# Patient Record
Sex: Female | Born: 1970
Health system: Southern US, Community
[De-identification: ages and names within clinical notes are randomized; demographics above are authoritative.]

## PROBLEM LIST (undated history)

## (undated) DIAGNOSIS — M2550 Pain in unspecified joint: Secondary | ICD-10-CM

## (undated) DIAGNOSIS — E669 Obesity, unspecified: Secondary | ICD-10-CM

## (undated) DIAGNOSIS — I10 Essential (primary) hypertension: Secondary | ICD-10-CM

## (undated) DIAGNOSIS — E559 Vitamin D deficiency, unspecified: Secondary | ICD-10-CM

## (undated) DIAGNOSIS — C569 Malignant neoplasm of unspecified ovary: Secondary | ICD-10-CM

## (undated) DIAGNOSIS — I639 Cerebral infarction, unspecified: Secondary | ICD-10-CM

## (undated) DIAGNOSIS — F32A Depression, unspecified: Secondary | ICD-10-CM

## (undated) DIAGNOSIS — G8190 Hemiplegia, unspecified affecting unspecified side: Secondary | ICD-10-CM

## (undated) DIAGNOSIS — F419 Anxiety disorder, unspecified: Secondary | ICD-10-CM

## (undated) DIAGNOSIS — T7840XA Allergy, unspecified, initial encounter: Secondary | ICD-10-CM

## (undated) DIAGNOSIS — Z5189 Encounter for other specified aftercare: Secondary | ICD-10-CM

## (undated) DIAGNOSIS — G709 Myoneural disorder, unspecified: Secondary | ICD-10-CM

## (undated) DIAGNOSIS — F329 Major depressive disorder, single episode, unspecified: Secondary | ICD-10-CM

## (undated) DIAGNOSIS — J189 Pneumonia, unspecified organism: Secondary | ICD-10-CM

## (undated) DIAGNOSIS — M549 Dorsalgia, unspecified: Secondary | ICD-10-CM

## (undated) DIAGNOSIS — R7303 Prediabetes: Secondary | ICD-10-CM

## (undated) DIAGNOSIS — M1990 Unspecified osteoarthritis, unspecified site: Secondary | ICD-10-CM

## (undated) DIAGNOSIS — G4730 Sleep apnea, unspecified: Secondary | ICD-10-CM

## (undated) DIAGNOSIS — Z91018 Allergy to other foods: Secondary | ICD-10-CM

## (undated) DIAGNOSIS — E785 Hyperlipidemia, unspecified: Secondary | ICD-10-CM

## (undated) DIAGNOSIS — D649 Anemia, unspecified: Secondary | ICD-10-CM

## (undated) DIAGNOSIS — E119 Type 2 diabetes mellitus without complications: Secondary | ICD-10-CM

## (undated) DIAGNOSIS — G43909 Migraine, unspecified, not intractable, without status migrainosus: Secondary | ICD-10-CM

## (undated) HISTORY — DX: Anemia, unspecified: D64.9

## (undated) HISTORY — DX: Anxiety disorder, unspecified: F41.9

## (undated) HISTORY — DX: Unspecified osteoarthritis, unspecified site: M19.90

## (undated) HISTORY — DX: Sleep apnea, unspecified: G47.30

## (undated) HISTORY — DX: Allergy, unspecified, initial encounter: T78.40XA

## (undated) HISTORY — PX: INGUINAL HERNIA REPAIR: SHX194

## (undated) HISTORY — DX: Dorsalgia, unspecified: M54.9

## (undated) HISTORY — DX: Encounter for other specified aftercare: Z51.89

## (undated) HISTORY — DX: Pain in unspecified joint: M25.50

## (undated) HISTORY — DX: Malignant neoplasm of unspecified ovary: C56.9

## (undated) HISTORY — DX: Obesity, unspecified: E66.9

## (undated) HISTORY — PX: HERNIA REPAIR: SHX51

## (undated) HISTORY — DX: Essential (primary) hypertension: I10

## (undated) HISTORY — DX: Allergy to other foods: Z91.018

## (undated) HISTORY — DX: Prediabetes: R73.03

## (undated) HISTORY — PX: UMBILICAL HERNIA REPAIR: SHX196

## (undated) HISTORY — DX: Depression, unspecified: F32.A

## (undated) HISTORY — DX: Vitamin D deficiency, unspecified: E55.9

## (undated) HISTORY — DX: Hemiplegia, unspecified affecting unspecified side: G81.90

## (undated) HISTORY — DX: Myoneural disorder, unspecified: G70.9

## (undated) HISTORY — DX: Migraine, unspecified, not intractable, without status migrainosus: G43.909

## (undated) HISTORY — DX: Type 2 diabetes mellitus without complications: E11.9

## (undated) HISTORY — DX: Cerebral infarction, unspecified: I63.9

## (undated) HISTORY — DX: Pneumonia, unspecified organism: J18.9

---
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
## 1999-05-09 ENCOUNTER — Emergency Department (HOSPITAL_COMMUNITY): Admission: EM | Admit: 1999-05-09 | Discharge: 1999-05-09 | Payer: Self-pay | Admitting: Emergency Medicine

## 1999-07-21 ENCOUNTER — Encounter: Payer: Self-pay | Admitting: Internal Medicine

## 1999-07-21 ENCOUNTER — Emergency Department (HOSPITAL_COMMUNITY): Admission: EM | Admit: 1999-07-21 | Discharge: 1999-07-21 | Payer: Self-pay | Admitting: Internal Medicine

## 2000-11-11 ENCOUNTER — Emergency Department (HOSPITAL_COMMUNITY): Admission: EM | Admit: 2000-11-11 | Discharge: 2000-11-11 | Payer: Self-pay | Admitting: Emergency Medicine

## 2001-05-29 ENCOUNTER — Emergency Department (HOSPITAL_COMMUNITY): Admission: EM | Admit: 2001-05-29 | Discharge: 2001-05-29 | Payer: Self-pay | Admitting: Emergency Medicine

## 2003-02-04 ENCOUNTER — Encounter: Payer: Self-pay | Admitting: Emergency Medicine

## 2003-02-04 ENCOUNTER — Inpatient Hospital Stay (HOSPITAL_COMMUNITY): Admission: EM | Admit: 2003-02-04 | Discharge: 2003-02-05 | Payer: Self-pay | Admitting: Emergency Medicine

## 2003-09-25 ENCOUNTER — Emergency Department (HOSPITAL_COMMUNITY): Admission: AD | Admit: 2003-09-25 | Discharge: 2003-09-25 | Payer: Self-pay | Admitting: Family Medicine

## 2003-12-18 ENCOUNTER — Emergency Department (HOSPITAL_COMMUNITY): Admission: EM | Admit: 2003-12-18 | Discharge: 2003-12-18 | Payer: Self-pay | Admitting: Emergency Medicine

## 2005-06-08 ENCOUNTER — Inpatient Hospital Stay (HOSPITAL_COMMUNITY): Admission: AD | Admit: 2005-06-08 | Discharge: 2005-06-08 | Payer: Self-pay | Admitting: Obstetrics and Gynecology

## 2005-06-08 IMAGING — US US TRANSVAGINAL NON-OB
1 series · 13 of 25 positions shown · non-contrast
Comparison: none

CLINICAL DATA: Enlarged uterus with pain and vaginal discharge.  LMP [DATE].
TRANSABDOMINAL AND ENDOVAGINAL PELVIC ULTRASOUND:
Multiple images of the uterus and adnexa were obtained using a transabdominal and endovaginal approaches. 
The uterus demonstrates a straight anterior/posterior position on endovaginal exam making evaluation of the uterus transvaginally suboptimal.  
Transabdominal evaluation of the uterus reveals a sagittal length of 13.5, AP width of 6.5 cm and a transverse width of 6.3 cm.  The upper uterine segment portion of the uterus appears somewhat bulbous and the myometrium appears diffusely inhomogeneous.  These findings suggest the possibility of underlying adenomyosis.  No discrete measurable areas of altered echotexture was seen transabdominally to suggest fibroids.  Again, the myometrium was poorly assessed endovaginally.  
The endometrial canal is homogeneously echogenic with an AP width of 8 mm.  No focal thickening or inhomogeniety was seen transabdominally and the appearance would correlate with a presecretory stripe and the patient?s given LMP of [DATE].  The endometrium could not be assessed endovaginally.  
Both ovaries were well-seen transabdominally and have a normal appearance with the right ovary measuring 3.2 x 1.4 x 1.9 cm and the left ovary measuring 3.2 x 1.7 x 2.6 cm.  A physiologic amount of simple free fluid was noted in the cul-de-sac.  No separate adnexal masses are noted.  
Because of the prominent uterine size, both kidneys were evaluated and these demonstrate no signs of associated hydronephrosis.

[Series 1: us transvaginal non-ob · 0.33mm/px · 13 of 47 slices shown]
[im 1/47]
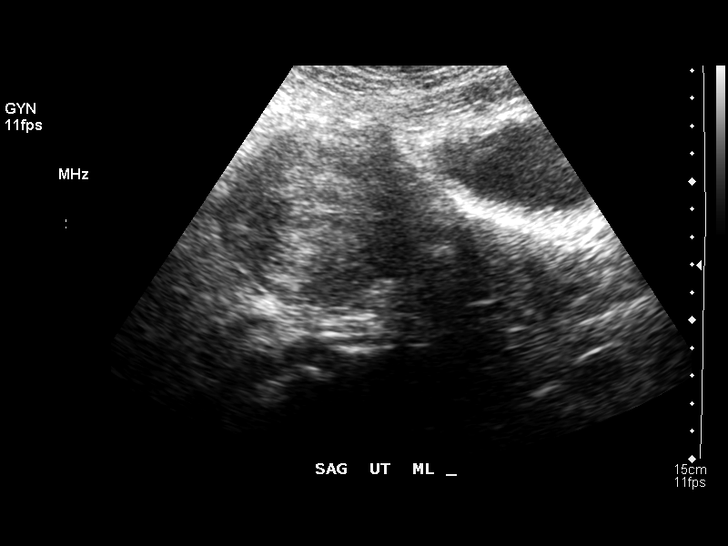
[im 4/47]
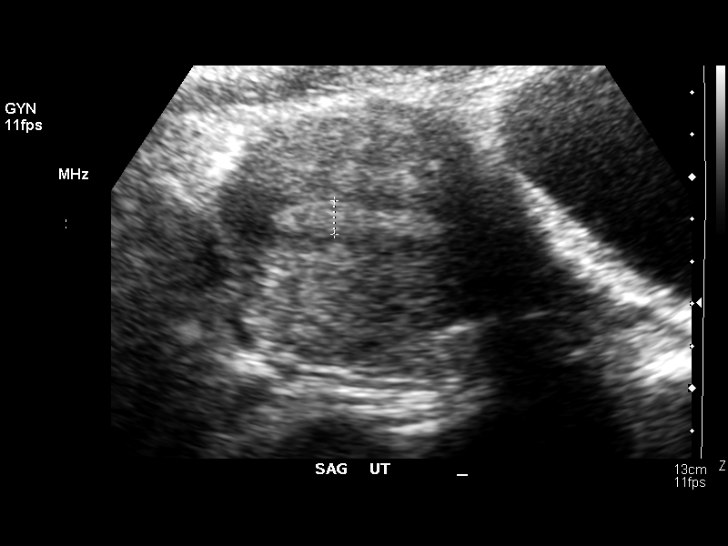
[im 8/47]
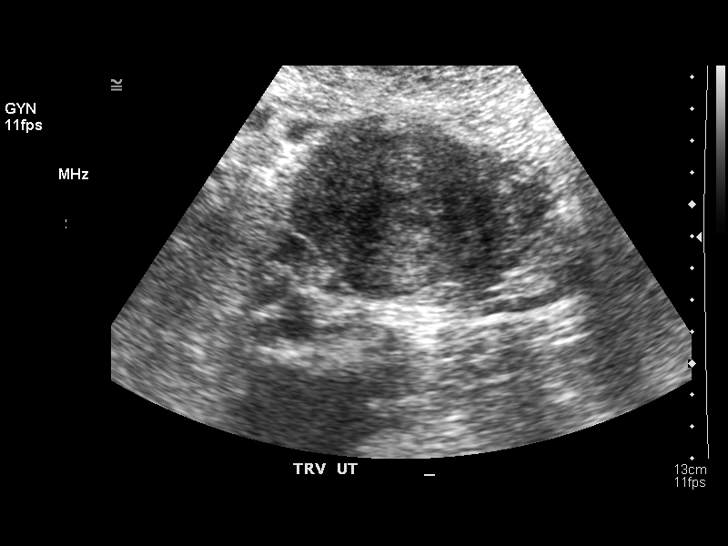
[im 12/47]
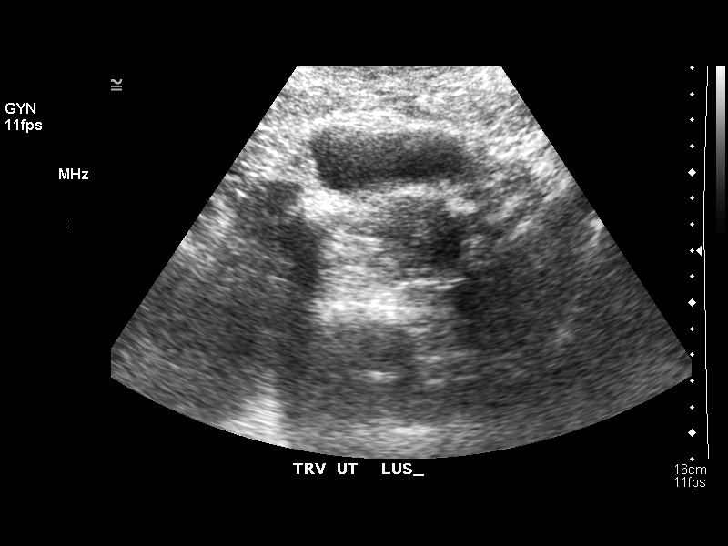
[im 16/47]
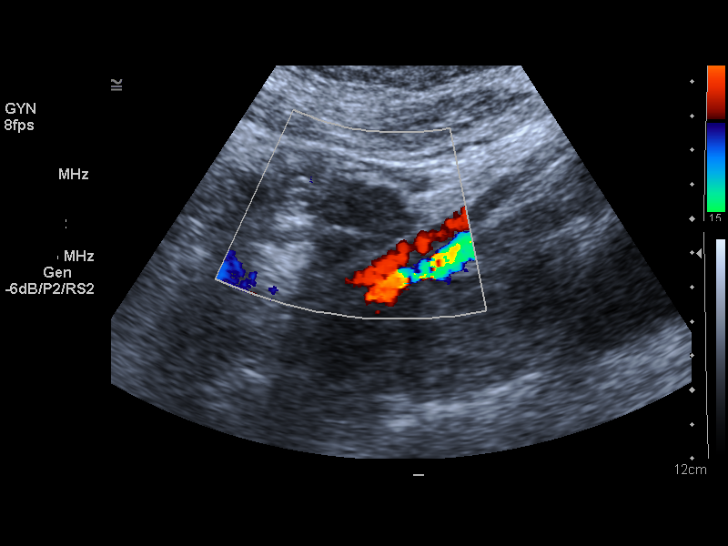
[im 20/47]
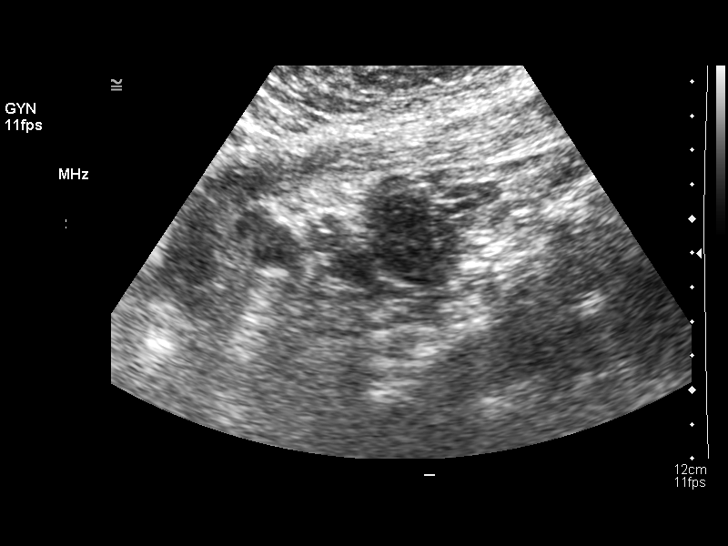
[im 24/47]
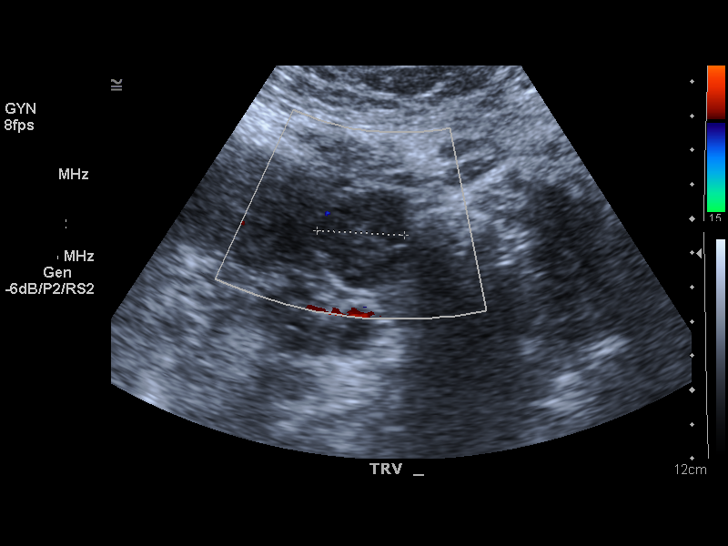
[im 27/47]
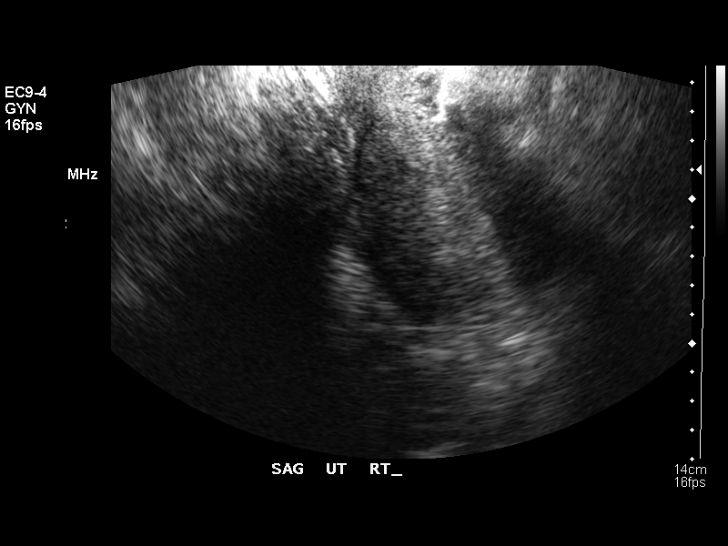
[im 31/47]
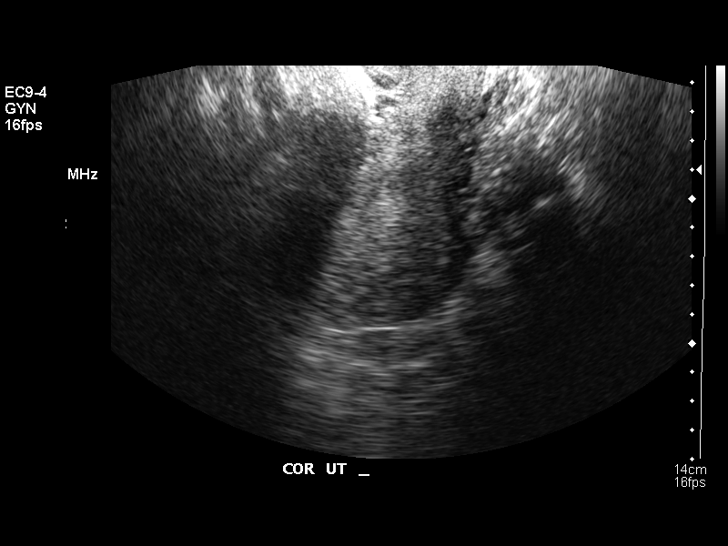
[im 35/47]
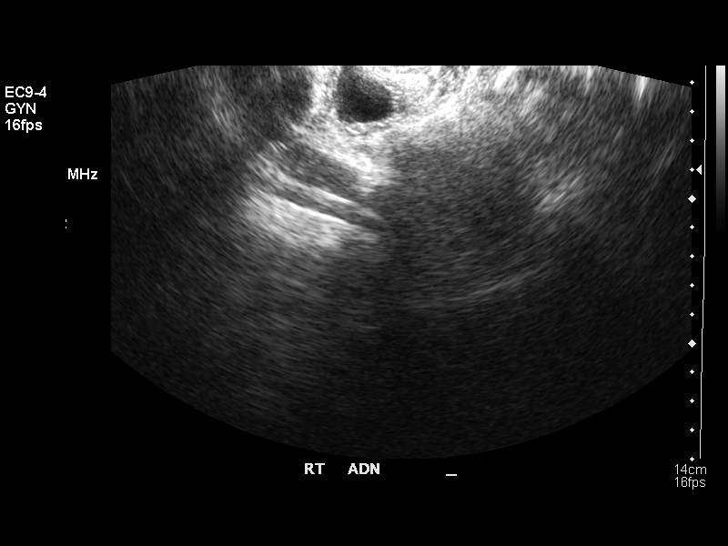
[im 39/47]
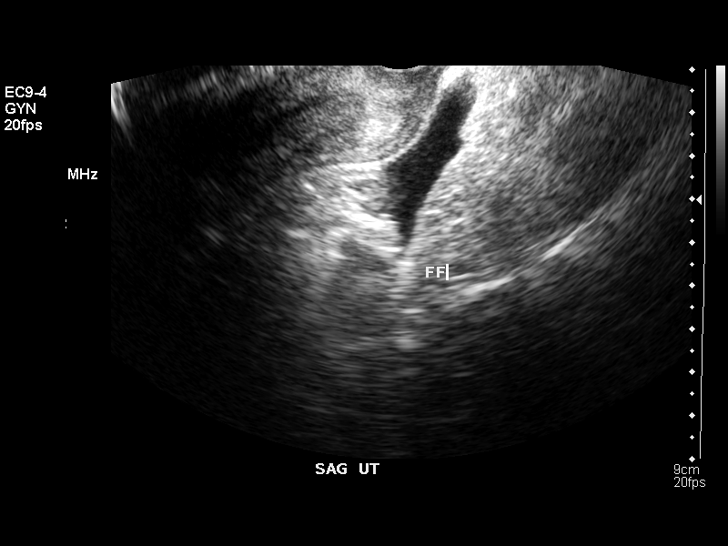
[im 43/47]
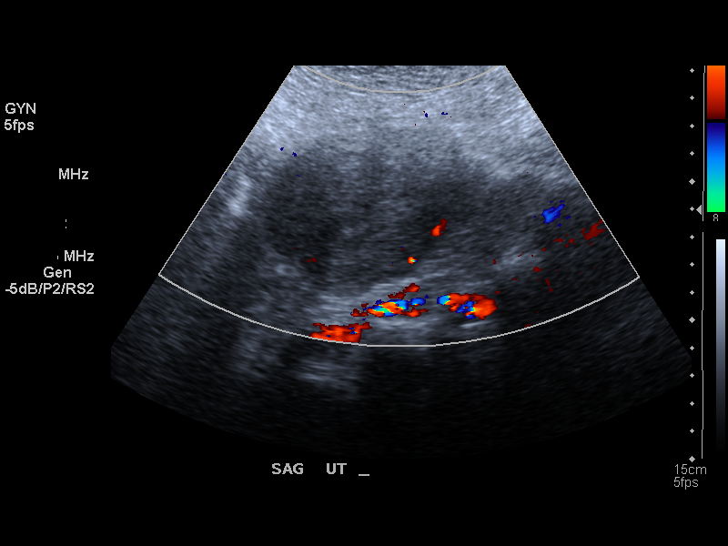
[im 47/47]
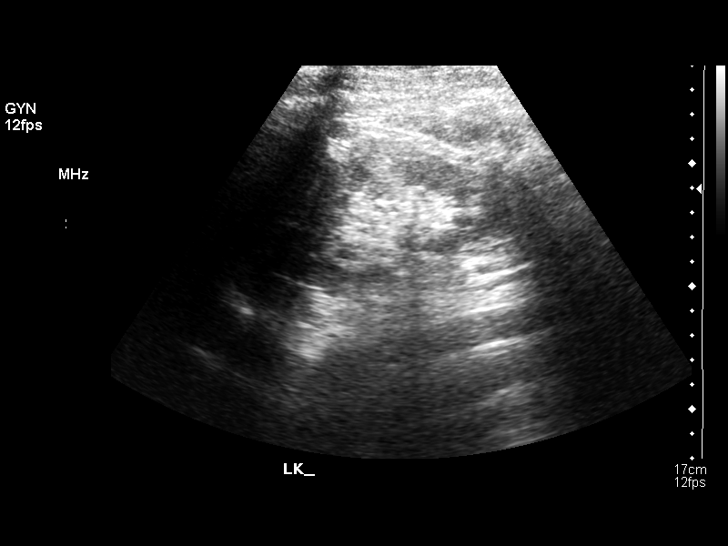

[13 of 25 positions shown; findings below may reference images not displayed]

IMPRESSION: 1.  Enlarged uterus with transabdominal findings raising the possibility of adenomyosis. 
2.  Normal endometrium and ovaries identified transabdominally.  The endovaginal exam was suboptimal due to positioning of the uterus on endovaginal exam.

## 2005-07-02 ENCOUNTER — Ambulatory Visit: Payer: Self-pay | Admitting: Obstetrics and Gynecology

## 2005-07-24 ENCOUNTER — Inpatient Hospital Stay (HOSPITAL_COMMUNITY): Admission: AD | Admit: 2005-07-24 | Discharge: 2005-07-24 | Payer: Self-pay | Admitting: Obstetrics and Gynecology

## 2005-07-24 IMAGING — US US OB COMP LESS 14 WK
1 series · 14 of 28 positions shown · non-contrast
Comparison: [DATE].

CLINICAL DATA: Abdominal pain. Positive urine pregnancy test. Beta-hCG pending.

COMPLETE OBSTETRICAL ULTRASOUND LESS THAN 14 WEEKS AND TRANSVAGINAL OBSTETRICAL
ULTRASOUND

[Series 1: us ob comp less 14 wk · 0.29mm/px · 14 of 28 slices shown]
[im 2/28]
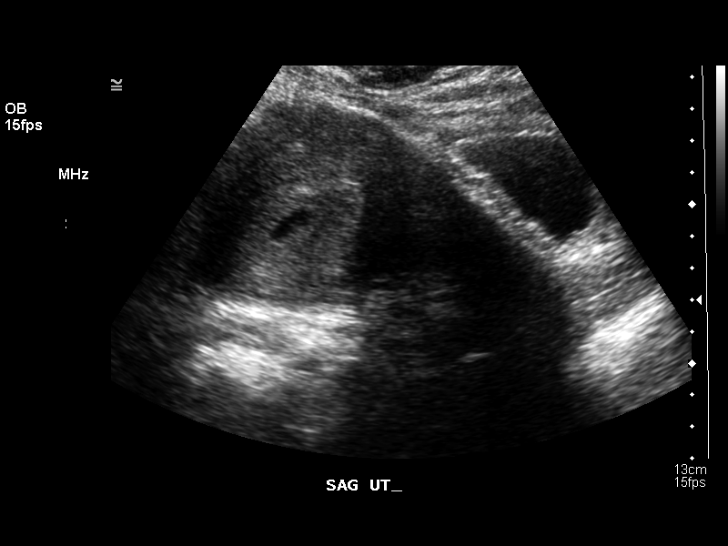
[im 4/28]
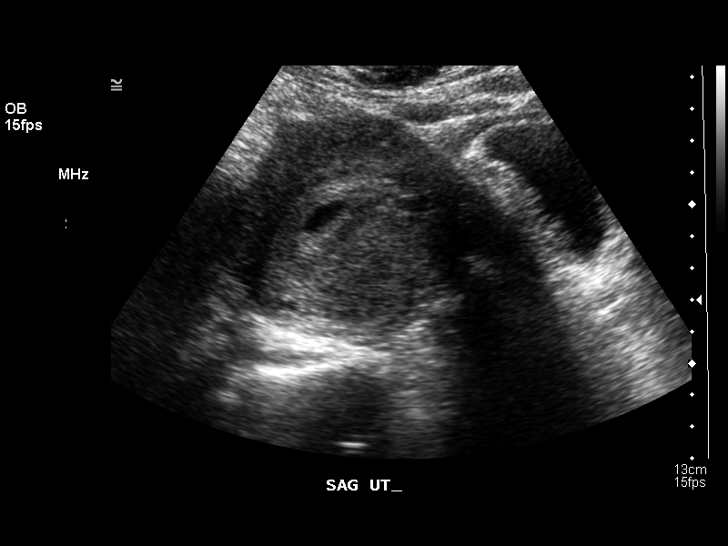
[im 6/28]
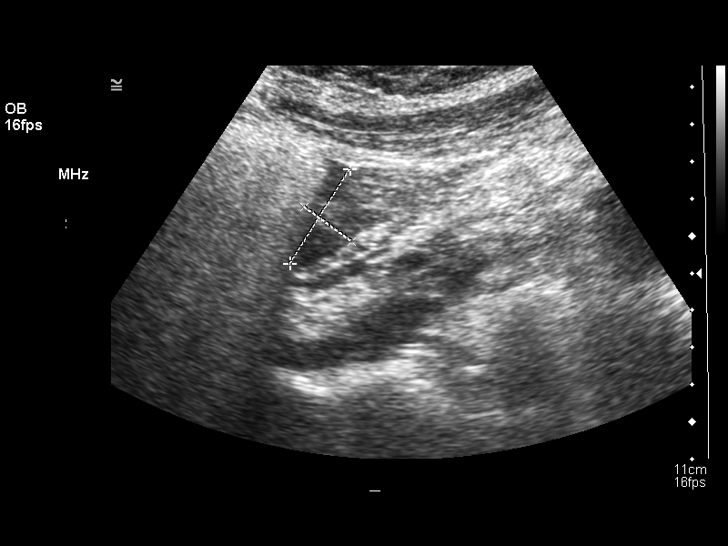
[im 8/28]
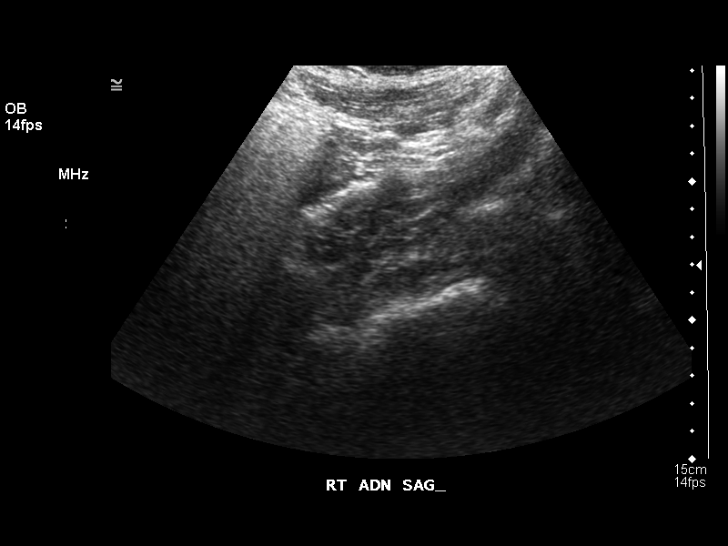
[im 10/28]
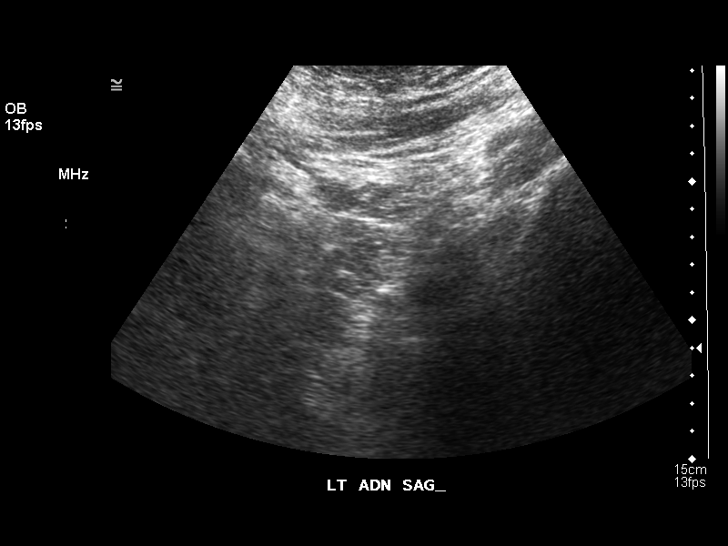
[im 12/28]
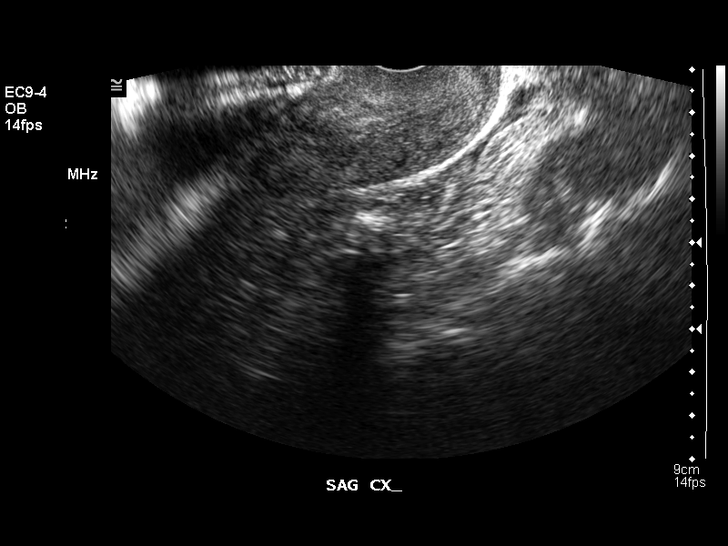
[im 14/28]
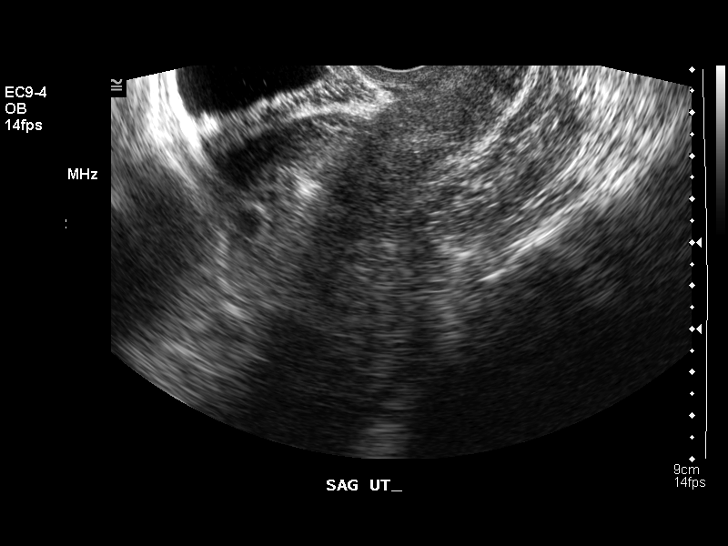
[im 16/28]
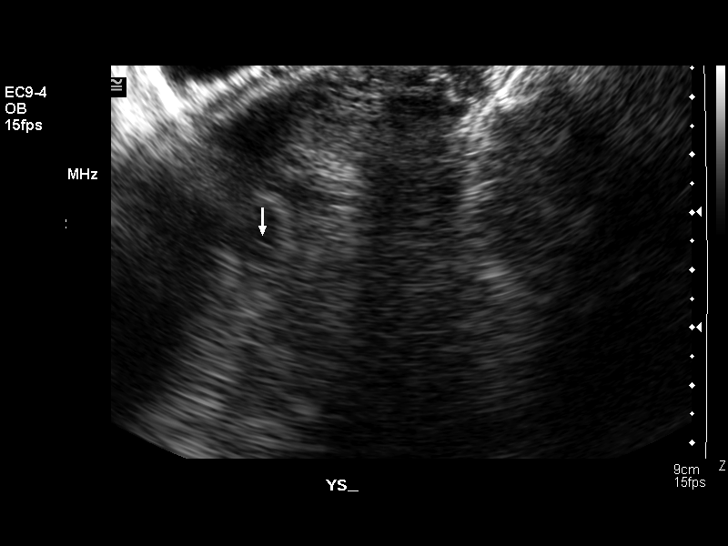
[im 18/28]
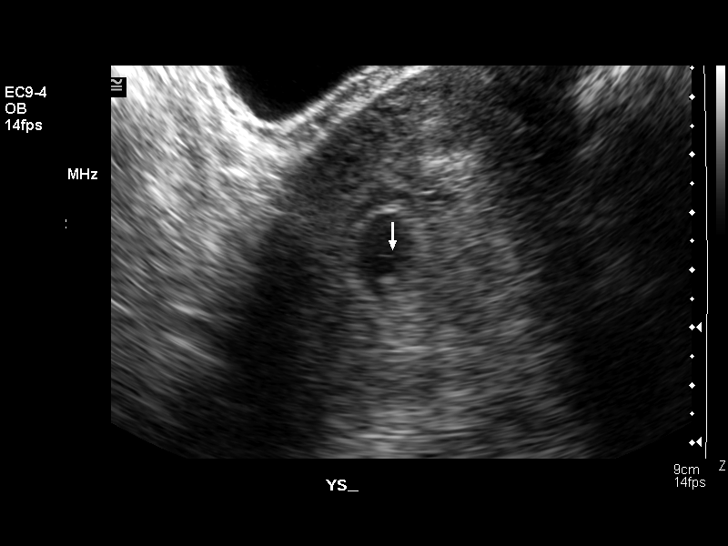
[im 20/28]
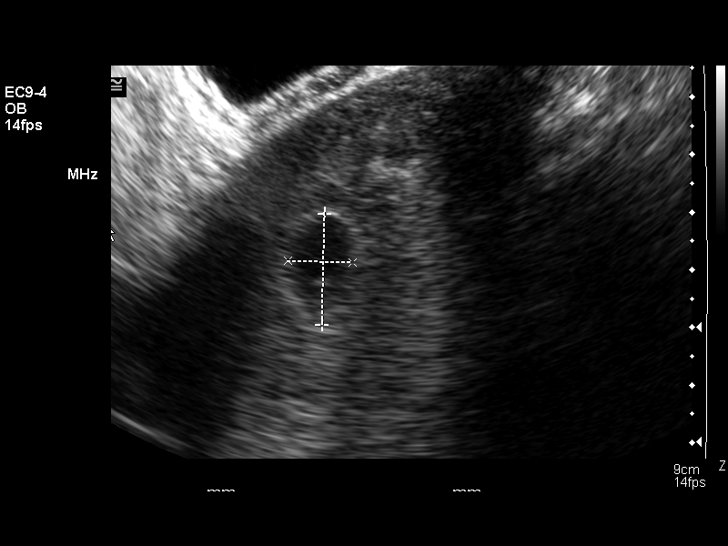
[im 22/28]
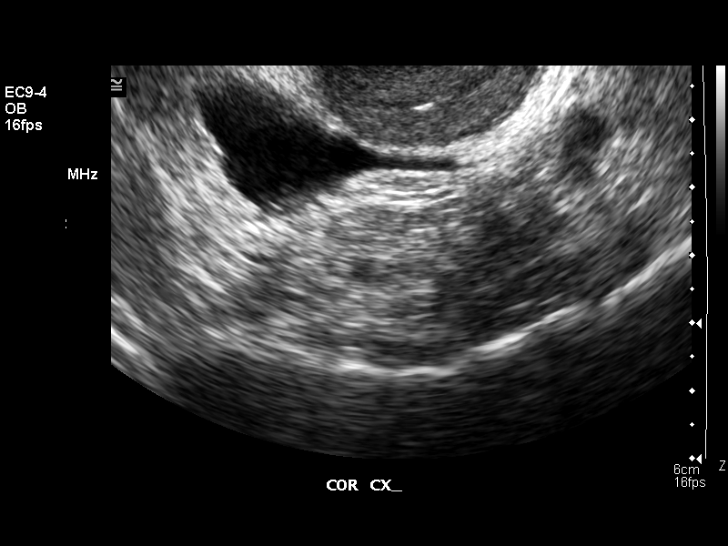
[im 24/28]
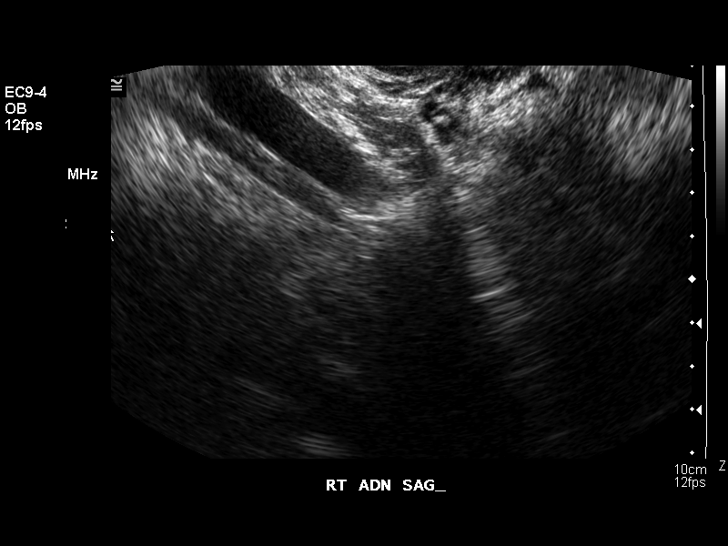
[im 26/28]
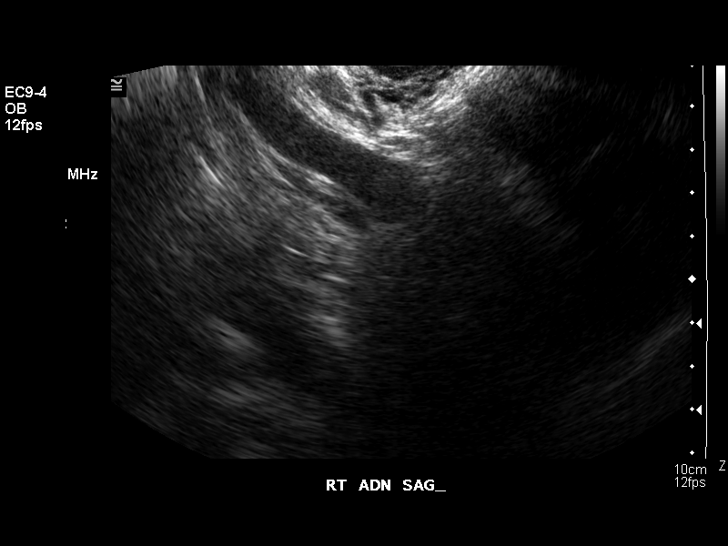
[im 28/28]
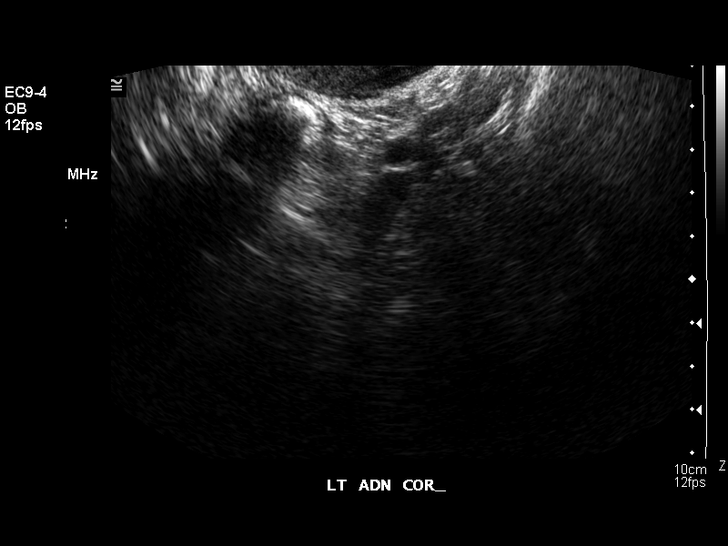

[14 of 28 positions shown; findings below may reference images not displayed]

FINDINGS: Intrauterine gestational sac containing a yolk sac with no visible
fetal pole. The mean sac diameter is 15.1 mm, giving an estimated gestational
age of 6 weeks and 2 days. Normal appearing right ovary. Nonvisualized left
ovary. Small amount of free peritoneal fluid. No subchorionic hemorrhage seen.

IMPRESSION

1. Intrauterine gestational sac containing a yolk sac and no visible fetal pole
at this time. The estimated gestational age by sac measurements is 6 weeks and 2
days. This could represent a normal early intrauterine pregnancy or blighted
ovum. Followup serial quantitative beta-hCG determinations is recommended and,
if clinically indicated, followup ultrasound.

2. Nonvisualized left ovary.

3. Small amount of free peritoneal fluid.

## 2005-10-02 ENCOUNTER — Inpatient Hospital Stay (HOSPITAL_COMMUNITY): Admission: AD | Admit: 2005-10-02 | Discharge: 2005-10-02 | Payer: Self-pay | Admitting: Obstetrics and Gynecology

## 2005-10-02 IMAGING — US US OB LIMITED
1 series · 14 of 28 positions shown · non-contrast
Comparison: none

CLINICAL DATA: 33-year-old with lower abdominal pain increased today, question of preterm labor.  Patient is 16 weeks 2 days by assigned EDC of [DATE].

[Series 1: us ob limited · 0.33mm/px · 14 of 46 slices shown]
[im 2/46]
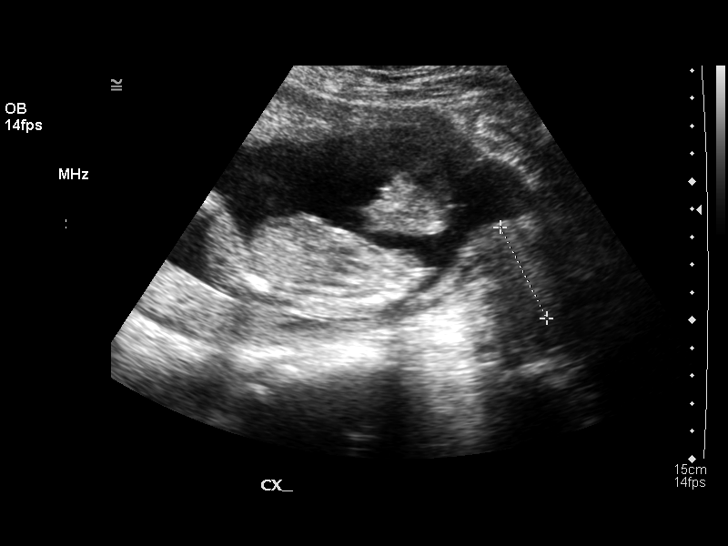
[im 6/46]
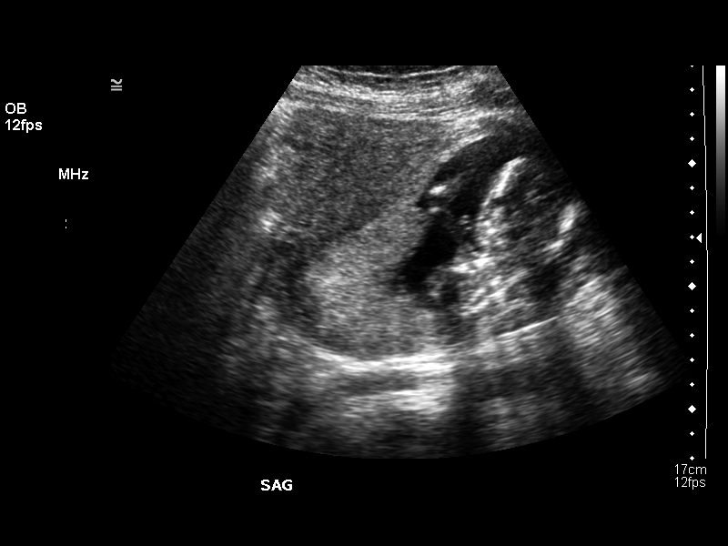
[im 9/46]
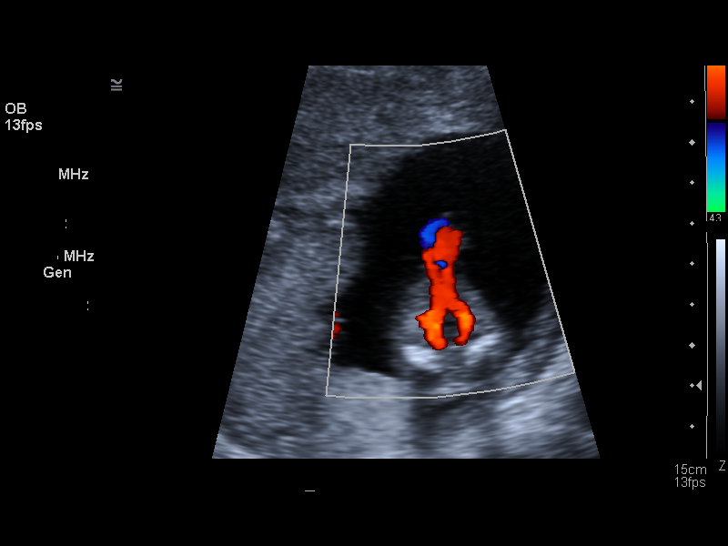
[im 12/46]
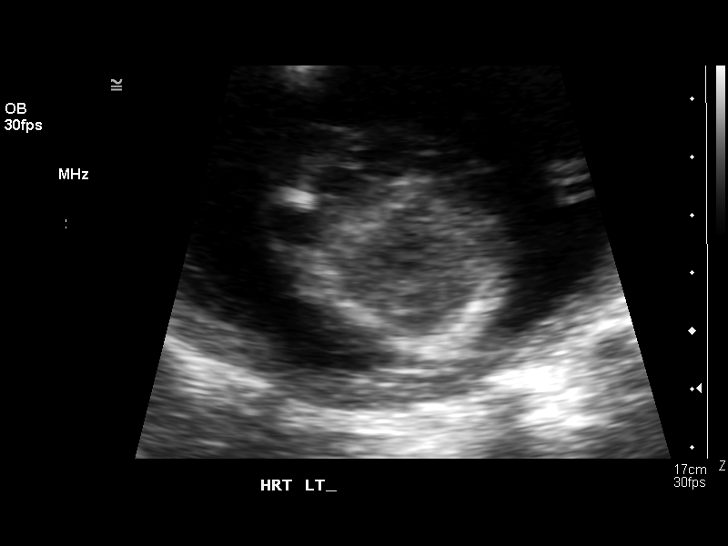
[im 16/46]
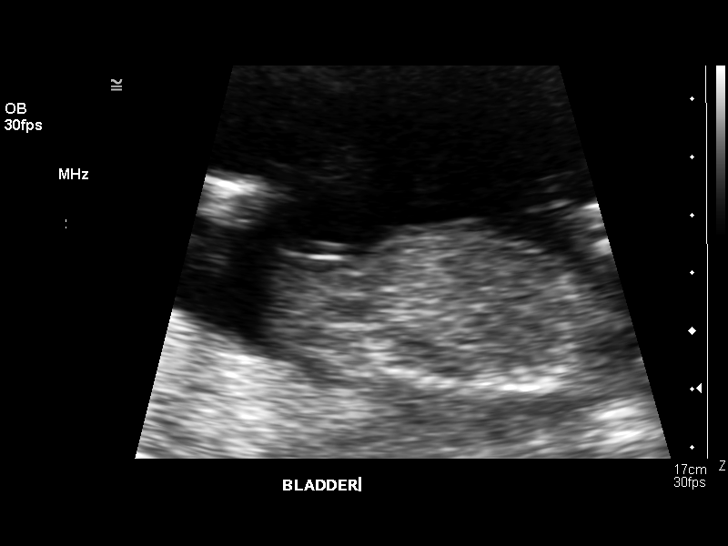
[im 19/46]
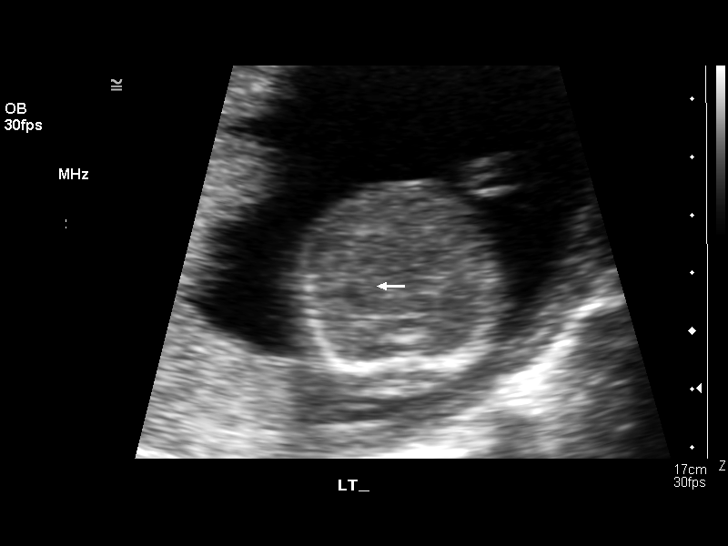
[im 22/46]
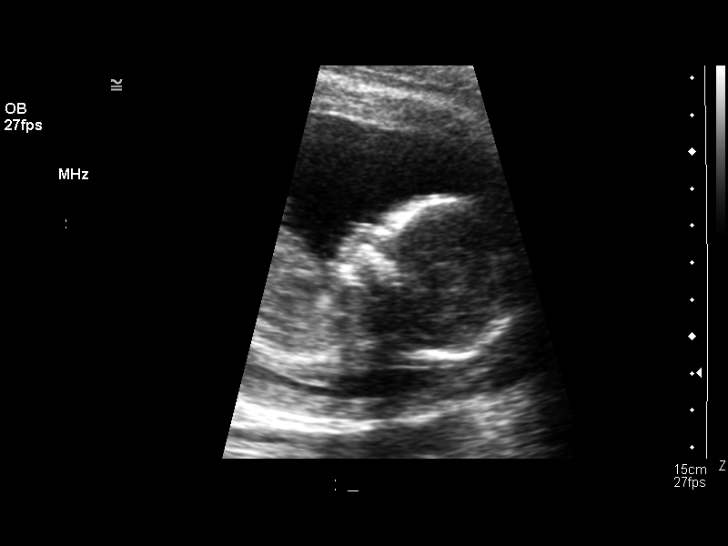
[im 26/46]
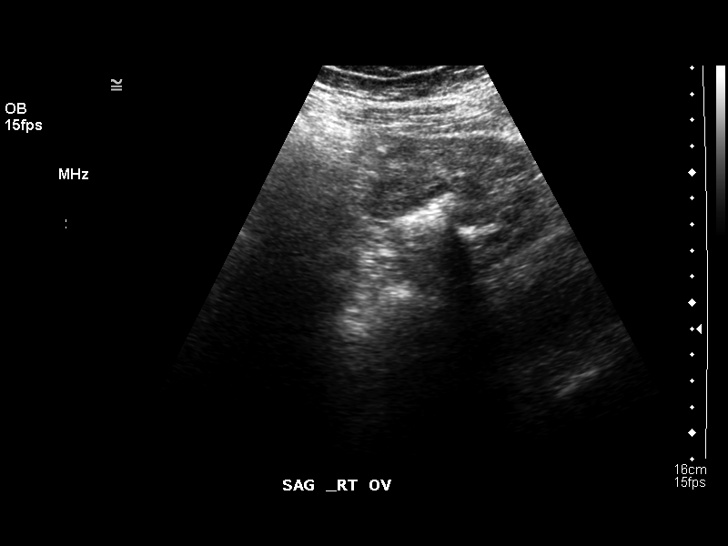
[im 29/46]
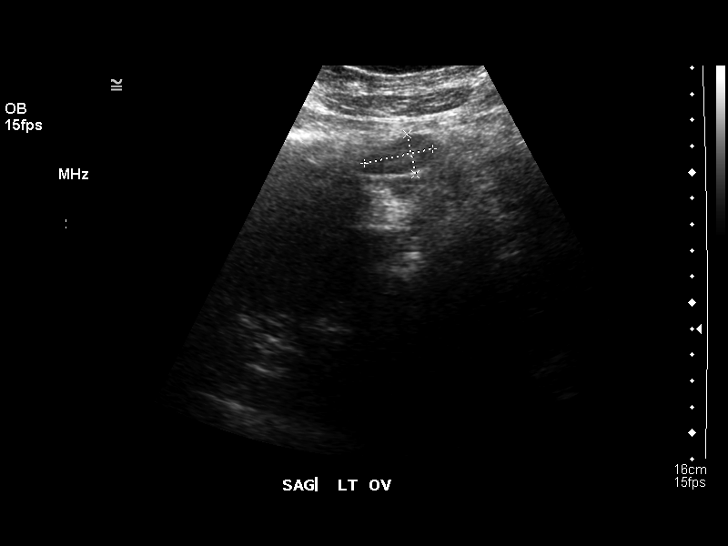
[im 32/46]
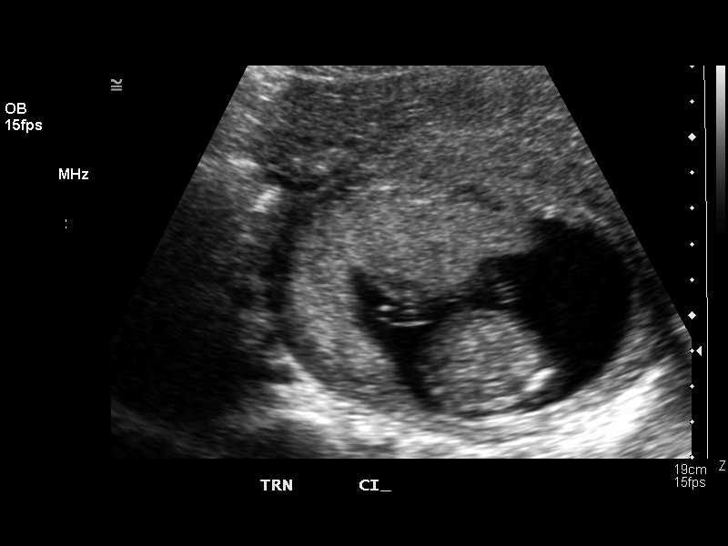
[im 36/46]
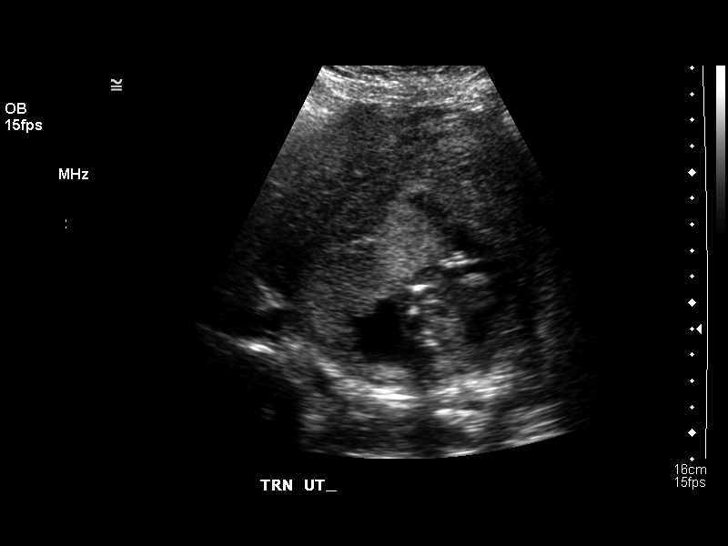
[im 39/46]
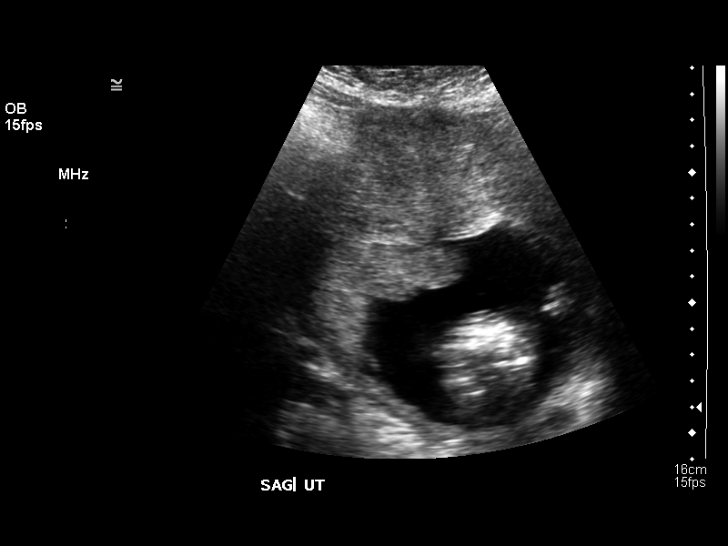
[im 42/46]
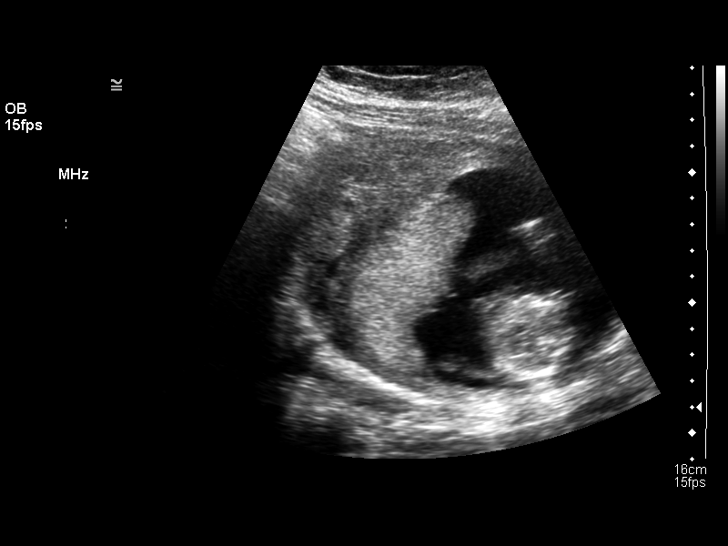
[im 46/46]
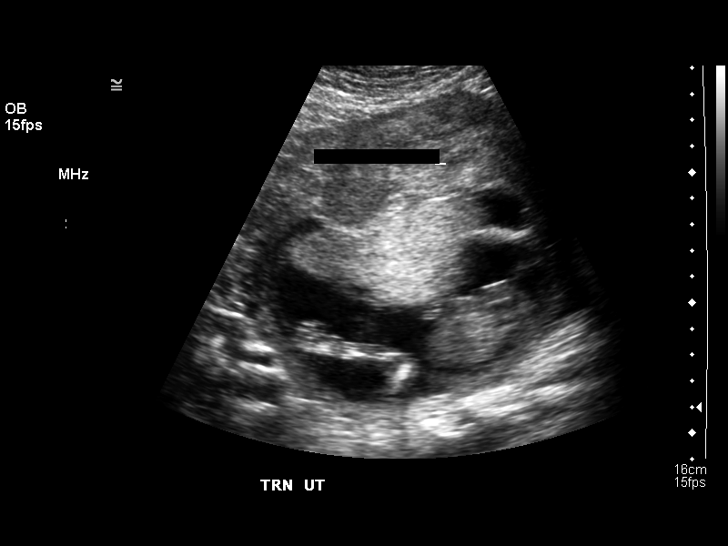

[14 of 28 positions shown; findings below may reference images not displayed]

LIMITED OBSTETRICAL ULTRASOUND:
 Number of Fetuses:  1
 Heart Rate:  150 beats per minute 
 Movement:  Yes
 Breathing:  Yes
 Presentation:  Cephalic
 Placental Location:  Fundal 
 Grade:  1
 Previa:  No
 Amniotic Fluid (Subjective):  Normal
 Amniotic Fluid (Objective):  Vertical pocket 4.0 cm

 Fetal measurements and complete anatomic evaluation were not requested.  The following fetal anatomy was visualized during this exam:  Lateral ventricles, stomach on the left, three vessel cord, kidneys, bladder and diaphragm.

 MATERNAL UTERINE AND ADNEXAL FINDINGS
 Cervix:  3.7 cm transabdominally
 Normal ovaries
 Note is made of fundal uterine contraction.
IMPRESSION: Single living intrauterine fetus in cephalic presentation.  Amniotic fluid volume and cervical length are normal in appearance.

## 2005-10-20 ENCOUNTER — Inpatient Hospital Stay (HOSPITAL_COMMUNITY): Admission: AD | Admit: 2005-10-20 | Discharge: 2005-10-20 | Payer: Self-pay | Admitting: Obstetrics and Gynecology

## 2005-10-20 IMAGING — US US ABDOMEN COMPLETE
1 series · 14 of 25 positions shown · non-contrast
Comparison: none

CLINICAL DATA: 33-year-old female [REDACTED] weeks pregnant with left upper quadrant abdominal pain.  
ABDOMEN ULTRASOUND:
TECHNIQUE: Complete abdominal ultrasound examination was performed including evaluation of the liver, gallbladder, bile ducts, pancreas, kidneys, spleen, IVC, and abdominal aorta.

[Series 1: us abdomen complete · 0.35mm/px · 14 of 71 slices shown]
[im 1/71]
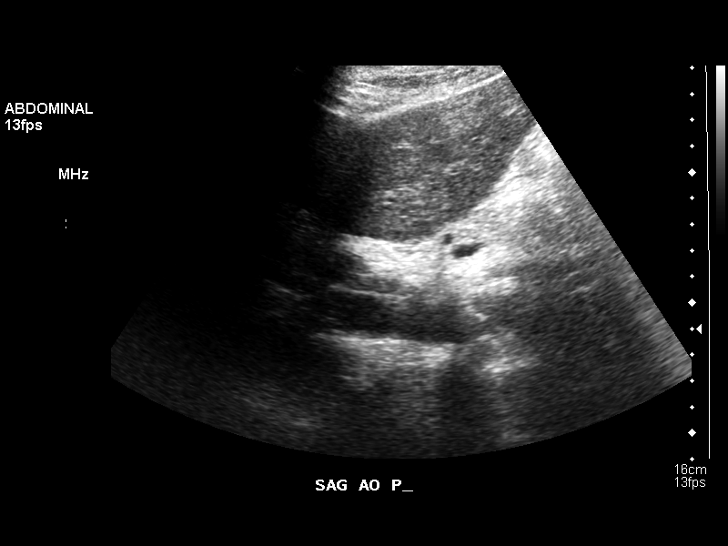
[im 6/71]
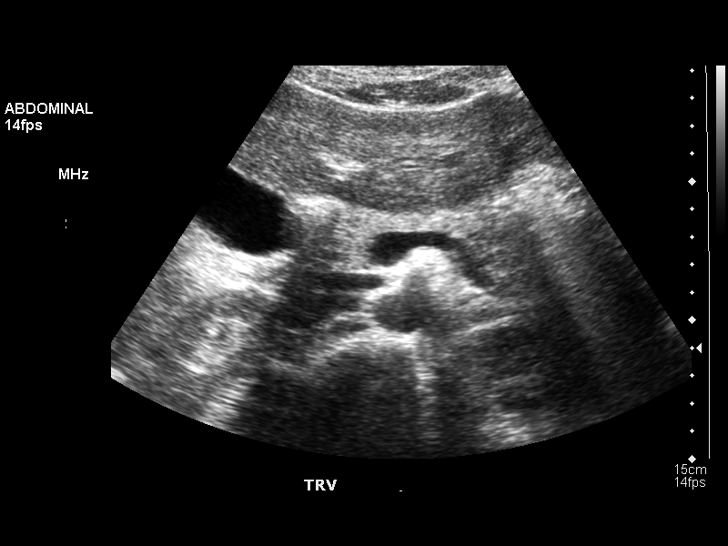
[im 12/71]
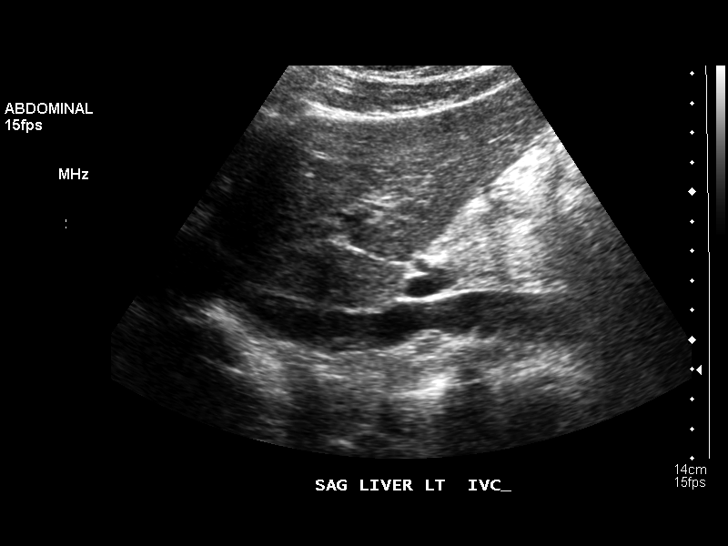
[im 18/71]
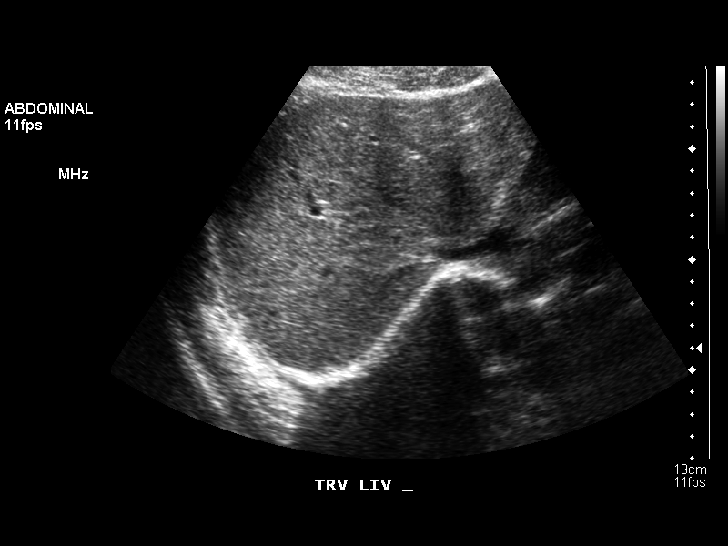
[im 24/71]
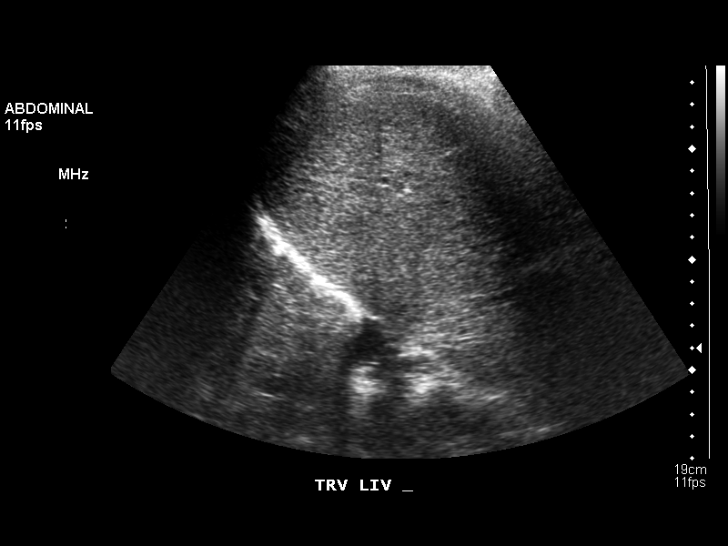
[im 27/71]
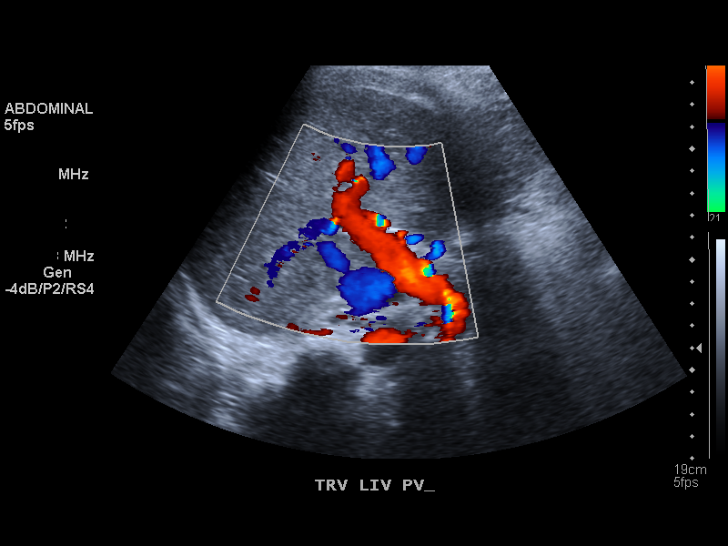
[im 33/71]
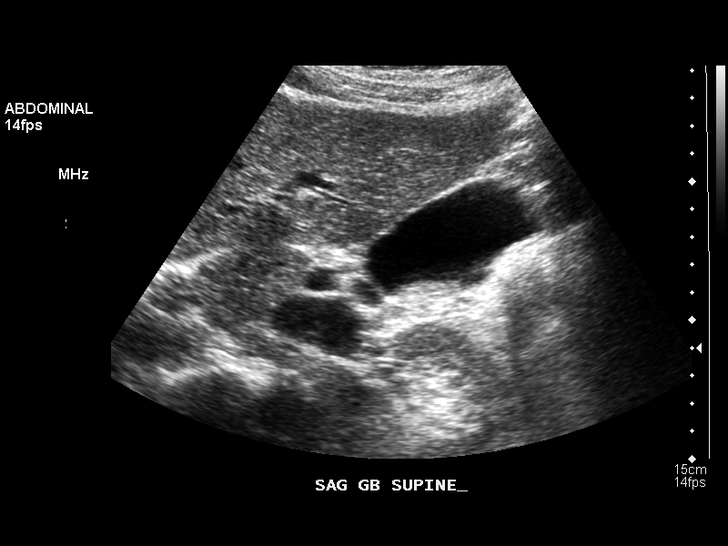
[im 38/71]
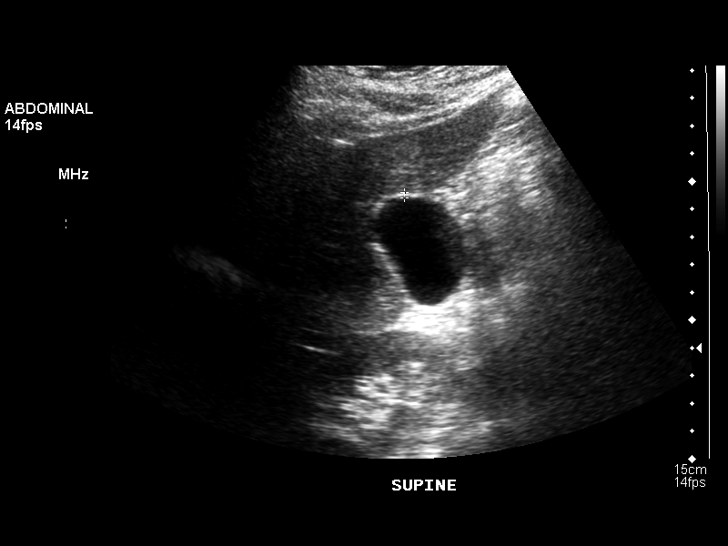
[im 44/71]
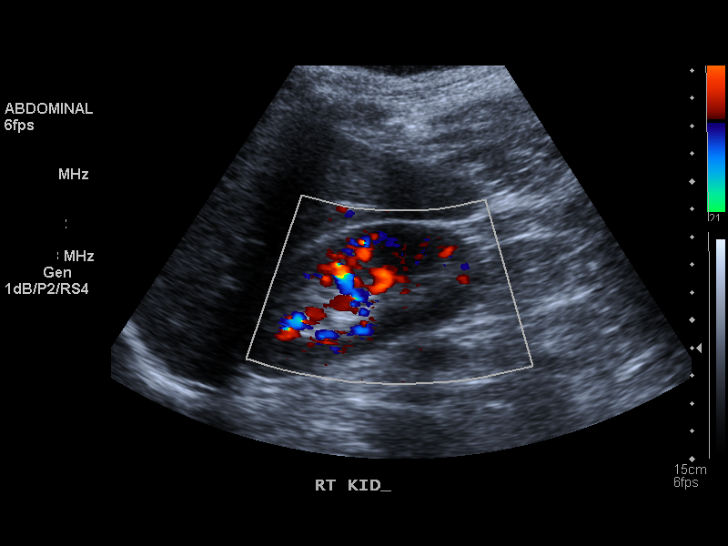
[im 47/71]
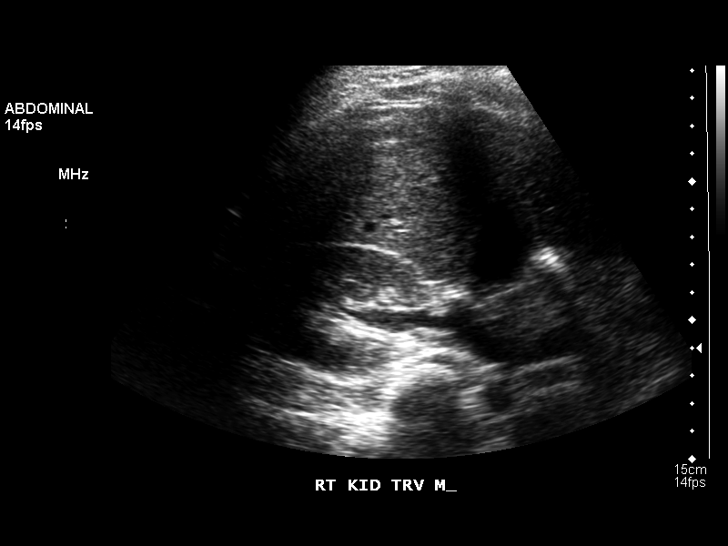
[im 53/71]
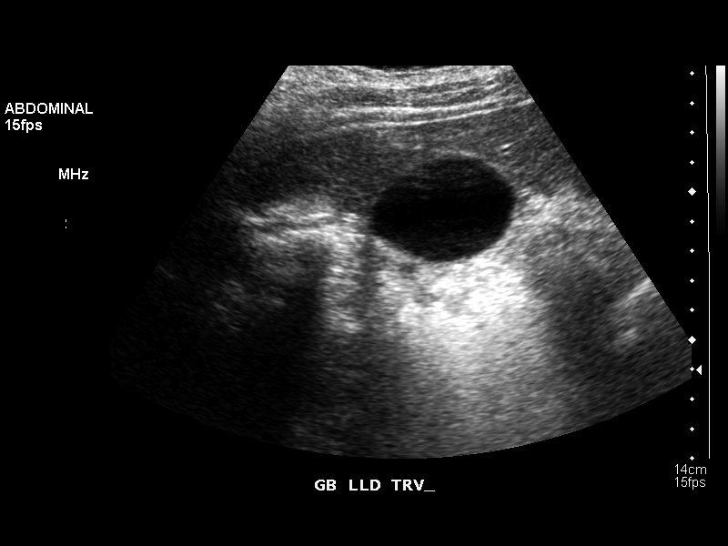
[im 59/71]
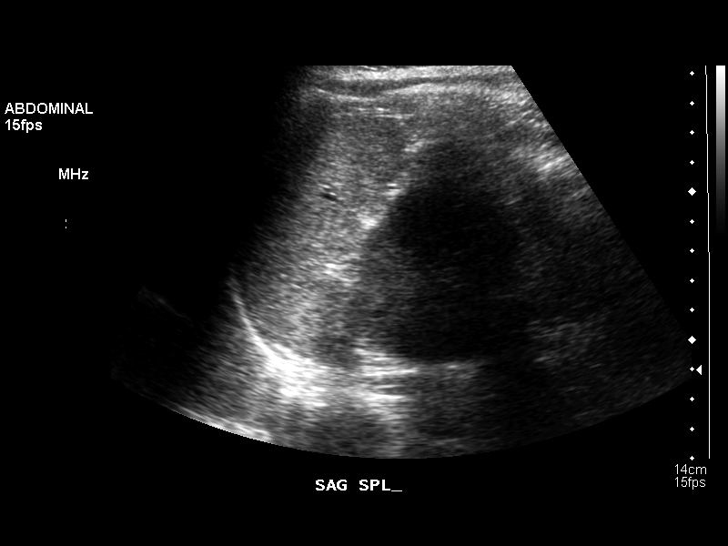
[im 65/71]
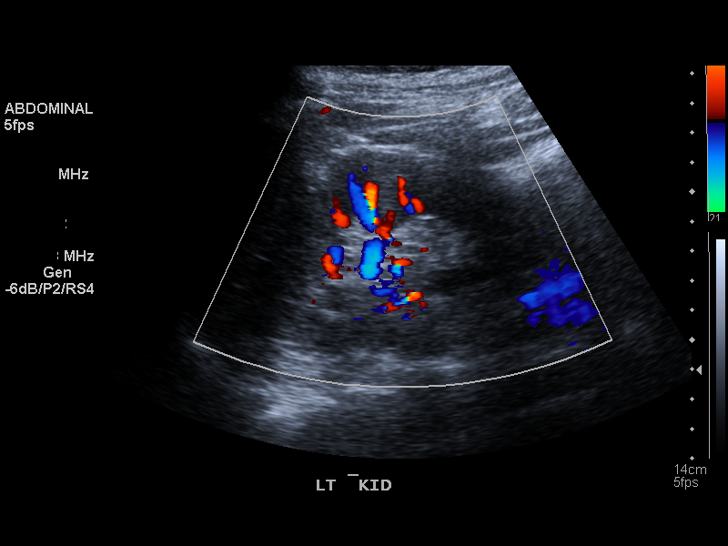
[im 71/71]
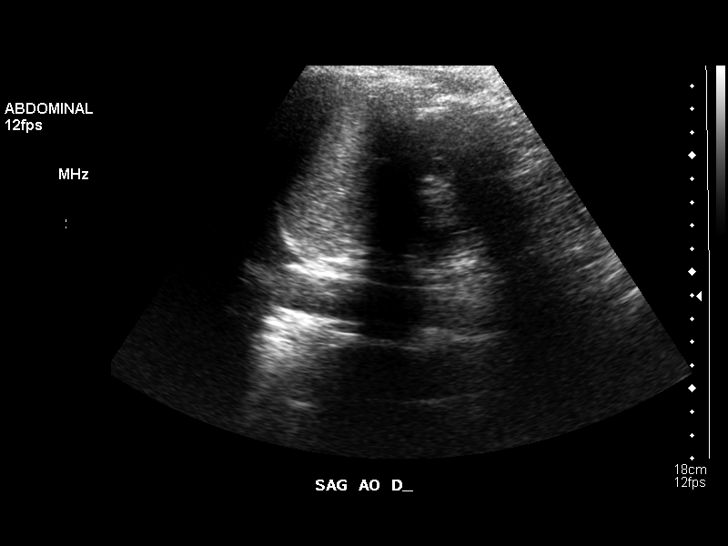

[14 of 25 positions shown; findings below may reference images not displayed]

FINDINGS: There is no evidence of gallstones or biliary ductal dilatation.  Gallbladder wall thickness is normal at 2 mm.  Common bile duct diameter is 3.4 mm.  The liver is within normal limits in echogenicity, and no focal liver lesions are seen.  The visualized portions of the IVC and pancreas are unremarkable.
There is no evidence of splenomegaly.  The spleen measures 8.7 cm in length.  The kidneys are unremarkable, and there is no evidence of hydronephrosis.  Right kidney measures 10.2 cm.  Left kidney measures 9.4 cm.  The abdominal aorta is non-dilated and has a maximal diameter of 2 cm.  No evidence of aneurysm or ascites.  No adenopathy appreciated.
IMPRESSION: No acute finding by abdominal ultrasound.

## 2005-12-19 ENCOUNTER — Inpatient Hospital Stay (HOSPITAL_COMMUNITY): Admission: AD | Admit: 2005-12-19 | Discharge: 2005-12-20 | Payer: Self-pay | Admitting: Obstetrics and Gynecology

## 2006-01-20 ENCOUNTER — Inpatient Hospital Stay (HOSPITAL_COMMUNITY): Admission: AD | Admit: 2006-01-20 | Discharge: 2006-01-21 | Payer: Self-pay | Admitting: *Deleted

## 2006-02-13 ENCOUNTER — Inpatient Hospital Stay (HOSPITAL_COMMUNITY): Admission: AD | Admit: 2006-02-13 | Discharge: 2006-02-14 | Payer: Self-pay | Admitting: *Deleted

## 2006-02-17 ENCOUNTER — Inpatient Hospital Stay (HOSPITAL_COMMUNITY): Admission: AD | Admit: 2006-02-17 | Discharge: 2006-02-21 | Payer: Self-pay | Admitting: *Deleted

## 2006-02-22 ENCOUNTER — Encounter: Admission: RE | Admit: 2006-02-22 | Discharge: 2006-03-24 | Payer: Self-pay | Admitting: Obstetrics and Gynecology

## 2006-03-25 ENCOUNTER — Encounter: Admission: RE | Admit: 2006-03-25 | Discharge: 2006-04-09 | Payer: Self-pay | Admitting: Obstetrics and Gynecology

## 2006-07-06 ENCOUNTER — Emergency Department (HOSPITAL_COMMUNITY): Admission: EM | Admit: 2006-07-06 | Discharge: 2006-07-06 | Payer: Self-pay | Admitting: Emergency Medicine

## 2007-01-17 ENCOUNTER — Emergency Department (HOSPITAL_COMMUNITY): Admission: EM | Admit: 2007-01-17 | Discharge: 2007-01-18 | Payer: Self-pay | Admitting: Emergency Medicine

## 2007-01-17 IMAGING — CT CT HEAD W/O CM
1 series · 16 of 30 positions shown, 20 images · non-contrast
Comparison: None.

CLINICAL DATA: 35 year old female; face and head pain, left-sided headache x 1 day.
 HEAD CT WITHOUT CONTRAST ? [DATE]:
TECHNIQUE: Contiguous axial CT images were obtained from the base of the skull through the vertex according to standard protocol without contrast.

[Series 2: head_seq 4.5 h45s st · axial · 0.43mm/px · z∈[-151,-25]mm · 16 of 32 slices shown, 20 images]
[im 2/32  brain]
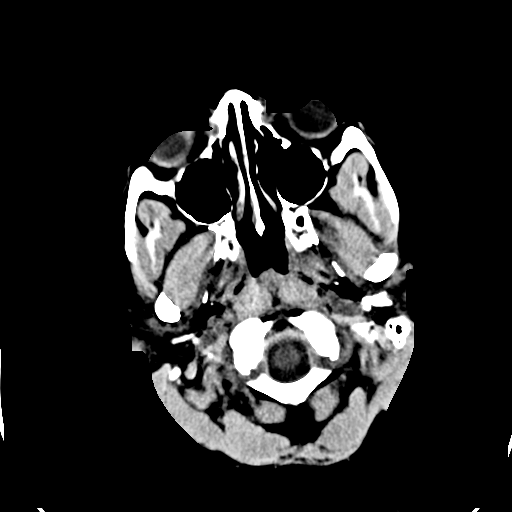
[im 2/32  bone]
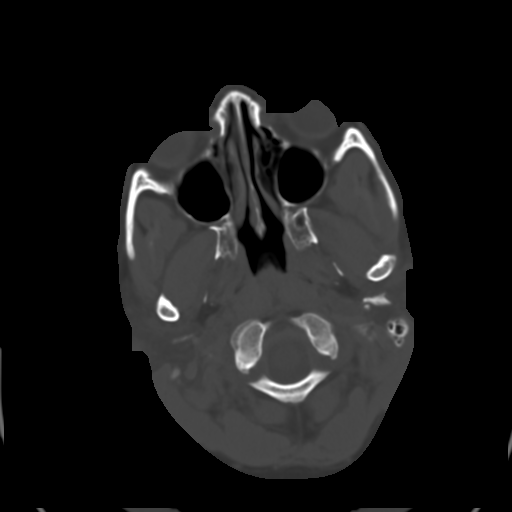
[im 4/32  brain]
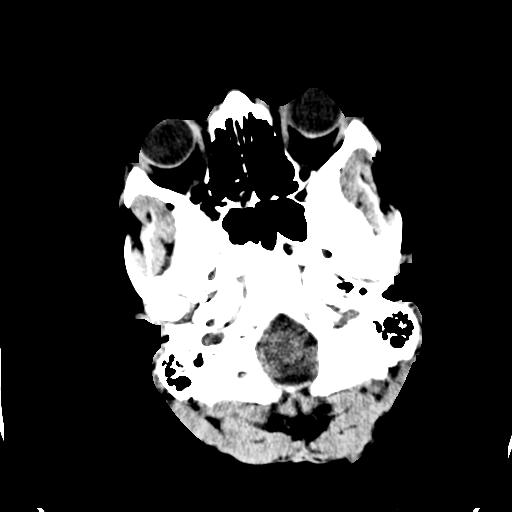
[im 6/32  brain]
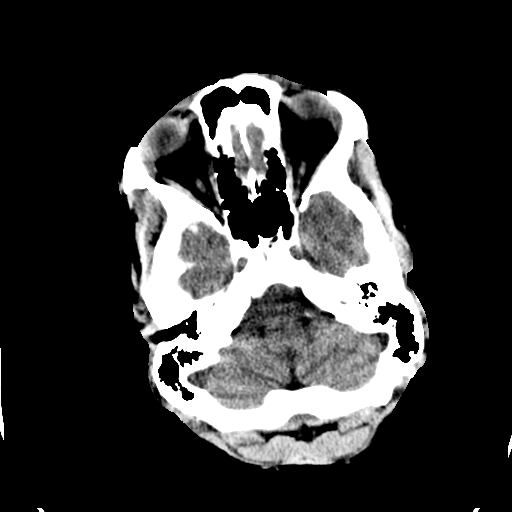
[im 8/32  brain]
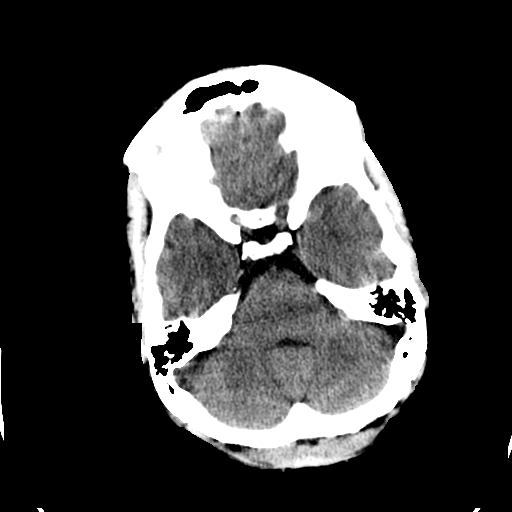
[im 9/32  brain]
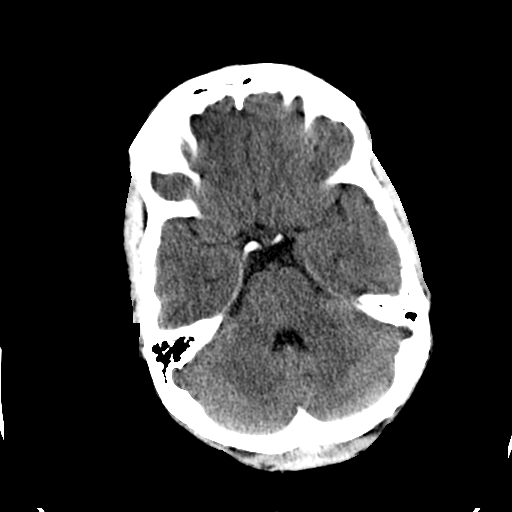
[im 9/32  bone]
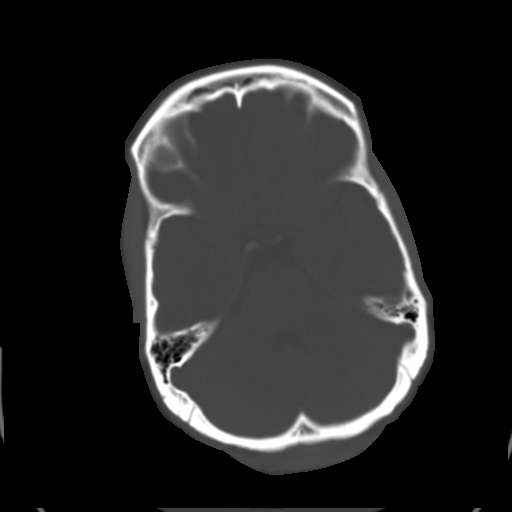
[im 11/32  brain]
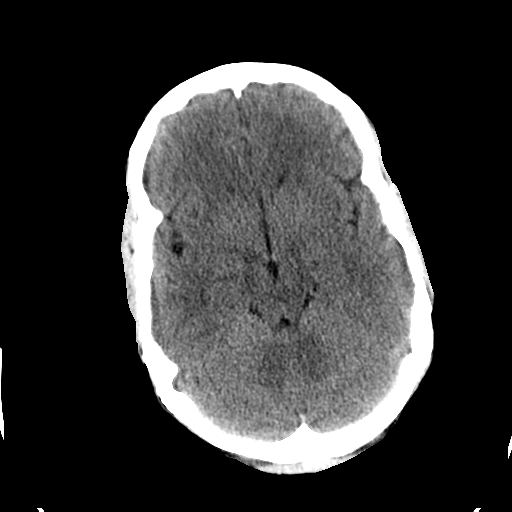
[im 13/32  brain]
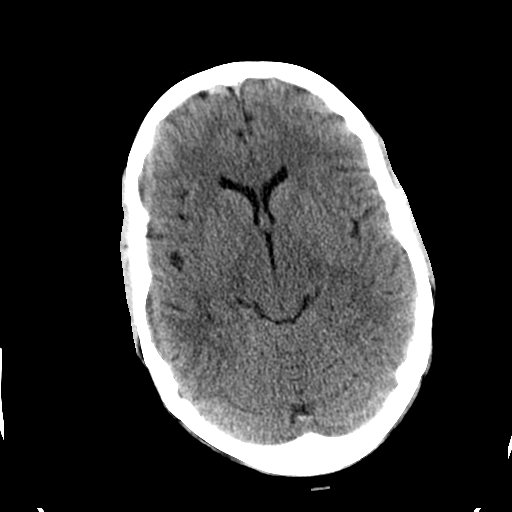
[im 15/32  brain]
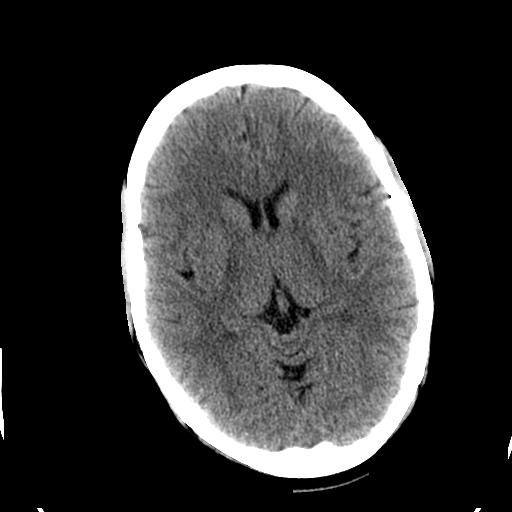
[im 17/32  brain]
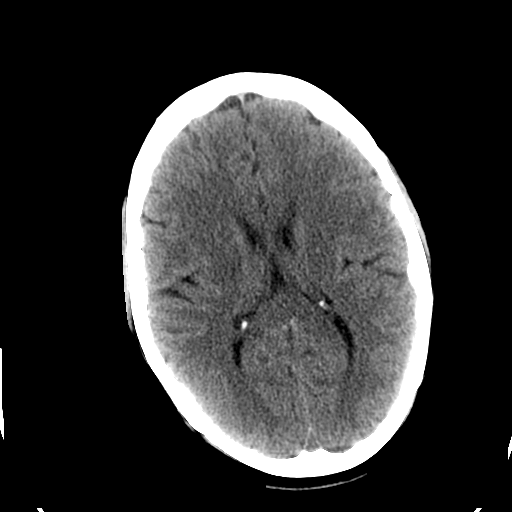
[im 17/32  bone]
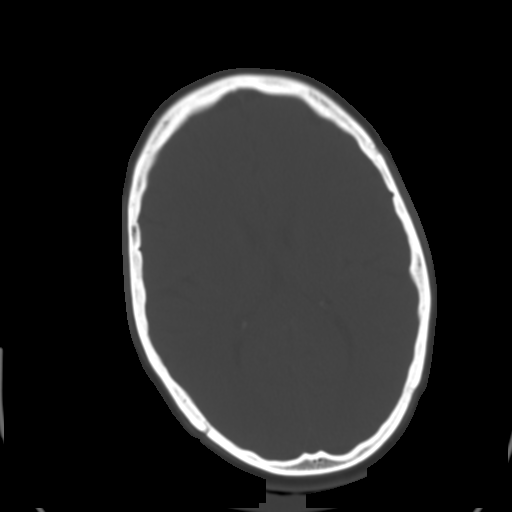
[im 19/32  brain]
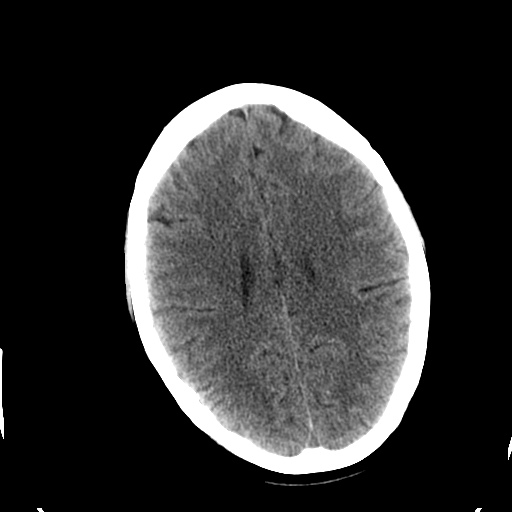
[im 21/32  brain]
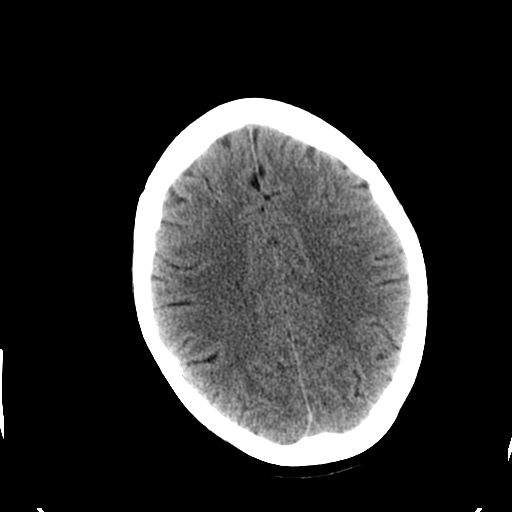
[im 23/32  brain]
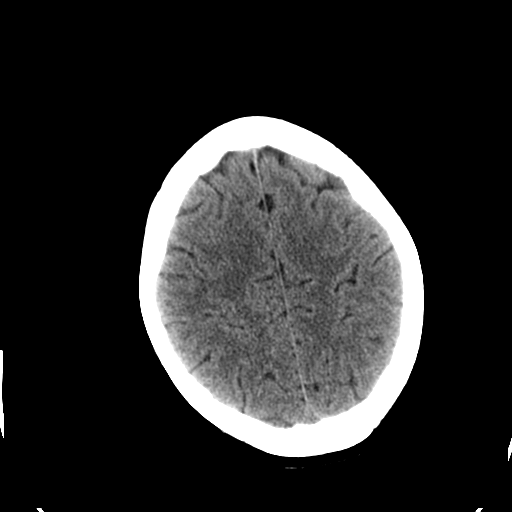
[im 24/32  brain]
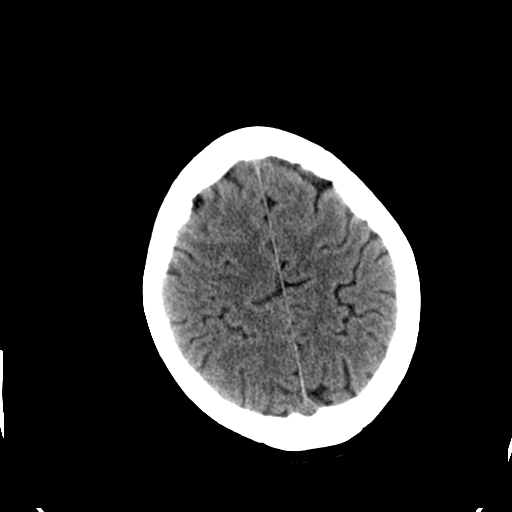
[im 24/32  bone]
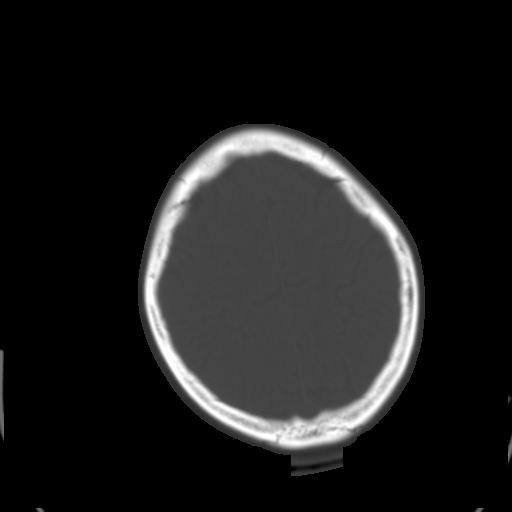
[im 26/32  brain]
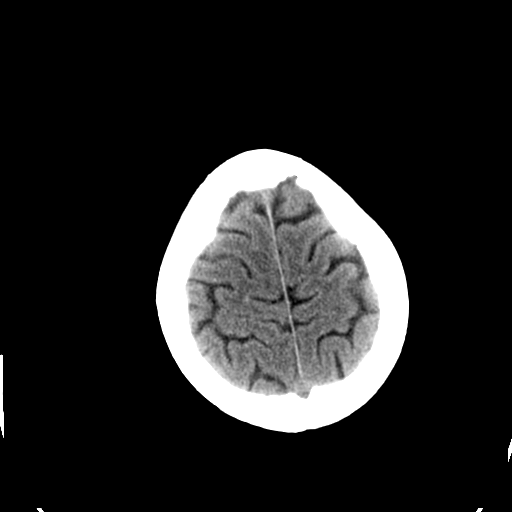
[im 28/32  brain]
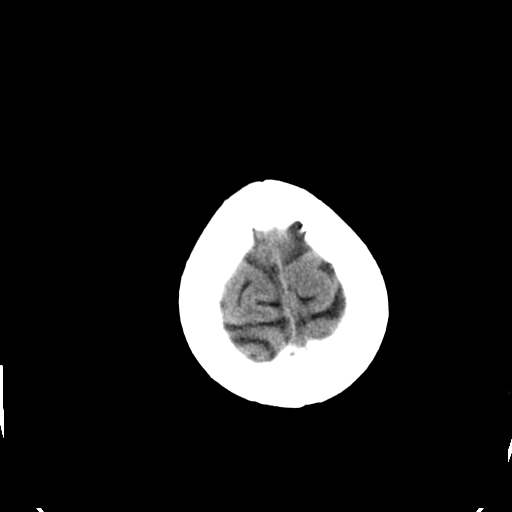
[im 30/32  brain]
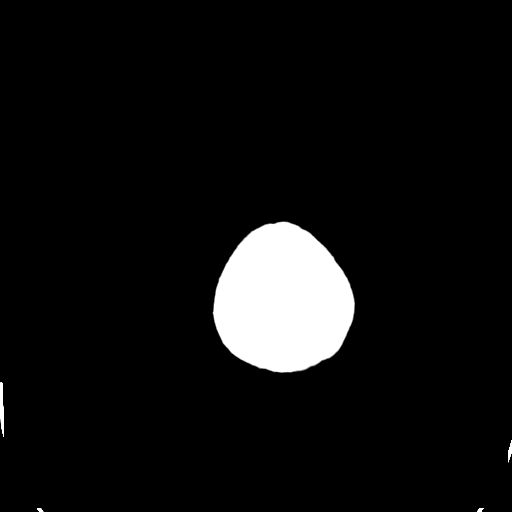

[16 of 30 positions shown; findings below may reference images not displayed]

FINDINGS: No acute intracranial abnormalities are present.  Specifically, there is no evidence for acute infarct, hemorrhage, mass, hydrocephalus, or extra-axial fluid collection.  The paranasal sinuses and mastoid air cells are clear.  The osseous skull is intact.
IMPRESSION: No acute intracranial abnormality.

## 2007-05-12 ENCOUNTER — Encounter: Payer: Self-pay | Admitting: Internal Medicine

## 2007-05-12 ENCOUNTER — Ambulatory Visit: Payer: Self-pay | Admitting: Internal Medicine

## 2007-05-12 DIAGNOSIS — R519 Headache, unspecified: Secondary | ICD-10-CM | POA: Insufficient documentation

## 2007-05-12 DIAGNOSIS — R51 Headache: Secondary | ICD-10-CM | POA: Insufficient documentation

## 2007-05-12 DIAGNOSIS — R7989 Other specified abnormal findings of blood chemistry: Secondary | ICD-10-CM | POA: Insufficient documentation

## 2007-05-12 LAB — CONVERTED CEMR LAB: Blood Glucose, Fasting: 112 mg/dL

## 2007-05-15 LAB — CONVERTED CEMR LAB
BUN: 6 mg/dL (ref 6–23)
CO2: 30 meq/L (ref 19–32)
Calcium: 9.2 mg/dL (ref 8.4–10.5)
Chloride: 104 meq/L (ref 96–112)
Creatinine, Ser: 0.7 mg/dL (ref 0.4–1.2)
GFR calc Af Amer: 124 mL/min
GFR calc non Af Amer: 102 mL/min
Glucose, Bld: 88 mg/dL (ref 70–99)
Hgb A1c MFr Bld: 5.3 % (ref 4.6–6.0)
Potassium: 3.4 meq/L — ABNORMAL LOW (ref 3.5–5.1)
Sodium: 140 meq/L (ref 135–145)

## 2007-10-07 ENCOUNTER — Observation Stay (HOSPITAL_COMMUNITY): Admission: EM | Admit: 2007-10-07 | Discharge: 2007-10-08 | Payer: Self-pay | Admitting: Emergency Medicine

## 2007-10-07 ENCOUNTER — Ambulatory Visit: Payer: Self-pay | Admitting: Internal Medicine

## 2007-10-07 IMAGING — US US PELVIS COMPLETE MODIFY
1 series · 14 of 25 positions shown · non-contrast
Comparison: none

CLINICAL DATA: 35 year old female with pelvic pain, greater on the right.  Negative pregnancy test.
 TRANSABDOMINAL AND TRANSVAGINAL PELVIC ULTRASOUND ? [DATE]:
TECHNIQUE: Both transabdominal and transvaginal ultrasound examinations of the pelvis were performed including evaluation of the uterus, ovaries, adnexal regions, and pelvic cul-de-sac.

[Series 1: unknown · 0.28mm/px · 14 of 59 slices shown]
[im 1/59]
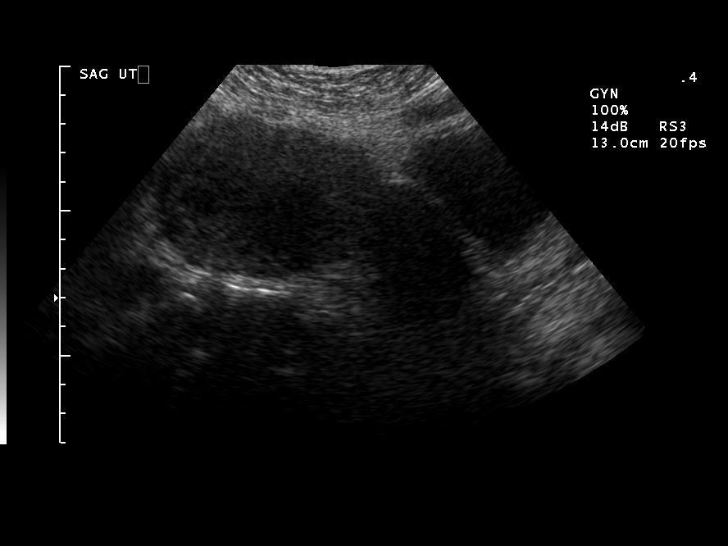
[im 5/59]
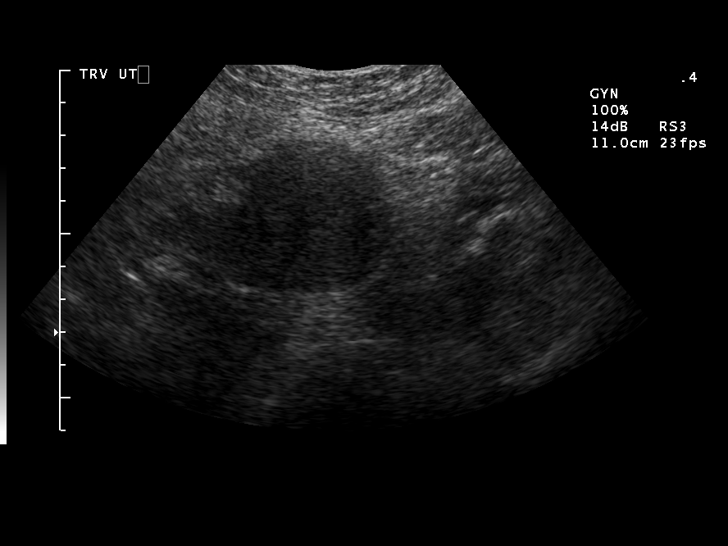
[im 10/59]
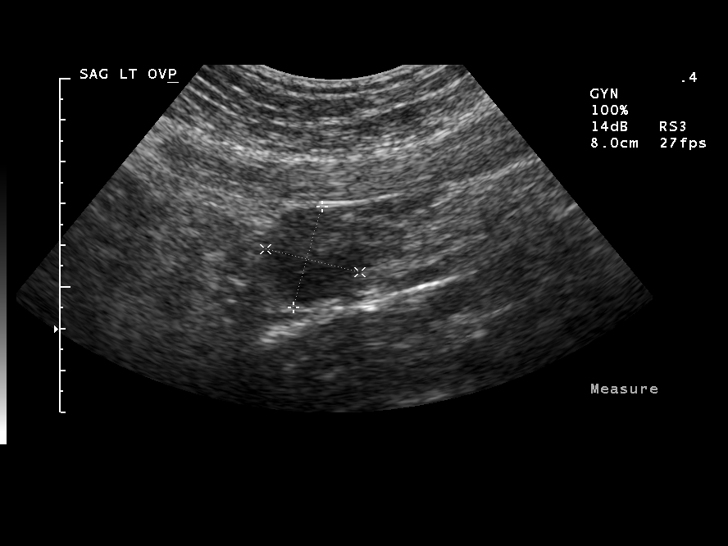
[im 15/59]
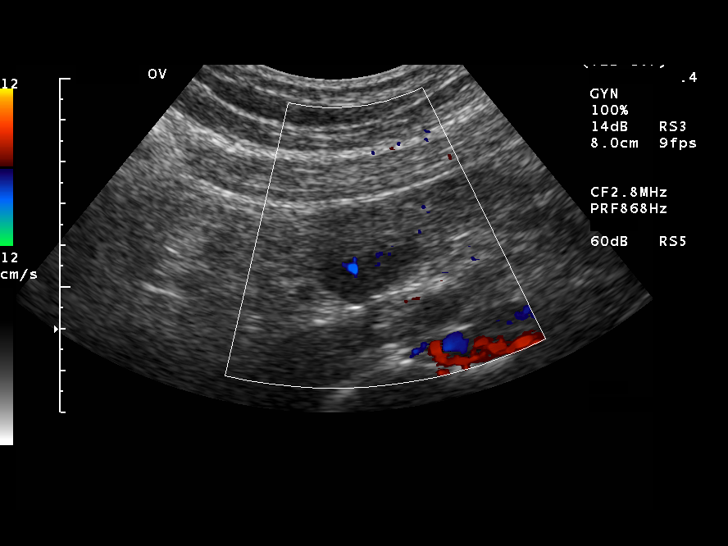
[im 20/59]
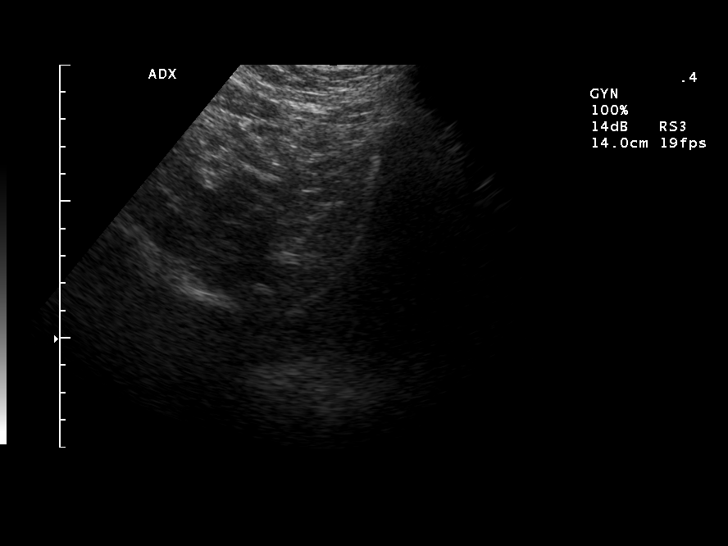
[im 22/59]
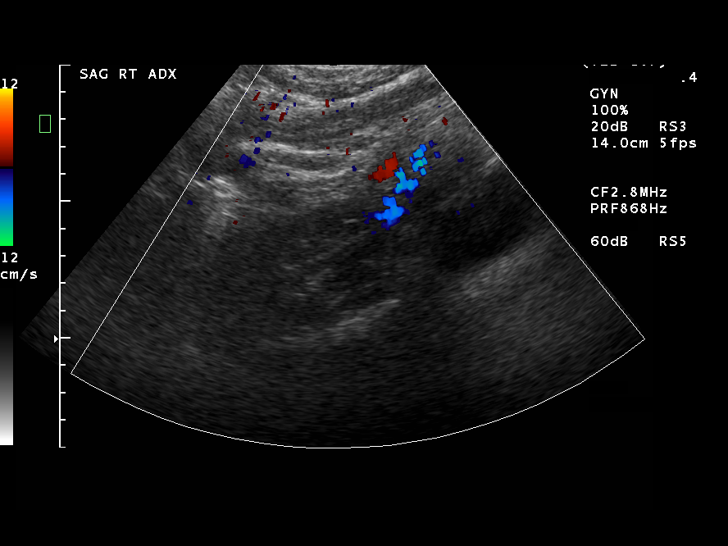
[im 27/59]
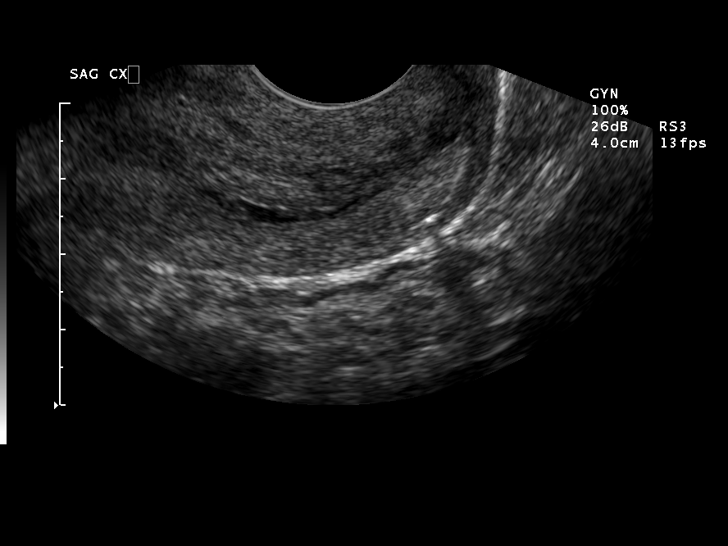
[im 32/59]
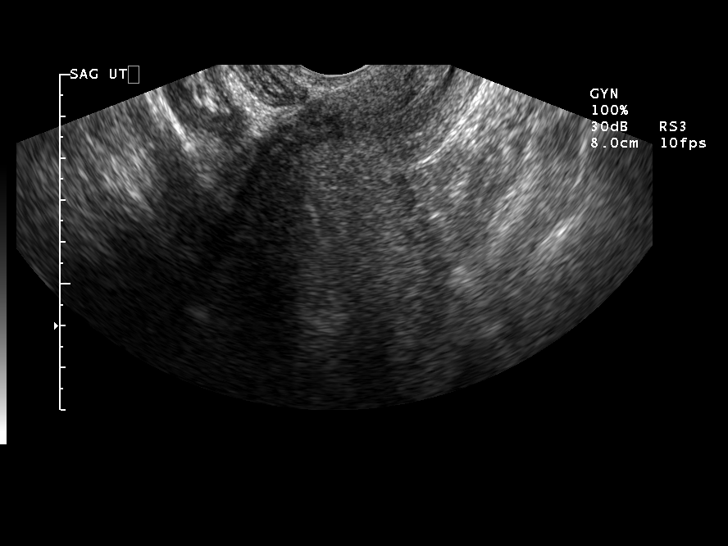
[im 37/59]
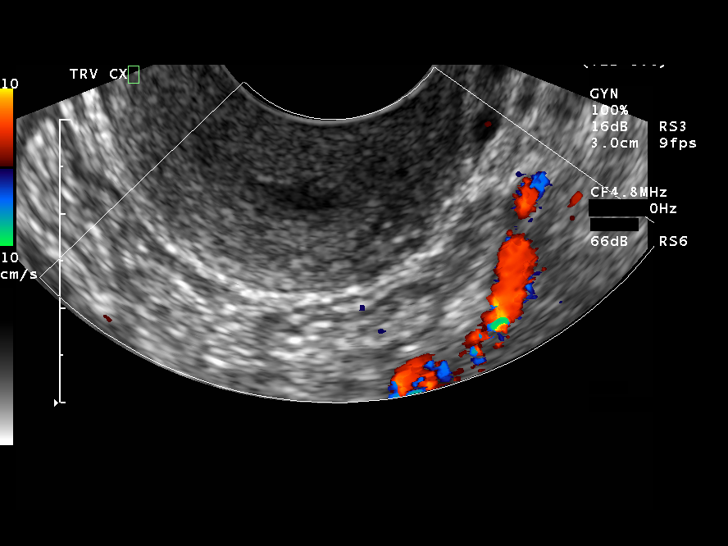
[im 39/59]
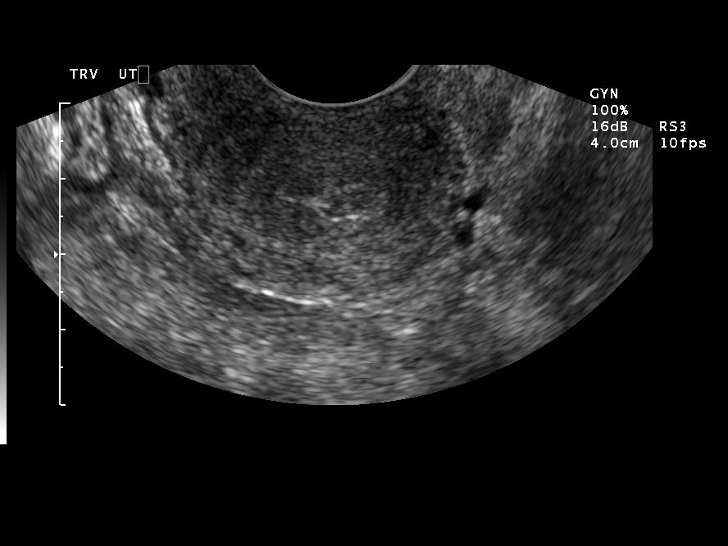
[im 44/59]
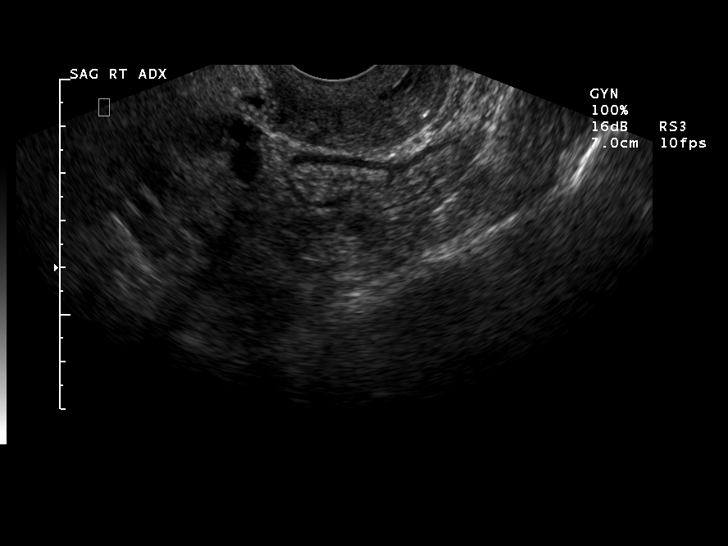
[im 49/59]
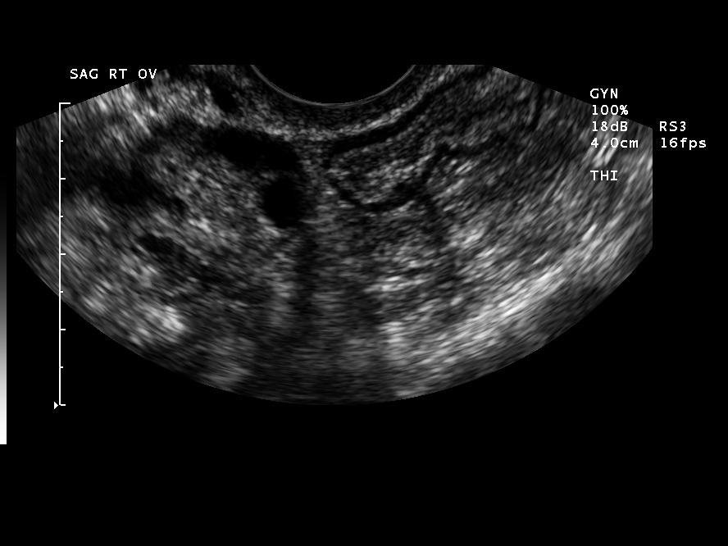
[im 54/59]
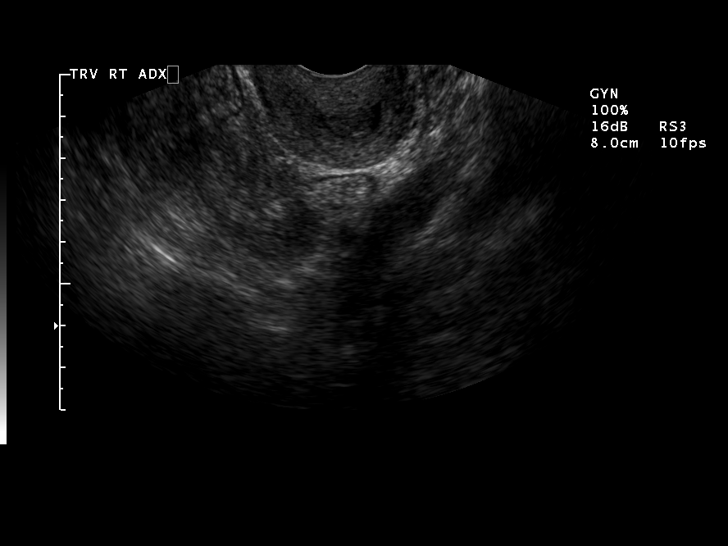
[im 59/59]
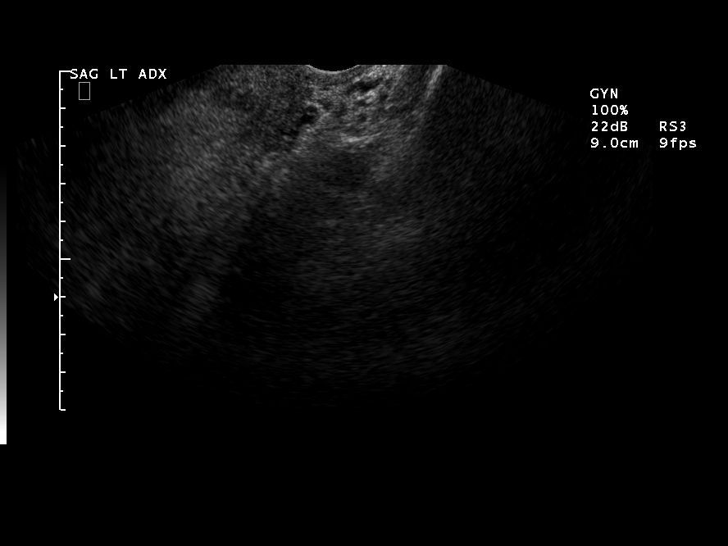

[14 of 25 positions shown; findings below may reference images not displayed]

FINDINGS: The uterus is anteverted measuring 11.5 x 5.3 x 6.7 cm.  
 There is a small amount of fluid in the endometrial canal.  The endometrial stripe measures 1.4 mm in greatest diameter.  No focal uterine lesions are identified.  The ovaries are unremarkable with several follicles.  There is no evidence of a solid adnexal mass.  A trace amount of free fluid may be physiologic.
IMPRESSION: 1.  Trace amount of free fluid may be physiologic.
 2.  Small amount of fluid in the endometrial canal.
 3.  Otherwise unremarkable uterus and ovaries.

## 2007-10-11 ENCOUNTER — Emergency Department (HOSPITAL_COMMUNITY): Admission: EM | Admit: 2007-10-11 | Discharge: 2007-10-12 | Payer: Self-pay | Admitting: Emergency Medicine

## 2007-10-12 IMAGING — CT CT ABDOMEN W/ CM
2 of 5 series · 17 of 46 positions shown, 19 images · IV contrast (omnipaque)
Comparison: None.

CLINICAL DATA: Left abdominal pain, nausea, and vomiting.  White count 10.9.  History of C-section and hernia repair.
ABDOMEN CT WITH CONTRAST:
TECHNIQUE: Multidetector CT imaging of the abdomen was performed following the standard protocol during bolus administration of intravenous contrast and oral contrast. 
Contrast:  100 cc Omnipaque 300
TECHNIQUE: Multidetector CT imaging of the pelvis was performed following the standard protocol during bolus administration of intravenous contrast and oral contrast.

[Series 2: abd_pel 5.0 b40f st · axial · 0.57mm/px · z∈[-429,-94]mm · 14 of 75 slices shown, 16 images]
[im 4/75  soft-tissue]
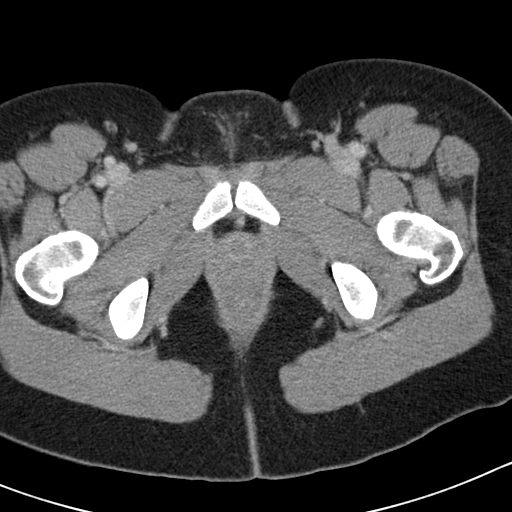
[im 4/75  bone]
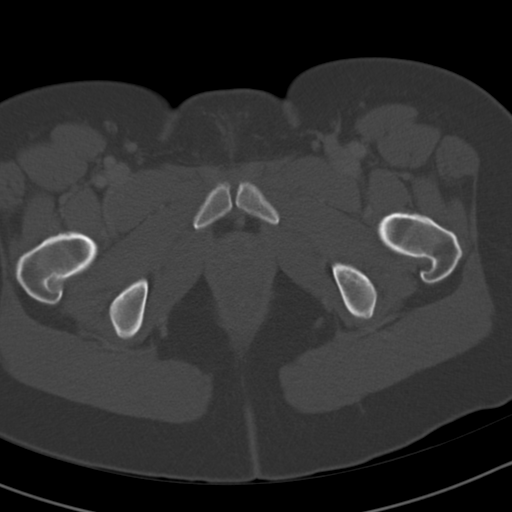
[im 12/75  soft-tissue]
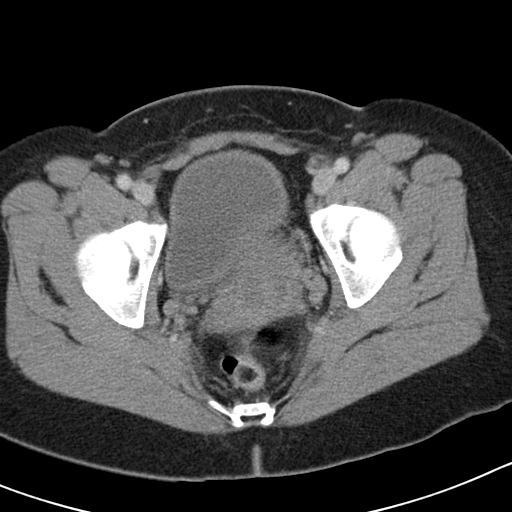
[im 15/75  soft-tissue]
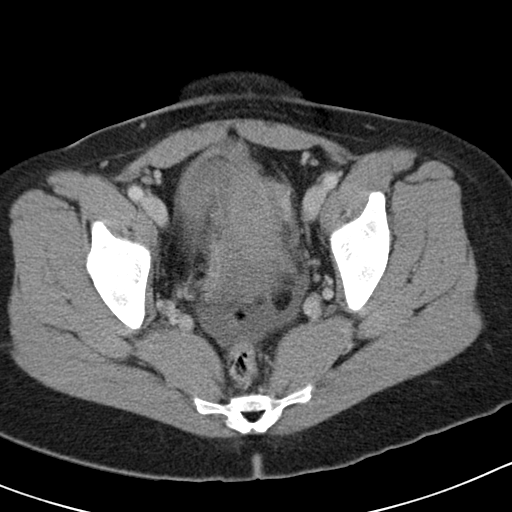
[im 19/75  soft-tissue]
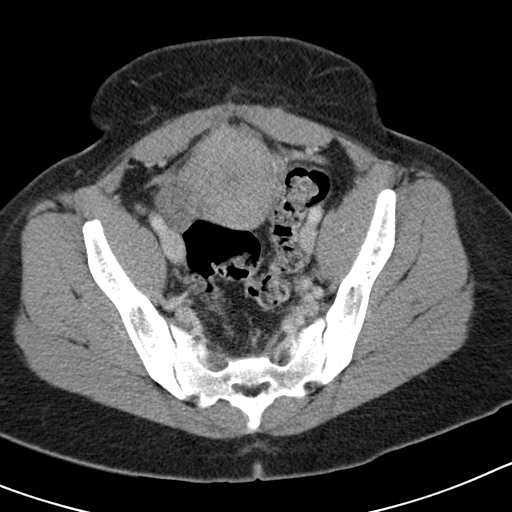
[im 26/75  soft-tissue]
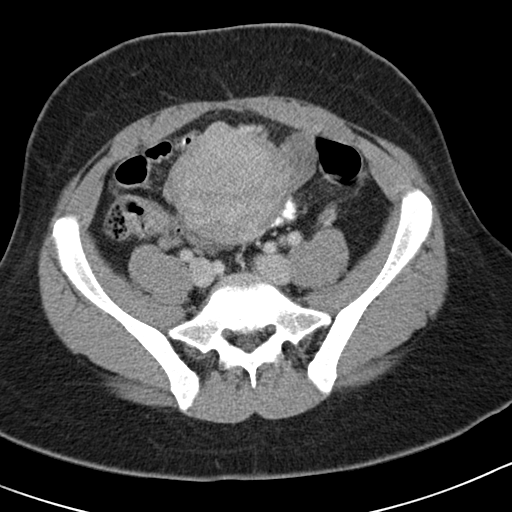
[im 30/75  soft-tissue]
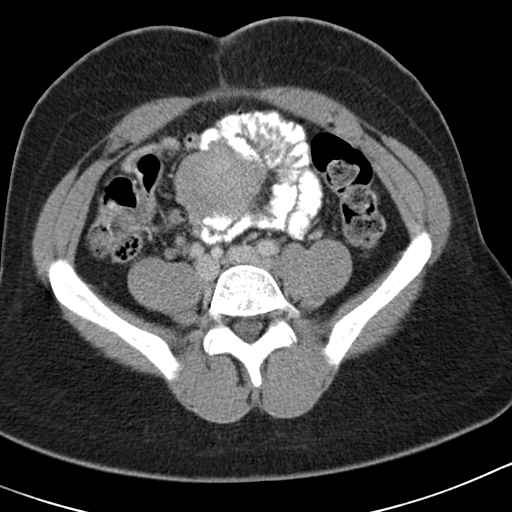
[im 34/75  soft-tissue]
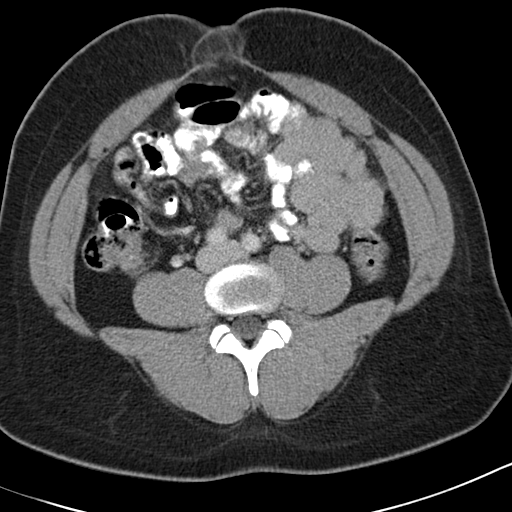
[im 41/75  soft-tissue]
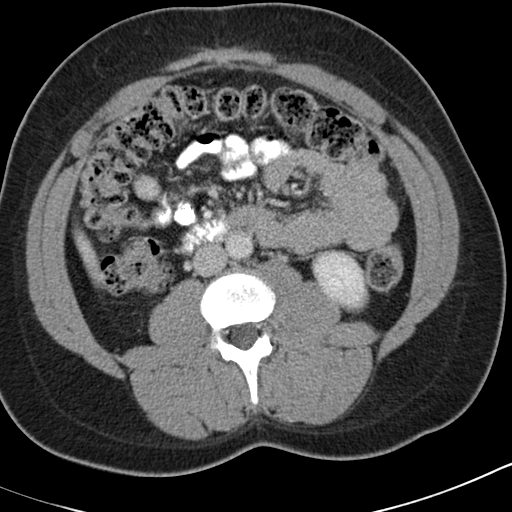
[im 45/75  soft-tissue]
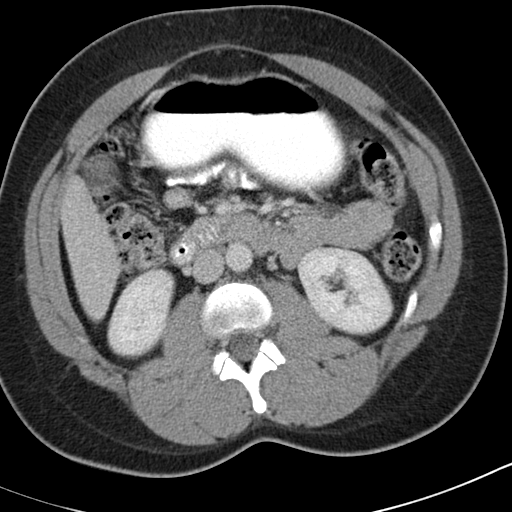
[im 45/75  bone]
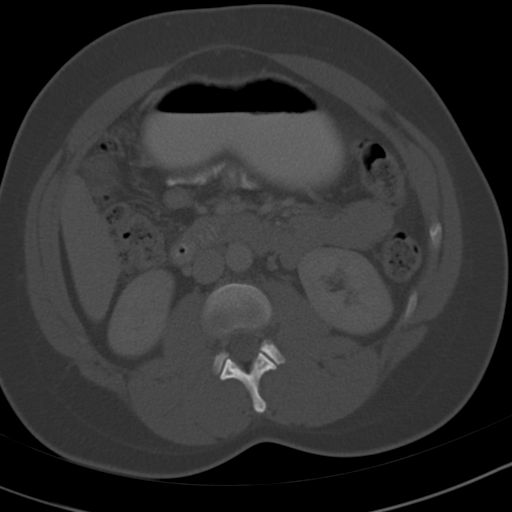
[im 49/75  soft-tissue]
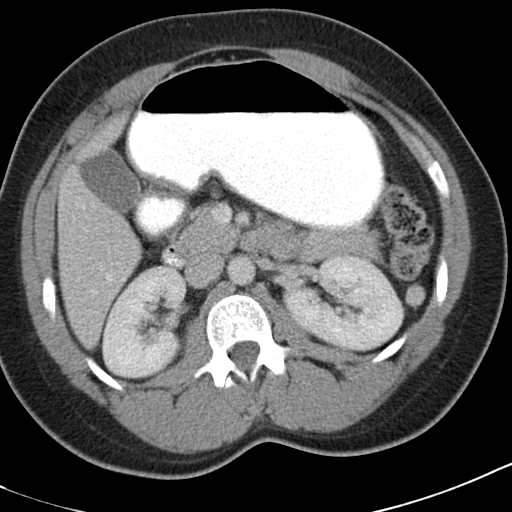
[im 56/75  soft-tissue]
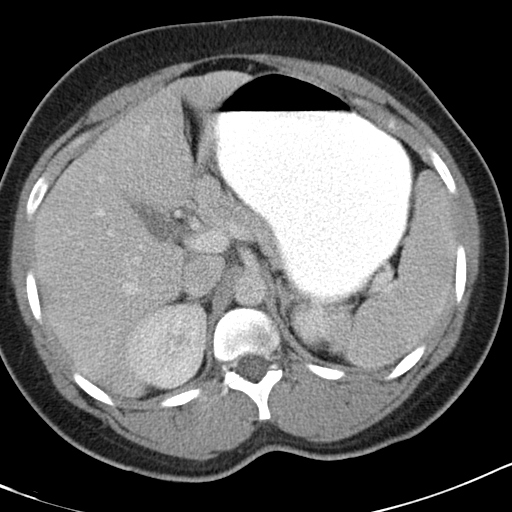
[im 60/75  soft-tissue]
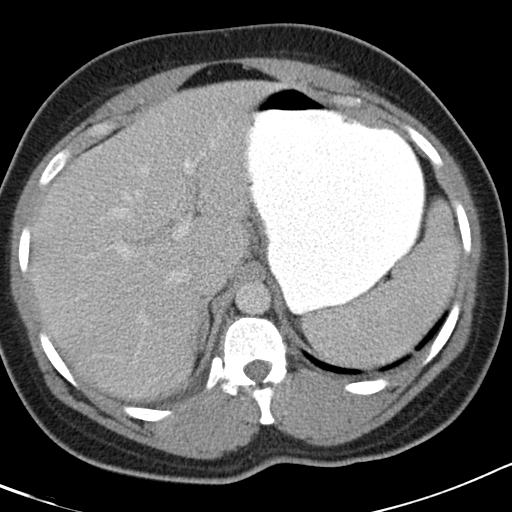
[im 63/75  soft-tissue]
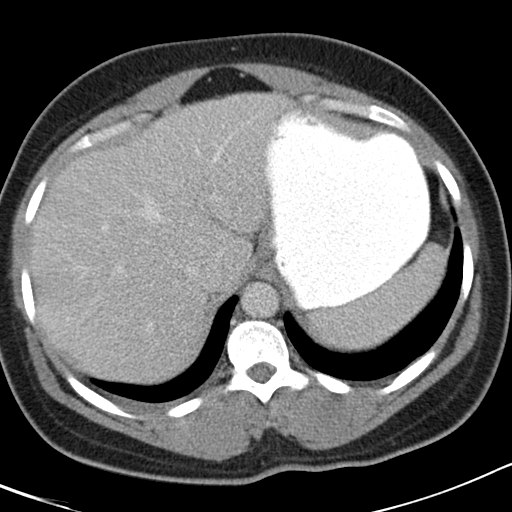
[im 71/75  soft-tissue]
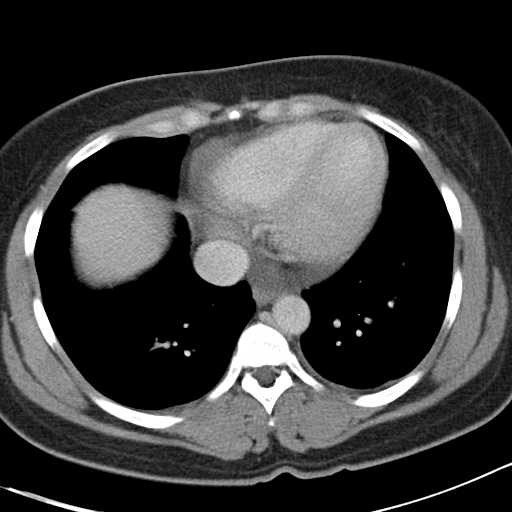

[Series 602: <mpr thick range> · coronal · 0.76mm/px · 3 of 76 slices shown]
[im 26/76  soft-tissue]
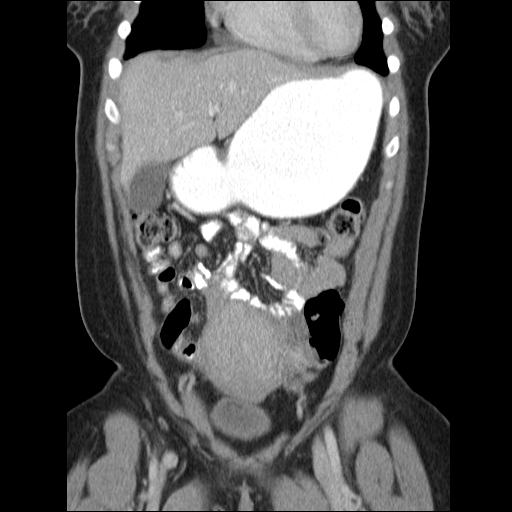
[im 34/76  soft-tissue]
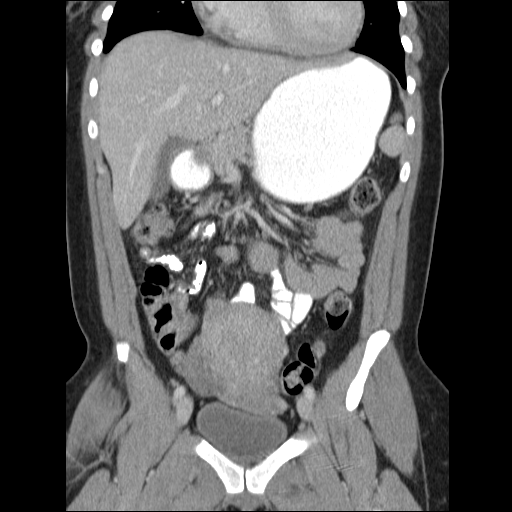
[im 42/76  soft-tissue]
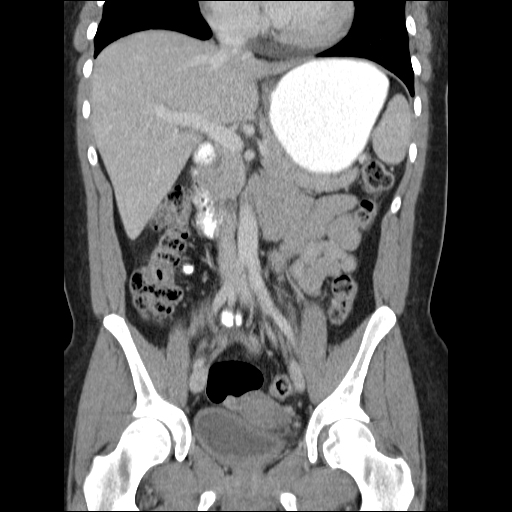

[17 of 46 positions shown; findings below may reference images not displayed]

FINDINGS: Images of the lung bases are unremarkable.  No focal abnormality is seen within the liver, spleen, pancreas, or adrenal glands.  Small low density is seen within the midpole region of the right kidney consistent with a tiny cyst measuring less than 1 cm in size.  The left kidney is unremarkable in appearance.  The gallbladder is present.  Note is made of probable hiatal hernia with gastroesophageal reflux noted at the time of exam.  There is an umbilical hernia containing only mesenteric fat.  There is a strandy appearance of the fat, raising the question of ischemia.  No evidence for associated bowel obstruction.
IMPRESSION: Umbilical hernia associated with stranding of herniated omental fat.  This raises a question of infarction of this herniated fat.
PELVIS CT WITH CONTRAST:
FINDINGS: The uterus is present.  There is a small amount of free pelvic fluid. No evidence for adnexal mass or pelvic adenopathy.  The appendix is well seen and has a normal appearance.
IMPRESSION: Small amount of free pelvic fluid. No evidence for acute abnormality of the pelvis.  See above for description of the abdomen.

## 2007-10-15 ENCOUNTER — Observation Stay (HOSPITAL_COMMUNITY): Admission: AD | Admit: 2007-10-15 | Discharge: 2007-10-16 | Payer: Self-pay | Admitting: *Deleted

## 2007-10-15 IMAGING — US US TRANSVAGINAL NON-OB
1 series · 14 of 25 positions shown · non-contrast
Comparison: none

CLINICAL DATA: Fibroids. Pain. Bleeding. 
 TRANSABDOMINAL AND TRANSVAGINAL PELVIC ULTRASOUND:
TECHNIQUE: Both transabdominal and transvaginal ultrasound examinations of the pelvis were performed including evaluation of the uterus, ovaries, adnexal regions, and pelvic cul-de-sac.

[Series 1: us pelvis complete · 14 of 38 slices shown]
[im 1/38]
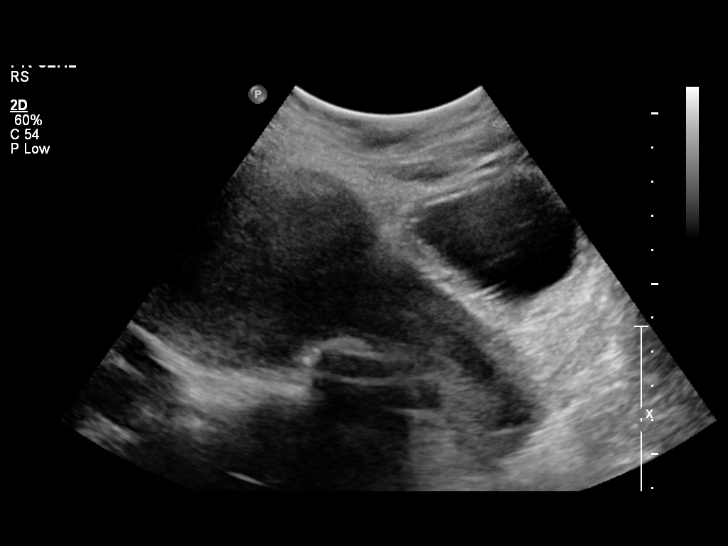
[im 4/38]
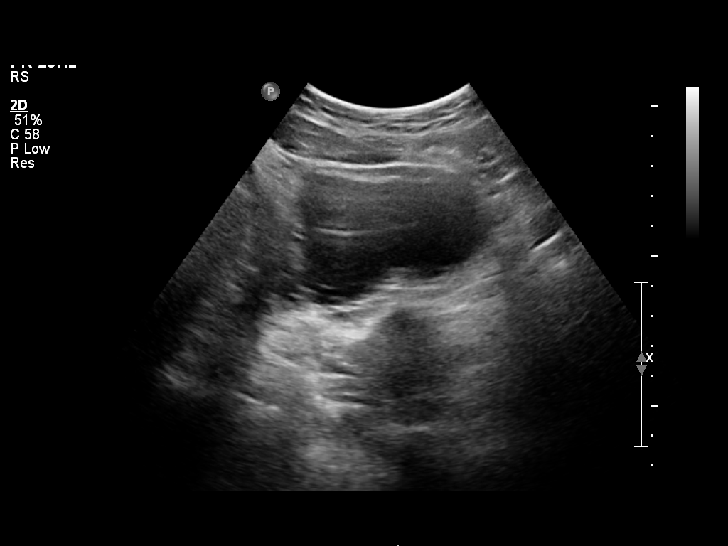
[im 7/38]
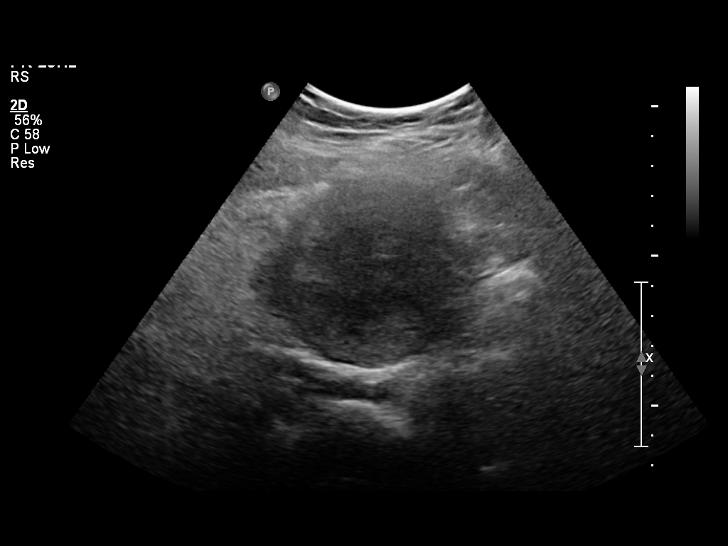
[im 10/38]
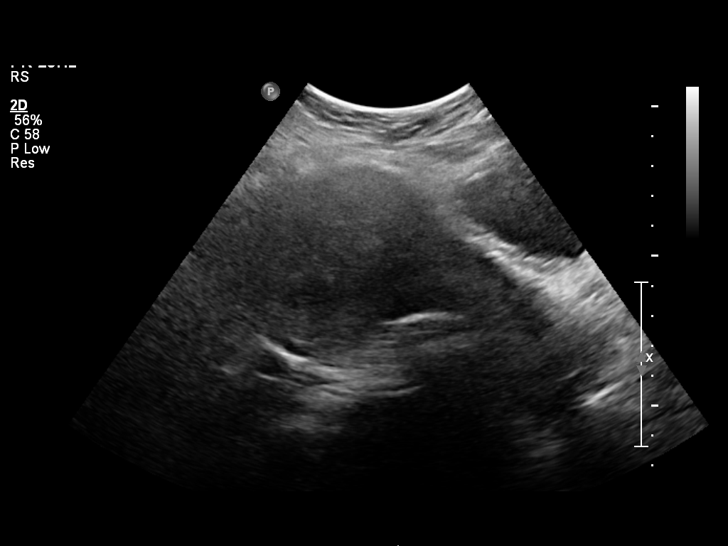
[im 13/38]
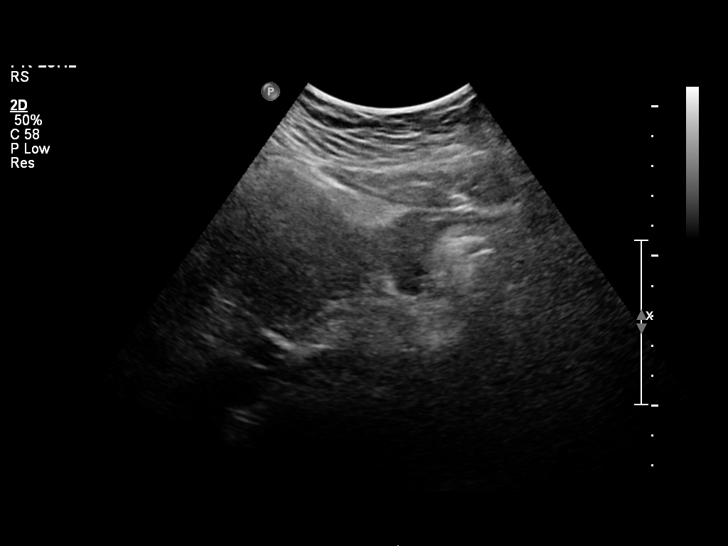
[im 14/38]
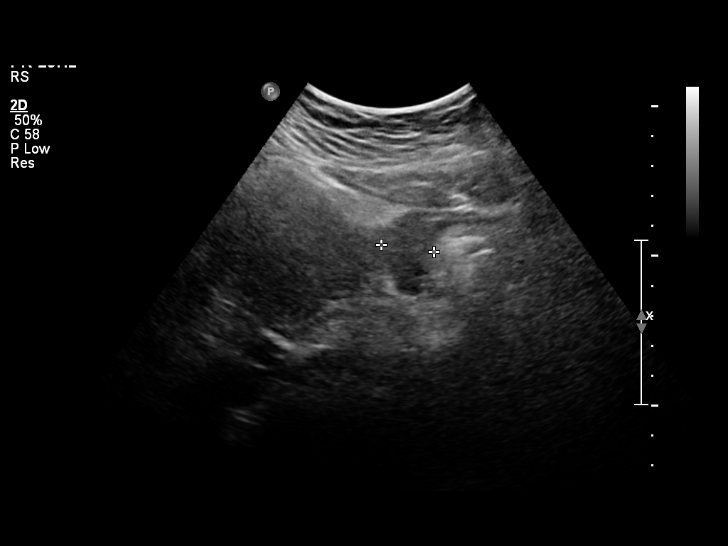
[im 17/38]
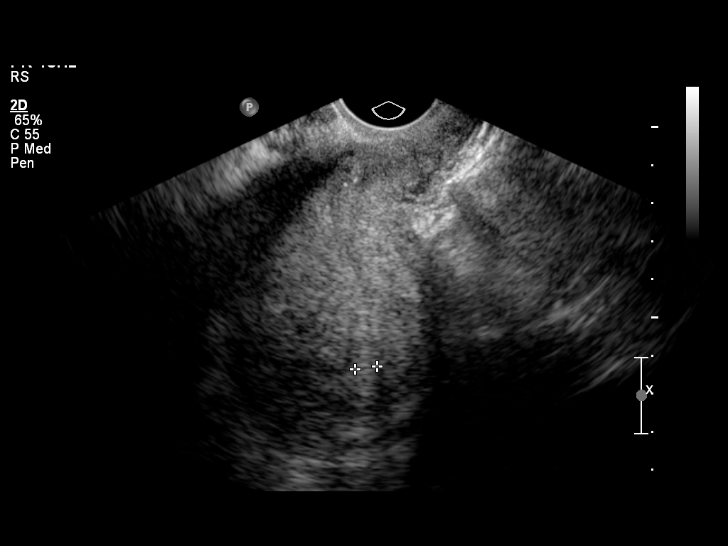
[im 21/38]
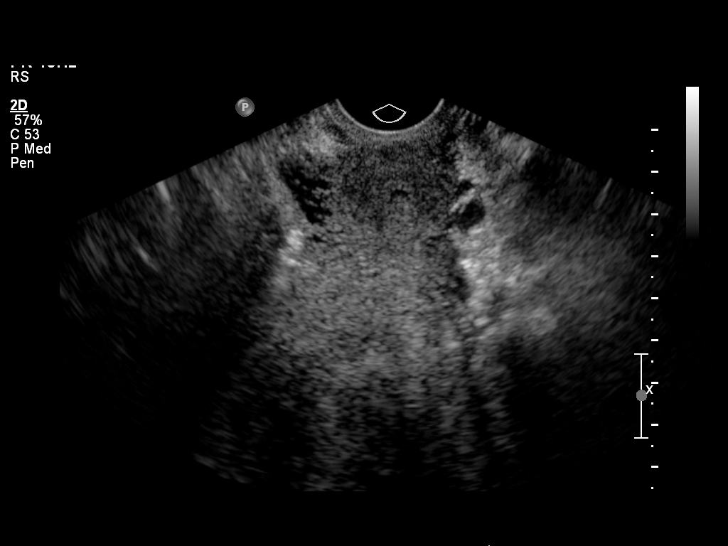
[im 24/38]
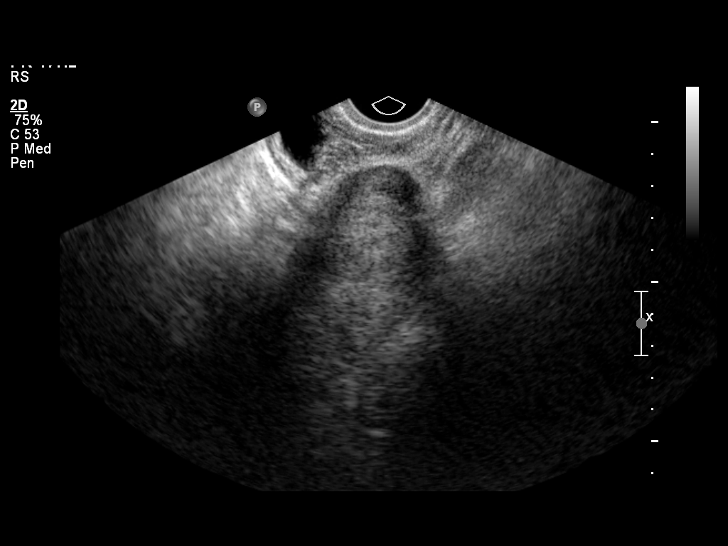
[im 25/38]
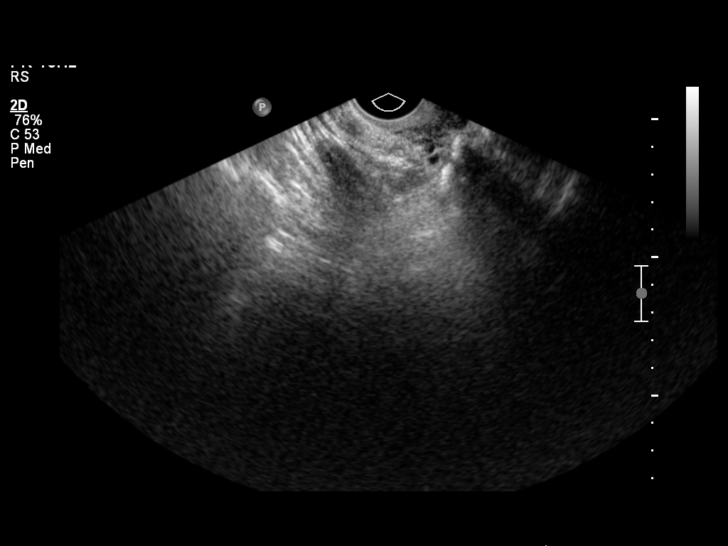
[im 28/38]
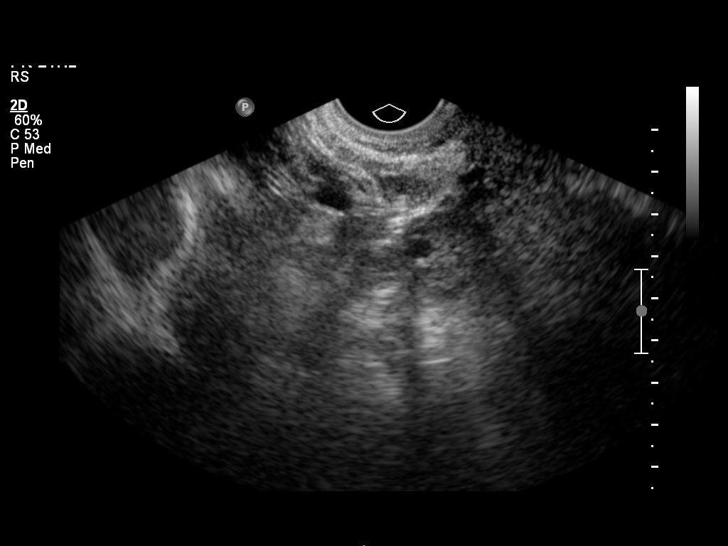
[im 31/38]
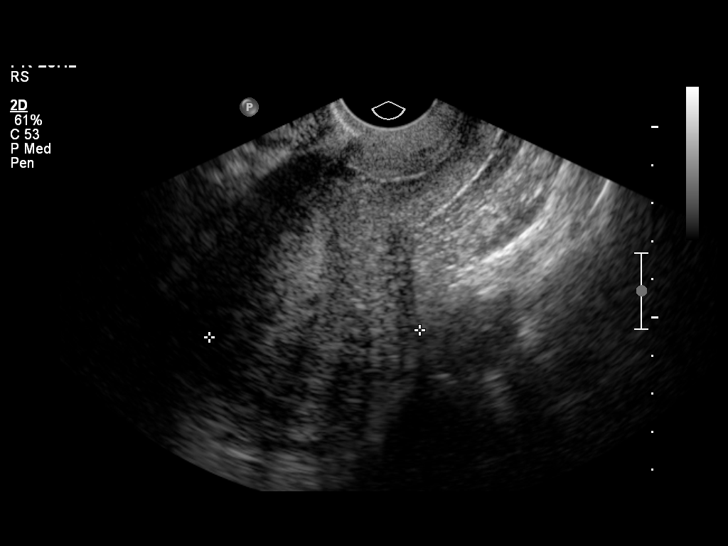
[im 34/38]
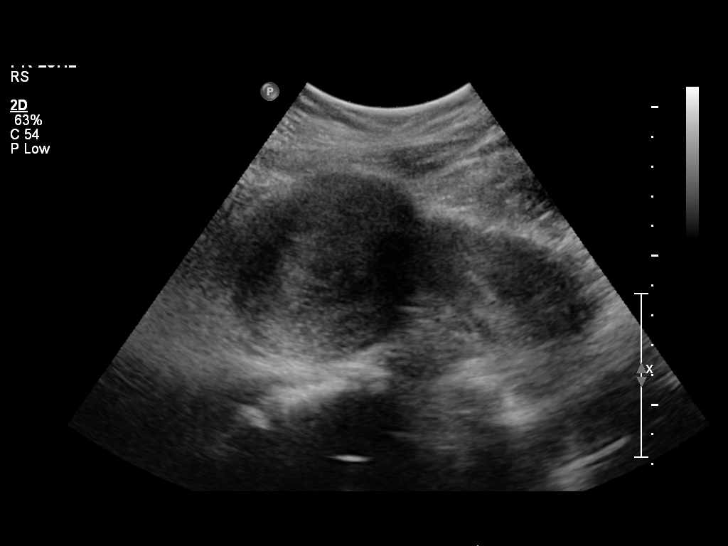
[im 38/38]
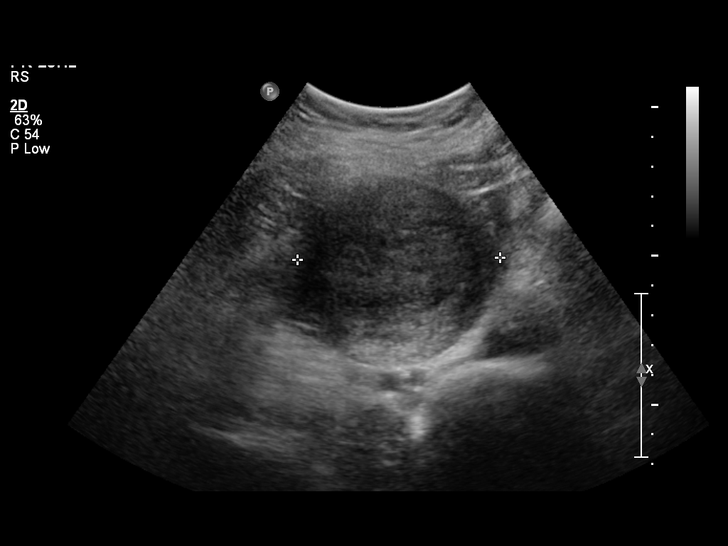

[14 of 25 positions shown; findings below may reference images not displayed]

FINDINGS: Uterus measures 12 cm in length and the fundus measures 6.3 x 6.8 cm in AP and width dimensions, respectively.  Right ovary measures 2.0 x 2.0 x 2.3 cm. The left ovary measures 1.7 x 1.8 x 2.7 cm. Uterus appears somewhat retroverted.  Endometrial stripe complex measures 5.8 mm in thickness.  No uterine or adnexal mass.  Small amount of free pelvic fluid.
IMPRESSION: No pelvic mass.  Small amount of free fluid.

## 2007-10-19 ENCOUNTER — Ambulatory Visit: Payer: Self-pay | Admitting: Internal Medicine

## 2007-10-19 DIAGNOSIS — I10 Essential (primary) hypertension: Secondary | ICD-10-CM

## 2007-10-19 DIAGNOSIS — D649 Anemia, unspecified: Secondary | ICD-10-CM | POA: Insufficient documentation

## 2007-10-19 HISTORY — DX: Essential (primary) hypertension: I10

## 2007-10-19 LAB — CONVERTED CEMR LAB: Hemoglobin: 8.4 g/dL

## 2007-10-25 ENCOUNTER — Ambulatory Visit: Payer: Self-pay | Admitting: Internal Medicine

## 2007-10-28 ENCOUNTER — Ambulatory Visit: Payer: Self-pay | Admitting: Oncology

## 2007-11-09 LAB — CBC WITH DIFFERENTIAL (CANCER CENTER ONLY)
BASO#: 0 10*3/uL (ref 0.0–0.2)
BASO%: 0.3 % (ref 0.0–2.0)
EOS%: 2.5 % (ref 0.0–7.0)
Eosinophils Absolute: 0.2 10*3/uL (ref 0.0–0.5)
HCT: 28.7 % — ABNORMAL LOW (ref 34.8–46.6)
HGB: 8.9 g/dL — ABNORMAL LOW (ref 11.6–15.9)
LYMPH#: 1.8 10*3/uL (ref 0.9–3.3)
LYMPH%: 24.6 % (ref 14.0–48.0)
MCH: 21.6 pg — ABNORMAL LOW (ref 26.0–34.0)
MCHC: 31 g/dL — ABNORMAL LOW (ref 32.0–36.0)
MCV: 70 fL — ABNORMAL LOW (ref 81–101)
MONO#: 0.5 10*3/uL (ref 0.1–0.9)
MONO%: 6.7 % (ref 0.0–13.0)
NEUT#: 4.9 10*3/uL (ref 1.5–6.5)
NEUT%: 65.9 % (ref 39.6–80.0)
Platelets: 293 10*3/uL (ref 145–400)
RBC: 4.1 10*6/uL (ref 3.70–5.32)
RDW: 23.1 % — ABNORMAL HIGH (ref 10.5–14.6)
WBC: 7.4 10*3/uL (ref 3.9–10.0)

## 2007-11-09 LAB — MORPHOLOGY - CHCC SATELLITE
PLT EST ~~LOC~~: ADEQUATE
Platelet Morphology: NORMAL

## 2007-11-10 LAB — COMPREHENSIVE METABOLIC PANEL
ALT: 13 U/L (ref 0–35)
AST: 18 U/L (ref 0–37)
Albumin: 3.9 g/dL (ref 3.5–5.2)
Alkaline Phosphatase: 66 U/L (ref 39–117)
BUN: 7 mg/dL (ref 6–23)
CO2: 27 mEq/L (ref 19–32)
Calcium: 9.5 mg/dL (ref 8.4–10.5)
Chloride: 103 mEq/L (ref 96–112)
Creatinine, Ser: 0.7 mg/dL (ref 0.40–1.20)
Glucose, Bld: 95 mg/dL (ref 70–99)
Potassium: 3.6 mEq/L (ref 3.5–5.3)
Sodium: 136 mEq/L (ref 135–145)
Total Bilirubin: 0.4 mg/dL (ref 0.3–1.2)
Total Protein: 7.6 g/dL (ref 6.0–8.3)

## 2007-11-10 LAB — RETICULOCYTES (CHCC)
ABS Retic: 21 10*3/uL (ref 19.0–186.0)
RBC.: 4.19 MIL/uL (ref 3.87–5.11)
Retic Ct Pct: 0.5 % (ref 0.4–3.1)

## 2007-11-14 HISTORY — PX: ABDOMINAL HYSTERECTOMY: SHX81

## 2007-11-15 LAB — FOLATE: Folate: 17.5 ng/mL

## 2007-11-15 LAB — IRON AND TIBC
%SAT: 5 % — ABNORMAL LOW (ref 20–55)
Iron: 21 ug/dL — ABNORMAL LOW (ref 42–145)
TIBC: 404 ug/dL (ref 250–470)
UIBC: 383 ug/dL

## 2007-11-15 LAB — PROTEIN ELECTROPHORESIS, SERUM
Albumin ELP: 52.9 % — ABNORMAL LOW (ref 55.8–66.1)
Alpha-1-Globulin: 4 % (ref 2.9–4.9)
Alpha-2-Globulin: 7.7 % (ref 7.1–11.8)
Beta 2: 5.7 % (ref 3.2–6.5)
Beta Globulin: 7.1 % (ref 4.7–7.2)
Gamma Globulin: 22.6 % — ABNORMAL HIGH (ref 11.1–18.8)
Total Protein, Serum Electrophoresis: 7.8 g/dL (ref 6.0–8.3)

## 2007-11-15 LAB — ERYTHROPOIETIN: Erythropoietin: 104 m[IU]/mL — ABNORMAL HIGH (ref 2.6–34.0)

## 2007-11-15 LAB — FERRITIN: Ferritin: 3 ng/mL — ABNORMAL LOW (ref 10–291)

## 2007-11-15 LAB — VITAMIN B12: Vitamin B-12: 566 pg/mL (ref 211–911)

## 2007-11-21 ENCOUNTER — Encounter (HOSPITAL_COMMUNITY): Admission: RE | Admit: 2007-11-21 | Discharge: 2007-12-14 | Payer: Self-pay | Admitting: Oncology

## 2007-11-21 LAB — TYPE & CROSSMATCH - CHCC SATELLITE

## 2007-12-02 ENCOUNTER — Inpatient Hospital Stay (HOSPITAL_COMMUNITY): Admission: RE | Admit: 2007-12-02 | Discharge: 2007-12-04 | Payer: Self-pay | Admitting: Obstetrics and Gynecology

## 2007-12-02 ENCOUNTER — Encounter (INDEPENDENT_AMBULATORY_CARE_PROVIDER_SITE_OTHER): Payer: Self-pay | Admitting: Obstetrics and Gynecology

## 2007-12-14 ENCOUNTER — Ambulatory Visit: Payer: Self-pay | Admitting: Oncology

## 2007-12-20 LAB — CBC WITH DIFFERENTIAL (CANCER CENTER ONLY)
BASO#: 0 10*3/uL (ref 0.0–0.2)
BASO%: 0.3 % (ref 0.0–2.0)
EOS%: 3.7 % (ref 0.0–7.0)
Eosinophils Absolute: 0.2 10*3/uL (ref 0.0–0.5)
HCT: 30.8 % — ABNORMAL LOW (ref 34.8–46.6)
HGB: 9.9 g/dL — ABNORMAL LOW (ref 11.6–15.9)
LYMPH#: 1.5 10*3/uL (ref 0.9–3.3)
LYMPH%: 24.8 % (ref 14.0–48.0)
MCH: 24.4 pg — ABNORMAL LOW (ref 26.0–34.0)
MCHC: 32.2 g/dL (ref 32.0–36.0)
MCV: 76 fL — ABNORMAL LOW (ref 81–101)
MONO#: 0.4 10*3/uL (ref 0.1–0.9)
MONO%: 6.1 % (ref 0.0–13.0)
NEUT#: 4 10*3/uL (ref 1.5–6.5)
NEUT%: 65.1 % (ref 39.6–80.0)
Platelets: 415 10*3/uL — ABNORMAL HIGH (ref 145–400)
RBC: 4.06 10*6/uL (ref 3.70–5.32)
RDW: 17.3 % — ABNORMAL HIGH (ref 10.5–14.6)
WBC: 6.1 10*3/uL (ref 3.9–10.0)

## 2008-02-21 ENCOUNTER — Ambulatory Visit: Payer: Self-pay | Admitting: Internal Medicine

## 2008-02-27 LAB — CONVERTED CEMR LAB
BUN: 6 mg/dL (ref 6–23)
CO2: 31 meq/L (ref 19–32)
Calcium: 9.3 mg/dL (ref 8.4–10.5)
Chloride: 106 meq/L (ref 96–112)
Creatinine, Ser: 0.7 mg/dL (ref 0.4–1.2)
GFR calc Af Amer: 123 mL/min
GFR calc non Af Amer: 102 mL/min
Glucose, Bld: 92 mg/dL (ref 70–99)
Potassium: 4.4 meq/L (ref 3.5–5.1)
Sodium: 139 meq/L (ref 135–145)
TSH: 1.09 microintl units/mL (ref 0.35–5.50)

## 2008-06-25 ENCOUNTER — Encounter (INDEPENDENT_AMBULATORY_CARE_PROVIDER_SITE_OTHER): Payer: Self-pay | Admitting: *Deleted

## 2008-07-28 ENCOUNTER — Emergency Department (HOSPITAL_BASED_OUTPATIENT_CLINIC_OR_DEPARTMENT_OTHER): Admission: EM | Admit: 2008-07-28 | Discharge: 2008-07-28 | Payer: Self-pay | Admitting: Emergency Medicine

## 2008-07-28 IMAGING — CT CT HEAD W/O CM
1 series · 16 of 30 positions shown, 20 images · non-contrast
Comparison: [DATE]

CLINICAL DATA: Headache and fever

CT HEAD WITHOUT CONTRAST
TECHNIQUE: Contiguous axial images were obtained from the base of
the skull through the vertex without contrast.

[Series 2: head 4.8 h37s · axial · 0.42mm/px · z∈[-181,-48]mm · 16 of 32 slices shown, 20 images]
[im 2/32  brain]
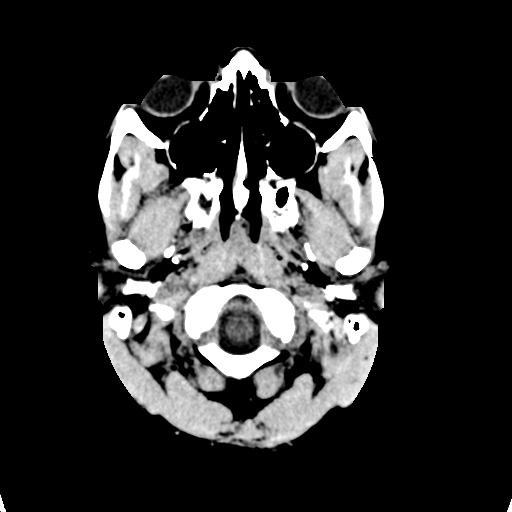
[im 2/32  bone]
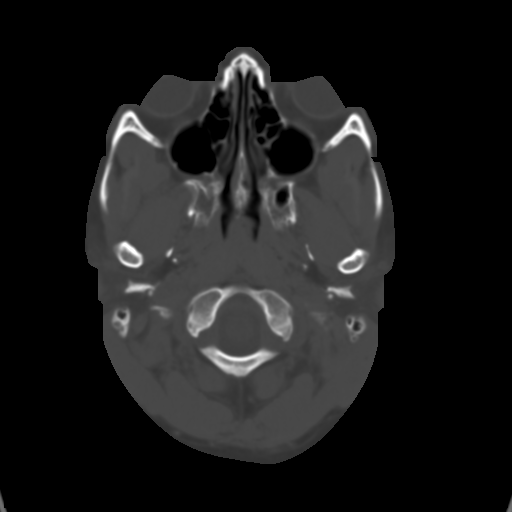
[im 4/32  brain]
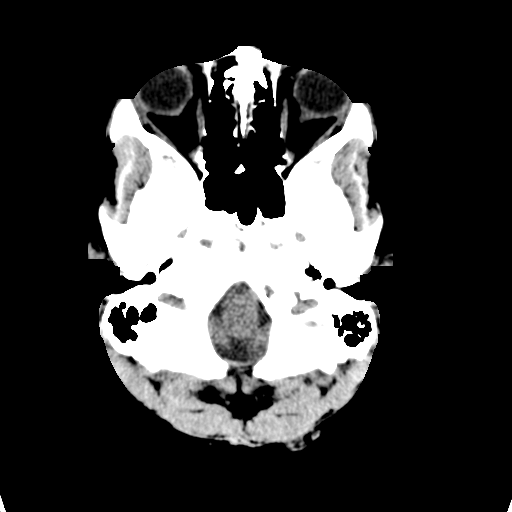
[im 6/32  brain]
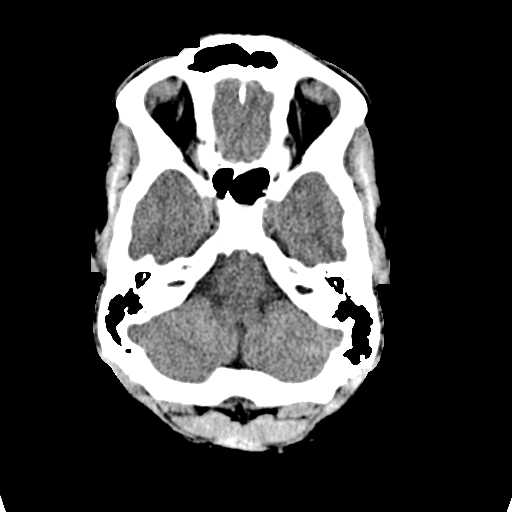
[im 8/32  brain]
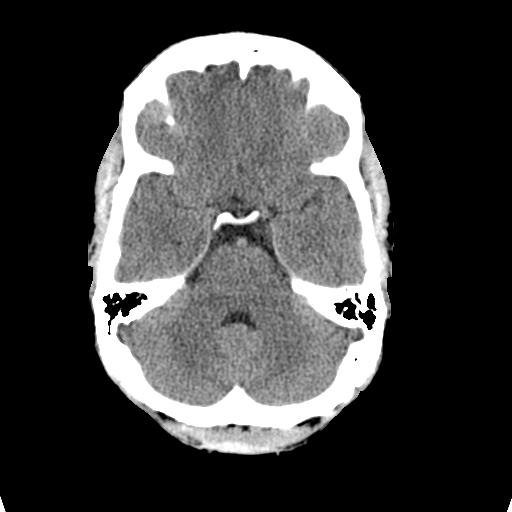
[im 9/32  brain]
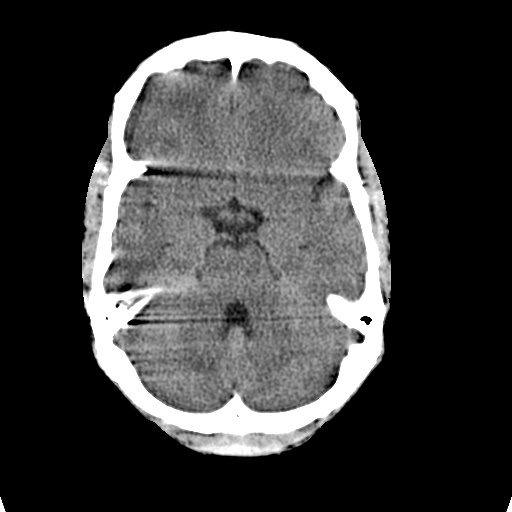
[im 9/32  bone]
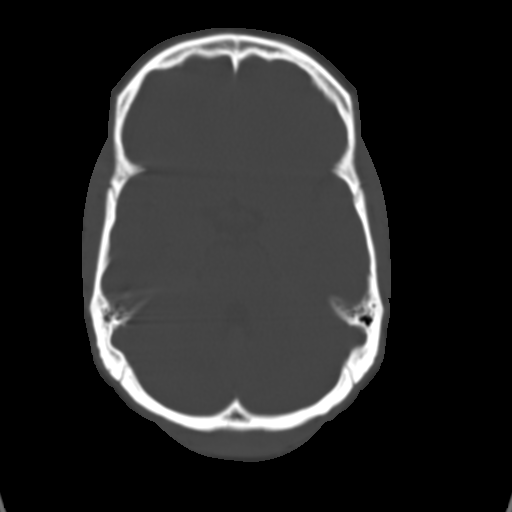
[im 11/32  brain]
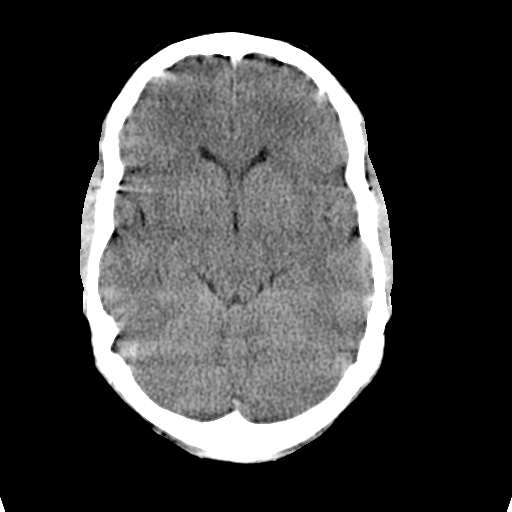
[im 13/32  brain]
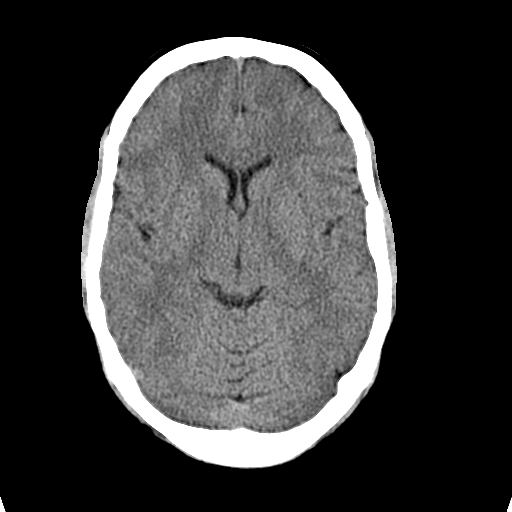
[im 15/32  brain]
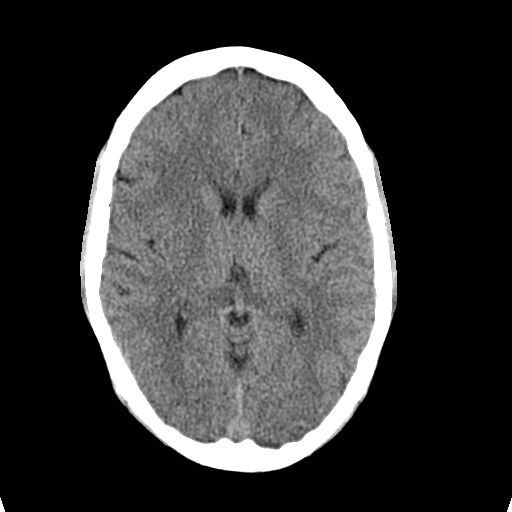
[im 17/32  brain]
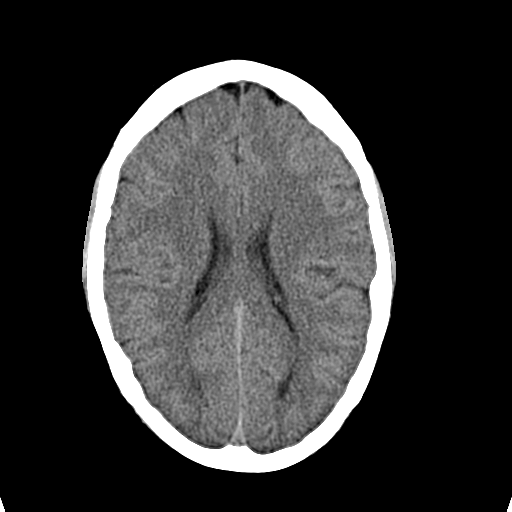
[im 17/32  bone]
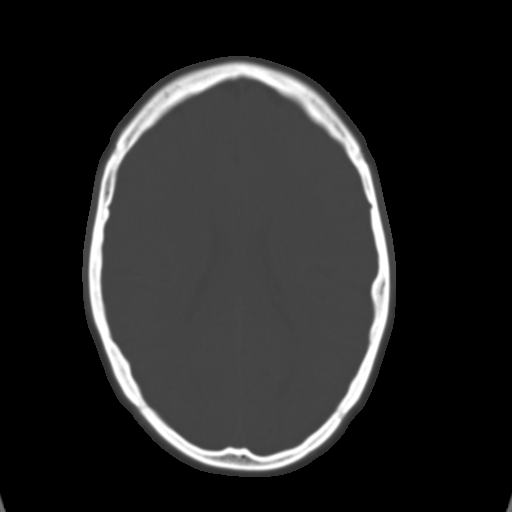
[im 19/32  brain]
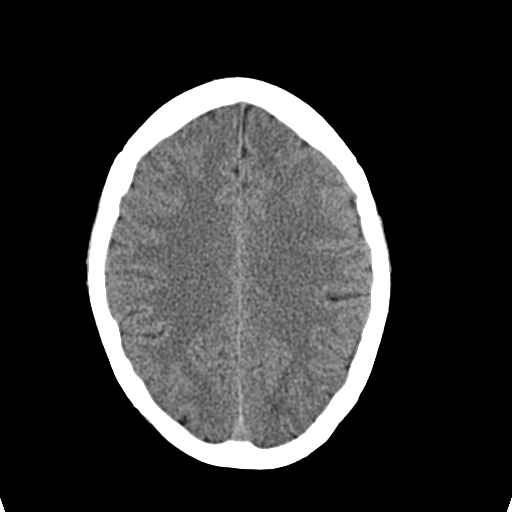
[im 21/32  brain]
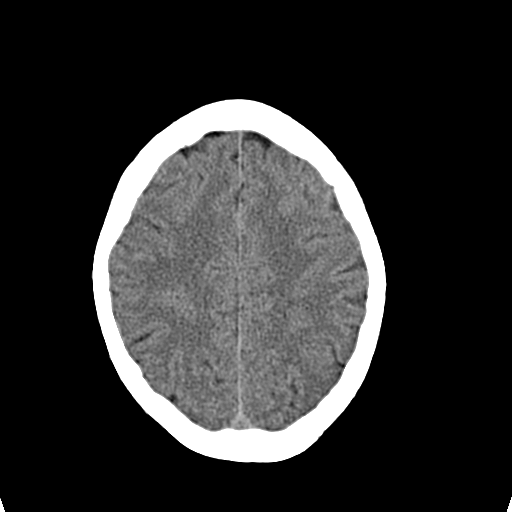
[im 23/32  brain]
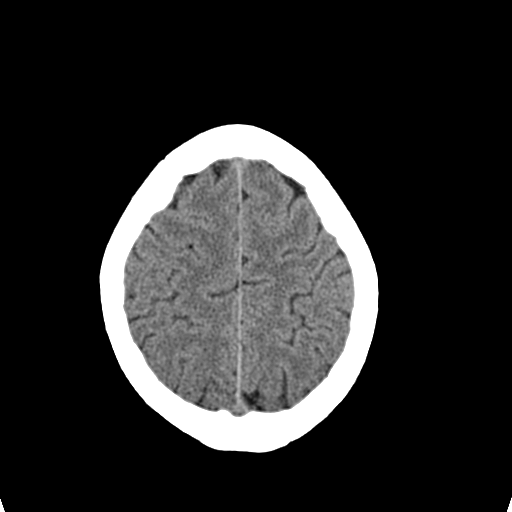
[im 24/32  brain]
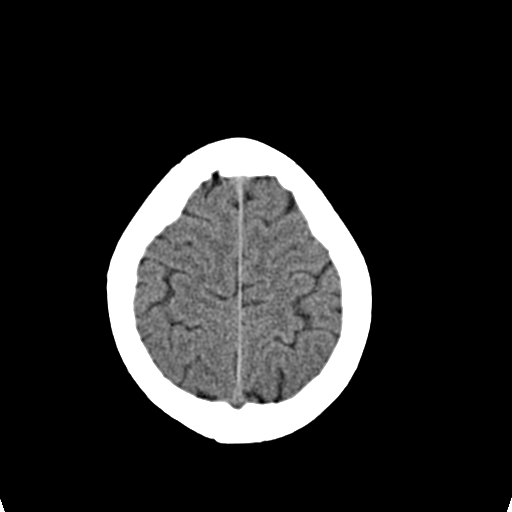
[im 24/32  bone]
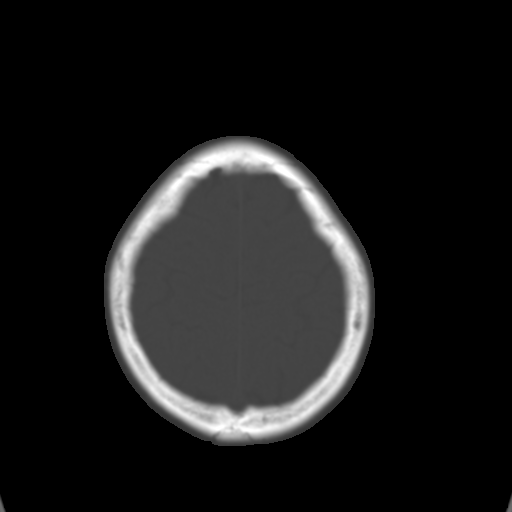
[im 26/32  brain]
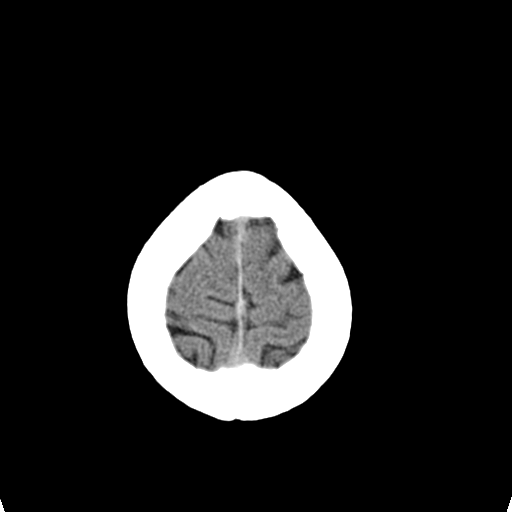
[im 28/32  brain]
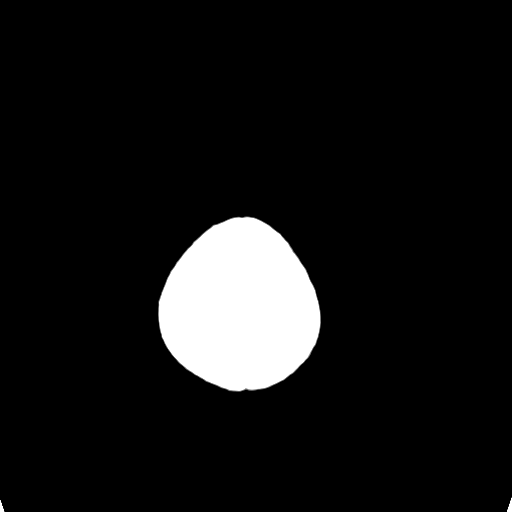
[im 30/32  brain]
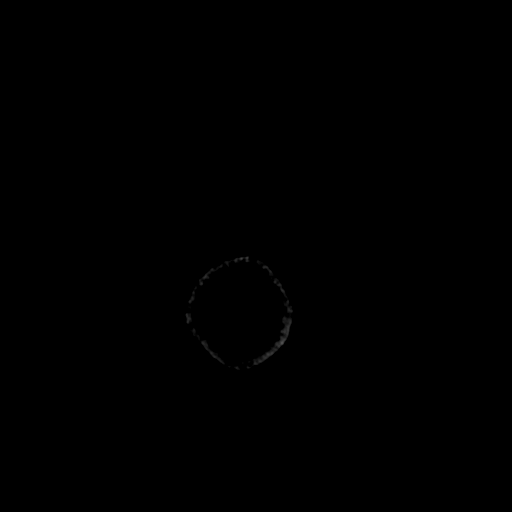

[16 of 30 positions shown; findings below may reference images not displayed]

FINDINGS: Normal appearance of the intracranial structures.  No
evidence for acute hemorrhage, mass lesion, midline shift,
hydrocephalus or large infarct.  No acute bony abnormality.  The
visualized sinuses are clear.
IMPRESSION: No acute intracranial abnormality.

## 2009-03-05 ENCOUNTER — Ambulatory Visit: Payer: Self-pay | Admitting: Internal Medicine

## 2009-04-12 ENCOUNTER — Ambulatory Visit: Payer: Self-pay | Admitting: Family Medicine

## 2009-05-06 ENCOUNTER — Ambulatory Visit: Payer: Self-pay | Admitting: Internal Medicine

## 2009-05-06 LAB — CONVERTED CEMR LAB
Glucose, Bld: 117 mg/dL
Hemoglobin: 12.8 g/dL

## 2009-05-08 ENCOUNTER — Telehealth: Payer: Self-pay | Admitting: Internal Medicine

## 2009-05-09 ENCOUNTER — Ambulatory Visit: Payer: Self-pay | Admitting: Cardiovascular Disease

## 2009-05-09 IMAGING — CT CT HEAD W/O CM
1 series · 16 of 30 positions shown, 20 images · non-contrast
Comparison: Head CT [DATE].

CLINICAL DATA: History of migraine headaches.  Headache, fever and
dizziness.

CT HEAD WITHOUT CONTRAST
TECHNIQUE: Contiguous axial images were obtained from the base of
the skull through the vertex without contrast.

[Series 2: head_seq 4.5 h37s st · axial · 0.43mm/px · z∈[+1078,+1207]mm · 16 of 32 slices shown, 20 images]
[im 2/32  brain]
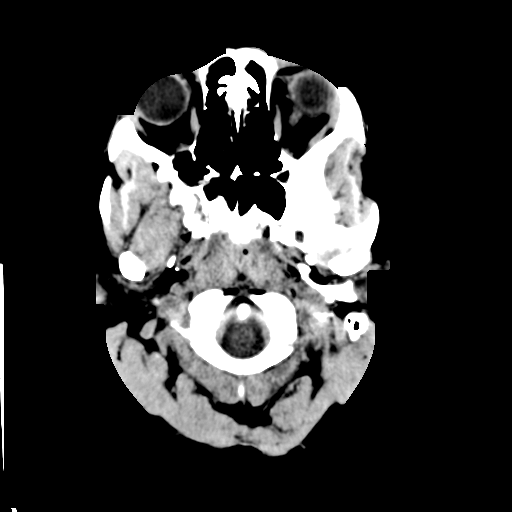
[im 2/32  bone]
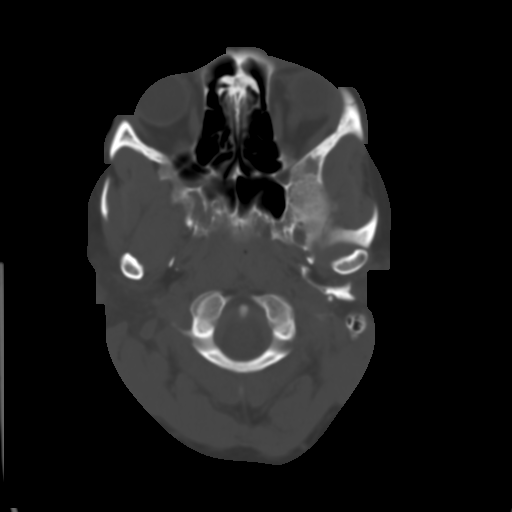
[im 4/32  brain]
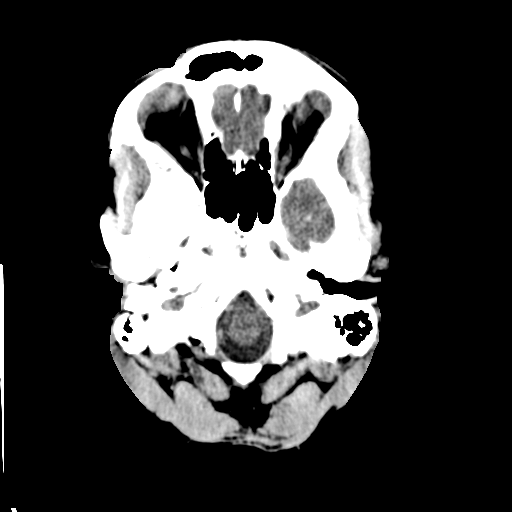
[im 6/32  brain]
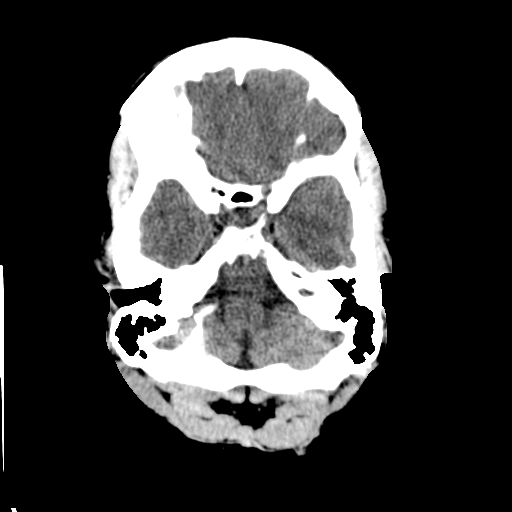
[im 8/32  brain]
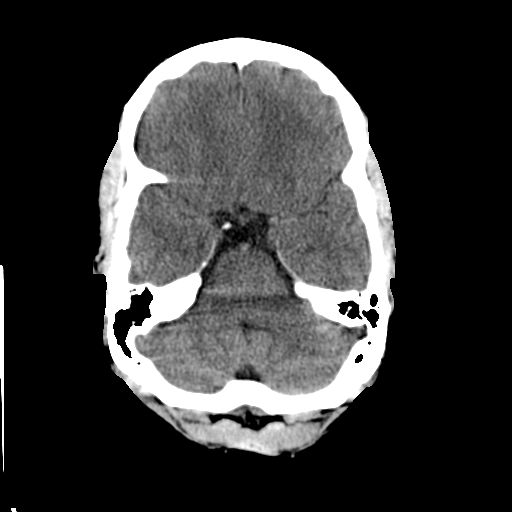
[im 9/32  brain]
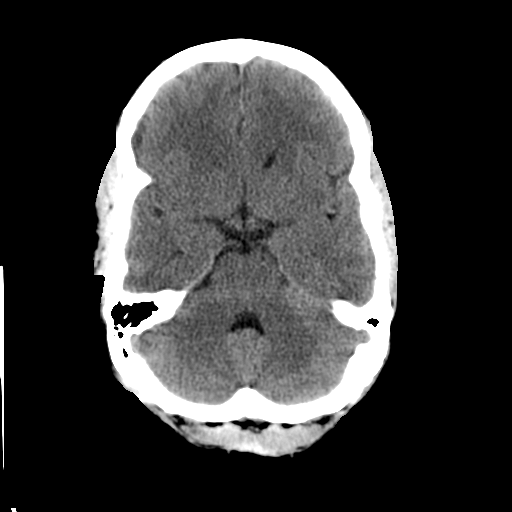
[im 9/32  bone]
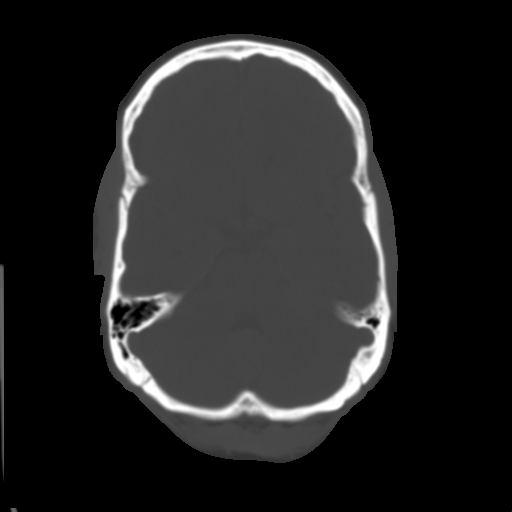
[im 11/32  brain]
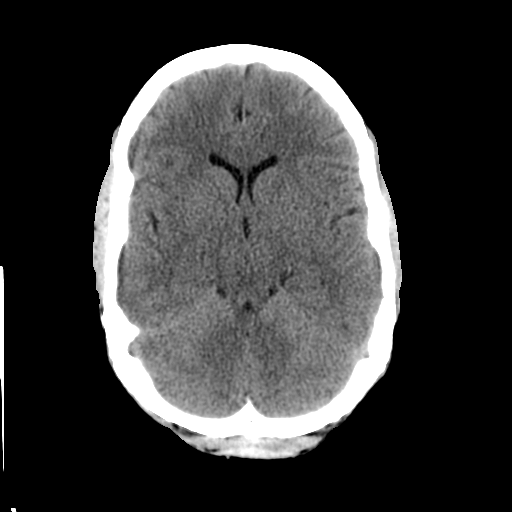
[im 13/32  brain]
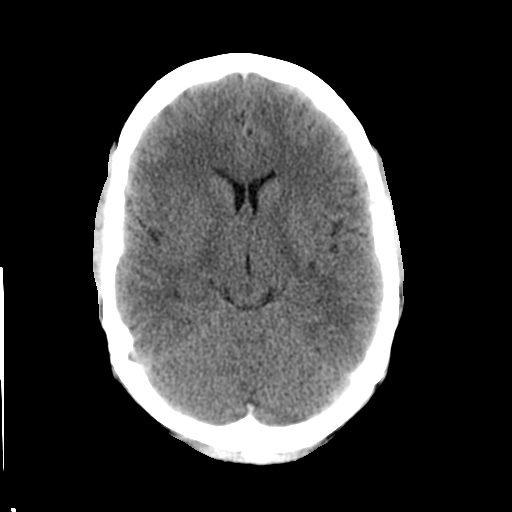
[im 15/32  brain]
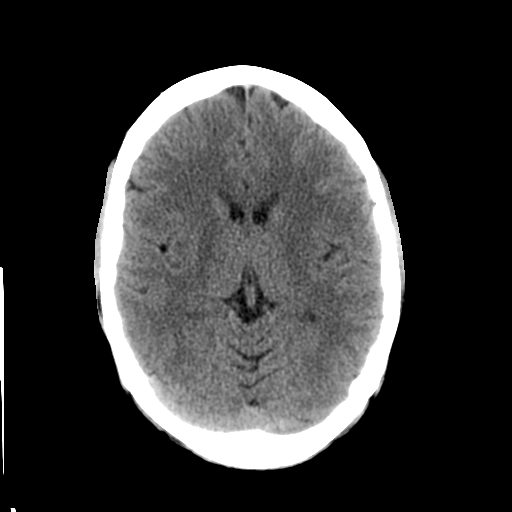
[im 17/32  brain]
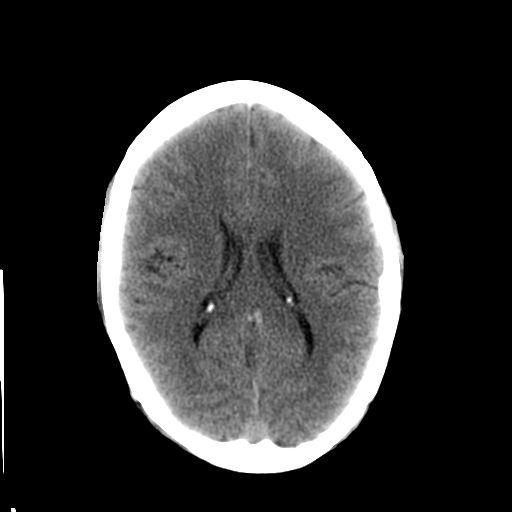
[im 17/32  bone]
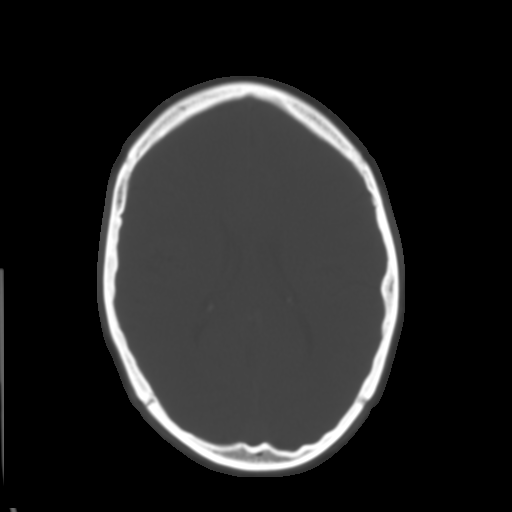
[im 19/32  brain]
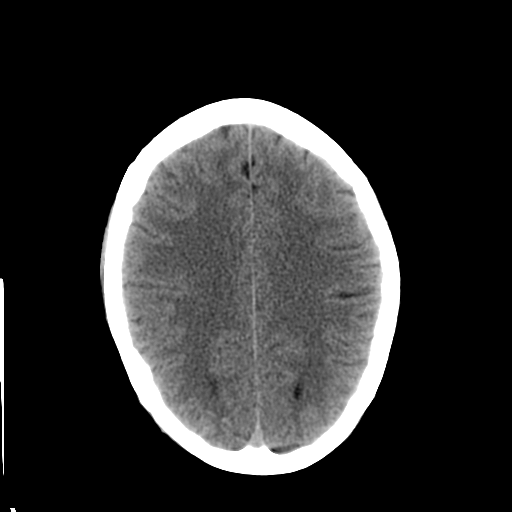
[im 21/32  brain]
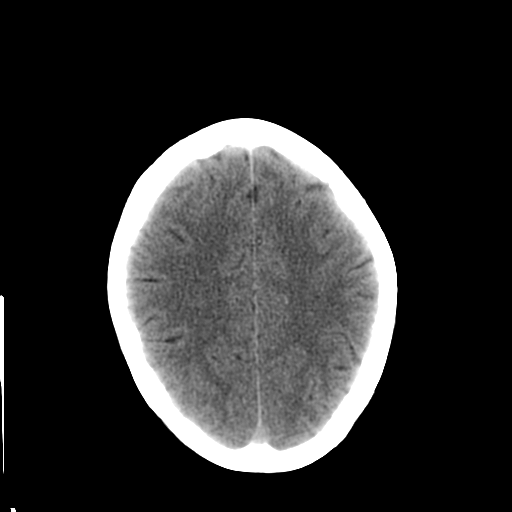
[im 23/32  brain]
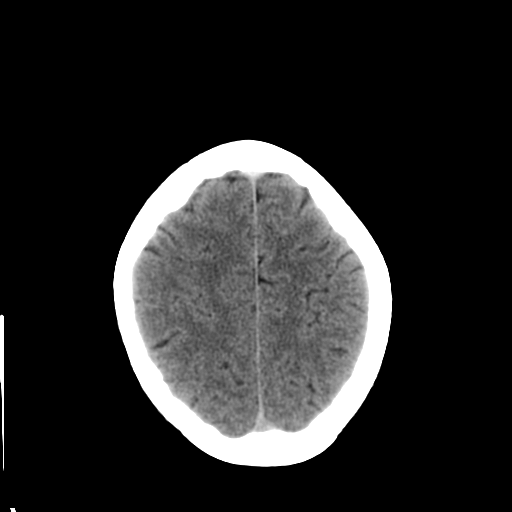
[im 24/32  brain]
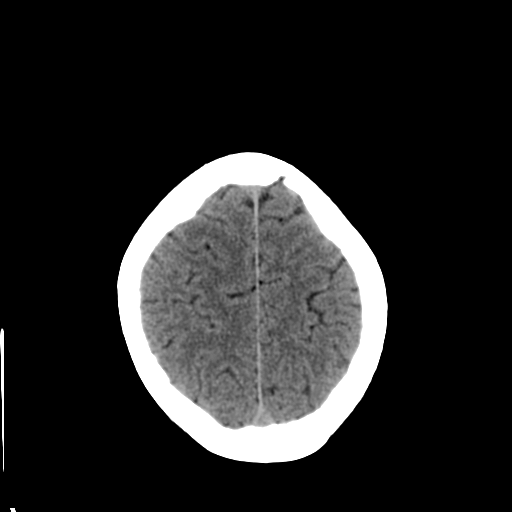
[im 24/32  bone]
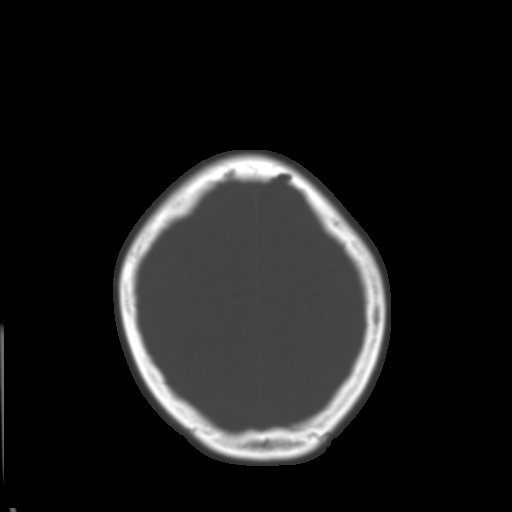
[im 26/32  brain]
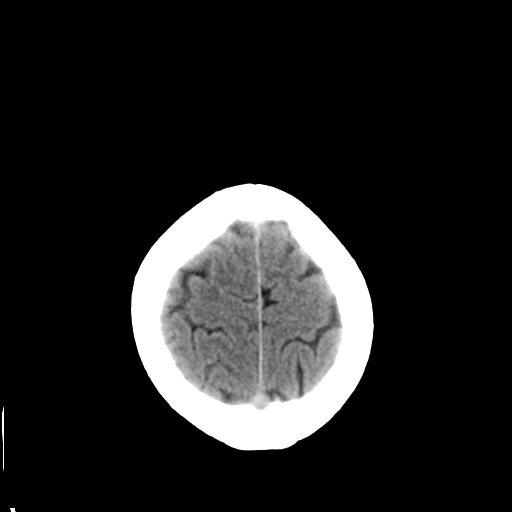
[im 28/32  brain]
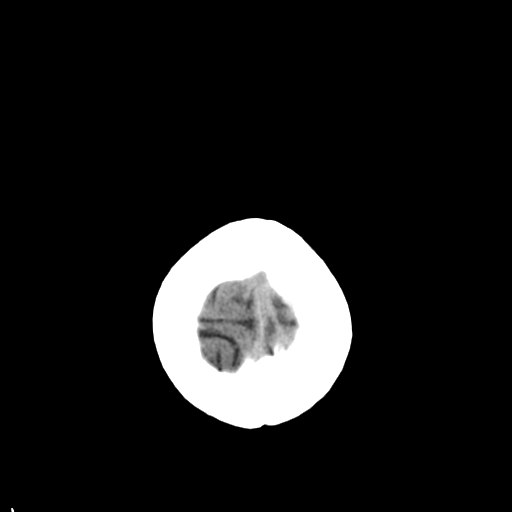
[im 30/32  brain]
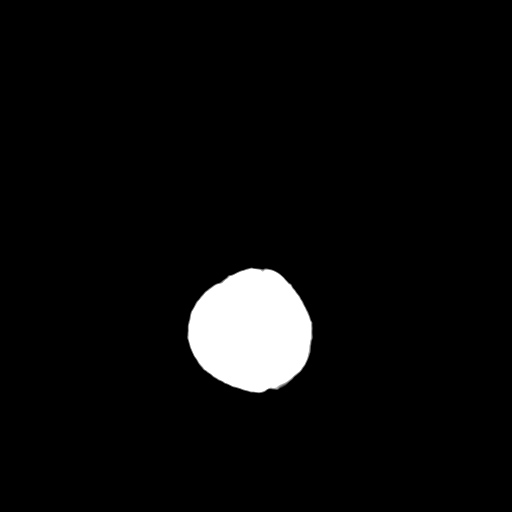

[16 of 30 positions shown; findings below may reference images not displayed]

FINDINGS: There is no evidence of acute intracranial hemorrhage,
mass lesion, brain edema or extra-axial fluid collection.  The
ventricles and subarachnoid spaces are appropriately sized for age.
There is no CT evidence of acute cortical infarction.

The visualized paranasal sinuses are clear.  The calvarium is
intact.
IMPRESSION: Stable examination.  No acute or significant intracranial findings.

## 2009-05-24 ENCOUNTER — Ambulatory Visit: Payer: Self-pay | Admitting: Internal Medicine

## 2009-06-12 ENCOUNTER — Emergency Department (HOSPITAL_COMMUNITY): Admission: EM | Admit: 2009-06-12 | Discharge: 2009-06-13 | Payer: Self-pay | Admitting: Pediatrics

## 2009-06-14 ENCOUNTER — Emergency Department (HOSPITAL_BASED_OUTPATIENT_CLINIC_OR_DEPARTMENT_OTHER): Admission: EM | Admit: 2009-06-14 | Discharge: 2009-06-14 | Payer: Self-pay | Admitting: Emergency Medicine

## 2009-06-14 ENCOUNTER — Ambulatory Visit: Payer: Self-pay | Admitting: Diagnostic Radiology

## 2009-06-14 IMAGING — CR DG CHEST 2V
2 series · 2 of 2 positions shown · non-contrast
Comparison: None

CLINICAL DATA: Chest pain

CHEST - 2 VIEW

[w chest pa]
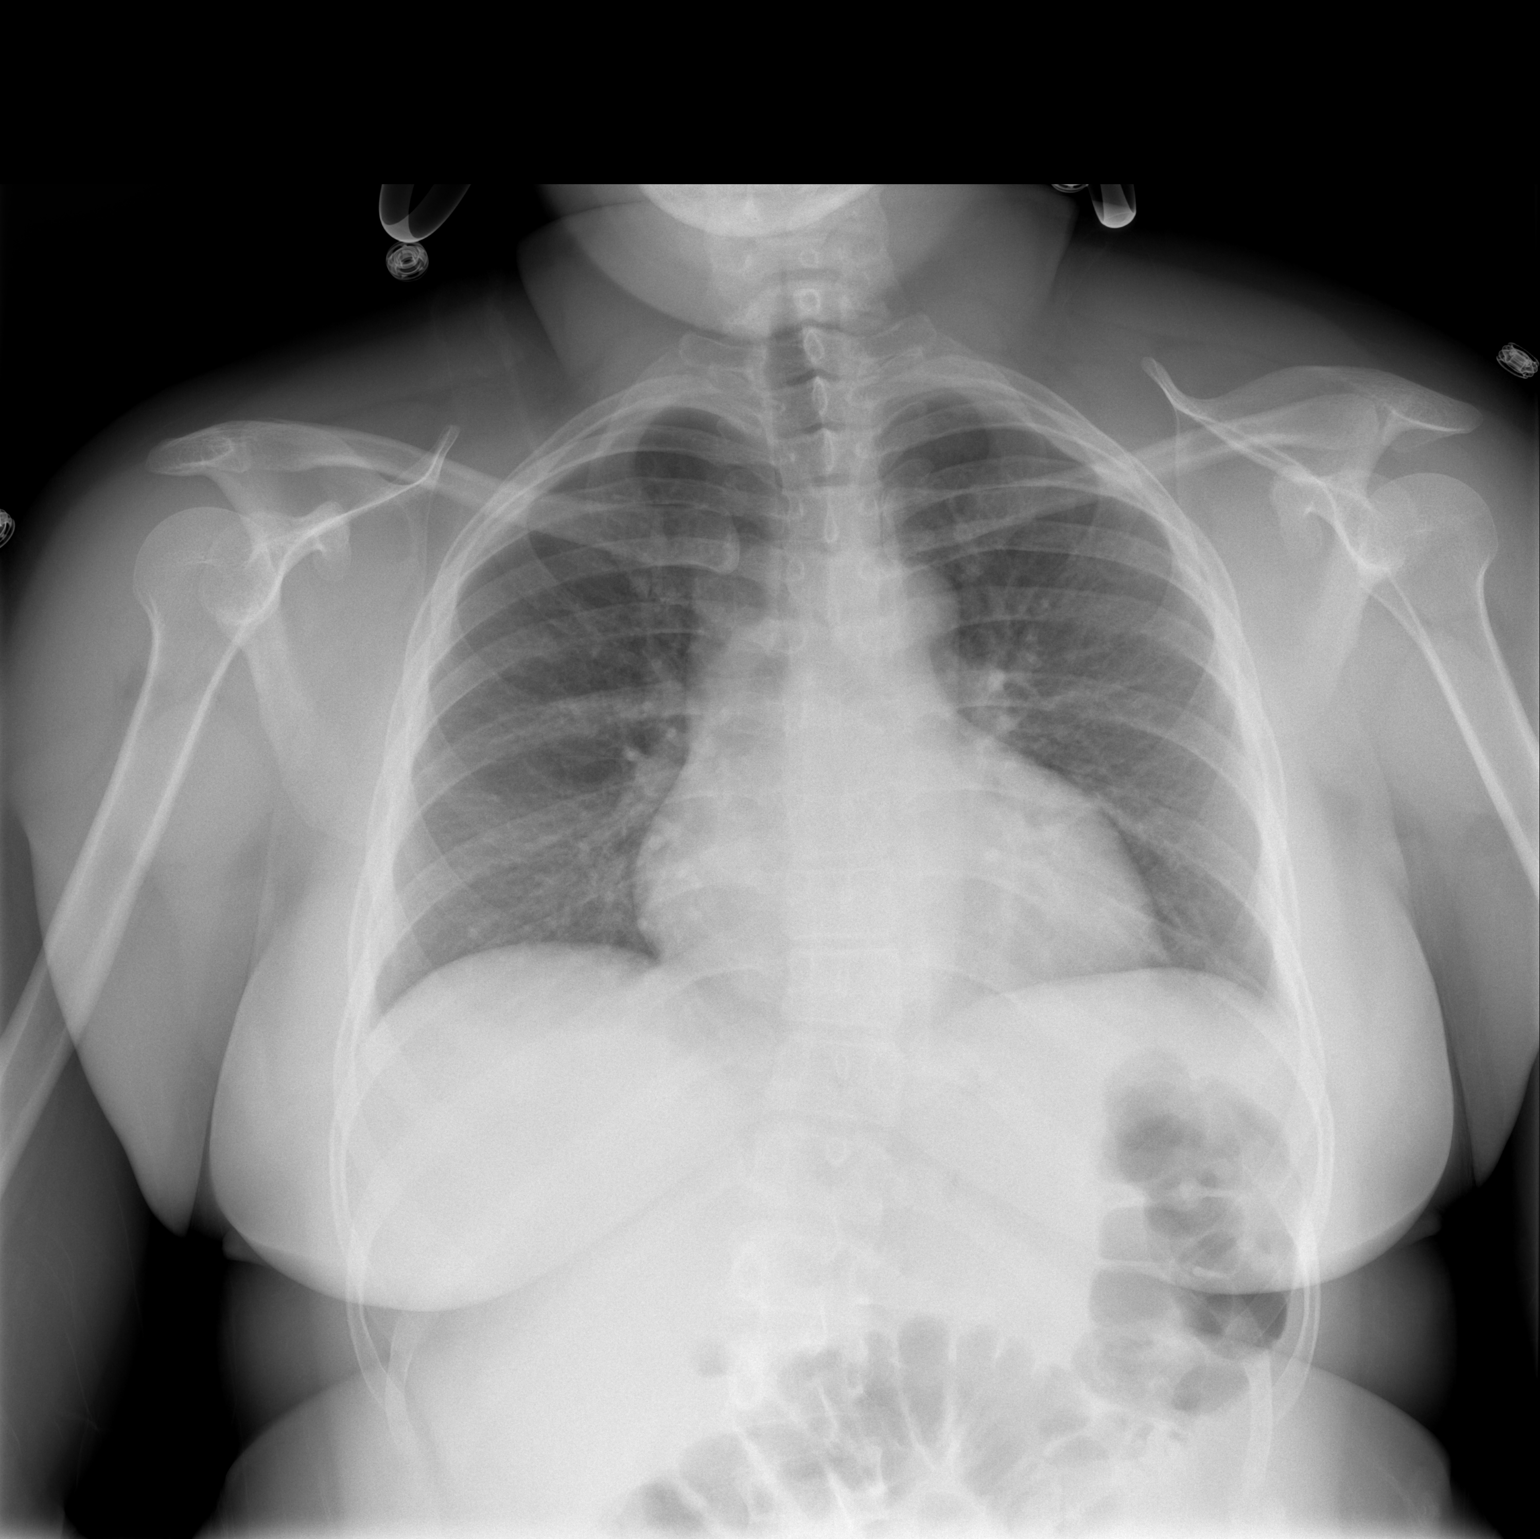

[w chest lat]
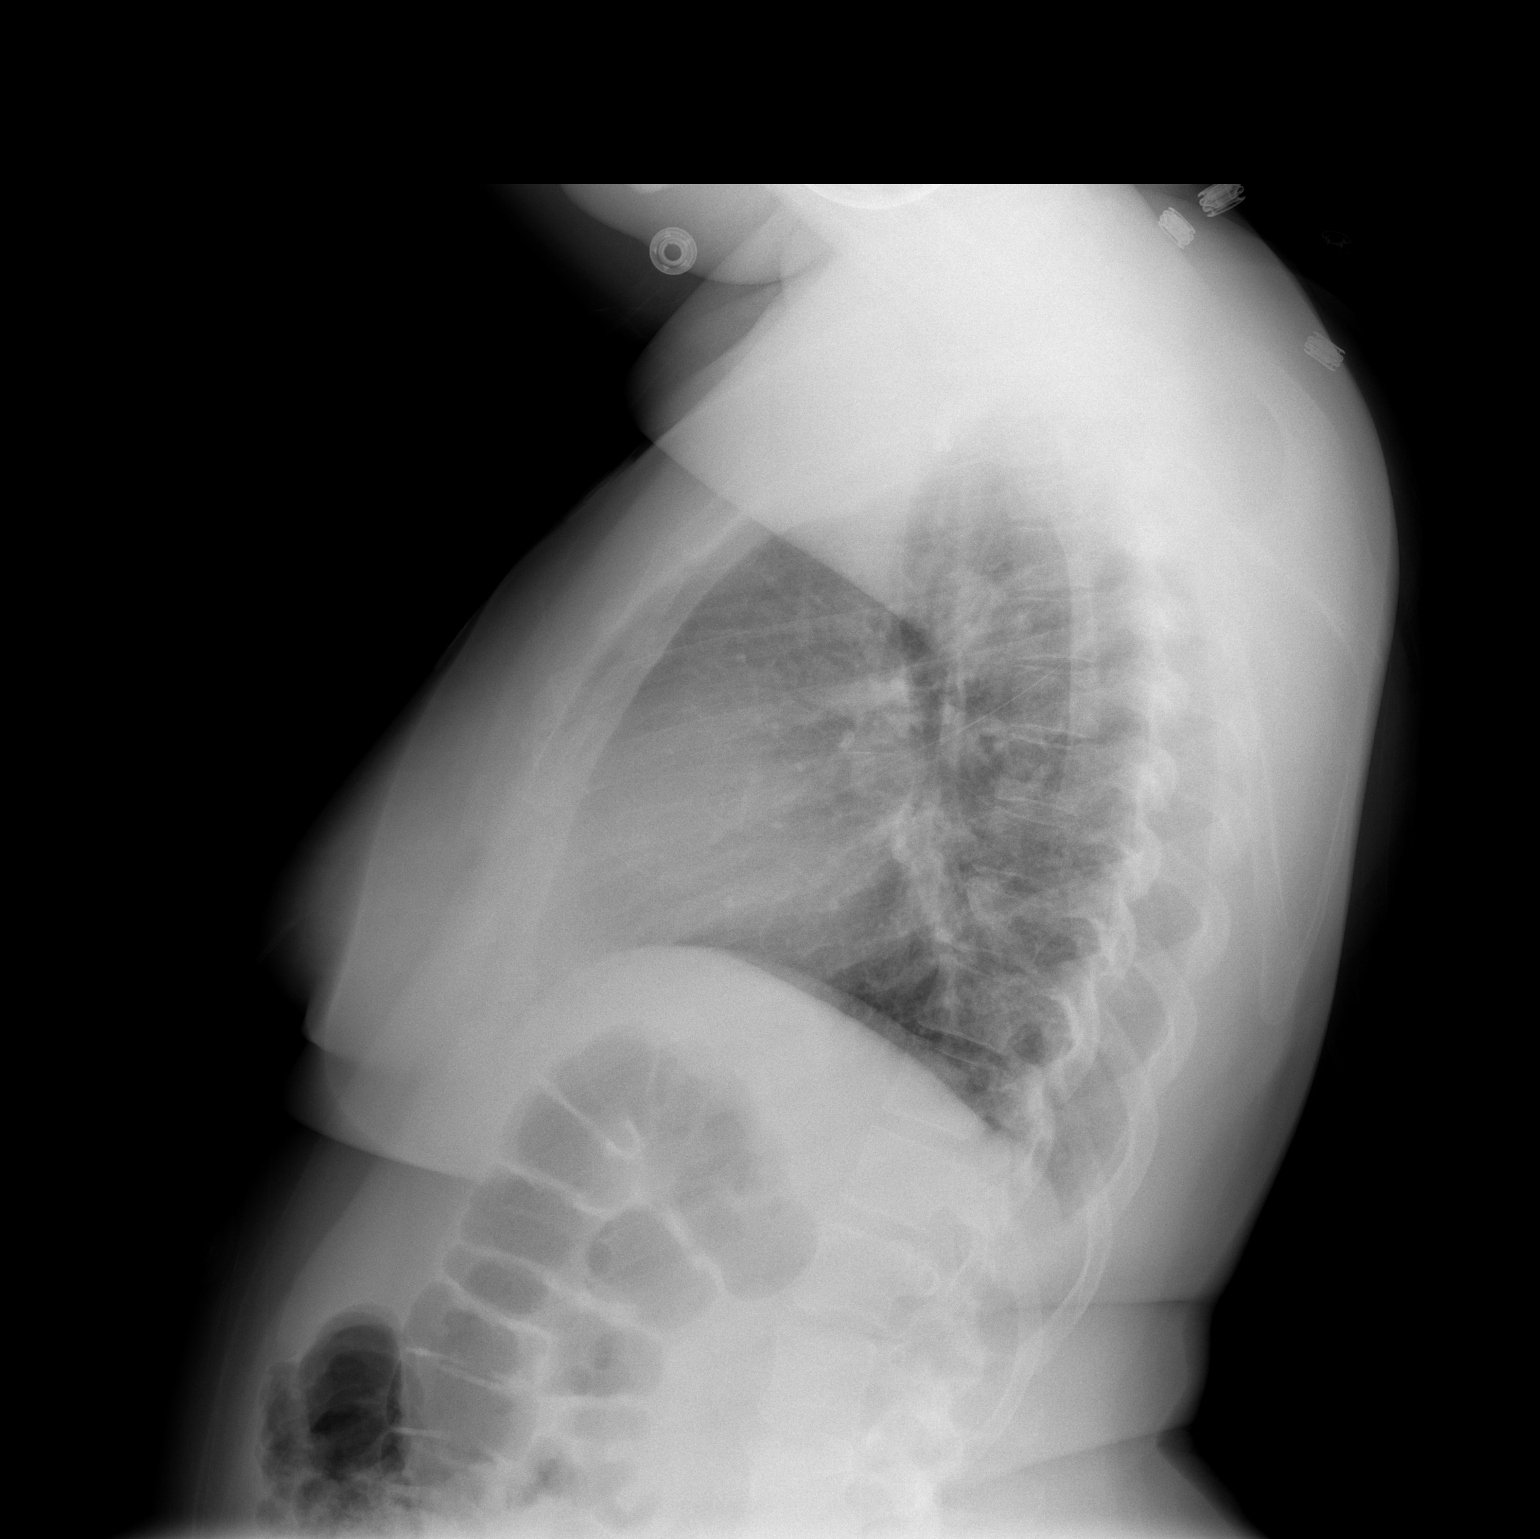

[2 of 2 positions shown; findings below may reference images not displayed]

FINDINGS: Heart size is upper normal.  There is no heart failure.
The lungs are clear without infiltrate or effusion.
IMPRESSION: No active cardiopulmonary disease.

## 2009-09-06 ENCOUNTER — Ambulatory Visit: Payer: Self-pay | Admitting: Family Medicine

## 2009-09-06 LAB — CONVERTED CEMR LAB: Whiff Test: NEGATIVE

## 2009-10-02 ENCOUNTER — Ambulatory Visit: Payer: Self-pay | Admitting: Internal Medicine

## 2009-10-02 LAB — CONVERTED CEMR LAB: Hemoglobin: 12.7 g/dL

## 2009-10-08 ENCOUNTER — Telehealth: Payer: Self-pay | Admitting: Internal Medicine

## 2009-12-09 ENCOUNTER — Ambulatory Visit: Payer: Self-pay | Admitting: Internal Medicine

## 2009-12-09 DIAGNOSIS — F411 Generalized anxiety disorder: Secondary | ICD-10-CM | POA: Insufficient documentation

## 2009-12-09 LAB — CONVERTED CEMR LAB
BUN: 4 mg/dL — ABNORMAL LOW (ref 6–23)
CO2: 31 meq/L (ref 19–32)
Calcium: 9.2 mg/dL (ref 8.4–10.5)
Chloride: 104 meq/L (ref 96–112)
Creatinine, Ser: 0.7 mg/dL (ref 0.4–1.2)
FSH: 6.6 milliintl units/mL
GFR calc non Af Amer: 121.7 mL/min (ref >60)
Glucose, Bld: 110 mg/dL — ABNORMAL HIGH (ref 70–99)
HCT: 40.5 % (ref 36.0–46.0)
Hemoglobin: 13.3 g/dL (ref 12.0–15.0)
LH: 12.39 milliintl units/mL
MCHC: 32.8 g/dL (ref 30.0–36.0)
MCV: 92.8 fL (ref 78.0–100.0)
Platelets: 167 10*3/uL (ref 150.0–400.0)
Potassium: 3.9 meq/L (ref 3.5–5.1)
RBC: 4.37 M/uL (ref 3.87–5.11)
RDW: 13.1 % (ref 11.5–14.6)
Sodium: 139 meq/L (ref 135–145)
TSH: 0.89 microintl units/mL (ref 0.35–5.50)
WBC: 3.3 10*3/uL — ABNORMAL LOW (ref 4.5–10.5)

## 2009-12-10 ENCOUNTER — Telehealth: Payer: Self-pay | Admitting: Internal Medicine

## 2010-01-01 ENCOUNTER — Ambulatory Visit: Payer: Self-pay | Admitting: Internal Medicine

## 2010-01-02 ENCOUNTER — Telehealth (INDEPENDENT_AMBULATORY_CARE_PROVIDER_SITE_OTHER): Payer: Self-pay | Admitting: *Deleted

## 2010-01-23 ENCOUNTER — Encounter: Payer: Self-pay | Admitting: Internal Medicine

## 2010-01-23 ENCOUNTER — Telehealth (INDEPENDENT_AMBULATORY_CARE_PROVIDER_SITE_OTHER): Payer: Self-pay | Admitting: *Deleted

## 2010-03-28 ENCOUNTER — Encounter: Payer: Self-pay | Admitting: Internal Medicine

## 2010-03-28 ENCOUNTER — Ambulatory Visit: Payer: Self-pay | Admitting: Family Medicine

## 2010-03-28 DIAGNOSIS — R079 Chest pain, unspecified: Secondary | ICD-10-CM | POA: Insufficient documentation

## 2010-04-11 ENCOUNTER — Ambulatory Visit: Payer: Self-pay | Admitting: Internal Medicine

## 2010-10-06 ENCOUNTER — Emergency Department (HOSPITAL_BASED_OUTPATIENT_CLINIC_OR_DEPARTMENT_OTHER)
Admission: EM | Admit: 2010-10-06 | Discharge: 2010-10-06 | Payer: Self-pay | Source: Home / Self Care | Admitting: Emergency Medicine

## 2011-01-15 NOTE — Assessment & Plan Note (Signed)
Summary: 3 WEEK FUP//CCM   Vital Signs:  Patient profile:   40 year old female Height:      59.5 inches Weight:      160.6 pounds Pulse rate:   60 / minute BP sitting:   120 / 80  Vitals Entered By: [name] (January 01, 2010 10:37 AM) CC: f/u ov - doing well Is Patient Diabetic? No Comments patient did not get xanax filled, she has been taking clonazepam.  She is feeling good & sleeping well [name]  January 01, 2010 10:46 AM    History of Present Illness: follow-up from previous visit, she was started on citalopram feels better sleeping better, less emotional, less nightmares still has some hot flashes  Current Medications (verified): 1)  Felodipine 10 Mg Xr24h-Tab (Felodipine) .Marland Kitchen.. 1 A Day 2)  Metoprolol Tartrate 25 Mg  Tabs (Metoprolol Tartrate) .... One P.o. B.i.d. 3)  Proair Hfa 108 (90 Base) Mcg/act Aers (Albuterol Sulfate) .... 2 Puffs Qid As Needed 4)  Citalopram Hydrobromide 20 Mg Tabs (Citalopram Hydrobromide) .Marland Kitchen.. 1 By Mouth Once Daily 5)  Clonazepam 0.5 Mg Tabs (Clonazepam) .Marland Kitchen.. 1-2 By Mouth At Bedtime As Needed Insomnia  Allergies (verified): 1)  ! Asa 2)  ! * Peanut Butter  Past History:  Past Medical History: Reviewed history from 12/09/2009 and no changes required. Gyn-- Dr Cherly Hensen G2 P2 Headache - developed migraines after 2nd pregnancy, had a negative CT of the head Anemia-NOS Hypertension    Past Surgical History: Reviewed history from 02/21/2008 and no changes required. Inguinal herniorrhaphy (12/14/2002) Caesarean section (12/14/1996) Caesarean section (12/14/2005) Hysterectomy 12-08 (no oophorectomy per surgical report)  Review of Systems       denies nausea, vomiting, diarrhea still cannot point to any triggers for anxiety her diet has improved, she is more active, has lost weight  Physical Exam  General:  alert and well-developed.   Psych:  Oriented X3, memory intact for recent and remote, normally interactive, good eye  contact, not anxious appearing, and not depressed appearing.     Impression & Recommendations:  Problem # 1:  ? of MENOPAUSAL SYNDROME (ICD-627.2) FSH and LH normal still has some hot flashes encouraged to discuss with gynecology  Problem # 2:  ANXIETY (ICD-300.00) improved, unclear what is triggering her anxiety but she is responding well to current medications she will call in 3 weeks and let me know if she feels like citalopram is working well enough for her, if it  is not will increase the dose office visit in two months  Her updated medication list for this problem includes:    Citalopram Hydrobromide 20 Mg Tabs (Citalopram hydrobromide) .Marland Kitchen... 1 by mouth once daily    Clonazepam 0.5 Mg Tabs (Clonazepam) .Marland Kitchen... 1-2 by mouth at bedtime as needed insomnia  Complete Medication List: 1)  Felodipine 10 Mg Xr24h-tab (Felodipine) .Marland Kitchen.. 1 a day 2)  Metoprolol Tartrate 25 Mg Tabs (Metoprolol tartrate) .... One p.o. b.i.d. 3)  Proair Hfa 108 (90 Base) Mcg/act Aers (Albuterol sulfate) .... 2 puffs qid as needed 4)  Citalopram Hydrobromide 20 Mg Tabs (Citalopram hydrobromide) .Marland Kitchen.. 1 by mouth once daily 5)  Clonazepam 0.5 Mg Tabs (Clonazepam) .Marland Kitchen.. 1-2 by mouth at bedtime as needed insomnia  Patient Instructions: 1)  Please schedule a follow-up appointment in 2 months.  Prescriptions: CLONAZEPAM 0.5 MG TABS (CLONAZEPAM) 1-2 by mouth at bedtime as needed insomnia  #60 x 1   Entered by:   [name]   Authorized by:  Jose E. Paz MD   Signed by:   [name] on 01/01/2010   Method used:   Print then Give to Patient   RxID:   [id number] CITALOPRAM HYDROBROMIDE 20 MG TABS (CITALOPRAM HYDROBROMIDE) 1 by mouth once daily  #30 x 5   Entered by:   [name]   Authorized by:   Nolon Rod. Paz MD   Signed by:   [name] on 01/01/2010   Method used:   Electronically to        [id number] (retail)       [address] [place], [place]  [address]       Ph:  [phone]       Fax: [phone]   RxID:   [id number]

## 2011-01-15 NOTE — Assessment & Plan Note (Signed)
Summary: 2 week fu/kdc   Vital Signs:  Patient profile:   40 year old female Height:      59.5 inches Weight:      159.2 pounds BMI:     31.73 Pulse rate:   76 / minute BP sitting:   140 / 80  Vitals Entered By: [name] (April 11, 2010 11:26 AM) CC: f/u on bp, bp this am 123/90, states she is feeling good   History of Present Illness: was seen recently with the elevated blood pressure, metoprolol was changed to byastolic she now feels well  Allergies: 1)  ! Asa 2)  ! * Peanut Butter  Past History:  Past Medical History: Reviewed history from 12/09/2009 and no changes required. Gyn-- Dr Cherly Hensen G2 P2 Headache - developed migraines after 2nd pregnancy, had a negative CT of the head Anemia-NOS Hypertension    Past Surgical History: Reviewed history from 02/21/2008 and no changes required. Inguinal herniorrhaphy (12/14/2002) Caesarean section (12/14/1996) Caesarean section (12/14/2005) Hysterectomy 12-08 (no oophorectomy per surgical report)  Review of Systems       denies chest pain, shortness of breath no palpitations ambulatory BPs rarely checked, today was 123/90  Physical Exam  General:  alert and well-developed.   Lungs:  Normal respiratory effort, chest expands symmetrically. Lungs are clear to auscultation, no crackles or wheezes. Heart:  normal rate, regular rhythm, and no murmur.   Extremities:  no lower extremity edema   Impression & Recommendations:  Problem # 1:  HYPERTENSION (ICD-401.9) changing from Metoprolol to byastolic has helped s/e  BP needs to be slightly lower change to byastolic 10 mg, 3 weeks of samples provided see instructions  cost may be an issue Her updated medication list for this problem includes:    Felodipine 10 Mg Xr24h-tab (Felodipine) .Marland Kitchen... 1 a day    Bystolic 10 Mg Tabs (Nebivolol hcl) .Marland Kitchen... 1 a day  Complete Medication List: 1)  Felodipine 10 Mg Xr24h-tab (Felodipine) .Marland Kitchen.. 1 a day 2)  Bystolic 10 Mg Tabs  (Nebivolol hcl) .Marland Kitchen.. 1 a day 3)  Proair Hfa 108 (90 Base) Mcg/act Aers (Albuterol sulfate) .... 2 puffs qid as needed 4)  Citalopram Hydrobromide 20 Mg Tabs (Citalopram hydrobromide) .Marland Kitchen.. 1 by mouth once daily  Patient Instructions: 1)  increase bystolic to 10mg  a day 2)  Check your blood pressure 2 or 3 times a week. If it is more than 140/85  or less than 105/60 consistently,please let us know  3)  Please schedule a follow-up appointment in 6 months .  Prescriptions: BYSTOLIC 10 MG TABS (NEBIVOLOL HCL) 1 a day  #90 x 2   Entered and Authorized by:   Elita Quick E. Mateo Overbeck MD   Signed by:   Nolon Rod. Esai Stecklein MD on 04/11/2010   Method used:   Print then Give to Patient   RxID:   [id number]

## 2011-01-15 NOTE — Assessment & Plan Note (Signed)
Summary: rto 2 months.cbs   Vital Signs:  Patient profile:   40 year old female Height:      59.5 inches Weight:      168 pounds Pulse rate:   68 / minute BP sitting:   160 / 110  (left arm) Cuff size:   large  Vitals Entered By: [name] (May 06, 2009 10:07 AM) CC: rov Comments c/o of HA this am & feeling "off balance" -- sxs just started about 1/2 hr ago [name]  May 06, 2009 10:13 AM    Serial Vital Signs/Assessments:  Time      Position  BP       Pulse  Resp  Temp     By 10:12 AM  R Arm     154/100                        [name]   History of Present Illness: she came here today for her ROV, as she was getting off her car and walking into the clinic she felt "imbalance" (denies spinning or even dizzines) and developed a mild HA at L temple which is different from her migraines denies nausea, diplopia or palpitations;no fever or URI symptoms  ambulatory BPs "normal most of the time"   Allergies: 1)  ! Asa 2)  ! * Peanut Butter  Past History:  Past Medical History:    Reviewed history from 10/19/2007 and no changes required:    G2 P2    Headache - developed migraines after 2nd pregnancy, had a negative CT of the head    Anemia-NOS    Hypertension  Past Surgical History:    Reviewed history from 02/21/2008 and no changes required:    Inguinal herniorrhaphy (12/14/2002)    Caesarean section (12/14/1996)    Caesarean section (12/14/2005)    Hysterectomy 12-08 (no oophorectomy per surgical report)  Review of Systems      See HPI  Physical Exam  General:  alert and well-developed.   Neck:  normal carotid upstroke.   Heart:  normal rate, regular rhythm, and no murmur.   Neurologic:  alert & oriented X3, cranial nerves II-XII intact, strength normal in all extremities, gait normal, and Romberg negative  (although she felt a little off balance standing and closing her eyes)  Psych:  not anxious appearing and not depressed appearing.     Impression  & Recommendations:  Problem # 1:  HYPERTENSION (ICD-401.9) BP well control?  see instructions (no charge for nurse visit)   Her updated medication list for this problem includes:    Felodipine 10 Mg Xr24h-tab (Felodipine) .Marland Kitchen... 1 a day    Metoprolol Tartrate 25 Mg Tabs (Metoprolol tartrate) ..... One p.o. b.i.d.  BP today: 160/110 Prior BP: 120/80 (04/12/2009)  Labs Reviewed: K+: 4.4 (02/21/2008) Creat: : 0.7 (02/21/2008)     Problem # 2:  Imbalance , HA symptoms resolved at this time (10.53 AM) observe call or go to the ER if symptoms severe  Complete Medication List: 1)  Felodipine 10 Mg Xr24h-tab (Felodipine) .Marland Kitchen.. 1 a day 2)  Metoprolol Tartrate 25 Mg Tabs (Metoprolol tartrate) .... One p.o. b.i.d. 3)  Proair Hfa 108 (90 Base) Mcg/act Aers (Albuterol sulfate) .... 2 puffs qid as needed  Other Orders: Hgb (85018) Glucose, (CBG) (04540) Fingerstick (98119)  Patient Instructions: 1)  Check your blood pressure 2 or 3 times a week. If it is more than 140/85  or less than  110/70 consistently,please let us know  2)  nurse visit 2 weeks for BP check  3)  Please schedule a follow-up appointment in 3 months .  Prescriptions: FELODIPINE 10 MG XR24H-TAB (FELODIPINE) 1 a day  #90 x 1   Entered and Authorized by:   Elita Quick E. Sander Remedios MD   Signed by:   Nolon Rod. Devann Cribb MD on 05/06/2009   Method used:   Print then Give to Patient   RxID:   [id number]   Laboratory Results   Blood Tests     Glucose (random): 117 mg/dL   (Normal Range: 14-782)   CBC   HGB:  12.8 g/dL   (Normal Range: 95.6-21.3 in Males, 12.0-15.0 in Females)

## 2011-01-15 NOTE — Assessment & Plan Note (Signed)
Summary: cough,cbs   Vital Signs:  Patient profile:   40 year old female Height:      59.5 inches Weight:      169 pounds O2 Sat:      100 % Temp:     98.2 degrees F oral Pulse rate:   82 / minute Resp:     18 per minute BP sitting:   120 / 80  (right arm)  Vitals Entered By: Ardyth Man (April 12, 2009 8:28 AM) CC: COUGH X 1 WEEK, HEADACHE, Cough Is Patient Diabetic? No Pain Assessment Patient in pain? no       Have you ever been in a relationship where you felt threatened, hurt or afraid?No   Does patient need assistance? Functional Status Self care Ambulation Normal   History of Present Illness:  Cough      This is a 40 year old woman who presents with Cough.  The symptoms began 1 week ago.  Pt here c/o 1 week hx of cough.  Pt started with migraine over weekend and then cough started and got worse.  The patient reports productive cough, but denies non-productive cough, pleuritic chest pain, shortness of breath, wheezing, exertional dyspnea, fever, hemoptysis, and malaise.  The patient denies the following symptoms: cold/URI symptoms, sore throat, nasal congestion, chronic rhinitis, weight loss, acid reflux symptoms, and peripheral edema.  The cough is worse with activity and lying down.  Ineffective prior treatments have included OTC cough medication.    Current Medications (verified): 1)  Felodipine 5 Mg  Tb24 (Felodipine) .... One P.o. Daily 2)  Metoprolol Tartrate 25 Mg  Tabs (Metoprolol Tartrate) .... One P.o. B.i.d. 3)  Zithromax Z-Pak 250 Mg Tabs (Azithromycin) .... 2 By Mouth Once Daily For 1 Day Then 1 By Mouth Once Daily For 4 More Days 4)  Proair Hfa 108 (90 Base) Mcg/act Aers (Albuterol Sulfate) .... 2 Puffs Qid As Needed  Allergies (verified): 1)  ! Asa 2)  ! * Peanut Butter  Past History:  Past medical, surgical, family and social histories (including risk factors) reviewed, and no changes noted (except as noted below).  Past Medical History:  Reviewed history from 10/19/2007 and no changes required:    G2 P2    Headache - developed migraines after 2nd pregnancy, had a negative CT of the head    Anemia-NOS    Hypertension  Past Surgical History:    Reviewed history from 02/21/2008 and no changes required:    Inguinal herniorrhaphy (12/14/2002)    Caesarean section (12/14/1996)    Caesarean section (12/14/2005)    Hysterectomy 12-08 (no oophorectomy per surgical report)  Family History:    Reviewed history from 05/12/2007 and no changes required:       Father: [employer]        Mother: DM, HTN, GLAUCOMA       Siblings: HTN, DM       CAD - MGM       STROKE - MGF         Social History:    Reviewed history from 02/21/2008 and no changes required:       Married  Review of Systems      See HPI  Physical Exam  General:  Well-developed,well-nourished,in no acute distress; alert,appropriate and cooperative throughout examination Ears:  External ear exam shows no significant lesions or deformities.  Otoscopic examination reveals clear canals, tympanic membranes are intact bilaterally without bulging, retraction, inflammation or discharge. Hearing is grossly normal bilaterally. Nose:  External nasal examination shows no deformity or inflammation. Nasal mucosa are pink and moist without lesions or exudates. Mouth:  Oral mucosa and oropharynx without lesions or exudates.  Teeth in good repair. Neck:  No deformities, masses, or tenderness noted. Lungs:  normal respiratory effort, R decreased breath sounds, and L decreased breath sounds.   Heart:  Normal rate and regular rhythm. S1 and S2 normal without gallop, murmur, click, rub or other extra sounds. Skin:  Intact without suspicious lesions or rashes Cervical Nodes:  No lymphadenopathy noted Psych:  Cognition and judgment appear intact. Alert and cooperative with normal attention span and concentration. No apparent delusions, illusions, hallucinations   Impression &  Recommendations:  Problem # 1:  BRONCHITIS- ACUTE (ICD-466.0)  The following medications were removed from the medication list:    Amoxicillin 500 Mg Tabs (Amoxicillin) .Marland Kitchen... 2 by mouth two times a day    Guaifenesin-codeine 100-10 Mg/20ml Liqd (Guaifenesin-codeine) .Marland Kitchen... 1 to 2 tsp by mouth at bedtime as needed cough Her updated medication list for this problem includes:    Zithromax Z-pak 250 Mg Tabs (Azithromycin) .Marland Kitchen... 2 by mouth once daily for 1 day then 1 by mouth once daily for 4 more days    Proair Hfa 108 (90 Base) Mcg/act Aers (Albuterol sulfate) .Marland Kitchen... 2 puffs qid as needed  Take antibiotics and other medications as directed. Encouraged to push clear liquids, get enough rest, and take acetaminophen as needed. To be seen in 5-7 days if no improvement, sooner if worse.  Complete Medication List: 1)  Felodipine 5 Mg Tb24 (Felodipine) .... One p.o. daily 2)  Metoprolol Tartrate 25 Mg Tabs (Metoprolol tartrate) .... One p.o. b.i.d. 3)  Zithromax Z-pak 250 Mg Tabs (Azithromycin) .... 2 by mouth once daily for 1 day then 1 by mouth once daily for 4 more days 4)  Proair Hfa 108 (90 Base) Mcg/act Aers (Albuterol sulfate) .... 2 puffs qid as needed  Other Orders: Xopenex 0.63mg  (Z6109) Nebulizer Tx (60454) Prescriptions: PROAIR HFA 108 (90 BASE) MCG/ACT AERS (ALBUTEROL SULFATE) 2 puffs qid as needed  #1 x 1   Entered and Authorized by:   Loreen Freud DO   Signed by:   Loreen Freud DO on 04/12/2009   Method used:   Electronically to        [facility] [id number]* (retail)       [address] [place], [place]  [address]       Ph: [phone]       Fax: [phone]   RxID:   [id number] ZITHROMAX Z-PAK 250 MG TABS (AZITHROMYCIN) 2 by mouth once daily for 1 day then 1 by mouth once daily for 4 more days  #1 pack x 0   Entered and Authorized by:   Loreen Freud DO   Signed by:   Loreen Freud DO on 04/12/2009   Method used:   Electronically to        [id number] (retail)       [address] [place], [place]  [address]       Ph: [phone]       Fax: [phone]   RxID:   [id number]    Medication Administration  Medication # 1:    Medication: Xopenex 0.63mg     Diagnosis: URI (ICD-465.9)    Dose: 0.63mg     Route: inhaled    Exp Date: 10/12/2009    Lot #:  [id number]    Mfr: sepracor    Patient tolerated medication without complications    Given by: Ardyth Man (April 12, 2009 11:36 AM)  Orders Added: 1)  Xopenex 0.63mg  [W1191] 2)  Nebulizer Tx [47829] 3)  Est. Patient Level IV [56213]

## 2011-01-15 NOTE — Assessment & Plan Note (Signed)
Summary: FOR HIGH BP--PH   Vital Signs:  Patient profile:   40 year old female Height:      59.5 inches Weight:      169.2 pounds BMI:     33.72 Pulse rate:   90 / minute Pulse rhythm:   regular BP sitting:   140 / 100  (left arm) Cuff size:   large  Vitals Entered By: [name] (March 05, 2009 3:59 PM) Comments Went to urgent care on 3/21 - still not feeling well:  - ST, scratchy throat -- rapid strep & cx were neg @ UC  - dry hacky cough  - not sleeping well  - bp was elevated @ UC -- 143/102 -- patient states she has been checking bp @ home 102/90; patient has been off bp meds since 7/09 [name]  March 05, 2009 4:01 PM    History of Present Illness: last OV 1 year ago ---[name] to urgent care on 3/21    - ST, scratchy throat x 5 days  -- rapid strep & cx were neg @ UC  - dry hacky cough  - not sleeping well  - bp was elevated @ UC -- 143/102 -- patient states she has been checking bp @ home 102/90; patient  ---has been off bp meds since 7/09 ( she just runned out) , her ambulatory BPs  were "ok" per patient but BP was noted to be high at the UC (see above)   Current Medications (verified): 1)  None  Allergies (verified): 1)  ! Asa 2)  ! * Peanut Butter  Past History:  Past Medical History:    Reviewed history from 10/19/2007 and no changes required:    G2 P2    Headache - developed migraines after 2nd pregnancy, had a negative CT of the head    Anemia-NOS    Hypertension  Past Surgical History:    Reviewed history from 02/21/2008 and no changes required:    Inguinal herniorrhaphy (12/14/2002)    Caesarean section (12/14/1996)    Caesarean section (12/14/2005)    Hysterectomy 12-08 (no oophorectomy per surgical report)  Social History:    Reviewed history from 02/21/2008 and no changes required:       Married  Review of Systems General:  Denies fever. Resp:  Denies sputum productive; some ant CP w/  cough.  Physical Exam  General:  alert and  well-developed.   Ears:  R ear normal and L ear normal.   Mouth:  no red or d/c  Lungs:  normal respiratory effort, no intercostal retractions, no accessory muscle use, and normal breath sounds.   Heart:  normal rate, regular rhythm, and no murmur.     Impression & Recommendations:  Problem # 1:  URI (ICD-465.9)  see instructions   Her updated medication list for this problem includes:    Guaifenesin-codeine 100-10 Mg/64ml Liqd (Guaifenesin-codeine) .Marland Kitchen... 1 to 2 tsp by mouth at bedtime as needed cough  Problem # 2:  HYPERTENSION (ICD-401.9)  off meds x months: re start meds  Her updated medication list for this problem includes:    Felodipine 5 Mg Tb24 (Felodipine) ..... One p.o. daily    Metoprolol Tartrate 25 Mg Tabs (Metoprolol tartrate) ..... One p.o. b.i.d.  BP today: 140/100 Prior BP: 120/80 (02/21/2008)  Labs Reviewed: K+: 4.4 (02/21/2008) Creat: : 0.7 (02/21/2008)     Complete Medication List: 1)  Felodipine 5 Mg Tb24 (Felodipine) .... One p.o. daily 2)  Metoprolol Tartrate 25 Mg  Tabs (Metoprolol tartrate) .... One p.o. b.i.d. 3)  Amoxicillin 500 Mg Tabs (Amoxicillin) .... 2 by mouth two times a day 4)  Guaifenesin-codeine 100-10 Mg/62ml Liqd (Guaifenesin-codeine) .Marland Kitchen.. 1 to 2 tsp by mouth at bedtime as needed cough  Patient Instructions: 1)  Get plenty of rest, drink lots of clear liquids, and use Tylenol 2)  mucinex DM or robitussin DM  for cough 3)  codeine for night time if the cough is severe 4)  amoxicillin x 1 week (antibiotic) 5)  re start your BP medication 6)  Return in 7-10 days if you're not better: sooner if you'er feeling worse.  7)  Please schedule a follow-up appointment in 2 months Prescriptions: METOPROLOL TARTRATE 25 MG  TABS (METOPROLOL TARTRATE) one p.o. b.i.d.  #60 x 3   Entered and Authorized by:   Nolon Rod. Meher Kucinski MD   Signed by:   Nolon Rod. Ayaan Ringle MD on 03/05/2009   Method used:   Print then Give to Patient   RxID:   [id number] FELODIPINE 5  MG  TB24 (FELODIPINE) one p.o. daily  #30 x 3   Entered and Authorized by:   Nolon Rod. Doshie Maggi MD   Signed by:   Nolon Rod. Laneya Gasaway MD on 03/05/2009   Method used:   Print then Give to Patient   RxID:   [id number] GUAIFENESIN-CODEINE 100-10 MG/5ML LIQD (GUAIFENESIN-CODEINE) 1 to 2 tsp by mouth at bedtime as needed cough  #200cc x 0   Entered and Authorized by:   Nolon Rod. Marg Macmaster MD   Signed by:   Nolon Rod. Zriyah Kopplin MD on 03/05/2009   Method used:   Print then Give to Patient   RxID:   [id number] AMOXICILLIN 500 MG TABS (AMOXICILLIN) 2 by mouth two times a day  #28 x 0   Entered and Authorized by:   Nolon Rod. Toshiko Kemler MD   Signed by:   Nolon Rod. Taneal Sonntag MD on 03/05/2009   Method used:   Print then Give to Patient   RxID:   [id number]

## 2011-01-15 NOTE — Assessment & Plan Note (Signed)
Summary: 1 week//tl   Vital Signs:  Patient Profile:   40 Years Old Female Height:     59.5 inches Weight:      145 pounds Pulse rate:   82 / minute Pulse rhythm:   regular BP sitting:   132 / 80  (left arm) Cuff size:   regular  Vitals Entered By: [name] (October 25, 2007 3:50 PM)                 Chief Complaint:  rov - bp has been 127/90 - 127/110; feels good; hysterectomy is scheduled for 12/10.  History of Present Illness: feels great  Current Allergies (reviewed today): ! ASA ! * PEANUT BUTTER     Review of Systems       DUB: bleeding stopped yesterday Hg was 9 at gyn office today LE edema: no    Physical Exam  General:     alert, well-developed, and well-nourished.   Lungs:     normal respiratory effort, no intercostal retractions, and no accessory muscle use.   Heart:     normal rate, regular rhythm, and no murmur.   Extremities:     no edema    Impression & Recommendations:  Problem # 1:  HYPERTENSION (ICD-401.9) well control continue present care  Her updated medication list for this problem includes:    Felodipine 5 Mg Tb24 (Felodipine) ..... One p.o. daily    Metoprolol Tartrate 25 Mg Tabs (Metoprolol tartrate) ..... One p.o. b.i.d.   Problem # 2:  ANEMIA-NOS (ICD-285.9) will fax this note to gyn (Dr. Cherly Hensen): pt ready for surgery as long as her BP remains well control   Her updated medication list for this problem includes:    Ferralet 90 90-1 Mg Tabs (Iron-folic acid-b12-c-docusate) ..... Qd   Problem # 3:  return to the office in 4 months  Complete Medication List: 1)  Ferralet 90 90-1 Mg Tabs (Iron-folic acid-b12-c-docusate) .... Qd 2)  Necon 0.5/35 (28) 0.5-35 Mg-mcg Tabs (Norethindrone-eth estradiol) 3)  Percocet 5-325 Mg Tabs (Oxycodone-acetaminophen) .... 2 every 6 hours prn 4)  Reglan 10 Mg Tabs (Metoclopramide hcl) .... Q4h 5)  Ketorolac Tromethamine 10 Mg Tabs (Ketorolac tromethamine) .... Q 6h 6)   Ibuprofen  7)  Felodipine 5 Mg Tb24 (Felodipine) .... One p.o. daily 8)  Metoprolol Tartrate 25 Mg Tabs (Metoprolol tartrate) .... One p.o. b.i.d.     Prescriptions: METOPROLOL TARTRATE 25 MG  TABS (METOPROLOL TARTRATE) one p.o. b.i.d.  #60 x 6   Entered and Authorized by:   Nolon Rod. Tanya Marvin MD   Signed by:   Nolon Rod. Berkley Wrightsman MD on 10/25/2007   Method used:   Print then Give to Patient   RxID:   [id number] FELODIPINE 5 MG  TB24 (FELODIPINE) one p.o. daily  #30 x 6   Entered and Authorized by:   Nolon Rod. Jessicia Napolitano MD   Signed by:   Nolon Rod. Jassica Zazueta MD on 10/25/2007   Method used:   Print then Give to Patient   RxID:   [id number]  ]  Appended Document: 1 week//tl faxed to dr cousins [phone]

## 2011-01-15 NOTE — Progress Notes (Signed)
Summary: FYI PT AGREEABLE TO REFERRAL  Phone Note Call from Patient Call back at Work Phone [phone]   Caller: Patient Complaint: Chest Pain Summary of Call: SAYS SHE WAS SUPPOSED TO CALL BACK IN THREE WEEKS TO REPORT HOW THE ANTIDEPRESSANT WAS WORKING (DID NOT KNOW HOW TO PRONOUNCE NAME--STARTS WITH A "C")  SHE SAYS IT IS ***NOT***  WORKING---WHAT SHOULD SHE DO NOW?   PLEASE CALL HER AS SOON AS POSSIBLE Initial call taken by: Jerolyn Shin,  January 23, 2010 9:07 AM  Follow-up for Phone Call        Spoke with pt who says the Citalopram is not working, she is still having crazy nightmares and crying.  Seen 01/01/10, was told to call. Follow-up by: Kandice Hams,  January 23, 2010 9:22 AM  Additional Follow-up for Phone Call Additional follow up Details #1::        increase citalopram 20mg  to 1.5 tab a day x 1 week, then 2 tabs a day. Needs counseling , referal if patient agreable Jose E. Paz MD  January 23, 2010 9:28 AM   LINE BUSY.Kandice Hams  January 23, 2010 11:24 am Spoke with pt informed of Dr Drue Novel recommendations about meds, PT AGREEABLE TO COUNSELING REFERRAL-- sent to [facility]  Additional Follow-up by: Kandice Hams,  January 23, 2010 11:31 AM    Additional Follow-up for Phone Call Additional follow up Details #2::    PATIENT'S INFO FAXED TO [facility] ON 01-23-2010 @ 11:42AM.  [facility] CONTACTS THE PATIENTS TO SCH'D THEM FOR APPOINTMENTS. Follow-up by: [facility],  January 24, 2010 5:40 PM

## 2011-01-15 NOTE — Letter (Signed)
Summary: Historic Patient File  Historic Patient File   Imported By: Freddy Jaksch 05/17/2007 10:21:26  _____________________________________________________________________  External Attachment:    Type:   Image     Comment:   REGISTRATION RECORDS

## 2011-01-15 NOTE — Progress Notes (Signed)
Summary: seen yesterday med not working//Abigail Campos  Phone Note Call from Patient Call back at Work Phone [phone]   Caller: Patient Summary of Call: Pt called was seen yesterday was given Clonazepam for insomnia, took 2 at 8:30 last night, up all night have not slept. Pt says she is at work now, and starting to feel sleepy. Wants to know should med be inceased? or too strong Initial call taken by: Abigail Campos,  December 10, 2009 11:14 AM  Follow-up for Phone Call        advise patient:  Hormon levels were normal, thus will try SSRI for anxiety plan: Start citalopram 20 mg half tablet at bedtime for one week, then one tablet at bedtime daily. Call #30, no RF Switch from  clonazepam to Xanax 0.5 mg one or two p.o. nightly p.r.n. #30 follow-up 3 weeks Follow-up by: Nolon Rod. Johneric Mcfadden MD,  December 10, 2009 5:01 PM  Additional Follow-up for Phone Call Additional follow up Details #1::        discussed with pt Abigail Campos  December 10, 2009 5:09 PM     New/Updated Medications: CITALOPRAM HYDROBROMIDE 20 MG TABS (CITALOPRAM HYDROBROMIDE) 1/2 by mouth at bedtime x 1 week, then increase to 1 tablet.  NEEDS OFFICE VISIT IN 3 WEEKS ALPRAZOLAM 0.5 MG TABS (ALPRAZOLAM) 1-2 by mouth at bedtime as needed INSOMNIA - STOP CLONAZEPAM!! Prescriptions: ALPRAZOLAM 0.5 MG TABS (ALPRAZOLAM) 1-2 by mouth at bedtime as needed INSOMNIA - STOP CLONAZEPAM!!  #30 x 0   Entered by:   Abigail Campos   Authorized by:   Nolon Rod. Jordana Dugue MD   Signed by:   Abigail Campos on 12/10/2009   Method used:   Printed then faxed to ...       [facility] [id number]* (retail)       [address] [place], [place]  [address]       Ph: [phone]       Fax: [phone]   RxID:   [id number] CITALOPRAM HYDROBROMIDE 20 MG TABS (CITALOPRAM HYDROBROMIDE) 1/2 by mouth at bedtime x 1 week, then increase to 1 tablet.  NEEDS OFFICE VISIT IN 3 WEEKS  #30 x 0   Entered by:   Abigail Campos   Authorized by:   Nolon Rod. Garnetta Fedrick MD  Signed by:   Abigail Campos on 12/10/2009   Method used:   Printed then faxed to ...       [id number]* (retail)       [address] [place], [place]  [address]       Ph: [phone]       Fax: [phone]   RxID:   [id number]

## 2011-01-15 NOTE — Letter (Signed)
Summary: [place] No Show Letter  [facility] [place]  [address] [place], [place] [address]   Phone: [phone]  Fax: [phone]    06/25/2008 MRN: [id number]  CHERISSA HOOK [address] [place], [place]  [address]   Dear Ms. Bramer,   Our records indicate that you missed your scheduled appointment with ___Dr. Paz_______________ on ____7/13/09________.  Please contact this office to reschedule your appointment as soon as possible.  It is important that you keep your scheduled appointments with your physician, so we can provide you the best care possible.  Please be advised that there may be a charge for "no show" appointments.    Sincerely,   [place] [facility]

## 2011-01-15 NOTE — Progress Notes (Signed)
Summary: Phone  Phone Note Call from Patient Call back at Home Phone [phone]   Caller: Patient Summary of Call: Patient  states she lost inhaler and wants to know if she can come get a sample or can  she have a prescription. Pharmacy [facility] [address]. Patient states she is having congestion. Initial call taken by: Barb Merino,  January 02, 2010 4:14 PM  Follow-up for Phone Call        pt iformed rx sent electronically to [facility] .Kandice Hams  January 02, 2010 4:43 PM  Follow-up by: Kandice Hams,  January 02, 2010 4:43 PM    Prescriptions: PROAIR HFA 108 (90 BASE) MCG/ACT AERS (ALBUTEROL SULFATE) 2 puffs qid as needed  #1 x 1   Entered by:   Kandice Hams   Authorized by:   Nolon Rod. Paz MD   Signed by:   Kandice Hams on 01/02/2010   Method used:   Faxed to ...       [id number]* (retail)       [address] [place], [place]  [address]       Ph: [phone]       Fax: [phone]   RxID:   [id number]

## 2011-01-15 NOTE — Progress Notes (Signed)
Summary: calling to check on pt  Phone Note Outgoing Call Call back at [facility] Phone [phone]   Summary of Call: Called pt -- she is still feeling a "little off balance" bp today was 180/90.  Patient states a friend of hers recently passsed away so she has been a little stressed about that.  She states that she is going to keep checking bp & give new med time to work.  Advised pt to call me Fri am to let me know her bp & how she is feeling [name]  May 08, 2009 1:44 PM   Follow-up for Phone Call        please arrange CT head w/o, Dx dizzines-HA Aldine Grainger E. Aunesti Pellegrino MD  May 08, 2009 2:37 PM   order done -- discussed with pt [name]  May 08, 2009 3:06 PM

## 2011-01-15 NOTE — Assessment & Plan Note (Signed)
Summary: MOUTH ABCESS INSIDE OF MOUTH////SPH   Vital Signs:  Patient profile:   40 year old female Height:      59.5 inches Weight:      166.2 pounds Temp:     97.7 degrees F Pulse rate:   80 / minute BP sitting:   120 / 80  Vitals Entered By: [name] (October 02, 2009 11:37 AM) CC: acute Comments  - sore in mouth x 10/18  - pt unable to get in to see dentist  - feeling drained & tired x 10/17 (pt request hgb) ...Marland KitchenMarland KitchenShary Decamp  October 02, 2009 11:49 AM    History of Present Illness: as above , can't afford to see a dentist  Current Medications (verified): 1)  Felodipine 10 Mg Xr24h-Tab (Felodipine) .Marland Kitchen.. 1 A Day 2)  Metoprolol Tartrate 25 Mg  Tabs (Metoprolol Tartrate) .... One P.o. B.i.d. 3)  Proair Hfa 108 (90 Base) Mcg/act Aers (Albuterol Sulfate) .... 2 Puffs Qid As Needed 4)  Nupercainal 1 % Oint (Dibucaine) .... Apply 3-4x/day As Needed For Pain.  Disp 60 Grams 5)  Analpram-Hc 1-2.5 % Crea (Hydrocortisone Ace-Pramoxine) .... Apply 3-4 Times/day As Needed For Pain and Inflammation.  Disp 30 Grams  Allergies (verified): 1)  ! Asa 2)  ! * Peanut Butter  Past History:  Past Medical History: Reviewed history from 10/19/2007 and no changes required. G2 P2 Headache - developed migraines after 2nd pregnancy, had a negative CT of the head Anemia-NOS Hypertension  Past Surgical History: Reviewed history from 02/21/2008 and no changes required. Inguinal herniorrhaphy (12/14/2002) Caesarean section (12/14/1996) Caesarean section (12/14/2005) Hysterectomy 12-08 (no oophorectomy per surgical report)  Review of Systems       denies fever, or dental pain. No facial swelling, or diplopia. as far as the high blood pressure, her diet has improved , BP today is very good  Physical Exam  General:  alert and well-developed.   Mouth:  in general, very poor dentition upper, right sided gum : + swelling, apparently, she had an abscess that already drained. throat is  otherwise normal and symmetric  Lungs:  normal respiratory effort, R decreased breath sounds, and L decreased breath sounds.   Heart:  normal rate, regular rhythm, and no murmur.     Impression & Recommendations:  Problem # 1:  ABSCESS, TOOTH (ICD-522.5) discussed with patient that this condition could be potentially very serious. She is to go to the ER if abscess increase or if she has facial swelling. Start antibiotics Diflucan in case she gets a yeast  infection. She needs to see a dentist ASAP , Kendell Bane ?  Problem # 2:  HYPERTENSION (ICD-401.9) BP very well today.  Follow-up in 4 months Her updated medication list for this problem includes:    Felodipine 10 Mg Xr24h-tab (Felodipine) .Marland Kitchen... 1 a day    Metoprolol Tartrate 25 Mg Tabs (Metoprolol tartrate) ..... One p.o. b.i.d.  BP today: 120/80 Prior BP: 116/70 (09/06/2009)  Labs Reviewed: K+: 4.4 (02/21/2008) Creat: : 0.7 (02/21/2008)     Complete Medication List: 1)  Felodipine 10 Mg Xr24h-tab (Felodipine) .Marland Kitchen.. 1 a day 2)  Metoprolol Tartrate 25 Mg Tabs (Metoprolol tartrate) .... One p.o. b.i.d. 3)  Proair Hfa 108 (90 Base) Mcg/act Aers (Albuterol sulfate) .... 2 puffs qid as needed 4)  Nupercainal 1 % Oint (Dibucaine) .... Apply 3-4x/day as needed for pain.  disp 60 grams 5)  Analpram-hc 1-2.5 % Crea (Hydrocortisone ace-pramoxine) .... Apply 3-4 times/day as needed for pain  and inflammation.  disp 30 grams 6)  Augmentin 500-125 Mg Tabs (Amoxicillin-pot clavulanate) .Marland Kitchen.. 1 by mouth three times a day 7)  Diflucan 150 Mg Tabs (Fluconazole) .Marland Kitchen.. 1 by mouth once daily as needed for yeast infection  Other Orders: Hgb (56213) Fingerstick (08657)  Patient Instructions: 1)  Please schedule a follow-up appointment in 4 months .  Prescriptions: DIFLUCAN 150 MG TABS (FLUCONAZOLE) 1 by mouth once daily as needed for yeast infection  #1 x 0   Entered and Authorized by:   Nolon Rod. Charlotta Lapaglia MD   Signed by:   Nolon Rod. Kanchan Gal MD on  10/02/2009   Method used:   Print then Give to Patient   RxID:   [id number] AUGMENTIN 500-125 MG TABS (AMOXICILLIN-POT CLAVULANATE) 1 by mouth three times a day  #30 x 0   Entered and Authorized by:   Nolon Rod. Aundria Bitterman MD   Signed by:   Nolon Rod. Oneill Bais MD on 10/02/2009   Method used:   Print then Give to Patient   RxID:   [id number]   Laboratory Results   CBC   HGB:  12.7 g/dL   (Normal Range: 42.5-95.6 in Males, 12.0-15.0 in Females)   Kit Test Internal QC: Positive   (Normal Range: Negative)

## 2011-01-15 NOTE — Assessment & Plan Note (Signed)
Summary: 5months//tl   Vital Signs:  Patient Profile:   40 Years Old Female Height:     59.5 inches Weight:      156.6 pounds Pulse rate:   80 / minute BP sitting:   120 / 80  Vitals Entered By: [name] (February 21, 2008 3:15 PM)                 Chief Complaint:  rov - c/o wt gain since hysterectomy 12/08; pt is exercising atleast 3x/wk & dieting.  History of Present Illness: rov HTN--amb BP 120s Anemia--still on iron  c/o wt gain since hysterectomy 12/08 pt is exercising at least 3x/wk & dieting     Updated Prior Medication List: FELODIPINE 5 MG  TB24 (FELODIPINE) one p.o. daily METOPROLOL TARTRATE 25 MG  TABS (METOPROLOL TARTRATE) one p.o. b.i.d.  Current Allergies (reviewed today): ! ASA ! * PEANUT BUTTER  Past Medical History:    Reviewed history from 10/19/2007 and no changes required:       G2 P2       Headache - developed migraines after 2nd pregnancy, had a negative CT of the head       Anemia-NOS       Hypertension  Past Surgical History:    Inguinal herniorrhaphy (12/14/2002)    Caesarean section (12/14/1996)    Caesarean section (12/14/2005)    Hysterectomy 12-08 (no oophorectomy per surgical report)       Social History:    Married   Risk Factors: Tobacco use:  never Passive smoke exposure:  no Drug use:  no Alcohol use:  no Exercise:  yes    Type:  walks every day   Review of Systems  CV      Denies chest pain or discomfort and swelling of feet.  Resp      Denies cough and shortness of breath.   Physical Exam  General:     alert and well-developed.   Neck:     no thyromegaly.   Lungs:     normal respiratory effort, no intercostal retractions, no accessory muscle use, and normal breath sounds.   Heart:     normal rate, regular rhythm, and no murmur.   Extremities:     no edema    Impression & Recommendations:  Problem # 1:  HYPERTENSION (ICD-401.9) Assessment: Unchanged  Her updated medication list for this  problem includes:    Felodipine 5 Mg Tb24 (Felodipine) ..... One p.o. daily    Metoprolol Tartrate 25 Mg Tabs (Metoprolol tartrate) ..... One p.o. b.i.d.  BP today: 120/80 Prior BP: 132/80 (10/25/2007)  Labs Reviewed: Creat: 0.7 (05/12/2007)  Orders: TLB-BMP (Basic Metabolic Panel-BMET) (80048-METABOL) Venipuncture (16010)   Problem # 2:  ANEMIA-NOS (ICD-285.9) f/u by gyn (per pt) The following medications were removed from the medication list:    Ferralet 90 90-1 Mg Tabs (Iron-folic acid-b12-c-docusate) ..... Qd   Problem # 3:  WEIGHT GAIN (ICD-783.1) c/o wt gain despite diet and exercise labs info provided other options: WW,optifast, Meridia re asses on RTC Orders: TLB-TSH (Thyroid Stimulating Hormone) (84443-TSH)   Complete Medication List: 1)  Felodipine 5 Mg Tb24 (Felodipine) .... One p.o. daily 2)  Metoprolol Tartrate 25 Mg Tabs (Metoprolol tartrate) .... One p.o. b.i.d.   Patient Instructions: 1)  Please schedule a follow-up appointment in 4 months.    ]

## 2011-01-15 NOTE — Assessment & Plan Note (Signed)
Summary: new pt  PT ALLERGIC TO ASA - THROAT CLOSES UP  Vital Signs:  Patient Profile:   40 Years Old Female Height:     59.5 inches Weight:      14.64 pounds Pulse rate:   84 / minute Pulse rhythm:   regular BP sitting:   132 / 84  (left arm) Cuff size:   regular  Vitals Entered By: [name] (May 12, 2007 10:42 AM)               Chief Complaint:  went to ob/gyn - elevated bp and fbs (139); pt fasting now..  History of Present Illness: so her gynecologist recently. They noticed her blood pressure to be 140/115 twice. They also note is her known fasting sugar to be 139.    Past Medical History:    G2 P2    Headache - developed migraines after 2nd pregnancy, had a negative CT of the head  Past Surgical History:    Inguinal herniorrhaphy (12/14/2002)    Caesarean section (12/14/1996)    Caesarean section (12/14/2005)   Family History:    Father: [employer]     Mother: DM, HTN, GLAUCOMA    Siblings: HTN, DM    CAD - MGM    STROKE - MGF       Risk Factors:  Tobacco use:  never Passive smoke exposure:  no Drug use:  no Alcohol use:  no Exercise:  yes    Type:  walks every day   Review of Systems       she has gained 10 pounds in the last few months. Her diet is to be slightly better. She is active.   CV      Denies chest pain or discomfort and swelling of feet.      her last delivery was in March 2007.  During the last two months of that pregnancy her blood pressure was slightly elevated.  Resp      Denies shortness of breath.   Physical Exam  General:     alert and well-developed.   Neck:     no thyromegaly.   Lungs:     normal respiratory effort, no intercostal retractions, and no accessory muscle use.   Heart:     normal rate and regular rhythm.   Abdomen:     soft, non-tender, no hepatomegaly, and no splenomegaly.  no bruit    Impression & Recommendations:  Problem # 1:  ELEVATED BLOOD PRESSURE WITHOUT DIAGNOSIS OF  HYPERTENSION (ICD-796.2) blood pressure today is fine.  Problem # 2:  HYPERGLYCEMIA (ICD-790.6) ?  Hyperglycemia. BMP  and A1c. Her gynecology  checked her hemoglobin and thyroid   Patient Instructions: 1)  It is important that you exercise regularly at least 30 minutes 5 times a week. If you develop chest pain, have severe difficulty breathing, or feel very tired , stop exercising immediately and seek medical attention. 2)  You need to lose weight. Consider a lower calorie diet and regular exercise.  3)  will get the blood work results soon. 4)  Come back in 3 months. 5)  low-salt diet  Laboratory Results   Blood Tests   Date/Time Recieved: May 12, 2007 10:48 AM  Glucose (fasting): 112 mg/dL   (Normal Range: 16-109)

## 2011-01-15 NOTE — Assessment & Plan Note (Signed)
Summary: personal/nta   Vital Signs:  Patient profile:   40 year old female Height:      59.5 inches Weight:      167.6 pounds BMI:     33.40 Pulse rate:   76 / minute BP sitting:   142 / 80  Vitals Entered By: [name] (December 09, 2009 12:56 PM) CC: NOT FEELING WELL Is Patient Diabetic? No Comments  - feeling "horrible" since October  - emotional  - not sleeping, when she does sleep has nightmares [name]  December 09, 2009 1:00 PM    History of Present Illness:  not feeling well since October. Very emotional, cries frequently without reason having very disturbing and "crazy"  dreams in the past she had some depression, but current  symptoms are  somehow different from her previous episodes  Current Medications (verified): 1)  Felodipine 10 Mg Xr24h-Tab (Felodipine) .Marland Kitchen.. 1 A Day 2)  Metoprolol Tartrate 25 Mg  Tabs (Metoprolol Tartrate) .... One P.o. B.i.d. 3)  Proair Hfa 108 (90 Base) Mcg/act Aers (Albuterol Sulfate) .... 2 Puffs Qid As Needed  Allergies (verified): 1)  ! Asa 2)  ! * Peanut Butter  Past History:  Past Medical History: Gyn-- Dr Cherly Hensen G2 P2 Headache - developed migraines after 2nd pregnancy, had a negative CT of the head Anemia-NOS Hypertension    Past Surgical History: Reviewed history from 02/21/2008 and no changes required. Inguinal herniorrhaphy (12/14/2002) Caesarean section (12/14/1996) Caesarean section (12/14/2005) Hysterectomy 12-08 (no oophorectomy per surgical report)  Family History: Reviewed history from 05/12/2007 and no changes required. 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 admits to probably hot  flashes: Sweaty all  night, her feet and  hand  feel cold and hot through the  night denies any problems at home or at work. Her children are doing okay. Sleep is poor, mostly because she is afraid of nightmares  Physical Exam  General:  alert and well-developed.   Neck:  no masses and no thyromegaly.   Lungs:  normal respiratory effort, no intercostal retractions, no accessory muscle use, and normal breath sounds.   Heart:  normal rate, regular rhythm, no murmur, and no gallop.   Psych:  Oriented X3, memory intact for recent and remote, normally interactive, good eye contact, not anxious appearing, and not depressed appearing.     Impression & Recommendations:  Problem # 1:  ANXIETY (ICD-300.00)  symptoms  may be due to anxiety and/or menopause as she had a hysterectomy and now having hot flashes. Labs based on results we can either prescribed HRT versus SSRI  noting  no family history of breast cancer, and  a negative mammogram two years ago ( per patient), her gynecologist told her her next mammogram is due at age 56    Orders: TLB-TSH (Thyroid Stimulating Hormone) (84443-TSH) Venipuncture (16109)  Her updated medication list for this problem includes:    Clonazepam 0.5 Mg Tbdp (Clonazepam) .Marland Kitchen... 1 or 2 by mouth at bedtime as needed insomnia  Problem # 2:  ? of MENOPAUSAL SYNDROME (ICD-627.2)  see #1  Orders: TLB-FSH (Follicle Stimulating Hormone) (83001-FSH) TLB-Luteinizing Hormone (LH) (83002-LH)  Complete Medication List: 1)  Felodipine 10 Mg Xr24h-tab (Felodipine) .Marland Kitchen.. 1 a day 2)  Metoprolol Tartrate 25 Mg Tabs (Metoprolol  tartrate) .... One p.o. b.i.d. 3)  Proair Hfa 108 (90 Base) Mcg/act Aers (Albuterol sulfate) .... 2 puffs qid as needed 4)  Clonazepam 0.5 Mg Tbdp (Clonazepam) .Marland Kitchen.. 1 or 2 by mouth at bedtime as needed insomnia  Other Orders: TLB-BMP (Basic Metabolic Panel-BMET) (80048-METABOL) TLB-CBC Platelet - w/Differential (85025-CBCD) Prescriptions: CLONAZEPAM 0.5 MG TBDP (CLONAZEPAM) 1 or 2 by mouth at bedtime as needed insomnia   #40 x 0   Entered and Authorized by:   Elita Quick E. Janise Gora MD   Signed by:   Nolon Rod. Christophr Calix MD on 12/09/2009   Method used:   Print then Give to Patient   RxID:   [id number]

## 2011-01-15 NOTE — Miscellaneous (Signed)
Summary: Orders Update   Clinical Lists Changes  Orders: Added new Referral order of Psychology Referral (Psychology) - Signed 

## 2011-01-15 NOTE — Assessment & Plan Note (Signed)
Summary: BLEEDING & PAINFUL HEMORRHOIDS/RH......   Vital Signs:  Patient profile:   40 year old female Weight:      166.4 pounds Pulse rate:   64 / minute BP sitting:   116 / 70  (left arm)  Vitals Entered By: Doristine Devoid (September 06, 2009 8:54 AM) CC: painful hemrrhoid and have started bleeding    History of Present Illness: 40 yo woman here today for bleeding hemorrhoids.  hx of hemorrhoids during pregnancy in 07.  had been using steroid cream at that time.  pain began Monday and had red blood.  has been using Tucks and Preparation H w/out relief.  today is 1st day pt able to sit comfortably.  pt reports increased bleeding yesterday but has slowed today.  pt reports increased stress recently, denies overt constipation.  also reports yeast sxs- cottage cheese d/c, no itching or burning at this time.  using monistat OTC w/out relief..      Allergies (verified): 1)  ! Asa 2)  ! * Peanut Butter  Review of Systems      See HPI  Physical Exam  General:  alert and well-developed.   Rectal:  large external hemorrhoid, very tender to touch but not thrombosed.  no current bleeding. Genitalia:  normal introitus, copious white vaginal d/c.   Impression & Recommendations:  Problem # 1:  EXTERNAL HEMORRHOIDS WITHOUT MENTION COMP (ICD-455.3) Assessment New pt's hemorrhoid tender but not thrombosed.  start nupercainal and analpram.  reviewed supportive care and red flags that should prompt return.  Pt expresses understanding and is in agreement w/ this plan.  Problem # 2:  CANDIDIASIS, VAGINAL (ICD-112.1) Assessment: New  pt w/ yeast on wet prep.  start diflucan. Her updated medication list for this problem includes:    Diflucan 150 Mg Tabs (Fluconazole) ..... Once daily.  may repeat in 3 days if no improvement.  Orders: Wet Prep (38756EP)  Complete Medication List: 1)  Felodipine 10 Mg Xr24h-tab (Felodipine) .Marland Kitchen.. 1 a day 2)  Metoprolol Tartrate 25 Mg Tabs (Metoprolol tartrate)  .... One p.o. b.i.d. 3)  Proair Hfa 108 (90 Base) Mcg/act Aers (Albuterol sulfate) .... 2 puffs qid as needed 4)  Diflucan 150 Mg Tabs (Fluconazole) .... Once daily.  may repeat in 3 days if no improvement. 5)  Nupercainal 1 % Oint (Dibucaine) .... Apply 3-4x/day as needed for pain.  disp 60 grams 6)  Analpram-hc 1-2.5 % Crea (Hydrocortisone ace-pramoxine) .... Apply 3-4 times/day as needed for pain and inflammation.  disp 30 grams  Patient Instructions: 1)  Use the Nupercainal for pain (numbing cream) 2)  Use the Analpram (steroid cream) for inflammation and to shrink the hemorrhoid 3)  Take the Diflucan as directed 4)  If you develop severe, intolerable pain- please call or go to the ER to make sure there is no clot  5)  Hang in there! Prescriptions: ANALPRAM-HC 1-2.5 % CREA (HYDROCORTISONE ACE-PRAMOXINE) apply 3-4 times/day as needed for pain and inflammation.  disp 30 grams  #30 x 1   Entered and Authorized by:   Neena Rhymes MD   Signed by:   Neena Rhymes MD on 09/06/2009   Method used:   Print then Give to Patient   RxID:   [id number] NUPERCAINAL 1 % OINT (DIBUCAINE) apply 3-4x/day as needed for pain.  disp 60 grams  #60 x 1   Entered and Authorized by:   Neena Rhymes MD   Signed by:   Neena Rhymes MD on 09/06/2009  Method used:   Print then Give to Patient   RxID:   [id number] DIFLUCAN 150 MG TABS (FLUCONAZOLE) once daily.  may repeat in 3 days if no improvement.  #2 x 0   Entered and Authorized by:   Neena Rhymes MD   Signed by:   Neena Rhymes MD on 09/06/2009   Method used:   Print then Give to Patient   RxID:   [id number]   Laboratory Results    Wet [place]/KOH WBC/hpf 5-10 Bacteria/hpf 2+ Clue cells/hpf none  Negative whiff Yeast/hpf few Trichomonas/hpf none

## 2011-01-15 NOTE — Assessment & Plan Note (Signed)
Summary: bp elevated/kdc   Vital Signs:  Patient profile:   40 year old female Weight:      158 pounds Pulse rate:   65 / minute Pulse rhythm:   regular BP sitting:   158 / 94  (left arm) Cuff size:   large  Vitals Entered By: Army Fossa CMA (March 28, 2010 3:07 PM) CC: Pt states her BP has been elevated for 1 week- highest was 180/117.   History of Present Illness: Pt here c/o increased bp since Monday.   + headache and occassional heart fluttering but other wise no complaints.   The highest it has been is 180/117.  Allergies: 1)  ! Asa 2)  ! * Peanut Butter  Physical Exam  General:  Well-developed,well-nourished,in no acute distress; alert,appropriate and cooperative throughout examination Head:  Normocephalic and atraumatic without obvious abnormalities. No apparent alopecia or balding. Eyes:  pupils equal, pupils round, pupils reactive to light, and no injection.   Lungs:  Normal respiratory effort, chest expands symmetrically. Lungs are clear to auscultation, no crackles or wheezes. Heart:  Normal rate and regular rhythm. S1 and S2 normal without gallop, murmur, click, rub or other extra sounds. Extremities:  No clubbing, cyanosis, edema, or deformity noted with normal full range of motion of all joints.   Neurologic:  alert & oriented X3, cranial nerves II-XII intact, strength normal in all extremities, and gait normal.   Psych:  Oriented X3 and normally interactive.     Impression & Recommendations:  Problem # 1:  HYPERTENSION (ICD-401.9)  Her updated medication list for this problem includes:    Felodipine 10 Mg Xr24h-tab (Felodipine) .Marland Kitchen... 1 a day    Bystolic 5 Mg Tabs (Nebivolol hcl) .Marland Kitchen... 1 by mouth once daily  Orders: EKG w/ Interpretation (93000)  BP today: 158/94 Prior BP: 120/80 (01/01/2010)  Labs Reviewed: K+: 3.9 (12/09/2009) Creat: : 0.7 (12/09/2009)     Complete Medication List: 1)  Felodipine 10 Mg Xr24h-tab (Felodipine) .Marland Kitchen.. 1 a day 2)   Bystolic 5 Mg Tabs (Nebivolol hcl) .Marland Kitchen.. 1 by mouth once daily 3)  Proair Hfa 108 (90 Base) Mcg/act Aers (Albuterol sulfate) .... 2 puffs qid as needed 4)  Citalopram Hydrobromide 20 Mg Tabs (Citalopram hydrobromide) .Marland Kitchen.. 1 by mouth once daily  Patient Instructions: 1)  RTO 2-3 weeks 2)  Limit your Sodium(salt) .    EKG  Procedure date:  03/28/2010  Findings:      Sinus bradycardia with rate of:  57 bpm

## 2011-01-15 NOTE — Assessment & Plan Note (Signed)
Summary: 2 WEEK FU/KDC   Vital Signs:  Patient profile:   40 year old female Height:      59.5 inches Pulse rate:   70 / minute Pulse rhythm:   regular BP sitting:   130 / 70  (left arm)  Vitals Entered By: [name] (May 24, 2009 2:53 PM) CC: BP CHECK Comments  - advised pt to continue meds  - check bp @ home  - call if HA or elevated bp  - f/u as planned [facility]  May 24, 2009 2:58 PM    Allergies: 1)  ! Asa 2)  ! * Peanut Butter   Complete Medication List: 1)  Felodipine 10 Mg Xr24h-tab (Felodipine) .Marland Kitchen.. 1 a day 2)  Metoprolol Tartrate 25 Mg Tabs (Metoprolol tartrate) .... One p.o. b.i.d. 3)  Proair Hfa 108 (90 Base) Mcg/act Aers (Albuterol sulfate) .... 2 puffs qid as needed  Other Orders: No Charge Patient Arrived (NCPA0) (NCPA0)

## 2011-01-15 NOTE — Assessment & Plan Note (Signed)
Summary: HOSPITAL FOLLOW UP/ALJ   Vital Signs:  Patient Profile:   40 Years Old Female Height:     59.5 inches Weight:      145 pounds O2 Sat:      99 % Temp:     98.1 degrees F oral Pulse rate:   80 / minute Pulse rhythm:   regular BP sitting:   180 / 110  (left arm) Cuff size:   regular  Vitals Entered By: [name] (October 19, 2007 3:51 PM) Oxygen therapy Room Air             Comments 138/91---175/109 ..................................................................Marland KitchenShary Decamp  October 19, 2007 3:56 PM      Chief Complaint:  HOSP F/U - SEVERE ANEMIA - PT HAS BEEN TOLD SHE NEEDS HYSTERECTOMY  BUT BP HAS BEEN ELEVATED.  History of Present Illness: the patient is having severe vaginal bleeding and he is in need of a hysterectomy   she also has developed cough for two months, lower abdominal pain, severe anemia and has been in the hospital multiple times recently her gynecologist is treating her with birth control pills and iron her blood pressure has noted to be elevated in the 160's to 170s/ 90 to 100  while she is an is was in the hospital she has not been started on medication, reason?  extensive medical records were reviewed her hemoglobin was 6.3 on October 24 BMP and LFTs were normal, PT and PTT were normal she was discharged the next day after two PRBCs with a hemoglobin of 8.4 she was seen by surgery on November2 in reference to abdominal pain.  Surgery felt that the pain was not  related to a hernia that they found.  Current Allergies (reviewed today): ! ASA ! * PEANUT BUTTER Updated/Current Medications (including changes made in today's visit):  FERRALET 90 90-1 MG  TABS (IRON-FOLIC ACID-B12-C-DOCUSATE) qd NECON 0.5/35 (28) 0.5-35 MG-MCG  TABS (NORETHINDRONE-ETH ESTRADIOL)  PERCOCET 5-325 MG  TABS (OXYCODONE-ACETAMINOPHEN) 2 every 6 hours prn REGLAN 10 MG  TABS (METOCLOPRAMIDE HCL) q4h KETOROLAC TROMETHAMINE 10 MG  TABS (KETOROLAC TROMETHAMINE) q  6h * IBUPROFEN  FELODIPINE 5 MG  TB24 (FELODIPINE) one p.o. daily METOPROLOL TARTRATE 25 MG  TABS (METOPROLOL TARTRATE) one p.o. b.i.d.   Past Medical History:    G2 P2    Headache - developed migraines after 2nd pregnancy, had a negative CT of the head    Anemia-NOS    Hypertension     Review of Systems       denies fever at this time she continue to cough no  vomiting. as long as she takes Reglan for her nausea she's okay. No heartburn or epigastric abdominal pain she continue with vaginal bleeding, the abdominal pain is well-controlled with anti-inflammatories.    Physical Exam  General:     alert and well-developed.   Eyes:     slightly pale Neck:     no thyromegaly.   Lungs:     normal respiratory effort, no intercostal retractions, and no accessory muscle use.   Heart:     normal rate, regular rhythm, and no murmur.   Abdomen:     soft, non-tender, no hepatomegaly, and no splenomegaly.   Extremities:     no edema    Impression & Recommendations:  Problem # 1:  ANEMIA-NOS (ICD-285.9) on November 2 her hemoglobin was 8.0 continue with present care Her updated medication list for this problem includes:    Ferralet 90  90-1 Mg Tabs (Iron-folic acid-b12-c-docusate) ..... Qd Hgb: 8.4 (10/19/2007)     Problem # 2:  cough and recent fever on chart review she has not had a chest x-ray lately chest x-ray she also have sinus congestion, sinusitis? REFUSED CXR  Problem # 3:  HYPERTENSION (ICD-401.9)  start felodipine and metoprolol recent BMP of the hospital was normal Her updated medication list for this problem includes:    Felodipine 5 Mg Tb24 (Felodipine) ..... One p.o. daily    Metoprolol Tartrate 25 Mg Tabs (Metoprolol tartrate) ..... One p.o. b.i.d.   Problem # 4:  office visit in one week  Problem # 5:  face-to-face 25 minutes due to extensive chart review  Complete Medication List: 1)  Ferralet 90 90-1 Mg Tabs (Iron-folic acid-b12-c-docusate)  .... Qd 2)  Necon 0.5/35 (28) 0.5-35 Mg-mcg Tabs (Norethindrone-eth estradiol) 3)  Percocet 5-325 Mg Tabs (Oxycodone-acetaminophen) .... 2 every 6 hours prn 4)  Reglan 10 Mg Tabs (Metoclopramide hcl) .... Q4h 5)  Ketorolac Tromethamine 10 Mg Tabs (Ketorolac tromethamine) .... Q 6h 6)  Ibuprofen  7)  Felodipine 5 Mg Tb24 (Felodipine) .... One p.o. daily 8)  Metoprolol Tartrate 25 Mg Tabs (Metoprolol tartrate) .... One p.o. b.i.d.  Other Orders: Hgb ([id number])   Patient Instructions: 1)  continue all the  medications as before 2)  start the two new medications for blood pressure 3)  chest x-ray 4)  come back in one week. 5)  If you have severe fever or you feel worse let us know    Prescriptions: METOPROLOL TARTRATE 25 MG  TABS (METOPROLOL TARTRATE) one p.o. b.i.d.  #60 x one   Entered and Authorized by:   Eymi Lipuma E. Ephriam Turman MD   Signed by:   Nolon Rod. Stpehanie Montroy MD on 10/19/2007   Method used:   Print then Give to Patient   RxID:   [id number] FELODIPINE 5 MG  TB24 (FELODIPINE) one p.o. daily  #30 x one   Entered and Authorized by:   Elita Quick E. Josemanuel Eakins MD   Signed by:   Nolon Rod. Tianni Escamilla MD on 10/19/2007   Method used:   Print then Give to Patient   RxID:   [id number]  ]  Laboratory Results  CBC HGB:  8.4 g/dL   (Normal Range: 01.0-27.2 in Males, 12.0-15.0 in Females)

## 2011-01-20 ENCOUNTER — Encounter: Payer: Self-pay | Admitting: Internal Medicine

## 2011-01-20 ENCOUNTER — Ambulatory Visit (INDEPENDENT_AMBULATORY_CARE_PROVIDER_SITE_OTHER): Payer: Managed Care, Other (non HMO) | Admitting: Internal Medicine

## 2011-01-20 DIAGNOSIS — R05 Cough: Secondary | ICD-10-CM

## 2011-01-20 DIAGNOSIS — R059 Cough, unspecified: Secondary | ICD-10-CM

## 2011-01-29 NOTE — Assessment & Plan Note (Signed)
Summary: COUGH/RH.....   Vital Signs:  Patient profile:   40 year old female Height:      59.5 inches Weight:      158.38 pounds BMI:     31.57 Temp:     98.6 degrees F oral Pulse rate:   86 / minute Pulse rhythm:   regular BP sitting:   126 / 84  (left arm) Cuff size:   large  Vitals Entered By: Army Fossa CMA (January 20, 2011 10:56 AM) CC: Cough x 2 weeks  Comments tried OTC meds [facility] [address]    History of Present Illness: developed a dry cough 2 weeks ago This is associated with some chest tightness Cough comes in spells and they're hard to control, over-the-counter medications not helping. She has used her inhaler from time to time her baby had a respiratory illness 2 weeks ago  Current Medications (verified): 1)  Felodipine 10 Mg Xr24h-Tab (Felodipine) .Marland Kitchen.. 1 A Day 2)  Bystolic 10 Mg Tabs (Nebivolol Hcl) .Marland Kitchen.. 1 A Day 3)  Proair Hfa 108 (90 Base) Mcg/act Aers (Albuterol Sulfate) .... 2 Puffs Qid As Needed  Allergies (verified): 1)  ! Asa 2)  ! * Peanut Butter  Past History:  Past Medical History: Reviewed history from 12/09/2009 and no changes required. Gyn-- Dr Cherly Hensen G2 P2 Headache - developed migraines after 2nd pregnancy, had a negative CT of the head Anemia-NOS Hypertension    Past Surgical History: Reviewed history from 02/21/2008 and no changes required. Inguinal herniorrhaphy (12/14/2002) Caesarean section (12/14/1996) Caesarean section (12/14/2005) Hysterectomy 12-08 (no oophorectomy per surgical report)  Review of Systems General:  Denies chills and fever. Resp:  Denies sputum productive and wheezing. GI:  Denies nausea and vomiting. MS:  Denies muscle aches.  Physical Exam  General:  alert and well-developed.  no apparent distress Ears:  R ear normal and L ear normal.   Mouth:  no redness or discharge Lungs:  normal respiratory effort, no intercostal retractions, no accessory muscle use, and normal breath sounds.   Heart:   normal rate, regular rhythm, and no murmur.     Impression & Recommendations:  Problem # 1:  COUGH (ICD-786.2) likely from a URI/bronchitis. She has a history of bronchospasm, on exam today she is not  wheezing. see instructions  Complete Medication List: 1)  Felodipine 10 Mg Xr24h-tab (Felodipine) .Marland Kitchen.. 1 a day 2)  Bystolic 10 Mg Tabs (Nebivolol hcl) .Marland Kitchen.. 1 a day 3)  Proair Hfa 108 (90 Base) Mcg/act Aers (Albuterol sulfate) .... 2 puffs qid as needed 4)  Hydrocodone-homatropine 5-1.5 Mg Tabs (Hydrocodone-homatropine) .Marland Kitchen.. 1 tsp three times a day as needed for cough, will cause drowsiness 5)  Zithromax Z-pak 250 Mg Tabs (Azithromycin) .... As directed  Patient Instructions: 1)  rest, fluids 2)  mucinex DM OTC two times a day x 1 week, then as needed 3)  if the cough persist, use albuterol or hydrocodone syrup 4)  if no better in few days, start a Zpack (antibiotic) 5)  call if no better in 2 weeks 6)    Prescriptions: ZITHROMAX Z-PAK 250 MG TABS (AZITHROMYCIN) as directed  #1 x 0   Entered and Authorized by:   Nolon Rod. Kameela Leipold MD   Signed by:   Nolon Rod. Malashia Kamaka MD on 01/20/2011   Method used:   Print then Give to Patient   RxID:   [id number] HYDROCODONE-HOMATROPINE 5-1.5 MG TABS (HYDROCODONE-HOMATROPINE) 1 tsp three times a day as needed for cough, will cause drowsiness  #  150cc x 0   Entered and Authorized by:   Nolon Rod. Uldine Fuster MD   Signed by:   Nolon Rod. Jerri Hargadon MD on 01/20/2011   Method used:   Print then Give to Patient   RxID:   [id number]    Orders Added: 1)  Est. Patient Level III [56213]

## 2011-02-25 LAB — URINALYSIS, ROUTINE W REFLEX MICROSCOPIC
Bilirubin Urine: NEGATIVE
Glucose, UA: NEGATIVE mg/dL
Hgb urine dipstick: NEGATIVE
Ketones, ur: NEGATIVE mg/dL
Nitrite: NEGATIVE
Protein, ur: NEGATIVE mg/dL
Specific Gravity, Urine: 1.02 (ref 1.005–1.030)
Urobilinogen, UA: 0.2 mg/dL (ref 0.0–1.0)
pH: 7 (ref 5.0–8.0)

## 2011-02-25 LAB — CBC
HCT: 40.3 % (ref 36.0–46.0)
Hemoglobin: 13.2 g/dL (ref 12.0–15.0)
MCH: 30.6 pg (ref 26.0–34.0)
MCHC: 32.9 g/dL (ref 30.0–36.0)
MCV: 93 fL (ref 78.0–100.0)
Platelets: 191 10*3/uL (ref 150–400)
RBC: 4.33 MIL/uL (ref 3.87–5.11)
RDW: 13 % (ref 11.5–15.5)
WBC: 5.4 10*3/uL (ref 4.0–10.5)

## 2011-02-25 LAB — BASIC METABOLIC PANEL
BUN: 10 mg/dL (ref 6–23)
CO2: 30 mEq/L (ref 19–32)
Calcium: 9.4 mg/dL (ref 8.4–10.5)
Chloride: 103 mEq/L (ref 96–112)
Creatinine, Ser: 0.7 mg/dL (ref 0.4–1.2)
GFR calc Af Amer: >60 mL/min (ref >60)
GFR calc non Af Amer: >60 mL/min (ref >60)
Glucose, Bld: 113 mg/dL — ABNORMAL HIGH (ref 70–99)
Potassium: 4.1 mEq/L (ref 3.5–5.1)
Sodium: 142 mEq/L (ref 135–145)

## 2011-02-25 LAB — DIFFERENTIAL
Basophils Absolute: 0 10*3/uL (ref 0.0–0.1)
Basophils Relative: 1 % (ref 0–1)
Eosinophils Absolute: 0.1 10*3/uL (ref 0.0–0.7)
Eosinophils Relative: 1 % (ref 0–5)
Lymphocytes Relative: 21 % (ref 12–46)
Lymphs Abs: 1.1 10*3/uL (ref 0.7–4.0)
Monocytes Absolute: 0.4 10*3/uL (ref 0.1–1.0)
Monocytes Relative: 7 % (ref 3–12)
Neutro Abs: 3.8 10*3/uL (ref 1.7–7.7)
Neutrophils Relative %: 71 % (ref 43–77)

## 2011-02-25 LAB — GLUCOSE, CAPILLARY: Glucose-Capillary: 124 mg/dL — ABNORMAL HIGH (ref 70–99)

## 2011-02-26 ENCOUNTER — Encounter: Payer: Self-pay | Admitting: Family Medicine

## 2011-02-26 ENCOUNTER — Ambulatory Visit (INDEPENDENT_AMBULATORY_CARE_PROVIDER_SITE_OTHER): Payer: Managed Care, Other (non HMO) | Admitting: Family Medicine

## 2011-02-26 DIAGNOSIS — E663 Overweight: Secondary | ICD-10-CM

## 2011-02-26 DIAGNOSIS — R079 Chest pain, unspecified: Secondary | ICD-10-CM

## 2011-02-26 DIAGNOSIS — I10 Essential (primary) hypertension: Secondary | ICD-10-CM

## 2011-03-03 NOTE — Assessment & Plan Note (Signed)
Summary: would like to discuss phenteramine/cdj   Vital Signs:  Patient profile:   40 year old female Weight:      161.8 pounds BMI:     32.25 Pulse rate:   65 / minute Pulse rhythm:   regular BP sitting:   130 / 90  (right arm) Cuff size:   large  Vitals Entered By: Almeta Monas CMA Duncan Dull) (February 26, 2011 2:10 PM) CC: wants to start Phentermine   History of Present Illness: Pt here to discuss weight loss.  Pt struggles with diet and exercise and is requesting phenteramine.    Problems Prior to Update: 1)  Overweight  (ICD-278.02) 2)  Chest Pain Unspecified  (ICD-786.50) 3)  ? of Menopausal Syndrome  (ICD-627.2) 4)  Anxiety  (ICD-300.00) 5)  Weight Gain  (ICD-783.1) 6)  Hypertension  (ICD-401.9) 7)  Anemia-nos  (ICD-285.9) 8)  Hyperglycemia  (ICD-790.6) 9)  Headache  (ICD-784.0)  Medications Prior to Update: 1)  Felodipine 10 Mg Xr24h-Tab (Felodipine) .Marland Kitchen.. 1 A Day 2)  Bystolic 10 Mg Tabs (Nebivolol Hcl) .Marland Kitchen.. 1 A Day 3)  Proair Hfa 108 (90 Base) Mcg/act Aers (Albuterol Sulfate) .... 2 Puffs Qid As Needed 4)  Hydrocodone-Homatropine 5-1.5 Mg Tabs (Hydrocodone-Homatropine) .Marland Kitchen.. 1 Tsp Three Times A Day As Needed For Cough, Will Cause Drowsiness 5)  Zithromax Z-Pak 250 Mg Tabs (Azithromycin) .... As Directed  Current Medications (verified): 1)  Felodipine 10 Mg Xr24h-Tab (Felodipine) .Marland Kitchen.. 1 A Day 2)  Bystolic 10 Mg Tabs (Nebivolol Hcl) .Marland Kitchen.. 1 A Day 3)  Proair Hfa 108 (90 Base) Mcg/act Aers (Albuterol Sulfate) .... 2 Puffs Qid As Needed  Allergies (verified): 1)  ! Asa 2)  ! * Peanut Butter  Past History:  Past medical, surgical, family and social histories (including risk factors) reviewed for relevance to current acute and chronic problems.  Past Medical History: Reviewed history from 12/09/2009 and no changes required. Gyn-- Dr Cherly Hensen G2 P2 Headache - developed migraines after 2nd pregnancy, had a negative CT of the head Anemia-NOS Hypertension    Past  Surgical History: Reviewed history from 02/21/2008 and no changes required. Inguinal herniorrhaphy (12/14/2002) Caesarean section (12/14/1996) Caesarean section (12/14/2005) Hysterectomy 12-08 (no oophorectomy per surgical report)  Family History: Reviewed history from 12/09/2009 and no changes required. Father: [employer]  Mother: DM, HTN, GLAUCOMA Siblings: HTN, DM CAD - MGM STROKE - MGF colon ca--no breast ca--no  Social History: Reviewed history from 12/09/2009 and no changes required. Married 2kids occupation-- Scientist, physiological tobacco--no ETOH--no  Review of Systems      See HPI  Physical Exam  General:  Well-developed,well-nourished,in no acute distress; alert,appropriate and cooperative throughout examination Neck:  No deformities, masses, or tenderness noted. Lungs:  Normal respiratory effort, chest expands symmetrically. Lungs are clear to auscultation, no crackles or wheezes. Heart:  normal rate and + murmur Abdomen:  Bowel sounds positive,abdomen soft and non-tender without masses, organomegaly or hernias noted. Pulses:  R and L carotid,radial,femoral,dorsalis pedis and posterior tibial pulses are full and equal bilaterally Cervical Nodes:  No lymphadenopathy noted Axillary Nodes:  No palpable lymphadenopathy Psych:  Oriented X3, normally interactive, good eye contact, not anxious appearing, and not depressed appearing.     Impression & Recommendations:  Problem # 1:  OVERWEIGHT (ICD-278.02)  Ht: 59.5 (01/20/2011)   Wt: 161.8 (02/26/2011)   BMI: 32.25 (02/26/2011)  Orders: Nutrition Referral (Nutrition) EKG w/ Interpretation (93000)  Problem # 2:  HYPERTENSION (ICD-401.9)  Her updated medication list for this problem includes:  Felodipine 10 Mg Xr24h-tab (Felodipine) .Marland Kitchen... 1 a day    Bystolic 10 Mg Tabs (Nebivolol hcl) .Marland Kitchen... 1 a day  BP today: 130/90 Prior BP: 126/84 (01/20/2011)  Labs Reviewed: K+: 3.9 (12/09/2009) Creat: : 0.7 (12/09/2009)       Orders: Echo Referral (Echo) Nutrition Referral (Nutrition) EKG w/ Interpretation (93000)  Complete Medication List: 1)  Felodipine 10 Mg Xr24h-tab (Felodipine) .Marland Kitchen.. 1 a day 2)  Bystolic 10 Mg Tabs (Nebivolol hcl) .Marland Kitchen.. 1 a day 3)  Proair Hfa 108 (90 Base) Mcg/act Aers (Albuterol sulfate) .... 2 puffs qid as needed   Orders Added: 1)  Echo Referral [Echo] 2)  Nutrition Referral [Nutrition] 3)  Est. Patient Level IV [19147] 4)  EKG w/ Interpretation [93000]

## 2011-03-10 ENCOUNTER — Ambulatory Visit (HOSPITAL_COMMUNITY): Payer: Managed Care, Other (non HMO) | Attending: Family Medicine | Admitting: Radiology

## 2011-03-10 ENCOUNTER — Other Ambulatory Visit (HOSPITAL_COMMUNITY): Payer: Self-pay | Admitting: Radiology

## 2011-03-10 DIAGNOSIS — R9431 Abnormal electrocardiogram [ECG] [EKG]: Secondary | ICD-10-CM

## 2011-03-10 DIAGNOSIS — I10 Essential (primary) hypertension: Secondary | ICD-10-CM | POA: Insufficient documentation

## 2011-03-10 DIAGNOSIS — R011 Cardiac murmur, unspecified: Secondary | ICD-10-CM

## 2011-03-22 LAB — BASIC METABOLIC PANEL
BUN: 11 mg/dL (ref 6–23)
CO2: 28 mEq/L (ref 19–32)
Calcium: 9 mg/dL (ref 8.4–10.5)
Chloride: 103 mEq/L (ref 96–112)
Creatinine, Ser: 0.9 mg/dL (ref 0.4–1.2)
GFR calc Af Amer: >60 mL/min (ref >60)
GFR calc non Af Amer: >60 mL/min (ref >60)
Glucose, Bld: 92 mg/dL (ref 70–99)
Potassium: 3.5 mEq/L (ref 3.5–5.1)
Sodium: 141 mEq/L (ref 135–145)

## 2011-03-22 LAB — CBC
HCT: 39.2 % (ref 36.0–46.0)
Hemoglobin: 13 g/dL (ref 12.0–15.0)
MCHC: 33.1 g/dL (ref 30.0–36.0)
MCV: 91.8 fL (ref 78.0–100.0)
Platelets: 185 10*3/uL (ref 150–400)
RBC: 4.27 MIL/uL (ref 3.87–5.11)
RDW: 13.3 % (ref 11.5–15.5)
WBC: 13 10*3/uL — ABNORMAL HIGH (ref 4.0–10.5)

## 2011-03-22 LAB — DIFFERENTIAL
Basophils Absolute: 0.2 10*3/uL — ABNORMAL HIGH (ref 0.0–0.1)
Basophils Relative: 1 % (ref 0–1)
Eosinophils Absolute: 0 10*3/uL (ref 0.0–0.7)
Eosinophils Relative: 0 % (ref 0–5)
Lymphocytes Relative: 26 % (ref 12–46)
Lymphs Abs: 3.3 10*3/uL (ref 0.7–4.0)
Monocytes Absolute: 1 10*3/uL (ref 0.1–1.0)
Monocytes Relative: 8 % (ref 3–12)
Neutro Abs: 8.3 10*3/uL — ABNORMAL HIGH (ref 1.7–7.7)
Neutrophils Relative %: 65 % (ref 43–77)

## 2011-03-22 LAB — POCT CARDIAC MARKERS
CKMB, poc: <1 ng/mL — ABNORMAL LOW (ref 1.0–8.0)
Myoglobin, poc: 55.9 ng/mL (ref 12–200)
Troponin i, poc: <0.05 ng/mL (ref 0.00–0.09)

## 2011-04-28 NOTE — Consult Note (Signed)
NAME:  Abigail, Campos NO.:  [id number]   MEDICAL RECORD NO.:  [id number]          PATIENT TYPE:  INP   LOCATION:  9304                          FACILITY:  [facility]   PHYSICIAN:  Adolph Pollack, M.D.DATE OF BIRTH:  25-Mar-1971   DATE OF CONSULTATION:  DATE OF DISCHARGE:  10/16/2007                                 CONSULTATION   REQUESTING PHYSICIAN:  Physician requesting this consultation as Dr.  Ernestina Penna at OB/GYN.   REASON FOR CONSULTATION:  Lower abdominal pain.   HISTORY OF PRESENT ILLNESS:  [name] is a 40 year old female who  has had about a week and a half history of intermittent lower abdominal  pains with heavy vaginal bleeding.  She has been seen twice in the  emergency department on October 07, 2007 and October 12, 2007.  She is  noted to be significantly anemic both times and had transfusions.  On  October 12, 2007 she had a CT scan which demonstrated a small amount of  free fluid and a small supraumbilical hernia containing only fat.  She  had severe pain in the lower abdomen that started yesterday. And she  took some Percocet and vomited, but the pain persisted.  She continued  to have some moderate vaginal bleeding at times.  She has been seen by  Dr. Cherly Hensen, and there is some talk about her having a hysterectomy.  Because of her recurrent lower abdominal pain and the hernia I was asked  to see her to see if the hernia was the potential etiology for pain.  She states she has known about the hernia for some time and that it has  been easily reducible.  She had a hernia repair with mesh in 2004 by Dr.  Johna Sheriff of her primary epigastric hernia down at [facility].   PAST MEDICAL HISTORY:  1. Iron-deficiency anemia.  2. Some hypertension.  3. Diabetes.  4. Migraine headaches.   PAST SURGICAL HISTORY:  Previous operations include two cesarean  sections and an epigastric hernia repair.   ALLERGIES:  DRUG ALLERGIES INCLUDE  ASPIRIN.   CURRENT MEDICATIONS:  Here, Stadol, Colace, Toradol, Reglan.   SOCIAL HISTORY:  She is married, and she has two children.   REVIEW OF SYSTEMS:  CONSTITUTIONAL:  No fever or chills.  GI:  No  diarrhea or constipation, or blood in her stool.  GU:  No dysuria.   PHYSICAL EXAM:  GENERAL APPEARANCE:  A very pleasant female in no acute  distress.  VITAL SIGNS:  Temperature is 98.1, blood pressure is 146/96, pulse 70.  ABDOMEN: Her abdomen is soft, nontender, nondistended.  There is an  epigastric scar going down disease due to the supraumbilical area with  her easily reducible hernia.  No palpable masses are present.  BACK:  No CVA tenderness present.   LABORATORY DATA:  Demonstrates a white count 11,400 yesterday,  hemoglobin 8.9.  a CT from 10/12/2007 demonstrates a small amount of  free fluid and supraumbilical hernia containing fat but no acute intra-  abdominal inflammatory process.   IMPRESSION:  Lower abdominal pain and vaginal bleeding - I  do not think  any of her pain is coming from her reducible hernia.  I also do not  think it is coming from any type of gastrointestinal process.  Most  likely it is gynecological in origin.   RECOMMENDATIONS:  If she were to have a hysterectomy I would discuss  with Dr. Johna Sheriff potential repair of the current hernia, although it is  asymptomatic at this time.      Adolph Pollack, M.D.  Electronically Signed     TJR/MEDQ  D:  10/16/2007  T:  10/17/2007  Job:  [id number]   cc:   Lendon Colonel, MD

## 2011-04-28 NOTE — Discharge Summary (Signed)
NAME:  Abigail Campos, Abigail Campos NO.:  [id number]   MEDICAL RECORD NO.:  [id number]          PATIENT TYPE:  INP   LOCATION:  9305                          FACILITY:  [facility]   PHYSICIAN:  Maxie Better, M.D.DATE OF BIRTH:  [birth year]   DATE OF ADMISSION:  12/02/2007  DATE OF DISCHARGE:  12/04/2007                               DISCHARGE SUMMARY   ADMISSION DIAGNOSIS:  Menorrhagia, iron deficiency anemia.   DISCHARGE DIAGNOSIS:  Menorrhagia, severe pelvic adhesions, iron  deficiency anemia.   PROCEDURE:  Laparoscopic assisted vaginal hysterectomy, extensive  adhesiolysis.   HOSPITAL COURSE:  The patient was admitted to [facility].  She  underwent a laparoscopic assisted vaginal hysterectomy with extensive  lysis of adhesions.  Please see the dictated operative report.  Her CBC  on postop day #1 showed a hemoglobin 8.6, hematocrit 26.6, white count  12.2, platelets 185,000.  She had normal tubes and ovaries with an  intact bladder noted at the time of surgery.  The patient's  postoperative course was notable for slow bowel function.  She was  asymptomatic from her anemia.  By postop day #3 the patient had passed  flatus with the assistance of Dulcolax.  She felt better.  She remained  afebrile throughout her course.  The pathology on the specimen not  available at the time of this dictation.  The patient, however, by  postop day #2 was deemed well to be discharged home.   DISPOSITION:  Home.   CONDITION:  Was stable.   DISCHARGE MEDICATIONS:  1. Tylox 1-2 every 3-4 hours p.r.n. pain.  2. Iron supplementation one p.o. b.i.d.  3. Motrin 800 mg every 6-8 hours p.r.n. pain.   FOLLOWUP:  Followup appointment at [facility] in 6 weeks.   DISCHARGE INSTRUCTIONS:  Call for temperature greater than or equal to  100.4.  Nothing per vagina for 4-6 weeks, no heavy lifting or driving  for 2 weeks.  Call for severe abdominal pain, nausea, vomiting, soaking  a regular  pad more frequently, no straining with a bowel movement.      Maxie Better, M.D.  Electronically Signed     [place]/MEDQ  D:  01/08/2008  T:  01/08/2008  Job:  [id number]

## 2011-04-28 NOTE — Discharge Summary (Signed)
NAME:  Abigail Campos, Abigail Campos NO.:  [id number]   MEDICAL RECORD NO.:  [id number]          PATIENT TYPE:  OBV   LOCATION:  1335                         [facility]ACILITY[facility]  [facility]   PHYSICIAN:  Raenette Rover. Felicity Coyer, MDDATE OF BIRTH:  25-May-1971   DATE OF ADMISSION:  10/07/2007  DATE OF DISCHARGE:  10/08/2007                               DISCHARGE SUMMARY   DISCHARGE DIAGNOSES:  1. Severe iron deficiency anemia, status post 2 units transfusion with      improvement, discharge hemoglobin 8.4.  2. Ongoing menorrhagia.  No evidence of abnormality on abdominal      ultrasound, outpatient follow-up with GYN as previously scheduled.      Continue her oral contraceptive pills.  3. Questionable history of borderline hypertension, not currently on      treatment, discharge blood pressure 135/90.  4. Chronic blood loss anemia secondary to #2.  See details below.   DISCHARGE MEDICATIONS:  Include:  1. Oral contraceptives one daily or as per Dr. cousins instructions,      from prior to admission.  2. Slow Fe iron one t.i.d. with food.  3. Ibuprofen 600 mg q.4 to 6 h. p.r.n. cramping pain.   HOSPITAL FOLLOWUP:  As scheduled:  1. With GYN Dr. Cherly Hensen for Monday a.m. October 27, as she has been      instructed previously.  2. Also follow up with primary care physician, Dr. Willow Ora, as needed      for routine health maintenance.   CONDITION ON DISCHARGE:  Medically stable.   DISPOSITION:  Discharge home with menstrual bleeding, much improved.   HOSPITAL COURSE BY PROBLEM:  1. Severe iron deficiency anemia with menorrhagia.  The patient is a      40 year old black woman with history of heavy ongoing periods since      age 47, as well as chronic anemia dating back by lab review for at      least the last 18 months, presents to the emergency room day of      admission due to an increase in the amount of bleeding and cramping      pain with her most recent menstrual cycle, which began 2 days  prior      to presentation.  She had called her primary GYN physician for      instructions, and Dr. Seymour Bars, on call, prescribed for her an oral      contraceptive pill for which she was to take 3 tablets, now with      one every 6 hours to control bleeding and see the office in the      morning.  However, due to continued pain, she came instead to the      emergency room for evaluation.  She was found to have a hemoglobin      of 6.3 and was thus referred for admission for transfusion, as well      as control of bleeding.  Given her history of menorrhagia, it was      likely iron deficient due to sub-acute monthly heavy bleeding, and      this  was confirmed with a ferritin level of one and iron level of      29; however, also given her history of chronic anemia with severe      microcytic disease, I have also sent a hemoglobin electrophoresis      to rule out thalassemia or sickle cell trait, because she says      there is also a family history of anemia, but she has never      previously required transfusion.  After an overnight observation,      continued with the transfusion, her hemoglobin was subsequently      improved as expected to a level of 8.4.  Her bleeding has slowed      with the administration of Premarin 25 mg IV x1, as well as Provera      given on the day of admission and on the day of discharge.  I have      instructed the patient that she may continue taking her oral      contraceptive pills as previously prescribed by gynecology, and to      follow-up with them on Monday as previously instructed.  She will      call her on-call physician gynecology for further instructions or      problems.  In the meanwhile, between discharge and Monday morning,      and is otherwise felt stable for discharge home  2. Viral gastroenteritis.  Prior to admission, the patient complained      of fever with nausea/vomiting, but none was seen during the time of      this hospitalization.   She has been afebrile, tolerating p.o., and      this appears to have been a resolved issue, even prior to      admission.  No evidence of LFT abnormalities, with a normal lipase      and a normal abdominal ultrasound done in the emergency room prior      to admission.  3. History of borderline hypertension.  On presentation to the ER, her      blood pressure was elevated a with systolic pressure in the 160s.      However, during the hospitalization of observation, her pressure      has run systolic in the 130s.  She is not currently on      antihypertensives, and the initial pressure may have been      exacerbated by pain.  Outpatient follow-up with primary MD, on      regards to this borderline blood pressure.      Valerie A. Felicity Coyer, MD  Electronically Signed     VAL/MEDQ  D:  10/08/2007  T:  10/09/2007  Job:  [id number]

## 2011-04-28 NOTE — Op Note (Signed)
NAME:  Abigail Campos, Abigail Campos NO.:  [id number]   MEDICAL RECORD NO.:  [id number]          PATIENT TYPE:  INP   LOCATION:  9305                          FACILITY:  [facility]   PHYSICIAN:  Maxie Better, M.D.DATE OF BIRTH:  Oct 22, 1971   DATE OF PROCEDURE:  12/02/2007  DATE OF DISCHARGE:                               OPERATIVE REPORT   PREOPERATIVE DIAGNOSES:  1. Menorrhagia.  2. Severe iron deficiency anemia.   PROCEDURE:  Laparoscopic-assisted vaginal hysterectomy, extensive  adhesiolysis.   POSTOPERATIVE DIAGNOSES:  1. Menorrhagia.  2. Severe iron deficiency anemia.  3. Severe pelvic adhesions.   ANESTHESIA:  General.   SURGEON:  Maxie Better, M.D.   ASSISTANT:  Cordelia Pen A. Rosalio Macadamia, M.D.   INDICATIONS:  This is a 40 year old para 2 married black female with  previous cesarean section x2 who has had severe iron deficiency anemia  secondary to menorrhagia of unknown etiology.  The patient has received  several blood transfusions.  She now presents for definitive surgical  management, having failed hormonal therapy as well.  The patient was  noted to have pelvic adhesions at the time of her last cesarean section.  Therefore, a bowel prep was initiated, and the patient was scheduled for  laparoscopic-assisted vaginal hysterectomy with possible total abdominal  hysterectomy.  The surgical risks of the procedure had been explained to  the patient.  Consent was signed, and the patient was transferred to the  operating room.   PROCEDURE:  Under adequate general anesthesia, the patient was placed in  the dorsal lithotomy position.  She was sterilely prepped and draped for  a laparoscopic procedure.  An indwelling Foley catheter was sterilely  placed.  Examination under anesthesia revealed an anteverted uterus.  No  adnexal masses could be appreciated.  The uterus was about 12-week size.   A bivalve speculum was placed in the vagina.  A single-tooth tenaculum  was placed on the anterior lip of the cervix.  An acorn cannula was  introduced into the cervical os and attached to the tenaculum for  manipulation of the uterus.  The bivalve speculum was then removed.  Attention was then turned to the abdomen.  Prior CT scan had suggested  possible umbilical hernia.  Therefore, an open laparoscopy was  performed.  The infraumbilical site was injected with 0.25% Marcaine.  A  transverse infraumbilical incision was made and carried down sharply to  the rectus fascia which was then opened transversely.  No contents of  bowel or any other findings were noted.  The parietal peritoneum was  opened as well without incident.  A pursestring suture of 0 Vicryl was  then placed, and a Hasson cannula was introduced into the abdomen.  A  lighted video laparoscope was then introduced into the abdominal cavity.  On inspection, it was noted that there was an omental adhesion to the  anterior abdominal wall.  The left tube and ovary were noted to be  normal.  The posterior cul-de-sac, with the uterus being retroverted,  was normal.  No adhesions noted.  The right tube and ovary could be seen  posteriorly.  Of  note was the anterior right aspect of the uterus, in  particular the serosal surface.  Basically, the uterus and bladder was  attached to the anterior abdominal wall from midline to the right of the  uterus.  Two other ports were then placed inferiorly under direct  visualization.  5-mm ports were placed.  The omental adhesion was lysed.  Demarcation between the bladder and the adhesion of the uterine wall was  not clearly defined.  The round ligament on the left was identified,  cauterized and cut.  The left utero-ovarian ligament was ultimately  serially clamped, cut, and cauterized.  The right round ligament, which  was noted to be extremely shortened, was cauterized and then cut.  The  right utero-ovarian ligament was also clamped, cauterized, and cut   serially.  The attention was then turned to this large abdominal wall  adhesion.  The gyrus was then utilized to carefully clamp, cauterize,  and cut the uterine wall off of the abdomen.  This was carefully carried  down with the use of a combination of gyrus and the hot scissors.  This  was continued until there was evidence of possible bladder reflection at  which time the scissors were then used for sharp dissection until the  uterus was then dissected off the anterior abdominal wall.  The lysis of  adhesions took about an hour and a half.  The bladder was retrograde  filled with sterile milk in order to demarcate the bladder and help to  facilitate the surgery.  The portion of the uterine wall that was still  adherent to the anterior abdominal wall was then taken off of the  anterior abdominal wall with careful dissection.  It was then noted that  there was some bleeding encountered.  With the insufflation of the  bladder, it was then noted that some of that adhesion was actually on  the serosal surface of the bladder wall that was still adherent to the  anterior abdominal wall, and this was carefully cauterized.  The  bladder, which had been filled with 500 mL of milk, was intact.  No  evidence of fluid leakage noted.  The superficial serosal surface that  was bleeding was carefully cauterized and good hemostasis noted.  At  that point, the abdomen was then irrigated, suctioned, bladder partially  emptied and decision was made to proceed vaginally.   The abdomen was deflated.  The ports remained, but the instruments in  the ports were removed.  Attention was then turned to the vagina.  The  bladder was deflated.  A weighted speculum was placed in the vagina.  The anterior posterior lip of the cervix was grasped with [place]  clamps.  A dilute solution of Pitressin was injected circumferentially  at the cervicovaginal junction.  A circumferential incision was made at  that junction,  and attempt to enter the posterior cul-de-sac with sharp  dissection was initially unsuccessful.  Rectovaginal exam was performed  to delineate where the reflection of the rectum was, and it appeared to  be still high in the vault at which time a decision was then made to do  inverted triangle dissection off of the mucosa posteriorly.  This was  done. It still did not facilitate entry into the posterior cul-de-sac.  Attention was then turned anteriorly where sharp dissection was then  performed.  The cervix was elongated.  The LigaSure was then utilized to  cauterize in the areas that were clearly visible and not near  any vital  structure.  This was continued, and once again, attempt at opening the  anterior cul-de-sac was then continued until it was actually successful.  The anterior cul-de-sac was entered.  What appeared to be the  uterosacral ligament was clamped and cut.  Subsequently, the posterior  cul-de-sac was indeed opened.  The posterior cuff was reefed with 0  Monocryl running locked stitch.  The vertical opening of the mucosa was  closed in a vertical fashion with interrupted 0 Vicryl sutures.  The  uterosacral ligaments were bilaterally clamped, cut, and suture ligated  with 0 Vicryl.  The LigaSure was then used to cauterize and subsequently  severe the uterine vessels and the cardinal attachments to the uterus  until the uterus was then able to be removed.  In removal of the uterus,  a large amount of fluid was noted.  Indigo carmine was then injected IV  which showed up in the Foley catheter and not in the vagina, consistent  with no trauma to the bladder or to the ureters.  It was then noted  there was some bleeding on the left angle of the vaginal cuff.  Several  figure-of-eight 0 Vicryl sutures were then placed.  The rectal mucosal  area was then reapproximated due to separation in the manipulation of  the uterus, and that was again closed with 0 Vicryl stitches.  On  the  bladder surface, small bleeders were cauterized, and decision was then  made to then close the vaginal cuff transversely to facilitate the  surgery, and this was done with interrupted 0 Vicryl sutures.  Good  hemostasis was then noted.   Attention was then turned back to the abdomen.  The abdomen was then  reinsufflated.  The lighted video laparoscope was then reinserted.  The  abdomen was then copiously irrigated.  Small bleeders of the cuff were  then carefully cauterized.  The pedicles were inspected and no bleeders  noted.  The abdomen was then partially deflated in order to assess the  integrity of any bleeders.  None was seen, and any additional bleeding  had been cauterized.  Decision was then made to remove the instruments  under direct visualization.  The umbilical site was closed with a  pursestring stitch and the skin approximated with subcuticular 4-0  Vicryl.  The lower incisions were closed with Dermabond.   SPECIMENS:  Uterus with cervix sent to pathology.   ESTIMATED BLOOD LOSS:  250-300 mL.   INTRAOPERATIVE FLUID:  2500 mL crystalloid.   URINE OUTPUT:  600 mL.   COUNTS:  Sponge and instrument counts x2 was correct.   COMPLICATIONS:  None.   The patient tolerated the procedure well and was transferred to the  recovery room in stable condition.  Surgery time of about 3 1/2 hours      Maxie Better, M.D.  Electronically Signed     [place]/MEDQ  D:  12/02/2007  T:  12/04/2007  Job:  [id number]

## 2011-05-01 NOTE — Group Therapy Note (Signed)
NAME:  Abigail Campos, ZUBA NO.:  [id number]   MEDICAL RECORD NO.:  [id number]          PATIENT TYPE:  WOC   LOCATION:  [facility]                   FACILITY:  [facility]   PHYSICIAN:  Argentina Donovan, MD        DATE OF BIRTH:  February 28, 1971   DATE OF SERVICE:  07/02/2005                                    CLINIC NOTE   HISTORY OF PRESENT ILLNESS:  The patient is a 40 year old gravida 1, para 1-  0-0-1 who has a daughter 46 years old by the same father who the patient  married one year ago.  She has been using condoms for contraception, but  decided she would like to get pregnant again around December.  She is going  to start trying at that point.  The patient went in to the MAU recently  because of her first, as she states, yeast infection.  During that  examination, the examiner thought that the patient's uterus was enlarged,  and sent her for ultrasound which showed a  13.0-cm uterus with changes that appeared to mimic adenomyosis.  In  discussing with the patient, she has long, drawn out periods that last from  2 weeks to a month, and at times, has to change a pad every hour or 2, and  laboratory testing in the MAU revealed that the patient had a hemoglobin of  9.8 with a hematocrit of 31.5.  She has not been on any iron.  We have  talked to her about trying to control the bleeding with birth control pills.  She is not anxious to do that because she will be trying to get pregnant.  We have discussed with the patient using iron twice a day, and in addition  to that, folic acid to help with her anemia but also in preparation for  getting pregnant.  She agreed with that.  I suggested that in November she  gets another blood count to see how it has improved.  At that time, she  plans on starting to see a private doctor, Dr. Cherly Hensen.  The ultrasound  thought that she had adenomyosis.  This is possible.   PHYSICAL EXAMINATION:  GENITALIA:  External genitalia was normal.  BUS  within  normal limits.  The vagina is clean and well rugated.  The cervix is  clean and parous, and the uterus is very slightly enlarged, and soft in  consistency with normal adnexa.  Cul-de-sac was free.   IMPRESSION:  Anemia secondary to menorrhagia.   PLAN:  Presently, iron and folic acid.       PR/MEDQ  D:  07/02/2005  T:  07/02/2005  Job:  [id number]

## 2011-05-01 NOTE — Op Note (Signed)
NAME:  Abigail Campos, Abigail Campos NO.:  [id number]   MEDICAL RECORD NO.:  [id number]          PATIENT TYPE:  MAT   LOCATION:  MATC                          FACILITY:  [facility]   PHYSICIAN:  Gerri Spore B. Earlene Plater, M.D.  DATE OF BIRTH:  June 04, 1971   DATE OF PROCEDURE:  02/18/2006  DATE OF DISCHARGE:                                 OPERATIVE REPORT   PREOPERATIVE DIAGNOSES:  1.  Intrauterine pregnancy at 36 weeks.  2.  Previous cesarean section.  3.  Plans repeat cesarean section.  4.  Active labor.  5.  Desires tubal sterilization.   POSTOPERATIVE DIAGNOSES:  1.  Intrauterine pregnancy at 36 weeks.  2.  Previous cesarean section.  3.  Plans repeat cesarean section.  4.  Active labor.  5.  Desires tubal sterilization.   OPERATION/PROCEDURE:  1.  Repeat low transverse cesarean section.  2.  Unable to perform tubal sterilization due to severe adhesions.   SURGEON:  Chester Holstein. Earlene Plater, M.D.   ASSISTANT:  None.   ANESTHESIA:  Spinal.   SPECIMENS:  None.   ESTIMATED BLOOD LOSS:  400.   COMPLICATIONS:  None.   INDICATIONS:  The patient presented in active labor at 36 weeks .  History  of previous cesarean section, for repeat.  The patient also requested tubal  sterilization and was advised of the risks of surgery including bleeding,  infection, damage to surrounding organs as well as tubal failure rate and  ectopic risks should pregnancy occur.   DESCRIPTION OF PROCEDURE:  The patient was taken to the operating room and  spinal anesthesia was obtained.  She was prepped and draped in the standard  fashion and Foley catheter inserted into the bladder.  A Pfannenstiel  incision was made through the previous scar and carried sharply to the  fascia.  The fascia was elevated and dissected away sharply from the  underlying rectus muscles.  It was noted to be densely adherent.  Posterior  sheath and peritoneum were elevated and entered sharply.  Bladder was noted  to be densely  adherent to the uterus and the uterus superior to this to be  densely adherent to the abdominal wall.  Plane was created between the  bladder and uterus sharply.  Bladder blade inserted.  Uterine incision made  in the low transverse fashion with a knife.  Clear fluid on amniotomy.  The  incision was extended laterally with bandage scissors.  The infant's head  elevated through the incision. Nose and mouth suctioned with the bulb and  the remainder of the infant delivered without difficulty.  The cord was  clamped and cut.  Infant handed off to the awaiting pediatricians.  One gram  of Ancef was given at cord clamp.   Uterus was cleared of all clots and debris.  Incision was free of extension.  It was closed in two layers with 0 chromic with hemostasis.   Uterus densely adherent to the abdominal wall superior to this and access  could not be gained to the upper portion of the uterus to perform a tubal  ligation.   The pelvis was  irrigated.  Uterus inspected and found to be hemostatic.  The  bladder flap was also hemostatic.  The bladder appeared unharmed.  Fascia  was closed with a running stitch of 0 Vicryl.  Subcutaneous tissue was  irrigated and made hemostatic and skin was closed with staples.   The patient tolerated the procedure well with no complications.  She was  taken to the recovery room awake, alert, in stable condition.  All counts  correct by the operating room staff.      Gerri Spore B. Earlene Plater, M.D.  Electronically Signed     WBD/MEDQ  D:  02/18/2006  T:  02/18/2006  Job:  [id number]

## 2011-05-01 NOTE — Discharge Summary (Signed)
NAME:  Abigail Campos, Abigail Campos NO.:  [id number]   MEDICAL RECORD NO.:  [id number]          PATIENT TYPE:  INP   LOCATION:  9110                          [facility]ACILITY:  [facility]   PHYSICIAN:  Gerri Spore B. Earlene Plater, M.D.  DATE OF BIRTH:  1971-11-09   DATE OF ADMISSION:  02/17/2006  DATE OF DISCHARGE:  02/21/2006                                 DISCHARGE SUMMARY   ADMISSION DIAGNOSES:  1.  Previous cesarean section.  2.  Labor.   DISCHARGE DIAGNOSES:  1.  Previous cesarean section.  2.  Labor.   PROCEDURE:  Repeat C-section.   HISTORY OF PRESENT ILLNESS:  A 40 year old African-American female presented  at 36+ weeks in active labor.  Previous C-section and plans for repeat.   HOSPITAL COURSE:  The patient was admitted and underwent repeat low  transverse C-section with findings of a viable female, 6 pounds 3 ounces,  Apgars 8 and 9.  There were dense adhesions from the uterus to the upper  abdominal wall with limited visualization of the uterine fundus and adnexa,  therefore precluding tubal ligation.   Postoperatively, the patient regains her ability to ambulate, void, and  tolerate a regular diet.  She was discharged home on the third postoperative  day in satisfactory condition.   DISCHARGE INSTRUCTIONS:  Routine preprinted instructions given prior to  dismissal.   DISCHARGE MEDICATIONS:  1.  Tylox one to two p.o. q.4-6h. p.r.n. pain.  2.  Ferrous sulfate 325 mg p.o. daily.   [facility].   DISPOSITION AT DISCHARGE:  Satisfactory.      Gerri Spore B. Earlene Plater, M.D.  Electronically Signed     WBD/MEDQ  D:  03/26/2006  T:  03/26/2006  Job:  [id number]

## 2011-05-01 NOTE — H&P (Signed)
NAME:  Abigail Campos, Abigail Campos                        ACCOUNT NO.:  [id number]   MEDICAL RECORD NO.:  [id number]                   PATIENT TYPE:  EMS   LOCATION:  ED                                   [facility]ACILITY:  [facility]   PHYSICIAN:  Lorne Skeens. Hoxworth, M.D.          DATE OF BIRTH:  October 01, 1971   DATE OF ADMISSION:  02/04/2003  DATE OF DISCHARGE:                                HISTORY & PHYSICAL   CHIEF COMPLAINT:  Abdominal pain, nausea, and vomiting.   HISTORY OF PRESENT ILLNESS:  The patient is a 40 year old black female in  her usual state of health until yesterday when she developed the fairly  onset of constant mid epigastric abdominal pain.  She was nauseated  yesterday and has vomited on a couple of occasions today.  The pain  persisted and she presented to the [facility] Room.  She  has noted a mass in her epigastrium for about the past year, which has not  been particularly painful or tender, but is larger and very tender during  this illness.  Bowel movements have been normal.  No fever or chills.   PAST SURGICAL HISTORY:  Significant only for C-section.   PAST MEDICAL HISTORY:  She is treated for type 2 diabetes.   MEDICATIONS:  Glucophage 500 mg once daily.   ALLERGIES:  She is allergic to ASPIRIN which causes swelling.   SOCIAL HISTORY:  Does not smoke cigarettes or drink alcohol.   FAMILY HISTORY:  Parents alive without significant illness.   REVIEW OF SYSTEMS:  GENERAL:  No fever, chills, or weight change.  PULMONARY:  Denies shortness of breath, cough, or wheezing.  CARDIAC:  Denies palpitations and history of cardiac disease.  URINARY:  No burning or  frequency.  HEMATOLOGIC:  No history of blood clots or abnormal bleeding.   PHYSICAL EXAMINATION:  VITAL SIGNS:  The temperature is 97.7 degrees, pulse  75, respirations 20, and blood pressure 169/84.  GENERAL APPEARANCE:  A mildly obese black female in no acute distress.  SKIN:  Warm and dry  without rash or infection.  HEENT:  No adenopathy or thyromegaly.  Sclerae nonicteric.  Nares and  oropharynx clear.  LUNGS:  Clear to auscultation.  CARDIAC:  Regular rate and rhythm without murmurs.  No edema.  ABDOMEN:  Mild distention.  There is a 4-5 cm very tender mass in the  midline several centimeters above the umbilicus consistent with a tender  incarcerated hernia.  The abdomen is mildly distended and tympanitic.  EXTREMITIES:  No edema or deformity.  NEUROLOGIC:  Alert and fully oriented.  The motor and sensory is grossly  normal.   LABORATORY DATA:  White count 4400, hematocrit 35.3.  Electrolytes all  within normal limits.  Glucose 108.  LFTs and lipase normal.  Urinalysis  negative.   X-RAYS:  Chest x-ray within normal limits.  Flat and upright of the abdomen  shows one  or two possibly mildly dilated loops of small bowel.   ASSESSMENT AND PLAN:  Incarcerated primary epigastric hernia with possible  early bowel obstruction.  I have recommended immediate operative repair.  The nature of the procedure, indications, and risks were discussed with the  patient and family preoperatively.                                               Lorne Skeens. Hoxworth, M.D.    Tory Emerald  D:  02/04/2003  T:  02/04/2003  Job:  [id number]

## 2011-05-01 NOTE — Op Note (Signed)
NAME:  Abigail Campos, Abigail Campos                        ACCOUNT NO.:  [id number]   MEDICAL RECORD NO.:  [id number]                   PATIENT TYPE:  INP   LOCATION:  0103                                 FACILITY:  [facility]   PHYSICIAN:  Sharlet Salina T. Hoxworth, M.D.          DATE OF BIRTH:  Nov 26, 1971   DATE OF PROCEDURE:  02/04/2003  DATE OF DISCHARGE:                                 OPERATIVE REPORT   PREOPERATIVE DIAGNOSIS:  Incarcerated primary ventral hernia.   POSTOPERATIVE DIAGNOSIS:  Incarcerated primary ventral hernia.   PROCEDURE:  Repair of incarcerated primary ventral hernia with mesh.   SURGEON:  Lorne Skeens. Hoxworth, M.D.   ANESTHESIA:  General.   INDICATIONS FOR PROCEDURE:  The patient is a 40 year old black female who  presented with acute epigastric pain of 24 hours' duration and some nausea  and vomiting.  Examination revealed a tender, incarcerated hernia mass about  4-5 cm in diameter in the mid-epigastrium. She had not had any previous  surgery in this area.  Emergency repair has been recommended and accepted.  The nature of procedure, indications, risks of bleeding, infection,  recurrence and use of mesh were discussed and understood.  The patient was  now brought to the operating room for this procedure.   DESCRIPTION OF PROCEDURE:  The patient was taken to the operating room,  placed in the  supine position on the operating table and general  endotracheal anesthesia was induced.  PS's were in place.  She was given  preoperative antibiotics. The abdomen was sterilely prepped and draped.  A  midline incision was made directly over the palpable mass and dissection was  carried to the subcutaneous tissue.  The hernia sac was encountered and it  was completely dissected away from surrounding tissue, and freed down to its  neck in the midline fascia.  There was a small peritoneal sac and a fair  amount of preperitoneal fat. There was no hemorrhage or fluid within the sac  and the contents had apparently reduced after the patient was under general  anesthesia. I did not see any signs that there had been any inflammatory  reaction or tissue damage within the sac.  I enlarged the fascial defect  slightly and reduced the hernia sac and preperitoneal fat and then cleared  the fascia back in the preperitoneal space for about 1 cm in all directions  to allow closure.  Skin and subcutaneous flaps were raised back away from  the edges of the defect as well for about 3 cm in all directions. The defect  measured about 1.5 cm in diameter. I closed this transversely with  interrupted 0-Prolene sutures.   Following this, a piece of Parietex mesh about 6 x 5 cm in diameter was used  as an onlay patch and it was sutured at its periphery to the anterior fascia  with interrupted 2-0 Prolene.  The subcutaneous tissue was then closed with  interrupted  3-0 Monocryl and skin was closed with running subcuticular 4-0  Monocryl and Steri-Strips.  Sponge,instrument and needle counts were correct.  Dry sterile dressing was  applied. The patient was taken to the recovery room in good condition,  having tolerated the procedure well.                                                  Lorne Skeens. Hoxworth, M.D.    Tory Emerald  D:  02/04/2003  T:  02/04/2003  Job:  [id number]

## 2011-05-17 ENCOUNTER — Emergency Department (HOSPITAL_BASED_OUTPATIENT_CLINIC_OR_DEPARTMENT_OTHER)
Admission: EM | Admit: 2011-05-17 | Discharge: 2011-05-17 | Disposition: A | Discharge status: Home / Self Care | Payer: Managed Care, Other (non HMO) | Attending: Emergency Medicine | Admitting: Emergency Medicine

## 2011-05-17 DIAGNOSIS — J029 Acute pharyngitis, unspecified: Secondary | ICD-10-CM | POA: Insufficient documentation

## 2011-05-17 DIAGNOSIS — I10 Essential (primary) hypertension: Secondary | ICD-10-CM | POA: Insufficient documentation

## 2011-05-17 LAB — RAPID STREP SCREEN (MED CTR MEBANE ONLY): Streptococcus, Group A Screen (Direct): NEGATIVE

## 2011-08-18 ENCOUNTER — Encounter: Payer: Self-pay | Admitting: *Deleted

## 2011-08-18 ENCOUNTER — Encounter: Payer: Self-pay | Admitting: Internal Medicine

## 2011-08-19 ENCOUNTER — Ambulatory Visit (INDEPENDENT_AMBULATORY_CARE_PROVIDER_SITE_OTHER): Payer: Managed Care, Other (non HMO) | Admitting: Internal Medicine

## 2011-08-19 ENCOUNTER — Encounter: Payer: Self-pay | Admitting: Internal Medicine

## 2011-08-19 DIAGNOSIS — R51 Headache: Secondary | ICD-10-CM

## 2011-08-19 DIAGNOSIS — I10 Essential (primary) hypertension: Secondary | ICD-10-CM

## 2011-08-19 DIAGNOSIS — F411 Generalized anxiety disorder: Secondary | ICD-10-CM

## 2011-08-19 LAB — CBC WITH DIFFERENTIAL/PLATELET
Basophils Absolute: 0 10*3/uL (ref 0.0–0.1)
Basophils Relative: 0.2 % (ref 0.0–3.0)
Eosinophils Absolute: 0.1 10*3/uL (ref 0.0–0.7)
Eosinophils Relative: 1.1 % (ref 0.0–5.0)
HCT: 39.4 % (ref 36.0–46.0)
Hemoglobin: 12.8 g/dL (ref 12.0–15.0)
Lymphocytes Relative: 35.4 % (ref 12.0–46.0)
Lymphs Abs: 2.1 10*3/uL (ref 0.7–4.0)
MCHC: 32.4 g/dL (ref 30.0–36.0)
MCV: 92.3 fl (ref 78.0–100.0)
Monocytes Absolute: 0.4 10*3/uL (ref 0.1–1.0)
Monocytes Relative: 7.3 % (ref 3.0–12.0)
Neutro Abs: 3.4 10*3/uL (ref 1.4–7.7)
Neutrophils Relative %: 56 % (ref 43.0–77.0)
Platelets: 214 10*3/uL (ref 150.0–400.0)
RBC: 4.27 Mil/uL (ref 3.87–5.11)
RDW: 14.2 % (ref 11.5–14.6)
WBC: 6 10*3/uL (ref 4.5–10.5)

## 2011-08-19 LAB — BASIC METABOLIC PANEL
BUN: 9 mg/dL (ref 6–23)
CO2: 32 mEq/L (ref 19–32)
Calcium: 9.1 mg/dL (ref 8.4–10.5)
Chloride: 100 mEq/L (ref 96–112)
Creatinine, Ser: 0.7 mg/dL (ref 0.4–1.2)
GFR: 118.61 mL/min (ref >60.00)
Glucose, Bld: 89 mg/dL (ref 70–99)
Potassium: 3.5 mEq/L (ref 3.5–5.1)
Sodium: 137 mEq/L (ref 135–145)

## 2011-08-19 MED ORDER — NEBIVOLOL HCL 20 MG PO TABS: 20.0000 mg | ORAL_TABLET | Freq: Every day | ORAL | Status: DC

## 2011-08-19 MED ORDER — ZOLPIDEM TARTRATE 10 MG PO TABS: 10.0000 mg | ORAL_TABLET | Freq: Every evening | ORAL | Status: DC | PRN

## 2011-08-19 NOTE — Progress Notes (Signed)
  Subjective:    Patient ID: Abigail Campos, female    DOB: April 03, 1971, 40 y.o.   MRN: [id number]  HPI Very anxious lately, in July and August she lost 2 uncles to CAD/CHF. Also mother was diagnosed with colon cancer at age 12. Hypertension--BP was well-controlled until July, since then blood pressure has been high despite good medication compliance. Headaches--she had moderate headache and some blurred vision last week, went to urgent care, received Toradol and Phenergan and since then she is better. She was also prescribed Motrin but is not taking it.  Past Medical History  Diagnosis Date  . Headache     developed migraines after 2nd pregnancy, had a negative CT of the head  . Anemia     NOS  . Hypertension    Past Surgical History  Procedure Date  . Inguinal hernia repair   . Cesarean section     [id number]  . Abdominal hysterectomy 11-14-07    on oophorectomy per surgical report     Family History  Problem Relation Age of Onset  . Diabetes Mother   . Hypertension Mother   . Glaucoma Mother   . Heart disease      2 uncles died May 07, 2011 from CAD-CHF  . Stroke Maternal Grandfather   . Colon cancer      mother age 64    Review of Systems Denies any chest pain or shortness of breath No lower extremity edema Not sleeping well in the last few months,  denies depression per se (she is more anxious than anything else). Diet is healthier than before  and she is trying to avoid excessive salt on his  diet ; she has gained some weight.     Objective:   Physical Exam  Constitutional: She is oriented to person, place, and time. She appears well-developed and well-nourished. No distress.  HENT:  Head: Normocephalic and atraumatic.  Eyes: EOM are normal. Pupils are equal, round, and reactive to light.  Cardiovascular: Normal rate, regular rhythm and normal heart sounds.   Pulmonary/Chest: Effort normal and breath sounds normal. No respiratory distress. She has no wheezes.    Neurological: She is alert and oriented to person, place, and time. No cranial nerve deficit.  Skin: She is not diaphoretic.  Psychiatric:       Slightly anxious appearing          Assessment & Plan:

## 2011-08-19 NOTE — Assessment & Plan Note (Signed)
Recently seen at [facility] w/  Exhacerbation: now better

## 2011-08-19 NOTE — Patient Instructions (Addendum)
Increase bystolic Check the  blood pressure 2 or 3 times a week, be sure it is less than 140/85. If it is consistently higher, let me know ambien 1/2 or 1 tablet at bedtime as needed

## 2011-08-19 NOTE — Assessment & Plan Note (Signed)
BP well controlled up until July 2012, may be related to anxiety, poor sleep, see HPI Plan: Labs  Increase bystolic from 10 to 20mg 

## 2011-08-19 NOTE — Assessment & Plan Note (Addendum)
Counseled , for now will try to get her to sleep better, Rx [place]

## 2011-08-20 ENCOUNTER — Other Ambulatory Visit: Payer: Self-pay | Admitting: Internal Medicine

## 2011-08-20 MED ORDER — FELODIPINE ER 10 MG PO TB24: 10.0000 mg | ORAL_TABLET | Freq: Every day | ORAL | Status: DC

## 2011-08-20 NOTE — Telephone Encounter (Signed)
Rx Done . 

## 2011-09-18 LAB — CBC
HCT: 26.6 — ABNORMAL LOW
HCT: 35.3 — ABNORMAL LOW
Hemoglobin: 11.2 — ABNORMAL LOW
Hemoglobin: 8.6 — ABNORMAL LOW
MCHC: 31.8
MCHC: 32.3
MCV: 76.6 — ABNORMAL LOW
MCV: 77.1 — ABNORMAL LOW
Platelets: 185
Platelets: 194
RBC: 3.47 — ABNORMAL LOW
RBC: 4.58
RDW: 22.3 — ABNORMAL HIGH
RDW: 22.6 — ABNORMAL HIGH
WBC: 12.2 — ABNORMAL HIGH
WBC: 5.9

## 2011-09-18 LAB — BASIC METABOLIC PANEL
BUN: 4 — ABNORMAL LOW
CO2: 29
Calcium: 8.5
Chloride: 101
Creatinine, Ser: 0.78
GFR calc Af Amer: >60
GFR calc non Af Amer: >60
Glucose, Bld: 127 — ABNORMAL HIGH
Potassium: 3.8
Sodium: 135

## 2011-09-18 LAB — PREGNANCY, URINE: Preg Test, Ur: NEGATIVE

## 2011-09-21 LAB — CROSSMATCH
ABO/RH(D): B POS
Antibody Screen: NEGATIVE

## 2011-09-22 LAB — URINALYSIS, ROUTINE W REFLEX MICROSCOPIC
Bilirubin Urine: NEGATIVE
Glucose, UA: NEGATIVE
Ketones, ur: 15 — AB
Leukocytes, UA: NEGATIVE
Nitrite: NEGATIVE
Protein, ur: NEGATIVE
Specific Gravity, Urine: <1.005 — ABNORMAL LOW
Urobilinogen, UA: 0.2
pH: 7

## 2011-09-22 LAB — CBC
HCT: 25.6 — ABNORMAL LOW
HCT: 28.3 — ABNORMAL LOW
HCT: 30.4 — ABNORMAL LOW
Hemoglobin: 8 — ABNORMAL LOW
Hemoglobin: 8.9 — ABNORMAL LOW
Hemoglobin: 9.4 — ABNORMAL LOW
MCHC: 31
MCHC: 31.3
MCHC: 31.4
MCV: 69 — ABNORMAL LOW
MCV: 69.2 — ABNORMAL LOW
MCV: 69.3 — ABNORMAL LOW
Platelets: 322
Platelets: 353
Platelets: 436 — ABNORMAL HIGH
RBC: 3.7 — ABNORMAL LOW
RBC: 4.09
RBC: 4.4
RDW: 25.7 — ABNORMAL HIGH
RDW: 25.8 — ABNORMAL HIGH
RDW: 27.8 — ABNORMAL HIGH
WBC: 11.4 — ABNORMAL HIGH
WBC: 12.4 — ABNORMAL HIGH
WBC: 6.4

## 2011-09-22 LAB — GC/CHLAMYDIA PROBE AMP, URINE
Chlamydia, Swab/Urine, PCR: NEGATIVE
GC Probe Amp, Urine: NEGATIVE

## 2011-09-22 LAB — DIFFERENTIAL
Band Neutrophils: 2
Basophils Absolute: 0
Basophils Relative: 0
Basophils Relative: 0
Blasts: 0
Eosinophils Absolute: 0
Eosinophils Relative: 0
Eosinophils Relative: 0
Lymphocytes Relative: 10 — ABNORMAL LOW
Lymphocytes Relative: 26
Lymphs Abs: 1.7
Metamyelocytes Relative: 0
Monocytes Absolute: 0.4
Monocytes Relative: 0 — ABNORMAL LOW
Monocytes Relative: 7
Myelocytes: 0
Neutro Abs: 4.3
Neutrophils Relative %: 67
Neutrophils Relative %: 88 — ABNORMAL HIGH
Promyelocytes Absolute: 0
nRBC: 0

## 2011-09-22 LAB — URINE CULTURE
Colony Count: NO GROWTH
Culture: NO GROWTH
Special Requests: NEGATIVE

## 2011-09-22 LAB — FACTOR 5 LEIDEN

## 2011-09-22 LAB — ANA: Anti Nuclear Antibody(ANA): NEGATIVE

## 2011-09-22 LAB — APTT: aPTT: 28

## 2011-09-22 LAB — PROTHROMBIN GENE MUTATION

## 2011-09-22 LAB — URINE MICROSCOPIC-ADD ON: WBC, UA: NONE SEEN

## 2011-09-22 LAB — PROTEIN S ACTIVITY: Protein S Activity: 53 % — ABNORMAL LOW (ref 69–129)

## 2011-09-22 LAB — PROTIME-INR
INR: 1
Prothrombin Time: 13.7

## 2011-09-22 LAB — PROTEIN S, TOTAL: Protein S Ag, Total: 84 % (ref 70–140)

## 2011-09-22 LAB — PROTEIN C, TOTAL: Protein C, Total: 81 % (ref 70–140)

## 2011-09-22 LAB — PROTEIN C ACTIVITY: Protein C Activity: 124 % (ref 75–133)

## 2011-09-22 LAB — VON WILLEBRAND PANEL
Factor-VIII Activity: 149 % (ref 75–150)
Ristocetin Co-Factor: 52 % (ref 50–150)
Von Willebrand Ag: 155 % normal (ref 61–164)

## 2011-09-23 LAB — DIFFERENTIAL
Basophils Absolute: 0
Basophils Relative: 0
Basophils Relative: 0
Eosinophils Absolute: 0
Eosinophils Relative: 0
Eosinophils Relative: 1
Lymphocytes Relative: 13
Lymphocytes Relative: 22
Lymphs Abs: 1.4
Monocytes Absolute: 0.2
Monocytes Relative: 17 — ABNORMAL HIGH
Monocytes Relative: 2 — ABNORMAL LOW
Neutro Abs: 9.3 — ABNORMAL HIGH
Neutrophils Relative %: 60
Neutrophils Relative %: 85 — ABNORMAL HIGH

## 2011-09-23 LAB — URINALYSIS, ROUTINE W REFLEX MICROSCOPIC
Bilirubin Urine: NEGATIVE
Bilirubin Urine: NEGATIVE
Glucose, UA: NEGATIVE
Glucose, UA: NEGATIVE
Hgb urine dipstick: NEGATIVE
Ketones, ur: NEGATIVE
Ketones, ur: NEGATIVE
Leukocytes, UA: NEGATIVE
Nitrite: NEGATIVE
Nitrite: NEGATIVE
Protein, ur: NEGATIVE
Protein, ur: NEGATIVE
Specific Gravity, Urine: 1.021
Specific Gravity, Urine: 1.023
Urobilinogen, UA: 0.2
Urobilinogen, UA: 1
pH: 6.5
pH: 7

## 2011-09-23 LAB — CROSSMATCH
ABO/RH(D): B POS
Antibody Screen: NEGATIVE

## 2011-09-23 LAB — CBC
HCT: 22.4 — ABNORMAL LOW
HCT: 27.5 — ABNORMAL LOW
HCT: 31.3 — ABNORMAL LOW
Hemoglobin: 6.3 — CL
Hemoglobin: 8.4 — ABNORMAL LOW
Hemoglobin: 9.4 — ABNORMAL LOW
MCHC: 28.3 — ABNORMAL LOW
MCHC: 30
MCHC: 30.7
MCV: 61.6 — ABNORMAL LOW
MCV: 67.8 — ABNORMAL LOW
MCV: 69.1 — ABNORMAL LOW
Platelets: 349
Platelets: 448 — ABNORMAL HIGH
Platelets: 479 — ABNORMAL HIGH
RBC: 3.64 — ABNORMAL LOW
RBC: 4.06
RBC: 4.53
RDW: 22.2 — ABNORMAL HIGH
RDW: 27.7 — ABNORMAL HIGH
RDW: 27.9 — ABNORMAL HIGH
WBC: 10
WBC: 10.9 — ABNORMAL HIGH
WBC: 9.4

## 2011-09-23 LAB — COMPREHENSIVE METABOLIC PANEL
ALT: 12
ALT: 36 — ABNORMAL HIGH
AST: 19
AST: 27
Albumin: 3.8
Albumin: 3.8
Alkaline Phosphatase: 62
Alkaline Phosphatase: 65
BUN: 7
BUN: 9
CO2: 22
CO2: 28
Calcium: 8.2 — ABNORMAL LOW
Calcium: 9.2
Chloride: 106
Chloride: 99
Creatinine, Ser: 0.72
Creatinine, Ser: 0.88
GFR calc Af Amer: >60
GFR calc Af Amer: >60
GFR calc non Af Amer: >60
GFR calc non Af Amer: >60
Glucose, Bld: 104 — ABNORMAL HIGH
Glucose, Bld: 121 — ABNORMAL HIGH
Potassium: 3.5
Potassium: 3.7
Sodium: 133 — ABNORMAL LOW
Sodium: 140
Total Bilirubin: 0.4
Total Bilirubin: 0.5
Total Protein: 7.7
Total Protein: 7.9

## 2011-09-23 LAB — RETICULOCYTES
RBC.: 3.79 — ABNORMAL LOW
Retic Count, Absolute: 94.8
Retic Ct Pct: 2.5

## 2011-09-23 LAB — URINE CULTURE
Colony Count: NO GROWTH
Culture: NO GROWTH

## 2011-09-23 LAB — HEMOGLOBINOPATHY EVALUATION
Hemoglobin Other: 0 (ref 0.0–0.0)
Hgb A2 Quant: 1.4 — ABNORMAL LOW
Hgb A: 98.6 % — ABNORMAL HIGH
Hgb F Quant: 0 (ref 0.0–2.0)
Hgb S Quant: 0 % (ref 0.0–0.0)

## 2011-09-23 LAB — URINE MICROSCOPIC-ADD ON

## 2011-09-23 LAB — PROTIME-INR
INR: 1.1
Prothrombin Time: 14.3

## 2011-09-23 LAB — POCT PREGNANCY, URINE
Operator id: 25670
Preg Test, Ur: NEGATIVE

## 2011-09-23 LAB — LIPASE, BLOOD
Lipase: 27
Lipase: 27

## 2011-09-23 LAB — HCG, QUANTITATIVE, PREGNANCY: hCG, Beta Chain, Quant, S: <2

## 2011-09-23 LAB — APTT: aPTT: 27

## 2011-09-23 LAB — FERRITIN: Ferritin: 1 — ABNORMAL LOW (ref 10–291)

## 2011-09-23 LAB — PREPARE RBC (CROSSMATCH)

## 2011-09-23 LAB — ABO/RH: ABO/RH(D): B POS

## 2011-09-23 LAB — IRON: Iron: 29 — ABNORMAL LOW

## 2011-10-13 ENCOUNTER — Encounter: Payer: Self-pay | Admitting: Internal Medicine

## 2011-10-13 ENCOUNTER — Ambulatory Visit (INDEPENDENT_AMBULATORY_CARE_PROVIDER_SITE_OTHER): Payer: Managed Care, Other (non HMO) | Admitting: Internal Medicine

## 2011-10-13 DIAGNOSIS — K649 Unspecified hemorrhoids: Secondary | ICD-10-CM

## 2011-10-13 DIAGNOSIS — F411 Generalized anxiety disorder: Secondary | ICD-10-CM

## 2011-10-13 MED ORDER — TEMAZEPAM 15 MG PO CAPS: 15.0000 mg | ORAL_CAPSULE | Freq: Every evening | ORAL | Status: AC | PRN

## 2011-10-13 NOTE — Assessment & Plan Note (Signed)
Continue with mild anxiety but her main issue is not being able to sleep. Ambien did not work, try temazepam

## 2011-10-13 NOTE — Assessment & Plan Note (Addendum)
Presents w/ external hemorrhoids, no evidence of thrombosis She does have anal spasm: fissure? Thrombosed internal hemorrhoid? Plan: She is improving , will continue w/ conservative treatment for now, see instructions  If no better will refer to surgery Also has constipation x 6 days (not an uncommon situation for her ) thus will rec miralax

## 2011-10-13 NOTE — Patient Instructions (Addendum)
Colace 100 mg twice a day, myralax 17 gr a day with fluids, sitz baths Nupercainal if pain severe (OTC) Continue with anal steroid Also fill the Rx for diltiazem cream , use it 4 times a day x 1 week Call if no better in 1 week, call if you get worse

## 2011-10-13 NOTE — Progress Notes (Signed)
  Subjective:    Patient ID: Abigail Campos, female    DOB: 11-07-1971, 40 y.o.   MRN: [id number]  HPI H/o hemorrhoids, sx resurfaced 6 days ago: pain and swelling in that area, her gyn called in 2 creams (?nupercainal and a steroid cream) which are helping a little.  No actual pain or bleeding w/ BMs Also still having problems sleeping despite ambien (see last OV note)  Past Medical History  Diagnosis Date  . Headache     developed migraines after 2nd pregnancy, had a negative CT of the head  . Anemia     NOS  . Hypertension    Past Surgical History  Procedure Date  . Inguinal hernia repair   . Cesarean section     [id number]  . Abdominal hysterectomy 11-14-07    on oophorectomy per surgical report    Review of Systems No abd pain, no  N-V; some constipation  No rectal bleed amb BPs ok Anxiety still there but not as intense as before     Objective:   Physical Exam  Constitutional: She appears well-developed and well-nourished.  Genitourinary:       External: few small ext hemorrhoid, no tender to plapation. Perianal area free of redness or swelling. DRE: anal spasm, + pain, unable to evaluate           Assessment & Plan:  Today , I spent more tha25   min with the patient, >50% of the time counseling about her hemorrhoids and discussing the plan.

## 2011-10-25 ENCOUNTER — Telehealth: Payer: Self-pay | Admitting: Internal Medicine

## 2011-10-25 NOTE — Telephone Encounter (Signed)
Was seen w/ hemorrhoids recently, check on her: still having problems?  Bleeding? Pain? Constipation? Let me know

## 2011-10-27 NOTE — Telephone Encounter (Signed)
Attempted to call pt to no avail. Pt's voicemail is full

## 2011-10-27 NOTE — Telephone Encounter (Signed)
Call pt , see below

## 2011-11-10 ENCOUNTER — Emergency Department (HOSPITAL_BASED_OUTPATIENT_CLINIC_OR_DEPARTMENT_OTHER)
Admission: EM | Admit: 2011-11-10 | Discharge: 2011-11-10 | Disposition: A | Discharge status: Home / Self Care | Payer: Managed Care, Other (non HMO) | Attending: Emergency Medicine | Admitting: Emergency Medicine

## 2011-11-10 ENCOUNTER — Encounter (HOSPITAL_BASED_OUTPATIENT_CLINIC_OR_DEPARTMENT_OTHER): Payer: Self-pay | Admitting: Student

## 2011-11-10 DIAGNOSIS — I10 Essential (primary) hypertension: Secondary | ICD-10-CM | POA: Insufficient documentation

## 2011-11-10 DIAGNOSIS — J189 Pneumonia, unspecified organism: Secondary | ICD-10-CM | POA: Insufficient documentation

## 2011-11-10 DIAGNOSIS — J069 Acute upper respiratory infection, unspecified: Secondary | ICD-10-CM | POA: Insufficient documentation

## 2011-11-10 MED ORDER — DOXYCYCLINE HYCLATE 100 MG PO CAPS: 100.0000 mg | ORAL_CAPSULE | Freq: Two times a day (BID) | ORAL | Status: AC

## 2011-11-10 NOTE — ED Provider Notes (Signed)
History     [name]: [id number] Arrival date & time: 11/10/2011  2:11 PM   First MD Initiated Contact with Patient 11/10/11 1429      Chief Complaint  Patient presents with  . URI    (Consider location/radiation/quality/duration/timing/severity/associated sxs/prior treatment) Patient is a 40 y.o. female presenting with URI. The history is provided by the patient.  URI The primary symptoms include fever, fatigue, cough, wheezing and myalgias. Primary symptoms do not include sore throat, swollen glands, abdominal pain, nausea, vomiting or rash. Episode onset: 3 weeks. This is a new problem. The problem has been gradually worsening.  The fever began 2 days ago. The fever has been gradually worsening since its onset. The maximum temperature recorded prior to her arrival was 100 to 100.9 F. The temperature was taken by an oral thermometer.  The cough is non-productive.  The myalgias are generalized. The myalgias are not associated with weakness or swelling.  Associated with: No sick contacts.    Past Medical History  Diagnosis Date  . Headache     developed migraines after 2nd pregnancy, had a negative CT of the head  . Anemia     NOS  . Hypertension     Past Surgical History  Procedure Date  . Inguinal hernia repair   . Cesarean section     [id number]  . Abdominal hysterectomy 11-14-07    on oophorectomy per surgical report    Family History  Problem Relation Age of Onset  . Diabetes Mother   . Hypertension Mother   . Glaucoma Mother   . Heart disease      2 uncles died 05/20/2011 from CAD-CHF  . Stroke Maternal Grandfather   . Colon cancer      mother age 74  . Diabetes Other     siblings  . Hypertension Other     siblings    History  Substance Use Topics  . Smoking status: Unknown If Ever Smoked  . Smokeless tobacco: Not on file  . Alcohol Use: No    OB History    Grav Para Term Preterm Abortions TAB SAB Ect Mult Living   2 2              Review of Systems    Constitutional: Positive for fever and fatigue.  HENT: Negative for sore throat.   Respiratory: Positive for cough and wheezing.   Gastrointestinal: Negative for nausea, vomiting and abdominal pain.  Musculoskeletal: Positive for myalgias.  Skin: Negative for rash.  Neurological: Negative for weakness.  All other systems reviewed and are negative.    Allergies  Aspirin; Latex; and Peanut-containing drug products  Home Medications   Current Outpatient Rx  Name Route Sig Dispense Refill  . ALBUTEROL SULFATE HFA 108 (90 BASE) MCG/ACT IN AERS Inhalation Inhale 2 puffs into the lungs every 6 (six) hours as needed.      Marland Kitchen FELODIPINE 10 MG PO TB24 Oral Take 1 tablet (10 mg total) by mouth daily. 30 tablet 2  . ONE-DAILY MULTI VITAMINS PO TABS Oral Take 1 tablet by mouth 2 (two) times daily.      . NEBIVOLOL HCL 20 MG PO TABS Oral Take 1 tablet (20 mg total) by mouth daily. 30 tablet 6  . TEMAZEPAM 15 MG PO CAPS Oral Take 1 capsule (15 mg total) by mouth at bedtime as needed for sleep. 30 capsule 0    BP 165/107  Pulse 101  Temp(Src) 99.5 F (37.5 C) (Oral)  Resp 20  Wt 162 lb (73.483 kg)  SpO2 97%  Physical Exam  Nursing note and vitals reviewed. Constitutional: She is oriented to person, place, and time. She appears well-developed and well-nourished. No distress.  HENT:  Head: Normocephalic and atraumatic.  Right Ear: Tympanic membrane and ear canal normal.  Left Ear: Tympanic membrane and ear canal normal.  Nose: Mucosal edema and rhinorrhea present.  Mouth/Throat: Uvula is midline. No oropharyngeal exudate.  Eyes: EOM are normal. Pupils are equal, round, and reactive to light.  Neck: Normal range of motion. Neck supple.  Cardiovascular: Normal rate, regular rhythm, normal heart sounds and intact distal pulses.  Exam reveals no friction rub.   No murmur heard. Pulmonary/Chest: Effort normal. Not tachypneic. No respiratory distress. She has no wheezes. She has rhonchi in the  right lower field and the left lower field. She has no rales.  Abdominal: Soft. Bowel sounds are normal. She exhibits no distension. There is no tenderness. There is no rebound and no guarding.  Musculoskeletal: Normal range of motion. She exhibits no tenderness.       No edema  Neurological: She is alert and oriented to person, place, and time. No cranial nerve deficit.  Skin: Skin is warm and dry. No rash noted.  Psychiatric: She has a normal mood and affect. Her behavior is normal.    ED Course  Procedures (including critical care time)  Labs Reviewed - No data to display No results found.   No diagnosis found.    MDM   Pt with symptoms consistent with viral URI initially however now patient has persistent cough for 3 weeks and is running a fever the highest of 101 at home.  Patient seen by urgent care at the beginning of the month and given a Z-Pak which did not improve her symptoms. Patient was seen by urgent care yesterday where she had a negative chest x-ray and was getting given an inhaler which she said is not helping and the fever persisted today. Well appearing here.  No signs of breathing difficulty  No signs of pharyngitis, otitis or abnormal abdominal findings.  Rhonchi on bilateral lung bases. Due to negative chest x-ray yesterday feel that we do not need to repeat the chest x-ray today. Will cover with antibiotics and have patient return for any worsening symptoms.        Gwyneth Sprout, MD 11/10/11 [phone]

## 2011-11-10 NOTE — ED Notes (Signed)
Pt in with c/o uri s/sx x 2-3 weeks, recently seen at urgent care for similar issues and has completed a round of z pac but no relief.

## 2011-11-23 ENCOUNTER — Ambulatory Visit (INDEPENDENT_AMBULATORY_CARE_PROVIDER_SITE_OTHER): Payer: Managed Care, Other (non HMO) | Admitting: Internal Medicine

## 2011-11-23 ENCOUNTER — Encounter: Payer: Self-pay | Admitting: Internal Medicine

## 2011-11-23 DIAGNOSIS — I10 Essential (primary) hypertension: Secondary | ICD-10-CM

## 2011-11-23 DIAGNOSIS — J189 Pneumonia, unspecified organism: Secondary | ICD-10-CM | POA: Insufficient documentation

## 2011-11-23 DIAGNOSIS — Z0000 Encounter for general adult medical examination without abnormal findings: Secondary | ICD-10-CM

## 2011-11-23 LAB — LIPID PANEL
Cholesterol: 173 mg/dL (ref 0–200)
HDL: 39.2 mg/dL (ref >39.00)
LDL Cholesterol: 124 mg/dL — ABNORMAL HIGH (ref 0–99)
Total CHOL/HDL Ratio: 4
Triglycerides: 48 mg/dL (ref 0.0–149.0)
VLDL: 9.6 mg/dL (ref 0.0–40.0)

## 2011-11-23 LAB — TSH: TSH: 0.82 u[IU]/mL (ref 0.35–5.50)

## 2011-11-23 LAB — ALT: ALT: 21 U/L (ref 0–35)

## 2011-11-23 LAB — AST: AST: 22 U/L (ref 0–37)

## 2011-11-23 MED ORDER — HYDROCODONE-HOMATROPINE 5-1.5 MG/5ML PO SYRP: 5.0000 mL | ORAL_SOLUTION | Freq: Four times a day (QID) | ORAL | Status: AC | PRN

## 2011-11-23 NOTE — Assessment & Plan Note (Signed)
Respiratory symptoms for 2 or 3 weeks, status post a Z-Pak and doxycycline. Denies fever but still coughing. Plan: Discontinue decongestants, hydrocodone for cough control, chest x-ray. Call if not well in two-week

## 2011-11-23 NOTE — Patient Instructions (Signed)
Get a chest x-ray For cough---->  Mucinex DM twice a day, if the cough continue use  hydrocodone. Will make you  He is taking well orally if severe cough or wheezing. Call in 2 weeks if not better. Check the  blood pressure 2 or 3 times a week, be sure it is less than 140/85. If it is consistently higher, let me know Next routine visit in 3 months to check your BP.

## 2011-11-23 NOTE — Assessment & Plan Note (Addendum)
Td 2009 per pt  Has not have a  flu shot this year, rec to have it once cough is better Diet and exercise discussed She has a family history of colon cancer, start screening at age of 13. FH of CAD, plan is to control CV RF Female care  per gynecology Labs

## 2011-11-23 NOTE — Progress Notes (Signed)
  Subjective:    Patient ID: Abigail Campos, female    DOB: May 25, 1971, 40 y.o.   MRN: [id number]  HPI Complete physical exam Having respiratory issues for about 2-3 weeks. See review of systems  Past Medical History: Gyn-- Dr Cherly Hensen G2 P2 Headache - developed migraines after 2nd pregnancy, had a negative CT of the head Hypertension   Past Surgical History: Inguinal herniorrhaphy (12/14/2002) Caesarean section (12/14/1996) Caesarean section (12/14/2005) Hysterectomy 12-08 (no oophorectomy per surgical report)  Family History: DM-- M HTN-- M CAD-- 2 uncles (67, 50y/0), 1 aunt, GM GLAUCOMA--M STROKE-- GM Colon ca-- M dx at age 51 Breast ca -- M   Risk Factors:  Married, 2 children  Tobacco use:  never Drug use:  no Alcohol use:  no Exercise:  Yes, walks every day Diet:usually good    Review of Systems Went to the urgent care about 2-3 weeks ago w/respiratory symptoms, reports that a flu test was negative. She was prescribed a Z-Pak and hydrocodone. After several visits, she did no feel better. Eventually he went to the ER, reportedly was diagnosed with pneumonia, no chest x-ray in the system. She was prescribed doxycycline which she took, an inhaler and Zutripro ---> at this point she is not running fevers but she has persistent dry cough. No wheezing per se but uses the inhaler at night. No sinus congestion, pain or discharge. No nausea, vomiting, diarrhea Medication compliance with BP meds, ambulatory BP usually normal but lately since she is taking the cough syrup it has been elevated, zutripro has a decongestant    Objective:   Physical Exam  Constitutional: She is oriented to person, place, and time. She appears well-developed and well-nourished.  HENT:  Head: Normocephalic and atraumatic.  Neck: No thyromegaly present.  Cardiovascular: Normal rate, regular rhythm and normal heart sounds.   No murmur heard. Pulmonary/Chest: Effort normal and breath  sounds normal. No respiratory distress. She has no wheezes. She has no rales.  Abdominal: Soft. She exhibits no distension. There is no tenderness. There is no rebound and no guarding.  Musculoskeletal: She exhibits no edema.  Neurological: She is alert and oriented to person, place, and time.  Psychiatric: She has a normal mood and affect. Her behavior is normal. Judgment and thought content normal.       Assessment & Plan:

## 2011-11-23 NOTE — Assessment & Plan Note (Signed)
Not well controlled lately. Has been taking decongestants, plan: Discontinue decongestants Ambulatory monitoring

## 2011-11-26 ENCOUNTER — Encounter: Payer: Self-pay | Admitting: Internal Medicine

## 2012-01-11 ENCOUNTER — Telehealth: Payer: Self-pay | Admitting: Internal Medicine

## 2012-01-11 MED ORDER — FLUCONAZOLE 150 MG PO TABS: 150.0000 mg | ORAL_TABLET | Freq: Once | ORAL | Status: AC

## 2012-01-11 NOTE — Telephone Encounter (Signed)
Refill done.  

## 2012-01-11 NOTE — Telephone Encounter (Signed)
Patient states that she had a tooth pulled last week and was put on antibiotics, now patient states that she has a yeast infection from the antibiotics. Would like diflucan called in to [facility] on [address] and [address].

## 2012-01-11 NOTE — Telephone Encounter (Signed)
Pt return call to check status of request, called Pt back to advise that message had been given to Dr Drue Novel and is awaiting response, Pt VM is full unable to advise Pt will try to contact Pt again.

## 2012-01-11 NOTE — Telephone Encounter (Signed)
Diflucan 150 mg one tablet daily, x 2 days

## 2012-01-19 ENCOUNTER — Encounter: Payer: Self-pay | Admitting: Internal Medicine

## 2012-01-19 ENCOUNTER — Ambulatory Visit (INDEPENDENT_AMBULATORY_CARE_PROVIDER_SITE_OTHER): Payer: Managed Care, Other (non HMO) | Admitting: Internal Medicine

## 2012-01-19 VITALS — BP 138/86 | HR 107 | Temp 98.2°F | Wt 161.0 lb

## 2012-01-19 DIAGNOSIS — N760 Acute vaginitis: Secondary | ICD-10-CM

## 2012-01-19 MED ORDER — FLUCONAZOLE 150 MG PO TABS: 150.0000 mg | ORAL_TABLET | ORAL | Status: AC

## 2012-01-19 MED ORDER — CLOTRIMAZOLE-BETAMETHASONE 1-0.05 % EX CREA: TOPICAL_CREAM | Freq: Two times a day (BID) | CUTANEOUS | Status: DC

## 2012-01-19 NOTE — Progress Notes (Signed)
  Subjective:    Patient ID: Abigail Campos, female    DOB: [birth year], 41 y.o.   MRN: [id number]  HPI Acute visit She was seen in December after she received antibiotics for a respiratory infection, recently she called complaining of a yeast infection on Diflucan was called. At this point, she is on antibiotics again from her dentist and continue having a "cottage cheese" vaginal discharge.  Past Medical History:  Gyn-- Dr Cherly Hensen  G2 P2  Headache - developed migraines after 2nd pregnancy, had a negative CT of the head  Hypertension   Past Surgical History:  Inguinal herniorrhaphy (12/14/2002)  Caesarean section (12/14/1996)  Caesarean section (12/14/2005)  Hysterectomy 12-08 (no oophorectomy per surgical report)   Review of Systems Denies any vaginal bleed, rectal bleeding, abdominal pain. No rash She had some itching in the vulvar area, after she took Diflucan that is better but not completely well.     Objective:   Physical Exam Alert, oriented x3. Patient is somewhat reluctant to have a female exam        Assessment & Plan:

## 2012-01-19 NOTE — Assessment & Plan Note (Signed)
Persistent symptoms consistent with vaginitis, likely triggered by antibiotics. Unfortunately she is still taking antibiotics by mouth. Plan: Will prescribe a cream to be used for 1 week, as soon as she finished her antibiotics she will start Diflucan for 10 days. See instructions. Will call if she is not better.

## 2012-01-19 NOTE — Patient Instructions (Signed)
Start the cream twice a day x at least a week After you finish the antibiotic: start diflucan every other day x 5 doses (10 days)

## 2012-03-15 ENCOUNTER — Encounter: Payer: Self-pay | Admitting: Internal Medicine

## 2012-03-15 ENCOUNTER — Ambulatory Visit (INDEPENDENT_AMBULATORY_CARE_PROVIDER_SITE_OTHER): Payer: Managed Care, Other (non HMO) | Admitting: Internal Medicine

## 2012-03-15 DIAGNOSIS — R079 Chest pain, unspecified: Secondary | ICD-10-CM | POA: Insufficient documentation

## 2012-03-15 DIAGNOSIS — I10 Essential (primary) hypertension: Secondary | ICD-10-CM

## 2012-03-15 MED ORDER — LOSARTAN POTASSIUM 50 MG PO TABS: 50.0000 mg | ORAL_TABLET | Freq: Every day | ORAL | Status: DC

## 2012-03-15 NOTE — Assessment & Plan Note (Signed)
Needs better control. Losartan. Office visit in 2 weeks.

## 2012-03-15 NOTE — Patient Instructions (Signed)
Start losartan, BP goal 120/80. Call if side effects. ------------------ If the chest pain is severe, go to the ER. -------------------- Please get your x-ray at the other [place]  office located at: [address] [place], across from [facility].  Please go to the basement, this is a walk-in facility, they are open from 8:30 to 5:30 PM. Phone number [phone]. --------------------- Will schedule a stress test followup here in 2 weeks

## 2012-03-15 NOTE — Assessment & Plan Note (Signed)
41 year old lady, with a family history of heart disease (reportedly mother was diagnosed with heart issues at age 47), nonsmoker, with high blood pressure apparently not well-controlled who presents with atypical chest pain. She is allergic to ASA. EKG today nsr, no changed from previous  Had a ECHO 3-12, unable to see report Pain is atypical but has a FH and HTN Plan: stress test, labs, XR, see instructions  Recommend to see an eye doctor regards eye sx

## 2012-03-15 NOTE — Progress Notes (Signed)
  Subjective:    Patient ID: Abigail Campos, female    DOB: Mar 21, 1971, 41 y.o.   MRN: [id number]  HPI Acute visit. One week history of on and off chest pain, the pain is in the anterior upper chest, more on the left, no radiation to the neck or shoulder, not associated with nausea or diaphoresis. The pain is described as a pin prick feeling or tingling. It can be with or without exertion, not related with food. She has also noted some white floaters in her vision. Ambulatory BPs not at goal, usually 138-150/107-110   Past Medical History:  Gyn-- Dr Cherly Hensen  G2 P2  Headache - developed migraines after 2nd pregnancy, had a negative CT of the head  Hypertension   Past Surgical History:  Inguinal herniorrhaphy (12/14/2002)  Caesarean section (12/14/1996)  Caesarean section (12/14/2005)  Hysterectomy 12-08 (no oophorectomy per surgical report)  FH CAD-- M dx at age 48, GM, GF CHF--2 uncles, many other family members Stroke,  Grandfather Diabetes, mother, siblings Hypertension, mother, siblings Glaucoma, mother Colon cancer, mother at age 77   Social history:  Married, 2 children  Tobacco use: never  Drug use: no  Alcohol use: no  Exercise: Yes, walks every day  Diet:usually good    Review of Systems No fever or chills No cough or shortness of breath No lower extremity edema or recent airplane trips No GERD. Stress is not particularly high. No diplopia     Objective:   Physical Exam General -- alert, well-developed, and well-nourished. NAD  Neck --no LADs lungs -- normal respiratory effort, no intercostal retractions, no accessory muscle use, and normal breath sounds. Chest wall is nontender to palpation. Heart-- normal rate, regular rhythm, no murmur, and no gallop.   Abdomen--soft, non-tender, no distention, no masses, no HSM, no guarding, and no rigidity.   Extremities-- no pretibial edema bilaterally       Assessment & Plan:

## 2012-03-16 LAB — BASIC METABOLIC PANEL
BUN: 8 mg/dL (ref 6–23)
CO2: 28 mEq/L (ref 19–32)
Calcium: 8.8 mg/dL (ref 8.4–10.5)
Chloride: 102 mEq/L (ref 96–112)
Creatinine, Ser: 0.8 mg/dL (ref 0.4–1.2)
GFR: 109.32 mL/min (ref >60.00)
Glucose, Bld: 94 mg/dL (ref 70–99)
Potassium: 3.7 mEq/L (ref 3.5–5.1)
Sodium: 138 mEq/L (ref 135–145)

## 2012-03-16 LAB — CBC WITH DIFFERENTIAL/PLATELET
Basophils Absolute: 0 10*3/uL (ref 0.0–0.1)
Basophils Relative: 0.6 % (ref 0.0–3.0)
Eosinophils Absolute: 0 10*3/uL (ref 0.0–0.7)
Eosinophils Relative: 0.8 % (ref 0.0–5.0)
HCT: 39.1 % (ref 36.0–46.0)
Hemoglobin: 12.7 g/dL (ref 12.0–15.0)
Lymphocytes Relative: 77.2 % — ABNORMAL HIGH (ref 12.0–46.0)
Lymphs Abs: 1.8 10*3/uL (ref 0.7–4.0)
MCHC: 32.5 g/dL (ref 30.0–36.0)
MCV: 92.1 fl (ref 78.0–100.0)
Monocytes Absolute: 0.3 10*3/uL (ref 0.1–1.0)
Monocytes Relative: 12.6 % — ABNORMAL HIGH (ref 3.0–12.0)
Neutro Abs: 0.2 10*3/uL — ABNORMAL LOW (ref 1.4–7.7)
Neutrophils Relative %: 8.8 % — ABNORMAL LOW (ref 43.0–77.0)
Platelets: 163 10*3/uL (ref 150.0–400.0)
RBC: 4.25 Mil/uL (ref 3.87–5.11)
RDW: 13.5 % (ref 11.5–14.6)
WBC: 2.4 10*3/uL — ABNORMAL LOW (ref 4.5–10.5)

## 2012-03-23 ENCOUNTER — Ambulatory Visit (HOSPITAL_COMMUNITY): Payer: Managed Care, Other (non HMO) | Attending: Cardiology | Admitting: Radiology

## 2012-03-23 DIAGNOSIS — R0609 Other forms of dyspnea: Secondary | ICD-10-CM | POA: Insufficient documentation

## 2012-03-23 DIAGNOSIS — I10 Essential (primary) hypertension: Secondary | ICD-10-CM

## 2012-03-23 DIAGNOSIS — Z8249 Family history of ischemic heart disease and other diseases of the circulatory system: Secondary | ICD-10-CM | POA: Insufficient documentation

## 2012-03-23 DIAGNOSIS — R0989 Other specified symptoms and signs involving the circulatory and respiratory systems: Secondary | ICD-10-CM | POA: Insufficient documentation

## 2012-03-23 DIAGNOSIS — R079 Chest pain, unspecified: Secondary | ICD-10-CM | POA: Insufficient documentation

## 2012-03-23 DIAGNOSIS — R002 Palpitations: Secondary | ICD-10-CM | POA: Insufficient documentation

## 2012-03-23 MED ORDER — TECHNETIUM TC 99M TETROFOSMIN IV KIT
30.0000 | PACK | Freq: Once | INTRAVENOUS | Status: AC | PRN
  Administered 2012-03-23: 30 via INTRAVENOUS

## 2012-03-23 MED ORDER — TECHNETIUM TC 99M TETROFOSMIN IV KIT
10.0000 | PACK | Freq: Once | INTRAVENOUS | Status: AC | PRN
  Administered 2012-03-23: 10 via INTRAVENOUS

## 2012-03-23 NOTE — Progress Notes (Signed)
[facility] SITE 3 NUCLEAR MED [address] [place] [address] [phone]  Cardiology Nuclear Med Study  Abigail Campos is a 41 y.o. female     MRN : [id number]     DOB: [birth year]  Procedure Date: 03/23/2012  Nuclear Med Background Indication for Stress Test:  Evaluation for Ischemia History: 02/2011: ECHO: unable to view report in epic Cardiac Risk Factors: Family History - CAD and Hypertension  Symptoms:  Chest Pain, DOE and Palpitations   Nuclear Pre-Procedure Caffeine/Decaff Intake:  None NPO After: 7:00pm   Lungs:  clear O2 Sat: 99% on room air. IV 0.9% NS with Angio Cath:  20g  IV Site: R Antecubital  IV Started by:  Stanton Kidney, EMT-P  Chest Size (in):  36 Cup Size: B  Height: 4' 11.5" (1.511 m)  Weight:  163 lb (73.936 kg)  BMI:  Body mass index is 32.37 kg/(m^2). Tech Comments:  NA    Nuclear Med Study 1 or 2 day study: 1 day  Stress Test Type:  Stress  Reading MD: [name] Millers, MD  Order Authorizing Provider:  J.Paz  Resting Radionuclide: Technetium 46m Tetrofosmin  Resting Radionuclide Dose: 11.0 mCi   Stress Radionuclide:  Technetium 73m Tetrofosmin  Stress Radionuclide Dose: 33.0 mCi           Stress Protocol Rest HR: 71 Stress HR: 162  Rest BP: 130/80 Stress BP: 211/109  Exercise Time (min): 6:00 METS: 7.0   Predicted Max HR: 182 bpm % Max HR: 89.01 bpm Rate Pressure Product: 41324   Dose of Adenosine (mg):  n/a Dose of Lexiscan: n/a mg  Dose of Atropine (mg): n/a Dose of Dobutamine: n/a mcg/kg/min (at max HR)  Stress Test Technologist: Milana Na, EMT-P  Nuclear Technologist:  Domenic Polite, CNMT     Rest Procedure:  Myocardial perfusion imaging was performed at rest 45 minutes following the intravenous administration of Technetium 80m Tetrofosmin. Rest ECG: NSR - Normal EKG  Stress Procedure:  The patient performed treadmill exercise using a Bruce  Protocol for 6:00 minutes. The patient stopped due to fatigue  and denied any chest pain.  There were non specific ST-T wave changes.  Technetium 21m Tetrofosmin was injected at peak exercise and myocardial perfusion imaging was performed after a brief delay. Stress ECG: No significant ST segment change suggestive of ischemia.  QPS Raw Data Images:  Acquisition technically good; normal left ventricular size. Stress Images:  Normal homogeneous uptake in all areas of the myocardium. Rest Images:  Normal homogeneous uptake in all areas of the myocardium. Subtraction (SDS):  No evidence of ischemia. Transient Ischemic Dilatation (Normal <1.22):  0.89 Lung/Heart Ratio (Normal <0.45):  0.32  Quantitative Gated Spect Images QGS EDV:  76 ml QGS ESV:  24 ml  Impression Exercise Capacity:  Fair exercise capacity. BP Response:  Hypertensive blood pressure response. Clinical Symptoms:  No chest pain or dyspnea. ECG Impression:  No significant ST segment change suggestive of ischemia. Comparison with Prior Nuclear Study: No images to compare  Overall Impression:  Normal stress nuclear study.  LV Ejection Fraction: 69%.  LV Wall Motion:  NL LV Function; NL Wall Motion   [name] Millers

## 2012-03-25 ENCOUNTER — Telehealth: Payer: Self-pay | Admitting: Internal Medicine

## 2012-03-25 NOTE — Progress Notes (Signed)
EKG orders auto-generated in error.   W.Karder Goodin, T-N

## 2012-03-25 NOTE — Telephone Encounter (Signed)
Advised patient, stress test was negative. Her BP was elevated at the time of the test. Plan is the same, need to see her on followup in few  days.

## 2012-03-28 ENCOUNTER — Telehealth: Payer: Self-pay | Admitting: Internal Medicine

## 2012-03-28 NOTE — Telephone Encounter (Signed)
Discussed with pt, scheduled for follow-up 4.19.13.

## 2012-03-30 NOTE — Telephone Encounter (Signed)
Opened in error

## 2012-04-08 ENCOUNTER — Ambulatory Visit (INDEPENDENT_AMBULATORY_CARE_PROVIDER_SITE_OTHER): Payer: Managed Care, Other (non HMO) | Admitting: Internal Medicine

## 2012-04-08 ENCOUNTER — Encounter: Payer: Self-pay | Admitting: Internal Medicine

## 2012-04-08 VITALS — BP 146/82 | HR 66 | Temp 97.9°F | Wt 163.0 lb

## 2012-04-08 DIAGNOSIS — F411 Generalized anxiety disorder: Secondary | ICD-10-CM

## 2012-04-08 DIAGNOSIS — I10 Essential (primary) hypertension: Secondary | ICD-10-CM

## 2012-04-08 DIAGNOSIS — R079 Chest pain, unspecified: Secondary | ICD-10-CM

## 2012-04-08 LAB — BASIC METABOLIC PANEL
BUN: 12 mg/dL (ref 6–23)
CO2: 28 mEq/L (ref 19–32)
Calcium: 9.2 mg/dL (ref 8.4–10.5)
Chloride: 104 mEq/L (ref 96–112)
Creatinine, Ser: 0.8 mg/dL (ref 0.4–1.2)
GFR: 104.51 mL/min (ref >60.00)
Glucose, Bld: 100 mg/dL — ABNORMAL HIGH (ref 70–99)
Potassium: 4 mEq/L (ref 3.5–5.1)
Sodium: 138 mEq/L (ref 135–145)

## 2012-04-08 MED ORDER — ESCITALOPRAM OXALATE 10 MG PO TABS: 10.0000 mg | ORAL_TABLET | Freq: Every day | ORAL | Status: DC

## 2012-04-08 MED ORDER — FLUCONAZOLE 150 MG PO TABS: 150.0000 mg | ORAL_TABLET | Freq: Once | ORAL | Status: AC

## 2012-04-08 NOTE — Assessment & Plan Note (Signed)
BPs reportedly better than today's 146/82, no   side effects. Plan: No change, check a BMP

## 2012-04-08 NOTE — Assessment & Plan Note (Signed)
Reports anxiety, almost daily. In the past she did some counseling which helped . We discussed SSRIs vs.as needed clonazepam. She feels that SSRI could work  better.  Plan: Trial with Lexapro follow up in 6 weeks, side effects discussed

## 2012-04-08 NOTE — Progress Notes (Signed)
  Subjective:    Patient ID: Abigail Campos, female    DOB: 1971-03-30, 41 y.o.   MRN: [id number]  HPI Followup from previous visit The stress test was negative, she still has occasional chest pain, she now realizes that is related to anxiety, chest pain goes away when she is able to relax. Good compliance with BP meds, BP today 146/82, ambulatory BP is usually less than 120.  Past Medical History:  Gyn-- Dr Cherly Hensen  G2 P2  Headache - developed migraines after 2nd pregnancy, had a negative CT of the head  Hypertension  Past Surgical History:  Inguinal herniorrhaphy (12/14/2002)  Caesarean section (12/14/1996)  Caesarean section (12/14/2005)  Hysterectomy 12-08 (no oophorectomy per surgical report)  FH  CAD-- M dx at age 81, GM, GF  CHF--2 uncles, many other family members  Stroke, Grandfather  Diabetes, mother, siblings  Hypertension, mother, siblings  Glaucoma, mother  Colon cancer, mother at age 64  Social history:  Married, 2 children  Tobacco use: never  Drug use: no  Alcohol use: no     Review of Systems Complaining of a yeast infection, see nurse's notes. Denies any vaginal discharge, just mild itching. No fever or chills. Denies any coughsince she started taking losartan. No suicidal    Objective:   Physical Exam  General -- alert, well-developed, and slt overweight appearing. No apparent distress.  Lungs -- normal respiratory effort, no intercostal retractions, no accessory muscle use, and normal breath sounds.   Heart-- normal rate, regular rhythm, no murmur, and no gallop.   extremities-- no pretibial edema bilaterally  Psych-- Cognition and judgment appear intact. Alert and cooperative with normal attention span and concentration.  Anxious-tearful when we talk about the her motions, not depressed appearing.       Assessment & Plan:  Possibly yeast infection, Diflucan x2

## 2012-04-08 NOTE — Assessment & Plan Note (Signed)
Stress tst  was negative, see history of present illness--->  thinks symptoms are related to anxiety.  Plan: Treat anxiety.

## 2012-04-12 ENCOUNTER — Encounter: Payer: Self-pay | Admitting: *Deleted

## 2012-05-16 ENCOUNTER — Ambulatory Visit (INDEPENDENT_AMBULATORY_CARE_PROVIDER_SITE_OTHER): Payer: Managed Care, Other (non HMO) | Admitting: Internal Medicine

## 2012-05-16 VITALS — BP 128/88 | HR 60 | Temp 98.0°F | Wt 166.0 lb

## 2012-05-16 DIAGNOSIS — I10 Essential (primary) hypertension: Secondary | ICD-10-CM

## 2012-05-16 DIAGNOSIS — F411 Generalized anxiety disorder: Secondary | ICD-10-CM

## 2012-05-16 MED ORDER — ESCITALOPRAM OXALATE 10 MG PO TABS: 20.0000 mg | ORAL_TABLET | Freq: Every day | ORAL | Status: DC

## 2012-05-16 NOTE — Assessment & Plan Note (Addendum)
Started Lexapro, good compliance and tolerance, symptoms not completely well-controlled, see history of present illness. Still has some insomnia. Plan: Increase Lexapro to 20 mg, watch for side effects, reassess in 2 months. She does need to see a counselor, see instructions.

## 2012-05-16 NOTE — Assessment & Plan Note (Addendum)
Well-controlled on  Plendil and Cozaar. She discontinue bystolic ~ 2 months ago

## 2012-05-16 NOTE — Patient Instructions (Signed)
Increase Lexapro to 2 tablets daily, call if you don't see any benefit in the next few weeks. Please see a counselor, Mrs. Berniece Andreas works in this office, she could be a Marketing executive for you Come back in 2 months

## 2012-05-16 NOTE — Progress Notes (Signed)
  Subjective:    Patient ID: Abigail Campos, female    DOB: [birth year], 41 y.o.   MRN: [id number]  HPI Routine office visit Good compliance with Lexapro, she felt "spacey" the first few days after she started but then sx  went away. Anxiety has decreased to some extent but she still gets anxious 1 hour after she arrives to work.  Past Medical History:  Gyn-- Dr Cherly Hensen  G2 P2  Headache - developed migraines after 2nd pregnancy, had a negative CT of the head  Hypertension  Past Surgical History:  Inguinal herniorrhaphy (12/14/2002)  Caesarean section (12/14/1996)  Caesarean section (12/14/2005)  Hysterectomy 12-08 (no oophorectomy per surgical report)  FH  CAD-- M dx at age 40, GM, GF  CHF--2 uncles, many other family members  Stroke, Grandfather  Diabetes, mother, siblings  Hypertension, mother, siblings  Glaucoma, mother  Colon cancer, mother at age 74  Social history:  Married, 2 children  Tobacco use: never  Drug use: no  Alcohol use: no    Review of Systems No  nausea, vomiting or diarrhea. Chest pain is much less frequent than before. Still has occasional insomnia. BP meds--->  off bystolic  for several weeks.    Objective:   Physical Exam Alert oriented, no apparent distress. Vital signs stable. No anxiety or depression.     Assessment & Plan:

## 2012-05-17 ENCOUNTER — Encounter: Payer: Self-pay | Admitting: Internal Medicine

## 2012-05-20 ENCOUNTER — Ambulatory Visit: Payer: Managed Care, Other (non HMO) | Admitting: Internal Medicine

## 2012-08-16 ENCOUNTER — Telehealth: Payer: Self-pay | Admitting: Internal Medicine

## 2012-08-16 NOTE — Telephone Encounter (Signed)
Please advise 

## 2012-08-16 NOTE — Telephone Encounter (Signed)
The patient called and is hoping to be seen for itching due to taking hydrocodone (after getting her wisdom teeth taken out). Do you want her added onto your schedule for tomorrow?

## 2012-08-16 NOTE — Telephone Encounter (Signed)
If symptoms severe, rash, difficulty breathing: Go to the ER Otherwise okay to add her on for tomorrow

## 2012-08-16 NOTE — Telephone Encounter (Signed)
Discussed with pt, added her on to tomorrow's schedule.

## 2012-08-17 ENCOUNTER — Encounter: Payer: Self-pay | Admitting: Internal Medicine

## 2012-08-17 ENCOUNTER — Ambulatory Visit (INDEPENDENT_AMBULATORY_CARE_PROVIDER_SITE_OTHER): Payer: Managed Care, Other (non HMO) | Admitting: Internal Medicine

## 2012-08-17 VITALS — BP 142/90 | HR 64 | Temp 98.1°F | Wt 166.0 lb

## 2012-08-17 DIAGNOSIS — T7840XA Allergy, unspecified, initial encounter: Secondary | ICD-10-CM

## 2012-08-17 MED ORDER — PREDNISONE 10 MG PO TABS: ORAL_TABLET | ORAL | Status: DC

## 2012-08-17 NOTE — Progress Notes (Signed)
  Subjective:    Patient ID: Abigail Campos, female    DOB: December 31, 1970, 41 y.o.   MRN: [id number]  HPI Acute visit 6 days ago, went to a dentist, was prescribed hydrocodone, she took 4 tablets daily for 4 days. Few hours after initiation of hydrocodone she developed generalized itching, severe, she stopped hydrocodone about 36 hours ago and is still itching some.  Past Medical History:   Gyn-- Dr Cherly Hensen   G2 P2   Headache - developed migraines after 2nd pregnancy, had a negative CT of the head   Hypertension   Past Surgical History:   Inguinal herniorrhaphy (12/14/2002)   Caesarean section (12/14/1996)   Caesarean section (12/14/2005)   Hysterectomy 12-08 (no oophorectomy per surgical report)   FH  CAD-- M dx at age 51, GM, GF   CHF--2 uncles, many other family members   Stroke, Grandfather   Diabetes, mother, siblings   Hypertension, mother, siblings   Glaucoma, mother   Colon cancer, mother at age 43   Social history:  Married, 2 children   Tobacco use: never   Drug use: no   Alcohol use: no    Review of Systems Denies fever or chills No lip or tongue swelling No shortness or breath or wheezing No rash At this point, dental pain is mild.    Objective:   Physical Exam  General -- alert, well-developed, no distress but she is scratching frequently HEENT -- lips and tongue normal to inspection Lungs -- normal respiratory effort, no intercostal retractions, no accessory muscle use, and normal breath sounds.   Heart-- normal rate, regular rhythm, no murmur, and no gallop.   extremities-- no pretibial edema bilaterally Psych-- Cognition and judgment appear intact. Alert and cooperative with normal attention span and concentration.  not anxious appearing and not depressed appearing.       Assessment & Plan:   Reaction to hydrocodone, no systemic symptoms but  itching is generalized Recommend: Avoidance, prednisone and Benadryl. See instructions.

## 2012-08-17 NOTE — Patient Instructions (Addendum)
From this point on  avoid hydrocodone (Vicodin) Take prednisone as prescribed  Also take Benadryl over-the-counter 12.5 mg one tablet 2 or 3 times a day as needed for itching Call if you're not improving in few days, call anytime if symptoms severe or you have a rash For pain you can use Tylenol as needed

## 2012-10-15 ENCOUNTER — Emergency Department (HOSPITAL_BASED_OUTPATIENT_CLINIC_OR_DEPARTMENT_OTHER)
Admission: EM | Admit: 2012-10-15 | Discharge: 2012-10-15 | Disposition: A | Discharge status: Home / Self Care | Payer: Managed Care, Other (non HMO) | Attending: Emergency Medicine | Admitting: Emergency Medicine

## 2012-10-15 ENCOUNTER — Encounter (HOSPITAL_BASED_OUTPATIENT_CLINIC_OR_DEPARTMENT_OTHER): Payer: Self-pay | Admitting: Emergency Medicine

## 2012-10-15 DIAGNOSIS — Z888 Allergy status to other drugs, medicaments and biological substances status: Secondary | ICD-10-CM | POA: Insufficient documentation

## 2012-10-15 DIAGNOSIS — Z91010 Allergy to peanuts: Secondary | ICD-10-CM | POA: Insufficient documentation

## 2012-10-15 DIAGNOSIS — Z833 Family history of diabetes mellitus: Secondary | ICD-10-CM | POA: Insufficient documentation

## 2012-10-15 DIAGNOSIS — Z823 Family history of stroke: Secondary | ICD-10-CM | POA: Insufficient documentation

## 2012-10-15 DIAGNOSIS — Z91040 Latex allergy status: Secondary | ICD-10-CM | POA: Insufficient documentation

## 2012-10-15 DIAGNOSIS — I10 Essential (primary) hypertension: Secondary | ICD-10-CM | POA: Insufficient documentation

## 2012-10-15 DIAGNOSIS — Z79899 Other long term (current) drug therapy: Secondary | ICD-10-CM | POA: Insufficient documentation

## 2012-10-15 DIAGNOSIS — D649 Anemia, unspecified: Secondary | ICD-10-CM | POA: Insufficient documentation

## 2012-10-15 MED ORDER — HYDROCHLOROTHIAZIDE 25 MG PO TABS: 25.0000 mg | ORAL_TABLET | Freq: Every day | ORAL | Status: DC

## 2012-10-15 NOTE — ED Notes (Signed)
Pt having headache for several days.  Pt having some nausea this am.  Pt states her BP has been elevated recently.  Pt compliant with medications.  BP has been fluctuating, lowest systolic 130's.  Pt denies chest pain, no dizziness or other symptoms.

## 2012-10-15 NOTE — ED Provider Notes (Signed)
History     [name]: [id number]  Arrival date & time 10/15/12  1153   First MD Initiated Contact with Patient 10/15/12 1257      Chief Complaint  Patient presents with  . Headache  . Hypertension    (Consider location/radiation/quality/duration/timing/severity/associated sxs/prior treatment) Patient is a 41 y.o. female presenting with hypertension. The history is provided by the patient. No language interpreter was used.  Hypertension This is a new problem. The current episode started today. The problem occurs constantly. The problem has been gradually worsening. Associated symptoms include nausea. Nothing aggravates the symptoms. She has tried nothing for the symptoms. The treatment provided no relief.  Pt complains of high blood pressure.  Pt reports she thinks she needs to be on different medication.   Pt reports she has had a headache on and off.  Past Medical History  Diagnosis Date  . Headache     developed migraines after 2nd pregnancy, had a negative CT of the head  . Anemia     NOS  . Hypertension     Past Surgical History  Procedure Date  . Inguinal hernia repair   . Cesarean section     [id number]  . Abdominal hysterectomy 11-14-07    on oophorectomy per surgical report    Family History  Problem Relation Age of Onset  . Diabetes Mother   . Hypertension Mother   . Glaucoma Mother   . Heart disease      2 uncles died Jun 07, 2011 from CAD-CHF  . Stroke Maternal Grandfather   . Colon cancer      mother age 62  . Diabetes Other     siblings  . Hypertension Other     siblings    History  Substance Use Topics  . Smoking status: Never Smoker   . Smokeless tobacco: Never Used  . Alcohol Use: No    OB History    Grav Para Term Preterm Abortions TAB SAB Ect Mult Living   2 2              Review of Systems  Gastrointestinal: Positive for nausea.  All other systems reviewed and are negative.    Allergies  Hydrocodone; Aspirin; Latex; and Peanut-containing  drug products  Home Medications   Current Outpatient Rx  Name Route Sig Dispense Refill  . FELODIPINE ER 10 MG PO TB24 Oral Take 10 mg by mouth daily.    Marland Kitchen LOSARTAN POTASSIUM 50 MG PO TABS Oral Take 1 tablet (50 mg total) by mouth daily. 30 tablet 3  . ONE-DAILY MULTI VITAMINS PO TABS Oral Take 1 tablet by mouth 2 (two) times daily.        BP 168/105  Pulse 89  Temp 99.1 F (37.3 C)  Resp 18  Ht 4\' 11"  (1.499 m)  Wt 170 lb (77.111 kg)  BMI 34.34 kg/m2  SpO2 100%  Physical Exam  Nursing note and vitals reviewed. Constitutional: She appears well-developed and well-nourished.  HENT:  Head: Normocephalic and atraumatic.  Eyes: Pupils are equal, round, and reactive to light.  Neck: Normal range of motion. Neck supple.  Cardiovascular: Normal rate and normal heart sounds.   Pulmonary/Chest: Effort normal.  Abdominal: Soft.  Musculoskeletal: Normal range of motion.  Neurological: She is alert.  Skin: Skin is warm.    ED Course  Procedures (including critical care time)  Date: 10/15/2012  Rate: 79  Rhythm: normal sinus rhythm  QRS Axis: normal  Intervals: normal  ST/T Wave abnormalities: normal  Conduction Disutrbances:none  Narrative Interpretation:   Old EKG Reviewed: none available  Labs Reviewed - No data to display No results found.   No diagnosis found.    MDM  Pt given rx for hctz.   Pt advised to see her Md on Monday for recheck        Lonia Skinner [place], [place] 10/15/12 1518

## 2012-10-16 NOTE — ED Provider Notes (Signed)
Medical screening examination/treatment/procedure(s) were performed by non-physician practitioner and as supervising physician I was immediately available for consultation/collaboration.  Ethelda Chick, MD 10/16/12 [phone]

## 2012-10-19 ENCOUNTER — Ambulatory Visit (INDEPENDENT_AMBULATORY_CARE_PROVIDER_SITE_OTHER): Payer: Managed Care, Other (non HMO) | Admitting: Internal Medicine

## 2012-10-19 ENCOUNTER — Encounter: Payer: Self-pay | Admitting: Internal Medicine

## 2012-10-19 VITALS — BP 130/88 | HR 80 | Temp 98.2°F | Wt 161.0 lb

## 2012-10-19 DIAGNOSIS — I10 Essential (primary) hypertension: Secondary | ICD-10-CM

## 2012-10-19 DIAGNOSIS — R51 Headache: Secondary | ICD-10-CM

## 2012-10-19 MED ORDER — LOSARTAN POTASSIUM-HCTZ 100-12.5 MG PO TABS: 1.0000 | ORAL_TABLET | Freq: Every day | ORAL | Status: DC

## 2012-10-19 NOTE — Assessment & Plan Note (Addendum)
Was diagnosed with hypertension at age 41, has a strong family history of hypertension including father, mother and many other family members. Currently byastolic, felodipine and losartan, they just added HCTZ 25 mg at the ER because BP was elevated. CT abdomen 2008 showed unremarkable kidneys. Most likely this is a difficult to control primary hypertension. Complains of urinating too frequently with HCTZ, will reduce her dose Plan: Continue Plendil Continue bystolic Consolidate losartan 50 and HCTZ 25 on ----->  Hyzaar 100-12.5 BMP in 2 weeks See instructions

## 2012-10-19 NOTE — Progress Notes (Signed)
  Subjective:    Patient ID: Abigail Campos, female    DOB: 06/24/1971, 41 y.o.   MRN: [id number]  HPI Emergency room for now. Went to the ER 10/15/2012 complaining of headaches and elevated BP. She self discontinue caffeine beverages before the ER visit, shortly after she developed headaches associated with some dizziness and nausea. Because the headaches she checked her BP and it was elevated. At the ER , BP was confirmed to be high (168/105), no labs or x-rays were done, EKG showed NSR,  she was prescribed HCTZ which she is taking. Since then, she is drinking a very small amount of caffeine and had no further headaches. She's taking HZTZ but reports that she is going to the bathroom very frequently and would like something different.  Past Medical History  Diagnosis Date  . Headache     developed migraines after 2nd pregnancy, had a negative CT of the head  . Anemia     NOS  . Hypertension     Past Surgical History:  Inguinal herniorrhaphy (12/14/2002)  Caesarean section (12/14/1996)  Caesarean section (12/14/2005)  Hysterectomy 12-08 (no oophorectomy per surgical report)  FH  CAD-- M dx at age 46, GM, GF  CHF--2 uncles, many other family members  Stroke, Grandfather  Diabetes, mother, siblings  Hypertension, mother, siblings  Glaucoma, mother  Colon cancer, mother at age 70  Social history:  Married, 2 children  Tobacco use: never  Drug use: no  Alcohol use: no  Gyn-- Dr Cherly Hensen  G2 P2   Review of Systems Her diet has definitely improved, she is losing some wt. Denies excessive salt intake, not taking any NSAIDs. Denies chest pain, shortness of breath or lower extremity edema    Objective:   Physical Exam  General -- alert, well-developed Lungs -- normal respiratory effort, no intercostal retractions, no accessory muscle use, and normal breath sounds.   Heart-- normal rate, regular rhythm, no murmur, and no gallop.   Abdomen--soft, non-tender, no  distention, no masses, no HSM, no guarding, and no rigidity. No bruit.  Extremities-- no pretibial edema bilaterally  Psych-- Cognition and judgment appear intact. Alert and cooperative with normal attention span and concentration.  not anxious appearing and not depressed appearing.      Assessment & Plan:

## 2012-10-19 NOTE — Assessment & Plan Note (Signed)
See history of present illness, had headaches after she completes stopped caffeine, now drinking  a modest amount of caffeine and she is headache free. Plans to gradually continue decreasing caffeine

## 2012-10-19 NOTE — Patient Instructions (Addendum)
Continue Plendil Continue bystolic Consolidate losartan 50 and HCTZ 25 on ----->  Hyzaar 100-12.5 1 tablet  a day Schedule labs in 2 weeks: BMP-- dx HTN Check the  blood pressure 2 or 3 times a week, be sure it is between 110/60 and 140/85. If it is consistently higher or lower, let me know Next office visit in 3 months

## 2012-11-02 ENCOUNTER — Other Ambulatory Visit (INDEPENDENT_AMBULATORY_CARE_PROVIDER_SITE_OTHER): Payer: Managed Care, Other (non HMO)

## 2012-11-02 DIAGNOSIS — I10 Essential (primary) hypertension: Secondary | ICD-10-CM

## 2012-11-02 LAB — BASIC METABOLIC PANEL
BUN: 14 mg/dL (ref 6–23)
CO2: 29 mEq/L (ref 19–32)
Calcium: 9.2 mg/dL (ref 8.4–10.5)
Chloride: 100 mEq/L (ref 96–112)
Creatinine, Ser: 0.9 mg/dL (ref 0.4–1.2)
GFR: 88.51 mL/min (ref >60.00)
Glucose, Bld: 102 mg/dL — ABNORMAL HIGH (ref 70–99)
Potassium: 3.6 mEq/L (ref 3.5–5.1)
Sodium: 134 mEq/L — ABNORMAL LOW (ref 135–145)

## 2012-11-07 ENCOUNTER — Encounter: Payer: Self-pay | Admitting: *Deleted

## 2012-12-14 LAB — HM MAMMOGRAPHY

## 2012-12-14 LAB — HM PAP SMEAR

## 2012-12-28 ENCOUNTER — Ambulatory Visit (INDEPENDENT_AMBULATORY_CARE_PROVIDER_SITE_OTHER): Payer: Managed Care, Other (non HMO) | Admitting: Internal Medicine

## 2012-12-28 ENCOUNTER — Encounter: Payer: Self-pay | Admitting: Internal Medicine

## 2012-12-28 VITALS — BP 132/86 | HR 89 | Temp 98.1°F | Wt 160.0 lb

## 2012-12-28 DIAGNOSIS — R51 Headache: Secondary | ICD-10-CM

## 2012-12-28 DIAGNOSIS — F411 Generalized anxiety disorder: Secondary | ICD-10-CM

## 2012-12-28 DIAGNOSIS — G4700 Insomnia, unspecified: Secondary | ICD-10-CM

## 2012-12-28 DIAGNOSIS — I10 Essential (primary) hypertension: Secondary | ICD-10-CM

## 2012-12-28 MED ORDER — ZOLPIDEM TARTRATE 10 MG PO TABS: 10.0000 mg | ORAL_TABLET | Freq: Every evening | ORAL | Status: DC | PRN

## 2012-12-28 NOTE — Assessment & Plan Note (Signed)
Well controlled with current regimen, ambulatory diastolic BP slightly higher  than ideal (87). Plan: Refill meds, no change for now. Continue losing weight (see review of systems)

## 2012-12-28 NOTE — Progress Notes (Signed)
  Subjective:    Patient ID: Abigail Campos, female    DOB: 06/02/1971, 42 y.o.   MRN: [id number]  HPI  Acute visit Reports 3 weeks history of difficulty sleeping, mostly she is unable to stay asleep. She try her mother's Ambien few times and he worked really well for her. Requests a prescription.  Past Medical History  Diagnosis Date  . Headache     developed migraines after 2nd pregnancy, had a negative CT of the head  . Hypertension     Past Surgical History  Procedure Date  . Inguinal hernia repair     2004  . Cesarean section     [id number]  . Abdominal hysterectomy 11-14-07    on oophorectomy per surgical report     Review of Systems She does not watch TV late at night. Denies anxiety or depression. Good compliance with BP meds, ambulatory blood pressure in the 120s/85. Denies any headaches. Not taking any OTC or herbal meds  that could account for insomnia. Doing well with diet and exercise, has lost 4 pounds in the last couple of weeks    Objective:   Physical Exam General -- alert, well-developed Lungs -- normal respiratory effort, no intercostal retractions, no accessory muscle use, and normal breath sounds.   Heart-- normal rate, regular rhythm, no murmur, and no gallop.   Extremities-- no pretibial edema bilaterally Psych-- Cognition and judgment appear intact. Alert and cooperative with normal attention span and concentration.  not anxious appearing and not depressed appearing.        Assessment & Plan:

## 2012-12-28 NOTE — Assessment & Plan Note (Signed)
Had anxiety at some point, currently on no medications and doing great. Problem resolved.

## 2012-12-28 NOTE — Assessment & Plan Note (Signed)
Resolved

## 2012-12-28 NOTE — Assessment & Plan Note (Signed)
Some difficulty with sleeping in the last few weeks, healthy sleep habits discussed, information provided. Okay to provide Ambien to use as needed.

## 2012-12-29 ENCOUNTER — Encounter: Payer: Self-pay | Admitting: Internal Medicine

## 2013-01-12 ENCOUNTER — Ambulatory Visit: Payer: Managed Care, Other (non HMO) | Admitting: Internal Medicine

## 2013-01-13 ENCOUNTER — Ambulatory Visit (INDEPENDENT_AMBULATORY_CARE_PROVIDER_SITE_OTHER): Payer: Managed Care, Other (non HMO) | Admitting: Internal Medicine

## 2013-01-13 VITALS — BP 108/72 | HR 75 | Temp 98.1°F | Wt 156.0 lb

## 2013-01-13 DIAGNOSIS — K047 Periapical abscess without sinus: Secondary | ICD-10-CM

## 2013-01-13 DIAGNOSIS — K044 Acute apical periodontitis of pulpal origin: Secondary | ICD-10-CM

## 2013-01-13 MED ORDER — AMOXICILLIN-POT CLAVULANATE 875-125 MG PO TABS: 1.0000 | ORAL_TABLET | Freq: Two times a day (BID) | ORAL | Status: DC

## 2013-01-13 MED ORDER — METRONIDAZOLE 500 MG PO TABS: 500.0000 mg | ORAL_TABLET | Freq: Three times a day (TID) | ORAL | Status: DC

## 2013-01-13 NOTE — Patient Instructions (Addendum)
Take antibiotics as prescribed. Tylenol for pain. If you get worse, have more swelling, difficulty breathing:  need to see the dentist immediately

## 2013-01-13 NOTE — Progress Notes (Signed)
  Subjective:    Patient ID: Abigail Campos, female    DOB: [birth year], 42 y.o.   MRN: [id number]  HPI Acute visit Few days ago developed what she thought was a virus: Runny nose, chills, fever. She is better. Today however, her main concern is a odontogenic infection. She is in need of a dental extraction which is a schedule for next month however 2 days ago developed pain and swelling in the area.  Past Medical History  Diagnosis Date  . Headache     developed migraines after 2nd pregnancy, had a negative CT of the head  . Hypertension    Past Surgical History  Procedure Date  . Inguinal hernia repair     2004  . Cesarean section     [id number]  . Abdominal hysterectomy 11-14-07    on oophorectomy per surgical report     Review of Systems Other than mild runny nose, most of the viral symptoms are gone. Denies any cough. No difficulty swallowing or breathing.    Objective:   Physical Exam  General -- alert, well-developed  Neck - no mass or asymmetry HEENT --  dentition in general in poor condition, molars at the right  jaw: Area is swollen, tender to palpation, no  obvious abscess that I can tell. No trismus or difficulty swallowing  Neurologic-- alert & oriented X3 and strength normal in all extremities. Psych-- Cognition and judgment appear intact. Alert and cooperative with normal attention span and concentration.  not anxious appearing and not depressed appearing.      Assessment & Plan:  Odontogenic infection, Pain control with Tylenol, she is allergic to hydrocodone aspirin. RX Augmentin and Flagyl; is intolerant, consider  change Flagyl to clindamycin. See instructions

## 2013-01-15 ENCOUNTER — Encounter: Payer: Self-pay | Admitting: Internal Medicine

## 2013-03-10 ENCOUNTER — Ambulatory Visit (INDEPENDENT_AMBULATORY_CARE_PROVIDER_SITE_OTHER): Payer: Managed Care, Other (non HMO) | Admitting: Internal Medicine

## 2013-03-10 VITALS — BP 136/84 | HR 72 | Temp 97.9°F | Wt 161.0 lb

## 2013-03-10 DIAGNOSIS — I10 Essential (primary) hypertension: Secondary | ICD-10-CM

## 2013-03-10 LAB — CBC WITH DIFFERENTIAL/PLATELET
Basophils Absolute: 0 10*3/uL (ref 0.0–0.1)
Basophils Relative: 0 % (ref 0–1)
Eosinophils Absolute: 0.1 10*3/uL (ref 0.0–0.7)
Eosinophils Relative: 1 % (ref 0–5)
HCT: 37.2 % (ref 36.0–46.0)
Hemoglobin: 11.9 g/dL — ABNORMAL LOW (ref 12.0–15.0)
Lymphocytes Relative: 41 % (ref 12–46)
Lymphs Abs: 2.1 10*3/uL (ref 0.7–4.0)
MCH: 29.5 pg (ref 26.0–34.0)
MCHC: 32 g/dL (ref 30.0–36.0)
MCV: 92.1 fL (ref 78.0–100.0)
Monocytes Absolute: 0.4 10*3/uL (ref 0.1–1.0)
Monocytes Relative: 8 % (ref 3–12)
Neutro Abs: 2.6 10*3/uL (ref 1.7–7.7)
Neutrophils Relative %: 50 % (ref 43–77)
Platelets: 239 10*3/uL (ref 150–400)
RBC: 4.04 MIL/uL (ref 3.87–5.11)
RDW: 13.5 % (ref 11.5–15.5)
WBC: 5.1 10*3/uL (ref 4.0–10.5)

## 2013-03-10 LAB — BASIC METABOLIC PANEL
BUN: 9 mg/dL (ref 6–23)
CO2: 30 mEq/L (ref 19–32)
Calcium: 9 mg/dL (ref 8.4–10.5)
Chloride: 103 mEq/L (ref 96–112)
Creat: 0.73 mg/dL (ref 0.50–1.10)
Glucose, Bld: 94 mg/dL (ref 70–99)
Potassium: 3.6 mEq/L (ref 3.5–5.3)
Sodium: 138 mEq/L (ref 135–145)

## 2013-03-10 MED ORDER — LOSARTAN POTASSIUM-HCTZ 100-12.5 MG PO TABS: 1.0000 | ORAL_TABLET | Freq: Every day | ORAL | Status: DC

## 2013-03-10 MED ORDER — NEBIVOLOL HCL 10 MG PO TABS: 10.0000 mg | ORAL_TABLET | Freq: Every day | ORAL | Status: DC

## 2013-03-10 MED ORDER — ZOLPIDEM TARTRATE 10 MG PO TABS: 10.0000 mg | ORAL_TABLET | Freq: Every evening | ORAL | Status: DC | PRN

## 2013-03-10 MED ORDER — FELODIPINE ER 10 MG PO TB24: 10.0000 mg | ORAL_TABLET | Freq: Every day | ORAL | Status: DC

## 2013-03-10 NOTE — Progress Notes (Signed)
  Subjective:    Patient ID: Abigail Campos, female    DOB: [birth year], 42 y.o.   MRN: [id number]  HPI Routine office visit Hypertension, good medication compliance, she checks her BPs frequently, usually in the 130/80 however last week it  was elevated for a complete day, she was feeling well that particular day. Has improved her diet and is eating much healthier  Past Medical History  Diagnosis Date  . Headache     developed migraines after 2nd pregnancy, had a negative CT of the head  . Hypertension    Past Surgical History  Procedure Laterality Date  . Inguinal hernia repair      2004  . Cesarean section      [id number]  . Abdominal hysterectomy  11-14-07    on oophorectomy per surgical report   History   Social History  . Marital Status: Married    Spouse Name: N/A    Number of Children: N/A  . Years of Education: N/A   Occupational History  . Not on file.   Social History Main Topics  . Smoking status: Never Smoker   . Smokeless tobacco: Never Used  . Alcohol Use: No  . Drug Use: No  . Sexually Active: Not on file   Other Topics Concern  . 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 No lower extremity edema. When asked, admits to snoring but denies feeling sleepy throughout the day it or fatigue.     Objective:   Physical Exam General -- alert, well-developed, NAD .   Lungs -- normal respiratory effort, no intercostal retractions, no accessory muscle use, and normal breath sounds.  Heart-- normal rate, regular rhythm, no murmur, and no gallop.   Extremities-- no pretibial edema bilaterally  Neurologic-- alert & oriented X3 and strength normal in all extremities. Psych-- Cognition and judgment appear intact. Alert and cooperative with normal attention span and concentration.  not anxious appearing and not depressed appearing.      Assessment & Plan:

## 2013-03-10 NOTE — Patient Instructions (Addendum)
Check the  blood pressure 5-6 times a week, be sure it is between 110/60 and 140/85. If it is consistently higher or lower, let me know

## 2013-03-10 NOTE — Assessment & Plan Note (Signed)
Seems well controlled, BP was elevated one day last week however otherwise is normal. Plan is to continue taking the same medications, check a BMP and CBC, keep an eye on her BP (see instructions)

## 2013-03-11 ENCOUNTER — Encounter: Payer: Self-pay | Admitting: Internal Medicine

## 2013-03-13 ENCOUNTER — Encounter: Payer: Self-pay | Admitting: *Deleted

## 2013-04-04 ENCOUNTER — Telehealth: Payer: Self-pay | Admitting: Internal Medicine

## 2013-04-04 NOTE — Telephone Encounter (Signed)
To see Dr Laury Axon tomorrow

## 2013-04-04 NOTE — Telephone Encounter (Signed)
Appointment Scheduled:  04/05/2013 11:30:00  Appointment Scheduled Provider:  Lelon Perla

## 2013-04-04 NOTE — Telephone Encounter (Signed)
Patient Information:  Caller Name: [place]  Phone: [phone]  Patient: Abigail, Campos  Gender: Female  DOB: [birth year]  Age: 42 Years  PCP: Willow Ora  Pregnant: No  Office Follow Up:  Does the office need to follow up with this patient?: No  Instructions For The Office: N/A  RN Note:  Onset of sore throat 014/18/14; states has not improved after 72 hours of home care.  States she is able to open her mouth completely.  Afebrile.  Patient would like to be seen for possible strep testing.  Per sore throat protocol, advised appt; no appts available 04/04/13.  Appt scheduled 04/05/13 1130 with Dr. Laury Axon.  krs/can  Symptoms  Reason For Call & Symptoms: sore throat  Reviewed Health History In EMR: Yes  Reviewed Medications In EMR: Yes  Reviewed Allergies In EMR: Yes  Reviewed Surgeries / Procedures: Yes  Date of Onset of Symptoms: 03/31/2013 OB / GYN:  LMP: Unknown  Guideline(s) Used:  Sore Throat  Disposition Per Guideline:   See Today in Office  Reason For Disposition Reached:   Patient wants to be seen  Advice Given:  N/A  Patient Will Follow Care Advice:  YES  Appointment Scheduled:  04/05/2013 11:30:00 Appointment Scheduled Provider:  Lelon Perla.

## 2013-04-05 ENCOUNTER — Encounter: Payer: Self-pay | Admitting: Lab

## 2013-04-05 ENCOUNTER — Ambulatory Visit (INDEPENDENT_AMBULATORY_CARE_PROVIDER_SITE_OTHER): Payer: Managed Care, Other (non HMO) | Admitting: Family Medicine

## 2013-04-05 ENCOUNTER — Encounter: Payer: Self-pay | Admitting: Family Medicine

## 2013-04-05 VITALS — BP 142/86 | HR 65 | Temp 98.3°F | Wt 162.4 lb

## 2013-04-05 DIAGNOSIS — J029 Acute pharyngitis, unspecified: Secondary | ICD-10-CM

## 2013-04-05 NOTE — Patient Instructions (Signed)
Viral and Bacterial Pharyngitis Pharyngitis is soreness (inflammation) or infection of the pharynx. It is also called a sore throat. CAUSES   Most sore throats are caused by viruses and are part of a cold. However, some sore throats are caused by strep and other bacteria. Sore throats can also be caused by post nasal drip from draining sinuses, allergies and sometimes from sleeping with an open mouth. Infectious sore throats can be spread from person to person by coughing, sneezing and sharing cups or eating utensils. TREATMENT   Sore throats that are viral usually last 3-4 days. Viral illness will get better without medications (antibiotics). Strep throat and other bacterial infections will usually begin to get better about 24-48 hours after you begin to take antibiotics. HOME CARE INSTRUCTIONS    If the caregiver feels there is a bacterial infection or if there is a positive strep test, they will prescribe an antibiotic. The full course of antibiotics must be taken. If the full course of antibiotic is not taken, you or your child may become ill again. If you or your child has strep throat and do not finish all of the medication, serious heart or kidney diseases may develop.   Drink enough water and fluids to keep your urine clear or pale yellow.   Only take over-the-counter or prescription medicines for pain, discomfort or fever as directed by your caregiver.   Get lots of rest.   Gargle with salt water ( tsp. of salt in a glass of water) as often as every 1-2 hours as you need for comfort.   Hard candies may soothe the throat if individual is not at risk for choking. Throat sprays or lozenges may also be used.  SEEK MEDICAL CARE IF:    Large, tender lumps in the neck develop.   A rash develops.   Green, yellow-brown or bloody sputum is coughed up.   Your baby is older than 3 months with a rectal temperature of 100.5 F (38.1 C) or higher for more than 1 day.  SEEK IMMEDIATE MEDICAL  CARE IF:    A stiff neck develops.   You or your child are drooling or unable to swallow liquids.   You or your child are vomiting, unable to keep medications or liquids down.   You or your child has severe pain, unrelieved with recommended medications.   You or your child are having difficulty breathing (not due to stuffy nose).   You or your child are unable to fully open your mouth.   You or your child develop redness, swelling, or severe pain anywhere on the neck.   You have a fever.   Your baby is older than 3 months with a rectal temperature of 102 F (38.9 C) or higher.   Your baby is 3 months old or younger with a rectal temperature of 100.4 F (38 C) or higher.  MAKE SURE YOU:    Understand these instructions.   Will watch your condition.   Will get help right away if you are not doing well or get worse.  Document Released: 11/30/2005 Document Revised: 02/22/2012 Document Reviewed: 02/27/2008 [employer] Patient Information 2013 [employer].    

## 2013-04-05 NOTE — Progress Notes (Signed)
  Subjective:     Abigail Campos is a 42 y.o. female who presents for evaluation of sore throat. Associated symptoms include pain while swallowing, post nasal drip, sore throat and swollen glands. Onset of symptoms was several days ago, and have been gradually worsening since that time. She is drinking plenty of fluids. She has not had a recent close exposure to someone with proven streptococcal pharyngitis.  Pt is on amoxicillin for tooth abscess  The following portions of the patient's history were reviewed and updated as appropriate: allergies, current medications, past family history, past medical history, past social history, past surgical history and problem list.  Review of Systems Pertinent items are noted in HPI.    Objective:    BP 142/86  Pulse 65  Temp(Src) 98.3 F (36.8 C) (Oral)  Wt 162 lb 6.4 oz (73.664 kg)  BMI 32.78 kg/m2  SpO2 98% General appearance: alert, cooperative, appears stated age and no distress Ears: normal TM's and external ear canals both ears Nose: Nares normal. Septum midline. Mucosa normal. No drainage or sinus tenderness. Throat: abnormal findings: marked oropharyngeal erythema and pnd Neck: mild anterior cervical adenopathy, supple, symmetrical, trachea midline and thyroid not enlarged, symmetric, no tenderness/mass/nodules Lungs: clear to auscultation bilaterally  Laboratory Strep test not done. Results:pt already on abx .    Assessment:    Acute pharyngitis---change to augmentin and take antihistamine                                  Culture done .    Plan:    Patient placed on antibiotics. Use of OTC analgesics recommended as well as salt water gargles. Follow up as needed.

## 2013-04-07 LAB — CULTURE, GROUP A STREP: Organism ID, Bacteria: NORMAL

## 2013-04-10 ENCOUNTER — Ambulatory Visit (INDEPENDENT_AMBULATORY_CARE_PROVIDER_SITE_OTHER): Payer: Managed Care, Other (non HMO) | Admitting: Internal Medicine

## 2013-04-10 ENCOUNTER — Encounter: Payer: Self-pay | Admitting: *Deleted

## 2013-04-10 VITALS — BP 140/86 | HR 78 | Temp 98.5°F | Wt 157.0 lb

## 2013-04-10 DIAGNOSIS — R51 Headache: Secondary | ICD-10-CM

## 2013-04-10 DIAGNOSIS — I10 Essential (primary) hypertension: Secondary | ICD-10-CM

## 2013-04-10 NOTE — Patient Instructions (Addendum)
No more Augmentin Rest, fluis , tylenol or motrin as needed ER or call  if severe headache   Check the  blood pressure 2 or 3 times a week, be sure it is between 110/60 and 140/85. If it is consistently higher or lower, let me know

## 2013-04-10 NOTE — Progress Notes (Signed)
  Subjective:    Patient ID: Abigail Campos, female    DOB: [birth year], 42 y.o.   MRN: [id number]  HPI Acute visit Patient was taken amoxicillin for a dental infection, was seen here last week and change to Augmentin do to a question of a Strep pharyngitis. She had mild global headache 04/08/2013 and noted her BP to be elevated more than baseline. Yesterday she had a severe headache, she states it was worse of her life and associated with nausea, some vomiting. BP was as high as 176/116. She stopped antibiotics thinking that maybe vomiting was the culprit of her symptoms, today the headache is mild and has no nausea.    Past Medical History  Diagnosis Date  . Headache     developed migraines after 2nd pregnancy, had a negative CT of the head  . Hypertension    Past Surgical History  Procedure Laterality Date  . Inguinal hernia repair      2004  . Cesarean section      [id number]  . Abdominal hysterectomy  11-14-07    on oophorectomy per surgical report    Review of Systems Reports good compliance with all BP medications, BP before this last few days was well-controlled per patient. Denies taking any OTC decongestants, no change on her diet. No URI symptoms. No visual disturbances such as diplopia. No slurred speech or facial numbness.     Objective:   Physical Exam BP 140/86  Pulse 78  Temp(Src) 98.5 F (36.9 C) (Oral)  Wt 157 lb (71.215 kg)  BMI 31.69 kg/m2  SpO2 94%  General -- alert, well-developed, BMI .   Neck --no thyromegaly , normal carotid pulse, no LADs HEENT -- EOMI, PERRLA Lungs -- normal respiratory effort, no intercostal retractions, no accessory muscle use, and normal breath sounds.   Heart-- normal rate, regular rhythm, no murmur, and no gallop.   Abdomen--soft, non-tender, no distention, no masses, no HSM, no guarding, and no rigidity.   Extremities-- no pretibial edema bilaterally  Neurologic-- alert & oriented X3 ;Speech, gait, motor and DTRs  symmetric. Neck is full range of motion, no stiffness Psych-- Cognition and judgment appear intact. Alert and cooperative with normal attention span and concentration.  not anxious appearing and not depressed appearing.       Assessment & Plan:

## 2013-04-10 NOTE — Assessment & Plan Note (Addendum)
Intense headache for the last 2 days, described as the worst of her life, at the same time her BP was quite elevated noting that it is  usually well-controlled. Is hard to say if the elevated BP was causing the headaches or vice versa. At this point, she feels better, BP is better. I doubt augmentin was the culprit of her sx  but since the strep test was negative and  the dental infection is better according to the patient, will simply stop it. Plan: Will obtain a CT of the head to assess "the worse headache of her life", will continue with same medications for blood pressure and  I have asked her to monitor her   BP and symptoms. See instructions.

## 2013-04-10 NOTE — Assessment & Plan Note (Signed)
See comments under headaches

## 2013-04-11 ENCOUNTER — Ambulatory Visit (INDEPENDENT_AMBULATORY_CARE_PROVIDER_SITE_OTHER)
Admission: RE | Admit: 2013-04-11 | Discharge: 2013-04-11 | Disposition: A | Discharge status: Home / Self Care | Payer: Managed Care, Other (non HMO) | Source: Ambulatory Visit | Attending: Internal Medicine | Admitting: Internal Medicine

## 2013-04-11 ENCOUNTER — Inpatient Hospital Stay: Admission: RE | Admit: 2013-04-11 | Payer: Managed Care, Other (non HMO) | Source: Ambulatory Visit

## 2013-04-11 DIAGNOSIS — R51 Headache: Secondary | ICD-10-CM

## 2013-04-11 IMAGING — CT CT HEAD W/O CM
1 series · 16 of 30 positions shown, 20 images · non-contrast
Comparison: CT [DATE] of the

CLINICAL DATA: Worst headache of life

CT HEAD WITHOUT CONTRAST
TECHNIQUE: Contiguous axial images were obtained from the base of
the skull through the vertex without contrast

[Series 2: head_seq -c 4.5 h37s st · axial · 0.43mm/px · z∈[-115,+11]mm · 16 of 32 slices shown, 20 images]
[im 2/32  brain]
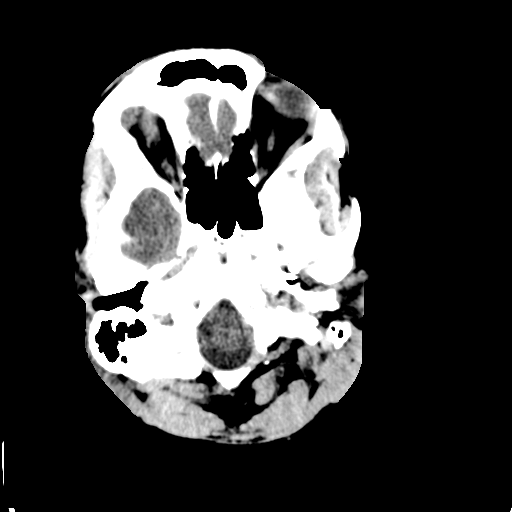
[im 2/32  bone]
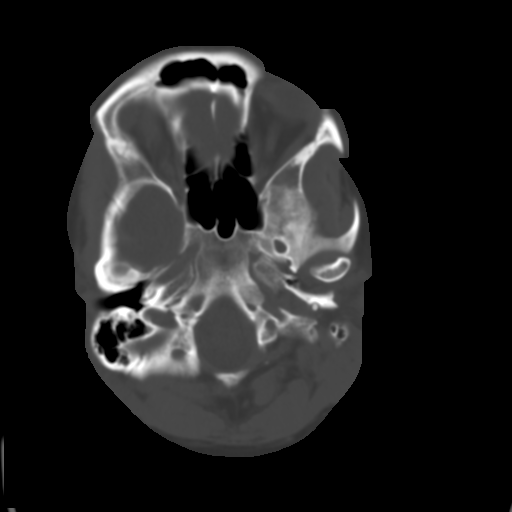
[im 4/32  brain]
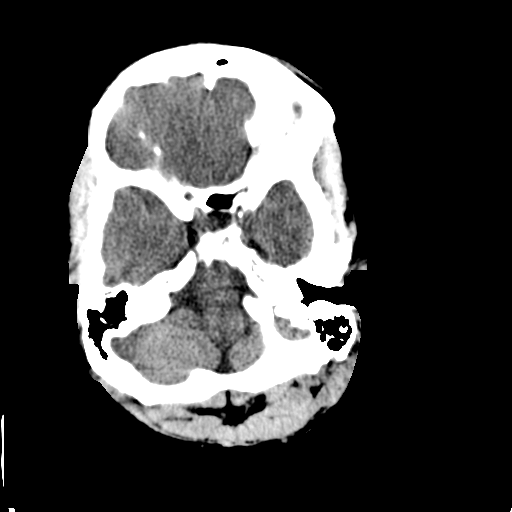
[im 6/32  brain]
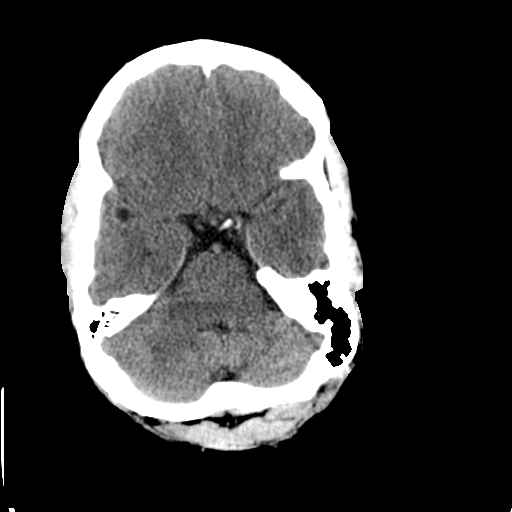
[im 8/32  brain]
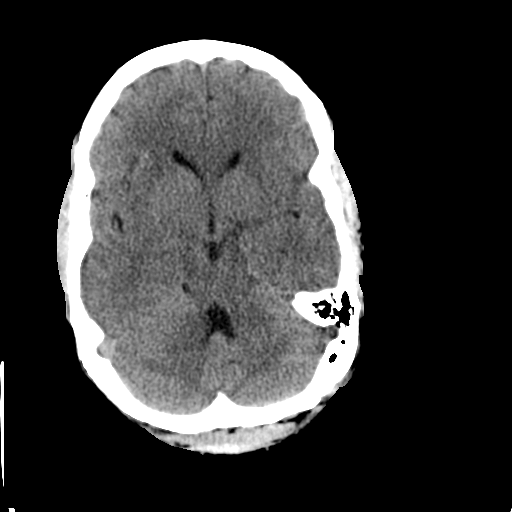
[im 9/32  brain]
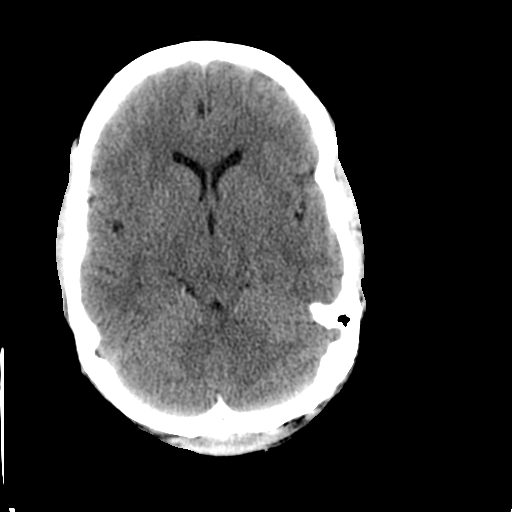
[im 9/32  bone]
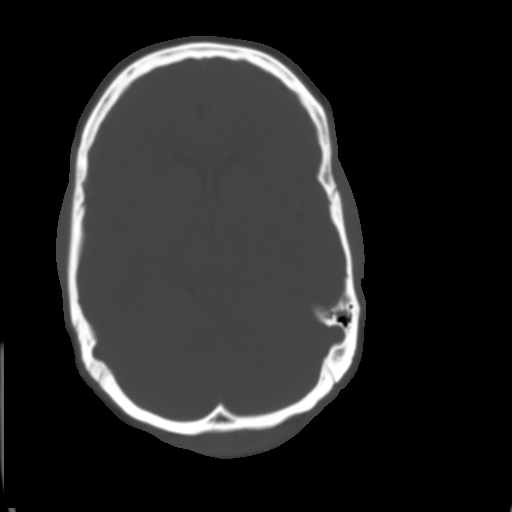
[im 11/32  brain]
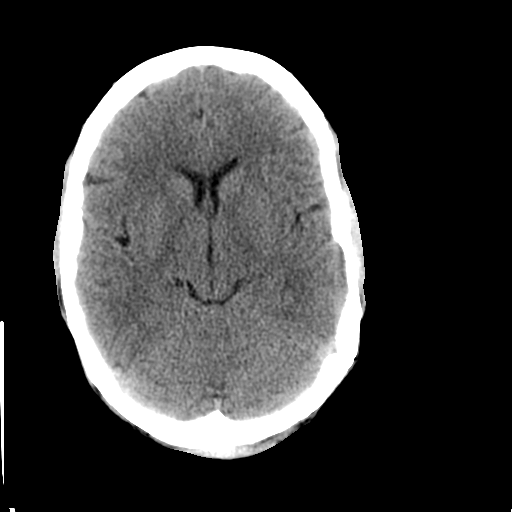
[im 13/32  brain]
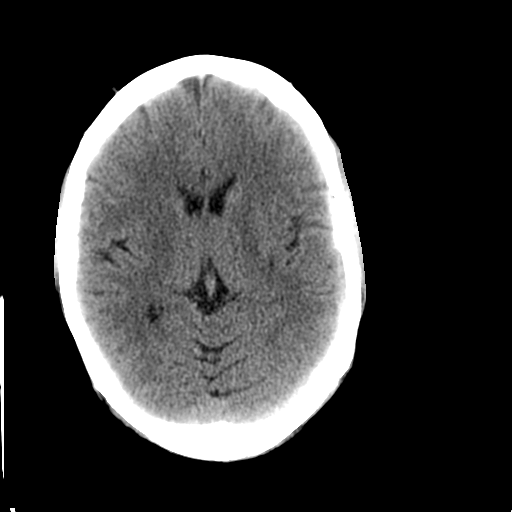
[im 15/32  brain]
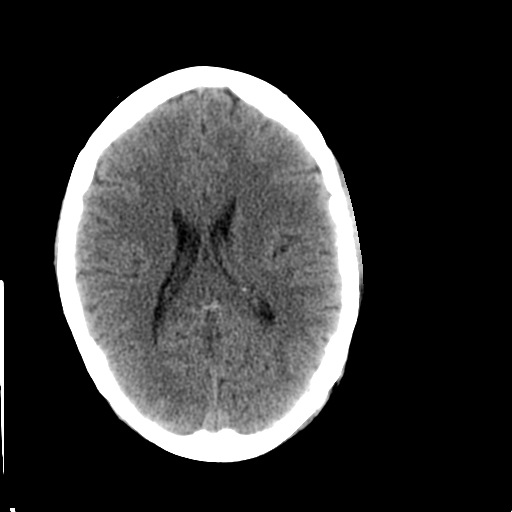
[im 17/32  brain]
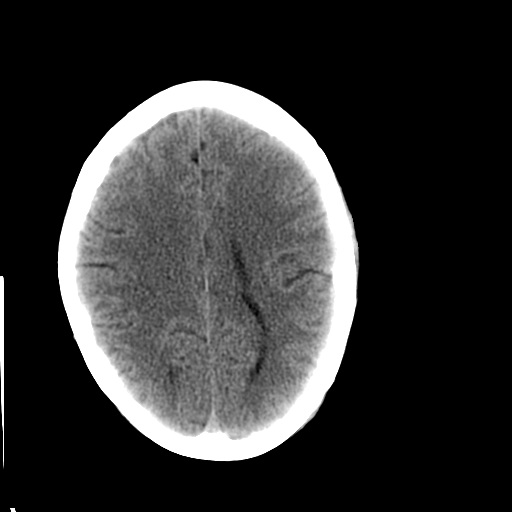
[im 17/32  bone]
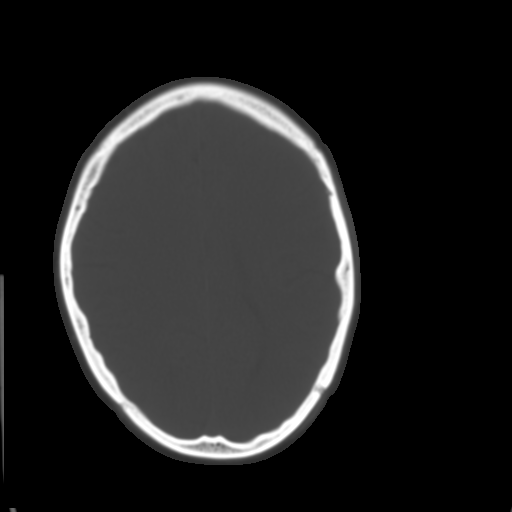
[im 19/32  brain]
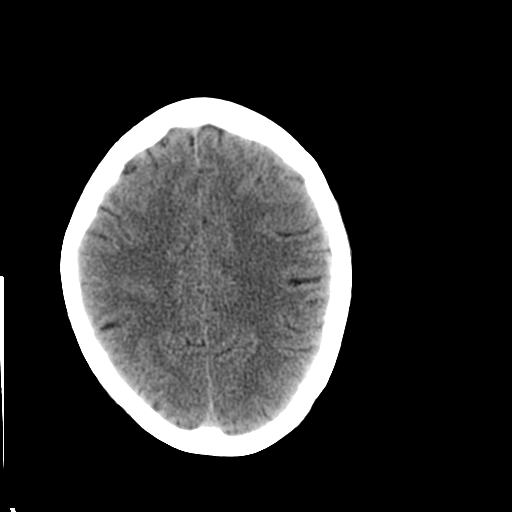
[im 21/32  brain]
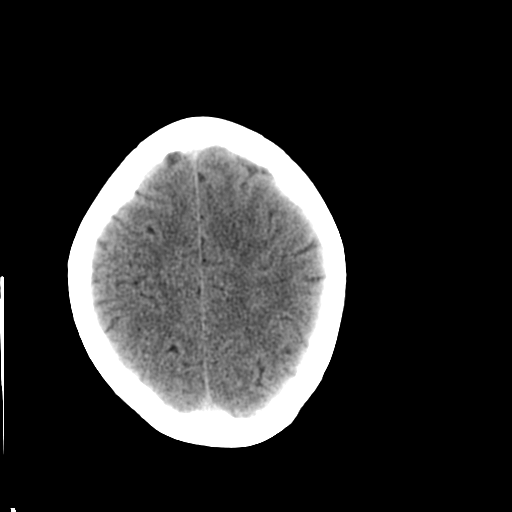
[im 23/32  brain]
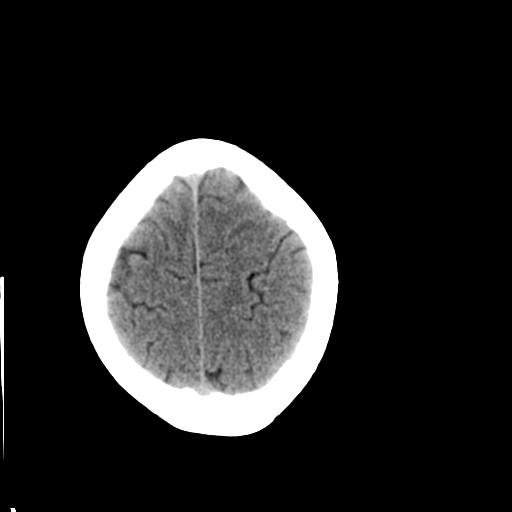
[im 24/32  brain]
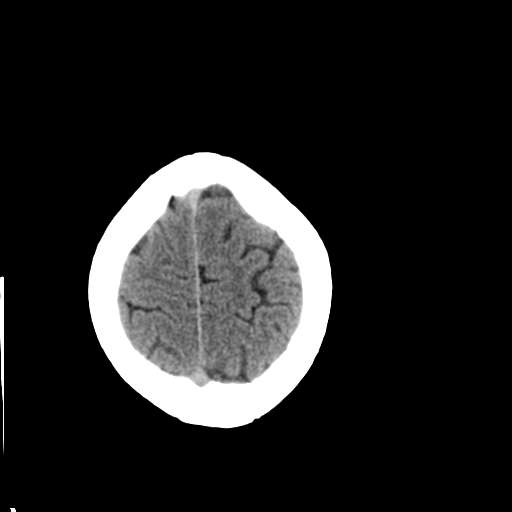
[im 24/32  bone]
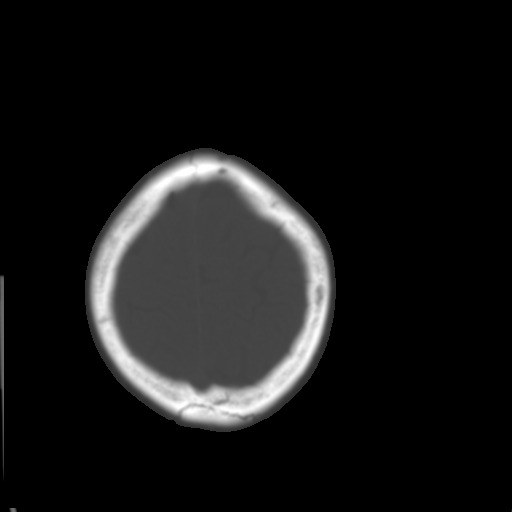
[im 26/32  brain]
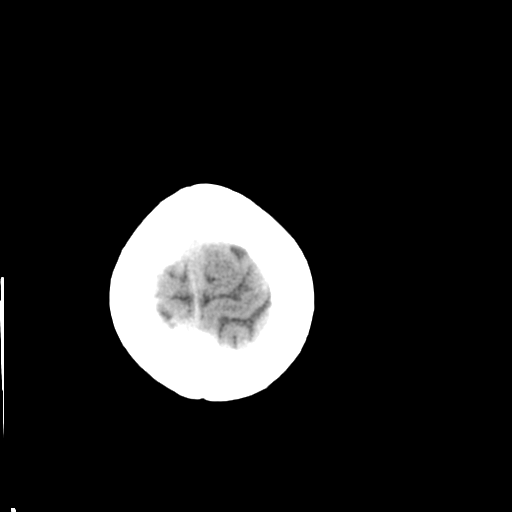
[im 28/32  brain]
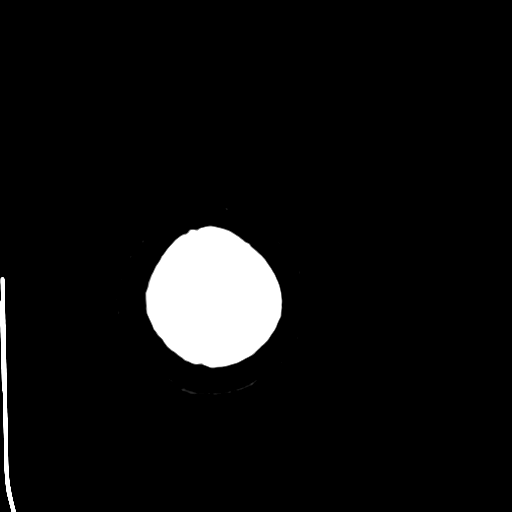
[im 30/32  brain]
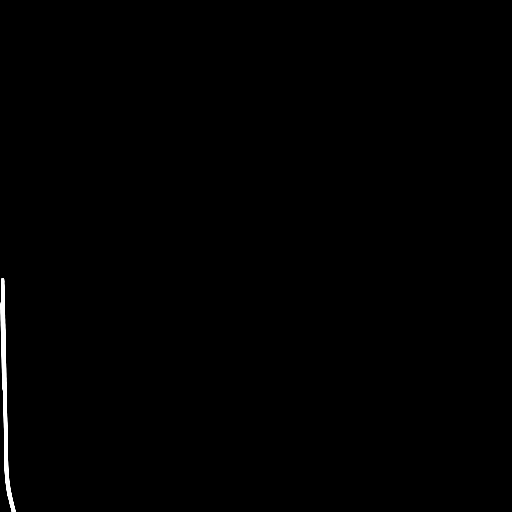

[16 of 30 positions shown; findings below may reference images not displayed]

FINDINGS: The brain has a normal appearance without evidence for
hemorrhage, acute infarction, hydrocephalus, or mass lesion.  There
is no extra axial fluid collection.  The skull and paranasal
sinuses are normal.
IMPRESSION: Normal CT of the head without contrast.

## 2013-05-03 ENCOUNTER — Other Ambulatory Visit: Payer: Self-pay | Admitting: Internal Medicine

## 2013-05-03 NOTE — Telephone Encounter (Signed)
Spoke to pharmacy, they did not receive previous refill. Re-sent rx to pharmacy.

## 2013-07-12 ENCOUNTER — Encounter: Payer: Self-pay | Admitting: Internal Medicine

## 2013-07-12 ENCOUNTER — Ambulatory Visit (INDEPENDENT_AMBULATORY_CARE_PROVIDER_SITE_OTHER): Payer: Managed Care, Other (non HMO) | Admitting: Internal Medicine

## 2013-07-12 VITALS — BP 160/108 | HR 79 | Temp 98.2°F | Ht 60.5 in | Wt 160.6 lb

## 2013-07-12 DIAGNOSIS — I10 Essential (primary) hypertension: Secondary | ICD-10-CM

## 2013-07-12 DIAGNOSIS — R51 Headache: Secondary | ICD-10-CM

## 2013-07-12 DIAGNOSIS — Z0000 Encounter for general adult medical examination without abnormal findings: Secondary | ICD-10-CM

## 2013-07-12 LAB — COMPREHENSIVE METABOLIC PANEL
ALT: 18 U/L (ref 0–35)
AST: 17 U/L (ref 0–37)
Albumin: 3.5 g/dL (ref 3.5–5.2)
Alkaline Phosphatase: 56 U/L (ref 39–117)
BUN: 9 mg/dL (ref 6–23)
CO2: 27 mEq/L (ref 19–32)
Calcium: 9.1 mg/dL (ref 8.4–10.5)
Chloride: 104 mEq/L (ref 96–112)
Creatinine, Ser: 0.8 mg/dL (ref 0.4–1.2)
GFR: 110.24 mL/min (ref >60.00)
Glucose, Bld: 101 mg/dL — ABNORMAL HIGH (ref 70–99)
Potassium: 3.7 mEq/L (ref 3.5–5.1)
Sodium: 136 mEq/L (ref 135–145)
Total Bilirubin: 0.6 mg/dL (ref 0.3–1.2)
Total Protein: 7.1 g/dL (ref 6.0–8.3)

## 2013-07-12 LAB — URINALYSIS
Bilirubin Urine: NEGATIVE
Hgb urine dipstick: NEGATIVE
Ketones, ur: NEGATIVE
Leukocytes, UA: NEGATIVE
Nitrite: NEGATIVE
Specific Gravity, Urine: 1.02 (ref 1.000–1.030)
Total Protein, Urine: NEGATIVE
Urine Glucose: NEGATIVE
Urobilinogen, UA: 0.2 (ref 0.0–1.0)
pH: 6 (ref 5.0–8.0)

## 2013-07-12 LAB — LIPID PANEL
Cholesterol: 174 mg/dL (ref 0–200)
HDL: 44.6 mg/dL (ref >39.00)
LDL Cholesterol: 118 mg/dL — ABNORMAL HIGH (ref 0–99)
Total CHOL/HDL Ratio: 4
Triglycerides: 59 mg/dL (ref 0.0–149.0)
VLDL: 11.8 mg/dL (ref 0.0–40.0)

## 2013-07-12 LAB — CBC WITH DIFFERENTIAL/PLATELET
Basophils Absolute: 0 10*3/uL (ref 0.0–0.1)
Basophils Relative: 0.4 % (ref 0.0–3.0)
Eosinophils Absolute: 0.1 10*3/uL (ref 0.0–0.7)
Eosinophils Relative: 1.4 % (ref 0.0–5.0)
HCT: 39 % (ref 36.0–46.0)
Hemoglobin: 12.8 g/dL (ref 12.0–15.0)
Lymphocytes Relative: 34.5 % (ref 12.0–46.0)
Lymphs Abs: 1.6 10*3/uL (ref 0.7–4.0)
MCHC: 32.9 g/dL (ref 30.0–36.0)
MCV: 91.7 fl (ref 78.0–100.0)
Monocytes Absolute: 0.5 10*3/uL (ref 0.1–1.0)
Monocytes Relative: 10.2 % (ref 3.0–12.0)
Neutro Abs: 2.4 10*3/uL (ref 1.4–7.7)
Neutrophils Relative %: 53.5 % (ref 43.0–77.0)
Platelets: 181 10*3/uL (ref 150.0–400.0)
RBC: 4.25 Mil/uL (ref 3.87–5.11)
RDW: 14.1 % (ref 11.5–14.6)
WBC: 4.6 10*3/uL (ref 4.5–10.5)

## 2013-07-12 LAB — IRON: Iron: 110 ug/dL (ref 42–145)

## 2013-07-12 LAB — FERRITIN: Ferritin: 31.6 ng/mL (ref 10.0–291.0)

## 2013-07-12 MED ORDER — LOSARTAN POTASSIUM-HCTZ 100-25 MG PO TABS: 1.0000 | ORAL_TABLET | Freq: Every day | ORAL | Status: DC

## 2013-07-12 NOTE — Assessment & Plan Note (Signed)
CT head 03-2013 negative. Since her last visit essentially headache free

## 2013-07-12 NOTE — Progress Notes (Signed)
  Subjective:    Patient ID: Abigail Campos, female    DOB: 1971-01-11, 42 y.o.   MRN: [id number]  HPI CPX  Past Medical History  Diagnosis Date  . Headache(784.0)     developed migraines after 2nd pregnancy, had a negative CT of the head  . Hypertension    Past Surgical History  Procedure Laterality Date  . Inguinal hernia repair      05/09/2003  . Cesarean section      [id number]  . Abdominal hysterectomy  11-14-07    no oophorectomy per surgical report   History   Social History  . Marital Status: Married    Spouse Name: N/A    Number of Children: 2  . Years of Education: N/A   Occupational History  . TEACHER    Social History Main Topics  . Smoking status: Never Smoker   . Smokeless tobacco: Never Used  . Alcohol Use: No  . Drug Use: No  . Sexually Active: Not on file   Other Topics Concern  . Not on file   Social History Narrative  . No narrative on file   Family History  Problem Relation Age of Onset  . Diabetes Mother   . Hypertension Mother   . Glaucoma Mother   . Heart disease      2 uncles died 09-May-2011 from CAD-CHF  . Stroke Maternal Grandfather   . Colon cancer Mother     mother age 34  . Diabetes Other     siblings  . Hypertension Other     siblings     Review of Systems Diet--doing okay, healthy, unable to lose weight, staying at 160 pounds. Exercise--taking  walks almost every night. No nausea, vomiting, diarrhea or blood in the stools. No chest pain or shortness or breath No dysuria or gross hematuria     Objective:   Physical Exam BP 160/108  Pulse 79  Temp(Src) 98.2 F (36.8 C) (Oral)  Ht 5' 0.5" (1.537 m)  Wt 160 lb 9.6 oz (72.848 kg)  BMI 30.84 kg/m2  SpO2 99% General -- alert, well-developed, NAD Neck --no thyromegaly Lungs -- normal respiratory effort, no intercostal retractions, no accessory muscle use, and normal breath sounds.   Heart-- normal rate, regular rhythm, no murmur, and no gallop.   Abdomen--soft,  non-tender, no distention, no masses, no bruit.   Extremities-- no pretibial edema bilaterally  Neurologic-- alert & oriented X3 and strength normal in all extremities. Psych-- Cognition and judgment appear intact. Alert and cooperative with normal attention span and concentration.  not anxious appearing and not depressed appearing.       Assessment & Plan:

## 2013-07-12 NOTE — Patient Instructions (Addendum)
Check the  blood pressure 2 or 3 times a week, be sure it is between 110/60 and 130/80. If it is consistently higher or lower, let me know Next visit 3 months, please make an appointment

## 2013-07-12 NOTE — Assessment & Plan Note (Addendum)
Good compliance of medication, BP elevated today, she thinks related to stress (has a test very soon). Ambulatory BP 133/98 one time, now other readings available. Plan:  Continue Plendil, bystolic Increase losartan HCT from 100-12.5 to 100-25 Will segt a BP goal of 130/80 Return to the office in 3 months

## 2013-07-12 NOTE — Assessment & Plan Note (Addendum)
Td 2009 per pt  Diet and exercise improving! She has a family history of colon cancer, start screening at age of 22. FH of CAD, plan is to control CV RF Female care  per gynecology Labs including iron-ferritin d/t mild anemia. Likes a UA check as well

## 2013-07-17 ENCOUNTER — Encounter: Payer: Self-pay | Admitting: *Deleted

## 2013-07-24 ENCOUNTER — Other Ambulatory Visit: Payer: Self-pay | Admitting: *Deleted

## 2013-07-25 ENCOUNTER — Telehealth: Payer: Self-pay | Admitting: *Deleted

## 2013-07-25 NOTE — Telephone Encounter (Signed)
Attempted to contact pt. Per request Dr. Drue Novel due to last UDS results. Requested to ask patient where is the hydrocodone and hydromorphone be prescribed, this medication is not on her medication list. Left message on 8/11 and today to return call to office to clarify.

## 2013-07-28 NOTE — Telephone Encounter (Signed)
lmovm to return call  °

## 2013-08-04 NOTE — Telephone Encounter (Signed)
Attempted once again to contact pt. To clarify requested information by Dr. Drue Novel. Voice mail full, unable to leave message. Will mail letter and ask pt. To contact office.

## 2013-09-04 ENCOUNTER — Telehealth: Payer: Self-pay | Admitting: Internal Medicine

## 2013-09-04 NOTE — Telephone Encounter (Signed)
Patient Information:  Caller Name: Lucille  Phone: [phone]  Patient: Abigail Campos, Abigail Campos  Gender: Female  DOB: 08/08/1971  Age: 42 Years  PCP: Willow Ora  Pregnant: No  Office Follow Up:  Does the office need to follow up with this patient?: Yes  Instructions For The Office: Needs to be seen today.  Has an appt with Dr. Drue Novel 09/05/13 at 1100.   Symptoms  Reason For Call & Symptoms: Pt calling that she gets sharp pains up under the breasts.  Has an appt for 1100 09/05/13.  SX started 08/31/13 and it is intermittent.  It lasts only a few seconds and having no breathing difficulty.  Reviewed Health History In EMR: Yes  Reviewed Medications In EMR: Yes  Reviewed Allergies In EMR: Yes  Reviewed Surgeries / Procedures: Yes  Date of Onset of Symptoms: 08/31/2013 OB / GYN:  LMP: Unknown  Guideline(s) Used:  Chest Pain  Disposition Per Guideline:   See Today in Office  Reason For Disposition Reached:   Intermittent chest pains persist > 3 days  Advice Given:  Fleeting Chest Pain:  Fleeting chest pains that last only a few seconds and then go away are generally not serious. They may be from pinched muscles or nerves in your chest wall.  Patient Will Follow Care Advice:  YES

## 2013-09-05 ENCOUNTER — Encounter: Payer: Self-pay | Admitting: Internal Medicine

## 2013-09-05 ENCOUNTER — Ambulatory Visit: Payer: Managed Care, Other (non HMO) | Admitting: Internal Medicine

## 2013-09-05 ENCOUNTER — Ambulatory Visit (INDEPENDENT_AMBULATORY_CARE_PROVIDER_SITE_OTHER): Payer: Managed Care, Other (non HMO) | Admitting: Internal Medicine

## 2013-09-05 VITALS — BP 127/85 | HR 75 | Temp 99.1°F | Wt 162.6 lb

## 2013-09-05 DIAGNOSIS — R079 Chest pain, unspecified: Secondary | ICD-10-CM

## 2013-09-05 DIAGNOSIS — G4700 Insomnia, unspecified: Secondary | ICD-10-CM

## 2013-09-05 DIAGNOSIS — I10 Essential (primary) hypertension: Secondary | ICD-10-CM

## 2013-09-05 NOTE — Assessment & Plan Note (Signed)
Last UDS  high risk, + for hydrocodone and hydromorphone Patient denies taking any medication such as a painkiller. Plan: Repeat UDS

## 2013-09-05 NOTE — Assessment & Plan Note (Signed)
Few days history of on and off chest pain, description is atypical for serious conditions such as CAD, pulmonary emboli, et Karie Soda. Exam is benign Recommend observation, if symptoms persist, would recommend a chest x-ray. Also recommend patient to call anytime if she develops a rash.

## 2013-09-05 NOTE — Patient Instructions (Signed)
Get your UDS before you leave  Next visit in  4 months   for a check up Please make an appointment before you leave the office today (or call few weeks in advance)  ---- Take Tylenol as needed Pain, if the pain is more intense or frequent or if it becomes severe : let me know. Also, call if you develop any rash in the left side.

## 2013-09-05 NOTE — Assessment & Plan Note (Signed)
Under much better control, no change, follow up 4 months

## 2013-09-05 NOTE — Progress Notes (Signed)
  Subjective:    Patient ID: Abigail Campos, female    DOB: [birth year], 42 y.o.   MRN: [id number]  HPI Acute visit Developed chest pain 5 days ago, is on and off, sharp, located at the left side by the breast, last few seconds. Not associated with deep breaths or moving her arms. Not taking any medication for the pain at this point. Hypertension--we adjust her medication the last time she was here, ambulatory BPs 120/80. History of insomnia, taking Ambien prn, has taken it very rarely, last UDS high risk, see assessment and plan.  Past Medical History  Diagnosis Date  . Headache(784.0)     developed migraines after 2nd pregnancy, had a negative CT of the head  . Hypertension    Past Surgical History  Procedure Laterality Date  . Inguinal hernia repair      2004  . Cesarean section      [id number]  . Abdominal hysterectomy  11-14-07    no oophorectomy per surgical report     Review of Systems No fever or chills No cough or shortness or breath No nausea, vomiting, diarrhea or heartburn. No recent airplane trip or prolonged car trip. No swelling or pain at calves     Objective:   Physical Exam BP 127/85  Pulse 75  Temp(Src) 99.1 F (37.3 C)  Wt 162 lb 9.6 oz (73.755 kg)  BMI 31.22 kg/m2  SpO2 99%  General -- alert, well-developed, NAD.  Neck --no mass or LAD  Lungs -- normal respiratory effort, no intercostal retractions, no accessory muscle use, and normal breath sounds.  Chest wall--not tender to palpation, no anterior or posterior rash. Heart-- normal rate, regular rhythm, no murmur.  Breast--no dominant mass, no axillary lymphadenopathy  Abdomen-- Not distended, good bowel sounds,soft, non-tender. Extremities-- no pretibial edema bilaterally  Neurologic--  alert & oriented X3.  Psych-- Cognition and judgment appear intact. Cooperative with normal attention span and concentration. No anxious appearing , no depressed appearing.       Assessment & Plan:

## 2013-09-06 ENCOUNTER — Encounter: Payer: Self-pay | Admitting: Internal Medicine

## 2013-11-12 ENCOUNTER — Encounter (HOSPITAL_BASED_OUTPATIENT_CLINIC_OR_DEPARTMENT_OTHER): Payer: Self-pay | Admitting: Emergency Medicine

## 2013-11-12 ENCOUNTER — Emergency Department (HOSPITAL_BASED_OUTPATIENT_CLINIC_OR_DEPARTMENT_OTHER)
Admission: EM | Admit: 2013-11-12 | Discharge: 2013-11-12 | Disposition: A | Discharge status: Home / Self Care | Payer: Managed Care, Other (non HMO) | Attending: Emergency Medicine | Admitting: Emergency Medicine

## 2013-11-12 DIAGNOSIS — Z91040 Latex allergy status: Secondary | ICD-10-CM | POA: Insufficient documentation

## 2013-11-12 DIAGNOSIS — K1329 Other disturbances of oral epithelium, including tongue: Secondary | ICD-10-CM | POA: Insufficient documentation

## 2013-11-12 DIAGNOSIS — Z79899 Other long term (current) drug therapy: Secondary | ICD-10-CM | POA: Insufficient documentation

## 2013-11-12 DIAGNOSIS — I10 Essential (primary) hypertension: Secondary | ICD-10-CM | POA: Insufficient documentation

## 2013-11-12 MED ORDER — OXYCODONE-ACETAMINOPHEN 5-325 MG PO TABS: 1.0000 | ORAL_TABLET | ORAL | Status: DC | PRN

## 2013-11-12 MED ORDER — PENICILLIN V POTASSIUM 500 MG PO TABS: 500.0000 mg | ORAL_TABLET | Freq: Three times a day (TID) | ORAL | Status: DC

## 2013-11-12 MED ORDER — PENICILLIN V POTASSIUM 250 MG PO TABS
500.0000 mg | ORAL_TABLET | Freq: Once | ORAL | Status: AC
  Administered 2013-11-12: 500 mg via ORAL
  Filled 2013-11-12: qty 2

## 2013-11-12 MED ORDER — OXYCODONE-ACETAMINOPHEN 5-325 MG PO TABS
2.0000 | ORAL_TABLET | Freq: Once | ORAL | Status: AC
  Administered 2013-11-12: 2 via ORAL
  Filled 2013-11-12: qty 2

## 2013-11-12 NOTE — ED Provider Notes (Signed)
[name]: [id number]     Arrival date & time 11/12/13  1020 History   First MD Initiated Contact with Patient 11/12/13 1118     Chief Complaint  Patient presents with  . Dental Pain   (Consider location/radiation/quality/duration/timing/severity/associated sxs/prior Treatment) HPI  Patient presents today complaining of pain and bleeding from an area of her left upper gum. She states she had a partial extraction of a tooth done here 20 years ago and since that time has had the excess area. She noticed an increasing pain and bleeding from it yesterday. She thinks she may have an infection of this area presented secondary to that. The pain is localized to the left upper gum. She does not have any pain in her neck denies fever or chills.  Past Medical History  Diagnosis Date  . Headache(784.0)     developed migraines after 2nd pregnancy, had a negative CT of the head  . Hypertension    Past Surgical History  Procedure Laterality Date  . Inguinal hernia repair      01-May-2003  . Cesarean section      [id number]  . Abdominal hysterectomy  11-14-07    no oophorectomy per surgical report   Family History  Problem Relation Age of Onset  . Diabetes Mother   . Hypertension Mother   . Glaucoma Mother   . Heart disease      2 uncles died 2011/05/01 from CAD-CHF  . Stroke Maternal Grandfather   . Colon cancer Mother     mother age 99  . Diabetes Other     siblings  . Hypertension Other     siblings   History  Substance Use Topics  . Smoking status: Never Smoker   . Smokeless tobacco: Never Used  . Alcohol Use: No   OB History   Grav Para Term Preterm Abortions TAB SAB Ect Mult Living   2 2             Review of Systems  All other systems reviewed and are negative.    Allergies  Hydrocodone; Aspirin; Latex; Other; and Peanut-containing drug products  Home Medications   Current Outpatient Rx  Name  Route  Sig  Dispense  Refill  . penicillin v potassium (VEETID) 500 MG tablet   Oral  Take 500 mg by mouth 4 (four) times daily.         . felodipine (PLENDIL) 10 MG 24 hr tablet   Oral   Take 1 tablet (10 mg total) by mouth daily.   30 tablet   5   . losartan-hydrochlorothiazide (HYZAAR) 100-25 MG per tablet   Oral   Take 1 tablet by mouth daily.   30 tablet   6   . Multiple Vitamin (MULTIVITAMIN) tablet   Oral   Take 1 tablet by mouth 2 (two) times daily.           . nebivolol (BYSTOLIC) 10 MG tablet   Oral   Take 1 tablet (10 mg total) by mouth daily.   30 tablet   5   . zolpidem (AMBIEN) 10 MG tablet      TAKE 1 TABLET BY MOUTH AT BEDTIME AS NEEDED FOR SLEEP   30 tablet   5    BP 163/113  Pulse 76  Temp(Src) 98.9 F (37.2 C) (Oral)  Resp 18  SpO2 97% Physical Exam  Nursing note and vitals reviewed. Constitutional: She is oriented to person, place, and time. She appears well-developed and well-nourished.  HENT:  Head: Normocephalic and atraumatic.  Right Ear: External ear normal.  Left Ear: External ear normal.  Mouth/Throat: Uvula is midline and oropharynx is clear and moist.    Tissue extending from the socket of left upper second molar. Appears to be multilobular. There are some areas that appeared to be whitish and could possibly be pus. No swelling of the gum itself above this area.  Eyes: Conjunctivae and EOM are normal. Pupils are equal, round, and reactive to light.  Neck: Normal range of motion. Neck supple. No thyromegaly present.  Cardiovascular: Normal rate and regular rhythm.   Pulmonary/Chest: Effort normal.  Abdominal: Soft. Bowel sounds are normal.  Musculoskeletal: Normal range of motion.  Neurological: She is alert and oriented to person, place, and time.  Skin: Skin is warm and dry.  Psychiatric: She has a normal mood and affect. Her behavior is normal.    ED Course  Procedures (including critical care time) Labs Review Labs Reviewed - No data to display Imaging Review No results found.  EKG Interpretation    None      needle aspiration was attempted of the mass in the left upper, area. Was very friable nose bleeding but no pus was obtained. This was done with a 20-gauge needle but no local anesthetic and the patient tolerated the procedure well.  MDM  No diagnosis found. This could represent infection and I am going to place the patient on penicillin. However, given the abnormal shape of the area and bleeding without any pus I am going to advise the patient that she have close followup with oral surgeon. She is given referral to Dr. Barbette Merino and voices understanding of the need for followup. Is given return precautions and voices understanding of these.    Hilario Quarry, MD 11/12/13 1146

## 2013-11-12 NOTE — ED Notes (Signed)
Patient here with left upper gum swelling and pain. Patient reports that her gum already hung down from previous dental work, now pain and swelling to same x 1 day

## 2013-11-15 ENCOUNTER — Encounter (HOSPITAL_COMMUNITY): Payer: Self-pay | Admitting: Emergency Medicine

## 2013-11-15 ENCOUNTER — Emergency Department (HOSPITAL_COMMUNITY)
Admission: EM | Admit: 2013-11-15 | Discharge: 2013-11-16 | Disposition: A | Discharge status: Home / Self Care | Payer: Managed Care, Other (non HMO) | Attending: Emergency Medicine | Admitting: Emergency Medicine

## 2013-11-15 DIAGNOSIS — R51 Headache: Secondary | ICD-10-CM | POA: Insufficient documentation

## 2013-11-15 DIAGNOSIS — I10 Essential (primary) hypertension: Secondary | ICD-10-CM

## 2013-11-15 DIAGNOSIS — Z79899 Other long term (current) drug therapy: Secondary | ICD-10-CM | POA: Insufficient documentation

## 2013-11-15 DIAGNOSIS — Z91040 Latex allergy status: Secondary | ICD-10-CM | POA: Insufficient documentation

## 2013-11-15 LAB — URINALYSIS, ROUTINE W REFLEX MICROSCOPIC
Bilirubin Urine: NEGATIVE
Glucose, UA: NEGATIVE mg/dL
Hgb urine dipstick: NEGATIVE
Ketones, ur: NEGATIVE mg/dL
Leukocytes, UA: NEGATIVE
Nitrite: NEGATIVE
Protein, ur: NEGATIVE mg/dL
Specific Gravity, Urine: 1.029 (ref 1.005–1.030)
Urobilinogen, UA: 1 mg/dL (ref 0.0–1.0)
pH: 7.5 (ref 5.0–8.0)

## 2013-11-15 NOTE — ED Notes (Signed)
Pt c/o dizziness with intermittent HA since Sunday, pt states BP has been elevated since Sunday. Pt states she is compliant with antihypertensives

## 2013-11-15 NOTE — ED Provider Notes (Signed)
[name]: [id number]     Arrival date & time 11/15/13  05/22/2127 History   First MD Initiated Contact with Patient 11/15/13 2302-05-21     Chief Complaint  Patient presents with  . Dizziness   (Consider location/radiation/quality/duration/timing/severity/associated sxs/prior Treatment) The history is provided by the patient.   Patient complains of bitemporal headache x2 days that is worse with standing and associated with becoming dizzy. No fever or chills. No neck pain or photophobia. Recently treated for a dental infection with penicillin. Does have a history of hypertension has been compliant with her medications. Denies any chest pain shortness of breath. No abdominal pain. No syncope or near-syncope. No history of same and no treatment used prior to arrival. Past Medical History  Diagnosis Date  . Headache(784.0)     developed migraines after 2nd pregnancy, had a negative CT of the head  . Hypertension    Past Surgical History  Procedure Laterality Date  . Inguinal hernia repair      May 22, 2003  . Cesarean section      [id number]  . Abdominal hysterectomy  11-14-07    no oophorectomy per surgical report   Family History  Problem Relation Age of Onset  . Diabetes Mother   . Hypertension Mother   . Glaucoma Mother   . Heart disease      2 uncles died 2011/05/22 from CAD-CHF  . Stroke Maternal Grandfather   . Colon cancer Mother     mother age 15  . Diabetes Other     siblings  . Hypertension Other     siblings   History  Substance Use Topics  . Smoking status: Never Smoker   . Smokeless tobacco: Never Used  . Alcohol Use: No   OB History   Grav Para Term Preterm Abortions TAB SAB Ect Mult Living   2 2             Review of Systems  All other systems reviewed and are negative.    Allergies  Hydrocodone; Aspirin; Latex; Other; and Peanut-containing drug products  Home Medications   Current Outpatient Rx  Name  Route  Sig  Dispense  Refill  . felodipine (PLENDIL) 10 MG 24 hr  tablet   Oral   Take 1 tablet (10 mg total) by mouth daily.   30 tablet   5   . losartan-hydrochlorothiazide (HYZAAR) 100-25 MG per tablet   Oral   Take 1 tablet by mouth daily.   30 tablet   6   . Multiple Vitamin (MULTIVITAMIN) tablet   Oral   Take 1 tablet by mouth 2 (two) times daily.           . nebivolol (BYSTOLIC) 10 MG tablet   Oral   Take 1 tablet (10 mg total) by mouth daily.   30 tablet   5   . oxyCODONE-acetaminophen (PERCOCET/ROXICET) 5-325 MG per tablet   Oral   Take 1 tablet by mouth every 4 (four) hours as needed for severe pain.   15 tablet   0   . penicillin v potassium (VEETID) 500 MG tablet   Oral   Take 500 mg by mouth 4 (four) times daily.         . penicillin v potassium (VEETID) 500 MG tablet   Oral   Take 1 tablet (500 mg total) by mouth 3 (three) times daily.   30 tablet   0   . zolpidem (AMBIEN) 10 MG tablet      TAKE  1 TABLET BY MOUTH AT BEDTIME AS NEEDED FOR SLEEP   30 tablet   5    BP 191/108  Pulse 71  Temp(Src) 98.8 F (37.1 C) (Oral)  Resp 15  Ht 4\' 11"  (1.499 m)  Wt 156 lb (70.761 kg)  BMI 31.49 kg/m2  SpO2 98% Physical Exam  Nursing note and vitals reviewed. Constitutional: She is oriented to person, place, and time. She appears well-developed and well-nourished.  Non-toxic appearance. No distress.  HENT:  Head: Normocephalic and atraumatic.  Eyes: Conjunctivae, EOM and lids are normal. Pupils are equal, round, and reactive to light.  Neck: Normal range of motion. Neck supple. No tracheal deviation present. No mass present.  Cardiovascular: Normal rate, regular rhythm and normal heart sounds.  Exam reveals no gallop.   No murmur heard. Pulmonary/Chest: Effort normal and breath sounds normal. No stridor. No respiratory distress. She has no decreased breath sounds. She has no wheezes. She has no rhonchi. She has no rales.  Abdominal: Soft. Normal appearance and bowel sounds are normal. She exhibits no distension.  There is no tenderness. There is no rebound and no CVA tenderness.  Musculoskeletal: Normal range of motion. She exhibits no edema and no tenderness.  Neurological: She is alert and oriented to person, place, and time. She has normal strength. No cranial nerve deficit or sensory deficit. GCS eye subscore is 4. GCS verbal subscore is 5. GCS motor subscore is 6.  Skin: Skin is warm and dry. No abrasion and no rash noted.  Psychiatric: She has a normal mood and affect. Her speech is normal and behavior is normal.    ED Course  Procedures (including critical care time) Labs Review Labs Reviewed  BASIC METABOLIC PANEL  URINALYSIS, ROUTINE W REFLEX MICROSCOPIC   Imaging Review No results found.  EKG Interpretation    Date/Time:  Wednesday November 15 2013 22:15:52 EST Ventricular Rate:  66 PR Interval:  147 QRS Duration: 81 QT Interval:  397 QTC Calculation: 416 R Axis:   62 Text Interpretation:  Sinus rhythm Borderline T wave abnormalities No significant change since last tracing Confirmed by Ishmeal Rorie  MD, Antanasia Kaczynski ([id number]) on 11/15/2013 11:24:04 PM            MDM  No diagnosis found.  Patient given clonidine and blood pressure has improved. She will followup with her Dr. for a blood pressure medication adjustment  Toy Baker, MD 11/16/13 [phone]

## 2013-11-16 LAB — BASIC METABOLIC PANEL
BUN: 12 mg/dL (ref 6–23)
CO2: 29 mEq/L (ref 19–32)
Calcium: 9.6 mg/dL (ref 8.4–10.5)
Chloride: 97 mEq/L (ref 96–112)
Creatinine, Ser: 0.75 mg/dL (ref 0.50–1.10)
GFR calc Af Amer: >90 mL/min (ref >90)
GFR calc non Af Amer: >90 mL/min (ref >90)
Glucose, Bld: 110 mg/dL — ABNORMAL HIGH (ref 70–99)
Potassium: 3.5 mEq/L (ref 3.5–5.1)
Sodium: 133 mEq/L — ABNORMAL LOW (ref 135–145)

## 2013-11-16 MED ORDER — CLONIDINE HCL 0.1 MG PO TABS
0.1000 mg | ORAL_TABLET | Freq: Once | ORAL | Status: AC
  Administered 2013-11-16: 0.1 mg via ORAL
  Filled 2013-11-16: qty 1

## 2013-11-21 ENCOUNTER — Encounter: Payer: Self-pay | Admitting: Internal Medicine

## 2013-11-21 ENCOUNTER — Ambulatory Visit (INDEPENDENT_AMBULATORY_CARE_PROVIDER_SITE_OTHER): Payer: Managed Care, Other (non HMO) | Admitting: Internal Medicine

## 2013-11-21 VITALS — BP 133/89 | HR 78 | Temp 98.2°F | Wt 162.0 lb

## 2013-11-21 DIAGNOSIS — Z23 Encounter for immunization: Secondary | ICD-10-CM

## 2013-11-21 DIAGNOSIS — G4700 Insomnia, unspecified: Secondary | ICD-10-CM

## 2013-11-21 DIAGNOSIS — I10 Essential (primary) hypertension: Secondary | ICD-10-CM

## 2013-11-21 NOTE — Progress Notes (Signed)
   Subjective:    Patient ID: Abigail Campos, female    DOB: [birth year], 42 y.o.   MRN: [id number]  HPI Here for a ER followup Abigail Campos to the ER 11/12/2013, had a dental problem, was prescribed penicillin and is to see a Designer, industrial/product in few days. Went to the ER again 11/15/2013, she was having dizziness for 2 or 3 days,Symptoms described as an imbalance whenever she moved fast or stand up quickly  The day of the  ER visit her BP at home was completely normal, she was taking it regularly and it was always ok, However at the ER her blood pressure was 191/108, it got better after clonidine and she was released home.  Past Medical History  Diagnosis Date  . Headache(784.0)     developed migraines after 2nd pregnancy, had a negative CT of the head  . Hypertension    Past Surgical History  Procedure Laterality Date  . Inguinal hernia repair      2004  . Cesarean section      [id number]  . Abdominal hysterectomy  11-14-07    no oophorectomy per surgical report      Review of Systems Since she left the ER, she is feeling well, dizziness resolved. She never had any headache, diplopia, slurred speech, facial paresthesias or motor deficits. She is following a low-salt diet. She does take the Motrin now and then. BP yesterday was in the 130s/80 range, this morning at home 156/99.    Objective:   Physical Exam  BP 133/89  Pulse 78  Temp(Src) 98.2 F (36.8 C)  Wt 162 lb (73.483 kg)  SpO2 98% General -- alert, well-developed, NAD.   Lungs -- normal respiratory effort, no intercostal retractions, no accessory muscle use, and normal breath sounds.  Heart-- normal rate, regular rhythm, no murmur.   Extremities-- no pretibial edema bilaterally  Neurologic--  alert & oriented X3. Speech normal, gait normal, strength normal in all extremities.  EOMI, PERLA   Psych-- Cognition and judgment appear intact. Cooperative with normal attention span and concentration. No anxious appearing , no  depressed appearing.      Assessment & Plan:  Today , I spent more than 25 min with the patient, >50% of the time counseling, and  reviewing the chart (2 ER visits)

## 2013-11-21 NOTE — Assessment & Plan Note (Signed)
UDS 09-05-13:  Low risk

## 2013-11-21 NOTE — Assessment & Plan Note (Signed)
Recently seen at the ER with dizziness, her BP was quite elevated and responded well to clonidin. Since he left the ER, dizziness resolve, BP today better. Plan:  No change in medications Continue checking her BP---> If not  At goal consider clonidine Avoid Motrin which she takes from time to time.

## 2013-11-21 NOTE — Patient Instructions (Signed)
Continue with same medications,  Check the  blood pressure 2 or 3 times a  week be sure it is between 110/60 and 140/85. Ideal blood pressure is 120/80. If it is consistently higher or lower, let me know   Avoid motrin-Ibuprofen-naproxen-similar medications Tylenol is ok

## 2013-11-21 NOTE — Progress Notes (Signed)
Pre visit review using our clinic review tool, if applicable. No additional management support is needed unless otherwise documented below in the visit note. 

## 2013-12-06 ENCOUNTER — Telehealth: Payer: Self-pay | Admitting: *Deleted

## 2013-12-06 NOTE — Telephone Encounter (Signed)
UDS on 07/12/2013 showed patient had significant levels of hydrocodone 541.0 out of 50ng/mL and hydromorphone 345.0 out of 50ng mL. Provider deemed pt at High Risk.

## 2013-12-11 ENCOUNTER — Emergency Department (HOSPITAL_BASED_OUTPATIENT_CLINIC_OR_DEPARTMENT_OTHER)
Admission: EM | Admit: 2013-12-11 | Discharge: 2013-12-11 | Disposition: A | Discharge status: Home / Self Care | Payer: Managed Care, Other (non HMO) | Attending: Emergency Medicine | Admitting: Emergency Medicine

## 2013-12-11 ENCOUNTER — Encounter (HOSPITAL_BASED_OUTPATIENT_CLINIC_OR_DEPARTMENT_OTHER): Payer: Self-pay | Admitting: Emergency Medicine

## 2013-12-11 ENCOUNTER — Telehealth: Payer: Self-pay | Admitting: *Deleted

## 2013-12-11 ENCOUNTER — Emergency Department (HOSPITAL_BASED_OUTPATIENT_CLINIC_OR_DEPARTMENT_OTHER): Payer: Managed Care, Other (non HMO)

## 2013-12-11 DIAGNOSIS — Z79899 Other long term (current) drug therapy: Secondary | ICD-10-CM | POA: Insufficient documentation

## 2013-12-11 DIAGNOSIS — R059 Cough, unspecified: Secondary | ICD-10-CM

## 2013-12-11 DIAGNOSIS — R05 Cough: Secondary | ICD-10-CM

## 2013-12-11 DIAGNOSIS — Z91040 Latex allergy status: Secondary | ICD-10-CM | POA: Insufficient documentation

## 2013-12-11 DIAGNOSIS — Z792 Long term (current) use of antibiotics: Secondary | ICD-10-CM | POA: Insufficient documentation

## 2013-12-11 DIAGNOSIS — I10 Essential (primary) hypertension: Secondary | ICD-10-CM | POA: Insufficient documentation

## 2013-12-11 IMAGING — CR DG CHEST 2V
2 series · 2 of 2 positions shown · non-contrast
Comparison: [DATE]

CLINICAL DATA: Cough

EXAM:
CHEST  2 VIEW

[w chest pa]
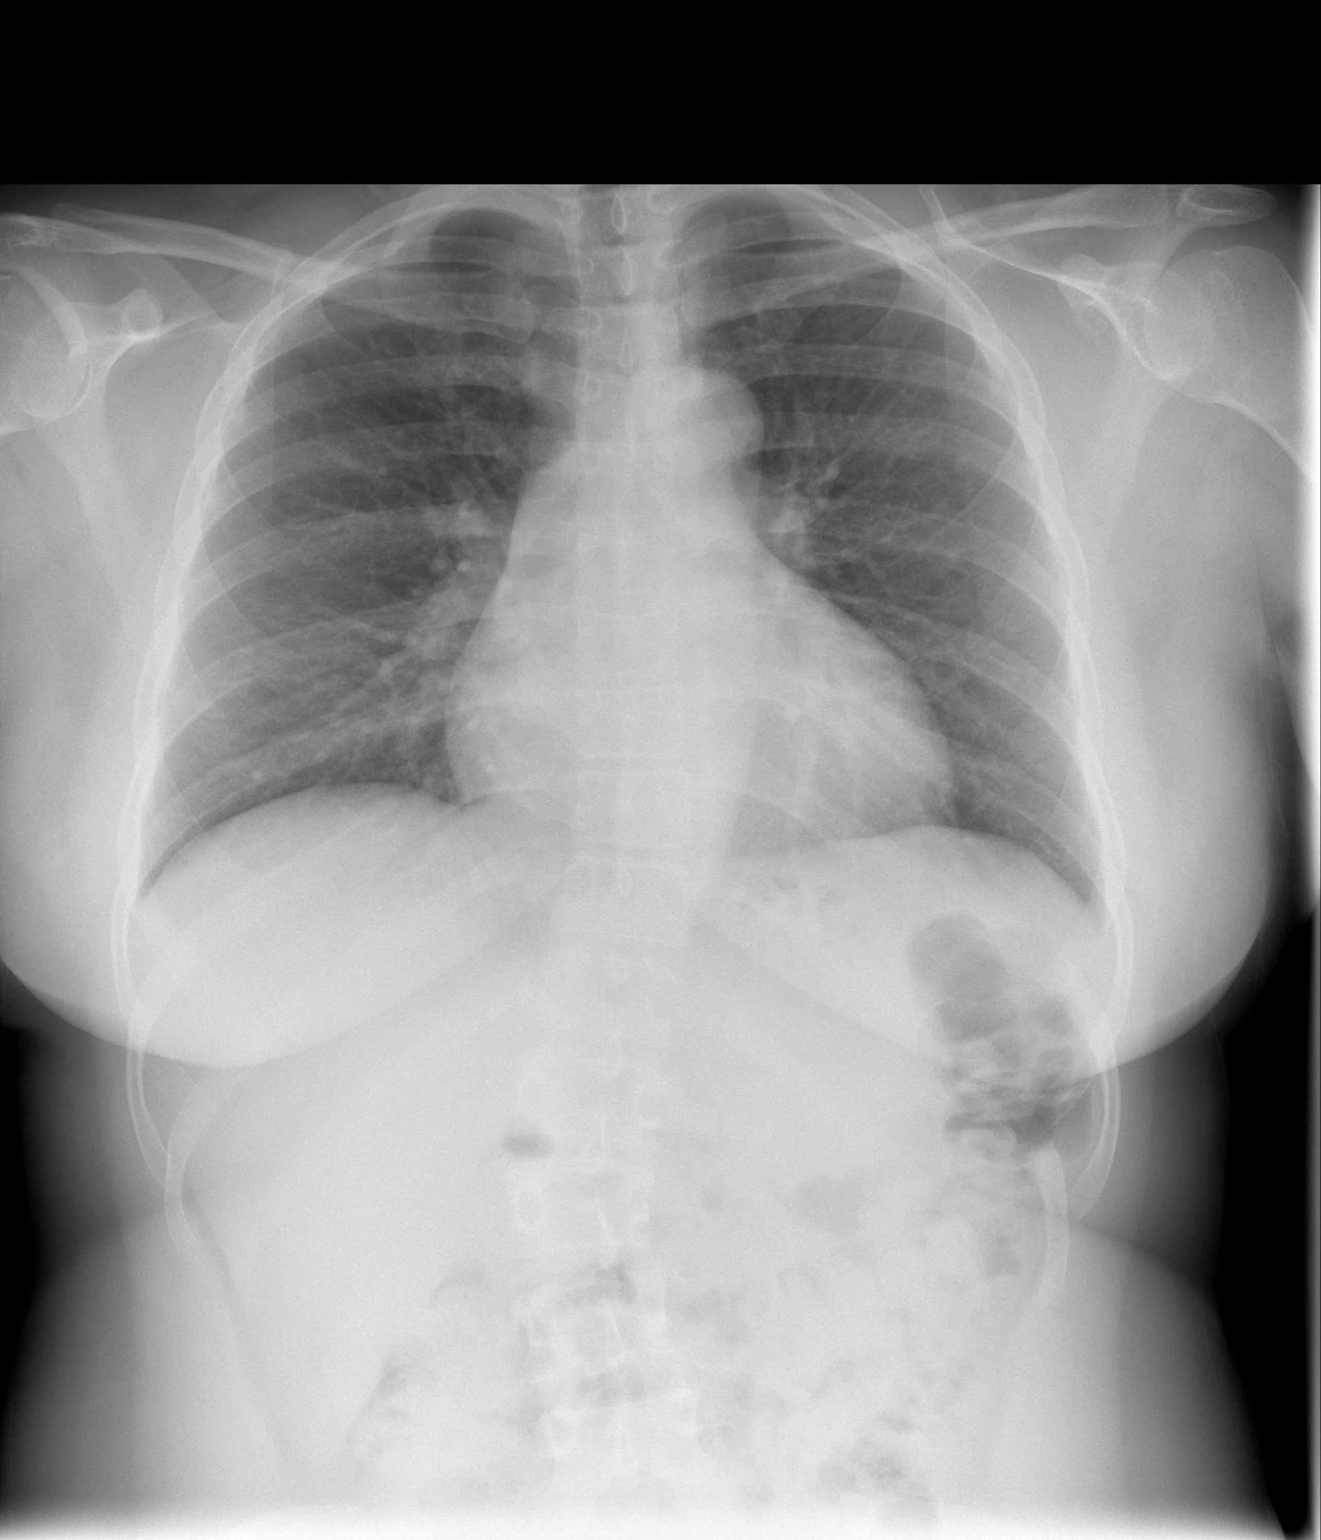

[w chest lat]
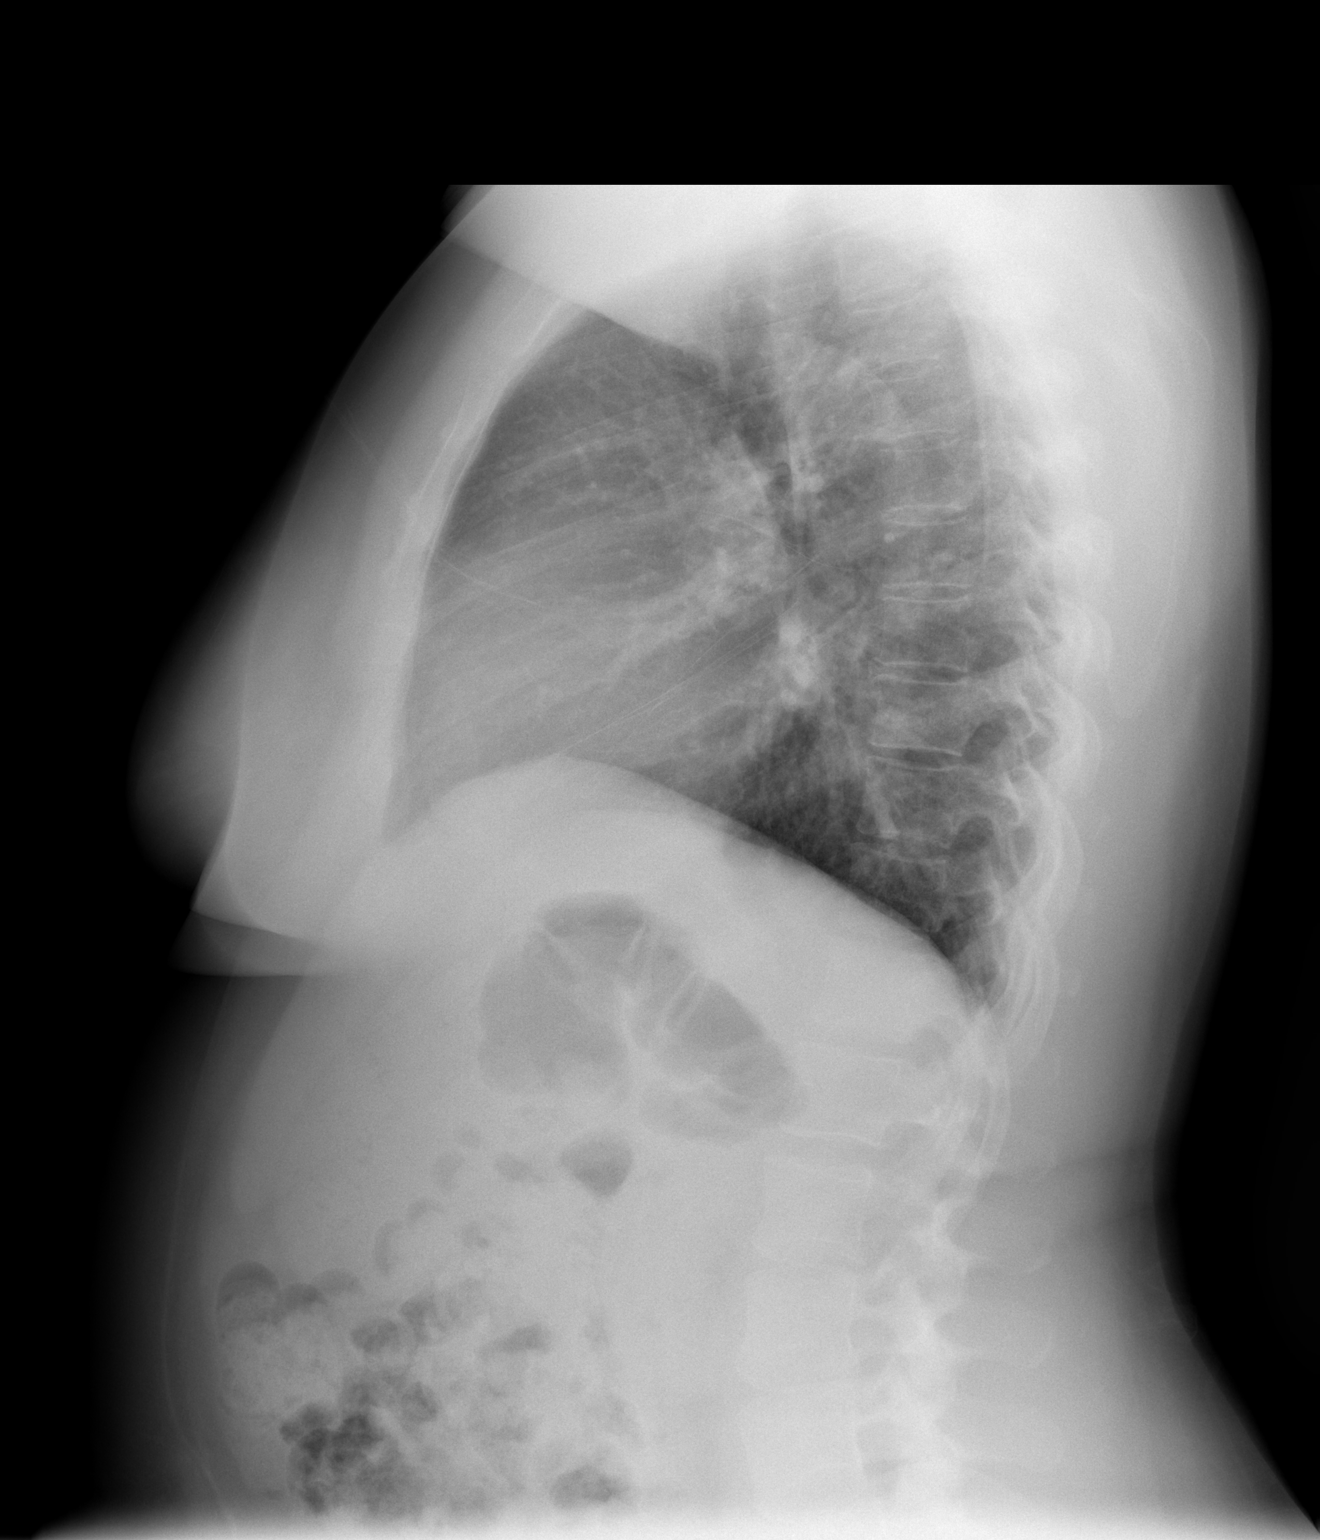

[2 of 2 positions shown; findings below may reference images not displayed]

FINDINGS: Cardiomediastinal silhouette is stable. No acute infiltrate or
pleural effusion. No pulmonary edema. Dextroscoliosis lumbar spine.
IMPRESSION: No active cardiopulmonary disease.

## 2013-12-11 MED ORDER — GUAIFENESIN-CODEINE 100-10 MG/5ML PO SYRP: 5.0000 mL | ORAL_SOLUTION | Freq: Three times a day (TID) | ORAL | Status: DC | PRN

## 2013-12-11 NOTE — ED Notes (Signed)
Reports dx with flu and started on tamiflu 12/19-states she is no better-cont'd cough-NAD at this time

## 2013-12-11 NOTE — ED Provider Notes (Signed)
[name]: [id number]     Arrival date & time 12/11/13  1337 History   First MD Initiated Contact with Patient 12/11/13 1354     Chief Complaint  Patient presents with  . Influenza   (Consider location/radiation/quality/duration/timing/severity/associated sxs/prior Treatment) HPI Comments: Pt was seen on 12/19. Pts daughter was diagnosed with flu so they treated the whole family:pt states that she has taken all the tamiflu, but she is continuing to have a really bad cough and pain in her chest with coughing:pt denies fever;having some nasal congestion  The history is provided by the patient. No language interpreter was used.    Past Medical History  Diagnosis Date  . Headache(784.0)     developed migraines after 2nd pregnancy, had a negative CT of the head  . Hypertension    Past Surgical History  Procedure Laterality Date  . Inguinal hernia repair      05/17/2003  . Cesarean section      [id number]  . Abdominal hysterectomy  11-14-07    no oophorectomy per surgical report   Family History  Problem Relation Age of Onset  . Diabetes Mother   . Hypertension Mother   . Glaucoma Mother   . Heart disease      2 uncles died May 17, 2011 from CAD-CHF  . Stroke Maternal Grandfather   . Colon cancer Mother     mother age 49  . Diabetes Other     siblings  . Hypertension Other     siblings   History  Substance Use Topics  . Smoking status: Never Smoker   . Smokeless tobacco: Never Used  . Alcohol Use: No   OB History   Grav Para Term Preterm Abortions TAB SAB Ect Mult Living   2 2             Review of Systems  Constitutional: Negative.  Negative for fever.  Respiratory: Positive for cough.   Cardiovascular: Positive for chest pain.    Allergies  Hydrocodone; Aspirin; Latex; Other; and Peanut-containing drug products  Home Medications   Current Outpatient Rx  Name  Route  Sig  Dispense  Refill  . Oseltamivir Phosphate (TAMIFLU PO)   Oral   Take by mouth.         . felodipine  (PLENDIL) 10 MG 24 hr tablet   Oral   Take 1 tablet (10 mg total) by mouth daily.   30 tablet   5   . losartan-hydrochlorothiazide (HYZAAR) 100-25 MG per tablet   Oral   Take 1 tablet by mouth daily.   30 tablet   6   . Multiple Vitamin (MULTIVITAMIN) tablet   Oral   Take 1 tablet by mouth 2 (two) times daily.           . nebivolol (BYSTOLIC) 10 MG tablet   Oral   Take 1 tablet (10 mg total) by mouth daily.   30 tablet   5   . penicillin v potassium (VEETID) 500 MG tablet   Oral   Take 1 tablet (500 mg total) by mouth 3 (three) times daily.   30 tablet   0   . zolpidem (AMBIEN) 10 MG tablet      TAKE 1 TABLET BY MOUTH AT BEDTIME AS NEEDED FOR SLEEP   30 tablet   5    BP 150/92  Pulse 90  Temp(Src) 98.7 F (37.1 C) (Oral)  Resp 16  Ht 4\' 11"  (1.499 m)  Wt 156 lb (70.761 kg)  BMI 31.49 kg/m2  SpO2 97% Physical Exam  Nursing note reviewed. Constitutional: She is oriented to person, place, and time. She appears well-developed and well-nourished.  HENT:  Right Ear: External ear normal.  Left Ear: External ear normal.  Mouth/Throat: Posterior oropharyngeal erythema present.  Eyes: Conjunctivae and EOM are normal.  Cardiovascular: Normal rate and regular rhythm.   Pulmonary/Chest: Effort normal and breath sounds normal.  Musculoskeletal: Normal range of motion.  Neurological: She is alert and oriented to person, place, and time.  Skin: Skin is warm and dry.    ED Course  Procedures (including critical care time) Labs Review Labs Reviewed - No data to display Imaging Review Dg Chest 2 View  12/11/2013   CLINICAL DATA:  Cough  EXAM: CHEST  2 VIEW  COMPARISON:  06/14/2009  FINDINGS: Cardiomediastinal silhouette is stable. No acute infiltrate or pleural effusion. No pulmonary edema. Dextroscoliosis lumbar spine.  IMPRESSION: No active cardiopulmonary disease.   Electronically Signed   By: Natasha Mead M.D.   On: 12/11/2013 14:18    EKG Interpretation    None       MDM   1. Cough    No pneumonia noted on xray:will treat symptomatically for the cough    Teressa Lower, NP 12/11/13 1424

## 2013-12-11 NOTE — Telephone Encounter (Signed)
UDS collected 09/05/13 LOW risk

## 2013-12-11 NOTE — ED Notes (Signed)
NP at bedside.

## 2013-12-16 NOTE — ED Provider Notes (Signed)
Medical screening examination/treatment/procedure(s) were performed by non-physician practitioner and as supervising physician I was immediately available for consultation/collaboration.  EKG Interpretation   None          Orpah Greek, MD 12/16/13 [phone]

## 2013-12-25 ENCOUNTER — Encounter: Payer: Self-pay | Admitting: Internal Medicine

## 2014-04-16 ENCOUNTER — Encounter: Payer: Self-pay | Admitting: Internal Medicine

## 2014-04-16 ENCOUNTER — Ambulatory Visit (INDEPENDENT_AMBULATORY_CARE_PROVIDER_SITE_OTHER): Payer: Managed Care, Other (non HMO) | Admitting: Internal Medicine

## 2014-04-16 VITALS — BP 176/116 | HR 78 | Temp 98.0°F | Wt 167.0 lb

## 2014-04-16 DIAGNOSIS — T23109A Burn of first degree of unspecified hand, unspecified site, initial encounter: Secondary | ICD-10-CM

## 2014-04-16 DIAGNOSIS — I10 Essential (primary) hypertension: Secondary | ICD-10-CM

## 2014-04-16 NOTE — Progress Notes (Signed)
Pre visit review using our clinic review tool, if applicable. No additional management support is needed unless otherwise documented below in the visit note. 

## 2014-04-16 NOTE — Patient Instructions (Signed)
Keep the area clean, dry, use Silvadene. Please come back in one week for a checkup.
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
## 2014-04-23 ENCOUNTER — Ambulatory Visit (INDEPENDENT_AMBULATORY_CARE_PROVIDER_SITE_OTHER): Payer: Managed Care, Other (non HMO) | Admitting: Internal Medicine

## 2014-04-23 ENCOUNTER — Encounter: Payer: Self-pay | Admitting: Internal Medicine

## 2014-04-23 VITALS — BP 165/101 | HR 86 | Temp 98.8°F | Wt 168.8 lb

## 2014-04-23 DIAGNOSIS — I10 Essential (primary) hypertension: Secondary | ICD-10-CM

## 2014-04-23 DIAGNOSIS — G4700 Insomnia, unspecified: Secondary | ICD-10-CM

## 2014-04-23 MED ORDER — ZOLPIDEM TARTRATE 10 MG PO TABS: ORAL_TABLET | ORAL | Status: DC

## 2014-04-23 MED ORDER — NEBIVOLOL HCL 20 MG PO TABS: 1.0000 | ORAL_TABLET | Freq: Every day | ORAL | Status: DC

## 2014-04-23 NOTE — Assessment & Plan Note (Addendum)
BP cont to be elevated despite good med compliance. Long term risks of elevated BP inncluding CAD and stroke discussed. I did encourage the patient again to eat healthy and stays active. Plan: Increase bystolic to 20 mg daily, self BP check, come back in 2 months

## 2014-04-23 NOTE — Patient Instructions (Signed)
Check the  blood pressure 2 or 3 times a   week be sure it is between 110/60 and 140/85. Ideal blood pressure is 120/80. If it is consistently higher or lower, let me know   Next visit in 2 months      Sodium-Controlled Diet Sodium is a mineral. It is found in many foods. Sodium may be found naturally or added during the making of a food. The most common form of sodium is salt, which is made up of sodium and chloride. Reducing your sodium intake involves changing your eating habits. The following guidelines will help you reduce the sodium in your diet:  Stop using the salt shaker.  Use salt sparingly in cooking and baking.  Substitute with sodium-free seasonings and spices.  Do not use a salt substitute (potassium chloride) without your caregiver's permission.  Include a variety of fresh, unprocessed foods in your diet.  Limit the use of processed and convenience foods that are high in sodium. USE THE FOLLOWING FOODS SPARINGLY: Breads/Starches  Commercial bread stuffing, commercial pancake or waffle mixes, coating mixes. Waffles. Croutons. Prepared (boxed or frozen) potato, rice, or noodle mixes that contain salt or sodium. Salted [place] fries or hash browns. Salted popcorn, breads, crackers, chips, or snack foods. Vegetables  Vegetables canned with salt or prepared in cream, butter, or cheese sauces. Sauerkraut. Tomato or vegetable juices canned with salt.  Fresh vegetables are allowed if rinsed thoroughly. Fruit  Fruit is okay to eat. Meat and Meat Substitutes  Salted or smoked meats, such as bacon or Canadian bacon, chipped or corned beef, hot dogs, salt pork, luncheon meats, pastrami, ham, or sausage. Canned or smoked fish, poultry, or meat. Processed cheese or cheese spreads, blue or Roquefort cheese. Battered or frozen fish products. Prepared spaghetti sauce. Baked beans. Reuben sandwiches. Salted nuts. Caviar. Milk  Limit buttermilk to 1 cup per week. Soups and  Combination Foods  Bouillon cubes, canned or dried soups, broth, consomm. Convenience (frozen or packaged) dinners with more than 600 mg sodium. Pot pies, pizza, Asian food, fast food cheeseburgers, and specialty sandwiches. Desserts and Sweets  Regular (salted) desserts, pie, commercial fruit snack pies, commercial snack cakes, canned puddings.  Eat desserts and sweets in moderation. Fats and Oils  Gravy mixes or canned gravy. No more than 1 to 2 tbs of salad dressing. Chip dips.  Eat fats and oils in moderation. Beverages  See those listed under the vegetables and milk groups. Condiments  Ketchup, mustard, meat sauces, salsa, regular (salted) and lite soy sauce or mustard. Dill pickles, olives, meat tenderizer. Prepared horseradish or pickle relish. Dutch-processed cocoa. Baking powder or baking soda used medicinally. Worcestershire sauce. "Light" salt. Salt substitute, unless approved by your caregiver. Document Released: 05/22/2002 Document Revised: 02/22/2012 Document Reviewed: 12/23/2009 [facility] Patient Information 2014 [place], [place].

## 2014-04-23 NOTE — Progress Notes (Signed)
Pre visit review using our clinic review tool, if applicable. No additional management support is needed unless otherwise documented below in the visit note. 

## 2014-04-23 NOTE — Assessment & Plan Note (Addendum)
Reluctant to take meds qhs but at the same time c/o not sleeping well. rec to go back on ambien prn, new rx sent
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
## 2014-05-23 ENCOUNTER — Other Ambulatory Visit: Payer: Self-pay | Admitting: *Deleted

## 2014-05-23 MED ORDER — NEBIVOLOL HCL 10 MG PO TABS
ORAL_TABLET | ORAL | Status: DC
Start: 2014-05-23 — End: 2014-07-19

## 2014-05-23 NOTE — Telephone Encounter (Signed)
Received fax from the pharmacy requesting drug change for Bystolic.  Note stated that the Bystolic 20mg  is not available.  Could we change this from to the 10mg  for twice a day?  Pt needs a new script ASAP.  She has been without this for a week.  Called and spoke with Estill Bamberg with [facility]) and informed her that Dr. Larose Kells is sending in a new rx for Bystolic 10mg  2 po qd #60 w/12 refills.  New rx was phoned in.//AB/CMA

## 2014-05-24 ENCOUNTER — Telehealth: Payer: Self-pay | Admitting: *Deleted

## 2014-05-24 DIAGNOSIS — B3731 Acute candidiasis of vulva and vagina: Secondary | ICD-10-CM

## 2014-05-24 DIAGNOSIS — B373 Candidiasis of vulva and vagina: Secondary | ICD-10-CM

## 2014-05-24 NOTE — Telephone Encounter (Signed)
Caller name:  [place] Relation to pt:  self Call back number: [phone]  Pharmacy:  [facility] at [address].  Reason for call:   Pt went out of town this weekend and had to have a tooth removed.  She was put on antibiotic, has given her a yeast infection.  Can you call her something in or does she have to have appt?

## 2014-05-25 MED ORDER — FLUCONAZOLE 150 MG PO TABS: 150.0000 mg | ORAL_TABLET | Freq: Once | ORAL | Status: DC

## 2014-05-25 NOTE — Telephone Encounter (Signed)
Pt is calling again regarding this.  Pt needs before she goes back out of town. Thanks

## 2014-05-25 NOTE — Telephone Encounter (Signed)
ok Diflucan 150 mg one by mouth daily #2 no refills

## 2014-05-25 NOTE — Telephone Encounter (Signed)
Rx for Diflucan sent to [facility] on [address], spoke with patient and made her aware.

## 2014-07-18 ENCOUNTER — Telehealth: Payer: Self-pay | Admitting: *Deleted

## 2014-07-18 ENCOUNTER — Encounter (HOSPITAL_BASED_OUTPATIENT_CLINIC_OR_DEPARTMENT_OTHER): Payer: Self-pay | Admitting: Emergency Medicine

## 2014-07-18 ENCOUNTER — Emergency Department (HOSPITAL_BASED_OUTPATIENT_CLINIC_OR_DEPARTMENT_OTHER)
Admission: EM | Admit: 2014-07-18 | Discharge: 2014-07-18 | Disposition: A | Discharge status: Home / Self Care | Payer: Managed Care, Other (non HMO) | Attending: Emergency Medicine | Admitting: Emergency Medicine

## 2014-07-18 ENCOUNTER — Emergency Department (HOSPITAL_BASED_OUTPATIENT_CLINIC_OR_DEPARTMENT_OTHER): Payer: Managed Care, Other (non HMO)

## 2014-07-18 DIAGNOSIS — R519 Headache, unspecified: Secondary | ICD-10-CM

## 2014-07-18 DIAGNOSIS — Z79899 Other long term (current) drug therapy: Secondary | ICD-10-CM | POA: Insufficient documentation

## 2014-07-18 DIAGNOSIS — Z91040 Latex allergy status: Secondary | ICD-10-CM | POA: Insufficient documentation

## 2014-07-18 DIAGNOSIS — H53149 Visual discomfort, unspecified: Secondary | ICD-10-CM | POA: Insufficient documentation

## 2014-07-18 DIAGNOSIS — R51 Headache: Secondary | ICD-10-CM | POA: Insufficient documentation

## 2014-07-18 DIAGNOSIS — I10 Essential (primary) hypertension: Secondary | ICD-10-CM

## 2014-07-18 IMAGING — CT CT HEAD W/O CM
1 series · 16 of 30 positions shown, 20 images · non-contrast
Comparison: [DATE]

CLINICAL DATA: Headache, occipital and frontal regions

EXAM:
CT HEAD WITHOUT CONTRAST
TECHNIQUE: Contiguous axial images were obtained from the base of the skull
through the vertex without intravenous contrast.

[Series 2: head 4.8 h37s · axial · 0.45mm/px · z∈[-41,+91]mm · 16 of 31 slices shown, 20 images]
[im 2/31  brain]
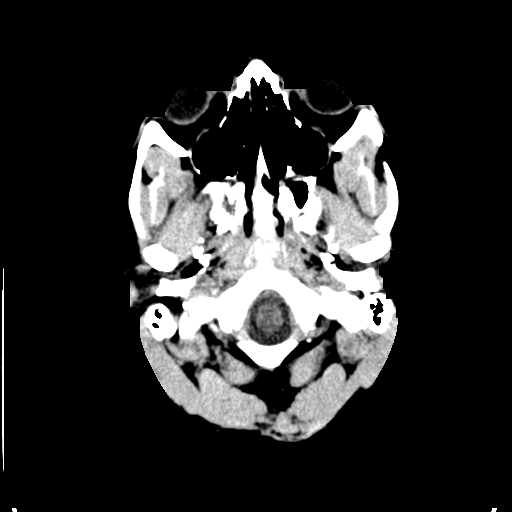
[im 2/31  bone]
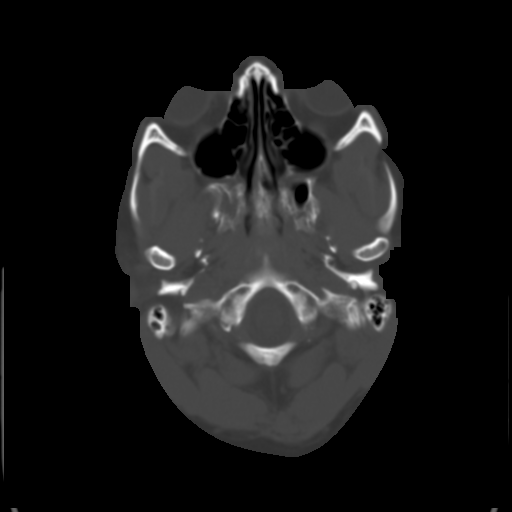
[im 4/31  brain]
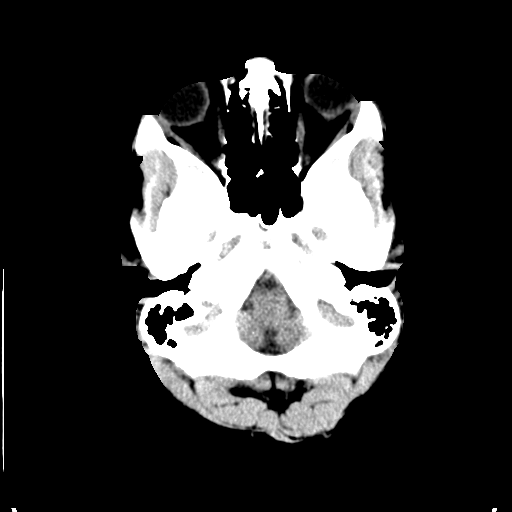
[im 6/31  brain]
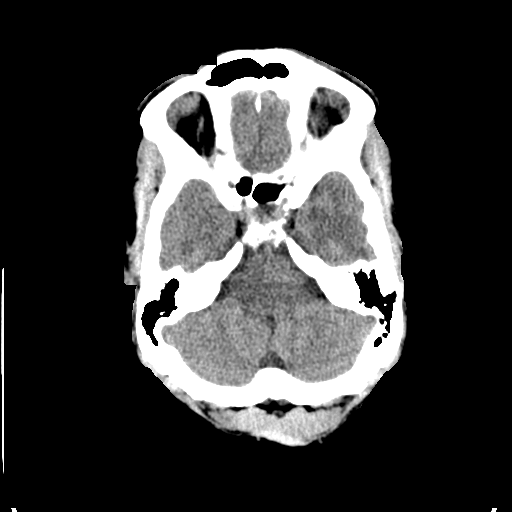
[im 8/31  brain]
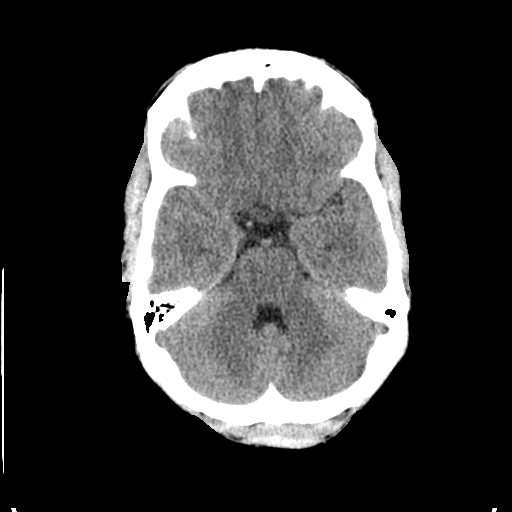
[im 9/31  brain]
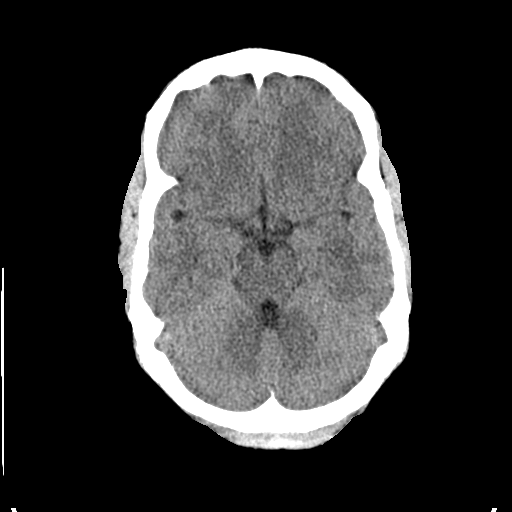
[im 9/31  bone]
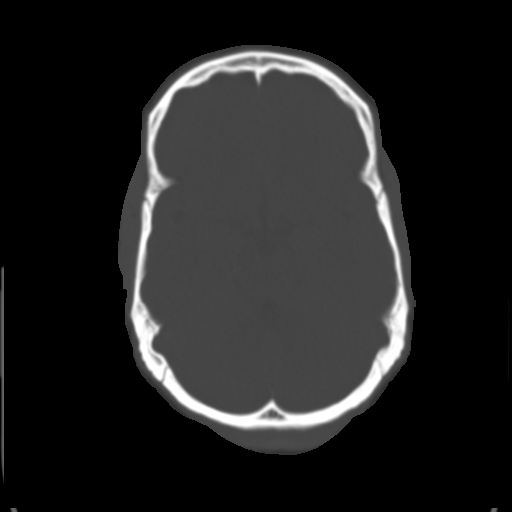
[im 11/31  brain]
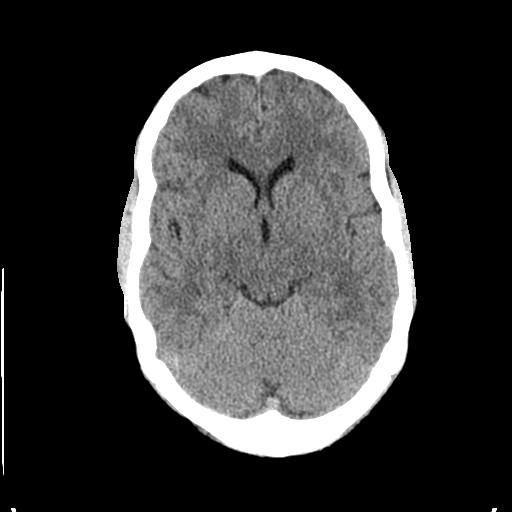
[im 13/31  brain]
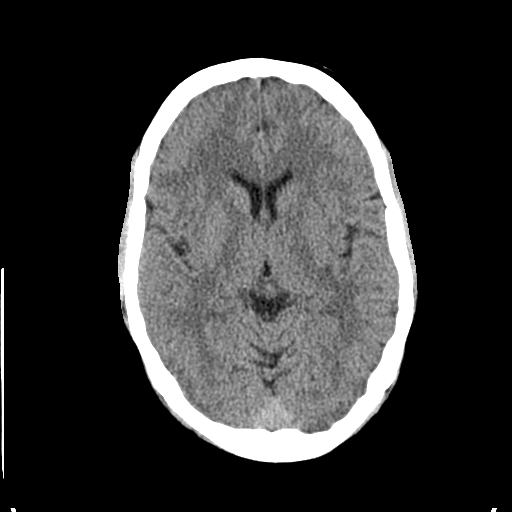
[im 15/31  brain]
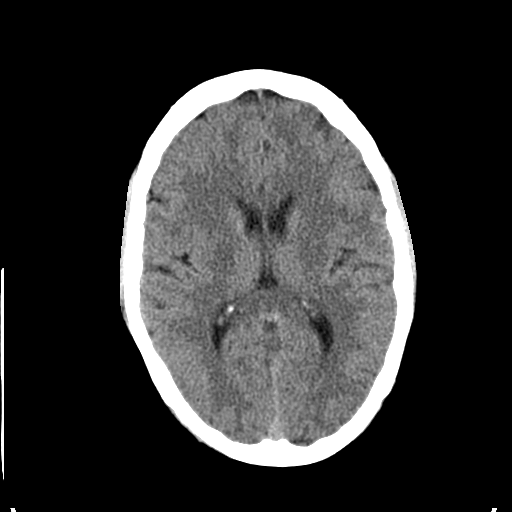
[im 16/31  brain]
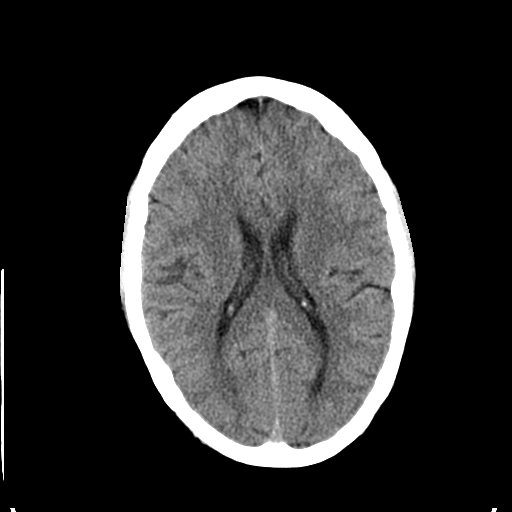
[im 16/31  bone]
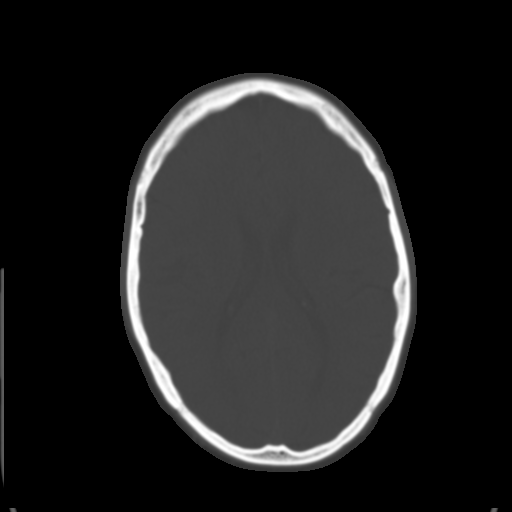
[im 18/31  brain]
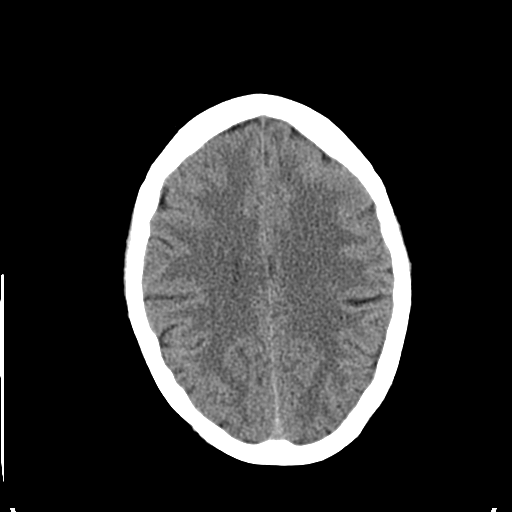
[im 20/31  brain]
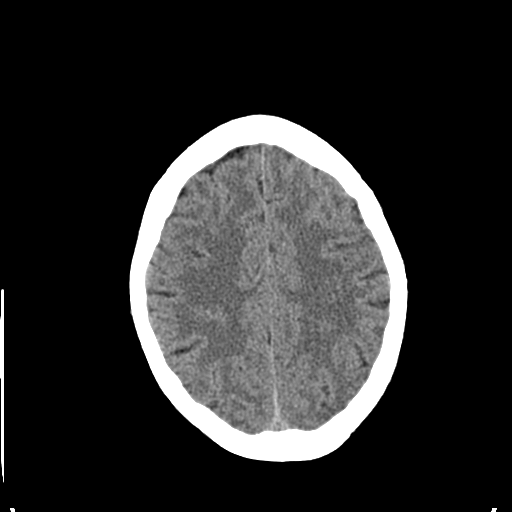
[im 22/31  brain]
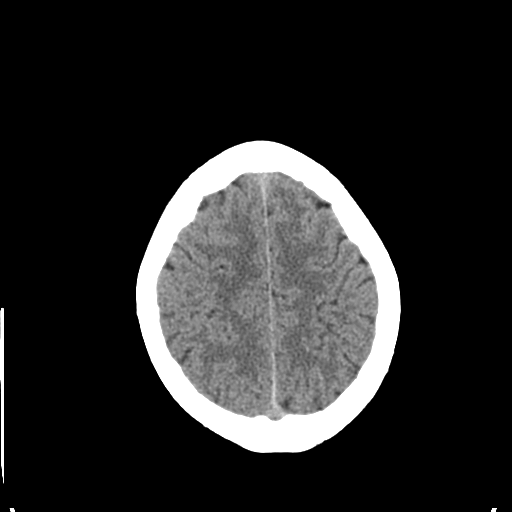
[im 23/31  brain]
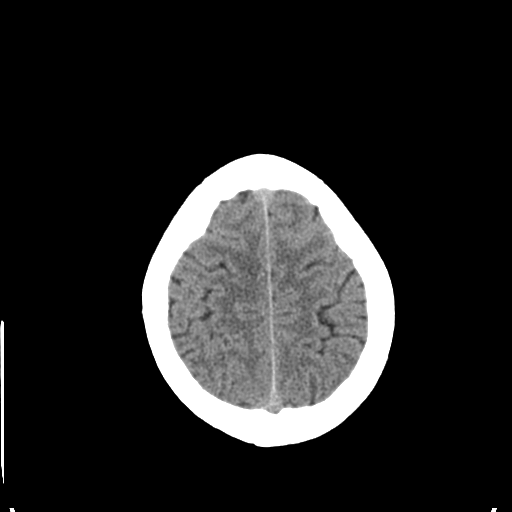
[im 23/31  bone]
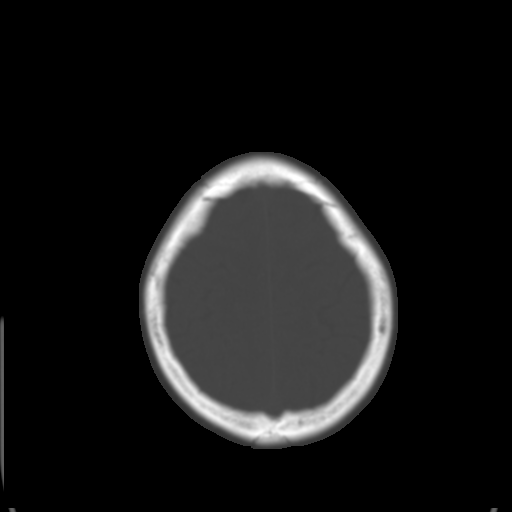
[im 25/31  brain]
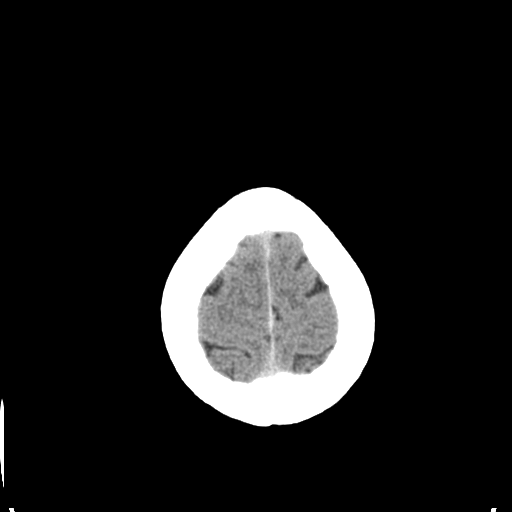
[im 27/31  brain]
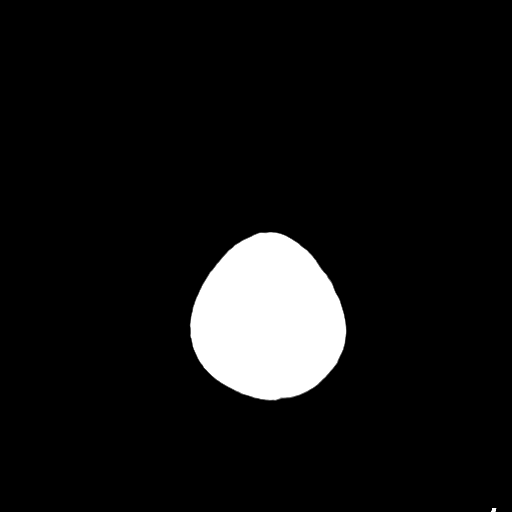
[im 29/31  brain]
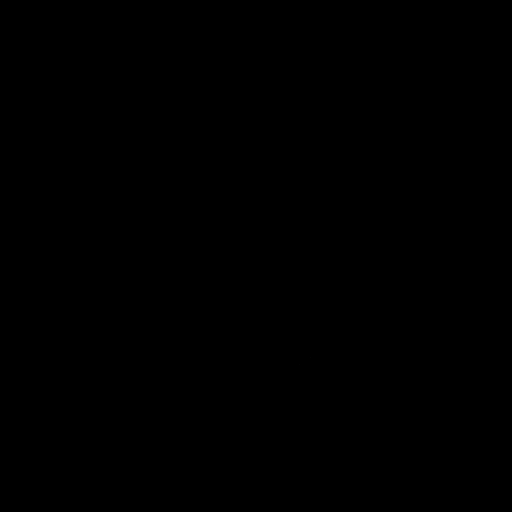

[16 of 30 positions shown; findings below may reference images not displayed]

FINDINGS: No acute hemorrhage, infarct, or mass lesion is identified. No
midline shift. Ventricles are normal in size. Orbits and paranasal
sinuses are unremarkable. No skull fracture.
IMPRESSION: No acute intracranial abnormality.

## 2014-07-18 MED ORDER — METOCLOPRAMIDE HCL 5 MG/ML IJ SOLN
10.0000 mg | Freq: Once | INTRAMUSCULAR | Status: AC
Start: 2014-07-18 — End: 2014-07-18
  Administered 2014-07-18: 10 mg via INTRAVENOUS
  Filled 2014-07-18: qty 2

## 2014-07-18 MED ORDER — SODIUM CHLORIDE 0.9 % IV BOLUS (SEPSIS)
1000.0000 mL | Freq: Once | INTRAVENOUS | Status: AC
  Administered 2014-07-18: 1000 mL via INTRAVENOUS

## 2014-07-18 MED ORDER — HYDRALAZINE HCL 20 MG/ML IJ SOLN
10.0000 mg | Freq: Once | INTRAMUSCULAR | Status: AC
  Administered 2014-07-18: 10 mg via INTRAVENOUS
  Filled 2014-07-18: qty 1

## 2014-07-18 MED ORDER — DIPHENHYDRAMINE HCL 50 MG/ML IJ SOLN
25.0000 mg | Freq: Once | INTRAMUSCULAR | Status: AC
  Administered 2014-07-18: 25 mg via INTRAVENOUS
  Filled 2014-07-18: qty 1

## 2014-07-18 NOTE — ED Notes (Addendum)
Pt sts that she recently started on Metformin on Monday and that's when the HA started. Reports that it feels different than previous HAs.  Pt reports that her BP hasn't been elevated recently.  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

## 2014-07-18 NOTE — Discharge Instructions (Signed)

## 2014-07-18 NOTE — ED Notes (Signed)
Patient ambulates to restroom & back to room without difficulty or assistance.

## 2014-07-18 NOTE — ED Provider Notes (Signed)
[name]: [id number]     Arrival date & time 07/18/14  1422 History   First MD Initiated Contact with Patient 07/18/14 1457     Chief Complaint  Patient presents with  . Headache     (Consider location/radiation/quality/duration/timing/severity/associated sxs/prior Treatment) HPI Comments: Gradual onset headache x 3 days.  Started at the top of her head, thought is might be due to "sleeping wrong" but progressed to front and sides of head.  Denies thunderclap onset.  Denies nausea, vomiting, fever, weakness, numbness, tingling.  No chest pain or SOB.  No visual change.  Some photophobia.  Hx of "migraines" but this is more severe.  Has not taken any meds at home.  Started vitamin D and metformin on Mar 18, 2023 and concerned that this might be related. States compliance with BP meds.  Hypertensive here.  BP 130s at home per patient.  The history is provided by the patient.    Past Medical History  Diagnosis Date  . Headache(784.0)     developed migraines after 2nd pregnancy, had a negative CT of the head  . Hypertension    Past Surgical History  Procedure Laterality Date  . Inguinal hernia repair      2003-03-18  . Cesarean section      [id number]  . Abdominal hysterectomy  11-14-07    no oophorectomy per surgical report   Family History  Problem Relation Age of Onset  . Diabetes Mother   . Hypertension Mother   . Glaucoma Mother   . Heart disease      2 uncles died Mar 18, 2011 from CAD-CHF  . Stroke Maternal Grandfather   . Colon cancer Mother     mother age 56  . Diabetes Other     siblings  . Hypertension Other     siblings   History  Substance Use Topics  . Smoking status: Never Smoker   . Smokeless tobacco: Never Used  . Alcohol Use: No   OB History   Grav Para Term Preterm Abortions TAB SAB Ect Mult Living   2 2             Review of Systems  Constitutional: Negative for fever, activity change and appetite change.  Eyes: Positive for photophobia. Negative for visual disturbance.   Respiratory: Negative for cough, chest tightness and shortness of breath.   Cardiovascular: Negative for chest pain.  Gastrointestinal: Negative for nausea, vomiting and abdominal pain.  Genitourinary: Negative for dysuria, hematuria, vaginal bleeding and vaginal discharge.  Musculoskeletal: Negative for arthralgias and myalgias.  Skin: Negative for rash.  Neurological: Positive for headaches. Negative for dizziness, weakness, light-headedness and numbness.  A complete 10 system review of systems was obtained and all systems are negative except as noted in the HPI and PMH.      Allergies  Hydrocodone; Aspirin; Latex; Other; Peanut butter flavor; and Peanut-containing drug products  Home Medications   Prior to Admission medications   Medication Sig Start Date End Date Taking? Authorizing Provider  Cholecalciferol (VITAMIN D PO) Take by mouth.   Yes Historical Provider, MD  METFORMIN HCL PO Take by mouth.   Yes Historical Provider, MD  felodipine (PLENDIL) 10 MG 24 hr tablet Take 1 tablet (10 mg total) by mouth daily. 03/10/13   Colon Branch, MD  fluconazole (DIFLUCAN) 150 MG tablet Take 1 tablet (150 mg total) by mouth once. 05/25/14   Colon Branch, MD  losartan-hydrochlorothiazide (HYZAAR) 100-25 MG per tablet Take 1 tablet by mouth daily. 07/12/13  Colon Branch, MD  Multiple Vitamin (MULTIVITAMIN) tablet Take 1 tablet by mouth 2 (two) times daily.      Historical Provider, MD  nebivolol (BYSTOLIC) 10 MG tablet TAKE 2 TABLETS BY MOUTH EVERY DAY. 05/23/14   Colon Branch, MD  zolpidem (AMBIEN) 10 MG tablet TAKE 1 TABLET BY MOUTH AT BEDTIME AS NEEDED FOR SLEEP 04/23/14   Colon Branch, MD   BP 160/84  Pulse 84  Temp(Src) 99 F (37.2 C) (Oral)  Resp 12  Ht 4\' 11"  (1.499 m)  Wt 160 lb (72.576 kg)  BMI 32.30 kg/m2  SpO2 100% Physical Exam  Nursing note and vitals reviewed. Constitutional: She is oriented to person, place, and time. She appears well-developed and well-nourished. No distress.   HENT:  Head: Normocephalic and atraumatic.  Mouth/Throat: Oropharynx is clear and moist. No oropharyngeal exudate.  No temporal artery tenderness  Eyes: Conjunctivae and EOM are normal. Pupils are equal, round, and reactive to light.  Unable to visualize fundi  Neck: Normal range of motion. Neck supple.  No meningismus.  Cardiovascular: Normal rate, regular rhythm, normal heart sounds and intact distal pulses.   No murmur heard. Pulmonary/Chest: Effort normal and breath sounds normal. No respiratory distress.  Abdominal: Soft. There is no tenderness. There is no rebound and no guarding.  Musculoskeletal: Normal range of motion. She exhibits no edema and no tenderness.  Neurological: She is alert and oriented to person, place, and time. No cranial nerve deficit. She exhibits normal muscle tone. Coordination normal.  No ataxia on finger to nose bilaterally. No pronator drift. 5/5 strength throughout. CN 2-12 intact. Negative Romberg. Equal grip strength. Sensation intact. Gait is normal.   Skin: Skin is warm.  Psychiatric: She has a normal mood and affect. Her behavior is normal.    ED Course  Procedures (including critical care time) Labs Review Labs Reviewed - No data to display  Imaging Review Ct Head Wo Contrast  07/18/2014   CLINICAL DATA:  Headache, occipital and frontal regions  EXAM: CT HEAD WITHOUT CONTRAST  TECHNIQUE: Contiguous axial images were obtained from the base of the skull through the vertex without intravenous contrast.  COMPARISON:  04/11/2013  FINDINGS: No acute hemorrhage, infarct, or mass lesion is identified. No midline shift. Ventricles are normal in size. Orbits and paranasal sinuses are unremarkable. No skull fracture.  IMPRESSION: No acute intracranial abnormality.   Electronically Signed   By: Conchita Paris M.D.   On: 07/18/2014 15:39     EKG Interpretation None      MDM   Final diagnoses:  Essential hypertension  Headache, unspecified headache  type  Gradual onset headache, no weakness, numbness, tingling, fever, visual change.  Neuro intact.  Elevated BP may be contributing.  Metformin and vitamin D are new meds. No meningismus.  CT head negative. Nonfocal neurological exam. Blood pressure improved to 160/80 after hydralazine.  Headache has improved with Benadryl and Reglan. She declines anything additional for headache. She is tolerating by mouth and ambulatory.  Advised patient to keep a record of blood pressure at home. She states she has a appointment with her PCP tomorrow. Return precautions discussed.   Ezequiel Essex, MD 07/18/14 [phone]

## 2014-07-18 NOTE — ED Notes (Signed)
C/o HA since 8/3

## 2014-07-18 NOTE — ED Notes (Signed)
Patient transported to CT 

## 2014-07-19 ENCOUNTER — Ambulatory Visit (INDEPENDENT_AMBULATORY_CARE_PROVIDER_SITE_OTHER): Payer: Managed Care, Other (non HMO) | Admitting: Medical

## 2014-07-19 VITALS — BP 162/100 | HR 90 | Temp 98.2°F | Wt 165.0 lb

## 2014-07-19 DIAGNOSIS — R51 Headache: Secondary | ICD-10-CM

## 2014-07-19 DIAGNOSIS — I10 Essential (primary) hypertension: Secondary | ICD-10-CM

## 2014-07-19 LAB — BASIC METABOLIC PANEL
BUN: 9 mg/dL (ref 6–23)
CO2: 24 mEq/L (ref 19–32)
Calcium: 9.5 mg/dL (ref 8.4–10.5)
Chloride: 101 mEq/L (ref 96–112)
Creatinine, Ser: 1 mg/dL (ref 0.4–1.2)
GFR: 83.49 mL/min (ref >60.00)
Glucose, Bld: 99 mg/dL (ref 70–99)
Potassium: 3.4 mEq/L — ABNORMAL LOW (ref 3.5–5.1)
Sodium: 133 mEq/L — ABNORMAL LOW (ref 135–145)

## 2014-07-19 MED ORDER — NEBIVOLOL HCL 20 MG PO TABS: 20.0000 mg | ORAL_TABLET | Freq: Two times a day (BID) | ORAL | Status: DC

## 2014-07-19 NOTE — Progress Notes (Signed)
Pre visit review using our clinic review tool, if applicable. No additional management support is needed unless otherwise documented below in the visit note. 

## 2014-07-19 NOTE — Assessment & Plan Note (Signed)
Ct of head neg yesterday at ED. Neuro exam negative today. I think bp is likley cause of ha. Minimal presently. Could use low dose ibuprofen 200 mg for occasional ha.

## 2014-07-19 NOTE — Assessment & Plan Note (Signed)
I did prescribe new dose 20 mg to take 2 tab po q day. Continue plendil and hyzaar as well. See avs for overall visit instructions.

## 2014-07-19 NOTE — Patient Instructions (Signed)
I am increasing your bystolic to 40 mg every day. Continue your other bp medications. Please check bp daily with your cuff and call us in 2 wks with update. If your bp is not coming down call us sooner.(Would like to see 130/80) If headache with neurologic signs or symptoms as discussed then ED evaluation. Please get labs today in our office. Follow up in 1 month or as needed.

## 2014-07-19 NOTE — Progress Notes (Signed)
Subjective:    Patient ID: Abigail Campos, female    DOB: 03/05/1971, 43 y.o.   MRN: [id number]  HPI  Pt states ha since 03/31/23. Ha not as bad as it was yesterday. Pt states went to ED for ha since no appointment available yesterday. In ED she had CT of her head and was negative. Pt given med for ha and ha is diminished today but still present mildly/slightly. Level 3-4 HA today. No bp med changes made. They told her to come in. Presently no vision changes. NO slurrred speech, no dizziness, no loss of balance. No gross motor or sensory function deficits. No chest pain. LMP-hysterectomy.   Pt bp has been elevated since May per record. Yesterday in ED her bp was 189/110. Then later dropped to 150/90.  Bp when I checked was 170/90 Rt arm sitting. ESPAC.    Past Medical History  Diagnosis Date  . Headache(784.0)     developed migraines after 2nd pregnancy, had a negative CT of the head  . Hypertension     History   Social History  . Marital Status: Married    Spouse Name: N/A    Number of Children: 2  . Years of Education: N/A   Occupational History  . TEACHER    Social History Main Topics  . Smoking status: Never Smoker   . Smokeless tobacco: Never Used  . Alcohol Use: No  . Drug Use: No  . Sexual Activity: Not on file   Other Topics Concern  . Not on file   Social History Narrative  . No narrative on file    Past Surgical History  Procedure Laterality Date  . Inguinal hernia repair      03/31/03  . Cesarean section      [id number]  . Abdominal hysterectomy  11-14-07    no oophorectomy per surgical report    Family History  Problem Relation Age of Onset  . Diabetes Mother   . Hypertension Mother   . Glaucoma Mother   . Heart disease      2 uncles died 03-31-11 from CAD-CHF  . Stroke Maternal Grandfather   . Colon cancer Mother     mother age 107  . Diabetes Other     siblings  . Hypertension Other     siblings    Allergies  Allergen Reactions  .  Hydrocodone Hives    Generalize itching w/o rash  . Aspirin Swelling    Reports blisters w/ ASA but pt reports is ok w/ Motrin, Advil, naproxen  . Latex Hives  . Other     Bee stings  . Peanut Butter Flavor Swelling    Throat swells  . Peanut-Containing Drug Products     Current Outpatient Prescriptions on File Prior to Visit  Medication Sig Dispense Refill  . Cholecalciferol (VITAMIN D PO) Take by mouth.      . felodipine (PLENDIL) 10 MG 24 hr tablet Take 1 tablet (10 mg total) by mouth daily.  30 tablet  5  . fluconazole (DIFLUCAN) 150 MG tablet Take 1 tablet (150 mg total) by mouth once.  1 tablet  0  . losartan-hydrochlorothiazide (HYZAAR) 100-25 MG per tablet Take 1 tablet by mouth daily.  30 tablet  6  . METFORMIN HCL PO Take by mouth.      . Multiple Vitamin (MULTIVITAMIN) tablet Take 1 tablet by mouth 2 (two) times daily.        Marland Kitchen zolpidem (AMBIEN) 10 MG tablet  TAKE 1 TABLET BY MOUTH AT BEDTIME AS NEEDED FOR SLEEP  30 tablet  5   No current facility-administered medications on file prior to visit.    BP 162/100  Pulse 90  Temp(Src) 98.2 F (36.8 C)  Wt 165 lb (74.844 kg)  SpO2 97%   Review of Systems  Constitutional: Negative for fever and chills.  HENT: Negative.   Respiratory: Negative for cough, chest tightness and wheezing.   Cardiovascular: Negative for chest pain and palpitations.  Neurological: Negative for dizziness, seizures, syncope, facial asymmetry, weakness, light-headedness, numbness and headaches.  Psychiatric/Behavioral: Negative.        Objective:   Physical Exam  General Mental Status- Alert. General Appearance- Not in acute distress.   Skin General: Color- Normal Color. Moisture- Normal Moisture.  Neck Carotid Arteries- Normal color. Moisture- Normal Moisture. No carotid bruits. No JVD.  Chest and Lung Exam Auscultation: Breath Sounds:-Normal.  Cardiovascular Auscultation:Rythm- Regular. Murmurs & Other Heart Sounds:Auscultation  of the heart reveals- No Murmurs.    Neurologic Cranial Nerve exam:- CN III-XII intact(No nystagmus), symmetric smile. Drift Test:- No drift. Romberg Exam:- Negative.  Heal to Toe Gait exam:-Normal. Finger to Nose:- Normal/Intact Strength:- 5/5 equal and symmetric strength both upper and lower extremities.       Assessment & Plan:

## 2014-07-20 ENCOUNTER — Telehealth: Payer: Self-pay | Admitting: Internal Medicine

## 2014-07-20 NOTE — Telephone Encounter (Signed)
Relevant patient education assigned to patient using Emmi. ° °

## 2014-08-02 ENCOUNTER — Encounter: Payer: Self-pay | Admitting: Medical

## 2014-08-02 NOTE — Addendum Note (Signed)
Addended by: Peggyann Shoals on: 08/02/2014 05:04 PM   Modules accepted: Orders

## 2014-09-03 ENCOUNTER — Other Ambulatory Visit (INDEPENDENT_AMBULATORY_CARE_PROVIDER_SITE_OTHER): Payer: Managed Care, Other (non HMO)

## 2014-09-03 DIAGNOSIS — I10 Essential (primary) hypertension: Secondary | ICD-10-CM

## 2014-09-03 LAB — COMPREHENSIVE METABOLIC PANEL
ALT: 24 U/L (ref 0–35)
AST: 21 U/L (ref 0–37)
Albumin: 3.8 g/dL (ref 3.5–5.2)
Alkaline Phosphatase: 71 U/L (ref 39–117)
BUN: 9 mg/dL (ref 6–23)
CO2: 29 mEq/L (ref 19–32)
Calcium: 9.1 mg/dL (ref 8.4–10.5)
Chloride: 104 mEq/L (ref 96–112)
Creatinine, Ser: 0.8 mg/dL (ref 0.4–1.2)
GFR: 104.76 mL/min (ref >60.00)
Glucose, Bld: 98 mg/dL (ref 70–99)
Potassium: 3.9 mEq/L (ref 3.5–5.1)
Sodium: 140 mEq/L (ref 135–145)
Total Bilirubin: 0.2 mg/dL (ref 0.2–1.2)
Total Protein: 7.9 g/dL (ref 6.0–8.3)

## 2014-09-11 ENCOUNTER — Telehealth: Payer: Self-pay

## 2014-09-11 NOTE — Telephone Encounter (Signed)
UDS: 09/03/2014   Negative for Ambien: PRN   Low risk per Dr. Larose Kells 09/11/2014

## 2014-10-01 NOTE — Telephone Encounter (Signed)
error 

## 2014-10-15 ENCOUNTER — Encounter: Payer: Self-pay | Admitting: Medical

## 2014-10-17 ENCOUNTER — Encounter: Payer: Self-pay | Admitting: Internal Medicine

## 2014-10-17 ENCOUNTER — Telehealth: Payer: Self-pay | Admitting: *Deleted

## 2014-10-17 ENCOUNTER — Ambulatory Visit (INDEPENDENT_AMBULATORY_CARE_PROVIDER_SITE_OTHER): Payer: Managed Care, Other (non HMO) | Admitting: Internal Medicine

## 2014-10-17 VITALS — BP 128/72 | HR 58 | Temp 98.2°F | Wt 163.2 lb

## 2014-10-17 DIAGNOSIS — Z299 Encounter for prophylactic measures, unspecified: Secondary | ICD-10-CM

## 2014-10-17 DIAGNOSIS — Z23 Encounter for immunization: Secondary | ICD-10-CM

## 2014-10-17 DIAGNOSIS — Z1159 Encounter for screening for other viral diseases: Secondary | ICD-10-CM

## 2014-10-17 DIAGNOSIS — Z418 Encounter for other procedures for purposes other than remedying health state: Secondary | ICD-10-CM

## 2014-10-17 DIAGNOSIS — I10 Essential (primary) hypertension: Secondary | ICD-10-CM

## 2014-10-17 DIAGNOSIS — Z0000 Encounter for general adult medical examination without abnormal findings: Secondary | ICD-10-CM

## 2014-10-17 DIAGNOSIS — J209 Acute bronchitis, unspecified: Secondary | ICD-10-CM

## 2014-10-17 MED ORDER — BENZONATATE 200 MG PO CAPS: 200.0000 mg | ORAL_CAPSULE | Freq: Two times a day (BID) | ORAL | Status: DC | PRN

## 2014-10-17 MED ORDER — AZITHROMYCIN 250 MG PO TABS: ORAL_TABLET | ORAL | Status: DC

## 2014-10-17 NOTE — Assessment & Plan Note (Signed)
Well-controlled, continue with plendin, Hyzaar and bystolic Recent BMP normal Next visit in 3-4 months for a physical

## 2014-10-17 NOTE — Progress Notes (Signed)
Subjective:    Patient ID: Abigail Campos, female    DOB: 1971/08/10, 43 y.o.   MRN: [id number]  DOS:  10/17/2014 Type of visit - description : acute Interval history: ---3 weeks history of cough, not getting better, not taking any specific medication ---Also needs a ppd , Tdap etc,  see physical exam ---Hypertension, good medication compliance, ambulatory BPs have improve, she has lost 5 pounds, doing better with diet and exercise    ROS Denies fever, chills. No runny nose or sore throat No sinus pain or congestion No GERD symptoms Cough only produces occasional sputum.  Past Medical History  Diagnosis Date  . Headache(784.0)     developed migraines after 2nd pregnancy, had a negative CT of the head  . Hypertension     Past Surgical History  Procedure Laterality Date  . Inguinal hernia repair      2004  . Cesarean section      [id number]  . Abdominal hysterectomy  11-14-07    no oophorectomy per surgical report    History   Social History  . Marital Status: Married    Spouse Name: N/A    Number of Children: 2  . Years of Education: N/A   Occupational History  . TEACHER    Social History Main Topics  . Smoking status: Never Smoker   . Smokeless tobacco: Never Used  . Alcohol Use: No  . Drug Use: No  . Sexual Activity: Not on file   Other Topics Concern  . Not on file   Social History Narrative        Medication List       This list is accurate as of: 10/17/14 11:59 PM.  Always use your most recent med list.               azithromycin 250 MG tablet  Commonly known as:  ZITHROMAX Z-PAK  2 tabs a day the first day, then 1 tab a day x 4 days     benzonatate 200 MG capsule  Commonly known as:  TESSALON  Take 1 capsule (200 mg total) by mouth 2 (two) times daily as needed for cough.     felodipine 10 MG 24 hr tablet  Commonly known as:  PLENDIL  Take 1 tablet (10 mg total) by mouth daily.     losartan-hydrochlorothiazide 100-25 MG per  tablet  Commonly known as:  HYZAAR  Take 1 tablet by mouth daily.     METFORMIN HCL PO  Take by mouth.     multivitamin tablet  Take 1 tablet by mouth 2 (two) times daily.     Nebivolol HCl 20 MG Tabs  Take 1 tablet (20 mg total) by mouth 2 (two) times daily.     VITAMIN D PO  Take by mouth.     zolpidem 10 MG tablet  Commonly known as:  AMBIEN  TAKE 1 TABLET BY MOUTH AT BEDTIME AS NEEDED FOR SLEEP           Objective:   Physical Exam BP 128/72 mmHg  Pulse 58  Temp(Src) 98.2 F (36.8 C) (Oral)  Wt 163 lb 4 oz (74.05 kg)  SpO2 100% General -- alert, well-developed, NAD.  HEENT-- Not pale.  R Ear-- normal L ear-- normal Throat symmetric, no redness or discharge. Face symmetric, sinuses not tender to palpation. Nose slt  congested. Lungs -- normal respiratory effort, no intercostal retractions, no accessory muscle use, and normal breath sounds.  Heart-- normal  rate, regular rhythm, no murmur.   Extremities-- no pretibial edema bilaterally  Neurologic--  alert & oriented X3. Speech normal, gait appropriate for age, strength symmetric and appropriate for age.  Psych-- Cognition and judgment appear intact. Cooperative with normal attention span and concentration. No anxious or depressed appearing.     Assessment & Plan:   Cough, Atypical bronchitis?. Plan: Z-Pak, Tessalon, Mucinex. See instructions

## 2014-10-17 NOTE — Progress Notes (Signed)
Pre visit review using our clinic review tool, if applicable. No additional management support is needed unless otherwise documented below in the visit note. 

## 2014-10-17 NOTE — Assessment & Plan Note (Signed)
To do a intership in a  hospital Tdap today PPD placed today , return in 2 days Needs a hepatitis B titer Also request a flu shot, I recommend to come back in 2 weeks once she feels well

## 2014-10-17 NOTE — Telephone Encounter (Signed)
I do not have access to [facility]bigail Campos registry, informed Pt we may be able to do titers on her blood if she knows which immunizations that she needs to be checked for. She stated she needed TB, Flu, TDAP and Hep B titers.

## 2014-10-17 NOTE — Telephone Encounter (Signed)
Pt states she called Friday, 10/12/2014, and asked for her immunization record.  She came by office today, 10/17/2014, and nothing was at the front desk for her to pick up.  I pulled up her immunization record in chart, and the only listed was her flu shot from 2014.  Can you please pull up the [place] registry and see if you can find her immunization record?

## 2014-10-17 NOTE — Patient Instructions (Addendum)
Get your blood work before you leave    Come back Friday to read your PPD  Rest, fluids , tylenol If  cough, take Mucinex DM twice a day as needed  If the cough continue take Tessalon Perles as needed  Take the antibiotic as prescribed  (zithromax )  Call if not gradually better over the next  10 days Call anytime if the symptoms are severe   Please come back to the office in 3-4 months  for a physical exam. Come back fasting

## 2014-10-18 LAB — HEPATITIS B SURFACE ANTIBODY,QUALITATIVE: Hep B S Ab: NEGATIVE

## 2014-10-19 ENCOUNTER — Ambulatory Visit (INDEPENDENT_AMBULATORY_CARE_PROVIDER_SITE_OTHER): Payer: Managed Care, Other (non HMO) | Admitting: Family

## 2014-10-19 ENCOUNTER — Encounter: Payer: Self-pay | Admitting: Family

## 2014-10-19 VITALS — BP 130/80 | HR 79 | Temp 98.4°F | Resp 16 | Wt 161.2 lb

## 2014-10-19 DIAGNOSIS — Z23 Encounter for immunization: Secondary | ICD-10-CM | POA: Insufficient documentation

## 2014-10-19 DIAGNOSIS — L03114 Cellulitis of left upper limb: Secondary | ICD-10-CM

## 2014-10-19 LAB — TB SKIN TEST
Induration: 0 mm
TB Skin Test: NEGATIVE

## 2014-10-19 MED ORDER — CEFUROXIME AXETIL 500 MG PO TABS: 500.0000 mg | ORAL_TABLET | Freq: Two times a day (BID) | ORAL | Status: DC

## 2014-10-19 NOTE — Patient Instructions (Signed)
Stop zpak. Start ceftin. Call if you develop recurrent fever, if you have increased pain, swelling, or if you develop redness surrounding the injection site.  Follow up in 1 week.

## 2014-10-19 NOTE — Assessment & Plan Note (Signed)
Hep B surface antibody negative. Restart Hep B series today. Pt is instructed to return in 1 month and 6 months.

## 2014-10-19 NOTE — Progress Notes (Signed)
Pre visit review using our clinic review tool, if applicable. No additional management support is needed unless otherwise documented below in the visit note. 

## 2014-10-19 NOTE — Assessment & Plan Note (Signed)
Will rx for cellulitis. Pt advised to call for follow up as outlined in AVS.  D/C zithromax and change to ceftin for improved skin coverage. Will also adequately cover her for bronchitis.

## 2014-10-19 NOTE — Progress Notes (Signed)
Subjective:    Patient ID: Abigail Campos, female    DOB: [birth year], 43 y.o.   MRN: [id number]  HPI  Abigail Campos is a 43 yr old female who presents today following tdap  shot on 10/17/14.  She presents with chief complaint of left arm swelling and pain.  She reports that on 11/5 she has some soreness at the site and applied ice and took motrin. Woke up the following day and pain had worsened. Reports that she now has pain and swelling that is present L upper arm and left axilla. Hurts to move the arm.  Reports fever 102 on Wed night. She is currently being treated with zithromax for bronchitis.   She would like to have PPD read today and also inquires re: results of Hep B testing.   Review of Systems    see HPI  Past Medical History  Diagnosis Date  . Headache(784.0)     developed migraines after 2nd pregnancy, had a negative CT of the head  . Hypertension     History   Social History  . Marital Status: Married    Spouse Name: N/A    Number of Children: 2  . Years of Education: N/A   Occupational History  . TEACHER    Social History Main Topics  . Smoking status: Never Smoker   . Smokeless tobacco: Never Used  . Alcohol Use: No  . Drug Use: No  . Sexual Activity: Not on file   Other Topics Concern  . Not on file   Social History Narrative    Past Surgical History  Procedure Laterality Date  . Inguinal hernia repair      04-02-2003  . Cesarean section      [id number]  . Abdominal hysterectomy  11-14-07    no oophorectomy per surgical report    Family History  Problem Relation Age of Onset  . Diabetes Mother   . Hypertension Mother   . Glaucoma Mother   . Heart disease      2 uncles died Apr 02, 2011 from CAD-CHF  . Stroke Maternal Grandfather   . Colon cancer Mother     mother age 59  . Diabetes Other     siblings  . Hypertension Other     siblings    Allergies  Allergen Reactions  . Hydrocodone Hives    Generalize itching w/o rash  . Aspirin  Swelling    Reports blisters w/ ASA but pt reports is ok w/ Motrin, Advil, naproxen  . Latex Hives  . Other     Bee stings  . Peanut Butter Flavor Swelling    Throat swells  . Peanut-Containing Drug Products     Current Outpatient Prescriptions on File Prior to Visit  Medication Sig Dispense Refill  . benzonatate (TESSALON) 200 MG capsule Take 1 capsule (200 mg total) by mouth 2 (two) times daily as needed for cough. 20 capsule 0  . Cholecalciferol (VITAMIN D PO) Take by mouth.    . felodipine (PLENDIL) 10 MG 24 hr tablet Take 1 tablet (10 mg total) by mouth daily. 30 tablet 5  . losartan-hydrochlorothiazide (HYZAAR) 100-25 MG per tablet Take 1 tablet by mouth daily. 30 tablet 6  . METFORMIN HCL PO Take by mouth.    . Multiple Vitamin (MULTIVITAMIN) tablet Take 1 tablet by mouth 2 (two) times daily.      . Nebivolol HCl 20 MG TABS Take 1 tablet (20 mg total) by mouth 2 (two) times daily.  60 tablet 1  . zolpidem (AMBIEN) 10 MG tablet TAKE 1 TABLET BY MOUTH AT BEDTIME AS NEEDED FOR SLEEP 30 tablet 5   No current facility-administered medications on file prior to visit.    BP 130/80 mmHg  Pulse 79  Temp(Src) 98.4 F (36.9 C) (Oral)  Resp 16  Wt 161 lb 3.2 oz (73.12 kg)  SpO2 97%    Objective:   Physical Exam  Constitutional: She is oriented to person, place, and time. She appears well-developed and well-nourished. No distress.  Cardiovascular: Normal rate and regular rhythm.   No murmur heard. Pulmonary/Chest: Effort normal and breath sounds normal. No respiratory distress. She has no wheezes. She has no rales. She exhibits no tenderness.  Musculoskeletal:  Left upper outer arm- swelling noted. Tender to touch, no significant induration. No abnormal warmth, no erythema.  + swelling and tenderness left axilla without palpable axillary LAD or palpable abscess.    Neurological: She is alert and oriented to person, place, and time.  Skin:  Left arm PPD- 0 mm induration           Assessment & Plan:

## 2014-11-06 ENCOUNTER — Encounter: Payer: Self-pay | Admitting: Medical

## 2014-11-06 ENCOUNTER — Telehealth: Payer: Self-pay

## 2014-11-06 ENCOUNTER — Ambulatory Visit (INDEPENDENT_AMBULATORY_CARE_PROVIDER_SITE_OTHER): Payer: Managed Care, Other (non HMO) | Admitting: Medical

## 2014-11-06 ENCOUNTER — Ambulatory Visit (HOSPITAL_BASED_OUTPATIENT_CLINIC_OR_DEPARTMENT_OTHER)
Admission: RE | Admit: 2014-11-06 | Discharge: 2014-11-06 | Disposition: A | Discharge status: Home / Self Care | Payer: Managed Care, Other (non HMO) | Source: Ambulatory Visit | Attending: Medical | Admitting: Medical

## 2014-11-06 VITALS — BP 170/90 | HR 69 | Temp 98.2°F | Ht 60.0 in | Wt 160.2 lb

## 2014-11-06 DIAGNOSIS — I10 Essential (primary) hypertension: Secondary | ICD-10-CM

## 2014-11-06 DIAGNOSIS — M419 Scoliosis, unspecified: Secondary | ICD-10-CM | POA: Diagnosis not present

## 2014-11-06 DIAGNOSIS — J209 Acute bronchitis, unspecified: Secondary | ICD-10-CM

## 2014-11-06 DIAGNOSIS — R05 Cough: Secondary | ICD-10-CM | POA: Insufficient documentation

## 2014-11-06 DIAGNOSIS — R0989 Other specified symptoms and signs involving the circulatory and respiratory systems: Secondary | ICD-10-CM | POA: Insufficient documentation

## 2014-11-06 DIAGNOSIS — R079 Chest pain, unspecified: Secondary | ICD-10-CM | POA: Insufficient documentation

## 2014-11-06 IMAGING — CR DG CHEST 2V
2 series · 2 of 2 positions shown · non-contrast
Comparison: Chest x-ray of [DATE]

CLINICAL DATA: Cough and congestion for 5 weeks, some chest pain

EXAM:
CHEST  2 VIEW

[w chest pa]
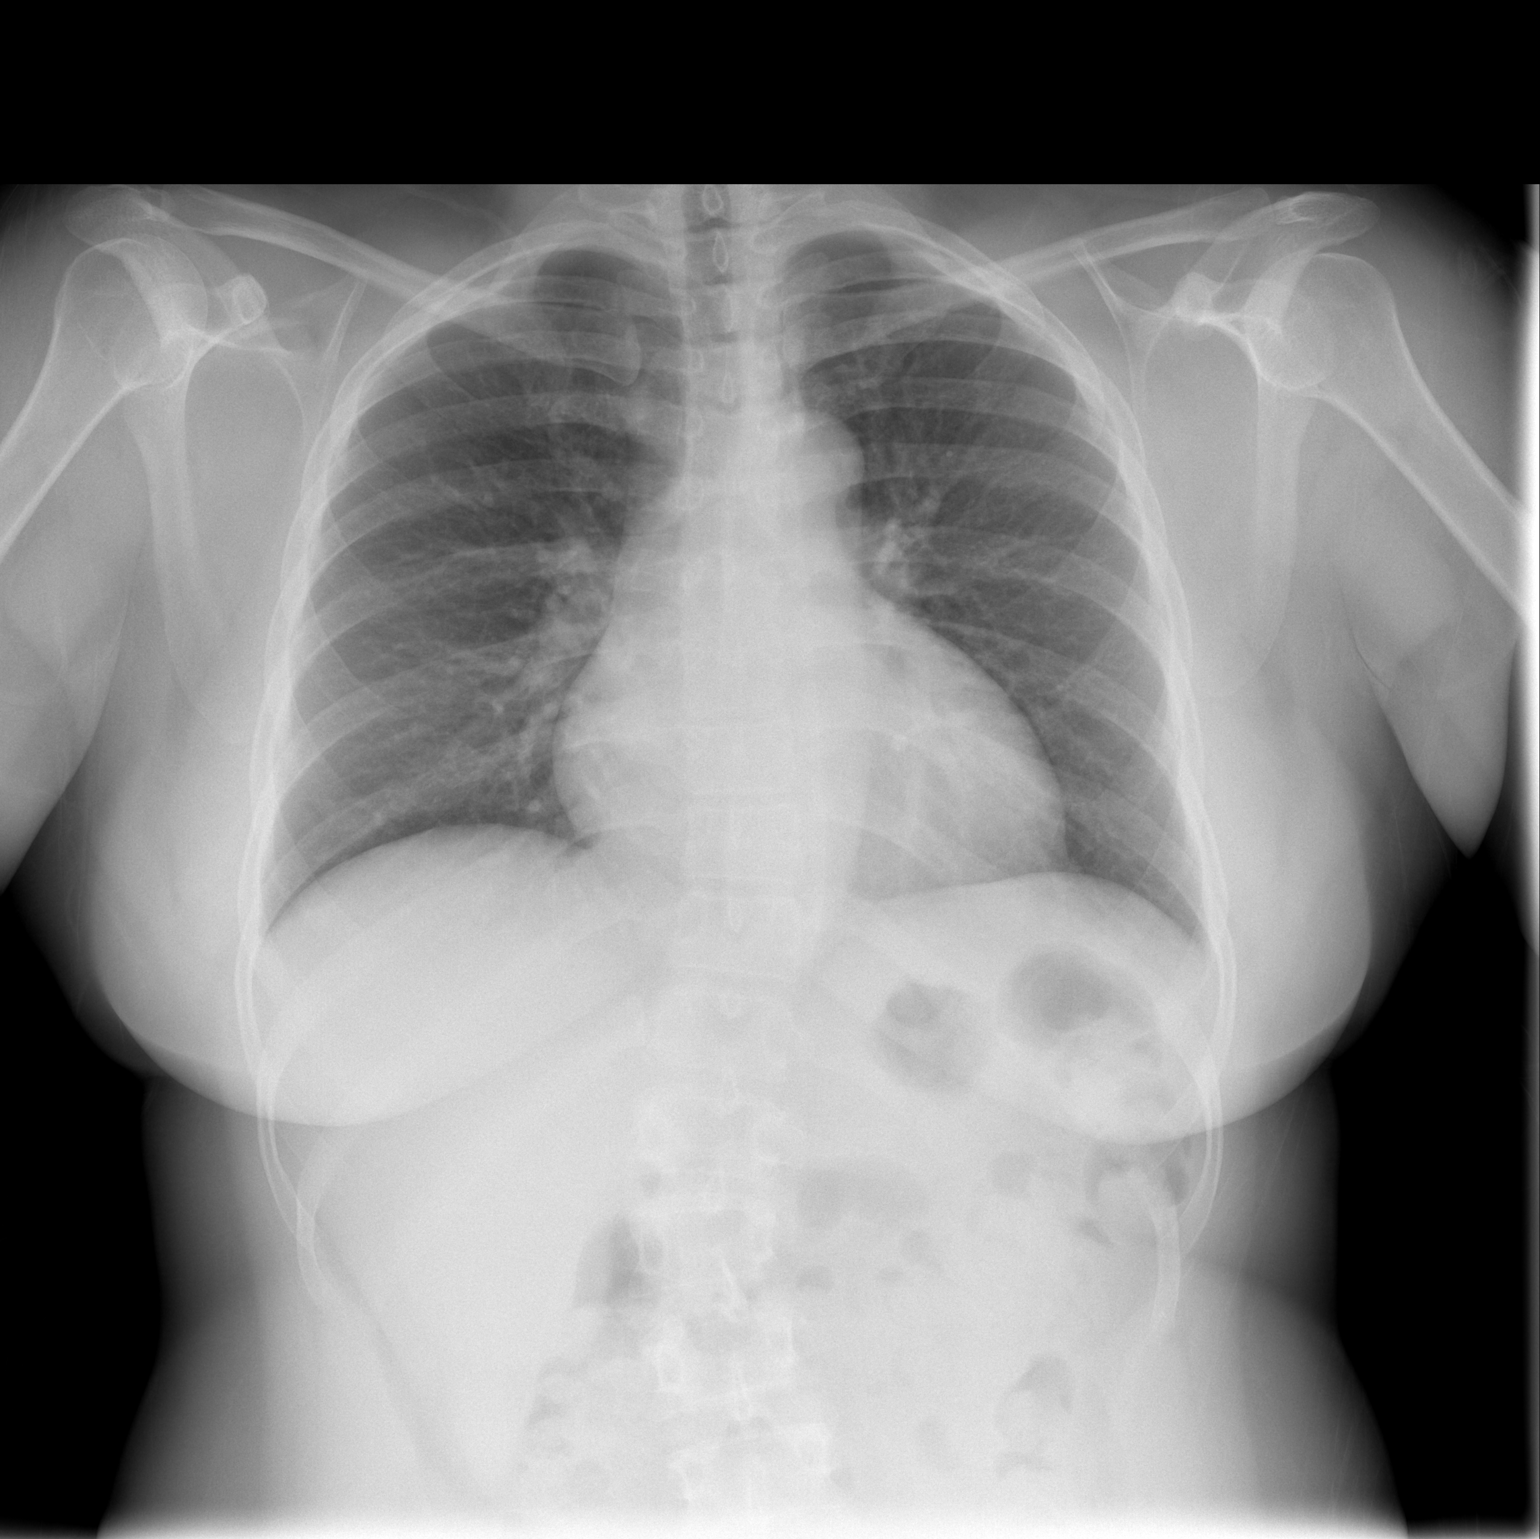

[w chest lat]
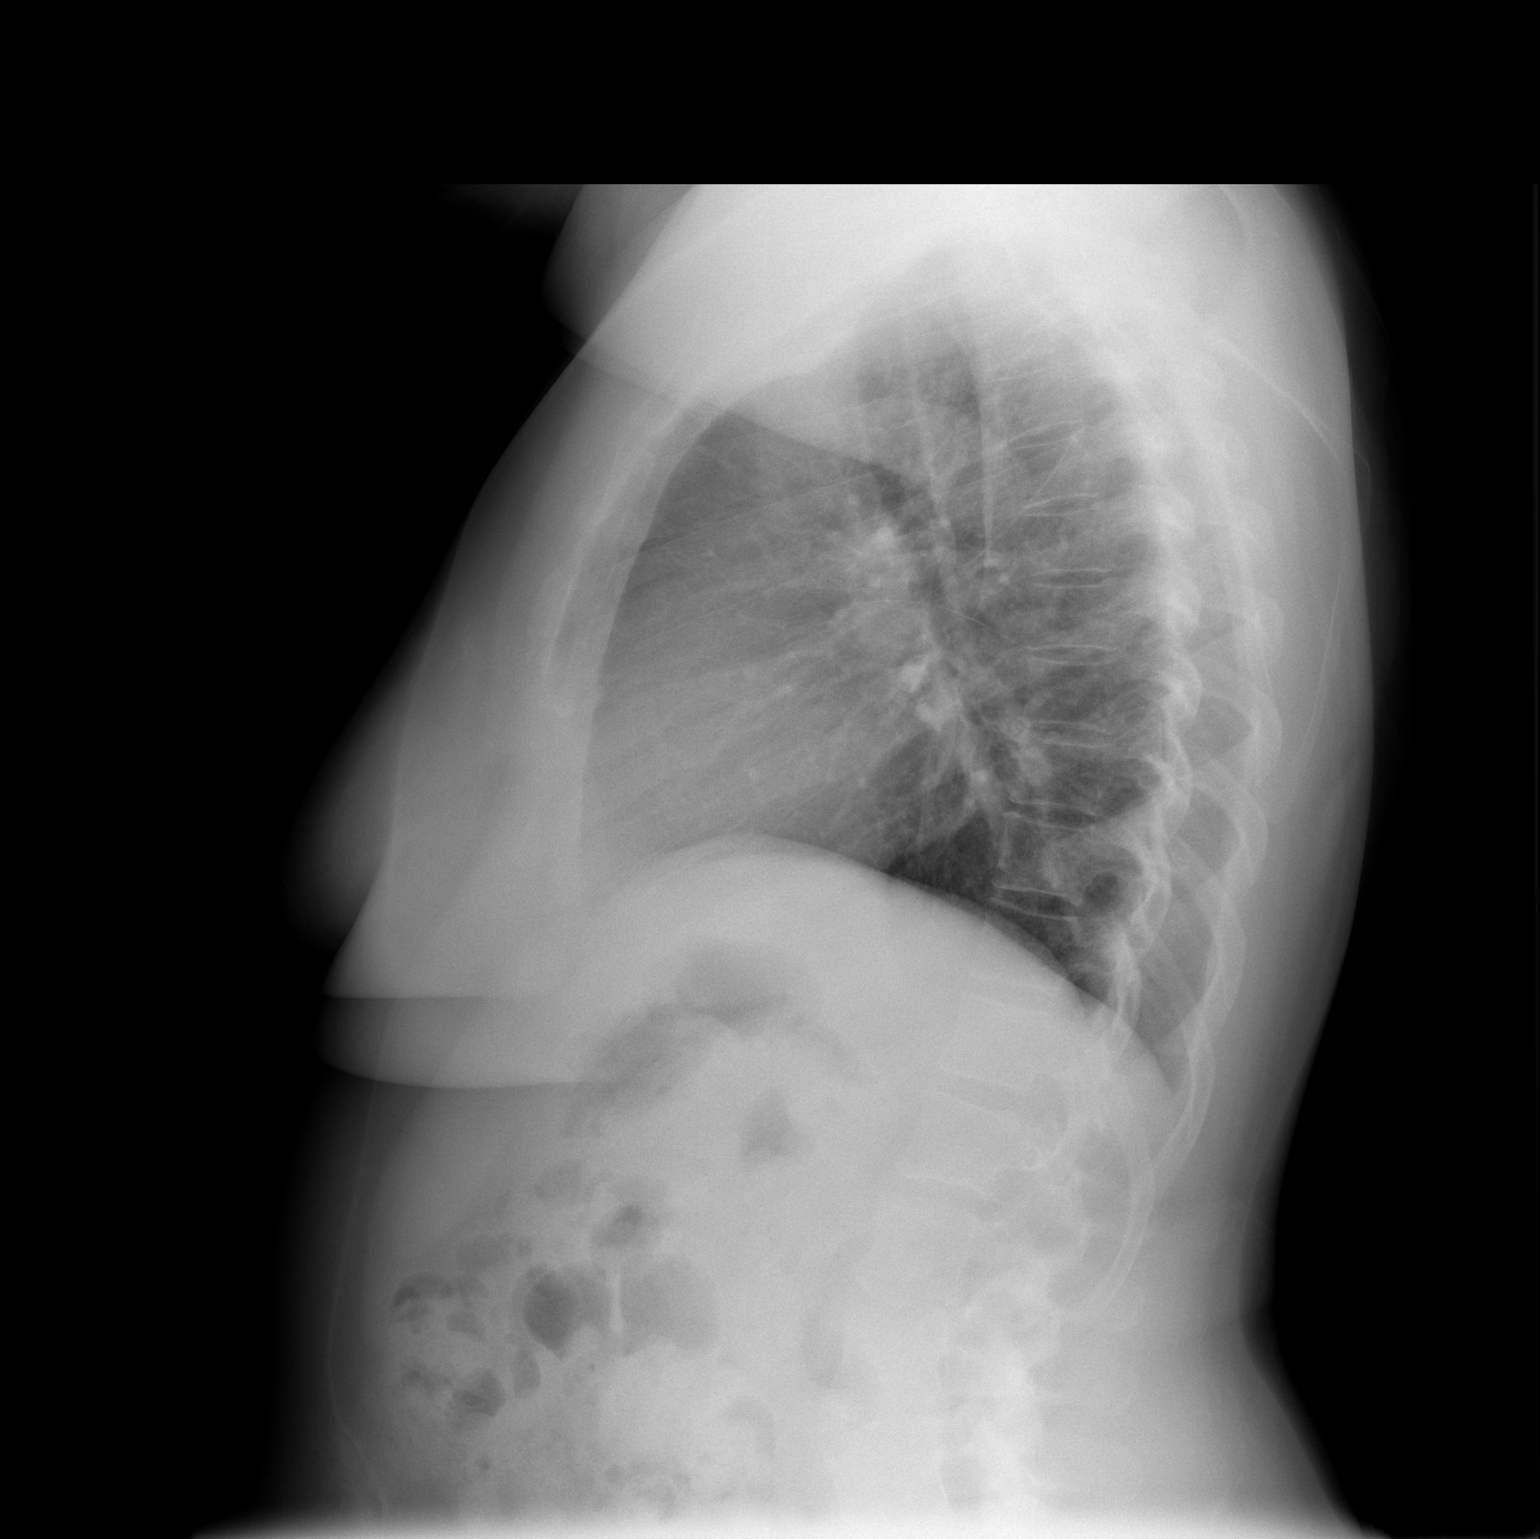

[2 of 2 positions shown; findings below may reference images not displayed]

FINDINGS: No active infiltrate or effusion is seen. Mediastinal and hilar
contours are unremarkable. The heart is within normal limits in
size. There is a mild thoracolumbar scoliosis present.
IMPRESSION: No active cardiopulmonary disease.  Mild thoracolumbar scoliosis.

## 2014-11-06 MED ORDER — FLUTICASONE PROPIONATE 50 MCG/ACT NA SUSP: 2.0000 | Freq: Every day | NASAL | Status: DC

## 2014-11-06 MED ORDER — LEVOFLOXACIN 500 MG PO TABS: 500.0000 mg | ORAL_TABLET | Freq: Every day | ORAL | Status: DC

## 2014-11-06 MED ORDER — CEFTRIAXONE SODIUM 1 G IJ SOLR
1.0000 g | Freq: Once | INTRAMUSCULAR | Status: AC
  Administered 2014-11-06: 1 g via INTRAMUSCULAR

## 2014-11-06 NOTE — Assessment & Plan Note (Signed)
For your bronchitis type symptoms for 4 weeks, we gave your rocephin im today, and I am giving levofloxin rx. I want you to get cxr today as well.  I am prescribing fluticasone for nasal congestion and take benzonatate for cough. Stop otc decongestant/cough med.

## 2014-11-06 NOTE — Patient Instructions (Addendum)
For your bronchitis type symptoms for 4 weeks, we gave your rocephin im today, and I am giving levofloxin rx. I want you to get cxr today as well.  I am prescribing fluticasone for nasal congestion and take benzonatate for cough. Stop otc decongestant/cough med.  For your bp. I think it is elevated to due to you taking your medication late and due to the fact you took otc decongestant. Stop the decongestant and check your bp daily. If your bp is not decreasing less than 140/90 then we need to see you by next Monday. If your bp remains high with any neurologic or cardiac signs or symptoms then ED or UC evalutation.   Follow up in 7-10 days or as needed

## 2014-11-06 NOTE — Telephone Encounter (Signed)
[name]  [phone]  Abigail Campos called to get the results of her Xray

## 2014-11-06 NOTE — Progress Notes (Signed)
   Subjective:    Patient ID: Abigail Campos, female    DOB: [birth year], 43 y.o.   MRN: [id number]  HPI   Pt is in for about 5 wks since onset of coughing and congestion. Pt was treated with azithromycin. Pt did not take the zpack. Then that was switched to ceftin to cover possible cellulitis.  So patient continue with cough, chest congestion and nasal congestion.Pt on Saturday felt fever. Pt is coughing up some mucous.  Pt bp is high just took med 20 minutes ago. But also taking otc decongestant cough medicine. Pt has no cardiac or neuro signs or symptoms.  Pt bp yesterday was 135/80.    Review of Systems  Constitutional: Positive for fever. Negative for chills and fatigue.  HENT: Positive for congestion. Negative for ear pain, mouth sores, nosebleeds, postnasal drip, rhinorrhea, sinus pressure, sore throat, tinnitus and trouble swallowing.   Respiratory: Negative for cough, chest tightness, shortness of breath and wheezing.        Mild chest congestion.  Cardiovascular: Negative for chest pain and palpitations.  Gastrointestinal: Negative for nausea, vomiting, abdominal pain and constipation.  Genitourinary: Negative for frequency, flank pain and dyspareunia.  Musculoskeletal: Negative for back pain.  Neurological: Negative for dizziness, tremors, seizures, syncope, facial asymmetry, speech difficulty, weakness, light-headedness, numbness and headaches.  Hematological: Negative for adenopathy.       Objective:   Physical Exam   General  Mental Status - Alert. General Appearance - Well groomed. Not in acute distress.  Skin Rashes- No Rashes.  HEENT Head- Normal. Ear Auditory Canal - Left- Normal. Right - Normal.Tympanic Membrane- Left- Normal. Right- Normal. Eye Sclera/Conjunctiva- Left- Normal. Right- Normal. Nose & Sinuses Nasal Mucosa- Left- Boggy +Congested. Right-  Boggy +Congested. Mouth & Throat Lips: Upper Lip- Normal: no dryness, cracking, pallor,  cyanosis, or vesicular eruption. Lower Lip-Normal: no dryness, cracking, pallor, cyanosis or vesicular eruption. Buccal Mucosa- Bilateral- No Aphthous ulcers. Oropharynx- No Discharge or Erythema. Tonsils: Characteristics- Bilateral- No Erythema or Congestion. Size/Enlargement- Bilateral- No enlargement. Discharge- bilateral-None.  Neck Neck- Supple. No Masses.   Chest and Lung Exam Auscultation: Breath Sounds:-even and unlabored but faint upper lobe rhonchi.  Cardiovascular Auscultation:Rythm- Regular, rate and rhythm.  Murmurs & Other Heart Sounds:Ausculatation of the heart reveal- No Murmurs.  Lymphatic Head & Neck General Head & Neck Lymphatics: Bilateral: Description- No Localized lymphadenopathy.         Assessment & Plan:

## 2014-11-06 NOTE — Progress Notes (Signed)
Pre visit review using our clinic review tool, if applicable. No additional management support is needed unless otherwise documented below in the visit note. 

## 2014-11-06 NOTE — Assessment & Plan Note (Signed)
   For your bp. I think it is elevated to due to you taking your medication late and due to the fact you took otc decongestant. Stop the decongestant and check your bp daily. If your bp is not decreasing less than 140/90 then we need to see you by next Monday. If your bp remains high with any neurologic or cardiac signs or symptoms then ED or UC evalutation

## 2014-11-07 NOTE — Telephone Encounter (Signed)
Left result message on patients answering machine.

## 2015-03-22 ENCOUNTER — Encounter: Payer: Self-pay | Admitting: Family

## 2015-03-22 ENCOUNTER — Ambulatory Visit (INDEPENDENT_AMBULATORY_CARE_PROVIDER_SITE_OTHER): Payer: Managed Care, Other (non HMO) | Admitting: Family

## 2015-03-22 VITALS — BP 150/110 | HR 82 | Temp 98.2°F | Resp 16 | Ht 60.0 in | Wt 164.8 lb

## 2015-03-22 DIAGNOSIS — G4700 Insomnia, unspecified: Secondary | ICD-10-CM | POA: Diagnosis not present

## 2015-03-22 DIAGNOSIS — I10 Essential (primary) hypertension: Secondary | ICD-10-CM

## 2015-03-22 MED ORDER — CLONIDINE HCL 0.1 MG PO TABS: 0.1000 mg | ORAL_TABLET | Freq: Two times a day (BID) | ORAL | Status: DC

## 2015-03-22 MED ORDER — NEBIVOLOL HCL 20 MG PO TABS: 20.0000 mg | ORAL_TABLET | Freq: Two times a day (BID) | ORAL | Status: DC

## 2015-03-22 MED ORDER — FELODIPINE ER 10 MG PO TB24: 10.0000 mg | ORAL_TABLET | Freq: Every day | ORAL | Status: DC

## 2015-03-22 MED ORDER — SUVOREXANT 10 MG PO TABS: 1.0000 | ORAL_TABLET | Freq: Every evening | ORAL | Status: DC | PRN

## 2015-03-22 MED ORDER — LOSARTAN POTASSIUM-HCTZ 100-25 MG PO TABS: 1.0000 | ORAL_TABLET | Freq: Every day | ORAL | Status: DC

## 2015-03-22 NOTE — Progress Notes (Signed)
Pre visit review using our clinic review tool, if applicable. No additional management support is needed unless otherwise documented below in the visit note. 

## 2015-03-22 NOTE — Patient Instructions (Signed)
Stop ambien, start belsomra. Add clonidine twice daily for blood pressure. You will be contacted about your renal artery duplex. Please complete urine test and return at your earliest convenience. Please follow up in 2 weeks.

## 2015-03-22 NOTE — Progress Notes (Signed)
Subjective:    Patient ID: Abigail Campos, female    DOB: 14-Aug-1971, 44 y.o.   MRN: [id number]  HPI  Patient is currently maintained on the following medications for blood pressure: plendil, hyzaar, nebivolol Patient reports good compliance with blood pressure medications. Patient denies chest pain, shortness of breath or swelling. Last 3 blood pressure readings in our office are as follows: BP Readings from Last 3 Encounters:  03/22/15 150/110  11/06/14 170/90  10/19/14 130/80   Insomnia- could not tolerate ambien, caused amnesia. No caffeine after 2pm. Avoids HS screens.     Review of Systems    see HPI  Past Medical History  Diagnosis Date  . Headache(784.0)     developed migraines after 2nd pregnancy, had a negative CT of the head  . Hypertension     History   Social History  . Marital Status: Married    Spouse Name: N/A  . Number of Children: 2  . Years of Education: N/A   Occupational History  . TEACHER    Social History Main Topics  . Smoking status: Never Smoker   . Smokeless tobacco: Never Used  . Alcohol Use: No  . Drug Use: No  . Sexual Activity: Not on file   Other Topics Concern  . Not on file   Social History Narrative    Past Surgical History  Procedure Laterality Date  . Inguinal hernia repair      03/23/03  . Cesarean section      [id number]  . Abdominal hysterectomy  11-14-07    no oophorectomy per surgical report    Family History  Problem Relation Age of Onset  . Diabetes Mother   . Hypertension Mother   . Glaucoma Mother   . Heart disease      2 uncles died Mar 23, 2011 from CAD-CHF  . Stroke Maternal Grandfather   . Colon cancer Mother     mother age 71  . Diabetes Other     siblings  . Hypertension Other     siblings    Allergies  Allergen Reactions  . Hydrocodone Hives    Generalize itching w/o rash  . Ambien [Zolpidem Tartrate] Other (See Comments)    forgetfulness  . Aspirin Swelling    Reports blisters w/ ASA  but pt reports is ok w/ Motrin, Advil, naproxen  . Latex Hives  . Other     Bee stings  . Peanut Butter Flavor Swelling    Throat swells  . Peanut-Containing Drug Products     Current Outpatient Prescriptions on File Prior to Visit  Medication Sig Dispense Refill  . METFORMIN HCL PO Take by mouth.    . Multiple Vitamin (MULTIVITAMIN) tablet Take 1 tablet by mouth 2 (two) times daily.      . Cholecalciferol (VITAMIN D PO) Take by mouth.     No current facility-administered medications on file prior to visit.    BP 150/110 mmHg  Pulse 82  Temp(Src) 98.2 F (36.8 C) (Oral)  Resp 16  Ht 5' (1.524 m)  Wt 164 lb 12.8 oz (74.753 kg)  BMI 32.19 kg/m2  SpO2 96%    Objective:   Physical Exam  Constitutional: She is oriented to person, place, and time. She appears well-developed and well-nourished.  HENT:  Head: Normocephalic and atraumatic.  Cardiovascular: Normal rate, regular rhythm and normal heart sounds.   No murmur heard. Pulmonary/Chest: Effort normal and breath sounds normal. No respiratory distress. She has no wheezes.  Musculoskeletal:  She exhibits no edema.  Neurological: She is alert and oriented to person, place, and time.  Skin: Skin is warm and dry.  Psychiatric: She has a normal mood and affect. Her behavior is normal. Judgment and thought content normal.          Assessment & Plan:

## 2015-03-24 NOTE — Assessment & Plan Note (Signed)
Uncontrolled.  Add clonidine bid, follow up in 2 weeks for bp recheck.

## 2015-03-24 NOTE — Assessment & Plan Note (Signed)
Uncontrolled, trial of belsomra.

## 2015-03-27 ENCOUNTER — Encounter (HOSPITAL_COMMUNITY): Payer: Managed Care, Other (non HMO)

## 2015-03-28 ENCOUNTER — Encounter (HOSPITAL_COMMUNITY): Payer: Managed Care, Other (non HMO)

## 2015-03-29 ENCOUNTER — Encounter: Payer: Self-pay | Admitting: Family

## 2015-03-29 LAB — CATECHOLAMINES, FRACTIONATED, URINE, 24 HOUR
Calculated Total (E+NE): 20 mcg/24 h — ABNORMAL LOW (ref 26–121)
Creatinine, Urine mg/day-CATEUR: 1.03 g/(24.h) (ref 0.63–2.50)
Dopamine, 24 hr Urine: 214 mcg/24 h (ref 52–480)
Epinephrine, 24 hr Urine: 2 mcg/24 h (ref 2–24)
Norepinephrine, 24 hr Ur: 18 mcg/24 h (ref 15–100)
Total Volume - CF 24Hr U: 800 mL

## 2015-03-29 LAB — METANEPHRINES, URINE, 24 HOUR
Metaneph Total, Ur: 152 mcg/24 h — ABNORMAL LOW (ref 182–739)
Metanephrines, Ur: 43 mcg/24 h — ABNORMAL LOW (ref 58–203)
Normetanephrine, 24H Ur: 109 mcg/24 h (ref 88–649)

## 2015-04-02 ENCOUNTER — Ambulatory Visit (HOSPITAL_COMMUNITY): Payer: Managed Care, Other (non HMO) | Attending: Cardiology | Admitting: Cardiology

## 2015-04-02 DIAGNOSIS — I10 Essential (primary) hypertension: Secondary | ICD-10-CM | POA: Diagnosis not present

## 2015-04-02 NOTE — Progress Notes (Signed)
Renal artery duplex performed  

## 2015-04-09 ENCOUNTER — Encounter: Payer: Self-pay | Admitting: *Deleted

## 2015-07-16 ENCOUNTER — Telehealth: Payer: Self-pay | Admitting: Internal Medicine

## 2015-07-16 ENCOUNTER — Ambulatory Visit: Payer: Managed Care, Other (non HMO) | Admitting: Internal Medicine

## 2015-07-16 NOTE — Telephone Encounter (Signed)
ok 
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
## 2015-07-17 ENCOUNTER — Ambulatory Visit (INDEPENDENT_AMBULATORY_CARE_PROVIDER_SITE_OTHER): Payer: Managed Care, Other (non HMO) | Admitting: Internal Medicine

## 2015-07-17 ENCOUNTER — Encounter: Payer: Self-pay | Admitting: Internal Medicine

## 2015-07-17 ENCOUNTER — Ambulatory Visit: Payer: Managed Care, Other (non HMO) | Admitting: Internal Medicine

## 2015-07-17 VITALS — BP 142/98 | HR 60 | Temp 98.1°F | Ht 60.0 in | Wt 165.2 lb

## 2015-07-17 DIAGNOSIS — G4700 Insomnia, unspecified: Secondary | ICD-10-CM | POA: Diagnosis not present

## 2015-07-17 DIAGNOSIS — I10 Essential (primary) hypertension: Secondary | ICD-10-CM

## 2015-07-17 MED ORDER — PREDNISONE 10 MG PO TABS
ORAL_TABLET | ORAL | Status: DC
Start: 2015-07-17 — End: 2015-09-25

## 2015-07-17 MED ORDER — FELODIPINE ER 10 MG PO TB24: 10.0000 mg | ORAL_TABLET | Freq: Every day | ORAL | Status: DC

## 2015-07-17 MED ORDER — TEMAZEPAM 7.5 MG PO CAPS: 7.5000 mg | ORAL_CAPSULE | Freq: Every evening | ORAL | Status: DC | PRN

## 2015-07-17 MED ORDER — CLONIDINE HCL 0.1 MG PO TABS: 0.1000 mg | ORAL_TABLET | Freq: Two times a day (BID) | ORAL | Status: DC

## 2015-07-17 MED ORDER — NEBIVOLOL HCL 20 MG PO TABS: 20.0000 mg | ORAL_TABLET | Freq: Two times a day (BID) | ORAL | Status: DC

## 2015-07-17 MED ORDER — LOSARTAN POTASSIUM-HCTZ 100-25 MG PO TABS: 1.0000 | ORAL_TABLET | Freq: Every day | ORAL | Status: DC

## 2015-07-17 NOTE — Patient Instructions (Signed)
For the ringing in ears: Prednisone for a few days OTC Flonase 2 sprays in each side of the nose every day Call if not improving  Try one or 2 restoril  at bedtime.   Check the  blood pressure 2 or 3 times a month  Be sure your blood pressure is between 110/65 and  145/85.  if it is consistently higher or lower, let me know

## 2015-07-17 NOTE — Progress Notes (Signed)
Subjective:    Patient ID: Abigail Campos, female    DOB: [birth year], 44 y.o.   MRN: [id number]  DOS:  07/17/2015 Type of visit - description : To discuss several issues Interval history:  Hypertension: BP elevated today but ran out of medication approximately 3 days ago, when she takes her medications ambulatory BPs are normal 4 weeks ago went to urgent care, diagnosed with ear infection and prescribed antibiotics. Ear pain gone however she still has some "echo" "ringing" in the right ear.  Insomnia: Long history of insomnia, has difficulty both falling and staying asleep. She practices excellent sleep habits. Recently prescribed belsomera without much help.   Review of Systems denies sinus pain and congestion. No ear discharge Admits to feeling slightly sleepy in the mornings but at nighttime she is "wired". Occasional snoring No anxiety depression  Past Medical History  Diagnosis Date  . Headache(784.0)     developed migraines after 2nd pregnancy, had a negative CT of the head  . Hypertension     Past Surgical History  Procedure Laterality Date  . Inguinal hernia repair      2004  . Cesarean section      [id number]  . Abdominal hysterectomy  11-14-07    no oophorectomy per surgical report    History   Social History  . Marital Status: Married    Spouse Name: N/A  . Number of Children: 2  . Years of Education: N/A   Occupational History  . TEACHER    Social History Main Topics  . Smoking status: Never Smoker   . Smokeless tobacco: Never Used  . Alcohol Use: No  . Drug Use: No  . Sexual Activity: Not on file   Other Topics Concern  . Not on file   Social History Narrative        Medication List       This list is accurate as of: 07/17/15 11:59 PM.  Always use your most recent med list.               cloNIDine 0.1 MG tablet  Commonly known as:  CATAPRES  Take 1 tablet (0.1 mg total) by mouth 2 (two) times daily.     felodipine 10 MG 24 hr  tablet  Commonly known as:  PLENDIL  Take 1 tablet (10 mg total) by mouth daily.     losartan-hydrochlorothiazide 100-25 MG per tablet  Commonly known as:  HYZAAR  Take 1 tablet by mouth daily.     METFORMIN HCL PO  Take by mouth.     multivitamin tablet  Take 1 tablet by mouth 2 (two) times daily.     Nebivolol HCl 20 MG Tabs  Take 1 tablet (20 mg total) by mouth 2 (two) times daily.     predniSONE 10 MG tablet  Commonly known as:  DELTASONE  4 tablets x 2 days, 3 tabs x 2 days, 2 tabs x 2 days, 1 tab x 2 days     temazepam 7.5 MG capsule  Commonly known as:  RESTORIL  Take 1-2 capsules (7.5-15 mg total) by mouth at bedtime as needed for sleep.     VITAMIN D PO  Take by mouth.           Objective:   Physical Exam BP 142/98 mmHg  Pulse 60  Temp(Src) 98.1 F (36.7 C) (Oral)  Ht 5' (1.524 m)  Wt 165 lb 4 oz (74.957 kg)  BMI 32.27 kg/m2  SpO2 98%  General:   Well developed, well nourished . NAD.  HEENT:  Normocephalic . Face symmetric, atraumatic. Left TMs normal, right TM: No red, no discharge, canal normal, TM slightly flat.  Nose is slightly congested Lungs:  CTA B Normal respiratory effort, no intercostal retractions, no accessory muscle use. Heart: RRR,  no murmur.  No pretibial edema bilaterally  Skin: Not pale. Not jaundice Neurologic:  alert & oriented X3.  Speech normal, gait appropriate for age and unassisted Psych--  Cognition and judgment appear intact.  Cooperative with normal attention span and concentration.  Behavior appropriate. No anxious or depressed appearing.      Assessment & Plan:    tinnitus, Tinnitus after a ear infection, serous otitis? Plan: Prednisone for a few days, Flonase. If not better will call for a ENT referral

## 2015-07-17 NOTE — Progress Notes (Signed)
Pre visit review using our clinic review tool, if applicable. No additional management support is needed unless otherwise documented below in the visit note. 

## 2015-07-17 NOTE — Assessment & Plan Note (Addendum)
Persistent insomnia despite good sleep habits, no anxiety- depression. In the past did not like how Ambien make her feel,belsomra is not helping. Epworth Sleepiness Scale negative. Plan: Trial with restoril

## 2015-07-17 NOTE — Assessment & Plan Note (Signed)
Back in April, BP was elevated, clonidine was added into her regimen with good results. No apparent side effects. BP today slightly elevated but she is out of medications for 3 days. Last BMP satisfactory Plan: Refill medicines, monitor ambulatory BPs.

## 2015-08-01 ENCOUNTER — Encounter (HOSPITAL_BASED_OUTPATIENT_CLINIC_OR_DEPARTMENT_OTHER): Payer: Self-pay | Admitting: *Deleted

## 2015-08-01 ENCOUNTER — Emergency Department (HOSPITAL_BASED_OUTPATIENT_CLINIC_OR_DEPARTMENT_OTHER)
Admission: EM | Admit: 2015-08-01 | Discharge: 2015-08-02 | Disposition: A | Discharge status: Home / Self Care | Payer: Managed Care, Other (non HMO) | Attending: Emergency Medicine | Admitting: Emergency Medicine

## 2015-08-01 ENCOUNTER — Emergency Department (HOSPITAL_BASED_OUTPATIENT_CLINIC_OR_DEPARTMENT_OTHER): Payer: Managed Care, Other (non HMO)

## 2015-08-01 DIAGNOSIS — Z79899 Other long term (current) drug therapy: Secondary | ICD-10-CM | POA: Insufficient documentation

## 2015-08-01 DIAGNOSIS — R0602 Shortness of breath: Secondary | ICD-10-CM | POA: Diagnosis not present

## 2015-08-01 DIAGNOSIS — I10 Essential (primary) hypertension: Secondary | ICD-10-CM | POA: Insufficient documentation

## 2015-08-01 DIAGNOSIS — R079 Chest pain, unspecified: Secondary | ICD-10-CM | POA: Diagnosis not present

## 2015-08-01 DIAGNOSIS — Z91040 Latex allergy status: Secondary | ICD-10-CM | POA: Diagnosis not present

## 2015-08-01 LAB — CBC
HCT: 39.2 % (ref 36.0–46.0)
Hemoglobin: 12.8 g/dL (ref 12.0–15.0)
MCH: 29.6 pg (ref 26.0–34.0)
MCHC: 32.7 g/dL (ref 30.0–36.0)
MCV: 90.7 fL (ref 78.0–100.0)
Platelets: 215 10*3/uL (ref 150–400)
RBC: 4.32 MIL/uL (ref 3.87–5.11)
RDW: 12.7 % (ref 11.5–15.5)
WBC: 6.2 10*3/uL (ref 4.0–10.5)

## 2015-08-01 LAB — TROPONIN I: Troponin I: <0.03 ng/mL (ref <0.031)

## 2015-08-01 LAB — BASIC METABOLIC PANEL
Anion gap: 7 (ref 5–15)
BUN: 8 mg/dL (ref 6–20)
CO2: 26 mmol/L (ref 22–32)
Calcium: 9.1 mg/dL (ref 8.9–10.3)
Chloride: 103 mmol/L (ref 101–111)
Creatinine, Ser: 0.76 mg/dL (ref 0.44–1.00)
GFR calc Af Amer: >60 mL/min (ref >60)
GFR calc non Af Amer: >60 mL/min (ref >60)
Glucose, Bld: 104 mg/dL — ABNORMAL HIGH (ref 65–99)
Potassium: 3.6 mmol/L (ref 3.5–5.1)
Sodium: 136 mmol/L (ref 135–145)

## 2015-08-01 LAB — BRAIN NATRIURETIC PEPTIDE: B Natriuretic Peptide: 6.1 pg/mL (ref 0.0–100.0)

## 2015-08-01 IMAGING — CR DG CHEST 2V
2 series · 2 of 2 positions shown · non-contrast
Comparison: [DATE]

CLINICAL DATA: Shortness of breath and chest pain for 5 days

EXAM:
CHEST  2 VIEW

[w chest pa]
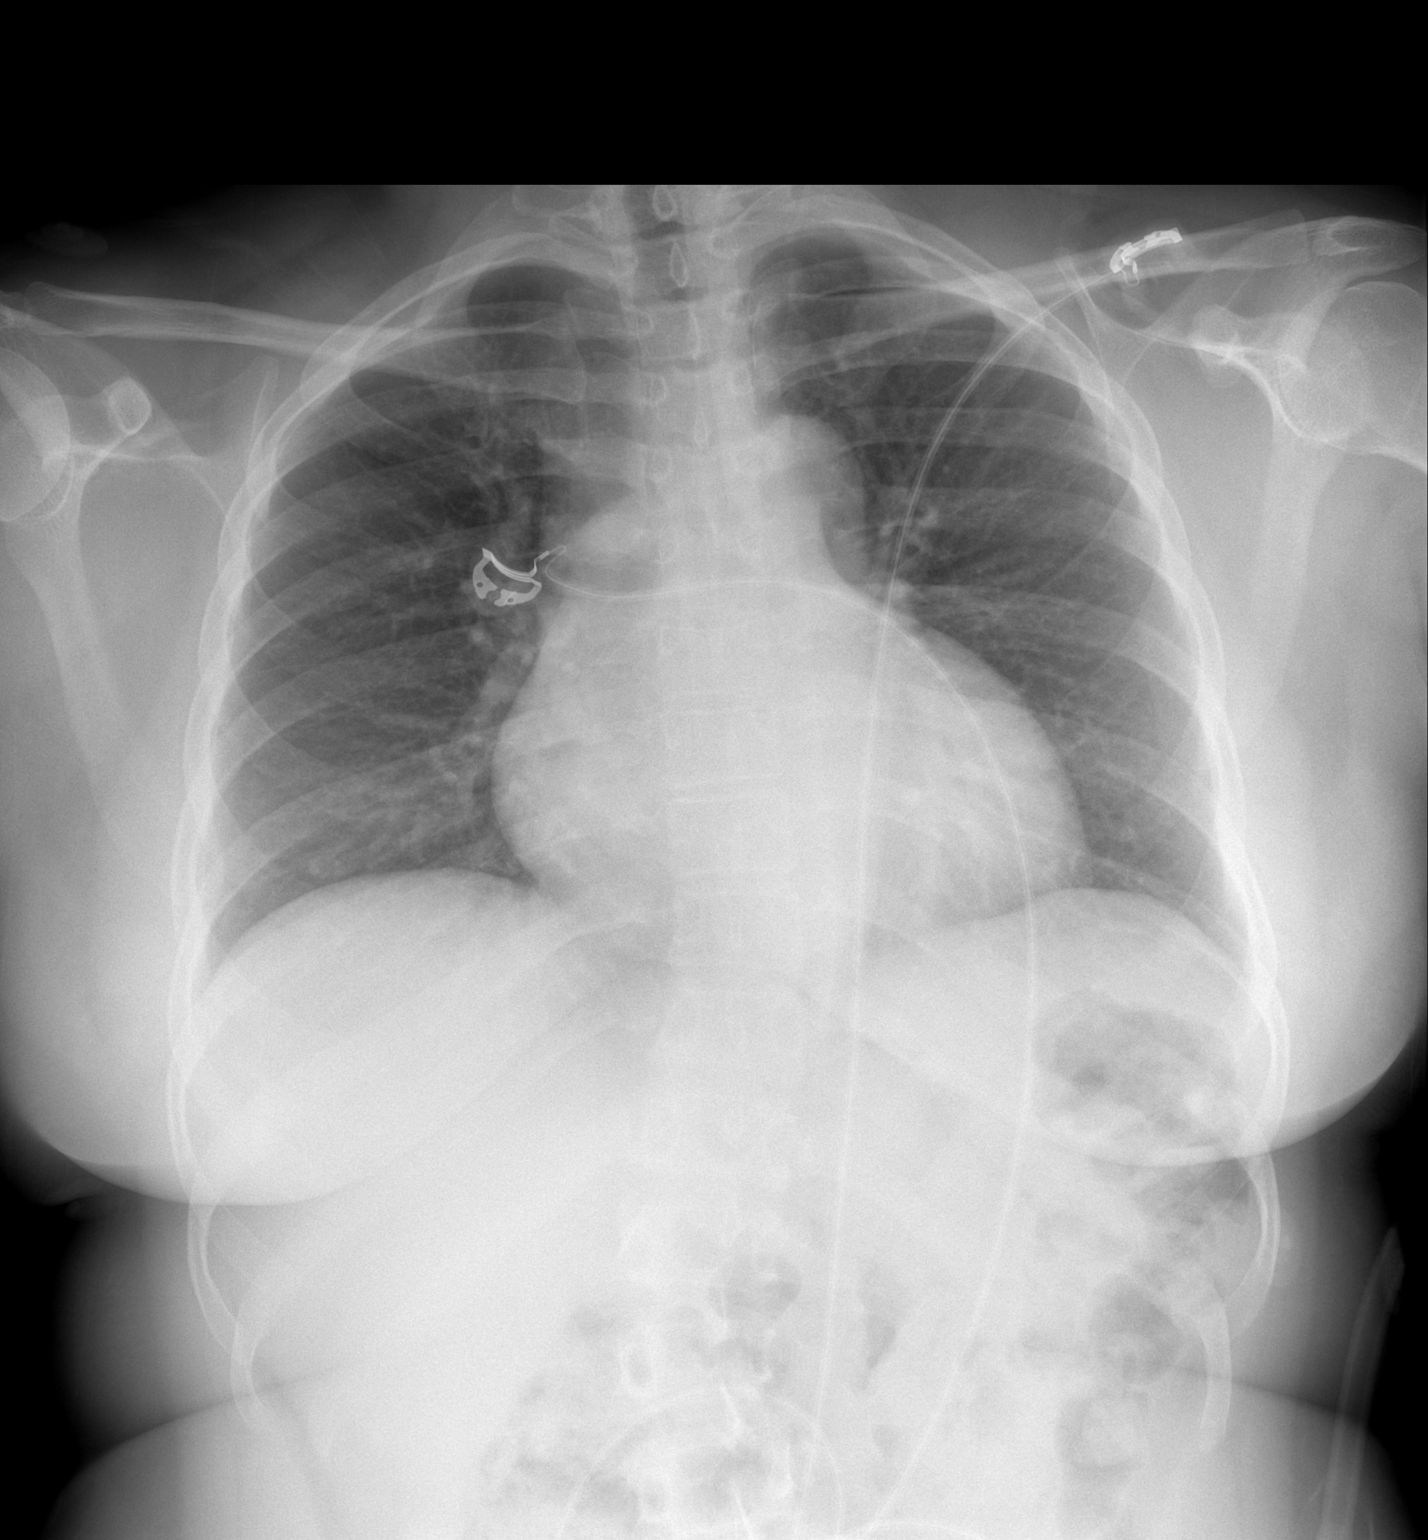

[w chest lat]
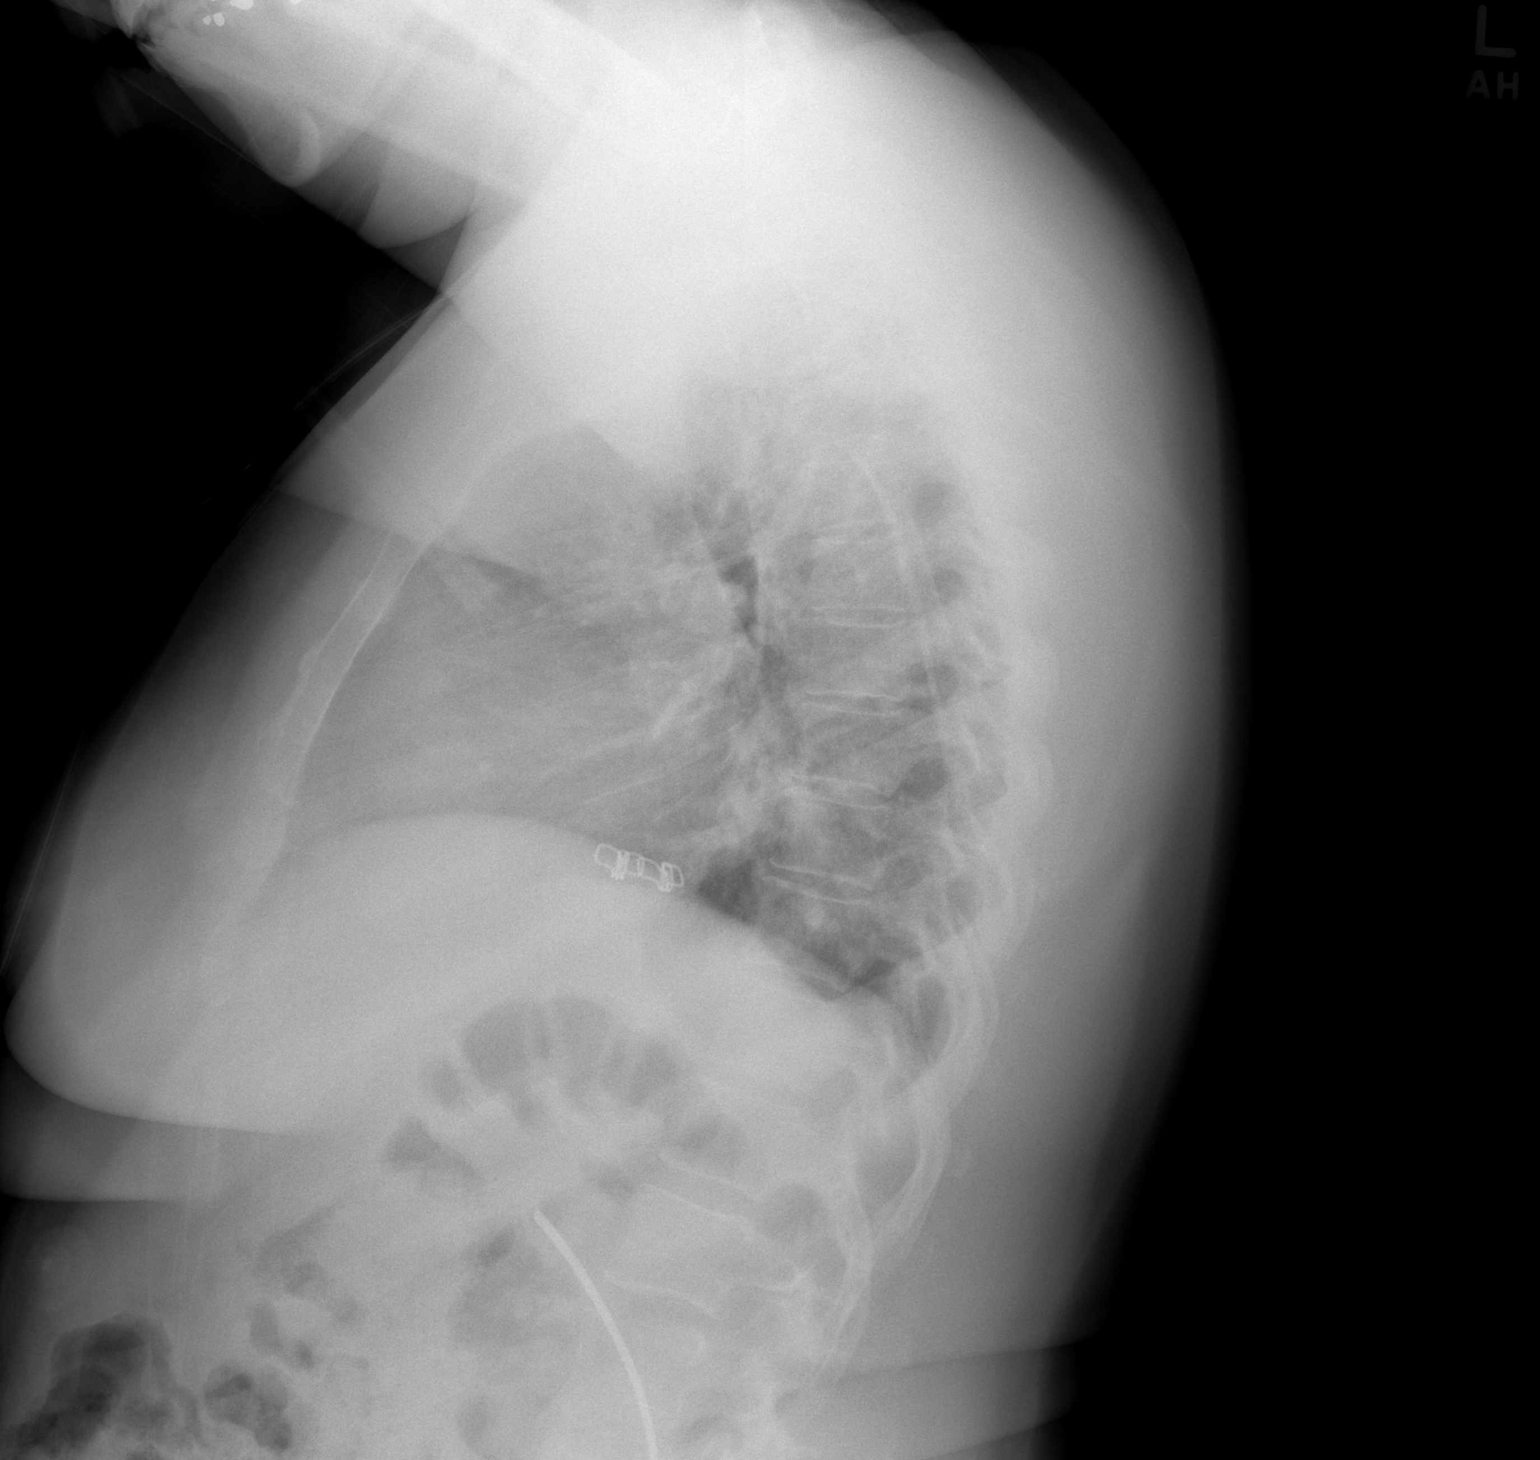

[2 of 2 positions shown; findings below may reference images not displayed]

FINDINGS: There is no edema or consolidation. Heart size and pulmonary
vascularity are within normal limits. No adenopathy. No
pneumothorax. There is stable upper lumbar dextroscoliosis with
rotatory component.
IMPRESSION: No edema or consolidation.

## 2015-08-01 MED ORDER — METOPROLOL TARTRATE 50 MG PO TABS
25.0000 mg | ORAL_TABLET | Freq: Once | ORAL | Status: AC
  Administered 2015-08-01: 25 mg via ORAL
  Filled 2015-08-01: qty 1

## 2015-08-01 MED ORDER — IPRATROPIUM-ALBUTEROL 0.5-2.5 (3) MG/3ML IN SOLN
3.0000 mL | RESPIRATORY_TRACT | Status: DC
  Administered 2015-08-01: 3 mL via RESPIRATORY_TRACT
  Filled 2015-08-01: qty 3

## 2015-08-01 MED ORDER — NITROGLYCERIN 0.4 MG SL SUBL
0.4000 mg | SUBLINGUAL_TABLET | SUBLINGUAL | Status: DC | PRN
  Administered 2015-08-01 (×2): 0.4 mg via SUBLINGUAL
  Filled 2015-08-01: qty 1

## 2015-08-01 MED ORDER — CLONIDINE HCL 0.1 MG PO TABS
0.1000 mg | ORAL_TABLET | Freq: Once | ORAL | Status: AC
  Administered 2015-08-01: 0.1 mg via ORAL
  Filled 2015-08-01: qty 1

## 2015-08-01 NOTE — ED Notes (Signed)
Pt here with squeezing CP that began while in class this pm and is associated with sob.  HX of HTN

## 2015-08-01 NOTE — ED Provider Notes (Signed)
[name]: [id number]     Arrival date & time 08/01/15  2037-04-05 History  This chart was scribed for Evelina Bucy, MD by Irene Pap, ED Scribe. This patient was seen in room MH09/MH09 and patient care was started at 9:08 PM.     Chief Complaint  Patient presents with  . Chest Pain   Patient is a 44 y.o. female presenting with chest pain. The history is provided by the patient. No language interpreter was used.  Chest Pain Pain location:  Substernal area Pain quality: tightness   Pain quality comment:  Squeezing Pain radiates to:  Does not radiate Pain radiates to the back: no   Pain severity:  Mild Onset quality:  Gradual Duration:  5 days Timing:  Intermittent Progression:  Worsening Chronicity:  New Context comment:  Began while working out in the heat 5 days ago. Acutely worsened tonight while in class Relieved by:  Nothing Worsened by:  Nothing tried Associated symptoms: shortness of breath   Associated symptoms: no abdominal pain and no fever    HPI Comments: Abigail Campos is a 44 y.o. female with hx of HTN who presents to the Emergency Department complaining of worsening, squeezing chest pain onset 3 hours ago. Pt reports associated SOB and non-productive cough when she could not catch her breath. Reports that she first noticed chest pain that resolved on Saturday. States that the pain has been waxing and waning tightness that worsened today. States pain worsens with bending at the waist, denies alleviating factors. She states that she has been having an ear infection on and off since July and continues to have tinnitus in the right ear. Reports taking 4 different medications for her HTN, has not taken two of them today. Pt denies hx of smoking, drinking, GERD, asthma, lung problems, COPD, seasonal allergies, abdominal pain or fever. 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 Reports allergies to aspirin, hydrocodone. PCP is Dr. Kathlene November.  Past  Medical History  Diagnosis Date  . Headache(784.0)     developed migraines after 2nd pregnancy, had a negative CT of the head  . Hypertension    Past Surgical History  Procedure Laterality Date  . Inguinal hernia repair      2003-04-06  . Cesarean section      [id number]  . Abdominal hysterectomy  11-14-07    no oophorectomy per surgical report   Family History  Problem Relation Age of Onset  . Diabetes Mother   . Hypertension Mother   . Glaucoma Mother   . Heart disease      2 uncles died 2011-04-06 from CAD-CHF  . Stroke Maternal Grandfather   . Colon cancer Mother     mother age 79  . Diabetes Other     siblings  . Hypertension Other     siblings   Social History  Substance Use Topics  . Smoking status: Never Smoker   . Smokeless tobacco: Never Used  . Alcohol Use: No   OB History    Gravida Para Term Preterm AB TAB SAB Ectopic Multiple Living   2 2             Review of Systems  Constitutional: Negative for fever.  Respiratory: Positive for shortness of breath.   Cardiovascular: Positive for chest pain.  Gastrointestinal: Negative for abdominal pain.  Allergic/Immunologic: Negative for environmental allergies.  All other systems reviewed and are negative.  Allergies  Hydrocodone; Ambien; Aspirin; Latex; Other;  Peanut butter flavor; and Peanut-containing drug products  Home Medications   Prior to Admission medications   Medication Sig Start Date End Date Taking? Authorizing Provider  Cholecalciferol (VITAMIN D PO) Take by mouth.    Historical Provider, MD  cloNIDine (CATAPRES) 0.1 MG tablet Take 1 tablet (0.1 mg total) by mouth 2 (two) times daily. 07/17/15   Colon Branch, MD  felodipine (PLENDIL) 10 MG 24 hr tablet Take 1 tablet (10 mg total) by mouth daily. 07/17/15   Colon Branch, MD  losartan-hydrochlorothiazide (HYZAAR) 100-25 MG per tablet Take 1 tablet by mouth daily. 07/17/15   Colon Branch, MD  METFORMIN HCL PO Take by mouth.    Historical Provider, MD  Multiple Vitamin  (MULTIVITAMIN) tablet Take 1 tablet by mouth 2 (two) times daily.      Historical Provider, MD  Nebivolol HCl 20 MG TABS Take 1 tablet (20 mg total) by mouth 2 (two) times daily. 07/17/15   Colon Branch, MD  predniSONE (DELTASONE) 10 MG tablet 4 tablets x 2 days, 3 tabs x 2 days, 2 tabs x 2 days, 1 tab x 2 days 07/17/15   Colon Branch, MD  temazepam (RESTORIL) 7.5 MG capsule Take 1-2 capsules (7.5-15 mg total) by mouth at bedtime as needed for sleep. 07/17/15   Colon Branch, MD   BP 200/115 mmHg  Pulse 74  Temp(Src) 98.3 F (36.8 C) (Oral)  Resp 18  Ht 4\' 11"  (1.499 m)  Wt 160 lb (72.576 kg)  BMI 32.30 kg/m2  SpO2 98% Physical Exam  Constitutional: She is oriented to person, place, and time. She appears well-developed and well-nourished. No distress.  HENT:  Head: Normocephalic and atraumatic.  Mouth/Throat: Oropharynx is clear and moist.  Eyes: EOM are normal. Pupils are equal, round, and reactive to light.  Neck: Normal range of motion. Neck supple.  Cardiovascular: Normal rate and regular rhythm.  Exam reveals no friction rub.   No murmur heard. Pulmonary/Chest: Effort normal and breath sounds normal. No respiratory distress. She has no wheezes. She has no rales.  Abdominal: Soft. She exhibits no distension. There is no tenderness. There is no rebound.  Musculoskeletal: Normal range of motion. She exhibits no edema.  Neurological: She is alert and oriented to person, place, and time.  Skin: Skin is warm. No rash noted. She is not diaphoretic.  Nursing note and vitals reviewed.   ED Course  Procedures (including critical care time) DIAGNOSTIC STUDIES: Oxygen Saturation is 98% on RA, normal by my interpretation.    COORDINATION OF CARE: 9:13 PM-Discussed treatment plan which includes labs and chest x-ray with pt at bedside and pt agreed to plan.   Labs Review Labs Reviewed  BASIC METABOLIC PANEL - Abnormal; Notable for the following:    Glucose, Bld 104 (*)    All other components  within normal limits  CBC  BRAIN NATRIURETIC PEPTIDE  TROPONIN I    Imaging Review Dg Chest 2 View  08/01/2015   CLINICAL DATA:  Shortness of breath and chest pain for 5 days  EXAM: CHEST  2 VIEW  COMPARISON:  November 06, 2014  FINDINGS: There is no edema or consolidation. Heart size and pulmonary vascularity are within normal limits. No adenopathy. No pneumothorax. There is stable upper lumbar dextroscoliosis with rotatory component.  IMPRESSION: No edema or consolidation.   Electronically Signed   By: Lowella Grip III M.D.   On: 08/01/2015 22:00      EKG Interpretation  Date/Time:  Thursday August 01 2015 20:45:40 EDT Ventricular Rate:  78 PR Interval:  148 QRS Duration: 86 QT Interval:  384 QTC Calculation: 437 R Axis:   -6 Text Interpretation:  Normal sinus rhythm with sinus arrhythmia Normal ECG  No significant change since last tracing Confirmed by Mingo Amber  MD, Daenerys Buttram  ([id number]) on 08/01/2015 8:49:31 PM      MDM   Final diagnoses:  Chest pain, unspecified chest pain type    44 year old female here with squeezing chest pain. Has had squeezing chest tightness for the past 5 days but acutely worsens while in class. Was not on any noxious fumes. She had some coughing and shortness of breath associated with this also. She states this feels like a cold in her chest. She denies any runny nose, sore throat, fever, headache, blurry vision. She has history of hard to control hypertension. Will give her her nightly meds of clonidine and a beta blocker to see for help with her elevated blood pressure here. Lungs are clear. No wheezing. Will give albuterol seems to cough and mild shortness of breath sensation. She is not a smoker. Initial troponin normal. BP improved with her nightly meds. CP later described as a pressure to me. Will do serial troponin. Atypical, been present for 5 days. Did have some mild relief with NTG. Dr. Leonides Schanz following up on 2nd troponin, anticipate  discharge.   I personally performed the services described in this documentation, which was scribed in my presence. The recorded information has been reviewed and is accurate.     Evelina Bucy, MD 08/02/15 [phone]

## 2015-08-02 ENCOUNTER — Telehealth: Payer: Self-pay | Admitting: Internal Medicine

## 2015-08-02 LAB — TROPONIN I: Troponin I: <0.03 ng/mL (ref <0.031)

## 2015-08-02 NOTE — ED Provider Notes (Signed)
12:35 AM  Assumed care from Dr. Mingo Amber.  Pt here with atypical chest pain.  Plan was for serial troponins.  Troponin x 2 negative.  BP improved with home meds.  Will dc home.  [place], DO 08/02/15 [phone]

## 2015-08-02 NOTE — Discharge Instructions (Signed)

## 2015-08-02 NOTE — Telephone Encounter (Signed)
Was seen at the ER with chest pain, they suggested outpatient evaluation. Please call the patient, set up a office visit (here) for further eval.

## 2015-08-05 NOTE — Telephone Encounter (Signed)
Mailbox full, unable to leave msg to call and schedule appt. I will call again later.

## 2015-08-05 NOTE — Telephone Encounter (Signed)
Abigail Campos-- Can you call pt and arrange ER f/u with  Dr Larose Kells?

## 2015-08-07 ENCOUNTER — Encounter: Payer: Self-pay | Admitting: Internal Medicine

## 2015-08-07 NOTE — Telephone Encounter (Signed)
Called x3, someone picks up and hangs up. Unable to schedule. Mailing letter.

## 2015-08-14 ENCOUNTER — Encounter: Payer: Self-pay | Admitting: Medical

## 2015-08-14 ENCOUNTER — Ambulatory Visit (INDEPENDENT_AMBULATORY_CARE_PROVIDER_SITE_OTHER): Payer: Managed Care, Other (non HMO) | Admitting: Medical

## 2015-08-14 VITALS — BP 128/84 | HR 82 | Temp 98.0°F | Resp 16 | Ht 60.0 in | Wt 163.4 lb

## 2015-08-14 DIAGNOSIS — J208 Acute bronchitis due to other specified organisms: Secondary | ICD-10-CM | POA: Diagnosis not present

## 2015-08-14 DIAGNOSIS — H6502 Acute serous otitis media, left ear: Secondary | ICD-10-CM | POA: Diagnosis not present

## 2015-08-14 MED ORDER — BENZONATATE 100 MG PO CAPS: 100.0000 mg | ORAL_CAPSULE | Freq: Three times a day (TID) | ORAL | Status: DC | PRN

## 2015-08-14 MED ORDER — AZITHROMYCIN 250 MG PO TABS: ORAL_TABLET | ORAL | Status: DC

## 2015-08-14 MED ORDER — ALBUTEROL SULFATE HFA 108 (90 BASE) MCG/ACT IN AERS: 2.0000 | INHALATION_SPRAY | Freq: Four times a day (QID) | RESPIRATORY_TRACT | Status: DC | PRN

## 2015-08-14 NOTE — Patient Instructions (Signed)
For bronchitis and lt om rx of azithromycin.  For your cough will rx benzonatate.  Also making albuterol available to use if needed as instructed.  If cough not improving or worse symptoms then would recommend getting cxr.   Follow up in 7 days or as needed.

## 2015-08-14 NOTE — Progress Notes (Signed)
Subjective:    Patient ID: Abigail Campos, female    DOB: Jun 13, 1971, 44 y.o.   MRN: [id number]  HPI   Pt states cough for about a week. She states mild nasal congestion at that time. Describes worst symptom is the cough.  Describes constant cough. Pt is Control and instrumentation engineer. Pt states cough is dry. No wheezing or sob. No fever or chills. Pt states transient chest wall and rib pain when she cough. No wheezing.  Pt was seen at ED at beginning of this illness. She had atypical chest pain day before onset of illness.  Negative work up at that time. No recurrent type symptoms.  Note when she coughs only describes one second transient chest pain at times.    Review of Systems  Constitutional: Negative for fever, chills and fatigue.  HENT: Positive for postnasal drip. Negative for congestion, drooling, hearing loss, mouth sores, rhinorrhea, sinus pressure and sore throat.   Respiratory: Positive for cough. Negative for choking, chest tightness, shortness of breath and wheezing.   Cardiovascular: Negative for chest pain and palpitations.  Gastrointestinal: Negative for abdominal pain.  Musculoskeletal: Negative for back pain.  Neurological: Negative for dizziness, light-headedness, numbness and headaches.  Hematological: Negative for adenopathy. Does not bruise/bleed easily.  Psychiatric/Behavioral: Negative for behavioral problems and confusion.   Past Medical History  Diagnosis Date  . Headache(784.0)     developed migraines after 2nd pregnancy, had a negative CT of the head  . Hypertension     Social History   Social History  . Marital Status: Married    Spouse Name: N/A  . Number of Children: 2  . Years of Education: N/A   Occupational History  . TEACHER    Social History Main Topics  . Smoking status: Never Smoker   . Smokeless tobacco: Never Used  . Alcohol Use: No  . Drug Use: No  . Sexual Activity: Not on file   Other Topics Concern  . Not on file   Social  History Narrative    Past Surgical History  Procedure Laterality Date  . Inguinal hernia repair      03-27-2003  . Cesarean section      [id number]  . Abdominal hysterectomy  11-14-07    no oophorectomy per surgical report    Family History  Problem Relation Age of Onset  . Diabetes Mother   . Hypertension Mother   . Glaucoma Mother   . Heart disease      2 uncles died 2011-03-27 from CAD-CHF  . Stroke Maternal Grandfather   . Colon cancer Mother     mother age 59  . Diabetes Other     siblings  . Hypertension Other     siblings    Allergies  Allergen Reactions  . Hydrocodone Hives    Generalize itching w/o rash  . Ambien [Zolpidem Tartrate] Other (See Comments)    forgetfulness  . Aspirin Swelling    Reports blisters w/ ASA but pt reports is ok w/ Motrin, Advil, naproxen  . Latex Hives  . Other     Bee stings  . Peanut Butter Flavor Swelling    Throat swells  . Peanut-Containing Drug Products     Current Outpatient Prescriptions on File Prior to Visit  Medication Sig Dispense Refill  . Cholecalciferol (VITAMIN D PO) Take by mouth.    . cloNIDine (CATAPRES) 0.1 MG tablet Take 1 tablet (0.1 mg total) by mouth 2 (two) times daily. 60 tablet 4  .  felodipine (PLENDIL) 10 MG 24 hr tablet Take 1 tablet (10 mg total) by mouth daily. 30 tablet 4  . losartan-hydrochlorothiazide (HYZAAR) 100-25 MG per tablet Take 1 tablet by mouth daily. 30 tablet 4  . METFORMIN HCL PO Take by mouth.    . Multiple Vitamin (MULTIVITAMIN) tablet Take 1 tablet by mouth 2 (two) times daily.      . Nebivolol HCl 20 MG TABS Take 1 tablet (20 mg total) by mouth 2 (two) times daily. 60 tablet 4  . predniSONE (DELTASONE) 10 MG tablet 4 tablets x 2 days, 3 tabs x 2 days, 2 tabs x 2 days, 1 tab x 2 days 20 tablet 0  . temazepam (RESTORIL) 7.5 MG capsule Take 1-2 capsules (7.5-15 mg total) by mouth at bedtime as needed for sleep. 60 capsule 0   No current facility-administered medications on file prior to visit.     BP 128/84 mmHg  Pulse 82  Temp(Src) 98 F (36.7 C) (Oral)  Resp 16  Ht 5' (1.524 m)  Wt 163 lb 6.4 oz (74.118 kg)  BMI 31.91 kg/m2  SpO2 98%       Objective:   Physical Exam   General  Mental Status - Alert. General Appearance - Well groomed. Not in acute distress.  Skin Rashes- No Rashes.  HEENT Head- Normal. Ear Auditory Canal - Left- Normal. Right - Normal.Tympanic Membrane- Left- red.  Right- Normal. Eye Sclera/Conjunctiva- Left- Normal. Right- Normal. Nose & Sinuses Nasal Mucosa- Left-  Boggy and Congested. Right-   No Boggy and no Congested.Bilateral  No maxillary and  No frontal sinus pressure. Mouth & Throat Lips: Upper Lip- Normal: no dryness, cracking, pallor, cyanosis, or vesicular eruption. Lower Lip-Normal: no dryness, cracking, pallor, cyanosis or vesicular eruption. Buccal Mucosa- Bilateral- No Aphthous ulcers. Oropharynx- No Discharge or Erythema. +pnd. Tonsils: Characteristics- Bilateral- No Erythema or Congestion. Size/Enlargement- Bilateral- No enlargement. Discharge- bilateral-None.  Neck Neck- Supple. No Masses.   Chest and Lung Exam Auscultation: Breath Sounds:-Clear even and unlabored.  Cardiovascular Auscultation:Rythm- Regular, rate and rhythm. Murmurs & Other Heart Sounds:Ausculatation of the heart reveal- No Murmurs.  Lymphatic Head & Neck General Head & Neck Lymphatics: Bilateral: Description- No Localized lymphadenopathy.        Assessment & Plan:  For bronchitis and lt om rx of azithromycin.  For your cough will rx benzonatate.  Also making albuterol available to use if needed as instructed.  If cough not improving or worse symptoms then would recommend getting cxr.   Follow up in 7 days or as needed.

## 2015-08-14 NOTE — Progress Notes (Signed)
Pre visit review using our clinic review tool, if applicable. No additional management support is needed unless otherwise documented below in the visit note. 

## 2015-09-25 ENCOUNTER — Ambulatory Visit (INDEPENDENT_AMBULATORY_CARE_PROVIDER_SITE_OTHER): Payer: Managed Care, Other (non HMO) | Admitting: Internal Medicine

## 2015-09-25 ENCOUNTER — Encounter: Payer: Self-pay | Admitting: Internal Medicine

## 2015-09-25 VITALS — BP 118/74 | HR 70 | Temp 98.1°F | Ht 60.0 in | Wt 163.1 lb

## 2015-09-25 DIAGNOSIS — G4700 Insomnia, unspecified: Secondary | ICD-10-CM

## 2015-09-25 DIAGNOSIS — Z09 Encounter for follow-up examination after completed treatment for conditions other than malignant neoplasm: Secondary | ICD-10-CM | POA: Insufficient documentation

## 2015-09-25 DIAGNOSIS — R05 Cough: Secondary | ICD-10-CM

## 2015-09-25 DIAGNOSIS — R059 Cough, unspecified: Secondary | ICD-10-CM

## 2015-09-25 MED ORDER — FLUCONAZOLE 150 MG PO TABS: 150.0000 mg | ORAL_TABLET | Freq: Once | ORAL | Status: DC

## 2015-09-25 MED ORDER — TRAMADOL HCL 50 MG PO TABS: 25.0000 mg | ORAL_TABLET | Freq: Three times a day (TID) | ORAL | Status: DC | PRN

## 2015-09-25 MED ORDER — DOXYCYCLINE HYCLATE 100 MG PO TBEC: 100.0000 mg | DELAYED_RELEASE_TABLET | Freq: Two times a day (BID) | ORAL | Status: DC

## 2015-09-25 NOTE — Progress Notes (Signed)
Subjective:    Patient ID: Abigail Campos, female    DOB: 1971-05-20, 44 y.o.   MRN: [id number]  DOS:  09/25/2015 Type of visit - description : Acute Interval history: Was seen at a urgent care earlier in August with bronchitis. No abx. The only symptom was cough. ER visit 08/01/2015: Chest pain, BMP, CBC, troponins negative.chest x-ray negative. Prescribed albuterol x 1 due to shortness of breath, helped? Visit here a 08-14-15: Diagnosed with bronchitis, prescribe a Z-Pak, Tessalon Perles, prednisone and Proventil  Review of Systems at this point, she continue with cough which is persistent.She is using Proventil and for the most part causes nausea, does not to stop the cough, it does help her bringing more clear mucus.  no fever or chills No sinus pain, congestion, postnasal dripping no nasal discharge No chest pain, difficulty breathing, edema. No heartburn. Occasional clear sputum production without wheezing or hemoptysis.  Past Medical History  Diagnosis Date  . Headache(784.0)     developed migraines after 2nd pregnancy, had a negative CT of the head  . Hypertension     Past Surgical History  Procedure Laterality Date  . Inguinal hernia repair      2004  . Cesarean section      [id number]  . Abdominal hysterectomy  11-14-07    no oophorectomy per surgical report    Social History   Social History  . Marital Status: Married    Spouse Name: N/A  . Number of Children: 2  . Years of Education: N/A   Occupational History  . TEACHER    Social History Main Topics  . Smoking status: Never Smoker   . Smokeless tobacco: Never Used  . Alcohol Use: No  . Drug Use: No  . Sexual Activity: Not on file   Other Topics Concern  . Not on file   Social History Narrative        Medication List       This list is accurate as of: 09/25/15  6:05 PM.  Always use your most recent med list.               albuterol 108 (90 BASE) MCG/ACT inhaler  Commonly known  as:  PROVENTIL HFA;VENTOLIN HFA  Inhale 2 puffs into the lungs every 6 (six) hours as needed for wheezing or shortness of breath.     cloNIDine 0.1 MG tablet  Commonly known as:  CATAPRES  Take 1 tablet (0.1 mg total) by mouth 2 (two) times daily.     doxycycline 100 MG EC tablet  Commonly known as:  DORYX  Take 1 tablet (100 mg total) by mouth 2 (two) times daily.     felodipine 10 MG 24 hr tablet  Commonly known as:  PLENDIL  Take 1 tablet (10 mg total) by mouth daily.     fluconazole 150 MG tablet  Commonly known as:  DIFLUCAN  Take 1 tablet (150 mg total) by mouth once. Repeat if needed.     losartan-hydrochlorothiazide 100-25 MG tablet  Commonly known as:  HYZAAR  Take 1 tablet by mouth daily.     METFORMIN HCL PO  Take by mouth.     multivitamin tablet  Take 1 tablet by mouth 2 (two) times daily.     Nebivolol HCl 20 MG Tabs  Take 1 tablet (20 mg total) by mouth 2 (two) times daily.     temazepam 7.5 MG capsule  Commonly known as:  RESTORIL  Take 1-2 capsules (  7.5-15 mg total) by mouth at bedtime as needed for sleep.     traMADol 50 MG tablet  Commonly known as:  ULTRAM  Take 0.5-1 tablets (25-50 mg total) by mouth every 8 (eight) hours as needed (cough).     VITAMIN D PO  Take by mouth.           Objective:   Physical Exam BP 118/74 mmHg  Pulse 70  Temp(Src) 98.1 F (36.7 C) (Oral)  Ht 5' (1.524 m)  Wt 163 lb 2 oz (73.993 kg)  BMI 31.86 kg/m2  SpO2 98% General:   Well developed, well nourished . NAD.  HEENT:  Normocephalic . Face symmetric, atraumatic Tympanic membranes normal, nose not congested, sinuses not TTP. Lungs:  CTA B Normal respiratory effort, no intercostal retractions, no accessory muscle use. Heart: RRR,  no murmur.  No pretibial edema bilaterally  Skin: Not pale. Not jaundice Neurologic:  alert & oriented X3.  Speech normal, gait appropriate for age and unassisted Psych--  Cognition and judgment appear intact.  Cooperative  with normal attention span and concentration.  Behavior appropriate. No anxious or depressed appearing.      Assessment & Plan:   Assessment > HTN Headaches , migraines dx after 2nd pregnancy, (-) CT of the head Insomnia   Plan Cough: Cough going on for almost 2 months, at the onset of the symptoms she did not have classic URI sx (no postnasal dripping, watery eyes etc.) simply Cough. Review of systems negative for postnasal dripping or GERD. albuterol is not helping much. Atypical infection? Related to losartan which rarely  may cause cough? RX: Doxycycline Mucinex DM twice a day for 10 days Would like to try hydrocodone but she is allergic to it,we agreed to try Ultram which has some cough suppressant properties. Watch for allergies and call 911 if she has any severe allergies. Empiric Prilosec Call if not better in 10 days   pulmonary referral? Insomnia: Check a UDS  RTC 3 months for a CPX

## 2015-09-25 NOTE — Progress Notes (Signed)
Pre visit review using our clinic review tool, if applicable. No additional management support is needed unless otherwise documented below in the visit note. 

## 2015-09-25 NOTE — Assessment & Plan Note (Signed)
Cough: Cough going on for almost 2 months, at the onset of the symptoms she did not have classic URI sx (no postnasal dripping, watery eyes etc.) simply Cough. Review of systems negative for postnasal dripping or GERD. albuterol is not helping much. Atypical infection? Related to losartan which rarely  may cause cough? RX: Doxycycline Mucinex DM twice a day for 10 days Would like to try hydrocodone but she is allergic to it,we agreed to try Ultram which has some cough suppressant properties. Watch for allergies and call 911 if she has any severe allergies. Empiric Prilosec Call if not better in 10 days   pulmonary referral? Insomnia: Check a UDS  RTC 3 months for a CPX

## 2015-09-25 NOTE — Patient Instructions (Addendum)
Go to the lab for a urine sample  Mucinex DM OTC twice a day for 10 days then as needed ULTRAM as  needed for cough . Watch for allergies Start omeprazole OTC 20 mg: 2 tablets every morning before breakfast for 14 days Doxycycline as prescribed Call in 10-14 days if you are not better Refer you to a specialist  Next visit in 3 months, fasting for a physical exam. Please make an appointment

## 2015-09-26 ENCOUNTER — Ambulatory Visit: Payer: Managed Care, Other (non HMO) | Admitting: Internal Medicine

## 2015-10-03 ENCOUNTER — Telehealth: Payer: Self-pay

## 2015-10-03 NOTE — Telephone Encounter (Signed)
UDS: 09/25/2015   Negative for Temazepam:PRN Negative for Tramadol: okay, Pt just started   Low risk per Dr. Larose Kells 10/03/2015

## 2015-11-11 ENCOUNTER — Encounter: Payer: Self-pay | Admitting: Internal Medicine

## 2015-11-11 ENCOUNTER — Ambulatory Visit (INDEPENDENT_AMBULATORY_CARE_PROVIDER_SITE_OTHER): Payer: Managed Care, Other (non HMO) | Admitting: Internal Medicine

## 2015-11-11 VITALS — BP 126/84 | HR 76 | Temp 98.2°F | Ht 60.0 in | Wt 165.2 lb

## 2015-11-11 DIAGNOSIS — R05 Cough: Secondary | ICD-10-CM

## 2015-11-11 DIAGNOSIS — I10 Essential (primary) hypertension: Secondary | ICD-10-CM

## 2015-11-11 DIAGNOSIS — Z09 Encounter for follow-up examination after completed treatment for conditions other than malignant neoplasm: Secondary | ICD-10-CM

## 2015-11-11 DIAGNOSIS — R053 Chronic cough: Secondary | ICD-10-CM

## 2015-11-11 MED ORDER — TRAMADOL HCL 50 MG PO TABS: 25.0000 mg | ORAL_TABLET | Freq: Three times a day (TID) | ORAL | Status: DC | PRN

## 2015-11-11 MED ORDER — TRIAMTERENE-HCTZ 37.5-25 MG PO TABS: 1.0000 | ORAL_TABLET | Freq: Every day | ORAL | Status: DC

## 2015-11-11 MED ORDER — BUDESONIDE-FORMOTEROL FUMARATE 160-4.5 MCG/ACT IN AERO: 2.0000 | INHALATION_SPRAY | Freq: Two times a day (BID) | RESPIRATORY_TRACT | Status: DC

## 2015-11-11 NOTE — Assessment & Plan Note (Signed)
Cough: Persisting cough, no better despite multiple interventions, see previous visits. Plan:  Discontinue ARBs as they may rarely cause cough. Asthma? Trial with Symbicort Refer to pulmonary HTN: DC losartan HCT, rx maxzide, monitor BPs, check a BMP in 2 weeks RTC 3 months for a physical. See instructions

## 2015-11-11 NOTE — Patient Instructions (Addendum)
Stop losartan HCT  Start Maxzide   Please schedule labs to be done in 2 weeks (no  fasting)  Check the  blood pressure 2 or 3 times a  week Be sure your blood pressure is between 110/65 and  145/85.  if it is consistently higher or lower, let me know   Start Symbicort 2 puffs twice a day until you see the specialists   Next visit  for a physical exam in 2-3  months, fasting   Please schedule an appointment at the front desk

## 2015-11-11 NOTE — Progress Notes (Signed)
Subjective:    Patient ID: Abigail Campos, female    DOB: 17-Mar-1971, 44 y.o.   MRN: [id number]  DOS:  11/11/2015 Type of visit - description : Acute, not feeling better Interval history: Was seen recently with chronic cough, was prescribed doxycycline, ultram, Prilosec: Did have mild improvement but symptoms are resurfacing  In the last few days  Review of Systems No fever chills No nasal discharge No GERD Occasional chest rattling, some response to albuterol  Past Medical History  Diagnosis Date  . Headache(784.0)     developed migraines after 2nd pregnancy, had a negative CT of the head  . Hypertension     Past Surgical History  Procedure Laterality Date  . Inguinal hernia repair      2004  . Cesarean section      [id number]  . Abdominal hysterectomy  11-14-07    no oophorectomy per surgical report    Social History   Social History  . Marital Status: Married    Spouse Name: N/A  . Number of Children: 2  . Years of Education: N/A   Occupational History  . TEACHER    Social History Main Topics  . Smoking status: Never Smoker   . Smokeless tobacco: Never Used  . Alcohol Use: No  . Drug Use: No  . Sexual Activity: Not on file   Other Topics Concern  . Not on file   Social History Narrative        Medication List       This list is accurate as of: 11/11/15  7:24 PM.  Always use your most recent med list.               albuterol 108 (90 BASE) MCG/ACT inhaler  Commonly known as:  PROVENTIL HFA;VENTOLIN HFA  Inhale 2 puffs into the lungs every 6 (six) hours as needed for wheezing or shortness of breath.     budesonide-formoterol 160-4.5 MCG/ACT inhaler  Commonly known as:  SYMBICORT  Inhale 2 puffs into the lungs 2 (two) times daily.     cloNIDine 0.1 MG tablet  Commonly known as:  CATAPRES  Take 1 tablet (0.1 mg total) by mouth 2 (two) times daily.     felodipine 10 MG 24 hr tablet  Commonly known as:  PLENDIL  Take 1 tablet (10 mg  total) by mouth daily.     fluconazole 150 MG tablet  Commonly known as:  DIFLUCAN  Take 1 tablet (150 mg total) by mouth once. Repeat if needed.     METFORMIN HCL PO  Take by mouth.     multivitamin tablet  Take 1 tablet by mouth 2 (two) times daily.     Nebivolol HCl 20 MG Tabs  Take 1 tablet (20 mg total) by mouth 2 (two) times daily.     temazepam 7.5 MG capsule  Commonly known as:  RESTORIL  Take 1-2 capsules (7.5-15 mg total) by mouth at bedtime as needed for sleep.     traMADol 50 MG tablet  Commonly known as:  ULTRAM  Take 0.5-1 tablets (25-50 mg total) by mouth every 8 (eight) hours as needed (cough).     triamterene-hydrochlorothiazide 37.5-25 MG tablet  Commonly known as:  MAXZIDE-25  Take 1 tablet by mouth daily.     VITAMIN D PO  Take by mouth.           Objective:   Physical Exam BP 126/84 mmHg  Pulse 76  Temp(Src) 98.2 F (36.8  C) (Oral)  Ht 5' (1.524 m)  Wt 165 lb 4 oz (74.957 kg)  BMI 32.27 kg/m2  SpO2 97% General:   Well developed, well nourished . NAD.  HEENT:  Normocephalic . Face symmetric, atraumatic Lungs:  CTA B. No wheezing but + small amount of large airway congestion with cough Normal respiratory effort, no intercostal retractions, no accessory muscle use. Heart: RRR,  no murmur.  No pretibial edema bilaterally  Skin: Not pale. Not jaundice Neurologic:  alert & oriented X3.  Speech normal, gait appropriate for age and unassisted Psych--  Cognition and judgment appear intact.  Cooperative with normal attention span and concentration.  Behavior appropriate. No anxious or depressed appearing.      Assessment & Plan:    Assessment > HTN Headaches , migraines dx after 2nd pregnancy, (-) CT of the head Insomnia   Plan Cough: Persisting cough, no better despite multiple interventions, see previous visits. Plan:  Discontinue ARBs as they may rarely cause cough. Asthma? Trial with Symbicort Refer to pulmonary HTN: DC  losartan HCT, rx maxzide, monitor BPs, check a BMP in 2 weeks RTC 3 months for a physical. See instructions

## 2015-11-11 NOTE — Progress Notes (Signed)
Pre visit review using our clinic review tool, if applicable. No additional management support is needed unless otherwise documented below in the visit note. 

## 2015-11-12 ENCOUNTER — Encounter: Payer: Managed Care, Other (non HMO) | Admitting: Internal Medicine

## 2015-12-06 ENCOUNTER — Encounter: Payer: Self-pay | Admitting: Internal Medicine

## 2015-12-06 ENCOUNTER — Ambulatory Visit (INDEPENDENT_AMBULATORY_CARE_PROVIDER_SITE_OTHER)
Admission: RE | Admit: 2015-12-06 | Discharge: 2015-12-06 | Disposition: A | Discharge status: Home / Self Care | Payer: Managed Care, Other (non HMO) | Source: Ambulatory Visit | Attending: Internal Medicine | Admitting: Internal Medicine

## 2015-12-06 ENCOUNTER — Ambulatory Visit (INDEPENDENT_AMBULATORY_CARE_PROVIDER_SITE_OTHER): Payer: Managed Care, Other (non HMO) | Admitting: Internal Medicine

## 2015-12-06 VITALS — BP 166/100 | HR 98 | Ht 59.0 in | Wt 162.0 lb

## 2015-12-06 DIAGNOSIS — R058 Other specified cough: Secondary | ICD-10-CM | POA: Insufficient documentation

## 2015-12-06 DIAGNOSIS — R05 Cough: Secondary | ICD-10-CM

## 2015-12-06 DIAGNOSIS — I10 Essential (primary) hypertension: Secondary | ICD-10-CM

## 2015-12-06 IMAGING — DX DG CHEST 2V
2 series · 2 of 2 positions shown · non-contrast
Comparison: [DATE]

CLINICAL DATA: Productive cough.  Symptoms since [REDACTED]

EXAM:
CHEST  2 VIEW

[chest pa]
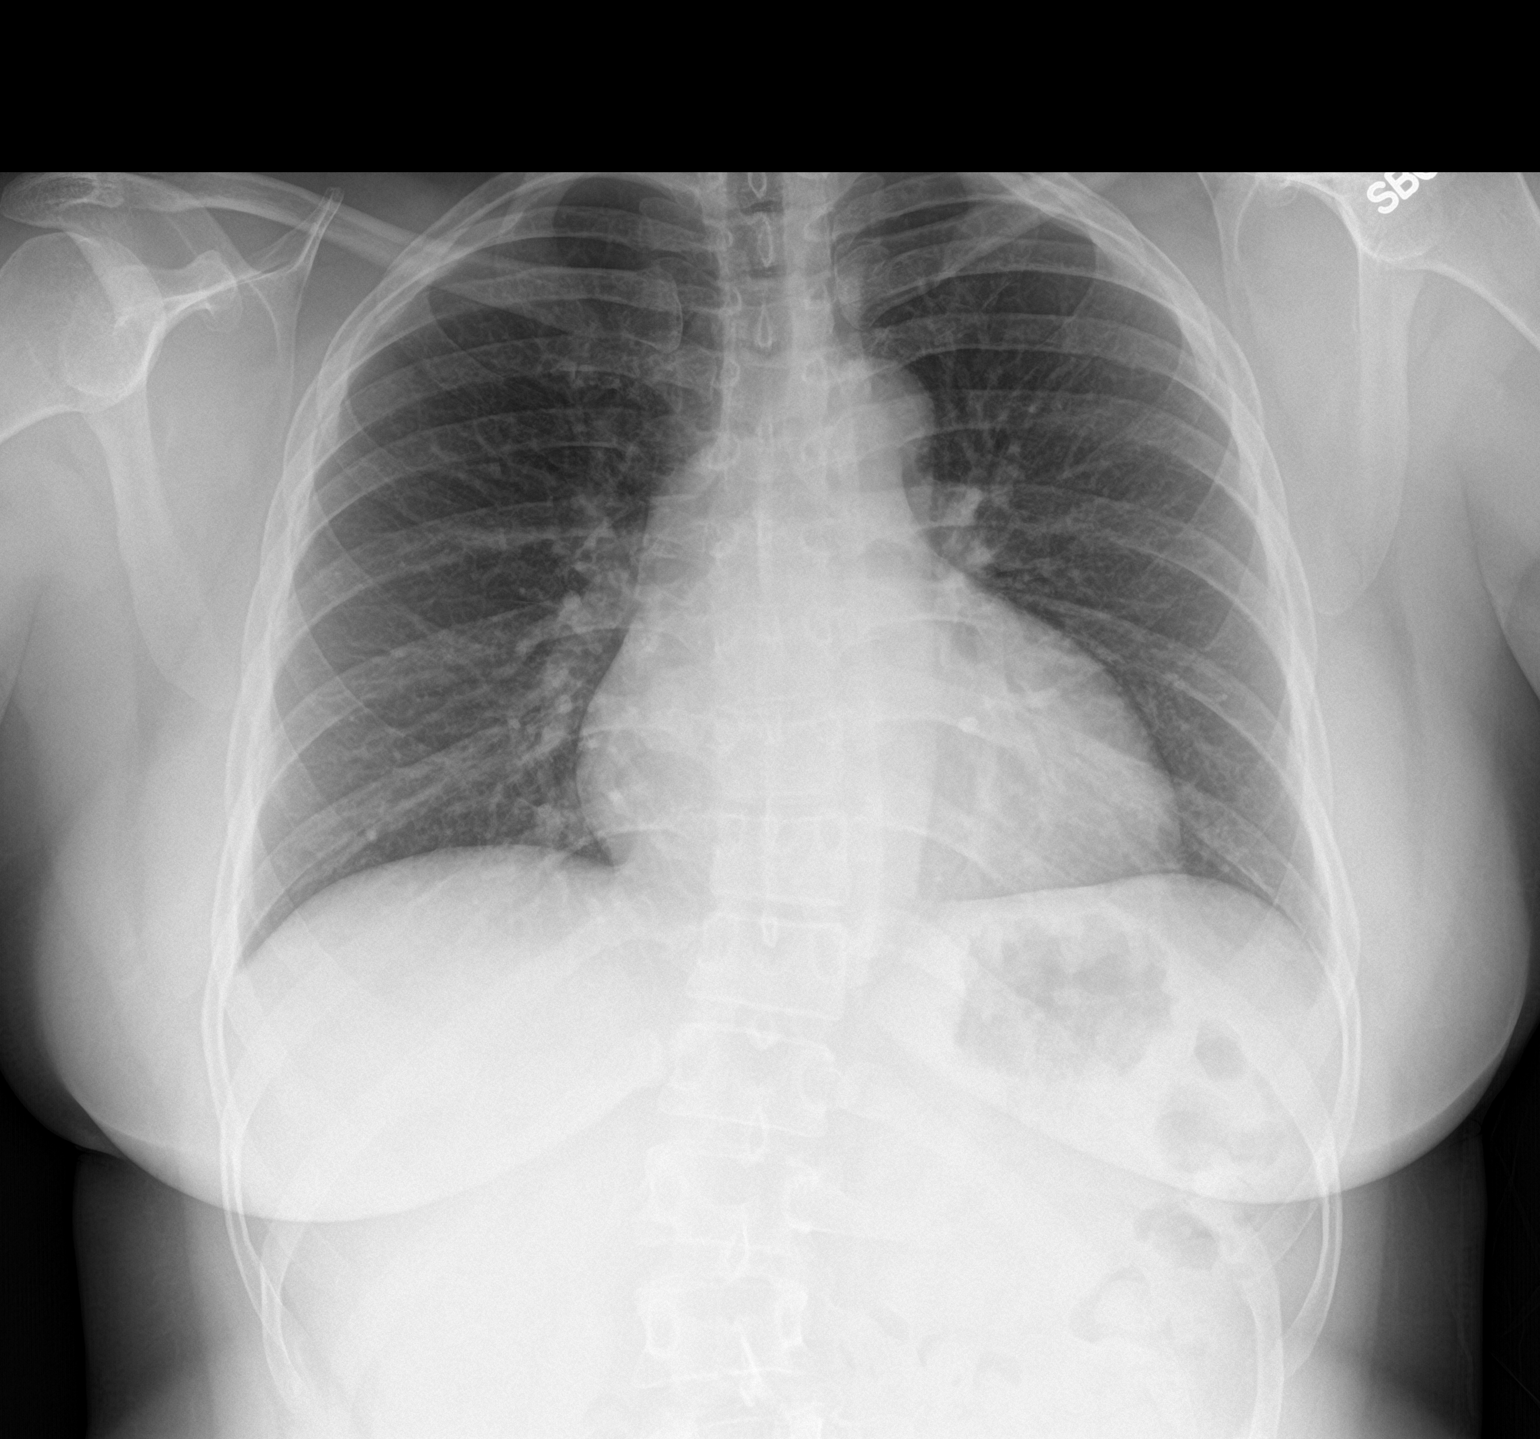

[chest lat]
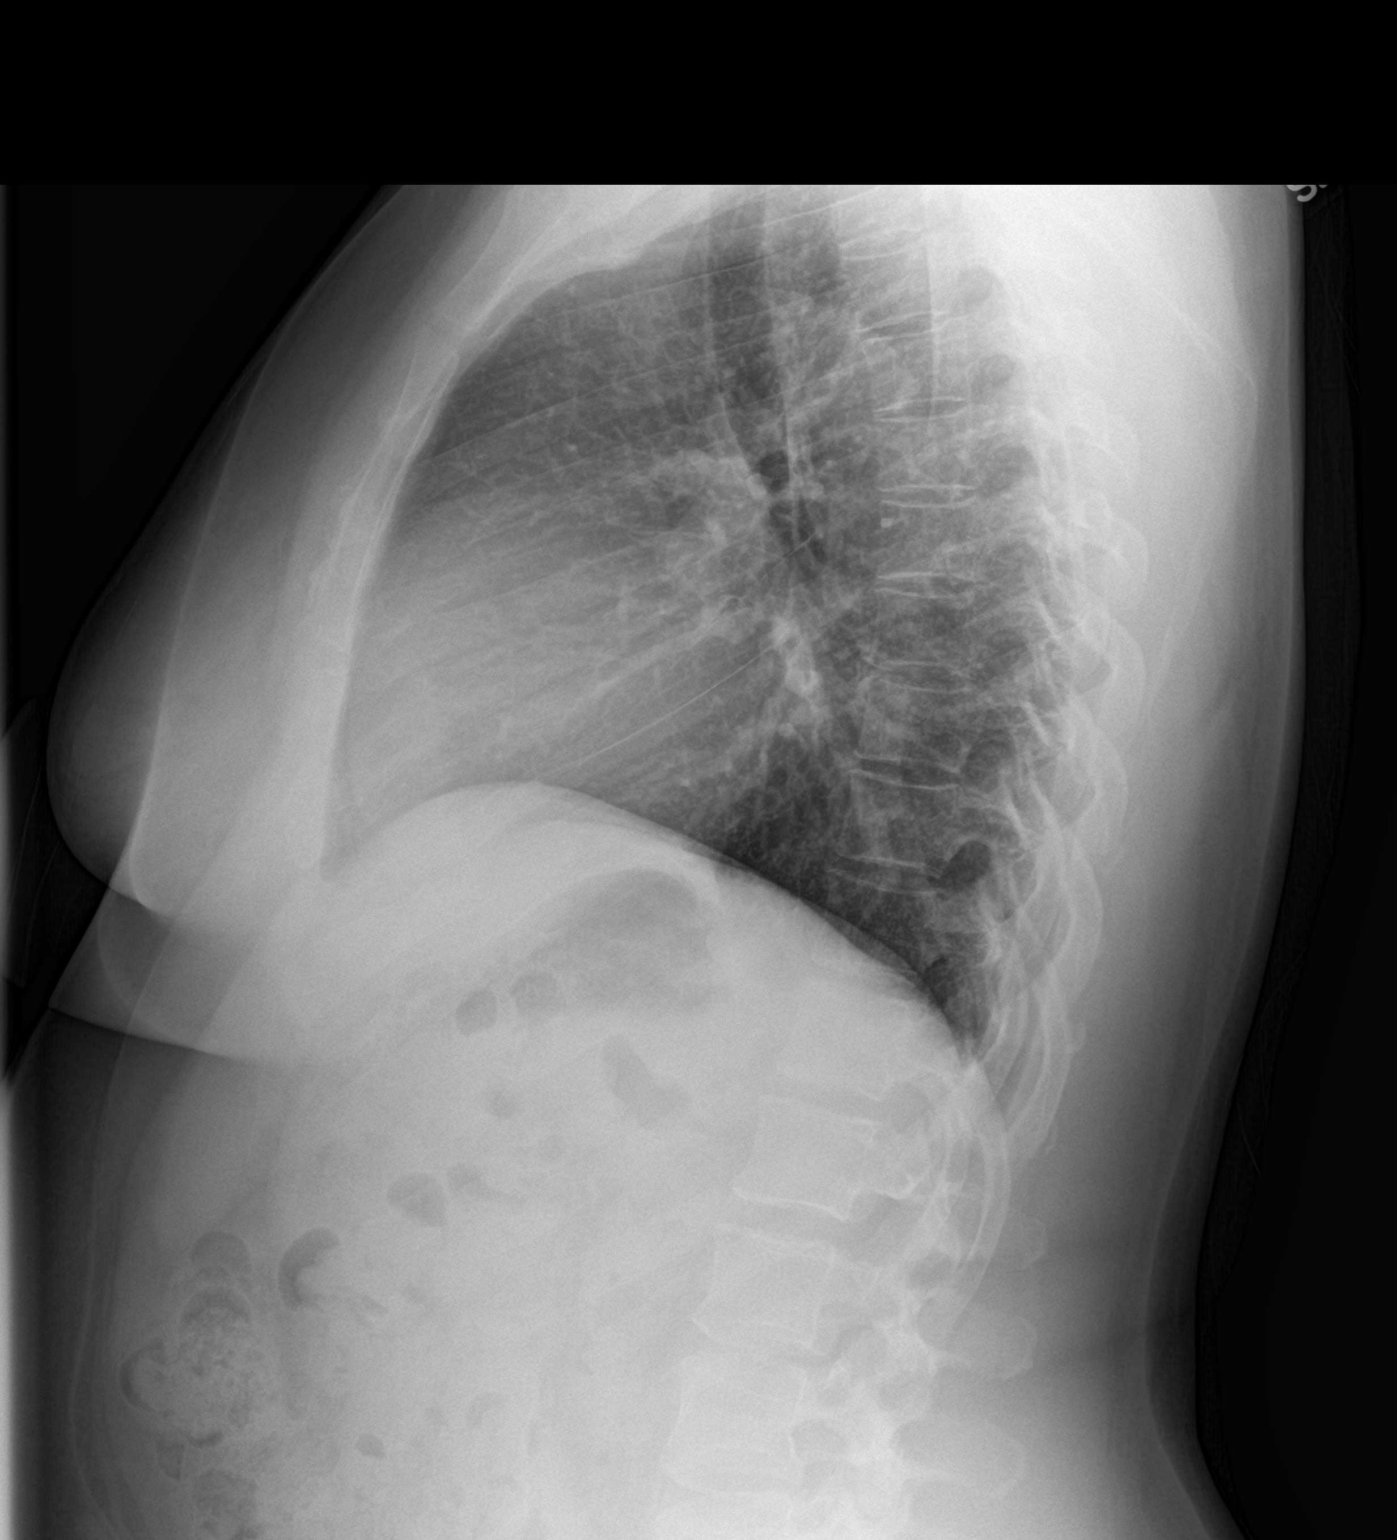

[2 of 2 positions shown; findings below may reference images not displayed]

FINDINGS: Heart is borderline in size. Lungs are clear. No effusions or edema.
No acute bony abnormality.
IMPRESSION: No active cardiopulmonary disease.

## 2015-12-06 MED ORDER — MEPERIDINE HCL 50 MG PO TABS: 50.0000 mg | ORAL_TABLET | ORAL | Status: DC | PRN

## 2015-12-06 MED ORDER — BENZONATATE 200 MG PO CAPS: ORAL_CAPSULE | ORAL | Status: DC

## 2015-12-06 MED ORDER — PANTOPRAZOLE SODIUM 40 MG PO TBEC: 40.0000 mg | DELAYED_RELEASE_TABLET | Freq: Every day | ORAL | Status: DC

## 2015-12-06 MED ORDER — FAMOTIDINE 20 MG PO TABS: ORAL_TABLET | ORAL | Status: DC

## 2015-12-06 MED ORDER — PREDNISONE 10 MG PO TABS: ORAL_TABLET | ORAL | Status: DC

## 2015-12-06 NOTE — Assessment & Plan Note (Addendum)
The most common causes of chronic cough in immunocompetent adults include the following: upper airway cough syndrome (UACS), previously referred to as postnasal drip syndrome (PNDS), which is caused by variety of rhinosinus conditions; (2) asthma; (3) GERD; (4) chronic bronchitis from cigarette smoking or other inhaled environmental irritants; (5) nonasthmatic eosinophilic bronchitis; and (6) bronchiectasis.   These conditions, singly or in combination, have accounted for up to 94% of the causes of chronic cough in prospective studies.   Other conditions have constituted no >6% of the causes in prospective studies These have included bronchogenic carcinoma, chronic interstitial pneumonia, sarcoidosis, left ventricular failure, ACEI-induced cough, and aspiration from a condition associated with pharyngeal dysfunction.    Chronic cough is often simultaneously caused by more than one condition. A single cause has been found from 38 to 82% of the time, multiple causes from 18 to 62%. Multiply caused cough has been the result of three diseases up to 42% of the time.       Based on hx and exam, this is most likely:  Classic Upper airway cough syndrome, so named because it's frequently impossible to sort out how much is  CR/sinusitis with freq throat clearing (which can be related to primary GERD)   vs  causing  secondary (" extra esophageal")  GERD from wide swings in gastric pressure that occur with throat clearing, often  promoting self use of mint and menthol lozenges that reduce the lower esophageal sphincter tone and exacerbate the problem further in a cyclical fashion.   These are the same pts (now being labeled as having "irritable larynx syndrome" by some cough centers) who not infrequently have a history of having failed to tolerate ace inhibitors,  dry powder inhalers(and sometimes even hfa ICS like symbicort) or biphosphonates or report having atypical reflux symptoms that don't respond to standard  doses of PPI , and are easily confused as having aecopd or asthma flares by even experienced allergists/ pulmonologists.   The first step is to maximize GERD rx/ try 1st gen h1 to stop pnds/  and eliminate cyclical coughing with tessilon/ demerol prn  then regroup if the cough persists in 2 weeks  I had an extended discussion with the patient reviewing all relevant studies completed to date and  lasting 35/60 min  1) Explained: The standardized cough guidelines published in Chest by Lissa Morales in 2006 are still the best available and consist of a multiple step process (up to 12!) , not a single office visit,  and are intended  to address this problem logically,  with an alogrithm dependent on response to empiric treatment at  each progressive step  to determine a specific diagnosis with  minimal addtional testing needed. Therefore if adherence is an issue or can't be accurately verified,  it's very unlikely the standard evaluation and treatment will be successful here.    Furthermore, response to therapy (other than acute cough suppression, which should only be used short term with avoidance of narcotic containing cough syrups if possible), can be a gradual process for which the patient may perceive immediate benefit.  Unlike going to an eye doctor where the best perscription is almost always the first one and is immediately effective, this is almost never the case in the management of chronic cough syndromes. Therefore the patient needs to commit up front to consistently adhere to recommendations  for up to 6 weeks of therapy directed at the likely underlying problem(s) before the response can be reasonably evaluated.  2) Each maintenance medication was reviewed in detail including most importantly the difference between maintenance and prns and under what circumstances the prns are to be triggered using an action plan format that is not reflected in the computer generated alphabetically  organized AVS.    Please see instructions for details which were reviewed in writing and the patient given a copy highlighting the part that I personally wrote and discussed at today's ov.   See instructions for specific recommendations which were reviewed directly with the patient who was given a copy with highlighter outlining the key components.

## 2015-12-06 NOTE — Patient Instructions (Addendum)
For cough start tessalon 200 mg four times daily and if still can't stop add demerol 50 mg up to 2 every 4 hours but you must stop all coughing  Prednisone Take 4 for two days three for two days two for two days one for two days   Pantoprazole (protonix) 40 mg   Take  30-60 min before first meal of the day and Pepcid (famotidine)  20 mg one @  bedtime until cough gone for 2 weeks without the need for any cough meds  For drainage / throat tickle try take CHLORPHENIRAMINE  4 mg - take one every 4 hours as needed - available over the counter- may cause drowsiness so start with just a bedtime dose or two and see how you tolerate it before trying in daytime    GERD (REFLUX)  is an extremely common cause of respiratory symptoms just like yours , many times with no obvious heartburn at all.    It can be treated with medication, but also with lifestyle changes including elevation of the head of your bed (ideally with 6 inch  bed blocks),  Smoking cessation, avoidance of late meals, excessive alcohol, and avoid fatty foods, chocolate, peppermint, colas, red wine, and acidic juices such as orange juice.  NO MINT OR MENTHOL PRODUCTS SO NO COUGH DROPS  USE SUGARLESS CANDY INSTEAD (Jolley ranchers or Stover's or Life Savers) or even ice chips will also do - the key is to swallow to prevent all throat clearing. NO OIL BASED VITAMINS - use powdered substitutes.  Stop symbicort   Please remember to go to the x-ray department downstairs for your tests - we will call you with the results when they are available.  Return in 2 weeks with all meds in hand if not better

## 2015-12-06 NOTE — Progress Notes (Signed)
   Subjective:    Patient ID: Abigail Campos, female    DOB: [birth year],  MRN: [id number]  HPI 34 yobf never smoker never h/o resp problems prev by Abigail Campos for hives (but not resp)  allegies to latex / asa / bee stings then had bad cough winter 2015 rx with short term proair seemed better then cough recurred  in Aug 2016 and tried inhaler again but did not work, changed to symbiocort no better so referred to pulmonary clinic 12/06/2015 by Dr Abigail Campos   12/06/2015 1st [facility] office visit/ Abigail Campos   Chief Complaint  Patient presents with  . Pulmonary Consult    Referred by Dr. Larose Campos. Pt c/o cough since August 2016. She states cough has been prod recently with yellow/brown sputum. She states that when she coughs, it feels like "something snapping in my chest".  She also has been having night sweats and occ SOB with or without any exertion.   started abruptly 07/2015 > er with coughing so hard developed midline cp s a typical uri  Presentation  No better with inhalers / 80% improved while on pearls and pred then worse again  Coughs so hard gags/ variably productive scant beige/ not purulent mucus  Peaks at hs, better around lunch / only sob when coughing     Review of Systems  Constitutional: Negative for fever, chills and unexpected weight change.  HENT: Positive for dental problem. Negative for congestion, ear pain, nosebleeds, postnasal drip, rhinorrhea, sinus pressure, sneezing, sore throat, trouble swallowing and voice change.   Eyes: Negative for visual disturbance.  Respiratory: Positive for cough and shortness of breath. Negative for choking.   Cardiovascular: Positive for chest pain. Negative for leg swelling.  Gastrointestinal: Negative for vomiting, abdominal pain and diarrhea.  Genitourinary: Negative for difficulty urinating.  Musculoskeletal: Negative for arthralgias.  Skin: Negative for rash.  Neurological: Positive for headaches. Negative for tremors and syncope.    Hematological: Does not bruise/bleed easily.       Objective:   Physical Exam   amb bf nad harsh upper airway cough to point of gagging  Wt Readings from Last 3 Encounters:  12/06/15 162 lb (73.483 kg)  11/11/15 165 lb 4 oz (74.957 kg)  09/25/15 163 lb 2 oz (73.993 kg)    Vital signs reviewed  HEENT: nl dentition, turbinates, and oropharynx. Nl external ear canals without cough reflex   NECK :  without JVD/Nodes/TM/ nl carotid upstrokes bilaterally   LUNGS: no acc muscle use,  Nl contour chest which is clear to A and P bilaterally without cough on insp or exp maneuvers   CV:  RRR  no s3 or murmur or increase in P2, no edema   ABD:  soft and nontender with nl inspiratory excursion in the supine position. No bruits or organomegaly, bowel sounds nl  MS:  Nl gait/ ext warm without deformities, calf tenderness, cyanosis or clubbing No obvious joint restrictions   SKIN: warm and dry without lesions    NEURO:  alert, approp, nl sensorium with  no motor deficits      CXR PA and Lateral:   12/06/2015 :    I personally reviewed images and agree with radiology impression as follows:    No active cardiopulmonary disease.      Assessment & Plan:

## 2015-12-07 ENCOUNTER — Encounter: Payer: Self-pay | Admitting: Internal Medicine

## 2015-12-07 NOTE — Assessment & Plan Note (Signed)
Complicated by HBP    Body mass index is 32.7   Lab Results  Component Value Date   TSH 0.82 11/23/2011     Contributing to gerd tendency/ doe/reviewed the need and the process to achieve and maintain neg calorie balance > defer f/u primary care including intermittently monitoring thyroid status

## 2015-12-07 NOTE — Assessment & Plan Note (Signed)
As not yettaken her morning medicines.Since she is on clonidine I emphasized that she should take her medicines on a perfectly regular basis> Follow up per Primary Care planned

## 2015-12-10 ENCOUNTER — Telehealth: Payer: Self-pay | Admitting: Internal Medicine

## 2015-12-10 NOTE — Progress Notes (Signed)
Quick Note:  lmtcb ______ 

## 2015-12-10 NOTE — Telephone Encounter (Signed)
Result Notes     Notes Recorded by Rosana Berger, CMA on 12/10/2015 at 10:31 AM lmtcb ------  Notes Recorded by Tanda Rockers, MD on 12/06/2015 at 5:13 PM Call pt: Reviewed cxr and no acute change so no change in recommendations made at [facility] with pt. She is aware of results. Nothing further was needed.

## 2015-12-15 DIAGNOSIS — I639 Cerebral infarction, unspecified: Secondary | ICD-10-CM

## 2015-12-15 HISTORY — DX: Cerebral infarction, unspecified: I63.9

## 2016-01-22 ENCOUNTER — Ambulatory Visit (INDEPENDENT_AMBULATORY_CARE_PROVIDER_SITE_OTHER): Payer: Managed Care, Other (non HMO) | Admitting: Internal Medicine

## 2016-01-22 ENCOUNTER — Encounter: Payer: Self-pay | Admitting: Internal Medicine

## 2016-01-22 ENCOUNTER — Telehealth: Payer: Self-pay | Admitting: Internal Medicine

## 2016-01-22 VITALS — BP 132/90 | HR 83 | Temp 98.8°F | Ht 59.0 in | Wt 165.1 lb

## 2016-01-22 DIAGNOSIS — R05 Cough: Secondary | ICD-10-CM | POA: Diagnosis not present

## 2016-01-22 DIAGNOSIS — R51 Headache: Secondary | ICD-10-CM

## 2016-01-22 DIAGNOSIS — R058 Other specified cough: Secondary | ICD-10-CM

## 2016-01-22 DIAGNOSIS — I10 Essential (primary) hypertension: Secondary | ICD-10-CM | POA: Diagnosis not present

## 2016-01-22 DIAGNOSIS — R519 Headache, unspecified: Secondary | ICD-10-CM

## 2016-01-22 LAB — CBC WITH DIFFERENTIAL/PLATELET
Basophils Absolute: 0 10*3/uL (ref 0.0–0.1)
Basophils Relative: 0.3 % (ref 0.0–3.0)
Eosinophils Absolute: 0.1 10*3/uL (ref 0.0–0.7)
Eosinophils Relative: 1 % (ref 0.0–5.0)
HCT: 41.9 % (ref 36.0–46.0)
Hemoglobin: 13.6 g/dL (ref 12.0–15.0)
Lymphocytes Relative: 35.8 % (ref 12.0–46.0)
Lymphs Abs: 1.8 10*3/uL (ref 0.7–4.0)
MCHC: 32.5 g/dL (ref 30.0–36.0)
MCV: 90.3 fl (ref 78.0–100.0)
Monocytes Absolute: 0.4 10*3/uL (ref 0.1–1.0)
Monocytes Relative: 8.3 % (ref 3.0–12.0)
Neutro Abs: 2.8 10*3/uL (ref 1.4–7.7)
Neutrophils Relative %: 54.6 % (ref 43.0–77.0)
Platelets: 212 10*3/uL (ref 150.0–400.0)
RBC: 4.65 Mil/uL (ref 3.87–5.11)
RDW: 14.1 % (ref 11.5–15.5)
WBC: 5.1 10*3/uL (ref 4.0–10.5)

## 2016-01-22 LAB — BASIC METABOLIC PANEL
BUN: 10 mg/dL (ref 6–23)
CO2: 28 mEq/L (ref 19–32)
Calcium: 9.7 mg/dL (ref 8.4–10.5)
Chloride: 102 mEq/L (ref 96–112)
Creatinine, Ser: 0.78 mg/dL (ref 0.40–1.20)
GFR: 104.05 mL/min (ref >60.00)
Glucose, Bld: 106 mg/dL — ABNORMAL HIGH (ref 70–99)
Potassium: 3.6 mEq/L (ref 3.5–5.1)
Sodium: 136 mEq/L (ref 135–145)

## 2016-01-22 LAB — SEDIMENTATION RATE: Sed Rate: 16 mm/hr (ref 0–22)

## 2016-01-22 MED ORDER — CYCLOBENZAPRINE HCL 10 MG PO TABS: 10.0000 mg | ORAL_TABLET | Freq: Three times a day (TID) | ORAL | Status: DC | PRN

## 2016-01-22 MED ORDER — FUROSEMIDE 20 MG PO TABS: 20.0000 mg | ORAL_TABLET | Freq: Every day | ORAL | Status: DC

## 2016-01-22 MED ORDER — PREDNISONE 10 MG PO TABS: ORAL_TABLET | ORAL | Status: DC

## 2016-01-22 NOTE — Telephone Encounter (Signed)
LMOM informing Pt to d/c Maxzide and Clonidine, and start Lasix 20 mg 1 tablet daily, and continue Plendil, Bystolic, Losartan HCTZ and monitor BPs. Informed her that Lasix was sent to [facility]. Pt has CPE on 02/26/2016 at 3 PM.

## 2016-01-22 NOTE — Patient Instructions (Signed)
GO TO THE LAB : Get the blood work    GO TO THE FRONT DESK Schedule a routine office visit or check up to be done in  3 months  Please be fasting    Call back as soon as possible to let us know if you're taking losartan or losartan HCT . ?dose.  For the headache: Rest, plenty of fluids Flexeril a muscle relaxant 3 times a day as needed. Will cause drowsiness Prednisone as prescribed Tylenol  500 mg OTC 2 tabs a day every 8 hours as needed for pain Call if not gradually improving, call or go to the ER if severe headaches, neck stiffness or a rash

## 2016-01-22 NOTE — Progress Notes (Signed)
Subjective:    Patient ID: Abigail Campos, female    DOB: 27-Jan-1971, 45 y.o.   MRN: [id number]  DOS:  01/22/2016 Type of visit - description : Acute visit Interval history: 3 days history of headache, rated at the temples bilaterally, top of the head and nuchal area. Slightly different to her migraines because she is not having nausea or visual disturbances. Not worse HA of her life. HA is persistent throughout the day but she is able to sleep okay. Pain is scale is at most 5/10. Also was seen with cough, pulmonary note  reviewed, doing better. HTN: She reports she is taking losartan although she was recommended to discontinue it. Apparently not taking clonidine. BP today is slightly elevated.   Review of Systems  Denies fever chills No sinus pain, congestion and discharge Neck does not feel stiff. No diplopia, dizziness, slurred speech or motor deficits  Past Medical History  Diagnosis Date  . Headache(784.0)     developed migraines after 2nd pregnancy, had a negative CT of the head  . Hypertension     Past Surgical History  Procedure Laterality Date  . Inguinal hernia repair      2004  . Cesarean section      [id number]  . Abdominal hysterectomy  11-14-07    no oophorectomy per surgical report    Social History   Social History  . Marital Status: Married    Spouse Name: N/A  . Number of Children: 2  . Years of Education: N/A   Occupational History  . TEACHER    Social History Main Topics  . Smoking status: Never Smoker   . Smokeless tobacco: Never Used  . Alcohol Use: No  . Drug Use: No  . Sexual Activity: Not on file   Other Topics Concern  . Not on file   Social History Narrative        Medication List       This list is accurate as of: 01/22/16  4:53 PM.  Always use your most recent med list.               cyclobenzaprine 10 MG tablet  Commonly known as:  FLEXERIL  Take 1 tablet (10 mg total) by mouth 3 (three) times daily as needed for  muscle spasms.     famotidine 20 MG tablet  Commonly known as:  PEPCID  One at bedtime     felodipine 10 MG 24 hr tablet  Commonly known as:  PLENDIL  Take 1 tablet (10 mg total) by mouth daily.     furosemide 20 MG tablet  Commonly known as:  LASIX  Take 1 tablet (20 mg total) by mouth daily.     multivitamin tablet  Take 1 tablet by mouth 2 (two) times daily.     Nebivolol HCl 20 MG Tabs  Take 1 tablet (20 mg total) by mouth 2 (two) times daily.     pantoprazole 40 MG tablet  Commonly known as:  PROTONIX  Take 1 tablet (40 mg total) by mouth daily. Take 30-60 min before first meal of the day     predniSONE 10 MG tablet  Commonly known as:  DELTASONE  4 tablets x 2 days, 3 tabs x 2 days, 2 tabs x 2 days, 1 tab x 2 days     VITAMIN D PO  Take by mouth.           Objective:   Physical Exam BP 132/90 mmHg  Pulse 83  Temp(Src) 98.8 F (37.1 C) (Oral)  Ht 4\' 11"  (1.499 m)  Wt 165 lb 2 oz (74.9 kg)  BMI 33.33 kg/m2  SpO2 97% General:   Well developed, well nourished . NAD.  HEENT:  Normocephalic . Face symmetric, atraumatic Lungs:  CTA B Normal respiratory effort, no intercostal retractions, no accessory muscle use. Heart: RRR,  no murmur.  No pretibial edema bilaterally  Skin: Not pale. Not jaundice Neurologic:  alert & oriented X3.  Speech normal, gait appropriate for age and unassisted. EOMI, pupils equal and reactive, neck full ROM, DTRs and motor symmetric. Psych--  Cognition and judgment appear intact.  Cooperative with normal attention span and concentration.  Behavior appropriate. No anxious or depressed appearing.      Assessment & Plan:  Assessment > HTN Headaches , migraines dx after 2nd pregnancy, (-) CT head 2014 and 2015 Insomnia  Cough, persisting, saw Dr. Melvyn Novas 11-2015  PLAN: Headache: Slightly different from migraines (tensional?) neuro exam normal. Recommend conservative treatment with rest, fluids, Flexeril, Tylenol and  prednisone. Call if not better. Check a CBC and sedimentation rate HTN: Taking bystolic 20 bid, plendil, maxzide and losaratn (was supposed to stop it); clonidine is on her list but not taking it. Advise patient to call back ASAP >> let us  know if she takes indeed losartan or losartan HCT , dose?; check a BMP Addendum: He has a taken losartan HCT 100-25. Plan: Discontinue Maxide (already on HCTZ)  and clonidine, start Lasix 20 mg, continue Plendil, bystolic, losartan HCT, monitor BPs;   BMP in one month Cough -- improved, saw pulmonary  RTC 3 months

## 2016-01-22 NOTE — Progress Notes (Signed)
Pre visit review using our clinic review tool, if applicable. No additional management support is needed unless otherwise documented below in the visit note. 

## 2016-01-22 NOTE — Telephone Encounter (Signed)
FYI

## 2016-01-22 NOTE — Telephone Encounter (Signed)
Advise patient:  Discontinue Maxide and clonidine start Lasix 20 mg one tablet daily, prescription sent. continue Plendil, bystolic, losartan HCT,  monitor BPs Arrange for a  BMP in one month

## 2016-01-22 NOTE — Telephone Encounter (Signed)
Pt called back in to give the name of a medication per a previous request/ conversation. - Lasartan -HTZ 100/25 mg .

## 2016-02-25 ENCOUNTER — Telehealth: Payer: Self-pay | Admitting: Behavioral Health

## 2016-02-25 NOTE — Telephone Encounter (Signed)
Unable to reach patient at time of Pre-Visit Call.  Left message for patient to return call when available.    

## 2016-02-26 ENCOUNTER — Encounter: Payer: Managed Care, Other (non HMO) | Admitting: Internal Medicine

## 2016-03-01 ENCOUNTER — Telehealth: Payer: Self-pay | Admitting: Internal Medicine

## 2016-03-01 NOTE — Telephone Encounter (Signed)
Advise patient: Needs a BMP DX hypertension. Please arrange  also, let me know how her blood pressure is doing.

## 2016-03-02 NOTE — Telephone Encounter (Signed)
LMOM informing Pt to return call.  

## 2016-03-05 NOTE — Telephone Encounter (Signed)
Letter printed and mailed to Pt, instructing Pt she is due for BMP, and to let us know how her BP's have been doing since her last appt.

## 2016-04-01 ENCOUNTER — Inpatient Hospital Stay (HOSPITAL_COMMUNITY)
Admission: EM | Admit: 2016-04-01 | Discharge: 2016-04-08 | DRG: 064 | Disposition: A | Discharge status: Rehabilitation Facility | Payer: Managed Care, Other (non HMO) | Source: Intra-hospital | Attending: Neurology | Admitting: Neurology

## 2016-04-01 ENCOUNTER — Encounter (HOSPITAL_COMMUNITY): Payer: Self-pay

## 2016-04-01 ENCOUNTER — Emergency Department (HOSPITAL_COMMUNITY): Payer: Managed Care, Other (non HMO)

## 2016-04-01 DIAGNOSIS — E785 Hyperlipidemia, unspecified: Secondary | ICD-10-CM | POA: Diagnosis present

## 2016-04-01 DIAGNOSIS — D72829 Elevated white blood cell count, unspecified: Secondary | ICD-10-CM | POA: Diagnosis not present

## 2016-04-01 DIAGNOSIS — I69322 Dysarthria following cerebral infarction: Secondary | ICD-10-CM | POA: Diagnosis not present

## 2016-04-01 DIAGNOSIS — R402362 Coma scale, best motor response, obeys commands, at arrival to emergency department: Secondary | ICD-10-CM | POA: Diagnosis present

## 2016-04-01 DIAGNOSIS — Z823 Family history of stroke: Secondary | ICD-10-CM | POA: Diagnosis not present

## 2016-04-01 DIAGNOSIS — R7303 Prediabetes: Secondary | ICD-10-CM | POA: Diagnosis present

## 2016-04-01 DIAGNOSIS — I69391 Dysphagia following cerebral infarction: Secondary | ICD-10-CM | POA: Diagnosis not present

## 2016-04-01 DIAGNOSIS — R402132 Coma scale, eyes open, to sound, at arrival to emergency department: Secondary | ICD-10-CM | POA: Diagnosis present

## 2016-04-01 DIAGNOSIS — G9340 Encephalopathy, unspecified: Secondary | ICD-10-CM | POA: Diagnosis present

## 2016-04-01 DIAGNOSIS — I610 Nontraumatic intracerebral hemorrhage in hemisphere, subcortical: Secondary | ICD-10-CM | POA: Diagnosis not present

## 2016-04-01 DIAGNOSIS — R1311 Dysphagia, oral phase: Secondary | ICD-10-CM | POA: Diagnosis present

## 2016-04-01 DIAGNOSIS — E876 Hypokalemia: Secondary | ICD-10-CM | POA: Diagnosis present

## 2016-04-01 DIAGNOSIS — E669 Obesity, unspecified: Secondary | ICD-10-CM | POA: Diagnosis present

## 2016-04-01 DIAGNOSIS — Z886 Allergy status to analgesic agent status: Secondary | ICD-10-CM | POA: Diagnosis not present

## 2016-04-01 DIAGNOSIS — I6789 Other cerebrovascular disease: Secondary | ICD-10-CM | POA: Diagnosis not present

## 2016-04-01 DIAGNOSIS — I69359 Hemiplegia and hemiparesis following cerebral infarction affecting unspecified side: Secondary | ICD-10-CM | POA: Diagnosis not present

## 2016-04-01 DIAGNOSIS — N179 Acute kidney failure, unspecified: Secondary | ICD-10-CM | POA: Diagnosis not present

## 2016-04-01 DIAGNOSIS — G43909 Migraine, unspecified, not intractable, without status migrainosus: Secondary | ICD-10-CM | POA: Diagnosis present

## 2016-04-01 DIAGNOSIS — R531 Weakness: Secondary | ICD-10-CM | POA: Diagnosis present

## 2016-04-01 DIAGNOSIS — I69052 Hemiplegia and hemiparesis following nontraumatic subarachnoid hemorrhage affecting left dominant side: Secondary | ICD-10-CM | POA: Diagnosis not present

## 2016-04-01 DIAGNOSIS — G8194 Hemiplegia, unspecified affecting left nondominant side: Secondary | ICD-10-CM | POA: Diagnosis present

## 2016-04-01 DIAGNOSIS — Z91010 Allergy to peanuts: Secondary | ICD-10-CM | POA: Diagnosis not present

## 2016-04-01 DIAGNOSIS — Z8249 Family history of ischemic heart disease and other diseases of the circulatory system: Secondary | ICD-10-CM | POA: Diagnosis not present

## 2016-04-01 DIAGNOSIS — Z888 Allergy status to other drugs, medicaments and biological substances status: Secondary | ICD-10-CM

## 2016-04-01 DIAGNOSIS — I69320 Aphasia following cerebral infarction: Secondary | ICD-10-CM | POA: Diagnosis present

## 2016-04-01 DIAGNOSIS — Z4659 Encounter for fitting and adjustment of other gastrointestinal appliance and device: Secondary | ICD-10-CM

## 2016-04-01 DIAGNOSIS — Z6835 Body mass index (BMI) 35.0-35.9, adult: Secondary | ICD-10-CM

## 2016-04-01 DIAGNOSIS — M6249 Contracture of muscle, multiple sites: Secondary | ICD-10-CM | POA: Diagnosis not present

## 2016-04-01 DIAGNOSIS — R4701 Aphasia: Secondary | ICD-10-CM | POA: Diagnosis present

## 2016-04-01 DIAGNOSIS — Z885 Allergy status to narcotic agent status: Secondary | ICD-10-CM | POA: Diagnosis not present

## 2016-04-01 DIAGNOSIS — Z91040 Latex allergy status: Secondary | ICD-10-CM | POA: Diagnosis not present

## 2016-04-01 DIAGNOSIS — R29810 Facial weakness: Secondary | ICD-10-CM | POA: Diagnosis present

## 2016-04-01 DIAGNOSIS — R1319 Other dysphagia: Secondary | ICD-10-CM | POA: Diagnosis present

## 2016-04-01 DIAGNOSIS — I10 Essential (primary) hypertension: Secondary | ICD-10-CM | POA: Diagnosis present

## 2016-04-01 DIAGNOSIS — I619 Nontraumatic intracerebral hemorrhage, unspecified: Secondary | ICD-10-CM

## 2016-04-01 DIAGNOSIS — R51 Headache: Secondary | ICD-10-CM | POA: Diagnosis not present

## 2016-04-01 DIAGNOSIS — R402232 Coma scale, best verbal response, inappropriate words, at arrival to emergency department: Secondary | ICD-10-CM | POA: Diagnosis present

## 2016-04-01 DIAGNOSIS — R29731 NIHSS score 31: Secondary | ICD-10-CM | POA: Diagnosis present

## 2016-04-01 DIAGNOSIS — R471 Dysarthria and anarthria: Secondary | ICD-10-CM | POA: Diagnosis present

## 2016-04-01 DIAGNOSIS — Z91038 Other insect allergy status: Secondary | ICD-10-CM | POA: Diagnosis not present

## 2016-04-01 DIAGNOSIS — R414 Neurologic neglect syndrome: Secondary | ICD-10-CM | POA: Diagnosis present

## 2016-04-01 DIAGNOSIS — R001 Bradycardia, unspecified: Secondary | ICD-10-CM | POA: Diagnosis not present

## 2016-04-01 DIAGNOSIS — I161 Hypertensive emergency: Secondary | ICD-10-CM | POA: Diagnosis present

## 2016-04-01 DIAGNOSIS — G936 Cerebral edema: Secondary | ICD-10-CM | POA: Diagnosis not present

## 2016-04-01 DIAGNOSIS — Z09 Encounter for follow-up examination after completed treatment for conditions other than malignant neoplasm: Secondary | ICD-10-CM

## 2016-04-01 DIAGNOSIS — I69991 Dysphagia following unspecified cerebrovascular disease: Secondary | ICD-10-CM | POA: Diagnosis not present

## 2016-04-01 DIAGNOSIS — I69920 Aphasia following unspecified cerebrovascular disease: Secondary | ICD-10-CM | POA: Diagnosis not present

## 2016-04-01 DIAGNOSIS — G43109 Migraine with aura, not intractable, without status migrainosus: Secondary | ICD-10-CM | POA: Diagnosis not present

## 2016-04-01 LAB — PROTIME-INR
INR: 1.02 (ref 0.00–1.49)
Prothrombin Time: 13.6 seconds (ref 11.6–15.2)

## 2016-04-01 LAB — COMPREHENSIVE METABOLIC PANEL
ALT: 24 U/L (ref 14–54)
AST: 26 U/L (ref 15–41)
Albumin: 4 g/dL (ref 3.5–5.0)
Alkaline Phosphatase: 69 U/L (ref 38–126)
Anion gap: 13 (ref 5–15)
BUN: 9 mg/dL (ref 6–20)
CO2: 22 mmol/L (ref 22–32)
Calcium: 9.5 mg/dL (ref 8.9–10.3)
Chloride: 101 mmol/L (ref 101–111)
Creatinine, Ser: 0.89 mg/dL (ref 0.44–1.00)
GFR calc Af Amer: >60 mL/min (ref >60)
GFR calc non Af Amer: >60 mL/min (ref >60)
Glucose, Bld: 133 mg/dL — ABNORMAL HIGH (ref 65–99)
Potassium: 3 mmol/L — ABNORMAL LOW (ref 3.5–5.1)
Sodium: 136 mmol/L (ref 135–145)
Total Bilirubin: 0.5 mg/dL (ref 0.3–1.2)
Total Protein: 8.2 g/dL — ABNORMAL HIGH (ref 6.5–8.1)

## 2016-04-01 LAB — URINALYSIS, ROUTINE W REFLEX MICROSCOPIC
Bilirubin Urine: NEGATIVE
Glucose, UA: NEGATIVE mg/dL
Hgb urine dipstick: NEGATIVE
Ketones, ur: NEGATIVE mg/dL
Leukocytes, UA: NEGATIVE
Nitrite: NEGATIVE
Protein, ur: NEGATIVE mg/dL
Specific Gravity, Urine: 1.01 (ref 1.005–1.030)
pH: 8 (ref 5.0–8.0)

## 2016-04-01 LAB — I-STAT CHEM 8, ED
BUN: 10 mg/dL (ref 6–20)
Calcium, Ion: 1.12 mmol/L (ref 1.12–1.23)
Chloride: 99 mmol/L — ABNORMAL LOW (ref 101–111)
Creatinine, Ser: 0.8 mg/dL (ref 0.44–1.00)
Glucose, Bld: 135 mg/dL — ABNORMAL HIGH (ref 65–99)
HCT: 47 % — ABNORMAL HIGH (ref 36.0–46.0)
Hemoglobin: 16 g/dL — ABNORMAL HIGH (ref 12.0–15.0)
Potassium: 3 mmol/L — ABNORMAL LOW (ref 3.5–5.1)
Sodium: 139 mmol/L (ref 135–145)
TCO2: 26 mmol/L (ref 0–100)

## 2016-04-01 LAB — CBG MONITORING, ED: Glucose-Capillary: 128 mg/dL — ABNORMAL HIGH (ref 65–99)

## 2016-04-01 LAB — DIFFERENTIAL
Basophils Absolute: 0 10*3/uL (ref 0.0–0.1)
Basophils Relative: 0 %
Eosinophils Absolute: 0.1 10*3/uL (ref 0.0–0.7)
Eosinophils Relative: 1 %
Lymphocytes Relative: 43 %
Lymphs Abs: 3.9 10*3/uL (ref 0.7–4.0)
Monocytes Absolute: 0.9 10*3/uL (ref 0.1–1.0)
Monocytes Relative: 10 %
Neutro Abs: 4.3 10*3/uL (ref 1.7–7.7)
Neutrophils Relative %: 46 %

## 2016-04-01 LAB — CBC
HCT: 41.6 % (ref 36.0–46.0)
Hemoglobin: 13.4 g/dL (ref 12.0–15.0)
MCH: 29.1 pg (ref 26.0–34.0)
MCHC: 32.2 g/dL (ref 30.0–36.0)
MCV: 90.2 fL (ref 78.0–100.0)
Platelets: 304 10*3/uL (ref 150–400)
RBC: 4.61 MIL/uL (ref 3.87–5.11)
RDW: 13.2 % (ref 11.5–15.5)
WBC: 9.3 10*3/uL (ref 4.0–10.5)

## 2016-04-01 LAB — RAPID URINE DRUG SCREEN, HOSP PERFORMED
Amphetamines: NOT DETECTED
Barbiturates: NOT DETECTED
Benzodiazepines: NOT DETECTED
Cocaine: NOT DETECTED
Opiates: NOT DETECTED
Tetrahydrocannabinol: NOT DETECTED

## 2016-04-01 LAB — MRSA PCR SCREENING: MRSA by PCR: NEGATIVE

## 2016-04-01 LAB — I-STAT TROPONIN, ED: Troponin i, poc: 0 ng/mL (ref 0.00–0.08)

## 2016-04-01 LAB — ETHANOL: Alcohol, Ethyl (B): <5 mg/dL (ref <5)

## 2016-04-01 LAB — APTT: aPTT: 27 seconds (ref 24–37)

## 2016-04-01 IMAGING — CT CT HEAD W/O CM
1 series · 15 of 30 positions shown, 19 images · non-contrast
Comparison: [DATE]

CLINICAL DATA: Headache and sudden loss of consciousness

EXAM:
CT HEAD WITHOUT CONTRAST
TECHNIQUE: Contiguous axial images were obtained from the base of the skull
through the vertex without intravenous contrast.

[Series 2: head 5.0 st · axial · 0.46mm/px · z∈[-193,-53]mm · 15 of 32 slices shown, 19 images]
[im 2/32  brain]
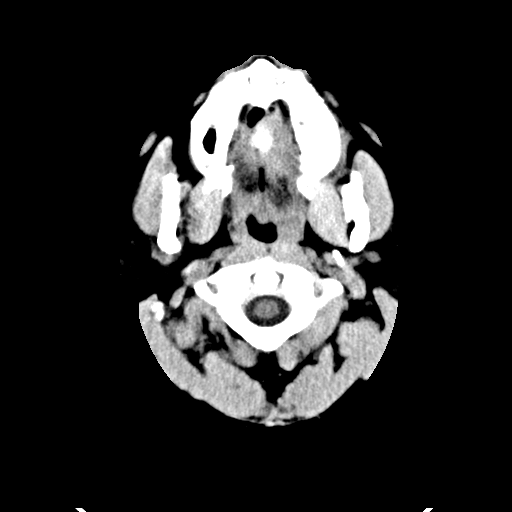
[im 2/32  bone]
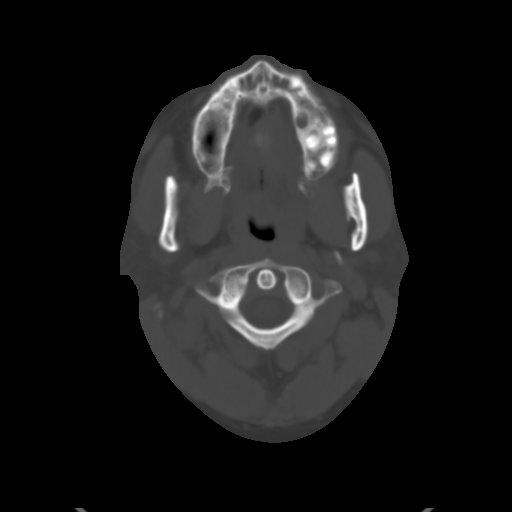
[im 4/32  brain]
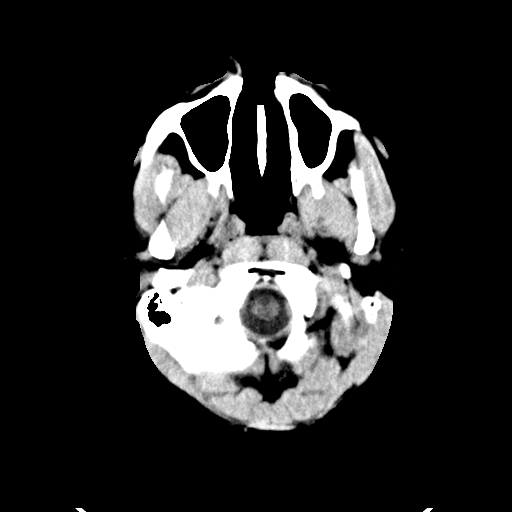
[im 6/32  brain]
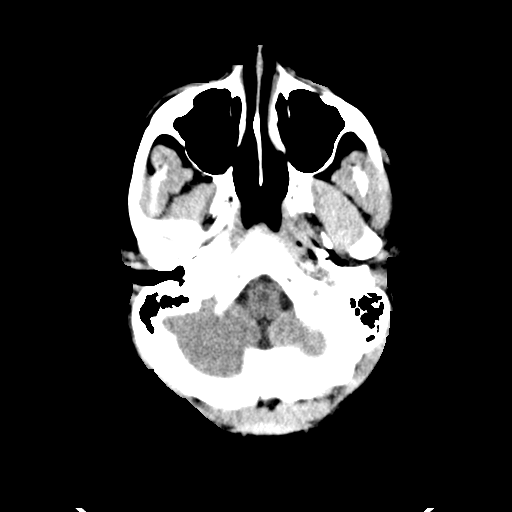
[im 8/32  brain]
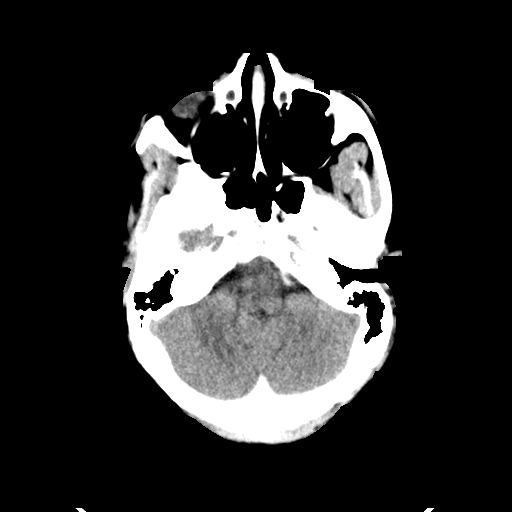
[im 10/32  brain]
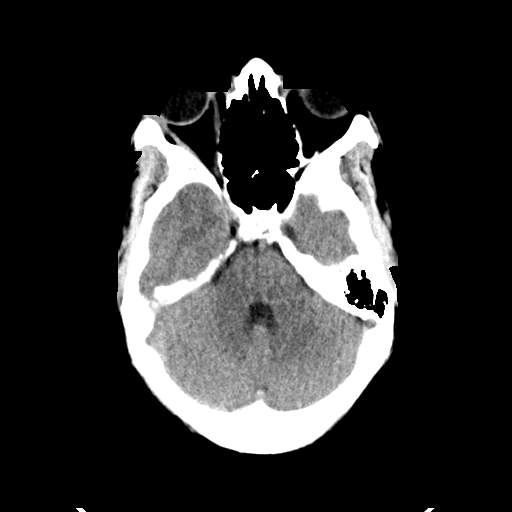
[im 10/32  bone]
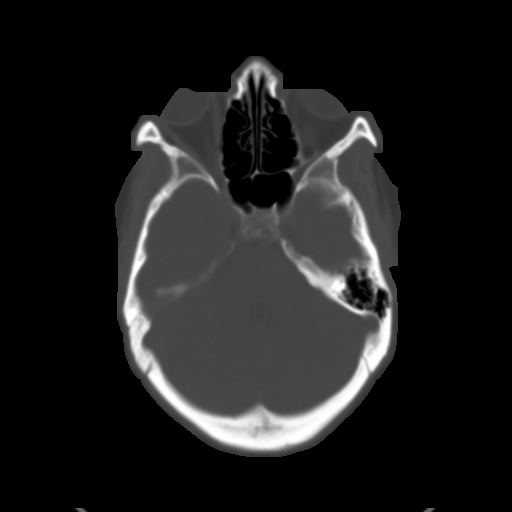
[im 12/32  brain]
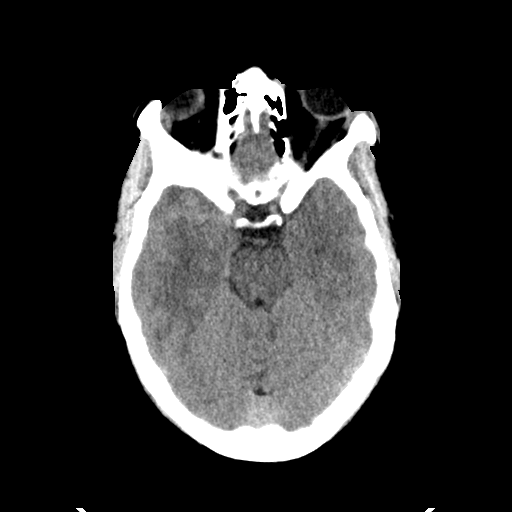
[im 14/32  brain]
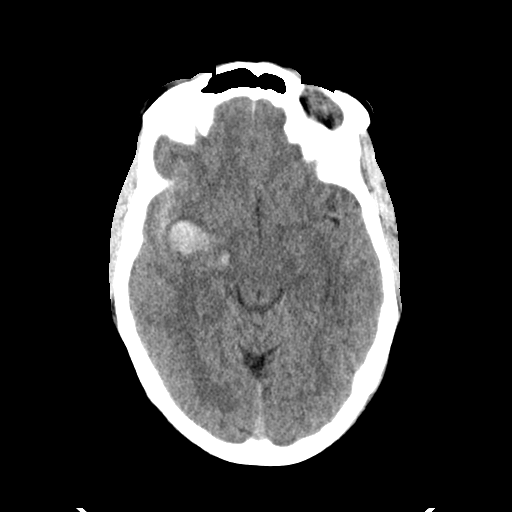
[im 17/32  brain]
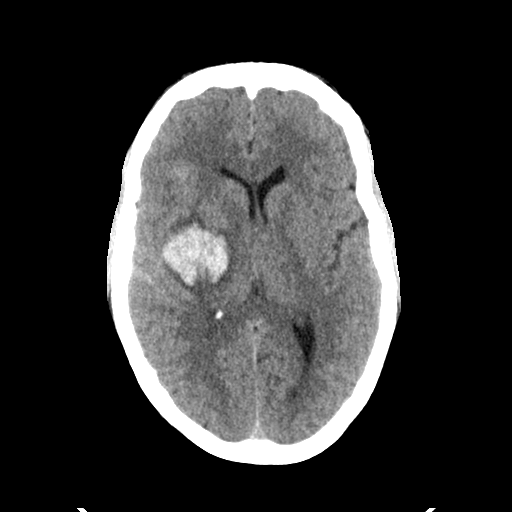
[im 18/32  brain]
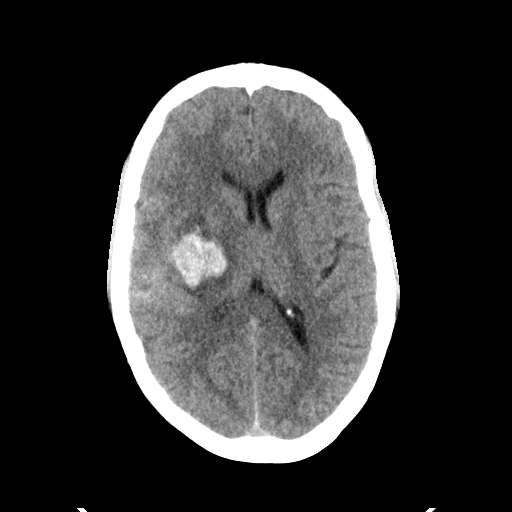
[im 18/32  bone]
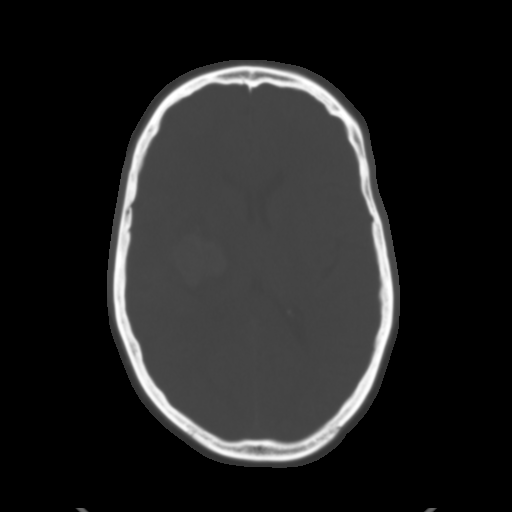
[im 20/32  brain]
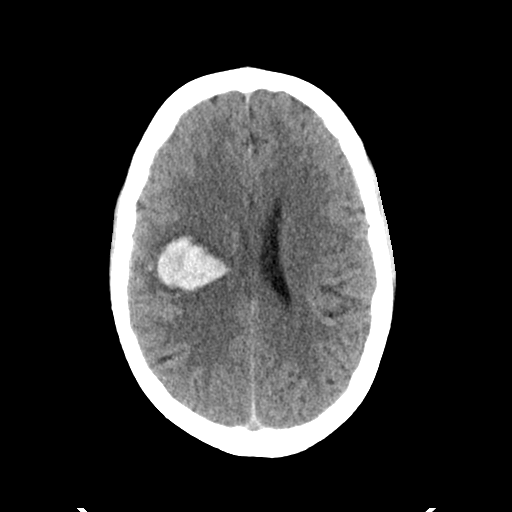
[im 22/32  brain]
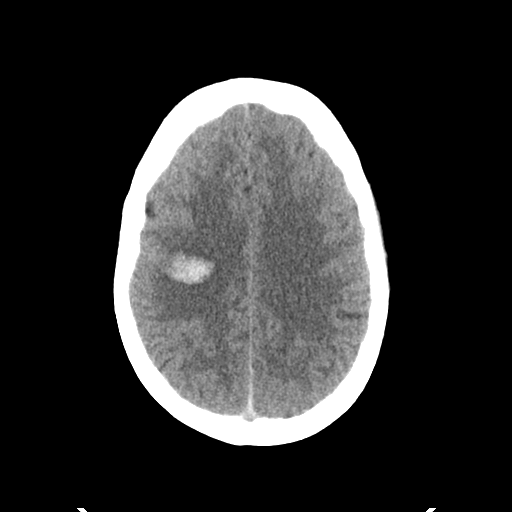
[im 24/32  brain]
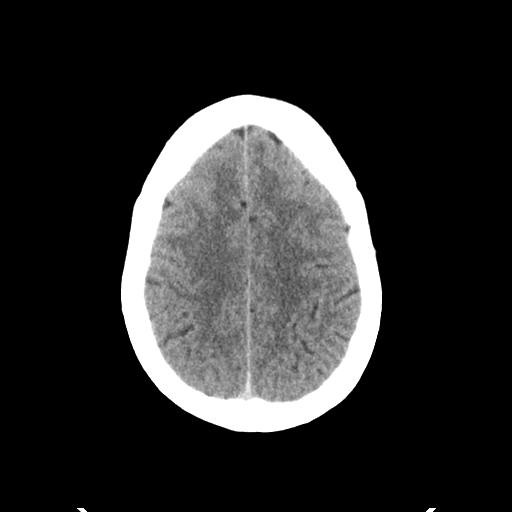
[im 26/32  brain]
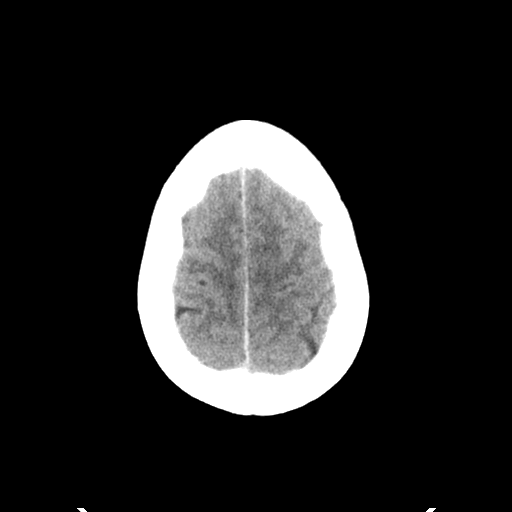
[im 26/32  bone]
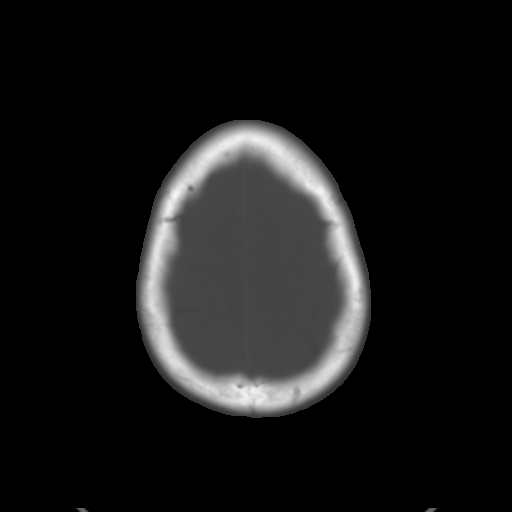
[im 28/32  brain]
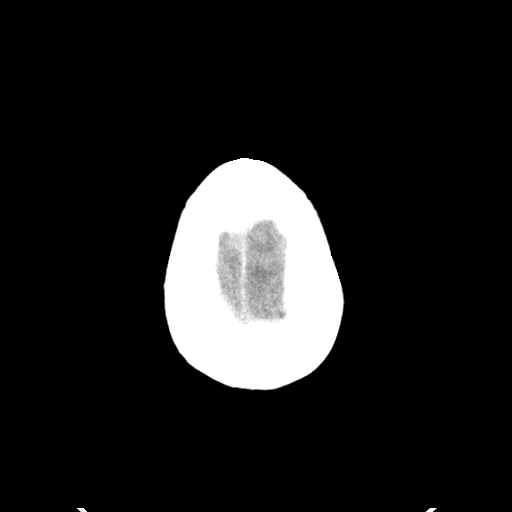
[im 30/32  brain]
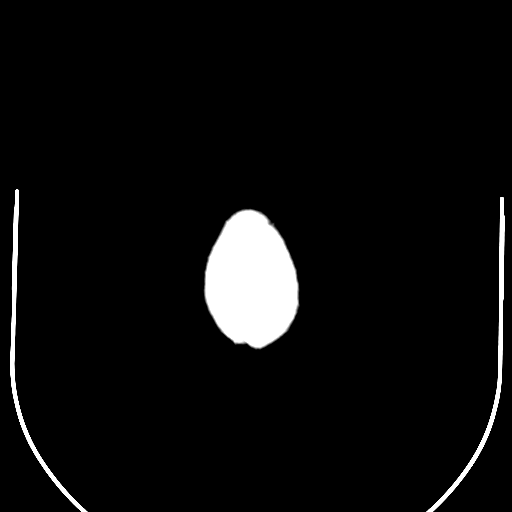

[15 of 30 positions shown; findings below may reference images not displayed]

FINDINGS: There is a focal area of hemorrhage arising in the posterior,
lateral right basal ganglia region measuring 4.5 x 3.4 x 3.4 cm with
mild surrounding edema. There is subarachnoid hemorrhage near this
intraparenchymal hemorrhage with blood filling the right sylvian
fissure. There is effacement of the right lateral ventricle with 7
mm of midline shift toward the left. There is no subdural or
epidural fluid. Elsewhere gray-white compartments appear normal.
There is no hydrocephalus. The bony calvarium appears intact. The
mastoid air cells are clear. There is mucosal thickening in both
maxillary antra. No intraorbital lesions are evident.
IMPRESSION: Intraparenchymal hemorrhage arising from the posterior, lateral
right basal ganglia with subarachnoid hemorrhage extending into the
sylvian fissure on the right. There is mass effect with mild midline
shift toward the left of approximately 7 mm. Elsewhere gray-white
compartments normal. Mild paranasal sinus disease in the maxillary
antra bilaterally.

Volume of hemorrhage in the right basal ganglia region is
approximately 26 cubic cm. Note that there is additional hemorrhage
in the subarachnoid space on the right which cannot be easily
quantified with respect to specific volume of hemorrhage.

Critical Value/emergent results were called by telephone at the time
of interpretation on [DATE] at [DATE] to Dr. DOMOND ,
who verbally acknowledged these results. This report was also
communicated as a critical value emergently to Dr. DOMOND,
neurology.

## 2016-04-01 MED ORDER — CLEVIDIPINE BUTYRATE 0.5 MG/ML IV EMUL
0.0000 mg/h | INTRAVENOUS | Status: DC
  Administered 2016-04-01 (×2): 20 mg/h via INTRAVENOUS
  Administered 2016-04-01: 1 mg/h via INTRAVENOUS
  Administered 2016-04-02: 3 mg/h via INTRAVENOUS
  Administered 2016-04-02: 16 mg/h via INTRAVENOUS
  Administered 2016-04-02: 15 mg/h via INTRAVENOUS
  Administered 2016-04-02: 18 mg/h via INTRAVENOUS
  Administered 2016-04-05 (×2): 4 mg/h via INTRAVENOUS
  Administered 2016-04-05: 1 mg/h via INTRAVENOUS
  Filled 2016-04-01 (×11): qty 50

## 2016-04-01 MED ORDER — NICARDIPINE HCL IN NACL 20-0.86 MG/200ML-% IV SOLN: 3.0000 mg/h | INTRAVENOUS | Status: DC

## 2016-04-01 MED ORDER — SODIUM CHLORIDE 0.9 % IV SOLN
INTRAVENOUS | Status: DC
  Administered 2016-04-01 – 2016-04-06 (×7): via INTRAVENOUS

## 2016-04-01 MED ORDER — LABETALOL HCL 5 MG/ML IV SOLN: 10.0000 mg | INTRAVENOUS | Status: DC | PRN

## 2016-04-01 MED ORDER — PANTOPRAZOLE SODIUM 40 MG IV SOLR
40.0000 mg | Freq: Every day | INTRAVENOUS | Status: DC
  Administered 2016-04-01 – 2016-04-05 (×5): 40 mg via INTRAVENOUS
  Filled 2016-04-01 (×5): qty 40

## 2016-04-01 MED ORDER — ACETAMINOPHEN 325 MG PO TABS
650.0000 mg | ORAL_TABLET | ORAL | Status: DC | PRN
  Administered 2016-04-03 – 2016-04-08 (×9): 650 mg via ORAL
  Filled 2016-04-01 (×9): qty 2

## 2016-04-01 MED ORDER — ACETAMINOPHEN 650 MG RE SUPP
650.0000 mg | RECTAL | Status: DC | PRN
Start: 2016-04-01 — End: 2016-04-08
  Administered 2016-04-05 – 2016-04-07 (×2): 650 mg via RECTAL
  Filled 2016-04-01 (×2): qty 1

## 2016-04-01 MED ORDER — SENNOSIDES-DOCUSATE SODIUM 8.6-50 MG PO TABS
1.0000 | ORAL_TABLET | Freq: Two times a day (BID) | ORAL | Status: DC
  Administered 2016-04-03 – 2016-04-08 (×8): 1 via ORAL
  Filled 2016-04-01 (×8): qty 1

## 2016-04-01 MED ORDER — CLEVIDIPINE BUTYRATE 0.5 MG/ML IV EMUL
INTRAVENOUS | Status: AC
  Administered 2016-04-01: 21 mg
  Filled 2016-04-01: qty 50

## 2016-04-01 MED ORDER — POTASSIUM CHLORIDE 10 MEQ/100ML IV SOLN
10.0000 meq | INTRAVENOUS | Status: AC
  Administered 2016-04-01 (×4): 10 meq via INTRAVENOUS
  Filled 2016-04-01 (×3): qty 100

## 2016-04-01 MED ORDER — HYDRALAZINE HCL 20 MG/ML IJ SOLN
10.0000 mg | INTRAMUSCULAR | Status: DC | PRN
  Administered 2016-04-03 – 2016-04-04 (×3): 20 mg via INTRAVENOUS
  Administered 2016-04-04: 10 mg via INTRAVENOUS
  Administered 2016-04-05 – 2016-04-06 (×3): 20 mg via INTRAVENOUS
  Administered 2016-04-07 – 2016-04-08 (×3): 10 mg via INTRAVENOUS
  Filled 2016-04-01 (×9): qty 1

## 2016-04-01 MED ORDER — NICARDIPINE HCL IN NACL 20-0.86 MG/200ML-% IV SOLN
3.0000 mg/h | INTRAVENOUS | Status: DC
  Administered 2016-04-01: 5 mg/h via INTRAVENOUS
  Filled 2016-04-01: qty 200

## 2016-04-01 MED ORDER — STROKE: EARLY STAGES OF RECOVERY BOOK
Freq: Once | Status: AC
  Administered 2016-04-01: 23:00:00
  Filled 2016-04-01: qty 1

## 2016-04-01 MED ORDER — LABETALOL HCL 5 MG/ML IV SOLN
20.0000 mg | Freq: Once | INTRAVENOUS | Status: AC
  Administered 2016-04-01: 10 mg via INTRAVENOUS
  Filled 2016-04-01: qty 4

## 2016-04-01 MED ORDER — FAMOTIDINE IN NACL 20-0.9 MG/50ML-% IV SOLN
20.0000 mg | Freq: Two times a day (BID) | INTRAVENOUS | Status: DC
  Administered 2016-04-01 – 2016-04-06 (×10): 20 mg via INTRAVENOUS
  Filled 2016-04-01 (×10): qty 50

## 2016-04-01 MED ORDER — LABETALOL HCL 5 MG/ML IV SOLN
10.0000 mg | INTRAVENOUS | Status: DC | PRN
  Administered 2016-04-03: 10 mg via INTRAVENOUS
  Filled 2016-04-01: qty 4

## 2016-04-01 NOTE — ED Notes (Signed)
Family at bedside. Plan of care discussed and waiting on bed

## 2016-04-01 NOTE — Code Documentation (Signed)
Patient is a 45 year old BW presenting to [facility] ED via [facility] with left sided weakness and headache.  Patient was at work since 9 am this morning with the complaints of a headache.  The patient at [place] 1500 reported not feeling well and developed left sided weakness and slurred speech.  [facility] patient CBG was 116 with a BP 189/98. Patient upon arrival to [facility] ED has a NIH of 31.  Patient transferred to CT. Patient + for bleed, neuro team at bedside and neuro surgery and CCM consulted. HOB up, and cardene initiatied for bp control and zofran given for large amount of emesis. ED MD at bedsided.Handoff to [employer]

## 2016-04-01 NOTE — Procedures (Signed)
Arterial Catheter Insertion Procedure Note Abigail Campos [id number] 1970-12-31  Procedure: Insertion of Arterial Catheter  Indications: Blood pressure monitoring and Frequent blood sampling  Procedure Details Consent: Unable to obtain consent because of altered level of consciousness. Time Out: Verified patient identification, verified procedure, site/side was marked, verified correct patient position, special equipment/implants available, medications/allergies/relevent history reviewed, required imaging and test results available.  Performed  Maximum sterile technique was used including cap, gloves, gown, hand hygiene, mask and sheet. Skin prep: Chlorhexidine; local anesthetic administered 20 gauge catheter was inserted into right radial artery using the Seldinger technique.  Evaluation Blood flow good; BP tracing good. Complications: No apparent complications.  Assisted by Abigail Campos, RRT.   Abigail Campos 04/01/2016

## 2016-04-01 NOTE — ED Notes (Signed)
Patient arrived by EMS due to altered loc while at work. Patient per co-workers complained of headache all day and then went into office saying she didn't feel well and collapsed in chair. On arrival right gaze with left sided facial droop and no initial movement. Aphasic on arrival. Immediately to CT. Once arrived back to room began vomiting, head elevated and mouth suctioned. Moved to trauma room for airway management. Neurology at bedside with EDP and no intubation at present.

## 2016-04-01 NOTE — ED Notes (Signed)
Inserted temp foley catheter with assistance from Seward Grater, Therapist, sports.

## 2016-04-01 NOTE — Progress Notes (Addendum)
eLink Physician-Brief Progress Note Patient Name: Abigail Campos DOB: 21-Sep-1971 MRN: [id number]   Date of Service  04/01/2016  HPI/Events of Note  Hypertension - BP = 177/97. Currently on Cleviprex IV infusion and Hydralazine IV PRN  eICU Interventions  Will order Labetalol 10 mg IV Q 2 hours PRN SBP > 160. Hold for HR < 60.     Intervention Category Major Interventions: Hypertension - evaluation and management  Kadisha Goodine Eugene 04/01/2016, 6:10 PM

## 2016-04-01 NOTE — ED Provider Notes (Signed)
[name]: [id number]     Arrival date & time 04/01/16  43 History   First MD Initiated Contact with Patient 04/01/16 1543     Chief Complaint  Patient presents with  . code stroke      (Consider location/radiation/quality/duration/timing/severity/associated sxs/prior Treatment) HPI Patient presents as a code stroke. Patient has a history of hypertension, as well as migraine headaches. She was at work today and was complaining of headache to her coworkers. She suddenly developed weakness of her left arm, and they report her face became "twisted". EMS was called and found the patient confused, with a rightward gaze preference, neglecting her left side. She presents here, nonverbal, with GCS of 9-10, and is unable to provide any further history.  Level V caveat acuity of condition. Past Medical History  Diagnosis Date  . Headache(784.0)     developed migraines after 2nd pregnancy, had a negative CT of the head  . Hypertension   . Essential hypertension 10/19/2007    Mar 29, 2010: metoprolol changed to bystolic (was not feeling well on it, no specific allergy or reaction)     Past Surgical History  Procedure Laterality Date  . Inguinal hernia repair      03-30-03  . Cesarean section      [id number]  . Abdominal hysterectomy  11-14-07    no oophorectomy per surgical report   Family History  Problem Relation Age of Onset  . Diabetes Mother   . Hypertension Mother   . Glaucoma Mother   . Heart disease      2 uncles died 2011-03-30 from CAD-CHF  . Stroke Maternal Grandfather   . Colon cancer Mother     mother age 51  . Diabetes Other     siblings  . Hypertension Other     siblings   Social History  Substance Use Topics  . Smoking status: Never Smoker   . Smokeless tobacco: Never Used  . Alcohol Use: No   OB History    Gravida Para Term Preterm AB TAB SAB Ectopic Multiple Living   2 2             Review of Systems Unable to perform due to mental status change   Allergies  Bee venom;  Peanut-containing drug products; Hydrocodone; Ambien; Aspirin; and Latex  Home Medications   Prior to Admission medications   Medication Sig Start Date End Date Taking? Authorizing Provider  Cholecalciferol (VITAMIN D PO) Take by mouth.    Historical Provider, MD  cyclobenzaprine (FLEXERIL) 10 MG tablet Take 1 tablet (10 mg total) by mouth 3 (three) times daily as needed for muscle spasms. 01/22/16   Colon Branch, MD  famotidine (PEPCID) 20 MG tablet One at bedtime 12/06/15   Tanda Rockers, MD  felodipine (PLENDIL) 10 MG 24 hr tablet Take 1 tablet (10 mg total) by mouth daily. 07/17/15   Colon Branch, MD  furosemide (LASIX) 20 MG tablet Take 1 tablet (20 mg total) by mouth daily. 01/22/16   Colon Branch, MD  Multiple Vitamin (MULTIVITAMIN) tablet Take 1 tablet by mouth 2 (two) times daily.      Historical Provider, MD  Nebivolol HCl 20 MG TABS Take 1 tablet (20 mg total) by mouth 2 (two) times daily. 07/17/15   Colon Branch, MD  pantoprazole (PROTONIX) 40 MG tablet Take 1 tablet (40 mg total) by mouth daily. Take 30-60 min before first meal of the day 12/06/15   Tanda Rockers, MD  predniSONE (DELTASONE)  10 MG tablet 4 tablets x 2 days, 3 tabs x 2 days, 2 tabs x 2 days, 1 tab x 2 days 01/22/16   Colon Branch, MD   BP 179/109 mmHg  Pulse 92  Resp 13  SpO2 99% Physical Exam  Constitutional: She appears well-developed. She appears distressed.  HENT:  Head: Normocephalic and atraumatic.  Eyes: Pupils are equal, round, and reactive to light.  Rightward gaze deviation  Neck: Normal range of motion. Neck supple. No JVD present.  Cardiovascular: Normal rate and regular rhythm.   Pulmonary/Chest: Effort normal.  Abdominal: Soft. She exhibits no distension.  Musculoskeletal: She exhibits no edema.  Neurological:  Opens eyes to voice only. complete neglect of the left side.  Moves right upper and lower extremities spontaneously Rightward gaze preference Will not follow commands  Skin: Skin is warm and dry.   Nursing note and vitals reviewed.   ED Course  Procedures (including critical care time) Labs Review Labs Reviewed  I-STAT CHEM 8, ED - Abnormal; Notable for the following:    Potassium 3.0 (*)    Chloride 99 (*)    Glucose, Bld 135 (*)    Hemoglobin 16.0 (*)    HCT 47.0 (*)    All other components within normal limits  ETHANOL  PROTIME-INR  APTT  CBC  DIFFERENTIAL  COMPREHENSIVE METABOLIC PANEL  URINE RAPID DRUG SCREEN, HOSP PERFORMED  URINALYSIS, ROUTINE W REFLEX MICROSCOPIC (NOT AT [facility])  I-STAT TROPOININ, ED    Imaging Review Ct Head Wo Contrast  04/01/2016  CLINICAL DATA:  Headache and sudden loss of consciousness EXAM: CT HEAD WITHOUT CONTRAST TECHNIQUE: Contiguous axial images were obtained from the base of the skull through the vertex without intravenous contrast. COMPARISON:  July 18, 2014 FINDINGS: There is a focal area of hemorrhage arising in the posterior, lateral right basal ganglia region measuring 4.5 x 3.4 x 3.4 cm with mild surrounding edema. There is subarachnoid hemorrhage near this intraparenchymal hemorrhage with blood filling the right sylvian fissure. There is effacement of the right lateral ventricle with 7 mm of midline shift toward the left. There is no subdural or epidural fluid. Elsewhere gray-white compartments appear normal. There is no hydrocephalus. The bony calvarium appears intact. The mastoid air cells are clear. There is mucosal thickening in both maxillary antra. No intraorbital lesions are evident. IMPRESSION: Intraparenchymal hemorrhage arising from the posterior, lateral right basal ganglia with subarachnoid hemorrhage extending into the sylvian fissure on the right. There is mass effect with mild midline shift toward the left of approximately 7 mm. Elsewhere gray-white compartments normal. Mild paranasal sinus disease in the maxillary antra bilaterally. Volume of hemorrhage in the right basal ganglia region is approximately 26 cubic cm. Note  that there is additional hemorrhage in the subarachnoid space on the right which cannot be easily quantified with respect to specific volume of hemorrhage. Critical Value/emergent results were called by telephone at the time of interpretation on 04/01/2016 at 3:53 pm to Dr. Gareth Morgan , who verbally acknowledged these results. This report was also communicated as a critical value emergently to Dr. Silverio Decamp, neurology. Electronically Signed   By: Lowella Grip III M.D.   On: 04/01/2016 15:54   I have personally reviewed and evaluated these images and lab results as part of my medical decision-making.   EKG Interpretation   Date/Time:  Wednesday April 01 2016 15:55:18 EDT Ventricular Rate:  89 PR Interval:  149 QRS Duration: 90 QT Interval:  401 QTC Calculation: 488 R  Axis:   -45 Text Interpretation:  Sinus rhythm Left anterior fascicular block  Borderline T wave abnormalities Borderline prolonged QT interval No  significant change since last tracing Confirmed by [facility] MD, [place]  ([id number]) on 04/01/2016 4:31:56 PM      MDM   Final diagnoses:  None   Patient presents with hemorrhagic stroke. Upon arrival the patient has a GCS of 9-10, but seemed to precipitously decline. Code stroke activated on arrival, CT shows large basal ganglia bleed. Neurosurgery consulted, saw patient in ED, does not believe there is need for surgical intervention at this time.  Patient briefly had decline in mental status, and worsening of her exam, with vomiting, and there was concern she would need to be intubated. She spontaneously improved however, and has been protecting her airway well now. IV calcium channel blockers use to control blood pressure, drips titrated to achieve a systolic blood pressure less than 140.  The patient will be admitted to neuro ICU.  Leata Mouse, MD 04/02/16 [id number]  Gareth Morgan, MD 04/08/16 1409

## 2016-04-01 NOTE — ED Notes (Signed)
Paged neurosurgery/CABBELL

## 2016-04-01 NOTE — Consult Note (Signed)
PULMONARY / CRITICAL CARE MEDICINE   Name: Abigail Campos MRN: [id number] DOB: [birth year]    ADMISSION DATE:  04/01/2016 CONSULTATION DATE:  04/01/2016  REFERRING MD:  EDP  CHIEF COMPLAINT:  ICH  HISTORY OF PRESENT ILLNESS: 45 year old female with PMH of HTN, who presented to [facility] ED 4/19 after developing headache at work. Around 1500 she developed Rightward gaze deviation, L sided paralysis, and L facial droop. She quickly became unresponsive. EMS was called and she was transferred to [facility] ED for further evaluation. CT scan demonstrated Intraparenchymal hemorrhage arising from the posterior, lateral right basal ganglia with subarachnoid hemorrhage extending into the sylvian fissure on the right. 55mm mass effect. She was profoundly hypertensive requiring nicardipine infusion. At risk intubation, PCCM consulted.    PAST MEDICAL HISTORY :  She  has a past medical history of Headache(784.0); Hypertension; and Essential hypertension (10/19/2007).  PAST SURGICAL HISTORY: She  has past surgical history that includes Inguinal hernia repair; Cesarean section; and Abdominal hysterectomy (11-14-07).  Allergies  Allergen Reactions  . Bee Venom Anaphylaxis  . Peanut-Containing Drug Products Anaphylaxis  . Hydrocodone Hives    Generalize itching w/o rash  . Ambien [Zolpidem Tartrate] Other (See Comments)    forgetfulness  . Aspirin Swelling    Reports blisters w/ ASA but pt reports is ok w/ Motrin, Advil, naproxen  . Latex Hives    No current facility-administered medications on file prior to encounter.   Current Outpatient Prescriptions on File Prior to Encounter  Medication Sig  . Cholecalciferol (VITAMIN D PO) Take by mouth.  . cyclobenzaprine (FLEXERIL) 10 MG tablet Take 1 tablet (10 mg total) by mouth 3 (three) times daily as needed for muscle spasms.  . famotidine (PEPCID) 20 MG tablet One at bedtime  . felodipine (PLENDIL) 10 MG 24 hr tablet Take 1 tablet (10 mg total) by  mouth daily.  . furosemide (LASIX) 20 MG tablet Take 1 tablet (20 mg total) by mouth daily.  . Multiple Vitamin (MULTIVITAMIN) tablet Take 1 tablet by mouth 2 (two) times daily.    . Nebivolol HCl 20 MG TABS Take 1 tablet (20 mg total) by mouth 2 (two) times daily.  . pantoprazole (PROTONIX) 40 MG tablet Take 1 tablet (40 mg total) by mouth daily. Take 30-60 min before first meal of the day  . predniSONE (DELTASONE) 10 MG tablet 4 tablets x 2 days, 3 tabs x 2 days, 2 tabs x 2 days, 1 tab x 2 days    FAMILY HISTORY:  Her indicated that her father is alive.   SOCIAL HISTORY: She  reports that she has never smoked. She has never used smokeless tobacco. She reports that she does not drink alcohol or use illicit drugs.  REVIEW OF SYSTEMS:   unable  SUBJECTIVE:    VITAL SIGNS: There were no vitals taken for this visit.  HEMODYNAMICS:    VENTILATOR SETTINGS:    INTAKE / OUTPUT:    PHYSICAL EXAMINATION: General:  Overweight female in NAD Neuro:  Somnolent, opens eyes to verbal R gaze. Not following commands. PERRL 41mm bilateral. L side flacid, withdraws on R. +Gag Cardiovascular:  RRR, no MRG Lungs:  Even, nonlabored on room air Abdomen:  Soft, non-distended Musculoskeletal:  No acute deformity, ROM as above Skin:  Grossly intact  LABS:  BMET  Recent Labs Lab 04/01/16 1541  NA 139  K 3.0*  CL 99*  BUN 10  CREATININE 0.80  GLUCOSE 135*    Electrolytes No  results for input(s): CALCIUM, MG, PHOS in the last 168 hours.  CBC  Recent Labs Lab 04/01/16 1541  HGB 16.0*  HCT 47.0*    Coag's No results for input(s): APTT, INR in the last 168 hours.  Sepsis Markers No results for input(s): LATICACIDVEN, PROCALCITON, O2SATVEN in the last 168 hours.  ABG No results for input(s): PHART, PCO2ART, PO2ART in the last 168 hours.  Liver Enzymes No results for input(s): AST, ALT, ALKPHOS, BILITOT, ALBUMIN in the last 168 hours.  Cardiac Enzymes No results for  input(s): TROPONINI, PROBNP in the last 168 hours.  Glucose No results for input(s): GLUCAP in the last 168 hours.  Imaging No results found.   STUDIES:  CT head 4/19 > Intraparenchymal hemorrhage arising from the posterior, lateral right basal ganglia with subarachnoid hemorrhage extending into the sylvian fissure on the right. There is mass effect with mild midline shift toward the left of approximately 7 mm. Note that there is additional hemorrhage in the subarachnoid space on the right which cannot be easily quantified with respect to specific volume of hemorrhage  CULTURES: none  ANTIBIOTICS: none  SIGNIFICANT EVENTS: 4/19 admit ICH  LINES/TUBES: ETT 4/19 >   DISCUSSION: 45 year old female with hypertensive ICH. Intubated for airway protection. On clevidipine for BP control.  ASSESSMENT / PLAN:  NEUROLOGIC A:   R basal ganglia ICH with midline shift SAH  P:   RASS goal: -1 Neurology primary Neurosurgery consultation  PULMONARY A: At risk inability to protect airway  P:   STAT intubation PRVC, 8cc/kg CXR for ETT placement ABG Vent bundle  CARDIOVASCULAR A:  Hypertensive emergency  P:  Telemetry SBP goal 120-141mmHg per nuero Clevidipine infusion for SBP goal PRN hydralazine Holding home felodipine, lasix, nebivolol   RENAL A:   Hypokalemia  P:   Supp K Follow BMP  GASTROINTESTINAL A:   No acute issues  P:   Pepcid for SUP NPO  HEMATOLOGIC A:   No acute issues  P:  Follow CBC Check coags  INFECTIOUS A:   No acute issues  P:     ENDOCRINE A:   No acute issues  P:        FAMILY  - Updates:   - Inter-disciplinary family meet or Palliative Care meeting due by:  4/26    Georgann Housekeeper, AGACNP-BC [place] Pager [phone] or [phone]  04/01/2016 4:23 PM   Attending Note:  I have examined patient, reviewed labs, studies and notes. I have discussed the case with Jaclynn Guarneri, and  I agree with the data and plans as amended above. 45 yo woman presents with hypertension and HA, developed focal deficits and then lost consciousness. EMS was activated. Found to have a L basal ganglia ICH. On my eval she remains hypertensive, has a dense hemiplegia, is unable to speak. She appears to be managing her secretions. I do not believe she needs to be intubated for airway protection at this time. She needs aggressive BP control.  I discussed the case with Dr Cyndy Freeze and there is minimal edema or ventricular distention. No indication for ventricular drain at this time. We will follow in event she requires intubation. Will defer timing of repeat imaging to neurology. Independent critical care time is 45 minutes.   Baltazar Apo, MD, PhD 04/01/2016, 4:34 PM [facility] [phone] or if no answer [phone]

## 2016-04-01 NOTE — H&P (Signed)
H&P    Chief Complaint: Code stroke    HPI:                                                                                                                                         Abigail Campos is an 45 y.o. female with history of HA and HTN. She was at work today and complaining of a HA. At 1500 she was noted by her peers to have a sudden onset of left facial droop, right gaze and not talking then became unresponsive. EMS arrived and BP was elevated at 99991111 systolic. Initial CT head showed a right BG ICH.    Date last known well: Today Time last known well: Time: 15:00 tPA Given: No: ICH     Past Medical History  Diagnosis Date  . Headache(784.0)     developed migraines after 2nd pregnancy, had a negative CT of the head  . Hypertension   . Essential hypertension 10/19/2007    04-05-10: metoprolol changed to bystolic (was not feeling well on it, no specific allergy or reaction)      Past Surgical History  Procedure Laterality Date  . Inguinal hernia repair      2003-04-06  . Cesarean section      [id number]  . Abdominal hysterectomy  11-14-07    no oophorectomy per surgical report    Family History  Problem Relation Age of Onset  . Diabetes Mother   . Hypertension Mother   . Glaucoma Mother   . Heart disease      2 uncles died Apr 06, 2011 from CAD-CHF  . Stroke Maternal Grandfather   . Colon cancer Mother     mother age 38  . Diabetes Other     siblings  . Hypertension Other     siblings   Social History:  reports that she has never smoked. She has never used smokeless tobacco. She reports that she does not drink alcohol or use illicit drugs.  Allergies:  Allergies  Allergen Reactions  . Bee Venom Anaphylaxis  . Peanut-Containing Drug Products Anaphylaxis  . Hydrocodone Hives    Generalize itching w/o rash  . Ambien [Zolpidem Tartrate] Other (See Comments)    forgetfulness  . Aspirin Swelling    Reports blisters w/ ASA but pt reports is ok w/ Motrin, Advil,  naproxen  . Latex Hives    Medications:  Current Facility-Administered Medications  Medication Dose Route Frequency Provider Last Rate Last Dose  . nicardipine (CARDENE) 20mg  in 0.86% saline 260ml IV infusion (0.1 mg/ml)  3-15 mg/hr Intravenous Continuous Ram Fuller Mandril, MD       Current Outpatient Prescriptions  Medication Sig Dispense Refill  . Cholecalciferol (VITAMIN D PO) Take by mouth.    . cyclobenzaprine (FLEXERIL) 10 MG tablet Take 1 tablet (10 mg total) by mouth 3 (three) times daily as needed for muscle spasms. 21 tablet 0  . famotidine (PEPCID) 20 MG tablet One at bedtime 30 tablet 2  . felodipine (PLENDIL) 10 MG 24 hr tablet Take 1 tablet (10 mg total) by mouth daily. 30 tablet 4  . furosemide (LASIX) 20 MG tablet Take 1 tablet (20 mg total) by mouth daily. 30 tablet 3  . Multiple Vitamin (MULTIVITAMIN) tablet Take 1 tablet by mouth 2 (two) times daily.      . Nebivolol HCl 20 MG TABS Take 1 tablet (20 mg total) by mouth 2 (two) times daily. 60 tablet 4  . pantoprazole (PROTONIX) 40 MG tablet Take 1 tablet (40 mg total) by mouth daily. Take 30-60 min before first meal of the day 30 tablet 2  . predniSONE (DELTASONE) 10 MG tablet 4 tablets x 2 days, 3 tabs x 2 days, 2 tabs x 2 days, 1 tab x 2 days 20 tablet 0     ROS:                                                                                                                                       History obtained from unobtainable from patient due to mental status   Neurologic Examination:                                                                                                      There were no vitals taken for this visit.  HEENT-  Normocephalic, no lesions, without obvious abnormality.  Normal external eye and conjunctiva.  Normal TM's bilaterally.  Normal auditory canals and  external ears. Normal external nose, mucus membranes and septum.  Normal pharynx. Cardiovascular- S1, S2 normal, pulses palpable throughout   Lungs- chest clear, no wheezing, rales, normal symmetric air entry Abdomen- normal findings: bowel sounds normal Extremities- no edema Lymph-no adenopathy palpable Musculoskeletal-no joint tenderness, deformity or swelling Skin-warm and dry, no hyperpigmentation, vitiligo, or suspicious lesions  Neurological Examination Mental Status: Patient does not respond to verbal stimuli.  Doe respond  to deep sternal rub with wincing and shows a left facial droop.  Does not follow commands.  No verbalizations noted.  Cranial Nerves: II: patient doe respond confrontation on the left, pupils right 2 mm, left 2 mm,and reactive bilaterally III,IV,VI: doll's response present. Right gaze preference  V,VII:winces to pain and shows left facial droop VIII: patient does not respond to verbal stimuli IX,X: cough intact XII: tongue strength unable to test Motor: Right arm and leg shows some movement and minimal to no movement on the left Sensory:  respond to noxious stimuli in all extremity. Deep Tendon Reflexes:  1+ throughout Plantars: downgoing bilaterally Cerebellar: Unable to perform       Lab Results: Basic Metabolic Panel: No results for input(s): NA, K, CL, CO2, GLUCOSE, BUN, CREATININE, CALCIUM, MG, PHOS in the last 168 hours.  Liver Function Tests: No results for input(s): AST, ALT, ALKPHOS, BILITOT, PROT, ALBUMIN in the last 168 hours. No results for input(s): LIPASE, AMYLASE in the last 168 hours. No results for input(s): AMMONIA in the last 168 hours.  CBC: No results for input(s): WBC, NEUTROABS, HGB, HCT, MCV, PLT in the last 168 hours.  Cardiac Enzymes: No results for input(s): CKTOTAL, CKMB, CKMBINDEX, TROPONINI in the last 168 hours.  Lipid Panel: No results for input(s): CHOL, TRIG, HDL, CHOLHDL, VLDL, LDLCALC in the last 168  hours.  CBG: No results for input(s): GLUCAP in the last 168 hours.  Microbiology: Results for orders placed or performed in visit on 04/05/13  Throat culture Randell Loop)     Status: None   Collection Time: 04/05/13  1:50 PM  Result Value Ref Range Status   Organism ID, Bacteria Normal Upper Respiratory Flora  Final   Organism ID, Bacteria No Beta Hemolytic Streptococci Isolated  Final    Coagulation Studies: No results for input(s): LABPROT, INR in the last 72 hours.  Imaging: No results found.     Assessment and plan discussed with with attending physician and they are in agreement.    Etta Quill PA-C [facility] [phone]  04/01/2016, 3:39 PM   Assessment: 45 y.o. female   Stroke Risk Factors - hypertension

## 2016-04-02 ENCOUNTER — Encounter (HOSPITAL_COMMUNITY): Payer: Self-pay | Admitting: Radiology

## 2016-04-02 ENCOUNTER — Encounter (HOSPITAL_COMMUNITY): Payer: Managed Care, Other (non HMO)

## 2016-04-02 ENCOUNTER — Inpatient Hospital Stay (HOSPITAL_COMMUNITY): Payer: Managed Care, Other (non HMO)

## 2016-04-02 DIAGNOSIS — G9340 Encephalopathy, unspecified: Secondary | ICD-10-CM | POA: Diagnosis present

## 2016-04-02 LAB — TRIGLYCERIDES: Triglycerides: 76 mg/dL (ref <150)

## 2016-04-02 IMAGING — CT CT HEAD W/O CM
4 series · 16 of 47 positions shown, 18 images · non-contrast
Comparison: CT head [DATE].  CT angio head and neck is ordered.

CLINICAL DATA: Cerebral hemorrhage. Continued surveillance. High
blood pressure. LEFT-sided weakness.

EXAM:
CT HEAD WITHOUT CONTRAST
TECHNIQUE: Contiguous axial images were obtained from the base of the skull
through the vertex without intravenous contrast.

[Series 3: head bone · axial · 0.41mm/px · z∈[-61,-33]mm · 3 of 73 slices shown]
[im 8/73  bone]
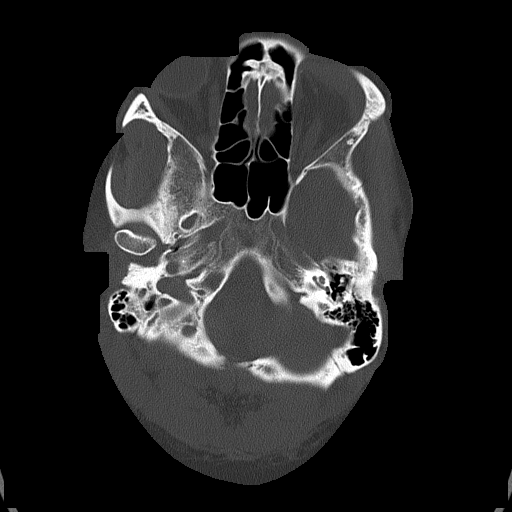
[im 15/73  bone]
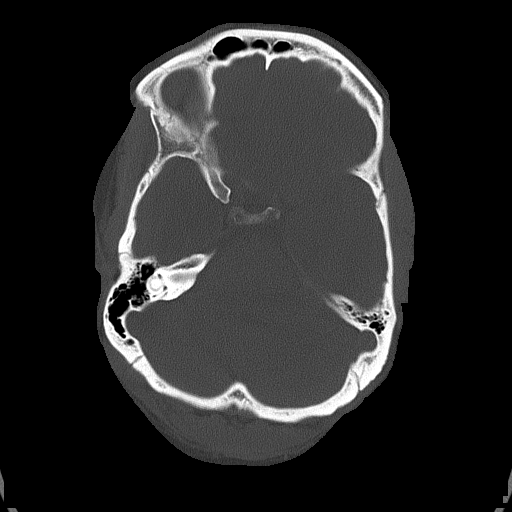
[im 22/73  bone]
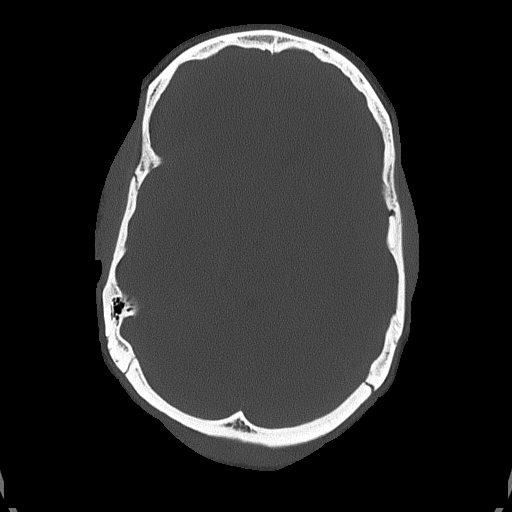

[Series 4: head without · axial · non-contrast · 0.41mm/px · z∈[-60,+45]mm · 7 of 29 slices shown, 9 images]
[im 4/29  brain]
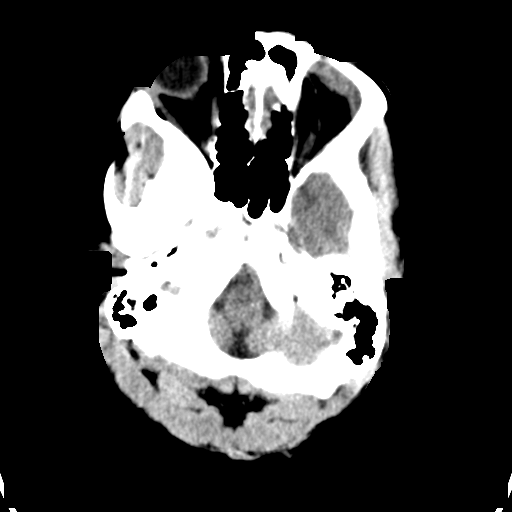
[im 4/29  bone]
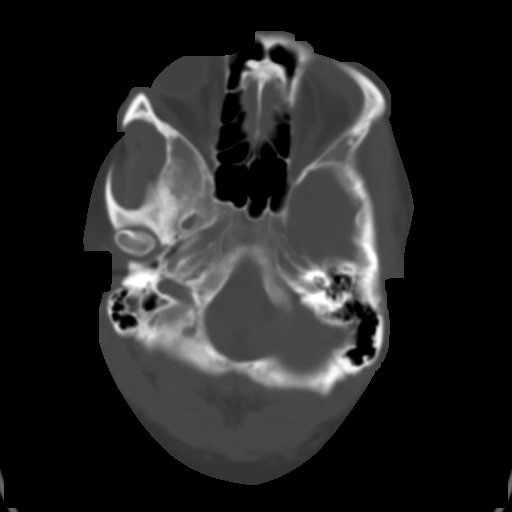
[im 8/29  brain]
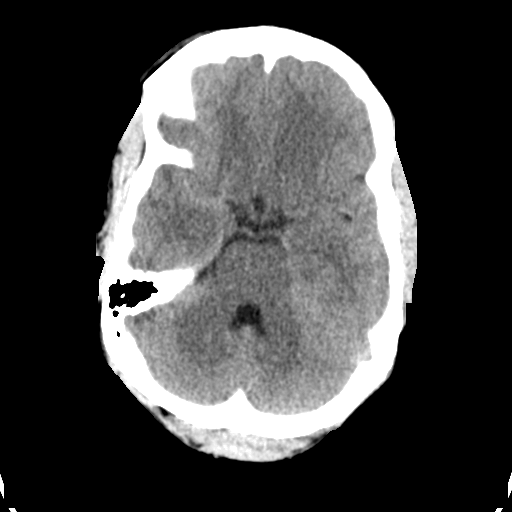
[im 11/29  brain]
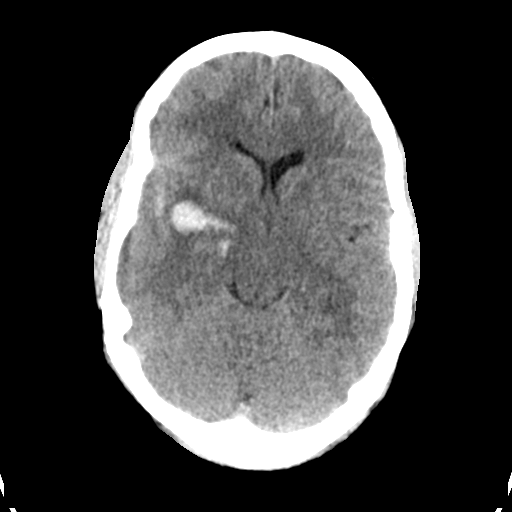
[im 15/29  brain]
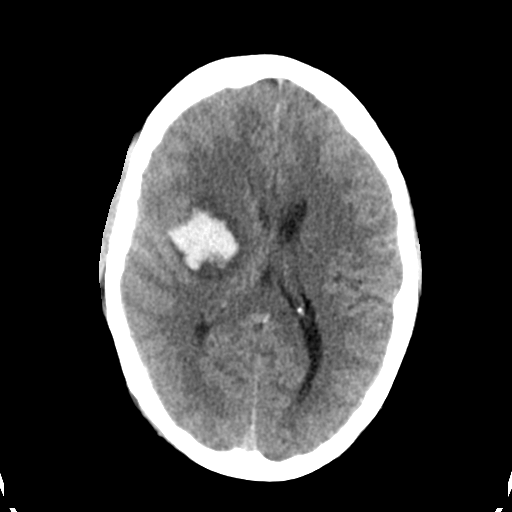
[im 18/29  brain]
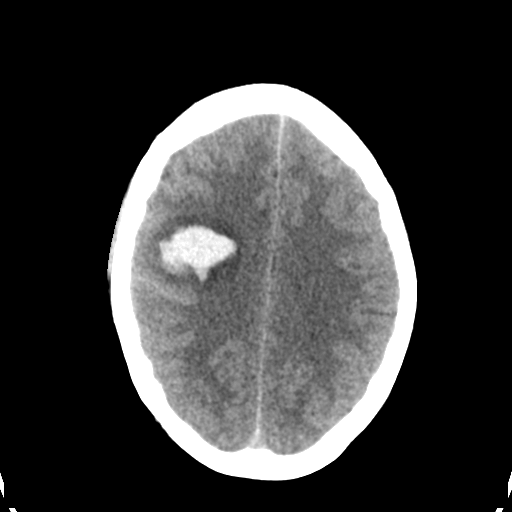
[im 18/29  bone]
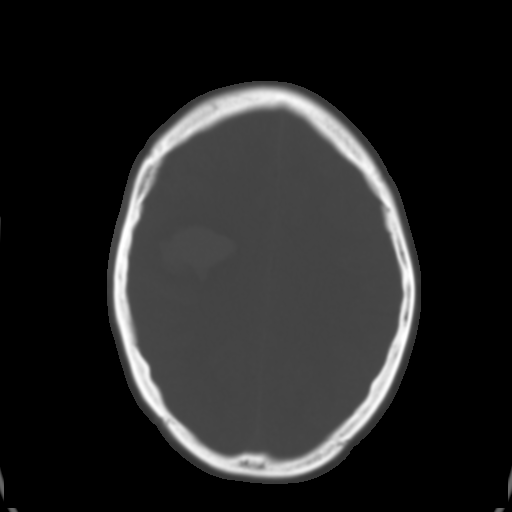
[im 22/29  brain]
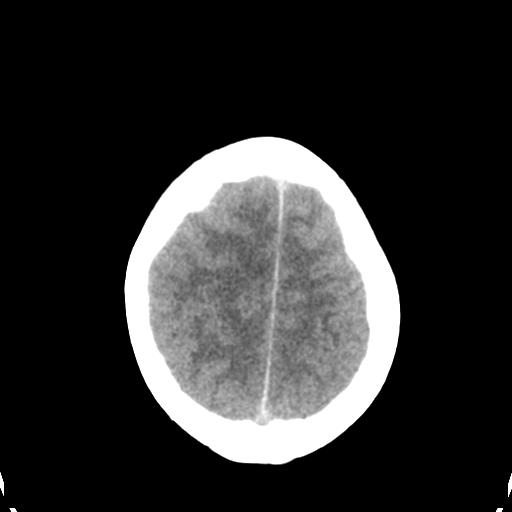
[im 25/29  brain]
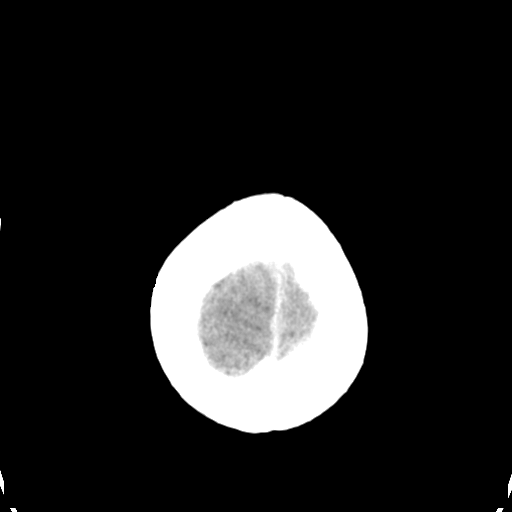

[Series 5: head without cor st · coronal · non-contrast · 0.27mm/px · 3 of 66 slices shown]
[im 22/66  brain]
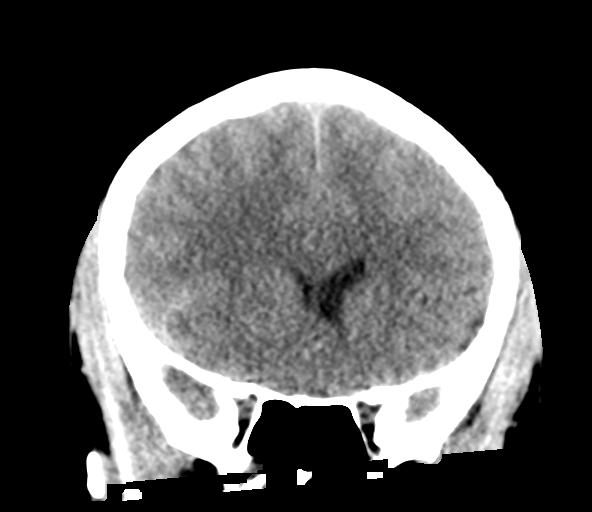
[im 29/66  brain]
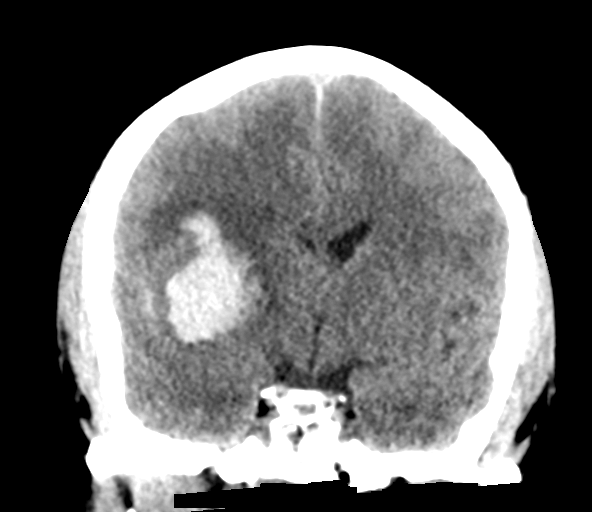
[im 37/66  brain]
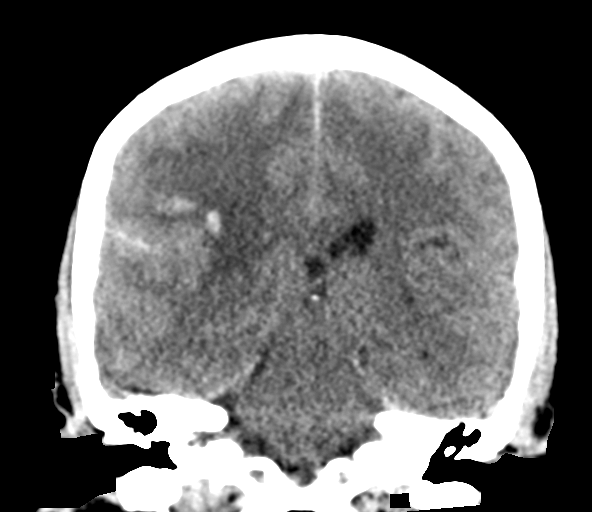

[Series 6: head without sag st · sagittal · non-contrast · 0.27mm/px · 3 of 48 slices shown]
[im 16/48  brain]
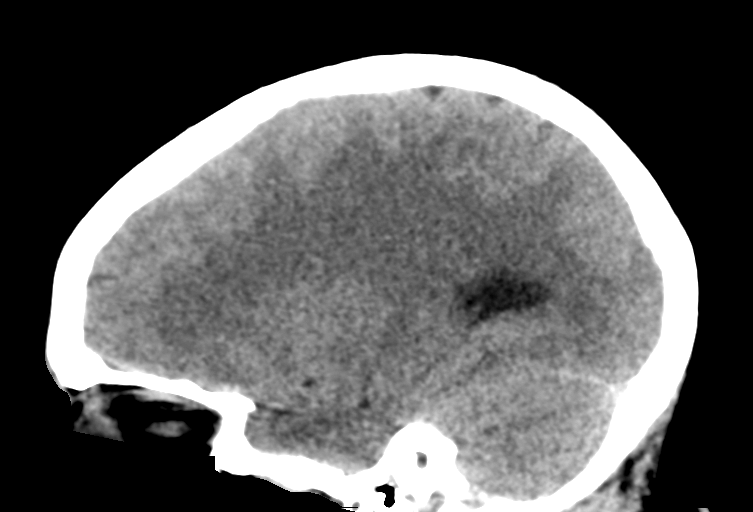
[im 24/48  brain]
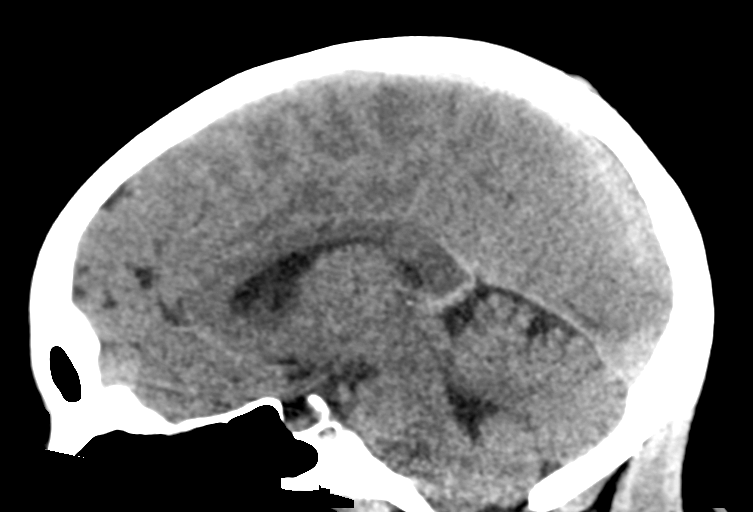
[im 32/48  brain]
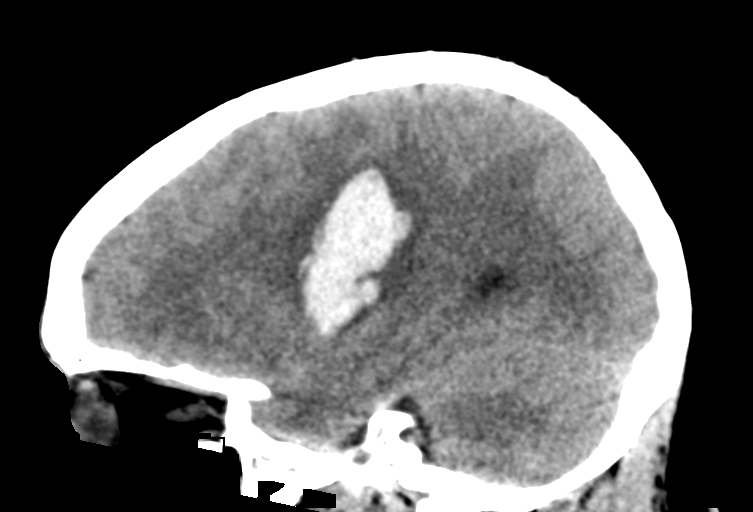

[16 of 47 positions shown; findings below may reference images not displayed]

FINDINGS: Redemonstrated is a 29 x 23 x 40 mm RIGHT cerebral hemisphere
hemorrhage, approximately 13 mL volume, favored to represent a
hypertensive related hemorrhage affecting the lentiform nucleus.
Trace subarachnoid blood extends to the sylvian fissure. This likely
has resulted from extension of the hematoma through the cortex, but
CTA head neck should be performed to exclude a cerebral aneurysm.

Compared with yesterday's exam, a similar appearance is noted.
Measurements are slightly smaller in that the core hemorrhage is
measured today rather than the core + surrounding edema.
IMPRESSION: Stable RIGHT putaminal intracerebral hemorrhage, favored to
represent a hypertensive related event.

## 2016-04-02 IMAGING — CT CT ANGIO HEAD
2 of 11 series · 7 of 36 positions shown · IV contrast (OMNI 350)
Comparison: Head CT [DATE] and earlier. Noncontrast head CT
from 958 hours today, reported separately

CLINICAL DATA: 44-year-old female with right basal ganglia region
acute intracranial hemorrhage diagnosed yesterday following headache
and sudden loss of consciousness. Initial encounter.

EXAM:
CT ANGIOGRAPHY HEAD AND NECK
TECHNIQUE: Multidetector CT imaging of the head and neck was performed using
the standard protocol during bolus administration of intravenous
contrast. Multiplanar CT image reconstructions and MIPs were
obtained to evaluate the vascular anatomy. Carotid stenosis
measurements (when applicable) are obtained utilizing NASCET
criteria, using the distal internal carotid diameter as the
denominator.
CONTRAST:  80 mL Isovue 370.

[Series 13: cta neck axial · axial · 0.39mm/px · z∈[-195,+8]mm · 5 of 310 slices shown]
[im 52/310  soft-tissue]
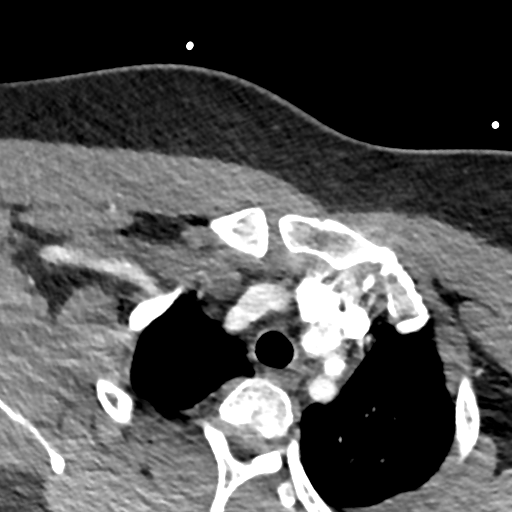
[im 104/310  bone]
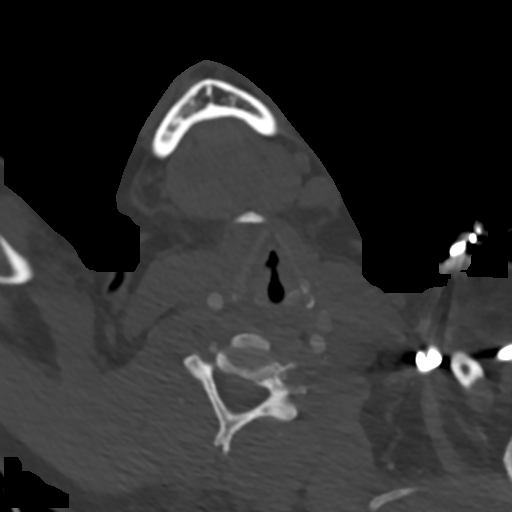
[im 155/310  soft-tissue]
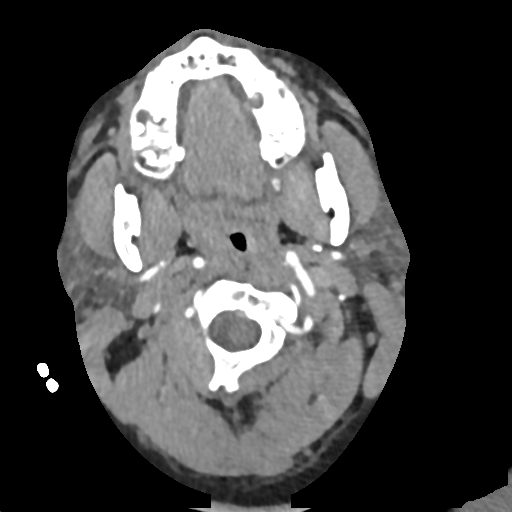
[im 207/310  bone]
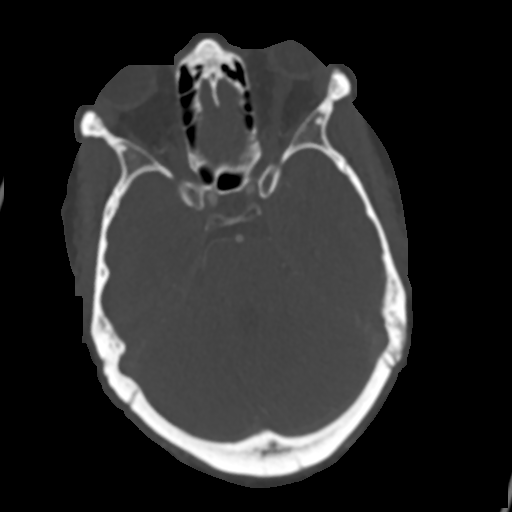
[im 258/310  soft-tissue]
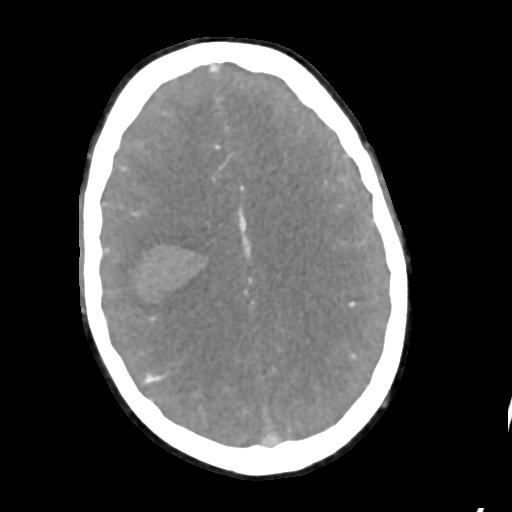

[Series 15: cta neck sagittal · sagittal · 0.46mm/px · 2 of 165 slices shown]
[im 30/165  soft-tissue]
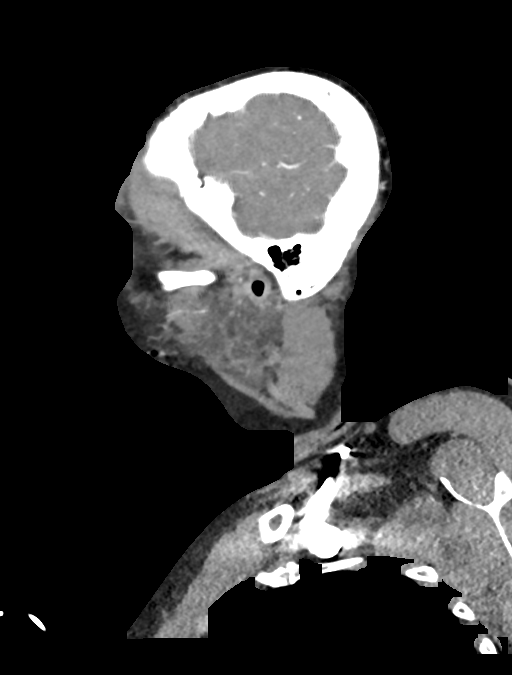
[im 136/165  soft-tissue]
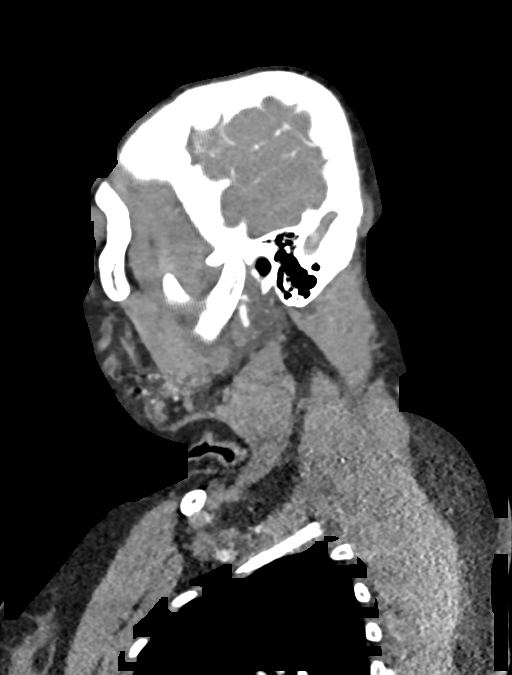

[7 of 36 positions shown; findings below may reference images not displayed]

FINDINGS: CTA NECK

Skeleton: Poor dentition. No acute osseous abnormality identified.

Visualized paranasal sinuses and mastoids are clear.

Other neck: Negative lung apices. No superior mediastinal
lymphadenopathy.

Thyroid, larynx, pharynx, parapharyngeal spaces, retropharyngeal
space, sublingual space, submandibular glands and parotid glands are
within normal limits. No cervical lymphadenopathy.

Aortic arch: 3 vessel arch configuration. No arch atherosclerosis or
great vessel origin stenosis.

Right carotid system: Negative.

Left carotid system: Mildly tortuous left CCA. Mildly tortuous
cervical left ICA. Otherwise negative.

Vertebral arteries:No proximal subclavian artery stenosis. Normal
vertebral artery origins. Tortuous left V1 segment. Otherwise
negative to the skullbase.

CTA HEAD

Posterior circulation: Negative distal vertebral arteries. Normal
PICA origins and vertebrobasilar junction. Normal AICA origins,
basilar artery, SCA origins and PCA origins. Left posterior
communicating artery is diminutive, the right is smaller or absent.
Bilateral PCA branches are within normal limits.

Anterior circulation: Mild left ICA siphon calcified plaque without
stenosis. Both siphons are patent without stenosis. Normal
ophthalmic and left posterior communicating artery origins. Normal
carotid termini. Normal MCA and ACA origins. Anterior communicating
artery and bilateral ACA branches are within normal limits. Left MCA
M1 segment, bifurcation, and left MCA branches are within normal
limits.

Right MCA M1 segment, bifurcation, and right MCA branches are normal
aside from mild mass effect on the posterior right MCA division
related to the intra-axial hemorrhage. No CTA spot sign is
associated. No evidence of abnormal venous drainage or vascular
malformation.

Venous sinuses: Patent.

Anatomic variants: None.

Delayed phase: Hyperdense intra-axial hemorrhage with epicenter at
the right basal ganglia and right frontal lobe white matter today
measures 27 x 36 by 43 mm (AP by transverse by CC) for an estimated
blood volume of 21 mL. Surrounding hypodense edema has mildly
increased since yesterday, but overall blood volume has not
significantly changed. Mild regional mass effect has not
significantly changed. Stable gray-white matter differentiation
elsewhere.
IMPRESSION: 1. Negative arterial findings on CTA head and neck, aside from mild
calcified atherosclerosis of the left ICA siphon.
2. Stable hyperdense intra-axial hemorrhage in the right hemisphere
since yesterday. Negative CTA Spot Sign, and no evidence of
associated vascular malformation.
Estimated blood volume of 21 mL. Mildly increased surrounding edema,
but stable mild regional mass effect.
3. Poor dentition.

## 2016-04-02 MED ORDER — ONDANSETRON HCL 4 MG/2ML IJ SOLN
4.0000 mg | Freq: Three times a day (TID) | INTRAMUSCULAR | Status: DC | PRN
  Administered 2016-04-02: 4 mg via INTRAVENOUS

## 2016-04-02 MED ORDER — IOPAMIDOL (ISOVUE-370) INJECTION 76%
INTRAVENOUS | Status: AC
  Administered 2016-04-02: 80 mL
  Filled 2016-04-02: qty 100

## 2016-04-02 MED ORDER — ONDANSETRON HCL 4 MG/2ML IJ SOLN
INTRAMUSCULAR | Status: AC
  Filled 2016-04-02: qty 2

## 2016-04-02 NOTE — Consult Note (Addendum)
PULMONARY / CRITICAL CARE MEDICINE   Name: Abigail Campos MRN: [id number] DOB: Jun 15, 1971    ADMISSION DATE:  04/01/2016 CONSULTATION DATE:  04/01/2016  REFERRING MD:  EDP  CHIEF COMPLAINT:  ICH BRIEF: 45 year old female with PMH of HTN, who presented to [facility] ED 4/19 after developing headache at work. Around 1500 she developed Rightward gaze deviation, L sided paralysis, and L facial droop. She quickly became unresponsive. EMS was called and she was transferred to [facility] ED for further evaluation. CT scan demonstrated Intraparenchymal hemorrhage arising from the posterior, lateral right basal ganglia with subarachnoid hemorrhage extending into the sylvian fissure on the right. 27mm mass effect. She was profoundly hypertensive requiring nicardipine infusion. At risk intubation, PCCM consulted.      CULTURES: none  ANTIBIOTICS: none  SIGNIFICANT EVENTS: 4/19 admit ICH  LINES/TUBES: ETT 4/19 >    SUBJECTIVE:  4/20 - husband reports chronic cough since fall 2016. But resolved early 2017. Was on stseroid burst per records but not recently. Denies sarcoid dx. CXR dec 2016 was clear. Currently sleepy but easily arousable. On clevprex   VITAL SIGNS: BP 127/75 mmHg  Pulse 93  Temp(Src) 98.4 F (36.9 C) (Core (Comment))  Resp 23  Ht 4\' 9"  (1.448 m)  Wt 73.5 kg (162 lb 0.6 oz)  BMI 35.06 kg/m2  SpO2 97%  HEMODYNAMICS:    VENTILATOR SETTINGS:    INTAKE / OUTPUT: I/O last 3 completed shifts: In: 2185.5 [I.V.:1835.5; IV Piggyback:350] Out: 850 [Urine:850]  PHYSICAL EXAMINATION: General:  Overweight female in NAD Neuro:  Somnolent, opens eyes to verbal R gaze. Not following commands. PERRL 19mm bilateral. L side flacid, withdraws on R. +Gag Cardiovascular:  RRR, no MRG Lungs:  Even, nonlabored on room air Abdomen:  Soft, non-distended Musculoskeletal:  No acute deformity, ROM as above Skin:  Grossly intact  LABS:  PULMONARY  Recent Labs Lab 04/01/16 1541   TCO2 26    CBC  Recent Labs Lab 04/01/16 1533 04/01/16 1541  HGB 13.4 16.0*  HCT 41.6 47.0*  WBC 9.3  --   PLT 304  --     COAGULATION  Recent Labs Lab 04/01/16 1533  INR 1.02    CARDIAC  No results for input(s): TROPONINI in the last 168 hours. No results for input(s): PROBNP in the last 168 hours.   CHEMISTRY  Recent Labs Lab 04/01/16 1533 04/01/16 1541  NA 136 139  K 3.0* 3.0*  CL 101 99*  CO2 22  --   GLUCOSE 133* 135*  BUN 9 10  CREATININE 0.89 0.80  CALCIUM 9.5  --    Estimated Creatinine Clearance: 76.1 mL/min (by C-G formula based on Cr of 0.8).   LIVER  Recent Labs Lab 04/01/16 1533  AST 26  ALT 24  ALKPHOS 69  BILITOT 0.5  PROT 8.2*  ALBUMIN 4.0  INR 1.02     INFECTIOUS No results for input(s): LATICACIDVEN, PROCALCITON in the last 168 hours.   ENDOCRINE CBG (last 3)   Recent Labs  04/01/16 1605  GLUCAP 128*         IMAGING x48h  - image(s) personally visualized  -   highlighted in bold Ct Head Wo Contrast  04/01/2016  CLINICAL DATA:  Headache and sudden loss of consciousness EXAM: CT HEAD WITHOUT CONTRAST TECHNIQUE: Contiguous axial images were obtained from the base of the skull through the vertex without intravenous contrast. COMPARISON:  July 18, 2014 FINDINGS: There is a focal area of hemorrhage arising  in the posterior, lateral right basal ganglia region measuring 4.5 x 3.4 x 3.4 cm with mild surrounding edema. There is subarachnoid hemorrhage near this intraparenchymal hemorrhage with blood filling the right sylvian fissure. There is effacement of the right lateral ventricle with 7 mm of midline shift toward the left. There is no subdural or epidural fluid. Elsewhere gray-white compartments appear normal. There is no hydrocephalus. The bony calvarium appears intact. The mastoid air cells are clear. There is mucosal thickening in both maxillary antra. No intraorbital lesions are evident. IMPRESSION: Intraparenchymal  hemorrhage arising from the posterior, lateral right basal ganglia with subarachnoid hemorrhage extending into the sylvian fissure on the right. There is mass effect with mild midline shift toward the left of approximately 7 mm. Elsewhere gray-white compartments normal. Mild paranasal sinus disease in the maxillary antra bilaterally. Volume of hemorrhage in the right basal ganglia region is approximately 26 cubic cm. Note that there is additional hemorrhage in the subarachnoid space on the right which cannot be easily quantified with respect to specific volume of hemorrhage. Critical Value/emergent results were called by telephone at the time of interpretation on 04/01/2016 at 3:53 pm to Dr. Gareth Morgan , who verbally acknowledged these results. This report was also communicated as a critical value emergently to Dr. Silverio Decamp, neurology. Electronically Signed   By: Lowella Grip III M.D.   On: 04/01/2016 15:54    DISCUSSION: 45 year old female with hypertensive ICH. Intubated for airway protection. On clevidipine for BP control.  ASSESSMENT / PLAN:  NEUROLOGIC A:   R basal ganglia ICH with midline shift SAH  P:   RASS goal: -1 Neurology primary Neurosurgery consultation  PULMONARY A: At risk inability to protect airway- hlding own  P:   Monitor for intubation risk  CARDIOVASCULAR A:  Hypertensive emergency  P:  Telemetry SBP goal 120-140mmHg per nuero Clevidipine infusion for SBP goal per neuro PRN hydralazine Holding home felodipine, lasix, nebivolol   RENAL A:   Nil acute  P:   Supp K Follow BMP, mag, phos  GASTROINTESTINAL A:   No acute issues  P:   Pepcid for SUP NPO  HEMATOLOGIC A:   No acute issues  P:  Follow CBC Check coags  INFECTIOUS A:   No acute issues  P:     ENDOCRINE A:   No acute issues  P:   monitor     FAMILY  - Updates: husband updated 04/02/16  - Inter-disciplinary family meet or Palliative Care meeting due by:   4/26  PCCM stays on b0ard - high risk for re-bleed. If doing well 04/03/16 - no need to see    Dr. Brand Males, M.D., [facility].C.P Pulmonary and Critical Care Medicine Staff Physician [facility] Pager: [phone], If no answer or between  15:00h - 7:00h: call [phone]  04/02/2016 9:41 AM

## 2016-04-02 NOTE — Evaluation (Signed)
Clinical/Bedside Swallow Evaluation Patient Details  Name: Abigail Campos MRN: [id number] Date of Birth: [birth year]  Today's Date: 04/02/2016 Time: SLP Start Time (ACUTE ONLY): [id number] SLP Stop Time (ACUTE ONLY): [id number] SLP Time Calculation (min) (ACUTE ONLY): 25 min  Past Medical History:  Past Medical History  Diagnosis Date  . Headache(784.0)     developed migraines after 2nd pregnancy, had a negative CT of the head  . Hypertension   . Essential hypertension 10/19/2007    03-2010: metoprolol changed to bystolic (was not feeling well on it, no specific allergy or reaction)     Past Surgical History:  Past Surgical History  Procedure Laterality Date  . Inguinal hernia repair      2004  . Cesarean section      [id number]  . Abdominal hysterectomy  11-14-07    no oophorectomy per surgical report   HPI:  45 y.o. female with h/o migraines and HTN, who presented to ED via EMS after peers observed acute onset of L facial asymmetry, R gaze and unresponsiveness. Pt had c/o headache throughout the day. CT Head 4/19 intraparenchymal hemorrhage arising from posterior, lateral R basal ganglia with subarachnoid hemorrhage extending into the sylvian fissure on R. There is mass effect with mild midline shift toward L of approximately 7 mm. Volume of hemorrhage in R basal ganglia resion is ~26 cubic cm. Note that there is additional hemorrhage in subarachnoid space on R which cannot be easily quantified with respect to specific volume of hemorrhage. No prior h/o SLP intervention.   Assessment / Plan / Recommendation Clinical Impression  Pt lethargic/drowsy upon SLP arrival and required max verbal and tactile assist to remain attentive throughout assessment. Pt exhibited immediate throat clear x1 during big sips of thin liquids, indicative of possible penetration/aspiration. Further throat clearing episodes were prevented when SLP provided mod verbal and tactile cues to take small sips. Due to L side  facial asymmetry, reduced labial seal and subsequent L anterior spillage also observed. Husband [name] educated re: continued NPO status until alertness improves. SLP will f/u next date to determine PO readiness. Defer speech-language evaluation at this time until pt fully alert.    Aspiration Risk  Moderate aspiration risk    Diet Recommendation NPO   Medication Administration: Via alternative means    Other  Recommendations Oral Care Recommendations: Oral care QID Other Recommendations: Have oral suction available   Follow up Recommendations   (TBD)    Frequency and Duration min 2x/week  2 weeks       Prognosis Prognosis for Safe Diet Advancement: Good      Swallow Study   General HPI: 45 y.o. female with h/o migraines and HTN, who presented to ED via EMS after peers observed acute onset of L facial asymmetry, R gaze and unresponsiveness. Pt had c/o headache throughout the day. CT Head 4/19 intraparenchymal hemorrhage arising from posterior, lateral R basal ganglia with subarachnoid hemorrhage extending into the sylvian fissure on R. There is mass effect with mild midline shift toward L of approximately 7 mm. Volume of hemorrhage in R basal ganglia resion is ~26 cubic cm. Note that there is additional hemorrhage in subarachnoid space on R which cannot be easily quantified with respect to specific volume of hemorrhage. No prior h/o SLP intervention. Type of Study: Bedside Swallow Evaluation Previous Swallow Assessment: see HPI Diet Prior to this Study: NPO Temperature Spikes Noted: No Respiratory Status: Room air History of Recent Intubation: No Behavior/Cognition: Lethargic/Drowsy;Requires cueing;Cooperative Oral Cavity Assessment: Within  Functional Limits Oral Care Completed by SLP: No Oral Cavity - Dentition: Adequate natural dentition;Poor condition Vision: Functional for self-feeding Self-Feeding Abilities: Able to feed self;Needs set up Patient Positioning: Upright in  bed Baseline Vocal Quality: Low vocal intensity (possible dysarthria) Volitional Cough: Strong Volitional Swallow: Able to elicit    Oral/Motor/Sensory Function Overall Oral Motor/Sensory Function: Severe impairment Facial ROM: Reduced left Facial Symmetry: Abnormal symmetry left Facial Strength: Reduced left Facial Sensation: Within Functional Limits Lingual ROM: Within Functional Limits Lingual Symmetry: Within Functional Limits Lingual Strength: Within Functional Limits Lingual Sensation: Within Functional Limits Velum: Within Functional Limits Mandible: Within Functional Limits   Ice Chips Ice chips: Not tested   Thin Liquid Thin Liquid: Impaired Presentation: Cup;Self Fed Oral Phase Impairments: Reduced labial seal Oral Phase Functional Implications: Left anterior spillage Pharyngeal  Phase Impairments: Throat Clearing - Immediate    Nectar Thick Nectar Thick Liquid: Not tested   Honey Thick Honey Thick Liquid: Not tested   Puree Puree: Within functional limits Presentation: Self Fed;Spoon   Solid        Kawehi Hostetter, Student-SLP Solid: Impaired Presentation: Self Fed Oral Phase Impairments: Reduced labial seal Oral Phase Functional Implications: Left anterior spillage Pharyngeal Phase Impairments:  (none)        Phillip Sandler 04/02/2016,10:25 AM

## 2016-04-02 NOTE — Progress Notes (Signed)
OT Cancellation Note  Patient Details Name: Abigail Campos MRN: [id number] DOB: 05/30/1971   Cancelled Treatment:    Reason Eval/Treat Not Completed: Patient not medically ready;Other (comment). Active bedrest orders. Please update activity orders to initiate therapy. Thanks  [place], OTR/L  [phone] 04/02/2016 04/02/2016, 2:36 PM

## 2016-04-02 NOTE — Progress Notes (Addendum)
STROKE TEAM PROGRESS NOTE   HISTORY OF PRESENT ILLNESS Abigail Campos is an 45 y.o. female with history of HA and HTN. She was at work today and complaining of a HA. At 1500 04/01/2016 she was noted by her peers to have a sudden onset of left facial droop, right gaze and not talking then became unresponsive. EMS arrived and BP was elevated at [id number] systolic. Initial CT head showed a right BG ICH. ICH score 2. Patient was not administered IV t-PA secondary to [place]. She was admitted to the neuro ICU for further evaluation and treatment.   SUBJECTIVE (INTERVAL HISTORY) Her husband is at the bedside.I have personally obtained a history from the patient's husband He recounted her HPI. Patient has no prior history of strokes, TIAs. She has had difficult to control hypertension  She has been stressed by school, their car situation, etc. They have 2 kids, 65 and 10.I have reviewed the CT scan of the head on admission which shows right basal ganglia hemorrhage 29 cubic cc volume  OBJECTIVE Temp:  [96.8 F (36 C)-98.4 F (36.9 C)] 98.4 F (36.9 C) (04/20 0700) Pulse Rate:  [78-105] 93 (04/20 0700) Cardiac Rhythm:  [-] Normal sinus rhythm (04/20 0400) Resp:  [13-25] 23 (04/20 0600) BP: (119-179)/(70-109) 127/75 mmHg (04/20 0700) SpO2:  [95 %-100 %] 97 % (04/20 0700) Arterial Line BP: (140-167)/(67-91) 166/79 mmHg (04/20 0700) Weight:  [73.5 kg (162 lb 0.6 oz)] 73.5 kg (162 lb 0.6 oz) (04/19 1820)  CBC:   Recent Labs Lab 04/01/16 1533 04/01/16 1541  WBC 9.3  --   NEUTROABS 4.3  --   HGB 13.4 16.0*  HCT 41.6 47.0*  MCV 90.2  --   PLT 304  --     Basic Metabolic Panel:   Recent Labs Lab 04/01/16 1533 04/01/16 1541  NA 136 139  K 3.0* 3.0*  CL 101 99*  CO2 22  --   GLUCOSE 133* 135*  BUN 9 10  CREATININE 0.89 0.80  CALCIUM 9.5  --     Lipid Panel:     Component Value Date/Time   CHOL 174 07/12/2013 0901   TRIG 59.0 07/12/2013 0901   HDL 44.60 07/12/2013 0901   CHOLHDL 4  07/12/2013 0901   VLDL 11.8 07/12/2013 0901   LDLCALC 118* 07/12/2013 0901   HgbA1c:  Lab Results  Component Value Date   HGBA1C 5.3 05/12/2007   Urine Drug Screen:     Component Value Date/Time   LABOPIA NONE DETECTED 04/01/2016 1806   COCAINSCRNUR NONE DETECTED 04/01/2016 1806   LABBENZ NONE DETECTED 04/01/2016 1806   AMPHETMU NONE DETECTED 04/01/2016 1806   THCU NONE DETECTED 04/01/2016 1806   LABBARB NONE DETECTED 04/01/2016 1806      IMAGING  Ct Head Wo Contrast  04/01/2016  CLINICAL DATA:  Headache and sudden loss of consciousness EXAM: CT HEAD WITHOUT CONTRAST TECHNIQUE: Contiguous axial images were obtained from the base of the skull through the vertex without intravenous contrast. COMPARISON:  July 18, 2014 FINDINGS: There is a focal area of hemorrhage arising in the posterior, lateral right basal ganglia region measuring 4.5 x 3.4 x 3.4 cm with mild surrounding edema. There is subarachnoid hemorrhage near this intraparenchymal hemorrhage with blood filling the right sylvian fissure. There is effacement of the right lateral ventricle with 7 mm of midline shift toward the left. There is no subdural or epidural fluid. Elsewhere gray-white compartments appear normal. There is no hydrocephalus. The bony calvarium appears intact.  The mastoid air cells are clear. There is mucosal thickening in both maxillary antra. No intraorbital lesions are evident. IMPRESSION: Intraparenchymal hemorrhage arising from the posterior, lateral right basal ganglia with subarachnoid hemorrhage extending into the sylvian fissure on the right. There is mass effect with mild midline shift toward the left of approximately 7 mm. Elsewhere gray-white compartments normal. Mild paranasal sinus disease in the maxillary antra bilaterally. Volume of hemorrhage in the right basal ganglia region is approximately 26 cubic cm. Note that there is additional hemorrhage in the subarachnoid space on the right which cannot be  easily quantified with respect to specific volume of hemorrhage. Critical Value/emergent results were called by telephone at the time of interpretation on 04/01/2016 at 3:53 pm to Dr. Gareth Morgan , who verbally acknowledged these results. This report was also communicated as a critical value emergently to Dr. Silverio Decamp, neurology. Electronically Signed   By: Lowella Grip III M.D.   On: 04/01/2016 15:54     PHYSICAL EXAM middle aged african american lady not in distress. . Afebrile. Head is nontraumatic. Neck is supple without bruit.    Cardiac exam no murmur or gallop. Lungs are clear to auscultation. Distal pulses are well felt. Neurological Exam :  Stuporous. Eyes are closed. Response to sternal left with purposeful localization on the right but unable to open eyes. Right gaze deviation but can look to the midline to the left. Pupils 3 mm equal reactive. Fundi were not visualized. Left lower facial weakness. Tongue midline. Follows simple midline and right-sided body commands. Dense left hemiplegia with 0/5 strength.. Left plantar upgoing right is downgoing. Purposeful antigravity movements on the right. ASSESSMENT/PLAN Abigail Campos is a 45 y.o. female with history of HA and HTN presenting with severe HA and L sided weakness. CT showed a R BG hemorrhage.   Stroke:  right basal ganglia hemorrhage with SAH and cerebral edema most likely secondary to hypertensive emergency. Angio to rule out other source  Resultant  Somnolent state, L hemiparesis  CT head  right basal ganglia hemorrhage with SAH with mass effect and 31mm midline shift, volume 26 cubic cm  CTA head & neck pending   2D Echo  pending   SCDs for VTE prophylaxis Diet NPO time specified  No antithrombotic prior to admission  Ongoing aggressive stroke risk factor management  Therapy recommendations:  .sb0p  (lives with husband, 2 kids 27 & 68, works at a daycare)  Disposition:  pending   Hypertensive  Emergency  SBP >200 per EMS in setting of neurologic symptoms. 179/109 on admission.  Initially started on nicardipine with poor control, changed to Cleviprex with better control  Hx difficulty controlling BP per husband  SBP goal < 160   Now Stable 120-130s   Other Stroke Risk Factors  Obesity, Body mass index is 35.06 kg/(m^2).   Family hx stroke (maternal grandfather)  Hospital day # [place] for Pager information 04/02/2016 9:18 AM  I have personally examined this patient, reviewed notes, independently viewed imaging studies, participated in medical decision making and plan of care. I have made any additions or clarifications directly to the above note. Agree with note above. She presented with sudden onset of left hemiplegia and decreased responsiveness due to right basal ganglia hemorrhage with cytotoxic edema and is at significant risk for hematoma expansion, cerebral edema, brain herniation, neurological worsening and death. She needs close neurological monitoring, tight blood pressure control. Keep systolic blood pressure  goal below 160 for 24 hours and then below 180. Check CT angiogram of the brain today to look for hematoma expansion as well as intracranial stenosis/aneurysm.Patient may need intubation and hypertonic saline if  There is further neurological decline and increase in cerebral edema.I had a long 20 minute discussion on the bedside with the patient's husband regarding her neurological status, prognosis, plan for evaluation and answered questions. I also discussed her care with Dr. Elsworth Soho This patient is critically ill and at significant risk of neurological worsening, death and care requires constant monitoring of vital signs, hemodynamics,respiratory and cardiac monitoring, extensive review of multiple databases, frequent neurological assessment, discussion with family, other specialists and medical decision making of high  complexity.I have made any additions or clarifications directly to the above note.This critical care time does not reflect procedure time, or teaching time or supervisory time of PA/NP/Med Resident etc but could involve care discussion time.  I spent 50 minutes of neurocritical care time  in the care of  this patient.     Antony Contras, MD Medical Director [place] Pager: [phone] 04/02/2016 3:21 PM    To contact Stroke Continuity provider, please refer to [email or web address] After hours, contact General Neurology

## 2016-04-02 NOTE — Progress Notes (Signed)
PT Cancellation Note  Patient Details Name: Abigail Campos MRN: [id number] DOB: 1971-02-02   Cancelled Treatment:    Reason Eval/Treat Not Completed: Patient not medically ready (active bedrest orders will check back)   Duncan Dull 04/02/2016, 7:46 AM

## 2016-04-03 ENCOUNTER — Inpatient Hospital Stay (HOSPITAL_COMMUNITY): Payer: Managed Care, Other (non HMO)

## 2016-04-03 DIAGNOSIS — I6789 Other cerebrovascular disease: Secondary | ICD-10-CM

## 2016-04-03 DIAGNOSIS — G936 Cerebral edema: Secondary | ICD-10-CM

## 2016-04-03 LAB — ECHOCARDIOGRAM COMPLETE
Height: 57 in
Weight: 2592.61 oz

## 2016-04-03 LAB — PHOSPHORUS: Phosphorus: 2.7 mg/dL (ref 2.5–4.6)

## 2016-04-03 LAB — MAGNESIUM: Magnesium: 1.8 mg/dL (ref 1.7–2.4)

## 2016-04-03 IMAGING — CR DG ABD PORTABLE 1V
1 series · 1 of 1 positions shown · non-contrast
Comparison: [DATE]

CLINICAL DATA: Feeding tube position

EXAM:
PORTABLE ABDOMEN - 1 VIEW

[AP]
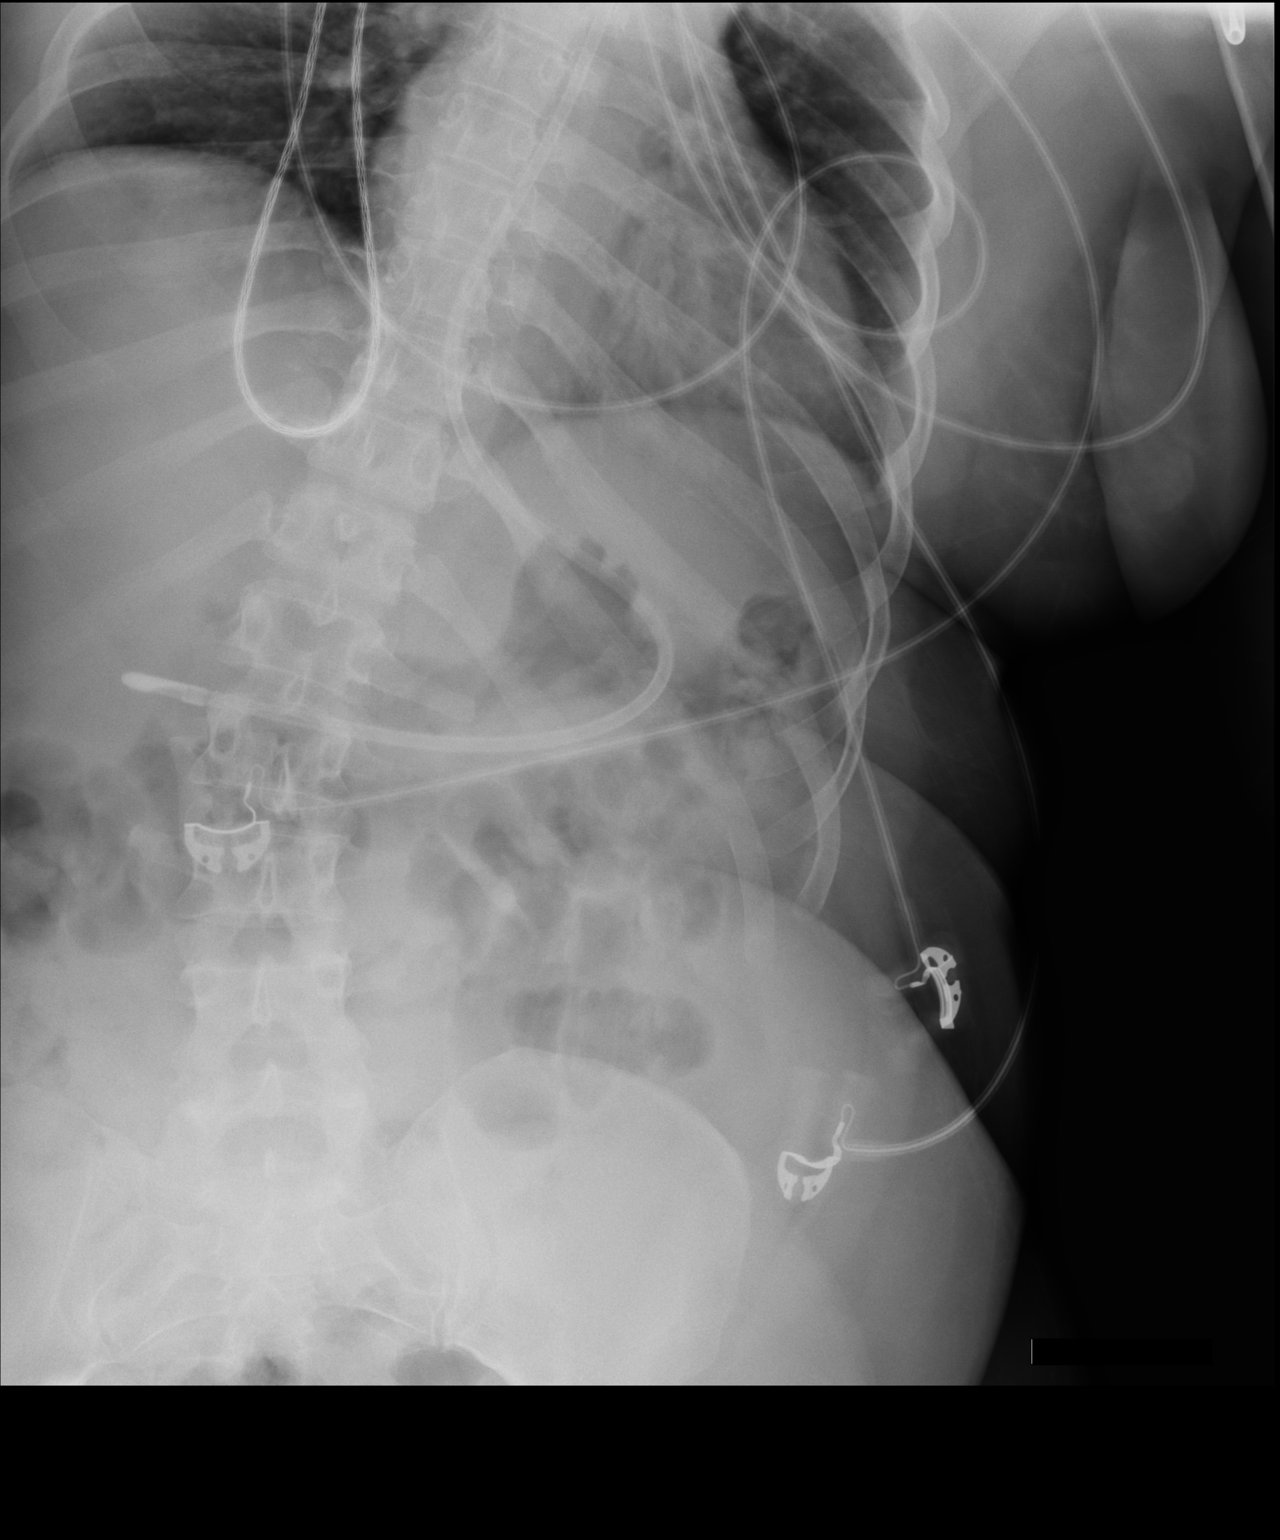

[1 of 1 positions shown; findings below may reference images not displayed]

FINDINGS: Tip of the feeding tube is at the pylorus. No obvious free
intraperitoneal gas. Gas-filled mildly distended small and large
bowel loops are noted.
IMPRESSION: Feeding tube tip is at the pylorus.

## 2016-04-03 IMAGING — CR DG ABD PORTABLE 1V
1 series · 1 of 1 positions shown · non-contrast
Comparison: Radiograph earlier this day at [EN] hour

CLINICAL DATA: Encounter for gastric tube placement.

EXAM:
PORTABLE ABDOMEN - 1 VIEW

[AP]
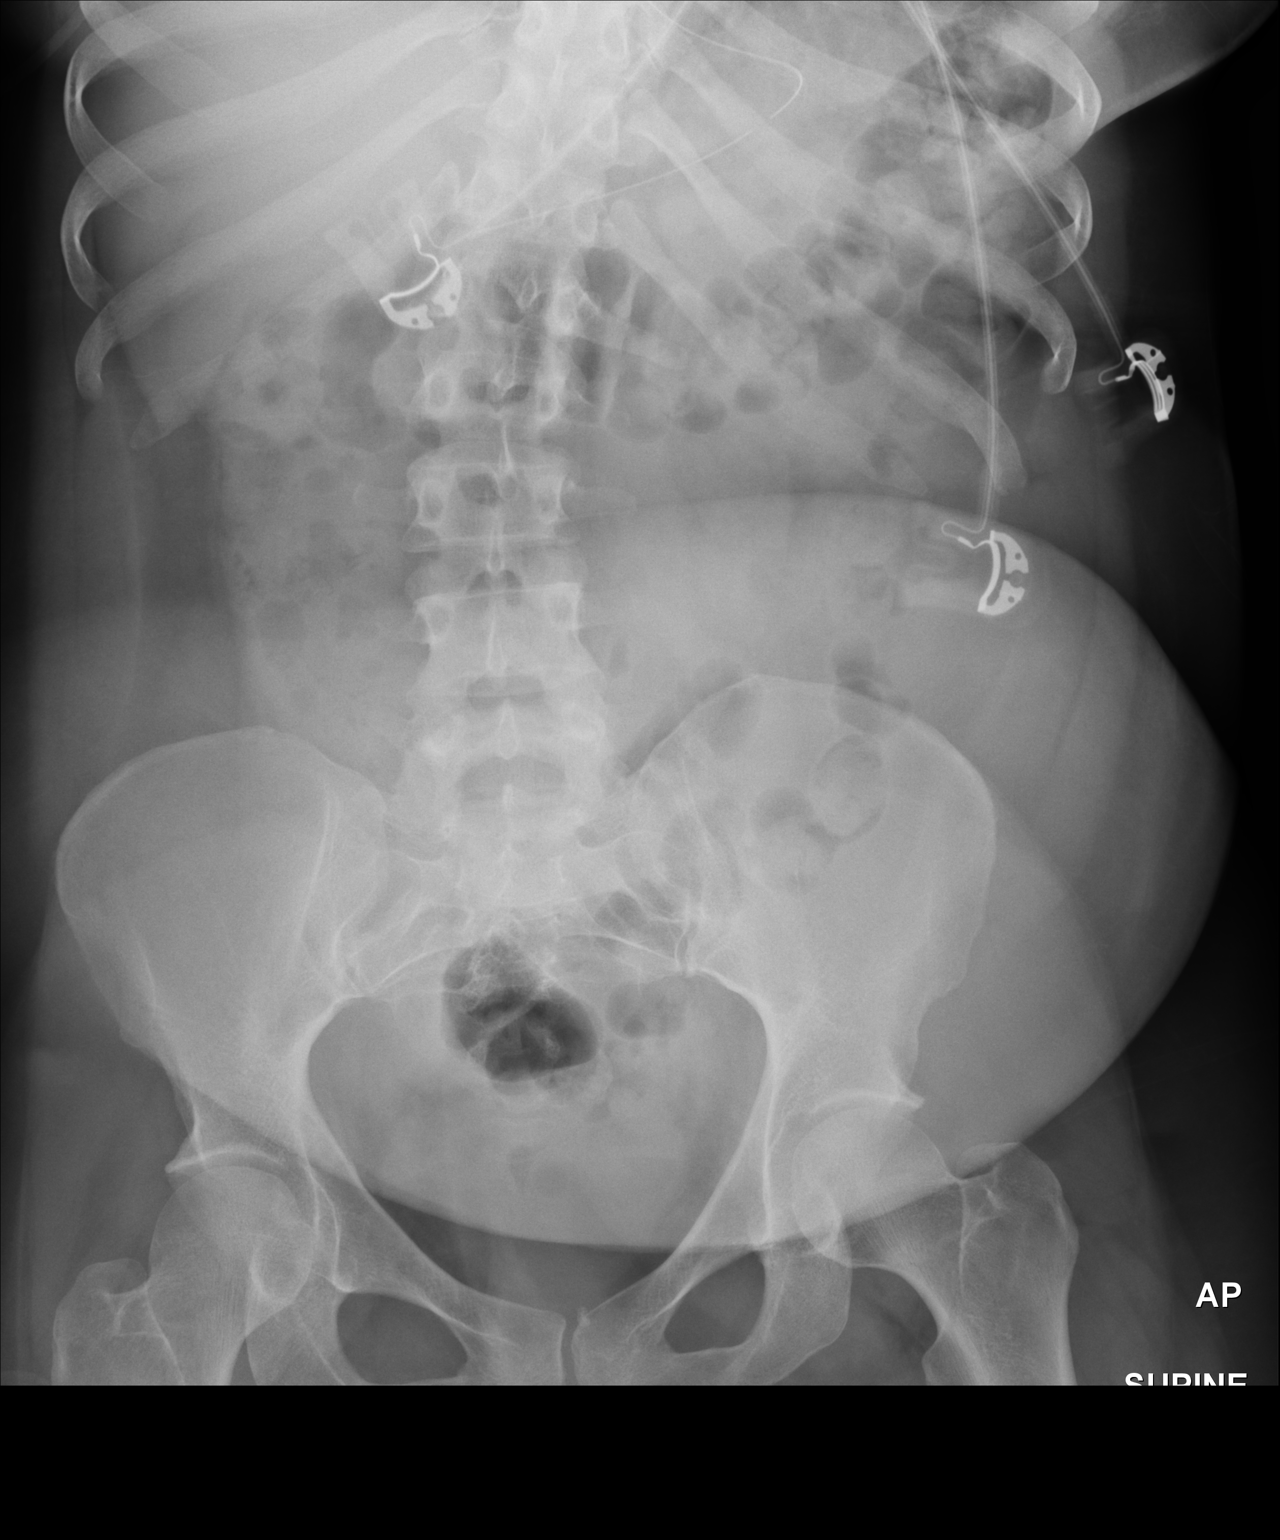

[1 of 1 positions shown; findings below may reference images not displayed]

FINDINGS: Tip and side port of the enteric tube below the diaphragm in the
stomach. There is a normal bowel gas pattern. Monitoring device
projects over the upper abdomen.
IMPRESSION: Tip and side port of the enteric tube below the diaphragm in the
stomach.

## 2016-04-03 MED ORDER — PRO-STAT SUGAR FREE PO LIQD
30.0000 mL | Freq: Two times a day (BID) | ORAL | Status: DC
  Administered 2016-04-03 – 2016-04-06 (×5): 30 mL
  Filled 2016-04-03 (×5): qty 30

## 2016-04-03 MED ORDER — SODIUM CHLORIDE 0.9% FLUSH
10.0000 mL | INTRAVENOUS | Status: DC | PRN
  Administered 2016-04-07: 20 mL
  Filled 2016-04-03: qty 40

## 2016-04-03 MED ORDER — SODIUM CHLORIDE 0.9% FLUSH
10.0000 mL | Freq: Two times a day (BID) | INTRAVENOUS | Status: DC
  Administered 2016-04-03 – 2016-04-04 (×2): 20 mL
  Administered 2016-04-06 – 2016-04-08 (×3): 10 mL
  Administered 2016-04-08: 20 mL

## 2016-04-03 MED ORDER — NEBIVOLOL HCL 10 MG PO TABS
20.0000 mg | ORAL_TABLET | Freq: Two times a day (BID) | ORAL | Status: DC
  Administered 2016-04-03 – 2016-04-08 (×9): 20 mg via ORAL
  Filled 2016-04-03 (×11): qty 2

## 2016-04-03 MED ORDER — SODIUM CHLORIDE 0.9 % IV BOLUS (SEPSIS)
500.0000 mL | Freq: Once | INTRAVENOUS | Status: AC
  Administered 2016-04-03: 500 mL via INTRAVENOUS

## 2016-04-03 MED ORDER — JEVITY 1.2 CAL PO LIQD
1000.0000 mL | ORAL | Status: DC
  Administered 2016-04-03: 16:00:00
  Administered 2016-04-04: 1000 mL
  Administered 2016-04-04: 20:00:00
  Filled 2016-04-03 (×6): qty 1000

## 2016-04-03 MED ORDER — FELODIPINE ER 10 MG PO TB24
10.0000 mg | ORAL_TABLET | Freq: Every day | ORAL | Status: DC
  Administered 2016-04-06 – 2016-04-08 (×3): 10 mg via ORAL
  Filled 2016-04-03 (×6): qty 1

## 2016-04-03 NOTE — Progress Notes (Signed)
  Echocardiogram 2D Echocardiogram has been performed.  Jennette Dubin 04/03/2016, 9:56 AM

## 2016-04-03 NOTE — Evaluation (Addendum)
Speech Language Pathology Evaluation Patient Details Name: TEILA MOCZYGEMBA MRN: [id number] DOB: 12/16/1970 Today's Date: 04/03/2016 Time:  -     Problem List:  Patient Active Problem List   Diagnosis Date Noted  . Encephalopathy acute 04/02/2016  . Stroke (cerebrum) ([place]) 04/01/2016  . ICH (intracerebral hemorrhage) ([place]) 04/01/2016  . Upper airway cough syndrome 12/06/2015  . PCP NOTES >>> 09/25/2015  . Acute bronchitis 11/06/2014  . Need for hepatitis B vaccination 10/19/2014  . Insomnia 12/28/2012  . Annual physical exam 11/23/2011  . Hemorrhoids 10/13/2011  . Morbid obesity ([place]) 02/26/2011  . CHEST PAIN UNSPECIFIED 03/28/2010  . Essential hypertension 10/19/2007  . Headache(784.0) 05/12/2007   Past Medical History:  Past Medical History  Diagnosis Date  . Headache(784.0)     developed migraines after 2nd pregnancy, had a negative CT of the head  . Hypertension   . Essential hypertension 10/19/2007    03-2010: metoprolol changed to bystolic (was not feeling well on it, no specific allergy or reaction)     Past Surgical History:  Past Surgical History  Procedure Laterality Date  . Inguinal hernia repair      2004  . Cesarean section      [id number]  . Abdominal hysterectomy  11-14-07    no oophorectomy per surgical report   HPI:  45 y.o. female with h/o migraines and HTN, who presented to ED via EMS after peers observed acute onset of L facial asymmetry, R gaze and unresponsiveness. Pt had c/o headache throughout the day. CT Head 4/19 intraparenchymal hemorrhage arising from posterior, lateral R basal ganglia with subarachnoid hemorrhage extending into the sylvian fissure on R. There is mass effect with mild midline shift toward L of approximately 7 mm. Volume of hemorrhage in R basal ganglia resion is ~26 cubic cm. Note that there is additional hemorrhage in subarachnoid space on R which cannot be easily quantified with respect to specific volume of hemorrhage. No  prior h/o SLP intervention.   Assessment / Plan / Recommendation Clinical Impression  Pt seen yesterday and today for assessments. Her lethary persists with decreased ability to sustain attention to speaker for greater than 3-4 seconds. Followed several simple one step commands requiring repetition and visual. No spontaneous verbalizations but imitated single word x 1. Pt's lethargy is currently having the greatest impact on her functional communication and cognition. Of note pt is left handed (will continue to assess language abilities in treatment). ST will continue to gain knowledge of abilities through diagnostic treatement and treat as appropriate.       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)   Cognition  Overall Cognitive Status: Impaired/Different from baseline Arousal/Alertness: Lethargic Orientation Level:  (unable to respond to y/n d/t poor attentin/lethargy) Attention: Sustained Focused Attention: Appears intact Sustained Attention: Impaired Sustained Attention Impairment: Verbal basic;Functional basic Memory:  (to be assessed) Awareness: Impaired Awareness Impairment: Emergent impairment Problem Solving: Impaired Problem Solving Impairment: Functional basic Behaviors:  (suspect impulsivity once alertness improves) Safety/Judgment: Impaired    Comprehension  Auditory Comprehension Overall Auditory Comprehension: Impaired Yes/No Questions:  (no response today) Commands: Impaired One Step Basic Commands: 0-24% accurate (needed visual and  verbal cue) Interfering Components: Attention;Processing speed Visual  Recognition/Discrimination Discrimination: Not tested Reading Comprehension Reading Status: Not tested    Expression Expression Primary Mode of Expression:  (mostly nonverbal at present) Verbal Expression Overall Verbal Expression: Impaired Initiation: Impaired Repetition:  (repeated vocalization x 1) Naming:  (will assess) Pragmatics: Impairment Impairments: Abnormal affect;Eye contact Interfering Components: Attention Written Expression Dominant Hand: Left Written Expression: Unable to assess (comment)   Oral / Motor  Oral Motor/Sensory Function Overall Oral Motor/Sensory Function: Severe impairment Facial ROM: Reduced left Facial Symmetry: Abnormal symmetry left Facial Strength: Reduced left Facial Sensation: Within Functional Limits Lingual ROM: Within Functional Limits Lingual Symmetry: Within Functional Limits Lingual Strength: Within Functional Limits Lingual Sensation: Within Functional Limits Velum: Within Functional Limits Mandible: Within Functional Limits Motor Speech Overall Motor Speech: Impaired Respiration: Within functional limits Phonation: Normal (vocalization x 1) Resonance: Within functional limits Articulation:  (continue to assess) Intelligibility: Intelligible (during one vocalization) Motor Planning:  (continue to assess)   GO                    Houston Siren 04/03/2016, 3:55 PM  Orbie Pyo Colvin Caroli.[employer] [phone]

## 2016-04-03 NOTE — Progress Notes (Signed)
STROKE TEAM PROGRESS NOTE   HISTORY OF PRESENT ILLNESS Abigail Campos is an 45 y.o. female with history of HA and HTN. She was at work today and complaining of a HA. At 1500 04/01/2016 she was noted by her peers to have a sudden onset of left facial droop, right gaze and not talking then became unresponsive. EMS arrived and BP was elevated at [id number] systolic. Initial CT head showed a right BG ICH. ICH score 2. Patient was not administered IV t-PA secondary to [place]. She was admitted to the neuro ICU for further evaluation and treatment.   SUBJECTIVE (INTERVAL HISTORY) Her husband is at the bedside. Her blood pressure is adequately controlled. She remains sleepy but arousable and follows occasional commands. I personally reviewed CT angiogram of the brain done yesterday which shows no significant intracranial stenosis or aneurysm and stable appearance of hemorrhage with cytotoxic edema. OBJECTIVE Temp:  [98.4 F (36.9 C)-100 F (37.8 C)] 100 F (37.8 C) (04/21 1200) Pulse Rate:  [64-102] 68 (04/21 1230) Cardiac Rhythm:  [-] Normal sinus rhythm (04/21 0753) Resp:  [17-29] 23 (04/21 1230) BP: (89-183)/(47-124) 138/68 mmHg (04/21 1230) SpO2:  [94 %-100 %] 100 % (04/21 1230)  CBC:   Recent Labs Lab 04/01/16 1533 04/01/16 1541  WBC 9.3  --   NEUTROABS 4.3  --   HGB 13.4 16.0*  HCT 41.6 47.0*  MCV 90.2  --   PLT 304  --     Basic Metabolic Panel:   Recent Labs Lab 04/01/16 1533 04/01/16 1541 04/03/16 0340  NA 136 139  --   K 3.0* 3.0*  --   CL 101 99*  --   CO2 22  --   --   GLUCOSE 133* 135*  --   BUN 9 10  --   CREATININE 0.89 0.80  --   CALCIUM 9.5  --   --   MG  --   --  1.8  PHOS  --   --  2.7    Lipid Panel:     Component Value Date/Time   CHOL 174 07/12/2013 0901   TRIG 76 04/02/2016 0937   HDL 44.60 07/12/2013 0901   CHOLHDL 4 07/12/2013 0901   VLDL 11.8 07/12/2013 0901   LDLCALC 118* 07/12/2013 0901   HgbA1c:  Lab Results  Component Value Date    HGBA1C 5.3 05/12/2007   Urine Drug Screen:     Component Value Date/Time   LABOPIA NONE DETECTED 04/01/2016 1806   COCAINSCRNUR NONE DETECTED 04/01/2016 1806   LABBENZ NONE DETECTED 04/01/2016 1806   AMPHETMU NONE DETECTED 04/01/2016 1806   THCU NONE DETECTED 04/01/2016 1806   LABBARB NONE DETECTED 04/01/2016 1806      IMAGING  Ct Angio Head W/cm &/or Wo Cm  04/02/2016  CLINICAL DATA:  45 year old female with right basal ganglia region acute intracranial hemorrhage diagnosed yesterday following headache and sudden loss of consciousness. Initial encounter. EXAM: CT ANGIOGRAPHY HEAD AND NECK TECHNIQUE: Multidetector CT imaging of the head and neck was performed using the standard protocol during bolus administration of intravenous contrast. Multiplanar CT image reconstructions and MIPs were obtained to evaluate the vascular anatomy. Carotid stenosis measurements (when applicable) are obtained utilizing NASCET criteria, using the distal internal carotid diameter as the denominator. CONTRAST:  80 mL Isovue 370. COMPARISON:  Head CT 04/01/2016 and earlier. Noncontrast head CT from 958 hours today, reported separately FINDINGS: CTA NECK Skeleton: Poor dentition. No acute osseous abnormality identified. Visualized paranasal sinuses and mastoids  are clear. Other neck: Negative lung apices. No superior mediastinal lymphadenopathy. Thyroid, larynx, pharynx, parapharyngeal spaces, retropharyngeal space, sublingual space, submandibular glands and parotid glands are within normal limits. No cervical lymphadenopathy. Aortic arch: 3 vessel arch configuration. No arch atherosclerosis or great vessel origin stenosis. Right carotid system: Negative. Left carotid system: Mildly tortuous left CCA. Mildly tortuous cervical left ICA. Otherwise negative. Vertebral arteries:No proximal subclavian artery stenosis. Normal vertebral artery origins. Tortuous left V1 segment. Otherwise negative to the skullbase. CTA HEAD  Posterior circulation: Negative distal vertebral arteries. Normal PICA origins and vertebrobasilar junction. Normal AICA origins, basilar artery, SCA origins and PCA origins. Left posterior communicating artery is diminutive, the right is smaller or absent. Bilateral PCA branches are within normal limits. Anterior circulation: Mild left ICA siphon calcified plaque without stenosis. Both siphons are patent without stenosis. Normal ophthalmic and left posterior communicating artery origins. Normal carotid termini. Normal MCA and ACA origins. Anterior communicating artery and bilateral ACA branches are within normal limits. Left MCA M1 segment, bifurcation, and left MCA branches are within normal limits. Right MCA M1 segment, bifurcation, and right MCA branches are normal aside from mild mass effect on the posterior right MCA division related to the intra-axial hemorrhage. No CTA spot sign is associated. No evidence of abnormal venous drainage or vascular malformation. Venous sinuses: Patent. Anatomic variants: None. Delayed phase: Hyperdense intra-axial hemorrhage with epicenter at the right basal ganglia and right frontal lobe white matter today measures 27 x 36 by 43 mm (AP by transverse by CC) for an estimated blood volume of 21 mL. Surrounding hypodense edema has mildly increased since yesterday, but overall blood volume has not significantly changed. Mild regional mass effect has not significantly changed. Stable gray-white matter differentiation elsewhere. IMPRESSION: 1. Negative arterial findings on CTA head and neck, aside from mild calcified atherosclerosis of the left ICA siphon. 2. Stable hyperdense intra-axial hemorrhage in the right hemisphere since yesterday. Negative CTA Spot Sign, and no evidence of associated vascular malformation. Estimated blood volume of 21 mL. Mildly increased surrounding edema, but stable mild regional mass effect. 3. Poor dentition. Electronically Signed   By: Genevie Ann M.D.   On:  04/02/2016 10:34   Ct Head Wo Contrast  04/02/2016  CLINICAL DATA:  Cerebral hemorrhage. Continued surveillance. High blood pressure. LEFT-sided weakness. EXAM: CT HEAD WITHOUT CONTRAST TECHNIQUE: Contiguous axial images were obtained from the base of the skull through the vertex without intravenous contrast. COMPARISON:  CT head 04/01/2016.  CT angio head and neck is ordered. FINDINGS: Redemonstrated is a 29 x 23 x 40 mm RIGHT cerebral hemisphere hemorrhage, approximately 13 mL volume, favored to represent a hypertensive related hemorrhage affecting the lentiform nucleus. Trace subarachnoid blood extends to the sylvian fissure. This likely has resulted from extension of the hematoma through the cortex, but CTA head neck should be performed to exclude a cerebral aneurysm. Compared with yesterday's exam, a similar appearance is noted. Measurements are slightly smaller in that the core hemorrhage is measured today rather than the core + surrounding edema. IMPRESSION: Stable RIGHT putaminal intracerebral hemorrhage, favored to represent a hypertensive related event. Electronically Signed   By: Staci Righter M.D.   On: 04/02/2016 10:26   Ct Head Wo Contrast  04/01/2016  CLINICAL DATA:  Headache and sudden loss of consciousness EXAM: CT HEAD WITHOUT CONTRAST TECHNIQUE: Contiguous axial images were obtained from the base of the skull through the vertex without intravenous contrast. COMPARISON:  July 18, 2014 FINDINGS: There is a focal area  of hemorrhage arising in the posterior, lateral right basal ganglia region measuring 4.5 x 3.4 x 3.4 cm with mild surrounding edema. There is subarachnoid hemorrhage near this intraparenchymal hemorrhage with blood filling the right sylvian fissure. There is effacement of the right lateral ventricle with 7 mm of midline shift toward the left. There is no subdural or epidural fluid. Elsewhere gray-white compartments appear normal. There is no hydrocephalus. The bony calvarium  appears intact. The mastoid air cells are clear. There is mucosal thickening in both maxillary antra. No intraorbital lesions are evident. IMPRESSION: Intraparenchymal hemorrhage arising from the posterior, lateral right basal ganglia with subarachnoid hemorrhage extending into the sylvian fissure on the right. There is mass effect with mild midline shift toward the left of approximately 7 mm. Elsewhere gray-white compartments normal. Mild paranasal sinus disease in the maxillary antra bilaterally. Volume of hemorrhage in the right basal ganglia region is approximately 26 cubic cm. Note that there is additional hemorrhage in the subarachnoid space on the right which cannot be easily quantified with respect to specific volume of hemorrhage. Critical Value/emergent results were called by telephone at the time of interpretation on 04/01/2016 at 3:53 pm to Dr. Gareth Morgan , who verbally acknowledged these results. This report was also communicated as a critical value emergently to Dr. Silverio Decamp, neurology. Electronically Signed   By: Lowella Grip III M.D.   On: 04/01/2016 15:54   Ct Angio Neck W/cm &/or Wo/cm  04/02/2016  CLINICAL DATA:  45 year old female with right basal ganglia region acute intracranial hemorrhage diagnosed yesterday following headache and sudden loss of consciousness. Initial encounter. EXAM: CT ANGIOGRAPHY HEAD AND NECK TECHNIQUE: Multidetector CT imaging of the head and neck was performed using the standard protocol during bolus administration of intravenous contrast. Multiplanar CT image reconstructions and MIPs were obtained to evaluate the vascular anatomy. Carotid stenosis measurements (when applicable) are obtained utilizing NASCET criteria, using the distal internal carotid diameter as the denominator. CONTRAST:  80 mL Isovue 370. COMPARISON:  Head CT 04/01/2016 and earlier. Noncontrast head CT from 958 hours today, reported separately FINDINGS: CTA NECK Skeleton: Poor dentition.  No acute osseous abnormality identified. Visualized paranasal sinuses and mastoids are clear. Other neck: Negative lung apices. No superior mediastinal lymphadenopathy. Thyroid, larynx, pharynx, parapharyngeal spaces, retropharyngeal space, sublingual space, submandibular glands and parotid glands are within normal limits. No cervical lymphadenopathy. Aortic arch: 3 vessel arch configuration. No arch atherosclerosis or great vessel origin stenosis. Right carotid system: Negative. Left carotid system: Mildly tortuous left CCA. Mildly tortuous cervical left ICA. Otherwise negative. Vertebral arteries:No proximal subclavian artery stenosis. Normal vertebral artery origins. Tortuous left V1 segment. Otherwise negative to the skullbase. CTA HEAD Posterior circulation: Negative distal vertebral arteries. Normal PICA origins and vertebrobasilar junction. Normal AICA origins, basilar artery, SCA origins and PCA origins. Left posterior communicating artery is diminutive, the right is smaller or absent. Bilateral PCA branches are within normal limits. Anterior circulation: Mild left ICA siphon calcified plaque without stenosis. Both siphons are patent without stenosis. Normal ophthalmic and left posterior communicating artery origins. Normal carotid termini. Normal MCA and ACA origins. Anterior communicating artery and bilateral ACA branches are within normal limits. Left MCA M1 segment, bifurcation, and left MCA branches are within normal limits. Right MCA M1 segment, bifurcation, and right MCA branches are normal aside from mild mass effect on the posterior right MCA division related to the intra-axial hemorrhage. No CTA spot sign is associated. No evidence of abnormal venous drainage or vascular malformation. Venous sinuses: Patent.  Anatomic variants: None. Delayed phase: Hyperdense intra-axial hemorrhage with epicenter at the right basal ganglia and right frontal lobe white matter today measures 27 x 36 by 43 mm (AP by  transverse by CC) for an estimated blood volume of 21 mL. Surrounding hypodense edema has mildly increased since yesterday, but overall blood volume has not significantly changed. Mild regional mass effect has not significantly changed. Stable gray-white matter differentiation elsewhere. IMPRESSION: 1. Negative arterial findings on CTA head and neck, aside from mild calcified atherosclerosis of the left ICA siphon. 2. Stable hyperdense intra-axial hemorrhage in the right hemisphere since yesterday. Negative CTA Spot Sign, and no evidence of associated vascular malformation. Estimated blood volume of 21 mL. Mildly increased surrounding edema, but stable mild regional mass effect. 3. Poor dentition. Electronically Signed   By: Genevie Ann M.D.   On: 04/02/2016 10:34     PHYSICAL EXAM middle aged african american lady not in distress. . Afebrile. Head is nontraumatic. Neck is supple without bruit.    Cardiac exam no murmur or gallop. Lungs are clear to auscultation. Distal pulses are well felt. Neurological Exam :  Stuporous. Eyes are closed. Response to sternal left with purposeful localization on the right but unable to open eyes. Right gaze deviation but can look to the midline to the left. Pupils 3 mm equal reactive. Fundi were not visualized. Left lower facial weakness. Tongue midline. Follows simple midline and right-sided body commands. Dense left hemiplegia with 0/5 strength.. Left plantar upgoing right is downgoing. Purposeful antigravity movements on the right. ASSESSMENT/PLAN Ms. JAI OVERFIELD is a 45 y.o. female with history of HA and HTN presenting with severe HA and L sided weakness. CT showed a R BG hemorrhage.   Stroke:  right basal ganglia hemorrhage with SAH and cerebral edema most likely secondary to hypertensive emergency. Angio to rule out other source  Resultant  Somnolent state, L hemiparesis  CT head  right basal ganglia hemorrhage with SAH with mass effect and 55mm midline shift,  volume 26 cubic cm  CTA head & neck pending   2D Echo  Done results pending   SCDs for VTE prophylaxis Diet NPO time specified  No antithrombotic prior to admission  Ongoing aggressive stroke risk factor management  Therapy recommendations: pending  Disposition:  pending   Hypertensive Emergency  SBP >200 per EMS in setting of neurologic symptoms. 179/109 on admission.  Initially started on nicardipine with poor control, changed to Cleviprex with better control  Hx difficulty controlling BP per husband  SBP goal < 160   Now Stable 120-130s   Other Stroke Risk Factors  Obesity, Body mass index is 35.06 kg/(m^2).   Family hx stroke (maternal grandfather)  Hospital day # Fidelity Gobles for Pager information 04/03/2016 12:50 PM  I have personally examined this patient, reviewed notes, independently viewed imaging studies, participated in medical decision making and plan of care. I have made any additions or clarifications directly to the above note. Agree with note above. She presented with sudden onset of left hemiplegia and decreased responsiveness due to right basal ganglia hemorrhage with cytotoxic edema and is at significant risk for hematoma expansion, cerebral edema, brain herniation, neurological worsening and death. She needs close neurological monitoring, tight blood pressure control. Keep systolic blood pressure goal below  180.  Patient may need intubation and hypertonic saline if  There is further neurological decline and increase in cerebral edema.I had a long 20 minute discussion  on the bedside with the patient's husband regarding her neurological status, prognosis, plan for evaluation and answered questions. I also discussed her care with Dr. Chase Caller.Plan panda tube for feeds and start medicines via it. Continue prn labetalol and hydarlazine This patient is critically ill and at significant risk of neurological worsening,  death and care requires constant monitoring of vital signs, hemodynamics,respiratory and cardiac monitoring, extensive review of multiple databases, frequent neurological assessment, discussion with family, other specialists and medical decision making of high complexity.I have made any additions or clarifications directly to the above note.This critical care time does not reflect procedure time, or teaching time or supervisory time of PA/NP/Med Resident etc but could involve care discussion time.  I spent 30 minutes of neurocritical care time  in the care of  this patient.     Antony Contras, MD Medical Director [place] Pager: [phone] 04/03/2016 12:50 PM    To contact Stroke Continuity provider, please refer to [email or web address] After hours, contact General Neurology

## 2016-04-03 NOTE — Evaluation (Signed)
Physical Therapy Evaluation Patient Details Name: Abigail Campos MRN: [id number] DOB: 1971/03/28 Today's Date: 04/03/2016   History of Present Illness  45 y.o. female admitted to [facility] on 04/01/16 with HA, left facial droop, right gaze, and not talking eventually becoming unresponsive.  Pt dx with BG ICH.  Pt with significant PMHx of HTN, HA.    Clinical Impression  Pt sat EOB for >10 mins working on finding midline and following basic commands (as well as sitting balance).  She seems to have dense left hemiplegia and left neglect.  Family is very supportive.  She would be an excellent CIR candidate and will need extensive multidisciplinary therapy before returning home.   PT to follow acutely for deficits listed below.       Follow Up Recommendations CIR    Equipment Recommendations  Wheelchair (measurements PT);Wheelchair cushion (measurements PT);3in1 (PT);Hospital bed    Recommendations for Other Services Rehab consult     Precautions / Restrictions Precautions Precautions: Fall Precaution Comments: decreased awareness and dense left hemi      Mobility  Bed Mobility Overal bed mobility: Needs Assistance;+ 2 for safety/equipment;+2 for physical assistance Bed Mobility: Supine to Sit;Sit to Supine     Supine to sit: +2 for safety/equipment;+2 for physical assistance;Min assist;Mod assist Sit to supine: Min assist;Mod assist;+2 for physical assistance;+2 for safety/equipment   General bed mobility comments: Pt tends to over shoot her intended direction moving quickly both into and out of bed.  She fluctuates between min and mod assist for safety, balance and transitions.   Transfers                 General transfer comment: NT- Dr. Leonie Man only approved EOB today.       Modified Rankin (Stroke Patients Only) Modified Rankin (Stroke Patients Only) Pre-Morbid Rankin Score: No symptoms Modified Rankin: Severe disability     Balance Overall balance assessment:  Needs assistance Sitting-balance support: Feet supported;Single extremity supported Sitting balance-Leahy Scale: Poor Sitting balance - Comments: up to max assist EOB, this also fluctuating throughtout session.  Pt can push at times and also tries to support herself by proping on right hand.                                       Pertinent Vitals/Pain Pain Assessment: Faces Faces Pain Scale: Hurts even more Pain Location: head? difficult to assess, pt grabbing head with right hand at times.  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     Hand Dominance   Dominant Hand: Left    Extremity/Trunk Assessment   Upper Extremity Assessment: Defer to OT evaluation           Lower Extremity Assessment: LLE deficits/detail   LLE Deficits / Details: dense hemi, no active movement observed, low tone  Cervical / Trunk Assessment: Normal  Communication   Communication: Expressive difficulties  Cognition Arousal/Alertness:  Awake/alert (once EOB) Behavior During Therapy: Restless Overall Cognitive Status: Impaired/Different from baseline Area of Impairment: Attention;Following commands;Safety/judgement;Awareness;Problem solving   Current Attention Level: Focused   Following Commands: Follows one step commands inconsistently;Follows one step commands with increased time Safety/Judgement: Decreased  awareness of safety;Decreased awareness of deficits Awareness: Intellectual Problem Solving: Requires tactile cues;Requires verbal cues;Decreased initiation;Difficulty sequencing;Slow processing General Comments: Pt with Left neglect, decreased awareness of deficits and safety.  focused attention, but easily distracted, especially by things in her right visual field due to left neglect and right gaze preference.              Assessment/Plan    PT Assessment Patient needs continued PT services  PT Diagnosis Difficulty walking;Abnormality of gait;Generalized weakness;Acute pain;Hemiplegia dominant side;Altered mental status   PT Problem List Decreased strength;Decreased activity tolerance;Decreased range of motion;Decreased balance;Decreased mobility;Decreased coordination;Decreased cognition;Decreased knowledge of use of DME;Decreased safety awareness;Decreased knowledge of precautions;Impaired tone;Impaired sensation;Pain  PT Treatment Interventions DME instruction;Gait training;Functional mobility training;Stair training;Therapeutic activities;Balance training;Therapeutic exercise;Neuromuscular re-education;Cognitive remediation;Patient/family education;Wheelchair mobility training   PT Goals (Current goals can be found in the Care Plan section) Acute Rehab PT Goals Patient Stated Goal: unable to state, family to do what they need to do to help her get better.  PT Goal Formulation: With family Time For Goal Achievement: 04/17/16 Potential to Achieve Goals: Good    Frequency Min 4X/week           End of Session   Activity Tolerance: Patient limited by fatigue Patient left: in bed;with call bell/phone within reach;with bed alarm set;with family/visitor present           Time: [id number] PT Time Calculation (min) (ACUTE ONLY): 25 min   Charges:   PT Evaluation $PT Eval Moderate Complexity: 1 Procedure      Kamara Allan B. [place], [place], DPT [phone]   04/03/2016, 2:17  PM

## 2016-04-03 NOTE — Evaluation (Signed)
Occupational Therapy Evaluation Patient Details Name: Abigail Campos MRN: [id number] DOB: 06-May-1971 Today's Date: 04/03/2016    History of Present Illness 45 y.o. female admitted to [facility] on 04/01/16 with HA, left facial droop, right gaze, and not talking eventually becoming unresponsive.  Pt dx with BG ICH.  Pt with significant PMHx of HTN, HA.     Clinical Impression   Pt admitted with above. She demonstrates the below listed deficits and will benefit from continued OT to maximize safety and independence with BADLs.  Pt presents to OT with Lt hemiplegia, Lt neglect, impaired cognition and impaired balance.  She requires max - total A with ADLs at this time.  She was independent PTA, working full time, has two children.  Family is very supportive.  Recommend CIR to allow her achieve max level of independence.       Follow Up Recommendations  CIR    Equipment Recommendations  3 in 1 bedside comode;Tub/shower bench    Recommendations for Other Services Rehab consult     Precautions / Restrictions Precautions Precautions: Fall Precaution Comments: decreased awareness and dense left hemi      Mobility Bed Mobility Overal bed mobility: Needs Assistance;+ 2 for safety/equipment;+2 for physical assistance Bed Mobility: Supine to Sit;Sit to Supine     Supine to sit: +2 for safety/equipment;+2 for physical assistance;Min assist;Mod assist Sit to supine: Min assist;Mod assist;+2 for physical assistance;+2 for safety/equipment   General bed mobility comments: Pt tends to over shoot her intended direction moving quickly both into and out of bed.  She fluctuates between min and mod assist for safety, balance and transitions.   Transfers                 General transfer comment: NT- Dr. Leonie Man only approved EOB today.     Balance Overall balance assessment: Needs assistance Sitting-balance support: Feet supported Sitting balance-Leahy Scale: Poor Sitting balance -  Comments: up to max assist EOB, this also fluctuating throughtout session.  Pt can push at times and also tries to support herself by proping on right hand.                                      ADL Overall ADL's : Needs assistance/impaired Eating/Feeding: NPO   Grooming: Wash/dry hands;Wash/dry face;Oral care;Brushing hair;Total assistance Grooming Details (indicate cue type and reason): Pt was able to demonstrate correct use of toothbrush but did not engage in further ADL activity as she fatigued  Upper Body Bathing: Total assistance;Sitting;Bed level   Lower Body Bathing: Total assistance;Bed level   Upper Body Dressing : Total assistance;Sitting;Bed level   Lower Body Dressing: Total assistance;Bed level   Toilet Transfer: Total assistance Toilet Transfer Details (indicate cue type and reason): unable to attempt this date          Functional mobility during ADLs: Total assistance;+2 for physical assistance (bed mobility )       Vision Additional Comments: Pt unable to participate in formal assessment.  She demonstrates Rt gaze preference.  Max cues to locate family members on her Lt.  Unsure if she has Lt field deficit or [employer] fast streamer Tested?: Yes Perception Deficits: Inattention/neglect Inattention/Neglect: Does not attend to left visual field;Does not attend to left side of body Spatial deficits: will locate sister with mod facilitation and max cues    Praxis Praxis Praxis tested?: Not tested  Praxis-Other Comments: unable to fully assess     Pertinent Vitals/Pain Pain Assessment: Faces Faces Pain Scale: Hurts even more Pain Location: difficult to determine due to impaired cognition and communication deficits  Pain Descriptors / Indicators: Grimacing Pain Intervention(s): Limited activity within patient's tolerance;Monitored during session     Hand Dominance Right   Extremity/Trunk Assessment Upper Extremity  Assessment Upper Extremity Assessment: LUE deficits/detail LUE Deficits / Details: no spontaneous movement noted.  mild tightness in digits.  PROM WFL  LUE Coordination: decreased fine motor;decreased gross motor   Lower Extremity Assessment Lower Extremity Assessment: Defer to PT evaluation   Cervical / Trunk Assessment Cervical / Trunk Assessment: Other exceptions Cervical / Trunk Exceptions: Pt with weakness Lt trunk with Lt passive lateral flexion.  Head and neck rotated to Rt while sitting    Communication Communication Communication: Expressive difficulties;Receptive difficulties   Cognition Arousal/Alertness: Lethargic;Awake/alert Behavior During Therapy: Flat affect;Restless Overall Cognitive Status: Difficult to assess     Current Attention Level: Focused   Following Commands: Follows one step commands inconsistently;Follows one step commands with increased time     Problem Solving: Requires tactile cues;Requires verbal cues;Decreased initiation;Difficulty sequencing;Slow processing General Comments: Pt follows one step commands ~30% of time with facilitation.  She spontaneously said "hey", but no other verbalization noted.  She is easily distracted   General Comments       Exercises       Shoulder Instructions      Home Living Family/patient expects to be discharged to:: Private residence Living Arrangements: Spouse/significant other;Children (10/18 y.o children) Available Help at Discharge: Family Type of Home: House Home Access: Level entry     Home Layout: One level     Bathroom Shower/Tub: Risk analyst characteristics: [place]: None   Additional Comments: husband to take FMLA, and other family available as needed to help out  Lives With: Spouse    Prior Functioning/Environment Level of Independence: Independent        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    OT Diagnosis: Generalized weakness;Cognitive  deficits;Disturbance of vision;Hemiplegia dominant side   OT Problem List: Decreased strength;Decreased activity tolerance;Decreased range of motion;Impaired balance (sitting and/or standing);Impaired vision/perception;Decreased coordination;Decreased safety awareness;Decreased cognition;Decreased knowledge of use of DME or AE;Impaired sensation;Impaired tone;Impaired UE functional use   OT Treatment/Interventions: Self-care/ADL training;Neuromuscular education;DME and/or AE instruction;Therapeutic activities;Cognitive remediation/compensation;Visual/perceptual remediation/compensation;Patient/family education;Balance training;Splinting;Manual therapy    OT Goals(Current goals can be found in the care plan section) Acute Rehab OT Goals Patient Stated Goal: unable to state, family to do what they need to do to help her get better.  OT Goal Formulation: With family Time For Goal Achievement: 04/17/16 Potential to Achieve Goals: Good ADL Goals Pt Will Perform Grooming: with min assist;sitting Pt Will Perform Upper Body Bathing: with min assist;sitting Pt Will Transfer to Toilet: with mod assist;squat pivot transfer;bedside commode Additional ADL Goal #1: Pt will sustain attention to familiar ADL task x 3 mins with min cues  Additional ADL Goal #2: Pt will locate items on Lt with min cues during ADL tasks Additional ADL Goal #3: Family will be independent with PROM Lt UE  OT Frequency: Min 3X/week   Barriers to D/C:            Co-evaluation PT/OT/SLP Co-Evaluation/Treatment: Yes Reason for Co-Treatment: Complexity of the patient's impairments (multi-system involvement);Necessary to address cognition/behavior during functional activity;For patient/therapist safety   OT goals addressed during session: ADL's and self-care;Strengthening/ROM  End of Session Nurse Communication: Mobility status  Activity Tolerance: Patient limited by fatigue Patient left: in bed;with call bell/phone  within reach;with bed alarm set;with family/visitor present   Time: [id number] OT Time Calculation (min): 28 min Charges:  OT General Charges $OT Visit: 1 Procedure OT Evaluation $OT Eval Moderate Complexity: 1 Procedure G-Codes:    Zeeva Courser M 04/09/2016, 7:13 PM

## 2016-04-03 NOTE — Progress Notes (Addendum)
Rehab Admissions Coordinator Note:  Patient was screened by Cleatrice Burke for appropriateness for an Inpatient Acute Rehab Consult per PT recommendation.   At this time, we are recommending await further progress byond EOB before determining rehab venue needs. will follow.  Cleatrice Burke 04/03/2016, 4:41 PM  I can be reached at [phone].

## 2016-04-03 NOTE — Progress Notes (Signed)
Initial Nutrition Assessment  DOCUMENTATION CODES:   Obesity unspecified  INTERVENTION:   Initiate Jevity 1.2 @ 25 ml/hr via Cortrak tube and increase by 10 ml every 4 hours to goal rate of 45 ml/hr.   30 ml Prostat BID.    Tube feeding regimen provides 1496 kcal, 90 grams of protein, and 874 ml of H2O.   NUTRITION DIAGNOSIS:   Inadequate oral intake related to inability to eat as evidenced by NPO status.  GOAL:   Patient will meet greater than or equal to 90% of their needs  MONITOR:   Diet advancement, TF tolerance, I & O's  REASON FOR ASSESSMENT:   Consult Enteral/tube feeding initiation and management  ASSESSMENT:   45 year old female with hypertensive ICH.   4/20 failed swallow 4/21 cortrak, tip at pylorus  Medications reviewed and include: senokot-s  Pt discussed during ICU rounds and with RN.  Nutrition-Focused physical exam completed. Findings are no fat depletion, no muscle depletion, and no edema.    Diet Order:  Diet NPO time specified  Skin:  Reviewed, no issues  Last BM:  unknown  Height:   Ht Readings from Last 1 Encounters:  04/03/16 4\' 11"  (1.499 m)    Weight:   Wt Readings from Last 1 Encounters:  04/01/16 162 lb 0.6 oz (73.5 kg)    Ideal Body Weight:  45 kg  BMI:  Body mass index is 32.71 kg/(m^2).  Estimated Nutritional Needs:   Kcal:  1400-1600  Protein:  85-100 grams  Fluid:  >2 L/day  EDUCATION NEEDS:   No education needs identified at this time  [place], [place], [place] Pager [phone] After Hours Pager

## 2016-04-03 NOTE — Progress Notes (Signed)
PT Cancellation Note  Patient Details Name: Abigail Campos MRN: [id number] DOB: 08/29/1971   Cancelled Treatment:    Reason Eval/Treat Not Completed: Other (comment) (pt on bedrest).  Paged stroke NP to see if bedrest is still appropriate.   Thanks,   Barbarann Ehlers. [place], [place], DPT [phone]   04/03/2016, 12:51 PM

## 2016-04-03 NOTE — Progress Notes (Signed)
OT Cancellation Note  Patient Details Name: SHAMILLE BARNGROVER MRN: [id number] DOB: 06/04/1971   Cancelled Treatment:    Reason Eval/Treat Not Completed: Patient not medically ready - Pt currently with bedrest orders. Will initiate OT eval once activity orders updated.   Darlina Rumpf [place], OTR/L [id number]  04/03/2016, 10:56 AM

## 2016-04-03 NOTE — Progress Notes (Signed)
Speech Pathology   Pt seen earlier today for speech-cognitive assessment. Unable to adequately arouse to attempt po's. Will continue efforts.  Orbie Pyo [place].Ed [employer] [phone]

## 2016-04-03 NOTE — Progress Notes (Signed)
Peripherally Inserted Central Catheter/Midline Placement  The IV Nurse has discussed with the patient and/or persons authorized to consent for the patient, the purpose of this procedure and the potential benefits and risks involved with this procedure.  The benefits include less needle sticks, lab draws from the catheter and patient may be discharged home with the catheter.  Risks include, but not limited to, infection, bleeding, blood clot (thrombus formation), and puncture of an artery; nerve damage and irregular heat beat.  Alternatives to this procedure were also discussed.  Telephone consent obtained from husband due to altered mental state.  PICC/Midline Placement Documentation        Abigail Campos, Abigail Campos 04/03/2016, 3:57 PM

## 2016-04-04 ENCOUNTER — Inpatient Hospital Stay (HOSPITAL_COMMUNITY): Payer: Managed Care, Other (non HMO)

## 2016-04-04 DIAGNOSIS — E785 Hyperlipidemia, unspecified: Secondary | ICD-10-CM

## 2016-04-04 LAB — LIPID PANEL
Cholesterol: 169 mg/dL (ref 0–200)
HDL: 37 mg/dL — ABNORMAL LOW (ref >40)
LDL Cholesterol: 113 mg/dL — ABNORMAL HIGH (ref 0–99)
Total CHOL/HDL Ratio: 4.6 RATIO
Triglycerides: 94 mg/dL (ref <150)
VLDL: 19 mg/dL (ref 0–40)

## 2016-04-04 LAB — CBC
HCT: 43.1 % (ref 36.0–46.0)
Hemoglobin: 13.6 g/dL (ref 12.0–15.0)
MCH: 28.9 pg (ref 26.0–34.0)
MCHC: 31.6 g/dL (ref 30.0–36.0)
MCV: 91.5 fL (ref 78.0–100.0)
Platelets: 280 10*3/uL (ref 150–400)
RBC: 4.71 MIL/uL (ref 3.87–5.11)
RDW: 13.7 % (ref 11.5–15.5)
WBC: 12.8 10*3/uL — ABNORMAL HIGH (ref 4.0–10.5)

## 2016-04-04 LAB — GLUCOSE, CAPILLARY
Glucose-Capillary: 114 mg/dL — ABNORMAL HIGH (ref 65–99)
Glucose-Capillary: 110 mg/dL — ABNORMAL HIGH (ref 65–99)
Glucose-Capillary: 122 mg/dL — ABNORMAL HIGH (ref 65–99)
Glucose-Capillary: 128 mg/dL — ABNORMAL HIGH (ref 65–99)
Glucose-Capillary: 139 mg/dL — ABNORMAL HIGH (ref 65–99)
Glucose-Capillary: 130 mg/dL — ABNORMAL HIGH (ref 65–99)

## 2016-04-04 LAB — BASIC METABOLIC PANEL
Anion gap: 10 (ref 5–15)
BUN: 9 mg/dL (ref 6–20)
CO2: 24 mmol/L (ref 22–32)
Calcium: 8.8 mg/dL — ABNORMAL LOW (ref 8.9–10.3)
Chloride: 101 mmol/L (ref 101–111)
Creatinine, Ser: 0.69 mg/dL (ref 0.44–1.00)
GFR calc Af Amer: >60 mL/min (ref >60)
GFR calc non Af Amer: >60 mL/min (ref >60)
Glucose, Bld: 145 mg/dL — ABNORMAL HIGH (ref 65–99)
Potassium: 3.3 mmol/L — ABNORMAL LOW (ref 3.5–5.1)
Sodium: 135 mmol/L (ref 135–145)

## 2016-04-04 LAB — TSH: TSH: 0.744 u[IU]/mL (ref 0.350–4.500)

## 2016-04-04 LAB — VITAMIN B12: Vitamin B-12: 280 pg/mL (ref 180–914)

## 2016-04-04 LAB — MAGNESIUM: Magnesium: 2.1 mg/dL (ref 1.7–2.4)

## 2016-04-04 LAB — PHOSPHORUS: Phosphorus: 2.2 mg/dL — ABNORMAL LOW (ref 2.5–4.6)

## 2016-04-04 IMAGING — CT CT HEAD W/O CM
1 of 3 series · 12 of 30 positions shown, 15 images · non-contrast
Comparison: Prior CT from [DATE].

CLINICAL DATA: Follow-up exam for stroke.

EXAM:
CT HEAD WITHOUT CONTRAST
TECHNIQUE: Contiguous axial images were obtained from the base of the skull
through the vertex without intravenous contrast.

[Series 203: ax symmetry · axial · 0.55mm/px · z∈[+106,+226]mm · 12 of 51 slices shown, 15 images]
[im 4/51  brain]
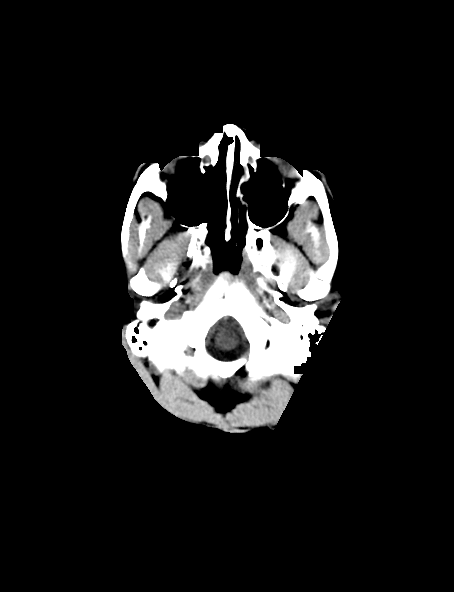
[im 4/51  bone]
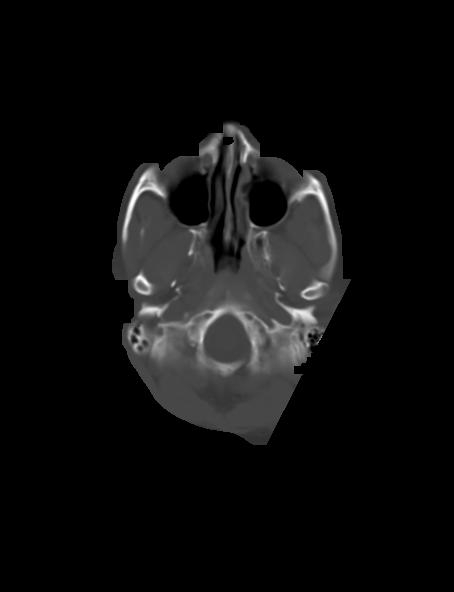
[im 8/51  brain]
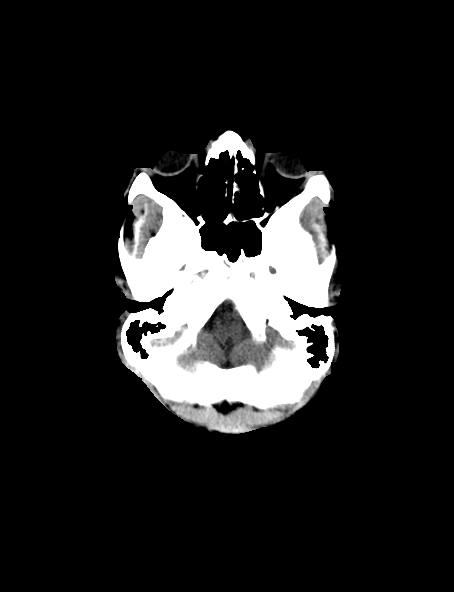
[im 12/51  brain]
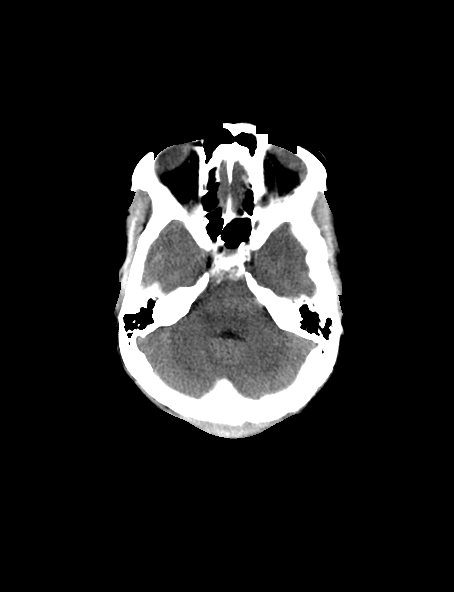
[im 16/51  brain]
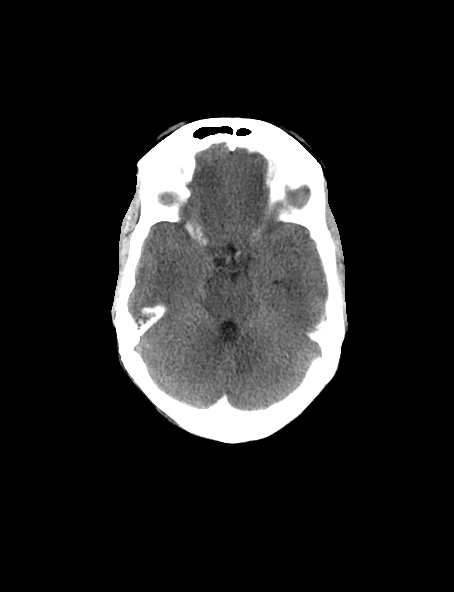
[im 20/51  brain]
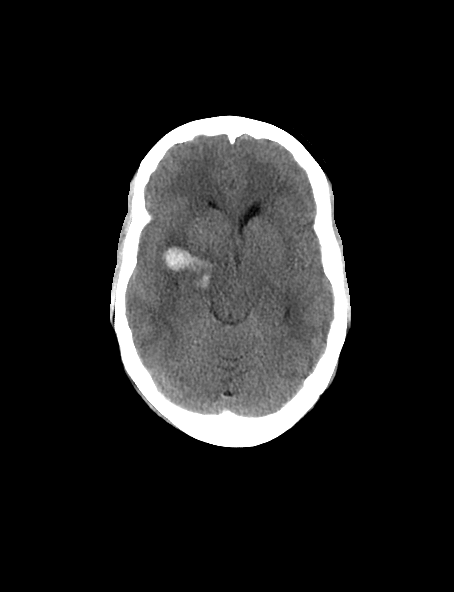
[im 20/51  bone]
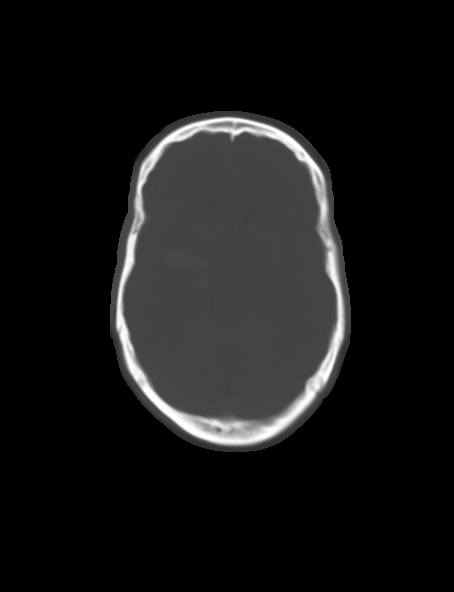
[im 24/51  brain]
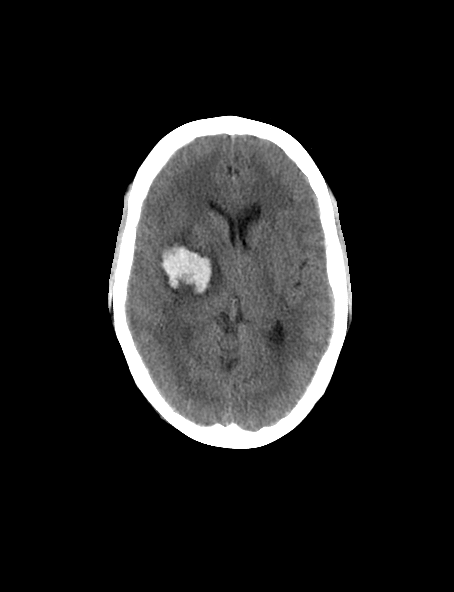
[im 27/51  brain]
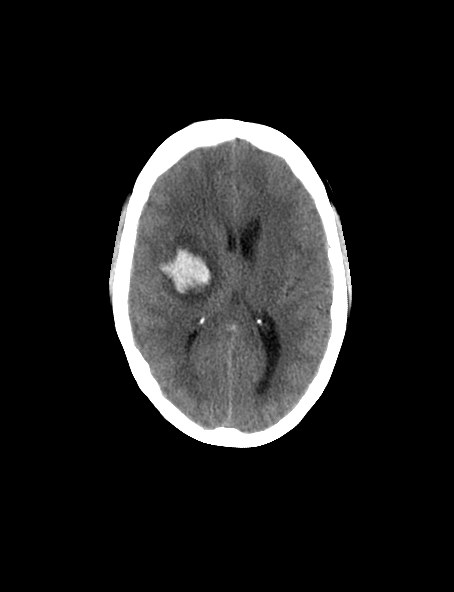
[im 31/51  brain]
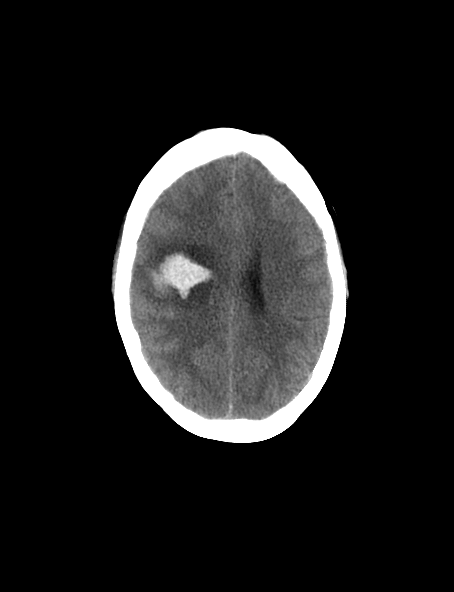
[im 35/51  brain]
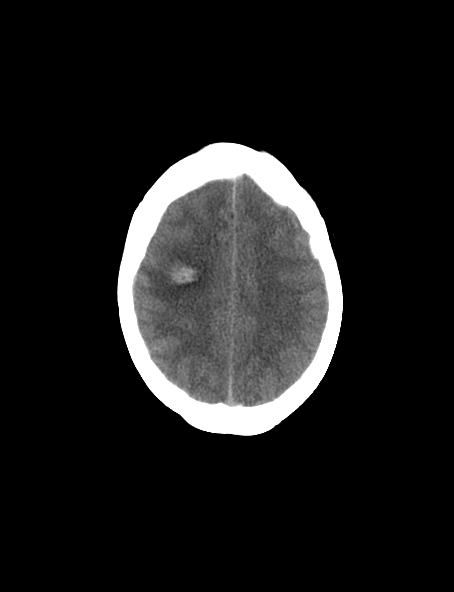
[im 35/51  bone]
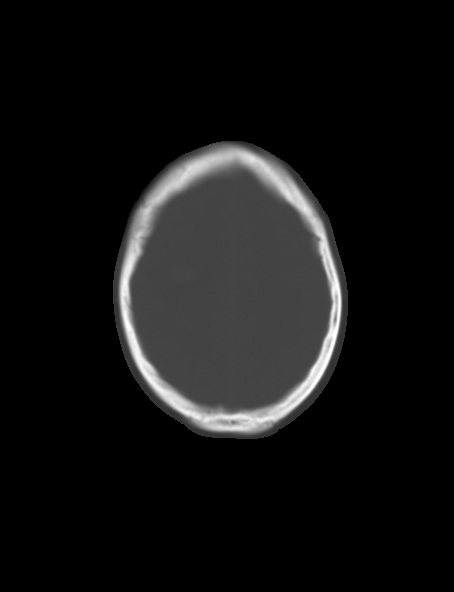
[im 39/51  brain]
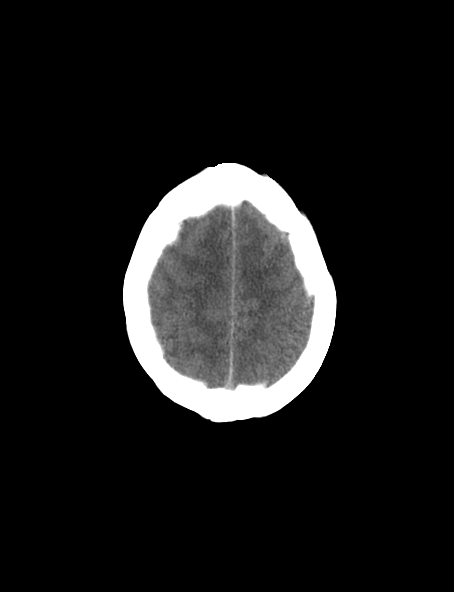
[im 43/51  brain]
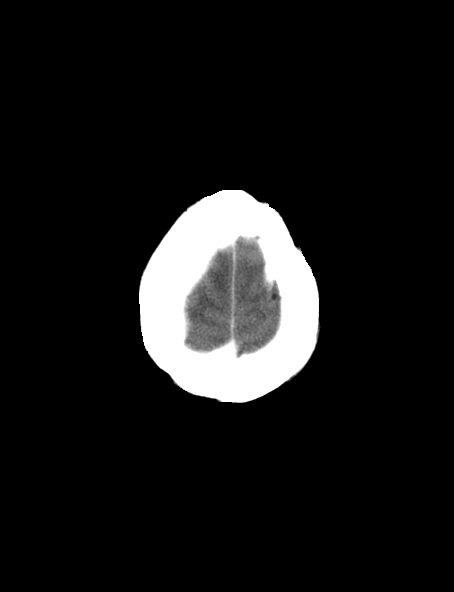
[im 47/51  brain]
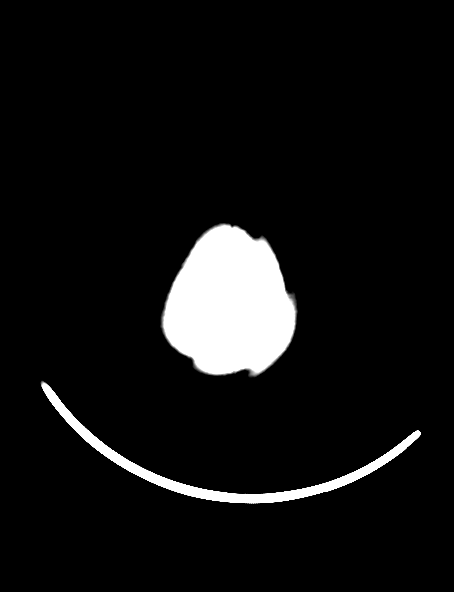

[12 of 30 positions shown; findings below may reference images not displayed]

FINDINGS: Parenchymal hemorrhage centered at the right lentiform nucleus is
not significantly changed measuring approximately 24 x 30 mm,
previously 23 x 29 mm. Adjacent trace subarachnoid hemorrhage
extending into the right sylvian fissure is decreased from prior.
Associated vasogenic edema surrounding the hematoma is slightly
increased and more well defined as compared to prior. Persistent
localized mass effect with partial effacement of the right lateral
ventricle. Associated right-to-left shift of up to 4 mm, similar. No
hydrocephalus. No evidence for ventricular trapping. Basilar cistern
remain patent.

No new intracranial process. No new infarct. No extra-axial fluid
collection.

Scalp soft tissues within normal limits. No acute abnormality about
the globes and orbits.

Paranasal sinuses and mastoid air cells are clear.

Calvarium unchanged.
IMPRESSION: 1. Stable right lentiform nucleus hemorrhage with slightly increased
localized vasogenic edema. Localized mass effect with 4 mm
right-to-left shift not significantly changed.
2. Decreasing small volume acute subarachnoid hemorrhage.

## 2016-04-04 IMAGING — CR DG ABD PORTABLE 1V
1 series · 1 of 1 positions shown · non-contrast
Comparison: [DATE]

CLINICAL DATA: Encounter for nasal/orogastric tube placement.

EXAM:
PORTABLE ABDOMEN - 1 VIEW

[AP]
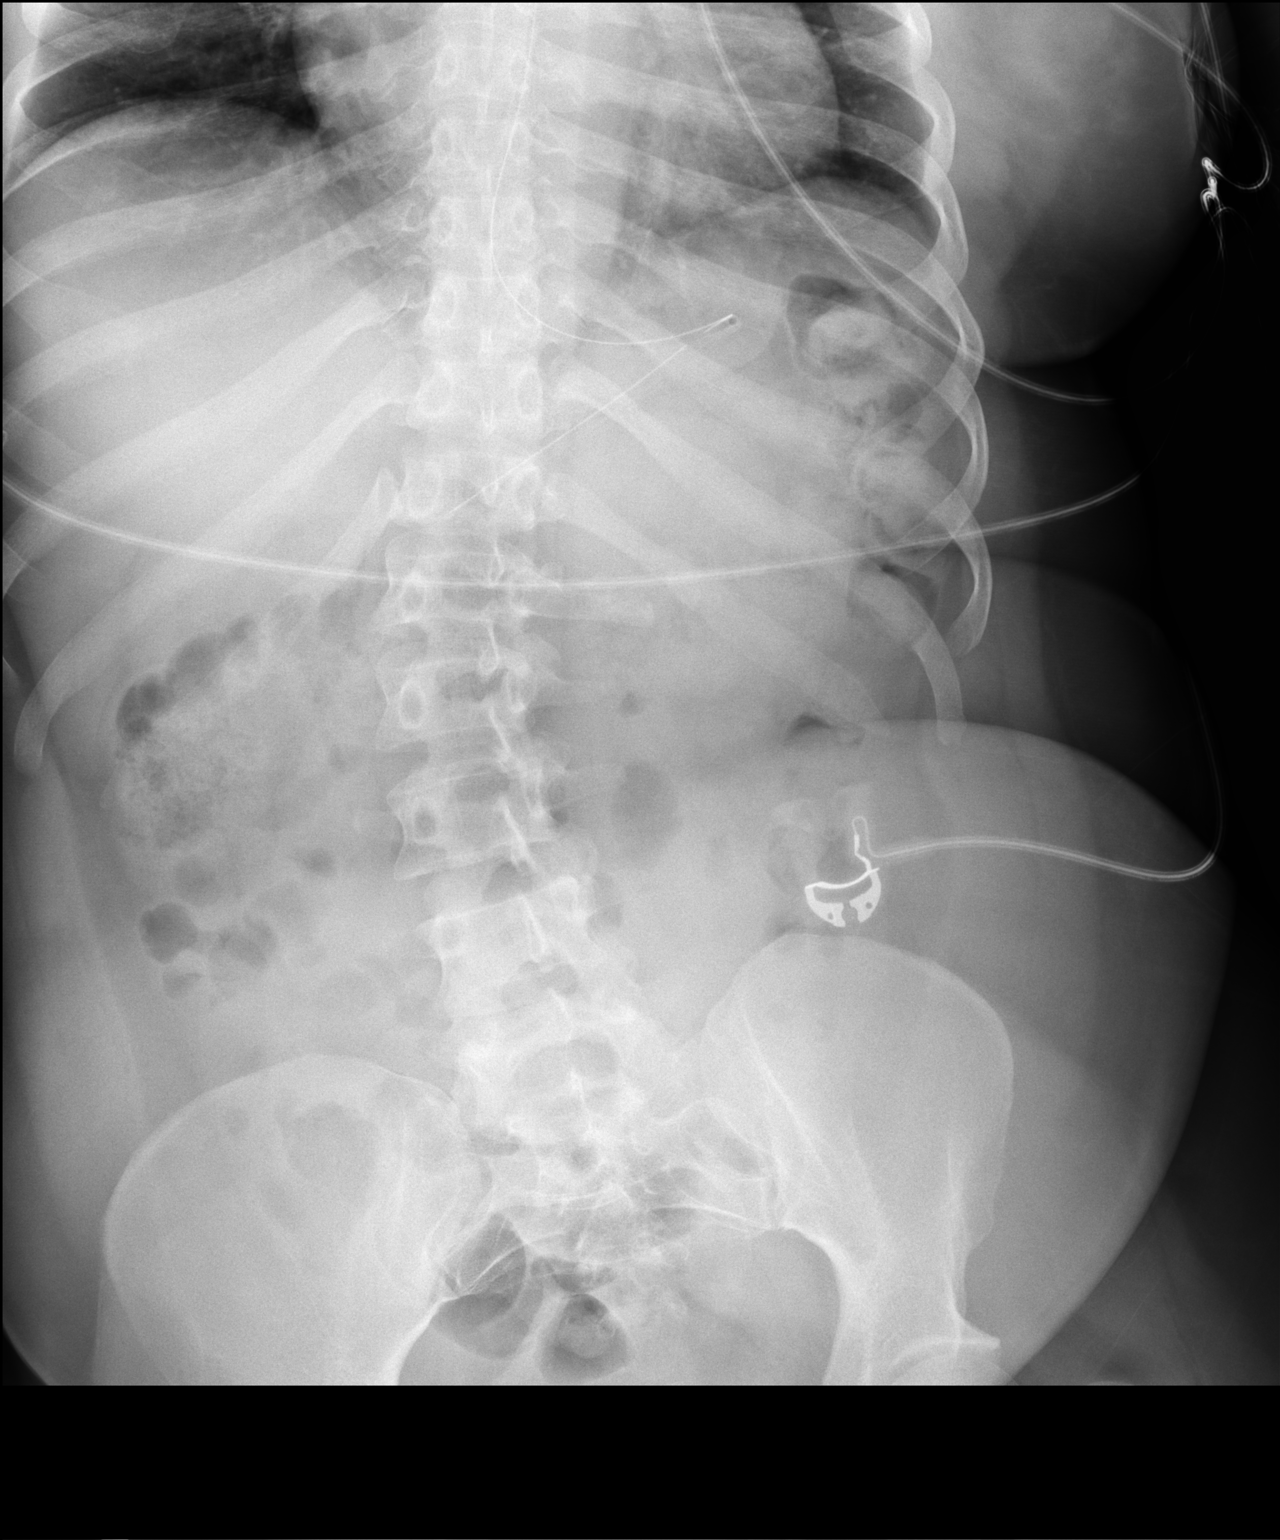

[1 of 1 positions shown; findings below may reference images not displayed]

FINDINGS: Nasal/ orogastric tube passes below diaphragm. Tip projects in the
mid to distal stomach.

Normal bowel gas pattern.
IMPRESSION: Well-positioned nasal/ orogastric tube.

## 2016-04-04 MED ORDER — ATORVASTATIN CALCIUM 10 MG PO TABS
20.0000 mg | ORAL_TABLET | Freq: Every day | ORAL | Status: DC
  Administered 2016-04-04 – 2016-04-08 (×5): 20 mg via ORAL
  Filled 2016-04-04 (×2): qty 2
  Filled 2016-04-04 (×2): qty 1
  Filled 2016-04-04: qty 2

## 2016-04-04 MED ORDER — LABETALOL HCL 5 MG/ML IV SOLN: 10.0000 mg | INTRAVENOUS | Status: DC | PRN

## 2016-04-04 MED ORDER — CETYLPYRIDINIUM CHLORIDE 0.05 % MT LIQD
7.0000 mL | Freq: Two times a day (BID) | OROMUCOSAL | Status: DC
  Administered 2016-04-05 – 2016-04-08 (×5): 7 mL via OROMUCOSAL

## 2016-04-04 MED ORDER — CHLORHEXIDINE GLUCONATE 0.12 % MT SOLN
15.0000 mL | Freq: Two times a day (BID) | OROMUCOSAL | Status: DC
  Administered 2016-04-04 – 2016-04-08 (×7): 15 mL via OROMUCOSAL
  Filled 2016-04-04 (×6): qty 15

## 2016-04-04 NOTE — Progress Notes (Signed)
Pt removed NG tube, ng tube replaced, confirmation ordered.

## 2016-04-04 NOTE — Progress Notes (Signed)
SLP Cancellation Note  Patient Details Name: Abigail Campos MRN: [id number] DOB: 1971-06-30   Cancelled treatment:       Reason Eval/Treat Not Completed: Medical issues which prohibited therapy; pt too lethargic for po intake   Juliona Vales,PAT, M.S.,CCC-SLP 04/04/2016, 3:45 PM

## 2016-04-04 NOTE — Progress Notes (Signed)
STROKE TEAM PROGRESS NOTE   SUBJECTIVE (INTERVAL HISTORY) Her husband and sons are at the bedside. Her blood pressure is better controlled. She is more awake alert and follows some central commands. Still has right gaze and left neglect. CT repeat stable. Off cleviprex.   OBJECTIVE Temp:  [97.8 F (36.6 C)-100.7 F (38.2 C)] 100.7 F (38.2 C) (04/22 1600) Pulse Rate:  [50-92] 63 (04/22 1530) Cardiac Rhythm:  [-] Normal sinus rhythm;Sinus bradycardia (04/22 0800) Resp:  [18-27] 21 (04/22 1530) BP: (132-185)/(67-127) 161/100 mmHg (04/22 1530) SpO2:  [97 %-100 %] 98 % (04/22 1530) Weight:  [161 lb 9.6 oz (73.3 kg)] 161 lb 9.6 oz (73.3 kg) (04/22 0412)  CBC:   Recent Labs Lab 04/01/16 1533 04/01/16 1541 04/04/16 0415  WBC 9.3  --  12.8*  NEUTROABS 4.3  --   --   HGB 13.4 16.0* 13.6  HCT 41.6 47.0* 43.1  MCV 90.2  --  91.5  PLT 304  --  123456    Basic Metabolic Panel:   Recent Labs Lab 04/01/16 1533 04/01/16 1541 04/03/16 0340 04/04/16 0415  NA 136 139  --  135  K 3.0* 3.0*  --  3.3*  CL 101 99*  --  101  CO2 22  --   --  24  GLUCOSE 133* 135*  --  145*  BUN 9 10  --  9  CREATININE 0.89 0.80  --  0.69  CALCIUM 9.5  --   --  8.8*  MG  --   --  1.8 2.1  PHOS  --   --  2.7 2.2*    Lipid Panel:     Component Value Date/Time   CHOL 169 04/04/2016 0415   TRIG 94 04/04/2016 0415   HDL 37* 04/04/2016 0415   CHOLHDL 4.6 04/04/2016 0415   VLDL 19 04/04/2016 0415   LDLCALC 113* 04/04/2016 0415   HgbA1c:  Lab Results  Component Value Date   HGBA1C 5.3 05/12/2007   Urine Drug Screen:     Component Value Date/Time   LABOPIA NONE DETECTED 04/01/2016 1806   COCAINSCRNUR NONE DETECTED 04/01/2016 1806   LABBENZ NONE DETECTED 04/01/2016 1806   AMPHETMU NONE DETECTED 04/01/2016 1806   THCU NONE DETECTED 04/01/2016 1806   LABBARB NONE DETECTED 04/01/2016 1806      IMAGING I have personally reviewed the radiological images below and agree with the radiology  interpretations.  Ct Angio Head and neck W/cm &/or Wo Cm  04/02/2016  IMPRESSION: 1. Negative arterial findings on CTA head and neck, aside from mild calcified atherosclerosis of the left ICA siphon. 2. Stable hyperdense intra-axial hemorrhage in the right hemisphere since yesterday. Negative CTA Spot Sign, and no evidence of associated vascular malformation. Estimated blood volume of 21 mL. Mildly increased surrounding edema, but stable mild regional mass effect. 3. Poor dentition.   Ct Head Wo Contrast  04/04/2016  IMPRESSION: 1. Stable right lentiform nucleus hemorrhage with slightly increased localized vasogenic edema. Localized mass effect with 4 mm right-to-left shift not significantly changed. 2. Decreasing small volume acute subarachnoid hemorrhage.  04/02/2016  IMPRESSION: Stable RIGHT putaminal intracerebral hemorrhage, favored to represent a hypertensive related event.    TTE - - Normal LV size and systolic function, EF 123456. Mild LV  hypertrophy. Moderate diastolic dysfunction. Normal RV size and  systolic function. No significant valvular abnormalities.    PHYSICAL EXAM  Temp:  [97.8 F (36.6 C)-100.7 F (38.2 C)] 100.7 F (38.2 C) (04/22 1600) Pulse Rate:  [  50-92] 63 (04/22 1530) Resp:  [18-27] 21 (04/22 1530) BP: (132-185)/(67-127) 161/100 mmHg (04/22 1530) SpO2:  [97 %-100 %] 98 % (04/22 1530) Weight:  [161 lb 9.6 oz (73.3 kg)] 161 lb 9.6 oz (73.3 kg) (04/22 0412)  General - Well nourished, well developed, in no apparent distress.  Ophthalmologic - Fundi not visualized due to noncooperation.  Cardiovascular - Regular rate and rhythm.  Mental Status -  Awake alert, follow some central commands but not peripheral commands on the right. Nonverbal, no speech output. Left neglect, not blinking to visual threat on the left. Right gaze preference, not crossing midline. PERRL. Left facial droop. Left UE 0/5 increased tone, left LE 1/5 decreased tone. RUE and RLE 5/5 with  spontaneous movement. Sensation, coordination and gait not tested.    ASSESSMENT/PLAN [name] is a 45 y.o. female with history of HA and HTN presenting with severe HA and L sided weakness. CT showed a R BG hemorrhage.   Stroke:  right basal ganglia hemorrhage most likely secondary to hypertensive emergency.  Resultant  Right gaze, left neglect, L hemiparesis  CT head  right BG hemorrhage   Repeat CT head stable hematoma  CTA head & neck negative for AVM or aneurysm   2D Echo EF 60-65%   SCDs for VTE prophylaxis Diet NPO time specified - on TF  No antithrombotic prior to admission  Ongoing aggressive stroke risk factor management  Therapy recommendations: pending  Disposition:  pending   Hypertensive Emergency  SBP >200 per EMS in setting of neurologic symptoms. 179/109 on admission.  Initially started on nicardipine with poor control, changed to Cleviprex with better control  Hx difficulty controlling BP per husband  SBP goal < 160   Now stable 120-130s   On plendil and bystolic  Hyperlipidemia  LDL 113, goal < 70  No statin at home  add lipitor 20mg    Other Stroke Risk Factors  Obesity, Body mass index is 32.62 kg/(m^2).   Family hx stroke (maternal grandfather)  Hospital day # 3  This patient is critically ill due to right BG ICH and hypertensive emergency and at significant risk of neurological worsening, death form rebleeding, cerebral edema, brain herniation. This patient's care requires constant monitoring of vital signs, hemodynamics, respiratory and cardiac monitoring, review of multiple databases, neurological assessment, discussion with family, other specialists and medical decision making of high complexity. I spent 40 minutes of neurocritical care time in the care of this patient.  Rosalin Hawking, MD PhD Stroke Neurology 04/04/2016 5:32 PM  To contact Stroke Continuity provider, please refer to [email or web address] After hours, contact  General Neurology

## 2016-04-04 NOTE — Progress Notes (Signed)
Pt removed Cortrak, NG replaced, XR confirmed placement.

## 2016-04-05 LAB — BASIC METABOLIC PANEL
Anion gap: 8 (ref 5–15)
BUN: 10 mg/dL (ref 6–20)
CO2: 26 mmol/L (ref 22–32)
Calcium: 8.6 mg/dL — ABNORMAL LOW (ref 8.9–10.3)
Chloride: 105 mmol/L (ref 101–111)
Creatinine, Ser: 0.74 mg/dL (ref 0.44–1.00)
GFR calc Af Amer: >60 mL/min (ref >60)
GFR calc non Af Amer: >60 mL/min (ref >60)
Glucose, Bld: 138 mg/dL — ABNORMAL HIGH (ref 65–99)
Potassium: 3.5 mmol/L (ref 3.5–5.1)
Sodium: 139 mmol/L (ref 135–145)

## 2016-04-05 LAB — CBC
HCT: 42.8 % (ref 36.0–46.0)
Hemoglobin: 13.4 g/dL (ref 12.0–15.0)
MCH: 28.8 pg (ref 26.0–34.0)
MCHC: 31.3 g/dL (ref 30.0–36.0)
MCV: 92 fL (ref 78.0–100.0)
Platelets: 250 10*3/uL (ref 150–400)
RBC: 4.65 MIL/uL (ref 3.87–5.11)
RDW: 14 % (ref 11.5–15.5)
WBC: 15 10*3/uL — ABNORMAL HIGH (ref 4.0–10.5)

## 2016-04-05 LAB — GLUCOSE, CAPILLARY
Glucose-Capillary: 153 mg/dL — ABNORMAL HIGH (ref 65–99)
Glucose-Capillary: 115 mg/dL — ABNORMAL HIGH (ref 65–99)
Glucose-Capillary: 123 mg/dL — ABNORMAL HIGH (ref 65–99)
Glucose-Capillary: 110 mg/dL — ABNORMAL HIGH (ref 65–99)
Glucose-Capillary: 142 mg/dL — ABNORMAL HIGH (ref 65–99)
Glucose-Capillary: 128 mg/dL — ABNORMAL HIGH (ref 65–99)

## 2016-04-05 LAB — PHOSPHORUS: Phosphorus: 2.3 mg/dL — ABNORMAL LOW (ref 2.5–4.6)

## 2016-04-05 LAB — HIV ANTIBODY (ROUTINE TESTING W REFLEX): HIV Screen 4th Generation wRfx: NONREACTIVE

## 2016-04-05 LAB — RPR: RPR Ser Ql: NONREACTIVE

## 2016-04-05 LAB — MAGNESIUM: Magnesium: 2.1 mg/dL (ref 1.7–2.4)

## 2016-04-05 LAB — TRIGLYCERIDES: Triglycerides: 66 mg/dL (ref <150)

## 2016-04-05 MED ORDER — BISACODYL 10 MG RE SUPP
10.0000 mg | Freq: Once | RECTAL | Status: AC
  Administered 2016-04-05: 10 mg via RECTAL
  Filled 2016-04-05: qty 1

## 2016-04-05 MED ORDER — LISINOPRIL 20 MG PO TABS: 20.0000 mg | ORAL_TABLET | Freq: Every day | ORAL | Status: DC

## 2016-04-05 MED ORDER — LISINOPRIL 20 MG PO TABS
20.0000 mg | ORAL_TABLET | Freq: Two times a day (BID) | ORAL | Status: DC
  Administered 2016-04-05 – 2016-04-08 (×6): 20 mg via ORAL
  Filled 2016-04-05 (×6): qty 1

## 2016-04-05 NOTE — Evaluation (Addendum)
Clinical/Bedside Swallow Evaluation Patient Details  Name: Abigail Campos MRN: [id number] Date of Birth: Apr 09, 1971  Today's Date: 04/05/2016 Time: SLP Start Time (ACUTE ONLY): 1645 SLP Stop Time (ACUTE ONLY): 1706 SLP Time Calculation (min) (ACUTE ONLY): 21 min  Past Medical History:  Past Medical History  Diagnosis Date  . Headache(784.0)     developed migraines after 2nd pregnancy, had a negative CT of the head  . Hypertension   . Essential hypertension 10/19/2007    03-2010: metoprolol changed to bystolic (was not feeling well on it, no specific allergy or reaction)     Past Surgical History:  Past Surgical History  Procedure Laterality Date  . Inguinal hernia repair      2004  . Cesarean section      [id number]  . Abdominal hysterectomy  11-14-07    no oophorectomy per surgical report   HPI:  45 y.o. female with h/o migraines and HTN, who presented to ED via EMS after peers observed acute onset of L facial asymmetry, R gaze and unresponsiveness. Pt had c/o headache throughout the day. CT Head 4/19 intraparenchymal hemorrhage arising from posterior, lateral R basal ganglia with subarachnoid hemorrhage extending into the sylvian fissure on R. There is mass effect with mild midline shift toward L of approximately 7 mm. Volume of hemorrhage in R basal ganglia resion is ~26 cubic cm. Note that there is additional hemorrhage in subarachnoid space on R which cannot be easily quantified with respect to specific volume of hemorrhage. No prior h/o SLP intervention.   Assessment / Plan / Recommendation Clinical Impression   Pt with neurogenic oropharyngeal dysphagia characterized by left facial/lingual impairment resulting in left spillage and left buccal pocketing with puree/solids, impaired mastication with solids and overall mild delay in the initiation of the swallow with delayed cough with increased volumes of thin liquids paired with pt impulsivity.  Pt was able to consume thin via  tsp without overt s/s of aspiration, but silent aspiration cannot be r/o without objective testing.  Recommend Dysphagia 1 (puree)/thin via tsp only and MBS to f/u to r/o aspiration and determine safest diet for pt to consume at present time; ST will f/u    Aspiration Risk  Moderate aspiration risk    Diet Recommendation   Dysphagia 1/thin via tsp only  Medication Administration: Crushed with puree    Other  Recommendations Oral Care Recommendations: Oral care BID   Follow up Recommendations  Inpatient Rehab    Frequency and Duration min 2x/week  2 weeks       Prognosis Prognosis for Safe Diet Advancement: Good Barriers to Reach Goals: Cognitive deficits      Swallow Study   General Date of Onset: 04/02/16 HPI: 45 y.o. female with h/o migraines and HTN, who presented to ED via EMS after peers observed acute onset of L facial asymmetry, R gaze and unresponsiveness. Pt had c/o headache throughout the day. CT Head 4/19 intraparenchymal hemorrhage arising from posterior, lateral R basal ganglia with subarachnoid hemorrhage extending into the sylvian fissure on R. There is mass effect with mild midline shift toward L of approximately 7 mm. Volume of hemorrhage in R basal ganglia resion is ~26 cubic cm. Note that there is additional hemorrhage in subarachnoid space on R which cannot be easily quantified with respect to specific volume of hemorrhage. No prior h/o SLP intervention. Type of Study: Bedside Swallow Evaluation Diet Prior to this Study: NPO Temperature Spikes Noted: No Respiratory Status: Room air History of Recent Intubation:  No Behavior/Cognition: Alert;Cooperative;Impulsive;Distractible Oral Cavity Assessment: Within Functional Limits Oral Care Completed by SLP: Yes Oral Cavity - Dentition: Adequate natural dentition Self-Feeding Abilities: Needs assist;Needs set up Patient Positioning: Upright in bed Baseline Vocal Quality: Low vocal intensity Volitional Cough:  Weak Volitional Swallow: Able to elicit    Oral/Motor/Sensory Function Overall Oral Motor/Sensory Function: Severe impairment Facial ROM: Reduced left Facial Symmetry: Abnormal symmetry left Facial Strength: Reduced left Facial Sensation: Within Functional Limits Lingual ROM: Reduced left Lingual Symmetry: Abnormal symmetry left Lingual Strength: Reduced Velum: Within Functional Limits Mandible: Impaired   Ice Chips Ice chips: Impaired Presentation: Spoon Oral Phase Impairments: Reduced labial seal;Reduced lingual movement/coordination Oral Phase Functional Implications: Left anterior spillage Pharyngeal Phase Impairments: Suspected delayed Swallow   Thin Liquid Thin Liquid: Impaired Presentation: Cup;Spoon;Straw Oral Phase Impairments: Reduced labial seal Oral Phase Functional Implications: Left anterior spillage Pharyngeal  Phase Impairments: Suspected delayed Swallow;Cough - Delayed (delayed cough with larger boluses only)    Nectar Thick Nectar Thick Liquid: Not tested   Honey Thick Honey Thick Liquid: Not tested   Puree Puree: Impaired Presentation: Spoon Oral Phase Impairments: Reduced lingual movement/coordination Oral Phase Functional Implications: Left lateral sulci pocketing Pharyngeal Phase Impairments: Suspected delayed Swallow   Solid      Solid: Impaired Presentation: Spoon Oral Phase Impairments: Reduced labial seal;Reduced lingual movement/coordination Oral Phase Functional Implications: Left lateral sulci pocketing;Prolonged oral transit;Impaired mastication;Oral residue Pharyngeal Phase Impairments: Suspected delayed Swallow;Multiple swallows        Daleon Willinger,PAT, M.S., CCC-SLP 04/05/2016,5:20 PM

## 2016-04-05 NOTE — Progress Notes (Signed)
Spoke with Dr. Wendee Beavers about patient removing feeding tube and not cooperating with replacement. MD agreed it could wait for speech eval in am.

## 2016-04-05 NOTE — Progress Notes (Signed)
STROKE TEAM PROGRESS NOTE   SUBJECTIVE (INTERVAL HISTORY) Her RN is at the bedside. Her blood pressure is still high. She pulled off her NG tube, so she was put back on cleviprex drip. She is more awake alert and follows some central commands. Still has right gaze and left neglect. Pending speech evaluation.   OBJECTIVE Temp:  [98.2 F (36.8 C)-100.7 F (38.2 C)] 98.3 F (36.8 C) (04/23 1200) Pulse Rate:  [49-114] 60 (04/23 1215) Cardiac Rhythm:  [-] Normal sinus rhythm;Sinus bradycardia (04/23 0800) Resp:  [9-30] 19 (04/23 1215) BP: (128-189)/(79-119) 180/80 mmHg (04/23 1215) SpO2:  [96 %-100 %] 98 % (04/23 1215) Weight:  [166 lb 0.1 oz (75.3 kg)] 166 lb 0.1 oz (75.3 kg) (04/23 0500)  CBC:   Recent Labs Lab 04/01/16 1533  04/04/16 0415 04/05/16 0739  WBC 9.3  --  12.8* 15.0*  NEUTROABS 4.3  --   --   --   HGB 13.4  < > 13.6 13.4  HCT 41.6  < > 43.1 42.8  MCV 90.2  --  91.5 92.0  PLT 304  --  280 250  < > = values in this interval not displayed.  Basic Metabolic Panel:   Recent Labs Lab 04/04/16 0415 04/05/16 0500 04/05/16 0739  NA 135  --  139  K 3.3*  --  3.5  CL 101  --  105  CO2 24  --  26  GLUCOSE 145*  --  138*  BUN 9  --  10  CREATININE 0.69  --  0.74  CALCIUM 8.8*  --  8.6*  MG 2.1 2.1  --   PHOS 2.2* 2.3*  --     Lipid Panel:     Component Value Date/Time   CHOL 169 04/04/2016 0415   TRIG 66 04/05/2016 0739   HDL 37* 04/04/2016 0415   CHOLHDL 4.6 04/04/2016 0415   VLDL 19 04/04/2016 0415   LDLCALC 113* 04/04/2016 0415   HgbA1c:  Lab Results  Component Value Date   HGBA1C 5.3 05/12/2007   Urine Drug Screen:     Component Value Date/Time   LABOPIA NONE DETECTED 04/01/2016 1806   COCAINSCRNUR NONE DETECTED 04/01/2016 1806   LABBENZ NONE DETECTED 04/01/2016 1806   AMPHETMU NONE DETECTED 04/01/2016 1806   THCU NONE DETECTED 04/01/2016 1806   LABBARB NONE DETECTED 04/01/2016 1806      IMAGING I have personally reviewed the radiological  images below and agree with the radiology interpretations.  Ct Angio Head and neck W/cm &/or Wo Cm  04/02/2016  IMPRESSION: 1. Negative arterial findings on CTA head and neck, aside from mild calcified atherosclerosis of the left ICA siphon. 2. Stable hyperdense intra-axial hemorrhage in the right hemisphere since yesterday. Negative CTA Spot Sign, and no evidence of associated vascular malformation. Estimated blood volume of 21 mL. Mildly increased surrounding edema, but stable mild regional mass effect. 3. Poor dentition.   Ct Head Wo Contrast  04/04/2016  IMPRESSION: 1. Stable right lentiform nucleus hemorrhage with slightly increased localized vasogenic edema. Localized mass effect with 4 mm right-to-left shift not significantly changed. 2. Decreasing small volume acute subarachnoid hemorrhage.  04/02/2016  IMPRESSION: Stable RIGHT putaminal intracerebral hemorrhage, favored to represent a hypertensive related event.    TTE - - Normal LV size and systolic function, EF 123456. Mild LV  hypertrophy. Moderate diastolic dysfunction. Normal RV size and  systolic function. No significant valvular abnormalities.    PHYSICAL EXAM  Temp:  [98.2 F (36.8 C)-100.7  F (38.2 C)] 98.3 F (36.8 C) (04/23 1200) Pulse Rate:  [49-114] 60 (04/23 1215) Resp:  [9-30] 19 (04/23 1215) BP: (128-189)/(79-119) 180/80 mmHg (04/23 1215) SpO2:  [96 %-100 %] 98 % (04/23 1215) Weight:  [166 lb 0.1 oz (75.3 kg)] 166 lb 0.1 oz (75.3 kg) (04/23 0500)  General - Well nourished, well developed, in no apparent distress.  Ophthalmologic - Fundi not visualized due to noncooperation.  Cardiovascular - Regular rate and rhythm.  Mental Status -  Awake alert, follow some central commands but not peripheral commands on the right. Able to tell me her name but no other speech outpt. Left neglect, not blinking to visual threat on the left. Right gaze preference, not crossing midline. PERRL. Left facial droop. Left UE 0/5  increased tone, left LE 1/5 decreased tone. RUE and RLE 5/5 with spontaneous movement. Sensation, coordination and gait not tested.    ASSESSMENT/PLAN Ms. ALISSHA SHIRES is a 45 y.o. female with history of HA and HTN presenting with severe HA and L sided weakness. CT showed a R BG hemorrhage.   Stroke:  right basal ganglia hemorrhage most likely secondary to hypertensive emergency.  Resultant  Right gaze, left neglect, L hemiparesis  CT head  right BG hemorrhage   Repeat CT head stable hematoma  CTA head & neck negative for AVM or aneurysm   2D Echo EF 60-65%   SCDs for VTE prophylaxis Diet NPO time specified - on TF  No antithrombotic prior to admission  Ongoing aggressive stroke risk factor management  Therapy recommendations: pending  Disposition:  pending   Hypertensive Emergency  SBP >200 per EMS in setting of neurologic symptoms. 179/109 on admission.  Initially started on nicardipine with poor control, changed to Cleviprex with better control  Hx difficulty controlling BP per husband  SBP goal < 160   Put off NG tube, back on cleviprex   On plendil and bystolic  Add lisinopril for BP control  RA ultrasound 03/2015 showed no stenosis.  Hyperlipidemia  LDL 113, goal < 70  No statin at home  add lipitor 20mg    Other Stroke Risk Factors  Obesity, Body mass index is 33.51 kg/(m^2).   Family hx stroke (maternal grandfather)  Hospital day # 4  This patient is critically ill due to right BG ICH and hypertensive emergency and at significant risk of neurological worsening, death form rebleeding, cerebral edema, brain herniation. This patient's care requires constant monitoring of vital signs, hemodynamics, respiratory and cardiac monitoring, review of multiple databases, neurological assessment, discussion with family, other specialists and medical decision making of high complexity. I spent 40 minutes of neurocritical care time in the care of this  patient.  Rosalin Hawking, MD PhD Stroke Neurology 04/05/2016 1:45 PM  To contact Stroke Continuity provider, please refer to [email or web address] After hours, contact General Neurology

## 2016-04-06 ENCOUNTER — Inpatient Hospital Stay (HOSPITAL_COMMUNITY): Payer: Managed Care, Other (non HMO)

## 2016-04-06 DIAGNOSIS — I10 Essential (primary) hypertension: Secondary | ICD-10-CM

## 2016-04-06 DIAGNOSIS — I69320 Aphasia following cerebral infarction: Secondary | ICD-10-CM

## 2016-04-06 DIAGNOSIS — R001 Bradycardia, unspecified: Secondary | ICD-10-CM

## 2016-04-06 DIAGNOSIS — E876 Hypokalemia: Secondary | ICD-10-CM | POA: Diagnosis present

## 2016-04-06 DIAGNOSIS — D72829 Elevated white blood cell count, unspecified: Secondary | ICD-10-CM

## 2016-04-06 DIAGNOSIS — I610 Nontraumatic intracerebral hemorrhage in hemisphere, subcortical: Principal | ICD-10-CM

## 2016-04-06 DIAGNOSIS — I69391 Dysphagia following cerebral infarction: Secondary | ICD-10-CM

## 2016-04-06 DIAGNOSIS — I69322 Dysarthria following cerebral infarction: Secondary | ICD-10-CM

## 2016-04-06 DIAGNOSIS — G43909 Migraine, unspecified, not intractable, without status migrainosus: Secondary | ICD-10-CM

## 2016-04-06 LAB — MAGNESIUM: Magnesium: 2.2 mg/dL (ref 1.7–2.4)

## 2016-04-06 LAB — BASIC METABOLIC PANEL
Anion gap: 8 (ref 5–15)
BUN: 9 mg/dL (ref 6–20)
CO2: 27 mmol/L (ref 22–32)
Calcium: 8.7 mg/dL — ABNORMAL LOW (ref 8.9–10.3)
Chloride: 105 mmol/L (ref 101–111)
Creatinine, Ser: 0.75 mg/dL (ref 0.44–1.00)
GFR calc Af Amer: >60 mL/min (ref >60)
GFR calc non Af Amer: >60 mL/min (ref >60)
Glucose, Bld: 117 mg/dL — ABNORMAL HIGH (ref 65–99)
Potassium: 3.4 mmol/L — ABNORMAL LOW (ref 3.5–5.1)
Sodium: 140 mmol/L (ref 135–145)

## 2016-04-06 LAB — CBC
HCT: 40.5 % (ref 36.0–46.0)
Hemoglobin: 12.7 g/dL (ref 12.0–15.0)
MCH: 28.7 pg (ref 26.0–34.0)
MCHC: 31.4 g/dL (ref 30.0–36.0)
MCV: 91.6 fL (ref 78.0–100.0)
Platelets: 240 10*3/uL (ref 150–400)
RBC: 4.42 MIL/uL (ref 3.87–5.11)
RDW: 13.8 % (ref 11.5–15.5)
WBC: 12.1 10*3/uL — ABNORMAL HIGH (ref 4.0–10.5)

## 2016-04-06 LAB — GLUCOSE, CAPILLARY
Glucose-Capillary: 107 mg/dL — ABNORMAL HIGH (ref 65–99)
Glucose-Capillary: 110 mg/dL — ABNORMAL HIGH (ref 65–99)

## 2016-04-06 LAB — HEMOGLOBIN A1C
Hgb A1c MFr Bld: 6 % — ABNORMAL HIGH (ref 4.8–5.6)
Mean Plasma Glucose: 126 mg/dL

## 2016-04-06 LAB — PHOSPHORUS: Phosphorus: 3 mg/dL (ref 2.5–4.6)

## 2016-04-06 IMAGING — RF DG SWALLOWING FUNCTION - NRPT [facility]
1 series · 18 of 24 positions shown · non-contrast
Comparison: none

[Series 1: run · 13 acquisitions, 18 frames shown]
[im 1/13]
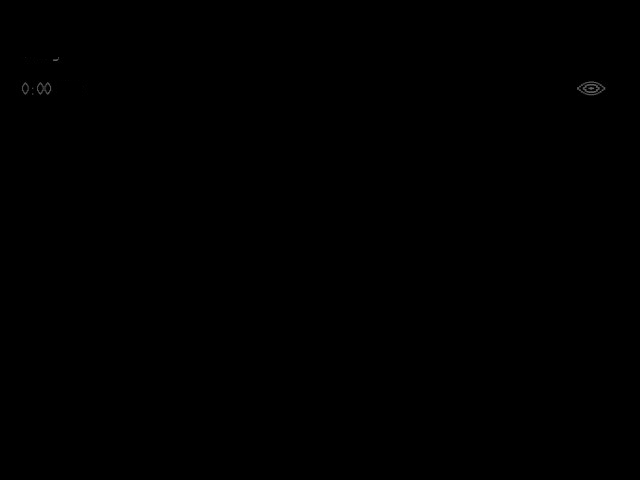
[im 2/13]
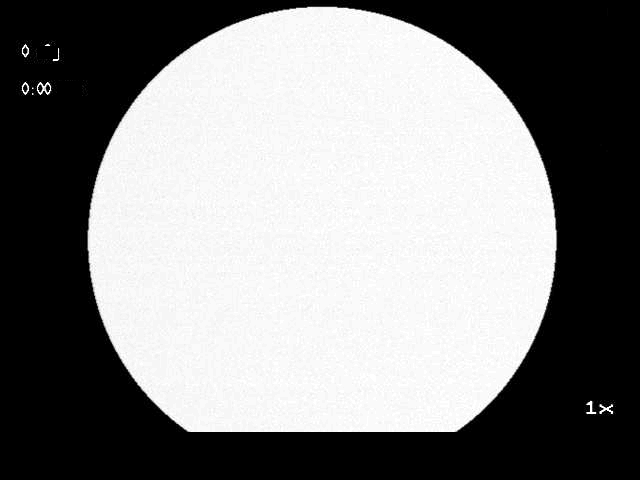
[im 2/13]
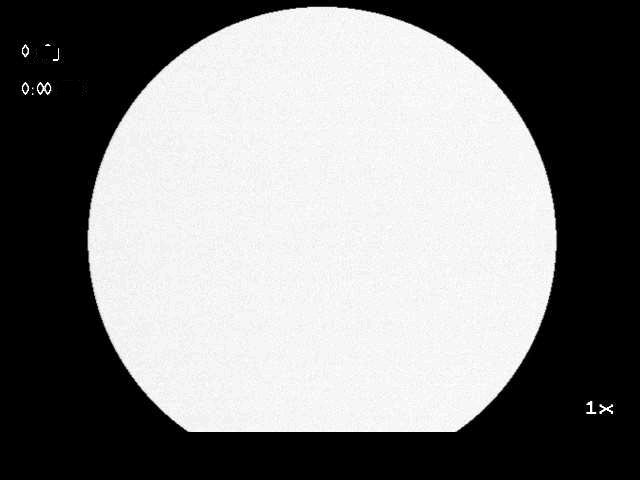
[im 3/13]
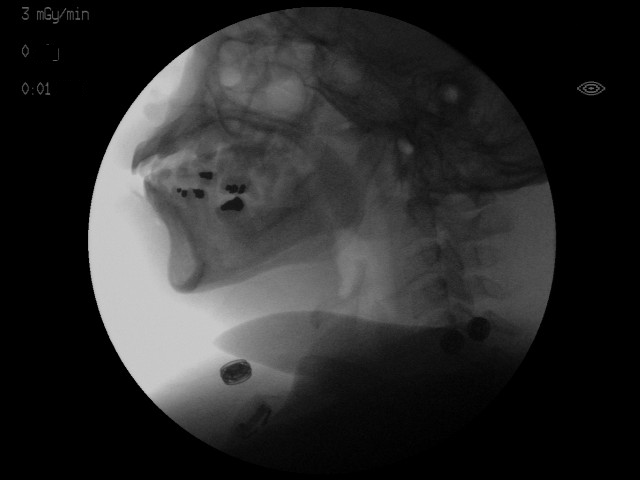
[im 4/13]
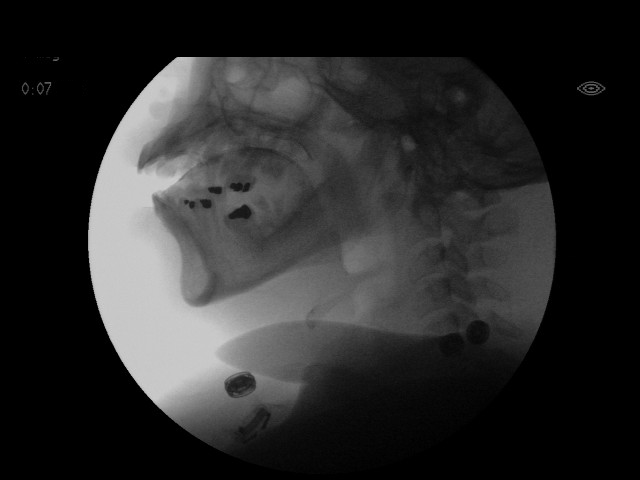
[im 5/13]
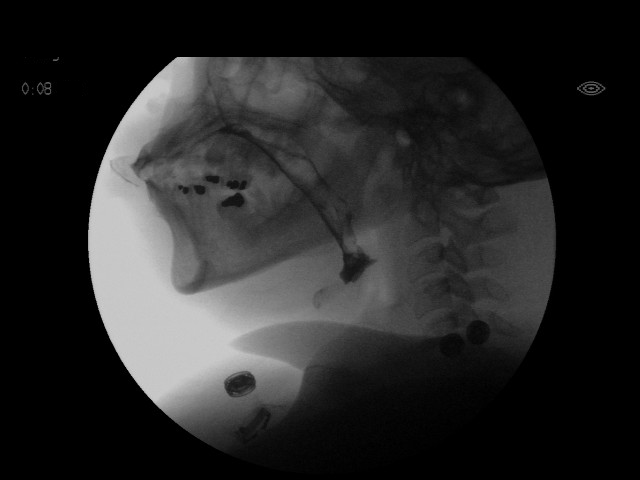
[im 5/13]
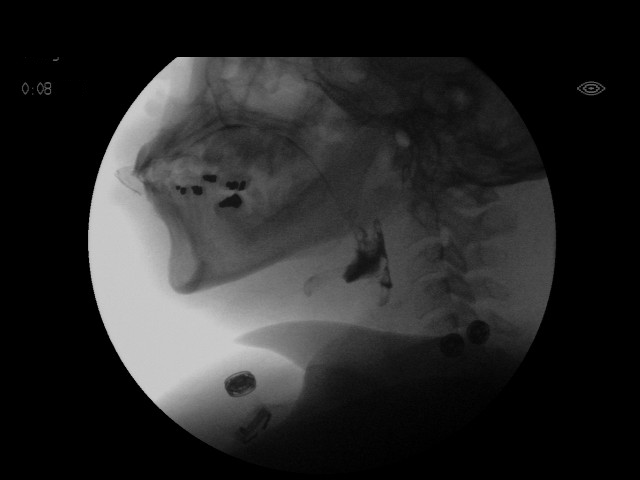
[im 6/13]
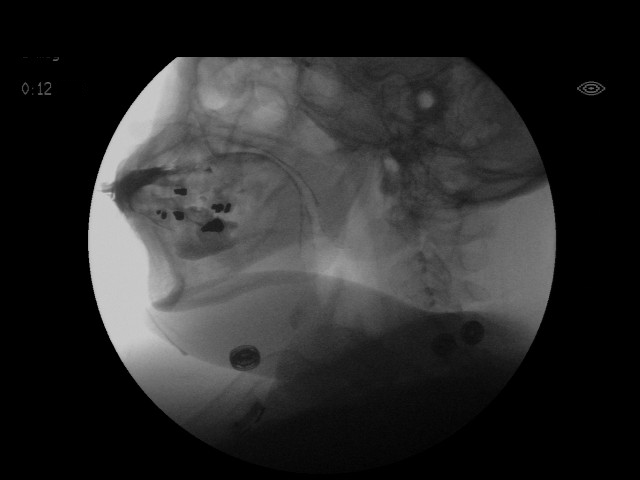
[im 7/13]
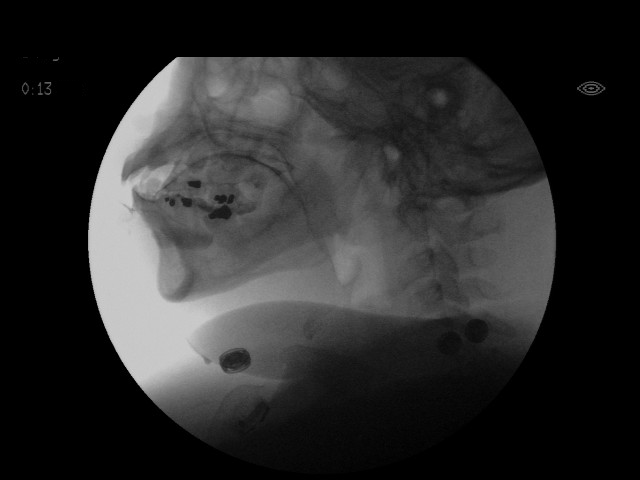
[im 7/13]
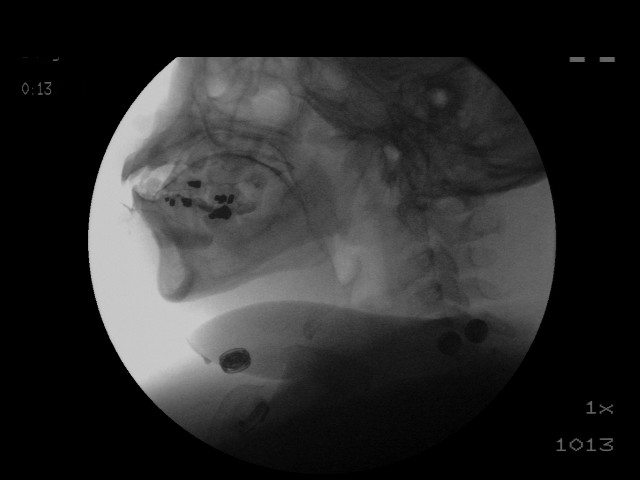
[im 8/13]
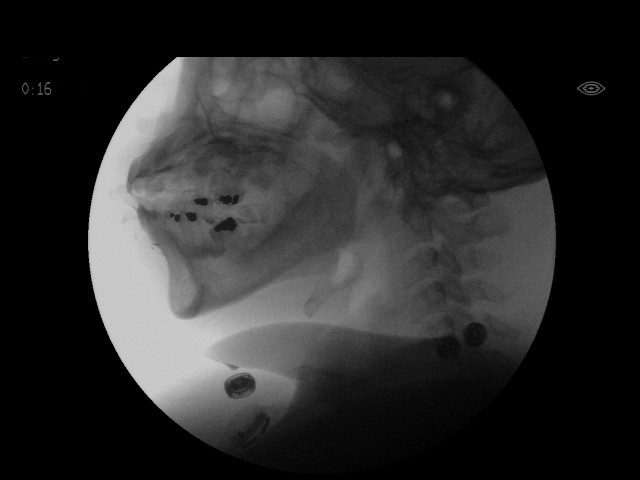
[im 9/13]
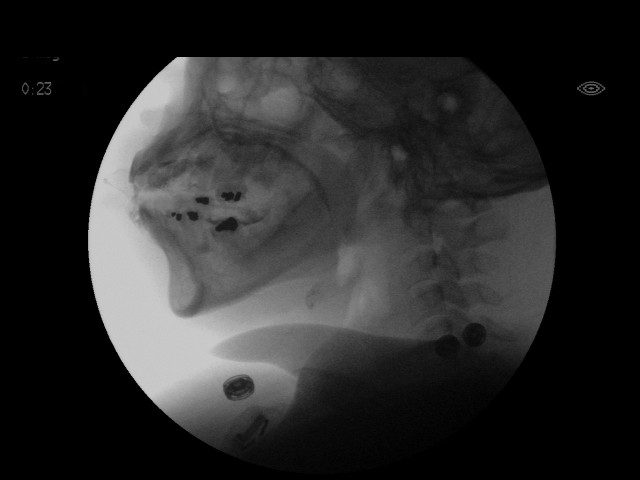
[im 10/13]
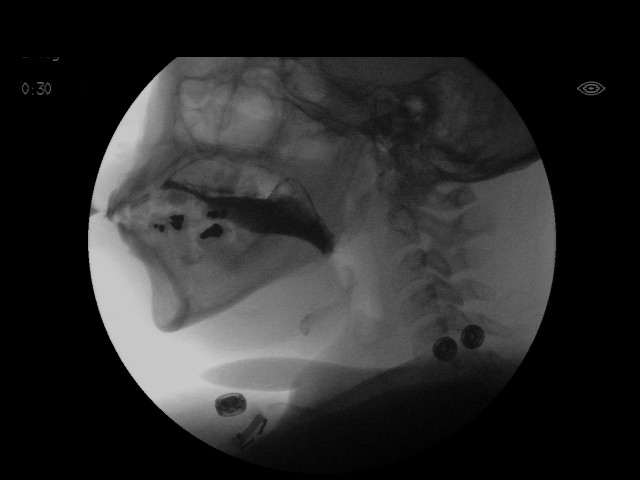
[im 11/13]
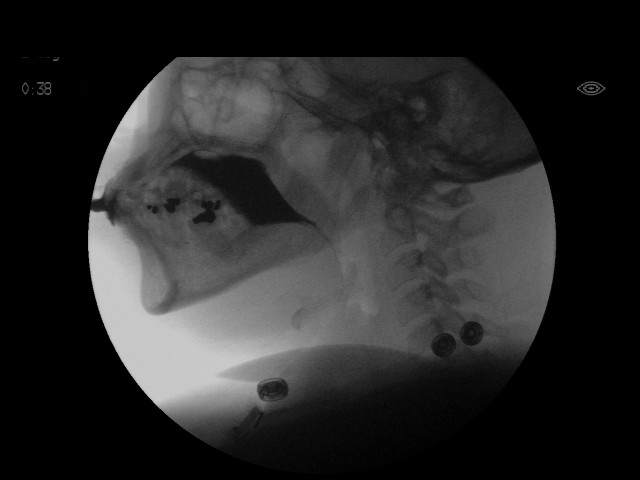
[im 11/13]
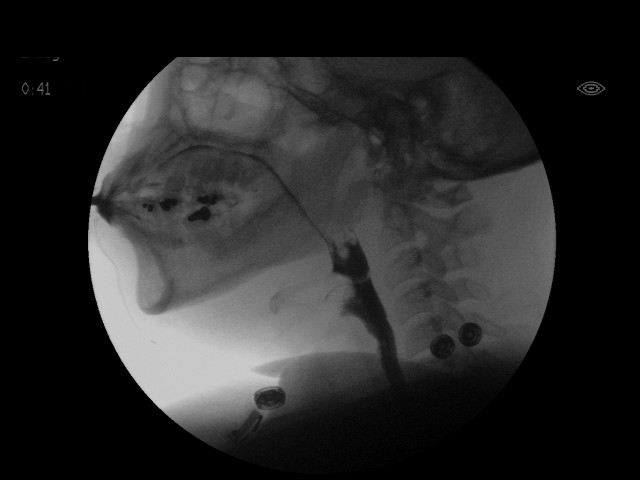
[im 12/13]
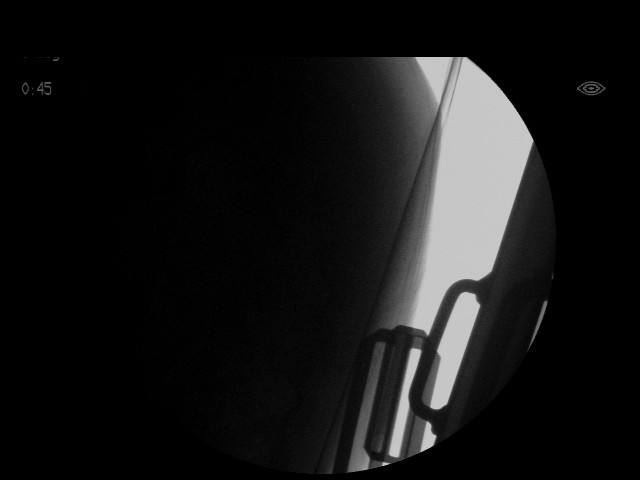
[im 13/13]
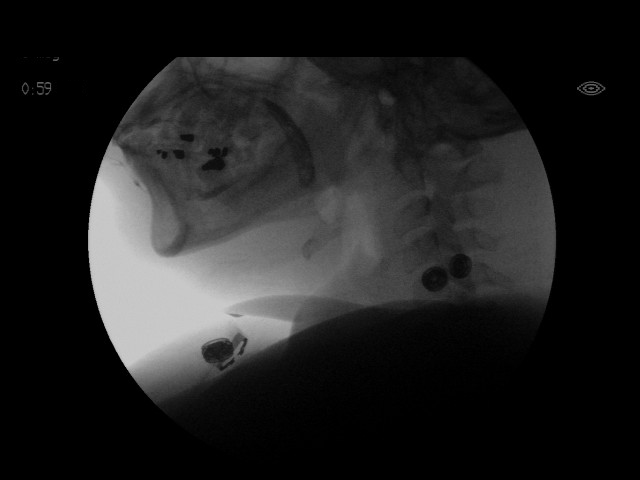
[im 13/13]
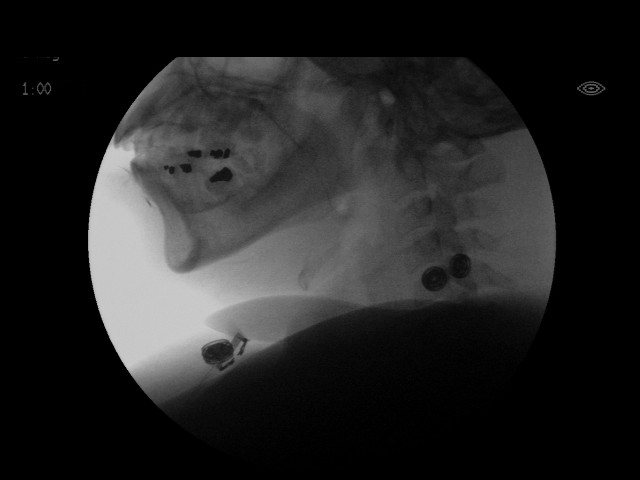

[18 of 24 positions shown; findings below may reference images not displayed]

FLUOROSCOPY FOR SWALLOWING FUNCTION STUDY:
Fluoroscopy was provided for swallowing function study, which was administered by a speech pathologist.  Final results and recommendations from this study are contained within the speech pathology report.

## 2016-04-06 MED ORDER — SODIUM CHLORIDE 0.9 % IV SOLN: INTRAVENOUS | Status: DC

## 2016-04-06 MED ORDER — BUTALBITAL-APAP-CAFFEINE 50-325-40 MG PO TABS
1.0000 | ORAL_TABLET | ORAL | Status: DC | PRN
  Administered 2016-04-06 – 2016-04-08 (×6): 1 via ORAL
  Filled 2016-04-06 (×6): qty 1

## 2016-04-06 MED ORDER — POTASSIUM CHLORIDE CRYS ER 20 MEQ PO TBCR
40.0000 meq | EXTENDED_RELEASE_TABLET | ORAL | Status: AC
  Administered 2016-04-06: 40 meq via ORAL
  Filled 2016-04-06: qty 2

## 2016-04-06 MED ORDER — PANTOPRAZOLE SODIUM 40 MG PO TBEC
40.0000 mg | DELAYED_RELEASE_TABLET | Freq: Every day | ORAL | Status: DC
  Administered 2016-04-06 – 2016-04-08 (×3): 40 mg via ORAL
  Filled 2016-04-06 (×3): qty 1

## 2016-04-06 MED ORDER — ENSURE ENLIVE PO LIQD
237.0000 mL | Freq: Two times a day (BID) | ORAL | Status: DC
  Administered 2016-04-08 (×2): 237 mL via ORAL
  Filled 2016-04-06 (×7): qty 237

## 2016-04-06 NOTE — Progress Notes (Signed)
Nutrition Follow-up  DOCUMENTATION CODES:   Obesity unspecified  INTERVENTION:   Ensure Enlive po BID, each supplement provides 350 kcal and 20 grams of protein  NUTRITION DIAGNOSIS:   Inadequate oral intake related to dysphagia as evidenced by meal completion < 50%. Ongoing.   GOAL:   Patient will meet greater than or equal to 90% of their needs Progressing.   MONITOR:   PO intake, Supplement acceptance, I & O's  ASSESSMENT:   45 year old female with hypertensive ICH.   4/23 Cortrak pulled out overnight, started on Dysphagia diet per SLP Per RN did not want to eat Breakfast this am but did drink all liquids. Lunch: Meal Completion: 25% Medications reviewed and include: senokot-s, Kdur Labs reviewed: potassium low 3.4 CBG's: 107-128  Diet Order:  DIET - DYS 1 Room service appropriate?: Yes; Fluid consistency:: Thin  Skin:  Reviewed, no issues  Last BM:  4/23  Height:   Ht Readings from Last 1 Encounters:  04/03/16 4\' 11"  (1.499 m)    Weight:   Wt Readings from Last 1 Encounters:  04/06/16 162 lb 4.1 oz (73.6 kg)    Ideal Body Weight:  45 kg  BMI:  Body mass index is 32.75 kg/(m^2).  Estimated Nutritional Needs:   Kcal:  1400-1600  Protein:  85-100 grams  Fluid:  >2 L/day  EDUCATION NEEDS:   No education needs identified at this time  [place], [place], [place] Pager [phone] After Hours Pager

## 2016-04-06 NOTE — Progress Notes (Signed)
Patient c/o headache and right neck muscle spasms; she has had three episodes since 1630; with movement she grabs her neck on the right side and grimaces; she will answer during the pain and or spasm; vital signs taken; MD paged for alternative prn medication for pain.

## 2016-04-06 NOTE — Care Management Note (Signed)
Case Management Note  Patient Details  Name: [name] MRN: [id number] Date of Birth: 13-Jun-1971  Subjective/Objective:                    Action/Plan: PT/OT recs are for CIR. CM continuing to follow for discharge needs.   Expected Discharge Date:                  Expected Discharge Plan:     In-House Referral:     Discharge planning Services     Post Acute Care Choice:    Choice offered to:     DME Arranged:    DME Agency:     HH Arranged:    [place] Agency:     Status of Service:     Medicare Important Message Given:    Date Medicare IM Given:    Medicare IM give by:    Date Additional Medicare IM Given:    Additional Medicare Important Message give by:     If discussed at [place] of Stay Meetings, dates discussed:    Additional Comments:  Pollie Friar, RN 04/06/2016, 3:14 PM

## 2016-04-06 NOTE — Procedures (Addendum)
Objective Swallowing Evaluation: Type of Study: Modified Barium Swallow Evaluation  Patient Details  Name: Abigail Campos MRN: [id number] Date of Birth: 10-Jan-1971  Today's Date: 04/06/2016 Time: SLP Start Time (ACUTE ONLY): 1019-SLP Stop Time (ACUTE ONLY): 1045 SLP Time Calculation (min) (ACUTE ONLY): 26 min  Past Medical History:  Past Medical History  Diagnosis Date  . Headache(784.0)     developed migraines after 2nd pregnancy, had a negative CT of the head  . Hypertension   . Essential hypertension 10/19/2007    03-2010: metoprolol changed to bystolic (was not feeling well on it, no specific allergy or reaction)     Past Surgical History:  Past Surgical History  Procedure Laterality Date  . Inguinal hernia repair      2004  . Cesarean section      [id number]  . Abdominal hysterectomy  11-14-07    no oophorectomy per surgical report   HPI: 45 y.o. female with h/o migraines and HTN, who presented to ED via EMS after peers observed acute onset of L facial asymmetry, R gaze and unresponsiveness. Pt had c/o headache throughout the day. CT Head 4/19 intraparenchymal hemorrhage arising from posterior, lateral R basal ganglia with subarachnoid hemorrhage extending into the sylvian fissure on R. There is mass effect with mild midline shift toward L of approximately 7 mm. Volume of hemorrhage in R basal ganglia resion is ~26 cubic cm. Note that there is additional hemorrhage in subarachnoid space on R which cannot be easily quantified with respect to specific volume of hemorrhage. No prior h/o SLP intervention.  Subjective: pt awake in chair  Assessment / Plan / Recommendation  CHL IP CLINICAL IMPRESSIONS 04/06/2016  Therapy Diagnosis Moderate oral phase dysphagia;Other (Comment)  Clinical Impression Pt presents with moderate oral sensorimotor dysphagia c/b decreased oral coordination due to lingual weakness and sensory deficits.  Oral delays in transiting noted with oral pocketing of  solids.  Lingual residuals noted with liquids clearing with cued dry swallow.  Pt transited barium tablet with thin = however due to oral lingual deficits - would not advise using.  No aspiration or frank laryngeal penetration noted.   Skilled intervention included educating pt to findings/reinforcing effective compensations using teach back.  Advised pt to advance diet to dys3/thin but she indicated desire to continue puree/thin.  Of note, pt complained of bladder spasm and demonstrated overt indicating discomfort.    Impact on safety and function Mild aspiration risk      CHL IP TREATMENT RECOMMENDATION 04/06/2016  Treatment Recommendations Therapy as outlined in treatment plan below     Prognosis 04/06/2016  Prognosis for Safe Diet Advancement Good  Barriers to Reach Goals Severity of deficits;Time post onset  Barriers/Prognosis Comment --    CHL IP DIET RECOMMENDATION 04/06/2016  SLP Diet Recommendations Thin liquid;Dysphagia 1 (Puree) solids  Liquid Administration via Cup;Straw  Medication Administration Whole meds with puree  Compensations Small sips/bites;Slow rate;Lingual sweep for clearance of pocketing  Postural Changes Other (Comment)      CHL IP OTHER RECOMMENDATIONS 04/06/2016  Recommended Consults --  Oral Care Recommendations Oral care before and after PO  Other Recommendations Have oral suction available      CHL IP FOLLOW UP RECOMMENDATIONS 04/05/2016  Follow up Recommendations Inpatient Rehab      CHL IP FREQUENCY AND DURATION 04/06/2016  Speech Therapy Frequency (ACUTE ONLY) min 2x/week  Treatment Duration 2 weeks           CHL IP ORAL PHASE 04/06/2016  Oral Phase  Impaired  Oral - Pudding Teaspoon --  Oral - Pudding Cup --  Oral - Honey Teaspoon --  Oral - Honey Cup --  Oral - Nectar Teaspoon --  Oral - Nectar Cup Weak lingual manipulation;Lingual pumping;Left anterior bolus loss;Reduced posterior propulsion;Lingual/palatal residue  Oral - Nectar Straw Weak  lingual manipulation;Lingual pumping;Reduced posterior propulsion;Lingual/palatal residue  Oral - Thin Teaspoon Weak lingual manipulation;Lingual pumping;Left anterior bolus loss;Reduced posterior propulsion;Lingual/palatal residue  Oral - Thin Cup Weak lingual manipulation;Lingual pumping;Left anterior bolus loss;Reduced posterior propulsion;Lingual/palatal residue  Oral - Thin Straw Weak lingual manipulation;Lingual pumping;Left anterior bolus loss;Reduced posterior propulsion;Lingual/palatal residue  Oral - Puree Weak lingual manipulation;Lingual pumping;Left anterior bolus loss;Reduced posterior propulsion;Left pocketing in lateral sulci  Oral - Mech Soft Weak lingual manipulation;Lingual pumping;Left anterior bolus loss;Premature spillage;Delayed oral transit;Reduced posterior propulsion;Lingual/palatal residue;Left pocketing in lateral sulci  Oral - Regular --  Oral - Multi-Consistency --  Oral - Pill Weak lingual manipulation;Lingual pumping;Left anterior bolus loss;Delayed oral transit;Premature spillage;Reduced posterior propulsion;Lingual/palatal residue  Oral Phase - Comment cued dry swallow assisted to decrease/clear oral residuals, oral suction used to clear oral pocketing    CHL IP PHARYNGEAL PHASE 04/06/2016  Pharyngeal Phase Impaired  Pharyngeal- Pudding Teaspoon --  Pharyngeal --  Pharyngeal- Pudding Cup --  Pharyngeal --  Pharyngeal- Honey Teaspoon --  Pharyngeal --  Pharyngeal- Honey Cup --  Pharyngeal --  Pharyngeal- Nectar Teaspoon --  Pharyngeal --  Pharyngeal- Nectar Cup WFL  Pharyngeal --  Pharyngeal- Nectar Straw --  Pharyngeal --  Pharyngeal- Thin Teaspoon WFL  Pharyngeal --  Pharyngeal- Thin Cup WFL  Pharyngeal --  Pharyngeal- Thin Straw WFL  Pharyngeal --  Pharyngeal- Puree WFL  Pharyngeal --  Pharyngeal- Mechanical Soft --  Pharyngeal --  Pharyngeal- Regular --  Pharyngeal --  Pharyngeal- Multi-consistency --  Pharyngeal --  Pharyngeal- Pill WFL   Pharyngeal --  Pharyngeal Comment --     CHL IP CERVICAL ESOPHAGEAL PHASE 04/06/2016  Cervical Esophageal Phase Impaired  Pudding Teaspoon --  Pudding Cup --  Honey Teaspoon --  Honey Cup --  Nectar Teaspoon --  Nectar Cup --  Nectar Straw --  Thin Teaspoon --  Thin Cup --  Thin Straw --  Puree --  Mechanical Soft --  Regular --  Multi-consistency --  Pill --  Cervical Esophageal Comment appearance of slower clearance at distal esophagus without pt awareness, radiologist not present to confirm    No flowsheet data found.  Janett Labella [place], [place] [facility] SLP [phone]

## 2016-04-06 NOTE — Consult Note (Signed)
Physical Medicine and Rehabilitation Consult Reason for Consult: Right basal ganglia hemorrhage secondary to hypertensive crisis Referring Physician: Dr.Xu   HPI: Abigail Campos is a 45 y.o. right handed female with history of hypertension and migraines. History taken from chart review. Lives with husband and has 2 children ages 79 and 78. One level home with level entry. Family members in the area to assist as needed. 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 Presented 04/01/2016 left facial droop with right gaze preference and headache. Systolic blood pressure greater than 200. Cranial CT scan showed focal area of hemorrhage arising in the posterior, lateral right basal ganglia measuring 4.5 x 3.4 x 3.4 cm with mild surrounding edema. Negative arterial findings on CTA of head and neck. Echocardiogram ejection fraction [id number] grade 2 diastolic dysfunction. Bouts of hypokalemia supplement added. Neurology consulted with conservative care. Follow-up CT of the head 04/04/2016 stable with slightly increased localized vasogenic edema. 4 mm right to left midline shift not significantly changed. Decreasing small volume acute subarachnoid hemorrhage. Dysphagia 1 thin liquid diet she pulled out her nasogastric tube. Close monitoring of blood pressure. Physical occupational therapy evaluations completed with recommendations of physical medicine rehabilitation consult.   Review of Systems  Unable to perform ROS: medical condition   Past Medical History  Diagnosis Date  . Headache(784.0)     developed migraines after 2nd pregnancy, had a negative CT of the head  . Hypertension   . Essential hypertension 10/19/2007    27-Mar-2010: metoprolol changed to bystolic (was not feeling well on it, no specific allergy or reaction)     Past Surgical History  Procedure Laterality Date  . Inguinal hernia repair      2003/03/28  . Cesarean section      [id number]  . Abdominal hysterectomy  11-14-07    no oophorectomy per  surgical report   Family History  Problem Relation Age of Onset  . Diabetes Mother   . Hypertension Mother   . Glaucoma Mother   . Heart disease      2 uncles died 03/28/11 from CAD-CHF  . Stroke Maternal Grandfather   . Colon cancer Mother     mother age 21  . Diabetes Other     siblings  . Hypertension Other     siblings   Social History:  Per reports, she has never smoked. She has never used smokeless tobacco. She reports that she does not drink alcohol or use illicit drugs. Allergies:  Allergies  Allergen Reactions  . Bee Venom Anaphylaxis  . Peanut-Containing Drug Products Anaphylaxis  . Hydrocodone Hives    Generalize itching w/o rash  . Ambien [Zolpidem Tartrate] Other (See Comments)    forgetfulness  . Aspirin Swelling    Reports blisters w/ ASA but pt reports is ok w/ Motrin, Advil, naproxen  . Latex Hives   Medications Prior to Admission  Medication Sig Dispense Refill  . Cholecalciferol (VITAMIN D PO) Take 1 tablet by mouth daily.     . cyclobenzaprine (FLEXERIL) 10 MG tablet Take 1 tablet (10 mg total) by mouth 3 (three) times daily as needed for muscle spasms. 21 tablet 0  . famotidine (PEPCID) 20 MG tablet One at bedtime (Patient taking differently: Take 20 mg by mouth at bedtime. ) 30 tablet 2  . felodipine (PLENDIL) 10 MG 24 hr tablet Take 1 tablet (10 mg total) by mouth daily. 30 tablet 4  . furosemide (LASIX) 20 MG tablet Take 1 tablet (20 mg total)  by mouth daily. 30 tablet 3  . Multiple Vitamin (MULTIVITAMIN) tablet Take 1 tablet by mouth 2 (two) times daily.      . Nebivolol HCl 20 MG TABS Take 1 tablet (20 mg total) by mouth 2 (two) times daily. 60 tablet 4  . pantoprazole (PROTONIX) 40 MG tablet Take 1 tablet (40 mg total) by mouth daily. 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  She fluctuates between min and mod assist for safety, balance and transitions.  Transfers General transfer comment: NT- Dr. Leonie Man only approved EOB today.       ADL: ADL Overall ADL's : Needs assistance/impaired Eating/Feeding: NPO Grooming: Wash/dry hands, Wash/dry face, Oral care, Brushing hair, Total assistance Grooming Details (indicate cue type and reason): Pt was able to demonstrate correct use of toothbrush but did not engage in further ADL activity as she fatigued  Upper Body Bathing: Total assistance, Sitting, Bed level Lower Body Bathing: Total assistance, Bed level Upper Body Dressing : Total assistance, Sitting, Bed level Lower Body Dressing: Total assistance, Bed level Toilet Transfer: Total assistance Toilet Transfer Details (indicate cue type and reason): unable to attempt this date  Functional mobility during ADLs: Total assistance, +2 for physical assistance (bed mobility )  Cognition: Cognition Overall Cognitive Status: Difficult to assess Arousal/Alertness: Lethargic Orientation Level: Oriented to person,  Oriented to situation, Disoriented to place, Disoriented to time Attention: Sustained Focused Attention: Appears intact Sustained Attention: Impaired Sustained Attention Impairment: Verbal basic, Functional basic Memory:  (to be assessed) Awareness: Impaired Awareness Impairment: Emergent impairment Problem Solving: Impaired Problem Solving Impairment: Functional basic Behaviors:  (suspect impulsivity once alertness improves) Safety/Judgment: Impaired Cognition Arousal/Alertness: Lethargic, Awake/alert Behavior During Therapy: Flat affect, Restless Overall Cognitive Status: Difficult to assess Area of Impairment: Attention, Following commands, Safety/judgement, Awareness, Problem solving Current Attention Level: Focused Following Commands: Follows one step commands inconsistently, Follows one step commands with increased time Safety/Judgement: Decreased awareness of safety, Decreased awareness of deficits Awareness: Intellectual Problem Solving: Requires tactile cues, Requires verbal cues, Decreased initiation, Difficulty sequencing, Slow processing General Comments: Pt follows one step commands ~30% of time with facilitation.  She spontaneously said "hey", but no other verbalization noted.  She is easily distracted Difficult to assess due to: Impaired communication  Blood pressure 141/90, pulse 73, temperature 98.3 F (36.8 C), temperature source Oral, resp. rate 20, height 4\' 11"  (1.499 m), weight 73.6 kg (162 lb 4.1 oz), SpO2 100 %. Physical Exam  Vitals reviewed. Constitutional: She appears well-developed.  Obese  HENT:  Head: Normocephalic and atraumatic.  Eyes: Conjunctivae and EOM are  normal.  Neck: Normal range of motion. Neck supple. No thyromegaly present.  Cardiovascular: Normal rate and regular rhythm.   Respiratory: Effort normal and breath sounds normal. No respiratory distress.  GI: Soft. Bowel sounds are normal. She exhibits no distension.  Musculoskeletal: She  exhibits edema. She exhibits no tenderness.  Neurological: She is alert.  Appears to have a right gaze preference with left neglect. Inconsistently follows one-step commands Expressive greater than receptive aphasia Severe dysarthria Left facial weakness DTRs symmetric Unable to assess sensation Motor:? Reliability, moving right upper and right lower extremity freely. Not moving left side.  Skin: Skin is warm and dry.  Psychiatric:  Unable to assess    Results for orders placed or performed during the hospital encounter of 04/01/16 (from the past 24 hour(s))  Glucose, capillary     Status: Abnormal   Collection Time: 04/05/16 12:32 PM  Result Value Ref Range   Glucose-Capillary 123 (H) 65 - 99 mg/dL  Glucose, capillary     Status: Abnormal   Collection Time: 04/05/16  3:55 PM  Result Value Ref Range   Glucose-Capillary 110 (H) 65 - 99 mg/dL  Glucose, capillary     Status: Abnormal   Collection Time: 04/05/16  7:39 PM  Result Value Ref Range   Glucose-Capillary 142 (H) 65 - 99 mg/dL  Glucose, capillary     Status: Abnormal   Collection Time: 04/05/16 11:27 PM  Result Value Ref Range   Glucose-Capillary 128 (H) 65 - 99 mg/dL  Glucose, capillary     Status: Abnormal   Collection Time: 04/06/16  3:17 AM  Result Value Ref Range   Glucose-Capillary 107 (H) 65 - 99 mg/dL  Magnesium     Status: None   Collection Time: 04/06/16  4:05 AM  Result Value Ref Range   Magnesium 2.2 1.7 - 2.4 mg/dL  Phosphorus     Status: None   Collection Time: 04/06/16  4:05 AM  Result Value Ref Range   Phosphorus 3.0 2.5 - 4.6 mg/dL  CBC     Status: Abnormal   Collection Time: 04/06/16  4:05 AM  Result Value Ref Range   WBC 12.1 (H) 4.0 - 10.5 K/uL   RBC 4.42 3.87 - 5.11 MIL/uL   Hemoglobin 12.7 12.0 - 15.0 g/dL   HCT 40.5 36.0 - 46.0 %   MCV 91.6 78.0 - 100.0 fL   MCH 28.7 26.0 - 34.0 pg   MCHC 31.4 30.0 - 36.0 g/dL   RDW 13.8 11.5 - 15.5 %   Platelets 240 150 - 400 K/uL  Basic metabolic  panel     Status: Abnormal   Collection Time: 04/06/16  4:05 AM  Result Value Ref Range   Sodium 140 135 - 145 mmol/L   Potassium 3.4 (L) 3.5 - 5.1 mmol/L   Chloride 105 101 - 111 mmol/L   CO2 27 22 - 32 mmol/L   Glucose, Bld 117 (H) 65 - 99 mg/dL   BUN 9 6 - 20 mg/dL   Creatinine, Ser 0.75 0.44 - 1.00 mg/dL   Calcium 8.7 (L) 8.9 - 10.3 mg/dL   GFR calc non Af Amer >60 >60 mL/min   GFR calc Af Amer >60 >60 mL/min   Anion gap 8 5 - 15  Glucose, capillary     Status: Abnormal   Collection Time: 04/06/16  8:14 AM  Result Value Ref Range   Glucose-Capillary 110 (H) 65 - 99 mg/dL   No results found.  Assessment/Plan: Diagnosis: Right  basal ganglia hemorrhage Labs and images independently reviewed.  Records reviewed and summated above. Stroke: Continue secondary stroke prophylaxis and Risk Factor Modification listed below:   Blood Pressure Management:  Continue current medication with prn's with permisive HTN per primary team Pre-diabetes management:   Left  sided hemiparesis: fit for orthotics to prevent contractures (resting hand splint for day, wrist cock up splint at night, PRAFO, etc) Motor recovery: Fluoxetine  1. Does the need for close, 24 hr/day medical supervision in concert with the patient's rehab needs make it unreasonable for this patient to be served in a less intensive setting? Yes  2. Co-Morbidities requiring supervision/potential complications: HTN (monitor and provide prns in accordance with increased physical exertion and pain), hypokalemia (continue to monitor, replete as necessary), dysphagia (continue SLP, consider vital stim, advance diet as tolerated), Bradycardia (monitor heart rate with increased physical activity, monitor for signs of increasing ICP), leukocytosis (cont to monitor for signs and symptoms of infection, further workup if indicated), migraines (ensure pain does not limit therapies) 3. Due to bladder management, safety, skin/wound care, disease  management, medication administration and patient education, does the patient require 24 hr/day rehab nursing? Yes 4. Does the patient require coordinated care of a physician, rehab nurse, PT (1-2 hrs/day, 5 days/week), OT (1-2 hrs/day, 5 days/week) and SLP (1-2 hrs/day, 5 days/week) to address physical and functional deficits in the context of the above medical diagnosis(es)? Yes Addressing deficits in the following areas: balance, endurance, locomotion, strength, transferring, bathing, dressing, feeding, grooming, toileting, cognition, speech, language, swallowing and psychosocial support 5. Can the patient actively participate in an intensive therapy program of at least 3 hrs of therapy per day at least 5 days per week? Potentially 6. The potential for patient to make measurable gains while on inpatient rehab is excellent 7. Anticipated functional outcomes upon discharge from inpatient rehab are TBD  with PT, TBD with OT, min assist with SLP. 8. Estimated rehab length of stay to reach the above functional goals is: 20-24 days. 9. Does the patient have adequate social supports and living environment to accommodate these discharge functional goals? Yes 10. Anticipated D/C setting: Home 11. Anticipated post D/C treatments: HH therapy and Home excercise program 12. Overall Rehab/Functional Prognosis: good and fair  RECOMMENDATIONS: This patient's condition is appropriate for continued rehabilitative care in the following setting: Will need to clarify caregiver support at discharge as well as await more thorough therapy evaluation prior to determining most appropriate discharge disposition. Patient has agreed to participate in recommended program. Potentially Note that insurance prior authorization may be required for reimbursement for recommended care.  Comment: Rehab Admissions Coordinator to follow up.  Delice Lesch, MD 04/06/2016

## 2016-04-06 NOTE — Care Management Note (Signed)
Case Management Note  Patient Details  Name: Abigail Campos MRN: [id number] Date of Birth: 12-06-1971  Subjective/Objective:  Pt admitted on 04/01/16 with Rt basal ganglia hemorrhage.  PTA, pt independent, lives with spouse.                   Action/Plan: PT/OT recommending CIR prior to dc home; MD please order rehab consult if you agree.  Thanks.    Expected Discharge Date:                  Expected Discharge Plan:  [place]  In-House Referral:     Discharge planning Services  CM Consult  Post Acute Care Choice:    Choice offered to:     DME Arranged:    DME Agency:     HH Arranged:    [place] Agency:     Status of Service:  In process, will continue to follow  Medicare Important Message Given:    Date Medicare IM Given:    Medicare IM give by:    Date Additional Medicare IM Given:    Additional Medicare Important Message give by:     If discussed at [place] of Stay Meetings, dates discussed:    Additional Comments:  Reinaldo Raddle, RN, BSN  Trauma/Neuro ICU Case Manager [phone]

## 2016-04-06 NOTE — Progress Notes (Signed)
VM left for patient's spouse regarding transfer to [place]

## 2016-04-06 NOTE — Progress Notes (Signed)
STROKE TEAM PROGRESS NOTE   SUBJECTIVE (INTERVAL HISTORY) No family is at the bedside. Her blood pressure is under control, off cleviprex. She passed swallow and put on po BP meds. More verbal and following commands. Has MBS today by speech.   OBJECTIVE Temp:  [97.7 F (36.5 C)-98.5 F (36.9 C)] 98.3 F (36.8 C) (04/24 0800) Pulse Rate:  [44-106] 61 (04/24 1137) Cardiac Rhythm:  [-] Normal sinus rhythm (04/24 1247) Resp:  [12-32] 18 (04/24 1137) BP: (121-193)/(75-121) 128/76 mmHg (04/24 1137) SpO2:  [96 %-100 %] 100 % (04/24 1137) Weight:  [162 lb 4.1 oz (73.6 kg)] 162 lb 4.1 oz (73.6 kg) (04/24 0409)  CBC:   Recent Labs Lab 04/01/16 1533  04/05/16 0739 04/06/16 0405  WBC 9.3  < > 15.0* 12.1*  NEUTROABS 4.3  --   --   --   HGB 13.4  < > 13.4 12.7  HCT 41.6  < > 42.8 40.5  MCV 90.2  < > 92.0 91.6  PLT 304  < > 250 240  < > = values in this interval not displayed.  Basic Metabolic Panel:   Recent Labs Lab 04/05/16 0500 04/05/16 0739 04/06/16 0405  NA  --  139 140  K  --  3.5 3.4*  CL  --  105 105  CO2  --  26 27  GLUCOSE  --  138* 117*  BUN  --  10 9  CREATININE  --  0.74 0.75  CALCIUM  --  8.6* 8.7*  MG 2.1  --  2.2  PHOS 2.3*  --  3.0    Lipid Panel:     Component Value Date/Time   CHOL 169 04/04/2016 0415   TRIG 66 04/05/2016 0739   HDL 37* 04/04/2016 0415   CHOLHDL 4.6 04/04/2016 0415   VLDL 19 04/04/2016 0415   LDLCALC 113* 04/04/2016 0415   HgbA1c:  Lab Results  Component Value Date   HGBA1C 6.0* 04/04/2016   Urine Drug Screen:     Component Value Date/Time   LABOPIA NONE DETECTED 04/01/2016 1806   COCAINSCRNUR NONE DETECTED 04/01/2016 1806   LABBENZ NONE DETECTED 04/01/2016 1806   AMPHETMU NONE DETECTED 04/01/2016 1806   THCU NONE DETECTED 04/01/2016 1806   LABBARB NONE DETECTED 04/01/2016 1806      IMAGING I have personally reviewed the radiological images below and agree with the radiology interpretations.  Ct Angio Head and neck  W/cm &/or Wo Cm  04/02/2016  IMPRESSION: 1. Negative arterial findings on CTA head and neck, aside from mild calcified atherosclerosis of the left ICA siphon. 2. Stable hyperdense intra-axial hemorrhage in the right hemisphere since yesterday. Negative CTA Spot Sign, and no evidence of associated vascular malformation. Estimated blood volume of 21 mL. Mildly increased surrounding edema, but stable mild regional mass effect. 3. Poor dentition.   Ct Head Wo Contrast  04/04/2016  IMPRESSION: 1. Stable right lentiform nucleus hemorrhage with slightly increased localized vasogenic edema. Localized mass effect with 4 mm right-to-left shift not significantly changed. 2. Decreasing small volume acute subarachnoid hemorrhage.  04/02/2016  IMPRESSION: Stable RIGHT putaminal intracerebral hemorrhage, favored to represent a hypertensive related event.    TTE - - Normal LV size and systolic function, EF 123456. Mild LV  hypertrophy. Moderate diastolic dysfunction. Normal RV size and  systolic function. No significant valvular abnormalities.    PHYSICAL EXAM  Temp:  [97.7 F (36.5 C)-98.5 F (36.9 C)] 98.3 F (36.8 C) (04/24 0800) Pulse Rate:  [44-106] 61 (  04/24 1137) Resp:  [12-32] 18 (04/24 1137) BP: (121-193)/(75-121) 128/76 mmHg (04/24 1137) SpO2:  [96 %-100 %] 100 % (04/24 1137) Weight:  [162 lb 4.1 oz (73.6 kg)] 162 lb 4.1 oz (73.6 kg) (04/24 0409)  General - Well nourished, well developed, in no apparent distress.  Ophthalmologic - Fundi not visualized due to noncooperation.  Cardiovascular - Regular rate and rhythm.  Mental Status -  Awake alert, follow simple commands, able to tell me her name, but still not orientated to age, time or place. Left neglect, not blinking to visual threat on the left. Right gaze preference, but able to cross midline. PERRL. Left facial droop. Left UE 0/5 increased tone, left LE 1/5 with decreased tone. RUE and RLE 5/5 with spontaneous movement. Sensation,  coordination and gait not tested.    ASSESSMENT/PLAN Abigail Campos is a 45 y.o. female with history of HA and HTN presenting with severe HA and L sided weakness. CT showed a R BG hemorrhage.   Stroke:  right basal ganglia hemorrhage most likely secondary to hypertensive emergency.  Resultant  Right gaze, left neglect, L hemiparesis  CT head  right BG hemorrhage   Repeat CT head stable hematoma  CTA head & neck negative for AVM or aneurysm   2D Echo EF 60-65%   SCDs for VTE prophylaxis DIET - DYS 1 Room service appropriate?: Yes; Fluid consistency:: Thin  No antithrombotic prior to admission, not on antithrombotics due to [place]  Ongoing aggressive stroke risk factor management  Therapy recommendations: pending  Disposition:  pending   Hypertensive Emergency  SBP >200 per EMS in setting of neurologic symptoms. 179/109 on admission.  Initially started on nicardipine with poor control, changed to Cleviprex with better control  Hx difficulty controlling BP per husband  SBP goal < 160   Off cleviprex today   On plendil and bystolic  Add lisinopril for BP control  RA ultrasound 03/2015 showed no stenosis.  Hyperlipidemia  LDL 113, goal < 70  No statin at home  add lipitor 20mg    Other Stroke Risk Factors  Obesity, Body mass index is 32.75 kg/(m^2).   Family hx stroke (maternal grandfather)  Hospital day # 5  This patient is critically ill due to right BG ICH and hypertensive emergency and at significant risk of neurological worsening, death form rebleeding, cerebral edema, brain herniation. This patient's care requires constant monitoring of vital signs, hemodynamics, respiratory and cardiac monitoring, review of multiple databases, neurological assessment, discussion with family, other specialists and medical decision making of high complexity. I spent 35 minutes of neurocritical care time in the care of this patient.  Rosalin Hawking, MD PhD Stroke  Neurology 04/06/2016 2:06 PM  To contact Stroke Continuity provider, please refer to [email or web address] After hours, contact General Neurology

## 2016-04-07 LAB — BASIC METABOLIC PANEL
Anion gap: 10 (ref 5–15)
BUN: 10 mg/dL (ref 6–20)
CO2: 26 mmol/L (ref 22–32)
Calcium: 8.9 mg/dL (ref 8.9–10.3)
Chloride: 101 mmol/L (ref 101–111)
Creatinine, Ser: 0.84 mg/dL (ref 0.44–1.00)
GFR calc Af Amer: >60 mL/min (ref >60)
GFR calc non Af Amer: >60 mL/min (ref >60)
Glucose, Bld: 120 mg/dL — ABNORMAL HIGH (ref 65–99)
Potassium: 3.3 mmol/L — ABNORMAL LOW (ref 3.5–5.1)
Sodium: 137 mmol/L (ref 135–145)

## 2016-04-07 LAB — CBC
HCT: 41.1 % (ref 36.0–46.0)
Hemoglobin: 13 g/dL (ref 12.0–15.0)
MCH: 28.8 pg (ref 26.0–34.0)
MCHC: 31.6 g/dL (ref 30.0–36.0)
MCV: 90.9 fL (ref 78.0–100.0)
Platelets: 262 10*3/uL (ref 150–400)
RBC: 4.52 MIL/uL (ref 3.87–5.11)
RDW: 13.5 % (ref 11.5–15.5)
WBC: 11.6 10*3/uL — ABNORMAL HIGH (ref 4.0–10.5)

## 2016-04-07 MED ORDER — CYCLOBENZAPRINE HCL 10 MG PO TABS
5.0000 mg | ORAL_TABLET | Freq: Three times a day (TID) | ORAL | Status: DC | PRN
  Administered 2016-04-07 – 2016-04-08 (×2): 5 mg via ORAL
  Filled 2016-04-07 (×2): qty 1

## 2016-04-07 MED ORDER — HYDRALAZINE HCL 10 MG PO TABS
10.0000 mg | ORAL_TABLET | Freq: Three times a day (TID) | ORAL | Status: DC
  Administered 2016-04-07 (×2): 10 mg via ORAL
  Filled 2016-04-07 (×2): qty 1

## 2016-04-07 MED ORDER — HEPARIN SODIUM (PORCINE) 5000 UNIT/ML IJ SOLN
5000.0000 [IU] | Freq: Three times a day (TID) | INTRAMUSCULAR | Status: DC
  Administered 2016-04-08 (×3): 5000 [IU] via SUBCUTANEOUS
  Filled 2016-04-07 (×3): qty 1

## 2016-04-07 MED ORDER — HYDRALAZINE HCL 25 MG PO TABS
25.0000 mg | ORAL_TABLET | Freq: Three times a day (TID) | ORAL | Status: DC
  Administered 2016-04-08 (×2): 25 mg via ORAL
  Filled 2016-04-07 (×2): qty 1

## 2016-04-07 NOTE — Progress Notes (Addendum)
Occupational Therapy Treatment Patient Details Name: Abigail Campos MRN: [id number] DOB: Apr 06, 1971 Today's Date: 04/07/2016    History of present illness 45 y.o. female admitted to [facility] on 04/01/16 with HA, left facial droop, right gaze, and not talking eventually becoming unresponsive.  Pt dx with BG ICH.  Pt with significant PMHx of HTN, HA.     OT comments  Pt progressing. Continue to recommend CIR for rehab. Pt participated in ADLs in session and was overall Max-Total A for grooming, bathing, and dressing tasks.  Follow Up Recommendations  CIR    Equipment Recommendations  3 in 1 bedside comode;Tub/shower bench    Recommendations for Other Services Rehab consult    Precautions / Restrictions Precautions Precautions: Fall Precaution Comments: left hemi Restrictions Weight Bearing Restrictions: No       Mobility Bed Mobility Overal bed mobility: +2 for physical assistance;Needs Assistance Bed Mobility: Supine to Sit;Sit to Supine     Supine to sit: +2 for physical assistance;Max assist Sit to supine: +2 for physical assistance;Max assist   General bed mobility comments: assist with LEs and trunk to come to sitting position. Assist with trunk and LEs when returning to bed.   Transfers Overall transfer level: Needs assistance Equipment used: None Transfers: Sit to/from Stand Sit to Stand: +2 physical assistance;Mod assist   General transfer comment: Used back of recliner chair for standing    Balance Mod-Max assist for sitting balance EOB.              ADL Overall ADL's : Needs assistance/impaired     Grooming: Oral care;Wash/dry face;Brushing hair;Bed level;Sitting;Maximal assistance (Mod assist for balance sitting EOB; assisted with holding basin for her to spit in and assist for getting toothpaste on toothbrush)  Grooming Details (indicate cue type and reason): washed face in bed; other tasks sitting EOB Upper Body Bathing: Sitting;Total assistance  (sitting EOB; washed armpits) Upper Body Bathing Details (indicate cue type and reason): Max assist for balance and assist for lifting LUE to wash it     Upper Body Dressing : Sitting;Maximal assistance (sitting EOB) Upper Body Dressing Details (indicate cue type and reason): Mod assist for balance and also assist with gown     Toilet Transfer: +2 for physical assistance;Moderate assistance (sit to stand from bed)           Functional mobility during ADLs: +2 for physical assistance;Moderate assistance (sit to stand from bed) General ADL Comments: Recommended pt's mom sit on pt's left side. OT placed grooming items on pt's left to try to get her to attend to left side. Pt able to get oral care items from left and also able to retrieve hairbrush on left after cues to look around.       Vision                     Perception     Praxis      Cognition  Lethargic but became awake in session Behavior During Therapy:  (tearful) Overall Cognitive Status: Difficult to assess due to impaired communication; slow processing and sustained attention     Extremity/Trunk Assessment               Exercises     Shoulder Instructions       General Comments      Pertinent Vitals/ Pain       Pain Assessment: Faces Faces Pain Scale: Hurts little more Pain Location: neck  Pain Descriptors / Indicators: Grimacing  Pain Intervention(s): Monitored during session  Home Living                                          Prior Functioning/Environment              Frequency Min 3X/week     Progress Toward Goals  OT Goals(current goals can now be found in the care plan section)  Progress towards OT goals: Progressing toward goals  Acute Rehab OT Goals Patient Stated Goal: not stated OT Goal Formulation: With family Time For Goal Achievement: 04/17/16 Potential to Achieve Goals: Good ADL Goals Pt Will Perform Grooming: with min assist;sitting Pt  Will Perform Upper Body Bathing: with min assist;sitting Pt Will Transfer to Toilet: with mod assist;squat pivot transfer;bedside commode Additional ADL Goal #1: Pt will sustain attention to familiar ADL task x 3 mins with min cues  Additional ADL Goal #2: Pt will locate items on Lt with min cues during ADL tasks Additional ADL Goal #3: Family will be independent with PROM Lt UE  Plan Discharge plan remains appropriate    Co-evaluation    PT/OT/SLP Co-Evaluation/Treatment: Yes Reason for Co-Treatment: For patient/therapist safety   OT goals addressed during session: ADL's and self-care;Other (comment) (mobility)      End of Session Equipment Utilized During Treatment: Gait belt   Activity Tolerance Patient limited by fatigue   Patient Left with call bell/phone within reach;with family/visitor present;in bed;with bed alarm set   Nurse Communication          Time: 1422-1450 OT Time Calculation (min): 28 min  Charges: OT General Charges $OT Visit: 1 Procedure OT Treatments $Self Care/Home Management : 8-22 mins  Benito Mccreedy OTR/L [id number] 04/07/2016, 3:53 PM

## 2016-04-07 NOTE — Progress Notes (Signed)
Physical Therapy Treatment Patient Details Name: Abigail Campos MRN: [id number] DOB: 03-27-1971 Today's Date: 04/07/2016    History of Present Illness 45 y.o. female admitted to [facility] on 04/01/16 with HA, left facial droop, right gaze, and not talking eventually becoming unresponsive.  Pt dx with BG ICH.  Pt with significant PMHx of HTN, HA.      PT Comments    Pt is progressing with OOB mobility today and was able to transfer in standing OOB to recliner chair.  She is not yet safe enough to stay up in recliner chair and is at the far end of the hall from RN station.  I discussed with RN possible pt being ready tomorrow if family is in room supervising her with emphasis on making sure they alert staff when/if they leave her alone.  PT still strongly recommending CIR level therapies at discharge. PT to follow acutely until d/c confirmed.     Follow Up Recommendations  CIR     Equipment Recommendations  Wheelchair (measurements PT);Wheelchair cushion (measurements PT);3in1 (PT);Hospital bed    Recommendations for Other Services  NA     Precautions / Restrictions Precautions Precautions: Fall Precaution Comments: dense left hemi and decreased safety awareness    Mobility  Bed Mobility Overal bed mobility: +2 for physical assistance;Needs Assistance Bed Mobility: Supine to Sit;Sit to Supine     Supine to sit: +2 for physical assistance;Mod assist Sit to supine: +2 for physical assistance;Mod assist   General bed mobility comments: Two person mod assist to get to EOB out of left side of bed.  Assist needed to help right hand find left bed rail and to support trunk during transition up to sit from weak side.  Pt moving her right leg over EOB and starting to sit up before left leg can be positioned in a safe place.    Transfers Overall transfer level: Needs assistance Equipment used: None Transfers: Sit to/from [facility] Sit to Stand: +2 physical assistance;Mod  assist;Max assist Stand pivot transfers: +2 physical assistance;Max assist;Mod assist       General transfer comment: Two person mod to max assistance to support trunk, facilitate hip extension and weight shift and block left leg/knee.  Pt taking 1-2 small pivotal steps around the the recliner chair positioned on her right side.  Preformed transfer x 2 to recliner and from recliner to bed both towards her right side.   Ambulation/Gait             General Gait Details: unable at this time    Modified Rankin (Stroke Patients Only) Pre-Morbid Rankin Score: No symptoms Modified Rankin: Severe disability     Balance Overall balance assessment: Needs assistance Sitting-balance support: Feet supported;Single extremity supported Sitting balance-Leahy Scale: Poor Sitting balance - Comments: Needs fluctuating levels of assist, more upon inital sitting, less later.  When alllowed to fall to the left, pt unable to say which way she is leaning.  Postural control: Left lateral lean Standing balance support: Single extremity supported Standing balance-Leahy Scale: Poor Standing balance comment: poor to zero depending on fatigue                    Cognition Arousal/Alertness: Awake/alert (better EOB) Behavior During Therapy: Restless;Impulsive (pt tearful at times during session) Overall Cognitive Status: Impaired/Different from baseline Area of Impairment: Attention;Following commands;Safety/judgement;Awareness;Problem solving   Current Attention Level: Focused   Following Commands: Follows one step commands inconsistently;Follows one step commands with increased time Safety/Judgement: Decreased  awareness of safety;Decreased awareness of deficits Awareness: Intellectual Problem Solving: Difficulty sequencing;Requires verbal cues;Requires tactile cues General Comments: Pt following some basic one step commands ~25% of time, verbalizing more today, however, very impulsive.  I  think she is starting to have a better understanding of how significant her current situation is as she is tearful at times.  When asked, " can you get back into the bed without help?" after two person assist to the recliner chair she thought for a moment and said, "yes".  When asked could she walk to the bathroom by herself she thought for a moment and stated, "No, but I would try"       General Comments General comments (skin integrity, edema, etc.): In sitting worked on finding things on her left side, pt better able to bring both head and gaze past midline today.      Pertinent Vitals/Pain Pain Assessment: Faces Faces Pain Scale: Hurts little more Pain Location: head, grabbing for head at times during session Pain Descriptors / Indicators: Guarding;Grimacing Pain Intervention(s): Limited activity within patient's tolerance;Monitored during session;Repositioned           PT Goals (current goals can now be found in the care plan section) Acute Rehab PT Goals Patient Stated Goal: Family would like to do what is best for her. Progress towards PT goals: Progressing toward goals    Frequency  Min 4X/week    PT Plan Current plan remains appropriate       End of Session Equipment Utilized During Treatment: Gait belt Activity Tolerance: Patient limited by fatigue Patient left: in bed;with call bell/phone within reach;with bed alarm set     Time: 1354-1429 PT Time Calculation (min) (ACUTE ONLY): 35 min  Charges:  $Therapeutic Activity: 23-37 mins                      Janilah Hojnacki B. [place], [place], DPT [phone]   04/07/2016, 2:15 PM

## 2016-04-07 NOTE — Progress Notes (Signed)
Rehab admissions - I met with patient and her husband.  Husband says that he, patient's sister/mom/dad will share caring for patient after rehab.  Patient has [employer].  I have called and requested acute inpatient rehab admission.  Will await call back from insurance carrier.  Call me for questions.  #[phone]

## 2016-04-07 NOTE — Progress Notes (Signed)
Patient's family request muscle spasm medication at this time. MD notified. Awaiting for order.  Ave Filter, RN

## 2016-04-07 NOTE — Progress Notes (Signed)
STROKE TEAM PROGRESS NOTE   SUBJECTIVE (INTERVAL HISTORY) Husband at bedside. Patient complains of pain. Moaning, not responding to questions. Open eyes on voice though. Still has left hemiplegia.    OBJECTIVE Temp:  [97.5 F (36.4 C)-99 F (37.2 C)] 98.3 F (36.8 C) (04/25 0939) Pulse Rate:  [50-60] 53 (04/25 0939) Cardiac Rhythm:  [-] Sinus bradycardia (04/25 0701) Resp:  [16-20] 16 (04/25 0939) BP: (134-174)/(71-96) 139/80 mmHg (04/25 0939) SpO2:  [98 %-100 %] 100 % (04/25 0939)  CBC:   Recent Labs Lab 04/01/16 1533  04/06/16 0405 04/07/16 0440  WBC 9.3  < > 12.1* 11.6*  NEUTROABS 4.3  --   --   --   HGB 13.4  < > 12.7 13.0  HCT 41.6  < > 40.5 41.1  MCV 90.2  < > 91.6 90.9  PLT 304  < > 240 262  < > = values in this interval not displayed.  Basic Metabolic Panel:   Recent Labs Lab 04/05/16 0500  04/06/16 0405 04/07/16 0440  NA  --   < > 140 137  K  --   < > 3.4* 3.3*  CL  --   < > 105 101  CO2  --   < > 27 26  GLUCOSE  --   < > 117* 120*  BUN  --   < > 9 10  CREATININE  --   < > 0.75 0.84  CALCIUM  --   < > 8.7* 8.9  MG 2.1  --  2.2  --   PHOS 2.3*  --  3.0  --   < > = values in this interval not displayed.  Lipid Panel:     Component Value Date/Time   CHOL 169 04/04/2016 0415   TRIG 66 04/05/2016 0739   HDL 37* 04/04/2016 0415   CHOLHDL 4.6 04/04/2016 0415   VLDL 19 04/04/2016 0415   LDLCALC 113* 04/04/2016 0415   HgbA1c:  Lab Results  Component Value Date   HGBA1C 6.0* 04/04/2016   Urine Drug Screen:     Component Value Date/Time   LABOPIA NONE DETECTED 04/01/2016 1806   COCAINSCRNUR NONE DETECTED 04/01/2016 1806   LABBENZ NONE DETECTED 04/01/2016 1806   AMPHETMU NONE DETECTED 04/01/2016 1806   THCU NONE DETECTED 04/01/2016 1806   LABBARB NONE DETECTED 04/01/2016 1806      IMAGING  Ct Angio Head and neck W/cm &/or Wo Cm 04/02/2016  IMPRESSION: 1. Negative arterial findings on CTA head and neck, aside from mild calcified  atherosclerosis of the left ICA siphon. 2. Stable hyperdense intra-axial hemorrhage in the right hemisphere since yesterday. Negative CTA Spot Sign, and no evidence of associated vascular malformation. Estimated blood volume of 21 mL. Mildly increased surrounding edema, but stable mild regional mass effect. 3. Poor dentition.   Ct Head Wo Contrast 04/04/2016  IMPRESSION: 1. Stable right lentiform nucleus hemorrhage with slightly increased localized vasogenic edema. Localized mass effect with 4 mm right-to-left shift not significantly changed. 2. Decreasing small volume acute subarachnoid hemorrhage.  04/02/2016  IMPRESSION: Stable RIGHT putaminal intracerebral hemorrhage, favored to represent a hypertensive related event.    TTE -  - Normal LV size and systolic function, EF 123456. Mild LV hypertrophy. Moderate diastolic dysfunction. Normal RV size and systolic function. No significant valvular abnormalities.   PHYSICAL EXAM General - Well nourished, well developed, in no apparent distress.  Ophthalmologic - Fundi not visualized due to noncooperation.  Cardiovascular - Regular rate and rhythm.  Mental  Status -  Awake alert, follow simple commands, able to tell me her name, but still not orientated to age, time or place. Left neglect, not blinking to visual threat on the left. Right gaze preference, but able to cross midline. PERRL. Left facial droop. Left UE 0/5 increased tone, left LE 1/5 with decreased tone. RUE and RLE 5/5 with spontaneous movement. Sensation, coordination and gait not tested.    ASSESSMENT/PLAN [name] is a 45 y.o. female with history of HA and HTN presenting with severe HA and L sided weakness. CT showed a R BG hemorrhage.   Stroke:  right basal ganglia hemorrhage most likely secondary to hypertensive emergency.  Resultant  Right gaze, left neglect, L hemiparesis  CT head  right BG hemorrhage   Repeat CT head stable hematoma  CTA head & neck  negative for AVM or aneurysm   2D Echo EF 60-65%   subq heparin for VTE prophylaxis DIET - DYS 1 Room service appropriate?: Yes; Fluid consistency:: Thin  No antithrombotic prior to admission, not on antithrombotics due to [place]  Ongoing aggressive stroke risk factor management  Therapy recommendations: CIR. They are following.   Disposition:  pending   Hypertensive Emergency  SBP >200 per EMS in setting of neurologic symptoms. 179/109 on admission.  Initially started on nicardipine with poor control, changed to Cleviprex with better control  Hx difficulty controlling BP per husband  SBP goal < 160   Off cleviprex  On plendil and bystolic and lisinopril for BP control  Added hydralazine for better BP control  RA ultrasound 03/2015 showed no stenosis  Hyperlipidemia  LDL 113, goal < 70  No statin at home  added lipitor 20mg    Other Stroke Risk Factors  Obesity, Body mass index is 32.75 kg/(m^2).   Family hx stroke (maternal grandfather)  Husband gave FMLA papers to be completed to team - 2 sets for him, 2 for her.  Hospital day # [place] for Pager information 04/07/2016 11:40 AM   I, the attending vascular neurologist, have personally obtained a history, examined the patient, evaluated laboratory data, individually viewed imaging studies and agree with radiology interpretations. I also discussed with husband and regarding her care plan. Together with the NP/PA, we formulated the assessment and plan of care which reflects our mutual decision.  I have made any additions or clarifications directly to the above note and agree with the findings and plan as currently documented.   BP still not in good control. Add hydralazine and give her quit time. Pending CIR.   Rosalin Hawking, MD PhD Stroke Neurology 04/07/2016 10:48 PM       To contact Stroke Continuity provider, please refer to [email or web address] After hours, contact  General Neurology

## 2016-04-07 NOTE — Progress Notes (Signed)
Speech Language Pathology Treatment: Dysphagia  Patient Details Name: Abigail Campos MRN: [id number] DOB: 02-12-1971 Today's Date: 04/07/2016 Time: [id number] SLP Time Calculation (min) (ACUTE ONLY): 15 min  Assessment / Plan / Recommendation Clinical Impression  Pt continues with impulsivity and c/o pain - called to make RN aware of pt report of neck spasm.  SLP educated her and her spouse to dietary recommendations and compensation strategies.  Encouraged advancement in diet for pt satisfaction and to improve intake.  Requested pt self feed to maximize neuro input for airway protection.    Pt consumed apple juice, peaches *chopped, and 2 bites of graham cracker.  Decreased mastication skills noted but no indications of airway compromise.  She did require max cues to slow rate and masticate well.   Pt making progress slowly - advised to only have her eat when able to concentrate and pain is manageable.  Oral holding and multiple respiration cycles noted when pt in pain which will increase risk of inhaling foods.  Spouse assured SLP he will encourage pt to use caution and SLP observed him facilitating her swallowing.  dietary advancement.     Recommend advance diet to dys2/thin with strict precautions.  Pt will continue to require skilled SLP for cognitive linguistic and swallowing rehab.     HPI HPI: 45 y.o. female with h/o migraines and HTN, who presented to ED via EMS after peers observed acute onset of L facial asymmetry, R gaze and unresponsiveness. Pt had c/o headache throughout the day. CT Head 4/19 intraparenchymal hemorrhage arising from posterior, lateral R basal ganglia with subarachnoid hemorrhage extending into the sylvian fissure on R. There is mass effect with mild midline shift toward L of approximately 7 mm. Volume of hemorrhage in R basal ganglia resion is ~26 cubic cm. Note that there is additional hemorrhage in subarachnoid space on R which cannot be easily quantified with  respect to specific volume of hemorrhage. No prior h/o SLP intervention.      SLP Plan  Continue with current plan of care     Recommendations  Diet recommendations: Dysphagia 2 (fine chop);Thin liquid Liquids provided via: Cup;Straw Medication Administration: Whole meds with puree Supervision: Patient able to self feed;Full supervision/cueing for compensatory strategies Compensations: Slow rate;Small sips/bites;Follow solids with liquid;Lingual sweep for clearance of pocketing Postural Changes and/or Swallow Maneuvers: Seated upright 90 degrees;Upright 30-60 min after meal             Oral Care Recommendations: Oral care BID Follow up Recommendations: Inpatient Rehab Plan: Continue with current plan of care     [place], [place] [facility] SLP [phone]

## 2016-04-07 NOTE — H&P (Addendum)
Physical Medicine and Rehabilitation Admission H&P    Chief Complaint  Patient presents with  . code stroke   : HPI: Abigail Campos is a 45 y.o. right handed female with history of hypertension and migraines. History taken from chart review. Lives with husband and has 2 children ages 4 and 62. One level home with level entry. Family members in the area to assist as needed. 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 Presented 04/01/2016 left facial droop with right gaze preference and headache. Systolic blood pressure greater than 200. Cranial CT scan showed focal area of hemorrhage arising in the posterior, lateral right basal ganglia measuring 4.5 x 3.4 x 3.4 cm with mild surrounding edema. Negative arterial findings on CTA of head and neck. Echocardiogram ejection fraction 41% grade 2 diastolic dysfunction. Bouts of hypokalemia supplement added. Neurology consulted with conservative care. Follow-up CT of the head 04/04/2016 stable with slightly increased localized vasogenic edema. 4 mm right to left midline shift not significantly changed. Decreasing small volume acute subarachnoid hemorrhage. Dysphagia 2 thin liquid diet . Close monitoring of blood pressure.Subcutaneous heparin for DVT prophylaxis initiated 04/07/2016 PhysicalAnd occupational therapy evaluations completed with recommendations of physical medicine rehabilitation consult.Patient was admitted for a comprehensive rehabilitation program  ROS Review of Systems  Unable to perform ROS: medical condition    Past Medical History  Diagnosis Date  . Headache(784.0)     developed migraines after 2nd pregnancy, had a negative CT of the head  . Hypertension   . Essential hypertension 10/19/2007    03-2010: metoprolol changed to bystolic (was not feeling well on it, no specific allergy or reaction)     Past Surgical History  Procedure Laterality Date  . Inguinal hernia repair      02/21/03  . Cesarean section      [id number]  . Abdominal  hysterectomy  11-14-07    no oophorectomy per surgical report   Family History  Problem Relation Age of Onset  . Diabetes Mother   . Hypertension Mother   . Glaucoma Mother   . Heart disease      2 uncles died 2011/02/21 from CAD-CHF  . Stroke Maternal Grandfather   . Colon cancer Mother     mother age 82  . Diabetes Other     siblings  . Hypertension Other     siblings   Social History:  reports that she has never smoked. She has never used smokeless tobacco. She reports that she does not drink alcohol or use illicit drugs. Allergies:  Allergies  Allergen Reactions  . Bee Venom Anaphylaxis  . Peanut-Containing Drug Products Anaphylaxis  . Hydrocodone Hives    Generalize itching w/o rash  . Ambien [Zolpidem Tartrate] Other (See Comments)    forgetfulness  . Aspirin Swelling    Reports blisters w/ ASA but pt reports is ok w/ Motrin, Advil, naproxen  . Latex Hives   Medications Prior to Admission  Medication Sig Dispense Refill  . Cholecalciferol (VITAMIN D PO) Take 1 tablet by mouth daily.     . cyclobenzaprine (FLEXERIL) 10 MG tablet Take 1 tablet (10 mg total) by mouth 3 (three) times daily as needed for muscle spasms. 21 tablet 0  . famotidine (PEPCID) 20 MG tablet One at bedtime (Patient taking differently: Take 20 mg by mouth at bedtime. ) 30 tablet 2  . felodipine (PLENDIL) 10 MG 24 hr tablet Take 1 tablet (10 mg total) by mouth daily. 30 tablet 4  . furosemide (LASIX) 20  MG tablet Take 1 tablet (20 mg total) by mouth daily. 30 tablet 3  . Multiple Vitamin (MULTIVITAMIN) tablet Take 1 tablet by mouth 2 (two) times daily.      . Nebivolol HCl 20 MG TABS Take 1 tablet (20 mg total) by mouth 2 (two) times daily. 60 tablet 4  . pantoprazole (PROTONIX) 40 MG tablet Take 1 tablet (40 mg total) by mouth daily. Take 30-60 min before first meal of the day 30 tablet 2    Home: [place] expects to be discharged to:: Private residence Living Arrangements:  Spouse/significant other, Children (10/18 y.o children) Available Help at Discharge: Family Type of Home: House Home Access: Level entry Home Layout: One level Bathroom Shower/Tub: Producer, television/film/video: None Additional Comments: husband to take FMLA, and other family available as needed to help out  Lives With: Spouse   Functional History: Prior Function Level of Independence: Independent 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  Functional Status:  Mobility: Bed Mobility Overal bed mobility: Needs Assistance, + 2 for safety/equipment, +2 for physical assistance Bed Mobility: Supine to Sit, Sit to Supine Supine to sit: +2 for safety/equipment, +2 for physical assistance, Min assist, Mod assist Sit to supine: Min assist, Mod assist, +2 for physical assistance, +2 for safety/equipment General bed mobility comments: Pt tends to over shoot her intended direction moving quickly both into and out of bed.  She fluctuates between min and mod assist for safety, balance and transitions.  Transfers General transfer comment: NT- Dr. Leonie Man only approved EOB today.       ADL: ADL Overall ADL's : Needs assistance/impaired Eating/Feeding: NPO Grooming: Wash/dry hands, Wash/dry face, Oral care, Brushing hair, Total assistance Grooming Details (indicate cue type and reason): Pt was able to demonstrate correct use of toothbrush but did not engage in further ADL activity as she fatigued  Upper Body Bathing: Total assistance, Sitting, Bed level Lower Body Bathing: Total assistance, Bed level Upper Body Dressing : Total assistance, Sitting, Bed level Lower Body Dressing: Total assistance, Bed level Toilet Transfer: Total assistance Toilet Transfer Details (indicate cue type and reason): unable to attempt this date  Functional mobility during ADLs: Total assistance, +2 for physical assistance (bed mobility )  Cognition: Cognition Overall Cognitive Status: Difficult to assess Arousal/Alertness:  Lethargic Orientation Level: Oriented to person, Oriented to place, Oriented to situation, Disoriented to time Attention: Sustained Focused Attention: Appears intact Sustained Attention: Impaired Sustained Attention Impairment: Verbal basic, Functional basic Memory:  (to be assessed) Awareness: Impaired Awareness Impairment: Emergent impairment Problem Solving: Impaired Problem Solving Impairment: Functional basic Behaviors:  (suspect impulsivity once alertness improves) Safety/Judgment: Impaired Cognition Arousal/Alertness: Lethargic, Awake/alert Behavior During Therapy: Flat affect, Restless Overall Cognitive Status: Difficult to assess Area of Impairment: Attention, Following commands, Safety/judgement, Awareness, Problem solving Current Attention Level: Focused Following Commands: Follows one step commands inconsistently, Follows one step commands with increased time Safety/Judgement: Decreased awareness of safety, Decreased awareness of deficits Awareness: Intellectual Problem Solving: Requires tactile cues, Requires verbal cues, Decreased initiation, Difficulty sequencing, Slow processing General Comments: Pt follows one step commands ~30% of time with facilitation.  She spontaneously said "hey", but no other verbalization noted.  She is easily distracted Difficult to assess due to: Impaired communication  Physical Exam: Blood pressure 149/71, pulse 52, temperature 97.5 F (36.4 C), temperature source Axillary, resp. rate 18, height _0  (1.499 m), weight 73.6 kg (162 lb 4.1 oz), SpO2 98 %. Physical Exam Constitutional: She appears well-developed.  Obese  HENT:  Head:  Normocephalic and atraumatic.  Eyes: Conjunctivae and EOM are normal.  Neck: Normal range of motion. Neck supple. No thyromegaly present.  Cardiovascular: Normal rate and regular rhythm.  Respiratory: Effort normal and breath sounds normal. No respiratory distress.  GI: Soft. Bowel sounds are normal. She  exhibits no distension.  Musculoskeletal: She exhibits edema. She exhibits no tenderness.  Neurological: She is alert.  Appears to have a right gaze preference with left neglect. Inconsistently follows one-step commands Expressive greater than receptive aphasia (slightly improved) Dysarthria Left facial weakness DTRs symmetric Unable to assess sensation Motor:? Reliability, moving right upper and right lower extremity freely. Not moving left side.  Skin: Skin is warm and dry.  Psychiatric:  Unable to assess   Results for orders placed or performed during the hospital encounter of 04/01/16 (from the past 48 hour(s))  Triglycerides     Status: None   Collection Time: 04/05/16  7:39 AM  Result Value Ref Range   Triglycerides 66 <150 mg/dL  CBC     Status: Abnormal   Collection Time: 04/05/16  7:39 AM  Result Value Ref Range   WBC 15.0 (H) 4.0 - 10.5 K/uL   RBC 4.65 3.87 - 5.11 MIL/uL   Hemoglobin 13.4 12.0 - 15.0 g/dL   HCT 42.8 36.0 - 46.0 %   MCV 92.0 78.0 - 100.0 fL   MCH 28.8 26.0 - 34.0 pg   MCHC 31.3 30.0 - 36.0 g/dL   RDW 14.0 11.5 - 15.5 %   Platelets 250 150 - 400 K/uL  Basic metabolic panel     Status: Abnormal   Collection Time: 04/05/16  7:39 AM  Result Value Ref Range   Sodium 139 135 - 145 mmol/L   Potassium 3.5 3.5 - 5.1 mmol/L   Chloride 105 101 - 111 mmol/L   CO2 26 22 - 32 mmol/L   Glucose, Bld 138 (H) 65 - 99 mg/dL   BUN 10 6 - 20 mg/dL   Creatinine, Ser 0.74 0.44 - 1.00 mg/dL   Calcium 8.6 (L) 8.9 - 10.3 mg/dL   GFR calc non Af Amer >60 >60 mL/min   GFR calc Af Amer >60 >60 mL/min    Comment: (NOTE) The eGFR has been calculated using the CKD EPI equation. This calculation has not been validated in all clinical situations. eGFR's persistently <60 mL/min signify possible Chronic Kidney Disease.    Anion gap 8 5 - 15  Glucose, capillary     Status: Abnormal   Collection Time: 04/05/16  7:57 AM  Result Value Ref Range   Glucose-Capillary 115 (H) 65  - 99 mg/dL   Comment 1 Notify RN    Comment 2 Document in Chart   Glucose, capillary     Status: Abnormal   Collection Time: 04/05/16 12:32 PM  Result Value Ref Range   Glucose-Capillary 123 (H) 65 - 99 mg/dL  Glucose, capillary     Status: Abnormal   Collection Time: 04/05/16  3:55 PM  Result Value Ref Range   Glucose-Capillary 110 (H) 65 - 99 mg/dL  Glucose, capillary     Status: Abnormal   Collection Time: 04/05/16  7:39 PM  Result Value Ref Range   Glucose-Capillary 142 (H) 65 - 99 mg/dL  Glucose, capillary     Status: Abnormal   Collection Time: 04/05/16 11:27 PM  Result Value Ref Range   Glucose-Capillary 128 (H) 65 - 99 mg/dL  Glucose, capillary     Status: Abnormal   Collection Time: 04/06/16  3:17 AM  Result Value Ref Range   Glucose-Capillary 107 (H) 65 - 99 mg/dL  Magnesium     Status: None   Collection Time: 04/06/16  4:05 AM  Result Value Ref Range   Magnesium 2.2 1.7 - 2.4 mg/dL  Phosphorus     Status: None   Collection Time: 04/06/16  4:05 AM  Result Value Ref Range   Phosphorus 3.0 2.5 - 4.6 mg/dL  CBC     Status: Abnormal   Collection Time: 04/06/16  4:05 AM  Result Value Ref Range   WBC 12.1 (H) 4.0 - 10.5 K/uL   RBC 4.42 3.87 - 5.11 MIL/uL   Hemoglobin 12.7 12.0 - 15.0 g/dL   HCT 40.5 36.0 - 46.0 %   MCV 91.6 78.0 - 100.0 fL   MCH 28.7 26.0 - 34.0 pg   MCHC 31.4 30.0 - 36.0 g/dL   RDW 13.8 11.5 - 15.5 %   Platelets 240 150 - 400 K/uL  Basic metabolic panel     Status: Abnormal   Collection Time: 04/06/16  4:05 AM  Result Value Ref Range   Sodium 140 135 - 145 mmol/L   Potassium 3.4 (L) 3.5 - 5.1 mmol/L   Chloride 105 101 - 111 mmol/L   CO2 27 22 - 32 mmol/L   Glucose, Bld 117 (H) 65 - 99 mg/dL   BUN 9 6 - 20 mg/dL   Creatinine, Ser 0.75 0.44 - 1.00 mg/dL   Calcium 8.7 (L) 8.9 - 10.3 mg/dL   GFR calc non Af Amer >60 >60 mL/min   GFR calc Af Amer >60 >60 mL/min    Comment: (NOTE) The eGFR has been calculated using the CKD EPI equation. This  calculation has not been validated in all clinical situations. eGFR's persistently <60 mL/min signify possible Chronic Kidney Disease.    Anion gap 8 5 - 15  Glucose, capillary     Status: Abnormal   Collection Time: 04/06/16  8:14 AM  Result Value Ref Range   Glucose-Capillary 110 (H) 65 - 99 mg/dL  CBC     Status: Abnormal   Collection Time: 04/07/16  4:40 AM  Result Value Ref Range   WBC 11.6 (H) 4.0 - 10.5 K/uL   RBC 4.52 3.87 - 5.11 MIL/uL   Hemoglobin 13.0 12.0 - 15.0 g/dL   HCT 41.1 36.0 - 46.0 %   MCV 90.9 78.0 - 100.0 fL   MCH 28.8 26.0 - 34.0 pg   MCHC 31.6 30.0 - 36.0 g/dL   RDW 13.5 11.5 - 15.5 %   Platelets 262 150 - 400 K/uL  Basic metabolic panel     Status: Abnormal   Collection Time: 04/07/16  4:40 AM  Result Value Ref Range   Sodium 137 135 - 145 mmol/L   Potassium 3.3 (L) 3.5 - 5.1 mmol/L   Chloride 101 101 - 111 mmol/L   CO2 26 22 - 32 mmol/L   Glucose, Bld 120 (H) 65 - 99 mg/dL   BUN 10 6 - 20 mg/dL   Creatinine, Ser 0.84 0.44 - 1.00 mg/dL   Calcium 8.9 8.9 - 10.3 mg/dL   GFR calc non Af Amer >60 >60 mL/min   GFR calc Af Amer >60 >60 mL/min    Comment: (NOTE) The eGFR has been calculated using the CKD EPI equation. This calculation has not been validated in all clinical situations. eGFR's persistently <60 mL/min signify possible Chronic Kidney Disease.    Anion gap 10 5 -  15   No results found.  Medical Problem List and Plan: 1.Left hemiplegia, aphasia, dysphagia secondary to right basal ganglia hemorrhage secondary to hypertensive crisis 2.  DVT Prophylaxis/Anticoagulation: Subcutaneous heparin for DVT prophylaxis initiated 04/07/2016 3. Pain Management/migraine headaches: Fioricet for headaches and Robaxin. 4. Dysphagia. #2 thin liquid diet. Speech therapy follow-up 5. Neuropsych: This patient is not capable of making decisions on her own behalf. 6. Skin/Wound Care: Routine skin checks 7. Fluids/Electrolytes/Nutrition: Routine I&O with  follow-up chemistries 8. Hypertension. Plendil 10 mg daily, lisinopril 20 mg twice a day, Bysystolic 20 mg every 12 hours, Hydralazine 10 mg 3 times a day. Monitor with increased mobility 9. Hyperlipidemia. Lipitor  Post Admission Physician Evaluation: 1. Functional deficits secondary  to right basal ganglia hemorrhage. 2. Patient is admitted to receive collaborative, interdisciplinary care between the physiatrist, rehab nursing staff, and therapy team. 3. Patient's level of medical complexity and substantial therapy needs in context of that medical necessity cannot be provided at a lesser intensity of care such as a SNF. 4. Patient has experienced substantial functional loss from his/her baseline which was documented above under the "Functional History" and "Functional Status" headings.  Judging by the patient's diagnosis, physical exam, and functional history, the patient has potential for functional progress which will result in measurable gains while on inpatient rehab.  These gains will be of substantial and practical use upon discharge  in facilitating mobility and self-care at the household level. 5. Physiatrist will provide 24 hour management of medical needs as well as oversight of the therapy plan/treatment and provide guidance as appropriate regarding the interaction of the two. 24 hour rehab nursing will assist with bladder management, safety, skin/wound care, disease management, medication administration and patient education 6.  and help integrate therapy concepts, techniques,education, etc. 7. PT will assess and treat for/with: Lower extremity strength, range of motion, stamina, balance, functional mobility, safety, adaptive techniques and equipment, coping skills, pain control, stroke education.   Goals are: Min A/Supervision. 8. OT will assess and treat for/with: ADL's, functional mobility, safety, upper extremity strength, adaptive techniques and equipment, ego support, and community  reintegration.   Goals are: Min A/Supervision. Therapy may proceed with showering this patient. 9. SLP will assess and treat for/with: swallowing, cognition, speech.  Goals are: Min A. 10. Case Management and Social Worker will assess and treat for psychological issues and discharge planning. 11. Team conference will be held weekly to assess progress toward goals and to determine barriers to discharge. 12. Patient will receive at least 3 hours of therapy per day at least 5 days per week. 13. ELOS: 16-19 days.       14. Prognosis:  good  Delice Lesch, MD 04/07/2016

## 2016-04-07 NOTE — Progress Notes (Signed)
Physical Therapy Treatment Patient Details Name: Abigail Campos MRN: [id number] DOB: Apr 28, 1971 Today's Date: 04/07/2016    History of Present Illness 45 y.o. female admitted to [facility] on 04/01/16 with HA, left facial droop, right gaze, and not talking eventually becoming unresponsive.  Pt dx with BG ICH.  Pt with significant PMHx of HTN, HA.      PT Comments    Patient progressing slowly towards PT goals. Continues to be slightly impulsive at times and seems visibly frustrated. Tolerated standing with assist of 2 for support. Able to cross midline today and attend to left side with multimodal cues. Appropriate for CIR. Will follow.   Follow Up Recommendations  CIR     Equipment Recommendations  Wheelchair (measurements PT);Wheelchair cushion (measurements PT);3in1 (PT);Hospital bed    Recommendations for Other Services       Precautions / Restrictions Precautions Precautions: Fall Precaution Comments: left hemi Restrictions Weight Bearing Restrictions: No    Mobility  Bed Mobility Overal bed mobility: +2 for physical assistance;Needs Assistance Bed Mobility: Supine to Sit;Sit to Supine     Supine to sit: +2 for physical assistance;Max assist Sit to supine: +2 for physical assistance;Max assist   General bed mobility comments: assist with LEs and trunk to come to sitting position. Assist with trunk and LEs when returning to bed.   Transfers Overall transfer level: Needs assistance Equipment used:  (chair in room) Transfers: Sit to/from Stand Sit to Stand: +2 physical assistance;Mod assist Stand pivot transfers: +2 physical assistance;Max assist;Mod assist       General transfer comment: Used back of recliner chair for standing. Assist with boosting from EOB supporting LUE and blocking left knee in standing. Cues for upright posture.  Stood from EOB x3.  Ambulation/Gait             General Gait Details: unable at this time   Stairs             Wheelchair Mobility    Modified Rankin (Stroke Patients Only) Modified Rankin (Stroke Patients Only) Pre-Morbid Rankin Score: No symptoms Modified Rankin: Severe disability     Balance Overall balance assessment: Needs assistance Sitting-balance support: Feet supported;Single extremity supported Sitting balance-Leahy Scale: Poor Sitting balance - Comments: Needs variable assist sitting EOB- Min A-Max A. More assist needed with dynamic activities - finding items on tray table to the left side. Able to cross midline and acknowledge left side of tray, looked in mirror towards left. Provided Max manual cues for upright posture.  Postural control: Left lateral lean Standing balance support: Bilateral upper extremity supported Standing balance-Leahy Scale: Poor Standing balance comment: Requires Max A for standing balance due to right lateral lean and therapist stabilizing right knee. Cues for upright posture however pt fatigues quickly. Stood for ~1.5 mins, 30 sec, 15 sec.                     Cognition Arousal/Alertness: Lethargic Behavior During Therapy:  (tearful) Overall Cognitive Status: Difficult to assess Area of Impairment: Attention;Problem solving;Safety/judgement   Current Attention Level: Sustained   Following Commands: Follows one step commands inconsistently;Follows one step commands with increased time Safety/Judgement: Decreased awareness of safety;Decreased awareness of deficits Awareness: Intellectual Problem Solving: Slow processing General Comments: Pt following some basic one step commands ~25% of time, verbalizing more today, however, very impulsive.  I think she is starting to have a better understanding of how significant her current situation is as she is tearful at times.  When asked, "  can you get back into the bed without help?" after two person assist to the recliner chair she thought for a moment and said, "yes".  When asked could she walk to the  bathroom by herself she thought for a moment and stated, "No, but I would try"    Exercises      General Comments General comments (skin integrity, edema, etc.): In sitting worked on finding things on her left side, pt better able to bring both head and gaze past midline today.      Pertinent Vitals/Pain Pain Assessment: Faces Faces Pain Scale: Hurts little more Pain Location: neck Pain Descriptors / Indicators: Grimacing Pain Intervention(s): Monitored during session;Repositioned    Home Living                      Prior Function            PT Goals (current goals can now be found in the care plan section) Acute Rehab PT Goals Patient Stated Goal: not stated Progress towards PT goals: Progressing toward goals    Frequency  Min 4X/week    PT Plan Current plan remains appropriate    Co-evaluation PT/OT/SLP Co-Evaluation/Treatment: Yes Reason for Co-Treatment: For patient/therapist safety;Complexity of the patient's impairments (multi-system involvement) PT goals addressed during session: Mobility/safety with mobility;Balance;Strengthening/ROM OT goals addressed during session: ADL's and self-care;Other (comment) (mobility)     End of Session Equipment Utilized During Treatment: Gait belt Activity Tolerance: Patient limited by fatigue Patient left: in bed;with call bell/phone within reach;with bed alarm set;with family/visitor present     Time: 1422-1450 PT Time Calculation (min) (ACUTE ONLY): 28 min  Charges:  $Therapeutic Activity: 8-22 mins                    G Codes:      Dafina Suk A Mitzy Naron 04/07/2016, 4:09 PM  Wray Kearns, [place], DPT [phone]

## 2016-04-08 ENCOUNTER — Encounter: Payer: Self-pay | Admitting: *Deleted

## 2016-04-08 ENCOUNTER — Inpatient Hospital Stay (HOSPITAL_COMMUNITY)
Admission: RE | Admit: 2016-04-08 | Discharge: 2016-05-08 | DRG: 057 | Disposition: A | Discharge status: Home Health | Payer: Managed Care, Other (non HMO) | Source: Intra-hospital | Attending: Physical Medicine & Rehabilitation | Admitting: Physical Medicine & Rehabilitation

## 2016-04-08 DIAGNOSIS — I69398 Other sequelae of cerebral infarction: Secondary | ICD-10-CM

## 2016-04-08 DIAGNOSIS — I69052 Hemiplegia and hemiparesis following nontraumatic subarachnoid hemorrhage affecting left dominant side: Principal | ICD-10-CM

## 2016-04-08 DIAGNOSIS — I69020 Aphasia following nontraumatic subarachnoid hemorrhage: Secondary | ICD-10-CM

## 2016-04-08 DIAGNOSIS — I69098 Other sequelae following nontraumatic subarachnoid hemorrhage: Secondary | ICD-10-CM | POA: Diagnosis not present

## 2016-04-08 DIAGNOSIS — I610 Nontraumatic intracerebral hemorrhage in hemisphere, subcortical: Secondary | ICD-10-CM | POA: Diagnosis not present

## 2016-04-08 DIAGNOSIS — Z6830 Body mass index (BMI) 30.0-30.9, adult: Secondary | ICD-10-CM

## 2016-04-08 DIAGNOSIS — I69092 Facial weakness following nontraumatic subarachnoid hemorrhage: Secondary | ICD-10-CM | POA: Diagnosis not present

## 2016-04-08 DIAGNOSIS — R001 Bradycardia, unspecified: Secondary | ICD-10-CM | POA: Diagnosis not present

## 2016-04-08 DIAGNOSIS — I69920 Aphasia following unspecified cerebrovascular disease: Secondary | ICD-10-CM | POA: Diagnosis not present

## 2016-04-08 DIAGNOSIS — E639 Nutritional deficiency, unspecified: Secondary | ICD-10-CM | POA: Insufficient documentation

## 2016-04-08 DIAGNOSIS — E669 Obesity, unspecified: Secondary | ICD-10-CM | POA: Diagnosis present

## 2016-04-08 DIAGNOSIS — L299 Pruritus, unspecified: Secondary | ICD-10-CM | POA: Insufficient documentation

## 2016-04-08 DIAGNOSIS — G43109 Migraine with aura, not intractable, without status migrainosus: Secondary | ICD-10-CM | POA: Diagnosis present

## 2016-04-08 DIAGNOSIS — I959 Hypotension, unspecified: Secondary | ICD-10-CM | POA: Diagnosis not present

## 2016-04-08 DIAGNOSIS — I952 Hypotension due to drugs: Secondary | ICD-10-CM | POA: Insufficient documentation

## 2016-04-08 DIAGNOSIS — B379 Candidiasis, unspecified: Secondary | ICD-10-CM | POA: Diagnosis not present

## 2016-04-08 DIAGNOSIS — I69091 Dysphagia following nontraumatic subarachnoid hemorrhage: Secondary | ICD-10-CM | POA: Diagnosis not present

## 2016-04-08 DIAGNOSIS — R51 Headache: Secondary | ICD-10-CM | POA: Diagnosis not present

## 2016-04-08 DIAGNOSIS — I69991 Dysphagia following unspecified cerebrovascular disease: Secondary | ICD-10-CM | POA: Insufficient documentation

## 2016-04-08 DIAGNOSIS — M62838 Other muscle spasm: Secondary | ICD-10-CM | POA: Diagnosis not present

## 2016-04-08 DIAGNOSIS — N179 Acute kidney failure, unspecified: Secondary | ICD-10-CM | POA: Diagnosis not present

## 2016-04-08 DIAGNOSIS — Z823 Family history of stroke: Secondary | ICD-10-CM

## 2016-04-08 DIAGNOSIS — L298 Other pruritus: Secondary | ICD-10-CM | POA: Diagnosis not present

## 2016-04-08 DIAGNOSIS — R269 Unspecified abnormalities of gait and mobility: Secondary | ICD-10-CM

## 2016-04-08 DIAGNOSIS — R1310 Dysphagia, unspecified: Secondary | ICD-10-CM | POA: Diagnosis present

## 2016-04-08 DIAGNOSIS — F329 Major depressive disorder, single episode, unspecified: Secondary | ICD-10-CM | POA: Diagnosis present

## 2016-04-08 DIAGNOSIS — M6249 Contracture of muscle, multiple sites: Secondary | ICD-10-CM | POA: Diagnosis not present

## 2016-04-08 DIAGNOSIS — I69359 Hemiplegia and hemiparesis following cerebral infarction affecting unspecified side: Secondary | ICD-10-CM

## 2016-04-08 DIAGNOSIS — I10 Essential (primary) hypertension: Secondary | ICD-10-CM | POA: Diagnosis present

## 2016-04-08 DIAGNOSIS — T50905A Adverse effect of unspecified drugs, medicaments and biological substances, initial encounter: Secondary | ICD-10-CM

## 2016-04-08 DIAGNOSIS — I161 Hypertensive emergency: Secondary | ICD-10-CM | POA: Diagnosis present

## 2016-04-08 DIAGNOSIS — E785 Hyperlipidemia, unspecified: Secondary | ICD-10-CM | POA: Diagnosis present

## 2016-04-08 DIAGNOSIS — Z6833 Body mass index (BMI) 33.0-33.9, adult: Secondary | ICD-10-CM | POA: Diagnosis present

## 2016-04-08 DIAGNOSIS — R638 Other symptoms and signs concerning food and fluid intake: Secondary | ICD-10-CM | POA: Insufficient documentation

## 2016-04-08 LAB — CBC
HCT: 39.6 % (ref 36.0–46.0)
Hemoglobin: 12.8 g/dL (ref 12.0–15.0)
MCH: 29.8 pg (ref 26.0–34.0)
MCHC: 32.3 g/dL (ref 30.0–36.0)
MCV: 92.1 fL (ref 78.0–100.0)
Platelets: 232 10*3/uL (ref 150–400)
RBC: 4.3 MIL/uL (ref 3.87–5.11)
RDW: 13.7 % (ref 11.5–15.5)
WBC: 9.8 10*3/uL (ref 4.0–10.5)

## 2016-04-08 LAB — BASIC METABOLIC PANEL
Anion gap: 8 (ref 5–15)
BUN: 11 mg/dL (ref 6–20)
CO2: 28 mmol/L (ref 22–32)
Calcium: 9 mg/dL (ref 8.9–10.3)
Chloride: 103 mmol/L (ref 101–111)
Creatinine, Ser: 0.94 mg/dL (ref 0.44–1.00)
GFR calc Af Amer: >60 mL/min (ref >60)
GFR calc non Af Amer: >60 mL/min (ref >60)
Glucose, Bld: 121 mg/dL — ABNORMAL HIGH (ref 65–99)
Potassium: 3.4 mmol/L — ABNORMAL LOW (ref 3.5–5.1)
Sodium: 139 mmol/L (ref 135–145)

## 2016-04-08 MED ORDER — METHOCARBAMOL 500 MG PO TABS
500.0000 mg | ORAL_TABLET | Freq: Four times a day (QID) | ORAL | Status: DC | PRN
  Administered 2016-04-09 – 2016-04-12 (×5): 500 mg via ORAL
  Filled 2016-04-08 (×5): qty 1

## 2016-04-08 MED ORDER — FELODIPINE ER 10 MG PO TB24
10.0000 mg | ORAL_TABLET | Freq: Every day | ORAL | Status: DC
  Administered 2016-04-09 – 2016-04-22 (×14): 10 mg via ORAL
  Filled 2016-04-08 (×15): qty 1

## 2016-04-08 MED ORDER — SODIUM CHLORIDE 0.9% FLUSH
10.0000 mL | INTRAVENOUS | Status: DC | PRN
  Administered 2016-04-08 – 2016-04-14 (×4): 20 mL
  Filled 2016-04-08 (×4): qty 40

## 2016-04-08 MED ORDER — ACETAMINOPHEN 325 MG PO TABS
650.0000 mg | ORAL_TABLET | ORAL | Status: DC | PRN
  Administered 2016-04-26 – 2016-05-04 (×3): 650 mg via ORAL
  Filled 2016-04-08 (×4): qty 2

## 2016-04-08 MED ORDER — ATORVASTATIN CALCIUM 20 MG PO TABS
20.0000 mg | ORAL_TABLET | Freq: Every day | ORAL | Status: DC
  Administered 2016-04-09 – 2016-05-07 (×29): 20 mg via ORAL
  Filled 2016-04-08 (×29): qty 1

## 2016-04-08 MED ORDER — SENNOSIDES-DOCUSATE SODIUM 8.6-50 MG PO TABS
1.0000 | ORAL_TABLET | Freq: Two times a day (BID) | ORAL | Status: DC
  Administered 2016-04-08 – 2016-05-08 (×57): 1 via ORAL
  Filled 2016-04-08 (×60): qty 1

## 2016-04-08 MED ORDER — BUTALBITAL-APAP-CAFFEINE 50-325-40 MG PO TABS
1.0000 | ORAL_TABLET | ORAL | Status: DC | PRN
  Administered 2016-04-08 – 2016-05-04 (×51): 1 via ORAL
  Filled 2016-04-08 (×59): qty 1

## 2016-04-08 MED ORDER — NEBIVOLOL HCL 10 MG PO TABS
20.0000 mg | ORAL_TABLET | Freq: Two times a day (BID) | ORAL | Status: DC
  Administered 2016-04-08 – 2016-04-21 (×19): 20 mg via ORAL
  Filled 2016-04-08 (×26): qty 2

## 2016-04-08 MED ORDER — ACETAMINOPHEN 650 MG RE SUPP: 650.0000 mg | RECTAL | Status: DC | PRN

## 2016-04-08 MED ORDER — HEPARIN SODIUM (PORCINE) 5000 UNIT/ML IJ SOLN: 5000.0000 [IU] | Freq: Three times a day (TID) | INTRAMUSCULAR | Status: DC

## 2016-04-08 MED ORDER — LISINOPRIL 20 MG PO TABS
20.0000 mg | ORAL_TABLET | Freq: Two times a day (BID) | ORAL | Status: DC
  Administered 2016-04-08 – 2016-04-09 (×3): 20 mg via ORAL
  Filled 2016-04-08 (×3): qty 1

## 2016-04-08 MED ORDER — HYDRALAZINE HCL 25 MG PO TABS
25.0000 mg | ORAL_TABLET | Freq: Three times a day (TID) | ORAL | Status: DC
  Administered 2016-04-09 – 2016-04-20 (×29): 25 mg via ORAL
  Filled 2016-04-08 (×38): qty 1

## 2016-04-08 MED ORDER — HEPARIN SODIUM (PORCINE) 5000 UNIT/ML IJ SOLN
5000.0000 [IU] | Freq: Three times a day (TID) | INTRAMUSCULAR | Status: DC
  Administered 2016-04-08 – 2016-05-08 (×86): 5000 [IU] via SUBCUTANEOUS
  Filled 2016-04-08 (×89): qty 1

## 2016-04-08 MED ORDER — PANTOPRAZOLE SODIUM 40 MG PO TBEC
40.0000 mg | DELAYED_RELEASE_TABLET | Freq: Every day | ORAL | Status: DC
  Administered 2016-04-09 – 2016-05-08 (×30): 40 mg via ORAL
  Filled 2016-04-08 (×30): qty 1

## 2016-04-08 MED ORDER — ONDANSETRON HCL 4 MG/2ML IJ SOLN
4.0000 mg | Freq: Four times a day (QID) | INTRAMUSCULAR | Status: DC | PRN
  Filled 2016-04-08: qty 2

## 2016-04-08 MED ORDER — SORBITOL 70 % SOLN
30.0000 mL | Freq: Every day | Status: DC | PRN
  Administered 2016-04-10 – 2016-04-27 (×2): 30 mL via ORAL
  Filled 2016-04-08 (×2): qty 30

## 2016-04-08 MED ORDER — ONDANSETRON HCL 4 MG PO TABS
4.0000 mg | ORAL_TABLET | Freq: Four times a day (QID) | ORAL | Status: DC | PRN
  Administered 2016-04-14 – 2016-04-15 (×3): 4 mg via ORAL
  Filled 2016-04-08 (×3): qty 1

## 2016-04-08 NOTE — PMR Pre-admission (Signed)
PMR Admission Coordinator Pre-Admission Assessment  Patient: Abigail Campos is an 45 y.o., female MRN: [id number] DOB: 1971/10/31 Height: _0  (149.9 cm) Weight: 73.6 kg (162 lb 4.1 oz)              Insurance Information HMO:    PPO:      PCP:      IPA:      80/20:      OTHER: POS plan PRIMARY: [employer] managed      Policy#: [id number]      Subscriber: Conard Novak CM Name: Sonia Baller      Phone#: [phone]     Fax#: [phone] Pre-Cert#: [id number]      Employer: Husband works FT Benefits:  Phone #: [phone]     Name:  On line Eff. Date: 12/14/14     Deduct:  $1600 (met $1139.86)      Out of Pocket Max: $4100 (met $1141.81)      Life Max: unlimited CIR: 80% w/auth      SNF: 80% w/auth and 120 days max Outpatient: 80%     Co-Pay: 20% Home Health: 80% with 240 visit max      Co-Pay: 20% DME: 80%     Co-Pay: 20% Providers: in network  Medicaid Application Date:        Case Manager:   Disability Application Date:        Case Worker:    Emergency Contact Information Contact Information    Name Relation Home Work Mobile   [place] Spouse   [phone]   Robinson,Linda Mother [phone]  [phone]   Robinson,Patrice Sister   [phone]     Current Medical History  Patient Admitting Diagnosis:  R BG hemorrhage  History of Present Illness: A 45 y.o. right handed female with history of hypertension and migraines. History taken from chart review. Lives with husband and has 2 children ages 22 and 51. One level home with level entry. Family members in the area to assist as needed. 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 Presented 04/01/2016 left facial droop with right gaze preference and headache. Systolic blood pressure greater than 200. Cranial CT scan showed focal area of hemorrhage arising in the posterior, lateral right basal ganglia measuring 4.5 x 3.4 x 3.4 cm with mild surrounding edema. Negative arterial findings on CTA of head and neck. Echocardiogram ejection  fraction 76% grade 2 diastolic dysfunction. Bouts of hypokalemia supplement added. Neurology consulted with conservative care. Follow-up CT of the head 04/04/2016 stable with slightly increased localized vasogenic edema. 4 mm right to left midline shift not significantly changed. Decreasing small volume acute subarachnoid hemorrhage. Dysphagia 2 thin liquid diet . Close monitoring of blood pressure.Subcutaneous heparin for DVT prophylaxis initiated 04/07/2016 Physical and occupational therapy evaluations completed with recommendations of physical medicine rehabilitation consult.  Patient to be admitted for a comprehensive inpatient rehabilitation program.   Total: 21=NIH  Past Medical History  Past Medical History  Diagnosis Date  . Headache(784.0)     developed migraines after 2nd pregnancy, had a negative CT of the head  . Hypertension   . Essential hypertension 10/19/2007    03-2010: metoprolol changed to bystolic (was not feeling well on it, no specific allergy or reaction)      Family History  family history includes Colon cancer in her mother; Diabetes in her mother and other; Glaucoma in her mother; Hypertension in her mother and other; Stroke in her maternal grandfather.  Prior Rehab/Hospitalizations: No previous rehab admissions.  Has the patient had  major surgery during 100 days prior to admission? No  Current Medications   Current facility-administered medications:  .  acetaminophen (TYLENOL) tablet 650 mg, 650 mg, Oral, Q4H PRN, 650 mg at 04/07/16 1142 **OR** acetaminophen (TYLENOL) suppository 650 mg, 650 mg, Rectal, Q4H PRN, Marliss Coots, PA-C, 650 mg at 04/07/16 0134 .  antiseptic oral rinse (CPC / CETYLPYRIDINIUM CHLORIDE 0.05%) solution 7 mL, 7 mL, Mouth Rinse, q12n4p, Rosalin Hawking, MD, 7 mL at 04/08/16 1124 .  atorvastatin (LIPITOR) tablet 20 mg, 20 mg, Oral, q1800, Rosalin Hawking, MD, 20 mg at 04/07/16 1824 .  butalbital-acetaminophen-caffeine (FIORICET, ESGIC) 50-325-40 MG per  tablet 1 tablet, 1 tablet, Oral, Q4H PRN, Wallie Char, 1 tablet at 04/08/16 1148 .  chlorhexidine (PERIDEX) 0.12 % solution 15 mL, 15 mL, Mouth Rinse, BID, Rosalin Hawking, MD, 15 mL at 04/08/16 1031 .  cyclobenzaprine (FLEXERIL) tablet 5 mg, 5 mg, Oral, TID PRN, Rosalin Hawking, MD, 5 mg at 04/08/16 0536 .  feeding supplement (ENSURE ENLIVE) (ENSURE ENLIVE) liquid 237 mL, 237 mL, Oral, BID BM, Rosalin Hawking, MD, 237 mL at 04/08/16 1032 .  felodipine (PLENDIL) 24 hr tablet 10 mg, 10 mg, Oral, Daily, Garvin Fila, MD, 10 mg at 04/08/16 1031 .  heparin injection 5,000 Units, 5,000 Units, Subcutaneous, Q8H, Rosalin Hawking, MD, 5,000 Units at 04/08/16 5859 .  hydrALAZINE (APRESOLINE) injection 10-20 mg, 10-20 mg, Intravenous, Q4H PRN, Garvin Fila, MD, 10 mg at 04/08/16 0543 .  hydrALAZINE (APRESOLINE) tablet 25 mg, 25 mg, Oral, Q8H, Rosalin Hawking, MD, 25 mg at 04/08/16 0620 .  labetalol (NORMODYNE,TRANDATE) injection 10 mg, 10 mg, Intravenous, Q2H PRN, Garvin Fila, MD .  lisinopril (PRINIVIL,ZESTRIL) tablet 20 mg, 20 mg, Oral, BID, Rosalin Hawking, MD, 20 mg at 04/08/16 1032 .  nebivolol (BYSTOLIC) tablet 20 mg, 20 mg, Oral, Q12H, Garvin Fila, MD, 20 mg at 04/08/16 1031 .  ondansetron (ZOFRAN) injection 4 mg, 4 mg, Intravenous, Q8H PRN, Ram Fuller Mandril, MD, 4 mg at 04/02/16 1723 .  pantoprazole (PROTONIX) EC tablet 40 mg, 40 mg, Oral, Daily, Rosalin Hawking, MD, 40 mg at 04/08/16 1032 .  senna-docusate (Senokot-S) tablet 1 tablet, 1 tablet, Oral, BID, Marliss Coots, PA-C, 1 tablet at 04/08/16 1032 .  sodium chloride flush (NS) 0.9 % injection 10-40 mL, 10-40 mL, Intracatheter, Q12H, Garvin Fila, MD, 10 mL at 04/08/16 1032 .  sodium chloride flush (NS) 0.9 % injection 10-40 mL, 10-40 mL, Intracatheter, PRN, Garvin Fila, MD, 20 mL at 04/07/16 0453  Patients Current Diet: Diet Heart Room service appropriate?: Yes; Fluid consistency:: Thin  Precautions / Restrictions Precautions Precautions:  Fall Precaution Comments: left hemi Restrictions Weight Bearing Restrictions: No   Has the patient had 2 or more falls or a fall with injury in the past year?No  Prior Activity Level Community (5-7x/wk): Went out daily.  Was independent and driving.  Worked as a Sales executive.  Home Assistive Devices / Equipment Home Equipment: None  Prior Device Use: Indicate devices/aids used by the patient prior to current illness, exacerbation or injury? None  Prior Functional Level Prior Function Level of Independence: Independent 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  Self Care: Did the patient need help bathing, dressing, using the toilet or eating?  Independent  Indoor Mobility: Did the patient need assistance with walking from room to room (with or without device)? Independent  Stairs: Did the patient need assistance with internal or external stairs (with or without device)? Independent  Functional Cognition: Did the patient need help planning regular tasks such as shopping or remembering to take medications? Independent  Current Functional Level Cognition  Arousal/Alertness: Lethargic Overall Cognitive Status: Difficult to assess Difficult to assess due to: Impaired communication Current Attention Level: Sustained Orientation Level: Oriented to person Following Commands: Follows one step commands inconsistently, Follows one step commands with increased time Safety/Judgement: Decreased awareness of safety, Decreased awareness of deficits General Comments: Pt following some basic one step commands ~25% of time, verbalizing more today, however, very impulsive.  I think she is starting to have a better understanding of how significant her current situation is as she is tearful at times.  When asked, " can you get back into the bed without help?" after two person assist to the recliner chair she thought for a moment and said, "yes".  When asked could she walk to the bathroom by herself she  thought for a moment and stated, "No, but I would try" Attention: Sustained Focused Attention: Appears intact Sustained Attention: Impaired Sustained Attention Impairment: Verbal basic, Functional basic Memory:  (to be assessed) Awareness: Impaired Awareness Impairment: Emergent impairment Problem Solving: Impaired Problem Solving Impairment: Functional basic Behaviors:  (suspect impulsivity once alertness improves) Safety/Judgment: Impaired    Extremity Assessment (includes Sensation/Coordination)  Upper Extremity Assessment: LUE deficits/detail LUE Deficits / Details: no spontaneous movement noted.  mild tightness in digits.  PROM WFL  LUE Coordination: decreased fine motor, decreased gross motor  Lower Extremity Assessment: Defer to PT evaluation LLE Deficits / Details: dense hemi, no active movement observed, low tone    ADLs  Overall ADL's : Needs assistance/impaired Eating/Feeding: NPO Grooming: Oral care, Wash/dry face, Brushing hair, Bed level, Sitting, Maximal assistance (Mod assist for balance sitting EOB Grooming Details (indicate cue type and reason): washed face in bed; other tasks sitting EOB Upper Body Bathing: Sitting, Total assistance (sitting EOB; washed armpits) Upper Body Bathing Details (indicate cue type and reason): Max assist for balance and assist for lifting LUE to wash it Lower Body Bathing: Total assistance, Bed level Upper Body Dressing : Sitting, Maximal assistance (sitting EOB) Upper Body Dressing Details (indicate cue type and reason): Mod assist for balance and also assist with gown Lower Body Dressing: Total assistance, Bed level Toilet Transfer: +2 for physical assistance, Moderate assistance (sit to stand from bed) Toilet Transfer Details (indicate cue type and reason): unable to attempt this date  Functional mobility during ADLs: +2 for physical assistance, Moderate assistance (sit to stand from bed) General ADL Comments: Recommended pt's mom  sit on pt's left side. OT placed grooming items on pt's left to try to get her to attend to left side. Pt able to get oral care items from left and also able to retrieve hairbrush on left after cues to look around.     Mobility  Overal bed mobility: +2 for physical assistance, Needs Assistance Bed Mobility: Supine to Sit, Sit to Supine Supine to sit: +2 for physical assistance, Max assist Sit to supine: +2 for physical assistance, Max assist General bed mobility comments: assist with LEs and trunk to come to sitting position. Assist with trunk and LEs when returning to bed.     Transfers  Overall transfer level: Needs assistance Equipment used:  (chair in room) Transfers: Sit to/from Stand Sit to Stand: +2 physical assistance, Mod assist Stand pivot transfers: +2 physical assistance, Max assist, Mod assist General transfer comment: Used back of recliner chair for standing. Assist with boosting from EOB supporting LUE and  blocking left knee in standing. Cues for upright posture.  Stood from EOB x3.    Ambulation / Gait / Stairs / Wheelchair Mobility  Ambulation/Gait General Gait Details: unable at this time    Posture / Balance Dynamic Sitting Balance Sitting balance - Comments: Needs variable assist sitting EOB- Min A-Max A. More assist needed with dynamic activities - finding items on tray table to the left side. Able to cross midline and acknowledge left side of tray, looked in mirror towards left. Provided Max manual cues for upright posture.  Balance Overall balance assessment: Needs assistance Sitting-balance support: Feet supported, Single extremity supported Sitting balance-Leahy Scale: Poor Sitting balance - Comments: Needs variable assist sitting EOB- Min A-Max A. More assist needed with dynamic activities - finding items on tray table to the left side. Able to cross midline and acknowledge left side of tray, looked in mirror towards left. Provided Max manual cues for upright  posture.  Postural control: Left lateral lean Standing balance support: Bilateral upper extremity supported Standing balance-Leahy Scale: Poor Standing balance comment: Requires Max A for standing balance due to right lateral lean and therapist stabilizing right knee. Cues for upright posture however pt fatigues quickly. Stood for ~1.5 mins, 30 sec, 15 sec.     Special needs/care consideration BiPAP/CPAP No CPM No Continuous Drip IV No Dialysis No         Life Vest No Oxygen No Special Bed No Trach Size No Wound Vac (area)No      Skin No                           Bowel mgmt: Last BM 04/05/16 with incontinence Bladder mgmt: Voiding with incontinence Diabetic mgmt yes, or oral medications at home    Previous Home Environment Living Arrangements: Spouse/significant other, Children (10/18 y.o children)  Lives With: Spouse Available Help at Discharge: Family Type of Home: House Home Layout: One level Home Access: Level entry Bathroom Shower/Tub: Tub/shower unit Additional Comments: husband to take FMLA, and other family available as needed to help out  Discharge Living Setting Plans for Discharge Living Setting: Patient's home, House, Lives with (comment) (Lives with husband, 60 yo dtr and 41 yo son.) Type of Home at Discharge: Apartment Discharge Home Layout: One level Discharge Home Access: Stairs to enter Entrance Stairs-Number of Steps: 1 step  Social/Family/Support Systems Patient Roles: Spouse, Parent, Other (Comment) (Has a husband, 37 yo Dtr, 55 yo son, sister, parents.) Contact Information: Berna Bue - husband Anticipated Caregiver: husband, sister, mom, dad Anticipated Caregiver's Contact Information: [name] - husband [phone] Ability/Limitations of Caregiver: Husband works.  He will have caregiver assistance from sister, mom and dad. (Husband to take FMLA.) Caregiver Availability: 24/7 Discharge Plan Discussed with Primary Caregiver: Yes Is Caregiver In  Agreement with Plan?: Yes Does Caregiver/Family have Issues with Lodging/Transportation while Pt is in Rehab?: No  Goals/Additional Needs Patient/Family Goal for Rehab: PT/OT/ST supervision to min assist goals Expected length of stay: 18-22 days Cultural Considerations: None Dietary Needs: Heart diet, thin liquids Equipment Needs: TBD Pt/Family Agrees to Admission and willing to participate: Yes Program Orientation Provided & Reviewed with Pt/Caregiver Including Roles  & Responsibilities: Yes  Decrease burden of Care through IP rehab admission: N/A  Possible need for SNF placement upon discharge: Not planned, but may be needed if patient cannot progress to level where family can manage at home.  Patient Condition: This patient's condition remains as documented in the consult dated  04/06/16, in which the Rehabilitation Physician determined and documented that the patient's condition is appropriate for intensive rehabilitative care in an inpatient rehabilitation facility pending caregiver support and therapy tolerance. These areas have been addressed. Family will provide needed care at home.  PT/OT/ST currently treating patient and recommending inpatient rehab admission.  Will admit to inpatient rehab today.  Preadmission Screen Completed By:  Retta Diones, 04/08/2016 11:54 AM ______________________________________________________________________   Discussed status with Dr. Posey Pronto on 04/08/16 at 1154 and received telephone approval for admission today.  Admission Coordinator:  Retta Diones, time1154/Date04/26/17

## 2016-04-08 NOTE — Progress Notes (Signed)
Retta Diones, RN Rehab Admission Coordinator Signed Physical Medicine and Rehabilitation PMR Pre-admission 04/08/2016 11:31 AM  Related encounter: ED to Hosp-Admission (Current) from 04/01/2016 in [place] Collapse All   PMR Admission Coordinator Pre-Admission Assessment  Patient: Abigail Campos is an 45 y.o., female MRN: [id number] DOB: 1971/09/22 Height: '4\' 11"'$  (149.9 cm) Weight: 73.6 kg (162 lb 4.1 oz)  Insurance Information HMO: PPO: PCP: IPA: 80/20: OTHER: POS plan PRIMARY: [employer] managed Policy#: [id number] Subscriber: Conard Novak CM Name: Sonia Baller Phone#: [phone] Fax#: [phone] Pre-Cert#: [id number] Employer: Husband works [employer] Benefits: Phone #: [phone] Name: On line Eff. Date: 12/14/14 Deduct: $1600 (met $1139.86) Out of Pocket Max: $4100 (met $1141.81) Life Max: unlimited CIR: 80% w/auth SNF: 80% w/auth and 120 days max Outpatient: 80% Co-Pay: 20% Home Health: 80% with 240 visit max Co-Pay: 20% DME: 80% Co-Pay: 20% Providers: in network  Medicaid Application Date: Case Manager:  Disability Application Date: Case Worker:   Emergency Contact Information Contact Information    Name Relation Home Work Mobile   [place] Spouse   [phone]   Robinson,Linda Mother [phone]  [phone]   Robinson,Patrice Sister   [phone]     Current Medical History  Patient Admitting Diagnosis: R BG hemorrhage  History of Present Illness: A 45 y.o. right handed female with history of hypertension and migraines. History taken from chart review. Lives with husband and has 2 children ages 59 and 65. One level home with level entry. Family  members in the area to assist as needed. 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 Presented 04/01/2016 left facial droop with right gaze preference and headache. Systolic blood pressure greater than 200. Cranial CT scan showed focal area of hemorrhage arising in the posterior, lateral right basal ganglia measuring 4.5 x 3.4 x 3.4 cm with mild surrounding edema. Negative arterial findings on CTA of head and neck. Echocardiogram ejection fraction 99% grade 2 diastolic dysfunction. Bouts of hypokalemia supplement added. Neurology consulted with conservative care. Follow-up CT of the head 04/04/2016 stable with slightly increased localized vasogenic edema. 4 mm right to left midline shift not significantly changed. Decreasing small volume acute subarachnoid hemorrhage. Dysphagia 2 thin liquid diet . Close monitoring of blood pressure.Subcutaneous heparin for DVT prophylaxis initiated 04/07/2016 Physical and occupational therapy evaluations completed with recommendations of physical medicine rehabilitation consult. Patient to be admitted for a comprehensive inpatient rehabilitation program.  Total: 21=NIH  Past Medical History  Past Medical History  Diagnosis Date  . Headache(784.0)     developed migraines after 2nd pregnancy, had a negative CT of the head  . Hypertension   . Essential hypertension 10/19/2007    03-2010: metoprolol changed to bystolic (was not feeling well on it, no specific allergy or reaction)     Family History  family history includes Colon cancer in her mother; Diabetes in her mother and other; Glaucoma in her mother; Hypertension in her mother and other; Stroke in her maternal grandfather.  Prior Rehab/Hospitalizations: No previous rehab admissions.  Has the patient had major surgery during 100 days prior to admission? No  Current Medications   Current facility-administered medications:  . acetaminophen (TYLENOL) tablet 650 mg, 650 mg, Oral, Q4H PRN, 650 mg at  04/07/16 1142 **OR** acetaminophen (TYLENOL) suppository 650 mg, 650 mg, Rectal, Q4H PRN, Marliss Coots, PA-C, 650 mg at 04/07/16 0134 . antiseptic oral rinse (CPC / CETYLPYRIDINIUM CHLORIDE 0.05%) solution 7 mL, 7 mL, Mouth Rinse, q12n4p, Rosalin Hawking, MD,  7 mL at 04/08/16 1124 . atorvastatin (LIPITOR) tablet 20 mg, 20 mg, Oral, q1800, Rosalin Hawking, MD, 20 mg at 04/07/16 1824 . butalbital-acetaminophen-caffeine (FIORICET, ESGIC) 50-325-40 MG per tablet 1 tablet, 1 tablet, Oral, Q4H PRN, Wallie Char, 1 tablet at 04/08/16 1148 . chlorhexidine (PERIDEX) 0.12 % solution 15 mL, 15 mL, Mouth Rinse, BID, Rosalin Hawking, MD, 15 mL at 04/08/16 1031 . cyclobenzaprine (FLEXERIL) tablet 5 mg, 5 mg, Oral, TID PRN, Rosalin Hawking, MD, 5 mg at 04/08/16 0536 . feeding supplement (ENSURE ENLIVE) (ENSURE ENLIVE) liquid 237 mL, 237 mL, Oral, BID BM, Rosalin Hawking, MD, 237 mL at 04/08/16 1032 . felodipine (PLENDIL) 24 hr tablet 10 mg, 10 mg, Oral, Daily, Garvin Fila, MD, 10 mg at 04/08/16 1031 . heparin injection 5,000 Units, 5,000 Units, Subcutaneous, Q8H, Rosalin Hawking, MD, 5,000 Units at 04/08/16 1751 . hydrALAZINE (APRESOLINE) injection 10-20 mg, 10-20 mg, Intravenous, Q4H PRN, Garvin Fila, MD, 10 mg at 04/08/16 0543 . hydrALAZINE (APRESOLINE) tablet 25 mg, 25 mg, Oral, Q8H, Rosalin Hawking, MD, 25 mg at 04/08/16 0620 . labetalol (NORMODYNE,TRANDATE) injection 10 mg, 10 mg, Intravenous, Q2H PRN, Garvin Fila, MD . lisinopril (PRINIVIL,ZESTRIL) tablet 20 mg, 20 mg, Oral, BID, Rosalin Hawking, MD, 20 mg at 04/08/16 1032 . nebivolol (BYSTOLIC) tablet 20 mg, 20 mg, Oral, Q12H, Garvin Fila, MD, 20 mg at 04/08/16 1031 . ondansetron (ZOFRAN) injection 4 mg, 4 mg, Intravenous, Q8H PRN, Ram Fuller Mandril, MD, 4 mg at 04/02/16 1723 . pantoprazole (PROTONIX) EC tablet 40 mg, 40 mg, Oral, Daily, Rosalin Hawking, MD, 40 mg at 04/08/16 1032 . senna-docusate (Senokot-S) tablet 1 tablet, 1 tablet, Oral, BID, Marliss Coots,  PA-C, 1 tablet at 04/08/16 1032 . sodium chloride flush (NS) 0.9 % injection 10-40 mL, 10-40 mL, Intracatheter, Q12H, Garvin Fila, MD, 10 mL at 04/08/16 1032 . sodium chloride flush (NS) 0.9 % injection 10-40 mL, 10-40 mL, Intracatheter, PRN, Garvin Fila, MD, 20 mL at 04/07/16 0453  Patients Current Diet: Diet Heart Room service appropriate?: Yes; Fluid consistency:: Thin  Precautions / Restrictions Precautions Precautions: Fall Precaution Comments: left hemi Restrictions Weight Bearing Restrictions: No   Has the patient had 2 or more falls or a fall with injury in the past year?No  Prior Activity Level Community (5-7x/wk): Went out daily. Was independent and driving. Worked as a Sales executive.  Home Assistive Devices / Equipment Home Equipment: None  Prior Device Use: Indicate devices/aids used by the patient prior to current illness, exacerbation or injury? None  Prior Functional Level Prior Function Level of Independence: Independent 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  Self Care: Did the patient need help bathing, dressing, using the toilet or eating? Independent  Indoor Mobility: Did the patient need assistance with walking from room to room (with or without device)? Independent  Stairs: Did the patient need assistance with internal or external stairs (with or without device)? Independent  Functional Cognition: Did the patient need help planning regular tasks such as shopping or remembering to take medications? Independent  Current Functional Level Cognition  Arousal/Alertness: Lethargic Overall Cognitive Status: Difficult to assess Difficult to assess due to: Impaired communication Current Attention Level: Sustained Orientation Level: Oriented to person Following Commands: Follows one step commands inconsistently, Follows one step commands with increased time Safety/Judgement: Decreased awareness of safety, Decreased awareness of deficits General  Comments: Pt following some basic one step commands ~25% of time, verbalizing more today, however, very impulsive. I think she is starting  to have a better understanding of how significant her current situation is as she is tearful at times. When asked, " can you get back into the bed without help?" after two person assist to the recliner chair she thought for a moment and said, "yes". When asked could she walk to the bathroom by herself she thought for a moment and stated, "No, but I would try" Attention: Sustained Focused Attention: Appears intact Sustained Attention: Impaired Sustained Attention Impairment: Verbal basic, Functional basic Memory: (to be assessed) Awareness: Impaired Awareness Impairment: Emergent impairment Problem Solving: Impaired Problem Solving Impairment: Functional basic Behaviors: (suspect impulsivity once alertness improves) Safety/Judgment: Impaired   Extremity Assessment (includes Sensation/Coordination)  Upper Extremity Assessment: LUE deficits/detail LUE Deficits / Details: no spontaneous movement noted. mild tightness in digits. PROM WFL  LUE Coordination: decreased fine motor, decreased gross motor  Lower Extremity Assessment: Defer to PT evaluation LLE Deficits / Details: dense hemi, no active movement observed, low tone    ADLs  Overall ADL's : Needs assistance/impaired Eating/Feeding: NPO Grooming: Oral care, Wash/dry face, Brushing hair, Bed level, Sitting, Maximal assistance (Mod assist for balance sitting EOB Grooming Details (indicate cue type and reason): washed face in bed; other tasks sitting EOB Upper Body Bathing: Sitting, Total assistance (sitting EOB; washed armpits) Upper Body Bathing Details (indicate cue type and reason): Max assist for balance and assist for lifting LUE to wash it Lower Body Bathing: Total assistance, Bed level Upper Body Dressing : Sitting, Maximal assistance (sitting EOB) Upper Body Dressing Details  (indicate cue type and reason): Mod assist for balance and also assist with gown Lower Body Dressing: Total assistance, Bed level Toilet Transfer: +2 for physical assistance, Moderate assistance (sit to stand from bed) Toilet Transfer Details (indicate cue type and reason): unable to attempt this date  Functional mobility during ADLs: +2 for physical assistance, Moderate assistance (sit to stand from bed) General ADL Comments: Recommended pt's mom sit on pt's left side. OT placed grooming items on pt's left to try to get her to attend to left side. Pt able to get oral care items from left and also able to retrieve hairbrush on left after cues to look around.    Mobility  Overal bed mobility: +2 for physical assistance, Needs Assistance Bed Mobility: Supine to Sit, Sit to Supine Supine to sit: +2 for physical assistance, Max assist Sit to supine: +2 for physical assistance, Max assist General bed mobility comments: assist with LEs and trunk to come to sitting position. Assist with trunk and LEs when returning to bed.     Transfers  Overall transfer level: Needs assistance Equipment used: (chair in room) Transfers: Sit to/from Stand Sit to Stand: +2 physical assistance, Mod assist Stand pivot transfers: +2 physical assistance, Max assist, Mod assist General transfer comment: Used back of recliner chair for standing. Assist with boosting from EOB supporting LUE and blocking left knee in standing. Cues for upright posture. Stood from EOB x3.    Ambulation / Gait / Stairs / Wheelchair Mobility  Ambulation/Gait General Gait Details: unable at this time    Posture / Balance Dynamic Sitting Balance Sitting balance - Comments: Needs variable assist sitting EOB- Min A-Max A. More assist needed with dynamic activities - finding items on tray table to the left side. Able to cross midline and acknowledge left side of tray, looked in mirror towards left. Provided Max manual cues for  upright posture.  Balance Overall balance assessment: Needs assistance Sitting-balance support: Feet supported, Single extremity  supported Sitting balance-Leahy Scale: Poor Sitting balance - Comments: Needs variable assist sitting EOB- Min A-Max A. More assist needed with dynamic activities - finding items on tray table to the left side. Able to cross midline and acknowledge left side of tray, looked in mirror towards left. Provided Max manual cues for upright posture.  Postural control: Left lateral lean Standing balance support: Bilateral upper extremity supported Standing balance-Leahy Scale: Poor Standing balance comment: Requires Max A for standing balance due to right lateral lean and therapist stabilizing right knee. Cues for upright posture however pt fatigues quickly. Stood for ~1.5 mins, 30 sec, 15 sec.     Special needs/care consideration BiPAP/CPAP No CPM No Continuous Drip IV No Dialysis No  Life Vest No Oxygen No Special Bed No Trach Size No Wound Vac (area)No  Skin No  Bowel mgmt: Last BM 04/05/16 with incontinence Bladder mgmt: Voiding with incontinence Diabetic mgmt yes, or oral medications at home    Previous Home Environment Living Arrangements: Spouse/significant other, Children (10/18 y.o children) Lives With: Spouse Available Help at Discharge: Family Type of Home: House Home Layout: One level Home Access: Level entry Bathroom Shower/Tub: Tub/shower unit Additional Comments: husband to take FMLA, and other family available as needed to help out  Discharge Living Setting Plans for Discharge Living Setting: Patient's home, House, Lives with (comment) (Lives with husband, 65 yo dtr and 23 yo son.) Type of Home at Discharge: Apartment Discharge Home Layout: One level Discharge Home Access: Stairs to enter Entrance Stairs-Number of Steps: 1 step  Social/Family/Support Systems Patient Roles: Spouse, Parent,  Other (Comment) (Has a husband, 8 yo Dtr, 49 yo son, sister, parents.) Contact Information: Berna Bue - husband Anticipated Caregiver: husband, sister, mom, dad Anticipated Caregiver's Contact Information: [name] - husband [phone] Ability/Limitations of Caregiver: Husband works. He will have caregiver assistance from sister, mom and dad. (Husband to take FMLA.) Caregiver Availability: 24/7 Discharge Plan Discussed with Primary Caregiver: Yes Is Caregiver In Agreement with Plan?: Yes Does Caregiver/Family have Issues with Lodging/Transportation while Pt is in Rehab?: No  Goals/Additional Needs Patient/Family Goal for Rehab: PT/OT/ST supervision to min assist goals Expected length of stay: 18-22 days Cultural Considerations: None Dietary Needs: Heart diet, thin liquids Equipment Needs: TBD Pt/Family Agrees to Admission and willing to participate: Yes Program Orientation Provided & Reviewed with Pt/Caregiver Including Roles & Responsibilities: Yes  Decrease burden of Care through IP rehab admission: N/A  Possible need for SNF placement upon discharge: Not planned, but may be needed if patient cannot progress to level where family can manage at home.  Patient Condition: This patient's condition remains as documented in the consult dated 04/06/16, in which the Rehabilitation Physician determined and documented that the patient's condition is appropriate for intensive rehabilitative care in an inpatient rehabilitation facility pending caregiver support and therapy tolerance. These areas have been addressed. Family will provide needed care at home. PT/OT/ST currently treating patient and recommending inpatient rehab admission. Will admit to inpatient rehab today.  Preadmission Screen Completed By: Retta Diones, 04/08/2016 11:54 AM ______________________________________________________________________  Discussed status with Dr. Posey Pronto on 04/08/16 at 1154 and received telephone  approval for admission today.  Admission Coordinator: Retta Diones, time1154/Date04/26/17          Cosigned by: Ankit [name], MD at 04/08/2016 12:02 PM  Revision History     Date/Time User Provider Type Action   04/08/2016 12:02 PM Ankit [name], MD Physician Cosign   04/08/2016 11:54 AM Retta Diones, RN Rehab  Admission Coordinator Sign

## 2016-04-08 NOTE — Interval H&P Note (Deleted)
Abigail Campos was admitted today to Inpatient Rehabilitation with the diagnosis of right basal ganglia hemorrhage.  The patient's history has been reviewed, patient examined, and there is no change in status.  Patient continues to be appropriate for intensive inpatient rehabilitation.  I have reviewed the patient's chart and labs.  Questions were answered to the patient's satisfaction. The PAPE has been reviewed and assessment remains appropriate.  Ramondo Dietze [name] 04/08/2016, 11:22 PM

## 2016-04-08 NOTE — H&P (View-Only) (Signed)
Physical Medicine and Rehabilitation Admission H&P    Chief Complaint  Patient presents with  . code stroke   : HPI: Abigail Campos is a 45 y.o. right handed female with history of hypertension and migraines. History taken from chart review. Lives with husband and has 2 children ages 4 and 62. One level home with level entry. Family members in the area to assist as needed. 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 Presented 04/01/2016 left facial droop with right gaze preference and headache. Systolic blood pressure greater than 200. Cranial CT scan showed focal area of hemorrhage arising in the posterior, lateral right basal ganglia measuring 4.5 x 3.4 x 3.4 cm with mild surrounding edema. Negative arterial findings on CTA of head and neck. Echocardiogram ejection fraction 41% grade 2 diastolic dysfunction. Bouts of hypokalemia supplement added. Neurology consulted with conservative care. Follow-up CT of the head 04/04/2016 stable with slightly increased localized vasogenic edema. 4 mm right to left midline shift not significantly changed. Decreasing small volume acute subarachnoid hemorrhage. Dysphagia 2 thin liquid diet . Close monitoring of blood pressure.Subcutaneous heparin for DVT prophylaxis initiated 04/07/2016 PhysicalAnd occupational therapy evaluations completed with recommendations of physical medicine rehabilitation consult.Patient was admitted for a comprehensive rehabilitation program  ROS Review of Systems  Unable to perform ROS: medical condition    Past Medical History  Diagnosis Date  . Headache(784.0)     developed migraines after 2nd pregnancy, had a negative CT of the head  . Hypertension   . Essential hypertension 10/19/2007    03-2010: metoprolol changed to bystolic (was not feeling well on it, no specific allergy or reaction)     Past Surgical History  Procedure Laterality Date  . Inguinal hernia repair      02/21/03  . Cesarean section      [id number]  . Abdominal  hysterectomy  11-14-07    no oophorectomy per surgical report   Family History  Problem Relation Age of Onset  . Diabetes Mother   . Hypertension Mother   . Glaucoma Mother   . Heart disease      2 uncles died 2011/02/21 from CAD-CHF  . Stroke Maternal Grandfather   . Colon cancer Mother     mother age 82  . Diabetes Other     siblings  . Hypertension Other     siblings   Social History:  reports that she has never smoked. She has never used smokeless tobacco. She reports that she does not drink alcohol or use illicit drugs. Allergies:  Allergies  Allergen Reactions  . Bee Venom Anaphylaxis  . Peanut-Containing Drug Products Anaphylaxis  . Hydrocodone Hives    Generalize itching w/o rash  . Ambien [Zolpidem Tartrate] Other (See Comments)    forgetfulness  . Aspirin Swelling    Reports blisters w/ ASA but pt reports is ok w/ Motrin, Advil, naproxen  . Latex Hives   Medications Prior to Admission  Medication Sig Dispense Refill  . Cholecalciferol (VITAMIN D PO) Take 1 tablet by mouth daily.     . cyclobenzaprine (FLEXERIL) 10 MG tablet Take 1 tablet (10 mg total) by mouth 3 (three) times daily as needed for muscle spasms. 21 tablet 0  . famotidine (PEPCID) 20 MG tablet One at bedtime (Patient taking differently: Take 20 mg by mouth at bedtime. ) 30 tablet 2  . felodipine (PLENDIL) 10 MG 24 hr tablet Take 1 tablet (10 mg total) by mouth daily. 30 tablet 4  . furosemide (LASIX) 20  MG tablet Take 1 tablet (20 mg total) by mouth daily. 30 tablet 3  . Multiple Vitamin (MULTIVITAMIN) tablet Take 1 tablet by mouth 2 (two) times daily.      . Nebivolol HCl 20 MG TABS Take 1 tablet (20 mg total) by mouth 2 (two) times daily. 60 tablet 4  . pantoprazole (PROTONIX) 40 MG tablet Take 1 tablet (40 mg total) by mouth daily. Take 30-60 min before first meal of the day 30 tablet 2    Home: [place] expects to be discharged to:: Private residence Living Arrangements:  Spouse/significant other, Children (10/18 y.o children) Available Help at Discharge: Family Type of Home: House Home Access: Level entry Home Layout: One level Bathroom Shower/Tub: Producer, television/film/video: None Additional Comments: husband to take FMLA, and other family available as needed to help out  Lives With: Spouse   Functional History: Prior Function Level of Independence: Independent 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  Functional Status:  Mobility: Bed Mobility Overal bed mobility: Needs Assistance, + 2 for safety/equipment, +2 for physical assistance Bed Mobility: Supine to Sit, Sit to Supine Supine to sit: +2 for safety/equipment, +2 for physical assistance, Min assist, Mod assist Sit to supine: Min assist, Mod assist, +2 for physical assistance, +2 for safety/equipment General bed mobility comments: Pt tends to over shoot her intended direction moving quickly both into and out of bed.  She fluctuates between min and mod assist for safety, balance and transitions.  Transfers General transfer comment: NT- Dr. Leonie Man only approved EOB today.       ADL: ADL Overall ADL's : Needs assistance/impaired Eating/Feeding: NPO Grooming: Wash/dry hands, Wash/dry face, Oral care, Brushing hair, Total assistance Grooming Details (indicate cue type and reason): Pt was able to demonstrate correct use of toothbrush but did not engage in further ADL activity as she fatigued  Upper Body Bathing: Total assistance, Sitting, Bed level Lower Body Bathing: Total assistance, Bed level Upper Body Dressing : Total assistance, Sitting, Bed level Lower Body Dressing: Total assistance, Bed level Toilet Transfer: Total assistance Toilet Transfer Details (indicate cue type and reason): unable to attempt this date  Functional mobility during ADLs: Total assistance, +2 for physical assistance (bed mobility )  Cognition: Cognition Overall Cognitive Status: Difficult to assess Arousal/Alertness:  Lethargic Orientation Level: Oriented to person, Oriented to place, Oriented to situation, Disoriented to time Attention: Sustained Focused Attention: Appears intact Sustained Attention: Impaired Sustained Attention Impairment: Verbal basic, Functional basic Memory:  (to be assessed) Awareness: Impaired Awareness Impairment: Emergent impairment Problem Solving: Impaired Problem Solving Impairment: Functional basic Behaviors:  (suspect impulsivity once alertness improves) Safety/Judgment: Impaired Cognition Arousal/Alertness: Lethargic, Awake/alert Behavior During Therapy: Flat affect, Restless Overall Cognitive Status: Difficult to assess Area of Impairment: Attention, Following commands, Safety/judgement, Awareness, Problem solving Current Attention Level: Focused Following Commands: Follows one step commands inconsistently, Follows one step commands with increased time Safety/Judgement: Decreased awareness of safety, Decreased awareness of deficits Awareness: Intellectual Problem Solving: Requires tactile cues, Requires verbal cues, Decreased initiation, Difficulty sequencing, Slow processing General Comments: Pt follows one step commands ~30% of time with facilitation.  She spontaneously said "hey", but no other verbalization noted.  She is easily distracted Difficult to assess due to: Impaired communication  Physical Exam: Blood pressure 149/71, pulse 52, temperature 97.5 F (36.4 C), temperature source Axillary, resp. rate 18, height _0  (1.499 m), weight 73.6 kg (162 lb 4.1 oz), SpO2 98 %. Physical Exam Constitutional: She appears well-developed.  Obese  HENT:  Head:  Normocephalic and atraumatic.  Eyes: Conjunctivae and EOM are normal.  Neck: Normal range of motion. Neck supple. No thyromegaly present.  Cardiovascular: Normal rate and regular rhythm.  Respiratory: Effort normal and breath sounds normal. No respiratory distress.  GI: Soft. Bowel sounds are normal. She  exhibits no distension.  Musculoskeletal: She exhibits edema. She exhibits no tenderness.  Neurological: She is alert.  Appears to have a right gaze preference with left neglect. Inconsistently follows one-step commands Expressive greater than receptive aphasia (slightly improved) Dysarthria Left facial weakness DTRs symmetric Unable to assess sensation Motor:? Reliability, moving right upper and right lower extremity freely. Not moving left side.  Skin: Skin is warm and dry.  Psychiatric:  Unable to assess   Results for orders placed or performed during the hospital encounter of 04/01/16 (from the past 48 hour(s))  Triglycerides     Status: None   Collection Time: 04/05/16  7:39 AM  Result Value Ref Range   Triglycerides 66 <150 mg/dL  CBC     Status: Abnormal   Collection Time: 04/05/16  7:39 AM  Result Value Ref Range   WBC 15.0 (H) 4.0 - 10.5 K/uL   RBC 4.65 3.87 - 5.11 MIL/uL   Hemoglobin 13.4 12.0 - 15.0 g/dL   HCT 42.8 36.0 - 46.0 %   MCV 92.0 78.0 - 100.0 fL   MCH 28.8 26.0 - 34.0 pg   MCHC 31.3 30.0 - 36.0 g/dL   RDW 14.0 11.5 - 15.5 %   Platelets 250 150 - 400 K/uL  Basic metabolic panel     Status: Abnormal   Collection Time: 04/05/16  7:39 AM  Result Value Ref Range   Sodium 139 135 - 145 mmol/L   Potassium 3.5 3.5 - 5.1 mmol/L   Chloride 105 101 - 111 mmol/L   CO2 26 22 - 32 mmol/L   Glucose, Bld 138 (H) 65 - 99 mg/dL   BUN 10 6 - 20 mg/dL   Creatinine, Ser 0.74 0.44 - 1.00 mg/dL   Calcium 8.6 (L) 8.9 - 10.3 mg/dL   GFR calc non Af Amer >60 >60 mL/min   GFR calc Af Amer >60 >60 mL/min    Comment: (NOTE) The eGFR has been calculated using the CKD EPI equation. This calculation has not been validated in all clinical situations. eGFR's persistently <60 mL/min signify possible Chronic Kidney Disease.    Anion gap 8 5 - 15  Glucose, capillary     Status: Abnormal   Collection Time: 04/05/16  7:57 AM  Result Value Ref Range   Glucose-Capillary 115 (H) 65  - 99 mg/dL   Comment 1 Notify RN    Comment 2 Document in Chart   Glucose, capillary     Status: Abnormal   Collection Time: 04/05/16 12:32 PM  Result Value Ref Range   Glucose-Capillary 123 (H) 65 - 99 mg/dL  Glucose, capillary     Status: Abnormal   Collection Time: 04/05/16  3:55 PM  Result Value Ref Range   Glucose-Capillary 110 (H) 65 - 99 mg/dL  Glucose, capillary     Status: Abnormal   Collection Time: 04/05/16  7:39 PM  Result Value Ref Range   Glucose-Capillary 142 (H) 65 - 99 mg/dL  Glucose, capillary     Status: Abnormal   Collection Time: 04/05/16 11:27 PM  Result Value Ref Range   Glucose-Capillary 128 (H) 65 - 99 mg/dL  Glucose, capillary     Status: Abnormal   Collection Time: 04/06/16  3:17 AM  Result Value Ref Range   Glucose-Capillary 107 (H) 65 - 99 mg/dL  Magnesium     Status: None   Collection Time: 04/06/16  4:05 AM  Result Value Ref Range   Magnesium 2.2 1.7 - 2.4 mg/dL  Phosphorus     Status: None   Collection Time: 04/06/16  4:05 AM  Result Value Ref Range   Phosphorus 3.0 2.5 - 4.6 mg/dL  CBC     Status: Abnormal   Collection Time: 04/06/16  4:05 AM  Result Value Ref Range   WBC 12.1 (H) 4.0 - 10.5 K/uL   RBC 4.42 3.87 - 5.11 MIL/uL   Hemoglobin 12.7 12.0 - 15.0 g/dL   HCT 40.5 36.0 - 46.0 %   MCV 91.6 78.0 - 100.0 fL   MCH 28.7 26.0 - 34.0 pg   MCHC 31.4 30.0 - 36.0 g/dL   RDW 13.8 11.5 - 15.5 %   Platelets 240 150 - 400 K/uL  Basic metabolic panel     Status: Abnormal   Collection Time: 04/06/16  4:05 AM  Result Value Ref Range   Sodium 140 135 - 145 mmol/L   Potassium 3.4 (L) 3.5 - 5.1 mmol/L   Chloride 105 101 - 111 mmol/L   CO2 27 22 - 32 mmol/L   Glucose, Bld 117 (H) 65 - 99 mg/dL   BUN 9 6 - 20 mg/dL   Creatinine, Ser 0.75 0.44 - 1.00 mg/dL   Calcium 8.7 (L) 8.9 - 10.3 mg/dL   GFR calc non Af Amer >60 >60 mL/min   GFR calc Af Amer >60 >60 mL/min    Comment: (NOTE) The eGFR has been calculated using the CKD EPI equation. This  calculation has not been validated in all clinical situations. eGFR's persistently <60 mL/min signify possible Chronic Kidney Disease.    Anion gap 8 5 - 15  Glucose, capillary     Status: Abnormal   Collection Time: 04/06/16  8:14 AM  Result Value Ref Range   Glucose-Capillary 110 (H) 65 - 99 mg/dL  CBC     Status: Abnormal   Collection Time: 04/07/16  4:40 AM  Result Value Ref Range   WBC 11.6 (H) 4.0 - 10.5 K/uL   RBC 4.52 3.87 - 5.11 MIL/uL   Hemoglobin 13.0 12.0 - 15.0 g/dL   HCT 41.1 36.0 - 46.0 %   MCV 90.9 78.0 - 100.0 fL   MCH 28.8 26.0 - 34.0 pg   MCHC 31.6 30.0 - 36.0 g/dL   RDW 13.5 11.5 - 15.5 %   Platelets 262 150 - 400 K/uL  Basic metabolic panel     Status: Abnormal   Collection Time: 04/07/16  4:40 AM  Result Value Ref Range   Sodium 137 135 - 145 mmol/L   Potassium 3.3 (L) 3.5 - 5.1 mmol/L   Chloride 101 101 - 111 mmol/L   CO2 26 22 - 32 mmol/L   Glucose, Bld 120 (H) 65 - 99 mg/dL   BUN 10 6 - 20 mg/dL   Creatinine, Ser 0.84 0.44 - 1.00 mg/dL   Calcium 8.9 8.9 - 10.3 mg/dL   GFR calc non Af Amer >60 >60 mL/min   GFR calc Af Amer >60 >60 mL/min    Comment: (NOTE) The eGFR has been calculated using the CKD EPI equation. This calculation has not been validated in all clinical situations. eGFR's persistently <60 mL/min signify possible Chronic Kidney Disease.    Anion gap 10 5 -  15   No results found.  Medical Problem List and Plan: 1.Left hemiplegia, aphasia, dysphagia secondary to right basal ganglia hemorrhage secondary to hypertensive crisis 2.  DVT Prophylaxis/Anticoagulation: Subcutaneous heparin for DVT prophylaxis initiated 04/07/2016 3. Pain Management/migraine headaches: Fioricet for headaches and Robaxin. 4. Dysphagia. #2 thin liquid diet. Speech therapy follow-up 5. Neuropsych: This patient is not capable of making decisions on her own behalf. 6. Skin/Wound Care: Routine skin checks 7. Fluids/Electrolytes/Nutrition: Routine I&O with  follow-up chemistries 8. Hypertension. Plendil 10 mg daily, lisinopril 20 mg twice a day, Bysystolic 20 mg every 12 hours, Hydralazine 10 mg 3 times a day. Monitor with increased mobility 9. Hyperlipidemia. Lipitor  Post Admission Physician Evaluation: 1. Functional deficits secondary  to right basal ganglia hemorrhage. 2. Patient is admitted to receive collaborative, interdisciplinary care between the physiatrist, rehab nursing staff, and therapy team. 3. Patient's level of medical complexity and substantial therapy needs in context of that medical necessity cannot be provided at a lesser intensity of care such as a SNF. 4. Patient has experienced substantial functional loss from his/her baseline which was documented above under the "Functional History" and "Functional Status" headings.  Judging by the patient's diagnosis, physical exam, and functional history, the patient has potential for functional progress which will result in measurable gains while on inpatient rehab.  These gains will be of substantial and practical use upon discharge  in facilitating mobility and self-care at the household level. 5. Physiatrist will provide 24 hour management of medical needs as well as oversight of the therapy plan/treatment and provide guidance as appropriate regarding the interaction of the two. 24 hour rehab nursing will assist with bladder management, safety, skin/wound care, disease management, medication administration and patient education 6.  and help integrate therapy concepts, techniques,education, etc. 7. PT will assess and treat for/with: Lower extremity strength, range of motion, stamina, balance, functional mobility, safety, adaptive techniques and equipment, coping skills, pain control, stroke education.   Goals are: Min A/Supervision. 8. OT will assess and treat for/with: ADL's, functional mobility, safety, upper extremity strength, adaptive techniques and equipment, ego support, and community  reintegration.   Goals are: Min A/Supervision. Therapy may proceed with showering this patient. 9. SLP will assess and treat for/with: swallowing, cognition, speech.  Goals are: Min A. 10. Case Management and Social Worker will assess and treat for psychological issues and discharge planning. 11. Team conference will be held weekly to assess progress toward goals and to determine barriers to discharge. 12. Patient will receive at least 3 hours of therapy per day at least 5 days per week. 13. ELOS: 16-19 days.       14. Prognosis:  good  Delice Lesch, MD 04/07/2016

## 2016-04-08 NOTE — Consult Note (Addendum)
Abigail [name], MD Physician Signed Physical Medicine and Rehabilitation Consult Note 04/06/2016 9:17 AM  Related encounter: ED to Hosp-Admission (Current) from 04/01/2016 in [facility] Collapse All        Physical Medicine and Rehabilitation Consult Reason for Consult: Right basal ganglia hemorrhage secondary to hypertensive crisis Referring Physician: Dr.Xu   HPI: Abigail Campos is a 45 y.o. right handed female with history of hypertension and migraines. History taken from chart review. Lives with husband and has 2 children ages 26 and 26. One level home with level entry. Family members in the area to assist as needed. 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 Presented 04/01/2016 left facial droop with right gaze preference and headache. Systolic blood pressure greater than 200. Cranial CT scan showed focal area of hemorrhage arising in the posterior, lateral right basal ganglia measuring 4.5 x 3.4 x 3.4 cm with mild surrounding edema. Negative arterial findings on CTA of head and neck. Echocardiogram ejection fraction [id number] grade 2 diastolic dysfunction. Bouts of hypokalemia supplement added. Neurology consulted with conservative care. Follow-up CT of the head 04/04/2016 stable with slightly increased localized vasogenic edema. 4 mm right to left midline shift not significantly changed. Decreasing small volume acute subarachnoid hemorrhage. Dysphagia 1 thin liquid diet she pulled out her nasogastric tube. Close monitoring of blood pressure. Physical occupational therapy evaluations completed with recommendations of physical medicine rehabilitation consult.   Review of Systems  Unable to perform ROS: medical condition   Past Medical History  Diagnosis Date  . Headache(784.0)     developed migraines after 2nd pregnancy, had a negative CT of the head  . Hypertension   . Essential hypertension 10/19/2007    04-03-2010: metoprolol changed  to bystolic (was not feeling well on it, no specific allergy or reaction)    Past Surgical History  Procedure Laterality Date  . Inguinal hernia repair      04-04-2003  . Cesarean section      [id number]  . Abdominal hysterectomy  11-14-07    no oophorectomy per surgical report   Family History  Problem Relation Age of Onset  . Diabetes Mother   . Hypertension Mother   . Glaucoma Mother   . Heart disease      2 uncles died 04-Apr-2011 from CAD-CHF  . Stroke Maternal Grandfather   . Colon cancer Mother     mother age 82  . Diabetes Other     siblings  . Hypertension Other     siblings   Social History: Per reports, she has never smoked. She has never used smokeless tobacco. She reports that she does not drink alcohol or use illicit drugs. Allergies:  Allergies  Allergen Reactions  . Bee Venom Anaphylaxis  . Peanut-Containing Drug Products Anaphylaxis  . Hydrocodone Hives    Generalize itching w/o rash  . Ambien [Zolpidem Tartrate] Other (See Comments)    forgetfulness  . Aspirin Swelling    Reports blisters w/ ASA but pt reports is ok w/ Motrin, Advil, naproxen  . Latex Hives   Medications Prior to Admission  Medication Sig Dispense Refill  . Cholecalciferol (VITAMIN D PO) Take 1 tablet by mouth daily.     . cyclobenzaprine (FLEXERIL) 10 MG tablet Take 1 tablet (10 mg total) by mouth 3 (three) times daily as needed for muscle spasms. 21 tablet 0  . famotidine (PEPCID) 20 MG tablet One at bedtime (Patient taking differently: Take 20 mg by mouth  at bedtime. ) 30 tablet 2  . felodipine (PLENDIL) 10 MG 24 hr tablet Take 1 tablet (10 mg total) by mouth daily. 30 tablet 4  . furosemide (LASIX) 20 MG tablet Take 1 tablet (20 mg total) by mouth daily. 30 tablet 3  . Multiple Vitamin (MULTIVITAMIN) tablet Take 1 tablet by mouth 2 (two) times daily.       . Nebivolol HCl 20 MG TABS Take 1 tablet (20 mg total) by mouth 2 (two) times daily. 60 tablet 4  . pantoprazole (PROTONIX) 40 MG tablet Take 1 tablet (40 mg total) by mouth daily. Take 30-60 min before first meal of the day 30 tablet 2    Home: [place] expects to be discharged to:: Private residence Living Arrangements: Spouse/significant other, Children (10/18 y.o children) Available Help at Discharge: Family Type of Home: House Home Access: Level entry Home Layout: One level Bathroom Shower/Tub: Producer, television/film/video: None Additional Comments: husband to take FMLA, and other family available as needed to help out Lives With: Spouse  Functional History: Prior Function Level of Independence: Independent 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 Functional Status:  Mobility: Bed Mobility Overal bed mobility: Needs Assistance, + 2 for safety/equipment, +2 for physical assistance Bed Mobility: Supine to Sit, Sit to Supine Supine to sit: +2 for safety/equipment, +2 for physical assistance, Min assist, Mod assist Sit to supine: Min assist, Mod assist, +2 for physical assistance, +2 for safety/equipment General bed mobility comments: Pt tends to over shoot her intended direction moving quickly both into and out of bed. She fluctuates between min and mod assist for safety, balance and transitions.  Transfers General transfer comment: NT- Dr. Leonie Man only approved EOB today.       ADL: ADL Overall ADL's : Needs assistance/impaired Eating/Feeding: NPO Grooming: Wash/dry hands, Wash/dry face, Oral care, Brushing hair, Total assistance Grooming Details (indicate cue type and reason): Pt was able to demonstrate correct use of toothbrush but did not engage in further ADL activity as she fatigued  Upper Body Bathing: Total assistance, Sitting, Bed level Lower Body Bathing: Total assistance, Bed level Upper Body Dressing : Total assistance, Sitting, Bed  level Lower Body Dressing: Total assistance, Bed level Toilet Transfer: Total assistance Toilet Transfer Details (indicate cue type and reason): unable to attempt this date  Functional mobility during ADLs: Total assistance, +2 for physical assistance (bed mobility )  Cognition: Cognition Overall Cognitive Status: Difficult to assess Arousal/Alertness: Lethargic Orientation Level: Oriented to person, Oriented to situation, Disoriented to place, Disoriented to time Attention: Sustained Focused Attention: Appears intact Sustained Attention: Impaired Sustained Attention Impairment: Verbal basic, Functional basic Memory: (to be assessed) Awareness: Impaired Awareness Impairment: Emergent impairment Problem Solving: Impaired Problem Solving Impairment: Functional basic Behaviors: (suspect impulsivity once alertness improves) Safety/Judgment: Impaired Cognition Arousal/Alertness: Lethargic, Awake/alert Behavior During Therapy: Flat affect, Restless Overall Cognitive Status: Difficult to assess Area of Impairment: Attention, Following commands, Safety/judgement, Awareness, Problem solving Current Attention Level: Focused Following Commands: Follows one step commands inconsistently, Follows one step commands with increased time Safety/Judgement: Decreased awareness of safety, Decreased awareness of deficits Awareness: Intellectual Problem Solving: Requires tactile cues, Requires verbal cues, Decreased initiation, Difficulty sequencing, Slow processing General Comments: Pt follows one step commands ~30% of time with facilitation. She spontaneously said "hey", but no other verbalization noted. She is easily distracted Difficult to assess due to: Impaired communication  Blood pressure 141/90, pulse 73, temperature 98.3 F (36.8 C), temperature source Oral, resp. rate 20, height 4\' 11"  (1.499 m),  weight 73.6 kg (162 lb 4.1 oz), SpO2 100 %. Physical Exam  Vitals  reviewed. Constitutional: She appears well-developed.  Obese  HENT:  Head: Normocephalic and atraumatic.  Eyes: Conjunctivae and EOM are normal.  Neck: Normal range of motion. Neck supple. No thyromegaly present.  Cardiovascular: Normal rate and regular rhythm.  Respiratory: Effort normal and breath sounds normal. No respiratory distress.  GI: Soft. Bowel sounds are normal. She exhibits no distension.  Musculoskeletal: She exhibits edema. She exhibits no tenderness.  Neurological: She is alert.  Appears to have a right gaze preference with left neglect. Inconsistently follows one-step commands Expressive greater than receptive aphasia Severe dysarthria Left facial weakness DTRs symmetric Unable to assess sensation Motor:? Reliability, moving right upper and right lower extremity freely. Not moving left side.  Skin: Skin is warm and dry.  Psychiatric:  Unable to assess     Lab Results Last 24 Hours    Results for orders placed or performed during the hospital encounter of 04/01/16 (from the past 24 hour(s))  Glucose, capillary Status: Abnormal   Collection Time: 04/05/16 12:32 PM  Result Value Ref Range   Glucose-Capillary 123 (H) 65 - 99 mg/dL  Glucose, capillary Status: Abnormal   Collection Time: 04/05/16 3:55 PM  Result Value Ref Range   Glucose-Capillary 110 (H) 65 - 99 mg/dL  Glucose, capillary Status: Abnormal   Collection Time: 04/05/16 7:39 PM  Result Value Ref Range   Glucose-Capillary 142 (H) 65 - 99 mg/dL  Glucose, capillary Status: Abnormal   Collection Time: 04/05/16 11:27 PM  Result Value Ref Range   Glucose-Capillary 128 (H) 65 - 99 mg/dL  Glucose, capillary Status: Abnormal   Collection Time: 04/06/16 3:17 AM  Result Value Ref Range   Glucose-Capillary 107 (H) 65 - 99 mg/dL  Magnesium Status: None   Collection Time: 04/06/16 4:05 AM  Result Value Ref Range    Magnesium 2.2 1.7 - 2.4 mg/dL  Phosphorus Status: None   Collection Time: 04/06/16 4:05 AM  Result Value Ref Range   Phosphorus 3.0 2.5 - 4.6 mg/dL  CBC Status: Abnormal   Collection Time: 04/06/16 4:05 AM  Result Value Ref Range   WBC 12.1 (H) 4.0 - 10.5 K/uL   RBC 4.42 3.87 - 5.11 MIL/uL   Hemoglobin 12.7 12.0 - 15.0 g/dL   HCT 40.5 36.0 - 46.0 %   MCV 91.6 78.0 - 100.0 fL   MCH 28.7 26.0 - 34.0 pg   MCHC 31.4 30.0 - 36.0 g/dL   RDW 13.8 11.5 - 15.5 %   Platelets 240 150 - 400 K/uL  Basic metabolic panel Status: Abnormal   Collection Time: 04/06/16 4:05 AM  Result Value Ref Range   Sodium 140 135 - 145 mmol/L   Potassium 3.4 (L) 3.5 - 5.1 mmol/L   Chloride 105 101 - 111 mmol/L   CO2 27 22 - 32 mmol/L   Glucose, Bld 117 (H) 65 - 99 mg/dL   BUN 9 6 - 20 mg/dL   Creatinine, Ser 0.75 0.44 - 1.00 mg/dL   Calcium 8.7 (L) 8.9 - 10.3 mg/dL   GFR calc non Af Amer >60 >60 mL/min   GFR calc Af Amer >60 >60 mL/min   Anion gap 8 5 - 15  Glucose, capillary Status: Abnormal   Collection Time: 04/06/16 8:14 AM  Result Value Ref Range   Glucose-Capillary 110 (H) 65 - 99 mg/dL      Imaging Results (Last 48 hours)    No results  found.    Assessment/Plan: Diagnosis: Right basal ganglia hemorrhage Labs and images independently reviewed. Records reviewed and summated above. Stroke: Continue secondary stroke prophylaxis and Risk Factor Modification listed below:  Blood Pressure Management: Continue current medication with prn's with permisive HTN per primary team Pre-diabetes management:  Left sided hemiparesis: fit for orthotics to prevent contractures (resting hand splint for day, wrist cock up splint at night, PRAFO, etc) Motor recovery: Fluoxetine  1. Does the need for close, 24 hr/day medical supervision in concert with the patient's rehab  needs make it unreasonable for this patient to be served in a less intensive setting? Yes  2. Co-Morbidities requiring supervision/potential complications: HTN (monitor and provide prns in accordance with increased physical exertion and pain), hypokalemia (continue to monitor, replete as necessary), dysphagia (continue SLP, consider vital stim, advance diet as tolerated), Bradycardia (monitor heart rate with increased physical activity, monitor for signs of increasing ICP), leukocytosis (cont to monitor for signs and symptoms of infection, further workup if indicated), migraines (ensure pain does not limit therapies) 3. Due to bladder management, safety, skin/wound care, disease management, medication administration and patient education, does the patient require 24 hr/day rehab nursing? Yes 4. Does the patient require coordinated care of a physician, rehab nurse, PT (1-2 hrs/day, 5 days/week), OT (1-2 hrs/day, 5 days/week) and SLP (1-2 hrs/day, 5 days/week) to address physical and functional deficits in the context of the above medical diagnosis(es)? Yes Addressing deficits in the following areas: balance, endurance, locomotion, strength, transferring, bathing, dressing, feeding, grooming, toileting, cognition, speech, language, swallowing and psychosocial support 5. Can the patient actively participate in an intensive therapy program of at least 3 hrs of therapy per day at least 5 days per week? Potentially 6. The potential for patient to make measurable gains while on inpatient rehab is excellent 7. Anticipated functional outcomes upon discharge from inpatient rehab are TBD with PT, TBD with OT, min assist with SLP. 8. Estimated rehab length of stay to reach the above functional goals is: 20-24 days. 9. Does the patient have adequate social supports and living environment to accommodate these discharge functional goals? Yes 10. Anticipated D/C setting: Home 11. Anticipated post D/C treatments: HH  therapy and Home excercise program 12. Overall Rehab/Functional Prognosis: good and fair  RECOMMENDATIONS: This patient's condition is appropriate for continued rehabilitative care in the following setting: Will need to clarify caregiver support at discharge as well as await more thorough therapy evaluation prior to determining most appropriate discharge disposition. Patient has agreed to participate in recommended program. Potentially Note that insurance prior authorization may be required for reimbursement for recommended care.  Comment: Rehab Admissions Coordinator to follow up.  Delice Lesch, MD 04/06/2016       Revision History     Date/Time User Provider Type Action   04/06/2016 3:24 PM Abigail [name], MD Physician Sign   04/06/2016 9:43 AM Cathlyn Parsons, PA-C Physician Assistant Pend   View Details Report       Routing History     Date/Time From To Method   04/06/2016 3:24 PM Abigail [name], MD [place], MD In [place]

## 2016-04-08 NOTE — Interval H&P Note (Signed)
Abigail Campos was admitted today to Inpatient Rehabilitation with the diagnosis of right basal ganglia hemorrhage.  The patient's history has been reviewed, patient examined, and there is no change in status.  Patient continues to be appropriate for intensive inpatient rehabilitation.  I have reviewed the patient's chart and labs.  Questions were answered to the patient's satisfaction. The PAPE has been reviewed and assessment remains appropriate.  Kristi Norment [name] 04/08/2016, 11:29 PM

## 2016-04-08 NOTE — Progress Notes (Signed)
SLP Cancellation Note  Patient Details Name: Abigail Campos MRN: [id number] DOB: Oct 09, 1971   Cancelled treatment:       Reason Eval/Treat Not Completed: Fatigue/lethargy limiting ability to participate (pt sleeping, spouse reports she had a headache last night, he reports good tolerance of diet advancement, will continue efforts)   Luanna Salk, [facility] SLP [phone]

## 2016-04-08 NOTE — Progress Notes (Signed)
Patient ID: Abigail Campos, female   DOB: 1971-02-26, 45 y.o.   MRN: [id number] Patient admitted to [phone] via bed, escorted by nursing staff and family.  Patient and family verbalized understanding of rehab process.  Appears to be in no immediate distress at this time.   Brita Romp, RN

## 2016-04-08 NOTE — Discharge Summary (Signed)
Stroke Discharge Summary  Patient ID: Abigail Campos   MRN: [id number]      DOB: Jan 08, 1971  Date of Admission: 04/01/2016 Date of Discharge: 04/08/2016  Attending Physician:  Rosalin Hawking, MD, Stroke MD Consulting Physician(s):   Baltazar Apo, MD ( pulmonary/intensive care ), Delice Lesch, MD (Physical Medicine & Rehabtilitation)  Patient's PCP:  Kathlene November, MD  Discharge Diagnoses:  Principal Problem:   ICH (intracerebral hemorrhage) ([place]) - R basal ganglia due to hypertensive emergency Active Problems:   Essential hypertension   Encephalopathy acute   Hypokalemia   Dysphagia, post-stroke   Aphasia, post-stroke   Bradycardia   Dysarthria, post-stroke   Hypertensive emergency   Obesity (BMI 30.0-34.9)   Hyperlipidemia BMI  Body mass index is 32.75 kg/(m^2).   Past Medical History  Diagnosis Date  . Headache(784.0)     developed migraines after 2nd pregnancy, had a negative CT of the head  . Hypertension   . Essential hypertension 10/19/2007    03-2010: metoprolol changed to bystolic (was not feeling well on it, no specific allergy or reaction)     Past Surgical History  Procedure Laterality Date  . Inguinal hernia repair      2004  . Cesarean section      [id number]  . Abdominal hysterectomy  11-14-07    no oophorectomy per surgical report    Medications to be continued on Rehab . antiseptic oral rinse  7 mL Mouth Rinse q12n4p  . atorvastatin  20 mg Oral q1800  . chlorhexidine  15 mL Mouth Rinse BID  . feeding supplement (ENSURE ENLIVE)  237 mL Oral BID BM  . felodipine  10 mg Oral Daily  . heparin subcutaneous  5,000 Units Subcutaneous Q8H  . hydrALAZINE  25 mg Oral Q8H  . lisinopril  20 mg Oral BID  . nebivolol  20 mg Oral Q12H  . pantoprazole  40 mg Oral Daily  . senna-docusate  1 tablet Oral BID  . sodium chloride flush  10-40 mL Intracatheter Q12H    LABORATORY STUDIES CBC    Component Value Date/Time   WBC 9.8 04/08/2016 0445   WBC 6.1 12/20/2007  1316   RBC 4.30 04/08/2016 0445   RBC 4.06 12/20/2007 1316   RBC 4.19 11/09/2007 1304   HGB 12.8 04/08/2016 0445   HGB 9.9* 12/20/2007 1316   HCT 39.6 04/08/2016 0445   HCT 30.8* 12/20/2007 1316   PLT 232 04/08/2016 0445   PLT 415* 12/20/2007 1316   MCV 92.1 04/08/2016 0445   MCV 76* 12/20/2007 1316   MCH 29.8 04/08/2016 0445   MCH 24.4* 12/20/2007 1316   MCHC 32.3 04/08/2016 0445   MCHC 32.2 12/20/2007 1316   RDW 13.7 04/08/2016 0445   RDW 17.3* 12/20/2007 1316   LYMPHSABS 3.9 04/01/2016 1533   LYMPHSABS 1.5 12/20/2007 1316   MONOABS 0.9 04/01/2016 1533   EOSABS 0.1 04/01/2016 1533   EOSABS 0.2 12/20/2007 1316   BASOSABS 0.0 04/01/2016 1533   BASOSABS 0.0 12/20/2007 1316   CMP    Component Value Date/Time   NA 139 04/08/2016 0445   K 3.4* 04/08/2016 0445   CL 103 04/08/2016 0445   CO2 28 04/08/2016 0445   GLUCOSE 121* 04/08/2016 0445   BUN 11 04/08/2016 0445   CREATININE 0.94 04/08/2016 0445   CREATININE 0.73 03/10/2013 1612   CALCIUM 9.0 04/08/2016 0445   PROT 8.2* 04/01/2016 1533   ALBUMIN 4.0 04/01/2016 1533   AST 26 04/01/2016  1533   ALT 24 04/01/2016 1533   ALKPHOS 69 04/01/2016 1533   BILITOT 0.5 04/01/2016 1533   GFRNONAA >60 04/08/2016 0445   GFRAA >60 04/08/2016 0445   COAGS Lab Results  Component Value Date   INR 1.02 04/01/2016   INR 1.0 10/15/2007   INR 1.1 10/07/2007   Lipid Panel    Component Value Date/Time   CHOL 169 04/04/2016 0415   TRIG 66 04/05/2016 0739   HDL 37* 04/04/2016 0415   CHOLHDL 4.6 04/04/2016 0415   VLDL 19 04/04/2016 0415   LDLCALC 113* 04/04/2016 0415   HgbA1C  Lab Results  Component Value Date   HGBA1C 6.0* 04/04/2016   Cardiac Panel (last 3 results) No results for input(s): CKTOTAL, CKMB, TROPONINI, RELINDX in the last 72 hours. Urinalysis    Component Value Date/Time   COLORURINE YELLOW 04/01/2016 1806   APPEARANCEUR CLEAR 04/01/2016 1806   LABSPEC 1.010 04/01/2016 1806   PHURINE 8.0 04/01/2016 1806    GLUCOSEU NEGATIVE 04/01/2016 1806   GLUCOSEU NEGATIVE 07/12/2013 0901   HGBUR NEGATIVE 04/01/2016 1806   BILIRUBINUR NEGATIVE 04/01/2016 1806   KETONESUR NEGATIVE 04/01/2016 1806   PROTEINUR NEGATIVE 04/01/2016 1806   UROBILINOGEN 1.0 11/15/2013 2314   NITRITE NEGATIVE 04/01/2016 1806   LEUKOCYTESUR NEGATIVE 04/01/2016 1806   Urine Drug Screen     Component Value Date/Time   LABOPIA NONE DETECTED 04/01/2016 1806   COCAINSCRNUR NONE DETECTED 04/01/2016 1806   LABBENZ NONE DETECTED 04/01/2016 1806   AMPHETMU NONE DETECTED 04/01/2016 1806   THCU NONE DETECTED 04/01/2016 1806   LABBARB NONE DETECTED 04/01/2016 1806    Alcohol Level    Component Value Date/Time   ETH <5 04/01/2016 1533     SIGNIFICANT DIAGNOSTIC STUDIES  Ct Head Wo Contrast 04/04/2016 IMPRESSION: 1. Stable right lentiform nucleus hemorrhage with slightly increased localized vasogenic edema. Localized mass effect with 4 mm right-to-left shift not significantly changed. 2. Decreasing small volume acute subarachnoid hemorrhage. 04/02/2016 IMPRESSION: Stable RIGHT putaminal intracerebral hemorrhage, favored to represent a hypertensive related event.  04/01/2016 IMPRESSION: Intraparenchymal hemorrhage arising from the posterior, lateral right basal ganglia with subarachnoid hemorrhage extending into the sylvian fissure on the right. There is mass effect with mild midline shift toward the left of approximately 7 mm. Elsewhere gray-white compartments normal. Mild paranasal sinus disease in the maxillary antra bilaterally. Volume of hemorrhage in the right basal ganglia region is approximately 26 cubic cm. Note that there is additional hemorrhage in the subarachnoid space on the right which cannot be easily quantified with respect to specific volume of hemorrhage.   Ct Angio Head and neck W/cm &/or Wo Cm 04/02/2016 IMPRESSION: 1. Negative arterial findings on CTA head and neck, aside from mild calcified atherosclerosis of the  left ICA siphon. 2. Stable hyperdense intra-axial hemorrhage in the right hemisphere since yesterday. Negative CTA Spot Sign, and no evidence of associated vascular malformation. Estimated blood volume of 21 mL. Mildly increased surrounding edema, but stable mild regional mass effect. 3. Poor dentition.   2D echocardiogram  Normal LV size and systolic function, EF [id number]. Mild LV hypertrophy. Moderate diastolic dysfunction. Normal RV size and systolic function. No significant valvular abnormalities.     HISTORY OF PRESENT ILLNESS Abigail Campos is an 45 y.o. female with history of HA and HTN. She was at work today and complaining of a HA. At 1500 04/01/2016 she was noted by her peers to have a sudden onset of left facial droop, right gaze and not talking  then became unresponsive. EMS arrived and BP was elevated at [id number] systolic. Initial CT head showed a right BG ICH. ICH score 2. Patient was not administered IV t-PA secondary to [place]. She was admitted to the neuro ICU for further evaluation and treatment.    HOSPITAL COURSE Abigail Campos is a 45 y.o. female with history of HA and HTN presenting with severe HA and L sided weakness. CT showed a R BG hemorrhage.   Stroke: right basal ganglia hemorrhage most likely secondary to hypertensive emergency.  Resultant Right gaze, left neglect, L hemiparesis  CT head right BG hemorrhage   Repeat CT head stable hematoma  CTA head & neck negative for AVM or aneurysm  2D Echo EF 60-65%   No antithrombotic prior to admission, not on antithrombotics due to [place]  Ongoing aggressive stroke risk factor management  Therapy recommendations: CIR.  Disposition: CIR  Hypertensive Emergency  SBP >200 per EMS in setting of neurologic symptoms. 179/109 on admission.  Initially started on nicardipine with poor control, changed to Cleviprex with better control  Hx difficulty controlling BP per husband  Weaned off IV  antihypertensives  Started on plendil and bystolic and lisinopril for BP control, added hydralazine  RA ultrasound 03/2015 showed no stenosis  Hyperlipidemia  LDL 113, goal < 70  No statin at home  added lipitor 20mg   Continue statin at discharge  Other Stroke Risk Factors  Obesity, Body mass index is 32.75 kg/(m^2).   Family hx stroke (maternal grandfather)   DISCHARGE EXAM Blood pressure 145/86, pulse 60, temperature 98.6 F (37 C), temperature source Axillary, resp. rate 22, height 4\' 11"  (1.499 m), weight 73.6 kg (162 lb 4.1 oz), SpO2 100 %. General - Well nourished, well developed, in no apparent distress.  Ophthalmologic - Fundi not visualized due to noncooperation.  Cardiovascular - Regular rate and rhythm.  Mental Status -  Awake alert, follow simple commands, able to tell me her name, but still not orientated to age, time or place. Left neglect, not blinking to visual threat on the left. Right gaze preference, but able to cross midline. PERRL. Left facial droop. Left UE 0/5 increased tone, left LE 1/5 with decreased tone. RUE and RLE 5/5 with spontaneous movement. Sensation, coordination and gait not tested.    Discharge Diet  DIET DYS 2 Room service appropriate?: Yes; Fluid consistency:: Thin liquids  DISCHARGE PLAN  Disposition:  Transfer to [facility] for ongoing PT, OT and ST  Due to hemorrhage and risk of bleeding, do not take aspirin, aspirin-containing medications, or ibuprofen products Recommend ongoing risk factor control by Primary Care Physician at time of discharge from inpatient rehabilitation.  Follow-up Kathlene November, MD in 2 weeks following discharge from rehab.  Follow-up with Dr. Rosalin Hawking, Stroke Clinic in 2 months, office to schedule an appointment.   35 minutes were spent preparing discharge.  Reynolds Seymour for Pager information 04/08/2016 1:36 PM   I, the attending vascular  neurologist, have personally obtained a history, examined the patient, evaluated laboratory data, individually viewed imaging studies and agree with radiology interpretations. I also discussed with husband regarding her care plan. Together with the NP/PA, we formulated the assessment and plan of care which reflects our mutual decision.  I have made any additions or clarifications directly to the above note and agree with the findings and plan as currently documented.    Rosalin Hawking, MD PhD Stroke Neurology 04/08/2016 10:02 PM

## 2016-04-08 NOTE — Care Management Note (Signed)
Case Management Note  Patient Details  Name: APIPHANY TROCHEZ MRN: [id number] Date of Birth: 05/07/1971  Subjective/Objective:                    Action/Plan: Pt discharging to CIR today. No further needs per CM.  Expected Discharge Date:                  Expected Discharge Plan:  [place]  In-House Referral:     Discharge planning Services  CM Consult  Post Acute Care Choice:    Choice offered to:     DME Arranged:    DME Agency:     HH Arranged:    [place] Agency:     Status of Service:  Completed, signed off  Medicare Important Message Given:    Date Medicare IM Given:    Medicare IM give by:    Date Additional Medicare IM Given:    Additional Medicare Important Message give by:     If discussed at [place] of Stay Meetings, dates discussed:    Additional Comments:  Pollie Friar, RN 04/08/2016, 11:08 AM

## 2016-04-08 NOTE — H&P (View-Only) (Deleted)
Physical Medicine and Rehabilitation Admission H&P    Chief Complaint  Patient presents with  . code stroke   : HPI: Abigail Campos is a 45 y.o. right handed female with history of hypertension and migraines. History taken from chart review. Lives with husband and has 2 children ages 20 and 55. One level home with level entry. Family members in the area to assist as needed. 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 Presented 04/01/2016 left facial droop with right gaze preference and headache. Systolic blood pressure greater than 200. Cranial CT scan showed focal area of hemorrhage arising in the posterior, lateral right basal ganglia measuring 4.5 x 3.4 x 3.4 cm with mild surrounding edema. Negative arterial findings on CTA of head and neck. Echocardiogram ejection fraction 35% grade 2 diastolic dysfunction. Bouts of hypokalemia supplement added. Neurology consulted with conservative care. Follow-up CT of the head 04/04/2016 stable with slightly increased localized vasogenic edema. 4 mm right to left midline shift not significantly changed. Decreasing small volume acute subarachnoid hemorrhage. Dysphagia 2 thin liquid diet . Close monitoring of blood pressure.Subcutaneous heparin for DVT prophylaxis initiated 04/07/2016 PhysicalAnd occupational therapy evaluations completed with recommendations of physical medicine rehabilitation consult.Patient was admitted for a comprehensive rehabilitation program  ROS Review of Systems  Unable to perform ROS: medical condition    Past Medical History  Diagnosis Date  . Headache(784.0)     developed migraines after 2nd pregnancy, had a negative CT of the head  . Hypertension   . Essential hypertension 10/19/2007    03-2010: metoprolol changed to bystolic (was not feeling well on it, no specific allergy or reaction)     Past Surgical History  Procedure Laterality Date  . Inguinal hernia repair      2003-01-27  . Cesarean section      [id number]  . Abdominal  hysterectomy  11-14-07    no oophorectomy per surgical report   Family History  Problem Relation Age of Onset  . Diabetes Mother   . Hypertension Mother   . Glaucoma Mother   . Heart disease      2 uncles died 2011-01-27 from CAD-CHF  . Stroke Maternal Grandfather   . Colon cancer Mother     mother age 6  . Diabetes Other     siblings  . Hypertension Other     siblings   Social History:  reports that she has never smoked. She has never used smokeless tobacco. She reports that she does not drink alcohol or use illicit drugs. Allergies:  Allergies  Allergen Reactions  . Bee Venom Anaphylaxis  . Peanut-Containing Drug Products Anaphylaxis  . Hydrocodone Hives    Generalize itching w/o rash  . Ambien [Zolpidem Tartrate] Other (See Comments)    forgetfulness  . Aspirin Swelling    Reports blisters w/ ASA but pt reports is ok w/ Motrin, Advil, naproxen  . Latex Hives   Medications Prior to Admission  Medication Sig Dispense Refill  . Cholecalciferol (VITAMIN D PO) Take 1 tablet by mouth daily.     . cyclobenzaprine (FLEXERIL) 10 MG tablet Take 1 tablet (10 mg total) by mouth 3 (three) times daily as needed for muscle spasms. 21 tablet 0  . famotidine (PEPCID) 20 MG tablet One at bedtime (Patient taking differently: Take 20 mg by mouth at bedtime. ) 30 tablet 2  . felodipine (PLENDIL) 10 MG 24 hr tablet Take 1 tablet (10 mg total) by mouth daily. 30 tablet 4  . furosemide (LASIX) 20  MG tablet Take 1 tablet (20 mg total) by mouth daily. 30 tablet 3  . Multiple Vitamin (MULTIVITAMIN) tablet Take 1 tablet by mouth 2 (two) times daily.      . Nebivolol HCl 20 MG TABS Take 1 tablet (20 mg total) by mouth 2 (two) times daily. 60 tablet 4  . pantoprazole (PROTONIX) 40 MG tablet Take 1 tablet (40 mg total) by mouth daily. Take 30-60 min before first meal of the day 30 tablet 2    Home: [place] expects to be discharged to:: Private residence Living Arrangements:  Spouse/significant other, Children (10/18 y.o children) Available Help at Discharge: Family Type of Home: House Home Access: Level entry Home Layout: One level Bathroom Shower/Tub: Producer, television/film/video: None Additional Comments: husband to take FMLA, and other family available as needed to help out  Lives With: Spouse   Functional History: Prior Function Level of Independence: Independent 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  Functional Status:  Mobility: Bed Mobility Overal bed mobility: Needs Assistance, + 2 for safety/equipment, +2 for physical assistance Bed Mobility: Supine to Sit, Sit to Supine Supine to sit: +2 for safety/equipment, +2 for physical assistance, Min assist, Mod assist Sit to supine: Min assist, Mod assist, +2 for physical assistance, +2 for safety/equipment General bed mobility comments: Pt tends to over shoot her intended direction moving quickly both into and out of bed.  She fluctuates between min and mod assist for safety, balance and transitions.  Transfers General transfer comment: NT- Dr. Leonie Man only approved EOB today.       ADL: ADL Overall ADL's : Needs assistance/impaired Eating/Feeding: NPO Grooming: Wash/dry hands, Wash/dry face, Oral care, Brushing hair, Total assistance Grooming Details (indicate cue type and reason): Pt was able to demonstrate correct use of toothbrush but did not engage in further ADL activity as she fatigued  Upper Body Bathing: Total assistance, Sitting, Bed level Lower Body Bathing: Total assistance, Bed level Upper Body Dressing : Total assistance, Sitting, Bed level Lower Body Dressing: Total assistance, Bed level Toilet Transfer: Total assistance Toilet Transfer Details (indicate cue type and reason): unable to attempt this date  Functional mobility during ADLs: Total assistance, +2 for physical assistance (bed mobility )  Cognition: Cognition Overall Cognitive Status: Difficult to assess Arousal/Alertness:  Lethargic Orientation Level: Oriented to person, Oriented to place, Oriented to situation, Disoriented to time Attention: Sustained Focused Attention: Appears intact Sustained Attention: Impaired Sustained Attention Impairment: Verbal basic, Functional basic Memory:  (to be assessed) Awareness: Impaired Awareness Impairment: Emergent impairment Problem Solving: Impaired Problem Solving Impairment: Functional basic Behaviors:  (suspect impulsivity once alertness improves) Safety/Judgment: Impaired Cognition Arousal/Alertness: Lethargic, Awake/alert Behavior During Therapy: Flat affect, Restless Overall Cognitive Status: Difficult to assess Area of Impairment: Attention, Following commands, Safety/judgement, Awareness, Problem solving Current Attention Level: Focused Following Commands: Follows one step commands inconsistently, Follows one step commands with increased time Safety/Judgement: Decreased awareness of safety, Decreased awareness of deficits Awareness: Intellectual Problem Solving: Requires tactile cues, Requires verbal cues, Decreased initiation, Difficulty sequencing, Slow processing General Comments: Pt follows one step commands ~30% of time with facilitation.  She spontaneously said "hey", but no other verbalization noted.  She is easily distracted Difficult to assess due to: Impaired communication  Physical Exam: Blood pressure 149/71, pulse 52, temperature 97.5 F (36.4 C), temperature source Axillary, resp. rate 18, height _0  (1.499 m), weight 73.6 kg (162 lb 4.1 oz), SpO2 98 %. Physical Exam Constitutional: She appears well-developed.  Obese  HENT:  Head:  Normocephalic and atraumatic.  Eyes: Conjunctivae and EOM are normal.  Neck: Normal range of motion. Neck supple. No thyromegaly present.  Cardiovascular: Normal rate and regular rhythm.  Respiratory: Effort normal and breath sounds normal. No respiratory distress.  GI: Soft. Bowel sounds are normal. She  exhibits no distension.  Musculoskeletal: She exhibits edema. She exhibits no tenderness.  Neurological: She is alert.  Appears to have a right gaze preference with left neglect. Inconsistently follows one-step commands Expressive greater than receptive aphasia (slightly improved) Dysarthria Left facial weakness DTRs symmetric Unable to assess sensation Motor:? Reliability, moving right upper and right lower extremity freely. Not moving left side.  Skin: Skin is warm and dry.  Psychiatric:  Unable to assess   Results for orders placed or performed during the hospital encounter of 04/01/16 (from the past 48 hour(s))  Triglycerides     Status: None   Collection Time: 04/05/16  7:39 AM  Result Value Ref Range   Triglycerides 66 <150 mg/dL  CBC     Status: Abnormal   Collection Time: 04/05/16  7:39 AM  Result Value Ref Range   WBC 15.0 (H) 4.0 - 10.5 K/uL   RBC 4.65 3.87 - 5.11 MIL/uL   Hemoglobin 13.4 12.0 - 15.0 g/dL   HCT 42.8 36.0 - 46.0 %   MCV 92.0 78.0 - 100.0 fL   MCH 28.8 26.0 - 34.0 pg   MCHC 31.3 30.0 - 36.0 g/dL   RDW 14.0 11.5 - 15.5 %   Platelets 250 150 - 400 K/uL  Basic metabolic panel     Status: Abnormal   Collection Time: 04/05/16  7:39 AM  Result Value Ref Range   Sodium 139 135 - 145 mmol/L   Potassium 3.5 3.5 - 5.1 mmol/L   Chloride 105 101 - 111 mmol/L   CO2 26 22 - 32 mmol/L   Glucose, Bld 138 (H) 65 - 99 mg/dL   BUN 10 6 - 20 mg/dL   Creatinine, Ser 0.74 0.44 - 1.00 mg/dL   Calcium 8.6 (L) 8.9 - 10.3 mg/dL   GFR calc non Af Amer >60 >60 mL/min   GFR calc Af Amer >60 >60 mL/min    Comment: (NOTE) The eGFR has been calculated using the CKD EPI equation. This calculation has not been validated in all clinical situations. eGFR's persistently <60 mL/min signify possible Chronic Kidney Disease.    Anion gap 8 5 - 15  Glucose, capillary     Status: Abnormal   Collection Time: 04/05/16  7:57 AM  Result Value Ref Range   Glucose-Capillary 115 (H) 65  - 99 mg/dL   Comment 1 Notify RN    Comment 2 Document in Chart   Glucose, capillary     Status: Abnormal   Collection Time: 04/05/16 12:32 PM  Result Value Ref Range   Glucose-Capillary 123 (H) 65 - 99 mg/dL  Glucose, capillary     Status: Abnormal   Collection Time: 04/05/16  3:55 PM  Result Value Ref Range   Glucose-Capillary 110 (H) 65 - 99 mg/dL  Glucose, capillary     Status: Abnormal   Collection Time: 04/05/16  7:39 PM  Result Value Ref Range   Glucose-Capillary 142 (H) 65 - 99 mg/dL  Glucose, capillary     Status: Abnormal   Collection Time: 04/05/16 11:27 PM  Result Value Ref Range   Glucose-Capillary 128 (H) 65 - 99 mg/dL  Glucose, capillary     Status: Abnormal   Collection Time: 04/06/16  3:17 AM  Result Value Ref Range   Glucose-Capillary 107 (H) 65 - 99 mg/dL  Magnesium     Status: None   Collection Time: 04/06/16  4:05 AM  Result Value Ref Range   Magnesium 2.2 1.7 - 2.4 mg/dL  Phosphorus     Status: None   Collection Time: 04/06/16  4:05 AM  Result Value Ref Range   Phosphorus 3.0 2.5 - 4.6 mg/dL  CBC     Status: Abnormal   Collection Time: 04/06/16  4:05 AM  Result Value Ref Range   WBC 12.1 (H) 4.0 - 10.5 K/uL   RBC 4.42 3.87 - 5.11 MIL/uL   Hemoglobin 12.7 12.0 - 15.0 g/dL   HCT 40.5 36.0 - 46.0 %   MCV 91.6 78.0 - 100.0 fL   MCH 28.7 26.0 - 34.0 pg   MCHC 31.4 30.0 - 36.0 g/dL   RDW 13.8 11.5 - 15.5 %   Platelets 240 150 - 400 K/uL  Basic metabolic panel     Status: Abnormal   Collection Time: 04/06/16  4:05 AM  Result Value Ref Range   Sodium 140 135 - 145 mmol/L   Potassium 3.4 (L) 3.5 - 5.1 mmol/L   Chloride 105 101 - 111 mmol/L   CO2 27 22 - 32 mmol/L   Glucose, Bld 117 (H) 65 - 99 mg/dL   BUN 9 6 - 20 mg/dL   Creatinine, Ser 0.75 0.44 - 1.00 mg/dL   Calcium 8.7 (L) 8.9 - 10.3 mg/dL   GFR calc non Af Amer >60 >60 mL/min   GFR calc Af Amer >60 >60 mL/min    Comment: (NOTE) The eGFR has been calculated using the CKD EPI equation. This  calculation has not been validated in all clinical situations. eGFR's persistently <60 mL/min signify possible Chronic Kidney Disease.    Anion gap 8 5 - 15  Glucose, capillary     Status: Abnormal   Collection Time: 04/06/16  8:14 AM  Result Value Ref Range   Glucose-Capillary 110 (H) 65 - 99 mg/dL  CBC     Status: Abnormal   Collection Time: 04/07/16  4:40 AM  Result Value Ref Range   WBC 11.6 (H) 4.0 - 10.5 K/uL   RBC 4.52 3.87 - 5.11 MIL/uL   Hemoglobin 13.0 12.0 - 15.0 g/dL   HCT 41.1 36.0 - 46.0 %   MCV 90.9 78.0 - 100.0 fL   MCH 28.8 26.0 - 34.0 pg   MCHC 31.6 30.0 - 36.0 g/dL   RDW 13.5 11.5 - 15.5 %   Platelets 262 150 - 400 K/uL  Basic metabolic panel     Status: Abnormal   Collection Time: 04/07/16  4:40 AM  Result Value Ref Range   Sodium 137 135 - 145 mmol/L   Potassium 3.3 (L) 3.5 - 5.1 mmol/L   Chloride 101 101 - 111 mmol/L   CO2 26 22 - 32 mmol/L   Glucose, Bld 120 (H) 65 - 99 mg/dL   BUN 10 6 - 20 mg/dL   Creatinine, Ser 0.84 0.44 - 1.00 mg/dL   Calcium 8.9 8.9 - 10.3 mg/dL   GFR calc non Af Amer >60 >60 mL/min   GFR calc Af Amer >60 >60 mL/min    Comment: (NOTE) The eGFR has been calculated using the CKD EPI equation. This calculation has not been validated in all clinical situations. eGFR's persistently <60 mL/min signify possible Chronic Kidney Disease.    Anion gap 10 5 -  15   No results found.  Medical Problem List and Plan: 1.Left hemiplegia, aphasia, dysphagia secondary to right basal ganglia hemorrhage secondary to hypertensive crisis 2.  DVT Prophylaxis/Anticoagulation: Subcutaneous heparin for DVT prophylaxis initiated 04/07/2016 3. Pain Management/migraine headaches: Fioricet for headaches and Robaxin. 4. Dysphagia. #2 thin liquid diet. Speech therapy follow-up 5. Neuropsych: This patient is capable of making decisions on her own behalf. 6. Skin/Wound Care: Routine skin checks 7. Fluids/Electrolytes/Nutrition: Routine I&O with follow-up  chemistries 8. Hypertension. Plendil 10 mg daily, lisinopril 20 mg twice a day, Bysystolic 20 mg every 12 hours, Hydralazine 10 mg 3 times a day. Monitor with increased mobility 9. Hyperlipidemia. Lipitor  Post Admission Physician Evaluation: 1. Functional deficits secondary  to right basal ganglia hemorrhage. 2. Patient is admitted to receive collaborative, interdisciplinary care between the physiatrist, rehab nursing staff, and therapy team. 3. Patient's level of medical complexity and substantial therapy needs in context of that medical necessity cannot be provided at a lesser intensity of care such as a SNF. 4. Patient has experienced substantial functional loss from his/her baseline which was documented above under the "Functional History" and "Functional Status" headings.  Judging by the patient's diagnosis, physical exam, and functional history, the patient has potential for functional progress which will result in measurable gains while on inpatient rehab.  These gains will be of substantial and practical use upon discharge  in facilitating mobility and self-care at the household level. 5. Physiatrist will provide 24 hour management of medical needs as well as oversight of the therapy plan/treatment and provide guidance as appropriate regarding the interaction of the two. 24 hour rehab nursing will assist with bladder management, safety, skin/wound care, disease management, medication administration and patient education 6.  and help integrate therapy concepts, techniques,education, etc. 7. PT will assess and treat for/with: Lower extremity strength, range of motion, stamina, balance, functional mobility, safety, adaptive techniques and equipment, coping skills, pain control, stroke education.   Goals are: Min A/Supervision. 8. OT will assess and treat for/with: ADL's, functional mobility, safety, upper extremity strength, adaptive techniques and equipment, ego support, and community  reintegration.   Goals are: Min A/Supervision. Therapy may proceed with showering this patient. 9. SLP will assess and treat for/with: swallowing, cognition, speech.  Goals are: Min A. 10. Case Management and Social Worker will assess and treat for psychological issues and discharge planning. 11. Team conference will be held weekly to assess progress toward goals and to determine barriers to discharge. 12. Patient will receive at least 3 hours of therapy per day at least 5 days per week. 13. ELOS: 16-19 days.       14. Prognosis:  good  Delice Lesch, MD 04/07/2016

## 2016-04-08 NOTE — Progress Notes (Signed)
Rehab admissions - I have approval from [facility] for acute inpatient rehab admission for today.  I have medical clearance and will plan inpatient rehab admission for today.  Call me for questions.  RC:[id number]

## 2016-04-09 ENCOUNTER — Inpatient Hospital Stay (HOSPITAL_COMMUNITY): Payer: Managed Care, Other (non HMO) | Admitting: Occupational Therapy

## 2016-04-09 ENCOUNTER — Inpatient Hospital Stay (HOSPITAL_COMMUNITY): Payer: Managed Care, Other (non HMO) | Admitting: Speech Pathology

## 2016-04-09 ENCOUNTER — Inpatient Hospital Stay (HOSPITAL_COMMUNITY): Payer: Managed Care, Other (non HMO) | Admitting: Physical Therapy

## 2016-04-09 DIAGNOSIS — M6249 Contracture of muscle, multiple sites: Secondary | ICD-10-CM

## 2016-04-09 DIAGNOSIS — M62838 Other muscle spasm: Secondary | ICD-10-CM | POA: Insufficient documentation

## 2016-04-09 LAB — CBC
HCT: 39.2 % (ref 36.0–46.0)
Hemoglobin: 12.5 g/dL (ref 12.0–15.0)
MCH: 29.6 pg (ref 26.0–34.0)
MCHC: 31.9 g/dL (ref 30.0–36.0)
MCV: 92.7 fL (ref 78.0–100.0)
Platelets: 228 10*3/uL (ref 150–400)
RBC: 4.23 MIL/uL (ref 3.87–5.11)
RDW: 13.6 % (ref 11.5–15.5)
WBC: 8.9 10*3/uL (ref 4.0–10.5)

## 2016-04-09 LAB — CBC WITH DIFFERENTIAL/PLATELET
HCT: 14.4 % — ABNORMAL LOW (ref 36.0–46.0)
Hemoglobin: 4.5 g/dL — CL (ref 12.0–15.0)
MCH: 29 pg (ref 26.0–34.0)
MCHC: 31.3 g/dL (ref 30.0–36.0)
MCV: 92.9 fL (ref 78.0–100.0)
RBC: 1.55 MIL/uL — ABNORMAL LOW (ref 3.87–5.11)
RDW: 13.8 % (ref 11.5–15.5)
WBC: 3.2 10*3/uL — ABNORMAL LOW (ref 4.0–10.5)

## 2016-04-09 MED ORDER — BACLOFEN 5 MG HALF TABLET
5.0000 mg | ORAL_TABLET | Freq: Three times a day (TID) | ORAL | Status: DC
  Administered 2016-04-09 – 2016-04-22 (×38): 5 mg via ORAL
  Filled 2016-04-09 (×40): qty 1

## 2016-04-09 NOTE — Evaluation (Signed)
Occupational Therapy Assessment and Plan  Patient Details  Name: Abigail Campos MRN: [id number] Date of Birth: 01-18-1971  OT Diagnosis: abnormal posture, cognitive deficits, disturbance of vision, hemiplegia affecting non-dominant side and coordination disorder, flaccid Rehab Potential: Rehab Potential (ACUTE ONLY): Fair ELOS: 21-24 days   Today's Date: 04/09/2016 OT Individual Time: 6314-9702 and 1532-1600 OT Individual Time Calculation (min): 57 min   And 28 min   Problem List:  Patient Active Problem List   Diagnosis Date Noted  . Muscle spasticity   . Hypertensive emergency 04/08/2016  . Obesity (BMI 30.0-34.9) 04/08/2016  . Hyperlipidemia 04/08/2016  . Basal ganglia hemorrhage ([place]) 04/08/2016  . Aphasia as late effect of cerebrovascular accident   . Dysphagia as late effect of cerebrovascular disease   . Migraine with aura and without status migrainosus, not intractable   . HLD (hyperlipidemia)   . Benign essential HTN   . Gait disturbance, post-stroke   . Hemiplegia, post-stroke ([place])   . Hypokalemia   . Dysphagia, post-stroke   . Aphasia, post-stroke   . Bradycardia   . Headache, migraine   . Dysarthria, post-stroke   . Encephalopathy acute 04/02/2016  . ICH (intracerebral hemorrhage) (HCC) - R basal ganglia due to hypertensive emergency 04/01/2016  . Upper airway cough syndrome 12/06/2015  . PCP NOTES >>> 09/25/2015  . Acute bronchitis 11/06/2014  . Need for hepatitis B vaccination 10/19/2014  . Insomnia 12/28/2012  . Annual physical exam 11/23/2011  . Hemorrhoids 10/13/2011  . Morbid obesity ([place]) 02/26/2011  . CHEST PAIN UNSPECIFIED 03/28/2010  . Essential hypertension 10/19/2007  . Headache(784.0) 05/12/2007    Past Medical History:  Past Medical History  Diagnosis Date  . Headache(784.0)     developed migraines after 2nd pregnancy, had a negative CT of the head  . Hypertension   . Essential hypertension 10/19/2007    03-2010: metoprolol changed  to bystolic (was not feeling well on it, no specific allergy or reaction)     Past Surgical History:  Past Surgical History  Procedure Laterality Date  . Inguinal hernia repair      2004  . Cesarean section      [id number]  . Abdominal hysterectomy  11-14-07    no oophorectomy per surgical report    Assessment & Plan Clinical Impression: Patient is a 45 y.o. year old female with history of hypertension and migraines. History taken from chart review. Lives with husband and has 2 children ages 13 and 43. One level home with level entry. Family members in the area to assist as needed. 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 Presented 04/01/2016 left facial droop with right gaze preference and headache. Systolic blood pressure greater than 200. Cranial CT scan showed focal area of hemorrhage arising in the posterior, lateral right basal ganglia measuring 4.5 x 3.4 x 3.4 cm with mild surrounding edema. Negative arterial findings on CTA of head and neck. Echocardiogram ejection fraction 63% grade 2 diastolic dysfunction. Bouts of hypokalemia supplement added. Neurology consulted with conservative care. Follow-up CT of the head 04/04/2016 stable with slightly increased localized vasogenic edema. 4 mm right to left midline shift not significantly changed. Decreasing small volume acute subarachnoid hemorrhage. Dysphagia 2 thin liquid diet . Close monitoring of blood pressure.Subcutaneous heparin for DVT prophylaxis initiated 04/07/2016 PhysicalAnd occupational therapy evaluations completed with recommendations of physical medicine rehabilitation consult.Patient was admitted for a comprehensive rehabilitation program. Patient transferred to CIR on 04/08/2016 .    Patient currently requires total with basic self-care skills secondary to  muscle weakness, decreased cardiorespiratoy endurance, abnormal tone, decreased coordination and decreased motor planning, L inattention, decreased motor planning, decreased initiation,  decreased attention, decreased awareness, decreased problem solving, decreased safety awareness, decreased memory and delayed processing and decreased sitting balance, decreased standing balance, decreased postural control, hemiplegia and decreased balance strategies.  Prior to hospitalization, patient could complete ADLs and IADLs with independent .  Patient will benefit from skilled intervention to decrease level of assist with basic self-care skills prior to discharge home with care partner.  Anticipate patient will require 24 hour supervision and moderate physical assestance and follow up home health.  OT - End of Session Endurance Deficit: Yes Endurance Deficit Description: patient falling asleep sitting edge of bed, required max multimodal cues to keep eyes open throughout session OT Assessment Rehab Potential (ACUTE ONLY): Fair Barriers to Discharge:  (none known at this time) OT Patient demonstrates impairments in the following area(s): Balance;Behavior;Cognition;Endurance;Motor;Pain;Perception;Safety;Sensory;Vision OT Basic ADL's Functional Problem(s): Eating;Grooming;Bathing;Dressing;Toileting OT Advanced ADL's Functional Problem(s):  (n/a) OT Transfers Functional Problem(s): Toilet;Tub/Shower OT Additional Impairment(s): Fuctional Use of Upper Extremity OT Plan OT Intensity: Minimum of 1-2 x/day, 45 to 90 minutes OT Frequency: 5 out of 7 days OT Duration/Estimated Length of Stay: 21-24 days OT Treatment/Interventions: Balance/vestibular training;Cognitive remediation/compensation;Community reintegration;Discharge planning;DME/adaptive equipment instruction;Psychosocial support;UE/LE Strength taining/ROM;Therapeutic Exercise;Patient/family education;Therapeutic Activities;Neuromuscular re-education;Splinting/orthotics;Wheelchair propulsion/positioning;Visual/perceptual remediation/compensation;Functional mobility training;Functional electrical stimulation;Self Care/advanced ADL  retraining;UE/LE Coordination activities OT Self Feeding Anticipated Outcome(s): set up A OT Basic Self-Care Anticipated Outcome(s): min - mod A OT Toileting Anticipated Outcome(s): mod A overall OT Bathroom Transfers Anticipated Outcome(s): mod A overall OT Recommendation Recommendations for Other Services: Other (comment) (none at this time) Patient destination: Home Follow Up Recommendations: Home health OT;24 hour supervision/assistance Equipment Recommended: To be determined   Skilled Therapeutic Intervention Session 1: Upon entering the room, pt supine in bed with c/o 10/10 pain described as headache. Medication given during this session. However, pt having increased difficulty with attention to task and holding head throughout session. Pt performed supine >sit with total A to EOB. Pt sitting on EOB with bathing task for UB with min - total A for sitting balance secondary to fatigue. Hand over hand technique utilized for L UE in task. Pt donning clean gown at end of session. Multiple rest breaks secondary to fatigue. Max verbal cues for initiation, sequencing, and attention. Pt returned to supine and quickly falling asleep. Call bell and all needed items within reach upon exiting the room.   Session 2: Upon entering the room, pt supine in bed sleeping with family present in the room. OT having difficulty arousing pt this afternoon and utilizing cold wash cloth to awaken pt. Pt continues to be lethargic throughout session. Pt not oriented to person, place, situation, or time. Pt refers to therapist as "mommy". Ot providing education to husband and mother regarding L inattention and OT setting up room in order to facilitate pt looking to the L. OT also educating family on OT purpose, POC, and goals with them verbalizing understanding and asking questions as appropriate. Bed alarm activated and call bell within reach upon exiting the room.   OT Evaluation Precautions/Restrictions   Precautions Precautions: Fall Precaution Comments: left hemi, L inattention, impulsive Restrictions Weight Bearing Restrictions: No  Pain Pain Assessment Pain Score: 10-Worst pain ever Pain Type: Acute pain Pain Location: Head Pain Descriptors / Indicators: Headache;Aching Pain Intervention(s): RN made aware;Medication (See eMAR);Emotional support;Distraction Home Living/Prior Functioning Home Living Family/patient expects to be discharged to:: Private residence Living Arrangements: Spouse/significant other, Children  Available Help at Discharge: Family, Other (Comment) (husband taking FMLA) Type of Home: House Home Access: Stairs to enter Technical brewer of Steps: 1 Entrance Stairs-Rails: None Home Layout: One level Bathroom Shower/Tub: Tub/shower unit, Industrial/product designer: Yes  Lives With: Spouse, Other (Comment) (2 children - ages 5 and 60) Prior Function Level of Independence: Independent with basic ADLs, Independent with homemaking with ambulation, Independent with homemaking with wheelchair, Independent with gait, Independent with transfers Vocation: Full time employment 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 R gaze preference.  Cognition Overall Cognitive Status: Impaired/Different from baseline Arousal/Alertness: Lethargic Orientation Level: Person Year: 2017 Month: March Day of Week: Incorrect Memory: Impaired Memory Impairment: Decreased recall of new information Immediate Memory Recall: Sock;Blue;Bed Memory Recall: Sock;Blue (unable to state bed with cue) Memory Recall Sock: With Cue Memory Recall Blue: With Cue Attention: Sustained Focused Attention: Appears intact Sustained Attention: Impaired Sustained Attention  Impairment: Verbal basic;Functional basic Awareness: Impaired Awareness Impairment: Intellectual impairment Problem Solving: Impaired Problem Solving Impairment: Functional basic Executive Function:  (all impaired due to lower level deficits ) Behaviors: Restless;Verbal agitation;Poor frustration tolerance;Perseveration;Impulsive Safety/Judgment: Impaired Sensation Sensation Light Touch: Impaired by gross assessment Stereognosis: Not tested Hot/Cold: Impaired by gross assessment Proprioception: Impaired by gross assessment Additional Comments: UTA due to cognition and expressive aphasia, anticipate impaired LUE/LLE  Coordination Gross Motor Movements are Fluid and Coordinated: No Fine Motor Movements are Fluid and Coordinated: No Coordination and Movement Description: LUE/LLE hemiplegia Motor  Motor Motor: Abnormal postural alignment and control;Hemiplegia Mobility  Bed Mobility Bed Mobility: Rolling Right;Rolling Left;Supine to Sit;Sit to Supine Rolling Right: 1: +1 Total assist Rolling Left: 1: +1 Total assist Supine to Sit: 1: +1 Total assist Sit to Supine: 1: +2 Total assist Transfers Sit to Stand: 1: +2 Total assist Stand to Sit: 1: +2 Total assist  Trunk/Postural Assessment  Cervical Assessment Cervical Assessment: Exceptions to [facility] (forward head) Thoracic Assessment Thoracic Assessment: Within Functional Limits Lumbar Assessment Lumbar Assessment: Exceptions to [facility] (posterior pelvic tilt) Postural Control Postural Control: Deficits on evaluation Protective Responses: absent  Balance Balance Balance Assessed: Yes Static Sitting Balance Static Sitting - Balance Support: Right upper extremity supported;Feet supported Static Sitting - Level of Assistance: 2: Max assist;5: Stand by assistance;3: Mod assist;4: Min assist;1: +1 Total assist Static Standing Balance Static Standing - Balance Support: During functional activity;Bilateral upper extremity supported (3  musketeers) Static Standing - Level of Assistance: 1: +2 Total assist Extremity/Trunk Assessment RUE Assessment RUE Assessment: Within Functional Limits LUE Assessment LUE Assessment: Exceptions to [facility] LUE Strength LUE Overall Strength: Deficits LUE Overall Strength Comments: 0/5 throughout LUE Tone LUE Tone: Flaccid   See Function Navigator for Current Functional Status.   Refer to Care Plan for Long Term Goals  Recommendations for other services: None  Discharge Criteria: Patient will be discharged from OT if patient refuses treatment 3 consecutive times without medical reason, if treatment goals not met, if there is a change in medical status, if patient makes no progress towards goals or if patient is discharged from hospital.  The above assessment, treatment plan, treatment alternatives and goals were discussed and mutually agreed upon: by family  Phineas Semen 04/09/2016, 12:54 PM

## 2016-04-09 NOTE — Evaluation (Signed)
Speech Language Pathology Assessment and Plan  Patient Details  Name: Abigail Campos MRN: [id number] Date of Birth: June 03, 1971  SLP Diagnosis: Aphasia;Dysarthria;Dysphagia;Cognitive Impairments  Rehab Potential: Excellent ELOS: 21-24 days     Today's Date: 04/09/2016 SLP Individual Time: 3818-4037 SLP Individual Time Calculation (min): 57 min   Problem List:  Patient Active Problem List   Diagnosis Date Noted  . Muscle spasticity   . Hypertensive emergency 04/08/2016  . Obesity (BMI 30.0-34.9) 04/08/2016  . Hyperlipidemia 04/08/2016  . Basal ganglia hemorrhage ([place]) 04/08/2016  . Aphasia as late effect of cerebrovascular accident   . Dysphagia as late effect of cerebrovascular disease   . Migraine with aura and without status migrainosus, not intractable   . HLD (hyperlipidemia)   . Benign essential HTN   . Gait disturbance, post-stroke   . Hemiplegia, post-stroke ([place])   . Hypokalemia   . Dysphagia, post-stroke   . Aphasia, post-stroke   . Bradycardia   . Headache, migraine   . Dysarthria, post-stroke   . Encephalopathy acute 04/02/2016  . ICH (intracerebral hemorrhage) (HCC) - R basal ganglia due to hypertensive emergency 04/01/2016  . Upper airway cough syndrome 12/06/2015  . PCP NOTES >>> 09/25/2015  . Acute bronchitis 11/06/2014  . Need for hepatitis B vaccination 10/19/2014  . Insomnia 12/28/2012  . Annual physical exam 11/23/2011  . Hemorrhoids 10/13/2011  . Morbid obesity ([place]) 02/26/2011  . CHEST PAIN UNSPECIFIED 03/28/2010  . Essential hypertension 10/19/2007  . Headache(784.0) 05/12/2007   Past Medical History:  Past Medical History  Diagnosis Date  . Headache(784.0)     developed migraines after 2nd pregnancy, had a negative CT of the head  . Hypertension   . Essential hypertension 10/19/2007    03-2010: metoprolol changed to bystolic (was not feeling well on it, no specific allergy or reaction)     Past Surgical History:  Past Surgical  History  Procedure Laterality Date  . Inguinal hernia repair      2004  . Cesarean section      [id number]  . Abdominal hysterectomy  11-14-07    no oophorectomy per surgical report    Assessment / Plan / Recommendation Clinical Impression Patient is a 45 y.o. right handed female with history of hypertension and migraines. Presented 04/01/2016 left facial droop with right gaze preference and headache. Systolic blood pressure greater than 200. Cranial CT scan showed focal area of hemorrhage arising in the posterior, lateral right basal ganglia measuring 4.5 x 3.4 x 3.4 cm with mild surrounding edema. Neurology consulted with conservative care. Follow-up CT of the head 04/04/2016 stable with slightly increased localized vasogenic edema. 4 mm right to left midline shift not significantly changed. Decreasing small volume acute subarachnoid hemorrhage. PhysicalAnd occupational therapy evaluations completed with recommendations of physical medicine rehabilitation consult. Patient was admitted for a comprehensive rehabilitation program on 4/26.  Patient demonstrates severe cognitive impairments impacting sustained attention, attention to left field of environment, initiation, problem solving, recall, intellectual awareness of deficits and overall safety due to impulsivity and poor frustration tolerance which impacts her ability to complete functional tasks safely. Patient also demonstrates a moderate-severe global aphasia impacting all four modalities of language. Patient demonstrates difficulty following commands, answering basic yes/no questions and expressing her basic wants/needs. Her ability to express her wants and needs is also impacted by decreased speech intelligibility at the word and phrase level due to left lingual and labial weakness and ROM. Patient would benefit from skilled SLP intervention to maximize her cognitive-linguistic and  swallowing function as well as her speech intelligibility in order  to maximize her overall functional independence prior to discharge.    Skilled Therapeutic Interventions          Administered a cognitive-linguistic evaluation and BSE. Please see above for details.   SLP Assessment  Patient will need skilled Speech Lanaguage Pathology Services during CIR admission    Recommendations  SLP Diet Recommendations: Dysphagia 2 (Fine chop);Thin Liquid Administration via: Cup;Straw Medication Administration: Whole meds with puree Compensations: Slow rate;Small sips/bites;Follow solids with liquid;Lingual sweep for clearance of pocketing Postural Changes and/or Swallow Maneuvers: Seated upright 90 degrees;Upright 30-60 min after meal Oral Care Recommendations: Oral care BID Recommendations for Other Services: Neuropsych consult Patient destination: Home Follow up Recommendations: Home Health SLP;Outpatient SLP;24 hour supervision/assistance Equipment Recommended: None recommended by SLP    SLP Frequency 3 to 5 out of 7 days   SLP Duration  SLP Intensity  SLP Treatment/Interventions 21-24 days   Minumum of 1-2 x/day, 30 to 90 minutes  Cognitive remediation/compensation;Cueing hierarchy;Dysphagia/aspiration precaution training;Functional tasks;Patient/family education;Therapeutic Activities;Speech/Language facilitation;Environmental controls;Internal/external aids    Pain Pain Assessment Pain Assessment: 0-10 Faces Pain Scale: Hurts a little bit  Prior Functioning Type of Home: House  Lives With: Spouse;Family (2 children - ages 57 and 68) Available Help at Discharge: Family (husband taking FMLA) Vocation: Full time employment  Function:  Eating Eating   Modified Consistency Diet: Yes Eating Assist Level: Set up assist for;Supervision or verbal cues;Helper checks for pocketed food;Helper scoops food on utensil   Eating Set Up Assist For: Opening containers;Cutting food Helper [place] on Utensil: Occasionally     Cognition Comprehension  Comprehension assist level: Understands basic less than 25% of the time/ requires cueing >75% of the time  Expression   Expression assist level: Expresses basis less than 25% of the time/requires cueing >75% of the time.  Social Interaction Social Interaction assist level: Interacts appropriately 25 - 49% of time - Needs frequent redirection.  Problem Solving Problem solving assist level: Solves basic less than 25% of the time - needs direction nearly all the time or does not effectively solve problems and may need a restraint for safety  Memory Memory assist level: Recognizes or recalls less than 25% of the time/requires cueing greater than 75% of the time   Short Term Goals: Week 1: SLP Short Term Goal 1 (Week 1): Patient will consume current diet with minimal overt s/s of aspiration with Mod A verbal cues for use of swallowing strategies.  SLP Short Term Goal 2 (Week 1): Patient will demonstrate sustained attention to functional tasks for 5 minutes with Min A verbal cues for redirection.  SLP Short Term Goal 3 (Week 1): Patient will attend to left field of enviornment during functional tasks with Mod A verbal ces.  SLP Short Term Goal 4 (Week 1): Patient will answer basic yes/no questions with 50% accuracy in regards to wants/needs with Mod A multimodal cues.  SLP Short Term Goal 5 (Week 1): Patient will utilize speech intelligibility strategies at the word level with Max A verbal cues to achieve 50% intelligibility.,  SLP Short Term Goal 6 (Week 1): Patient will name common objects with 50% accuracy with Max A multimodal cues.   Refer to Care Plan for Long Term Goals  Recommendations for other services: Neuropsych  Discharge Criteria: Patient will be discharged from SLP if patient refuses treatment 3 consecutive times without medical reason, if treatment goals not met, if there is a change in medical  status, if patient makes no progress towards goals or if patient is discharged from  hospital.  The above assessment, treatment plan, treatment alternatives and goals were discussed and mutually agreed upon: by patient and by family  ,  04/09/2016, 9:38 PM

## 2016-04-09 NOTE — Progress Notes (Addendum)
[facility] PHYSICAL MEDICINE & REHABILITATION     PROGRESS NOTE  Subjective/Complaints:  Patient seen sleeping in bed this morning. She is easily arousable.  ROS: Unable to obtain due to mental condition.  Objective: Vital Signs: Blood pressure 145/85, pulse 68, temperature 98.4 F (36.9 C), temperature source Oral, resp. rate 16, SpO2 97 %. No results found.  Recent Labs  04/09/16 0530 04/09/16 0700  WBC 3.2* 8.9  HGB 4.5* 12.5  HCT 14.4* 39.2  PLT QUESTIONABLE RESULTS, RECOMMEND RECOLLECT TO VERIFY 228    Recent Labs  04/07/16 0440 04/08/16 0445  NA 137 139  K 3.3* 3.4*  CL 101 103  GLUCOSE 120* 121*  BUN 10 11  CREATININE 0.84 0.94  CALCIUM 8.9 9.0   CBG (last 3)  No results for input(s): GLUCAP in the last 72 hours.  Wt Readings from Last 3 Encounters:  04/06/16 73.6 kg (162 lb 4.1 oz)  01/22/16 74.9 kg (165 lb 2 oz)  12/06/15 73.483 kg (162 lb)    Physical Exam:  BP 145/85 mmHg  Pulse 68  Temp(Src) 98.4 F (36.9 C) (Oral)  Resp 16  SpO2 97% Constitutional: She appears well-developed. Obese. NAD. Vital signs reviewed. HENT:  Normocephalic and atraumatic.   Eyes: Conjunctivae and EOM are normal.     Cardiovascular: Normal rate and regular rhythm.    Respiratory: Effort normal and breath sounds normal. No respiratory distress.   GI: Soft. Bowel sounds are normal. She exhibits no distension.  Musculoskeletal: She exhibits edema. She exhibits no tenderness.  Neurological: She is alert.  Appears to have a right gaze preference with left neglect. Inconsistently follows commands Expressive greater than receptive aphasia Dysarthria Left facial weakness Motor:? Reliability, moving right upper and right lower extremity freely. Not moving left side.  MAS 1/4 left Elbow flexion extremity Skin: Skin is warm and dry.  Psychiatric:  Unable to assess    Assessment/Plan: 1. Functional deficits secondary to right basal ganglia hemorrhage which require 3+  hours per day of interdisciplinary therapy in a comprehensive inpatient rehab setting. Physiatrist is providing close team supervision and 24 hour management of active medical problems listed below. Physiatrist and rehab team continue to assess barriers to discharge/monitor patient progress toward functional and medical goals.  Function:  Bathing Bathing position      Bathing parts      Bathing assist        Upper Body Dressing/Undressing Upper body dressing                    Upper body assist        Lower Body Dressing/Undressing Lower body dressing                                  Lower body assist        Toileting Toileting          Toileting assist     Transfers Chair/bed transfer             Locomotion Ambulation           Wheelchair          Cognition Comprehension    Expression    Social Interaction    Problem Solving    Memory       Medical Problem List and Plan: 1.Left hemiplegia, aphasia, dysphagia secondary to right basal ganglia hemorrhage secondary to hypertensive crisis on 04/01/16  Begin CIR  Will consider ordering PRAFO for left lower extremity and splints for left upper extremity if no noted movement in therapies  2. DVT Prophylaxis/Anticoagulation: Subcutaneous heparin for DVT prophylaxis initiated 04/07/2016 3. Pain Management/migraine headaches: Fioricet for headaches and Robaxin. 4. Dysphagia. #2 thin liquid diet. Speech therapy follow-up. Will advance diet as tolerated 5. Neuropsych: This patient is not capable of making decisions on her own behalf. 6. Skin/Wound Care: Routine skin checks 7. Fluids/Electrolytes/Nutrition: Routine I&O   BMP within normal range of 4/27 8. Hypertension. Plendil 10 mg daily, lisinopril 20 mg twice a day, Bysystolic 20 mg every 12 hours, Hydralazine 10 mg 3 times a day. Monitor with increased mobility 9. Hyperlipidemia. Lipitor 10. Spasticity  Will start baclofen 5 mg on  4/27  LOS (Days) 1 A FACE TO FACE EVALUATION WAS PERFORMED  Quin Mathenia [name] 04/09/2016 8:18 AM

## 2016-04-09 NOTE — Progress Notes (Signed)
CRITICAL VALUE ALERT  Critical value received:  HGB 4.5  Date of notification:  04/09/16  Time of notification:  0634  Critical value read back:Yes  Nurse who received alert:  Lynett Fish, RN  MD notified (1st page):  Helyn Numbers., PA-spoke with face to face  Time of first page:  [phone]  MD notified (2nd page):  Time of second page:  Responding MD:  Creta Levin  Time MD responded:  [phone]

## 2016-04-09 NOTE — Progress Notes (Signed)
Pts behavior and affect fluctuates; pds of extreme restlessness, mild agitation this evening. Assessed toileting needs with increased restlessness, scanned for 112cc. Tearful this AM;  pds of childlike, animated  behavior this eve with visitors present. Did verbalize toileting needs later this eve, continent urine. Family educated re: appropriate interactions, limit visitors, noise.

## 2016-04-09 NOTE — Evaluation (Signed)
Physical Therapy Assessment and Plan  Patient Details  Name: Abigail Campos MRN: [id number] Date of Birth: Sep 23, 1971  PT Diagnosis: Abnormal posture, Abnormality of gait, Cognitive deficits, Coordination disorder, Edema, Hemiplegia non-dominant, Hypotonia, Impaired sensation and Muscle weakness Rehab Potential: Good ELOS: 21-24 days   Today's Date: 04/09/2016 PT Individual Time: 1300-1400  60 min   Problem List:  Patient Active Problem List   Diagnosis Date Noted  . Muscle spasticity   . Hypertensive emergency 04/08/2016  . Obesity (BMI 30.0-34.9) 04/08/2016  . Hyperlipidemia 04/08/2016  . Basal ganglia hemorrhage ([place]) 04/08/2016  . Aphasia as late effect of cerebrovascular accident   . Dysphagia as late effect of cerebrovascular disease   . Migraine with aura and without status migrainosus, not intractable   . HLD (hyperlipidemia)   . Benign essential HTN   . Gait disturbance, post-stroke   . Hemiplegia, post-stroke ([place])   . Hypokalemia   . Dysphagia, post-stroke   . Aphasia, post-stroke   . Bradycardia   . Headache, migraine   . Dysarthria, post-stroke   . Encephalopathy acute 04/02/2016  . ICH (intracerebral hemorrhage) (HCC) - R basal ganglia due to hypertensive emergency 04/01/2016  . Upper airway cough syndrome 12/06/2015  . PCP NOTES >>> 09/25/2015  . Acute bronchitis 11/06/2014  . Need for hepatitis B vaccination 10/19/2014  . Insomnia 12/28/2012  . Annual physical exam 11/23/2011  . Hemorrhoids 10/13/2011  . Morbid obesity ([place]) 02/26/2011  . CHEST PAIN UNSPECIFIED 03/28/2010  . Essential hypertension 10/19/2007  . Headache(784.0) 05/12/2007    Past Medical History:  Past Medical History  Diagnosis Date  . Headache(784.0)     developed migraines after 2nd pregnancy, had a negative CT of the head  . Hypertension   . Essential hypertension 10/19/2007    03-2010: metoprolol changed to bystolic (was not feeling well on it, no specific allergy or  reaction)     Past Surgical History:  Past Surgical History  Procedure Laterality Date  . Inguinal hernia repair      2004  . Cesarean section      [id number]  . Abdominal hysterectomy  11-14-07    no oophorectomy per surgical report    Assessment & Plan Clinical Impression: Abigail Campos is a 45 y.o. right handed female with history of hypertension and migraines. History taken from chart review. Lives with husband and has 2 children ages 77 and 37. One level home with level entry. Family members in the area to assist as needed. 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 Presented 04/01/2016 left facial droop with right gaze preference and headache. Systolic blood pressure greater than 200. Cranial CT scan showed focal area of hemorrhage arising in the posterior, lateral right basal ganglia measuring 4.5 x 3.4 x 3.4 cm with mild surrounding edema. Negative arterial findings on CTA of head and neck. Echocardiogram ejection fraction 60% grade 2 diastolic dysfunction. Bouts of hypokalemia supplement added. Neurology consulted with conservative care. Follow-up CT of the head 04/04/2016 stable with slightly increased localized vasogenic edema. 4 mm right to left midline shift not significantly changed. Decreasing small volume acute subarachnoid hemorrhage. Dysphagia 2 thin liquid diet . Close monitoring of blood pressure.Subcutaneous heparin for DVT prophylaxis initiated 04/07/2016. Patient transferred to CIR on 04/08/2016.   Patient currently requires total with mobility secondary to muscle weakness, muscle joint tightness and muscle paralysis, decreased cardiorespiratoy endurance, impaired timing and sequencing, abnormal tone, unbalanced muscle activation, decreased coordination and decreased motor planning, decreased midline orientation and decreased attention to  left, decreased initiation, decreased attention, decreased awareness, decreased problem solving, decreased safety awareness, decreased memory and  delayed processing and decreased sitting balance, decreased standing balance, decreased postural control, hemiplegia and decreased balance strategies.  Prior to hospitalization, patient was independent  with mobility and lived with Spouse, Family (2 children - ages 31 and 35) in a House home.  Home access is 1Stairs to enter.  Patient will benefit from skilled PT intervention to maximize safe functional mobility, minimize fall risk and decrease caregiver burden for planned discharge home with 24 hour assist.  Anticipate patient will benefit from follow up [facility] at discharge.  PT - End of Session Activity Tolerance: Decreased this session;Tolerates 10 - 20 min activity with multiple rests Endurance Deficit: Yes Endurance Deficit Description: patient falling asleep sitting edge of bed, required max multimodal cues to keep eyes open throughout session PT Assessment Rehab Potential (ACUTE/IP ONLY): Good PT Patient demonstrates impairments in the following area(s): Balance;Behavior;Edema;Endurance;Motor;Nutrition;Pain;Perception;Safety;Sensory PT Transfers Functional Problem(s): Bed Mobility;Bed to Chair;Car;Furniture PT Locomotion Functional Problem(s): Ambulation;Wheelchair Mobility;Stairs PT Plan PT Intensity: Minimum of 1-2 x/day ,45 to 90 minutes PT Frequency: 5 out of 7 days PT Duration Estimated Length of Stay: 21-24 days PT Treatment/Interventions: Ambulation/gait training;Balance/vestibular training;Cognitive remediation/compensation;Community reintegration;Discharge planning;Disease management/prevention;DME/adaptive equipment instruction;Functional electrical stimulation;Functional mobility training;Neuromuscular re-education;Pain management;Patient/family education;Psychosocial support;Splinting/orthotics;Stair training;Therapeutic Activities;Therapeutic Exercise;Visual/perceptual remediation/compensation;UE/LE Coordination activities;UE/LE Strength taining/ROM;Wheelchair  propulsion/positioning PT Transfers Anticipated Outcome(s): mod A PT Locomotion Anticipated Outcome(s): mod A wheelchair level  PT Recommendation Follow Up Recommendations: Home health PT;24 hour supervision/assistance Patient destination: Home Equipment Recommended: To be determined  Skilled Therapeutic Intervention Skilled therapeutic intervention initiated after completion of evaluation. Patient's husband present and supportive during session, providing appropriate encouragement and cues to patient. Patient lethargic throughout session requiring max multimodal cues to keep eyes open and attend during functional tasks, especially sitting balance edge of bed with max cuing for RUE placement far to R to maintain sitting balance with brief periods of supervision (up to 30-60 sec) before patient closing eyes or moving RUE resulting in LOB anterior, to L, and posteriorly with total A to prevent fall and no noted balance reactions. Patient also noted to be impulsive with mobility, had difficulty following basic functional commands, and was unintelligible when attempting to answer basic questions or express needs due to aphasia. Patient attended to L environment with max multimodal cues. In standing with max A x 2, patient actively flexing RLE requiring total A x 2 to prevent BLE buckling and unsafe behavior limiting standing due to high fall risk. Patient left sitting in TIS wheelchair with all needs in reach and family present to await SLP evaluation. Positioning handout for hemiplegia posted above bed and husband instructed in and provided handout for PROM LLE.    PT Evaluation Precautions/Restrictions Precautions Precautions: Fall Precaution Comments: left hemi, L inattention, impulsive Restrictions Weight Bearing Restrictions: No General Chart Reviewed: Yes Family/Caregiver Present: Yes  Pain Pain Assessment Pain Assessment: 0-10 Faces Pain Scale: Hurts a little bit Home Living/Prior  Functioning Home Living Available Help at Discharge: Family (husband taking FMLA) Type of Home: House Home Access: Stairs to enter Technical brewer of Steps: 1 Entrance Stairs-Rails: None Home Layout: One level Bathroom Shower/Tub: Product/process development scientist: Standard Bathroom Accessibility: Yes  Lives With: Spouse;Family (2 children - ages 12 and 57) Prior Function Level of Independence: Independent with basic ADLs;Independent with homemaking with ambulation;Independent with homemaking with wheelchair;Independent with gait;Independent with transfers  Able to Take Stairs?: Yes Driving: Yes Vocation: Full time employment 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) Vision/Perception   Defer to OT evaluation  Cognition Overall Cognitive Status: Impaired/Different from baseline Arousal/Alertness: Lethargic Attention: Sustained Focused Attention: Appears intact Sustained Attention: Impaired Sustained Attention Impairment: Verbal basic;Functional basic Memory: Impaired Memory Impairment: Decreased recall of new information Awareness: Impaired Awareness Impairment: Intellectual impairment Problem Solving: Impaired Problem Solving Impairment: Functional basic Executive Function:  (all impaired due to lower level deficits ) Behaviors: Restless;Verbal agitation;Poor frustration tolerance Safety/Judgment: Impaired Sensation Sensation Light Touch: Impaired by gross assessment Proprioception: Impaired by gross assessment Additional Comments: UTA due to cognition and expressive aphasia, anticipate impaired LUE/LLE  Coordination Gross Motor Movements are Fluid and Coordinated: No Fine Motor Movements are Fluid and Coordinated: No Coordination and Movement Description: LUE/LLE hemiplegia Motor  Motor Motor: Abnormal postural alignment and control;Hemiplegia  Mobility Bed Mobility Bed Mobility: Rolling Right;Rolling Left;Supine to Sit;Sit to Supine Rolling Right:  1: +1 Total assist Rolling Left: 1: +1 Total assist Supine to Sit: 1: +1 Total assist Sit to Supine: 1: +2 Total assist Transfers Transfers: Yes Sit to Stand: 1: +2 Total assist Stand to Sit: 1: +2 Total assist Squat Pivot Transfers: 1: +2 Total assist;With upper extremity assistance;With armrests Locomotion  Ambulation Ambulation: No Gait Gait: No Stairs / Additional Locomotion Stairs: No Wheelchair Mobility Wheelchair Mobility: No  Trunk/Postural Assessment  Cervical Assessment Cervical Assessment: Exceptions to [facility] (forward head) Thoracic Assessment Thoracic Assessment: Within Functional Limits Lumbar Assessment Lumbar Assessment: Exceptions to [facility] (posterior pelvic tilt) Postural Control Postural Control: Deficits on evaluation Protective Responses: absent  Balance Balance Balance Assessed: Yes Static Sitting Balance Static Sitting - Balance Support: Right upper extremity supported;Feet supported Static Sitting - Level of Assistance: 2: Max assist;5: Stand by assistance;3: Mod assist;4: Min assist;1: +1 Total assist Static Standing Balance Static Standing - Balance Support: During functional activity;Bilateral upper extremity supported (3 musketeers) Static Standing - Level of Assistance: 1: +2 Total assist Extremity Assessment  RUE Assessment RUE Assessment: Within Functional Limits LUE Assessment LUE Assessment: Exceptions to [facility] LUE Strength LUE Overall Strength: Deficits LUE Overall Strength Comments: 0/5 throughout LUE Tone LUE Tone: Flaccid RLE Assessment RLE Assessment: Within Functional Limits LLE Assessment LLE Assessment: Exceptions to [facility] LLE Strength LLE Overall Strength: Deficits LLE Overall Strength Comments: 0/5 throughout LLE Tone LLE Tone: Flaccid   See Function Navigator for Current Functional Status.   Refer to Care Plan for Long Term Goals  Recommendations for other services: None  Discharge Criteria: Patient will be discharged  from PT if patient refuses treatment 3 consecutive times without medical reason, if treatment goals not met, if there is a change in medical status, if patient makes no progress towards goals or if patient is discharged from hospital.  The above assessment, treatment plan, treatment alternatives and goals were discussed and mutually agreed upon: by family  Laretta Alstrom 04/09/2016, 9:28 PM

## 2016-04-09 NOTE — IPOC Note (Addendum)
Overall Plan of Care [facility]) Patient Details Name: Abigail Campos MRN: [id number] DOB: Aug 14, 1971  Admitting Diagnosis: R  BG hemorrhage  Hospital Problems: Active Problems:   Basal ganglia hemorrhage (HCC)   Aphasia as late effect of cerebrovascular accident   Dysphagia as late effect of cerebrovascular disease   Migraine with aura and without status migrainosus, not intractable   HLD (hyperlipidemia)   Benign essential HTN   Gait disturbance, post-stroke   Hemiplegia, post-stroke (HCC)   Muscle spasticity     Functional Problem List: Nursing Bowel, Edema, Endurance, Medication Management, Motor, Sensory, Safety, Perception, Nutrition, Bladder  PT Balance, Behavior, Edema, Endurance, Motor, Nutrition, Pain, Perception, Safety, Sensory  OT Balance, Behavior, Cognition, Endurance, Motor, Pain, Perception, Safety, Sensory, Vision  SLP Cognition  TR         Basic ADL's: OT Eating, Grooming, Bathing, Dressing, Toileting     Advanced  ADL's: OT  (n/a)     Transfers: PT Bed Mobility, Bed to Chair, Car, Manufacturing systems engineer, Metallurgist: PT Ambulation, Emergency planning/management officer, Stairs     Additional Impairments: OT Fuctional Use of Upper Extremity  SLP Swallowing, Communication, Social Cognition comprehension, expression Social Interaction, Problem Solving, Memory, Attention, Awareness  TR      Anticipated Outcomes Item Anticipated Outcome  Self Feeding set up A  Swallowing  Supervision with least restrictive diet    Basic self-care  min - mod A  Toileting  mod A overall   Bathroom Transfers mod A overall  Bowel/Bladder  Min assist  Transfers  mod A  Locomotion  mod A wheelchair level   Communication  Min A   Cognition  Min A   Pain  < 4  Safety/Judgment  Supervision   Therapy Plan: PT Intensity: Minimum of 1-2 x/day ,45 to 90 minutes PT Frequency: 5 out of 7 days PT Duration Estimated Length of Stay: 21-24 days OT Intensity: Minimum  of 1-2 x/day, 45 to 90 minutes OT Frequency: 5 out of 7 days OT Duration/Estimated Length of Stay: 21-24 days SLP Intensity: Minumum of 1-2 x/day, 30 to 90 minutes SLP Frequency: 3 to 5 out of 7 days SLP Duration/Estimated Length of Stay: 21-24 days        Team Interventions: Nursing Interventions Patient/Family Education, Bowel Management, Disease Management/Prevention, Medication Management, Dysphagia/Aspiration Precaution Training, Cognitive Remediation/Compensation, Discharge Planning  PT interventions Ambulation/gait training, Balance/vestibular training, Cognitive remediation/compensation, Community reintegration, Discharge planning, Disease management/prevention, DME/adaptive equipment instruction, Functional electrical stimulation, Functional mobility training, Neuromuscular re-education, Pain management, Patient/family education, Psychosocial support, Splinting/orthotics, Stair training, Therapeutic Activities, Therapeutic Exercise, Visual/perceptual remediation/compensation, UE/LE Coordination activities, UE/LE Strength taining/ROM, Wheelchair propulsion/positioning  OT Interventions Training and development officer, Cognitive remediation/compensation, Academic librarian, Discharge planning, DME/adaptive equipment instruction, Psychosocial support, UE/LE Strength taining/ROM, Therapeutic Exercise, Patient/family education, Therapeutic Activities, Neuromuscular re-education, Splinting/orthotics, Wheelchair propulsion/positioning, Visual/perceptual remediation/compensation, Functional mobility training, Functional electrical stimulation, Self Care/advanced ADL retraining, UE/LE Coordination activities  SLP Interventions Cognitive remediation/compensation, Cueing hierarchy, Dysphagia/aspiration precaution training, Functional tasks, Patient/family education, Therapeutic Activities, Speech/Language facilitation, Environmental controls, Internal/external aids  TR Interventions    SW/CM  Interventions Discharge Planning, Psychosocial Support, Patient/Family Education    Team Discharge Planning: Destination: PT-Home ,OT- Home , SLP-Home Projected Follow-up: PT-Home health PT, 24 hour supervision/assistance, OT-  Home health OT, 24 hour supervision/assistance, SLP-Home Health SLP, Outpatient SLP, 24 hour supervision/assistance Projected Equipment Needs: PT-To be determined, OT- To be determined, SLP-None recommended by SLP Equipment Details: PT- , OT-  Patient/family involved in discharge planning: PT-  Family member/caregiver,  OT-Family member/caregiver, SLP-Family member/caregiver, Patient  MD ELOS: 20-24 days. Medical Rehab Prognosis:  Good Assessment: 45 y.o. right handed female with history of hypertension and migraines. History taken from chart review. Lives with husband and has 2 children ages 60 and 24. Family members in the area to assist as needed. 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 Presented 04/01/2016 left facial droop with right gaze preference and headache. Systolic blood pressure greater than 200. Cranial CT scan showed focal area of hemorrhage arising in the posterior, lateral right basal ganglia measuring 4.5 x 3.4 x 3.4 cm with mild surrounding edema. Negative arterial findings on CTA of head and neck. Echocardiogram ejection fraction [id number] grade 2 diastolic dysfunction. Bouts of hypokalemia supplement added. Neurology consulted with conservative care. Follow-up CT of the head 04/04/2016 stable with slightly increased localized vasogenic edema. 4 mm right to left midline shift not significantly changed. Decreasing small volume acute subarachnoid hemorrhage. Dysphagia 2 thin liquid diet . Close monitoring of blood pressure. Patient with deficits including left-sided hemiplegia, poststroke gait disorder, aphasia, dysphagia, as such we will focus on safety awareness, improving left-sided weakness, and advance diet as tolerated.. Goals are for patient to be min/mod A with PT and  min/mod A with OT and min a with SLP.   See Team Conference Notes for weekly updates to the plan of care

## 2016-04-10 ENCOUNTER — Inpatient Hospital Stay (HOSPITAL_COMMUNITY): Payer: Managed Care, Other (non HMO) | Admitting: Physical Therapy

## 2016-04-10 ENCOUNTER — Inpatient Hospital Stay (HOSPITAL_COMMUNITY): Payer: Managed Care, Other (non HMO) | Admitting: Speech Pathology

## 2016-04-10 ENCOUNTER — Inpatient Hospital Stay (HOSPITAL_COMMUNITY): Payer: Managed Care, Other (non HMO) | Admitting: Occupational Therapy

## 2016-04-10 MED ORDER — LISINOPRIL 40 MG PO TABS
40.0000 mg | ORAL_TABLET | Freq: Two times a day (BID) | ORAL | Status: DC
  Administered 2016-04-10 – 2016-04-22 (×22): 40 mg via ORAL
  Filled 2016-04-10 (×12): qty 1
  Filled 2016-04-10: qty 2
  Filled 2016-04-10 (×12): qty 1

## 2016-04-10 NOTE — Progress Notes (Signed)
Physical Therapy Session Note  Patient Details  Name: Abigail Campos MRN: [id number] Date of Birth: 1971/08/18  Today's Date: 04/10/2016 PT Missed Time: 45 Minutes Missed Time Reason: Patient fatigue  Attempted to see pt for scheduled therapy session but pt sleeping soundly and unable to be aroused except for strong sternal rub, and then unable to stay awake.  Will attempt to make up session later this afternoon as schedule allows.     See Function Navigator for Current Functional Status.    Solei Wubben E Penven-Crew 04/10/2016, 12:20 PM

## 2016-04-10 NOTE — Progress Notes (Signed)
Physical Therapy Session Note  Patient Details  Name: Abigail Campos MRN: [id number] Date of Birth: 1970-12-15  Today's Date: 04/10/2016 PT Individual Time: 1415-1530 PT Individual Time Calculation (min): 75 min   Short Term Goals: Week 1:  PT Short Term Goal 1 (Week 1): Patient will maintain sitting balance with supervision x 5 minutes.  PT Short Term Goal 2 (Week 1): Patient will follow basic one step commands 75% of available opportunities.  PT Short Term Goal 3 (Week 1): Patient will tolerate standing with use of equipment x 3 min.  PT Short Term Goal 4 (Week 1): Patient will initiate ambulation.  Skilled Therapeutic Interventions/Progress Updates:    Pt received sleeping in bed, difficult to arouse, but agreeable to therapy.  Supine>sit with max assist for LLE and trunk control with HOB elevated.  Squat/pivot to w/c with +2 assist and pt demonstrating no attempt to assist.  Once in w/c, pt more alert.  Squat/pivot from w/c<>therapy mat with +2 assist but pt able to reach RUE out for therapy mat.  Static sitting balance on edge of therapy mat with mirror placed for visual feedback.  Pt required close supervision>min assist for static sitting while demonstrating sustained attention to task in mildly distracting environment, but requiring mod/max assist for maintaining static sitting in same environment when attempting dual task of maintaining sitting balance while having conversation with therapist.  Pt incontinent of urine and transported back to room in w/c.  Squat/pivot back to bed and sit>supine with +2 assist.  Pt tearful over incontinence and therapist provided emotional support and pt education on effects of stroke on continence and ability to communicate needs effectively and efficiently.  Pt able to bridge for total assist removal of soiled pants with therapist stabilizing LLE in flexed position.  Rolling R and L with min assist (therapist places LLE in flexed position for rolling to  R) for doffing soiled brief, peri-care, and donning clean brief.  PT threaded BLE through clean pants and pulled up L side and pt able to bridge to pull up R side.  Pt positioned in chair position in bed and PT facilitated attention to L side by holding pudding cup for pt to eat from.  Pt left supine in bed with family present, call bell in reach and needs met.   Therapy Documentation Precautions:  Precautions Precautions: Fall Precaution Comments: left hemi, L inattention, impulsive Restrictions Weight Bearing Restrictions: No   See Function Navigator for Current Functional Status.   Therapy/Group: Individual Therapy  [name] 04/10/2016, 5:01 PM

## 2016-04-10 NOTE — Progress Notes (Signed)
Occupational Therapy Session Note  Patient Details  Name: Abigail Campos MRN: [id number] Date of Birth: [birth year]  Today's Date: 04/10/2016 OT Individual Time: [id number] Individual Time Calculation (min): 75 min    Short Term Goals: Week 1:  OT Short Term Goal 1 (Week 1): Pt will participate in 5 minutes of self care tasks with 2 breaks or less in order to increase alertness and endurance for functional tasks. OT Short Term Goal 2 (Week 1): Pt will visually track to the L to locate 3 items during OT intervention with min verbal cues. OT Short Term Goal 3 (Week 1): Pt will perform transfer with one person to toilet in order to decrease level of assist needed with functional transfers.   Skilled Therapeutic Interventions/Progress Updates:    Skilled OT addressed bed mobility, sitting balance, visual attention to left, visual scanning to left.  Pt asleep upon OT arrival.    Pt's husband, [name] in room.  Arroused pt and began rolling for bathing purposes.  Pt was max assist for rolling with physical assist to utilize LUE to hold and assist.  Pt went from supine to sit with +2.  Sat EOB with max assist during remainder for bathing.  Pt assisted with doffing shirt (max assist).   Engaged in oculomoter exercises in all the fields.  Pt gazed to left and sustained for 5 seconds.  Positioned pt at top of bed with all needs in reach.  Husband in room.    Therapy Documentation Precautions:  Precautions Precautions: Fall Precaution Comments: left hemi, L inattention, impulsive Restrictions Weight Bearing Restrictions: No Vital Signs: Therapy Vitals Temp: 98 F (36.7 C) Temp Source: Oral Pulse Rate: 60 Resp: 16 BP: (!) 140/9 mmHg Patient Position (if appropriate): Lying Oxygen Therapy SpO2: 100 % O2 Device: Not Delivered Pain:  4/10            See Function Navigator for Current Functional Status.   Therapy/Group: Individual Therapy  Lisa Roca 04/10/2016, 5:15  PM

## 2016-04-10 NOTE — Progress Notes (Signed)
Speech Language Pathology Daily Session Note  Patient Details  Name: Abigail Campos MRN: [id number] Date of Birth: 07-24-1971  Today's Date: 04/10/2016 SLP Individual Time: 1000-1055 SLP Individual Time Calculation (min): 55 min  Short Term Goals: Week 1: SLP Short Term Goal 1 (Week 1): Patient will consume current diet with minimal overt s/s of aspiration with Mod A verbal cues for use of swallowing strategies.  SLP Short Term Goal 2 (Week 1): Patient will demonstrate sustained attention to functional tasks for 5 minutes with Min A verbal cues for redirection.  SLP Short Term Goal 3 (Week 1): Patient will attend to left field of enviornment during functional tasks with Mod A verbal ces.  SLP Short Term Goal 4 (Week 1): Patient will answer basic yes/no questions with 50% accuracy in regards to wants/needs with Mod A multimodal cues.  SLP Short Term Goal 5 (Week 1): Patient will utilize speech intelligibility strategies at the word level with Max A verbal cues to achieve 50% intelligibility.,  SLP Short Term Goal 6 (Week 1): Patient will name common objects with 50% accuracy with Max A multimodal cues.   Skilled Therapeutic Interventions: Skilled treatment session focused on cognitive-linguistic and dysphagia goals. SLP facilitated session by providing Max A semantic, verbal, visual and phonemic cues for patient to self-monitor and correct errors during confrontational naming tasks due to perseveration and phonemic paraphasias. Patient was able to decode at the word level with 100% accuracy but required Max-Total A to decode at the phrase level, suspect visual-preceptutal deficits impacted function. Patient consumed medications whole with puree without overt s/s of aspiration with extra time needed for AP transit and consumed thin liquids via straw without overt s/s of aspiration but needed Min A verbal cues for use of small sips. Patient initially required overall Min A verbal cues to attend to  tasks, however, cues increased to overall Max A multimodal cues due to pain and fatigue by end of session. Patient left supine in bed with family present. Continue with current plan of care.    Function:  Eating Eating   Modified Consistency Diet: Yes Eating Assist Level: Helper feeds patient (RN with medications )           Cognition Comprehension Comprehension assist level: Understands basic less than 25% of the time/ requires cueing >75% of the time  Expression   Expression assist level: Expresses basis less than 25% of the time/requires cueing >75% of the time.  Social Interaction Social Interaction assist level: Interacts appropriately 25 - 49% of time - Needs frequent redirection.  Problem Solving Problem solving assist level: Solves basic less than 25% of the time - needs direction nearly all the time or does not effectively solve problems and may need a restraint for safety  Memory Memory assist level: Recognizes or recalls less than 25% of the time/requires cueing greater than 75% of the time    Pain Headache, unable to rate. RN administered medications   Therapy/Group: Individual Therapy  Abigail Campos 04/10/2016, 4:45 PM

## 2016-04-10 NOTE — Progress Notes (Signed)
PHYSICAL MEDICINE & REHABILITATION     PROGRESS NOTE  Subjective/Complaints:  Patient seen sleeping in bed this morning. Family was at bedside states that patient had a headache yesterday in the morning, but that improved with medication and she did well in therapies after that.  ROS: Unable to obtain due to mental condition.  Objective: Vital Signs: Blood pressure 166/96, pulse 64, temperature 98.6 F (37 C), temperature source Oral, resp. rate 14, SpO2 100 %. No results found.  Recent Labs  04/09/16 0530 04/09/16 0700  WBC 3.2* 8.9  HGB 4.5* 12.5  HCT 14.4* 39.2  PLT QUESTIONABLE RESULTS, RECOMMEND RECOLLECT TO VERIFY 228    Recent Labs  04/08/16 0445  NA 139  K 3.4*  CL 103  GLUCOSE 121*  BUN 11  CREATININE 0.94  CALCIUM 9.0   CBG (last 3)  No results for input(s): GLUCAP in the last 72 hours.  Wt Readings from Last 3 Encounters:  04/06/16 73.6 kg (162 lb 4.1 oz)  01/22/16 74.9 kg (165 lb 2 oz)  12/06/15 73.483 kg (162 lb)    Physical Exam:  BP 166/96 mmHg  Pulse 64  Temp(Src) 98.6 F (37 C) (Oral)  Resp 14  SpO2 100% Constitutional: She appears well-developed. Obese. NAD. Vital signs reviewed. HENT:  Normocephalic and atraumatic.   Eyes: Conjunctivae and EOM are normal.     Cardiovascular: Normal rate and regular rhythm.    Respiratory: Effort normal and breath sounds normal. No respiratory distress.   GI: Soft. Bowel sounds are normal. She exhibits no distension.  Musculoskeletal: She exhibits edema. She exhibits no tenderness.  Neurological: She is alert.  Appears to have a right gaze preference with left neglect. Inconsistently follows commands Expressive greater than receptive aphasia Dysarthria Left facial weakness Motor:? Reliability, moving right upper and right lower extremity freely. Not moving left side.  MAS 1/4 Elbow flexion In left upper extremity Skin: Skin is warm and dry.  Psychiatric:  Unable to assess     Assessment/Plan: 1. Functional deficits secondary to right basal ganglia hemorrhage which require 3+ hours per day of interdisciplinary therapy in a comprehensive inpatient rehab setting. Physiatrist is providing close team supervision and 24 hour management of active medical problems listed below. Physiatrist and rehab team continue to assess barriers to discharge/monitor patient progress toward functional and medical goals.  Function:  Bathing Bathing position   Position: Other (comment) (sitting EOB for UB and supine for LB)  Bathing parts Body parts bathed by patient: Chest, Abdomen Body parts bathed by helper: Right arm, Left arm, Front perineal area, Buttocks, Left upper leg, Right upper leg, Right lower leg, Left lower leg, Back  Bathing assist Assist Level:  (total A)      Upper Body Dressing/Undressing Upper body dressing   What is the patient wearing?: Hospital gown                Upper body assist Assist Level:  (total A)      Lower Body Dressing/Undressing Lower body dressing   What is the patient wearing?: Hospital Gown, [place], Non-skid slipper socks           Non-skid slipper socks- Performed by helper: Don/doff right sock, Don/doff left sock               TED Hose - Performed by helper: Don/doff right TED hose, Don/doff left TED hose  Lower body assist Assist for lower body dressing:  (total A)  Toileting Toileting Toileting activity did not occur: No continent bowel/bladder event   Toileting steps completed by helper: Adjust clothing prior to toileting, Performs perineal hygiene, Adjust clothing after toileting    Toileting assist     Transfers Chair/bed transfer   Chair/bed transfer method: Squat pivot Chair/bed transfer assist level: 2 helpers Chair/bed transfer assistive device: Armrests     Locomotion Ambulation Ambulation activity did not occur: Safety/medical concerns         Wheelchair           Cognition Comprehension Comprehension assist level: Understands basic less than 25% of the time/ requires cueing >75% of the time  Expression Expression assist level: Expresses basis less than 25% of the time/requires cueing >75% of the time.  Social Interaction Social Interaction assist level: Interacts appropriately 25 - 49% of time - Needs frequent redirection.  Problem Solving Problem solving assist level: Solves basic less than 25% of the time - needs direction nearly all the time or does not effectively solve problems and may need a restraint for safety  Memory Memory assist level: Recognizes or recalls less than 25% of the time/requires cueing greater than 75% of the time     Medical Problem List and Plan: 1.Left hemiplegia, aphasia, dysphagia secondary to right basal ganglia hemorrhage secondary to hypertensive crisis on 04/01/16  Continue CIR  Will order PRAFO for left lower extremity and splints for left upper extremity   2. DVT Prophylaxis/Anticoagulation: Subcutaneous heparin for DVT prophylaxis initiated 04/07/2016 3. Pain Management/migraine headaches: Fioricet for headaches and Robaxin. 4. Dysphagia. #2 thin liquid diet. Speech therapy follow-up. Will advance diet as tolerated 5. Neuropsych: This patient is not capable of making decisions on her own behalf. 6. Skin/Wound Care: Routine skin checks 7. Fluids/Electrolytes/Nutrition: Routine I&O   BMP within normal range of 4/27 8. Hypertension. Plendil 10 mg daily, Bysystolic 20 mg every 12 hours, Hydralazine 10 mg 3 times a day. Monitor with increased mobility  lisinopril 20 mg twice a day, increased to 40 mg on 4/28 9. Hyperlipidemia. Lipitor 10. Spasticity  Continue baclofen 5 mg on 4/27  LOS (Days) 2 A FACE TO FACE EVALUATION WAS PERFORMED  Ankit [name] 04/10/2016 8:41 AM

## 2016-04-10 NOTE — Progress Notes (Signed)
Orthopedic Tech Progress Note Patient Details:  Abigail Campos 03/20/1971 [id number]  Patient ID: [name], female   DOB: [birth year], 45 y.o.   MRN: [id number]   [name] 04/10/2016, 8:59 AMCalled [employer] for left Prafo brace and resting hand splint.

## 2016-04-11 ENCOUNTER — Inpatient Hospital Stay (HOSPITAL_COMMUNITY): Payer: Managed Care, Other (non HMO) | Admitting: Physical Therapy

## 2016-04-11 ENCOUNTER — Inpatient Hospital Stay (HOSPITAL_COMMUNITY): Payer: Managed Care, Other (non HMO) | Admitting: Speech Pathology

## 2016-04-11 ENCOUNTER — Inpatient Hospital Stay (HOSPITAL_COMMUNITY): Payer: Managed Care, Other (non HMO) | Admitting: Occupational Therapy

## 2016-04-11 MED ORDER — FLUCONAZOLE 100 MG PO TABS
100.0000 mg | ORAL_TABLET | Freq: Every day | ORAL | Status: DC
  Administered 2016-04-11 – 2016-04-15 (×5): 100 mg via ORAL
  Filled 2016-04-11 (×5): qty 1

## 2016-04-11 MED ORDER — QUETIAPINE FUMARATE 25 MG PO TABS
25.0000 mg | ORAL_TABLET | Freq: Every day | ORAL | Status: DC
  Administered 2016-04-11 – 2016-04-21 (×10): 25 mg via ORAL
  Filled 2016-04-11 (×11): qty 1

## 2016-04-11 NOTE — Progress Notes (Signed)
Occupational Therapy Session Note  Patient Details  Name: Abigail Campos MRN: [id number] Date of Birth: 1971/04/06  Today's Date: 04/11/2016 OT Individual Time:  - 0745-0900  (75 min)      Short Term Goals: Week 1:  OT Short Term Goal 1 (Week 1): Pt will participate in 5 minutes of self care tasks with 2 breaks or less in order to increase alertness and endurance for functional tasks. OT Short Term Goal 2 (Week 1): Pt will visually track to the L to locate 3 items during OT intervention with min verbal cues. OT Short Term Goal 3 (Week 1): Pt will perform transfer with one person to toilet in order to decrease level of assist needed with functional transfers.   Skilled Therapeutic Interventions/Progress Updates:    Skilled OT addressed bed mobility, sitting balance, visual attention and scanning to left, visual scanning to left. Pt lying in bed and easily aroused.   Pt sister,  present.  Pt went from supine to sit with max assist.  Pt sat on EOB with mod assit for balance for 5 minutes..  Transferred from bed to 3n1 dropped arm with +2.    Ppt sat on 3n1 with max assist for balance.   Pt needed cues for maintaining attention and balance.  Transferred back to bed.  Completed bathing and dressing EOB and supine for LB.    Pt able to sustain attention to left for 10 seconds.  Pt verbalized better with word selection.  Engaged in oculomoter exercises in all the fields. Pt gazed to left and sustained for 5 seconds.     Therapy Documentation Precautions:  Precautions Precautions: Fall Precaution Comments: left hemi, L inattention, impulsive Restrictions Weight Bearing Restrictions: No      Pain:  none      See Function Navigator for Current Functional Status.   Therapy/Group: Individual Therapy  Lisa Roca 04/11/2016, 7:46 AM

## 2016-04-11 NOTE — Progress Notes (Signed)
Speech Language Pathology Daily Session Note  Patient Details  Name: Abigail Campos MRN: [id number] Date of Birth: [birth year]  Today's Date: 04/11/2016 SLP Individual Time: 1300-1330 SLP Individual Time Calculation (min): 30 min  Short Term Goals: Week 1: SLP Short Term Goal 1 (Week 1): Patient will consume current diet with minimal overt s/s of aspiration with Mod A verbal cues for use of swallowing strategies.  SLP Short Term Goal 2 (Week 1): Patient will demonstrate sustained attention to functional tasks for 5 minutes with Min A verbal cues for redirection.  SLP Short Term Goal 3 (Week 1): Patient will attend to left field of enviornment during functional tasks with Mod A verbal ces.  SLP Short Term Goal 4 (Week 1): Patient will answer basic yes/no questions with 50% accuracy in regards to wants/needs with Mod A multimodal cues.  SLP Short Term Goal 5 (Week 1): Patient will utilize speech intelligibility strategies at the word level with Max A verbal cues to achieve 50% intelligibility.,  SLP Short Term Goal 6 (Week 1): Patient will name common objects with 50% accuracy with Max A multimodal cues.   Skilled Therapeutic Interventions: Skilled ST session focused on dysphagia and cognitive goals. SLP facilitated session by providing Mod A verbal cues for patient to effectively manipulate trials of dysphagia 3. Pt required Mod A verbal cues to clear left buccal cavity as well as manage left labial spillage. Pt with timely swallow initiation with thin liquids and independently consumed single sips via straw without s/s of aspiration/dysphagia. Patient required Mod A verbal cues to attend to food/liquids items with impulsivity noted and off-topic comments requiring mod A verbal redirection. Pt required Min A verbal cues to attend to items on left. Patient left supine in bed with family present. Continue current plan of care.   Function:  Eating Eating   Modified Consistency Diet: Yes Eating  Assist Level: More than reasonable amount of time;Set up assist for;Supervision or verbal cues;Helper checks for pocketed food;Helper brings food to mouth;Help with picking up utensils   Eating Set Up Assist For: Opening containers;Cutting food Helper [place] on Utensil: Occasionally Helper Brings Food to Mouth: Occasionally   Cognition Comprehension Comprehension assist level: Understands basic 25 - 49% of the time/ requires cueing 50 - 75% of the time  Expression   Expression assist level: Expresses basic 25 - 49% of the time/requires cueing 50 - 75% of the time. Uses single words/gestures.  Social Interaction Social Interaction assist level: Interacts appropriately 25 - 49% of time - Needs frequent redirection.  Problem Solving Problem solving assist level: Solves basic less than 25% of the time - needs direction nearly all the time or does not effectively solve problems and may need a restraint for safety  Memory Memory assist level: Recognizes or recalls 25 - 49% of the time/requires cueing 50 - 75% of the time    Pain Pain Assessment Pain Assessment: Faces Faces Pain Scale: Hurts even more Pain Type: Acute pain Pain Location: Head Pain Descriptors / Indicators: Headache Pain Onset: On-going Pain Intervention(s): Medication (See eMAR)  Therapy/Group: Individual Therapy  Nadalie Laughner 04/11/2016, 1:40 PM

## 2016-04-11 NOTE — Progress Notes (Signed)
[facility]IC[facility] RE[facility]ABILITATION     PROGRESS NOTE  Subjective/Complaints:  Family at bedside, states that patient sleeps during the day in his up much during the night. Also noted to have some plaque on the tongue  ROS: Unable to obtain due to mental condition.  Objective: Vital Signs: Blood pressure 150/91, pulse 53, temperature 98.4 F (36.9 C), temperature source Oral, resp. rate 18, SpO2 98 %. No results found.  Recent Labs  04/09/16 0530 04/09/16 0700  WBC 3.2* 8.9  HGB 4.5* 12.5  HCT 14.4* 39.2  PLT QUESTIONABLE RESULTS, RECOMMEND RECOLLECT TO VERIFY 228   No results for input(s): NA, K, CL, GLUCOSE, BUN, CREATININE, CALCIUM in the last 72 hours.  Invalid input(s): CO CBG (last 3)  No results for input(s): GLUCAP in the last 72 hours.  Wt Readings from Last 3 Encounters:  04/06/16 73.6 kg (162 lb 4.1 oz)  01/22/16 74.9 kg (165 lb 2 oz)  12/06/15 73.483 kg (162 lb)    Physical Exam:  BP 150/91 mmHg  Pulse 53  Temp(Src) 98.4 F (36.9 C) (Oral)  Resp 18  SpO2 98% Constitutional: She appears well-developed. Obese. NAD. Vital signs reviewed. HENT:  Normocephalic and atraumatic.  White plaque on tongueEyes: Conjunctivae and EOM are normal.     Cardiovascular: Normal rate and regular rhythm.    Respiratory: Effort normal and breath sounds normal. No respiratory distress.   GI: Soft. Bowel sounds are normal. She exhibits no distension.  Musculoskeletal: She exhibits edema. She exhibits no tenderness.  Neurological: She is alert.  Appears to have a right gaze preference with left neglect. Inconsistently follows commands Expressive greater than receptive aphasia Dysarthria Left facial weakness Motor:? Reliability, moving right upper and right lower extremity freely. Not moving left side.  MAS 1/4 Elbow flexion In left upper extremity Skin: Skin is warm and dry.  Psychiatric:  Unable to assess    Assessment/Plan: 1. Functional deficits secondary  to right basal ganglia hemorrhage which require 3+ hours per day of interdisciplinary therapy in a comprehensive inpatient rehab setting. Physiatrist is providing close team supervision and 24 hour management of active medical problems listed below. Physiatrist and rehab team continue to assess barriers to discharge/monitor patient progress toward functional and medical goals.  Function:  Bathing Bathing position   Position: Bed  Bathing parts Body parts bathed by patient: Chest, Abdomen Body parts bathed by helper: Front perineal area, Buttocks, Right upper leg, Left upper leg, Right lower leg, Left lower leg, Back, Right arm, Left arm  Bathing assist Assist Level: Touching or steadying assistance(Pt > 75%)      Upper Body Dressing/Undressing Upper body dressing   What is the patient wearing?: Pull over shirt/dress       Pull over shirt/dress - Perfomed by helper: Thread/unthread right sleeve, Thread/unthread left sleeve, Put head through opening, Pull shirt over trunk        Upper body assist Assist Level: 2 helpers      Lower Body Dressing/Undressing Lower body dressing   What is the patient wearing?: Pants, Non-skid slipper socks       Pants- Performed by helper: Thread/unthread right pants leg, Thread/unthread left pants leg, Pull pants up/down   Non-skid slipper socks- Performed by helper: Don/doff right sock, Don/doff left sock               TED Hose - Performed by helper: Don/doff right TED hose, Don/doff left TED hose  Lower body assist Assist for lower body  dressing: 2 Helpers      Naval architect activity did not occur: No continent bowel/bladder event   Toileting steps completed by helper: Adjust clothing prior to toileting, Performs perineal hygiene, Adjust clothing after toileting    Toileting assist Assist level: Two helpers   Transfers Chair/bed transfer   Chair/bed transfer method: Squat pivot Chair/bed transfer assist level: 2  helpers Chair/bed transfer assistive device: Bedrails     Locomotion Ambulation Ambulation activity did not occur: Safety/medical concerns         Wheelchair       Assist Level: Dependent (Pt equals 0%)  Cognition Comprehension Comprehension assist level: Understands basic 25 - 49% of the time/ requires cueing 50 - 75% of the time  Expression Expression assist level: Expresses basic 25 - 49% of the time/requires cueing 50 - 75% of the time. Uses single words/gestures.  Social Interaction Social Interaction assist level: Interacts appropriately 25 - 49% of time - Needs frequent redirection.  Problem Solving Problem solving assist level: Solves basic 25 - 49% of the time - needs direction more than half the time to initiate, plan or complete simple activities  Memory Memory assist level: Recognizes or recalls 25 - 49% of the time/requires cueing 50 - 75% of the time     Medical Problem List and Plan: 1.Left hemiplegia, aphasia, dysphagia secondary to right basal ganglia hemorrhage secondary to hypertensive crisis on 04/01/16  Continue CIR  Will order PRAFO for left lower extremity and splints for left upper extremity   2. DVT Prophylaxis/Anticoagulation: Subcutaneous heparin for DVT prophylaxis initiated 04/07/2016 3. Pain Management/migraine headaches: Fioricet for headaches and Robaxin. 4. Dysphagia. #2 thin liquid diet. Speech therapy follow-up. Will advance diet as tolerated 5. Neuropsych: This patient is not capable of making decisions on her own behalf. 6. Skin/Wound Care: Routine skin checks 7. Fluids/Electrolytes/Nutrition: Routine I&O    BMP Latest Ref Rng 04/08/2016 04/07/2016 04/06/2016  Glucose 65 - 99 mg/dL 121(H) 120(H) 117(H)  BUN 6 - 20 mg/dL 11 10 9   Creatinine 0.44 - 1.00 mg/dL 0.94 0.84 0.75  Sodium 135 - 145 mmol/L 139 137 140  Potassium 3.5 - 5.1 mmol/L 3.4(L) 3.3(L) 3.4(L)  Chloride 101 - 111 mmol/L 103 101 105  CO2 22 - 32 mmol/L 28 26 27   Calcium 8.9 -  10.3 mg/dL 9.0 8.9 8.7(L)    8. Hypertension. Plendil 10 mg daily, Bysystolic 20 mg every 12 hours, Hydralazine 10 mg 3 times a day. Monitor with increased mobility  lisinopril 20 mg twice a day, increased to 40 mg on 4/28 9. Hyperlipidemia. Lipitor 10. Spasticity: Well controlled  Continue baclofen 5 mg on 4/27 11. Thrush, Start Diflucan LOS (Days) 3 A FACE TO FACE EVALUATION WAS PERFORMED  Charlett Blake 04/11/2016 10:36 AM

## 2016-04-11 NOTE — Progress Notes (Signed)
Physical Therapy Session Note  Patient Details  Name: Abigail Campos MRN: [id number] Date of Birth: 07-20-1971  Today's Date: 04/11/2016 PT Individual Time: 1100-1201 PT Individual Time Calculation (min): 61 min   Short Term Goals: Week 1:  PT Short Term Goal 1 (Week 1): Patient will maintain sitting balance with supervision x 5 minutes.  PT Short Term Goal 2 (Week 1): Patient will follow basic one step commands 75% of available opportunities.  PT Short Term Goal 3 (Week 1): Patient will tolerate standing with use of equipment x 3 min.  PT Short Term Goal 4 (Week 1): Patient will initiate ambulation.  Skilled Therapeutic Interventions/Progress Updates:    Pt received sleeping in bed, difficult to arouse in supine, but agreeable to therapy.  Mod assist for supine>sit with pt pulling self to EOB using therapist's hand and bed rail with RUE.  Max assist squat/pivot from EOB>w/c>therapy mat to pt's R with pt counting "1-2-3".  Mod assist and max multimodal cues for pt to scoot forwards/backwards with transfers.  Pt able to tolerate sitting edge of mat x5 minutes with steady assist>max assist for maintaining upright posture and midline.  Pt requesting rest break and positioned in supine with max assist on therapy mat.  Pt engaged in LLE NMR for straight plane hip/knee flexion and extension with pt demonstrating active hip/knee extension in 3 attempt out of 5.  Pt returned to sitting with mod assist and states that episode of urine incontinence.  Stand/pivot back to w/c and w/c>bed with mod assist.  Sit>supine max assist for LLE and trunk and pt able to scoot to [facility] with mod assist.  Rolling L and R for clothing management and changing brief.  Pt able to perform hygiene with supervision and verbal cues for thoroughness.  LB dressing at bed level with PT threading LLE and pulling up L side of pants.  Pt positioned to comfort in bed with call bell in reach and needs met.  Pt's husband present at bedside.     Therapy Documentation Precautions:  Precautions Precautions: Fall Precaution Comments: left hemi, L inattention, impulsive Restrictions Weight Bearing Restrictions: No   See Function Navigator for Current Functional Status.   Therapy/Group: Individual Therapy  Abigail Campos 04/11/2016, 12:22 PM

## 2016-04-11 NOTE — Progress Notes (Signed)
Physical Therapy Session Note  Patient Details  Name: Abigail Campos MRN: [id number] Date of Birth: 1971-05-17  Today's Date: 04/11/2016 PT Individual Time: 1330-1405 PT Individual Time Calculation (min): 35 min   Short Term Goals: Week 1:  PT Short Term Goal 1 (Week 1): Patient will maintain sitting balance with supervision x 5 minutes.  PT Short Term Goal 2 (Week 1): Patient will follow basic one step commands 75% of available opportunities.  PT Short Term Goal 3 (Week 1): Patient will tolerate standing with use of equipment x 3 min.  PT Short Term Goal 4 (Week 1): Patient will initiate ambulation.  Skilled Therapeutic Interventions/Progress Updates:    Pt received resting in bed, family present for visit, no c/o pain and agreeable to therapy session.  Pt more alert during this session and able to follow commands 50% of the time with repeated directions.  Supine>sit with mod assist to EOB and pt engaged in simple cognitive task separating cards by color.  Pt with significant difficulty with task and when asked states that she cannot see colors.  Transition to ordering cards by number from a field of 4 with mod multimodal cues.  Pt states that she needs to use bathroom and transitioned back to supine with max assist.  Bed pan placed with rolling to R and bridging.  Pt unsuccessful at urinating.  Bridging x3 to remove bedpan with therapist supporting LLE in hook lying position.  Pt able to bridge x3 to pull pants up on R side, PT pulled up L side of pants.  Pt scooted to [facility] with mod assist and positioned in chair position.  Provided family education to pt's husband, [name], and daughter Lonn Georgia, for continued simple cognitive challenges balance with "brain breaks".  Both verbalized understanding.  Pt left supine in bed with call bell in reach and needs met.   Therapy Documentation Precautions:  Precautions Precautions: Fall Precaution Comments: left hemi, L inattention,  impulsive Restrictions Weight Bearing Restrictions: No   See Function Navigator for Current Functional Status.   Therapy/Group: Individual Therapy  [name] 04/11/2016, 2:45 PM

## 2016-04-12 NOTE — Progress Notes (Signed)
[facility] PHYSICAL MEDICINE & REHABILITATION     PROGRESS NOTE  Subjective/Complaints:  Husband at bedside Tongue reportedly feels better. Patient is left handed  ROS: Unable to obtain due to mental condition.  Objective: Vital Signs: Blood pressure 141/85, pulse 55, temperature 98.4 F (36.9 C), temperature source Oral, resp. rate 18, SpO2 100 %. No results found. No results for input(s): WBC, HGB, HCT, PLT in the last 72 hours. No results for input(s): NA, K, CL, GLUCOSE, BUN, CREATININE, CALCIUM in the last 72 hours.  Invalid input(s): CO CBG (last 3)  No results for input(s): GLUCAP in the last 72 hours.  Wt Readings from Last 3 Encounters:  04/06/16 73.6 kg (162 lb 4.1 oz)  01/22/16 74.9 kg (165 lb 2 oz)  12/06/15 73.483 kg (162 lb)    Physical Exam:  BP 141/85 mmHg  Pulse 55  Temp(Src) 98.4 F (36.9 C) (Oral)  Resp 18  SpO2 100% Constitutional: She appears well-developed. Obese. NAD. Vital signs reviewed. HENT:  Normocephalic and atraumatic.  White plaque on tongueEyes: Conjunctivae and EOM are normal.     Cardiovascular: Normal rate and regular rhythm.    Respiratory: Effort normal and breath sounds normal. No respiratory distress.   GI: Soft. Bowel sounds are normal. She exhibits no distension.  Musculoskeletal: She exhibits edema. She exhibits no tenderness.  Neurological: She is alert.  Appears to have a right gaze preference with left neglect. Inconsistently follows commands Expressive greater than receptive aphasia Dysarthria Left facial weakness Motor:? Reliability, moving right upper and right lower extremity freely. Not moving left side.  MAS 1/4 Elbow flexion In left upper extremity Skin: Skin is warm and dry.  Psychiatric:  Unable to assess    Assessment/Plan: 1. Functional deficits secondary to right basal ganglia hemorrhage which require 3+ hours per day of interdisciplinary therapy in a comprehensive inpatient rehab setting. Physiatrist is  providing close team supervision and 24 hour management of active medical problems listed below. Physiatrist and rehab team continue to assess barriers to discharge/monitor patient progress toward functional and medical goals.  Function:  Bathing Bathing position   Position: Bed  Bathing parts Body parts bathed by patient: Chest, Abdomen, Left arm, Front perineal area Body parts bathed by helper: Buttocks, Right upper leg, Left upper leg, Right lower leg, Left lower leg, Back, Right arm  Bathing assist Assist Level: Touching or steadying assistance(Pt > 75%)      Upper Body Dressing/Undressing Upper body dressing   What is the patient wearing?: Pull over shirt/dress     Pull over shirt/dress - Perfomed by patient: Thread/unthread right sleeve, Put head through opening Pull over shirt/dress - Perfomed by helper: Thread/unthread left sleeve, Pull shirt over trunk        Upper body assist Assist Level: 2 helpers      Lower Body Dressing/Undressing Lower body dressing   What is the patient wearing?: Pants     Pants- Performed by patient: Thread/unthread right pants leg, Pull pants up/down Pants- Performed by helper: Thread/unthread left pants leg, Thread/unthread right pants leg, Pull pants up/down   Non-skid slipper socks- Performed by helper: Don/doff right sock, Don/doff left sock               TED Hose - Performed by helper: Don/doff right TED hose, Don/doff left TED hose  Lower body assist Assist for lower body dressing: 2 Helpers      Toileting Toileting Toileting activity did not occur: No continent bowel/bladder event Toileting steps completed  by patient: Performs perineal hygiene Toileting steps completed by helper: Adjust clothing prior to toileting, Performs perineal hygiene, Adjust clothing after toileting Toileting Assistive Devices: Other (comment) (Bed pan )  Toileting assist Assist level: Two helpers   Transfers Chair/bed transfer   Chair/bed  transfer method: Stand pivot Chair/bed transfer assist level: Maximal assist (Pt 25 - 49%/lift and lower) Chair/bed transfer assistive device: Armrests     Locomotion Ambulation Ambulation activity did not occur: Safety/medical concerns         Wheelchair       Assist Level: Dependent (Pt equals 0%)  Cognition Comprehension Comprehension assist level: Understands basic 25 - 49% of the time/ requires cueing 50 - 75% of the time  Expression Expression assist level: Expresses basic 25 - 49% of the time/requires cueing 50 - 75% of the time. Uses single words/gestures.  Social Interaction Social Interaction assist level: Interacts appropriately 25 - 49% of time - Needs frequent redirection.  Problem Solving Problem solving assist level: Solves basic less than 25% of the time - needs direction nearly all the time or does not effectively solve problems and may need a restraint for safety  Memory Memory assist level: Recognizes or recalls 25 - 49% of the time/requires cueing 50 - 75% of the time     Medical Problem List and Plan: 1.Left hemiplegia, aphasia, dysphagia secondary to right basal ganglia hemorrhage secondary to hypertensive crisis on 04/01/16. Patient is aphasic, she is left-handed  CIR level PT, OT, speech therapy  Will order PRAFO for left lower extremity and splints for left upper extremity   2. DVT Prophylaxis/Anticoagulation: Subcutaneous heparin for DVT prophylaxis initiated 04/07/2016 3. Pain Management/migraine headaches: Fioricet for headaches and Robaxin. 4. Dysphagia. #2 thin liquid diet. Speech therapy follow-up. Will advance diet as tolerated 5. Neuropsych: This patient is not capable of making decisions on her own behalf. 6. Skin/Wound Care: Routine skin checks 7. Fluids/Electrolytes/Nutrition: Routine I&O    BMP Latest Ref Rng 04/08/2016 04/07/2016 04/06/2016  Glucose 65 - 99 mg/dL 121(H) 120(H) 117(H)  BUN 6 - 20 mg/dL 11 10 9   Creatinine 0.44 - 1.00 mg/dL 0.94  0.84 0.75  Sodium 135 - 145 mmol/L 139 137 140  Potassium 3.5 - 5.1 mmol/L 3.4(L) 3.3(L) 3.4(L)  Chloride 101 - 111 mmol/L 103 101 105  CO2 22 - 32 mmol/L 28 26 27   Calcium 8.9 - 10.3 mg/dL 9.0 8.9 8.7(L)    8. Hypertension. Plendil 10 mg daily, Bysystolic 20 mg every 12 hours, Hydralazine 10 mg 3 times a day. Monitor with increased mobility  lisinopril 20 mg twice a day, increased to 40 mg on 4/28 9. Hyperlipidemia. Lipitor 10. Spasticity: Well controlled  Continue baclofen 5 mg on 4/27 11. Thrush, Some clearing, continue Diflucan, Continue good oral care LOS (Days) 4 A FACE TO FACE EVALUATION WAS PERFORMED  KIRSTEINS,ANDREW E 04/12/2016 11:01 AM

## 2016-04-13 ENCOUNTER — Inpatient Hospital Stay (HOSPITAL_COMMUNITY): Payer: Managed Care, Other (non HMO) | Admitting: Speech Pathology

## 2016-04-13 ENCOUNTER — Inpatient Hospital Stay (HOSPITAL_COMMUNITY): Payer: Managed Care, Other (non HMO) | Admitting: Occupational Therapy

## 2016-04-13 ENCOUNTER — Inpatient Hospital Stay (HOSPITAL_COMMUNITY): Payer: Managed Care, Other (non HMO) | Admitting: Physical Therapy

## 2016-04-13 MED ORDER — FLUOXETINE HCL 10 MG PO CAPS
10.0000 mg | ORAL_CAPSULE | Freq: Every day | ORAL | Status: DC
  Administered 2016-04-13 – 2016-04-20 (×8): 10 mg via ORAL
  Filled 2016-04-13 (×8): qty 1

## 2016-04-13 NOTE — Progress Notes (Signed)
Occupational Therapy Session Note  Patient Details  Name: Abigail Campos MRN: [id number] Date of Birth: Jan 20, 1971  Today's Date: 04/13/2016 OT Individual Time: 0703-0800 OT Individual Time Calculation (min): 57 min    Short Term Goals: Week 1:  OT Short Term Goal 1 (Week 1): Pt will participate in 5 minutes of self care tasks with 2 breaks or less in order to increase alertness and endurance for functional tasks. OT Short Term Goal 2 (Week 1): Pt will visually track to the L to locate 3 items during OT intervention with min verbal cues. OT Short Term Goal 3 (Week 1): Pt will perform transfer with one person to toilet in order to decrease level of assist needed with functional transfers.   Skilled Therapeutic Interventions/Progress Updates:  Upon entering the room, pt supine in bed sleeping soundly with husband present in room. Skilled OT intervention with focus on midline orientation, visual scanning to the L, sitting balance on EOB, bed mobility, and self care retraining. Pt performed supine <> sit with max A to EOB with assist for hand placement, trunk, and L LE. Pt engaged in UB dressing from seated position on EOB with min - total A for balance. Pt sitting on EOB for total of 7 minutes with max verbal cues to look to L to obtain items needed for bathing. Pt needing hand over hand assist to utilize L UE in functional tasks. Pt returning to supine for LB self care for safety. Pt was able to roll towards L side and pull elastic waist pants over R hip. Pt remained supine in bed with bed alarm activated and call bell within reach.   Therapy Documentation Precautions:  Precautions Precautions: Fall Precaution Comments: left hemi, L inattention, impulsive Restrictions Weight Bearing Restrictions: No   Pain: Pain Assessment Pain Assessment: Faces Pain Score: Asleep Faces Pain Scale: Hurts even more Pain Type: Acute pain Pain Location: Head Pain Orientation: Anterior Pain Onset:  On-going Pain Intervention(s): Medication (See eMAR)  See Function Navigator for Current Functional Status.   Therapy/Group: Individual Therapy  Phineas Semen 04/13/2016, 9:47 AM

## 2016-04-13 NOTE — Care Management Note (Signed)
[facility]patient Rehabilitation Center Individual Statement of Services  Patient Name:  Abigail Campos  Date:  04/13/2016  Welcome to the [facility] [place].  Our goal is to provide you with an individualized program based on your diagnosis and situation, designed to meet your specific needs.  With this comprehensive rehabilitation program, you will be expected to participate in at least 3 hours of rehabilitation therapies Monday-Friday, with modified therapy programming on the weekends.  Your rehabilitation program will include the following services:  Physical Therapy (PT), Occupational Therapy (OT), Speech Therapy (ST), 24 hour per day rehabilitation nursing, Therapeutic Recreaction (TR), Neuropsychology, Case Management (Social Worker), Rehabilitation Medicine, Nutrition Services and Pharmacy Services  Weekly team conferences will be held on Wednesdays to discuss your progress.  Your Social Worker will talk with you frequently to get your input and to update you on team discussions.  Team conferences with you and your family in attendance may also be held.  Expected length of stay: 21-24 days  Overall anticipated outcome: moderate assistance  Depending on your progress and recovery, your program may change. Your Social Worker will coordinate services and will keep you informed of any changes. Your Social Worker's name and contact numbers are listed  below.  The following services may also be recommended but are not provided by the [facility] will be made to provide these services after discharge if needed.  Arrangements include referral to agencies that provide these services.  Your insurance has been verified to be:  [employer] Your primary doctor is:  Dr. Kathlene November  Pertinent information will be shared with your doctor  and your insurance company.  Social Worker:  [place], [place] or ([phone]   Information discussed with and copy given to patient by: Lennart Pall, 04/13/2016, 3:41 PM

## 2016-04-13 NOTE — Progress Notes (Signed)
Speech Language Pathology Daily Session Note  Patient Details  Name: Abigail Campos MRN: [id number] Date of Birth: 1971-06-18  Today's Date: 04/13/2016  Session 1: SLP Individual Time: [id number] SLP Individual Time Calculation (min): 45 min  SLP Missed Time: 15 min (pain, fatigue, refusal)  Session 2: SLP Individual Time: [id number] SLP Individual Time Calculation (min): 35 min  Short Term Goals: Week 1: SLP Short Term Goal 1 (Week 1): Patient will consume current diet with minimal overt s/s of aspiration with Mod A verbal cues for use of swallowing strategies.  SLP Short Term Goal 2 (Week 1): Patient will demonstrate sustained attention to functional tasks for 5 minutes with Min A verbal cues for redirection.  SLP Short Term Goal 3 (Week 1): Patient will attend to left field of enviornment during functional tasks with Mod A verbal ces.  SLP Short Term Goal 4 (Week 1): Patient will answer basic yes/no questions with 50% accuracy in regards to wants/needs with Mod A multimodal cues.  SLP Short Term Goal 5 (Week 1): Patient will utilize speech intelligibility strategies at the word level with Max A verbal cues to achieve 50% intelligibility.,  SLP Short Term Goal 6 (Week 1): Patient will name common objects with 50% accuracy with Max A multimodal cues.   Skilled Therapeutic Interventions:  Session 1: Skilled treatment session focused on dysphagia and speech goals. Upon arrival, patient was awake while sitting upright in bed. SLP facilitated session by providing Max A verbal and visual cues for patient to initiate self-feeding and Mod A verbal cues for patient to utilize a slow rate of self-feeding self-monitor and correct left pocketing and anterior spillage and small bites/sips due to impulsivity. Patient consumed meal without overt s/s of aspiration. Patient required extra time and multiple repetitions to respond to choices from a field of 2 and answer yes/no questions in which patient  demonstrated perseveration.  Patient missed remaining 15 minutes of session due to pain (RN made aware and administered medications) and volitionally reclining the bed and verbalizing "no" as clinician attempted to raise the bed. Patient left supine in bed with husband present. Continue with current plan of care.   Session 2: Skilled treatment session focused on dysphagia and cognitive goals. Upon arrival, patient was awake while sitting upright in the wheelchair. SLP facilitated session by bringing the patient to the dayroom to a table to maximize attention to self-feeding. Patient consumed lunch meal of Dys. 2 textures with thin liquids with overt cough X 1, suspect due to large, sequential sips and required Max-Total A for use of small bites. Patient demonstrated selective attention to self-feeding in a mildly distracting environment with Mod A verbal cues for ~2 minutes. Patient appeared brighter this session with increased ability to express his wants/needs but continues to require Max-Total A multimodal cues to self-monitor and correct perseveration. Patient left upright in wheelchair with all needs within reach and husband present. Continue with current plan of care.   Function:  Eating Eating   Modified Consistency Diet: Yes Eating Assist Level: More than reasonable amount of time;Supervision or verbal cues;Helper scoops food on utensil;Helper checks for pocketed food   Eating Set Up Assist For: Opening containers;Cutting food Helper [place] on Utensil: Occasionally Helper [place] to Mouth: Occasionally   Cognition Comprehension Comprehension assist level: Understands basic 25 - 49% of the time/ requires cueing 50 - 75% of the time  Expression   Expression assist level: Expresses basic 25 - 49% of the time/requires cueing 50 -  75% of the time. Uses single words/gestures.  Social Interaction Social Interaction assist level: Interacts appropriately 25 - 49% of time - Needs frequent  redirection.  Problem Solving Problem solving assist level: Solves basic less than 25% of the time - needs direction nearly all the time or does not effectively solve problems and may need a restraint for safety  Memory Memory assist level: Recognizes or recalls 25 - 49% of the time/requires cueing 50 - 75% of the time    Pain Pain Assessment Pain Assessment: Faces Faces Pain Scale: Hurts even more Pain Type: Acute pain Pain Location: Head Pain Orientation: Anterior Pain Onset: On-going Pain Intervention(s): Medication (See eMAR)  Therapy/Group: Individual Therapy  Sanjith Siwek 04/13/2016, 12:43 PM

## 2016-04-13 NOTE — Progress Notes (Signed)
[facility] PHYSICAL MEDICINE & REHABILITATION     PROGRESS NOTE  Subjective/Complaints:  Patient lying in bed this morning. Husband is at bedside.  ROS: Unable to obtain due to mental condition.  Objective: Vital Signs: Blood pressure 118/78, pulse 55, temperature 98.6 F (37 C), temperature source Oral, resp. rate 18, SpO2 100 %. No results found. No results for input(s): WBC, HGB, HCT, PLT in the last 72 hours. No results for input(s): NA, K, CL, GLUCOSE, BUN, CREATININE, CALCIUM in the last 72 hours.  Invalid input(s): CO CBG (last 3)  No results for input(s): GLUCAP in the last 72 hours.  Wt Readings from Last 3 Encounters:  04/06/16 73.6 kg (162 lb 4.1 oz)  01/22/16 74.9 kg (165 lb 2 oz)  12/06/15 73.483 kg (162 lb)    Physical Exam:  BP 118/78 mmHg  Pulse 55  Temp(Src) 98.6 F (37 C) (Oral)  Resp 18  SpO2 100% Constitutional: She appears well-developed. Obese. NAD. Vital signs reviewed. HENT:  Normocephalic and atraumatic.  White plaque on tongueEyes: Conjunctivae and EOM are normal.     Cardiovascular: Normal rate and regular rhythm.    Respiratory: Effort normal and breath sounds normal. No respiratory distress.   GI: Soft. Bowel sounds are normal. She exhibits no distension.  Musculoskeletal: She exhibits no edema or no tenderness.  Neurological: She is alert.  Appears to have a right gaze preference with left neglect. Inconsistently follows commands Expressive greater than receptive aphasia Dysarthria Left facial weakness Motor:? Reliability, moving right upper and right lower extremity freely. Not moving left side.  MAS 0/4 Elbow flexion In left upper extremity (previously 1/4) Skin: Skin is warm and dry.  Psychiatric:  Unable to assess    Assessment/Plan: 1. Functional deficits secondary to right basal ganglia hemorrhage which require 3+ hours per day of interdisciplinary therapy in a comprehensive inpatient rehab setting. Physiatrist is providing  close team supervision and 24 hour management of active medical problems listed below. Physiatrist and rehab team continue to assess barriers to discharge/monitor patient progress toward functional and medical goals.  Function:  Bathing Bathing position   Position: Bed  Bathing parts Body parts bathed by patient: Chest, Abdomen, Left arm, Front perineal area Body parts bathed by helper: Buttocks, Right upper leg, Left upper leg, Right lower leg, Left lower leg, Back, Right arm  Bathing assist Assist Level: Touching or steadying assistance(Pt > 75%)      Upper Body Dressing/Undressing Upper body dressing   What is the patient wearing?: Pull over shirt/dress     Pull over shirt/dress - Perfomed by patient: Thread/unthread right sleeve, Put head through opening Pull over shirt/dress - Perfomed by helper: Thread/unthread left sleeve, Pull shirt over trunk        Upper body assist Assist Level: 2 helpers      Lower Body Dressing/Undressing Lower body dressing   What is the patient wearing?: Pants     Pants- Performed by patient: Thread/unthread right pants leg, Pull pants up/down Pants- Performed by helper: Thread/unthread left pants leg, Thread/unthread right pants leg, Pull pants up/down   Non-skid slipper socks- Performed by helper: Don/doff right sock, Don/doff left sock               TED Hose - Performed by helper: Don/doff right TED hose, Don/doff left TED hose  Lower body assist Assist for lower body dressing: 2 Helpers      Toileting Toileting Toileting activity did not occur: No continent bowel/bladder event Toileting steps  completed by patient: Performs perineal hygiene Toileting steps completed by helper: Adjust clothing prior to toileting, Performs perineal hygiene, Adjust clothing after toileting Toileting Assistive Devices: Other (comment) (Bed pan )  Toileting assist Assist level: Two helpers   Transfers Chair/bed transfer   Chair/bed transfer method:  Stand pivot Chair/bed transfer assist level: Maximal assist (Pt 25 - 49%/lift and lower) Chair/bed transfer assistive device: Armrests     Locomotion Ambulation Ambulation activity did not occur: Safety/medical concerns         Wheelchair       Assist Level: Dependent (Pt equals 0%)  Cognition Comprehension Comprehension assist level: Understands basic 25 - 49% of the time/ requires cueing 50 - 75% of the time  Expression Expression assist level: Expresses basic 25 - 49% of the time/requires cueing 50 - 75% of the time. Uses single words/gestures.  Social Interaction Social Interaction assist level: Interacts appropriately 25 - 49% of time - Needs frequent redirection.  Problem Solving Problem solving assist level: Solves basic less than 25% of the time - needs direction nearly all the time or does not effectively solve problems and may need a restraint for safety  Memory Memory assist level: Recognizes or recalls 25 - 49% of the time/requires cueing 50 - 75% of the time     Medical Problem List and Plan: 1.Left hemiplegia, aphasia, dysphagia secondary to right basal ganglia hemorrhage secondary to hypertensive crisis on 04/01/16.   Continue CIR  PRAFO for left lower extremity and splints for left upper extremity ordered, however 1 hand splint still pending   Fluoxetine 10 started on 5/1 2. DVT Prophylaxis/Anticoagulation: Subcutaneous heparin for DVT prophylaxis initiated 04/07/2016 3. Pain Management/migraine headaches: Fioricet for headaches and Robaxin. 4. Dysphagia. #2 thin liquid diet. Speech therapy follow-up. Will advance diet as tolerated 5. Neuropsych: This patient is not capable of making decisions on her own behalf. 6. Skin/Wound Care: Routine skin checks 7. Fluids/Electrolytes/Nutrition: Routine I&O   BMP within normal range on 4/27 8. Hypertension. Plendil 10 mg daily, Bysystolic 20 mg every 12 hours, Hydralazine 10 mg 3 times a day. Monitor with increased  mobility  lisinopril 20 mg twice a day, increased to 40 mg on 4/28 9. Hyperlipidemia. Lipitor 10. Spasticity: Well controlled  Continue baclofen 5 mg on 4/27 11. Thrush:   Improving   Continue Diflucan   LOS (Days) 5 A FACE TO FACE EVALUATION WAS PERFORMED  Sugey Trevathan [name] 04/13/2016 8:35 AM

## 2016-04-13 NOTE — Progress Notes (Signed)
Physical Therapy Session Note  Patient Details  Name: Abigail Campos MRN: [id number] Date of Birth: [birth year]  Today's Date: 04/13/2016 PT Individual Time: 1000-1100 PT Individual Time Calculation (min): 60 min   Short Term Goals: Week 1:  PT Short Term Goal 1 (Week 1): Patient will maintain sitting balance with supervision x 5 minutes.  PT Short Term Goal 2 (Week 1): Patient will follow basic one step commands 75% of available opportunities.  PT Short Term Goal 3 (Week 1): Patient will tolerate standing with use of equipment x 3 min.  PT Short Term Goal 4 (Week 1): Patient will initiate ambulation.  Skilled Therapeutic Interventions/Progress Updates:    Pt received sleeping in bed, slightly difficult to arouse but agreeable to therapy.  Supine>sit with max assist to elevate trunk and maneuver LLE to EOB.  Stand/pivot from EOB to [facility] with max assist, therapist blocking L knee while pt able to take a step towards BSC.  Total assist for clothing management while husband steadied BSC.  Pt successfully urinated on BSC and able to perform sit<>stand for hygiene with mod assist for standing balance.  W/C pulled up behind patient and pt able to sit and scoot back with mod/max assist.  Sit<>stand in hallway with R hand rail with max assist for ascent and to control descent.  Weight shifting R/L with focus on bilat hip extension, L knee extension, and upright posture.  Gait training x3 feet with total assist for maintaining upright posture and advancing LLE.  Pt reluctant to weight shift to LLE to advance RLE and demos R hip hike with knee hyperextended during swing phase on R.  Repeated trials of sit<>stand focus on static stance with equal weight bearing through BLEs and weight shift R and L, including trial with EVA walker.  Pt demos improved upright posture with EVA walker.  Pt returned to room at end of session and left upright in w/c with QRB in place, husband present, and call bell in reach.  NT  notified to assist pt back to bed shortly.    Therapy Documentation Precautions:  Precautions Precautions: Fall Precaution Comments: left hemi, L inattention, impulsive Restrictions Weight Bearing Restrictions: No   See Function Navigator for Current Functional Status.   Therapy/Group: Individual Therapy  [name] 04/13/2016, 12:36 PM

## 2016-04-14 ENCOUNTER — Inpatient Hospital Stay (HOSPITAL_COMMUNITY): Payer: Managed Care, Other (non HMO) | Admitting: Speech Pathology

## 2016-04-14 ENCOUNTER — Inpatient Hospital Stay (HOSPITAL_COMMUNITY): Payer: Managed Care, Other (non HMO) | Admitting: Physical Therapy

## 2016-04-14 ENCOUNTER — Inpatient Hospital Stay (HOSPITAL_COMMUNITY): Payer: Managed Care, Other (non HMO) | Admitting: Occupational Therapy

## 2016-04-14 LAB — BASIC METABOLIC PANEL
Anion gap: 9 (ref 5–15)
BUN: 12 mg/dL (ref 6–20)
CO2: 25 mmol/L (ref 22–32)
Calcium: 9.4 mg/dL (ref 8.9–10.3)
Chloride: 102 mmol/L (ref 101–111)
Creatinine, Ser: 0.89 mg/dL (ref 0.44–1.00)
GFR calc Af Amer: >60 mL/min (ref >60)
GFR calc non Af Amer: >60 mL/min (ref >60)
Glucose, Bld: 100 mg/dL — ABNORMAL HIGH (ref 65–99)
Potassium: 4.1 mmol/L (ref 3.5–5.1)
Sodium: 136 mmol/L (ref 135–145)

## 2016-04-14 LAB — CBC WITH DIFFERENTIAL/PLATELET
Basophils Absolute: 0 10*3/uL (ref 0.0–0.1)
Basophils Relative: 0 %
Eosinophils Absolute: 0.1 10*3/uL (ref 0.0–0.7)
Eosinophils Relative: 1 %
HCT: 38.4 % (ref 36.0–46.0)
Hemoglobin: 12.2 g/dL (ref 12.0–15.0)
Lymphocytes Relative: 21 %
Lymphs Abs: 1.7 10*3/uL (ref 0.7–4.0)
MCH: 29.1 pg (ref 26.0–34.0)
MCHC: 31.8 g/dL (ref 30.0–36.0)
MCV: 91.6 fL (ref 78.0–100.0)
Monocytes Absolute: 0.9 10*3/uL (ref 0.1–1.0)
Monocytes Relative: 11 %
Neutro Abs: 5.2 10*3/uL (ref 1.7–7.7)
Neutrophils Relative %: 67 %
Platelets: 258 10*3/uL (ref 150–400)
RBC: 4.19 MIL/uL (ref 3.87–5.11)
RDW: 13.6 % (ref 11.5–15.5)
WBC: 7.8 10*3/uL (ref 4.0–10.5)

## 2016-04-14 NOTE — Progress Notes (Signed)
Physical Therapy Session Note  Patient Details  Name: Abigail Campos MRN: [id number] Date of Birth: 02/02/1971  Today's Date: 04/14/2016 PT Individual Time: 1400-1426 PT Individual Time Calculation (min): 26 min   Short Term Goals: Week 1:  PT Short Term Goal 1 (Week 1): Patient will maintain sitting balance with supervision x 5 minutes.  PT Short Term Goal 2 (Week 1): Patient will follow basic one step commands 75% of available opportunities.  PT Short Term Goal 3 (Week 1): Patient will tolerate standing with use of equipment x 3 min.  PT Short Term Goal 4 (Week 1): Patient will initiate ambulation.  Skilled Therapeutic Interventions/Progress Updates:    Pt received sleeping in bed, difficult to arouse and withdrawn compared to previous sessions.  Pt with no c/o pain but reluctant to participate in therapy but agreeable with max encouragement.  Supine>sit with max assist for mobilizing LLE to EOB and elevating trunk.  Pt noted to be diaphoretic upon sitting and vitals assessed sitting EOB.  Pt able to maintain static sitting balance at EOB x10 minutes while vitals assessed and throughout discussion with RN, requiring steady assist to mod assist for upright posture.  Pt noted to have elevated BP and tmax, see below.  Pt requesting to return to bed at this time and positioned in supine with max assist.  RN present in room, call bell in reach, and needs met.  Missed 49 minutes of skilled PT.    Therapy Documentation Precautions:  Precautions Precautions: Fall Precaution Comments: left hemi, L inattention, impulsive Restrictions Weight Bearing Restrictions: No General: PT Amount of Missed Time (min): 49 Minutes PT Missed Treatment Reason: Patient ill (Comment) (BP 136/100, tmax 99.1) Vital Signs: Therapy Vitals Temp: 99.1 F (37.3 C) Temp Source: Oral Pulse Rate: 82 Resp: 18 BP: (!) 136/100 mmHg Patient Position (if appropriate): Sitting Oxygen Therapy SpO2: 100 %    See  Function Navigator for Current Functional Status.   Therapy/Group: Individual Therapy   E Penven-Crew 04/14/2016, 2:32 PM  

## 2016-04-14 NOTE — Progress Notes (Signed)
[facility] PHYSICAL MEDICINE & REHABILITATION     PROGRESS NOTE  Subjective/Complaints:  Patient lying in bed this morning. She is wearing her braces. Husband is present sleeping.  ROS: Unable to obtain due to mental condition.  Objective: Vital Signs: Blood pressure 128/81, pulse 56, temperature 98.5 F (36.9 C), temperature source Oral, resp. rate 18, SpO2 100 %. No results found.  Recent Labs  04/14/16 0435  WBC 7.8  HGB 12.2  HCT 38.4  PLT 258    Recent Labs  04/14/16 0435  NA 136  K 4.1  CL 102  GLUCOSE 100*  BUN 12  CREATININE 0.89  CALCIUM 9.4   CBG (last 3)  No results for input(s): GLUCAP in the last 72 hours.  Wt Readings from Last 3 Encounters:  04/06/16 73.6 kg (162 lb 4.1 oz)  01/22/16 74.9 kg (165 lb 2 oz)  12/06/15 73.483 kg (162 lb)    Physical Exam:  BP 128/81 mmHg  Pulse 56  Temp(Src) 98.5 F (36.9 C) (Oral)  Resp 18  SpO2 100% Constitutional: She appears well-developed. Obese. NAD. Vital signs reviewed. HENT:  Normocephalic and atraumatic.  White plaque on tongue Eyes: Conjunctivae and EOM are normal.     Cardiovascular: Normal rate and regular rhythm.    Respiratory: Effort normal and breath sounds normal. No respiratory distress.   GI: Soft. Bowel sounds are normal. She exhibits no distension.  Musculoskeletal: She exhibits no edema or no tenderness.  Neurological: She is alert.  Inconsistently follows commands Expressive and receptive aphasia Dysarthria Left facial weakness Motor: Moving right upper and right lower extremity freely. Not moving left side.  MAS 0/4 Elbow flexion In left upper extremity (previously 1/4) Skin: Skin is warm and dry.  Psychiatric:  Unable to assess    Assessment/Plan: 1. Functional deficits secondary to right basal ganglia hemorrhage which require 3+ hours per day of interdisciplinary therapy in a comprehensive inpatient rehab setting. Physiatrist is providing close team supervision and 24 hour  management of active medical problems listed below. Physiatrist and rehab team continue to assess barriers to discharge/monitor patient progress toward functional and medical goals.  Function:  Bathing Bathing position   Position: Other (comment) (sitting EOB for UB, LB in supine)  Bathing parts Body parts bathed by patient: Chest, Abdomen, Left arm, Front perineal area Body parts bathed by helper: Buttocks, Right upper leg, Left upper leg, Right lower leg, Left lower leg, Back, Right arm  Bathing assist Assist Level:  (mod A)      Upper Body Dressing/Undressing Upper body dressing   What is the patient wearing?: Pull over shirt/dress, Bra Bra - Perfomed by patient: Thread/unthread right bra strap Bra - Perfomed by helper: Thread/unthread left bra strap, Hook/unhook bra (pull down sports bra) Pull over shirt/dress - Perfomed by patient: Thread/unthread right sleeve, Put head through opening Pull over shirt/dress - Perfomed by helper: Thread/unthread left sleeve, Pull shirt over trunk        Upper body assist Assist Level:  (max A)      Lower Body Dressing/Undressing Lower body dressing   What is the patient wearing?: Pants, Non-skid slipper socks     Pants- Performed by patient: Thread/unthread right pants leg Pants- Performed by helper: Thread/unthread left pants leg, Thread/unthread right pants leg, Pull pants up/down   Non-skid slipper socks- Performed by helper: Don/doff right sock, Don/doff left sock               TED Hose - Performed by helper:  Don/doff right TED hose, Don/doff left TED hose  Lower body assist Assist for lower body dressing:  (total A)      Toileting Toileting Toileting activity did not occur: No continent bowel/bladder event Toileting steps completed by patient: Performs perineal hygiene Toileting steps completed by helper: Adjust clothing prior to toileting, Adjust clothing after toileting Toileting Assistive Devices:  [facility])  Toileting  assist Assist level: Two helpers   Transfers Chair/bed transfer   Chair/bed transfer method: Squat pivot, Stand pivot Chair/bed transfer assist level: Maximal assist (Pt 25 - 49%/lift and lower) Chair/bed transfer assistive device: Armrests     Locomotion Ambulation Ambulation activity did not occur: Safety/medical concerns   Max distance: 3 Assist level: Total assist (Pt < 25%)   Wheelchair       Assist Level: Dependent (Pt equals 0%)  Cognition Comprehension Comprehension assist level: Understands basic 25 - 49% of the time/ requires cueing 50 - 75% of the time  Expression Expression assist level: Expresses basic 25 - 49% of the time/requires cueing 50 - 75% of the time. Uses single words/gestures.  Social Interaction Social Interaction assist level: Interacts appropriately 25 - 49% of time - Needs frequent redirection.  Problem Solving Problem solving assist level: Solves basic less than 25% of the time - needs direction nearly all the time or does not effectively solve problems and may need a restraint for safety  Memory Memory assist level: Recognizes or recalls 25 - 49% of the time/requires cueing 50 - 75% of the time     Medical Problem List and Plan: 1.Left hemiplegia, aphasia, dysphagia secondary to right basal ganglia hemorrhage secondary to hypertensive crisis on 04/01/16.   Continue CIR  PRAFO for left lower extremity and splints for left upper extremity ordered, however 1 hand splint still pending   Fluoxetine 10 started on 5/1 2. DVT Prophylaxis/Anticoagulation: Subcutaneous heparin for DVT prophylaxis initiated 04/07/2016 3. Pain Management/migraine headaches: Fioricet for headaches and Robaxin. 4. Dysphagia. #2 thin liquid diet. Speech therapy follow-up. Will advance diet as tolerated 5. Neuropsych: This patient is not capable of making decisions on her own behalf. 6. Skin/Wound Care: Routine skin checks 7. Fluids/Electrolytes/Nutrition: Routine I&O   BMP within  normal range on 5/2 8. Hypertension. Plendil 10 mg daily, Bysystolic 20 mg every 12 hours, Hydralazine 10 mg 3 times a day. Monitor with increased mobility  lisinopril 20 mg twice a day, increased to 40 mg on 4/28 9. Hyperlipidemia. Lipitor 10. Spasticity: Controlled  Continue baclofen 5 mg on 4/27 11. Thrush:   Improving   Continue Diflucan until 5/6  LOS (Days) 6 A FACE TO FACE EVALUATION WAS PERFORMED  Ankit [name] 04/14/2016 8:27 AM

## 2016-04-14 NOTE — Progress Notes (Signed)
Occupational Therapy Session Note  Patient Details  Name: Abigail Campos MRN: [id number] Date of Birth: February 28, 1971  Today's Date: 04/14/2016 OT Individual Time: 1000-1100 OT Individual Time Calculation (min): 60 min    Short Term Goals: Week 1:  OT Short Term Goal 1 (Week 1): Pt will participate in 5 minutes of self care tasks with 2 breaks or less in order to increase alertness and endurance for functional tasks. OT Short Term Goal 2 (Week 1): Pt will visually track to the L to locate 3 items during OT intervention with min verbal cues. OT Short Term Goal 3 (Week 1): Pt will perform transfer with one person to toilet in order to decrease level of assist needed with functional transfers.   Skilled Therapeutic Interventions/Progress Updates:    Upon entering the room, pt supine in bed with husband present in room. Pt with 7/10 c/o pain described as headache. RN gave medication prior to OT arrival. Pt performed supine >sit with max A to EOB. Pt needing min a for sitting balance on EOB. Pt performed squat pivot transfer from EOB >wheelchair with max A for safety. OT propelled wheelchair to dayroom with total A for time management. Pt seated at table with breakfast tray. Pt needing encouragement to eat as she recently took pain medications. Pt oriented to self and situation. Pt reporting, "I had a stroke."  Pt needing occasional cues and assist to take a few bites or food and drink from cup. Pt attempting to pull hat over eyes as she report being light sensitive.  Pt began complaining of "hot flash" prior to transitioning to SLP session.   Therapy Documentation Precautions:  Precautions Precautions: Fall Precaution Comments: left hemi, L inattention, impulsive Restrictions Weight Bearing Restrictions: No   See Function Navigator for Current Functional Status.   Therapy/Group: Individual Therapy  Phineas Semen 04/14/2016, 12:53 PM

## 2016-04-14 NOTE — Progress Notes (Signed)
Speech Language Pathology Daily Session Note  Patient Details  Name: Abigail Campos MRN: [id number] Date of Birth: 09-Oct-1971  Today's Date: 04/14/2016 SLP Individual Time: 1100-1145 SLP Individual Time Calculation (min): 45 min  Short Term Goals: Week 1: SLP Short Term Goal 1 (Week 1): Patient will consume current diet with minimal overt s/s of aspiration with Mod A verbal cues for use of swallowing strategies.  SLP Short Term Goal 2 (Week 1): Patient will demonstrate sustained attention to functional tasks for 5 minutes with Min A verbal cues for redirection.  SLP Short Term Goal 3 (Week 1): Patient will attend to left field of enviornment during functional tasks with Mod A verbal ces.  SLP Short Term Goal 4 (Week 1): Patient will answer basic yes/no questions with 50% accuracy in regards to wants/needs with Mod A multimodal cues.  SLP Short Term Goal 5 (Week 1): Patient will utilize speech intelligibility strategies at the word level with Max A verbal cues to achieve 50% intelligibility.,  SLP Short Term Goal 6 (Week 1): Patient will name common objects with 50% accuracy with Max A multimodal cues.   Skilled Therapeutic Interventions: Skilled treatment session focused on cognitive-linguistic goals. Upon arrival, patient was sitting upright in the wheelchair. SLP facilitated session by providing overall Mod-Max A multimodal cues for patient to self-monitor and correct verbal perseveration at the word level in regards to naming functional items and for identifying similarities and differences between the items at the word level. Patient required Max A multimodal cues for sustained attention to task due to reports of patient being "hot." Patient then reported nausea and began to dry heave. RN made aware and patient transferred back to bed with +2 assist. Patient missed remaining 15 minutes of session. Continue with current plan of care.    Function:   Cognition Comprehension Comprehension  assist level: Understands basic 50 - 74% of the time/ requires cueing 25 - 49% of the time  Expression   Expression assist level: Expresses basic 25 - 49% of the time/requires cueing 50 - 75% of the time. Uses single words/gestures.  Social Interaction Social Interaction assist level: Interacts appropriately 25 - 49% of time - Needs frequent redirection.  Problem Solving Problem solving assist level: Solves basic less than 25% of the time - needs direction nearly all the time or does not effectively solve problems and may need a restraint for safety  Memory Memory assist level: Recognizes or recalls 25 - 49% of the time/requires cueing 50 - 75% of the time    Pain Reports nausea but no pain   Therapy/Group: Individual Therapy  Abigail Campos 04/14/2016, 3:51 PM

## 2016-04-14 NOTE — Progress Notes (Signed)
Social Work  Social Work Assessment and Plan  Patient Details  Name: Abigail Campos MRN: [id number] Date of Birth: 05/31/1971  Today's Date: 04/10/2016  Problem List:  Patient Active Problem List   Diagnosis Date Noted  . Muscle spasticity   . Hypertensive emergency 04/08/2016  . Obesity (BMI 30.0-34.9) 04/08/2016  . Hyperlipidemia 04/08/2016  . Basal ganglia hemorrhage ([place]) 04/08/2016  . Aphasia as late effect of cerebrovascular accident   . Dysphagia as late effect of cerebrovascular disease   . Migraine with aura and without status migrainosus, not intractable   . HLD (hyperlipidemia)   . Benign essential HTN   . Gait disturbance, post-stroke   . Hemiplegia, post-stroke ([place])   . Hypokalemia   . Dysphagia, post-stroke   . Aphasia, post-stroke   . Bradycardia   . Headache, migraine   . Dysarthria, post-stroke   . Encephalopathy acute 04/02/2016  . ICH (intracerebral hemorrhage) ([facility]) - R basal ganglia due to hypertensive emergency 04/01/2016  . Upper airway cough syndrome 12/06/2015  . PCP NOTES >>> 09/25/2015  . Acute bronchitis 11/06/2014  . Need for hepatitis B vaccination 10/19/2014  . Insomnia 12/28/2012  . Annual physical exam 11/23/2011  . Hemorrhoids 10/13/2011  . Morbid obesity ([place]) 02/26/2011  . CHEST PAIN UNSPECIFIED 03/28/2010  . Essential hypertension 10/19/2007  . Headache(784.0) 05/12/2007   Past Medical History:  Past Medical History  Diagnosis Date  . Headache(784.0)     developed migraines after 2nd pregnancy, had a negative CT of the head  . Hypertension   . Essential hypertension 10/19/2007    03-2010: metoprolol changed to bystolic (was not feeling well on it, no specific allergy or reaction)     Past Surgical History:  Past Surgical History  Procedure Laterality Date  . Inguinal hernia repair      2004  . Cesarean section      [id number]  . Abdominal hysterectomy  11-14-07    no oophorectomy per surgical report   Social History:   reports that she has never smoked. She has never used smokeless tobacco. She reports that she does not drink alcohol or use illicit drugs.  Family / Support Systems Marital Status: Married How Long?: 12 yrs Patient Roles: Spouse, Parent Spouse/Significant Other: husband, Lorilyn Moncayo @ (C) [phone] Children: pt and husband have two children;  36 yo daughter, Naaman Plummer (completing her senior yr in [place]) and 40 yo son, Ronney Lion. Other Supports: pt's mother, Dorothe Pea @ [phone] or ([phone] (and father) living locally;  sister, Rufina Falco @ (C) [phone];  husband's brother and sister-in-law living in [place], however, very supportive and have made multiple trips to [facility] since hospitalized. Anticipated Caregiver: husband, sister, mom, dad Ability/Limitations of Caregiver: Husband works.  He will have caregiver assistance from sister, mom and dad. Caregiver Availability: 24/7 Family Dynamics: Husband describes very good support from their families.  He is very encouraging and attentive to pt during interview.  Husband feels they will have excellent support from family and friends.  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  Pt does  awaken mid way through interview.  Smiling at all in the room.  Able to provide very basic information about herself.  She does not appear to be in any emotional distress at this time, however, will plan to have neuropsychology consult when team feels appropriate.  Positve interactions between pt and spouse with them laughing with one another. Recent Psychosocial Issues: None Pyschiatric History: None Substance Abuse History: None  Patient / Family Perceptions, Expectations & Goals Pt/Family understanding of illness & functional limitations: Pt able to report that she had a "stroke" when directly asked but allowed time to think about this.  Spouse and family with good understanding of her hemorrhage and current functional limitations, however, will need continued education on longer term recovery issues that are possible. Premorbid pt/family roles/activities: Pt was working f/t, active with her family and completely indepedent.  Close family who spend time with one another frequently. Anticipated changes in roles/activities/participation: Pt is expected to require 24/7 assistance upon d/c and spouse reports this will be shared among family. Pt/family expectations/goals: Husband's primary goal is to "see that left side start to work."  [employer]: None Premorbid Home Care/DME Agencies: None Transportation available at discharge: yes Resource referrals recommended: Neuropsychology, Support group (specify)  Discharge Planning Living Arrangements: Spouse/significant other, Children Support Systems: Spouse/significant other, Children, Parent, Other relatives, Friends/neighbors Type of Residence: Private residence Insurance Resources: Multimedia programmer (specify) Scientist, clinical (histocompatibility and immunogenetics)) Financial Resources: Employment Museum/gallery curator Screen Referred: No Living Expenses: Higher education careers adviser Management: Spouse Does the patient have any problems obtaining your medications?: No Home Management: pt  and family Patient/Family Preliminary Plans: Pt will d/c home with spouse as primary support/ caregiver with additional assist from family Social Work Anticipated Follow Up Needs: HH/OP, Support Group Expected length of stay: 21-24 days  Clinical Impression Very unfortunate woman, wife and mother here following a Right BG hemorrhage and with significant physical and cognitive deficits.  Husband and family very involved and supportive and appear to have a good understanding of medical issues and pt's limitations.  Husband and family prepared to provide 24/7 care. Will follow for emotional support and d/c planning needs.  Zoei Amison 04/10/2016, 9:15 AM

## 2016-04-15 ENCOUNTER — Inpatient Hospital Stay (HOSPITAL_COMMUNITY): Payer: Managed Care, Other (non HMO) | Admitting: Speech Pathology

## 2016-04-15 ENCOUNTER — Inpatient Hospital Stay (HOSPITAL_COMMUNITY): Payer: Managed Care, Other (non HMO) | Admitting: Physical Therapy

## 2016-04-15 ENCOUNTER — Inpatient Hospital Stay (HOSPITAL_COMMUNITY): Payer: Managed Care, Other (non HMO) | Admitting: Occupational Therapy

## 2016-04-15 DIAGNOSIS — E639 Nutritional deficiency, unspecified: Secondary | ICD-10-CM | POA: Insufficient documentation

## 2016-04-15 MED ORDER — DIPHENHYDRAMINE HCL 25 MG PO CAPS
25.0000 mg | ORAL_CAPSULE | Freq: Four times a day (QID) | ORAL | Status: DC | PRN
  Administered 2016-04-15: 25 mg via ORAL
  Filled 2016-04-15: qty 1

## 2016-04-15 NOTE — Progress Notes (Signed)
Physical Therapy Weekly Progress Note  Patient Details  Name: Abigail Campos MRN: [id number] Date of Birth: 1971-01-31  Beginning of progress report period: April 08, 2016 End of progress report period: Apr 15, 2016  Today's Date: 04/15/2016 PT Individual Time: 0488-8916 PT Individual Time Calculation (min): 48 min   Patient has met 3 of 4 short term goals.  Pt has made progress with PT this week with transfers, balance, activity tolerance, and attention.  Pt demonstrates ability to follow single step commands, is able to initiate movement with increased time and assist for safety, and was able to initiate supported standing and gait training.  Pt continues to demonstrate deficits in intellectual and safety awareness, unbalanced muscle activation, tone, midline orientation, and muscle weakness and therefore will continue to benefit from skilled PT intervention to enhance overall performance with activity tolerance, balance, postural control, functional use of  left upper extremity and left lower extremity, attention, awareness and coordination.  Patient progressing toward long term goals..  Continue plan of care.  PT Short Term Goals Week 1:  PT Short Term Goal 1 (Week 1): Patient will maintain sitting balance with supervision x 5 minutes.  PT Short Term Goal 1 - Progress (Week 1): Not met PT Short Term Goal 2 (Week 1): Patient will follow basic one step commands 75% of available opportunities.  PT Short Term Goal 2 - Progress (Week 1): Met PT Short Term Goal 3 (Week 1): Patient will tolerate standing with use of equipment x 3 min.  PT Short Term Goal 3 - Progress (Week 1): Met PT Short Term Goal 4 (Week 1): Patient will initiate ambulation. PT Short Term Goal 4 - Progress (Week 1): Met Week 2:  PT Short Term Goal 1 (Week 2): Pt will maintain static sitting balance with close supervision. PT Short Term Goal 2 (Week 2): Pt will demonstrate dynamic sitting balance with mod assist PT Short  Term Goal 3 (Week 2): Pt will tolerate supported standing during functional task x3 minutes PT Short Term Goal 4 (Week 2): Pt will amb 10' with LRAD  Skilled Therapeutic Interventions/Progress Updates:    Pt received sleeping in bed, minimally difficult to arouse but agreeable to therapy.  Supine>sit via L side lying with mod assist for trunk elevation.  Stand/pivot from EOB to w/c to pt's R with max assist and multimodal cues.  Pt able to wash face with set up for wet cloth at sink w/c level.  Pt voicing need to toilet and performed max assist stand/pivot transfer to toilet.  Max assist for clothing management, pt able to perform hygiene.  Pt requiring min>mod assist for balance while on toilet.  Stand/pivot back to w/c to pt's L with total assist.  Sit<>stand in sara+ for trial with supported standing and pt tolerated well x2 minutes, and able to initiate R<>L weight shift, though somewhat inhibited by equipment.  Pt returned to sitting and engaged in cognitive task of solving a puzzle while remaining upright in w/c.  Handoff to SLP in dayroom.    Therapy Documentation Precautions:  Precautions Precautions: Fall Precaution Comments: left hemi, L inattention, impulsive Restrictions Weight Bearing Restrictions: No   See Function Navigator for Current Functional Status.  Therapy/Group: Individual Therapy  Mays Paino E Penven-Crew 04/15/2016, 1:28 PM

## 2016-04-15 NOTE — Progress Notes (Signed)
Speech Language Pathology Daily Session Note  Patient Details  Name: Abigail Campos MRN: [id number] Date of Birth: Jun 21, 1971  Today's Date: 04/15/2016 SLP Individual Time: 1100-1200 SLP Individual Time Calculation (min): 60 min  Short Term Goals: Week 1: SLP Short Term Goal 1 (Week 1): Patient will consume current diet with minimal overt s/s of aspiration with Mod A verbal cues for use of swallowing strategies.  SLP Short Term Goal 2 (Week 1): Patient will demonstrate sustained attention to functional tasks for 5 minutes with Min A verbal cues for redirection.  SLP Short Term Goal 3 (Week 1): Patient will attend to left field of enviornment during functional tasks with Mod A verbal ces.  SLP Short Term Goal 4 (Week 1): Patient will answer basic yes/no questions with 50% accuracy in regards to wants/needs with Mod A multimodal cues.  SLP Short Term Goal 5 (Week 1): Patient will utilize speech intelligibility strategies at the word level with Max A verbal cues to achieve 50% intelligibility.,  SLP Short Term Goal 6 (Week 1): Patient will name common objects with 50% accuracy with Max A multimodal cues.   Skilled Therapeutic Interventions: Skilled treatment session focused on cognitive-linguistic and dysphagia goals. Upon arrival, patient was sitting upright in the wheelchair with her daughter and husband present. SLP facilitated session by providing Max A verbal cues for problem solving, sustained attention and attention to right field of environment during a familiar card task in which she participated in with her family. Patient also participated in a functional conversation in regards to her daughter's college of choice with overall Mod A verbal and phonemic cues to self-monitor and correct phonemic paraphasias at the phrase level. Patient declined her lunch meal of Dys. 2 textures with intermittent dry heaving and expectoration of food (likely due to taste). SLP facilitated session by  providing trials of Dys. 3 textures in which this clinician managed the bite size and rate of self-feeding. Patient consumed trials without overt s/s of aspiration but required Mod A verbal cues for attention to mastication due to fatigue. Patient consumed minimal amounts in a controlled environment, therefore, recommend continued trials with SLP only. Patient left upright in wheelchair with husband present. Continue with current plan of care.    Function:  Eating Eating   Modified Consistency Diet: Yes Eating Assist Level: More than reasonable amount of time;Helper scoops food on utensil;Supervision or verbal cues   Eating Set Up Assist For: Opening containers;Cutting food Helper [place] on Utensil: Every scoop     Cognition Comprehension Comprehension assist level: Understands basic 50 - 74% of the time/ requires cueing 25 - 49% of the time  Expression   Expression assist level: Expresses basic 50 - 74% of the time/requires cueing 25 - 49% of the time. Needs to repeat parts of sentences.  Social Interaction Social Interaction assist level: Interacts appropriately 25 - 49% of time - Needs frequent redirection.  Problem Solving Problem solving assist level: Solves basic 25 - 49% of the time - needs direction more than half the time to initiate, plan or complete simple activities  Memory Memory assist level: Recognizes or recalls 25 - 49% of the time/requires cueing 50 - 75% of the time    Pain Pain Assessment Pain Assessment: No/denies pain  Therapy/Group: Individual Therapy  Lianah Peed 04/15/2016, 4:06 PM

## 2016-04-15 NOTE — Progress Notes (Signed)
Occupational Therapy Session Note  Patient Details  Name: Abigail Campos MRN: [id number] Date of Birth: Apr 20, 1971  Today's Date: 04/15/2016 OT Individual Time: 1002-1100 OT Individual Time Calculation (min): 58 min    Short Term Goals: Week 1:  OT Short Term Goal 1 (Week 1): Pt will participate in 5 minutes of self care tasks with 2 breaks or less in order to increase alertness and endurance for functional tasks. OT Short Term Goal 2 (Week 1): Pt will visually track to the L to locate 3 items during OT intervention with min verbal cues. OT Short Term Goal 3 (Week 1): Pt will perform transfer with one person to toilet in order to decrease level of assist needed with functional transfers.   Skilled Therapeutic Interventions/Progress Updates:    Upon entering the room, pt seated in wheelchair awaiting therapist with husband and daughter present in room. Pt engaged in dressing task from wheelchair at sink. Pt needing max A for sit <>stand from wheelchair with total A to don LB clothing items. Max A to don UB clothing with constant steady assist as pt is impulsive with anterior weight shift while seated in wheelchair. Pt needing mod multimodal cues for initiation, sequencing, and attending to task. OT propelled pt via wheelchair to day room for table slides for shoulder flexion of R UE with hand over hand technique for awareness and stretching. Pt performed x 10 reps. Daughter assisted with motivation during session. Pt returned to room as she reports feeling nauseated and "over heated". RN notified. Pt in tilt in space wheelchair with quick release belt donned and family within room.   Therapy Documentation Precautions:  Precautions Precautions: Fall Precaution Comments: left hemi, L inattention, impulsive Restrictions Weight Bearing Restrictions: No  See Function Navigator for Current Functional Status.   Therapy/Group: Individual Therapy  Phineas Semen 04/15/2016, 2:36 PM

## 2016-04-15 NOTE — Progress Notes (Signed)
[place] PHYSICAL MEDICINE & REHABILITATION     PROGRESS NOTE  Subjective/Complaints:  Pt laying in bed this AM with her eyes open.  He aphasia appears slightly improved.    ROS: Unable to obtain due to mental condition.  Objective: Vital Signs: Blood pressure 98/65, pulse 58, temperature 98.2 F (36.8 C), temperature source Oral, resp. rate 16, SpO2 99 %. No results found.  Recent Labs  04/14/16 0435  WBC 7.8  HGB 12.2  HCT 38.4  PLT 258    Recent Labs  04/14/16 0435  NA 136  K 4.1  CL 102  GLUCOSE 100*  BUN 12  CREATININE 0.89  CALCIUM 9.4   CBG (last 3)  No results for input(s): GLUCAP in the last 72 hours.  Wt Readings from Last 3 Encounters:  04/06/16 73.6 kg (162 lb 4.1 oz)  01/22/16 74.9 kg (165 lb 2 oz)  12/06/15 73.483 kg (162 lb)    Physical Exam:  BP 98/65 mmHg  Pulse 58  Temp(Src) 98.2 F (36.8 C) (Oral)  Resp 16  SpO2 99% Constitutional: She appears well-developed. Obese. NAD. Vital signs reviewed. HENT:  Normocephalic and atraumatic.  White plaque on tongue Eyes: Conjunctivae and EOM are normal.     Cardiovascular: Normal rate and regular rhythm.    Respiratory: Effort normal and breath sounds normal. No respiratory distress.   GI: Soft. Bowel sounds are normal. She exhibits no distension.  Musculoskeletal: She exhibits no edema or no tenderness.  Neurological: She is alert.  Inconsistently follows commands Expressive > receptive aphasia Dysarthria Left facial weakness Motor: Moving right upper and right lower extremity freely.  Not moving left side.  MAS 0/4 Elbow flexion In left upper extremity (previously 1/4) Skin: Skin is warm and dry.  Psychiatric:  Unable to assess    Assessment/Plan: 1. Functional deficits secondary to right basal ganglia hemorrhage which require 3+ hours per day of interdisciplinary therapy in a comprehensive inpatient rehab setting. Physiatrist is providing close team supervision and 24 hour management  of active medical problems listed below. Physiatrist and rehab team continue to assess barriers to discharge/monitor patient progress toward functional and medical goals.  Function:  Bathing Bathing position   Position: Other (comment) (sitting EOB for UB, LB in supine)  Bathing parts Body parts bathed by patient: Chest, Abdomen, Left arm, Front perineal area Body parts bathed by helper: Buttocks, Right upper leg, Left upper leg, Right lower leg, Left lower leg, Back, Right arm  Bathing assist Assist Level:  (mod A)      Upper Body Dressing/Undressing Upper body dressing   What is the patient wearing?: Pull over shirt/dress, Bra Bra - Perfomed by patient: Thread/unthread right bra strap Bra - Perfomed by helper: Thread/unthread left bra strap, Hook/unhook bra (pull down sports bra) Pull over shirt/dress - Perfomed by patient: Thread/unthread right sleeve, Put head through opening Pull over shirt/dress - Perfomed by helper: Thread/unthread left sleeve, Pull shirt over trunk        Upper body assist Assist Level:  (max A)      Lower Body Dressing/Undressing Lower body dressing   What is the patient wearing?: Pants, Non-skid slipper socks     Pants- Performed by patient: Thread/unthread right pants leg Pants- Performed by helper: Thread/unthread left pants leg, Pull pants up/down   Non-skid slipper socks- Performed by helper: Don/doff right sock, Don/doff left sock               TED Hose - Performed by helper:  Don/doff right TED hose, Don/doff left TED hose  Lower body assist Assist for lower body dressing:  (total A)      Toileting Toileting Toileting activity did not occur: No continent bowel/bladder event Toileting steps completed by patient: Performs perineal hygiene Toileting steps completed by helper: Adjust clothing prior to toileting, Performs perineal hygiene, Adjust clothing after toileting Toileting Assistive Devices: Other (comment) (Bed pan )  Toileting  assist Assist level: Two helpers   Transfers Chair/bed transfer   Chair/bed transfer method: Squat pivot Chair/bed transfer assist level: Maximal assist (Pt 25 - 49%/lift and lower) Chair/bed transfer assistive device: Armrests     Locomotion Ambulation Ambulation activity did not occur: Safety/medical concerns   Max distance: 3 Assist level: Total assist (Pt < 25%)   Wheelchair       Assist Level: Dependent (Pt equals 0%)  Cognition Comprehension Comprehension assist level: Understands basic 25 - 49% of the time/ requires cueing 50 - 75% of the time  Expression Expression assist level: Expresses basic 25 - 49% of the time/requires cueing 50 - 75% of the time. Uses single words/gestures.  Social Interaction Social Interaction assist level: Interacts appropriately 25 - 49% of time - Needs frequent redirection.  Problem Solving Problem solving assist level: Solves basic 25 - 49% of the time - needs direction more than half the time to initiate, plan or complete simple activities  Memory Memory assist level: Recognizes or recalls 25 - 49% of the time/requires cueing 50 - 75% of the time     Medical Problem List and Plan: 1.Left hemiplegia, aphasia, dysphagia secondary to right basal ganglia hemorrhage secondary to hypertensive crisis on 04/01/16.   Continue CIR  PRAFO for left lower extremity and splints for left upper extremity ordered, however 1 hand splint still pending   Fluoxetine 10 started on 5/1 2. DVT Prophylaxis/Anticoagulation: Subcutaneous heparin for DVT prophylaxis initiated 04/07/2016 3. Pain Management/migraine headaches: Fioricet for headaches and Robaxin. 4. Dysphagia. #2 thin liquid diet. Speech therapy follow-up. Will advance diet as tolerated  Eating 25-30% meals.  5. Neuropsych: This patient is not capable of making decisions on her own behalf. 6. Skin/Wound Care: Routine skin checks 7. Fluids/Electrolytes/Nutrition: Routine I&O   BMP within normal range on  5/2 8. Hypertension. Plendil 10 mg daily, Bysystolic 20 mg every 12 hours, Hydralazine 10 mg 3 times a day. Monitor with increased mobility  lisinopril 20 mg twice a day, increased to 40 mg on 4/28 9. Hyperlipidemia. Lipitor 10. Spasticity: Controlled  Continue baclofen 5 mg on 4/27 11. Thrush:   Improving   Continue Diflucan until 5/6 12. Obesity  There is no weight on file to calculate BMI., will obtain  Diet and exercise education  Will cont to encourage weight loss to increase endurance and promote overall health   LOS (Days) 7 A FACE TO FACE EVALUATION WAS PERFORMED  Brisha Mccabe [name] 04/15/2016 9:41 AM

## 2016-04-15 NOTE — Progress Notes (Addendum)
Physical Therapy Session Note  Patient Details  Name: Abigail Campos MRN: [id number] Date of Birth: 06-19-1971  Today's Date: 04/15/2016 PT Individual Time: 1500-1530 PT Individual Time Calculation (min): 30 min   Short Term Goals: Week 1:  PT Short Term Goal 1 (Week 1): Patient will maintain sitting balance with supervision x 5 minutes.  PT Short Term Goal 1 - Progress (Week 1): Not met PT Short Term Goal 2 (Week 1): Patient will follow basic one step commands 75% of available opportunities.  PT Short Term Goal 2 - Progress (Week 1): Met PT Short Term Goal 3 (Week 1): Patient will tolerate standing with use of equipment x 3 min.  PT Short Term Goal 3 - Progress (Week 1): Met PT Short Term Goal 4 (Week 1): Patient will initiate ambulation. PT Short Term Goal 4 - Progress (Week 1): Met  Skilled Therapeutic Interventions/Progress Updates:    Pt received in dayroom after SLP session.  Pt reporting fatigue but willing to participate.  Pt set up at wall rail in hallway.  Pt performed sit <> stand from w/c x 2 reps with RUE support on wall rail with mod A and verbal cues for safety and initiate.  Pt performed NMR during gait x 6' + 19' in hallway with RUE support on wall rail and PRAFO donned on LLE with max A of one person with therapist providing facilitate at trunk and LLE for trunk control, weight shifting to R, activation of LLE swing phase and stabilization of LLE during stance phase.  Pt also performed NMR during one step negotiation on 2" step with RUE support on rail and therapist providing facilitation on L side for trunk control, lateral and anterior weight shifting, advancement and stabilization of LLE in stance with max A and verbal cues for safe sequence.  At end of session pt returned to room and left in w/c in tilted position with quick release belt in place and friends present to visit.    Therapy Documentation Precautions:  Precautions Precautions: Fall Precaution Comments:  left hemi, L inattention, impulsive Restrictions Weight Bearing Restrictions: No Pain: Pain Assessment Pain Assessment: No/denies pain Other Treatments: Treatments Neuromuscular Facilitation: Left;Lower Extremity;Forced use;Activity to increase motor control;Activity to increase timing and sequencing;Activity to increase lateral weight shifting;Activity to increase anterior-posterior weight shifting   See Function Navigator for Current Functional Status.   Therapy/Group: Individual Therapy  [name] [facility] 04/15/2016, 4:38 PM

## 2016-04-15 NOTE — Progress Notes (Signed)
Speech Language Pathology Daily Session Note  Patient Details  Name: Abigail Campos MRN: [id number] Date of Birth: October 19, 1971  Today's Date: 04/15/2016 SLP Individual Time: 1430-1500 SLP Individual Time Calculation (min): 30 min Makeup Therapy  Short Term Goals: Week 1: SLP Short Term Goal 1 (Week 1): Patient will consume current diet with minimal overt s/s of aspiration with Mod A verbal cues for use of swallowing strategies.  SLP Short Term Goal 2 (Week 1): Patient will demonstrate sustained attention to functional tasks for 5 minutes with Min A verbal cues for redirection.  SLP Short Term Goal 3 (Week 1): Patient will attend to left field of enviornment during functional tasks with Mod A verbal ces.  SLP Short Term Goal 4 (Week 1): Patient will answer basic yes/no questions with 50% accuracy in regards to wants/needs with Mod A multimodal cues.  SLP Short Term Goal 5 (Week 1): Patient will utilize speech intelligibility strategies at the word level with Max A verbal cues to achieve 50% intelligibility.,  SLP Short Term Goal 6 (Week 1): Patient will name common objects with 50% accuracy with Max A multimodal cues.   Skilled Therapeutic Interventions: Pt seen for therapy makeup time due to previously missed therapy time. Pt required mod A to attend to task of puzzle completion. Picture labeling within a familiar category completed with 12/15 acc for target word or acceptable approximation. Frequest phonemic paraphasias noted. Phonemic cues were beneficial to achieve target response.   Function:   Cognition Comprehension Comprehension assist level: Understands basic 50 - 74% of the time/ requires cueing 25 - 49% of the time  Expression   Expression assist level: Expresses basic 50 - 74% of the time/requires cueing 25 - 49% of the time. Needs to repeat parts of sentences.  Social Interaction Social Interaction assist level: Interacts appropriately 25 - 49% of time - Needs frequent  redirection.  Problem Solving Problem solving assist level: Solves basic 25 - 49% of the time - needs direction more than half the time to initiate, plan or complete simple activities  Memory Memory assist level: Recognizes or recalls 25 - 49% of the time/requires cueing 50 - 75% of the time    Pain Pain Assessment Pain Assessment: No/denies pain  Therapy/Group: Individual Therapy  Vinetta Bergamo MA, CCC-SLP 04/15/2016, 3:16 PM

## 2016-04-16 ENCOUNTER — Inpatient Hospital Stay (HOSPITAL_COMMUNITY): Payer: Managed Care, Other (non HMO) | Admitting: Speech Pathology

## 2016-04-16 ENCOUNTER — Inpatient Hospital Stay (HOSPITAL_COMMUNITY): Payer: Managed Care, Other (non HMO) | Admitting: Occupational Therapy

## 2016-04-16 ENCOUNTER — Inpatient Hospital Stay (HOSPITAL_COMMUNITY): Payer: Managed Care, Other (non HMO) | Admitting: Physical Therapy

## 2016-04-16 MED ORDER — MIRTAZAPINE 15 MG PO TABS
15.0000 mg | ORAL_TABLET | Freq: Every day | ORAL | Status: DC
  Administered 2016-04-16 – 2016-04-23 (×6): 15 mg via ORAL
  Filled 2016-04-16 (×9): qty 1

## 2016-04-16 NOTE — Progress Notes (Signed)
Occupational Therapy Weekly Progress Note and OT Intervention  Patient Details  Name: MARGY SUMLER MRN: [id number] Date of Birth: [birth year]  Beginning of progress report period: April 08, 2016 End of progress report period: Apr 16, 2016  Today's Date: 04/16/2016 OT Individual Time: 1001-1059 and 1131-1200 (make up session) OT Individual Time Calculation (min): 58 and 29 min (make up session)   Patient has met 2 of 3 short term goals. Pt is making slow but steady progress towards OT goals this week. Pt has been able to transfer onto standard commode with max A of 1 person. Pt is visually attending to the L during functional tasks with min verbal cues. Pt continues to have no movement from L UE.   Patient continues to demonstrate the following deficits: decreased I in self care, balance, functional mobility/transfers, strength in L UE and L LE, vision deficits, decreased cognition, attention, activity tolerance, alertness and therefore will continue to benefit from skilled OT intervention to enhance overall performance with BADL.  Patient progressing toward long term goals..  Continue plan of care.  OT Short Term Goals Week 1:  OT Short Term Goal 1 (Week 1): Pt will participate in 5 minutes of self care tasks with 2 breaks or less in order to increase alertness and endurance for functional tasks. OT Short Term Goal 1 - Progress (Week 1): Progressing toward goal OT Short Term Goal 2 (Week 1): Pt will visually track to the L to locate 3 items during OT intervention with min verbal cues. OT Short Term Goal 2 - Progress (Week 1): Met OT Short Term Goal 3 (Week 1): Pt will perform transfer with one person to toilet in order to decrease level of assist needed with functional transfers.  OT Short Term Goal 3 - Progress (Week 1): Met Week 2:  OT Short Term Goal 1 (Week 2): Pt will participate in 3 minutes of self care task with 2 rest breaks or less in order to increase alertness and endurance  for functional tasks.  OT Short Term Goal 2 (Week 2): Pt will perform UB dressing with mod A in order to decrease level of assist for self care. OT Short Term Goal 3 (Week 2): Pt will perform LB dressing with max A in order to decrease level of assist for self care.  OT Short Term Goal 4 (Week 2): Caregiver will assist pt with L UE self ROM HEP in order to ensure correct exercise performed with use of paper handout in order to demonstrate knowledge of proper technique.   Skilled Therapeutic Interventions/Progress Updates:  Session 1: Upon entering the room, pt supine in bed with husband present in room. Pt with no c/o,signs, or symptoms of paint his session. Pt performed supine >sit with mod A to EOB. Pt transferring from bed >wheelchair with max A squat pivot transfer. Pt seated in wheelchair at sink side for UB self care. Pt needing hand over hand assist in order to utilize L UE for self care tasks. Pt needing mod multimodal cues to attend in controlled environment, for sequencing, and initiation of tasks. Pt verbalizing need for toileting with therapist transferring pt from wheelchair >toilet with max A and use of grab bar. Pt needing steady assist while seated on toilet for safety secondary to impulsivity. Pt unable to successful have BM. LB dressing completed from commode for time management and energy conservation. OT standing with pt while caregiver assisted with clothing management and hygiene. Pt returned to wheelchair at end of  session with quick release belt donned. Call bell and all needed items within reach upon exiting the room.   Session 2: Upon entering the room, pt in wheelchair and sleeping soundly. Pt very difficulty to awaken and needing cold wash cloth in order to alert pt. Pt performing self ROM with L UE and hand over hand technique from therapist for proper technique. ROM exercises also utilized in order to give some awareness to L side of body. Pt needing min cues to watch L UE  during ROM tasks. Pt continues to be very lethargic and is transferred back into bed with max A stand pivot transfer into bed. Her husband arrives in room. Call bell and all needed items within reach upon exiting the room.   Therapy Documentation Precautions:  Precautions Precautions: Fall Precaution Comments: left hemi, L inattention, impulsive Restrictions Weight Bearing Restrictions: No General:   Vital Signs: Therapy Vitals Temp: 97.8 F (36.6 C) Temp Source: Axillary Pulse Rate: (!) 56 BP: (!) 109/54 mmHg Patient Position (if appropriate): Lying Oxygen Therapy SpO2: 99 % O2 Device: Not Delivered  See Function Navigator for Current Functional Status.   Therapy/Group: Individual Therapy  Phineas Semen 04/16/2016, 7:48 AM

## 2016-04-16 NOTE — Patient Care Conference (Signed)
Inpatient RehabilitationTeam Conference and Plan of Care Update Date: 04/15/2016   Time: 2:30 PM    Patient Name: Abigail Campos      Medical Record Number: [id number]  Date of Birth: [birth year] Sex: Female         Room/Bed: 4W05C/4W05C-01 Payor Info: Payor: Holland Falling / Plan: Alwyn Pea / Product Type: *No Product type* /    Admitting Diagnosis: R  BG hemorrhage  Admit Date/Time:  04/08/2016  6:25 PM Admission Comments: No comment available   Primary Diagnosis:  <principal problem not specified> Principal Problem: <principal problem not specified>  Patient Active Problem List   Diagnosis Date Noted  . Poor nutrition   . Muscle spasticity   . Hypertensive emergency 04/08/2016  . Obesity (BMI 30.0-34.9) 04/08/2016  . Hyperlipidemia 04/08/2016  . Basal ganglia hemorrhage ([place]) 04/08/2016  . Aphasia as late effect of cerebrovascular accident   . Dysphagia as late effect of cerebrovascular disease   . Migraine with aura and without status migrainosus, not intractable   . HLD (hyperlipidemia)   . Benign essential HTN   . Gait disturbance, post-stroke   . Hemiplegia, post-stroke ([place])   . Hypokalemia   . Dysphagia, post-stroke   . Aphasia, post-stroke   . Bradycardia   . Headache, migraine   . Dysarthria, post-stroke   . Encephalopathy acute 04/02/2016  . ICH (intracerebral hemorrhage) ([facility]) - R basal ganglia due to hypertensive emergency 04/01/2016  . Upper airway cough syndrome 12/06/2015  . PCP NOTES >>> 09/25/2015  . Acute bronchitis 11/06/2014  . Need for hepatitis B vaccination 10/19/2014  . Insomnia 12/28/2012  . Annual physical exam 11/23/2011  . Hemorrhoids 10/13/2011  . Morbid obesity ([place]) 02/26/2011  . CHEST PAIN UNSPECIFIED 03/28/2010  . Essential hypertension 10/19/2007  . Headache(784.0) 05/12/2007    Expected Discharge Date: Expected Discharge Date: 05/06/16  Team Members Present: Physician leading conference: Dr. Delice Lesch Social Worker Present:  Lennart Pall, LCSW Nurse Present: Dorien Chihuahua, RN PT Present: Dwyane Dee, PT OT Present: Benay Pillow, OT SLP Present: Weston Anna, SLP PPS Coordinator present : Daiva Nakayama, RN, CRRN     Current Status/Progress Goal Weekly Team Focus  Medical   Left hemiplegia, aphasia, dysphagia secondary to right basal ganglia hemorrhage secondary to hypertensive crisis on 04/01/16  Improve mobility, transfers, BP, diet, pt and family edu  see above   Bowel/Bladder   incontinent of bladder, last BM on 04/13/16  continent x2 with min to mod assist.  toilet patient with appropriate device.   Swallow/Nutrition/ Hydration   Dys. 2 textures with thin liquids, Mod-Max A multimodal cues for use of small bites/sips   Min A with least restrictive diet  Trials of Dys. 3 textures, use of swallowing strategies    ADL's   Current status varies - total A for LB self care, mod A for UB self care, max A for squat pivot transfers, min - max A for seated balance   min - mod A overall.   pt/famuly edu, sitting balance, self care retraining, functional transfers/mobility   Mobility   min>max assist for sitting balance, mod/max assist for transfers, total assist for standing balance  mod assist overall  sitting balance, standing balance, transfers, progressing gait as able    Communication   Mod-Max A   Min A   naming, yes/no accuracy, self-monitoring and correcting verbal errors    Safety/Cognition/ Behavioral Observations  Max A  Min A  sustained attention, intellectual awareness, problem solving, attention to left  Pain   Patient takes fioricet PRN for headache.  goal is to reduce the pain level to 2/10  assess and treat pain while in therapy   Skin   no skin break down  no new break down while in rehab.  continue to monitor the skin every shift.    Rehab Goals Patient on target to meet rehab goals: Yes *See Care Plan and progress notes for long and short-term goals.  Barriers to Discharge: poor  appetite, flaccid L side, spasticity, HTN    Possible Resolutions to Barriers:  Optimize appetite meds, modalities, pt and family edu, optimize BP meds    Discharge Planning/Teaching Needs:  Plan to d/c home with husband and other family members providing 24/7 assistance.  Teaching to be scheduled.   Team Discussion:  Spasticity - adding meds; nausea may be r/t meds.  +1 toilet tfs today with PT but definitely fluctuates with abilities.  Does better with therapies if started late morning.  Aphasic and dysarthric and poor po.  Goals set for mod assist at w/c level.  Dense/flaccid left side.  May be able to upgrade.  Revisions to Treatment Plan:  None   Continued Need for Acute Rehabilitation Level of Care: The patient requires daily medical management by a physician with specialized training in physical medicine and rehabilitation for the following conditions: Daily direction of a multidisciplinary physical rehabilitation program to ensure safe treatment while eliciting the highest outcome that is of practical value to the patient.: Yes Daily medical management of patient stability for increased activity during participation in an intensive rehabilitation regime.: Yes Daily analysis of laboratory values and/or radiology reports with any subsequent need for medication adjustment of medical intervention for : Neurological problems;Blood pressure problems  Spencer Cardinal 04/16/2016, 9:45 AM

## 2016-04-16 NOTE — Progress Notes (Signed)
Occupational Therapy Session Note  Patient Details  Name: Abigail Campos MRN: [id number] Date of Birth: [birth year]  Today's Date: 04/16/2016 OT Individual Time: 1340-1405 OT Individual Time Calculation (min): 25 min   Short Term Goals: Week 1:  OT Short Term Goal 1 (Week 1): Pt will participate in 5 minutes of self care tasks with 2 breaks or less in order to increase alertness and endurance for functional tasks. OT Short Term Goal 2 (Week 1): Pt will visually track to the L to locate 3 items during OT intervention with min verbal cues. OT Short Term Goal 3 (Week 1): Pt will perform transfer with one person to toilet in order to decrease level of assist needed with functional transfers.   Skilled Therapeutic Interventions/Progress Updates:  Patient found supine in bed, asleep. Pt somewhat difficult to awake and arouse. Engaged pt in bed mobility with mod assist for initiation and physical assistance for Left side and trunk control. Pt sat EOB and rubbing stomach. Pt unable to follow simple one step command to look at therapist while seated EOB. Therapist asked pt if she needed to use the BR and her response "yes". Assisted pt back to side lying then to supine. Placed bed pan and patient grunting/groaning and slowly falling back asleep. At end of session, left pt supine in bed and on bed pan with all needs within reach, all 4 bed rails up, and bed alarm set.   Therapy Documentation Precautions:  Precautions Precautions: Fall Precaution Comments: left hemi, L inattention, impulsive Restrictions Weight Bearing Restrictions: No  See Function Navigator for Current Functional Status.  Therapy/Group: Individual Therapy  Chrys Racer , MS, OTR/L, CLT  04/16/2016, 3:24 PM

## 2016-04-16 NOTE — Progress Notes (Signed)
Physical Therapy Session Note  Patient Details  Name: Abigail Campos MRN: [id number] Date of Birth: 10/12/1971  Today's Date: 04/16/2016 PT Individual Time: 1400-1515 PT Individual Time Calculation (min): 75 min   Short Term Goals: Week 2:  PT Short Term Goal 1 (Week 2): Pt will maintain static sitting balance with close supervision. PT Short Term Goal 2 (Week 2): Pt will demonstrate dynamic sitting balance with mod assist PT Short Term Goal 3 (Week 2): Pt will tolerate supported standing during functional task x3 minutes PT Short Term Goal 4 (Week 2): Pt will amb 10' with LRAD  Skilled Therapeutic Interventions/Progress Updates:    Handoff from OT in room, pt on bedpan with successful BM.  Pt c/o HA but does not rate, agreeable to therapy session.  Pt demos decreased attention today which resulted in need for increased assist and verbal cues throughout session.  Rolling R/L for hygiene and donning of clean brief with mod assist and max multimodal cues for initiation of movement.  Supine>sit with max assist and multimodal cues with mod assist to maintain sitting balance at EOB.  Max assist +1 stand/pivot transfer from EOB>w/c with therapist blocking L knee and providing multimodal cues for step/pivot.  Pt engaged in hand hygiene at sink with verbal cues for turning on water and applying soap.  PT had to place pt's L hand in the sink for pt to attend to it for washing.  Standing frame x2 minutes focus on attention to task and recognition of colors from a field of 2.  Pt ultimately unable to demo selective attention to task in moderately distracting environment and taken to ortho gym for quiet environment.  Stand/pivot from w/c>therapy mat with max assist and pt sat edge of mat x10 minutes requiring supervision>max assist to maintain balance as pt occasionally throwing R hip forward off of mat.  Pt unable to attend to task in quiet environment today and becoming restless and unsafe due to  impulsivity so transfer back to w/c with max assist and positioned with QRB in place and taken to nurses station to sit up.    Therapy Documentation Precautions:  Precautions Precautions: Fall Precaution Comments: left hemi, L inattention, impulsive Restrictions Weight Bearing Restrictions: No   See Function Navigator for Current Functional Status.   Therapy/Group: Individual Therapy  [name] 04/16/2016, 3:36 PM

## 2016-04-16 NOTE — Progress Notes (Signed)
Speech Language Pathology Weekly Progress and Session Note  Patient Details  Name: Abigail Campos MRN: [id number] Date of Birth: 05/09/1971  Beginning of progress report period: April 09, 2016 End of progress report period: Apr 16, 2016  Today's Date: 04/16/2016 SLP Individual Time: 1105-1130 SLP Individual Time Calculation (min): 25 min  Short Term Goals: Week 1: SLP Short Term Goal 1 (Week 1): Patient will consume current diet with minimal overt s/s of aspiration with Mod A verbal cues for use of swallowing strategies.  SLP Short Term Goal 1 - Progress (Week 1): Met SLP Short Term Goal 2 (Week 1): Patient will demonstrate sustained attention to functional tasks for 5 minutes with Min A verbal cues for redirection.  SLP Short Term Goal 2 - Progress (Week 1): Not met SLP Short Term Goal 3 (Week 1): Patient will attend to left field of enviornment during functional tasks with Mod A verbal ces.  SLP Short Term Goal 3 - Progress (Week 1): Met SLP Short Term Goal 4 (Week 1): Patient will answer basic yes/no questions with 50% accuracy in regards to wants/needs with Mod A multimodal cues.  SLP Short Term Goal 4 - Progress (Week 1): Met SLP Short Term Goal 5 (Week 1): Patient will utilize speech intelligibility strategies at the word level with Max A verbal cues to achieve 50% intelligibility.,  SLP Short Term Goal 5 - Progress (Week 1): Met SLP Short Term Goal 6 (Week 1): Patient will name common objects with 50% accuracy with Max A multimodal cues.  SLP Short Term Goal 6 - Progress (Week 1): Met    New Short Term Goals: Week 2: SLP Short Term Goal 1 (Week 2): Patient will consume current diet with minimal overt s/s of aspiration with Min A verbal cues for use of swallowing strategies.  SLP Short Term Goal 2 (Week 2): Patient will demonstrate sustained attention to functional tasks for 5 minutes with Min A verbal cues for redirection.  SLP Short Term Goal 3 (Week 2): Patient will attend to  left field of enviornment during functional tasks with Min A verbal ces.  SLP Short Term Goal 4 (Week 2): Patient will answer basic yes/no questions with 50% accuracy in regards to wants/needs with Min A multimodal cues.  SLP Short Term Goal 5 (Week 2): Patient will utilize speech intelligibility strategies at the word level with Mod A verbal cues to achieve 50% intelligibility.,  SLP Short Term Goal 6 (Week 2): Patient will name common objects with 50% accuracy with Mod A multimodal cues.   Weekly Progress Updates: Patient has made functional and inconsistent gains and has met 5 of 6 STG's this reporting period. Currently, patient requires overall Mod A to complete functional and familiar tasks safely in regards to attention, recall, problem solving, attention to left field of environment and awareness. Patient also requires overall Mod A multimodal cues for auditory comprehension of basic information and Max A multimodal cues for verbal expression at the phrase level. Patient is currently consuming Dys. 2 textures with thin liquids without overt s/s of aspiration but requires Mod A multimodal cues for use of swallowing compensatory strategies.  Patient is consuming trials of Dys. 3 textures but is not ready for an upgrade due to impulsivity with impaired mastication.  Patient and family education is ongoing. Patient would benefit from continued skilled SLP intervention to maximize her cognitive and swallowing function and functional communication in order to maximize her overall functional independence.     Intensity: Minumum  of 1-2 x/day, 30 to 90 minutes Frequency: 3 to 5 out of 7 days Duration/Length of Stay: 21-24 days  Treatment/Interventions: Cognitive remediation/compensation;Cueing hierarchy;Dysphagia/aspiration precaution training;Functional tasks;Patient/family education;Therapeutic Activities;Speech/Language facilitation;Environmental controls;Internal/external aids   Daily  Session  Skilled Therapeutic Interventions: Skilled treatment session focused on dysphagia and speech goals. Upon arrival, patient was awake while upright in the wheelchair. SLP facilitated session by administering trials of Dys. 3 textures. Patient demonstrated prolonged and efficient mastication with trials and required intermittent Mod A verbal cues for patient to self-monitor and correct left buccal pocketing and to utilize small bites. Recommend continued trials with SLP. Patient appeared lethargic throughout the session and required Mod A verbal and tactile cues for sustained attention to task for ~2 minute intervals. Patient with limited verbal expression but demonstrated phonemic paraphasias and required Max A multimodal cues to self-monitor and correct. Patient handed off to OT. Continue with current plan of care.       Function:   Eating Eating   Modified Consistency Diet: Yes Eating Assist Level: More than reasonable amount of time;Helper scoops food on utensil;Supervision or verbal cues;Helper checks for pocketed food   Eating Set Up Assist For: Opening containers Helper Scoops Food on Utensil: Occasionally     Cognition Comprehension Comprehension assist level: Understands basic 50 - 74% of the time/ requires cueing 25 - 49% of the time  Expression   Expression assist level: Expresses basic 25 - 49% of the time/requires cueing 50 - 75% of the time. Uses single words/gestures.  Social Interaction Social Interaction assist level: Interacts appropriately 25 - 49% of time - Needs frequent redirection.  Problem Solving Problem solving assist level: Solves basic 25 - 49% of the time - needs direction more than half the time to initiate, plan or complete simple activities  Memory Memory assist level: Recognizes or recalls 25 - 49% of the time/requires cueing 50 - 75% of the time   Pain Patient reports discomfort   Therapy/Group: Individual Therapy  Karlisha Mathena 04/16/2016, 4:01  PM

## 2016-04-16 NOTE — Progress Notes (Signed)
[facility]IT[facility]     PROGRESS NOTE  Subjective/Complaints:  Patient lying in bed this morning. Her husband has questions about a repeat CT.  Patient did not sleep with left hand brace overnight.  ROS: Unable to obtain due to mental condition.  Objective: Vital Signs: Blood pressure 128/79, pulse 63, temperature 97.8 F (36.6 C), temperature source Axillary, resp. rate 17, SpO2 99 %. No results found.  Recent Labs  04/14/16 0435  WBC 7.8  HGB 12.2  HCT 38.4  PLT 258    Recent Labs  04/14/16 0435  NA 136  K 4.1  CL 102  GLUCOSE 100*  BUN 12  CREATININE 0.89  CALCIUM 9.4   CBG (last 3)  No results for input(s): GLUCAP in the last 72 hours.  Wt Readings from Last 3 Encounters:  04/06/16 73.6 kg (162 lb 4.1 oz)  01/22/16 74.9 kg (165 lb 2 oz)  12/06/15 73.483 kg (162 lb)    Physical Exam:  BP 128/79 mmHg  Pulse 63  Temp(Src) 97.8 F (36.6 C) (Axillary)  Resp 17  SpO2 99% Constitutional: She appears well-developed. Obese. NAD. Vital signs reviewed. HENT:  Normocephalic and atraumatic.  Eyes: Conjunctivae and EOM are normal.     Cardiovascular: Normal rate and regular rhythm.    Respiratory: Effort normal and breath sounds normal. No respiratory distress.   GI: Soft. Bowel sounds are normal. She exhibits no distension.  Musculoskeletal: She exhibits no edema or no tenderness.  Neurological: She is alert.  Inconsistently follows commands Expressive > receptive aphasia Dysarthria Left facial weakness Motor: Moving right upper and right lower extremity freely.  Not moving left side.  MAS 0/4 Elbow flexion In left upper extremity (previously 1/4) Skin: Skin is warm and dry.  Psychiatric:  Unable to assess    Assessment/Plan: 1. Functional deficits secondary to right basal ganglia hemorrhage which require 3+ hours per day of interdisciplinary therapy in a comprehensive inpatient rehab setting. Physiatrist is providing close team  supervision and 24 hour management of active medical problems listed below. Physiatrist and rehab team continue to assess barriers to discharge/monitor patient progress toward functional and medical goals.  Function:  Bathing Bathing position   Position: Other (comment) (sitting EOB for UB, LB in supine)  Bathing parts Body parts bathed by patient: Chest, Abdomen, Left arm, Front perineal area Body parts bathed by helper: Buttocks, Right upper leg, Left upper leg, Right lower leg, Left lower leg, Back, Right arm  Bathing assist Assist Level:  (mod A)      Upper Body Dressing/Undressing Upper body dressing   What is the patient wearing?: Pull over shirt/dress, Bra Bra - Perfomed by patient: Thread/unthread right bra strap Bra - Perfomed by helper: Thread/unthread left bra strap, Hook/unhook bra (pull down sports bra) Pull over shirt/dress - Perfomed by patient: Thread/unthread right sleeve, Put head through opening Pull over shirt/dress - Perfomed by helper: Thread/unthread left sleeve, Pull shirt over trunk        Upper body assist Assist Level:  (max A)      Lower Body Dressing/Undressing Lower body dressing   What is the patient wearing?: Pants, Non-skid slipper socks     Pants- Performed by patient: Thread/unthread right pants leg Pants- Performed by helper: Thread/unthread left pants leg, Pull pants up/down   Non-skid slipper socks- Performed by helper: Don/doff right sock, Don/doff left sock               TED Hose - Performed  by helper: Don/doff right TED hose, Don/doff left TED hose  Lower body assist Assist for lower body dressing:  (total A)      Toileting Toileting Toileting activity did not occur: No continent bowel/bladder event Toileting steps completed by patient: Performs perineal hygiene Toileting steps completed by helper: Adjust clothing prior to toileting, Adjust clothing after toileting Toileting Assistive Devices: Grab bar or rail  Toileting  assist Assist level:  (max assist)   Transfers Chair/bed transfer   Chair/bed transfer method: Stand pivot Chair/bed transfer assist level: Maximal assist (Pt 25 - 49%/lift and lower) Chair/bed transfer assistive device: Armrests     Locomotion Ambulation Ambulation activity did not occur: Safety/medical concerns   Max distance: 25 Assist level: Maximal assist (Pt 25 - 49%)   Wheelchair       Assist Level: Dependent (Pt equals 0%)  Cognition Comprehension Comprehension assist level: Understands basic 50 - 74% of the time/ requires cueing 25 - 49% of the time  Expression Expression assist level: Expresses basic 50 - 74% of the time/requires cueing 25 - 49% of the time. Needs to repeat parts of sentences.  Social Interaction Social Interaction assist level: Interacts appropriately 25 - 49% of time - Needs frequent redirection.  Problem Solving Problem solving assist level: Solves basic 25 - 49% of the time - needs direction more than half the time to initiate, plan or complete simple activities  Memory Memory assist level: Recognizes or recalls 25 - 49% of the time/requires cueing 50 - 75% of the time     Medical Problem List and Plan: 1.Left hemiplegia, aphasia, dysphagia secondary to right basal ganglia hemorrhage secondary to hypertensive crisis on 04/01/16.   Continue CIR  PRAFO for left lower extremity and splints for left upper extremity ordered, however 1 hand splint still pending   Fluoxetine 10 started on 5/1 2. DVT Prophylaxis/Anticoagulation: Subcutaneous heparin for DVT prophylaxis initiated 04/07/2016 3. Pain Management/migraine headaches: Fioricet for headaches and Robaxin. 4. Dysphagia. #2 thin liquid diet. Speech therapy follow-up. Will advance diet as tolerated  Will start Remeron on 5/4.  5. Neuropsych: This patient is not capable of making decisions on her own behalf. 6. Skin/Wound Care: Routine skin checks 7. Fluids/Electrolytes/Nutrition: Routine I&O   BMP  within normal range on 5/2 8. Hypertension. Plendil 10 mg daily, Bysystolic 20 mg every 12 hours, Hydralazine 10 mg 3 times a day. Monitor with increased mobility  lisinopril 20 mg twice a day, increased to 40 mg on 4/28 9. Hyperlipidemia. Lipitor 10. Spasticity: Controlled  Continue baclofen 5 mg on 4/27 11. Thrush:   Improving   Diflucan DC'd on 5/3 12. Obesity  There is no weight on file to calculate BMI., will obtain, still pending  Diet and exercise education  Will cont to encourage weight loss to increase endurance and promote overall health  LOS (Days) 8 A FACE TO FACE EVALUATION WAS PERFORMED  Ankit [name] 04/16/2016 9:11 AM

## 2016-04-17 ENCOUNTER — Inpatient Hospital Stay (HOSPITAL_COMMUNITY): Payer: Managed Care, Other (non HMO) | Admitting: Physical Therapy

## 2016-04-17 ENCOUNTER — Inpatient Hospital Stay (HOSPITAL_COMMUNITY): Payer: Managed Care, Other (non HMO) | Admitting: Occupational Therapy

## 2016-04-17 ENCOUNTER — Inpatient Hospital Stay (HOSPITAL_COMMUNITY): Payer: Managed Care, Other (non HMO) | Admitting: Speech Pathology

## 2016-04-17 NOTE — Progress Notes (Signed)
Occupational Therapy Session Note  Patient Details  Name: Abigail Campos MRN: [id number] Date of Birth: 26-Nov-1971  Today's Date: 04/17/2016 OT Individual Time:  - 1000-1100  (60 min)      Short Term Goals: Week 1:  OT Short Term Goal 1 (Week 1): Pt will participate in 5 minutes of self care tasks with 2 breaks or less in order to increase alertness and endurance for functional tasks. OT Short Term Goal 1 - Progress (Week 1): Progressing toward goal OT Short Term Goal 2 (Week 1): Pt will visually track to the L to locate 3 items during OT intervention with min verbal cues. OT Short Term Goal 2 - Progress (Week 1): Met OT Short Term Goal 3 (Week 1): Pt will perform transfer with one person to toilet in order to decrease level of assist needed with functional transfers.  OT Short Term Goal 3 - Progress (Week 1): Met Week 2:  OT Short Term Goal 1 (Week 2): Pt will participate in 3 minutes of self care task with 2 rest breaks or less in order to increase alertness and endurance for functional tasks.  OT Short Term Goal 2 (Week 2): Pt will perform UB dressing with mod A in order to decrease level of assist for self care. OT Short Term Goal 3 (Week 2): Pt will perform LB dressing with max A in order to decrease level of assist for self care.  OT Short Term Goal 4 (Week 2): Caregiver will assist pt with L UE self ROM HEP in order to ensure correct exercise performed with use of paper handout in order to demonstrate knowledge of proper technique.  Week 3:     Skilled Therapeutic Interventions/Progress Updates:   Pt lying in bed. Husband, [name] present.  Pt aroused with max assist.  Went from supine to sit with mod assist.  Transferred to wc with +2.  Ppt bathed at sink.  Went from h sit to stand to don pants and +2.  Pt donned shirt with mod assist and pants with total assist.  Addressed midline control. Crossing midline, LUE NMRE.  Ppt passed on to next therapy.    Precautions:   Precautions Precautions: Fall Precaution Comments: left hemi, L inattention, impulsive Restrictions Weight Bearing Restrictions: No    Vital Signs: Therapy Vitals Temp: 98 F (36.7 C) Temp Source: Oral Pulse Rate: (!) 54 Resp: 18 BP: 109/66 mmHg Patient Position (if appropriate): Lying Oxygen Therapy SpO2: 99 % O2 Device: Not Delivered Pain:  5/10 headache          See Function Navigator for Current Functional Status.   Therapy/Group: Individual Therapy  Lisa Roca 04/17/2016, 7:50 AM

## 2016-04-17 NOTE — Progress Notes (Signed)
Speech Language Pathology Daily Session Note  Patient Details  Name: Abigail Campos MRN: [id number] Date of Birth: 16-Jul-1971  Today's Date: 04/17/2016 SLP Individual Time: 1100-1200 SLP Individual Time Calculation (min): 60 min  Short Term Goals: Week 2: SLP Short Term Goal 1 (Week 2): Patient will consume current diet with minimal overt s/s of aspiration with Min A verbal cues for use of swallowing strategies.  SLP Short Term Goal 2 (Week 2): Patient will demonstrate sustained attention to functional tasks for 5 minutes with Min A verbal cues for redirection.  SLP Short Term Goal 3 (Week 2): Patient will attend to left field of enviornment during functional tasks with Min A verbal ces.  SLP Short Term Goal 4 (Week 2): Patient will answer basic yes/no questions with 50% accuracy in regards to wants/needs with Min A multimodal cues.  SLP Short Term Goal 5 (Week 2): Patient will utilize speech intelligibility strategies at the word level with Mod A verbal cues to achieve 50% intelligibility.,  SLP Short Term Goal 6 (Week 2): Patient will name common objects with 50% accuracy with Mod A multimodal cues.   Skilled Therapeutic Interventions: Skilled treatment session focused on cognitive-linguistic goals. Upon arrival, patient was asleep while upright in the chair but easily arousable. SLP facilitated session by providing Mod-Max A phonemic and sentence completion cues for word-finding during a verbal description task and to self-monitor and correct phonemic paraphasias and perseveration. Patient attended to task for ~30 minutes with Min A verbal cues for redirection. Patient began to moan and yell out "oh" while holding her stomach but was unable to verbalize she needed to use the bathroom. Patient was transferred to the bed with +2 assist for utilization of the bedpan and required Max-Total A multimodal cues for safety with task due to impulsivity.  Patient left supine in bed while on the bedpan  with all needs within reach, alarm on and NT aware. Continue with current plan of care.    Function:   Cognition Comprehension Comprehension assist level: Understands basic 50 - 74% of the time/ requires cueing 25 - 49% of the time  Expression   Expression assist level: Expresses basic 25 - 49% of the time/requires cueing 50 - 75% of the time. Uses single words/gestures.  Social Interaction Social Interaction assist level: Interacts appropriately 25 - 49% of time - Needs frequent redirection.  Problem Solving Problem solving assist level: Solves basic 50 - 74% of the time/requires cueing 25 - 49% of the time  Memory Memory assist level: Recognizes or recalls 25 - 49% of the time/requires cueing 50 - 75% of the time    Pain Pain Assessment Faces Pain Scale: No hurt  Therapy/Group: Individual Therapy  Abigail Campos 04/17/2016, 12:34 PM

## 2016-04-17 NOTE — Progress Notes (Signed)
Abigail Campos PHYSICAL MEDICINE & REHABILITATION     PROGRESS NOTE  Subjective/Complaints:  Patient seen lying in bed this morning. She is sleepy this morning. She does have her braces on.  ROS: Unable to obtain due to mental condition.  Objective: Vital Signs: Blood pressure 109/66, pulse 54, temperature 98 F (36.7 C), temperature source Oral, resp. rate 18, SpO2 99 %. No results found. No results for input(s): WBC, HGB, HCT, PLT in the last 72 hours. No results for input(s): NA, K, CL, GLUCOSE, BUN, CREATININE, CALCIUM in the last 72 hours.  Invalid input(s): CO CBG (last 3)  No results for input(s): GLUCAP in the last 72 hours.  Wt Readings from Last 3 Encounters:  04/06/16 73.6 kg (162 lb 4.1 oz)  01/22/16 74.9 kg (165 lb 2 oz)  12/06/15 73.483 kg (162 lb)    Physical Exam:  BP 109/66 mmHg  Pulse 54  Temp(Src) 98 F (36.7 C) (Oral)  Resp 18  SpO2 99% Constitutional: She appears well-developed. Obese. NAD. Vital signs reviewed. HENT:  Normocephalic and atraumatic.  Eyes: Conjunctivae and EOM are normal.     Cardiovascular: Normal rate and regular rhythm.    Respiratory: Effort normal and breath sounds normal. No respiratory distress.   GI: Soft. Bowel sounds are normal. She exhibits no distension.  Musculoskeletal: She exhibits no edema or no tenderness.  Neurological: She is alert.  Inconsistently follows commands Expressive > receptive aphasia Dysarthria Left facial weakness Motor: Moving right upper and right lower extremity freely.  Not moving left side.  MAS 0/4 Elbow flexion In left upper extremity (previously 1/4) Skin: Skin is warm and dry.  Psychiatric:  Unable to assess    Assessment/Plan: 1. Functional deficits secondary to right basal ganglia hemorrhage which require 3+ hours per day of interdisciplinary therapy in a comprehensive inpatient rehab setting. Physiatrist is providing close team supervision and 24 hour management of active medical  problems listed below. Physiatrist and rehab team continue to assess barriers to discharge/monitor patient progress toward functional and medical goals.  Function:  Bathing Bathing position   Position: Other (comment) (UB in wheelchair at sink, LB from commode)  Bathing parts Body parts bathed by patient: Chest, Abdomen, Left arm, Right upper leg, Left upper leg Body parts bathed by helper: Right arm, Buttocks, Right lower leg, Front perineal area, Left lower leg, Back  Bathing assist Assist Level: 2 helpers      Upper Body Dressing/Undressing Upper body dressing   What is the patient wearing?: Pull over shirt/dress, Bra Bra - Perfomed by patient: Thread/unthread right bra strap Bra - Perfomed by helper: Thread/unthread left bra strap, Hook/unhook bra (pull down sports bra) Pull over shirt/dress - Perfomed by patient: Thread/unthread right sleeve, Put head through opening Pull over shirt/dress - Perfomed by helper: Thread/unthread left sleeve, Pull shirt over trunk        Upper body assist Assist Level:  (max A)      Lower Body Dressing/Undressing Lower body dressing   What is the patient wearing?: Pants     Pants- Performed by patient: Thread/unthread right pants leg Pants- Performed by helper: Thread/unthread left pants leg, Pull pants up/down   Non-skid slipper socks- Performed by helper: Don/doff right sock, Don/doff left sock               TED Hose - Performed by helper: Don/doff right TED hose, Don/doff left TED hose  Lower body assist Assist for lower body dressing: 2 Helpers  Toileting Toileting Toileting activity did not occur: No continent bowel/bladder event Toileting steps completed by patient: Performs perineal hygiene Toileting steps completed by helper: Adjust clothing prior to toileting, Performs perineal hygiene, Adjust clothing after toileting Toileting Assistive Devices: Grab bar or rail  Toileting assist Assist level: Two helpers    Transfers Chair/bed transfer   Chair/bed transfer method: Stand pivot Chair/bed transfer assist level: Maximal assist (Pt 25 - 49%/lift and lower) Chair/bed transfer assistive device: Armrests     Locomotion Ambulation Ambulation activity did not occur: Safety/medical concerns   Max distance: 25 Assist level: Maximal assist (Pt 25 - 49%)   Wheelchair       Assist Level: Dependent (Pt equals 0%)  Cognition Comprehension Comprehension assist level: Understands basic 50 - 74% of the time/ requires cueing 25 - 49% of the time  Expression Expression assist level: Expresses basic 25 - 49% of the time/requires cueing 50 - 75% of the time. Uses single words/gestures.  Social Interaction Social Interaction assist level: Interacts appropriately 25 - 49% of time - Needs frequent redirection.  Problem Solving Problem solving assist level: Solves basic 25 - 49% of the time - needs direction more than half the time to initiate, plan or complete simple activities  Memory Memory assist level: Recognizes or recalls 25 - 49% of the time/requires cueing 50 - 75% of the time     Medical Problem List and Plan: 1.Left hemiplegia, aphasia, dysphagia secondary to right basal ganglia hemorrhage secondary to hypertensive crisis on 04/01/16.   Continue CIR  PRAFO for left lower extremity and splints for left upper extremity ordered, however 1 hand splint still pending   Fluoxetine 10 started on 5/1 2. DVT Prophylaxis/Anticoagulation: Subcutaneous heparin for DVT prophylaxis initiated 04/07/2016 3. Pain Management/migraine headaches: Fioricet for headaches and Robaxin. 4. Dysphagia. #2 thin liquid diet. Speech therapy follow-up. Will advance diet as tolerated  Remeron started on 5/4.  5. Neuropsych: This patient is not capable of making decisions on her own behalf. 6. Skin/Wound Care: Routine skin checks 7. Fluids/Electrolytes/Nutrition: Routine I&O   BMP within normal range on 5/2 8. Hypertension.  Plendil 10 mg daily, Bysystolic 20 mg every 12 hours, Hydralazine 10 mg 3 times a day. Monitor with increased mobility  lisinopril 20 mg twice a day, increased to 40 mg on 4/28 9. Hyperlipidemia. Lipitor 10. Spasticity: Controlled  Continue baclofen 5 mg on 4/27 11. Thrush:   Improving   Diflucan DC'd on 5/3 12. Obesity  There is no weight on file to calculate BMI., will obtain, still pending  Diet and exercise education  Will cont to encourage weight loss to increase endurance and promote overall health  LOS (Days) 9 A FACE TO FACE EVALUATION WAS PERFORMED  Abigail Campos [name] 04/17/2016 9:16 AM

## 2016-04-17 NOTE — Progress Notes (Signed)
Physical Therapy Session Note  Patient Details  Name: Abigail Campos MRN: [id number] Date of Birth: [birth year]  Today's Date: 04/17/2016 PT Individual Time: 1400-1515 PT Individual Time Calculation (min): 75 min   Short Term Goals: Week 2:  PT Short Term Goal 1 (Week 2): Pt will maintain static sitting balance with close supervision. PT Short Term Goal 2 (Week 2): Pt will demonstrate dynamic sitting balance with mod assist PT Short Term Goal 3 (Week 2): Pt will tolerate supported standing during functional task x3 minutes PT Short Term Goal 4 (Week 2): Pt will amb 10' with LRAD  Skilled Therapeutic Interventions/Progress Updates:    Pt received resting in bed and agreeable to therapy session, no c/o pain.  Supine>sit with min assist, pt pulled up on therapist's hand.  Squat/pivot to w/c on pt's R with min assist.  Pt positioned facing mat in therapy mat and engaged in forced weight bearing through LLE/LUE standing at edge of mat first statically, then engaged in functional activity.  Pt able to transition sit<>stand x2 with max assist of 1 person to facilitate weight bearing through LUE, block L knee, and provide tactile cues for hip extension.  Reaching across midline to L for increased weight bearing through LUE.  Pt performed self AAROM to LUE elbow flex/ext in horizontal plane imitating washing the therapy mat.  No active movement noted in LUE or LE today.  Pt discouraged by this, PT provided emotional support and encouragement.  Transfer from TIS to manual w/c with max assist for control of stand/pivot and +2 assist to scoot back in chair.  PT instructed pt in w/c propulsion with R hemi technique and pt propelled back to room with mod assist and max multimodal cues for attention to L side.  Pt required several short rest breaks with w/c propulsion.  Stand/pivot from w/c>recliner with max assist for pivot and L knee control.  Pt positioned upright in recliner with QRB in place and mother  present in the room.  Mother states she will call nursing staff if she needs to leave room so pt is not left unattended in the chair.    Therapy Documentation Precautions:  Precautions Precautions: Fall Precaution Comments: left hemi, L inattention, impulsive Restrictions Weight Bearing Restrictions: No   See Function Navigator for Current Functional Status.   Therapy/Group: Individual Therapy  [name] 04/17/2016, 3:46 PM

## 2016-04-18 ENCOUNTER — Inpatient Hospital Stay (HOSPITAL_COMMUNITY): Payer: Managed Care, Other (non HMO) | Admitting: Occupational Therapy

## 2016-04-18 ENCOUNTER — Inpatient Hospital Stay (HOSPITAL_COMMUNITY): Payer: Managed Care, Other (non HMO) | Admitting: Speech Pathology

## 2016-04-18 NOTE — Progress Notes (Signed)
Occupational Therapy Session Note  Patient Details  Name: Abigail Campos MRN: [id number] Date of Birth: 06/28/1971  Today's Date: 04/18/2016 OT Individual Time: 0915-1000 OT Individual Time Calculation (min): 45 min    Short Term Goals: Week 2:  OT Short Term Goal 1 (Week 2): Pt will participate in 3 minutes of self care task with 2 rest breaks or less in order to increase alertness and endurance for functional tasks.  OT Short Term Goal 2 (Week 2): Pt will perform UB dressing with mod A in order to decrease level of assist for self care. OT Short Term Goal 3 (Week 2): Pt will perform LB dressing with max A in order to decrease level of assist for self care.  OT Short Term Goal 4 (Week 2): Caregiver will assist pt with L UE self ROM HEP in order to ensure correct exercise performed with use of paper handout in order to demonstrate knowledge of proper technique.   Skilled Therapeutic Interventions/Progress Updates:    Upon entering the room, pt supine in bed with no c/o,signs, or symptoms of pain. Pt's husband present in room. Pt having difficulty arousing from sleep throughout session. Bathing and dressing completed prior to therapist arrival per husband's report as he says he assisted her from bed level. Pt requiring maximal encouragement to participate as she was attempting to pull covers over her head and return to sleep. Pt rolling into side lying position on flat bed and needing max A for supine >sit. Pt transferred into wheelchair with max A squat pivot transfer. Oral care performed with set up A utilizing brush with suction. Pt needing mod A for thoroughness. Pt propelling wheelchair with hemiplegic technique 100' with mod A towards day room. Attempted sit <>stand from wheelchair with therapist blocking L knee but pt having difficulty following verbal commands and attending to tasks with minimal distractions in room and needing max multimodal cues. Pt standing from wheelchair with max A  and standing for 1 minute with mod A for static balance. Pt returning to wheelchair and OT assisting pt back to room via wheelchair. Quick release belt donned and pt remaining in wheelchair with husband present in room.   Therapy Documentation Precautions:  Precautions Precautions: Fall Precaution Comments: left hemi, L inattention, impulsive Restrictions Weight Bearing Restrictions: No Vital Signs: Therapy Vitals Pulse Rate: (!) 51 BP: 104/67 mmHg Pain: Pain Assessment Pain Assessment: Faces Faces Pain Scale: Hurts even more Pain Type: Acute pain Pain Location: Head Pain Orientation: Right Pain Descriptors / Indicators: Headache Pain Frequency: Intermittent Pain Onset: On-going Pain Intervention(s): Medication (See eMAR)  See Function Navigator for Current Functional Status.   Therapy/Group: Individual Therapy  Phineas Semen 04/18/2016, 10:01 AM

## 2016-04-18 NOTE — Progress Notes (Addendum)
[place] PHYSICAL MEDICINE & REHABILITATION     PROGRESS NOTE  Subjective/Complaints:  Patient resting comfortably in bed this morning. Family members present also sleeping. She does not have her hand brace on again.  ROS: Unable to obtain due to mental condition.  Objective: Vital Signs: Blood pressure 104/67, pulse 51, temperature 98.6 F (37 C), temperature source Axillary, resp. rate 18, weight 66.5 kg (146 lb 9.7 oz), SpO2 97 %. No results found. No results for input(s): WBC, HGB, HCT, PLT in the last 72 hours. No results for input(s): NA, K, CL, GLUCOSE, BUN, CREATININE, CALCIUM in the last 72 hours.  Invalid input(s): CO CBG (last 3)  No results for input(s): GLUCAP in the last 72 hours.  Wt Readings from Last 3 Encounters:  04/17/16 66.5 kg (146 lb 9.7 oz)  04/06/16 73.6 kg (162 lb 4.1 oz)  01/22/16 74.9 kg (165 lb 2 oz)    Physical Exam:  BP 104/67 mmHg  Pulse 51  Temp(Src) 98.6 F (37 C) (Axillary)  Resp 18  Wt 66.5 kg (146 lb 9.7 oz)  SpO2 97% Constitutional: She appears well-developed. Obese. NAD. Vital signs reviewed. HENT:  Normocephalic and atraumatic.  Eyes: Conjunctivae and EOM are normal.     Cardiovascular: Normal rate and regular rhythm.    Respiratory: Effort normal and breath sounds normal. No respiratory distress.   GI: Soft. Bowel sounds are normal. She exhibits no distension.  Musculoskeletal: She exhibits no edema or no tenderness.  Neurological: She is alert.  Inconsistently follows commands Expressive > receptive aphasia Dysarthria Left facial weakness Motor: Moving right upper and right lower extremity freely.  Not moving left side.  MAS 0/4 Elbow flexion In left upper extremity (previously 1/4) Skin: Skin is warm and dry.  Psychiatric:  Unable to assess    Assessment/Plan: 1. Functional deficits secondary to right basal ganglia hemorrhage which require 3+ hours per day of interdisciplinary therapy in a comprehensive inpatient  rehab setting. Physiatrist is providing close team supervision and 24 hour management of active medical problems listed below. Physiatrist and rehab team continue to assess barriers to discharge/monitor patient progress toward functional and medical goals.  Function:  Bathing Bathing position   Position: Wheelchair/chair at sink  Bathing parts Body parts bathed by patient: Chest, Abdomen, Left arm, Right upper leg, Left upper leg Body parts bathed by helper: Right arm, Buttocks, Right lower leg, Front perineal area, Left lower leg, Back  Bathing assist Assist Level: 2 helpers      Upper Body Dressing/Undressing Upper body dressing   What is the patient wearing?: Pull over shirt/dress, Bra Bra - Perfomed by patient: Thread/unthread right bra strap Bra - Perfomed by helper: Thread/unthread left bra strap, Hook/unhook bra (pull down sports bra) Pull over shirt/dress - Perfomed by patient: Thread/unthread right sleeve, Put head through opening Pull over shirt/dress - Perfomed by helper: Thread/unthread left sleeve, Pull shirt over trunk        Upper body assist Assist Level: Touching or steadying assistance(Pt > 75%)      Lower Body Dressing/Undressing Lower body dressing   What is the patient wearing?: Pants, Non-skid slipper socks     Pants- Performed by patient: Thread/unthread right pants leg Pants- Performed by helper: Thread/unthread left pants leg, Pull pants up/down   Non-skid slipper socks- Performed by helper: Don/doff right sock, Don/doff left sock               TED Hose - Performed by helper: Don/doff right TED hose,  Don/doff left TED hose  Lower body assist Assist for lower body dressing: 2 Helpers      Toileting Toileting Toileting activity did not occur: N/A Toileting steps completed by patient: Performs perineal hygiene Toileting steps completed by helper: Adjust clothing prior to toileting, Performs perineal hygiene, Adjust clothing after  toileting Toileting Assistive Devices: Grab bar or rail  Toileting assist Assist level: Two helpers   Transfers Chair/bed transfer   Chair/bed transfer [place]: Stand pivot Chair/bed transfer assist level: Moderate assist (Pt 50 - 74%/lift or lower) Chair/bed transfer assistive device: Armrests     Locomotion Ambulation Ambulation activity did not occur: Safety/medical concerns   Max distance: 25 Assist level: Maximal assist (Pt 25 - 49%)   Wheelchair   Type: Manual Max wheelchair distance: 200 Assist Level: Moderate assistance (Pt 50 - 74%)  Cognition Comprehension Comprehension assist level: Understands basic 50 - 74% of the time/ requires cueing 25 - 49% of the time  Expression Expression assist level: Expresses basic 25 - 49% of the time/requires cueing 50 - 75% of the time. Uses single words/gestures.  Social Interaction Social Interaction assist level: Interacts appropriately 25 - 49% of time - Needs frequent redirection.  Problem Solving Problem solving assist level: Solves basic 50 - 74% of the time/requires cueing 25 - 49% of the time  Memory Memory assist level: Recognizes or recalls 25 - 49% of the time/requires cueing 50 - 75% of the time     Medical Problem List and Plan: 1.Left hemiplegia, aphasia, dysphagia secondary to right basal ganglia hemorrhage secondary to hypertensive crisis on 04/01/16.   Continue CIR  PRAFO for left lower extremity and splints for left upper extremity ordered, however 1 hand splint still pending   Fluoxetine 10 started on 5/1 2. DVT Prophylaxis/Anticoagulation: Subcutaneous heparin for DVT prophylaxis initiated 04/07/2016 3. Pain Management/migraine headaches: Fioricet for headaches and Robaxin. 4. Dysphagia. #2 thin liquid diet. Speech therapy follow-up. Will advance diet as tolerated  Remeron started on 5/4, Still appears relatively poor, however inconsistent reporting.  5. Neuropsych: This patient is not capable of making decisions on  her own behalf. 6. Skin/Wound Care: Routine skin checks 7. Fluids/Electrolytes/Nutrition: Routine I&O   BMP within normal range on 5/2 8. Hypertension. Plendil 10 mg daily, Bysystolic 20 mg every 12 hours, Hydralazine 10 mg 3 times a day.   Lisinopril 20 mg twice a day, increased to 40 mg on 4/28  Monitor, appears to be improving 9. Hyperlipidemia. Lipitor 10. Spasticity: Controlled  Continue baclofen 5 mg on 4/27 11. Thrush:   Treated with Diflucan, DC'd on 5/3  LOS (Days) 10 A FACE TO FACE EVALUATION WAS PERFORMED  Ankit [name] 04/18/2016 8:55 AM

## 2016-04-19 NOTE — Progress Notes (Signed)
[place] PHYSICAL MEDICINE & REHABILITATION     PROGRESS NOTE  Subjective/Complaints:  Patient seen sleeping comfortably in bed this morning. Her husband is at bedside.  ROS: Unable to obtain due to mental condition.  Objective: Vital Signs: Blood pressure 113/73, pulse 66, temperature 98.6 F (37 C), temperature source Oral, resp. rate 18, weight 66.5 kg (146 lb 9.7 oz), SpO2 99 %. No results found. No results for input(s): WBC, HGB, HCT, PLT in the last 72 hours. No results for input(s): NA, K, CL, GLUCOSE, BUN, CREATININE, CALCIUM in the last 72 hours.  Invalid input(s): CO CBG (last 3)  No results for input(s): GLUCAP in the last 72 hours.  Wt Readings from Last 3 Encounters:  04/17/16 66.5 kg (146 lb 9.7 oz)  04/06/16 73.6 kg (162 lb 4.1 oz)  01/22/16 74.9 kg (165 lb 2 oz)    Physical Exam:  BP 113/73 mmHg  Pulse 66  Temp(Src) 98.6 F (37 C) (Oral)  Resp 18  Wt 66.5 kg (146 lb 9.7 oz)  SpO2 99% Constitutional: She appears well-developed. Obese. NAD. Vital signs reviewed. HENT:  Normocephalic and atraumatic.  Eyes: Conjunctivae and EOM are normal.     Cardiovascular: Normal rate and regular rhythm.    Respiratory: Effort normal and breath sounds normal. No respiratory distress.   GI: Soft. Bowel sounds are normal. She exhibits no distension.  Musculoskeletal: She exhibits no edema or no tenderness.  Neurological: She is alert.  Inconsistently follows commands Expressive > receptive aphasia Dysarthria Left facial weakness Motor: Moving right upper and right lower extremity freely.  Not moving left side.  MAS 0/4 Elbow flexion In left upper extremity (previously 1/4) Skin: Skin is warm and dry.  Psychiatric:  Unable to assess   Assessment/Plan: 1. Functional deficits secondary to right basal ganglia hemorrhage which require 3+ hours per day of interdisciplinary therapy in a comprehensive inpatient rehab setting. Physiatrist is providing close team  supervision and 24 hour management of active medical problems listed below. Physiatrist and rehab team continue to assess barriers to discharge/monitor patient progress toward functional and medical goals.  Function:  Bathing Bathing position   Position: Wheelchair/chair at sink  Bathing parts Body parts bathed by patient: Chest, Abdomen, Left arm, Right upper leg, Left upper leg Body parts bathed by helper: Right arm, Buttocks, Right lower leg, Front perineal area, Left lower leg, Back  Bathing assist Assist Level: 2 helpers      Upper Body Dressing/Undressing Upper body dressing   What is the patient wearing?: Pull over shirt/dress, Bra Bra - Perfomed by patient: Thread/unthread right bra strap Bra - Perfomed by helper: Thread/unthread left bra strap, Hook/unhook bra (pull down sports bra) Pull over shirt/dress - Perfomed by patient: Thread/unthread right sleeve, Put head through opening Pull over shirt/dress - Perfomed by helper: Thread/unthread left sleeve, Pull shirt over trunk        Upper body assist Assist Level: Touching or steadying assistance(Pt > 75%)      Lower Body Dressing/Undressing Lower body dressing   What is the patient wearing?: Pants, Non-skid slipper socks     Pants- Performed by patient: Thread/unthread right pants leg Pants- Performed by helper: Thread/unthread left pants leg, Pull pants up/down   Non-skid slipper socks- Performed by helper: Don/doff right sock, Don/doff left sock               TED Hose - Performed by helper: Don/doff right TED hose, Don/doff left TED hose  Lower body assist Assist  for lower body dressing: 2 Helpers      Naval architect activity did not occur: No continent bowel/bladder event Toileting steps completed by patient: Performs perineal hygiene Toileting steps completed by helper: Adjust clothing prior to toileting, Performs perineal hygiene, Adjust clothing after toileting Toileting Assistive  Devices: Grab bar or rail  Toileting assist Assist level: Two helpers   Transfers Chair/bed transfer   Chair/bed transfer method: Stand pivot Chair/bed transfer assist level: Maximal assist (Pt 25 - 49%/lift and lower) Chair/bed transfer assistive device: Armrests     Locomotion Ambulation Ambulation activity did not occur: Safety/medical concerns   Max distance: 25 Assist level: Maximal assist (Pt 25 - 49%)   Wheelchair   Type: Manual Max wheelchair distance: 100 Assist Level: Moderate assistance (Pt 50 - 74%)  Cognition Comprehension Comprehension assist level: Understands basic 50 - 74% of the time/ requires cueing 25 - 49% of the time  Expression Expression assist level: Expresses basic 25 - 49% of the time/requires cueing 50 - 75% of the time. Uses single words/gestures.  Social Interaction Social Interaction assist level: Interacts appropriately 25 - 49% of time - Needs frequent redirection.  Problem Solving Problem solving assist level: Solves basic 50 - 74% of the time/requires cueing 25 - 49% of the time  Memory Memory assist level: Recognizes or recalls 25 - 49% of the time/requires cueing 50 - 75% of the time     Medical Problem List and Plan: 1.Left hemiplegia, aphasia, dysphagia secondary to right basal ganglia hemorrhage secondary to hypertensive crisis on 04/01/16.   Continue CIR  PRAFO for left lower extremity and splints for left upper extremity ordered, however 1 hand splint still pending, Will follow up tomorrow   Fluoxetine 10 started on 5/1, will plan further increase tomorrow 2. DVT Prophylaxis/Anticoagulation: Subcutaneous heparin for DVT prophylaxis initiated 04/07/2016 3. Pain Management/migraine headaches: Fioricet for headaches and Robaxin. 4. Dysphagia. #2 thin liquid diet. Speech therapy follow-up. Will advance diet as tolerated  Remeron started on 5/4, Still appears relatively poor, however inconsistent reporting.  5. Neuropsych: This patient is not  capable of making decisions on her own behalf. 6. Skin/Wound Care: Routine skin checks 7. Fluids/Electrolytes/Nutrition: Routine I&O   BMP within normal range on 5/2 8. Hypertension. Plendil 10 mg daily, Bysystolic 20 mg every 12 hours, Hydralazine 10 mg 3 times a day.   Lisinopril 20 mg twice a day, increased to 40 mg on 4/28  Monitor, will that 9. Hyperlipidemia. Lipitor 10. Spasticity: Controlled  Continue baclofen 5 mg on 4/27 11. Thrush:   Treated with Diflucan, DC'd on 5/3  LOS (Days) 11 A FACE TO FACE EVALUATION WAS PERFORMED  Ankit [name] 04/19/2016 7:34 AM

## 2016-04-20 ENCOUNTER — Inpatient Hospital Stay (HOSPITAL_COMMUNITY): Payer: Managed Care, Other (non HMO) | Admitting: Speech Pathology

## 2016-04-20 ENCOUNTER — Inpatient Hospital Stay (HOSPITAL_COMMUNITY): Payer: Managed Care, Other (non HMO) | Admitting: Occupational Therapy

## 2016-04-20 ENCOUNTER — Inpatient Hospital Stay (HOSPITAL_COMMUNITY): Payer: Managed Care, Other (non HMO) | Admitting: Physical Therapy

## 2016-04-20 MED ORDER — FLUOXETINE HCL 20 MG PO CAPS
20.0000 mg | ORAL_CAPSULE | Freq: Every day | ORAL | Status: DC
  Administered 2016-04-21 – 2016-05-08 (×18): 20 mg via ORAL
  Filled 2016-04-20 (×18): qty 1

## 2016-04-20 NOTE — Progress Notes (Signed)
Orthopedic Tech Progress Note Patient Details:  Abigail Campos Jun 12, 1971 [id number]  Ortho Devices Type of Ortho Device: Velcro wrist splint Ortho Device/Splint Location: lue Ortho Device/Splint Interventions: Application   Kati Riggenbach 04/20/2016, 1:44 PM

## 2016-04-20 NOTE — Progress Notes (Signed)
[place] PHYSICAL MEDICINE & REHABILITATION     PROGRESS NOTE  Subjective/Complaints:  Patient alert and awake in bed this morning. No family at bedside.  ROS: Unable to obtain due to mental condition.  Objective: Vital Signs: Blood pressure 100/65, pulse 58, temperature 98.1 F (36.7 C), temperature source Axillary, resp. rate 17, weight 66.5 kg (146 lb 9.7 oz), SpO2 97 %. No results found. No results for input(s): WBC, HGB, HCT, PLT in the last 72 hours. No results for input(s): NA, K, CL, GLUCOSE, BUN, CREATININE, CALCIUM in the last 72 hours.  Invalid input(s): CO CBG (last 3)  No results for input(s): GLUCAP in the last 72 hours.  Wt Readings from Last 3 Encounters:  04/17/16 66.5 kg (146 lb 9.7 oz)  04/06/16 73.6 kg (162 lb 4.1 oz)  01/22/16 74.9 kg (165 lb 2 oz)    Physical Exam:  BP 100/65 mmHg  Pulse 58  Temp(Src) 98.1 F (36.7 C) (Axillary)  Resp 17  Wt 66.5 kg (146 lb 9.7 oz)  SpO2 97% Constitutional: She appears well-developed. Obese. NAD. Vital signs reviewed. HENT:  Normocephalic and atraumatic.  Eyes: Conjunctivae and EOM are normal.     Cardiovascular: Normal rate and regular rhythm.    Respiratory: Effort normal and breath sounds normal. No respiratory distress.   GI: Soft. Bowel sounds are normal. She exhibits no distension.  Musculoskeletal: She exhibits no edema or no tenderness.  Neurological: She is alert.  Inconsistently follows commands Expressive > receptive aphasia, Perseverative  Dysarthria Left facial weakness Motor: Moving right upper and right lower extremity freely.  Not moving left side.  MAS 0/4 Elbow flexion In left upper extremity (previously 1/4) Skin: Skin is warm and dry.  Psychiatric:  Unable to assess   Assessment/Plan: 1. Functional deficits secondary to right basal ganglia hemorrhage which require 3+ hours per day of interdisciplinary therapy in a comprehensive inpatient rehab setting. Physiatrist is providing close  team supervision and 24 hour management of active medical problems listed below. Physiatrist and rehab team continue to assess barriers to discharge/monitor patient progress toward functional and medical goals.  Function:  Bathing Bathing position   Position: Wheelchair/chair at sink  Bathing parts Body parts bathed by patient: Chest, Abdomen, Left arm, Right upper leg, Left upper leg Body parts bathed by helper: Right arm, Buttocks, Right lower leg, Front perineal area, Left lower leg, Back  Bathing assist Assist Level: 2 helpers      Upper Body Dressing/Undressing Upper body dressing   What is the patient wearing?: Pull over shirt/dress, Bra Bra - Perfomed by patient: Thread/unthread right bra strap Bra - Perfomed by helper: Thread/unthread left bra strap, Hook/unhook bra (pull down sports bra) Pull over shirt/dress - Perfomed by patient: Thread/unthread right sleeve, Put head through opening Pull over shirt/dress - Perfomed by helper: Thread/unthread left sleeve, Pull shirt over trunk        Upper body assist Assist Level: Touching or steadying assistance(Pt > 75%)      Lower Body Dressing/Undressing Lower body dressing   What is the patient wearing?: Pants, Non-skid slipper socks     Pants- Performed by patient: Thread/unthread right pants leg Pants- Performed by helper: Thread/unthread left pants leg, Pull pants up/down   Non-skid slipper socks- Performed by helper: Don/doff right sock, Don/doff left sock               TED Hose - Performed by helper: Don/doff right TED hose, Don/doff left TED hose  Lower body assist  Assist for lower body dressing: 2 Helpers      Naval architect activity did not occur: No continent bowel/bladder event Toileting steps completed by patient: Performs perineal hygiene Toileting steps completed by helper: Adjust clothing prior to toileting, Performs perineal hygiene, Adjust clothing after toileting Toileting Assistive  Devices: Grab bar or rail  Toileting assist Assist level: Two helpers   Transfers Chair/bed transfer   Chair/bed transfer method: Stand pivot Chair/bed transfer assist level: Maximal assist (Pt 25 - 49%/lift and lower) Chair/bed transfer assistive device: Armrests     Locomotion Ambulation Ambulation activity did not occur: Safety/medical concerns   Max distance: 25 Assist level: Maximal assist (Pt 25 - 49%)   Wheelchair   Type: Manual Max wheelchair distance: 100 Assist Level: Moderate assistance (Pt 50 - 74%)  Cognition Comprehension Comprehension assist level: Understands basic 50 - 74% of the time/ requires cueing 25 - 49% of the time  Expression Expression assist level: Expresses basic 25 - 49% of the time/requires cueing 50 - 75% of the time. Uses single words/gestures.  Social Interaction Social Interaction assist level: Interacts appropriately 25 - 49% of time - Needs frequent redirection.  Problem Solving Problem solving assist level: Solves basic 50 - 74% of the time/requires cueing 25 - 49% of the time  Memory Memory assist level: Recognizes or recalls 25 - 49% of the time/requires cueing 50 - 75% of the time     Medical Problem List and Plan: 1.Left hemiplegia, aphasia, dysphagia secondary to right basal ganglia hemorrhage secondary to hypertensive crisis on 04/01/16.   Continue CIR  PRAFO for left lower extremity and splints for left upper extremity ordered, however 1 hand splint still pending  Fluoxetine 10 started on 5/1, Increased to 20 on 5/8 2. DVT Prophylaxis/Anticoagulation: Subcutaneous heparin for DVT prophylaxis initiated 04/07/2016 3. Pain Management/migraine headaches: Fioricet for headaches and Robaxin. 4. Dysphagia. #2 thin liquid diet. Speech therapy follow-up. Will advance diet as tolerated  Remeron started on 5/4, Will consider further increase in future.   Eating 0-25% of her meals.  5. Neuropsych: This patient is not capable of making decisions  on her own behalf. 6. Skin/Wound Care: Routine skin checks 7. Fluids/Electrolytes/Nutrition: Routine I&O   BMP within normal range on 5/2 8. Hypertension. Plendil 10 mg daily, Bysystolic 20 mg every 12 hours, Hydralazine 10 mg 3 times a day.   Lisinopril 20 mg twice a day, increased to 40 mg on 4/28  Monitor 9. Hyperlipidemia. Lipitor 10. Spasticity: Controlled  Continue baclofen 5 mg on 4/27 11. Thrush:   Treated with Diflucan, DC'd on 5/3  LOS (Days) 12 A FACE TO FACE EVALUATION WAS PERFORMED  Seirra Kos [name] 04/20/2016 9:22 AM

## 2016-04-20 NOTE — Progress Notes (Signed)
Speech Language Pathology Daily Session Notes  Patient Details  Name: Abigail Campos MRN: [id number] Date of Birth: 02-20-1971  Today's Date: 04/20/2016  Session 1: SLP Individual Time: 0930-1000 SLP Individual Time Calculation (min): 30 min   Session 2: SLP Individual Time: [id number] SLP Individual Time Calculation (min): 30 min  Short Term Goals: Week 2: SLP Short Term Goal 1 (Week 2): Patient will consume current diet with minimal overt s/s of aspiration with Min A verbal cues for use of swallowing strategies.  SLP Short Term Goal 2 (Week 2): Patient will demonstrate sustained attention to functional tasks for 5 minutes with Min A verbal cues for redirection.  SLP Short Term Goal 3 (Week 2): Patient will attend to left field of enviornment during functional tasks with Min A verbal ces.  SLP Short Term Goal 4 (Week 2): Patient will answer basic yes/no questions with 50% accuracy in regards to wants/needs with Min A multimodal cues.  SLP Short Term Goal 5 (Week 2): Patient will utilize speech intelligibility strategies at the word level with Mod A verbal cues to achieve 50% intelligibility.,  SLP Short Term Goal 6 (Week 2): Patient will name common objects with 50% accuracy with Mod A multimodal cues.   Skilled Therapeutic Interventions:  Session 1: Skilled treatment session focused on cognitive-linguistic goals. Upon arrival, patient was awake while appearing restless in bed. Patient was essentially nonverbal throughout the session with intermittent moaning while holding her stomach despite Max A multimodal cues. SLP facilitated session by providing extra time and Max A verbal and tactile cues to follow 1 step commands for bed mobility during self-care tasks. Patient was placed on the bedpan without success and required Max A verbal cues to attend to task and to keep her eyes open for ~60 second intervals. Patient left supine in bed with all needs within reach. Continue with current plan  of care.   Session 2: Skilled treatment session focused on dysphagia goals. Upon arrival, patient was tearful while sitting upright in the wheelchair at the RN station. Patient was essentially nonverbal throughout the session but verbalized "break" X 2 throughout the meal and "no" in response to clinician's questions but did not attempt to verbalize why she was tearful despite Max A multimodal cues. Patient consumed meal of Dys. 2 textures with thin liquids via straw without overt s/s of aspiration but required Mod A verbal cues for use of small bites and to attend to left anterior spillage. Recommend patient continue current diet. Patient left at RN station. Continue with current plan of care.   Function:  Eating Eating   Modified Consistency Diet: Yes Eating Assist Level: Supervision or verbal cues;Helper checks for pocketed food;Helper scoops food on utensil;Set up assist for   Eating Set Up Assist For: Opening containers Helper Scoops Food on Utensil: Occasionally Helper Brings Food to Mouth: Occasionally   Cognition Comprehension Comprehension assist level: Understands basic 50 - 74% of the time/ requires cueing 25 - 49% of the time  Expression   Expression assist level: Expresses basic 25 - 49% of the time/requires cueing 50 - 75% of the time. Uses single words/gestures.  Social Interaction Social Interaction assist level: Interacts appropriately 25 - 49% of time - Needs frequent redirection.  Problem Solving Problem solving assist level: Solves basic 50 - 74% of the time/requires cueing 25 - 49% of the time  Memory Memory assist level: Recognizes or recalls 25 - 49% of the time/requires cueing 50 - 75% of the time  Pain No reports of pain   Therapy/Group: Individual Therapy  Aveena Bari 04/20/2016, 11:50 AM

## 2016-04-20 NOTE — Progress Notes (Signed)
Occupational Therapy Session Note  Patient Details  Name: Abigail Campos MRN: [id number] Date of Birth: 04/22/1971  Today's Date: 04/20/2016 OT Individual Time: 1000-1100 OT Individual Time Calculation (min): 60 min    Short Term Goals: Week 2:  OT Short Term Goal 1 (Week 2): Pt will participate in 3 minutes of self care task with 2 rest breaks or less in order to increase alertness and endurance for functional tasks.  OT Short Term Goal 2 (Week 2): Pt will perform UB dressing with mod A in order to decrease level of assist for self care. OT Short Term Goal 3 (Week 2): Pt will perform LB dressing with max A in order to decrease level of assist for self care.  OT Short Term Goal 4 (Week 2): Caregiver will assist pt with L UE self ROM HEP in order to ensure correct exercise performed with use of paper handout in order to demonstrate knowledge of proper technique.   Skilled Therapeutic Interventions/Progress Updates:  Upon entering the room, pt supine in bed sleeping. Difficult to wake pt up as she appears to pull covers over head as well. Pt grabbing stomach and grimacing. Pt able to verbalize she needed to use bathroom. Supine >sit with max A to EOB. Max A squat pivot transfer into wheelchair. OT transferred pt via wheelchair into bathroom for max A stand pivot transfer wheelchair <> standard toilet with use of grab bar. Pt needing total A for clothing management and hygiene. Pt needing hands on steady assist while on toilet secondary to impulsivity and safety concerns. OT propelled pt via wheelchair to [facility] gym to controlled environment with decreased external distractions. Pt given cards 2-8 and asked to place into numerical order by smallest to largest number when given two cards to pick from for each selection. Pt able to perform with mod verbal questioning cues and increased time for task. Pt then able to sort cards into correct suites with min verbal questioning cues and increased time. Quick  release belt donned and arm tray placed on wheelchair. Call bell and all needed items within reach with pt left in room at end of session.   Therapy Documentation Precautions:  Precautions Precautions: Fall Precaution Comments: left hemi, L inattention, impulsive Restrictions Weight Bearing Restrictions: No Vital Signs: Therapy Vitals Pulse Rate: 61 BP: (!) 86/54 mmHg Patient Position (if appropriate): Lying Pain: Pain Assessment Pain Assessment: No/denies pain  See Function Navigator for Current Functional Status.   Therapy/Group: Individual Therapy  Phineas Semen 04/20/2016, 10:10 PM

## 2016-04-20 NOTE — Progress Notes (Signed)
Physical Therapy Session Note  Patient Details  Name: Abigail Campos MRN: [id number] Date of Birth: [birth year]  Today's Date: 04/20/2016 PT Individual Time: 7207-2182 PT Individual Time Calculation (min): 72 min   Short Term Goals: Week 2:  PT Short Term Goal 1 (Week 2): Pt will maintain static sitting balance with close supervision. PT Short Term Goal 2 (Week 2): Pt will demonstrate dynamic sitting balance with mod assist PT Short Term Goal 3 (Week 2): Pt will tolerate supported standing during functional task x3 minutes PT Short Term Goal 4 (Week 2): Pt will amb 10' with LRAD  Skilled Therapeutic Interventions/Progress Updates:    Pt received sleeping in bed, moderately difficult to arouse with noxious tactile stimulation and HOB elevated.  Pt with no c/o pain and agreeable to therapy with encouragement.  Supine>sit at EOB and squat/pivot to w/c on R with max assist 2/2 lethargy.  Pt propelled w/c with RUE only and mod assist for steering.  Pt taken outside for focus on attention to conversation in distracting environment.  Pt indicating that she needed to use restroom, however when PT rolled pt into public restroom she states clearly, "Girl, I do not need to use the bathroom!"  Pt agreeable to return to unit for gait training.  Gait training along rail in hallway with max assist for LLE advancement and facilitation of L knee stability and upright posture.  Pt returned to room at end of session and positioned upright in w/c with QRB in place and needs met.   Therapy Documentation Precautions:  Precautions Precautions: Fall Precaution Comments: left hemi, L inattention, impulsive Restrictions Weight Bearing Restrictions: No   See Function Navigator for Current Functional Status.   Therapy/Group: Individual Therapy  [name] 04/20/2016, 3:22 PM

## 2016-04-21 ENCOUNTER — Inpatient Hospital Stay (HOSPITAL_COMMUNITY): Payer: Managed Care, Other (non HMO) | Admitting: Physical Therapy

## 2016-04-21 ENCOUNTER — Inpatient Hospital Stay (HOSPITAL_COMMUNITY): Payer: Managed Care, Other (non HMO) | Admitting: Speech Pathology

## 2016-04-21 ENCOUNTER — Inpatient Hospital Stay (HOSPITAL_COMMUNITY): Payer: Managed Care, Other (non HMO) | Admitting: Occupational Therapy

## 2016-04-21 DIAGNOSIS — I952 Hypotension due to drugs: Secondary | ICD-10-CM | POA: Insufficient documentation

## 2016-04-21 MED ORDER — NEBIVOLOL HCL 10 MG PO TABS
5.0000 mg | ORAL_TABLET | Freq: Two times a day (BID) | ORAL | Status: DC
  Administered 2016-04-22 – 2016-05-08 (×25): 5 mg via ORAL
  Filled 2016-04-21 (×33): qty 1

## 2016-04-21 NOTE — Progress Notes (Signed)
[facility] PHYSICAL MEDICINE & REHABILITATION     PROGRESS NOTE  Subjective/Complaints:  Patient lying in bed this morning. Her husband is at bedside. He states that she is eating better, especially with that is brought from outside. She is also noted to have her second brace now..  ROS: Unable to obtain due to mental condition.  Objective: Vital Signs: Blood pressure 103/63, pulse 60, temperature 98.6 F (37 C), temperature source Oral, resp. rate 18, weight 66.5 kg (146 lb 9.7 oz), SpO2 99 %. No results found. No results for input(s): WBC, HGB, HCT, PLT in the last 72 hours. No results for input(s): NA, K, CL, GLUCOSE, BUN, CREATININE, CALCIUM in the last 72 hours.  Invalid input(s): CO CBG (last 3)  No results for input(s): GLUCAP in the last 72 hours.  Wt Readings from Last 3 Encounters:  04/17/16 66.5 kg (146 lb 9.7 oz)  04/06/16 73.6 kg (162 lb 4.1 oz)  01/22/16 74.9 kg (165 lb 2 oz)    Physical Exam:  BP 103/63 mmHg  Pulse 60  Temp(Src) 98.6 F (37 C) (Oral)  Resp 18  Wt 66.5 kg (146 lb 9.7 oz)  SpO2 99% Constitutional: She appears well-developed. Obese. NAD. Vital signs reviewed. HENT:  Normocephalic and atraumatic.  Eyes: Conjunctivae and EOM are normal.     Cardiovascular: Normal rate and regular rhythm.    Respiratory: Effort normal and breath sounds normal. No respiratory distress.   GI: Soft. Bowel sounds are normal. She exhibits no distension.  Musculoskeletal: She exhibits no edema or no tenderness.  Neurological: She is alert.  Inconsistently follows commands Expressive > receptive aphasia, Perseverative  Dysarthria Left facial weakness Motor: Moving right upper and right lower extremity freely.  Not moving left side.  MAS 0/4 Elbow flexion In left upper extremity (previously 1/4) Skin: Skin is warm and dry.  Psychiatric:  Unable to assess   Assessment/Plan: 1. Functional deficits secondary to right basal ganglia hemorrhage which require 3+ hours  per day of interdisciplinary therapy in a comprehensive inpatient rehab setting. Physiatrist is providing close team supervision and 24 hour management of active medical problems listed below. Physiatrist and rehab team continue to assess barriers to discharge/monitor patient progress toward functional and medical goals.  Function:  Bathing Bathing position   Position: Wheelchair/chair at sink  Bathing parts Body parts bathed by patient: Chest, Abdomen, Left arm, Right upper leg, Left upper leg Body parts bathed by helper: Right arm, Buttocks, Right lower leg, Front perineal area, Left lower leg, Back  Bathing assist Assist Level: 2 helpers      Upper Body Dressing/Undressing Upper body dressing   What is the patient wearing?: Pull over shirt/dress, Bra Bra - Perfomed by patient: Thread/unthread right bra strap Bra - Perfomed by helper: Thread/unthread left bra strap, Hook/unhook bra (pull down sports bra) Pull over shirt/dress - Perfomed by patient: Thread/unthread right sleeve, Put head through opening Pull over shirt/dress - Perfomed by helper: Thread/unthread left sleeve, Pull shirt over trunk        Upper body assist Assist Level: Touching or steadying assistance(Pt > 75%)      Lower Body Dressing/Undressing Lower body dressing   What is the patient wearing?: Pants, Non-skid slipper socks     Pants- Performed by patient: Thread/unthread right pants leg Pants- Performed by helper: Thread/unthread left pants leg, Pull pants up/down   Non-skid slipper socks- Performed by helper: Don/doff right sock, Don/doff left sock  TED Hose - Performed by helper: Don/doff right TED hose, Don/doff left TED hose  Lower body assist Assist for lower body dressing: 2 Helpers      Toileting Toileting Toileting activity did not occur: No continent bowel/bladder event Toileting steps completed by patient: Performs perineal hygiene Toileting steps completed by helper:  Adjust clothing prior to toileting, Performs perineal hygiene, Adjust clothing after toileting Toileting Assistive Devices: Grab bar or rail  Toileting assist Assist level:  (total a)   Transfers Chair/bed transfer   Chair/bed transfer method: Squat pivot Chair/bed transfer assist level: Maximal assist (Pt 25 - 49%/lift and lower) Chair/bed transfer assistive device: Armrests, Bedrails     Locomotion Ambulation Ambulation activity did not occur: Safety/medical concerns   Max distance: 25 Assist level: Maximal assist (Pt 25 - 49%)   Wheelchair   Type: Manual Max wheelchair distance: 100 Assist Level: Moderate assistance (Pt 50 - 74%)  Cognition Comprehension Comprehension assist level: Understands basic 50 - 74% of the time/ requires cueing 25 - 49% of the time  Expression Expression assist level: Expresses basic 25 - 49% of the time/requires cueing 50 - 75% of the time. Uses single words/gestures.  Social Interaction Social Interaction assist level: Interacts appropriately 25 - 49% of time - Needs frequent redirection.  Problem Solving Problem solving assist level: Solves basic 50 - 74% of the time/requires cueing 25 - 49% of the time  Memory Memory assist level: Recognizes or recalls 25 - 49% of the time/requires cueing 50 - 75% of the time     Medical Problem List and Plan: 1.Left hemiplegia, aphasia, dysphagia secondary to right basal ganglia hemorrhage secondary to hypertensive crisis on 04/01/16.   Continue CIR  PRAFO for left lower extremity and splints for left upper extremity ordered  Fluoxetine 10 started on 5/1, Increased to 20 on 5/8 2. DVT Prophylaxis/Anticoagulation: Subcutaneous heparin for DVT prophylaxis initiated 04/07/2016 3. Pain Management/migraine headaches: Fioricet for headaches and Robaxin. 4. Dysphagia. #2 thin liquid diet. Speech therapy follow-up. Will advance diet as tolerated  Remeron started on 5/4, Will consider further increase in future.    Eating 40-100% of her meals.  5. Neuropsych: This patient is not capable of making decisions on her own behalf. 6. Skin/Wound Care: Routine skin checks 7. Fluids/Electrolytes/Nutrition: Routine I&O   BMP within normal range on 5/2 8. Hypertension.   Plendil 10 mg daily, Hydralazine 10 mg 3 times a day.   Lisinopril 20 mg twice a day, increased to 40 mg on [id number]  Bysystolic 20 mg every 12 hours, decreased to 5 mg on 5/9  Monitor 9. Hyperlipidemia. Lipitor 10. Spasticity: Controlled  Continue baclofen 5 mg on 4/27 11. Thrush:   Treated with Diflucan, DC'd on 5/3  LOS (Days) 13 A FACE TO FACE EVALUATION WAS PERFORMED  Dhyan Noah [name] 04/21/2016 8:30 AM

## 2016-04-21 NOTE — Progress Notes (Signed)
Occupational Therapy Session Note  Patient Details  Name: Abigail Campos MRN: [id number] Date of Birth: Apr 22, 1971  Today's Date: 04/21/2016 OT Individual Time: 1000-1056 OT Individual Time Calculation (min): 56 min    Short Term Goals: Week 2:  OT Short Term Goal 1 (Week 2): Pt will participate in 3 minutes of self care task with 2 rest breaks or less in order to increase alertness and endurance for functional tasks.  OT Short Term Goal 2 (Week 2): Pt will perform UB dressing with mod A in order to decrease level of assist for self care. OT Short Term Goal 3 (Week 2): Pt will perform LB dressing with max A in order to decrease level of assist for self care.  OT Short Term Goal 4 (Week 2): Caregiver will assist pt with L UE self ROM HEP in order to ensure correct exercise performed with use of paper handout in order to demonstrate knowledge of proper technique.   Skilled Therapeutic Interventions/Progress Updates:    Upon entering the room, pt supine in bed with no c/o pain and alert. Pt applied lotion to B LEs with hand over hand technique in order to utilize L UE in functional task and bring awareness to arm. Pt performed supine >sit with min A to EOB. Pants donned while seated on EOB with total A for clothing management and balance when standing to don. Pt transferred from bed >wheelchair with max A squat pivot transfer with pt needing mod multimodal cues for sequencing, attending to task, and initiation. Once in wheelchair, OT propelled pt to dayroom.. Pt able to verbalize name, month, and season correctly. Other orientations asked with choice of two answers with pt being 50% accurate with answers. Pt's daughter arrived and pt requested to return to room for toileting. Max A stand pivot transfer to standard toilet with use of grab bar. Pt needing total A for clothing management and hygiene after voiding. Pt remained in wheelchair with quick release belt donned and daughter present in room.  Call bell and all needed items within reach.   Therapy Documentation Precautions:  Precautions Precautions: Fall Precaution Comments: left hemi, L inattention, impulsive Restrictions Weight Bearing Restrictions: No  Vital Signs: Therapy Vitals Temp: 98.4 F (36.9 C) Temp Source: Oral Pulse Rate: (!) 50 Resp: 18 BP: 102/63 mmHg Patient Position (if appropriate): Lying Oxygen Therapy SpO2: 99 % O2 Device: Not Delivered  See Function Navigator for Current Functional Status.   Therapy/Group: Individual Therapy  Phineas Semen 04/21/2016, 5:22 PM

## 2016-04-21 NOTE — Progress Notes (Signed)
Speech Language Pathology Daily Session Note  Patient Details  Name: Abigail Campos MRN: [id number] Date of Birth: January 26, 1971  Today's Date: 04/21/2016 SLP Individual Time: 1100-1155 SLP Individual Time Calculation (min): 55 min  Short Term Goals: Week 2: SLP Short Term Goal 1 (Week 2): Patient will consume current diet with minimal overt s/s of aspiration with Min A verbal cues for use of swallowing strategies.  SLP Short Term Goal 2 (Week 2): Patient will demonstrate sustained attention to functional tasks for 5 minutes with Min A verbal cues for redirection.  SLP Short Term Goal 3 (Week 2): Patient will attend to left field of enviornment during functional tasks with Min A verbal ces.  SLP Short Term Goal 4 (Week 2): Patient will answer basic yes/no questions with 50% accuracy in regards to wants/needs with Min A multimodal cues.  SLP Short Term Goal 5 (Week 2): Patient will utilize speech intelligibility strategies at the word level with Mod A verbal cues to achieve 50% intelligibility.,  SLP Short Term Goal 6 (Week 2): Patient will name common objects with 50% accuracy with Mod A multimodal cues.   Skilled Therapeutic Interventions: Skilled treatment session focused on speech and dysphagia goals. SLP facilitated session by providing Max A sentence completion and semantic cues for naming of functional items with 100% accuracy and to self-monitor and correct phonemic paraphasia and verbal perseveration. Patient also required Max A multimodal cues to identify the function of the item at the word and phrase level. SLP also facilitated session by providing Mod A verbal cues for patient to utilize small bites/sips and a slow rate of self-feeding and total A to self-monitor and left anterior spillage. Recommend patient continue current meal. Patient was transferred back to bed at end of session and left with family present. Continue with current plan of care.     Function:  Eating Eating    Modified Consistency Diet: Yes Eating Assist Level: Set up assist for;Supervision or verbal cues;Helper checks for pocketed food;Helper scoops food on utensil   Eating Set Up Assist For: Opening containers Helper [place] on Utensil: Occasionally Helper [place] to Mouth: Occasionally   Cognition Comprehension Comprehension assist level: Understands basic 50 - 74% of the time/ requires cueing 25 - 49% of the time  Expression   Expression assist level: Expresses basic 25 - 49% of the time/requires cueing 50 - 75% of the time. Uses single words/gestures.  Social Interaction Social Interaction assist level: Interacts appropriately 25 - 49% of time - Needs frequent redirection.  Problem Solving Problem solving assist level: Solves basic 25 - 49% of the time - needs direction more than half the time to initiate, plan or complete simple activities  Memory Memory assist level: Recognizes or recalls 25 - 49% of the time/requires cueing 50 - 75% of the time    Pain No/Denies Pain   Therapy/Group: Individual Therapy  Niobe Dick 04/21/2016, 3:31 PM

## 2016-04-21 NOTE — Progress Notes (Signed)
Physical Therapy Weekly Progress Note  Patient Details  Name: Abigail Campos MRN: [id number] Date of Birth: [birth year]  Beginning of progress report period: Apr 15, 2016 End of progress report period: Apr 21, 2016  Today's Date: 04/21/2016 PT Individual Time: 1300-1415 PT Individual Time Calculation (min): 75 min   Patient has met 2 of 4 short term goals.  Pt continues to make slow progress with communication, attention, and awareness of L environment but is limited in mobility by significant deficits in safety awareness and lack of muscle activation on L side.   Patient continues to demonstrate the following deficits: muscle activation, muscle imbalance, abnormal tone, awareness of deficits, inattention, and strength and therefore will continue to benefit from skilled PT intervention to enhance overall performance with activity tolerance, balance, postural control, ability to compensate for deficits, functional use of  left upper extremity and left lower extremity, attention, awareness and coordination.  Patient progressing toward long term goals..  Continue plan of care.  PT Short Term Goals Week 2:  PT Short Term Goal 1 (Week 2): Pt will maintain static sitting balance with close supervision. PT Short Term Goal 1 - Progress (Week 2): Partly met PT Short Term Goal 2 (Week 2): Pt will demonstrate dynamic sitting balance with mod assist PT Short Term Goal 2 - Progress (Week 2): Not met PT Short Term Goal 3 (Week 2): Pt will tolerate supported standing during functional task x3 minutes PT Short Term Goal 3 - Progress (Week 2): Met PT Short Term Goal 4 (Week 2): Pt will amb 10' with LRAD PT Short Term Goal 4 - Progress (Week 2): Met Week 3:  PT Short Term Goal 1 (Week 3): Pt will demonstrate static sitting balance x10 minutes with supervision PT Short Term Goal 2 (Week 3): Pt will demonstrate transfers with mod assist in 3/4 observed trials PT Short Term Goal 3 (Week 3): Pt will propel  w/c x150' with supervision PT Short Term Goal 4 (Week 3): Pt will demonstrate static standing with min assist   Skilled Therapeutic Interventions/Progress Updates:    Pt received sleeping in bed but easily arouses to voice, no c/o pain, agreeable to therapy session.   Pt performs squat/pivot transfers throughout session with max assist and verbal cues for use of RLE to power up to squat or standing position.  Pt performs sit<>stand x3 throughout session with mod/max assist with therapist facilitating hip extension and blocking L knee.  Pt demonstrates poor safety awareness and impulsivity with transfers and requires max multimodal cues for attention to task.   NMR for forced use of LUE and weightbearing through LLE in standing at therapy mat.  Attempted transition to tall kneeling but pt unable to balance on R knee for therapist to bring LLE onto mat.  Tactile cues and facilitation for hip and knee extension while pt in standing with weight bearing through B elbows on bench placed on mat in front of pt.    Switched out w/c for hemi-height for increased independence with mobility.  Pt able to propel w/c using R hemi technique with initial mod assist, fade to min assist with therapist providing visual and verbal cues from the front/left for attention to L visual field and maintaining straight line navigation.    Pt returned to bed at end of session with max assist for w/c>bed transfer and for sit>supine.  Pt able to scoot self up in bed and left with call bell in reach and needs met.   Therapy  Documentation Precautions:  Precautions Precautions: Fall Precaution Comments: left hemi, L inattention, impulsive Restrictions Weight Bearing Restrictions: No   See Function Navigator for Current Functional Status.  Therapy/Group: Individual Therapy  [name] 04/21/2016, 4:29 PM

## 2016-04-22 ENCOUNTER — Inpatient Hospital Stay (HOSPITAL_COMMUNITY): Payer: Managed Care, Other (non HMO) | Admitting: Occupational Therapy

## 2016-04-22 ENCOUNTER — Inpatient Hospital Stay (HOSPITAL_COMMUNITY): Payer: Managed Care, Other (non HMO) | Admitting: Speech Pathology

## 2016-04-22 ENCOUNTER — Inpatient Hospital Stay (HOSPITAL_COMMUNITY): Payer: Managed Care, Other (non HMO) | Admitting: Physical Therapy

## 2016-04-22 DIAGNOSIS — T50905A Adverse effect of unspecified drugs, medicaments and biological substances, initial encounter: Secondary | ICD-10-CM

## 2016-04-22 DIAGNOSIS — R001 Bradycardia, unspecified: Secondary | ICD-10-CM | POA: Insufficient documentation

## 2016-04-22 LAB — URINALYSIS, ROUTINE W REFLEX MICROSCOPIC
Bilirubin Urine: NEGATIVE
Glucose, UA: NEGATIVE mg/dL
Hgb urine dipstick: NEGATIVE
Ketones, ur: NEGATIVE mg/dL
Leukocytes, UA: NEGATIVE
Nitrite: NEGATIVE
Protein, ur: NEGATIVE mg/dL
Specific Gravity, Urine: 1.025 (ref 1.005–1.030)
pH: 6 (ref 5.0–8.0)

## 2016-04-22 NOTE — Progress Notes (Signed)
Speech Language Pathology Daily Session Note  Patient Details  Name: Abigail Campos MRN: [id number] Date of Birth: [birth year]  Today's Date: 04/22/2016 SLP Individual Time: 0903-0930 SLP Individual Time Calculation (min): 27 min  Short Term Goals: Week 2: SLP Short Term Goal 1 (Week 2): Patient will consume current diet with minimal overt s/s of aspiration with Min A verbal cues for use of swallowing strategies.  SLP Short Term Goal 2 (Week 2): Patient will demonstrate sustained attention to functional tasks for 5 minutes with Min A verbal cues for redirection.  SLP Short Term Goal 3 (Week 2): Patient will attend to left field of enviornment during functional tasks with Min A verbal ces.  SLP Short Term Goal 4 (Week 2): Patient will answer basic yes/no questions with 50% accuracy in regards to wants/needs with Min A multimodal cues.  SLP Short Term Goal 5 (Week 2): Patient will utilize speech intelligibility strategies at the word level with Mod A verbal cues to achieve 50% intelligibility.,  SLP Short Term Goal 6 (Week 2): Patient will name common objects with 50% accuracy with Mod A multimodal cues.   Skilled Therapeutic Interventions: Skilled treatment session focused on addressing language goals. SLP facilitated session by providing Mod assist multimodal cues to achieve 50% accuracy with yes/no questions.  Patient appeared to decline by lowering head of bed, turning head away, and closing eyes.  Given a brief rest break and encouragement patient wiling to participate in a structured naming task.   Patient named pictures of common objects with Min assist sentence completion and phonemic cues.  Continue with current plan of care.   Pain Pain Assessment Pain Assessment: No/denies pain  Therapy/Group: Individual Therapy  Carmelia Roller., [place] [id number]  Wanda 04/22/2016, 9:19 AM

## 2016-04-22 NOTE — Progress Notes (Signed)
[facility] PHYSICAL MEDICINE & REHABILITATION     PROGRESS NOTE  Subjective/Complaints:  Patient Seen lying in bed this morning. She is sleepy this morning.  ROS: Unable to obtain due to mental condition.  Objective: Vital Signs: Blood pressure 119/72, pulse 53, temperature 98.1 F (36.7 C), temperature source Oral, resp. rate 18, weight 67.1 kg (147 lb 14.9 oz), SpO2 98 %. No results found. No results for input(s): WBC, HGB, HCT, PLT in the last 72 hours. No results for input(s): NA, K, CL, GLUCOSE, BUN, CREATININE, CALCIUM in the last 72 hours.  Invalid input(s): CO CBG (last 3)  No results for input(s): GLUCAP in the last 72 hours.  Wt Readings from Last 3 Encounters:  04/22/16 67.1 kg (147 lb 14.9 oz)  04/06/16 73.6 kg (162 lb 4.1 oz)  01/22/16 74.9 kg (165 lb 2 oz)    Physical Exam:  BP 119/72 mmHg  Pulse 53  Temp(Src) 98.1 F (36.7 C) (Oral)  Resp 18  Wt 67.1 kg (147 lb 14.9 oz)  SpO2 98% Constitutional: She appears well-developed. Obese. NAD. Vital signs reviewed. HENT:  Normocephalic and atraumatic.  Eyes: Conjunctivae and EOM are normal.     Cardiovascular: Normal rate and regular rhythm.    Respiratory: Effort normal and breath sounds normal. No respiratory distress.   GI: Soft. Bowel sounds are normal. She exhibits no distension.  Musculoskeletal: She exhibits no edema or no tenderness.  Neurological: She is alert.  Inconsistently follows commands Expressive > receptive aphasia, Perseverative  Dysarthria Left facial weakness Motor: Moving right upper and right lower extremity freely.  Not moving left side.  MAS 0/4 Elbow flexion In left upper extremity (previously 1/4) Skin: Skin is warm and dry.  Psychiatric:  Unable to assess   Assessment/Plan: 1. Functional deficits secondary to right basal ganglia hemorrhage which require 3+ hours per day of interdisciplinary therapy in a comprehensive inpatient rehab setting. Physiatrist is providing close team  supervision and 24 hour management of active medical problems listed below. Physiatrist and rehab team continue to assess barriers to discharge/monitor patient progress toward functional and medical goals.  Function:  Bathing Bathing position   Position: Wheelchair/chair at sink  Bathing parts Body parts bathed by patient: Chest, Abdomen, Left arm, Right upper leg, Left upper leg Body parts bathed by helper: Right arm, Buttocks, Right lower leg, Front perineal area, Left lower leg, Back  Bathing assist Assist Level: 2 helpers      Upper Body Dressing/Undressing Upper body dressing   What is the patient wearing?: Pull over shirt/dress, Bra Bra - Perfomed by patient: Thread/unthread right bra strap Bra - Perfomed by helper: Thread/unthread left bra strap, Hook/unhook bra (pull down sports bra) Pull over shirt/dress - Perfomed by patient: Thread/unthread right sleeve, Put head through opening Pull over shirt/dress - Perfomed by helper: Thread/unthread left sleeve, Pull shirt over trunk        Upper body assist Assist Level: Touching or steadying assistance(Pt > 75%)      Lower Body Dressing/Undressing Lower body dressing   What is the patient wearing?: Pants, Non-skid slipper socks     Pants- Performed by patient: Thread/unthread right pants leg Pants- Performed by helper: Thread/unthread left pants leg, Pull pants up/down   Non-skid slipper socks- Performed by helper: Don/doff right sock, Don/doff left sock               TED Hose - Performed by helper: Don/doff right TED hose, Don/doff left TED hose  Lower body assist  Assist for lower body dressing:  (total A)      Toileting Toileting Toileting activity did not occur: No continent bowel/bladder event Toileting steps completed by patient: Performs perineal hygiene Toileting steps completed by helper: Adjust clothing prior to toileting, Performs perineal hygiene, Adjust clothing after toileting Toileting Assistive  Devices: Grab bar or rail  Toileting assist Assist level:  (total a)   Transfers Chair/bed transfer   Chair/bed transfer method: Squat pivot Chair/bed transfer assist level: Maximal assist (Pt 25 - 49%/lift and lower) Chair/bed transfer assistive device: Armrests     Locomotion Ambulation Ambulation activity did not occur: Safety/medical concerns   Max distance: 25 Assist level: Maximal assist (Pt 25 - 49%)   Wheelchair   Type: Manual Max wheelchair distance: 150 Assist Level: Touching or steadying assistance (Pt > 75%)  Cognition Comprehension Comprehension assist level: Understands basic 50 - 74% of the time/ requires cueing 25 - 49% of the time  Expression Expression assist level: Expresses basic 25 - 49% of the time/requires cueing 50 - 75% of the time. Uses single words/gestures.  Social Interaction Social Interaction assist level: Interacts appropriately 25 - 49% of time - Needs frequent redirection.  Problem Solving Problem solving assist level: Solves basic 25 - 49% of the time - needs direction more than half the time to initiate, plan or complete simple activities  Memory Memory assist level: Recognizes or recalls 25 - 49% of the time/requires cueing 50 - 75% of the time     Medical Problem List and Plan: 1.Left hemiplegia, aphasia, dysphagia secondary to right basal ganglia hemorrhage secondary to hypertensive crisis on 04/01/16.   Continue CIR  PRAFO for left lower extremity and splints for left upper extremity ordered  Fluoxetine 10 started on 5/1, Increased to 20 on 5/8 2. DVT Prophylaxis/Anticoagulation: Subcutaneous heparin for DVT prophylaxis initiated 04/07/2016 3. Pain Management/migraine headaches: Fioricet for headaches and Robaxin. 4. Dysphagia. #2 thin liquid diet. Speech therapy follow-up. Will advance diet as tolerated  Remeron started on 5/4, Will consider further increase in future.   Eating 50-75% of her meals, overall improving.  5. Neuropsych: This  patient is not capable of making decisions on her own behalf. 6. Skin/Wound Care: Routine skin checks 7. Fluids/Electrolytes/Nutrition: Routine I&O   BMP within normal range on 5/2 8. Hypertension.   Plendil 10 mg daily  Hydralazine 10 mg 3 times a day, DC'd on 5/10.   Lisinopril 20 mg twice a day, increased to 40 mg on [id number]  Bysystolic 20 mg every 12 hours, decreased to 5 mg on 5/9  Monitor 9. Hyperlipidemia. Lipitor 10. Spasticity: Controlled  Continue baclofen 5 mg on 4/27 11. Thrush:   Treated with Diflucan, DC'd on 5/3  LOS (Days) 14 A FACE TO FACE EVALUATION WAS PERFORMED  Ankit [name] 04/22/2016 9:04 AM

## 2016-04-22 NOTE — Progress Notes (Signed)
Speech Language Pathology Daily Session Note  Patient Details  Name: Abigail Campos MRN: [id number] Date of Birth: April 10, 1971  Today's Date: 04/22/2016 SLP Individual Time: 1100-1145 SLP Individual Time Calculation (min): 45 min  Short Term Goals: Week 2: SLP Short Term Goal 1 (Week 2): Patient will consume current diet with minimal overt s/s of aspiration with Min A verbal cues for use of swallowing strategies.  SLP Short Term Goal 2 (Week 2): Patient will demonstrate sustained attention to functional tasks for 5 minutes with Min A verbal cues for redirection.  SLP Short Term Goal 3 (Week 2): Patient will attend to left field of enviornment during functional tasks with Min A verbal ces.  SLP Short Term Goal 4 (Week 2): Patient will answer basic yes/no questions with 50% accuracy in regards to wants/needs with Min A multimodal cues.  SLP Short Term Goal 5 (Week 2): Patient will utilize speech intelligibility strategies at the word level with Mod A verbal cues to achieve 50% intelligibility.,  SLP Short Term Goal 6 (Week 2): Patient will name common objects with 50% accuracy with Mod A multimodal cues.   Skilled Therapeutic Interventions: Skilled treatment session focused on functional communication and dysphagia goals. Upon arrival, patient was supine in bed but easily aroused to voice. SLP facilitated session by providing Max A multimodal cues for patient to answer basic yes/no questions in regards to wants/needs. Patient verbally responded in ~20% of opportunities, suspect fatigue impacts function but also suspect there is a behavioral component. Patient consumed her lunch meal of Dys. 2 textures with thin liquids without overt s/s of aspiration and required Mod A verbal cues for use of small bites and to self-monitor and correct left pocketing and anterior spillage. Patient missed remaining 15 minutes of session due keeping her eyes closed and not responding to clinician. RN made aware.  Patient left supine in bed with alarm on and all needs within reach. Continue with current plan of care.    Function:  Eating Eating   Modified Consistency Diet: Yes Eating Assist Level: Set up assist for;Supervision or verbal cues;Helper checks for pocketed food   Eating Set Up Assist For: Opening containers       Cognition Comprehension Comprehension assist level: Understands basic 50 - 74% of the time/ requires cueing 25 - 49% of the time  Expression   Expression assist level: Expresses basic 25 - 49% of the time/requires cueing 50 - 75% of the time. Uses single words/gestures.  Social Interaction Social Interaction assist level: Interacts appropriately 25 - 49% of time - Needs frequent redirection.  Problem Solving Problem solving assist level: Solves basic 25 - 49% of the time - needs direction more than half the time to initiate, plan or complete simple activities  Memory Memory assist level: Recognizes or recalls 25 - 49% of the time/requires cueing 50 - 75% of the time    Pain Pain Assessment Pain Assessment: No/denies pain Faces Pain Scale: Hurts even more Pain Type: Acute pain Pain Location: Head Pain Orientation: Right;Anterior Pain Descriptors / Indicators: Headache Pain Onset: On-going Patients Stated Pain Goal: 2 Pain Intervention(s): Medication (See eMAR) Multiple Pain Sites: No  Therapy/Group: Individual Therapy  Christelle Igoe 04/22/2016, 12:55 PM

## 2016-04-22 NOTE — Progress Notes (Signed)
Physical Therapy Session Note  Patient Details  Name: Abigail Campos MRN: [id number] Date of Birth: 02/14/1971  Today's Date: 04/22/2016 PT Individual Time: 1300-1415 PT Individual Time Calculation (min): 75 min   Short Term Goals: Week 3:  PT Short Term Goal 1 (Week 3): Pt will demonstrate static sitting balance x10 minutes with supervision PT Short Term Goal 2 (Week 3): Pt will demonstrate transfers with mod assist in 3/4 observed trials PT Short Term Goal 3 (Week 3): Pt will propel w/c x150' with supervision PT Short Term Goal 4 (Week 3): Pt will demonstrate static standing with min assist   Skilled Therapeutic Interventions/Progress Updates:    Pt received sleeping in bed, arouses to touch, no c/o pain and agreeable to therapy session.  Supine<>sit with mod assist to elevate trunk with increased time to come to EOB and to lift LEs back into bed at end of session.    Pt performs squat/pivot bed<>w/c throughout session with max assist.  Sit<>stand x2 during session with mod assist. Pt demos poor LLE activation and requires total assist to block knee and facilitate hip extension.  Pt engaged in sorting activity while weight bearing through LLE in standing and LUE on therapy mat from a field of 3.  Pt sitting suddenly in chair requiring assist to control descent.  When PT asked if pt were ready to try again pt lifting RLE off of ground, tilting pelvis posteriorly and sliding out of chair requiring +2 total assist to return to chair safely.  Vitals assessed to check for orthostasis and WNL.  Attempted to redirect pt in new activity from seated in w/c reaching forward for sorting task with therapist facilitating weight bearing through pt's L knee and L elbow, but pt repeatedly scooting forward in her chair.  Pt largely non-verbal with therapist throughout session, not answering any questions.  Pt returned to room at end of session and positioned supine in bed where she promptly pulled covers over  her head.  Husband at bedside.  Call bell in reach and needs met. Missed 15 minutes of skilled therapy.  Therapy Documentation Precautions:  Precautions Precautions: Fall Precaution Comments: left hemi, L inattention, impulsive Restrictions Weight Bearing Restrictions: No General: PT Amount of Missed Time (min): 15 Minutes PT Missed Treatment Reason: Patient unwilling to participate   See Function Navigator for Current Functional Status.   Therapy/Group: Individual Therapy  [name] 04/22/2016, 4:28 PM

## 2016-04-22 NOTE — Progress Notes (Signed)
Physical Therapy Session Note  Patient Details  Name: Abigail Campos MRN: [id number] Date of Birth: 1971-10-30  Today's Date: 04/22/2016 PT Individual Time: 1530-1610 PT Individual Time Calculation (min): 40 min Makeup Session   Short Term Goals: Week 3:  PT Short Term Goal 1 (Week 3): Pt will demonstrate static sitting balance x10 minutes with supervision PT Short Term Goal 2 (Week 3): Pt will demonstrate transfers with mod assist in 3/4 observed trials PT Short Term Goal 3 (Week 3): Pt will propel w/c x150' with supervision PT Short Term Goal 4 (Week 3): Pt will demonstrate static standing with min assist   Skilled Therapeutic Interventions/Progress Updates:   Patient seen for makeup session with daughter present to address sitting tolerance and wheelchair positioning, functional transfers, and ambulation. Per primary PT, patient to return to [place] wheelchair due to unsafe behaviors/scooting out of manual wheelchair this date. Retrieved 18 x 18 TIS wheelchair and cushion. Patient awake in bed, resistant to participate in therapy requiring max coaxing from therapist and daughter but eventually agreeable. Patient with L PRAFO donned without kickstand in use and patient's LLE in externally rotated position, performed PROM into neutral with patient grimacing and daughter educated on proper use of PRAFO/positioning with kickstand, verbalized understanding. Patient required more than reasonable amount of time and max A to transfer supine > sitting EOB due to refusal/difficulty following basic commands for safe sequencing. Performed squat pivot transfer bed > wheelchair with max A. Adjusted leg rests for proper fit and improved positioning in TIS wheelchair and patient kept in slightly tilted position for safety with no evidence of attempting to slide hips forward out of chair. Gait training using R rail in hallway x 5 ft + 10 ft with max A to advance/stabilize LLE with patient pushing strongly to L  during RLE swing phase and keeping R elbow locked out in extension. Ambulation trials terminated due to patient stopping and refusing to continue, required max encouragement from daughter and sister at end of session. Patient left up in [place] wheelchair with quick release belt in place and family in room, instructed family to alert nursing when they leave or if patient attempts to slide out of chair and both verbalized understanding.   Therapy Documentation Precautions:  Precautions Precautions: Fall Precaution Comments: left hemi, L inattention, impulsive Restrictions Weight Bearing Restrictions: No Pain: Pain Assessment Pain Assessment: Faces Faces Pain Scale: No hurt   See Function Navigator for Current Functional Status.   Therapy/Group: Individual Therapy  Laretta Alstrom 04/22/2016, 4:23 PM

## 2016-04-22 NOTE — Progress Notes (Signed)
Occupational Therapy Weekly Progress Note  Patient Details  Name: Abigail Campos MRN: [id number] Date of Birth: February 12, 1971  Beginning of progress report period: Apr 16, 2016 End of progress report period: Apr 22, 2016  Today's Date: 04/22/2016 OT Individual Time: 1001-1041 OT Individual Time Calculation (min): 40 min  20 missed minutes of OT session.   Patient has met 1 of 4 short term goals. Pt making slow progress towards occupational therapy goals this week. Pt is often fatigued during therapy sessions and likely behavioral concerns as well which impact therapy sessions. Pt continues to be flaccid on L UE with no trace of muscle movement throughout.   Patient continues to demonstrate the following deficits: decreased I in self care, functional transfers/mobility, static and dynamic balance, L UE and L LE functional strength and AROM, decreased cognition, safety awareness, and fatigue and therefore will continue to benefit from skilled OT intervention to enhance overall performance with BADL.  Patient not progressing toward long term goals.  See goal revision.Marland Kitchen  dynamic sitting balance downgraded to mod A based on pt's progress  OT Short Term Goals Week 2:  OT Short Term Goal 1 (Week 2): Pt will participate in 3 minutes of self care task with 2 rest breaks or less in order to increase alertness and endurance for functional tasks.  OT Short Term Goal 1 - Progress (Week 2): Progressing toward goal OT Short Term Goal 2 (Week 2): Pt will perform UB dressing with mod A in order to decrease level of assist for self care. OT Short Term Goal 2 - Progress (Week 2): Met OT Short Term Goal 3 (Week 2): Pt will perform LB dressing with max A in order to decrease level of assist for self care.  OT Short Term Goal 3 - Progress (Week 2): Progressing toward goal OT Short Term Goal 4 (Week 2): Caregiver will assist pt with L UE self ROM HEP in order to ensure correct exercise performed with use of paper  handout in order to demonstrate knowledge of proper technique.  OT Short Term Goal 4 - Progress (Week 2): Not met Week 3:  OT Short Term Goal 1 (Week 3): Pt will locate and maintain L UE during self care tasks for safety awareness with mod verbal cues.  OT Short Term Goal 2 (Week 3): Pt will participate in 3 minutes of self care tasks with 2 rest breaks or less in order to increase alertness and endurance for functional tasks.  OT Short Term Goal 3 (Week 3): Pt will perform toileting with max A in order to decrease level of assist with functional task.   Skilled Therapeutic Interventions/Progress Updates:  Upon entering the room, pt supine in bed requiring increased time to awaken and for alertness. Pt's clothing and bed linen's soaked with urine and pt unaware. Pt needing mod A for supine >sit to EOB. Max A squat pivot transfer into wheelchair with max mutimodal cues. Pt transferring onto standard height toilet with use of grab bar and max A. Pt needing total A for clothing management and hygiene. Pt not verbalizing any response to therapist questions this session. Transfer back to wheelchair required +2 assist for safety. Pt placing foot onto wall, pushing with R UE, and not following 1 step , simple verbal commands for safety. Pt returned to bed with total A for sit >supine as pt would not longer interact with therapist. Vital taken and were WNL. RN notified. 20 missed minutes of session. Call bell and all  needed items within reach upon exiitng the room.  Therapy Documentation Precautions:  Precautions Precautions: Fall Precaution Comments: left hemi, L inattention, impulsive Restrictions Weight Bearing Restrictions: No  Pain: Pain Assessment Pain Assessment: No/denies pain Faces Pain Scale: Hurts even more Pain Type: Acute pain Pain Location: Head Pain Orientation: Right;Anterior Pain Descriptors / Indicators: Headache Pain Onset: On-going Patients Stated Pain Goal: 2 Pain  Intervention(s): Medication (See eMAR) Multiple Pain Sites: No  See Function Navigator for Current Functional Status.   Therapy/Group: Individual Therapy  Phineas Semen 04/22/2016, 10:57 AM

## 2016-04-22 NOTE — Plan of Care (Signed)
Problem: RH Balance Goal: LTG: Patient will maintain dynamic sitting balance (OT) LTG: Patient will maintain dynamic sitting balance with assistance during activities of daily living (OT)  Downgraded secondary to lack of progress and safety

## 2016-04-23 ENCOUNTER — Inpatient Hospital Stay (HOSPITAL_COMMUNITY): Payer: Managed Care, Other (non HMO) | Admitting: Occupational Therapy

## 2016-04-23 ENCOUNTER — Inpatient Hospital Stay (HOSPITAL_COMMUNITY): Payer: Managed Care, Other (non HMO) | Admitting: Speech Pathology

## 2016-04-23 ENCOUNTER — Inpatient Hospital Stay (HOSPITAL_COMMUNITY): Payer: Managed Care, Other (non HMO) | Admitting: Physical Therapy

## 2016-04-23 LAB — URINE CULTURE

## 2016-04-23 MED ORDER — LISINOPRIL 40 MG PO TABS: 40.0000 mg | ORAL_TABLET | Freq: Every day | ORAL | Status: DC

## 2016-04-23 MED ORDER — LISINOPRIL 10 MG PO TABS: 10.0000 mg | ORAL_TABLET | Freq: Every day | ORAL | Status: DC

## 2016-04-23 MED ORDER — FELODIPINE ER 2.5 MG PO TB24
2.5000 mg | ORAL_TABLET | Freq: Every day | ORAL | Status: DC
  Administered 2016-04-24 – 2016-04-26 (×3): 2.5 mg via ORAL
  Filled 2016-04-23 (×3): qty 1

## 2016-04-23 MED ORDER — LISINOPRIL 20 MG PO TABS
20.0000 mg | ORAL_TABLET | Freq: Every day | ORAL | Status: DC
  Administered 2016-04-23 – 2016-04-26 (×4): 20 mg via ORAL
  Filled 2016-04-23 (×4): qty 1

## 2016-04-23 NOTE — Progress Notes (Signed)
Physical Therapy Session Note  Patient Details  Name: Abigail Campos MRN: [id number] Date of Birth: [birth year]  Today's Date: 04/23/2016 PT Individual Time: 1430-1600 PT Individual Time Calculation (min): 90 min   Short Term Goals: Week 3:  PT Short Term Goal 1 (Week 3): Pt will demonstrate static sitting balance x10 minutes with supervision PT Short Term Goal 2 (Week 3): Pt will demonstrate transfers with mod assist in 3/4 observed trials PT Short Term Goal 3 (Week 3): Pt will propel w/c x150' with supervision PT Short Term Goal 4 (Week 3): Pt will demonstrate static standing with min assist   Skilled Therapeutic Interventions/Progress Updates:    Pt received resting in w/c with no c/o pain, only fatigue, but agreeable to therapy session.    NMR in tall kneeling focus on hip extension and upright posture.  Pt grimacing and groaning, transitioned to supine with max assist.  NMR via gait training focus on upright posture, weight shift L, and L hip/knee extension.  PT providing max assist for gait with +2 for w/c follow.  Pt able to engage in gait training x10' +5', + 15' with rail in hallway.  NMR standing at edge of therapy mat focus on weight bearing through BLEs and weight shifting R/L while reaching for horseshoes.  Extended therapeutic rest breaks provided throughout session as needed for pt to recover from mental and physical fatigue.    Pt returned to room end of session and performed squat/pivot back to bed with max assist.  Sit>supine with min assist for LLE and pt positioned to comfort with call bell in reach and needs met.    Therapy Documentation Precautions:  Precautions Precautions: Fall Precaution Comments: left hemi, L inattention, impulsive Restrictions Weight Bearing Restrictions: No   See Function Navigator for Current Functional Status.   Therapy/Group: Individual Therapy  [name] 04/23/2016, 4:22 PM

## 2016-04-23 NOTE — Progress Notes (Signed)
Occupational Therapy Session Note  Patient Details  Name: Abigail Campos MRN: [id number] Date of Birth: [birth year]  Today's Date: 04/23/2016 OT Individual Time: 1005-1100 OT Individual Time Calculation (min): 55 min    Short Term Goals: Week 3:  OT Short Term Goal 1 (Week 3): Pt will locate and maintain L UE during self care tasks for safety awareness with mod verbal cues.  OT Short Term Goal 2 (Week 3): Pt will participate in 3 minutes of self care tasks with 2 rest breaks or less in order to increase alertness and endurance for functional tasks.  OT Short Term Goal 3 (Week 3): Pt will perform toileting with max A in order to decrease level of assist with functional task.   Skilled Therapeutic Interventions/Progress Updates:  Upon entering the room, pt seated on EOB with therapist. Pt transitioning easily into this session. Pt with no signs or symptoms of pain. Family present in room but quickly left to give pt privacy and decrease distractions. Pt motivated for shower this session. Pt transferred into wheelchair from bed with max A for squat pivot transfer to the right. OT propelled pt into bathroom via wheelchair. Pt transferred from wheelchair <> TTB with ue of grab bar and max A for stand pivot transfer. Pt following 1 step verbal commands this session. Pt needing only steady assist for balance while seated on TTB. Pt showing less impulsivity this session while bathing. OT assisted pt with hand over hand technique in order to incorporate L UE into functional tasks. OT assisted with lateral leans in order to wash buttocks while seated on TTB. Non slid socks remained on pt for traction and to decrease fall risk. Pt returned to wheelchair for dressing tasks in wheelchair at sink side. Pt continues to need max cues and hand over hand assist for hemiplegic dressing technique. LB dressing performed with +2 assist secondary to pt fatigue by end of session. Quick release belt donned as well as L UE  lap tray. Family members reentered the room as OT exits. Call bell and all needed items within reach upon exiting the room.   Therapy Documentation Precautions:  Precautions Precautions: Fall Precaution Comments: left hemi, L inattention, impulsive Restrictions Weight Bearing Restrictions: No  See Function Navigator for Current Functional Status.   Therapy/Group: Individual Therapy  Phineas Semen 04/23/2016, 11:41 AM

## 2016-04-23 NOTE — Patient Care Conference (Signed)
Inpatient RehabilitationTeam Conference and Plan of Care Update Date: 04/22/2016   Time: 2:30 PM    Patient Name: Abigail Campos      Medical Record Number: [id number]  Date of Birth: 06/09/1971 Sex: Female         Room/Bed: 4W05C/4W05C-01 Payor Info: Payor: Holland Falling / Plan: Alwyn Pea / Product Type: *No Product type* /    Admitting Diagnosis: R  BG hemorrhage  Admit Date/Time:  04/08/2016  6:25 PM Admission Comments: No comment available   Primary Diagnosis:  <principal problem not specified> Principal Problem: <principal problem not specified>  Patient Active Problem List   Diagnosis Date Noted  . Bradycardia, drug induced   . Hypotension due to drugs   . Poor nutrition   . Muscle spasticity   . Hypertensive emergency 04/08/2016  . Obesity (BMI 30.0-34.9) 04/08/2016  . Hyperlipidemia 04/08/2016  . Basal ganglia hemorrhage ([place]) 04/08/2016  . Aphasia as late effect of cerebrovascular accident   . Dysphagia as late effect of cerebrovascular disease   . Migraine with aura and without status migrainosus, not intractable   . HLD (hyperlipidemia)   . Benign essential HTN   . Gait disturbance, post-stroke   . Hemiplegia, post-stroke ([place])   . Hypokalemia   . Dysphagia, post-stroke   . Aphasia, post-stroke   . Bradycardia   . Headache, migraine   . Dysarthria, post-stroke   . Encephalopathy acute 04/02/2016  . ICH (intracerebral hemorrhage) ([facility]) - R basal ganglia due to hypertensive emergency 04/01/2016  . Upper airway cough syndrome 12/06/2015  . PCP NOTES >>> 09/25/2015  . Acute bronchitis 11/06/2014  . Need for hepatitis B vaccination 10/19/2014  . Insomnia 12/28/2012  . Annual physical exam 11/23/2011  . Hemorrhoids 10/13/2011  . Morbid obesity ([place]) 02/26/2011  . CHEST PAIN UNSPECIFIED 03/28/2010  . Essential hypertension 10/19/2007  . Headache(784.0) 05/12/2007    Expected Discharge Date: Expected Discharge Date: 05/06/16  Team Members Present: Physician  leading conference: Dr. Delice Lesch Social Worker Present: Lennart Pall, LCSW Nurse Present: Dorien Chihuahua, RN PT Present: Dwyane Dee, PT;Rodney Lajuana Matte, PT OT Present: Benay Pillow, OT SLP Present: Weston Anna, SLP PPS Coordinator present : Daiva Nakayama, RN, CRRN     Current Status/Progress Goal Weekly Team Focus  Medical   Left hemiplegia, aphasia, dysphagia secondary to right basal ganglia hemorrhage with lethargy and spasticity now  Improve mobility, education of pt and family, PO intake, somnolence  see above   Bowel/Bladder   incontinet of bowel and bladder  LBM 04/19/16  To be continent of B&B  Monitor B&B function   Swallow/Nutrition/ Hydration   Dys. 2 textures with thin liquids, Mod-Max A for use of swallowing strategies  Min A with least restrictive diet  use of swallowing strategies, trials of Dys. 3 textures   ADL's   current status varies  with pt needing max - total :A for functional transfers, mod A for UB dressing, max - total A for LB dressing, total A for toileting  set up for grooming and eating, min A for UB self care, mod A for all other goals  pt/family edu, balance, self care retraining, NMR L UE, functional transfers/mobility   Mobility   all mobility ranging from min>total assist based on arousal/behavior?/fatigue level  mod assist overall  strict out of bed schedule starting 5/11 to increase arousal, sitting balance, standing posture, postural control, transfers, weight bearing, NMES for LLE   Communication   Mod-Max A   Min A  naming, yes/no accruacy, self-monitoring and correcting verbal errors    Safety/Cognition/ Behavioral Observations  Max A   Min A  sustained attention, intellectual awareness, problem solving, attention to left    Pain   C/o headache sometimes, Fioricet 1 Tab given prn  Pain <3  Assess pain aand treat pain during shift   Skin   No skin breakdown  No new skin breakdown/infection  Assess skin during shift    Rehab  Goals Patient on target to meet rehab goals: Yes *See Care Plan and progress notes for long and short-term goals.  Barriers to Discharge: poor appetite, flaccid L side, spasticity, hypotension now, lethargy    Possible Resolutions to Barriers:  Optimize appetite meds, NMES, pt and family edu, optimize BP meds, improve arousal    Discharge Planning/Teaching Needs:  Plan to d/c home with husband and other family members providing 24/7 assistance.  Teaching to be scheduled.   Team Discussion:  Medically stable;  Mild BP issues.  Not a good day with therapies today.  Incont, resistant and pushing away.  Had a very good, productive day yesterday.  Does not appear that husband's presence makes a difference in her participation.  Will recheck UA, labs, start strict OOB schedule and med changes (will stop seroquel).  Will monitor participation/ behavior.  Feel she has very good potential for improvement.  Revisions to Treatment Plan:  See above.   Continued Need for Acute Rehabilitation Level of Care: The patient requires daily medical management by a physician with specialized training in physical medicine and rehabilitation for the following conditions: Daily direction of a multidisciplinary physical rehabilitation program to ensure safe treatment while eliciting the highest outcome that is of practical value to the patient.: Yes Daily medical management of patient stability for increased activity during participation in an intensive rehabilitation regime.: Yes Daily analysis of laboratory values and/or radiology reports with any subsequent need for medication adjustment of medical intervention for : Neurological problems;Blood pressure problems  Sanyia Dini 04/23/2016, 1:35 PM

## 2016-04-23 NOTE — Progress Notes (Signed)
[facility]     PROGRESS NOTE  Subjective/Complaints:  Patient seen lying in bed this morning she is alert and interactive. She continues to repeat words and has difficulty following simple commands.  ROS: Unable to obtain due to mental condition.  Objective: Vital Signs: Blood pressure 117/76, pulse 58, temperature 98.3 F (36.8 C), temperature source Oral, resp. rate 20, weight 66.089 kg (145 lb 11.2 oz), SpO2 100 %. No results found. No results for input(s): WBC, HGB, HCT, PLT in the last 72 hours. No results for input(s): NA, K, CL, GLUCOSE, BUN, CREATININE, CALCIUM in the last 72 hours.  Invalid input(s): CO CBG (last 3)  No results for input(s): GLUCAP in the last 72 hours.  Wt Readings from Last 3 Encounters:  04/22/16 66.089 kg (145 lb 11.2 oz)  04/06/16 73.6 kg (162 lb 4.1 oz)  01/22/16 74.9 kg (165 lb 2 oz)    Physical Exam:  BP 117/76 mmHg  Pulse 58  Temp(Src) 98.3 F (36.8 C) (Oral)  Resp 20  Wt 66.089 kg (145 lb 11.2 oz)  SpO2 100% Constitutional: She appears well-developed. Obese. NAD. Vital signs reviewed. HENT:  Normocephalic and atraumatic.  Eyes: Conjunctivae and EOM are normal.     Cardiovascular: Normal rate and regular rhythm.    Respiratory: Effort normal and breath sounds normal. No respiratory distress.   GI: Soft. Bowel sounds are normal. She exhibits no distension.  Musculoskeletal: She exhibits no edema or no tenderness.  Neurological: She is alert.  Inconsistently follows commands Expressive And receptive aphasia, Perseverative  Dysarthria Left facial weakness Motor: Moving right upper and right lower extremity freely.  Not moving left side.  MAS 0/4 Elbow flexion In left upper extremity (previously 1/4) Skin: Skin is warm and dry.  Psychiatric:  Unable to assess   Assessment/Plan: 1. Functional deficits secondary to right basal ganglia hemorrhage which require 3+ hours per day of interdisciplinary  therapy in a comprehensive inpatient rehab setting. Physiatrist is providing close team supervision and 24 hour management of active medical problems listed below. Physiatrist and rehab team continue to assess barriers to discharge/monitor patient progress toward functional and medical goals.  Function:  Bathing Bathing position Bathing activity did not occur: Refused Position: Wheelchair/chair at sink  Bathing parts Body parts bathed by patient: Chest, Abdomen, Left arm, Right upper leg, Left upper leg Body parts bathed by helper: Right arm, Buttocks, Right lower leg, Front perineal area, Left lower leg, Back  Bathing assist Assist Level: 2 helpers      Upper Body Dressing/Undressing Upper body dressing Upper body dressing/undressing activity did not occur: Refused What is the patient wearing?: Pull over shirt/dress, Bra Bra - Perfomed by patient: Thread/unthread right bra strap Bra - Perfomed by helper: Thread/unthread left bra strap, Hook/unhook bra (pull down sports bra) Pull over shirt/dress - Perfomed by patient: Thread/unthread right sleeve, Put head through opening Pull over shirt/dress - Perfomed by helper: Thread/unthread left sleeve, Pull shirt over trunk        Upper body assist Assist Level: Touching or steadying assistance(Pt > 75%)      Lower Body Dressing/Undressing Lower body dressing Lower body dressing/undressing activity did not occur: Refused What is the patient wearing?: Pants, Non-skid slipper socks     Pants- Performed by patient: Thread/unthread right pants leg Pants- Performed by helper: Thread/unthread left pants leg, Pull pants up/down   Non-skid slipper socks- Performed by helper: Don/doff right sock, Don/doff left sock  TED Hose - Performed by helper: Don/doff right TED hose, Don/doff left TED hose  Lower body assist Assist for lower body dressing:  (total A)      Toileting Toileting Toileting activity did not occur: No  continent bowel/bladder event Toileting steps completed by patient: Performs perineal hygiene Toileting steps completed by helper: Adjust clothing prior to toileting, Performs perineal hygiene, Adjust clothing after toileting Toileting Assistive Devices: Grab bar or rail  Toileting assist Assist level:  (total )   Transfers Chair/bed transfer   Chair/bed transfer method: Squat pivot Chair/bed transfer assist level: Maximal assist (Pt 25 - 49%/lift and lower) Chair/bed transfer assistive device: Armrests     Locomotion Ambulation Ambulation activity did not occur: Safety/medical concerns   Max distance: 15 Assist level: 2 helpers (+2 WC follow)   Wheelchair   Type: Manual (TIS) Max wheelchair distance: 150 Assist Level: Dependent (Pt equals 0%)  Cognition Comprehension Comprehension assist level: Understands basic 50 - 74% of the time/ requires cueing 25 - 49% of the time  Expression Expression assist level: Expresses basic 25 - 49% of the time/requires cueing 50 - 75% of the time. Uses single words/gestures.  Social Interaction Social Interaction assist level: Interacts appropriately 25 - 49% of time - Needs frequent redirection.  Problem Solving Problem solving assist level: Solves basic 25 - 49% of the time - needs direction more than half the time to initiate, plan or complete simple activities  Memory Memory assist level: Recognizes or recalls 25 - 49% of the time/requires cueing 50 - 75% of the time     Medical Problem List and Plan: 1.Left hemiplegia, aphasia, dysphagia secondary to right basal ganglia hemorrhage secondary to hypertensive crisis on 04/01/16.   Continue CIR  PRAFO for left lower extremity and splints for left upper extremity ordered  Fluoxetine 10 started on 5/1, Increased to 20 on 5/8  For therapies, patient with increased lethargy, medications adjusted, will continue to monitor 2. DVT Prophylaxis/Anticoagulation: Subcutaneous heparin for DVT prophylaxis  initiated 04/07/2016 3. Pain Management/migraine headaches: Fioricet for headaches and Robaxin. 4. Dysphagia. #2 thin liquid diet. Speech therapy follow-up. Will advance diet as tolerated  Remeron started on 5/4, Will consider further increase in future.   Eating 5-70 % of her meals, overall improving.  5. Neuropsych: This patient is not capable of making decisions on her own behalf. 6. Skin/Wound Care: Routine skin checks 7. Fluids/Electrolytes/Nutrition: Routine I&O   BMP within normal range on 5/2 8. Hypertension.   Plendil 10 mg daily, plan to decrease to 2.5 mg on 5/12  Hydralazine 10 mg 3 times a day, DC'd on 5/10.   Lisinopril 20 mg twice a day, increased to 40 mg on 4/28, changed to 40 mg daily on 5/11, plan to decrease to 20 mg daily on [id number]  Bysystolic 20 mg every 12 hours, decreased to 5 mg on 5/9  Monitor 9. Hyperlipidemia. Lipitor 10. Spasticity: Controlled  Continue baclofen 5 mg on 4/27, DC'd on 5/10 11. Thrush:   Treated with Diflucan, DC'd on 5/3  LOS (Days) 15 A FACE TO FACE EVALUATION WAS PERFORMED  Lavergne Hiltunen [name] 04/23/2016 9:47 AM

## 2016-04-23 NOTE — Progress Notes (Signed)
Occupational Therapy Session Note  Patient Details  Name: Abigail Campos MRN: [id number] Date of Birth: Nov 08, 1971  Today's Date: 04/23/2016 OT Individual Time: 0930-1000 OT Individual Time Calculation (min): 30 min     Make-up Session   Skilled Therapeutic Interventions/Progress Updates:    Worked on AAROM exercises in supine for the left shoulder, elbow, and digits.  No active movement noted with repetitions of 5-6 for each joint.  Pt maintain visual attention on the UE during AAROM exercises.  Transitioned to sitting with mod assist, including rolling to the left side.  She needed initial min assist for static sitting balance with RUE support but progressed to supervision.  Incorporated reaching tasks to the bedside table with the RUE, while in sitting to work on trunk control.  Min to mod assist to maintain balance and re-gain midline orientation during these trials.  LUE placed in weightbearing on EOB and therapist's leg during reaching.  Pt left with OT for next session at 10:00.     Therapy Documentation Precautions:  Precautions Precautions: Fall Precaution Comments: left hemi, L inattention, impulsive Restrictions Weight Bearing Restrictions: No  Pain: Pain Assessment Pain Assessment: No/denies pain ADL: See Function Navigator for Current Functional Status.   Therapy/Group: Individual Therapy  English Tomer OTR/L 04/23/2016, 12:55 PM

## 2016-04-23 NOTE — Progress Notes (Addendum)
Physical Therapy Session Note  Patient Details  Name: Abigail Campos MRN: [id number] Date of Birth: 30-Oct-1971  Today's Date: 04/23/2016 PT Individual Time: 1345-1430 PT Individual Time Calculation (min): 45 min   Short Term Goals: Week 3:  PT Short Term Goal 1 (Week 3): Pt will demonstrate static sitting balance x10 minutes with supervision PT Short Term Goal 2 (Week 3): Pt will demonstrate transfers with mod assist in 3/4 observed trials PT Short Term Goal 3 (Week 3): Pt will propel w/c x150' with supervision PT Short Term Goal 4 (Week 3): Pt will demonstrate static standing with min assist   Skilled Therapeutic Interventions/Progress Updates:    Pt seen for make up session 45 min. Pt received seated in w/c, no evidence of pain via FACES scale and agreeable to treatment. Husband present for session to assist with w/c follow during gait. Gait 1x30', 2x15' with R rail in hall, maxA for LLE progression and stance control with +2 w/c follow. Note inconsistent L quad activation in stance on approximately 50% of steps. Required cues for RLE progression to stay closer to the wall due to scissoring and narrow BOS. Pt engaged in seated cognitive task for LE rest break before next session. Sorting task with colored bean bags; initially only three color groups however pt performing well and increased to five colors groups; performed with 95% accuracy. Item identification task with items from sink; 100% accurate from two item choices. Remained seated in w/c with quick release belt intact at completion of session, all needs within reach with next therapist entering room.  Therapy Documentation Precautions:  Precautions Precautions: Fall Precaution Comments: left hemi, L inattention, impulsive Restrictions Weight Bearing Restrictions: No Pain: Pain Assessment Pain Assessment: Faces Faces Pain Scale: No hurt   See Function Navigator for Current Functional Status.   Therapy/Group: Individual  Therapy  Luberta Mutter 04/23/2016, 3:40 PM

## 2016-04-23 NOTE — Progress Notes (Signed)
Speech Language Pathology Daily Session Note  Patient Details  Name: Abigail Campos MRN: [id number] Date of Birth: 1971-05-11  Today's Date: 04/23/2016 SLP Individual Time: 1100-1130 SLP Individual Time Calculation (min): 30 min  Short Term Goals: Week 2: SLP Short Term Goal 1 (Week 2): Patient will consume current diet with minimal overt s/s of aspiration with Min A verbal cues for use of swallowing strategies.  SLP Short Term Goal 2 (Week 2): Patient will demonstrate sustained attention to functional tasks for 5 minutes with Min A verbal cues for redirection.  SLP Short Term Goal 3 (Week 2): Patient will attend to left field of enviornment during functional tasks with Min A verbal ces.  SLP Short Term Goal 4 (Week 2): Patient will answer basic yes/no questions with 50% accuracy in regards to wants/needs with Min A multimodal cues.  SLP Short Term Goal 5 (Week 2): Patient will utilize speech intelligibility strategies at the word level with Mod A verbal cues to achieve 50% intelligibility.,  SLP Short Term Goal 6 (Week 2): Patient will name common objects with 50% accuracy with Mod A multimodal cues.   Skilled Therapeutic Interventions: Skilled treatment session focused on functional communication. SLP facilitated session by providing Mod A semantic and phonemic cues for word-finding during a generative naming task and Mod-Max A multimodal cues for patient to self-monitor and correct phonemic errors. Patient overall more engaged today. Patient left upright in wheelchair with all needs within reach. Continue with current plan of care.    Function:  Cognition Comprehension Comprehension assist level: Understands basic 50 - 74% of the time/ requires cueing 25 - 49% of the time  Expression   Expression assist level: Expresses basic 25 - 49% of the time/requires cueing 50 - 75% of the time. Uses single words/gestures.  Social Interaction Social Interaction assist level: Interacts  appropriately 25 - 49% of time - Needs frequent redirection.  Problem Solving Problem solving assist level: Solves basic 25 - 49% of the time - needs direction more than half the time to initiate, plan or complete simple activities  Memory Memory assist level: Recognizes or recalls 25 - 49% of the time/requires cueing 50 - 75% of the time    Pain Pain Assessment Pain Assessment: Faces Faces Pain Scale: No hurt  Therapy/Group: Individual Therapy  Sakia Schrimpf 04/23/2016, 3:31 PM

## 2016-04-23 NOTE — Progress Notes (Signed)
Speech Language Pathology Daily Session Note  Patient Details  Name: Abigail Campos MRN: [id number] Date of Birth: 04/12/1971  Today's Date: 04/23/2016 SLP Individual Time: 0900-0930 SLP Individual Time Calculation (min): 30 min  Short Term Goals: Week 2: SLP Short Term Goal 1 (Week 2): Patient will consume current diet with minimal overt s/s of aspiration with Min A verbal cues for use of swallowing strategies.  SLP Short Term Goal 2 (Week 2): Patient will demonstrate sustained attention to functional tasks for 5 minutes with Min A verbal cues for redirection.  SLP Short Term Goal 3 (Week 2): Patient will attend to left field of enviornment during functional tasks with Min A verbal ces.  SLP Short Term Goal 4 (Week 2): Patient will answer basic yes/no questions with 50% accuracy in regards to wants/needs with Min A multimodal cues.  SLP Short Term Goal 5 (Week 2): Patient will utilize speech intelligibility strategies at the word level with Mod A verbal cues to achieve 50% intelligibility.,  SLP Short Term Goal 6 (Week 2): Patient will name common objects with 50% accuracy with Mod A multimodal cues.   Skilled Therapeutic Interventions: Pt seen for SLP therapy with primary focus on cognitive goals and functional communication. Pt communicated need for toileting by saying, "pee pee" and was able to follow functional directives to participate in task. Simple money counting task limited by impulsivity and aphasia, however pt accuracy improved with verbal and visual cueing to slow speed of work. 75% for providing given amounts in limited field of choices.   Function:  Eating Eating                 Cognition Comprehension Comprehension assist level: Understands basic 50 - 74% of the time/ requires cueing 25 - 49% of the time  Expression   Expression assist level: Expresses basic 25 - 49% of the time/requires cueing 50 - 75% of the time. Uses single words/gestures.  Social  Interaction Social Interaction assist level: Interacts appropriately 25 - 49% of time - Needs frequent redirection.  Problem Solving Problem solving assist level: Solves basic 25 - 49% of the time - needs direction more than half the time to initiate, plan or complete simple activities  Memory Memory assist level: Recognizes or recalls 25 - 49% of the time/requires cueing 50 - 75% of the time    Pain Pain Assessment Pain Assessment: Faces Faces Pain Scale: No hurt  Therapy/Group: Individual Therapy  Vinetta Bergamo MA, CCC-SLP 04/23/2016, 3:46 PM

## 2016-04-24 ENCOUNTER — Inpatient Hospital Stay (HOSPITAL_COMMUNITY): Payer: Managed Care, Other (non HMO) | Admitting: Speech Pathology

## 2016-04-24 ENCOUNTER — Inpatient Hospital Stay (HOSPITAL_COMMUNITY): Payer: Managed Care, Other (non HMO) | Admitting: Occupational Therapy

## 2016-04-24 ENCOUNTER — Inpatient Hospital Stay (HOSPITAL_COMMUNITY): Payer: Managed Care, Other (non HMO) | Admitting: Physical Therapy

## 2016-04-24 MED ORDER — DRONABINOL 2.5 MG PO CAPS
2.5000 mg | ORAL_CAPSULE | Freq: Two times a day (BID) | ORAL | Status: DC
  Administered 2016-04-24 – 2016-04-27 (×8): 2.5 mg via ORAL
  Filled 2016-04-24 (×8): qty 1

## 2016-04-24 NOTE — Progress Notes (Signed)
Occupational Therapy Session Note  Patient Details  Name: Abigail Campos MRN: [id number] Date of Birth: 02/03/1971  Today's Date: 04/24/2016 OT Individual Time:  -  1000-1100  (60 min)       Short Term Goals: Week 1:  OT Short Term Goal 1 (Week 1): Pt will participate in 5 minutes of self care tasks with 2 breaks or less in order to increase alertness and endurance for functional tasks. OT Short Term Goal 1 - Progress (Week 1): Progressing toward goal OT Short Term Goal 2 (Week 1): Pt will visually track to the L to locate 3 items during OT intervention with min verbal cues. OT Short Term Goal 2 - Progress (Week 1): Met OT Short Term Goal 3 (Week 1): Pt will perform transfer with one person to toilet in order to decrease level of assist needed with functional transfers.  OT Short Term Goal 3 - Progress (Week 1): Met Week 2:  OT Short Term Goal 1 (Week 2): Pt will participate in 3 minutes of self care task with 2 rest breaks or less in order to increase alertness and endurance for functional tasks.  OT Short Term Goal 1 - Progress (Week 2): Progressing toward goal OT Short Term Goal 2 (Week 2): Pt will perform UB dressing with mod A in order to decrease level of assist for self care. OT Short Term Goal 2 - Progress (Week 2): Met OT Short Term Goal 3 (Week 2): Pt will perform LB dressing with max A in order to decrease level of assist for self care.  OT Short Term Goal 3 - Progress (Week 2): Progressing toward goal OT Short Term Goal 4 (Week 2): Caregiver will assist pt with L UE self ROM HEP in order to ensure correct exercise performed with use of paper handout in order to demonstrate knowledge of proper technique.  OT Short Term Goal 4 - Progress (Week 2): Not met  Skilled Therapeutic Interventions/Progress Updates:    Pt lying in bed upon OT arrival.  Daughter, Joneen Boers present during session and assisted as needed.  Pt was max assist from supine to sit and to wc with squat pivot  transfer.  Pt able to sit EOB unsupported for 5 minutes with cues for postural control and static balance with RUE support and supervision.  Dynamic sitting balance is max assist.   LUE placed in weightbearing on EOB proprioceptive input.  Sat at sink for bathing and dressing.  Pt engaged in bathing with Hand over hand for  LUE and assist for movement patterns and crossing midline>  Educated pt on visual attention to LUE during movements.  Pt did sit to stand and standing balance with max assist for peri care.  Pt was dependent with washing peri area.  Pt left in wc with slight reclining position and safety belt in place.        Therapy Documentation Precautions:  Precautions Precautions: Fall Precaution Comments: left hemi, L inattention, impulsive Restrictions Weight Bearing Restrictions: No    Vital Signs: Therapy Vitals Temp: 98.6 F (37 C) Temp Source: Oral Pulse Rate: 60 Resp: 18 BP: 121/71 mmHg Patient Position (if appropriate): Lying Oxygen Therapy SpO2: 100 % O2 Device: Not Delivered Pain:   none        See Function Navigator for Current Functional Status.   Therapy/Group: Individual Therapy  Lisa Roca 04/24/2016, 7:56 AM

## 2016-04-24 NOTE — Progress Notes (Signed)
[facility]     PROGRESS NOTE  Subjective/Complaints:  Pt laying in bed this AM with eyes open.  She is wearing her splint.  When asked how she is doing, she states, "good".   ROS: Unable to obtain due to mental condition.  Objective: Vital Signs: Blood pressure 125/70, pulse 60, temperature 98.6 F (37 C), temperature source Oral, resp. rate 18, weight 66.9 kg (147 lb 7.8 oz), SpO2 100 %. No results found. No results for input(s): WBC, HGB, HCT, PLT in the last 72 hours. No results for input(s): NA, K, CL, GLUCOSE, BUN, CREATININE, CALCIUM in the last 72 hours.  Invalid input(s): CO CBG (last 3)  No results for input(s): GLUCAP in the last 72 hours.  Wt Readings from Last 3 Encounters:  04/24/16 66.9 kg (147 lb 7.8 oz)  04/06/16 73.6 kg (162 lb 4.1 oz)  01/22/16 74.9 kg (165 lb 2 oz)    Physical Exam:  BP 125/70 mmHg  Pulse 60  Temp(Src) 98.6 F (37 C) (Oral)  Resp 18  Wt 66.9 kg (147 lb 7.8 oz)  SpO2 100% Constitutional: She appears well-developed. Obese. NAD. Vital signs reviewed. HENT:  Normocephalic and atraumatic.  Eyes: Conjunctivae and EOM are normal.     Cardiovascular: Normal rate and regular rhythm.    Respiratory: Effort normal and breath sounds normal. No respiratory distress.   GI: Soft. Bowel sounds are normal. She exhibits no distension.  Musculoskeletal: She exhibits no edema or no tenderness.  Neurological: She is alert.  Inconsistently follows commands Expressive And receptive aphasia, Perseverative  Dysarthria Left facial weakness Motor: Moving right upper and right lower extremity freely.  Not moving left side.  MAS 0/4 Elbow flexion In left upper extremity (previously 1/4) Skin: Skin is warm and dry.  Psychiatric:  Unable to assess   Assessment/Plan: 1. Functional deficits secondary to right basal ganglia hemorrhage which require 3+ hours per day of interdisciplinary therapy in a comprehensive inpatient  rehab setting. Physiatrist is providing close team supervision and 24 hour management of active medical problems listed below. Physiatrist and rehab team continue to assess barriers to discharge/monitor patient progress toward functional and medical goals.  Function:  Bathing Bathing position Bathing activity did not occur: Refused Position: Shower  Bathing parts Body parts bathed by patient: Chest, Abdomen, Left arm, Right upper leg, Left upper leg, Front perineal area Body parts bathed by helper: Right arm, Buttocks, Right lower leg, Left lower leg  Bathing assist Assist Level:  (mod A)      Upper Body Dressing/Undressing Upper body dressing Upper body dressing/undressing activity did not occur: Refused What is the patient wearing?: Pull over shirt/dress, Bra Bra - Perfomed by patient: Thread/unthread right bra strap Bra - Perfomed by helper: Thread/unthread left bra strap, Hook/unhook bra (pull down sports bra) Pull over shirt/dress - Perfomed by patient: Thread/unthread right sleeve, Put head through opening Pull over shirt/dress - Perfomed by helper: Thread/unthread left sleeve, Pull shirt over trunk        Upper body assist Assist Level:  (mod )      Lower Body Dressing/Undressing Lower body dressing Lower body dressing/undressing activity did not occur: Refused What is the patient wearing?: Pants, Non-skid slipper socks     Pants- Performed by patient: Thread/unthread right pants leg Pants- Performed by helper: Thread/unthread left pants leg, Pull pants up/down   Non-skid slipper socks- Performed by helper: Don/doff right sock, Don/doff left sock  TED Hose - Performed by helper: Don/doff right TED hose, Don/doff left TED hose  Lower body assist Assist for lower body dressing: 2 Helpers      Toileting Toileting Toileting activity did not occur: No continent bowel/bladder event Toileting steps completed by patient: Performs perineal  hygiene Toileting steps completed by helper: Adjust clothing prior to toileting, Adjust clothing after toileting Toileting Assistive Devices: Grab bar or rail  Toileting assist Assist level:  (total )   Transfers Chair/bed transfer   Chair/bed transfer method: Squat pivot Chair/bed transfer assist level: Maximal assist (Pt 25 - 49%/lift and lower) Chair/bed transfer assistive device: Armrests     Locomotion Ambulation Ambulation activity did not occur: Safety/medical concerns   Max distance: 10+5+15 Assist level: 2 helpers (max A +2 for w/c follow)   Wheelchair   Type: Manual (TIS) Max wheelchair distance: 150 Assist Level: Dependent (Pt equals 0%)  Cognition Comprehension Comprehension assist level: Understands basic 50 - 74% of the time/ requires cueing 25 - 49% of the time  Expression Expression assist level: Expresses basic 25 - 49% of the time/requires cueing 50 - 75% of the time. Uses single words/gestures.  Social Interaction Social Interaction assist level: Interacts appropriately 25 - 49% of time - Needs frequent redirection.  Problem Solving Problem solving assist level: Solves basic 25 - 49% of the time - needs direction more than half the time to initiate, plan or complete simple activities  Memory Memory assist level: Recognizes or recalls 25 - 49% of the time/requires cueing 50 - 75% of the time     Medical Problem List and Plan: 1.Left hemiplegia, aphasia, dysphagia secondary to right basal ganglia hemorrhage secondary to hypertensive crisis on 04/01/16.   Continue CIR  PRAFO for left lower extremity and splints for left upper extremity ordered  Fluoxetine 10 started on 5/1, Increased to 20 on 5/8  Per therapies, patient with increased lethargy, medications adjusted,appears to be improving vs. Baseline fluctuations 2. DVT Prophylaxis/Anticoagulation: Subcutaneous heparin for DVT prophylaxis initiated 04/07/2016 3. Pain Management/migraine headaches: Fioricet for  headaches and Robaxin. 4. Dysphagia. #2 thin liquid diet. Speech therapy follow-up. Will advance diet as tolerated  Remeron changed to Marinol on 5/12, will consider further increase in future.   Eating 5-10 % of her meals, slowly improving when food brought from outside. 5. Neuropsych: This patient is not capable of making decisions on her own behalf. 6. Skin/Wound Care: Routine skin checks 7. Fluids/Electrolytes/Nutrition: Routine I&O   BMP within normal range on 5/2 8. Hypertension.   Plendil 10 mg daily, decreased to 2.5 mg on 5/12  Hydralazine 10 mg 3 times a day, DC'd on 5/10.   Lisinopril 20 mg twice a day, increased to 40 mg on 4/28, changed to 40 mg daily on 5/11, decreased to 20 mg daily on [id number]  Bysystolic 20 mg every 12 hours, decreased to 5 mg on 5/9  Will cont to monitor and wean medications 9. Hyperlipidemia. Lipitor 10. Spasticity: Controlled  Continue baclofen 5 mg on 4/27, DC'd on 5/10 11. Thrush:   Treated with Diflucan, DC'd on 5/3  LOS (Days) 16 A FACE TO FACE EVALUATION WAS PERFORMED  Ankit [name] 04/24/2016 9:26 AM

## 2016-04-24 NOTE — Progress Notes (Signed)
Physical Therapy Session Note  Patient Details  Name: Abigail Campos MRN: [id number] Date of Birth: [birth year]  Today's Date: 04/24/2016 PT Individual Time: 1300-1415 PT Individual Time Calculation (min): 75 min   Short Term Goals: Week 3:  PT Short Term Goal 1 (Week 3): Pt will demonstrate static sitting balance x10 minutes with supervision PT Short Term Goal 2 (Week 3): Pt will demonstrate transfers with mod assist in 3/4 observed trials PT Short Term Goal 3 (Week 3): Pt will propel w/c x150' with supervision PT Short Term Goal 4 (Week 3): Pt will demonstrate static standing with min assist   Skilled Therapeutic Interventions/Progress Updates:    Pt received resting in TIS w/c, requesting to use bathroom.  Session focus on gait training, NMR, balance, and transfers. Therapeutic rest breaks provided throughout session as needed to increase focus and decrease fatigue. Pt alerted PT to area of skin breakdown on L thenar region during hand washing, ? Due to wrist splint.  Splint left off during session and PA notified.  PA applied foam dressing and instructed therapist/pt to leave brace off for now.   Stand/pivot w/c<>toilet with grab bar and min assist to stand from w/c and toilet, max assist for pivot to block L knee and facilitate weight shift L.  Pt able to maintain sitting balance during toileting with RUE support and close supervision.    Gait training x15' +15' +15' with R rail in hallway max assist for maintaining upright posture, weight shift, and advancing LLE.  Pt demos inconsistent quad contraction on L during second and third gait trials and requires min/mod verbal cues for sequencing and pacing.    Pt engaged in w/c level activity, reaching for cones in all planes and returning to pile on L for sitting balance, core strengthening, and trunk control.    Pt returned to room at end of session and agreeable to continue sitting OOB in chair.  QRB applied and call bell in pt's lap.     Therapy Documentation Precautions:  Precautions Precautions: Fall Precaution Comments: left hemi, L inattention, impulsive Restrictions Weight Bearing Restrictions: No   See Function Navigator for Current Functional Status.   Therapy/Group: Individual Therapy  Abigail Campos 04/24/2016, 2:39 PM

## 2016-04-24 NOTE — Progress Notes (Signed)
Speech Language Pathology Weekly Progress and Session Note  Patient Details  Name: Abigail Campos MRN: [id number] Date of Birth: [birth year]  Beginning of progress report period: Apr 16, 2016 End of progress report period: Apr 24, 2016  Today's Date: 04/24/2016 SLP Individual Time: 1100-1155 SLP Individual Time Calculation (min): 55 min  Short Term Goals: Week 2: SLP Short Term Goal 1 (Week 2): Patient will consume current diet with minimal overt s/s of aspiration with Min A verbal cues for use of swallowing strategies.  SLP Short Term Goal 1 - Progress (Week 2): Not met SLP Short Term Goal 2 (Week 2): Patient will demonstrate sustained attention to functional tasks for 5 minutes with Min A verbal cues for redirection.  SLP Short Term Goal 2 - Progress (Week 2): Met SLP Short Term Goal 3 (Week 2): Patient will attend to left field of enviornment during functional tasks with Min A verbal ces.  SLP Short Term Goal 3 - Progress (Week 2): Not met SLP Short Term Goal 4 (Week 2): Patient will answer basic yes/no questions with 50% accuracy in regards to wants/needs with Min A multimodal cues.  SLP Short Term Goal 4 - Progress (Week 2): Met SLP Short Term Goal 5 (Week 2): Patient will utilize speech intelligibility strategies at the word level with Mod A verbal cues to achieve 50% intelligibility.,  SLP Short Term Goal 5 - Progress (Week 2): Met SLP Short Term Goal 6 (Week 2): Patient will name common objects with 50% accuracy with Mod A multimodal cues.  SLP Short Term Goal 6 - Progress (Week 2): Met    New Short Term Goals: Week 3: SLP Short Term Goal 1 (Week 3): Patient will consume current diet with minimal overt s/s of aspiration with Min A verbal cues for use of swallowing strategies.  SLP Short Term Goal 2 (Week 3): Patient will demonstrate sustained attention to functional tasks for 15 minutes with Min A verbal cues for redirection.  SLP Short Term Goal 3 (Week 3): Patient will  attend to left field of enviornment during functional tasks with Min A verbal ces.  SLP Short Term Goal 4 (Week 3): Patient will answer basic yes/no questions with 75% accuracy in regards to wants/needs with Min A multimodal cues.  SLP Short Term Goal 5 (Week 3): Patient will name common objects with 75% accuracy with Min A multimodal cues.  SLP Short Term Goal 6 (Week 3): Patient will self-monitor and correct phonemic errors at the word level in 50% of opportunities with Max A verbal cues.   Weekly Progress Updates: Patient has made functional gains and has met 4 of 6 STG's this reporting period due to increased sustained attention and functional communication. Currently, patient requires overall Mod A to complete functional and familiar tasks safely in regards to recall, problem solving, attention to left field of environment and awareness but demonstrates increased sustained attention to tasks and only requires Min A verbal cues for redirection. Patient also requires overall Mod A multimodal cues for auditory comprehension of basic information and Mod-Max A multimodal cues for verbal expression at the phrase level. Patient is currently consuming Dys. 2 textures with thin liquids without overt s/s of aspiration but requires Mod A multimodal cues for use of swallowing compensatory strategies. Patient is consuming trials of Dys. 3 textures but is not ready to upgrade due to impulsivity with impaired mastication. Patient and family education is ongoing. Patient would benefit from continued skilled SLP intervention to maximize her cognitive  and swallowing function and functional communication in order to maximize her overall functional independence prior to discharge.     Intensity: Minumum of 1-2 x/day, 30 to 90 minutes Frequency: 3 to 5 out of 7 days Duration/Length of Stay: 5/24 Treatment/Interventions: Cognitive remediation/compensation;Cueing hierarchy;Dysphagia/aspiration precaution  training;Functional tasks;Patient/family education;Therapeutic Activities;Speech/Language facilitation;Environmental controls;Internal/external aids   Daily Session  Skilled Therapeutic Interventions: Skilled treatment session focused on dysphagia and functional communication goals. SLP facilitated session by providing skilled observation with trials of Dys. 3 textures. Patient demonstrated minimal bolus manipulation and mastication with intermittent left anterior spillage in which patient required Max verbal cues to self-monitor and correct and Mod A verbal cues for use of small bites. Recommend patient continue current diet. Patient also consumed her lunch meal of Dys. 2 textures without overt s/s of aspiration and required Max A verbal cues for use of small bites and to self-monitor and correct left anterior spillage. Patient required Mod-Max A multimodal cues to answer yes/no questions and to make choices from a field of 2. Patient also with frequent phonemic paraphasias in which she required Max A visual and phonemic cues to self-monitor and correct. Patient left upright in wheelchair with quick release belt in place and daughter present. Continue with current plan of care.      Function:   Eating Eating Eating activity did not occur: N/A Modified Consistency Diet: Yes Eating Assist Level: Set up assist for;Supervision or verbal cues;Helper checks for pocketed food;Helper scoops food on utensil   Eating Set Up Assist For: Opening containers Helper [place] on Utensil: Occasionally Helper [place] to Mouth: Occasionally   Cognition Comprehension Comprehension assist level: Understands basic 50 - 74% of the time/ requires cueing 25 - 49% of the time  Expression   Expression assist level: Expresses basic 25 - 49% of the time/requires cueing 50 - 75% of the time. Uses single words/gestures.  Social Interaction Social Interaction assist level: Interacts appropriately 25 - 49% of time -  Needs frequent redirection.  Problem Solving Problem solving assist level: Solves basic 25 - 49% of the time - needs direction more than half the time to initiate, plan or complete simple activities  Memory Memory assist level: Recognizes or recalls 25 - 49% of the time/requires cueing 50 - 75% of the time   Pain No/Denies Pain   Therapy/Group: Individual Therapy  Marlayna Bannister 04/24/2016, 4:00 PM

## 2016-04-25 ENCOUNTER — Inpatient Hospital Stay (HOSPITAL_COMMUNITY): Payer: Managed Care, Other (non HMO) | Admitting: Physical Therapy

## 2016-04-25 DIAGNOSIS — R51 Headache: Secondary | ICD-10-CM

## 2016-04-25 LAB — URINE CULTURE

## 2016-04-25 MED ORDER — DIVALPROEX SODIUM 250 MG PO DR TAB
250.0000 mg | DELAYED_RELEASE_TABLET | Freq: Two times a day (BID) | ORAL | Status: DC
  Administered 2016-04-25 – 2016-04-26 (×4): 250 mg via ORAL
  Filled 2016-04-25 (×4): qty 1

## 2016-04-25 NOTE — Progress Notes (Addendum)
Physical Therapy Session Note  Patient Details  Name: Abigail Campos MRN: [id number] Date of Birth: [birth year]  Today's Date: 04/25/2016 PT Individual Time: 0919-0950 PT Individual Time Calculation (min): 31 min   Short Term Goals: Week 3:  PT Short Term Goal 1 (Week 3): Pt will demonstrate static sitting balance x10 minutes with supervision PT Short Term Goal 2 (Week 3): Pt will demonstrate transfers with mod assist in 3/4 observed trials PT Short Term Goal 3 (Week 3): Pt will propel w/c x150' with supervision PT Short Term Goal 4 (Week 3): Pt will demonstrate static standing with min assist   Skilled Therapeutic Interventions/Progress Updates:   Pt received in bed with husband present.  Pt performed supine > sit with min-mod A to advance LLE to EOB and to sit fully upright and weight shift to R.  During squat pivot transfer with max A to tilt in space w/c pt noted to be incontinent of urine.  Rest of session to focus on sit <> stand sequence, static standing at sink with RUE support, dynamic sitting balance, trunk control and weight shifting on BSC to urinate (pt able to have continent void while on BSC) and in w/c without back support to allow pt and husband to perform sink bath and change into clean clothing.  Throughout session pt required from supervision-min-mod A to maintain balance during dynamic activities, max A for sit <> stand from [facility] and w/c and min-mod A for static standing with therapist on L side to provide facilitation at L pelvis and knee for weight shifting, weight acceptance and activation of LLE for stabilization.  At end of session pt positioned in tilt in space w/c with LUE supported and husband present to supervise.    Therapy Documentation Precautions:  Precautions Precautions: Fall Precaution Comments: left hemi, L inattention, impulsive Restrictions Weight Bearing Restrictions: No Pain: Pain Assessment Pain Assessment: No/denies pain   See Function  Navigator for Current Functional Status.   Therapy/Group: Individual Therapy  [name] [facility] 04/25/2016, 12:21 PM

## 2016-04-25 NOTE — Progress Notes (Signed)
[place] PHYSICAL MEDICINE & REHABILITATION     PROGRESS NOTE  Subjective/Complaints:  States she didn't sleep well. i asked why and she said because of her headache---stated it was/is a 10/10 intensity  ROS: Unable to obtain due to mental condition.  Objective: Vital Signs: Blood pressure 101/72, pulse 70, temperature 98.6 F (37 C), temperature source Oral, resp. rate 17, weight 66.9 kg (147 lb 7.8 oz), SpO2 100 %. No results found. No results for input(s): WBC, HGB, HCT, PLT in the last 72 hours. No results for input(s): NA, K, CL, GLUCOSE, BUN, CREATININE, CALCIUM in the last 72 hours.  Invalid input(s): CO CBG (last 3)  No results for input(s): GLUCAP in the last 72 hours.  Wt Readings from Last 3 Encounters:  04/24/16 66.9 kg (147 lb 7.8 oz)  04/06/16 73.6 kg (162 lb 4.1 oz)  01/22/16 74.9 kg (165 lb 2 oz)    Physical Exam:  BP 101/72 mmHg  Pulse 70  Temp(Src) 98.6 F (37 C) (Oral)  Resp 17  Wt 66.9 kg (147 lb 7.8 oz)  SpO2 100% Constitutional: She appears well-developed. Obese. NAD. Vital signs reviewed. HENT:  Normocephalic and atraumatic.  Eyes: Conjunctivae and EOM are normal.     Cardiovascular: Normal rate and regular rhythm.    Respiratory: Effort normal and breath sounds normal. No respiratory distress.   GI: Soft. Bowel sounds are normal. She exhibits no distension.  Musculoskeletal: She exhibits no edema or no tenderness.  Neurological: She is alert.  Inconsistently follows commands Expressive And receptive aphasia, Perseverative  Dysarthria Left facial weakness Motor: Moving right upper and right lower extremity freely.  Not moving left side.  MAS 0/4 Elbow flexion In left upper extremity (previously 1/4) Skin: Skin is warm and dry.  Psychiatric:  Flat, tearful at times  Assessment/Plan: 1. Functional deficits secondary to right basal ganglia hemorrhage which require 3+ hours per day of interdisciplinary therapy in a comprehensive inpatient  rehab setting. Physiatrist is providing close team supervision and 24 hour management of active medical problems listed below. Physiatrist and rehab team continue to assess barriers to discharge/monitor patient progress toward functional and medical goals.  Function:  Bathing Bathing position Bathing activity did not occur: Refused Position: Shower  Bathing parts Body parts bathed by patient: Chest, Abdomen, Left arm, Right upper leg, Left upper leg, Front perineal area Body parts bathed by helper: Right arm, Buttocks, Right lower leg, Left lower leg, Front perineal area, Back  Bathing assist Assist Level: 2 helpers      Upper Body Dressing/Undressing Upper body dressing Upper body dressing/undressing activity did not occur: Refused What is the patient wearing?: Pull over shirt/dress, Bra Bra - Perfomed by patient: Thread/unthread right bra strap Bra - Perfomed by helper: Thread/unthread left bra strap, Hook/unhook bra (pull down sports bra) Pull over shirt/dress - Perfomed by patient: Thread/unthread right sleeve, Put head through opening Pull over shirt/dress - Perfomed by helper: Thread/unthread left sleeve, Pull shirt over trunk        Upper body assist Assist Level: Touching or steadying assistance(Pt > 75%)      Lower Body Dressing/Undressing Lower body dressing Lower body dressing/undressing activity did not occur: Refused What is the patient wearing?: Pants, Non-skid slipper socks     Pants- Performed by patient: Thread/unthread right pants leg Pants- Performed by helper: Thread/unthread left pants leg, Pull pants up/down, Thread/unthread right pants leg   Non-skid slipper socks- Performed by helper: Don/doff right sock, Don/doff left sock  TED Hose - Performed by helper: Don/doff right TED hose, Don/doff left TED hose  Lower body assist Assist for lower body dressing: 2 Helpers      Toileting Toileting Toileting activity did not occur: No  continent bowel/bladder event Toileting steps completed by patient: Performs perineal hygiene Toileting steps completed by helper: Adjust clothing prior to toileting, Adjust clothing after toileting Toileting Assistive Devices:  (bedside commode)  Toileting assist Assist level: Two helpers   Transfers Chair/bed transfer   Chair/bed transfer method: Stand pivot Chair/bed transfer assist level: Maximal assist (Pt 25 - 49%/lift and lower) Chair/bed transfer assistive device: Armrests     Locomotion Ambulation Ambulation activity did not occur: Safety/medical concerns   Max distance: 15+15+15 Assist level: 2 helpers (max assist, +2 for w/c follow)   Wheelchair   Type: Manual (TIS) Max wheelchair distance: 150 Assist Level: Dependent (Pt equals 0%)  Cognition Comprehension Comprehension assist level: Follows complex conversation/direction with no assist  Expression Expression assist level: Expresses basic 25 - 49% of the time/requires cueing 50 - 75% of the time. Uses single words/gestures.  Social Interaction Social Interaction assist level: Interacts appropriately 25 - 49% of time - Needs frequent redirection.  Problem Solving Problem solving assist level: Solves basic 25 - 49% of the time - needs direction more than half the time to initiate, plan or complete simple activities  Memory Memory assist level: Recognizes or recalls 25 - 49% of the time/requires cueing 50 - 75% of the time     Medical Problem List and Plan: 1.Left hemiplegia, aphasia, dysphagia secondary to right basal ganglia hemorrhage secondary to hypertensive crisis on 04/01/16.   Continue CIR therapies  PRAFO for left lower extremity and splints for left upper extremity ordered  Fluoxetine 10 started on 5/1, Increased to 20 on 5/8  Per therapies, patient with increased lethargy, medications adjusted,appears to be improving vs. Baseline fluctuations 2. DVT Prophylaxis/Anticoagulation: Subcutaneous heparin for DVT  prophylaxis initiated 04/07/2016 3. Pain Management/migraine headaches: Fioricet for headaches and Robaxin.  -add low dose depakote, 250mg  bid--may help with appetite too 4. Dysphagia. #2 thin liquid diet. Speech therapy follow-up. Will advance diet as tolerated  Remeron changed to Marinol on 5/12, will consider further increase in future.   Eating 5-10 % of her meals, slowly improving when food brought from outside. 5. Neuropsych: This patient is not capable of making decisions on her own behalf. 6. Skin/Wound Care: Routine skin checks 7. Fluids/Electrolytes/Nutrition: Routine I&O   BMP within normal range on 5/2 8. Hypertension.   Plendil 10 mg daily, decreased to 2.5 mg on 5/12  Hydralazine 10 mg 3 times a day, DC'd on 5/10.   Lisinopril 20 mg twice a day, increased to 40 mg on 4/28, changed to 40 mg daily on 5/11, decreased to 20 mg daily on [id number]  Bysystolic 20 mg every 12 hours, decreased to 5 mg on 5/9  Will cont to monitor and wean medications 9. Hyperlipidemia. Lipitor 10. Spasticity: Controlled  Continue baclofen 5 mg on 4/27, DC'd on 5/10 11. Thrush:   Treated with Diflucan, DC'd on 5/3  LOS (Days) 17 A FACE TO FACE EVALUATION WAS PERFORMED  Kaenan Jake T 04/25/2016 9:07 AM

## 2016-04-26 ENCOUNTER — Inpatient Hospital Stay (HOSPITAL_COMMUNITY): Payer: Managed Care, Other (non HMO) | Admitting: Physical Therapy

## 2016-04-26 NOTE — Progress Notes (Signed)
[place] PHYSICAL MEDICINE & REHABILITATION     PROGRESS NOTE  Subjective/Complaints:  Seems in better spirits today. Indicates that depakote helped her headaches  ROS: full ROS limited due to language.  Objective: Vital Signs: Blood pressure 100/68, pulse 72, temperature 98 F (36.7 C), temperature source Oral, resp. rate 20, weight 66.9 kg (147 lb 7.8 oz), SpO2 100 %. No results found. No results for input(s): WBC, HGB, HCT, PLT in the last 72 hours. No results for input(s): NA, K, CL, GLUCOSE, BUN, CREATININE, CALCIUM in the last 72 hours.  Invalid input(s): CO CBG (last 3)  No results for input(s): GLUCAP in the last 72 hours.  Wt Readings from Last 3 Encounters:  04/24/16 66.9 kg (147 lb 7.8 oz)  04/06/16 73.6 kg (162 lb 4.1 oz)  01/22/16 74.9 kg (165 lb 2 oz)    Physical Exam:  BP 100/68 mmHg  Pulse 72  Temp(Src) 98 F (36.7 C) (Oral)  Resp 20  Wt 66.9 kg (147 lb 7.8 oz)  SpO2 100% Constitutional: She appears well-developed.  NAD HENT:  Normocephalic and atraumatic.  Eyes: Conjunctivae and EOM are normal.     Cardiovascular: Normal rate and regular rhythm.    Respiratory: Effort normal and breath sounds normal. No respiratory distress.   GI: Soft. Bowel sounds are normal. She exhibits no distension.  Musculoskeletal: She exhibits no edema or no tenderness.  Neurological: She is alert.  Inconsistently follows commands Expressive And receptive aphasia, Perseverative  Dysarthria Left facial weakness Motor: Moving right upper and right lower extremity freely.  Not moving left side.  MAS 0/4 Elbow flexion In left upper extremity (previously 1/4) Skin: Skin is warm and dry.  Psychiatric:  Smiling, in good spirits  Assessment/Plan: 1. Functional deficits secondary to right basal ganglia hemorrhage which require 3+ hours per day of interdisciplinary therapy in a comprehensive inpatient rehab setting. Physiatrist is providing close team supervision and 24 hour  management of active medical problems listed below. Physiatrist and rehab team continue to assess barriers to discharge/monitor patient progress toward functional and medical goals.  Function:  Bathing Bathing position Bathing activity did not occur: Refused Position: Shower  Bathing parts Body parts bathed by patient: Chest, Abdomen, Left arm, Right upper leg, Left upper leg, Front perineal area Body parts bathed by helper: Right arm, Buttocks, Right lower leg, Left lower leg, Front perineal area, Back  Bathing assist Assist Level: 2 helpers      Upper Body Dressing/Undressing Upper body dressing Upper body dressing/undressing activity did not occur: Refused What is the patient wearing?: Pull over shirt/dress, Bra Bra - Perfomed by patient: Thread/unthread right bra strap Bra - Perfomed by helper: Thread/unthread left bra strap, Hook/unhook bra (pull down sports bra) Pull over shirt/dress - Perfomed by patient: Thread/unthread right sleeve, Put head through opening Pull over shirt/dress - Perfomed by helper: Thread/unthread left sleeve, Pull shirt over trunk        Upper body assist Assist Level: Touching or steadying assistance(Pt > 75%)      Lower Body Dressing/Undressing Lower body dressing Lower body dressing/undressing activity did not occur: Refused What is the patient wearing?: Pants, Non-skid slipper socks     Pants- Performed by patient: Thread/unthread right pants leg Pants- Performed by helper: Thread/unthread left pants leg, Pull pants up/down, Thread/unthread right pants leg   Non-skid slipper socks- Performed by helper: Don/doff right sock, Don/doff left sock               TED Hose -  Performed by helper: Don/doff right TED hose, Don/doff left TED hose  Lower body assist Assist for lower body dressing: 2 Helpers      Toileting Toileting Toileting activity did not occur: No continent bowel/bladder event Toileting steps completed by patient: Performs  perineal hygiene Toileting steps completed by helper: Adjust clothing prior to toileting, Adjust clothing after toileting Toileting Assistive Devices:  (bedside commode)  Toileting assist Assist level: Two helpers   Transfers Chair/bed transfer   Chair/bed transfer method: Squat pivot Chair/bed transfer assist level: Maximal assist (Pt 25 - 49%/lift and lower) Chair/bed transfer assistive device: Armrests     Locomotion Ambulation Ambulation activity did not occur: Safety/medical concerns   Max distance: 15+15+15 Assist level: 2 helpers (max assist, +2 for w/c follow)   Wheelchair   Type: Manual (TIS) Max wheelchair distance: 150 Assist Level: Dependent (Pt equals 0%)  Cognition Comprehension Comprehension assist level: Follows complex conversation/direction with no assist  Expression Expression assist level: Expresses basic 25 - 49% of the time/requires cueing 50 - 75% of the time. Uses single words/gestures.  Social Interaction Social Interaction assist level: Interacts appropriately 25 - 49% of time - Needs frequent redirection.  Problem Solving Problem solving assist level: Solves basic 25 - 49% of the time - needs direction more than half the time to initiate, plan or complete simple activities  Memory Memory assist level: Recognizes or recalls 25 - 49% of the time/requires cueing 50 - 75% of the time     Medical Problem List and Plan: 1.Left hemiplegia, aphasia, dysphagia secondary to right basal ganglia hemorrhage secondary to hypertensive crisis on 04/01/16.   Continue CIR therapies  PRAFO for left lower extremity and splints for left upper extremity ordered  Fluoxetine 10 started on 5/1, Increased to 20 on 5/8  Has appeared quite alert over the weekend 2. DVT Prophylaxis/Anticoagulation: Subcutaneous heparin for DVT prophylaxis initiated 04/07/2016 3. Pain Management/migraine headaches: Fioricet for headaches and Robaxin.  -added low dose depakote, 250mg  bid--may help  with appetite too 4. Dysphagia. #2 thin liquid diet. Speech therapy follow-up. Will advance diet as tolerated  Remeron changed to Marinol on 5/12, will consider further increase in future.   Ate 25-100% yesterday. 5. Neuropsych: This patient is not capable of making decisions on her own behalf. 6. Skin/Wound Care: Routine skin checks 7. Fluids/Electrolytes/Nutrition: Routine I&O   BMP within normal range on 5/2 8. Hypertension. BP's still quite low  Plendil 10 mg daily, decreased to 2.5 mg on 5/12-----will d/c today  Hydralazine 10 mg 3 times a day, DC'd on 5/10.   Lisinopril 20 mg twice a day, increased to 40 mg on 4/28, changed to 40 mg daily on 5/11, decreased to 20 mg daily on [id number]  Bysystolic 20 mg every 12 hours, decreased to 5 mg on 5/9    9. Hyperlipidemia. Lipitor 10. Spasticity: Controlled  Continue baclofen 5 mg on 4/27, DC'd on 5/10 11. Thrush:   Treated with Diflucan, DC'd on 5/3  LOS (Days) 18 A FACE TO FACE EVALUATION WAS PERFORMED  Priscilla Finklea T 04/26/2016 8:34 AM

## 2016-04-26 NOTE — Progress Notes (Addendum)
Physical Therapy Session Note  Patient Details  Name: Abigail Campos MRN: [id number] Date of Birth: 1971-02-21  Today's Date: 04/26/2016 PT Individual Time: [id number] PT Individual Time Calculation (min): 42 min   Skilled Therapeutic Interventions/Progress Updates:    Pt received in bed, with family present & agreeable to PT. Pt transferred supine>sitting EOB with use of bed features but difficulty progressing LLE off of edge of bed, requiring Mod A to transfer to sitting. Once sitting at EOB pt impulsively tried to transfer bed>w/c before therapist ready to do so. Once PT & pt set up for transfer pt able to complete squat pivot bed>w/c with +2 A for safety. Pt with posterior lean during transfer requiring 2nd set of hands to recorrect posture. Pt transported room>hallway outside of gym, and there pt able to transfer sit>stand with Min A with use of rail in hallway. Gait training 15 ft + 15 ft with R rail, Max A from PT & w/c follow. Pt with poor L quad activation during initial trial but slight activation noted during second attempt when weight bearing through that limb. Pt with L knee hyperextension & buckling requiring manual facilitation from PT to prevent from happening. Pt unable to initiate step with LLE and would rearrange hand placement on rail, pushing herself forwards and to L with each step. Therapist educated pt to advance RUE & maintain position. After gait training pt noted L shoulder pain but did not rate; RN notified & administered medication. Pt transferred w/c>mat table with +2 assist & performed reaching L & R outside of base of support with RUE to obtain & toss horseshoes. Pt with challenges to balance when reaching to L but able to self correct & return to midline without assistance. Therapist educated pt on head/buttocks relationship & importance of anterior weight shift during transfer. Pt then able to transfer mat>w/c with +2 assist but improving anterior weight shift. In [facility]  gym utilized dynavision with RUE from w/c level, with pt having difficulty pressing correct button & instead pressing the one beside of it. Attempted to ask pt about vision but pt with little communication. Pt returned to room & left in TIS w/c with QRB in place, call bell in hand, & family present to supervise.   Therapy Documentation Precautions:  Precautions Precautions: Fall Precaution Comments: left hemi, L inattention, impulsive Restrictions Weight Bearing Restrictions: No  Pain: Pain Assessment Pain Assessment:  (did not rate) Pain Location: Shoulder Pain Orientation: Left Pain Intervention(s): RN made aware;Repositioned   See Function Navigator for Current Functional Status.   Therapy/Group: Individual Therapy  Waunita Schooner 04/26/2016, 2:23 PM

## 2016-04-27 ENCOUNTER — Inpatient Hospital Stay (HOSPITAL_COMMUNITY): Payer: Managed Care, Other (non HMO) | Admitting: Occupational Therapy

## 2016-04-27 ENCOUNTER — Inpatient Hospital Stay (HOSPITAL_COMMUNITY): Payer: Managed Care, Other (non HMO) | Admitting: Physical Therapy

## 2016-04-27 ENCOUNTER — Inpatient Hospital Stay (HOSPITAL_COMMUNITY): Payer: Managed Care, Other (non HMO) | Admitting: Speech Pathology

## 2016-04-27 LAB — CBC WITH DIFFERENTIAL/PLATELET
Basophils Absolute: 0 10*3/uL (ref 0.0–0.1)
Basophils Relative: 0 %
Eosinophils Absolute: 0.1 10*3/uL (ref 0.0–0.7)
Eosinophils Relative: 1 %
HCT: 37 % (ref 36.0–46.0)
Hemoglobin: 11.7 g/dL — ABNORMAL LOW (ref 12.0–15.0)
Lymphocytes Relative: 35 %
Lymphs Abs: 1.9 10*3/uL (ref 0.7–4.0)
MCH: 29.3 pg (ref 26.0–34.0)
MCHC: 31.6 g/dL (ref 30.0–36.0)
MCV: 92.5 fL (ref 78.0–100.0)
Monocytes Absolute: 0.7 10*3/uL (ref 0.1–1.0)
Monocytes Relative: 12 %
Neutro Abs: 2.8 10*3/uL (ref 1.7–7.7)
Neutrophils Relative %: 52 %
Platelets: 207 10*3/uL (ref 150–400)
RBC: 4 MIL/uL (ref 3.87–5.11)
RDW: 13.3 % (ref 11.5–15.5)
WBC: 5.4 10*3/uL (ref 4.0–10.5)

## 2016-04-27 LAB — BASIC METABOLIC PANEL
Anion gap: 7 (ref 5–15)
BUN: 22 mg/dL — ABNORMAL HIGH (ref 6–20)
CO2: 26 mmol/L (ref 22–32)
Calcium: 9.9 mg/dL (ref 8.9–10.3)
Chloride: 102 mmol/L (ref 101–111)
Creatinine, Ser: 1.25 mg/dL — ABNORMAL HIGH (ref 0.44–1.00)
GFR calc Af Amer: >60 mL/min (ref >60)
GFR calc non Af Amer: 52 mL/min — ABNORMAL LOW (ref >60)
Glucose, Bld: 125 mg/dL — ABNORMAL HIGH (ref 65–99)
Potassium: 4.2 mmol/L (ref 3.5–5.1)
Sodium: 135 mmol/L (ref 135–145)

## 2016-04-27 MED ORDER — LISINOPRIL 5 MG PO TABS
5.0000 mg | ORAL_TABLET | Freq: Every day | ORAL | Status: DC
  Administered 2016-04-28: 5 mg via ORAL
  Filled 2016-04-27: qty 1

## 2016-04-27 NOTE — Progress Notes (Signed)
Speech Language Pathology Daily Session Note  Patient Details  Name: Abigail Campos MRN: [id number] Date of Birth: Nov 09, 1971  Today's Date: 04/27/2016 SLP Individual Time: 1100-1200 SLP Individual Time Calculation (min): 60 min  Short Term Goals: Week 3: SLP Short Term Goal 1 (Week 3): Patient will consume current diet with minimal overt s/s of aspiration with Min A verbal cues for use of swallowing strategies.  SLP Short Term Goal 2 (Week 3): Patient will demonstrate sustained attention to functional tasks for 15 minutes with Min A verbal cues for redirection.  SLP Short Term Goal 3 (Week 3): Patient will attend to left field of enviornment during functional tasks with Min A verbal ces.  SLP Short Term Goal 4 (Week 3): Patient will answer basic yes/no questions with 75% accuracy in regards to wants/needs with Min A multimodal cues.  SLP Short Term Goal 5 (Week 3): Patient will name common objects with 75% accuracy with Min A multimodal cues.  SLP Short Term Goal 6 (Week 3): Patient will self-monitor and correct phonemic errors at the word level in 50% of opportunities with Max A verbal cues.   Skilled Therapeutic Interventions: Skilled treatment session focused on cognitive-linguistic and dysphagia goals. SLP facilitated session by providing Min A question cues for patient to self-monitor and correct errors during a basic 4 step picture sequencing task. SLP also facilitated session by providing Mod A phonemic and semantic cues for naming and Max A multimodal cues to self-monitor and correct verbal errors as well as increase her length of utterance past 1-2 words during a semi-structured picture description task. Patient consumed her lunch meal of Dys. 2 textures with thin liquids without overt s/s of aspiration and required Mod A verbal cues for use of small bites and Max A verbal cues to self-monitor and correct left anterior spillage. Patient independently requested to use the bathroom and  was able to successfully void while on the commode. Patient transferred back to bed at end of session and left with all needs within reach. Continue with current plan of care.    Function:  Eating Eating   Modified Consistency Diet: Yes Eating Assist Level: Set up assist for;Supervision or verbal cues;Helper checks for pocketed food   Eating Set Up Assist For: Opening containers       Cognition Comprehension Comprehension assist level: Understands basic 75 - 89% of the time/ requires cueing 10 - 24% of the time  Expression   Expression assist level: Expresses basic 25 - 49% of the time/requires cueing 50 - 75% of the time. Uses single words/gestures.  Social Interaction Social Interaction assist level: Interacts appropriately 75 - 89% of the time - Needs redirection for appropriate language or to initiate interaction.  Problem Solving Problem solving assist level: Solves basic 25 - 49% of the time - needs direction more than half the time to initiate, plan or complete simple activities  Memory Memory assist level: Recognizes or recalls 25 - 49% of the time/requires cueing 50 - 75% of the time    Pain No/Denies Pain   Therapy/Group: Individual Therapy  Tuff Clabo, [place] 04/27/2016, 1:36 PM

## 2016-04-27 NOTE — Progress Notes (Signed)
[place] [facility] MEDICINE & REHABILITATION     PROGRESS NOTE  Subjective/Complaints:  Patient lying in bed this morning. She appears to suggest that her weekend was not a typical weekend because she is in the hospital. Her aphasia seems improving  ROS: full ROS limited due to language.  Objective: Vital Signs: Blood pressure 89/57, pulse 63, temperature 98.4 F (36.9 C), temperature source Oral, resp. rate 20, weight 66.9 kg (147 lb 7.8 oz), SpO2 100 %. No results found. No results for input(s): WBC, HGB, HCT, PLT in the last 72 hours. No results for input(s): NA, K, CL, GLUCOSE, BUN, CREATININE, CALCIUM in the last 72 hours.  Invalid input(s): CO CBG (last 3)  No results for input(s): GLUCAP in the last 72 hours.  Wt Readings from Last 3 Encounters:  04/24/16 66.9 kg (147 lb 7.8 oz)  04/06/16 73.6 kg (162 lb 4.1 oz)  01/22/16 74.9 kg (165 lb 2 oz)    Physical Exam:  BP 89/57 mmHg  Pulse 63  Temp(Src) 98.4 F (36.9 C) (Oral)  Resp 20  Wt 66.9 kg (147 lb 7.8 oz)  SpO2 100% Constitutional: She appears well-developed.  NAD HENT:  Normocephalic and atraumatic.  Eyes: Conjunctivae and EOM are normal.     Cardiovascular: Normal rate and regular rhythm.    Respiratory: Effort normal and breath sounds normal. No respiratory distress.   GI: Soft. Bowel sounds are normal. She exhibits no distension.  Musculoskeletal: She exhibits no edema or no tenderness.  Neurological: She is alert.  Inconsistently follows commands Expressive And receptive aphasia, Perseverative, Improving  Dysarthria Left facial weakness Motor: Moving right upper and right lower extremity freely.  Not moving left side.  Skin: Skin is warm and dry.  Psychiatric:  Smiling, in good spirits  Assessment/Plan: 1. Functional deficits secondary to right basal ganglia hemorrhage which require 3+ hours per day of interdisciplinary therapy in a comprehensive inpatient rehab setting. Physiatrist is providing  close team supervision and 24 hour management of active medical problems listed below. Physiatrist and rehab team continue to assess barriers to discharge/monitor patient progress toward functional and medical goals.  Function:  Bathing Bathing position Bathing activity did not occur: Refused Position: Shower  Bathing parts Body parts bathed by patient: Chest, Abdomen, Left arm, Right upper leg, Left upper leg, Front perineal area Body parts bathed by helper: Right arm, Buttocks, Right lower leg, Left lower leg, Front perineal area, Back  Bathing assist Assist Level: 2 helpers      Upper Body Dressing/Undressing Upper body dressing Upper body dressing/undressing activity did not occur: Refused What is the patient wearing?: Pull over shirt/dress, Bra Bra - Perfomed by patient: Thread/unthread right bra strap Bra - Perfomed by helper: Thread/unthread left bra strap, Hook/unhook bra (pull down sports bra) Pull over shirt/dress - Perfomed by patient: Thread/unthread right sleeve, Put head through opening Pull over shirt/dress - Perfomed by helper: Thread/unthread left sleeve, Pull shirt over trunk        Upper body assist Assist Level: Touching or steadying assistance(Pt > 75%)      Lower Body Dressing/Undressing Lower body dressing Lower body dressing/undressing activity did not occur: Refused What is the patient wearing?: Pants, Non-skid slipper socks     Pants- Performed by patient: Thread/unthread right pants leg Pants- Performed by helper: Thread/unthread left pants leg, Pull pants up/down, Thread/unthread right pants leg   Non-skid slipper socks- Performed by helper: Don/doff right sock, Don/doff left sock  TED Hose - Performed by helper: Don/doff right TED hose, Don/doff left TED hose  Lower body assist Assist for lower body dressing: 2 Helpers      Toileting Toileting Toileting activity did not occur: No continent bowel/bladder event Toileting steps  completed by patient: Performs perineal hygiene Toileting steps completed by helper: Adjust clothing prior to toileting, Adjust clothing after toileting Toileting Assistive Devices:  (bedside commode)  Toileting assist Assist level: Two helpers   Transfers Chair/bed transfer   Chair/bed transfer method: Squat pivot Chair/bed transfer assist level: 2 helpers Chair/bed transfer assistive device: Armrests     Locomotion Ambulation Ambulation activity did not occur: Safety/medical concerns   Max distance: 15 ft + 50ft Assist level: 2 helpers (Max A + w/c follow)   Wheelchair   Type: Manual (TIS) Max wheelchair distance: 150 Assist Level: Dependent (Pt equals 0%)  Cognition Comprehension Comprehension assist level: Follows complex conversation/direction with no assist  Expression Expression assist level: Expresses basic 25 - 49% of the time/requires cueing 50 - 75% of the time. Uses single words/gestures.  Social Interaction Social Interaction assist level: Interacts appropriately 25 - 49% of time - Needs frequent redirection.  Problem Solving Problem solving assist level: Solves basic 25 - 49% of the time - needs direction more than half the time to initiate, plan or complete simple activities  Memory Memory assist level: Recognizes or recalls 25 - 49% of the time/requires cueing 50 - 75% of the time     Medical Problem List and Plan: 1.Left hemiplegia, aphasia, dysphagia secondary to right basal ganglia hemorrhage secondary to hypertensive crisis on 04/01/16.   Continue CIR therapies  PRAFO for left lower extremity and splints for left upper extremity ordered  Fluoxetine 10 started on 5/1, Increased to 20 on 5/8 2. DVT Prophylaxis/Anticoagulation: Subcutaneous heparin for DVT prophylaxis initiated 04/07/2016 3. Pain Management/migraine headaches: Fioricet for headaches and Robaxin. 4. Dysphagia. #2 thin liquid diet. Speech therapy follow-up. Will advance diet as tolerated  Remeron  changed to Marinol on 5/12, will consider further increase in future.   Eating 25-100% of meals . 5. Neuropsych: This patient is not capable of making decisions on her own behalf. 6. Skin/Wound Care: Routine skin checks 7. Fluids/Electrolytes/Nutrition: Routine I&O   BMP within normal range on 5/2  Labs pending for today 8. Hypertension now with hypotension,   Plendil 10 mg daily DC'd on 5/15  Hydralazine 10 mg 3 times a day, DC'd on 5/10   Lisinopril 20 mg twice a day, increased to 40 mg on 4/28, changed to 40 mg daily on 5/11, decreased to 20 mg daily on 5/12, decreased to 5 mg on [id number]  Bysystolic 20 mg every 12 hours, decreased to 5 mg on 5/9   Will cont to monitor and wean medications 9. Hyperlipidemia. Lipitor 10. Spasticity: Controlled  Continue baclofen 5 mg on 4/27, DC'd on 5/10 11. Thrush:   Treated with Diflucan, DC'd on 5/3  LOS (Days) 19 A FACE TO FACE EVALUATION WAS PERFORMED  Abigail Campos [name] 04/27/2016 9:39 AM

## 2016-04-27 NOTE — Progress Notes (Signed)
Physical Therapy Session Note  Patient Details  Name: Abigail Campos MRN: [id number] Date of Birth: December 30, 1970  Today's Date: 04/27/2016 PT Individual Time: 7564-3329 PT Individual Time Calculation (min): 73 min   Short Term Goals: Week 3:  PT Short Term Goal 1 (Week 3): Pt will demonstrate static sitting balance x10 minutes with supervision PT Short Term Goal 2 (Week 3): Pt will demonstrate transfers with mod assist in 3/4 observed trials PT Short Term Goal 3 (Week 3): Pt will propel w/c x150' with supervision PT Short Term Goal 4 (Week 3): Pt will demonstrate static standing with min assist   Skilled Therapeutic Interventions/Progress Updates:    Pt received resting in bed, c/o upset stomach but cannot elaborate (? Constipation), agreeable to therapy.  Pt's husband, Abigail Campos, present to initiate family training.  Session focus on family education, static and dynamic standing balance, forced use of LUE/LLE, transfers, gait, and NMR.    PT instructed pt's husband in providing assist for pt to transfer supine>sit at EOB with assist to maneuver LLE to EOB and allowing pt to pull up on his hand to come to sitting.  Abigail Campos correctly guarded pt while sitting EOB without cues.  PT instructed Abigail Campos in providing mod/max assist for squat/pivot transfer to pt's right from EOB>w/c which Abigail Campos was able to demonstrate with verbal cues for correct set up and form.    Static standing at edge of raised therapy mat with PT providing mod assist for pt to maintain standing, facilitating at L knee/hip, and providing weight bearing facilitation through L hand.  Pt engaged in simple sorting task during static standing with min verbal cues.  Dynamic standing at edge of mat with PT providing same facilitation/cues as pt reached anteriorly and L outside BOS for cups with active quad engagement noted in 40% of trials.  Pt continues to require mod/max assist for dynamic standing balance.  Pt performed 2x2 trunk rotation stretches  focus on return to midline with trace activation of L hip adductors noted.  Pt performed 2x8 bridges with ball adduction for increased challenge.    Gait training x15' +10' +30' at rail in hallway with max assist for LLE advancement and max multimodal cues for sequencing (hand, LLE, RLE) with +2 for w/c follow for safety.  Pt appropriately requesting rest breaks as needed during gait training.    Pt returned to room at end of session and positioned upright in TIS w/c, QRB in place, and daughter present at bedside.  Call bell in reach and needs met.   Therapy Documentation Precautions:  Precautions Precautions: Fall Precaution Comments: left hemi, L inattention, impulsive Restrictions Weight Bearing Restrictions: No   See Function Navigator for Current Functional Status.   Therapy/Group: Individual Therapy  [name] 04/27/2016, 3:36 PM

## 2016-04-27 NOTE — Progress Notes (Signed)
Occupational Therapy Session Note  Patient Details  Name: Abigail Campos MRN: [id number] Date of Birth: 03-12-1971  Today's Date: 04/27/2016 OT Individual Time: 1000-1058 OT Individual Time Calculation (min): 58 min    Short Term Goals: Week 3:  OT Short Term Goal 1 (Week 3): Pt will locate and maintain L UE during self care tasks for safety awareness with mod verbal cues.  OT Short Term Goal 2 (Week 3): Pt will participate in 3 minutes of self care tasks with 2 rest breaks or less in order to increase alertness and endurance for functional tasks.  OT Short Term Goal 3 (Week 3): Pt will perform toileting with max A in order to decrease level of assist with functional task.   Skilled Therapeutic Interventions/Progress Updates:  Upon entering the room, pt alert in bed with c/o discomfort in stomach. Pt requesting toileting this session. Pt performed supine >sit with max A for trunk and B LEs to EOB. Pt performed squat pivot transfer with max A into wheelchair. OT propelled wheelchair into room. Pt standing with use of grab bar and needing assistance for clothing management and hygiene. Pt able to void after a period of time. While seated on commode, pt washed portions of LB for time management. Pt transferring back to wheelchair in the same manner and seated in wheelchair at sink for UB self care. Pt needing mod multimodal cues for attention, sequencing, and initiation of functional tasks safety this session. Quick release belt donned and pt placed at RN session for safety at end of session.   Therapy Documentation Precautions:  Precautions Precautions: Fall Precaution Comments: left hemi, L inattention, impulsive Restrictions Weight Bearing Restrictions: No  See Function Navigator for Current Functional Status.   Therapy/Group: Individual Therapy  Phineas Semen 04/27/2016, 12:57 PM

## 2016-04-28 ENCOUNTER — Inpatient Hospital Stay (HOSPITAL_COMMUNITY): Payer: Managed Care, Other (non HMO) | Admitting: Occupational Therapy

## 2016-04-28 ENCOUNTER — Inpatient Hospital Stay (HOSPITAL_COMMUNITY): Payer: Managed Care, Other (non HMO) | Admitting: Physical Therapy

## 2016-04-28 ENCOUNTER — Inpatient Hospital Stay (HOSPITAL_COMMUNITY): Payer: Managed Care, Other (non HMO) | Admitting: Speech Pathology

## 2016-04-28 DIAGNOSIS — N179 Acute kidney failure, unspecified: Secondary | ICD-10-CM | POA: Insufficient documentation

## 2016-04-28 DIAGNOSIS — R638 Other symptoms and signs concerning food and fluid intake: Secondary | ICD-10-CM | POA: Insufficient documentation

## 2016-04-28 MED ORDER — DRONABINOL 2.5 MG PO CAPS
5.0000 mg | ORAL_CAPSULE | Freq: Two times a day (BID) | ORAL | Status: DC
  Administered 2016-04-28 – 2016-05-08 (×21): 5 mg via ORAL
  Filled 2016-04-28 (×22): qty 2

## 2016-04-28 NOTE — Progress Notes (Signed)
Physical Therapy Weekly Progress Note  Patient Details  Name: Abigail Campos MRN: [id number] Date of Birth: 1971/05/27  Beginning of progress report period: Apr 21, 2016 End of progress report period: Apr 28, 2016  Today's Date: 04/28/2016 PT Individual Time: 1300-1400 PT Individual Time Calculation (min): 60 min   Patient has met 2 of 4 short term goals.  Pt has made progress this week with transfers, standing, and gait, and is demonstrating inconsistent activation of L glutes and L quad during weight bearing activities.  Pt did not meet sitting balance or w/c goals 2/2  focus of therapy session this week being on maximizing time in positions with LE weight bearing.  Pt continues to demonstrate impairments in safety awareness and impulsivity.  PT also initiated family education for bed<>chair transfers with pt's husband this week as the family is wanting to take pt home.    Patient continues to demonstrate the following deficits: safety awareness, L hemiparesis, impaired timing/sequencing, delayed protective reactions, and decreased balance and therefore will continue to benefit from skilled PT intervention to enhance overall performance with activity tolerance, balance, postural control, functional use of  left upper extremity and left lower extremity, attention, awareness and coordination.  Patient progressing toward long term goals..  Continue plan of care.  PT Short Term Goals Week 3:  PT Short Term Goal 1 (Week 3): Pt will demonstrate static sitting balance x10 minutes with supervision PT Short Term Goal 1 - Progress (Week 3): Not met PT Short Term Goal 2 (Week 3): Pt will demonstrate transfers with mod assist in 3/4 observed trials PT Short Term Goal 2 - Progress (Week 3): Met PT Short Term Goal 3 (Week 3): Pt will propel w/c x150' with supervision PT Short Term Goal 3 - Progress (Week 3): Discontinued (comment) (pt transitioned back to tilt in space chair 2/2 safety concerns with  impulsivity and balance) PT Short Term Goal 4 (Week 3): Pt will demonstrate static standing with min assist  PT Short Term Goal 4 - Progress (Week 3): Met Week 4:  PT Short Term Goal 1 (Week 4): =LTGs due to ELOS  Skilled Therapeutic Interventions/Progress Updates:    Pt received resting in bed with no c/o pain and agreeable to therapy session, requesting to go outside.    Supine>sit with supervision, pt able to use RLE to move LLE off EOB and use bedrails to come to sitting.  Bed>w/c transfer with min assist for weight shifting and multimodal cues for hand placement.  Pt taken outside in w/c and engaged in cognitive naming task with pt demonstrating 80% accuracy with min verbal cues.    NMR through gait training 3x30' with rest breaks between each trial.  Pt requires max assist for LLE advancement and max multimodal cues for sequencing of LE advancement, RUE placement, and upright posture as well as w/c follow for safety.  Pt demonstrates improved L quad and L glute activation in L stance phase during all 3 trials of gait today.  Gait speed .25 m/s.  NMR via forced use for LLE as pt performed R toe taps on 3" step with R rail x6 reps with max/total assist for balance and faciltation for knee extension in LLE.   Pt returned to room at end of session and positioned up in recliner with call bell in reach and needs met.    Therapy Documentation Precautions:  Precautions Precautions: Fall Precaution Comments: left hemi, L inattention, impulsive Restrictions Weight Bearing Restrictions: No Locomotion : Gait  Gait: Yes Gait Pattern: Impaired Gait Pattern: Step-to pattern;Decreased step length - right;Decreased stance time - left;Decreased weight shift to left;Right hip hike;Lateral hip instability Gait velocity: .25 m/s with max assist    See Function Navigator for Current Functional Status.  Therapy/Group: Individual Therapy  [name] 04/28/2016, 2:23 PM

## 2016-04-28 NOTE — Progress Notes (Signed)
[place] PHYSICAL MEDICINE & REHABILITATION     PROGRESS NOTE  Subjective/Complaints:  Patient seen lying in bed this morning. She is weak as usual, she does not have her braces on however. She also appears to be more conversant this morning.  ROS: full ROS limited due to language.  Objective: Vital Signs: Blood pressure 118/81, pulse 62, temperature 98.3 F (36.8 C), temperature source Oral, resp. rate 18, weight 66.9 kg (147 lb 7.8 oz), SpO2 99 %. No results found.  Recent Labs  04/27/16 1015  WBC 5.4  HGB 11.7*  HCT 37.0  PLT 207    Recent Labs  04/27/16 1015  NA 135  K 4.2  CL 102  GLUCOSE 125*  BUN 22*  CREATININE 1.25*  CALCIUM 9.9   CBG (last 3)  No results for input(s): GLUCAP in the last 72 hours.  Wt Readings from Last 3 Encounters:  04/24/16 66.9 kg (147 lb 7.8 oz)  04/06/16 73.6 kg (162 lb 4.1 oz)  01/22/16 74.9 kg (165 lb 2 oz)    Physical Exam:  BP 118/81 mmHg  Pulse 62  Temp(Src) 98.3 F (36.8 C) (Oral)  Resp 18  Wt 66.9 kg (147 lb 7.8 oz)  SpO2 99% Constitutional: She appears well-developed.  NAD HENT:  Normocephalic and atraumatic.  Eyes: Conjunctivae and EOM are normal.     Cardiovascular: Normal rate and regular rhythm.    Respiratory: Effort normal and breath sounds normal. No respiratory distress.   GI: Soft. Bowel sounds are normal. She exhibits no distension.  Musculoskeletal: She exhibits no edema or no tenderness.  Neurological: She is alert.  Inconsistently follows commands Expressive And receptive aphasia, Perseverative, Slightly improved  Dysarthria Left facial weakness Motor: Moving right upper and right lower extremity freely.  Not moving left side.  Skin: Skin is warm and dry.  Psychiatric:  Smiling, in good spirits  Assessment/Plan: 1. Functional deficits secondary to right basal ganglia hemorrhage which require 3+ hours per day of interdisciplinary therapy in a comprehensive inpatient rehab setting. Physiatrist  is providing close team supervision and 24 hour management of active medical problems listed below. Physiatrist and rehab team continue to assess barriers to discharge/monitor patient progress toward functional and medical goals.  Function:  Bathing Bathing position Bathing activity did not occur: Refused Position: Shower  Bathing parts Body parts bathed by patient: Chest, Abdomen, Left arm, Right upper leg, Left upper leg (UB in wheelchair at sink) Body parts bathed by helper: Right arm, Front perineal area, Buttocks, Right lower leg, Left lower leg, Back  Bathing assist Assist Level:  (mod A)      Upper Body Dressing/Undressing Upper body dressing Upper body dressing/undressing activity did not occur: Refused What is the patient wearing?: Pull over shirt/dress, Bra Bra - Perfomed by patient: Thread/unthread right bra strap Bra - Perfomed by helper: Thread/unthread left bra strap, Hook/unhook bra (pull down sports bra) Pull over shirt/dress - Perfomed by patient: Thread/unthread right sleeve, Put head through opening Pull over shirt/dress - Perfomed by helper: Thread/unthread left sleeve, Pull shirt over trunk        Upper body assist Assist Level:  (max a)      Lower Body Dressing/Undressing Lower body dressing Lower body dressing/undressing activity did not occur: Refused What is the patient wearing?: Pants     Pants- Performed by patient: Thread/unthread right pants leg Pants- Performed by helper: Thread/unthread left pants leg, Pull pants up/down, Thread/unthread right pants leg   Non-skid slipper socks- Performed by  helper: Don/doff right sock, Don/doff left sock               TED Hose - Performed by helper: Don/doff right TED hose, Don/doff left TED hose  Lower body assist Assist for lower body dressing:  (total A)      Toileting Toileting Toileting activity did not occur: No continent bowel/bladder event Toileting steps completed by patient: Performs perineal  hygiene Toileting steps completed by helper: Adjust clothing prior to toileting, Performs perineal hygiene, Adjust clothing after toileting Toileting Assistive Devices:  (bedside commode)  Toileting assist Assist level: Two helpers   Transfers Chair/bed transfer   Chair/bed transfer method: Squat pivot Chair/bed transfer assist level: Moderate assist (Pt 50 - 74%/lift or lower) (min assist to pt's R, mod assist to pt's L) Chair/bed transfer assistive device: Armrests     Locomotion Ambulation Ambulation activity did not occur: Safety/medical concerns   Max distance: [id number] Assist level: 2 helpers (max assist for gait, +2 for w/c follow)   Wheelchair   Type: Manual (TIS) Max wheelchair distance: 150 Assist Level: Dependent (Pt equals 0%)  Cognition Comprehension Comprehension assist level: Understands basic 75 - 89% of the time/ requires cueing 10 - 24% of the time  Expression Expression assist level: Expresses basic 25 - 49% of the time/requires cueing 50 - 75% of the time. Uses single words/gestures.  Social Interaction Social Interaction assist level: Interacts appropriately 75 - 89% of the time - Needs redirection for appropriate language or to initiate interaction.  Problem Solving Problem solving assist level: Solves basic 25 - 49% of the time - needs direction more than half the time to initiate, plan or complete simple activities  Memory Memory assist level: Recognizes or recalls 25 - 49% of the time/requires cueing 50 - 75% of the time     Medical Problem List and Plan: 1.Left hemiplegia, aphasia, dysphagia secondary to right basal ganglia hemorrhage secondary to hypertensive crisis on 04/01/16.   Continue CIR therapies  PRAFO for left lower extremity and splints for left upper extremity ordered  Fluoxetine 10 started on 5/1, Increased to 20 on 5/8 2. DVT Prophylaxis/Anticoagulation: Subcutaneous heparin for DVT prophylaxis initiated 04/07/2016 3. Pain  Management/migraine headaches: Fioricet for headaches and Robaxin. 4. Dysphagia. #2 thin liquid diet. Speech therapy follow-up. Will advance diet as tolerated  Remeron changed to Marinol on 5/12,Increased on 5/16.   Eating 50 % of meals . 5. Neuropsych: This patient is not capable of making decisions on her own behalf. 6. Skin/Wound Care: Routine skin checks 7. Fluids/Electrolytes/Nutrition: Routine I&O   BMP within acceptable range on 5/15 8. Hypertension now with hypotension,   Plendil 10 mg daily DC'd on 5/15  Hydralazine 10 mg 3 times a day, DC'd on 5/10   Lisinopril 20 mg twice a day, increased to 40 mg on 4/28, changed to 40 mg daily on 5/11, decreased to 20 mg daily on 5/12, decreased to 5 mg on [id number]  Bysystolic 20 mg every 12 hours, decreased to 5 mg on 5/9   Will cont to monitor and wean medications 9. Hyperlipidemia. Lipitor 10. Spasticity: Controlled  Continue baclofen 5 mg on 4/27, DC'd on 5/10 11. Thrush:   Treated with Diflucan, DC'd on 5/3 12. AKI  Creatinine 1.25 on 5/15  Will encourage fluids  LOS (Days) 20 A FACE TO FACE EVALUATION WAS PERFORMED  Ankit [name] 04/28/2016 9:07 AM

## 2016-04-28 NOTE — Progress Notes (Signed)
Occupational Therapy Weekly Progress Note  Patient Details  Name: Abigail Campos MRN: [id number] Date of Birth: May 27, 1971  Beginning of progress report period: Apr 22, 2016 End of progress report period: Apr 28, 2016     Patient has met 2 of 3 short term goals. Pt making progress towards OT short term and long term goals this week. Pt is more alert, motivated, and no longer with c/o head pain during sessions. Pt agreeable to participate in OT intervention. Pt has been able to shower for the first time since initial evaluation while seated on TTB for safety. Pt needs mod A doe squat pivot transfer from bed >wheelchair. She continues to need cues for cognition, safety and impulsivity. Pt continues to benefit from OT intervention.   Patient continues to demonstrate the following deficits: decreased I in self care, balance, functional transfers/mobility, decreased functional use of L UE and L LE, decreased strength and AROM in L UE and L LE, decreased cognition, decreased safety awareness, decreased sensation and therefore will continue to benefit from skilled OT intervention to enhance overall performance with BADL.  Patient progressing toward long term goals..  Continue plan of care.  OT Short Term Goals Week 3:  OT Short Term Goal 1 (Week 3): Pt will locate and maintain L UE during self care tasks for safety awareness with mod verbal cues.  OT Short Term Goal 1 - Progress (Week 3): Not met OT Short Term Goal 2 (Week 3): Pt will participate in 3 minutes of self care tasks with 2 rest breaks or less in order to increase alertness and endurance for functional tasks.  OT Short Term Goal 2 - Progress (Week 3): Met OT Short Term Goal 3 (Week 3): Pt will perform toileting with max A in order to decrease level of assist with functional task.  OT Short Term Goal 3 - Progress (Week 3): Met Week 4:  OT Short Term Goal 1 (Week 4): STGs=LTGs secondary to upcoming discharge    Therapy  Documentation Precautions:  Precautions Precautions: Fall Precaution Comments: left hemi, L inattention, impulsive Restrictions Weight Bearing Restrictions: No Vital Signs: Therapy Vitals Temp: 99 F (37.2 C) (RN notified) Temp Source: Oral Pulse Rate: 81 Resp: 18 BP: 114/77 mmHg Patient Position (if appropriate): Sitting Oxygen Therapy SpO2: 100 % O2 Device: Not Delivered   Phineas Semen 04/28/2016, 4:35 PM

## 2016-04-28 NOTE — Progress Notes (Signed)
Occupational Therapy Session Note  Patient Details  Name: Abigail Campos MRN: [id number] Date of Birth: Sep 18, 1971  Today's Date: 04/28/2016 OT Individual Time: 1001-1100 and 1445 and 1515 OT Individual Time Calculation (min): 59 min and 30 min    Short Term Goals: Week 3:  OT Short Term Goal 1 (Week 3): Pt will locate and maintain L UE during self care tasks for safety awareness with mod verbal cues.  OT Short Term Goal 2 (Week 3): Pt will participate in 3 minutes of self care tasks with 2 rest breaks or less in order to increase alertness and endurance for functional tasks.  OT Short Term Goal 3 (Week 3): Pt will perform toileting with max A in order to decrease level of assist with functional task.   Skilled Therapeutic Interventions/Progress Updates:   Session 1: Upon entering the room, pt sitting on EOB with NT with urgent need for toileting. Pt transitioned easily into therapy session with mod A for squat pivot transfer into wheelchair. OT propelling wheelchair into bathroom and pt needing max A for stand pivot transfer onto commode. Pt needing total A for balance when attempting to assist with clothing management and hygiene. Pt declined bathing and dressing this session. Pt able to verbalize all clothing items are dirty. Pt able to verbalize her mother's number for therapist to dial. Pt engaged in functional communication with mother on phone to ask her to bring clean clothes to hospital. OT propelled wheelchair outside for fresh air. OT discussing pt's slow progress towards goals with discharge arriving soon. Pt reports, "I need more therapy."  However, pt declined going to another facility for further follow up to address functional deficits. Pt able to verbally navigate from outside onto elevator and back to rehab with mod multimodal cues such as questioning cues and signage to assist pt with locating unit. Pt remained in wheelchair with SLP entering the room for next  session.  Session 2: Upon entering the room, pt seated on wheelchair with mother present in room.  Pt with no c/o pain this session and agreeable to OT intervention. OT propelled pt via wheelchair into dayroom. L hand brace removed and skin checked with no breakdown noted. Pt standing from wheelchair with mod A for lifting. Pt placed into weight bearing with L UE onto table top and OT blocking L knee while standing. Pt dealing out playing cards and sorting with R UE. Pt having to place cards to the L and R on table by reaching in both directions to do so. Pt standing for a total of 13 minutes this session with mod - max A for balance as pt began to fatigue. Pt returning to wheelchair with quick release belt donned and returned to room with mother remaining. Call bell and all needed items within reach upon exiting the room.   Therapy Documentation Precautions:  Precautions Precautions: Fall Precaution Comments: left hemi, L inattention, impulsive Restrictions Weight Bearing Restrictions: No  See Function Navigator for Current Functional Status.   Therapy/Group: Individual Therapy  Phineas Semen 04/28/2016, 12:59 PM

## 2016-04-28 NOTE — Progress Notes (Signed)
Speech Language Pathology Daily Session Note  Patient Details  Name: Abigail Campos MRN: [id number] Date of Birth: 01/07/1971  Today's Date: 04/28/2016 SLP Individual Time: 1100-1200 SLP Individual Time Calculation (min): 60 min  Short Term Goals: Week 3: SLP Short Term Goal 1 (Week 3): Patient will consume current diet with minimal overt s/s of aspiration with Min A verbal cues for use of swallowing strategies.  SLP Short Term Goal 2 (Week 3): Patient will demonstrate sustained attention to functional tasks for 15 minutes with Min A verbal cues for redirection.  SLP Short Term Goal 3 (Week 3): Patient will attend to left field of enviornment during functional tasks with Min A verbal ces.  SLP Short Term Goal 4 (Week 3): Patient will answer basic yes/no questions with 75% accuracy in regards to wants/needs with Min A multimodal cues.  SLP Short Term Goal 5 (Week 3): Patient will name common objects with 75% accuracy with Min A multimodal cues.  SLP Short Term Goal 6 (Week 3): Patient will self-monitor and correct phonemic errors at the word level in 50% of opportunities with Max A verbal cues.   Skilled Therapeutic Interventions: Skilled treatment session focused on cognitive-linguistic and dysphagia goals. SLP facilitated session by providing supervision verbal cues for patient to attend to left field of environment and identify colors from a field of 5 with Mod I. Patient was able to name common food items from pictures with 100% accuracy and Mod A question and verbal cues to self-monitor and correct phonemic paraphasias. Patient also identified the category each item belonged to from a field of 4 with supervision question cues. SLP also facilitated session by providing skilled observation with lunch meal of Dys. 2 textures with thin liquids. Patient consumed meal without overt s/s of aspiration and was Mod I for use of small bites but continues to require Min A verbal cues to self-monitor and  correct left buccal pocketing and anterior spillage. Recommend trials of Dys. 3 textures tomorrow. Patient transferred back to bed at end of session. Patient left supine with all needs within reach and alarm on. Continue with current plan of care.    Function:  Eating Eating   Modified Consistency Diet: Yes Eating Assist Level: Set up assist for;Supervision or verbal cues;Helper checks for pocketed food   Eating Set Up Assist For: Opening containers       Cognition Comprehension Comprehension assist level: Understands basic 75 - 89% of the time/ requires cueing 10 - 24% of the time  Expression   Expression assist level: Expresses basic 25 - 49% of the time/requires cueing 50 - 75% of the time. Uses single words/gestures.  Social Interaction Social Interaction assist level: Interacts appropriately 75 - 89% of the time - Needs redirection for appropriate language or to initiate interaction.  Problem Solving Problem solving assist level: Solves basic 25 - 49% of the time - needs direction more than half the time to initiate, plan or complete simple activities  Memory Memory assist level: Recognizes or recalls 25 - 49% of the time/requires cueing 50 - 75% of the time    Pain No/Denies Pain   Therapy/Group: Individual Therapy  Quinterious Walraven 04/28/2016, 2:15 PM

## 2016-04-29 ENCOUNTER — Inpatient Hospital Stay (HOSPITAL_COMMUNITY): Payer: Managed Care, Other (non HMO) | Admitting: Speech Pathology

## 2016-04-29 ENCOUNTER — Inpatient Hospital Stay (HOSPITAL_COMMUNITY): Payer: Managed Care, Other (non HMO) | Admitting: Physical Therapy

## 2016-04-29 ENCOUNTER — Inpatient Hospital Stay (HOSPITAL_COMMUNITY): Payer: Managed Care, Other (non HMO) | Admitting: Occupational Therapy

## 2016-04-29 MED ORDER — LISINOPRIL 2.5 MG PO TABS
2.5000 mg | ORAL_TABLET | Freq: Every day | ORAL | Status: DC
  Administered 2016-04-29 – 2016-05-08 (×9): 2.5 mg via ORAL
  Filled 2016-04-29 (×10): qty 1

## 2016-04-29 MED ORDER — LISINOPRIL 2.5 MG PO TABS: 2.5000 mg | ORAL_TABLET | Freq: Every day | ORAL | Status: DC

## 2016-04-29 NOTE — Progress Notes (Signed)
Occupational Therapy Session Note  Patient Details  Name: Abigail Campos MRN: [id number] Date of Birth: 07/18/1971  Today's Date: 04/29/2016 OT Individual Time: 1000-1100 OT Individual Time Calculation (min): 60 min    Short Term Goals: Week 1:  OT Short Term Goal 1 (Week 1): Pt will participate in 5 minutes of self care tasks with 2 breaks or less in order to increase alertness and endurance for functional tasks. OT Short Term Goal 1 - Progress (Week 1): Progressing toward goal OT Short Term Goal 2 (Week 1): Pt will visually track to the L to locate 3 items during OT intervention with min verbal cues. OT Short Term Goal 2 - Progress (Week 1): Met OT Short Term Goal 3 (Week 1): Pt will perform transfer with one person to toilet in order to decrease level of assist needed with functional transfers.  OT Short Term Goal 3 - Progress (Week 1): Met  Skilled Therapeutic Interventions/Progress Updates:  Upon entering the room, pt supine in bed and stating, "Hi, " when therapist entered the room. Pt able to verbalize in detail what we discussed yesterday regarding plans for this session to take a shower. Pt performed supine >sit with min A and use of bedrail. Pt performed squat pivot transfer bed >wheelchair with mod A for lifting. OT propelled wheelchair into bathroom with max A for stand pivot transfer with use of grab bar from wheelchair <> TTB. Pt's seated balance on TTB with supervision and occasional  steady assist for safety. Pt needing hand over hand assist for L UE to be utilized in functional bathing task. Lateral leans in order to assist with washing buttocks. Pt returned to wheelchair in same manner. Dressing from wheelchair at sink with max A for standing balance during LB clothing management. Pt needing mod multimodal cues for sequencing for hemiplegic dressing techniques. Pt dressed with wheelchair reclined and quick release belt donned. Pt transitioning easily to SLP session upon  exiting the room.   Therapy Documentation Precautions:  Precautions Precautions: Fall Precaution Comments: left hemi, L inattention, impulsive Restrictions Weight Bearing Restrictions: No  See Function Navigator for Current Functional Status.   Therapy/Group: Individual Therapy  P[place],  L 04/29/2016, 11:53 AM  

## 2016-04-29 NOTE — Progress Notes (Signed)
[facility] PHYSICAL MEDICINE & REHABILITATION     PROGRESS NOTE  Subjective/Complaints:  Pt laying in bed this AM.  She has her splints on.  Her aphasia continues to approve as today she able to tell me it is May 2007 when asked the date.   ROS: full ROS limited due to language.  Objective: Vital Signs: Blood pressure 103/63, pulse 65, temperature 98.9 F (37.2 C), temperature source Oral, resp. rate 20, weight 66.9 kg (147 lb 7.8 oz), SpO2 99 %. No results found.  Recent Labs  04/27/16 1015  WBC 5.4  HGB 11.7*  HCT 37.0  PLT 207    Recent Labs  04/27/16 1015  NA 135  K 4.2  CL 102  GLUCOSE 125*  BUN 22*  CREATININE 1.25*  CALCIUM 9.9   CBG (last 3)  No results for input(s): GLUCAP in the last 72 hours.  Wt Readings from Last 3 Encounters:  04/24/16 66.9 kg (147 lb 7.8 oz)  04/06/16 73.6 kg (162 lb 4.1 oz)  01/22/16 74.9 kg (165 lb 2 oz)    Physical Exam:  BP 103/63 mmHg  Pulse 65  Temp(Src) 98.9 F (37.2 C) (Oral)  Resp 20  Wt 66.9 kg (147 lb 7.8 oz)  SpO2 99% Constitutional: She appears well-developed.  NAD HENT:  Normocephalic and atraumatic.  Eyes: Conjunctivae and EOM are normal.     Cardiovascular: Normal rate and regular rhythm.    Respiratory: Effort normal and breath sounds normal. No respiratory distress.   GI: Soft. Bowel sounds are normal. She exhibits no distension.  Musculoskeletal: She exhibits no edema or no tenderness.  Neurological: She is alert.  Inconsistently follows commands Expressive > receptive aphasia, Perseverative, improving Dysarthria Left facial weakness Motor: Moving right upper and right lower extremity freely.  Not moving left side.  No spasticity noted Skin: Skin is warm and dry.  Psychiatric:  Smiling, in good spirits  Assessment/Plan: 1. Functional deficits secondary to right basal ganglia hemorrhage which require 3+ hours per day of interdisciplinary therapy in a comprehensive inpatient rehab  setting. Physiatrist is providing close team supervision and 24 hour management of active medical problems listed below. Physiatrist and rehab team continue to assess barriers to discharge/monitor patient progress toward functional and medical goals.  Function:  Bathing Bathing position Bathing activity did not occur: Refused Position: Shower  Bathing parts Body parts bathed by patient: Chest, Abdomen, Left arm, Right upper leg, Left upper leg (UB in wheelchair at sink) Body parts bathed by helper: Right arm, Front perineal area, Buttocks, Right lower leg, Left lower leg, Back  Bathing assist Assist Level:  (mod A)      Upper Body Dressing/Undressing Upper body dressing Upper body dressing/undressing activity did not occur: Refused What is the patient wearing?: Pull over shirt/dress, Bra Bra - Perfomed by patient: Thread/unthread right bra strap Bra - Perfomed by helper: Thread/unthread left bra strap, Hook/unhook bra (pull down sports bra) Pull over shirt/dress - Perfomed by patient: Thread/unthread right sleeve, Put head through opening Pull over shirt/dress - Perfomed by helper: Thread/unthread left sleeve, Pull shirt over trunk        Upper body assist Assist Level:  (max a)      Lower Body Dressing/Undressing Lower body dressing Lower body dressing/undressing activity did not occur: Refused What is the patient wearing?: Pants     Pants- Performed by patient: Thread/unthread right pants leg Pants- Performed by helper: Thread/unthread left pants leg, Pull pants up/down, Thread/unthread right pants leg  Non-skid slipper socks- Performed by helper: Don/doff right sock, Don/doff left sock               TED Hose - Performed by helper: Don/doff right TED hose, Don/doff left TED hose  Lower body assist Assist for lower body dressing:  (total A)      Toileting Toileting Toileting activity did not occur: No continent bowel/bladder event Toileting steps completed by  patient: Performs perineal hygiene Toileting steps completed by helper: Adjust clothing prior to toileting, Performs perineal hygiene, Adjust clothing after toileting Toileting Assistive Devices:  (bedside commode)  Toileting assist Assist level: Two helpers   Transfers Chair/bed transfer   Chair/bed transfer method: Squat pivot Chair/bed transfer assist level: Touching or steadying assistance (Pt > 75%) Chair/bed transfer assistive device: Armrests     Locomotion Ambulation Ambulation activity did not occur: Safety/medical concerns   Max distance: 30x3 Assist level: 2 helpers (max assist for gait, +2 for w/c follow)   Wheelchair   Type: Manual (TIS) Max wheelchair distance: 150 Assist Level: Dependent (Pt equals 0%)  Cognition Comprehension Comprehension assist level: Understands basic 75 - 89% of the time/ requires cueing 10 - 24% of the time  Expression Expression assist level: Expresses basic 25 - 49% of the time/requires cueing 50 - 75% of the time. Uses single words/gestures.  Social Interaction Social Interaction assist level: Interacts appropriately 75 - 89% of the time - Needs redirection for appropriate language or to initiate interaction.  Problem Solving Problem solving assist level: Solves basic 25 - 49% of the time - needs direction more than half the time to initiate, plan or complete simple activities  Memory Memory assist level: Recognizes or recalls 25 - 49% of the time/requires cueing 50 - 75% of the time     Medical Problem List and Plan: 1.Left hemiplegia, aphasia, dysphagia secondary to right basal ganglia hemorrhage secondary to hypertensive crisis on 04/01/16.   Continue CIR therapies  PRAFO for left lower extremity and splints for left upper extremity ordered  Fluoxetine 10 started on 5/1, Increased to 20 on 5/8  Spoke to therapies regarding use of NMES 2. DVT Prophylaxis/Anticoagulation: Subcutaneous heparin for DVT prophylaxis initiated 04/07/2016 3.  Pain Management/migraine headaches: Fioricet for headaches and Robaxin. 4. Dysphagia. #2 thin liquid diet. Speech therapy follow-up. Will advance diet as tolerated  Remeron changed to Marinol on 5/12, increased on 5/16.   Eating 25-50 % of meals. 5. Neuropsych: This patient is not capable of making decisions on her own behalf. 6. Skin/Wound Care: Routine skin checks 7. Fluids/Electrolytes/Nutrition: Routine I&O   BMP within acceptable range on 5/15 8. Hypertension now with hypotension,   Plendil 10 mg daily DC'd on 5/15  Hydralazine 10 mg 3 times a day, DC'd on 5/10   Lisinopril 20 mg twice a day, increased to 40 mg on 4/28, changed to 40 mg daily on 5/11, decreased to 20 mg daily on 5/12, decreased to 5 mg on 5/15, decreased to 2.5 on [id number]  Bysystolic 20 mg every 12 hours, decreased to 5 mg on 5/9  Will cont to monitor and wean medications 9. Hyperlipidemia. Lipitor 10. Spasticity: Controlled  Continue baclofen 5 mg on 4/27, DC'd on 5/10 11. Thrush:   Treated with Diflucan, DC'd on 5/3 12. AKI  Creatinine 1.25 on 5/15  Will encourage fluids  LOS (Days) 21 A FACE TO FACE EVALUATION WAS PERFORMED  Reynol Arnone [name] 04/29/2016 8:39 AM

## 2016-04-29 NOTE — Progress Notes (Signed)
Speech Language Pathology Daily Session Note  Patient Details  Name: Abigail Campos MRN: [id number] Date of Birth: Nov 30, 1971  Today's Date: 04/29/2016  Session 1: SLP Individual Time: 1100-1200 SLP Individual Time Calculation (min): 60 min   Session 2: SLP Individual Time: [id number] SLP Individual Time Calculation (min): 30 min  Short Term Goals: Week 3: SLP Short Term Goal 1 (Week 3): Patient will consume current diet with minimal overt s/s of aspiration with Min A verbal cues for use of swallowing strategies.  SLP Short Term Goal 2 (Week 3): Patient will demonstrate sustained attention to functional tasks for 15 minutes with Min A verbal cues for redirection.  SLP Short Term Goal 3 (Week 3): Patient will attend to left field of enviornment during functional tasks with Min A verbal ces.  SLP Short Term Goal 4 (Week 3): Patient will answer basic yes/no questions with 75% accuracy in regards to wants/needs with Min A multimodal cues.  SLP Short Term Goal 5 (Week 3): Patient will name common objects with 75% accuracy with Min A multimodal cues.  SLP Short Term Goal 6 (Week 3): Patient will self-monitor and correct phonemic errors at the word level in 50% of opportunities with Max A verbal cues.   Skilled Therapeutic Interventions:   Session 1: Skilled treatment session focused on dysphagia and functional communication goals. SLP facilitated session by providing skilled observation with trials of Dys. 3 textures. Patient demonstrated efficient mastication of trials and required supervision verbal cues to self-monitor and correct left pocketing without overt s/s of aspiration. Recommend trial tray tomorrow prior to upgrade. Patient also consumed minimal amounts of her lunch meal of Dys. 2 textures with thin liquids without overt s/s of aspiration and required supervision verbal cues to self-monitor and correct left labial spillage. SLP also facilitated session by providing Mod A verbal cues  for patient to decode at the phrase level and complete sentence completion task with Min A semantic cues for word-finding and Mod A verbal cues to self-monitor and correct phonemic errors. Patient left reclined in the tile-in-space wheelchair with quick release belt in place and all needs within reach. Continue with current plan of care.   Session 2: Skilled treatment session focused on functional communication. SLP facilitated session by providing Mod A verbal and visual cues for patient to self-monitor and correct errors during decoding task at the sentence level and Mod A semantic cues for generative naming in order to complete task and self-monitoring and correcting of phonemic paraphasias. Patient left upright in wheelchair with family present. Continue with current plan of care.    Function:  Eating Eating   Modified Consistency Diet: Yes Eating Assist Level: Set up assist for;Supervision or verbal cues;Helper checks for pocketed food   Eating Set Up Assist For: Opening containers       Cognition Comprehension Comprehension assist level: Understands basic 75 - 89% of the time/ requires cueing 10 - 24% of the time  Expression   Expression assist level: Expresses basic 50 - 74% of the time/requires cueing 25 - 49% of the time. Needs to repeat parts of sentences.  Social Interaction Social Interaction assist level: Interacts appropriately 75 - 89% of the time - Needs redirection for appropriate language or to initiate interaction.  Problem Solving Problem solving assist level: Solves basic 50 - 74% of the time/requires cueing 25 - 49% of the time  Memory Memory assist level: Recognizes or recalls 50 - 74% of the time/requires cueing 25 - 49% of the  time    Pain No/Denies Pain   Therapy/Group: Individual Therapy  Blase Beckner 04/29/2016, 4:59 PM

## 2016-04-29 NOTE — Patient Care Conference (Signed)
Inpatient RehabilitationTeam Conference and Plan of Care Update Date: 04/29/2016   Time:  2:30 PM   Patient Name: Abigail Campos      Medical Record Number: [id number]  Date of Birth: 04/02/1971 Sex: Female         Room/Bed: 4W05C/4W05C-01 Payor Info: Payor: Holland Falling / Plan: Alwyn Pea / Product Type: *No Product type* /    Admitting Diagnosis: R  BG hemorrhage  Admit Date/Time:  04/08/2016  6:25 PM Admission Comments: No comment available   Primary Diagnosis:  <principal problem not specified> Principal Problem: <principal problem not specified>  Patient Active Problem List   Diagnosis Date Noted  . Ear itching   . Poor fluid intake   . AKI (acute kidney injury) ([place])   . Bradycardia, drug induced   . Hypotension due to drugs   . Poor nutrition   . Muscle spasticity   . Hypertensive emergency 04/08/2016  . Obesity (BMI 30.0-34.9) 04/08/2016  . Hyperlipidemia 04/08/2016  . Basal ganglia hemorrhage ([place]) 04/08/2016  . Aphasia as late effect of cerebrovascular accident   . Dysphagia as late effect of cerebrovascular disease   . Migraine with aura and without status migrainosus, not intractable   . HLD (hyperlipidemia)   . Benign essential HTN   . Gait disturbance, post-stroke   . Hemiplegia, post-stroke ([place])   . Hypokalemia   . Dysphagia, post-stroke   . Aphasia, post-stroke   . Bradycardia   . Headache, migraine   . Dysarthria, post-stroke   . Encephalopathy acute 04/02/2016  . ICH (intracerebral hemorrhage) ([facility]) - R basal ganglia due to hypertensive emergency 04/01/2016  . Upper airway cough syndrome 12/06/2015  . PCP NOTES >>> 09/25/2015  . Acute bronchitis 11/06/2014  . Need for hepatitis B vaccination 10/19/2014  . Insomnia 12/28/2012  . Annual physical exam 11/23/2011  . Hemorrhoids 10/13/2011  . Morbid obesity ([place]) 02/26/2011  . CHEST PAIN UNSPECIFIED 03/28/2010  . Essential hypertension 10/19/2007  . Headache(784.0) 05/12/2007    Expected Discharge  Date: Expected Discharge Date: 05/06/16  Team Members Present: Physician leading conference: Dr. Alger Simons Social Worker Present: Lennart Pall, LCSW Nurse Present: Dorien Chihuahua, RN PT Present: Dwyane Dee, PT OT Present: Benay Pillow, OT SLP Present: Weston Anna, SLP PPS Coordinator present : Daiva Nakayama, RN, CRRN     Current Status/Progress Goal Weekly Team Focus  Medical   Left hemiplegia, aphasia, dysphagia secondary to right basal ganglia hemorrhage secondary to hypertensive crisis on 04/01/16  Improve mobility, pt and family edu, improve electrolytes  see above   Bowel/Bladder   Continent bowel, continent bladder with timed toileting; occasional incontinence HS.  Continent bowel, bladder with timed toileting  Timed toileting q 3 hrs when awake, at HS, x1 during night.   Swallow/Nutrition/ Hydration   Dys. 2 textures with thin liquids, Supervision-Min A for use of swallowing strategies  Min A with least restrictive diet  trial tray of Dys. 3 textures, use of strategies    ADL's   min - mod A for sit <>stand, Mod A from bed >wheelchair, max A for toilet transfers, max - total A for shower transfer, max A UB self care, total A LB self care, 0/5 flaccid L UE. No trace movement  set up for grooming and eating, min A for UB self care, mod A for all other goals  L UE NMR, pt/family edu, balance, self care retraining, d/c planning, functional transfers/mobility   Mobility   supervision>mod assist for bed mobility using bed rail,  min assist for bed>w/c transfer to R, max to total assist for transfers to L, gait with rail in hallway +2 assist, static sitting balance superivision>min assist, continues to demonstrate decreased safety awareness and impulsivitity  mod assist overall  continue out of bed schedule, sitting balance, standing posture, postural control, transfers, weight bearing through LLE, LLE activation, w/c mobility   Communication   Min-Mod A  Min A   verbal  expression with focus on self-monitoring and correcting phonemic errors   Safety/Cognition/ Behavioral Observations  Mod A  Min A  selective attention, intellectual awareness, problem solving   Pain   Occasional c/o headache, Fioricet effective.  Managed at goal 2/10  Medicate as needed; observe for non-verbal s/s pain.   Skin   No breakdown, no redness, WNL.  No breakdown, no injury this admission.  Monitor    Rehab Goals Patient on target to meet rehab goals: Yes *See Care Plan and progress notes for long and short-term goals.  Barriers to Discharge: poor appetite, flaccid L side, AKI, safety awareness, hypotension    Possible Resolutions to Barriers:  NMES, pt and family edu, optimize BP meds    Discharge Planning/Teaching Needs:  Plan to d/c home with husband and other family members providing 24/7 assistance.  Teaching to be scheduled.   Team Discussion:  MUCH better participation this week.  Decreased impulsivity and actually listening to txs.  Still incont b/b likely due to not thinking to press call button.  Txs concerned about amount of care pt will need at home and suggest two days of training and then 24-48 continuous stay on unit by family.  SW to follow up with them.  SW also to review SNF option and consider extending LOS slightly if insist on d/c home.   Revisions to Treatment Plan:  None   Continued Need for Acute Rehabilitation Level of Care: The patient requires daily medical management by a physician with specialized training in physical medicine and rehabilitation for the following conditions: Daily direction of a multidisciplinary physical rehabilitation program to ensure safe treatment while eliciting the highest outcome that is of practical value to the patient.: Yes Daily medical management of patient stability for increased activity during participation in an intensive rehabilitation regime.: Yes Daily analysis of laboratory values and/or radiology reports with  any subsequent need for medication adjustment of medical intervention for : Neurological problems;Blood pressure problems  Adryana Mogensen 04/30/2016, 5:08 PM

## 2016-04-29 NOTE — Progress Notes (Signed)
Physical Therapy Session Note  Patient Details  Name: Abigail Campos MRN: [id number] Date of Birth: [birth year]  Today's Date: 04/29/2016 PT Individual Time: 1300-1400 PT Individual Time Calculation (min): 60 min   Short Term Goals: Week 4:  PT Short Term Goal 1 (Week 4): =LTGs due to ELOS  Skilled Therapeutic Interventions/Progress Updates:    Pt received resting in bed with no c/o pain and agreeable to therapy session.  Session focus on NMR, weight bearing through LEs, gait training with rail and HW, sitting balance, postural control, transfers, and family education.  Pt performs bed mobility with supervision using bed rail and occasional cues for safe/slower movement.  Pt performs stand/pivot transfers and squat/pivot transfers to R with min assist for weight shifting.  PT provided family education to pt's husband, Abigail Campos, for stand pivot transfers to R and L and Abigail Campos returned demonstration with verbal cues for set up of transfers and safe body mechanics.  Pt and Abigail Campos agree that transfers feel comfortable.    NMR via forced use through gait training with mod assist overall +2 for w/c follow.  Pt amb 30' x2 trials with R rail in hallway and x2 trials with hemiwalker on R.  Max fade to min verbal cues for use of hemiwalker. Pt continues to require total assist for LLE swing through, but demonstrates quad/glute activation on L in 90% of stance phases during gait training.    Dynamic sitting balance and postural control re-education sitting edge of mat while playing Wii Just Dance with supervision overall, occasional min assist for stabilizing through LLE.  Pt able to tolerate unsupported sitting x12 minutes without fatigue.    Pt repositioned in TIS w/c at end of session and husband propelled to speech therapy session.   Therapy Documentation Precautions:  Precautions Precautions: Fall Precaution Comments: left hemi, L inattention, impulsive Restrictions Weight Bearing Restrictions:  No   See Function Navigator for Current Functional Status.   Therapy/Group: Individual Therapy  [name] 04/29/2016, 3:25 PM

## 2016-04-30 ENCOUNTER — Inpatient Hospital Stay (HOSPITAL_COMMUNITY): Payer: Managed Care, Other (non HMO) | Admitting: Speech Pathology

## 2016-04-30 ENCOUNTER — Inpatient Hospital Stay (HOSPITAL_COMMUNITY): Payer: Managed Care, Other (non HMO) | Admitting: Occupational Therapy

## 2016-04-30 ENCOUNTER — Inpatient Hospital Stay (HOSPITAL_COMMUNITY): Payer: Managed Care, Other (non HMO) | Admitting: Physical Therapy

## 2016-04-30 DIAGNOSIS — L299 Pruritus, unspecified: Secondary | ICD-10-CM | POA: Insufficient documentation

## 2016-04-30 MED ORDER — CARBAMIDE PEROXIDE 6.5 % OT SOLN
5.0000 [drp] | Freq: Two times a day (BID) | OTIC | Status: AC
  Administered 2016-04-30 – 2016-05-01 (×3): 5 [drp] via OTIC
  Filled 2016-04-30: qty 15

## 2016-04-30 MED ORDER — POLYVINYL ALCOHOL 1.4 % OP SOLN
1.0000 [drp] | OPHTHALMIC | Status: DC | PRN
  Filled 2016-04-30: qty 15

## 2016-04-30 NOTE — Progress Notes (Signed)
Speech Language Pathology Daily Session Note  Patient Details  Name: Abigail Campos MRN: [id number] Date of Birth: 01-21-1971  Today's Date: 04/30/2016 SLP Individual Time: 1100-1200 SLP Individual Time Calculation (min): 60 min  Short Term Goals: Week 3: SLP Short Term Goal 1 (Week 3): Patient will consume current diet with minimal overt s/s of aspiration with Min A verbal cues for use of swallowing strategies.  SLP Short Term Goal 2 (Week 3): Patient will demonstrate sustained attention to functional tasks for 15 minutes with Min A verbal cues for redirection.  SLP Short Term Goal 3 (Week 3): Patient will attend to left field of enviornment during functional tasks with Min A verbal ces.  SLP Short Term Goal 4 (Week 3): Patient will answer basic yes/no questions with 75% accuracy in regards to wants/needs with Min A multimodal cues.  SLP Short Term Goal 5 (Week 3): Patient will name common objects with 75% accuracy with Min A multimodal cues.  SLP Short Term Goal 6 (Week 3): Patient will self-monitor and correct phonemic errors at the word level in 50% of opportunities with Max A verbal cues.   Skilled Therapeutic Interventions: Skilled treatment session focused on dysphagia and functional communication goals. SLP facilitated session by providing skilled observation with lunch meal of Dys. 3 textures with thin liquids. Patient demonstrated efficient mastication and utilized small bites and self-monitored and corrected her minimal left buccal pocketing with supervision verbal cues with only 1 cough noted with large, sequential sips of thin liquids via straw. Recommend patient upgrade to Dys. 3 textures. SLP also facilitated session by providing Total A multimodal cues for patient to communicate at the phrase level during a verbal description task. Patient demonstrated telegraphic speech and would generate function words for description but was unable to utilize in more than a 1-2 word utterance.  Patient also required Mod A verbal and phonemic cues to self-monitor and correct phonemic errors. Patient left upright in the tilt-in-space wheelchair with quick release belt in place and all needs within reach. Continue with current plan of care.    Function:  Eating Eating   Modified Consistency Diet: Yes Eating Assist Level: Set up assist for;Supervision or verbal cues;Helper checks for pocketed food   Eating Set Up Assist For: Opening containers       Cognition Comprehension Comprehension assist level: Understands basic 75 - 89% of the time/ requires cueing 10 - 24% of the time  Expression   Expression assist level: Expresses basic 50 - 74% of the time/requires cueing 25 - 49% of the time. Needs to repeat parts of sentences.  Social Interaction Social Interaction assist level: Interacts appropriately 75 - 89% of the time - Needs redirection for appropriate language or to initiate interaction.  Problem Solving Problem solving assist level: Solves basic 50 - 74% of the time/requires cueing 25 - 49% of the time  Memory Memory assist level: Recognizes or recalls 50 - 74% of the time/requires cueing 25 - 49% of the time    Pain No/Denies Pain   Therapy/Group: Individual Therapy  Khalani Novoa 04/30/2016, 5:31 PM

## 2016-04-30 NOTE — Progress Notes (Signed)
Physical Therapy Session Note  Patient Details  Name: Abigail Campos MRN: [id number] Date of Birth: 02/12/1971  Today's Date: 04/30/2016 PT Individual Time: 1330-1445 PT Individual Time Calculation (min): 75 min   Short Term Goals: Week 4:  PT Short Term Goal 1 (Week 4): =LTGs due to ELOS  Skilled Therapeutic Interventions/Progress Updates:    Pt received resting in TIS w/c with no c/o pain and agreeable to therapy session.  Session focus on family education, car transfers, stand/pivot transfers, w/c propulsion, gait training with HW, and d/c planning.   PT instructed pt and husband, [name], in car transfer and demonstrated with max assist to pivot into car and lift LLE into car.  [name] returned demonstration with mod verbal cues, but ultimately demonstrated good body mechanics and good communication with patient during transfer to problem solve when pt felt as if she weren't set up correctly.  Will continue to practice all transfers with [name] as able throughout future sessions.   Gait training x50' with HW and mod assist for maintaining balance and advancing LLE.  Pt able to activate quad and glute in LLE 100% of the time without cues.  Pt required min verbal cues for sequencing of HW advancement and turns.  Pt did not require w/c follow for today's session of gait training.    PT instructed pt's mother in stand/pivot transfers to pt's R.  Pt's mother return demonstrated transfer with mod verbal cues from therapist but demonstrating good body mechanics and able to make corrections when needed.  Will continue to practice throughout future sessions.   Pt transitioned to ultra hemi height w/c and propelled w/c x120' back towards room with occasional min assist for avoiding obstacles on L.   Pt left upright in w/c at end of session with QRB in place, call bell in reach, and needs met.  PT provided pt's mother with hand out on ramp rental, how to build a ramp, and a home measurement sheet to fill out  prior to tomorrow's session.   Therapy Documentation Precautions:  Precautions Precautions: Fall Precaution Comments: left hemi, L inattention, impulsive Restrictions Weight Bearing Restrictions: No   See Function Navigator for Current Functional Status.   Therapy/Group: Individual Therapy  [name] 04/30/2016, 3:39 PM

## 2016-04-30 NOTE — Progress Notes (Signed)
Social Work Patient ID: [name], female   DOB: 1970/12/19, 45 y.o.   MRN: [id number]   Met with pt's spouse and mother this afternoon to confirm d/c plans.  They are reporting that d/c plan will be for pt to d/c to her mother's home in [place] and that mother, spouse and daughter will be the primary caregivers.  They also note that they have "several other people planning to help out".  Discussed need for several days of family ed and mother plans to start training on Monday.  Ongoing discussion with PT about home entry, however, stressed to both that ramp would be best option.  Provided them with info on temp ramps. Mother also to take home measurements and get info to PT/ OT.   We did discuss option of SNF prior to return home.  Spouse and mother quickly decline this as they feel pt would "give up."  Mother states, "we are going to take care of her...".  Team feels pt would definitely benefit from an extension of a couple of days. Will request this from insurance.  Mialynn Shelvin, LCSW

## 2016-04-30 NOTE — Progress Notes (Addendum)
[facility] PHYSICAL MEDICINE & REHABILITATION     PROGRESS NOTE  Subjective/Complaints:  Patient seen lying in bed this morning. Husband is asleep at bedside. Patient is awake. Her aphasia continues to improve. Today she is trying to tell me it is 2017, but states it is 1917 and when corrected she appears little frustrated and suggesting you the answer. She also notes right ear itching.  ROS: full ROS limited due to language, however appears to suggest itching in her right ear.  Objective: Vital Signs: Blood pressure 105/73, pulse 64, temperature 98.6 F (37 C), temperature source Oral, resp. rate 20, weight 66.86 kg (147 lb 6.4 oz), SpO2 100 %. No results found.  Recent Labs  04/27/16 1015  WBC 5.4  HGB 11.7*  HCT 37.0  PLT 207    Recent Labs  04/27/16 1015  NA 135  K 4.2  CL 102  GLUCOSE 125*  BUN 22*  CREATININE 1.25*  CALCIUM 9.9   CBG (last 3)  No results for input(s): GLUCAP in the last 72 hours.  Wt Readings from Last 3 Encounters:  04/29/16 66.86 kg (147 lb 6.4 oz)  04/06/16 73.6 kg (162 lb 4.1 oz)  01/22/16 74.9 kg (165 lb 2 oz)    Physical Exam:  BP 105/73 mmHg  Pulse 64  Temp(Src) 98.6 F (37 C) (Oral)  Resp 20  Wt 66.86 kg (147 lb 6.4 oz)  SpO2 100% Constitutional: She appears well-developed.  NAD HENT:  Normocephalic and atraumatic.  Eyes: Conjunctivae and EOM are normal.     Cardiovascular: Normal rate and regular rhythm.    Respiratory: Effort normal and breath sounds normal. No respiratory distress.   GI: Soft. Bowel sounds are normal. She exhibits no distension.  Musculoskeletal: She exhibits no edema or no tenderness.  Neurological: She is alert.  Inconsistently follows commands, Although improving  Expressive > receptive aphasia, with word finding difficulty, overall improving Dysarthria Left facial weakness Motor: Moving right upper and right lower extremity freely.  Not moving left side.  No spasticity noted Skin: Skin is warm and  dry.  Psychiatric:  Smiling, in good spirits  Assessment/Plan: 1. Functional deficits secondary to right basal ganglia hemorrhage which require 3+ hours per day of interdisciplinary therapy in a comprehensive inpatient rehab setting. Physiatrist is providing close team supervision and 24 hour management of active medical problems listed below. Physiatrist and rehab team continue to assess barriers to discharge/monitor patient progress toward functional and medical goals.  Function:  Bathing Bathing position Bathing activity did not occur: Refused Position: Shower  Bathing parts Body parts bathed by patient: Chest, Abdomen, Left arm, Right upper leg, Left upper leg, Right lower leg Body parts bathed by helper: Back, Left lower leg, Right arm, Front perineal area, Buttocks  Bathing assist Assist Level:  (mod A)      Upper Body Dressing/Undressing Upper body dressing Upper body dressing/undressing activity did not occur: Refused What is the patient wearing?: Pull over shirt/dress, Bra Bra - Perfomed by patient: Thread/unthread right bra strap Bra - Perfomed by helper: Thread/unthread left bra strap, Hook/unhook bra (pull down sports bra) Pull over shirt/dress - Perfomed by patient: Thread/unthread right sleeve, Put head through opening, Pull shirt over trunk Pull over shirt/dress - Perfomed by helper: Thread/unthread left sleeve        Upper body assist Assist Level:  (mod A)      Lower Body Dressing/Undressing Lower body dressing Lower body dressing/undressing activity did not occur: Refused What is the patient  wearing?: Pants, Non-skid slipper socks     Pants- Performed by patient: Thread/unthread right pants leg Pants- Performed by helper: Thread/unthread left pants leg, Pull pants up/down   Non-skid slipper socks- Performed by helper: Don/doff right sock, Don/doff left sock               TED Hose - Performed by helper: Don/doff right TED hose, Don/doff left TED hose   Lower body assist Assist for lower body dressing:  (total a)      Naval architect activity did not occur: No continent bowel/bladder event Toileting steps completed by patient: Performs perineal hygiene Toileting steps completed by helper: Adjust clothing prior to toileting, Performs perineal hygiene, Adjust clothing after toileting Toileting Assistive Devices: Grab bar or rail  Toileting assist Assist level: Two helpers   Transfers Chair/bed transfer   Chair/bed transfer method: Stand pivot Chair/bed transfer assist level: Touching or steadying assistance (Pt > 75%) Chair/bed transfer assistive device: Armrests     Locomotion Ambulation Ambulation activity did not occur: Safety/medical concerns   Max distance: 30x4 Assist level: 2 helpers (mod assist for gait, +2 for w/c follow)   Wheelchair   Type: Manual (TIS) Max wheelchair distance: 150 Assist Level: Dependent (Pt equals 0%)  Cognition Comprehension Comprehension assist level: Understands basic 75 - 89% of the time/ requires cueing 10 - 24% of the time  Expression Expression assist level: Expresses basic 50 - 74% of the time/requires cueing 25 - 49% of the time. Needs to repeat parts of sentences.  Social Interaction Social Interaction assist level: Interacts appropriately 75 - 89% of the time - Needs redirection for appropriate language or to initiate interaction.  Problem Solving Problem solving assist level: Solves basic 50 - 74% of the time/requires cueing 25 - 49% of the time  Memory Memory assist level: Recognizes or recalls 50 - 74% of the time/requires cueing 25 - 49% of the time     Medical Problem List and Plan: 1.Left hemiplegia, aphasia, dysphagia secondary to right basal ganglia hemorrhage secondary to hypertensive crisis on 04/01/16.   Continue CIR therapies  PRAFO for left lower extremity and splints for left upper extremity ordered  Fluoxetine 10 started on 5/1, Increased to 20 on 5/8 2.  DVT Prophylaxis/Anticoagulation: Subcutaneous heparin for DVT prophylaxis initiated 04/07/2016 3. Pain Management/migraine headaches: Fioricet for headaches and Robaxin. 4. Dysphagia. #2 thin liquid diet, Advanced to dysphagia 3 with thin liquids. Speech therapy follow-up. Will continue to advance diet as tolerated  Remeron changed to Marinol on 5/12, increased on 5/16.   Eating 25-50 % of meals. 5. Neuropsych: This patient is not capable of making decisions on her own behalf. 6. Skin/Wound Care: Routine skin checks 7. Fluids/Electrolytes/Nutrition: Routine I&O   BMP within acceptable range on 5/15 8. Hypertension now with hypotension,   Plendil 10 mg daily DC'd on 5/15  Hydralazine 10 mg 3 times a day, DC'd on 5/10   Lisinopril 20 mg twice a day, increased to 40 mg on 4/28, changed to 40 mg daily on 5/11, decreased to 20 mg daily on 5/12, decreased to 5 mg on 5/15, decreased to 2.5 on [id number]  Bysystolic 20 mg every 12 hours, decreased to 5 mg on 5/9  Will cont to monitor and wean medications 9. Hyperlipidemia. Lipitor 10. Spasticity: Controlled  Baclofen 5 mg on 4/27, DC'd on 5/10 11. Thrush:   Treated with Diflucan, DC'd on 5/3 12. AKI  Creatinine 1.25 on 5/15  Will encourage fluids  Labs ordered  for tomorrow 13. Right ear itching  Debrox ordered  LOS (Days) 22 A FACE TO FACE EVALUATION WAS PERFORMED  Abigail Campos [name] 04/30/2016 8:48 AM

## 2016-04-30 NOTE — Progress Notes (Signed)
Occupational Therapy Session Note  Patient Details  Name: Abigail Campos MRN: [id number] Date of Birth: 04-29-1971  Today's Date: 04/30/2016 OT Individual Time: 1001-1100 OT Individual Time Calculation (min): 59 min    Short Term Goals: Week 4:  OT Short Term Goal 1 (Week 4): STGs=LTGs secondary to upcoming discharge  Skilled Therapeutic Interventions/Progress Updates:    Upon entering the room, pt supine in bed and alert. Pt with no c/o pain and greets therapist. Pt declined bathing and dressing this session. Pt performed supine >sit with bed rails and min verbal cues for proper technique. Mod A for stand pivot transfer into wheelchair from bed. Pt verbalizing need for toileting with OT propelling wheelchair into bathroom via total A. Pt transfers from wheelchair <>standard height toilet with use of grab bar with max A. OT assisting with balance and clothing management. After pt is able to void, she is able to perform hygiene with therapist providing max A for standing balance during task. Grooming at sink with set up A to open containers and min verbal cues for encouragement and technique. Pt requiring hand over hand assist to apply lotion to R UE and min cues for awareness to locate and apply lotion to L UE. OT noted skin blister on L thumb possibility from hand brace. OT did not don brace and notified RN. OT propelled wheelchair to day room with moderate external distractions present. Pt needing mod- verbal questioning cues for pt to correctly sequence 4 step cards of pictures of functional tasks. Pt needing mod verbal questioning cues for 6 step sequence cards. Pt remained in wheelchair at table and transitioning to SLP session without difficulty.   Therapy Documentation Precautions:  Precautions Precautions: Fall Precaution Comments: left hemi, L inattention, impulsive Restrictions Weight Bearing Restrictions: No Vital Signs: Therapy Vitals Temp: 99 F (37.2 C) Temp Source:  Oral Pulse Rate: 74 Resp: 18 BP: 121/80 mmHg Patient Position (if appropriate): Sitting Oxygen Therapy SpO2: 100 % O2 Device: Not Delivered  See Function Navigator for Current Functional Status.   Therapy/Group: Individual Therapy  Phineas Semen 04/30/2016, 4:47 PM

## 2016-05-01 ENCOUNTER — Inpatient Hospital Stay (HOSPITAL_COMMUNITY): Payer: Managed Care, Other (non HMO) | Admitting: Physical Therapy

## 2016-05-01 ENCOUNTER — Inpatient Hospital Stay (HOSPITAL_COMMUNITY): Payer: Managed Care, Other (non HMO) | Admitting: Occupational Therapy

## 2016-05-01 ENCOUNTER — Inpatient Hospital Stay (HOSPITAL_COMMUNITY): Payer: Managed Care, Other (non HMO) | Admitting: Speech Pathology

## 2016-05-01 LAB — BASIC METABOLIC PANEL
Anion gap: 9 (ref 5–15)
BUN: 13 mg/dL (ref 6–20)
CO2: 27 mmol/L (ref 22–32)
Calcium: 9.6 mg/dL (ref 8.9–10.3)
Chloride: 100 mmol/L — ABNORMAL LOW (ref 101–111)
Creatinine, Ser: 1.01 mg/dL — ABNORMAL HIGH (ref 0.44–1.00)
GFR calc Af Amer: >60 mL/min (ref >60)
GFR calc non Af Amer: >60 mL/min (ref >60)
Glucose, Bld: 111 mg/dL — ABNORMAL HIGH (ref 65–99)
Potassium: 4.5 mmol/L (ref 3.5–5.1)
Sodium: 136 mmol/L (ref 135–145)

## 2016-05-01 NOTE — Progress Notes (Signed)
Physical Therapy Session Note  Patient Details  Name: Abigail Campos MRN: [id number] Date of Birth: February 04, 1971  Today's Date: 05/01/2016 PT Individual Time: 1006-1100 PT Individual Time Calculation (min): 54 min   Short Term Goals: Week 4:  PT Short Term Goal 1 (Week 4): =LTGs due to ELOS  Skilled Therapeutic Interventions/Progress Updates:    Pt received resting in w/c, c/o heartburn, but agreeable to therapy session.  Session focus on car transfers and family education with pt's mother, [name].    Pt taken off unit to practice real car transfer with mom's van.  Upon reaching car, PT attempted transfer with pt, but due to pt's decreased motor control in L side and height as well as height of the car seat, unable to safely transfer pt with a reasonable amount of assist.  Pt returned to w/c and discussed use of pt's husband's sedan for pt transport at d/c.  Returned to unit in w/c and PT instructed [name] in car transfer at [place] height in simulation car.  Pt performed transfers x2 (once with PT with max assist for pivot, min assist for sit<>stand and once with her mother).  Pt propelled w/c back towards room x150' with supervision and decreasing need for verbal cues to attend to L side.  Handoff to SLP at end of session.    Therapy Documentation Precautions:  Precautions Precautions: Fall Precaution Comments: left hemi, L inattention, impulsive Restrictions Weight Bearing Restrictions: No   See Function Navigator for Current Functional Status.   Therapy/Group: Individual Therapy  Jack Bolio E Penven-Crew 05/01/2016, 12:09 PM

## 2016-05-01 NOTE — Progress Notes (Signed)
Speech Language Pathology Weekly Progress and Session Note  Patient Details  Name: Abigail Campos MRN: [id number] Date of Birth: 16-Jan-1971  Beginning of progress report period: Apr 24, 2016 End of progress report period: May 01, 2016  Today's Date: 05/01/2016 SLP Individual Time: 1100-1200 SLP Individual Time Calculation (min): 60 min  Short Term Goals:  Week 3: SLP Short Term Goal 1 (Week 3): Patient will consume current diet with minimal overt s/s of aspiration with Min A verbal cues for use of swallowing strategies. Met SLP Short Term Goal 2 (Week 3): Patient will demonstrate sustained attention to functional tasks for 15 minutes with Min A verbal cues for redirection. Met SLP Short Term Goal 3 (Week 3): Patient will attend to left field of enviornment during functional tasks with Min A verbal ces. Met SLP Short Term Goal 4 (Week 3): Patient will answer basic yes/no questions with 75% accuracy in regards to wants/needs with Min A multimodal cues. Not Met SLP Short Term Goal 5 (Week 3): Patient will name common objects with 75% accuracy with Min A multimodal cues. Met SLP Short Term Goal 6 (Week 3): Patient will self-monitor and correct phonemic errors at the word level in 50% of opportunities with Max A verbal cues. Not Met    New Short Term Goals: Week 4: SLP Short Term Goal 1 (Week 4): Patient will consume current diet with minimal overt s/s of aspiration with supervision verbal cues for use of swallowing strategies.  SLP Short Term Goal 2 (Week 4): Patient will demonstrate selective attention to functional tasks for 30 minutes with supervision verbal cues for redirection.  SLP Short Term Goal 3 (Week 4): Patient will attend to left field of enviornment during functional tasks with supervision verbal ces.  SLP Short Term Goal 4 (Week 4): Patient will answer basic yes/no questions with 75% accuracy in regards to wants/needs with Min A multimodal cues.  SLP Short Term Goal 5  (Week 4): Patient will communicate her wants/needs at the phrase level (3-4 word utterances) with Max A multimodal cues. SLP Short Term Goal 6 (Week 4): Patient will self-monitor and correct phonemic errors at the word level in 75% of opportunities with Min A verbal cues.    Weekly Progress Updates: Patient has made functional gains and has met 5 of 6 STG's this reporting period due to improved swallowing and cognitive-linguistic function. Currently, patient is consuming Dys. 3 textures with thin liquids without overt s/s of aspiration and requires overall Min A for use of swallowing compensatory strategies. Patient also requires overall Mod A verbal and visual cues to complete functional and familiar tasks safely in regards to selective attention, problem solving, attention to left field of environment, recall and overall safety. Patient can communicate her wants/needs at the word level and requires overall Mod A multimodal cues to self-monitor and correct errors and Max A to communicate at the word level. Patient and family education is ongoing. Patient would benefit from continued skilled SLP intervention to maximize her cognitive-linguistic and swallowing function in order to maximize her overall functional independence prior to discharge.     Intensity: Minumum of 1-2 x/day, 30 to 90 minutes Frequency: 3 to 5 out of 7 days Duration/Length of Stay: 5/26 Treatment/Interventions: Cognitive remediation/compensation;Cueing hierarchy;Dysphagia/aspiration precaution training;Functional tasks;Patient/family education;Therapeutic Activities;Speech/Language facilitation;Environmental controls;Internal/external aids   Daily Session  Skilled Therapeutic Interventions: Skilled treatment session focused on dysphagia and cognitive-linguistic goals. SLP facilitated session by providing Min A verbal cues and extra time for patient to attend  to left field of environment while propelling her wheelchair to and from  the dayroom. Patient consumed Dys. 3 textures with thin liquids without overt s/s of aspiration and required supervision-Min A verbal cues for use of small bites and to monitor left anterior spillage. SLP also facilitated session by providing Mod A verbal and visual cues for patient to self-monitor and correct phonemic paraphasias during a structured sentence generation task with use of pictures. Patient's mother present and provided appropriate cuing. Patient left upright in wheelchair with mother present. Continue with current plan of care.       Function:   Eating Eating   Modified Consistency Diet: Yes Eating Assist Level: Set up assist for;Supervision or verbal cues;Helper checks for pocketed food   Eating Set Up Assist For: Opening containers       Cognition Comprehension Comprehension assist level: Understands basic 75 - 89% of the time/ requires cueing 10 - 24% of the time  Expression   Expression assist level: Expresses basic 50 - 74% of the time/requires cueing 25 - 49% of the time. Needs to repeat parts of sentences.  Social Interaction Social Interaction assist level: Interacts appropriately 75 - 89% of the time - Needs redirection for appropriate language or to initiate interaction.  Problem Solving Problem solving assist level: Solves basic 50 - 74% of the time/requires cueing 25 - 49% of the time  Memory Memory assist level: Recognizes or recalls 50 - 74% of the time/requires cueing 25 - 49% of the time   Pain No/Denies Pain   Therapy/Group: Individual Therapy  Dyon Rotert 05/01/2016, 2:04 PM

## 2016-05-01 NOTE — Progress Notes (Signed)
Occupational Therapy Session Note  Patient Details  Name: Abigail Campos MRN: [id number] Date of Birth: 1971-11-21  Today's Date: 05/01/2016 OT Individual Time: 1300-1331 OT Individual Time Calculation (min): 31 min    Skilled Therapeutic Interventions/Progress Updates:    Worked on family education with pt's mother with regards to squat pivot transfers from bed to drop arm commode.  Pt's mother was able to return demonstrate performance of transfers with mod instructional cueing for technique.  Continued work on allowing pt to complete forward trunk flexion before initiating sit to stand or for stand pivot transfer.  Per daughter they have been performing more stand pivot transfer (without stepping with the LEs) instead of squat pivot.  Feel pt's mother did well including with sit to stand for LB selfcare.  Would recommend more practice over the next week in order to increase efficiency.  Pt left in bed with family present secondary to fatigue.    Therapy Documentation Precautions:  Precautions Precautions: Fall Precaution Comments: left hemi, L inattention, impulsive Restrictions Weight Bearing Restrictions: No  Pain: Pain Assessment Pain Assessment: Faces Faces Pain Scale: Hurts little more Pain Type: Acute pain Pain Location: Shoulder Pain Orientation: Left Pain Intervention(s): Repositioned ADL: See Function Navigator for Current Functional Status.   Therapy/Group: Individual Therapy  Krisanne Lich OTR/L 05/01/2016, 3:34 PM

## 2016-05-01 NOTE — Progress Notes (Signed)
[facility]IC[facility] & RE[facility]ABILITATION     PROGRESS NOTE  Subjective/Complaints:  Patient lying in bed without complaints. Denies pain. Smiling   ROS: full ROS limited due to language.  Objective: Vital Signs: Blood pressure 111/65, pulse 65, temperature 98.6 F (37 C), temperature source Oral, resp. rate 19, weight 66.86 kg (147 lb 6.4 oz), SpO2 100 %. No results found. No results for input(s): WBC, HGB, HCT, PLT in the last 72 hours.  Recent Labs  05/01/16 0739  NA 136  K 4.5  CL 100*  GLUCOSE 111*  BUN 13  CREATININE 1.01*  CALCIUM 9.6   CBG (last 3)  No results for input(s): GLUCAP in the last 72 hours.  Wt Readings from Last 3 Encounters:  04/29/16 66.86 kg (147 lb 6.4 oz)  04/06/16 73.6 kg (162 lb 4.1 oz)  01/22/16 74.9 kg (165 lb 2 oz)    Physical Exam:  BP 111/65 mmHg  Pulse 65  Temp(Src) 98.6 F (37 C) (Oral)  Resp 19  Wt 66.86 kg (147 lb 6.4 oz)  SpO2 100% Constitutional: She appears well-developed.  NAD HENT:  Normocephalic and atraumatic.  Eyes: Conjunctivae and EOM are normal.     Cardiovascular: Normal rate and regular rhythm.    Respiratory: Effort normal and breath sounds normal. No respiratory distress.   GI: Soft. Bowel sounds are normal. She exhibits no distension.  Musculoskeletal: She exhibits no edema or no tenderness.  Neurological: She is alert.  Inconsistently follows commands, Although improving  Expressive > receptive aphasia, with word finding difficulty. Gradually improving Dysarthria Left facial weakness Motor: Moving right upper and right lower extremity freely.  Not moving left side.  No spasticity noted.  Skin: Skin is warm and dry.  Psychiatric:  Smiling, cooperative  Assessment/Plan: 1. Functional deficits secondary to right basal ganglia hemorrhage which require 3+ hours per day of interdisciplinary therapy in a comprehensive inpatient rehab setting. Physiatrist is providing close team supervision and 24 hour  management of active medical problems listed below. Physiatrist and rehab team continue to assess barriers to discharge/monitor patient progress toward functional and medical goals.  Function:  Bathing Bathing position Bathing activity did not occur: Refused Position: Shower  Bathing parts Body parts bathed by patient: Chest, Abdomen, Left arm, Right upper leg, Left upper leg, Right lower leg Body parts bathed by helper: Back, Left lower leg, Right arm, Front perineal area, Buttocks  Bathing assist Assist Level:  (mod A)      Upper Body Dressing/Undressing Upper body dressing Upper body dressing/undressing activity did not occur: Refused What is the patient wearing?: Pull over shirt/dress, Bra Bra - Perfomed by patient: Thread/unthread right bra strap Bra - Perfomed by helper: Thread/unthread left bra strap, Hook/unhook bra (pull down sports bra) Pull over shirt/dress - Perfomed by patient: Thread/unthread right sleeve, Put head through opening, Pull shirt over trunk Pull over shirt/dress - Perfomed by helper: Thread/unthread left sleeve        Upper body assist Assist Level:  (mod A)      Lower Body Dressing/Undressing Lower body dressing Lower body dressing/undressing activity did not occur: Refused What is the patient wearing?: Pants, Non-skid slipper socks     Pants- Performed by patient: Thread/unthread right pants leg Pants- Performed by helper: Thread/unthread left pants leg, Pull pants up/down   Non-skid slipper socks- Performed by helper: Don/doff right sock, Don/doff left sock               TED Hose - Performed  by helper: Don/doff right TED hose, Don/doff left TED hose  Lower body assist Assist for lower body dressing:  (total a)      Toileting Toileting Toileting activity did not occur: No continent bowel/bladder event Toileting steps completed by patient: Performs perineal hygiene Toileting steps completed by helper: Adjust clothing prior to toileting,  Adjust clothing after toileting Toileting Assistive Devices: Grab bar or rail  Toileting assist Assist level:  (mod A)   Transfers Chair/bed transfer   Chair/bed transfer method: Stand pivot Chair/bed transfer assist level: Moderate assist (Pt 50 - 74%/lift or lower) Chair/bed transfer assistive device: Armrests     Locomotion Ambulation Ambulation activity did not occur: Safety/medical concerns   Max distance: 50 Assist level: Moderate assist (Pt 50 - 74%)   Wheelchair   Type: Manual (TIS) Max wheelchair distance: 150 Assist Level: Dependent (Pt equals 0%)  Cognition Comprehension Comprehension assist level: Understands basic 75 - 89% of the time/ requires cueing 10 - 24% of the time  Expression Expression assist level: Expresses basic 50 - 74% of the time/requires cueing 25 - 49% of the time. Needs to repeat parts of sentences.  Social Interaction Social Interaction assist level: Interacts appropriately 75 - 89% of the time - Needs redirection for appropriate language or to initiate interaction.  Problem Solving Problem solving assist level: Solves basic 50 - 74% of the time/requires cueing 25 - 49% of the time  Memory Memory assist level: Recognizes or recalls 50 - 74% of the time/requires cueing 25 - 49% of the time     Medical Problem List and Plan: 1.Left hemiplegia, aphasia, dysphagia secondary to right basal ganglia hemorrhage secondary to hypertensive crisis on 04/01/16.   Continue CIR therapies  PRAFO for left lower extremity and splints for left upper extremity ordered  Fluoxetine 10 started on 5/1, Increased to 20 on 5/8 2. DVT Prophylaxis/Anticoagulation: Subcutaneous heparin for DVT prophylaxis  3. Pain Management/migraine headaches: Fioricet for headaches and Robaxin. 4. Dysphagia. #2 thin liquid diet, Advanced to dysphagia 3 with thin liquids. Speech therapy follow-up. Will continue to advance diet as tolerated  Remeron changed to Marinol on 5/12, increased on  5/16.   Eating 25-50 % of meals. 5. Neuropsych: This patient is not capable of making decisions on her own behalf. 6. Skin/Wound Care: Routine skin checks 7. Fluids/Electrolytes/Nutrition: Routine I&O   BMP within acceptable range on 5/15 8. Hypertension now with hypotension,   Plendil 10 mg daily DC'd on 5/15  Hydralazine 10 mg 3 times a day, DC'd on 5/10   Lisinopril 20 mg twice a day, increased to 40 mg on 4/28, changed to 40 mg daily on 5/11, decreased to 20 mg daily on 5/12, decreased to 5 mg on 5/15, decreased to 2.5 on [id number]  Bysystolic 20 mg every 12 hours, decreased to 5 mg on 5/9  Will cont to monitor and wean medications 9. Hyperlipidemia. Lipitor 10. Spasticity: Controlled  Baclofen 5 mg on 4/27, DC'd on 5/10 11. Thrush:   Treated with Diflucan, DC'd on 5/3 12. AKI  Creatinine 1.01 today. I personally reviewed the patient's labs today.   -continue to encourage fluids 13. Right ear itching  Debrox ordered with benefit?  LOS (Days) 23 A FACE TO FACE EVALUATION WAS PERFORMED  Lael Pilch T 05/01/2016 8:58 AM

## 2016-05-01 NOTE — Progress Notes (Signed)
Occupational Therapy Session Note  Patient Details  Name: Abigail Campos MRN: [id number] Date of Birth: 02/01/1971  Today's Date: 05/01/2016 OT Individual Time: 0900-1000 OT Individual Time Calculation (min): 60 min    Short Term Goals: Week 4:  OT Short Term Goal 1 (Week 4): STGs=LTGs secondary to upcoming discharge  Skilled Therapeutic Interventions/Progress Updates:  Upon entering the room, pt supine in bed with her mother present in the room. [name], her mother, is present for education for upcoming discharge home. She reports that wheelchair will not fit into bathroom. OT recommended family does not ambulate with pt and therefore no shower equipment will be ordered at discharge until pt has progressed to a safe level for family to assist her into bathroom. Caregiver requested based on home set up that pt would perform self care tasks from EOB. Pt performed supine >sit without bedrail and needing steady assist to sit onto EOB. Caregiver seated next to pt to assist as needed and provide verbal cues for safety and sequencing. Pt verbalizing need to toilet. Her mother transferred her from bed > drop arm commode with min cues from therapist for body mechanics and foot placement. Pt returned to supine for hygiene and LB self care. Pt rolled L <> R with supervision and verbal cues in order for caregiver to pull clothing over B hips. Pt transferred back into wheelchair at end of session with caregiver making improvements on technique. Pt remaining in wheelchair with call bell and all needed items within reach upon exiting the room. Education to continue.   Therapy Documentation Precautions:  Precautions Precautions: Fall Precaution Comments: left hemi, L inattention, impulsive Restrictions Weight Bearing Restrictions: No  See Function Navigator for Current Functional Status.   Therapy/Group: Individual Therapy  Phineas Semen 05/01/2016, 11:25 AM

## 2016-05-02 ENCOUNTER — Inpatient Hospital Stay (HOSPITAL_COMMUNITY): Payer: Managed Care, Other (non HMO) | Admitting: Physical Therapy

## 2016-05-02 NOTE — Progress Notes (Signed)
Patient ID: Abigail Campos, female   DOB: 07/16/1971, 45 y.o.   MRN: [id number]  05/02/16.  [facility]     PROGRESS NOTE  45 y/o admit for CIR with Left hemiplegia, aphasia, dysphagia secondary to right basal ganglia hemorrhage secondary to hypertensive crisis on 04/01/16.   Subjective/Complaints:  Patient lying in bed without complaints. Denies pain.    ROS: full ROS limited due to language.  Past Medical History  Diagnosis Date  . Headache(784.0)     developed migraines after 2nd pregnancy, had a negative CT of the head  . Hypertension   . Essential hypertension 10/19/2007    03-2010: metoprolol changed to bystolic (was not feeling well on it, no specific allergy or reaction)       Objective: Vital Signs: Blood pressure 102/68, pulse 60, temperature 98.7 F (37.1 C), temperature source Oral, resp. rate 17, weight 147 lb 6.4 oz (66.86 kg), SpO2 100 %. No results found. No results for input(s): WBC, HGB, HCT, PLT in the last 72 hours.  Recent Labs  05/01/16 0739  NA 136  K 4.5  CL 100*  GLUCOSE 111*  BUN 13  CREATININE 1.01*  CALCIUM 9.6   CBG (last 3)  No results for input(s): GLUCAP in the last 72 hours.  Wt Readings from Last 3 Encounters:  04/29/16 147 lb 6.4 oz (66.86 kg)  04/06/16 162 lb 4.1 oz (73.6 kg)  01/22/16 165 lb 2 oz (74.9 kg)   BP Readings from Last 3 Encounters:  05/02/16 102/68  04/08/16 145/86  01/22/16 132/90   Physical Exam:  BP 102/68 mmHg  Pulse 60  Temp(Src) 98.7 F (37.1 C) (Oral)  Resp 17  Wt 147 lb 6.4 oz (66.86 kg)  SpO2 100% Constitutional: She appears well-developed.  NAD HENT:  Normocephalic and atraumatic.  Eyes: Conjunctivae and EOM are normal.     Cardiovascular: Normal rate and regular rhythm.    Respiratory: Effort normal and breath sounds normal. No respiratory distress.   GI: Soft. Bowel sounds are normal. She exhibits no distension.  Musculoskeletal: She exhibits no edema or  no tenderness.  L ankle brace Neurological: She is alert.   Expressive > receptive aphasia, with word finding difficulty. Gradually improving Dysarthria Left facial weakness  Not moving left side.   Skin: Skin is warm and dry.  Psychiatric:  Smiling, cooperative    Medical Problem List and Plan: 1.Left hemiplegia, aphasia, dysphagia secondary to right basal ganglia hemorrhage secondary to hypertensive crisis on 04/01/16.   Continue CIR therapies  PRAFO for left lower extremity and splints for left upper extremity ordered   2. DVT Prophylaxis/Anticoagulation: Subcutaneous heparin for DVT prophylaxis   3. Dysphagia. #2 thin liquid diet, Advanced to dysphagia 3 with thin liquids. Speech therapy follow-up. Will continue to advance diet as tolerated  Remeron changed to Marinol on 5/12, increased on 5/16.    4. Hypertension now with hypotension,   Plendil 10 mg daily DC'd on 5/15  Hydralazine 10 mg 3 times a day, DC'd on 5/10   Lisinopril 20 mg twice a day, increased to 40 mg on 4/28, changed to 40 mg daily on 5/11, decreased to 20 mg daily on 5/12, decreased to 5 mg on 5/15, decreased to 2.5 on [id number]  Bysystolic 20 mg every 12 hours, decreased to 5 mg on 5/9  Will cont to monitor and wean medications   LOS (Days) 24 A FACE TO FACE EVALUATION WAS PERFORMED  Nyoka Cowden 05/02/2016  9:57 AM

## 2016-05-02 NOTE — Progress Notes (Signed)
Physical Therapy Session Note  Patient Details  Name: Abigail Campos MRN: [id number] Date of Birth: Feb 23, 1971  Today's Date: 05/02/2016 PT Individual Time: 1000-1030 PT Individual Time Calculation (min): 30 min   Short Term Goals: Week 4:  PT Short Term Goal 1 (Week 4): =LTGs due to ELOS  Skilled Therapeutic Interventions/Progress Updates:    Pt received resting in bed with no c/o pain and agreeable to therapy session.  Session focus on transfers, w/c mobility, pt education, and d/c planning.  Pt transitions supine>sitting EOB with HOB flat using bedrails with supervision and verbal cues for technique.  Stand/pivot from EOB to w/c with min assist to facilitate forward weight shift and block L knee.  Pt propelled w/c to therapy gym with supervision using R hemi technique.  PT instructed pt in w/c obstacle course initially focus on compliant surfaces (min assist to propel across floor mat) progress to focus on obstacle negotiation.  Pt required max verbal cues for effective obstacle negotiation fade to supervision with extensive practice.  PT and pt discussed upcoming d/c next week including ongoing family training for transfers, the best car to use for transportation, and possibility of short extension in LOS.  Pt verbalized understanding at all and returned to room in w/c total assist for time management.  Pt left upright in w/c with QRB in place, call bell in reach, and needs met.   Therapy Documentation Precautions:  Precautions Precautions: Fall Precaution Comments: left hemi, L inattention, impulsive Restrictions Weight Bearing Restrictions: No   See Function Navigator for Current Functional Status.   Therapy/Group: Individual Therapy  [name] 05/02/2016, 12:27 PM

## 2016-05-03 NOTE — Plan of Care (Signed)
Problem: RH KNOWLEDGE DEFICIT Goal: RH STG INCREASE KNOWLEDGE OF DYSPHAGIA/FLUID INTAKE Outcome: Not Progressing Pt will be able to explain dysphagia precautions and rationale for restrictions. PT will be able to appropriately order food on the dyphagia list with cues/suggestions.

## 2016-05-03 NOTE — Progress Notes (Signed)
Patient ID: LUCIELLE ITO, female   DOB: 04/05/1971, 45 y.o.   MRN: [id number]   Patient ID: LAURI WALTENBAUGH, female   DOB: December 21, 1970, 45 y.o.   MRN: [id number]  05/03/16.  [place] [facility]     PROGRESS NOTE  45 y/o admit for CIR with Left hemiplegia, aphasia, dysphagia secondary to right basal ganglia hemorrhage secondary to hypertensive crisis on 04/01/16.   Subjective/Complaints:  Patient lying in bed without complaints. Denies pain.  Slept well last night   ROS: full ROS limited due to language.  Past Medical History  Diagnosis Date  . Headache(784.0)     developed migraines after 2nd pregnancy, had a negative CT of the head  . Hypertension   . Essential hypertension 10/19/2007    03-2010: metoprolol changed to bystolic (was not feeling well on it, no specific allergy or reaction)       Objective: Vital Signs: Blood pressure 100/60, pulse 60, temperature 98.6 F (37 C), temperature source Oral, resp. rate 17, weight 147 lb 6.4 oz (66.86 kg), SpO2 99 %. No results found. No results for input(s): WBC, HGB, HCT, PLT in the last 72 hours.  Recent Labs  05/01/16 0739  NA 136  K 4.5  CL 100*  GLUCOSE 111*  BUN 13  CREATININE 1.01*  CALCIUM 9.6   CBG (last 3)  No results for input(s): GLUCAP in the last 72 hours.  Wt Readings from Last 3 Encounters:  04/29/16 147 lb 6.4 oz (66.86 kg)  04/06/16 162 lb 4.1 oz (73.6 kg)  01/22/16 165 lb 2 oz (74.9 kg)   BP Readings from Last 3 Encounters:  05/03/16 100/60  04/08/16 145/86  01/22/16 132/90   Physical Exam:  BP 100/60 mmHg  Pulse 60  Temp(Src) 98.6 F (37 C) (Oral)  Resp 17  Wt 147 lb 6.4 oz (66.86 kg)  SpO2 99% Constitutional: She appears well-developed.  NAD HENT:  Normocephalic and atraumatic.  Eyes: Conjunctivae and EOM are normal.     Cardiovascular: Normal rate and regular rhythm.    Respiratory: Effort normal and breath sounds normal. No respiratory distress.   GI:  Soft. Bowel sounds are normal. She exhibits no distension.  Musculoskeletal: She exhibits no edema or no tenderness.  L ankle brace Neurological: She is alert.   Expressive > receptive aphasia, with word finding difficulty. Gradually improving Dysarthria Left facial weakness  Not moving left side.   Skin: Skin is warm and dry.  Psychiatric:  Smiling, cooperative    Medical Problem List and Plan: 1.Left hemiplegia, aphasia, dysphagia secondary to right basal ganglia hemorrhage secondary to hypertensive crisis on 04/01/16.   Continue CIR therapies  PRAFO for left lower extremity and splints for left upper extremity ordered   2. DVT Prophylaxis/Anticoagulation: Subcutaneous heparin for DVT prophylaxis   3. Dysphagia. #2 thin liquid diet, Advanced to dysphagia 3 with thin liquids. Speech therapy follow-up. Will continue to advance diet as tolerated  Remeron changed to Marinol on 5/12, increased on 5/16.    4. Hypertension now with hypotension,   Plendil 10 mg daily DC'd on 5/15  Hydralazine 10 mg 3 times a day, DC'd on 5/10   Lisinopril 20 mg twice a day, increased to 40 mg on 4/28, changed to 40 mg daily on 5/11, decreased to 20 mg daily on 5/12, decreased to 5 mg on 5/15, decreased to 2.5 on [id number]  Bysystolic 20 mg every 12 hours, decreased to 5 mg on 5/9  Will  cont to monitor and wean medications   LOS (Days) 25 A FACE TO FACE EVALUATION WAS PERFORMED  Nyoka Cowden 05/03/2016 8:57 AM

## 2016-05-03 NOTE — Plan of Care (Signed)
Problem: RH KNOWLEDGE DEFICIT Goal: RH STG INCREASE KNOWLEDGE OF DYSPHAGIA/FLUID INTAKE Pt's family returned meal as it was "not correct" she is on regular diet. Noted D3 diet in computer and on SLP note. REviewed D3 food pt could have and three family members noted that SLP had ordered cheese snadiwch and pt had received grilled cheese sandwich on D3 diet even though kitchen would not allow due to diet restriction of "toast" and hard bread. Explain foods allowed for this patient and family noted her diet had been changed. Noted that according to chart D2 upgraded to D3 however pt was still on soft diet. REviewed foods allowed on D3 diet and gave a list of foods allowed and those to avoid and reviewed with multiple family and the patient. Re ordered dinner tray for this patient based on D3 allowences. PT reported all food suggested were "gross" and did not taste good. PT talking about chicken fingers, grilled cheese, fried foods and was not happy to discuss low salt, low fat, heart healthy diet choices to decrease risk of another stroke.

## 2016-05-04 ENCOUNTER — Inpatient Hospital Stay (HOSPITAL_COMMUNITY): Payer: Managed Care, Other (non HMO) | Admitting: Physical Therapy

## 2016-05-04 ENCOUNTER — Inpatient Hospital Stay (HOSPITAL_COMMUNITY): Payer: Managed Care, Other (non HMO) | Admitting: Speech Pathology

## 2016-05-04 ENCOUNTER — Inpatient Hospital Stay (HOSPITAL_COMMUNITY): Payer: Managed Care, Other (non HMO) | Admitting: Occupational Therapy

## 2016-05-04 MED ORDER — BACLOFEN 5 MG HALF TABLET
5.0000 mg | ORAL_TABLET | Freq: Three times a day (TID) | ORAL | Status: DC
  Administered 2016-05-04 – 2016-05-08 (×14): 5 mg via ORAL
  Filled 2016-05-04 (×13): qty 1

## 2016-05-04 NOTE — Progress Notes (Signed)
[place] [facility]     PROGRESS NOTE  Subjective/Complaints:  Patient seen lying in bed this morning. She is in good spirits. Her husband notes synergistic patterns over the weekend. Patient's aphasia continues to improve.  ROS: full ROS limited due to language, but appears to deny CP, SOB, nausea, vomiting, diarrhea..  Objective: Vital Signs: Blood pressure 105/64, pulse 59, temperature 98.1 F (36.7 C), temperature source Oral, resp. rate 16, weight 66.86 kg (147 lb 6.4 oz), SpO2 99 %. No results found. No results for input(s): WBC, HGB, HCT, PLT in the last 72 hours. No results for input(s): NA, K, CL, GLUCOSE, BUN, CREATININE, CALCIUM in the last 72 hours.  Invalid input(s): CO CBG (last 3)  No results for input(s): GLUCAP in the last 72 hours.  Wt Readings from Last 3 Encounters:  04/29/16 66.86 kg (147 lb 6.4 oz)  04/06/16 73.6 kg (162 lb 4.1 oz)  01/22/16 74.9 kg (165 lb 2 oz)    Physical Exam:  BP 105/64 mmHg  Pulse 59  Temp(Src) 98.1 F (36.7 C) (Oral)  Resp 16  Wt 66.86 kg (147 lb 6.4 oz)  SpO2 99% Constitutional: She appears well-developed.  NAD HENT:  Normocephalic and atraumatic.  Eyes: Conjunctivae and EOM are normal.     Cardiovascular: Normal rate and regular rhythm.    Respiratory: Effort normal and breath sounds normal. No respiratory distress.   GI: Soft. Bowel sounds are normal. She exhibits no distension.  Musculoskeletal: She exhibits no edema or no tenderness.  Neurological: She is alert.  Inconsistently follows commands, Although Significantly improved   mainly expressive aphasia, with minimal receptive deficits, with word finding difficulty.  Dysarthria Left facial weakness Motor: Moving right upper and right lower extremity freely.  Left upper/lower extremity: 0/5.  Modified [place] 1/4 left upper extremity..  Skin: Skin is warm and dry.  Psychiatric:  Smiling, cooperative  Assessment/Plan: 1. Functional  deficits secondary to right basal ganglia hemorrhage which require 3+ hours per day of interdisciplinary therapy in a comprehensive inpatient rehab setting. Physiatrist is providing close team supervision and 24 hour management of active medical problems listed below. Physiatrist and rehab team continue to assess barriers to discharge/monitor patient progress toward functional and medical goals.  Function:  Bathing Bathing position Bathing activity did not occur: Refused Position: Sitting EOB  Bathing parts Body parts bathed by patient: Chest, Abdomen, Left arm, Right upper leg, Left upper leg, Right lower leg, Front perineal area Body parts bathed by helper: Back, Left lower leg, Right arm, Buttocks  Bathing assist Assist Level:  (mod A)      Upper Body Dressing/Undressing Upper body dressing Upper body dressing/undressing activity did not occur: Refused What is the patient wearing?: Pull over shirt/dress, Bra Bra - Perfomed by patient: Thread/unthread right bra strap Bra - Perfomed by helper: Thread/unthread left bra strap, Hook/unhook bra (pull down sports bra) Pull over shirt/dress - Perfomed by patient: Thread/unthread right sleeve, Put head through opening, Pull shirt over trunk Pull over shirt/dress - Perfomed by helper: Thread/unthread left sleeve        Upper body assist Assist Level:  (mod A)      Lower Body Dressing/Undressing Lower body dressing Lower body dressing/undressing activity did not occur: Refused What is the patient wearing?: Pants, Non-skid slipper socks     Pants- Performed by patient: Thread/unthread right pants leg Pants- Performed by helper: Thread/unthread left pants leg, Pull pants up/down   Non-skid slipper socks- Performed by helper: Don/doff  right sock, Don/doff left sock               TED Hose - Performed by helper: Don/doff right TED hose, Don/doff left TED hose  Lower body assist Assist for lower body dressing:  (total a)       Toileting Toileting Toileting activity did not occur: No continent bowel/bladder event Toileting steps completed by patient: Performs perineal hygiene Toileting steps completed by helper: Adjust clothing prior to toileting, Adjust clothing after toileting Toileting Assistive Devices: Grab bar or rail  Toileting assist Assist level:  (mod a)   Transfers Chair/bed transfer   Chair/bed transfer method: Stand pivot Chair/bed transfer assist level: Touching or steadying assistance (Pt > 75%) Chair/bed transfer assistive device: Armrests     Locomotion Ambulation Ambulation activity did not occur: Safety/medical concerns   Max distance: 50 Assist level: Moderate assist (Pt 50 - 74%)   Wheelchair   Type: Manual Max wheelchair distance: 200 Assist Level: Supervision or verbal cues  Cognition Comprehension Comprehension assist level: Understands basic 75 - 89% of the time/ requires cueing 10 - 24% of the time  Expression Expression assist level: Expresses basic 50 - 74% of the time/requires cueing 25 - 49% of the time. Needs to repeat parts of sentences.  Social Interaction Social Interaction assist level: Interacts appropriately 75 - 89% of the time - Needs redirection for appropriate language or to initiate interaction.  Problem Solving Problem solving assist level: Solves basic 50 - 74% of the time/requires cueing 25 - 49% of the time  Memory Memory assist level: Recognizes or recalls 50 - 74% of the time/requires cueing 25 - 49% of the time     Medical Problem List and Plan: 1.Left hemiplegia, aphasia, dysphagia secondary to right basal ganglia hemorrhage secondary to hypertensive crisis on 04/01/16.   Continue CIR therapies  PRAFO for left lower extremity and splints for left upper extremity ordered  Fluoxetine 10 started on 5/1, Increased to 20 on 5/8 2. DVT Prophylaxis/Anticoagulation: Subcutaneous heparin for DVT prophylaxis  3. Pain Management/migraine headaches: Fioricet for  headaches and Robaxin. 4. Dysphagia. #2 thin liquid diet, Advanced to dysphagia 3 with thin liquids. Speech therapy follow-up. Will continue to advance diet as tolerated  Remeron changed to Marinol on 5/12, increased on 5/16.   Eating 25-50 % of meals. 5. Neuropsych: This patient is not capable of making decisions on her own behalf. 6. Skin/Wound Care: Routine skin checks 7. Fluids/Electrolytes/Nutrition: Routine I&O   BMP within acceptable range on 5/15 8. Hypertension now with hypotension,   Plendil 10 mg daily DC'd on 5/15  Hydralazine 10 mg 3 times a day, DC'd on 5/10   Lisinopril 20 mg twice a day, increased to 40 mg on 4/28, changed to 40 mg daily on 5/11, decreased to 20 mg daily on 5/12, decreased to 5 mg on 5/15, decreased to 2.5 on [id number]  Bysystolic 20 mg every 12 hours, decreased to 5 mg on 5/9  Will cont to monitor, patient stable in a symptomatically at this point. Due to questionable left ventricular hypertrophy, will continue low dose statin and beta blocker. 9. Hyperlipidemia. Lipitor 10. Spasticity: Controlled  Baclofen 5 mg on 4/27, DC'd on 5/10, restarted on 5/22. Will monitor for signs of somnolence. 59. Thrush:   Treated with Diflucan, [place] on 5/3 12. AKI  Creatinine 1.01 5/19, improving.   -continue to encourage fluids 13. Right ear itching  Debrox ordered  LOS (Days) 26 A FACE TO FACE EVALUATION WAS PERFORMED  Kiandra Sanguinetti [name] 05/04/2016 9:50 AM

## 2016-05-04 NOTE — Progress Notes (Signed)
Occupational Therapy Session Note  Patient Details  Name: Abigail Campos MRN: [id number] Date of Birth: March 28, 1971  Today's Date: 05/04/2016 OT Individual Time: 0930-1030 OT Individual Time Calculation (min): 60 min    Short Term Goals: Week 4:  OT Short Term Goal 1 (Week 4): STGs=LTGs secondary to upcoming discharge  Skilled Therapeutic Interventions/Progress Updates:    Pt in bed with a severe migrane. Pt stating she could not tolerate getting up. Pt stated that she was given medicine already for the headache. Pt agreeable to working on LUE PROM and estim while in bed. 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 Pt did need extra time to respond and answer questions.   PROM focusing on external rotation with attempts to engage scapular muscles, no active movement. Flexor response when pt yawns.   Estim for 15 min to finger flexors at 18 intensity with A/AROM to achieve full grasp. 15 min of estim to finger/wrist extensors at 26 intensity with a/arom to achieve full extension. 35pps with 10 sec on and 10 off. Pt tolerated well, but did not have any active response post. Pt adjusted in bed with pillow supporting arm.  All needs met.  Therapy Documentation Precautions:  Precautions Precautions: Fall Precaution Comments: left hemi, L inattention, impulsive Restrictions Weight Bearing Restrictions: No     Pain: Pain Assessment Pain Assessment: 0-10 Pain Score: 10-Worst pain ever Pain Type: Acute pain Pain Location: Head Pain Orientation: Right;Anterior;Posterior Pain Descriptors / Indicators: Headache Pain Frequency: Intermittent Pain Onset: Progressive Patients Stated Pain Goal: 1 Pain Intervention(s): Medication (See eMAR) ADL:   See Function Navigator for Current Functional Status.   Therapy/Group: Individual Therapy  [place] 05/04/2016, 12:22 PM

## 2016-05-04 NOTE — Plan of Care (Signed)
Problem: RH BOWEL ELIMINATION Goal: RH STG MANAGE BOWEL W/MEDICATION W/ASSISTANCE STG Manage Bowel with Medication with mod Assistance.  Outcome: Not Progressing Patient haven't had BM since 5/18

## 2016-05-04 NOTE — Progress Notes (Signed)
Speech Language Pathology Daily Session Note  Patient Details  Name: Abigail Campos MRN: [id number] Date of Birth: 1971-05-29  Today's Date: 05/04/2016 SLP Individual Time: 1100-1200 SLP Individual Time Calculation (min): 60 min  Short Term Goals: Week 4: SLP Short Term Goal 1 (Week 4): onsume current diet with minimal overt s/s of aspiration with suervision verbal cues for use of swallowing strategies.  SLP Short Term Goal 2 (Week 4): Patient will demonstrate selective attention to functional tasks for 30 minutes with Min A verbal cues for redirection in a mildly distracting enviornment.   SLP Short Term Goal 3 (Week 4): Patient will attend to left field of enviornment during functional tasks with supervision verbal cues.  SLP Short Term Goal 4 (Week 4): Patient will answer basic yes/no questions with 75% accuracy in regards to wants/needs with Min A multimodal cues.  SLP Short Term Goal 5 (Week 4): Patient will express wants/needs at the phrase level (3-4 word utterances) with Max A multimodal cues.  SLP Short Term Goal 6 (Week 4): Patient will self-monitor and correct phonemic errors at the word level in 75% of opportunities with Min A verbal cues.    Skilled Therapeutic Interventions: Skilled treatment session focused on cognitive-linguistic goals. SLP facilitated session by providing Min-Mod A visual and question cues for problem solving during a novel, mildly complex task, however, patient was able to follow complex procedures of task with supervision verbal cues. Patient verbalized functional information at the phrase level with Min A verbal cues and self-monitored and corrected phonemic errors with extra time and Min A verbal cues. Patient declined lunch due to headache with nausea. RN made aware and administered medications. Patient left supine in bed with all needs within reach. Continue with current plan of care.    Function:  Cognition Comprehension Comprehension assist level:  Understands basic 75 - 89% of the time/ requires cueing 10 - 24% of the time  Expression   Expression assist level: Expresses basic 50 - 74% of the time/requires cueing 25 - 49% of the time. Needs to repeat parts of sentences.  Social Interaction Social Interaction assist level: Interacts appropriately 75 - 89% of the time - Needs redirection for appropriate language or to initiate interaction.  Problem Solving Problem solving assist level: Solves basic 50 - 74% of the time/requires cueing 25 - 49% of the time  Memory Memory assist level: Recognizes or recalls 50 - 74% of the time/requires cueing 25 - 49% of the time    Pain 10/10: headache, RN made aware and administered medications   Therapy/Group: Individual Therapy  Adjoa Althouse 05/04/2016, 3:45 PM

## 2016-05-04 NOTE — Progress Notes (Signed)
Physical Therapy Session Note  Patient Details  Name: Abigail Campos MRN: [id number] Date of Birth: Nov 30, 1971  Today's Date: 05/04/2016 PT Individual Time: 1300-1410 PT Individual Time Calculation (min): 70 min   Short Term Goals: Week 4:  PT Short Term Goal 1 (Week 4): =LTGs due to ELOS  Skilled Therapeutic Interventions/Progress Updates:    Pt received sleeping in bed, awakes to voice, c/o 10/10 headache pain but reports already received medication and agreeable to therapy.  HA pain decreased to 5/10 by end of session.  Session focus on w/c mobility, LLE NMR, transfers, and family education.   Pt propelled w/c throughout unit during session, max distance 300' with rest breaks (~75 without stopping) and supervision with occasional verbal cues for attention to L side when navigating obstacles.    NMR in therapy gym with hemiwalker for stepping forwards and back with RLE until fatigue x3 trials.  Pt required max assist to maintain upright with task and to facilitate L knee control.    Family education with pt's husband, [name], for car transfers and furniture transfers.  [name] performed car transfer x2 at simulated sedan height with min/mod assist.  PT provided verbal cues only for pt to communicate more clearly with [name] throughout transfer regarding needs.  [name] performed furniture transfer with pt x2 with min assist for sit<>stand and pivot.  [name] demonstrates good control of pt's LLE throughout transfers and provides appropriate verbal cues to patient for stepping and weight shifting.    Pt returned to room at end of session and left upright in w/c with call bell in reach and needs met.    Therapy Documentation Precautions:  Precautions Precautions: Fall Precaution Comments: left hemi, L inattention, impulsive Restrictions Weight Bearing Restrictions: No   See Function Navigator for Current Functional Status.   Therapy/Group: Individual Therapy  [name] 05/04/2016,  2:15 PM

## 2016-05-05 ENCOUNTER — Inpatient Hospital Stay (HOSPITAL_COMMUNITY): Payer: Managed Care, Other (non HMO) | Admitting: Physical Therapy

## 2016-05-05 ENCOUNTER — Inpatient Hospital Stay (HOSPITAL_COMMUNITY): Payer: Managed Care, Other (non HMO) | Admitting: Occupational Therapy

## 2016-05-05 ENCOUNTER — Inpatient Hospital Stay (HOSPITAL_COMMUNITY): Payer: Managed Care, Other (non HMO) | Admitting: Speech Pathology

## 2016-05-05 NOTE — Progress Notes (Signed)
[place] PHYSICAL MEDICINE & REHABILITATION     PROGRESS NOTE  Subjective/Complaints:  Patient lying in bed this morning. She slept well overnight. She is awake and smiling.  ROS: full ROS limited due to language, but appears to deny CP, SOB, nausea, vomiting, diarrhea..  Objective: Vital Signs: Blood pressure 122/71, pulse 57, temperature 98.2 F (36.8 C), temperature source Oral, resp. rate 20, weight 66.86 kg (147 lb 6.4 oz), SpO2 100 %. No results found. No results for input(s): WBC, HGB, HCT, PLT in the last 72 hours. No results for input(s): NA, K, CL, GLUCOSE, BUN, CREATININE, CALCIUM in the last 72 hours.  Invalid input(s): CO CBG (last 3)  No results for input(s): GLUCAP in the last 72 hours.  Wt Readings from Last 3 Encounters:  04/29/16 66.86 kg (147 lb 6.4 oz)  04/06/16 73.6 kg (162 lb 4.1 oz)  01/22/16 74.9 kg (165 lb 2 oz)    Physical Exam:  BP 122/71 mmHg  Pulse 57  Temp(Src) 98.2 F (36.8 C) (Oral)  Resp 20  Wt 66.86 kg (147 lb 6.4 oz)  SpO2 100% Constitutional: She appears well-developed.  NAD HENT:  Normocephalic and atraumatic.  Eyes: Conjunctivae and EOM are normal.     Cardiovascular: Normal rate and regular rhythm.    Respiratory: Effort normal and breath sounds normal. No respiratory distress.   GI: Soft. Bowel sounds are normal. She exhibits no distension.  Musculoskeletal: She exhibits no edema or no tenderness.  Neurological: She is Alert and oriented 3 with increased time.  Inconsistently follows commands, Although Significantly improved  mainly expressive aphasia, , with word finding difficulty, with minimal receptive deficits.  Dysarthria Left facial weakness Motor: Moving right upper and right lower extremity freely.  Left upper/lower extremity: 0/5.  Modified [place] 1/4 left upper extremity..  Skin: Skin is warm and dry.  Psychiatric:  Smiling, cooperative  Assessment/Plan: 1. Functional deficits secondary to right basal  ganglia hemorrhage which require 3+ hours per day of interdisciplinary therapy in a comprehensive inpatient rehab setting. Physiatrist is providing close team supervision and 24 hour management of active medical problems listed below. Physiatrist and rehab team continue to assess barriers to discharge/monitor patient progress toward functional and medical goals.  Function:  Bathing Bathing position Bathing activity did not occur: Refused Position: Sitting EOB  Bathing parts Body parts bathed by patient: Chest, Abdomen, Left arm, Right upper leg, Left upper leg, Right lower leg, Front perineal area Body parts bathed by helper: Back, Left lower leg, Right arm, Buttocks  Bathing assist Assist Level:  (mod A)      Upper Body Dressing/Undressing Upper body dressing Upper body dressing/undressing activity did not occur: Refused What is the patient wearing?: Pull over shirt/dress, Bra Bra - Perfomed by patient: Thread/unthread right bra strap Bra - Perfomed by helper: Thread/unthread left bra strap, Hook/unhook bra (pull down sports bra) Pull over shirt/dress - Perfomed by patient: Thread/unthread right sleeve, Put head through opening, Pull shirt over trunk Pull over shirt/dress - Perfomed by helper: Thread/unthread left sleeve        Upper body assist Assist Level:  (mod A)      Lower Body Dressing/Undressing Lower body dressing Lower body dressing/undressing activity did not occur: Refused What is the patient wearing?: Pants, Non-skid slipper socks     Pants- Performed by patient: Thread/unthread right pants leg Pants- Performed by helper: Thread/unthread left pants leg, Pull pants up/down   Non-skid slipper socks- Performed by helper: Don/doff right sock, Don/doff left  sock               TED Hose - Performed by helper: Don/doff right TED hose, Don/doff left TED hose  Lower body assist Assist for lower body dressing:  (total a)      Toileting Toileting Toileting activity  did not occur: No continent bowel/bladder event Toileting steps completed by patient: Performs perineal hygiene Toileting steps completed by helper: Adjust clothing prior to toileting, Adjust clothing after toileting Toileting Assistive Devices: Grab bar or rail  Toileting assist Assist level:  (mod a)   Transfers Chair/bed transfer   Chair/bed transfer method: Stand pivot Chair/bed transfer assist level: Touching or steadying assistance (Pt > 75%) Chair/bed transfer assistive device: Armrests     Locomotion Ambulation Ambulation activity did not occur: Safety/medical concerns   Max distance: 50 Assist level: Moderate assist (Pt 50 - 74%)   Wheelchair   Type: Manual Max wheelchair distance: 200 Assist Level: Supervision or verbal cues  Cognition Comprehension Comprehension assist level: Understands basic 75 - 89% of the time/ requires cueing 10 - 24% of the time  Expression Expression assist level: Expresses basic 50 - 74% of the time/requires cueing 25 - 49% of the time. Needs to repeat parts of sentences.  Social Interaction Social Interaction assist level: Interacts appropriately 75 - 89% of the time - Needs redirection for appropriate language or to initiate interaction.  Problem Solving Problem solving assist level: Solves basic 50 - 74% of the time/requires cueing 25 - 49% of the time  Memory Memory assist level: Recognizes or recalls 50 - 74% of the time/requires cueing 25 - 49% of the time     Medical Problem List and Plan: 1.Left hemiplegia, aphasia, dysphagia secondary to right basal ganglia hemorrhage secondary to hypertensive crisis on 04/01/16.   Continue CIR therapies  PRAFO for left lower extremity and splints for left upper extremity ordered  Fluoxetine 10 started on 5/1, Increased to 20 on 5/8 2. DVT Prophylaxis/Anticoagulation: Subcutaneous heparin for DVT prophylaxis  3. Pain Management/migraine headaches: Fioricet for headaches and Robaxin. 4. Dysphagia. #2  thin liquid diet, Advanced to dysphagia 3 with thin liquids. Speech therapy follow-up. Will continue to advance diet as tolerated  Remeron changed to Marinol on 5/12, increased on 5/16.   Eating 0-100 % of meals. 5. Neuropsych: This patient is not capable of making decisions on her own behalf. 6. Skin/Wound Care: Routine skin checks 7. Fluids/Electrolytes/Nutrition: Routine I&O   BMP within acceptable range on 5/19 8. Hypertension now with hypotension,   Plendil 10 mg daily DC'd on 5/15  Hydralazine 10 mg 3 times a day, DC'd on 5/10   Lisinopril 20 mg twice a day, increased to 40 mg on 4/28, changed to 40 mg daily on 5/11, decreased to 20 mg daily on 5/12, decreased to 5 mg on 5/15, decreased to 2.5 on [id number]  Bysystolic 20 mg every 12 hours, decreased to 5 mg on 5/9  Will cont to monitor, patient stable in a symptomatically at this point. Due to questionable left ventricular hypertrophy, will continue low dose statin and beta blocker. 9. Hyperlipidemia. Lipitor 10. Spasticity: Controlled  Baclofen 5 mg on 4/27, DC'd on 5/10, restarted on 5/22. Will monitor for signs of somnolence. 80. Thrush:   Treated with Diflucan, [place] on 5/3 12. AKI  Creatinine 1.01 5/19, improving.   -continue to encourage fluids 13. Right ear itching  Debrox ordered  LOS (Days) 27 A FACE TO FACE EVALUATION WAS PERFORMED  Gabriel Paulding [name]  05/05/2016 8:54 AM

## 2016-05-05 NOTE — Progress Notes (Signed)
Physical Therapy Session Note  Patient Details  Name: Abigail Campos MRN: [id number] Date of Birth: May 03, 1971  Today's Date: 05/05/2016 PT Individual Time: 1300-1415 PT Individual Time Calculation (min): 75 min   Short Term Goals: Week 4:  PT Short Term Goal 1 (Week 4): =LTGs due to ELOS  Skilled Therapeutic Interventions/Progress Updates:    Pt received resting in w/c with no c/o pain and agreeable to therapy session.  Session focus on LLE NMR, gait with hemiwalker, w/c propulsion, transfers, and dynamic sitting balance.  NMR via forced use/weight bearing: rapid forward/backward stepping with RLE at rail in hallway with mod/max assist for upright posture and max verbal cues for posture and L quad contraction.  Gait training x32' +15' +40' with RW and 1-2 turns each time with mod assist for straight path gait and max assist with multimodal cues for turning.  NMR for LLE quad contraction using DTR to facilitate kicking a ball 3 trials until fatigued.    Dynamic sitting balance with no LE support playing Wii [employer].  Pt transfers sit<>stand throughout session with hemiwalker and steady assist and squat/pivot from w/c<>therapy mat to R side with steady assist.   Pt returned to room at end of session and positioned upright with call bell in reach and needs met.   Therapy Documentation Precautions:  Precautions Precautions: Fall Precaution Comments: left hemi, L inattention, impulsive Restrictions Weight Bearing Restrictions: No   See Function Navigator for Current Functional Status.   Therapy/Group: Individual Therapy  Abigail Campos 05/05/2016, 2:21 PM

## 2016-05-05 NOTE — Progress Notes (Signed)
Occupational Therapy Session Note  Patient Details  Name: Abigail Campos MRN: [id number] Date of Birth: [birth year]  Today's Date: 05/05/2016 OT Individual Time: 1000-1100 OT Individual Time Calculation (min): 60 min    Short Term Goals: Week 4:  OT Short Term Goal 1 (Week 4): STGs=LTGs secondary to upcoming discharge  Skilled Therapeutic Interventions/Progress Updates:    Upon entering the room, pt supine in bed sleeping with no c/o pain . Pt agreeable to OT intervention. No family present for training/education. Pt declined bathing this session. Pt performed supine >sit with supervision and min verbal cues. Pt transferred to wheelchair with min A towards the R side. OT propelled wheelchair into bathroom as pt states urgency for toileting. Pt required max A for stand pivot transfer onto standard toilet. Pt able to pull clothing down and performed hygiene while standing with total A for standing balance to complete task.  LB clothing changed while seated on commode this session. UB self care performed from wheelchair level at sink side with mod A and mod verbal cues for hemiplegic dressing techniques. Pt propelled wheelchair to day room with hemiplegic technique with min verbal cues and increased time. Pt and OT engaged in functional communication as pt researched menu online for favorite restaurant. Pt verbalizing names of food dishes on menu that she enjoys with mod verbal cues to pronounce again in order to understand verbal language best. Pt remained in wheelchair and transitioned to SLP session without difficulty.   Therapy Documentation Precautions:  Precautions Precautions: Fall Precaution Comments: left hemi, L inattention, impulsive Restrictions Weight Bearing Restrictions: No  See Function Navigator for Current Functional Status.   Therapy/Group: Individual Therapy  Phineas Semen 05/05/2016, 12:37 PM

## 2016-05-05 NOTE — Progress Notes (Signed)
Speech Language Pathology Daily Session Note  Patient Details  Name: Abigail Campos MRN: [id number] Date of Birth: 03-06-1971  Today's Date: 05/05/2016 SLP Individual Time: 1100-1200 SLP Individual Time Calculation (min): 60 min  Short Term Goals: Week 4: SLP Short Term Goal 1 (Week 4): onsume current diet with minimal overt s/s of aspiration with suervision verbal cues for use of swallowing strategies.  SLP Short Term Goal 2 (Week 4): Patient will demonstrate selective attention to functional tasks for 30 minutes with Min A verbal cues for redirection in a mildly distracting enviornment.   SLP Short Term Goal 3 (Week 4): Patient will attend to left field of enviornment during functional tasks with supervision verbal cues.  SLP Short Term Goal 4 (Week 4): Patient will answer basic yes/no questions with 75% accuracy in regards to wants/needs with Min A multimodal cues.  SLP Short Term Goal 5 (Week 4): Patient will express wants/needs at the phrase level (3-4 word utterances) with Max A multimodal cues.  SLP Short Term Goal 6 (Week 4): Patient will self-monitor and correct phonemic errors at the word level in 75% of opportunities with Min A verbal cues.  SLP Short Term Goal 6 - Progress (Week 4): Met  Skilled Therapeutic Interventions: Pt seen for speech language therapy with focus on generating sentence level production. Pt required min- mod A for structure words and initiation of a sentence. Pt able to communicate complex information with mod A of communication partner for clarifying questions and encouragement to provice further detail.   Function:  Eating Eating                 Cognition Comprehension Comprehension assist level: Understands basic 90% of the time/cues < 10% of the time  Expression   Expression assist level: Expresses basic 50 - 74% of the time/requires cueing 25 - 49% of the time. Needs to repeat parts of sentences.  Social Interaction Social Interaction  assist level: Interacts appropriately 90% of the time - Needs monitoring or encouragement for participation or interaction.  Problem Solving Problem solving assist level: Solves basic 50 - 74% of the time/requires cueing 25 - 49% of the time  Memory Memory assist level: Recognizes or recalls 50 - 74% of the time/requires cueing 25 - 49% of the time    Pain Pain Assessment Pain Assessment: No/denies pain  Therapy/Group: Individual Therapy  Vinetta Bergamo MA, CCC-SLP 05/05/2016, 3:40 PM

## 2016-05-06 ENCOUNTER — Inpatient Hospital Stay (HOSPITAL_COMMUNITY): Payer: Managed Care, Other (non HMO) | Admitting: Speech Pathology

## 2016-05-06 ENCOUNTER — Inpatient Hospital Stay (HOSPITAL_COMMUNITY): Payer: Managed Care, Other (non HMO) | Admitting: Occupational Therapy

## 2016-05-06 ENCOUNTER — Inpatient Hospital Stay (HOSPITAL_COMMUNITY): Payer: Managed Care, Other (non HMO) | Admitting: Physical Therapy

## 2016-05-06 NOTE — Progress Notes (Signed)
Occupational Therapy Session Note  Patient Details  Name: Abigail Campos MRN: [id number] Date of Birth: 1971/02/22  Today's Date: 05/06/2016 OT Individual Time: 1000-1100 OT Individual Time Calculation (min): 60 min    Short Term Goals: Week 4:  OT Short Term Goal 1 (Week 4): STGs=LTGs secondary to upcoming discharge  Skilled Therapeutic Interventions/Progress Updates:    Upon entering the room, pt supine in bed awaiting therapist. Pt with no c/o,signs, or symptoms of pain this session. Pt looking at therapist and stating, "I need go pee." Pt performed supine >sit with steady assist without use of bed rail. Pt performed stand pivot transfer from bed >wheelchair with min A . OT propelled pt into bathroom via wheelchair. Pt performed stand pivot transfer with use of grab bar onto standard commode with mod A. Pt able to perform clothing management initially and hygiene with proper assist for balance. Pt then returned to wheelchair and transferred from wheelchair onto TTB with max A and use of grab bar. Pt bathing with mod A and supervision - steady assist for dynamic sitting balance as feet not touching the ground. Pt dressed from wheelchair with mod cues for hemiplegic dressing technique. Hand over hand assist to utilize L UE in functional tasks. Pt remained in wheelchair at end of session with call bell and all needed items within reach upon exiting the room.   Therapy Documentation Precautions:  Precautions Precautions: Fall Precaution Comments: left hemi, L inattention, impulsive Restrictions Weight Bearing Restrictions: No   Pain: Pain Assessment Pain Assessment: No/denies pain  See Function Navigator for Current Functional Status.   Therapy/Group: Individual Therapy  Phineas Semen 05/06/2016, 12:39 PM

## 2016-05-06 NOTE — Progress Notes (Signed)
Speech Language Pathology Daily Session Note  Patient Details  Name: Abigail Campos MRN: [id number] Date of Birth: 1971/08/31  Today's Date: 05/06/2016 SLP Individual Time: 1330-1430 SLP Individual Time Calculation (min): 60 min  Short Term Goals: Week 4: SLP Short Term Goal 1 (Week 4): onsume current diet with minimal overt s/s of aspiration with suervision verbal cues for use of swallowing strategies.  SLP Short Term Goal 2 (Week 4): Patient will demonstrate selective attention to functional tasks for 30 minutes with Min A verbal cues for redirection in a mildly distracting enviornment.   SLP Short Term Goal 3 (Week 4): Patient will attend to left field of enviornment during functional tasks with supervision verbal cues.  SLP Short Term Goal 4 (Week 4): Patient will answer basic yes/no questions with 75% accuracy in regards to wants/needs with Min A multimodal cues.  SLP Short Term Goal 5 (Week 4): Patient will express wants/needs at the phrase level (3-4 word utterances) with Max A multimodal cues.  SLP Short Term Goal 6 (Week 4): Patient will self-monitor and correct phonemic errors at the word level in 75% of opportunities with Min A verbal cues.  SLP Short Term Goal 6 - Progress (Week 4): Met  Skilled Therapeutic Interventions: Skilled treatment session focused on cognitive-linguistic goals. SLP facilitated session by providing Min A verbal cues for patient to communicate at the phrase and sentence level during a verbal description task and Mod A multimodal cues to self-monitor and correct phonemic errors throughout task. Patient's husband present and educated in regards to patient's language impairments and strategies to utilize to maximize verbal expression. He provided appropriate cueing throughout the session. Continue with current plan of care.    Function:   Cognition Comprehension Comprehension assist level: Follows basic conversation/direction with extra time/assistive  device  Expression   Expression assist level: Expresses basic 50 - 74% of the time/requires cueing 25 - 49% of the time. Needs to repeat parts of sentences.  Social Interaction Social Interaction assist level: Interacts appropriately with others with medication or extra time (anti-anxiety, antidepressant).  Problem Solving Problem solving assist level: Solves basic 75 - 89% of the time/requires cueing 10 - 24% of the time  Memory Memory assist level: Recognizes or recalls 75 - 89% of the time/requires cueing 10 - 24% of the time    Pain No/Denies Pain   Therapy/Group: Individual Therapy  [name] 05/06/2016, 4:15 PM

## 2016-05-06 NOTE — Progress Notes (Signed)
[place] PHYSICAL MEDICINE & REHABILITATION     PROGRESS NOTE  Subjective/Complaints:  Patient lying in bed this morning. She slept well overnight. Her aphasia continues to improve as she is able to answer orientation questions.   ROS: full ROS limited due to language, but appears to deny CP, SOB, nausea, vomiting, diarrhea..  Objective: Vital Signs: Blood pressure 100/68, pulse 57, temperature 98.2 F (36.8 C), temperature source Oral, resp. rate 20, weight 66.86 kg (147 lb 6.4 oz), SpO2 99 %. No results found. No results for input(s): WBC, HGB, HCT, PLT in the last 72 hours. No results for input(s): NA, K, CL, GLUCOSE, BUN, CREATININE, CALCIUM in the last 72 hours.  Invalid input(s): CO CBG (last 3)  No results for input(s): GLUCAP in the last 72 hours.  Wt Readings from Last 3 Encounters:  04/29/16 66.86 kg (147 lb 6.4 oz)  04/06/16 73.6 kg (162 lb 4.1 oz)  01/22/16 74.9 kg (165 lb 2 oz)    Physical Exam:  BP 100/68 mmHg  Pulse 57  Temp(Src) 98.2 F (36.8 C) (Oral)  Resp 20  Wt 66.86 kg (147 lb 6.4 oz)  SpO2 99% Constitutional: She appears well-developed.  NAD HENT:  Normocephalic and atraumatic.  Eyes: Conjunctivae and EOM are normal.     Cardiovascular: Normal rate and regular rhythm.    Respiratory: Effort normal and breath sounds normal. No respiratory distress.   GI: Soft. Bowel sounds are normal. She exhibits no distension.  Musculoskeletal: She exhibits no edema or no tenderness.  Neurological: She is Alert and oriented 3 with increased time.  Inconsistently follows commands, Although Significantly improved  mainly expressive aphasia, , with word finding difficulty, with minimal receptive deficits.  Dysarthria Left facial weakness Motor: Moving right upper and right lower extremity freely.  Left upper/lower extremity: 0/5.  Modified [place] 1/4 left elbow flexion.  Skin: Skin is warm and dry.  Psychiatric:  Smiling, cooperative  Assessment/Plan: 1.  Functional deficits secondary to right basal ganglia hemorrhage which require 3+ hours per day of interdisciplinary therapy in a comprehensive inpatient rehab setting. Physiatrist is providing close team supervision and 24 hour management of active medical problems listed below. Physiatrist and rehab team continue to assess barriers to discharge/monitor patient progress toward functional and medical goals.  Function:  Bathing Bathing position Bathing activity did not occur: Refused Position: Sitting EOB  Bathing parts Body parts bathed by patient: Chest, Abdomen, Left arm, Right upper leg, Left upper leg, Right lower leg, Front perineal area Body parts bathed by helper: Back, Left lower leg, Right arm, Buttocks  Bathing assist Assist Level:  (mod A)      Upper Body Dressing/Undressing Upper body dressing Upper body dressing/undressing activity did not occur: Refused What is the patient wearing?: Pull over shirt/dress, Bra Bra - Perfomed by patient: Thread/unthread right bra strap Bra - Perfomed by helper: Thread/unthread left bra strap, Hook/unhook bra (pull down sports bra) Pull over shirt/dress - Perfomed by patient: Thread/unthread right sleeve, Put head through opening, Pull shirt over trunk Pull over shirt/dress - Perfomed by helper: Thread/unthread left sleeve        Upper body assist Assist Level:  (mod A)      Lower Body Dressing/Undressing Lower body dressing Lower body dressing/undressing activity did not occur: Refused What is the patient wearing?: Pants, Non-skid slipper socks     Pants- Performed by patient: Thread/unthread right pants leg Pants- Performed by helper: Thread/unthread left pants leg, Pull pants up/down   Non-skid slipper  socks- Performed by helper: Don/doff right sock, Don/doff left sock               TED Hose - Performed by helper: Don/doff right TED hose, Don/doff left TED hose  Lower body assist Assist for lower body dressing:  (total A)       Toileting Toileting Toileting activity did not occur: No continent bowel/bladder event Toileting steps completed by patient: Adjust clothing prior to toileting Toileting steps completed by helper: Adjust clothing prior to toileting, Adjust clothing after toileting Toileting Assistive Devices: Grab bar or rail  Toileting assist Assist level:  (mod A)   Transfers Chair/bed transfer   Chair/bed transfer method: Stand pivot Chair/bed transfer assist level: Touching or steadying assistance (Pt > 75%) Chair/bed transfer assistive device: Armrests     Locomotion Ambulation Ambulation activity did not occur: Safety/medical concerns   Max distance: 32+15+40 Assist level: Moderate assist (Pt 50 - 74%)   Wheelchair   Type: Manual Max wheelchair distance: 200 Assist Level: No help, No cues, assistive device, takes more than reasonable amount of time  Cognition Comprehension Comprehension assist level: Understands basic 75 - 89% of the time/ requires cueing 10 - 24% of the time  Expression Expression assist level: Expresses basic 50 - 74% of the time/requires cueing 25 - 49% of the time. Needs to repeat parts of sentences.  Social Interaction Social Interaction assist level: Interacts appropriately 75 - 89% of the time - Needs redirection for appropriate language or to initiate interaction.  Problem Solving Problem solving assist level: Solves basic 50 - 74% of the time/requires cueing 25 - 49% of the time  Memory Memory assist level: Recognizes or recalls 50 - 74% of the time/requires cueing 25 - 49% of the time     Medical Problem List and Plan: 1.Left hemiplegia, aphasia, dysphagia secondary to right basal ganglia hemorrhage secondary to hypertensive crisis on 04/01/16.   Continue CIR therapies  PRAFO for left lower extremity and splints for left upper extremity ordered  Fluoxetine 10 started on 5/1, Increased to 20 on 5/8 2. DVT Prophylaxis/Anticoagulation: Subcutaneous heparin for  DVT prophylaxis  3. Pain Management/migraine headaches: Fioricet for headaches and Robaxin. 4. Dysphagia. #2 thin liquid diet, Advanced to dysphagia 3 with thin liquids. Speech therapy follow-up. Will continue to advance diet as tolerated  Remeron changed to Marinol on 5/12, increased on 5/16.   Intake improving. 5. Neuropsych: This patient is not capable of making decisions on her own behalf. 6. Skin/Wound Care: Routine skin checks 7. Fluids/Electrolytes/Nutrition: Routine I&O   BMP within acceptable range on 5/19 8. Hypertension now with hypotension,   Plendil 10 mg daily DC'd on 5/15  Hydralazine 10 mg 3 times a day, DC'd on 5/10   Lisinopril 20 mg twice a day, increased to 40 mg on 4/28, changed to 40 mg daily on 5/11, decreased to 20 mg daily on 5/12, decreased to 5 mg on 5/15, decreased to 2.5 on [id number]  Bysystolic 20 mg every 12 hours, decreased to 5 mg on 5/9  Will cont to monitor, patient stable in a symptomatically at this point. Due to questionable left ventricular hypertrophy, will continue low dose statin and beta blocker. 9. Hyperlipidemia. Lipitor 10. Spasticity: Controlled  Baclofen 5 mg on 4/27, DC'd on 5/10, restarted on 5/22. Will monitor for signs of somnolence. 72. Thrush:   Treated with Diflucan, [place] on 5/3 12. AKI  Creatinine 1.01 5/19, improving.   -continue to encourage fluids 13. Right ear itching: Resolved  Treated with Debrox  LOS (Days) 28 A FACE TO FACE EVALUATION WAS PERFORMED  Abigail Campos 05/06/2016 7:59 AM

## 2016-05-06 NOTE — Progress Notes (Signed)
Physical Therapy Session Note  Patient Details  Name: Abigail Campos MRN: [id number] Date of Birth: 09/04/1971  Today's Date: 05/06/2016 PT Individual Time: 1103-1201 PT Individual Time Calculation (min): 58 min   Short Term Goals: Week 4:  PT Short Term Goal 1 (Week 4): =LTGs due to ELOS  Skilled Therapeutic Interventions/Progress Updates:    Pt received resting in w/c, no c/o pain, and agreeable to therapy session.  Session focus on forced used LLE NMR, gait training with hemiwalker, transfers, dynamic sitting, and dynamic standing balance.    Pt propels w/c with R hemi technique mod I to and from therapy gym.  Pt transfers throughout session squat/pivot and sit<>stand with steady assist.  Pt requires mod assist for stand/pivot transfers to L.    NMR via forced use for stepping RLE forwards/backwards to fatigue using hemiwalker with therapist supporting from pt's L side.  Pt c/o dizziness, returned to sitting and BP 127/76.   Gait training x25' +30' with hemiwalker and 2 turns to sit and rest on therapy mat between trials.  Mod assist for upright posture and to progress LLE, mod multimodal cues for sequencing.  One instance of genu recurvatum in L stance.    Dynamic balance with Wii Just Dance, 1 trial seated with BLE support, no UE support, and supervision; 1 trial with no LE support, no UE support, and supervision; 1 trial in standing with mod assist to maintain upright posture.  Pt noted to have some quad/add/abd activation for L knee control while dancing, though knee remained flexed in stance without facilitation for extension from PT.    Pt returned to room in w/c at end of session and positioned upright with call bell in reach and needs met.   Therapy Documentation Precautions:  Precautions Precautions: Fall Precaution Comments: left hemi, L inattention, impulsive Restrictions Weight Bearing Restrictions: No   See Function Navigator for Current Functional  Status.   Therapy/Group: Individual Therapy   E Penven-Crew 05/06/2016, 12:22 PM  

## 2016-05-06 NOTE — Progress Notes (Signed)
Speech Language Pathology Daily Session Note  Patient Details  Name: Abigail Campos MRN: [id number] Date of Birth: Apr 18, 1971  Today's Date: 05/06/2016 SLP Individual Time: 0901-0930 SLP Individual Time Calculation (min): 29 min  Short Term Goals: Week 4: SLP Short Term Goal 1 (Week 4): onsume current diet with minimal overt s/s of aspiration with suervision verbal cues for use of swallowing strategies.  SLP Short Term Goal 2 (Week 4): Patient will demonstrate selective attention to functional tasks for 30 minutes with Min A verbal cues for redirection in a mildly distracting enviornment.   SLP Short Term Goal 3 (Week 4): Patient will attend to left field of enviornment during functional tasks with supervision verbal cues.  SLP Short Term Goal 4 (Week 4): Patient will answer basic yes/no questions with 75% accuracy in regards to wants/needs with Min A multimodal cues.  SLP Short Term Goal 5 (Week 4): Patient will express wants/needs at the phrase level (3-4 word utterances) with Max A multimodal cues.  SLP Short Term Goal 6 (Week 4): Patient will self-monitor and correct phonemic errors at the word level in 75% of opportunities with Min A verbal cues.  SLP Short Term Goal 6 - Progress (Week 4): Met  Skilled Therapeutic Interventions: Pt seen for speech language therapy with focus on generating sentence level production. Pt required min- mod A for structure words and clarifying questions to encourage further detail. Pt able to consistently make an effort to form sentence level communication at mod I (without verbal cueing).    Function:  Eating Eating                 Cognition Comprehension Comprehension assist level: Follows basic conversation/direction with extra time/assistive device  Expression   Expression assist level: Expresses basic 50 - 74% of the time/requires cueing 25 - 49% of the time. Needs to repeat parts of sentences.  Social Interaction Social Interaction  assist level: Interacts appropriately with others with medication or extra time (anti-anxiety, antidepressant).  Problem Solving Problem solving assist level: Solves basic 75 - 89% of the time/requires cueing 10 - 24% of the time  Memory Memory assist level: Recognizes or recalls 75 - 89% of the time/requires cueing 10 - 24% of the time    Pain Pain Assessment Pain Assessment: No/denies pain  Therapy/Group: Individual Therapy  Vinetta Bergamo MA, CCC-SLP 05/06/2016, 12:17 PM

## 2016-05-07 ENCOUNTER — Inpatient Hospital Stay (HOSPITAL_COMMUNITY): Payer: Managed Care, Other (non HMO) | Admitting: Speech Pathology

## 2016-05-07 ENCOUNTER — Inpatient Hospital Stay (HOSPITAL_COMMUNITY): Payer: Managed Care, Other (non HMO) | Admitting: Physical Therapy

## 2016-05-07 ENCOUNTER — Inpatient Hospital Stay (HOSPITAL_COMMUNITY): Payer: Managed Care, Other (non HMO) | Admitting: Occupational Therapy

## 2016-05-07 NOTE — Progress Notes (Signed)
Occupational Therapy Discharge Summary  Patient Details  Name: Abigail Campos MRN: [id number] Date of Birth: 27-Nov-1971  Today's Date: 05/08/2016 -       Patient has met 74 of  f17 long term goals due to improved activity tolerance, improved balance, postural control, ability to compensate for deficits, functional use of  LEFT upper extremity, improved attention and improved awareness.  Patient to discharge at overall Mod Assist level.  Patient's care partner is independent to provide the necessary physical and cognitive assistance at discharge.    Reasons goals not met: all goals met  Recommendation:  Patient will benefit from ongoing skilled OT services in home health setting to continue to advance functional skills in the area of BADL.  Equipment: drop arm commode chair  Reasons for discharge: treatment goals met and discharge from hospital  Patient/family agrees with progress made and goals achieved: Yes  OT Discharge Precautions/Restrictions  Precautions Precautions: Fall Precaution Comments: dense L hemi, impulsive Restrictions Weight Bearing Restrictions: No Vital Signs Therapy Vitals Temp: 98.4 F (36.9 C) Temp Source: Oral Pulse Rate: 61 Resp: 18 BP: 108/65 mmHg Patient Position (if appropriate): Sitting Oxygen Therapy SpO2: 97 % O2 Device: Not Delivered Pain Pain Assessment Pain Assessment: No/denies pain Vision/Perception  Vision- History Patient Visual Report: Blurring of vision Vision- Assessment Vision Assessment?: No apparent visual deficits  Cognition Overall Cognitive Status: Impaired/Different from baseline Arousal/Alertness: Awake/alert Orientation Level: Oriented X4 Sensation Sensation Light Touch: Impaired by gross assessment Stereognosis: Not tested Hot/Cold: Impaired by gross assessment Proprioception: Impaired by gross assessment Motor  Motor Motor: Hemiplegia Mobility  Bed Mobility Bed Mobility: Rolling Right;Rolling  Left;Supine to Sit;Sit to Supine Rolling Right: 5: Supervision Rolling Left: 5: Supervision Supine to Sit: 5: Supervision Sit to Supine: 5: Supervision Transfers Transfers: Sit to Stand;Stand to Sit Sit to Stand: 4: Min guard Stand to Sit: 4: Min guard  Trunk/Postural Assessment  Cervical Assessment Cervical Assessment: Within Functional Limits Thoracic Assessment Thoracic Assessment: Within Functional Limits Lumbar Assessment Lumbar Assessment: Exceptions to [facility] (posterior pelvic tilt) Postural Control Postural Control: Deficits on evaluation  Balance Dynamic Sitting Balance Sitting balance - Comments: supervision  while sitting on EOB for bathing and dressing task Extremity/Trunk Assessment RUE Assessment RUE Assessment: Within Functional Limits LUE Assessment LUE Assessment: Exceptions to [facility] LUE Strength LUE Overall Strength: Deficits LUE Overall Strength Comments: 0/5 throughout LUE Tone LUE Tone: Flaccid   See Function Navigator for Current Functional Status.  Phineas Semen 05/07/2016, 4:01 PM

## 2016-05-07 NOTE — Progress Notes (Signed)
Physical Therapy Discharge Summary  Patient Details  Name: Abigail Campos MRN: [id number] Date of Birth: [birth year]  Today's Date: 05/08/2016  Patient has met 9 of 9 long term goals due to improved activity tolerance, improved balance, improved postural control, ability to compensate for deficits, improved attention, improved awareness and improved coordination.  Patient to discharge at a wheelchair level Min Assist.   Patient's care partner is independent to provide the necessary physical and cognitive assistance at discharge.  Pt's husband and mother have attended numerous physical therapy sessions and are able to perform w/c<>bed, w/c<>furniture, and car transfers safely.  Pt able to communicate needs during transfers with min verbal cues.    Recommendation:  Patient will benefit from ongoing skilled PT services in home health setting to continue to advance safe functional mobility, address ongoing impairments in muscle activation, balance, awareness, postural control, and functional use of LUE/LLE, and minimize fall risk.  Equipment: 18x18 ultra hemi-height w/c with standard leg rests and basic cushion  Reasons for discharge: treatment goals met  Patient/family agrees with progress made and goals achieved: Yes    PT Discharge Precautions/Restrictions Precautions Precautions: Fall Precaution Comments: dense L hemi, impulsive Restrictions Weight Bearing Restrictions: No  Cognition Overall Cognitive Status: Impaired/Different from baseline Arousal/Alertness: Awake/alert Orientation Level: Oriented X4 Focused Attention: Appears intact Sustained Attention: Appears intact Awareness: Impaired Awareness Impairment: Anticipatory impairment;Emergent impairment (emergent is improving, but continues to show some deficits) Problem Solving: Impaired Behaviors: Impulsive Safety/Judgment: Impaired Comments: overall safety awareness has improved but pt continues to demonstrate deficits  with emergent, anticipatory, and safety awareness Sensation Sensation Light Touch: Appears Intact Stereognosis: Not tested Hot/Cold: Impaired by gross assessment Proprioception: Impaired by gross assessment Coordination Gross Motor Movements are Fluid and Coordinated: No Fine Motor Movements are Fluid and Coordinated: No Coordination and Movement Description: LUE/LLE hemiplegia Motor  Motor Motor: Other (comment) Motor - Discharge Observations: ongoing L hemiparesis though pt noted to be able to activate muscles in LLE when weight bearing and some movement noted in gravity reduced positions  Mobility Bed Mobility Bed Mobility: Sit to Supine;Supine to Sit Rolling Right: 5: Supervision Rolling Left: 5: Supervision Supine to Sit: 4: Min assist (without rails) Sit to Supine: 4: Min assist (for LLE onto mat in gym) Sit to Supine: Patient Percentage: 90% Sit to Supine - Details (indicate cue type and reason): min assist for LLE onto mat sit>supine and min assist for pt to pull up on therapist's hand for supine>sit on flat therapy mat Transfers Transfers: Yes Sit to Stand: 4: Min assist Sit to Stand Details: Verbal cues for technique;Visual cues for safe use of DME/AE Stand to Sit: 4: Min assist Stand to Sit Details (indicate cue type and reason): Visual cues for safe use of DME/AE Squat Pivot Transfers: 4: Min assist Locomotion  Ambulation Ambulation: Yes Ambulation/Gait Assistance: 3: Mod assist Ambulation Distance (Feet): 53 Feet Assistive device: Hemi-walker Ambulation/Gait Assistance Details: Visual cues for safe use of DME/AE;Verbal cues for sequencing;Verbal cues for gait pattern;Verbal cues for technique;Verbal cues for safe use of DME/AE;Other (comment) Ambulation/Gait Assistance Details: manual facilitation for upright posture Gait Gait: Yes Gait Pattern: Impaired Gait Pattern: Step-to pattern;Decreased stance time - left;Decreased weight shift to left;Left genu  recurvatum;Left flexed knee in stance Knee - Stance Phase - Impaired Gait Pattern: Extensor thrust - Left Stairs / Additional Locomotion Stairs: Yes Stairs Assistance: 2: Max assist Stair Management Technique: One rail Right;Other (comment) (therapist under LUE) Number of Stairs: 1 Height of Stairs: 3 Wheelchair Mobility  Wheelchair Mobility: Yes Wheelchair Assistance: 6: Modified independent (Device/Increase time) Environmental health practitioner: Right upper extremity;Right lower extremity Wheelchair Parts Management: Needs assistance Distance: 200  Trunk/Postural Assessment  Cervical Assessment Cervical Assessment: Within Functional Limits Thoracic Assessment Thoracic Assessment: Within Functional Limits Lumbar Assessment Lumbar Assessment:  (resting posterior pelvic tilt, corrects with verbal cues) Postural Control Postural Control: Deficits on evaluation Protective Responses: delayed protective response on R, absent on L 2/2 hemiplegia  Balance Balance Balance Assessed: Yes Static Sitting Balance Static Sitting - Balance Support: Right upper extremity supported;Feet supported Static Sitting - Level of Assistance: 5: Stand by assistance Dynamic Sitting Balance Dynamic Sitting - Balance Support: No upper extremity supported;Feet supported;Feet unsupported Dynamic Sitting - Level of Assistance: 5: Stand by assistance Sitting balance - Comments: supervision sitting edge of therapy mat with feet supported and unsupported playing Wii Static Standing Balance Static Standing - Balance Support: Right upper extremity supported;Left upper extremity supported (LUE around therapist's shoulder, R supported on hemiwalker or stair railing) Static Standing - Level of Assistance: 3: Mod assist;4: Min assist Dynamic Standing Balance Dynamic Standing - Balance Support: Right upper extremity supported;Left upper extremity supported (LUE supported around therapist's shoulders) Dynamic Standing - Level of  Assistance: 3: Mod assist Extremity Assessment  RUE Assessment RUE Assessment: Within Functional Limits LUE Assessment LUE Assessment: Exceptions to [facility] LUE Strength LUE Overall Strength: Deficits LUE Overall Strength Comments: 0/5 throughout LUE Tone LUE Tone: Flaccid RLE Assessment RLE Assessment: Within Functional Limits LLE Assessment LLE Assessment: Exceptions to [facility] LLE Strength LLE Overall Strength: Deficits Left Hip Flexion: 2-/5 Left Knee Flexion: 1/5 Left Knee Extension: 2-/5 LLE Tone LLE Tone: Hypotonic   See Function Navigator for Current Functional Status.  Caitlin E Penven-Crew 05/07/2016, 4:37 PM

## 2016-05-07 NOTE — Discharge Summary (Signed)
NAMEMarland Kitchen  DAMION, BURGOON NO.:  [id number]  MEDICAL RECORD NO.:  [id number]  LOCATION:  4W05C                        FACILITY:  [facility]  PHYSICIAN:  Delice Lesch, MD        DATE OF BIRTH:  01/30/1971  DATE OF ADMISSION:  04/07/2016 DATE OF DISCHARGE:  05/08/2016                              DISCHARGE SUMMARY   DISCHARGE DIAGNOSES: 1. Right basal ganglia hemorrhage secondary to hypertensive crisis. 2. Subcutaneous heparin for DVT prophylaxis. 3. Pain management. 4. Dysphagia. 5. Depression. 6. Hypertension. 7. Hyperlipidemia. 8. Acute kidney injury. 9. Muscle spasticity  HISTORY OF PRESENT ILLNESS:  This is a 45 year old right-handed female with history of hypertension, migraine headaches.  Lives with her husband, has 2 young children.  One level home.  Family members in the area to assist as needed.  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 Presented on April 01, 2016, with left facial droop, right gaze preference, and headache.  Systolic blood pressure greater than 200. Cranial CT scan showed focal area of hemorrhage arising in the posterolateral right basal ganglia with surrounding edema.  Negative arterial findings on CTA of head and neck.  Echocardiogram ejection fraction of 123456 grade 2 diastolic dysfunction.  Bouts of hypokalemia, supplement added.  Neurology followup with conservative care.  Followup CT of the head April 04, 2016, stable 4 mm right-to-left midline shift, not significantly change.  Maintained on a dysphagia #2 thin liquid diet.  Close monitoring of blood pressure.  Subcutaneous heparin for DVT prophylaxis initiated on April 07, 2016.  The patient was admitted for a comprehensive rehab program.  PAST MEDICAL HISTORY:  See discharge diagnoses.  SOCIAL HISTORY:  Lives with spouse and 2 young children, independent prior to admission.  Functional status upon admission to rehab services was +2 physical assist, supine to sit; +2 physical assist,  sit to supine; max to total assist for activities of daily living.  PHYSICAL EXAMINATION:  VITAL SIGNS:  Blood pressure 149/71, pulse 52, temp 97, respirations 18. GENERAL:  This was an alert female, right gaze preference, left neglect, inconsistent to follow commands, expressive greater than receptive aphasia. LUNGS:  Clear to auscultation without wheeze. CARDIAC:  Regular rate and rhythm without murmur. ABDOMEN:  Soft, nontender.  Good bowel sounds.  REHABILITATION HOSPITAL COURSE:  The patient was admitted to inpatient rehab services with therapies initiated on a 3-hour daily basis, consisting of physical therapy, occupational therapy, speech therapy, and rehabilitation nursing.  The following issues were addressed during the patient's rehabilitation stay.  Pertaining to Ms. Jolicoeur's right basal ganglia hemorrhage secondary to hypertension remained stable.  She would follow up with neurology Services.  Blood pressures controlled, monitored on lisinopril as well as Bystolic.  She would follow up with her primary MD.  Pain management, migraine headaches, she was using Fioricet for headaches as well as Robaxin.  Her diet was slowly advanced to a mechanical soft.  She was on Marinol to help stimulate appetite. She continued on Lipitor for hyperlipidemia.  Noted spasticity controlled with baclofen.  Monitor for any signs of somnolence.  The patient received weekly collaborative interdisciplinary team conferences to discuss estimated length of stay, family teaching, and any barriers to discharge.  The patient propels wheelchair with a right hemi- technique modified independent to the gym.  Gait training 30 feet hemi- walker, moderate assist for upright posture and progressed to left lower extremity.  Dynamic balance at supervision.  Activities of daily living and homemaking, she was able to communicate her needs, performed supine to sit with steady assistance with use of bed rail.   Performed stand pivot transfers, with bed to wheelchair with minimal assistance. Perform stand pivot transfers with use of grab bar and to stand at commode with moderate assistance.  Speech therapy followup.  Her diet was slowly advanced.  Sessions focused on cognitive linguistic goals for the patient to communicate at the phrase in sentence level.  [name] educated in regard to the patient's language impairments and strategies to utilize to maximize verbal expression. Full family teaching was completed and plan discharge to home on May 08, 2016.  DISCHARGE MEDICATIONS: 1. Lipitor 20 mg p.o. daily. 2. Baclofen 5 mg p.o. t.i.d. 3. Fioricet 1 tab every 4 hours as needed for headache, dispense of 90     tablets. 4. Prozac 20 mg p.o. daily. 5. Lisinopril 2.5 mg p.o. daily. 6. Robaxin 500 mg p.o. every 6 hours as needed for muscle spasms. 7. Bystolic 5 mg p.o. every 12 hours. 8. Protonix 40 mg p.o. daily.  DIET:  Mechanical soft with thin liquids.  FOLLOWUP:  She would follow up with Dr. Delice Lesch at the outpatient rehab service office as directed; Dr. Erlinda Hong, Neurology Services, 1 month call for appointment; Dr. Kathlene November, medical management.     Lauraine Rinne, P.A.   ______________________________ Delice Lesch, MD    DA/MEDQ  D:  05/07/2016  T:  05/07/2016  Job:  [id number]  cc:   Rosalin Hawking, MD Kathlene November, MD

## 2016-05-07 NOTE — Progress Notes (Signed)
Social Work Patient ID: Abigail Campos, female   DOB: 1971/02/14, 45 y.o.   MRN: [id number]  Team conference reviewed with pt and family.  All very pleased with gains and feeling ready for d/c tomorrow.  ALL DME and [place] arrangements made.  No concerns.  [name], LCSW

## 2016-05-07 NOTE — Progress Notes (Signed)
Occupational Therapy Session Note  Patient Details  Name: Abigail Campos MRN: [id number] Date of Birth: 1971-05-07  Today's Date: 05/07/2016 OT Individual Time: 1000-1100 OT Individual Time Calculation (min): 60 min    Short Term Goals: Week 4:  OT Short Term Goal 1 (Week 4): STGs=LTGs secondary to upcoming discharge  Skilled Therapeutic Interventions/Progress Updates:    Upon entering the room, pt supine in bed with mother present in room to complete family education. Caregiver has been present for ADL session. Caregiver demonstrated prior understanding of education by assisting pt in appropriate and safe ways in order for pt to be as independent as possible. Pt performed UB self care while seated on EOB with close supervision for balance. LB performed with pt supine in bed. Caregiver transferred pt from wheelchair >bed with proper cues given and safe body mechanics. No further questions at this time. Pt remained in wheelchair with her mother present in the room.   Therapy Documentation Precautions:  Precautions Precautions: Fall Precaution Comments: dense L hemi, impulsive Restrictions Weight Bearing Restrictions: No General:   Vital Signs: Therapy Vitals Temp: 98.4 F (36.9 C) Temp Source: Oral Pulse Rate: 61 Resp: 18 BP: 108/65 mmHg Patient Position (if appropriate): Sitting Oxygen Therapy SpO2: 97 % O2 Device: Not Delivered Pain: Pain Assessment Pain Assessment: No/denies pain  See Function Navigator for Current Functional Status.   Therapy/Group: Individual Therapy  Phineas Semen 05/07/2016, 3:49 PM

## 2016-05-07 NOTE — Discharge Instructions (Signed)
Inpatient Rehab Discharge Instructions  Abigail Campos Discharge date and time: No discharge date for patient encounter.   Activities/Precautions/ Functional Status: Activity: As tolerated Diet: Mechanical soft Wound Care: None needed Functional status:  ___ No restrictions     ___ Walk up steps independently ___ 24/7 supervision/assistance   ___ Walk up steps with assistance ___ Intermittent supervision/assistance  ___ Bathe/dress independently ___ Walk with walker     _x__ Bathe/dress with assistance ___ Walk Independently    ___ Shower independently ___ Walk with assistance    ___ Shower with assistance ___ No alcohol     ___ Return to work/school ________  Special Instructions: Follow-up Dr. Allena Campos rehabilitation services [address]  [place] [phone]. Office to call to arrange appointment   My questions have been answered and I understand these instructions. I will adhere to these goals and the provided educational materials after my discharge from the hospital.  Patient/Caregiver Signature _______________________________ Date __________  Clinician Signature _______________________________________ Date __________  Please bring this form and your medication list with you to all your follow-up doctor's appointments. Inpatient Rehab Discharge Instructions  Abigail CassisGayla M Campos [facility] Discharge date and time: No discharge date for patient encounter.   Activities/Precautions/ Functional Status: Activity: activity as tolerated Diet: Mechanical soft Wound Care: none needed Functional status:  ___ No restrictions     ___ Walk up steps independently ___ 24/7 supervision/assistance   ___ Walk up steps with assistance ___ Intermittent supervision/assistance  ___ Bathe/dress independently ___ Walk with walker     _x__ Bathe/dress with assistance ___ Walk Independently    ___ Shower independently ___ Walk with assistance    ___ Shower with assistance ___ No  alcohol     ___ Return to work/school ________   COMMUNITY REFERRALS UPON DISCHARGE:    Home Health:   PT     OT     ST                       Agency[facility] Phone: [phone]   Medical Equipment/Items Ordered: wheelchair, drop arm commode                                                    Agency/Supplier: [facility] @ [phone]   GENERAL COMMUNITY RESOURCES FOR PATIENT/FAMILY:  Support Groups: Stroke Support Group                             2nd Thursday of each month @ 3:00 pm                             [facility]                             Contact = Callie FieldingKatie Pittman @ [phone]       Special Instructions: No driving   [facility] [address] [place], [place] phone number [phone]. This office will call to arrange appointment.   STROKE/TIA DISCHARGE INSTRUCTIONS SMOKING Cigarette smoking nearly doubles your risk of having a stroke & is the single most alterable risk factor  If you smoke or have smoked in the  last 12 months, you are advised to quit smoking for your health.  Most of the excess cardiovascular risk related to smoking disappears within a year of stopping.  Ask you doctor about anti-smoking medications  [place] Quit Line: 1-800-QUIT NOW  Free Smoking Cessation Classes [phone]  CHOLESTEROL Know your levels; limit fat & cholesterol in your diet  Lipid Panel     Component Value Date/Time   CHOL 169 04/04/2016 0415   TRIG 66 04/05/2016 0739   HDL 37* 04/04/2016 0415   CHOLHDL 4.6 04/04/2016 0415   VLDL 19 04/04/2016 0415   LDLCALC 113* 04/04/2016 0415      Many patients benefit from treatment even if their cholesterol is at goal.  Goal: Total Cholesterol (CHOL) less than 160  Goal:  Triglycerides (TRIG) less than 150  Goal:  HDL greater than 40  Goal:  LDL (LDLCALC) less than 100   BLOOD PRESSURE American Stroke Association blood pressure target is less that 120/80 mm/Hg   Your discharge blood pressure is:  BP: 122/71 mmHg  Monitor your blood pressure  Limit your salt and alcohol intake  Many individuals will require more than one medication for high blood pressure  DIABETES (A1c is a blood sugar average for last 3 months) Goal HGBA1c is under 7% (HBGA1c is blood sugar average for last 3 months)  Diabetes: No known diagnosis of diabetes    Lab Results  Component Value Date   HGBA1C 6.0* 04/04/2016     Your HGBA1c can be lowered with medications, healthy diet, and exercise.  Check your blood sugar as directed by your physician  Call your physician if you experience unexplained or low blood sugars.  PHYSICAL ACTIVITY/REHABILITATION Goal is 30 minutes at least 4 days per week  Activity: Increase activity slowly, Therapies: Physical Therapy: Home Health Return to work:   Activity decreases your risk of heart attack and stroke and makes your heart stronger.  It helps control your weight and blood pressure; helps you relax and can improve your mood.  Participate in a regular exercise program.  Talk with your doctor about the best form of exercise for you (dancing, walking, swimming, cycling).  DIET/WEIGHT Goal is to maintain a healthy weight  Your discharge diet is: DIET DYS 3 Room service appropriate?: Yes; Fluid consistency:: Thin  liquids Your height is:    Your current weight is: Weight: 66.86 kg (147 lb 6.4 oz) Your Body Mass Index (BMI) is:     Following the type of diet specifically designed for you will help prevent another stroke.  Your goal weight range is:    Your goal Body Mass Index (BMI) is 19-24.  Healthy food habits can help reduce 3 risk factors for stroke:  High cholesterol, hypertension, and excess weight.  RESOURCES Stroke/Support Group:  Call [phone]   STROKE EDUCATION PROVIDED/REVIEWED AND GIVEN TO PATIENT Stroke warning signs and symptoms How to activate emergency medical system (call 911). Medications prescribed at  discharge. Need for follow-up after discharge. Personal risk factors for stroke. Pneumonia vaccine given:  Flu vaccine given:  My questions have been answered, the writing is legible, and I understand these instructions.  I will adhere to these goals & educational materials that have been provided to me after my discharge from the hospital.       My questions have been answered and I understand these instructions. I will adhere to these goals and the provided educational materials after my discharge from the hospital.  Patient/Caregiver Signature _______________________________  Date __________  Clinician Signature _______________________________________ Date __________  Please bring this form and your medication list with you to all your follow-up doctor's appointments.

## 2016-05-07 NOTE — Progress Notes (Signed)
Physical Therapy Session Note  Patient Details  Name: Abigail Campos MRN: [id number] Date of Birth: [birth year]  Today's Date: 05/07/2016 PT Individual Time: 5093-2671 PT Individual Time Calculation (min): 49 min   Short Term Goals: Week 4:  PT Short Term Goal 1 (Week 4): =LTGs due to ELOS  Skilled Therapeutic Interventions/Progress Updates:    Pt received resting upright in w/c with no c/o pain and agreeable to therapy session.  Session focus on car transfers, furniture transfers, w/c propulsion, and strength/balance assessment.    Pt propels w/c throughout unit mod I with occasional short rest breaks for RUE fatigue.  PT provided encouragement for pt to use both RUE and RLE to assist with w/c propulsion.    Pt transfers to/from car with overall mod assist to transfer to L and min assist to transfer to R with therapist blocking L knee.  Pt transfers w/c<>therapy mat with min assist to R side.  Pt positioned self in R side lying for assessment of LLE strength using powder board for decreased gravity and friction.  Pt noted to have trace hamstring activation and 2-/5 for knee extension and hip flexion but transitioned to compensatory trunk movement to achieve full ROM.    Pt transitioned back to w/c and returned to room at end of session and left upright with call bell in reach and needs met.   Therapy Documentation Precautions:  Precautions Precautions: Fall Precaution Comments: dense L hemi, impulsive Restrictions Weight Bearing Restrictions: No   See Function Navigator for Current Functional Status.   Therapy/Group: Individual Therapy  [name] 05/07/2016, 4:44 PM

## 2016-05-07 NOTE — Progress Notes (Signed)
Physical Therapy Session Note  Patient Details  Name: Abigail Campos MRN: [id number] Date of Birth: [birth year]  Today's Date: 05/07/2016 PT Individual Time: 1123-1210 PT Individual Time Calculation (min): 47 min   Short Term Goals: Week 4:  PT Short Term Goal 1 (Week 4): =LTGs due to ELOS  Skilled Therapeutic Interventions/Progress Updates:    Pt received resting in w/c with no c/o pain and agreeable to therapy session.  Session focus on LLE NMR, gait with hemiwalker, and d/c planning with pt's mother.    Pt propelled w/c to therapy gym mod I with 1 short rest break 2/2 UE fatigue.  PT instructed pt in rapid step ups onto 3" step with RLE, weight shift R<>L, and knee "bounces" each to fatigue over 2 periods of standing with mod/max assist to facilitate upright posture and L knee control.    Gait training with hemiwalker [phone] with initially mod assist fade to max with pt fatigue.  Pt became excessively fatigued after 68' and required w/c to be brought up behind her.    PT and pt and pt's mother discussed upcoming d/c and pt and mother asked appropriate questions regarding f/u, HHPT, use of hemiwalker with therapy, and progression of recovery once at home.  PT answered within scope.    Pt returned to room with family at end of session.    Therapy Documentation Precautions:  Precautions Precautions: Fall Precaution Comments: dense L hemi, impulsive Restrictions Weight Bearing Restrictions: No   See Function Navigator for Current Functional Status.   Therapy/Group: Individual Therapy  [name] 05/07/2016, 1:05 PM

## 2016-05-07 NOTE — Progress Notes (Signed)
Speech Language Pathology Daily Session Note  Patient Details  Name: Abigail Campos MRN: [id number] Date of Birth: Aug 01, 1971  Today's Date: 05/07/2016 SLP Individual Time: 1300-1357 SLP Individual Time Calculation (min): 29 min  Short Term Goals: Week 4: SLP Short Term Goal 1 (Week 4): onsume current diet with minimal overt s/s of aspiration with suervision verbal cues for use of swallowing strategies.  SLP Short Term Goal 2 (Week 4): Patient will demonstrate selective attention to functional tasks for 30 minutes with Min A verbal cues for redirection in a mildly distracting enviornment.   SLP Short Term Goal 3 (Week 4): Patient will attend to left field of enviornment during functional tasks with supervision verbal cues.  SLP Short Term Goal 4 (Week 4): Patient will answer basic yes/no questions with 75% accuracy in regards to wants/needs with Min A multimodal cues.  SLP Short Term Goal 5 (Week 4): Patient will express wants/needs at the phrase level (3-4 word utterances) with Max A multimodal cues.  SLP Short Term Goal 6 (Week 4): Patient will self-monitor and correct phonemic errors at the word level in 75% of opportunities with Min A verbal cues.  SLP Short Term Goal 6 - Progress (Week 4): Met  Skilled Therapeutic Interventions: Pt seen for speech language therapy with focus on pt/family education and strategies for continuation of progress. Pt demonstrated emerging and partial awareness of level of deficits/need for assist; however pt's family verbalized good understanding of all education. Other focus on generating sentence level production. Pt required min- mod A for structure words and clarifying questions to encourage further detail. Pt able to consistently make an effort to form sentence level communication at mod I (without verbal cueing).    Function:  Eating Eating   Modified Consistency Diet: Yes Eating Assist Level: Set up assist for;Supervision or verbal cues;Helper  checks for pocketed food   Eating Set Up Assist For: Opening containers;Cutting food       Cognition Comprehension Comprehension assist level: Follows complex conversation/direction with extra time/assistive device  Expression   Expression assist level: Expresses basic 50 - 74% of the time/requires cueing 25 - 49% of the time. Needs to repeat parts of sentences.  Social Interaction Social Interaction assist level: Interacts appropriately with others with medication or extra time (anti-anxiety, antidepressant).  Problem Solving Problem solving assist level: Solves basic 75 - 89% of the time/requires cueing 10 - 24% of the time  Memory Memory assist level: Recognizes or recalls 75 - 89% of the time/requires cueing 10 - 24% of the time    Pain Pain Assessment Pain Assessment: No/denies pain  Therapy/Group: Individual Therapy  Vinetta Bergamo MA, CCC-SLP 05/07/2016, 4:52 PM

## 2016-05-07 NOTE — Discharge Summary (Signed)
Discharge summary job [id number]

## 2016-05-07 NOTE — Progress Notes (Signed)
[place] PHYSICAL MEDICINE & REHABILITATION     PROGRESS NOTE  Subjective/Complaints:  Patient lying in bed AM. Her aphasia continues to improve.  She does not have any complaints at present.    ROS: full ROS limited due to language, but appears to deny CP, SOB, nausea, vomiting, diarrhea..  Objective: Vital Signs: Blood pressure 109/64, pulse 56, temperature 97.9 F (36.6 C), temperature source Oral, resp. rate 18, weight 66.134 kg (145 lb 12.8 oz), SpO2 98 %. No results found. No results for input(s): WBC, HGB, HCT, PLT in the last 72 hours. No results for input(s): NA, K, CL, GLUCOSE, BUN, CREATININE, CALCIUM in the last 72 hours.  Invalid input(s): CO CBG (last 3)  No results for input(s): GLUCAP in the last 72 hours.  Wt Readings from Last 3 Encounters:  05/07/16 66.134 kg (145 lb 12.8 oz)  04/06/16 73.6 kg (162 lb 4.1 oz)  01/22/16 74.9 kg (165 lb 2 oz)    Physical Exam:  BP 109/64 mmHg  Pulse 56  Temp(Src) 97.9 F (36.6 C) (Oral)  Resp 18  Wt 66.134 kg (145 lb 12.8 oz)  SpO2 98% Constitutional: She appears well-developed.  NAD HENT:  Normocephalic and atraumatic.  Eyes: Conjunctivae and EOM are normal.     Cardiovascular: Normal rate and regular rhythm.    Respiratory: Effort normal and breath sounds normal. No respiratory distress.   GI: Soft. Bowel sounds are normal. She exhibits no distension.  Musculoskeletal: She exhibits no edema or no tenderness.  Neurological: She is Alert and oriented 3 with increased time.  Inconsistently follows commands, although significantly improved  mainly expressive aphasia, with word finding difficulty, with minimal receptive deficits. Dysarthria Left facial weakness Motor: RUE/RLE: 5/5 proximal to distal.  Left upper/lower extremity: 0/5.  Modified [place] 1/4 left elbow flexion.  Skin: Skin is warm and dry.  Psychiatric:  Smiling, cooperative  Assessment/Plan: 1. Functional deficits secondary to right basal ganglia  hemorrhage which require 3+ hours per day of interdisciplinary therapy in a comprehensive inpatient rehab setting. Physiatrist is providing close team supervision and 24 hour management of active medical problems listed below. Physiatrist and rehab team continue to assess barriers to discharge/monitor patient progress toward functional and medical goals.  Function:  Bathing Bathing position Bathing activity did not occur: Refused Position: Shower  Bathing parts Body parts bathed by patient: Chest, Abdomen, Left arm, Right upper leg, Left upper leg, Right lower leg, Front perineal area Body parts bathed by helper: Back, Left lower leg, Right arm, Buttocks  Bathing assist Assist Level:  (mod a)      Upper Body Dressing/Undressing Upper body dressing Upper body dressing/undressing activity did not occur: Refused What is the patient wearing?: Pull over shirt/dress, Bra Bra - Perfomed by patient: Thread/unthread right bra strap Bra - Perfomed by helper: Thread/unthread left bra strap, Hook/unhook bra (pull down sports bra) Pull over shirt/dress - Perfomed by patient: Thread/unthread right sleeve, Put head through opening, Pull shirt over trunk Pull over shirt/dress - Perfomed by helper: Thread/unthread left sleeve        Upper body assist Assist Level:  (mod A)      Lower Body Dressing/Undressing Lower body dressing Lower body dressing/undressing activity did not occur: Refused What is the patient wearing?: Pants, Non-skid slipper socks     Pants- Performed by patient: Thread/unthread right pants leg Pants- Performed by helper: Thread/unthread left pants leg, Pull pants up/down   Non-skid slipper socks- Performed by helper: Don/doff right sock, Don/doff left  sock               TED Hose - Performed by helper: Don/doff right TED hose, Don/doff left TED hose  Lower body assist Assist for lower body dressing:  (max A)      Toileting Toileting Toileting activity did not occur:  No continent bowel/bladder event Toileting steps completed by patient: Adjust clothing prior to toileting, Performs perineal hygiene Toileting steps completed by helper: Adjust clothing after toileting Toileting Assistive Devices: Grab bar or rail  Toileting assist Assist level: Touching or steadying assistance (Pt.75%)   Transfers Chair/bed transfer   Chair/bed transfer method: Stand pivot Chair/bed transfer assist level: Touching or steadying assistance (Pt > 75%) Chair/bed transfer assistive device: Armrests     Locomotion Ambulation Ambulation activity did not occur: Safety/medical concerns   Max distance: 25+30 Assist level: Moderate assist (Pt 50 - 74%)   Wheelchair   Type: Manual Max wheelchair distance: 200 Assist Level: No help, No cues, assistive device, takes more than reasonable amount of time  Cognition Comprehension Comprehension assist level: Follows complex conversation/direction with extra time/assistive device  Expression Expression assist level: Expresses basic 50 - 74% of the time/requires cueing 25 - 49% of the time. Needs to repeat parts of sentences.  Social Interaction Social Interaction assist level: Interacts appropriately with others with medication or extra time (anti-anxiety, antidepressant).  Problem Solving Problem solving assist level: Solves basic 75 - 89% of the time/requires cueing 10 - 24% of the time  Memory Memory assist level: Recognizes or recalls 75 - 89% of the time/requires cueing 10 - 24% of the time     Medical Problem List and Plan: 1.Left hemiplegia, aphasia, dysphagia secondary to right basal ganglia hemorrhage secondary to hypertensive crisis on 04/01/16.   Continue CIR therapies, plan for d/c tomorrow  Will see pt for transitional care management in 1-2 post-discharge  PRAFO for left lower extremity and splints for left upper extremity ordered  Fluoxetine 10 started on 5/1, Increased to 20 on 5/8 2. DVT  Prophylaxis/Anticoagulation: Subcutaneous heparin for DVT prophylaxis  3. Pain Management/migraine headaches: Fioricet for headaches and Robaxin. 4. Dysphagia. #2 thin liquid diet, Advanced to dysphagia 3 with thin liquids. Speech therapy follow-up. Will continue to advance diet as tolerated  Remeron changed to Marinol on 5/12, increased on 5/16.   Intake improving. 5. Neuropsych: This patient is not capable of making decisions on her own behalf. 6. Skin/Wound Care: Routine skin checks 7. Fluids/Electrolytes/Nutrition: Routine I&O   BMP within acceptable range on 5/19 8. Hypertension now with hypotension,   Plendil 10 mg daily DC'd on 5/15  Hydralazine 10 mg 3 times a day, DC'd on 5/10   Lisinopril 20 mg twice a day, increased to 40 mg on 4/28, changed to 40 mg daily on 5/11, decreased to 20 mg daily on 5/12, decreased to 5 mg on 5/15, decreased to 2.5 on [id number]  Bysystolic 20 mg every 12 hours, decreased to 5 mg on 5/9  Will cont to monitor, patient stable in a symptomatically at this point. Due to questionable left ventricular hypertrophy, will continue low dose statin and beta blocker. 9. Hyperlipidemia. Lipitor 10. Spasticity: Controlled  Baclofen 5 mg on 4/27, DC'd on 5/10, restarted on 5/22. Will monitor for signs of somnolence. 16. Thrush:   Treated with Diflucan, [place] on 5/3 12. AKI  Creatinine 1.01 5/19, improving.   -continue to encourage fluids 13. Right ear itching: Resolved  Treated with Debrox  LOS (Days) 29 A FACE  TO FACE EVALUATION WAS PERFORMED  Ankit [name] 05/07/2016 8:07 AM

## 2016-05-07 NOTE — Patient Care Conference (Signed)
Inpatient RehabilitationTeam Conference and Plan of Care Update Date: 05/06/2016   Time: 2:30 PM    Patient Name: Abigail Campos      Medical Record Number: [id number]  Date of Birth: 1971/05/16 Sex: Female         Room/Bed: 4W05C/4W05C-01 Payor Info: Payor: Holland Falling / Plan: Alwyn Pea / Product Type: *No Product type* /    Admitting Diagnosis: R  BG hemorrhage  Admit Date/Time:  04/08/2016  6:25 PM Admission Comments: No comment available   Primary Diagnosis:  <principal problem not specified> Principal Problem: <principal problem not specified>  Patient Active Problem List   Diagnosis Date Noted  . Ear itching   . Poor fluid intake   . AKI (acute kidney injury) ([place])   . Bradycardia, drug induced   . Hypotension due to drugs   . Poor nutrition   . Muscle spasticity   . Hypertensive emergency 04/08/2016  . Obesity (BMI 30.0-34.9) 04/08/2016  . Hyperlipidemia 04/08/2016  . Basal ganglia hemorrhage ([place]) 04/08/2016  . Aphasia as late effect of cerebrovascular accident   . Dysphagia as late effect of cerebrovascular disease   . Migraine with aura and without status migrainosus, not intractable   . HLD (hyperlipidemia)   . Benign essential HTN   . Gait disturbance, post-stroke   . Hemiplegia, post-stroke ([place])   . Hypokalemia   . Dysphagia, post-stroke   . Aphasia, post-stroke   . Bradycardia   . Headache, migraine   . Dysarthria, post-stroke   . Encephalopathy acute 04/02/2016  . ICH (intracerebral hemorrhage) ([facility]) - R basal ganglia due to hypertensive emergency 04/01/2016  . Upper airway cough syndrome 12/06/2015  . PCP NOTES >>> 09/25/2015  . Acute bronchitis 11/06/2014  . Need for hepatitis B vaccination 10/19/2014  . Insomnia 12/28/2012  . Annual physical exam 11/23/2011  . Hemorrhoids 10/13/2011  . Morbid obesity ([place]) 02/26/2011  . CHEST PAIN UNSPECIFIED 03/28/2010  . Essential hypertension 10/19/2007  . Headache(784.0) 05/12/2007    Expected Discharge  Date: Expected Discharge Date: 05/08/16  Team Members Present: Physician leading conference: Dr. Delice Lesch Social Worker Present: Lennart Pall, LCSW Nurse Present: Dorien Chihuahua, RN PT Present: Dwyane Dee, PT OT Present: Benay Pillow, OT SLP Present: Weston Anna, SLP PPS Coordinator present : Daiva Nakayama, RN, CRRN     Current Status/Progress Goal Weekly Team Focus  Medical   Left hemiplegia, aphasia, dysphagia secondary to right basal ganglia hemorrhage secondary to hypertensive crisis on 04/01/16.   Improve mobility, transfers, pt and family edu, intake, blood pressures  see above   Bowel/Bladder   continent with timed toileting LBM 05/05/16  Min assist  continue to monitor q shift   Swallow/Nutrition/ Hydration   Dys. 3 textures with thin liquids, supervision for use of swallow strategies  Min A with least restrictive diet  Family Education    ADL's   min A for sit <>stand, mod A stand pivot transfer into wheelchair, mod - max A shower and toilet transfer, mod A bathing, mod A UB self care, max A LB self care, mod A toileting  set up for grooming and eating, min A for UB self care, mod A for all other goals  L UE NMR, pt/family edu, balance, self care retraining, d/c planning, transfers   Mobility   supervision/min assist for bed mobility without rail, min assist for transfers to R side, mod assist for transfers to L, gait with hemiwalker in gym up to 40' with mod/max assist, static sitting supervision,  impulsivity improving  mod assist overall for transfers  gait training with hemiwalker for balance and LLE forced use, dynamic sitting balance, postural control, transfers, family education   Communication   Min-Mod A  Min A   Family Education    Safety/Cognition/ Behavioral Observations  Min A  Min assist  Family Education    Pain   C/o headaches at times. Fioricet effective for severe pain. Tylenol effective for minor pain  pain less than or equal to 2  continue to  monitor q shift   Skin   no skin breakdown this admission  min assist  monitor    Rehab Goals Patient on target to meet rehab goals: Yes *See Care Plan and progress notes for long and short-term goals.  Barriers to Discharge: poor appetite, flaccid L side, AKI, safety awareness    Possible Resolutions to Barriers:  pt and family edu, appetite improving    Discharge Planning/Teaching Needs:  Plan to d/c home with husband and other family members providing 24/7 assistance.  all family ed completed   Team Discussion:  Continues to make good functional gains overall. Some contraction noted with gait in LE.  D3, thin with mild pocketing.  Improved self-monitoring and correction.  Education with family completed.  Revisions to Treatment Plan:  None   Continued Need for Acute Rehabilitation Level of Care: The patient requires daily medical management by a physician with specialized training in physical medicine and rehabilitation for the following conditions: Daily direction of a multidisciplinary physical rehabilitation program to ensure safe treatment while eliciting the highest outcome that is of practical value to the patient.: Yes Daily medical management of patient stability for increased activity during participation in an intensive rehabilitation regime.: Yes Daily analysis of laboratory values and/or radiology reports with any subsequent need for medication adjustment of medical intervention for : Neurological problems;Blood pressure problems  Derek Huneycutt 05/07/2016, 3:20 PM

## 2016-05-08 ENCOUNTER — Inpatient Hospital Stay (HOSPITAL_COMMUNITY): Payer: Managed Care, Other (non HMO) | Admitting: Occupational Therapy

## 2016-05-08 ENCOUNTER — Ambulatory Visit (HOSPITAL_COMMUNITY): Payer: Managed Care, Other (non HMO) | Admitting: Speech Pathology

## 2016-05-08 ENCOUNTER — Inpatient Hospital Stay (HOSPITAL_COMMUNITY): Payer: Managed Care, Other (non HMO) | Admitting: Physical Therapy

## 2016-05-08 MED ORDER — BACLOFEN 10 MG PO TABS: 5.0000 mg | ORAL_TABLET | Freq: Three times a day (TID) | ORAL | Status: DC

## 2016-05-08 MED ORDER — FLUOXETINE HCL 20 MG PO CAPS: 20.0000 mg | ORAL_CAPSULE | Freq: Every day | ORAL | Status: DC

## 2016-05-08 MED ORDER — BUTALBITAL-APAP-CAFFEINE 50-325-40 MG PO TABS: 1.0000 | ORAL_TABLET | ORAL | Status: DC | PRN

## 2016-05-08 MED ORDER — METHOCARBAMOL 500 MG PO TABS: 500.0000 mg | ORAL_TABLET | Freq: Four times a day (QID) | ORAL | Status: DC | PRN

## 2016-05-08 MED ORDER — NEBIVOLOL HCL 5 MG PO TABS: 5.0000 mg | ORAL_TABLET | Freq: Two times a day (BID) | ORAL | Status: DC

## 2016-05-08 MED ORDER — LISINOPRIL 2.5 MG PO TABS: 2.5000 mg | ORAL_TABLET | Freq: Every day | ORAL | Status: DC

## 2016-05-08 MED ORDER — ATORVASTATIN CALCIUM 20 MG PO TABS: 20.0000 mg | ORAL_TABLET | Freq: Every day | ORAL | Status: DC

## 2016-05-08 NOTE — Progress Notes (Signed)
Speech Language Pathology Session Note & Discharge Summary  Patient Details  Name: Abigail Campos MRN: [id number] Date of Birth: [birth year]  Today's Date: 05/08/2016 SLP Individual Time: 0900-0925 SLP Individual Time Calculation (min): 25 min  Skilled Therapeutic Interventions:  Skilled treatment session focused on cognitive-linguistic goals. Patient answered basic yes/no questions with 100% accuracy and complex yes/no questions with 70% accuracy. Patient also followed 1 & 2 step commands with extra time and 100% accuracy and followed multi-step commands with 60% accuracy. Patient named functional items with 100% accuracy and completed responsive naming tasks with 100% accuracy and category exclusion tasks with 100% accuracy. Patient required Min A question cues for convergent naming tasks with 60% accuracy and divergent naming tasks with 100% accuracy with Min A verbal cues. Patient was 100% accurate for reading comprehension at the sentence and paragraph level and completed written expression tasks at the word and phrase level with Mod A verbal and visual cues to self-monitor and correct errors. Patient left upright in wheelchair with all needs within reach. Continue with current plan of care.   Patient has met 9 of 9 long term goals.  Patient to discharge at overall Min;Mod level.   Reasons goals not met: N/A   Clinical Impression/Discharge Summary: Patient has made excellent gains and has met 9 of 9 LTGs this admission due to improved swallowing and cognitive function as well as functional communication. Currently, patient is consuming Dys. 3 textures with thin liquids without overt s/s of aspiration and requires supervision verbal cues for use of swallowing compensatory strategies. Patient also requires Min-Mod A verbal and question cues to complete functional and familiar tasks safely in regards to functional problem solving, selective attention, emergent awareness, recall of new information  and overall safety awareness. Patient can understand basic-mildly complex information with overall Min A cueing and can verbalize her wants/needs at the phrase-sentence level with extra time and overall Mod A cueing. Patient and family education is complete and patient will discharge home with 24 hour supervision from family. Patient would benefit from f/u SLP services to maximize her cognitive and swallowing function as well as her functional communication in order to maximize her overall functional independence and reduce caregiver burden.   Care Partner:  Caregiver Able to Provide Assistance: Yes  Type of Caregiver Assistance: Physical;Cognitive  Recommendation:  Home Health SLP;24 hour supervision/assistance  Rationale for SLP Follow Up: Maximize cognitive function and independence;Reduce caregiver burden;Maximize functional communication;Maximize swallowing safety   Equipment: N/A   Reasons for discharge: Treatment goals met;Discharged from hospital   Patient/Family Agrees with Progress Made and Goals Achieved: Yes   Function:  Cognition Comprehension Comprehension assist level: Understands complex 90% of the time/cues 10% of the time  Expression   Expression assist level: Expresses basic 50 - 74% of the time/requires cueing 25 - 49% of the time. Needs to repeat parts of sentences.  Social Interaction Social Interaction assist level: Interacts appropriately with others with medication or extra time (anti-anxiety, antidepressant).  Problem Solving Problem solving assist level: Solves basic 75 - 89% of the time/requires cueing 10 - 24% of the time  Memory Memory assist level: Recognizes or recalls 75 - 89% of the time/requires cueing 10 - 24% of the time   Cheng Dec 05/08/2016, 10:45 AM

## 2016-05-08 NOTE — Progress Notes (Signed)
Physical Therapy Session Note  Patient Details  Name: Abigail Campos MRN: [id number] Date of Birth: 1971-10-07  Today's Date: 05/08/2016 PT Individual Time: [id number] PT Individual Time Calculation (min): 30 min   Short Term Goals: Week 4:  PT Short Term Goal 1 (Week 4): =LTGs due to ELOS  Skilled Therapeutic Interventions/Progress Updates:   Pt seen for last therapy session before D/C to assess equipment, w/c mobility, gait.  Pt noted to have 2-3 inches of hip space in w/c causing pt to keep hips to R and leaning L with LUE wedged in extra space.  Seat depth also cause pt to have increased difficulty with w/c mobility with hemi technique.  SW alerted to have chair changed to 16x16 hemi height.  Pt performed w/c mobility to gym mod I.  Pt performed gait x 50' with hemi walker on R and therapist providing mod A and facilitation on L to maintain upright trunk, R lateral weight shifting, verbal cues for sequencing and advancement and stabilization of LLE in stance.  Also performed stair negotiation for further NMR/training up 4 stairs forwards, down backwards with RUE support on rail and therapist on L providing trunk control, weight shifting and advancement/stabilization of LLE.  Pt tolerated well.  Returned to room and pt left with all items within reach to await husband's arrival.  Therapy Documentation Precautions:  Precautions Precautions: Fall Precaution Comments: dense L hemi, impulsive Restrictions Weight Bearing Restrictions: No Pain: Pain Assessment Pain Assessment: No/denies pain Pain Score: 0-No pain   See Function Navigator for Current Functional Status.   Therapy/Group: Individual Therapy  [name] [facility] 05/08/2016, 12:42 PM

## 2016-05-08 NOTE — Progress Notes (Signed)
Social Work  Discharge Note  The overall goal for the admission was met for:   Discharge location: Yes - home with family providing 24/7 assistance (mother's home)  Length of Stay: Yes - 30 days  Discharge activity level: Yes - min/mod assist at w/c level  Home/community participation: Yes  Services provided included: MD, RD, PT, OT, SLP, RN, TR, Pharmacy, [place]: Private Insurance: [employer]  Follow-up services arranged: Home Health: PT, OT, ST via [place], DME: 18x18 ultra hemi w/c, cushion, drop arm commode via [facility] and Patient/Family has no preference for HH/DME agencies  Comments (or additional information):  Patient/Family verbalized understanding of follow-up arrangements: Yes  Individual responsible for coordination of the follow-up plan: pt/spouse  Confirmed correct DME delivered: Chanel Mcadams 05/08/2016    Jessicca Stitzer

## 2016-05-08 NOTE — Progress Notes (Signed)
[place] PHYSICAL MEDICINE & REHABILITATION     PROGRESS NOTE  Subjective/Complaints:  Pt laying in bed.  When asked what day it is, she immediately replies, "my last day here".  Continues to improve.   ROS: full ROS limited due to language, but appears to deny CP, SOB, nausea, vomiting, diarrhea..  Objective: Vital Signs: Blood pressure 95/57, pulse 54, temperature 98 F (36.7 C), temperature source Oral, resp. rate 18, weight 66.407 kg (146 lb 6.4 oz), SpO2 99 %. No results found. No results for input(s): WBC, HGB, HCT, PLT in the last 72 hours. No results for input(s): NA, K, CL, GLUCOSE, BUN, CREATININE, CALCIUM in the last 72 hours.  Invalid input(s): CO CBG (last 3)  No results for input(s): GLUCAP in the last 72 hours.  Wt Readings from Last 3 Encounters:  05/08/16 66.407 kg (146 lb 6.4 oz)  04/06/16 73.6 kg (162 lb 4.1 oz)  01/22/16 74.9 kg (165 lb 2 oz)    Physical Exam:  BP 95/57 mmHg  Pulse 54  Temp(Src) 98 F (36.7 C) (Oral)  Resp 18  Wt 66.407 kg (146 lb 6.4 oz)  SpO2 99% Constitutional: She appears well-developed.  NAD HENT:  Normocephalic and atraumatic.  Eyes: Conjunctivae and EOM are normal.     Cardiovascular: Normal rate and regular rhythm.    Respiratory: Effort normal and breath sounds normal. No respiratory distress.   GI: Soft. Bowel sounds are normal. She exhibits no distension.  Musculoskeletal: She exhibits no edema or no tenderness.  Neurological: She is Alert and oriented 3 with increased time.  Inconsistently follows commands, although significantly improved  mainly expressive aphasia, with word finding difficulty, with minimal receptive deficits. Dysarthria Left facial weakness Motor: RUE/RLE: 5/5 proximal to distal.  Left upper/lower extremity: 0/5.  Modified Ashworth fluctuates between 0-1/4 left elbow flexion.  Skin: Skin is warm and dry.  Psychiatric:  Smiling, cooperative  Assessment/Plan: 1. Functional deficits secondary to  right basal ganglia hemorrhage which require 3+ hours per day of interdisciplinary therapy in a comprehensive inpatient rehab setting. Physiatrist is providing close team supervision and 24 hour management of active medical problems listed below. Physiatrist and rehab team continue to assess barriers to discharge/monitor patient progress toward functional and medical goals.  Function:  Bathing Bathing position Bathing activity did not occur: Refused Position: Other (comment) (UB seated on EOB, LB in supine)  Bathing parts Body parts bathed by patient: Chest, Abdomen, Left arm, Right upper leg, Left upper leg, Right lower leg, Front perineal area, Buttocks Body parts bathed by helper: Left lower leg, Back, Right arm  Bathing assist Assist Level:  (min A)      Upper Body Dressing/Undressing Upper body dressing Upper body dressing/undressing activity did not occur: Refused What is the patient wearing?: Pull over shirt/dress, Bra Bra - Perfomed by patient: Thread/unthread right bra strap Bra - Perfomed by helper: Thread/unthread left bra strap, Hook/unhook bra (pull down sports bra) Pull over shirt/dress - Perfomed by patient: Thread/unthread right sleeve, Put head through opening, Pull shirt over trunk, Thread/unthread left sleeve Pull over shirt/dress - Perfomed by helper: Thread/unthread left sleeve        Upper body assist Assist Level: Touching or steadying assistance(Pt > 75%)      Lower Body Dressing/Undressing Lower body dressing Lower body dressing/undressing activity did not occur: Refused What is the patient wearing?: Pants, Non-skid slipper socks     Pants- Performed by patient: Thread/unthread right pants leg, Pull pants up/down Pants- Performed by  helper: Thread/unthread left pants leg   Non-skid slipper socks- Performed by helper: Don/doff right sock, Don/doff left sock               TED Hose - Performed by helper: Don/doff right TED hose, Don/doff left TED hose   Lower body assist Assist for lower body dressing:  (max a)      Toileting Toileting Toileting activity did not occur: No continent bowel/bladder event Toileting steps completed by patient: Adjust clothing prior to toileting, Performs perineal hygiene Toileting steps completed by helper: Adjust clothing after toileting Toileting Assistive Devices: Grab bar or rail  Toileting assist Assist level: Touching or steadying assistance (Pt.75%)   Transfers Chair/bed transfer   Chair/bed transfer method: Stand pivot Chair/bed transfer assist level: Touching or steadying assistance (Pt > 75%) Chair/bed transfer assistive device: Armrests     Locomotion Ambulation Ambulation activity did not occur: Safety/medical concerns   Max distance: 53 Assist level: Moderate assist (Pt 50 - 74%)   Wheelchair   Type: Manual Max wheelchair distance: 200 Assist Level: No help, No cues, assistive device, takes more than reasonable amount of time  Cognition Comprehension Comprehension assist level: Follows complex conversation/direction with extra time/assistive device  Expression Expression assist level: Expresses basic 50 - 74% of the time/requires cueing 25 - 49% of the time. Needs to repeat parts of sentences.  Social Interaction Social Interaction assist level: Interacts appropriately with others with medication or extra time (anti-anxiety, antidepressant).  Problem Solving Problem solving assist level: Solves basic 75 - 89% of the time/requires cueing 10 - 24% of the time  Memory Memory assist level: Recognizes or recalls 75 - 89% of the time/requires cueing 10 - 24% of the time     Medical Problem List and Plan: 1.Left hemiplegia, aphasia, dysphagia secondary to right basal ganglia hemorrhage secondary to hypertensive crisis on 04/01/16.   D/C today  Will see pt for transitional care management in 1-2 post-discharge  PRAFO for left lower extremity and splints for left upper extremity  ordered  Fluoxetine 10 started on 5/1, Increased to 20 on 5/8 2. DVT Prophylaxis/Anticoagulation: Subcutaneous heparin for DVT prophylaxis  3. Pain Management/migraine headaches: Fioricet for headaches and Robaxin. 4. Dysphagia. #2 thin liquid diet, Advanced to dysphagia 3 with thin liquids. Speech therapy follow-up. Will continue to advance diet as tolerated  Remeron changed to Marinol on 5/12, increased on 5/16.   Intake improving. 5. Neuropsych: This patient is not capable of making decisions on her own behalf. 6. Skin/Wound Care: Routine skin checks 7. Fluids/Electrolytes/Nutrition: Routine I&O   BMP within acceptable range on 5/19 8. Hypertension now with hypotension,   Plendil 10 mg daily DC'd on 5/15  Hydralazine 10 mg 3 times a day, DC'd on 5/10   Lisinopril 20 mg twice a day, increased to 40 mg on 4/28, changed to 40 mg daily on 5/11, decreased to 20 mg daily on 5/12, decreased to 5 mg on 5/15, decreased to 2.5 on [id number]  Bysystolic 20 mg every 12 hours, decreased to 5 mg on 5/9  Will cont to monitor, patient stable and asymptomatica at this point. Due to questionable left ventricular hypertrophy, will continue low dose statin and beta blocker. 9. Hyperlipidemia. Lipitor 10. Spasticity: Controlled  Baclofen 5 mg on 4/27, DC'd on 5/10, restarted on 5/22. Will monitor for signs of somnolence. 50. Thrush:   Treated with Diflucan, [place] on 5/3 12. AKI  Creatinine 1.01 5/19, improving.   -continue to encourage fluids 13. Right ear  itching: Resolved  Treated with Debrox  LOS (Days) 30 A FACE TO FACE EVALUATION WAS PERFORMED  Ankit [name] 05/08/2016 9:04 AM

## 2016-05-08 NOTE — Progress Notes (Signed)
Occupational Therapy Session Note  Patient Details  Name: Abigail Campos MRN: [id number] Date of Birth: 02/28/1971  Today's Date: 05/08/2016 OT Individual Time:  - 0800-0845  (45 min)       Short Term Goals: Week 1:  OT Short Term Goal 1 (Week 1): Pt will participate in 5 minutes of self care tasks with 2 breaks or less in order to increase alertness and endurance for functional tasks. OT Short Term Goal 1 - Progress (Week 1): Progressing toward goal OT Short Term Goal 2 (Week 1): Pt will visually track to the L to locate 3 items during OT intervention with min verbal cues. OT Short Term Goal 2 - Progress (Week 1): Met OT Short Term Goal 3 (Week 1): Pt will perform transfer with one person to toilet in order to decrease level of assist needed with functional transfers.  OT Short Term Goal 3 - Progress (Week 1): Met Week 2:  OT Short Term Goal 1 (Week 2): Pt will participate in 3 minutes of self care task with 2 rest breaks or less in order to increase alertness and endurance for functional tasks.  OT Short Term Goal 1 - Progress (Week 2): Progressing toward goal OT Short Term Goal 2 (Week 2): Pt will perform UB dressing with mod A in order to decrease level of assist for self care. OT Short Term Goal 2 - Progress (Week 2): Met OT Short Term Goal 3 (Week 2): Pt will perform LB dressing with max A in order to decrease level of assist for self care.  OT Short Term Goal 3 - Progress (Week 2): Progressing toward goal OT Short Term Goal 4 (Week 2): Caregiver will assist pt with L UE self ROM HEP in order to ensure correct exercise performed with use of paper handout in order to demonstrate knowledge of proper technique.  OT Short Term Goal 4 - Progress (Week 2): Not met Week 3:  OT Short Term Goal 1 (Week 3): Pt will locate and maintain L UE during self care tasks for safety awareness with mod verbal cues.  OT Short Term Goal 1 - Progress (Week 3): Not met OT Short Term Goal 2 (Week 3): Pt  will participate in 3 minutes of self care tasks with 2 rest breaks or less in order to increase alertness and endurance for functional tasks.  OT Short Term Goal 2 - Progress (Week 3): Met OT Short Term Goal 3 (Week 3): Pt will perform toileting with max A in order to decrease level of assist with functional task.  OT Short Term Goal 3 - Progress (Week 3): Met Week 4:  OT Short Term Goal 1 (Week 4): STGs=LTGs secondary to upcoming discharge  Skilled Therapeutic Interventions/Progress Updates:    Pt lying in bed.  Vonna Kotyk in room asleep.  Pt went from supine to EOB with min assist >wc >toilet with stand pivot and min assist for balance.   Pt performed stand pivot transfer with use of grab bar onto standard commode with mod A. Pt able to perform clothing management  and hygiene with proper assist for balance. Pt then returned to wheelchair and transferred from wheelchai.  perfomred self ROM.  Sensation in LUE in absent.  Went over safety with monitoring arm when around hot items.  Informed RN of  Area on left [facility] that has sore from splint.   Hand over hand assist to utilize L UE in functional tasks. Pt remained in wheelchair at end of session  with call bell and all needed items within reach upon exiting the room.   Therapy Documentation Precautions:  Precautions Precautions: Fall Precaution Comments: dense L hemi, impulsive Restrictions Weight Bearing Restrictions: No    Vital Signs: Therapy Vitals Temp: 98 F (36.7 C) Temp Source: Oral Pulse Rate: (!) 54 Resp: 18 BP: (!) 95/57 mmHg Patient Position (if appropriate): Lying Oxygen Therapy SpO2: 99 % O2 Device: Not Delivered Pain:  none            See Function Navigator for Current Functional Status.   Therapy/Group: Individual Therapy  Lisa Roca 05/08/2016, 7:57 AM

## 2016-05-08 NOTE — Progress Notes (Signed)
Patient discharged home.  Left floor via wheelchair, escorted by nursing staff and spouse.  Patient and spouse verbalized understanding of discharge instructions as given by Marlowe Shores, PA.  All patient belongings sent with patient, including DME.  Appears to be in no immediate distress at this time.  Brita Romp, RN

## 2016-05-12 ENCOUNTER — Telehealth: Payer: Self-pay

## 2016-05-12 NOTE — Telephone Encounter (Signed)
1. Are you/is patient experiencing any problems since coming home? Are there any questions regarding any aspect of care? 2. Are there any questions regarding medications administration/dosing? Are meds being taken as prescribed? Patient should review meds with caller to confirm 3. Have there been any falls? 4. Has Home Health been to the house and/or have they contacted you? If not, have you tried to contact them? Can we help you contact them? 5. Are bowels and bladder emptying properly? Are there any unexpected incontinence issues? If applicable, is patient following bowel/bladder programs? 6. Any fevers, problems with breathing, unexpected pain? 7. Are there any skin problems or new areas of breakdown? 8. Has the patient/family member arranged specialty MD follow up (ie cardiology/neurology/renal/surgical/etc)?  Can we help arrange? 9. Does the patient need any other services or support that we can help arrange? 10. Are caregivers following through as expected in assisting the patient? 11. Has the patient quit smoking, drinking alcohol, or using drugs as recommended?  

## 2016-05-15 ENCOUNTER — Encounter: Payer: Self-pay | Admitting: Physical Medicine & Rehabilitation

## 2016-05-15 ENCOUNTER — Telehealth: Payer: Self-pay | Admitting: *Deleted

## 2016-05-15 ENCOUNTER — Encounter
Payer: Managed Care, Other (non HMO) | Attending: Physical Medicine & Rehabilitation | Admitting: Physical Medicine & Rehabilitation

## 2016-05-15 VITALS — BP 108/66 | HR 60 | Resp 14

## 2016-05-15 DIAGNOSIS — Z833 Family history of diabetes mellitus: Secondary | ICD-10-CM | POA: Insufficient documentation

## 2016-05-15 DIAGNOSIS — R42 Dizziness and giddiness: Secondary | ICD-10-CM | POA: Insufficient documentation

## 2016-05-15 DIAGNOSIS — Z823 Family history of stroke: Secondary | ICD-10-CM | POA: Insufficient documentation

## 2016-05-15 DIAGNOSIS — G43909 Migraine, unspecified, not intractable, without status migrainosus: Secondary | ICD-10-CM | POA: Diagnosis not present

## 2016-05-15 DIAGNOSIS — G43009 Migraine without aura, not intractable, without status migrainosus: Secondary | ICD-10-CM

## 2016-05-15 DIAGNOSIS — R0989 Other specified symptoms and signs involving the circulatory and respiratory systems: Secondary | ICD-10-CM | POA: Diagnosis not present

## 2016-05-15 DIAGNOSIS — I69959 Hemiplegia and hemiparesis following unspecified cerebrovascular disease affecting unspecified side: Secondary | ICD-10-CM | POA: Diagnosis not present

## 2016-05-15 DIAGNOSIS — M6249 Contracture of muscle, multiple sites: Secondary | ICD-10-CM

## 2016-05-15 DIAGNOSIS — Z8249 Family history of ischemic heart disease and other diseases of the circulatory system: Secondary | ICD-10-CM | POA: Insufficient documentation

## 2016-05-15 DIAGNOSIS — I69391 Dysphagia following cerebral infarction: Secondary | ICD-10-CM

## 2016-05-15 DIAGNOSIS — M62838 Other muscle spasm: Secondary | ICD-10-CM

## 2016-05-15 DIAGNOSIS — G8191 Hemiplegia, unspecified affecting right dominant side: Secondary | ICD-10-CM | POA: Insufficient documentation

## 2016-05-15 DIAGNOSIS — R030 Elevated blood-pressure reading, without diagnosis of hypertension: Secondary | ICD-10-CM

## 2016-05-15 DIAGNOSIS — R200 Anesthesia of skin: Secondary | ICD-10-CM | POA: Insufficient documentation

## 2016-05-15 DIAGNOSIS — I610 Nontraumatic intracerebral hemorrhage in hemisphere, subcortical: Secondary | ICD-10-CM | POA: Diagnosis not present

## 2016-05-15 DIAGNOSIS — I10 Essential (primary) hypertension: Secondary | ICD-10-CM | POA: Diagnosis not present

## 2016-05-15 DIAGNOSIS — F419 Anxiety disorder, unspecified: Secondary | ICD-10-CM | POA: Insufficient documentation

## 2016-05-15 DIAGNOSIS — R252 Cramp and spasm: Secondary | ICD-10-CM | POA: Insufficient documentation

## 2016-05-15 DIAGNOSIS — I517 Cardiomegaly: Secondary | ICD-10-CM

## 2016-05-15 DIAGNOSIS — I69320 Aphasia following cerebral infarction: Secondary | ICD-10-CM

## 2016-05-15 DIAGNOSIS — R630 Anorexia: Secondary | ICD-10-CM | POA: Diagnosis not present

## 2016-05-15 DIAGNOSIS — I69359 Hemiplegia and hemiparesis following cerebral infarction affecting unspecified side: Secondary | ICD-10-CM

## 2016-05-15 NOTE — Telephone Encounter (Signed)
Abigail Campos, from [facility] left message asking for verbal orders to initiate home health care 2week1 followed by3week4 followed by 2week 4 to cover patients entire certification period. This will be for PT to work on strength, balance, ambulation, transfers, and so forth. Called back and gave verbal orders per office protocol

## 2016-05-15 NOTE — Progress Notes (Addendum)
Subjective:    Patient ID: Abigail Campos, female    DOB: 1971/03/18, 45 y.o.   MRN: [id number]  HPI  45 year old right-handed female with history of hypertension, migraine headaches presents for transitional care management after being discharged from the hospital after right basal ganglia hemorrhage.  DATE OF ADMISSION:  04/07/2016 DATE OF DISCHARGE:  05/08/2016 Since discharge, she states she is doing well and feels better.  The front of her PRAFO broke.  She also inquires about sling for her LUE.  She is not sure if she has an appointment with Neurology.  She has an appointment to see her PCP next Tues.  She is taking all of her medications.  She has been advanced to a regular diet, but is not eating much.    Therapies: 2/day PT/OT/SLP Mobility: Wheelchair at all times DME: Bedside commode  Pain Inventory Average Pain 0 Pain Right Now 0 My pain is no pain  In the last 24 hours, has pain interfered with the following? General activity 0 Relation with others 0 Enjoyment of life 0 What TIME of day is your pain at its worst? no pain Sleep (in general) Good  Pain is worse with: no pain Pain improves with: no pain Relief from Meds: no pain  Mobility walk with assistance use a walker ability to climb steps?  no do you drive?  no use a wheelchair needs help with transfers transfers alone  Function disabled: date disabled 04/01/2016 I need assistance with the following:  toileting  Neuro/Psych numbness tingling trouble walking spasms dizziness anxiety  Prior Studies hospital f/u  Physicians involved in your care hospital f/u   Family History  Problem Relation Age of Onset  . Diabetes Mother   . Hypertension Mother   . Glaucoma Mother   . Heart disease      2 uncles died 03/31/2011 from CAD-CHF  . Stroke Maternal Grandfather   . Colon cancer Mother     mother age 102  . Diabetes Other     siblings  . Hypertension Other     siblings   Social History    Social History  . Marital Status: Married    Spouse Name: N/A  . Number of Children: 2  . Years of Education: N/A   Occupational History  . TEACHER    Social History Main Topics  . Smoking status: Never Smoker   . Smokeless tobacco: Never Used  . Alcohol Use: No  . Drug Use: No  . Sexual Activity: Not Asked   Other Topics Concern  . None   Social History Narrative   Past Surgical History  Procedure Laterality Date  . Inguinal hernia repair      03-31-03  . Cesarean section      [id number]  . Abdominal hysterectomy  11-14-07    no oophorectomy per surgical report   Past Medical History  Diagnosis Date  . Headache(784.0)     developed migraines after 2nd pregnancy, had a negative CT of the head  . Hypertension   . Essential hypertension 10/19/2007    03-30-2010: metoprolol changed to bystolic (was not feeling well on it, no specific allergy or reaction)     BP 108/66 mmHg  Pulse 60  Resp 14  SpO2 97%  Opioid Risk Score:   Fall Risk Score:  `1  Depression screen PHQ 2/9  Depression screen [facility] 2/9 05/15/2016 09/25/2015 07/17/2015  Decreased Interest 0 0 0  Down, Depressed, Hopeless 1 0 0  PHQ -  2 Score 1 0 0  Altered sleeping 1 - -  Tired, decreased energy 3 - -  Change in appetite 0 - -  Feeling bad or failure about yourself  0 - -  Trouble concentrating 0 - -  Moving slowly or fidgety/restless 0 - -  Suicidal thoughts 0 - -  PHQ-9 Score 5 - -   Review of Systems  Constitutional: Positive for appetite change.  Gastrointestinal: Positive for nausea and constipation.  Neurological: Positive for facial asymmetry and weakness. Negative for numbness.  All other systems reviewed and are negative.     Objective:   Physical Exam Constitutional: She appears well-developed.  NAD HENT:  Normocephalic and atraumatic.   Eyes: Conjunctivae and EOM are normal.     Cardiovascular: Normal rate and regular rhythm.    Respiratory: Effort normal and breath sounds normal. No  respiratory distress.   GI: Soft. Bowel sounds are normal. She exhibits no distension.  Musculoskeletal: She exhibits no edema or no tenderness.  Neurological: She is Alert and oriented 3.  Expressive aphasia Dysarthria Left facial weakness Motor: RUE/RLE: 5/5 proximal to distal.   Left upper/lower extremity: 0/5.  Modified Ashworth fluctuates between 0/4 left elbow flexion.  Skin: Skin is warm and dry.  Psychiatric:  Smiling, cooperative    Assessment & Plan:  45 year old right-handed female with history of hypertension, migraine headaches presents for transitional care management after being discharged from the hospital after right basal ganglia hemorrhage.   1. Hemiplegia late effect of right basal ganglia hemorrhage  Cont therapies  Cont meds  Follow up Neurology  Cont braces  Cont Fluoxetine 20  2. Migraines  Only occasional  Relieved with Meds  3. Poor appetite  Cont to encourage PO intake  4. Labile BP  Currently WNL  Cont lisinopril 2.5 on [id number], Bysystolic 5 mg on 5/9 due to questionable left ventricular hypertrophy  5. Spasticity  Wean Baclofen 5 mg to daily for 3 days, then may d/c.  May resume if tone returns  Meds reviewed Referrals reviewed All questions answered

## 2016-05-15 NOTE — Patient Instructions (Signed)
Follow up with Neurology, Dr. Erlinda Hong  Wean Baclofen to 5mg  daily for 3 days, then D/c.  May resume if spasticity returns

## 2016-05-18 ENCOUNTER — Ambulatory Visit (INDEPENDENT_AMBULATORY_CARE_PROVIDER_SITE_OTHER): Payer: Managed Care, Other (non HMO) | Admitting: Internal Medicine

## 2016-05-18 ENCOUNTER — Encounter: Payer: Self-pay | Admitting: Internal Medicine

## 2016-05-18 ENCOUNTER — Telehealth: Payer: Self-pay

## 2016-05-18 VITALS — BP 126/74 | HR 53 | Temp 98.2°F | Ht <= 58 in

## 2016-05-18 DIAGNOSIS — I69320 Aphasia following cerebral infarction: Secondary | ICD-10-CM | POA: Diagnosis not present

## 2016-05-18 DIAGNOSIS — I610 Nontraumatic intracerebral hemorrhage in hemisphere, subcortical: Secondary | ICD-10-CM

## 2016-05-18 DIAGNOSIS — F32A Depression, unspecified: Secondary | ICD-10-CM

## 2016-05-18 DIAGNOSIS — F329 Major depressive disorder, single episode, unspecified: Secondary | ICD-10-CM

## 2016-05-18 DIAGNOSIS — I10 Essential (primary) hypertension: Secondary | ICD-10-CM

## 2016-05-18 DIAGNOSIS — E785 Hyperlipidemia, unspecified: Secondary | ICD-10-CM | POA: Diagnosis not present

## 2016-05-18 DIAGNOSIS — I69359 Hemiplegia and hemiparesis following cerebral infarction affecting unspecified side: Secondary | ICD-10-CM

## 2016-05-18 LAB — BASIC METABOLIC PANEL
BUN: 12 mg/dL (ref 6–23)
CO2: 31 mEq/L (ref 19–32)
Calcium: 9.3 mg/dL (ref 8.4–10.5)
Chloride: 101 mEq/L (ref 96–112)
Creatinine, Ser: 0.8 mg/dL (ref 0.40–1.20)
GFR: 100.9 mL/min (ref >60.00)
Glucose, Bld: 100 mg/dL — ABNORMAL HIGH (ref 70–99)
Potassium: 3.4 mEq/L — ABNORMAL LOW (ref 3.5–5.1)
Sodium: 136 mEq/L (ref 135–145)

## 2016-05-18 LAB — LIPID PANEL
Cholesterol: 102 mg/dL (ref 0–200)
HDL: 30.6 mg/dL — ABNORMAL LOW (ref >39.00)
LDL Cholesterol: 52 mg/dL (ref 0–99)
NonHDL: 71.88
Total CHOL/HDL Ratio: 3
Triglycerides: 98 mg/dL (ref 0.0–149.0)
VLDL: 19.6 mg/dL (ref 0.0–40.0)

## 2016-05-18 LAB — CBC WITH DIFFERENTIAL/PLATELET
Basophils Absolute: 0 10*3/uL (ref 0.0–0.1)
Basophils Relative: 0.5 % (ref 0.0–3.0)
Eosinophils Absolute: 0.1 10*3/uL (ref 0.0–0.7)
Eosinophils Relative: 1.2 % (ref 0.0–5.0)
HCT: 35.6 % — ABNORMAL LOW (ref 36.0–46.0)
Hemoglobin: 11.6 g/dL — ABNORMAL LOW (ref 12.0–15.0)
Lymphocytes Relative: 26.1 % (ref 12.0–46.0)
Lymphs Abs: 1.3 10*3/uL (ref 0.7–4.0)
MCHC: 32.7 g/dL (ref 30.0–36.0)
MCV: 91.3 fl (ref 78.0–100.0)
Monocytes Absolute: 0.5 10*3/uL (ref 0.1–1.0)
Monocytes Relative: 9.5 % (ref 3.0–12.0)
Neutro Abs: 3.1 10*3/uL (ref 1.4–7.7)
Neutrophils Relative %: 62.7 % (ref 43.0–77.0)
Platelets: 176 10*3/uL (ref 150.0–400.0)
RBC: 3.9 Mil/uL (ref 3.87–5.11)
RDW: 14.4 % (ref 11.5–15.5)
WBC: 5 10*3/uL (ref 4.0–10.5)

## 2016-05-18 LAB — AST: AST: 25 U/L (ref 0–37)

## 2016-05-18 LAB — ALT: ALT: 50 U/L — ABNORMAL HIGH (ref 0–35)

## 2016-05-18 NOTE — Telephone Encounter (Signed)
That would be appropriate.  Thank you.

## 2016-05-18 NOTE — Progress Notes (Signed)
Pre visit review using our clinic review tool, if applicable. No additional management support is needed unless otherwise documented below in the visit note. 

## 2016-05-18 NOTE — Assessment & Plan Note (Signed)
Stroke: Recovering from aI stroke and ICB,  working hard w/ home PT. Plan is to control CV RF HTN: Seems well-controlled at this time. Continue Lasix, lisinopril, bystolic. Check a BMP and CBC, hemoglobin was a slightly low at the hospital. Hyperlipidemia: On Lipitor, she is not fasting but will go ahead and check a lipid panel, AST ALT Prediabetes: new dx done at the hospital, recheck a A1c on RTC Depression: Understandably depressed due to recent events, continue Prozac, I asked the patient if we could increase her medication (she was tearful today) , she strongly declined. Reassess on RTC GERD: Continue medications, reassess on RTC RTC 3 months

## 2016-05-18 NOTE — Progress Notes (Signed)
Subjective:    Patient ID: Abigail Campos, female    DOB: [birth year], 45 y.o.   MRN: [id number]  DOS:  05/18/2016 Type of visit - description : f/u Interval history: Admitted to the hospital after a stroke, subsequently sent to rehabilitation and now is back home, living with his mother. She is determined to work hard on her physical therapy, the patient and the mother reports some improvement of her speech. BPs are checked frequently by the physical therapies and are always normal. Saw neurology 05/15/2016, note reviewed,  changes were made.    Review of Systems Denies chest pain or difficulty breathing No nausea, vomiting, diarrhea When asked about depression, she states she is okay, on SSRIs  Past Medical History  Diagnosis Date  . Headache(784.0)     developed migraines after 2nd pregnancy, had a negative CT of the head  . Hypertension   . Essential hypertension 10/19/2007    03-2010: metoprolol changed to bystolic (was not feeling well on it, no specific allergy or reaction)      Past Surgical History  Procedure Laterality Date  . Inguinal hernia repair      2004  . Cesarean section      [id number]  . Abdominal hysterectomy  11-14-07    no oophorectomy per surgical report    Social History   Social History  . Marital Status: Married    Spouse Name: N/A  . Number of Children: 2  . Years of Education: N/A   Occupational History  . TEACHER    Social History Main Topics  . Smoking status: Never Smoker   . Smokeless tobacco: Never Used  . Alcohol Use: No  . Drug Use: No  . Sexual Activity: Not on file   Other Topics Concern  . Not on file   Social History Narrative   Used to leve independently, stroke 03-2016, d/c from rehab to his mother's house 04-2016        Medication List       This list is accurate as of: 05/18/16  5:08 PM.  Always use your most recent med list.               atorvastatin 20 MG tablet  Commonly known as:  LIPITOR  Take 1  tablet (20 mg total) by mouth daily at 6 PM.     baclofen 10 MG tablet  Commonly known as:  LIORESAL  Take 0.5 tablets (5 mg total) by mouth 3 (three) times daily.     butalbital-acetaminophen-caffeine 50-325-40 MG tablet  Commonly known as:  FIORICET, ESGIC  Take 1 tablet by mouth every 4 (four) hours as needed for headache.     famotidine 20 MG tablet  Commonly known as:  PEPCID  One at bedtime     FLUoxetine 20 MG capsule  Commonly known as:  PROZAC  Take 1 capsule (20 mg total) by mouth daily.     furosemide 20 MG tablet  Commonly known as:  LASIX  Take 20 mg by mouth daily.     lisinopril 2.5 MG tablet  Commonly known as:  PRINIVIL,ZESTRIL  Take 1 tablet (2.5 mg total) by mouth daily.     nebivolol 5 MG tablet  Commonly known as:  BYSTOLIC  Take 1 tablet (5 mg total) by mouth every 12 (twelve) hours.     pantoprazole 40 MG tablet  Commonly known as:  PROTONIX  Take 1 tablet (40 mg total) by mouth daily. Take 30-60 min  before first meal of the day     VITAMIN D PO  Take 1 tablet by mouth daily.           Objective:   Physical Exam BP 126/74 mmHg  Pulse 53  Temp(Src) 98.2 F (36.8 C) (Oral)  Ht 4\' 9"  (1.448 m)  Wt   SpO2 97% General:   Well developed, well nourished . NAD.  HEENT:  Normocephalic . Face symmetric, atraumatic Lungs:  CTA B Normal respiratory effort, no intercostal retractions, no accessory muscle use. Heart: RRR,  no murmur.  No pretibial edema bilaterally  Skin: Not pale. Not jaundice Neurologic:  alert & oriented X3.  No formal neurological exam was done today, saw neurology 05/15/2016 Psych--  Cooperative with normal attention span and concentration.  Behavior appropriate. Tearful during portions of the visit      Assessment & Plan:   Assessment > Prediabetes: A1c 6.0 (April 2017)  HTN Depression Stroke: ICH, right basilar ganglia due to HTN emergency, 03-2016: s/p encephalopathy, + dysphagia, aphasia,  dysarthria Headaches , migraines dx after 2nd pregnancy, (-) CT head 2014 and 2015 Insomnia  Cough, persisting, saw Dr. Melvyn Novas 11-2015  PLAN: Stroke: Recovering from aI stroke and ICB,  working hard w/ home PT. Plan is to control CV RF HTN: Seems well-controlled at this time. Continue Lasix, lisinopril, bystolic. Check a BMP and CBC, hemoglobin was a slightly low at the hospital. Hyperlipidemia: On Lipitor, she is not fasting but will go ahead and check a lipid panel, AST ALT Prediabetes: new dx done at the hospital, recheck a A1c on RTC Depression: Understandably depressed due to recent events, continue Prozac, I asked the patient if we could increase her medication (she was tearful today) , she strongly declined. Reassess on RTC GERD: Continue medications, reassess on RTC RTC 3 months  Today, I spent more than  25  min with the patient: >50% of the time counseling regards stroke, depression, need to control cardiovascular risk factors, reviewing the chart.

## 2016-05-18 NOTE — Telephone Encounter (Signed)
Order placed

## 2016-05-18 NOTE — Telephone Encounter (Signed)
Abigail Campos- PT from [facility]- is calling to request an rx for a hemi walker. She would like an rx faxed to [place] at [phone] to help work on ambulation at home. Use dx code of basal ganglia hemorrhage. Please advise.

## 2016-05-18 NOTE — Telephone Encounter (Signed)
unable to contact pt.

## 2016-05-18 NOTE — Patient Instructions (Signed)
GO TO THE LAB : Get the blood work     GO TO THE FRONT DESK Schedule your next appointment for a  Check up in 3 months     Check the  blood pressure   daily Be sure your blood pressure is between 110/65 and  130/85. If it is consistently higher or lower, let me know

## 2016-05-19 ENCOUNTER — Telehealth: Payer: Self-pay | Admitting: *Deleted

## 2016-05-19 NOTE — Telephone Encounter (Signed)
OT called needing verbal orders for homehealth OT. Patient evaluated last week. OT noticed that patient had a small subluxation in the left shoulder and was wondering if it was present while patient was in inpatient rehab.  OT is recommending a 'bobbath' sling to help with the subluxation.  She feels it should be covered with a script.  If you agree with OT's recommendation they are asking for a hand written script to be sent to fax:[phone]. Verbal orders were given per office protocol.

## 2016-05-20 ENCOUNTER — Encounter: Payer: Self-pay | Admitting: Family Medicine

## 2016-05-20 ENCOUNTER — Telehealth: Payer: Self-pay | Admitting: Internal Medicine

## 2016-05-20 ENCOUNTER — Ambulatory Visit (INDEPENDENT_AMBULATORY_CARE_PROVIDER_SITE_OTHER): Payer: Managed Care, Other (non HMO) | Admitting: Family Medicine

## 2016-05-20 VITALS — BP 110/75 | HR 55 | Temp 98.2°F | Ht 59.0 in

## 2016-05-20 DIAGNOSIS — I69359 Hemiplegia and hemiparesis following cerebral infarction affecting unspecified side: Secondary | ICD-10-CM

## 2016-05-20 DIAGNOSIS — N3000 Acute cystitis without hematuria: Secondary | ICD-10-CM | POA: Diagnosis not present

## 2016-05-20 DIAGNOSIS — R109 Unspecified abdominal pain: Secondary | ICD-10-CM | POA: Diagnosis not present

## 2016-05-20 LAB — POCT URINALYSIS DIPSTICK
Bilirubin, UA: NEGATIVE
Blood, UA: NEGATIVE
Glucose, UA: NEGATIVE
Ketones, UA: NEGATIVE
Leukocytes, UA: NEGATIVE
Nitrite, UA: NEGATIVE
Protein, UA: NEGATIVE
Spec Grav, UA: >=1.03
Urobilinogen, UA: NEGATIVE
pH, UA: 6

## 2016-05-20 NOTE — Telephone Encounter (Signed)
Noted. Patient scheduled to see Dr. Lorelei Pont at 1:00 PM today.

## 2016-05-20 NOTE — Telephone Encounter (Signed)
Patient Name: Abigail Campos  DOB: 1971-01-10    Initial Comment Caller states having severe abdominal pain.   Nurse Assessment  Nurse: Leilani Merl, RN, Heather Date/Time (Eastern Time): 05/20/2016 10:00:23 AM  Confirm and document reason for call. If symptomatic, describe symptoms. You must click the next button to save text entered. ---Caller states having severe abdominal pain in her lower stomach that started last night.  Has the patient traveled out of the country within the last 30 days? ---Not Applicable  Does the patient have any new or worsening symptoms? ---Yes  Will a triage be completed? ---Yes  Related visit to physician within the last 2 weeks? ---No  Does the PT have any chronic conditions? (i.e. diabetes, asthma, etc.) ---Yes  List chronic conditions. ---stroke  Is the patient pregnant or possibly pregnant? (Ask all females between the ages of 24-55) ---No  Is this a behavioral health or substance abuse call? ---No     Guidelines    Guideline Title Affirmed Question Affirmed Notes  Abdominal Pain - Female [1] MILD-MODERATE pain AND [2] constant AND [3] present > 2 hours    Final Disposition User   See Physician within 4 Hours (or PCP triage) Leilani Merl, RN, Heather    Comments  Appt with Dr. Lorelei Pont at 1 pm today.   Referrals  REFERRED TO PCP OFFICE   Disagree/Comply: Comply

## 2016-05-20 NOTE — Patient Instructions (Signed)
I will be in touch with your urine culture asap Try to increase your water intake some- it does appear that you are a bit dehydrated.   I will touch base with your neurologist about your bladder in case they want any other testing  Remember if you are not able to pass urine or if you have pain in your bladder please seek care!

## 2016-05-20 NOTE — Progress Notes (Signed)
Pre visit review using our clinic review tool, if applicable. No additional management support is needed unless otherwise documented below in the visit note. 

## 2016-05-20 NOTE — Telephone Encounter (Signed)
Patient stated that she and Dr. Posey Pronto spoke about getting a prescription for a new boot.  Patient called place where she was going to get it, but they need a prescription.  Please call patient at [phone].

## 2016-05-20 NOTE — Telephone Encounter (Signed)
FYI

## 2016-05-20 NOTE — Telephone Encounter (Signed)
Final Disposition User   See Physician within 4 Hours (or PCP triage) Leilani Merl, RN, Heather    Comments  Appt with Dr. Lorelei Pont at 1 pm today.   Referrals  REFERRED TO PCP OFFICE   Disagree/Comply: Comply

## 2016-05-20 NOTE — Progress Notes (Addendum)
[place] [facility] [address], [place], [place] [address] [phone]  Date:  05/20/2016   Name:  Abigail Campos   DOB:  July 26, 1971   MRN:  [id number]  PCP:  Kathlene November, MD    Chief Complaint: Abdominal Cramping   History of Present Illness:  Abigail Campos is a 45 y.o. very pleasant female patient who presents with the following:  She had a basal ganglia hemorrhage/ CVA in April of this year. She has left sided weakness as a result  She is wearing a left ankle and wrist brace- she is able to walk some with assitance during PT but otherwise is in a WC. Her mother is here with her today and is her main caretaker  She is here today with concern about a possible UTI.  Right after her stoke she was using a catheter, but has been urinating on her own since then.   She will only urinate twice a day- this has been the case for a couple of weeks.  This am she noted some lower abd pain which is now resolved, describes as cramping She has not noted any hematuria or any blood in the urine, no dysuria, no urinary retention It seems that her main concern is that she is urinating so infrequently and they wonder if this may be a symptom of something wrong.  She is s/p hysterectomy No vomiting, her appetite is ok.  Patient Active Problem List   Diagnosis Date Noted  . Ear itching   . Poor fluid intake   . Bradycardia, drug induced   . Poor nutrition   . Muscle spasticity   . Obesity (BMI 30.0-34.9) 04/08/2016  . Hyperlipidemia 04/08/2016  . Basal ganglia hemorrhage ([place]) 04/08/2016  . Aphasia as late effect of cerebrovascular accident   . Dysphagia as late effect of cerebrovascular disease   . Migraine with aura and without status migrainosus, not intractable   . HLD (hyperlipidemia)   . Benign essential HTN   . Gait disturbance, post-stroke   . Hemiplegia, post-stroke ([place])   . Hypokalemia   . Dysphagia, post-stroke   .  Aphasia, post-stroke   . Bradycardia   . Headache, migraine   . Dysarthria, post-stroke   . Encephalopathy acute 04/02/2016  . ICH (intracerebral hemorrhage) (HCC) - R basal ganglia due to hypertensive emergency 04/01/2016  . Upper airway cough syndrome 12/06/2015  . PCP NOTES >>> 09/25/2015  . Need for hepatitis B vaccination 10/19/2014  . Insomnia 12/28/2012  . Annual physical exam 11/23/2011  . Hemorrhoids 10/13/2011  . Morbid obesity ([place]) 02/26/2011  . CHEST PAIN UNSPECIFIED 03/28/2010  . Essential hypertension 10/19/2007  . Headache(784.0) 05/12/2007    Past Medical History  Diagnosis Date  . Headache(784.0)     developed migraines after 2nd pregnancy, had a negative CT of the head  . Hypertension   . Essential hypertension 10/19/2007    03-2010: metoprolol changed to bystolic (was not feeling well on it, no specific allergy or reaction)      Past Surgical History  Procedure Laterality Date  . Inguinal hernia repair      2004  . Cesarean section      [id number]  . Abdominal hysterectomy  11-14-07    no oophorectomy per surgical report    Social History  Substance Use Topics  . Smoking status: Never Smoker   . Smokeless tobacco: Never Used  . Alcohol Use: No  Family History  Problem Relation Age of Onset  . Diabetes Mother   . Hypertension Mother   . Glaucoma Mother   . Heart disease      2 uncles died 03/28/11 from CAD-CHF  . Stroke Maternal Grandfather   . Colon cancer Mother     mother age 65  . Diabetes Other     siblings  . Hypertension Other     siblings    Allergies  Allergen Reactions  . Bee Venom Anaphylaxis  . Peanut-Containing Drug Products Anaphylaxis  . Hydrocodone Hives    Generalize itching w/o rash  . Ambien [Zolpidem Tartrate] Other (See Comments)    forgetfulness  . Aspirin Swelling    Reports blisters w/ ASA but pt reports is ok w/ Motrin, Advil, naproxen  . Latex Hives    Medication list has been reviewed and  updated.  Current Outpatient Prescriptions on File Prior to Visit  Medication Sig Dispense Refill  . atorvastatin (LIPITOR) 20 MG tablet Take 1 tablet (20 mg total) by mouth daily at 6 PM. 30 tablet 1  . baclofen (LIORESAL) 10 MG tablet Take 0.5 tablets (5 mg total) by mouth 3 (three) times daily. 90 tablet 1  . butalbital-acetaminophen-caffeine (FIORICET, ESGIC) 50-325-40 MG tablet Take 1 tablet by mouth every 4 (four) hours as needed for headache. 60 tablet 0  . Cholecalciferol (VITAMIN D PO) Take 1 tablet by mouth daily.     . famotidine (PEPCID) 20 MG tablet One at bedtime (Patient taking differently: Take 20 mg by mouth at bedtime. ) 30 tablet 2  . FLUoxetine (PROZAC) 20 MG capsule Take 1 capsule (20 mg total) by mouth daily. 30 capsule 3  . furosemide (LASIX) 20 MG tablet Take 20 mg by mouth daily.    Marland Kitchen lisinopril (PRINIVIL,ZESTRIL) 2.5 MG tablet Take 1 tablet (2.5 mg total) by mouth daily. 30 tablet 1  . nebivolol (BYSTOLIC) 5 MG tablet Take 1 tablet (5 mg total) by mouth every 12 (twelve) hours. 30 tablet 1  . pantoprazole (PROTONIX) 40 MG tablet Take 1 tablet (40 mg total) by mouth daily. Take 30-60 min before first meal of the day 30 tablet 2   No current facility-administered medications on file prior to visit.    Review of Systems:  As per HPI- otherwise negative.   Physical Examination: Filed Vitals:   05/20/16 1321  BP: 110/75  Pulse: 55  Temp: 98.2 F (36.8 C)   Filed Vitals:   05/20/16 1321  Height: 4\' 11"  (1.499 m)   There is no weight on file to calculate BMI. Ideal Body Weight: Weight in (lb) to have BMI = 25: 123.5   Pulse Readings from Last 3 Encounters:  05/20/16 55  05/18/16 53  05/15/16 60   GEN: WDWN, NAD, Non-toxic, A & O x 3, looks well, in WC.  She does have some difficulty speaking still and cannot use her left arm or leg HEENT: Atraumatic, Normocephalic. Neck supple. No masses, No LAD. Ears and Nose: No external deformity. CV: RRR, No M/G/R.  No JVD. No thrill. No extra heart sounds. PULM: CTA B, no wheezes, crackles, rhonchi. No retractions. No resp. distress. No accessory muscle use. ABD: S, NT, ND, +BS. No rebound. No HSM.  Benign belly EXTR: No c/c/e NEURO unable to walk due to stroke PSYCH: Normally interactive. Conversant. Not depressed or anxious appearing.  Calm demeanor.   Pt was able to urinate while in the office- large amount  Results for orders placed  or performed in visit on 05/20/16  POCT urinalysis dipstick  Result Value Ref Range   Color, UA yellow    Clarity, UA clear    Glucose, UA negative    Bilirubin, UA negative    Ketones, UA negative    Spec Grav, UA >=1.030    Blood, UA negative    pH, UA 6.0    Protein, UA negative    Urobilinogen, UA negative    Nitrite, UA negative    Leukocytes, UA Negative Negative    Assessment and Plan: Abdominal pain, unspecified abdominal location - Plan: POCT urinalysis dipstick, Urine culture  Hemiplegia, post-stroke ([place])  Reassurance that she does not appear to have a UTI, but I will check a culture to be sure.  Her SG is high so she may be urinating a bit less due to dehydration.  Also consider neurogenic bladder.  Will touch base with Dr. Erlinda Hong about this and see if he wants me to order any testing prior to their next visit Normal renal function on labs 2 days ago  See patient instructions for more details.     Signed Lamar Blinks, MD  Received a reply from Dr. Erlinda Hong and called on 6/9  Thanks for your message. She had right BG ICH in 03/2016. The location usually does not cause urinary retention, but I think it is a good idea to check the post void residue. I saw her UA and Cre are good. She has not made appointment with me yet but I will let my nurse to call her for appointment. Will keep you posted. Thanks   Rosalin Hawking, MD PhD   Urine culture grew 80K of enterococcus Called and LMOM with her husband; she does have a UTI,  I will send in abx for this.  Will use macrobid.  Also we would like to get a post void residual for her and I will arrange this  6/10; Final urine culture showed the following.  Abx that she is taking should take care of this. Called again and no answer-Will send a letter  Results for orders placed or performed in visit on 05/20/16  Urine culture  Result Value Ref Range   Culture ENTEROCOCCUS SPECIES    Colony Count 80,000 COLONIES/ML    Organism ID, Bacteria ENTEROCOCCUS SPECIES       Susceptibility   Enterococcus species -  (no method available)    AMPICILLIN <=2 Sensitive     LEVOFLOXACIN 1 Sensitive     NITROFURANTOIN <=16 Sensitive     VANCOMYCIN 2 Sensitive     TETRACYCLINE >=16 Resistant   POCT urinalysis dipstick  Result Value Ref Range   Color, UA yellow    Clarity, UA clear    Glucose, UA negative    Bilirubin, UA negative    Ketones, UA negative    Spec Grav, UA >=1.030    Blood, UA negative    pH, UA 6.0    Protein, UA negative    Urobilinogen, UA negative    Nitrite, UA negative    Leukocytes, UA Negative Negative

## 2016-05-20 NOTE — Telephone Encounter (Signed)
Mother Dorothe Pea) called stating that patient woke up complaining of severe abdominal pain. Transferred to [facility]. Spoke with Maudie Mercury

## 2016-05-21 ENCOUNTER — Telehealth: Payer: Self-pay

## 2016-05-21 NOTE — Telephone Encounter (Signed)
I believe the question was regarding her broken PRAFO.  If they were unable to get it fixed and she needs a new prescription, we can write for one.  Thanks

## 2016-05-21 NOTE — Telephone Encounter (Addendum)
Unable to leave message.Patient was call this am per Dr.Xu request. Rn call patients cell number which is her home number. Per Dr. Erlinda Hong request pt has not schedule appt. Per referral order pt was call three times to schedule a follow up, and her vm was full. Pt was call by referral staff at [facility] three times and and could not leave a message. Rn call and got the same message.

## 2016-05-21 NOTE — Telephone Encounter (Signed)
Please advise 

## 2016-05-22 MED ORDER — NITROFURANTOIN MONOHYD MACRO 100 MG PO CAPS
100.0000 mg | ORAL_CAPSULE | Freq: Two times a day (BID) | ORAL | Status: DC
Start: 2016-05-22 — End: 2016-06-10

## 2016-05-22 NOTE — Addendum Note (Signed)
Addended by: Lamar Blinks C on: 05/22/2016 01:34 PM   Modules accepted: Orders

## 2016-05-23 ENCOUNTER — Encounter: Payer: Self-pay | Admitting: Family Medicine

## 2016-05-23 LAB — URINE CULTURE: Colony Count: 80000

## 2016-05-27 NOTE — Telephone Encounter (Signed)
Patient schedule with Dr. Erlinda Hong on 06-24-16 for hospital follow up.

## 2016-06-01 ENCOUNTER — Encounter: Payer: Managed Care, Other (non HMO) | Admitting: Internal Medicine

## 2016-06-02 ENCOUNTER — Telehealth: Payer: Self-pay | Admitting: *Deleted

## 2016-06-02 NOTE — Telephone Encounter (Signed)
Gracie, physical therapist from [email or web address]  is asking for an RX for a hemi-walker if you agree. Please fax order [phone] attn: Terri Piedra

## 2016-06-03 NOTE — Telephone Encounter (Signed)
Script written, signed, and faxed per face to face discussion with Dr. Posey Pronto

## 2016-06-05 ENCOUNTER — Other Ambulatory Visit: Payer: Self-pay | Admitting: Family Medicine

## 2016-06-08 ENCOUNTER — Other Ambulatory Visit: Payer: Self-pay | Admitting: Emergency Medicine

## 2016-06-08 DIAGNOSIS — N3000 Acute cystitis without hematuria: Secondary | ICD-10-CM

## 2016-06-08 NOTE — Telephone Encounter (Signed)
Called them back- per pt and her mom they do not need abx, she does not have any symptoms

## 2016-06-08 NOTE — Telephone Encounter (Signed)
Received refill request for Nitrofurantoin from [facility].   Last visit: 05/20/2016 Last refill: 05/22/2016  Is it ok to refill. Please advise. Thanks

## 2016-06-10 ENCOUNTER — Other Ambulatory Visit: Payer: Self-pay | Admitting: Emergency Medicine

## 2016-06-10 ENCOUNTER — Encounter: Payer: Self-pay | Admitting: Physical Medicine & Rehabilitation

## 2016-06-10 ENCOUNTER — Encounter (HOSPITAL_BASED_OUTPATIENT_CLINIC_OR_DEPARTMENT_OTHER): Payer: Managed Care, Other (non HMO) | Admitting: Physical Medicine & Rehabilitation

## 2016-06-10 VITALS — BP 128/87 | HR 57 | Resp 14

## 2016-06-10 DIAGNOSIS — G43909 Migraine, unspecified, not intractable, without status migrainosus: Secondary | ICD-10-CM | POA: Diagnosis not present

## 2016-06-10 DIAGNOSIS — R630 Anorexia: Secondary | ICD-10-CM | POA: Diagnosis not present

## 2016-06-10 DIAGNOSIS — G8114 Spastic hemiplegia affecting left nondominant side: Secondary | ICD-10-CM

## 2016-06-10 DIAGNOSIS — G8194 Hemiplegia, unspecified affecting left nondominant side: Secondary | ICD-10-CM

## 2016-06-10 DIAGNOSIS — I10 Essential (primary) hypertension: Secondary | ICD-10-CM | POA: Diagnosis not present

## 2016-06-10 DIAGNOSIS — N3000 Acute cystitis without hematuria: Secondary | ICD-10-CM

## 2016-06-10 MED ORDER — NITROFURANTOIN MONOHYD MACRO 100 MG PO CAPS: 100.0000 mg | ORAL_CAPSULE | Freq: Two times a day (BID) | ORAL | Status: DC

## 2016-06-10 NOTE — Progress Notes (Signed)
Subjective:    Patient ID: Abigail Campos, female    DOB: 1970-12-30, 45 y.o.   MRN: [id number]  HPI  45 year old right-handed female with history of hypertension, migraine headaches presents for follow up after right basal ganglia hemorrhage.   Last clinic visit 05/15/16.  After that point she was ordered a new PRAFO and a hemiwalker.  She obtained the [facility], but has not received the hemiwalker yet.  She saw her PCP.  She sees her Neurologist 6/12.  She is still in therapies. She has not needed the baclofen.   Pain Inventory Average Pain 0 Pain Right Now 0 My pain is no pain  In the last 24 hours, has pain interfered with the following? General activity 0 Relation with others 0 Enjoyment of life 0 What TIME of day is your pain at its worst? no pain Sleep (in general) Good  Pain is worse with: no pain Pain improves with: no pain Relief from Meds: no pain  Mobility walk with assistance use a walker ability to climb steps?  no do you drive?  no use a wheelchair needs help with transfers transfers alone  Function disabled: date disabled 04/01/2016 I need assistance with the following:  toileting  Neuro/Psych numbness tingling trouble walking spasms dizziness anxiety  Prior Studies hospital f/u  Physicians involved in your care hospital f/u   Family History  Problem Relation Age of Onset  . Diabetes Mother   . Hypertension Mother   . Glaucoma Mother   . Heart disease      2 uncles died 2011-04-01 from CAD-CHF  . Stroke Maternal Grandfather   . Colon cancer Mother     mother age 3  . Diabetes Other     siblings  . Hypertension Other     siblings   Social History   Social History  . Marital Status: Married    Spouse Name: N/A  . Number of Children: 2  . Years of Education: N/A   Occupational History  . TEACHER    Social History Main Topics  . Smoking status: Never Smoker   . Smokeless tobacco: Never Used  . Alcohol Use: No  . Drug Use: No    . Sexual Activity: Not Asked   Other Topics Concern  . None   Social History Narrative   Used to leve independently, stroke 03-31-16, d/c from rehab to his mother's house 04-2016   Past Surgical History  Procedure Laterality Date  . Inguinal hernia repair      04/01/2003  . Cesarean section      [id number]  . Abdominal hysterectomy  11-14-07    no oophorectomy per surgical report   Past Medical History  Diagnosis Date  . Headache(784.0)     developed migraines after 2nd pregnancy, had a negative CT of the head  . Hypertension   . Essential hypertension 10/19/2007    31-Mar-2010: metoprolol changed to bystolic (was not feeling well on it, no specific allergy or reaction)     BP 128/87 mmHg  Pulse 57  Resp 14  SpO2 97%  Opioid Risk Score:   Fall Risk Score:  `1  Depression screen PHQ 2/9  Depression screen [facility] 2/9 06/10/2016 05/15/2016 09/25/2015 07/17/2015  Decreased Interest 0 0 0 0  Down, Depressed, Hopeless 0 1 0 0  PHQ - 2 Score 0 1 0 0  Altered sleeping - 1 - -  Tired, decreased energy - 3 - -  Change in appetite - 0 - -  Feeling bad or failure about yourself  - 0 - -  Trouble concentrating - 0 - -  Moving slowly or fidgety/restless - 0 - -  Suicidal thoughts - 0 - -  PHQ-9 Score - 5 - -    Review of Systems  HENT: Positive for tinnitus.   Gastrointestinal: Positive for constipation.  Neurological: Positive for facial asymmetry, speech difficulty and weakness. Negative for numbness.  All other systems reviewed and are negative.     Objective:   Physical Exam Constitutional: She appears well-developed.  NAD HENT:  Normocephalic and atraumatic.   Eyes: Conjunctivae and EOM are normal.     Cardiovascular: Normal rate and regular rhythm.    Respiratory: Effort normal and breath sounds normal. No respiratory distress.   GI: Soft. Bowel sounds are normal. She exhibits no distension.  Musculoskeletal: She exhibits no edema or no tenderness.   PROM WNL (except for  spasticity) Neurological: She is Alert and oriented 3.  Expressive aphasia (improving) Dysarthria (improving) Left facial weakness Motor: RUE/RLE: 5/5 proximal to distal.   Left upper/lower extremity: 0/5.  Modified [place] 2/4 left finger flexors.  Skin: Skin is warm and dry.  Psychiatric: Smiling, cooperative    Assessment & Plan:  45 year old right-handed female with history of hypertension, migraine headaches presents for follow up after right basal ganglia hemorrhage.    1. Hemiplegia late effect of right basal ganglia hemorrhage  Cont therapies  Cont meds  Follow up Neurology  Cont braces  Cont Fluoxetine 20  2. Migraines  ?Resolved  3. Poor appetite  Improving  4. HTN  Cont to improve  Currently WNL  Cont lisinopril 2.5 on [id number], Bysystolic 5 mg on 5/9 due to questionable left ventricular hypertrophy  5. Spasticity  Mainly in left finger flexors  Not causing pt any issues at present.  She prefers to hold off on treatment for the time being.

## 2016-06-10 NOTE — Telephone Encounter (Signed)
Called and LMOM with her mom- main caretaker.  Also called and LMOM at her home number.  Generally we do not refill abx but she has limited mobility. I did send in refill of macrobid (recnet UTI susceptible to this med) but please call and let me know what her current sx are so I can make sure she is doing ok

## 2016-06-10 NOTE — Telephone Encounter (Signed)
Received refill request form [facility] for refill on Nitrofurantion.   Last OV: 05/20/2016 Last refill: 05/22/16  Is it ok to refill? Please advise. Thanks

## 2016-06-15 ENCOUNTER — Telehealth: Payer: Self-pay

## 2016-06-15 NOTE — Telephone Encounter (Signed)
Erin-ST from [facility] for verbal orders for 2wk4. Verbal orders approved per office protocol.

## 2016-06-17 NOTE — Telephone Encounter (Signed)
According to Dr. Serita Grit last clinic visit note, pt rec'd her Pressure Relief Ankle Foot Orthosis (PRAFO)

## 2016-06-18 ENCOUNTER — Telehealth: Payer: Self-pay | Admitting: Internal Medicine

## 2016-06-18 MED ORDER — LISINOPRIL 2.5 MG PO TABS: 2.5000 mg | ORAL_TABLET | Freq: Every day | ORAL | Status: DC

## 2016-06-18 NOTE — Telephone Encounter (Signed)
It looks like she is supposed to be on lisinopril 2.5, but based on rx date, it appears that she is out of lisinopril. Refills sent to pharmacy. I would recommend that she restart lisinopril. HH RN, please repeat bp in 1 week.  Remind pt not to become pregnant while on this medication. If she is sexually active needs birth control and we can make referral to GYN. Let me know.

## 2016-06-18 NOTE — Telephone Encounter (Signed)
[place] of [facility]'s recommendations. Instructed her to call if questions or concerns.

## 2016-06-18 NOTE — Telephone Encounter (Signed)
Forwarding to [facility] in PCP absence.

## 2016-06-18 NOTE — Telephone Encounter (Signed)
[place] / Home Health Nurse [phone]   She called in to give PCP pt's BP readings. She's concerned because she says that pt's numbers are running high. Informed that PCP is out of the office, she would like to have this addressed sooner if possible.   7/5 12:45- 128/93  7/6 10:45 - 147/102 ;  138/93 ; 131/85; 145/103

## 2016-06-19 ENCOUNTER — Encounter: Payer: Self-pay | Admitting: Medical

## 2016-06-19 ENCOUNTER — Ambulatory Visit (INDEPENDENT_AMBULATORY_CARE_PROVIDER_SITE_OTHER): Payer: Managed Care, Other (non HMO) | Admitting: Medical

## 2016-06-19 VITALS — BP 128/80 | HR 87 | Temp 98.1°F | Ht 59.0 in | Wt 140.0 lb

## 2016-06-19 DIAGNOSIS — I10 Essential (primary) hypertension: Secondary | ICD-10-CM

## 2016-06-19 DIAGNOSIS — I69359 Hemiplegia and hemiparesis following cerebral infarction affecting unspecified side: Secondary | ICD-10-CM | POA: Diagnosis not present

## 2016-06-19 DIAGNOSIS — H9311 Tinnitus, right ear: Secondary | ICD-10-CM

## 2016-06-19 NOTE — Telephone Encounter (Signed)
Received fax from [facility] stating that pt's caregiver called and informed the nurse that her BP was 162/102 and pulse was 52 with no symptoms. Pt was advised by [facility] to follow up with her PCP within 3 days or to call 911/go to the ER if any symptoms such as BP over 180/110, chest pain or difficulty breathing occurs, difficulty walking or talking, or if a severe headache occurs/weakness or numbness of the face, arm or leg on one side of the body.

## 2016-06-19 NOTE — Progress Notes (Signed)
Pre visit review using our clinic review tool, if applicable. No additional management support is needed unless otherwise documented below in the visit note. 

## 2016-06-19 NOTE — Patient Instructions (Addendum)
For your htn would recommend continue to check bp 2-3 times a day. Sounds like majority of time it is undercontrol. If you get bp systolic over 0000000 notify us. Or if diastolic over [id number].   Overall if bp trending over 140/90 would recommend taking 5 mg of the lisinopril. Continue same bystolic.  Mark your bp readings down so neurologist can review next week. Notify us on what neurologist decided regarding if bp meds needed to be changed.  Follow up as regularly scheduled with pcp or as needed  Any bp elevation with new stroke symptoms ED evaluation.  Regarding transient tinnitus if you want me to refer to ENT just let me know.

## 2016-06-19 NOTE — Progress Notes (Signed)
Subjective:    Patient ID: Abigail Campos, female    DOB: January 07, 1971, 45 y.o.   MRN: [id number]  HPI  Pt in for follow up. Since I last saw her she had a stroke on 03/29/16. Reviewed neurolgist hospital note and Physical note on 06-10-2016.  Pt recent has some htn. Her bp at home systolic [id number] most of time. Diastolic usually 80. But last couple of days 160/102. Also on Wednesday [id number] systolic.  Pt has own cuff at home. Pt had no symptoms at time of  elevated high bp readings.  Pt has some transient ringing of ears every day. Last 3 minutes then subsides. Occurs maybe 3 times a day. No nasal congested.    Review of Systems  Constitutional: Negative for fever, chills and fatigue.  Respiratory: Negative for cough, chest tightness, shortness of breath and wheezing.   Cardiovascular: Negative for chest pain and palpitations.  Musculoskeletal: Negative for back pain.  Skin: Negative for rash.  Neurological:       No new deficits. From prior stroke.  Hematological: Negative for adenopathy. Does not bruise/bleed easily.  Psychiatric/Behavioral: Negative for behavioral problems and confusion.    Past Medical History  Diagnosis Date  . Headache(784.0)     developed migraines after 2nd pregnancy, had a negative CT of the head  . Hypertension   . Essential hypertension 10/19/2007    03/29/10: metoprolol changed to bystolic (was not feeling well on it, no specific allergy or reaction)       Social History   Social History  . Marital Status: Married    Spouse Name: N/A  . Number of Children: 2  . Years of Education: N/A   Occupational History  . TEACHER    Social History Main Topics  . Smoking status: Never Smoker   . Smokeless tobacco: Never Used  . Alcohol Use: No  . Drug Use: No  . Sexual Activity: Not on file   Other Topics Concern  . Not on file   Social History Narrative   Used to leve independently, stroke 03-29-2016, d/c from rehab to his mother's house 04-2016     Past Surgical History  Procedure Laterality Date  . Inguinal hernia repair      March 30, 2003  . Cesarean section      [id number]  . Abdominal hysterectomy  11-14-07    no oophorectomy per surgical report    Family History  Problem Relation Age of Onset  . Diabetes Mother   . Hypertension Mother   . Glaucoma Mother   . Heart disease      2 uncles died Mar 30, 2011 from CAD-CHF  . Stroke Maternal Grandfather   . Colon cancer Mother     mother age 66  . Diabetes Other     siblings  . Hypertension Other     siblings    Allergies  Allergen Reactions  . Bee Venom Anaphylaxis  . Peanut-Containing Drug Products Anaphylaxis  . Hydrocodone Hives    Generalize itching w/o rash  . Ambien [Zolpidem Tartrate] Other (See Comments)    forgetfulness  . Aspirin Swelling    Reports blisters w/ ASA but pt reports is ok w/ Motrin, Advil, naproxen  . Latex Hives    Current Outpatient Prescriptions on File Prior to Visit  Medication Sig Dispense Refill  . atorvastatin (LIPITOR) 20 MG tablet Take 1 tablet (20 mg total) by mouth daily at 6 PM. 30 tablet 1  . baclofen (LIORESAL) 10 MG tablet Take  0.5 tablets (5 mg total) by mouth 3 (three) times daily. 90 tablet 1  . butalbital-acetaminophen-caffeine (FIORICET, ESGIC) 50-325-40 MG tablet Take 1 tablet by mouth every 4 (four) hours as needed for headache. 60 tablet 0  . Cholecalciferol (VITAMIN D PO) Take 1 tablet by mouth daily.     . famotidine (PEPCID) 20 MG tablet One at bedtime (Patient taking differently: Take 20 mg by mouth at bedtime. ) 30 tablet 2  . FLUoxetine (PROZAC) 20 MG capsule Take 1 capsule (20 mg total) by mouth daily. 30 capsule 3  . furosemide (LASIX) 20 MG tablet Take 20 mg by mouth daily.    Marland Kitchen lisinopril (PRINIVIL,ZESTRIL) 2.5 MG tablet Take 1 tablet (2.5 mg total) by mouth daily. 30 tablet 2  . nebivolol (BYSTOLIC) 5 MG tablet Take 1 tablet (5 mg total) by mouth every 12 (twelve) hours. 30 tablet 1  . nitrofurantoin,  macrocrystal-monohydrate, (MACROBID) 100 MG capsule Take 1 capsule (100 mg total) by mouth 2 (two) times daily. 14 capsule 0  . pantoprazole (PROTONIX) 40 MG tablet Take 1 tablet (40 mg total) by mouth daily. Take 30-60 min before first meal of the day 30 tablet 2   No current facility-administered medications on file prior to visit.    BP 130/86 mmHg  Pulse 87  Temp(Src) 98.1 F (36.7 C) (Oral)  Ht 4\' 11"  (1.499 m)  Wt 140 lb (63.504 kg)  BMI 28.26 kg/m2  SpO2 98%       Objective:   Physical Exam  General Mental Status- Alert. General Appearance- Not in acute distress.   Skin General: Color- Normal Color. Moisture- Normal Moisture.  Neck Carotid Arteries- Normal color. Moisture- Normal Moisture. No carotid bruits. No JVD.  Chest and Lung Exam Auscultation: Breath Sounds:-Normal.  Cardiovascular Auscultation:Rythm- Regular. Murmurs & Other Heart Sounds:Auscultation of the heart reveals- No Murmurs.  Abdomen Inspection:-Inspeection Normal. Palpation/Percussion:Note:No mass. Palpation and Percussion of the abdomen reveal- Non Tender, Non Distended + BS, no rebound or guarding.    Neurologic Cranial Nerve exam:- CN III-XII intact(No nystagmus), symmetric smile. Strength:- rt side  5/5 equal upper and lower extremities. Lt side weakness upper and lower(same since stroke).      Assessment & Plan:  For your htn would recommend continue to check bp 2-3 times a day. Sounds like majority of time it is undercontrol. If you get bp systolic over 0000000 notify us. Or if diastolic over [id number].   Overall if bp trending over 140/90 would recommend taking 5 mg of the lisinopril. Continue same bystolic.  Mark your bp readings down so neurologist can review next week. Notify us on what neurologist decided regarding if bp meds needed to be changed.  Follow up as regularly scheduled with pcp or as needed  Any bp elevation with new stroke symptoms ED evaluation.  Regarding transient  tinnitus if you want me to refer to ENT just let me know.   Abigail Campos, Abigail Miller, PA-C

## 2016-06-19 NOTE — Telephone Encounter (Signed)
Noted and agree. 

## 2016-06-19 NOTE — Telephone Encounter (Signed)
Noted, pt has appt scheduled w/ Mackie Pai, PA-C today.

## 2016-06-24 ENCOUNTER — Ambulatory Visit (INDEPENDENT_AMBULATORY_CARE_PROVIDER_SITE_OTHER): Payer: Managed Care, Other (non HMO) | Admitting: Neurology

## 2016-06-24 ENCOUNTER — Encounter: Payer: Self-pay | Admitting: Neurology

## 2016-06-24 VITALS — BP 127/94 | HR 59 | Ht 59.0 in | Wt 134.0 lb

## 2016-06-24 DIAGNOSIS — I610 Nontraumatic intracerebral hemorrhage in hemisphere, subcortical: Secondary | ICD-10-CM | POA: Diagnosis not present

## 2016-06-24 DIAGNOSIS — G8104 Flaccid hemiplegia affecting left nondominant side: Secondary | ICD-10-CM | POA: Diagnosis not present

## 2016-06-24 DIAGNOSIS — I10 Essential (primary) hypertension: Secondary | ICD-10-CM

## 2016-06-24 DIAGNOSIS — E785 Hyperlipidemia, unspecified: Secondary | ICD-10-CM | POA: Diagnosis not present

## 2016-06-24 DIAGNOSIS — IMO0001 Reserved for inherently not codable concepts without codable children: Secondary | ICD-10-CM

## 2016-06-24 MED ORDER — BACLOFEN 10 MG PO TABS: 5.0000 mg | ORAL_TABLET | Freq: Three times a day (TID) | ORAL | Status: DC

## 2016-06-24 MED ORDER — CLOPIDOGREL BISULFATE 75 MG PO TABS: 75.0000 mg | ORAL_TABLET | Freq: Every day | ORAL | Status: DC

## 2016-06-24 NOTE — Patient Instructions (Addendum)
-   will start plavix for stroke prevention - continue lipitor for stroke prevention - refill baclofen - continue to follow up with Dr. Posey Pronto for rehab - Follow up with your primary care physician for stroke risk factor modification. Recommend maintain blood pressure goal <130/80, diabetes with hemoglobin A1c goal below 7.0% and lipids with LDL cholesterol goal below 70 mg/dL.  - check BP at home and record - left [place] know if you need any letter or form filled for your disability - work with PT/OT/speech aggressively to improve strength - follow up in 3 months.

## 2016-06-24 NOTE — Progress Notes (Signed)
STROKE NEUROLOGY FOLLOW UP NOTE  NAME: Abigail Campos DOB: [birth year]  REASON FOR VISIT: stroke follow up HISTORY FROM: pt and mom and sister  Today we had the pleasure of seeing Abigail Campos in follow-up at our Neurology Clinic. Pt was accompanied by mom and sister.   History Summary Ms. YSELA Campos is a 45 y.o. female with history of HA and HTN was admitted on 04/08/16 for severe HA and L sided weakness. CT showed a R BG hemorrhage. Repeat CT showed stable hematoma. CTA head and neck showed no AVM or aneurysm. TTE EF 60-65%. LDL 113 and A1C 6.0. BP was high on cleviprex initially but later controlled by multiple BP meds. She still has significant left hemiplegia. She was discharged to [facility] after stabilization.   Interval History During the interval time, the patient has been doing fair. Stayed in CIR for 4 weeks and discharged home. Currently has home PT/OT/speech twice a week, however, pt still has left hemiplegia, not able to move. She also follows with Dr. Posey Pronto and mentioned botox injection but not started yet. BP in good control at home today 127/94. She is currently applying for social security.   REVIEW OF SYSTEMS: Full 14 system review of systems performed and notable only for those listed below and in HPI above, all others are negative:  Constitutional:  Weight loss Cardiovascular: swelling in legs Ear/Nose/Throat:  Ringing in ears Skin:  Eyes:  Blurry vision Respiratory:   Gastroitestinal:   Genitourinary:  Hematology/Lymphatic:   Endocrine:  Musculoskeletal:  Joint pain, joint swelling Allergy/Immunology:   Neurological:  HA, weakness, slurry speech, dizziness Psychiatric: change in appetite Sleep:   The following represents the patient's updated allergies and side effects list: Allergies  Allergen Reactions  . Bee Venom Anaphylaxis  . Peanut-Containing Drug Products Anaphylaxis  . Hydrocodone Hives    Generalize itching w/o rash  . Ambien  [Zolpidem Tartrate] Other (See Comments)    forgetfulness  . Aspirin Swelling    Reports blisters w/ ASA but pt reports is ok w/ Motrin, Advil, naproxen  . Latex Hives    The neurologically relevant items on the patient's problem list were reviewed on today's visit.  Neurologic Examination  A problem focused neurological exam (12 or more points of the single system neurologic examination, vital signs counts as 1 point, cranial nerves count for 8 points) was performed.  Blood pressure 127/94, pulse 59, height 4\' 11"  (1.499 m), weight 134 lb (60.782 kg).  General - Well nourished, well developed, in no apparent distress.  Ophthalmologic - Fundi not visualized due to eye movement.  Cardiovascular - Regular rate and rhythm.  Mental Status -  Level of arousal and orientation to time, place, and person were intact. Language including expression, naming, repetition, comprehension was assessed and found intact. Attention span and concentration were impaired with incorrect on backward spelling and incorrect on calculation. Fund of Knowledge was assessed and was intact.  Cranial Nerves II - XII - II - Visual field intact OU, but right upper quadrant simultagnosia. III, IV, VI - Extraocular movements intact. V - Facial sensation intact bilaterally. VII - left facial droop. VIII - Hearing & vestibular intact bilaterally. X - Palate elevates symmetrically. XI - Chin turning & shoulder shrug intact bilaterally. XII - Tongue protrusion intact.  Motor Strength - The patient's strength was normal in RUE and RLE, but 0/5 LUE and LLE, LLE in brace. Bulk was normal and fasciculations were absent.   Motor Tone -  Muscle tone was assessed at the neck and appendages and was decreased on the left UE and LE.  Reflexes - The patient's reflexes were 1+ in all extremities and she had no pathological reflexes.  Sensory - Light touch, temperature/pinprick were assessed and were decreased on the left, 50%  comparing with right    Coordination - The patient had normal movements in the right hand with no ataxia or dysmetria.  Tremor was absent.  Gait and Station - wheelchair bound, gait not tested.   Functional score  mRS = 4   0 - No symptoms.   1 - No significant disability. Able to carry out all usual activities, despite some symptoms.   2 - Slight disability. Able to look after own affairs without assistance, but unable to carry out all previous activities.   3 - Moderate disability. Requires some help, but able to walk unassisted.   4 - Moderately severe disability. Unable to attend to own bodily needs without assistance, and unable to walk unassisted.   5 - Severe disability. Requires constant nursing care and attention, bedridden, incontinent.   6 - Dead.   NIH Stroke Scale   Level Of Consciousness 0=Alert; keenly responsive 1=Not alert, but arousable by minor stimulation 2=Not alert, requires repeated stimulation 3=Responds only with reflex movements 0  LOC Questions to Month and Age 80=Answers both questions correctly 1=Answers one question correctly 2=Answers neither question correctly 0  LOC Commands      -Open/Close eyes     -Open/close grip 0=Performs both tasks correctly 1=Performs one task correctly 2=Performs neighter task correctly 0  Best Gaze 0=Normal 1=Partial gaze palsy 2=Forced deviation, or total gaze paresis 0  Visual 0=No visual loss 1=Partial hemianopia 2=Complete hemianopia 3=Bilateral hemianopia (blind including cortical blindness) 0  Facial Palsy 0=Normal symmetrical movement 1=Minor paralysis (asymmetry) 2=Partial paralysis (lower face) 3=Complete paralysis (upper and lower face) 2  Motor  0=No drift, limb holds posture for full 10 seconds 1=Drift, limb holds posture, no drift to bed 2=Some antigravity effort, cannot maintain posture, drifts to bed 3=No effort against gravity, limb falls 4=No movement Right Arm 0     Leg 0    Left  Arm 4     Leg 4  Limb Ataxia 0=Absent 1=Present in one limb 2=Present in two limbs 0  Sensory 0=Normal 1=Mild to moderate sensory loss 2=Severe to total sensory loss 1  Best Language 0=No aphasia, normal 1=Mild to moderate aphasia 2=Mute, global aphasia 3=Mute, global aphasia 0  Dysarthria 0=Normal 1=Mild to moderate 2=Severe, unintelligible or mute/anarthric 0  Extinction/Neglect 0=No abnormality 1=Extinction to bilateral simultaneous stimulation 2=Profound neglect 1  Total   12     Data reviewed: I personally reviewed the images and agree with the radiology interpretations.  Ct Head Wo Contrast 04/04/2016 IMPRESSION: 1. Stable right lentiform nucleus hemorrhage with slightly increased localized vasogenic edema. Localized mass effect with 4 mm right-to-left shift not significantly changed. 2. Decreasing small volume acute subarachnoid hemorrhage. 04/02/2016 IMPRESSION: Stable RIGHT putaminal intracerebral hemorrhage, favored to represent a hypertensive related event.  04/01/2016 IMPRESSION: Intraparenchymal hemorrhage arising from the posterior, lateral right basal ganglia with subarachnoid hemorrhage extending into the sylvian fissure on the right. There is mass effect with mild midline shift toward the left of approximately 7 mm. Elsewhere gray-white compartments normal. Mild paranasal sinus disease in the maxillary antra bilaterally. Volume of hemorrhage in the right basal ganglia region is approximately 26 cubic cm. Note that there is additional hemorrhage in the subarachnoid space on  the right which cannot be easily quantified with respect to specific volume of hemorrhage.   Ct Angio Head and neck W/cm &/or Wo Cm 04/02/2016 IMPRESSION: 1. Negative arterial findings on CTA head and neck, aside from mild calcified atherosclerosis of the left ICA siphon. 2. Stable hyperdense intra-axial hemorrhage in the right hemisphere since yesterday. Negative CTA Spot Sign, and no evidence of  associated vascular malformation. Estimated blood volume of 21 mL. Mildly increased surrounding edema, but stable mild regional mass effect. 3. Poor dentition.   2D echocardiogram  Normal LV size and systolic function, EF [id number]. Mild LV hypertrophy. Moderate diastolic dysfunction. Normal RV size and systolic function. No significant valvular abnormalities.  Component     Latest Ref Rng 04/04/2016 05/18/2016  Cholesterol     0 - 200 mg/dL 169 102  Triglycerides     0.0 - 149.0 mg/dL 94 98.0  HDL Cholesterol     >39.00 mg/dL 37 (L) 30.60 (L)  VLDL     0.0 - 40.0 mg/dL 19 19.6  LDL (calc)     0 - 99 mg/dL 113 (H) 52  Total CHOL/HDL Ratio      4.6 3  NonHDL       71.88  Hemoglobin A1C     4.8 - 5.6 % 6.0 (H)   Mean Plasma Glucose      126   TSH     0.350 - 4.500 uIU/mL 0.744   Vitamin B12     180 - 914 pg/mL 280   RPR     Non Reactive Non Reactive   HIV     Non Reactive Non Reactive     Assessment: As you may recall, she is a 45 y.o. African American female with PMH of HA and HTN was admitted on 04/08/16 for severe HA and L sided weakness. CT showed a R BG hemorrhage. CTA head and neck showed no AVM or aneurysm. TTE EF 60-65%. LDL 113 and A1C 6.0. Currently has home PT/OT/speech twice a week, however, pt still has significant left hemiplegia. She also follows with Dr. Posey Pronto in [place]. BP in good control at home with multiple BP meds. ASA allergy, will start plavix for stroke prevention.  Plan:  - will start plavix for stroke prevention - continue lipitor for stroke prevention - continue to follow up with Dr. Posey Pronto for rehab - Follow up with your primary care physician for stroke risk factor modification. Recommend maintain blood pressure goal <130/80, diabetes with hemoglobin A1c goal below 7.0% and lipids with LDL cholesterol goal below 70 mg/dL.  - check BP at home and record - work with PT/OT/speech aggressively  - follow up in 3 months.   I spent more than 25 minutes of face  to face time with the patient. Greater than 50% of time was spent in counseling and coordination of care. We discussed aggressive therapy, staring plavix for stroke prevention and following up with PMR.   No orders of the defined types were placed in this encounter.    Meds ordered this encounter  Medications  . DISCONTD: baclofen (LIORESAL) 10 MG tablet    Sig: Take 0.5 tablets (5 mg total) by mouth 3 (three) times daily.    Dispense:  90 tablet    Refill:  3  . clopidogrel (PLAVIX) 75 MG tablet    Sig: Take 1 tablet (75 mg total) by mouth daily.    Dispense:  30 tablet    Refill:  11  . baclofen (LIORESAL)  10 MG tablet    Sig: Take 0.5 tablets (5 mg total) by mouth 3 (three) times daily.    Dispense:  90 tablet    Refill:  3    Patient Instructions  - will start plavix for stroke prevention - continue lipitor for stroke prevention - refill baclofen - continue to follow up with Dr. Posey Pronto for rehab - Follow up with your primary care physician for stroke risk factor modification. Recommend maintain blood pressure goal <130/80, diabetes with hemoglobin A1c goal below 7.0% and lipids with LDL cholesterol goal below 70 mg/dL.  - check BP at home and record - left [place] know if you need any letter or form filled for your disability - work with PT/OT/speech aggressively to improve strength - follow up in 3 months.    Rosalin Hawking, MD PhD [facility] [address] [place], [place] [address] [phone]

## 2016-06-30 ENCOUNTER — Telehealth: Payer: Self-pay | Admitting: Physical Medicine & Rehabilitation

## 2016-06-30 NOTE — Telephone Encounter (Signed)
Patient is calling about Botox.  She is wanting to know if she is supposed to get on her next visit 08/13/16.

## 2016-07-01 ENCOUNTER — Telehealth: Payer: Self-pay | Admitting: Physical Medicine & Rehabilitation

## 2016-07-01 DIAGNOSIS — I69959 Hemiplegia and hemiparesis following unspecified cerebrovascular disease affecting unspecified side: Secondary | ICD-10-CM

## 2016-07-01 DIAGNOSIS — I69320 Aphasia following cerebral infarction: Secondary | ICD-10-CM

## 2016-07-01 DIAGNOSIS — I69359 Hemiplegia and hemiparesis following cerebral infarction affecting unspecified side: Secondary | ICD-10-CM

## 2016-07-01 DIAGNOSIS — I69321 Dysphasia following cerebral infarction: Secondary | ICD-10-CM

## 2016-07-01 NOTE — Telephone Encounter (Signed)
Orders placed and Erin notified.

## 2016-07-01 NOTE — Telephone Encounter (Signed)
Erin ST with [facility] will be finishing up her Home Health orders and needs to get orders for patient to start outpatient therapy.  Please call her at [phone].

## 2016-07-01 NOTE — Telephone Encounter (Signed)
Patient would like to know if she is going to be able to have Botox on her next visit.  Please call her at [phone].

## 2016-07-01 NOTE — Telephone Encounter (Signed)
We can discuss it on our next visit.  I did not visit Botox injections on last visit, because she stated she was comfortable with the medications and did not wish to pursue any further intervention.  Thank you.

## 2016-07-03 ENCOUNTER — Ambulatory Visit
Admission: RE | Admit: 2016-07-03 | Discharge: 2016-07-03 | Disposition: A | Discharge status: Home / Self Care | Payer: Managed Care, Other (non HMO) | Source: Ambulatory Visit | Attending: Family Medicine | Admitting: Family Medicine

## 2016-07-03 DIAGNOSIS — I69359 Hemiplegia and hemiparesis following cerebral infarction affecting unspecified side: Secondary | ICD-10-CM

## 2016-07-03 IMAGING — US US PELVIS LIMITED
1 series · 11 of 11 positions shown · non-contrast
Comparison: Pelvic ultrasound [DATE]

CLINICAL DATA: Hemiplegia, no hematuria, postvoid residual volume

EXAM:
LIMITED ULTRASOUND OF PELVIS
TECHNIQUE: Limited transabdominal ultrasound examination of the pelvis was
performed.

[Series 1: us pelvis limited · 11 of 11 slices shown]
[im 1/11]
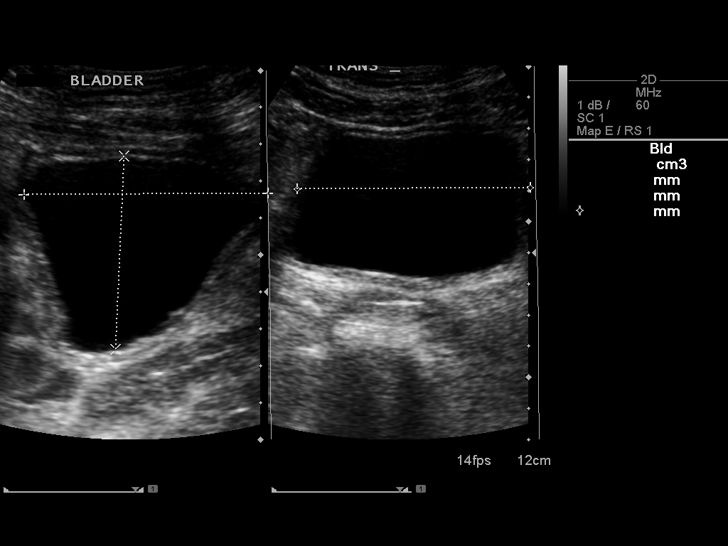
[im 2/11]
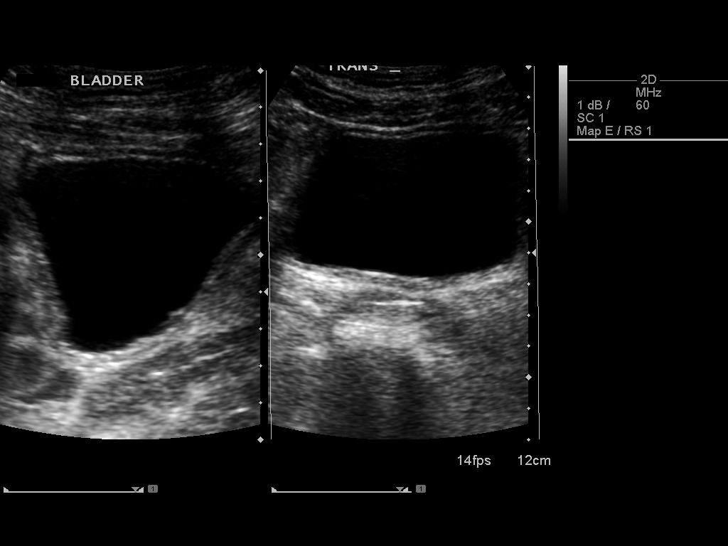
[im 3/11]
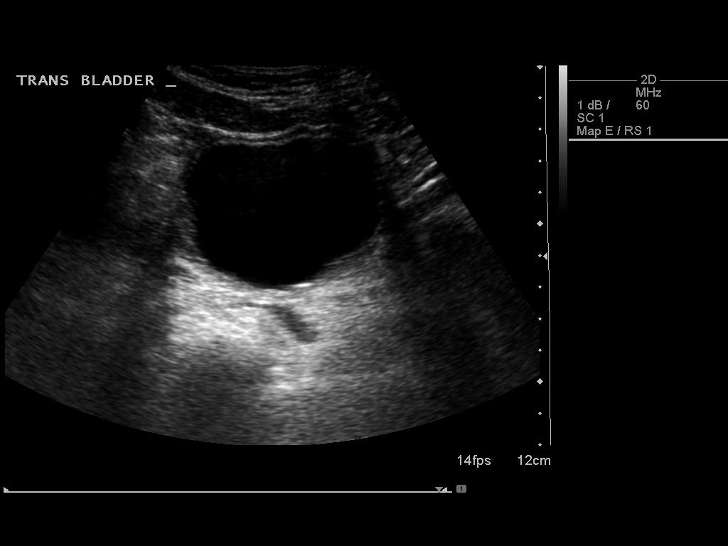
[im 4/11]
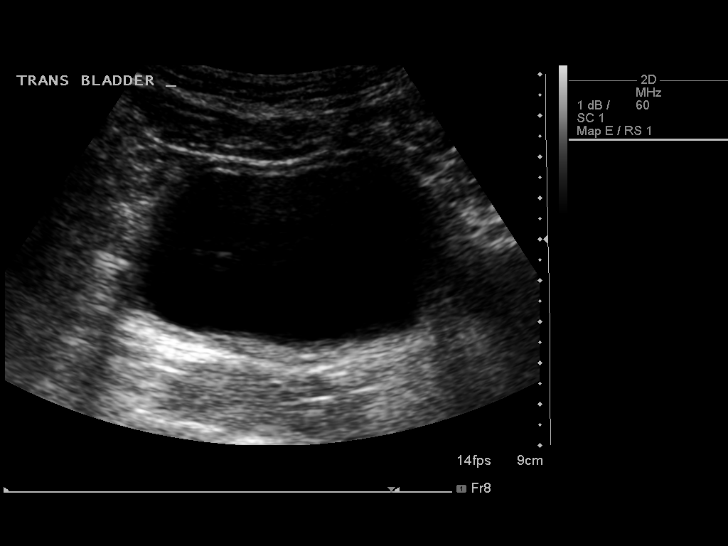
[im 5/11]
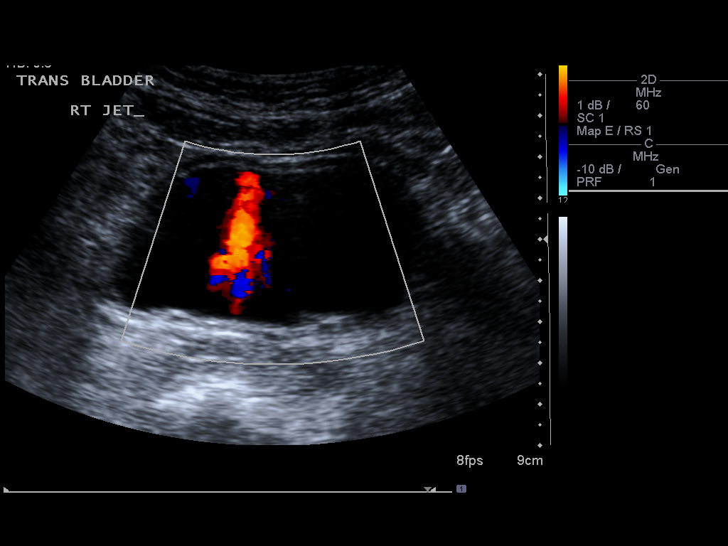
[im 6/11]
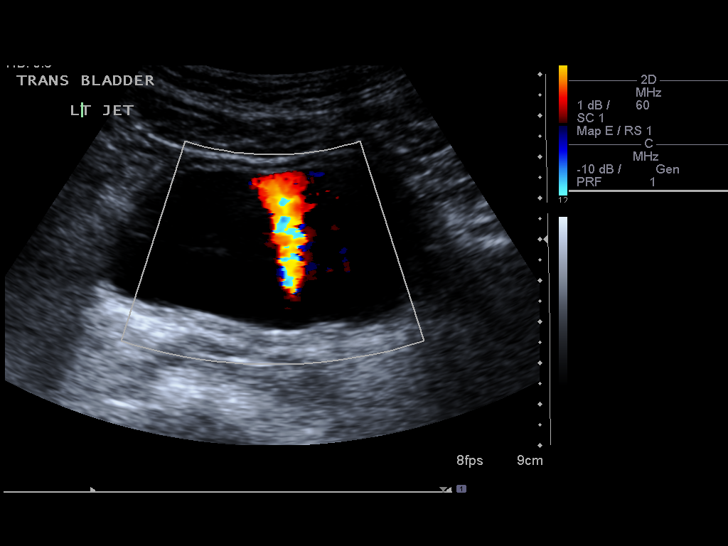
[im 7/11]
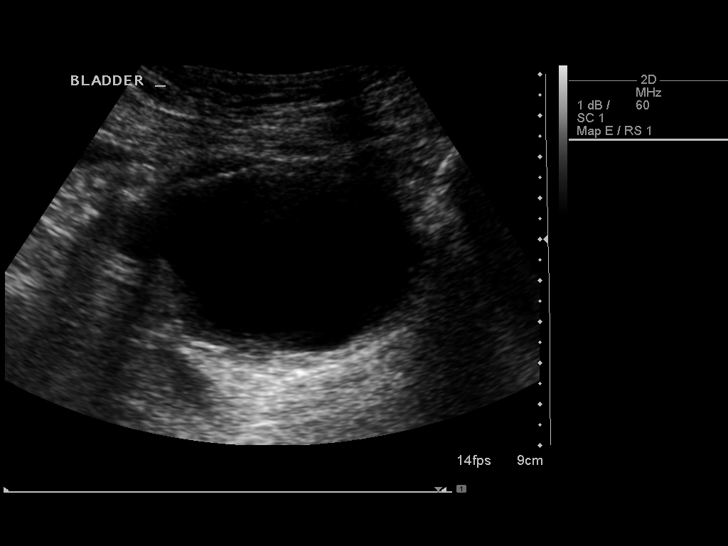
[im 8/11]
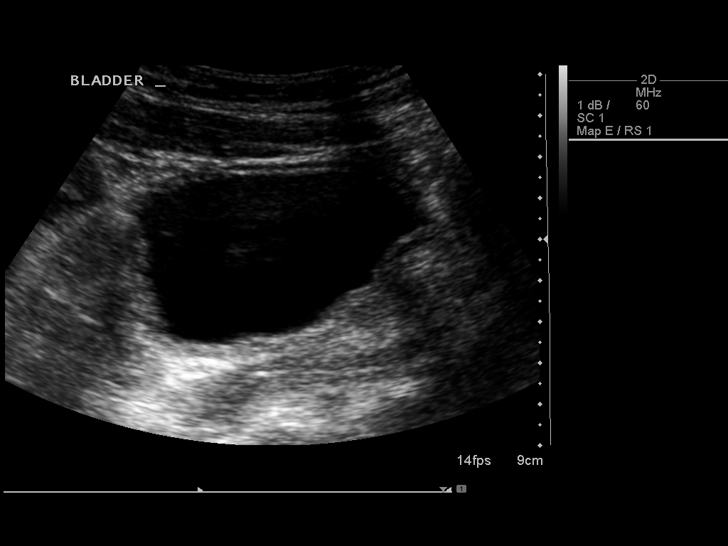
[im 9/11]
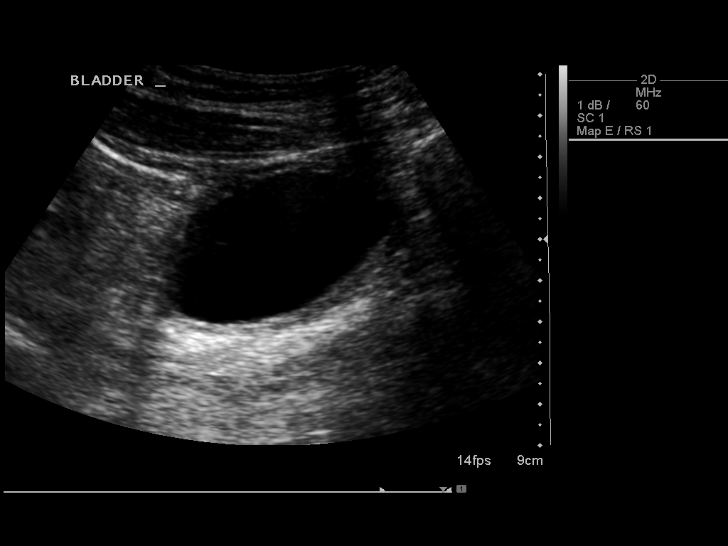
[im 10/11]
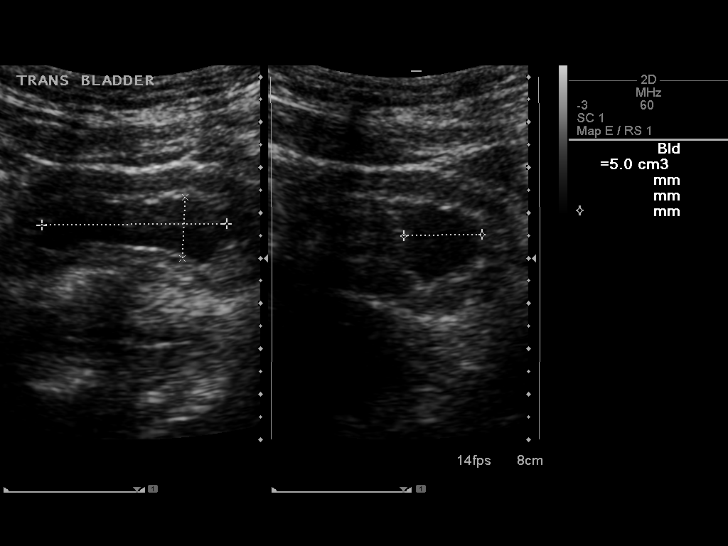
[im 11/11]
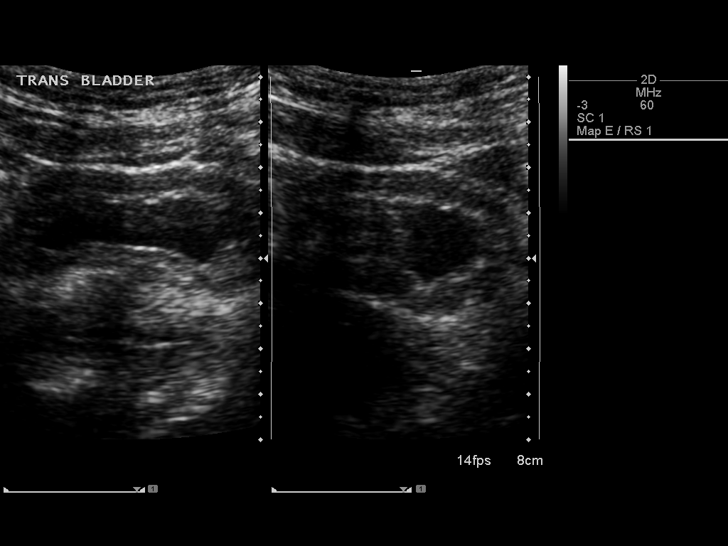

[11 of 11 positions shown; findings below may reference images not displayed]

FINDINGS: The urinary bladder has a normal appearance for the degree of
distention. Bilateral ureteral jets are observed.

Pre-void volume:  136 cc ml

Post-void volume:  5 cc ml

Other findings:  None.
IMPRESSION: Only a tiny residual urinary bladder volume of 5 cc is observed.

## 2016-07-03 NOTE — Telephone Encounter (Signed)
Contacted patient and informed that she can discuss and begin the process of receiving Botox at her next visit with Dr. Posey Pronto

## 2016-07-03 NOTE — Telephone Encounter (Signed)
co

## 2016-07-04 ENCOUNTER — Telehealth: Payer: Self-pay | Admitting: Family Medicine

## 2016-07-04 NOTE — Telephone Encounter (Signed)
Called and LMOM with her mom [name]- no evidence of abnormal urinary retention.  Let me know if any questions

## 2016-07-06 ENCOUNTER — Telehealth: Payer: Self-pay | Admitting: Physical Medicine & Rehabilitation

## 2016-07-06 ENCOUNTER — Telehealth: Payer: Self-pay | Admitting: Internal Medicine

## 2016-07-06 ENCOUNTER — Other Ambulatory Visit: Payer: Self-pay

## 2016-07-06 MED ORDER — NEBIVOLOL HCL 5 MG PO TABS: 5.0000 mg | ORAL_TABLET | Freq: Two times a day (BID) | ORAL | 2 refills | Status: DC

## 2016-07-06 MED ORDER — ATORVASTATIN CALCIUM 20 MG PO TABS
20.0000 mg | ORAL_TABLET | Freq: Every day | ORAL | 2 refills | Status: DC
Start: 2016-07-06 — End: 2016-09-18

## 2016-07-06 MED ORDER — ATORVASTATIN CALCIUM 20 MG PO TABS: 20.0000 mg | ORAL_TABLET | Freq: Every day | ORAL | 2 refills | Status: DC

## 2016-07-06 NOTE — Telephone Encounter (Signed)
Gracie PT with [facility] will be discharging patient this week and she needs referral for patient to start outpatient therapy.  Gracie's number is [phone] or patients number is [phone].

## 2016-07-06 NOTE — Telephone Encounter (Signed)
Requested Rx's sent. Pt will be due for 3 month follow-up w/ Dr. Larose Kells in September. Please schedule at Pt's convenience (30 minutes). Thank you.

## 2016-07-06 NOTE — Telephone Encounter (Signed)
Please advise 

## 2016-07-06 NOTE — Telephone Encounter (Signed)
Caller name: Dorothe Pea Relationship to patient: Mother Can be reached: [phone]  Pharmacy:  [facility] [place], [place] [phone] (Phone) [phone] (Fax)     Reason for call: Request refills on nebivolol (BYSTOLIC) 5 MG tablet And atorvastatin (LIPITOR) 20 MG tablet

## 2016-07-07 NOTE — Telephone Encounter (Signed)
We may refer her for outpatient therapies.  Thanks. Ankit

## 2016-07-07 NOTE — Telephone Encounter (Signed)
Please make the appropriate referrals. Please and thank you.

## 2016-07-08 ENCOUNTER — Telehealth: Payer: Self-pay | Admitting: Physical Medicine & Rehabilitation

## 2016-07-08 DIAGNOSIS — I69959 Hemiplegia and hemiparesis following unspecified cerebrovascular disease affecting unspecified side: Secondary | ICD-10-CM

## 2016-07-08 DIAGNOSIS — I69354 Hemiplegia and hemiparesis following cerebral infarction affecting left non-dominant side: Secondary | ICD-10-CM

## 2016-07-08 NOTE — Telephone Encounter (Signed)
Order placed for outpt neuro rehab PT and Gracie notified.

## 2016-07-08 NOTE — Telephone Encounter (Signed)
Gracie PT with [facility] left a message this week to get patient started on outpatient therapy next week.  She will be discharging patient from home health care this week.  Please call Gracie at [phone].

## 2016-07-09 ENCOUNTER — Telehealth: Payer: Self-pay | Admitting: Physical Medicine & Rehabilitation

## 2016-07-09 DIAGNOSIS — I69959 Hemiplegia and hemiparesis following unspecified cerebrovascular disease affecting unspecified side: Secondary | ICD-10-CM

## 2016-07-09 DIAGNOSIS — I69354 Hemiplegia and hemiparesis following cerebral infarction affecting left non-dominant side: Secondary | ICD-10-CM

## 2016-07-09 NOTE — Telephone Encounter (Signed)
Order for outpt OT with neuro rehab placed

## 2016-07-09 NOTE — Telephone Encounter (Signed)
Izora Gala OT with [facility] needs to get an order for patient to start outpatient PT, OT with [facility] outpatient therapy.  She also needs to get verbal orders for her discharge this week for OT.  Patient also had a fall yesterday with no injuries.  Any questions please call her at [phone].

## 2016-07-16 ENCOUNTER — Telehealth: Payer: Self-pay | Admitting: Internal Medicine

## 2016-07-16 NOTE — Telephone Encounter (Signed)
Pt's Mother dropped off document to be filled out and faxed to [phone] (info. On document, who to fax too). Mother states document needs to be faxed by 07-21-2016. (Document [employer] for Duty Certification. Document at front desk tray.

## 2016-07-20 NOTE — Telephone Encounter (Signed)
Called patient advised that per Dr. Larose Kells she will need to take form to Neurologist. Patient agreed to pick up.

## 2016-07-21 ENCOUNTER — Ambulatory Visit: Payer: Managed Care, Other (non HMO)

## 2016-07-21 ENCOUNTER — Other Ambulatory Visit: Payer: Self-pay | Admitting: Internal Medicine

## 2016-07-21 DIAGNOSIS — R05 Cough: Secondary | ICD-10-CM

## 2016-07-21 DIAGNOSIS — R058 Other specified cough: Secondary | ICD-10-CM

## 2016-07-27 ENCOUNTER — Other Ambulatory Visit: Payer: Self-pay | Admitting: Internal Medicine

## 2016-07-27 DIAGNOSIS — R05 Cough: Secondary | ICD-10-CM

## 2016-07-27 DIAGNOSIS — R058 Other specified cough: Secondary | ICD-10-CM

## 2016-08-11 ENCOUNTER — Ambulatory Visit: Payer: Managed Care, Other (non HMO)

## 2016-08-11 ENCOUNTER — Ambulatory Visit: Payer: Managed Care, Other (non HMO) | Attending: Physical Medicine & Rehabilitation | Admitting: Occupational Therapy

## 2016-08-11 DIAGNOSIS — R41841 Cognitive communication deficit: Secondary | ICD-10-CM | POA: Insufficient documentation

## 2016-08-11 DIAGNOSIS — I69352 Hemiplegia and hemiparesis following cerebral infarction affecting left dominant side: Secondary | ICD-10-CM

## 2016-08-11 DIAGNOSIS — I69252 Hemiplegia and hemiparesis following other nontraumatic intracranial hemorrhage affecting left dominant side: Secondary | ICD-10-CM | POA: Insufficient documentation

## 2016-08-11 DIAGNOSIS — R279 Unspecified lack of coordination: Secondary | ICD-10-CM | POA: Insufficient documentation

## 2016-08-11 DIAGNOSIS — M25512 Pain in left shoulder: Secondary | ICD-10-CM

## 2016-08-11 DIAGNOSIS — I69115 Cognitive social or emotional deficit following nontraumatic intracerebral hemorrhage: Secondary | ICD-10-CM | POA: Diagnosis present

## 2016-08-11 DIAGNOSIS — R2689 Other abnormalities of gait and mobility: Secondary | ICD-10-CM | POA: Diagnosis present

## 2016-08-11 DIAGNOSIS — R2681 Unsteadiness on feet: Secondary | ICD-10-CM | POA: Diagnosis present

## 2016-08-11 DIAGNOSIS — R278 Other lack of coordination: Secondary | ICD-10-CM

## 2016-08-11 DIAGNOSIS — I69015 Cognitive social or emotional deficit following nontraumatic subarachnoid hemorrhage: Secondary | ICD-10-CM

## 2016-08-11 NOTE — Patient Instructions (Signed)
  Apps for thinking:  Constant Therapy  Neuronation  Lumosity  Peak  Words with Friends Sudoku

## 2016-08-11 NOTE — Therapy (Addendum)
[facility] [address] [place], [place], [address] Phone: [phone]   Fax:  [phone]  Speech Language Pathology Evaluation  Patient Details  Name: Abigail Campos MRN: [id number] Date of Birth: 07-14-1971 Referring Provider: Delice Lesch, M.D.  Encounter Date: 08/11/2016      End of Session - 08/11/16 1708    Visit Number 1   Number of Visits 16   Date for SLP Re-Evaluation 10/23/16   Authorization Type 240 total visits  ?? #HH visits?   Authorization - Number of Visits 240   SLP Start Time [id number]   SLP Stop Time  [id number]   SLP Time Calculation (min) 45 min      Past Medical History:  Diagnosis Date  . Essential hypertension 10/19/2007   03-2010: metoprolol changed to bystolic (was not feeling well on it, no specific allergy or reaction)    . Headache(784.0)    developed migraines after 2nd pregnancy, had a negative CT of the head  . Hypertension   . Stroke [facility])     Past Surgical History:  Procedure Laterality Date  . ABDOMINAL HYSTERECTOMY  11-14-07   no oophorectomy per surgical report  . CESAREAN SECTION     [id number]  . INGUINAL HERNIA REPAIR     2004    There were no vitals filed for this visit.      Subjective Assessment - 08/11/16 1646    Subjective Pt provided one difference in cognition comparing now to premorbidly, the one provided by this SLP (concentration).   Patient is accompained by: --  mother, [name]   Currently in Pain? No/denies            SLP Evaluation [facility] - 08/11/16 1646      SLP Visit Information   SLP Received On 08/11/16   Referring Provider Delice Lesch, M.D.   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 She underwent a long stay on CIR where she had ST and then HHST x2/week x8 weeks, ending the last week in July.      Prior  Functional Status   Cognitive/Linguistic Baseline Within functional limits   Type of Home House    Lives With Spouse;Family   Available Support Family     Cognition   Overall Cognitive Status Impaired/Different from baseline   Area of Impairment Attention;Awareness;Problem solving   Current Attention Level Sustained   Attention Comments pt consistently looking around ST room and/or back towards door, throughout session, when noise occurred outside    Awareness Intellectual   Awareness Comments pt named one cognitive deficit, named by SLP 15 seconds earlier, when asked for deficits; unaware of errors with clock drawing or of decr'd speech intelligibility/rapid speech rate   Problem Solving Requires verbal cues   Problem Solving Comments req'd cues to draw clock larger to fit all numbers into clock face   Behaviors Impulsive     Auditory Comprehension   Overall Auditory Comprehension Appears within functional limits for tasks assessed     Oral Motor/Sensory Function   Overall Oral Motor/Sensory Function --  not tested due to time - will do so ASAP   Overall Oral Motor/Sensory Function pt had dysnomia x1 during evaluation, but no other difficulties noted during evaluation      Motor Speech   Overall Motor Speech --  not tested due to time - will do so ASAP   Intelligibility Intelligibility reduced   Conversation 75-100% accurate  approx 95%      Standardized Assessments   Standardized Assessments  Montreal Cognitive Assessment (MOCA)   Montreal Cognitive Assessment (MOCA)  20/30                         SLP Education - 08/11/16 1707    Education provided Yes   Education Details apps for cognition, possible therapy foci for ST   Person(s) Educated Patient;Parent(s)   Methods Explanation;Handout   Comprehension Verbalized understanding;Need further instruction  pt needs further instruction          SLP Short Term Goals - 08/11/16 1713      SLP SHORT TERM  GOAL #1   Title pt will name 3 deficit areas over three sessions   Time 4   Period Weeks   Status New     SLP SHORT TERM GOAL #2   Title pt will demo selective attention to therapy tasks for 10 minutes in min-mod noisy environment   Time 4   Period Weeks   Status New     SLP SHORT TERM GOAL #3   Title pt will complete tasks of simple cognitive linguistic organization with 90% success and rare min A   Time 4   Period Weeks   Status New     SLP SHORT TERM GOAL #4   Title pt will attempt to self correct misarticulated speech in 50% of opportunities in structured speech tasks   Time 4   Period Weeks   Status New          SLP Long Term Goals - 08/11/16 1718      [place] #1   Title pt will demo alternating attention in simple cognitive linguistic tasks with appropriate switch times, with compensations   Time 8   Period Weeks   Status New     SLP LONG TERM GOAL #2   Title pt will demo emergent awareness in cognitive linguistic tasks by self correcting errors in 80% of opportunities with nonverbal cues   Time 8   Period Weeks   Status New     SLP LONG TERM GOAL #3   Title pt will demo functional organization in mod complex linguistic tasks with modified independence   Time 8   Period Weeks   Status New     SLP LONG TERM GOAL #4   Title pt will demo appropriate rate of speech in 5 minutes simple to mod complex conversation with occasional nonverbal cues   Time 8   Period Weeks   Status New          Plan - 08/11/16 1709    Clinical Impression Statement Pt presents with mod cognitive linguistic deficits including planning/organization (executive function), awareness, and attention. Pt may have dysarthria as well. Skilled ST needed to decr caregiver burden and return pt to greater level of independence.   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 Left flaccid hemiplegia ([place]) 06/24/2016  . Ear itching   . Poor fluid intake   . Bradycardia, drug induced   . Poor nutrition   . Muscle spasticity   . Obesity (BMI 30.0-34.9) 04/08/2016  . Hyperlipidemia 04/08/2016  . Basal ganglia hemorrhage ([place]) 04/08/2016  . Aphasia as late effect of cerebrovascular accident   . Dysphagia as late effect of cerebrovascular disease   . Migraine with aura and without status migrainosus, not intractable   . HLD (hyperlipidemia)   . Benign essential HTN   . Gait disturbance, post-stroke   . Hemiplegia, post-stroke ([place])   . Hypokalemia   . Dysphagia, post-stroke   . Aphasia, post-stroke   . Bradycardia   . Headache, migraine   . Dysarthria, post-stroke   . Encephalopathy acute 04/02/2016  . ICH (intracerebral hemorrhage) (HCC) - R basal ganglia due to hypertensive emergency 04/01/2016  . Upper airway cough syndrome 12/06/2015  . PCP NOTES >>> 09/25/2015  . Need for hepatitis B vaccination 10/19/2014  . Insomnia 12/28/2012  . Annual physical exam 11/23/2011  . Hemorrhoids 10/13/2011  . Morbid obesity ([place]) 02/26/2011  . CHEST PAIN UNSPECIFIED 03/28/2010  . Essential hypertension 10/19/2007  . Headache(784.0) 05/12/2007    [place] ,[place], [place]  08/11/2016, 5:26 PM  [facility] [address] [place], [place], [address] Phone: [phone]   Fax:  [phone]  Name: Abigail Campos MRN: [id number] Date of Birth: [birth year]

## 2016-08-11 NOTE — Therapy (Signed)
[facility] [address] [place], [place], [address] Phone: [phone]   Fax:  [phone]  Physical Therapy Evaluation  Patient Details  Name: Abigail Campos MRN: [id number] Date of Birth: [birth year] Referring Provider: Dr. Posey Pronto  Encounter Date: 08/11/2016      PT End of Session - 08/11/16 1609    Visit Number 1   Number of Visits 17   Date for PT Re-Evaluation 10/10/16   Authorization Type [employer]-240 visit limit combine Sp/OT/PT with [facility]   PT Start Time 1449   PT Stop Time 1529   PT Time Calculation (min) 40 min   Equipment Utilized During Treatment Gait belt   Activity Tolerance Patient tolerated treatment well   Behavior During Therapy [facility] for tasks assessed/performed      Past Medical History:  Diagnosis Date  . Essential hypertension 10/19/2007   03-2010: metoprolol changed to bystolic (was not feeling well on it, no specific allergy or reaction)    . Headache(784.0)    developed migraines after 2nd pregnancy, had a negative CT of the head  . Hypertension   . Stroke [facility])     Past Surgical History:  Procedure Laterality Date  . ABDOMINAL HYSTERECTOMY  11-14-07   no oophorectomy per surgical report  . CESAREAN SECTION     [id number]  . INGUINAL HERNIA REPAIR     2004    There were no vitals filed for this visit.       Subjective Assessment - 08/11/16 1455    Subjective Pt presenting to OPPT neuro s/p CVA (04/01/16), she was hospitalized and received inpt rehab and HHPT. Pt states walking is difficult. She reports she was amb. with HW with HHPT. Pt has a 10y/o son and would like to be able to play with her son.  Pt reports she has a lot of ear popping and ringing in ears, but pt reports it could be a side effect from the medication. Pt reports she occasionally feels dizzy during amb., but belives it is because she's holding her breath. Pt noted to have L PRAFO donned and stated this is the only  brace she has.    Patient is accompained by: Family member  [name]   Pertinent History HTN, hyperlipidemia, hx of migraine   Patient Stated Goals I want to walk and stand up without falling.    Currently in Pain? No/denies            [facility] PT Assessment - 08/11/16 1458      Assessment   Medical Diagnosis Hemiplegia and hemiparesis following cerebral infarction affecting left non-dominant side [facility])   Referring Provider Dr. Posey Pronto   Onset Date/Surgical Date 04/01/16   Hand Dominance Left   Prior Therapy Inpt rehab and HHPT     Precautions   Precautions Fall     Restrictions   Weight Bearing Restrictions No     Balance Screen   Has the patient fallen in the past 6 months Yes   How many times? 2  both in July (once OOB and once in bathroom)   Has the patient had a decrease in activity level because of a fear of falling?  No   Is the patient reluctant to leave their home because of a fear of falling?  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      Coordination   Gross Motor Movements are Fluid and Coordinated No   Fine Motor Movements are Fluid and Coordinated No   Coordination and Movement Description Impaired 2/2 LLE weakness.     Posture/Postural Control   Posture/Postural Control --     ROM / Strength   AROM / PROM / Strength AROM;Strength     AROM   Overall AROM  Deficits   Overall AROM Comments LLE limited in all AROM 2/2 weakness.     Strength   Overall Strength Deficits   Overall Strength Comments RLE WFL (4/5 grossly). LLE: hip flexion: 1/5, knee ext: 2+/5, knee flex: 2/5, ankle DF: 1/5. L hip abd/ext not formally tested but suspected weakness 2/2 gait deviations.     Transfers   Transfers Sit to Stand;Stand to [employer] Transfers   Sit to Stand 4: Min guard;With upper extremity assist;From chair/3-in-1   Stand to Sit 5: Supervision;With upper extremity assist;To chair/3-in-1   Stand Pivot Transfers 4: Min guard;With armrests     Ambulation/Gait   Ambulation/Gait Yes   Ambulation/Gait Assistance 4: Min guard;4: Min assist   Ambulation/Gait Assistance Details Pt required min A during one LOB episode 2/2 poor L toe clearance.    Ambulation Distance (Feet) 40 Feet  and 50'    Assistive device Hemi-walker   Gait Pattern Step-to pattern;Decreased stride length;Decreased stance time - left;Decreased step length - right;Decreased dorsiflexion - left;Left circumduction;Left genu recurvatum   Ambulation Surface Level;Indoor   Gait velocity 0.23ft/sec.  with hemi-walker     Standardized Balance Assessment   Standardized Balance Assessment Timed Up and Go Test     Timed Up and Go Test   TUG Normal TUG   Normal TUG (seconds) 54.3  with hemi-walker                           PT Education - 08/11/16 1606    Education provided Yes   Education Details  PT discussed frequency/duration and outcome measure results. PT asked pt to bring her HW to next session to ensure it is proper height for pt. PT discussed having a consult with orthotist during PT POC to fit pt with a brace for amb, as pt stated she only has L PRAFO from inpt rehab.   Person(s) Educated Patient;Parent(s)   Methods Explanation   Comprehension Verbalized understanding          PT Short Term Goals - 08/11/16 1624  PT SHORT TERM GOAL #1   Title Pt will be IND in performing HEP to improve strength and balance. TARGET DATE FOR ALL STGS: 09/08/16   Status New     PT SHORT TERM GOAL #2   Title Perform BERG and write goals if appropriate.   Status New     PT SHORT TERM GOAL #3   Title Pt will amb. 200' over even terrain with LRAD at MOD I level to improve functional mobility.    Status New     PT SHORT TERM GOAL #4   Title Pt will improve gait speed to 0.35ft/sec with LRAD to decrease falls risk.    Status New           PT Long Term Goals - 08/11/16 1626      PT LONG TERM GOAL #1   Title Pt will verbalize understanding or CVA risk factors and signs/symptoms to reduce risk of additional CVA. TARGET DATE FOR ALL LTGS: 10/06/16   Status New     PT LONG TERM GOAL #2   Title Pt will improve gait speed to >/=1.65ft/sec. with LRAD to reduce falls risk.    Status New     PT LONG TERM GOAL #3   Title Pt will amb. 400' over even/paved surfaces with LRAD at MOD I level to improve functional mobility.    Status New     PT LONG TERM GOAL #4   Title Pt will perform TUG with LRAD in </=30sec. to improve function and decr. falls risk.    Status New     PT LONG TERM GOAL #5   Title Pt will perform stand pivot txfs from chair to w/c at MOD I level to improve functional mobility.    Status New               Plan - 08/11/16 1618    Clinical Impression Statement Pt is a pleasant 44y/o female presenting to OPPT neuro s/p R basal ganglia hemorrhage on 04/01/16. Pt's  PMH significant for the following: HTN, migraines, and hyperlipidemia. The following deficits were noted: gait deviations, decreasd endurance, impaired strength, impaired balance, impaired ROM, impaired sensation. Pt's gait speed and TUG time indicate pt is at risk for falls. PT will perform BERG next session.    Rehab Potential Good   Clinical Impairments Affecting Rehab Potential co-morbidities   PT Frequency 2x / week   PT Duration 8 weeks   PT Treatment/Interventions ADLs/Self Care Home Management;Biofeedback;Canalith Repostioning;Electrical Stimulation;Cognitive remediation;Neuromuscular re-education;Balance training;Therapeutic exercise;Therapeutic activities;Functional mobility training;Stair training;Gait training;DME Instruction;Orthotic Fit/Training;Patient/family education;Vestibular;Manual techniques   PT Next Visit Plan CVA ed, ask about [place] visit #, Perform BERG and write goal. Provided balance and strengthening HEP.   Consulted and Agree with Plan of Care Patient;Family member/caregiver   Family Member Consulted pt's mom: [name]      Patient will benefit from skilled therapeutic intervention in order to improve the following deficits and impairments:  Abnormal gait, Decreased endurance, Decreased knowledge of use of DME, Decreased strength, Impaired UE functional use, Impaired sensation, Decreased balance, Decreased mobility, Decreased range of motion, Decreased coordination, Impaired flexibility, Decreased cognition  Visit Diagnosis: Hemiplegia and hemiparesis following cerebral infarction affecting left dominant side ([place]) - Plan: PT plan of care cert/re-cert  Other abnormalities of gait and mobility - Plan: PT plan of care cert/re-cert  Unspecified lack of coordination - Plan: PT plan of care cert/re-cert     Problem List Patient Active Problem List   Diagnosis Date Noted  .  Left flaccid hemiplegia ([place]) 06/24/2016  . Ear itching   . Poor fluid intake   . Bradycardia,  drug induced   . Poor nutrition   . Muscle spasticity   . Obesity (BMI 30.0-34.9) 04/08/2016  . Hyperlipidemia 04/08/2016  . Basal ganglia hemorrhage ([place]) 04/08/2016  . Aphasia as late effect of cerebrovascular accident   . Dysphagia as late effect of cerebrovascular disease   . Migraine with aura and without status migrainosus, not intractable   . HLD (hyperlipidemia)   . Benign essential HTN   . Gait disturbance, post-stroke   . Hemiplegia, post-stroke ([place])   . Hypokalemia   . Dysphagia, post-stroke   . Aphasia, post-stroke   . Bradycardia   . Headache, migraine   . Dysarthria, post-stroke   . Encephalopathy acute 04/02/2016  . ICH (intracerebral hemorrhage) (HCC) - R basal ganglia due to hypertensive emergency 04/01/2016  . Upper airway cough syndrome 12/06/2015  . PCP NOTES >>> 09/25/2015  . Need for hepatitis B vaccination 10/19/2014  . Insomnia 12/28/2012  . Annual physical exam 11/23/2011  . Hemorrhoids 10/13/2011  . Morbid obesity ([place]) 02/26/2011  . CHEST PAIN UNSPECIFIED 03/28/2010  . Essential hypertension 10/19/2007  . Headache(784.0) 05/12/2007    Raisa Ditto L 08/11/2016, 4:32 PM  [facility] [address] [place], [place], [address] Phone: [phone]   Fax:  [phone]  Name: VALOIS PALMITER MRN: [id number] Date of Birth: 26-Feb-1971  Geoffry Paradise, PT,DPT 08/11/16 4:32 PM Phone: [phone] Fax: [phone]

## 2016-08-12 NOTE — Therapy (Signed)
[facility] [address] [place], [place], [address] Phone: [phone]   Fax:  [phone]  Occupational Therapy Evaluation  Patient Details  Name: Abigail Campos MRN: [id number] Date of Birth: 1971-04-07 Referring Provider: Dr. Posey Pronto  Encounter Date: 08/11/2016      OT End of Session - 08/12/16 1603    Visit Number 1   Number of Visits 25   Date for OT Re-Evaluation 10/09/16   Authorization Type [employer]   Authorization Time Period await clarification of remaining visits   OT Start Time 1406   OT Stop Time 1445   OT Time Calculation (min) 39 min   Activity Tolerance Patient tolerated treatment well   Behavior During Therapy [facility] for tasks assessed/performed      Past Medical History:  Diagnosis Date  . Essential hypertension 10/19/2007   03-2010: metoprolol changed to bystolic (was not feeling well on it, no specific allergy or reaction)    . Headache(784.0)    developed migraines after 2nd pregnancy, had a negative CT of the head  . Hypertension   . Stroke [facility])     Past Surgical History:  Procedure Laterality Date  . ABDOMINAL HYSTERECTOMY  11-14-07   no oophorectomy per surgical report  . CESAREAN SECTION     [id number]  . INGUINAL HERNIA REPAIR     2004    There were no vitals filed for this visit.      Subjective Assessment - 08/11/16 1726    Subjective  Pt s/p CVA 04/01/16 presents with left flaccid hemiplegia   Pertinent History see Epic   Currently in Pain? Yes   Pain Location Shoulder   Pain Orientation Left   Pain Descriptors / Indicators Aching   Pain Type Chronic pain   Pain Onset More than a month ago   Pain Frequency Intermittent   Aggravating Factors  malpositioning   Pain Relieving Factors reposistioning           [facility] OT Assessment - 08/11/16 1416      Assessment   Diagnosis R basal ganglia hemorrhage   Referring Provider Dr. Posey Pronto   Onset Date 04/01/16   Prior  Therapy CIR, HHOT     Precautions   Precautions Fall     Balance Screen   Has the patient fallen in the past 6 months Yes   How many times? 2   Has the patient had a decrease in activity level because of a fear of falling?  No   Is the patient reluctant to leave their home because of a fear of falling?  No     Home  Environment   Family/patient expects to be discharged to: Private residence   Home Access --  split level, staying with her mom   Bathroom Shower/Tub Tub/Shower unit  has seat   Lives With --  mother     Prior Function   Level of Independence Independent   Vocation Full time employment   Leisure watch TV, sing, go out to eat     ADL   Eating/Feeding Minimal assistance  using right hand   Grooming Modified independent   Upper Body Bathing Minimal assistance   Lower Body Bathing Minimal assistance   Upper Body Dressing Maximal assistance  dependent with bra    Lower Body Dressing Maximal assistance   Toilet Tranfer Modified independent  has BSC, per pt report   Tub/Shower Transfer Minimal assistance   ADL comments Pt's mother assists with ADLs.  IADL   Shopping Completely unable to shop   Light Housekeeping Does not participate in any housekeeping tasks   Meal Prep Needs to have meals prepared and served   Medication Management Is responsible for taking medication in correct dosages at correct time   Financial Management Dependent     Mobility   Mobility Status Needs assist  has hemiwalker and w/c     Written Expression   Dominant Hand Left   Handwriting --  unable     Vision Assessment   Vision Assessment Vision not tested   Comment Pt reports she had double vision initally, but it is resolved     Activity Tolerance   Activity Tolerance Comments Pt reports that she tolerates sitting up x 30 mins then must go back to bed due to back pain     Cognition   Overall Cognitive Status Impaired/Different from baseline   Area of Impairment  Attention;Memory;Awareness   Current Attention Level Sustained   Attention Comments easily distracted   Awareness Intellectual   Attention Selective   Selective Attention Impaired   Memory Impaired   Memory Impairment Decreased short term memory     Sensation   Light Touch Impaired Detail   Light Touch Impaired Details Impaired LUE  trace sensation hand, moderate impairment arm     Coordination   Gross Motor Movements are Fluid and Coordinated No   Fine Motor Movements are Fluid and Coordinated No   Coordination and Movement Description no active movement in LUE     Tone   Assessment Location Left Upper Extremity     ROM / Strength   AROM / PROM / Strength PROM     AROM   Overall AROM  Deficits;Other (comment)  no active movement in LUE     PROM   Overall PROM  Deficits;Due to pain   Overall PROM Comments Pt demonstrates the following limitations in P/ROM for LUE : wrist ext grossly 50%, supination 75%, sh. flexion grossly 75*, Pt maintains fingers in flexion at rest due to spasticity,  Pt has a 2 digits width subluxation at left shoulder.     LUE Tone   LUE Tone Hypotonic;Flaccid     LUE Tone   Hypotonic Details overall for LUE with exception of hypertonicity in digits of left hand                           OT Short Term Goals - 08/11/16 1755      OT SHORT TERM GOAL #1   Title Pt/ caregiver will be I with HEP.   Time 6   Period Weeks   Status New     OT SHORT TERM GOAL #2   Title Pt/ family will verbalize understanding of LUE positioning to minimize pain and risk for injury, including splinting PRN.   Time 6   Period Weeks   Status New     OT SHORT TERM GOAL #3   Title Pt will donn shirt with min A.   Time 6   Period Weeks   Status New     OT SHORT TERM GOAL #4   Title Pt will demonstrate 90* P/ROM shoulder flexion in LUE with pain less than or equal to 3/10.   Time 6   Period Weeks     OT SHORT TERM GOAL #5   Title Pt will  perform simple snack prep modified independently.   Time 6   Period  Weeks   Status New     Additional Short Term Goals   Additional Short Term Goals Yes     OT SHORT TERM GOAL #6   Title Pt will perform bathing at a supervision level   Time 6   Period Weeks   Status New           OT Long Term Goals - 08/11/16 1759      OT LONG TERM GOAL #1   Title Pt will perform LB dressing with min A.   Time 12   Period Weeks   Status New     OT LONG TERM GOAL #2   Title Pt will use LUE as a stabilizer/ gross A with min A.   Time 12   Period Weeks   Status New     OT LONG TERM GOAL #3   Title Pt will perform simple home management tasks with min A.   Time 6   Period Weeks   Status New     OT LONG TERM GOAL #4   Title Pt will demonstrate adequate standing balance to retrieve a light weight object from overhead cabinet modified independently with RUE without LOB in prep for IADLS.   Time 12   Period Weeks   Status New     OT LONG TERM GOAL #5   Title Pt will perform shower transfers modified independently   Time 12   Period Weeks   Status New               Plan - 08/11/16 1750    Clinical Impression Statement Pt is a 45 y.o female with a hx of HTN, migraine headaches s/p basla ganglia hemorrhage on 04/01/16, Pt presents with left hemiplegia, sensory impairments, decreased balance, shoulder pain and cognitive deficits which impede performance of ADLs/IADLs. Pt can benefit from skilled occupational therapy to maximize independence with ADLs/ IADLS and to maintain quality of life.   Rehab Potential Good   Clinical Impairments Affecting Rehab Potential severity of deficits   OT Frequency 2x / week  plus eval   OT Duration 12 weeks   OT Treatment/Interventions Self-care/ADL training;Moist Heat;Fluidtherapy;DME and/or AE instruction;Splinting;Patient/family education;Balance Financial planner;Contrast Bath;Ultrasound;Therapeutic exercise;Therapeutic  activities;Cognitive remediation/compensation;Passive range of motion;Functional Mobility Training;Neuromuscular education;Cryotherapy;Electrical Stimulation;Parrafin;Energy conservation;Manual Therapy;Visual/perceptual remediation/compensation   Plan consider resting hand splint, initate HEP in supine   Consulted and Agree with Plan of Care Patient;Family member/caregiver   Family Member Consulted mother , husband      Patient will benefit from skilled therapeutic intervention in order to improve the following deficits and impairments:  Abnormal gait, Decreased coordination, Decreased range of motion, Difficulty walking, Impaired flexibility, Impaired sensation, Increased edema, Decreased safety awareness, Decreased endurance, Decreased activity tolerance, Decreased knowledge of precautions, Increased fascial restricitons, Impaired tone, Pain, Impaired UE functional use, Decreased knowledge of use of DME, Decreased balance, Decreased cognition, Decreased mobility, Decreased strength, Impaired perceived functional ability, Impaired vision/preception  Visit Diagnosis: Other lack of coordination  Pain in left shoulder  Hemiplegia and hemiparesis following other nontraumatic intracranial hemorrhage affecting left dominant side (HCC)  Unsteadiness on feet  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 Left flaccid hemiplegia ([place]) 06/24/2016  . Ear itching   . Poor fluid intake   . Bradycardia, drug induced   . Poor nutrition   . Muscle spasticity   . Obesity (BMI 30.0-34.9) 04/08/2016  . Hyperlipidemia 04/08/2016  .  Basal ganglia hemorrhage ([place]) 04/08/2016  . Aphasia as late effect of cerebrovascular accident   . Dysphagia as late effect of cerebrovascular disease   . Migraine with aura and without status migrainosus, not intractable   . HLD (hyperlipidemia)   . Benign  essential HTN   . Gait disturbance, post-stroke   . Hemiplegia, post-stroke ([place])   . Hypokalemia   . Dysphagia, post-stroke   . Aphasia, post-stroke   . Bradycardia   . Headache, migraine   . Dysarthria, post-stroke   . Encephalopathy acute 04/02/2016  . ICH (intracerebral hemorrhage) (HCC) - R basal ganglia due to hypertensive emergency 04/01/2016  . Upper airway cough syndrome 12/06/2015  . PCP NOTES >>> 09/25/2015  . Need for hepatitis B vaccination 10/19/2014  . Insomnia 12/28/2012  . Annual physical exam 11/23/2011  . Hemorrhoids 10/13/2011  . Morbid obesity ([place]) 02/26/2011  . CHEST PAIN UNSPECIFIED 03/28/2010  . Essential hypertension 10/19/2007  . Headache(784.0) 05/12/2007    RINE,KATHRYN 08/12/2016, 4:17 PM Theone Murdoch, OTR/L [phone] Phone: [phone] 4:18 PM 08/12/16 [facility] [address] [place], [place], [address] Phone: [phone]   Fax:  [phone]  Name: Abigail Campos MRN: [id number] Date of Birth: June 15, 1971

## 2016-08-13 ENCOUNTER — Encounter
Payer: Managed Care, Other (non HMO) | Attending: Physical Medicine & Rehabilitation | Admitting: Physical Medicine & Rehabilitation

## 2016-08-13 ENCOUNTER — Encounter: Payer: Self-pay | Admitting: Physical Medicine & Rehabilitation

## 2016-08-13 VITALS — BP 106/72 | HR 58 | Resp 14

## 2016-08-13 DIAGNOSIS — R0989 Other specified symptoms and signs involving the circulatory and respiratory systems: Secondary | ICD-10-CM | POA: Diagnosis not present

## 2016-08-13 DIAGNOSIS — Z823 Family history of stroke: Secondary | ICD-10-CM | POA: Diagnosis not present

## 2016-08-13 DIAGNOSIS — G43909 Migraine, unspecified, not intractable, without status migrainosus: Secondary | ICD-10-CM | POA: Insufficient documentation

## 2016-08-13 DIAGNOSIS — G8114 Spastic hemiplegia affecting left nondominant side: Secondary | ICD-10-CM

## 2016-08-13 DIAGNOSIS — R252 Cramp and spasm: Secondary | ICD-10-CM | POA: Diagnosis not present

## 2016-08-13 DIAGNOSIS — I610 Nontraumatic intracerebral hemorrhage in hemisphere, subcortical: Secondary | ICD-10-CM | POA: Diagnosis not present

## 2016-08-13 DIAGNOSIS — F419 Anxiety disorder, unspecified: Secondary | ICD-10-CM | POA: Diagnosis not present

## 2016-08-13 DIAGNOSIS — Z8249 Family history of ischemic heart disease and other diseases of the circulatory system: Secondary | ICD-10-CM | POA: Insufficient documentation

## 2016-08-13 DIAGNOSIS — I69959 Hemiplegia and hemiparesis following unspecified cerebrovascular disease affecting unspecified side: Secondary | ICD-10-CM | POA: Diagnosis not present

## 2016-08-13 DIAGNOSIS — R42 Dizziness and giddiness: Secondary | ICD-10-CM | POA: Diagnosis not present

## 2016-08-13 DIAGNOSIS — R630 Anorexia: Secondary | ICD-10-CM | POA: Insufficient documentation

## 2016-08-13 DIAGNOSIS — R200 Anesthesia of skin: Secondary | ICD-10-CM | POA: Insufficient documentation

## 2016-08-13 DIAGNOSIS — G8191 Hemiplegia, unspecified affecting right dominant side: Secondary | ICD-10-CM | POA: Insufficient documentation

## 2016-08-13 DIAGNOSIS — I10 Essential (primary) hypertension: Secondary | ICD-10-CM | POA: Insufficient documentation

## 2016-08-13 DIAGNOSIS — G8194 Hemiplegia, unspecified affecting left nondominant side: Secondary | ICD-10-CM | POA: Diagnosis not present

## 2016-08-13 DIAGNOSIS — R269 Unspecified abnormalities of gait and mobility: Secondary | ICD-10-CM

## 2016-08-13 DIAGNOSIS — I69398 Other sequelae of cerebral infarction: Secondary | ICD-10-CM

## 2016-08-13 DIAGNOSIS — Z833 Family history of diabetes mellitus: Secondary | ICD-10-CM | POA: Diagnosis not present

## 2016-08-13 MED ORDER — BACLOFEN 10 MG PO TABS: 10.0000 mg | ORAL_TABLET | Freq: Three times a day (TID) | ORAL | 0 refills | Status: DC

## 2016-08-13 NOTE — Progress Notes (Addendum)
Subjective:    Patient ID: Abigail Campos, female    DOB: 01-Apr-1971, 45 y.o.   MRN: [id number]  HPI  45 year old right-handed female with history of hypertension, migraine headaches presents for follow up after right basal ganglia hemorrhage.   Last clinic visit 06/10/16.  She saw the Neurologist since last visit.  She is still in therapies.  She is still wearing her PRAFO.  Overall, she states she has been okay.  She has some increasing spasticity and started on Baclofen by Neurology. She is anxious to walk again.   Pain Inventory Average Pain 0 Pain Right Now 0 My pain is no pain  In the last 24 hours, has pain interfered with the following? General activity 0 Relation with others 0 Enjoyment of life 0 What TIME of day is your pain at its worst? no pain Sleep (in general) Good  Pain is worse with: some activites Pain improves with: heat/ice and medication Relief from Meds: 5  Mobility walk with assistance use a walker use a wheelchair transfers alone  Function disabled: date disabled 04/01/2016 I need assistance with the following:  dressing, meal prep, household duties and shopping  Neuro/Psych spasms dizziness  Prior Studies Any changes since last visit?  no  Physicians involved in your care Any changes since last visit?  no   Family History  Problem Relation Age of Onset  . Diabetes Mother   . Hypertension Mother   . Glaucoma Mother   . Colon cancer Mother     mother age 79  . Stroke Paternal Grandfather   . Heart disease      2 uncles died 2011/03/27 from CAD-CHF  . Stroke Maternal Grandfather   . Diabetes Other     siblings  . Hypertension Other     siblings   Social History   Social History  . Marital status: Married    Spouse name: N/A  . Number of children: 2  . Years of education: N/A   Occupational History  . [employer]   Social History Main Topics  . Smoking status: Never Smoker  . Smokeless tobacco: Never Used  .  Alcohol use No  . Drug use: No  . Sexual activity: Not Asked   Other Topics Concern  . None   Social History Narrative   Used to leve independently, stroke 03/26/16, d/c from rehab to his mother's house 04-2016   Past Surgical History:  Procedure Laterality Date  . ABDOMINAL HYSTERECTOMY  11-14-07   no oophorectomy per surgical report  . CESAREAN SECTION     [id number]  . INGUINAL HERNIA REPAIR     03-27-03   Past Medical History:  Diagnosis Date  . Essential hypertension 10/19/2007   March 26, 2010: metoprolol changed to bystolic (was not feeling well on it, no specific allergy or reaction)    . Headache(784.0)    developed migraines after 2nd pregnancy, had a negative CT of the head  . Hypertension   . Stroke ([place])    BP 106/72 (BP Location: Right Arm, Patient Position: Sitting, Cuff Size: Normal)   Pulse (!) 58   Resp 14   SpO2 98%   Opioid Risk Score:   Fall Risk Score:  `1  Depression screen PHQ 2/9  Depression screen [facility] 2/9 08/13/2016 06/10/2016 05/15/2016 09/25/2015 07/17/2015  Decreased Interest 0 0 0 0 0  Down, Depressed, Hopeless 0 0 1 0 0  PHQ - 2 Score 0 0 1 0 0  Altered sleeping - -  1 - -  Tired, decreased energy - - 3 - -  Change in appetite - - 0 - -  Feeling bad or failure about yourself  - - 0 - -  Trouble concentrating - - 0 - -  Moving slowly or fidgety/restless - - 0 - -  Suicidal thoughts - - 0 - -  PHQ-9 Score - - 5 - -    Review of Systems  Skin: Positive for rash.  Neurological: Positive for facial asymmetry, speech difficulty and weakness. Negative for numbness.  All other systems reviewed and are negative.     Objective:   Physical Exam Constitutional: She appears well-developed.  NAD HENT:  Normocephalic and atraumatic.   Eyes: Conjunctivae and EOM are normal.     Cardiovascular: Normal rate and regular rhythm.    Respiratory: Effort normal and breath sounds normal. No respiratory distress.   GI: Soft. Bowel sounds are normal. She exhibits no  distension.  Musculoskeletal: She exhibits no edema or no tenderness.   Pain with ROM of left shoulder Neurological: She is Alert and oriented 3.  Expressive difficulties with increased speed Dysarthria basically resolved. Left facial weakness Motor: RUE/RLE: 5/5 proximal to distal.   Left upper/lower extremity: 0/5.  Modified [place] 3/4 left finger flexors, wrist flexors, ankle plantar flexors.  DTRs 3+ LUE/LLE Skin: Skin is warm and dry.  Psychiatric: Smiling, cooperative    Assessment & Plan:  45 year old right-handed female with history of hypertension, migraine headaches presents for follow up after right basal ganglia hemorrhage.    1. Hemiplegia late effect of right basal ganglia hemorrhage  Cont therapies  Cont meds  Cont Follow up Neurology  Cont braces, hand-wrist AFO, PRAFO  Will d/c fluoxetine  2. Migraines  ?Resolved  3. Poor appetite  Improved  4. HTN: Improving  Cont to improve  Currently WNL  Cont lisinopril 2.5 on [id number], Bysystolic 5 mg on 5/9 due to questionable left ventricular hypertrophy  5. Spastic hemiplegia affecting left non-dominant side   Mainly in left finger flexors  Had lengthy discussion with patient regarding treatment options for spasticity  Will increase baclofen 10mg  TID  Will consider Botox injection in future if pt amenable  Will request authorization for Botox  Time spent with patient 38min, >18min in counseling

## 2016-08-21 ENCOUNTER — Encounter: Payer: Managed Care, Other (non HMO) | Admitting: Internal Medicine

## 2016-08-23 ENCOUNTER — Other Ambulatory Visit: Payer: Self-pay | Admitting: Internal Medicine

## 2016-08-24 ENCOUNTER — Ambulatory Visit: Payer: Managed Care, Other (non HMO) | Attending: Physical Medicine & Rehabilitation

## 2016-08-24 DIAGNOSIS — R278 Other lack of coordination: Secondary | ICD-10-CM | POA: Insufficient documentation

## 2016-08-24 DIAGNOSIS — I69115 Cognitive social or emotional deficit following nontraumatic intracerebral hemorrhage: Secondary | ICD-10-CM | POA: Diagnosis present

## 2016-08-24 DIAGNOSIS — I69015 Cognitive social or emotional deficit following nontraumatic subarachnoid hemorrhage: Secondary | ICD-10-CM

## 2016-08-24 DIAGNOSIS — I69352 Hemiplegia and hemiparesis following cerebral infarction affecting left dominant side: Secondary | ICD-10-CM | POA: Diagnosis present

## 2016-08-24 DIAGNOSIS — M25512 Pain in left shoulder: Secondary | ICD-10-CM | POA: Insufficient documentation

## 2016-08-24 DIAGNOSIS — I69321 Dysphasia following cerebral infarction: Secondary | ICD-10-CM | POA: Insufficient documentation

## 2016-08-24 DIAGNOSIS — I69320 Aphasia following cerebral infarction: Secondary | ICD-10-CM | POA: Insufficient documentation

## 2016-08-24 DIAGNOSIS — R471 Dysarthria and anarthria: Secondary | ICD-10-CM

## 2016-08-24 DIAGNOSIS — R41841 Cognitive communication deficit: Secondary | ICD-10-CM | POA: Insufficient documentation

## 2016-08-24 DIAGNOSIS — R2689 Other abnormalities of gait and mobility: Secondary | ICD-10-CM | POA: Diagnosis present

## 2016-08-24 NOTE — Patient Instructions (Addendum)
Purpose: To improve lip and tongue strength in order to help people understand you. These exercises can be done while looking in a mirror.  Do them twice a day.  Lips  1. Press hard and briefly hold all the beginning sounds in these words:  Repeat two times     "ma ma ma"    "boo boo boo"  "pa pa pa."          "[place]"  "bye bye bye"  "pie pie pie"          "me me me"  "bee bee bee"  "pea pea pea"   2. Pucker your lips tightly and say "OOOO," then smile wide and say "EEEE."   Repeat 20 times  3. Practice whistling for __10__ seconds.    4. "Blow out" candles.  Repeat 20 times.   5. Blow kisses - make them LOUD!  Repeat 20 times.    Tip of Tongue:  Repeat everything twice  1. Say the sound   "ta ta ta,"   "la la la,"          and "Engineering geologist."                                  "tee tee tee" "lee lee lee"   "dee dee dee"          "too too too" "[place]"  "do do do"  2. Say "time," "took," "take," "town," and "Tom."    3. Say "long," "look," "low," "lay" and "lie."    4. Say "name," "neck," "not," "new," and "no."    5. Say "down," "dip," "dot," "Don," and "do."    Back of Tongue:   Repeat everything twice  1. Sat the sound  "ka ka ka" and "go go go."          "cow cow cow" "guy guy guy"        "koo koo koo" "goo goo goo"  2. Say "kick," "cake," "Anda Kraft," "key," "keep," "kite," and "Ken."    3. Say "gate," "gag," "got," "get," "gone," and "go."

## 2016-08-24 NOTE — Therapy (Addendum)
[facility] [address] [place], [place], [address] Phone: [phone]   Fax:  [phone]  Speech Language Pathology Treatment  Patient Details  Name: Abigail Campos MRN: [id number] Date of Birth: 08-02-1971 Referring Provider: Delice Lesch, M.D.  Encounter Date: 08/24/2016      End of Session - 08/24/16 1402    Visit Number 2   Number of Visits 16   Date for SLP Re-Evaluation 10/23/16   Authorization Type Glendell Docker asked mom to find out exactly how many sessions were used by [facility], on 08-24-16   Authorization - Number of Visits 240   SLP Start Time [id number]   SLP Stop Time  1401   SLP Time Calculation (min) 43 min      Past Medical History:  Diagnosis Date  . Essential hypertension 10/19/2007   03-2010: metoprolol changed to bystolic (was not feeling well on it, no specific allergy or reaction)    . Headache(784.0)    developed migraines after 2nd pregnancy, had a negative CT of the head  . Hypertension   . Stroke [facility])     Past Surgical History:  Procedure Laterality Date  . ABDOMINAL HYSTERECTOMY  11-14-07   no oophorectomy per surgical report  . CESAREAN SECTION     [id number]  . INGUINAL HERNIA REPAIR     2004    There were no vitals filed for this visit.      Subjective Assessment - 08/24/16 1322    Subjective Pt agreed that her speech exhibited dysarthria.    Currently in Pain? No/denies               ADULT SLP TREATMENT - 08/24/16 1323      General Information   Behavior/Cognition Alert;Cooperative     Treatment Provided   Treatment provided Cognitive-Linquistic     Cognitive-Linquistic Treatment   Treatment focused on Dysarthria   Skilled Treatment Pt reports she has paragraphs at home that her HHSLP provided to give her practice at rate reduction. SLP assessed formally pt's oral motor skills. Decr'd lt labial and lingual musculature strength and ROM  was noted, WNL rt sided strength  and ROM of lingual and labial musculature. SLP educated pt on labial and lingual strengthening HEP with mother present. Pt req'd  mod A usually for producing the initial consonant sounds with strength.      Assessment / Recommendations / Plan   Plan Goals updated  to reflect HEP for dysarthria     Progression Toward Goals   Progression toward goals Progressing toward goals          SLP Education - 08/24/16 1402    Education provided Yes   Education Details HEP (dysarthria)   Person(s) Educated Patient;Parent(s)   Methods Explanation;Demonstration;Verbal cues;Handout   Comprehension Verbalized understanding;Returned demonstration;Verbal cues required;Need further instruction          SLP Short Term Goals - 08/24/16 1332      SLP SHORT TERM GOAL #1   Title pt will name 3 deficit areas over three sessions   Time 4   Period Weeks   Status On-going     SLP SHORT TERM GOAL #2   Title pt will demo selective attention to therapy tasks for 10 minutes in min-mod noisy environment   Time 4   Period Weeks   Status On-going     SLP SHORT TERM GOAL #3   Title pt will complete tasks of simple cognitive linguistic organization with 90% success  and rare min A   Time 4   Period Weeks   Status On-going     SLP SHORT TERM GOAL #4   Title pt will attempt to self correct misarticulated speech in 50% of opportunities in structured speech tasks   Time 4   Period Weeks   Status On-going          SLP Long Term Goals - 08/24/16 1332      SLP LONG TERM GOAL #1   Title pt will demo alternating attention in simple cognitive linguistic tasks with appropriate switch times, with compensations   Time 8   Period Weeks   Status On-going     SLP LONG TERM GOAL #2   Title pt will demo emergent awareness in cognitive linguistic tasks by self correcting errors in 80% of opportunities with nonverbal cues   Time 8   Period Weeks   Status On-going     SLP LONG TERM GOAL #3   Title pt will  demo functional organization in mod complex linguistic tasks with modified independence   Time 8   Period Weeks   Status On-going     SLP LONG TERM GOAL #4   Title pt will demo appropriate rate of speech in 5 minutes simple to mod complex conversation with occasional nonverbal cues   Time 8   Period Weeks   Status On-going     SLP LONG TERM GOAL #5   Title pt will demo HEP for dysarthria with min A   Time 8   Period Weeks   Status On-going          Plan - 08/24/16 1404    Clinical Impression Statement Pt presents with mod cognitive linguistic deficits including planning/organization (executive function), awareness, and attention. SLP assessed pt's oral motor skills today and added HEP for dysarthria into pt's LTGs. SLP provided pt with HEP for dysarthria and educated pt/mother re: proper completion. Skilled ST needed to decr caregiver burden and return pt to greater level of independence.   Speech Therapy Frequency 2x / week   Duration --  8 weeks   Treatment/Interventions Compensatory strategies;Patient/family education;Functional tasks;Cueing hierarchy;Cognitive reorganization;Internal/external aids;SLP instruction and feedback;Language facilitation;Oral motor exercises  any and/or all may be used during sessions   Potential to Achieve Goals Good   Potential Considerations Severity of impairments   Consulted and Agree with Plan of Care Patient      Patient will benefit from skilled therapeutic intervention in order to improve the following deficits and impairments:   Cognitive social or emotional deficit following nontraumatic subarachnoid hemorrhage  Dysarthria and anarthria    Problem List Patient Active Problem List   Diagnosis Date Noted  . Left flaccid hemiplegia ([place]) 06/24/2016  . Ear itching   . Poor fluid intake   . Bradycardia, drug induced   . Poor nutrition   . Muscle spasticity   . Obesity (BMI 30.0-34.9) 04/08/2016  . Hyperlipidemia 04/08/2016  .  Basal ganglia hemorrhage ([place]) 04/08/2016  . Aphasia as late effect of cerebrovascular accident   . Dysphagia as late effect of cerebrovascular disease   . Migraine with aura and without status migrainosus, not intractable   . HLD (hyperlipidemia)   . Benign essential HTN   . Gait disturbance, post-stroke   . Hemiplegia, post-stroke ([place])   . Hypokalemia   . Dysphagia, post-stroke   . Aphasia, post-stroke   . Bradycardia   . Headache, migraine   . Dysarthria, post-stroke   . Encephalopathy acute  04/02/2016  . ICH (intracerebral hemorrhage) (HCC) - R basal ganglia due to hypertensive emergency 04/01/2016  . Upper airway cough syndrome 12/06/2015  . PCP NOTES >>> 09/25/2015  . Need for hepatitis B vaccination 10/19/2014  . Insomnia 12/28/2012  . Annual physical exam 11/23/2011  . Hemorrhoids 10/13/2011  . Morbid obesity ([place]) 02/26/2011  . CHEST PAIN UNSPECIFIED 03/28/2010  . Essential hypertension 10/19/2007  . Headache(784.0) 05/12/2007    [place] ,[place], [place]  08/24/2016, 2:06 PM  [facility] [address] [place], [place], [address] Phone: [phone]   Fax:  [phone]   Name: Abigail Campos MRN: [id number] Date of Birth: 05-25-1971

## 2016-08-26 ENCOUNTER — Ambulatory Visit: Payer: Managed Care, Other (non HMO) | Admitting: Physical Therapy

## 2016-08-26 DIAGNOSIS — R41841 Cognitive communication deficit: Secondary | ICD-10-CM | POA: Diagnosis not present

## 2016-08-26 DIAGNOSIS — I69352 Hemiplegia and hemiparesis following cerebral infarction affecting left dominant side: Secondary | ICD-10-CM

## 2016-08-26 DIAGNOSIS — R2689 Other abnormalities of gait and mobility: Secondary | ICD-10-CM

## 2016-08-26 DIAGNOSIS — Z0271 Encounter for disability determination: Secondary | ICD-10-CM

## 2016-08-26 NOTE — Patient Instructions (Signed)
Stroke Prevention °Some health problems and behaviors may make it more likely for you to have a stroke. Below are ways to lessen your risk of having a stroke.  °· Be active for at least 30 minutes on most or all days. °· Do not smoke. Try not to be around others who smoke. °· Do not drink too much alcohol. °¨ Do not have more than 2 drinks a day if you are a man. °¨ Do not have more than 1 drink a day if you are a woman and are not pregnant. °· Eat healthy foods, such as fruits and vegetables. If you were put on a specific diet, follow the diet as told. °· Keep your cholesterol levels under control through diet and medicines. Look for foods that are low in saturated fat, trans fat, cholesterol, and are high in fiber. °· If you have diabetes, follow all diet plans and take your medicine as told. °· Ask your doctor if you need treatment to lower your blood pressure. If you have high blood pressure (hypertension), follow all diet plans and take your medicine as told by your doctor. °· If you are 18-39 years old, have your blood pressure checked every 3-5 years. If you are age 40 or older, have your blood pressure checked every year. °· Keep a healthy weight. Eat foods that are low in calories, salt, saturated fat, trans fat, and cholesterol. °· Do not take drugs. °· Avoid birth control pills, if this applies. Talk to your doctor about the risks of taking birth control pills. °· Talk to your doctor if you have sleep problems (sleep apnea). °· Take all medicine as told by your doctor. °¨ You may be told to take aspirin or blood thinner medicine. Take this medicine as told by your doctor. °¨ Understand your medicine instructions. °· Make sure any other conditions you have are being taken care of. °GET HELP RIGHT AWAY IF: °· You suddenly lose feeling (you feel numb) or have weakness in your face, arm, or leg. °· Your face or eyelid hangs down to one side. °· You suddenly feel confused. °· You have trouble talking (aphasia)  or understanding what people are saying. °· You suddenly have trouble seeing in one or both eyes. °· You suddenly have trouble walking. °· You are dizzy. °· You lose your balance or your movements are clumsy (uncoordinated). °· You suddenly have a very bad headache and you do not know the cause. °· You have new chest pain. °· Your heart feels like it is fluttering or skipping a beat (irregular heartbeat). °Do not wait to see if the symptoms above go away. Get help right away. Call your local emergency services (911 in [place] Do not drive yourself to the hospital. °  °This information is not intended to replace advice given to you by your health care provider. Make sure you discuss any questions you have with your health care provider. °  °Document Released: 05/31/2012 Document Revised: 12/21/2014 Document Reviewed: 06/02/2013 °Elsevier Interactive Patient Education ©2016 Elsevier Inc. ° °

## 2016-08-26 NOTE — Therapy (Signed)
[facility] [address] [place], [place], [address] Phone: [phone]   Fax:  [phone]  Physical Therapy Treatment  Patient Details  Name: Abigail Campos MRN: [id number] Date of Birth: 09/15/1971 Referring Provider: Dr. Posey Pronto  Encounter Date: 08/26/2016      PT End of Session - 08/26/16 1116    Visit Number 2   Number of Visits 17   Date for PT Re-Evaluation 10/10/16   Authorization Type [employer]-240 visit limit? combine with HH-emailed Lattie Haw about visit number   PT Start Time 1010   PT Stop Time 1052   PT Time Calculation (min) 42 min   Activity Tolerance Patient tolerated treatment well   Behavior During Therapy [facility] for tasks assessed/performed      Past Medical History:  Diagnosis Date  . Essential hypertension 10/19/2007   03-2010: metoprolol changed to bystolic (was not feeling well on it, no specific allergy or reaction)    . Headache(784.0)    developed migraines after 2nd pregnancy, had a negative CT of the head  . Hypertension   . Stroke [facility])     Past Surgical History:  Procedure Laterality Date  . ABDOMINAL HYSTERECTOMY  11-14-07   no oophorectomy per surgical report  . CESAREAN SECTION     [id number]  . INGUINAL HERNIA REPAIR     2004    There were no vitals filed for this visit.      Subjective Assessment - 08/26/16 1012    Subjective "it's a lot going on in here.  It's hard to focus."  Pt states she is not sure if her mom has been able to find out about [facility] visits used yet.   Pertinent History HTN, hyperlipidemia, hx of migraine   Patient Stated Goals I want to walk and stand up without falling.    Currently in Pain? No/denies           [facility] Adult PT Treatment/Exercise - 08/26/16 0001      Transfers   Transfers Sit to Stand;Stand to Sit   Sit to Stand 4: Min guard;5: Supervision   Sit to Stand Details (indicate cue type and reason) significant weight bearing on R side with  transfer;   Stand to Sit 5: Supervision   Stand Pivot Transfers 5: Supervision;4: Min guard;With armrests   Stand Pivot Transfer Details (indicate cue type and reason) w/c<>mat, w/c<>chair and w/c<>raised toilet (pt needing to use bathroom during session)   Number of Reps Other reps (comment)  >10 times during session   Comments Uses R UE and weight bears significantly on RLE during transition and once standing     Standardized Balance Assessment   Standardized Balance Assessment Berg Balance Test     Berg Balance Test   Sit to Stand Able to stand  independently using hands   Standing Unsupported Able to stand 2 minutes with supervision  reports feeling slightly dizzy   Sitting with Back Unsupported but Feet Supported on Floor or Stool Able to sit safely and securely 2 minutes   Stand to Sit Sits safely with minimal use of hands   Transfers Able to transfer safely, definite need of hands   Standing Unsupported with Eyes Closed Needs help to keep from falling   Standing Ubsupported with Feet Together Needs help to attain position and unable to hold for 15 seconds   From Standing, Reach Forward with Outstretched Arm Loses balance while trying/requires external support   From Standing Position, Pick up  Object from Floor Unable to try/needs assist to keep balance   From Standing Position, Turn to Look Behind Over each Shoulder Needs assist to keep from losing balance and falling   Turn 360 Degrees Needs assistance while turning   Standing Unsupported, Alternately Place Feet on Step/Stool Needs assistance to keep from falling or unable to try   Standing Unsupported, One Foot in [place] balance while stepping or standing   Standing on One Leg Unable to try or needs assist to prevent fall   Total Score 17     Exercises   Exercises Knee/Hip;Ankle     Knee/Hip Exercises: Seated   Long Arc Quad Left;Strengthening;10 reps;Limitations   Long Arc Quad Limitations needs cues to achieve full  extension   Knee/Hip Flexion unable to march seated on L     Knee/Hip Exercises: Supine   Short Arc [employer] Left;Strengthening;10 reps;Other (comment)  5 second hold   Heel Slides AAROM;Left;10 reps   Heel Slides Limitations needs min assist to initiate then able to perform with "guiding" of LLE   Hip Adduction Isometric Both;10 reps   Bridges Both;10 reps   Single Leg Bridge Strengthening;Left;10 reps;Limitations  needs assist to prevent abduction of LLE   Other Supine Knee/Hip Exercises In hooklying for LLE hip abd/add x 10 AROM     Ankle Exercises: Seated   Other Seated Ankle Exercises No active ankle dorsiflexion or plantar flexion on L;PROM into dorsiflexion x 30 seconds                PT Education - 08/26/16 1115    Education provided Yes   Education Details Stroke prevention, asked pt to bring tennis shoes and hemiwalker on next visit to trial AFO's   Person(s) Educated Patient;Parent(s)   Methods Explanation;Handout   Comprehension Verbalized understanding          PT Short Term Goals - 08/11/16 1624      PT SHORT TERM GOAL #1   Title Pt will be IND in performing HEP to improve strength and balance. TARGET DATE FOR ALL STGS: 09/08/16   Status New     PT SHORT TERM GOAL #2   Title Perform BERG and write goals if appropriate.   Status New     PT SHORT TERM GOAL #3   Title Pt will amb. 200' over even terrain with LRAD at MOD I level to improve functional mobility.    Status New     PT SHORT TERM GOAL #4   Title Pt will improve gait speed to 0.28ft/sec with LRAD to decrease falls risk.    Status New           PT Long Term Goals - 08/11/16 1626      PT LONG TERM GOAL #1   Title Pt will verbalize understanding or CVA risk factors and signs/symptoms to reduce risk of additional CVA. TARGET DATE FOR ALL LTGS: 10/06/16   Status New     PT LONG TERM GOAL #2   Title Pt will improve gait speed to >/=1.4ft/sec. with LRAD to reduce falls risk.    Status  New     PT LONG TERM GOAL #3   Title Pt will amb. 400' over even/paved surfaces with LRAD at MOD I level to improve functional mobility.    Status New     PT LONG TERM GOAL #4   Title Pt will perform TUG with LRAD in </=30sec. to improve function and decr. falls risk.  Status New     PT LONG TERM GOAL #5   Title Pt will perform stand pivot txfs from chair to w/c at MOD I level to improve functional mobility.    Status New               Plan - 08/26/16 1117    Clinical Impression Statement Pt scored 17/56 on BERG.  Unsteady in static standing without UE support.  Continue PT per POC.   Rehab Potential Good   Clinical Impairments Affecting Rehab Potential co-morbidities   PT Frequency 2x / week   PT Duration 8 weeks   PT Treatment/Interventions ADLs/Self Care Home Management;Biofeedback;Canalith Repostioning;Electrical Stimulation;Cognitive remediation;Neuromuscular re-education;Balance training;Therapeutic exercise;Therapeutic activities;Functional mobility training;Stair training;Gait training;DME Instruction;Orthotic Fit/Training;Patient/family education;Vestibular;Manual techniques   PT Next Visit Plan Write BERG goal. Provided balance and strengthening HEP-start in supine and seated and add sit<>stand.  Gait with trials of L AFO if pt brings tennis shoes.   Consulted and Agree with Plan of Care Patient;Family member/caregiver   Family Member Consulted pt's mom: [name]      Patient will benefit from skilled therapeutic intervention in order to improve the following deficits and impairments:  Abnormal gait, Decreased endurance, Decreased knowledge of use of DME, Decreased strength, Impaired UE functional use, Impaired sensation, Decreased balance, Decreased mobility, Decreased range of motion, Decreased coordination, Impaired flexibility, Decreased cognition  Visit Diagnosis: Hemiplegia and hemiparesis following cerebral infarction affecting left dominant side (HCC)  Other  abnormalities of gait and mobility     Problem List Patient Active Problem List   Diagnosis Date Noted  . Left flaccid hemiplegia ([place]) 06/24/2016  . Ear itching   . Poor fluid intake   . Bradycardia, drug induced   . Poor nutrition   . Muscle spasticity   . Obesity (BMI 30.0-34.9) 04/08/2016  . Hyperlipidemia 04/08/2016  . Basal ganglia hemorrhage ([place]) 04/08/2016  . Aphasia as late effect of cerebrovascular accident   . Dysphagia as late effect of cerebrovascular disease   . Migraine with aura and without status migrainosus, not intractable   . HLD (hyperlipidemia)   . Benign essential HTN   . Gait disturbance, post-stroke   . Hemiplegia, post-stroke ([place])   . Hypokalemia   . Dysphagia, post-stroke   . Aphasia, post-stroke   . Bradycardia   . Headache, migraine   . Dysarthria, post-stroke   . Encephalopathy acute 04/02/2016  . ICH (intracerebral hemorrhage) (HCC) - R basal ganglia due to hypertensive emergency 04/01/2016  . Upper airway cough syndrome 12/06/2015  . PCP NOTES >>> 09/25/2015  . Need for hepatitis B vaccination 10/19/2014  . Insomnia 12/28/2012  . Annual physical exam 11/23/2011  . Hemorrhoids 10/13/2011  . Morbid obesity ([place]) 02/26/2011  . CHEST PAIN UNSPECIFIED 03/28/2010  . Essential hypertension 10/19/2007  . Headache(784.0) 05/12/2007    Narda Bonds 08/26/2016, [facility] [address] [place], [place], [address] Phone: [phone]   Fax:  [phone]  Name: FELICHA MOFFA MRN: [id number] Date of Birth: [birth year]  Narda Bonds, [place] 08/26/16 11:22 AM Phone: [phone] Fax: [phone]

## 2016-08-27 ENCOUNTER — Ambulatory Visit: Payer: Managed Care, Other (non HMO)

## 2016-08-27 DIAGNOSIS — R2689 Other abnormalities of gait and mobility: Secondary | ICD-10-CM

## 2016-08-27 DIAGNOSIS — I69352 Hemiplegia and hemiparesis following cerebral infarction affecting left dominant side: Secondary | ICD-10-CM

## 2016-08-27 DIAGNOSIS — R471 Dysarthria and anarthria: Secondary | ICD-10-CM

## 2016-08-27 DIAGNOSIS — I69015 Cognitive social or emotional deficit following nontraumatic subarachnoid hemorrhage: Secondary | ICD-10-CM

## 2016-08-27 DIAGNOSIS — R41841 Cognitive communication deficit: Secondary | ICD-10-CM | POA: Diagnosis not present

## 2016-08-27 NOTE — Patient Instructions (Signed)
Functional Quadriceps: Sit to Stand    Have someone there with you for safety. Have a sturdy chair in front of you and place a 1 inch book under right foot, in order to promote weight shifting onto Left leg. Sit on edge of chair, feet flat on floor. Stand upright, extending knees fully. Repeat __10__ times per set. Do __1__ sets per session. Do __1__ sessions per day.  http://orth.exer.us/735   Copyright  VHI. All rights reserved.   Bridge    Bring belly button to spine. Lie back, legs bent. Inhale, pressing hips up. Keeping ribs in, lengthen lower back, then brings hips back to mat/bed. Make sure to keep your left leg from going out to the side. Repeat _10___ times. Do __2__ sessions per day.   http://pm.exer.us/55   Copyright  VHI. All rights reserved.   Heel Slide    Place sheet around right foot and then bend right knee and pull heel toward buttocks. Hold _2___ seconds. Return. Repeat __10__ times. Do __2__ sessions per day.  http://gt2.exer.us/372   Copyright  VHI. All rights reserved.   Straight Leg Raise    Tighten stomach and slowly raise locked left leg __6__ inches from floor. Repeat __10__ times per set. Do __2__ sets per session. Do __1__ sessions per day.  http://orth.exer.us/1103   Copyright  VHI. All rights reserved.   Toe / Heel Raise (Sitting)    Sitting, raise heels, then rock back on heels and raise toes. Repeat __20__ times. Perform every day.  Copyright  VHI. All rights reserved.   ABDUCTION: Sitting (Active)    Sit with feet flat. Lift left leg slightly and draw it out to side. Then return to center. Complete _2__ sets of _10__ repetitions. Perform __1_ sessions per day.  Copyright  VHI. All rights reserved.

## 2016-08-27 NOTE — Therapy (Addendum)
[facility] [address] [place], [place], [address] Phone: [phone]   Fax:  [phone]  Speech Language Pathology Treatment  Patient Details  Name: Abigail Campos MRN: [id number] Date of Birth: Jun 24, 1971 Referring Provider: Delice Lesch, M.D.  Encounter Date: 08/27/2016      End of Session - 08/27/16 1230    Visit Number 3   Number of Visits 16   Date for SLP Re-Evaluation 10/23/16   Authorization - Number of Visits 240  total   SLP Start Time 1017   SLP Stop Time  1100   SLP Time Calculation (min) 43 min   Activity Tolerance Patient tolerated treatment well      Past Medical History:  Diagnosis Date  . Essential hypertension 10/19/2007   03-2010: metoprolol changed to bystolic (was not feeling well on it, no specific allergy or reaction)    . Headache(784.0)    developed migraines after 2nd pregnancy, had a negative CT of the head  . Hypertension   . Stroke [facility])     Past Surgical History:  Procedure Laterality Date  . ABDOMINAL HYSTERECTOMY  11-14-07   no oophorectomy per surgical report  . CESAREAN SECTION     [id number]  . INGUINAL HERNIA REPAIR     2004    There were no vitals filed for this visit.      Subjective Assessment - 08/27/16 1023    Subjective "It's (HEP) is helping me."   Currently in Pain? No/denies               ADULT SLP TREATMENT - 08/27/16 1026      General Information   Behavior/Cognition Alert;Cooperative     Treatment Provided   Treatment provided Cognitive-Linquistic     Cognitive-Linquistic Treatment   Treatment focused on Cognition   Skilled Treatment Intellectual awareness present only with SLP initial mod cues (other than "memory"). In picture sequencing tasks, SLP facilitated pt's abilities to sequencing, organize, and reason. Pt req'd occasional min cues for attention to detail and reasoning.     Assessment / Recommendations / Plan   Plan  Continue with current plan of care     Progression Toward Goals   Progression toward goals Progressing toward goals            SLP Short Term Goals - 08/27/16 1236      SLP SHORT TERM GOAL #1   Title pt will name 3 deficit areas over three sessions   Time 4   Period Weeks   Status On-going     SLP SHORT TERM GOAL #2   Title pt will demo selective attention to therapy tasks for 10 minutes in min-mod noisy environment   Time 4   Period Weeks   Status On-going     SLP SHORT TERM GOAL #3   Title pt will complete tasks of simple cognitive linguistic organization with 90% success and rare min A   Time 4   Period Weeks   Status On-going     SLP SHORT TERM GOAL #4   Title pt will attempt to self correct misarticulated speech in 50% of opportunities in structured speech tasks   Time 4   Period Weeks   Status On-going          SLP Long Term Goals - 08/27/16 1236      SLP LONG TERM GOAL #1   Title pt will demo alternating attention in simple cognitive linguistic tasks with appropriate switch  times, with compensations   Time 8   Period Weeks   Status On-going     SLP LONG TERM GOAL #2   Title pt will demo emergent awareness in cognitive linguistic tasks by self correcting errors in 80% of opportunities with nonverbal cues   Time 8   Period Weeks   Status On-going     SLP LONG TERM GOAL #3   Title pt will demo functional organization in mod complex linguistic tasks with modified independence   Time 8   Period Weeks   Status On-going     SLP LONG TERM GOAL #4   Title pt will demo appropriate rate of speech in 5 minutes simple to mod complex conversation with occasional nonverbal cues   Time 8   Period Weeks   Status On-going     SLP LONG TERM GOAL #5   Title pt will demo HEP for dysarthria with min A   Time 8   Period Weeks   Status On-going          Plan - 08/27/16 1233    Clinical Impression Statement Pt presents with mod cognitive linguistic deficits  including planning/organization (executive function), awareness, and attention. Skilled ST needed to decr caregiver burden and return pt to greater level of independence.   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 Left flaccid hemiplegia ([place]) 06/24/2016  . Ear itching   . Poor fluid intake   . Bradycardia, drug induced   . Poor nutrition   . Muscle spasticity   . Obesity (BMI 30.0-34.9) 04/08/2016  . Hyperlipidemia 04/08/2016  . Basal ganglia hemorrhage ([place]) 04/08/2016  . Aphasia as late effect of cerebrovascular accident   . Dysphagia as late effect of cerebrovascular disease   . Migraine with aura and without status migrainosus, not intractable   . HLD (hyperlipidemia)   . Benign essential HTN   . Gait disturbance, post-stroke   . Hemiplegia, post-stroke ([place])   . Hypokalemia   . Dysphagia, post-stroke   . Aphasia, post-stroke   . Bradycardia   . Headache, migraine   . Dysarthria, post-stroke   . Encephalopathy acute 04/02/2016  . ICH (intracerebral hemorrhage) (HCC) - R basal ganglia due to hypertensive emergency 04/01/2016  . Upper airway cough syndrome 12/06/2015  . PCP NOTES >>> 09/25/2015  . Need for hepatitis B vaccination 10/19/2014  .  Insomnia 12/28/2012  . Annual physical exam 11/23/2011  . Hemorrhoids 10/13/2011  . Morbid obesity ([place]) 02/26/2011  . CHEST PAIN UNSPECIFIED 03/28/2010  . Essential hypertension 10/19/2007  . Headache(784.0) 05/12/2007    [place] ,[place], [place]  08/27/2016, 12:39 PM  [facility] [address] [place], [place], [address] Phone: [phone]   Fax:  [phone]   Name: LORAH KESSELMAN MRN: [id number] Date of Birth: 16-Jun-1971

## 2016-08-27 NOTE — Progress Notes (Signed)
Order has been placed.

## 2016-08-27 NOTE — Patient Instructions (Signed)
  Please complete the assigned speech therapy homework and return it to your next session.  

## 2016-08-27 NOTE — Therapy (Signed)
[facility] [address] [place], [place], [address] Phone: [phone]   Fax:  [phone]  Physical Therapy Treatment  Patient Details  Name: Abigail Campos MRN: [id number] Date of Birth: 1971/02/22 Referring Provider: Dr. Posey Pronto  Encounter Date: 08/27/2016      PT End of Session - 08/27/16 1203    Visit Number 3   Number of Visits 17   Date for PT Re-Evaluation 10/10/16   Authorization Type [employer]-240 visit limit? combine with HH-emailed Lattie Haw about visit number   PT Start Time 1107  pt in bathroom   PT Stop Time 1147   PT Time Calculation (min) 40 min   Activity Tolerance Patient tolerated treatment well   Behavior During Therapy [facility] for tasks assessed/performed      Past Medical History:  Diagnosis Date  . Essential hypertension 10/19/2007   03-2010: metoprolol changed to bystolic (was not feeling well on it, no specific allergy or reaction)    . Headache(784.0)    developed migraines after 2nd pregnancy, had a negative CT of the head  . Hypertension   . Stroke [facility])     Past Surgical History:  Procedure Laterality Date  . ABDOMINAL HYSTERECTOMY  11-14-07   no oophorectomy per surgical report  . CESAREAN SECTION     [id number]  . INGUINAL HERNIA REPAIR     2004    There were no vitals filed for this visit.      Subjective Assessment - 08/26/16 1012    Subjective "it's a lot going on in here.  It's hard to focus."  Pt states she is not sure if her mom has been able to find out about [facility] visits used yet.   Pertinent History HTN, hyperlipidemia, hx of migraine   Patient Stated Goals I want to walk and stand up without falling.    Currently in Pain? No/denies         Therex: Pt performed strengthening HEP with cues for technique and S to min A for safety. Please see pt instructions for details.  Pt performed STS with 2" block under R LE to encourage weight shifting onto LLE.   Pt performed  w/c<>mat txf with min guard to S for safety. Cues for technique.                        PT Education - 08/27/16 1200    Education provided Yes   Education Details PT provided pt with strengthening HEP. PT also discussed L AFO consult with orthotist and PT requesting prescription from MD, pt agreeable.    Person(s) Educated Patient;Parent(s)   Methods Explanation;Demonstration;Tactile cues;Verbal cues;Handout   Comprehension Returned demonstration;Verbalized understanding          PT Short Term Goals - 08/27/16 1206      PT SHORT TERM GOAL #1   Title Pt will be IND in performing HEP to improve strength and balance. TARGET DATE FOR ALL STGS: 09/08/16   Status On-going     PT SHORT TERM GOAL #2   Title Perform BERG and write goals if appropriate.   Status Achieved     PT SHORT TERM GOAL #3   Title Pt will amb. 200' over even terrain with LRAD at MOD I level to improve functional mobility.    Status On-going     PT SHORT TERM GOAL #4   Title Pt will improve gait speed to 0.62ft/sec with LRAD to decrease falls risk.  Status On-going     PT SHORT TERM GOAL #5   Title Pt will improve BERG score to >/=21/56 to decr. falls risk.    Status New           PT Long Term Goals - 08/27/16 1207      PT LONG TERM GOAL #1   Title Pt will verbalize understanding or CVA risk factors and signs/symptoms to reduce risk of additional CVA. TARGET DATE FOR ALL LTGS: 10/06/16   Status On-going     PT LONG TERM GOAL #2   Title Pt will improve gait speed to >/=1.65ft/sec. with LRAD to reduce falls risk.    Status On-going     PT LONG TERM GOAL #3   Title Pt will amb. 400' over even/paved surfaces with LRAD at MOD I level to improve functional mobility.    Status On-going     PT LONG TERM GOAL #4   Title Pt will perform TUG with LRAD in </=30sec. to improve function and decr. falls risk.    Status On-going     PT LONG TERM GOAL #5   Title Pt will perform stand pivot  txfs from chair to w/c at MOD I level to improve functional mobility.    Status On-going     Additional Long Term Goals   Additional Long Term Goals Yes     PT LONG TERM GOAL #6   Title Pt will improve BERG score to >/=25/56 to decr. falls risk.    Status New               Plan - 08/27/16 1203    Clinical Impression Statement Pt continues to experience decr. LLE strength, especially distal musculature which causes impaired balance during all functional mobility. PT will assess gait with L AFO next session and request orthotist consult and AFO prescription from MD. Continue with POC.    Rehab Potential Good   Clinical Impairments Affecting Rehab Potential co-morbidities   PT Frequency 2x / week   PT Duration 8 weeks   PT Treatment/Interventions ADLs/Self Care Home Management;Biofeedback;Canalith Repostioning;Electrical Stimulation;Cognitive remediation;Neuromuscular re-education;Balance training;Therapeutic exercise;Therapeutic activities;Functional mobility training;Stair training;Gait training;DME Instruction;Orthotic Fit/Training;Patient/family education;Vestibular;Manual techniques   PT Next Visit Plan Gait with trials of L AFO if pt brings tennis shoes. Request orthotist consult with [facility] and check on AFO prescription.   PT Home Exercise Plan Strengthening HEP.    Consulted and Agree with Plan of Care Patient;Family member/caregiver   Family Member Consulted pt's mom: [name]      Patient will benefit from skilled therapeutic intervention in order to improve the following deficits and impairments:  Abnormal gait, Decreased endurance, Decreased knowledge of use of DME, Decreased strength, Impaired UE functional use, Impaired sensation, Decreased balance, Decreased mobility, Decreased range of motion, Decreased coordination, Impaired flexibility, Decreased cognition  Visit Diagnosis: Hemiplegia and hemiparesis following cerebral infarction affecting left dominant side  (HCC)  Other abnormalities of gait and mobility     Problem List Patient Active Problem List   Diagnosis Date Noted  . Left flaccid hemiplegia ([place]) 06/24/2016  . Ear itching   . Poor fluid intake   . Bradycardia, drug induced   . Poor nutrition   . Muscle spasticity   . Obesity (BMI 30.0-34.9) 04/08/2016  . Hyperlipidemia 04/08/2016  . Basal ganglia hemorrhage ([place]) 04/08/2016  . Aphasia as late effect of cerebrovascular accident   . Dysphagia as late effect of cerebrovascular disease   . Migraine with aura and without status  migrainosus, not intractable   . HLD (hyperlipidemia)   . Benign essential HTN   . Gait disturbance, post-stroke   . Hemiplegia, post-stroke ([place])   . Hypokalemia   . Dysphagia, post-stroke   . Aphasia, post-stroke   . Bradycardia   . Headache, migraine   . Dysarthria, post-stroke   . Encephalopathy acute 04/02/2016  . ICH (intracerebral hemorrhage) (HCC) - R basal ganglia due to hypertensive emergency 04/01/2016  . Upper airway cough syndrome 12/06/2015  . PCP NOTES >>> 09/25/2015  . Need for hepatitis B vaccination 10/19/2014  . Insomnia 12/28/2012  . Annual physical exam 11/23/2011  . Hemorrhoids 10/13/2011  . Morbid obesity ([place]) 02/26/2011  . CHEST PAIN UNSPECIFIED 03/28/2010  . Essential hypertension 10/19/2007  . Headache(784.0) 05/12/2007    Ameila Weldon L 08/27/2016, 12:08 PM  [facility] [address] [place], [place], [address] Phone: [phone]   Fax:  [phone]  Name: Abigail Campos MRN: [id number] Date of Birth: 1971-11-11   Geoffry Paradise, PT,DPT 08/27/16 12:10 PM Phone: [phone] Fax: [phone]

## 2016-08-28 ENCOUNTER — Telehealth: Payer: Self-pay

## 2016-08-28 NOTE — Telephone Encounter (Signed)
PT contacted MD and [facility] regarding L AFO prescription and orthotist consult. Dr. Posey Pronto sent prescription to [facility] and [name] from [facility] confirmed he will attend pt's 09/04/16 PT session and 2pm.   Geoffry Paradise, PT,DPT 08/28/16 11:09 AM Phone: [phone] Fax: [phone]

## 2016-08-31 ENCOUNTER — Encounter: Payer: Self-pay | Admitting: Internal Medicine

## 2016-08-31 ENCOUNTER — Ambulatory Visit (INDEPENDENT_AMBULATORY_CARE_PROVIDER_SITE_OTHER): Payer: Managed Care, Other (non HMO) | Admitting: Internal Medicine

## 2016-08-31 VITALS — BP 118/68 | HR 60 | Temp 98.2°F | Resp 14 | Ht 59.0 in | Wt 134.2 lb

## 2016-08-31 DIAGNOSIS — E785 Hyperlipidemia, unspecified: Secondary | ICD-10-CM | POA: Diagnosis not present

## 2016-08-31 DIAGNOSIS — Z23 Encounter for immunization: Secondary | ICD-10-CM

## 2016-08-31 DIAGNOSIS — I10 Essential (primary) hypertension: Secondary | ICD-10-CM | POA: Diagnosis not present

## 2016-08-31 DIAGNOSIS — R739 Hyperglycemia, unspecified: Secondary | ICD-10-CM | POA: Diagnosis not present

## 2016-08-31 DIAGNOSIS — D649 Anemia, unspecified: Secondary | ICD-10-CM | POA: Diagnosis not present

## 2016-08-31 DIAGNOSIS — F329 Major depressive disorder, single episode, unspecified: Secondary | ICD-10-CM

## 2016-08-31 DIAGNOSIS — F32A Depression, unspecified: Secondary | ICD-10-CM

## 2016-08-31 LAB — IBC PANEL
Iron: 50 ug/dL (ref 42–145)
Saturation Ratios: 17.6 % — ABNORMAL LOW (ref 20.0–50.0)
Transferrin: 203 mg/dL — ABNORMAL LOW (ref 212.0–360.0)

## 2016-08-31 LAB — CBC WITH DIFFERENTIAL/PLATELET
Basophils Absolute: 0 10*3/uL (ref 0.0–0.1)
Basophils Relative: 0.3 % (ref 0.0–3.0)
Eosinophils Absolute: 0 10*3/uL (ref 0.0–0.7)
Eosinophils Relative: 0.3 % (ref 0.0–5.0)
HCT: 36.1 % (ref 36.0–46.0)
Hemoglobin: 12 g/dL (ref 12.0–15.0)
Lymphocytes Relative: 29.5 % (ref 12.0–46.0)
Lymphs Abs: 1.7 10*3/uL (ref 0.7–4.0)
MCHC: 33.2 g/dL (ref 30.0–36.0)
MCV: 90.5 fl (ref 78.0–100.0)
Monocytes Absolute: 0.4 10*3/uL (ref 0.1–1.0)
Monocytes Relative: 7.6 % (ref 3.0–12.0)
Neutro Abs: 3.5 10*3/uL (ref 1.4–7.7)
Neutrophils Relative %: 62.3 % (ref 43.0–77.0)
Platelets: 181 10*3/uL (ref 150.0–400.0)
RBC: 3.99 Mil/uL (ref 3.87–5.11)
RDW: 14.3 % (ref 11.5–15.5)
WBC: 5.6 10*3/uL (ref 4.0–10.5)

## 2016-08-31 LAB — BASIC METABOLIC PANEL
BUN: 13 mg/dL (ref 6–23)
CO2: 32 mEq/L (ref 19–32)
Calcium: 9.2 mg/dL (ref 8.4–10.5)
Chloride: 103 mEq/L (ref 96–112)
Creatinine, Ser: 0.81 mg/dL (ref 0.40–1.20)
GFR: 99.33 mL/min (ref >60.00)
Glucose, Bld: 79 mg/dL (ref 70–99)
Potassium: 3.9 mEq/L (ref 3.5–5.1)
Sodium: 139 mEq/L (ref 135–145)

## 2016-08-31 LAB — HEMOGLOBIN A1C: Hgb A1c MFr Bld: 5.4 % (ref 4.6–6.5)

## 2016-08-31 LAB — AST: AST: 16 U/L (ref 0–37)

## 2016-08-31 LAB — ALT: ALT: 28 U/L (ref 0–35)

## 2016-08-31 LAB — FERRITIN: Ferritin: 93.2 ng/mL (ref 10.0–291.0)

## 2016-08-31 NOTE — Progress Notes (Signed)
Subjective:    Patient ID: Abigail Campos, female    DOB: 1971/07/25, 45 y.o.   MRN: [id number]  DOS:  08/31/2016 Type of visit - description : rov, here w/ her mother Interval history:  Reports no major concerns. Medication list reviewed: Good compliance. Neurology note reviewed, was recommended Plavix. She continue working hard on physical therapy. Fluoxetine was discontinued by her physical therapy physician.  Review of Systems   Past Medical History:  Diagnosis Date  . Essential hypertension 10/19/2007   03-2010: metoprolol changed to bystolic (was not feeling well on it, no specific allergy or reaction)    . Headache(784.0)    developed migraines after 2nd pregnancy, had a negative CT of the head  . Hypertension   . Stroke [facility])     Past Surgical History:  Procedure Laterality Date  . ABDOMINAL HYSTERECTOMY  11-14-07   no oophorectomy per surgical report  . CESAREAN SECTION     [id number]  . INGUINAL HERNIA REPAIR     2004    Social History   Social History  . Marital status: Married    Spouse name: N/A  . Number of children: 2  . Years of education: N/A   Occupational History  . [employer]   Social History Main Topics  . Smoking status: Never Smoker  . Smokeless tobacco: Never Used  . Alcohol use No  . Drug use: No  . Sexual activity: Not on file   Other Topics Concern  . Not on file   Social History Narrative   Used to leve independently, stroke 03-2016, d/c from rehab to his mother's house 04-2016        Medication List       Accurate as of 08/31/16 11:28 AM. Always use your most recent med list.          atorvastatin 20 MG tablet Commonly known as:  LIPITOR Take 1 tablet (20 mg total) by mouth daily at 6 PM.   baclofen 10 MG tablet Commonly known as:  LIORESAL Take 1 tablet (10 mg total) by mouth 3 (three) times daily.   butalbital-acetaminophen-caffeine 50-325-40 MG tablet Commonly known as:  FIORICET, ESGIC Take 1  tablet by mouth every 4 (four) hours as needed for headache.   clopidogrel 75 MG tablet Commonly known as:  PLAVIX Take 1 tablet (75 mg total) by mouth daily.   famotidine 20 MG tablet Commonly known as:  PEPCID One at bedtime   furosemide 20 MG tablet Commonly known as:  LASIX Take 1 tablet (20 mg total) by mouth daily.   lisinopril 2.5 MG tablet Commonly known as:  PRINIVIL,ZESTRIL Take 1 tablet (2.5 mg total) by mouth daily.   nebivolol 5 MG tablet Commonly known as:  BYSTOLIC Take 1 tablet (5 mg total) by mouth every 12 (twelve) hours.   pantoprazole 40 MG tablet Commonly known as:  PROTONIX TAKE 1 TABLET BY MOUTH EVERY DAY 30-50 MINUTES BEFORE FIRST MEAL OF THE DAY   VITAMIN D PO Take 1 tablet by mouth daily.          Objective:   Physical Exam BP 118/68 (BP Location: Right Arm, Patient Position: Sitting, Cuff Size: Normal)   Pulse 60   Temp 98.2 F (36.8 C) (Oral)   Resp 14   Ht 4\' 11"  (1.499 m)   Wt 134 lb 4 oz (60.9 kg)   SpO2 97%   BMI 27.12 kg/m   General:   Well developed, in a  [place] in no apparent distress HEENT:  Normocephalic . Face symmetric, atraumatic Neck: [place]  midline, no LADss Lungs:  CTA B Normal respiratory effort, no intercostal retractions, no accessory muscle use. Heart: RRR,  no murmur.  No pretibial edema bilaterally  Skin: Not pale. Not jaundice Neurologic:  alert & oriented X3.  Speech seems fluid but has some difficulty with word finding. Motor: 0/5 upper and lower left extremities Psych--  Cooperative with normal attention span and concentration.  Behavior appropriate. No anxious or depressed appearing.       Assessment & Plan:  Assessment > Prediabetes: A1c 6.0 (April 2017)  HTN Depression GERD Stroke 03-2016: ICH, right basilar ganglia due to HTN emergency,  encephalopathy, + dysphagia, aphasia, dysarthria Headaches , migraines dx after 2nd pregnancy, (-) CT head 2014 and 2015 Insomnia  Cough,  persisting, saw Dr. Melvyn Novas 11-2015  PLAN: Prediabetes: Check an A1c HTN: BP at home consistently 118/80s. Continue Lasix, Prinivil and bystolic. Check a BMP High cholesterol- Well-controlled, check LFTs. Mild anemia: Check a CBC, iron panel and ferritin. Depression: judt d/c  fluoxetine as recommended by her PT physician, rec. to watch and see how she's doing, will restart fluoxetine if needed GI: On Protonix and Pepcid, recommend to stop Protonix and observe for symptoms. 1 care--------flu shot today, declined a pneumonia shot.  RTC 4 months

## 2016-08-31 NOTE — Progress Notes (Signed)
Pre visit review using our clinic review tool, if applicable. No additional management support is needed unless otherwise documented below in the visit note. 

## 2016-08-31 NOTE — Patient Instructions (Addendum)
   GO TO THE LAB : Get the blood work     GO TO THE FRONT DESK Schedule your next appointment for a  checkup in 4 months, fasting    Check the  blood pressure 2 or 3 times a month week monthly weekly daily Be sure your blood pressure is between 110/65 and  135/85. If it is consistently higher or lower, let me know

## 2016-09-01 ENCOUNTER — Ambulatory Visit: Payer: Managed Care, Other (non HMO)

## 2016-09-01 DIAGNOSIS — R41841 Cognitive communication deficit: Secondary | ICD-10-CM | POA: Diagnosis not present

## 2016-09-01 DIAGNOSIS — R2689 Other abnormalities of gait and mobility: Secondary | ICD-10-CM

## 2016-09-01 DIAGNOSIS — I69015 Cognitive social or emotional deficit following nontraumatic subarachnoid hemorrhage: Secondary | ICD-10-CM

## 2016-09-01 DIAGNOSIS — I69352 Hemiplegia and hemiparesis following cerebral infarction affecting left dominant side: Secondary | ICD-10-CM

## 2016-09-01 DIAGNOSIS — R471 Dysarthria and anarthria: Secondary | ICD-10-CM

## 2016-09-01 NOTE — Assessment & Plan Note (Signed)
Prediabetes: Check an A1c HTN: BP at home consistently 118/80s. Continue Lasix, Prinivil and bystolic. Check a BMP High cholesterol- Well-controlled, check LFTs. Mild anemia: Check a CBC, iron panel and ferritin. Depression: judt d/c  fluoxetine as recommended by her PT physician, rec. to watch and see how she's doing, will restart fluoxetine if needed GI: On Protonix and Pepcid, recommend to stop Protonix and observe for symptoms. 1 care--------flu shot today, declined a pneumonia shot.  RTC 4 months

## 2016-09-01 NOTE — Therapy (Addendum)
[facility] [address] [place], [place], [address] Phone: [phone]   Fax:  [phone]  Speech Language Pathology Treatment  Patient Details  Name: Abigail Campos MRN: [id number] Date of Birth: Feb 15, 1971 Referring Provider: Delice Lesch, M.D.  Encounter Date: 09/01/2016      End of Session - 09/01/16 1318    Visit Number 4   Number of Visits 16   Date for SLP Re-Evaluation 10/23/16   Authorization - Visit Number --  24 prior to [facility]   SLP Start Time [id number]   SLP Stop Time  [id number]   SLP Time Calculation (min) 41 min   Activity Tolerance Patient tolerated treatment well      Past Medical History:  Diagnosis Date  . Essential hypertension 10/19/2007   03-2010: metoprolol changed to bystolic (was not feeling well on it, no specific allergy or reaction)    . Headache(784.0)    developed migraines after 2nd pregnancy, had a negative CT of the head  . Hypertension   . Stroke [facility])     Past Surgical History:  Procedure Laterality Date  . ABDOMINAL HYSTERECTOMY  11-14-07   no oophorectomy per surgical report  . CESAREAN SECTION     [id number]  . INGUINAL HERNIA REPAIR     2004    There were no vitals filed for this visit.      Subjective Assessment - 09/01/16 1154    Subjective Pt expressed frustration with homework, being unable to recall information from 3-sentence paragraph in order to answer questions.   Patient is accompained by: --  Husband, [name]   Currently in Pain? No/denies               ADULT SLP TREATMENT - 09/01/16 1156      General Information   Behavior/Cognition Alert;Cooperative     Treatment Provided   Treatment provided Cognitive-Linquistic     Cognitive-Linquistic Treatment   Treatment focused on Cognition   Skilled Treatment Sustained/selective attention targeted today with letter fill-in task. Pt with extra time needed with each task. Pt req'd min A for problem  solving, but emergent awareness appeared WNL for this simple task.  SLP educated pt's husband on how to work on attention and cue pt for problem solving as well as attention at home.     Assessment / Recommendations / Plan   Plan Continue with current plan of care     Progression Toward Goals   Progression toward goals Progressing toward goals          SLP Education - 09/01/16 1317    Education provided Yes   Education Details husband educated on how to cue pt at home with attention and problem solving at home   Person(s) Educated Patient   Methods Explanation;Demonstration;Verbal cues   Comprehension Verbalized understanding          SLP Short Term Goals - 09/01/16 1319      SLP SHORT TERM GOAL #1   Title pt will name 3 deficit areas over three sessions   Time 3   Period Weeks   Status On-going     SLP SHORT TERM GOAL #2   Title pt will demo selective attention to therapy tasks for 10 minutes in min-mod noisy environment   Time 3   Period Weeks   Status On-going     SLP SHORT TERM GOAL #3   Title pt will complete tasks of simple cognitive linguistic organization with 90% success and  rare min A   Time 3   Period Weeks   Status On-going     SLP SHORT TERM GOAL #4   Title pt will attempt to self correct misarticulated speech in 50% of opportunities in structured speech tasks   Time 3   Period Weeks   Status On-going          SLP Long Term Goals - 09/01/16 1319      SLP LONG TERM GOAL #1   Title pt will demo alternating attention in simple cognitive linguistic tasks with appropriate switch times, with compensations   Time 7   Period Weeks   Status On-going     SLP LONG TERM GOAL #2   Title pt will demo emergent awareness in cognitive linguistic tasks by self correcting errors in 80% of opportunities with nonverbal cues   Time 7   Period Weeks   Status On-going     SLP LONG TERM GOAL #3   Title pt will demo functional organization in mod complex  linguistic tasks with modified independence   Time 7   Period Weeks   Status On-going     SLP LONG TERM GOAL #4   Title pt will demo appropriate rate of speech in 5 minutes simple to mod complex conversation with occasional nonverbal cues   Time 7   Period Weeks   Status On-going     SLP LONG TERM GOAL #5   Title pt will demo HEP for dysarthria with min A   Time 7   Period Weeks   Status On-going          Plan - 09/01/16 1319    Clinical Impression Statement Pt continues with mod cognitive linguistic deficits including planning/organization (executive function), awareness, and attention. Skilled ST needed to decr caregiver burden and return pt to greater level of independence.   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 Left flaccid hemiplegia ([place]) 06/24/2016  . Ear itching   . Poor fluid intake   . Bradycardia, drug induced   . Poor nutrition   . Muscle spasticity   . Obesity (BMI 30.0-34.9) 04/08/2016  . Hyperlipidemia 04/08/2016  . Basal ganglia hemorrhage ([place]) 04/08/2016  . Aphasia as late effect of cerebrovascular accident   . Dysphagia as late effect of cerebrovascular disease   . Migraine with aura and without status migrainosus, not  intractable   . HLD (hyperlipidemia)   . Benign essential HTN   . Gait disturbance, post-stroke   . Hemiplegia, post-stroke ([place])   . Hypokalemia   . Dysphagia, post-stroke   . Aphasia, post-stroke   . Bradycardia   . Headache, migraine   . Dysarthria, post-stroke   . Encephalopathy acute 04/02/2016  . ICH (intracerebral hemorrhage) (HCC) - R basal ganglia due to hypertensive emergency 04/01/2016  . Upper airway cough syndrome 12/06/2015  . PCP NOTES >>> 09/25/2015  .  Need for hepatitis B vaccination 10/19/2014  . Insomnia 12/28/2012  . Annual physical exam 11/23/2011  . Hemorrhoids 10/13/2011  . Morbid obesity ([place]) 02/26/2011  . CHEST PAIN UNSPECIFIED 03/28/2010  . Essential hypertension 10/19/2007  . Headache(784.0) 05/12/2007    Alexsus Papadopoulos ,[place], [place]  09/01/2016, 1:20 PM  [facility] [address] [place], [place], [address] Phone: [phone]   Fax:  [phone]   Name: Abigail Campos MRN: [id number] Date of Birth: 08/24/1971

## 2016-09-01 NOTE — Patient Instructions (Signed)
  Please complete the assigned speech therapy homework and return it to your next session.  

## 2016-09-01 NOTE — Therapy (Signed)
[facility] [address] [place], [place], [address] Phone: [phone]   Fax:  [phone]  Physical Therapy Treatment  Patient Details  Name: Abigail Campos MRN: [id number] Date of Birth: 03/30/1971 Referring Provider: Dr. Posey Pronto  Encounter Date: 09/01/2016      PT End of Session - 09/01/16 1213    Visit Number 4   Number of Visits 17   Date for PT Re-Evaluation 10/10/16   Authorization Type [employer]-240 visit limit? combine with HH-emailed Lattie Haw about visit number   PT Start Time 1101   PT Stop Time 1144   PT Time Calculation (min) 43 min   Equipment Utilized During Treatment Gait belt   Activity Tolerance Patient tolerated treatment well   Behavior During Therapy [facility] for tasks assessed/performed      Past Medical History:  Diagnosis Date  . Essential hypertension 10/19/2007   03-2010: metoprolol changed to bystolic (was not feeling well on it, no specific allergy or reaction)    . Headache(784.0)    developed migraines after 2nd pregnancy, had a negative CT of the head  . Hypertension   . Stroke [facility])     Past Surgical History:  Procedure Laterality Date  . ABDOMINAL HYSTERECTOMY  11-14-07   no oophorectomy per surgical report  . CESAREAN SECTION     [id number]  . INGUINAL HERNIA REPAIR     2004    There were no vitals filed for this visit.      Subjective Assessment - 09/01/16 1106    Subjective Pt reported she was sore after exercises, but it didn't last long. Pt reported MD ceased Prozac, and she feels sad.    Patient is accompained by: Family member  husband: [place]   Pertinent History HTN, hyperlipidemia, hx of migraine   Patient Stated Goals I want to walk and stand up without falling.    Currently in Pain? No/denies                         [facility] Adult PT Treatment/Exercise - 09/01/16 1108      Ambulation/Gait   Ambulation/Gait Yes   Ambulation/Gait Assistance 4: Min  guard   Ambulation/Gait Assistance Details Pt amb. with L toe AFO (with and without 1/4" heel wedge to decr. L genu recurvatum) and L Ottobock Walk On. Pt noted to experience improved L toe clearance with AFO. Cues for sequencing, improved upright posture, and decr. intermittent narrow BOS.    Ambulation Distance (Feet) 40 Feet  x2 c toe off (heel wedge) 25'x2 c toe off, 25'x2 c ottobock   Assistive device Hemi-walker   Gait Pattern Step-to pattern;Decreased stride length;Decreased stance time - left;Decreased step length - right;Decreased dorsiflexion - left;Left circumduction;Left genu recurvatum   Ambulation Surface Level;Indoor     Exercises   Exercises Knee/Hip;Ankle     Knee/Hip Exercises: Standing   Terminal Knee Extension Strengthening;Left;1 set;10 reps;Theraband   Theraband Level (Terminal Knee Extension) Level 1 (Yellow)   Terminal Knee Extension Limitations Cues and demo for technique, cues to decr. post. wt. shifting to assist L knee ext and to improve upright posture. PT will re-attempt next session.                 PT Education - 09/01/16 1211    Education provided Yes   Education Details PT discussed the benefits of different AFO types and heel wedge to reduce L genu recurvatum. PT informed pt that [name], from  [facility] (orthotist), will be present next session for orthotic consult. PT educated pt that L toe clearance is important but we also need to decr. L genu recurvatum.    Person(s) Educated Patient;Spouse   Methods Explanation   Comprehension Verbalized understanding          PT Short Term Goals - 08/27/16 1206      PT SHORT TERM GOAL #1   Title Pt will be IND in performing HEP to improve strength and balance. TARGET DATE FOR ALL STGS: 09/08/16   Status On-going     PT SHORT TERM GOAL #2   Title Perform BERG and write goals if appropriate.   Status Achieved     PT SHORT TERM GOAL #3   Title Pt will amb. 200' over even terrain with LRAD at MOD I level  to improve functional mobility.    Status On-going     PT SHORT TERM GOAL #4   Title Pt will improve gait speed to 0.64ft/sec with LRAD to decrease falls risk.    Status On-going     PT SHORT TERM GOAL #5   Title Pt will improve BERG score to >/=21/56 to decr. falls risk.    Status New           PT Long Term Goals - 08/27/16 1207      PT LONG TERM GOAL #1   Title Pt will verbalize understanding or CVA risk factors and signs/symptoms to reduce risk of additional CVA. TARGET DATE FOR ALL LTGS: 10/06/16   Status On-going     PT LONG TERM GOAL #2   Title Pt will improve gait speed to >/=1.86ft/sec. with LRAD to reduce falls risk.    Status On-going     PT LONG TERM GOAL #3   Title Pt will amb. 400' over even/paved surfaces with LRAD at MOD I level to improve functional mobility.    Status On-going     PT LONG TERM GOAL #4   Title Pt will perform TUG with LRAD in </=30sec. to improve function and decr. falls risk.    Status On-going     PT LONG TERM GOAL #5   Title Pt will perform stand pivot txfs from chair to w/c at MOD I level to improve functional mobility.    Status On-going     Additional Long Term Goals   Additional Long Term Goals Yes     PT LONG TERM GOAL #6   Title Pt will improve BERG score to >/=25/56 to decr. falls risk.    Status New               Plan - 09/01/16 1214    Clinical Impression Statement Pt demonstrated improved L toe clearance with ottobock and toe off AFOs. However, pt continues to experience L genu recurvatum in stance, and 1/4" heel wedge in L shoe did not decr. genu recurvatum. L toe off AFO provide incr. ankle stabilization vs. L ottobock walk on. Pt may need custom AFO or thicker heel wedge to decr. genu recurvatum. Pt continues to require rest breaks 2/2 fatigue. Continue with POC.    Rehab Potential Good   Clinical Impairments Affecting Rehab Potential co-morbidities   PT Frequency 2x / week   PT Duration 8 weeks   PT  Treatment/Interventions ADLs/Self Care Home Management;Biofeedback;Canalith Repostioning;Electrical Stimulation;Cognitive remediation;Neuromuscular re-education;Balance training;Therapeutic exercise;Therapeutic activities;Functional mobility training;Stair training;Gait training;DME Instruction;Orthotic Fit/Training;Patient/family education;Vestibular;Manual techniques   PT Next Visit Plan [name] with [facility] will be present for orthotic  consult. Trial L terminal knee ext again, and give as HEP if tolerated. PT provided yellow theraband to pt last session.    PT Home Exercise Plan Strengthening HEP.    Consulted and Agree with Plan of Care Patient;Family member/caregiver   Family Member Consulted pt's mom: [name]      Patient will benefit from skilled therapeutic intervention in order to improve the following deficits and impairments:  Abnormal gait, Decreased endurance, Decreased knowledge of use of DME, Decreased strength, Impaired UE functional use, Impaired sensation, Decreased balance, Decreased mobility, Decreased range of motion, Decreased coordination, Impaired flexibility, Decreased cognition  Visit Diagnosis: Other abnormalities of gait and mobility  Hemiplegia and hemiparesis following cerebral infarction affecting left dominant side [facility])     Problem List Patient Active Problem List   Diagnosis Date Noted  . Left flaccid hemiplegia ([place]) 06/24/2016  . Ear itching   . Poor fluid intake   . Bradycardia, drug induced   . Poor nutrition   . Muscle spasticity   . Obesity (BMI 30.0-34.9) 04/08/2016  . Hyperlipidemia 04/08/2016  . Basal ganglia hemorrhage ([place]) 04/08/2016  . Aphasia as late effect of cerebrovascular accident   . Dysphagia as late effect of cerebrovascular disease   . Migraine with aura and without status migrainosus, not intractable   . HLD (hyperlipidemia)   . Benign essential HTN   . Gait disturbance, post-stroke   . Hemiplegia, post-stroke ([place])   .  Hypokalemia   . Dysphagia, post-stroke   . Aphasia, post-stroke   . Bradycardia   . Headache, migraine   . Dysarthria, post-stroke   . Encephalopathy acute 04/02/2016  . ICH (intracerebral hemorrhage) (HCC) - R basal ganglia due to hypertensive emergency 04/01/2016  . Upper airway cough syndrome 12/06/2015  . PCP NOTES >>> 09/25/2015  . Need for hepatitis B vaccination 10/19/2014  . Insomnia 12/28/2012  . Annual physical exam 11/23/2011  . Hemorrhoids 10/13/2011  . Morbid obesity ([place]) 02/26/2011  . CHEST PAIN UNSPECIFIED 03/28/2010  . Essential hypertension 10/19/2007  . Headache(784.0) 05/12/2007    Ripley Lovecchio L 09/01/2016, 12:18 PM  [facility] [address] [place], [place], [address] Phone: [phone]   Fax:  [phone]  Name: MAELEY SUESS MRN: [id number] Date of Birth: 05-13-1971   Geoffry Paradise, PT,DPT 09/01/16 12:19 PM Phone: [phone] Fax: [phone]

## 2016-09-03 ENCOUNTER — Ambulatory Visit: Payer: Managed Care, Other (non HMO)

## 2016-09-03 DIAGNOSIS — I69015 Cognitive social or emotional deficit following nontraumatic subarachnoid hemorrhage: Secondary | ICD-10-CM

## 2016-09-03 DIAGNOSIS — R471 Dysarthria and anarthria: Secondary | ICD-10-CM

## 2016-09-03 DIAGNOSIS — R41841 Cognitive communication deficit: Secondary | ICD-10-CM | POA: Diagnosis not present

## 2016-09-03 NOTE — Therapy (Addendum)
[facility] [address] [place], [place], [address] Phone: [phone]   Fax:  [phone]  Speech Language Pathology Treatment  Patient Details  Name: Abigail Campos MRN: [id number] Date of Birth: [birth year] Referring Provider: Delice Lesch, M.D.  Encounter Date: 09/03/2016      End of Session - 09/03/16 1704    Visit Number 5   Number of Visits 16   Date for SLP Re-Evaluation 10/23/16   Authorization - Visit Number --  24 prior to [place] - Number of Visits 240   SLP Start Time [id number]   SLP Stop Time  [id number]   SLP Time Calculation (min) 41 min   Activity Tolerance Patient tolerated treatment well      Past Medical History:  Diagnosis Date  . Essential hypertension 10/19/2007   03-2010: metoprolol changed to bystolic (was not feeling well on it, no specific allergy or reaction)    . Headache(784.0)    developed migraines after 2nd pregnancy, had a negative CT of the head  . Hypertension   . Stroke [facility])     Past Surgical History:  Procedure Laterality Date  . ABDOMINAL HYSTERECTOMY  11-14-07   no oophorectomy per surgical report  . CESAREAN SECTION     [id number]  . INGUINAL HERNIA REPAIR     2004    There were no vitals filed for this visit.      Subjective Assessment - 09/03/16 1411    Subjective The homework went better due to pt reading the paragraph twice. "I feel like I'm a little off today."   Currently in Pain? No/denies               ADULT SLP TREATMENT - 09/03/16 1414      General Information   Behavior/Cognition Alert;Cooperative;Distractible     Treatment Provided   Treatment provided Cognitive-Linquistic     Pain Assessment   Pain Assessment No/denies pain     Cognitive-Linquistic Treatment   Treatment focused on Cognition   Skilled Treatment (cog skills: 31 minutes)SLP worked with pt's attention and other cognitive linguistic skills to improve cognitive  communication. Six-step sequences were used to facilitate attention as well as organization and reasoning. Pt organized these simple sequences indpendently, however, with rushed speech resulting in artic errors that went un-noticed by pt. (Speech tx): pt was educated re: her speech of reduced fluidity "I never noticed my speech was like that." SLP facilitated improved speech fluency and intelligibility by practicing at the sentence level with rate reduction. Pt was approx 50% successful with keeping rate slow at sentence level.      Assessment / Recommendations / Plan   Plan Continue with current plan of care     Progression Toward Goals   Progression toward goals Progressing toward goals          SLP Education - 09/03/16 1657    Education provided Yes   Education Details speech deficits/differences, compensations   Person(s) Educated Patient   Methods Explanation;Demonstration   Comprehension Verbalized understanding;Verbal cues required;Need further instruction          SLP Short Term Goals - 09/03/16 1707      SLP SHORT TERM GOAL #1   Title pt will name 3 deficit areas over three sessions   Time 3   Period Weeks   Status On-going     SLP SHORT TERM GOAL #2   Title pt will demo selective attention to therapy tasks for  10 minutes in min-mod noisy environment   Time 3   Period Weeks   Status On-going     SLP SHORT TERM GOAL #3   Title pt will complete tasks of simple cognitive linguistic organization with 90% success and rare min A   Time 3   Period Weeks   Status On-going     SLP SHORT TERM GOAL #4   Title pt will attempt to self correct misarticulated speech in 50% of opportunities in structured speech tasks   Time 3   Period Weeks   Status On-going          SLP Long Term Goals - 09/03/16 1707      SLP LONG TERM GOAL #1   Title pt will demo alternating attention in simple cognitive linguistic tasks with appropriate switch times, with compensations   Time 7    Period Weeks   Status On-going     SLP LONG TERM GOAL #2   Title pt will demo emergent awareness in cognitive linguistic tasks by self correcting errors in 80% of opportunities with nonverbal cues   Time 7   Period Weeks   Status On-going     SLP LONG TERM GOAL #3   Title pt will demo functional organization in mod complex linguistic tasks with modified independence   Time 7   Period Weeks   Status On-going     SLP LONG TERM GOAL #4   Title pt will demo appropriate rate of speech in 5 minutes simple to mod complex conversation with occasional nonverbal cues   Time 7   Period Weeks   Status On-going     SLP LONG TERM GOAL #5   Title pt will demo HEP for dysarthria with min A   Time 7   Period Weeks   Status On-going          Plan - 09/03/16 1705    Clinical Impression Statement Pt continues with mod cognitive linguistic deficits including planning/organization (executive function), awareness, and attention. SLP educated pt today how to compensate for speech errors, exacerbated by decr'd cognitive communication skills. Skilled ST needed to decr caregiver burden and return pt to greater level of independence.   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 Left flaccid hemiplegia ([place]) 06/24/2016  . Ear itching   . Poor fluid intake   . Bradycardia, drug induced   . Poor nutrition   . Muscle spasticity   . Obesity (BMI 30.0-34.9) 04/08/2016  . Hyperlipidemia 04/08/2016  . Basal ganglia hemorrhage ([place]) 04/08/2016  . Aphasia as late effect of cerebrovascular accident   . Dysphagia as late effect of cerebrovascular disease   . Migraine with aura and without status migrainosus, not intractable   . HLD (hyperlipidemia)   . Benign essential HTN   . Gait disturbance, post-stroke   . Hemiplegia, post-stroke ([place])   .  Hypokalemia   . Dysphagia, post-stroke   . Aphasia, post-stroke   . Bradycardia   . Headache, migraine   . Dysarthria, post-stroke   . Encephalopathy acute 04/02/2016  . ICH (intracerebral hemorrhage) (HCC) - R basal ganglia due to hypertensive emergency 04/01/2016  . Upper airway cough syndrome 12/06/2015  . PCP NOTES >>> 09/25/2015  . Need for hepatitis B vaccination 10/19/2014  . Insomnia 12/28/2012  . Annual physical exam 11/23/2011  . Hemorrhoids 10/13/2011  . Morbid obesity ([place]) 02/26/2011  . CHEST PAIN UNSPECIFIED 03/28/2010  . Essential hypertension 10/19/2007  . Headache(784.0) 05/12/2007    [place] ,Joaquin, [place]  09/03/2016, 5:08 PM  [facility] [address] [place], [place], [address] Phone: [phone]   Fax:  [phone]   Name: Abigail Campos MRN: [id number] Date of Birth: 03/24/1971

## 2016-09-04 ENCOUNTER — Ambulatory Visit: Payer: Managed Care, Other (non HMO)

## 2016-09-04 DIAGNOSIS — R2689 Other abnormalities of gait and mobility: Secondary | ICD-10-CM

## 2016-09-04 DIAGNOSIS — R41841 Cognitive communication deficit: Secondary | ICD-10-CM | POA: Diagnosis not present

## 2016-09-04 DIAGNOSIS — I69352 Hemiplegia and hemiparesis following cerebral infarction affecting left dominant side: Secondary | ICD-10-CM

## 2016-09-04 NOTE — Therapy (Signed)
Abigail Campos [address] [place], [place], [address] Phone: [phone]   Fax:  [phone]  Physical Therapy Treatment  Patient Details  Name: CECILY Campos MRN: [id number] Date of Birth: 11/11/1971 Referring Provider: Dr. Posey Pronto  Encounter Date: 09/04/2016      PT End of Session - 09/04/16 1444    Visit Number 5   Number of Visits 17   Date for PT Re-Evaluation 10/10/16   Authorization Type [employer]-240 visit limit? combine with HH-emailed Abigail Campos about visit number   PT Start Time 1400   PT Stop Time 1441   PT Time Calculation (min) 41 min   Equipment Utilized During Treatment Gait belt   Activity Tolerance Patient tolerated treatment well   Behavior During Therapy [facility] for tasks assessed/performed      Past Medical History:  Diagnosis Date  . Essential hypertension 10/19/2007   03-2010: metoprolol changed to bystolic (was not feeling well on it, no specific allergy or reaction)    . Headache(784.0)    developed migraines after 2nd pregnancy, had a negative CT of the head  . Hypertension   . Stroke [facility])     Past Surgical History:  Procedure Laterality Date  . ABDOMINAL HYSTERECTOMY  11-14-07   no oophorectomy per surgical report  . CESAREAN SECTION     [id number]  . INGUINAL HERNIA REPAIR     2004    There were no vitals filed for this visit.      Subjective Assessment - 09/04/16 1427    Subjective Pt denied pain or changes since last visit.    Patient is accompained by: Family member  husband: Abigail Campos   Patient Stated Goals I want to walk and stand up without falling.    Currently in Pain? No/denies                         [facility] Adult PT Treatment/Exercise - 09/04/16 1429      Ambulation/Gait   Ambulation/Gait Yes   Ambulation/Gait Assistance 4: Min guard   Ambulation/Gait Assistance Details With orthotist present, Abigail Campos, from [place] present. Cues to decr. L genu recurvatum, and  to improve L heel strike and upright posture.   Ambulation Distance (Feet) 40 Feet   Assistive device Hemi-walker   Gait Pattern Step-to pattern;Decreased stride length;Decreased stance time - left;Decreased step length - right;Decreased dorsiflexion - left;Left circumduction;Left genu recurvatum   Ambulation Surface Level;Indoor     Exercises   Exercises Knee/Hip;Ankle     Knee/Hip Exercises: Standing   Terminal Knee Extension Strengthening;Left;1 set;Theraband;15 reps   Theraband Level (Terminal Knee Extension) Level 1 (Yellow)   Terminal Knee Extension Limitations Cues and demo for technique, cues to decr. post. wt. shifting to assist L knee ext and to improve upright posture.     Please see pt instructions for details.             PT Education - 09/04/16 1443    Education provided Yes   Education Details PT and orthotist education pt on L AFO custom solid AFO to decr. L genu recurvatum and process in obtaining AFO. PT provided pt with knee ext HEP.    Person(s) Educated Patient;Spouse   Methods Explanation;Tactile cues;Verbal cues;Handout   Comprehension Returned demonstration;Verbalized understanding          PT Short Term Goals - 08/27/16 1206      PT SHORT TERM GOAL #1   Title Pt will be IND  in performing HEP to improve strength and balance. TARGET DATE FOR ALL STGS: 09/08/16   Status On-going     PT SHORT TERM GOAL #2   Title Perform BERG and write goals if appropriate.   Status Achieved     PT SHORT TERM GOAL #3   Title Pt will amb. 200' over even terrain with LRAD at MOD I level to improve functional mobility.    Status On-going     PT SHORT TERM GOAL #4   Title Pt will improve gait speed to 0.39ft/sec with LRAD to decrease falls risk.    Status On-going     PT SHORT TERM GOAL #5   Title Pt will improve BERG score to >/=21/56 to decr. falls risk.    Status New           PT Long Term Goals - 08/27/16 1207      PT LONG TERM GOAL #1   Title Pt  will verbalize understanding or CVA risk factors and signs/symptoms to reduce risk of additional CVA. TARGET DATE FOR ALL LTGS: 10/06/16   Status On-going     PT LONG TERM GOAL #2   Title Pt will improve gait speed to >/=1.42ft/sec. with LRAD to reduce falls risk.    Status On-going     PT LONG TERM GOAL #3   Title Pt will amb. 400' over even/paved surfaces with LRAD at MOD I level to improve functional mobility.    Status On-going     PT LONG TERM GOAL #4   Title Pt will perform TUG with LRAD in </=30sec. to improve function and decr. falls risk.    Status On-going     PT LONG TERM GOAL #5   Title Pt will perform stand pivot txfs from chair to w/c at MOD I level to improve functional mobility.    Status On-going     Additional Long Term Goals   Additional Long Term Goals Yes     PT LONG TERM GOAL #6   Title Pt will improve BERG score to >/=25/56 to decr. falls risk.    Status New               Plan - 09/04/16 1444    Clinical Impression Statement Skilled PT session focused on orthotist consult and gait training with L AFO. Pt continues to experiences L genu recurvatum without AFO donned and with off the shelf L AFO, and would benefit from custom solid L AFO to decr. genu recurvatum. Continue with POC.    Rehab Potential Good   Clinical Impairments Affecting Rehab Potential co-morbidities   PT Frequency 2x / week   PT Duration 8 weeks   PT Treatment/Interventions ADLs/Self Care Home Management;Biofeedback;Canalith Repostioning;Electrical Stimulation;Cognitive remediation;Neuromuscular re-education;Balance training;Therapeutic exercise;Therapeutic activities;Functional mobility training;Stair training;Gait training;DME Instruction;Orthotic Fit/Training;Patient/family education;Vestibular;Manual techniques   PT Next Visit Plan Check STGs. Gait training with control of L terminal knee ext. Balance and strength training.    PT Home Exercise Plan Strengthening HEP.    Consulted  and Agree with Plan of Care Patient;Family member/caregiver   Family Member Consulted pt's mom: Abigail Campos      Patient will benefit from skilled therapeutic intervention in order to improve the following deficits and impairments:  Abnormal gait, Decreased endurance, Decreased knowledge of use of DME, Decreased strength, Impaired UE functional use, Impaired sensation, Decreased balance, Decreased mobility, Decreased range of motion, Decreased coordination, Impaired flexibility, Decreased cognition  Visit Diagnosis: Other abnormalities of gait and mobility  Hemiplegia and  hemiparesis following cerebral infarction affecting left dominant side [facility])     Problem List Patient Active Problem List   Diagnosis Date Noted  . Left flaccid hemiplegia ([place]) 06/24/2016  . Ear itching   . Poor fluid intake   . Bradycardia, drug induced   . Poor nutrition   . Muscle spasticity   . Obesity (BMI 30.0-34.9) 04/08/2016  . Hyperlipidemia 04/08/2016  . Basal ganglia hemorrhage ([place]) 04/08/2016  . Aphasia as late effect of cerebrovascular accident   . Dysphagia as late effect of cerebrovascular disease   . Migraine with aura and without status migrainosus, not intractable   . HLD (hyperlipidemia)   . Benign essential HTN   . Gait disturbance, post-stroke   . Hemiplegia, post-stroke ([place])   . Hypokalemia   . Dysphagia, post-stroke   . Aphasia, post-stroke   . Bradycardia   . Headache, migraine   . Dysarthria, post-stroke   . Encephalopathy acute 04/02/2016  . ICH (intracerebral hemorrhage) (HCC) - R basal ganglia due to hypertensive emergency 04/01/2016  . Upper airway cough syndrome 12/06/2015  . PCP NOTES >>> 09/25/2015  . Need for hepatitis B vaccination 10/19/2014  . Insomnia 12/28/2012  . Annual physical exam 11/23/2011  . Hemorrhoids 10/13/2011  . Morbid obesity ([place]) 02/26/2011  . CHEST PAIN UNSPECIFIED 03/28/2010  . Essential hypertension 10/19/2007  . Headache(784.0) 05/12/2007     Florella Mcneese L 09/04/2016, 2:49 PM  [facility] [address] [place], [place], [address] Phone: [phone]   Fax:  [phone]  Name: Abigail Campos MRN: [id number] Date of Birth: 1971-03-26  Geoffry Paradise, PT,DPT 09/04/16 2:50 PM Phone: [phone] Fax: [phone]

## 2016-09-04 NOTE — Patient Instructions (Signed)
Knee Extension: Terminal - Standing (Single Leg)    Hold onto kitchen sink or table, tie band around post and Left knee. Face anchor in shoulder width stance, band around knee. Allow tension of band to slightly bend knee. Pull leg back, straightening left knee. Repeat 10__ times per set.Do _1_ sets per session. Do _7_ sessions per week. Anchor Height: Knee  [email or web address]   Copyright  VHI. All rights reserved.

## 2016-09-06 ENCOUNTER — Other Ambulatory Visit: Payer: Self-pay | Admitting: Physical Medicine & Rehabilitation

## 2016-09-06 DIAGNOSIS — I610 Nontraumatic intracerebral hemorrhage in hemisphere, subcortical: Secondary | ICD-10-CM

## 2016-09-07 ENCOUNTER — Ambulatory Visit: Payer: Managed Care, Other (non HMO) | Admitting: Occupational Therapy

## 2016-09-07 ENCOUNTER — Encounter: Payer: Managed Care, Other (non HMO) | Admitting: Physical Medicine & Rehabilitation

## 2016-09-07 ENCOUNTER — Ambulatory Visit: Payer: Managed Care, Other (non HMO) | Admitting: *Deleted

## 2016-09-07 ENCOUNTER — Ambulatory Visit: Payer: Managed Care, Other (non HMO)

## 2016-09-07 DIAGNOSIS — I69015 Cognitive social or emotional deficit following nontraumatic subarachnoid hemorrhage: Secondary | ICD-10-CM

## 2016-09-07 DIAGNOSIS — R278 Other lack of coordination: Secondary | ICD-10-CM

## 2016-09-07 DIAGNOSIS — R41841 Cognitive communication deficit: Secondary | ICD-10-CM | POA: Diagnosis not present

## 2016-09-07 DIAGNOSIS — I69352 Hemiplegia and hemiparesis following cerebral infarction affecting left dominant side: Secondary | ICD-10-CM

## 2016-09-07 DIAGNOSIS — M25512 Pain in left shoulder: Secondary | ICD-10-CM

## 2016-09-07 NOTE — Therapy (Signed)
[facility] [address] [place], [place], [address] Phone: [phone]   Fax:  [phone]  Occupational Therapy Treatment  Patient Details  Name: Abigail Campos MRN: [id number] Date of Birth: September 04, 1971 Referring Provider: Dr. Posey Pronto  Encounter Date: 09/07/2016      OT End of Session - 09/07/16 1140    Visit Number 1   Number of Visits 25   Date for OT Re-Evaluation 10/09/16   Authorization Type Aetna (240 visit limit combined with home health, estimated HH used 24 combined), no auth required    Authorization Time Period await clarification of remaining visits   OT Start Time 1149   OT Stop Time 1230   OT Time Calculation (min) 41 min   Activity Tolerance Patient tolerated treatment well   Behavior During Therapy [facility] for tasks assessed/performed      Past Medical History:  Diagnosis Date  . Essential hypertension 10/19/2007   03-2010: metoprolol changed to bystolic (was not feeling well on it, no specific allergy or reaction)    . Headache(784.0)    developed migraines after 2nd pregnancy, had a negative CT of the head  . Hypertension   . Stroke [facility])     Past Surgical History:  Procedure Laterality Date  . ABDOMINAL HYSTERECTOMY  11-14-07   no oophorectomy per surgical report  . CESAREAN SECTION     [id number]  . INGUINAL HERNIA REPAIR     2004    There were no vitals filed for this visit.      Subjective Assessment - 09/07/16 1249    Subjective  Pt reports that she had rash under L arm that has improved and that she has knot in L armpit--going to MD today and will discuss with pt   Pertinent History see Epic   Currently in Pain? No/denies  currently                      OT Treatments/Exercises (OP) - 09/07/16 0001      Splinting   Splinting Fabricated L resting hand splint for improved positioning (pt only had pre-fab wrist splint) and has spasticity in fingers.                 OT Education - 09/07/16 1249    Education provided Yes   Education Details L resting hand splint wear/care   Person(s) Educated Patient   Methods Explanation;Demonstration   Comprehension Verbalized understanding          OT Short Term Goals - 08/11/16 1755      OT SHORT TERM GOAL #1   Title Pt/ caregiver will be I with HEP.   Time 6   Period Weeks   Status New     OT SHORT TERM GOAL #2   Title Pt/ family will verbalize understanding of LUE positioning to minimize pain and risk for injury, including splinting PRN.   Time 6   Period Weeks   Status New     OT SHORT TERM GOAL #3   Title Pt will donn shirt with min A.   Time 6   Period Weeks   Status New     OT SHORT TERM GOAL #4   Title Pt will demonstrate 90* P/ROM shoulder flexion in LUE with pain less than or equal to 3/10.   Time 6   Period Weeks     OT SHORT TERM GOAL #5   Title Pt will perform simple snack prep  modified independently.   Time 6   Period Weeks   Status New     Additional Short Term Goals   Additional Short Term Goals Yes     OT SHORT TERM GOAL #6   Title Pt will perform bathing at a supervision level   Time 6   Period Weeks   Status New           OT Long Term Goals - 08/11/16 1759      OT LONG TERM GOAL #1   Title Pt will perform LB dressing with min A.   Time 12   Period Weeks   Status New     OT LONG TERM GOAL #2   Title Pt will use LUE as a stabilizer/ gross A with min A.   Time 12   Period Weeks   Status New     OT LONG TERM GOAL #3   Title Pt will perform simple home management tasks with min A.   Time 6   Period Weeks   Status New     OT LONG TERM GOAL #4   Title Pt will demonstrate adequate standing balance to retrieve a light weight object from overhead cabinet modified independently with RUE without LOB in prep for IADLS.   Time 12   Period Weeks   Status New     OT LONG TERM GOAL #5   Title Pt will perform shower transfers modified  independently   Time 12   Period Weeks   Status New               Plan - 09/07/16 1251    Clinical Impression Statement Pt verbalized understanding of splint wear/care after instruction but will need review.  Pt demo improved positioning of fingers with L resting hand splint   Rehab Potential Good   Clinical Impairments Affecting Rehab Potential severity of deficits   OT Frequency 2x / week  plus eval   OT Duration 12 weeks   OT Treatment/Interventions Self-care/ADL training;Moist Heat;Fluidtherapy;DME and/or AE instruction;Splinting;Patient/family education;Balance Financial planner;Contrast Bath;Ultrasound;Therapeutic exercise;Therapeutic activities;Cognitive remediation/compensation;Passive range of motion;Functional Mobility Training;Neuromuscular education;Cryotherapy;Electrical Stimulation;Parrafin;Energy conservation;Manual Therapy;Visual/perceptual remediation/compensation   Plan initiate HEP in supine; check resting hand splint prn and review wear schedule   OT Home Exercise Plan 09/07/16:  resting splint wear/care   Consulted and Agree with Plan of Care Family member/caregiver;Patient   Family Member Consulted mother , husband      Patient will benefit from skilled therapeutic intervention in order to improve the following deficits and impairments:  Abnormal gait, Decreased coordination, Decreased range of motion, Difficulty walking, Impaired flexibility, Impaired sensation, Increased edema, Decreased safety awareness, Decreased endurance, Decreased activity tolerance, Decreased knowledge of precautions, Increased fascial restricitons, Impaired tone, Pain, Impaired UE functional use, Decreased knowledge of use of DME, Decreased balance, Decreased cognition, Decreased mobility, Decreased strength, Impaired perceived functional ability, Impaired vision/preception  Visit Diagnosis: Hemiplegia and hemiparesis following cerebral infarction affecting left dominant side  (HCC)  Other lack of coordination  Pain in left shoulder    Problem List Patient Active Problem List   Diagnosis Date Noted  . Left flaccid hemiplegia ([place]) 06/24/2016  . Ear itching   . Poor fluid intake   . Bradycardia, drug induced   . Poor nutrition   . Muscle spasticity   . Obesity (BMI 30.0-34.9) 04/08/2016  . Hyperlipidemia 04/08/2016  . Basal ganglia hemorrhage ([place]) 04/08/2016  . Aphasia as late effect of cerebrovascular accident   . Dysphagia as late effect  of cerebrovascular disease   . Migraine with aura and without status migrainosus, not intractable   . HLD (hyperlipidemia)   . Benign essential HTN   . Gait disturbance, post-stroke   . Hemiplegia, post-stroke ([place])   . Hypokalemia   . Dysphagia, post-stroke   . Aphasia, post-stroke   . Bradycardia   . Headache, migraine   . Dysarthria, post-stroke   . Encephalopathy acute 04/02/2016  . ICH (intracerebral hemorrhage) (HCC) - R basal ganglia due to hypertensive emergency 04/01/2016  . Upper airway cough syndrome 12/06/2015  . PCP NOTES >>> 09/25/2015  . Need for hepatitis B vaccination 10/19/2014  . Insomnia 12/28/2012  . Annual physical exam 11/23/2011  . Hemorrhoids 10/13/2011  . Morbid obesity ([place]) 02/26/2011  . CHEST PAIN UNSPECIFIED 03/28/2010  . Essential hypertension 10/19/2007  . Headache(784.0) 05/12/2007    [facility] 09/07/2016, 12:59 PM  [facility] [address] [place], [place], [address] Phone: [phone]   Fax:  [phone]  Name: Abigail Campos MRN: [id number] Date of Birth: [birth year]   Vianne Bulls, OTR/L [facility] [address] [place], [place]  [phone] phone [phone] 09/07/16 12:59 PM

## 2016-09-07 NOTE — Patient Instructions (Addendum)
Your Splint This splint should initially be fitted by a healthcare practitioner.  The healthcare practitioner is responsible for providing wearing instructions and precautions to the patient, other healthcare practitioners and care provider involved in the patient's care.  This splint was custom made for you. Please read the following instructions to learn about wearing and caring for your splint.  Precautions Should your splint cause any of the following problems, remove the splint immediately and contact your therapist/physician.  Swelling  Severe Pain  Pressure Areas  Stiffness  Numbness  Do not wear your splint while operating machinery unless it has been fabricated for that purpose.  When To Wear Your Splint Where your splint according to your therapist/physician instructions. Daytime for 3-4 hours  Then if no problems after the first 2-3 days, you may slowly incr wear time, remove after 3 hours and dry hand/splint completely before reapplying.  Check you skin every hour the first day.  Care and Cleaning of Your Splint 1. Keep your splint away from open flames. 2. Your splint will lose its shape in temperatures over 135 degrees Farenheit, ( in car windows, near radiators, ovens or in hot water).  Never make any adjustments to your splint, if the splint needs adjusting remove it and make an appointment to see your therapist. 3. Your splint, including the cushion liner may be cleaned with soap and lukewarm water or rubbing alcohol.  Do not immerse in hot water over 135 degrees Farenheit. 4. Straps may be washed with soap and water, but do not moisten the self-adhesive portion. 5. For ink or hard to remove spots use a scouring cleanser which contains chlorine.  Rinse the splint thoroughly after using chlorine cleanser.

## 2016-09-07 NOTE — Therapy (Signed)
[facility] [address] [place], [place], [address] Phone: [phone]   Fax:  [phone]  Physical Therapy Treatment  Patient Details  Name: Abigail Campos MRN: [id number] Date of Birth: [birth year] Referring Provider: Dr. Posey Pronto  Encounter Date: 09/07/2016      PT End of Session - 09/07/16 1323    Visit Number 6   Number of Visits 17   Date for PT Re-Evaluation 10/10/16   Authorization Type [employer]-240 visit limit (including HH and OP). Per Lattie Haw, pt used 24 total HHPT, [place], and [place] visits.   PT Start Time 1115  pt arrived late and had to use bathroom prior to PT   PT Stop Time 1144   PT Time Calculation (min) 29 min   Equipment Utilized During Treatment Gait belt   Activity Tolerance Patient tolerated treatment well   Behavior During Therapy WFL for tasks assessed/performed      Past Medical History:  Diagnosis Date  . Essential hypertension 10/19/2007   03-2010: metoprolol changed to bystolic (was not feeling well on it, no specific allergy or reaction)    . Headache(784.0)    developed migraines after 2nd pregnancy, had a negative CT of the head  . Hypertension   . Stroke [facility])     Past Surgical History:  Procedure Laterality Date  . ABDOMINAL HYSTERECTOMY  11-14-07   no oophorectomy per surgical report  . CESAREAN SECTION     [id number]  . INGUINAL HERNIA REPAIR     2004    There were no vitals filed for this visit.      Subjective Assessment - 09/07/16 1116    Subjective Pt denied falls since last visit. Pt reported L knee is a little swollen. Pt late today, as her aunt passed away this morning.    Pertinent History HTN, hyperlipidemia, hx of migraine   Patient Stated Goals I want to walk and stand up without falling.    Currently in Pain? No/denies                         [facility] Adult PT Treatment/Exercise - 09/07/16 1121      Standardized Balance Assessment   Standardized Balance Assessment Berg Balance Test     Berg Balance Test   Sit to Stand Able to stand without using hands and stabilize independently   Standing Unsupported Able to stand 2 minutes with supervision   Sitting with Back Unsupported but Feet Supported on Floor or Stool Able to sit safely and securely 2 minutes   Stand to Sit Sits safely with minimal use of hands   Transfers Able to transfer safely, definite need of hands   Standing Unsupported with Eyes Closed Able to stand 3 seconds  5 sec.   Standing Ubsupported with Feet Together Needs help to attain position but able to stand for 30 seconds with feet together   From Standing, Reach Forward with Outstretched Arm Loses balance while trying/requires external support   From Standing Position, Pick up Object from Floor Able to pick up shoe, needs supervision   From Standing Position, Turn to Look Behind Over each Shoulder Needs supervision when turning   Turn 360 Degrees Needs assistance while turning   Standing Unsupported, Alternately Place Feet on Step/Stool Needs assistance to keep from falling or unable to try   Standing Unsupported, One Foot in [place] help to step but can hold 15 seconds   Standing on One  Leg Tries to lift leg/unable to hold 3 seconds but remains standing independently   Total Score 27           Self Care:     PT Education - 09/07/16 1323    Education provided Yes   Education Details PT discussed outcome measure results and goal progress. Pt asked if she would get function back in her L side. PT educated pt that most functional gains are made within 6-12 months s/p CVA, and we would continue to work on improving functional mobility. PT educated pt that there is no absolute way of knowing what might return, but it is less likely to return s/p one year, and we'll continue to work on any new functional abililty pt obtains.    Person(s) Educated Patient   Methods Explanation   Comprehension  Verbalized understanding          PT Short Term Goals - 09/07/16 1330      PT SHORT TERM GOAL #1   Title Pt will be IND in performing HEP to improve strength and balance. TARGET DATE FOR ALL STGS: 09/08/16   Status On-going     PT SHORT TERM GOAL #2   Title Perform BERG and write goals if appropriate.   Status Achieved     PT SHORT TERM GOAL #3   Title Pt will amb. 200' over even terrain with LRAD at MOD I level to improve functional mobility.    Status On-going     PT SHORT TERM GOAL #4   Title Pt will improve gait speed to 0.62f/sec with LRAD to decrease falls risk.    Status On-going     PT SHORT TERM GOAL #5   Title Pt will improve BERG score to >/=21/56 to decr. falls risk.    Status Achieved           PT Long Term Goals - 09/07/16 1330      PT LONG TERM GOAL #1   Title Pt will verbalize understanding or CVA risk factors and signs/symptoms to reduce risk of additional CVA. TARGET DATE FOR ALL LTGS: 10/06/16   Status On-going     PT LONG TERM GOAL #2   Title Pt will improve gait speed to >/=1.869fsec. with LRAD to reduce falls risk.    Status On-going     PT LONG TERM GOAL #3   Title Pt will amb. 400' over even/paved surfaces with LRAD at MOD I level to improve functional mobility.    Status On-going     PT LONG TERM GOAL #4   Title Pt will perform TUG with LRAD in </=30sec. to improve function and decr. falls risk.    Status On-going     PT LONG TERM GOAL #5   Title Pt will perform stand pivot txfs from chair to w/c at MOD I level to improve functional mobility.    Status On-going     PT LONG TERM GOAL #6   Title Pt will improve BERG score to >/=31/56 to decr. falls risk.    Baseline 27/56 on 09/07/16, therefore, PT revised long term BERG goal from 25/56 to [place] 09/07/16 1325    Clinical Impression Statement Pt demonstrated progress as she met STG 5 and LTG 6. However, BERG score still indicates pt is at  risk for falls. Pt required seated rest breaks during session as she was  fearful of falling and tearful 2/2 her aunt passing away today. PT will assess remaining goals next session.    Rehab Potential Good   Clinical Impairments Affecting Rehab Potential co-morbidities   PT Frequency 2x / week   PT Duration 8 weeks   PT Treatment/Interventions ADLs/Self Care Home Management;Biofeedback;Canalith Repostioning;Electrical Stimulation;Cognitive remediation;Neuromuscular re-education;Balance training;Therapeutic exercise;Therapeutic activities;Functional mobility training;Stair training;Gait training;DME Instruction;Orthotic Fit/Training;Patient/family education;Vestibular;Manual techniques   PT Next Visit Plan Continue to check STGs. Gait training with control of L terminal knee ext. Balance and strength training.    PT Home Exercise Plan Strengthening HEP.    Consulted and Agree with Plan of Care Patient;Family member/caregiver   Family Member Consulted pt's mom: [name]      Patient will benefit from skilled therapeutic intervention in order to improve the following deficits and impairments:  Abnormal gait, Decreased endurance, Decreased knowledge of use of DME, Decreased strength, Impaired UE functional use, Impaired sensation, Decreased balance, Decreased mobility, Decreased range of motion, Decreased coordination, Impaired flexibility, Decreased cognition  Visit Diagnosis: Hemiplegia and hemiparesis following cerebral infarction affecting left dominant side ([place])  Other lack of coordination     Problem List Patient Active Problem List   Diagnosis Date Noted  . Left flaccid hemiplegia ([place]) 06/24/2016  . Ear itching   . Poor fluid intake   . Bradycardia, drug induced   . Poor nutrition   . Muscle spasticity   . Obesity (BMI 30.0-34.9) 04/08/2016  . Hyperlipidemia 04/08/2016  . Basal ganglia hemorrhage ([place]) 04/08/2016  . Aphasia as late effect of cerebrovascular accident   .  Dysphagia as late effect of cerebrovascular disease   . Migraine with aura and without status migrainosus, not intractable   . HLD (hyperlipidemia)   . Benign essential HTN   . Gait disturbance, post-stroke   . Hemiplegia, post-stroke ([place])   . Hypokalemia   . Dysphagia, post-stroke   . Aphasia, post-stroke   . Bradycardia   . Headache, migraine   . Dysarthria, post-stroke   . Encephalopathy acute 04/02/2016  . ICH (intracerebral hemorrhage) (HCC) - R basal ganglia due to hypertensive emergency 04/01/2016  . Upper airway cough syndrome 12/06/2015  . PCP NOTES >>> 09/25/2015  . Need for hepatitis B vaccination 10/19/2014  . Insomnia 12/28/2012  . Annual physical exam 11/23/2011  . Hemorrhoids 10/13/2011  . Morbid obesity ([place]) 02/26/2011  . CHEST PAIN UNSPECIFIED 03/28/2010  . Essential hypertension 10/19/2007  . Headache(784.0) 05/12/2007    Miller,Jennifer L 09/07/2016, 1:32 PM  [facility] [address] [place], [place], [address] Phone: [phone]   Fax:  [phone]  Name: Abigail Campos MRN: [id number] Date of Birth: [birth year]  Geoffry Paradise, PT,DPT 09/07/16 1:32 PM Phone: [phone] Fax: [phone]

## 2016-09-07 NOTE — Therapy (Addendum)
[facility] [address] [place], [place], [address] Phone: [phone]   Fax:  [phone]  Speech Language Pathology Treatment  Patient Details  Name: Abigail Campos MRN: [id number] Date of Birth: February 24, 1971 Referring Provider: Delice Lesch, M.D.  Encounter Date: 09/07/2016      End of Session - 09/07/16 1607    Visit Number 6   Number of Visits 16   Date for SLP Re-Evaluation 10/23/16   SLP Start Time 56   SLP Stop Time  1315   SLP Time Calculation (min) 45 min   Activity Tolerance Patient tolerated treatment well      Past Medical History:  Diagnosis Date  . Essential hypertension 10/19/2007   03-2010: metoprolol changed to bystolic (was not feeling well on it, no specific allergy or reaction)    . Headache(784.0)    developed migraines after 2nd pregnancy, had a negative CT of the head  . Hypertension   . Stroke [facility])     Past Surgical History:  Procedure Laterality Date  . ABDOMINAL HYSTERECTOMY  11-14-07   no oophorectomy per surgical report  . CESAREAN SECTION     [id number]  . INGUINAL HERNIA REPAIR     2004    There were no vitals filed for this visit.      Subjective Assessment - 09/07/16 1251    Subjective "I can't do it. Okay, I'll try."   Currently in Pain? No/denies               ADULT SLP TREATMENT - 09/07/16 1601      General Information   Behavior/Cognition Alert;Cooperative;Pleasant mood     Treatment Provided   Treatment provided Cognitive-Linquistic     Pain Assessment   Pain Assessment No/denies pain     Cognitive-Linquistic Treatment   Treatment focused on Cognition;Aphasia   Skilled Treatment Pt's session focused on self-monitoring as related to attention and language use. Pt was able to complete a semi-complex reasoning and organizational task at  min A for reasoning and mod I for use of compensatory strategies related to organization. Pt benefited from  occasional verbal cueing to identify speech errors, but was able to self-correct once errors were identified.     Assessment / Recommendations / Plan   Plan Continue with current plan of care     Progression Toward Goals   Progression toward goals Progressing toward goals            SLP Short Term Goals - 09/07/16 1609      SLP SHORT TERM GOAL #1   Title pt will name 3 deficit areas over three sessions   Period Weeks   Status On-going     SLP SHORT TERM GOAL #2   Title pt will demo selective attention to therapy tasks for 10 minutes in min-mod noisy environment   Time 3   Period Weeks   Status On-going     SLP SHORT TERM GOAL #3   Title pt will complete tasks of simple cognitive linguistic organization with 90% success and rare min A   Time 3   Period Weeks   Status Achieved     SLP SHORT TERM GOAL #4   Title pt will attempt to self correct misarticulated speech in 50% of opportunities in structured speech tasks   Time 3   Period Weeks   Status On-going          SLP Long Term Goals - 09/07/16 1623  SLP LONG TERM GOAL #1   Title pt will demo alternating attention in simple cognitive linguistic tasks with appropriate switch times, with compensations   Time 7   Period Weeks   Status On-going     SLP LONG TERM GOAL #2   Title pt will demo emergent awareness in cognitive linguistic tasks by self correcting errors in 80% of opportunities with nonverbal cues   Time 7   Period Weeks   Status On-going     SLP LONG TERM GOAL #3   Title pt will demo functional organization in mod complex linguistic tasks with modified independence   Time 7   Period Weeks   Status On-going     SLP LONG TERM GOAL #4   Title pt will demo appropriate rate of speech in 5 minutes simple to mod complex conversation with occasional nonverbal cues   Time 7   Period Weeks   Status On-going     SLP LONG TERM GOAL #5   Title pt will demo HEP for dysarthria with min A   Time 7    Period Weeks   Status On-going          Plan - 09/07/16 1608    Clinical Impression Statement Pt with improvement noted this date with use of compensatory strategies for organization and reasoning. Min A for reasoning in a semi-complex task. Will gradually introduce distractors to the environment.   Speech Therapy Frequency 2x / week   Duration --  7 weeks   Treatment/Interventions Compensatory strategies;Patient/family education;Functional tasks;Cueing hierarchy;Cognitive reorganization;Internal/external aids;SLP instruction and feedback;Language facilitation   Potential to Achieve Goals Good   Potential Considerations Severity of impairments   Consulted and Agree with Plan of Care Patient      Patient will benefit from skilled therapeutic intervention in order to improve the following deficits and impairments:   Cognitive social or emotional deficit following nontraumatic subarachnoid hemorrhage    Problem List Patient Active Problem List   Diagnosis Date Noted  . Left flaccid hemiplegia ([place]) 06/24/2016  . Ear itching   . Poor fluid intake   . Bradycardia, drug induced   . Poor nutrition   . Muscle spasticity   . Obesity (BMI 30.0-34.9) 04/08/2016  . Hyperlipidemia 04/08/2016  . Basal ganglia hemorrhage ([place]) 04/08/2016  . Aphasia as late effect of cerebrovascular accident   . Dysphagia as late effect of cerebrovascular disease   . Migraine with aura and without status migrainosus, not intractable   . HLD (hyperlipidemia)   . Benign essential HTN   . Gait disturbance, post-stroke   . Hemiplegia, post-stroke ([place])   . Hypokalemia   . Dysphagia, post-stroke   . Aphasia, post-stroke   . Bradycardia   . Headache, migraine   . Dysarthria, post-stroke   . Encephalopathy acute 04/02/2016  . ICH (intracerebral hemorrhage) (HCC) - R basal ganglia due to hypertensive emergency 04/01/2016  . Upper airway cough syndrome 12/06/2015  . PCP NOTES >>> 09/25/2015  . Need for  hepatitis B vaccination 10/19/2014  . Insomnia 12/28/2012  . Annual physical exam 11/23/2011  . Hemorrhoids 10/13/2011  . Morbid obesity ([place]) 02/26/2011  . CHEST PAIN UNSPECIFIED 03/28/2010  . Essential hypertension 10/19/2007  . Headache(784.0) 05/12/2007    Vinetta Bergamo MA, CCC-SLP 09/07/2016, 4:25 PM  [facility] [address] [place], [place], [address] Phone: [phone]   Fax:  [phone]   Name: Abigail Campos MRN: [id number] Date of Birth: 02-14-1971

## 2016-09-09 ENCOUNTER — Ambulatory Visit: Payer: Managed Care, Other (non HMO)

## 2016-09-09 ENCOUNTER — Ambulatory Visit: Payer: Managed Care, Other (non HMO) | Admitting: Occupational Therapy

## 2016-09-09 VITALS — BP 125/75 | HR 58

## 2016-09-09 DIAGNOSIS — R2689 Other abnormalities of gait and mobility: Secondary | ICD-10-CM

## 2016-09-09 DIAGNOSIS — R278 Other lack of coordination: Secondary | ICD-10-CM

## 2016-09-09 DIAGNOSIS — I69352 Hemiplegia and hemiparesis following cerebral infarction affecting left dominant side: Secondary | ICD-10-CM

## 2016-09-09 DIAGNOSIS — R41841 Cognitive communication deficit: Secondary | ICD-10-CM | POA: Diagnosis not present

## 2016-09-09 DIAGNOSIS — I69115 Cognitive social or emotional deficit following nontraumatic intracerebral hemorrhage: Secondary | ICD-10-CM

## 2016-09-09 NOTE — Therapy (Signed)
[facility] [address] [place], [place], [address] Phone: [phone]   Fax:  [phone]  Occupational Therapy Treatment  Patient Details  Name: Abigail Campos MRN: [id number] Date of Birth: 06/24/1971 Referring Provider: Dr. Posey Pronto  Encounter Date: 09/09/2016      OT End of Session - 09/09/16 1717    Visit Number 3   Number of Visits 25   Date for OT Re-Evaluation 10/09/16   Authorization Type [employer] (240 visit limit combined with home health, estimated HH used 24 combined), no auth required    OT Start Time 1532   OT Stop Time 1615   OT Time Calculation (min) 43 min   Activity Tolerance Patient tolerated treatment well   Behavior During Therapy [facility] for tasks assessed/performed      Past Medical History:  Diagnosis Date  . Essential hypertension 10/19/2007   03-2010: metoprolol changed to bystolic (was not feeling well on it, no specific allergy or reaction)    . Headache(784.0)    developed migraines after 2nd pregnancy, had a negative CT of the head  . Hypertension   . Stroke [facility])     Past Surgical History:  Procedure Laterality Date  . ABDOMINAL HYSTERECTOMY  11-14-07   no oophorectomy per surgical report  . CESAREAN SECTION     [id number]  . INGUINAL HERNIA REPAIR     2004    There were no vitals filed for this visit.      Subjective Assessment - 09/09/16 1713    Pertinent History see Epic   Patient Stated Goals to be able to use left arm   Currently in Pain? No/denies          splint check- splint appears to be fitting well without pressure areas. Pt reports wearing splint 3 hrs at a time. Seated edge of mat body on arm movements for lateral weight shift, lateral trunk flexion, anterior / posterior pelvic tilts with arms supported on rolling table, mod facilitation. Pt is very fearful of weight shift to left side and she demonstrates poor awareness of midline. Crossbody reaching with  trunk rotation, while performing gentle weightbearing through LUE on table with LOB requiring therapist assist to recover. Weightbearing through left elbow with lateral trunk flexion and weight shifts, mod v.c./ facilitation. Transfer stand pivot to w/c with min A.                       OT Short Term Goals - 08/11/16 1755      OT SHORT TERM GOAL #1   Title Pt/ caregiver will be I with HEP.   Time 6   Period Weeks   Status New     OT SHORT TERM GOAL #2   Title Pt/ family will verbalize understanding of LUE positioning to minimize pain and risk for injury, including splinting PRN.   Time 6   Period Weeks   Status New     OT SHORT TERM GOAL #3   Title Pt will donn shirt with min A.   Time 6   Period Weeks   Status New     OT SHORT TERM GOAL #4   Title Pt will demonstrate 90* P/ROM shoulder flexion in LUE with pain less than or equal to 3/10.   Time 6   Period Weeks     OT SHORT TERM GOAL #5   Title Pt will perform simple snack prep modified independently.   Time 6  Period Weeks   Status New     Additional Short Term Goals   Additional Short Term Goals Yes     OT SHORT TERM GOAL #6   Title Pt will perform bathing at a supervision level   Time 6   Period Weeks   Status New           OT Long Term Goals - 08/11/16 1759      OT LONG TERM GOAL #1   Title Pt will perform LB dressing with min A.   Time 12   Period Weeks   Status New     OT LONG TERM GOAL #2   Title Pt will use LUE as a stabilizer/ gross A with min A.   Time 12   Period Weeks   Status New     OT LONG TERM GOAL #3   Title Pt will perform simple home management tasks with min A.   Time 6   Period Weeks   Status New     OT LONG TERM GOAL #4   Title Pt will demonstrate adequate standing balance to retrieve a light weight object from overhead cabinet modified independently with RUE without LOB in prep for IADLS.   Time 12   Period Weeks   Status New     OT LONG TERM GOAL  #5   Title Pt will perform shower transfers modified independently   Time 12   Period Weeks   Status New               Plan - 09/09/16 1714    Clinical Impression Statement Pt's splint appears to be fitting well. She is wearing it for 3 hours at a time.   Rehab Potential Good   Clinical Impairments Affecting Rehab Potential severity of deficits   OT Frequency 2x / week   OT Duration 12 weeks   OT Treatment/Interventions Self-care/ADL training;Moist Heat;Fluidtherapy;DME and/or AE instruction;Splinting;Patient/family education;Balance Financial planner;Contrast Bath;Ultrasound;Therapeutic exercise;Therapeutic activities;Cognitive remediation/compensation;Passive range of motion;Functional Mobility Training;Neuromuscular education;Cryotherapy;Electrical Stimulation;Parrafin;Energy conservation;Manual Therapy;Visual/perceptual remediation/compensation   Plan continue to perform body on arm movements,, consider HEP in supine   Consulted and Agree with Plan of Care Patient;Family member/caregiver      Patient will benefit from skilled therapeutic intervention in order to improve the following deficits and impairments:  Abnormal gait, Decreased coordination, Decreased range of motion, Difficulty walking, Impaired flexibility, Impaired sensation, Increased edema, Decreased safety awareness, Decreased endurance, Decreased activity tolerance, Decreased knowledge of precautions, Increased fascial restricitons, Impaired tone, Pain, Impaired UE functional use, Decreased knowledge of use of DME, Decreased balance, Decreased cognition, Decreased mobility, Decreased strength, Impaired perceived functional ability, Impaired vision/preception  Visit Diagnosis: Other lack of coordination  Hemiplegia and hemiparesis following cerebral infarction affecting left dominant side (HCC)  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 Left flaccid hemiplegia ([place]) 06/24/2016  . Ear itching   . Poor fluid intake   . Bradycardia, drug induced   . Poor nutrition   . Muscle spasticity   . Obesity (BMI 30.0-34.9) 04/08/2016  . Hyperlipidemia 04/08/2016  . Basal ganglia hemorrhage ([place]) 04/08/2016  . Aphasia as late effect of cerebrovascular accident   . Dysphagia as late effect of cerebrovascular disease   . Migraine with aura and without status migrainosus, not intractable   . HLD (hyperlipidemia)   . Benign essential HTN   . Gait disturbance, post-stroke   .  Hemiplegia, post-stroke ([place])   . Hypokalemia   . Dysphagia, post-stroke   . Aphasia, post-stroke   . Bradycardia   . Headache, migraine   . Dysarthria, post-stroke   . Encephalopathy acute 04/02/2016  . ICH (intracerebral hemorrhage) (HCC) - R basal ganglia due to hypertensive emergency 04/01/2016  . Upper airway cough syndrome 12/06/2015  . PCP NOTES >>> 09/25/2015  . Need for hepatitis B vaccination 10/19/2014  . Insomnia 12/28/2012  . Annual physical exam 11/23/2011  . Hemorrhoids 10/13/2011  . Morbid obesity ([place]) 02/26/2011  . CHEST PAIN UNSPECIFIED 03/28/2010  . Essential hypertension 10/19/2007  . Headache(784.0) 05/12/2007    RINE,KATHRYN 09/09/2016, 5:18 PM  [facility] [address] [place], [place], [address] Phone: [phone]   Fax:  [phone]  Name: Abigail Campos MRN: [id number] Date of Birth: 1971-10-11

## 2016-09-09 NOTE — Therapy (Signed)
[facility] [address] [place], [place], [address] Phone: [phone]   Fax:  [phone]  Physical Therapy Treatment  Patient Details  Name: Abigail Campos MRN: [id number] Date of Birth: 01-Jan-1971 Referring Provider: Dr. Posey Pronto  Encounter Date: 09/09/2016      PT End of Session - 09/09/16 1457    Visit Number 7   Number of Visits 17   Date for PT Re-Evaluation 10/10/16   Authorization Type Aetna-240 visit limit (including HH and OP). Per Lattie Haw, pt used 24 total HHPT, [place], and [place] visits.   PT Start Time 1407   PT Stop Time 1447   PT Time Calculation (min) 40 min   Equipment Utilized During Treatment Gait belt   Activity Tolerance Other (comment)  limited by lightheadedness   Behavior During Therapy WFL for tasks assessed/performed      Past Medical History:  Diagnosis Date  . Essential hypertension 10/19/2007   03-2010: metoprolol changed to bystolic (was not feeling well on it, no specific allergy or reaction)    . Headache(784.0)    developed migraines after 2nd pregnancy, had a negative CT of the head  . Hypertension   . Stroke [facility])     Past Surgical History:  Procedure Laterality Date  . ABDOMINAL HYSTERECTOMY  11-14-07   no oophorectomy per surgical report  . CESAREAN SECTION     [id number]  . INGUINAL HERNIA REPAIR     2004    Vitals:   09/09/16 1504  BP: 125/75  Pulse: (!) 58        Subjective Assessment - 09/09/16 1409    Subjective Pt denied falls since last visit. pt reported she placed a pillow under L knee last night and states the swelling is better.  Pt missed appt with Dr. Posey Pronto on Tuesday but has it reschedule for next Wed. (09/16/16). Pt has an appt at [facility] tomorrow at 0830. Pt had L PRAFO donned today, as she feels this reduces L knee swelling.    Pertinent History HTN, hyperlipidemia, hx of migraine   Patient Stated Goals I want to walk and stand up without  falling.    Currently in Pain? No/denies                         [facility] Adult PT Treatment/Exercise - 09/09/16 1411      Ambulation/Gait   Ambulation/Gait Yes   Ambulation/Gait Assistance 4: Min guard   Ambulation/Gait Assistance Details Pt amb. with L PRAFO, as she did not have L tennis shoe for PT session.  Pt c/o lightheadedness during amb. and required frequent standing rest breaks and 3 seated rest breaks after each bout of amb.    Ambulation Distance (Feet) 50 Feet  x2 and 105'   Assistive device Hemi-walker   Gait Pattern Step-to pattern;Decreased stride length;Decreased stance time - left;Decreased step length - right;Decreased dorsiflexion - left;Left circumduction;Left genu recurvatum   Ambulation Surface Level;Indoor   Gait velocity 0.54f/sec. and 0.461fsec. with hemi-walker and L PRAFO donned.   Pre-Gait Activities PT demonstrated proper technique and sequencing with [facility] SB[facility], pt stood with each cane in R hand to feel the difference between the hemi-walker, [facility] SB[facility]. Pt's static balance best with hemi-walker and LBQC, will attempt amb. with LBQC next session.                 PT Education - 09/09/16 1456    Education provided  Yes   Education Details PT reiterated that L PRAFO likely does not assist in reducing L knee swelling, but elevating LLE should help to reduce swelling and to inform MD if swelling persists or becomes painful. PT discussed goal progress and outcome measures.  PT educated pt that her BP was WNL but HR was lower, and to inform MD if lightheadedness persists. PT also informed pt's OT.    Person(s) Educated Patient   Methods Explanation   Comprehension Verbalized understanding          PT Short Term Goals - 09/09/16 1501      PT SHORT TERM GOAL #1   Title Pt will be IND in performing HEP to improve strength and balance. TARGET DATE FOR ALL STGS: 09/08/16   Status On-going     PT SHORT TERM GOAL #2   Title Perform  BERG and write goals if appropriate.   Status Achieved     PT SHORT TERM GOAL #3   Title Pt will amb. 200' over even terrain with LRAD at MOD I level to improve functional mobility.    Status Partially Met     PT SHORT TERM GOAL #4   Title Pt will improve gait speed to 0.16f/sec with LRAD to decrease falls risk.    Status Not Met     PT SHORT TERM GOAL #5   Title Pt will improve BERG score to >/=21/56 to decr. falls risk.    Status Achieved           PT Long Term Goals - 09/09/16 1501      PT LONG TERM GOAL #1   Title Pt will verbalize understanding or CVA risk factors and signs/symptoms to reduce risk of additional CVA. TARGET DATE FOR ALL LTGS: 10/06/16   Status On-going     PT LONG TERM GOAL #2   Title Pt will improve gait speed to >/=1.884fsec. with LRAD to reduce falls risk.    Status On-going     PT LONG TERM GOAL #3   Title Pt will amb. 400' over even/paved surfaces with LRAD at MOD I level to improve functional mobility.    Status On-going     PT LONG TERM GOAL #4   Title Pt will perform TUG with LRAD in </=30sec. to improve function and decr. falls risk.    Status On-going     PT LONG TERM GOAL #5   Title Pt will perform stand pivot txfs from chair to w/c at MOD I level to improve functional mobility.    Status On-going     PT LONG TERM GOAL #6   Title Pt will improve BERG score to >/=31/56 to decr. falls risk.    Baseline 27/56 on 09/07/16, therefore, PT revised long term BERG goal from 25/56 to [place] 09/09/16 1457    Clinical Impression Statement Pt partially met STG 3 and did not meet STG 4. Pt limited today by lightheadedness and she did not have L tennis shoe donned (she wore a L PRAFO). Those factors likely contributed to pt's difficulty amb. longer distances today. Pt would continue to benefit from skilled PT to improve safety during functional mobility.    Rehab Potential Good   Clinical Impairments  Affecting Rehab Potential co-morbidities   PT Frequency 2x / week   PT Duration 8 weeks   PT Treatment/Interventions ADLs/Self Care Home Management;Biofeedback;Canalith  Repostioning;Electrical Stimulation;Cognitive remediation;Neuromuscular re-education;Balance training;Therapeutic exercise;Therapeutic activities;Functional mobility training;Stair training;Gait training;DME Instruction;Orthotic Fit/Training;Patient/family education;Vestibular;Manual techniques   PT Next Visit Plan Check HEP and progress as tolerated. Trial gait training with LBQC.   PT Home Exercise Plan Strengthening HEP.    Consulted and Agree with Plan of Care Patient;Family member/caregiver   Family Member Consulted pt's mom: [name]      Patient will benefit from skilled therapeutic intervention in order to improve the following deficits and impairments:  Abnormal gait, Decreased endurance, Decreased knowledge of use of DME, Decreased strength, Impaired UE functional use, Impaired sensation, Decreased balance, Decreased mobility, Decreased range of motion, Decreased coordination, Impaired flexibility, Decreased cognition  Visit Diagnosis: Other abnormalities of gait and mobility  Hemiplegia and hemiparesis following cerebral infarction affecting left dominant side ([place])  Other lack of coordination     Problem List Patient Active Problem List   Diagnosis Date Noted  . Left flaccid hemiplegia ([place]) 06/24/2016  . Ear itching   . Poor fluid intake   . Bradycardia, drug induced   . Poor nutrition   . Muscle spasticity   . Obesity (BMI 30.0-34.9) 04/08/2016  . Hyperlipidemia 04/08/2016  . Basal ganglia hemorrhage ([place]) 04/08/2016  . Aphasia as late effect of cerebrovascular accident   . Dysphagia as late effect of cerebrovascular disease   . Migraine with aura and without status migrainosus, not intractable   . HLD (hyperlipidemia)   . Benign essential HTN   . Gait disturbance, post-stroke   . Hemiplegia,  post-stroke ([place])   . Hypokalemia   . Dysphagia, post-stroke   . Aphasia, post-stroke   . Bradycardia   . Headache, migraine   . Dysarthria, post-stroke   . Encephalopathy acute 04/02/2016  . ICH (intracerebral hemorrhage) (HCC) - R basal ganglia due to hypertensive emergency 04/01/2016  . Upper airway cough syndrome 12/06/2015  . PCP NOTES >>> 09/25/2015  . Need for hepatitis B vaccination 10/19/2014  . Insomnia 12/28/2012  . Annual physical exam 11/23/2011  . Hemorrhoids 10/13/2011  . Morbid obesity ([place]) 02/26/2011  . CHEST PAIN UNSPECIFIED 03/28/2010  . Essential hypertension 10/19/2007  . Headache(784.0) 05/12/2007    Crandall Harvel L 09/09/2016, 3:05 PM  [facility] [address] [place], [place], [address] Phone: [phone]   Fax:  [phone]  Name: FABIOLA MUDGETT MRN: [id number] Date of Birth: May 22, 1971   Geoffry Paradise, PT,DPT 09/09/16 3:05 PM Phone: [phone] Fax: [phone]

## 2016-09-10 ENCOUNTER — Ambulatory Visit: Payer: Managed Care, Other (non HMO) | Admitting: Occupational Therapy

## 2016-09-10 ENCOUNTER — Ambulatory Visit: Payer: Managed Care, Other (non HMO) | Admitting: *Deleted

## 2016-09-14 ENCOUNTER — Telehealth: Payer: Self-pay | Admitting: Physical Medicine & Rehabilitation

## 2016-09-14 DIAGNOSIS — I610 Nontraumatic intracerebral hemorrhage in hemisphere, subcortical: Secondary | ICD-10-CM

## 2016-09-14 MED ORDER — BACLOFEN 10 MG PO TABS: ORAL_TABLET | ORAL | 3 refills | Status: DC

## 2016-09-14 NOTE — Telephone Encounter (Signed)
Had to reschedule patient's appointment this week for [facility] and patient will be out of medication.  She will need Baclofen and Prozac.  Please call patient.

## 2016-09-14 NOTE — Telephone Encounter (Signed)
Refilled baclofen.  Prozac was discontinued by Posey Pronto. Notified Ms Silverstone

## 2016-09-16 ENCOUNTER — Ambulatory Visit: Payer: Managed Care, Other (non HMO) | Attending: Physical Medicine & Rehabilitation | Admitting: Occupational Therapy

## 2016-09-16 ENCOUNTER — Ambulatory Visit: Payer: Managed Care, Other (non HMO) | Admitting: Physical Medicine & Rehabilitation

## 2016-09-16 ENCOUNTER — Encounter: Payer: Self-pay | Admitting: *Deleted

## 2016-09-16 ENCOUNTER — Ambulatory Visit: Payer: Managed Care, Other (non HMO) | Admitting: *Deleted

## 2016-09-16 VITALS — BP 120/82

## 2016-09-16 DIAGNOSIS — R278 Other lack of coordination: Secondary | ICD-10-CM | POA: Diagnosis present

## 2016-09-16 DIAGNOSIS — I69352 Hemiplegia and hemiparesis following cerebral infarction affecting left dominant side: Secondary | ICD-10-CM

## 2016-09-16 DIAGNOSIS — R471 Dysarthria and anarthria: Secondary | ICD-10-CM | POA: Insufficient documentation

## 2016-09-16 DIAGNOSIS — I69115 Cognitive social or emotional deficit following nontraumatic intracerebral hemorrhage: Secondary | ICD-10-CM | POA: Diagnosis present

## 2016-09-16 DIAGNOSIS — R2689 Other abnormalities of gait and mobility: Secondary | ICD-10-CM | POA: Insufficient documentation

## 2016-09-16 DIAGNOSIS — M25512 Pain in left shoulder: Secondary | ICD-10-CM | POA: Diagnosis present

## 2016-09-16 DIAGNOSIS — R41841 Cognitive communication deficit: Secondary | ICD-10-CM | POA: Insufficient documentation

## 2016-09-16 NOTE — Therapy (Signed)
[facility] [address] [place], [place], [address] Phone: [phone]   Fax:  [phone]  Occupational Therapy Treatment  Patient Details  Name: Abigail Campos MRN: [id number] Date of Birth: 08-29-1971 Referring Provider: Dr. Posey Pronto  Encounter Date: 09/16/2016      OT End of Session - 09/16/16 1659    Visit Number 4   Number of Visits 25   Date for OT Re-Evaluation 10/09/16   Authorization Type Aetna (240 visit limit combined with home health, estimated HH used 24 combined), no auth required    OT Start Time 1320   OT Stop Time 1400   OT Time Calculation (min) 40 min      Past Medical History:  Diagnosis Date  . Essential hypertension 10/19/2007   03-2010: metoprolol changed to bystolic (was not feeling well on it, no specific allergy or reaction)    . Headache(784.0)    developed migraines after 2nd pregnancy, had a negative CT of the head  . Hypertension   . Stroke [facility])     Past Surgical History:  Procedure Laterality Date  . ABDOMINAL HYSTERECTOMY  11-14-07   no oophorectomy per surgical report  . CESAREAN SECTION     [id number]  . INGUINAL HERNIA REPAIR     2004    Vitals:   09/16/16 1325  BP: 120/82        Subjective Assessment - 09/16/16 1646    Pertinent History see Epic   Patient Stated Goals to be able to use left arm   Currently in Pain? No/denies         Treatment:Seated edge of mat, body on arm movements over left elbow, lateral weightshifts, min-mod facilitation and verbal cues to avoid sliding off mat. Weigtbearing through Gentle passive stretching To left hand and wrist in extension. NMES 50 pps 250 pw, 10 secs cycle x 10 mins intensity 30 to wrist and finger extensors, with therapist facilitating finger and wrist extension.                       OT Short Term Goals - 08/11/16 1755      OT SHORT TERM GOAL #1   Title Pt/ caregiver will be I with HEP.    Time 6   Period Weeks   Status New     OT SHORT TERM GOAL #2   Title Pt/ family will verbalize understanding of LUE positioning to minimize pain and risk for injury, including splinting PRN.   Time 6   Period Weeks   Status New     OT SHORT TERM GOAL #3   Title Pt will donn shirt with min A.   Time 6   Period Weeks   Status New     OT SHORT TERM GOAL #4   Title Pt will demonstrate 90* P/ROM shoulder flexion in LUE with pain less than or equal to 3/10.   Time 6   Period Weeks     OT SHORT TERM GOAL #5   Title Pt will perform simple snack prep modified independently.   Time 6   Period Weeks   Status New     Additional Short Term Goals   Additional Short Term Goals Yes     OT SHORT TERM GOAL #6   Title Pt will perform bathing at a supervision level   Time 6   Period Weeks   Status New  OT Long Term Goals - 08/11/16 1759      OT LONG TERM GOAL #1   Title Pt will perform LB dressing with min A.   Time 12   Period Weeks   Status New     OT LONG TERM GOAL #2   Title Pt will use LUE as a stabilizer/ gross A with min A.   Time 12   Period Weeks   Status New     OT LONG TERM GOAL #3   Title Pt will perform simple home management tasks with min A.   Time 6   Period Weeks   Status New     OT LONG TERM GOAL #4   Title Pt will demonstrate adequate standing balance to retrieve a light weight object from overhead cabinet modified independently with RUE without LOB in prep for IADLS.   Time 12   Period Weeks   Status New     OT LONG TERM GOAL #5   Title Pt will perform shower transfers modified independently   Time 12   Period Weeks   Status New               Plan - 09/16/16 1656    Clinical Impression Statement Pt is progressing toward goals. She demonstrates decreased LUE pain and improving left side awareness.   Rehab Potential Good   Clinical Impairments Affecting Rehab Potential severity of deficits   OT Frequency 2x / week   OT  Duration 12 weeks   OT Treatment/Interventions Self-care/ADL training;Moist Heat;Fluidtherapy;DME and/or AE instruction;Splinting;Patient/family education;Balance Financial planner;Contrast Bath;Ultrasound;Therapeutic exercise;Therapeutic activities;Cognitive remediation/compensation;Passive range of motion;Functional Mobility Training;Neuromuscular education;Cryotherapy;Electrical Stimulation;Parrafin;Energy conservation;Manual Therapy;Visual/perceptual remediation/compensation   Plan continue estim, body on arm movements ( with direct supervision due to poor body awareness/ fall risk), consider UE ranger or stool ex   OT Home Exercise Plan 09/07/16:  resting splint wear/care   Consulted and Agree with Plan of Care Patient;Family member/caregiver      Patient will benefit from skilled therapeutic intervention in order to improve the following deficits and impairments:     Visit Diagnosis: Hemiplegia and hemiparesis following cerebral infarction affecting left dominant side ([place])  Other lack of coordination  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 Left flaccid hemiplegia 06/24/2016  . Ear itching   . Poor fluid intake   . Bradycardia, drug induced   . Poor nutrition   . Muscle spasticity   . Obesity (BMI 30.0-34.9) 04/08/2016  . Hyperlipidemia 04/08/2016  . Basal ganglia hemorrhage ([place]) 04/08/2016  . Aphasia as late effect of cerebrovascular accident   . Dysphagia as late effect of cerebrovascular disease   . Migraine with aura and without status migrainosus, not intractable   . HLD (hyperlipidemia)   . Benign essential HTN   . Gait disturbance, post-stroke   . Hemiplegia, post-stroke ([place])   . Hypokalemia   . Dysphagia, post-stroke   . Aphasia, post-stroke   . Bradycardia   . Headache, migraine   . Dysarthria, post-stroke   . Encephalopathy acute  04/02/2016  . ICH (intracerebral hemorrhage) (HCC) - R basal ganglia due to hypertensive emergency 04/01/2016  . Upper airway cough syndrome 12/06/2015  . PCP NOTES >>> 09/25/2015  . Need for hepatitis B vaccination 10/19/2014  . Insomnia 12/28/2012  . Annual physical exam 11/23/2011  . Hemorrhoids 10/13/2011  . Morbid obesity ([place]) 02/26/2011  . CHEST PAIN UNSPECIFIED  03/28/2010  . Essential hypertension 10/19/2007  . Headache(784.0) 05/12/2007    RINE,KATHRYN 09/16/2016, 5:00 PM  [facility] [address] [place], [place], [address] Phone: [phone]   Fax:  [phone]  Name: LAKERRIA HOOF MRN: [id number] Date of Birth: 1971/05/04

## 2016-09-16 NOTE — Therapy (Signed)
[facility] [address] [place], [place], [address] Phone: [phone]   Fax:  [phone]  Speech Language Pathology Treatment  Patient Details  Name: Abigail Campos MRN: [id number] Date of Birth: 04-23-1971 Referring Provider: Delice Lesch, M.D.  Encounter Date: 09/16/2016      End of Session - 09/16/16 1515    Visit Number 7   Number of Visits 16   Date for SLP Re-Evaluation 10/23/16   SLP Start Time 1400   SLP Stop Time  1445   SLP Time Calculation (min) 45 min   Activity Tolerance Patient tolerated treatment well      Past Medical History:  Diagnosis Date  . Essential hypertension 10/19/2007   03-2010: metoprolol changed to bystolic (was not feeling well on it, no specific allergy or reaction)    . Headache(784.0)    developed migraines after 2nd pregnancy, had a negative CT of the head  . Hypertension   . Stroke [facility])     Past Surgical History:  Procedure Laterality Date  . ABDOMINAL HYSTERECTOMY  11-14-07   no oophorectomy per surgical report  . CESAREAN SECTION     [id number]  . INGUINAL HERNIA REPAIR     2004    There were no vitals filed for this visit.      Subjective Assessment - 09/16/16 1507    Subjective "That was really hard" (being in the gym during cognitive task)   Currently in Pain? No/denies               ADULT SLP TREATMENT - 09/16/16 0001      General Information   Behavior/Cognition Alert;Cooperative;Pleasant mood   Patient Positioning Upright in chair   Oral care provided N/A     Treatment Provided   Treatment provided Cognitive-Linquistic     Pain Assessment   Pain Assessment No/denies pain     Cognitive-Linquistic Treatment   Treatment focused on Cognition   Skilled Treatment Pt was seen for continued skilled ST intervention, focusing on goals for improvement of awareness, higher levels of attention, thought organization, and self correction. Pt was  able to verbalize areas of work, including decreased speech rate, reading. memory, sequencing, and focus. Pt was able to complete deductive reasoning puzzle #1 in the gym, a distracting environment with music and several therapy sessions ongoing. Min assist needed to complete the task, and pt required no cues to redirect attention in this distracting environment. Pt discussed premorbid lifestyle, including being wife and mother (58 and 50 year olds), working at a childcare center, and going to school for [employer] billing/coding. Pt reports she loves to read, and enjoyed cooking prior to her CVA. Homework was given for pt to read a chapter and prepare a lesson on it, to facilitate reading comprehension and retention.      Assessment / Recommendations / Plan   Plan Continue with current plan of care     Progression Toward Goals   Progression toward goals Progressing toward goals          SLP Education - 09/16/16 1514    Education provided Yes   Education Details Homework activity to read a chapter and prepare a lession on it.   Person(s) Educated Patient   Methods Explanation;Demonstration;Handout   Comprehension Verbalized understanding          SLP Short Term Goals - 09/16/16 1517      SLP SHORT TERM GOAL #1   Title pt will name 3 deficit  areas over three sessions   Time 2   Period Weeks   Status On-going     SLP SHORT TERM GOAL #2   Title pt will demo selective attention to therapy tasks for 10 minutes in min-mod noisy environment   Time 2   Period Weeks   Status On-going     SLP SHORT TERM GOAL #3   Title pt will complete tasks of simple cognitive linguistic organization with 90% success and rare min A   Status Achieved     SLP SHORT TERM GOAL #4   Title pt will attempt to self correct misarticulated speech in 50% of opportunities in structured speech tasks   Time 2   Period Weeks   Status On-going          SLP Long Term Goals - 09/16/16 1518      SLP LONG TERM  GOAL #1   Title pt will demo alternating attention in simple cognitive linguistic tasks with appropriate switch times, with compensations   Time 6   Period Weeks   Status On-going     SLP LONG TERM GOAL #2   Title pt will demo emergent awareness in cognitive linguistic tasks by self correcting errors in 80% of opportunities with nonverbal cues   Time 6   Period Weeks   Status On-going     SLP LONG TERM GOAL #3   Title pt will demo functional organization in mod complex linguistic tasks with modified independence   Time 6   Period Weeks   Status On-going     SLP LONG TERM GOAL #4   Title pt will demo appropriate rate of speech in 5 minutes simple to mod complex conversation with occasional nonverbal cues   Time 6   Period Weeks   Status On-going     SLP LONG TERM GOAL #5   Title pt will demo HEP for dysarthria with min A   Time 6   Period Weeks   Status On-going          Plan - 09/16/16 1515    Clinical Impression Statement Pt pleasant and cooperative with unfamiliar therapist. Pt tolerated distracting environment well during attention/reasoning task. Pt would benefit from continued skilled ST intervention with focus on higher level cognitive goals for return to premorbid level of functioning.   Speech Therapy Frequency 2x / week   Duration --  6 weeks   Treatment/Interventions Compensatory strategies;Patient/family education;Functional tasks;Cueing hierarchy;Cognitive reorganization;Internal/external aids;SLP instruction and feedback;Language facilitation   Potential to Achieve Goals Good   Potential Considerations Severity of impairments   Consulted and Agree with Plan of Care Patient      Patient will benefit from skilled therapeutic intervention in order to improve the following deficits and impairments:   Cognitive social or emotional deficit following nontraumatic intracerebral hemorrhage  Cognitive communication deficit  Dysarthria and anarthria    Problem  List Patient Active Problem List   Diagnosis Date Noted  . Left flaccid hemiplegia 06/24/2016  . Ear itching   . Poor fluid intake   . Bradycardia, drug induced   . Poor nutrition   . Muscle spasticity   . Obesity (BMI 30.0-34.9) 04/08/2016  . Hyperlipidemia 04/08/2016  . Basal ganglia hemorrhage ([place]) 04/08/2016  . Aphasia as late effect of cerebrovascular accident   . Dysphagia as late effect of cerebrovascular disease   . Migraine with aura and without status migrainosus, not intractable   . HLD (hyperlipidemia)   . Benign essential HTN   .  Gait disturbance, post-stroke   . Hemiplegia, post-stroke ([place])   . Hypokalemia   . Dysphagia, post-stroke   . Aphasia, post-stroke   . Bradycardia   . Headache, migraine   . Dysarthria, post-stroke   . Encephalopathy acute 04/02/2016  . ICH (intracerebral hemorrhage) (HCC) - R basal ganglia due to hypertensive emergency 04/01/2016  . Upper airway cough syndrome 12/06/2015  . PCP NOTES >>> 09/25/2015  . Need for hepatitis B vaccination 10/19/2014  . Insomnia 12/28/2012  . Annual physical exam 11/23/2011  . Hemorrhoids 10/13/2011  . Morbid obesity ([place]) 02/26/2011  . CHEST PAIN UNSPECIFIED 03/28/2010  . Essential hypertension 10/19/2007  . Headache(784.0) 05/12/2007   Yehya Brendle B. [place], MSP, CCC-SLP  Shonna Chock 09/16/2016, 3:21 PM  [facility] [address] [place], [place], [address] Phone: [phone]   Fax:  [phone]   Name: Abigail Campos MRN: [id number] Date of Birth: November 14, 1971

## 2016-09-17 ENCOUNTER — Ambulatory Visit: Payer: Managed Care, Other (non HMO) | Admitting: Occupational Therapy

## 2016-09-17 ENCOUNTER — Ambulatory Visit: Payer: Managed Care, Other (non HMO)

## 2016-09-17 DIAGNOSIS — R2689 Other abnormalities of gait and mobility: Secondary | ICD-10-CM

## 2016-09-17 DIAGNOSIS — I69352 Hemiplegia and hemiparesis following cerebral infarction affecting left dominant side: Secondary | ICD-10-CM | POA: Diagnosis not present

## 2016-09-17 DIAGNOSIS — M25512 Pain in left shoulder: Secondary | ICD-10-CM

## 2016-09-17 NOTE — Patient Instructions (Signed)
Functional Quadriceps: Sit to Stand    Have someone there with you for safety. Have a sturdy chair in front of you and place a 2 inch book under right foot, in order to promote weight shifting onto Left leg. Sit on edge of chair, feet flat on floor. Stand upright, extending knees fully. Repeat __10__ times per set. Do __1__ sets per session. Do __1__ sessions per day.  http://orth.exer.us/735   Copyright  VHI. All rights reserved.   Bridge    Bring belly button to spine. Lie back, legs bent. Inhale, pressing hips up. Keeping ribs in, lengthen lower back, then brings hips back to mat/bed. Make sure to keep your left leg from going out to the side. Repeat _10___ times. Do __3__ sessions per day.   http://pm.exer.us/55   Copyright  VHI. All rights reserved.   Heel Slide    Place sheet around right foot and then bend right knee and pull heel toward buttocks. Hold _2___ seconds. Return. Repeat __10__ times. Do __2__ sessions per day.  http://gt2.exer.us/372   Copyright  VHI. All rights reserved.   Straight Leg Raise    Tighten stomach and slowly raise locked left leg __6__ inches from floor. Repeat __10__ times per set. Do __3__ sets per session. Do __1__ sessions per day.  http://orth.exer.us/1103   Copyright  VHI. All rights reserved.   Toe / Heel Raise (Sitting)    Sitting, raise heels, then rock back on heels and raise toes. Repeat __20__ times. Perform every day.  Copyright  VHI. All rights reserved.   ABDUCTION: Sitting (Active)    Sit with feet flat. Lift left leg slightly and draw it out to side. Then return to center. Complete _3__ sets of _10__ repetitions. Perform __1_ sessions per day.  Copyright  VHI. All rights reserved.

## 2016-09-17 NOTE — Therapy (Signed)
[facility] [address] [place], [place], [address] Phone: [phone]   Fax:  [phone]  Occupational Therapy Treatment  Patient Details  Name: Abigail Campos MRN: [id number] Date of Birth: [birth year] Referring Provider: Dr. Posey Pronto  Encounter Date: 09/17/2016      OT End of Session - 09/17/16 1652    Visit Number 5   Number of Visits 25   Date for OT Re-Evaluation 10/09/16   Authorization Type [employer] (240 visit limit combined with home health, estimated HH used 24 combined), no auth required    Authorization Time Period await clarification of remaining visits   OT Start Time 1445   OT Stop Time 1530   OT Time Calculation (min) 45 min   Activity Tolerance Patient tolerated treatment well      Past Medical History:  Diagnosis Date  . Essential hypertension 10/19/2007   03-2010: metoprolol changed to bystolic (was not feeling well on it, no specific allergy or reaction)    . Headache(784.0)    developed migraines after 2nd pregnancy, had a negative CT of the head  . Hypertension   . Stroke [facility])     Past Surgical History:  Procedure Laterality Date  . ABDOMINAL HYSTERECTOMY  11-14-07   no oophorectomy per surgical report  . CESAREAN SECTION     [id number]  . INGUINAL HERNIA REPAIR     2004    There were no vitals filed for this visit.      Subjective Assessment - 09/17/16 1455    Pertinent History see Epic   Patient Stated Goals to be able to use left arm   Currently in Pain? No/denies                      OT Treatments/Exercises (OP) - 09/17/16 0001      Neurological Re-education Exercises   Other Exercises 1 Closed chain activity with LUE positioned in approx. 70* flex while actively pushing with shoulder and elbow activation requiring mod assist and facilitation.    Other Exercises 2 Low range AA/ROM with use of tilted stool with mod facilitation LUE and tactile cues to prevent  trunk compensations   Other Weight-Bearing Exercises 1 Seated: wt bearing through LUE while performing lateral trunk flexion while reaching with RUE. Also wt bearing through LUE with trunk rotation movements bilaterally     Modalities   Modalities Electrical Stimulation     Electrical Stimulation   Electrical Stimulation Location dorsal forearm   Electrical Stimulation Action wrist and finger extension   Electrical Stimulation Parameters 50 pps, 250 pw, 10 sec. on/off cycle x 10 mintues   Electrical Stimulation Goals Neuromuscular facilitation                  OT Short Term Goals - 08/11/16 1755      OT SHORT TERM GOAL #1   Title Pt/ caregiver will be I with HEP.   Time 6   Period Weeks   Status New     OT SHORT TERM GOAL #2   Title Pt/ family will verbalize understanding of LUE positioning to minimize pain and risk for injury, including splinting PRN.   Time 6   Period Weeks   Status New     OT SHORT TERM GOAL #3   Title Pt will donn shirt with min A.   Time 6   Period Weeks   Status New     OT SHORT TERM GOAL #4  Title Pt will demonstrate 90* P/ROM shoulder flexion in LUE with pain less than or equal to 3/10.   Time 6   Period Weeks     OT SHORT TERM GOAL #5   Title Pt will perform simple snack prep modified independently.   Time 6   Period Weeks   Status New     Additional Short Term Goals   Additional Short Term Goals Yes     OT SHORT TERM GOAL #6   Title Pt will perform bathing at a supervision level   Time 6   Period Weeks   Status New           OT Long Term Goals - 08/11/16 1759      OT LONG TERM GOAL #1   Title Pt will perform LB dressing with min A.   Time 12   Period Weeks   Status New     OT LONG TERM GOAL #2   Title Pt will use LUE as a stabilizer/ gross A with min A.   Time 12   Period Weeks   Status New     OT LONG TERM GOAL #3   Title Pt will perform simple home management tasks with min A.   Time 6   Period Weeks    Status New     OT LONG TERM GOAL #4   Title Pt will demonstrate adequate standing balance to retrieve a light weight object from overhead cabinet modified independently with RUE without LOB in prep for IADLS.   Time 12   Period Weeks   Status New     OT LONG TERM GOAL #5   Title Pt will perform shower transfers modified independently   Time 12   Period [place] - 09/17/16 1653    Clinical Impression Statement Pt responds well to estim for finger extension. Pt limited by severity of hemiplegia dominated by synergy pattern and little active movement.    Rehab Potential Good   Clinical Impairments Affecting Rehab Potential severity of deficits   OT Frequency 2x / week   OT Duration 12 weeks   OT Treatment/Interventions Self-care/ADL training;Moist Heat;Fluidtherapy;DME and/or AE instruction;Splinting;Patient/family education;Balance Financial planner;Contrast Bath;Ultrasound;Therapeutic exercise;Therapeutic activities;Cognitive remediation/compensation;Passive range of motion;Functional Mobility Training;Neuromuscular education;Cryotherapy;Electrical Stimulation;Parrafin;Energy conservation;Manual Therapy;Visual/perceptual remediation/compensation   Plan continue NMR, estim, show pt/family A/E options for shoelaces (shoe buttons, velcro converters, etc)   OT Home Exercise Plan 09/07/16:  resting splint wear/care   Consulted and Agree with Plan of Care Patient;Family member/caregiver   Family Member Consulted husband      Patient will benefit from skilled therapeutic intervention in order to improve the following deficits and impairments:  Abnormal gait, Decreased coordination, Decreased range of motion, Difficulty walking, Impaired flexibility, Impaired sensation, Increased edema, Decreased safety awareness, Decreased endurance, Decreased activity tolerance, Decreased knowledge of precautions, Increased fascial restricitons, Impaired tone, Pain,  Impaired UE functional use, Decreased knowledge of use of DME, Decreased balance, Decreased cognition, Decreased mobility, Decreased strength, Impaired perceived functional ability, Impaired vision/preception  Visit Diagnosis: Hemiplegia and hemiparesis following cerebral infarction affecting left dominant side (HCC)  Acute pain of left shoulder    Problem List Patient Active Problem List   Diagnosis Date Noted  . Left flaccid hemiplegia 06/24/2016  . Ear itching   . Poor fluid intake   . Bradycardia, drug induced   . Poor nutrition   . Muscle spasticity   .  Obesity (BMI 30.0-34.9) 04/08/2016  . Hyperlipidemia 04/08/2016  . Basal ganglia hemorrhage ([place]) 04/08/2016  . Aphasia as late effect of cerebrovascular accident   . Dysphagia as late effect of cerebrovascular disease   . Migraine with aura and without status migrainosus, not intractable   . HLD (hyperlipidemia)   . Benign essential HTN   . Gait disturbance, post-stroke   . Hemiplegia, post-stroke ([place])   . Hypokalemia   . Dysphagia, post-stroke   . Aphasia, post-stroke   . Bradycardia   . Headache, migraine   . Dysarthria, post-stroke   . Encephalopathy acute 04/02/2016  . ICH (intracerebral hemorrhage) (HCC) - R basal ganglia due to hypertensive emergency 04/01/2016  . Upper airway cough syndrome 12/06/2015  . PCP NOTES >>> 09/25/2015  . Need for hepatitis B vaccination 10/19/2014  . Insomnia 12/28/2012  . Annual physical exam 11/23/2011  . Hemorrhoids 10/13/2011  . Morbid obesity ([place]) 02/26/2011  . CHEST PAIN UNSPECIFIED 03/28/2010  . Essential hypertension 10/19/2007  . Headache(784.0) 05/12/2007    Carey Bullocks, OTR/L 09/17/2016, 4:56 PM  [facility] [address] [place], [place], [address] Phone: [phone]   Fax:  [phone]  Name: Abigail Campos MRN: [id number] Date of Birth: Apr 23, 1971

## 2016-09-17 NOTE — Therapy (Signed)
[facility] [address] [place], [place], [address] Phone: [phone]   Fax:  [phone]  Physical Therapy Treatment  Patient Details  Name: Abigail Campos MRN: [id number] Date of Birth: September 03, 1971 Referring Provider: Dr. Posey Pronto  Encounter Date: 09/17/2016      PT End of Session - 09/17/16 1620    Visit Number 8   Number of Visits 17   Date for PT Re-Evaluation 10/10/16   Authorization Type [employer]-240 visit limit (including HH and OP). Per Lattie Haw, pt used 24 total HHPT, [place], and [place] visits.   PT Start Time 1535  pt finishing with OT   PT Stop Time 1615   PT Time Calculation (min) 40 min   Equipment Utilized During Treatment Gait belt   Activity Tolerance Patient tolerated treatment well   Behavior During Therapy WFL for tasks assessed/performed      Past Medical History:  Diagnosis Date  . Essential hypertension 10/19/2007   03-2010: metoprolol changed to bystolic (was not feeling well on it, no specific allergy or reaction)    . Headache(784.0)    developed migraines after 2nd pregnancy, had a negative CT of the head  . Hypertension   . Stroke [facility])     Past Surgical History:  Procedure Laterality Date  . ABDOMINAL HYSTERECTOMY  11-14-07   no oophorectomy per surgical report  . CESAREAN SECTION     [id number]  . INGUINAL HERNIA REPAIR     2004    There were no vitals filed for this visit.      Subjective Assessment - 09/17/16 1537    Subjective Pt denied falls or changes since last visit. Pt still has not had appt with Dr. Posey Pronto, as he is gone (his wife had a baby) but pt has an appt at the end of the month.    Patient is accompained by: Family member  husband-Jay   Pertinent History HTN, hyperlipidemia, hx of migraine   Patient Stated Goals I want to walk and stand up without falling.    Currently in Pain? No/denies         Therex:  Pt performed previous and progressed  strengthening HEP with cues for technique. Please see pt instructions for details.                         PT Education - 09/17/16 1620    Education provided Yes   Education Details PT progressed pt's strengthening HEP.   Person(s) Educated Patient;Spouse   Methods Explanation;Demonstration;Tactile cues;Verbal cues;Handout   Comprehension Returned demonstration;Verbalized understanding          PT Short Term Goals - 09/17/16 1641      PT SHORT TERM GOAL #1   Title Pt will be IND in performing HEP to improve strength and balance. TARGET DATE FOR ALL STGS: 09/08/16   Status Partially Met     PT SHORT TERM GOAL #2   Title Perform BERG and write goals if appropriate.   Status Achieved     PT SHORT TERM GOAL #3   Title Pt will amb. 200' over even terrain with LRAD at MOD I level to improve functional mobility.    Status Partially Met     PT SHORT TERM GOAL #4   Title Pt will improve gait speed to 0.72f/sec with LRAD to decrease falls risk.    Status Not Met     PT SHORT TERM GOAL #5   Title  Pt will improve BERG score to >/=21/56 to decr. falls risk.    Status Achieved           PT Long Term Goals - 09/09/16 1501      PT LONG TERM GOAL #1   Title Pt will verbalize understanding or CVA risk factors and signs/symptoms to reduce risk of additional CVA. TARGET DATE FOR ALL LTGS: 10/06/16   Status On-going     PT LONG TERM GOAL #2   Title Pt will improve gait speed to >/=1.37f/sec. with LRAD to reduce falls risk.    Status On-going     PT LONG TERM GOAL #3   Title Pt will amb. 400' over even/paved surfaces with LRAD at MOD I level to improve functional mobility.    Status On-going     PT LONG TERM GOAL #4   Title Pt will perform TUG with LRAD in </=30sec. to improve function and decr. falls risk.    Status On-going     PT LONG TERM GOAL #5   Title Pt will perform stand pivot txfs from chair to w/c at MOD I level to improve functional mobility.     Status On-going     PT LONG TERM GOAL #6   Title Pt will improve BERG score to >/=31/56 to decr. falls risk.    Baseline 27/56 on 09/07/16, therefore, PT revised long term BERG goal from 25/56 to [place]- 09/17/16 1621    Clinical Impression Statement Pt partially met STG 1 (HEP), as she was able to verbalize sets/reps but required cues for technique. Pt demonstrated improved weight shifting onto LLE with 2" block placed under RLE during STS txfs. Pt would continue to benefit from skilled PT to improve safety during functional mobility.    Rehab Potential Good   Clinical Impairments Affecting Rehab Potential co-morbidities   PT Frequency 2x / week   PT Duration 8 weeks   PT Treatment/Interventions ADLs/Self Care Home Management;Biofeedback;Canalith Repostioning;Electrical Stimulation;Cognitive remediation;Neuromuscular re-education;Balance training;Therapeutic exercise;Therapeutic activities;Functional mobility training;Stair training;Gait training;DME Instruction;Orthotic Fit/Training;Patient/family education;Vestibular;Manual techniques   PT Next Visit Plan [facility] (Gerald St[place] be present or pt will have AFO before appt).   PT Home Exercise Plan Strengthening HEP.    Consulted and Agree with Plan of Care Patient;Family member/caregiver   Family Member Consulted pt's mom: LVaughan Basta     Patient will benefit from skilled therapeutic intervention in order to improve the following deficits and impairments:  Abnormal gait, Decreased endurance, Decreased knowledge of use of DME, Decreased strength, Impaired UE functional use, Impaired sensation, Decreased balance, Decreased mobility, Decreased range of motion, Decreased coordination, Impaired flexibility, Decreased cognition  Visit Diagnosis: Hemiplegia and hemiparesis following cerebral infarction affecting left dominant side (HCC)  Other abnormalities of  gait and mobility     Problem List Patient Active Problem List   Diagnosis Date Noted  . Left flaccid hemiplegia 06/24/2016  . Ear itching   . Poor fluid intake   . Bradycardia, drug induced   . Poor nutrition   . Muscle spasticity   . Obesity (BMI 30.0-34.9) 04/08/2016  . Hyperlipidemia 04/08/2016  . Basal ganglia hemorrhage ([place] 04/08/2016  . Aphasia as late effect of cerebrovascular accident   . Dysphagia as late effect of cerebrovascular disease   . Migraine with aura and without status migrainosus, not intractable   .  HLD (hyperlipidemia)   . Benign essential HTN   . Gait disturbance, post-stroke   . Hemiplegia, post-stroke ([place])   . Hypokalemia   . Dysphagia, post-stroke   . Aphasia, post-stroke   . Bradycardia   . Headache, migraine   . Dysarthria, post-stroke   . Encephalopathy acute 04/02/2016  . ICH (intracerebral hemorrhage) (HCC) - R basal ganglia due to hypertensive emergency 04/01/2016  . Upper airway cough syndrome 12/06/2015  . PCP NOTES >>> 09/25/2015  . Need for hepatitis B vaccination 10/19/2014  . Insomnia 12/28/2012  . Annual physical exam 11/23/2011  . Hemorrhoids 10/13/2011  . Morbid obesity ([place]) 02/26/2011  . CHEST PAIN UNSPECIFIED 03/28/2010  . Essential hypertension 10/19/2007  . Headache(784.0) 05/12/2007    Nhi Butrum L 09/17/2016, 4:41 PM  [facility] [address] [place], [place], [address] Phone: [phone]   Fax:  [phone]  Name: JYLIAN PAPPALARDO MRN: [id number] Date of Birth: December 03, 1971  Geoffry Paradise, PT,DPT 09/17/16 4:43 PM Phone: [phone] Fax: [phone]

## 2016-09-18 ENCOUNTER — Other Ambulatory Visit: Payer: Self-pay | Admitting: Internal Medicine

## 2016-09-18 ENCOUNTER — Ambulatory Visit: Payer: Managed Care, Other (non HMO)

## 2016-09-18 ENCOUNTER — Ambulatory Visit: Payer: Managed Care, Other (non HMO) | Admitting: *Deleted

## 2016-09-18 DIAGNOSIS — I69352 Hemiplegia and hemiparesis following cerebral infarction affecting left dominant side: Secondary | ICD-10-CM

## 2016-09-18 DIAGNOSIS — R2689 Other abnormalities of gait and mobility: Secondary | ICD-10-CM

## 2016-09-18 NOTE — Therapy (Signed)
[facility] [address] [place], [place], [address] Phone: [phone]   Fax:  [phone]  Physical Therapy Treatment  Patient Details  Name: Abigail Campos MRN: [id number] Date of Birth: 1971-11-20 Referring Provider: Dr. Posey Pronto  Encounter Date: 09/18/2016      PT End of Session - 09/18/16 1550    Visit Number 9   Number of Visits 17   Date for PT Re-Evaluation 10/10/16   Authorization Type A[employer]na-240 visit limit (including HH and OP). Per Lattie Haw, pt used 24 total HHPT, [place], and [place] visits.   PT Start Time 1403   PT Stop Time 1443   PT Time Calculation (min) 40 min   Equipment Utilized During Treatment Gait belt   Activity Tolerance Patient tolerated treatment well   Behavior During Therapy WFL for tasks assessed/performed      Past Medical History:  Diagnosis Date  . Essential hypertension 10/19/2007   03-2010: metoprolol changed to bystolic (was not feeling well on it, no specific allergy or reaction)    . Headache(784.0)    developed migraines after 2nd pregnancy, had a negative CT of the head  . Hypertension   . Stroke [facility])     Past Surgical History:  Procedure Laterality Date  . ABDOMINAL HYSTERECTOMY  11-14-07   no oophorectomy per surgical report  . CESAREAN SECTION     [id number]  . INGUINAL HERNIA REPAIR     2004    There were no vitals filed for this visit.      Subjective Assessment - 09/18/16 1415    Subjective Pt denied falls since last visit. Pt reported she received L AFO and toe cap from [employer] today.    Pertinent History HTN, hyperlipidemia, hx of migraine   Patient Stated Goals I want to walk and stand up without falling.    Currently in Pain? No/denies                         [facility] Adult PT Treatment/Exercise - 09/18/16 1415      Ambulation/Gait   Ambulation/Gait Yes   Ambulation/Gait Assistance 5: Supervision;4: Min guard   Ambulation/Gait  Assistance Details Pt amb. with new custom posterior AFO and toe cap on L shoe.    Ambulation Distance (Feet) 150 Feet  50'x2 hemi-walker, 50'x2 c LBQC and 10' c SBQC   Assistive device Hemi-walker   Gait Pattern Step-to pattern;Decreased stride length;Decreased stance time - left;Decreased step length - right;Decreased dorsiflexion - left;Left circumduction;Left genu recurvatum   Ambulation Surface Level;Indoor   Gait velocity 0.82f/sec. with hemi-walker and L custom AFO                PT Education - 09/18/16 1550    Education provided Yes   Education Details PT educated pt on sequencing with [facility] SBQC. PT educated pt that it would be best and safest to amb. with L AFO donned with shoes that have laces or straps.   Person(s) Educated Patient   Methods Explanation;Demonstration;Verbal cues   Comprehension Verbalized understanding;Returned demonstration;Need further instruction          PT Short Term Goals - 09/17/16 1641      PT SHORT TERM GOAL #1   Title Pt will be IND in performing HEP to improve strength and balance. TARGET DATE FOR ALL STGS: 09/08/16   Status Partially Met     PT SHORT TERM GOAL #2   Title Perform BERG and  write goals if appropriate.   Status Achieved     PT SHORT TERM GOAL #3   Title Pt will amb. 200' over even terrain with LRAD at MOD I level to improve functional mobility.    Status Partially Met     PT SHORT TERM GOAL #4   Title Pt will improve gait speed to 0.30f/sec with LRAD to decrease falls risk.    Status Not Met     PT SHORT TERM GOAL #5   Title Pt will improve BERG score to >/=21/56 to decr. falls risk.    Status Achieved           PT Long Term Goals - 09/09/16 1501      PT LONG TERM GOAL #1   Title Pt will verbalize understanding or CVA risk factors and signs/symptoms to reduce risk of additional CVA. TARGET DATE FOR ALL LTGS: 10/06/16   Status On-going     PT LONG TERM GOAL #2   Title Pt will improve gait speed to  >/=1.870fsec. with LRAD to reduce falls risk.    Status On-going     PT LONG TERM GOAL #3   Title Pt will amb. 400' over even/paved surfaces with LRAD at MOD I level to improve functional mobility.    Status On-going     PT LONG TERM GOAL #4   Title Pt will perform TUG with LRAD in </=30sec. to improve function and decr. falls risk.    Status On-going     PT LONG TERM GOAL #5   Title Pt will perform stand pivot txfs from chair to w/c at MOD I level to improve functional mobility.    Status On-going     PT LONG TERM GOAL #6   Title Pt will improve BERG score to >/=31/56 to decr. falls risk.    Baseline 27/56 on 09/07/16, therefore, PT revised long term BERG goal from 25/56 to [place] 09/18/16 1551    Clinical Impression Statement Pt demonstrated progress, as she was able to amb. longer distances at incr. gait speed with her custom L AFO and toe cap. Pt demonstrated incr. postural sway and incr. fear of falling during amb. with SBQC, therefore, PT will continue to gait train with hemi-walker and LBQC. Continue with POC.    Rehab Potential Good   Clinical Impairments Affecting Rehab Potential co-morbidities   PT Frequency 2x / week   PT Duration 8 weeks   PT Treatment/Interventions ADLs/Self Care Home Management;Biofeedback;Canalith Repostioning;Electrical Stimulation;Cognitive remediation;Neuromuscular re-education;Balance training;Therapeutic exercise;Therapeutic activities;Functional mobility training;Stair training;Gait training;DME Instruction;Orthotic Fit/Training;Patient/family education;Vestibular;Manual techniques   PT Next Visit Plan Continue gait training with [facility] new AFO    PT Home Exercise Plan Strengthening HEP.    Consulted and Agree with Plan of Care Patient;Family member/caregiver   Family Member Consulted pt's mom: [name]    Patient will benefit from skilled therapeutic intervention in order to improve the following  deficits and impairments:  Abnormal gait, Decreased endurance, Decreased knowledge of use of DME, Decreased strength, Impaired UE functional use, Impaired sensation, Decreased balance, Decreased mobility, Decreased range of motion, Decreased coordination, Impaired flexibility, Decreased cognition  Visit Diagnosis: Other abnormalities of gait and mobility  Hemiplegia and hemiparesis following cerebral infarction affecting left dominant side ([facility]    Problem List Patient Active Problem List   Diagnosis Date Noted  . Left flaccid hemiplegia 06/24/2016  .  Ear itching   . Poor fluid intake   . Bradycardia, drug induced   . Poor nutrition   . Muscle spasticity   . Obesity (BMI 30.0-34.9) 04/08/2016  . Hyperlipidemia 04/08/2016  . Basal ganglia hemorrhage ([place]) 04/08/2016  . Aphasia as late effect of cerebrovascular accident   . Dysphagia as late effect of cerebrovascular disease   . Migraine with aura and without status migrainosus, not intractable   . HLD (hyperlipidemia)   . Benign essential HTN   . Gait disturbance, post-stroke   . Hemiplegia, post-stroke ([place])   . Hypokalemia   . Dysphagia, post-stroke   . Aphasia, post-stroke   . Bradycardia   . Headache, migraine   . Dysarthria, post-stroke   . Encephalopathy acute 04/02/2016  . ICH (intracerebral hemorrhage) (HCC) - R basal ganglia due to hypertensive emergency 04/01/2016  . Upper airway cough syndrome 12/06/2015  . PCP NOTES >>> 09/25/2015  . Need for hepatitis B vaccination 10/19/2014  . Insomnia 12/28/2012  . Annual physical exam 11/23/2011  . Hemorrhoids 10/13/2011  . Morbid obesity ([place]) 02/26/2011  . CHEST PAIN UNSPECIFIED 03/28/2010  . Essential hypertension 10/19/2007  . Headache(784.0) 05/12/2007    Barry Culverhouse L 09/18/2016, 3:53 PM  [facility] [address] [place], [place], [address] Phone: [phone]   Fax:  [phone]  Name:  Abigail Campos MRN: [id number] Date of Birth: 1971-07-25  Geoffry Paradise, PT,DPT 09/18/16 3:54 PM Phone: [phone] Fax: [phone]

## 2016-09-22 ENCOUNTER — Ambulatory Visit: Payer: Managed Care, Other (non HMO)

## 2016-09-22 ENCOUNTER — Ambulatory Visit: Payer: Managed Care, Other (non HMO) | Admitting: Occupational Therapy

## 2016-09-22 DIAGNOSIS — I69352 Hemiplegia and hemiparesis following cerebral infarction affecting left dominant side: Secondary | ICD-10-CM

## 2016-09-22 DIAGNOSIS — M25512 Pain in left shoulder: Secondary | ICD-10-CM

## 2016-09-22 DIAGNOSIS — R278 Other lack of coordination: Secondary | ICD-10-CM

## 2016-09-22 DIAGNOSIS — R2689 Other abnormalities of gait and mobility: Secondary | ICD-10-CM

## 2016-09-22 NOTE — Therapy (Signed)
[facility] [address] [place], [place], [address] Phone: [phone]   Fax:  [phone]  Occupational Therapy Treatment  Patient Details  Name: Abigail Campos MRN: [id number] Date of Birth: 12-Aug-1971 Referring Provider: Dr. Posey Pronto  Encounter Date: 09/22/2016      OT End of Session - 09/22/16 1405    Visit Number 6   Number of Visits 25   Date for OT Re-Evaluation 12/09/16   Authorization Type Aetna (240 visit limit combined with home health, estimated HH used 24 combined), no auth required    Authorization Time Period wk 3/12   OT Start Time 1405   OT Stop Time 1445   OT Time Calculation (min) 40 min   Activity Tolerance Patient tolerated treatment well   Behavior During Therapy Flat affect      Past Medical History:  Diagnosis Date  . Essential hypertension 10/19/2007   03-2010: metoprolol changed to bystolic (was not feeling well on it, no specific allergy or reaction)    . Headache(784.0)    developed migraines after 2nd pregnancy, had a negative CT of the head  . Hypertension   . Stroke [facility])     Past Surgical History:  Procedure Laterality Date  . ABDOMINAL HYSTERECTOMY  11-14-07   no oophorectomy per surgical report  . CESAREAN SECTION     [id number]  . INGUINAL HERNIA REPAIR     2004    There were no vitals filed for this visit.      Subjective Assessment - 09/22/16 1716    Subjective  Pt reports no shoulder pain.  Pt able to verbalize how to position LUE in bed/in sitting   Pertinent History see Epic   Patient Stated Goals to be able to use left arm   Currently in Pain? No/denies        Neuro Re-ed:  Seated edge of mat, body on arm movements with left hand on mat including lateral weight shifts and trunk rotation, min-mod facilitation and verbal cues to move within pain free range.  Pt with incr ROM with repetition.  Gentle Weigtbearing through L hand on mat.  Scapular retraction  with min cues for alignment with UEs in abduction/and slight ER.     NMES 50 pps 250 pw, 10 secs cycle x 10 mins intensity 27 to wrist and finger extensors, with pt facilitating finger and wrist extension during on cycle.  No adverse reactions.                       OT Education - 09/22/16 1447    Education Details Use of AE for shoe fasteners (velcro converter, shoe buttons); Reviewed proper positioning of LUE (in bed and sitting)   Person(s) Educated Patient   Methods Explanation;Demonstration;Verbal cues;Handout   Comprehension Verbalized understanding;Returned demonstration  use of shoe buttons          OT Short Term Goals - 09/22/16 1443      OT SHORT TERM GOAL #1   Title Pt/ caregiver will be I with HEP.   Time 6   Period Weeks   Status New     OT SHORT TERM GOAL #2   Title Pt/ family will verbalize understanding of LUE positioning to minimize pain and risk for injury, including splinting PRN.   Time 6   Period Weeks   Status On-going     OT SHORT TERM GOAL #3   Title Pt will donn shirt with min  A.   Time 6   Period Weeks   Status New     OT SHORT TERM GOAL #4   Title Pt will demonstrate 90* P/ROM shoulder flexion in LUE with pain less than or equal to 3/10.   Time 6   Period Weeks     OT SHORT TERM GOAL #5   Title Pt will perform simple snack prep modified independently.   Time 6   Period Weeks   Status New     OT SHORT TERM GOAL #6   Title Pt will perform bathing at a supervision level   Time 6   Period Weeks   Status New           OT Long Term Goals - 09/22/16 1354      OT LONG TERM GOAL #1   Title Pt will perform LB dressing with min A.   Time 12   Period Weeks   Status New     OT LONG TERM GOAL #2   Title Pt will use LUE as a stabilizer/ gross A with min A.   Time 12   Period Weeks   Status New     OT LONG TERM GOAL #3   Title Pt will perform simple home management tasks with min A.   Time 6   Period Weeks    Status New     OT LONG TERM GOAL #4   Title Pt will demonstrate adequate standing balance to retrieve a light weight object from overhead cabinet modified independently with RUE without LOB in prep for IADLS.   Time 12   Period Weeks   Status New     OT LONG TERM GOAL #5   Title Pt will perform shower transfers modified independently   Time 12   Period Weeks   Status New               Plan - 09/22/16 1441    Clinical Impression Statement Pt progrssing towards goals with improving ADLs performance (per pt report) and decr pain.   Rehab Potential Good   Clinical Impairments Affecting Rehab Potential severity of deficits   OT Frequency 2x / week   OT Duration 12 weeks   OT Treatment/Interventions Self-care/ADL training;Moist Heat;Fluidtherapy;DME and/or AE instruction;Splinting;Patient/family education;Balance Financial planner;Contrast Bath;Ultrasound;Therapeutic exercise;Therapeutic activities;Cognitive remediation/compensation;Passive range of motion;Functional Mobility Training;Neuromuscular education;Cryotherapy;Electrical Stimulation;Parrafin;Energy conservation;Manual Therapy;Visual/perceptual remediation/compensation   Plan continue with neuro re-ed, ? UB dressing   OT Home Exercise Plan 09/07/16:  resting splint wear/care   Consulted and Agree with Plan of Care Patient;Family member/caregiver   Family Member Consulted husband      Patient will benefit from skilled therapeutic intervention in order to improve the following deficits and impairments:  Abnormal gait, Decreased coordination, Decreased range of motion, Difficulty walking, Impaired flexibility, Impaired sensation, Increased edema, Decreased safety awareness, Decreased endurance, Decreased activity tolerance, Decreased knowledge of precautions, Increased fascial restricitons, Impaired tone, Pain, Impaired UE functional use, Decreased knowledge of use of DME, Decreased balance, Decreased cognition, Decreased  mobility, Decreased strength, Impaired perceived functional ability, Impaired vision/preception  Visit Diagnosis: Hemiplegia and hemiparesis following cerebral infarction affecting left dominant side (HCC)  Acute pain of left shoulder  Other lack of coordination    Problem List Patient Active Problem List   Diagnosis Date Noted  . Left flaccid hemiplegia 06/24/2016  . Ear itching   . Poor fluid intake   . Bradycardia, drug induced   . Poor nutrition   . Muscle spasticity   .  Obesity (BMI 30.0-34.9) 04/08/2016  . Hyperlipidemia 04/08/2016  . Basal ganglia hemorrhage ([place]) 04/08/2016  . Aphasia as late effect of cerebrovascular accident   . Dysphagia as late effect of cerebrovascular disease   . Migraine with aura and without status migrainosus, not intractable   . HLD (hyperlipidemia)   . Benign essential HTN   . Gait disturbance, post-stroke   . Hemiplegia, post-stroke ([place])   . Hypokalemia   . Dysphagia, post-stroke   . Aphasia, post-stroke   . Bradycardia   . Headache, migraine   . Dysarthria, post-stroke   . Encephalopathy acute 04/02/2016  . ICH (intracerebral hemorrhage) (HCC) - R basal ganglia due to hypertensive emergency 04/01/2016  . Upper airway cough syndrome 12/06/2015  . PCP NOTES >>> 09/25/2015  . Need for hepatitis B vaccination 10/19/2014  . Insomnia 12/28/2012  . Annual physical exam 11/23/2011  . Hemorrhoids 10/13/2011  . Morbid obesity ([place]) 02/26/2011  . CHEST PAIN UNSPECIFIED 03/28/2010  . Essential hypertension 10/19/2007  . Headache(784.0) 05/12/2007    [facility] 09/22/2016, 5:18 PM  [facility] [address] [place], [place], [address] Phone: [phone]   Fax:  [phone]  Name: Abigail Campos MRN: [id number] Date of Birth: [birth year]   Vianne Bulls, OTR/L [facility] [address] [place], [place]  [address] [phone]  phone [phone] 09/22/16 5:18 PM

## 2016-09-22 NOTE — Therapy (Signed)
[facility] [address] [place], [place], [address] Phone: [phone]   Fax:  [phone]  Physical Therapy Treatment  Patient Details  Name: Abigail Campos MRN: [id number] Date of Birth: 17-Sep-1971 Referring Provider: Dr. Posey Pronto  Encounter Date: 09/22/2016      PT End of Session - 09/22/16 1622    Visit Number 10   Number of Visits 17   Date for PT Re-Evaluation 10/10/16   Authorization Type Aetna-240 visit limit (including HH and OP). Per Lattie Haw, pt used 24 total HHPT, [place], and [place] visits.   PT Start Time 1317   PT Stop Time 1359   PT Time Calculation (min) 42 min   Equipment Utilized During Treatment Gait belt   Activity Tolerance Patient tolerated treatment well   Behavior During Therapy WFL for tasks assessed/performed      Past Medical History:  Diagnosis Date  . Essential hypertension 10/19/2007   03-2010: metoprolol changed to bystolic (was not feeling well on it, no specific allergy or reaction)    . Headache(784.0)    developed migraines after 2nd pregnancy, had a negative CT of the head  . Hypertension   . Stroke [facility])     Past Surgical History:  Procedure Laterality Date  . ABDOMINAL HYSTERECTOMY  11-14-07   no oophorectomy per surgical report  . CESAREAN SECTION     [id number]  . INGUINAL HERNIA REPAIR     2004    There were no vitals filed for this visit.      Subjective Assessment - 09/22/16 1321    Subjective Pt denied falls or changes since last visit. Pt reported she went to church on Sunday.    Pertinent History HTN, hyperlipidemia, hx of migraine   Patient Stated Goals I want to walk and stand up without falling.    Currently in Pain? No/denies                         [facility] Adult PT Treatment/Exercise - 09/22/16 1322      Ambulation/Gait   Ambulation/Gait Yes   Ambulation/Gait Assistance 5: Supervision;4: Min guard   Ambulation/Gait Assistance  Details Cues to improve LLE stance time, R heel strike and R step length.   Ambulation Distance (Feet) 75 Feet  x2, 117'x2   Assistive device Hemi-walker   Gait Pattern Step-to pattern;Decreased stride length;Decreased stance time - left;Decreased step length - right;Decreased dorsiflexion - left;Left circumduction;Left genu recurvatum   Ambulation Surface Level;Indoor   Pre-Gait Activities Pt performed x5 reps of B marches with RUE support on hemi-walker to improve SLS.                  PT Short Term Goals - 09/17/16 1641      PT SHORT TERM GOAL #1   Title Pt will be IND in performing HEP to improve strength and balance. TARGET DATE FOR ALL STGS: 09/08/16   Status Partially Met     PT SHORT TERM GOAL #2   Title Perform BERG and write goals if appropriate.   Status Achieved     PT SHORT TERM GOAL #3   Title Pt will amb. 200' over even terrain with LRAD at MOD I level to improve functional mobility.    Status Partially Met     PT SHORT TERM GOAL #4   Title Pt will improve gait speed to 0.32f/sec with LRAD to decrease falls risk.    Status Not Met  PT SHORT TERM GOAL #5   Title Pt will improve BERG score to >/=21/56 to decr. falls risk.    Status Achieved           PT Long Term Goals - 09/09/16 1501      PT LONG TERM GOAL #1   Title Pt will verbalize understanding or CVA risk factors and signs/symptoms to reduce risk of additional CVA. TARGET DATE FOR ALL LTGS: 10/06/16   Status On-going     PT LONG TERM GOAL #2   Title Pt will improve gait speed to >/=1.3f/sec. with LRAD to reduce falls risk.    Status On-going     PT LONG TERM GOAL #3   Title Pt will amb. 400' over even/paved surfaces with LRAD at MOD I level to improve functional mobility.    Status On-going     PT LONG TERM GOAL #4   Title Pt will perform TUG with LRAD in </=30sec. to improve function and decr. falls risk.    Status On-going     PT LONG TERM GOAL #5   Title Pt will perform stand  pivot txfs from chair to w/c at MOD I level to improve functional mobility.    Status On-going     PT LONG TERM GOAL #6   Title Pt will improve BERG score to >/=31/56 to decr. falls risk.    Baseline 27/56 on 09/07/16, therefore, PT revised long term BERG goal from 25/56 to 3[place]- 09/22/16 1623    Clinical Impression Statement Pt demonstrated progress, as she was able to demonstrate improved LLE stance time and improved R step length with cues. Pt also required a seated rest break for PT and pt to adjust L AFO and sock positioning as pt reported slight discomfort 2/2 bulky socks. Pt continues to require rest breaks 2/2 fatigue. Pt would continue to benefit from skilled PT to improve safety during functional mobility.    Rehab Potential Good   Clinical Impairments Affecting Rehab Potential co-morbidities   PT Frequency 2x / week   PT Duration 8 weeks   PT Treatment/Interventions ADLs/Self Care Home Management;Biofeedback;Canalith Repostioning;Electrical Stimulation;Cognitive remediation;Neuromuscular re-education;Balance training;Therapeutic exercise;Therapeutic activities;Functional mobility training;Stair training;Gait training;DME Instruction;Orthotic Fit/Training;Patient/family education;Vestibular;Manual techniques   PT Next Visit Plan Continue gait training with hemi-walker and LBQC and new AFO. Trial heel/toe taps in // bars to improve proprioception and SLS.   PT Home Exercise Plan Strengthening HEP.    Consulted and Agree with Plan of Care Patient;Family member/caregiver   Family Member Consulted pt's mom: LVaughan Basta     Patient will benefit from skilled therapeutic intervention in order to improve the following deficits and impairments:  Abnormal gait, Decreased endurance, Decreased knowledge of use of DME, Decreased strength, Impaired UE functional use, Impaired sensation, Decreased balance, Decreased mobility, Decreased range of motion,  Decreased coordination, Impaired flexibility, Decreased cognition  Visit Diagnosis: Other abnormalities of gait and mobility  Hemiplegia and hemiparesis following cerebral infarction affecting left dominant side ([facility]     Problem List Patient Active Problem List   Diagnosis Date Noted  . Left flaccid hemiplegia 06/24/2016  . Ear itching   . Poor fluid intake   . Bradycardia, drug induced   . Poor nutrition   . Muscle spasticity   . Obesity (BMI 30.0-34.9) 04/08/2016  . Hyperlipidemia 04/08/2016  . [place]al [place] hemorrhage ([place] 04/08/2016  . Aphasia as late  effect of cerebrovascular accident   . Dysphagia as late effect of cerebrovascular disease   . Migraine with aura and without status migrainosus, not intractable   . HLD (hyperlipidemia)   . Benign essential HTN   . Gait disturbance, post-stroke   . Hemiplegia, post-stroke ([place])   . Hypokalemia   . Dysphagia, post-stroke   . Aphasia, post-stroke   . Bradycardia   . Headache, migraine   . Dysarthria, post-stroke   . Encephalopathy acute 04/02/2016  . ICH (intracerebral hemorrhage) (HCC) - R basal ganglia due to hypertensive emergency 04/01/2016  . Upper airway cough syndrome 12/06/2015  . PCP NOTES >>> 09/25/2015  . Need for hepatitis B vaccination 10/19/2014  . Insomnia 12/28/2012  . Annual physical exam 11/23/2011  . Hemorrhoids 10/13/2011  . Morbid obesity ([place]) 02/26/2011  . CHEST PAIN UNSPECIFIED 03/28/2010  . Essential hypertension 10/19/2007  . Headache(784.0) 05/12/2007    Yareliz Thorstenson L 09/22/2016, 4:25 PM  [facility] [address] [place], [place], [address] Phone: [phone]   Fax:  [phone]  Name: Abigail Campos MRN: [id number] Date of Birth: Sep 08, 1971  Geoffry Paradise, PT,DPT 09/22/16 4:26 PM Phone: [phone] Fax: [phone]

## 2016-09-23 ENCOUNTER — Encounter: Payer: Self-pay | Admitting: *Deleted

## 2016-09-23 ENCOUNTER — Telehealth: Payer: Self-pay | Admitting: Occupational Therapy

## 2016-09-23 ENCOUNTER — Ambulatory Visit: Payer: Managed Care, Other (non HMO) | Admitting: *Deleted

## 2016-09-23 ENCOUNTER — Ambulatory Visit: Payer: Managed Care, Other (non HMO) | Admitting: Occupational Therapy

## 2016-09-23 DIAGNOSIS — M25512 Pain in left shoulder: Secondary | ICD-10-CM

## 2016-09-23 DIAGNOSIS — I69352 Hemiplegia and hemiparesis following cerebral infarction affecting left dominant side: Secondary | ICD-10-CM | POA: Diagnosis not present

## 2016-09-23 DIAGNOSIS — I69015 Cognitive social or emotional deficit following nontraumatic subarachnoid hemorrhage: Secondary | ICD-10-CM

## 2016-09-23 DIAGNOSIS — R471 Dysarthria and anarthria: Secondary | ICD-10-CM

## 2016-09-23 DIAGNOSIS — R278 Other lack of coordination: Secondary | ICD-10-CM

## 2016-09-23 NOTE — Therapy (Addendum)
[facility] [address] [place], [place], [address] Phone: [phone]   Fax:  [phone]  Speech Language Pathology Treatment  Patient Details  Name: Abigail Campos MRN: [id number] Date of Birth: [birth year] Referring Provider: Delice Lesch, M.D.  Encounter Date: 09/23/2016      End of Session - 09/23/16 1617    Visit Number 8   Number of Visits 16   Date for SLP Re-Evaluation 10/23/16   SLP Start Time [id number]   SLP Stop Time  1400   SLP Time Calculation (min) 45 min   Activity Tolerance Patient tolerated treatment well      Past Medical History:  Diagnosis Date  . Essential hypertension 10/19/2007   03-2010: metoprolol changed to bystolic (was not feeling well on it, no specific allergy or reaction)    . Headache(784.0)    developed migraines after 2nd pregnancy, had a negative CT of the head  . Hypertension   . Stroke [facility])     Past Surgical History:  Procedure Laterality Date  . ABDOMINAL HYSTERECTOMY  11-14-07   no oophorectomy per surgical report  . CESAREAN SECTION     [id number]  . INGUINAL HERNIA REPAIR     2004    There were no vitals filed for this visit.      Subjective Assessment - 09/23/16 1610    Subjective I have to talk fast so they won't lose interest   Currently in Pain? No/denies               ADULT SLP TREATMENT - 09/23/16 0001      General Information   Behavior/Cognition Alert;Cooperative;Pleasant mood   Patient Positioning Upright in chair   Oral care provided N/A     Treatment Provided   Treatment provided Cognitive-Linguistic     Pain Assessment   Pain Assessment No/denies pain     Cognitive-Linquistic Treatment   Treatment focused on Cognition   Skilled Treatment Pt was seen for skilled ST intervention focusing on decreasing rate of speech, recall of information, and thought organization. Pt had difficulty presenting information regarding a chapter she  read, and was noted to speak very quickly with reduced intelligibility. When SLP mentioned this, pt became tearful, stating her family speaks for her and finishes her sentences, not allowing her the time to collect her thoughts and speak for herself. She indicated that her family always looks at their phones when she is trying to communicate, and that she feels like she must speak quickly so they do not lose interest. SLP and pt wrote a note to her family indicating current focus of ST, and requesting that family listen patiently, allow time for her to speak for herself. Pt indicated she felt much better after sharing this information.      Assessment / Recommendations / Plan   Plan Continue with current plan of care     Progression Toward Goals   Progression toward goals Progressing toward goals          SLP Education - 09/23/16 1616    Education provided Yes   Education Details Strategies to communicate effectively with family   Person(s) Educated Patient   Methods Explanation;Demonstration;Handout   Comprehension Verbalized understanding          SLP Short Term Goals - 09/23/16 1620      SLP SHORT TERM GOAL #1   Title pt will name 3 deficit areas over three sessions   Time 1   Period  Weeks   Status On-going     SLP SHORT TERM GOAL #2   Title pt will demo selective attention to therapy tasks for 10 minutes in min-mod noisy environment   Time 1   Period Weeks   Status On-going     SLP SHORT TERM GOAL #3   Title pt will complete tasks of simple cognitive linguistic organization with 90% success and rare min A   Status Achieved     SLP SHORT TERM GOAL #4   Title pt will attempt to self correct misarticulated speech in 50% of opportunities in structured speech tasks   Time 1   Period Weeks   Status On-going          SLP Long Term Goals - 09/23/16 1621      SLP LONG TERM GOAL #1   Title pt will demo alternating attention in simple cognitive linguistic tasks with  appropriate switch times, with compensations   Time 5   Period Weeks   Status On-going     SLP LONG TERM GOAL #2   Title pt will demo emergent awareness in cognitive linguistic tasks by self correcting errors in 80% of opportunities with nonverbal cues   Time 5   Period Weeks   Status On-going     SLP LONG TERM GOAL #3   Title pt will demo functional organization in mod complex linguistic tasks with modified independence   Time 5   Period Weeks   Status On-going     SLP LONG TERM GOAL #4   Title pt will demo appropriate rate of speech in 5 minutes simple to mod complex conversation with occasional nonverbal cues   Time 5   Period Weeks   Status On-going     SLP LONG TERM GOAL #5   Title pt will demo HEP for dysarthria with min A   Time 5   Period Weeks   Status On-going          Plan - 09/23/16 1618    Clinical Impression Statement Pt walked from waiting area to [place] office. Cooperative with therapy tasks, but became tearful when discussing how family makes her feel. Pt continues to benefit from continued skilled ST intervention with focus on higher level cognitive goals.   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 Left flaccid hemiplegia 06/24/2016  . Ear itching   . Poor fluid intake   . Bradycardia, drug induced   . Poor nutrition   . Muscle spasticity    . Obesity (BMI 30.0-34.9) 04/08/2016  . Hyperlipidemia 04/08/2016  . Basal ganglia hemorrhage ([place]) 04/08/2016  . Aphasia as late effect of cerebrovascular accident   . Dysphagia as late effect of cerebrovascular disease   . Migraine with aura and without status migrainosus, not intractable   . HLD (hyperlipidemia)   . Benign essential HTN   . Gait disturbance, post-stroke   . Hemiplegia, post-stroke ([place])   . Hypokalemia   . Dysphagia, post-stroke   .  Aphasia, post-stroke   . Bradycardia   . Headache, migraine   . Dysarthria, post-stroke   . Encephalopathy acute 04/02/2016  . ICH (intracerebral hemorrhage) (HCC) - R basal ganglia due to hypertensive emergency 04/01/2016  . Upper airway cough syndrome 12/06/2015  . PCP NOTES >>> 09/25/2015  . Need for hepatitis B vaccination 10/19/2014  . Insomnia 12/28/2012  . Annual physical exam 11/23/2011  . Hemorrhoids 10/13/2011  . Morbid obesity ([place]) 02/26/2011  . CHEST PAIN UNSPECIFIED 03/28/2010  . Essential hypertension 10/19/2007  . Headache(784.0) 05/12/2007   Celia B. [place]jo [place], MSP, CCC-SLP Shonna Chock 09/23/2016, 4:23 PM  [facility] [address] [place], [place], [address] Phone: [phone]   Fax:  [phone]   Name: NAOMIE BOILEAU MRN: [id number] Date of Birth: June 30, 1971

## 2016-09-23 NOTE — Telephone Encounter (Signed)
We can restart on her Prozaac 20mg , she can call our office.  Thanks.

## 2016-09-23 NOTE — Telephone Encounter (Signed)
Dr. Posey Pronto,  Abigail Campos is receiving occupational therapy at our site. She reports that she is very emotional and feeling depressed  since her Prozac was discontinued. Today pt.was extremely tearful throughout her OT appointment with no precipitating event. Pt would like to know if she could be restarted on this medication or if there is another medication that could be helpful. Sincerely, Theone Murdoch, OTR/L

## 2016-09-24 ENCOUNTER — Encounter: Payer: Managed Care, Other (non HMO) | Admitting: Occupational Therapy

## 2016-09-24 ENCOUNTER — Ambulatory Visit: Payer: Managed Care, Other (non HMO)

## 2016-09-25 ENCOUNTER — Ambulatory Visit: Payer: Managed Care, Other (non HMO)

## 2016-09-25 DIAGNOSIS — R2689 Other abnormalities of gait and mobility: Secondary | ICD-10-CM

## 2016-09-25 DIAGNOSIS — I69352 Hemiplegia and hemiparesis following cerebral infarction affecting left dominant side: Secondary | ICD-10-CM | POA: Diagnosis not present

## 2016-09-25 DIAGNOSIS — R471 Dysarthria and anarthria: Secondary | ICD-10-CM

## 2016-09-25 DIAGNOSIS — I69015 Cognitive social or emotional deficit following nontraumatic subarachnoid hemorrhage: Secondary | ICD-10-CM

## 2016-09-25 NOTE — Therapy (Signed)
[facility] [address] [place], [place], [address] Phone: [phone]   Fax:  [phone]  Physical Therapy Treatment  Patient Details  Name: Abigail Campos MRN: [id number] Date of Birth: [birth year] Referring Provider: Dr. Posey Pronto  Encounter Date: 09/25/2016      PT End of Session - 09/25/16 1501    Visit Number 11   Number of Visits 17   Date for PT Re-Evaluation 10/10/16   Authorization Type [employer]-240 visit limit (including HH and OP). Per Lattie Haw, pt used 24 total HHPT, [place], and [place] visits.   PT Start Time 1403  pt used bathroom for 4 minutes   PT Stop Time 1448   PT Time Calculation (min) 45 min   Equipment Utilized During Treatment Gait belt   Activity Tolerance Treatment limited secondary to medical complications (Comment)  lightheadedness      Past Medical History:  Diagnosis Date  . Essential hypertension 10/19/2007   03-2010: metoprolol changed to bystolic (was not feeling well on it, no specific allergy or reaction)    . Headache(784.0)    developed migraines after 2nd pregnancy, had a negative CT of the head  . Hypertension   . Stroke [facility])     Past Surgical History:  Procedure Laterality Date  . ABDOMINAL HYSTERECTOMY  11-14-07   no oophorectomy per surgical report  . CESAREAN SECTION     [id number]  . INGUINAL HERNIA REPAIR     2004    There were no vitals filed for this visit.      Subjective Assessment - 09/25/16 1410    Subjective Pt denied falls since last visit. Pt reports she feels dizzy today.    Pertinent History HTN, hyperlipidemia, hx of migraine   Patient Stated Goals I want to walk and stand up without falling.    Currently in Pain? No/denies                Vestibular Assessment - 09/25/16 1412      Vestibular Assessment   General Observation Pt reported dizziness began at 10am this morning after using bathroom, she sat in w/c, then went to bed to lie  down until it was time for therapy. Pt reports she had dizziness when she first came home in May but was gone by July. She had some dizziness in September but today is bad.      Symptom Behavior   Type of Dizziness Lightheadedness   Frequency of Dizziness Began today.   Duration of Dizziness Constant since onset at 10am.    Aggravating Factors Supine to sit;Sit to stand   Relieving Factors Rest;Lying supine     Occulomotor Exam   Occulomotor Alignment Normal   Spontaneous Absent   Gaze-induced Absent   Smooth Pursuits Comment  difficulty tracking 2/2 dizziness   Saccades Intact   Comment Slight dizziness but different than what she's been feeling     Vestibulo-Occular Reflex   VOR 1 Head Only (x 1 viewing) Decr. speed and incr. dizziness 6/10. Difficulty maintaining focus on target.     Orthostatics   BP supine (x 5 minutes) 133/82   HR supine (x 5 minutes) 47   BP sitting 143/95  pt reported 5/10 dizziness (lightheadedness)   HR sitting 50   BP standing (after 1 minute) 136/89  5/10 dizziness/lightheadedness.   HR standing (after 1 minute) 54       Neuro re-ed: In // bars with min guard to min A for safety:  Pt performed heel taps with L heel to dots (ant/lat) x5 reps to improve heel strike and proprioception with cues for upright posture and foot placement.  Backwards amb. 4', 7'x2 with cues for technique and improved step length and L stance time. Ant. Wt. Shifting in // bars to improve LLE stance time and wt. Shifting. All performed with one UE support.                  PT Education - 09/25/16 1453    Education provided Yes   Education Details PT educated pt on the importance of getting enough sleep and to notify MD if sleeping issues persist. PT also educated pt on flucutating BP during position changes and low HR, and to notify MD if lightheadedness persists and to go to ED if she experiences severe HA, weakness, N/T, or confusion. PT informed pt that PT  would send note to MD, pt agreeable.    Person(s) Educated Patient   Methods Explanation   Comprehension Verbalized understanding          PT Short Term Goals - 09/17/16 1641      PT SHORT TERM GOAL #1   Title Pt will be IND in performing HEP to improve strength and balance. TARGET DATE FOR ALL STGS: 09/08/16   Status Partially Met     PT SHORT TERM GOAL #2   Title Perform BERG and write goals if appropriate.   Status Achieved     PT SHORT TERM GOAL #3   Title Pt will amb. 200' over even terrain with LRAD at MOD I level to improve functional mobility.    Status Partially Met     PT SHORT TERM GOAL #4   Title Pt will improve gait speed to 0.5f/sec with LRAD to decrease falls risk.    Status Not Met     PT SHORT TERM GOAL #5   Title Pt will improve BERG score to >/=21/56 to decr. falls risk.    Status Achieved           PT Long Term Goals - 09/09/16 1501      PT LONG TERM GOAL #1   Title Pt will verbalize understanding or CVA risk factors and signs/symptoms to reduce risk of additional CVA. TARGET DATE FOR ALL LTGS: 10/06/16   Status On-going     PT LONG TERM GOAL #2   Title Pt will improve gait speed to >/=1.830fsec. with LRAD to reduce falls risk.    Status On-going     PT LONG TERM GOAL #3   Title Pt will amb. 400' over even/paved surfaces with LRAD at MOD I level to improve functional mobility.    Status On-going     PT LONG TERM GOAL #4   Title Pt will perform TUG with LRAD in </=30sec. to improve function and decr. falls risk.    Status On-going     PT LONG TERM GOAL #5   Title Pt will perform stand pivot txfs from chair to w/c at MOD I level to improve functional mobility.    Status On-going     PT LONG TERM GOAL #6   Title Pt will improve BERG score to >/=31/56 to decr. falls risk.    Baseline 27/56 on 09/07/16, therefore, PT revised long term BERG goal from 25/56 to [place] 09/25/16 1502    Clinical  Impression  Statement Pt session limited by pt c/o dizziness/lightheadedness, especially during sit to stand txfs. Pt's BP fluctuated during orthostatic testing (incr/decr) and low HR, and was not positive for significant decr., pt reported she took medication as prescribed today. Pt had difficulty performing occulomotor exam and VOR 2/2 dizziness, and may benefit from gaze stabilization activities. PT will send note to MD regarding pt's BP and HR during txfs.    Rehab Potential Good   Clinical Impairments Affecting Rehab Potential co-morbidities   PT Frequency 2x / week   PT Duration 8 weeks   PT Treatment/Interventions ADLs/Self Care Home Management;Biofeedback;Canalith Repostioning;Electrical Stimulation;Cognitive remediation;Neuromuscular re-education;Balance training;Therapeutic exercise;Therapeutic activities;Functional mobility training;Stair training;Gait training;DME Instruction;Orthotic Fit/Training;Patient/family education;Vestibular;Manual techniques   PT Next Visit Plan Continue gait training with hemi-walker and [facility] and new AFO    PT Home Exercise Plan Strengthening HEP.    Consulted and Agree with Plan of Care Patient;Family member/caregiver   Family Member Consulted pt's mom: [name]      Patient will benefit from skilled therapeutic intervention in order to improve the following deficits and impairments:  Abnormal gait, Decreased endurance, Decreased knowledge of use of DME, Decreased strength, Impaired UE functional use, Impaired sensation, Decreased balance, Decreased mobility, Decreased range of motion, Decreased coordination, Impaired flexibility, Decreased cognition  Visit Diagnosis: Hemiplegia and hemiparesis following cerebral infarction affecting left dominant side (HCC)  Other abnormalities of gait and mobility     Problem List Patient Active Problem List   Diagnosis Date Noted  . Left flaccid hemiplegia 06/24/2016  . Ear itching   . Poor fluid intake   .  Bradycardia, drug induced   . Poor nutrition   . Muscle spasticity   . Obesity (BMI 30.0-34.9) 04/08/2016  . Hyperlipidemia 04/08/2016  . Basal ganglia hemorrhage ([place]) 04/08/2016  . Aphasia as late effect of cerebrovascular accident   . Dysphagia as late effect of cerebrovascular disease   . Migraine with aura and without status migrainosus, not intractable   . HLD (hyperlipidemia)   . Benign essential HTN   . Gait disturbance, post-stroke   . Hemiplegia, post-stroke ([place])   . Hypokalemia   . Dysphagia, post-stroke   . Aphasia, post-stroke   . Bradycardia   . Headache, migraine   . Dysarthria, post-stroke   . Encephalopathy acute 04/02/2016  . ICH (intracerebral hemorrhage) (HCC) - R basal ganglia due to hypertensive emergency 04/01/2016  . Upper airway cough syndrome 12/06/2015  . PCP NOTES >>> 09/25/2015  . Need for hepatitis B vaccination 10/19/2014  . Insomnia 12/28/2012  . Annual physical exam 11/23/2011  . Hemorrhoids 10/13/2011  . Morbid obesity ([place]) 02/26/2011  . CHEST PAIN UNSPECIFIED 03/28/2010  . Essential hypertension 10/19/2007  . Headache(784.0) 05/12/2007    Miller,Jennifer L 09/25/2016, 3:06 PM  [facility] [address] [place], [place], [address] Phone: [phone]   Fax:  [phone]  Name: Abigail Campos MRN: [id number] Date of Birth: 1971-03-21  Geoffry Paradise, PT,DPT 09/25/16 3:09 PM Phone: [phone] Fax: [phone]

## 2016-09-25 NOTE — Therapy (Signed)
[facility] [address] [place], [place], [address] Phone: [phone]   Fax:  [phone]  Occupational Therapy Treatment  Patient Details  Name: Abigail Campos MRN: [id number] Date of Birth: [birth year] Referring Provider: Dr. Posey Pronto  Encounter Date: 09/23/2016    Past Medical History:  Diagnosis Date  . Essential hypertension 10/19/2007   03-2010: metoprolol changed to bystolic (was not feeling well on it, no specific allergy or reaction)    . Headache(784.0)    developed migraines after 2nd pregnancy, had a negative CT of the head  . Hypertension   . Stroke [facility])     Past Surgical History:  Procedure Laterality Date  . ABDOMINAL HYSTERECTOMY  11-14-07   no oophorectomy per surgical report  . CESAREAN SECTION     [id number]  . INGUINAL HERNIA REPAIR     2004    There were no vitals filed for this visit.      Subjective Assessment - 09/25/16 1221    Subjective  Pt tearful, reporting she is having an emotional day   Pertinent History see Epic   Patient Stated Goals to be able to use left arm   Currently in Pain? No/denies          Pt was very tearful on arrival to therapy. Therapsit provided reasssurance. Pt reports MD stopped her prozac and she has been emotional and feeling depressed ever since. Note sent to MD on pt's behalf to see if she could restart this medication or if there was another medication that was appropriate. (Therapist later received a message after tx was ended, from MD that pt should call his office and she could restart her prozac) NMES 50 pps, 250pw, 10 secs cycle intensity 14 to wrist and finger extensors with therapist or pt assisting with finger extension. Standing at tabletop for gentle weight bearing through LUE, mod facilitation.   Therapist later left a message for pt on home phone requesting she call her and a message on cell that pt should call MD if she wants to  restart medicine.                     OT Short Term Goals - 09/22/16 1443      OT SHORT TERM GOAL #1   Title Pt/ caregiver will be I with HEP.   Time 6   Period Weeks   Status New     OT SHORT TERM GOAL #2   Title Pt/ family will verbalize understanding of LUE positioning to minimize pain and risk for injury, including splinting PRN.   Time 6   Period Weeks   Status On-going     OT SHORT TERM GOAL #3   Title Pt will donn shirt with min A.   Time 6   Period Weeks   Status New     OT SHORT TERM GOAL #4   Title Pt will demonstrate 90* P/ROM shoulder flexion in LUE with pain less than or equal to 3/10.   Time 6   Period Weeks     OT SHORT TERM GOAL #5   Title Pt will perform simple snack prep modified independently.   Time 6   Period Weeks   Status New     OT SHORT TERM GOAL #6   Title Pt will perform bathing at a supervision level   Time 6   Period Weeks   Status New  OT Long Term Goals - 09/22/16 1354      OT LONG TERM GOAL #1   Title Pt will perform LB dressing with min A.   Time 12   Period Weeks   Status New     OT LONG TERM GOAL #2   Title Pt will use LUE as a stabilizer/ gross A with min A.   Time 12   Period Weeks   Status New     OT LONG TERM GOAL #3   Title Pt will perform simple home management tasks with min A.   Time 6   Period Weeks   Status New     OT LONG TERM GOAL #4   Title Pt will demonstrate adequate standing balance to retrieve a light weight object from overhead cabinet modified independently with RUE without LOB in prep for IADLS.   Time 12   Period Weeks   Status New     OT LONG TERM GOAL #5   Title Pt will perform shower transfers modified independently   Time 12   Period Weeks   Status New               Plan - 09/25/16 1230    Clinical Impression Statement Pt is progressing towards goals. Today pt was very tearful and reports felling of depression since her Prozac was discontinued.    Rehab Potential Good   Clinical Impairments Affecting Rehab Potential severity of deficits   OT Frequency 2x / week   OT Duration 12 weeks   OT Treatment/Interventions Self-care/ADL training;Moist Heat;Fluidtherapy;DME and/or AE instruction;Splinting;Patient/family education;Balance Financial planner;Contrast Bath;Ultrasound;Therapeutic exercise;Therapeutic activities;Cognitive remediation/compensation;Passive range of motion;Functional Mobility Training;Neuromuscular education;Cryotherapy;Electrical Stimulation;Parrafin;Energy conservation;Manual Therapy;Visual/perceptual remediation/compensation   Plan make sure pt got message to call Dr. if she wants to restart medication, UB dsg, neuro re-ed, schedule more appts.   OT Home Exercise Plan 09/07/16:  resting splint wear/care   Consulted and Agree with Plan of Care Patient;Family member/caregiver   Family Member Consulted husband      Patient will benefit from skilled therapeutic intervention in order to improve the following deficits and impairments:  Abnormal gait, Decreased coordination, Decreased range of motion, Difficulty walking, Impaired flexibility, Impaired sensation, Increased edema, Decreased safety awareness, Decreased endurance, Decreased activity tolerance, Decreased knowledge of precautions, Increased fascial restricitons, Impaired tone, Pain, Impaired UE functional use, Decreased knowledge of use of DME, Decreased balance, Decreased cognition, Decreased mobility, Decreased strength, Impaired perceived functional ability, Impaired vision/preception  Visit Diagnosis: Hemiplegia and hemiparesis following cerebral infarction affecting left dominant side (HCC)  Acute pain of left shoulder  Other lack of coordination    Problem List Patient Active Problem List   Diagnosis Date Noted  . Left flaccid hemiplegia 06/24/2016  . Ear itching   . Poor fluid intake   . Bradycardia, drug induced   . Poor nutrition   . Muscle  spasticity   . Obesity (BMI 30.0-34.9) 04/08/2016  . Hyperlipidemia 04/08/2016  . Basal ganglia hemorrhage ([place]) 04/08/2016  . Aphasia as late effect of cerebrovascular accident   . Dysphagia as late effect of cerebrovascular disease   . Migraine with aura and without status migrainosus, not intractable   . HLD (hyperlipidemia)   . Benign essential HTN   . Gait disturbance, post-stroke   . Hemiplegia, post-stroke ([place])   . Hypokalemia   . Dysphagia, post-stroke   . Aphasia, post-stroke   . Bradycardia   . Headache, migraine   . Dysarthria, post-stroke   . Encephalopathy acute 04/02/2016  .  ICH (intracerebral hemorrhage) (HCC) - R basal ganglia due to hypertensive emergency 04/01/2016  . Upper airway cough syndrome 12/06/2015  . PCP NOTES >>> 09/25/2015  . Need for hepatitis B vaccination 10/19/2014  . Insomnia 12/28/2012  . Annual physical exam 11/23/2011  . Hemorrhoids 10/13/2011  . Morbid obesity ([place]) 02/26/2011  . CHEST PAIN UNSPECIFIED 03/28/2010  . Essential hypertension 10/19/2007  . Headache(784.0) 05/12/2007    Leomar Westberg 09/25/2016, 12:33 PM Theone Murdoch, OTR/L [phone] Phone: [phone] 12:33 PM 09/25/16 [facility] [address] [place], [place], [address] Phone: [phone]   Fax:  [phone]  Name: NANCEE KARAHALIOS MRN: [id number] Date of Birth: [birth year]

## 2016-09-25 NOTE — Therapy (Addendum)
[facility] [address] [place], [place], [address] Phone: [phone]   Fax:  [phone]  Speech Language Pathology Treatment  Patient Details  Name: Abigail Campos MRN: [id number] Date of Birth: 03-11-1971 Referring Provider: Delice Lesch, M.D.  Encounter Date: 09/25/2016      End of Session - 09/25/16 1542    Visit Number 9   Number of Visits 16   Date for SLP Re-Evaluation 10/23/16   SLP Start Time [id number]   SLP Stop Time  1400   SLP Time Calculation (min) 43 min   Activity Tolerance Patient tolerated treatment well      Past Medical History:  Diagnosis Date  . Essential hypertension 10/19/2007   03-2010: metoprolol changed to bystolic (was not feeling well on it, no specific allergy or reaction)    . Headache(784.0)    developed migraines after 2nd pregnancy, had a negative CT of the head  . Hypertension   . Stroke [facility])     Past Surgical History:  Procedure Laterality Date  . ABDOMINAL HYSTERECTOMY  11-14-07   no oophorectomy per surgical report  . CESAREAN SECTION     [id number]  . INGUINAL HERNIA REPAIR     2004    There were no vitals filed for this visit.      Subjective Assessment - 09/25/16 1324    Subjective Pt reports the letter she wrote last visit went over well with family.    Currently in Pain? No/denies               ADULT SLP TREATMENT - 09/25/16 1324      General Information   Behavior/Cognition Alert;Cooperative;Pleasant mood   Patient Positioning Upright in chair     Treatment Provided   Treatment provided Cognitive-Linquistic     Cognitive-Linquistic Treatment   Treatment focused on Cognition   Skilled Treatment Pt reports getting 4 hours of sleep last night. With SLP min A occasionally, pt named 2 deficits re: cognition (attention and memory). To facilitate pt's organization, selective attention she sequenced 8 step sequence with extra time consistenly, and  mod cues from SLP. Pt demo'd intermittent emergent awareness, correcting her final sequence once of 2 possible instances requiring correction.      Assessment / Recommendations / Plan   Plan Continue with current plan of care     Progression Toward Goals   Progression toward goals Progressing toward goals            SLP Short Term Goals - 09/25/16 1325      SLP SHORT TERM GOAL #1   Title pt will name 3 deficit areas over three sessions   Time 1   Period Weeks   Status On-going     SLP SHORT TERM GOAL #2   Title pt will demo selective attention to therapy tasks for 10 minutes in min-mod noisy environment   Time 1   Period Weeks   Status On-going     SLP SHORT TERM GOAL #3   Title pt will complete tasks of simple cognitive linguistic organization with 90% success and rare min A   Status Achieved     SLP SHORT TERM GOAL #4   Title pt will attempt to self correct misarticulated speech in 50% of opportunities in structured speech tasks   Time 1   Period Weeks   Status On-going          SLP Long Term Goals - 09/25/16 1546  SLP LONG TERM GOAL #1   Title pt will demo alternating attention in simple cognitive linguistic tasks with appropriate switch times, with compensations   Time 5   Period Weeks   Status On-going     SLP LONG TERM GOAL #2   Title pt will demo emergent awareness in cognitive linguistic tasks by self correcting errors in 80% of opportunities with nonverbal cues   Time 5   Period Weeks   Status On-going     SLP LONG TERM GOAL #3   Title pt will demo functional organization in mod complex linguistic tasks with modified independence   Time 5   Period Weeks   Status On-going     SLP LONG TERM GOAL #4   Title pt will demo appropriate rate of speech in 5 minutes simple to mod complex conversation with occasional nonverbal cues   Time 5   Period Weeks   Status On-going     SLP LONG TERM GOAL #5   Title pt will demo HEP for dysarthria with min  A   Time 5   Period Weeks   Status On-going          Plan - 09/25/16 1542    Clinical Impression Statement . Cooperative with therapy tasks, but again became tearful when SLP turned on radio for distraction. Pt continues to benefit from continued skilled ST intervention with focus on higher level cognitive goals.   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 Left flaccid hemiplegia 06/24/2016  . Ear itching   . Poor fluid intake   . Bradycardia, drug induced   . Poor nutrition   . Muscle spasticity   . Obesity (BMI 30.0-34.9) 04/08/2016  . Hyperlipidemia 04/08/2016  . Basal ganglia hemorrhage ([place]) 04/08/2016  . Aphasia as late effect of cerebrovascular accident   . Dysphagia as late effect of cerebrovascular disease   . Migraine with aura and without status migrainosus, not intractable   . HLD (hyperlipidemia)   . Benign essential HTN   . Gait disturbance, post-stroke   . Hemiplegia, post-stroke ([place])   . Hypokalemia   . Dysphagia, post-stroke   . Aphasia, post-stroke   . Bradycardia   . Headache, migraine   . Dysarthria, post-stroke   . Encephalopathy acute 04/02/2016  . ICH (intracerebral hemorrhage) (HCC) - R basal ganglia due to hypertensive emergency 04/01/2016  . Upper airway cough syndrome 12/06/2015  . PCP NOTES >>> 09/25/2015  . Need for hepatitis B  vaccination 10/19/2014  . Insomnia 12/28/2012  . Annual physical exam 11/23/2011  . Hemorrhoids 10/13/2011  . Morbid obesity ([place]) 02/26/2011  . CHEST PAIN UNSPECIFIED 03/28/2010  . Essential hypertension 10/19/2007  . Headache(784.0) 05/12/2007    [place] [facility], [place]  09/25/2016, 4:30 PM  [facility] [address] [place], [place], [address] Phone: [phone]   Fax:  [phone]   Name: Abigail Campos MRN: [id number] Date of Birth: 1971/05/18

## 2016-09-25 NOTE — Patient Instructions (Signed)
  Please complete the assigned speech therapy homework and return it to your next session.  

## 2016-09-29 ENCOUNTER — Ambulatory Visit: Payer: Managed Care, Other (non HMO)

## 2016-09-29 ENCOUNTER — Ambulatory Visit: Payer: Managed Care, Other (non HMO) | Admitting: Occupational Therapy

## 2016-09-29 VITALS — BP 123/78 | HR 70

## 2016-09-29 DIAGNOSIS — M25512 Pain in left shoulder: Secondary | ICD-10-CM

## 2016-09-29 DIAGNOSIS — I69352 Hemiplegia and hemiparesis following cerebral infarction affecting left dominant side: Secondary | ICD-10-CM

## 2016-09-29 DIAGNOSIS — R2689 Other abnormalities of gait and mobility: Secondary | ICD-10-CM

## 2016-09-29 DIAGNOSIS — R278 Other lack of coordination: Secondary | ICD-10-CM

## 2016-09-29 DIAGNOSIS — R471 Dysarthria and anarthria: Secondary | ICD-10-CM

## 2016-09-29 DIAGNOSIS — I69015 Cognitive social or emotional deficit following nontraumatic subarachnoid hemorrhage: Secondary | ICD-10-CM

## 2016-09-29 NOTE — Therapy (Addendum)
[facility] [address] [place], [place], [address] Phone: [phone]   Fax:  [phone]  Speech Language Pathology Treatment  Patient Details  Name: Abigail Campos MRN: [id number] Date of Birth: Apr 27, 1971 Referring Provider: Delice Lesch, M.D.  Encounter Date: 09/29/2016      End of Session - 09/29/16 1626    Visit Number 10   Number of Visits 16   Date for SLP Re-Evaluation 10/23/16   Authorization - Number of Visits 240   SLP Start Time 6962   SLP Stop Time  9528   SLP Time Calculation (min) 43 min   Activity Tolerance Patient tolerated treatment well      Past Medical History:  Diagnosis Date  . Essential hypertension 10/19/2007   03-2010: metoprolol changed to bystolic (was not feeling well on it, no specific allergy or reaction)    . Headache(784.0)    developed migraines after 2nd pregnancy, had a negative CT of the head  . Hypertension   . Stroke [facility])     Past Surgical History:  Procedure Laterality Date  . ABDOMINAL HYSTERECTOMY  11-14-07   no oophorectomy per surgical report  . CESAREAN SECTION     [id number]  . INGUINAL HERNIA REPAIR     2004    There were no vitals filed for this visit.      Subjective Assessment - 09/29/16 1623    Subjective Pt reports what she did with OT.               ADULT SLP TREATMENT - 09/29/16 1624      General Information   Behavior/Cognition Alert;Cooperative;Pleasant mood     Treatment Provided   Treatment provided Cognitive-Linquistic     Pain Assessment   Pain Assessment No/denies pain     Cognitive-Linquistic Treatment   Treatment focused on Cognition   Skilled Treatment In detailed reasoning tasks pt demonstrated the need for mod SLP verbal A for reasoning and min verbal cues for attention for details. Pt told SLP one deficit ("processing stuff") without cues, but agreed when SLP told her about attention and reasoning too.      Assessment / Recommendations / Plan   Plan Continue with current plan of care     Progression Toward Goals   Progression toward goals Progressing toward goals            SLP Short Term Goals - 09/29/16 1628      SLP SHORT TERM GOAL #1   Title pt will name 3 deficit areas over three sessions   Time 1   Period Weeks   Status Not Met     SLP SHORT TERM GOAL #2   Title pt will demo selective attention to therapy tasks for 10 minutes in min-mod noisy environment   Status Achieved     SLP SHORT TERM GOAL #3   Title pt will complete tasks of simple cognitive linguistic organization with 90% success and rare min A   Status Achieved     SLP SHORT TERM GOAL #4   Title pt will attempt to self correct misarticulated speech in 50% of opportunities in structured speech tasks   Status Deferred  due to focus on cognitive goals          SLP Long Term Goals - 09/25/16 1546      SLP LONG TERM GOAL #1   Title pt will demo alternating attention in simple cognitive linguistic tasks with appropriate switch times, with  compensations   Time 5   Period Weeks   Status On-going     SLP LONG TERM GOAL #2   Title pt will demo emergent awareness in cognitive linguistic tasks by self correcting errors in 80% of opportunities with nonverbal cues   Time 5   Period Weeks   Status On-going     SLP LONG TERM GOAL #3   Title pt will demo functional organization in mod complex linguistic tasks with modified independence   Time 5   Period Weeks   Status On-going     SLP LONG TERM GOAL #4   Title pt will demo appropriate rate of speech in 5 minutes simple to mod complex conversation with occasional nonverbal cues   Time 5   Period Weeks   Status On-going     SLP LONG TERM GOAL #5   Title pt will demo HEP for dysarthria with min A   Time 5   Period Weeks   Status On-going          Plan - 09/29/16 1627    Clinical Impression Statement Pt continues to benefit from continued skilled ST  intervention with focus on higher level cognitive goals including attention and reasoning.   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 Left flaccid hemiplegia 06/24/2016  . Ear itching   . Poor fluid intake   . Bradycardia, drug induced   . Poor nutrition   . Muscle spasticity   . Obesity (BMI 30.0-34.9) 04/08/2016  . Hyperlipidemia 04/08/2016  . Basal ganglia hemorrhage ([place]) 04/08/2016  . Aphasia as late effect of cerebrovascular accident   . Dysphagia as late effect of cerebrovascular disease   . Migraine with aura and without status migrainosus, not intractable   . HLD (hyperlipidemia)   . Benign essential HTN   . Gait disturbance, post-stroke   . Hemiplegia, post-stroke ([place])   . Hypokalemia   . Dysphagia, post-stroke   . Aphasia, post-stroke   . Bradycardia   . Headache, migraine   . Dysarthria, post-stroke   . Encephalopathy acute 04/02/2016  . ICH (intracerebral hemorrhage) (HCC) - R basal ganglia due to hypertensive emergency 04/01/2016  . Upper airway cough syndrome 12/06/2015  . PCP NOTES >>> 09/25/2015  . Need for hepatitis B vaccination 10/19/2014  . Insomnia 12/28/2012  . Annual physical exam 11/23/2011  . Hemorrhoids 10/13/2011  . Morbid obesity ([place]) 02/26/2011  . CHEST PAIN UNSPECIFIED 03/28/2010  . Essential hypertension 10/19/2007  .  Headache(784.0) 05/12/2007    [facility] ,[place], [place]  09/29/2016, 4:31 PM  [facility] [address] [place], [place], [address] Phone: [phone]   Fax:  [phone]   Name: YADHIRA MCKNEELY MRN: [id number] Date of Birth: 1971-03-19

## 2016-09-29 NOTE — Therapy (Signed)
[facility] [address] [place], [place], [address] Phone: [phone]   Fax:  [phone]  Occupational Therapy Treatment  Patient Details  Name: Abigail Campos MRN: [id number] Date of Birth: 12-12-1971 Referring Provider: Dr. Posey Pronto  Encounter Date: 09/29/2016      OT End of Session - 09/29/16 1405    Visit Number 8   Number of Visits 25   Date for OT Re-Evaluation 12/09/16   Authorization Type [employer] (240 visit limit combined with home health, estimated HH used 24 combined), no auth required    Authorization Time Period wk 5/12   OT Start Time 1406   OT Stop Time 1445   OT Time Calculation (min) 39 min   Activity Tolerance Patient tolerated treatment well   Behavior During Therapy [facility] for tasks assessed/performed      Past Medical History:  Diagnosis Date  . Essential hypertension 10/19/2007   03-2010: metoprolol changed to bystolic (was not feeling well on it, no specific allergy or reaction)    . Headache(784.0)    developed migraines after 2nd pregnancy, had a negative CT of the head  . Hypertension   . Stroke [facility])     Past Surgical History:  Procedure Laterality Date  . ABDOMINAL HYSTERECTOMY  11-14-07   no oophorectomy per surgical report  . CESAREAN SECTION     [id number]  . INGUINAL HERNIA REPAIR     2004    There were no vitals filed for this visit.      Subjective Assessment - 09/29/16 1405    Subjective  Pt reports that she contacted MD but hasn't heard a response yet.  Will see PCP tomorrow.   Pertinent History see Epic   Patient Stated Goals to be able to use left arm   Currently in Pain? No/denies       Neuro Re-ed:  Wt. Shifts to the L side with LUE supported for light wt. Bearing and in abduction in prep for functional reach with mod facilitation.  Wt. Bearing through L elbow with attempts for push to sitting with L arm with minimal activation of LUE felt.  Light wt.  Bearing through L hand in abduction with lateral wt. Shifts and body on arm movements with min facilitation and min-mod cues.  Then light wt. Bearing in forward flex with BUEs progressing to LUE only with mod facilitation/cues  Body on arm movements (anterior/posterior pelvic tilts and lateral wt. Shifts) with mod cueing, min facilitation.  AAROM shoulder flex/elbow ext and horizontal abduction/ER with mod cues for compensation and min-mod facilitation.   Cueing throughout session for postural alignment and avoiding trunk compensation with attempts to activate LUE.                   OT Education - 09/29/16 1512    Education Details Importance of wt. shifting to the L side for incr awareness and improved alignment for balance and LUE movement   Person(s) Educated Patient   Methods Explanation;Demonstration;Verbal cues   Comprehension Verbalized understanding;Returned demonstration          OT Short Term Goals - 09/22/16 1443      OT SHORT TERM GOAL #1   Title Pt/ caregiver will be I with HEP.   Time 6   Period Weeks   Status New     OT SHORT TERM GOAL #2   Title Pt/ family will verbalize understanding of LUE positioning to minimize pain and risk for injury, including  splinting PRN.   Time 6   Period Weeks   Status On-going     OT SHORT TERM GOAL #3   Title Pt will donn shirt with min A.   Time 6   Period Weeks   Status New     OT SHORT TERM GOAL #4   Title Pt will demonstrate 90* P/ROM shoulder flexion in LUE with pain less than or equal to 3/10.   Time 6   Period Weeks     OT SHORT TERM GOAL #5   Title Pt will perform simple snack prep modified independently.   Time 6   Period Weeks   Status New     OT SHORT TERM GOAL #6   Title Pt will perform bathing at a supervision level   Time 6   Period Weeks   Status New           OT Long Term Goals - 09/22/16 1354      OT LONG TERM GOAL #1   Title Pt will perform LB dressing with min A.   Time 12    Period Weeks   Status New     OT LONG TERM GOAL #2   Title Pt will use LUE as a stabilizer/ gross A with min A.   Time 12   Period Weeks   Status New     OT LONG TERM GOAL #3   Title Pt will perform simple home management tasks with min A.   Time 6   Period Weeks   Status New     OT LONG TERM GOAL #4   Title Pt will demonstrate adequate standing balance to retrieve a light weight object from overhead cabinet modified independently with RUE without LOB in prep for IADLS.   Time 12   Period Weeks   Status New     OT LONG TERM GOAL #5   Title Pt will perform shower transfers modified independently   Time 12   Period Weeks   Status New               Plan - 09/29/16 1406    Clinical Impression Statement Pt is progressing towards goals with incr comfort with midline alignment and wt. shifting to the L side.  Pt initiates some AAROM L shoulder/elbow.   Rehab Potential Good   Clinical Impairments Affecting Rehab Potential severity of deficits   OT Frequency 2x / week   OT Duration 12 weeks   OT Treatment/Interventions Self-care/ADL training;Moist Heat;Fluidtherapy;DME and/or AE instruction;Splinting;Patient/family education;Balance Financial planner;Contrast Bath;Ultrasound;Therapeutic exercise;Therapeutic activities;Cognitive remediation/compensation;Passive range of motion;Functional Mobility Training;Neuromuscular education;Cryotherapy;Electrical Stimulation;Parrafin;Energy conservation;Manual Therapy;Visual/perceptual remediation/compensation   Plan UB dressing, neuro re-ed   OT Home Exercise Plan 09/07/16:  resting splint wear/care   Consulted and Agree with Plan of Care Patient;Family member/caregiver   Family Member Consulted husband      Patient will benefit from skilled therapeutic intervention in order to improve the following deficits and impairments:  Abnormal gait, Decreased coordination, Decreased range of motion, Difficulty walking, Impaired  flexibility, Impaired sensation, Increased edema, Decreased safety awareness, Decreased endurance, Decreased activity tolerance, Decreased knowledge of precautions, Increased fascial restricitons, Impaired tone, Pain, Impaired UE functional use, Decreased knowledge of use of DME, Decreased balance, Decreased cognition, Decreased mobility, Decreased strength, Impaired perceived functional ability, Impaired vision/preception  Visit Diagnosis: Hemiplegia and hemiparesis following cerebral infarction affecting left dominant side (HCC)  Acute pain of left shoulder  Other lack of coordination    Problem List Patient Active Problem  List   Diagnosis Date Noted  . Left flaccid hemiplegia 06/24/2016  . Ear itching   . Poor fluid intake   . Bradycardia, drug induced   . Poor nutrition   . Muscle spasticity   . Obesity (BMI 30.0-34.9) 04/08/2016  . Hyperlipidemia 04/08/2016  . Basal ganglia hemorrhage ([place]) 04/08/2016  . Aphasia as late effect of cerebrovascular accident   . Dysphagia as late effect of cerebrovascular disease   . Migraine with aura and without status migrainosus, not intractable   . HLD (hyperlipidemia)   . Benign essential HTN   . Gait disturbance, post-stroke   . Hemiplegia, post-stroke ([place])   . Hypokalemia   . Dysphagia, post-stroke   . Aphasia, post-stroke   . Bradycardia   . Headache, migraine   . Dysarthria, post-stroke   . Encephalopathy acute 04/02/2016  . ICH (intracerebral hemorrhage) (HCC) - R basal ganglia due to hypertensive emergency 04/01/2016  . Upper airway cough syndrome 12/06/2015  . PCP NOTES >>> 09/25/2015  . Need for hepatitis B vaccination 10/19/2014  . Insomnia 12/28/2012  . Annual physical exam 11/23/2011  . Hemorrhoids 10/13/2011  . Morbid obesity ([place]) 02/26/2011  . CHEST PAIN UNSPECIFIED 03/28/2010  . Essential hypertension 10/19/2007  . Headache(784.0) 05/12/2007    [facility] 09/29/2016, 8:02 PM  [facility] [address] [place], [place], [address] Phone: [phone]   Fax:  [phone]  Name: Abigail Campos MRN: [id number] Date of Birth: 05-07-1971   Vianne Bulls, OTR/L [facility] [address] [place], [place]  [address] [phone] phone [phone] 09/29/16 8:07 PM

## 2016-09-29 NOTE — Patient Instructions (Signed)
  Please complete the assigned speech therapy homework and return it to your next session.  

## 2016-09-29 NOTE — Therapy (Signed)
[facility] [address] [place], [place], [address] Phone: [phone]   Fax:  [phone]  Physical Therapy Treatment  Patient Details  Name: Abigail Campos MRN: [id number] Date of Birth: 1971-07-17 Referring Provider: Dr. Posey Pronto  Encounter Date: 09/29/2016      PT End of Session - 09/29/16 1554    Visit Number 12   Number of Visits 17   Date for PT Re-Evaluation 10/10/16   Authorization Type [employer]-240 visit limit (including HH and OP). Per Lattie Haw, pt used 24 total HHPT, [place], and [place] visits.   PT Start Time 1318   PT Stop Time 1400   PT Time Calculation (min) 42 min   Equipment Utilized During Treatment Gait belt   Activity Tolerance Patient tolerated treatment well   Behavior During Therapy WFL for tasks assessed/performed      Past Medical History:  Diagnosis Date  . Essential hypertension 10/19/2007   03-2010: metoprolol changed to bystolic (was not feeling well on it, no specific allergy or reaction)    . Headache(784.0)    developed migraines after 2nd pregnancy, had a negative CT of the head  . Hypertension   . Stroke [facility])     Past Surgical History:  Procedure Laterality Date  . ABDOMINAL HYSTERECTOMY  11-14-07   no oophorectomy per surgical report  . CESAREAN SECTION     [id number]  . INGUINAL HERNIA REPAIR     2004    Vitals:   09/29/16 1326  BP: 123/78  Pulse: 70  SpO2: 99%        Subjective Assessment - 09/29/16 1321    Subjective Pt denied falls since last visit. Pt reported she hasn't gotten Prozac yet and called MD's officed yesterday regarding Prozac but has not heard back from them yet.   Pertinent History HTN, hyperlipidemia, hx of migraine   Patient Stated Goals I want to walk and stand up without falling.    Currently in Pain? No/denies                Vestibular Assessment - 09/29/16 0001      Vestibulo-Occular Reflex   VOR 1 Head Only (x 1 viewing) WNL  and no dizziness.                 [facility] Adult PT Treatment/Exercise - 09/29/16 0001      Ambulation/Gait   Ambulation/Gait Yes   Ambulation/Gait Assistance 5: Supervision;4: Min guard   Ambulation/Gait Assistance Details Cues to improve wt. shifting onto LE and to improve RLE step length and R heel strike. Pt demo'd improved upright posture and looking straight ahead all with hemi-walker. Pt then amb. with LBQC. Cues for sequencing with [facility] and to improve deviations previously mentioned.   Ambulation Distance (Feet) 230 Feet  with hemi-walker and 174' and 13' with [facility].   Assistive device Hemi-walker;Large base quad cane   Gait Pattern Step-to pattern;Decreased stride length;Decreased stance time - left;Decreased step length - right;Decreased dorsiflexion - left;Left circumduction;Left genu recurvatum   Ambulation Surface Level;Indoor                PT Education - 09/29/16 1553    Education provided Yes   Education Details PT educated pt on Dr. Serita Grit msg regarding pt's BP and HR-she will need to notify PCP and they will need to adjust meds prn. However, pt's BP and HR WNL today and pt felt better, pt will mention BP/HR to MD tomorrow during appt, along  with Prozac request.    Person(s) Educated Patient   Methods Explanation   Comprehension Verbalized understanding          PT Short Term Goals - 09/17/16 1641      PT SHORT TERM GOAL #1   Title Pt will be IND in performing HEP to improve strength and balance. TARGET DATE FOR ALL STGS: 09/08/16   Status Partially Met     PT SHORT TERM GOAL #2   Title Perform BERG and write goals if appropriate.   Status Achieved     PT SHORT TERM GOAL #3   Title Pt will amb. 200' over even terrain with LRAD at MOD I level to improve functional mobility.    Status Partially Met     PT SHORT TERM GOAL #4   Title Pt will improve gait speed to 0.44f/sec with LRAD to decrease falls risk.    Status Not Met     PT SHORT TERM  GOAL #5   Title Pt will improve BERG score to >/=21/56 to decr. falls risk.    Status Achieved           PT Long Term Goals - 09/09/16 1501      PT LONG TERM GOAL #1   Title Pt will verbalize understanding or CVA risk factors and signs/symptoms to reduce risk of additional CVA. TARGET DATE FOR ALL LTGS: 10/06/16   Status On-going     PT LONG TERM GOAL #2   Title Pt will improve gait speed to >/=1.861fsec. with LRAD to reduce falls risk.    Status On-going     PT LONG TERM GOAL #3   Title Pt will amb. 400' over even/paved surfaces with LRAD at MOD I level to improve functional mobility.    Status On-going     PT LONG TERM GOAL #4   Title Pt will perform TUG with LRAD in </=30sec. to improve function and decr. falls risk.    Status On-going     PT LONG TERM GOAL #5   Title Pt will perform stand pivot txfs from chair to w/c at MOD I level to improve functional mobility.    Status On-going     PT LONG TERM GOAL #6   Title Pt will improve BERG score to >/=31/56 to decr. falls risk.    Baseline 27/56 on 09/07/16, therefore, PT revised long term BERG goal from 25/56 to 31[place]co 09/29/16 1555    Clinical Impression Statement Pt's BP and HR much improved today, and no c/o dizziness/lightheadedness during treatment or VOR assessment. Pt demonstrated progress, as she was able to amb. longer distances today with improved R step length and heel strike after minimal cues. Pt also able to amb. with LBQC vs. hemi-walker. Pt did require seated rest breaks after amb. 2/2 LLE fatigue. Continue with POC.    Rehab Potential Good   Clinical Impairments Affecting Rehab Potential co-morbidities   PT Frequency 2x / week   PT Duration 8 weeks   PT Treatment/Interventions ADLs/Self Care Home Management;Biofeedback;Canalith Repostioning;Electrical Stimulation;Cognitive remediation;Neuromuscular re-education;Balance training;Therapeutic exercise;Therapeutic  activities;Functional mobility training;Stair training;Gait training;DME Instruction;Orthotic Fit/Training;Patient/family education;Vestibular;Manual techniques   PT Next Visit Plan Continue gait training with hemi-walker and LBQC and new AFO, balance activities (backwards amb., heel/toe taps)    PT Home Exercise Plan Strengthening HEP.    Consulted and Agree with Plan of Care Patient;Family member/caregiver  Family Member Consulted pt's mom: Abigail Campos      Patient will benefit from skilled therapeutic intervention in order to improve the following deficits and impairments:  Abnormal gait, Decreased endurance, Decreased knowledge of use of DME, Decreased strength, Impaired UE functional use, Impaired sensation, Decreased balance, Decreased mobility, Decreased range of motion, Decreased coordination, Impaired flexibility, Decreased cognition  Visit Diagnosis: Other abnormalities of gait and mobility  Hemiplegia and hemiparesis following cerebral infarction affecting left dominant side [facility])     Problem List Patient Active Problem List   Diagnosis Date Noted  . Left flaccid hemiplegia 06/24/2016  . Ear itching   . Poor fluid intake   . Bradycardia, drug induced   . Poor nutrition   . Muscle spasticity   . Obesity (BMI 30.0-34.9) 04/08/2016  . Hyperlipidemia 04/08/2016  . Basal ganglia hemorrhage ([place]) 04/08/2016  . Aphasia as late effect of cerebrovascular accident   . Dysphagia as late effect of cerebrovascular disease   . Migraine with aura and without status migrainosus, not intractable   . HLD (hyperlipidemia)   . Benign essential HTN   . Gait disturbance, post-stroke   . Hemiplegia, post-stroke ([place])   . Hypokalemia   . Dysphagia, post-stroke   . Aphasia, post-stroke   . Bradycardia   . Headache, migraine   . Dysarthria, post-stroke   . Encephalopathy acute 04/02/2016  . ICH (intracerebral hemorrhage) (H[facility]) - R basal ganglia due to hypertensive emergency 04/01/2016  .  Upper airway cough syndrome 12/06/2015  . PCP NOTES >>> 09/25/2015  . Need for hepatitis B vaccination 10/19/2014  . Insomnia 12/28/2012  . Annual physical exam 11/23/2011  . Hemorrhoids 10/13/2011  . Morbid obesity ([place]) 02/26/2011  . CHEST PAIN UNSPECIFIED 03/28/2010  . Essential hypertension 10/19/2007  . Headache(784.0) 05/12/2007    Abigail Campos 09/29/2016, 3:57 PM  [place] [address] [place], [place], [address] Phone: [phone]   Fax:  [phone]  Name: Abigail Campos MRN: [id number] Date of Birth: 06-Apr-1971   Geoffry Paradise, PT,DPT 09/29/16 3:58 PM Phone: [phone] Fax: [phone]

## 2016-09-30 ENCOUNTER — Ambulatory Visit (INDEPENDENT_AMBULATORY_CARE_PROVIDER_SITE_OTHER): Payer: Managed Care, Other (non HMO) | Admitting: Neurology

## 2016-09-30 ENCOUNTER — Encounter: Payer: Self-pay | Admitting: Neurology

## 2016-09-30 VITALS — BP 130/86 | HR 71 | Ht 59.0 in | Wt 138.4 lb

## 2016-09-30 DIAGNOSIS — I610 Nontraumatic intracerebral hemorrhage in hemisphere, subcortical: Secondary | ICD-10-CM | POA: Diagnosis not present

## 2016-09-30 DIAGNOSIS — I10 Essential (primary) hypertension: Secondary | ICD-10-CM

## 2016-09-30 DIAGNOSIS — I69359 Hemiplegia and hemiparesis following cerebral infarction affecting unspecified side: Secondary | ICD-10-CM

## 2016-09-30 NOTE — Progress Notes (Signed)
STROKE NEUROLOGY FOLLOW UP NOTE  NAME: Abigail Campos DOB: Mar 15, 1971  REASON FOR VISIT: stroke follow up HISTORY FROM: pt and mom and sister  Today we had the pleasure of seeing Abigail Campos in follow-up at our Neurology Clinic. Pt was accompanied by mom and sister.   History Summary Abigail Campos is a 45 y.o. female with history of HA and HTN was admitted on 04/08/16 for severe HA and L sided weakness. CT showed a R BG hemorrhage. Repeat CT showed stable hematoma. CTA head and neck showed no AVM or aneurysm. TTE EF 60-65%. LDL 113 and A1C 6.0. BP was high on cleviprex initially but later controlled by multiple BP meds. She still has significant left hemiplegia. She was discharged to CIR after stabilization.   Follow up 06/24/16 - the patient has been doing fair. Stayed in CIR for 4 weeks and discharged home. Currently has home PT/OT/speech twice a week, however, pt still has left hemiplegia, not able to move. She also follows with Dr. Posey Pronto and mentioned botox injection but not started yet. BP in good control at home today 127/94. She is currently applying for social security.   Interval History During the interval time, the patient has been doing the same. Still has left hemiplegia, following with Dr. Posey Pronto. On her last visit with Dr. Posey Pronto on 08/13/2016, her Prozac was discontinued. Patient stated that last weekend she had two crying episodes without clear reason. She has not had any more episodes since then. She thought that was due to discontinuation of Prozac, and she would like Prozac to be resumed. I told her the episodes occurred 1.5 months after discontinuation of medication, likely unrelated. But she can further discuss with Dr. Posey Pronto in 1 week at next clinical visit. BP 130/86, currently on Lasix, lisinopril, and bystolic.   REVIEW OF SYSTEMS: Full 14 system review of systems performed and notable only for those listed below and in HPI above, all others are  negative:  Constitutional:   Cardiovascular:  Ear/Nose/Throat:   Skin:  Eyes:  Respiratory:   Gastroitestinal:   Genitourinary:  Hematology/Lymphatic:   Endocrine:  Musculoskeletal:  Allergy/Immunology:   Neurological:  weakness Psychiatric:  Sleep:   The following represents the patient's updated allergies and side effects list: Allergies  Allergen Reactions  . Bee Venom Anaphylaxis  . Peanut-Containing Drug Products Anaphylaxis  . Hydrocodone Hives    Generalize itching w/o rash  . Ambien [Zolpidem Tartrate] Other (See Comments)    forgetfulness  . Aspirin Swelling    Reports blisters w/ ASA but pt reports is ok w/ Motrin, Advil, naproxen  . Latex Hives    The neurologically relevant items on the patient's problem list were reviewed on today's visit.  Neurologic Examination  A problem focused neurological exam (12 or more points of the single system neurologic examination, vital signs counts as 1 point, cranial nerves count for 8 points) was performed.  Blood pressure 130/86, pulse 71, height 4\' 11"  (1.499 m), weight 138 lb 6.4 oz (62.8 kg).  General - Well nourished, well developed, in no apparent distress.  Ophthalmologic - Fundi not visualized due to eye movement.  Cardiovascular - Regular rate and rhythm.  Mental Status -  Level of arousal and orientation to time, place, and person were intact. Language including expression, naming, repetition, comprehension was assessed and found intact. Fund of Knowledge was assessed and was intact.  Cranial Nerves II - XII - II - Visual field intact OU. III,  IV, VI - Extraocular movements intact. V - Facial sensation intact bilaterally. VII - left facial droop. VIII - Hearing & vestibular intact bilaterally. X - Palate elevates symmetrically. XI - Chin turning & shoulder shrug intact bilaterally. XII - Tongue protrusion intact.  Motor Strength - The patient's strength was normal in RUE and RLE, but 0/5 LUE and LLE,  LLE in brace. Bulk was normal and fasciculations were absent.   Motor Tone - Muscle tone was assessed at the neck and appendages and was mildly increased on the left UE and LE.  Reflexes - The patient's reflexes were 1+ in all extremities and she had no pathological reflexes.  Sensory - Light touch, temperature/pinprick were assessed and were decreased on the left, 50% comparing with right    Coordination - The patient had normal movements in the right hand with no ataxia or dysmetria.  Tremor was absent.  Gait and Station - wheelchair bound, gait not tested.   Data reviewed: I personally reviewed the images and agree with the radiology interpretations.  Ct Head Wo Contrast 04/04/2016 IMPRESSION: 1. Stable right lentiform nucleus hemorrhage with slightly increased localized vasogenic edema. Localized mass effect with 4 mm right-to-left shift not significantly changed. 2. Decreasing small volume acute subarachnoid hemorrhage. 04/02/2016 IMPRESSION: Stable RIGHT putaminal intracerebral hemorrhage, favored to represent a hypertensive related event.  04/01/2016 IMPRESSION: Intraparenchymal hemorrhage arising from the posterior, lateral right basal ganglia with subarachnoid hemorrhage extending into the sylvian fissure on the right. There is mass effect with mild midline shift toward the left of approximately 7 mm. Elsewhere gray-white compartments normal. Mild paranasal sinus disease in the maxillary antra bilaterally. Volume of hemorrhage in the right basal ganglia region is approximately 26 cubic cm. Note that there is additional hemorrhage in the subarachnoid space on the right which cannot be easily quantified with respect to specific volume of hemorrhage.   Ct Angio Head and neck W/cm &/or Wo Cm 04/02/2016 IMPRESSION: 1. Negative arterial findings on CTA head and neck, aside from mild calcified atherosclerosis of the left ICA siphon. 2. Stable hyperdense intra-axial hemorrhage in the right  hemisphere since yesterday. Negative CTA Spot Sign, and no evidence of associated vascular malformation. Estimated blood volume of 21 mL. Mildly increased surrounding edema, but stable mild regional mass effect. 3. Poor dentition.   2D echocardiogram  Normal LV size and systolic function, EF 123456. Mild LV hypertrophy. Moderate diastolic dysfunction. Normal RV size and systolic function. No significant valvular abnormalities.  Component     Latest Ref Rng 04/04/2016 05/18/2016  Cholesterol     0 - 200 mg/dL 169 102  Triglycerides     0.0 - 149.0 mg/dL 94 98.0  HDL Cholesterol     >39.00 mg/dL 37 (L) 30.60 (L)  VLDL     0.0 - 40.0 mg/dL 19 19.6  LDL (calc)     0 - 99 mg/dL 113 (H) 52  Total CHOL/HDL Ratio      4.6 3  NonHDL       71.88  Hemoglobin A1C     4.8 - 5.6 % 6.0 (H)   Mean Plasma Glucose      126   TSH     0.350 - 4.500 uIU/mL 0.744   Vitamin B12     180 - 914 pg/mL 280   RPR     Non Reactive Non Reactive   HIV     Non Reactive Non Reactive     Assessment: As you may recall, she is  a 45 y.o. African American female with PMH of HA and HTN was admitted on 04/08/16 for severe HA and L sided weakness. CT showed a R BG hemorrhage. CTA head and neck showed no AVM or aneurysm. TTE EF 60-65%. LDL 113 and A1C 6.0. Currently has home PT/OT/speech twice a week, however, pt still has significant left hemiplegia. She also follows with Dr. Posey Pronto in [place] for rehab and botox injection. BP in good control at home with 3 BP meds. ASA allergy, on plavix for stroke prevention. With the resolution of hematoma, will need to repeat CTA head to rule out AVM which could be masked by hematoma initially.   Plan:  - continue lipitor and plavix for stroke prevention - continue to follow up with Dr. Posey Pronto for rehab - discuss with Dr. Posey Pronto about crying episodes with discontinuation of prozac and consider botox injection for left hand.  - Follow up with your primary care physician for stroke risk  factor modification. Recommend maintain blood pressure goal <130/80, diabetes with hemoglobin A1c goal below 7.0% and lipids with LDL cholesterol goal below 70 mg/dL.  - check BP at home and record - work with PT/OT/speech aggressively  - will do CTA head to rule out any vessel malformation which was masked by bleeding at that time.  - follow up in 4 months.   I spent more than 25 minutes of face to face time with the patient. Greater than 50% of time was spent in counseling and coordination of care. We discussed about follow up with PMR Dr. Posey Pronto, repeat CTA head and continued PT/OT.   Orders Placed This Encounter  Procedures  . CT ANGIO HEAD W OR WO CONTRAST    Standing Status:   Future    Standing Expiration Date:   11/30/2017    Order Specific Question:   If indicated for the ordered procedure, I authorize the administration of contrast media per Radiology protocol    Answer:   Yes    Order Specific Question:   Reason for Exam (SYMPTOM  OR DIAGNOSIS REQUIRED)    Answer:   rule out AVM    Order Specific Question:   Is patient pregnant?    Answer:   No    Order Specific Question:   Preferred imaging location?    Answer:   Internal    No orders of the defined types were placed in this encounter.   Patient Instructions  - continue lipitor and plavix for stroke prevention - continue to follow up with Dr. Posey Pronto for rehab - discuss with Dr. Posey Pronto about crying episodes with discontinuation of prozac and consider botox injection for left hand.  - Follow up with your primary care physician for stroke risk factor modification. Recommend maintain blood pressure goal <130/80, diabetes with hemoglobin A1c goal below 7.0% and lipids with LDL cholesterol goal below 70 mg/dL.  - check BP at home and record - work with PT/OT/speech aggressively  - will do CTA head to rule out any vessel malformation which was masked by bleeding at that time.  - follow up in 4 months.    Rosalin Hawking, MD  PhD [facility] [address] [place], [place] [address] [phone]

## 2016-09-30 NOTE — Patient Instructions (Addendum)
-   continue lipitor and plavix for stroke prevention - continue to follow up with Dr. Posey Pronto for rehab - discuss with Dr. Posey Pronto about crying episodes with discontinuation of prozac and consider botox injection for left hand.  - Follow up with your primary care physician for stroke risk factor modification. Recommend maintain blood pressure goal <130/80, diabetes with hemoglobin A1c goal below 7.0% and lipids with LDL cholesterol goal below 70 mg/dL.  - check BP at home and record - work with PT/OT/speech aggressively  - will do CTA head to rule out any vessel malformation which was masked by bleeding at that time.  - follow up in 4 months.

## 2016-10-01 ENCOUNTER — Ambulatory Visit: Payer: Managed Care, Other (non HMO)

## 2016-10-01 ENCOUNTER — Ambulatory Visit: Payer: Managed Care, Other (non HMO) | Admitting: Occupational Therapy

## 2016-10-01 DIAGNOSIS — R2689 Other abnormalities of gait and mobility: Secondary | ICD-10-CM

## 2016-10-01 DIAGNOSIS — I69352 Hemiplegia and hemiparesis following cerebral infarction affecting left dominant side: Secondary | ICD-10-CM

## 2016-10-01 DIAGNOSIS — R41841 Cognitive communication deficit: Secondary | ICD-10-CM

## 2016-10-01 DIAGNOSIS — R471 Dysarthria and anarthria: Secondary | ICD-10-CM

## 2016-10-01 DIAGNOSIS — R278 Other lack of coordination: Secondary | ICD-10-CM

## 2016-10-01 DIAGNOSIS — I69115 Cognitive social or emotional deficit following nontraumatic intracerebral hemorrhage: Secondary | ICD-10-CM

## 2016-10-01 NOTE — Therapy (Signed)
[facility] [address] [place], [place], [address] Phone: [phone]   Fax:  [phone]  Physical Therapy Treatment  Patient Details  Name: Abigail Campos MRN: [id number] Date of Birth: July 17, 1971 Referring Provider: Dr. Posey Pronto  Encounter Date: 10/01/2016      PT End of Session - 10/01/16 1522    Visit Number 13   Number of Visits 17   Date for PT Re-Evaluation 10/10/16   Authorization Type [employer]-240 visit limit (including HH and OP). Per Lattie Haw, pt used 24 total HHPT, [place], and [place] visits.   PT Start Time 1316   PT Stop Time 1359   PT Time Calculation (min) 43 min   Equipment Utilized During Treatment Gait belt   Activity Tolerance Patient tolerated treatment well   Behavior During Therapy WFL for tasks assessed/performed      Past Medical History:  Diagnosis Date  . Essential hypertension 10/19/2007   03-2010: metoprolol changed to bystolic (was not feeling well on it, no specific allergy or reaction)    . Hypertension   . Stroke [facility])     Past Surgical History:  Procedure Laterality Date  . ABDOMINAL HYSTERECTOMY  11-14-07   no oophorectomy per surgical report  . CESAREAN SECTION     [id number]  . INGUINAL HERNIA REPAIR     2004    There were no vitals filed for this visit.      Subjective Assessment - 10/01/16 1321    Subjective Pt denied falls since last visit. Pt reported she saw Dr. Erlinda Hong yesterday, and he recommended she talk to Dr. Posey Pronto regarding Prozac and botox for L hand.   Pertinent History HTN, hyperlipidemia, hx of migraine   Patient Stated Goals I want to walk and stand up without falling.    Currently in Pain? No/denies                         [facility] Adult PT Treatment/Exercise - 10/01/16 1329      Ambulation/Gait   Ambulation/Gait Yes   Ambulation/Gait Assistance 5: Supervision;4: Min guard   Ambulation/Gait Assistance Details Cues to improve upright  posture, R step length, and R heel strike.  Pt required rest breask (seated and standing 2/2 fatigue). Pt also required seated break to allow PT to tighten shoelaces for R shoe, as pt felt like foot was sliding.    Ambulation Distance (Feet) 95 Feet  60'x3, 155'   Assistive device Large base quad cane   Gait Pattern Decreased stride length;Decreased stance time - left;Decreased step length - right;Decreased dorsiflexion - left;Left circumduction;Left genu recurvatum;Step-to pattern;Step-through pattern  progressed to step-through pattern   Ambulation Surface Level;Indoor                PT Education - 10/01/16 1524    Education provided Yes   Education Details PT discussed adding more PT/OT/Speech sessions, per OT/PT/Speech discussions. PT educated pt that goals will be assessed next week and updated prn. Pt agreeable.          PT Short Term Goals - 09/17/16 1641      PT SHORT TERM GOAL #1   Title Pt will be IND in performing HEP to improve strength and balance. TARGET DATE FOR ALL STGS: 09/08/16   Status Partially Met     PT SHORT TERM GOAL #2   Title Perform BERG and write goals if appropriate.   Status Achieved     PT SHORT  TERM GOAL #3   Title Pt will amb. 200' over even terrain with LRAD at MOD I level to improve functional mobility.    Status Partially Met     PT SHORT TERM GOAL #4   Title Pt will improve gait speed to 0.1f/sec with LRAD to decrease falls risk.    Status Not Met     PT SHORT TERM GOAL #5   Title Pt will improve BERG score to >/=21/56 to decr. falls risk.    Status Achieved           PT Long Term Goals - 09/09/16 1501      PT LONG TERM GOAL #1   Title Pt will verbalize understanding or CVA risk factors and signs/symptoms to reduce risk of additional CVA. TARGET DATE FOR ALL LTGS: 10/06/16   Status On-going     PT LONG TERM GOAL #2   Title Pt will improve gait speed to >/=1.826fsec. with LRAD to reduce falls risk.    Status On-going      PT LONG TERM GOAL #3   Title Pt will amb. 400' over even/paved surfaces with LRAD at MOD I level to improve functional mobility.    Status On-going     PT LONG TERM GOAL #4   Title Pt will perform TUG with LRAD in </=30sec. to improve function and decr. falls risk.    Status On-going     PT LONG TERM GOAL #5   Title Pt will perform stand pivot txfs from chair to w/c at MOD I level to improve functional mobility.    Status On-going     PT LONG TERM GOAL #6   Title Pt will improve BERG score to >/=31/56 to decr. falls risk.    Baseline 27/56 on 09/07/16, therefore, PT revised long term BERG goal from 25/56 to [place] 10/01/16 1522    Clinical Impression Statement Pt demonstrated progress, as she progressed from requiring min guard to S during last 60' of amb. Pt required minimal cues to improve R step length and R heel strike. Pt amb. entire session with LBQC with no LOB. Continue with POC.    Rehab Potential Good   Clinical Impairments Affecting Rehab Potential co-morbidities   PT Frequency 2x / week   PT Duration 8 weeks   PT Treatment/Interventions ADLs/Self Care Home Management;Biofeedback;Canalith Repostioning;Electrical Stimulation;Cognitive remediation;Neuromuscular re-education;Balance training;Therapeutic exercise;Therapeutic activities;Functional mobility training;Stair training;Gait training;DME Instruction;Orthotic Fit/Training;Patient/family education;Vestibular;Manual techniques   PT Next Visit Plan Check LTGs and renew. Make sure pt schedule add'l OT, PT, and speech visits.   PT Home Exercise Plan Strengthening HEP.    Consulted and Agree with Plan of Care Patient;Family member/caregiver   Family Member Consulted pt's mom: [name]    Patient will benefit from skilled therapeutic intervention in order to improve the following deficits and impairments:  Abnormal gait, Decreased endurance, Decreased knowledge of use of DME,  Decreased strength, Impaired UE functional use, Impaired sensation, Decreased balance, Decreased mobility, Decreased range of motion, Decreased coordination, Impaired flexibility, Decreased cognition  Visit Diagnosis: Other abnormalities of gait and mobility  Hemipleg[facility] and [facility]iparesis [facility]    Problem List Patient Active Problem List   Diagnosis Date Noted  . Left flaccid hemiplegia 06/24/2016  . Ear itching   . Poor fluid intake   . Bradycardia, drug induced   .  Poor nutrition   . Muscle spasticity   . Obesity (BMI 30.0-34.9) 04/08/2016  . Hyperlipidemia 04/08/2016  . Basal ganglia hemorrhage ([place]) 04/08/2016  . Aphasia as late effect of cerebrovascular accident   . Dysphagia as late effect of cerebrovascular disease   . Migraine with aura and without status migrainosus, not intractable   . HLD (hyperlipidemia)   . Benign essential HTN   . Gait disturbance, post-stroke   . Hemiplegia, post-stroke ([place])   . Hypokalemia   . Dysphagia, post-stroke   . Aphasia, post-stroke   . Bradycardia   . Headache, migraine   . Dysarthria, post-stroke   . Encephalopathy acute 04/02/2016  . ICH (intracerebral hemorrhage) (HCC) - R basal ganglia due to hypertensive emergency 04/01/2016  . Upper airway cough syndrome 12/06/2015  . PCP NOTES >>> 09/25/2015  . Need for hepatitis B vaccination 10/19/2014  . Insomnia 12/28/2012  . Annual physical exam 11/23/2011  . Hemorrhoids 10/13/2011  . Morbid obesity ([place]) 02/26/2011  . CHEST PAIN UNSPECIFIED 03/28/2010  . Essential hypertension 10/19/2007  . Headache(784.0) 05/12/2007    , L 10/01/2016, 3:25 PM  [facility] [address] [place], [place], [address] Phone: [phone]   Fax:  [phone]  Name: Abigail Campos MRN: [id number] Date of Birth: [birth year]   Geoffry Paradise, PT,DPT 10/01/16  3:26 PM Phone: [phone] Fax: [phone]

## 2016-10-01 NOTE — Therapy (Signed)
[facility] [address] [place], [place], [address] Phone: [phone]   Fax:  [phone]  Occupational Therapy Treatment  Patient Details  Name: Abigail Campos MRN: [id number] Date of Birth: [birth year] Referring Provider: Dr. Posey Pronto  Encounter Date: 10/01/2016      OT End of Session - 10/01/16 1445    Visit Number 9   Number of Visits 25   Date for OT Re-Evaluation 12/09/16   Authorization Type Aet[employer] (240 visit limit combined with home health, estimated HH used 24 combined), no auth required    Authorization Time Period wk 5/12   OT Start Time 1406   OT Stop Time 1445   OT Time Calculation (min) 39 min   Activity Tolerance Patient tolerated treatment well   Behavior During Therapy [facility] for tasks assessed/performed      Past Medical History:  Diagnosis Date  . Essential hypertension 10/19/2007   03-2010: metoprolol changed to bystolic (was not feeling well on it, no specific allergy or reaction)    . Hypertension   . Stroke [facility])     Past Surgical History:  Procedure Laterality Date  . ABDOMINAL HYSTERECTOMY  11-14-07   no oophorectomy per surgical report  . CESAREAN SECTION     [id number]  . INGUINAL HERNIA REPAIR     2004    There were no vitals filed for this visit.      Subjective Assessment - 10/01/16 1440    Subjective  Pt reports that she feels ok,  more spasms today as she is due for her medication--will pick up after therapy today   Pertinent History see Epic   Patient Stated Goals to be able to use left arm   Currently in Pain? No/denies       Pt able to don/doff shirt mod I.   Neuro re-ed:  Wt. Shifts to the L side with LUE supported for light wt. Bearing through elbow in abduction with min cues/facilitation.  Light wt. Bearing through L hand in abduction with lateral wt. Shifts and body on arm movements with min facilitation and min-mod cues.    AAROM shoulder flex/elbow ext  and horizontal abduction/ER with min-mod cues for compensation and min facilitation.  Cueing throughout session for postural alignment and avoiding trunk compensation with attempts to activate LUE.    NMES x10 min to L wrist/finger extensors 50pps, 250 pulse width, 10sec cycle, 2sec ramp, intensity=20 for decr tone/neuro re-ed with no adverse reactions.                     OT Short Term Goals - 10/01/16 1446      OT SHORT TERM GOAL #1   Title Pt/ caregiver will be I with HEP.   Time 6   Period Weeks   Status New     OT SHORT TERM GOAL #2   Title Pt/ family will verbalize understanding of LUE positioning to minimize pain and risk for injury, including splinting PRN.   Time 6   Period Weeks   Status Achieved  10/01/16     OT SHORT TERM GOAL #3   Title Pt will donn shirt with min A.   Time 6   Period Weeks   Status Achieved  10/01/16  mod I     OT SHORT TERM GOAL #4   Title Pt will demonstrate 90* P/ROM shoulder flexion in LUE with pain less than or equal to 3/10.   Time 6  Period Weeks   Status New     OT SHORT TERM GOAL #5   Title Pt will perform simple snack prep modified independently.   Time 6   Period Weeks   Status New     OT SHORT TERM GOAL #6   Title Pt will perform bathing at a supervision level   Time 6   Period Weeks   Status Achieved  10/01/16 met per pt report           OT Long Term Goals - 10/01/16 1653      OT LONG TERM GOAL #1   Title Pt will perform LB dressing with min A.   Time 12   Period Weeks   Status Achieved  10/01/16:  needs A for L shoe/brace     OT LONG TERM GOAL #2   Title Pt will use LUE as a stabilizer/ gross A with min A.   Time 12   Period Weeks   Status New     OT LONG TERM GOAL #3   Title Pt will perform simple home management tasks with min A.   Time 6   Period Weeks   Status New     OT LONG TERM GOAL #4   Title Pt will demonstrate adequate standing balance to retrieve a light weight object  from overhead cabinet modified independently with RUE without LOB in prep for IADLS.   Time 12   Period Weeks   Status New     OT LONG TERM GOAL #5   Title Pt will perform shower transfers modified independently   Time 12   Period Weeks   Status New               Plan - 10/01/16 1445    Clinical Impression Statement Pt is progressing towards goals with improved ADL performance and is beginning to initiate AAROM at shoulder.     Rehab Potential Good   Clinical Impairments Affecting Rehab Potential severity of deficits   OT Frequency 2x / week   OT Duration 12 weeks   OT Treatment/Interventions Self-care/ADL training;Moist Heat;Fluidtherapy;DME and/or AE instruction;Splinting;Patient/family education;Balance Teacher, music;Contrast Bath;Ultrasound;Therapeutic exercise;Therapeutic activities;Cognitive remediation/compensation;Passive range of motion;Functional Mobility Training;Neuromuscular education;Cryotherapy;Electrical Stimulation;Parrafin;Energy conservation;Manual Therapy;Visual/perceptual remediation/compensation   Plan check STGs next week, neuro re-ed   OT Home Exercise Plan 09/07/16:  resting splint wear/care   Consulted and Agree with Plan of Care Patient;Family member/caregiver   Family Member Consulted husband      Patient will benefit from skilled therapeutic intervention in order to improve the following deficits and impairments:  Abnormal gait, Decreased coordination, Decreased range of motion, Difficulty walking, Impaired flexibility, Impaired sensation, Increased edema, Decreased safety awareness, Decreased endurance, Decreased activity tolerance, Decreased knowledge of precautions, Increased fascial restricitons, Impaired tone, Pain, Impaired UE functional use, Decreased knowledge of use of DME, Decreased balance, Decreased cognition, Decreased mobility, Decreased strength, Impaired perceived functional ability, Impaired vision/preception  Visit  Diagnosis: Hemiplegia and hemiparesis following cerebral infarction affecting left dominant side (HCC)  Other lack of coordination  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 Left flaccid hemiplegia 06/24/2016  . Ear itching   . Poor fluid intake   . Bradycardia, drug induced   . Poor nutrition   . Muscle spasticity   . Obesity (BMI 30.0-34.9) 04/08/2016  . Hyperlipidemia 04/08/2016  . Basal ganglia hemorrhage (HCC) 04/08/2016  . Aphasia as late effect  of cerebrovascular accident   . Dysphagia as late effect of cerebrovascular disease   . Migraine with aura and without status migrainosus, not intractable   . HLD (hyperlipidemia)   . Benign essential HTN   . Gait disturbance, post-stroke   . Hemiplegia, post-stroke ([place])   . Hypokalemia   . Dysphagia, post-stroke   . Aphasia, post-stroke   . Bradycardia   . Headache, migraine   . Dysarthria, post-stroke   . Encephalopathy acute 04/02/2016  . ICH (intracerebral hemorrhage) (HCC) - R basal ganglia due to hypertensive emergency 04/01/2016  . Upper airway cough syndrome 12/06/2015  . PCP NOTES >>> 09/25/2015  . Need for hepatitis B vaccination 10/19/2014  . Insomnia 12/28/2012  . Annual physical exam 11/23/2011  . Hemorrhoids 10/13/2011  . Morbid obesity ([place]) 02/26/2011  . CHEST PAIN UNSPECIFIED 03/28/2010  . Essential hypertension 10/19/2007  . Headache(784.0) 05/12/2007    [facility] 10/01/2016, 4:53 PM  [facility] [address] [place], [place], [address] Phone: [phone]   Fax:  [phone]  Name: ADALINA DOPSON MRN: [id number] Date of Birth: 08-05-1971   Vianne Bulls, OTR/L [facility] [address] [place], [place]  [address] [phone] phone [phone] 10/01/16 4:58 PM

## 2016-10-01 NOTE — Therapy (Addendum)
[facility] [address] [place], [place], [address] Phone: [phone]   Fax:  [phone]  Speech Language Pathology Treatment  Patient Details  Name: Abigail Campos MRN: [id number] Date of Birth: 1971/10/19 Referring Provider: Delice Lesch, M.D.  Encounter Date: 10/01/2016      End of Session - 10/01/16 1533    Visit Number 11   Number of Visits 16   Date for SLP Re-Evaluation 10/23/16   Authorization - Number of Visits 240   SLP Start Time 1450   SLP Stop Time  7342   SLP Time Calculation (min) 40 min   Activity Tolerance Patient tolerated treatment well      Past Medical History:  Diagnosis Date  . Essential hypertension 10/19/2007   03-2010: metoprolol changed to bystolic (was not feeling well on it, no specific allergy or reaction)    . Hypertension   . Stroke [facility])     Past Surgical History:  Procedure Laterality Date  . ABDOMINAL HYSTERECTOMY  11-14-07   no oophorectomy per surgical report  . CESAREAN SECTION     [id number]  . INGUINAL HERNIA REPAIR     2004    There were no vitals filed for this visit.      Subjective Assessment - 10/01/16 1517    Subjective Pt provided 1/2 of homework to SLP.   Currently in Pain? No/denies               ADULT SLP TREATMENT - 10/01/16 1518      General Information   Behavior/Cognition Alert;Cooperative;Pleasant mood     Treatment Provided   Treatment provided Cognitive-Linquistic     Cognitive-Linquistic Treatment   Treatment focused on Cognition   Skilled Treatment SLP focused on reasoning tasks today to work with attention, awareness, and reasoning skills. Pt with error on first of two puzzles without knowledge. SLP cues (mod verbal) for pt to realize error. In subsequent puzzles of same complexity, pt was 100% successful independently. In simple to mod complex tasks, pt req'd mod verbal cues occasionally from SLP.     Assessment /  Recommendations / Plan   Plan Continue with current plan of care     Progression Toward Goals   Progression toward goals Progressing toward goals            SLP Short Term Goals - 10/01/16 1534      SLP SHORT TERM GOAL #1   Title pt will name 3 deficit areas over three sessions   Time 1   Period Weeks   Status Not Met     SLP SHORT TERM GOAL #2   Title pt will demo selective attention to therapy tasks for 10 minutes in min-mod noisy environment   Status Achieved     SLP SHORT TERM GOAL #3   Title pt will complete tasks of simple cognitive linguistic organization with 90% success and rare min A   Status Achieved     SLP SHORT TERM GOAL #4   Title pt will attempt to self correct misarticulated speech in 50% of opportunities in structured speech tasks   Status Deferred  due to focus on cognitive goals          SLP Long Term Goals - 10/01/16 1534      SLP LONG TERM GOAL #1   Title pt will demo alternating attention in simple cognitive linguistic tasks with appropriate switch times, with compensations   Time 4   Period Weeks  Status On-going     SLP LONG TERM GOAL #2   Title pt will demo emergent awareness in cognitive linguistic tasks by self correcting errors in 80% of opportunities with nonverbal cues   Time 4   Period Weeks   Status On-going     SLP LONG TERM GOAL #3   Title pt will demo functional organization in mod complex linguistic tasks with modified independence   Time 4   Period Weeks   Status On-going     SLP LONG TERM GOAL #4   Title pt will demo appropriate rate of speech in 5 minutes simple to mod complex conversation with occasional nonverbal cues   Time 4   Period Weeks   Status On-going     SLP LONG TERM GOAL #5   Title pt will demo HEP for dysarthria with min A   Time 4   Period Weeks   Status On-going          Plan - 10/01/16 1533    Clinical Impression Statement Pt continues to benefit from continued skilled ST intervention  with focus on higher level cognitive goals including attention, awareness, and reasoning.   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 Left flaccid hemiplegia 06/24/2016  . Ear itching   . Poor fluid intake   . Bradycardia, drug induced   . Poor nutrition   . Muscle spasticity   . Obesity (BMI 30.0-34.9) 04/08/2016  . Hyperlipidemia 04/08/2016  . Basal ganglia hemorrhage ([place]) 04/08/2016  . Aphasia as late effect of cerebrovascular accident   . Dysphagia as late effect of cerebrovascular disease   . Migraine with aura and without status migrainosus, not intractable   . HLD (hyperlipidemia)   . Benign essential HTN   . Gait disturbance, post-stroke   . Hemiplegia, post-stroke ([place])   . Hypokalemia   . Dysphagia, post-stroke   . Aphasia, post-stroke   . Bradycardia   . Headache, migraine   . Dysarthria, post-stroke   . Encephalopathy acute 04/02/2016  . ICH (intracerebral hemorrhage) (HCC) - R basal ganglia due to hypertensive emergency 04/01/2016  . Upper airway cough syndrome 12/06/2015  . PCP NOTES >>> 09/25/2015  . Need for hepatitis B vaccination 10/19/2014  . Insomnia 12/28/2012  . Annual physical exam 11/23/2011  . Hemorrhoids 10/13/2011  . Morbid obesity ([place]) 02/26/2011  . CHEST PAIN UNSPECIFIED 03/28/2010  . Essential hypertension 10/19/2007  .  Headache(784.0) 05/12/2007    [facility] [place]  10/01/2016, 3:35 PM  [facility] [address] [place], [place], [address] Phone: [phone]   Fax:  [phone]   Name: Abigail Campos MRN: [id number] Date of Birth: [birth year]

## 2016-10-01 NOTE — Patient Instructions (Signed)
  Please complete the assigned speech therapy homework and return it to your next session.  

## 2016-10-06 ENCOUNTER — Ambulatory Visit: Payer: Managed Care, Other (non HMO)

## 2016-10-06 ENCOUNTER — Other Ambulatory Visit: Payer: Self-pay | Admitting: Family

## 2016-10-06 ENCOUNTER — Ambulatory Visit: Payer: Managed Care, Other (non HMO) | Admitting: Occupational Therapy

## 2016-10-06 DIAGNOSIS — I69015 Cognitive social or emotional deficit following nontraumatic subarachnoid hemorrhage: Secondary | ICD-10-CM

## 2016-10-06 DIAGNOSIS — R278 Other lack of coordination: Secondary | ICD-10-CM

## 2016-10-06 DIAGNOSIS — I69115 Cognitive social or emotional deficit following nontraumatic intracerebral hemorrhage: Secondary | ICD-10-CM

## 2016-10-06 DIAGNOSIS — R471 Dysarthria and anarthria: Secondary | ICD-10-CM

## 2016-10-06 DIAGNOSIS — I69352 Hemiplegia and hemiparesis following cerebral infarction affecting left dominant side: Secondary | ICD-10-CM | POA: Diagnosis not present

## 2016-10-06 DIAGNOSIS — R2689 Other abnormalities of gait and mobility: Secondary | ICD-10-CM

## 2016-10-06 DIAGNOSIS — M25512 Pain in left shoulder: Secondary | ICD-10-CM

## 2016-10-06 NOTE — Therapy (Signed)
[facility] [address] [place], [place], [address] Phone: [phone]   Fax:  [phone]  Occupational Therapy Treatment  Patient Details  Name: Abigail Campos MRN: [id number] Date of Birth: Nov 06, 1971 Referring Provider: Dr. Allena Katz  Encounter Date: 10/06/2016      OT End of Session - 10/06/16 1749    Visit Number 10   Number of Visits 25   Date for OT Re-Evaluation 12/09/16   Authorization Type [employer] (240 visit limit combined with home health, estimated HH used 24 combined), no auth required    Authorization Time Period wk 6/12   OT Start Time 1451   OT Stop Time 1531   OT Time Calculation (min) 40 min   Activity Tolerance Patient tolerated treatment well   Behavior During Therapy [facility] for tasks assessed/performed      Past Medical History:  Diagnosis Date  . Essential hypertension 10/19/2007   03-2010: metoprolol changed to bystolic (was not feeling well on it, no specific allergy or reaction)    . Hypertension   . Stroke [facility])     Past Surgical History:  Procedure Laterality Date  . ABDOMINAL HYSTERECTOMY  11-14-07   no oophorectomy per surgical report  . CESAREAN SECTION     [id number]  . INGUINAL HERNIA REPAIR     2004    There were no vitals filed for this visit.      Subjective Assessment - 10/06/16 1548    Subjective  "My hand feels a little tingly"   Pertinent History see Epic   Patient Stated Goals to be able to use left arm   Currently in Pain? No/denies       In supine, attempted AAROM shoulder flex, ER/IR, elbow flex/ext with pt attempting to initiate--pt with trace contraction felt, incr with elbow ext and ER/IR.  In supine, rolling to the L to wt. Bear through L side with no pain and min cueing.  Transitioned to rolling to the L for supine>sit transfer.  Light wt. Bearing through L hand in abduction with lateral wt. Shifts/body on arm movements with min-mod cues, min  facilitation.                       OT Education - 10/06/16 1751    Education Details HEP--see pt instructions (shoulder self ROM and AAROM)   Person(s) Educated Patient   Methods Explanation;Demonstration;Verbal cues;Handout   Comprehension Verbalized understanding;Returned demonstration;Verbal cues required;Need further instruction          OT Short Term Goals - 10/06/16 1750      OT SHORT TERM GOAL #1   Title Pt/ caregiver will be I with HEP.   Time 6   Period Weeks   Status On-going     OT SHORT TERM GOAL #2   Title Pt/ family will verbalize understanding of LUE positioning to minimize pain and risk for injury, including splinting PRN.   Time 6   Period Weeks   Status Achieved  10/01/16     OT SHORT TERM GOAL #3   Title Pt will donn shirt with min A.   Time 6   Period Weeks   Status Achieved  10/01/16  mod I     OT SHORT TERM GOAL #4   Title Pt will demonstrate 90* P/ROM shoulder flexion in LUE with pain less than or equal to 3/10.   Time 6   Period Weeks   Status Achieved  10/06/16 met to  90* with no pain     OT SHORT TERM GOAL #5   Title Pt will perform simple snack prep modified independently.   Time 6   Period Weeks   Status New     OT SHORT TERM GOAL #6   Title Pt will perform bathing at a supervision level   Time 6   Period Weeks   Status Achieved  10/01/16 met per pt report           OT Long Term Goals - 10/01/16 1653      OT LONG TERM GOAL #1   Title Pt will perform LB dressing with min A.   Time 12   Period Weeks   Status Achieved  10/01/16:  needs A for L shoe/brace     OT LONG TERM GOAL #2   Title Pt will use LUE as a stabilizer/ gross A with min A.   Time 12   Period Weeks   Status New     OT LONG TERM GOAL #3   Title Pt will perform simple home management tasks with min A.   Time 6   Period Weeks   Status New     OT LONG TERM GOAL #4   Title Pt will demonstrate adequate standing balance to retrieve a  light weight object from overhead cabinet modified independently with RUE without LOB in prep for IADLS.   Time 12   Period Weeks   Status New     OT LONG TERM GOAL #5   Title Pt will perform shower transfers modified independently   Time 12   Period Weeks   Status New               Plan - 10/06/16 1749    Clinical Impression Statement Pt is wt. shifting with greater confidence to the L side if various positions.  Pt is progressing towards goals.   Rehab Potential Good   Clinical Impairments Affecting Rehab Potential severity of deficits   OT Frequency 2x / week   OT Duration 12 weeks   OT Treatment/Interventions Self-care/ADL training;Moist Heat;Fluidtherapy;DME and/or AE instruction;Splinting;Patient/family education;Balance Financial planner;Contrast Bath;Ultrasound;Therapeutic exercise;Therapeutic activities;Cognitive remediation/compensation;Passive range of motion;Functional Mobility Training;Neuromuscular education;Cryotherapy;Electrical Stimulation;Parrafin;Energy conservation;Manual Therapy;Visual/perceptual remediation/compensation   Plan continue checking STGs, neuro re-ed   OT Home Exercise Plan 09/07/16:  resting splint wear/care   Consulted and Agree with Plan of Care Patient;Family member/caregiver   Family Member Consulted husband      Patient will benefit from skilled therapeutic intervention in order to improve the following deficits and impairments:  Abnormal gait, Decreased coordination, Decreased range of motion, Difficulty walking, Impaired flexibility, Impaired sensation, Increased edema, Decreased safety awareness, Decreased endurance, Decreased activity tolerance, Decreased knowledge of precautions, Increased fascial restricitons, Impaired tone, Pain, Impaired UE functional use, Decreased knowledge of use of DME, Decreased balance, Decreased cognition, Decreased mobility, Decreased strength, Impaired perceived functional ability, Impaired  vision/preception  Visit Diagnosis: Hemiplegia and hemiparesis following cerebral infarction affecting left dominant side (HCC)  Other lack of coordination  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 Left flaccid hemiplegia 06/24/2016  . Ear itching   . Poor fluid intake   . Bradycardia, drug induced   . Poor nutrition   . Muscle spasticity   . Obesity (BMI 30.0-34.9) 04/08/2016  . Hyperlipidemia 04/08/2016  .  Basal ganglia hemorrhage ([place]) 04/08/2016  . Aphasia as late effect of cerebrovascular accident   . Dysphagia as late effect of cerebrovascular disease   . Migraine with aura and without status migrainosus, not intractable   . HLD (hyperlipidemia)   . Benign essential HTN   . Gait disturbance, post-stroke   . Hemiplegia, post-stroke ([place])   . Hypokalemia   . Dysphagia, post-stroke   . Aphasia, post-stroke   . Bradycardia   . Headache, migraine   . Dysarthria, post-stroke   . Encephalopathy acute 04/02/2016  . ICH (intracerebral hemorrhage) (HCC) - R basal ganglia due to hypertensive emergency 04/01/2016  . Upper airway cough syndrome 12/06/2015  . PCP NOTES >>> 09/25/2015  . Need for hepatitis B vaccination 10/19/2014  . Insomnia 12/28/2012  . Annual physical exam 11/23/2011  . Hemorrhoids 10/13/2011  . Morbid obesity ([place]) 02/26/2011  . CHEST PAIN UNSPECIFIED 03/28/2010  . Essential hypertension 10/19/2007  . Headache(784.0) 05/12/2007    [facility] 10/06/2016, 5:56 PM  [place] [address] [place], [place], [address] Phone: [phone]   Fax:  [phone]  Name: Abigail Campos MRN: [id number] Date of Birth: 04-Apr-1971

## 2016-10-06 NOTE — Patient Instructions (Addendum)
   Active / Assistive ROM Flexion With Static Hold Above Head    LYING DOWN Clasp hands in lap WITH THUMB FACING UP. Slowly raise straight arms toward ceiling (SHOULDER HEIGHT). Try to keep shoulder blades together. Hold 10 seconds. Repeat 10 times. Do 1-2 sessions per day.  NO PAIN  Copyright  VHI. All rights reserved.  SELF ASSISTED WITH OBJECT: Shoulder Flexion / Elbow Extension (Crutch)    Place one hand on HEMI-WALKER or other assistive device. Move arm forward, straighten elbow. 10-15 reps per set, 1-2 sets per day,   Copyright  VHI. All rights reserved.  SHOULDER: Flexion On Table    Place hands on table, PLACE RIGHT HAND OVER LEFT HAND.  SLIDE ARMS FORWARD TO A STRETCH Hold 5 seconds. 10 reps per set, 1-2 sets per day, THEN REPEAT SIDE TO SIDE 10X, THEN IN CIRCLES (CLOCK-WISE AND COUNTER CLOCK-WISE).  NO PAIN.   Copyright  VHI. All rights reserved.

## 2016-10-06 NOTE — Patient Instructions (Signed)
  Please complete the assigned speech therapy homework and return it to your next session.  

## 2016-10-06 NOTE — Therapy (Addendum)
[facility] [address] [place], [place], [address] Phone: [phone]   Fax:  [phone]  Speech Language Pathology Treatment  Patient Details  Name: Abigail Campos MRN: [id number] Date of Birth: 1971/09/24 Referring Provider: Delice Lesch, M.D.  Encounter Date: 10/06/2016      End of Session - 10/06/16 1657    Visit Number 12   Number of Visits 16   Date for SLP Re-Evaluation 10/23/16   Authorization - Number of Visits 240   SLP Start Time 7035   SLP Stop Time  1446   SLP Time Calculation (min) 41 min   Activity Tolerance Patient tolerated treatment well      Past Medical History:  Diagnosis Date  . Essential hypertension 10/19/2007   03-2010: metoprolol changed to bystolic (was not feeling well on it, no specific allergy or reaction)    . Hypertension   . Stroke [facility])     Past Surgical History:  Procedure Laterality Date  . ABDOMINAL HYSTERECTOMY  11-14-07   no oophorectomy per surgical report  . CESAREAN SECTION     [id number]  . INGUINAL HERNIA REPAIR     2004    There were no vitals filed for this visit.      Subjective Assessment - 10/06/16 1415    Subjective Pt did not bring homework SLP told pt to bring it next time.   Currently in Pain? No/denies               ADULT SLP TREATMENT - 10/06/16 1416      General Information   Behavior/Cognition Alert;Cooperative;Pleasant mood     Treatment Provided   Treatment provided Cognitive-Linquistic     Cognitive-Linquistic Treatment   Treatment focused on Cognition   Skilled Treatment SLP focused on reasoning tasks today to work with attention, awareness, and reasoning skills. Pt with error on one of two puzzles without knowledge and req'd cues (min verbal) for reasoning and attention to detail with min-mod complex reasoning tasks. Pt was more efficient and more accurate as task progressed.     Assessment / Recommendations / Plan    Plan Continue with current plan of care     Progression Toward Goals   Progression toward goals Progressing toward goals            SLP Short Term Goals - 10/01/16 1534      SLP SHORT TERM GOAL #1   Title pt will name 3 deficit areas over three sessions   Time 1   Period Weeks   Status Not Met     SLP SHORT TERM GOAL #2   Title pt will demo selective attention to therapy tasks for 10 minutes in min-mod noisy environment   Status Achieved     SLP SHORT TERM GOAL #3   Title pt will complete tasks of simple cognitive linguistic organization with 90% success and rare min A   Status Achieved     SLP SHORT TERM GOAL #4   Title pt will attempt to self correct misarticulated speech in 50% of opportunities in structured speech tasks   Status Deferred  due to focus on cognitive goals          SLP Long Term Goals - 10/06/16 1657      SLP LONG TERM GOAL #1   Title pt will demo alternating attention in simple cognitive linguistic tasks with appropriate switch times, with compensations   Time 3   Period Weeks  Status On-going     SLP LONG TERM GOAL #2   Title pt will demo emergent awareness in cognitive linguistic tasks by self correcting errors in 80% of opportunities with nonverbal cues   Time 3   Period Weeks   Status On-going     SLP LONG TERM GOAL #3   Title pt will demo functional organization in mod complex linguistic tasks with modified independence   Time 3   Period Weeks   Status On-going     SLP LONG TERM GOAL #4   Title pt will demo appropriate rate of speech in 5 minutes simple to mod complex conversation with occasional nonverbal cues   Time 3   Period Weeks   Status On-going     SLP LONG TERM GOAL #5   Title pt will demo HEP for dysarthria with min A   Time 3   Period Weeks   Status On-going          Plan - 10/06/16 1657    Clinical Impression Statement Pt continues to benefit from continued skilled ST intervention with focus on higher level  cognitive goals including attention, awareness, and reasoning.   Speech Therapy Frequency 2x / week   Duration --  3 weeks   Treatment/Interventions Compensatory strategies;Patient/family education;Functional tasks;Cueing hierarchy;Cognitive reorganization;Internal/external aids;SLP instruction and feedback;Language facilitation   Potential to Achieve Goals Good   Potential Considerations Severity of impairments      Patient will benefit from skilled therapeutic intervention in order to improve the following deficits and impairments:   Cognitive social or emotional deficit following nontraumatic subarachnoid hemorrhage  Dysarthria and anarthria    Problem List Patient Active Problem List   Diagnosis Date Noted  . Left flaccid hemiplegia 06/24/2016  . Ear itching   . Poor fluid intake   . Bradycardia, drug induced   . Poor nutrition   . Muscle spasticity   . Obesity (BMI 30.0-34.9) 04/08/2016  . Hyperlipidemia 04/08/2016  . Basal ganglia hemorrhage ([place]) 04/08/2016  . Aphasia as late effect of cerebrovascular accident   . Dysphagia as late effect of cerebrovascular disease   . Migraine with aura and without status migrainosus, not intractable   . HLD (hyperlipidemia)   . Benign essential HTN   . Gait disturbance, post-stroke   . Hemiplegia, post-stroke ([place])   . Hypokalemia   . Dysphagia, post-stroke   . Aphasia, post-stroke   . Bradycardia   . Headache, migraine   . Dysarthria, post-stroke   . Encephalopathy acute 04/02/2016  . ICH (intracerebral hemorrhage) (HCC) - R basal ganglia due to hypertensive emergency 04/01/2016  . Upper airway cough syndrome 12/06/2015  . PCP NOTES >>> 09/25/2015  . Need for hepatitis B vaccination 10/19/2014  . Insomnia 12/28/2012  . Annual physical exam 11/23/2011  . Hemorrhoids 10/13/2011  . Morbid obesity ([place]) 02/26/2011  . CHEST PAIN UNSPECIFIED 03/28/2010  . Essential hypertension 10/19/2007  . Headache(784.0) 05/12/2007     SCHINKE,CARL ,[place], [place]  10/06/2016, 4:59 PM  [facility] [address] [place], [place], [address] Phone: [phone]   Fax:  [phone]   Name: Abigail Campos MRN: [id number] Date of Birth: [birth year]

## 2016-10-07 ENCOUNTER — Encounter: Payer: Self-pay | Admitting: Physical Medicine & Rehabilitation

## 2016-10-07 ENCOUNTER — Ambulatory Visit
Admission: RE | Admit: 2016-10-07 | Discharge: 2016-10-07 | Disposition: A | Discharge status: Home / Self Care | Payer: Managed Care, Other (non HMO) | Source: Ambulatory Visit | Attending: Neurology | Admitting: Neurology

## 2016-10-07 ENCOUNTER — Encounter
Payer: Managed Care, Other (non HMO) | Attending: Physical Medicine & Rehabilitation | Admitting: Physical Medicine & Rehabilitation

## 2016-10-07 VITALS — BP 123/83 | HR 61 | Resp 14

## 2016-10-07 DIAGNOSIS — G43909 Migraine, unspecified, not intractable, without status migrainosus: Secondary | ICD-10-CM | POA: Diagnosis present

## 2016-10-07 DIAGNOSIS — G8114 Spastic hemiplegia affecting left nondominant side: Secondary | ICD-10-CM | POA: Diagnosis not present

## 2016-10-07 DIAGNOSIS — Z823 Family history of stroke: Secondary | ICD-10-CM | POA: Insufficient documentation

## 2016-10-07 DIAGNOSIS — R200 Anesthesia of skin: Secondary | ICD-10-CM | POA: Diagnosis not present

## 2016-10-07 DIAGNOSIS — I619 Nontraumatic intracerebral hemorrhage, unspecified: Secondary | ICD-10-CM

## 2016-10-07 DIAGNOSIS — R630 Anorexia: Secondary | ICD-10-CM | POA: Insufficient documentation

## 2016-10-07 DIAGNOSIS — Z833 Family history of diabetes mellitus: Secondary | ICD-10-CM | POA: Insufficient documentation

## 2016-10-07 DIAGNOSIS — Z8249 Family history of ischemic heart disease and other diseases of the circulatory system: Secondary | ICD-10-CM | POA: Insufficient documentation

## 2016-10-07 DIAGNOSIS — G8191 Hemiplegia, unspecified affecting right dominant side: Secondary | ICD-10-CM | POA: Insufficient documentation

## 2016-10-07 DIAGNOSIS — I10 Essential (primary) hypertension: Secondary | ICD-10-CM

## 2016-10-07 DIAGNOSIS — I69398 Other sequelae of cerebral infarction: Secondary | ICD-10-CM

## 2016-10-07 DIAGNOSIS — R42 Dizziness and giddiness: Secondary | ICD-10-CM | POA: Insufficient documentation

## 2016-10-07 DIAGNOSIS — I610 Nontraumatic intracerebral hemorrhage in hemisphere, subcortical: Secondary | ICD-10-CM

## 2016-10-07 DIAGNOSIS — F419 Anxiety disorder, unspecified: Secondary | ICD-10-CM | POA: Insufficient documentation

## 2016-10-07 DIAGNOSIS — R269 Unspecified abnormalities of gait and mobility: Secondary | ICD-10-CM

## 2016-10-07 DIAGNOSIS — I69959 Hemiplegia and hemiparesis following unspecified cerebrovascular disease affecting unspecified side: Secondary | ICD-10-CM | POA: Diagnosis not present

## 2016-10-07 DIAGNOSIS — R252 Cramp and spasm: Secondary | ICD-10-CM | POA: Diagnosis not present

## 2016-10-07 DIAGNOSIS — F329 Major depressive disorder, single episode, unspecified: Secondary | ICD-10-CM

## 2016-10-07 DIAGNOSIS — I69354 Hemiplegia and hemiparesis following cerebral infarction affecting left non-dominant side: Secondary | ICD-10-CM

## 2016-10-07 DIAGNOSIS — R0989 Other specified symptoms and signs involving the circulatory and respiratory systems: Secondary | ICD-10-CM | POA: Diagnosis not present

## 2016-10-07 IMAGING — CT CT ANGIO HEAD
1 of 9 series · 11 of 47 positions shown · IV contrast (isovue)
Comparison: [DATE].

CLINICAL DATA: Follow-up basal ganglia hemorrhage, hypertensive
related. The original insult occurred [DATE].

EXAM:
CT ANGIOGRAPHY HEAD
TECHNIQUE: Multidetector CT imaging of the head was performed using the
standard protocol during bolus administration of intravenous
contrast. Multiplanar CT image reconstructions and MIPs were
obtained to evaluate the vascular anatomy.
CONTRAST:  Isovue 370, 100 mL.

[Series 605: axial thin · axial · 0.49mm/px · z∈[+255,+371]mm · 11 of 140 slices shown]
[im 12/140  brain]
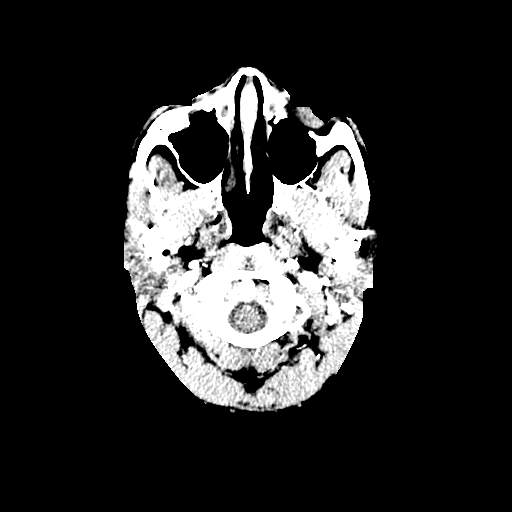
[im 24/140  bone]
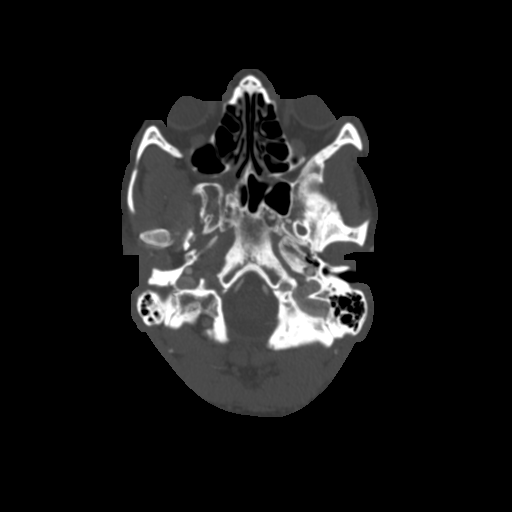
[im 35/140  brain]
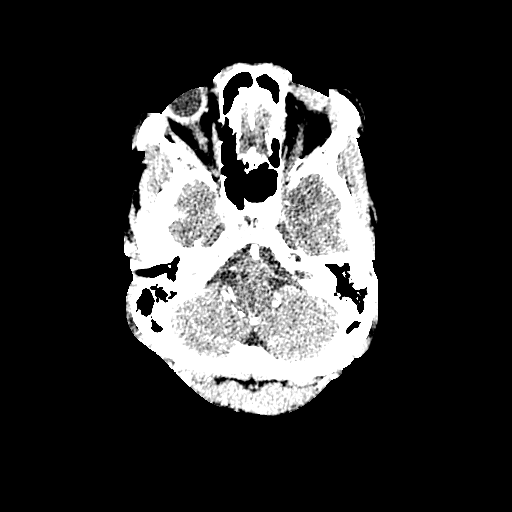
[im 47/140  bone]
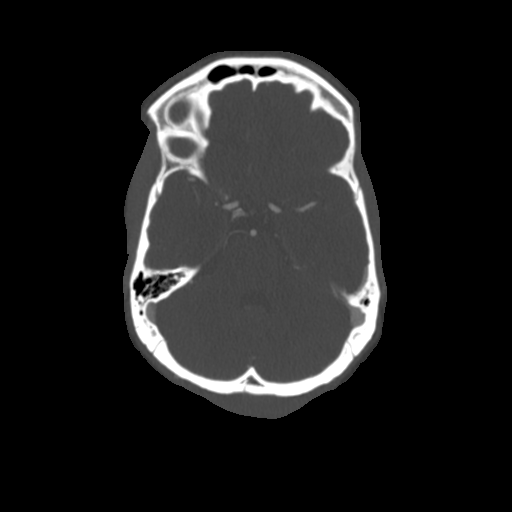
[im 58/140  brain]
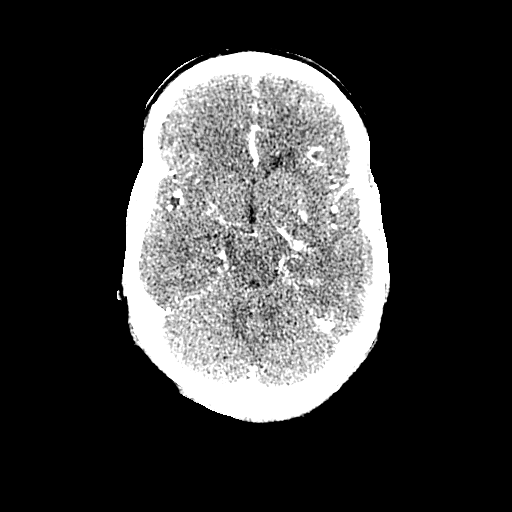
[im 70/140  bone]
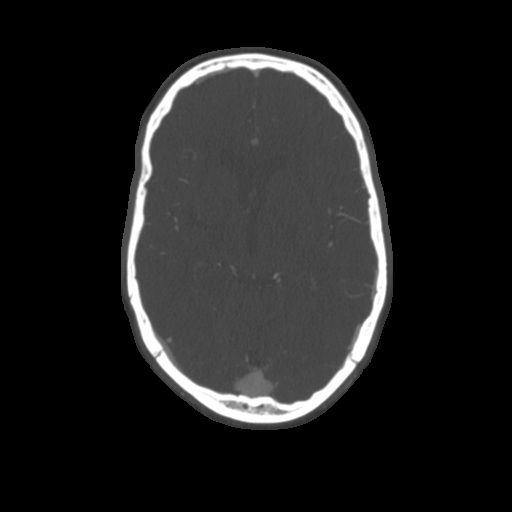
[im 82/140  brain]
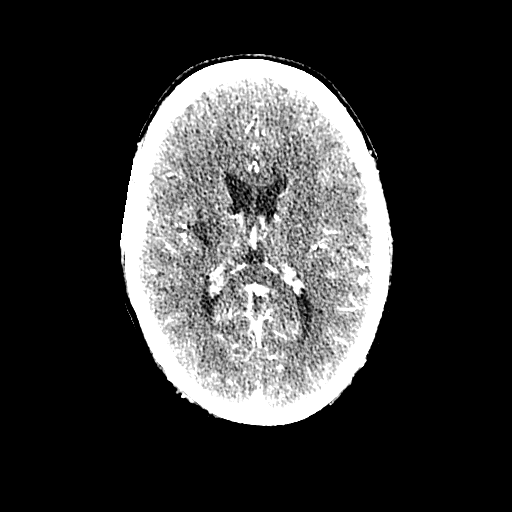
[im 93/140  bone]
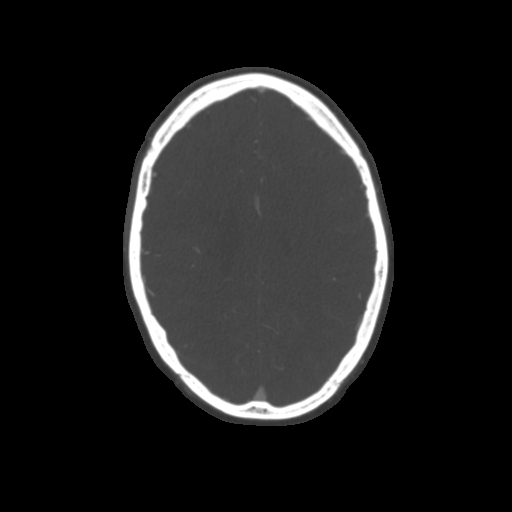
[im 105/140  brain]
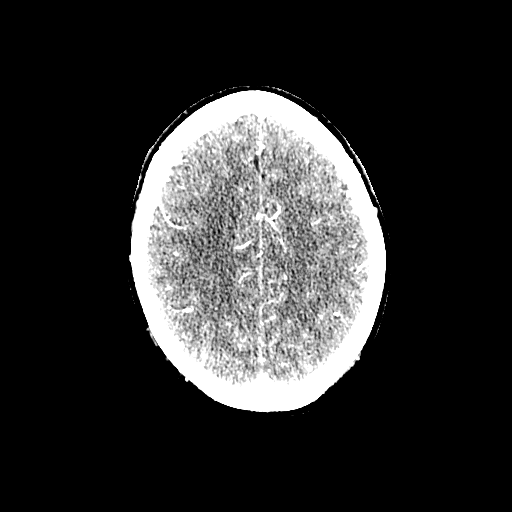
[im 116/140  bone]
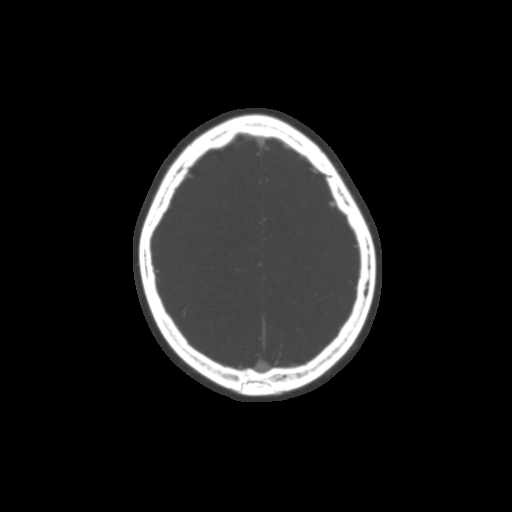
[im 128/140  brain]
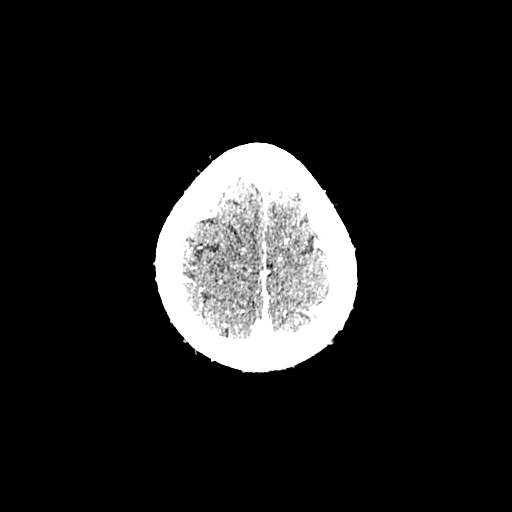

[11 of 47 positions shown; findings below may reference images not displayed]

FINDINGS: CT HEAD

Brain: No evidence for acute infarction, hemorrhage, mass lesion,
hydrocephalus, or extra-axial fluid. Mild atrophy. Encephalomalacia
related to the previous large hemorrhage.

Vascular: No hyperdense vessel or unexpected calcification.

Skull: Normal. Negative for fracture or focal lesion.

Sinuses: Imaged portions are clear.

Orbits: No acute finding.

CTA HEAD

Anterior circulation: No significant stenosis, proximal occlusion,
aneurysm, or vascular malformation.

Posterior circulation: No significant stenosis, proximal occlusion,
aneurysm, or vascular malformation.

Venous sinuses: As permitted by contrast timing, patent.

Anatomic variants: None of significance.

Delayed phase: Mild enhancement within the area of encephalomalacia
is consistent with granulation tissue, related to the reparative
process.
IMPRESSION: No underlying vasculopathy, atherosclerosis, aneurysm, or vascular
malformation is seen in this patient 6 months following RIGHT basal
ganglia hemorrhage.

Mild postcontrast enhancement within the area of encephalomalacia is
consistent with granulation tissue, related to the reparative
process.

## 2016-10-07 MED ORDER — IOPAMIDOL (ISOVUE-370) INJECTION 76%
100.0000 mL | Freq: Once | INTRAVENOUS | Status: AC | PRN
  Administered 2016-10-07: 100 mL via INTRAVENOUS

## 2016-10-07 MED ORDER — FLUOXETINE HCL 20 MG PO CAPS: 20.0000 mg | ORAL_CAPSULE | Freq: Every day | ORAL | 3 refills | Status: DC

## 2016-10-07 NOTE — Progress Notes (Addendum)
Subjective:    Patient ID: Abigail Campos, female    DOB: 02/14/1971, 45 y.o.   MRN: [id number]  HPI:  45 year old right-handed female with history of hypertension, migraine headaches presents for follow up after right basal ganglia hemorrhage.   Last clinic visit 08/13/16.  She recently saw Neurology and has a CT planned for today.  Since her last visit, She continues to wear braces.  She requests starting her fluoxetine again.  She continues to take BP meds.  Her baclofen was increased on last visit, she does not wish to make changes at present.  Overall, she is doing well and pleased with ability to walk in therapies.  Pain Inventory Average Pain 0 Pain Right Now 0 My pain is no pain  In the last 24 hours, has pain interfered with the following? General activity 0 Relation with others 0 Enjoyment of life 0 What TIME of day is your pain at its worst? no pain Sleep (in general) Fair  Pain is worse with: some activites Pain improves with: heat/ice and medication Relief from Meds: 5  Mobility walk with assistance use a walker use a wheelchair transfers alone Do you have any goals in this area?  yes  Function not employed: date last employed April 19,2017 disabled: date disabled April 19,2017 I need assistance with the following:  dressing, meal prep, household duties and shopping  Neuro/Psych spasms dizziness  Prior Studies Any changes since last visit?  no  Physicians involved in your care Any changes since last visit?  yes Neurologist Dr. Erlinda Hong   Family History  Problem Relation Age of Onset  . Diabetes Mother   . Hypertension Mother   . Glaucoma Mother   . Colon cancer Mother     mother age 27  . Stroke Paternal Grandfather   . Heart disease      2 uncles died Mar 27, 2011 from CAD-CHF  . Stroke Maternal Grandfather   . Diabetes Other     siblings  . Hypertension Other     siblings   Social History   Social History  . Marital status: Married    Spouse  name: N/A  . Number of children: 2  . Years of education: N/A   Occupational History  . [employer]   Social History Main Topics  . Smoking status: Never Smoker  . Smokeless tobacco: Never Used  . Alcohol use No  . Drug use: No  . Sexual activity: Not Asked   Other Topics Concern  . None   Social History Narrative   Used to leve independently, stroke 03-2016, d/c from rehab to his mother's house 04-2016   Past Surgical History:  Procedure Laterality Date  . ABDOMINAL HYSTERECTOMY  11-14-07   no oophorectomy per surgical report  . CESAREAN SECTION     [id number]  . INGUINAL HERNIA REPAIR     March 27, 2003   Past Medical History:  Diagnosis Date  . Essential hypertension 10/19/2007   03-2010: metoprolol changed to bystolic (was not feeling well on it, no specific allergy or reaction)    . Hypertension   . Stroke ([place])    BP 123/83 (BP Location: Right Arm, Patient Position: Sitting, Cuff Size: Large)   Pulse 61   Resp 14   SpO2 98%   Opioid Risk Score:   Fall Risk Score:  `1  Depression screen PHQ 2/9  Depression screen [facility] 2/9 08/13/2016 06/10/2016 05/15/2016 09/25/2015 07/17/2015  Decreased Interest 0 0 0 0 0  Down,  Depressed, Hopeless 0 0 1 0 0  PHQ - 2 Score 0 0 1 0 0  Altered sleeping - - 1 - -  Tired, decreased energy - - 3 - -  Change in appetite - - 0 - -  Feeling bad or failure about yourself  - - 0 - -  Trouble concentrating - - 0 - -  Moving slowly or fidgety/restless - - 0 - -  Suicidal thoughts - - 0 - -  PHQ-9 Score - - 5 - -    Review of Systems  Skin: Positive for rash.  Neurological: Positive for facial asymmetry, speech difficulty and weakness. Negative for numbness.  All other systems reviewed and are negative.     Objective:     Physical Exam Constitutional: She appears well-developed.  NAD HENT:  Normocephalic and atraumatic.   Eyes: Conjunctivae and EOM are normal.     Cardiovascular: Normal rate and regular rhythm.    Respiratory:  Effort normal and breath sounds normal. No respiratory distress.   GI: Soft. Bowel sounds are normal. She exhibits no distension.  Musculoskeletal: She exhibits no edema or no tenderness.  Neurological: She is Alert and oriented 3.  Expressive difficulties with increased speed, greatly improved Dysarthria basically resolved. Left facial weakness Motor: RUE/RLE: 5/5 proximal to distal.   LLE: Hip flexion 2+/5, knee extension 3/5 Left upper extremity: 0/5.  Modified [place] 1+/4 elbow flexors, 3/4 left finger flexors, 2/4 wrist flexors, 3/4 ankle plantar flexors.  Skin: Skin is warm and dry.  Psychiatric: Smiling, cooperative    Assessment & Plan:  45 year old right-handed female with history of hypertension, migraine headaches presents for follow up after right basal ganglia hemorrhage.    1. Hemiplegia late effect of right basal ganglia hemorrhage  Cont therapies  Cont meds  Cont Follow up Neurology, CT planned for today  Cont braces, hand-wrist AFO, PRAFO  2. Migraines  Resolved  3. Poor appetite  Resolved  4. HTN: Improved  Currently WNL  Cont meds, in light of left ventricular hypertrophy  5. Spastic hemiplegia affecting left non-dominant side  Had lengthy discussion with patient regarding treatment options for spasticity  Cont baclofen 10mg  TID (pt does not wish to increase due to side effects), no refills needed today  Pt does not wish to pursue Botox injection at this time, encouraged pt to call office if she wishes to pursue  6. Depression  Fluoxetine 20 ordered

## 2016-10-08 ENCOUNTER — Ambulatory Visit: Payer: Managed Care, Other (non HMO)

## 2016-10-08 ENCOUNTER — Ambulatory Visit: Payer: Managed Care, Other (non HMO) | Admitting: Occupational Therapy

## 2016-10-08 ENCOUNTER — Ambulatory Visit: Payer: Managed Care, Other (non HMO) | Admitting: *Deleted

## 2016-10-08 DIAGNOSIS — R278 Other lack of coordination: Secondary | ICD-10-CM

## 2016-10-08 DIAGNOSIS — R2689 Other abnormalities of gait and mobility: Secondary | ICD-10-CM

## 2016-10-08 DIAGNOSIS — I69352 Hemiplegia and hemiparesis following cerebral infarction affecting left dominant side: Secondary | ICD-10-CM

## 2016-10-08 DIAGNOSIS — I69015 Cognitive social or emotional deficit following nontraumatic subarachnoid hemorrhage: Secondary | ICD-10-CM

## 2016-10-08 NOTE — Therapy (Addendum)
[facility] [address] [place], [place], [address] Phone: [phone]   Fax:  [phone]  Speech Language Pathology Treatment  Patient Details  Name: Abigail Campos MRN: [id number] Date of Birth: 10-11-1971 Referring Provider: Delice Lesch, M.D.  Encounter Date: 10/08/2016      End of Session - 10/08/16 1638    Visit Number 13   Number of Visits 16   Date for SLP Re-Evaluation 10/23/16   Authorization - Number of Visits 240   SLP Start Time 3361   SLP Stop Time  2244   SLP Time Calculation (min) 42 min   Activity Tolerance Patient tolerated treatment well      Past Medical History:  Diagnosis Date  . Essential hypertension 10/19/2007   03-2010: metoprolol changed to bystolic (was not feeling well on it, no specific allergy or reaction)    . Hypertension   . Stroke [facility])     Past Surgical History:  Procedure Laterality Date  . ABDOMINAL HYSTERECTOMY  11-14-07   no oophorectomy per surgical report  . CESAREAN SECTION     [id number]  . INGUINAL HERNIA REPAIR     2004    There were no vitals filed for this visit.      Subjective Assessment - 10/08/16 1450    Subjective "I am still having difficulty with my attention and memory"   Pain Score 0-No pain               ADULT SLP TREATMENT - 10/08/16 0001      General Information   Behavior/Cognition Alert;Cooperative;Pleasant mood     Treatment Provided   Treatment provided Cognitive-Linquistic     Pain Assessment   Pain Assessment No/denies pain     Cognitive-Linquistic Treatment   Treatment focused on Cognition   Skilled Treatment SLP focused on reasoning tasks today to work with attention, awareness, and reasoning skills. Pt with increased time and min A for deductive reasoning puzzle (min complex) within a minimally distracting environment without error; mod complex puzzle elicited more errors and min-mod A; gave remainder for  homework; pt forgot previous homework, but stated she has significant difficulty with remembering certain things without environmental cues such as alarms, sticky notes, etc. Increased awareness of deficits noted today per pt report     Assessment / Recommendations / [place] with current plan of care     Progression Toward Goals   Progression toward goals Progressing toward goals            SLP Short Term Goals - 10/08/16 1640      SLP SHORT TERM GOAL #1   Title pt will name 3 deficit areas over three sessions   Time 1   Period Weeks   Status Not Met     SLP SHORT TERM GOAL #2   Title pt will demo selective attention to therapy tasks for 10 minutes in min-mod noisy environment   Status Achieved     SLP [place] #3   Title pt will complete tasks of simple cognitive linguistic organization with 90% success and rare min A   Status Achieved     SLP SHORT TERM GOAL #4   Title pt will attempt to self correct misarticulated speech in 50% of opportunities in structured speech tasks   Status Deferred  due to focus on cognitive goals          SLP Long Term Goals - 10/08/16 1640  SLP LONG TERM GOAL #1   Title pt will demo alternating attention in simple cognitive linguistic tasks with appropriate switch times, with compensations   Time 3   Period Weeks   Status On-going     SLP LONG TERM GOAL #2   Title pt will demo emergent awareness in cognitive linguistic tasks by self correcting errors in 80% of opportunities with nonverbal cues   Time 3   Period Weeks   Status On-going     SLP LONG TERM GOAL #3   Title pt will demo functional organization in mod complex linguistic tasks with modified independence   Time 3   Period Weeks   Status On-going     SLP LONG TERM GOAL #4   Title pt will demo appropriate rate of speech in 5 minutes simple to mod complex conversation with occasional nonverbal cues   Time 3   Period Weeks   Status On-going      SLP LONG TERM GOAL #5   Title pt will demo HEP for dysarthria with min A   Time 3   Period Weeks   Status On-going          Plan - 10/08/16 1639    Clinical Impression Statement Pt continues to benefit from continued skilled ST intervention with focus on higher level cognitive goals including attention, awareness, and reasoning.   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 Left flaccid hemiplegia 06/24/2016  . Ear itching   . Poor fluid intake   . Bradycardia, drug induced   . Poor nutrition   . Muscle spasticity   . Obesity (BMI 30.0-34.9) 04/08/2016  . Hyperlipidemia 04/08/2016  . Basal ganglia hemorrhage ([place]) 04/08/2016  . Aphasia as late effect of cerebrovascular accident   . Dysphagia as late effect of cerebrovascular disease   . Migraine with aura and without status migrainosus, not intractable   . HLD (hyperlipidemia)   . Benign essential HTN   . Gait disturbance, post-stroke   . Hemiplegia, post-stroke ([place])   . Hypokalemia   . Dysphagia, post-stroke   . Aphasia, post-stroke   . Bradycardia   . Headache, migraine   . Dysarthria, post-stroke   . Encephalopathy acute 04/02/2016  . ICH (intracerebral hemorrhage) (HCC) - R basal ganglia due to hypertensive emergency 04/01/2016  . Upper airway cough syndrome 12/06/2015  . PCP NOTES >>> 09/25/2015  . Need for hepatitis B  vaccination 10/19/2014  . Insomnia 12/28/2012  . Annual physical exam 11/23/2011  . Hemorrhoids 10/13/2011  . Morbid obesity ([place]) 02/26/2011  . CHEST PAIN UNSPECIFIED 03/28/2010  . Essential hypertension 10/19/2007  . Headache(784.0) 05/12/2007    ADAMS,PAT, M.S., CCC-SLP 10/08/2016, 4:41 PM  [facility] [address] [place], [place], [address] Phone: [phone]   Fax:  [phone]   Name: Abigail Campos MRN: [id number] Date of Birth: 05-16-1971

## 2016-10-08 NOTE — Therapy (Signed)
[facility] [address] [place], [place], [address] Phone: [phone]   Fax:  [phone]  Physical Therapy Treatment  Patient Details  Name: Abigail Campos MRN: [id number] Date of Birth: 1971/03/21 Referring Provider: Dr. Posey Pronto  Encounter Date: 10/08/2016      PT End of Session - 10/08/16 1442    Visit Number 14   Number of Visits 25   Date for PT Re-Evaluation 11/09/16  original POC: 10/10/16   Authorization Type [employer]-240 visit limit (including HH and OP). Per Lattie Haw, pt used 24 total HHPT, [place], and [place] visits.   PT Start Time 1317   PT Stop Time 1403   PT Time Calculation (min) 46 min   Equipment Utilized During Treatment --  min A-S   Activity Tolerance Patient tolerated treatment well   Behavior During Therapy WFL for tasks assessed/performed      Past Medical History:  Diagnosis Date  . Essential hypertension 10/19/2007   03-2010: metoprolol changed to bystolic (was not feeling well on it, no specific allergy or reaction)    . Hypertension   . Stroke [facility])     Past Surgical History:  Procedure Laterality Date  . ABDOMINAL HYSTERECTOMY  11-14-07   no oophorectomy per surgical report  . CESAREAN SECTION     [id number]  . INGUINAL HERNIA REPAIR     2004    There were no vitals filed for this visit.      Subjective Assessment - 10/08/16 1320    Subjective Pt denied falls since last visit. Pt reported she saw MD yesterday and was placed back on Prozac. Pt reported she had a head CT yesterday and does not have the results yet.  Pt was allergic to the dye for the CT yesterday and it caused hives and N/V.   Pertinent History HTN, hyperlipidemia, hx of migraine   Patient Stated Goals I want to walk and stand up without falling.    Currently in Pain? No/denies                         [facility] Adult PT Treatment/Exercise - 10/08/16 1332      Transfers   Transfers Stand Pivot  Transfers   Stand Pivot Transfers 6: Modified independent (Device/Increase time)   Stand Pivot Transfer Details (indicate cue type and reason) Pt performed w/c<>mat txfs and demo'd proper and safe technique, no LOB.      Standardized Balance Assessment   Standardized Balance Assessment Berg Balance Test     Berg Balance Test   Sit to Stand Able to stand without using hands and stabilize independently   Standing Unsupported Able to stand 2 minutes with supervision   Sitting with Back Unsupported but Feet Supported on Floor or Stool Able to sit safely and securely 2 minutes   Stand to Sit Sits safely with minimal use of hands   Transfers Able to transfer safely, definite need of hands   Standing Unsupported with Eyes Closed Able to stand 3 seconds   Standing Ubsupported with Feet Together Needs help to attain position but able to stand for 30 seconds with feet together   From Standing, Reach Forward with Outstretched Arm Can reach forward >12 cm safely (5")   From Standing Position, Pick up Object from Floor Able to pick up shoe, needs supervision   From Standing Position, Turn to Look Behind Over each Shoulder Looks behind one side only/other side shows less weight  shift   Turn 360 Degrees Needs assistance while turning   Standing Unsupported, Alternately Place Feet on Step/Stool Needs assistance to keep from falling or unable to try   Standing Unsupported, One Foot in [place] to take small step independently and hold 30 seconds   Standing on One Leg Tries to lift leg/unable to hold 3 seconds but remains standing independently   Total Score 33           Orthotic fit training and self care:     PT Education - 10/08/16 1438    Education provided Yes   Education Details PT and orthotist [name] present from [place]) educated pt on the importance of keeping shoe laces tight to prevent foot from coming out of L AFO but not too tight to ensure proper circulation. Orthotist suggested pt  go to [facility] to have a lower strap placed on L AFO to prevent foot from coming out of AFO. PT educated on goal progress and renewing for 4 additional weeks.    Person(s) Educated Patient   Methods Explanation;Verbal cues   Comprehension Verbalized understanding          PT Short Term Goals - 09/17/16 1641      PT SHORT TERM GOAL #1   Title Pt will be IND in performing HEP to improve strength and balance. TARGET DATE FOR ALL STGS: 09/08/16   Status Partially Met     PT SHORT TERM GOAL #2   Title Perform BERG and write goals if appropriate.   Status Achieved     PT SHORT TERM GOAL #3   Title Pt will amb. 200' over even terrain with LRAD at MOD I level to improve functional mobility.    Status Partially Met     PT SHORT TERM GOAL #4   Title Pt will improve gait speed to 0.33f/sec with LRAD to decrease falls risk.    Status Not Met     PT SHORT TERM GOAL #5   Title Pt will improve BERG score to >/=21/56 to decr. falls risk.    Status Achieved           PT Long Term Goals - 09/09/16 1501      PT LONG TERM GOAL #1   Title Pt will verbalize understanding or CVA risk factors and signs/symptoms to reduce risk of additional CVA. TARGET DATE FOR ALL LTGS: 10/06/16   Status On-going     PT LONG TERM GOAL #2   Title Pt will improve gait speed to >/=1.855fsec. with LRAD to reduce falls risk.    Status On-going     PT LONG TERM GOAL #3   Title Pt will amb. 400' over even/paved surfaces with LRAD at MOD I level to improve functional mobility.    Status On-going     PT LONG TERM GOAL #4   Title Pt will perform TUG with LRAD in </=30sec. to improve function and decr. falls risk.    Status On-going     PT LONG TERM GOAL #5   Title Pt will perform stand pivot txfs from chair to w/c at MOD I level to improve functional mobility.    Status On-going     PT LONG TERM GOAL #6   Title Pt will improve BERG score to >/=31/56 to decr. falls risk.    Baseline 27/56 on 09/07/16,  therefore, PT revised long term BERG goal from 25/56 to 31/56   Status On-going  Plan - 10/08/16 1443    Clinical Impression Statement Pt demonstrated progress as she met LTGs 1, 5 and 6. Last 15 minutes of session focused on proper L AFO fitting with orthotist. PT will finish assessing LTGs next session and send renewal to MD next session with all goals included. PT will request 2x/week for add'l 4 weeks.    Rehab Potential Good   Clinical Impairments Affecting Rehab Potential co-morbidities   PT Frequency 2x / week   PT Duration 8 weeks   PT Treatment/Interventions ADLs/Self Care Home Management;Biofeedback;Canalith Repostioning;Electrical Stimulation;Cognitive remediation;Neuromuscular re-education;Balance training;Therapeutic exercise;Therapeutic activities;Functional mobility training;Stair training;Gait training;DME Instruction;Orthotic Fit/Training;Patient/family education;Vestibular;Manual techniques   PT Next Visit Plan Finish checking LTGs and renew.   PT Home Exercise Plan Strengthening HEP.    Consulted and Agree with Plan of Care Patient;Family member/caregiver   Family Member Consulted pt's mom: [name]      Patient will benefit from skilled therapeutic intervention in order to improve the following deficits and impairments:  Abnormal gait, Decreased endurance, Decreased knowledge of use of DME, Decreased strength, Impaired UE functional use, Impaired sensation, Decreased balance, Decreased mobility, Decreased range of motion, Decreased coordination, Impaired flexibility, Decreased cognition  Visit Diagnosis: Hemiplegia and hemiparesis following cerebral infarction affecting left dominant side (HCC)  Other abnormalities of gait and mobility  Other lack of coordination     Problem List Patient Active Problem List   Diagnosis Date Noted  . Left flaccid hemiplegia 06/24/2016  . Ear itching   . Poor fluid intake   . Bradycardia, drug induced   . Poor  nutrition   . Muscle spasticity   . Obesity (BMI 30.0-34.9) 04/08/2016  . Hyperlipidemia 04/08/2016  . Basal ganglia hemorrhage ([place]) 04/08/2016  . Aphasia as late effect of cerebrovascular accident   . Dysphagia as late effect of cerebrovascular disease   . Migraine with aura and without status migrainosus, not intractable   . HLD (hyperlipidemia)   . Benign essential HTN   . Gait disturbance, post-stroke   . Hemiplegia, post-stroke ([place])   . Hypokalemia   . Dysphagia, post-stroke   . Aphasia, post-stroke   . Bradycardia   . Headache, migraine   . Dysarthria, post-stroke   . Encephalopathy acute 04/02/2016  . ICH (intracerebral hemorrhage) (HCC) - R basal ganglia due to hypertensive emergency 04/01/2016  . Upper airway cough syndrome 12/06/2015  . PCP NOTES >>> 09/25/2015  . Need for hepatitis B vaccination 10/19/2014  . Insomnia 12/28/2012  . Annual physical exam 11/23/2011  . Hemorrhoids 10/13/2011  . Morbid obesity ([place]) 02/26/2011  . CHEST PAIN UNSPECIFIED 03/28/2010  . Essential hypertension 10/19/2007  . Headache(784.0) 05/12/2007    Baleria Wyman L 10/08/2016, 4:48 PM  [facility] [address] [place], [place], [address] Phone: [phone]   Fax:  [phone]  Name: RAIA AMICO MRN: [id number] Date of Birth: 14-Feb-1971   Geoffry Paradise, PT,DPT 10/08/16 5:01 PM Phone: [phone] Fax: [phone]

## 2016-10-08 NOTE — Therapy (Signed)
[facility] [address] [place], [place], [address] Phone: [phone]   Fax:  [phone]  Occupational Therapy Treatment  Patient Details  Name: Abigail Campos MRN: [id number] Date of Birth: 1971/03/15 Referring Provider: Dr. Posey Pronto  Encounter Date: 10/08/2016      OT End of Session - 10/08/16 1700    Visit Number 11   Number of Visits 25   Date for OT Re-Evaluation 12/09/16   Authorization Type Aetna (240 visit limit combined with home health, estimated [place] used 24 combined), no auth required    Authorization Time Period wk 6/12   OT Start Time 1406   OT Stop Time 1445   OT Time Calculation (min) 39 min   Activity Tolerance Patient tolerated treatment well   Behavior During Therapy [facility] for tasks assessed/performed      Past Medical History:  Diagnosis Date  . Essential hypertension 10/19/2007   03-2010: metoprolol changed to bystolic (was not feeling well on it, no specific allergy or reaction)    . Hypertension   . Stroke [facility])     Past Surgical History:  Procedure Laterality Date  . ABDOMINAL HYSTERECTOMY  11-14-07   no oophorectomy per surgical report  . CESAREAN SECTION     [id number]  . INGUINAL HERNIA REPAIR     2004    There were no vitals filed for this visit.      Subjective Assessment - 10/08/16 1650    Subjective  "I'm so excited"   Pertinent History see Epic   Patient Stated Goals to be able to use left arm   Currently in Pain? No/denies          In supine,  AAROM ER/IR, elbow flex/ext with pt attempting to initiate--pt performed with mod-max facilitation initially, then progressed to min facilitation.  In sitting, AROM wrist ext with min facilitation initially, then min cues for avoiding compensation.  Light wt. Bearing through L hand in abduction with lateral wt. Shifts/body on arm movements with min-mod cues, min facilitation.    Wt. Bearing through L elbow for lateral  wt. Shift with mod facilitation for control and for push to sitting.  Facilitation for decr tone/stretch L hand followed by attempts for AROM/AAROM finger flex/ext.  Pt inconsistently able to initiate finger flex.                        OT Short Term Goals - 10/08/16 1702      OT SHORT TERM GOAL #1   Title Pt/ caregiver will be I with HEP.   Time 6   Period Weeks   Status On-going     OT SHORT TERM GOAL #2   Title Pt/ family will verbalize understanding of LUE positioning to minimize pain and risk for injury, including splinting PRN.   Time 6   Period Weeks   Status Achieved  10/01/16     OT SHORT TERM GOAL #3   Title Pt will donn shirt with min A.   Time 6   Period Weeks   Status Achieved  10/01/16  mod I     OT SHORT TERM GOAL #4   Title Pt will demonstrate 90* P/ROM shoulder flexion in LUE with pain less than or equal to 3/10.   Time 6   Period Weeks   Status Achieved  10/06/16 met to 65* with no pain     OT SHORT TERM GOAL #5   Title Pt  will perform simple snack prep modified independently.   Time 6   Period Weeks   Status On-going  10/08/16  not performing as she primarily uses hemi-walker and can't carry anything in LUE.     OT SHORT TERM GOAL #6   Title Pt will perform bathing at a supervision level   Time 6   Period Weeks   Status Achieved  10/01/16 met per pt report           OT Long Term Goals - 10/01/16 1653      OT LONG TERM GOAL #1   Title Pt will perform LB dressing with min A.   Time 12   Period Weeks   Status Achieved  10/01/16:  needs A for L shoe/brace     OT LONG TERM GOAL #2   Title Pt will use LUE as a stabilizer/ gross A with min A.   Time 12   Period Weeks   Status New     OT LONG TERM GOAL #3   Title Pt will perform simple home management tasks with min A.   Time 6   Period Weeks   Status New     OT LONG TERM GOAL #4   Title Pt will demonstrate adequate standing balance to retrieve a light weight  object from overhead cabinet modified independently with RUE without LOB in prep for IADLS.   Time 12   Period Weeks   Status New     OT LONG TERM GOAL #5   Title Pt will perform shower transfers modified independently   Time 12   Period Weeks   Status New               Plan - 10/08/16 1701    Clinical Impression Statement Pt demo new AAROM/AROM today with elbow flex/ext, shoulder IR/ER, and wrist ex.  Pt is progressing well.   Rehab Potential Good   Clinical Impairments Affecting Rehab Potential severity of deficits   OT Frequency 2x / week   OT Duration 12 weeks   OT Treatment/Interventions Self-care/ADL training;Moist Heat;Fluidtherapy;DME and/or AE instruction;Splinting;Patient/family education;Balance Financial planner;Contrast Bath;Ultrasound;Therapeutic exercise;Therapeutic activities;Cognitive remediation/compensation;Passive range of motion;Functional Mobility Training;Neuromuscular education;Cryotherapy;Electrical Stimulation;[place]ra[place];Energy conservation;Manual Therapy;Visual/perceptual remediation/compensation   Plan neuro re-ed, progress HEP prn   OT Home Exercise Plan 09/07/16:  resting splint wear/care   Consulted and Agree with Plan of Care Patient;Family member/caregiver   Family Member Consulted husband      Patient will benefit from skilled therapeutic intervention in order to improve the following deficits and impairments:  Abnormal gait, Decreased coordination, Decreased range of motion, Difficulty walking, Impaired flexibility, Impaired sensation, Increased edema, Decreased safety awareness, Decreased endurance, Decreased activity tolerance, Decreased knowledge of precautions, Increased fascial restricitons, Impaired tone, Pain, Impaired UE functional use, Decreased knowledge of use of DME, Decreased balance, Decreased cognition, Decreased mobility, Decreased strength, Impaired perceived functional ability, Impaired vision/preception  Visit  Diagnosis: Hemiplegia and hemiparesis following cerebral infarction affecting left dominant side (HCC)  Other lack of coordination  Other abnormalities of gait and mobility    Problem List Patient Active Problem List   Diagnosis Date Noted  . Left flaccid hemiplegia 06/24/2016  . Ear itching   . Poor fluid intake   . Bradycardia, drug induced   . Poor nutrition   . Muscle spasticity   . Obesity (BMI 30.0-34.9) 04/08/2016  . Hyperlipidemia 04/08/2016  . Basal ganglia hemorrhage ([place]) 04/08/2016  . Aphasia as late effect of cerebrovascular accident   . Dysphagia as  late effect of cerebrovascular disease   . Migraine with aura and without status migrainosus, not intractable   . HLD (hyperlipidemia)   . Benign essential HTN   . Gait disturbance, post-stroke   . Hemiplegia, post-stroke ([place])   . Hypokalemia   . Dysphagia, post-stroke   . Aphasia, post-stroke   . Bradycardia   . Headache, migraine   . Dysarthria, post-stroke   . Encephalopathy acute 04/02/2016  . ICH (intracerebral hemorrhage) (HCC) - R basal ganglia due to hypertensive emergency 04/01/2016  . Upper airway cough syndrome 12/06/2015  . PCP NOTES >>> 09/25/2015  . Need for hepatitis B vaccination 10/19/2014  . Insomnia 12/28/2012  . Annual physical exam 11/23/2011  . Hemorrhoids 10/13/2011  . Morbid obesity ([place]) 02/26/2011  . CHEST PAIN UNSPECIFIED 03/28/2010  . Essential hypertension 10/19/2007  . Headache(784.0) 05/12/2007    [facility] 10/08/2016, 5:04 PM  [facility] [address] [place], [place], [address] Phone: [phone]   Fax:  [phone]  Name: Abigail Campos MRN: [id number] Date of Birth: 03/15/1971   Vianne Bulls, OTR/L [facility] [address] [place], [place]  [phone] phone [phone] 10/08/16 5:22 PM

## 2016-10-11 ENCOUNTER — Other Ambulatory Visit: Payer: Self-pay | Admitting: Internal Medicine

## 2016-10-12 ENCOUNTER — Telehealth: Payer: Self-pay

## 2016-10-12 NOTE — Telephone Encounter (Signed)
-----   Message from Rosalin Hawking, MD sent at 10/08/2016  5:10 PM EDT ----- Could you please let the patient know that the CT test done recently showed no aneurysm or vessel malformation. Her bleeding in brain is due to her BP not in control at that time. Please continue current treatment and BP control. Thanks.  Rosalin Hawking, MD PhD Stroke Neurology 10/08/2016 5:10 PM

## 2016-10-12 NOTE — Telephone Encounter (Signed)
Okay to refill Bystolic and Atorvastatin?

## 2016-10-12 NOTE — Telephone Encounter (Signed)
Okay to refill for 6 months 

## 2016-10-12 NOTE — Telephone Encounter (Signed)
LFt vm on cell phone for patient to call back about Ct angio head w/wo contrast results.

## 2016-10-12 NOTE — Telephone Encounter (Signed)
Rxs sent

## 2016-10-13 ENCOUNTER — Ambulatory Visit: Payer: Managed Care, Other (non HMO)

## 2016-10-13 ENCOUNTER — Ambulatory Visit: Payer: Managed Care, Other (non HMO) | Admitting: Occupational Therapy

## 2016-10-13 DIAGNOSIS — I69015 Cognitive social or emotional deficit following nontraumatic subarachnoid hemorrhage: Secondary | ICD-10-CM

## 2016-10-13 DIAGNOSIS — R278 Other lack of coordination: Secondary | ICD-10-CM

## 2016-10-13 DIAGNOSIS — I69352 Hemiplegia and hemiparesis following cerebral infarction affecting left dominant side: Secondary | ICD-10-CM

## 2016-10-13 DIAGNOSIS — R471 Dysarthria and anarthria: Secondary | ICD-10-CM

## 2016-10-13 DIAGNOSIS — R2689 Other abnormalities of gait and mobility: Secondary | ICD-10-CM

## 2016-10-13 NOTE — Therapy (Addendum)
[facility] [address] [place], [place], [address] Phone: [phone]   Fax:  [phone]  Speech Language Pathology Treatment  Patient Details  Name: Abigail Campos MRN: [id number] Date of Birth: 06-12-1971 Referring Provider: Delice Lesch, M.D.  Encounter Date: 10/13/2016      End of Session - 10/13/16 1707    Visit Number 14   Number of Visits 16   Date for SLP Re-Evaluation 10/23/16   SLP Start Time 1448   SLP Stop Time  5537   SLP Time Calculation (min) 42 min   Activity Tolerance Patient tolerated treatment well      Past Medical History:  Diagnosis Date  . Essential hypertension 10/19/2007   03-2010: metoprolol changed to bystolic (was not feeling well on it, no specific allergy or reaction)    . Hypertension   . Stroke [facility])     Past Surgical History:  Procedure Laterality Date  . ABDOMINAL HYSTERECTOMY  11-14-07   no oophorectomy per surgical report  . CESAREAN SECTION     [id number]  . INGUINAL HERNIA REPAIR     2004    There were no vitals filed for this visit.      Subjective Assessment - 10/13/16 1453    Subjective Pt sat in her wheelchair instead of the therapy chair.   Currently in Pain? No/denies               ADULT SLP TREATMENT - 10/13/16 1454      General Information   Behavior/Cognition Alert;Cooperative;Pleasant mood     Treatment Provided   Treatment provided Cognitive-Linquistic     Cognitive-Linquistic Treatment   Treatment focused on Cognition   Skilled Treatment Pt told SLP she had difficulty with memory after being reminded of this deficit, and "slow down when I talk", and "focusing", spontaneously.  In alternating attention with auditory comprehension and auditory attention (tracking commercials) pt with 88% and 90% success, respectively.      Assessment / Recommendations / Plan   Plan Continue with current plan of care     Progression Toward Goals    Progression toward goals Progressing toward goals            SLP Short Term Goals - 10/08/16 1640      SLP SHORT TERM GOAL #1   Title pt will name 3 deficit areas over three sessions   Time 1   Period Weeks   Status Not Met     SLP SHORT TERM GOAL #2   Title pt will demo selective attention to therapy tasks for 10 minutes in min-mod noisy environment   Status Achieved     SLP SHORT TERM GOAL #3   Title pt will complete tasks of simple cognitive linguistic organization with 90% success and rare min A   Status Achieved     SLP SHORT TERM GOAL #4   Title pt will attempt to self correct misarticulated speech in 50% of opportunities in structured speech tasks   Status Deferred  due to focus on cognitive goals          SLP Long Term Goals - 10/13/16 1708      SLP LONG TERM GOAL #1   Title pt will demo alternating attention in simple cognitive linguistic tasks with appropriate switch times, with compensations   Time 2   Period Weeks   Status On-going     SLP LONG TERM GOAL #2   Title pt will demo emergent  awareness in cognitive linguistic tasks by self correcting errors in 80% of opportunities with nonverbal cues   Time 2   Period Weeks   Status On-going     SLP LONG TERM GOAL #3   Title pt will demo functional organization in mod complex linguistic tasks with modified independence   Time 2   Period Weeks   Status On-going     SLP LONG TERM GOAL #4   Title pt will demo appropriate rate of speech in 5 minutes simple to mod complex conversation with occasional nonverbal cues   Time 2   Period Weeks   Status Deferred  due to cognitive-ling goals     SLP LONG TERM GOAL #5   Title pt will demo HEP for dysarthria with min A   Time 2   Period Weeks   Status Deferred  due to cognitive ling goals          Plan - 10/13/16 1707    Clinical Impression Statement Pt continues to benefit from continued skilled ST intervention with focus on higher level cognitive  goals including attention, awareness, and reasoning.   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 Left flaccid hemiplegia 06/24/2016  . Ear itching   . Poor fluid intake   . Bradycardia, drug induced   . Poor nutrition   . Muscle spasticity   . Obesity (BMI 30.0-34.9) 04/08/2016  . Hyperlipidemia 04/08/2016  . Basal ganglia hemorrhage ([place]) 04/08/2016  . Aphasia as late effect of cerebrovascular accident   . Dysphagia as late effect of cerebrovascular disease   . Migraine with aura and without status migrainosus, not intractable   . HLD (hyperlipidemia)   . Benign essential HTN   . Gait disturbance, post-stroke   . Hemiplegia, post-stroke ([place])   . Hypokalemia   . Dysphagia, post-stroke   . Aphasia, post-stroke   . Bradycardia   . Headache, migraine   . Dysarthria, post-stroke   . Encephalopathy acute 04/02/2016  . ICH (intracerebral hemorrhage) (HCC) - R basal ganglia due to hypertensive emergency 04/01/2016  . Upper airway cough syndrome 12/06/2015  . PCP NOTES >>> 09/25/2015  . Need for hepatitis B vaccination 10/19/2014  . Insomnia 12/28/2012  . Annual physical exam 11/23/2011  . Hemorrhoids 10/13/2011  . Morbid obesity ([place]) 02/26/2011  . CHEST PAIN UNSPECIFIED 03/28/2010  . Essential hypertension 10/19/2007  . Headache(784.0) 05/12/2007    [place] ,[place],  [place]  10/13/2016, 5:09 PM  [facility] [address] [place], [place], [address] Phone: [phone]   Fax:  [phone]   Name: Abigail Campos MRN: [id number] Date of Birth: 10/16/1971

## 2016-10-13 NOTE — Therapy (Signed)
[facility] [address] [place], [place], [address] Phone: [phone]   Fax:  [phone]  Occupational Therapy Treatment  Patient Details  Name: Abigail Campos MRN: [id number] Date of Birth: 09/09/1971 Referring Provider: Dr. Posey Pronto  Encounter Date: 10/13/2016      OT End of Session - 10/13/16 1352    Visit Number 12   Number of Visits 25   Date for OT Re-Evaluation 12/09/16   Authorization Type [employer] (240 visit limit combined with home health, estimated HH used 24 combined), no auth required    Authorization Time Period wk 7/12   OT Start Time 1315   OT Stop Time 1358   OT Time Calculation (min) 43 min   Activity Tolerance Patient tolerated treatment well      Past Medical History:  Diagnosis Date  . Essential hypertension 10/19/2007   03-2010: metoprolol changed to bystolic (was not feeling well on it, no specific allergy or reaction)    . Hypertension   . Stroke [facility])     Past Surgical History:  Procedure Laterality Date  . ABDOMINAL HYSTERECTOMY  11-14-07   no oophorectomy per surgical report  . CESAREAN SECTION     [id number]  . INGUINAL HERNIA REPAIR     2004    There were no vitals filed for this visit.      Subjective Assessment - 10/13/16 1320    Subjective  No changes   Pertinent History see Epic   Patient Stated Goals to be able to use left arm   Currently in Pain? No/denies                      OT Treatments/Exercises (OP) - 10/13/16 0001      Neurological Re-education Exercises   Other Exercises 1 Closed chain activity with LUE positioned in approx. 70* flex while actively pushing with shoulder and elbow activation requiring mod assist/facilitation   Other Exercises 2 Sidelying: AA/ROM in shoulder flex/ext and horizontal sh. flex/ext with scapula retraction with mod facilitation/assist   Other Weight-Bearing Exercises 1 Seated: wt bearing through LUE while  performing lateral trunk flexion while reaching with RUE. Also wt bearing through LUE with trunk rotation movements bilaterally     Electrical Stimulation   Electrical Stimulation Location dorsal forearm   Electrical Stimulation Action wrist and finger extension   Electrical Stimulation Parameters 50 pps, 250 pw, 10 sec. on/off cycle x 10 minutes   Electrical Stimulation Goals Neuromuscular facilitation                  OT Short Term Goals - 10/08/16 1702      OT SHORT TERM GOAL #1   Title Pt/ caregiver will be I with HEP.   Time 6   Period Weeks   Status On-going     OT SHORT TERM GOAL #2   Title Pt/ family will verbalize understanding of LUE positioning to minimize pain and risk for injury, including splinting PRN.   Time 6   Period Weeks   Status Achieved  10/01/16     OT SHORT TERM GOAL #3   Title Pt will donn shirt with min A.   Time 6   Period Weeks   Status Achieved  10/01/16  mod I     OT SHORT TERM GOAL #4   Title Pt will demonstrate 90* P/ROM shoulder flexion in LUE with pain less than or equal to 3/10.   Time 6  Period Weeks   Status Achieved  10/06/16 met to 90* with no pain     OT SHORT TERM GOAL #5   Title Pt will perform simple snack prep modified independently.   Time 6   Period Weeks   Status On-going  10/08/16  not performing as she primarily uses hemi-walker and can't carry anything in LUE.     OT SHORT TERM GOAL #6   Title Pt will perform bathing at a supervision level   Time 6   Period Weeks   Status Achieved  10/01/16 met per pt report           OT Long Term Goals - 10/01/16 1653      OT LONG TERM GOAL #1   Title Pt will perform LB dressing with min A.   Time 12   Period Weeks   Status Achieved  10/01/16:  needs A for L shoe/brace     OT LONG TERM GOAL #2   Title Pt will use LUE as a stabilizer/ gross A with min A.   Time 12   Period Weeks   Status New     OT LONG TERM GOAL #3   Title Pt will perform simple  home management tasks with min A.   Time 6   Period Weeks   Status New     OT LONG TERM GOAL #4   Title Pt will demonstrate adequate standing balance to retrieve a light weight object from overhead cabinet modified independently with RUE without LOB in prep for IADLS.   Time 12   Period Weeks   Status New     OT LONG TERM GOAL #5   Title Pt will perform shower transfers modified independently   Time 12   Period Weeks   Status New               Plan - 10/13/16 1353    Clinical Impression Statement Pt demo more activation of sh. flexion with AA/ROM in sidelying. Pt slowyly progressing LUE   Rehab Potential Good   Clinical Impairments Affecting Rehab Potential severity of deficits   OT Frequency 2x / week   OT Duration 12 weeks   OT Treatment/Interventions Self-care/ADL training;Moist Heat;Fluidtherapy;DME and/or AE instruction;Splinting;Patient/family education;Balance Financial planner;Contrast Bath;Ultrasound;Therapeutic exercise;Therapeutic activities;Cognitive remediation/compensation;Passive range of motion;Functional Mobility Training;Neuromuscular education;Cryotherapy;Electrical Stimulation;Parrafin;Energy conservation;Manual Therapy;Visual/perceptual remediation/compensation   Plan continue NMR, progress HEP prn   OT Home Exercise Plan 09/07/16:  resting splint wear/care   Consulted and Agree with Plan of Care Patient      Patient will benefit from skilled therapeutic intervention in order to improve the following deficits and impairments:  Abnormal gait, Decreased coordination, Decreased range of motion, Difficulty walking, Impaired flexibility, Impaired sensation, Increased edema, Decreased safety awareness, Decreased endurance, Decreased activity tolerance, Decreased knowledge of precautions, Increased fascial restricitons, Impaired tone, Pain, Impaired UE functional use, Decreased knowledge of use of DME, Decreased balance, Decreased cognition, Decreased  mobility, Decreased strength, Impaired perceived functional ability, Impaired vision/preception  Visit Diagnosis: Hemiplegia and hemiparesis following cerebral infarction affecting left dominant side [facility])    Problem List Patient Active Problem List   Diagnosis Date Noted  . Left flaccid hemiplegia 06/24/2016  . Ear itching   . Poor fluid intake   . Bradycardia, drug induced   . Poor nutrition   . Muscle spasticity   . Obesity (BMI 30.0-34.9) 04/08/2016  . Hyperlipidemia 04/08/2016  . Basal ganglia hemorrhage ([place]) 04/08/2016  . Aphasia as late effect of cerebrovascular accident   .  Dysphagia as late effect of cerebrovascular disease   . Migraine with aura and without status migrainosus, not intractable   . HLD (hyperlipidemia)   . Benign essential HTN   . Gait disturbance, post-stroke   . Hemiplegia, post-stroke ([place])   . Hypokalemia   . Dysphagia, post-stroke   . Aphasia, post-stroke   . Bradycardia   . Headache, migraine   . Dysarthria, post-stroke   . Encephalopathy acute 04/02/2016  . ICH (intracerebral hemorrhage) (HCC) - R basal ganglia due to hypertensive emergency 04/01/2016  . Upper airway cough syndrome 12/06/2015  . PCP NOTES >>> 09/25/2015  . Need for hepatitis B vaccination 10/19/2014  . Insomnia 12/28/2012  . Annual physical exam 11/23/2011  . Hemorrhoids 10/13/2011  . Morbid obesity ([place]) 02/26/2011  . CHEST PAIN UNSPECIFIED 03/28/2010  . Essential hypertension 10/19/2007  . Headache(784.0) 05/12/2007    Carey Bullocks, OTR/L 10/13/2016, 1:55 PM  [facility] [address] [place], [place], [address] Phone: [phone]   Fax:  [phone]  Name: CHRISTOL THETFORD MRN: [id number] Date of Birth: 09-18-1971

## 2016-10-13 NOTE — Telephone Encounter (Signed)
Rn call patient about the CT test done showed no aneurysm or vessel malformation. Her bleeding in brain was due to BP not in control at that time. Continue treatment plan. PT verbalized understanding.

## 2016-10-13 NOTE — Patient Instructions (Signed)
  Please complete the assigned speech therapy homework and return it to your next session.  

## 2016-10-14 NOTE — Therapy (Signed)
[facility] [address] [place], [place], [address] Phone: [phone]   Fax:  [phone]  Physical Therapy Treatment  Patient Details  Name: Abigail Campos MRN: [id number] Date of Birth: July 17, 1971 Referring Provider: Dr. Posey Pronto  Encounter Date: 10/13/2016      PT End of Session - 10/14/16 1157    Visit Number 15   Number of Visits 25   Date for PT Re-Evaluation 11/09/16  original 10/10/16 POC   Authorization Type [employer]-240 visit limit (including HH and OP). Per Lattie Haw, pt used 24 total HHPT, [place], and [place] visits.   PT Start Time 1403   PT Stop Time 1446   PT Time Calculation (min) 43 min   Equipment Utilized During Treatment Gait belt   Activity Tolerance Patient tolerated treatment well   Behavior During Therapy WFL for tasks assessed/performed      Past Medical History:  Diagnosis Date  . Essential hypertension 10/19/2007   03-2010: metoprolol changed to bystolic (was not feeling well on it, no specific allergy or reaction)    . Hypertension   . Stroke [facility])     Past Surgical History:  Procedure Laterality Date  . ABDOMINAL HYSTERECTOMY  11-14-07   no oophorectomy per surgical report  . CESAREAN SECTION     [id number]  . INGUINAL HERNIA REPAIR     2004    There were no vitals filed for this visit.      Subjective Assessment - 10/13/16 1404    Subjective Pt denied falls or changes since last visit. Pt reported she does not have [facility] number to call and schedule an appt. for her AFO adjustment.   Pertinent History HTN, hyperlipidemia, hx of migraine   Patient Stated Goals I want to walk and stand up without falling.    Currently in Pain? No/denies                         [facility] Adult PT Treatment/Exercise - 10/13/16 1409      Ambulation/Gait   Ambulation/Gait Yes   Ambulation/Gait Assistance 5: Supervision;4: Min guard;4: Min assist   Ambulation/Gait Assistance  Details Pt required min A during 1 LOB during amb. over paved surfaces outside. Cues to improve LLE stance time and R step length.   Ambulation Distance (Feet) 50 Feet  x4, 75', 450'   Assistive device Large base quad cane;Hemi-walker   Gait Pattern Decreased stride length;Decreased stance time - left;Decreased step length - right;Decreased dorsiflexion - left;Left circumduction;Left genu recurvatum;Step-to pattern;Step-through pattern  intermittent L genu recurvatum   Ambulation Surface Level;Unlevel;Indoor;Outdoor;Paved   Gait velocity 0.21f/sec and 0.549fsec with LBCape Cod Asc LLCnd 0.5817fec. with hemi-walker     Standardized Balance Assessment   Standardized Balance Assessment Timed Up and Go Test     Timed Up and Go Test   TUG Normal TUG   Normal TUG (seconds) 30.8  with [facility]             PT Education - 10/14/16 1157    Education provided Yes   Education Details PT discussed goal progress and renewal.   Person(s) Educated Patient   Methods Explanation   Comprehension Verbalized understanding          PT Short Term Goals - 09/17/16 1641      PT SHORT TERM GOAL #1   Title Pt will be IND in performing HEP to improve strength and balance. TARGET DATE FOR ALL STGS: 09/08/16  Status Partially Met     PT SHORT TERM GOAL #2   Title Perform BERG and write goals if appropriate.   Status Achieved     PT SHORT TERM GOAL #3   Title Pt will amb. 200' over even terrain with LRAD at MOD I level to improve functional mobility.    Status Partially Met     PT SHORT TERM GOAL #4   Title Pt will improve gait speed to 0.57ft/sec with LRAD to decrease falls risk.    Status Not Met     PT SHORT TERM GOAL #5   Title Pt will improve BERG score to >/=21/56 to decr. falls risk.    Status Achieved           PT Long Term Goals - 10/14/16 1159      PT LONG TERM GOAL #1   Title Pt will verbalize understanding or CVA risk factors and signs/symptoms to reduce risk of additional CVA.  TARGET DATE FOR ALL LTGS: 10/06/16   Baseline ALL UNMET GOALS WILL BE CARRIED OVER TO NEW POC: 11/09/16   Status Achieved     PT LONG TERM GOAL #2   Title Pt will improve gait speed to >/=1.36ft/sec. with LRAD to reduce falls risk.    Status Not Met     PT LONG TERM GOAL #3   Title Pt will amb. 400' over even/paved surfaces with LRAD at MOD I level to improve functional mobility.    Status Partially Met     PT LONG TERM GOAL #4   Title Pt will perform TUG with LRAD in </=30sec. to improve function and decr. falls risk.    Status Partially Met     PT LONG TERM GOAL #5   Title Pt will perform stand pivot txfs from chair to w/c at MOD I level to improve functional mobility.    Status Achieved     PT LONG TERM GOAL #6   Title Pt will improve BERG score to >/=31/56 to decr. falls risk.    Baseline 27/56 on 09/07/16, therefore, PT revised long term BERG goal from 25/56 to 31/56   Status Achieved               Plan - 10/14/16 1158    Clinical Impression Statement Pt partially met LTGs 3 and 4. Pt did not meet LTG 2, this could be due to pt's fear of falling if she incr. gait speed. Pt would continue to benefit from skilled PT to improve functional mobility safety. PT requesting add'l 2x/week for 4 weeks.  PT will revise BERG score next session, due to pt's progress.   Rehab Potential Good   Clinical Impairments Affecting Rehab Potential co-morbidities   PT Frequency 2x / week   PT Duration 4 weeks  original POC 2x/week for 8 weeks   PT Treatment/Interventions ADLs/Self Care Home Management;Biofeedback;Canalith Repostioning;Electrical Stimulation;Cognitive remediation;Neuromuscular re-education;Balance training;Therapeutic exercise;Therapeutic activities;Functional mobility training;Stair training;Gait training;DME Instruction;Orthotic Fit/Training;Patient/family education;Vestibular;Manual techniques   PT Next Visit Plan Finish checking LTGs and renew.   PT Home Exercise Plan  Strengthening HEP.    Consulted and Agree with Plan of Care Patient;Family member/caregiver   Family Member Consulted pt's mom: Bonita Quin      Patient will benefit from skilled therapeutic intervention in order to improve the following deficits and impairments:  Decreased endurance, Decreased knowledge of use of DME, Decreased strength, Impaired UE functional use, Impaired sensation, Decreased balance, Decreased mobility, Decreased range of motion, Decreased coordination, Impaired flexibility, Decreased  cognition, Abnormal gait  Visit Diagnosis: Hemiplegia and hemiparesis following cerebral infarction affecting left dominant side ([place]) - Plan: PT plan of care cert/re-cert  Other abnormalities of gait and mobility - Plan: PT plan of care cert/re-cert  Other lack of coordination - Plan: PT plan of care cert/re-cert     Problem List Patient Active Problem List   Diagnosis Date Noted  . Left flaccid hemiplegia 06/24/2016  . Ear itching   . Poor fluid intake   . Bradycardia, drug induced   . Poor nutrition   . Muscle spasticity   . Obesity (BMI 30.0-34.9) 04/08/2016  . Hyperlipidemia 04/08/2016  . Basal ganglia hemorrhage ([place]) 04/08/2016  . Aphasia as late effect of cerebrovascular accident   . Dysphagia as late effect of cerebrovascular disease   . Migraine with aura and without status migrainosus, not intractable   . HLD (hyperlipidemia)   . Benign essential HTN   . Gait disturbance, post-stroke   . Hemiplegia, post-stroke ([place])   . Hypokalemia   . Dysphagia, post-stroke   . Aphasia, post-stroke   . Bradycardia   . Headache, migraine   . Dysarthria, post-stroke   . Encephalopathy acute 04/02/2016  . ICH (intracerebral hemorrhage) (HCC) - R basal ganglia due to hypertensive emergency 04/01/2016  . Upper airway cough syndrome 12/06/2015  . PCP NOTES >>> 09/25/2015  . Need for hepatitis B vaccination 10/19/2014  . Insomnia 12/28/2012  . Annual physical exam 11/23/2011  .  Hemorrhoids 10/13/2011  . Morbid obesity ([place]) 02/26/2011  . CHEST PAIN UNSPECIFIED 03/28/2010  . Essential hypertension 10/19/2007  . Headache(784.0) 05/12/2007    Mykaila Blunck L 10/14/2016, 12:02 PM  [facility] [address] [place], [place], [address] Phone: [phone]   Fax:  [phone]  Name: Abigail Campos MRN: [id number] Date of Birth: June 03, 1971   Geoffry Paradise, PT,DPT 10/14/16 12:03 PM Phone: [phone] Fax: [phone]

## 2016-10-15 ENCOUNTER — Ambulatory Visit: Payer: Managed Care, Other (non HMO)

## 2016-10-15 ENCOUNTER — Ambulatory Visit: Payer: Managed Care, Other (non HMO) | Attending: Physical Medicine & Rehabilitation | Admitting: Occupational Therapy

## 2016-10-15 VITALS — BP 130/90 | HR 56

## 2016-10-15 DIAGNOSIS — R471 Dysarthria and anarthria: Secondary | ICD-10-CM | POA: Diagnosis present

## 2016-10-15 DIAGNOSIS — R2681 Unsteadiness on feet: Secondary | ICD-10-CM | POA: Insufficient documentation

## 2016-10-15 DIAGNOSIS — R278 Other lack of coordination: Secondary | ICD-10-CM | POA: Diagnosis present

## 2016-10-15 DIAGNOSIS — I69115 Cognitive social or emotional deficit following nontraumatic intracerebral hemorrhage: Secondary | ICD-10-CM | POA: Insufficient documentation

## 2016-10-15 DIAGNOSIS — I69015 Cognitive social or emotional deficit following nontraumatic subarachnoid hemorrhage: Secondary | ICD-10-CM

## 2016-10-15 DIAGNOSIS — M25512 Pain in left shoulder: Secondary | ICD-10-CM | POA: Insufficient documentation

## 2016-10-15 DIAGNOSIS — R2689 Other abnormalities of gait and mobility: Secondary | ICD-10-CM

## 2016-10-15 DIAGNOSIS — R41841 Cognitive communication deficit: Secondary | ICD-10-CM | POA: Insufficient documentation

## 2016-10-15 DIAGNOSIS — I69352 Hemiplegia and hemiparesis following cerebral infarction affecting left dominant side: Secondary | ICD-10-CM

## 2016-10-15 NOTE — Therapy (Signed)
[facility] [address] [place], [place], [address] Phone: [phone]   Fax:  [phone]  Occupational Therapy Treatment  Patient Details  Name: Abigail Campos MRN: [id number] Date of Birth: 03-Jul-1971 Referring Provider: Dr. Posey Pronto  Encounter Date: 10/15/2016      OT End of Session - 10/15/16 1315    Visit Number 13   Number of Visits 25   Date for OT Re-Evaluation 12/09/16   Authorization Type [employer] (240 visit limit combined with home health, estimated HH used 24 combined), no auth required    Authorization Time Period wk 7/12   OT Start Time 1319   OT Stop Time 1400   OT Time Calculation (min) 41 min   Activity Tolerance Patient tolerated treatment well   Behavior During Therapy [facility] for tasks assessed/performed      Past Medical History:  Diagnosis Date  . Essential hypertension 10/19/2007   03-2010: metoprolol changed to bystolic (was not feeling well on it, no specific allergy or reaction)    . Hypertension   . Stroke [facility])     Past Surgical History:  Procedure Laterality Date  . ABDOMINAL HYSTERECTOMY  11-14-07   no oophorectomy per surgical report  . CESAREAN SECTION     [id number]  . INGUINAL HERNIA REPAIR     2004    There were no vitals filed for this visit.      Subjective Assessment - 10/15/16 1315    Subjective  "It's loud in here today"   Pertinent History see Epic   Patient Stated Goals to be able to use left arm   Currently in Pain? No/denies        In supine,  AAROM ER/IR, elbow flex/ext, shoulder flex with pt attempting to initiate--pt performed with mod-max facilitation initially, then progressed to mod facilitation (appeared to demo incr difficulty with distractions today).  In sitting, Light wt. Bearing through L hand in abduction with lateral wt. Shifts/body on arm movements with min-mod cues, min facilitation.    In supine, rolling to L side for body on arm movements  with min cueing and facilitation for positioning.  In sitting, functional wt. Shift to the L with therapist facilitation L shoulder abduction in prep for functional reach with facilitation only for LUE and for light wt. Bearing through elbow.  Pt demo incr core control with movements to the left.   Recommended pt schedule additional visits due to progress.  However, pt wanted to wait until she could verify benefits with husband.                      OT Short Term Goals - 10/08/16 1702      OT SHORT TERM GOAL #1   Title Pt/ caregiver will be I with HEP.   Time 6   Period Weeks   Status On-going     OT SHORT TERM GOAL #2   Title Pt/ family will verbalize understanding of LUE positioning to minimize pain and risk for injury, including splinting PRN.   Time 6   Period Weeks   Status Achieved  10/01/16     OT SHORT TERM GOAL #3   Title Pt will donn shirt with min A.   Time 6   Period Weeks   Status Achieved  10/01/16  mod I     OT SHORT TERM GOAL #4   Title Pt will demonstrate 90* P/ROM shoulder flexion in LUE with pain less than  or equal to 3/10.   Time 6   Period Weeks   Status Achieved  10/06/16 met to 90* with no pain     OT SHORT TERM GOAL #5   Title Pt will perform simple snack prep modified independently.   Time 6   Period Weeks   Status On-going  10/08/16  not performing as she primarily uses hemi-walker and can't carry anything in LUE.     OT SHORT TERM GOAL #6   Title Pt will perform bathing at a supervision level   Time 6   Period Weeks   Status Achieved  10/01/16 met per pt report           OT Long Term Goals - 10/01/16 1653      OT LONG TERM GOAL #1   Title Pt will perform LB dressing with min A.   Time 12   Period Weeks   Status Achieved  10/01/16:  needs A for L shoe/brace     OT LONG TERM GOAL #2   Title Pt will use LUE as a stabilizer/ gross A with min A.   Time 12   Period Weeks   Status New     OT LONG TERM GOAL #3    Title Pt will perform simple home management tasks with min A.   Time 6   Period Weeks   Status New     OT LONG TERM GOAL #4   Title Pt will demonstrate adequate standing balance to retrieve a light weight object from overhead cabinet modified independently with RUE without LOB in prep for IADLS.   Time 12   Period Weeks   Status New     OT LONG TERM GOAL #5   Title Pt will perform shower transfers modified independently   Time 12   Period Weeks   Status New               Plan - 10/15/16 1547    Clinical Impression Statement Pt continues to progress slowly toward goals, but decr attention in busy enironment affects performance.   Rehab Potential Good   Clinical Impairments Affecting Rehab Potential severity of deficits   OT Frequency 2x / week   OT Duration 12 weeks   OT Treatment/Interventions Self-care/ADL training;Moist Heat;Fluidtherapy;DME and/or AE instruction;Splinting;Patient/family education;Balance Financial planner;Contrast Bath;Ultrasound;Therapeutic exercise;Therapeutic activities;Cognitive remediation/compensation;Passive range of motion;Functional Mobility Training;Neuromuscular education;Cryotherapy;Electrical Stimulation;Parrafin;Energy conservation;Manual Therapy;Visual/perceptual remediation/compensation   Plan continue with neuro re-ed, simple home maintenance task, schedule additional therapy visits   OT Home Exercise Plan 09/07/16:  resting splint wear/care   Consulted and Agree with Plan of Care Patient      Patient will benefit from skilled therapeutic intervention in order to improve the following deficits and impairments:  Abnormal gait, Decreased coordination, Decreased range of motion, Difficulty walking, Impaired flexibility, Impaired sensation, Increased edema, Decreased safety awareness, Decreased endurance, Decreased activity tolerance, Decreased knowledge of precautions, Increased fascial restricitons, Impaired tone, Pain, Impaired UE  functional use, Decreased knowledge of use of DME, Decreased balance, Decreased cognition, Decreased mobility, Decreased strength, Impaired perceived functional ability, Impaired vision/preception  Visit Diagnosis: Hemiplegia and hemiparesis following cerebral infarction affecting left dominant side (HCC)  Other lack of coordination  Other abnormalities of gait and mobility    Problem List Patient Active Problem List   Diagnosis Date Noted  . Left flaccid hemiplegia 06/24/2016  . Ear itching   . Poor fluid intake   . Bradycardia, drug induced   . Poor nutrition   .  Muscle spasticity   . Obesity (BMI 30.0-34.9) 04/08/2016  . Hyperlipidemia 04/08/2016  . Basal ganglia hemorrhage ([place]) 04/08/2016  . Aphasia as late effect of cerebrovascular accident   . Dysphagia as late effect of cerebrovascular disease   . Migraine with aura and without status migrainosus, not intractable   . HLD (hyperlipidemia)   . Benign essential HTN   . Gait disturbance, post-stroke   . Hemiplegia, post-stroke ([place])   . Hypokalemia   . Dysphagia, post-stroke   . Aphasia, post-stroke   . Bradycardia   . Headache, migraine   . Dysarthria, post-stroke   . Encephalopathy acute 04/02/2016  . ICH (intracerebral hemorrhage) (HCC) - R basal ganglia due to hypertensive emergency 04/01/2016  . Upper airway cough syndrome 12/06/2015  . PCP NOTES >>> 09/25/2015  . Need for hepatitis B vaccination 10/19/2014  . Insomnia 12/28/2012  . Annual physical exam 11/23/2011  . Hemorrhoids 10/13/2011  . Morbid obesity ([place]) 02/26/2011  . CHEST PAIN UNSPECIFIED 03/28/2010  . Essential hypertension 10/19/2007  . Headache(784.0) 05/12/2007    [facility] 10/15/2016, 5:23 PM  [facility] [address] [place], [place], [address] Phone: [phone]   Fax:  [phone]  Name: SHATIMA ZALAR MRN: [id number] Date of Birth: 12-13-1971   Vianne Bulls, OTR/L [facility] [address] [place], [place]  [address] [phone] phone [phone] 10/15/16 5:23 PM

## 2016-10-15 NOTE — Therapy (Addendum)
[facility] [address] [place], [place], [address] Phone: [phone]   Fax:  [phone]  Speech Language Pathology Treatment  Patient Details  Name: Abigail Campos MRN: [id number] Date of Birth: 08/21/1971 Referring Provider: Delice Lesch, M.D.  Encounter Date: 10/15/2016      End of Session - 10/15/16 1542    Visit Number 15   Number of Visits 16   Date for SLP Re-Evaluation 10/23/16   SLP Start Time 1448   SLP Stop Time  6203   SLP Time Calculation (min) 42 min   Activity Tolerance Patient tolerated treatment well      Past Medical History:  Diagnosis Date  . Essential hypertension 10/19/2007   03-2010: metoprolol changed to bystolic (was not feeling well on it, no specific allergy or reaction)    . Hypertension   . Stroke [facility])     Past Surgical History:  Procedure Laterality Date  . ABDOMINAL HYSTERECTOMY  11-14-07   no oophorectomy per surgical report  . CESAREAN SECTION     [id number]  . INGUINAL HERNIA REPAIR     2004    There were no vitals filed for this visit.      Subjective Assessment - 10/15/16 1453    Subjective Pt wheeled herself in today. "Anderson Malta had me walk around the building."               ADULT SLP TREATMENT - 10/15/16 1453      General Information   Behavior/Cognition Alert;Cooperative;Pleasant mood     Treatment Provided   Treatment provided Cognitive-Linquistic     Cognitive-Linquistic Treatment   Treatment focused on Cognition   Skilled Treatment Alternatign attention targeted today in ST. Pt had written task and SLP distracted pt with question she had to alternate attention for, noted pt did not have strategy for keeping her place. SLP suggested pt hold her pencil on the place where she needs to return to, and after this recommendation pt req'd only one cue to do so. Pt was able to return to place where she left off much easier after that.      Assessment / Recommendations / Plan   Plan Continue with current plan of care     Progression Toward Goals   Progression toward goals Progressing toward goals            SLP Short Term Goals - 10/08/16 1640      SLP SHORT TERM GOAL #1   Title pt will name 3 deficit areas over three sessions   Time 1   Period Weeks   Status Not Met     SLP SHORT TERM GOAL #2   Title pt will demo selective attention to therapy tasks for 10 minutes in min-mod noisy environment   Status Achieved     SLP SHORT TERM GOAL #3   Title pt will complete tasks of simple cognitive linguistic organization with 90% success and rare min A   Status Achieved     SLP SHORT TERM GOAL #4   Title pt will attempt to self correct misarticulated speech in 50% of opportunities in structured speech tasks   Status Deferred  due to focus on cognitive goals          SLP Long Term Goals - 10/15/16 1456      SLP LONG TERM GOAL #1   Title pt will demo alternating attention in simple cognitive linguistic tasks with appropriate switch times, with compensations  Time --   Period --   Status Achieved     SLP LONG TERM GOAL #2   Title pt will demo emergent awareness in cognitive linguistic tasks by self correcting errors in 80% of opportunities with nonverbal cues   Time 2   Period Weeks   Status On-going     SLP LONG TERM GOAL #3   Title pt will demo functional organization in mod complex linguistic tasks with modified independence   Time 2   Period Weeks   Status On-going     SLP LONG TERM GOAL #4   Title pt will demo appropriate rate of speech in 5 minutes simple to mod complex conversation with occasional nonverbal cues   Time 2   Period Weeks   Status Deferred  due to cognitive-ling goals     SLP LONG TERM GOAL #5   Title pt will demo HEP for dysarthria with min A   Time 2   Period Weeks   Status Deferred  due to cognitive ling goals          Plan - 10/15/16 1542    Clinical Impression  Statement Pt continues to benefit from continued skilled ST intervention with focus on higher level cognitive goals including attention, awareness, and reasoning.   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 Left flaccid hemiplegia 06/24/2016  . Ear itching   . Poor fluid intake   . Bradycardia, drug induced   . Poor nutrition   . Muscle spasticity   . Obesity (BMI 30.0-34.9) 04/08/2016  . Hyperlipidemia 04/08/2016  . Basal ganglia hemorrhage ([place]) 04/08/2016  . Aphasia as late effect of cerebrovascular accident   . Dysphagia as late effect of cerebrovascular disease   . Migraine with aura and without status migrainosus, not intractable   . HLD (hyperlipidemia)   . Benign essential HTN   . Gait disturbance, post-stroke   . Hemiplegia, post-stroke ([place])   . Hypokalemia   . Dysphagia, post-stroke   . Aphasia, post-stroke   . Bradycardia   . Headache, migraine   . Dysarthria, post-stroke   . Encephalopathy acute 04/02/2016  . ICH (intracerebral hemorrhage) (HCC) - R basal ganglia due to hypertensive emergency 04/01/2016  . Upper airway cough syndrome 12/06/2015  . PCP NOTES >>> 09/25/2015  . Need for hepatitis B vaccination 10/19/2014  . Insomnia 12/28/2012  . Annual physical exam 11/23/2011  . Hemorrhoids 10/13/2011  . Morbid obesity ([place]) 02/26/2011  . CHEST PAIN  UNSPECIFIED 03/28/2010  . Essential hypertension 10/19/2007  . Headache(784.0) 05/12/2007    [place] ,Abigail Campos, [place]  10/15/2016, 3:43 PM  [facility] [address] [place], [place], [address] Phone: [phone]   Fax:  [phone]   Name: Abigail Campos MRN: [id number] Date of Birth: 1971-06-29

## 2016-10-15 NOTE — Therapy (Signed)
[facility] [address] [place], [place], [address] Phone: [phone]   Fax:  [phone]  Physical Therapy Treatment  Patient Details  Name: Abigail Campos MRN: [id number] Date of Birth: 05-Aug-1971 Referring Provider: Dr. Posey Pronto  Encounter Date: 10/15/2016      PT End of Session - 10/15/16 1658    Visit Number 16   Number of Visits 25   Date for PT Re-Evaluation 11/09/16   Authorization Type [employer]-240 visit limit (including HH and OP). Per Lattie Haw, pt used 24 total HHPT, [place], and [place] visits.   PT Start Time 1405   PT Stop Time 1444   PT Time Calculation (min) 39 min   Equipment Utilized During Treatment Gait belt   Activity Tolerance Patient tolerated treatment well   Behavior During Therapy WFL for tasks assessed/performed      Past Medical History:  Diagnosis Date  . Essential hypertension 10/19/2007   03-2010: metoprolol changed to bystolic (was not feeling well on it, no specific allergy or reaction)    . Hypertension   . Stroke [facility])     Past Surgical History:  Procedure Laterality Date  . ABDOMINAL HYSTERECTOMY  11-14-07   no oophorectomy per surgical report  . CESAREAN SECTION     [id number]  . INGUINAL HERNIA REPAIR     2004    Vitals:   10/15/16 1436  BP: 130/90  Pulse: (!) 56        Subjective Assessment - 10/15/16 1407    Subjective Pt reported she feels a little tired today. Pt denied falls since last visit. Pt has appt with [facility] next Wednesday (10/21/16) to adjust AFO.    Pertinent History HTN, hyperlipidemia, hx of migraine   Patient Stated Goals I want to walk and stand up without falling.    Currently in Pain? No/denies                         [facility] Adult PT Treatment/Exercise - 10/15/16 1413      Ambulation/Gait   Ambulation/Gait Yes   Ambulation/Gait Assistance 5: Supervision;4: Min guard   Ambulation/Gait Assistance Details Pt amb. over even/uneven  terrain with cues to improve LLE stance time and R step length.   Ambulation Distance (Feet) 500 Feet  30'x2   Assistive device Large base quad cane   Gait Pattern Decreased stride length;Decreased stance time - left;Decreased step length - right;Decreased dorsiflexion - left;Left circumduction;Left genu recurvatum;Step-to pattern;Step-through pattern   Ambulation Surface Level;Unlevel;Indoor;Outdoor;Paved     High Level Balance   High Level Balance Activities Backward walking   High Level Balance Comments Performed in // bars with 1 UE support and S for safety, cues to improve stride length and upright posture. 4x7'                PT Education - 10/14/16 1157    Education provided Yes   Education Details PT discussed goal progress and renewal.   Person(s) Educated Patient   Methods Explanation   Comprehension Verbalized understanding          PT Short Term Goals - 09/17/16 1641      PT SHORT TERM GOAL #1   Title Pt will be IND in performing HEP to improve strength and balance. TARGET DATE FOR ALL STGS: 09/08/16   Status Partially Met     PT SHORT TERM GOAL #2   Title Perform BERG and write goals if appropriate.  Status Achieved     PT SHORT TERM GOAL #3   Title Pt will amb. 200' over even terrain with LRAD at MOD I level to improve functional mobility.    Status Partially Met     PT SHORT TERM GOAL #4   Title Pt will improve gait speed to 0.2f/sec with LRAD to decrease falls risk.    Status Not Met     PT SHORT TERM GOAL #5   Title Pt will improve BERG score to >/=21/56 to decr. falls risk.    Status Achieved           PT Long Term Goals - 10/15/16 1700      PT LONG TERM GOAL #1   Title Pt will verbalize understanding or CVA risk factors and signs/symptoms to reduce risk of additional CVA. TARGET DATE FOR ALL LTGS: 10/06/16   Baseline ALL UNMET GOALS WILL BE CARRIED OVER TO NEW POC: 11/09/16   Status Achieved     PT LONG TERM GOAL #2   Title Pt  will improve gait speed to >/=1.879fsec. with LRAD to reduce falls risk.    Status On-going     PT LONG TERM GOAL #3   Title Pt will amb. 400' over even/paved surfaces with LRAD at MOD I level to improve functional mobility.    Status On-going     PT LONG TERM GOAL #4   Title Pt will perform TUG with LRAD in </=30sec. to improve function and decr. falls risk.    Status On-going     PT LONG TERM GOAL #5   Title Pt will perform stand pivot txfs from chair to w/c at MOD I level to improve functional mobility.    Status Achieved     PT LONG TERM GOAL #6   Title Pt will improve BERG score to >/=37/56 to decr. falls risk.    Status Revised               Plan - 10/15/16 1658    Clinical Impression Statement Pt demonstrated progress as she was able to amb. longer distances over even/uneven terrain. Pt demonstrated decr. L knee flexion and stride length during backwards amb., and required manual and verbal cues to improve L knee flexion. continue with POC.    Rehab Potential Good   Clinical Impairments Affecting Rehab Potential co-morbidities   PT Frequency 2x / week   PT Duration 4 weeks  original POC 2x/week for 8 weeks   PT Treatment/Interventions ADLs/Self Care Home Management;Biofeedback;Canalith Repostioning;Electrical Stimulation;Cognitive remediation;Neuromuscular re-education;Balance training;Therapeutic exercise;Therapeutic activities;Functional mobility training;Stair training;Gait training;DME Instruction;Orthotic Fit/Training;Patient/family education;Vestibular;Manual techniques   PT Next Visit Plan Progress HEP as tolerated. continue gait training with cane (trial SBQC).   PT Home Exercise Plan Strengthening HEP.    Consulted and Agree with Plan of Care Patient;Family member/caregiver   Family Member Consulted pt's mom: Abigail Campos    Patient will benefit from skilled therapeutic intervention in order to improve the following deficits and impairments:  Decreased endurance,  Decreased knowledge of use of DME, Decreased strength, Impaired UE functional use, Impaired sensation, Decreased balance, Decreased mobility, Decreased range of motion, Decreased coordination, Impaired flexibility, Decreased cognition, Abnormal gait  Visit Diagnosis: Other abnormalities of gait and mobility  Hemiplegia and hemiparesis following cerebral infarction affecting left dominant side ([facility]    Problem List Patient Active Problem List   Diagnosis Date Noted  . Left flaccid hemiplegia 06/24/2016  . Ear itching   . Poor fluid intake   .  Bradycardia, drug induced   . Poor nutrition   . Muscle spasticity   . Obesity (BMI 30.0-34.9) 04/08/2016  . Hyperlipidemia 04/08/2016  . Basal ganglia hemorrhage ([place]) 04/08/2016  . Aphasia as late effect of cerebrovascular accident   . Dysphagia as late effect of cerebrovascular disease   . Migraine with aura and without status migrainosus, not intractable   . HLD (hyperlipidemia)   . Benign essential HTN   . Gait disturbance, post-stroke   . Hemiplegia, post-stroke ([place])   . Hypokalemia   . Dysphagia, post-stroke   . Aphasia, post-stroke   . Bradycardia   . Headache, migraine   . Dysarthria, post-stroke   . Encephalopathy acute 04/02/2016  . ICH (intracerebral hemorrhage) (HCC) - R basal ganglia due to hypertensive emergency 04/01/2016  . Upper airway cough syndrome 12/06/2015  . PCP NOTES >>> 09/25/2015  . Need for hepatitis B vaccination 10/19/2014  . Insomnia 12/28/2012  . Annual physical exam 11/23/2011  . Hemorrhoids 10/13/2011  . Morbid obesity ([place]) 02/26/2011  . CHEST PAIN UNSPECIFIED 03/28/2010  . Essential hypertension 10/19/2007  . Headache(784.0) 05/12/2007    Taaj Hurlbut L 10/15/2016, 5:01 PM  [facility] [address] [place], [place], [address] Phone: [phone]   Fax:  [phone]  Name: Abigail Campos MRN: [id number] Date of Birth:  Jun 28, 1971  Geoffry Paradise, PT,DPT 10/15/16 5:02 PM Phone: [phone] Fax: [phone]

## 2016-10-15 NOTE — Patient Instructions (Signed)
  Please complete the assigned speech therapy homework and return it to your next session.  

## 2016-10-24 ENCOUNTER — Other Ambulatory Visit: Payer: Self-pay | Admitting: Internal Medicine

## 2016-10-24 DIAGNOSIS — R058 Other specified cough: Secondary | ICD-10-CM

## 2016-10-24 DIAGNOSIS — R05 Cough: Secondary | ICD-10-CM

## 2016-10-26 ENCOUNTER — Ambulatory Visit: Payer: Managed Care, Other (non HMO) | Admitting: Occupational Therapy

## 2016-10-26 ENCOUNTER — Ambulatory Visit: Payer: Managed Care, Other (non HMO)

## 2016-10-26 DIAGNOSIS — R2689 Other abnormalities of gait and mobility: Secondary | ICD-10-CM

## 2016-10-26 DIAGNOSIS — R471 Dysarthria and anarthria: Secondary | ICD-10-CM

## 2016-10-26 DIAGNOSIS — I69352 Hemiplegia and hemiparesis following cerebral infarction affecting left dominant side: Secondary | ICD-10-CM | POA: Diagnosis not present

## 2016-10-26 DIAGNOSIS — I69115 Cognitive social or emotional deficit following nontraumatic intracerebral hemorrhage: Secondary | ICD-10-CM

## 2016-10-26 DIAGNOSIS — M25512 Pain in left shoulder: Secondary | ICD-10-CM

## 2016-10-26 DIAGNOSIS — R278 Other lack of coordination: Secondary | ICD-10-CM

## 2016-10-26 DIAGNOSIS — I69015 Cognitive social or emotional deficit following nontraumatic subarachnoid hemorrhage: Secondary | ICD-10-CM

## 2016-10-26 NOTE — Therapy (Signed)
[facility] [address] [place], [place], [address] Phone: [phone]   Fax:  [phone]  Speech Language Pathology Treatment  Patient Details  Name: Abigail Campos MRN: [id number] Date of Birth: May 19, 1971 Referring Provider: Delice Lesch, M.D.  Encounter Date: 10/26/2016      End of Session - 10/26/16 1656    Visit Number 16   Number of Visits 32   Date for SLP Re-Evaluation 01/08/17   SLP Start Time 1448   SLP Stop Time  [id number]   SLP Time Calculation (min) 42 min   Activity Tolerance Patient tolerated treatment well      Past Medical History:  Diagnosis Date  . Essential hypertension 10/19/2007   03-2010: metoprolol changed to bystolic (was not feeling well on it, no specific allergy or reaction)    . Hypertension   . Stroke [facility])     Past Surgical History:  Procedure Laterality Date  . ABDOMINAL HYSTERECTOMY  11-14-07   no oophorectomy per surgical report  . CESAREAN SECTION     [id number]  . INGUINAL HERNIA REPAIR     2004    There were no vitals filed for this visit.      Subjective Assessment - 10/26/16 1452    Subjective "Working on my arm."   Currently in Pain? No/denies               ADULT SLP TREATMENT - 10/26/16 1447      General Information   Behavior/Cognition Alert;Cooperative;Pleasant mood     Treatment Provided   Treatment provided Cognitive-Linquistic     Cognitive-Linquistic Treatment   Treatment focused on Cognition   Skilled Treatment Pt reported on homework (word search) this weekend she worked until she felt like she was losing focus and then came back 60-90 minutes later to finish. SLP told her this was an excellent strategy. Pt generated 3 cognitive deficits with mod A for 1/3 - self monitoring re: reading).  Pt stated memory and concentration. Pt later said "focus" - so SLP educated pt that focus and concentration were really the same thing.  In simple  alternating attention task, pt kept her place with crossing out stimuli routinely. In practical organizational task, pt performed with excellent success, but did not go back to check that all her multiple stimuli were seen/addressed.     Assessment / Recommendations / Plan   Plan Continue with current plan of care     Progression Toward Goals   Progression toward goals Progressing toward goals            SLP Short Term Goals - 10/26/16 1700      SLP SHORT TERM GOAL #1   Title pt will name 3 deficit areas independentlly, in two sessions   Baseline goal renewed/modified week of 10-26-16   Time 4   Period Weeks   Status On-going     SLP SHORT TERM GOAL #2   Title pt will demo selective attention to therapy tasks for 10 minutes in min-mod noisy environment   Status Achieved     SLP SHORT TERM GOAL #3   Title pt will complete tasks of simple cognitive linguistic organization with 90% success and rare min A   Status Achieved     SLP SHORT TERM GOAL #4   Title pt will attempt to self correct misarticulated speech in 50% of opportunities in structured speech tasks   Baseline goal renewed week of 10-26-16   Time 4  Period Weeks   Status On-going          SLP Long Term Goals - 10/26/16 1702      SLP LONG TERM GOAL #1   Title pt will demo alternating attention in simple cognitive linguistic tasks with appropriate switch times, with compensations   Status Achieved     SLP LONG TERM GOAL #2   Title pt will demo emergent awareness in cognitive linguistic tasks by self correcting errors in 80% of opportunities with nonverbal cues   Time 8   Period Weeks   Status On-going     SLP LONG TERM GOAL #3   Title pt will demo functional organization in mod complex linguistic tasks with modified independence   Time 8   Period Weeks   Status On-going     SLP LONG TERM GOAL #4   Title pt will demo rate of speech in 5 minutes appropriate for 95-99% intelligibility in simple to mod  complex conversation with occasional verbal cues   Time 8   Period Weeks   Status On-going     SLP LONG TERM GOAL #5   Title pt will demo knowledge of fast speech rate decreasing intelligibility when in conversation and will self correct 90% of the time   Time 8   Period Weeks   Status New  due to cognitive ling goals          Plan - 10/26/16 1657    Clinical Impression Statement See goal summary for update. Pt continues to benefit from continued skilled ST intervention with focus on higher level cognitive skills including alternating (and divided) attention, emergent awareness, and self monitoring/executive function. Pt also has mild dysarthria which to this point has not been addressed due to work on cognitive linguistic goals, but those goals will be enforced on pt's new plan of care.   Speech Therapy Frequency 2x / week   Duration --  8 weeks (or 16 more/32 total ST sessions)   Treatment/Interventions Compensatory strategies;Patient/family education;Functional tasks;Cueing hierarchy;Cognitive reorganization;Internal/external aids;SLP instruction and feedback;Oral motor exercises  any or all will be completed during ST sessions   Potential to Achieve Goals Good   Potential Considerations Severity of impairments      Patient will benefit from skilled therapeutic intervention in order to improve the following deficits and impairments:   Cognitive communication deficit  Dysarthria and anarthria    Problem List Patient Active Problem List   Diagnosis Date Noted  . Left flaccid hemiplegia 06/24/2016  . Ear itching   . Poor fluid intake   . Bradycardia, drug induced   . Poor nutrition   . Muscle spasticity   . Obesity (BMI 30.0-34.9) 04/08/2016  . Hyperlipidemia 04/08/2016  . Basal ganglia hemorrhage ([place]) 04/08/2016  . Aphasia as late effect of cerebrovascular accident   . Dysphagia as late effect of cerebrovascular disease   . Migraine with aura and without status  migrainosus, not intractable   . HLD (hyperlipidemia)   . Benign essential HTN   . Gait disturbance, post-stroke   . Hemiplegia, post-stroke ([place])   . Hypokalemia   . Dysphagia, post-stroke   . Aphasia, post-stroke   . Bradycardia   . Headache, migraine   . Dysarthria, post-stroke   . Encephalopathy acute 04/02/2016  . ICH (intracerebral hemorrhage) (HCC) - R basal ganglia due to hypertensive emergency 04/01/2016  . Upper airway cough syndrome 12/06/2015  . PCP NOTES >>> 09/25/2015  . Need for hepatitis B vaccination 10/19/2014  . Insomnia  12/28/2012  . Annual physical exam 11/23/2011  . Hemorrhoids 10/13/2011  . Morbid obesity ([place]) 02/26/2011  . CHEST PAIN UNSPECIFIED 03/28/2010  . Essential hypertension 10/19/2007  . Headache(784.0) 05/12/2007    [facility] ,[place], [place]  10/26/2016, 5:06 PM  [facility] [address] [place], [place], [address] Phone: [phone]   Fax:  [phone]   Name: AVISHA BARTOSH MRN: [id number] Date of Birth: 1971-04-25

## 2016-10-26 NOTE — Patient Instructions (Signed)
  Please complete the assigned speech therapy homework and return it to your next session.  

## 2016-10-26 NOTE — Therapy (Signed)
[facility] [address] [place], [place], [address] Phone: [phone]   Fax:  [phone]  Occupational Therapy Treatment  Patient Details  Name: Abigail Campos MRN: [id number] Date of Birth: 05-02-1971 Referring Provider: Dr. Posey Pronto  Encounter Date: 10/26/2016      OT End of Session - 10/26/16 1405    Visit Number 14   Number of Visits 25   Date for OT Re-Evaluation 12/09/16   Authorization Type [employer] (240 visit limit combined with home health, estimated HH used 24 combined), no auth required    Authorization Time Period wk 8/12   OT Start Time 1405   OT Stop Time 1445   OT Time Calculation (min) 40 min   Activity Tolerance Patient tolerated treatment well   Behavior During Therapy [facility] for tasks assessed/performed      Past Medical History:  Diagnosis Date  . Essential hypertension 10/19/2007   03-2010: metoprolol changed to bystolic (was not feeling well on it, no specific allergy or reaction)    . Hypertension   . Stroke [facility])     Past Surgical History:  Procedure Laterality Date  . ABDOMINAL HYSTERECTOMY  11-14-07   no oophorectomy per surgical report  . CESAREAN SECTION     [id number]  . INGUINAL HERNIA REPAIR     2004    There were no vitals filed for this visit.      Subjective Assessment - 10/26/16 1509    Subjective  "I tried to work it last week since I didn't have therapy"  Pt reports that she made coffee by herself   Pertinent History see Epic   Patient Stated Goals to be able to use left arm   Currently in Pain? No/denies      In supine,  AAROM ER/IR, elbow flex/ext, shoulder flex, abduction.  Pt performed with mod facilitation initially, then progressed to min-mod facilitation.  In supine, AAROM chest press with PVC frame with BUEs with mod facilitation at L elbow.  Pt did well with stable wrist and holding frame.   In sitting, Light wt. Bearing through L hand in abduction with  lateral wt. Shifts/body on arm movements with min cues, min facilitation.    In sitting, functional wt. Bearing through both hands in forward flex with min-mod cues, min facilitation.  Attempted standing with modified quadraped with wt. On bilateral hands however, unable to wt. Bear through L hand in this position due to spasticity today.                          OT Education - 10/26/16 1510    Education Details Recommended pt perform cold/simple snack prep from w/c level for transport but standing at counter when needed   Person(s) Educated Patient   Methods Explanation   Comprehension Verbalized understanding          OT Short Term Goals - 10/26/16 1511      OT SHORT TERM GOAL #1   Title Pt/ caregiver will be I with HEP.   Time 6   Period Weeks   Status Achieved     OT SHORT TERM GOAL #2   Title Pt/ family will verbalize understanding of LUE positioning to minimize pain and risk for injury, including splinting PRN.   Time 6   Period Weeks   Status Achieved  10/01/16, 10/26/16     OT SHORT TERM GOAL #3   Title Pt will donn shirt  with min A.   Time 6   Period Weeks   Status Achieved  10/01/16  mod I     OT SHORT TERM GOAL #4   Title Pt will demonstrate 90* P/ROM shoulder flexion in LUE with pain less than or equal to 3/10.   Time 6   Period Weeks   Status Achieved  10/06/16 met to 90* with no pain     OT SHORT TERM GOAL #5   Title Pt will perform simple snack prep modified independently.   Time 6   Period Weeks   Status On-going  10/08/16  not performing as she primarily uses hemi-walker and can't carry anything in LUE.;  10/26/16  made coffee     OT SHORT TERM GOAL #6   Title Pt will perform bathing at a supervision level   Time 6   Period Weeks   Status Achieved  10/01/16 met per pt report           OT Long Term Goals - 10/01/16 1653      OT LONG TERM GOAL #1   Title Pt will perform LB dressing with min A.   Time 12   Period  Weeks   Status Achieved  10/01/16:  needs A for L shoe/brace     OT LONG TERM GOAL #2   Title Pt will use LUE as a stabilizer/ gross A with min A.   Time 12   Period Weeks   Status New     OT LONG TERM GOAL #3   Title Pt will perform simple home management tasks with min A.   Time 6   Period Weeks   Status New     OT LONG TERM GOAL #4   Title Pt will demonstrate adequate standing balance to retrieve a light weight object from overhead cabinet modified independently with RUE without LOB in prep for IADLS.   Time 12   Period Weeks   Status New     OT LONG TERM GOAL #5   Title Pt will perform shower transfers modified independently   Time 12   Period Weeks   Status New               Plan - 10/26/16 1510    Clinical Impression Statement Pt continues to progress slowly towards goals with incr attention to L side and incr core stability.   Rehab Potential Good   Clinical Impairments Affecting Rehab Potential severity of deficits   OT Frequency 2x / week   OT Duration 12 weeks   OT Treatment/Interventions Self-care/ADL training;Moist Heat;Fluidtherapy;DME and/or AE instruction;Splinting;Patient/family education;Balance Financial planner;Contrast Bath;Ultrasound;Therapeutic exercise;Therapeutic activities;Cognitive remediation/compensation;Passive range of motion;Functional Mobility Training;Neuromuscular education;Cryotherapy;Electrical Stimulation;Parrafin;Energy conservation;Manual Therapy;Visual/perceptual remediation/compensation   Plan continue with neuro re-ed, simple home maintence task, attempt to use LUE as a stabilizer, wt. bearing in standing/sitting   OT Home Exercise Plan 09/07/16:  resting splint wear/care   Consulted and Agree with Plan of Care Patient      Patient will benefit from skilled therapeutic intervention in order to improve the following deficits and impairments:  Abnormal gait, Decreased coordination, Decreased range of motion, Difficulty  walking, Impaired flexibility, Impaired sensation, Increased edema, Decreased safety awareness, Decreased endurance, Decreased activity tolerance, Decreased knowledge of precautions, Increased fascial restricitons, Impaired tone, Pain, Impaired UE functional use, Decreased knowledge of use of DME, Decreased balance, Decreased cognition, Decreased mobility, Decreased strength, Impaired perceived functional ability, Impaired vision/preception  Visit Diagnosis: Hemiplegia and hemiparesis following cerebral infarction affecting left  dominant side ([place])  Other lack of coordination  Acute pain of left shoulder  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 Left flaccid hemiplegia 06/24/2016  . Ear itching   . Poor fluid intake   . Bradycardia, drug induced   . Poor nutrition   . Muscle spasticity   . Obesity (BMI 30.0-34.9) 04/08/2016  . Hyperlipidemia 04/08/2016  . Basal ganglia hemorrhage ([place]) 04/08/2016  . Aphasia as late effect of cerebrovascular accident   . Dysphagia as late effect of cerebrovascular disease   . Migraine with aura and without status migrainosus, not intractable   . HLD (hyperlipidemia)   . Benign essential HTN   . Gait disturbance, post-stroke   . Hemiplegia, post-stroke ([place])   . Hypokalemia   . Dysphagia, post-stroke   . Aphasia, post-stroke   . Bradycardia   . Headache, migraine   . Dysarthria, post-stroke   . Encephalopathy acute 04/02/2016  . ICH (intracerebral hemorrhage) (HCC) - R basal ganglia due to hypertensive emergency 04/01/2016  . Upper airway cough syndrome 12/06/2015  . PCP NOTES >>> 09/25/2015  . Need for hepatitis B vaccination 10/19/2014  . Insomnia 12/28/2012  . Annual physical exam 11/23/2011  . Hemorrhoids 10/13/2011  . Morbid obesity ([place]) 02/26/2011  . CHEST PAIN UNSPECIFIED  03/28/2010  . Essential hypertension 10/19/2007  . Headache(784.0) 05/12/2007    [facility] 10/26/2016, 3:15 PM  [facility] [address] [place], [place], [address] Phone: [phone]   Fax:  [phone]  Name: Abigail Campos MRN: [id number] Date of Birth: 09-Jul-1971   Vianne Bulls, OTR/L [facility] [address] [place],   [address] [phone] phone [phone] 10/26/16 3:16 PM

## 2016-10-30 ENCOUNTER — Ambulatory Visit: Payer: Managed Care, Other (non HMO)

## 2016-10-30 ENCOUNTER — Ambulatory Visit: Payer: Managed Care, Other (non HMO) | Admitting: Occupational Therapy

## 2016-10-30 DIAGNOSIS — R471 Dysarthria and anarthria: Secondary | ICD-10-CM

## 2016-10-30 DIAGNOSIS — I69352 Hemiplegia and hemiparesis following cerebral infarction affecting left dominant side: Secondary | ICD-10-CM

## 2016-10-30 DIAGNOSIS — R278 Other lack of coordination: Secondary | ICD-10-CM

## 2016-10-30 DIAGNOSIS — I69015 Cognitive social or emotional deficit following nontraumatic subarachnoid hemorrhage: Secondary | ICD-10-CM

## 2016-10-30 NOTE — Patient Instructions (Signed)
  Please complete the assigned speech therapy homework and return it to your next session.  

## 2016-10-30 NOTE — Patient Instructions (Addendum)
Functional Quadriceps: Sit to Stand    Have someone there with you for safety, try not to hold onto chair or arm rest.  Have a sturdy chair in front of you and place a 2 inch book under right foot, in order to promote weight shifting onto Left leg. Sit on edge of chair, feet flat on floor. Stand upright, extending knees fully. Repeat __10__ times per set. Do __1__ sets per session. Do __1__ sessions per day. http://orth.exer.us/735  Copyright  VHI. All rights reserved.  Bridge    Bring belly button to spine. Lie back, legs bent. Inhale, pressing hips up. Keeping ribs in, lengthen lower back, then brings hips back to mat/bed. Make sure to keep your left leg from going out to the side. Repeat _10___ times. Do __3__ sessions per day.  http://pm.exer.us/55 STOP THIS ONE AND PERFORM WITH LEGS ON BALL, SEE BELOW.  Copyright  VHI. All rights reserved.  Bridge    Lift hips, keeping pelvis level. Do _10__ times, _3__ times per session. Perform 3-4 days per week.  http://ss.exer.us/365   Copyright  VHI. All rights reserved.    Straight Leg Raise    Tighten stomach and slowly raise locked left leg __6__ inches from floor. Repeat __10__ times per set. Do __3__ sets per session. Do __1__ session. Perform 3-4 days per week. http://orth.exer.us/1103  Copyright  VHI. All rights reserved.  Toe / Heel Raise (Sitting)    Sitting, raise heels, then rock back on heels and raise toes. Repeat __20__ times. Perform every day.  Copyright  VHI. All rights reserved.  ABDUCTION: Sitting (Active)    Sit with feet flat. Lift left leg slightly and draw it out to side. Then return to center. Complete _3__ sets of _10__ repetitions. Perform __1_ session. Perform 3-4 days per week.   Copyright  VHI. All rights reserved.  Heel Slide    Perform in chair, seated, with sock on left foot, to slide on floor. Bend left knee and pull heel toward buttocks. Repeat __10__  times. Do __1__ sessions per day.  http://gt2.exer.us/301   Copyright  VHI. All rights reserved.

## 2016-10-30 NOTE — Therapy (Addendum)
[facility] [address] [place], [place], [address] Phone: [phone]   Fax:  [phone]  Speech Language Pathology Treatment  Patient Details  Name: Abigail Campos MRN: [id number] Date of Birth: 10-24-1971 Referring Provider: Delice Lesch, M.D.  Encounter Date: 10/30/2016      End of Session - 10/30/16 1520    Visit Number 17   Number of Visits 32   Date for SLP Re-Evaluation 01/08/17   SLP Start Time 1319   SLP Stop Time  1401   SLP Time Calculation (min) 42 min      Past Medical History:  Diagnosis Date  . Essential hypertension 10/19/2007   03-2010: metoprolol changed to bystolic (was not feeling well on it, no specific allergy or reaction)    . Hypertension   . Stroke [facility])     Past Surgical History:  Procedure Laterality Date  . ABDOMINAL HYSTERECTOMY  11-14-07   no oophorectomy per surgical report  . CESAREAN SECTION     [id number]  . INGUINAL HERNIA REPAIR     2004    There were no vitals filed for this visit.      Subjective Assessment - 10/30/16 1329    Subjective We were here early.               ADULT SLP TREATMENT - 10/30/16 1329      General Information   Behavior/Cognition Alert;Cooperative;Pleasant mood     Treatment Provided   Treatment provided Cognitive-Linquistic     Pain Assessment   Pain Assessment No/denies pain     Cognitive-Linquistic Treatment   Treatment focused on Cognition   Skilled Treatment SLP targeted pt's attention to detail in cognitive-linguistic tasks today. Pt req'd occasional min cues for reading allthe words in the sentences, and req'd cues for problem solving how to track if she's used the word in her sentence in scrambled sentence task. Pt req'd mod SLP cues occasionally to use all words in the scrambled sentence.      Assessment / Recommendations / Plan   Plan Continue with current plan of care     Progression Toward Goals    Progression toward goals Progressing toward goals            SLP Short Term Goals - 10/30/16 1521      SLP SHORT TERM GOAL #1   Title pt will name 3 deficit areas independentlly, in two sessions   Baseline goal renewed/modified week of 10-26-16   Time 4   Period Weeks   Status On-going     SLP SHORT TERM GOAL #2   Title pt will demo selective attention to therapy tasks for 10 minutes in min-mod noisy environment   Status Achieved     SLP SHORT TERM GOAL #3   Title pt will complete tasks of simple cognitive linguistic organization with 90% success and rare min A   Status Achieved     SLP SHORT TERM GOAL #4   Title pt will attempt to self correct misarticulated speech in 50% of opportunities in structured speech tasks   Baseline goal renewed week of 10-26-16   Time 4   Period Weeks   Status On-going          SLP Long Term Goals - 10/30/16 1332      SLP LONG TERM GOAL #1   Title pt will demo alternating attention in simple cognitive linguistic tasks with appropriate switch times, with compensations   Status Achieved  SLP LONG TERM GOAL #2   Title pt will demo emergent awareness in cognitive linguistic tasks by self correcting errors in 80% of opportunities with nonverbal cues   Time 8   Period Weeks   Status On-going     SLP LONG TERM GOAL #3   Title pt will demo functional organization in mod complex linguistic tasks with modified independence   Time 8   Period Weeks   Status On-going     SLP LONG TERM GOAL #4   Title pt will demo rate of speech in 5 minutes appropriate for 95-99% intelligibility in simple to mod complex conversation with occasional verbal cues   Time 8   Period Weeks   Status On-going     SLP LONG TERM GOAL #5   Title pt will demo knowledge of fast speech rate decreasing intelligibility when in conversation and will self correct 90% of the time   Time 8   Period Weeks   Status New  due to cognitive ling goals     SLP LONG TERM GOAL  #6   Title pt will demo           Plan - 10/30/16 1520    Clinical Impression Statement Pt continues to benefit from continued skilled ST intervention with focus on higher level cognitive skills including alternating (and divided) attention, emergent awareness, and self monitoring/executive function. Pt also has mild dysarthria which to this point has not been addressed due to work on cognitive linguistic goals, but those goals will be enforced on pt's new plan of care.   Speech Therapy Frequency 2x / week   Duration --  8 weeks or 16 more/32 total ST sessions   Treatment/Interventions Compensatory strategies;Patient/family education;Functional tasks;Cueing hierarchy;Cognitive reorganization;Internal/external aids;SLP instruction and feedback;Oral motor exercises   Potential to Achieve Goals Good   Potential Considerations Severity of impairments      Patient will benefit from skilled therapeutic intervention in order to improve the following deficits and impairments:    Cognitive social or emotional deficit following nontraumatic subarachnoid hemorrhage  Dysarthria and anarthria      Problem List Patient Active Problem List   Diagnosis Date Noted  . Left flaccid hemiplegia 06/24/2016  . Ear itching   . Poor fluid intake   . Bradycardia, drug induced   . Poor nutrition   . Muscle spasticity   . Obesity (BMI 30.0-34.9) 04/08/2016  . Hyperlipidemia 04/08/2016  . Basal ganglia hemorrhage ([place]) 04/08/2016  . Aphasia as late effect of cerebrovascular accident   . Dysphagia as late effect of cerebrovascular disease   . Migraine with aura and without status migrainosus, not intractable   . HLD (hyperlipidemia)   . Benign essential HTN   . Gait disturbance, post-stroke   . Hemiplegia, post-stroke ([place])   . Hypokalemia   . Dysphagia, post-stroke   . Aphasia, post-stroke   . Bradycardia   . Headache, migraine   . Dysarthria, post-stroke   . Encephalopathy acute 04/02/2016   . ICH (intracerebral hemorrhage) (HCC) - R basal ganglia due to hypertensive emergency 04/01/2016  . Upper airway cough syndrome 12/06/2015  . PCP NOTES >>> 09/25/2015  . Need for hepatitis B vaccination 10/19/2014  . Insomnia 12/28/2012  . Annual physical exam 11/23/2011  . Hemorrhoids 10/13/2011  . Morbid obesity ([place]) 02/26/2011  . CHEST PAIN UNSPECIFIED 03/28/2010  . Essential hypertension 10/19/2007  . Headache(784.0) 05/12/2007    [facility] ,[place], [place]  10/30/2016, 3:22 PM  [facility]  [address] [place], [place], [address] Phone: [phone]   Fax:  [phone]   Name: Abigail Campos MRN: [id number] Date of Birth: March 09, 1971

## 2016-10-30 NOTE — Therapy (Signed)
[facility] [address] [place], [place], [address] Phone: [phone]   Fax:  [phone]  Physical Therapy Treatment  Patient Details  Name: Abigail Campos MRN: [id number] Date of Birth: 1971-02-17 Referring Provider: Dr. Posey Pronto  Encounter Date: 10/30/2016      PT End of Session - 10/30/16 1539    Visit Number 17   Number of Visits 25   Date for PT Re-Evaluation 11/09/16   Authorization Type Aetna-240 visit limit (including HH and OP). Per Lattie Haw, pt used 24 total HHPT, [place], and [place] visits.   PT Start Time 1448   PT Stop Time 1529   PT Time Calculation (min) 41 min   Activity Tolerance Patient tolerated treatment well   Behavior During Therapy WFL for tasks assessed/performed      Past Medical History:  Diagnosis Date  . Essential hypertension 10/19/2007   03-2010: metoprolol changed to bystolic (was not feeling well on it, no specific allergy or reaction)    . Hypertension   . Stroke [facility])     Past Surgical History:  Procedure Laterality Date  . ABDOMINAL HYSTERECTOMY  11-14-07   no oophorectomy per surgical report  . CESAREAN SECTION     [id number]  . INGUINAL HERNIA REPAIR     2004    There were no vitals filed for this visit.      Subjective Assessment - 10/30/16 1452    Subjective Pt denied falls since last visit. Pt saw orthotist, [name] from [place], and he placed an ant. strap on R AFO.   Pertinent History HTN, hyperlipidemia, hx of migraine   Patient Stated Goals I want to walk and stand up without falling.    Currently in Pain? No/denies            Therex: Pt performed previous strengthening HEP and progressed strengthening HEP with S to ensure safety. Pt required cues for technique and progressed from min A to hold physioball in place during bridges to S. Pt also performed B heel slides in supine and progressed to seated L heel slides. Please see pt instructions for details.                       PT Education - 10/30/16 1538    Education provided Yes   Education Details PT reviewed and progressed strengthening HEP.    Person(s) Educated Patient   Methods Explanation;Demonstration;Handout;Verbal cues   Comprehension Returned demonstration;Verbalized understanding          PT Short Term Goals - 09/17/16 1641      PT SHORT TERM GOAL #1   Title Pt will be IND in performing HEP to improve strength and balance. TARGET DATE FOR ALL STGS: 09/08/16   Status Partially Met     PT SHORT TERM GOAL #2   Title Perform BERG and write goals if appropriate.   Status Achieved     PT SHORT TERM GOAL #3   Title Pt will amb. 200' over even terrain with LRAD at MOD I level to improve functional mobility.    Status Partially Met     PT SHORT TERM GOAL #4   Title Pt will improve gait speed to 0.82f/sec with LRAD to decrease falls risk.    Status Not Met     PT SHORT TERM GOAL #5   Title Pt will improve BERG score to >/=21/56 to decr. falls risk.    Status Achieved  PT Long Term Goals - 10/30/16 1541      PT LONG TERM GOAL #1   Title Pt will verbalize understanding or CVA risk factors and signs/symptoms to reduce risk of additional CVA. TARGET DATE FOR ALL LTGS: 11/09/16   Baseline ALL UNMET GOALS WILL BE CARRIED OVER TO NEW POC: 11/09/16   Status Achieved     PT LONG TERM GOAL #2   Title Pt will improve gait speed to >/=1.46f/sec. with LRAD to reduce falls risk.    Status On-going     PT LONG TERM GOAL #3   Title Pt will amb. 400' over even/paved surfaces with LRAD at MOD I level to improve functional mobility.    Status On-going     PT LONG TERM GOAL #4   Title Pt will perform TUG with LRAD in </=30sec. to improve function and decr. falls risk.    Status On-going     PT LONG TERM GOAL #5   Title Pt will perform stand pivot txfs from chair to w/c at MOD I level to improve functional mobility.    Status Achieved     PT LONG  TERM GOAL #6   Title Pt will improve BERG score to >/=37/56 to decr. falls risk.    Status On-going               Plan - 10/30/16 1539    Clinical Impression Statement Pt demonstrated improved strength, as she was able to tolerate progressed strengthening HEP. Pt continues to require cues during L SLR to decr. L quad lag. Pt would continue to benefit from skilled PT to improve safety during functional mobility.    Rehab Potential Good   Clinical Impairments Affecting Rehab Potential co-morbidities   PT Frequency 2x / week   PT Duration 4 weeks  original POC 2x/week for 8 weeks   PT Treatment/Interventions ADLs/Self Care Home Management;Biofeedback;Canalith Repostioning;Electrical Stimulation;Cognitive remediation;Neuromuscular re-education;Balance training;Therapeutic exercise;Therapeutic activities;Functional mobility training;Stair training;Gait training;DME Instruction;Orthotic Fit/Training;Patient/family education;Vestibular;Manual techniques   PT Next Visit Plan continue gait training with cane (trial SBQC).   PT Home Exercise Plan Strengthening HEP.    Consulted and Agree with Plan of Care Patient;Family member/caregiver   Family Member Consulted pt's mom: LVaughan Basta     Patient will benefit from skilled therapeutic intervention in order to improve the following deficits and impairments:  Decreased endurance, Decreased knowledge of use of DME, Decreased strength, Impaired UE functional use, Impaired sensation, Decreased balance, Decreased mobility, Decreased range of motion, Decreased coordination, Impaired flexibility, Decreased cognition, Abnormal gait  Visit Diagnosis: Hemiplegia and hemiparesis following cerebral infarction affecting left dominant side ([place]  Other lack of coordination     Problem List Patient Active Problem List   Diagnosis Date Noted  . Left flaccid hemiplegia 06/24/2016  . Ear itching   . Poor fluid intake   . Bradycardia, drug induced   . Poor  nutrition   . Muscle spasticity   . Obesity (BMI 30.0-34.9) 04/08/2016  . Hyperlipidemia 04/08/2016  . [place]al ganglia hemorrhage ([place] 04/08/2016  . Aphasia as late effect of cerebrovascular accident   . Dysphagia as late effect of cerebrovascular disease   . Migraine with aura and without status migrainosus, not intractable   . HLD (hyperlipidemia)   . Benign essential HTN   . Gait disturbance, post-stroke   . Hemiplegia, post-stroke ([place]   . Hypokalemia   . Dysphagia, post-stroke   . Aphasia, post-stroke   . Bradycardia   . Headache, migraine   .  Dysarthria, post-stroke   . Encephalopathy acute 04/02/2016  . ICH (intracerebral hemorrhage) (HCC) - R basal ganglia due to hypertensive emergency 04/01/2016  . Upper airway cough syndrome 12/06/2015  . PCP NOTES >>> 09/25/2015  . Need for hepatitis B vaccination 10/19/2014  . Insomnia 12/28/2012  . Annual physical exam 11/23/2011  . Hemorrhoids 10/13/2011  . Morbid obesity ([place]) 02/26/2011  . CHEST PAIN UNSPECIFIED 03/28/2010  . Essential hypertension 10/19/2007  . Headache(784.0) 05/12/2007    Veronda Gabor L 10/30/2016, 3:42 PM  [facility] [address] [place], [place], [address] Phone: [phone]   Fax:  [phone]  Name: CYNAI SKEENS MRN: [id number] Date of Birth: [birth year]  Geoffry Paradise, PT,DPT 10/30/16 3:45 PM Phone: [phone] Fax: [phone]

## 2016-10-31 NOTE — Therapy (Signed)
[facility] [address] [place], [place], [address] Phone: [phone]   Fax:  [phone]  Occupational Therapy Treatment  Patient Details  Name: Abigail Campos MRN: [id number] Date of Birth: Jul 14, 1971 Referring Provider: Dr. Posey Pronto  Encounter Date: 10/30/2016      OT End of Session - 10/31/16 1024    Visit Number 15   Number of Visits 25   Date for OT Re-Evaluation 12/09/16   Authorization Type Aetna (240 visit limit combined with home health, estimated [place] used 24 combined), no auth required    Authorization Time Period wk 9/12   OT Start Time 1405   OT Stop Time 1445   OT Time Calculation (min) 40 min   Activity Tolerance Patient tolerated treatment well   Behavior During Therapy [facility] for tasks assessed/performed      Past Medical History:  Diagnosis Date  . Essential hypertension 10/19/2007   03-2010: metoprolol changed to bystolic (was not feeling well on it, no specific allergy or reaction)    . Hypertension   . Stroke [facility])     Past Surgical History:  Procedure Laterality Date  . ABDOMINAL HYSTERECTOMY  11-14-07   no oophorectomy per surgical report  . CESAREAN SECTION     [id number]  . INGUINAL HERNIA REPAIR     2004    There were no vitals filed for this visit.      Subjective Assessment - 10/31/16 1021    Subjective  Denies pain, reports plans this weekend for her birthday   Pertinent History see Epic   Patient Stated Goals to be able to use left arm   Currently in Pain? No/denies            Treatment: Pt requested to take her weight so she stool on the scale with a chair in front of her for support(wt. 137.5 lbs,)Pt reports she's disappointed she wants to weigh more . Therapist discussed with pt that keeping her weight down would help her move easier and reduce risk of future stokes. Seated on mat weightbearing through hand and then elbow with lateral trunk  movements, Weightbearing through bilateral hands to one side with lateral weightshifts, mod facilitation NMES x 8 mins, 50 pps, 250 pw 10 secs cycle intensity 20 to wrist and finger extensors for tone reduction, therapsit facilitating fingers during on cycle. Weightbearing through UE ranger for AA/ROM shoulder flexion, mod faciliation.                      OT Short Term Goals - 10/26/16 1511      OT SHORT TERM GOAL #1   Title Pt/ caregiver will be I with HEP.   Time 6   Period Weeks   Status Achieved     OT SHORT TERM GOAL #2   Title Pt/ family will verbalize understanding of LUE positioning to minimize pain and risk for injury, including splinting PRN.   Time 6   Period Weeks   Status Achieved  10/01/16, 10/26/16     OT SHORT TERM GOAL #3   Title Pt will donn shirt with min A.   Time 6   Period Weeks   Status Achieved  10/01/16  mod I     OT SHORT TERM GOAL #4   Title Pt will demonstrate 90* P/ROM shoulder flexion in LUE with pain less than or equal to 3/10.   Time 6   Period Weeks   Status Achieved  10/06/16  met to 90* with no pain     OT SHORT TERM GOAL #5   Title Pt will perform simple snack prep modified independently.   Time 6   Period Weeks   Status On-going  10/08/16  not performing as she primarily uses hemi-walker and can't carry anything in LUE.;  10/26/16  made coffee     OT SHORT TERM GOAL #6   Title Pt will perform bathing at a supervision level   Time 6   Period Weeks   Status Achieved  10/01/16 met per pt report           OT Long Term Goals - 10/01/16 1653      OT LONG TERM GOAL #1   Title Pt will perform LB dressing with min A.   Time 12   Period Weeks   Status Achieved  10/01/16:  needs A for L shoe/brace     OT LONG TERM GOAL #2   Title Pt will use LUE as a stabilizer/ gross A with min A.   Time 12   Period Weeks   Status New     OT LONG TERM GOAL #3   Title Pt will perform simple home management tasks with min  A.   Time 6   Period Weeks   Status New     OT LONG TERM GOAL #4   Title Pt will demonstrate adequate standing balance to retrieve a light weight object from overhead cabinet modified independently with RUE without LOB in prep for IADLS.   Time 12   Period Weeks   Status New     OT LONG TERM GOAL #5   Title Pt will perform shower transfers modified independently   Time 12   Period Weeks   Status New               Plan - 10/31/16 1023    Clinical Impression Statement Pt is progressing towards goals with improving left side awareness.   Rehab Potential Good   Clinical Impairments Affecting Rehab Potential severity of deficits   OT Frequency 2x / week   OT Duration 12 weeks   OT Treatment/Interventions Self-care/ADL training;Moist Heat;Fluidtherapy;DME and/or AE instruction;Splinting;Patient/family education;Balance Financial planner;Contrast Bath;Ultrasound;Therapeutic exercise;Therapeutic activities;Cognitive remediation/compensation;Passive range of motion;Functional Mobility Training;Neuromuscular education;Cryotherapy;Electrical Stimulation;Parrafin;Energy conservation;Manual Therapy;Visual/perceptual remediation/compensation   Plan neuro re-ed, simple home management task   OT Home Exercise Plan 09/07/16:  resting splint wear/care   Consulted and Agree with Plan of Care Patient      Patient will benefit from skilled therapeutic intervention in order to improve the following deficits and impairments:  Abnormal gait, Decreased coordination, Decreased range of motion, Difficulty walking, Impaired flexibility, Impaired sensation, Increased edema, Decreased safety awareness, Decreased endurance, Decreased activity tolerance, Decreased knowledge of precautions, Increased fascial restricitons, Impaired tone, Pain, Impaired UE functional use, Decreased knowledge of use of DME, Decreased balance, Decreased cognition, Decreased mobility, Decreased strength, Impaired perceived  functional ability, Impaired vision/preception  Visit Diagnosis: Hemiplegia and hemiparesis following cerebral infarction affecting left dominant side (HCC)  Other lack of coordination    Problem List Patient Active Problem List   Diagnosis Date Noted  . Left flaccid hemiplegia 06/24/2016  . Ear itching   . Poor fluid intake   . Bradycardia, drug induced   . Poor nutrition   . Muscle spasticity   . Obesity (BMI 30.0-34.9) 04/08/2016  . Hyperlipidemia 04/08/2016  . Basal ganglia hemorrhage ([place]) 04/08/2016  . Aphasia as late effect of cerebrovascular accident   .  Dysphagia as late effect of cerebrovascular disease   . Migraine with aura and without status migrainosus, not intractable   . HLD (hyperlipidemia)   . Benign essential HTN   . Gait disturbance, post-stroke   . Hemiplegia, post-stroke ([place])   . Hypokalemia   . Dysphagia, post-stroke   . Aphasia, post-stroke   . Bradycardia   . Headache, migraine   . Dysarthria, post-stroke   . Encephalopathy acute 04/02/2016  . ICH (intracerebral hemorrhage) (HCC) - R basal ganglia due to hypertensive emergency 04/01/2016  . Upper airway cough syndrome 12/06/2015  . PCP NOTES >>> 09/25/2015  . Need for hepatitis B vaccination 10/19/2014  . Insomnia 12/28/2012  . Annual physical exam 11/23/2011  . Hemorrhoids 10/13/2011  . Morbid obesity ([place]) 02/26/2011  . CHEST PAIN UNSPECIFIED 03/28/2010  . Essential hypertension 10/19/2007  . Headache(784.0) 05/12/2007    Kayana Thoen 10/31/2016, 10:28 AM  [facility] [address] [place], [place], [address] Phone: [phone]   Fax:  [phone]  Name: TAMANI DURNEY MRN: [id number] Date of Birth: 04-30-1971

## 2016-11-02 ENCOUNTER — Ambulatory Visit: Payer: Managed Care, Other (non HMO) | Admitting: Physical Therapy

## 2016-11-02 ENCOUNTER — Encounter: Payer: Managed Care, Other (non HMO) | Admitting: Occupational Therapy

## 2016-11-03 ENCOUNTER — Ambulatory Visit: Payer: Managed Care, Other (non HMO) | Admitting: Occupational Therapy

## 2016-11-03 ENCOUNTER — Ambulatory Visit: Payer: Managed Care, Other (non HMO)

## 2016-11-03 ENCOUNTER — Ambulatory Visit: Payer: Managed Care, Other (non HMO) | Admitting: Speech Pathology

## 2016-11-03 DIAGNOSIS — R278 Other lack of coordination: Secondary | ICD-10-CM

## 2016-11-03 DIAGNOSIS — R2689 Other abnormalities of gait and mobility: Secondary | ICD-10-CM

## 2016-11-03 DIAGNOSIS — I69352 Hemiplegia and hemiparesis following cerebral infarction affecting left dominant side: Secondary | ICD-10-CM

## 2016-11-03 DIAGNOSIS — I69015 Cognitive social or emotional deficit following nontraumatic subarachnoid hemorrhage: Secondary | ICD-10-CM

## 2016-11-03 DIAGNOSIS — R471 Dysarthria and anarthria: Secondary | ICD-10-CM

## 2016-11-03 DIAGNOSIS — I69115 Cognitive social or emotional deficit following nontraumatic intracerebral hemorrhage: Secondary | ICD-10-CM

## 2016-11-03 NOTE — Therapy (Signed)
[facility] [address] [place], [place], [address] Phone: [phone]   Fax:  [phone]  Occupational Therapy Treatment  Patient Details  Name: Abigail Campos MRN: [id number] Date of Birth: 11/21/1971 Referring Provider: Dr. Posey Pronto  Encounter Date: 11/03/2016      OT End of Session - 11/03/16 1714    Visit Number 16   Number of Visits 25   Date for OT Re-Evaluation 12/09/16   Authorization Type Aetna (240 visit limit combined with home health, estimated [place] used 24 combined), no auth required    Authorization Time Period wk 10/12   OT Start Time 1405   OT Stop Time 1445   OT Time Calculation (min) 40 min   Activity Tolerance Patient tolerated treatment well   Behavior During Therapy [facility] for tasks assessed/performed      Past Medical History:  Diagnosis Date  . Essential hypertension 10/19/2007   03-2010: metoprolol changed to bystolic (was not feeling well on it, no specific allergy or reaction)    . Hypertension   . Stroke [facility])     Past Surgical History:  Procedure Laterality Date  . ABDOMINAL HYSTERECTOMY  11-14-07   no oophorectomy per surgical report  . CESAREAN SECTION     [id number]  . INGUINAL HERNIA REPAIR     2004    There were no vitals filed for this visit.      Subjective Assessment - 11/03/16 1407    Subjective  (P)  L foot is swollen   Pertinent History (P)  see Epic   Patient Stated Goals (P)  to be able to use left arm   Currently in Pain? (P)  No/denies         In supine,  AAROM elbow flex/ext, shoulder flex.  Pt performed with min-mod facilitation   In supine, roll to the L for wt. Bearing thought L side/arm with min v.c.  In sitting, Light wt. Bearing through L hand in abduction with lateral wt. Shifts/body on arm movements with min cues, min facilitation.    In sitting, functional wt. Bearing through L hands in forward flex with min-mod cues, min  facilitation.  AAROM shoulder flex/elbow ext with UE ranger in sitting.  Pt reports light sensitivity and reports diplopia (at night only).  Pt expressed frustration with visual changes from CVA and reports that her eyes water now.  Provided pt with neuro ophthalmologist info if she wants to pursue.                      OT Short Term Goals - 10/26/16 1511      OT SHORT TERM GOAL #1   Title Pt/ caregiver will be I with HEP.   Time 6   Period Weeks   Status Achieved     OT SHORT TERM GOAL #2   Title Pt/ family will verbalize understanding of LUE positioning to minimize pain and risk for injury, including splinting PRN.   Time 6   Period Weeks   Status Achieved  10/01/16, 10/26/16     OT SHORT TERM GOAL #3   Title Pt will donn shirt with min A.   Time 6   Period Weeks   Status Achieved  10/01/16  mod I     OT SHORT TERM GOAL #4   Title Pt will demonstrate 90* P/ROM shoulder flexion in LUE with pain less than or equal to 3/10.   Time 6   Period  Weeks   Status Achieved  10/06/16 met to 90* with no pain     OT SHORT TERM GOAL #5   Title Pt will perform simple snack prep modified independently.   Time 6   Period Weeks   Status On-going  10/08/16  not performing as she primarily uses hemi-walker and can't carry anything in LUE.;  10/26/16  made coffee     OT SHORT TERM GOAL #6   Title Pt will perform bathing at a supervision level   Time 6   Period Weeks   Status Achieved  10/01/16 met per pt report           OT Long Term Goals - 10/01/16 1653      OT LONG TERM GOAL #1   Title Pt will perform LB dressing with min A.   Time 12   Period Weeks   Status Achieved  10/01/16:  needs A for L shoe/brace     OT LONG TERM GOAL #2   Title Pt will use LUE as a stabilizer/ gross A with min A.   Time 12   Period Weeks   Status New     OT LONG TERM GOAL #3   Title Pt will perform simple home management tasks with min A.   Time 6   Period Weeks    Status New     OT LONG TERM GOAL #4   Title Pt will demonstrate adequate standing balance to retrieve a light weight object from overhead cabinet modified independently with RUE without LOB in prep for IADLS.   Time 12   Period Weeks   Status New     OT LONG TERM GOAL #5   Title Pt will perform shower transfers modified independently   Time 12   Period Weeks   Status New               Plan - 11/03/16 1714    Clinical Impression Statement Pt is progressing slowly towards goals with improving core stability and L side awareness.  However, decr attention remains a barrier.   Rehab Potential Good   Clinical Impairments Affecting Rehab Potential severity of deficits; cognitive deficits   OT Frequency 2x / week   OT Duration 12 weeks   OT Treatment/Interventions Self-care/ADL training;Moist Heat;Fluidtherapy;DME and/or AE instruction;Splinting;Patient/family education;Balance Financial planner;Contrast Bath;Ultrasound;Therapeutic exercise;Therapeutic activities;Cognitive remediation/compensation;Passive range of motion;Functional Mobility Training;Neuromuscular education;Cryotherapy;Electrical Stimulation;Parrafin;Energy conservation;Manual Therapy;Visual/perceptual remediation/compensation   Plan neuro re-ed, simple home management task in standing, use of LUE as a stabilizer (did not perform today due to LLE edema)   OT Home Exercise Plan 09/07/16:  resting splint wear/care   Consulted and Agree with Plan of Care Patient      Patient will benefit from skilled therapeutic intervention in order to improve the following deficits and impairments:  Abnormal gait, Decreased coordination, Decreased range of motion, Difficulty walking, Impaired flexibility, Impaired sensation, Increased edema, Decreased safety awareness, Decreased endurance, Decreased activity tolerance, Decreased knowledge of precautions, Increased fascial restricitons, Impaired tone, Pain, Impaired UE functional use,  Decreased knowledge of use of DME, Decreased balance, Decreased cognition, Decreased mobility, Decreased strength, Impaired perceived functional ability, Impaired vision/preception  Visit Diagnosis: Hemiplegia and hemiparesis following cerebral infarction affecting left dominant side (HCC)  Other lack of coordination  Other abnormalities of gait and mobility  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 Left flaccid hemiplegia 06/24/2016  .  Ear itching   . Poor fluid intake   . Bradycardia, drug induced   . Poor nutrition   . Muscle spasticity   . Obesity (BMI 30.0-34.9) 04/08/2016  . Hyperlipidemia 04/08/2016  . Basal ganglia hemorrhage ([place]) 04/08/2016  . Aphasia as late effect of cerebrovascular accident   . Dysphagia as late effect of cerebrovascular disease   . Migraine with aura and without status migrainosus, not intractable   . HLD (hyperlipidemia)   . Benign essential HTN   . Gait disturbance, post-stroke   . Hemiplegia, post-stroke ([place])   . Hypokalemia   . Dysphagia, post-stroke   . Aphasia, post-stroke   . Bradycardia   . Headache, migraine   . Dysarthria, post-stroke   . Encephalopathy acute 04/02/2016  . ICH (intracerebral hemorrhage) (HCC) - R basal ganglia due to hypertensive emergency 04/01/2016  . Upper airway cough syndrome 12/06/2015  . PCP NOTES >>> 09/25/2015  . Need for hepatitis B vaccination 10/19/2014  . Insomnia 12/28/2012  . Annual physical exam 11/23/2011  . Hemorrhoids 10/13/2011  . Morbid obesity ([place]) 02/26/2011  . CHEST PAIN UNSPECIFIED 03/28/2010  . Essential hypertension 10/19/2007  . Headache(784.0) 05/12/2007    [facility] 11/03/2016, 5:18 PM  [facility] [address] [place], [place], [address] Phone: [phone]   Fax:  [phone]  Name: Abigail Campos MRN: [id number] Date of Birth: 20-Aug-1971   Vianne Bulls, OTR/L [facility] [address] [place], [place]  [address] [phone] phone [phone] 11/03/16 5:23 PM

## 2016-11-03 NOTE — Therapy (Signed)
[facility] [address] [place], [place], [address] Phone: [phone]   Fax:  [phone]  Physical Therapy Treatment  Patient Details  Name: Abigail Campos MRN: [id number] Date of Birth: 1971-04-20 Referring Provider: Dr. Posey Pronto  Encounter Date: 11/03/2016      PT End of Session - 11/03/16 1609    Visit Number 18   Number of Visits 25   Date for PT Re-Evaluation 11/09/16   Authorization Type [employer]-240 visit limit (including HH and OP). Per Lattie Haw, pt used 24 total HHPT, [place], and [place] visits.   PT Start Time 1317   PT Stop Time 1359   PT Time Calculation (min) 42 min   Equipment Utilized During Treatment Gait belt   Activity Tolerance Patient tolerated treatment well   Behavior During Therapy WFL for tasks assessed/performed      Past Medical History:  Diagnosis Date  . Essential hypertension 10/19/2007   03-2010: metoprolol changed to bystolic (was not feeling well on it, no specific allergy or reaction)    . Hypertension   . Stroke [facility])     Past Surgical History:  Procedure Laterality Date  . ABDOMINAL HYSTERECTOMY  11-14-07   no oophorectomy per surgical report  . CESAREAN SECTION     [id number]  . INGUINAL HERNIA REPAIR     2004    There were no vitals filed for this visit.      Subjective Assessment - 11/03/16 1321    Subjective Pt denied falls since last visit. Pt reported she did a lot over the weekend, as she celebrated her friends and family. Pt states her LLE is now swollen, pt stated she was in the w/c most of the weekend and didn't walk or do exercises.  Pt denied pain at rest but reported 5/10 pain when trying to donn L shoe.    Pertinent History HTN, hyperlipidemia, hx of migraine   Patient Stated Goals I want to walk and stand up without falling.    Currently in Pain? No/denies                         [facility] Adult PT Treatment/Exercise - 11/03/16 1326       Ambulation/Gait   Ambulation/Gait Yes   Ambulation/Gait Assistance 5: Supervision;4: Min guard   Ambulation/Gait Assistance Details PT assessed L foot prior to amb. 2/2 c/o edema. Pt. amb. with hospital sock over L AFO 2/2 pain when donning L shoe. Cues to improve stride length and heel strike. Cues to improve ant/post weight shifting over inclines/declines.   Ambulation Distance (Feet) 200 Feet  in/outdoors and 75' indoors    Assistive device Large base quad cane   Gait Pattern Decreased stride length;Decreased stance time - left;Decreased step length - right;Decreased dorsiflexion - left;Left circumduction;Left genu recurvatum;Step-to pattern;Step-through pattern   Ambulation Surface Level;Indoor;Outdoor;Unlevel     Berg Balance Test   Sit to Stand Able to stand without using hands and stabilize independently   Standing Unsupported Able to stand safely 2 minutes   Sitting with Back Unsupported but Feet Supported on Floor or Stool Able to sit safely and securely 2 minutes   Stand to Sit Sits safely with minimal use of hands   Transfers Able to transfer safely, definite need of hands   Standing Unsupported with Eyes Closed Able to stand 10 seconds with supervision   Standing Ubsupported with Feet Together Able to place feet together independently and stand for 1  minute with supervision   From Standing, Reach Forward with Outstretched Arm Can reach confidently >25 cm (10")   From Standing Position, Pick up Object from [place] to pick up shoe, needs supervision   From Standing Position, Turn to Look Behind Over each Shoulder Looks behind one side only/other side shows less weight shift   Turn 360 Degrees Needs close supervision or verbal cueing   Standing Unsupported, Alternately Place Feet on Step/Stool Needs assistance to keep from falling or unable to try   Standing Unsupported, One Foot in [place] to take small step independently and hold 30 seconds   Standing on One Leg Able to lift  leg independently and hold > 10 seconds  13sec. on RLE    Total Score 42                PT Education - 11/03/16 1608    Education provided Yes   Education Details PT discussed the importance of performing HEP and amb. and to elevate L foot to decr. edema. PT encouraged pt to inform MD if pain/edema worsens. PT discussed progress and outcome measure/goals.   Person(s) Educated Patient   Methods Explanation   Comprehension Verbalized understanding          PT Short Term Goals - 09/17/16 1641      PT SHORT TERM GOAL #1   Title Pt will be IND in performing HEP to improve strength and balance. TARGET DATE FOR ALL STGS: 09/08/16   Status Partially Met     PT SHORT TERM GOAL #2   Title Perform BERG and write goals if appropriate.   Status Achieved     PT SHORT TERM GOAL #3   Title Pt will amb. 200' over even terrain with LRAD at MOD I level to improve functional mobility.    Status Partially Met     PT SHORT TERM GOAL #4   Title Pt will improve gait speed to 0.55f/sec with LRAD to decrease falls risk.    Status Not Met     PT SHORT TERM GOAL #5   Title Pt will improve BERG score to >/=21/56 to decr. falls risk.    Status Achieved           PT Long Term Goals - 11/03/16 1613      PT LONG TERM GOAL #1   Title Pt will verbalize understanding or CVA risk factors and signs/symptoms to reduce risk of additional CVA. TARGET DATE FOR ALL LTGS: 11/09/16   Baseline ALL UNMET GOALS WILL BE CARRIED OVER TO NEW POC: 11/09/16   Status Achieved     PT LONG TERM GOAL #2   Title Pt will improve gait speed to >/=1.833fsec. with LRAD to reduce falls risk.    Status On-going     PT LONG TERM GOAL #3   Title Pt will amb. 400' over even/paved surfaces with LRAD at MOD I level to improve functional mobility.    Status Partially Met     PT LONG TERM GOAL #4   Title Pt will perform TUG with LRAD in </=30sec. to improve function and decr. falls risk.    Status On-going     PT  LONG TERM GOAL #5   Title Pt will perform stand pivot txfs from chair to w/c at MOD I level to improve functional mobility.    Status Achieved     PT LONG TERM GOAL #6   Title Pt will improve BERG score to >/=37/56 to  decr. falls risk.    Baseline 42/56 on 11/03/16, will revise prior to renewal   Status Achieved               Plan - 11/03/16 1611    Clinical Impression Statement Pt demonstrated progress as she met LTG 6 and partially met LTG 3. PT assessed pt's L L foot and LE (distal to knee) for edema and pain. Pt did have edema on dorsum of foot but not in LLE, no TTP or during amb. without shoe donned (pt had hospital socks with plantar grips on foot). Pt denied warmth or pain in calf region, no warmth or skin discoloration noted during foot exam. PT will finish assessing goals next session and renew pt, due to excellent progress.    Rehab Potential Good   Clinical Impairments Affecting Rehab Potential co-morbidities   PT Frequency 2x / week   PT Duration 4 weeks  original POC 2x/week for 8 weeks   PT Treatment/Interventions ADLs/Self Care Home Management;Biofeedback;Canalith Repostioning;Electrical Stimulation;Cognitive remediation;Neuromuscular re-education;Balance training;Therapeutic exercise;Therapeutic activities;Functional mobility training;Stair training;Gait training;DME Instruction;Orthotic Fit/Training;Patient/family education;Vestibular;Manual techniques   PT Next Visit Plan Finish checking LTGs and renew. continue gait training with cane (trial SBQC).   PT Home Exercise Plan Strengthening HEP.    Consulted and Agree with Plan of Care Patient;Family member/caregiver   Family Member Consulted pt's mom: [name]      Patient will benefit from skilled therapeutic intervention in order to improve the following deficits and impairments:  Decreased endurance, Decreased knowledge of use of DME, Decreased strength, Impaired UE functional use, Impaired sensation, Decreased  balance, Decreased mobility, Decreased range of motion, Decreased coordination, Impaired flexibility, Decreased cognition, Abnormal gait  Visit Diagnosis: Hemiplegia and hemiparesis following cerebral infarction affecting left dominant side ([place])  Other lack of coordination     Problem List Patient Active Problem List   Diagnosis Date Noted  . Left flaccid hemiplegia 06/24/2016  . Ear itching   . Poor fluid intake   . Bradycardia, drug induced   . Poor nutrition   . Muscle spasticity   . Obesity (BMI 30.0-34.9) 04/08/2016  . Hyperlipidemia 04/08/2016  . Basal ganglia hemorrhage ([place]) 04/08/2016  . Aphasia as late effect of cerebrovascular accident   . Dysphagia as late effect of cerebrovascular disease   . Migraine with aura and without status migrainosus, not intractable   . HLD (hyperlipidemia)   . Benign essential HTN   . Gait disturbance, post-stroke   . Hemiplegia, post-stroke ([place])   . Hypokalemia   . Dysphagia, post-stroke   . Aphasia, post-stroke   . Bradycardia   . Headache, migraine   . Dysarthria, post-stroke   . Encephalopathy acute 04/02/2016  . ICH (intracerebral hemorrhage) (HCC) - R basal ganglia due to hypertensive emergency 04/01/2016  . Upper airway cough syndrome 12/06/2015  . PCP NOTES >>> 09/25/2015  . Need for hepatitis B vaccination 10/19/2014  . Insomnia 12/28/2012  . Annual physical exam 11/23/2011  . Hemorrhoids 10/13/2011  . Morbid obesity ([place]) 02/26/2011  . CHEST PAIN UNSPECIFIED 03/28/2010  . Essential hypertension 10/19/2007  . Headache(784.0) 05/12/2007    Elizabeth Paulsen L 11/03/2016, 4:16 PM  [facility] [address] [place], [place], [address] Phone: [phone]   Fax:  [phone]  Name: Abigail Campos MRN: [id number] Date of Birth: 12-10-1971   Geoffry Paradise, PT,DPT 11/03/16 4:16 PM Phone: [phone] Fax: [phone]

## 2016-11-03 NOTE — Therapy (Addendum)
[facility] [address] [place], [place], [address] Phone: [phone]   Fax:  [phone]  Speech Language Pathology Treatment  Patient Details  Name: Abigail Campos MRN: [id number] Date of Birth: October 26, 1971 Referring Provider: Delice Lesch, M.D.  Encounter Date: 11/03/2016      End of Session - 11/03/16 1511    Visit Number 18   Number of Visits 32   Date for SLP Re-Evaluation 01/08/17   SLP Start Time [id number]   SLP Stop Time  1314   SLP Time Calculation (min) 43 min      Past Medical History:  Diagnosis Date  . Essential hypertension 10/19/2007   03-2010: metoprolol changed to bystolic (was not feeling well on it, no specific allergy or reaction)    . Hypertension   . Stroke [facility])     Past Surgical History:  Procedure Laterality Date  . ABDOMINAL HYSTERECTOMY  11-14-07   no oophorectomy per surgical report  . CESAREAN SECTION     [id number]  . INGUINAL HERNIA REPAIR     2004    There were no vitals filed for this visit.      Subjective Assessment - 11/03/16 1236    Subjective "They say I'm doing better but I'm used to doing things fast"               ADULT SLP TREATMENT - 11/03/16 1237      General Information   Behavior/Cognition Alert;Cooperative;Pleasant mood     Treatment Provided   Treatment provided Cognitive-Linquistic     Pain Assessment   Pain Assessment No/denies pain     Cognitive-Linquistic Treatment   Treatment focused on Cognition   Skilled Treatment Organization charting disorganized data/employee hours with rare min A for organization - attention to detail with rare min A., 90% accuracy - pt reported she attempted this task a few weeks ago without success. 28 min. Speech 15 min. Pt utilized slow rate with rare min A in simple conversation. She corrected speech errors with occasional min verbal cues for awareness of 4 errors     Assessment / Recommendations / Plan    Plan Continue with current plan of care     Progression Toward Goals   Progression toward goals Progressing toward goals            SLP Short Term Goals - 11/03/16 1510      SLP SHORT TERM GOAL #1   Title pt will name 3 deficit areas independentlly, in two sessions   Baseline goal renewed/modified week of 10-26-16   Time 3   Period Weeks   Status On-going     SLP SHORT TERM GOAL #2   Title pt will demo selective attention to therapy tasks for 10 minutes in min-mod noisy environment   Status Achieved     SLP SHORT TERM GOAL #3   Title pt will complete tasks of simple cognitive linguistic organization with 90% success and rare min A   Status Achieved     SLP SHORT TERM GOAL #4   Title pt will attempt to self correct misarticulated speech in 50% of opportunities in structured speech tasks   Baseline goal renewed week of 10-26-16   Time 3   Period Weeks   Status On-going          SLP Long Term Goals - 11/03/16 1510      SLP LONG TERM GOAL #1   Title pt will demo alternating attention in  simple cognitive linguistic tasks with appropriate switch times, with compensations   Status Achieved     SLP LONG TERM GOAL #2   Title pt will demo emergent awareness in cognitive linguistic tasks by self correcting errors in 80% of opportunities with nonverbal cues   Time 7   Period Weeks   Status On-going     SLP LONG TERM GOAL #3   Title pt will demo functional organization in mod complex linguistic tasks with modified independence   Time 7   Period Weeks   Status On-going     SLP LONG TERM GOAL #4   Title pt will demo rate of speech in 5 minutes appropriate for 95-99% intelligibility in simple to mod complex conversation with occasional verbal cues   Time 7   Period Weeks   Status On-going     SLP LONG TERM GOAL #5   Title pt will demo knowledge of fast speech rate decreasing intelligibility when in conversation and will self correct 90% of the time   Time 7    Period Weeks   Status On-going  due to cognitive ling goals     SLP LONG TERM GOAL #6   Title pt will demo           Plan - 11/03/16 1508    Clinical Impression Statement Pt required rare min A for midly complex organization task and rare min A for alternating attention today. Continue skilled ST to maximize cognition and intelligilbity for improved independence.    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 Left flaccid hemiplegia 06/24/2016  . Ear itching   . Poor fluid intake   . Bradycardia, drug induced   . Poor nutrition   . Muscle spasticity   . Obesity (BMI 30.0-34.9) 04/08/2016  . Hyperlipidemia 04/08/2016  . Basal ganglia hemorrhage ([place]) 04/08/2016  . Aphasia as late effect of cerebrovascular accident   . Dysphagia as late effect of cerebrovascular disease   . Migraine with aura and without status migrainosus, not intractable   . HLD (hyperlipidemia)   . Benign essential HTN   . Gait disturbance, post-stroke   . Hemiplegia, post-stroke ([place])   . Hypokalemia   . Dysphagia, post-stroke   . Aphasia, post-stroke   . Bradycardia   . Headache, migraine   . Dysarthria, post-stroke   . Encephalopathy acute 04/02/2016  . ICH (intracerebral hemorrhage) (HCC) - R basal ganglia due to hypertensive emergency 04/01/2016  . Upper airway  cough syndrome 12/06/2015  . PCP NOTES >>> 09/25/2015  . Need for hepatitis B vaccination 10/19/2014  . Insomnia 12/28/2012  . Annual physical exam 11/23/2011  . Hemorrhoids 10/13/2011  . Morbid obesity ([place]) 02/26/2011  . CHEST PAIN UNSPECIFIED 03/28/2010  . Essential hypertension 10/19/2007  . Headache(784.0) 05/12/2007    [place] Birdwell, Annye Rusk MS, CCC-SLP 11/03/2016, 3:11 PM  [facility] [address] [place], [place], [address] Phone: [phone]   Fax:  [phone]  Name: Abigail Campos MRN: [id number] Date of Birth: 28-Aug-1971

## 2016-11-12 ENCOUNTER — Ambulatory Visit: Payer: Managed Care, Other (non HMO) | Admitting: Occupational Therapy

## 2016-11-12 ENCOUNTER — Ambulatory Visit: Payer: Managed Care, Other (non HMO)

## 2016-11-12 DIAGNOSIS — R2689 Other abnormalities of gait and mobility: Secondary | ICD-10-CM

## 2016-11-12 DIAGNOSIS — I69352 Hemiplegia and hemiparesis following cerebral infarction affecting left dominant side: Secondary | ICD-10-CM

## 2016-11-12 DIAGNOSIS — I69115 Cognitive social or emotional deficit following nontraumatic intracerebral hemorrhage: Secondary | ICD-10-CM

## 2016-11-12 DIAGNOSIS — R2681 Unsteadiness on feet: Secondary | ICD-10-CM

## 2016-11-12 DIAGNOSIS — R278 Other lack of coordination: Secondary | ICD-10-CM

## 2016-11-12 NOTE — Patient Instructions (Addendum)
1.  Stand at counter to brush your teeth.  Put left hand on counter.  2.  Stand at counter and use both hands to wipe countertop (hand over hand).  3.  Wash tops of your legs when bathing (in sitting) right hand over left.  4.  Place bottle in left hand to twist open with the right.  5.  Place left hand on paper when writing

## 2016-11-12 NOTE — Therapy (Signed)
[facility] [address] [place], [place], [address] Phone: [phone]   Fax:  [phone]  Occupational Therapy Treatment  Patient Details  Name: Abigail Campos MRN: [id number] Date of Birth: [birth year] Referring Provider: Dr. Allena Katz  Encounter Date: 11/12/2016      OT End of Session - 11/12/16 1426    Visit Number 17   Number of Visits 25   Date for OT Re-Evaluation 12/04/16   Authorization Type [employer] (240 visit limit combined with home health, estimated HH used 24 combined), no auth required    Authorization Time Period wk 9/12   OT Start Time 1403   OT Stop Time 1445   OT Time Calculation (min) 42 min   Activity Tolerance Patient tolerated treatment well   Behavior During Therapy [facility] for tasks assessed/performed      Past Medical History:  Diagnosis Date  . Essential hypertension 10/19/2007   03-2010: metoprolol changed to bystolic (was not feeling well on it, no specific allergy or reaction)    . Hypertension   . Stroke [facility])     Past Surgical History:  Procedure Laterality Date  . ABDOMINAL HYSTERECTOMY  11-14-07   no oophorectomy per surgical report  . CESAREAN SECTION     [id number]  . INGUINAL HERNIA REPAIR     2004    There were no vitals filed for this visit.      Subjective Assessment - 11/12/16 1425    Subjective  "just stiff"   Pertinent History see Epic   Patient Stated Goals to be able to use left arm   Currently in Pain? No/denies         In supine,  AAROM elbow flex/ext, shoulder ER/IR.  Pt performed with mod facilitation   In supine, roll to the L for wt. Bearing thought L side/arm with min v.c.  In sitting, functional wt. Bearing through L hand with body on arm movements/wt. Shift to the L  with min-mod cues, min facilitation.  In sitting, with both hands placed to one side, performed pelvic tilts toward/away hands (performed on both sides).  Pt needed min  cueing/facilitation.  In standing, functional wt. Bearing through BUEs with wt. Shift to the Left and body on arm movements RUE functional reach with min cues/facilitation.  In standing at counter, reaching with RUE with L elbow on counter to retrieve/replace objects in overhead shelf with min A initially for L wt. Shift then close supervision.  Then, wiping table with BUEs (hand-over-hand) with close supervision.  Using L hand as a stabilizer to open bottles with min A/cueing initially then mod I.                             OT Education - 11/12/16 1634    Education Details Ways to incoporate LUE functionally into ADLs   Person(s) Educated Patient   Methods Explanation;Demonstration;Handout;Verbal cues   Comprehension Verbalized understanding;Returned demonstration;Verbal cues required          OT Short Term Goals - 10/26/16 1511      OT SHORT TERM GOAL #1   Title Pt/ caregiver will be I with HEP.   Time 6   Period Weeks   Status Achieved     OT SHORT TERM GOAL #2   Title Pt/ family will verbalize understanding of LUE positioning to minimize pain and risk for injury, including splinting PRN.   Time 6   Period  Weeks   Status Achieved  10/01/16, 10/26/16     OT SHORT TERM GOAL #3   Title Pt will donn shirt with min A.   Time 6   Period Weeks   Status Achieved  10/01/16  mod I     OT SHORT TERM GOAL #4   Title Pt will demonstrate 90* P/ROM shoulder flexion in LUE with pain less than or equal to 3/10.   Time 6   Period Weeks   Status Achieved  10/06/16 met to 90* with no pain     OT SHORT TERM GOAL #5   Title Pt will perform simple snack prep modified independently.   Time 6   Period Weeks   Status On-going  10/08/16  not performing as she primarily uses hemi-walker and can't carry anything in LUE.;  10/26/16  made coffee     OT SHORT TERM GOAL #6   Title Pt will perform bathing at a supervision level   Time 6   Period Weeks   Status  Achieved  10/01/16 met per pt report           OT Long Term Goals - 11/12/16 1639      OT LONG TERM GOAL #1   Title Pt will perform LB dressing with min A.   Time 12   Period Weeks   Status Achieved  10/01/16:  needs A for L shoe/brace     OT LONG TERM GOAL #2   Title Pt will use LUE as a stabilizer/ gross A with min A.   Time 12   Period Weeks   Status On-going     OT LONG TERM GOAL #3   Title Pt will perform simple home management tasks with min A.   Time 6   Period Weeks   Status On-going     OT LONG TERM GOAL #4   Title Pt will demonstrate adequate standing balance to retrieve a light weight object from overhead cabinet modified independently with RUE without LOB in prep for IADLS.   Time 12   Period Weeks   Status On-going     OT LONG TERM GOAL #5   Title Pt will perform shower transfers modified independently   Time 12   Period Weeks   Status New               Plan - 11/12/16 1426    Clinical Impression Statement Pt is progressing slowly towards with L side awareness/activation.     Rehab Potential Good   Clinical Impairments Affecting Rehab Potential severity of deficits; cognitive deficits   OT Frequency 2x / week   OT Duration 12 weeks   OT Treatment/Interventions Self-care/ADL training;Moist Heat;Fluidtherapy;DME and/or AE instruction;Splinting;Patient/family education;Balance Financial planner;Contrast Bath;Ultrasound;Therapeutic exercise;Therapeutic activities;Cognitive remediation/compensation;Passive range of motion;Functional Mobility Training;Neuromuscular education;Cryotherapy;Electrical Stimulation;Parrafin;Energy conservation;Manual Therapy;Visual/perceptual remediation/compensation   Plan neuro re-ed   OT Home Exercise Plan 09/07/16:  resting splint wear/care   Consulted and Agree with Plan of Care Patient      Patient will benefit from skilled therapeutic intervention in order to improve the following deficits and impairments:   Abnormal gait, Decreased coordination, Decreased range of motion, Difficulty walking, Impaired flexibility, Impaired sensation, Increased edema, Decreased safety awareness, Decreased endurance, Decreased activity tolerance, Decreased knowledge of precautions, Increased fascial restricitons, Impaired tone, Pain, Impaired UE functional use, Decreased knowledge of use of DME, Decreased balance, Decreased cognition, Decreased mobility, Decreased strength, Impaired perceived functional ability, Impaired vision/preception  Visit Diagnosis: Hemiplegia and hemiparesis following cerebral  infarction affecting left dominant side (HCC)  Other lack of coordination  Other abnormalities of gait and mobility  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 Left flaccid hemiplegia 06/24/2016  . Ear itching   . Poor fluid intake   . Bradycardia, drug induced   . Poor nutrition   . Muscle spasticity   . Obesity (BMI 30.0-34.9) 04/08/2016  . Hyperlipidemia 04/08/2016  . Basal ganglia hemorrhage ([place]) 04/08/2016  . Aphasia as late effect of cerebrovascular accident   . Dysphagia as late effect of cerebrovascular disease   . Migraine with aura and without status migrainosus, not intractable   . HLD (hyperlipidemia)   . Benign essential HTN   . Gait disturbance, post-stroke   . Hemiplegia, post-stroke ([place])   . Hypokalemia   . Dysphagia, post-stroke   . Aphasia, post-stroke   . Bradycardia   . Headache, migraine   . Dysarthria, post-stroke   . Encephalopathy acute 04/02/2016  . ICH (intracerebral hemorrhage) (HCC) - R basal ganglia due to hypertensive emergency 04/01/2016  . Upper airway cough syndrome 12/06/2015  . PCP NOTES >>> 09/25/2015  . Need for hepatitis B vaccination 10/19/2014  . Insomnia 12/28/2012  . Annual physical exam 11/23/2011  . Hemorrhoids  10/13/2011  . Morbid obesity ([place]) 02/26/2011  . CHEST PAIN UNSPECIFIED 03/28/2010  . Essential hypertension 10/19/2007  . Headache(784.0) 05/12/2007    [facility] 11/12/2016, 4:39 PM  [facility] [facility] [address] [place], [place], [address] Phone: [phone]   Fax:  [phone]  Name: Abigail Campos MRN: [id number] Date of Birth: 1971/03/10   Vianne Bulls, OTR/L [facility] [address] [place],   [address] [phone] phone [phone] 11/12/16 4:41 PM

## 2016-11-12 NOTE — Therapy (Signed)
[place] [address] [place], [place], [address] Phone: [phone]   Fax:  [phone]  Physical Therapy Treatment  Patient Details  Name: Abigail Campos MRN: [id number] Date of Birth: 1971-07-06 Referring Provider: Dr. Posey Pronto  Encounter Date: 11/12/2016      PT End of Session - 11/12/16 1411    Visit Number 19   Number of Visits 25, plus 8 additional visits for new cert   Date for PT Re-Evaluation 12/09/16   Authorization Type [employer]-240 visit limit (including HH and OP). Per Lattie Haw, pt used 24 total HHPT, [place], and [place] visits.   PT Start Time 1318  pt using restroom prior to beginning session   PT Stop Time 1358   PT Time Calculation (min) 40 min   Equipment Utilized During Treatment Gait belt   Activity Tolerance Patient tolerated treatment well   Behavior During Therapy WFL for tasks assessed/performed      Past Medical History:  Diagnosis Date  . Essential hypertension 10/19/2007   03-2010: metoprolol changed to bystolic (was not feeling well on it, no specific allergy or reaction)    . Hypertension   . Stroke [facility])     Past Surgical History:  Procedure Laterality Date  . ABDOMINAL HYSTERECTOMY  11-14-07   no oophorectomy per surgical report  . CESAREAN SECTION     [id number]  . INGUINAL HERNIA REPAIR     2004    There were no vitals filed for this visit.      Subjective Assessment - 11/12/16 1322    Subjective Pt denied falls or changes since last visit.    Pertinent History HTN, hyperlipidemia, hx of migraine   Patient Stated Goals I want to walk and stand up without falling.    Currently in Pain? No/denies                         [facility] Adult PT Treatment/Exercise - 11/12/16 1323      Ambulation/Gait   Ambulation/Gait Yes   Ambulation/Gait Assistance 6: Modified independent (Device/Increase time);5: Supervision;4: Min guard   Ambulation/Gait Assistance Details  Pt required min guard during amb. in // bars with SPC and min guard to S during amb. with SBQC to ensure safety. Pt progressed from S over even terrain with [facility] to MOD I, but required S over uneven terrain (inclines/declines). Cues to improve L LE stance time and L knee ext during midstance, to improve R step length and cues to improve upright posture.    Ambulation Distance (Feet) 50 Feet  x2, 115'x2, 50'x2 outdoors, 4x7' in // bars c SPC   Assistive device Large base quad cane;Small based quad cane;Straight cane   Gait Pattern Decreased stride length;Decreased stance time - left;Decreased step length - right;Decreased dorsiflexion - left;Left circumduction;Left genu recurvatum;Step-to pattern;Step-through pattern   Ambulation Surface Level;Unlevel;Indoor;Outdoor;Paved   Gait velocity 0.46f/sec.     Standardized Balance Assessment   Standardized Balance Assessment Timed Up and Go Test     Timed Up and Go Test   TUG Normal TUG   [facility] (seconds) 21.2  [facility]               PT Education - 11/12/16 1410    Education provided Yes   Education Details PT discussed goal progress and outcome measure results. PT discussed renewal with pt.    Person(s) Educated Patient   Methods Explanation   Comprehension Verbalized understanding  PT Short Term Goals - 09/17/16 1641      PT SHORT TERM GOAL #1   Title Pt will be IND in performing HEP to improve strength and balance. TARGET DATE FOR ALL STGS: 09/08/16   Status Partially Met     PT SHORT TERM GOAL #2   Title Perform BERG and write goals if appropriate.   Status Achieved     PT SHORT TERM GOAL #3   Title Pt will amb. 200' over even terrain with LRAD at MOD I level to improve functional mobility.    Status Partially Met     PT SHORT TERM GOAL #4   Title Pt will improve gait speed to 0.6f/sec with LRAD to decrease falls risk.    Status Not Met     PT SHORT TERM GOAL #5   Title Pt will improve BERG score to  >/=21/56 to decr. falls risk.    Status Achieved           PT Long Term Goals - 11/12/16 1413      PT LONG TERM GOAL #1   Title Pt will verbalize understanding or CVA risk factors and signs/symptoms to reduce risk of additional CVA. TARGET DATE FOR ALL LTGS: 12/09/16   Baseline ALL UNMET GOALS WILL BE CARRIED OVER TO NEW POC: 12/09/16   Status Achieved     PT LONG TERM GOAL #2   Title Pt will improve gait speed to >/=1.857fsec. with LRAD to reduce falls risk.    Status Partially Met     PT LONG TERM GOAL #3   Title Pt will amb. 400' over even/paved surfaces with LRAD at MOD I level to improve functional mobility.    Status Partially Met     PT LONG TERM GOAL #4   Title Pt will perform TUG with LRAD in </=13.5sec. to improve function and decr. falls risk.    Baseline Met on 11/12/16: 21.2 sec. with LBQC, therefore revised.   Status Revised     PT LONG TERM GOAL #5   Title Pt will perform stand pivot txfs from chair to w/c at MOD I level to improve functional mobility.    Status Achieved     PT LONG TERM GOAL #6   Title Pt will improve BERG score to >/=48/56 to decr. falls risk.    Baseline 42/56 on 11/03/16, therefore revised.   Status Revised               Plan - 11/12/16 1411    Clinical Impression Statement Pt met LTG 4 and partially met LTG 2. Pt would continue to benefit from skilled PT to improve IND and safety during functional mobility. PT requesting addtional 2x/week for 4 weeks to improve strength, balance, gait deviations and flexibility.    Rehab Potential Good   Clinical Impairments Affecting Rehab Potential co-morbidities   PT Frequency 2x / week   PT Duration 4 weeks  original POC 2x/week for 8 weeks   PT Treatment/Interventions ADLs/Self Care Home Management;Biofeedback;Canalith Repostioning;Electrical Stimulation;Cognitive remediation;Neuromuscular re-education;Balance training;Therapeutic exercise;Therapeutic activities;Functional mobility  training;Stair training;Gait training;DME Instruction;Orthotic Fit/Training;Patient/family education;Vestibular;Manual techniques   PT Next Visit Plan LLE strength training, continue gait training with LBQC over uneven surfaces and ramps/curbs.   PT Home Exercise Plan Strengthening HEP.    Consulted and Agree with Plan of Care Patient;Family member/caregiver   Family Member Consulted pt's mom: [name]    Patient will benefit from skilled therapeutic intervention in order to improve the following deficits  and impairments:  Decreased endurance, Decreased knowledge of use of DME, Decreased strength, Impaired UE functional use, Impaired sensation, Decreased balance, Decreased mobility, Decreased range of motion, Decreased coordination, Impaired flexibility, Decreased cognition, Abnormal gait  Visit Diagnosis: Hemiplegia and hemiparesis following cerebral infarction affecting left dominant side (HCC)  Other abnormalities of gait and mobility  Other lack of coordination     Problem List Patient Active Problem List   Diagnosis Date Noted  . Left flaccid hemiplegia 06/24/2016  . Ear itching   . Poor fluid intake   . Bradycardia, drug induced   . Poor nutrition   . Muscle spasticity   . Obesity (BMI 30.0-34.9) 04/08/2016  . Hyperlipidemia 04/08/2016  . Basal ganglia hemorrhage ([place]) 04/08/2016  . Aphasia as late effect of cerebrovascular accident   . Dysphagia as late effect of cerebrovascular disease   . Migraine with aura and without status migrainosus, not intractable   . HLD (hyperlipidemia)   . Benign essential HTN   . Gait disturbance, post-stroke   . Hemiplegia, post-stroke ([place])   . Hypokalemia   . Dysphagia, post-stroke   . Aphasia, post-stroke   . Bradycardia   . Headache, migraine   . Dysarthria, post-stroke   . Encephalopathy acute 04/02/2016  . ICH (intracerebral hemorrhage) (HCC) - R basal ganglia due to hypertensive emergency 04/01/2016  . Upper airway cough  syndrome 12/06/2015  . PCP NOTES >>> 09/25/2015  . Need for hepatitis B vaccination 10/19/2014  . Insomnia 12/28/2012  . Annual physical exam 11/23/2011  . Hemorrhoids 10/13/2011  . Morbid obesity ([place]) 02/26/2011  . CHEST PAIN UNSPECIFIED 03/28/2010  . Essential hypertension 10/19/2007  . Headache(784.0) 05/12/2007    Abigail Campos L 11/12/2016, 2:15 PM  [facility] [address] [place], [place], [address] Phone: [phone]   Fax:  [phone]  Name: Abigail Campos MRN: [id number] Date of Birth: 1971/11/03  Geoffry Paradise, PT,DPT 11/12/16 2:16 PM Phone: [phone] Fax: [phone]

## 2016-11-12 NOTE — Addendum Note (Signed)
Addended by: Elza Rafter on: 11/12/2016 02:19 PM   Modules accepted: Orders

## 2016-11-17 ENCOUNTER — Encounter: Payer: Self-pay | Admitting: Occupational Therapy

## 2016-11-17 ENCOUNTER — Ambulatory Visit: Payer: Managed Care, Other (non HMO) | Attending: Physical Medicine & Rehabilitation | Admitting: Occupational Therapy

## 2016-11-17 ENCOUNTER — Ambulatory Visit: Payer: Managed Care, Other (non HMO) | Admitting: Speech Pathology

## 2016-11-17 DIAGNOSIS — I69115 Cognitive social or emotional deficit following nontraumatic intracerebral hemorrhage: Secondary | ICD-10-CM | POA: Diagnosis present

## 2016-11-17 DIAGNOSIS — R41841 Cognitive communication deficit: Secondary | ICD-10-CM | POA: Insufficient documentation

## 2016-11-17 DIAGNOSIS — I69352 Hemiplegia and hemiparesis following cerebral infarction affecting left dominant side: Secondary | ICD-10-CM | POA: Diagnosis present

## 2016-11-17 DIAGNOSIS — R471 Dysarthria and anarthria: Secondary | ICD-10-CM | POA: Diagnosis present

## 2016-11-17 DIAGNOSIS — R278 Other lack of coordination: Secondary | ICD-10-CM | POA: Diagnosis present

## 2016-11-17 DIAGNOSIS — R2689 Other abnormalities of gait and mobility: Secondary | ICD-10-CM | POA: Diagnosis present

## 2016-11-17 DIAGNOSIS — R2681 Unsteadiness on feet: Secondary | ICD-10-CM | POA: Diagnosis present

## 2016-11-17 DIAGNOSIS — M25512 Pain in left shoulder: Secondary | ICD-10-CM | POA: Diagnosis present

## 2016-11-17 DIAGNOSIS — I69015 Cognitive social or emotional deficit following nontraumatic subarachnoid hemorrhage: Secondary | ICD-10-CM

## 2016-11-17 NOTE — Therapy (Signed)
[facility] [address] [place], [place], [address] Phone: [phone]   Fax:  [phone]  Occupational Therapy Treatment  Patient Details  Name: Abigail Campos MRN: [id number] Date of Birth: Jul 21, 1971 Referring Provider: Dr. Posey Pronto  Encounter Date: 11/17/2016      OT End of Session - 11/17/16 1412    Visit Number 18   Number of Visits 25   Date for OT Re-Evaluation 12/11/16   Authorization Type [employer] (240 visit limit combined with home health, estimated HH used 24 combined), no auth required    Authorization Time Period wk 10/12   OT Start Time 1318   OT Stop Time 1400   OT Time Calculation (min) 42 min   Activity Tolerance Patient tolerated treatment well   Behavior During Therapy [facility] for tasks assessed/performed      Past Medical History:  Diagnosis Date  . Essential hypertension 10/19/2007   03-2010: metoprolol changed to bystolic (was not feeling well on it, no specific allergy or reaction)    . Hypertension   . Stroke [facility])     Past Surgical History:  Procedure Laterality Date  . ABDOMINAL HYSTERECTOMY  11-14-07   no oophorectomy per surgical report  . CESAREAN SECTION     [id number]  . INGUINAL HERNIA REPAIR     2004    There were no vitals filed for this visit.      Subjective Assessment - 11/17/16 1334    Subjective  Patient tearful this afternoon    Pertinent History see Epic   Patient Stated Goals to be able to use left arm   Currently in Pain? No/denies   Pain Score 0-No pain                      OT Treatments/Exercises (OP) - 11/17/16 0001      Neurological Re-education Exercises   Other Exercises 1 Worked in sidelying to allow patient to see left arm in supported postion.  Worked first on isolated control of wrist, forearm, and elbow.  Worked on active elbow flexion, and active relaxation of elbow to obtain elbow extension.  Addressed isolated thumb motion.   Patient able to make contact with 1 inch block with left thumb and index finger (mass grasp pattern versus isoalted pinch control) and bring block toward mouth and back (forearm pronation with elbow flexion, forearm supination with elbow extension) Patient able to passively release 1 inch block with realxation of fingers and passive wrist flexion.     Other Exercises 2 Sidelying to sitting transition - incorporating a push into surface with left arm to help with transition.                  OT Education - 11/17/16 1411    Education provided Yes   Education Details importance of being able to see extremity with all attempts at functional movement.  The benefits of repetition of movement pattenrs   Person(s) Educated Patient   Methods Explanation;Demonstration;Verbal cues;Tactile cues   Comprehension Verbalized understanding;Returned demonstration;Need further instruction          OT Short Term Goals - 10/26/16 1511      OT SHORT TERM GOAL #1   Title Pt/ caregiver will be I with HEP.   Time 6   Period Weeks   Status Achieved     OT SHORT TERM GOAL #2   Title Pt/ family will verbalize understanding of LUE positioning to minimize pain and  risk for injury, including splinting PRN.   Time 6   Period Weeks   Status Achieved  10/01/16, 10/26/16     OT SHORT TERM GOAL #3   Title Pt will donn shirt with min A.   Time 6   Period Weeks   Status Achieved  10/01/16  mod I     OT SHORT TERM GOAL #4   Title Pt will demonstrate 90* P/ROM shoulder flexion in LUE with pain less than or equal to 3/10.   Time 6   Period Weeks   Status Achieved  10/06/16 met to 90* with no pain     OT SHORT TERM GOAL #5   Title Pt will perform simple snack prep modified independently.   Time 6   Period Weeks   Status On-going  10/08/16  not performing as she primarily uses hemi-walker and can't carry anything in LUE.;  10/26/16  made coffee     OT SHORT TERM GOAL #6   Title Pt will perform  bathing at a supervision level   Time 6   Period Weeks   Status Achieved  10/01/16 met per pt report           OT Long Term Goals - 11/12/16 1639      OT LONG TERM GOAL #1   Title Pt will perform LB dressing with min A.   Time 12   Period Weeks   Status Achieved  10/01/16:  needs A for L shoe/brace     OT LONG TERM GOAL #2   Title Pt will use LUE as a stabilizer/ gross A with min A.   Time 12   Period Weeks   Status On-going     OT LONG TERM GOAL #3   Title Pt will perform simple home management tasks with min A.   Time 6   Period Weeks   Status On-going     OT LONG TERM GOAL #4   Title Pt will demonstrate adequate standing balance to retrieve a light weight object from overhead cabinet modified independently with RUE without LOB in prep for IADLS.   Time 12   Period Weeks   Status On-going     OT LONG TERM GOAL #5   Title Pt will perform shower transfers modified independently   Time 12   Period Weeks   Status New               Plan - 11/17/16 1413    Clinical Impression Statement Patient is showing improved awareness of left side.  Patient with increased muscle tension in flexor synergy pattenr in left UE.     Rehab Potential Good   Clinical Impairments Affecting Rehab Potential severity of deficits; cognitive deficits   OT Frequency 2x / week   OT Duration 12 weeks   OT Treatment/Interventions Self-care/ADL training;Moist Heat;Fluidtherapy;DME and/or AE instruction;Splinting;Patient/family education;Balance Financial planner;Contrast Bath;Ultrasound;Therapeutic exercise;Therapeutic activities;Cognitive remediation/compensation;Passive range of motion;Functional Mobility Training;Neuromuscular education;Cryotherapy;Electrical Stimulation;Parrafin;Energy conservation;Manual Therapy;Visual/perceptual remediation/compensation   Plan NMR LUE - use of LUE in functional conditions   OT Home Exercise Plan 09/07/16:  resting splint wear/care   Consulted and  Agree with Plan of Care Patient      Patient will benefit from skilled therapeutic intervention in order to improve the following deficits and impairments:  Abnormal gait, Decreased coordination, Decreased range of motion, Difficulty walking, Impaired flexibility, Impaired sensation, Increased edema, Decreased safety awareness, Decreased endurance, Decreased activity tolerance, Decreased knowledge of precautions, Increased fascial restricitons, Impaired tone, Pain,  Impaired UE functional use, Decreased knowledge of use of DME, Decreased balance, Decreased cognition, Decreased mobility, Decreased strength, Impaired perceived functional ability, Impaired vision/preception  Visit Diagnosis: Hemiplegia and hemiparesis following cerebral infarction affecting left dominant side (HCC)  Other lack of coordination  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 Left flaccid hemiplegia 06/24/2016  . Ear itching   . Poor fluid intake   . Bradycardia, drug induced   . Poor nutrition   . Muscle spasticity   . Obesity (BMI 30.0-34.9) 04/08/2016  . Hyperlipidemia 04/08/2016  . Basal ganglia hemorrhage ([place]) 04/08/2016  . Aphasia as late effect of cerebrovascular accident   . Dysphagia as late effect of cerebrovascular disease   . Migraine with aura and without status migrainosus, not intractable   . HLD (hyperlipidemia)   . Benign essential HTN   . Gait disturbance, post-stroke   . Hemiplegia, post-stroke ([place])   . Hypokalemia   . Dysphagia, post-stroke   . Aphasia, post-stroke   . Bradycardia   . Headache, migraine   . Dysarthria, post-stroke   . Encephalopathy acute 04/02/2016  . ICH (intracerebral hemorrhage) (HCC) - R basal ganglia due to hypertensive emergency 04/01/2016  . Upper airway cough syndrome 12/06/2015  . PCP  NOTES >>> 09/25/2015  . Need for hepatitis B vaccination 10/19/2014  . Insomnia 12/28/2012  . Annual physical exam 11/23/2011  . Hemorrhoids 10/13/2011  . Morbid obesity ([place]) 02/26/2011  . CHEST PAIN UNSPECIFIED 03/28/2010  . Essential hypertension 10/19/2007  . Headache(784.0) 05/12/2007    Mariah Milling 11/17/2016, 2:16 PM  [facility] [address] [place], [place], [address] Phone: [phone]   Fax:  [phone]  Name: Abigail Campos MRN: [id number] Date of Birth: 05/20/1971

## 2016-11-17 NOTE — Therapy (Addendum)
[facility] [address] [place], [place], [address] Phone: [phone]   Fax:  [phone]  Speech Language Pathology Treatment  Patient Details  Name: Abigail Campos MRN: [id number] Date of Birth: 1971/03/09 Referring Provider: Delice Lesch, M.D.  Encounter Date: 11/17/2016      End of Session - 11/17/16 1502    Visit Number 19   Number of Visits 32   Date for SLP Re-Evaluation 01/08/17   SLP Start Time [id number]   SLP Stop Time  [id number]   SLP Time Calculation (min) 42 min      Past Medical History:  Diagnosis Date  . Essential hypertension 10/19/2007   03-2010: metoprolol changed to bystolic (was not feeling well on it, no specific allergy or reaction)    . Hypertension   . Stroke [facility])     Past Surgical History:  Procedure Laterality Date  . ABDOMINAL HYSTERECTOMY  11-14-07   no oophorectomy per surgical report  . CESAREAN SECTION     [id number]  . INGUINAL HERNIA REPAIR     2004    There were no vitals filed for this visit.      Subjective Assessment - 11/17/16 1404    Subjective "I haven't seen Glendell Docker in a long long time"               ADULT SLP TREATMENT - 11/17/16 1404      General Information   Behavior/Cognition Alert;Cooperative;Pleasant mood     Treatment Provided   Treatment provided Cognitive-Linquistic     Pain Assessment   Pain Assessment No/denies pain     Cognitive-Linquistic Treatment   Treatment focused on Cognition   Skilled Treatment Facilitated organization of reasoning and functional word problems with usual mod A for organization and reasoning. Error awareness with occasional min A.       Assessment / Recommendations / Plan   Plan Continue with current plan of care     Progression Toward Goals   Progression toward goals Progressing toward goals          SLP Education - 11/17/16 1458    Education provided Yes   Education Details Areas of impairment for  awareness           SLP Short Term Goals - 11/17/16 1501      SLP SHORT TERM GOAL #1   Title pt will name 3 deficit areas independentlly, in two sessions   Baseline goal renewed/modified week of 10-26-16   Time 2   Period Weeks   Status On-going     SLP SHORT TERM GOAL #2   Title pt will demo selective attention to therapy tasks for 10 minutes in min-mod noisy environment   Status Achieved     SLP SHORT TERM GOAL #3   Title pt will complete tasks of simple cognitive linguistic organization with 90% success and rare min A   Status Achieved     SLP SHORT TERM GOAL #4   Title pt will attempt to self correct misarticulated speech in 50% of opportunities in structured speech tasks   Baseline goal renewed week of 10-26-16   Time 2   Period Weeks   Status On-going          SLP Long Term Goals - 11/17/16 1501      SLP LONG TERM GOAL #1   Title pt will demo alternating attention in simple cognitive linguistic tasks with appropriate switch times, with compensations   Status Achieved  SLP LONG TERM GOAL #2   Title pt will demo emergent awareness in cognitive linguistic tasks by self correcting errors in 80% of opportunities with nonverbal cues   Time 6   Period Weeks   Status On-going     SLP LONG TERM GOAL #3   Title pt will demo functional organization in mod complex linguistic tasks with modified independence   Time 6   Period Weeks   Status On-going     SLP LONG TERM GOAL #4   Title pt will demo rate of speech in 5 minutes appropriate for 95-99% intelligibility in simple to mod complex conversation with occasional verbal cues   Time 6   Period Weeks   Status On-going     SLP LONG TERM GOAL #5   Title pt will demo knowledge of fast speech rate decreasing intelligibility when in conversation and will self correct 90% of the time   Time 6   Period Weeks   Status On-going  due to cognitive ling goals     SLP LONG TERM GOAL #6   Title pt will demo            Plan - 11/17/16 1459    Clinical Impression Statement Pt required mod A for organizing reasoning and functional math word problems. All cognitive tasks required extended time, likely slower processing. Pt continues to require min A for error awareness. Continue skilld ST to maximize cognition and intelligibility for improved indepedence.   Speech Therapy Frequency 2x / week   Treatment/Interventions Compensatory strategies;Patient/family education;Functional tasks;Cueing hierarchy;Cognitive reorganization;Internal/external aids;SLP instruction and feedback;Oral motor exercises   Potential to Achieve Goals Good   Potential Considerations Severity of impairments   Consulted and Agree with Plan of Care Patient      Patient will benefit from skilled therapeutic intervention in order to improve the following deficits and impairments:   Cognitive social or emotional deficit following nontraumatic subarachnoid hemorrhage    Problem List Patient Active Problem List   Diagnosis Date Noted  . Left flaccid hemiplegia 06/24/2016  . Ear itching   . Poor fluid intake   . Bradycardia, drug induced   . Poor nutrition   . Muscle spasticity   . Obesity (BMI 30.0-34.9) 04/08/2016  . Hyperlipidemia 04/08/2016  . Basal ganglia hemorrhage ([place]) 04/08/2016  . Aphasia as late effect of cerebrovascular accident   . Dysphagia as late effect of cerebrovascular disease   . Migraine with aura and without status migrainosus, not intractable   . HLD (hyperlipidemia)   . Benign essential HTN   . Gait disturbance, post-stroke   . Hemiplegia, post-stroke ([place])   . Hypokalemia   . Dysphagia, post-stroke   . Aphasia, post-stroke   . Bradycardia   . Headache, migraine   . Dysarthria, post-stroke   . Encephalopathy acute 04/02/2016  . ICH (intracerebral hemorrhage) (HCC) - R basal ganglia due to hypertensive emergency 04/01/2016  . Upper airway cough syndrome 12/06/2015  . PCP NOTES >>> 09/25/2015  . Need  for hepatitis B vaccination 10/19/2014  . Insomnia 12/28/2012  . Annual physical exam 11/23/2011  . Hemorrhoids 10/13/2011  . Morbid obesity ([place]) 02/26/2011  . CHEST PAIN UNSPECIFIED 03/28/2010  . Essential hypertension 10/19/2007  . Headache(784.0) 05/12/2007    Lovvorn, Annye Rusk MS, CCC-SLP 11/17/2016, 3:03 PM  [facility] [address] [place], [place], [address] Phone: [phone]   Fax:  [phone]   Name: JADORE PARSELLS MRN: [id number] Date of Birth: June 19, 1971

## 2016-11-27 ENCOUNTER — Ambulatory Visit: Payer: Managed Care, Other (non HMO) | Admitting: Physical Therapy

## 2016-11-27 ENCOUNTER — Ambulatory Visit: Payer: Managed Care, Other (non HMO)

## 2016-11-27 ENCOUNTER — Encounter: Payer: Self-pay | Admitting: Physical Therapy

## 2016-11-27 DIAGNOSIS — I69352 Hemiplegia and hemiparesis following cerebral infarction affecting left dominant side: Secondary | ICD-10-CM | POA: Diagnosis not present

## 2016-11-27 DIAGNOSIS — R2689 Other abnormalities of gait and mobility: Secondary | ICD-10-CM

## 2016-11-27 DIAGNOSIS — R278 Other lack of coordination: Secondary | ICD-10-CM

## 2016-11-29 ENCOUNTER — Other Ambulatory Visit: Payer: Self-pay | Admitting: Internal Medicine

## 2016-11-29 NOTE — Therapy (Signed)
[facility] [address] [place], [place], [address] Phone: [phone]   Fax:  [phone]  Physical Therapy Treatment  Patient Details  Name: Abigail Campos MRN: [id number] Date of Birth: June 17, 1971 Referring Provider: Dr. Posey Pronto  Encounter Date: 11/27/2016   11/27/16 1237  PT Visits / Re-Eval  Visit Number 20  Number of Visits 25  Date for PT Re-Evaluation 12/09/16  Authorization  Authorization Type [employer]-240 visit limit (including HH and OP). Per Lattie Haw, pt used 24 total HHPT, [place], and [place] visits.  PT Time Calculation  PT Start Time 1233  PT Stop Time 1315  PT Time Calculation (min) 42 min  PT - End of Session  Equipment Utilized During Treatment Gait belt  Activity Tolerance Patient tolerated treatment well  Behavior During Therapy WFL for tasks assessed/performed     Past Medical History:  Diagnosis Date  . Essential hypertension 10/19/2007   03-2010: metoprolol changed to bystolic (was not feeling well on it, no specific allergy or reaction)    . Hypertension   . Stroke [facility])     Past Surgical History:  Procedure Laterality Date  . ABDOMINAL HYSTERECTOMY  11-14-07   no oophorectomy per surgical report  . CESAREAN SECTION     [id number]  . INGUINAL HERNIA REPAIR     2004    There were no vitals filed for this visit.     11/27/16 1237  Symptoms/Limitations  Subjective No new complaints. No falls or pain to report.   Patient is accompained by: Family member (mom in lobby)  Pertinent History HTN, hyperlipidemia, hx of migraine  Patient Stated Goals I want to walk and stand up without falling.   Pain Assessment  Currently in Pain? No/denies  Pain Score 0       11/27/16 1238  Transfers  Transfers Sit to Stand;Stand to Sit;Stand Pivot Transfers  Sit to Stand 5: Supervision;With upper extremity assist;From bed;From chair/3-in-1  Stand to Sit 5: Supervision;With upper extremity  assist;To bed;To chair/3-in-1  Number of Reps 10 reps;Other sets (comment) (3 sets)  Transfer Cueing cues/facilitation for left LE weight shifting and midline positioning with transfers  Comments performed these 10 reps in addition to sit<>stand transfers performed in session with gait and other activities. had pt's right foot on 4 inch box to facilitate increased left LE use, used mirror to assist with increased left LE use/weight shifting and maintaining midline position with sit<>stand transfer. cues/facilitation needed for all of these.                             Ambulation/Gait  Ambulation/Gait Yes  Ambulation/Gait Assistance 4: Min guard;5: Supervision  Ambulation/Gait Assistance Details cues/facilitation for lateral weight shifting ,cues on increased right step length to progress from step to gait pattern to a step through gait pattern.   Ambulation Distance (Feet) 205 Feet (x1, 220 x1)  Assistive device Large base quad cane  Gait Pattern Step-to pattern;Step-through pattern;Decreased stance time - left;Decreased step length - right;Decreased hip/knee flexion - left;Left genu recurvatum;Left flexed knee in stance;Narrow base of support  Ambulation Surface Level;Indoor  Neuro Re-ed   Neuro Re-ed Details  for Left LE strengthening/promoting increased Left LE weight bearing: standing with right foot on 4 inch step, mini squats x 10 reps for 2 sets with emphasis on staying in midline position with each rep and on increased left LE activiation with return to standing position; left stance: right foot  taps to 4 inch box with cues/assist for left lateral weight shifitng, x 10 reps. min to mod assist with each of these with no UE support;  tall kneeling on mat with UE's on blue p-roll: partial sit backs x 10 reps with cues/faciltation for midline positining/emphasis on midline and equal LE weight bearing.                                                           PT Short Term Goals - 09/17/16  1641      PT SHORT TERM GOAL #1   Title Pt will be IND in performing HEP to improve strength and balance. TARGET DATE FOR ALL STGS: 09/08/16   Status Partially Met     PT SHORT TERM GOAL #2   Title Perform BERG and write goals if appropriate.   Status Achieved     PT SHORT TERM GOAL #3   Title Pt will amb. 200' over even terrain with LRAD at MOD I level to improve functional mobility.    Status Partially Met     PT SHORT TERM GOAL #4   Title Pt will improve gait speed to 0.13f/sec with LRAD to decrease falls risk.    Status Not Met     PT SHORT TERM GOAL #5   Title Pt will improve BERG score to >/=21/56 to decr. falls risk.    Status Achieved           PT Long Term Goals - 11/12/16 1413      PT LONG TERM GOAL #1   Title Pt will verbalize understanding or CVA risk factors and signs/symptoms to reduce risk of additional CVA. TARGET DATE FOR ALL LTGS: 12/09/16   Baseline ALL UNMET GOALS WILL BE CARRIED OVER TO NEW POC: 12/09/16   Status Achieved     PT LONG TERM GOAL #2   Title Pt will improve gait speed to >/=1.857fsec. with LRAD to reduce falls risk.    Status Partially Met     PT LONG TERM GOAL #3   Title Pt will amb. 400' over even/paved surfaces with LRAD at MOD I level to improve functional mobility.    Status Partially Met     PT LONG TERM GOAL #4   Title Pt will perform TUG with LRAD in </=13.5sec. to improve function and decr. falls risk.    Baseline Met on 11/12/16: 21.2 sec. with LBQC, therefore revised.   Status Revised     PT LONG TERM GOAL #5   Title Pt will perform stand pivot txfs from chair to w/c at MOD I level to improve functional mobility.    Status Achieved     PT LONG TERM GOAL #6   Title Pt will improve BERG score to >/=48/56 to decr. falls risk.    Baseline 42/56 on 11/03/16, therefore revised.   Status Revised        11/27/16 1238  Plan  Clinical Impression Statement Today's skilled session continued to focuse on gait with [facility] on  left LE strengthening/weight bearing with stance/activity. No issues reported with session. Pt continues to demo decreased weight bearing/shifting onto left LE without cues/facilitation to do so. Pt is progressing towards goals and should benefit from continued PT to progress toward goals not met.  Pt will benefit from skilled therapeutic intervention in order to improve on the following deficits Decreased endurance;Decreased knowledge of use of DME;Decreased strength;Impaired UE functional use;Impaired sensation;Decreased balance;Decreased mobility;Decreased range of motion;Decreased coordination;Impaired flexibility;Decreased cognition;Abnormal gait  Rehab Potential Good  Clinical Impairments Affecting Rehab Potential co-morbidities  PT Frequency 2x / week  PT Duration 4 weeks (original POC 2x/week for 8 weeks)  PT Treatment/Interventions ADLs/Self Care Home Management;Biofeedback;Canalith Repostioning;Electrical Stimulation;Cognitive remediation;Neuromuscular re-education;Balance training;Therapeutic exercise;Therapeutic activities;Functional mobility training;Stair training;Gait training;DME Instruction;Orthotic Fit/Training;Patient/family education;Vestibular;Manual techniques  PT Next Visit Plan LLE strength training, continue gait training with LBQC over uneven surfaces and ramps/curbs.  PT Home Exercise Plan Strengthening HEP.   Consulted and Agree with Plan of Care Patient;Family member/caregiver  Family Member Consulted pt's mom: [name]      Patient will benefit from skilled therapeutic intervention in order to improve the following deficits and impairments:  Decreased endurance, Decreased knowledge of use of DME, Decreased strength, Impaired UE functional use, Impaired sensation, Decreased balance, Decreased mobility, Decreased range of motion, Decreased coordination, Impaired flexibility, Decreased cognition, Abnormal gait  Visit Diagnosis: Hemiplegia and hemiparesis  following cerebral infarction affecting left dominant side (HCC)  Other lack of coordination  Other abnormalities of gait and mobility     Problem List Patient Active Problem List   Diagnosis Date Noted  . Left flaccid hemiplegia 06/24/2016  . Ear itching   . Poor fluid intake   . Bradycardia, drug induced   . Poor nutrition   . Muscle spasticity   . Obesity (BMI 30.0-34.9) 04/08/2016  . Hyperlipidemia 04/08/2016  . Basal ganglia hemorrhage ([place]) 04/08/2016  . Aphasia as late effect of cerebrovascular accident   . Dysphagia as late effect of cerebrovascular disease   . Migraine with aura and without status migrainosus, not intractable   . HLD (hyperlipidemia)   . Benign essential HTN   . Gait disturbance, post-stroke   . Hemiplegia, post-stroke ([place])   . Hypokalemia   . Dysphagia, post-stroke   . Aphasia, post-stroke   . Bradycardia   . Headache, migraine   . Dysarthria, post-stroke   . Encephalopathy acute 04/02/2016  . ICH (intracerebral hemorrhage) (HCC) - R basal ganglia due to hypertensive emergency 04/01/2016  . Upper airway cough syndrome 12/06/2015  . PCP NOTES >>> 09/25/2015  . Need for hepatitis B vaccination 10/19/2014  . Insomnia 12/28/2012  . Annual physical exam 11/23/2011  . Hemorrhoids 10/13/2011  . Morbid obesity ([place]) 02/26/2011  . CHEST PAIN UNSPECIFIED 03/28/2010  . Essential hypertension 10/19/2007  . [id number](662.9) 05/12/2007    Willow Ora, PTA, [facility] [address], [place], [place] [address] [phone] 11/29/16, 9:32 PM   Name: Abigail Campos MRN: [id number] Date of Birth: 23-Sep-1971

## 2016-12-01 ENCOUNTER — Ambulatory Visit: Payer: Managed Care, Other (non HMO) | Admitting: Occupational Therapy

## 2016-12-01 ENCOUNTER — Ambulatory Visit: Payer: Managed Care, Other (non HMO)

## 2016-12-01 DIAGNOSIS — I69352 Hemiplegia and hemiparesis following cerebral infarction affecting left dominant side: Secondary | ICD-10-CM

## 2016-12-01 DIAGNOSIS — I69115 Cognitive social or emotional deficit following nontraumatic intracerebral hemorrhage: Secondary | ICD-10-CM

## 2016-12-01 DIAGNOSIS — R278 Other lack of coordination: Secondary | ICD-10-CM

## 2016-12-01 DIAGNOSIS — R2689 Other abnormalities of gait and mobility: Secondary | ICD-10-CM

## 2016-12-01 NOTE — Therapy (Signed)
[facility] [address] [place], [place], [address] Phone: [phone]   Fax:  [phone]  Physical Therapy Treatment  Patient Details  Name: Abigail Campos MRN: [id number] Date of Birth: [birth year] Referring Provider: Dr. Posey Pronto  Encounter Date: 12/01/2016      PT End of Session - 12/01/16 1448    Visit Number 21   Number of Visits 25   Date for PT Re-Evaluation 11/29/16, new POC: 01/28/17   Authorization Type [employer]-240 visit limit (including HH and OP). Per Lattie Haw, pt used 24 total HHPT, [place], and [place] visits.   PT Start Time 1402   PT Stop Time 1445   PT Time Calculation (min) 43 min   Equipment Utilized During Treatment Gait belt   Activity Tolerance Patient tolerated treatment well   Behavior During Therapy WFL for tasks assessed/performed      Past Medical History:  Diagnosis Date  . Essential hypertension 10/19/2007   03-2010: metoprolol changed to bystolic (was not feeling well on it, no specific allergy or reaction)    . Hypertension   . Stroke [facility])     Past Surgical History:  Procedure Laterality Date  . ABDOMINAL HYSTERECTOMY  11-14-07   no oophorectomy per surgical report  . CESAREAN SECTION     [id number]  . INGUINAL HERNIA REPAIR     2004    There were no vitals filed for this visit.      Subjective Assessment - 12/01/16 1406    Subjective Pt denied falls or changes since last visit.    Patient is accompained by: Family member   Pertinent History HTN, hyperlipidemia, hx of migraine   Patient Stated Goals I want to walk and stand up without falling.    Currently in Pain? No/denies                         [facility] Adult PT Treatment/Exercise - 12/01/16 1408      Ambulation/Gait   Ambulation/Gait Yes   Ambulation/Gait Assistance 5: Supervision   Ambulation/Gait Assistance Details Cues to improve LLE stance time and wt. bearing.    Ambulation Distance (Feet) 400  Feet  100'x2, 25'x2   Assistive device Large base quad cane   Gait Pattern Step-to pattern;Step-through pattern;Decreased stance time - left;Decreased step length - right;Decreased hip/knee flexion - left;Left genu recurvatum;Left flexed knee in stance;Narrow base of support  intermittent step through pattern   Ambulation Surface Level;Unlevel;Indoor;Outdoor;Paved   Gait velocity 0.67f/sec.  [facility]    Standardized Balance Assessment   Standardized Balance Assessment Timed Up and Go Test;Berg Balance Test     Berg Balance Test   Sit to Stand Able to stand without using hands and stabilize independently   Standing Unsupported Able to stand safely 2 minutes   Sitting with Back Unsupported but Feet Supported on Floor or Stool Able to sit safely and securely 2 minutes   Stand to Sit Sits safely with minimal use of hands   Transfers Able to transfer safely, minor use of hands   Standing Unsupported with Eyes Closed Able to stand 10 seconds with supervision   Standing Ubsupported with Feet Together Able to place feet together independently and stand 1 minute safely   From Standing, Reach Forward with Outstretched Arm Can reach forward >12 cm safely (5")   From Standing Position, Pick up Object from Floor Able to pick up shoe, needs supervision   From Standing Position, Turn to  Look Behind Over each Shoulder Looks behind one side only/other side shows less weight shift   Turn 360 Degrees Able to turn 360 degrees safely but slowly   Standing Unsupported, Alternately Place Feet on Step/Stool Able to complete >2 steps/needs minimal assist  completed 8 steps but required S after first 2 steps.   Standing Unsupported, One Foot in Front Able to take small step independently and hold 30 seconds   Standing on One Leg Able to lift leg independently and hold 5-10 seconds  RLE SLS   Total Score 44     Timed Up and Go Test   TUG Normal TUG   Normal TUG (seconds) 27.3  LBQC                 PT Education - 12/01/16 1447    Education provided Yes   Education Details PT discussed renewal and scheduling add'l visits. PT, OT, and Speech discussed adding more visits and are in agreement that pt would benefit from continued PT. PT reiterated the importance of consistent therapy to ensure maximal functional gains.  PT discussed goal progress.    Person(s) Educated Patient   Methods Explanation   Comprehension Verbalized understanding          PT Short Term Goals - 12/01/16 1450      PT SHORT TERM GOAL #1   Title Pt will be IND in performing progressed HEP to improve strength and balance. TARGET DATE FOR ALL STGS: 12/29/16   Status New     PT SHORT TERM GOAL #2   Title Pt will improve BERG score to >/=48/56 to decr. falls risk.    Status New     PT SHORT TERM GOAL #3   Title Pt will amb. 400' over even/uneven terrain with LRAD at MOD I level to improve functional mobility.    Status New     PT SHORT TERM GOAL #4   Title Pt will trial SciFit and treadmill and write goals if appropriate to reintegrate pt into community exercises.   Status New           PT Long Term Goals - 12/01/16 1452      PT LONG TERM GOAL #1   Title Pt will verablize plans to join fitness center to maintain gains made in PT. TARGET DATE FOR ALL LTGS: 01/26/17   Baseline ALL UNMET LTGS WILL BE CARRIED OVER TO NEW POC: 01/26/17   Status New     PT LONG TERM GOAL #2   Title Pt will improve gait speed to >/=1.72f/sec. with LRAD to reduce falls risk.    Baseline not met 12/01/16: 0.836fsec.   Status On-going     PT LONG TERM GOAL #3   Title Pt will amb. 400' over even/paved surfaces with LRAD at MOD I level to improve functional mobility.    Baseline partially met 12/01/16   Status On-going     PT LONG TERM GOAL #4   Title Pt will perform TUG with LRAD in </=13.5sec. to improve function and decr. falls risk.    Baseline Not met on 12/01/16: 27.3   Status On-going     PT LONG TERM GOAL #6    Title Pt will improve BERG score to >/=51/56 to decr. falls risk.    Baseline 44/56 on 12/01/16   Status On-going               Plan - 12/01/16 1449    Clinical Impression Statement Pt partially  met LTG 3 and 6, and did not meet LTG 2 or 4. Pt likely did not meet goals, as she took a few weeks off from therapy due to the holidays. Pt would benefit from skilled PT to improve safety and IND during functional mobility. PT requesting additional 2x/week for 8 weeks.    Rehab Potential Good   Clinical Impairments Affecting Rehab Potential co-morbidities   PT Frequency 2x / week   PT Duration 8 weeks  original POC 2x/week for 8 weeks   PT Treatment/Interventions ADLs/Self Care Home Management;Biofeedback;Canalith Repostioning;Electrical Stimulation;Cognitive remediation;Neuromuscular re-education;Balance training;Therapeutic exercise;Therapeutic activities;Functional mobility training;Stair training;Gait training;DME Instruction;Orthotic Fit/Training;Patient/family education;Vestibular;Manual techniques   PT Next Visit Plan Trial SciFit and treadmill.   PT Home Exercise Plan Strengthening HEP.    Consulted and Agree with Plan of Care Patient;Family member/caregiver   Family Member Consulted pt's mom: [name]      Patient will benefit from skilled therapeutic intervention in order to improve the following deficits and impairments:  Decreased endurance, Decreased knowledge of use of DME, Decreased strength, Impaired UE functional use, Impaired sensation, Decreased balance, Decreased mobility, Decreased range of motion, Decreased coordination, Impaired flexibility, Decreased cognition, Abnormal gait  Visit Diagnosis: Hemiplegia and hemiparesis following cerebral infarction affecting left dominant side ([place]) - Plan: PT plan of care cert/re-cert  Other lack of coordination - Plan: PT plan of care cert/re-cert  Other abnormalities of gait and mobility - Plan: PT plan of care  cert/re-cert     Problem List Patient Active Problem List   Diagnosis Date Noted  . Left flaccid hemiplegia 06/24/2016  . Ear itching   . Poor fluid intake   . Bradycardia, drug induced   . Poor nutrition   . Muscle spasticity   . Obesity (BMI 30.0-34.9) 04/08/2016  . Hyperlipidemia 04/08/2016  . Basal ganglia hemorrhage ([place]) 04/08/2016  . Aphasia as late effect of cerebrovascular accident   . Dysphagia as late effect of cerebrovascular disease   . Migraine with aura and without status migrainosus, not intractable   . HLD (hyperlipidemia)   . Benign essential HTN   . Gait disturbance, post-stroke   . Hemiplegia, post-stroke ([place])   . Hypokalemia   . Dysphagia, post-stroke   . Aphasia, post-stroke   . Bradycardia   . Headache, migraine   . Dysarthria, post-stroke   . Encephalopathy acute 04/02/2016  . ICH (intracerebral hemorrhage) (HCC) - R basal ganglia due to hypertensive emergency 04/01/2016  . Upper airway cough syndrome 12/06/2015  . PCP NOTES >>> 09/25/2015  . Need for hepatitis B vaccination 10/19/2014  . Insomnia 12/28/2012  . Annual physical exam 11/23/2011  . Hemorrhoids 10/13/2011  . Morbid obesity ([place]) 02/26/2011  . CHEST PAIN UNSPECIFIED 03/28/2010  . Essential hypertension 10/19/2007  . Headache(784.0) 05/12/2007    Miller,Jennifer L 12/01/2016, 2:59 PM  [facility] [address] [place], [place], [address] Phone: [phone]   Fax:  [phone]  Name: Abigail Campos MRN: [id number] Date of Birth: 15-Sep-1971  Geoffry Paradise, PT,DPT 12/01/16 2:59 PM Phone: [phone] Fax: [phone]

## 2016-12-01 NOTE — Therapy (Signed)
[facility] [address] [place], [place], [address] Phone: [phone]   Fax:  [phone]  Occupational Therapy Treatment  Patient Details  Name: Abigail Campos MRN: [id number] Date of Birth: 06/15/1971 Referring Provider: Dr. Posey Pronto  Encounter Date: 12/01/2016      OT End of Session - 12/01/16 1652    Visit Number 19   Number of Visits 35   Date for OT Re-Evaluation 01/31/16   Authorization Type Aetna (240 visit limit combined with home health, estimated HH used 24 combined), no auth required    Authorization Time Period week 1/8- renewed 12/01/16   OT Start Time 1318   OT Stop Time 1400   OT Time Calculation (min) 42 min      Past Medical History:  Diagnosis Date  . Essential hypertension 10/19/2007   03-2010: metoprolol changed to bystolic (was not feeling well on it, no specific allergy or reaction)    . Hypertension   . Stroke [facility])     Past Surgical History:  Procedure Laterality Date  . ABDOMINAL HYSTERECTOMY  11-14-07   no oophorectomy per surgical report  . CESAREAN SECTION     [id number]  . INGUINAL HERNIA REPAIR     2004    There were no vitals filed for this visit.      Subjective Assessment - 12/01/16 1655    Subjective  Pt denies pain   Pertinent History see Epic   Patient Stated Goals to be able to use left arm   Currently in Pain? No/denies      Treatment: discussed progress towards goals and continuing therapy. Pt reports she would like to continue. Supine rolling towards left side and way for spasticity management followed by gentle P/ROM to left shoulder then AA/ROM with max facilitation using PVC pipe frame for LUE.  Seated weight bearing through left hand with gentle elbow extension, min-mod facilitation. UE ranger for AA/ROM with max facilitation, pt demonstrates minimal activation. Therapist asked pt if she had considered botox, pt said that her MD didn't want to do  botox because the effects would wear off after 3 mons.                          OT Short Term Goals - 12/01/16 1646      OT SHORT TERM GOAL #1   Title Pt/ caregiver will be I with HEP.   Time 6   Period Weeks   Status Achieved     OT SHORT TERM GOAL #2   Title Pt/ family will verbalize understanding of LUE positioning to minimize pain and risk for injury, including splinting PRN.   Time 6   Period Weeks   Status Achieved  10/01/16, 10/26/16     OT SHORT TERM GOAL #3   Title Pt will donn shirt with min A.   Time 6   Period Weeks   Status Achieved  10/01/16  mod I     OT SHORT TERM GOAL #4   Title Pt will demonstrate 90* P/ROM shoulder flexion in LUE with pain less than or equal to 3/10.   Time 6   Period Weeks   Status Achieved  10/06/16 met to 90* with no pain     OT SHORT TERM GOAL #5   Title Pt will perform simple snack prep modified independently.   Time 6   Period Weeks   Status Partially Met    12/01/16 made  coffee     OT SHORT TERM GOAL #6   Title Pt will perform bathing at a supervision level   Time 6   Period Weeks   Status Achieved  10/01/16 met per pt report     OT SHORT TERM GOAL #7   Title Pt will perfom snack prep and light home management with min A .due 01/01/16   Baseline has only made coffee, folded towels   Time 4   Period Weeks   Status New     OT SHORT TERM GOAL #8   Title Pt will use LUE as a stabilizer/ gross assist for ADLS 16% of the time.   Time 4   Period Weeks   Status New     OT SHORT TERM GOAL  #9   TITLE I with updated HEP.   Time 4   Period Weeks   Status New           OT Long Term Goals - 12/01/16 1323      OT LONG TERM GOAL #1   Title Pt will perform LB dressing with min A.   Time 12   Period Weeks   Status Achieved  10/01/16:  needs A for L shoe/brace     OT LONG TERM GOAL #2   Title Pt will use LUE as a stabilizer/ gross A with min A.   Time 12   Period Weeks   Status Achieved   uses for bathing, ,uses grossly 25 % of the time     OT LONG TERM GOAL #3   Title Pt will perform simple home management tasks with min A.- now  a short term goal   Time 12   Period Weeks   Status Not Met  not consistently performing has perfomed folding laundry     OT LONG TERM GOAL #4   Title Pt will demonstrate adequate standing balance to retrieve a light weight object from overhead cabinet modified independently with RUE without LOB in prep for IADLS.   Time 12   Period Weeks   Status Achieved     OT LONG TERM GOAL #5   Title Pt will perform shower transfers modified independently   Time 12   Period Weeks   Status Achieved     Long Term Additional Goals   Additional Long Term Goals Yes     OT LONG TERM GOAL #6   Title Pt will use LUE as a gross assist 50 % of the time for ADLs/ functional activity.   Time 8   Period Weeks   Status New     OT LONG TERM GOAL #7   Title Pt will demonstrate 25% finger flexion/ extension in prep for grasp release of a 1 inch block.   Time 4   Period Weeks   Status New     OT LONG TERM GOAL #8   Title Pt will demonstrate 30* of left shoulder flexion in prep for functional reach.   Time 8   Period Weeks   Status New               Plan - 12/01/16 1634    Clinical Impression Statement Pt is progressing towards goals however she remains limited by LUE weaknes, cognitive deficits and inconsistent scheduling due to pt's schedule conflicts. Pt can benefit from continued skille OT to maximize safety and independence with ADLs/IADLS.   Rehab Potential Good   Clinical Impairments Affecting Rehab Potential severity of deficits; cognitive deficits  OT Frequency 2x / week   OT Duration 8 weeks   OT Treatment/Interventions Self-care/ADL training;Moist Heat;Fluidtherapy;DME and/or AE instruction;Splinting;Patient/family education;Balance Financial planner;Contrast Bath;Ultrasound;Therapeutic exercise;Therapeutic activities;Cognitive  remediation/compensation;Passive range of motion;Functional Mobility Training;Neuromuscular education;Cryotherapy;Electrical Stimulation;Parrafin;Energy conservation;Manual Therapy;Visual/perceptual remediation/compensation   Plan NMR - use of LUE   Consulted and Agree with Plan of Care Patient      Patient will benefit from skilled therapeutic intervention in order to improve the following deficits and impairments:  Abnormal gait, Decreased coordination, Decreased range of motion, Difficulty walking, Impaired flexibility, Impaired sensation, Increased edema, Decreased safety awareness, Decreased endurance, Decreased activity tolerance, Decreased knowledge of precautions, Increased fascial restricitons, Impaired tone, Pain, Impaired UE functional use, Decreased knowledge of use of DME, Decreased balance, Decreased cognition, Decreased mobility, Decreased strength, Impaired perceived functional ability, Impaired vision/preception  Visit Diagnosis: Hemiplegia and hemiparesis following cerebral infarction affecting left dominant side (HCC) - Plan: Ot plan of care cert/re-cert  Other lack of coordination - Plan: Ot plan of care cert/re-cert  Other abnormalities of gait and mobility - Plan: Ot plan of care cert/re-cert  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 Left flaccid hemiplegia 06/24/2016  . Ear itching   . Poor fluid intake   . Bradycardia, drug induced   . Poor nutrition   . Muscle spasticity   . Obesity (BMI 30.0-34.9) 04/08/2016  . Hyperlipidemia 04/08/2016  . Basal ganglia hemorrhage ([place]) 04/08/2016  . Aphasia as late effect of cerebrovascular accident   . Dysphagia as late effect of cerebrovascular disease   . Migraine with aura and without status migrainosus, not intractable   . HLD (hyperlipidemia)   . Benign essential HTN    . Gait disturbance, post-stroke   . Hemiplegia, post-stroke ([place])   . Hypokalemia   . Dysphagia, post-stroke   . Aphasia, post-stroke   . Bradycardia   . Headache, migraine   . Dysarthria, post-stroke   . Encephalopathy acute 04/02/2016  . ICH (intracerebral hemorrhage) (HCC) - R basal ganglia due to hypertensive emergency 04/01/2016  . Upper airway cough syndrome 12/06/2015  . PCP NOTES >>> 09/25/2015  . Need for hepatitis B vaccination 10/19/2014  . Insomnia 12/28/2012  . Annual physical exam 11/23/2011  . Hemorrhoids 10/13/2011  . Morbid obesity ([place]) 02/26/2011  . CHEST PAIN UNSPECIFIED 03/28/2010  . Essential hypertension 10/19/2007  . Headache(784.0) 05/12/2007    RINE,KATHRYN 12/01/2016, 5:01 PM  [facility] [address] [place], [place], [address] Phone: [phone]   Fax:  [phone]  Name: Abigail Campos MRN: [id number] Date of Birth: 08-04-1971

## 2016-12-04 ENCOUNTER — Ambulatory Visit: Payer: Managed Care, Other (non HMO)

## 2016-12-04 DIAGNOSIS — I69015 Cognitive social or emotional deficit following nontraumatic subarachnoid hemorrhage: Secondary | ICD-10-CM

## 2016-12-04 DIAGNOSIS — I69352 Hemiplegia and hemiparesis following cerebral infarction affecting left dominant side: Secondary | ICD-10-CM

## 2016-12-04 DIAGNOSIS — R2689 Other abnormalities of gait and mobility: Secondary | ICD-10-CM

## 2016-12-04 DIAGNOSIS — R471 Dysarthria and anarthria: Secondary | ICD-10-CM

## 2016-12-04 DIAGNOSIS — R278 Other lack of coordination: Secondary | ICD-10-CM

## 2016-12-04 NOTE — Therapy (Signed)
[facility] [address] [place], [place], [address] Phone: [phone]   Fax:  [phone]  Physical Therapy Treatment  Patient Details  Name: Abigail Campos MRN: [id number] Date of Birth: 21-Aug-1971 Referring Provider: Dr. Posey Pronto  Encounter Date: 12/04/2016      PT End of Session - 12/04/16 1451    Visit Number 22   Number of Visits 25   Date for PT Re-Evaluation 01/28/17   Authorization Type [employer]-240 visit limit (including HH and OP). Per Lattie Haw, pt used 24 total HHPT, [place], and [place] visits.   PT Start Time 1404   PT Stop Time 1445   PT Time Calculation (min) 41 min   Equipment Utilized During Treatment Gait belt   Activity Tolerance Patient tolerated treatment well   Behavior During Therapy WFL for tasks assessed/performed      Past Medical History:  Diagnosis Date  . Essential hypertension 10/19/2007   03-2010: metoprolol changed to bystolic (was not feeling well on it, no specific allergy or reaction)    . Hypertension   . Stroke [facility])     Past Surgical History:  Procedure Laterality Date  . ABDOMINAL HYSTERECTOMY  11-14-07   no oophorectomy per surgical report  . CESAREAN SECTION     [id number]  . INGUINAL HERNIA REPAIR     2004    There were no vitals filed for this visit.      Subjective Assessment - 12/04/16 1406    Subjective Pt denied falls since last visit. Pt reported her LLE/foot has been "feeling weird" and a little painful, pt denied doing anything different since last visit but feels like sensation might be getting better.   Pertinent History HTN, hyperlipidemia, hx of migraine   Patient Stated Goals I want to walk and stand up without falling.    Currently in Pain? No/denies                         [facility] Adult PT Treatment/Exercise - 12/04/16 1409      Ambulation/Gait   Ambulation/Gait Yes   Ambulation/Gait Assistance 5: Supervision;4: Min guard   Ambulation/Gait Assistance Details Cues to improve stride length, speed, and upright posture. Cues to decr. R UE synergy pattern. Pt also amb. over even terrain with no AD per pt request with cues to improve stride length, heel strike, and decr. RUE snyergy pattern. Pt reluctant to incr. Treadmill speed due to fear of falling.   Ambulation Distance (Feet) --  0.43mles on treadmill and 535 and 20'    Assistive device Other (Comment)  treadmill   Gait Pattern Step-to pattern;Step-through pattern;Decreased stance time - left;Decreased step length - right;Decreased hip/knee flexion - left;Left genu recurvatum;Left flexed knee in stance;Narrow base of support   Ambulation Surface Level;Indoor   Gait velocity 0.2MPH     Exercises   Exercises Knee/Hip;Ankle     Ankle Exercises: Aerobic   Stationary Bike Pt used SciFit bike for 8.5 minutes at level one with B LEs and R UE with strap to secure L foot on pedal and tactile and VC's to keep LLE from abducting. 0.260mes.                  PT Short Term Goals - 12/01/16 1450      PT SHORT TERM GOAL #1   Title Pt will be IND in performing progressed HEP to improve strength and balance. TARGET DATE FOR ALL STGS: 12/29/16  Status New     PT SHORT TERM GOAL #2   Title Pt will improve BERG score to >/=48/56 to decr. falls risk.    Status New     PT SHORT TERM GOAL #3   Title Pt will amb. 400' over even/uneven terrain with LRAD at MOD I level to improve functional mobility.    Status New     PT SHORT TERM GOAL #4   Title Pt will trial SciFit and treadmill and write goals if appropriate to reintegrate pt into community exercises.   Status New           PT Long Term Goals - 12/01/16 1452      PT LONG TERM GOAL #1   Title Pt will verablize plans to join fitness center to maintain gains made in PT. TARGET DATE FOR ALL LTGS: 01/26/17   Baseline ALL UNMET LTGS WILL BE CARRIED OVER TO NEW POC: 01/26/17   Status New     PT LONG TERM GOAL  #2   Title Pt will improve gait speed to >/=1.44f/sec. with LRAD to reduce falls risk.    Baseline not met 12/01/16: 0.84fsec.   Status On-going     PT LONG TERM GOAL #3   Title Pt will amb. 400' over even/paved surfaces with LRAD at MOD I level to improve functional mobility.    Baseline partially met 12/01/16   Status On-going     PT LONG TERM GOAL #4   Title Pt will perform TUG with LRAD in </=13.5sec. to improve function and decr. falls risk.    Baseline Not met on 12/01/16: 27.3   Status On-going     PT LONG TERM GOAL #6   Title Pt will improve BERG score to >/=51/56 to decr. falls risk.    Baseline 44/56 on 12/01/16   Status On-going               Plan - 12/04/16 1451    Clinical Impression Statement Pt demonstrated progress as she was able to amb. without AD and trial amb. on treadmill. Pt requires rest breaks 2/2 fatigue and frequent cues to correct gait deviation. Continue with POC.    Rehab Potential Good   Clinical Impairments Affecting Rehab Potential co-morbidities   PT Frequency 2x / week   PT Duration 8 weeks  original POC 2x/week for 8 weeks   PT Treatment/Interventions ADLs/Self Care Home Management;Biofeedback;Canalith Repostioning;Electrical Stimulation;Cognitive remediation;Neuromuscular re-education;Balance training;Therapeutic exercise;Therapeutic activities;Functional mobility training;Stair training;Gait training;DME Instruction;Orthotic Fit/Training;Patient/family education;Vestibular;Manual techniques   PT Next Visit Plan Continue gait training, strengthening LLE and balance training. Treadmill training.   PT Home Exercise Plan Strengthening HEP.    Consulted and Agree with Plan of Care Patient;Family member/caregiver   Family Member Consulted pt's mom: Abigail Campos    Patient will benefit from skilled therapeutic intervention in order to improve the following deficits and impairments:  Decreased endurance, Decreased knowledge of use of DME,  Decreased strength, Impaired UE functional use, Impaired sensation, Decreased balance, Decreased mobility, Decreased range of motion, Decreased coordination, Impaired flexibility, Decreased cognition, Abnormal gait  Visit Diagnosis: Hemiplegia and hemiparesis following cerebral infarction affecting left dominant side (HCC)  Other lack of coordination  Other abnormalities of gait and mobility     Problem List Patient Active Problem List   Diagnosis Date Noted  . Left flaccid hemiplegia 06/24/2016  . Ear itching   . Poor fluid intake   . Bradycardia, drug induced   . Poor nutrition   .  Muscle spasticity   . Obesity (BMI 30.0-34.9) 04/08/2016  . Hyperlipidemia 04/08/2016  . Basal ganglia hemorrhage ([place]) 04/08/2016  . Aphasia as late effect of cerebrovascular accident   . Dysphagia as late effect of cerebrovascular disease   . Migraine with aura and without status migrainosus, not intractable   . HLD (hyperlipidemia)   . Benign essential HTN   . Gait disturbance, post-stroke   . Hemiplegia, post-stroke ([place])   . Hypokalemia   . Dysphagia, post-stroke   . Aphasia, post-stroke   . Bradycardia   . Headache, migraine   . Dysarthria, post-stroke   . Encephalopathy acute 04/02/2016  . ICH (intracerebral hemorrhage) (HCC) - R basal ganglia due to hypertensive emergency 04/01/2016  . Upper airway cough syndrome 12/06/2015  . PCP NOTES >>> 09/25/2015  . Need for hepatitis B vaccination 10/19/2014  . Insomnia 12/28/2012  . Annual physical exam 11/23/2011  . Hemorrhoids 10/13/2011  . Morbid obesity ([place]) 02/26/2011  . CHEST PAIN UNSPECIFIED 03/28/2010  . Essential hypertension 10/19/2007  . Headache(784.0) 05/12/2007    Duran Ohern L 12/04/2016, 4:02 PM  [facility] [address] [place], [place], [address] Phone: [phone]   Fax:  [phone]  Name: Abigail Campos MRN: [id number] Date of Birth:  05/16/1971   Geoffry Paradise, PT,DPT 12/04/16 4:02 PM Phone: [phone] Fax: [phone]

## 2016-12-04 NOTE — Therapy (Addendum)
[facility] [address] [place], [place], [address] Phone: [phone]   Fax:  [phone]  Speech Language Pathology Treatment  Patient Details  Name: Abigail Campos MRN: [id number] Date of Birth: Oct 07, 1971 Referring Provider: Delice Lesch, M.D.  Encounter Date: 12/04/2016      End of Session - 12/04/16 1454    Visit Number 20   Number of Visits 32   Date for SLP Re-Evaluation 01/08/17   SLP Start Time [id number]   SLP Stop Time  [id number]   SLP Time Calculation (min) 40 min      Past Medical History:  Diagnosis Date  . Essential hypertension 10/19/2007   03-2010: metoprolol changed to bystolic (was not feeling well on it, no specific allergy or reaction)    . Hypertension   . Stroke [facility])     Past Surgical History:  Procedure Laterality Date  . ABDOMINAL HYSTERECTOMY  11-14-07   no oophorectomy per surgical report  . CESAREAN SECTION     [id number]  . INGUINAL HERNIA REPAIR     2004    There were no vitals filed for this visit.      Subjective Assessment - 12/04/16 1455    Subjective "I'm back, [name]."   Currently in Pain? No/denies               ADULT SLP TREATMENT - 12/04/16 1513      General Information   Behavior/Cognition Alert;Cooperative;Pleasant mood     Treatment Provided   Treatment provided Cognitive-Linquistic     Cognitive-Linquistic Treatment   Treatment focused on Cognition   Skilled Treatment SLP targeted pt's attention to detail, emergent awareness, and reasoning in tasks today. Pt demo'd decr'd emergent awareness 60% of the time without attempting to double-check answers, and decr'd attention to detail. Min A req'd occasionally for error awareness, and min A usually for attention to detail. Pt's reasoning skills req'd mod A rarely from SLP.     Assessment / Recommendations / Plan   Plan Continue with current plan of care     Progression Toward Goals   Progression toward  goals Progressing toward goals            SLP Short Term Goals - 12/04/16 1713      SLP SHORT TERM GOAL #1   Title pt will name 3 deficit areas independentlly, in two sessions   Baseline goal renewed/modified week of 10-26-16   Time 1   Period Weeks   Status On-going     SLP SHORT TERM GOAL #2   Title pt will demo selective attention to therapy tasks for 10 minutes in min-mod noisy environment   Status Achieved     SLP SHORT TERM GOAL #3   Title pt will complete tasks of simple cognitive linguistic organization with 90% success and rare min A   Status Achieved     SLP SHORT TERM GOAL #4   Title pt will attempt to self correct misarticulated speech in 50% of opportunities in structured speech tasks   Baseline goal renewed week of 10-26-16   Time 1   Period Weeks   Status On-going          SLP Long Term Goals - 12/04/16 1713      SLP LONG TERM GOAL #1   Title pt will demo alternating attention in simple cognitive linguistic tasks with appropriate switch times, with compensations   Status Achieved     SLP LONG TERM GOAL #2  Title pt will demo emergent awareness in cognitive linguistic tasks by self correcting errors in 80% of opportunities with nonverbal cues   Time 5   Period Weeks   Status On-going     SLP LONG TERM GOAL #3   Title pt will demo functional organization in mod complex linguistic tasks with modified independence   Time 5   Period Weeks   Status On-going     SLP LONG TERM GOAL #4   Title pt will demo rate of speech in 5 minutes appropriate for 95-99% intelligibility in simple to mod complex conversation with occasional verbal cues   Time 5   Period Weeks   Status On-going     SLP LONG TERM GOAL #5   Title pt will demo knowledge of fast speech rate decreasing intelligibility when in conversation and will self correct 90% of the time   Time 5   Period Weeks   Status On-going  due to cognitive ling goals     SLP LONG TERM GOAL #6   Title  pt will demo           Plan - 12/04/16 1712    Clinical Impression Statement Pt continues to require SLP A for organizing, reasoning, error awareness adn attention to detail. All cognitive tasks required extended time. Continue skilld ST to maximize cognition and intelligibility for improved indepedence.   Speech Therapy Frequency 2x / week   Duration --  8 weeks/32 total sessions   Treatment/Interventions Compensatory strategies;Patient/family education;Functional tasks;Cueing hierarchy;Cognitive reorganization;Internal/external aids;SLP instruction and feedback;Oral motor exercises   Potential to Achieve Goals Good   Potential Considerations Severity of impairments   Consulted and Agree with Plan of Care Patient      Patient will benefit from skilled therapeutic intervention in order to improve the following deficits and impairments:   Cognitive social or emotional deficit following nontraumatic subarachnoid hemorrhage  Dysarthria and anarthria    Problem List Patient Active Problem List   Diagnosis Date Noted  . Left flaccid hemiplegia 06/24/2016  . Ear itching   . Poor fluid intake   . Bradycardia, drug induced   . Poor nutrition   . Muscle spasticity   . Obesity (BMI 30.0-34.9) 04/08/2016  . Hyperlipidemia 04/08/2016  . Basal ganglia hemorrhage ([place]) 04/08/2016  . Aphasia as late effect of cerebrovascular accident   . Dysphagia as late effect of cerebrovascular disease   . Migraine with aura and without status migrainosus, not intractable   . HLD (hyperlipidemia)   . Benign essential HTN   . Gait disturbance, post-stroke   . Hemiplegia, post-stroke ([place])   . Hypokalemia   . Dysphagia, post-stroke   . Aphasia, post-stroke   . Bradycardia   . Headache, migraine   . Dysarthria, post-stroke   . Encephalopathy acute 04/02/2016  . ICH (intracerebral hemorrhage) (HCC) - R basal ganglia due to hypertensive emergency 04/01/2016  . Upper airway cough syndrome 12/06/2015   . PCP NOTES >>> 09/25/2015  . Need for hepatitis B vaccination 10/19/2014  . Insomnia 12/28/2012  . Annual physical exam 11/23/2011  . Hemorrhoids 10/13/2011  . Morbid obesity ([place]) 02/26/2011  . CHEST PAIN UNSPECIFIED 03/28/2010  . Essential hypertension 10/19/2007  . Headache(784.0) 05/12/2007    Tarrah Furuta ,[place], [place]  12/04/2016, 5:15 PM  [facility] [address] [place], [place], [address] Phone: [phone]   Fax:  [phone]   Name: SAMETRA CIESLIK MRN: [id number] Date of Birth: January 11, 1971

## 2016-12-08 ENCOUNTER — Ambulatory Visit: Payer: Managed Care, Other (non HMO)

## 2016-12-08 ENCOUNTER — Encounter: Payer: Managed Care, Other (non HMO) | Admitting: Occupational Therapy

## 2016-12-10 ENCOUNTER — Ambulatory Visit: Payer: Managed Care, Other (non HMO) | Admitting: Occupational Therapy

## 2016-12-10 ENCOUNTER — Ambulatory Visit: Payer: Managed Care, Other (non HMO)

## 2016-12-10 DIAGNOSIS — R278 Other lack of coordination: Secondary | ICD-10-CM

## 2016-12-10 DIAGNOSIS — I69352 Hemiplegia and hemiparesis following cerebral infarction affecting left dominant side: Secondary | ICD-10-CM

## 2016-12-10 DIAGNOSIS — I69115 Cognitive social or emotional deficit following nontraumatic intracerebral hemorrhage: Secondary | ICD-10-CM

## 2016-12-10 DIAGNOSIS — R2689 Other abnormalities of gait and mobility: Secondary | ICD-10-CM

## 2016-12-10 NOTE — Therapy (Signed)
[facility] [address] [place], [place], [address] Phone: [phone]   Fax:  [phone]  Physical Therapy Treatment  Patient Details  Name: Abigail Campos MRN: [id number] Date of Birth: Dec 02, 1971 Referring Provider: Dr. Posey Pronto  Encounter Date: 12/10/2016      PT End of Session - 12/10/16 1514    Visit Number 23   Number of Visits 41   Date for PT Re-Evaluation 01/28/17   Authorization Type [employer]-240 visit limit (including HH and OP). Per Lattie Haw, pt used 24 total HHPT, [place], and [place] visits.   PT Start Time 1316   PT Stop Time 1358   PT Time Calculation (min) 42 min   Equipment Utilized During Treatment Gait belt   Activity Tolerance Patient tolerated treatment well   Behavior During Therapy WFL for tasks assessed/performed      Past Medical History:  Diagnosis Date  . Essential hypertension 10/19/2007   03-2010: metoprolol changed to bystolic (was not feeling well on it, no specific allergy or reaction)    . Hypertension   . Stroke [facility])     Past Surgical History:  Procedure Laterality Date  . ABDOMINAL HYSTERECTOMY  11-14-07   no oophorectomy per surgical report  . CESAREAN SECTION     [id number]  . INGUINAL HERNIA REPAIR     2004    There were no vitals filed for this visit.      Subjective Assessment - 12/10/16 1319    Subjective Pt denied falls or changes since last visit.    Pertinent History HTN, hyperlipidemia, hx of migraine   Patient Stated Goals I want to walk and stand up without falling.    Currently in Pain? No/denies                         [facility] Adult PT Treatment/Exercise - 12/10/16 1320      Ambulation/Gait   Ambulation/Gait Yes   Ambulation/Gait Assistance 5: Supervision;4: Min guard   Ambulation/Gait Assistance Details S during amb. with SBQC and min guard to S while traversing stairs.   Ambulation Distance (Feet) 200 Feet  x2, 50'x2, 75'x2    Assistive device Small based quad cane   Gait Pattern Step-through pattern;Decreased stance time - left;Decreased step length - right;Decreased hip/knee flexion - left;Left genu recurvatum;Left flexed knee in stance;Narrow base of support   Ambulation Surface Level;Indoor   Stairs Yes   Stairs Assistance 5: Supervision;4: Min guard   Stairs Assistance Details (indicate cue type and reason) Cues and demo for sequencing and wt. shifting.   Stair Management Technique One rail Left;One rail Right;Step to pattern;Forwards   Number of Stairs 8   Height of Stairs 6     Exercises   Exercises Knee/Hip;Ankle     Knee/Hip Exercises: Standing   Functional Squat 5 sets;5 reps   Functional Squat Limitations Performed with 4" step under RLE to encourage incr. wt. bearing on LLE. Cues and intermittent min A to maintain wt. over LLE.                 PT Education - 12/10/16 1514    Education provided Yes   Education Details PT educated pt on [facility] purchase, as pt is amb. in safe manner with SBQC.    Person(s) Educated Patient   Methods Explanation   Comprehension Verbalized understanding          PT Short Term Goals - 12/01/16 1450  PT SHORT TERM GOAL #1   Title Pt will be IND in performing progressed HEP to improve strength and balance. TARGET DATE FOR ALL STGS: 12/29/16   Status New     PT SHORT TERM GOAL #2   Title Pt will improve BERG score to >/=48/56 to decr. falls risk.    Status New     PT SHORT TERM GOAL #3   Title Pt will amb. 400' over even/uneven terrain with LRAD at MOD I level to improve functional mobility.    Status New     PT SHORT TERM GOAL #4   Title Pt will trial SciFit and treadmill and write goals if appropriate to reintegrate pt into community exercises.   Status New           PT Long Term Goals - 12/01/16 1452      PT LONG TERM GOAL #1   Title Pt will verablize plans to join fitness center to maintain gains made in PT. TARGET DATE FOR ALL LTGS:  01/26/17   Baseline ALL UNMET LTGS WILL BE CARRIED OVER TO NEW POC: 01/26/17   Status New     PT LONG TERM GOAL #2   Title Pt will improve gait speed to >/=1.63f/sec. with LRAD to reduce falls risk.    Baseline not met 12/01/16: 0.832fsec.   Status On-going     PT LONG TERM GOAL #3   Title Pt will amb. 400' over even/paved surfaces with LRAD at MOD I level to improve functional mobility.    Baseline partially met 12/01/16   Status On-going     PT LONG TERM GOAL #4   Title Pt will perform TUG with LRAD in </=13.5sec. to improve function and decr. falls risk.    Baseline Not met on 12/01/16: 27.3   Status On-going     PT LONG TERM GOAL #6   Title Pt will improve BERG score to >/=51/56 to decr. falls risk.    Baseline 44/56 on 12/01/16   Status On-going               Plan - 12/10/16 1515    Clinical Impression Statement Pt demonstrated progress, as she was able to traverse stairs with less assist in correct sequencing pattern with minimal cues. Pt continues to require cues to improve wt. shifting onto LLE during gait and exercises but was able to self correct using mirror for feedback during squats. Continue with POC.    Rehab Potential Good   Clinical Impairments Affecting Rehab Potential co-morbidities   PT Frequency 2x / week   PT Duration 8 weeks  original POC 2x/week for 8 weeks   PT Treatment/Interventions ADLs/Self Care Home Management;Biofeedback;Canalith Repostioning;Electrical Stimulation;Cognitive remediation;Neuromuscular re-education;Balance training;Therapeutic exercise;Therapeutic activities;Functional mobility training;Stair training;Gait training;DME Instruction;Orthotic Fit/Training;Patient/family education;Vestibular;Manual techniques   PT Next Visit Plan Continue gait training, strengthening LLE and balance training. Treadmill training.   PT Home Exercise Plan Strengthening HEP.    Consulted and Agree with Plan of Care Patient;Family member/caregiver    Family Member Consulted pt's mom: Abigail Campos    Patient will benefit from skilled therapeutic intervention in order to improve the following deficits and impairments:  Decreased endurance, Decreased knowledge of use of DME, Decreased strength, Impaired UE functional use, Impaired sensation, Decreased balance, Decreased mobility, Decreased range of motion, Decreased coordination, Impaired flexibility, Decreased cognition, Abnormal gait  Visit Diagnosis: Hemiplegia and hemiparesis following cerebral infarction affecting left dominant side (HCC)  Other lack of coordination  Other abnormalities of gait  and mobility     Problem List Patient Active Problem List   Diagnosis Date Noted  . Left flaccid hemiplegia 06/24/2016  . Ear itching   . Poor fluid intake   . Bradycardia, drug induced   . Poor nutrition   . Muscle spasticity   . Obesity (BMI 30.0-34.9) 04/08/2016  . Hyperlipidemia 04/08/2016  . Basal ganglia hemorrhage ([place]) 04/08/2016  . Aphasia as late effect of cerebrovascular accident   . Dysphagia as late effect of cerebrovascular disease   . Migraine with aura and without status migrainosus, not intractable   . HLD (hyperlipidemia)   . Benign essential HTN   . Gait disturbance, post-stroke   . Hemiplegia, post-stroke ([place])   . Hypokalemia   . Dysphagia, post-stroke   . Aphasia, post-stroke   . Bradycardia   . Headache, migraine   . Dysarthria, post-stroke   . Encephalopathy acute 04/02/2016  . ICH (intracerebral hemorrhage) (HCC) - R basal ganglia due to hypertensive emergency 04/01/2016  . Upper airway cough syndrome 12/06/2015  . PCP NOTES >>> 09/25/2015  . Need for hepatitis B vaccination 10/19/2014  . Insomnia 12/28/2012  . Annual physical exam 11/23/2011  . Hemorrhoids 10/13/2011  . Morbid obesity ([place]) 02/26/2011  . CHEST PAIN UNSPECIFIED 03/28/2010  . Essential hypertension 10/19/2007  . Headache(784.0) 05/12/2007    Abigail Campos L 12/10/2016, 3:20  PM  [facility] [address] [place], [place], [address] Phone: [phone]   Fax:  [phone]  Name: Abigail Campos MRN: [id number] Date of Birth: [birth year]  Geoffry Paradise, PT,DPT 12/10/16 3:20 PM Phone: [phone] Fax: [phone]

## 2016-12-10 NOTE — Therapy (Signed)
[facility] [address] [place], [place], [address] Phone: [phone]   Fax:  [phone]  Occupational Therapy Treatment  Patient Details  Name: Abigail Campos MRN: [id number] Date of Birth: 15-Sep-1971 Referring Provider: Dr. Posey Pronto  Encounter Date: 12/10/2016      OT End of Session - 12/10/16 1704    Visit Number 20   Number of Visits 35   Date for OT Re-Evaluation 01/31/16   Authorization Type [employer] (240 visit limit combined with home health, estimated HH used 24 combined), no auth required    Authorization Time Period week 2/8- renewed 12/01/16   OT Start Time 1407   OT Stop Time 1446   OT Time Calculation (min) 39 min   Activity Tolerance Patient tolerated treatment well   Behavior During Therapy [facility] for tasks assessed/performed      Past Medical History:  Diagnosis Date  . Essential hypertension 10/19/2007   03-2010: metoprolol changed to bystolic (was not feeling well on it, no specific allergy or reaction)    . Hypertension   . Stroke [facility])     Past Surgical History:  Procedure Laterality Date  . ABDOMINAL HYSTERECTOMY  11-14-07   no oophorectomy per surgical report  . CESAREAN SECTION     [id number]  . INGUINAL HERNIA REPAIR     2004    There were no vitals filed for this visit.      Subjective Assessment - 12/10/16 1702    Subjective  pt reports that she has been trying to wt. bear in standing at home.   Pertinent History see Epic   Patient Stated Goals to be able to use left arm   Currently in Pain? No/denies           Seated weight bearing through left hand with gentle elbow extension and body on arm movements with min-mod facilitation.  UE ranger for AA/ROM for horizontal abduction and shoulder flex with min-mod facilitation, inconsistent activation, particularly with environmental distractions.  Wt. Bearing on L elbow in sitting with pushing to sit and focus on L shoulder  alignment/trunk control and posture for neuro re-ed of L side and incr wt. Bearing through L side.  Pt with inconsistent min-max A at times.  Wt. Bearing through [place] in standing with hands on table with lateral wt. Shifts and forward/backward wt. Shifts with inconsistent min-mod facilitation.  Standing with scapular retraction and cueing for midline alignment/wt. Shift to L side for improved shoulder positioning.  Facilitation/cueing provided to pt during functional mobility and transitional movements including sit>stand, stand>sit, turning, ambulation for incr wt. Shift to L side and midline alignment.  With both hands on one side of body in sitting, performed lateral trunk flex/ext for body on arm movements (performed on both sides with min facilitation and min-mod cueing.                          OT Short Term Goals - 12/01/16 1646      OT SHORT TERM GOAL #1   Title Pt/ caregiver will be I with HEP.   Time 6   Period Weeks   Status Achieved     OT SHORT TERM GOAL #2   Title Pt/ family will verbalize understanding of LUE positioning to minimize pain and risk for injury, including splinting PRN.   Time 6   Period Weeks   Status Achieved  10/01/16, 10/26/16     OT SHORT TERM  GOAL #3   Title Pt will donn shirt with min A.   Time 6   Period Weeks   Status Achieved  10/01/16  mod I     OT SHORT TERM GOAL #4   Title Pt will demonstrate 90* P/ROM shoulder flexion in LUE with pain less than or equal to 3/10.   Time 6   Period Weeks   Status Achieved  10/06/16 met to 90* with no pain     OT SHORT TERM GOAL #5   Title Pt will perform simple snack prep modified independently.   Time 6   Period Weeks   Status Partially Met    12/01/16 made coffee     OT SHORT TERM GOAL #6   Title Pt will perform bathing at a supervision level   Time 6   Period Weeks   Status Achieved  10/01/16 met per pt report     OT SHORT TERM GOAL #7   Title Pt will perfom snack  prep and light home management with min A .due 01/01/16   Baseline has only made coffee, folded towels   Time 4   Period Weeks   Status New     OT SHORT TERM GOAL #8   Title Pt will use LUE as a stabilizer/ gross assist for ADLS 16% of the time.   Time 4   Period Weeks   Status New     OT SHORT TERM GOAL  #9   TITLE I with updated HEP.   Time 4   Period Weeks   Status New           OT Long Term Goals - 12/01/16 1323      OT LONG TERM GOAL #1   Title Pt will perform LB dressing with min A.   Time 12   Period Weeks   Status Achieved  10/01/16:  needs A for L shoe/brace     OT LONG TERM GOAL #2   Title Pt will use LUE as a stabilizer/ gross A with min A.   Time 12   Period Weeks   Status Achieved  uses for bathing, ,uses grossly 25 % of the time     OT LONG TERM GOAL #3   Title Pt will perform simple home management tasks with min A.- now  a short term goal   Time 12   Period Weeks   Status Not Met  not consistently performing has perfomed folding laundry     OT LONG TERM GOAL #4   Title Pt will demonstrate adequate standing balance to retrieve a light weight object from overhead cabinet modified independently with RUE without LOB in prep for IADLS.   Time 12   Period Weeks   Status Achieved     OT LONG TERM GOAL #5   Title Pt will perform shower transfers modified independently   Time 12   Period Weeks   Status Achieved     Long Term Additional Goals   Additional Long Term Goals Yes     OT LONG TERM GOAL #6   Title Pt will use LUE as a gross assist 50 % of the time for ADLs/ functional activity.   Time 8   Period Weeks   Status New     OT LONG TERM GOAL #7   Title Pt will demonstrate 25% finger flexion/ extension in prep for grasp release of a 1 inch block.   Time 4   Period Weeks  Status New     OT LONG TERM GOAL #8   Title Pt will demonstrate 30* of left shoulder flexion in prep for functional reach.   Time 8   Period Weeks   Status New                Plan - 12/10/16 1705    Clinical Impression Statement Pt is slowly progressing with improved ability to wt. bear through L side.  Cognitive deficits with decr awareness/sensation of L side and decr attention affect progress.   Rehab Potential Good   Clinical Impairments Affecting Rehab Potential severity of deficits; cognitive deficits   OT Frequency 2x / week   OT Duration 8 weeks   OT Treatment/Interventions Self-care/ADL training;Moist Heat;Fluidtherapy;DME and/or AE instruction;Splinting;Patient/family education;Balance Financial planner;Contrast Bath;Ultrasound;Therapeutic exercise;Therapeutic activities;Cognitive remediation/compensation;Passive range of motion;Functional Mobility Training;Neuromuscular education;Cryotherapy;Electrical Stimulation;Parrafin;Energy conservation;Manual Therapy;Visual/perceptual remediation/compensation   Plan neuro re-ed   OT Home Exercise Plan 09/07/16:  resting splint wear/care; HEP   Consulted and Agree with Plan of Care Patient      Patient will benefit from skilled therapeutic intervention in order to improve the following deficits and impairments:  Abnormal gait, Decreased coordination, Decreased range of motion, Difficulty walking, Impaired flexibility, Impaired sensation, Increased edema, Decreased safety awareness, Decreased endurance, Decreased activity tolerance, Decreased knowledge of precautions, Increased fascial restricitons, Impaired tone, Pain, Impaired UE functional use, Decreased knowledge of use of DME, Decreased balance, Decreased cognition, Decreased mobility, Decreased strength, Impaired perceived functional ability, Impaired vision/preception  Visit Diagnosis: Hemiplegia and hemiparesis following cerebral infarction affecting left dominant side (HCC)  Other lack of coordination  Other abnormalities of gait and mobility  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 Left flaccid hemiplegia 06/24/2016  . Ear itching   . Poor fluid intake   . Bradycardia, drug induced   . Poor nutrition   . Muscle spasticity   . Obesity (BMI 30.0-34.9) 04/08/2016  . Hyperlipidemia 04/08/2016  . Basal ganglia hemorrhage ([place]) 04/08/2016  . Aphasia as late effect of cerebrovascular accident   . Dysphagia as late effect of cerebrovascular disease   . Migraine with aura and without status migrainosus, not intractable   . HLD (hyperlipidemia)   . Benign essential HTN   . Gait disturbance, post-stroke   . Hemiplegia, post-stroke ([place])   . Hypokalemia   . Dysphagia, post-stroke   . Aphasia, post-stroke   . Bradycardia   . Headache, migraine   . Dysarthria, post-stroke   . Encephalopathy acute 04/02/2016  . ICH (intracerebral hemorrhage) (HCC) - R basal ganglia due to hypertensive emergency 04/01/2016  . Upper airway cough syndrome 12/06/2015  . PCP NOTES >>> 09/25/2015  . Need for hepatitis B vaccination 10/19/2014  . Insomnia 12/28/2012  . Annual physical exam 11/23/2011  . Hemorrhoids 10/13/2011  . Morbid obesity ([place]) 02/26/2011  . CHEST PAIN UNSPECIFIED 03/28/2010  . Essential hypertension 10/19/2007  . Headache(784.0) 05/12/2007    [facility] 12/10/2016, 5:06 PM  [facility] [address] [place], [place], [address] Phone: [phone]   Fax:  [phone]  Name: Abigail Campos MRN: [id number] Date of Birth: 03-07-1971   Vianne Bulls, OTR/L [facility] [address] [place], [place]  [address] [phone] phone [phone] 12/10/16 5:06 PM

## 2016-12-15 ENCOUNTER — Ambulatory Visit: Payer: Managed Care, Other (non HMO) | Attending: Physical Medicine & Rehabilitation | Admitting: Occupational Therapy

## 2016-12-15 ENCOUNTER — Ambulatory Visit: Payer: Managed Care, Other (non HMO)

## 2016-12-15 VITALS — BP 111/73 | HR 65

## 2016-12-15 DIAGNOSIS — R2689 Other abnormalities of gait and mobility: Secondary | ICD-10-CM | POA: Diagnosis present

## 2016-12-15 DIAGNOSIS — R471 Dysarthria and anarthria: Secondary | ICD-10-CM | POA: Diagnosis present

## 2016-12-15 DIAGNOSIS — R2681 Unsteadiness on feet: Secondary | ICD-10-CM | POA: Diagnosis present

## 2016-12-15 DIAGNOSIS — R278 Other lack of coordination: Secondary | ICD-10-CM | POA: Insufficient documentation

## 2016-12-15 DIAGNOSIS — I69115 Cognitive social or emotional deficit following nontraumatic intracerebral hemorrhage: Secondary | ICD-10-CM | POA: Diagnosis present

## 2016-12-15 DIAGNOSIS — M25512 Pain in left shoulder: Secondary | ICD-10-CM | POA: Diagnosis present

## 2016-12-15 DIAGNOSIS — I69352 Hemiplegia and hemiparesis following cerebral infarction affecting left dominant side: Secondary | ICD-10-CM | POA: Insufficient documentation

## 2016-12-15 DIAGNOSIS — R41841 Cognitive communication deficit: Secondary | ICD-10-CM | POA: Insufficient documentation

## 2016-12-15 NOTE — Therapy (Signed)
[facility] [address] [place], [place], [address] Phone: [phone]   Fax:  [phone]  Physical Therapy Treatment  Patient Details  Name: Abigail Campos MRN: [id number] Date of Birth: 1970/12/16 Referring Provider: Dr. Posey Pronto  Encounter Date: 12/15/2016      PT End of Session - 12/15/16 1401    Visit Number 24   Number of Visits 41   Date for PT Re-Evaluation 01/28/17   Authorization Type Aet[employer]-240 visit limit (including HH and OP). Per Lattie Haw, pt used 24 total HHPT, [place], and [place] visits.   PT Start Time 1100   PT Stop Time 1141   PT Time Calculation (min) 41 min   Equipment Utilized During Treatment Gait belt   Activity Tolerance Other (comment)  session ended a few minutes early 2/2 dizziness and OT informed   Behavior During Therapy WFL for tasks assessed/performed      Past Medical History:  Diagnosis Date  . Essential hypertension 10/19/2007   03-2010: metoprolol changed to bystolic (was not feeling well on it, no specific allergy or reaction)    . Hypertension   . Stroke [facility])     Past Surgical History:  Procedure Laterality Date  . ABDOMINAL HYSTERECTOMY  11-14-07   no oophorectomy per surgical report  . CESAREAN SECTION     [id number]  . INGUINAL HERNIA REPAIR     2004    Vitals:   12/15/16 1106 12/15/16 1107  BP: 121/78 111/73  Pulse: 62 65        Subjective Assessment - 12/15/16 1103    Subjective "I just feel off today." Pt denied falls or changes since last visit.    Patient is accompained by: Family member  husband: Abigail Campos   Pertinent History HTN, hyperlipidemia, hx of migraine   Patient Stated Goals I want to walk and stand up without falling.    Currently in Pain? No/denies                         [facility] Adult PT Treatment/Exercise - 12/15/16 1110      Ambulation/Gait   Ambulation/Gait Yes   Ambulation/Gait Assistance 5: Supervision;4: Min guard   Ambulation/Gait Assistance Details Min guard to S on treadmill 2/2 LOB.  Cues to improve upright posture, heel strike, and LLE stance time. PT re-educated pt on stair strategy when stepping up/down on treadmill. Pt required two standing rest breaks during treadmill gait training and one seated rest break after amb. 2/2 fatigue.    Ambulation Distance (Feet) --  0.66mles on treadmill and 100'x2    Assistive device Small based quad cane;Other (Comment)  treadmill   Gait Pattern Step-through pattern;Decreased stance time - left;Decreased step length - right;Decreased hip/knee flexion - left;Left genu recurvatum;Left flexed knee in stance;Narrow base of support   Ambulation Surface Level;Indoor;Other (comment)  treadmill     Exercises   Exercises Knee/Hip;Ankle     Knee/Hip Exercises: Standing   Forward Step Up 1 set;Right;Hand Hold: 0;Step Height: 4"   Forward Step Up Limitations Pt performed 3 reps to improve strength, but required seated rest break 2/2 dizziness during third rep.   Step Down 1 set;Step Height: 4";Hand Hold: 0;Left   Step Down Limitations Pt performed x3 reps with LLE prior to requiring seated rest break 2/2 dizziness.    Other Standing Knee Exercises In tall kneeling on mat with R UE support on physioball: pt performed squats 3x10 reps with tactile and  VC's to improve wt. shifting onto LLE. Pt required assist to obtain tall kneeling and to resume seated position after therex.                 PT Education - 12/15/16 1400    Education provided Yes   Education Details PT discussed BP/HR findings (WNL).    Person(s) Educated Patient   Methods Explanation   Comprehension Verbalized understanding          PT Short Term Goals - 12/01/16 1450      PT SHORT TERM GOAL #1   Title Pt will be IND in performing progressed HEP to improve strength and balance. TARGET DATE FOR ALL STGS: 12/29/16   Status New     PT SHORT TERM GOAL #2   Title Pt will improve BERG score to  >/=48/56 to decr. falls risk.    Status New     PT SHORT TERM GOAL #3   Title Pt will amb. 400' over even/uneven terrain with LRAD at MOD I level to improve functional mobility.    Status New     PT SHORT TERM GOAL #4   Title Pt will trial Sci[employer] and treadmill and write goals if appropriate to reintegrate pt into community exercises.   Status New           PT Long Term Goals - 12/01/16 1452      PT LONG TERM GOAL #1   Title Pt will verablize plans to join fitness center to maintain gains made in PT. TARGET DATE FOR ALL LTGS: 01/26/17   Baseline ALL UNMET LTGS WILL BE CARRIED OVER TO NEW POC: 01/26/17   Status New     PT LONG TERM GOAL #2   Title Pt will improve gait speed to >/=1.58f/sec. with LRAD to reduce falls risk.    Baseline not met 12/01/16: 0.879fsec.   Status On-going     PT LONG TERM GOAL #3   Title Pt will amb. 400' over even/paved surfaces with LRAD at MOD I level to improve functional mobility.    Baseline partially met 12/01/16   Status On-going     PT LONG TERM GOAL #4   Title Pt will perform TUG with LRAD in </=13.5sec. to improve function and decr. falls risk.    Baseline Not met on 12/01/16: 27.3   Status On-going     PT LONG TERM GOAL #6   Title Pt will improve BERG score to >/=51/56 to decr. falls risk.    Baseline 44/56 on 12/01/16   Status On-going               Plan - 12/15/16 1401    Clinical Impression Statement PT assessed BP and HR in seated and standing positions at beginning of session 2/2 pt's c/o "not feeling right". Vitals were WNL and negative for orthostasis. Pt was able to improve weight shifting onto LLE during tall kneeling squats after tactile and verbal cues. Pt required rest breaks today 2/2 fatigue and dizziness. Pt was able to demonstrated improve LLE stance time during gait with cues. Continue with POC.    Rehab Potential Good   Clinical Impairments Affecting Rehab Potential co-morbidities   PT Frequency 2x / week    PT Duration 8 weeks  original POC 2x/week for 8 weeks   PT Treatment/Interventions ADLs/Self Care Home Management;Biofeedback;Canalith Repostioning;Electrical Stimulation;Cognitive remediation;Neuromuscular re-education;Balance training;Therapeutic exercise;Therapeutic activities;Functional mobility training;Stair training;Gait training;DME Instruction;Orthotic Fit/Training;Patient/family education;Vestibular;Manual techniques   PT Next Visit Plan Continue gait training  with SBQC, strengthening LLE and balance training. Treadmill training.   PT Home Exercise Plan Strengthening HEP.    Consulted and Agree with Plan of Care Patient;Family member/caregiver   Family Member Consulted pt's mom: [name]      Patient will benefit from skilled therapeutic intervention in order to improve the following deficits and impairments:  Decreased endurance, Decreased knowledge of use of DME, Decreased strength, Impaired UE functional use, Impaired sensation, Decreased balance, Decreased mobility, Decreased range of motion, Decreased coordination, Impaired flexibility, Decreased cognition, Abnormal gait  Visit Diagnosis: Hemiplegia and hemiparesis following cerebral infarction affecting left dominant side (HCC)  Other abnormalities of gait and mobility     Problem List Patient Active Problem List   Diagnosis Date Noted  . Left flaccid hemiplegia 06/24/2016  . Ear itching   . Poor fluid intake   . Bradycardia, drug induced   . Poor nutrition   . Muscle spasticity   . Obesity (BMI 30.0-34.9) 04/08/2016  . Hyperlipidemia 04/08/2016  . Basal ganglia hemorrhage ([place]) 04/08/2016  . Aphasia as late effect of cerebrovascular accident   . Dysphagia as late effect of cerebrovascular disease   . Migraine with aura and without status migrainosus, not intractable   . HLD (hyperlipidemia)   . Benign essential HTN   . Gait disturbance, post-stroke   . Hemiplegia, post-stroke ([place])   . Hypokalemia   . Dysphagia,  post-stroke   . Aphasia, post-stroke   . Bradycardia   . Headache, migraine   . Dysarthria, post-stroke   . Encephalopathy acute 04/02/2016  . ICH (intracerebral hemorrhage) (HCC) - R basal ganglia due to hypertensive emergency 04/01/2016  . Upper airway cough syndrome 12/06/2015  . PCP NOTES >>> 09/25/2015  . Need for hepatitis B vaccination 10/19/2014  . Insomnia 12/28/2012  . Annual physical exam 11/23/2011  . Hemorrhoids 10/13/2011  . Morbid obesity ([place]) 02/26/2011  . CHEST PAIN UNSPECIFIED 03/28/2010  . Essential hypertension 10/19/2007  . Headache(784.0) 05/12/2007    Ambri Miltner L 12/15/2016, 2:05 PM  [facility] [address] [place], [place], [address] Phone: [phone]   Fax:  [phone]  Name: ALAZAE CRYMES MRN: [id number] Date of Birth: 1971/04/06  Geoffry Paradise, PT,DPT 12/15/16 2:06 PM Phone: [phone] Fax: [phone]

## 2016-12-15 NOTE — Therapy (Addendum)
[place] [address], [place], [address] Phone: [phone]   Fax:  [phone]  Occupational Therapy Treatment  Patient Details  Name: Abigail Campos MRN: [id number] Date of Birth: Apr 29, 1971 Referring Provider: Dr. Posey Pronto  Encounter Date: 12/15/2016      OT End of Session - 12/15/16 1223    Visit Number 21   Number of Visits 35   Date for OT Re-Evaluation 01/31/16   Authorization Type [employer] (240 visit limit combined with home health, estimated HH used 24 combined), no auth required    Authorization Time Period week 3/8- renewed 12/01/16   OT Start Time 1152   OT Stop Time 1231   OT Time Calculation (min) 39 min   Activity Tolerance Patient tolerated treatment well   Behavior During Therapy [facility] for tasks assessed/performed      Past Medical History:  Diagnosis Date  . Essential hypertension 10/19/2007   03-2010: metoprolol changed to bystolic (was not feeling well on it, no specific allergy or reaction)    . Hypertension   . Stroke [facility])     Past Surgical History:  Procedure Laterality Date  . ABDOMINAL HYSTERECTOMY  11-14-07   no oophorectomy per surgical report  . CESAREAN SECTION     [id number]  . INGUINAL HERNIA REPAIR     2004    There were no vitals filed for this visit.      Subjective Assessment - 12/15/16 1222    Subjective  Pt reports that she feels "off" today  (also see PT note)   Pertinent History see Epic   Patient Stated Goals to be able to use left arm   Currently in Pain? No/denies      In supine,  AAROM elbow flex/ext, shoulder abduction/adduction, shoulder flex.  Pt performed with min-mod facilitation (inconsistent with decr attention as pt continues to be easily distracted).  In supine, roll to the L for wt. Bearing thought L side/arm with min v.c.  In supine, AAROM chest press with PVC frame with min-mod facilitation (inconsistent due to attention  deficits)   NMES x10 min to wrist/finger extensors for decr spasticity/neuro re-ed.  10sec cycle, 2sec ramp, 50pps, 250 pulse width, intensity=19 with no adverse reactions while pt assisting with IP extension prn.                     OT Education - 12/15/16 1625    Education Details How decr attention affects performance with LUE   Person(s) Educated Patient   Methods Explanation   Comprehension Verbalized understanding          OT Short Term Goals - 12/15/16 1225      OT SHORT TERM GOAL #1   Title Pt/ caregiver will be I with HEP.   Time 6   Period Weeks   Status Achieved     OT SHORT TERM GOAL #2   Title Pt/ family will verbalize understanding of LUE positioning to minimize pain and risk for injury, including splinting PRN.   Time 6   Period Weeks   Status Achieved  10/01/16, 10/26/16     OT SHORT TERM GOAL #3   Title Pt will donn shirt with min A.   Time 6   Period Weeks   Status Achieved  10/01/16  mod I     OT SHORT TERM GOAL #4   Title Pt will demonstrate 90* P/ROM shoulder flexion in LUE with pain less than or equal  to 3/10.   Time 6   Period Weeks   Status Achieved  10/06/16 met to 90* with no pain     OT SHORT TERM GOAL #5   Title Pt will perform simple snack prep modified independently.   Time 6   Period Weeks   Status Achieved    12/01/16 made coffee.  12/16/15:  met per pt report     OT SHORT TERM GOAL #6   Title Pt will perform bathing at a supervision level   Time 6   Period Weeks   Status Achieved  10/01/16 met per pt report     OT SHORT TERM GOAL #7   Title Pt will perfom snack prep and light home management with min A .due 01/01/16   Baseline has only made coffee, folded towels   Time 4   Period Weeks   Status On-going     OT SHORT TERM GOAL #8   Title Pt will use LUE as a stabilizer/ gross assist for ADLS 29% of the time.   Time 4   Period Weeks   Status New     OT SHORT TERM GOAL  #9   TITLE I with updated HEP.    Time 4   Period Weeks   Status New           OT Long Term Goals - 12/01/16 1323      OT LONG TERM GOAL #1   Title Pt will perform LB dressing with min A.   Time 12   Period Weeks   Status Achieved  10/01/16:  needs A for L shoe/brace     OT LONG TERM GOAL #2   Title Pt will use LUE as a stabilizer/ gross A with min A.   Time 12   Period Weeks   Status Achieved  uses for bathing, ,uses grossly 25 % of the time     OT LONG TERM GOAL #3   Title Pt will perform simple home management tasks with min A.- now  a short term goal   Time 12   Period Weeks   Status Not Met  not consistently performing has perfomed folding laundry     OT LONG TERM GOAL #4   Title Pt will demonstrate adequate standing balance to retrieve a light weight object from overhead cabinet modified independently with RUE without LOB in prep for IADLS.   Time 12   Period Weeks   Status Achieved     OT LONG TERM GOAL #5   Title Pt will perform shower transfers modified independently   Time 12   Period Weeks   Status Achieved     Long Term Additional Goals   Additional Long Term Goals Yes     OT LONG TERM GOAL #6   Title Pt will use LUE as a gross assist 50 % of the time for ADLs/ functional activity.   Time 8   Period Weeks   Status New     OT LONG TERM GOAL #7   Title Pt will demonstrate 25% finger flexion/ extension in prep for grasp release of a 1 inch block.   Time 4   Period Weeks   Status New     OT LONG TERM GOAL #8   Title Pt will demonstrate 30* of left shoulder flexion in prep for functional reach.   Time 8   Period Weeks   Status New  Plan - 12/15/16 1223    Clinical Impression Statement Pt demo ability to activate shoulder flex (AAROM in supine today).  Slowly progressing towards goals although, decr attention/distractions affect performance.   Rehab Potential Good   Clinical Impairments Affecting Rehab Potential severity of deficits; cognitive deficits    OT Frequency 2x / week   OT Duration 8 weeks   OT Treatment/Interventions Self-care/ADL training;Moist Heat;Fluidtherapy;DME and/or AE instruction;Splinting;Patient/family education;Balance Financial planner;Contrast Bath;Ultrasound;Therapeutic exercise;Therapeutic activities;Cognitive remediation/compensation;Passive range of motion;Functional Mobility Training;Neuromuscular education;Cryotherapy;Electrical Stimulation;Parrafin;Energy conservation;Manual Therapy;Visual/perceptual remediation/compensation   Plan continue with neuro re-ed, simple IADL   OT Home Exercise Plan 09/07/16:  resting splint wear/care; HEP   Consulted and Agree with Plan of Care Patient      Patient will benefit from skilled therapeutic intervention in order to improve the following deficits and impairments:  Abnormal gait, Decreased coordination, Decreased range of motion, Difficulty walking, Impaired flexibility, Impaired sensation, Increased edema, Decreased safety awareness, Decreased endurance, Decreased activity tolerance, Decreased knowledge of precautions, Increased fascial restricitons, Impaired tone, Pain, Impaired UE functional use, Decreased knowledge of use of DME, Decreased balance, Decreased cognition, Decreased mobility, Decreased strength, Impaired perceived functional ability, Impaired vision/preception  Visit Diagnosis: Hemiplegia and hemiparesis following cerebral infarction affecting left dominant side (HCC)  Other lack of coordination  Other abnormalities of gait and mobility  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 Left flaccid hemiplegia 06/24/2016  . Ear itching   . Poor fluid intake   . Bradycardia, drug induced   . Poor nutrition   . Muscle spasticity   . Obesity (BMI 30.0-34.9) 04/08/2016  . Hyperlipidemia 04/08/2016  . Basal ganglia hemorrhage ([place]) 04/08/2016   . Aphasia as late effect of cerebrovascular accident   . Dysphagia as late effect of cerebrovascular disease   . Migraine with aura and without status migrainosus, not intractable   . HLD (hyperlipidemia)   . Benign essential HTN   . Gait disturbance, post-stroke   . Hemiplegia, post-stroke ([place])   . Hypokalemia   . Dysphagia, post-stroke   . Aphasia, post-stroke   . Bradycardia   . Headache, migraine   . Dysarthria, post-stroke   . Encephalopathy acute 04/02/2016  . ICH (intracerebral hemorrhage) (HCC) - R basal ganglia due to hypertensive emergency 04/01/2016  . Upper airway cough syndrome 12/06/2015  . PCP NOTES >>> 09/25/2015  . Need for hepatitis B vaccination 10/19/2014  . Insomnia 12/28/2012  . Annual physical exam 11/23/2011  . Hemorrhoids 10/13/2011  . Morbid obesity ([place]) 02/26/2011  . CHEST PAIN UNSPECIFIED 03/28/2010  . Essential hypertension 10/19/2007  . Headache(784.0) 05/12/2007    [facility] 12/15/2016, 4:25 PM  [place] [address] [place], [place], [address] Phone: [phone]   Fax:  [phone]  Name: Abigail Campos MRN: [id number] Date of Birth: October 12, 1971   Vianne Bulls, OTR/L [facility] [address] [place], [place]  [address] [phone] phone [phone] 12/15/16 4:28 PM

## 2016-12-21 ENCOUNTER — Ambulatory Visit: Payer: Managed Care, Other (non HMO) | Admitting: Occupational Therapy

## 2016-12-21 ENCOUNTER — Ambulatory Visit: Payer: Managed Care, Other (non HMO) | Admitting: Physical Therapy

## 2016-12-21 ENCOUNTER — Encounter: Payer: Self-pay | Admitting: Physical Therapy

## 2016-12-21 DIAGNOSIS — R2681 Unsteadiness on feet: Secondary | ICD-10-CM

## 2016-12-21 DIAGNOSIS — R278 Other lack of coordination: Secondary | ICD-10-CM

## 2016-12-21 DIAGNOSIS — I69352 Hemiplegia and hemiparesis following cerebral infarction affecting left dominant side: Secondary | ICD-10-CM

## 2016-12-21 DIAGNOSIS — R2689 Other abnormalities of gait and mobility: Secondary | ICD-10-CM

## 2016-12-21 DIAGNOSIS — I69115 Cognitive social or emotional deficit following nontraumatic intracerebral hemorrhage: Secondary | ICD-10-CM

## 2016-12-21 NOTE — Therapy (Signed)
[place] [address] [place], [place], [address] Phone: [phone]   Fax:  [phone]  Occupational Therapy Treatment  Patient Details  Name: Abigail Campos MRN: [id number] Date of Birth: 11-11-1971 Referring Provider: Dr. Posey Pronto  Encounter Date: 12/21/2016      OT End of Session - 12/21/16 1409    Visit Number 22   Number of Visits 35   Date for OT Re-Evaluation 01/31/16   Authorization Type [employer] (240 visit limit combined with home health, estimated HH used 24 combined), no auth required    Authorization Time Period week 4/8- renewed 12/01/16   OT Start Time 1321   OT Stop Time 1401   OT Time Calculation (min) 40 min   Activity Tolerance Patient tolerated treatment well   Behavior During Therapy [facility] for tasks assessed/performed      Past Medical History:  Diagnosis Date  . Essential hypertension 10/19/2007   03-2010: metoprolol changed to bystolic (was not feeling well on it, no specific allergy or reaction)    . Hypertension   . Stroke [facility])     Past Surgical History:  Procedure Laterality Date  . ABDOMINAL HYSTERECTOMY  11-14-07   no oophorectomy per surgical report  . CESAREAN SECTION     [id number]  . INGUINAL HERNIA REPAIR     2004    There were no vitals filed for this visit.      Subjective Assessment - 12/21/16 1324    Subjective  Pt reports that she cannot feel LUE as much today with AAROM (but able to more with wt. bearing)   Pertinent History see Epic   Patient Stated Goals to be able to use left arm   Currently in Pain? No/denies       In supine,  AAROM elbow flex/ext, shoulder abduction/adduction, shoulder flex.  Pt performed with min-mod facilitation (inconsistent with decr attention as pt continues to be easily distracted).  In supine, AAROM chest press and shoulder flex with PVC frame with min-mod facilitation (inconsistent due to attention deficits).    In supine, roll  to the L for wt. Bearing thought L side/arm with min v.c.  In sitting, wt. Bearing through L side with body on arm movements/lateral wt. Shift with min cueing/facilitation for alignment.  In standing, focus on midline alignment and focus on wt. Bearing through [place] on raised mat with mod facilitation/cues.                         OT Short Term Goals - 12/21/16 1410      OT SHORT TERM GOAL #1   Title Pt/ caregiver will be I with HEP.   Time 6   Period Weeks   Status Achieved     OT SHORT TERM GOAL #2   Title Pt/ family will verbalize understanding of LUE positioning to minimize pain and risk for injury, including splinting PRN.   Time 6   Period Weeks   Status Achieved  10/01/16, 10/26/16     OT SHORT TERM GOAL #3   Title Pt will donn shirt with min A.   Time 6   Period Weeks   Status Achieved  10/01/16  mod I     OT SHORT TERM GOAL #4   Title Pt will demonstrate 90* P/ROM shoulder flexion in LUE with pain less than or equal to 3/10.   Time 6   Period Weeks   Status Achieved  10/06/16  met to 90* with no pain     OT SHORT TERM GOAL #5   Title Pt will perform simple snack prep modified independently.   Time 6   Period Weeks   Status Achieved    12/01/16 made coffee.  12/16/15:  met per pt report     OT SHORT TERM GOAL #6   Title Pt will perform bathing at a supervision level   Time 6   Period Weeks   Status Achieved  10/01/16 met per pt report     OT SHORT TERM GOAL #7   Title Pt will perfom snack prep and light home management with min A .due 01/01/16   Baseline has only made coffee, folded towels   Time 4   Period Weeks   Status On-going     OT SHORT TERM GOAL #8   Title Pt will use LUE as a stabilizer/ gross assist for ADLS 22% of the time.   Time 4   Period Weeks   Status New     OT SHORT TERM GOAL  #9   TITLE I with updated HEP.   Time 4   Period Weeks   Status New           OT Long Term Goals - 12/01/16 1323      OT LONG  TERM GOAL #1   Title Pt will perform LB dressing with min A.   Time 12   Period Weeks   Status Achieved  10/01/16:  needs A for L shoe/brace     OT LONG TERM GOAL #2   Title Pt will use LUE as a stabilizer/ gross A with min A.   Time 12   Period Weeks   Status Achieved  uses for bathing, ,uses grossly 25 % of the time     OT LONG TERM GOAL #3   Title Pt will perform simple home management tasks with min A.- now  a short term goal   Time 12   Period Weeks   Status Not Met  not consistently performing has perfomed folding laundry     OT LONG TERM GOAL #4   Title Pt will demonstrate adequate standing balance to retrieve a light weight object from overhead cabinet modified independently with RUE without LOB in prep for IADLS.   Time 12   Period Weeks   Status Achieved     OT LONG TERM GOAL #5   Title Pt will perform shower transfers modified independently   Time 12   Period Weeks   Status Achieved     Long Term Additional Goals   Additional Long Term Goals Yes     OT LONG TERM GOAL #6   Title Pt will use LUE as a gross assist 50 % of the time for ADLs/ functional activity.   Time 8   Period Weeks   Status New     OT LONG TERM GOAL #7   Title Pt will demonstrate 25% finger flexion/ extension in prep for grasp release of a 1 inch block.   Time 4   Period Weeks   Status New     OT LONG TERM GOAL #8   Title Pt will demonstrate 30* of left shoulder flexion in prep for functional reach.   Time 8   Period Weeks   Status New               Plan - 12/21/16 1410    Clinical Impression Statement Pt continues to  make progress towards goals.  Pt progressing with incr wt. shift to L side and AAROM, but attention continues to affect consistency of performance.   Rehab Potential Good   Clinical Impairments Affecting Rehab Potential severity of deficits; cognitive deficits   OT Frequency 2x / week   OT Duration 8 weeks   OT Treatment/Interventions Self-care/ADL  training;Moist Heat;Fluidtherapy;DME and/or AE instruction;Splinting;Patient/family education;Balance Financial planner;Contrast Bath;Ultrasound;Therapeutic exercise;Therapeutic activities;Cognitive remediation/compensation;Passive range of motion;Functional Mobility Training;Neuromuscular education;Cryotherapy;Electrical Stimulation;Parrafin;Energy conservation;Manual Therapy;Visual/perceptual remediation/compensation   Plan assess STGs, continue with neuro re-ed, simple IADL   OT Home Exercise Plan 09/07/16:  resting splint wear/care; HEP   Consulted and Agree with Plan of Care Patient      Patient will benefit from skilled therapeutic intervention in order to improve the following deficits and impairments:  Abnormal gait, Decreased coordination, Decreased range of motion, Difficulty walking, Impaired flexibility, Impaired sensation, Increased edema, Decreased safety awareness, Decreased endurance, Decreased activity tolerance, Decreased knowledge of precautions, Increased fascial restricitons, Impaired tone, Pain, Impaired UE functional use, Decreased knowledge of use of DME, Decreased balance, Decreased cognition, Decreased mobility, Decreased strength, Impaired perceived functional ability, Impaired vision/preception  Visit Diagnosis: Hemiplegia and hemiparesis following cerebral infarction affecting left dominant side (HCC)  Other abnormalities of gait and mobility  Other lack of coordination  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 Left flaccid hemiplegia 06/24/2016  . Ear itching   . Poor fluid intake   . Bradycardia, drug induced   . Poor nutrition   . Muscle spasticity   . Obesity (BMI 30.0-34.9) 04/08/2016  . Hyperlipidemia 04/08/2016  . Basal ganglia hemorrhage ([place]) 04/08/2016  . Aphasia as late effect of cerebrovascular accident    . Dysphagia as late effect of cerebrovascular disease   . Migraine with aura and without status migrainosus, not intractable   . HLD (hyperlipidemia)   . Benign essential HTN   . Gait disturbance, post-stroke   . Hemiplegia, post-stroke ([place])   . Hypokalemia   . Dysphagia, post-stroke   . Aphasia, post-stroke   . Bradycardia   . Headache, migraine   . Dysarthria, post-stroke   . Encephalopathy acute 04/02/2016  . ICH (intracerebral hemorrhage) (HCC) - R basal ganglia due to hypertensive emergency 04/01/2016  . Upper airway cough syndrome 12/06/2015  . PCP NOTES >>> 09/25/2015  . Need for hepatitis B vaccination 10/19/2014  . Insomnia 12/28/2012  . Annual physical exam 11/23/2011  . Hemorrhoids 10/13/2011  . Morbid obesity ([place]) 02/26/2011  . CHEST PAIN UNSPECIFIED 03/28/2010  . Essential hypertension 10/19/2007  . Headache(784.0) 05/12/2007    Vianne Bulls 12/21/2016, 2:12 PM  [place] [address], [place], [address] Phone: [phone]   Fax:  [phone]  Name: LATOI GIRALDO MRN: [id number] Date of Birth: 09/19/1971   Vianne Bulls, OTR/L [facility] [address] [place], [place]  [address] [phone] phone [phone] 12/21/16 2:23 PM

## 2016-12-22 NOTE — Therapy (Signed)
[facility] [address] [place], [place], [address] Phone: [phone]   Fax:  [phone]  Physical Therapy Treatment  Patient Details  Name: Abigail Campos MRN: [id number] Date of Birth: 01-23-1971 Referring Provider: Dr. Posey Pronto  Encounter Date: 12/21/2016      PT End of Session - 12/21/16 1409    Visit Number 25   Number of Visits 41   Date for PT Re-Evaluation 01/28/17   Authorization Type [employer]-240 visit limit (including HH and OP). Per Lattie Haw, pt used 24 total HHPT, [place], and [place] visits.   PT Start Time 1404   PT Stop Time 1445   PT Time Calculation (min) 41 min   Equipment Utilized During Treatment Gait belt   Activity Tolerance Patient tolerated treatment well   Behavior During Therapy WFL for tasks assessed/performed      Past Medical History:  Diagnosis Date  . Essential hypertension 10/19/2007   03-2010: metoprolol changed to bystolic (was not feeling well on it, no specific allergy or reaction)    . Hypertension   . Stroke [facility])     Past Surgical History:  Procedure Laterality Date  . ABDOMINAL HYSTERECTOMY  11-14-07   no oophorectomy per surgical report  . CESAREAN SECTION     [id number]  . INGUINAL HERNIA REPAIR     2004    There were no vitals filed for this visit.      Subjective Assessment - 12/21/16 1408    Subjective "I am a little emotional today, my daughter just left for college". No falls or pain to report.    Patient is accompained by: Family member   Pertinent History HTN, hyperlipidemia, hx of migraine   Patient Stated Goals I want to walk and stand up without falling.    Currently in Pain? No/denies   Pain Score 0-No pain            [facility] Adult PT Treatment/Exercise - 12/21/16 1411      Transfers   Transfers Sit to Stand;Stand to Sit   Sit to Stand 5: Supervision;With upper extremity assist;From bed;From chair/3-in-1   Stand to Sit 5: Supervision;With upper  extremity assist;To bed;To chair/3-in-1     Ambulation/Gait   Ambulation/Gait Yes   Ambulation/Gait Assistance 5: Supervision   Ambulation/Gait Assistance Details cues for increased weight shifting onto left LE, increased right step length and for postural awareness   Ambulation Distance (Feet) 250 Feet  x1   Assistive device Small based quad cane   Gait Pattern Step-through pattern;Decreased stance time - left;Decreased step length - right;Decreased hip/knee flexion - left;Left genu recurvatum;Left flexed knee in stance;Narrow base of support   Ambulation Surface Level;Indoor     Neuro Re-ed    Neuro Re-ed Details  tall kneeling on mat with UE's on blue p-roll: mini squats 2 sets of 10 reps with emphasis on return to tall posture/hip extension, assist needed as pt fatigued with repititions     Knee/Hip Exercises: Supine   Bridges AROM;Strengthening;Both;2 sets;10 reps;Limitations   Bridges Limitations cues on ex form and technique with both double and single leg bridging, cues to keep left LE from falling out. right LE propped on stool with single leg bridging                             Single Leg Bridge AROM;Strengthening;Left;2 sets;10 reps;Limitations             PT  Short Term Goals - 12/01/16 1450      PT SHORT TERM GOAL #1   Title Pt will be IND in performing progressed HEP to improve strength and balance. TARGET DATE FOR ALL STGS: 12/29/16   Status New     PT SHORT TERM GOAL #2   Title Pt will improve BERG score to >/=48/56 to decr. falls risk.    Status New     PT SHORT TERM GOAL #3   Title Pt will amb. 400' over even/uneven terrain with LRAD at MOD I level to improve functional mobility.    Status New     PT SHORT TERM GOAL #4   Title Pt will trial [employer] and treadmill and write goals if appropriate to reintegrate pt into community exercises.   Status New           PT Long Term Goals - 12/01/16 1452      PT LONG TERM GOAL #1   Title Pt will verablize  plans to join fitness center to maintain gains made in PT. TARGET DATE FOR ALL LTGS: 01/26/17   Baseline ALL UNMET LTGS WILL BE CARRIED OVER TO NEW POC: 01/26/17   Status New     PT LONG TERM GOAL #2   Title Pt will improve gait speed to >/=1.39f/sec. with LRAD to reduce falls risk.    Baseline not met 12/01/16: 0.828fsec.   Status On-going     PT LONG TERM GOAL #3   Title Pt will amb. 400' over even/paved surfaces with LRAD at MOD I level to improve functional mobility.    Baseline partially met 12/01/16   Status On-going     PT LONG TERM GOAL #4   Title Pt will perform TUG with LRAD in </=13.5sec. to improve function and decr. falls risk.    Baseline Not met on 12/01/16: 27.3   Status On-going     PT LONG TERM GOAL #6   Title Pt will improve BERG score to >/=51/56 to decr. falls risk.    Baseline 44/56 on 12/01/16   Status On-going            Plan - 12/21/16 1410    Clinical Impression Statement today's session continued to address gait deficits and LLE weakness without any issues reported. Pt is making steady progress and should benefit from continued PT to progress toward unmet goals.    Rehab Potential Good   Clinical Impairments Affecting Rehab Potential co-morbidities   PT Frequency 2x / week   PT Duration 8 weeks  original POC 2x/week for 8 weeks   PT Treatment/Interventions ADLs/Self Care Home Management;Biofeedback;Canalith Repostioning;Electrical Stimulation;Cognitive remediation;Neuromuscular re-education;Balance training;Therapeutic exercise;Therapeutic activities;Functional mobility training;Stair training;Gait training;DME Instruction;Orthotic Fit/Training;Patient/family education;Vestibular;Manual techniques   PT Next Visit Plan Continue gait training with SBQC, strengthening LLE and balance training. Treadmill training.   PT Home Exercise Plan Strengthening HEP.    Consulted and Agree with Plan of Care Patient;Family member/caregiver   Family Member Consulted  pt's mom: [name]    Patient will benefit from skilled therapeutic intervention in order to improve the following deficits and impairments:  Decreased endurance, Decreased knowledge of use of DME, Decreased strength, Impaired UE functional use, Impaired sensation, Decreased balance, Decreased mobility, Decreased range of motion, Decreased coordination, Impaired flexibility, Decreased cognition, Abnormal gait  Visit Diagnosis: Hemiplegia and hemiparesis following cerebral infarction affecting left dominant side (HCC)  Other abnormalities of gait and mobility  Other lack of coordination     Problem List Patient Active  Problem List   Diagnosis Date Noted  . Left flaccid hemiplegia 06/24/2016  . Ear itching   . Poor fluid intake   . Bradycardia, drug induced   . Poor nutrition   . Muscle spasticity   . Obesity (BMI 30.0-34.9) 04/08/2016  . Hyperlipidemia 04/08/2016  . Basal ganglia hemorrhage ([place]) 04/08/2016  . Aphasia as late effect of cerebrovascular accident   . Dysphagia as late effect of cerebrovascular disease   . Migraine with aura and without status migrainosus, not intractable   . HLD (hyperlipidemia)   . Benign essential HTN   . Gait disturbance, post-stroke   . Hemiplegia, post-stroke ([place])   . Hypokalemia   . Dysphagia, post-stroke   . Aphasia, post-stroke   . Bradycardia   . Headache, migraine   . Dysarthria, post-stroke   . Encephalopathy acute 04/02/2016  . ICH (intracerebral hemorrhage) (HCC) - R basal ganglia due to hypertensive emergency 04/01/2016  . Upper airway cough syndrome 12/06/2015  . PCP NOTES >>> 09/25/2015  . Need for hepatitis B vaccination 10/19/2014  . Insomnia 12/28/2012  . Annual physical exam 11/23/2011  . Hemorrhoids 10/13/2011  . Morbid obesity ([place]) 02/26/2011  . CHEST PAIN UNSPECIFIED 03/28/2010  . Essential hypertension 10/19/2007  . WTGRMBOB(499.6) 05/12/2007    Willow Ora, PTA, [place] [address], [place],  [address] [phone] 12/22/16, 1:21 PM   Name: ROYANNE WARSHAW MRN: [id number] Date of Birth: 02-13-1971

## 2016-12-24 ENCOUNTER — Encounter: Payer: Self-pay | Admitting: Physical Therapy

## 2016-12-24 ENCOUNTER — Ambulatory Visit: Payer: Managed Care, Other (non HMO) | Admitting: Physical Therapy

## 2016-12-24 ENCOUNTER — Ambulatory Visit: Payer: Managed Care, Other (non HMO) | Admitting: Occupational Therapy

## 2016-12-24 DIAGNOSIS — R278 Other lack of coordination: Secondary | ICD-10-CM

## 2016-12-24 DIAGNOSIS — I69352 Hemiplegia and hemiparesis following cerebral infarction affecting left dominant side: Secondary | ICD-10-CM

## 2016-12-24 DIAGNOSIS — R2689 Other abnormalities of gait and mobility: Secondary | ICD-10-CM

## 2016-12-24 DIAGNOSIS — I69115 Cognitive social or emotional deficit following nontraumatic intracerebral hemorrhage: Secondary | ICD-10-CM

## 2016-12-24 DIAGNOSIS — R2681 Unsteadiness on feet: Secondary | ICD-10-CM

## 2016-12-24 NOTE — Therapy (Signed)
[facility] [address] [place], [place], [address] Phone: [phone]   Fax:  [phone]  Occupational Therapy Treatment  Patient Details  Name: Abigail Campos MRN: [id number] Date of Birth: 09-14-1971 Referring Provider: Dr. Allena Katz  Encounter Date: 12/24/2016      OT End of Session - 12/24/16 1723    Visit Number 23   Number of Visits 35   Date for OT Re-Evaluation 01/31/16   Authorization Type Aetna (240 visit limit combined with home health, estimated HH used 24 combined), no auth required    Authorization Time Period week 4/8- renewed 12/01/16   OT Start Time 1451  pt arrived late   OT Stop Time 1530   OT Time Calculation (min) 39 min   Activity Tolerance Patient tolerated treatment well   Behavior During Therapy [facility] for tasks assessed/performed      Past Medical History:  Diagnosis Date  . Essential hypertension 10/19/2007   03-2010: metoprolol changed to bystolic (was not feeling well on it, no specific allergy or reaction)    . Hypertension   . Stroke [facility])     Past Surgical History:  Procedure Laterality Date  . ABDOMINAL HYSTERECTOMY  11-14-07   no oophorectomy per surgical report  . CESAREAN SECTION     [id number]  . INGUINAL HERNIA REPAIR     2004    There were no vitals filed for this visit.      Subjective Assessment - 12/24/16 1456    Subjective  "I am being nosey again"  Pt reports that she has folded laundry (but shirts were hard) and cooked breakfast and lunch.  Pt also reports using hand-over-hand to brush hair with LUE.   Pertinent History see Epic   Patient Stated Goals to be able to use left arm   Currently in Pain? No/denies        In supine,  AAROM elbow flex/ext, shoulder abduction/adduction, shoulder flex.  Pt performed with min-mod facilitation (inconsistent with decr attention, particularly with external distractions of noise, light, smell).  In supine, AAROM  chest press and shoulder flex with PVC frame with min-mod facilitation (inconsistent due to attention deficits).    In supine, roll to the L for wt. Bearing thought L side/arm for supine>sitting   In standing, focus on midline alignment and focus on wt. Bearing through BUEs on table mat with mod facilitation/cues.  Then standing with focus/cueing for midline alignment and shoulder alignment/positioning (decr IR).    In standing, folding clothes successfully, but min-mod cueing for midline alignment and wt. Shift to L side for incr activation (close supervision provided while working on this and as pt not wearing AFO due to LLE edema today).  Discussed progress with IADLs and ways to incr LUE functional use and IADL participation.  Pt verbalized understanding.                         OT Short Term Goals - 12/24/16 1457      OT SHORT TERM GOAL #1   Title Pt/ caregiver will be I with HEP.   Time 6   Period Weeks   Status Achieved     OT SHORT TERM GOAL #2   Title Pt/ family will verbalize understanding of LUE positioning to minimize pain and risk for injury, including splinting PRN.   Time 6   Period Weeks   Status Achieved  10/01/16, 10/26/16     OT SHORT  TERM GOAL #3   Title Pt will donn shirt with min A.   Time 6   Period Weeks   Status Achieved  10/01/16  mod I     OT SHORT TERM GOAL #4   Title Pt will demonstrate 90* P/ROM shoulder flexion in LUE with pain less than or equal to 3/10.   Time 6   Period Weeks   Status Achieved  10/06/16 met to 90* with no pain     OT SHORT TERM GOAL #5   Title Pt will perform simple snack prep modified independently.   Time 6   Period Weeks   Status Achieved    12/01/16 made coffee.  12/16/15:  met per pt report     OT SHORT TERM GOAL #6   Title Pt will perform bathing at a supervision level   Time 6   Period Weeks   Status Achieved  10/01/16 met per pt report     OT SHORT TERM GOAL #7   Title Pt will perfom  snack prep and light home management with min A .due 01/01/16   Baseline has only made coffee, folded towels   Time 4   Period Weeks   Status Achieved  12/25/15:  made breakfast/lunch, laundry     OT SHORT TERM GOAL #8   Title Pt will use LUE as a stabilizer/ gross assist for ADLS 17% of the time.   Time 4   Period Weeks   Status On-going     OT SHORT TERM GOAL  #9   TITLE I with updated HEP.   Time 4   Period Weeks   Status On-going           OT Long Term Goals - 12/01/16 1323      OT LONG TERM GOAL #1   Title Pt will perform LB dressing with min A.   Time 12   Period Weeks   Status Achieved  10/01/16:  needs A for L shoe/brace     OT LONG TERM GOAL #2   Title Pt will use LUE as a stabilizer/ gross A with min A.   Time 12   Period Weeks   Status Achieved  uses for bathing, ,uses grossly 25 % of the time     OT LONG TERM GOAL #3   Title Pt will perform simple home management tasks with min A.- now  a short term goal   Time 12   Period Weeks   Status Not Met  not consistently performing has perfomed folding laundry     OT LONG TERM GOAL #4   Title Pt will demonstrate adequate standing balance to retrieve a light weight object from overhead cabinet modified independently with RUE without LOB in prep for IADLS.   Time 12   Period Weeks   Status Achieved     OT LONG TERM GOAL #5   Title Pt will perform shower transfers modified independently   Time 12   Period Weeks   Status Achieved     Long Term Additional Goals   Additional Long Term Goals Yes     OT LONG TERM GOAL #6   Title Pt will use LUE as a gross assist 50 % of the time for ADLs/ functional activity.   Time 8   Period Weeks   Status New     OT LONG TERM GOAL #7   Title Pt will demonstrate 25% finger flexion/ extension in prep for grasp release of a  1 inch block.   Time 4   Period Weeks   Status New     OT LONG TERM GOAL #8   Title Pt will demonstrate 30* of left shoulder flexion in prep  for functional reach.   Time 8   Period Weeks   Status New               Plan - 12/24/16 1723    Clinical Impression Statement Pt continues to make progress towards goals with STG #7 now met.  Pt performs better in quiet environment as she is sensitive to external distractions.   Rehab Potential Good   Clinical Impairments Affecting Rehab Potential severity of deficits; cognitive deficits   OT Frequency 2x / week   OT Duration 8 weeks   OT Treatment/Interventions Self-care/ADL training;Moist Heat;Fluidtherapy;DME and/or AE instruction;Splinting;Patient/family education;Balance Financial planner;Contrast Bath;Ultrasound;Therapeutic exercise;Therapeutic activities;Cognitive remediation/compensation;Passive range of motion;Functional Mobility Training;Neuromuscular education;Cryotherapy;Electrical Stimulation;Parrafin;Energy conservation;Manual Therapy;Visual/perceptual remediation/compensation   Plan placing laundry to/from washer/dryer; update HEP as able: add OT visits through current POC   OT Home Exercise Plan 09/07/16:  resting splint wear/care; HEP   Consulted and Agree with Plan of Care Patient      Patient will benefit from skilled therapeutic intervention in order to improve the following deficits and impairments:  Abnormal gait, Decreased coordination, Decreased range of motion, Difficulty walking, Impaired flexibility, Impaired sensation, Increased edema, Decreased safety awareness, Decreased endurance, Decreased activity tolerance, Decreased knowledge of precautions, Increased fascial restricitons, Impaired tone, Pain, Impaired UE functional use, Decreased knowledge of use of DME, Decreased balance, Decreased cognition, Decreased mobility, Decreased strength, Impaired perceived functional ability, Impaired vision/preception  Visit Diagnosis: Hemiplegia and hemiparesis following cerebral infarction affecting left dominant side (HCC)  Other abnormalities of gait and  mobility  Other lack of coordination  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 Left flaccid hemiplegia 06/24/2016  . Ear itching   . Poor fluid intake   . Bradycardia, drug induced   . Poor nutrition   . Muscle spasticity   . Obesity (BMI 30.0-34.9) 04/08/2016  . Hyperlipidemia 04/08/2016  . Basal ganglia hemorrhage ([place]) 04/08/2016  . Aphasia as late effect of cerebrovascular accident   . Dysphagia as late effect of cerebrovascular disease   . Migraine with aura and without status migrainosus, not intractable   . HLD (hyperlipidemia)   . Benign essential HTN   . Gait disturbance, post-stroke   . Hemiplegia, post-stroke ([place])   . Hypokalemia   . Dysphagia, post-stroke   . Aphasia, post-stroke   . Bradycardia   . Headache, migraine   . Dysarthria, post-stroke   . Encephalopathy acute 04/02/2016  . ICH (intracerebral hemorrhage) (HCC) - R basal ganglia due to hypertensive emergency 04/01/2016  . Upper airway cough syndrome 12/06/2015  . PCP NOTES >>> 09/25/2015  . Need for hepatitis B vaccination 10/19/2014  . Insomnia 12/28/2012  . Annual physical exam 11/23/2011  . Hemorrhoids 10/13/2011  . Morbid obesity ([place]) 02/26/2011  . CHEST PAIN UNSPECIFIED 03/28/2010  . Essential hypertension 10/19/2007  . Headache(784.0) 05/12/2007    [facility] 12/24/2016, 5:27 PM  [place] [address] [place], [place], [address] Phone: [phone]   Fax:  [phone]  Name: Abigail Campos MRN: [id number] Date of Birth: 1971/06/27   Vianne Bulls, OTR/L [facility] [place], [place]  [address] [phone] phone [phone] 12/24/16 5:32 PM

## 2016-12-27 NOTE — Therapy (Signed)
Abigail Campos [address] [place], [place], [address] Phone: [phone]   Fax:  [phone]  Physical Therapy Treatment  Patient Details  Name: Abigail Campos MRN: [id number] Date of Birth: 10-16-1971 Referring Provider: Dr. Posey Pronto  Encounter Date: 12/24/2016    12/24/16 1539  PT Visits / Re-Eval  Visit Number 26  Number of Visits 41  Date for PT Re-Evaluation 01/28/17  Authorization  Authorization Type [employer]-240 visit limit (including HH and OP). Per Lattie Haw, pt used 24 total HHPT, [place], and [place] visits.  PT Time Calculation  PT Start Time 1532  PT Stop Time 1613  PT Time Calculation (min) 41 min  PT - End of Session  Equipment Utilized During Treatment Gait belt  Activity Tolerance Patient tolerated treatment well  Behavior During Therapy WFL for tasks assessed/performed     Past Medical History:  Diagnosis Date  . Essential hypertension 10/19/2007   03-2010: metoprolol changed to bystolic (was not feeling well on it, no specific allergy or reaction)    . Hypertension   . Stroke [facility])     Past Surgical History:  Procedure Laterality Date  . ABDOMINAL HYSTERECTOMY  11-14-07   no oophorectomy per surgical report  . CESAREAN SECTION     [id number]  . INGUINAL HERNIA REPAIR     2004    There were no vitals filed for this visit.     12/24/16 1538  Symptoms/Limitations  Subjective No new complaints. No pain or falls to report. Was not able to get her brace on due to left LE/foot swelling.   Patient is accompained by: Family member  Pertinent History HTN, hyperlipidemia, hx of migraine  Patient Stated Goals I want to walk and stand up without falling.   Pain Assessment  Currently in Pain? No/denies  Pain Score 0      12/24/16 1540  Transfers  Transfers Sit to Stand;Stand to Sit  Number of Reps 10 reps;1 set  Comments with right foot resting on 4 inch box: verbal/visual/tactile cues to maintain  midline posture with sit<>stands   Ambulation/Gait  Ambulation/Gait Yes  Ambulation/Gait Assistance 5: Supervision  Ambulation/Gait Assistance Details cues on step length and for increased weight shifting onto left leg with gait  Ambulation Distance (Feet) 250 Feet  Assistive device Small based quad cane  Gait Pattern Step-through pattern;Decreased stance time - left;Decreased step length - right;Decreased hip/knee flexion - left;Left genu recurvatum;Left flexed knee in stance;Narrow base of support  Ambulation Surface Level;Indoor  Neuro Re-ed   Neuro Re-ed Details  at stairs with right UE support: left foot taps up<>down bottom 2 steps x 10 reps with min assist for hip/knee flexion/decreased circumduction;                           Knee/Hip Exercises: Standing  Forward Step Up Left;1 set;10 reps;Hand Hold: 1;Step Height: 6";Limitations  Forward Step Up Limitations single UE support: cues on ex form and technique           PT Short Term Goals - 12/01/16 1450      PT SHORT TERM GOAL #1   Title Pt will be IND in performing progressed HEP to improve strength and balance. TARGET DATE FOR ALL STGS: 12/29/16   Status New     PT SHORT TERM GOAL #2   Title Pt will improve BERG score to >/=48/56 to decr. falls risk.    Status New  PT SHORT TERM GOAL #3   Title Pt will amb. 400' over even/uneven terrain with LRAD at MOD I level to improve functional mobility.    Status New     PT SHORT TERM GOAL #4   Title Pt will trial Sci[employer] and treadmill and write goals if appropriate to reintegrate pt into community exercises.   Status New           PT Long Term Goals - 12/01/16 1452      PT LONG TERM GOAL #1   Title Pt will verablize plans to join fitness center to maintain gains made in PT. TARGET DATE FOR ALL LTGS: 01/26/17   Baseline ALL UNMET LTGS WILL BE CARRIED OVER TO NEW POC: 01/26/17   Status New     PT LONG TERM GOAL #2   Title Pt will improve gait speed to >/=1.43f/sec. with  LRAD to reduce falls risk.    Baseline not met 12/01/16: 0.850fsec.   Status On-going     PT LONG TERM GOAL #3   Title Pt will amb. 400' over even/paved surfaces with LRAD at MOD I level to improve functional mobility.    Baseline partially met 12/01/16   Status On-going     PT LONG TERM GOAL #4   Title Pt will perform TUG with LRAD in </=13.5sec. to improve function and decr. falls risk.    Baseline Not met on 12/01/16: 27.3   Status On-going     PT LONG TERM GOAL #6   Title Pt will improve BERG score to >/=51/56 to decr. falls risk.    Baseline 44/56 on 12/01/16   Status On-going        12/24/16 1539  Plan  Clinical Impression Statement Today's session continued to focus on gait with quad cane and LLE weakness with only fatigue reported. Pt continues to make steady progress toward goals and should benefit from continued PT to progress toward unmet goals .  Pt will benefit from skilled therapeutic intervention in order to improve on the following deficits Decreased endurance;Decreased knowledge of use of DME;Decreased strength;Impaired UE functional use;Impaired sensation;Decreased balance;Decreased mobility;Decreased range of motion;Decreased coordination;Impaired flexibility;Decreased cognition;Abnormal gait  Rehab Potential Good  Clinical Impairments Affecting Rehab Potential co-morbidities  PT Frequency 2x / week  PT Duration 8 weeks (original POC 2x/week for 8 weeks)  PT Treatment/Interventions ADLs/Self Care Home Management;Biofeedback;Canalith Repostioning;Electrical Stimulation;Cognitive remediation;Neuromuscular re-education;Balance training;Therapeutic exercise;Therapeutic activities;Functional mobility training;Stair training;Gait training;DME Instruction;Orthotic Fit/Training;Patient/family education;Vestibular;Manual techniques  PT Next Visit Plan Continue gait training with SBQC, strengthening LLE and balance training. Treadmill training.  PT Home Exercise Plan  Strengthening HEP.   Consulted and Agree with Plan of Care Patient;Family member/caregiver  Family Member Consulted pt's mom: [name]    Patient will benefit from skilled therapeutic intervention in order to improve the following deficits and impairments:  Decreased endurance, Decreased knowledge of use of DME, Decreased strength, Impaired UE functional use, Impaired sensation, Decreased balance, Decreased mobility, Decreased range of motion, Decreased coordination, Impaired flexibility, Decreased cognition, Abnormal gait  Visit Diagnosis: Hemiplegia and hemiparesis following cerebral infarction affecting left dominant side (HCC)  Other abnormalities of gait and mobility  Other lack of coordination  Unsteadiness on feet     Problem List Patient Active Problem List   Diagnosis Date Noted  . Left flaccid hemiplegia 06/24/2016  . Ear itching   . Poor fluid intake   . Bradycardia, drug induced   . Poor nutrition   . Muscle spasticity   . Obesity (  BMI 30.0-34.9) 04/08/2016  . Hyperlipidemia 04/08/2016  . Basal ganglia hemorrhage ([place]) 04/08/2016  . Aphasia as late effect of cerebrovascular accident   . Dysphagia as late effect of cerebrovascular disease   . Migraine with aura and without status migrainosus, not intractable   . HLD (hyperlipidemia)   . Benign essential HTN   . Gait disturbance, post-stroke   . Hemiplegia, post-stroke ([place])   . Hypokalemia   . Dysphagia, post-stroke   . Aphasia, post-stroke   . Bradycardia   . Headache, migraine   . Dysarthria, post-stroke   . Encephalopathy acute 04/02/2016  . ICH (intracerebral hemorrhage) (HCC) - R basal ganglia due to hypertensive emergency 04/01/2016  . Upper airway cough syndrome 12/06/2015  . PCP NOTES >>> 09/25/2015  . Need for hepatitis B vaccination 10/19/2014  . Insomnia 12/28/2012  . Annual physical exam 11/23/2011  . Hemorrhoids 10/13/2011  . Morbid obesity ([place]) 02/26/2011  . CHEST PAIN UNSPECIFIED  03/28/2010  . Essential hypertension 10/19/2007  . CMKLKJZP(915.0) 05/12/2007    Willow Ora, PTA, [facility] [address] [place], [place] [address] [phone] 12/27/16, 9:59 PM   Name: Abigail Campos MRN: [id number] Date of Birth: 1971-01-28

## 2016-12-28 ENCOUNTER — Encounter: Payer: Self-pay | Admitting: Internal Medicine

## 2016-12-28 ENCOUNTER — Ambulatory Visit (INDEPENDENT_AMBULATORY_CARE_PROVIDER_SITE_OTHER): Payer: Managed Care, Other (non HMO) | Admitting: Internal Medicine

## 2016-12-28 VITALS — BP 118/74 | HR 71 | Temp 98.2°F | Resp 14 | Ht 59.0 in | Wt 143.5 lb

## 2016-12-28 DIAGNOSIS — I610 Nontraumatic intracerebral hemorrhage in hemisphere, subcortical: Secondary | ICD-10-CM

## 2016-12-28 DIAGNOSIS — H9311 Tinnitus, right ear: Secondary | ICD-10-CM

## 2016-12-28 DIAGNOSIS — R739 Hyperglycemia, unspecified: Secondary | ICD-10-CM | POA: Diagnosis not present

## 2016-12-28 DIAGNOSIS — I10 Essential (primary) hypertension: Secondary | ICD-10-CM

## 2016-12-28 DIAGNOSIS — F329 Major depressive disorder, single episode, unspecified: Secondary | ICD-10-CM

## 2016-12-28 DIAGNOSIS — I69359 Hemiplegia and hemiparesis following cerebral infarction affecting unspecified side: Secondary | ICD-10-CM

## 2016-12-28 DIAGNOSIS — E785 Hyperlipidemia, unspecified: Secondary | ICD-10-CM

## 2016-12-28 DIAGNOSIS — R7303 Prediabetes: Secondary | ICD-10-CM

## 2016-12-28 DIAGNOSIS — Z23 Encounter for immunization: Secondary | ICD-10-CM | POA: Diagnosis not present

## 2016-12-28 DIAGNOSIS — F32A Depression, unspecified: Secondary | ICD-10-CM

## 2016-12-28 LAB — BASIC METABOLIC PANEL
BUN: 8 mg/dL (ref 6–23)
CO2: 29 mEq/L (ref 19–32)
Calcium: 9.2 mg/dL (ref 8.4–10.5)
Chloride: 103 mEq/L (ref 96–112)
Creatinine, Ser: 0.78 mg/dL (ref 0.40–1.20)
GFR: 103.59 mL/min (ref >60.00)
Glucose, Bld: 102 mg/dL — ABNORMAL HIGH (ref 70–99)
Potassium: 3.4 mEq/L — ABNORMAL LOW (ref 3.5–5.1)
Sodium: 137 mEq/L (ref 135–145)

## 2016-12-28 LAB — HEMOGLOBIN A1C: Hgb A1c MFr Bld: 5.6 % (ref 4.6–6.5)

## 2016-12-28 MED ORDER — FLUOXETINE HCL 20 MG PO TABS: 40.0000 mg | ORAL_TABLET | Freq: Every day | ORAL | 3 refills | Status: DC

## 2016-12-28 NOTE — Patient Instructions (Signed)
GO TO THE LAB : Get the blood work     GO TO THE FRONT DESK Schedule your next appointment for a   checkup in 3 months, fasting   Increase fluoxetine 20 mg to 2 tablets daily.  Okay to decrease it to 1.5 tablets daily if needed.

## 2016-12-28 NOTE — Progress Notes (Signed)
Pre visit review using our clinic review tool, if applicable. No additional management support is needed unless otherwise documented below in the visit note. 

## 2016-12-28 NOTE — Progress Notes (Signed)
Subjective:    Patient ID: Abigail Campos, female    DOB: 1971-02-28, 46 y.o.   MRN: [id number]  DOS:  12/28/2016 Type of visit - description :  Routine checkup Interval history: Stroke sequela: closesly follow-up by neurology and rehabilitation. She is on baclofen, thinks is causing tinnitus, subjectively is on the right side. Alternatives?. Depression: Back in October she restarted fluoxetine, feels better, still is emotional on-off. HTN: Good med compliance, ambulatory BP is excellent, usually 120/70.   Review of Systems Denies chest pain or difficulty breathing No nausea, vomiting, diarrhea No suicidal ideas No headaches Denies decreased hearing  Past Medical History:  Diagnosis Date  . Essential hypertension 10/19/2007   03-2010: metoprolol changed to bystolic (was not feeling well on it, no specific allergy or reaction)    . Hypertension   . Stroke [facility])     Past Surgical History:  Procedure Laterality Date  . ABDOMINAL HYSTERECTOMY  11-14-07   no oophorectomy per surgical report  . CESAREAN SECTION     [id number]  . INGUINAL HERNIA REPAIR     2004    Social History   Social History  . Marital status: Married    Spouse name: N/A  . Number of children: 2  . Years of education: N/A   Occupational History  . [employer]   Social History Main Topics  . Smoking status: Never Smoker  . Smokeless tobacco: Never Used  . Alcohol use No  . Drug use: No  . Sexual activity: Not on file   Other Topics Concern  . Not on file   Social History Narrative   Used to leve independently, stroke 03-2016, d/c from rehab to his mother's house 04-2016.   1 child in college, another child w/ her       Allergies as of 12/28/2016      Reactions   Bee Venom Anaphylaxis   Peanut-containing Drug Products Anaphylaxis   Hydrocodone Hives   Generalize itching w/o rash   Isovue [iopamidol] Hives, Itching   Pt broke out with one facial hive.  Had itching on her  right upper ant and posterior arm w/o evidence of hives.  Pt given water and 25mg  benadryl po.  We observed pt for 30 minutes before d/c.  J. Bohm     Ambien [zolpidem Tartrate] Other (See Comments)   forgetfulness   Aspirin Swelling   Reports blisters w/ ASA but pt reports is ok w/ Motrin, Advil, naproxen   Latex Hives      Medication List       Accurate as of 12/28/16 11:59 PM. Always use your most recent med list.          atorvastatin 20 MG tablet Commonly known as:  LIPITOR Take 1 tablet (20 mg total) by mouth daily at 6 PM.   baclofen 10 MG tablet Commonly known as:  LIORESAL TAKE 1 TABLET(10 MG) BY MOUTH THREE TIMES DAILY   butalbital-acetaminophen-caffeine 50-325-40 MG tablet Commonly known as:  FIORICET, ESGIC Take 1 tablet by mouth every 4 (four) hours as needed for headache.   clopidogrel 75 MG tablet Commonly known as:  PLAVIX Take 1 tablet (75 mg total) by mouth daily.   FLUoxetine 20 MG tablet Commonly known as:  PROZAC Take 2 tablets (40 mg total) by mouth daily.   furosemide 20 MG tablet Commonly known as:  LASIX Take 1 tablet (20 mg total) by mouth daily.   lisinopril 2.5 MG tablet Commonly known as:  PRINIVIL,ZESTRIL Take 1 tablet (2.5 mg total) by mouth daily.   nebivolol 5 MG tablet Commonly known as:  BYSTOLIC Take 1 tablet (5 mg total) by mouth every 12 (twelve) hours.   pantoprazole 40 MG tablet Commonly known as:  PROTONIX Take 40 mg by mouth daily.   VITAMIN D PO Take 1 tablet by mouth daily.            Durable Medical Equipment        Start     Ordered   12/28/16 0000  For home use only DME Cane    CommentsWallie Char Dx:I61.9 [id number]   12/28/16 1127         Objective:   Physical Exam BP 118/74 (BP Location: Right Arm, Patient Position: Sitting, Cuff Size: Normal)   Pulse 71   Temp 98.2 F (36.8 C) (Oral)   Resp 14   Ht 4\' 11"  (1.499 m)   Wt 143 lb 8 oz (65.1 kg)   SpO2 98%   BMI 28.98 kg/m    General:   Well developed, well nourished . NAD.  HEENT:  Normocephalic . Face symmetric, atraumatic. TMs normal Lungs:  CTA B Normal respiratory effort, no intercostal retractions, no accessory muscle use. Heart: RRR,  no murmur.  No pretibial edema bilaterally  Skin: Not pale. Not jaundice Neurologic:  alert & oriented X3.  Speech normal, needs in a wheelchair, able to walk without a cane with great difficulty Psych--  Cognition and judgment appear intact.  Cooperative with normal attention span and concentration.  Behavior appropriate. No anxious or depressed appearing.      Assessment & Plan:   Assessment > Prediabetes: A1c 6.0 (April 2017)  HTNX line hyperlipidemia Depression GERD Stroke 03-2016:  ICH, right basilar ganglia due to HTN emergency, + encephalopathy, + dysphagia, aphasia, dysarthria Residual L spasticity  Headaches , migraines dx after 2nd pregnancy, (-) CT head 2014 and 2015 Insomnia  Cough, persisting, saw Dr. Melvyn Novas 11-2015  PLAN: Prediabetes: Check A1c. Obviously unable to exercise but is trying to watch her diet HTN: Continue Lasix, lisinopril, bystolic. Check a BMP High cholesterol: Under excellent control Stroke sequela: Lives with her mother, uses a wheelchair, is doing some walking with difficulty. Request a 4 prong cane prescription. Will do. Tinnitus: Reports R tinnitus, has been told is due to baclofen, recommended a trial without baclofen to confirm, she declined it because she gets very stiff. Declined ENT referral. Recommend to discuss with Dr. Posey Pronto alternatives for baclofen. Depression: Currently on fluoxetine 20-still emotional, increase to 40 mg. Okay to drop to 30 if she feels 40 is too much. Primary care: Prevnar today RTC 3 months

## 2016-12-29 ENCOUNTER — Ambulatory Visit: Payer: Managed Care, Other (non HMO) | Admitting: Physical Therapy

## 2016-12-29 ENCOUNTER — Encounter: Payer: Self-pay | Admitting: Physical Therapy

## 2016-12-29 ENCOUNTER — Ambulatory Visit: Payer: Managed Care, Other (non HMO) | Admitting: Occupational Therapy

## 2016-12-29 VITALS — BP 121/89 | HR 86

## 2016-12-29 DIAGNOSIS — R278 Other lack of coordination: Secondary | ICD-10-CM

## 2016-12-29 DIAGNOSIS — F419 Anxiety disorder, unspecified: Secondary | ICD-10-CM

## 2016-12-29 DIAGNOSIS — I69352 Hemiplegia and hemiparesis following cerebral infarction affecting left dominant side: Secondary | ICD-10-CM | POA: Diagnosis not present

## 2016-12-29 DIAGNOSIS — R2689 Other abnormalities of gait and mobility: Secondary | ICD-10-CM

## 2016-12-29 DIAGNOSIS — M25512 Pain in left shoulder: Secondary | ICD-10-CM

## 2016-12-29 DIAGNOSIS — R2681 Unsteadiness on feet: Secondary | ICD-10-CM

## 2016-12-29 DIAGNOSIS — F329 Major depressive disorder, single episode, unspecified: Secondary | ICD-10-CM | POA: Insufficient documentation

## 2016-12-29 DIAGNOSIS — F32A Depression, unspecified: Secondary | ICD-10-CM | POA: Insufficient documentation

## 2016-12-29 DIAGNOSIS — R739 Hyperglycemia, unspecified: Secondary | ICD-10-CM | POA: Insufficient documentation

## 2016-12-29 NOTE — Assessment & Plan Note (Signed)
Prediabetes: Check A1c. Obviously unable to exercise but is trying to watch her diet HTN: Continue Lasix, lisinopril, bystolic. Check a BMP High cholesterol: Under excellent control Stroke sequela: Lives with her mother, uses a wheelchair, is doing some walking with difficulty. Request a 4 prong cane prescription. Will do. Tinnitus: Reports R tinnitus, has been told is due to baclofen, recommended a trial without baclofen to confirm, she declined it because she gets very stiff. Declined ENT referral. Recommend to discuss with Dr. Posey Pronto alternatives for baclofen. Depression: Currently on fluoxetine 20-still emotional, increase to 40 mg. Okay to drop to 30 if she feels 40 is too much. Primary care: Prevnar today RTC 3 months

## 2016-12-29 NOTE — Therapy (Signed)
[facility] [address] [place], [place], [address] Phone: [phone]   Fax:  [phone]  Occupational Therapy Treatment  Patient Details  Name: Abigail Campos MRN: [id number] Date of Birth: 10/23/1971 Referring Provider: Dr. Posey Pronto  Encounter Date: 12/29/2016      OT End of Session - 12/29/16 1521    Visit Number 24   Number of Visits 35   Date for OT Re-Evaluation 01/31/16   Authorization Type A[employer]na (240 visit limit combined with home health, estimated HH used 24 combined), no auth required    Authorization Time Period week 5/8- renewed 12/01/16   OT Start Time 1448   OT Stop Time 1530   OT Time Calculation (min) 42 min   Activity Tolerance Patient tolerated treatment well   Behavior During Therapy [facility] for tasks assessed/performed      Past Medical History:  Diagnosis Date  . Essential hypertension 10/19/2007   03-2010: metoprolol changed to bystolic (was not feeling well on it, no specific allergy or reaction)    . Hypertension   . Stroke [facility])     Past Surgical History:  Procedure Laterality Date  . ABDOMINAL HYSTERECTOMY  11-14-07   no oophorectomy per surgical report  . CESAREAN SECTION     [id number]  . INGUINAL HERNIA REPAIR     2004    There were no vitals filed for this visit.      Subjective Assessment - 12/29/16 1520    Subjective  Pt reports arm pain from pneumonia vaccine   Pertinent History see Epic   Patient Stated Goals to be able to use left arm   Pain Score 6    Pain Location Arm   Pain Orientation Left   Pain Descriptors / Indicators Sore   Pain Type Acute pain   Pain Onset Yesterday   Pain Frequency Constant   Aggravating Factors  PNA shot   Pain Relieving Factors rest, ice        Treatment: supine gentle P/ROM to fingers and wrist of LUE followed by AA/ROM shoulder flexion, elbow flexion/ extension. Supine lower trunk rotation for spasticity management. Seated  gentle weightbearing through LUE edge of mat with mod facilitation then weightbearing through tilted stool with mod facilitation. NMES 50 pps, 250 pw, 10 secs cycle intensity 21 to wrist and finger extensors, no adverse reactions, min facilitation.                         OT Short Term Goals - 12/29/16 1521      OT SHORT TERM GOAL #1   Title Pt/ caregiver will be I with HEP.   Time 6   Period Weeks   Status Achieved     OT SHORT TERM GOAL #2   Title Pt/ family will verbalize understanding of LUE positioning to minimize pain and risk for injury, including splinting PRN.   Time 6   Period Weeks   Status Achieved  10/01/16, 10/26/16     OT SHORT TERM GOAL #3   Title Pt will donn shirt with min A.   Time 6   Period Weeks   Status Achieved  10/01/16  mod I     OT SHORT TERM GOAL #4   Title Pt will demonstrate 90* P/ROM shoulder flexion in LUE with pain less than or equal to 3/10.   Time 6   Period Weeks   Status Achieved  10/06/16 met to 20* with no  pain     OT SHORT TERM GOAL #5   Title Pt will perform simple snack prep modified independently.   Time 6   Period Weeks   Status Achieved    12/01/16 made coffee.  12/16/15:  met per pt report     OT SHORT TERM GOAL #6   Title Pt will perform bathing at a supervision level   Time 6   Period Weeks   Status Achieved  10/01/16 met per pt report     OT SHORT TERM GOAL #7   Title Pt will perfom snack prep and light home management with min A .due 01/01/16   Baseline has only made coffee, folded towels   Time 4   Period Weeks   Status Achieved  12/25/15:  made breakfast/lunch, laundry     OT SHORT TERM GOAL #8   Title Pt will use LUE as a stabilizer/ gross assist for ADLS 78% of the time.   Time 4   Period Weeks   Status On-going  25%     OT SHORT TERM GOAL  #9   TITLE I with updated HEP.   Time 4   Period Weeks   Status On-going           OT Long Term Goals - 12/01/16 1323      OT LONG TERM  GOAL #1   Title Pt will perform LB dressing with min A.   Time 12   Period Weeks   Status Achieved  10/01/16:  needs A for L shoe/brace     OT LONG TERM GOAL #2   Title Pt will use LUE as a stabilizer/ gross A with min A.   Time 12   Period Weeks   Status Achieved  uses for bathing, ,uses grossly 25 % of the time     OT LONG TERM GOAL #3   Title Pt will perform simple home management tasks with min A.- now  a short term goal   Time 12   Period Weeks   Status Not Met  not consistently performing has perfomed folding laundry     OT LONG TERM GOAL #4   Title Pt will demonstrate adequate standing balance to retrieve a light weight object from overhead cabinet modified independently with RUE without LOB in prep for IADLS.   Time 12   Period Weeks   Status Achieved     OT LONG TERM GOAL #5   Title Pt will perform shower transfers modified independently   Time 12   Period Weeks   Status Achieved     Long Term Additional Goals   Additional Long Term Goals Yes     OT LONG TERM GOAL #6   Title Pt will use LUE as a gross assist 50 % of the time for ADLs/ functional activity.   Time 8   Period Weeks   Status New     OT LONG TERM GOAL #7   Title Pt will demonstrate 25% finger flexion/ extension in prep for grasp release of a 1 inch block.   Time 4   Period Weeks   Status New     OT LONG TERM GOAL #8   Title Pt will demonstrate 30* of left shoulder flexion in prep for functional reach.   Time 8   Period Weeks   Status New               Plan - 12/29/16 1522    Clinical Impression Statement  Pt is progressing towards goals. She demonstrates improved ability to initate movement in LUE in a quiet environment with minimal distractions.   Rehab Potential Good   Clinical Impairments Affecting Rehab Potential severity of deficits; cognitive deficits   OT Frequency 2x / week   OT Duration 8 weeks   OT Treatment/Interventions Self-care/ADL training;Moist  Heat;Fluidtherapy;DME and/or AE instruction;Splinting;Patient/family education;Balance Financial planner;Contrast Bath;Ultrasound;Therapeutic exercise;Therapeutic activities;Cognitive remediation/compensation;Passive range of motion;Functional Mobility Training;Neuromuscular education;Cryotherapy;Electrical Stimulation;Parrafin;Energy conservation;Manual Therapy;Visual/perceptual remediation/compensation   Plan placing laundry to/ from washer, progress HEP as able   Consulted and Agree with Plan of Care Patient   Family Member Consulted husband      Patient will benefit from skilled therapeutic intervention in order to improve the following deficits and impairments:  Abnormal gait, Decreased coordination, Decreased range of motion, Difficulty walking, Impaired flexibility, Impaired sensation, Increased edema, Decreased safety awareness, Decreased endurance, Decreased activity tolerance, Decreased knowledge of precautions, Increased fascial restricitons, Impaired tone, Pain, Impaired UE functional use, Decreased knowledge of use of DME, Decreased balance, Decreased cognition, Decreased mobility, Decreased strength, Impaired perceived functional ability, Impaired vision/preception  Visit Diagnosis: Hemiplegia and hemiparesis following cerebral infarction affecting left dominant side (HCC)  Other abnormalities of gait and mobility  Other lack of coordination  Unsteadiness on feet  Acute pain of left shoulder    Problem List Patient Active Problem List   Diagnosis Date Noted  . Left flaccid hemiplegia 06/24/2016  . Ear itching   . Poor fluid intake   . Bradycardia, drug induced   . Poor nutrition   . Muscle spasticity   . Obesity (BMI 30.0-34.9) 04/08/2016  . Hyperlipidemia 04/08/2016  . Basal ganglia hemorrhage ([place]) 04/08/2016  . Aphasia as late effect of cerebrovascular accident   . Dysphagia as late effect of cerebrovascular disease   . Migraine with aura and without status  migrainosus, not intractable   . HLD (hyperlipidemia)   . Benign essential HTN   . Gait disturbance, post-stroke   . Hemiplegia, post-stroke ([place])   . Hypokalemia   . Dysphagia, post-stroke   . Aphasia, post-stroke   . Bradycardia   . Headache, migraine   . Dysarthria, post-stroke   . Encephalopathy acute 04/02/2016  . ICH (intracerebral hemorrhage) (HCC) - R basal ganglia due to hypertensive emergency 04/01/2016  . Upper airway cough syndrome 12/06/2015  . PCP NOTES >>> 09/25/2015  . Need for hepatitis B vaccination 10/19/2014  . Insomnia 12/28/2012  . Annual physical exam 11/23/2011  . Hemorrhoids 10/13/2011  . Morbid obesity ([place]) 02/26/2011  . CHEST PAIN UNSPECIFIED 03/28/2010  . Essential hypertension 10/19/2007  . Headache(784.0) 05/12/2007    RINE,KATHRYN 12/29/2016, 5:11 PM  [facility] [address] [place], [place], [address] Phone: [phone]   Fax:  [phone]  Name: Abigail Campos MRN: [id number] Date of Birth: May 05, 1971

## 2016-12-30 ENCOUNTER — Ambulatory Visit: Payer: Managed Care, Other (non HMO) | Admitting: Physical Therapy

## 2016-12-30 ENCOUNTER — Ambulatory Visit: Payer: Managed Care, Other (non HMO) | Admitting: Occupational Therapy

## 2016-12-31 ENCOUNTER — Ambulatory Visit: Payer: Managed Care, Other (non HMO) | Admitting: Occupational Therapy

## 2016-12-31 ENCOUNTER — Encounter: Payer: Managed Care, Other (non HMO) | Admitting: *Deleted

## 2016-12-31 NOTE — Therapy (Signed)
[facility] [address] [place], [place], [address] Phone: [phone]   Fax:  [phone]  Physical Therapy Treatment  Patient Details  Name: Abigail Campos MRN: [id number] Date of Birth: [birth year] Referring Provider: Dr. Posey Pronto  Encounter Date: 12/29/2016   12/29/16 2334  PT Visits / Re-Eval  Visit Number 27  Number of Visits 41  Date for PT Re-Evaluation 01/28/17  Authorization  Authorization Type Aetna-240 visit limit (including HH and OP). Per Lattie Haw, pt used 24 total HHPT, [place], and [place] visits.  PT Time Calculation  PT Start Time 1402  PT Stop Time 1443  PT Time Calculation (min) 41 min  PT - End of Session  Equipment Utilized During Treatment Gait belt  Activity Tolerance Patient tolerated treatment well  Behavior During Therapy WFL for tasks assessed/performed      Past Medical History:  Diagnosis Date  . Essential hypertension 10/19/2007   03-2010: metoprolol changed to bystolic (was not feeling well on it, no specific allergy or reaction)    . Hypertension   . Stroke [facility])     Past Surgical History:  Procedure Laterality Date  . ABDOMINAL HYSTERECTOMY  11-14-07   no oophorectomy per surgical report  . CESAREAN SECTION     [id number]  . INGUINAL HERNIA REPAIR     2004    Vitals:   12/29/16 1411  BP: 121/89  Pulse: 86     12/29/16 1411  Symptoms/Limitations  Subjective No new complaints. Sore after last session "a good kind of sore". Got a prescription for cane from MD. Also reporting right arm pain due to PNA shot.   Patient is accompained by: Family member  Pertinent History HTN, hyperlipidemia, hx of migraine  Patient Stated Goals I want to walk and stand up without falling.   Pain Assessment  Currently in Pain? Yes  Pain Score 6  Pain Location Arm  Pain Descriptors / Indicators Sore;Aching  Pain Type Acute pain  Pain Onset Yesterday  Pain Frequency Constant  Aggravating  Factors  lifting arm up, PNA shot  Pain Relieving Factors rest, ice/biofreeze      12/29/16 1420  Transfers  Transfers Sit to Stand;Stand to Sit  Sit to Stand 5: Supervision;With upper extremity assist;From bed;From chair/3-in-1  Stand to Sit 5: Supervision;With upper extremity assist;To bed;To chair/3-in-1  Ambulation/Gait  Ambulation/Gait Yes  Ambulation/Gait Assistance 5: Supervision  Ambulation/Gait Assistance Details cues for reciprocal step length and weight shifting to allow for this, cues on occasion for cane placement. gait distance limited by UE pain with weight bearing through cane.               Ambulation Distance (Feet) 90 Feet (x 2)  Assistive device Small based quad cane  Gait Pattern Step-through pattern;Decreased stance time - left;Decreased step length - right;Decreased hip/knee flexion - left;Left genu recurvatum;Left flexed knee in stance;Narrow base of support  Ambulation Surface Level;Indoor  Neuro Re-ed   Neuro Re-ed Details  with mirror feedback and tactile cues: sit<>stand with emphasis on maintaining midline position with equal LE weight bearing; mini squats x10 reps with emphasis on equal LE weight bearing in midline position.  min to mod assist with cues/facilitation for positioning and weight bearing.  PT Short Term Goals - 12/01/16 1450      PT SHORT TERM GOAL #1   Title Pt will be IND in performing progressed HEP to improve strength and balance. TARGET DATE FOR ALL STGS: 12/29/16   Status New     PT SHORT TERM GOAL #2   Title Pt will improve BERG score to >/=48/56 to decr. falls risk.    Status New     PT SHORT TERM GOAL #3   Title Pt will amb. 400' over even/uneven terrain with LRAD at MOD I level to improve functional mobility.    Status New     PT SHORT TERM GOAL #4   Title Pt will trial SciFit and treadmill and write goals if appropriate to reintegrate pt into community exercises.   Status New            PT Long Term Goals - 12/01/16 1452      PT LONG TERM GOAL #1   Title Pt will verablize plans to join fitness center to maintain gains made in PT. TARGET DATE FOR ALL LTGS: 01/26/17   Baseline ALL UNMET LTGS WILL BE CARRIED OVER TO NEW POC: 01/26/17   Status New     PT LONG TERM GOAL #2   Title Pt will improve gait speed to >/=1.9f/sec. with LRAD to reduce falls risk.    Baseline not met 12/01/16: 0.877fsec.   Status On-going     PT LONG TERM GOAL #3   Title Pt will amb. 400' over even/paved surfaces with LRAD at MOD I level to improve functional mobility.    Baseline partially met 12/01/16   Status On-going     PT LONG TERM GOAL #4   Title Pt will perform TUG with LRAD in </=13.5sec. to improve function and decr. falls risk.    Baseline Not met on 12/01/16: 27.3   Status On-going     PT LONG TERM GOAL #6   Title Pt will improve BERG score to >/=51/56 to decr. falls risk.    Baseline 44/56 on 12/01/16   Status On-going        12/29/16 2335  Plan  Clinical Impression Statement Today's session continued to focus on gait with quad cane and LLE weight bearing/strengthening. Pt limited today by pain in right UE due to recent shot causing pain/soreness. Pt is still making progress despite being limited by pain. Pt should benefit from continued PT to progress toward unnet goals.                             Pt will benefit from skilled therapeutic intervention in order to improve on the following deficits Decreased endurance;Decreased knowledge of use of DME;Decreased strength;Impaired UE functional use;Impaired sensation;Decreased balance;Decreased mobility;Decreased range of motion;Decreased coordination;Impaired flexibility;Decreased cognition;Abnormal gait  Rehab Potential Good  Clinical Impairments Affecting Rehab Potential co-morbidities  PT Frequency 2x / week  PT Duration 8 weeks (original POC 2x/week for 8 weeks)  PT Treatment/Interventions ADLs/Self Care Home  Management;Biofeedback;Canalith Repostioning;Electrical Stimulation;Cognitive remediation;Neuromuscular re-education;Balance training;Therapeutic exercise;Therapeutic activities;Functional mobility training;Stair training;Gait training;DME Instruction;Orthotic Fit/Training;Patient/family education;Vestibular;Manual techniques  PT Next Visit Plan Continue gait training with SBQC, strengthening LLE and balance training. Treadmill training.  PT Home Exercise Plan Strengthening HEP.   Consulted and Agree with Plan of Care Patient;Family member/caregiver  Family Member Consulted pt's mom: [name]   Patient will benefit from skilled therapeutic intervention in order to improve the following deficits and impairments:  Decreased endurance, Decreased knowledge of use of DME, Decreased strength, Impaired UE functional use, Impaired sensation, Decreased balance, Decreased mobility, Decreased range of motion, Decreased coordination, Impaired flexibility, Decreased cognition, Abnormal gait  Visit Diagnosis: Hemiplegia and hemiparesis following cerebral infarction affecting left dominant side (HCC)  Other abnormalities of gait and mobility  Other lack of coordination  Unsteadiness on feet     Problem List Patient Active Problem List   Diagnosis Date Noted  . Prediabetes 12/29/2016  . Depression 12/29/2016  . Bradycardia, drug induced   . Muscle spasticity   . Obesity (BMI 30.0-34.9) 04/08/2016  . Hyperlipidemia 04/08/2016  . Basal ganglia hemorrhage ([place]) 04/08/2016  . Dysphagia as late effect of cerebrovascular disease   . Migraine with aura and without status migrainosus, not intractable   . Benign essential HTN   . Gait disturbance, post-stroke   . Hemiplegia, post-stroke ([place])   . Dysphagia, post-stroke   . Aphasia, post-stroke   . Bradycardia   . Dysarthria, post-stroke   . ICH (intracerebral hemorrhage) (HCC) - R basal ganglia due to hypertensive emergency 04/01/2016  . Upper airway  cough syndrome 12/06/2015  . PCP NOTES >>> 09/25/2015  . Insomnia 12/28/2012  . Annual physical exam 11/23/2011  . Hemorrhoids 10/13/2011  . Morbid obesity ([place]) 02/26/2011  . CHEST PAIN UNSPECIFIED 03/28/2010  . Essential hypertension 10/19/2007    Willow Ora, PTA, [facility] [address], [place], [place] [address] [phone] 12/31/16, 11:51 PM   Name: Abigail Campos MRN: [id number] Date of Birth: 04/06/1971

## 2017-01-01 ENCOUNTER — Encounter: Payer: Self-pay | Admitting: Physical Therapy

## 2017-01-01 ENCOUNTER — Ambulatory Visit: Payer: Managed Care, Other (non HMO) | Admitting: Physical Therapy

## 2017-01-01 ENCOUNTER — Ambulatory Visit: Payer: Managed Care, Other (non HMO)

## 2017-01-01 VITALS — BP 119/77 | HR 61

## 2017-01-01 DIAGNOSIS — I69015 Cognitive social or emotional deficit following nontraumatic subarachnoid hemorrhage: Secondary | ICD-10-CM

## 2017-01-01 DIAGNOSIS — R2681 Unsteadiness on feet: Secondary | ICD-10-CM

## 2017-01-01 DIAGNOSIS — R471 Dysarthria and anarthria: Secondary | ICD-10-CM

## 2017-01-01 DIAGNOSIS — I69352 Hemiplegia and hemiparesis following cerebral infarction affecting left dominant side: Secondary | ICD-10-CM | POA: Diagnosis not present

## 2017-01-01 DIAGNOSIS — R278 Other lack of coordination: Secondary | ICD-10-CM

## 2017-01-01 DIAGNOSIS — R2689 Other abnormalities of gait and mobility: Secondary | ICD-10-CM

## 2017-01-01 NOTE — Therapy (Signed)
[facility] [address] [place], [place], [address] Phone: [phone]   Fax:  [phone]  Physical Therapy Treatment  Patient Details  Name: Abigail Campos MRN: [id number] Date of Birth: [birth year] Referring Provider: Dr. Allena Katz  Encounter Date: 01/01/2017      PT End of Session - 01/01/17 1409    Visit Number 28   Number of Visits 41   Date for PT Re-Evaluation 01/28/17   Authorization Type Aetna-240 visit limit (including HH and OP). Per Misty Stanley, pt used 24 total HHPT, HHOT, and HHSpeech visits.   PT Start Time 1403   PT Stop Time 1450   PT Time Calculation (min) 47 min   Equipment Utilized During Treatment Gait belt   Activity Tolerance Patient tolerated treatment well   Behavior During Therapy WFL for tasks assessed/performed      Past Medical History:  Diagnosis Date  . Essential hypertension 10/19/2007   03-2010: metoprolol changed to bystolic (was not feeling well on it, no specific allergy or reaction)    . Hypertension   . Stroke [facility])     Past Surgical History:  Procedure Laterality Date  . ABDOMINAL HYSTERECTOMY  11-14-07   no oophorectomy per surgical report  . CESAREAN SECTION     [id number]  . INGUINAL HERNIA REPAIR     2004    Vitals:   01/01/17 1409  BP: 119/77  Pulse: 61        Subjective Assessment - 01/01/17 1409    Subjective Right arm better today. No falls. No pain to report today. Was sick past couple days with fever, it broke yesterday.    Patient is accompained by: Family member  daughter   Pertinent History HTN, hyperlipidemia, hx of migraine   Patient Stated Goals I want to walk and stand up without falling.    Currently in Pain? No/denies   Pain Score 0-No pain            [facility] Adult PT Treatment/Exercise - 01/01/17 1414      Transfers   Transfers Sit to Stand;Stand to Sit   Sit to Stand 5: Supervision;With upper extremity assist;From bed;From chair/3-in-1    Sit to Stand Details (indicate cue type and reason) continues to favor right LE with standing   Stand to Sit 5: Supervision;With upper extremity assist;To bed;To chair/3-in-1   Stand to Sit Details cues to fully align with surface prior to sitting down for safety     Ambulation/Gait   Ambulation/Gait Yes   Ambulation/Gait Assistance 5: Supervision;4: Min guard   Ambulation/Gait Assistance Details increased assistance (min guard) as distance increased due to fatigue, no loss of balance noted. cues on posture, step length and weight shifting with gait.                                         Ambulation Distance (Feet) 420 Feet   Assistive device Small based quad cane   Gait Pattern Step-through pattern;Decreased stance time - left;Decreased step length - right;Decreased hip/knee flexion - left;Left genu recurvatum;Left flexed knee in stance;Narrow base of support   Ambulation Surface Level;Indoor     Berg Balance Test   Sit to Stand Able to stand without using hands and stabilize independently   Standing Unsupported Able to stand safely 2 minutes   Sitting with Back Unsupported but Feet Supported on Floor or Stool Able  to sit safely and securely 2 minutes   Stand to Sit Sits safely with minimal use of hands   Transfers Able to transfer safely, minor use of hands   Standing Unsupported with Eyes Closed Able to stand 10 seconds with supervision   Standing Ubsupported with Feet Together Able to place feet together independently and stand 1 minute safely   From Standing, Reach Forward with Outstretched Arm Can reach forward >12 cm safely (5")  8 inches   From Standing Position, Pick up Object from Floor Able to pick up shoe safely and easily   From Standing Position, Turn to Look Behind Over each Shoulder Looks behind from both sides and weight shifts well   Turn 360 Degrees Able to turn 360 degrees safely but slowly  > 10 sec's each way   Standing Unsupported, Alternately Place Feet on  Step/Stool Able to complete >2 steps/needs minimal assist   Standing Unsupported, One Foot in Front Able to plae foot ahead of the other independently and hold 30 seconds   Standing on One Leg Able to lift leg independently and hold 5-10 seconds  right leg stance only   Total Score 47             PT Short Term Goals - 01/01/17 1410      PT SHORT TERM GOAL #1   Title Pt will be IND in performing progressed HEP to improve strength and balance. TARGET DATE FOR ALL STGS: 12/29/16   Baseline 01/01/17: met with current HEP   Status Achieved     PT SHORT TERM GOAL #2   Title Pt will improve BERG score to >/=48/56 to decr. falls risk.    Baseline 12/31/16: increased to 47/56 (from 45/56), not to goal   Status Partially Met     PT SHORT TERM GOAL #3   Title Pt will amb. 400' over even/uneven terrain with LRAD at MOD I level to improve functional mobility.    Baseline 12/31/16: has met the distance with small based quad cane on level surfaces at supervision   Status Partially Met     PT SHORT TERM GOAL #4   Title Pt will trial SciFit and treadmill and write goals if appropriate to reintegrate pt into community exercises.   Status On-going           PT Long Term Goals - 12/01/16 1452      PT LONG TERM GOAL #1   Title Pt will verablize plans to join fitness center to maintain gains made in PT. TARGET DATE FOR ALL LTGS: 01/26/17   Baseline ALL UNMET LTGS WILL BE CARRIED OVER TO NEW POC: 01/26/17   Status New     PT LONG TERM GOAL #2   Title Pt will improve gait speed to >/=1.61ft/sec. with LRAD to reduce falls risk.    Baseline not met 12/01/16: 0.31ft/sec.   Status On-going     PT LONG TERM GOAL #3   Title Pt will amb. 400' over even/paved surfaces with LRAD at MOD I level to improve functional mobility.    Baseline partially met 12/01/16   Status On-going     PT LONG TERM GOAL #4   Title Pt will perform TUG with LRAD in </=13.5sec. to improve function and decr. falls risk.     Baseline Not met on 12/01/16: 27.3   Status On-going     PT LONG TERM GOAL #6   Title Pt will improve BERG score to >/=51/56 to decr. falls  risk.    Baseline 44/56 on 12/01/16   Status On-going            Plan - 01/01/17 1409    Clinical Impression Statement Pt has partially met 2/4 STGs, met 1 and the remaining goal for treadmill/scifit for community fitness needs to be addressed (will plan for next session). Pt is progressing toward LTGs as well and should benefit from continued PT to progress toward unmet goals.    Rehab Potential Good   Clinical Impairments Affecting Rehab Potential co-morbidities   PT Frequency 2x / week   PT Duration 8 weeks  original POC 2x/week for 8 weeks   PT Treatment/Interventions ADLs/Self Care Home Management;Biofeedback;Canalith Repostioning;Electrical Stimulation;Cognitive remediation;Neuromuscular re-education;Balance training;Therapeutic exercise;Therapeutic activities;Functional mobility training;Stair training;Gait training;DME Instruction;Orthotic Fit/Training;Patient/family education;Vestibular;Manual techniques   PT Next Visit Plan Continue gait training with SBQC, strengthening LLE and balance training. Treadmill training.   PT Home Exercise Plan Strengthening HEP.    Consulted and Agree with Plan of Care Patient;Family member/caregiver   Family Member Consulted pt's mom: [name]      Patient will benefit from skilled therapeutic intervention in order to improve the following deficits and impairments:  Decreased endurance, Decreased knowledge of use of DME, Decreased strength, Impaired UE functional use, Impaired sensation, Decreased balance, Decreased mobility, Decreased range of motion, Decreased coordination, Impaired flexibility, Decreased cognition, Abnormal gait  Visit Diagnosis: Hemiplegia and hemiparesis following cerebral infarction affecting left dominant side (HCC)  Other abnormalities of gait and mobility  Unsteadiness on  feet  Other lack of coordination     Problem List Patient Active Problem List   Diagnosis Date Noted  . Prediabetes 12/29/2016  . Depression 12/29/2016  . Bradycardia, drug induced   . Muscle spasticity   . Obesity (BMI 30.0-34.9) 04/08/2016  . Hyperlipidemia 04/08/2016  . Basal ganglia hemorrhage ([place]) 04/08/2016  . Dysphagia as late effect of cerebrovascular disease   . Migraine with aura and without status migrainosus, not intractable   . Benign essential HTN   . Gait disturbance, post-stroke   . Hemiplegia, post-stroke ([place])   . Dysphagia, post-stroke   . Aphasia, post-stroke   . Bradycardia   . Dysarthria, post-stroke   . ICH (intracerebral hemorrhage) (HCC) - R basal ganglia due to hypertensive emergency 04/01/2016  . Upper airway cough syndrome 12/06/2015  . PCP NOTES >>> 09/25/2015  . Insomnia 12/28/2012  . Annual physical exam 11/23/2011  . Hemorrhoids 10/13/2011  . Morbid obesity ([place]) 02/26/2011  . CHEST PAIN UNSPECIFIED 03/28/2010  . Essential hypertension 10/19/2007    Willow Ora, PTA, [facility] [address], [place], [place] [address] [phone] 01/01/17, 3:04 PM   Name: Abigail Campos MRN: [id number] Date of Birth: 1971/03/08

## 2017-01-01 NOTE — Therapy (Signed)
[facility] [address] [place], [place], [address] Phone: [phone]   Fax:  [phone]  Speech Language Pathology Treatment  Patient Details  Name: Abigail Campos MRN: [id number] Date of Campos: [birth year] Referring Provider: Delice Lesch, M.D.  Encounter Date: 01/01/2017      End of Session - 01/01/17 1649    Visit Number 21   Number of Visits 29   Date for SLP Re-Evaluation 03/05/17   SLP Start Time 1321  pt in restroom at 19   SLP Stop Time  1404   SLP Time Calculation (min) 43 min   Activity Tolerance Patient tolerated treatment well      Past Medical History:  Diagnosis Date  . Essential hypertension 10/19/2007   03-2010: metoprolol changed to bystolic (was not feeling well on it, no specific allergy or reaction)    . Hypertension   . Stroke [facility])     Past Surgical History:  Procedure Laterality Date  . ABDOMINAL HYSTERECTOMY  11-14-07   no oophorectomy per surgical report  . CESAREAN SECTION     [id number]  . INGUINAL HERNIA REPAIR     2004    There were no vitals filed for this visit.      Subjective Assessment - 01/01/17 1338    Subjective "I haven't seen you in 4 weeks."               ADULT SLP TREATMENT - 01/01/17 1341      General Information   Behavior/Cognition Alert;Cooperative;Pleasant mood     Treatment Provided   Treatment provided Cognitive-Linquistic     Cognitive-Linquistic Treatment   Treatment focused on Cognition   Skilled Treatment Pt was evaluated using the Self-administered Gerocognitive Evaluation (SAGE) in order to obtain objective assessment of cognitive linguistics after being seen less than prescribed frequency for approx 8 weeks. She demonstrated decr'd attention to detail, decr'd emergent awareness, decr'd problem solving, which is all hindered by pt impulsivity. Pt did not correct any errors, but demo'd emergent awareness with a simple reasoning  task. SLP talked with pt's daughter about pt's deficits.      Assessment / Recommendations / Plan   Plan Continue with current plan of care;Goals updated     Progression Toward Goals   Progression toward goals --  goals updated; little change seen in pt performance          SLP Education - 01/01/17 1649    Education provided Yes   Education Details deficit areas   Person(s) Educated Patient   Methods Explanation;Demonstration   Comprehension Verbalized understanding          SLP Short Term Goals - 01/01/17 1652      SLP SHORT TERM GOAL #1   Title pt will name 3 deficit areas independentlly, in two sessions   Baseline goal renewed/modified week of 10-26-16   Time 1   Period Weeks   Status On-going     SLP SHORT TERM GOAL #2   Title pt will demo selective attention to therapy tasks for 10 minutes in min-mod noisy environment   Status Achieved     SLP SHORT TERM GOAL #3   Title pt will complete tasks of simple cognitive linguistic organization with 90% success and rare min A   Status Achieved     SLP SHORT TERM GOAL #4   Title pt will attempt to self correct misarticulated speech in 50% of opportunities in structured speech tasks   Status Deferred  Pt is functional. Focus on cognitive linguistics          SLP Long Term Goals - 01/01/17 1653      SLP LONG TERM GOAL #1   Title pt will demo alternating attention in simple cognitive linguistic tasks with appropriate switch times, with compensations   Status Achieved     SLP LONG TERM GOAL #2   Title pt will demo emergent awareness in cognitive linguistic tasks by self correcting at least 75% of errors with nonverbal cues   Baseline renewed "ongoing" long term goals 01-01-17, for 4 weeks or 8 visits   Time 4   Period Weeks   Status On-going  and revised     SLP LONG TERM GOAL #3   Title pt will demo functional organization in simple to mod complex linguistic tasks with occasional verbal cues   Baseline renewed  01-01-17   Time 4   Period Weeks   Status On-going  and revised     SLP LONG TERM GOAL #4   Title pt will demo rate of speech in 5 minutes appropriate for 95-99% intelligibility in simple to mod complex conversation with occasional verbal cues   Status Deferred  (01-01-17) pt's speech is functional - focus on cognitive linguistics     SLP LONG TERM GOAL #5   Title pt will demo knowledge of fast speech rate decreasing intelligibility when in conversation and will self correct 90% of the time   Status On-going  (01-01-17) pt's speech is functional - focus on cognitive linguistics     SLP LONG TERM GOAL #6   Title pt will demo problem solving skills/organization skills adequate to achieve 85% success in simple/mod complex cognitive linquistic tasks   Time 4   Period Weeks   Status New          Plan - 01/01/17 1650    Clinical Impression Statement Pt continues to require SLP A for organizing/problem solving, reasoning, error awareness and attention to detail as exampled on the SAGE. Cognitive testing required extended time than WNL today. Goals will be Continue skilld ST to maximize cognition and intelligibility for improved indepedence.   Speech Therapy Frequency 2x / week   Duration 4 weeks   or 8 more sessions (29 total sessions)   Treatment/Interventions Compensatory strategies;Patient/family education;Functional tasks;Cueing hierarchy;Cognitive reorganization;Internal/external aids;SLP instruction and feedback;Oral motor exercises   Potential to Achieve Goals Good   Potential Considerations Severity of impairments   Consulted and Agree with Plan of Care Patient      Patient will benefit from skilled therapeutic intervention in order to improve the following deficits and impairments:   Cognitive communication deficit - Plan: SLP plan of care cert/re-cert  Dysarthria and anarthria - Plan: SLP plan of care cert/re-cert    Problem List Patient Active Problem List   Diagnosis  Date Noted  . Prediabetes 12/29/2016  . Depression 12/29/2016  . Bradycardia, drug induced   . Muscle spasticity   . Obesity (BMI 30.0-34.9) 04/08/2016  . Hyperlipidemia 04/08/2016  . Basal ganglia hemorrhage ([place]) 04/08/2016  . Dysphagia as late effect of cerebrovascular disease   . Migraine with aura and without status migrainosus, not intractable   . Benign essential HTN   . Gait disturbance, post-stroke   . Hemiplegia, post-stroke ([place])   . Dysphagia, post-stroke   . Aphasia, post-stroke   . Bradycardia   . Dysarthria, post-stroke   . ICH (intracerebral hemorrhage) (HCC) - R basal ganglia due to hypertensive emergency 04/01/2016  .  Upper airway cough syndrome 12/06/2015  . PCP NOTES >>> 09/25/2015  . Insomnia 12/28/2012  . Annual physical exam 11/23/2011  . Hemorrhoids 10/13/2011  . Morbid obesity ([place]) 02/26/2011  . CHEST PAIN UNSPECIFIED 03/28/2010  . Essential hypertension 10/19/2007    [facility] ,[place], [place]  01/01/2017, 5:04 PM  [facility] [address] [place], [place], [address] Phone: [phone]   Fax:  [phone]   Name: Abigail Campos MRN: [id number] Date of Campos: 06-Jun-1971

## 2017-01-04 ENCOUNTER — Ambulatory Visit: Payer: Managed Care, Other (non HMO) | Admitting: Occupational Therapy

## 2017-01-04 ENCOUNTER — Ambulatory Visit: Payer: Managed Care, Other (non HMO) | Admitting: Physical Therapy

## 2017-01-04 ENCOUNTER — Encounter: Payer: Self-pay | Admitting: Physical Therapy

## 2017-01-04 DIAGNOSIS — R2681 Unsteadiness on feet: Secondary | ICD-10-CM

## 2017-01-04 DIAGNOSIS — R2689 Other abnormalities of gait and mobility: Secondary | ICD-10-CM

## 2017-01-04 DIAGNOSIS — I69352 Hemiplegia and hemiparesis following cerebral infarction affecting left dominant side: Secondary | ICD-10-CM | POA: Diagnosis not present

## 2017-01-04 DIAGNOSIS — R278 Other lack of coordination: Secondary | ICD-10-CM

## 2017-01-04 DIAGNOSIS — I69115 Cognitive social or emotional deficit following nontraumatic intracerebral hemorrhage: Secondary | ICD-10-CM

## 2017-01-04 NOTE — Therapy (Signed)
[facility] [address] [place], [place], [address] Phone: [phone]   Fax:  [phone]  Occupational Therapy Treatment  Patient Details  Name: Abigail Campos MRN: [id number] Date of Birth: 05-17-1971 Referring Provider: Dr. Posey Pronto  Encounter Date: 01/04/2017      OT End of Session - 01/04/17 1637    Visit Number 26   Number of Visits 35   Date for OT Re-Evaluation 01/31/16   Authorization Type [employer] (240 visit limit combined with home health, estimated HH used 24 combined), no auth required    Authorization Time Period week 6/8- renewed 12/01/16   OT Start Time 1408   OT Stop Time 1447   OT Time Calculation (min) 39 min   Activity Tolerance Patient tolerated treatment well   Behavior During Therapy [facility] for tasks assessed/performed      Past Medical History:  Diagnosis Date  . Essential hypertension 10/19/2007   03-2010: metoprolol changed to bystolic (was not feeling well on it, no specific allergy or reaction)    . Hypertension   . Stroke [facility])     Past Surgical History:  Procedure Laterality Date  . ABDOMINAL HYSTERECTOMY  11-14-07   no oophorectomy per surgical report  . CESAREAN SECTION     [id number]  . INGUINAL HERNIA REPAIR     2004    There were no vitals filed for this visit.      Subjective Assessment - 01/04/17 1636    Subjective  Pt reports that she has been trying to work on her attention/focus   Pertinent History see Epic   Patient Stated Goals to be able to use left arm   Currently in Pain? No/denies         In standing, placing laundry into washer (from raised surface).  Then from washer to dryer and from dryer to raised surface.  Then folded items.  Pt able to perform with supervision for balance and min cueing to incr wt. Shift to the L for improved midline alignment and neuro re-ed to L side.  Pt fatigued quickly and needed rest break prior to folding items.  In  standing, reaching overhead and across body with RUE to incr wt. Bearing on L side (LUE supported on raised surface to encourage wt. Shift).    In busy environment, AAROM low range shoulder flex/elbow ext on UE ranger with min facilitation, then progressed to encourage horizontal abduction and ER to assist in breaking out of flexor synergy pattern.  Then wt. Bearing through LUE (hand on mat in abduction) with body on arm movements with min facilitation/cues for alignment and incr LUE activation.  Elbow flex/ext in sitting while holding cylinder object with min facilitation/AAROM.  Functional movement to place hand in lap with min cueing to use normal movement patterns and min facilitation.     Actively relaxing fingers to assist in releasing cylinder object with min-mod facilitation.                  Balance Exercises - 01/04/17 1407      Balance Exercises: Standing   Other Standing Exercises Worked on balance, LLE strength and weight shifiting, in tall knee with bench for intermittent UE support; also performed mini squats emphasizing increased weight bearing on the left and full L hip extension.  OT Short Term Goals - 12/29/16 1521      OT SHORT TERM GOAL #1   Title Pt/ caregiver will be I with HEP.   Time 6   Period Weeks   Status Achieved     OT SHORT TERM GOAL #2   Title Pt/ family will verbalize understanding of LUE positioning to minimize pain and risk for injury, including splinting PRN.   Time 6   Period Weeks   Status Achieved  10/01/16, 10/26/16     OT SHORT TERM GOAL #3   Title Pt will donn shirt with min A.   Time 6   Period Weeks   Status Achieved  10/01/16  mod I     OT SHORT TERM GOAL #4   Title Pt will demonstrate 90* P/ROM shoulder flexion in LUE with pain less than or equal to 3/10.   Time 6   Period Weeks   Status Achieved  10/06/16 met to 90* with no pain     OT SHORT TERM GOAL #5    Title Pt will perform simple snack prep modified independently.   Time 6   Period Weeks   Status Achieved    12/01/16 made coffee.  12/16/15:  met per pt report     OT SHORT TERM GOAL #6   Title Pt will perform bathing at a supervision level   Time 6   Period Weeks   Status Achieved  10/01/16 met per pt report     OT SHORT TERM GOAL #7   Title Pt will perfom snack prep and light home management with min A .due 01/01/16   Baseline has only made coffee, folded towels   Time 4   Period Weeks   Status Achieved  12/25/15:  made breakfast/lunch, laundry     OT SHORT TERM GOAL #8   Title Pt will use LUE as a stabilizer/ gross assist for ADLS 24% of the time.   Time 4   Period Weeks   Status On-going  25%     OT SHORT TERM GOAL  #9   TITLE I with updated HEP.   Time 4   Period Weeks   Status On-going           OT Long Term Goals - 01/04/17 1640      OT LONG TERM GOAL #1   Title Pt will perform LB dressing with min A.   Time 12   Period Weeks   Status Achieved  10/01/16:  needs A for L shoe/brace     OT LONG TERM GOAL #2   Title Pt will use LUE as a stabilizer/ gross A with min A.   Time 12   Period Weeks   Status Achieved  uses for bathing, ,uses grossly 25 % of the time     OT LONG TERM GOAL #3   Title Pt will perform simple home management tasks with min A.- now  a short term goal   Time 12   Period Weeks   Status Achieved  not consistently performing has perfomed folding laundry.  01/04/17  met     OT LONG TERM GOAL #4   Title Pt will demonstrate adequate standing balance to retrieve a light weight object from overhead cabinet modified independently with RUE without LOB in prep for IADLS.   Time 12   Period Weeks   Status Achieved     OT LONG TERM GOAL #5   Title Pt will perform shower transfers modified  independently   Time 12   Period Weeks   Status Achieved     OT LONG TERM GOAL #6   Title Pt will use LUE as a gross assist 50 % of the time for ADLs/  functional activity.   Time 8   Period Weeks   Status New     OT LONG TERM GOAL #7   Title Pt will demonstrate 25% finger flexion/ extension in prep for grasp release of a 1 inch block.   Time 4   Period Weeks   Status New     OT LONG TERM GOAL #8   Title Pt will demonstrate 30* of left shoulder flexion in prep for functional reach.   Time 8   Period Weeks   Status New               Plan - 01/04/17 1638    Clinical Impression Statement Pt is slowly progressing towards goals.  She demo improved ability to wt. shift to the L and improved midline alignment with cueing in functional tasks.   Rehab Potential Good   Clinical Impairments Affecting Rehab Potential severity of deficits; cognitive deficits   OT Frequency 2x / week   OT Duration 8 weeks   OT Treatment/Interventions Self-care/ADL training;Moist Heat;Fluidtherapy;DME and/or AE instruction;Splinting;Patient/family education;Balance Financial planner;Contrast Bath;Ultrasound;Therapeutic exercise;Therapeutic activities;Cognitive remediation/compensation;Passive range of motion;Functional Mobility Training;Neuromuscular education;Cryotherapy;Electrical Stimulation;Parrafin;Energy conservation;Manual Therapy;Visual/perceptual remediation/compensation   Plan progress HEP as able, continue with neuro re-ed   OT Home Exercise Plan 09/07/16:  resting splint wear/care; HEP   Consulted and Agree with Plan of Care Patient      Patient will benefit from skilled therapeutic intervention in order to improve the following deficits and impairments:  Abnormal gait, Decreased coordination, Decreased range of motion, Difficulty walking, Impaired flexibility, Impaired sensation, Increased edema, Decreased safety awareness, Decreased endurance, Decreased activity tolerance, Decreased knowledge of precautions, Increased fascial restricitons, Impaired tone, Pain, Impaired UE functional use, Decreased knowledge of use of DME, Decreased balance,  Decreased cognition, Decreased mobility, Decreased strength, Impaired perceived functional ability, Impaired vision/preception  Visit Diagnosis: Hemiplegia and hemiparesis following cerebral infarction affecting left dominant side (HCC)  Other abnormalities of gait and mobility  Unsteadiness on feet  Other lack of coordination  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 Prediabetes 12/29/2016  . Depression 12/29/2016  . Bradycardia, drug induced   . Muscle spasticity   . Obesity (BMI 30.0-34.9) 04/08/2016  . Hyperlipidemia 04/08/2016  . Basal ganglia hemorrhage ([place]) 04/08/2016  . Dysphagia as late effect of cerebrovascular disease   . Migraine with aura and without status migrainosus, not intractable   . Benign essential HTN   . Gait disturbance, post-stroke   . Hemiplegia, post-stroke ([place])   . Dysphagia, post-stroke   . Aphasia, post-stroke   . Bradycardia   . Dysarthria, post-stroke   . ICH (intracerebral hemorrhage) (HCC) - R basal ganglia due to hypertensive emergency 04/01/2016  . Upper airway cough syndrome 12/06/2015  . PCP NOTES >>> 09/25/2015  . Insomnia 12/28/2012  . Annual physical exam 11/23/2011  . Hemorrhoids 10/13/2011  . Morbid obesity ([place]) 02/26/2011  . CHEST PAIN UNSPECIFIED 03/28/2010  . Essential hypertension 10/19/2007    [facility] 01/04/2017, 4:49 PM  [facility] [address] [place], [place], [address] Phone: [phone]   Fax:  [phone]  Name: Abigail Campos MRN: [id number] Date of  Birth: 22-Jul-1971   Vianne Bulls, OTR/L [facility] [address] [place], [place]  [address] [phone] phone [phone] 01/04/17 4:49 PM

## 2017-01-04 NOTE — Therapy (Signed)
[facility] [address] [place], [place], [address] Phone: [phone]   Fax:  [phone]  Physical Therapy Treatment  Patient Details  Name: Abigail Campos MRN: [id number] Date of Birth: 02/23/1971 Referring Provider: Dr. Posey Pronto  Encounter Date: 01/04/2017      PT End of Session - 01/04/17 1412    Visit Number 29   Number of Visits 61   Date for PT Re-Evaluation 01/28/17   Authorization Type [employer]-240 visit limit (including HH and OP). Per Lattie Haw, pt used 24 total HHPT, [place], and [place] visits.   PT Start Time 1317   PT Stop Time 1400   PT Time Calculation (min) 43 min   Activity Tolerance Patient tolerated treatment well      Past Medical History:  Diagnosis Date  . Essential hypertension 10/19/2007   03-2010: metoprolol changed to bystolic (was not feeling well on it, no specific allergy or reaction)    . Hypertension   . Stroke [facility])     Past Surgical History:  Procedure Laterality Date  . ABDOMINAL HYSTERECTOMY  11-14-07   no oophorectomy per surgical report  . CESAREAN SECTION     [id number]  . INGUINAL HERNIA REPAIR     2004    There were no vitals filed for this visit.      Subjective Assessment - 01/04/17 1320    Subjective Pt continues to walk at home.   Patient is accompained by: Family member  daughter   Pertinent History HTN, hyperlipidemia, hx of migraine   Patient Stated Goals I want to walk and stand up without falling.    Currently in Pain? No/denies                         [facility] Adult PT Treatment/Exercise - 01/04/17 0001      Ambulation/Gait   Ambulation/Gait Yes   Ambulation/Gait Assistance 4: Min guard;5: Supervision   Ambulation/Gait Assistance Details Working on visual scanning tasks and increase R step length.   Ambulation Distance (Feet) 115 Feet  80   Assistive device Small based quad cane   Gait Pattern Step-through pattern;Decreased stance time  - left;Decreased step length - right;Decreased hip/knee flexion - left;Left genu recurvatum;Left flexed knee in stance;Narrow base of support   Ambulation Surface Level;Indoor     Knee/Hip Exercises: Aerobic   Nustep Sci fit, Level 3, all extremities except L UE, facilitation for LLE control, 10 min.             Balance Exercises - 01/04/17 1407      Balance Exercises: Standing   Other Standing Exercises Worked on balance, LLE strength and weight shifiting, in tall knee with bench for intermittent UE support; also performed mini squats emphasizing increased weight bearing on the left and full L hip extension.                                           PT Education - 01/04/17 1411    Education provided Yes   Education Details Options and benefits of aerobic activity.   Person(s) Educated Patient   Methods Explanation   Comprehension Verbalized understanding;Returned demonstration          PT Short Term Goals - 01/01/17 1410      PT SHORT TERM GOAL #1   Title Pt will be IND in  performing progressed HEP to improve strength and balance. TARGET DATE FOR ALL STGS: 12/29/16   Baseline 01/01/17: met with current HEP   Status Achieved     PT SHORT TERM GOAL #2   Title Pt will improve BERG score to >/=48/56 to decr. falls risk.    Baseline 12/31/16: increased to 47/56 (from 45/56), not to goal   Status Partially Met     PT SHORT TERM GOAL #3   Title Pt will amb. 400' over even/uneven terrain with LRAD at MOD I level to improve functional mobility.    Baseline 12/31/16: has met the distance with small based quad cane on level surfaces at supervision   Status Partially Met     PT SHORT TERM GOAL #4   Title Pt will trial SciFit and treadmill and write goals if appropriate to reintegrate pt into community exercises.   Status On-going           PT Long Term Goals - 12/01/16 1452      PT LONG TERM GOAL #1   Title Pt will verablize plans to join fitness center to maintain  gains made in PT. TARGET DATE FOR ALL LTGS: 01/26/17   Baseline ALL UNMET LTGS WILL BE CARRIED OVER TO NEW POC: 01/26/17   Status New     PT LONG TERM GOAL #2   Title Pt will improve gait speed to >/=1.79f/sec. with LRAD to reduce falls risk.    Baseline not met 12/01/16: 0.837fsec.   Status On-going     PT LONG TERM GOAL #3   Title Pt will amb. 400' over even/paved surfaces with LRAD at MOD I level to improve functional mobility.    Baseline partially met 12/01/16   Status On-going     PT LONG TERM GOAL #4   Title Pt will perform TUG with LRAD in </=13.5sec. to improve function and decr. falls risk.    Baseline Not met on 12/01/16: 27.3   Status On-going     PT LONG TERM GOAL #6   Title Pt will improve BERG score to >/=51/56 to decr. falls risk.    Baseline 44/56 on 12/01/16   Status On-going               Plan - 01/04/17 1413    Clinical Impression Statement Pt was challlenged with visual scanning task during gait, requiring min guard.  Pt verbalized understanding of the importance of an aerobic fitness plan and was challenged by Sci fit stepper. Pt required min guard to min A with balance/strength activities in tall kneel.   Rehab Potential Good   Clinical Impairments Affecting Rehab Potential co-morbidities   PT Frequency 2x / week   PT Duration 8 weeks  original POC 2x/week for 8 weeks   PT Treatment/Interventions ADLs/Self Care Home Management;Biofeedback;Canalith Repostioning;Electrical Stimulation;Cognitive remediation;Neuromuscular re-education;Balance training;Therapeutic exercise;Therapeutic activities;Functional mobility training;Stair training;Gait training;DME Instruction;Orthotic Fit/Training;Patient/family education;Vestibular;Manual techniques   PT Next Visit Plan Give information on Stroke support Group. Continue gait training with SBQC, strengthening LLE and balance training (tall kneel). Treadmill training.     PT Home Exercise Plan Strengthening HEP.     Consulted and Agree with Plan of Care Patient;Family member/caregiver   Family Member Consulted pt's mom: Abigail Campos    Patient will benefit from skilled therapeutic intervention in order to improve the following deficits and impairments:  Decreased endurance, Decreased knowledge of use of DME, Decreased strength, Impaired UE functional use, Impaired sensation, Decreased balance, Decreased mobility, Decreased range of motion,  Decreased coordination, Impaired flexibility, Decreased cognition, Abnormal gait  Visit Diagnosis: Hemiplegia and hemiparesis following cerebral infarction affecting left dominant side (HCC)  Other abnormalities of gait and mobility  Unsteadiness on feet  Other lack of coordination     Problem List Patient Active Problem List   Diagnosis Date Noted  . Prediabetes 12/29/2016  . Depression 12/29/2016  . Bradycardia, drug induced   . Muscle spasticity   . Obesity (BMI 30.0-34.9) 04/08/2016  . Hyperlipidemia 04/08/2016  . Basal ganglia hemorrhage ([place]) 04/08/2016  . Dysphagia as late effect of cerebrovascular disease   . Migraine with aura and without status migrainosus, not intractable   . Benign essential HTN   . Gait disturbance, post-stroke   . Hemiplegia, post-stroke ([place])   . Dysphagia, post-stroke   . Aphasia, post-stroke   . Bradycardia   . Dysarthria, post-stroke   . ICH (intracerebral hemorrhage) (HCC) - R basal ganglia due to hypertensive emergency 04/01/2016  . Upper airway cough syndrome 12/06/2015  . PCP NOTES >>> 09/25/2015  . Insomnia 12/28/2012  . Annual physical exam 11/23/2011  . Hemorrhoids 10/13/2011  . Morbid obesity ([place]) 02/26/2011  . CHEST PAIN UNSPECIFIED 03/28/2010  . Essential hypertension 10/19/2007    Bjorn Loser, PTA  01/04/17, 2:24 PM [facility] [address] [place], [place], [address] Phone: [phone]   Fax:  [phone]  Name: ALINE WESCHE MRN: [id number] Date of Birth: 1971-06-05

## 2017-01-06 ENCOUNTER — Ambulatory Visit: Payer: Managed Care, Other (non HMO)

## 2017-01-06 ENCOUNTER — Encounter: Payer: Self-pay | Admitting: Physical Therapy

## 2017-01-06 ENCOUNTER — Ambulatory Visit: Payer: Managed Care, Other (non HMO) | Admitting: Occupational Therapy

## 2017-01-06 ENCOUNTER — Ambulatory Visit: Payer: Managed Care, Other (non HMO) | Admitting: Physical Therapy

## 2017-01-06 DIAGNOSIS — I69352 Hemiplegia and hemiparesis following cerebral infarction affecting left dominant side: Secondary | ICD-10-CM

## 2017-01-06 DIAGNOSIS — R471 Dysarthria and anarthria: Secondary | ICD-10-CM

## 2017-01-06 DIAGNOSIS — R278 Other lack of coordination: Secondary | ICD-10-CM

## 2017-01-06 DIAGNOSIS — R2681 Unsteadiness on feet: Secondary | ICD-10-CM

## 2017-01-06 DIAGNOSIS — R2689 Other abnormalities of gait and mobility: Secondary | ICD-10-CM

## 2017-01-06 DIAGNOSIS — I69015 Cognitive social or emotional deficit following nontraumatic subarachnoid hemorrhage: Secondary | ICD-10-CM

## 2017-01-06 NOTE — Patient Instructions (Signed)
  Please complete the assigned speech therapy homework and return it to your next session.  

## 2017-01-06 NOTE — Therapy (Signed)
[facility] [address] [place], [place], [address] Phone: [phone]   Fax:  [phone]  Occupational Therapy Treatment  Patient Details  Name: Abigail Campos MRN: [id number] Date of Birth: 1971-05-17 Referring Provider: Dr. Posey Pronto  Encounter Date: 01/06/2017      OT End of Session - 01/06/17 1512    Visit Number 27   Number of Visits 35   Date for OT Re-Evaluation 01/31/16   Authorization Type [employer] (240 visit limit combined with home health, estimated HH used 24 combined), no auth required    Authorization Time Period week 6/8- renewed 12/01/16   OT Start Time 1319   OT Stop Time 1358   OT Time Calculation (min) 39 min   Activity Tolerance Patient tolerated treatment well   Behavior During Therapy [facility] for tasks assessed/performed      Past Medical History:  Diagnosis Date  . Essential hypertension 10/19/2007   03-2010: metoprolol changed to bystolic (was not feeling well on it, no specific allergy or reaction)    . Hypertension   . Stroke [facility])     Past Surgical History:  Procedure Laterality Date  . ABDOMINAL HYSTERECTOMY  11-14-07   no oophorectomy per surgical report  . CESAREAN SECTION     [id number]  . INGUINAL HERNIA REPAIR     2004    There were no vitals filed for this visit.      Treatment: Sidelying AA/ROM shoulder flexion, scapular retraction/ protraction, elbow flexion/ extension, mod facilitation Supine AA/ROM chest press and small range shoulder flexion with bilateral UE's and PVC pipe frame,  max facilitation Gentle passive range finger extension then weightbearing through elbow with lateral weight shifts and shoulder abduction, min facilitation. Standing to weightbear through elbows over bench, on mat, cat and cow positions, mod facilitation for weightbearing through left side and for midline shift.                         OT Short Term Goals - 12/29/16 1521       OT SHORT TERM GOAL #1   Title Pt/ caregiver will be I with HEP.   Time 6   Period Weeks   Status Achieved     OT SHORT TERM GOAL #2   Title Pt/ family will verbalize understanding of LUE positioning to minimize pain and risk for injury, including splinting PRN.   Time 6   Period Weeks   Status Achieved  10/01/16, 10/26/16     OT SHORT TERM GOAL #3   Title Pt will donn shirt with min A.   Time 6   Period Weeks   Status Achieved  10/01/16  mod I     OT SHORT TERM GOAL #4   Title Pt will demonstrate 90* P/ROM shoulder flexion in LUE with pain less than or equal to 3/10.   Time 6   Period Weeks   Status Achieved  10/06/16 met to 90* with no pain     OT SHORT TERM GOAL #5   Title Pt will perform simple snack prep modified independently.   Time 6   Period Weeks   Status Achieved    12/01/16 made coffee.  12/16/15:  met per pt report     OT SHORT TERM GOAL #6   Title Pt will perform bathing at a supervision level   Time 6   Period Weeks   Status Achieved  10/01/16 met per pt report  OT SHORT TERM GOAL #7   Title Pt will perfom snack prep and light home management with min A .due 01/01/16   Baseline has only made coffee, folded towels   Time 4   Period Weeks   Status Achieved  12/25/15:  made breakfast/lunch, laundry     OT SHORT TERM GOAL #8   Title Pt will use LUE as a stabilizer/ gross assist for ADLS 96% of the time.   Time 4   Period Weeks   Status On-going  25%     OT SHORT TERM GOAL  #9   TITLE I with updated HEP.   Time 4   Period Weeks   Status On-going           OT Long Term Goals - 01/04/17 1640      OT LONG TERM GOAL #1   Title Pt will perform LB dressing with min A.   Time 12   Period Weeks   Status Achieved  10/01/16:  needs A for L shoe/brace     OT LONG TERM GOAL #2   Title Pt will use LUE as a stabilizer/ gross A with min A.   Time 12   Period Weeks   Status Achieved  uses for bathing, ,uses grossly 25 % of the time      OT LONG TERM GOAL #3   Title Pt will perform simple home management tasks with min A.- now  a short term goal   Time 12   Period Weeks   Status Achieved  not consistently performing has perfomed folding laundry.  01/04/17  met     OT LONG TERM GOAL #4   Title Pt will demonstrate adequate standing balance to retrieve a light weight object from overhead cabinet modified independently with RUE without LOB in prep for IADLS.   Time 12   Period Weeks   Status Achieved     OT LONG TERM GOAL #5   Title Pt will perform shower transfers modified independently   Time 12   Period Weeks   Status Achieved     OT LONG TERM GOAL #6   Title Pt will use LUE as a gross assist 50 % of the time for ADLs/ functional activity.   Time 8   Period Weeks   Status New     OT LONG TERM GOAL #7   Title Pt will demonstrate 25% finger flexion/ extension in prep for grasp release of a 1 inch block.   Time 4   Period Weeks   Status New     OT LONG TERM GOAL #8   Title Pt will demonstrate 30* of left shoulder flexion in prep for functional reach.   Time 8   Period Weeks   Status New               Plan - 01/06/17 1507    Clinical Impression Statement Pt is progressing slowly toward goals. She is limited by decreased body awareness, and decreased attention   Rehab Potential Good   Clinical Impairments Affecting Rehab Potential severity of deficits; cognitive deficits   OT Frequency 2x / week   OT Duration 8 weeks   OT Treatment/Interventions Self-care/ADL training;Moist Heat;Fluidtherapy;DME and/or AE instruction;Splinting;Patient/family education;Balance Financial planner;Contrast Bath;Ultrasound;Therapeutic exercise;Therapeutic activities;Cognitive remediation/compensation;Passive range of motion;Functional Mobility Training;Neuromuscular education;Cryotherapy;Electrical Stimulation;Parrafin;Energy conservation;Manual Therapy;Visual/perceptual remediation/compensation   Plan progress HEP as  able, neuro re-ed   OT Home Exercise Plan 09/07/16:  resting splint wear/care; HEP   Consulted and Agree  with Plan of Care Patient   Family Member Consulted husband      Patient will benefit from skilled therapeutic intervention in order to improve the following deficits and impairments:  Abnormal gait, Decreased coordination, Decreased range of motion, Difficulty walking, Impaired flexibility, Impaired sensation, Increased edema, Decreased safety awareness, Decreased endurance, Decreased activity tolerance, Decreased knowledge of precautions, Increased fascial restricitons, Impaired tone, Pain, Impaired UE functional use, Decreased knowledge of use of DME, Decreased balance, Decreased cognition, Decreased mobility, Decreased strength, Impaired perceived functional ability, Impaired vision/preception  Visit Diagnosis: Hemiplegia and hemiparesis following cerebral infarction affecting left dominant side (HCC)  Other abnormalities of gait and mobility  Other lack of coordination    Problem List Patient Active Problem List   Diagnosis Date Noted  . Prediabetes 12/29/2016  . Depression 12/29/2016  . Bradycardia, drug induced   . Muscle spasticity   . Obesity (BMI 30.0-34.9) 04/08/2016  . Hyperlipidemia 04/08/2016  . Basal ganglia hemorrhage ([place]) 04/08/2016  . Dysphagia as late effect of cerebrovascular disease   . Migraine with aura and without status migrainosus, not intractable   . Benign essential HTN   . Gait disturbance, post-stroke   . Hemiplegia, post-stroke ([place])   . Dysphagia, post-stroke   . Aphasia, post-stroke   . Bradycardia   . Dysarthria, post-stroke   . ICH (intracerebral hemorrhage) (HCC) - R basal ganglia due to hypertensive emergency 04/01/2016  . Upper airway cough syndrome 12/06/2015  . PCP NOTES >>> 09/25/2015  . Insomnia 12/28/2012  . Annual physical exam 11/23/2011  . Hemorrhoids 10/13/2011  . Morbid obesity ([place]) 02/26/2011  . CHEST PAIN UNSPECIFIED  03/28/2010  . Essential hypertension 10/19/2007    RINE,KATHRYN 01/06/2017, 3:13 PM  [facility] [address] [place], [place], [address] Phone: [phone]   Fax:  [phone]  Name: RETHEL SEBEK MRN: [id number] Date of Birth: [birth year]

## 2017-01-06 NOTE — Therapy (Addendum)
[facility] [address] [place], [place], [address] Phone: [phone]   Fax:  [phone]  Speech Language Pathology Treatment  Patient Details  Name: Abigail Campos MRN: [id number] Date of Birth: [birth year] Referring Provider: Delice Lesch, M.D.  Encounter Date: 01/06/2017      End of Session - 01/06/17 1508    Visit Number 22   Number of Visits 29   Date for SLP Re-Evaluation 03/05/17   SLP Start Time [id number]   SLP Stop Time  1445   SLP Time Calculation (min) 42 min   Activity Tolerance Patient tolerated treatment well      Past Medical History:  Diagnosis Date  . Essential hypertension 10/19/2007   03-2010: metoprolol changed to bystolic (was not feeling well on it, no specific allergy or reaction)    . Hypertension   . Stroke [facility])     Past Surgical History:  Procedure Laterality Date  . ABDOMINAL HYSTERECTOMY  11-14-07   no oophorectomy per surgical report  . CESAREAN SECTION     [id number]  . INGUINAL HERNIA REPAIR     2004    There were no vitals filed for this visit.      Subjective Assessment - 01/06/17 1408    Subjective "They cancelled both my OT times last week."   Currently in Pain? No/denies               ADULT SLP TREATMENT - 01/06/17 1409      General Information   Behavior/Cognition Alert;Cooperative;Pleasant mood     Treatment Provided   Treatment provided Cognitive-Linquistic     Cognitive-Linquistic Treatment   Treatment focused on Cognition   Skilled Treatment SLP educated pt re: her results from the Self--Administered Gerocognitive Evaluation and explained deficit areas and that SLP thought primary deficit was concentration/focus and awareness. SLP then guided pt to preplan, complete the task, and doublecheck 5-step written sequences. Pt did so 100%, and initially req'd SLP mod cues for A with mental flexibility (another way the steps could be done).In simple word  problems, pt req'd consistent SLP mod-max cues for time problems     Assessment / Recommendations / [place] with current plan of care     Progression Toward Goals   Progression toward goals Progressing toward goals          SLP Education - 01/06/17 1508    Education provided Yes   Education Details deficit areas shown by cognitive linguistic testing   Person(s) Educated Patient   Methods Explanation;Demonstration   Comprehension Verbalized understanding          SLP Short Term Goals - 01/06/17 1509      SLP SHORT TERM GOAL #1   Title pt will name 3 deficit areas independentlly, in two sessions   Baseline goal renewed/modified week of 10-26-16   Time 1   Period Weeks   Status On-going     SLP SHORT TERM GOAL #2   Title pt will demo selective attention to therapy tasks for 10 minutes in min-mod noisy environment   Status Achieved     SLP SHORT TERM GOAL #3   Title pt will complete tasks of simple cognitive linguistic organization with 90% success and rare min A   Status Achieved     SLP SHORT TERM GOAL #4   Title pt will attempt to self correct misarticulated speech in 50% of opportunities in structured speech tasks   Status Deferred  Pt is functional. Focus on cognitive linguistics          SLP Long Term Goals - 01/06/17 1509      SLP LONG TERM GOAL #1   Title pt will demo alternating attention in simple cognitive linguistic tasks with appropriate switch times, with compensations   Status Achieved     SLP LONG TERM GOAL #2   Title pt will demo emergent awareness in cognitive linguistic tasks by self correcting at least 75% of errors with nonverbal cues   Baseline renewed "ongoing" long term goals 01-01-17, for 4 weeks or 8 visits   Time 4   Period Weeks   Status On-going  and revised     SLP LONG TERM GOAL #3   Title pt will demo functional organization in simple to mod complex linguistic tasks with occasional verbal cues   Baseline renewed  01-01-17   Time 4   Period Weeks   Status On-going  and revised     SLP LONG TERM GOAL #4   Title pt will demo rate of speech in 5 minutes appropriate for 95-99% intelligibility in simple to mod complex conversation with occasional verbal cues   Status Deferred  (01-01-17) pt's speech is functional - focus on cognitive linguistics     SLP LONG TERM GOAL #5   Title pt will demo knowledge of fast speech rate decreasing intelligibility when in conversation and will self correct 90% of the time   Status On-going  (01-01-17) pt's speech is functional - focus on cognitive linguistics     SLP LONG TERM GOAL #6   Title pt will demo problem solving skills/organization skills adequate to achieve 85% success in simple/mod complex cognitive linquistic tasks   Time 4   Period Weeks   Status New          Plan - 01/06/17 1509    Clinical Impression Statement Pt continues to require SLP A for organizing, reasoning, error awareness adn attention to detail. All cognitive tasks required extended time. Continue skilld ST to maximize cognition and intelligibility for improved indepedence.   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 Prediabetes 12/29/2016  . Depression 12/29/2016  . Bradycardia, drug induced   . Muscle  spasticity   . Obesity (BMI 30.0-34.9) 04/08/2016  . Hyperlipidemia 04/08/2016  . Basal ganglia hemorrhage ([place]) 04/08/2016  . Dysphagia as late effect of cerebrovascular disease   . Migraine with aura and without status migrainosus, not intractable   . Benign essential HTN   . Gait disturbance, post-stroke   . Hemiplegia, post-stroke ([place])   . Dysphagia, post-stroke   . Aphasia, post-stroke   . Bradycardia   . Dysarthria, post-stroke   . ICH (intracerebral hemorrhage) (HCC) - R basal ganglia due to hypertensive emergency 04/01/2016  . Upper airway cough syndrome 12/06/2015  . PCP NOTES >>> 09/25/2015  . Insomnia 12/28/2012  . Annual physical exam 11/23/2011  .  Hemorrhoids 10/13/2011  . Morbid obesity ([place]) 02/26/2011  . CHEST PAIN UNSPECIFIED 03/28/2010  . Essential hypertension 10/19/2007    [facility] ,[place], [place]  01/06/2017, 3:10 PM  [facility] [address] [place], [place], [address] Phone: [phone]   Fax:  [phone]   Name: Abigail Campos MRN: [id number] Date of Birth: 08-19-1971

## 2017-01-07 ENCOUNTER — Encounter: Payer: Managed Care, Other (non HMO) | Admitting: Registered Nurse

## 2017-01-07 NOTE — Therapy (Signed)
[facility] [address] [place], [place], [address] Phone: [phone]   Fax:  [phone]  Physical Therapy Treatment  Patient Details  Name: Abigail Campos MRN: [id number] Date of Birth: 1971/10/03 Referring Provider: Dr. Posey Pronto  Encounter Date: 01/06/2017      PT End of Session - 01/06/17 1452    Visit Number 30   Number of Visits 41   Date for PT Re-Evaluation 01/28/17   Authorization Type [employer]-240 visit limit (including HH and OP). Per Lattie Haw, pt used 24 total HHPT, [place], and [place] visits.   PT Start Time 1450  pt in bathroom prior to PT appt   PT Stop Time 1530   PT Time Calculation (min) 40 min   Equipment Utilized During Treatment Gait belt   Activity Tolerance Patient tolerated treatment well   Behavior During Therapy WFL for tasks assessed/performed      Past Medical History:  Diagnosis Date  . Essential hypertension 10/19/2007   03-2010: metoprolol changed to bystolic (was not feeling well on it, no specific allergy or reaction)    . Hypertension   . Stroke [facility])     Past Surgical History:  Procedure Laterality Date  . ABDOMINAL HYSTERECTOMY  11-14-07   no oophorectomy per surgical report  . CESAREAN SECTION     [id number]  . INGUINAL HERNIA REPAIR     2004    There were no vitals filed for this visit.      Subjective Assessment - 01/06/17 1451    Subjective No new complaints. No falls or pain to report.    Patient is accompained by: Family member  daughter   Pertinent History HTN, hyperlipidemia, hx of migraine   Patient Stated Goals I want to walk and stand up without falling.    Currently in Pain? No/denies   Pain Score 0-No pain           [facility] Adult PT Treatment/Exercise - 01/06/17 1453      Transfers   Transfers Sit to Stand;Stand to Sit   Sit to Stand 5: Supervision;With upper extremity assist;From bed;From chair/3-in-1   Stand to Sit 5: Supervision;With upper  extremity assist;To bed;To chair/3-in-1     Ambulation/Gait   Ambulation/Gait Yes   Ambulation/Gait Assistance 4: Min guard;5: Supervision   Ambulation/Gait Assistance Details scanning enviroment with gait: cues to keep moving while scanning, cues to move head (not just eyes). decreased gait speed noted with enviromental scanning                              Ambulation Distance (Feet) 220 Feet  x 2   Assistive device Small based quad cane   Gait Pattern Step-through pattern;Decreased stance time - left;Decreased step length - right;Decreased hip/knee flexion - left;Left genu recurvatum;Left flexed knee in stance;Narrow base of support   Ambulation Surface Level;Indoor     High Level Balance   High Level Balance Activities Negotiating over obstacles;Figure 8 turns;Side stepping;Backward walking  with counter top/small base quad cane support   High Level Balance Comments next to counter top: side stepping and backwards walking x 3 laps each with min guard assist, cues on posture and ex form; with small based quad cane: stepping over 4 bolsters with cues on sequencing and posture x 4 laps, figure 8's around 2 cones x 3 laps with min gaurd assist and cues on techique, cane placement and posture.  Neuro Re-ed    Neuro Re-ed Details  tall kneeling with UE's on blue p-roll: mini squats x 10 reps with emphasis on midline posture and maintaining equal LE weight bearing.                                  PT Short Term Goals - 01/01/17 1410      PT SHORT TERM GOAL #1   Title Pt will be IND in performing progressed HEP to improve strength and balance. TARGET DATE FOR ALL STGS: 12/29/16   Baseline 01/01/17: met with current HEP   Status Achieved     PT SHORT TERM GOAL #2   Title Pt will improve BERG score to >/=48/56 to decr. falls risk.    Baseline 12/31/16: increased to 47/56 (from 45/56), not to goal   Status Partially Met     PT SHORT TERM GOAL #3   Title Pt will amb. 400' over  even/uneven terrain with LRAD at MOD I level to improve functional mobility.    Baseline 12/31/16: has met the distance with small based quad cane on level surfaces at supervision   Status Partially Met     PT SHORT TERM GOAL #4   Title Pt will trial [employer] and treadmill and write goals if appropriate to reintegrate pt into community exercises.   Status On-going           PT Long Term Goals - 12/01/16 1452      PT LONG TERM GOAL #1   Title Pt will verablize plans to join fitness center to maintain gains made in PT. TARGET DATE FOR ALL LTGS: 01/26/17   Baseline ALL UNMET LTGS WILL BE CARRIED OVER TO NEW POC: 01/26/17   Status New     PT LONG TERM GOAL #2   Title Pt will improve gait speed to >/=1.69f/sec. with LRAD to reduce falls risk.    Baseline not met 12/01/16: 0.882fsec.   Status On-going     PT LONG TERM GOAL #3   Title Pt will amb. 400' over even/paved surfaces with LRAD at MOD I level to improve functional mobility.    Baseline partially met 12/01/16   Status On-going     PT LONG TERM GOAL #4   Title Pt will perform TUG with LRAD in </=13.5sec. to improve function and decr. falls risk.    Baseline Not met on 12/01/16: 27.3   Status On-going     PT LONG TERM GOAL #6   Title Pt will improve BERG score to >/=51/56 to decr. falls risk.    Baseline 44/56 on 12/01/16   Status On-going           Plan - 01/06/17 1453    Clinical Impression Statement Pt continues to be challenged with high level balance activities and LE strengthening exercises. Pt is making steady progress toward goals and should benefit from continued PT to progress toward unmet goals .   Rehab Potential Good   Clinical Impairments Affecting Rehab Potential co-morbidities   PT Frequency 2x / week   PT Duration 8 weeks  original POC 2x/week for 8 weeks   PT Treatment/Interventions ADLs/Self Care Home Management;Biofeedback;Canalith Repostioning;Electrical Stimulation;Cognitive  remediation;Neuromuscular re-education;Balance training;Therapeutic exercise;Therapeutic activities;Functional mobility training;Stair training;Gait training;DME Instruction;Orthotic Fit/Training;Patient/family education;Vestibular;Manual techniques   PT Next Visit Plan Give information on Stroke support Group. Continue gait training with SBQC, strengthening LLE and balance training (tall kneel). Treadmill training.  PT Home Exercise Plan Strengthening HEP.    Consulted and Agree with Plan of Care Patient;Family member/caregiver   Family Member Consulted pt's mom: [name]      Patient will benefit from skilled therapeutic intervention in order to improve the following deficits and impairments:  Decreased endurance, Decreased knowledge of use of DME, Decreased strength, Impaired UE functional use, Impaired sensation, Decreased balance, Decreased mobility, Decreased range of motion, Decreased coordination, Impaired flexibility, Decreased cognition, Abnormal gait  Visit Diagnosis: Hemiplegia and hemiparesis following cerebral infarction affecting left dominant side (HCC)  Other abnormalities of gait and mobility  Other lack of coordination  Unsteadiness on feet     Problem List Patient Active Problem List   Diagnosis Date Noted  . Prediabetes 12/29/2016  . Depression 12/29/2016  . Bradycardia, drug induced   . Muscle spasticity   . Obesity (BMI 30.0-34.9) 04/08/2016  . Hyperlipidemia 04/08/2016  . Basal ganglia hemorrhage ([place]) 04/08/2016  . Dysphagia as late effect of cerebrovascular disease   . Migraine with aura and without status migrainosus, not intractable   . Benign essential HTN   . Gait disturbance, post-stroke   . Hemiplegia, post-stroke ([place])   . Dysphagia, post-stroke   . Aphasia, post-stroke   . Bradycardia   . Dysarthria, post-stroke   . ICH (intracerebral hemorrhage) (HCC) - R basal ganglia due to hypertensive emergency 04/01/2016  . Upper airway cough syndrome  12/06/2015  . PCP NOTES >>> 09/25/2015  . Insomnia 12/28/2012  . Annual physical exam 11/23/2011  . Hemorrhoids 10/13/2011  . Morbid obesity ([place]) 02/26/2011  . CHEST PAIN UNSPECIFIED 03/28/2010  . Essential hypertension 10/19/2007    Willow Ora, PTA, [facility] [address] [place], [place] [address] [phone] 01/07/17, 1:06 PM   Name: Abigail Campos MRN: [id number] Date of Birth: 1971/07/18

## 2017-01-08 ENCOUNTER — Ambulatory Visit: Payer: Managed Care, Other (non HMO)

## 2017-01-08 DIAGNOSIS — I69352 Hemiplegia and hemiparesis following cerebral infarction affecting left dominant side: Secondary | ICD-10-CM | POA: Diagnosis not present

## 2017-01-08 DIAGNOSIS — I69015 Cognitive social or emotional deficit following nontraumatic subarachnoid hemorrhage: Secondary | ICD-10-CM

## 2017-01-08 DIAGNOSIS — R471 Dysarthria and anarthria: Secondary | ICD-10-CM

## 2017-01-08 NOTE — Patient Instructions (Signed)
  Please complete the assigned speech therapy homework and return it to your next session.  

## 2017-01-08 NOTE — Therapy (Addendum)
[facility] [address] [place], [place], [address] Phone: [phone]   Fax:  [phone]  Speech Language Pathology Treatment  Patient Details  Name: Abigail Campos MRN: [id number] Date of Birth: 02/04/1971 Referring Provider: Delice Lesch, M.D.  Encounter Date: 01/08/2017      End of Session - 01/08/17 1515    Visit Number 23   Number of Visits 29   Date for SLP Re-Evaluation 03/05/17   SLP Start Time 1319   SLP Stop Time  [id number]   SLP Time Calculation (min) 45 min      Past Medical History:  Diagnosis Date  . Essential hypertension 10/19/2007   03-2010: metoprolol changed to bystolic (was not feeling well on it, no specific allergy or reaction)    . Hypertension   . Stroke [facility])     Past Surgical History:  Procedure Laterality Date  . ABDOMINAL HYSTERECTOMY  11-14-07   no oophorectomy per surgical report  . CESAREAN SECTION     [id number]  . INGUINAL HERNIA REPAIR     2004    There were no vitals filed for this visit.      Subjective Assessment - 01/08/17 1337    Subjective Pt arrived with homework completed.   Currently in Pain? No/denies               ADULT SLP TREATMENT - 01/08/17 1338      General Information   Behavior/Cognition Alert;Cooperative;Pleasant mood     Treatment Provided   Treatment provided Cognitive-Linquistic     Cognitive-Linquistic Treatment   Treatment focused on Cognition   Skilled Treatment SLP facilitated pt's cognitive linguistic skills today by focus on awareness and attention to detail in sequencing task. Pt req'd min-mod A for awareness (emergent) occasionally. Selective attention and alternating attention appeared good. Pt asked SLP if she would be able to ever return to schooland SLP told pt fall 2018 is possible but not probable.     Assessment / Recommendations / Plan   Plan Continue with current plan of care     Progression Toward Goals   Progression toward goals Progressing toward goals     SLP Education Pt was educated/introduced today to basic understanding of neurocognitive testing, to assess her readiness to return to school, sometime in the future.       SLP Short Term Goals - 01/06/17 1509      SLP SHORT TERM GOAL #1   Title pt will name 3 deficit areas independentlly, in two sessions   Baseline goal renewed/modified week of 10-26-16   Time 1   Period Weeks   Status On-going     SLP SHORT TERM GOAL #2   Title pt will demo selective attention to therapy tasks for 10 minutes in min-mod noisy environment   Status Achieved     SLP SHORT TERM GOAL #3   Title pt will complete tasks of simple cognitive linguistic organization with 90% success and rare min A   Status Achieved     SLP SHORT TERM GOAL #4   Title pt will attempt to self correct misarticulated speech in 50% of opportunities in structured speech tasks   Status Deferred  Pt is functional. Focus on cognitive linguistics          SLP Long Term Goals - 01/08/17 1517      SLP LONG TERM GOAL #1   Title pt will demo alternating attention in simple cognitive linguistic tasks with appropriate switch  times, with compensations   Status Achieved     SLP LONG TERM GOAL #2   Title pt will demo emergent awareness in cognitive linguistic tasks by self correcting at least 75% of errors with nonverbal cues over three sessions   Baseline renewed "ongoing" long term goals 01-01-17, for 4 weeks or 8 visits;  01-08-17   Time 3   Period Weeks   Status Revised  and revised     SLP LONG TERM GOAL #3   Title pt will demo functional organization in simple to mod complex linguistic tasks with occasional verbal cues over three sessions   Baseline renewed 01-01-17;    Time 3   Period Weeks   Status Revised  and revised     SLP LONG TERM GOAL #4   Title pt will demo rate of speech in 5 minutes appropriate for 95-99% intelligibility in simple to mod complex  conversation with occasional verbal cues   Status Deferred  (01-01-17) pt's speech is functional - focus on cognitive linguistics     SLP LONG TERM GOAL #5   Title pt will demo knowledge of fast speech rate decreasing intelligibility when in conversation and will self correct 90% of the time   Status Deferred  (01-01-17) pt's speech is functional - focus on cognitive linguistics     SLP LONG TERM GOAL #6   Title pt will demo problem solving skills adequate to achieve 85% success in simple/mod complex cognitive linquistic tasks over three sessions   Baseline 01-08-17   Time 3   Period Weeks   Status Revised          Plan - 01/08/17 1516    Clinical Impression Statement Pt continues to require SLP A for error awareness adn attention to detail. Pt's alternating attention and selective attention in a semi-familiar task looked better today compared to last month. All cognitive tasks cont to require extended time. Continue skilld ST to maximize cognition and intelligibility for improved indepedence.   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 Prediabetes 12/29/2016  . Depression 12/29/2016  . Bradycardia, drug induced   . Muscle spasticity   . Obesity (BMI 30.0-34.9) 04/08/2016  . Hyperlipidemia 04/08/2016  .  Basal ganglia hemorrhage ([place]) 04/08/2016  . Dysphagia as late effect of cerebrovascular disease   . Migraine with aura and without status migrainosus, not intractable   . Benign essential HTN   . Gait disturbance, post-stroke   . Hemiplegia, post-stroke ([place])   . Dysphagia, post-stroke   . Aphasia, post-stroke   . Bradycardia   . Dysarthria, post-stroke   . ICH (intracerebral hemorrhage) (HCC) - R basal ganglia due to hypertensive emergency 04/01/2016  . Upper airway cough syndrome 12/06/2015  . PCP NOTES >>> 09/25/2015  . Insomnia 12/28/2012  . Annual physical exam 11/23/2011  . Hemorrhoids 10/13/2011  . Morbid obesity ([place]) 02/26/2011  . CHEST PAIN UNSPECIFIED 03/28/2010  . Essential  hypertension 10/19/2007    [place] ,[place], [place]  01/08/2017, 3:20 PM  [facility] [address] [place], [place], [address] Phone: [phone]   Fax:  [phone]   Name: Abigail Campos MRN: [id number] Date of Birth: Nov 06, 1971

## 2017-01-13 ENCOUNTER — Other Ambulatory Visit: Payer: Self-pay | Admitting: Physical Medicine & Rehabilitation

## 2017-01-13 DIAGNOSIS — I610 Nontraumatic intracerebral hemorrhage in hemisphere, subcortical: Secondary | ICD-10-CM

## 2017-01-14 ENCOUNTER — Ambulatory Visit: Payer: Managed Care, Other (non HMO) | Attending: Physical Medicine & Rehabilitation | Admitting: Occupational Therapy

## 2017-01-14 ENCOUNTER — Ambulatory Visit: Payer: Managed Care, Other (non HMO) | Admitting: Physical Therapy

## 2017-01-14 ENCOUNTER — Ambulatory Visit: Payer: Managed Care, Other (non HMO)

## 2017-01-14 DIAGNOSIS — R2689 Other abnormalities of gait and mobility: Secondary | ICD-10-CM | POA: Insufficient documentation

## 2017-01-14 DIAGNOSIS — R2681 Unsteadiness on feet: Secondary | ICD-10-CM

## 2017-01-14 DIAGNOSIS — R471 Dysarthria and anarthria: Secondary | ICD-10-CM

## 2017-01-14 DIAGNOSIS — R278 Other lack of coordination: Secondary | ICD-10-CM

## 2017-01-14 DIAGNOSIS — I69015 Cognitive social or emotional deficit following nontraumatic subarachnoid hemorrhage: Secondary | ICD-10-CM

## 2017-01-14 DIAGNOSIS — I69352 Hemiplegia and hemiparesis following cerebral infarction affecting left dominant side: Secondary | ICD-10-CM

## 2017-01-14 DIAGNOSIS — M25512 Pain in left shoulder: Secondary | ICD-10-CM | POA: Diagnosis present

## 2017-01-14 DIAGNOSIS — R41841 Cognitive communication deficit: Secondary | ICD-10-CM | POA: Diagnosis present

## 2017-01-14 DIAGNOSIS — I69115 Cognitive social or emotional deficit following nontraumatic intracerebral hemorrhage: Secondary | ICD-10-CM | POA: Diagnosis present

## 2017-01-14 DIAGNOSIS — R279 Unspecified lack of coordination: Secondary | ICD-10-CM | POA: Insufficient documentation

## 2017-01-14 NOTE — Therapy (Signed)
[facility] [address] [place], [place], [address] Phone: [phone]   Fax:  [phone]  Physical Therapy Treatment  Patient Details  Name: Abigail Campos MRN: [id number] Date of Birth: 03-27-1971 Referring Provider: Dr. Posey Pronto  Encounter Date: 01/14/2017      PT End of Session - 01/14/17 1241    Visit Number 31   Number of Visits 14   Date for PT Re-Evaluation 01/28/17   Authorization Type [employer]-240 visit limit (including HH and OP). Per Lattie Haw, pt used 24 total HHPT, [place], and [place] visits.   PT Start Time 1235   PT Stop Time 1315   PT Time Calculation (min) 40 min   Equipment Utilized During Treatment Gait belt   Activity Tolerance Patient tolerated treatment well   Behavior During Therapy WFL for tasks assessed/performed      Past Medical History:  Diagnosis Date  . Essential hypertension 10/19/2007   03-2010: metoprolol changed to bystolic (was not feeling well on it, no specific allergy or reaction)    . Hypertension   . Stroke [facility])     Past Surgical History:  Procedure Laterality Date  . ABDOMINAL HYSTERECTOMY  11-14-07   no oophorectomy per surgical report  . CESAREAN SECTION     [id number]  . INGUINAL HERNIA REPAIR     2004    There were no vitals filed for this visit.      Subjective Assessment - 01/14/17 1239    Subjective No falls or pain. Having increased cramping in left foot. Ran out of Lasix and does not have refills so has not taken it since Saturday when she ran out. Adivsed pt to call her MD to verify she is suppossed to be off this medication.    Pertinent History HTN, hyperlipidemia, hx of migraine   Patient Stated Goals I want to walk and stand up without falling.    Currently in Pain? No/denies   Pain Score 0-No pain            [facility] Adult PT Treatment/Exercise - 01/14/17 1243      Transfers   Transfers Sit to Stand;Stand to Sit   Sit to Stand 5: Supervision;With  upper extremity assist;From bed;From chair/3-in-1   Stand to Sit 5: Supervision;With upper extremity assist;To bed;To chair/3-in-1     Ambulation/Gait   Ambulation/Gait Yes   Ambulation/Gait Assistance 4: Min guard;5: Supervision   Ambulation/Gait Assistance Details scanning enviroment and engaged pt in conversation, continued to need cues for weight shifting onto left LE with stance and for more equal step length    Ambulation Distance (Feet) 420 Feet   Assistive device Small based quad cane   Gait Pattern Step-through pattern;Decreased stance time - left;Decreased step length - right;Decreased hip/knee flexion - left;Left genu recurvatum;Left flexed knee in stance;Narrow base of support   Ambulation Surface Level;Indoor     Neuro Re-ed    Neuro Re-ed Details  standing without UE support: right foot taps up to 4 inch box and back down x 10 reps with min guard to min assist for balance, with right foot forward on 4 inch box- left leg minisquats 2 sets of 5 reps. tall kneeling with UE's on blue p-roll: mini squats x 10 reps with emphasis on use of hips/knees to return back to tall kneeling, assistance needed with last 4 reps due to fatigue.  PT Short Term Goals - 01/01/17 1410      PT SHORT TERM GOAL #1   Title Pt will be IND in performing progressed HEP to improve strength and balance. TARGET DATE FOR ALL STGS: 12/29/16   Baseline 01/01/17: met with current HEP   Status Achieved     PT SHORT TERM GOAL #2   Title Pt will improve BERG score to >/=48/56 to decr. falls risk.    Baseline 12/31/16: increased to 47/56 (from 45/56), not to goal   Status Partially Met     PT SHORT TERM GOAL #3   Title Pt will amb. 400' over even/uneven terrain with LRAD at MOD I level to improve functional mobility.    Baseline 12/31/16: has met the distance with small based quad cane on level surfaces at supervision   Status Partially Met     PT  SHORT TERM GOAL #4   Title Pt will trial [employer] and treadmill and write goals if appropriate to reintegrate pt into community exercises.   Status On-going           PT Long Term Goals - 12/01/16 1452      PT LONG TERM GOAL #1   Title Pt will verablize plans to join fitness center to maintain gains made in PT. TARGET DATE FOR ALL LTGS: 01/26/17   Baseline ALL UNMET LTGS WILL BE CARRIED OVER TO NEW POC: 01/26/17   Status New     PT LONG TERM GOAL #2   Title Pt will improve gait speed to >/=1.42f/sec. with LRAD to reduce falls risk.    Baseline not met 12/01/16: 0.885fsec.   Status On-going     PT LONG TERM GOAL #3   Title Pt will amb. 400' over even/paved surfaces with LRAD at MOD I level to improve functional mobility.    Baseline partially met 12/01/16   Status On-going     PT LONG TERM GOAL #4   Title Pt will perform TUG with LRAD in </=13.5sec. to improve function and decr. falls risk.    Baseline Not met on 12/01/16: 27.3   Status On-going     PT LONG TERM GOAL #6   Title Pt will improve BERG score to >/=51/56 to decr. falls risk.    Baseline 44/56 on 12/01/16   Status On-going           Plan - 01/14/17 1242    Clinical Impression Statement Today's skilled session continued to focus on dynamic gait balance and LE strengthening exercises. Pt is making steady progress and should benefit from continued PT to progress toward unmet goals.    Rehab Potential Good   Clinical Impairments Affecting Rehab Potential co-morbidities   PT Frequency 2x / week   PT Duration 8 weeks  original POC 2x/week for 8 weeks   PT Treatment/Interventions ADLs/Self Care Home Management;Biofeedback;Canalith Repostioning;Electrical Stimulation;Cognitive remediation;Neuromuscular re-education;Balance training;Therapeutic exercise;Therapeutic activities;Functional mobility training;Stair training;Gait training;DME Instruction;Orthotic Fit/Training;Patient/family education;Vestibular;Manual  techniques   PT Next Visit Plan  Continue gait training with SBQC, strengthening LLE and balance training (tall kneel). Treadmill training.     PT Home Exercise Plan Strengthening HEP.    Consulted and Agree with Plan of Care Patient;Family member/caregiver   Family Member Consulted pt's mom: [name]    Patient will benefit from skilled therapeutic intervention in order to improve the following deficits and impairments:  Decreased endurance, Decreased knowledge of use of DME, Decreased strength, Impaired UE functional use, Impaired sensation, Decreased balance, Decreased mobility, Decreased  range of motion, Decreased coordination, Impaired flexibility, Decreased cognition, Abnormal gait  Visit Diagnosis: Hemiplegia and hemiparesis following cerebral infarction affecting left dominant side (HCC)  Other lack of coordination  Unsteadiness on feet  Other abnormalities of gait and mobility     Problem List Patient Active Problem List   Diagnosis Date Noted  . Prediabetes 12/29/2016  . Depression 12/29/2016  . Bradycardia, drug induced   . Muscle spasticity   . Obesity (BMI 30.0-34.9) 04/08/2016  . Hyperlipidemia 04/08/2016  . Basal ganglia hemorrhage ([place]) 04/08/2016  . Dysphagia as late effect of cerebrovascular disease   . Migraine with aura and without status migrainosus, not intractable   . Benign essential HTN   . Gait disturbance, post-stroke   . Hemiplegia, post-stroke ([place])   . Dysphagia, post-stroke   . Aphasia, post-stroke   . Bradycardia   . Dysarthria, post-stroke   . ICH (intracerebral hemorrhage) (HCC) - R basal ganglia due to hypertensive emergency 04/01/2016  . Upper airway cough syndrome 12/06/2015  . PCP NOTES >>> 09/25/2015  . Insomnia 12/28/2012  . Annual physical exam 11/23/2011  . Hemorrhoids 10/13/2011  . Morbid obesity ([place]) 02/26/2011  . CHEST PAIN UNSPECIFIED 03/28/2010  . Essential hypertension 10/19/2007    [place], PTA, [facility] [address], [place], [place] [address] [phone] 01/14/17, 4:43 PM   Name: Abigail Campos MRN: [id number] Date of Birth: 1971/03/09

## 2017-01-14 NOTE — Therapy (Signed)
[facility] [address] [place], [place], [address] Phone: [phone]   Fax:  [phone]  Occupational Therapy Treatment  Patient Details  Name: Abigail Campos MRN: [id number] Date of Birth: July 28, 1971 Referring Provider: Dr. Posey Pronto  Encounter Date: 01/14/2017      OT End of Session - 01/14/17 1501    Visit Number 28   Number of Visits 35   Date for OT Re-Evaluation 01/31/16   Authorization Type [employer] (240 visit limit combined with home health, estimated HH used 24 combined), no auth required    OT Start Time 1315   OT Stop Time 1400   OT Time Calculation (min) 45 min   Activity Tolerance Patient tolerated treatment well   Behavior During Therapy [place] for tasks assessed/performed      Past Medical History:  Diagnosis Date  . Essential hypertension 10/19/2007   03-2010: metoprolol changed to bystolic (was not feeling well on it, no specific allergy or reaction)    . Hypertension   . Stroke [facility])     Past Surgical History:  Procedure Laterality Date  . ABDOMINAL HYSTERECTOMY  11-14-07   no oophorectomy per surgical report  . CESAREAN SECTION     [id number]  . INGUINAL HERNIA REPAIR     2004    There were no vitals filed for this visit.                    OT Treatments/Exercises (OP) - 01/14/17 1454      ADLs   Grooming Patient reports that she is now using her right hand to hold tooth brush, wash left thigh, and hold hair brush.        Neurological Re-education Exercises   Other Exercises 1 Neuromuscular reeducation to address control and activation in right upper extremity.  Patient did best with forced use concept and with visual attention to limb.  Patient has difficulty initiating movement, but once illicited, can repeat.  Worked on elbow isolated control of flexion and extension and forearm supination and pronation.  In sidelying, patient able to isolate wrist ulnar and radial  deviation.                    OT Education - 01/14/17 1500    Education provided Yes   Education Details reviewed plan of care and discussed discharge week of 2/17.  Patient in agreement, nervous to take a break from therapy, but eager to not have mom burdened by driving to all these appointments.     Person(s) Educated Patient   Methods Explanation   Comprehension Verbalized understanding          OT Short Term Goals - 12/29/16 1521      OT SHORT TERM GOAL #1   Title Pt/ caregiver will be I with HEP.   Time 6   Period Weeks   Status Achieved     OT SHORT TERM GOAL #2   Title Pt/ family will verbalize understanding of LUE positioning to minimize pain and risk for injury, including splinting PRN.   Time 6   Period Weeks   Status Achieved  10/01/16, 10/26/16     OT SHORT TERM GOAL #3   Title Pt will donn shirt with min A.   Time 6   Period Weeks   Status Achieved  10/01/16  mod I     OT SHORT TERM GOAL #4   Title Pt will demonstrate 90* P/ROM shoulder flexion  in LUE with pain less than or equal to 3/10.   Time 6   Period Weeks   Status Achieved  10/06/16 met to 90* with no pain     OT SHORT TERM GOAL #5   Title Pt will perform simple snack prep modified independently.   Time 6   Period Weeks   Status Achieved    12/01/16 made coffee.  12/16/15:  met per pt report     OT SHORT TERM GOAL #6   Title Pt will perform bathing at a supervision level   Time 6   Period Weeks   Status Achieved  10/01/16 met per pt report     OT SHORT TERM GOAL #7   Title Pt will perfom snack prep and light home management with min A .due 01/01/16   Baseline has only made coffee, folded towels   Time 4   Period Weeks   Status Achieved  12/25/15:  made breakfast/lunch, laundry     OT SHORT TERM GOAL #8   Title Pt will use LUE as a stabilizer/ gross assist for ADLS 11% of the time.   Time 4   Period Weeks   Status On-going  25%     OT SHORT TERM GOAL  #9   TITLE I with  updated HEP.   Time 4   Period Weeks   Status On-going           OT Long Term Goals - 01/04/17 1640      OT LONG TERM GOAL #1   Title Pt will perform LB dressing with min A.   Time 12   Period Weeks   Status Achieved  10/01/16:  needs A for L shoe/brace     OT LONG TERM GOAL #2   Title Pt will use LUE as a stabilizer/ gross A with min A.   Time 12   Period Weeks   Status Achieved  uses for bathing, ,uses grossly 25 % of the time     OT LONG TERM GOAL #3   Title Pt will perform simple home management tasks with min A.- now  a short term goal   Time 12   Period Weeks   Status Achieved  not consistently performing has perfomed folding laundry.  01/04/17  met     OT LONG TERM GOAL #4   Title Pt will demonstrate adequate standing balance to retrieve a light weight object from overhead cabinet modified independently with RUE without LOB in prep for IADLS.   Time 12   Period Weeks   Status Achieved     OT LONG TERM GOAL #5   Title Pt will perform shower transfers modified independently   Time 12   Period Weeks   Status Achieved     OT LONG TERM GOAL #6   Title Pt will use LUE as a gross assist 50 % of the time for ADLs/ functional activity.   Time 8   Period Weeks   Status New     OT LONG TERM GOAL #7   Title Pt will demonstrate 25% finger flexion/ extension in prep for grasp release of a 1 inch block.   Time 4   Period Weeks   Status New     OT LONG TERM GOAL #8   Title Pt will demonstrate 30* of left shoulder flexion in prep for functional reach.   Time 8   Period Weeks   Status New  Plan - 01/14/17 1502    Clinical Impression Statement Patient is showing more functional use of right UE during simple self care skills.     Rehab Potential Good   Clinical Impairments Affecting Rehab Potential severity of deficits; cognitive deficits   OT Frequency 2x / week   OT Duration 8 weeks   OT Treatment/Interventions Self-care/ADL  training;Moist Heat;Fluidtherapy;DME and/or AE instruction;Splinting;Patient/family education;Balance Financial planner;Contrast Bath;Ultrasound;Therapeutic exercise;Therapeutic activities;Cognitive remediation/compensation;Passive range of motion;Functional Mobility Training;Neuromuscular education;Cryotherapy;Electrical Stimulation;Parrafin;Energy conservation;Manual Therapy;Visual/perceptual remediation/compensation   Plan progress HEP as able, NMR RUE, review remaining goals   Consulted and Agree with Plan of Care Patient      Patient will benefit from skilled therapeutic intervention in order to improve the following deficits and impairments:  Abnormal gait, Decreased coordination, Decreased range of motion, Difficulty walking, Impaired flexibility, Impaired sensation, Increased edema, Decreased safety awareness, Decreased endurance, Decreased activity tolerance, Decreased knowledge of precautions, Increased fascial restricitons, Impaired tone, Pain, Impaired UE functional use, Decreased knowledge of use of DME, Decreased balance, Decreased cognition, Decreased mobility, Decreased strength, Impaired perceived functional ability, Impaired vision/preception  Visit Diagnosis: Hemiplegia and hemiparesis following cerebral infarction affecting left dominant side (HCC)  Other lack of coordination  Unsteadiness on feet  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 Prediabetes 12/29/2016  . Depression 12/29/2016  . Bradycardia, drug induced   . Muscle spasticity   . Obesity (BMI 30.0-34.9) 04/08/2016  . Hyperlipidemia 04/08/2016  . Basal ganglia hemorrhage ([place]) 04/08/2016  . Dysphagia as late effect of cerebrovascular disease   . Migraine with aura and without status migrainosus, not intractable   . Benign  essential HTN   . Gait disturbance, post-stroke   . Hemiplegia, post-stroke ([place])   . Dysphagia, post-stroke   . Aphasia, post-stroke   . Bradycardia   . Dysarthria, post-stroke   . ICH (intracerebral hemorrhage) (HCC) - R basal ganglia due to hypertensive emergency 04/01/2016  . Upper airway cough syndrome 12/06/2015  . PCP NOTES >>> 09/25/2015  . Insomnia 12/28/2012  . Annual physical exam 11/23/2011  . Hemorrhoids 10/13/2011  . Morbid obesity ([place]) 02/26/2011  . CHEST PAIN UNSPECIFIED 03/28/2010  . Essential hypertension 10/19/2007    Mariah Milling 01/14/2017, 3:04 PM  [facility] [address] [place], [place], [address] Phone: [phone]   Fax:  [phone]  Name: Abigail Campos MRN: [id number] Date of Birth: 25-Sep-1971

## 2017-01-14 NOTE — Patient Instructions (Signed)
  Please complete the assigned speech therapy homework and return it to your next session.  

## 2017-01-14 NOTE — Therapy (Addendum)
[facility] [address] [place], [place], [address] Phone: [phone]   Fax:  [phone]  Speech Language Pathology Treatment  Patient Details  Name: Abigail Campos MRN: [id number] Date of Birth: [birth year] Referring Provider: Delice Lesch, M.D.  Encounter Date: 01/14/2017      End of Session - 01/14/17 1502    Visit Number 24   Number of Visits 29   Date for SLP Re-Evaluation 03/05/17   SLP Start Time 1404   SLP Stop Time  1445   SLP Time Calculation (min) 41 min   Activity Tolerance Patient tolerated treatment well      Past Medical History:  Diagnosis Date  . Essential hypertension 10/19/2007   03-2010: metoprolol changed to bystolic (was not feeling well on it, no specific allergy or reaction)    . Hypertension   . Stroke [facility])     Past Surgical History:  Procedure Laterality Date  . ABDOMINAL HYSTERECTOMY  11-14-07   no oophorectomy per surgical report  . CESAREAN SECTION     [id number]  . INGUINAL HERNIA REPAIR     2004    There were no vitals filed for this visit.      Subjective Assessment - 01/14/17 1406    Subjective Pt arrived with homework completed.               ADULT SLP TREATMENT - 01/14/17 1406      General Information   Behavior/Cognition Alert;Cooperative;Pleasant mood     Treatment Provided   Treatment provided Cognitive-Linquistic     Cognitive-Linquistic Treatment   Treatment focused on Cognition   Skilled Treatment Pt with rushes of speech throughout therapy session but minimal enough as to not significantly decr intelligibility. Pt provided deficit areas with SLP mod-max cues. SLP repeated deficit areas x4-5 throughout the session and pt req'd faded cues at min A by session end when asked what deficit areas were ("What are we working on in [place]?"). Pt emergent awareness with > 1-step process stimuli was minimal even with SLP bringing it to front of pt's mind  repeatedly during session when talking about slowing down and making sure pt catches all details of written or spoken information.     Assessment / Recommendations / Plan   Plan Continue with current plan of care     Progression Toward Goals   Progression toward goals Progressing toward goals            SLP Short Term Goals - 01/14/17 1517      SLP SHORT TERM GOAL #1   Title pt will name 3 deficit areas independentlly, in two sessions   Baseline goal renewed/modified week of 10-26-16   Time 1   Period Weeks   Status On-going     SLP SHORT TERM GOAL #2   Title pt will demo selective attention to therapy tasks for 10 minutes in min-mod noisy environment   Status Achieved     SLP SHORT TERM GOAL #3   Title pt will complete tasks of simple cognitive linguistic organization with 90% success and rare min A   Status Achieved     SLP SHORT TERM GOAL #4   Title pt will attempt to self correct misarticulated speech in 50% of opportunities in structured speech tasks   Status Deferred  Pt is functional. Focus on cognitive linguistics          SLP Long Term Goals - 01/14/17 [id number]  SLP LONG TERM GOAL #1   Title pt will demo alternating attention in simple cognitive linguistic tasks with appropriate switch times, with compensations   Status Achieved     SLP LONG TERM GOAL #2   Title pt will demo emergent awareness in cognitive linguistic tasks by self correcting at least 75% of errors with nonverbal cues over three sessions   Baseline renewed "ongoing" long term goals 01-01-17, for 4 weeks or 8 visits;  01-08-17   Time 2   Period Weeks   Status Revised  and revised     SLP LONG TERM GOAL #3   Title pt will demo functional organization in simple to mod complex linguistic tasks with occasional verbal cues over three sessions   Baseline renewed 01-01-17;    Time 2   Period Weeks   Status Revised  and revised     SLP LONG TERM GOAL #4   Title pt will demo rate of speech in  5 minutes appropriate for 95-99% intelligibility in simple to mod complex conversation with occasional verbal cues   Status Deferred  (01-01-17) pt's speech is functional - focus on cognitive linguistics     SLP LONG TERM GOAL #5   Title pt will demo knowledge of fast speech rate decreasing intelligibility when in conversation and will self correct 90% of the time   Status Deferred  (01-01-17) pt's speech is functional - focus on cognitive linguistics     SLP LONG TERM GOAL #6   Title pt will demo problem solving skills adequate to achieve 85% success in simple/mod complex cognitive linquistic tasks over three sessions   Baseline 01-08-17   Time 2   Period Weeks   Status Revised          Plan - 01/14/17 1502    Clinical Impression Statement Pt continues to require SLP A for error awareness adn attention to detail - it remains difficult to see progress with these areas. Pt's alternating attention and selective attention in a novel task are more challenging than with a familiar task, and with any noise/activity in the environement pt success decr's. All cognitive tasks cont to require extended time. Continue skilld ST to maximize cognition and intelligibility for improved indepedence.   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 Prediabetes 12/29/2016  . Depression 12/29/2016  . Bradycardia, drug induced   . Muscle spasticity   . Obesity (BMI 30.0-34.9) 04/08/2016  . Hyperlipidemia 04/08/2016  . Basal ganglia hemorrhage ([place]) 04/08/2016  . Dysphagia as late effect of cerebrovascular disease   . Migraine with aura and without status migrainosus, not intractable   . Benign essential HTN   . Gait disturbance, post-stroke   . Hemiplegia, post-stroke ([place])   . Dysphagia, post-stroke   . Aphasia, post-stroke   . Bradycardia   . Dysarthria, post-stroke   . ICH (intracerebral hemorrhage) (HCC) - R basal ganglia due to hypertensive emergency 04/01/2016  .  Upper airway cough syndrome 12/06/2015  . PCP NOTES >>> 09/25/2015  . Insomnia 12/28/2012  . Annual physical exam 11/23/2011  . Hemorrhoids 10/13/2011  . Morbid obesity ([place]) 02/26/2011  . CHEST PAIN UNSPECIFIED 03/28/2010  . Essential hypertension 10/19/2007    [facility] ,[place], [place]  01/14/2017, 3:25 PM  [facility] [address] [place], [place], [address] Phone: [phone]   Fax:  [phone]   Name: Abigail Campos MRN: [id number] Date of Birth: 1971/11/30

## 2017-01-15 ENCOUNTER — Telehealth: Payer: Self-pay | Admitting: Internal Medicine

## 2017-01-15 MED ORDER — FUROSEMIDE 20 MG PO TABS: 20.0000 mg | ORAL_TABLET | Freq: Every day | ORAL | 5 refills | Status: DC

## 2017-01-15 NOTE — Telephone Encounter (Signed)
Patient is requesting a refill of furosemide (LASIX) 20 MG tablet  Patient has not had this medication for a week. Please advise  Pharmacy: [facility] [place], [place]

## 2017-01-15 NOTE — Telephone Encounter (Signed)
Rx sent 

## 2017-01-15 NOTE — Telephone Encounter (Signed)
Pt's spouse dropped off document for Provider to fill out [facility] Paperwork). Pt would like documents to be faxed at [phone], please inform pt when documents are faxed. Documents put at front office tray.

## 2017-01-19 ENCOUNTER — Ambulatory Visit: Payer: Managed Care, Other (non HMO) | Admitting: Speech Pathology

## 2017-01-19 ENCOUNTER — Ambulatory Visit: Payer: Managed Care, Other (non HMO) | Admitting: Physical Therapy

## 2017-01-19 NOTE — Telephone Encounter (Signed)
Paperwork in Dr. Ethel Rana blue folder.

## 2017-01-19 NOTE — Telephone Encounter (Signed)
Have you seen paperwork? 

## 2017-01-19 NOTE — Telephone Encounter (Signed)
Caller name: Mr. Ash  Relation to [id number]  Call back number: [phone]   Reason for call:  Checking on the status of FMLA paperwork mentioned below

## 2017-01-22 ENCOUNTER — Ambulatory Visit: Payer: Managed Care, Other (non HMO)

## 2017-01-22 DIAGNOSIS — R471 Dysarthria and anarthria: Secondary | ICD-10-CM

## 2017-01-22 DIAGNOSIS — R2689 Other abnormalities of gait and mobility: Secondary | ICD-10-CM

## 2017-01-22 DIAGNOSIS — I69015 Cognitive social or emotional deficit following nontraumatic subarachnoid hemorrhage: Secondary | ICD-10-CM

## 2017-01-22 DIAGNOSIS — I69352 Hemiplegia and hemiparesis following cerebral infarction affecting left dominant side: Secondary | ICD-10-CM | POA: Diagnosis not present

## 2017-01-22 NOTE — Telephone Encounter (Signed)
Completed.

## 2017-01-22 NOTE — Therapy (Signed)
[facility] [address] [place], [place], [address] Phone: [phone]   Fax:  [phone]  Physical Therapy Treatment  Patient Details  Name: Abigail Campos MRN: [id number] Date of Birth: [birth year] Referring Provider: Dr. Posey Pronto  Encounter Date: 01/22/2017      PT End of Session - 01/22/17 1546    Visit Number 32   Number of Visits 41   Date for PT Re-Evaluation 01/28/17   Authorization Type [employer]-240 visit limit (including HH and OP). Per Lattie Haw, pt used 24 total HHPT, [place], and [place] visits.   PT Start Time 1447   PT Stop Time 1532   PT Time Calculation (min) 45 min   Equipment Utilized During Treatment --  S for safety   Activity Tolerance Patient tolerated treatment well   Behavior During Therapy WFL for tasks assessed/performed      Past Medical History:  Diagnosis Date  . Essential hypertension 10/19/2007   03-2010: metoprolol changed to bystolic (was not feeling well on it, no specific allergy or reaction)    . Hypertension   . Stroke [facility])     Past Surgical History:  Procedure Laterality Date  . ABDOMINAL HYSTERECTOMY  11-14-07   no oophorectomy per surgical report  . CESAREAN SECTION     [id number]  . INGUINAL HERNIA REPAIR     2004    There were no vitals filed for this visit.      Subjective Assessment - 01/22/17 1455    Subjective Pt denied falls or changes since last visit. Pt reported MD sent pt a letter to pt stating her potassium is low and she needs supplements.    Pertinent History HTN, hyperlipidemia, hx of migraine   Patient Stated Goals I want to walk and stand up without falling.    Currently in Pain? No/denies                         [facility] Adult PT Treatment/Exercise - 01/22/17 1502      Ambulation/Gait   Ambulation/Gait Yes   Ambulation/Gait Assistance 5: Supervision;6: Modified independent (Device/Increase time)  S during last 200' outdoors 2/2  fatigue   Ambulation/Gait Assistance Details Pt amb. over even (indoors) and uneven terrain (outdoors). Cues to improve R step length and B heel strike during last 200' of amb. 2/2 fatigue, and during  enviromental scanning.     Ambulation Distance (Feet) 600 Feet  outdoors and 200' indoors   Assistive device Small based quad cane   Gait Pattern Step-through pattern;Decreased stance time - left;Decreased step length - right;Decreased hip/knee flexion - left;Left genu recurvatum;Left flexed knee in stance;Narrow base of support   Ambulation Surface Level;Unlevel;Indoor;Outdoor;Paved           Self Care:     PT Education - 01/22/17 1544    Education provided Yes   Education Details PT discussed assessing goals and HEP next session. PT discussed likely d/c next week due to progress, and that pt can return to therapy if functional status changes (better or worse). Pt became emotional, and feels she is ready for d/c but is nervous to no longer be in therapy. PT explained that pt has made excellent progress, and needs to continue HEP and walking at home (and not use w/c for mobility, unless for long distances). Pt agreeable.    Person(s) Educated Patient   Methods Explanation   Comprehension Verbalized understanding  PT Short Term Goals - 01/01/17 1410      PT SHORT TERM GOAL #1   Title Pt will be IND in performing progressed HEP to improve strength and balance. TARGET DATE FOR ALL STGS: 12/29/16   Baseline 01/01/17: met with current HEP   Status Achieved     PT SHORT TERM GOAL #2   Title Pt will improve BERG score to >/=48/56 to decr. falls risk.    Baseline 12/31/16: increased to 47/56 (from 45/56), not to goal   Status Partially Met     PT SHORT TERM GOAL #3   Title Pt will amb. 400' over even/uneven terrain with LRAD at MOD I level to improve functional mobility.    Baseline 12/31/16: has met the distance with small based quad cane on level surfaces at supervision    Status Partially Met     PT SHORT TERM GOAL #4   Title Pt will trial [employer] and treadmill and write goals if appropriate to reintegrate pt into community exercises.   Status On-going           PT Long Term Goals - 12/01/16 1452      PT LONG TERM GOAL #1   Title Pt will verablize plans to join fitness center to maintain gains made in PT. TARGET DATE FOR ALL LTGS: 01/26/17   Baseline ALL UNMET LTGS WILL BE CARRIED OVER TO NEW POC: 01/26/17   Status New     PT LONG TERM GOAL #2   Title Pt will improve gait speed to >/=1.51f/sec. with LRAD to reduce falls risk.    Baseline not met 12/01/16: 0.856fsec.   Status On-going     PT LONG TERM GOAL #3   Title Pt will amb. 400' over even/paved surfaces with LRAD at MOD I level to improve functional mobility.    Baseline partially met 12/01/16   Status On-going     PT LONG TERM GOAL #4   Title Pt will perform TUG with LRAD in </=13.5sec. to improve function and decr. falls risk.    Baseline Not met on 12/01/16: 27.3   Status On-going     PT LONG TERM GOAL #6   Title Pt will improve BERG score to >/=51/56 to decr. falls risk.    Baseline 44/56 on 12/01/16   Status On-going               Plan - 01/22/17 1547    Clinical Impression Statement Pt demonstrated progress today, as she amb. longer distances over uneven terrain. Pt did require one standing and one seated rest breaks during and after amb. 2/2 fatigue. Pt continues to demonstrate progress and PT will assess HEP and goals next week and llikely d/c pt.    Rehab Potential Good   Clinical Impairments Affecting Rehab Potential co-morbidities   PT Frequency 2x / week   PT Duration 8 weeks  original POC 2x/week for 8 weeks   PT Treatment/Interventions ADLs/Self Care Home Management;Biofeedback;Canalith Repostioning;Electrical Stimulation;Cognitive remediation;Neuromuscular re-education;Balance training;Therapeutic exercise;Therapeutic activities;Functional mobility training;Stair  training;Gait training;DME Instruction;Orthotic Fit/Training;Patient/family education;Vestibular;Manual techniques   PT Next Visit Plan Check HEP and begin to assess LTGs and d/c.    PT Home Exercise Plan Strengthening HEP.    Consulted and Agree with Plan of Care Patient;Family member/caregiver   Family Member Consulted pt's mom: [name]    Patient will benefit from skilled therapeutic intervention in order to improve the following deficits and impairments:  Decreased endurance, Decreased knowledge of use of DME,  Decreased strength, Impaired UE functional use, Impaired sensation, Decreased balance, Decreased mobility, Decreased range of motion, Decreased coordination, Impaired flexibility, Decreased cognition, Abnormal gait  Visit Diagnosis: Other abnormalities of gait and mobility     Problem List Patient Active Problem List   Diagnosis Date Noted  . Prediabetes 12/29/2016  . Depression 12/29/2016  . Bradycardia, drug induced   . Muscle spasticity   . Obesity (BMI 30.0-34.9) 04/08/2016  . Hyperlipidemia 04/08/2016  . Basal ganglia hemorrhage ([place]) 04/08/2016  . Dysphagia as late effect of cerebrovascular disease   . Migraine with aura and without status migrainosus, not intractable   . Benign essential HTN   . Gait disturbance, post-stroke   . Hemiplegia, post-stroke ([place])   . Dysphagia, post-stroke   . Aphasia, post-stroke   . Bradycardia   . Dysarthria, post-stroke   . ICH (intracerebral hemorrhage) (HCC) - R basal ganglia due to hypertensive emergency 04/01/2016  . Upper airway cough syndrome 12/06/2015  . PCP NOTES >>> 09/25/2015  . Insomnia 12/28/2012  . Annual physical exam 11/23/2011  . Hemorrhoids 10/13/2011  . Morbid obesity ([place]) 02/26/2011  . CHEST PAIN UNSPECIFIED 03/28/2010  . Essential hypertension 10/19/2007    Aleene Swanner L 01/22/2017, 3:49 PM  [facility] [address] [place],  [place], [address] Phone: [phone]   Fax:  [phone]  Name: Abigail Campos MRN: [id number] Date of Birth: 1971/10/05  Geoffry Paradise, PT,DPT 01/22/17 3:49 PM Phone: [phone] Fax: [phone]

## 2017-01-22 NOTE — Patient Instructions (Signed)
  Please complete the assigned speech therapy homework and return it to your next session.  

## 2017-01-22 NOTE — Therapy (Addendum)
[facility] [address] [place], [place], [address] Phone: [phone]   Fax:  [phone]  Speech Language Pathology Treatment  Patient Details  Name: Abigail Campos MRN: [id number] Date of Birth: 1971/10/21 Referring Provider: Delice Lesch, M.D.  Encounter Date: 01/22/2017      End of Session - 01/22/17 1504    Visit Number 25   Number of Visits 29   Date for SLP Re-Evaluation 03/05/17   SLP Start Time 84   SLP Stop Time  1445   SLP Time Calculation (min) 43 min   Activity Tolerance Patient tolerated treatment well      Past Medical History:  Diagnosis Date  . Essential hypertension 10/19/2007   03-2010: metoprolol changed to bystolic (was not feeling well on it, no specific allergy or reaction)    . Hypertension   . Stroke [facility])     Past Surgical History:  Procedure Laterality Date  . ABDOMINAL HYSTERECTOMY  11-14-07   no oophorectomy per surgical report  . CESAREAN SECTION     [id number]  . INGUINAL HERNIA REPAIR     2004    There were no vitals filed for this visit.      Subjective Assessment - 01/22/17 1412    Subjective Pt arrived wihtout homework.   Currently in Pain? No/denies               ADULT SLP TREATMENT - 01/22/17 1415      General Information   Behavior/Cognition Alert;Cooperative;Pleasant mood     Treatment Provided   Treatment provided Cognitive-Linquistic     Pain Assessment   Pain Assessment No/denies pain     Cognitive-Linquistic Treatment   Treatment focused on Cognition   Skilled Treatment Pt with rushes of speech again throughout session. SLP targeted pt attention and awareness today in worksheet with reading and writing. Pt req'd occasional cues for alternating attention (wrote something and then began addressing another question further down on the sheet). Pt req'd min A for problem solving (how to fix clock numbers, hands). Extra time req'd. Pt told SLP  2 deficit areas, and one additional with mod cues from SLP.      Assessment / Recommendations / Plan   Plan Continue with current plan of care     Progression Toward Goals   Progression toward goals Progressing toward goals          SLP Education - 01/22/17 1502    Education provided Yes   Education Details deficit areas   Person(s) Educated Patient   Methods Explanation   Comprehension Verbalized understanding          SLP Short Term Goals - 01/22/17 1624      SLP SHORT TERM GOAL #1   Title pt will name 3 deficit areas independentlly, in two sessions   Baseline goal renewed/modified week of 10-26-16   Time 1   Period Weeks   Status Not Met     SLP SHORT TERM GOAL #2   Title pt will demo selective attention to therapy tasks for 10 minutes in min-mod noisy environment   Status Achieved     SLP SHORT TERM GOAL #3   Title pt will complete tasks of simple cognitive linguistic organization with 90% success and rare min A   Status Achieved     SLP SHORT TERM GOAL #4   Title pt will attempt to self correct misarticulated speech in 50% of opportunities in structured speech tasks  Status Deferred  Pt is functional. Focus on cognitive linguistics          SLP Long Term Goals - 01/22/17 1506      SLP LONG TERM GOAL #1   Title pt will demo alternating attention in simple cognitive linguistic tasks with appropriate switch times, with compensations   Status Achieved     SLP LONG TERM GOAL #2   Title pt will demo emergent awareness in cognitive linguistic tasks by self correcting at least 75% of errors with nonverbal cues over three sessions   Baseline renewed "ongoing" long term goals 01-01-17, for 4 weeks or 8 visits;  01-08-17   Time 2   Period Weeks   Status On-going  and revised     SLP LONG TERM GOAL #3   Title pt will demo functional organization in simple to mod complex linguistic tasks with occasional verbal cues over three sessions   Baseline renewed  01-01-17;    Time 2   Period Weeks   Status On-going  and revised     SLP LONG TERM GOAL #4   Title pt will demo rate of speech in 5 minutes appropriate for 95-99% intelligibility in simple to mod complex conversation with occasional verbal cues   Status Deferred  (01-01-17) pt's speech is functional - focus on cognitive linguistics     SLP LONG TERM GOAL #5   Title pt will demo knowledge of fast speech rate decreasing intelligibility when in conversation and will self correct 90% of the time   Status Deferred  (01-01-17) pt's speech is functional - focus on cognitive linguistics     SLP LONG TERM GOAL #6   Title pt will demo problem solving skills adequate to achieve 85% success in simple/mod complex cognitive linquistic tasks over three sessions   Baseline 01-08-17   Time 2   Period Weeks   Status On-going          Plan - 01/22/17 1505    Clinical Impression Statement Pt continues to require SLP A for error awareness adn attention to detail. Alternating attention and emergent awareness are continued as difficult. All cognitive tasks cont to require extended time. Continue skilld ST to maximize cognition and intelligibility for improved indepedence.   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  Prediabetes 12/29/2016  . Depression 12/29/2016  . Bradycardia, drug induced   . Muscle spasticity   . Obesity (BMI 30.0-34.9) 04/08/2016  . Hyperlipidemia 04/08/2016  . Basal ganglia hemorrhage ([place]) 04/08/2016  . Dysphagia as late effect of cerebrovascular disease   . Migraine with aura and without status migrainosus, not intractable   . Benign essential HTN   . Gait disturbance, post-stroke   . Hemiplegia, post-stroke ([place])   . Dysphagia, post-stroke   . Aphasia, post-stroke   . Bradycardia   . Dysarthria, post-stroke   . ICH (intracerebral hemorrhage) (HCC) - R basal ganglia due to hypertensive  emergency 04/01/2016  . Upper airway cough syndrome 12/06/2015  . PCP NOTES >>> 09/25/2015  . Insomnia 12/28/2012  . Annual physical exam 11/23/2011  . Hemorrhoids 10/13/2011  . Morbid obesity ([place]) 02/26/2011  . CHEST PAIN UNSPECIFIED 03/28/2010  . Essential hypertension 10/19/2007    [facility] ,[place], [place]  01/22/2017, 4:26 PM  [facility] [address] [place], [place], [address] Phone: [phone]   Fax:  [phone]   Name: Abigail Campos MRN: [id number] Date of Birth: 20-Jun-1971

## 2017-01-25 NOTE — Telephone Encounter (Signed)
Received fax confirmation on 01/25/2017 at 0734. Forms sent for scanning.

## 2017-01-25 NOTE — Telephone Encounter (Signed)
Forms faxed to [employer] at [phone].

## 2017-01-26 ENCOUNTER — Encounter: Payer: Self-pay | Admitting: Occupational Therapy

## 2017-01-26 ENCOUNTER — Ambulatory Visit: Payer: Managed Care, Other (non HMO)

## 2017-01-26 ENCOUNTER — Ambulatory Visit: Payer: Managed Care, Other (non HMO) | Admitting: Occupational Therapy

## 2017-01-26 DIAGNOSIS — R278 Other lack of coordination: Secondary | ICD-10-CM

## 2017-01-26 DIAGNOSIS — I69352 Hemiplegia and hemiparesis following cerebral infarction affecting left dominant side: Secondary | ICD-10-CM

## 2017-01-26 DIAGNOSIS — R471 Dysarthria and anarthria: Secondary | ICD-10-CM

## 2017-01-26 DIAGNOSIS — R279 Unspecified lack of coordination: Secondary | ICD-10-CM

## 2017-01-26 DIAGNOSIS — I69015 Cognitive social or emotional deficit following nontraumatic subarachnoid hemorrhage: Secondary | ICD-10-CM

## 2017-01-26 DIAGNOSIS — R2689 Other abnormalities of gait and mobility: Secondary | ICD-10-CM

## 2017-01-26 DIAGNOSIS — R2681 Unsteadiness on feet: Secondary | ICD-10-CM

## 2017-01-26 DIAGNOSIS — I69115 Cognitive social or emotional deficit following nontraumatic intracerebral hemorrhage: Secondary | ICD-10-CM

## 2017-01-26 DIAGNOSIS — M25512 Pain in left shoulder: Secondary | ICD-10-CM

## 2017-01-26 NOTE — Therapy (Signed)
[facility] [address] [place], [place], [address] Phone: [phone]   Fax:  [phone]  Occupational Therapy Treatment  Patient Details  Name: Abigail Campos MRN: [id number] Date of Birth: Nov 23, 1971 Referring Provider: Dr. Posey Pronto  Encounter Date: 01/26/2017      OT End of Session - 01/26/17 1712    Visit Number 29   Number of Visits 35   Date for OT Re-Evaluation 01/31/16   Authorization Type [employer] (240 visit limit combined with home health, estimated HH used 24 combined), no auth required    OT Start Time 1533   OT Stop Time 1615   OT Time Calculation (min) 42 min   Activity Tolerance Patient tolerated treatment well   Behavior During Therapy [facility] for tasks assessed/performed      Past Medical History:  Diagnosis Date  . Essential hypertension 10/19/2007   03-2010: metoprolol changed to bystolic (was not feeling well on it, no specific allergy or reaction)    . Hypertension   . Stroke [facility])     Past Surgical History:  Procedure Laterality Date  . ABDOMINAL HYSTERECTOMY  11-14-07   no oophorectomy per surgical report  . CESAREAN SECTION     [id number]  . INGUINAL HERNIA REPAIR     2004    There were no vitals filed for this visit.      Subjective Assessment - 01/26/17 1701    Subjective  I am able to make coffee, and I did laundry this weekend!   Pertinent History see Epic   Patient Stated Goals to be able to use left arm   Currently in Pain? No/denies   Pain Score 0-No pain                      OT Treatments/Exercises (OP) - 01/26/17 0001      ADLs   Eating Patient is using left hand as a stabilizer when eating a meal - consistently.     Grooming Patient is using her left hand to assist with grroming tasks consistently.     Cooking Patient reports that she is doing some simple cooking tasks at home now.  She reports being able to make a sandwhich, coffee, get snack from  refrigerator, etc.     Home Maintenance Patient very excited to report that she is now doing more of her own laundry, even folding clothes.     ADL Comments Reviewed long term goals, and patient has made significant functional improvement in all aspects of ADL.       Neurological Re-education Exercises   Other Exercises 1 Neuromuscular reeducation to address forced use concept in left upper extremity.  When patient faciltiated to be active from base of support up on left side, she can activate her shoulder and elbow muscles consistently for shoulder extension with elbow flexion, and shoulder flexion (not with elbow extesnion as needed for reach).  Patient able to flex shoulder to 35 degrees when facilitated to be active on left side.  Patient unable to access finger flexion/extension with control today.                  OT Education - 01/26/17 1707    Education provided Yes   Education Details Reviewed all long term goals and progress.  Provided another written handout of exercise program from OT as patient reported losing prior copies.     Person(s) Educated Patient   Methods Explanation;Handout   Comprehension  Verbalized understanding          OT Short Term Goals - 01/26/17 1534      OT SHORT TERM GOAL #1   Title Pt/ caregiver will be I with HEP.   Status Achieved     OT SHORT TERM GOAL #2   Title Pt/ family will verbalize understanding of LUE positioning to minimize pain and risk for injury, including splinting PRN.   Status Achieved     OT SHORT TERM GOAL #3   Title Pt will donn shirt with min A.   Status Achieved     OT SHORT TERM GOAL #4   Title Pt will demonstrate 90* P/ROM shoulder flexion in LUE with pain less than or equal to 3/10.   Status Achieved     OT SHORT TERM GOAL #5   Title Pt will perform simple snack prep modified independently.   Status Achieved     OT SHORT TERM GOAL #6   Title Pt will perform bathing at a supervision level   Status Achieved      OT SHORT TERM GOAL #7   Title Pt will perfom snack prep and light home management with min A .due 01/01/16   Status Achieved           OT Long Term Goals - 01/26/17 1535      OT LONG TERM GOAL #1   Title (P)  Pt will perform LB dressing with min A.   Status (P)  Achieved     OT LONG TERM GOAL #2   Title (P)  Pt will use LUE as a stabilizer/ gross A with min A.   Status (P)  Achieved     OT LONG TERM GOAL #3   Title (P)  Pt will perform simple home management tasks with min A.- now  a short term goal   Status (P)  Achieved     OT LONG TERM GOAL #4   Title (P)  Pt will demonstrate adequate standing balance to retrieve a light weight object from overhead cabinet modified independently with RUE without LOB in prep for IADLS.   Status (P)  Achieved     OT LONG TERM GOAL #5   Title (P)  Pt will perform shower transfers modified independently   Status (P)  Achieved     OT LONG TERM GOAL #6   Title (P)  Pt will use LUE as a gross assist 50 % of the time for ADLs/ functional activity.   Status (P)  Achieved     OT LONG TERM GOAL #8   Title (P)  Pt will demonstrate 30* of left shoulder flexion in prep for functional reach.   Status (P)  Achieved               Plan - 01/26/17 1713    Clinical Impression Statement Patient has met all but one of her long term goals and is ready and agreeable to discharge from OT at this time.  Patient will likely benefit from additional OT in the future to further advance functional independence with ADL/IADL, and improve functional use of her left arm.     Rehab Potential Good   Clinical Impairments Affecting Rehab Potential severity of deficits; cognitive deficits   OT Frequency 2x / week   OT Duration 8 weeks   OT Treatment/Interventions Self-care/ADL training;Moist Heat;Fluidtherapy;DME and/or AE instruction;Splinting;Patient/family education;Balance Financial planner;Contrast Bath;Ultrasound;Therapeutic exercise;Therapeutic  activities;Cognitive remediation/compensation;Passive range of motion;Functional Mobility Training;Neuromuscular education;Cryotherapy;Electrical Stimulation;Parrafin;Energy conservation;Manual Therapy;Visual/perceptual remediation/compensation  Plan discharge from [place] with Plan of Care Patient      Patient will benefit from skilled therapeutic intervention in order to improve the following deficits and impairments:  Abnormal gait, Decreased coordination, Decreased range of motion, Difficulty walking, Impaired flexibility, Impaired sensation, Increased edema, Decreased safety awareness, Decreased endurance, Decreased activity tolerance, Decreased knowledge of precautions, Increased fascial restricitons, Impaired tone, Pain, Impaired UE functional use, Decreased knowledge of use of DME, Decreased balance, Decreased cognition, Decreased mobility, Decreased strength, Impaired perceived functional ability, Impaired vision/preception  Visit Diagnosis: Hemiplegia and hemiparesis following cerebral infarction affecting left dominant side (HCC)  Other lack of coordination  Unsteadiness on feet  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 Prediabetes 12/29/2016  . Depression 12/29/2016  . Bradycardia, drug induced   . Muscle spasticity   . Obesity (BMI 30.0-34.9) 04/08/2016  . Hyperlipidemia 04/08/2016  . Basal ganglia hemorrhage ([place]) 04/08/2016  . Dysphagia as late effect of cerebrovascular disease   . Migraine with aura and without status migrainosus, not intractable   . Benign essential HTN   . Gait disturbance, post-stroke   . Hemiplegia, post-stroke ([place])   . Dysphagia, post-stroke   . Aphasia, post-stroke   . Bradycardia   . Dysarthria, post-stroke   . ICH (intracerebral hemorrhage)  (HCC) - R basal ganglia due to hypertensive emergency 04/01/2016  . Upper airway cough syndrome 12/06/2015  . PCP NOTES >>> 09/25/2015  . Insomnia 12/28/2012  . Annual physical exam 11/23/2011  . Hemorrhoids 10/13/2011  . Morbid obesity ([place]) 02/26/2011  . CHEST PAIN UNSPECIFIED 03/28/2010  . Essential hypertension 10/19/2007   OCCUPATIONAL THERAPY DISCHARGE SUMMARY  Visits from Start of Care: 29  Current functional level related to goals / functional outcomes: Patient requires min assist for ADL at this time, and is incorporating left UE into ADL tasks daily.    Remaining deficits: Hemiplegia, decreased sustained attention, decreased insight,  Decreased sensory awareness of left side, decreased midline orientation, decreased balance   Education / Equipment: Patient has HEP ,and splinting program Plan: Patient agrees to discharge.  Patient goals were partially met. Patient is being discharged due to meeting the stated rehab goals.  ?????      Mariah Milling, OTR/L 01/26/2017, 5:15 PM  [facility] [address] [place], [place], [address] Phone: [phone]   Fax:  [phone]  Name: Abigail Campos MRN: [id number] Date of Birth: 1970/12/22

## 2017-01-26 NOTE — Therapy (Addendum)
[facility] [address] [place], [place], [address] Phone: [phone]   Fax:  [phone]  Physical Therapy Treatment  Patient Details  Name: Abigail Campos MRN: [id number] Date of Birth: 05-03-1971 Referring Provider: Dr. Posey Pronto  Encounter Date: 01/26/2017      PT End of Session - 01/26/17 1646    Visit Number 3   Number of Visits 41   Date for PT Re-Evaluation 01/28/17   Authorization Type [employer]-240 visit limit (including HH and OP). Per Lattie Haw, pt used 24 total HHPT, [place], and [place] visits.   PT Start Time 1450  pt in bathroom   PT Stop Time 1525   PT Time Calculation (min) 35 min   Equipment Utilized During Treatment --  S prn   Activity Tolerance Patient tolerated treatment well   Behavior During Therapy WFL for tasks assessed/performed      Past Medical History:  Diagnosis Date  . Essential hypertension 10/19/2007   03-2010: metoprolol changed to bystolic (was not feeling well on it, no specific allergy or reaction)    . Hypertension   . Stroke [facility])     Past Surgical History:  Procedure Laterality Date  . ABDOMINAL HYSTERECTOMY  11-14-07   no oophorectomy per surgical report  . CESAREAN SECTION     [id number]  . INGUINAL HERNIA REPAIR     2004    There were no vitals filed for this visit.      Subjective Assessment - 01/26/17 1458    Subjective Pt denied falls or changes since last visit. Pt reported she doesn't feel like exercises are easy at this time.    Pertinent History HTN, hyperlipidemia, hx of migraine   Patient Stated Goals I want to walk and stand up without falling.    Currently in Pain? No/denies                         [facility] Adult PT Treatment/Exercise - 01/26/17 1459      Ambulation/Gait   Ambulation/Gait Yes   Ambulation/Gait Assistance 6: Modified independent (Device/Increase time)   Ambulation/Gait Assistance Details Pt amb. over even/uneven terrain  without LOB.   Ambulation Distance (Feet) 400 Feet   Assistive device Small based quad cane   Gait Pattern Step-through pattern;Decreased stance time - left;Decreased step length - right;Decreased hip/knee flexion - left;Left genu recurvatum;Left flexed knee in stance;Narrow base of support   Ambulation Surface Level;Unlevel;Indoor;Outdoor   Gait velocity 0.59f/sec.     Standardized Balance Assessment   Standardized Balance Assessment Timed Up and Go Test;Berg Balance Test     Berg Balance Test   Sit to Stand Able to stand without using hands and stabilize independently   Standing Unsupported Able to stand safely 2 minutes   Sitting with Back Unsupported but Feet Supported on Floor or Stool Able to sit safely and securely 2 minutes   Stand to Sit Sits safely with minimal use of hands   Transfers Able to transfer safely, minor use of hands   Standing Unsupported with Eyes Closed Able to stand 10 seconds safely   Standing Ubsupported with Feet Together Able to place feet together independently and stand 1 minute safely   From Standing, Reach Forward with Outstretched Arm Can reach confidently >25 cm (10")   From Standing Position, Pick up Object from Floor Able to pick up shoe safely and easily   From Standing Position, Turn to Look Behind Over each Shoulder Looks  behind from both sides and weight shifts well   Turn 360 Degrees Able to turn 360 degrees safely but slowly   Standing Unsupported, Alternately Place Feet on Step/Stool Able to complete >2 steps/needs minimal assist   Standing Unsupported, One Foot in Front Able to plae foot ahead of the other independently and hold 30 seconds   Standing on One Leg Able to lift leg independently and hold 5-10 seconds   Total Score 49     Timed Up and Go Test   TUG Normal TUG   Normal TUG (seconds) 29.6  with SBQC                PT Education - 01/26/17 1645    Education provided Yes   Education Details PT discussed goal progress,  outcome measure results and d/c. PT also re-printed pt's PT HEP per pt's request and reviewed HEP-pt stated she did not have questions regarding HEP. Pt reported plans to join [facility].   Person(s) Educated Patient   Methods Explanation   Comprehension Verbalized understanding          PT Short Term Goals - 01/01/17 1410      PT SHORT TERM GOAL #1   Title Pt will be IND in performing progressed HEP to improve strength and balance. TARGET DATE FOR ALL STGS: 12/29/16   Baseline 01/01/17: met with current HEP   Status Achieved     PT SHORT TERM GOAL #2   Title Pt will improve BERG score to >/=48/56 to decr. falls risk.    Baseline 12/31/16: increased to 47/56 (from 45/56), not to goal   Status Partially Met     PT SHORT TERM GOAL #3   Title Pt will amb. 400' over even/uneven terrain with LRAD at MOD I level to improve functional mobility.    Baseline 12/31/16: has met the distance with small based quad cane on level surfaces at supervision   Status Partially Met     PT SHORT TERM GOAL #4   Title Pt will trial [employer] and treadmill and write goals if appropriate to reintegrate pt into community exercises.   Status On-going           PT Long Term Goals - 01/26/17 1647      PT LONG TERM GOAL #1   Title Pt will verablize plans to join fitness center to maintain gains made in PT. TARGET DATE FOR ALL LTGS: 01/26/17   Baseline ALL UNMET LTGS WILL BE CARRIED OVER TO NEW POC: 01/26/17   Status Achieved     PT LONG TERM GOAL #2   Title Pt will improve gait speed to >/=1.54f/sec. with LRAD to reduce falls risk.    Baseline not met 12/01/16: 0.858fsec.   Status Partially Met     PT LONG TERM GOAL #3   Title Pt will amb. 400' over even/paved surfaces with LRAD at MOD I level to improve functional mobility.    Baseline partially met 12/01/16   Status Achieved     PT LONG TERM GOAL #4   Title Pt will perform TUG with LRAD in </=13.5sec. to improve function and decr. falls risk.    Baseline  Not met on 12/01/16: 27.3   Status Not Met     PT LONG TERM GOAL #6   Title Pt will improve BERG score to >/=51/56 to decr. falls risk.    Baseline 44/56 on 12/01/16   Status Achieved  Plan - 01/26/17 1646    Clinical Impression Statement Pt met LTGs 1 and 3, partially met LTGs 2 and 6. Pt did not meet LTG 4. Please see pt d/c summary for details.    Rehab Potential Good   Clinical Impairments Affecting Rehab Potential co-morbidities   PT Frequency 2x / week   PT Duration 8 weeks  original POC 2x/week for 8 weeks   PT Treatment/Interventions ADLs/Self Care Home Management;Biofeedback;Canalith Repostioning;Electrical Stimulation;Cognitive remediation;Neuromuscular re-education;Balance training;Therapeutic exercise;Therapeutic activities;Functional mobility training;Stair training;Gait training;DME Instruction;Orthotic Fit/Training;Patient/family education;Vestibular;Manual techniques   PT Next Visit Plan d/c.   PT Home Exercise Plan Strengthening HEP.    Consulted and Agree with Plan of Care Patient;Family member/caregiver   Family Member Consulted pt's mom: [name]      Patient will benefit from skilled therapeutic intervention in order to improve the following deficits and impairments:  Decreased endurance, Decreased knowledge of use of DME, Decreased strength, Impaired UE functional use, Impaired sensation, Decreased balance, Decreased mobility, Decreased range of motion, Decreased coordination, Impaired flexibility, Decreased cognition, Abnormal gait  Visit Diagnosis: Other abnormalities of gait and mobility     Problem List Patient Active Problem List   Diagnosis Date Noted  . Prediabetes 12/29/2016  . Depression 12/29/2016  . Bradycardia, drug induced   . Muscle spasticity   . Obesity (BMI 30.0-34.9) 04/08/2016  . Hyperlipidemia 04/08/2016  . Basal ganglia hemorrhage ([place]) 04/08/2016  . Dysphagia as late effect of cerebrovascular disease   . Migraine  with aura and without status migrainosus, not intractable   . Benign essential HTN   . Gait disturbance, post-stroke   . Hemiplegia, post-stroke ([place])   . Dysphagia, post-stroke   . Aphasia, post-stroke   . Bradycardia   . Dysarthria, post-stroke   . ICH (intracerebral hemorrhage) (HCC) - R basal ganglia due to hypertensive emergency 04/01/2016  . Upper airway cough syndrome 12/06/2015  . PCP NOTES >>> 09/25/2015  . Insomnia 12/28/2012  . Annual physical exam 11/23/2011  . Hemorrhoids 10/13/2011  . Morbid obesity ([place]) 02/26/2011  . CHEST PAIN UNSPECIFIED 03/28/2010  . Essential hypertension 10/19/2007    Abigail Campos 01/26/2017, 4:48 PM  [facility] [address] [place], [place], [address] Phone: [phone]   Fax:  [phone]  Name: Abigail Campos MRN: [id number] Date of Birth: 1971/04/14  PHYSICAL THERAPY DISCHARGE SUMMARY  Visits from Start of Care: 33  Current functional level related to goals / functional outcomes:     PT Long Term Goals - 01/26/17 1647      PT LONG TERM GOAL #1   Title Pt will verablize plans to join fitness center to maintain gains made in PT. TARGET DATE FOR ALL LTGS: 01/26/17   Baseline ALL UNMET LTGS WILL BE CARRIED OVER TO NEW POC: 01/26/17   Status Achieved     PT LONG TERM GOAL #2   Title Pt will improve gait speed to >/=1.31f/sec. with LRAD to reduce falls risk.    Baseline not met 12/01/16: 0.811fsec.   Status Partially Met     PT LONG TERM GOAL #3   Title Pt will amb. 400' over even/paved surfaces with LRAD at MOD I level to improve functional mobility.    Baseline partially met 12/01/16   Status Achieved     PT LONG TERM GOAL #4   Title Pt will perform TUG with LRAD in </=13.5sec. to improve function and decr. falls risk.    Baseline Not met on 12/01/16: 27.3  Status Not Met     PT LONG TERM GOAL #6   Title Pt will improve BERG score to >/=51/56 to  decr. falls risk.    Baseline 44/56 on 12/01/16   Status Achieved        Remaining deficits: Campos sided weakness and balance impairments. Pt has achieved maximal functional potential at this time.   Education / Equipment: HEP and join gym.  Plan: Patient agrees to discharge.  Patient goals were partially met. Patient is being discharged due to being pleased with the current functional level.  ?????       Geoffry Paradise, PT,DPT 01/26/17 4:48 PM Phone: [phone] Fax: [phone]

## 2017-01-26 NOTE — Patient Instructions (Signed)
Functional Quadriceps: Sit to Stand    Have someone there with you for safety, try not to hold onto chair or arm rest.  Have a sturdy chair in front of you and place a 2 inch book under right foot, in order to promote weight shifting onto Left leg. Sit on edge of chair, feet flat on floor. Stand upright, extending knees fully. Repeat __10__ times per set. Do __1__ sets per session. Do __1__ sessions per day. http://orth.exer.us/735  Copyright  VHI. All rights reserved.  Bridge    Bring belly button to spine. Lie back, legs bent. Inhale, pressing hips up. Keeping ribs in, lengthen lower back, then brings hips back to mat/bed. Make sure to keep your left leg from going out to the side. Repeat _10___ times. Do __3__ sessions per day.  http://pm.exer.us/55 STOP THIS ONE AND PERFORM WITH LEGS ON BALL, SEE BELOW.  Copyright  VHI. All rights reserved.  Bridge    Lift hips, keeping pelvis level. Do _10__ times, _3__ times per session. Perform 3-4 days per week.  http://ss.exer.us/365   Copyright  VHI. All rights reserved.    Straight Leg Raise    Tighten stomach and slowly raise locked left leg __6__ inches from floor. Repeat __10__ times per set. Do __3__ sets per session. Do __1__ session. Perform 3-4 days per week. http://orth.exer.us/1103  Copyright  VHI. All rights reserved.  Toe / Heel Raise (Sitting)    Sitting, raise heels, then rock back on heels and raise toes. Repeat __20__ times. Perform every day.  Copyright  VHI. All rights reserved.  ABDUCTION: Sitting (Active)    Sit with feet flat. Lift left leg slightly and draw it out to side. Then return to center. Complete _3__ sets of _10__ repetitions. Perform __1_ session. Perform 3-4 days per week.   Copyright  VHI. All rights reserved.  Heel Slide    Perform in chair, seated, with sock on left foot, to slide on floor. Bend left knee and pull heel toward buttocks. Repeat __10__  times. Do __1__ sessions per day.  http://gt2.exer.us/301   Copyright  VHI. All rights reserved.

## 2017-01-27 NOTE — Therapy (Addendum)
[facility] [address] [place], [place], [address] Phone: [phone]   Fax:  [phone]  Speech Language Pathology Treatment  Patient Details  Name: Abigail Campos MRN: [id number] Date of Birth: 07/07/1971 Referring Provider: Delice Lesch, M.D.  Encounter Date: 01/26/2017      End of Session - 01/27/17 1100    Visit Number 26   Number of Visits 29   Date for SLP Re-Evaluation 03/05/17   SLP Start Time 5638   SLP Stop Time  1445   SLP Time Calculation (min) 42 min   Activity Tolerance Patient tolerated treatment well      Past Medical History:  Diagnosis Date  . Essential hypertension 10/19/2007   03-2010: metoprolol changed to bystolic (was not feeling well on it, no specific allergy or reaction)    . Hypertension   . Stroke [facility])     Past Surgical History:  Procedure Laterality Date  . ABDOMINAL HYSTERECTOMY  11-14-07   no oophorectomy per surgical report  . CESAREAN SECTION     [id number]  . INGUINAL HERNIA REPAIR     2004    There were no vitals filed for this visit.      Subjective Assessment - 01/26/17 1416    Subjective "Really, I thought my last one was Thursday (pt, re: PT/OT)."    Currently in Pain? No/denies               ADULT SLP TREATMENT - 01/27/17 0001      General Information   Behavior/Cognition Alert;Cooperative;Pleasant mood;Distractible     Treatment Provided   Treatment provided Cognitive-Linquistic     Pain Assessment   Pain Assessment No/denies pain     Cognitive-Linquistic Treatment   Treatment focused on Cognition   Skilled Treatment Pt with rushes of speech throughout session, awareness <25% (did not self correct >25% of the time). SLP again targeted pt attention and awareness today in worksheet with reading and writing. Pt req'd rare cues for alternating attention with simple cognitive tasks, occasional min cues needed for alternating attention in  multi-process task. Extra time req'd. Pt thinks that it is OK for today to be d/c day, as she only has ST on Thursday.     Assessment / Recommendations / Plan   Plan Continue with current plan of care;Discharge SLP treatment due to (comment)  met rehab potential     Progression Toward Goals   Progression toward goals --  see goal update            SLP Short Term Goals - 01/22/17 1624      SLP SHORT TERM GOAL #1   Title pt will name 3 deficit areas independentlly, in two sessions   Baseline goal renewed/modified week of 10-26-16   Time 1   Period Weeks   Status Not Met     SLP SHORT TERM GOAL #2   Title pt will demo selective attention to therapy tasks for 10 minutes in min-mod noisy environment   Status Achieved     SLP SHORT TERM GOAL #3   Title pt will complete tasks of simple cognitive linguistic organization with 90% success and rare min A   Status Achieved     SLP SHORT TERM GOAL #4   Title pt will attempt to self correct misarticulated speech in 50% of opportunities in structured speech tasks   Status Deferred  Pt is functional. Focus on cognitive linguistics  SLP Long Term Goals - 01/26/17 1417      SLP LONG TERM GOAL #1   Title (P)  pt will demo alternating attention in simple cognitive linguistic tasks with appropriate switch times, with compensations   Status (P)  Achieved     SLP LONG TERM GOAL #2   Title (P)  pt will demo emergent awareness in cognitive linguistic tasks by self correcting at least 75% of errors with nonverbal cues over three sessions   Baseline (P)  renewed "ongoing" long term goals 01-01-17, for 4 weeks or 8 visits;  01-08-17   Time    Period    Status Not met     SLP LONG TERM GOAL #3   Title (P)  pt will demo functional organization in simple to mod complex linguistic tasks with occasional verbal cues over three sessions   Baseline    Time    Period    Status  Not Met       SLP LONG TERM GOAL #4   Title (P)  pt will  demo rate of speech in 5 minutes appropriate for 95-99% intelligibility in simple to mod complex conversation with occasional verbal cues   Status (P)  Deferred  (01-01-17) pt's speech is functional - focus on cognitive linguistics     SLP LONG TERM GOAL #5   Title (P)  pt will demo knowledge of fast speech rate decreasing intelligibility when in conversation and will self correct 90% of the time   Status (P)  Deferred  (01-01-17) pt's speech is functional - focus on cognitive linguistics     SLP LONG TERM GOAL #6   Title (P)  pt will demo problem solving skills adequate to achieve 85% success in simple/mod complex cognitive linquistic tasks over three sessions   Baseline    Time    Period    Status (P)  Not Met          Plan - 01/27/17 1101    Clinical Impression Statement Pt continues to require SLP A for error awareness adn attention to detail. Alternating attention and emergent awareness are continued as difficult in tasks > simple level. All cognitive tasks cont to require extended time. Pt agrees to d/c at this time and was provided a packet of tasks to work with at home targeting awareness, attention to detail, reasoning, and attention skills.   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 Her dysarthria remains unchanged however SLP believes this is mainly due to pt's decr'd emergent awareness skills.   Remaining deficits: Dysarthria, as mentioned above, higher level cognitive linguistic skills.   Education / Equipment: Compensations for cognitive linguistic skills.   Plan: Patient agrees to discharge.  Patient goals were partially met. Patient is being discharged due to                                                     ?????  meeting her current rehab potential. If pt desires to return to school, a neurocognitive eval may be warranted to assess her readiness to accomplish this task.       Problem List Patient Active Problem List   Diagnosis Date Noted  . Prediabetes 12/29/2016  . Depression 12/29/2016  . Bradycardia, drug induced   . Muscle spasticity   . Obesity (BMI 30.0-34.9) 04/08/2016  . Hyperlipidemia 04/08/2016  . Basal ganglia hemorrhage ([place]) 04/08/2016  . Dysphagia as late effect of cerebrovascular disease   . Migraine with aura and without status migrainosus, not intractable   . Benign essential HTN   . Gait disturbance, post-stroke   . Hemiplegia, post-stroke ([place])   . Dysphagia, post-stroke   . Aphasia, post-stroke   . Bradycardia   . Dysarthria, post-stroke   . ICH (intracerebral hemorrhage) (HCC) - R basal ganglia due to hypertensive emergency 04/01/2016  . Upper airway cough syndrome 12/06/2015  . PCP NOTES >>> 09/25/2015  . Insomnia 12/28/2012  . Annual physical exam 11/23/2011  . Hemorrhoids 10/13/2011  . Morbid obesity ([place]) 02/26/2011  . CHEST PAIN UNSPECIFIED 03/28/2010  . Essential hypertension 10/19/2007    [facility] ,[place], [place]   01/27/2017, 11:03 AM  [facility] [address] [place], [place], [address] Phone: [phone]   Fax:  [phone]   Name: Abigail Campos MRN: [id number] Date of Birth: 1971/06/30

## 2017-01-27 NOTE — Patient Instructions (Signed)
Complete your packet of ST tasks at home.

## 2017-01-28 NOTE — Telephone Encounter (Signed)
Caller name: Pasquariello,Jay Relation to [id number]  Call back number: [phone]    Reason for call:  Part B question 6 missing please note MD signature near correction and fax entire FMLA paperwork back to [place].

## 2017-01-29 NOTE — Telephone Encounter (Signed)
Spouse informed.

## 2017-01-29 NOTE — Telephone Encounter (Signed)
FMLA paperwork has been sent to off site scanning center. Waiting for forms to be scanned to chart.

## 2017-01-29 NOTE — Telephone Encounter (Signed)
Please inform Pt's husband Jaytonius that question on form has been completed and re-faxed. Thank you.

## 2017-02-09 ENCOUNTER — Other Ambulatory Visit: Payer: Self-pay | Admitting: Physical Medicine & Rehabilitation

## 2017-02-09 DIAGNOSIS — I610 Nontraumatic intracerebral hemorrhage in hemisphere, subcortical: Secondary | ICD-10-CM

## 2017-02-13 ENCOUNTER — Emergency Department (HOSPITAL_BASED_OUTPATIENT_CLINIC_OR_DEPARTMENT_OTHER): Payer: Managed Care, Other (non HMO)

## 2017-02-13 ENCOUNTER — Encounter (HOSPITAL_BASED_OUTPATIENT_CLINIC_OR_DEPARTMENT_OTHER): Payer: Self-pay | Admitting: Adult Health

## 2017-02-13 ENCOUNTER — Emergency Department (HOSPITAL_BASED_OUTPATIENT_CLINIC_OR_DEPARTMENT_OTHER)
Admission: EM | Admit: 2017-02-13 | Discharge: 2017-02-14 | Disposition: A | Discharge status: Home / Self Care | Payer: Managed Care, Other (non HMO) | Attending: Emergency Medicine | Admitting: Emergency Medicine

## 2017-02-13 DIAGNOSIS — Y9289 Other specified places as the place of occurrence of the external cause: Secondary | ICD-10-CM | POA: Diagnosis not present

## 2017-02-13 DIAGNOSIS — Y999 Unspecified external cause status: Secondary | ICD-10-CM | POA: Insufficient documentation

## 2017-02-13 DIAGNOSIS — W109XXA Fall (on) (from) unspecified stairs and steps, initial encounter: Secondary | ICD-10-CM | POA: Diagnosis not present

## 2017-02-13 DIAGNOSIS — S60415A Abrasion of left ring finger, initial encounter: Secondary | ICD-10-CM | POA: Diagnosis not present

## 2017-02-13 DIAGNOSIS — S60417A Abrasion of left little finger, initial encounter: Secondary | ICD-10-CM | POA: Diagnosis not present

## 2017-02-13 DIAGNOSIS — Z79899 Other long term (current) drug therapy: Secondary | ICD-10-CM | POA: Insufficient documentation

## 2017-02-13 DIAGNOSIS — R51 Headache: Secondary | ICD-10-CM | POA: Insufficient documentation

## 2017-02-13 DIAGNOSIS — R519 Headache, unspecified: Secondary | ICD-10-CM

## 2017-02-13 DIAGNOSIS — W19XXXA Unspecified fall, initial encounter: Secondary | ICD-10-CM

## 2017-02-13 DIAGNOSIS — I10 Essential (primary) hypertension: Secondary | ICD-10-CM | POA: Insufficient documentation

## 2017-02-13 DIAGNOSIS — Y9301 Activity, walking, marching and hiking: Secondary | ICD-10-CM | POA: Diagnosis not present

## 2017-02-13 DIAGNOSIS — R110 Nausea: Secondary | ICD-10-CM | POA: Insufficient documentation

## 2017-02-13 DIAGNOSIS — S6992XA Unspecified injury of left wrist, hand and finger(s), initial encounter: Secondary | ICD-10-CM | POA: Diagnosis present

## 2017-02-13 IMAGING — CT CT HEAD W/O CM
3 series · 15 of 46 positions shown, 18 images · non-contrast
Comparison: CT of the head performed [DATE]

CLINICAL DATA: Acute onset of left-sided deficits. Status post
fall, landing on left side of head on concrete. Initial encounter.

EXAM:
CT HEAD WITHOUT CONTRAST
TECHNIQUE: Contiguous axial images were obtained from the base of the skull
through the vertex without intravenous contrast.

[Series 2: head wo · axial · 0.44mm/px · z∈[+179,+299]mm · 9 of 29 slices shown, 12 images]
[im 3/29  brain]
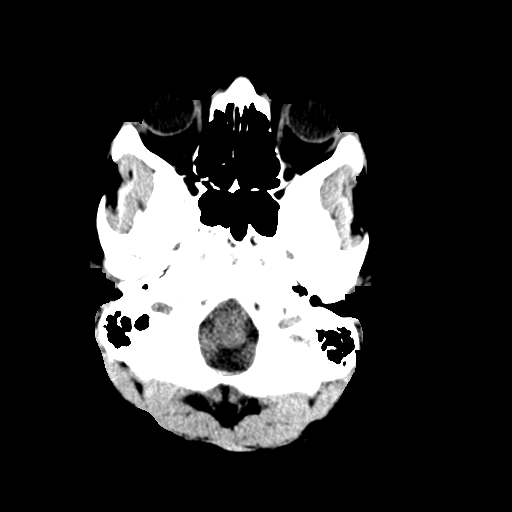
[im 3/29  bone]
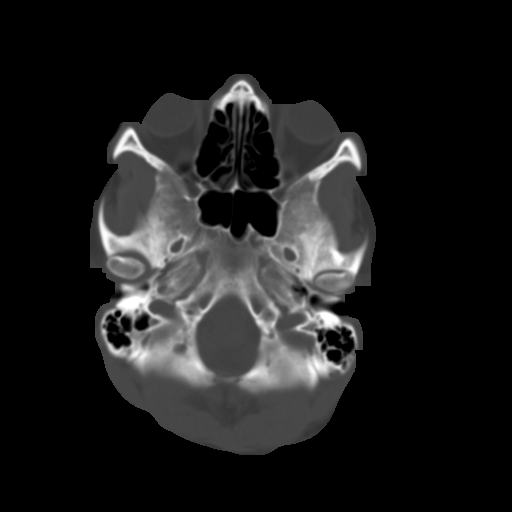
[im 6/29  brain]
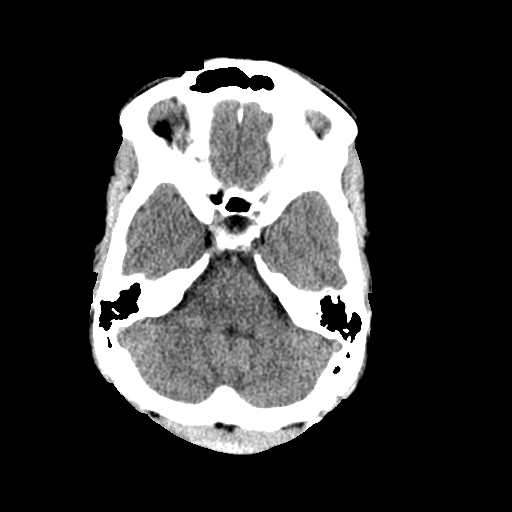
[im 9/29  brain]
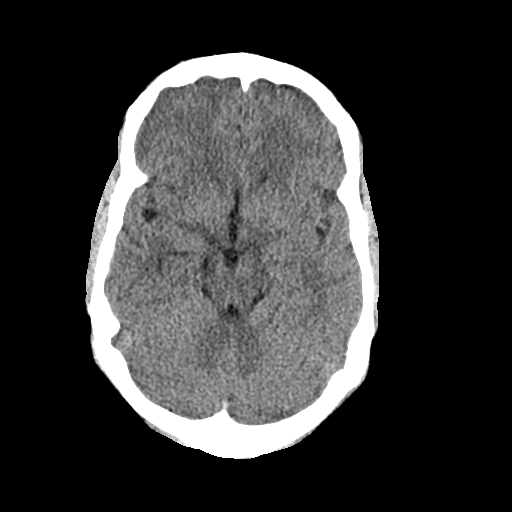
[im 12/29  brain]
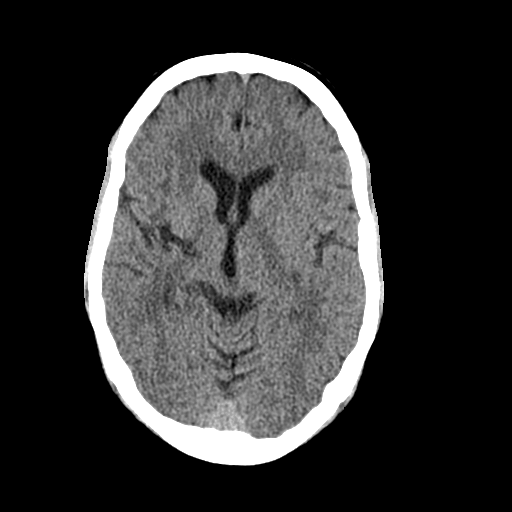
[im 15/29  brain]
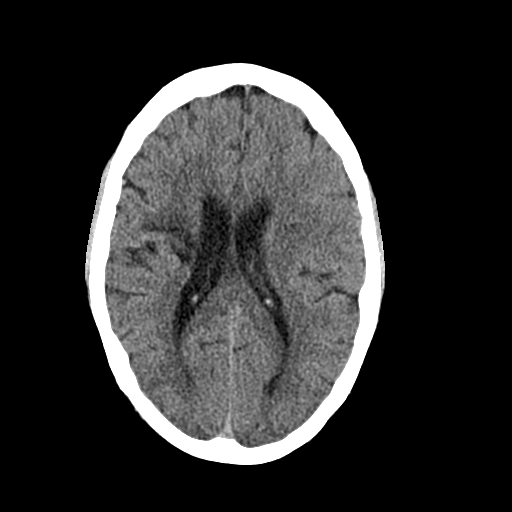
[im 15/29  bone]
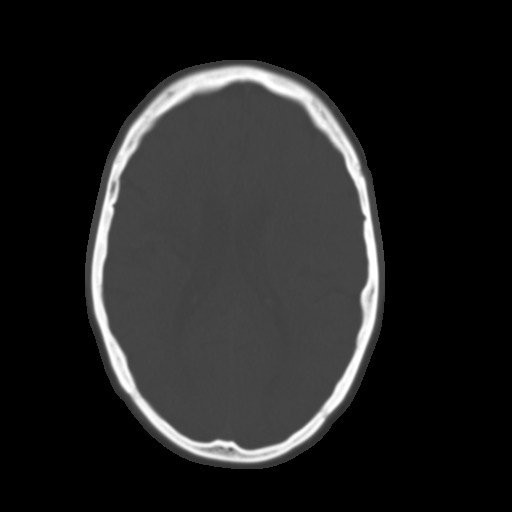
[im 18/29  brain]
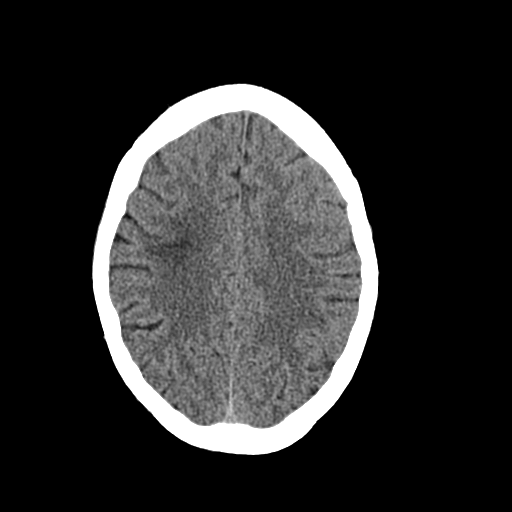
[im 21/29  brain]
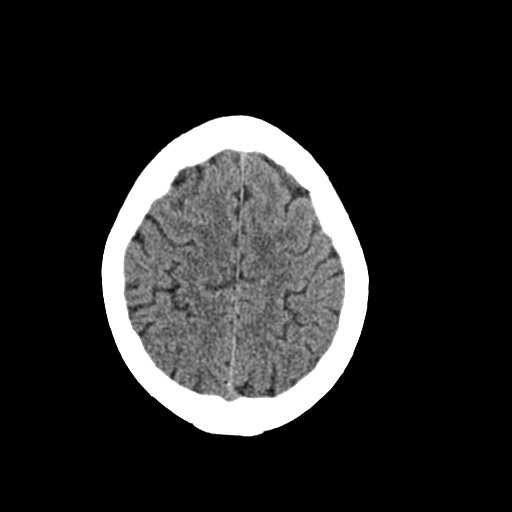
[im 24/29  brain]
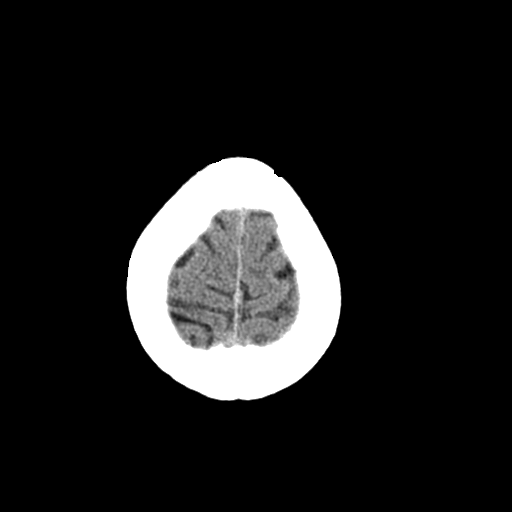
[im 27/29  brain]
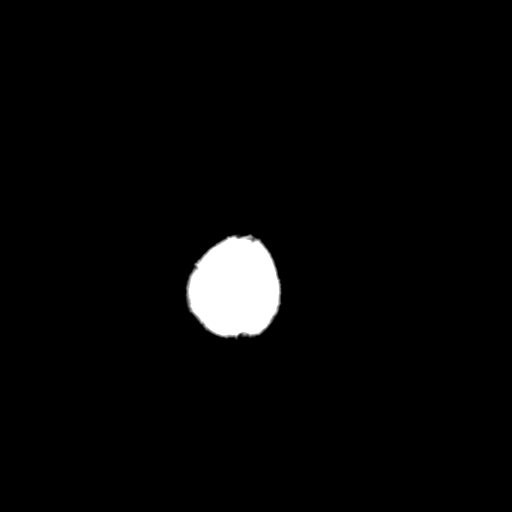
[im 27/29  bone]
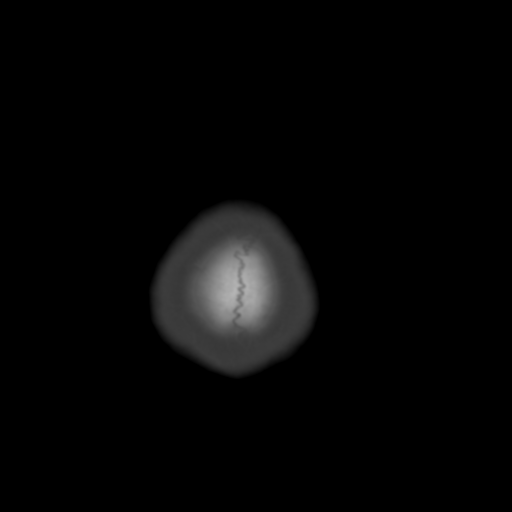

[Series 4: cor soft · coronal · 0.29mm/px · 3 of 70 slices shown]
[im 24/70  brain]
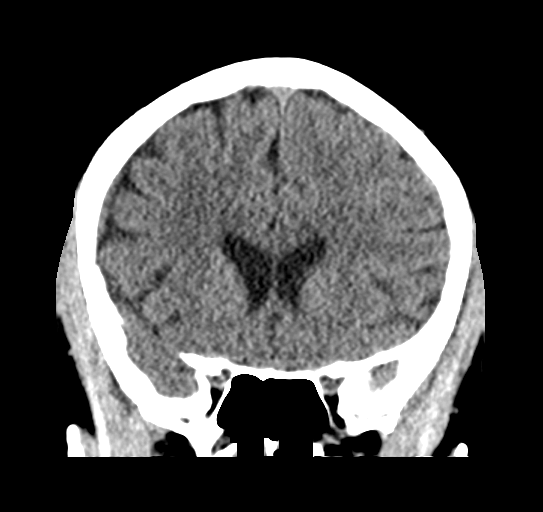
[im 31/70  brain]
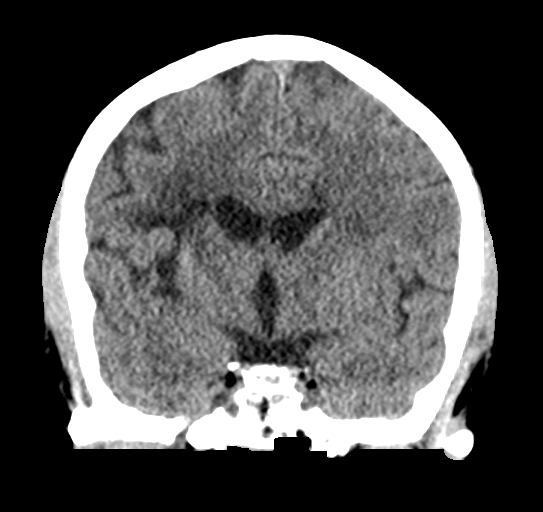
[im 39/70  brain]
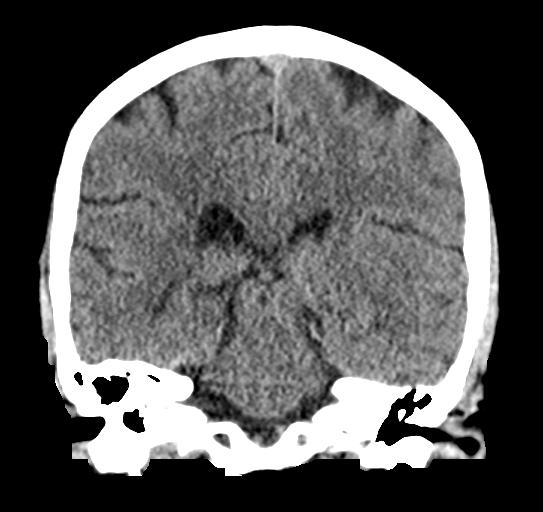

[Series 5: sag soft · sagittal · 0.30mm/px · 3 of 52 slices shown]
[im 18/52  brain]
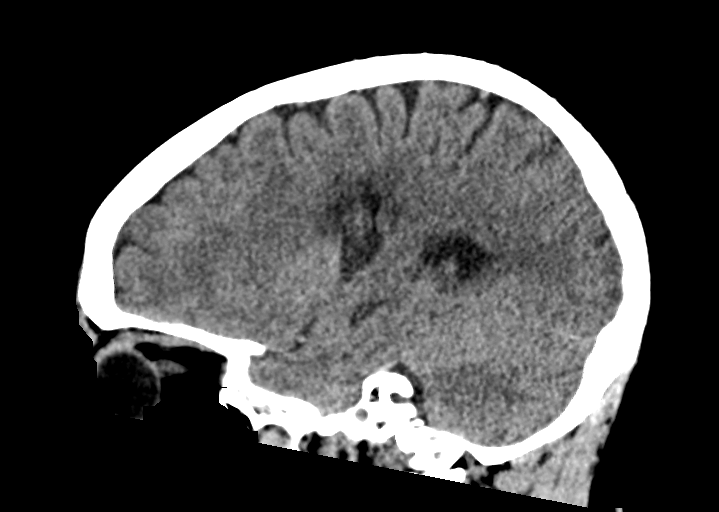
[im 26/52  brain]
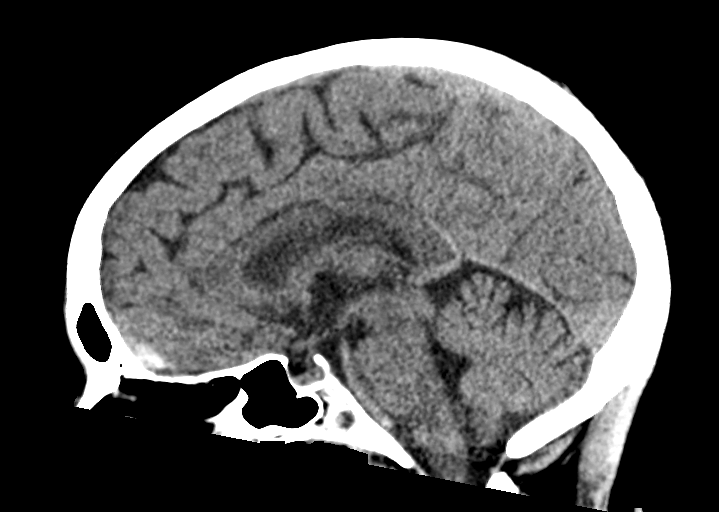
[im 35/52  brain]
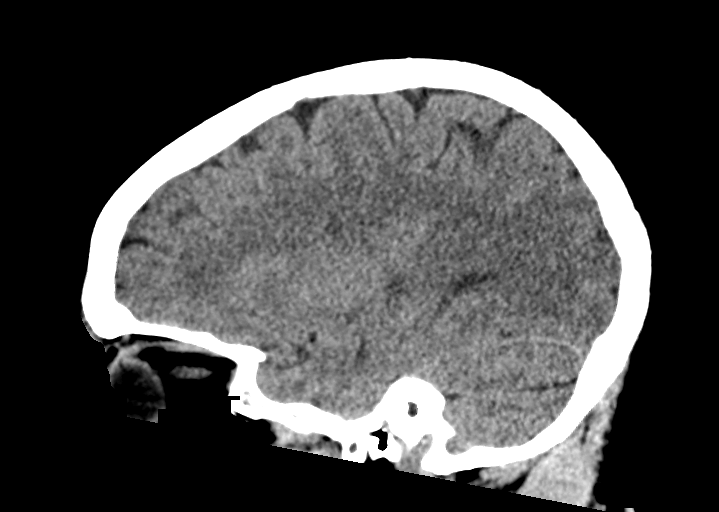

[15 of 46 positions shown; findings below may reference images not displayed]

FINDINGS: Brain: No evidence of acute infarction, hemorrhage, hydrocephalus,
extra-axial collection or mass lesion/mass effect.

Chronic encephalomalacia is noted at the right basal ganglia and
right parietal lobe, reflecting prior hemorrhage. This is stable in
appearance from ESTIE.

The brainstem and fourth ventricle are within normal limits. No mass
effect or midline shift is seen.

Vascular: No hyperdense vessel or unexpected calcification.

Skull: There is no evidence of fracture; visualized osseous
structures are unremarkable in appearance.

Sinuses/Orbits: The visualized portions of the orbits are within
normal limits. The paranasal sinuses and mastoid air cells are
well-aerated.

Other: No significant soft tissue abnormalities are seen.
IMPRESSION: 1. No evidence of traumatic intracranial injury or fracture.
2. Chronic encephalomalacia at the right basal ganglia and right
parietal lobe, reflecting prior hemorrhage. Stable appearance from
prior studies.

## 2017-02-13 MED ORDER — ACETAMINOPHEN 325 MG PO TABS
650.0000 mg | ORAL_TABLET | Freq: Once | ORAL | Status: AC
  Administered 2017-02-13: 650 mg via ORAL
  Filled 2017-02-13: qty 2

## 2017-02-13 NOTE — ED Notes (Signed)
Patient transported to CT 

## 2017-02-13 NOTE — ED Provider Notes (Signed)
[facility] DEPT [facility] Provider Note   [name]: [id number] Arrival date & time: 02/13/17  2028   By signing my name below, I, Eunice Blase, attest that this documentation has been prepared under the direction and in the presence of Kalifa Cadden. Electronically Signed: Eunice Blase, Scribe. 02/13/17. 10:17 PM.   History   Chief Complaint Chief Complaint  Patient presents with  . Fall   The history is provided by the patient and medical records. No language interpreter was used.    HPI Comments: Abigail Campos is a 46 y.o. female with Hx of migraines, stroke and related left sided neural deficits who presents to the Emergency Department complaining of headache following a fall that occurred ~1630 today. Pt describes the headache as coming and going and unchanged. She is ambulatory with a walker. Pt reports associated nausea. She states she fell on concrete after she lost footing on the left foot going up a flight of stairs. Her husband believes she has new shoes that are slippery and she may have lost footing with her walker. She notes she hit the back of of her head without LOC and her left upper extremity. She states that she "must have hit her head" since she has a headache. Her husband reports he did not witness head trauma. She adds she has not taken any medications at home. Pt on plavix at home. She is followed by Dr. Posey Pronto, last ~12/2016, for neurology. She adds she recently finished physical therapy ~01/2017. Pt denies visual changes, gait changes, new weakness, new numbness, chest pain, SOB, vomiting or diarrhea.   Past Medical History:  Diagnosis Date  . Essential hypertension 10/19/2007   03-2010: metoprolol changed to bystolic (was not feeling well on it, no specific allergy or reaction)    . Hypertension   . Stroke [facility])     Patient Active Problem List   Diagnosis Date Noted  . Prediabetes 12/29/2016  . Depression 12/29/2016  . Bradycardia, drug induced   . Muscle  spasticity   . Obesity (BMI 30.0-34.9) 04/08/2016  . Hyperlipidemia 04/08/2016  . Basal ganglia hemorrhage ([place]) 04/08/2016  . Dysphagia as late effect of cerebrovascular disease   . Migraine with aura and without status migrainosus, not intractable   . Benign essential HTN   . Gait disturbance, post-stroke   . Hemiplegia, post-stroke ([place])   . Dysphagia, post-stroke   . Aphasia, post-stroke   . Bradycardia   . Dysarthria, post-stroke   . ICH (intracerebral hemorrhage) (HCC) - R basal ganglia due to hypertensive emergency 04/01/2016  . Upper airway cough syndrome 12/06/2015  . PCP NOTES >>> 09/25/2015  . Insomnia 12/28/2012  . Annual physical exam 11/23/2011  . Hemorrhoids 10/13/2011  . Morbid obesity ([place]) 02/26/2011  . CHEST PAIN UNSPECIFIED 03/28/2010  . Essential hypertension 10/19/2007    Past Surgical History:  Procedure Laterality Date  . ABDOMINAL HYSTERECTOMY  11-14-07   no oophorectomy per surgical report  . CESAREAN SECTION     [id number]  . INGUINAL HERNIA REPAIR     2004    OB History    Gravida Para Term Preterm AB Living   2 2           SAB TAB Ectopic Multiple Live Births                   Home Medications    Prior to Admission medications   Medication Sig Start Date End Date Taking? Authorizing Provider  atorvastatin (LIPITOR) 20 MG  tablet Take 1 tablet (20 mg total) by mouth daily at 6 PM. 10/12/16   Colon Branch, MD  baclofen (LIORESAL) 10 MG tablet TAKE 1 TABLET(10 MG) BY MOUTH THREE TIMES DAILY 02/09/17   Ankit [name], MD  butalbital-acetaminophen-caffeine (FIORICET, ESGIC) 50-325-40 MG tablet Take 1 tablet by mouth every 4 (four) hours as needed for headache. Patient not taking: Reported on 12/28/2016 05/08/16   Lavon Paganini Angiulli, PA-C  Cholecalciferol (VITAMIN D PO) Take 1 tablet by mouth daily.     Historical Provider, MD  clopidogrel (PLAVIX) 75 MG tablet Take 1 tablet (75 mg total) by mouth daily. 06/24/16   Rosalin Hawking, MD  FLUoxetine  (PROZAC) 20 MG tablet Take 2 tablets (40 mg total) by mouth daily. 12/28/16   Colon Branch, MD  furosemide (LASIX) 20 MG tablet Take 1 tablet (20 mg total) by mouth daily. 01/15/17   Colon Branch, MD  lisinopril (PRINIVIL,ZESTRIL) 2.5 MG tablet Take 1 tablet (2.5 mg total) by mouth daily. 10/07/16   Colon Branch, MD  nebivolol (BYSTOLIC) 5 MG tablet Take 1 tablet (5 mg total) by mouth every 12 (twelve) hours. 10/12/16   Colon Branch, MD  pantoprazole (PROTONIX) 40 MG tablet Take 40 mg by mouth daily.    Historical Provider, MD    Family History Family History  Problem Relation Age of Onset  . Diabetes Mother   . Hypertension Mother   . Glaucoma Mother   . Colon cancer Mother     mother age 55  . Stroke Paternal Grandfather   . Heart disease      2 uncles died March 16, 2011 from CAD-CHF  . Stroke Maternal Grandfather   . Diabetes Other     siblings  . Hypertension Other     siblings    Social History Social History  Substance Use Topics  . Smoking status: Never Smoker  . Smokeless tobacco: Never Used  . Alcohol use No     Allergies   Bee venom; Peanut-containing drug products; Hydrocodone; Isovue [iopamidol]; Ambien [zolpidem tartrate]; Aspirin; and Latex   Review of Systems Review of Systems  Eyes: Negative for visual disturbance.  Respiratory: Negative for shortness of breath.   Cardiovascular: Negative for chest pain.  Gastrointestinal: Positive for nausea. Negative for abdominal pain, diarrhea and vomiting.  Musculoskeletal: Negative for gait problem.  Neurological: Positive for headaches. Negative for syncope, weakness and numbness.  All other systems reviewed and are negative.    Physical Exam Updated Vital Signs BP (!) 169/105   Pulse 63   Temp 98.8 F (37.1 C) (Oral)   Resp 18   SpO2 98%   Physical Exam  Constitutional: She is oriented to person, place, and time. She appears well-developed and well-nourished. No distress.  Well-appearing. Has known left-sided weakness  from a previous stroke.  HENT:  Head: Normocephalic and atraumatic.  Nose: Nose normal.  Mouth/Throat: Oropharynx is clear and moist.  Head and scalp without evidence of wound, redness, swelling, deformity or tenderness to palpation.  Eyes: Conjunctivae and EOM are normal. Pupils are equal, round, and reactive to light.  Neck: Normal range of motion. Neck supple.  Good range of motion of the neck. No nuchal rigidity noted.  Cardiovascular: Normal rate, regular rhythm and normal heart sounds.  Exam reveals no gallop and no friction rub.   No murmur heard. Pulmonary/Chest: Effort normal and breath sounds normal. No respiratory distress. She has no wheezes. She has no rales.  Abdominal: Soft. She  exhibits no distension. There is no tenderness. There is no rebound and no guarding.  Soft and nontender. No rebound tenderness or guarding.  Musculoskeletal: She exhibits no tenderness.  No neck tenderness. No midline cervical, thoracic, lumbar spine tenderness.  Neurological: She is alert and oriented to person, place, and time.  Cranial Nerves:  III,IV, VI: ptosis not present, extra-ocular movements intact bilaterally, direct and consensual pupillary light reflexes intact bilaterally V: facial sensation less on LEFT side. Jaw opening, and bite strength equal bilaterally VII: eyebrow raise, eyelid close, smile, frown, pucker less on LEFT side.  VIII: hearing grossly normal bilaterally  IX,X: palate elevation and swallowing intact XI: bilateral shoulder shrug less on LEFT side.  Lateral head rotation equal and strong XII: midline tongue extension  Negative pronator drift on right side, negative RAM's on right side, negative heel-to-shin on right side, negative finger to nose on right side. LEFT side not able to test due to left sided weakness from previous stroke.    Sensory intact to right side. Sensory abnormal to left side, upper and lower extremities.   Muscle strength 5/5 to right side and 4/5  on LEFT side to left lower extremity. Muscle strength left upper extremity 0/5.  Patient able to ambulate without difficulty.   Skin: Skin is warm and dry. She is not diaphoretic.  About 1 mm Abrasions noted to the L 4th and 5th fingers over the proximal interphalangeal joint with no active bleeding.  No other signs of wounds, redness, swelling, tenderness of head, neck, pelvis, knees, ankles, shoulders, elbows, hands.  Psychiatric: She has a normal mood and affect. Her behavior is normal.  Nursing note and vitals reviewed.    ED Treatments / Results  DIAGNOSTIC STUDIES: Oxygen Saturation is 98% on RA, normal by my interpretation.    COORDINATION OF CARE: 10:11 PM Discussed treatment plan with pt at bedside and pt agreed to plan. Will order labs, medications and imaging. Will then reassess.  Labs (all labs ordered are listed, but only abnormal results are displayed) Labs Reviewed - No data to display  EKG  EKG Interpretation None       Radiology Ct Head Wo Contrast  Result Date: 02/13/2017 CLINICAL DATA:  Acute onset of left-sided deficits. Status post fall, landing on left side of head on concrete. Initial encounter. EXAM: CT HEAD WITHOUT CONTRAST TECHNIQUE: Contiguous axial images were obtained from the base of the skull through the vertex without intravenous contrast. COMPARISON:  CT of the head performed 10/07/2016 FINDINGS: Brain: No evidence of acute infarction, hemorrhage, hydrocephalus, extra-axial collection or mass lesion/mass effect. Chronic encephalomalacia is noted at the right basal ganglia and right parietal lobe, reflecting prior hemorrhage. This is stable in appearance from October. The brainstem and fourth ventricle are within normal limits. No mass effect or midline shift is seen. Vascular: No hyperdense vessel or unexpected calcification. Skull: There is no evidence of fracture; visualized osseous structures are unremarkable in appearance. Sinuses/Orbits: The  visualized portions of the orbits are within normal limits. The paranasal sinuses and mastoid air cells are well-aerated. Other: No significant soft tissue abnormalities are seen. IMPRESSION: 1. No evidence of traumatic intracranial injury or fracture. 2. Chronic encephalomalacia at the right basal ganglia and right parietal lobe, reflecting prior hemorrhage. Stable appearance from prior studies. Electronically Signed   By: Garald Balding M.D.   On: 02/13/2017 22:50    Procedures Procedures (including critical care time)  Medications Ordered in ED Medications  acetaminophen (TYLENOL) tablet 650 mg (  650 mg Oral Given 02/13/17 2227)     Initial Impression / Assessment and Plan / ED Course  I have reviewed the triage vital signs and the nursing notes.  Pertinent labs & imaging results that were available during my care of the patient were reviewed by me and considered in my medical decision making (see chart for details).     Patient here s/p mechanical fall. CT negative for any acute findings. No changes from previous CT. There is no evidence of wound, redness, swelling, tenderness to palpation area on head where she is having pain. She denies any LOC. She does not remember hitting her head, but states that she "must have hit her head" since she has a headache. Her husband said that he saw her fall and did not see her hit her head. She had abrasions on her left hand over the proximal interphalangeal joints of 4th and 5th fingers. There was no active bleeding and appeared to be small abrasions. No other injuries noted on exam or history. Her, her husband, and her son all state that she is at her baseline at this time. Patient encouraged to follow up with primary care provider in 2-5 days regarding today's visit. Reasons to immediately return to the emergency department discussed. Low suspicion for acute hemorrhage, SAH, new stroke at this time.  Patient case discussed with Dr. Stark Jock who agrees with  assessment and plan.  I personally performed the services described in this documentation, which was scribed in my presence. The recorded information has been reviewed and is accurate.  Final Clinical Impressions(s) / ED Diagnoses   Final diagnoses:  Fall, initial encounter  Nonintractable headache, unspecified chronicity pattern, unspecified headache type    New Prescriptions Discharge Medication List as of 02/14/2017 12:00 AM       [place], [place] 02/14/17 [place], [place] 02/14/17 [place], MD 02/14/17 [phone]

## 2017-02-13 NOTE — ED Notes (Signed)
ED PA at BS 

## 2017-02-13 NOTE — ED Notes (Signed)
H/o CVA 03/2016. LUE flaccid. [employer]. Reports, "got excited, lost balance and fell". Hit L hand on concrete. minimal swelling and abrasion noted to 4-5th digits. No deformity noted, cap refill <2sec. Also hit L parieto/temporal head. Skin intact, no obvious swelling. C/o HA and L hand injury only. HA 5/10. (denies: LOC, neck or back pain, nv, dizziness or other sx). Alert, NAD, calm, interactive, resps e/u, speaking in clear complete sentences, no dyspnea noted, skin W&D. Family at [facility].

## 2017-02-13 NOTE — ED Triage Notes (Addendum)
PREsents post fall- pt has left sided deficits  from previous stroke and uses walker, while walking up the steps at [employer] pt tripped with walker and fell landing on concrete- hit head and left hand-c/o head ache, denies LOC. She is alert and orietned. Lefft hand abraisions.  Taking Plavix

## 2017-02-13 NOTE — ED Notes (Signed)
EDPA into room 

## 2017-02-13 NOTE — Discharge Instructions (Signed)
Please follow-up with your primary care provider in 3-5 days regarding today's visit. Take Tylenol as needed for pain at home. See attached fall prevention tips.  Get help right away if: Your headache becomes severe. You have repeated vomiting. You have a stiff neck. You have a loss of vision. You have problems with speech. You have pain in the eye or ear. You have muscular weakness or loss of muscle control. You lose your balance or have trouble walking. You feel faint or pass out. You have confusion.

## 2017-02-14 NOTE — ED Notes (Signed)
Declined w/c. 

## 2017-02-14 NOTE — ED Notes (Signed)
Out in w/c by EMT, with family.

## 2017-03-08 ENCOUNTER — Ambulatory Visit (HOSPITAL_COMMUNITY)
Admission: EM | Admit: 2017-03-08 | Discharge: 2017-03-08 | Disposition: A | Discharge status: Home / Self Care | Payer: Managed Care, Other (non HMO) | Attending: Emergency Medicine | Admitting: Emergency Medicine

## 2017-03-08 ENCOUNTER — Encounter (HOSPITAL_COMMUNITY): Payer: Self-pay | Admitting: Emergency Medicine

## 2017-03-08 DIAGNOSIS — K047 Periapical abscess without sinus: Secondary | ICD-10-CM | POA: Diagnosis not present

## 2017-03-08 MED ORDER — CLINDAMYCIN HCL 150 MG PO CAPS
150.0000 mg | ORAL_CAPSULE | Freq: Three times a day (TID) | ORAL | 0 refills | Status: DC
Start: 2017-03-08 — End: 2017-03-08

## 2017-03-08 MED ORDER — CLINDAMYCIN HCL 150 MG PO CAPS: 150.0000 mg | ORAL_CAPSULE | Freq: Three times a day (TID) | ORAL | 0 refills | Status: DC

## 2017-03-08 NOTE — ED Triage Notes (Signed)
Pt reports upper and lower dental pain on her right side and swelling on her left.  Pt has no feeling in the left side of her face from a stroke a year ago.

## 2017-03-08 NOTE — Discharge Instructions (Signed)
For your dental infection, I prescribed clindamycin, take one tablet 3 times a day. For pain, due to her allergies, I recommend over-the-counter Tylenol every 4-6 hours. Do not take any more than 4000 mg in any 24-hour day you will need to follow-up with a dentist or your pain will continue if not worsen.

## 2017-03-08 NOTE — ED Provider Notes (Signed)
[name]: [id number]     Arrival date & time 03/08/17  1952 History   None    Chief Complaint  Patient presents with  . Dental Pain   (Consider location/radiation/quality/duration/timing/severity/associated sxs/prior Treatment) 46 year old female presents to clinic for evaluation of dental pain on her right side both upper and lower. She also has dental swelling on her left side. Patient has a past history of CVA, and is unable to feel the left side of her body, states she can't feel any pain on the left, however she has noticeable swelling.  however has had pain with chewing, and pain with food. She has not had any over-the-counter medicines   The history is provided by the patient.  Dental Pain  Location:  Upper and lower Quality:  Constant Severity:  Moderate Duration:  1 week Timing:  Constant Progression:  Worsening Chronicity:  New Context: abscess, dental caries and poor dentition   Relieved by:  None tried Ineffective treatments:  None tried Associated symptoms: facial pain, facial swelling and gum swelling   Associated symptoms: no fever, no headaches, no neck pain, no neck swelling, no oral bleeding and no oral lesions     Past Medical History:  Diagnosis Date  . Essential hypertension 10/19/2007   03-2010: metoprolol changed to bystolic (was not feeling well on it, no specific allergy or reaction)    . Hypertension   . Stroke [facility])    Past Surgical History:  Procedure Laterality Date  . ABDOMINAL HYSTERECTOMY  11-14-07   no oophorectomy per surgical report  . CESAREAN SECTION     [id number]  . INGUINAL HERNIA REPAIR     03/28/03   Family History  Problem Relation Age of Onset  . Diabetes Mother   . Hypertension Mother   . Glaucoma Mother   . Colon cancer Mother     mother age 51  . Stroke Paternal Grandfather   . Heart disease      2 uncles died Mar 28, 2011 from CAD-CHF  . Stroke Maternal Grandfather   . Diabetes Other     siblings  . Hypertension Other     siblings    Social History  Substance Use Topics  . Smoking status: Never Smoker  . Smokeless tobacco: Never Used  . Alcohol use No   OB History    Gravida Para Term Preterm AB Living   2 2           SAB TAB Ectopic Multiple Live Births                 Review of Systems  Constitutional: Negative for fever.  HENT: Positive for facial swelling. Negative for mouth sores.   Musculoskeletal: Negative for neck pain.  Neurological: Negative for headaches.  All other systems reviewed and are negative.   Allergies  Bee venom; Peanut-containing drug products; Hydrocodone; Isovue [iopamidol]; Ambien [zolpidem tartrate]; Aspirin; and Latex  Home Medications   Prior to Admission medications   Medication Sig Start Date End Date Taking? Authorizing Provider  atorvastatin (LIPITOR) 20 MG tablet Take 1 tablet (20 mg total) by mouth daily at 6 PM. 10/12/16  Yes Colon Branch, MD  baclofen (LIORESAL) 10 MG tablet TAKE 1 TABLET(10 MG) BY MOUTH THREE TIMES DAILY 02/09/17  Yes Ankit [name], MD  butalbital-acetaminophen-caffeine (FIORICET, ESGIC) [phone] MG tablet Take 1 tablet by mouth every 4 (four) hours as needed for headache. 05/08/16  Yes Lavon Paganini Angiulli, PA-C  Cholecalciferol (VITAMIN D PO) Take 1 tablet by  mouth daily.    Yes Historical Provider, MD  clopidogrel (PLAVIX) 75 MG tablet Take 1 tablet (75 mg total) by mouth daily. 06/24/16  Yes Rosalin Hawking, MD  FLUoxetine (PROZAC) 20 MG tablet Take 2 tablets (40 mg total) by mouth daily. 12/28/16  Yes Colon Branch, MD  furosemide (LASIX) 20 MG tablet Take 1 tablet (20 mg total) by mouth daily. 01/15/17  Yes Colon Branch, MD  lisinopril (PRINIVIL,ZESTRIL) 2.5 MG tablet Take 1 tablet (2.5 mg total) by mouth daily. 10/07/16  Yes Colon Branch, MD  nebivolol (BYSTOLIC) 5 MG tablet Take 1 tablet (5 mg total) by mouth every 12 (twelve) hours. 10/12/16  Yes Colon Branch, MD  pantoprazole (PROTONIX) 40 MG tablet Take 40 mg by mouth daily.   Yes Historical Provider, MD   clindamycin (CLEOCIN) 150 MG capsule Take 1 capsule (150 mg total) by mouth 3 (three) times daily. 03/08/17   Barnet Glasgow, NP   Meds Ordered and Administered this Visit  Medications - No data to display  BP (!) 149/96 (BP Location: Right Arm)   Pulse 64   Temp 98.4 F (36.9 C) (Oral)   SpO2 97%  No data found.   Physical Exam  Constitutional: She is oriented to person, place, and time. She appears well-developed and well-nourished. No distress.  HENT:  Head: Normocephalic and atraumatic.  Right Ear: External ear normal.  Left Ear: External ear normal.  Mouth/Throat:    Multiple dental caries present, possible dental abscess at the 15th tooth.  Neurological: She is alert and oriented to person, place, and time.  Skin: Skin is warm and dry. Capillary refill takes less than 2 seconds. No rash noted. She is not diaphoretic. No erythema.  Psychiatric: She has a normal mood and affect. Her behavior is normal.  Nursing note and vitals reviewed.   Urgent Care Course     Procedures (including critical care time)  Labs Review Labs Reviewed - No data to display  Imaging Review No results found.          MDM   1. Dental infection    Patient started on clindamycin for dental infection. Given long history of drug allergies, recommend over-the-counter Tylenol every 4 hours for pain relief. Emphasized importance of following up with dentistry, for definitive care    Barnet Glasgow, NP 03/08/17 2121

## 2017-03-15 ENCOUNTER — Other Ambulatory Visit: Payer: Self-pay | Admitting: Physical Medicine & Rehabilitation

## 2017-03-15 ENCOUNTER — Other Ambulatory Visit: Payer: Self-pay | Admitting: Internal Medicine

## 2017-03-15 DIAGNOSIS — I610 Nontraumatic intracerebral hemorrhage in hemisphere, subcortical: Secondary | ICD-10-CM

## 2017-03-16 NOTE — Telephone Encounter (Signed)
Recieved electronic refill request for baclofen, patient has not been seen in office since oct 2017, cancelled last appointment and no future appointment made, please advise

## 2017-03-24 ENCOUNTER — Other Ambulatory Visit: Payer: Self-pay | Admitting: Physical Medicine & Rehabilitation

## 2017-03-24 ENCOUNTER — Telehealth: Payer: Self-pay | Admitting: Internal Medicine

## 2017-03-24 DIAGNOSIS — I610 Nontraumatic intracerebral hemorrhage in hemisphere, subcortical: Secondary | ICD-10-CM

## 2017-03-24 MED ORDER — NEBIVOLOL HCL 5 MG PO TABS: 5.0000 mg | ORAL_TABLET | Freq: Two times a day (BID) | ORAL | 5 refills | Status: DC

## 2017-03-24 NOTE — Telephone Encounter (Signed)
Caller name: Jaimee  Relation to pt: self  Call back number: [phone] [facility] [address]   Reason for call: Pt is needing to change her pharmacy for her prescription since she has [place] and the insurance informed her that [facility] and that they are requiring that prescription should be for a 60 day supply instead of the 30 day supply. Pt states is needing in 5 days her prescription for nebivolol (BYSTOLIC) 5 MG tablet and to please [facility] also [facility]. Please advise.

## 2017-03-24 NOTE — Telephone Encounter (Signed)
[facility] on [address]

## 2017-03-31 ENCOUNTER — Telehealth: Payer: Self-pay | Admitting: Internal Medicine

## 2017-03-31 NOTE — Telephone Encounter (Signed)
Awaiting PA notification from pharmacy.

## 2017-03-31 NOTE — Telephone Encounter (Signed)
[id number] [address], [place]. [phone] (Phone) [phone] (Fax)   Spoke with pharmacy faxing over PA for nebivolol (BYSTOLIC) 5 MG tablet to [phone] Attention Vilma Prader, spouse had called in stating patient has only 1 pill left, please advise

## 2017-04-01 NOTE — Telephone Encounter (Signed)
Called [facility]- [phone] w/ Jana Half, PA for Bystolic 5mg  initiated and sent to pharmacist for approval. Can call in 24 hrs to check status of PA- PA # [id number]. Reference #: [id number].

## 2017-04-02 NOTE — Telephone Encounter (Signed)
Spoke w/ Patty at [facility] approved on 04/01/2017 through 03/27/2018. Telephone reference number: [id number]. [facility] informed of [facility] approval, was able to process and will inform Pt when Rx is ready for pick up.

## 2017-04-05 ENCOUNTER — Telehealth: Payer: Self-pay

## 2017-04-05 ENCOUNTER — Ambulatory Visit (INDEPENDENT_AMBULATORY_CARE_PROVIDER_SITE_OTHER): Payer: Managed Care, Other (non HMO) | Admitting: Internal Medicine

## 2017-04-05 ENCOUNTER — Encounter: Payer: Self-pay | Admitting: Internal Medicine

## 2017-04-05 VITALS — BP 134/78 | HR 65 | Temp 98.1°F | Resp 14 | Ht 59.0 in | Wt 143.5 lb

## 2017-04-05 DIAGNOSIS — I69359 Hemiplegia and hemiparesis following cerebral infarction affecting unspecified side: Secondary | ICD-10-CM

## 2017-04-05 DIAGNOSIS — E785 Hyperlipidemia, unspecified: Secondary | ICD-10-CM | POA: Diagnosis not present

## 2017-04-05 DIAGNOSIS — F329 Major depressive disorder, single episode, unspecified: Secondary | ICD-10-CM

## 2017-04-05 DIAGNOSIS — F32A Depression, unspecified: Secondary | ICD-10-CM

## 2017-04-05 DIAGNOSIS — I10 Essential (primary) hypertension: Secondary | ICD-10-CM

## 2017-04-05 DIAGNOSIS — I610 Nontraumatic intracerebral hemorrhage in hemisphere, subcortical: Secondary | ICD-10-CM

## 2017-04-05 LAB — LIPID PANEL
Cholesterol: 121 mg/dL (ref 0–200)
HDL: 46.4 mg/dL (ref >39.00)
LDL Cholesterol: 64 mg/dL (ref 0–99)
NonHDL: 74.19
Total CHOL/HDL Ratio: 3
Triglycerides: 52 mg/dL (ref 0.0–149.0)
VLDL: 10.4 mg/dL (ref 0.0–40.0)

## 2017-04-05 LAB — BASIC METABOLIC PANEL
BUN: 8 mg/dL (ref 6–23)
CO2: 32 mEq/L (ref 19–32)
Calcium: 9.3 mg/dL (ref 8.4–10.5)
Chloride: 100 mEq/L (ref 96–112)
Creatinine, Ser: 0.77 mg/dL (ref 0.40–1.20)
GFR: 105.01 mL/min (ref >60.00)
Glucose, Bld: 95 mg/dL (ref 70–99)
Potassium: 3.4 mEq/L — ABNORMAL LOW (ref 3.5–5.1)
Sodium: 137 mEq/L (ref 135–145)

## 2017-04-05 LAB — AST: AST: 23 U/L (ref 0–37)

## 2017-04-05 LAB — ALT: ALT: 36 U/L — ABNORMAL HIGH (ref 0–35)

## 2017-04-05 MED ORDER — FUROSEMIDE 20 MG PO TABS: 20.0000 mg | ORAL_TABLET | Freq: Every day | ORAL | 5 refills | Status: DC

## 2017-04-05 MED ORDER — FLUOXETINE HCL 20 MG PO TABS: 40.0000 mg | ORAL_TABLET | Freq: Every day | ORAL | 5 refills | Status: DC

## 2017-04-05 MED ORDER — ATORVASTATIN CALCIUM 20 MG PO TABS: 20.0000 mg | ORAL_TABLET | Freq: Every day | ORAL | 5 refills | Status: DC

## 2017-04-05 MED ORDER — BACLOFEN 10 MG PO TABS
10.0000 mg | ORAL_TABLET | Freq: Three times a day (TID) | ORAL | 5 refills | Status: DC
Start: 2017-04-05 — End: 2017-10-23

## 2017-04-05 MED ORDER — LISINOPRIL 2.5 MG PO TABS: 2.5000 mg | ORAL_TABLET | Freq: Every day | ORAL | 5 refills | Status: DC

## 2017-04-05 NOTE — Telephone Encounter (Signed)
Received medical clearance form from [facility], form completed during today's OV and faxed to [phone]. Form sent for scanning.

## 2017-04-05 NOTE — Telephone Encounter (Signed)
Called [place] [phone], spoke w/ Joellen [place], [place] initiated and approved through April 2019. PA number: [id number]. Reference number: [id number]. [facility].

## 2017-04-05 NOTE — Progress Notes (Signed)
Pre visit review using our clinic review tool, if applicable. No additional management support is needed unless otherwise documented below in the visit note. 

## 2017-04-05 NOTE — Progress Notes (Signed)
Subjective:    Patient ID: Abigail Campos, female    DOB: 26-Sep-1971, 46 y.o.   MRN: [id number]  DOS:  04/05/2017 Type of visit - description : Routine checkup Interval history: Had a dental infection, status post antibiotics, doing better. Needs a form to be sent to her dentist HTN: Good med  compliance  , ambulatory BPs in the 532D systolics 92E. High cholesterol: On statins, she is fasting for labs   Review of Systems  denies chest pain or difficulty breathing No nausea, vomiting, diarrhea She is doing some progress with physical therapy  Past Medical History:  Diagnosis Date  . Essential hypertension 10/19/2007   03-2010: metoprolol changed to bystolic (was not feeling well on it, no specific allergy or reaction)    . Hypertension   . Stroke [facility])     Past Surgical History:  Procedure Laterality Date  . ABDOMINAL HYSTERECTOMY  11-14-07   no oophorectomy per surgical report  . CESAREAN SECTION     [id number]  . INGUINAL HERNIA REPAIR     2004    Social History   Social History  . Marital status: Married    Spouse name: N/A  . Number of children: 2  . Years of education: N/A   Occupational History  . [employer]   Social History Main Topics  . Smoking status: Never Smoker  . Smokeless tobacco: Never Used  . Alcohol use No  . Drug use: No  . Sexual activity: Not on file   Other Topics Concern  . Not on file   Social History Narrative   Used to leve independently, stroke 03-2016, d/c from rehab to his mother's house 04-2016.   1 child in college, another child w/ her       Allergies as of 04/05/2017      Reactions   Bee Venom Anaphylaxis   Peanut-containing Drug Products Anaphylaxis   Hydrocodone Hives   Generalize itching w/o rash   Isovue [iopamidol] Hives, Itching   Pt broke out with one facial hive.  Had itching on her right upper ant and posterior arm w/o evidence of hives.  Pt given water and 25mg  benadryl po.  We observed pt for 30  minutes before d/c.  J. Bohm     Ambien [zolpidem Tartrate] Other (See Comments)   forgetfulness   Aspirin Swelling   Reports blisters w/ ASA but pt reports is ok w/ Motrin, Advil, naproxen   Latex Hives      Medication List       Accurate as of 04/05/17  6:51 PM. Always use your most recent med list.          atorvastatin 20 MG tablet Commonly known as:  LIPITOR Take 1 tablet (20 mg total) by mouth daily at 6 PM.   baclofen 10 MG tablet Commonly known as:  LIORESAL Take 1 tablet (10 mg total) by mouth 3 (three) times daily.   butalbital-acetaminophen-caffeine 50-325-40 MG tablet Commonly known as:  FIORICET, ESGIC Take 1 tablet by mouth every 4 (four) hours as needed for headache.   clopidogrel 75 MG tablet Commonly known as:  PLAVIX Take 1 tablet (75 mg total) by mouth daily.   FLUoxetine 20 MG tablet Commonly known as:  PROZAC Take 2 tablets (40 mg total) by mouth daily.   furosemide 20 MG tablet Commonly known as:  LASIX Take 1 tablet (20 mg total) by mouth daily.   lisinopril 2.5 MG tablet Commonly known as:  PRINIVIL,ZESTRIL Take 1 tablet (2.5 mg total) by mouth daily.   nebivolol 5 MG tablet Commonly known as:  BYSTOLIC Take 1 tablet (5 mg total) by mouth every 12 (twelve) hours.   pantoprazole 40 MG tablet Commonly known as:  PROTONIX Take 40 mg by mouth daily.   VITAMIN D PO Take 1 tablet by mouth daily.          Objective:   Physical Exam BP 134/78 (BP Location: Right Arm, Patient Position: Sitting, Cuff Size: Normal)   Pulse 65   Temp 98.1 F (36.7 C) (Oral)   Resp 14   Ht 4\' 11"  (1.499 m)   Wt 143 lb 8 oz (65.1 kg)   SpO2 98%   BMI 28.98 kg/m  General:   Well developed, well nourished . NAD.  HEENT:  Normocephalic . Face symmetric, atraumatic. TMs normal. Gum: No obvious abscesses, swelling or tenderness. Teeth in very poor condition Lungs:  CTA B Normal respiratory effort, no intercostal retractions, no accessory muscle  use. Heart: RRR,  no murmur.  No pretibial edema bilaterally  Skin: Not pale. Not jaundice Neurologic:  alert & oriented X3.  Speech normal, walks with a cane, seems steady.  Psych--  Cognition and judgment appear intact.  Cooperative with normal attention span and concentration.  Behavior appropriate. No anxious or depressed appearing.     Assessment & Plan:  Assessment > Prediabetes: A1c 6.0 (April 2017)  HTN Hyperlipidemia Depression GERD Stroke 03-2016:  ICH, right basilar ganglia due to HTN emergency, + encephalopathy, + dysphagia, aphasia, dysarthria Residual L spasticity  Headaches , migraines dx after 2nd pregnancy, (-) CT head 2014 and 2015 Insomnia  Cough, persisting, saw Dr. Melvyn Novas 11-2015  PLAN: HTN: Controlled on Lasix, lisinopril, Bystolic. Check a BMP Hyperlipidemia: On Lipitor, check FLP, AST, ALT Dental infection: S/p abx, no obvious infex on today's exam, she definitely has poor dentition. Form for her dentist filled but needs to consult neurology to see if stopping Plavix is feasible. Stroke sequela:  Lives w/ her mother, gait has improved, still uses a wheelchair but also a cane.   Depression: Well controlled Multiple refills RTC, CPX 4-6  months

## 2017-04-05 NOTE — Patient Instructions (Signed)
GO TO THE LAB : Get the blood work     GO TO THE FRONT DESK Schedule your next appointment for a  physical exam in 4-6 months    Lee's ask your dentist to contact Dr. Erlinda Hong  to see if we can safely stop Plavix

## 2017-04-05 NOTE — Assessment & Plan Note (Signed)
HTN: Controlled on Lasix, lisinopril, Bystolic. Check a BMP Hyperlipidemia: On Lipitor, check FLP, AST, ALT Dental infection: S/p abx, no obvious infex on today's exam, she definitely has poor dentition. Form for her dentist filled but needs to consult neurology to see if stopping Plavix is feasible. Stroke sequela:  Lives w/ her mother, gait has improved, still uses a wheelchair but also a cane.   Depression: Well controlled Multiple refills RTC, CPX 4-6  months

## 2017-04-05 NOTE — Telephone Encounter (Signed)
Received fax confirmation 04/05/2017 at 1203.

## 2017-04-14 NOTE — Addendum Note (Signed)
Addended by: Garald Balding B on: 04/14/2017 02:25 PM   Modules accepted: Orders

## 2017-04-14 NOTE — Addendum Note (Signed)
Addended by: Garald Balding B on: 04/14/2017 02:27 PM   Modules accepted: Orders

## 2017-04-14 NOTE — Addendum Note (Signed)
Addended by: Garald Balding B on: 04/14/2017 02:21 PM   Modules accepted: Orders

## 2017-04-14 NOTE — Addendum Note (Signed)
Addended by: Garald Balding B on: 04/14/2017 02:30 PM   Modules accepted: Orders

## 2017-04-30 ENCOUNTER — Other Ambulatory Visit: Payer: Self-pay | Admitting: Internal Medicine

## 2017-05-21 ENCOUNTER — Other Ambulatory Visit: Payer: Self-pay | Admitting: Neurology

## 2017-05-21 ENCOUNTER — Telehealth: Payer: Self-pay

## 2017-05-21 MED ORDER — CLOPIDOGREL BISULFATE 75 MG PO TABS: 75.0000 mg | ORAL_TABLET | Freq: Every day | ORAL | 2 refills | Status: DC

## 2017-05-21 NOTE — Telephone Encounter (Signed)
Clearance form fax to Dr. Buelah Manis at [phone].

## 2017-06-12 ENCOUNTER — Encounter (HOSPITAL_BASED_OUTPATIENT_CLINIC_OR_DEPARTMENT_OTHER): Payer: Self-pay | Admitting: *Deleted

## 2017-06-12 ENCOUNTER — Emergency Department (HOSPITAL_BASED_OUTPATIENT_CLINIC_OR_DEPARTMENT_OTHER)
Admission: EM | Admit: 2017-06-12 | Discharge: 2017-06-12 | Disposition: A | Discharge status: Home / Self Care | Payer: Managed Care, Other (non HMO) | Attending: Emergency Medicine | Admitting: Emergency Medicine

## 2017-06-12 DIAGNOSIS — K0889 Other specified disorders of teeth and supporting structures: Secondary | ICD-10-CM | POA: Insufficient documentation

## 2017-06-12 DIAGNOSIS — Z79899 Other long term (current) drug therapy: Secondary | ICD-10-CM | POA: Insufficient documentation

## 2017-06-12 DIAGNOSIS — Z91040 Latex allergy status: Secondary | ICD-10-CM | POA: Insufficient documentation

## 2017-06-12 DIAGNOSIS — I10 Essential (primary) hypertension: Secondary | ICD-10-CM | POA: Diagnosis not present

## 2017-06-12 DIAGNOSIS — K068 Other specified disorders of gingiva and edentulous alveolar ridge: Secondary | ICD-10-CM

## 2017-06-12 DIAGNOSIS — Z7902 Long term (current) use of antithrombotics/antiplatelets: Secondary | ICD-10-CM | POA: Diagnosis not present

## 2017-06-12 HISTORY — DX: Hyperlipidemia, unspecified: E78.5

## 2017-06-12 NOTE — ED Triage Notes (Signed)
Had 14 teeth pulled 8 days ago yesterday started having bleeding on top left,  Called dentist and was told to use tea bags to stop bleeding  Did not work

## 2017-06-12 NOTE — Discharge Instructions (Signed)
You were seen in the emergency department for bleeding from your gums upper dental extraction. This is likely because you are on Plavix. If you begin bleeding again I recommend you put a teabag in your mouth and bite down on it for 30 minutes without stopping. If this does not stop the bleeding, please contact your dentist. Please continue soft diet. Do not used straws. Do not smoke.  Do not brush your teeth vigorously or swish vigorously.

## 2017-06-12 NOTE — ED Provider Notes (Signed)
TIME SEEN: 6:11 AM  CHIEF COMPLAINT: Dental bleeding  HPI: Patient is a 46 year old female with history of hypertension, hyperlipidemia, previous hemorrhagic stroke who is now back on Plavix who presents emergency department for bleeding from her right upper gumline that started yesterday morning at 8 AM. She states she previously had 14 teeth extracted by Dr. Buelah Manis on June 21. She states she contacted Dr. Dorian Heckle office to instructed her to use a teabag to stop the bleeding. She states that she has had a teabag in her mouth all day.  She states it has continued to use. No fever, facial swelling. She states she is still eating only a liquid, soft diet. She is not using straws and is not smoking. She did not hold her Plavix at any time. No document of history of thrombocytopenia.  ROS: See HPI Constitutional: no fever  Eyes: no drainage  ENT: no runny nose   Cardiovascular:  no chest pain  Resp: no SOB  GI: no vomiting GU: no dysuria Integumentary: no rash  Allergy: no hives  Musculoskeletal: no leg swelling  Neurological: no slurred speech ROS otherwise negative  PAST MEDICAL HISTORY/PAST SURGICAL HISTORY:  Past Medical History:  Diagnosis Date  . Essential hypertension 10/19/2007   03-2010: metoprolol changed to bystolic (was not feeling well on it, no specific allergy or reaction)    . Hyperlipemia   . Hypertension   . Stroke [facility])     MEDICATIONS:  Prior to Admission medications   Medication Sig Start Date End Date Taking? Authorizing Provider  atorvastatin (LIPITOR) 20 MG tablet Take 1 tablet (20 mg total) by mouth daily at 6 PM. 04/05/17   Colon Branch, MD  atorvastatin (LIPITOR) 20 MG tablet TAKE 1 TABLET BY MOUTH DAILY AT 6 PM 04/30/17   Colon Branch, MD  baclofen (LIORESAL) 10 MG tablet Take 1 tablet (10 mg total) by mouth 3 (three) times daily. 04/05/17   Colon Branch, MD  butalbital-acetaminophen-caffeine (FIORICET, ESGIC) [phone] MG tablet Take 1 tablet by mouth every 4  (four) hours as needed for headache. Patient not taking: Reported on 04/05/2017 05/08/16   Angiulli, Lavon Paganini, PA-C  Cholecalciferol (VITAMIN D PO) Take 1 tablet by mouth daily.     [provider]  clopidogrel (PLAVIX) 75 MG tablet Take 1 tablet (75 mg total) by mouth daily. 05/21/17   Rosalin Hawking, MD  FLUoxetine (PROZAC) 20 MG tablet Take 2 tablets (40 mg total) by mouth daily. 04/05/17   Colon Branch, MD  furosemide (LASIX) 20 MG tablet Take 1 tablet (20 mg total) by mouth daily. 04/05/17   Colon Branch, MD  lisinopril (PRINIVIL,ZESTRIL) 2.5 MG tablet Take 1 tablet (2.5 mg total) by mouth daily. 04/05/17   Colon Branch, MD  nebivolol (BYSTOLIC) 5 MG tablet Take 1 tablet (5 mg total) by mouth every 12 (twelve) hours. 03/24/17   Colon Branch, MD  pantoprazole (PROTONIX) 40 MG tablet Take 40 mg by mouth daily.    [provider]    ALLERGIES:  Allergies  Allergen Reactions  . Bee Venom Anaphylaxis  . Peanut-Containing Drug Products Anaphylaxis  . Hydrocodone Hives    Generalize itching w/o rash  . Isovue [Iopamidol] Hives and Itching    Pt broke out with one facial hive.  Had itching on her right upper ant and posterior arm w/o evidence of hives.  Pt given water and 25mg  benadryl po.  We observed pt for 30 minutes before d/c.  JDanie Chandler    . Ambien [Zolpidem Tartrate] Other (See Comments)    forgetfulness  . Aspirin Swelling    Reports blisters w/ ASA but pt reports is ok w/ Motrin, Advil, naproxen  . Latex Hives    SOCIAL HISTORY:  Social History  Substance Use Topics  . Smoking status: Never Smoker  . Smokeless tobacco: Never Used  . Alcohol use No    FAMILY HISTORY: Family History  Problem Relation Age of Onset  . Diabetes Mother   . Hypertension Mother   . Glaucoma Mother   . Colon cancer Mother        mother age 75  . Stroke Paternal Grandfather   . Heart disease Unknown        2 uncles died 03/21/2011 from CAD-CHF  . Stroke Maternal Grandfather   . Diabetes Other         siblings  . Hypertension Other        siblings    EXAM: BP 140/84 (BP Location: Right Arm)   Pulse (!) 56   Temp 98.6 F (37 C) (Oral)   Resp 18   Ht 4' 11.5" (1.511 m)   Wt 64.9 kg (143 lb)   SpO2 99%   BMI 28.40 kg/m  CONSTITUTIONAL: Alert and oriented and responds appropriately to questions. Well-appearing; well-nourished HEAD: Normocephalic EYES: Conjunctivae clear, pupils appear equal, EOMI ENT: normal nose; moist mucous membranes; No pharyngeal erythema or petechiae, no tonsillar hypertrophy or exudate, no uvular deviation, no unilateral swelling, no trismus or drooling, no muffled voice, normal phonation, no stridor, patient has had multiple teeth extracted and has swelling of the gums that appears to be reactive. There is no active bleeding at this time. No drainage. No significant erythema noted. No dental caries present, no drainable dental abscess noted, no Ludwig's angina, tongue sits flat in the bottom of the mouth, no angioedema, no facial erythema or warmth, no facial swelling; no pain with movement of the neck. NECK: Supple, no meningismus, no nuchal rigidity, no LAD  CARD: RRR; S1 and S2 appreciated; no murmurs, no clicks, no rubs, no gallops RESP: Normal chest excursion without splinting or tachypnea; breath sounds clear and equal bilaterally; no wheezes, no rhonchi, no rales, no hypoxia or respiratory distress, speaking full sentences ABD/GI: Normal bowel sounds; non-distended; soft, non-tender, no rebound, no guarding, no peritoneal signs, no hepatosplenomegaly BACK:  The back appears normal and is non-tender to palpation, there is no CVA tenderness EXT: Normal ROM in all joints; non-tender to palpation; no edema; normal capillary refill; no cyanosis, no calf tenderness or swelling    SKIN: Normal color for age and race; warm; no rash NEURO: Chronic left-sided weakness PSYCH: The patient's mood and manner are appropriate. Grooming and personal hygiene are  appropriate.  MEDICAL DECISION MAKING: Patient here with bleeding after dental extraction. Bleeding has currently stopped after using direct pressure with a tea bag.  She has no history of thrombocytopenia. She is on Plavix which is likely why bleeding was difficult to control. She denies any hemorrhage. She states she did have 2 small clots in her mouth which I discussed with her would be normal.  No other sign of bleeding gums. No sign of any infection at this time. She will follow-up with her dentist Dr. Buelah Manis. We have discussed return precautions. Discussed supportive measures at home if bleeding were to start again. Patient is comfortable with this plan.  At this time, I do not feel there is any life-threatening  condition present. I have reviewed and discussed all results (EKG, imaging, lab, urine as appropriate) and exam findings with patient/family. I have reviewed nursing notes and appropriate previous records.  I feel the patient is safe to be discharged home without further emergent workup and can continue workup as an outpatient as needed. Discussed usual and customary return precautions. Patient/family verbalize understanding and are comfortable with this plan.  Outpatient follow-up has been provided if needed. All questions have been answered.      Ward, Delice Bison, DO 06/12/17 [phone]

## 2017-08-05 ENCOUNTER — Ambulatory Visit (INDEPENDENT_AMBULATORY_CARE_PROVIDER_SITE_OTHER): Payer: 59 | Admitting: Internal Medicine

## 2017-08-05 ENCOUNTER — Encounter: Payer: Self-pay | Admitting: Internal Medicine

## 2017-08-05 VITALS — BP 150/100 | HR 55 | Temp 98.2°F | Resp 16 | Ht 60.0 in | Wt 154.0 lb

## 2017-08-05 DIAGNOSIS — I69359 Hemiplegia and hemiparesis following cerebral infarction affecting unspecified side: Secondary | ICD-10-CM

## 2017-08-05 DIAGNOSIS — R739 Hyperglycemia, unspecified: Secondary | ICD-10-CM | POA: Diagnosis not present

## 2017-08-05 DIAGNOSIS — G4700 Insomnia, unspecified: Secondary | ICD-10-CM

## 2017-08-05 DIAGNOSIS — E876 Hypokalemia: Secondary | ICD-10-CM

## 2017-08-05 DIAGNOSIS — F32A Depression, unspecified: Secondary | ICD-10-CM

## 2017-08-05 DIAGNOSIS — F329 Major depressive disorder, single episode, unspecified: Secondary | ICD-10-CM | POA: Diagnosis not present

## 2017-08-05 DIAGNOSIS — I10 Essential (primary) hypertension: Secondary | ICD-10-CM | POA: Diagnosis not present

## 2017-08-05 DIAGNOSIS — I610 Nontraumatic intracerebral hemorrhage in hemisphere, subcortical: Secondary | ICD-10-CM

## 2017-08-05 DIAGNOSIS — R635 Abnormal weight gain: Secondary | ICD-10-CM

## 2017-08-05 LAB — CBC WITH DIFFERENTIAL/PLATELET
Basophils Absolute: 0 10*3/uL (ref 0.0–0.1)
Basophils Relative: 0.4 % (ref 0.0–3.0)
Eosinophils Absolute: 0 10*3/uL (ref 0.0–0.7)
Eosinophils Relative: 0.8 % (ref 0.0–5.0)
HCT: 38.2 % (ref 36.0–46.0)
Hemoglobin: 12.2 g/dL (ref 12.0–15.0)
Lymphocytes Relative: 30.1 % (ref 12.0–46.0)
Lymphs Abs: 1.1 10*3/uL (ref 0.7–4.0)
MCHC: 32 g/dL (ref 30.0–36.0)
MCV: 94.7 fl (ref 78.0–100.0)
Monocytes Absolute: 0.3 10*3/uL (ref 0.1–1.0)
Monocytes Relative: 9 % (ref 3.0–12.0)
Neutro Abs: 2.2 10*3/uL (ref 1.4–7.7)
Neutrophils Relative %: 59.7 % (ref 43.0–77.0)
Platelets: 161 10*3/uL (ref 150.0–400.0)
RBC: 4.03 Mil/uL (ref 3.87–5.11)
RDW: 14.1 % (ref 11.5–15.5)
WBC: 3.7 10*3/uL — ABNORMAL LOW (ref 4.0–10.5)

## 2017-08-05 LAB — BASIC METABOLIC PANEL
BUN: 9 mg/dL (ref 6–23)
CO2: 30 mEq/L (ref 19–32)
Calcium: 9 mg/dL (ref 8.4–10.5)
Chloride: 103 mEq/L (ref 96–112)
Creatinine, Ser: 0.76 mg/dL (ref 0.40–1.20)
GFR: 106.44 mL/min (ref >60.00)
Glucose, Bld: 88 mg/dL (ref 70–99)
Potassium: 3.3 mEq/L — ABNORMAL LOW (ref 3.5–5.1)
Sodium: 138 mEq/L (ref 135–145)

## 2017-08-05 LAB — HEMOGLOBIN A1C: Hgb A1c MFr Bld: 5.2 % (ref 4.6–6.5)

## 2017-08-05 LAB — TSH: TSH: 1.56 u[IU]/mL (ref 0.35–4.50)

## 2017-08-05 MED ORDER — ESCITALOPRAM OXALATE 10 MG PO TABS: 10.0000 mg | ORAL_TABLET | Freq: Every day | ORAL | 3 refills | Status: DC

## 2017-08-05 NOTE — Assessment & Plan Note (Signed)
HTN: Reports is well-controlled in the ambulatory settings, BP today slightly elevated. Will check a BMP and CBC, continue with Lasix, lisinopril and Bystolic. Recommend to check BPs daily for 10 days and then twice a week. Call if they are elevated. Depression: Was doing okay on Prozac but evidently is not working well for her . Unfortunately has not been able to see a therapist. We agreed to switch to Lexapro 10 mg daily, she will call in 4 weeks and let me know how she's doing. Consider a higher lexapro  dose. Insomnia: Previously did not like Ambien, recommend melatonin every night. Switching SSRIs may also help. Stroke sequela: Having more falls since she stopped PT. Re refer to PT. Weight gain: I think is related to diet and the fact that she is not exercising as much (since PT stopped). Check a TSH. counseled  Hyperglycemia: Check A1c RTC 3 months.

## 2017-08-05 NOTE — Patient Instructions (Signed)
GO TO THE LAB : Get the blood work     GO TO THE FRONT DESK Schedule your next appointment for a  routine checkup in 3 months  ===  Stop fluoxetine  Start Lexapro 10 mg one tablet daily  Please call in 4 weeks, let me know how you're doing, we may need to increase Lexapro if needed  For difficulty sleeping: Get OTC melatonin, take 1 every night

## 2017-08-05 NOTE — Progress Notes (Signed)
Pre visit review using our clinic review tool, if applicable. No additional management support is needed unless otherwise documented below in the visit note. 

## 2017-08-05 NOTE — Progress Notes (Signed)
Subjective:    Patient ID: Abigail Campos, female    DOB: [birth year], 46 y.o.   MRN: [id number]  DOS:  08/05/2017 Type of visit - description : rov, Here with a family member Interval history: HTN: Good compliance of medication, BP today is elevated, she assures me that she checks BPs twice a week and readings are ~120/78 Depression: Prozac used to work better for her, still feeling depressed. Not suicidal. Has been unable to see a therapist. Insomnia: Not sleeping well, previously tried Ambien but she did not like it. Weight gain: Has gained several pounds since the last visit, family member present during the visit reports that she is eating a lot of carbohydrates and sweets. PT ended 2- 2018 and she has not been as active as before. Also, had several falls since PT ended, fortunately no major injuries, specifically no head or neck injury.   Review of Systems Denies any suicidal ideas. Some anxiety  Past Medical History:  Diagnosis Date  . Essential hypertension 10/19/2007   03-2010: metoprolol changed to bystolic (was not feeling well on it, no specific allergy or reaction)    . Hyperlipemia   . Hypertension   . Stroke [facility])     Past Surgical History:  Procedure Laterality Date  . ABDOMINAL HYSTERECTOMY  11-14-07   no oophorectomy per surgical report  . CESAREAN SECTION     [id number]  . INGUINAL HERNIA REPAIR     2004    Social History   Social History  . Marital status: Married    Spouse name: N/A  . Number of children: 2  . Years of education: N/A   Occupational History  . [employer]   Social History Main Topics  . Smoking status: Never Smoker  . Smokeless tobacco: Never Used  . Alcohol use No  . Drug use: No  . Sexual activity: Not on file   Other Topics Concern  . Not on file   Social History Narrative   Used to leve independently, stroke 03-2016, d/c from rehab to his mother's house 04-2016.   1 child in college, another child w/ her        Allergies as of 08/05/2017      Reactions   Bee Venom Anaphylaxis   Peanut-containing Drug Products Anaphylaxis   Hydrocodone Hives   Generalize itching w/o rash   Isovue [iopamidol] Hives, Itching   Pt broke out with one facial hive.  Had itching on her right upper ant and posterior arm w/o evidence of hives.  Pt given water and 25mg  benadryl po.  We observed pt for 30 minutes before d/c.  J. Bohm     Ambien [zolpidem Tartrate] Other (See Comments)   forgetfulness   Aspirin Swelling   Reports blisters w/ ASA but pt reports is ok w/ Motrin, Advil, naproxen   Latex Hives      Medication List       Accurate as of 08/05/17  6:31 PM. Always use your most recent med list.          atorvastatin 20 MG tablet Commonly known as:  LIPITOR Take 1 tablet (20 mg total) by mouth daily at 6 PM.   baclofen 10 MG tablet Commonly known as:  LIORESAL Take 1 tablet (10 mg total) by mouth 3 (three) times daily.   clopidogrel 75 MG tablet Commonly known as:  PLAVIX Take 1 tablet (75 mg total) by mouth daily.   escitalopram 10 MG tablet Commonly known  asLoma Sousa Take 1 tablet (10 mg total) by mouth daily.   furosemide 20 MG tablet Commonly known as:  LASIX Take 1 tablet (20 mg total) by mouth daily.   lisinopril 2.5 MG tablet Commonly known as:  PRINIVIL,ZESTRIL Take 1 tablet (2.5 mg total) by mouth daily.   nebivolol 5 MG tablet Commonly known as:  BYSTOLIC Take 1 tablet (5 mg total) by mouth every 12 (twelve) hours.            Discharge Care Instructions        Start     Ordered   08/05/17 0000  escitalopram (LEXAPRO) 10 MG tablet  Daily     08/05/17 1105   08/05/17 3557  Basic metabolic panel     32/20/25 1106   08/05/17 0000  CBC w/Diff     08/05/17 1106   08/05/17 0000  Hemoglobin A1c     08/05/17 1106   08/05/17 0000  TSH     08/05/17 1106   08/05/17 0000  Ambulatory referral to Home Health    Comments:  Please evaluate [name] for admission  to [facility].  Disciplines requested: gait training, strength training  Services to provide: physical therapy  Physician to follow patient's care (the person listed here will be responsible for signing ongoing orders): Colon Branch, MD  Requested Start of Care Date: ASAP  I certify that this patient is under my care and that I, or a Nurse Practitioner or Physician's Assistant working with me, had a face-to-face encounter that meets the physician face-to-face requirements with patient on 08/05/2017. The encounter with the patient was in whole, or in part for the following medical condition(s) which is the primary reason for home health care (List medical condition). Stroke, Hemiplegic- poststroke. HTN, Hyperlipidemia, Depression  Special Instructions: n/a  Question Answer Comment  Does the patient have Medicare or Medicaid? Yes Medicaid secondary  The encounter with the patient was in whole, or in part, for the following medical condition, which is the primary reason for home health care stoke sequela   Reason for Medically Necessary Home Health Services Therapy- Therapeutic Exercises to Increase Strength and Endurance   My clinical findings support the need for the above services Unable to leave home safely without assistance and/or assistive device   I certify that, based on my findings, the following services are medically necessary home health services Physical therapy   Further, I certify that my clinical findings support that this patient is homebound due to: Unsafe ambulation due to balance issues      08/05/17 1112         Objective:   Physical Exam BP (!) 150/100 (BP Location: Right Arm, Cuff Size: Normal)   Pulse (!) 55   Temp 98.2 F (36.8 C) (Oral)   Resp 16   Ht 5' (1.524 m)   Wt 154 lb (69.9 kg)   SpO2 98%   BMI 30.08 kg/m  General:   Well developed, well nourished . NAD.  HEENT:  Normocephalic . Face symmetric, atraumatic Lungs:  CTA B Normal respiratory  effort, no intercostal retractions, no accessory muscle use. Heart: RRR,  no murmur.  No pretibial edema bilaterally  Skin: Not pale. Not jaundice Neurologic:  alert & oriented X3.  Speech normal, gait assisted, symptoms as take today. + Motor deficits, left side, more noticeable at the upper extremity. Psych--  Cognition and judgment appear intact.  Cooperative with normal attention span and concentration.  Behavior  appropriate. No anxious or depressed appearing.      Assessment & Plan:   Assessment   Prediabetes: A1c 6.0 (April 2017)  HTN Hyperlipidemia Depression GERD Stroke 03-2016:  ICH, right basilar ganglia due to HTN emergency, + encephalopathy, + dysphagia, aphasia, dysarthria Residual L spasticity  Headaches , migraines dx after 2nd pregnancy, (-) CT head 2014 and 2015 Insomnia (did like  Cough, persisting, saw Dr. Melvyn Novas 11-2015  PLAN: HTN: Reports is well-controlled in the ambulatory settings, BP today slightly elevated. Will check a BMP and CBC, continue with Lasix, lisinopril and Bystolic. Recommend to check BPs daily for 10 days and then twice a week. Call if they are elevated. Depression: Was doing okay on Prozac but evidently is not working well for her . Unfortunately has not been able to see a therapist. We agreed to switch to Lexapro 10 mg daily, she will call in 4 weeks and let me know how she's doing. Consider a higher lexapro  dose. Insomnia: Previously did not like Ambien, recommend melatonin every night. Switching SSRIs may also help. Stroke sequela: Having more falls since she stopped PT. Re refer to PT. Weight gain: I think is related to diet and the fact that she is not exercising as much (since PT stopped). Check a TSH. counseled  Hyperglycemia: Check A1c RTC 3 months.

## 2017-08-06 MED ORDER — POTASSIUM CHLORIDE ER 10 MEQ PO TBCR: 10.0000 meq | EXTENDED_RELEASE_TABLET | Freq: Every day | ORAL | 0 refills | Status: DC

## 2017-08-06 NOTE — Addendum Note (Signed)
Addended byDamita Dunnings D on: 08/06/2017 09:25 AM   Modules accepted: Orders

## 2017-08-15 ENCOUNTER — Other Ambulatory Visit: Payer: Self-pay | Admitting: Neurology

## 2017-08-17 ENCOUNTER — Other Ambulatory Visit: Payer: Self-pay

## 2017-08-17 MED ORDER — CLOPIDOGREL BISULFATE 75 MG PO TABS: 75.0000 mg | ORAL_TABLET | Freq: Every day | ORAL | 1 refills | Status: DC

## 2017-08-29 ENCOUNTER — Other Ambulatory Visit: Payer: Self-pay | Admitting: Internal Medicine

## 2017-09-13 ENCOUNTER — Other Ambulatory Visit: Payer: Self-pay | Admitting: Internal Medicine

## 2017-09-16 ENCOUNTER — Other Ambulatory Visit: Payer: Self-pay | Admitting: Internal Medicine

## 2017-09-21 ENCOUNTER — Other Ambulatory Visit: Payer: Self-pay

## 2017-09-21 MED ORDER — ESCITALOPRAM OXALATE 10 MG PO TABS: 10.0000 mg | ORAL_TABLET | Freq: Every day | ORAL | 0 refills | Status: DC

## 2017-09-24 ENCOUNTER — Telehealth: Payer: Self-pay | Admitting: Internal Medicine

## 2017-09-24 NOTE — Telephone Encounter (Signed)
Letter printed and mailed to Pt. BMP order pending on 08/06/2017.

## 2017-09-24 NOTE — Telephone Encounter (Signed)
Overdue for a BMP, please arrange

## 2017-10-04 ENCOUNTER — Emergency Department (HOSPITAL_COMMUNITY): Payer: 59

## 2017-10-04 ENCOUNTER — Encounter (HOSPITAL_COMMUNITY): Payer: Self-pay | Admitting: Emergency Medicine

## 2017-10-04 ENCOUNTER — Emergency Department (HOSPITAL_COMMUNITY)
Admission: EM | Admit: 2017-10-04 | Discharge: 2017-10-04 | Disposition: A | Discharge status: Home / Self Care | Payer: 59 | Attending: Emergency Medicine | Admitting: Emergency Medicine

## 2017-10-04 DIAGNOSIS — E785 Hyperlipidemia, unspecified: Secondary | ICD-10-CM | POA: Insufficient documentation

## 2017-10-04 DIAGNOSIS — Z79899 Other long term (current) drug therapy: Secondary | ICD-10-CM | POA: Insufficient documentation

## 2017-10-04 DIAGNOSIS — R0789 Other chest pain: Secondary | ICD-10-CM | POA: Diagnosis present

## 2017-10-04 DIAGNOSIS — Y998 Other external cause status: Secondary | ICD-10-CM | POA: Diagnosis not present

## 2017-10-04 DIAGNOSIS — Z8673 Personal history of transient ischemic attack (TIA), and cerebral infarction without residual deficits: Secondary | ICD-10-CM | POA: Insufficient documentation

## 2017-10-04 DIAGNOSIS — I10 Essential (primary) hypertension: Secondary | ICD-10-CM | POA: Insufficient documentation

## 2017-10-04 DIAGNOSIS — R0781 Pleurodynia: Secondary | ICD-10-CM

## 2017-10-04 DIAGNOSIS — Y92002 Bathroom of unspecified non-institutional (private) residence single-family (private) house as the place of occurrence of the external cause: Secondary | ICD-10-CM | POA: Insufficient documentation

## 2017-10-04 DIAGNOSIS — W19XXXA Unspecified fall, initial encounter: Secondary | ICD-10-CM

## 2017-10-04 DIAGNOSIS — W010XXA Fall on same level from slipping, tripping and stumbling without subsequent striking against object, initial encounter: Secondary | ICD-10-CM | POA: Insufficient documentation

## 2017-10-04 DIAGNOSIS — Y939 Activity, unspecified: Secondary | ICD-10-CM | POA: Diagnosis not present

## 2017-10-04 DIAGNOSIS — Y92009 Unspecified place in unspecified non-institutional (private) residence as the place of occurrence of the external cause: Secondary | ICD-10-CM

## 2017-10-04 DIAGNOSIS — Z91010 Allergy to peanuts: Secondary | ICD-10-CM | POA: Insufficient documentation

## 2017-10-04 IMAGING — DX DG CHEST 2V
2 series · 2 of 2 positions shown · non-contrast
Comparison: Chest x-ray dated [DATE].

CLINICAL DATA: Pain under left breast after fall on [REDACTED].

EXAM:
CHEST  2 VIEW

[w chest lat]
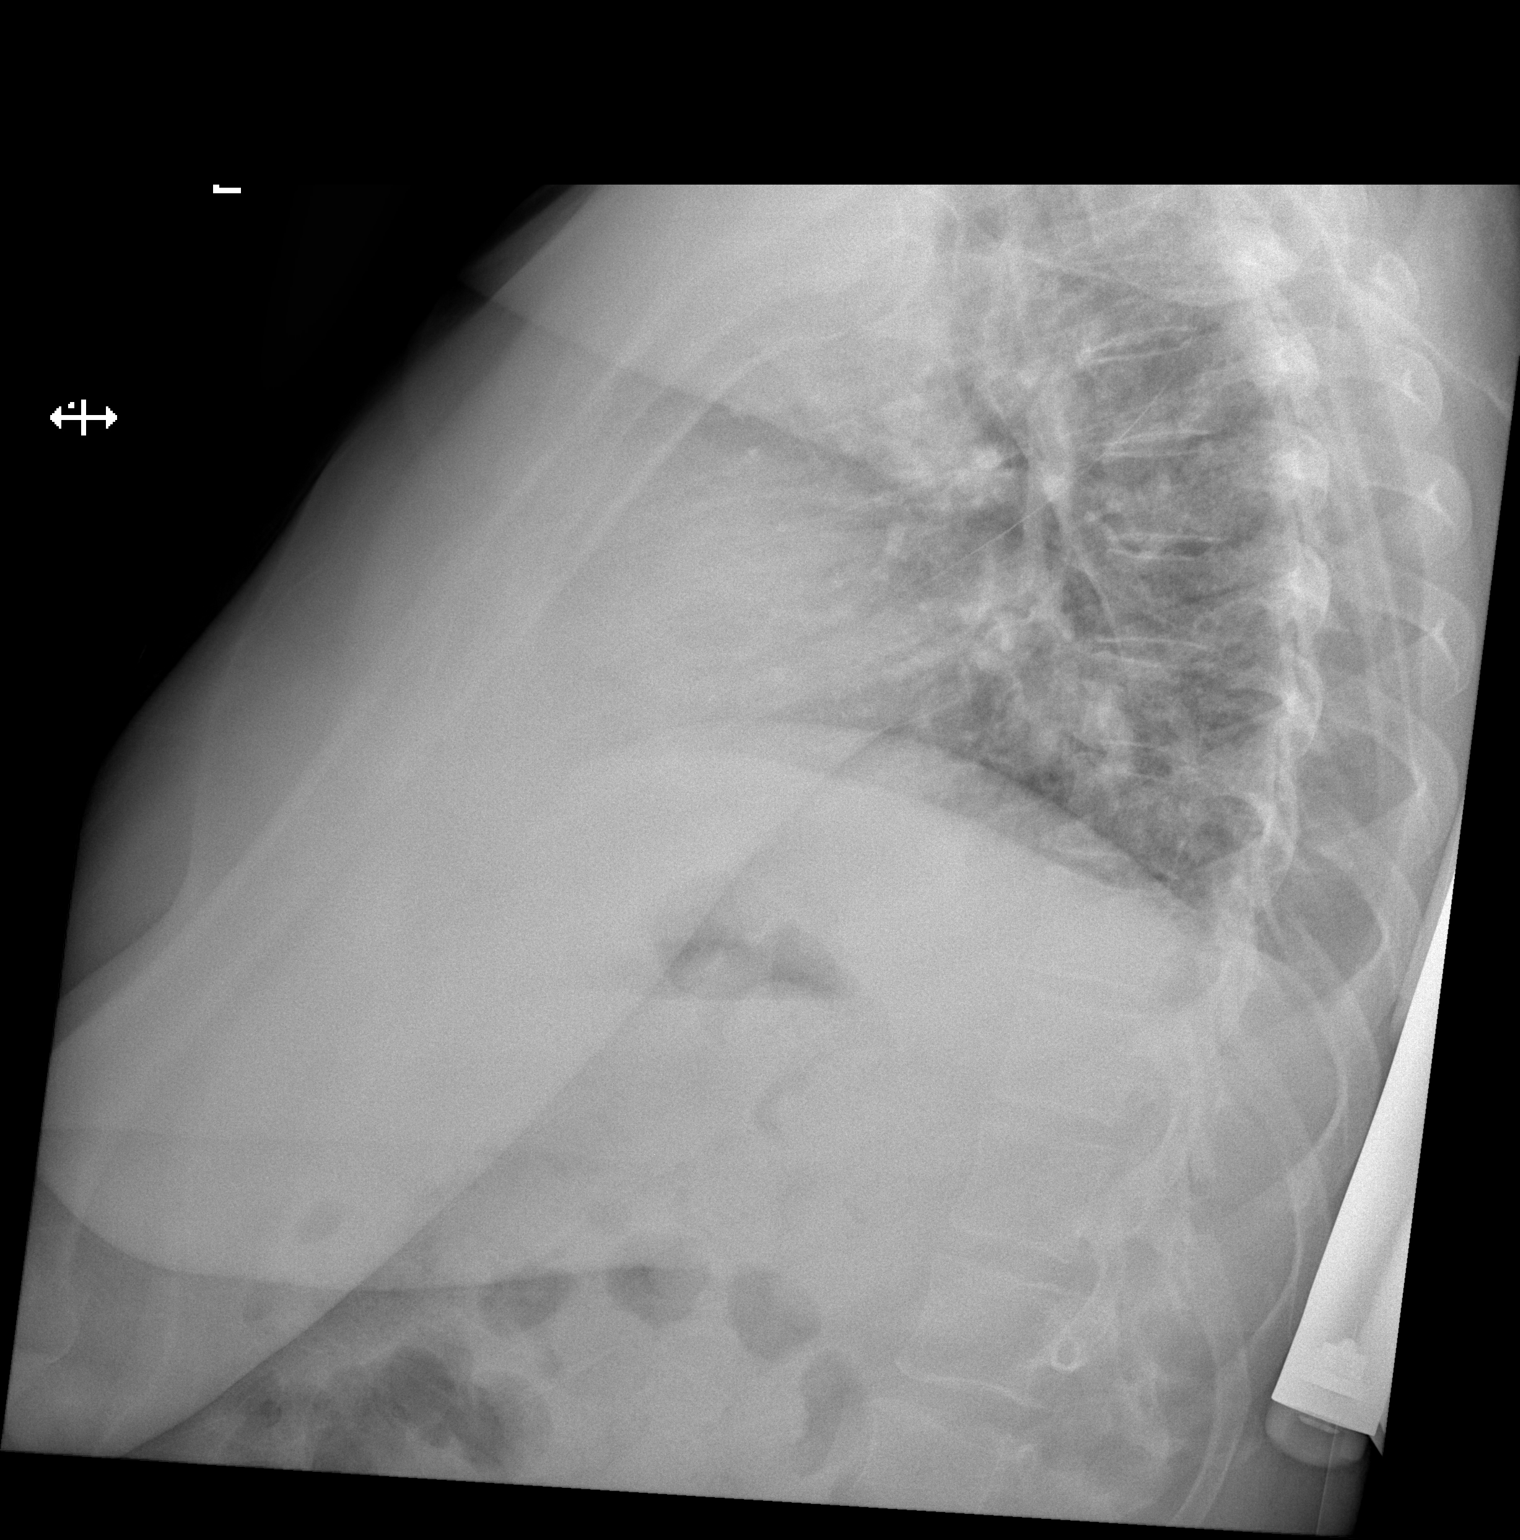

[x chest ap]
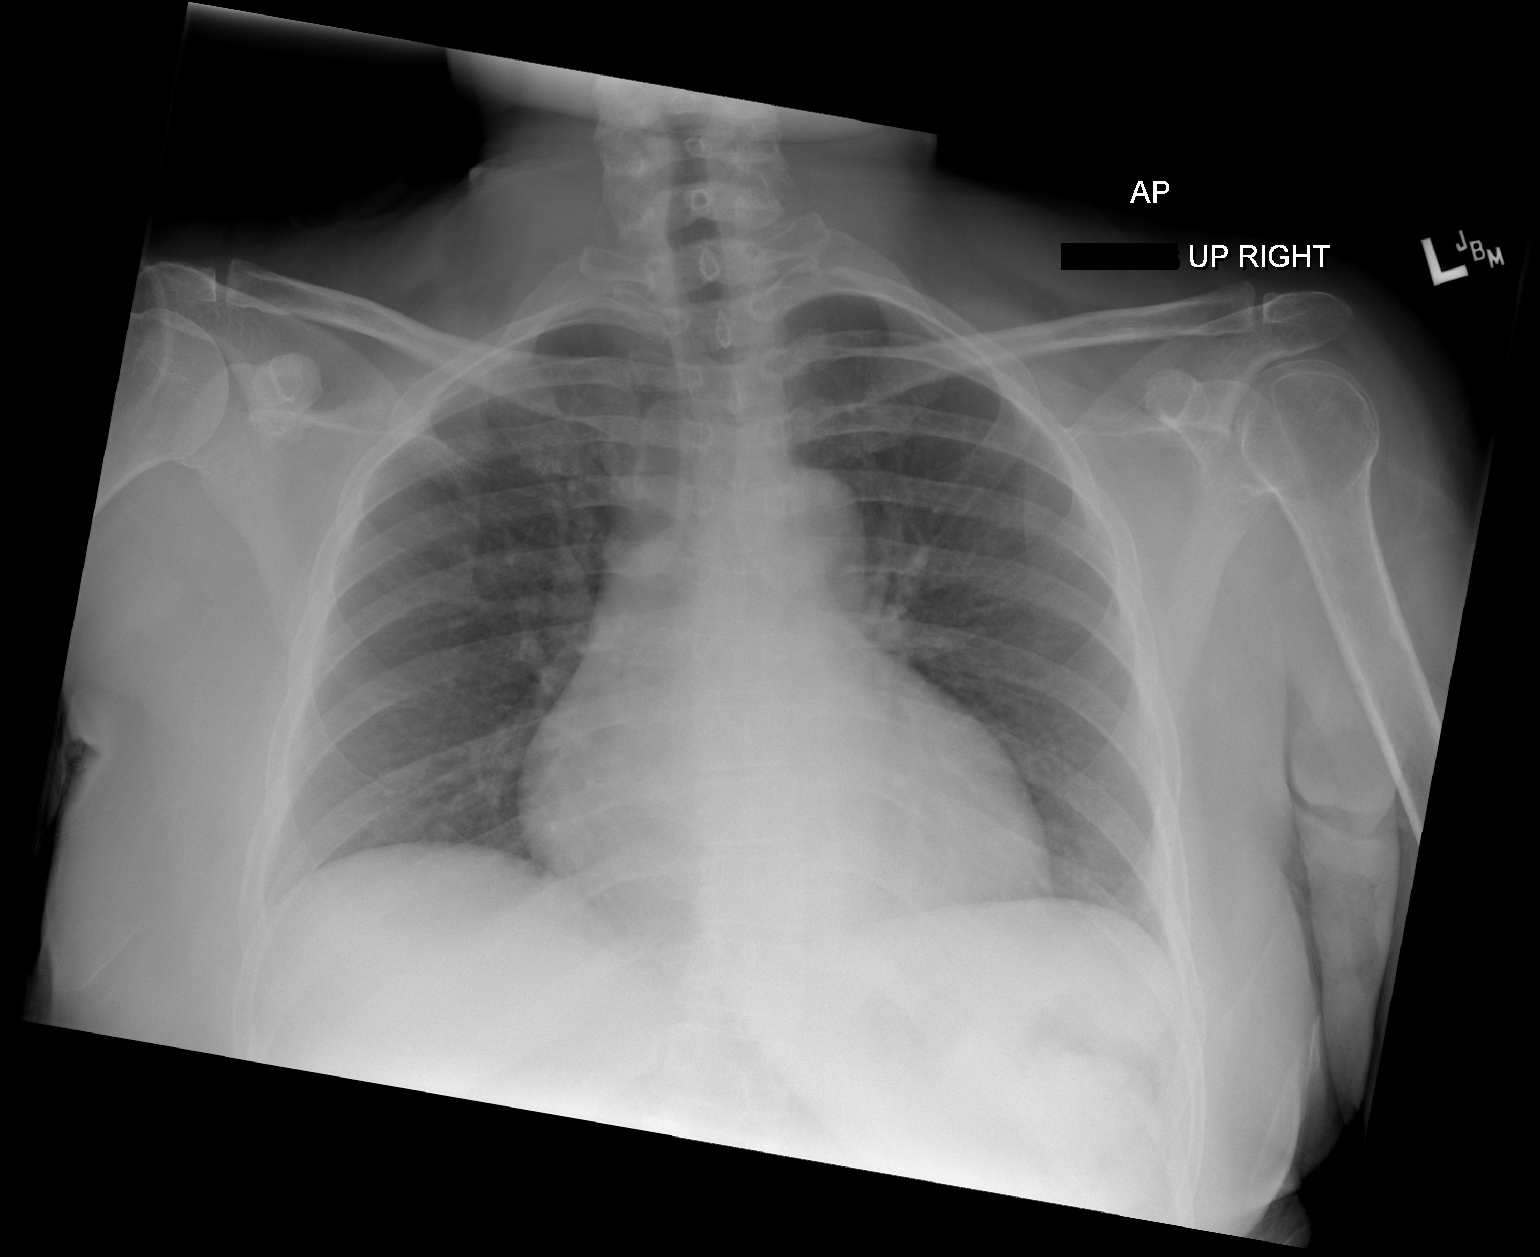

[2 of 2 positions shown; findings below may reference images not displayed]

FINDINGS: Mild cardiomegaly is stable. Lungs are clear. No pleural effusion or
pneumothorax seen. No osseous fracture or dislocation seen.
IMPRESSION: No acute findings. Lungs are clear. No osseous fracture or
dislocation seen. Stable mild cardiomegaly.

## 2017-10-04 MED ORDER — ACETAMINOPHEN 325 MG PO TABS
650.0000 mg | ORAL_TABLET | Freq: Once | ORAL | Status: AC
  Administered 2017-10-04: 650 mg via ORAL
  Filled 2017-10-04: qty 2

## 2017-10-04 NOTE — ED Provider Notes (Signed)
[place] EMERGENCY DEPARTMENT Provider Note   [name]: [id number] Arrival date & time: 10/04/17  1846     History   Chief Complaint Chief Complaint  Patient presents with  . Fall    HPI Abigail Campos is a 46 y.o. female w PMHx HTN, stroke w residual left sided weakness, presenting to the ED with left rib pain s/p mechanical fall that occurred on Saturday. Patient states she uses a cane with her right arm, and was in the bathroom on Saturday when she fell towards her left side, landing on her left arm and ribs. She states she did not hit her head or pass out, however has been having left-sided rib pain since the fall. No medications tried. She denies shortness of breath, difficulty breathing or cough, however endorses some pain with deep breathing. Denies neck or back pain, or any other complaints. States she is concerned for rib fracture.   The history is provided by the patient.    Past Medical History:  Diagnosis Date  . Essential hypertension 10/19/2007   03-2010: metoprolol changed to bystolic (was not feeling well on it, no specific allergy or reaction)    . Hyperlipemia   . Hypertension   . Stroke [facility])     Patient Active Problem List   Diagnosis Date Noted  . Hyperglycemia 12/29/2016  . Depression 12/29/2016  . Muscle spasticity   . Obesity (BMI 30.0-34.9) 04/08/2016  . Hyperlipidemia 04/08/2016  . Basal ganglia hemorrhage ([place]) 04/08/2016  . Dysphagia as late effect of cerebrovascular disease   . Migraine with aura and without status migrainosus, not intractable   . Gait disturbance, post-stroke   . Hemiplegia, post-stroke ([place])   . Aphasia, post-stroke   . Dysarthria, post-stroke   . ICH (intracerebral hemorrhage) ([facility]) - R basal ganglia due to hypertensive emergency 04/01/2016  . Upper airway cough syndrome 12/06/2015  . PCP NOTES >>> 09/25/2015  . Insomnia 12/28/2012  . Annual physical exam 11/23/2011  . Hemorrhoids 10/13/2011  .  Essential hypertension 10/19/2007    Past Surgical History:  Procedure Laterality Date  . ABDOMINAL HYSTERECTOMY  11-14-07   no oophorectomy per surgical report  . CESAREAN SECTION     [id number]  . INGUINAL HERNIA REPAIR     2004    OB History    Gravida Para Term Preterm AB Living   2 2           SAB TAB Ectopic Multiple Live Births                   Home Medications    Prior to Admission medications   Medication Sig Start Date End Date Taking? Authorizing Provider  atorvastatin (LIPITOR) 20 MG tablet Take 1 tablet (20 mg total) by mouth daily at 6 PM. 04/05/17   Colon Branch, MD  baclofen (LIORESAL) 10 MG tablet Take 1 tablet (10 mg total) by mouth 3 (three) times daily. 04/05/17   Colon Branch, MD  clopidogrel (PLAVIX) 75 MG tablet Take 1 tablet (75 mg total) by mouth daily. 08/17/17   Rosalin Hawking, MD  escitalopram (LEXAPRO) 10 MG tablet Take 1 tablet (10 mg total) by mouth daily. 09/21/17   Colon Branch, MD  furosemide (LASIX) 20 MG tablet Take 1 tablet (20 mg total) by mouth daily. 09/13/17   Colon Branch, MD  lisinopril (PRINIVIL,ZESTRIL) 2.5 MG tablet Take 1 tablet (2.5 mg total) by mouth daily. 04/05/17   Colon Branch,  MD  nebivolol (BYSTOLIC) 5 MG tablet Take 1 tablet (5 mg total) by mouth every 12 (twelve) hours. 09/13/17   Colon Branch, MD  potassium chloride (KLOR-CON 10) 10 MEQ tablet Take 1 tablet (10 mEq total) by mouth daily. 08/30/17   Colon Branch, MD    Family History Family History  Problem Relation Age of Onset  . Diabetes Mother   . Hypertension Mother   . Glaucoma Mother   . Colon cancer Mother        mother age 60  . Stroke Paternal Grandfather   . Heart disease Unknown        2 uncles died 24-Mar-2011 from CAD-CHF  . Stroke Maternal Grandfather   . Diabetes Other        siblings  . Hypertension Other        siblings    Social History Social History  Substance Use Topics  . Smoking status: Never Smoker  . Smokeless tobacco: Never Used  . Alcohol use No      Allergies   Bee venom; Peanut-containing drug products; Hydrocodone; Isovue [iopamidol]; Ambien [zolpidem tartrate]; Aspirin; and Latex   Review of Systems Review of Systems  Respiratory: Negative for cough and shortness of breath.   Musculoskeletal: Positive for myalgias (left ribs). Negative for back pain and neck pain.  Neurological: Negative for headaches.     Physical Exam Updated Vital Signs BP (!) 137/102 (BP Location: Right Arm)   Pulse 60   Temp 98.1 F (36.7 C) (Oral)   Resp 18   Ht 4\' 11"  (1.499 m)   Wt 65.8 kg (145 lb)   SpO2 100%   BMI 29.29 kg/m   Physical Exam  Constitutional: She appears well-developed and well-nourished. No distress.  HENT:  Head: Normocephalic and atraumatic.  Eyes: Conjunctivae are normal.  Neck: Normal range of motion.  Cardiovascular: Normal rate, regular rhythm, normal heart sounds and intact distal pulses.   Pulmonary/Chest: Effort normal and breath sounds normal. No respiratory distress. She has no wheezes. She has no rales.  No increased work of breathing, no flail chest, symmetric chest expansion. Left lateral ribs and ribs under left breast with some tenderness, no crepitus, ecchymosis or deformities.  Abdominal: Soft. Bowel sounds are normal.  Musculoskeletal:  L Shoulder without TTP and with normal range of motion. No spinal or paraspinal tenderness.  Psychiatric: She has a normal mood and affect. Her behavior is normal.  Nursing note and vitals reviewed.    ED Treatments / Results  Labs (all labs ordered are listed, but only abnormal results are displayed) Labs Reviewed - No data to display  EKG  EKG Interpretation None       Radiology Dg Chest 2 View  Result Date: 10/04/2017 CLINICAL DATA:  Pain under left breast after fall on Saturday. EXAM: CHEST  2 VIEW COMPARISON:  Chest x-ray dated 12/06/2015. FINDINGS: Mild cardiomegaly is stable. Lungs are clear. No pleural effusion or pneumothorax seen. No  osseous fracture or dislocation seen. IMPRESSION: No acute findings. Lungs are clear. No osseous fracture or dislocation seen. Stable mild cardiomegaly. Electronically Signed   By: Franki Cabot M.D.   On: 10/04/2017 21:20    Procedures Procedures (including critical care time)  Medications Ordered in ED Medications  acetaminophen (TYLENOL) tablet 650 mg (not administered)     Initial Impression / Assessment and Plan / ED Course  I have reviewed the triage vital signs and the nursing notes.  Pertinent labs & imaging results  that were available during my care of the patient were reviewed by me and considered in my medical decision making (see chart for details).     Patient with left rib pain status post mechanical fall. No increased work of breathing, shortness of breath, or respiratory distress. Lungs clear to auscultation bilaterally. Chest x-ray done in triage is negative. Discussed possibility of missed rib fracture. Discussed treatment is symptomatic. Recommend patient follow up with her primary care.  Patient take Tylenol for pain. Pt is safe for discharge.  Discussed results, findings, treatment and follow up. Patient advised of return precautions. Patient verbalized understanding and agreed with plan.   Final Clinical Impressions(s) / ED Diagnoses   Final diagnoses:  None    New Prescriptions New Prescriptions   No medications on file     Russo, [place] N, PA-C 10/04/17 2219    Tanna Furry, MD 10/22/17 2320

## 2017-10-04 NOTE — Discharge Instructions (Signed)
Please read instructions below. Apply ice to your ribs for 20 minutes at a time. You can take tylenol every 6 hours as needed for pain. Schedule an appointment with your primary care to follow-up on your injury. Return to the ER for difficulty breathing, coughing up blood, or new or concerning symptoms.

## 2017-10-04 NOTE — ED Notes (Signed)
Patient transported to X-ray 

## 2017-10-04 NOTE — ED Triage Notes (Signed)
Pt c/o pain under her left breast after a fall on Saturday. Denies shortness of breath/nausea/vomiting.

## 2017-10-07 ENCOUNTER — Other Ambulatory Visit: Payer: Self-pay | Admitting: Internal Medicine

## 2017-10-10 ENCOUNTER — Other Ambulatory Visit: Payer: Self-pay | Admitting: Internal Medicine

## 2017-10-11 NOTE — Telephone Encounter (Signed)
Ok #21, needs BMP

## 2017-10-11 NOTE — Telephone Encounter (Signed)
Pt is requesting refills on Klor-con 10 meq. Have been trying not to run Pt out, but she is due for BMP recheck from 07/2017. I have called and left messages, sent letter. Pt has appt scheduled on 11/02/2017. Please advise.

## 2017-10-18 ENCOUNTER — Other Ambulatory Visit: Payer: Self-pay | Admitting: Neurology

## 2017-10-23 ENCOUNTER — Other Ambulatory Visit: Payer: Self-pay | Admitting: Internal Medicine

## 2017-10-23 DIAGNOSIS — I610 Nontraumatic intracerebral hemorrhage in hemisphere, subcortical: Secondary | ICD-10-CM

## 2017-11-02 ENCOUNTER — Other Ambulatory Visit: Payer: Self-pay | Admitting: Internal Medicine

## 2017-11-02 ENCOUNTER — Encounter: Payer: Self-pay | Admitting: Internal Medicine

## 2017-11-02 ENCOUNTER — Ambulatory Visit: Payer: 59 | Admitting: Internal Medicine

## 2017-11-02 ENCOUNTER — Ambulatory Visit (INDEPENDENT_AMBULATORY_CARE_PROVIDER_SITE_OTHER): Payer: 59 | Admitting: Internal Medicine

## 2017-11-02 VITALS — BP 122/78 | HR 71 | Resp 14 | Ht 59.0 in | Wt 157.4 lb

## 2017-11-02 DIAGNOSIS — I69359 Hemiplegia and hemiparesis following cerebral infarction affecting unspecified side: Secondary | ICD-10-CM

## 2017-11-02 DIAGNOSIS — G4700 Insomnia, unspecified: Secondary | ICD-10-CM

## 2017-11-02 DIAGNOSIS — F329 Major depressive disorder, single episode, unspecified: Secondary | ICD-10-CM

## 2017-11-02 DIAGNOSIS — F32A Depression, unspecified: Secondary | ICD-10-CM

## 2017-11-02 DIAGNOSIS — I10 Essential (primary) hypertension: Secondary | ICD-10-CM | POA: Diagnosis not present

## 2017-11-02 DIAGNOSIS — Z23 Encounter for immunization: Secondary | ICD-10-CM

## 2017-11-02 DIAGNOSIS — I610 Nontraumatic intracerebral hemorrhage in hemisphere, subcortical: Secondary | ICD-10-CM

## 2017-11-02 LAB — BASIC METABOLIC PANEL
BUN: 10 mg/dL (ref 6–23)
CO2: 31 mEq/L (ref 19–32)
Calcium: 9.3 mg/dL (ref 8.4–10.5)
Chloride: 105 mEq/L (ref 96–112)
Creatinine, Ser: 0.73 mg/dL (ref 0.40–1.20)
GFR: 111.38 mL/min (ref >60.00)
Glucose, Bld: 130 mg/dL — ABNORMAL HIGH (ref 70–99)
Potassium: 3.7 mEq/L (ref 3.5–5.1)
Sodium: 140 mEq/L (ref 135–145)

## 2017-11-02 MED ORDER — TRAZODONE HCL 50 MG PO TABS: 50.0000 mg | ORAL_TABLET | Freq: Every day | ORAL | 1 refills | Status: DC

## 2017-11-02 MED ORDER — ESCITALOPRAM OXALATE 10 MG PO TABS
15.0000 mg | ORAL_TABLET | Freq: Every day | ORAL | 0 refills | Status: DC
Start: 2017-11-02 — End: 2017-12-17

## 2017-11-02 NOTE — Progress Notes (Signed)
Pre visit review using our clinic review tool, if applicable. No additional management support is needed unless otherwise documented below in the visit note. 

## 2017-11-02 NOTE — Progress Notes (Signed)
Subjective:    Patient ID: Abigail Campos, female    DOB: 1971/11/08, 46 y.o.   MRN: [id number]  DOS:  11/02/2017 Type of visit - description : rov Interval history: HTN: Good compliance with medication, ambulatory BPs in the 120s. Insomnia: Still an issue, requests treatment. Depression: On Lexapro, reportedly she still gets somewhat angry and upset "without reason".  She would like to see about increasing Lexapro dose. Had a fall 10/04/2017, went to the ER, x-ray negative.   Review of Systems No suicidal ideas  Past Medical History:  Diagnosis Date  . Essential hypertension 10/19/2007   03-2010: metoprolol changed to bystolic (was not feeling well on it, no specific allergy or reaction)    . Hyperlipemia   . Hypertension   . Stroke [facility])     Past Surgical History:  Procedure Laterality Date  . ABDOMINAL HYSTERECTOMY  11-14-07   no oophorectomy per surgical report  . CESAREAN SECTION     [id number]  . INGUINAL HERNIA REPAIR     2004    Social History   Socioeconomic History  . Marital status: Married    Spouse name: Not on file  . Number of children: 2  . Years of education: Not on file  . Highest education level: Not on file  Social Needs  . Financial resource strain: Not on file  . Food insecurity - worry: Not on file  . Food insecurity - inability: Not on file  . Transportation needs - medical: Not on file  . Transportation needs - non-medical: Not on file  Occupational History  . Occupation: TEACHER    Employer: [employer]  Tobacco Use  . Smoking status: Never Smoker  . Smokeless tobacco: Never Used  Substance and Sexual Activity  . Alcohol use: No  . Drug use: No  . Sexual activity: Not on file  Other Topics Concern  . Not on file  Social History Narrative   Used to leve independently, stroke 03-2016, d/c from rehab to his mother's house 04-2016.   1 child in college, another child w/ her       Allergies as of 11/02/2017      Reactions   Bee Venom Anaphylaxis   Peanut-containing Drug Products Anaphylaxis   Hydrocodone Hives   Generalize itching w/o rash   Isovue [iopamidol] Hives, Itching   Pt broke out with one facial hive.  Had itching on her right upper ant and posterior arm w/o evidence of hives.  Pt given water and 25mg  benadryl po.  We observed pt for 30 minutes before d/c.  J. Bohm     Ambien [zolpidem Tartrate] Other (See Comments)   forgetfulness   Aspirin Swelling   Reports blisters w/ ASA but pt reports is ok w/ Motrin, Advil, naproxen   Latex Hives      Medication List        Accurate as of 11/02/17  5:52 PM. Always use your most recent med list.          atorvastatin 20 MG tablet Commonly known as:  LIPITOR Take 1 tablet (20 mg total) by mouth daily at 6 PM.   baclofen 10 MG tablet Commonly known as:  LIORESAL Take 1 tablet (10 mg total) 3 (three) times daily by mouth.   clopidogrel 75 MG tablet Commonly known as:  PLAVIX Take 1 tablet (75 mg total) by mouth daily.   escitalopram 10 MG tablet Commonly known as:  LEXAPRO Take 1.5 tablets (15 mg total)  by mouth daily.   furosemide 20 MG tablet Commonly known as:  LASIX Take 1 tablet (20 mg total) by mouth daily.   lisinopril 2.5 MG tablet Commonly known as:  PRINIVIL,ZESTRIL Take 1 tablet (2.5 mg total) by mouth daily.   nebivolol 5 MG tablet Commonly known as:  BYSTOLIC Take 1 tablet (5 mg total) by mouth every 12 (twelve) hours.   traZODone 50 MG tablet Commonly known as:  DESYREL Take 1 tablet (50 mg total) by mouth at bedtime.          Objective:   Physical Exam BP 122/78 (BP Location: Right Arm, Patient Position: Sitting, Cuff Size: Small)   Pulse 71   Resp 14   Ht 4\' 11"  (1.499 m)   Wt 157 lb 6 oz (71.4 kg)   SpO2 97%   BMI 31.79 kg/m  General:   Well developed, well nourished . NAD.  HEENT:  Normocephalic . Face symmetric, atraumatic Lungs:  CTA B Normal respiratory effort, no intercostal retractions, no  accessory muscle use. Heart: RRR,  no murmur.  No pretibial edema bilaterally  Skin: Not pale. Not jaundice Neurologic:  alert & oriented X3.  Speech slightly impaired , gait limited, assisted by a device.  Left-sided neck weakness noted.  Seems at baseline Psych--  Cognition and judgment appear intact.  Cooperative with normal attention span and concentration.  Behavior appropriate. No anxious or depressed appearing.      Assessment & Plan:    Assessment   Prediabetes: A1c 6.0 (April 2017)  HTN Hyperlipidemia Depression, insomnia GERD Stroke 03-2016:  ICH, right basilar ganglia due to HTN emergency, + encephalopathy, + dysphagia, aphasia, dysarthria Residual L spasticity.  CTA neck 03-2016 neg CTA head 10-17 (-) aneurysm  Headaches , migraines dx after 2nd pregnancy, (-) CT head 2014 and 2015 Insomnia (did like  Cough, persisting, saw Dr. Melvyn Novas 11-2015  PLAN: HTN: BP seems to be well controlled on Lasix, lisinopril and Bystolic.  Last potassium was low not take supplements.  Check a BMP. Depression: On Lexapro 10 mg; on one hand she still is somewhat sad and gets angry easily, on the other hand her emotions are flat sometimes (ie she does not get too happy or too sad w/ anything).  We agreed to increase Lexapro to 15 mg; goal is to decrease her depression w/o flattening more her mood .  Reassess in 6 weeks. Insomnia: Unable to sleep more than 5 or 6 hours.  Previously disliked Ambien. Trial w/ Trazodone 50 mg.  Warned about excessive somnolence Stroke: Desires to see neurology again, referral sent RTC 6 weeks  Today, I spent more than 28   min with the patient: >50% of the time counseling regards depression, insomnia, pros and cons of increase Lexapro or switch to another medication.  Also discussing  side effects of insomnia medication.

## 2017-11-02 NOTE — Telephone Encounter (Signed)
Rxs sent

## 2017-11-02 NOTE — Assessment & Plan Note (Signed)
HTN: BP seems to be well controlled on Lasix, lisinopril and Bystolic.  Last potassium was low not take supplements.  Check a BMP. Depression: On Lexapro 10 mg; on one hand she still is somewhat sad and gets angry easily, on the other hand her emotions are flat sometimes (ie she does not get too happy or too sad w/ anything).  We agreed to increase Lexapro to 15 mg; goal is to decrease her depression w/o flattening more her mood .  Reassess in 6 weeks. Insomnia: Unable to sleep more than 5 or 6 hours.  Previously disliked Ambien. Trial w/ Trazodone 50 mg.  Warned about excessive somnolence Stroke: Desires to see neurology again, referral sent RTC 6 weeks

## 2017-11-02 NOTE — Patient Instructions (Addendum)
GO TO THE LAB : Get the blood work     GO TO THE FRONT DESK Schedule your next appointment for a checkup in 6 weeks  Increase Lexapro 10 mg to 1.5 tablets daily  Start trazodone 1 tablet at bedtime to help his sleep. Watch for excessive somnolence

## 2017-11-02 NOTE — Telephone Encounter (Signed)
Copied from [place] [id number]. Topic: Quick Communication - See Telephone Encounter >> Nov 02, 2017  4:14 PM Vernona Rieger wrote: CRM for notification. See Telephone encounter for:  Patient said she was in the office today & forgot to ask for refills on her LIPITOR 20mg  & LISINOPRIL 2.5mg  & she said she is going out of town tomorrow & would like to know if she could get this taken care of before then. [facility] [address].  11/02/17.

## 2017-11-05 ENCOUNTER — Other Ambulatory Visit: Payer: Self-pay | Admitting: Neurology

## 2017-11-19 ENCOUNTER — Other Ambulatory Visit: Payer: Self-pay | Admitting: Neurology

## 2017-11-19 ENCOUNTER — Other Ambulatory Visit: Payer: Self-pay | Admitting: Internal Medicine

## 2017-12-17 ENCOUNTER — Ambulatory Visit (INDEPENDENT_AMBULATORY_CARE_PROVIDER_SITE_OTHER): Payer: Medicaid Other | Admitting: Internal Medicine

## 2017-12-17 ENCOUNTER — Encounter: Payer: Self-pay | Admitting: Internal Medicine

## 2017-12-17 VITALS — BP 132/80 | HR 62 | Temp 98.4°F | Resp 14 | Ht 59.0 in | Wt 157.0 lb

## 2017-12-17 DIAGNOSIS — I10 Essential (primary) hypertension: Secondary | ICD-10-CM

## 2017-12-17 DIAGNOSIS — I610 Nontraumatic intracerebral hemorrhage in hemisphere, subcortical: Secondary | ICD-10-CM | POA: Diagnosis not present

## 2017-12-17 DIAGNOSIS — G4700 Insomnia, unspecified: Secondary | ICD-10-CM

## 2017-12-17 DIAGNOSIS — F329 Major depressive disorder, single episode, unspecified: Secondary | ICD-10-CM

## 2017-12-17 DIAGNOSIS — F32A Depression, unspecified: Secondary | ICD-10-CM

## 2017-12-17 MED ORDER — CLOPIDOGREL BISULFATE 75 MG PO TABS: 75.0000 mg | ORAL_TABLET | Freq: Every day | ORAL | 1 refills | Status: DC

## 2017-12-17 MED ORDER — BACLOFEN 10 MG PO TABS: 10.0000 mg | ORAL_TABLET | Freq: Three times a day (TID) | ORAL | 1 refills | Status: DC

## 2017-12-17 MED ORDER — ATORVASTATIN CALCIUM 20 MG PO TABS: 20.0000 mg | ORAL_TABLET | Freq: Every day | ORAL | 1 refills | Status: DC

## 2017-12-17 MED ORDER — FUROSEMIDE 20 MG PO TABS: 20.0000 mg | ORAL_TABLET | Freq: Every day | ORAL | 1 refills | Status: DC

## 2017-12-17 MED ORDER — TRAZODONE HCL 50 MG PO TABS: 50.0000 mg | ORAL_TABLET | Freq: Every day | ORAL | 1 refills | Status: DC

## 2017-12-17 MED ORDER — ESCITALOPRAM OXALATE 10 MG PO TABS: 15.0000 mg | ORAL_TABLET | Freq: Every day | ORAL | 1 refills | Status: DC

## 2017-12-17 MED ORDER — LISINOPRIL 2.5 MG PO TABS: 2.5000 mg | ORAL_TABLET | Freq: Every day | ORAL | 1 refills | Status: DC

## 2017-12-17 MED ORDER — NEBIVOLOL HCL 5 MG PO TABS: 5.0000 mg | ORAL_TABLET | Freq: Two times a day (BID) | ORAL | 1 refills | Status: DC

## 2017-12-17 NOTE — Progress Notes (Signed)
Pre visit review using our clinic review tool, if applicable. No additional management support is needed unless otherwise documented below in the visit note. 

## 2017-12-17 NOTE — Progress Notes (Signed)
Subjective:    Patient ID: Abigail Campos, female    DOB: 07-Mar-1971, 47 y.o.   MRN: [id number]  DOS:  12/17/2017 Type of visit - description : f/u Interval history:  depression: Patient increased Lexapro, good results, symptoms better control, denies feeling flat affect. HTN: Ambulatory BPs normal Insomnia: On trazodone, improved Stroke: To see neurology in few days  Review of Systems  Had a UTI, on Macrobid, empirically.  Feeling better. Denies fever chills No dysuria or gross hematuria  Past Medical History:  Diagnosis Date  . Essential hypertension 10/19/2007   03-2010: metoprolol changed to bystolic (was not feeling well on it, no specific allergy or reaction)    . Hyperlipemia   . Hypertension   . Stroke [facility])     Past Surgical History:  Procedure Laterality Date  . ABDOMINAL HYSTERECTOMY  11-14-07   no oophorectomy per surgical report  . CESAREAN SECTION     [id number]  . INGUINAL HERNIA REPAIR     2004    Social History   Socioeconomic History  . Marital status: Married    Spouse name: Not on file  . Number of children: 2  . Years of education: Not on file  . Highest education level: Not on file  Social Needs  . Financial resource strain: Not on file  . Food insecurity - worry: Not on file  . Food insecurity - inability: Not on file  . Transportation needs - medical: Not on file  . Transportation needs - non-medical: Not on file  Occupational History  . Occupation: TEACHER    Employer: [employer]  Tobacco Use  . Smoking status: Never Smoker  . Smokeless tobacco: Never Used  Substance and Sexual Activity  . Alcohol use: No  . Drug use: No  . Sexual activity: Not on file  Other Topics Concern  . Not on file  Social History Narrative   Used to leve independently, stroke 03-2016, d/c from rehab to his mother's house 04-2016.   1 child in college, another child w/ her       Allergies as of 12/17/2017      Reactions   Bee Venom Anaphylaxis   Peanut-containing Drug Products Anaphylaxis   Hydrocodone Hives   Generalize itching w/o rash   Isovue [iopamidol] Hives, Itching   Pt broke out with one facial hive.  Had itching on her right upper ant and posterior arm w/o evidence of hives.  Pt given water and 25mg  benadryl po.  We observed pt for 30 minutes before d/c.  J. Bohm     Ambien [zolpidem Tartrate] Other (See Comments)   forgetfulness   Aspirin Swelling   Reports blisters w/ ASA but pt reports is ok w/ Motrin, Advil, naproxen   Latex Hives      Medication List        Accurate as of 12/17/17 11:59 PM. Always use your most recent med list.          atorvastatin 20 MG tablet Commonly known as:  LIPITOR Take 1 tablet (20 mg total) by mouth daily at 6 PM.   baclofen 10 MG tablet Commonly known as:  LIORESAL Take 1 tablet (10 mg total) by mouth 3 (three) times daily.   clopidogrel 75 MG tablet Commonly known as:  PLAVIX Take 1 tablet (75 mg total) by mouth daily.   escitalopram 10 MG tablet Commonly known as:  LEXAPRO Take 1.5 tablets (15 mg total) by mouth daily.   furosemide 20  MG tablet Commonly known as:  LASIX Take 1 tablet (20 mg total) by mouth daily.   lisinopril 2.5 MG tablet Commonly known as:  PRINIVIL,ZESTRIL Take 1 tablet (2.5 mg total) by mouth daily.   nebivolol 5 MG tablet Commonly known as:  BYSTOLIC Take 1 tablet (5 mg total) by mouth every 12 (twelve) hours.   traZODone 50 MG tablet Commonly known as:  DESYREL Take 1 tablet (50 mg total) by mouth at bedtime.          Objective:   Physical Exam BP 132/80 (BP Location: Right Arm, Patient Position: Sitting, Cuff Size: Small)   Pulse 62   Temp 98.4 F (36.9 C) (Oral)   Resp 14   Ht 4\' 11"  (1.499 m)   Wt 157 lb (71.2 kg)   SpO2 97%   BMI 31.71 kg/m  General:   Well developed, well nourished . NAD.  HEENT:  Normocephalic . Face symmetric, atraumatic  Neurologic:  alert & oriented X3. at baseline Psych--  Cognition and  judgment appear intact.  Cooperative with normal attention span and concentration.  Behavior appropriate. No anxious or depressed appearing.  She seems to be in good spirits today.     Assessment & Plan:   Assessment   Prediabetes: A1c 6.0 (April 2017)  HTN Hyperlipidemia Depression, insomnia GERD Stroke 03-2016:  ICH, right basilar ganglia due to HTN emergency, + encephalopathy, + dysphagia, aphasia, dysarthria Residual L spasticity.  CTA neck 03-2016 neg CTA head 10-17 (-) aneurysm Headaches , migraines dx after 2nd pregnancy, (-) CT head 2014 and 2015 Insomnia   Cough, persisting, saw Dr. Melvyn Novas 11-2015  PLAN: HTN: Last BMP satisfactory, continue Lasix, lisinopril, Bystolic. Depression: See last visit, Lexapro increased to 15 mg, mood improved, does not feel "flat".  Refill medications as needed. Insomnia: See last visit, on trazodone, typically sleeps from 9 PM to 3 AM, then watch TV and go back to bed from 8 AM to 10 AM.  She feels satisfied with this "routine".  No change. UTI: Treated elsewhere with empiric Macrobid.  Sxs improving. RTC  4 months, fasting CPX

## 2017-12-17 NOTE — Patient Instructions (Signed)
  GO TO THE FRONT DESK Schedule your next appointment for a physical exam, fasting in   4 months

## 2017-12-19 NOTE — Assessment & Plan Note (Signed)
HTN: Last BMP satisfactory, continue Lasix, lisinopril, Bystolic. Depression: See last visit, Lexapro increased to 15 mg, mood improved, does not feel "flat".  Refill medications as needed. Insomnia: See last visit, on trazodone, typically sleeps from 9 PM to 3 AM, then watch TV and go back to bed from 8 AM to 10 AM.  She feels satisfied with this "routine".  No change. UTI: Treated elsewhere with empiric Macrobid.  Sxs improving. RTC  4 months, fasting CPX

## 2017-12-20 ENCOUNTER — Other Ambulatory Visit: Payer: Self-pay

## 2017-12-20 MED ORDER — TRAZODONE HCL 50 MG PO TABS: 50.0000 mg | ORAL_TABLET | Freq: Every day | ORAL | 0 refills | Status: DC

## 2017-12-24 ENCOUNTER — Telehealth: Payer: Self-pay | Admitting: Internal Medicine

## 2017-12-24 NOTE — Telephone Encounter (Signed)
Copied from [place] [id number]. Topic: Quick Communication - See Telephone Encounter >> Dec 24, 2017  9:00 AM Ahmed Prima L wrote: CRM for notification. See Telephone encounter for:   12/24/17.  Patient said she went to the pharmacy to pick up her nebivolol (BYSTOLIC) 5 MG tablet & it was going to be around $1000. She said she needs Dr Larose Kells to approve this.  [id number] [place], [place].

## 2017-12-24 NOTE — Telephone Encounter (Signed)
Will call Medicaid to initiate PA shortly.

## 2017-12-24 NOTE — Telephone Encounter (Signed)
Chart reviewed an PA is actually on file through Medicaid until 03/27/2018 for a 30 day supply only. Spoke w/ Ebony Hail at [place]- she re-ran the medication as 30 day supply and was approved. She will inform Pt that Rx is being prepared and that only a 30 day can be filled through Medicaid.

## 2018-01-04 ENCOUNTER — Encounter: Payer: Self-pay | Admitting: Neurology

## 2018-01-04 ENCOUNTER — Ambulatory Visit: Payer: Medicaid Other | Admitting: Neurology

## 2018-01-04 VITALS — BP 114/68 | HR 76 | Wt 169.4 lb

## 2018-01-04 DIAGNOSIS — E785 Hyperlipidemia, unspecified: Secondary | ICD-10-CM | POA: Diagnosis not present

## 2018-01-04 DIAGNOSIS — G8110 Spastic hemiplegia affecting unspecified side: Secondary | ICD-10-CM | POA: Diagnosis not present

## 2018-01-04 DIAGNOSIS — I10 Essential (primary) hypertension: Secondary | ICD-10-CM

## 2018-01-04 DIAGNOSIS — I610 Nontraumatic intracerebral hemorrhage in hemisphere, subcortical: Secondary | ICD-10-CM

## 2018-01-04 NOTE — Progress Notes (Signed)
STROKE NEUROLOGY FOLLOW UP NOTE  NAME: Abigail Campos DOB: May 13, 1971  REASON FOR VISIT: stroke follow up HISTORY FROM: pt and mom and sister  Today we had the pleasure of seeing Abigail Campos in follow-up at our Neurology Clinic. Pt was accompanied by mom and sister.   History Summary Abigail Campos is a 47 y.o. Left handed female with history of HA and HTN was admitted on 04/08/16 for severe HA and L sided weakness. CT showed a R BG hemorrhage. Repeat CT showed stable hematoma. CTA head and neck showed no AVM or aneurysm. TTE EF 60-65%. LDL 113 and A1C 6.0. BP was high on cleviprex initially but later controlled by multiple BP meds. She still has significant left hemiplegia. She was discharged to CIR after stabilization.   Follow up 06/24/16 - the patient has been doing fair. Stayed in CIR for 4 weeks and discharged home. Currently has home PT/OT/speech twice a week, however, pt still has left hemiplegia, not able to move. She also follows with Dr. Posey Pronto and mentioned botox injection but not started yet. BP in good control at home today 127/94. She is currently applying for social security.   Follow-up 09/30/16 - the patient has been doing the same. Still has left hemiplegia, following with Dr. Posey Pronto. On her last visit with Dr. Posey Pronto on 08/13/2016, her Prozac was discontinued. Patient stated that last weekend she had two crying episodes without clear reason. She has not had any more episodes since then. She thought that was due to discontinuation of Prozac, and she would like Prozac to be resumed. I told her the episodes occurred 1.5 months after discontinuation of medication, likely unrelated. But she can further discuss with Dr. Posey Pronto in 1 week at next clinical visit. BP 130/86, currently on Lasix, lisinopril, and bystolic.   Interval History During the interval time, patient has lost follow-up in this clinic.  Had last therapy session in 01/2017 with home therapy, currently  doing self exercises at home.  Also last follow-up with Dr. Posey Pronto in [place] since 12/2016.  Patient still has left hemiparesis, walking with semi-walker.  Sister is having wedding ceremony in 10/2018, she would like to have further improvement on the left hemiparesis before sister's wedding.  BP today 114/68.  REVIEW OF SYSTEMS: Full 14 system review of systems performed and notable only for those listed below and in HPI above, all others are negative:  Constitutional:   Cardiovascular:  Ear/Nose/Throat:   Skin:  Eyes:  Respiratory:   Gastroitestinal:   Genitourinary:  Hematology/Lymphatic:   Endocrine:  Musculoskeletal:  Allergy/Immunology:   Neurological:  weakness Psychiatric:  Sleep:   The following represents the patient's updated allergies and side effects list: Allergies  Allergen Reactions  . Bee Venom Anaphylaxis  . Peanut-Containing Drug Products Anaphylaxis  . Hydrocodone Hives    Generalize itching w/o rash  . Isovue [Iopamidol] Hives and Itching    Pt broke out with one facial hive.  Had itching on her right upper ant and posterior arm w/o evidence of hives.  Pt given water and 25mg  benadryl po.  We observed pt for 30 minutes before d/c.  J. Bohm    . Ambien [Zolpidem Tartrate] Other (See Comments)    forgetfulness  . Aspirin Swelling    Reports blisters w/ ASA but pt reports is ok w/ Motrin, Advil, naproxen  . Latex Hives    The neurologically relevant items on the patient's problem list were reviewed on today's visit.  Neurologic Examination  A problem focused neurological exam (12 or more points of the single system neurologic examination, vital signs counts as 1 point, cranial nerves count for 8 points) was performed.  Blood pressure 114/68, pulse 76, weight 169 lb 6.4 oz (76.8 kg).  General - Well nourished, well developed, in no apparent distress.  Ophthalmologic - Fundi not visualized due to eye movement.  Cardiovascular - Regular rate and  rhythm.  Mental Status -  Level of arousal and orientation to time, place, and person were intact. Language including expression, naming, repetition, comprehension was assessed and found intact. Fund of Knowledge was assessed and was intact.  Cranial Nerves II - XII - II - Visual field intact OU. III, IV, VI - Extraocular movements intact. V - Facial sensation intact bilaterally. VII - left facial droop. VIII - Hearing & vestibular intact bilaterally. X - Palate elevates symmetrically. XI - Chin turning & shoulder shrug intact bilaterally. XII - Tongue protrusion intact.  Motor Strength - The patient's strength was normal in RUE and RLE, but 2/5 deltoid, 2/5 bicep, 0/5 tricep, 2/5 hand grip, 4/5 proximal LLE and 0/5 distally, LLE in brace. Bulk was normal and fasciculations were absent.   Motor Tone - Muscle tone was assessed at the neck and appendages and was mildly increased on the left UE and LE.   Reflexes - The patient's reflexes were 1+ in all extremities and she had no pathological reflexes.  Sensory - Light touch, temperature/pinprick were assessed and were decreased on the left, 50% comparing with right    Coordination - The patient had normal movements in the right hand with no ataxia or dysmetria.  Tremor was absent.  Gait and Station - on semi-walker, left hemipretic gait.   Data reviewed: I personally reviewed the images and agree with the radiology interpretations.  Ct Head Wo Contrast 04/04/2016 IMPRESSION: 1. Stable right lentiform nucleus hemorrhage with slightly increased localized vasogenic edema. Localized mass effect with 4 mm right-to-left shift not significantly changed. 2. Decreasing small volume acute subarachnoid hemorrhage. 04/02/2016 IMPRESSION: Stable RIGHT putaminal intracerebral hemorrhage, favored to represent a hypertensive related event.  04/01/2016 IMPRESSION: Intraparenchymal hemorrhage arising from the posterior, lateral right basal ganglia  with subarachnoid hemorrhage extending into the sylvian fissure on the right. There is mass effect with mild midline shift toward the left of approximately 7 mm. Elsewhere gray-white compartments normal. Mild paranasal sinus disease in the maxillary antra bilaterally. Volume of hemorrhage in the right basal ganglia region is approximately 26 cubic cm. Note that there is additional hemorrhage in the subarachnoid space on the right which cannot be easily quantified with respect to specific volume of hemorrhage.   Ct Angio Head and neck W/cm &/or Wo Cm 04/02/2016 IMPRESSION: 1. Negative arterial findings on CTA head and neck, aside from mild calcified atherosclerosis of the left ICA siphon. 2. Stable hyperdense intra-axial hemorrhage in the right hemisphere since yesterday. Negative CTA Spot Sign, and no evidence of associated vascular malformation. Estimated blood volume of 21 mL. Mildly increased surrounding edema, but stable mild regional mass effect. 3. Poor dentition.   2D echocardiogram  Normal LV size and systolic function, EF 14-48%. Mild LV hypertrophy. Moderate diastolic dysfunction. Normal RV size and systolic function. No significant valvular abnormalities.  Component     Latest Ref Rng 04/04/2016 05/18/2016  Cholesterol     0 - 200 mg/dL 169 102  Triglycerides     0.0 - 149.0 mg/dL 94 98.0  HDL Cholesterol     >  39.00 mg/dL 37 (L) 30.60 (L)  VLDL     0.0 - 40.0 mg/dL 19 19.6  LDL (calc)     0 - 99 mg/dL 113 (H) 52  Total CHOL/HDL Ratio      4.6 3  NonHDL       71.88  Hemoglobin A1C     4.8 - 5.6 % 6.0 (H)   Mean Plasma Glucose      126   TSH     0.350 - 4.500 uIU/mL 0.744   Vitamin B12     180 - 914 pg/mL 280   RPR     Non Reactive Non Reactive   HIV     Non Reactive Non Reactive     Assessment: As you may recall, she is a 47 y.o. African American female with PMH of HA and HTN was admitted on 04/08/16 for severe HA and L sided weakness. CT showed a R BG hemorrhage. CTA  head and neck showed no AVM or aneurysm. TTE EF 60-65%. LDL 113 and A1C 6.0. Currently has home PT/OT/speech twice a week, however, pt still has significant left hemiplegia. BP in good control at home with 3 BP meds. ASA allergy, on plavix for stroke prevention. With the resolution of hematoma, repeat CTA head ruled out AVM. Finished therapy in 01/2017 and lost follow up with PMR and neurology for more than a year. Discussed with her regarding further therapy options but only with very limited improvement potential.    Plan:  - continue lipitor and plavix for stroke prevention - will refer back to Dr. Posey Pronto for rehab follow up - Follow up with your primary care physician for stroke risk factor modification. Recommend maintain blood pressure goal <130/80, diabetes with hemoglobin A1c goal below 7.0% and lipids with LDL cholesterol goal below 70 mg/dL.  - check BP at home and record - will refer to PT/OT for further therapy   - follow up in 6 months with Hoyle Sauer.  I spent more than 25 minutes of face to face time with the patient. Greater than 50% of time was spent in counseling and coordination of care. We discussed about follow up with PMR Dr. Posey Pronto, and refer to PT/OT.   Orders Placed This Encounter  Procedures  . Ambulatory referral to Physical Medicine Rehab    Referral Priority:   Routine    Referral Type:   Rehabilitation    Referral Reason:   Specialty Services Required    Referred to Provider:   Jamse Arn, MD    Requested Specialty:   Physical Medicine and Rehabilitation    Number of Visits Requested:   1  . Ambulatory referral to Physical Therapy    Referral Priority:   Routine    Referral Type:   Physical Medicine    Referral Reason:   Specialty Services Required    Requested Specialty:   Physical Therapy    Number of Visits Requested:   1  . Ambulatory referral to Occupational Therapy    Referral Priority:   Routine    Referral Type:   Occupational Therapy    Referral  Reason:   Specialty Services Required    Requested Specialty:   Occupational Therapy    Number of Visits Requested:   1    No orders of the defined types were placed in this encounter.   Patient Instructions  - continue lipitor and plavix for stroke prevention - will refer to Dr. Posey Pronto for rehab follow up -  Follow up with your primary care physician for stroke risk factor modification. Recommend maintain blood pressure goal <130/80, diabetes with hemoglobin A1c goal below 7.0% and lipids with LDL cholesterol goal below 70 mg/dL.  - check BP at home and record - will refer to PT/OT for therapy   - follow up in 6 months.   Rosalin Hawking, MD PhD [facility] [address] [place], [place] [address] [phone]

## 2018-01-04 NOTE — Patient Instructions (Signed)
-   continue lipitor and plavix for stroke prevention - will refer to Dr. Posey Pronto for rehab follow up - Follow up with your primary care physician for stroke risk factor modification. Recommend maintain blood pressure goal <130/80, diabetes with hemoglobin A1c goal below 7.0% and lipids with LDL cholesterol goal below 70 mg/dL.  - check BP at home and record - will refer to PT/OT for therapy   - follow up in 6 months.

## 2018-01-12 ENCOUNTER — Encounter: Payer: Medicaid Other | Admitting: Physical Medicine & Rehabilitation

## 2018-01-13 ENCOUNTER — Encounter: Payer: Medicaid Other | Admitting: Physical Medicine & Rehabilitation

## 2018-01-14 ENCOUNTER — Encounter: Payer: 59 | Attending: Physical Medicine & Rehabilitation | Admitting: Physical Medicine & Rehabilitation

## 2018-01-14 ENCOUNTER — Encounter: Payer: Self-pay | Admitting: Physical Medicine & Rehabilitation

## 2018-01-14 VITALS — BP 136/90 | HR 58 | Resp 14

## 2018-01-14 DIAGNOSIS — R58 Hemorrhage, not elsewhere classified: Secondary | ICD-10-CM | POA: Diagnosis not present

## 2018-01-14 DIAGNOSIS — G43909 Migraine, unspecified, not intractable, without status migrainosus: Secondary | ICD-10-CM | POA: Diagnosis not present

## 2018-01-14 DIAGNOSIS — I10 Essential (primary) hypertension: Secondary | ICD-10-CM | POA: Insufficient documentation

## 2018-01-14 DIAGNOSIS — I69254 Hemiplegia and hemiparesis following other nontraumatic intracranial hemorrhage affecting left non-dominant side: Secondary | ICD-10-CM | POA: Insufficient documentation

## 2018-01-14 DIAGNOSIS — E785 Hyperlipidemia, unspecified: Secondary | ICD-10-CM | POA: Diagnosis not present

## 2018-01-14 DIAGNOSIS — I69154 Hemiplegia and hemiparesis following nontraumatic intracerebral hemorrhage affecting left non-dominant side: Secondary | ICD-10-CM

## 2018-01-14 DIAGNOSIS — R252 Cramp and spasm: Secondary | ICD-10-CM | POA: Insufficient documentation

## 2018-01-14 DIAGNOSIS — R269 Unspecified abnormalities of gait and mobility: Secondary | ICD-10-CM

## 2018-01-14 NOTE — Progress Notes (Signed)
Subjective:    Patient ID: Abigail Campos, female    DOB: 12-20-1970, 47 y.o.   MRN: [id number]  HPI:  47 year old right-handed female with history of hypertension, migraine headaches presents for follow up after right basal ganglia hemorrhage.   Last clinic visit 10/07/16.  Since last visit, pt states she completed therapies, but is resuming this month. She continues to follow up with Neurology. She has been wearing her AFO.  She had 3 falls last year from losing her balance.  BP is controlled.  She continues to take Baclofen, but states she is ready to try Botox now.   Pain Inventory Average Pain 5 Pain Right Now 0 My pain is intermittent and aching  In the last 24 hours, has pain interfered with the following? General activity 0 Relation with others 0 Enjoyment of life 0 What TIME of day is your pain at its worst? no pain Sleep (in general) Fair  Pain is worse with: some activites Pain improves with: medication Relief from Meds: 5  Mobility walk with assistance use a walker ability to climb steps?  yes do you drive?  no Do you have any goals in this area?  yes  Function not employed: date last employed April 19,2017 disabled: date disabled April 19,2017 I need assistance with the following:  dressing and household duties  Neuro/Psych bladder control problems trouble walking spasms dizziness confusion depression anxiety  Prior Studies Any changes since last visit?  no  Physicians involved in your care Any changes since last visit?  no   Family History  Problem Relation Age of Onset  . Diabetes Mother   . Hypertension Mother   . Glaucoma Mother   . Colon cancer Mother        mother age 15  . Stroke Paternal Grandfather   . Heart disease Unknown        2 uncles died March 31, 2011 from CAD-CHF  . Stroke Maternal Grandfather   . Diabetes Other        siblings  . Hypertension Other        siblings   Social History   Socioeconomic History  . Marital  status: Married    Spouse name: None  . Number of children: 2  . Years of education: None  . Highest education level: None  Social Needs  . Financial resource strain: None  . Food insecurity - worry: None  . Food insecurity - inability: None  . Transportation needs - medical: None  . Transportation needs - non-medical: None  Occupational History  . Occupation: TEACHER    Employer: [employer]  Tobacco Use  . Smoking status: Never Smoker  . Smokeless tobacco: Never Used  Substance and Sexual Activity  . Alcohol use: No  . Drug use: No  . Sexual activity: None  Other Topics Concern  . None  Social History Narrative   Used to leve independently, stroke 03-2016, d/c from rehab to his mother's house 04-2016.   1 child in college, another child w/ her    Past Surgical History:  Procedure Laterality Date  . ABDOMINAL HYSTERECTOMY  11-14-07   no oophorectomy per surgical report  . CESAREAN SECTION     [id number]  . INGUINAL HERNIA REPAIR     31-Mar-2003   Past Medical History:  Diagnosis Date  . Essential hypertension 10/19/2007   03-2010: metoprolol changed to bystolic (was not feeling well on it, no specific allergy or reaction)    . Hyperlipemia   .  Hypertension   . Stroke ([place])    BP 136/90 (BP Location: Right Arm, Patient Position: Sitting, Cuff Size: Normal)   Pulse (!) 58   Resp 14   SpO2 97%   Opioid Risk Score:   Fall Risk Score:  `1  Depression screen PHQ 2/9  Depression screen [facility] 2/9 08/13/2016 06/10/2016 05/15/2016 09/25/2015 07/17/2015  Decreased Interest 0 0 0 0 0  Down, Depressed, Hopeless 0 0 1 0 0  PHQ - 2 Score 0 0 1 0 0  Altered sleeping - - 1 - -  Tired, decreased energy - - 3 - -  Change in appetite - - 0 - -  Feeling bad or failure about yourself  - - 0 - -  Trouble concentrating - - 0 - -  Moving slowly or fidgety/restless - - 0 - -  Suicidal thoughts - - 0 - -  PHQ-9 Score - - 5 - -  Some recent data might be hidden    Review of Systems    Neurological: Positive for facial asymmetry, speech difficulty and weakness. Negative for numbness.  All other systems reviewed and are negative.     Objective:     Physical Exam Constitutional: She appears well-developed.  NAD HENT:  Normocephalic and atraumatic.   Eyes: EOM are normal.  No discharge. Cardiovascular: Normal rate and regular rhythm.   No JVD. Respiratory: Effort normal and breath sounds normal. No respiratory distress.   GI: Bowel sounds are normal. She exhibits no distension.  Musculoskeletal: She exhibits no edema or no tenderness.  Neurological: She is Alert and oriented 3.  Expressive difficulties with increased speed, improving Dysarthria  Left facial weakness Motor: RUE/RLE: 5/5 proximal to distal.   LLE: Hip flexion 4-/5, knee extension 3+/5, ADF/PF 1/5 Left upper extremity: Shoulder abduction 2/5, elbow flex 2/5, elbow ext 2-/5, hand grip 3/5.  Modified Ashworth: Left 1/4 elbow flexors, wrist flexors 1+/5, 3/4 left finger flexors, 2/4 ankle plantar flexors.  Skin: Skin is warm and dry.  Psychiatric: Normal mood and behavior    Assessment & Plan:  47 year old right-handed female with history of hypertension, migraine headaches presents for follow up after right basal ganglia hemorrhage.    1. 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 affecting left nondominant side  Hold off on therapies until after Botox injection  Cont meds  Cont Follow up Neurology  Will order WHO on next visit  Spastic hemiplegia affecting left non-dominant side  Had lengthy discussion with patient regarding treatment options for spasticity  Cont baclofen 10mg  TID (pt does not wish to increase due to side effects)  Will schedule for Botox injection:    Left Med biceps 50   Left FCR 25   Left FCU 25   Left FDS 50   Left FDP 25  2. HTN:   Cont meds  3. Gait abnormality  Cont hemiwalker

## 2018-01-28 ENCOUNTER — Encounter (HOSPITAL_BASED_OUTPATIENT_CLINIC_OR_DEPARTMENT_OTHER): Payer: 59 | Admitting: Physical Medicine & Rehabilitation

## 2018-01-28 ENCOUNTER — Other Ambulatory Visit: Payer: Self-pay

## 2018-01-28 ENCOUNTER — Encounter: Payer: Self-pay | Admitting: Physical Medicine & Rehabilitation

## 2018-01-28 VITALS — BP 137/92 | HR 47

## 2018-01-28 DIAGNOSIS — I69154 Hemiplegia and hemiparesis following nontraumatic intracerebral hemorrhage affecting left non-dominant side: Secondary | ICD-10-CM | POA: Diagnosis not present

## 2018-01-28 DIAGNOSIS — I69254 Hemiplegia and hemiparesis following other nontraumatic intracranial hemorrhage affecting left non-dominant side: Secondary | ICD-10-CM | POA: Diagnosis not present

## 2018-01-28 NOTE — Progress Notes (Signed)
Botox: Procedure Note Patient Name: TIAIRA ARAMBULA DOB: Apr 29, 1971 MRN: [id number] Date: 01/27/18  Procedure: Botulinum toxin administration Guidance: EMG Diagnosis: Spastic left nondominant IPH Attending: Delice Lesch, MD   Informed consent: Risks, benefits & options of the procedure are explained to the patient (and/or family). The patient elects to proceed with procedure. Risks include but are not limited to weakness, respiratory distress, dry mouth, ptosis, antibody formation, worsening of some areas of function. Benefits include decreased abnormal muscle tone, improved hygiene and positioning, decreased skin breakdown and, in some cases, decreased pain. Options include conservative management with oral antispasticity agents, phenol chemodenervation of nerve or at motor nerve branches. More invasive options include intrathecal balcofen adminstration for appropriate candidates. Surgical options may include tendon lengthening or transposition or, rarely, dorsal rhizotomy.   History/Physical Examination: 47 y.o.  Year old right-handed female with history of hypertension, migraine headaches presents for follow up after right basal ganglia hemorrhage.   Modified Ashworth: Left 1/4 elbow flexors, wrist flexors 1+/5, 2/4 left finger flexors, 2/4 ankle plantar flexors.  Previous Treatments: Therapy/Range of motion Indication for guidance: Target active muscules  Procedure: Botulinum toxin was mixed with preservative free saline with a dilution of 1cc to 100 units. Targeted limb and muscles were identified. The skin was prepped with alcohol swabs and placement of needle tip in targeted muscle was confirmed using appropriate guidance. Prior to injection, positioning of needle tip outside of blood vessel was determined by pulling back on syringe plunger.  MUSCLE UNITS  Left Med biceps 50 Left FCR 25                       Left FCU 25 Left FDS 50 Left FDP 25  Left Med gastroc: 25   Total  units used: 177  Complications: None  Plan: Follow up in 6 weeks  Ankit [name] 1:59 PM

## 2018-01-31 ENCOUNTER — Encounter (HOSPITAL_COMMUNITY): Payer: Self-pay | Admitting: Emergency Medicine

## 2018-01-31 ENCOUNTER — Ambulatory Visit (HOSPITAL_COMMUNITY)
Admission: EM | Admit: 2018-01-31 | Discharge: 2018-01-31 | Disposition: A | Discharge status: Home / Self Care | Payer: 59 | Attending: Family Medicine | Admitting: Family Medicine

## 2018-01-31 ENCOUNTER — Other Ambulatory Visit: Payer: Self-pay

## 2018-01-31 DIAGNOSIS — R51 Headache: Secondary | ICD-10-CM

## 2018-01-31 DIAGNOSIS — R519 Headache, unspecified: Secondary | ICD-10-CM

## 2018-01-31 MED ORDER — PREDNISONE 20 MG PO TABS: 40.0000 mg | ORAL_TABLET | Freq: Every day | ORAL | 0 refills | Status: AC

## 2018-01-31 NOTE — ED Provider Notes (Signed)
[place]    [name]: [id number] Arrival date & time: 01/31/18  1757     History   Chief Complaint Chief Complaint  Patient presents with  . Headache    HPI Abigail Campos is a 47 y.o. female.   HPI 5 d hx of occipital HA, aching in nature. She had a stroke recently and was concerned about that. She is not having any new balance issues, weakness, numbness, tingling, difficulty with speech or trouble swallowing. She is on Plavix and not taking any NSAIDs. She did receive botox injections in her extremities 1 d after onset of HA, no neck pain.   Past Medical History:  Diagnosis Date  . Essential hypertension 10/19/2007   03-2010: metoprolol changed to bystolic (was not feeling well on it, no specific allergy or reaction)    . Hyperlipemia   . Hypertension   . Stroke [facility])     Patient Active Problem List   Diagnosis Date Noted  . Spastic hemiparesis affecting dominant side ([place]) 01/04/2018  . Hyperglycemia 12/29/2016  . Depression 12/29/2016  . Muscle spasticity   . Obesity (BMI 30.0-34.9) 04/08/2016  . HLD (hyperlipidemia) 04/08/2016  . Basal ganglia hemorrhage ([place]) 04/08/2016  . Dysphagia as late effect of cerebrovascular disease   . Migraine with aura and without status migrainosus, not intractable   . Gait disturbance, post-stroke   . Hemiplegia, post-stroke ([place])   . Aphasia, post-stroke   . Dysarthria, post-stroke   . ICH (intracerebral hemorrhage) ([facility]) - R basal ganglia due to hypertensive emergency 04/01/2016  . Upper airway cough syndrome 12/06/2015  . PCP NOTES >>>>>>>>>>>>>>>>>>>>>>>>>>>.. 09/25/2015  . Insomnia 12/28/2012  . Annual physical exam 11/23/2011  . Hemorrhoids 10/13/2011  . Essential hypertension 10/19/2007    Past Surgical History:  Procedure Laterality Date  . ABDOMINAL HYSTERECTOMY  11-14-07   no oophorectomy per surgical report  . CESAREAN SECTION     [id number]  . INGUINAL HERNIA REPAIR     2004    OB History    Gravida Para Term Preterm AB Living   2 2           SAB TAB Ectopic Multiple Live Births                   Home Medications    Prior to Admission medications   Medication Sig Start Date End Date Taking? Authorizing Provider  atorvastatin (LIPITOR) 20 MG tablet Take 1 tablet (20 mg total) by mouth daily at 6 PM. 12/17/17   Colon Branch, MD  baclofen (LIORESAL) 10 MG tablet Take 1 tablet (10 mg total) by mouth 3 (three) times daily. 12/17/17   Colon Branch, MD  clopidogrel (PLAVIX) 75 MG tablet Take 1 tablet (75 mg total) by mouth daily. 12/17/17   Colon Branch, MD  escitalopram (LEXAPRO) 10 MG tablet Take 1.5 tablets (15 mg total) by mouth daily. 12/17/17   Colon Branch, MD  furosemide (LASIX) 20 MG tablet Take 1 tablet (20 mg total) by mouth daily. 12/17/17   Colon Branch, MD  lisinopril (PRINIVIL,ZESTRIL) 2.5 MG tablet Take 1 tablet (2.5 mg total) by mouth daily. 12/17/17   Colon Branch, MD  nebivolol (BYSTOLIC) 5 MG tablet Take 1 tablet (5 mg total) by mouth every 12 (twelve) hours. 12/17/17   Colon Branch, MD  predniSONE (DELTASONE) 20 MG tablet Take 2 tablets (40 mg total) by mouth daily with breakfast for 5 days. 01/31/18 02/05/18  Wendling,  Crosby Oyster, DO  traZODone (DESYREL) 50 MG tablet Take 1 tablet (50 mg total) by mouth at bedtime. 12/20/17   Colon Branch, MD    Family History Family History  Problem Relation Age of Onset  . Diabetes Mother   . Hypertension Mother   . Glaucoma Mother   . Colon cancer Mother        mother age 25  . Stroke Paternal Grandfather   . Heart disease Unknown        2 uncles died 2011/04/07 from CAD-CHF  . Stroke Maternal Grandfather   . Diabetes Other        siblings  . Hypertension Other        siblings    Social History Social History   Tobacco Use  . Smoking status: Never Smoker  . Smokeless tobacco: Never Used  Substance Use Topics  . Alcohol use: No  . Drug use: No     Allergies   Bee venom; Peanut-containing drug products; Hydrocodone; Isovue  [iopamidol]; Ambien [zolpidem tartrate]; Aspirin; and Latex   Review of Systems Review of Systems  Neurological: Positive for headaches.     Physical Exam Triage Vital Signs ED Triage Vitals  Enc Vitals Group     BP 01/31/18 1902 124/79     Pulse Rate 01/31/18 1902 (!) 54     Resp 01/31/18 1902 16     Temp 01/31/18 1902 98.4 F (36.9 C)     Temp Source 01/31/18 1902 Oral     SpO2 01/31/18 1902 100 %     Height 01/31/18 1903 4\' 11"  (1.499 m)   Updated Vital Signs BP 124/79 (BP Location: Right Arm)   Pulse (!) 54   Temp 98.4 F (36.9 C) (Oral)   Resp 16   Ht 4\' 11"  (1.499 m)   SpO2 100%   BMI 34.21 kg/m   Physical Exam  Constitutional: She is oriented to person, place, and time. She appears well-developed.  HENT:  Head: Normocephalic.  Pulmonary/Chest: Effort normal.  Musculoskeletal:  No TTP over paraspinal cervical msk or occipital triangle b/l, no jaw pain   Neurological: She is alert and oriented to person, place, and time. No cranial nerve deficit.  0/5 strength in LUE, 4/5 strength throughout LLE, 5/5 strength throughout RUE and RLE.  Skin: Skin is warm.  Psychiatric: She has a normal mood and affect. Her behavior is normal.     UC Treatments / Results  Procedures Procedures none  Initial Impression / Assessment and Plan / UC Course  I have reviewed the triage vital signs and the nursing notes.  Pertinent labs & imaging results that were available during my care of the patient were reviewed by me and considered in my medical decision making (see chart for details).     47 yo F w hx of recent stroke presents w occipital HA. No new s/s's suggestive of stroke or intracranial etiology. As she is on Plavix, will hold off on NSAIDs. Steroid burst to help with inflammation, Tylenol, home stretches/exercises for neck given, heat. F/u with PCP if symptoms fail to improve. She voiced understanding and agreement to the plan.    Final Clinical Impressions(s) / UC  Diagnoses   Final diagnoses:  Occipital headache    ED Discharge Orders        Ordered    predniSONE (DELTASONE) 20 MG tablet  Daily with breakfast     01/31/18 1916       Controlled Substance Prescriptions [place]  Controlled Substance Registry consulted? Not Applicable   Shelda Pal, [place] 01/31/18 2039

## 2018-01-31 NOTE — ED Triage Notes (Signed)
The patient presented to the [facility] with a complaint of a headache x 5 days. The patient reported a previous hx of stroke and migraines. The patient reported having botox injections in her arms 5 days ago as well.

## 2018-01-31 NOTE — Discharge Instructions (Signed)
Heat (pad or rice pillow in microwave) over affected neck, 10-15 minutes twice daily.   OK to take Tylenol 1000 mg (2 extra strength tabs) or 975 mg (3 regular strength tabs) every 6 hours as needed.  EXERCISES RANGE OF MOTION (ROM) AND STRETCHING EXERCISES  These exercises may help you when beginning to rehabilitate your issue. In order to successfully resolve your symptoms, you must improve your posture. These exercises are designed to help reduce the forward-head and rounded-shoulder posture which contributes to this condition. Your symptoms may resolve with or without further involvement from your physician, physical therapist or athletic trainer. While completing these exercises, remember:  Restoring tissue flexibility helps normal motion to return to the joints. This allows healthier, less painful movement and activity. An effective stretch should be held for at least 20 seconds, although you may need to begin with shorter hold times for comfort. A stretch should never be painful. You should only feel a gentle lengthening or release in the stretched tissue. Do not do any stretch or exercise that you cannot tolerate.  STRETCH- Axial Extensors Lie on your back on the floor. You may bend your knees for comfort. Place a rolled-up hand towel or dish towel, about 2 inches in diameter, under the part of your head that makes contact with the floor. Gently tuck your chin, as if trying to make a "double chin," until you feel a gentle stretch at the base of your head. Hold 15-20 seconds. Repeat 2-3 times. Complete this exercise 1 time per day.   STRETCH - Axial Extension  Stand or sit on a firm surface. Assume a good posture: chest up, shoulders drawn back, abdominal muscles slightly tense, knees unlocked (if standing) and feet hip width apart. Slowly retract your chin so your head slides back and your chin slightly lowers. Continue to look straight ahead. You should feel a gentle stretch in the back  of your head. Be certain not to feel an aggressive stretch since this can cause headaches later. Hold for 15-20 seconds. Repeat 2-3 times. Complete this exercise 1 time per day.  STRETCH - Cervical Side Bend  Stand or sit on a firm surface. Assume a good posture: chest up, shoulders drawn back, abdominal muscles slightly tense, knees unlocked (if standing) and feet hip width apart. Without letting your nose or shoulders move, slowly tip your right / left ear to your shoulder until your feel a gentle stretch in the muscles on the opposite side of your neck. Hold 15-20 seconds. Repeat 2-3 times. Complete this exercise 1-2 times per day.  STRETCH - Cervical Rotators  Stand or sit on a firm surface. Assume a good posture: chest up, shoulders drawn back, abdominal muscles slightly tense, knees unlocked (if standing) and feet hip width apart. Keeping your eyes level with the ground, slowly turn your head until you feel a gentle stretch along the back and opposite side of your neck. Hold 15-20 seconds. Repeat 2-3 times. Complete this exercise 1-2 times per day.  RANGE OF MOTION - Neck Circles  Stand or sit on a firm surface. Assume a good posture: chest up, shoulders drawn back, abdominal muscles slightly tense, knees unlocked (if standing) and feet hip width apart. Gently roll your head down and around from the back of one shoulder to the back of the other. The motion should never be forced or painful. Repeat the motion 10-20 times, or until you feel the neck muscles relax and loosen. Repeat 2-3 times. Complete the exercise  1-2 times per day. STRENGTHENING EXERCISES - Cervical Strain and Sprain These exercises may help you when beginning to rehabilitate your injury. They may resolve your symptoms with or without further involvement from your physician, physical therapist, or athletic trainer. While completing these exercises, remember:  Muscles can gain both the endurance and the strength needed for  everyday activities through controlled exercises. Complete these exercises as instructed by your physician, physical therapist, or athletic trainer. Progress the resistance and repetitions only as guided. You may experience muscle soreness or fatigue, but the pain or discomfort you are trying to eliminate should never worsen during these exercises. If this pain does worsen, stop and make certain you are following the directions exactly. If the pain is still present after adjustments, discontinue the exercise until you can discuss the trouble with your clinician.  STRENGTH - Cervical Flexors, Isometric Face a wall, standing about 6 inches away. Place a small pillow, a ball about 6-8 inches in diameter, or a folded towel between your forehead and the wall. Slightly tuck your chin and gently push your forehead into the soft object. Push only with mild to moderate intensity, building up tension gradually. Keep your jaw and forehead relaxed. Hold 10 to 20 seconds. Keep your breathing relaxed. Release the tension slowly. Relax your neck muscles completely before you start the next repetition. Repeat 2-3 times. Complete this exercise 1 time per day.  STRENGTH- Cervical Lateral Flexors, Isometric  Stand about 6 inches away from a wall. Place a small pillow, a ball about 6-8 inches in diameter, or a folded towel between the side of your head and the wall. Slightly tuck your chin and gently tilt your head into the soft object. Push only with mild to moderate intensity, building up tension gradually. Keep your jaw and forehead relaxed. Hold 10 to 20 seconds. Keep your breathing relaxed. Release the tension slowly. Relax your neck muscles completely before you start the next repetition. Repeat 2-3 times. Complete this exercise 1 time per day.  STRENGTH - Cervical Extensors, Isometric  Stand about 6 inches away from a wall. Place a small pillow, a ball about 6-8 inches in diameter, or a folded towel between  the back of your head and the wall. Slightly tuck your chin and gently tilt your head back into the soft object. Push only with mild to moderate intensity, building up tension gradually. Keep your jaw and forehead relaxed. Hold 10 to 20 seconds. Keep your breathing relaxed. Release the tension slowly. Relax your neck muscles completely before you start the next repetition. Repeat 2-3 times. Complete this exercise 1 time per day.  POSTURE AND BODY MECHANICS CONSIDERATIONS Keeping correct posture when sitting, standing or completing your activities will reduce the stress put on different body tissues, allowing injured tissues a chance to heal and limiting painful experiences. The following are general guidelines for improved posture. Your physician or physical therapist will provide you with any instructions specific to your needs. While reading these guidelines, remember: The exercises prescribed by your provider will help you have the flexibility and strength to maintain correct postures. The correct posture provides the optimal environment for your joints to work. All of your joints have less wear and tear when properly supported by a spine with good posture. This means you will experience a healthier, less painful body. Correct posture must be practiced with all of your activities, especially prolonged sitting and standing. Correct posture is as important when doing repetitive low-stress activities (typing) as it is  when doing a single heavy-load activity (lifting).  PROLONGED STANDING WHILE SLIGHTLY LEANING FORWARD When completing a task that requires you to lean forward while standing in one place for a long time, place either foot up on a stationary 2- to 4-inch high object to help maintain the best posture. When both feet are on the ground, the low back tends to lose its slight inward curve. If this curve flattens (or becomes too large), then the back and your other joints will experience too much  stress, fatigue more quickly, and can cause pain.   RESTING POSITIONS Consider which positions are most painful for you when choosing a resting position. If you have pain with flexion-based activities (sitting, bending, stooping, squatting), choose a position that allows you to rest in a less flexed posture. You would want to avoid curling into a fetal position on your side. If your pain worsens with extension-based activities (prolonged standing, working overhead), avoid resting in an extended position such as sleeping on your stomach. Most people will find more comfort when they rest with their spine in a more neutral position, neither too rounded nor too arched. Lying on a non-sagging bed on your side with a pillow between your knees, or on your back with a pillow under your knees will often provide some relief. Keep in mind, being in any one position for a prolonged period of time, no matter how correct your posture, can still lead to stiffness.  WALKING Walk with an upright posture. Your ears, shoulders, and hips should all line up. OFFICE WORK When working at a desk, create an environment that supports good, upright posture. Without extra support, muscles fatigue and lead to excessive strain on joints and other tissues.  CHAIR: A chair should be able to slide under your desk when your back makes contact with the back of the chair. This allows you to work closely. The chair's height should allow your eyes to be level with the upper part of your monitor and your hands to be slightly lower than your elbows. Body position: Your feet should make contact with the floor. If this is not possible, use a foot rest. Keep your ears over your shoulders. This will reduce stress on your neck and low back.

## 2018-02-04 ENCOUNTER — Ambulatory Visit: Payer: Medicaid Other

## 2018-02-04 ENCOUNTER — Encounter: Payer: Medicaid Other | Admitting: Occupational Therapy

## 2018-03-04 ENCOUNTER — Encounter (HOSPITAL_BASED_OUTPATIENT_CLINIC_OR_DEPARTMENT_OTHER): Payer: Self-pay

## 2018-03-04 ENCOUNTER — Other Ambulatory Visit: Payer: Self-pay

## 2018-03-04 ENCOUNTER — Emergency Department (HOSPITAL_BASED_OUTPATIENT_CLINIC_OR_DEPARTMENT_OTHER)
Admission: EM | Admit: 2018-03-04 | Discharge: 2018-03-04 | Disposition: A | Discharge status: Home / Self Care | Payer: 59 | Attending: Emergency Medicine | Admitting: Emergency Medicine

## 2018-03-04 DIAGNOSIS — Y9389 Activity, other specified: Secondary | ICD-10-CM | POA: Insufficient documentation

## 2018-03-04 DIAGNOSIS — R202 Paresthesia of skin: Secondary | ICD-10-CM | POA: Insufficient documentation

## 2018-03-04 DIAGNOSIS — X509XXA Other and unspecified overexertion or strenuous movements or postures, initial encounter: Secondary | ICD-10-CM | POA: Insufficient documentation

## 2018-03-04 DIAGNOSIS — Z79899 Other long term (current) drug therapy: Secondary | ICD-10-CM | POA: Diagnosis not present

## 2018-03-04 DIAGNOSIS — Z7902 Long term (current) use of antithrombotics/antiplatelets: Secondary | ICD-10-CM | POA: Diagnosis not present

## 2018-03-04 DIAGNOSIS — Y92009 Unspecified place in unspecified non-institutional (private) residence as the place of occurrence of the external cause: Secondary | ICD-10-CM | POA: Insufficient documentation

## 2018-03-04 DIAGNOSIS — I10 Essential (primary) hypertension: Secondary | ICD-10-CM | POA: Insufficient documentation

## 2018-03-04 DIAGNOSIS — Z91040 Latex allergy status: Secondary | ICD-10-CM | POA: Insufficient documentation

## 2018-03-04 DIAGNOSIS — Z91010 Allergy to peanuts: Secondary | ICD-10-CM | POA: Diagnosis not present

## 2018-03-04 DIAGNOSIS — M79641 Pain in right hand: Secondary | ICD-10-CM | POA: Insufficient documentation

## 2018-03-04 DIAGNOSIS — Y999 Unspecified external cause status: Secondary | ICD-10-CM | POA: Insufficient documentation

## 2018-03-04 MED ORDER — IBUPROFEN 800 MG PO TABS
800.0000 mg | ORAL_TABLET | Freq: Once | ORAL | Status: AC
  Administered 2018-03-04: 800 mg via ORAL
  Filled 2018-03-04: qty 1

## 2018-03-04 MED ORDER — ACETAMINOPHEN 500 MG PO TABS
1000.0000 mg | ORAL_TABLET | Freq: Once | ORAL | Status: AC
Start: 2018-03-04 — End: 2018-03-04
  Administered 2018-03-04: 1000 mg via ORAL
  Filled 2018-03-04: qty 2

## 2018-03-04 NOTE — ED Triage Notes (Signed)
Pt presents with right thumb/wrist pain and numbness. Pt has hx of stroke with weakness to left side.

## 2018-03-04 NOTE — Discharge Instructions (Signed)
Take 4 over the counter ibuprofen tablets 3 times a day or 2 over-the-counter naproxen tablets twice a day for pain. Also take tylenol 1000mg(2 extra strength) four times a day.    

## 2018-03-04 NOTE — ED Provider Notes (Signed)
[place] EMERGENCY DEPARTMENT Provider Note   [name]: [id number] Arrival date & time: 03/04/18  1901     History   Chief Complaint Chief Complaint  Patient presents with  . Numbness    HPI Abigail Campos is a 47 y.o. female.  47 yo F with a chief complaint of right hand pain.  Going on for the past few days.  Patient denies any injury denies fevers or chills.  She describes it as a sharp and stabbing pain.  Feels that it hurts so much that the area feels tingly as well.  She denies any other areas of pain.  She uses her right hand mostly for mobility as she has had a significant left-sided weakness due to a stroke.  The history is provided by the patient.  Illness  This is a new problem. The current episode started more than 2 days ago. The problem occurs constantly. The problem has been gradually worsening. Pertinent negatives include no chest pain, no headaches and no shortness of breath. The symptoms are aggravated by bending and twisting. Nothing relieves the symptoms. She has tried nothing for the symptoms. The treatment provided no relief.    Past Medical History:  Diagnosis Date  . Essential hypertension 10/19/2007   03-2010: metoprolol changed to bystolic (was not feeling well on it, no specific allergy or reaction)    . Hyperlipemia   . Hypertension   . Stroke [facility])     Patient Active Problem List   Diagnosis Date Noted  . Spastic hemiparesis affecting dominant side ([place]) 01/04/2018  . Hyperglycemia 12/29/2016  . Depression 12/29/2016  . Muscle spasticity   . Obesity (BMI 30.0-34.9) 04/08/2016  . HLD (hyperlipidemia) 04/08/2016  . Basal ganglia hemorrhage ([place]) 04/08/2016  . Dysphagia as late effect of cerebrovascular disease   . Migraine with aura and without status migrainosus, not intractable   . Gait disturbance, post-stroke   . Hemiplegia, post-stroke ([place])   . Aphasia, post-stroke   . Dysarthria, post-stroke   . ICH (intracerebral  hemorrhage) (HCC) - R basal ganglia due to hypertensive emergency 04/01/2016  . Upper airway cough syndrome 12/06/2015  . PCP NOTES >>>>>>>>>>>>>>>>>>>>>>>>>>>.. 09/25/2015  . Insomnia 12/28/2012  . Annual physical exam 11/23/2011  . Hemorrhoids 10/13/2011  . Essential hypertension 10/19/2007    Past Surgical History:  Procedure Laterality Date  . ABDOMINAL HYSTERECTOMY  11-14-07   no oophorectomy per surgical report  . CESAREAN SECTION     [id number]  . INGUINAL HERNIA REPAIR     2004     OB History    Gravida  2   Para  2   Term      Preterm      AB      Living        SAB      TAB      Ectopic      Multiple      Live Births               Home Medications    Prior to Admission medications   Medication Sig Start Date End Date Taking? Authorizing Provider  atorvastatin (LIPITOR) 20 MG tablet Take 1 tablet (20 mg total) by mouth daily at 6 PM. 12/17/17   Colon Branch, MD  baclofen (LIORESAL) 10 MG tablet Take 1 tablet (10 mg total) by mouth 3 (three) times daily. 12/17/17   Colon Branch, MD  clopidogrel (PLAVIX) 75 MG tablet Take 1 tablet (75  mg total) by mouth daily. 12/17/17   Colon Branch, MD  escitalopram (LEXAPRO) 10 MG tablet Take 1.5 tablets (15 mg total) by mouth daily. 12/17/17   Colon Branch, MD  furosemide (LASIX) 20 MG tablet Take 1 tablet (20 mg total) by mouth daily. 12/17/17   Colon Branch, MD  lisinopril (PRINIVIL,ZESTRIL) 2.5 MG tablet Take 1 tablet (2.5 mg total) by mouth daily. 12/17/17   Colon Branch, MD  nebivolol (BYSTOLIC) 5 MG tablet Take 1 tablet (5 mg total) by mouth every 12 (twelve) hours. 12/17/17   Colon Branch, MD  traZODone (DESYREL) 50 MG tablet Take 1 tablet (50 mg total) by mouth at bedtime. 12/20/17   Colon Branch, MD    Family History Family History  Problem Relation Age of Onset  . Diabetes Mother   . Hypertension Mother   . Glaucoma Mother   . Colon cancer Mother        mother age 28  . Stroke Paternal Grandfather   . Heart disease  Unknown        2 uncles died 21-Mar-2011 from CAD-CHF  . Stroke Maternal Grandfather   . Diabetes Other        siblings  . Hypertension Other        siblings    Social History Social History   Tobacco Use  . Smoking status: Never Smoker  . Smokeless tobacco: Never Used  Substance Use Topics  . Alcohol use: No  . Drug use: No     Allergies   Bee venom; Peanut-containing drug products; Hydrocodone; Isovue [iopamidol]; Ambien [zolpidem tartrate]; Aspirin; and Latex   Review of Systems Review of Systems  Constitutional: Negative for chills and fever.  HENT: Negative for congestion and rhinorrhea.   Eyes: Negative for redness and visual disturbance.  Respiratory: Negative for shortness of breath and wheezing.   Cardiovascular: Negative for chest pain and palpitations.  Gastrointestinal: Negative for nausea and vomiting.  Genitourinary: Negative for dysuria and urgency.  Musculoskeletal: Positive for arthralgias and myalgias.  Skin: Negative for pallor and wound.  Neurological: Negative for dizziness and headaches.     Physical Exam Updated Vital Signs BP (!) 133/94 (BP Location: Right Arm)   Pulse 83   Temp 98.3 F (36.8 C) (Oral)   Resp 18   Ht 4\' 11"  (1.499 m)   Wt 76.7 kg (169 lb)   SpO2 98%   BMI 34.13 kg/m   Physical Exam  Constitutional: She is oriented to person, place, and time. She appears well-developed and well-nourished. No distress.  HENT:  Head: Normocephalic and atraumatic.  Eyes: Pupils are equal, round, and reactive to light. EOM are normal.  Neck: Normal range of motion. Neck supple.  Cardiovascular: Normal rate and regular rhythm. Exam reveals no gallop and no friction rub.  No murmur heard. Pulmonary/Chest: Effort normal. She has no wheezes. She has no rales.  Abdominal: Soft. She exhibits no distension and no mass. There is no tenderness. There is no guarding.  Musculoskeletal: She exhibits tenderness. She exhibits no edema.  Focally tender to  the thenar eminence of the right hand.  Intact sensation to light touch in all nerve distributions.  Intact motor in all nerve distributions.  Radial pulse 2+.  Negative Finkelstein's test  Neurological: She is alert and oriented to person, place, and time.  Skin: Skin is warm and dry. She is not diaphoretic.  Psychiatric: She has a normal mood and affect. Her behavior is normal.  Nursing note and vitals reviewed.    ED Treatments / Results  Labs (all labs ordered are listed, but only abnormal results are displayed) Labs Reviewed - No data to display  EKG EKG Interpretation  Date/Time:  Friday March 04 2018 19:22:43 EDT Ventricular Rate:  71 PR Interval:    QRS Duration: 85 QT Interval:  418 QTC Calculation: 455 R Axis:   4 Text Interpretation:  Sinus rhythm Consider anterior infarct Baseline wander in lead(s) II III aVL aVF V1 No significant change since last tracing Confirmed by Deno Etienne [phone]) on 03/04/2018 7:27:21 PM   Radiology No results found.  Procedures Procedures (including critical care time)  Medications Ordered in ED Medications  acetaminophen (TYLENOL) tablet 1,000 mg (has no administration in time range)  ibuprofen (ADVIL,MOTRIN) tablet 800 mg (has no administration in time range)     Initial Impression / Assessment and Plan / ED Course  I have reviewed the triage vital signs and the nursing notes.  Pertinent labs & imaging results that were available during my care of the patient were reviewed by me and considered in my medical decision making (see chart for details).     47 yo F with a chief complaint of right hand pain.  The patient has significant pain over the thenar eminence of the right hand.  Upon further history the patient did admit to putting a lot of force on to that area of her hand to try and open her medicine bottles when her husband was out of town.  I will place the patient in a splint.  Have her take Tylenol and ibuprofen.  PCP  follow-up.  7:43 PM:  I have discussed the diagnosis/risks/treatment options with the patient and family and believe the pt to be eligible for discharge home to follow-up with PCP. We also discussed returning to the ED immediately if new or worsening sx occur. We discussed the sx which are most concerning (e.g., sudden worsening pain, fever, inability to tolerate by mouth) that necessitate immediate return. Medications administered to the patient during their visit and any new prescriptions provided to the patient are listed below.  Medications given during this visit Medications  acetaminophen (TYLENOL) tablet 1,000 mg (has no administration in time range)  ibuprofen (ADVIL,MOTRIN) tablet 800 mg (has no administration in time range)     The patient appears reasonably screen and/or stabilized for discharge and I doubt any other medical condition or other [facility] requiring further screening, evaluation, or treatment in the ED at this time prior to discharge.    Final Clinical Impressions(s) / ED Diagnoses   Final diagnoses:  Hand pain, right    ED Discharge Orders    None       Deno Etienne, DO 03/04/18 1943

## 2018-03-11 ENCOUNTER — Encounter: Payer: Self-pay | Admitting: Physical Medicine & Rehabilitation

## 2018-03-11 ENCOUNTER — Encounter: Payer: 59 | Attending: Physical Medicine & Rehabilitation | Admitting: Physical Medicine & Rehabilitation

## 2018-03-11 VITALS — BP 118/80 | HR 64 | Resp 14 | Ht 59.0 in | Wt 172.0 lb

## 2018-03-11 DIAGNOSIS — R58 Hemorrhage, not elsewhere classified: Secondary | ICD-10-CM | POA: Diagnosis not present

## 2018-03-11 DIAGNOSIS — R269 Unspecified abnormalities of gait and mobility: Secondary | ICD-10-CM

## 2018-03-11 DIAGNOSIS — E785 Hyperlipidemia, unspecified: Secondary | ICD-10-CM | POA: Insufficient documentation

## 2018-03-11 DIAGNOSIS — M65311 Trigger thumb, right thumb: Secondary | ICD-10-CM

## 2018-03-11 DIAGNOSIS — I10 Essential (primary) hypertension: Secondary | ICD-10-CM | POA: Diagnosis present

## 2018-03-11 DIAGNOSIS — I69254 Hemiplegia and hemiparesis following other nontraumatic intracranial hemorrhage affecting left non-dominant side: Secondary | ICD-10-CM | POA: Insufficient documentation

## 2018-03-11 DIAGNOSIS — I69398 Other sequelae of cerebral infarction: Secondary | ICD-10-CM | POA: Diagnosis not present

## 2018-03-11 DIAGNOSIS — I69154 Hemiplegia and hemiparesis following nontraumatic intracerebral hemorrhage affecting left non-dominant side: Secondary | ICD-10-CM

## 2018-03-11 DIAGNOSIS — R252 Cramp and spasm: Secondary | ICD-10-CM | POA: Diagnosis not present

## 2018-03-11 DIAGNOSIS — G43909 Migraine, unspecified, not intractable, without status migrainosus: Secondary | ICD-10-CM | POA: Insufficient documentation

## 2018-03-11 NOTE — Progress Notes (Addendum)
Subjective:    Patient ID: Abigail Campos, female    DOB: 1971/07/10, 47 y.o.   MRN: [id number]  HPI:  47 year old right-handed female with history of hypertension, migraine headaches presents for follow up after right basal ganglia hemorrhage.   Last clinic visit 01/28/18.  At that time, pt had Botox injection.  Since that time, pt went to the ED twice for headache and for right hand pain.  Pt states she did well with the Botox injection. She notes trigger thumb on right with pain.    Pain Inventory Average Pain 4 Pain Right Now 6 My pain is intermittent and dull  In the last 24 hours, has pain interfered with the following? General activity 0 Relation with others 0 Enjoyment of life 0 What TIME of day is your pain at its worst? night Sleep (in general) Poor  Pain is worse with: some activites Pain improves with: medication Relief from Meds: 0  Mobility walk with assistance use a walker how many minutes can you walk? 40 ability to climb steps?  yes do you drive?  no Do you have any goals in this area?  no  Function disabled: date disabled .  Neuro/Psych bladder control problems trouble walking spasms  Prior Studies Any changes since last visit?  no  Physicians involved in your care Any changes since last visit?  no   Family History  Problem Relation Age of Onset  . Diabetes Mother   . Hypertension Mother   . Glaucoma Mother   . Colon cancer Mother        mother age 74  . Stroke Paternal Grandfather   . Heart disease Unknown        2 uncles died March 21, 2011 from CAD-CHF  . Stroke Maternal Grandfather   . Diabetes Other        siblings  . Hypertension Other        siblings   Social History   Socioeconomic History  . Marital status: Married    Spouse name: Not on file  . Number of children: 2  . Years of education: Not on file  . Highest education level: Not on file  Occupational History  . Occupation: Product manager: [employer]  . Financial resource strain: Not on file  . Food insecurity:    Worry: Not on file    Inability: Not on file  . Transportation needs:    Medical: Not on file    Non-medical: Not on file  Tobacco Use  . Smoking status: Never Smoker  . Smokeless tobacco: Never Used  Substance and Sexual Activity  . Alcohol use: No  . Drug use: No  . Sexual activity: Not on file  Lifestyle  . Physical activity:    Days per week: Not on file    Minutes per session: Not on file  . Stress: Not on file  Relationships  . Social connections:    Talks on phone: Not on file    Gets together: Not on file    Attends religious service: Not on file    Active member of club or organization: Not on file    Attends meetings of clubs or organizations: Not on file    Relationship status: Not on file  Other Topics Concern  . Not on file  Social History Narrative   Used to leve independently, stroke 03-2016, d/c from rehab to his mother's house 04-2016.   1 child in college,  another child w/ her    Past Surgical History:  Procedure Laterality Date  . ABDOMINAL HYSTERECTOMY  11-14-07   no oophorectomy per surgical report  . CESAREAN SECTION     [id number]  . INGUINAL HERNIA REPAIR     2004   Past Medical History:  Diagnosis Date  . Essential hypertension 10/19/2007   03-2010: metoprolol changed to bystolic (was not feeling well on it, no specific allergy or reaction)    . Hyperlipemia   . Hypertension   . Stroke ([place])    BP 118/80 (BP Location: Right Arm, Patient Position: Sitting, Cuff Size: Normal)   Pulse 64   Resp 14   SpO2 96%   Opioid Risk Score:   Fall Risk Score:  `1  Depression screen PHQ 2/9  Depression screen [facility] 2/9 01/28/2018 08/13/2016 06/10/2016 05/15/2016 09/25/2015 07/17/2015  Decreased Interest 0 0 0 0 0 0  Down, Depressed, Hopeless 0 0 0 1 0 0  PHQ - 2 Score 0 0 0 1 0 0  Altered sleeping - - - 1 - -  Tired, decreased energy - - - 3 - -  Change in appetite - - - 0 - -  Feeling  bad or failure about yourself  - - - 0 - -  Trouble concentrating - - - 0 - -  Moving slowly or fidgety/restless - - - 0 - -  Suicidal thoughts - - - 0 - -  PHQ-9 Score - - - 5 - -  Some recent data might be hidden    Review of Systems  Musk: Gait abnormality Neurological: Positive for facial asymmetry, speech difficulty and weakness. Negative for numbness.  All other systems reviewed and are negative.     Objective:     Physical Exam Constitutional: She appears well-developed.  NAD HENT:  Normocephalic and atraumatic.   Eyes: EOM are normal.  No discharge. Cardiovascular: RRR.   No JVD. Respiratory: Effort normal and breath sounds normal. No respiratory distress.   GI: Bowel sounds are normal. She exhibits no distension.  Musculoskeletal:  Trigger thumb on right Neurological: She is Alert and oriented 3.  Expressive difficulties with increased speed, improving Dysarthria  Left facial weakness Motor: RUE/RLE: 5/5 proximal to distal.   LLE: Hip flexion 4-/5, knee extension 3+/5, ADF/PF 1/5 Left upper extremity: Shoulder abduction 2/5, elbow flex 2/5, elbow ext 2-/5, hand grip 3/5.  Modified [place]: Left 1/4 elbow flexors, wrist flexors 1+/5, 2/4 left finger flexors, 2/4 ankle plantar flexors.  Skin: Skin is warm and dry.  Psychiatric: Normal mood and behavior    Assessment & Plan:  47 year old right-handed female with history of hypertension, migraine headaches presents for follow up after right basal ganglia hemorrhage.    1. 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 affecting left nondominant side  Cont meds  Cont Follow up Neurology  Spastic hemiplegia affecting left non-dominant side  Have had lengthy discussion with patient regarding treatment options for spasticity  Cont baclofen 10mg  TID (pt does not wish to increase due to side effects)  Will schedule for Botox injection:    Left Med biceps, increase to 100   Left FCR will increase to  50   Left FCU, will increase to 50   Left FDS, will increase to 100   Left FDP, will increase to 100  Will order PT/OT  Ordered WHO to reduce pain, stabilize joint and limit chances for contractures.  2. Gait abnormality  Cont hemiwalker  Therapies  ordered  3. Right trigger thumb  Will schedule for injection

## 2018-03-16 ENCOUNTER — Encounter: Payer: Self-pay | Admitting: Physical Medicine & Rehabilitation

## 2018-03-16 ENCOUNTER — Encounter: Payer: 59 | Attending: Physical Medicine & Rehabilitation | Admitting: Physical Medicine & Rehabilitation

## 2018-03-16 VITALS — BP 152/92 | HR 58 | Resp 14 | Ht 59.0 in | Wt 173.0 lb

## 2018-03-16 DIAGNOSIS — R58 Hemorrhage, not elsewhere classified: Secondary | ICD-10-CM | POA: Insufficient documentation

## 2018-03-16 DIAGNOSIS — G43909 Migraine, unspecified, not intractable, without status migrainosus: Secondary | ICD-10-CM | POA: Diagnosis not present

## 2018-03-16 DIAGNOSIS — I10 Essential (primary) hypertension: Secondary | ICD-10-CM | POA: Insufficient documentation

## 2018-03-16 DIAGNOSIS — R252 Cramp and spasm: Secondary | ICD-10-CM | POA: Diagnosis not present

## 2018-03-16 DIAGNOSIS — M6530 Trigger finger, unspecified finger: Secondary | ICD-10-CM | POA: Diagnosis not present

## 2018-03-16 DIAGNOSIS — E785 Hyperlipidemia, unspecified: Secondary | ICD-10-CM | POA: Diagnosis not present

## 2018-03-16 DIAGNOSIS — I69254 Hemiplegia and hemiparesis following other nontraumatic intracranial hemorrhage affecting left non-dominant side: Secondary | ICD-10-CM | POA: Diagnosis present

## 2018-03-16 NOTE — Progress Notes (Signed)
Stenosing tenosynovitis injection  Indication: Trigger finger pain not relieved by conservative care.  Informed consent was obtained after describing risks and benefits of the procedure with the patient, this includes bleeding, bruising, infection and medication side effects. The patient wishes to proceed and has given written consent. Patient was placed in a seated position with volar hand facing up. The right first digit A1 pulley was marked and prepped with betadine. Vapocoolant spray was applied. A 27-gauge 1-1/2 inch needle was inserted, directed into the tendon and then slightly withdrawn.  After negative draw back for blood, a solution containing 0.25 mL of 6 mg per ML celestone and 0.25 mL of 2% lidocaine was injected. A band aid was applied. The patient tolerated the procedure well. Post procedure instructions were given.

## 2018-03-18 ENCOUNTER — Other Ambulatory Visit: Payer: Self-pay | Admitting: Internal Medicine

## 2018-03-18 DIAGNOSIS — I610 Nontraumatic intracerebral hemorrhage in hemisphere, subcortical: Secondary | ICD-10-CM

## 2018-04-21 ENCOUNTER — Ambulatory Visit (INDEPENDENT_AMBULATORY_CARE_PROVIDER_SITE_OTHER): Payer: 59 | Admitting: Internal Medicine

## 2018-04-21 ENCOUNTER — Encounter: Payer: Self-pay | Admitting: Internal Medicine

## 2018-04-21 VITALS — BP 136/84 | HR 64 | Temp 98.1°F | Resp 14 | Ht 59.0 in | Wt 171.0 lb

## 2018-04-21 DIAGNOSIS — Z0000 Encounter for general adult medical examination without abnormal findings: Secondary | ICD-10-CM

## 2018-04-21 DIAGNOSIS — R739 Hyperglycemia, unspecified: Secondary | ICD-10-CM

## 2018-04-21 DIAGNOSIS — Z23 Encounter for immunization: Secondary | ICD-10-CM

## 2018-04-21 DIAGNOSIS — I610 Nontraumatic intracerebral hemorrhage in hemisphere, subcortical: Secondary | ICD-10-CM

## 2018-04-21 DIAGNOSIS — F32A Depression, unspecified: Secondary | ICD-10-CM

## 2018-04-21 DIAGNOSIS — F329 Major depressive disorder, single episode, unspecified: Secondary | ICD-10-CM | POA: Diagnosis not present

## 2018-04-21 DIAGNOSIS — N6489 Other specified disorders of breast: Secondary | ICD-10-CM

## 2018-04-21 LAB — COMPREHENSIVE METABOLIC PANEL
ALT: 18 U/L (ref 0–35)
AST: 16 U/L (ref 0–37)
Albumin: 3.8 g/dL (ref 3.5–5.2)
Alkaline Phosphatase: 75 U/L (ref 39–117)
BUN: 8 mg/dL (ref 6–23)
CO2: 30 mEq/L (ref 19–32)
Calcium: 9.2 mg/dL (ref 8.4–10.5)
Chloride: 102 mEq/L (ref 96–112)
Creatinine, Ser: 0.78 mg/dL (ref 0.40–1.20)
GFR: 102.96 mL/min (ref >60.00)
Glucose, Bld: 115 mg/dL — ABNORMAL HIGH (ref 70–99)
Potassium: 3.8 mEq/L (ref 3.5–5.1)
Sodium: 137 mEq/L (ref 135–145)
Total Bilirubin: 0.6 mg/dL (ref 0.2–1.2)
Total Protein: 7.3 g/dL (ref 6.0–8.3)

## 2018-04-21 LAB — LIPID PANEL
Cholesterol: 120 mg/dL (ref 0–200)
HDL: 46 mg/dL (ref >39.00)
LDL Cholesterol: 60 mg/dL (ref 0–99)
NonHDL: 73.67
Total CHOL/HDL Ratio: 3
Triglycerides: 66 mg/dL (ref 0.0–149.0)
VLDL: 13.2 mg/dL (ref 0.0–40.0)

## 2018-04-21 LAB — HEMOGLOBIN A1C: Hgb A1c MFr Bld: 6.3 % (ref 4.6–6.5)

## 2018-04-21 NOTE — Patient Instructions (Addendum)
GO TO THE LAB : Get the blood work     GO TO THE FRONT DESK Schedule your next appointment for a  Check up in 3 months  

## 2018-04-21 NOTE — Progress Notes (Signed)
Pre visit review using our clinic review tool, if applicable. No additional management support is needed unless otherwise documented below in the visit note. 

## 2018-04-21 NOTE — Assessment & Plan Note (Addendum)
-  Tdap 2015; prevnar 12/2016; PNM 24 today - female care: To see Dr. Roland Rack in December, had a Pap 2017 asymmetric breast exam: Rx diagnostic mammogram and possibly ultrasound -CCS: no previous cscope , mother dx w/  Colon ca age 47, consider gi referral when pt is 37 -Labs: CMP, FLP, A1c -Discussed diet.  Continue daily walks.

## 2018-04-21 NOTE — Progress Notes (Signed)
Subjective:    Patient ID: Abigail Campos, female    DOB: [birth year], 47 y.o.   MRN: [id number]  DOS:  04/21/2018 Type of visit - description : cpx Interval history: She has few concerns Concerned about her weight gain, heat increased since she stopped doing physical therapy although she remains active and trying to walk daily. Depression is not as well-controlled as she would like to.  She is a still very frustrated about her situation related to the stroke. Occasional cough at night, symptoms resolve if she keeps her head elevated, that started after the stroke.  No choking. Concerned about her left breast, seems bigger than the right since she had a stroke, on self palpation there is no dominant lumps or tenderness.  Wt Readings from Last 3 Encounters:  04/21/18 171 lb (77.6 kg)  03/16/18 173 lb (78.5 kg)  03/11/18 172 lb (78 kg)     Review of Systems No suicidal ideas  Other than above, a 14 point review of systems is negative    Past Medical History:  Diagnosis Date  . Essential hypertension 10/19/2007   03-2010: metoprolol changed to bystolic (was not feeling well on it, no specific allergy or reaction)    . Hyperlipemia   . Hypertension   . Stroke [facility])     Past Surgical History:  Procedure Laterality Date  . ABDOMINAL HYSTERECTOMY  11-14-07   no oophorectomy per surgical report  . CESAREAN SECTION     [id number]  . INGUINAL HERNIA REPAIR     2004    Social History   Socioeconomic History  . Marital status: Married    Spouse name: Not on file  . Number of children: 2  . Years of education: Not on file  . Highest education level: Not on file  Occupational History  . Occupation: disable d/t Architectural technologist: [employer]  . Financial resource strain: Not on file  . Food insecurity:    Worry: Not on file    Inability: Not on file  . Transportation needs:    Medical: Not on file    Non-medical: Not on file  Tobacco Use  .  Smoking status: Never Smoker  . Smokeless tobacco: Never Used  Substance and Sexual Activity  . Alcohol use: No  . Drug use: No  . Sexual activity: Not on file  Lifestyle  . Physical activity:    Days per week: Not on file    Minutes per session: Not on file  . Stress: Not on file  Relationships  . Social connections:    Talks on phone: Not on file    Gets together: Not on file    Attends religious service: Not on file    Active member of club or organization: Not on file    Attends meetings of clubs or organizations: Not on file    Relationship status: Not on file  . Intimate partner violence:    Fear of current or ex partner: Not on file    Emotionally abused: Not on file    Physically abused: Not on file    Forced sexual activity: Not on file  Other Topics Concern  . Not on file  Social History Narrative   Used to live independently, stroke 03-2016, d/c from rehab to his mother's house 04-2016, then moved back w/ her husband as off 04/2018   Daughter finished [employer], one son, both @ home  Family History  Problem Relation Age of Onset  . Diabetes Mother   . Hypertension Mother   . Glaucoma Mother   . Colon cancer Mother        mother age 52  . Stroke Paternal Grandfather   . Heart disease Unknown        2 uncles died Mar 20, 2011 from CAD-CHF  . Stroke Maternal Grandfather   . Diabetes Other        siblings  . Hypertension Other        siblings  . Breast cancer Neg Hx      Allergies as of 04/21/2018      Reactions   Bee Venom Anaphylaxis   Peanut-containing Drug Products Anaphylaxis   Hydrocodone Hives   Generalize itching w/o rash   Isovue [iopamidol] Hives, Itching   Pt broke out with one facial hive.  Had itching on her right upper ant and posterior arm w/o evidence of hives.  Pt given water and 25mg  benadryl po.  We observed pt for 30 minutes before d/c.  J. Bohm     Ambien [zolpidem Tartrate] Other (See Comments)   forgetfulness   Aspirin Swelling     Reports blisters w/ ASA but pt reports is ok w/ Motrin, Advil, naproxen   Latex Hives      Medication List        Accurate as of 04/21/18 11:59 PM. Always use your most recent med list.          atorvastatin 20 MG tablet Commonly known as:  LIPITOR Take 1 tablet (20 mg total) by mouth daily at 6 PM.   baclofen 10 MG tablet Commonly known as:  LIORESAL Take 1 tablet (10 mg total) by mouth 3 (three) times daily.   clopidogrel 75 MG tablet Commonly known as:  PLAVIX Take 1 tablet (75 mg total) by mouth daily.   escitalopram 10 MG tablet Commonly known as:  LEXAPRO Take 1.5 tablets (15 mg total) by mouth daily.   furosemide 20 MG tablet Commonly known as:  LASIX Take 1 tablet (20 mg total) by mouth daily.   lisinopril 2.5 MG tablet Commonly known as:  PRINIVIL,ZESTRIL Take 1 tablet (2.5 mg total) by mouth daily.   nebivolol 5 MG tablet Commonly known as:  BYSTOLIC Take 1 tablet (5 mg total) by mouth every 12 (twelve) hours.   traZODone 50 MG tablet Commonly known as:  DESYREL Take 1 tablet (50 mg total) by mouth at bedtime.          Objective:   Physical Exam BP 136/84 (BP Location: Right Arm, Patient Position: Sitting, Cuff Size: Small)   Pulse 64   Temp 98.1 F (36.7 C) (Oral)   Resp 14   Ht 4\' 11"  (1.499 m)   Wt 171 lb (77.6 kg)   SpO2 98%   BMI 34.54 kg/m  General:   Well developed, well nourished . NAD.  Neck: No  thyromegaly  HEENT:  Normocephalic . Face symmetric, atraumatic Breast exam: Right breast: Normal to inspection, palpation, no axillary lymphadenopathies Left breast: Normal to inspection and palpation, left breast is indeed larger than the right, simply seems to have more breast tissue, no dominant lumps, nipple normal, no peau d'orange , no lymphadenopathy. Lungs:  CTA B Normal respiratory effort, no intercostal retractions, no accessory muscle use. Heart: RRR,  no murmur.  No pretibial edema bilaterally  Abdomen:  Not distended,  soft, non-tender. No rebound or rigidity.   Skin: Exposed areas without  rash. Not pale. Not jaundice Neurologic:  alert & oriented X3.  Speech normal, gait and motor exam at baseline, history of a stroke with left paresis. Psych: Cognition and judgment appear intact.  Cooperative with normal attention span and concentration.  Behavior appropriate. Somewhat frustrated and mildly depressed appearing.  Not anxious.    Assessment & Plan:   Assessment   Prediabetes: A1c 6.0 (April 2017)  HTN Hyperlipidemia Depression, insomnia GERD Stroke 03-2016:  ICH, right basilar ganglia due to HTN emergency, + encephalopathy, + dysphagia, aphasia, dysarthria Residual L spasticity.  CTA neck 03-2016 neg CTA head 10-17 (-) aneurysm Headaches , migraines dx after 2nd pregnancy, (-) CT head 2014 and 2015 Insomnia   Cough, persisting, saw Dr. Melvyn Novas 11-2015  PLAN: Prediabetes.  Has gained weight lately, check a A1c HTN: Controlled, continue Lasix, lisinopril, Bystolic Hyperlipidemia: Continue Lipitor, checking labs Depression insomnia: Not as well-controlled as she would like, on Lexapro 15, we talk about adjusting her medication (add Wellbutrin?) and/or formal counseling; elected counseling, information provided, come back in 3 months for possibly changes in her medication. Cough: As described above, she does have a history of chronic cough for years.  Recommend to prevent cough by position herself right when she goes to bed. RTC 3 months   Today, I spent more than  15  min with the patient: >50% of the time counseling regards depression, role of counseling and may be adding some other medications.  We also manage her chronic medical problems.

## 2018-04-22 NOTE — Assessment & Plan Note (Signed)
Prediabetes.  Has gained weight lately, check a A1c HTN: Controlled, continue Lasix, lisinopril, Bystolic Hyperlipidemia: Continue Lipitor, checking labs Depression insomnia: Not as well-controlled as she would like, on Lexapro 15, we talk about adjusting her medication (add Wellbutrin?) and/or formal counseling; elected counseling, information provided, come back in 3 months for possibly changes in her medication. Cough: As described above, she does have a history of chronic cough for years.  Recommend to prevent cough by position herself right when she goes to bed. RTC 3 months

## 2018-04-26 ENCOUNTER — Other Ambulatory Visit: Payer: Self-pay | Admitting: Internal Medicine

## 2018-04-26 ENCOUNTER — Ambulatory Visit
Admission: RE | Admit: 2018-04-26 | Discharge: 2018-04-26 | Disposition: A | Discharge status: Home / Self Care | Payer: 59 | Source: Ambulatory Visit | Attending: Internal Medicine | Admitting: Internal Medicine

## 2018-04-26 ENCOUNTER — Ambulatory Visit: Payer: 59

## 2018-04-26 DIAGNOSIS — R921 Mammographic calcification found on diagnostic imaging of breast: Secondary | ICD-10-CM

## 2018-04-26 IMAGING — MG DIGITAL DIAGNOSTIC BILATERAL MAMMOGRAM WITH TOMO AND CAD
8 of 14 series · 8 of 40 positions shown · non-contrast
Comparison: Prior mammogram dated [DATE].

CLINICAL DATA: Patient complains of diffuse enlargement of the left
breast.Patient complains of focal pain and palpable abnormalities in
the medial and lateral aspect of the left breast. Patient had a
stroke and cannot lift her left arm.

EXAM:
DIGITAL DIAGNOSTIC BILATERAL MAMMOGRAM WITH CAD AND TOMO
ULTRASOUND LEFT BREAST

[L CC]
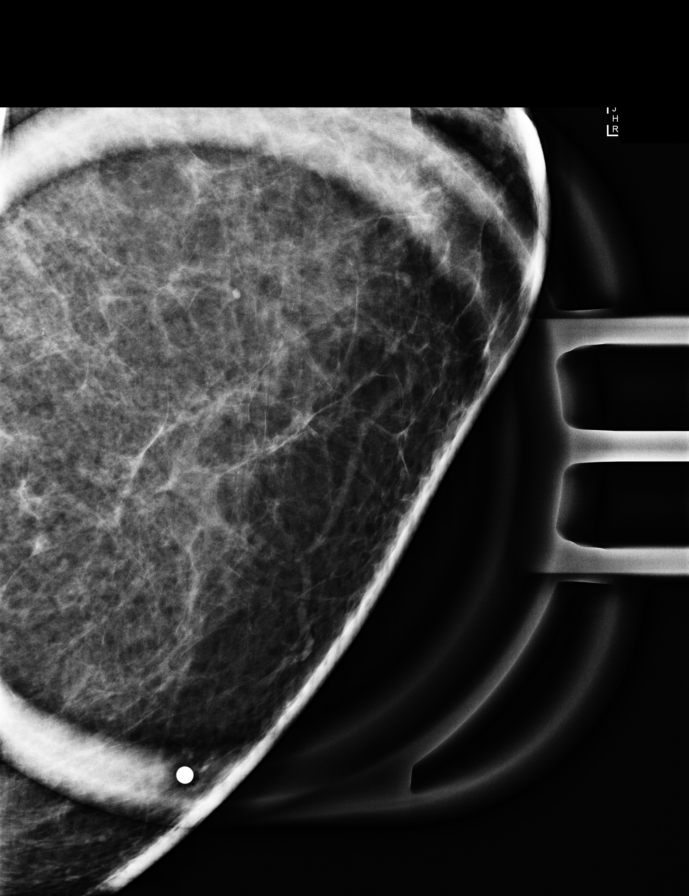

[R CC synth-2D]
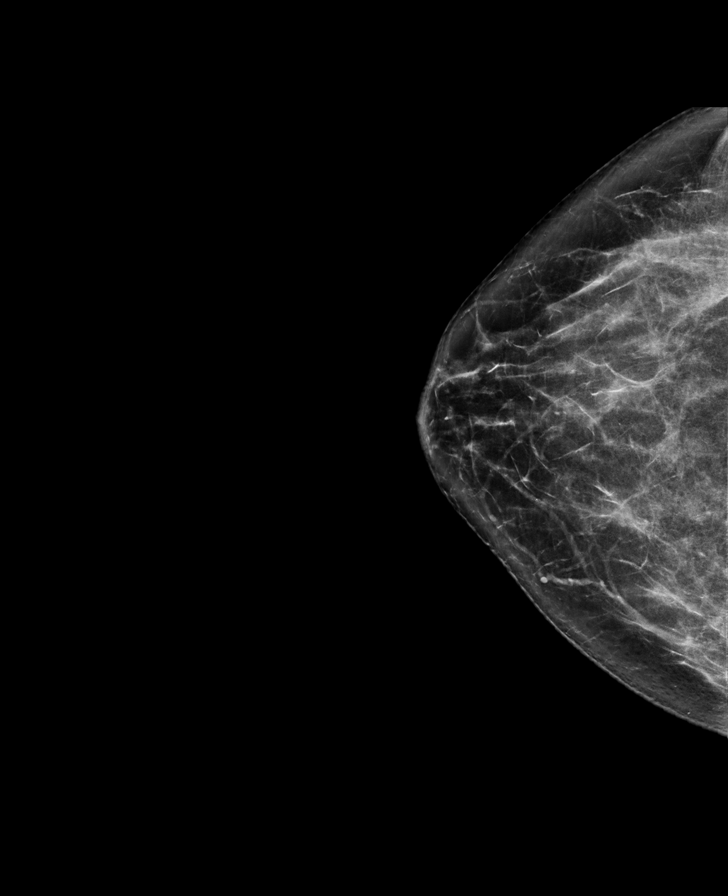

[L TAN synth-2D]
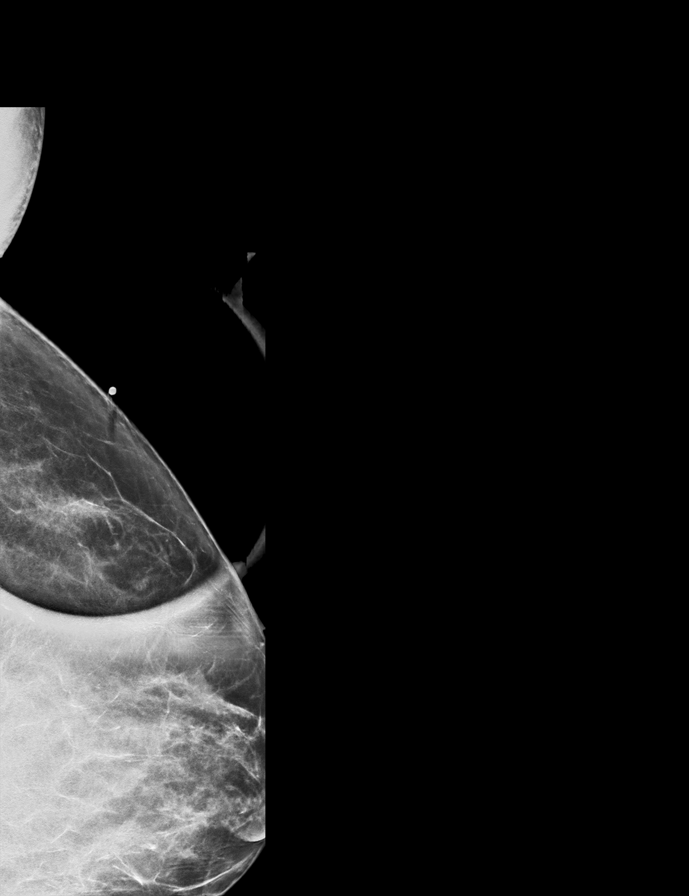

[R MLO synth-2D]
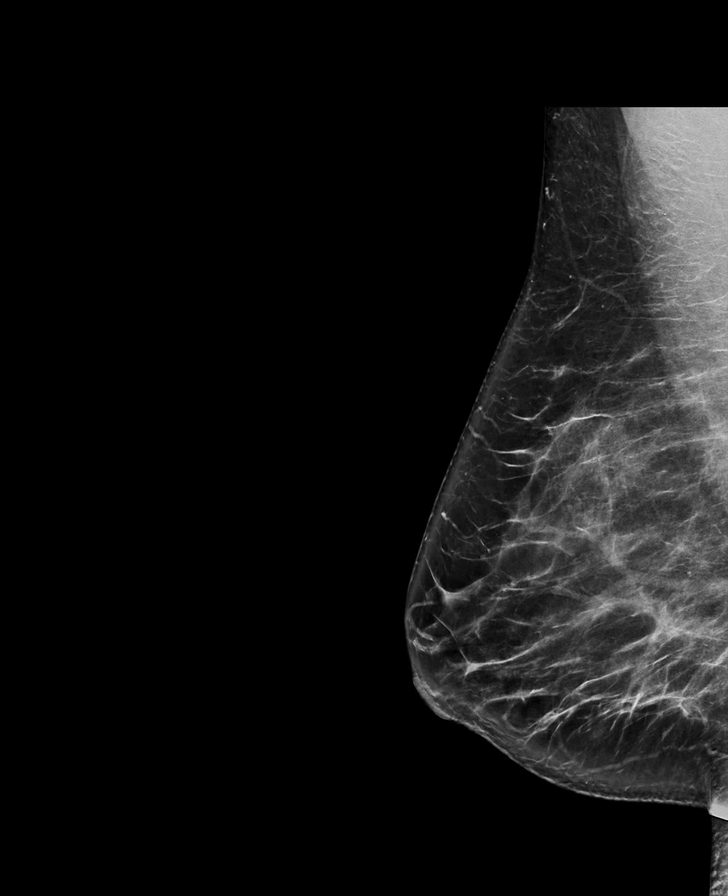

[L ML synth-2D]
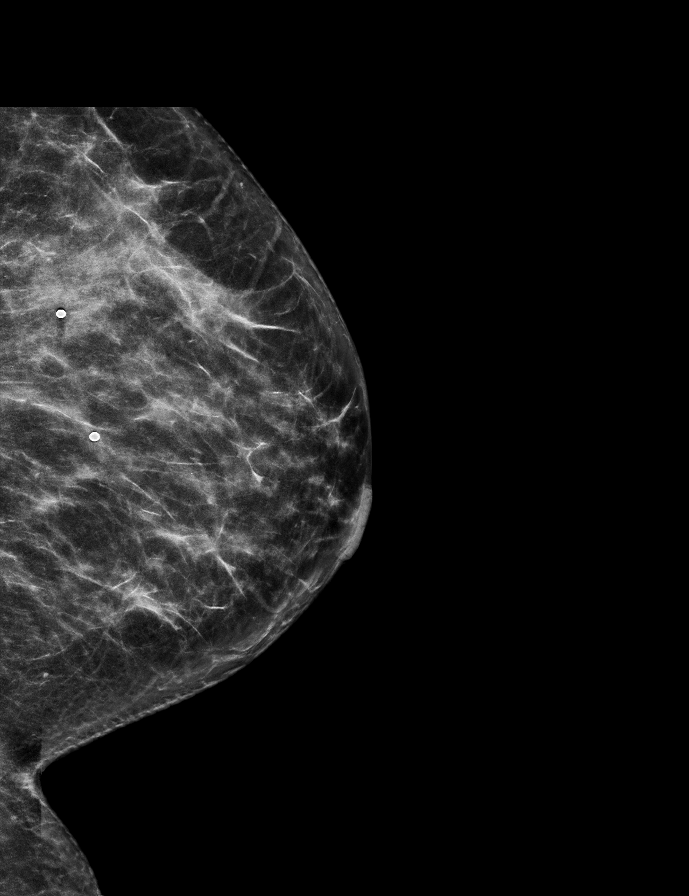

[L MLO synth-2D]
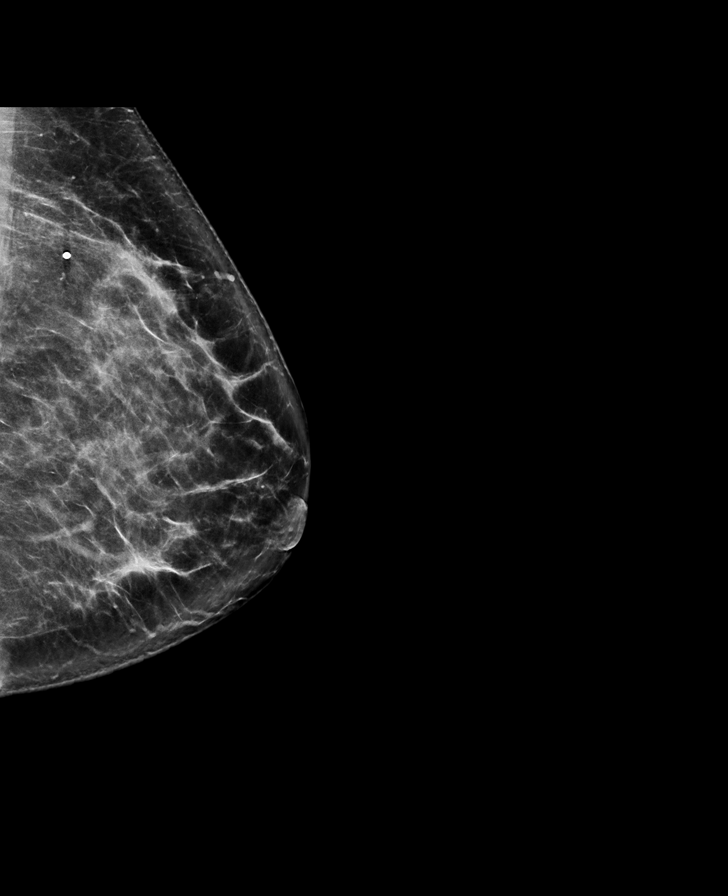

[L CC synth-2D]
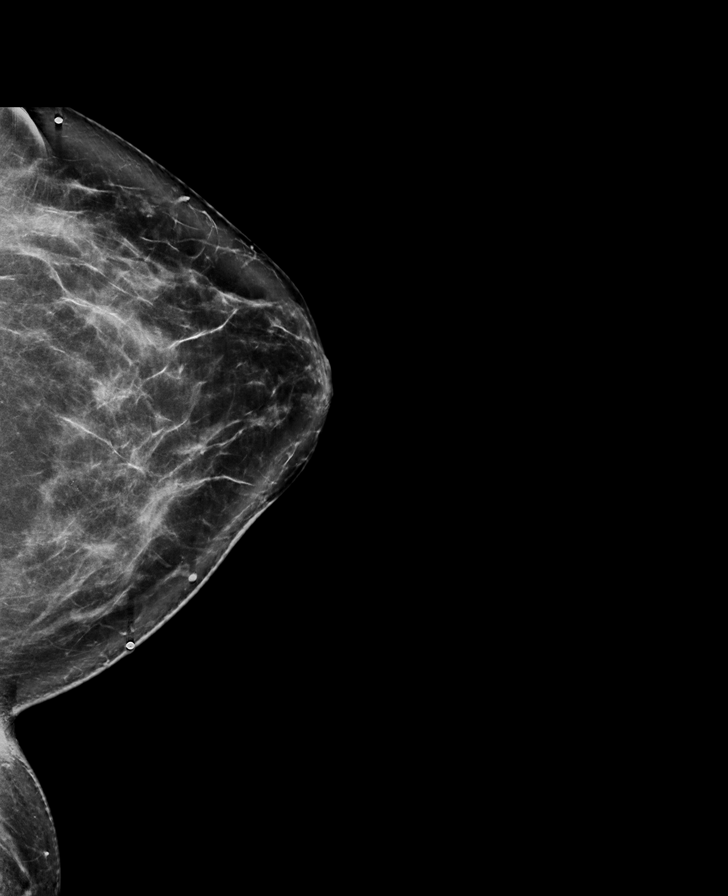

[R MLO tomo · tomo slice 43/84.0]
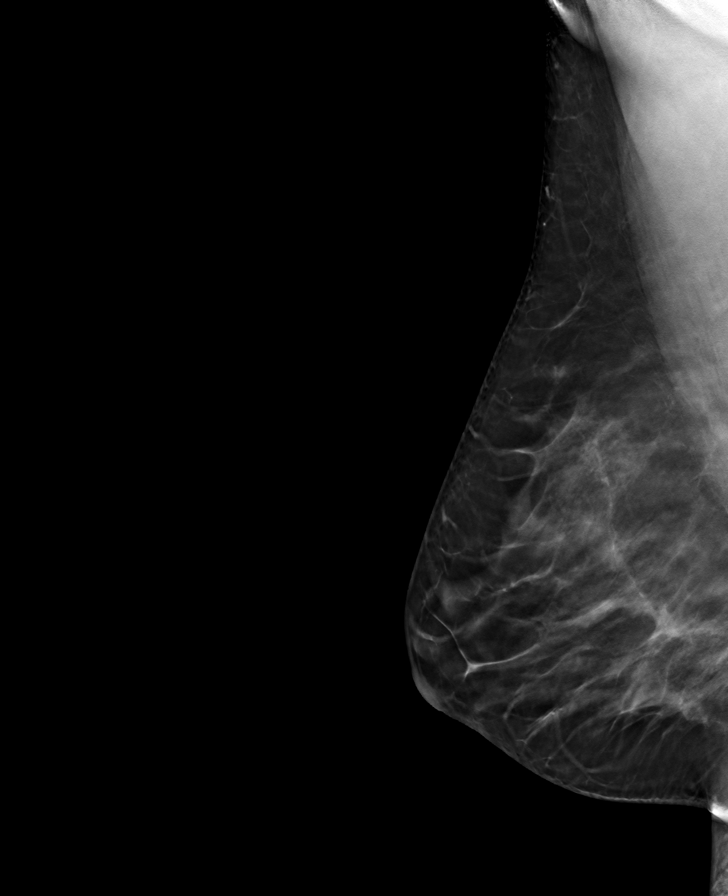

[8 of 40 positions shown; findings below may reference images not displayed]

ACR Breast Density Category c: The breast tissue is heterogeneously
dense, which may obscure small masses.
FINDINGS: There are 4 mm grouped calcifications in the medial aspect of the
left breast only seen on the CC view. On the tomographic images the
calcifications are located in the inferior aspect of the breast and
may be located within the skin. They are felt to likely be benign.
No suspicious mass identified in either breast.

Mammographic images were processed with CAD.

On physical exam, I do not palpate a mass in the areas of clinical
concern in the medial or lateral aspect of the left breast.

Targeted ultrasound is performed, showing normal tissue in the areas
of clinical concern in the 3 o'clock and 9 o'clock region of the
left breast. No solid or cystic mass, abnormal shadowing or
distortion visualized.
IMPRESSION: Probable benign calcifications in the medial aspect of the left
breast.

RECOMMENDATION:
Short-term interval follow-up left mammogram in 6 months is
recommended.

I have discussed the findings and recommendations with the patient.
Results were also provided in writing at the conclusion of the
visit. If applicable, a reminder letter will be sent to the patient
regarding the next appointment.

BI-RADS CATEGORY  3: Probably benign.

## 2018-05-05 ENCOUNTER — Encounter: Payer: Self-pay | Admitting: Physical Medicine & Rehabilitation

## 2018-05-05 ENCOUNTER — Encounter: Payer: 59 | Attending: Physical Medicine & Rehabilitation | Admitting: Physical Medicine & Rehabilitation

## 2018-05-05 VITALS — BP 148/96 | HR 60 | Ht 59.5 in | Wt 172.0 lb

## 2018-05-05 DIAGNOSIS — G43909 Migraine, unspecified, not intractable, without status migrainosus: Secondary | ICD-10-CM | POA: Diagnosis not present

## 2018-05-05 DIAGNOSIS — I69154 Hemiplegia and hemiparesis following nontraumatic intracerebral hemorrhage affecting left non-dominant side: Secondary | ICD-10-CM

## 2018-05-05 DIAGNOSIS — I10 Essential (primary) hypertension: Secondary | ICD-10-CM | POA: Insufficient documentation

## 2018-05-05 DIAGNOSIS — R252 Cramp and spasm: Secondary | ICD-10-CM | POA: Diagnosis not present

## 2018-05-05 DIAGNOSIS — R58 Hemorrhage, not elsewhere classified: Secondary | ICD-10-CM | POA: Insufficient documentation

## 2018-05-05 DIAGNOSIS — I69254 Hemiplegia and hemiparesis following other nontraumatic intracranial hemorrhage affecting left non-dominant side: Secondary | ICD-10-CM | POA: Diagnosis not present

## 2018-05-05 DIAGNOSIS — E785 Hyperlipidemia, unspecified: Secondary | ICD-10-CM | POA: Insufficient documentation

## 2018-05-05 NOTE — Progress Notes (Signed)
Botox: Procedure Note Patient Name: Abigail Campos DOB: 03/28/1971 MRN: [id number] Date: 05/05/18  Procedure: Botulinum toxin administration Guidance: EMG Diagnosis: Spastic left nondominant IPH Attending: Delice Lesch, MD   Informed consent: Risks, benefits & options of the procedure are explained to the patient (and/or family). The patient elects to proceed with procedure. Risks include but are not limited to weakness, respiratory distress, dry mouth, ptosis, antibody formation, worsening of some areas of function. Benefits include decreased abnormal muscle tone, improved hygiene and positioning, decreased skin breakdown and, in some cases, decreased pain. Options include conservative management with oral antispasticity agents, phenol chemodenervation of nerve or at motor nerve branches. More invasive options include intrathecal balcofen adminstration for appropriate candidates. Surgical options may include tendon lengthening or transposition or, rarely, dorsal rhizotomy.   History/Physical Examination: 47 y.o.  Year old right-handed female with history of hypertension, migraine headaches presents for follow up after right basal ganglia hemorrhage.   Modified Ashworth: Left 1/4 elbow flexors, 1+/4 wrist flexors, 3/4 left finger flexors, 2/4 ankle plantar flexors.  Previous Treatments: Therapy/Range of motion Indication for guidance: Target active muscules  Procedure: Botulinum toxin was mixed with preservative free saline with a dilution of 1cc to 100 units. Targeted limb and muscles were identified. The skin was prepped with alcohol swabs and placement of needle tip in targeted muscle was confirmed using appropriate guidance. Prior to injection, positioning of needle tip outside of blood vessel was determined by pulling back on syringe plunger.  MUSCLE UNITS  Left Med biceps 100 Left FCR 50                       Left FCU 50 Left FDS 100 Left FDP 100  Left Med gastroc: 100   Total  units used: 403  Complications: None  Plan: Follow up in 6 weeks  Conleigh Heinlein Anil Finnbar Cedillos 1:12 PM

## 2018-05-19 ENCOUNTER — Encounter: Payer: Self-pay | Admitting: Physical Medicine & Rehabilitation

## 2018-05-19 ENCOUNTER — Encounter: Payer: 59 | Attending: Physical Medicine & Rehabilitation | Admitting: Physical Medicine & Rehabilitation

## 2018-05-19 VITALS — BP 139/97 | HR 66 | Ht 59.0 in | Wt 173.0 lb

## 2018-05-19 DIAGNOSIS — I69254 Hemiplegia and hemiparesis following other nontraumatic intracranial hemorrhage affecting left non-dominant side: Secondary | ICD-10-CM | POA: Diagnosis not present

## 2018-05-19 DIAGNOSIS — E785 Hyperlipidemia, unspecified: Secondary | ICD-10-CM | POA: Insufficient documentation

## 2018-05-19 DIAGNOSIS — I10 Essential (primary) hypertension: Secondary | ICD-10-CM | POA: Insufficient documentation

## 2018-05-19 DIAGNOSIS — R252 Cramp and spasm: Secondary | ICD-10-CM | POA: Diagnosis not present

## 2018-05-19 DIAGNOSIS — G43909 Migraine, unspecified, not intractable, without status migrainosus: Secondary | ICD-10-CM | POA: Insufficient documentation

## 2018-05-19 DIAGNOSIS — M6530 Trigger finger, unspecified finger: Secondary | ICD-10-CM

## 2018-05-19 DIAGNOSIS — R58 Hemorrhage, not elsewhere classified: Secondary | ICD-10-CM | POA: Diagnosis not present

## 2018-05-19 NOTE — Progress Notes (Signed)
Stenosing tenosynovitis injection  Indication: Trigger finger pain not relieved by conservative care.  Informed consent was obtained after describing risks and benefits of the procedure with the patient, this includes bleeding, bruising, infection and medication side effects. The patient wishes to proceed and has given written consent. Patient was placed in a seated position with volar hand facing up. The right first digit A1 pulley was marked and prepped with betadine. Vapocoolant spray was applied. A 27-gauge 1-1/2 inch needle was inserted, directed into the tendon and then slightly withdrawn.  After negative draw back for blood, a solution containing 0.25 mL of 6 mg per ML celestone and 0.25 mL of 2% lidocaine was injected. A band aid was applied. The patient tolerated the procedure well. Post procedure instructions were given.

## 2018-05-30 ENCOUNTER — Other Ambulatory Visit: Payer: Self-pay | Admitting: Internal Medicine

## 2018-06-15 ENCOUNTER — Encounter: Payer: Self-pay | Admitting: Physical Medicine & Rehabilitation

## 2018-06-15 ENCOUNTER — Encounter: Payer: 59 | Attending: Physical Medicine & Rehabilitation | Admitting: Physical Medicine & Rehabilitation

## 2018-06-15 VITALS — BP 143/88 | HR 56 | Ht 59.5 in | Wt 172.0 lb

## 2018-06-15 DIAGNOSIS — I69398 Other sequelae of cerebral infarction: Secondary | ICD-10-CM

## 2018-06-15 DIAGNOSIS — E785 Hyperlipidemia, unspecified: Secondary | ICD-10-CM | POA: Insufficient documentation

## 2018-06-15 DIAGNOSIS — I69959 Hemiplegia and hemiparesis following unspecified cerebrovascular disease affecting unspecified side: Secondary | ICD-10-CM | POA: Diagnosis not present

## 2018-06-15 DIAGNOSIS — M65311 Trigger thumb, right thumb: Secondary | ICD-10-CM

## 2018-06-15 DIAGNOSIS — G43909 Migraine, unspecified, not intractable, without status migrainosus: Secondary | ICD-10-CM | POA: Insufficient documentation

## 2018-06-15 DIAGNOSIS — R269 Unspecified abnormalities of gait and mobility: Secondary | ICD-10-CM | POA: Diagnosis not present

## 2018-06-15 DIAGNOSIS — I10 Essential (primary) hypertension: Secondary | ICD-10-CM | POA: Insufficient documentation

## 2018-06-15 DIAGNOSIS — I69154 Hemiplegia and hemiparesis following nontraumatic intracerebral hemorrhage affecting left non-dominant side: Secondary | ICD-10-CM

## 2018-06-15 DIAGNOSIS — R252 Cramp and spasm: Secondary | ICD-10-CM | POA: Diagnosis not present

## 2018-06-15 DIAGNOSIS — R58 Hemorrhage, not elsewhere classified: Secondary | ICD-10-CM | POA: Diagnosis not present

## 2018-06-15 DIAGNOSIS — I69254 Hemiplegia and hemiparesis following other nontraumatic intracranial hemorrhage affecting left non-dominant side: Secondary | ICD-10-CM | POA: Diagnosis not present

## 2018-06-15 NOTE — Progress Notes (Signed)
Subjective:    Patient ID: Abigail Campos, female    DOB: 12/26/1970, 47 y.o.   MRN: [id number]  HPI:  47 year old right-handed female with history of hypertension, migraine headaches presents for follow up after right basal ganglia hemorrhage.   Last clinic visit 05/19/18.  At that time, pt had trigger finger injection.  Prior to that she had Botox iinjection.  She was evaluated by orthotist.  Discussed bracing with orthotist. She is now using a quad cane.  She notes locking in her finger at times, but no pain.   Pain Inventory Average Pain 0 Pain Right Now 0 My pain is no pain  In the last 24 hours, has pain interfered with the following? General activity 0 Relation with others 0 Enjoyment of life 0 What TIME of day is your pain at its worst? no pain Sleep (in general) Poor  Pain is worse with: no pain Pain improves with: no pain Relief from Meds: 0  Mobility walk with assistance use a cane how many minutes can you walk? 40 ability to climb steps?  yes do you drive?  no Do you have any goals in this area?  no  Function disabled: date disabled .  Neuro/Psych bladder control problems trouble walking  Prior Studies Any changes since last visit?  no  Physicians involved in your care Any changes since last visit?  no   Family History  Problem Relation Age of Onset  . Diabetes Mother   . Hypertension Mother   . Glaucoma Mother   . Colon cancer Mother        mother age 68  . Stroke Paternal Grandfather   . Heart disease Unknown        2 uncles died 04/03/11 from CAD-CHF  . Stroke Maternal Grandfather   . Diabetes Other        siblings  . Hypertension Other        siblings  . Breast cancer Neg Hx    Social History   Socioeconomic History  . Marital status: Married    Spouse name: Not on file  . Number of children: 2  . Years of education: Not on file  . Highest education level: Not on file  Occupational History  . Occupation: disable d/t  Architectural technologist: [employer]  . Financial resource strain: Not on file  . Food insecurity:    Worry: Not on file    Inability: Not on file  . Transportation needs:    Medical: Not on file    Non-medical: Not on file  Tobacco Use  . Smoking status: Never Smoker  . Smokeless tobacco: Never Used  Substance and Sexual Activity  . Alcohol use: No  . Drug use: No  . Sexual activity: Not on file  Lifestyle  . Physical activity:    Days per week: Not on file    Minutes per session: Not on file  . Stress: Not on file  Relationships  . Social connections:    Talks on phone: Not on file    Gets together: Not on file    Attends religious service: Not on file    Active member of club or organization: Not on file    Attends meetings of clubs or organizations: Not on file    Relationship status: Not on file  Other Topics Concern  . Not on file  Social History Narrative   Used to live independently, stroke 03-2016, d/c from  rehab to his mother's house 04-2016, then moved back w/ her husband as off 04/2018   Daughter finished [employer], one son, both @ home      Past Surgical History:  Procedure Laterality Date  . ABDOMINAL HYSTERECTOMY  11-14-07   no oophorectomy per surgical report  . CESAREAN SECTION     [id number]  . INGUINAL HERNIA REPAIR     2004   Past Medical History:  Diagnosis Date  . Essential hypertension 10/19/2007   03-2010: metoprolol changed to bystolic (was not feeling well on it, no specific allergy or reaction)    . Hyperlipemia   . Hypertension   . Stroke ([place])    BP (!) 143/88   Pulse (!) 56   Ht 4' 11.5" (1.511 m)   Wt 172 lb (78 kg)   SpO2 95%   BMI 34.16 kg/m   Opioid Risk Score:   Fall Risk Score:  `1  Depression screen PHQ 2/9  Depression screen [facility] 2/9 01/28/2018 08/13/2016 06/10/2016 05/15/2016 09/25/2015 07/17/2015  Decreased Interest 0 0 0 0 0 0  Down, Depressed, Hopeless 0 0 0 1 0 0  PHQ - 2 Score 0 0 0 1 0 0    Altered sleeping - - - 1 - -  Tired, decreased energy - - - 3 - -  Change in appetite - - - 0 - -  Feeling bad or failure about yourself  - - - 0 - -  Trouble concentrating - - - 0 - -  Moving slowly or fidgety/restless - - - 0 - -  Suicidal thoughts - - - 0 - -  PHQ-9 Score - - - 5 - -  Some recent data might be hidden    Review of Systems  Musk: Gait abnormality Neurological: Positive for facial asymmetry, speech difficulty and weakness. Negative for numbness.  All other systems reviewed and are negative.     Objective:     Physical Exam Constitutional: She appears well-developed.  NAD HENT:  Normocephalic and atraumatic.   Eyes: EOM are normal.  No discharge. Cardiovascular: RRR. No JVD. Respiratory: Effort normal and breath sounds normal. No respiratory distress.   GI: Bowel sounds are normal. She exhibits no distension.  Musculoskeletal:  Trigger thumb on right  Neurological: She is Alert and oriented 3.  Expressive difficulties with increased speed, improving Dysarthria  Left facial weakness Motor: RUE/RLE: 5/5 proximal to distal.   LLE: Hip flexion 4/5, knee extension 4/5, ADF/PF 1/5 Left upper extremity: Shoulder abduction 2/5, elbow flex 2/5, elbow ext 2-/5, hand grip 3/5.  Modified Ashworth: Left 1/4 elbow flexors, wrist flexors 1+/5, 2/4 left finger flexors, 1+/4 ankle plantar flexors.  Skin: Skin is warm and dry.  Psychiatric: Normal mood and behavior    Assessment & Plan:  47 year old right-handed female with history of hypertension, migraine headaches presents for follow up after right basal ganglia hemorrhage.    1. 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 Gait abnormality  Cont quad cane  Therapies ordered  3. Right trigger thumb  Benefit, still has locking occasionally, but no pain  Overuse injury as well with positioning  Pt to consider surgery referral in future

## 2018-06-17 ENCOUNTER — Telehealth: Payer: Self-pay | Admitting: Physical Medicine & Rehabilitation

## 2018-06-17 DIAGNOSIS — I69154 Hemiplegia and hemiparesis following nontraumatic intracerebral hemorrhage affecting left non-dominant side: Secondary | ICD-10-CM

## 2018-06-17 NOTE — Telephone Encounter (Signed)
New Message  Pt verbalized she is needing a referral for therapy, she tried calling to scheduled but the office verbalized to resubmitted referral.  Please f/u

## 2018-06-18 ENCOUNTER — Emergency Department (HOSPITAL_BASED_OUTPATIENT_CLINIC_OR_DEPARTMENT_OTHER): Payer: 59

## 2018-06-18 ENCOUNTER — Encounter (HOSPITAL_BASED_OUTPATIENT_CLINIC_OR_DEPARTMENT_OTHER): Payer: Self-pay | Admitting: Emergency Medicine

## 2018-06-18 ENCOUNTER — Other Ambulatory Visit: Payer: Self-pay

## 2018-06-18 ENCOUNTER — Emergency Department (HOSPITAL_BASED_OUTPATIENT_CLINIC_OR_DEPARTMENT_OTHER)
Admission: EM | Admit: 2018-06-18 | Discharge: 2018-06-18 | Disposition: A | Discharge status: Home / Self Care | Payer: 59 | Attending: Emergency Medicine | Admitting: Emergency Medicine

## 2018-06-18 DIAGNOSIS — Y929 Unspecified place or not applicable: Secondary | ICD-10-CM | POA: Diagnosis not present

## 2018-06-18 DIAGNOSIS — Y939 Activity, unspecified: Secondary | ICD-10-CM | POA: Insufficient documentation

## 2018-06-18 DIAGNOSIS — Y33XXXA Other specified events, undetermined intent, initial encounter: Secondary | ICD-10-CM | POA: Diagnosis not present

## 2018-06-18 DIAGNOSIS — Z79899 Other long term (current) drug therapy: Secondary | ICD-10-CM | POA: Diagnosis not present

## 2018-06-18 DIAGNOSIS — S90112A Contusion of left great toe without damage to nail, initial encounter: Secondary | ICD-10-CM | POA: Diagnosis not present

## 2018-06-18 DIAGNOSIS — Z7902 Long term (current) use of antithrombotics/antiplatelets: Secondary | ICD-10-CM | POA: Insufficient documentation

## 2018-06-18 DIAGNOSIS — Y998 Other external cause status: Secondary | ICD-10-CM | POA: Insufficient documentation

## 2018-06-18 DIAGNOSIS — Z8673 Personal history of transient ischemic attack (TIA), and cerebral infarction without residual deficits: Secondary | ICD-10-CM | POA: Insufficient documentation

## 2018-06-18 DIAGNOSIS — I10 Essential (primary) hypertension: Secondary | ICD-10-CM | POA: Insufficient documentation

## 2018-06-18 DIAGNOSIS — S99922A Unspecified injury of left foot, initial encounter: Secondary | ICD-10-CM | POA: Diagnosis present

## 2018-06-18 IMAGING — DX DG FOOT COMPLETE 3+V*L*
3 series · 3 of 3 positions shown · non-contrast
Comparison: None.

CLINICAL DATA: Bleeding from LEFT great toe.

EXAM:
LEFT FOOT - COMPLETE 3+ VIEW

[foot ap]
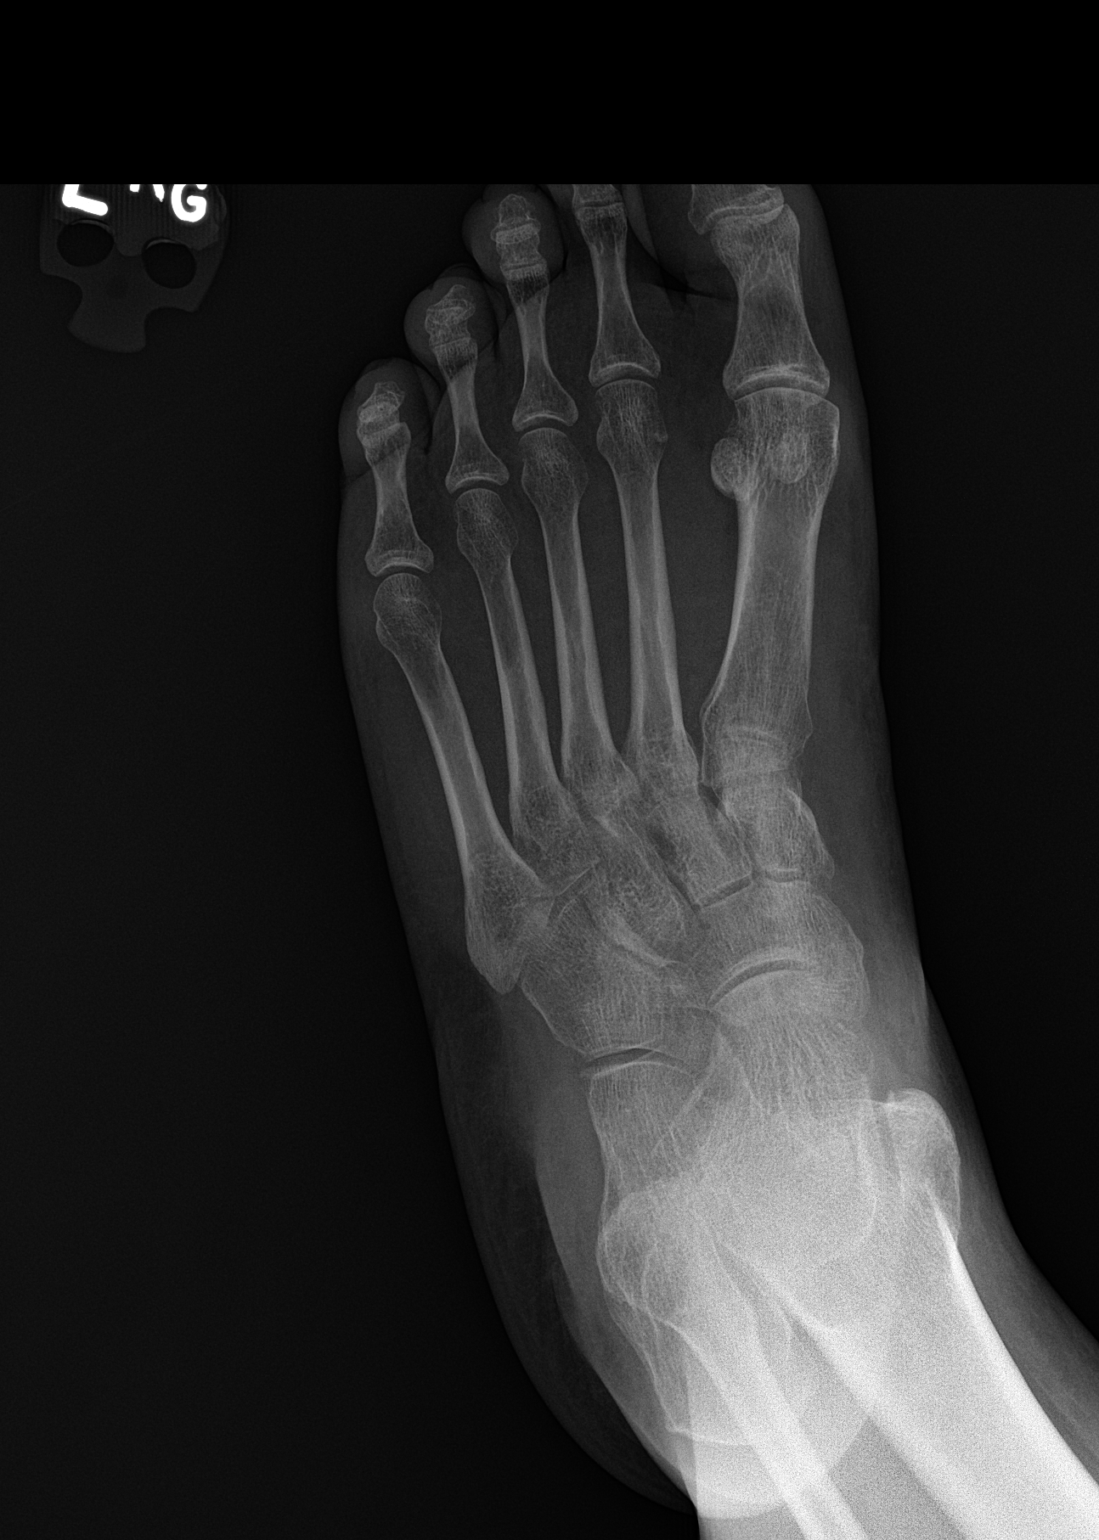

[foot obl]
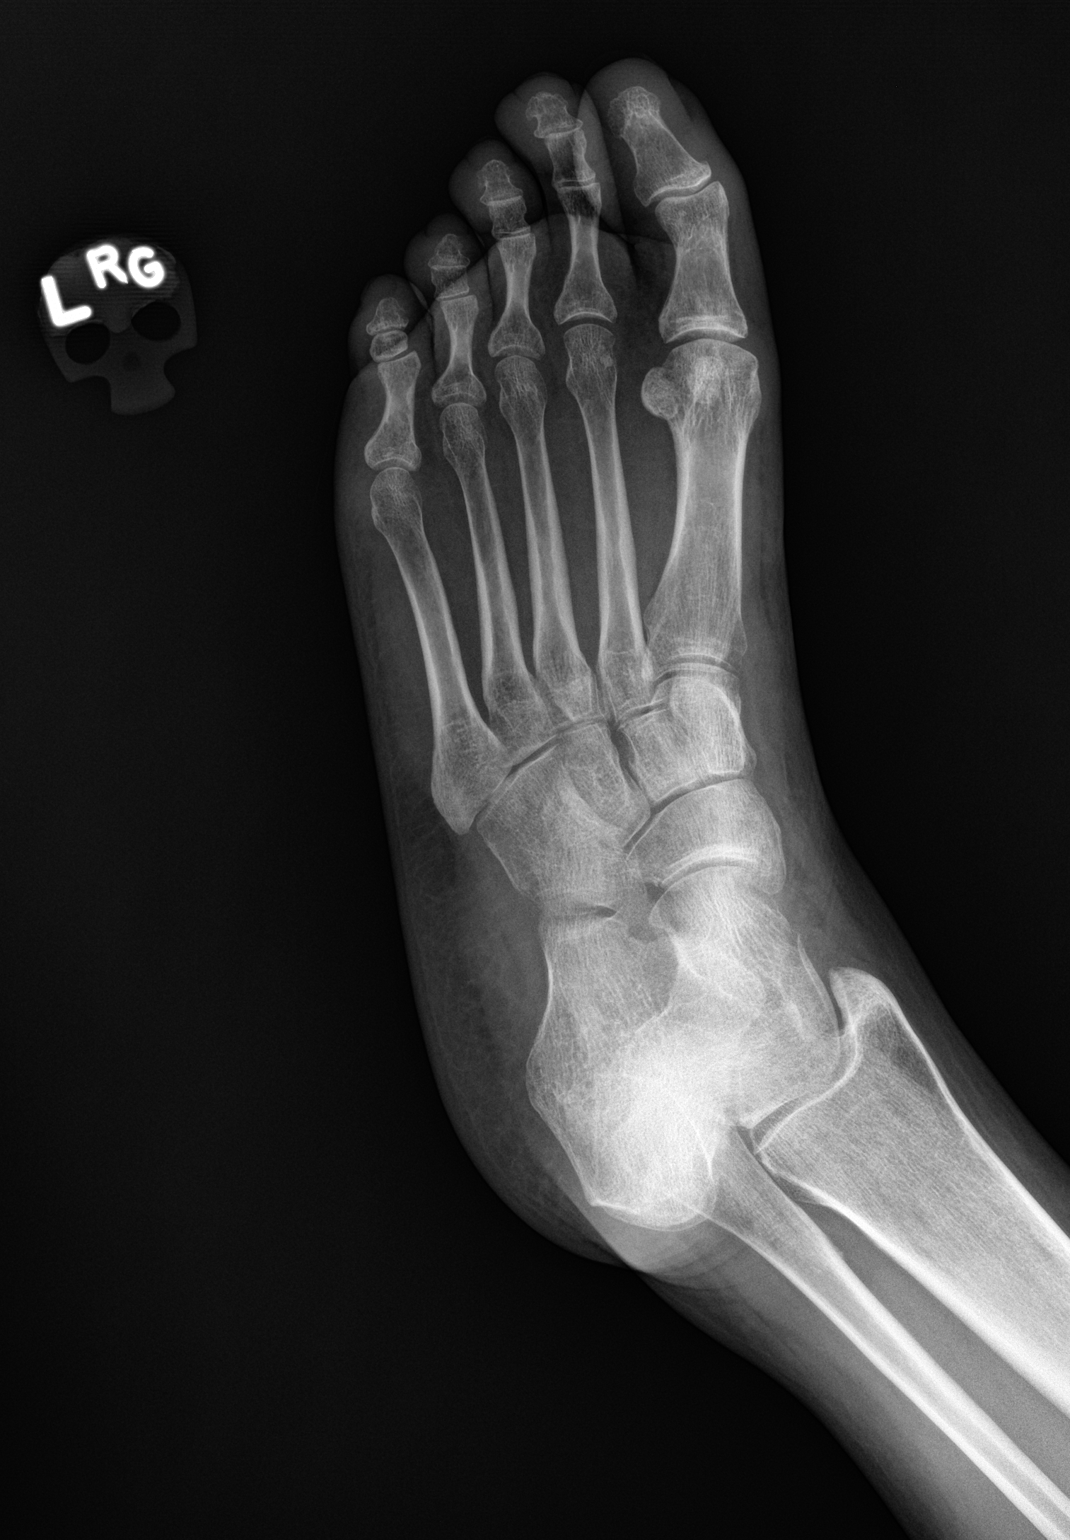

[foot lat]
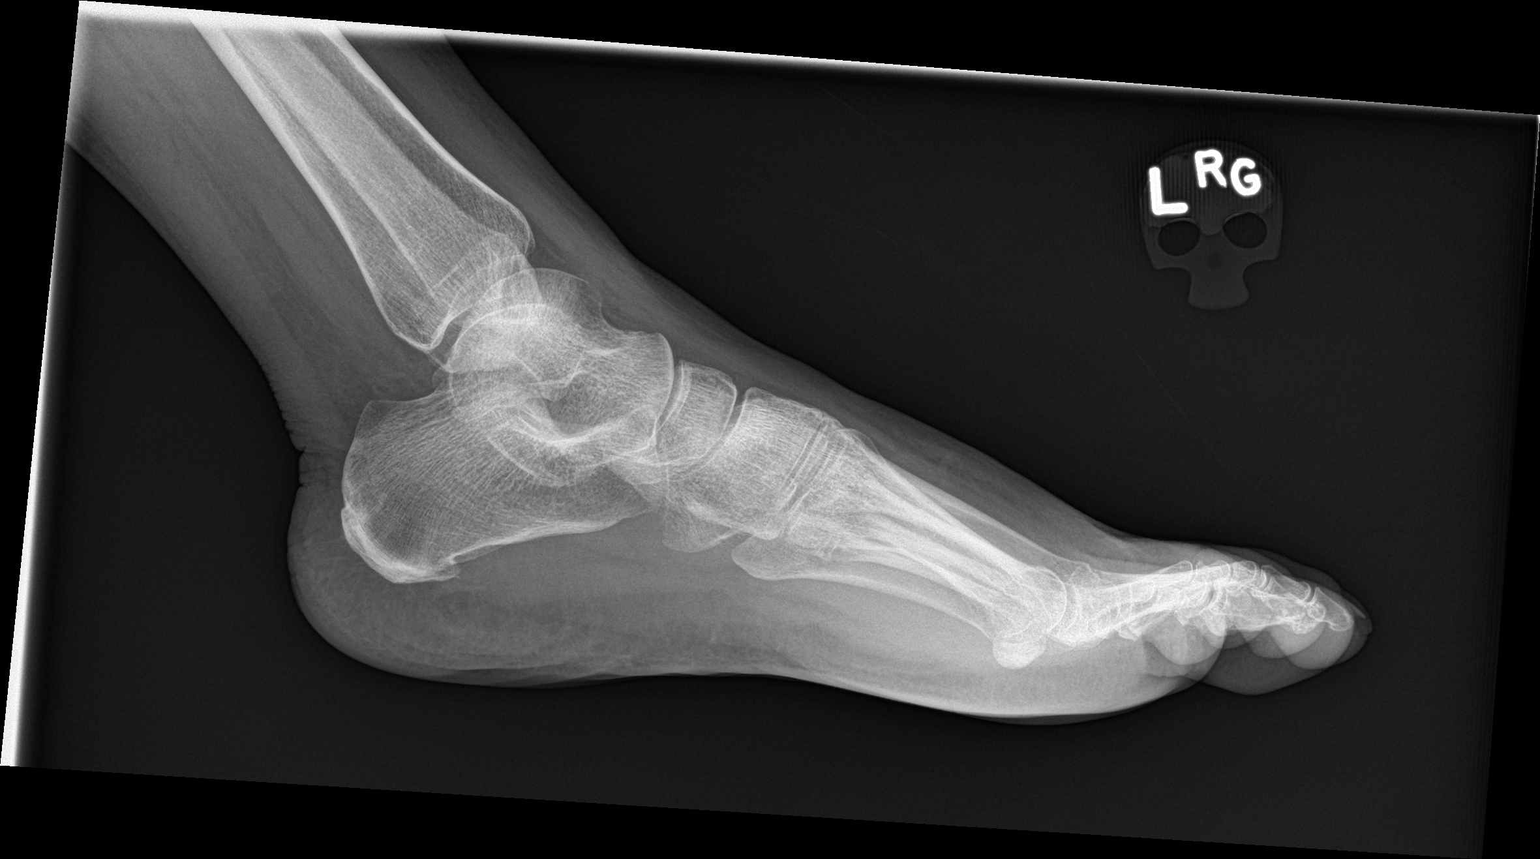

[3 of 3 positions shown; findings below may reference images not displayed]

FINDINGS: There is no evidence of fracture or dislocation. There is no
evidence of arthropathy or other focal bone abnormality. Mild soft
tissue swelling.
IMPRESSION: No fracture or dislocation.  Mild soft tissue swelling.

## 2018-06-18 NOTE — Discharge Instructions (Addendum)
Wash and dry your foot daily.  You can apply bacitracin ointment to your toe twice daily.  Get rechecked if you develop pain, increased redness, increased swelling or new concerning symptoms.

## 2018-06-18 NOTE — ED Provider Notes (Signed)
[facility] EMERGENCY DEPARTMENT Provider Note   [name]: [id number] Arrival date & time: 06/18/18  1419     History   Chief Complaint Chief Complaint  Patient presents with  . Wound Check    HPI CIRE DEYARMIN is a 47 y.o. female.  The history is provided by the patient. No language interpreter was used.  Wound Check     LENEE FRANZE is a 47 y.o. female who presents to the Emergency Department complaining of wound check. He has a history of hypertension and prior hemorrhagic stroke and reports swelling to the left foot for the last three days. No reports of injuries but she does not have feeling in her left leg. She typically ambulates with a walker. Three days ago she noticed that there was some swelling to the left foot. Yesterday there was a small amount of bleeding from the left great toe. The bleeding to stop only to recur today and there is increased discoloration around the toe. No fevers, chills, chest pain, shortness of breath. No prior similar symptoms. She has no history of diabetes.  No history of DVT or PE. No history of bleeding gums, hematochezia, melena.  Past Medical History:  Diagnosis Date  . Essential hypertension 10/19/2007   03-2010: metoprolol changed to bystolic (was not feeling well on it, no specific allergy or reaction)    . Hyperlipemia   . Hypertension   . Stroke [facility])     Patient Active Problem List   Diagnosis Date Noted  . Spastic hemiparesis affecting dominant side ([place]) 01/04/2018  . Hyperglycemia 12/29/2016  . Depression 12/29/2016  . Muscle spasticity   . Obesity (BMI 30.0-34.9) 04/08/2016  . HLD (hyperlipidemia) 04/08/2016  . Basal ganglia hemorrhage ([place]) 04/08/2016  . Dysphagia as late effect of cerebrovascular disease   . Migraine with aura and without status migrainosus, not intractable   . Gait disturbance, post-stroke   . Hemiplegia, post-stroke ([place])   . Aphasia, post-stroke   . Dysarthria, post-stroke   . ICH  (intracerebral hemorrhage) ([facility]) - R basal ganglia due to hypertensive emergency 04/01/2016  . Upper airway cough syndrome 12/06/2015  . PCP NOTES >>>>>>>>>>>>>>>>>>>>>>>>>>>.. 09/25/2015  . Insomnia 12/28/2012  . Annual physical exam 11/23/2011  . Essential hypertension 10/19/2007    Past Surgical History:  Procedure Laterality Date  . ABDOMINAL HYSTERECTOMY  11-14-07   no oophorectomy per surgical report  . CESAREAN SECTION     [id number]  . INGUINAL HERNIA REPAIR     2004     OB History    Gravida  2   Para  2   Term      Preterm      AB      Living        SAB      TAB      Ectopic      Multiple      Live Births               Home Medications    Prior to Admission medications   Medication Sig Start Date End Date Taking? Authorizing Provider  atorvastatin (LIPITOR) 20 MG tablet Take 1 tablet (20 mg total) by mouth daily at 6 PM. 12/17/17   Colon Branch, MD  baclofen (LIORESAL) 10 MG tablet Take 1 tablet (10 mg total) by mouth 3 (three) times daily. 03/18/18   Colon Branch, MD  clopidogrel (PLAVIX) 75 MG tablet Take 1 tablet (75 mg total) by mouth  daily. 12/17/17   Colon Branch, MD  escitalopram (LEXAPRO) 10 MG tablet Take 1.5 tablets (15 mg total) by mouth daily. 12/17/17   Colon Branch, MD  furosemide (LASIX) 20 MG tablet Take 1 tablet (20 mg total) by mouth daily. 12/17/17   Colon Branch, MD  lisinopril (PRINIVIL,ZESTRIL) 2.5 MG tablet Take 1 tablet (2.5 mg total) by mouth daily. 12/17/17   Colon Branch, MD  nebivolol (BYSTOLIC) 5 MG tablet Take 1 tablet (5 mg total) by mouth every 12 (twelve) hours. 05/30/18   Colon Branch, MD  traZODone (DESYREL) 50 MG tablet Take 1 tablet (50 mg total) by mouth at bedtime. 05/30/18   Colon Branch, MD    Family History Family History  Problem Relation Age of Onset  . Diabetes Mother   . Hypertension Mother   . Glaucoma Mother   . Colon cancer Mother        mother age 62  . Stroke Paternal Grandfather   . Heart disease Unknown         2 uncles died 2011-03-15 from CAD-CHF  . Stroke Maternal Grandfather   . Diabetes Other        siblings  . Hypertension Other        siblings  . Breast cancer Neg Hx     Social History Social History   Tobacco Use  . Smoking status: Never Smoker  . Smokeless tobacco: Never Used  Substance Use Topics  . Alcohol use: No  . Drug use: No     Allergies   Bee venom; Peanut-containing drug products; Hydrocodone; Isovue [iopamidol]; Ambien [zolpidem tartrate]; Aspirin; and Latex   Review of Systems Review of Systems  All other systems reviewed and are negative.    Physical Exam Updated Vital Signs BP (!) 149/88 (BP Location: Left Arm)   Pulse 68   Temp 98.5 F (36.9 C) (Oral)   Resp 18   Ht 4\' 11"  (1.499 m)   Wt 78 kg (172 lb)   SpO2 98%   BMI 34.74 kg/m   Physical Exam  Constitutional: She is oriented to person, place, and time. She appears well-developed and well-nourished.  HENT:  Head: Normocephalic and atraumatic.  Cardiovascular: Normal rate and regular rhythm.  No murmur heard. Pulmonary/Chest: Effort normal and breath sounds normal. No respiratory distress.  Musculoskeletal:  There is mild swelling to the left mid and distal foot without any significant tenderness. 2+ DP pulses bilaterally. There is a small amount of ecchymosis at the base of the left great toe nail. There is a small amount of dried blood on the medial aspect of the left great toe nail. No open wounds. No erythema.  Neurological: She is alert and oriented to person, place, and time.  Left hemiparesis.  Nursing note and vitals reviewed.    ED Treatments / Results  Labs (all labs ordered are listed, but only abnormal results are displayed) Labs Reviewed - No data to display  EKG None  Radiology Dg Foot Complete Left  Result Date: 06/18/2018 CLINICAL DATA:  Bleeding from LEFT great toe. EXAM: LEFT FOOT - COMPLETE 3+ VIEW COMPARISON:  None. FINDINGS: There is no evidence of fracture or  dislocation. There is no evidence of arthropathy or other focal bone abnormality. Mild soft tissue swelling. IMPRESSION: No fracture or dislocation.  Mild soft tissue swelling. Electronically Signed   By: Staci Righter M.D.   On: 06/18/2018 15:36    Procedures Procedures (including critical care time)  Medications Ordered in ED Medications - No data to display   Initial Impression / Assessment and Plan / ED Course  I have reviewed the triage vital signs and the nursing notes.  Pertinent labs & imaging results that were available during my care of the patient were reviewed by me and considered in my medical decision making (see chart for details).     Patient with history of CVA here for evaluation of swelling to the left foot as well as bleeding from the left great toe nail. She is not actively bleeding in the emergency department. No current evidence of acute infectious process. Presentation is not consistent with septic arthritis or DVT. Question possible contusion to the toe and foot that she does not recall. Counseled patient on home care with local wound care, bacitracin ointment to the affected toe. Discussed outpatient follow-up and return precautions.  Final Clinical Impressions(s) / ED Diagnoses   Final diagnoses:  Contusion of left great toe without damage to nail, initial encounter    ED Discharge Orders    None       Quintella Reichert, MD 06/18/18 1614

## 2018-06-18 NOTE — ED Notes (Signed)
Pt noted to have decreased sensation to the LLE due to hx of CVA.

## 2018-06-18 NOTE — ED Triage Notes (Signed)
Pt reports she has had bleeding to her L great toe, unsure if there is a wound. Also reports some swelling to her foot.

## 2018-06-21 NOTE — Telephone Encounter (Signed)
This pt is needing a "NEW" referral placed in Epic & I can't do that....  Dr Serita Grit last PT/OT referral in Epic was March 2019.  Thanks, Vilinda Blanks

## 2018-06-22 NOTE — Telephone Encounter (Signed)
New order needs to be placed.

## 2018-07-04 ENCOUNTER — Encounter: Payer: Self-pay | Admitting: Adult Health

## 2018-07-04 ENCOUNTER — Ambulatory Visit (INDEPENDENT_AMBULATORY_CARE_PROVIDER_SITE_OTHER): Payer: 59 | Admitting: Adult Health

## 2018-07-04 VITALS — BP 127/79 | HR 60 | Ht 59.5 in | Wt 173.6 lb

## 2018-07-04 DIAGNOSIS — G8110 Spastic hemiplegia affecting unspecified side: Secondary | ICD-10-CM | POA: Diagnosis not present

## 2018-07-04 DIAGNOSIS — E785 Hyperlipidemia, unspecified: Secondary | ICD-10-CM | POA: Diagnosis not present

## 2018-07-04 DIAGNOSIS — I610 Nontraumatic intracerebral hemorrhage in hemisphere, subcortical: Secondary | ICD-10-CM

## 2018-07-04 DIAGNOSIS — I10 Essential (primary) hypertension: Secondary | ICD-10-CM | POA: Diagnosis not present

## 2018-07-04 DIAGNOSIS — I69359 Hemiplegia and hemiparesis following cerebral infarction affecting unspecified side: Secondary | ICD-10-CM | POA: Diagnosis not present

## 2018-07-04 NOTE — Patient Instructions (Addendum)
Continue clopidogrel 75 mg daily  and lipitor  for secondary stroke prevention  Continue to follow up with PCP regarding cholesterol and blood pressure management   Schedule appointment for outpatient OT  Continue to monitor blood pressure at home  Maintain strict control of hypertension with blood pressure goal below 130/90, diabetes with hemoglobin A1c goal below 6.5% and cholesterol with LDL cholesterol (bad cholesterol) goal below 70 mg/dL. I also advised the patient to eat a healthy diet with plenty of whole grains, cereals, fruits and vegetables, exercise regularly and maintain ideal body weight.  Followup in the future with me as needed or call earlier if needed         Thank you for coming to see [place] at [facility]. I hope we have been able to provide you high quality care today.  You may receive a patient satisfaction survey over the next few weeks. We would appreciate your feedback and comments so that we may continue to improve ourselves and the health of our patients.

## 2018-07-04 NOTE — Progress Notes (Signed)
STROKE NEUROLOGY FOLLOW UP NOTE  NAME: Abigail Campos DOB: July 18, 1971  REASON FOR VISIT: stroke follow up HISTORY FROM: pt and mom and sister  Today we had the pleasure of seeing Abigail Campos in follow-up at our Neurology Clinic. Pt was accompanied by mom and sister.   History Summary Abigail Campos is a 47 y.o. Left handed female with history of HA and HTN was admitted on 04/08/16 for severe HA and L sided weakness. CT showed a R BG hemorrhage. Repeat CT showed stable hematoma. CTA head and neck showed no AVM or aneurysm. TTE EF 60-65%. LDL 113 and A1C 6.0. BP was high on cleviprex initially but later controlled by multiple BP meds. Abigail Campos still has significant left hemiplegia. Abigail Campos was discharged to CIR after stabilization.   Follow up 06/24/16 - the patient has been doing fair. Stayed in CIR for 4 weeks and discharged home. Currently has home PT/OT/speech twice a week, however, pt still has left hemiplegia, not able to move. Abigail Campos also follows with Dr. Posey Pronto and mentioned botox injection but not started yet. BP in good control at home today 127/94. Abigail Campos is currently applying for social security.   Follow-up 09/30/16 - the patient has been doing the same. Still has left hemiplegia, following with Dr. Posey Pronto. On her last visit with Dr. Posey Pronto on 08/13/2016, her Prozac was discontinued. Patient stated that last weekend Abigail Campos had two crying episodes without clear reason. Abigail Campos has not had any more episodes since then. Abigail Campos thought that was due to discontinuation of Prozac, and Abigail Campos would like Prozac to be resumed. I told her the episodes occurred 1.5 months after discontinuation of medication, likely unrelated. But Abigail Campos can further discuss with Dr. Posey Pronto in 1 week at next clinical visit. BP 130/86, currently on Lasix, lisinopril, and bystolic.   Follow up 01/04/18 -  During the interval time, patient has lost follow-up in this clinic.  Had last therapy session in 01/2017 with home therapy,  currently doing self exercises at home.  Also last follow-up with Dr. Posey Pronto in [place] since 12/2016.  Patient still has left hemiparesis, walking with semi-walker.  Sister is having wedding ceremony in 10/2018, Abigail Campos would like to have further improvement on the left hemiparesis before sister's wedding.  BP today 114/68.  07/04/18 UPDATE: Patient is being seen today for 13-month follow-up visit.  Abigail Campos continues to have left hemiparesis with only mild improvement in her left lower extremity.  Abigail Campos currently is using four-point cane for ambulation.  Abigail Campos follows with Dr. Posey Pronto for Botox injections and recent referral to restart OT.  Continues to take Plavix without side effects of bleeding or bruising.  Continues to take Lipitor without side effects myalgias.  Blood pressure today satisfactory 127/79.  Abigail Campos currently lives with her husband, son and daughter.  Abigail Campos needs assistance with stepping into bathtub but otherwise states Abigail Campos is able to maintain all ADLs.  Denies new or worsening stroke/TIA symptoms.    REVIEW OF SYSTEMS: Full 14 system review of systems performed and notable only for those listed below and in HPI above, all others are negative:  Constitutional:   Cardiovascular:  Ear/Nose/Throat: Ringing in ears Skin:  Eyes: Blurred vision Respiratory:   Gastroitestinal:   Genitourinary:  Hematology/Lymphatic:   Endocrine:  Musculoskeletal:  Allergy/Immunology:   Neurological:  weakness Psychiatric:  Sleep:   The following represents the patient's updated allergies and side effects list: Allergies  Allergen Reactions  . Bee Venom Anaphylaxis  . Peanut-Containing  Drug Products Anaphylaxis  . Hydrocodone Hives    Generalize itching w/o rash  . Isovue [Iopamidol] Hives and Itching    Pt broke out with one facial hive.  Had itching on her right upper ant and posterior arm w/o evidence of hives.  Pt given water and 25mg  benadryl po.  We observed pt for 30 minutes before d/c.  J. Bohm    . Ambien  [Zolpidem Tartrate] Other (See Comments)    forgetfulness  . Aspirin Swelling    Reports blisters w/ ASA but pt reports is ok w/ Motrin, Advil, naproxen  . Latex Hives    The neurologically relevant items on the patient's problem list were reviewed on today's visit.  Neurologic Examination  A problem focused neurological exam (12 or more points of the single system neurologic examination, vital signs counts as 1 point, cranial nerves count for 8 points) was performed.  Blood pressure 127/79, pulse 60, height 4' 11.5" (1.511 m), weight 173 lb 9.6 oz (78.7 kg).  General - Well nourished, well developed, pleasant middle-aged African-American female, in no apparent distress.  Ophthalmologic - Fundi not visualized due to eye movement.  Cardiovascular - Regular rate and rhythm.  Mental Status -  Level of arousal and orientation to time, place, and person were intact. Language including expression, naming, repetition, comprehension was assessed and found intact. Fund of Knowledge was assessed and was intact.  Cranial Nerves II - XII - II - Visual field intact OU. III, IV, VI - Extraocular movements intact. V - Facial sensation intact bilaterally. VII -mild left facial droop. VIII - Hearing & vestibular intact bilaterally. X - Palate elevates symmetrically. XI - Chin turning & shoulder shrug intact bilaterally. XII - Tongue protrusion intact.  Motor Strength - The patient's strength was normal in RUE and RLE, but 0/5 deltoid, 0/5 bicep, 0/5 tricep, 0/5 hand grip, 4/5 proximal LLE and 3/5 distally. Bulk was normal and fasciculations were absent.   Motor Tone - Muscle tone was assessed at the neck and appendages and was increased on the left UE and LE.   Reflexes - The patient's reflexes were 1+ in all extremities and Abigail Campos had no pathological reflexes.  Sensory - Light touch, temperature/pinprick were assessed and were decreased on the left, 50% comparing with right    Coordination -  The patient had normal movements in the right hand with no ataxia or dysmetria.  Tremor was absent.  Gait and Station - using four-point cane with left hemiparetic gait   Data reviewed: I personally reviewed the images and agree with the radiology interpretations.  Ct Head Wo Contrast 04/04/2016 IMPRESSION: 1. Stable right lentiform nucleus hemorrhage with slightly increased localized vasogenic edema. Localized mass effect with 4 mm right-to-left shift not significantly changed. 2. Decreasing small volume acute subarachnoid hemorrhage. 04/02/2016 IMPRESSION: Stable RIGHT putaminal intracerebral hemorrhage, favored to represent a hypertensive related event.  04/01/2016 IMPRESSION: Intraparenchymal hemorrhage arising from the posterior, lateral right basal ganglia with subarachnoid hemorrhage extending into the sylvian fissure on the right. There is mass effect with mild midline shift toward the left of approximately 7 mm. Elsewhere gray-white compartments normal. Mild paranasal sinus disease in the maxillary antra bilaterally. Volume of hemorrhage in the right basal ganglia region is approximately 26 cubic cm. Note that there is additional hemorrhage in the subarachnoid space on the right which cannot be easily quantified with respect to specific volume of hemorrhage.   Ct Angio Head and neck W/cm &/or Wo Cm 04/02/2016 IMPRESSION: 1.  Negative arterial findings on CTA head and neck, aside from mild calcified atherosclerosis of the left ICA siphon. 2. Stable hyperdense intra-axial hemorrhage in the right hemisphere since yesterday. Negative CTA Spot Sign, and no evidence of associated vascular malformation. Estimated blood volume of 21 mL. Mildly increased surrounding edema, but stable mild regional mass effect. 3. Poor dentition.   2D echocardiogram  Normal LV size and systolic function, EF 77-41%. Mild LV hypertrophy. Moderate diastolic dysfunction. Normal RV size and systolic function. No  significant valvular abnormalities.  Component     Latest Ref Rng 04/04/2016 05/18/2016  Cholesterol     0 - 200 mg/dL 169 102  Triglycerides     0.0 - 149.0 mg/dL 94 98.0  HDL Cholesterol     >39.00 mg/dL 37 (L) 30.60 (L)  VLDL     0.0 - 40.0 mg/dL 19 19.6  LDL (calc)     0 - 99 mg/dL 113 (H) 52  Total CHOL/HDL Ratio      4.6 3  NonHDL       71.88  Hemoglobin A1C     4.8 - 5.6 % 6.0 (H)   Mean Plasma Glucose      126   TSH     0.350 - 4.500 uIU/mL 0.744   Vitamin B12     180 - 914 pg/mL 280   RPR     Non Reactive Non Reactive   HIV     Non Reactive Non Reactive     Assessment: As you may recall, Abigail Campos is a 47 y.o. African American female with PMH of HA and HTN was admitted on 04/08/16 for severe HA and L sided weakness. CT showed a R BG hemorrhage. CTA head and neck showed no AVM or aneurysm. TTE EF 60-65%. LDL 113 and A1C 6.0. Currently has home PT/OT/speech twice a week, however, pt still has significant left hemiplegia. BP in good control at home with 3 BP meds. ASA allergy, on plavix for stroke prevention. With the resolution of hematoma, repeat CTA head ruled out AVM. Finished therapy in 01/2017 and lost follow up with PMR and neurology for more than a year. Discussed with her regarding further therapy options but only with very limited improvement potential.  Patient returns today for six-month follow-up with continued left hemiparesis.  Plan:  - continue lipitor and plavix for stroke prevention - will refer back to Dr. Posey Pronto for rehab follow up and Botox injections -Schedule appointment for continued OT - Follow up with your primary care physician for stroke risk factor modification. Recommend maintain blood pressure goal <130/80, diabetes with hemoglobin A1c goal below 7.0% and lipids with LDL cholesterol goal below 70 mg/dL.  - check BP at home and record  Follow-up as needed or call with questions/concerns  I spent more than 25 minutes of face to face time with the  patient. Greater than 50% of time was spent in counseling and coordination of care. We discussed about follow up with PMR Dr. Posey Pronto, and refer to PT/OT.  Venancio Poisson, AGNP-BC  [facility] [address] [place], [place] [address]  Phone [phone] Fax [phone] Note: This document was prepared with digital dictation and possible smart phrase technology. Any transcriptional errors that result from this process are unintentional.

## 2018-07-05 NOTE — Progress Notes (Signed)
I agree with the above plan 

## 2018-07-11 ENCOUNTER — Other Ambulatory Visit: Payer: Self-pay

## 2018-07-11 ENCOUNTER — Ambulatory Visit: Payer: 59 | Attending: Physical Medicine & Rehabilitation | Admitting: Occupational Therapy

## 2018-07-11 DIAGNOSIS — M25612 Stiffness of left shoulder, not elsewhere classified: Secondary | ICD-10-CM | POA: Insufficient documentation

## 2018-07-11 DIAGNOSIS — M25622 Stiffness of left elbow, not elsewhere classified: Secondary | ICD-10-CM | POA: Diagnosis present

## 2018-07-11 DIAGNOSIS — M25642 Stiffness of left hand, not elsewhere classified: Secondary | ICD-10-CM | POA: Diagnosis present

## 2018-07-11 DIAGNOSIS — R2681 Unsteadiness on feet: Secondary | ICD-10-CM | POA: Diagnosis present

## 2018-07-11 DIAGNOSIS — M25632 Stiffness of left wrist, not elsewhere classified: Secondary | ICD-10-CM | POA: Diagnosis present

## 2018-07-11 DIAGNOSIS — I69352 Hemiplegia and hemiparesis following cerebral infarction affecting left dominant side: Secondary | ICD-10-CM | POA: Diagnosis not present

## 2018-07-11 DIAGNOSIS — R208 Other disturbances of skin sensation: Secondary | ICD-10-CM | POA: Insufficient documentation

## 2018-07-11 NOTE — Therapy (Signed)
[facility], [place], [address] Phone: [phone]   Fax:  [phone]  Occupational Therapy Treatment  Patient Details  Name: Abigail Campos MRN: [id number] Date of Birth: [birth year] Referring Provider: Dr Delice Lesch   Encounter Date: 07/11/2018  OT End of Session - 07/11/18 1622    Visit Number  1    Number of Visits  9    Authorization Type  240 visit limit /  0 used    OT Start Time  2353    OT Stop Time  1528    OT Time Calculation (min)  41 min    Behavior During Therapy  [facility] for tasks assessed/performed       Past Medical History:  Diagnosis Date  . Essential hypertension 10/19/2007   03-2010: metoprolol changed to bystolic (was not feeling well on it, no specific allergy or reaction)    . Hyperlipemia   . Hypertension   . Stroke [facility])     Past Surgical History:  Procedure Laterality Date  . ABDOMINAL HYSTERECTOMY  11-14-07   no oophorectomy per surgical report  . CESAREAN SECTION     [id number]  . INGUINAL HERNIA REPAIR     2004    There were no vitals filed for this visit.  Subjective Assessment - 07/11/18 1455    Subjective   I need my left arm back- I want to hold my new niece - she's three months old.      Currently in Pain?  No/denies    Multiple Pain Sites  No         [facility] OT Assessment - 07/11/18 0001      Assessment   Medical Diagnosis  Chronic stroke with spastic left hemiplegia    Referring Provider  Dr Delice Lesch    Onset Date/Surgical Date  06/15/18 botox injection LUE    Hand Dominance  Left    Next MD Visit  --    Prior Therapy  2018      Precautions   Precautions  Fall    Precaution Comments  Has had several falls since discharge 2018      Balance Screen   Has the patient fallen in the past 6 months  Yes    How many times?  2      Prior Function   Level of Independence  Independent with basic ADLs    Vocation  On disability    Vocation  Requirements  Worked for medical records dept for [employer]       ADL   Eating/Feeding  Minimal assistance    Grooming  Minimal assistance    Upper Body Bathing  Modified independent    Lower Body Bathing  Modified independent    Upper Body Dressing  Minimal assistance    Lower Body Dressing  Modified independent    Toilet Transfer  Modified independent    Toileting - Clothing Manipulation  Modified independent    Toileting -  Hygiene  Modified Independent    Tub/Shower Transfer  Modified independent    Naval architect tub bench;Grab bars      IADL   Prior Level of Veterinary surgeon for transportation    Prior Level of Function Light Housekeeping  Independent    Light Housekeeping  Performs light daily tasks such as dishwashing, bed making    Prior Level of Function Meal Prep  independent    Meal Prep  Able to complete simple warm meal prep    Prior Level of Function Medication Managment  independent    Medication Management  -- husband oversees     Prior Level of Function Financial Management  independent    Financial Management  Requires assistance      Written Expression   Dominant Hand  Left      Cognition   Overall Cognitive Status  History of cognitive impairments - at baseline      Observation/Other Assessments   Focus on Therapeutic Outcomes (FOTO)   NA Chronic Stroke      Posture/Postural Control   Posture/Postural Control  Postural limitations    Postural Limitations  Posterior pelvic tilt;Weight shift right      Sensation   Light Touch  Impaired by gross assessment      Coordination   Gross Motor Movements are Fluid and Coordinated  No    Fine Motor Movements are Fluid and Coordinated  No    9 Hole Peg Test  Left NT- Unable    Box and Blocks  NT Unable      Tone   Assessment Location  Left Upper Extremity      ROM / Strength   AROM / PROM / Strength  PROM;AROM      AROM   Overall  AROM   Deficits    Overall AROM Comments  All attempts to move left arm begin with shoulder elevation, humeral adduction, internal rotation, and weight shift toward right side.        PROM   Overall PROM   Deficits    Overall PROM Comments  significant limitation shoulder, spasticity limits hand / wrist self range of motion    PROM Assessment Site  Shoulder    Right/Left Shoulder  Left    Left Shoulder Flexion  120 Degrees      Hand Function   Left Hand Gross Grasp  Impaired    Left Hand Grip (lbs)  -- unable    Left Hand Lateral Pinch  -- unable      LUE Tone   LUE Tone  Moderate                       OT Education - 07/11/18 1621    Education Details  Results of OT eval and plan for treatment - stretching program, splinting, and reinforce Left UE assist to right UE     Person(s) Educated  Patient    Methods  Explanation    Comprehension  Need further instruction       OT Short Term Goals - 07/11/18 1641      OT SHORT TERM GOAL #1   Title  XXX      OT SHORT TERM GOAL #2   Title  XXX      OT SHORT TERM GOAL #3   Title  XXX      OT SHORT TERM GOAL #4   Title  XXX      OT SHORT TERM GOAL #5   Title  XXX        OT Long Term Goals - 07/11/18 1634      OT LONG TERM GOAL #1   Title  Patient will perform a home stretching program for left upper extremity DUE 08/15/18    Time  4    Period  Weeks    Status  New    Target Date  08/15/18      OT LONG TERM GOAL #2   Title  Patient will demonstrate knowledge of positioning means to reduce tension in left UE.      Time  4    Period  Weeks    Status  New      OT LONG TERM GOAL #3   Title  Patient will doff/don left splint with no more than verbal cueing     Time  4    Period  Weeks    Status  New      OT LONG TERM GOAL #4   Title  Patient will incorporate left upper extremity as gross assist to right upper extremity 25%/time with ADL    Time  4    Period  Weeks    Status  New      OT LONG TERM  GOAL #5   Title  Patient will demonstrate understanding of movements that produce triggering in RIGHT thumb     Time  4    Period  Weeks    Status  New            Plan - 07/11/18 1624    Clinical Impression Statement  Patient is a 47 year old woman, well known to this clinician from prior Outpatient rehab in 2018 for stroke.  Patient now referred for OT following Botox injection to left upper extremity.  Patient is functioning well at home with her family, and needs only minimal assistance for ADL/IADL tasks.  However, she has lost function in her left arm as her spasticity has increased, therefore she is completing most tasks using only her non-dominant right UE.  Patient will benefit from short term course of OP OT to help regain passive motion in left upper extremity, and reaquaint her with options to have left hand assist right hand for daily life skills.  Patient could benefit from a positioning program, ans well as a home exercise program to help her manage her left arm.      Occupational Profile and client history currently impacting functional performance  Wife, mother, daughter, homemaker - initial stroke occured in 2018    Occupational performance deficits (Please refer to evaluation for details):  ADL's;IADL's;Rest and Sleep    Rehab Potential  Fair    OT Frequency  2x / week    OT Duration  4 weeks    OT Treatment/Interventions  Self-care/ADL training;Electrical Stimulation;Therapeutic exercise;Visual/perceptual remediation/compensation;Patient/family education;Splinting;Neuromuscular education;Moist Heat;Aquatic Therapy;Fluidtherapy;Therapist, nutritional;Therapeutic activities;Balance training;Cognitive remediation/compensation;Manual Therapy;DME and/or AE instruction;Ultrasound    Plan  assess splint issued by MD- Patient feels it is ill fitting.  (May need to fabricate custom splint) Teach stretching program for shoulder, elbow, wrist and digits,     Clinical Decision  Making  Limited treatment options, no task modification necessary    Recommended Other Services  PT ordered in March 2019 - may need to re-refer - patient with several significant falls since discharge 01/2017    Consulted and Agree with Plan of Care  Patient       Patient will benefit from skilled therapeutic intervention in order to improve the following deficits and impairments:  Decreased cognition, Decreased knowledge of use of DME, Impaired flexibility, Impaired vision/preception, Pain, Improper body mechanics, Impaired sensation, Decreased mobility, Decreased coordination, Decreased activity tolerance, Decreased range of motion, Decreased strength, Increased muscle spasms, Impaired tone, Impaired UE functional use, Impaired perceived functional ability, Decreased balance, Decreased safety awareness  Visit Diagnosis: Spastic hemiplegia of left dominant side  as late effect of cerebral infarction ([place]) - Plan: Ot plan of care cert/re-cert  Stiffness of left shoulder, not elsewhere classified - Plan: Ot plan of care cert/re-cert  Stiffness of left elbow, not elsewhere classified - Plan: Ot plan of care cert/re-cert  Stiffness of left wrist, not elsewhere classified - Plan: Ot plan of care cert/re-cert  Stiffness of left hand, not elsewhere classified - Plan: Ot plan of care cert/re-cert  Other disturbances of skin sensation - Plan: Ot plan of care cert/re-cert  Unsteadiness on feet - Plan: Ot plan of care cert/re-cert    Problem List Patient Active Problem List   Diagnosis Date Noted  . Spastic hemiparesis affecting dominant side ([place]) 01/04/2018  . Hyperglycemia 12/29/2016  . Depression 12/29/2016  . Muscle spasticity   . Obesity (BMI 30.0-34.9) 04/08/2016  . HLD (hyperlipidemia) 04/08/2016  . Basal ganglia hemorrhage ([place]) 04/08/2016  . Dysphagia as late effect of cerebrovascular disease   . Migraine with aura and without status migrainosus, not intractable   . Gait  disturbance, post-stroke   . Hemiplegia, post-stroke ([place])   . Aphasia, post-stroke   . Dysarthria, post-stroke   . ICH (intracerebral hemorrhage) ([facility]) - R basal ganglia due to hypertensive emergency 04/01/2016  . Upper airway cough syndrome 12/06/2015  . PCP NOTES >>>>>>>>>>>>>>>>>>>>>>>>>>>.. 09/25/2015  . Insomnia 12/28/2012  . Annual physical exam 11/23/2011  . Essential hypertension 10/19/2007    Mariah Milling, OTR/L 07/11/2018, 4:47 PM  [facility] [address] [place], [place], [address] Phone: [phone]   Fax:  [phone]  Name: Abigail Campos MRN: [id number] Date of Birth: 29-Nov-1971

## 2018-07-18 ENCOUNTER — Ambulatory Visit (INDEPENDENT_AMBULATORY_CARE_PROVIDER_SITE_OTHER): Payer: 59 | Admitting: Internal Medicine

## 2018-07-18 ENCOUNTER — Encounter: Payer: Self-pay | Admitting: Internal Medicine

## 2018-07-18 VITALS — BP 126/84 | HR 66 | Temp 98.3°F | Resp 16 | Ht 60.0 in | Wt 172.0 lb

## 2018-07-18 DIAGNOSIS — F32A Depression, unspecified: Secondary | ICD-10-CM

## 2018-07-18 DIAGNOSIS — G4700 Insomnia, unspecified: Secondary | ICD-10-CM

## 2018-07-18 DIAGNOSIS — F329 Major depressive disorder, single episode, unspecified: Secondary | ICD-10-CM | POA: Diagnosis not present

## 2018-07-18 DIAGNOSIS — F419 Anxiety disorder, unspecified: Secondary | ICD-10-CM | POA: Diagnosis not present

## 2018-07-18 DIAGNOSIS — R739 Hyperglycemia, unspecified: Secondary | ICD-10-CM | POA: Diagnosis not present

## 2018-07-18 MED ORDER — ESCITALOPRAM OXALATE 20 MG PO TABS: 20.0000 mg | ORAL_TABLET | Freq: Every day | ORAL | 2 refills | Status: DC

## 2018-07-18 MED ORDER — BUPROPION HCL 100 MG PO TABS: 100.0000 mg | ORAL_TABLET | Freq: Two times a day (BID) | ORAL | 2 refills | Status: DC

## 2018-07-18 NOTE — Patient Instructions (Signed)
Next visit in 2 months, please make an appointment  Increase your dose of Lexapro to 20 mg.  I sent a prescription for Lexapro to 20 mg: Take 1 tablet a day, we are sending a new prescription  Start Wellbutrin 100 mg: The first week, take 1 tablet daily, then take 1 tablet twice a day  Watch for side effects

## 2018-07-18 NOTE — Progress Notes (Signed)
Subjective:    Patient ID: Abigail Campos, female    DOB: Jan 19, 1971, 47 y.o.   MRN: [id number]  DOS:  07/18/2018 Type of visit - description : rov Interval history: Follow-up from previous visit. Reports that she is trying to lose weight, eating healthier, taking protein shakes and saw a Physiological scientist. He is trying to increase your exercise. Anxiety depression, still a issue, she "worries about everything".   Review of Systems Denies suicidal ideas.   Past Medical History:  Diagnosis Date  . Essential hypertension 10/19/2007   03-2010: metoprolol changed to bystolic (was not feeling well on it, no specific allergy or reaction)    . Hyperlipemia   . Hypertension   . Stroke [facility])     Past Surgical History:  Procedure Laterality Date  . ABDOMINAL HYSTERECTOMY  11-14-07   no oophorectomy per surgical report  . CESAREAN SECTION     [id number]  . INGUINAL HERNIA REPAIR     2004    Social History   Socioeconomic History  . Marital status: Married    Spouse name: Not on file  . Number of children: 2  . Years of education: Not on file  . Highest education level: Not on file  Occupational History  . Occupation: disable d/t Architectural technologist: [employer]  . Financial resource strain: Not on file  . Food insecurity:    Worry: Not on file    Inability: Not on file  . Transportation needs:    Medical: Not on file    Non-medical: Not on file  Tobacco Use  . Smoking status: Never Smoker  . Smokeless tobacco: Never Used  Substance and Sexual Activity  . Alcohol use: No  . Drug use: No  . Sexual activity: Not on file  Lifestyle  . Physical activity:    Days per week: Not on file    Minutes per session: Not on file  . Stress: Not on file  Relationships  . Social connections:    Talks on phone: Not on file    Gets together: Not on file    Attends religious service: Not on file    Active member of club or organization: Not on file   Attends meetings of clubs or organizations: Not on file    Relationship status: Not on file  . Intimate partner violence:    Fear of current or ex partner: Not on file    Emotionally abused: Not on file    Physically abused: Not on file    Forced sexual activity: Not on file  Other Topics Concern  . Not on file  Social History Narrative   Used to live independently, stroke 03-2016, d/c from rehab to his mother's house 04-2016, then moved back w/ her husband as off 04/2018   Daughter finished [employer] [employer], one son, both @ home         Allergies as of 07/18/2018      Reactions   Bee Venom Anaphylaxis   Peanut-containing Drug Products Anaphylaxis   Hydrocodone Hives   Generalize itching w/o rash   Isovue [iopamidol] Hives, Itching   Pt broke out with one facial hive.  Had itching on her right upper ant and posterior arm w/o evidence of hives.  Pt given water and 25mg  benadryl po.  We observed pt for 30 minutes before d/c.  J. Bohm     Ambien [zolpidem Tartrate] Other (See Comments)   forgetfulness  Aspirin Swelling   Reports blisters w/ ASA but pt reports is ok w/ Motrin, Advil, naproxen   Latex Hives      Medication List        Accurate as of 07/18/18  7:56 PM. Always use your most recent med list.          atorvastatin 20 MG tablet Commonly known as:  LIPITOR Take 1 tablet (20 mg total) by mouth daily at 6 PM.   baclofen 10 MG tablet Commonly known as:  LIORESAL Take 1 tablet (10 mg total) by mouth 3 (three) times daily.   buPROPion 100 MG tablet Commonly known as:  WELLBUTRIN Take 1 tablet (100 mg total) by mouth 2 (two) times daily. For the first week take only 1 tablet daily   clopidogrel 75 MG tablet Commonly known as:  PLAVIX Take by mouth.   escitalopram 20 MG tablet Commonly known as:  LEXAPRO Take 1 tablet (20 mg total) by mouth daily.   furosemide 20 MG tablet Commonly known as:  LASIX Take 1 tablet (20 mg total) by mouth daily.   lisinopril 2.5 MG  tablet Commonly known as:  PRINIVIL,ZESTRIL Take 1 tablet (2.5 mg total) by mouth daily.   nebivolol 5 MG tablet Commonly known as:  BYSTOLIC Take 1 tablet (5 mg total) by mouth every 12 (twelve) hours.   traZODone 50 MG tablet Commonly known as:  DESYREL Take 1 tablet (50 mg total) by mouth at bedtime.          Objective:   Physical Exam BP 126/84 (BP Location: Right Arm, Patient Position: Sitting, Cuff Size: Small)   Pulse 66   Temp 98.3 F (36.8 C) (Oral)   Resp 16   Ht 5' (1.524 m)   Wt 172 lb (78 kg)   SpO2 96%   BMI 33.59 kg/m   General:   Well developed, well nourished . NAD.  Neck: No  thyromegaly  Skin: Exposed areas without rash. Not pale. Not jaundice Neurologic:  alert & oriented X3.  Speech normal, gait and motor exam at baseline, history of a stroke with left paresis. Psych: Cognition and judgment appear intact.  Cooperative with normal attention span and concentration.  Behavior appropriate. Slightly anxious but no depressed appearing.  Overall looks slightly better than last visit    Assessment & Plan:   Assessment   Prediabetes: A1c 6.0 (April 2017)  HTN Hyperlipidemia Depression, insomnia GERD Stroke 03-2016:  ICH, right basilar ganglia due to HTN emergency, + encephalopathy, + dysphagia, aphasia, dysarthria Residual L spasticity.  CTA neck 03-2016 neg CTA head 10-17 (-) aneurysm Headaches , migraines dx after 2nd pregnancy, (-) CT head 2014 and 2015 Insomnia   Cough, persisting, saw Dr. Melvyn Novas 11-2015  PLAN: Prediabetes: Since the last visit she is determined to do better, has changed her diet and is trying to be more active. Depression, anxiety, insomnia: She looks better on today's exam however she continued to be constantly worry about her children and other issues.  She is doing counseling, currently on Lexapro 15 mg daily.  Would like to increase her medication.  Options discussed, we agreed on: Lexapro 20 mg daily, add Wellbutrin 100  mg twice a day.  Continue counseling.   RTC 2 months.

## 2018-07-18 NOTE — Progress Notes (Signed)
Pre visit review using our clinic review tool, if applicable. No additional management support is needed unless otherwise documented below in the visit note. 

## 2018-07-18 NOTE — Assessment & Plan Note (Signed)
Prediabetes: Since the last visit she is determined to do better, has changed her diet and is trying to be more active. Depression, anxiety, insomnia: She looks better on today's exam however she continued to be constantly worry about her children and other issues.  She is doing counseling, currently on Lexapro 15 mg daily.  Would like to increase her medication.  Options discussed, we agreed on: Lexapro 20 mg daily, add Wellbutrin 100 mg twice a day.  Continue counseling.   RTC 2 months.

## 2018-07-22 ENCOUNTER — Other Ambulatory Visit: Payer: Self-pay | Admitting: Internal Medicine

## 2018-07-22 DIAGNOSIS — I610 Nontraumatic intracerebral hemorrhage in hemisphere, subcortical: Secondary | ICD-10-CM

## 2018-07-25 ENCOUNTER — Ambulatory Visit: Payer: 59 | Attending: Physical Medicine & Rehabilitation | Admitting: Occupational Therapy

## 2018-07-25 DIAGNOSIS — M25642 Stiffness of left hand, not elsewhere classified: Secondary | ICD-10-CM | POA: Insufficient documentation

## 2018-07-25 DIAGNOSIS — M25612 Stiffness of left shoulder, not elsewhere classified: Secondary | ICD-10-CM | POA: Insufficient documentation

## 2018-07-25 DIAGNOSIS — M25632 Stiffness of left wrist, not elsewhere classified: Secondary | ICD-10-CM | POA: Insufficient documentation

## 2018-07-25 DIAGNOSIS — I69352 Hemiplegia and hemiparesis following cerebral infarction affecting left dominant side: Secondary | ICD-10-CM | POA: Insufficient documentation

## 2018-07-25 DIAGNOSIS — R278 Other lack of coordination: Secondary | ICD-10-CM | POA: Insufficient documentation

## 2018-07-25 DIAGNOSIS — M25622 Stiffness of left elbow, not elsewhere classified: Secondary | ICD-10-CM | POA: Insufficient documentation

## 2018-07-25 DIAGNOSIS — R208 Other disturbances of skin sensation: Secondary | ICD-10-CM | POA: Insufficient documentation

## 2018-07-27 ENCOUNTER — Ambulatory Visit: Payer: 59 | Admitting: Occupational Therapy

## 2018-07-27 DIAGNOSIS — M25622 Stiffness of left elbow, not elsewhere classified: Secondary | ICD-10-CM | POA: Diagnosis present

## 2018-07-27 DIAGNOSIS — R278 Other lack of coordination: Secondary | ICD-10-CM | POA: Diagnosis present

## 2018-07-27 DIAGNOSIS — R208 Other disturbances of skin sensation: Secondary | ICD-10-CM | POA: Diagnosis present

## 2018-07-27 DIAGNOSIS — M25632 Stiffness of left wrist, not elsewhere classified: Secondary | ICD-10-CM

## 2018-07-27 DIAGNOSIS — I69352 Hemiplegia and hemiparesis following cerebral infarction affecting left dominant side: Secondary | ICD-10-CM

## 2018-07-27 DIAGNOSIS — M25612 Stiffness of left shoulder, not elsewhere classified: Secondary | ICD-10-CM | POA: Diagnosis present

## 2018-07-27 DIAGNOSIS — M25642 Stiffness of left hand, not elsewhere classified: Secondary | ICD-10-CM | POA: Diagnosis present

## 2018-07-27 NOTE — Therapy (Signed)
[facility] [address] [place], [place], [address] Phone: [phone]   Fax:  [phone]  Occupational Therapy Treatment  Patient Details  Name: Abigail Campos MRN: [id number] Date of Birth: February 14, 1971 Referring Provider: Dr Delice Lesch   Encounter Date: 07/27/2018  OT End of Session - 07/27/18 1626    Visit Number  2    Number of Visits  9    Authorization Type  240 visit limit /  0 used    Authorization - Visit Number  1    Authorization - Number of Visits  240    OT Start Time  3329    OT Stop Time  1451    OT Time Calculation (min)  43 min    Behavior During Therapy  [facility] for tasks assessed/performed       Past Medical History:  Diagnosis Date  . Essential hypertension 10/19/2007   03-2010: metoprolol changed to bystolic (was not feeling well on it, no specific allergy or reaction)    . Hyperlipemia   . Hypertension   . Stroke [facility])     Past Surgical History:  Procedure Laterality Date  . ABDOMINAL HYSTERECTOMY  11-14-07   no oophorectomy per surgical report  . CESAREAN SECTION     [id number]  . INGUINAL HERNIA REPAIR     2004    There were no vitals filed for this visit.  Subjective Assessment - 07/27/18 1625    Subjective   Denies pain    Currently in Pain?  No/denies               Treatment: Pt brought in prefab splint issued at MD office. Pt donned splint. Pt's custom splint does not address finger extension for improved positioning. Pt can benefit from a custom splint to address finger extension to minimize risk for contracture. Therapist initiated custom splint fabrication of a resting hand splint for improved positioning. Due to pt's significant spasticity, increased time was required for fitting. Anticipate splint will be completed and issued next visit.              OT Short Term Goals - 07/11/18 1641      OT SHORT TERM GOAL #1   Title  XXX      OT SHORT TERM  GOAL #2   Title  XXX      OT SHORT TERM GOAL #3   Title  XXX      OT SHORT TERM GOAL #4   Title  XXX      OT SHORT TERM GOAL #5   Title  XXX        OT Long Term Goals - 07/11/18 1634      OT LONG TERM GOAL #1   Title  Patient will perform a home stretching program for left upper extremity DUE 08/15/18    Time  4    Period  Weeks    Status  New    Target Date  08/15/18      OT LONG TERM GOAL #2   Title  Patient will demonstrate knowledge of positioning means to reduce tension in left UE.      Time  4    Period  Weeks    Status  New      OT LONG TERM GOAL #3   Title  Patient will doff/don left splint with no more than verbal cueing     Time  4    Period  Weeks  Status  New      OT LONG TERM GOAL #4   Title  Patient will incorporate left upper extremity as gross assist to right upper extremity 25%/time with ADL    Time  4    Period  Weeks    Status  New      OT LONG TERM GOAL #5   Title  Patient will demonstrate understanding of movements that produce triggering in RIGHT thumb     Time  4    Period  Weeks    Status  New            Plan - 07/27/18 1626    Clinical Impression Statement  Pt arrived today with the custom prefab splint provided by MD office. Pt's prefab splint does not address postioning fingers for increased eaxtension. Pt can benefit from a custom splint for improved positioning.    Occupational Profile and client history currently impacting functional performance  Wife, mother, daughter, homemaker - initial stroke occured in 2018    Occupational performance deficits (Please refer to evaluation for details):  ADL's;IADL's;Rest and Sleep    Rehab Potential  Fair    OT Frequency  2x / week    OT Duration  4 weeks    OT Treatment/Interventions  Self-care/ADL training;Electrical Stimulation;Therapeutic exercise;Visual/perceptual remediation/compensation;Patient/family education;Splinting;Neuromuscular education;Moist Heat;Aquatic  Therapy;Fluidtherapy;Therapist, nutritional;Therapeutic activities;Balance training;Cognitive remediation/compensation;Manual Therapy;DME and/or AE instruction;Ultrasound    Plan  complete custom resting hand splint.    Consulted and Agree with Plan of Care  Patient       Patient will benefit from skilled therapeutic intervention in order to improve the following deficits and impairments:  Decreased cognition, Decreased knowledge of use of DME, Impaired flexibility, Impaired vision/preception, Pain, Improper body mechanics, Impaired sensation, Decreased mobility, Decreased coordination, Decreased activity tolerance, Decreased range of motion, Decreased strength, Increased muscle spasms, Impaired tone, Impaired UE functional use, Impaired perceived functional ability, Decreased balance, Decreased safety awareness  Visit Diagnosis: Spastic hemiplegia of left dominant side as late effect of cerebral infarction (HCC)  Stiffness of left shoulder, not elsewhere classified  Stiffness of left elbow, not elsewhere classified  Stiffness of left wrist, not elsewhere classified  Stiffness of left hand, not elsewhere classified  Other disturbances of skin sensation    Problem List Patient Active Problem List   Diagnosis Date Noted  . Spastic hemiparesis affecting dominant side ([place]) 01/04/2018  . Hyperglycemia 12/29/2016  . Anxiety and depression 12/29/2016  . Muscle spasticity   . Obesity (BMI 30.0-34.9) 04/08/2016  . HLD (hyperlipidemia) 04/08/2016  . Basal ganglia hemorrhage ([place]) 04/08/2016  . Dysphagia as late effect of cerebrovascular disease   . Migraine with aura and without status migrainosus, not intractable   . Gait disturbance, post-stroke   . Hemiplegia, post-stroke ([place])   . Aphasia, post-stroke   . Dysarthria, post-stroke   . ICH (intracerebral hemorrhage) (HCC) - R basal ganglia due to hypertensive emergency 04/01/2016  . Upper airway cough syndrome 12/06/2015  . PCP  NOTES >>>>>>>>>>>>>>>>>>>>>>>>>>>.. 09/25/2015  . Insomnia 12/28/2012  . Annual physical exam 11/23/2011  . Essential hypertension 10/19/2007    RINE,KATHRYN 07/27/2018, 4:28 PM  [facility] [address] [place], [place], [address] Phone: [phone]   Fax:  [phone]  Name: Abigail Campos MRN: [id number] Date of Birth: 1971/11/29

## 2018-08-01 ENCOUNTER — Ambulatory Visit: Payer: 59 | Admitting: Occupational Therapy

## 2018-08-01 ENCOUNTER — Encounter: Payer: Self-pay | Admitting: Occupational Therapy

## 2018-08-01 DIAGNOSIS — M25612 Stiffness of left shoulder, not elsewhere classified: Secondary | ICD-10-CM

## 2018-08-01 DIAGNOSIS — R208 Other disturbances of skin sensation: Secondary | ICD-10-CM

## 2018-08-01 DIAGNOSIS — M25642 Stiffness of left hand, not elsewhere classified: Secondary | ICD-10-CM

## 2018-08-01 DIAGNOSIS — I69352 Hemiplegia and hemiparesis following cerebral infarction affecting left dominant side: Secondary | ICD-10-CM

## 2018-08-01 DIAGNOSIS — M25622 Stiffness of left elbow, not elsewhere classified: Secondary | ICD-10-CM

## 2018-08-01 DIAGNOSIS — M25632 Stiffness of left wrist, not elsewhere classified: Secondary | ICD-10-CM

## 2018-08-01 NOTE — Patient Instructions (Signed)
Your Splint This splint should initially be fitted by a healthcare practitioner.  The healthcare practitioner is responsible for providing wearing instructions and precautions to the patient, other healthcare practitioners and care provider involved in the patient's care.  This splint was custom made for you. Please read the following instructions to learn about wearing and caring for your splint.  Precautions Should your splint cause any of the following problems, remove the splint immediately and contact your therapist/physician.  Swelling  Severe Pain  Pressure Areas  Stiffness  Numbness  Do not wear your splint while operating machinery unless it has been fabricated for that purpose.  When To Wear Your Splint Where your splint according to your therapist/physician instructions. Day One (Tuesday):  Wear your splint 2 hours in the morning and 2 hours in the late afternoon/early evening. If no problems: Day Two (Wednesday):  Wear your splint 2 hours in the morning and 2 hours in the late afternoon/early evening. If no problems: Day Three (Thursday): Wear 4 hours in the morning and 4 hours in the late afternoon/early evening. If no problems: Day Four (Friday): Wear 4 hours in the morning and 4 hours in the last afternoon/early evening. If no problems: Day Five (Saturday): DO NOT WEAR DURING THE DAY!! Now you can sleep with it on at night from this point on. You will no longer wear it during the day.    Care and Cleaning of Your Splint 1. Keep your splint away from open flames. 2. Your splint will lose its shape in temperatures over 135 degrees Farenheit, ( in car windows, near radiators, ovens or in hot water).  Never make any adjustments to your splint, if the splint needs adjusting remove it and make an appointment to see your therapist. 3. Your splint, including the cushion liner may be cleaned with soap and lukewarm water.  Do not immerse in hot water over 135 degrees  Farenheit. 4. Straps may be washed with soap and water, but do not moisten the self-adhesive portion. 5. For ink or hard to remove spots use a scouring cleanser which contains chlorine.  Rinse the splint thoroughly after using chlorine cleanser.

## 2018-08-01 NOTE — Therapy (Signed)
[facility] [address] [place], [place], [address] Phone: [phone]   Fax:  [phone]  Occupational Therapy Treatment  Patient Details  Name: Abigail Campos MRN: [id number] Date of Birth: 04-Apr-1971 Referring Provider: Dr Delice Lesch   Encounter Date: 08/01/2018  OT End of Session - 08/01/18 1544    Visit Number  3    Number of Visits  9    Authorization Type  240 visit limit /  0 used    Authorization - Visit Number  2    Authorization - Number of Visits  240    OT Start Time  5638    OT Stop Time  1529    OT Time Calculation (min)  42 min    Activity Tolerance  Patient tolerated treatment well       Past Medical History:  Diagnosis Date  . Essential hypertension 10/19/2007   03-2010: metoprolol changed to bystolic (was not feeling well on it, no specific allergy or reaction)    . Hyperlipemia   . Hypertension   . Stroke [facility])     Past Surgical History:  Procedure Laterality Date  . ABDOMINAL HYSTERECTOMY  11-14-07   no oophorectomy per surgical report  . CESAREAN SECTION     [id number]  . INGUINAL HERNIA REPAIR     2004    There were no vitals filed for this visit.  Subjective Assessment - 08/01/18 1454    Subjective   They won't take this other splint back that the dr gave me.    Currently in Pain?  No/denies                   OT Treatments/Exercises (OP) - 08/01/18 0001      Splinting   Splinting  Completed fabrication of custom resting hand splint for R hand.  Pt educated on donning/doffing (pt needs assistance to don however can talk caregiver through process), as well as initial wearing schediule to build up tolerance. Recommended slower build up due to poor sensation in R hand.  Pt verbalized understanding. Also edcuated in care of splint. All information provided in writing and pt issues extra straps as well as stockinette. Pt verbalized understanding of all info.               OT Education - 08/01/18 1541    Education Details  wear and care of custom resting hand splint, donning/doffing    Person(s) Educated  Patient;Spouse    Methods  Explanation;Demonstration;Handout    Comprehension  Verbalized understanding;Returned demonstration   husband to assist with donning and pt can talk  husband through process      OT Short Term Goals - 08/01/18 1542      [place] #1   Title  XXX      OT SHORT TERM GOAL #2   Title  XXX      OT SHORT TERM GOAL #3   Title  XXX      OT SHORT TERM GOAL #4   Title  XXX      OT SHORT TERM GOAL #5   Title  XXX        OT Long Term Goals - 08/01/18 1542      OT LONG TERM GOAL #1   Title  Patient will perform a home stretching program for left upper extremity DUE 08/15/18    Time  4    Period  Weeks    Status  New      OT LONG TERM GOAL #2   Title  Patient will demonstrate knowledge of positioning means to reduce tension in left UE.      Time  4    Period  Weeks    Status  New      OT LONG TERM GOAL #3   Title  Patient will doff/don left splint with no more than verbal cueing     Time  4    Period  Weeks    Status  Partially Met   pt unable don without assistance but can talk caregiver through process     OT LONG TERM GOAL #4   Title  Patient will incorporate left upper extremity as gross assist to right upper extremity 25%/time with ADL    Time  4    Period  Weeks    Status  New      OT LONG TERM GOAL #5   Title  Patient will demonstrate understanding of movements that produce triggering in RIGHT thumb     Time  4    Period  Weeks    Status  New            Plan - 08/01/18 1543    Clinical Impression Statement  Pt progressing toward goals. Pt states new custom splint is comfortable.     Occupational Profile and client history currently impacting functional performance  Wife, mother, daughter, homemaker - initial stroke occured in 2018    Occupational performance deficits  (Please refer to evaluation for details):  ADL's;IADL's;Rest and Sleep    Rehab Potential  Fair    OT Frequency  2x / week    OT Duration  4 weeks    OT Treatment/Interventions  Self-care/ADL training;Electrical Stimulation;Therapeutic exercise;Visual/perceptual remediation/compensation;Patient/family education;Splinting;Neuromuscular education;Moist Heat;Aquatic Therapy;Fluidtherapy;Therapist, nutritional;Therapeutic activities;Balance training;Cognitive remediation/compensation;Manual Therapy;DME and/or AE instruction;Ultrasound    Plan  check splint, initiate HEP if possible    Consulted and Agree with Plan of Care  Patient;Family member/caregiver    Family Member Consulted  husband in waiting room       Patient will benefit from skilled therapeutic intervention in order to improve the following deficits and impairments:  Decreased cognition, Decreased knowledge of use of DME, Impaired flexibility, Impaired vision/preception, Pain, Improper body mechanics, Impaired sensation, Decreased mobility, Decreased coordination, Decreased activity tolerance, Decreased range of motion, Decreased strength, Increased muscle spasms, Impaired tone, Impaired UE functional use, Impaired perceived functional ability, Decreased balance, Decreased safety awareness  Visit Diagnosis: Spastic hemiplegia of left dominant side as late effect of cerebral infarction (HCC)  Stiffness of left shoulder, not elsewhere classified  Stiffness of left elbow, not elsewhere classified  Stiffness of left wrist, not elsewhere classified  Stiffness of left hand, not elsewhere classified  Other disturbances of skin sensation    Problem List Patient Active Problem List   Diagnosis Date Noted  . Spastic hemiparesis affecting dominant side ([place]) 01/04/2018  . Hyperglycemia 12/29/2016  . Anxiety and depression 12/29/2016  . Muscle spasticity   . Obesity (BMI 30.0-34.9) 04/08/2016  . HLD (hyperlipidemia) 04/08/2016   . Basal ganglia hemorrhage ([place]) 04/08/2016  . Dysphagia as late effect of cerebrovascular disease   . Migraine with aura and without status migrainosus, not intractable   . Gait disturbance, post-stroke   . Hemiplegia, post-stroke ([place])   . Aphasia, post-stroke   . Dysarthria, post-stroke   . ICH (intracerebral hemorrhage) (HCC) - R basal ganglia due to hypertensive emergency 04/01/2016  . Upper  airway cough syndrome 12/06/2015  . PCP NOTES >>>>>>>>>>>>>>>>>>>>>>>>>>>.. 09/25/2015  . Insomnia 12/28/2012  . Annual physical exam 11/23/2011  . Essential hypertension 10/19/2007    Quay Burow, OTR/L 08/01/2018, 3:46 PM  [facility] [address] [place], [place], [address] Phone: [phone]   Fax:  [phone]  Name: Abigail Campos MRN: [id number] Date of Birth: 04/12/1971

## 2018-08-03 ENCOUNTER — Ambulatory Visit: Payer: 59 | Admitting: Occupational Therapy

## 2018-08-03 DIAGNOSIS — M25622 Stiffness of left elbow, not elsewhere classified: Secondary | ICD-10-CM

## 2018-08-03 DIAGNOSIS — I69352 Hemiplegia and hemiparesis following cerebral infarction affecting left dominant side: Secondary | ICD-10-CM

## 2018-08-03 DIAGNOSIS — M25642 Stiffness of left hand, not elsewhere classified: Secondary | ICD-10-CM

## 2018-08-03 DIAGNOSIS — R278 Other lack of coordination: Secondary | ICD-10-CM

## 2018-08-03 DIAGNOSIS — M25612 Stiffness of left shoulder, not elsewhere classified: Secondary | ICD-10-CM

## 2018-08-03 DIAGNOSIS — M25632 Stiffness of left wrist, not elsewhere classified: Secondary | ICD-10-CM

## 2018-08-03 DIAGNOSIS — R208 Other disturbances of skin sensation: Secondary | ICD-10-CM

## 2018-08-03 NOTE — Patient Instructions (Signed)
Wear thumb spica splint on right hand most of the time while awake and at Night,(except for bathing) remove if any problems, pressure areas or redness.

## 2018-08-03 NOTE — Therapy (Signed)
[facility] [address] [place], [place], [address] Phone: [phone]   Fax:  [phone]  Occupational Therapy Treatment  Patient Details  Name: Abigail Campos MRN: [id number] Date of Birth: 05-07-1971 Referring Provider: Dr Delice Lesch   Encounter Date: 08/03/2018  OT End of Session - 08/03/18 1301    Visit Number  4    Number of Visits  9    Authorization Type  240 visit limit /  0 used    Authorization - Visit Number  3    Authorization - Number of Visits  240    OT Start Time  1150    OT Stop Time  1232    OT Time Calculation (min)  42 min    Activity Tolerance  Patient tolerated treatment well    Behavior During Therapy  [facility] for tasks assessed/performed       Past Medical History:  Diagnosis Date  . Essential hypertension 10/19/2007   03-2010: metoprolol changed to bystolic (was not feeling well on it, no specific allergy or reaction)    . Hyperlipemia   . Hypertension   . Stroke [facility])     Past Surgical History:  Procedure Laterality Date  . ABDOMINAL HYSTERECTOMY  11-14-07   no oophorectomy per surgical report  . CESAREAN SECTION     [id number]  . INGUINAL HERNIA REPAIR     2004    There were no vitals filed for this visit.  Subjective Assessment - 08/03/18 1303    Currently in Pain?  No/denies           Treatment: minor adjustments to resting hand splint for improved fit and comfort, mild pink coloration at thenar eminence, so therapist adjusted and added padding Pt reports improved comfort. Pt was fitted with a prefab thumb spica splint to address RUE trigger thumb. Gentle stretches to LUE forearm in supination/ pronation and self stretch reach for floor for gentl shoulder flexion LUE.                OT Education - 08/03/18 1258    Education Details  resting hand splint wear, care and prec. following adjustments, thumb spica splint wear, care and precautions.    Person(s) Educated  Patient    Methods  Explanation;Demonstration    Comprehension  Verbalized understanding;Returned demonstration       OT Short Term Goals - 08/01/18 1542      OT SHORT TERM GOAL #1   Title  XXX      OT SHORT TERM GOAL #2   Title  XXX      OT SHORT TERM GOAL #3   Title  XXX      OT SHORT TERM GOAL #4   Title  XXX      OT SHORT TERM GOAL #5   Title  XXX        OT Long Term Goals - 08/01/18 1542      OT LONG TERM GOAL #1   Title  Patient will perform a home stretching program for left upper extremity DUE 08/15/18    Time  4    Period  Weeks    Status  New      OT LONG TERM GOAL #2   Title  Patient will demonstrate knowledge of positioning means to reduce tension in left UE.      Time  4    Period  Weeks    Status  New  OT LONG TERM GOAL #3   Title  Patient will doff/don left splint with no more than verbal cueing     Time  4    Period  Weeks    Status  Partially Met   pt unable don without assistance but can talk caregiver through process     OT LONG TERM GOAL #4   Title  Patient will incorporate left upper extremity as gross assist to right upper extremity 25%/time with ADL    Time  4    Period  Weeks    Status  New      OT LONG TERM GOAL #5   Title  Patient will demonstrate understanding of movements that produce triggering in RIGHT thumb     Time  4    Period  Weeks    Status  New            Plan - 08/03/18 1301    Clinical Impression Statement  pt is progressing towards goals. She verbalizes understanding of bilateral splint wear, care and precautions.    Occupational Profile and client history currently impacting functional performance  Wife, mother, daughter, homemaker - initial stroke occured in 2018    Occupational performance deficits (Please refer to evaluation for details):  ADL's;IADL's;Rest and Sleep    Rehab Potential  Fair    OT Frequency  2x / week    OT Duration  4 weeks    OT Treatment/Interventions   Self-care/ADL training;Electrical Stimulation;Therapeutic exercise;Visual/perceptual remediation/compensation;Patient/family education;Splinting;Neuromuscular education;Moist Heat;Aquatic Therapy;Fluidtherapy;Therapist, nutritional;Therapeutic activities;Balance training;Cognitive remediation/compensation;Manual Therapy;DME and/or AE instruction;Ultrasound    Plan  HEP as able, splint check and education regarding trigger thumb for RUE    Consulted and Agree with Plan of Care  Patient       Patient will benefit from skilled therapeutic intervention in order to improve the following deficits and impairments:  Decreased cognition, Decreased knowledge of use of DME, Impaired flexibility, Impaired vision/preception, Pain, Improper body mechanics, Impaired sensation, Decreased mobility, Decreased coordination, Decreased activity tolerance, Decreased range of motion, Decreased strength, Increased muscle spasms, Impaired tone, Impaired UE functional use, Impaired perceived functional ability, Decreased balance, Decreased safety awareness  Visit Diagnosis: Spastic hemiplegia of left dominant side as late effect of cerebral infarction (HCC)  Stiffness of left shoulder, not elsewhere classified  Stiffness of left elbow, not elsewhere classified  Stiffness of left wrist, not elsewhere classified  Stiffness of left hand, not elsewhere classified  Other disturbances of skin sensation  Other lack of coordination    Problem List Patient Active Problem List   Diagnosis Date Noted  . Spastic hemiparesis affecting dominant side ([place]) 01/04/2018  . Hyperglycemia 12/29/2016  . Anxiety and depression 12/29/2016  . Muscle spasticity   . Obesity (BMI 30.0-34.9) 04/08/2016  . HLD (hyperlipidemia) 04/08/2016  . Basal ganglia hemorrhage ([place]) 04/08/2016  . Dysphagia as late effect of cerebrovascular disease   . Migraine with aura and without status migrainosus, not intractable   . Gait disturbance,  post-stroke   . Hemiplegia, post-stroke ([place])   . Aphasia, post-stroke   . Dysarthria, post-stroke   . ICH (intracerebral hemorrhage) (HCC) - R basal ganglia due to hypertensive emergency 04/01/2016  . Upper airway cough syndrome 12/06/2015  . PCP NOTES >>>>>>>>>>>>>>>>>>>>>>>>>>>.. 09/25/2015  . Insomnia 12/28/2012  . Annual physical exam 11/23/2011  . Essential hypertension 10/19/2007    Orson Rho 08/03/2018, 1:10 PM  [facility] [address] [place], [place], [address] Phone: [phone]  Fax:  [phone]  Name: Abigail Campos MRN: [id number] Date of Birth: 1971-04-06

## 2018-08-05 ENCOUNTER — Encounter: Payer: Self-pay | Admitting: Physical Medicine & Rehabilitation

## 2018-08-05 ENCOUNTER — Encounter: Payer: 59 | Attending: Physical Medicine & Rehabilitation | Admitting: Physical Medicine & Rehabilitation

## 2018-08-05 ENCOUNTER — Other Ambulatory Visit: Payer: Self-pay

## 2018-08-05 VITALS — BP 116/80 | HR 73 | Ht 59.0 in | Wt 165.8 lb

## 2018-08-05 DIAGNOSIS — I10 Essential (primary) hypertension: Secondary | ICD-10-CM | POA: Diagnosis present

## 2018-08-05 DIAGNOSIS — I69154 Hemiplegia and hemiparesis following nontraumatic intracerebral hemorrhage affecting left non-dominant side: Secondary | ICD-10-CM

## 2018-08-05 DIAGNOSIS — I69254 Hemiplegia and hemiparesis following other nontraumatic intracranial hemorrhage affecting left non-dominant side: Secondary | ICD-10-CM | POA: Insufficient documentation

## 2018-08-05 DIAGNOSIS — G43909 Migraine, unspecified, not intractable, without status migrainosus: Secondary | ICD-10-CM | POA: Insufficient documentation

## 2018-08-05 DIAGNOSIS — R252 Cramp and spasm: Secondary | ICD-10-CM | POA: Insufficient documentation

## 2018-08-05 DIAGNOSIS — E785 Hyperlipidemia, unspecified: Secondary | ICD-10-CM | POA: Insufficient documentation

## 2018-08-05 DIAGNOSIS — R58 Hemorrhage, not elsewhere classified: Secondary | ICD-10-CM | POA: Insufficient documentation

## 2018-08-05 NOTE — Progress Notes (Signed)
Dysport: Procedure Note Patient Name: Abigail Campos DOB: 19-Jul-1971 MRN: [id number] Date: 08/05/18  Procedure: Botulinum toxin administration Guidance: EMG Diagnosis: Spastic left nondominant IPH Attending: Delice Lesch, MD   Informed consent: Risks, benefits & options of the procedure are explained to the patient (and/or family). The patient elects to proceed with procedure. Risks include but are not limited to weakness, respiratory distress, dry mouth, ptosis, antibody formation, worsening of some areas of function. Benefits include decreased abnormal muscle tone, improved hygiene and positioning, decreased skin breakdown and, in some cases, decreased pain. Options include conservative management with oral antispasticity agents, phenol chemodenervation of nerve or at motor nerve branches. More invasive options include intrathecal balcofen adminstration for appropriate candidates. Surgical options may include tendon lengthening or transposition or, rarely, dorsal rhizotomy.   History/Physical Examination: 47 y.o.  Year old right-handed female with history of hypertension, migraine headaches presents for follow up after right basal ganglia hemorrhage.   Modified [place]: Left 1/4 elbow flexors, 1+/4 wrist flexors, 2/4 left finger flexors, 3/4 ankle plantar flexors.  Previous Treatments: Therapy/Range of motion Indication for guidance: Target active muscules  Procedure: Botulinum toxin was mixed with preservative free saline with a dilution of 0.5cc to 100 units. Targeted limb and muscles were identified. The skin was prepped with alcohol swabs and placement of needle tip in targeted muscle was confirmed using appropriate guidance. Prior to injection, positioning of needle tip outside of blood vessel was determined by pulling back on syringe plunger.  MUSCLE UNITS  Left Med biceps: 200 units Left FCR: 200 Left FCU: 200 Left FDS: 200 Left FDP: 200  Left Med gastroc: 150 Left Lat  gastroc: 150   Total units used: 1694  Complications: None  Plan: Follow up in 6 weeks  Ezri Landers Anil Elaynah [place] 12:10 PM

## 2018-08-08 ENCOUNTER — Ambulatory Visit: Payer: 59 | Admitting: Occupational Therapy

## 2018-08-08 DIAGNOSIS — M25622 Stiffness of left elbow, not elsewhere classified: Secondary | ICD-10-CM

## 2018-08-08 DIAGNOSIS — R208 Other disturbances of skin sensation: Secondary | ICD-10-CM

## 2018-08-08 DIAGNOSIS — M25612 Stiffness of left shoulder, not elsewhere classified: Secondary | ICD-10-CM

## 2018-08-08 DIAGNOSIS — M25642 Stiffness of left hand, not elsewhere classified: Secondary | ICD-10-CM

## 2018-08-08 DIAGNOSIS — I69352 Hemiplegia and hemiparesis following cerebral infarction affecting left dominant side: Secondary | ICD-10-CM

## 2018-08-08 DIAGNOSIS — R278 Other lack of coordination: Secondary | ICD-10-CM

## 2018-08-08 DIAGNOSIS — M25632 Stiffness of left wrist, not elsewhere classified: Secondary | ICD-10-CM

## 2018-08-08 NOTE — Patient Instructions (Signed)
Local Driver Evaluation Programs: ° °Comprehensive Evaluation: includes clinical and in vehicle behind the wheel testing by OCCUPATIONAL THERAPIST. Programs have varying levels of adaptive controls available for trial.  ° °[facility] °[address] °[place], [place]  [address] °[phone] or [phone] °http://www.driver-rehab.com °Evaluator:  Cyndee Crompton, OT/CDRS/CDI/SCDCM/Low Vision Certification ° °[facility] °[address] °[place], [place] [address] °[phone] °https://www.[facility].org/home/services/rehabilitation.aspx °Evaluators:  Shannon Sheek, OT and Jill Tucker, OT ° °[facility] [place] [facility] (ONLY SERVES VETERANS!!) °[facility] °[address] °[place], [place]  [address] °[phone] [phone] °http://www.[place].va.gov/services/Physical_Medicine_Rehabilitation_Services.asp °Evaluators:  Eric Andrews, KT; Heidi Harris, KT;  Gary Whitaker, KT (KT=kiniesotherapist) ° ° °Clinical evaluations only:  Includes clinical testing, refers to other programs or local certified driving instructor for behind the wheel testing. ° °[facility] (outpatient Rehab) °[facility] °[address] °[place], [place] [address] °[phone] for scheduling °http://www.[facility].edu/Outpatient-Rehabilitation/Neurorehabilitation-Therapy.htm °Evaluators:  Kelly Lambeth, OT; Kate Phillips, OT ° °Other area clinical evaluators available upon request including [facility], [facility] and [facility]. ° ° °    Resource List °What is a Driver Evaluation: °Your Road Ahead - A Guide to Comprehensive Driving Evaluations °http://www.thehartford.com/resources/mature-market-excellence/publications-on-aging ° °Association for [employer] - Disability and Driving Fact Sheets °http://www.aded.net/?page=510 ° °Driving after a Brain  Injury: °[employer] °http://www.biausa.org/tbims-abstracts/if-there-is-an-effective-way-to-determine-if-someone-is-ready-to-drive-after-tbi?A=SearchResult&SearchID=9495675&ObjectID=2758842&ObjectType=35 ° °Driving with Adaptive Equipment: °[facility] °http://www.driver-rehab.com/adaptive-equipment ° °[employer] °http://www.nmeda.com/ ° ° ° ° ° ° °  °

## 2018-08-08 NOTE — Therapy (Signed)
[facility] [address] [place], [place], [address] Phone: [phone]   Fax:  [phone]  Occupational Therapy Treatment  Patient Details  Name: Abigail Campos MRN: [id number] Date of Birth: [birth year] Referring Provider: Dr Delice Lesch   Encounter Date: 08/08/2018  OT End of Session - 08/08/18 1426    Visit Number  5    Number of Visits  9    Authorization - Visit Number  4    Authorization - Number of Visits  240    OT Start Time  6629    OT Stop Time  1445    OT Time Calculation (min)  40 min    Activity Tolerance  Patient tolerated treatment well    Behavior During Therapy  [facility] for tasks assessed/performed       Past Medical History:  Diagnosis Date  . Essential hypertension 10/19/2007   03-2010: metoprolol changed to bystolic (was not feeling well on it, no specific allergy or reaction)    . Hyperlipemia   . Hypertension   . Stroke [facility])     Past Surgical History:  Procedure Laterality Date  . ABDOMINAL HYSTERECTOMY  11-14-07   no oophorectomy per surgical report  . CESAREAN SECTION     [id number]  . INGUINAL HERNIA REPAIR     2004    There were no vitals filed for this visit.  Subjective Assessment - 08/08/18 1657    Currently in Pain?  No/denies                Treatment; Pt arrived wearing bilateral splints. Minor adjustment to strapping on resting hand splint to prevent splint from sliding. Pt reports she is tolerating resting hand splint x 4 hrs at a time without issues. Pt reports wearing thumb spica splint most of the time and she has not had triggering. [place] to right thumb, thenar eminence 40mz, 0.8 w/cm 2, 20% x 8 mins to address trigger finger. No adverse reactions. Stretches to LUE, pt was instructed in self stretch reach for the floor, table slides for shoulder flexion and passive supination / pronation. Pt returned demonstration. Therapy to be placed on hold x 2 weeks while  await botox to take effect.           OT Education - 08/08/18 1657    Education Details  Information regarding driving evals as pt reports she is considering return to driving, inital HEP- stretches to reach for floor with bilateral UE's, table slide for shoulder flexion and P/ROM supination/ pronation, reasoning regarding placing therapy on hold while awaiting botox to take effect, avoiding repetative gripping with RUE to minimize triggering.    Person(s) Educated  Patient    Methods  Explanation;Demonstration    Comprehension  Verbalized understanding;Returned demonstration;Verbal cues required       OT Short Term Goals - 08/01/18 1542      OT SHORT TERM GOAL #1   Title  XXX      OT SHORT TERM GOAL #2   Title  XXX      OT SHORT TERM GOAL #3   Title  XXX      OT SHORT TERM GOAL #4   Title  XXX      OT SHORT TERM GOAL #5   Title  XXX        OT Long Term Goals - 08/01/18 1542      OT LONG TERM GOAL #1   Title  Patient will perform a  home stretching program for left upper extremity DUE 08/15/18    Time  4    Period  Weeks    Status  New      OT LONG TERM GOAL #2   Title  Patient will demonstrate knowledge of positioning means to reduce tension in left UE.      Time  4    Period  Weeks    Status  New      OT LONG TERM GOAL #3   Title  Patient will doff/don left splint with no more than verbal cueing     Time  4    Period  Weeks    Status  Partially Met   pt unable don without assistance but can talk caregiver through process     OT LONG TERM GOAL #4   Title  Patient will incorporate left upper extremity as gross assist to right upper extremity 25%/time with ADL    Time  4    Period  Weeks    Status  New      OT LONG TERM GOAL #5   Title  Patient will demonstrate understanding of movements that produce triggering in RIGHT thumb     Time  4    Period  Weeks    Status  New            Plan - 08/08/18 1701    Clinical Impression Statement  Pt is  progressing toward goals. Pt received botox injection last week. Pt to be placed on hold in order to allow time for botox to take effect.    Occupational performance deficits (Please refer to evaluation for details):  ADL's;IADL's;Rest and Sleep    Rehab Potential  Fair    OT Frequency  2x / week    OT Duration  4 weeks    OT Treatment/Interventions  Self-care/ADL training;Electrical Stimulation;Therapeutic exercise;Visual/perceptual remediation/compensation;Patient/family education;Splinting;Neuromuscular education;Moist Heat;Aquatic Therapy;Fluidtherapy;Therapist, nutritional;Therapeutic activities;Balance training;Cognitive remediation/compensation;Manual Therapy;DME and/or AE instruction;Ultrasound    Plan   splint check, progress HEP and work towards long term goals    Consulted and Agree with Plan of Care  Patient       Patient will benefit from skilled therapeutic intervention in order to improve the following deficits and impairments:  Decreased cognition, Decreased knowledge of use of DME, Impaired flexibility, Impaired vision/preception, Pain, Improper body mechanics, Impaired sensation, Decreased mobility, Decreased coordination, Decreased activity tolerance, Decreased range of motion, Decreased strength, Increased muscle spasms, Impaired tone, Impaired UE functional use, Impaired perceived functional ability, Decreased balance, Decreased safety awareness  Visit Diagnosis: Spastic hemiplegia of left dominant side as late effect of cerebral infarction (HCC)  Stiffness of left shoulder, not elsewhere classified  Stiffness of left elbow, not elsewhere classified  Stiffness of left wrist, not elsewhere classified  Stiffness of left hand, not elsewhere classified  Other disturbances of skin sensation  Other lack of coordination    Problem List Patient Active Problem List   Diagnosis Date Noted  . Spastic hemiparesis affecting dominant side ([place]) 01/04/2018  .  Hyperglycemia 12/29/2016  . Anxiety and depression 12/29/2016  . Muscle spasticity   . Obesity (BMI 30.0-34.9) 04/08/2016  . HLD (hyperlipidemia) 04/08/2016  . Basal ganglia hemorrhage ([place]) 04/08/2016  . Dysphagia as late effect of cerebrovascular disease   . Migraine with aura and without status migrainosus, not intractable   . Gait disturbance, post-stroke   . Hemiplegia, post-stroke ([place])   . Aphasia, post-stroke   . Dysarthria, post-stroke   .  ICH (intracerebral hemorrhage) (HCC) - R basal ganglia due to hypertensive emergency 04/01/2016  . Upper airway cough syndrome 12/06/2015  . PCP NOTES >>>>>>>>>>>>>>>>>>>>>>>>>>>.. 09/25/2015  . Insomnia 12/28/2012  . Annual physical exam 11/23/2011  . Essential hypertension 10/19/2007    Kitara Hebb 08/08/2018, 5:03 PM  [facility] [address] [place], [place], [address] Phone: [phone]   Fax:  [phone]  Name: Abigail Campos MRN: [id number] Date of Birth: 1971/08/31

## 2018-08-09 ENCOUNTER — Other Ambulatory Visit: Payer: Self-pay | Admitting: Internal Medicine

## 2018-08-10 ENCOUNTER — Encounter: Payer: 59 | Admitting: Occupational Therapy

## 2018-08-17 ENCOUNTER — Encounter: Payer: 59 | Admitting: Occupational Therapy

## 2018-08-17 ENCOUNTER — Ambulatory Visit: Payer: 59 | Attending: Physical Medicine & Rehabilitation | Admitting: Physical Therapy

## 2018-08-17 ENCOUNTER — Encounter: Payer: Self-pay | Admitting: Physical Therapy

## 2018-08-17 ENCOUNTER — Telehealth: Payer: Self-pay | Admitting: Physical Therapy

## 2018-08-17 DIAGNOSIS — R29818 Other symptoms and signs involving the nervous system: Secondary | ICD-10-CM

## 2018-08-17 DIAGNOSIS — M6281 Muscle weakness (generalized): Secondary | ICD-10-CM | POA: Diagnosis present

## 2018-08-17 DIAGNOSIS — M25612 Stiffness of left shoulder, not elsewhere classified: Secondary | ICD-10-CM | POA: Insufficient documentation

## 2018-08-17 DIAGNOSIS — R2689 Other abnormalities of gait and mobility: Secondary | ICD-10-CM | POA: Diagnosis present

## 2018-08-17 DIAGNOSIS — I69352 Hemiplegia and hemiparesis following cerebral infarction affecting left dominant side: Secondary | ICD-10-CM | POA: Diagnosis present

## 2018-08-17 NOTE — Telephone Encounter (Signed)
Saw patient for PT evaluation today. Patient reports inability to wear prior AFO for L LE - interested in obtaining new brace and gait training. Could you please enter an order for L AFO into Epic.   Thanks! Lanney Gins, PT, DPT 08/17/18 4:24 PM Pager: [phone]

## 2018-08-17 NOTE — Therapy (Signed)
[facility] [address] [place], [place], [address] Phone: [phone]   Fax:  [phone]  Physical Therapy Evaluation  Patient Details  Name: Abigail Campos MRN: [id number] Date of Birth: 15-Dec-1970 Referring Provider: Dr. Delice Lesch   Encounter Date: 08/17/2018  PT End of Session - 08/17/18 1549    Visit Number  1    Number of Visits  12    Date for PT Re-Evaluation  09/28/18    PT Start Time  1540    PT Stop Time  1614    PT Time Calculation (min)  34 min    Activity Tolerance  Patient tolerated treatment well    Behavior During Therapy  [facility] for tasks assessed/performed       Past Medical History:  Diagnosis Date  . Essential hypertension 10/19/2007   03-2010: metoprolol changed to bystolic (was not feeling well on it, no specific allergy or reaction)    . Hyperlipemia   . Hypertension   . Stroke [facility])     Past Surgical History:  Procedure Laterality Date  . ABDOMINAL HYSTERECTOMY  11-14-07   no oophorectomy per surgical report  . CESAREAN SECTION     [id number]  . INGUINAL HERNIA REPAIR     2004    There were no vitals filed for this visit.   Subjective Assessment - 08/17/18 1544    Subjective  patient s/p CVA 2017 - residual L sided weakness (UE/LE). Ambulates with quad cane. Interested in coming to get new brace and gait train with brace. Reports L knee  buckling yesterday - fell to floor - did not hit head or injured. Reports recent botox injection to gastroc/soleus - now able to straighten knee without issue in sitting.     Pertinent History  CVA 2017 with L hemiplegia, aphasia, HTN (controlled)    Limitations  Walking    Patient Stated Goals  wants to have more control of L LE and strengthening program    Currently in Pain?  No/denies         [facility] PT Assessment - 08/17/18 0001      Assessment   Medical Diagnosis  Spastic hemiplegia of L side; gait disturbance    Referring Provider   Dr. Delice Lesch    Onset Date/Surgical Date  06/15/18    Hand Dominance  Left    Next MD Visit  09/21/18    Prior Therapy  2018      Precautions   Precautions  Fall    Precaution Comments  Has had several falls since discharge 2018; reports fall yesterday (08/16/18)      Restrictions   Weight Bearing Restrictions  No      Balance Screen   Has the patient fallen in the past 6 months  Yes    How many times?  1    Has the patient had a decrease in activity level because of a fear of falling?   No    Is the patient reluctant to leave their home because of a fear of falling?   No      Home Social worker  Private residence    Living Arrangements  Spouse/significant other;Children    Type of [place] Access  Level entry    [place] - manual;Shower seat;Bedside commode;Hand held shower head   hemi-walker  Prior Function   Level of Independence  Independent with basic ADLs    Vocation  On disability    Vocation Requirements  Worked for medical records dept for [employer]  walks 30 min/day around neighborhood      Cognition   Overall Cognitive Status  Within Functional Limits for tasks assessed      Sensation   Light Touch  Appears Intact    Additional Comments  reports noraml/equal sensation of L LE compared to R LE      Coordination   Gross Motor Movements are Fluid and Coordinated  No    Coordination and Movement Description  L LE spasticity; difficulty sequencing movements and performing fractionated motion at ankle - unable to attempt DF without knee extension      Posture/Postural Control   Posture/Postural Control  Postural limitations    Postural Limitations  Posterior pelvic tilt;Weight shift right      Tone   Assessment Location  Left Lower Extremity      ROM / Strength   AROM / PROM / Strength  AROM;PROM;Strength      AROM   Overall AROM   Deficits     Overall AROM Comments  deficits due to chronic CVA with L residual hemiparesis - recent Botox injection with new ability to fully extend L knee; AROM limited also by reduced strength      PROM   Overall PROM   Within functional limits for tasks performed    Overall PROM Comments  able to take hip, knee, ankle through PROM without discomfort      Strength   Overall Strength  Deficits    Strength Assessment Site  Hip;Knee;Ankle    Right/Left Hip  Left    Left Hip Flexion  3/5    Left Hip Extension  2/5    Left Hip ABduction  2/5    Right/Left Knee  Left    Left Knee Flexion  2+/5    Left Knee Extension  3/5    Right/Left Ankle  Left    Left Ankle Dorsiflexion  1/5    Left Ankle Plantar Flexion  --      Flexibility   Soft Tissue Assessment /Muscle Length  yes    Hamstrings  L mild tightness      Ambulation/Gait   Ambulation/Gait  Yes    Ambulation/Gait Assistance  5: Supervision;6: Modified independent (Device/Increase time)    Ambulation/Gait Assistance Details  increased time and effort; slow pace    Ambulation Distance (Feet)  100 Feet    Assistive device  Large base quad cane    Gait Pattern  Step-to pattern;Decreased arm swing - left;Decreased step length - right;Decreased step length - left;Decreased hip/knee flexion - left;Decreased dorsiflexion - left;Decreased weight shift to left;Left circumduction;Left hip hike;Lateral trunk lean to right    Ambulation Surface  Level;Indoor    Gait velocity  --   0.789 with LBQC; 0.77 with quad attachment     Standardized Balance Assessment   Standardized Balance Assessment  Timed Up and Go Test      Timed Up and Go Test   TUG  Normal TUG    Normal TUG (seconds)  --   35.8 with LBQC; 36.7 with quad attachment     LLE Tone   LLE Tone  Moderate                Objective measurements completed on examination: See above findings.  PT Education - 08/17/18 1652    Education Details  exam findings,  POC    Person(s) Educated  Patient    Methods  Explanation    Comprehension  Verbalized understanding       PT Short Term Goals - 08/17/18 1657      PT SHORT TERM GOAL #1   Title  patient to be indpendent with initial HEP for improved strength, balance and gait mechanics    Time  4    Period  Weeks    Status  New      PT SHORT TERM GOAL #2   Title  patient to improve TUG to </= 28 seconds to decrease fall risk    Baseline  ~35 seconds with LBQC    Time  4    Period  Weeks    Status  New      PT SHORT TERM GOAL #3   Title  Patient to [employer] ambulation with LRAD over various surfaces for improved functional mobility.         PT Long Term Goals - 08/17/18 1700      PT LONG TERM GOAL #1   Title  patient to be independent with final HEP for improved functional mobility    Time  6    Period  Weeks    Status  New      PT LONG TERM GOAL #2   Title  patient to improve gait speed to >/=1.8 ft/sec with LRAD demosntrating reduced fall risk    Baseline  0.78 with [facility] 08/17/18    Time  6    Period  Weeks    Status  New      PT LONG TERM GOAL #3   Title  patient to demonstrate improved L LE strength by at least 1 grade demonstrating improved strength needed for mobility    Time  6    Period  Weeks    Status  New      PT LONG TERM GOAL #4   Title  patient to perform TUG with LRAD </= 20 seconds for reduced fall risk    Time  6    Period  Weeks    Status  New      PT LONG TERM GOAL #5   Title  patient to report plan to join fitness center/community gym for full carryover of PT benefit    Time  6    Period  Weeks    Status  New             Plan - 08/17/18 1653    Clinical Impression Statement  Abigail Campos is a very pleasant 47 y/o female presenting to OPPT today regarding primary complaints of L LE weakness with difficulty walking. On evaluation, aptient ambulating with large based quad cane (of which was bariatric) with reduced hip/knee flexion  during swing phase, poor heel strike with hip circumduction to clear LE from floor, R lateral lean, along with ankel PF/inversion. Of note, patient with recent Botox injection to L gastroc/soleus with patient reporting new ability to fully extend L knee. Patient also demonstrating significant weakness on L LE which is affecting her functional mobility. Note sent to MD regarding patient request for new AFO. Patient to benefit from skilled PT intervention to address gait mechanics, strength, balance, and other aspects of functional mobility to reduce fall risk.     History and Personal Factors relevant to plan of care:  CVA 2017 with  L hemiplegia, HTN, spasticity, falls    Clinical Presentation  Evolving    Clinical Decision Making  Moderate    Rehab Potential  Good    PT Frequency  2x / week    PT Duration  6 weeks    PT Treatment/Interventions  ADLs/Self Care Home Management;Electrical Stimulation;Therapeutic exercise;Therapeutic activities;Functional mobility training;Stair training;Gait training;DME Instruction;Balance training;Neuromuscular re-education;Patient/family education;Manual techniques;Taping;Splinting;Passive range of motion    PT Next Visit Plan  begin strengthening program, gait with SBQC vs qaud attachment    Consulted and Agree with Plan of Care  Patient       Patient will benefit from skilled therapeutic intervention in order to improve the following deficits and impairments:  Abnormal gait, Decreased activity tolerance, Decreased balance, Decreased safety awareness, Decreased coordination, Decreased range of motion, Decreased mobility, Difficulty walking, Decreased knowledge of use of DME, Decreased strength, Decreased endurance  Visit Diagnosis: Hemiplegia and hemiparesis following cerebral infarction affecting left dominant side (HCC)  Other symptoms and signs involving the nervous system  Other abnormalities of gait and mobility  Muscle weakness  (generalized)     Problem List Patient Active Problem List   Diagnosis Date Noted  . Spastic hemiparesis affecting dominant side ([place]) 01/04/2018  . Hyperglycemia 12/29/2016  . Anxiety and depression 12/29/2016  . Muscle spasticity   . Obesity (BMI 30.0-34.9) 04/08/2016  . HLD (hyperlipidemia) 04/08/2016  . Basal ganglia hemorrhage ([place]) 04/08/2016  . Dysphagia as late effect of cerebrovascular disease   . Migraine with aura and without status migrainosus, not intractable   . Gait disturbance, post-stroke   . Hemiplegia, post-stroke ([place])   . Aphasia, post-stroke   . Dysarthria, post-stroke   . ICH (intracerebral hemorrhage) (HCC) - R basal ganglia due to hypertensive emergency 04/01/2016  . Upper airway cough syndrome 12/06/2015  . PCP NOTES >>>>>>>>>>>>>>>>>>>>>>>>>>>.. 09/25/2015  . Insomnia 12/28/2012  . Annual physical exam 11/23/2011  . Essential hypertension 10/19/2007    Lanney Gins, PT, DPT 08/17/18 5:07 PM Pager: [phone]   [facility] [address] [place], [place], [address] Phone: [phone]   Fax:  [phone]  Name: Abigail Campos MRN: [id number] Date of Birth: 06-Dec-1971

## 2018-08-18 ENCOUNTER — Other Ambulatory Visit: Payer: Self-pay

## 2018-08-18 MED ORDER — BUPROPION HCL 100 MG PO TABS: 100.0000 mg | ORAL_TABLET | Freq: Two times a day (BID) | ORAL | 0 refills | Status: DC

## 2018-08-23 ENCOUNTER — Other Ambulatory Visit: Payer: Self-pay | Admitting: Internal Medicine

## 2018-08-24 ENCOUNTER — Ambulatory Visit: Payer: 59 | Admitting: Physical Therapy

## 2018-08-24 ENCOUNTER — Encounter: Payer: Self-pay | Admitting: Physical Therapy

## 2018-08-24 DIAGNOSIS — R29818 Other symptoms and signs involving the nervous system: Secondary | ICD-10-CM

## 2018-08-24 DIAGNOSIS — I69352 Hemiplegia and hemiparesis following cerebral infarction affecting left dominant side: Secondary | ICD-10-CM

## 2018-08-24 DIAGNOSIS — R2689 Other abnormalities of gait and mobility: Secondary | ICD-10-CM

## 2018-08-24 DIAGNOSIS — M6281 Muscle weakness (generalized): Secondary | ICD-10-CM

## 2018-08-24 NOTE — Therapy (Signed)
[facility] [address] [place], [place], [address] Phone: [phone]   Fax:  [phone]  Physical Therapy Treatment  Patient Details  Name: Abigail Campos MRN: [id number] Date of Birth: 06/06/1971 Referring Provider: Dr. Delice Lesch   Encounter Date: 08/24/2018  PT End of Session - 08/24/18 1411    Visit Number  2    Number of Visits  12    Date for PT Re-Evaluation  09/28/18    PT Start Time  1407    PT Stop Time  1450    PT Time Calculation (min)  43 min    Activity Tolerance  Patient tolerated treatment well    Behavior During Therapy  [facility] for tasks assessed/performed       Past Medical History:  Diagnosis Date  . Essential hypertension 10/19/2007   03-2010: metoprolol changed to bystolic (was not feeling well on it, no specific allergy or reaction)    . Hyperlipemia   . Hypertension   . Stroke [facility])     Past Surgical History:  Procedure Laterality Date  . ABDOMINAL HYSTERECTOMY  11-14-07   no oophorectomy per surgical report  . CESAREAN SECTION     [id number]  . INGUINAL HERNIA REPAIR     2004    There were no vitals filed for this visit.  Subjective Assessment - 08/24/18 1411    Subjective  doing well - no new complaints; wearing AFO    Pertinent History  CVA 2017 with L hemiplegia, aphasia, HTN (controlled)    Patient Stated Goals  wants to have more control of L LE and strengthening program    Currently in Pain?  No/denies    Multiple Pain Sites  No          [facility] Adult PT Treatment/Exercise - 08/24/18 0001      Ambulation/Gait   Ambulation/Gait  Yes    Ambulation/Gait Assistance  5: Supervision;4: Min guard    Ambulation Distance (Feet)  --   1 lap around PT gym   Assistive device  --   cane with quad attachment   Gait Pattern  Step-to pattern;Decreased arm swing - left;Decreased step length - right;Decreased step length - left;Decreased hip/knee flexion - left;Decreased  dorsiflexion - left;Decreased weight shift to left;Left circumduction;Left hip hike;Lateral trunk lean to right    Ambulation Surface  Level;Indoor    Gait Comments  verbal cueing to keep device close to body for safety and efficiency of gait; patient with subjective reports of unsteadiness; difficulty turning corners.          Neuro Re-Ed: (all performed in // bars) -Narrow BOS with intermittent 1 UE support 4 x 30 sec -B tandem stance with 1 UE support - difficulty with weight shift onto L LE when posterior as well as reduced knee extension when in this stance -tandem gait 3 x 10' with verbal cueing for control of adduction of L LE  - L toe taps to 4" step x 20 with focus on coupled hip/knee flexion rahter than circumduction -Narrow BOS with horizontal head turns  Ther Ex:  - L hip/knee flexion x 20 in static stance with tactile cueing and assist from PT to reduce circumduction -L hip flexion with extended knee x 15 with verbal cueing for spatial awareness of LE as well as upright posturing to reduce R lateral lean - sit to stand from arm cair without UE support x 10    PT Short Term Goals - 08/17/18  [place] #1   Title  patient to be indpendent with initial HEP for improved strength, balance and gait mechanics    Time  4    Period  Weeks    Status  New      PT SHORT TERM GOAL #2   Title  patient to improve TUG to </= 28 seconds to decrease fall risk    Baseline  ~35 seconds with LBQC    Time  4    Period  Weeks    Status  New      PT SHORT TERM GOAL #3   Title  Patient to [employer] ambulation with LRAD over various surfaces for improved functional mobility.         PT Long Term Goals - 08/17/18 1700      PT LONG TERM GOAL #1   Title  patient to be independent with final HEP for improved functional mobility    Time  6    Period  Weeks    Status  New    [place] Date  09/28/18      PT LONG TERM GOAL #2   Title  patient to improve gait  speed to >/=1.8 ft/sec with LRAD demosntrating reduced fall risk    Baseline  0.78 with [facility] 08/17/18    Time  6    Period  Weeks    Status  New    [place] Date  09/28/18      PT LONG TERM GOAL #3   Title  patient to demonstrate improved L LE strength by at least 1 grade demonstrating improved strength needed for mobility    Time  6    Period  Weeks    Status  New    [place] Date  09/28/18      PT LONG TERM GOAL #4   Title  patient to perform TUG with LRAD </= 20 seconds for reduced fall risk    Time  6    Period  Weeks    Status  New    [place] Date  09/28/18      PT LONG TERM GOAL #5   Title  patient to report plan to join fitness center/community gym for full carryover of PT benefit    Time  6    Period  Weeks    Status  New    [place] Date  09/28/18            Plan - 08/24/18 1515    Clinical Impression Statement  Patient doing well today - very motivated to begin PT to progress mobility. Patient ambulating in clinic during session with cane with quad attachment. Requires verbal cueing to keep device close to her body as she is used to [facility]. Patient in first prescibed AFO with improved foot clearance during gait but with subjective reports of ill-fit. Balance activities intiated as well with required need for 1 UE support as well as verbal cueing to allow for weight shifting onto impaired LE and reduced hip sag in narrow BOS. Patient to continue to benefit from skilled PT.     Rehab Potential  Good    PT Frequency  2x / week    PT Duration  6 weeks    PT Treatment/Interventions  ADLs/Self Care Home Management;Electrical Stimulation;Therapeutic exercise;Therapeutic activities;Functional mobility training;Stair training;Gait training;DME Instruction;Balance training;Neuromuscular re-education;Patient/family education;Manual techniques;Taping;Splinting;Passive range of motion    PT Next Visit Plan  begin strengthening program,  gait with SBQC vs qaud attachment; wants to work on  bed level transfers to improve at home    Consulted and Agree with Plan of Care  Patient       Patient will benefit from skilled therapeutic intervention in order to improve the following deficits and impairments:  Abnormal gait, Decreased activity tolerance, Decreased balance, Decreased safety awareness, Decreased coordination, Decreased range of motion, Decreased mobility, Difficulty walking, Decreased knowledge of use of DME, Decreased strength, Decreased endurance  Visit Diagnosis: Hemiplegia and hemiparesis following cerebral infarction affecting left dominant side (HCC)  Other symptoms and signs involving the nervous system  Other abnormalities of gait and mobility  Muscle weakness (generalized)     Problem List Patient Active Problem List   Diagnosis Date Noted  . Spastic hemiparesis affecting dominant side ([place]) 01/04/2018  . Hyperglycemia 12/29/2016  . Anxiety and depression 12/29/2016  . Muscle spasticity   . Obesity (BMI 30.0-34.9) 04/08/2016  . HLD (hyperlipidemia) 04/08/2016  . Basal ganglia hemorrhage ([place]) 04/08/2016  . Dysphagia as late effect of cerebrovascular disease   . Migraine with aura and without status migrainosus, not intractable   . Gait disturbance, post-stroke   . Hemiplegia, post-stroke ([place])   . Aphasia, post-stroke   . Dysarthria, post-stroke   . ICH (intracerebral hemorrhage) (HCC) - R basal ganglia due to hypertensive emergency 04/01/2016  . Upper airway cough syndrome 12/06/2015  . PCP NOTES >>>>>>>>>>>>>>>>>>>>>>>>>>>.. 09/25/2015  . Insomnia 12/28/2012  . Annual physical exam 11/23/2011  . Essential hypertension 10/19/2007     Lanney Gins, PT, DPT 08/24/18 3:27 PM Pager: [phone]  [facility] [address] [place], [place], [address] Phone: [phone]   Fax:  [phone]  Name: Abigail Campos MRN: [id number] Date of Birth: 22-Jul-1971

## 2018-08-26 ENCOUNTER — Ambulatory Visit: Payer: 59 | Admitting: Physical Therapy

## 2018-08-26 ENCOUNTER — Encounter: Payer: Self-pay | Admitting: Physical Therapy

## 2018-08-26 DIAGNOSIS — I69352 Hemiplegia and hemiparesis following cerebral infarction affecting left dominant side: Secondary | ICD-10-CM | POA: Diagnosis not present

## 2018-08-26 DIAGNOSIS — R29818 Other symptoms and signs involving the nervous system: Secondary | ICD-10-CM

## 2018-08-26 DIAGNOSIS — M6281 Muscle weakness (generalized): Secondary | ICD-10-CM

## 2018-08-26 DIAGNOSIS — R2689 Other abnormalities of gait and mobility: Secondary | ICD-10-CM

## 2018-08-26 NOTE — Therapy (Signed)
[facility] [address] [place], [place], [address] Phone: [phone]   Fax:  [phone]  Physical Therapy Treatment  Patient Details  Name: Abigail Campos MRN: [id number] Date of Birth: [birth year] Referring Provider: Dr. Delice Lesch   Encounter Date: 08/26/2018  PT End of Session - 08/26/18 1325    Visit Number  3    Number of Visits  12    Date for PT Re-Evaluation  09/28/18    PT Start Time  1320    PT Stop Time  1401    PT Time Calculation (min)  41 min    Activity Tolerance  Patient tolerated treatment well    Behavior During Therapy  [facility] for tasks assessed/performed       Past Medical History:  Diagnosis Date  . Essential hypertension 10/19/2007   03-2010: metoprolol changed to bystolic (was not feeling well on it, no specific allergy or reaction)    . Hyperlipemia   . Hypertension   . Stroke [facility])     Past Surgical History:  Procedure Laterality Date  . ABDOMINAL HYSTERECTOMY  11-14-07   no oophorectomy per surgical report  . CESAREAN SECTION     [id number]  . INGUINAL HERNIA REPAIR     2004    There were no vitals filed for this visit.  Subjective Assessment - 08/26/18 1324    Subjective  having some muscle soreness - frustrated about hip/knee flexion activity    Pertinent History  CVA 2017 with L hemiplegia, aphasia, HTN (controlled)    Patient Stated Goals  wants to have more control of L LE and strengthening program    Currently in Pain?  No/denies                       [facility] Adult PT Treatment/Exercise - 08/26/18 1330      Ambulation/Gait   Ambulation/Gait  Yes    Ambulation/Gait Assistance  5: Supervision;4: Min guard    Ambulation/Gait Assistance Details  increased effort due to reports fatigue    Ambulation Distance (Feet)  --   1 lap around gym   Assistive device  --   quad tip cane   Gait Pattern  Step-to pattern;Step-through pattern;Decreased stride  length;Decreased hip/knee flexion - left;Decreased dorsiflexion - left;Decreased weight shift to left    Ambulation Surface  Level;Indoor    Gait Comments  able to take large steps, but unable to perform 2-point gait pattern, performing 3-point currently; difficulty navigating narrow spaces      Exercises   Exercises  Knee/Hip      Knee/Hip Exercises: Seated   Long Arc Quad  Left;15 reps   focusing on control of movement   Knee/Hip Flexion  L LE x 12 reps    Abduction/Adduction   Left;15 reps    Abd/Adduction Limitations  unilateral ball squeeze with PT; VC to reduce trunk rotation          Balance Exercises - 08/26/18 1335      Balance Exercises: Standing   Standing Eyes Opened  Narrow base of support (BOS);Solid surface;Head turns    Standing Eyes Closed  Narrow base of support (BOS);Solid surface;4 reps;10 secs    Tandem Stance  Eyes open;Intermittent upper extremity support;3 reps;10 secs   difficulty with weight shifting onto L LE   Sit to Stand Time  sit to stand form mat table - working on even wwight shifitng and controlled sit without shifitng all of  weight onto R LE          PT Short Term Goals - 08/17/18 1657      PT SHORT TERM GOAL #1   Title  patient to be indpendent with initial HEP for improved strength, balance and gait mechanics    Time  4    Period  Weeks    Status  New      PT SHORT TERM GOAL #2   Title  patient to improve TUG to </= 28 seconds to decrease fall risk    Baseline  ~35 seconds with LBQC    Time  4    Period  Weeks    Status  New      PT SHORT TERM GOAL #3   Title  Patient to [employer] ambulation with LRAD over various surfaces for improved functional mobility.         PT Long Term Goals - 08/17/18 1700      PT LONG TERM GOAL #1   Title  patient to be independent with final HEP for improved functional mobility    Time  6    Period  Weeks    Status  New    Target Date  09/28/18      PT LONG TERM GOAL #2   Title   patient to improve gait speed to >/=1.8 ft/sec with LRAD demosntrating reduced fall risk    Baseline  0.78 with [facility] 08/17/18    Time  6    Period  Weeks    Status  New    Target Date  09/28/18      PT LONG TERM GOAL #3   Title  patient to demonstrate improved L LE strength by at least 1 grade demonstrating improved strength needed for mobility    Time  6    Period  Weeks    Status  New    Target Date  09/28/18      PT LONG TERM GOAL #4   Title  patient to perform TUG with LRAD </= 20 seconds for reduced fall risk    Time  6    Period  Weeks    Status  New    Target Date  09/28/18      PT LONG TERM GOAL #5   Title  patient to report plan to join fitness center/community gym for full carryover of PT benefit    Time  6    Period  Weeks    Status  New    Target Date  09/28/18            Plan - 08/26/18 1326    Clinical Impression Statement  Patient reporting muscle fatigue following last session - having most difficulty with forward hip/knee flexion today reporting reduced strength limiting. PT session today focusing on L LE strength, balance with equal weight shift, and gait with quad attachment. Most difficulty with equal weight shift with sit <> stand with preference for weight bearing onto R LE only as well as difficulty with L LE weight bearing in tandem stance. Plan for next visit to include AFO fitting with [name] scheduled to join PT treatment.     Rehab Potential  Good    PT Frequency  2x / week    PT Duration  6 weeks    PT Treatment/Interventions  ADLs/Self Care Home Management;Electrical Stimulation;Therapeutic exercise;Therapeutic activities;Functional mobility training;Stair training;Gait training;DME Instruction;Balance training;Neuromuscular re-education;Patient/family education;Manual techniques;Taping;Splinting;Passive range of motion    PT Next  Visit Plan  begin strengthening program, gait with SBQC vs qaud attachment;     Consulted and Agree with Plan of Care   Patient       Patient will benefit from skilled therapeutic intervention in order to improve the following deficits and impairments:  Abnormal gait, Decreased activity tolerance, Decreased balance, Decreased safety awareness, Decreased coordination, Decreased range of motion, Decreased mobility, Difficulty walking, Decreased knowledge of use of DME, Decreased strength, Decreased endurance  Visit Diagnosis: Hemiplegia and hemiparesis following cerebral infarction affecting left dominant side (HCC)  Other symptoms and signs involving the nervous system  Other abnormalities of gait and mobility  Muscle weakness (generalized)     Problem List Patient Active Problem List   Diagnosis Date Noted  . Spastic hemiparesis affecting dominant side ([place]) 01/04/2018  . Hyperglycemia 12/29/2016  . Anxiety and depression 12/29/2016  . Muscle spasticity   . Obesity (BMI 30.0-34.9) 04/08/2016  . HLD (hyperlipidemia) 04/08/2016  . Basal ganglia hemorrhage ([place]) 04/08/2016  . Dysphagia as late effect of cerebrovascular disease   . Migraine with aura and without status migrainosus, not intractable   . Gait disturbance, post-stroke   . Hemiplegia, post-stroke ([place])   . Aphasia, post-stroke   . Dysarthria, post-stroke   . ICH (intracerebral hemorrhage) (HCC) - R basal ganglia due to hypertensive emergency 04/01/2016  . Upper airway cough syndrome 12/06/2015  . PCP NOTES >>>>>>>>>>>>>>>>>>>>>>>>>>>.. 09/25/2015  . Insomnia 12/28/2012  . Annual physical exam 11/23/2011  . Essential hypertension 10/19/2007     Lanney Gins, PT, DPT 08/26/18 3:17 PM Pager: [phone]   [facility] [address] [place], [place], [address] Phone: [phone]   Fax:  [phone]  Name: Abigail Campos MRN: [id number] Date of Birth: Sep 03, 1971

## 2018-08-29 ENCOUNTER — Ambulatory Visit: Payer: 59 | Admitting: Occupational Therapy

## 2018-08-31 ENCOUNTER — Ambulatory Visit: Payer: 59 | Admitting: Physical Therapy

## 2018-08-31 ENCOUNTER — Encounter: Payer: Self-pay | Admitting: Physical Therapy

## 2018-08-31 ENCOUNTER — Ambulatory Visit: Payer: 59 | Admitting: Occupational Therapy

## 2018-08-31 DIAGNOSIS — I69352 Hemiplegia and hemiparesis following cerebral infarction affecting left dominant side: Secondary | ICD-10-CM

## 2018-08-31 DIAGNOSIS — M6281 Muscle weakness (generalized): Secondary | ICD-10-CM

## 2018-08-31 DIAGNOSIS — R2689 Other abnormalities of gait and mobility: Secondary | ICD-10-CM

## 2018-08-31 DIAGNOSIS — R29818 Other symptoms and signs involving the nervous system: Secondary | ICD-10-CM

## 2018-08-31 DIAGNOSIS — M25612 Stiffness of left shoulder, not elsewhere classified: Secondary | ICD-10-CM

## 2018-08-31 NOTE — Patient Instructions (Signed)
Continue to perform previous exercises 10 reps 2-3x day  Stretch for the floor in seated with both hands, Table slides forwards and backwards with both hands 10 x  turn palm of left hand up and down 10x Laying on your back clasp your left wrist with right hand, raise arms overhead 10 x.   Wear your resting hand splint at night time. Wear your oval 8 or thumb spica splint during daytime with activity, if oval 8 rubs your hand then switch to thumb spica splint.

## 2018-08-31 NOTE — Therapy (Signed)
[facility] [address] [place], [place], [address] Phone: [phone]   Fax:  [phone]  Occupational Therapy Treatment  Patient Details  Name: Abigail Campos MRN: [id number] Date of Birth: 22-Jul-1971 Referring Provider: Dr. Delice Lesch   Encounter Date: 08/31/2018  OT End of Session - 08/31/18 1636    Visit Number  6    Number of Visits  9    Authorization Type  240 visit limit /  0 used    Authorization - Visit Number  5    Authorization - Number of Visits  240    OT Start Time  1572   pt late   OT Stop Time  1445    OT Time Calculation (min)  33 min    Activity Tolerance  Patient tolerated treatment well    Behavior During Therapy  [facility] for tasks assessed/performed       Past Medical History:  Diagnosis Date  . Essential hypertension 10/19/2007   03-2010: metoprolol changed to bystolic (was not feeling well on it, no specific allergy or reaction)    . Hyperlipemia   . Hypertension   . Stroke [facility])     Past Surgical History:  Procedure Laterality Date  . ABDOMINAL HYSTERECTOMY  11-14-07   no oophorectomy per surgical report  . CESAREAN SECTION     [id number]  . INGUINAL HERNIA REPAIR     2004    There were no vitals filed for this visit.  Subjective Assessment - 08/31/18 1646    Subjective   Pt reports less triggering in RUE thumb since she has been wearing thumb spica splint    Currently in Pain?  No/denies                 Treatment: Supine closed chain shoulder flexion with pt performing self ROM, min v.c Seated AA/ROM shoulder flexion with pt LUE supported on cane assisting with RUE, min v.c Supine, reach for floor, min v.c  Pt was issued oval 8 splint to wear as alternative to thumb spica splint as thumb spica is cumbersome. Pt demonstrates ability to don/ doff oval 8 independently.          OT Education - 08/31/18 1644    Education Details  HEP for LUE, splint wear,  care and precautions, oval 8 splint issued for pt to alternate with thumb spica splint to minimize triggering    Person(s) Educated  Patient    Methods  Explanation;Demonstration;Verbal cues;Handout    Comprehension  Verbalized understanding;Returned demonstration;Verbal cues required       OT Short Term Goals - 08/01/18 1542      OT SHORT TERM GOAL #1   Title  XXX      OT SHORT TERM GOAL #2   Title  XXX      OT SHORT TERM GOAL #3   Title  XXX      OT SHORT TERM GOAL #4   Title  XXX      OT SHORT TERM GOAL #5   Title  XXX        OT Long Term Goals - 08/31/18 1637      OT LONG TERM GOAL #1   Title  Patient will perform a home stretching program for left upper extremity DUE 927/19    Status  On-going   date extended due to pt on hold for botox     OT LONG TERM GOAL #2   Title  Patient will  demonstrate knowledge of positioning means to reduce tension in left UE.      Status  On-going      OT LONG TERM GOAL #3   Title  Patient will doff/don left splint with no more than verbal cueing     Status  Partially Met      OT LONG TERM GOAL #4   Title  Patient will incorporate left upper extremity as gross assist to right upper extremity 25%/time with ADL    Status  On-going      OT LONG TERM GOAL #5   Title  Patient will demonstrate understanding of movements that produce triggering in RIGHT thumb     Status  On-going   Pt verbalizes understadning of splint wear and care to minimze triggering           Plan - 08/31/18 1640    Clinical Impression Statement  Pt is progressing toward goals. Pt reports decreased overal tiggering after wearing thumb spica splint for RUE. Pt returns to therapy after botox injection.    Occupational Profile and client history currently impacting functional performance  Wife, mother, daughter, homemaker - initial stroke occured in 2018    Occupational performance deficits (Please refer to evaluation for details):  ADL's;IADL's;Rest and Sleep     Rehab Potential  Fair    OT Frequency  2x / week    OT Duration  4 weeks    OT Treatment/Interventions  Self-care/ADL training;Electrical Stimulation;Therapeutic exercise;Visual/perceptual remediation/compensation;Patient/family education;Splinting;Neuromuscular education;Moist Heat;Aquatic Therapy;Fluidtherapy;Therapist, nutritional;Therapeutic activities;Balance training;Cognitive remediation/compensation;Manual Therapy;DME and/or AE instruction;Ultrasound    Plan   splint check, progress HEP and work towards long term goals    Consulted and Agree with Plan of Care  Patient       Patient will benefit from skilled therapeutic intervention in order to improve the following deficits and impairments:  Decreased cognition, Decreased knowledge of use of DME, Impaired flexibility, Impaired vision/preception, Pain, Improper body mechanics, Impaired sensation, Decreased mobility, Decreased coordination, Decreased activity tolerance, Decreased range of motion, Decreased strength, Increased muscle spasms, Impaired tone, Impaired UE functional use, Impaired perceived functional ability, Decreased balance, Decreased safety awareness  Visit Diagnosis: Hemiplegia and hemiparesis following cerebral infarction affecting left dominant side (HCC)  Other symptoms and signs involving the nervous system  Muscle weakness (generalized)  Spastic hemiplegia of left dominant side as late effect of cerebral infarction (HCC)  Stiffness of left shoulder, not elsewhere classified    Problem List Patient Active Problem List   Diagnosis Date Noted  . Spastic hemiparesis affecting dominant side ([place]) 01/04/2018  . Hyperglycemia 12/29/2016  . Anxiety and depression 12/29/2016  . Muscle spasticity   . Obesity (BMI 30.0-34.9) 04/08/2016  . HLD (hyperlipidemia) 04/08/2016  . Basal ganglia hemorrhage ([place]) 04/08/2016  . Dysphagia as late effect of cerebrovascular disease   . Migraine with aura and without  status migrainosus, not intractable   . Gait disturbance, post-stroke   . Hemiplegia, post-stroke ([place])   . Aphasia, post-stroke   . Dysarthria, post-stroke   . ICH (intracerebral hemorrhage) (HCC) - R basal ganglia due to hypertensive emergency 04/01/2016  . Upper airway cough syndrome 12/06/2015  . PCP NOTES >>>>>>>>>>>>>>>>>>>>>>>>>>>.. 09/25/2015  . Insomnia 12/28/2012  . Annual physical exam 11/23/2011  . Essential hypertension 10/19/2007    RINE,KATHRYN 08/31/2018, 4:47 PM  [facility] [address] [place], [place], [address] Phone: [phone]   Fax:  [phone]  Name: Abigail Campos MRN: [id number] Date of Birth: 09/11/1971

## 2018-08-31 NOTE — Therapy (Signed)
[facility] [address] [place], [place], [address] Phone: [phone]   Fax:  [phone]  Physical Therapy Treatment  Patient Details  Name: Abigail Campos MRN: [id number] Date of Birth: 04-02-1971 Referring Provider: Dr. Delice Lesch   Encounter Date: 08/31/2018  PT End of Session - 08/31/18 1541    Visit Number  4    Number of Visits  12    Date for PT Re-Evaluation  09/28/18    PT Start Time  1324    PT Stop Time  1615    PT Time Calculation (min)  40 min    Activity Tolerance  Patient tolerated treatment well    Behavior During Therapy  [facility] for tasks assessed/performed       Past Medical History:  Diagnosis Date  . Essential hypertension 10/19/2007   03-2010: metoprolol changed to bystolic (was not feeling well on it, no specific allergy or reaction)    . Hyperlipemia   . Hypertension   . Stroke [facility])     Past Surgical History:  Procedure Laterality Date  . ABDOMINAL HYSTERECTOMY  11-14-07   no oophorectomy per surgical report  . CESAREAN SECTION     [id number]  . INGUINAL HERNIA REPAIR     2004    There were no vitals filed for this visit.  Subjective Assessment - 08/31/18 1541    Subjective  doing well - noticing she fatigues with increased weight shifting to L LE during gait and balance activities. No new complaints    Pertinent History  CVA 2017 with L hemiplegia, aphasia, HTN (controlled)    Patient Stated Goals  wants to have more control of L LE and strengthening program    Currently in Pain?  No/denies    Multiple Pain Sites  No                       [facility] Adult PT Treatment/Exercise - 08/31/18 0001      Ambulation/Gait   Ambulation/Gait  Yes    Ambulation/Gait Assistance  5: Supervision    Ambulation/Gait Assistance Details  improved step length today, continued slow pace    Ambulation Distance (Feet)  --   to/from gym   Assistive device  Large base quad cane    Gait Pattern  Step-to pattern;Step-through pattern;Decreased stride length;Decreased hip/knee flexion - left;Decreased dorsiflexion - left;Decreased weight shift to left    Ambulation Surface  Level;Indoor      Exercises   Exercises  Knee/Hip      Knee/Hip Exercises: Supine   Heel Slides  AAROM;Left;10 reps    Heel Slides Limitations  wants to compensate with hip flexion    Bridges  Strengthening;Both;15 reps    Straight Leg Raises  Strengthening;Left;15 reps    Other Supine Knee/Hip Exercises  supine clam with L LE only x 15          Balance Exercises - 08/31/18 1542      Balance Exercises: Standing   Tandem Gait  4 reps;Upper extremity support   good L foot placement; slight spatial awareness deficits   Retro Gait  4 reps;Upper extremity support   some physical assist for L hip extension into full step bwd   Sidestepping  Upper extremity support;4 reps   working on large stepping pattern   Other Standing Exercises  alternating toe taps to 6" step - tends to circumduct with difficulty clearing box without physical assist from PT  PT Short Term Goals - 08/17/18 1657      PT SHORT TERM GOAL #1   Title  patient to be indpendent with initial HEP for improved strength, balance and gait mechanics    Time  4    Period  Weeks    Status  New      PT SHORT TERM GOAL #2   Title  patient to improve TUG to </= 28 seconds to decrease fall risk    Baseline  ~35 seconds with LBQC    Time  4    Period  Weeks    Status  New      PT SHORT TERM GOAL #3   Title  Patient to [employer] ambulation with LRAD over various surfaces for improved functional mobility.         PT Long Term Goals - 08/17/18 1700      PT LONG TERM GOAL #1   Title  patient to be independent with final HEP for improved functional mobility    Time  6    Period  Weeks    Status  New    Target Date  09/28/18      PT LONG TERM GOAL #2   Title  patient to improve gait speed to >/=1.8 ft/sec  with LRAD demosntrating reduced fall risk    Baseline  0.78 with [facility] 08/17/18    Time  6    Period  Weeks    Status  New    Target Date  09/28/18      PT LONG TERM GOAL #3   Title  patient to demonstrate improved L LE strength by at least 1 grade demonstrating improved strength needed for mobility    Time  6    Period  Weeks    Status  New    Target Date  09/28/18      PT LONG TERM GOAL #4   Title  patient to perform TUG with LRAD </= 20 seconds for reduced fall risk    Time  6    Period  Weeks    Status  New    Target Date  09/28/18      PT LONG TERM GOAL #5   Title  patient to report plan to join fitness center/community gym for full carryover of PT benefit    Time  6    Period  Weeks    Status  New    Target Date  09/28/18            Plan - 08/31/18 1630    Clinical Impression Statement  Patient very pleased with current motivation and progress from short term PT intervention. Improved progress with eccentric control from stand to sit as well as improved step length with gait. PT session today focusing on more dynamic balance activities as well as general LE strengthening. Does require some physical assist for desired motor control and form of movement. Will continue to progress towards established goals.     Rehab Potential  Good    PT Frequency  2x / week    PT Duration  6 weeks    PT Treatment/Interventions  ADLs/Self Care Home Management;Electrical Stimulation;Therapeutic exercise;Therapeutic activities;Functional mobility training;Stair training;Gait training;DME Instruction;Balance training;Neuromuscular re-education;Patient/family education;Manual techniques;Taping;Splinting;Passive range of motion    PT Next Visit Plan  begin strengthening program, gait with SBQC vs qaud attachment; call Chris from [facility] - did not show up today?    Consulted and Agree with Plan of Care  Patient       Patient will benefit from skilled therapeutic intervention in order to  improve the following deficits and impairments:  Abnormal gait, Decreased activity tolerance, Decreased balance, Decreased safety awareness, Decreased coordination, Decreased range of motion, Decreased mobility, Difficulty walking, Decreased knowledge of use of DME, Decreased strength, Decreased endurance  Visit Diagnosis: Hemiplegia and hemiparesis following cerebral infarction affecting left dominant side (HCC)  Other symptoms and signs involving the nervous system  Other abnormalities of gait and mobility  Muscle weakness (generalized)     Problem List Patient Active Problem List   Diagnosis Date Noted  . Spastic hemiparesis affecting dominant side ([place]) 01/04/2018  . Hyperglycemia 12/29/2016  . Anxiety and depression 12/29/2016  . Muscle spasticity   . Obesity (BMI 30.0-34.9) 04/08/2016  . HLD (hyperlipidemia) 04/08/2016  . Basal ganglia hemorrhage ([place]) 04/08/2016  . Dysphagia as late effect of cerebrovascular disease   . Migraine with aura and without status migrainosus, not intractable   . Gait disturbance, post-stroke   . Hemiplegia, post-stroke ([place])   . Aphasia, post-stroke   . Dysarthria, post-stroke   . ICH (intracerebral hemorrhage) (HCC) - R basal ganglia due to hypertensive emergency 04/01/2016  . Upper airway cough syndrome 12/06/2015  . PCP NOTES >>>>>>>>>>>>>>>>>>>>>>>>>>>.. 09/25/2015  . Insomnia 12/28/2012  . Annual physical exam 11/23/2011  . Essential hypertension 10/19/2007     Lanney Gins, PT, DPT Supplemental Physical Therapist 08/31/18 4:40 PM Pager: [phone] Office: [place] [address] [place], [place], [address] Phone: [phone]   Fax:  [phone]  Name: Abigail Campos MRN: [id number] Date of Birth: Dec 27, 1970

## 2018-09-02 ENCOUNTER — Ambulatory Visit: Payer: 59 | Admitting: Physical Therapy

## 2018-09-02 ENCOUNTER — Encounter: Payer: Self-pay | Admitting: Occupational Therapy

## 2018-09-02 ENCOUNTER — Encounter: Payer: Self-pay | Admitting: Physical Therapy

## 2018-09-02 ENCOUNTER — Ambulatory Visit: Payer: 59 | Admitting: Occupational Therapy

## 2018-09-02 DIAGNOSIS — R29818 Other symptoms and signs involving the nervous system: Secondary | ICD-10-CM

## 2018-09-02 DIAGNOSIS — I69352 Hemiplegia and hemiparesis following cerebral infarction affecting left dominant side: Secondary | ICD-10-CM

## 2018-09-02 DIAGNOSIS — M25612 Stiffness of left shoulder, not elsewhere classified: Secondary | ICD-10-CM

## 2018-09-02 DIAGNOSIS — M6281 Muscle weakness (generalized): Secondary | ICD-10-CM

## 2018-09-02 DIAGNOSIS — R2689 Other abnormalities of gait and mobility: Secondary | ICD-10-CM

## 2018-09-02 NOTE — Therapy (Signed)
[facility] [address] [place], [place], [address] Phone: [phone]   Fax:  [phone]  Physical Therapy Treatment  Patient Details  Name: Abigail Campos MRN: [id number] Date of Birth: Dec 04, 1971 Referring Provider: Dr. Delice Lesch   Encounter Date: 09/02/2018  PT End of Session - 09/02/18 1635    Visit Number  5    Number of Visits  12    Date for PT Re-Evaluation  09/28/18    PT Start Time  5009    PT Stop Time  1446    PT Time Calculation (min)  41 min    Activity Tolerance  Patient tolerated treatment well    Behavior During Therapy  [facility] for tasks assessed/performed       Past Medical History:  Diagnosis Date  . Essential hypertension 10/19/2007   03-2010: metoprolol changed to bystolic (was not feeling well on it, no specific allergy or reaction)    . Hyperlipemia   . Hypertension   . Stroke [facility])     Past Surgical History:  Procedure Laterality Date  . ABDOMINAL HYSTERECTOMY  11-14-07   no oophorectomy per surgical report  . CESAREAN SECTION     [id number]  . INGUINAL HERNIA REPAIR     2004    There were no vitals filed for this visit.  Subjective Assessment - 09/02/18 1409    Subjective  had some muscle soreness following last visit - continues to report good compliance with HEP    Pertinent History  CVA 2017 with L hemiplegia, aphasia, HTN (controlled)    Patient Stated Goals  wants to have more control of L LE and strengthening program    Currently in Pain?  No/denies                       [facility] Adult PT Treatment/Exercise - 09/02/18 0001      Ambulation/Gait   Ambulation/Gait  Yes    Ambulation/Gait Assistance  5: Supervision;4: Min guard    Ambulation/Gait Assistance Details  wokring on outdoor surfaces - paved inclines and curbs - verbal cueing for safety and sequencing     Ambulation Distance (Feet)  200 Feet    Assistive device  Large base quad cane    Gait  Pattern  Step-to pattern;Step-through pattern;Decreased stride length;Decreased hip/knee flexion - left;Decreased dorsiflexion - left;Decreased weight shift to left    Ambulation Surface  Level;Outdoor;Paved    Gait Comments  cueing to increase L hip/knee flexion with forward excursion coming up curb      Exercises   Exercises  Knee/Hip;Lumbar      Lumbar Exercises: Seated   Long Arc Quad on [place]  Left;10 reps    LAQ on [employer] (lbs)  focusing on active/controlled motion at quad - posturing and core engagement    Hip Flexion on Ball  Left;10 reps    Hip Flexion on Ball Limitations  limited height - focusing on posturing and core activation    Other Seated Lumbar Exercises  A/P pelvic tilts on blue physioball       Knee/Hip Exercises: Stretches   Passive Hamstring Stretch  Left;3 reps;30 seconds    Passive Hamstring Stretch Limitations  seated EOM      Knee/Hip Exercises: Seated   Knee/Hip Flexion  L LE x 10 reps          Balance Exercises - 09/02/18 1425      Balance Exercises: Standing   Tandem  Gait  3 reps;Upper extremity support   at counter; tactile cueing for stepping    Retro Gait  3 reps;Upper extremity support   at counter; physical assist for L hip extension   Sidestepping  Upper extremity support;4 reps   increased R knee flexion requiring tactile cueing to reduce         PT Short Term Goals - 08/17/18 1657      PT SHORT TERM GOAL #1   Title  patient to be indpendent with initial HEP for improved strength, balance and gait mechanics    Time  4    Period  Weeks    Status  New      PT SHORT TERM GOAL #2   Title  patient to improve TUG to </= 28 seconds to decrease fall risk    Baseline  ~35 seconds with LBQC    Time  4    Period  Weeks    Status  New      PT SHORT TERM GOAL #3   Title  Patient to [employer] ambulation with LRAD over various surfaces for improved functional mobility.         PT Long Term Goals - 08/17/18 1700      PT  LONG TERM GOAL #1   Title  patient to be independent with final HEP for improved functional mobility    Time  6    Period  Weeks    Status  New    Target Date  09/28/18      PT LONG TERM GOAL #2   Title  patient to improve gait speed to >/=1.8 ft/sec with LRAD demosntrating reduced fall risk    Baseline  0.78 with [facility] 08/17/18    Time  6    Period  Weeks    Status  New    Target Date  09/28/18      PT LONG TERM GOAL #3   Title  patient to demonstrate improved L LE strength by at least 1 grade demonstrating improved strength needed for mobility    Time  6    Period  Weeks    Status  New    Target Date  09/28/18      PT LONG TERM GOAL #4   Title  patient to perform TUG with LRAD </= 20 seconds for reduced fall risk    Time  6    Period  Weeks    Status  New    Target Date  09/28/18      PT LONG TERM GOAL #5   Title  patient to report plan to join fitness center/community gym for full carryover of PT benefit    Time  6    Period  Weeks    Status  New    Target Date  09/28/18            Plan - 09/02/18 1635    Clinical Impression Statement  Patient reporting slight muscle soreness from last session and continues to reports good compliance with HEP as patient is very motivated to improve functional mobility. PT session today focusing on hip adn core strengthening with difficulty maintaining upright seated posture on compliant surface without verbal/tactile cueing. Gait training up/down paved inclines and curbs today with cueing needed for safety as well as increased L hip/knee flexion to reduce circumduction at L hip. Will continue to progress towards established goals.     Rehab Potential  Good    PT  Frequency  2x / week    PT Duration  6 weeks    PT Treatment/Interventions  ADLs/Self Care Home Management;Electrical Stimulation;Therapeutic exercise;Therapeutic activities;Functional mobility training;Stair training;Gait training;DME Instruction;Balance training;Neuromuscular  re-education;Patient/family education;Manual techniques;Taping;Splinting;Passive range of motion    PT Next Visit Plan  begin strengthening program, gait with SBQC vs qaud attachment; call Chris from [place] - did not show up today?    Consulted and Agree with Plan of Care  Patient       Patient will benefit from skilled therapeutic intervention in order to improve the following deficits and impairments:  Abnormal gait, Decreased activity tolerance, Decreased balance, Decreased safety awareness, Decreased coordination, Decreased range of motion, Decreased mobility, Difficulty walking, Decreased knowledge of use of DME, Decreased strength, Decreased endurance  Visit Diagnosis: Hemiplegia and hemiparesis following cerebral infarction affecting left dominant side (HCC)  Other symptoms and signs involving the nervous system  Other abnormalities of gait and mobility  Muscle weakness (generalized)     Problem List Patient Active Problem List   Diagnosis Date Noted  . Spastic hemiparesis affecting dominant side ([place]) 01/04/2018  . Hyperglycemia 12/29/2016  . Anxiety and depression 12/29/2016  . Muscle spasticity   . Obesity (BMI 30.0-34.9) 04/08/2016  . HLD (hyperlipidemia) 04/08/2016  . Basal ganglia hemorrhage ([place]) 04/08/2016  . Dysphagia as late effect of cerebrovascular disease   . Migraine with aura and without status migrainosus, not intractable   . Gait disturbance, post-stroke   . Hemiplegia, post-stroke ([place])   . Aphasia, post-stroke   . Dysarthria, post-stroke   . ICH (intracerebral hemorrhage) (HCC) - R basal ganglia due to hypertensive emergency 04/01/2016  . Upper airway cough syndrome 12/06/2015  . PCP NOTES >>>>>>>>>>>>>>>>>>>>>>>>>>>.. 09/25/2015  . Insomnia 12/28/2012  . Annual physical exam 11/23/2011  . Essential hypertension 10/19/2007    Lanney Gins, PT, DPT Supplemental Physical Therapist 09/02/18 4:49 PM Pager: [phone] Office:  [facility] [address] [place], [place], [address] Phone: [phone]   Fax:  [phone]  Name: DASHANA GUIZAR MRN: [id number] Date of Birth: [birth year]

## 2018-09-02 NOTE — Therapy (Signed)
[facility] [address] [place], [place], [address] Phone: [phone]   Fax:  [phone]  Occupational Therapy Treatment  Patient Details  Name: Abigail Campos MRN: [id number] Date of Birth: [birth year] Referring Provider: Dr. Delice Lesch   Encounter Date: 09/02/2018  OT End of Session - 09/02/18 1649    Visit Number  7    Authorization Type  240 visit limit /  0 used    Authorization - Visit Number  7    Authorization - Number of Visits  240    OT Start Time  4818    OT Stop Time  5631    OT Time Calculation (min)  45 min       Past Medical History:  Diagnosis Date  . Essential hypertension 10/19/2007   03-2010: metoprolol changed to bystolic (was not feeling well on it, no specific allergy or reaction)    . Hyperlipemia   . Hypertension   . Stroke [facility])     Past Surgical History:  Procedure Laterality Date  . ABDOMINAL HYSTERECTOMY  11-14-07   no oophorectomy per surgical report  . CESAREAN SECTION     [id number]  . INGUINAL HERNIA REPAIR     2004    There were no vitals filed for this visit.  Subjective Assessment - 09/02/18 1457    Subjective   Patient really likes the oval 8 splint and is wearing it all day.      Currently in Pain?  No/denies    Multiple Pain Sites  No                   OT Treatments/Exercises (OP) - 09/02/18 0001      ADLs   Functional Mobility  Worked on using her increased tension in left hand to carry  lightweight grocery bag .  Worked to carry items under left arm against body -as she walks with a cane, and this limits options.  Patient able to walk and carry a pillow across the room.      Home Maintenance  Worked on incorporating LUE into functional tasks as gross assist, as per LTG.  Patient unable to work cylindrical object into her left hand to stabilize.  She was able to use left hand as a weight to hold paper still.  Will continue to address ways in  which her left hand can assist with function.      ADL Comments  Reviewed long term goals, and patient has met most at this time.  Reviewed plan to wrap up after next 2 visits.               OT Education - 09/02/18 1648    Education Details  reviewed oval 8 splint wear and care, donning and doffing    Person(s) Educated  Patient    Methods  Explanation;Demonstration    Comprehension  Verbalized understanding;Returned demonstration       OT Short Term Goals - 08/01/18 1542      OT SHORT TERM GOAL #1   Title  XXX      OT SHORT TERM GOAL #2   Title  XXX      OT SHORT TERM GOAL #3   Title  XXX      OT SHORT TERM GOAL #4   Title  XXX      OT SHORT TERM GOAL #5   Title  XXX        OT Long Term  Goals - 09/02/18 1458      OT LONG TERM GOAL #1   Title  Patient will perform a home stretching program for left upper extremity DUE 927/19    Status  Achieved      OT LONG TERM GOAL #2   Title  Patient will demonstrate knowledge of positioning means to reduce tension in left UE.      Status  Achieved      OT LONG TERM GOAL #3   Title  Patient will doff/don left splint with no more than verbal cueing     Status  Achieved            Plan - 09/02/18 1631    Clinical Impression Statement  Patient has met nearly all of her goals.  Patient indicates she may receive Botox again after 3 mos (mid Nov) Will plan to wrap up this plan of care as expected, and reassess after next injection.      Occupational Profile and client history currently impacting functional performance  Wife, mother, daughter, homemaker - initial stroke occured in 2018    Occupational performance deficits (Please refer to evaluation for details):  ADL's;IADL's;Rest and Sleep    Rehab Potential  Fair    OT Frequency  2x / week    OT Duration  4 weeks    OT Treatment/Interventions  Self-care/ADL training;Electrical Stimulation;Therapeutic exercise;Visual/perceptual remediation/compensation;Patient/family  education;Splinting;Neuromuscular education;Moist Heat;Aquatic Therapy;Fluidtherapy;Therapist, nutritional;Therapeutic activities;Balance training;Cognitive remediation/compensation;Manual Therapy;DME and/or AE instruction;Ultrasound    Plan  address reamining LTG - use of LUE as gross assist    Clinical Decision Making  Limited treatment options, no task modification necessary    Recommended Other Services  PT ordered in March 2019 - may need to re-refer - patient with several significant falls since discharge 01/2017       Patient will benefit from skilled therapeutic intervention in order to improve the following deficits and impairments:  Decreased cognition, Decreased knowledge of use of DME, Impaired flexibility, Impaired vision/preception, Pain, Improper body mechanics, Impaired sensation, Decreased mobility, Decreased coordination, Decreased activity tolerance, Decreased range of motion, Decreased strength, Increased muscle spasms, Impaired tone, Impaired UE functional use, Impaired perceived functional ability, Decreased balance, Decreased safety awareness  Visit Diagnosis: Hemiplegia and hemiparesis following cerebral infarction affecting left dominant side (HCC)  Other symptoms and signs involving the nervous system  Muscle weakness (generalized)  Spastic hemiplegia of left dominant side as late effect of cerebral infarction (HCC)  Stiffness of left shoulder, not elsewhere classified    Problem List Patient Active Problem List   Diagnosis Date Noted  . Spastic hemiparesis affecting dominant side ([place]) 01/04/2018  . Hyperglycemia 12/29/2016  . Anxiety and depression 12/29/2016  . Muscle spasticity   . Obesity (BMI 30.0-34.9) 04/08/2016  . HLD (hyperlipidemia) 04/08/2016  . Basal ganglia hemorrhage ([place]) 04/08/2016  . Dysphagia as late effect of cerebrovascular disease   . Migraine with aura and without status migrainosus, not intractable   . Gait disturbance,  post-stroke   . Hemiplegia, post-stroke ([place])   . Aphasia, post-stroke   . Dysarthria, post-stroke   . ICH (intracerebral hemorrhage) (HCC) - R basal ganglia due to hypertensive emergency 04/01/2016  . Upper airway cough syndrome 12/06/2015  . PCP NOTES >>>>>>>>>>>>>>>>>>>>>>>>>>>.. 09/25/2015  . Insomnia 12/28/2012  . Annual physical exam 11/23/2011  . Essential hypertension 10/19/2007    Mariah Milling, OTR/L 09/02/2018, 4:50 PM  [facility] [address] [place], [place], [address] Phone: [phone]  Fax:  [phone]  Name: Abigail Campos MRN: [id number] Date of Birth: 1971-04-06

## 2018-09-06 ENCOUNTER — Other Ambulatory Visit: Payer: Self-pay | Admitting: Internal Medicine

## 2018-09-07 ENCOUNTER — Ambulatory Visit: Payer: 59 | Admitting: Physical Therapy

## 2018-09-07 ENCOUNTER — Encounter: Payer: Self-pay | Admitting: Physical Therapy

## 2018-09-07 ENCOUNTER — Ambulatory Visit: Payer: 59 | Admitting: Occupational Therapy

## 2018-09-07 DIAGNOSIS — I69352 Hemiplegia and hemiparesis following cerebral infarction affecting left dominant side: Secondary | ICD-10-CM

## 2018-09-07 DIAGNOSIS — M6281 Muscle weakness (generalized): Secondary | ICD-10-CM

## 2018-09-07 DIAGNOSIS — R29818 Other symptoms and signs involving the nervous system: Secondary | ICD-10-CM

## 2018-09-07 DIAGNOSIS — R2689 Other abnormalities of gait and mobility: Secondary | ICD-10-CM

## 2018-09-07 NOTE — Therapy (Signed)
[facility] [address] [place], [place], [address] Phone: [phone]   Fax:  [phone]  Physical Therapy Treatment  Patient Details  Name: Abigail Campos MRN: [id number] Date of Birth: Oct 10, 1971 Referring Provider: Dr. Delice Campos   Encounter Date: 09/07/2018  PT End of Session - 09/07/18 1453    Visit Number  6    Number of Visits  12    Date for PT Re-Evaluation  09/28/18    PT Start Time  1448    PT Stop Time  1530    PT Time Calculation (min)  42 min    Activity Tolerance  Patient tolerated treatment well    Behavior During Therapy  [facility] for tasks assessed/performed       Past Medical History:  Diagnosis Date  . Essential hypertension 10/19/2007   03-2010: metoprolol changed to bystolic (was not feeling well on it, no specific allergy or reaction)    . Hyperlipemia   . Hypertension   . Stroke [facility])     Past Surgical History:  Procedure Laterality Date  . ABDOMINAL HYSTERECTOMY  11-14-07   no oophorectomy per surgical report  . CESAREAN SECTION     [id number]  . INGUINAL HERNIA REPAIR     2004    There were no vitals filed for this visit.  Subjective Assessment - 09/07/18 1452    Subjective  doing well - has been compliant with HEP; no falls    Pertinent History  CVA 2017 with L hemiplegia, aphasia, HTN (controlled)    Patient Stated Goals  wants to have more control of L LE and strengthening program    Currently in Pain?  No/denies    Multiple Pain Sites  No                       [facility] Adult PT Treatment/Exercise - 09/07/18 0001      Ambulation/Gait   Ambulation/Gait  Yes    Ambulation/Gait Assistance  5: Supervision    Ambulation/Gait Assistance Details  quad tip - improved upright posture, continued 3 point gait pattern    Ambulation Distance (Feet)  200 Feet    Assistive device  --   quad tip cane   Gait Pattern  Step-to pattern;Step-through pattern;Decreased  stride length;Decreased hip/knee flexion - left;Decreased dorsiflexion - left;Decreased weight shift to left    Ambulation Surface  Level;Indoor      Exercises   Exercises  Knee/Hip      Knee/Hip Exercises: Standing   Functional Squat  10 reps    Functional Squat Limitations  VC/TC for form and weight shifting    Wall Squat  10 reps    Wall Squat Limitations  VC/TC for form and weight shift          Balance Exercises - 09/07/18 1702      Balance Exercises: Standing   Wall Bumps  Hip    Wall Bumps-Hips  Eyes opened;Anterior/posterior;15 reps   working on confidence and control   Other Standing Exercises  seated on green disc with LE on 6" step wokring on hip flexion with upright posturing          PT Short Term Goals - 08/17/18 1657      PT SHORT TERM GOAL #1   Title  patient to be indpendent with initial HEP for improved strength, balance and gait mechanics    Time  4    Period  Weeks  Status  New      PT SHORT TERM GOAL #2   Title  patient to improve TUG to </= 28 seconds to decrease fall risk    Baseline  ~35 seconds with LBQC    Time  4    Period  Weeks    Status  New      PT SHORT TERM GOAL #3   Title  Patient to [employer] ambulation with LRAD over various surfaces for improved functional mobility.         PT Long Term Goals - 08/17/18 1700      PT LONG TERM GOAL #1   Title  patient to be independent with final HEP for improved functional mobility    Time  6    Period  Weeks    Status  New    Target Date  09/28/18      PT LONG TERM GOAL #2   Title  patient to improve gait speed to >/=1.8 ft/sec with LRAD demosntrating reduced fall risk    Baseline  0.78 with [facility] 08/17/18    Time  6    Period  Weeks    Status  New    Target Date  09/28/18      PT LONG TERM GOAL #3   Title  patient to demonstrate improved L LE strength by at least 1 grade demonstrating improved strength needed for mobility    Time  6    Period  Weeks    Status  New     Target Date  09/28/18      PT LONG TERM GOAL #4   Title  patient to perform TUG with LRAD </= 20 seconds for reduced fall risk    Time  6    Period  Weeks    Status  New    Target Date  09/28/18      PT LONG TERM GOAL #5   Title  patient to report plan to join fitness center/community gym for full carryover of PT benefit    Time  6    Period  Weeks    Status  New    Target Date  09/28/18            Plan - 09/07/18 1455    Clinical Impression Statement  Abigail Campos doing well today. Skilled PT session today focusing on isolating hip/thigh musculature with/without gravity with required assist from PT to correctly isolate. Gait with SPC + quad tip appearing much better today with improved upright posture and reduced lateral lean caused by weight of LBQC. Called [facility] again today to ensure Abigail Campos to be at next appointment due to continued misunderstandings by Network engineer at [employer]. Will continue to progress towards goals.     Rehab Potential  Good    PT Frequency  2x / week    PT Duration  6 weeks    PT Treatment/Interventions  ADLs/Self Care Home Management;Electrical Stimulation;Therapeutic exercise;Therapeutic activities;Functional mobility training;Stair training;Gait training;DME Instruction;Balance training;Neuromuscular re-education;Patient/family education;Manual techniques;Taping;Splinting;Passive range of motion    PT Next Visit Plan  LE strengthening, gait with SBQC vs qaud attachment; Abigail Campos from [employer] - AFO need/fitting    Consulted and Agree with Plan of Care  Patient       Patient will benefit from skilled therapeutic intervention in order to improve the following deficits and impairments:  Abnormal gait, Decreased activity tolerance, Decreased balance, Decreased safety awareness, Decreased coordination, Decreased range of motion, Decreased mobility, Difficulty walking, Decreased knowledge of  use of DME, Decreased strength, Decreased endurance  Visit Diagnosis: Hemiplegia  and hemiparesis following cerebral infarction affecting left dominant side (HCC)  Other symptoms and signs involving the nervous system  Other abnormalities of gait and mobility  Muscle weakness (generalized)     Problem List Patient Active Problem List   Diagnosis Date Noted  . Spastic hemiparesis affecting dominant side ([place]) 01/04/2018  . Hyperglycemia 12/29/2016  . Anxiety and depression 12/29/2016  . Muscle spasticity   . Obesity (BMI 30.0-34.9) 04/08/2016  . HLD (hyperlipidemia) 04/08/2016  . Basal ganglia hemorrhage ([place]) 04/08/2016  . Dysphagia as late effect of cerebrovascular disease   . Migraine with aura and without status migrainosus, not intractable   . Gait disturbance, post-stroke   . Hemiplegia, post-stroke ([place])   . Aphasia, post-stroke   . Dysarthria, post-stroke   . ICH (intracerebral hemorrhage) (HCC) - R basal ganglia due to hypertensive emergency 04/01/2016  . Upper airway cough syndrome 12/06/2015  . PCP NOTES >>>>>>>>>>>>>>>>>>>>>>>>>>>.. 09/25/2015  . Insomnia 12/28/2012  . Annual physical exam 11/23/2011  . Essential hypertension 10/19/2007    Lanney Gins, PT, DPT Supplemental Physical Therapist 09/07/18 5:11 PM Pager: [phone] Office: [facility] [address] [place], [place], [address] Phone: [phone]   Fax:  [phone]  Name: Abigail Campos MRN: [id number] Date of Birth: 18-Feb-1971

## 2018-09-07 NOTE — Therapy (Signed)
[facility] [address] [place], [place], [address] Phone: [phone]   Fax:  [phone]  Occupational Therapy Treatment  Patient Details  Name: Abigail Campos MRN: [id number] Date of Birth: [birth year] Referring Provider: Dr. Delice Lesch   Encounter Date: 09/07/2018  OT End of Session - 09/07/18 1538    Visit Number  8    Authorization Type  240 visit limit /  0 used    Authorization - Visit Number  8    Authorization - Number of Visits  240    OT Start Time  5465    OT Stop Time  0354    OT Time Calculation (min)  40 min       Past Medical History:  Diagnosis Date  . Essential hypertension 10/19/2007   03-2010: metoprolol changed to bystolic (was not feeling well on it, no specific allergy or reaction)    . Hyperlipemia   . Hypertension   . Stroke [facility])     Past Surgical History:  Procedure Laterality Date  . ABDOMINAL HYSTERECTOMY  11-14-07   no oophorectomy per surgical report  . CESAREAN SECTION     [id number]  . INGUINAL HERNIA REPAIR     2004    There were no vitals filed for this visit.  Subjective Assessment - 09/07/18 1636    Currently in Pain?  No/denies            Treatment: Pt arrived wearing oval 8 splint, therapist replaced coban. Pt reports decreased overall triggering. Pt performed gentle self stretch reach for the floor, weightbearing through with bilateral UE's physioball in seated rolling ball forwards and backwards for gentle stretch in shoulder flexion and extension, pt performed same exercise with smaller ball in standing at table. Seated edge of mat weight bearing though elbow and hand while performing body on arm movements, min facilitation/ v.c Pt used LUE as a stabilizer/ gross assist to hold pill bottles and containers while opening, pt carried a pillow under left arm. She used LUE as a stabilizer while folding a pillowcase. Anticipate d/c next visit, therapist  discussed with pt.               OT Short Term Goals - 08/01/18 1542      OT SHORT TERM GOAL #1   Title  XXX      OT SHORT TERM GOAL #2   Title  XXX      OT SHORT TERM GOAL #3   Title  XXX      OT SHORT TERM GOAL #4   Title  XXX      OT SHORT TERM GOAL #5   Title  XXX        OT Long Term Goals - 09/07/18 1636      OT LONG TERM GOAL #1   Title  Patient will perform a home stretching program for left upper extremity DUE 927/19    Status  Achieved      OT LONG TERM GOAL #2   Title  Patient will demonstrate knowledge of positioning means to reduce tension in left UE.      Status  Achieved      OT LONG TERM GOAL #3   Title  Patient will doff/don left splint with no more than verbal cueing     Status  Achieved      OT LONG TERM GOAL #4   Title  Patient will incorporate left upper extremity as gross assist  to right upper extremity 25%/time with ADL    Status  On-going      OT LONG TERM GOAL #5   Title  Patient will demonstrate understanding of movements that produce triggering in RIGHT thumb     Status  Achieved            Plan - 09/07/18 1636    Clinical Impression Statement  Patient has made good progress towards goals. Will plan to wrap up this plan of care as expected next visit, and reassess after next injection.      Occupational Profile and client history currently impacting functional performance  Wife, mother, daughter, homemaker - initial stroke occured in 2018    Occupational performance deficits (Please refer to evaluation for details):  ADL's;IADL's;Rest and Sleep    Rehab Potential  Fair    OT Frequency  2x / week    OT Duration  4 weeks    OT Treatment/Interventions  Self-care/ADL training;Electrical Stimulation;Therapeutic exercise;Visual/perceptual remediation/compensation;Patient/family education;Splinting;Neuromuscular education;Moist Heat;Aquatic Therapy;Fluidtherapy;Therapist, nutritional;Therapeutic activities;Balance  training;Cognitive remediation/compensation;Manual Therapy;DME and/or AE instruction;Ultrasound    Plan  address remaining LTG - use of LUE as gross assist, gentle stretching/ weightbearing    Consulted and Agree with Plan of Care  Patient       Patient will benefit from skilled therapeutic intervention in order to improve the following deficits and impairments:  Decreased cognition, Decreased knowledge of use of DME, Impaired flexibility, Impaired vision/preception, Pain, Improper body mechanics, Impaired sensation, Decreased mobility, Decreased coordination, Decreased activity tolerance, Decreased range of motion, Decreased strength, Increased muscle spasms, Impaired tone, Impaired UE functional use, Impaired perceived functional ability, Decreased balance, Decreased safety awareness  Visit Diagnosis: Hemiplegia and hemiparesis following cerebral infarction affecting left dominant side (HCC)  Other symptoms and signs involving the nervous system  Muscle weakness (generalized)  Spastic hemiplegia of left dominant side as late effect of cerebral infarction [facility])    Problem List Patient Active Problem List   Diagnosis Date Noted  . Spastic hemiparesis affecting dominant side ([place]) 01/04/2018  . Hyperglycemia 12/29/2016  . Anxiety and depression 12/29/2016  . Muscle spasticity   . Obesity (BMI 30.0-34.9) 04/08/2016  . HLD (hyperlipidemia) 04/08/2016  . Basal ganglia hemorrhage ([place]) 04/08/2016  . Dysphagia as late effect of cerebrovascular disease   . Migraine with aura and without status migrainosus, not intractable   . Gait disturbance, post-stroke   . Hemiplegia, post-stroke ([place])   . Aphasia, post-stroke   . Dysarthria, post-stroke   . ICH (intracerebral hemorrhage) (HCC) - R basal ganglia due to hypertensive emergency 04/01/2016  . Upper airway cough syndrome 12/06/2015  . PCP NOTES >>>>>>>>>>>>>>>>>>>>>>>>>>>.. 09/25/2015  . Insomnia 12/28/2012  . Annual physical exam  11/23/2011  . Essential hypertension 10/19/2007    Coryn Mosso 09/07/2018, 4:39 PM  [facility] [address] [place], [place], [address] Phone: [phone]   Fax:  [phone]  Name: Abigail Campos MRN: [id number] Date of Birth: [birth year]

## 2018-09-09 ENCOUNTER — Encounter: Payer: Self-pay | Admitting: Physical Therapy

## 2018-09-09 ENCOUNTER — Ambulatory Visit: Payer: 59 | Admitting: Physical Therapy

## 2018-09-09 ENCOUNTER — Ambulatory Visit: Payer: 59 | Admitting: Occupational Therapy

## 2018-09-09 DIAGNOSIS — M6281 Muscle weakness (generalized): Secondary | ICD-10-CM

## 2018-09-09 DIAGNOSIS — R29818 Other symptoms and signs involving the nervous system: Secondary | ICD-10-CM

## 2018-09-09 DIAGNOSIS — R2689 Other abnormalities of gait and mobility: Secondary | ICD-10-CM

## 2018-09-09 DIAGNOSIS — I69352 Hemiplegia and hemiparesis following cerebral infarction affecting left dominant side: Secondary | ICD-10-CM | POA: Diagnosis not present

## 2018-09-09 NOTE — Therapy (Signed)
[facility] [address] [place], [place], [address] Phone: [phone]   Fax:  [phone]  Occupational Therapy Treatment  Patient Details  Name: Abigail Campos MRN: [id number] Date of Birth: [birth year] No data recorded  Encounter Date: 09/09/2018  OT End of Session - 09/09/18 1626    Visit Number  9    Number of Visits  9    Authorization Type  240 visit limit /  0 used    Authorization - Visit Number  9    Authorization - Number of Visits  240    OT Start Time  5176    OT Stop Time  1612    OT Time Calculation (min)  39 min    Activity Tolerance  Patient tolerated treatment well       Past Medical History:  Diagnosis Date  . Essential hypertension 10/19/2007   03-2010: metoprolol changed to bystolic (was not feeling well on it, no specific allergy or reaction)    . Hyperlipemia   . Hypertension   . Stroke [facility])     Past Surgical History:  Procedure Laterality Date  . ABDOMINAL HYSTERECTOMY  11-14-07   no oophorectomy per surgical report  . CESAREAN SECTION     [id number]  . INGUINAL HERNIA REPAIR     2004    There were no vitals filed for this visit.                OT Treatments/Exercises (OP) - 09/09/18 0001      ADLs   LB Dressing  Patient unable to tie shoes, issued elastic laces.      ADL Comments  Discussed with patient that at this point would not recommend further upper extremity botox, as she uses some hand tension functionally to hook carry, and to open containers.  Patient expressed understanding- husband not present.  Patient in agreement with me reaching out to physiatrist prior to her follow up appt.  Also discussed that patient may not need further OT as she is familiar with positioning, stretching, has splint support.               OT Education - 09/09/18 1625    Education Details  no further botox recommended in distal LUE unless functional status changes     Person(s) Educated  Patient    Methods  Explanation    Comprehension  Verbalized understanding       OT Short Term Goals - 08/01/18 1542      OT SHORT TERM GOAL #1   Title  XXX      OT SHORT TERM GOAL #2   Title  XXX      OT SHORT TERM GOAL #3   Title  XXX      OT SHORT TERM GOAL #4   Title  XXX      OT SHORT TERM GOAL #5   Title  XXX        OT Long Term Goals - 09/09/18 1628      OT LONG TERM GOAL #2   Title  Patient will demonstrate knowledge of positioning means to reduce tension in left UE.      Status  Achieved      OT LONG TERM GOAL #3   Title  Patient will doff/don left splint with no more than verbal cueing     Status  Achieved      OT LONG TERM GOAL #4   Title  Patient  will incorporate left upper extremity as gross assist to right upper extremity 25%/time with ADL    Status  Achieved      OT LONG TERM GOAL #5   Title  Patient will demonstrate understanding of movements that produce triggering in RIGHT thumb     Status  Achieved            Plan - 09/09/18 1627    Clinical Impression Statement  Patient has met all goals for OT and is agreeable to discharge as planned.  Do not anticipate further OT warranted at this time.      Occupational Profile and client history currently impacting functional performance  Wife, mother, daughter, homemaker - initial stroke occured in 2018    Occupational performance deficits (Please refer to evaluation for details):  ADL's;IADL's;Rest and Sleep    Rehab Potential  Fair    OT Frequency  2x / week    OT Duration  4 weeks    OT Treatment/Interventions  Self-care/ADL training;Electrical Stimulation;Therapeutic exercise;Visual/perceptual remediation/compensation;Patient/family education;Splinting;Neuromuscular education;Moist Heat;Aquatic Therapy;Fluidtherapy;Therapist, nutritional;Therapeutic activities;Balance training;Cognitive remediation/compensation;Manual Therapy;DME and/or AE instruction;Ultrasound    Plan   discharge OT    Clinical Decision Making  Limited treatment options, no task modification necessary    Consulted and Agree with Plan of Care  Patient       Patient will benefit from skilled therapeutic intervention in order to improve the following deficits and impairments:  Decreased cognition, Decreased knowledge of use of DME, Impaired flexibility, Impaired vision/preception, Pain, Improper body mechanics, Impaired sensation, Decreased mobility, Decreased coordination, Decreased activity tolerance, Decreased range of motion, Decreased strength, Increased muscle spasms, Impaired tone, Impaired UE functional use, Impaired perceived functional ability, Decreased balance, Decreased safety awareness  Visit Diagnosis: Hemiplegia and hemiparesis following cerebral infarction affecting left dominant side (HCC)  Other symptoms and signs involving the nervous system  Muscle weakness (generalized)  Spastic hemiplegia of left dominant side as late effect of cerebral infarction [facility])    Problem List Patient Active Problem List   Diagnosis Date Noted  . Spastic hemiparesis affecting dominant side ([place]) 01/04/2018  . Hyperglycemia 12/29/2016  . Anxiety and depression 12/29/2016  . Muscle spasticity   . Obesity (BMI 30.0-34.9) 04/08/2016  . HLD (hyperlipidemia) 04/08/2016  . Basal ganglia hemorrhage ([place]) 04/08/2016  . Dysphagia as late effect of cerebrovascular disease   . Migraine with aura and without status migrainosus, not intractable   . Gait disturbance, post-stroke   . Hemiplegia, post-stroke ([place])   . Aphasia, post-stroke   . Dysarthria, post-stroke   . ICH (intracerebral hemorrhage) (HCC) - R basal ganglia due to hypertensive emergency 04/01/2016  . Upper airway cough syndrome 12/06/2015  . PCP NOTES >>>>>>>>>>>>>>>>>>>>>>>>>>>.. 09/25/2015  . Insomnia 12/28/2012  . Annual physical exam 11/23/2011  . Essential hypertension 10/19/2007  OCCUPATIONAL THERAPY DISCHARGE  SUMMARY  Visits from Start of Care: 9  Current functional level related to goals / functional outcomes: Patient shows improved use of LUE functionally   Remaining deficits: Spastic hemiplegia, significant somatosensory impairment   Education / Equipment: Splint - left, splint - right, elastic shoelaces, stretching / positioning program Plan: Patient agrees to discharge.  Patient goals were met. Patient is being discharged due to meeting the stated rehab goals.  ?????        Mariah Milling, OTR/L 09/09/2018, 4:29 PM  [facility] [address] [place], [place], [address] Phone: [phone]   Fax:  [phone]  Name: Abigail Campos MRN:  [id number] Date of Birth: 24-Dec-1970

## 2018-09-09 NOTE — Therapy (Signed)
[facility] [address] [place], [place], [address] Phone: [phone]   Fax:  [phone]  Physical Therapy Treatment  Patient Details  Name: Abigail Campos MRN: [id number] Date of Birth: 06-16-1971 Referring Provider (PT): Dr. Delice Lesch   Encounter Date: 09/09/2018  PT End of Session - 09/09/18 1544    Visit Number  7    Number of Visits  12    Date for PT Re-Evaluation  09/28/18    PT Start Time  4585    PT Stop Time  1529    PT Time Calculation (min)  38 min    Activity Tolerance  Patient tolerated treatment well    Behavior During Therapy  [facility] for tasks assessed/performed       Past Medical History:  Diagnosis Date  . Essential hypertension 10/19/2007   03-2010: metoprolol changed to bystolic (was not feeling well on it, no specific allergy or reaction)    . Hyperlipemia   . Hypertension   . Stroke [facility])     Past Surgical History:  Procedure Laterality Date  . ABDOMINAL HYSTERECTOMY  11-14-07   no oophorectomy per surgical report  . CESAREAN SECTION     [id number]  . INGUINAL HERNIA REPAIR     2004    There were no vitals filed for this visit.  Subjective Assessment - 09/09/18 1540    Subjective  doing well - [name] present from [facility] for AFO/brace trials    Pertinent History  CVA 2017 with L hemiplegia, aphasia, HTN (controlled)    Patient Stated Goals  wants to have more control of L LE and strengthening program    Currently in Pain?  No/denies                       [facility] Adult PT Treatment/Exercise - 09/09/18 0001      Ambulation/Gait   Ambulation/Gait  Yes    Ambulation/Gait Assistance  5: Supervision    Ambulation/Gait Assistance Details  quad tip and toe off brace over indoor and outdoor surfaces, up/down inclines/curbs, over grassy surfaces; reduced knee extension in stance phase of gait but improved with toe off brace; up ti Min guard with gait up/down curbs and  grass    Ambulation Distance (Feet)  --   prolonged distances   Assistive device  --   quad tip cane   Gait Pattern  Step-to pattern;Step-through pattern;Decreased stride length;Decreased hip/knee flexion - left;Decreased dorsiflexion - left;Decreased weight shift to left;Left flexed knee in stance    Ambulation Surface  Level;Unlevel;Indoor;Outdoor;Paved;Grass    Gait Comments  cueing for safety and sequencing             PT Education - 09/09/18 1559    Education Details  education on use of toe off device and use of quad cane - large based quad cane much too big and heavy for patient; discussion on resuming PT following obtaining new device for continued gait training; discussion on goals and POC following device    Person(s) Educated  Patient    Methods  Explanation    Comprehension  Verbalized understanding       PT Short Term Goals - 09/09/18 1521      PT SHORT TERM GOAL #1   Title  patient to be indpendent with initial HEP for improved strength, balance and gait mechanics    Time  4    Period  Weeks    Status  Achieved  PT SHORT TERM GOAL #2   Title  patient to improve TUG to </= 28 seconds to decrease fall risk    Baseline  ~35 seconds with LBQC    Time  4    Period  Weeks    Status  Achieved   23 seconds with quad tip cane     PT SHORT TERM GOAL #3   Title  Patient to [employer] I ambulation with LRAD over various surfaces for improved functional mobility.     Baseline  amb with quad tip indoor/outdoor with toe off brace up to min guard with curb navigation and over grassy surfaces    Status  Partially Met        PT Long Term Goals - 08/17/18 1700      PT LONG TERM GOAL #1   Title  patient to be independent with final HEP for improved functional mobility    Time  6    Period  Weeks    Status  New    Target Date  09/28/18      PT LONG TERM GOAL #2   Title  patient to improve gait speed to >/=1.8 ft/sec with LRAD demosntrating reduced fall  risk    Baseline  0.78 with [facility] 08/17/18    Time  6    Period  Weeks    Status  New    Target Date  09/28/18      PT LONG TERM GOAL #3   Title  patient to demonstrate improved L LE strength by at least 1 grade demonstrating improved strength needed for mobility    Time  6    Period  Weeks    Status  New    Target Date  09/28/18      PT LONG TERM GOAL #4   Title  patient to perform TUG with LRAD </= 20 seconds for reduced fall risk    Time  6    Period  Weeks    Status  New    Target Date  09/28/18      PT LONG TERM GOAL #5   Title  patient to report plan to join fitness center/community gym for full carryover of PT benefit    Time  6    Period  Weeks    Status  New    Target Date  09/28/18            Plan - 09/09/18 1547    Clinical Impression Statement  [name] from [facility] present as patient as order for L AFO. Trial of different AFO's with toe off brace wokring most effectively as patient ambulates with slightly crouched posture and wihtout good knee extension in stance phase of gait. PT plan to take break to allow for ordering and receiving brace with PT to resume for gait training following obtaining device. Patient meeting TUG and HEP goal currently with continued progress needed for MOD I gait over various terrains. WIll continue to progress towards goals at upcoming visits.     Rehab Potential  Good    PT Frequency  2x / week    PT Duration  6 weeks    PT Treatment/Interventions  ADLs/Self Care Home Management;Electrical Stimulation;Therapeutic exercise;Therapeutic activities;Functional mobility training;Stair training;Gait training;DME Instruction;Balance training;Neuromuscular re-education;Patient/family education;Manual techniques;Taping;Splinting;Passive range of motion    PT Next Visit Plan  gait training with new toe off brace and quad tip cane    Consulted and Agree with Plan of Care  Patient  Patient will benefit from skilled therapeutic intervention in  order to improve the following deficits and impairments:  Abnormal gait, Decreased activity tolerance, Decreased balance, Decreased safety awareness, Decreased coordination, Decreased range of motion, Decreased mobility, Difficulty walking, Decreased knowledge of use of DME, Decreased strength, Decreased endurance  Visit Diagnosis: Hemiplegia and hemiparesis following cerebral infarction affecting left dominant side (HCC)  Other symptoms and signs involving the nervous system  Other abnormalities of gait and mobility  Muscle weakness (generalized)     Problem List Patient Active Problem List   Diagnosis Date Noted  . Spastic hemiparesis affecting dominant side ([place]) 01/04/2018  . Hyperglycemia 12/29/2016  . Anxiety and depression 12/29/2016  . Muscle spasticity   . Obesity (BMI 30.0-34.9) 04/08/2016  . HLD (hyperlipidemia) 04/08/2016  . Basal ganglia hemorrhage ([place]) 04/08/2016  . Dysphagia as late effect of cerebrovascular disease   . Migraine with aura and without status migrainosus, not intractable   . Gait disturbance, post-stroke   . Hemiplegia, post-stroke ([place])   . Aphasia, post-stroke   . Dysarthria, post-stroke   . ICH (intracerebral hemorrhage) (HCC) - R basal ganglia due to hypertensive emergency 04/01/2016  . Upper airway cough syndrome 12/06/2015  . PCP NOTES >>>>>>>>>>>>>>>>>>>>>>>>>>>.. 09/25/2015  . Insomnia 12/28/2012  . Annual physical exam 11/23/2011  . Essential hypertension 10/19/2007     Lanney Gins, PT, DPT Supplemental Physical Therapist 09/09/18 4:01 PM Pager: [phone] Office: [facility] [address] [place], [place], [address] Phone: [phone]   Fax:  [phone]  Name: KYNNEDY CARRENO MRN: [id number] Date of Birth: 1971-05-06

## 2018-09-14 ENCOUNTER — Encounter: Payer: Self-pay | Admitting: Physical Medicine & Rehabilitation

## 2018-09-14 ENCOUNTER — Encounter: Payer: 59 | Attending: Physical Medicine & Rehabilitation | Admitting: Physical Medicine & Rehabilitation

## 2018-09-14 VITALS — BP 123/86 | HR 80 | Resp 14 | Ht 59.0 in | Wt 153.0 lb

## 2018-09-14 DIAGNOSIS — I69254 Hemiplegia and hemiparesis following other nontraumatic intracranial hemorrhage affecting left non-dominant side: Secondary | ICD-10-CM | POA: Diagnosis present

## 2018-09-14 DIAGNOSIS — I69959 Hemiplegia and hemiparesis following unspecified cerebrovascular disease affecting unspecified side: Secondary | ICD-10-CM | POA: Diagnosis not present

## 2018-09-14 DIAGNOSIS — G43909 Migraine, unspecified, not intractable, without status migrainosus: Secondary | ICD-10-CM | POA: Insufficient documentation

## 2018-09-14 DIAGNOSIS — E785 Hyperlipidemia, unspecified: Secondary | ICD-10-CM | POA: Insufficient documentation

## 2018-09-14 DIAGNOSIS — I619 Nontraumatic intracerebral hemorrhage, unspecified: Secondary | ICD-10-CM

## 2018-09-14 DIAGNOSIS — R252 Cramp and spasm: Secondary | ICD-10-CM | POA: Diagnosis not present

## 2018-09-14 DIAGNOSIS — R58 Hemorrhage, not elsewhere classified: Secondary | ICD-10-CM | POA: Insufficient documentation

## 2018-09-14 DIAGNOSIS — I69154 Hemiplegia and hemiparesis following nontraumatic intracerebral hemorrhage affecting left non-dominant side: Secondary | ICD-10-CM | POA: Diagnosis not present

## 2018-09-14 DIAGNOSIS — R269 Unspecified abnormalities of gait and mobility: Secondary | ICD-10-CM | POA: Diagnosis not present

## 2018-09-14 DIAGNOSIS — I69398 Other sequelae of cerebral infarction: Secondary | ICD-10-CM | POA: Diagnosis not present

## 2018-09-14 DIAGNOSIS — I610 Nontraumatic intracerebral hemorrhage in hemisphere, subcortical: Secondary | ICD-10-CM

## 2018-09-14 DIAGNOSIS — G8114 Spastic hemiplegia affecting left nondominant side: Secondary | ICD-10-CM

## 2018-09-14 DIAGNOSIS — I10 Essential (primary) hypertension: Secondary | ICD-10-CM | POA: Insufficient documentation

## 2018-09-14 NOTE — Progress Notes (Signed)
Subjective:    Patient ID: Abigail Campos, female    DOB: March 02, 1971, 47 y.o.   MRN: [id number]  HPI:  Right-handed female with history of hypertension, migraine headaches presents for follow up after right basal ganglia hemorrhage.   Last clinic visit 08/05/18.  At that time, pt had Dysport injection. Communication exchanged with OT since that time as well.  She continues to take Baclofen. She completed OT and is still in PT. She is wearing WHO at night. Denies falls. She triggering has stopped in her finger.   Pain Inventory Average Pain 0 Pain Right Now 0 My pain is no pain  In the last 24 hours, has pain interfered with the following? General activity 0 Relation with others 0 Enjoyment of life 0 What TIME of day is your pain at its worst? no pain Sleep (in general) Poor  Pain is worse with: no pain Pain improves with: no pain Relief from Meds: 0  Mobility walk with assistance use a cane how many minutes can you walk? 40 ability to climb steps?  yes do you drive?  no Do you have any goals in this area?  no  Function disabled: date disabled .  Neuro/Psych bladder control problems trouble walking  Prior Studies Any changes since last visit?  no  Physicians involved in your care Any changes since last visit?  no   Family History  Problem Relation Age of Onset  . Diabetes Mother   . Hypertension Mother   . Glaucoma Mother   . Colon cancer Mother        mother age 25  . Stroke Paternal Grandfather   . Heart disease Unknown        2 uncles died April 09, 2011 from CAD-CHF  . Stroke Maternal Grandfather   . Diabetes Other        siblings  . Hypertension Other        siblings  . Breast cancer Neg Hx    Social History   Socioeconomic History  . Marital status: Married    Spouse name: Not on file  . Number of children: 2  . Years of education: Not on file  . Highest education level: Not on file  Occupational History  . Occupation: disable d/t  Architectural technologist: [employer]  . Financial resource strain: Not on file  . Food insecurity:    Worry: Not on file    Inability: Not on file  . Transportation needs:    Medical: Not on file    Non-medical: Not on file  Tobacco Use  . Smoking status: Never Smoker  . Smokeless tobacco: Never Used  Substance and Sexual Activity  . Alcohol use: No  . Drug use: No  . Sexual activity: Not on file  Lifestyle  . Physical activity:    Days per week: Not on file    Minutes per session: Not on file  . Stress: Not on file  Relationships  . Social connections:    Talks on phone: Not on file    Gets together: Not on file    Attends religious service: Not on file    Active member of club or organization: Not on file    Attends meetings of clubs or organizations: Not on file    Relationship status: Not on file  Other Topics Concern  . Not on file  Social History Narrative   Used to live independently, stroke 03-2016, d/c from rehab to  his mother's house 04-2016, then moved back w/ her husband as off 04/2018   Daughter finished [employer] [employer] [facility] [employer], one son, both @ home      Past Surgical History:  Procedure Laterality Date  . ABDOMINAL HYSTERECTOMY  11-14-07   no oophorectomy per surgical report  . CESAREAN SECTION     [id number]  . INGUINAL HERNIA REPAIR     2004   Past Medical History:  Diagnosis Date  . Essential hypertension 10/19/2007   03-2010: metoprolol changed to bystolic (was not feeling well on it, no specific allergy or reaction)    . Hyperlipemia   . Hypertension   . Stroke ([place])    BP 123/86 (BP Location: Right Arm, Patient Position: Sitting, Cuff Size: Normal)   Pulse 80   Resp 14   Ht 4\' 11"  (1.499 m)   Wt 153 lb (69.4 kg)   SpO2 97%   BMI 30.90 kg/m   Opioid Risk Score:   Fall Risk Score:  `1  Depression screen PHQ 2/9  Depression screen [place] 2/9 08/05/2018 01/28/2018 08/13/2016 06/10/2016 05/15/2016 09/25/2015 07/17/2015  Decreased Interest 0 0  0 0 0 0 0  Down, Depressed, Hopeless 0 0 0 0 1 0 0  PHQ - 2 Score 0 0 0 0 1 0 0  Altered sleeping - - - - 1 - -  Tired, decreased energy - - - - 3 - -  Change in appetite - - - - 0 - -  Feeling bad or failure about yourself  - - - - 0 - -  Trouble concentrating - - - - 0 - -  Moving slowly or fidgety/restless - - - - 0 - -  Suicidal thoughts - - - - 0 - -  PHQ-9 Score - - - - 5 - -  Some recent data might be hidden   Review of Systems  Musk: Gait abnormality Neurological: Positive for  weakness.  All other systems reviewed and are negative.     Objective:     Physical Exam Constitutional: She appears well-developed.  NAD HENT:  Normocephalic and atraumatic.   Eyes: EOM are normal.  No discharge. Cardiovascular: RRR. No JVD. Respiratory: Effort normal and breath sounds normal. No respiratory distress.   GI: Bowel sounds are normal. She exhibits no distension.  Musculoskeletal: No edema or tenderness in extremities Neurological: She is Alert and oriented 3.  Mild dysarthria  Left facial weakness Motor: RUE/RLE: 5/5 proximal to distal.   LLE: Hip flexion 4/5, knee extension 4/5, ADF/PF 1/5 LUE: Shoulder abduction 2-/5, elbow flex 0/5, elbow ext 0/5, hand grip 0/5.  Modified [place]: Left 0/4 elbow flexors, wrist flexors 1/5, 1+/4 left finger flexors, 2/4 ankle plantar flexors.  Skin: Skin is warm and dry.  Psychiatric: Normal mood and behavior    Assessment & Plan:  Right-handed female with history of hypertension, migraine headaches presents for follow up after right basal ganglia hemorrhage.    1. 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 affecting left nondominant side  Cont meds  Cont Follow up Neurology  Spastic hemiplegia affecting left non-dominant side  Have had lengthy discussion with patient regarding treatment options for spasticity  Cont baclofen 10mg  TID (pt does not wish to increase due to side effects)  Will change Dysport  injection, pt would like to continue at decreased dose:    Left Med biceps: 200 units, 100 units   Left FCR: 200, 100 units   Left FCU: 200, 100 units  Left FDS: 200, 100 units   Left FDP: 200, 100 units    Left Med gastroc: 150, 200 units   Left Lat gastroc: 150, 200 units    Cont PT, completed OT  Cont WHO   2. Gait abnormality  Cont quad cane  Cont Therapies   3. Right trigger thumb  Improved with no locking  Overuse injury as well with positioning  Pt to consider surgery referral in future if necessary

## 2018-09-20 ENCOUNTER — Encounter: Payer: Self-pay | Admitting: Internal Medicine

## 2018-09-20 ENCOUNTER — Ambulatory Visit (INDEPENDENT_AMBULATORY_CARE_PROVIDER_SITE_OTHER): Payer: 59 | Admitting: Internal Medicine

## 2018-09-20 VITALS — BP 132/84 | HR 85 | Temp 98.1°F | Resp 16 | Ht 59.0 in | Wt 163.2 lb

## 2018-09-20 DIAGNOSIS — I10 Essential (primary) hypertension: Secondary | ICD-10-CM

## 2018-09-20 DIAGNOSIS — Z23 Encounter for immunization: Secondary | ICD-10-CM | POA: Diagnosis not present

## 2018-09-20 DIAGNOSIS — K5900 Constipation, unspecified: Secondary | ICD-10-CM

## 2018-09-20 DIAGNOSIS — R739 Hyperglycemia, unspecified: Secondary | ICD-10-CM | POA: Diagnosis not present

## 2018-09-20 LAB — CBC WITH DIFFERENTIAL/PLATELET
Basophils Absolute: 0 10*3/uL (ref 0.0–0.1)
Basophils Relative: 0.3 % (ref 0.0–3.0)
Eosinophils Absolute: 0 10*3/uL (ref 0.0–0.7)
Eosinophils Relative: 0.5 % (ref 0.0–5.0)
HCT: 37.8 % (ref 36.0–46.0)
Hemoglobin: 12.2 g/dL (ref 12.0–15.0)
Lymphocytes Relative: 26.9 % (ref 12.0–46.0)
Lymphs Abs: 1.4 10*3/uL (ref 0.7–4.0)
MCHC: 32.3 g/dL (ref 30.0–36.0)
MCV: 93 fl (ref 78.0–100.0)
Monocytes Absolute: 0.4 10*3/uL (ref 0.1–1.0)
Monocytes Relative: 8.8 % (ref 3.0–12.0)
Neutro Abs: 3.2 10*3/uL (ref 1.4–7.7)
Neutrophils Relative %: 63.5 % (ref 43.0–77.0)
Platelets: 185 10*3/uL (ref 150.0–400.0)
RBC: 4.06 Mil/uL (ref 3.87–5.11)
RDW: 14.3 % (ref 11.5–15.5)
WBC: 5.1 10*3/uL (ref 4.0–10.5)

## 2018-09-20 LAB — HEMOGLOBIN A1C: Hgb A1c MFr Bld: 5.9 % (ref 4.6–6.5)

## 2018-09-20 NOTE — Progress Notes (Signed)
Pre visit review using our clinic review tool, if applicable. No additional management support is needed unless otherwise documented below in the visit note. 

## 2018-09-20 NOTE — Patient Instructions (Addendum)
GO TO THE LAB : Get the blood work     GO TO THE FRONT DESK Schedule your next appointment for a checkup in 4 to 6 months  For constipation: Drink plenty fluids, eat fruits and vegetables Take a over-the-counter fiber supplement such as Metamucil. Use MiraLAX 17 g daily with fluids. If you get constipated, okay to use sporadically a glycerin suppository or Fleet enema If symptoms severe let me know

## 2018-09-20 NOTE — Progress Notes (Signed)
Subjective:    Patient ID: Abigail Campos, female    DOB: [birth year], 47 y.o.   MRN: [id number]  DOS:  09/20/2018 Type of visit - description : Routine follow-up Interval history: Anxiety , depression: Doing better with new medications. Hyperglycemia: Doing great with diet and is trying to stay active. Complaint of constipation since she had a stroke.  Sometimes goes 1 week without a BM. 4 days ago got really constipated, had large, hard BM associated with some streaks of red blood on top of the stools, she thinks related to her hemorrhoids.   Review of Systems Denies nausea or vomiting at this point No suicidal ideas   Past Medical History:  Diagnosis Date  . Essential hypertension 10/19/2007   03-2010: metoprolol changed to bystolic (was not feeling well on it, no specific allergy or reaction)    . Hyperlipemia   . Hypertension   . Stroke [facility])     Past Surgical History:  Procedure Laterality Date  . ABDOMINAL HYSTERECTOMY  11-14-07   no oophorectomy per surgical report  . CESAREAN SECTION     [id number]  . INGUINAL HERNIA REPAIR     2004    Social History   Socioeconomic History  . Marital status: Married    Spouse name: Not on file  . Number of children: 2  . Years of education: Not on file  . Highest education level: Not on file  Occupational History  . 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 Financial resource strain: Not on file  . Food insecurity:    Worry: Not on file    Inability: Not on file  . Transportation needs:    Medical: Not on file    Non-medical: Not on file  Tobacco Use  . Smoking status: Never Smoker  . Smokeless tobacco: Never Used  Substance and Sexual Activity  . Alcohol use: No  . Drug use: No  . Sexual activity: Not on file  Lifestyle  . Physical activity:    Days per week: Not on file    Minutes per session: Not on file  . Stress: Not on file  Relationships  . Social  connections:    Talks on phone: Not on file    Gets together: Not on file    Attends religious service: Not on file    Active member of club or organization: Not on file    Attends meetings of clubs or organizations: Not on file    Relationship status: Not on file  . Intimate partner violence:    Fear of current or ex partner: Not on file    Emotionally abused: Not on file    Physically abused: Not on file    Forced sexual activity: Not on file  Other Topics Concern  . Not on file  Social History Narrative   Used to live independently, stroke 03-2016, d/c from rehab to his mother's house 04-2016, then moved back w/ her husband as off 04/2018   Daughter finished [facility], one son, both @ home         Allergies as of 09/20/2018      Reactions   Bee Venom Anaphylaxis   Peanut-containing Drug Products Anaphylaxis   Hydrocodone Hives   Generalize itching w/o rash   Isovue [iopamidol] Hives, Itching   Pt broke out with one facial hive.  Had itching on her right upper ant and posterior arm w/o evidence of hives.  Pt given water and 25mg  benadryl po.  We observed pt for 30 minutes before d/c.  J. Bohm     Ambien [zolpidem Tartrate] Other (See Comments)   forgetfulness   Aspirin Swelling   Reports blisters w/ ASA but pt reports is ok w/ Motrin, Advil, naproxen   Latex Hives      Medication List        Accurate as of 09/20/18 11:59 PM. Always use your most recent med list.          atorvastatin 20 MG tablet Commonly known as:  LIPITOR Take 1 tablet (20 mg total) by mouth daily at 6 PM.   baclofen 10 MG tablet Commonly known as:  LIORESAL Take 1 tablet (10 mg total) by mouth 3 (three) times daily.   buPROPion 100 MG tablet Commonly known as:  WELLBUTRIN Take 1 tablet (100 mg total) by mouth 2 (two) times daily. For the first week take only 1 tablet daily   clopidogrel 75 MG tablet Commonly known as:  PLAVIX Take by mouth.   escitalopram 20 MG tablet Commonly known  as:  LEXAPRO Take 1 tablet (20 mg total) by mouth daily.   furosemide 20 MG tablet Commonly known as:  LASIX Take 1 tablet (20 mg total) by mouth daily.   lisinopril 2.5 MG tablet Commonly known as:  PRINIVIL,ZESTRIL Take 1 tablet (2.5 mg total) by mouth daily.   nebivolol 5 MG tablet Commonly known as:  BYSTOLIC Take 1 tablet (5 mg total) by mouth every 12 (twelve) hours.   traZODone 50 MG tablet Commonly known as:  DESYREL Take 1 tablet (50 mg total) by mouth at bedtime.          Objective:   Physical Exam BP 132/84 (BP Location: Right Arm, Patient Position: Sitting, Cuff Size: Normal)   Pulse 85   Temp 98.1 F (36.7 C) (Oral)   Resp 16   Ht 4\' 11"  (1.499 m)   Wt 163 lb 4 oz (74 kg)   SpO2 97%   BMI 32.97 kg/m  General:   Well developed, well nourished . NAD.  Neck: No  thyromegaly  Skin: Exposed areas without rash. Not pale. Not jaundice Lungs: CTA B Cardiovascular regular rate and rhythm Abdomen: Soft, nontender.  Nondistended.  Present bowel sounds. Neurologic:  alert & oriented X3.  Speech normal, gait and motor exam at baseline, history of a stroke with left paresis. Psych: Cognition and judgment appear intact.  Cooperative with normal attention span and concentration.  Behavior appropriate. Not depressed or anxious appearing    Assessment & Plan:   Assessment   Prediabetes: A1c 6.0 (April 2017)  HTN Hyperlipidemia Depression, insomnia GERD Stroke 03-2016:  ICH, right basilar ganglia due to HTN emergency, + encephalopathy, + dysphagia, aphasia, dysarthria Residual L spasticity.  CTA neck 03-2016 neg CTA head 10-17 (-) aneurysm Headaches , migraines dx after 2nd pregnancy, (-) CT head 2014 and 2015 Insomnia   Cough, persisting, saw Dr. Melvyn Novas 11-2015  PLAN: Prediabetes: Last A1c 6.3, doing better with lifestyle, check a A1c HWE:[id number] Lasix, lisinopril, Bystolic.  Last BMP satisfactory, check a CBC. Depression, anxiety, insomnia: Since the  last visit, we increased Lexapro to 20 mg and added Wellbutrin.  Doing better.  No change Constipation: Currently takes stool softener, trying to eat more vegetables drinking a lot of water.  Will add Metamucil, MiraLAX daily.  Call if not better.  See AVS Flu shot today RTC 4 to 6 months.

## 2018-09-21 NOTE — Assessment & Plan Note (Signed)
Prediabetes: Last A1c 6.3, doing better with lifestyle, check a A1c YOF:VWAQLRJP Lasix, lisinopril, Bystolic.  Last BMP satisfactory, check a CBC. Depression, anxiety, insomnia: Since the last visit, we increased Lexapro to 20 mg and added Wellbutrin.  Doing better.  No change Constipation: Currently takes stool softener, trying to eat more vegetables drinking a lot of water.  Will add Metamucil, MiraLAX daily.  Call if not better.  See AVS Flu shot today RTC 4 to 6 months.

## 2018-10-11 ENCOUNTER — Ambulatory Visit: Payer: 59 | Attending: Physical Medicine & Rehabilitation | Admitting: Physical Therapy

## 2018-10-11 ENCOUNTER — Encounter: Payer: Self-pay | Admitting: Physical Therapy

## 2018-10-11 DIAGNOSIS — I69352 Hemiplegia and hemiparesis following cerebral infarction affecting left dominant side: Secondary | ICD-10-CM | POA: Insufficient documentation

## 2018-10-11 DIAGNOSIS — R29818 Other symptoms and signs involving the nervous system: Secondary | ICD-10-CM | POA: Insufficient documentation

## 2018-10-11 DIAGNOSIS — R2689 Other abnormalities of gait and mobility: Secondary | ICD-10-CM | POA: Diagnosis present

## 2018-10-11 DIAGNOSIS — M6281 Muscle weakness (generalized): Secondary | ICD-10-CM | POA: Diagnosis present

## 2018-10-11 NOTE — Therapy (Signed)
[facility] [address] Abig[place], [place], [address] Phone: [phone]   Fax:  [phone]  Physical Therapy Treatment  Patient Details  Name: Abigail Campos MRN: [id number] Date of Birth: 1971/09/27 Referring Provider (PT): Dr. Delice Lesch   Encounter Date: 10/11/2018  PT End of Session - 10/11/18 0921    Visit Number  8    Number of Visits  12    Date for PT Re-Evaluation  11/08/18    PT Start Time  0846    PT Stop Time  0914    PT Time Calculation (min)  28 min    Activity Tolerance  Patient tolerated treatment well    Behavior During Therapy  [facility] for tasks assessed/performed       Past Medical History:  Diagnosis Date  . Essential hypertension 10/19/2007   03-2010: metoprolol changed to bystolic (was not feeling well on it, no specific allergy or reaction)    . Hyperlipemia   . Hypertension   . Stroke [facility])     Past Surgical History:  Procedure Laterality Date  . ABDOMINAL HYSTERECTOMY  11-14-07   no oophorectomy per surgical report  . CESAREAN SECTION     [id number]  . INGUINAL HERNIA REPAIR     2004    There were no vitals filed for this visit.  Subjective Assessment - 10/11/18 0844    Subjective  has new toe off brace from [employer], has began driving (~3 weeks); has some foot soreness with new brace - feels like she needs a Dr. Zoe Lan insert.     Pertinent History  CVA 2017 with L hemiplegia, aphasia, HTN (controlled)    Patient Stated Goals  wants to have more control of L LE and strengthening program    Currently in Pain?  No/denies    Multiple Pain Sites  No         [facility] PT Assessment - 10/11/18 0001      Ambulation/Gait   Ambulation/Gait  Yes    Ambulation/Gait Assistance  5: Supervision    Ambulation/Gait Assistance Details  quad tip cane and LBQC; use of toe off brace; improiv    Ambulation Distance (Feet)  300 Feet   total   Assistive device  Large base quad cane   quad tip  cane   Gait Pattern  Step-through pattern    Ambulation Surface  Level;Indoor    Gait velocity  --   LBQC 0.94 ft/sec; quad tip cane 0.93 ft/sec   Gait Comments  very preoccupied about placement of cane when going to sit; 1 LOB with LBQC       Timed Up and Go Test   TUG  Normal TUG    Normal TUG (seconds)  --   LBQC: 32.5; quad tip cane: 31.69                            PT Short Term Goals - 09/09/18 1521      PT SHORT TERM GOAL #1   Title  patient to be indpendent with initial HEP for improved strength, balance and gait mechanics    Time  4    Period  Weeks    Status  Achieved      PT SHORT TERM GOAL #2   Title  patient to improve TUG to </= 28 seconds to decrease fall risk    Baseline  ~35 seconds with LBQC    Time  4    Period  Weeks    Status  Achieved   23 seconds with quad tip cane     PT SHORT TERM GOAL #3   Title  Patient to dmeonstrate Mod I ambulation with LRAD over various surfaces for improved functional mobility.     Baseline  amb with quad tip indoor/outdoor with toe off brace up to min guard with curb navigation and over grassy surfaces    Status  Partially Met        PT Long Term Goals - 10/11/18 0922      PT LONG TERM GOAL #1   Title  patient to be independent with final HEP for improved functional mobility    Time  4    Period  Weeks    Status  On-going    Target Date  11/08/18      PT LONG TERM GOAL #2   Title  patient to improve gait speed to >/=1.8 ft/sec with LRAD demosntrating reduced fall risk    Baseline  0.78 with [facility] 08/17/18; 0.94 with [facility] 10/11/18    Time  4    Period  Weeks    Status  On-going    Target Date  11/08/18      PT LONG TERM GOAL #3   Title  patient to demonstrate improved L LE strength by at least 1 grade demonstrating improved strength needed for mobility    Time  4    Period  Weeks    Status  On-going    Target Date  11/08/18      PT LONG TERM GOAL #4   Title  patient to perform TUG with  LRAD </= 20 seconds for reduced fall risk    Baseline  31.69 with quad tip on 10/29 - increased time likely due to new brace     Time  4    Period  Weeks    Status  On-going    Target Date  11/08/18      PT LONG TERM GOAL #5   Title  patient to report plan to join fitness center/community gym for full carryover of PT benefit    Time  4    Period  Weeks    Status  Partially Met    Target Date  11/08/18            Plan - 10/11/18 7673    Clinical Impression Statement  Patient presenting to [place] today after short break to allow for new brace. Patient wearing new brace into clinic with [facility]. Noted improved foot clearance with toe off brace, but does continue to demonstrate a crouched posture with mobility. Patient reporting dizziness in session today requiring seated rest break. BP fluctuating between 130/80 up to 146/99. Patient reporting axiety this morning with driving. PT ending session in order for patient to get home safely as BP was reduced to WNL at conclusion of session. Education to take BP at home and notify MD with high readings with good verbal understanding. [place] felt safe to return home. Will plan to extens current POC dates to allow for gait training with new device and to continue to progress towards LTG's.     Rehab Potential  Good    PT Frequency  2x / week    PT Duration  6 weeks    PT Treatment/Interventions  ADLs/Self Care Home Management;Electrical Stimulation;Therapeutic exercise;Therapeutic activities;Functional mobility training;Stair training;Gait training;DME Instruction;Balance training;Neuromuscular re-education;Patient/family education;Manual techniques;Taping;Splinting;Passive range of motion  PT Next Visit Plan  gait training with new toe off brace and quad tip cane    Consulted and Agree with Plan of Care  Patient       Patient will benefit from skilled therapeutic intervention in order to improve the following deficits and impairments:  Abnormal  gait, Decreased activity tolerance, Decreased balance, Decreased safety awareness, Decreased coordination, Decreased range of motion, Decreased mobility, Difficulty walking, Decreased knowledge of use of DME, Decreased strength, Decreased endurance  Visit Diagnosis: Hemiplegia and hemiparesis following cerebral infarction affecting left dominant side (HCC)  Other symptoms and signs involving the nervous system  Other abnormalities of gait and mobility  Muscle weakness (generalized)     Problem List Patient Active Problem List   Diagnosis Date Noted  . Spastic hemiparesis affecting dominant side ([place]) 01/04/2018  . Hyperglycemia 12/29/2016  . Anxiety and depression 12/29/2016  . Muscle spasticity   . Obesity (BMI 30.0-34.9) 04/08/2016  . HLD (hyperlipidemia) 04/08/2016  . Basal ganglia hemorrhage ([place]) 04/08/2016  . Dysphagia as late effect of cerebrovascular disease   . Migraine with aura and without status migrainosus, not intractable   . Gait disturbance, post-stroke   . Hemiplegia, post-stroke ([place])   . Aphasia, post-stroke   . Dysarthria, post-stroke   . ICH (intracerebral hemorrhage) (HCC) - R basal ganglia due to hypertensive emergency 04/01/2016  . Upper airway cough syndrome 12/06/2015  . PCP NOTES >>>>>>>>>>>>>>>>>>>>>>>>>>>.. 09/25/2015  . Insomnia 12/28/2012  . Annual physical exam 11/23/2011  . Essential hypertension 10/19/2007    Lanney Gins, PT, DPT Supplemental Physical Therapist 10/11/18 1:23 PM Pager: [phone] Office: [place] [address] [place], [place], [address] Phone: [phone]   Fax:  [phone]  Name: DESA RECH MRN: [id number] Date of Birth: 1971/09/23

## 2018-10-19 ENCOUNTER — Ambulatory Visit: Payer: 59 | Attending: Physical Medicine & Rehabilitation | Admitting: Physical Therapy

## 2018-10-19 ENCOUNTER — Encounter: Payer: Self-pay | Admitting: Physical Therapy

## 2018-10-19 DIAGNOSIS — R29818 Other symptoms and signs involving the nervous system: Secondary | ICD-10-CM | POA: Insufficient documentation

## 2018-10-19 DIAGNOSIS — I69352 Hemiplegia and hemiparesis following cerebral infarction affecting left dominant side: Secondary | ICD-10-CM | POA: Insufficient documentation

## 2018-10-19 DIAGNOSIS — R2689 Other abnormalities of gait and mobility: Secondary | ICD-10-CM | POA: Insufficient documentation

## 2018-10-19 DIAGNOSIS — M6281 Muscle weakness (generalized): Secondary | ICD-10-CM

## 2018-10-19 NOTE — Therapy (Signed)
[facility] [address] [place], [place], [address] Phone: [phone]   Fax:  [phone]  Physical Therapy Treatment  Patient Details  Name: Abigail Campos MRN: [id number] Date of Birth: [birth year] Referring Provider (PT): Dr. Delice Lesch   Encounter Date: 10/19/2018  PT End of Session - 10/19/18 1218    Visit Number  9    Number of Visits  12    Date for PT Re-Evaluation  11/08/18    PT Start Time  1150    PT Stop Time  1230    PT Time Calculation (min)  40 min    Activity Tolerance  Patient tolerated treatment well    Behavior During Therapy  [facility] for tasks assessed/performed       Past Medical History:  Diagnosis Date  . Essential hypertension 10/19/2007   03-2010: metoprolol changed to bystolic (was not feeling well on it, no specific allergy or reaction)    . Hyperlipemia   . Hypertension   . Stroke [facility])     Past Surgical History:  Procedure Laterality Date  . ABDOMINAL HYSTERECTOMY  11-14-07   no oophorectomy per surgical report  . CESAREAN SECTION     [id number]  . INGUINAL HERNIA REPAIR     2004    There were no vitals filed for this visit.  Subjective Assessment - 10/19/18 1217    Subjective  continues to walk with new brace; has acquired [facility] with quad tip - feels uneasy with gait as she is used to [facility]    Pertinent History  CVA 2017 with L hemiplegia, aphasia, HTN (controlled)    Patient Stated Goals  wants to have more control of L LE and strengthening program    Currently in Pain?  No/denies    Multiple Pain Sites  No                       [facility] Adult PT Treatment/Exercise - 10/19/18 0001      Ambulation/Gait   Ambulation/Gait  Yes    Ambulation/Gait Assistance  5: Supervision;4: Min guard    Ambulation/Gait Assistance Details  toe off brace and quad tip cane    Ambulation Distance (Feet)  1000 Feet    Assistive device  --   quad tip cane   Gait Pattern  Step-to  pattern;Step-through pattern;Decreased step length - left;Decreased hip/knee flexion - left;Decreased weight shift to left    Ambulation Surface  Level;Unlevel;Indoor;Outdoor;Paved;Gravel    Stairs  Yes    Stairs Assistance  4: Min guard    Stair Management Technique  One rail Right;Step to pattern    Number of Stairs  4   2 sets   Height of Stairs  6    Gait Comments  gait training on indoor and outdoor surfaces with focus on increase R step length and L pelvic rotation. Ambulating up/down inclines and ramps, as well as over gravel and mulch to simulate communit navigation; side stepping to the L and retro gait in // bars x 4 reps focusing on foot placement.                PT Short Term Goals - 09/09/18 1521      PT SHORT TERM GOAL #1   Title  patient to be indpendent with initial HEP for improved strength, balance and gait mechanics    Time  4    Period  Weeks    Status  Achieved  PT SHORT TERM GOAL #2   Title  patient to improve TUG to </= 28 seconds to decrease fall risk    Baseline  ~35 seconds with [facility]    Time  4    Period  Weeks    Status  Achieved   23 seconds with quad tip cane     PT SHORT TERM GOAL #3   Title  Patient to [employer] ambulation with LRAD over various surfaces for improved functional mobility.     Baseline  amb with quad tip indoor/outdoor with toe off brace up to min guard with curb navigation and over grassy surfaces    Status  Partially Met        PT Long Term Goals - 10/11/18 0922      PT LONG TERM GOAL #1   Title  patient to be independent with final HEP for improved functional mobility    Time  4    Period  Weeks    Status  On-going    Target Date  11/08/18      PT LONG TERM GOAL #2   Title  patient to improve gait speed to >/=1.8 ft/sec with LRAD demosntrating reduced fall risk    Baseline  0.78 with [facility] 08/17/18; 0.94 with [facility] 10/11/18    Time  4    Period  Weeks    Status  On-going    Target Date  11/08/18       PT LONG TERM GOAL #3   Title  patient to demonstrate improved L LE strength by at least 1 grade demonstrating improved strength needed for mobility    Time  4    Period  Weeks    Status  On-going    Target Date  11/08/18      PT LONG TERM GOAL #4   Title  patient to perform TUG with LRAD </= 20 seconds for reduced fall risk    Baseline  31.69 with quad tip on 10/29 - increased time likely due to new brace     Time  4    Period  Weeks    Status  On-going    Target Date  11/08/18      PT LONG TERM GOAL #5   Title  patient to report plan to join fitness center/community gym for full carryover of PT benefit    Time  4    Period  Weeks    Status  Partially Met    Target Date  11/08/18            Plan - 10/19/18 1218    Clinical Impression Statement  Patient seen in clinic today to continue to progress gait training with toe off brace and quad tip cane. Large portion of session spent outside on pave and uneven surfaces to simulate community navigation. Patient requires cueing for larger R step length as well as pelvic rotation as she prefers her L LE to external rotate with all mobility. Making good progress twoards goals.     Rehab Potential  Good    PT Frequency  2x / week    PT Duration  6 weeks    PT Treatment/Interventions  ADLs/Self Care Home Management;Electrical Stimulation;Therapeutic exercise;Therapeutic activities;Functional mobility training;Stair training;Gait training;DME Instruction;Balance training;Neuromuscular re-education;Patient/family education;Manual techniques;Taping;Splinting;Passive range of motion    PT Next Visit Plan  gait training with new toe off brace and quad tip cane    Consulted and Agree with Plan of Care  Patient  Patient will benefit from skilled therapeutic intervention in order to improve the following deficits and impairments:  Abnormal gait, Decreased activity tolerance, Decreased balance, Decreased safety awareness, Decreased  coordination, Decreased range of motion, Decreased mobility, Difficulty walking, Decreased knowledge of use of DME, Decreased strength, Decreased endurance  Visit Diagnosis: Hemiplegia and hemiparesis following cerebral infarction affecting left dominant side (HCC)  Other symptoms and signs involving the nervous system  Other abnormalities of gait and mobility  Muscle weakness (generalized)     Problem List Patient Active Problem List   Diagnosis Date Noted  . Spastic hemiparesis affecting dominant side ([place]) 01/04/2018  . Hyperglycemia 12/29/2016  . Anxiety and depression 12/29/2016  . Muscle spasticity   . Obesity (BMI 30.0-34.9) 04/08/2016  . HLD (hyperlipidemia) 04/08/2016  . Basal ganglia hemorrhage ([facility]) 04/08/2016  . Dysphagia as late effect of cerebrovascular disease   . Migraine with aura and without status migrainosus, not intractable   . Gait disturbance, post-stroke   . Hemiplegia, post-stroke ([place])   . Aphasia, post-stroke   . Dysarthria, post-stroke   . ICH (intracerebral hemorrhage) (HCC) - R basal ganglia due to hypertensive emergency 04/01/2016  . Upper airway cough syndrome 12/06/2015  . PCP NOTES >>>>>>>>>>>>>>>>>>>>>>>>>>>.. 09/25/2015  . Insomnia 12/28/2012  . Annual physical exam 11/23/2011  . Essential hypertension 10/19/2007    Lanney Gins, PT, DPT Supplemental Physical Therapist 10/19/18 1:04 PM Pager: [phone] Office: [facility] [address] [place], [place], [address] Phone: [phone]   Fax:  [phone]  Name: XIAO GRAUL MRN: [id number] Date of Birth: 09-09-1971

## 2018-10-21 ENCOUNTER — Ambulatory Visit: Payer: 59 | Admitting: Physical Therapy

## 2018-10-21 ENCOUNTER — Encounter: Payer: Self-pay | Admitting: Physical Therapy

## 2018-10-21 DIAGNOSIS — R29818 Other symptoms and signs involving the nervous system: Secondary | ICD-10-CM

## 2018-10-21 DIAGNOSIS — M6281 Muscle weakness (generalized): Secondary | ICD-10-CM

## 2018-10-21 DIAGNOSIS — I69352 Hemiplegia and hemiparesis following cerebral infarction affecting left dominant side: Secondary | ICD-10-CM

## 2018-10-21 DIAGNOSIS — R2689 Other abnormalities of gait and mobility: Secondary | ICD-10-CM

## 2018-10-21 NOTE — Therapy (Signed)
[facility] [address] [place], [place], [address] Phone: [phone]   Fax:  [phone]  Physical Therapy Treatment  Patient Details  Name: Abigail Campos MRN: [id number] Date of Birth: 1971-04-05 Referring Provider (PT): Dr. Delice Lesch   Encounter Date: 10/21/2018  PT End of Session - 10/21/18 1516    Visit Number  10    Number of Visits  12    Date for PT Re-Evaluation  11/08/18    PT Start Time  1446    PT Stop Time  1527    PT Time Calculation (min)  41 min    Activity Tolerance  Patient tolerated treatment well    Behavior During Therapy  [facility] for tasks assessed/performed       Past Medical History:  Diagnosis Date  . Essential hypertension 10/19/2007   03-2010: metoprolol changed to bystolic (was not feeling well on it, no specific allergy or reaction)    . Hyperlipemia   . Hypertension   . Stroke [facility])     Past Surgical History:  Procedure Laterality Date  . ABDOMINAL HYSTERECTOMY  11-14-07   no oophorectomy per surgical report  . CESAREAN SECTION     [id number]  . INGUINAL HERNIA REPAIR     2004    There were no vitals filed for this visit.  Subjective Assessment - 10/21/18 1452    Subjective  wearing brace in different pair of tennis shoes today - noticing heel does not strike with this pair of shoes    Pertinent History  CVA 2017 with L hemiplegia, aphasia, HTN (controlled)    Patient Stated Goals  wants to have more control of L LE and strengthening program    Currently in Pain?  No/denies    Multiple Pain Sites  No                       [facility] Adult PT Treatment/Exercise - 10/21/18 0001      High Level Balance   High Level Balance Comments  obstacle course with hurdles and cones focusing on foot clearance as well as foot positioning as patient tends to externally roatate. Needs cueing and some physical assist for forward and lateral clearing of foot at hurdles. gait  on blue mat - forward and lateral stepping with focus on placement of quad tip cane and foot clearance.                PT Short Term Goals - 09/09/18 1521      PT SHORT TERM GOAL #1   Title  patient to be indpendent with initial HEP for improved strength, balance and gait mechanics    Time  4    Period  Weeks    Status  Achieved      PT SHORT TERM GOAL #2   Title  patient to improve TUG to </= 28 seconds to decrease fall risk    Baseline  ~35 seconds with [facility]    Time  4    Period  Weeks    Status  Achieved   23 seconds with quad tip cane     PT SHORT TERM GOAL #3   Title  Patient to [employer] ambulation with LRAD over various surfaces for improved functional mobility.     Baseline  amb with quad tip indoor/outdoor with toe off brace up to min guard with curb navigation and over grassy surfaces    Status  Partially Met        PT Long Term Goals - 10/11/18 7371      PT LONG TERM GOAL #1   Title  patient to be independent with final HEP for improved functional mobility    Time  4    Period  Weeks    Status  On-going    Target Date  11/08/18      PT LONG TERM GOAL #2   Title  patient to improve gait speed to >/=1.8 ft/sec with LRAD demosntrating reduced fall risk    Baseline  0.78 with [facility] 08/17/18; 0.94 with [facility] 10/11/18    Time  4    Period  Weeks    Status  On-going    Target Date  11/08/18      PT LONG TERM GOAL #3   Title  patient to demonstrate improved L LE strength by at least 1 grade demonstrating improved strength needed for mobility    Time  4    Period  Weeks    Status  On-going    Target Date  11/08/18      PT LONG TERM GOAL #4   Title  patient to perform TUG with LRAD </= 20 seconds for reduced fall risk    Baseline  31.69 with quad tip on 10/29 - increased time likely due to new brace     Time  4    Period  Weeks    Status  On-going    Target Date  11/08/18      PT LONG TERM GOAL #5   Title  patient to report plan to join  fitness center/community gym for full carryover of PT benefit    Time  4    Period  Weeks    Status  Partially Met    Target Date  11/08/18            Plan - 10/21/18 1629    Clinical Impression Statement  Patient wearing different tennis shoes today with noted poor heel strike with these shoes as well as tendency to ambulate up on toes. Session focusing on stepping over and around obstacles to simulate community navigation. Does require some cueing for good hip/knee flexion for clearance. Educated patient to be more aware of hip and foot positioning with giat for a more normal gait pattern with reduced compensations.     Rehab Potential  Good    PT Frequency  2x / week    PT Duration  6 weeks    PT Treatment/Interventions  ADLs/Self Care Home Management;Electrical Stimulation;Therapeutic exercise;Therapeutic activities;Functional mobility training;Stair training;Gait training;DME Instruction;Balance training;Neuromuscular re-education;Patient/family education;Manual techniques;Taping;Splinting;Passive range of motion    PT Next Visit Plan  gait training with new toe off brace and quad tip cane    Consulted and Agree with Plan of Care  Patient       Patient will benefit from skilled therapeutic intervention in order to improve the following deficits and impairments:  Abnormal gait, Decreased activity tolerance, Decreased balance, Decreased safety awareness, Decreased coordination, Decreased range of motion, Decreased mobility, Difficulty walking, Decreased knowledge of use of DME, Decreased strength, Decreased endurance  Visit Diagnosis: Hemiplegia and hemiparesis following cerebral infarction affecting left dominant side (HCC)  Other symptoms and signs involving the nervous system  Other abnormalities of gait and mobility  Muscle weakness (generalized)     Problem List Patient Active Problem List   Diagnosis Date Noted  . Spastic hemiparesis affecting dominant side ([place])  01/04/2018  .  Hyperglycemia 12/29/2016  . Anxiety and depression 12/29/2016  . Muscle spasticity   . Obesity (BMI 30.0-34.9) 04/08/2016  . HLD (hyperlipidemia) 04/08/2016  . Basal ganglia hemorrhage ([place]) 04/08/2016  . Dysphagia as late effect of cerebrovascular disease   . Migraine with aura and without status migrainosus, not intractable   . Gait disturbance, post-stroke   . Hemiplegia, post-stroke ([place])   . Aphasia, post-stroke   . Dysarthria, post-stroke   . ICH (intracerebral hemorrhage) (HCC) - R basal ganglia due to hypertensive emergency 04/01/2016  . Upper airway cough syndrome 12/06/2015  . PCP NOTES >>>>>>>>>>>>>>>>>>>>>>>>>>>.. 09/25/2015  . Insomnia 12/28/2012  . Annual physical exam 11/23/2011  . Essential hypertension 10/19/2007    Lanney Gins, PT, DPT Supplemental Physical Therapist 10/21/18 4:39 PM Pager: [phone] Office: [facility] [address] [place], [place], [address] Phone: [phone]   Fax:  [phone]  Name: Abigail Campos MRN: [id number] Date of Birth: March 02, 1971

## 2018-10-26 ENCOUNTER — Ambulatory Visit: Payer: 59 | Admitting: Physical Therapy

## 2018-10-28 ENCOUNTER — Ambulatory Visit: Payer: 59 | Admitting: Physical Therapy

## 2018-10-28 ENCOUNTER — Encounter: Payer: Self-pay | Admitting: Physical Therapy

## 2018-10-28 DIAGNOSIS — R2689 Other abnormalities of gait and mobility: Secondary | ICD-10-CM

## 2018-10-28 DIAGNOSIS — I69352 Hemiplegia and hemiparesis following cerebral infarction affecting left dominant side: Secondary | ICD-10-CM

## 2018-10-28 DIAGNOSIS — M6281 Muscle weakness (generalized): Secondary | ICD-10-CM

## 2018-10-28 DIAGNOSIS — R29818 Other symptoms and signs involving the nervous system: Secondary | ICD-10-CM

## 2018-10-28 NOTE — Therapy (Addendum)
[facility] [address] [place], [place], [address] Phone: [phone]   Fax:  [phone]  Physical Therapy Treatment  Patient Details  Name: Abigail Campos MRN: [id number] Date of Birth: October 14, 1971 Referring Provider (PT): Dr. Delice Lesch   Encounter Date: 10/28/2018  PT End of Session - 10/28/18 1326    Visit Number  11    Number of Visits  12    Date for PT Re-Evaluation  11/08/18    PT Start Time  0388    PT Stop Time  1401    PT Time Calculation (min)  40 min    Activity Tolerance  Patient tolerated treatment well    Behavior During Therapy  [facility] for tasks assessed/performed       Past Medical History:  Diagnosis Date  . Essential hypertension 10/19/2007   03-2010: metoprolol changed to bystolic (was not feeling well on it, no specific allergy or reaction)    . Hyperlipemia   . Hypertension   . Stroke [facility])     Past Surgical History:  Procedure Laterality Date  . ABDOMINAL HYSTERECTOMY  11-14-07   no oophorectomy per surgical report  . CESAREAN SECTION     [id number]  . INGUINAL HERNIA REPAIR     2004    There were no vitals filed for this visit.  Subjective Assessment - 10/28/18 1325    Subjective  doing well - last PT session; feels like her cane needs more weight    Pertinent History  CVA 2017 with L hemiplegia, aphasia, HTN (controlled)    Patient Stated Goals  wants to have more control of L LE and strengthening program    Currently in Pain?  No/denies    Multiple Pain Sites  No         [facility] PT Assessment - 10/28/18 0001      Strength   Left Hip Flexion  3+/5    Left Hip Extension  2+/5    Left Hip ABduction  2+/5    Left Knee Flexion  2+/5    Left Knee Extension  4-/5      Ambulation/Gait   Ambulation/Gait  Yes    Ambulation/Gait Assistance  4: Min guard    Ambulation/Gait Assistance Details  toe off brace and quad tip cane     Ambulation Distance (Feet)  100 Feet    Gait  Pattern  Step-to pattern;Step-through pattern;Decreased step length - left;Decreased hip/knee flexion - left;Decreased weight shift to left    Ambulation Surface  Level;Indoor    Gait velocity  1.85 ft/sec      Timed Up and Go Test   TUG  Normal TUG    Normal TUG (seconds)  19.69                   [facility] Adult PT Treatment/Exercise - 10/28/18 0001      Exercises   Exercises  Other Exercises    Other Exercises   step up to 4" step with 1 UE support - wokring on hip/knee flexion and stepping up with appropriate balance               PT Short Term Goals - 09/09/18 1521      PT SHORT TERM GOAL #1   Title  patient to be indpendent with initial HEP for improved strength, balance and gait mechanics    Time  4    Period  Weeks    Status  Achieved  PT SHORT TERM GOAL #2   Title  patient to improve TUG to </= 28 seconds to decrease fall risk    Baseline  ~35 seconds with [facility]    Time  4    Period  Weeks    Status  Achieved   23 seconds with quad tip cane     PT SHORT TERM GOAL #3   Title  Patient to [employer] ambulation with LRAD over various surfaces for improved functional mobility.     Baseline  amb with quad tip indoor/outdoor with toe off brace up to min guard with curb navigation and over grassy surfaces    Status  Partially Met        PT Long Term Goals - 10/28/18 1326      PT LONG TERM GOAL #1   Title  patient to be independent with final HEP for improved functional mobility    Time  4    Period  Weeks    Status  Achieved      PT LONG TERM GOAL #2   Title  patient to improve gait speed to >/=1.8 ft/sec with LRAD demosntrating reduced fall risk    Baseline  0.78 with [facility] 08/17/18; 0.94 with [facility] 10/11/18; 1.85 ft/sec on 11/15    Time  4    Period  Weeks    Status  Achieved      PT LONG TERM GOAL #3   Title  patient to demonstrate improved L LE strength by at least 1 grade demonstrating improved strength needed for mobility    Time   4    Period  Weeks    Status  Partially Met      PT LONG TERM GOAL #4   Title  patient to perform TUG with LRAD </= 20 seconds for reduced fall risk    Baseline  31.69 with quad tip on 10/29 - increased time likely due to new brace ; 19.69 on 11/15    Time  4    Period  Weeks    Status  Achieved      PT LONG TERM GOAL #5   Title  patient to report plan to join fitness center/community gym for full carryover of PT benefit    Time  4    Period  Weeks    Status  Achieved            Plan - 10/28/18 1626    Clinical Impression Statement  Patient presenting to PT for last session. Patient meeting or partially meeting all goals with strength continuing to demonstrate most deficits. Patient ambualting at 2x the speed at initial eval, as well as demonstrating improvements in general balance and mobility. Patient d/c form PT this date with patient welcome to return in the future with any other concerns.     Rehab Potential  Good    PT Frequency  2x / week    PT Duration  6 weeks    PT Treatment/Interventions  ADLs/Self Care Home Management;Electrical Stimulation;Therapeutic exercise;Therapeutic activities;Functional mobility training;Stair training;Gait training;DME Instruction;Balance training;Neuromuscular re-education;Patient/family education;Manual techniques;Taping;Splinting;Passive range of motion    PT Next Visit Plan  d/c on this date    Consulted and Agree with Plan of Care  Patient       Patient will benefit from skilled therapeutic intervention in order to improve the following deficits and impairments:  Abnormal gait, Decreased activity tolerance, Decreased balance, Decreased safety awareness, Decreased coordination, Decreased range of motion, Decreased mobility, Difficulty  walking, Decreased knowledge of use of DME, Decreased strength, Decreased endurance  Visit Diagnosis: Hemiplegia and hemiparesis following cerebral infarction affecting left dominant side (HCC)  Other  symptoms and signs involving the nervous system  Other abnormalities of gait and mobility  Muscle weakness (generalized)     Problem List Patient Active Problem List   Diagnosis Date Noted  . Spastic hemiparesis affecting dominant side ([place]) 01/04/2018  . Hyperglycemia 12/29/2016  . Anxiety and depression 12/29/2016  . Muscle spasticity   . Obesity (BMI 30.0-34.9) 04/08/2016  . HLD (hyperlipidemia) 04/08/2016  . Basal ganglia hemorrhage ([place]) 04/08/2016  . Dysphagia as late effect of cerebrovascular disease   . Migraine with aura and without status migrainosus, not intractable   . Gait disturbance, post-stroke   . Hemiplegia, post-stroke ([place])   . Aphasia, post-stroke   . Dysarthria, post-stroke   . ICH (intracerebral hemorrhage) (HCC) - R basal ganglia due to hypertensive emergency 04/01/2016  . Upper airway cough syndrome 12/06/2015  . PCP NOTES >>>>>>>>>>>>>>>>>>>>>>>>>>>.. 09/25/2015  . Insomnia 12/28/2012  . Annual physical exam 11/23/2011  . Essential hypertension 10/19/2007    Lanney Gins, PT, DPT Supplemental Physical Therapist 10/28/18 4:37 PM Pager: [phone] Office: [facility] [address] [place], [place], [address] Phone: [phone]   Fax:  [phone]  Name: Abigail Campos MRN: [id number] Date of Birth: 1971/12/07  PHYSICAL THERAPY DISCHARGE SUMMARY  Visits from Start of Care: 11  Current functional level related to goals / functional outcomes: Meeting or partially meeting all goals; improved gait mechanics with new bracing; most deficits continue due to prior CVA; good improvements in balance and mobility   Remaining deficits: Strength, gait mechanics   Education / Equipment: HEP  Plan: Patient agrees to discharge.  Patient goals were partially met. Patient is being discharged due to being pleased with the current functional level.  ?????      Lanney Gins, PT, DPT Supplemental Physical Therapist 01/25/19 1:40 PM Pager: [phone] Office: [phone]

## 2018-11-01 ENCOUNTER — Ambulatory Visit
Admission: RE | Admit: 2018-11-01 | Discharge: 2018-11-01 | Disposition: A | Discharge status: Home / Self Care | Payer: 59 | Source: Ambulatory Visit | Attending: Internal Medicine | Admitting: Internal Medicine

## 2018-11-01 DIAGNOSIS — R921 Mammographic calcification found on diagnostic imaging of breast: Secondary | ICD-10-CM

## 2018-11-01 IMAGING — MG DIGITAL DIAGNOSTIC UNILATERAL LEFT MAMMOGRAM WITH TOMO AND CAD
8 series · 8 of 16 positions shown · non-contrast
Comparison: Previous exam(s).

CLINICAL DATA: Screening recall for left breast calcifications.

EXAM:
DIGITAL DIAGNOSTIC UNILATERAL LEFT MAMMOGRAM WITH CAD AND TOMO

[L ML (1 of 2)]
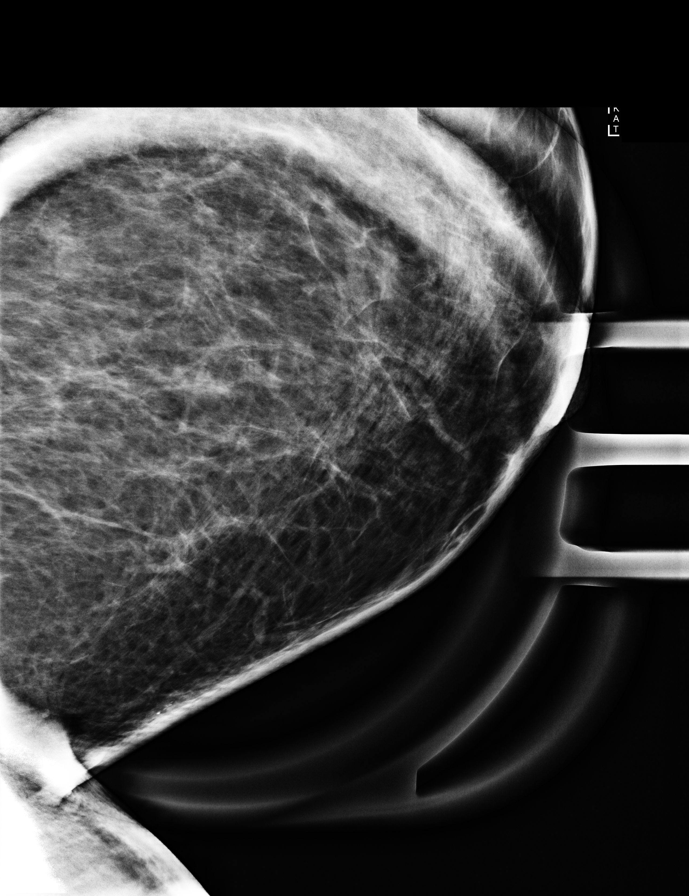

[L ML (2 of 2)]
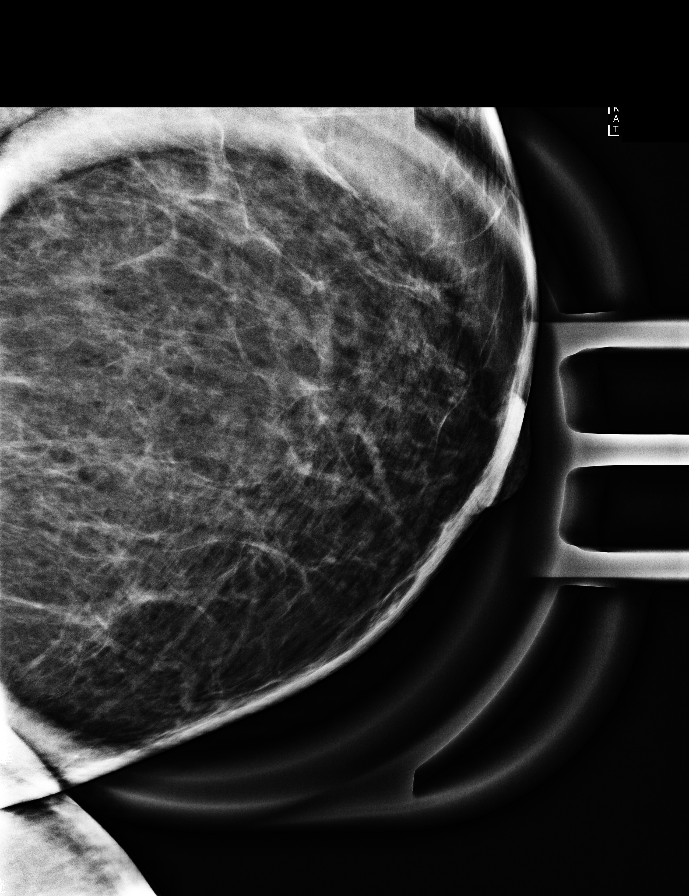

[L CC (1 of 2)]
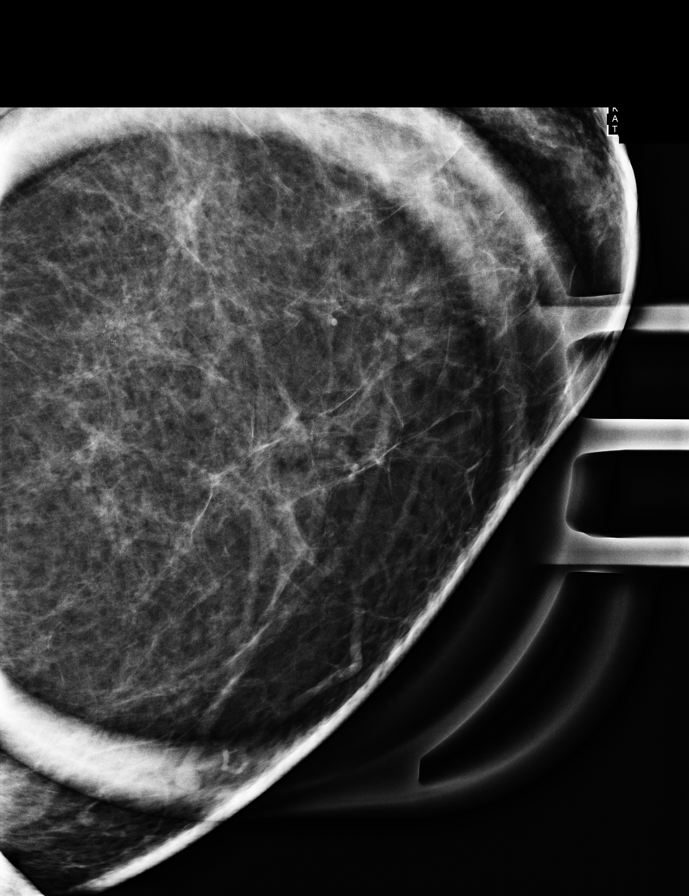

[L CC (2 of 2)]
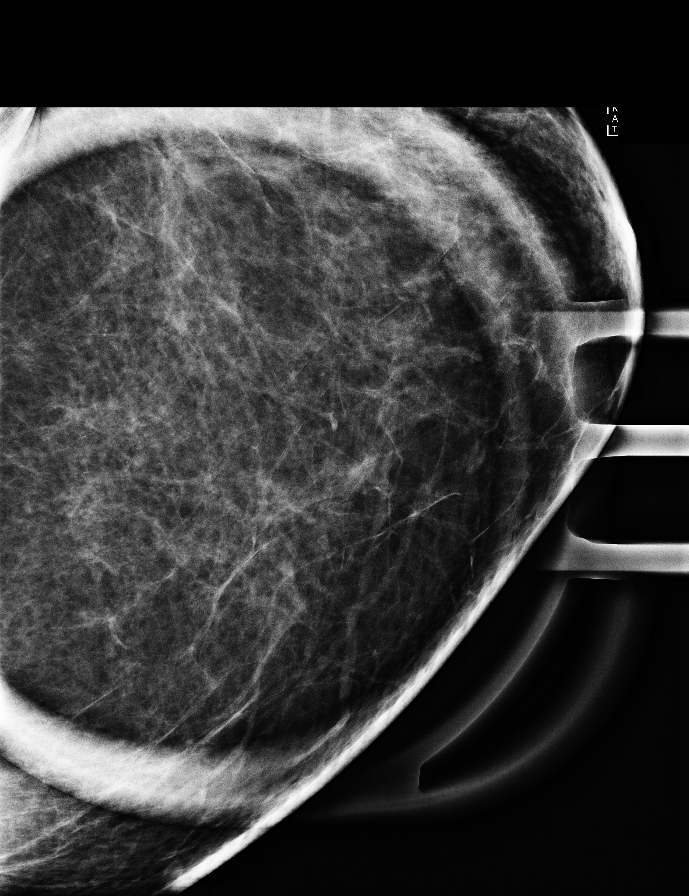

[L CC synth-2D]
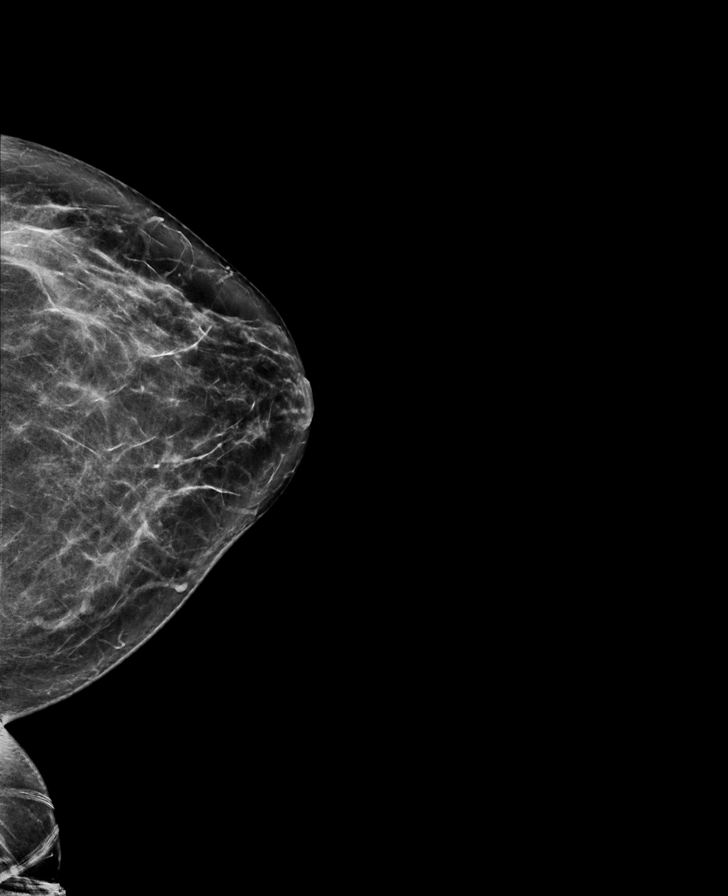

[L MLO synth-2D]
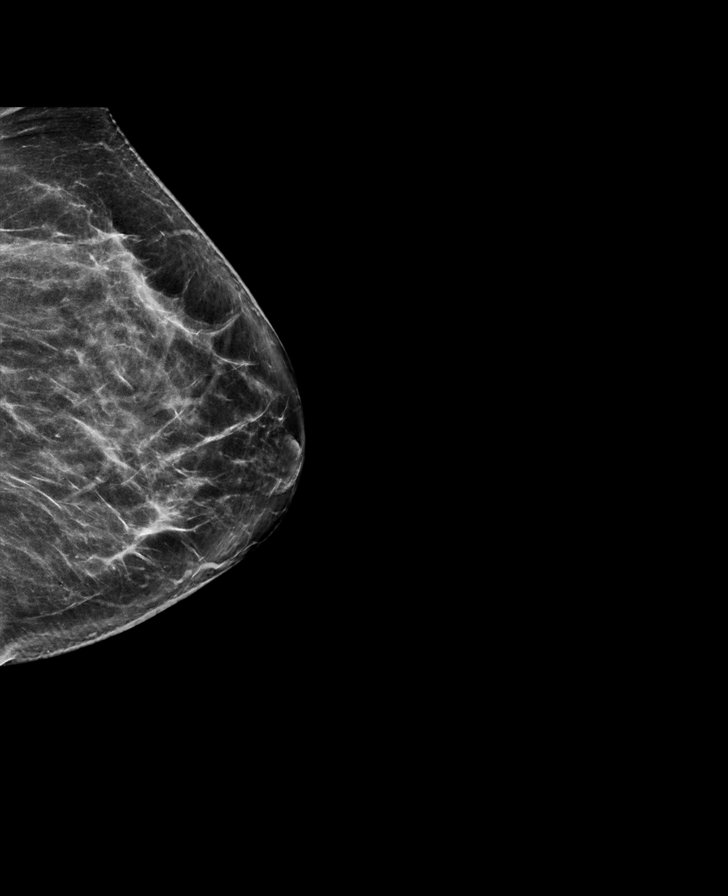

[L CC tomo · tomo slice 37/72.0]
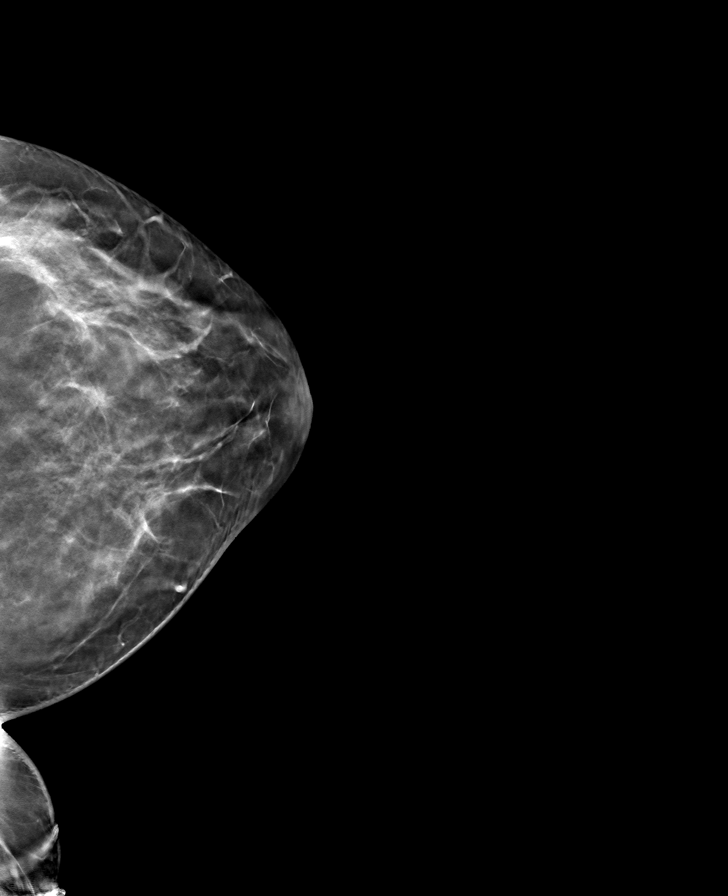

[L MLO tomo · tomo slice 39/77.0]
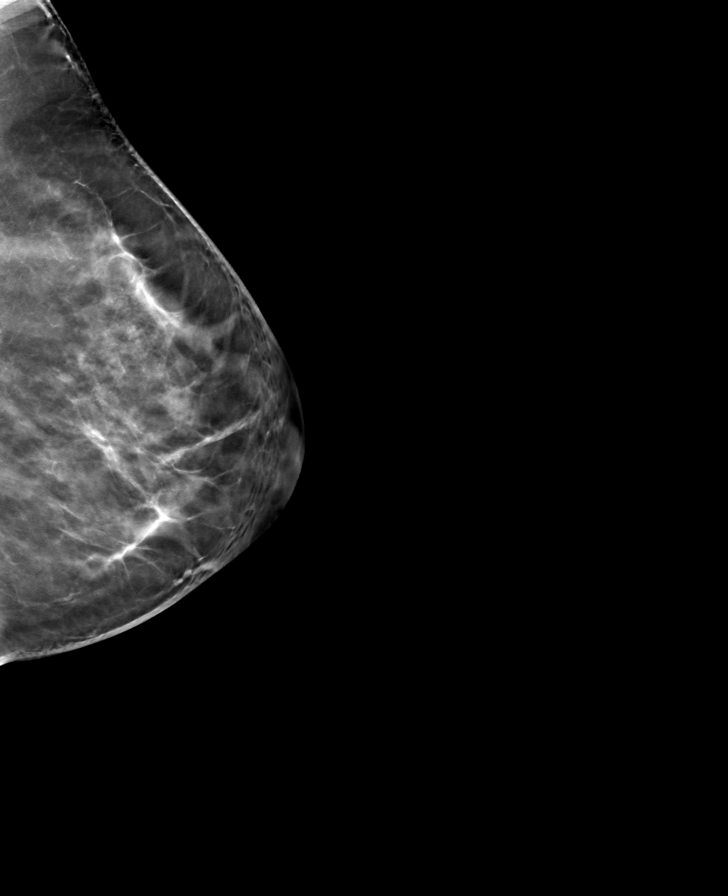

[8 of 16 positions shown; findings below may reference images not displayed]

ACR Breast Density Category c: The breast tissue is heterogeneously
dense, which may obscure small masses.
FINDINGS: The calcifications in the lower slightly inner left breast are
stable. On the tomosynthesis images these calcifications can be
localized to the skin. One of the MLO views the calcifications are
also seen within the skin.

Mammographic images were processed with CAD.
IMPRESSION: The left breast calcifications are within the skin, and are benign.

RECOMMENDATION:
Return to routine screening mammography is recommended. The patient
will be due for screening in [DATE].

I have discussed the findings and recommendations with the patient.
Results were also provided in writing at the conclusion of the
visit. If applicable, a reminder letter will be sent to the patient
regarding the next appointment.

BI-RADS CATEGORY  2: Benign.

## 2018-11-02 ENCOUNTER — Other Ambulatory Visit: Payer: Self-pay | Admitting: Internal Medicine

## 2018-11-03 ENCOUNTER — Emergency Department (HOSPITAL_BASED_OUTPATIENT_CLINIC_OR_DEPARTMENT_OTHER): Payer: 59

## 2018-11-03 ENCOUNTER — Encounter (HOSPITAL_BASED_OUTPATIENT_CLINIC_OR_DEPARTMENT_OTHER): Payer: Self-pay | Admitting: *Deleted

## 2018-11-03 ENCOUNTER — Emergency Department (HOSPITAL_BASED_OUTPATIENT_CLINIC_OR_DEPARTMENT_OTHER)
Admission: EM | Admit: 2018-11-03 | Discharge: 2018-11-03 | Disposition: A | Discharge status: Home / Self Care | Payer: 59 | Attending: Emergency Medicine | Admitting: Emergency Medicine

## 2018-11-03 ENCOUNTER — Other Ambulatory Visit: Payer: Self-pay

## 2018-11-03 DIAGNOSIS — Y939 Activity, unspecified: Secondary | ICD-10-CM | POA: Insufficient documentation

## 2018-11-03 DIAGNOSIS — Z79899 Other long term (current) drug therapy: Secondary | ICD-10-CM | POA: Diagnosis not present

## 2018-11-03 DIAGNOSIS — Y92009 Unspecified place in unspecified non-institutional (private) residence as the place of occurrence of the external cause: Secondary | ICD-10-CM | POA: Insufficient documentation

## 2018-11-03 DIAGNOSIS — S7012XA Contusion of left thigh, initial encounter: Secondary | ICD-10-CM | POA: Insufficient documentation

## 2018-11-03 DIAGNOSIS — I10 Essential (primary) hypertension: Secondary | ICD-10-CM | POA: Insufficient documentation

## 2018-11-03 DIAGNOSIS — S8012XA Contusion of left lower leg, initial encounter: Secondary | ICD-10-CM

## 2018-11-03 DIAGNOSIS — Z7901 Long term (current) use of anticoagulants: Secondary | ICD-10-CM | POA: Diagnosis not present

## 2018-11-03 DIAGNOSIS — S0990XA Unspecified injury of head, initial encounter: Secondary | ICD-10-CM | POA: Insufficient documentation

## 2018-11-03 DIAGNOSIS — W010XXA Fall on same level from slipping, tripping and stumbling without subsequent striking against object, initial encounter: Secondary | ICD-10-CM | POA: Diagnosis not present

## 2018-11-03 DIAGNOSIS — Y999 Unspecified external cause status: Secondary | ICD-10-CM | POA: Insufficient documentation

## 2018-11-03 DIAGNOSIS — W19XXXA Unspecified fall, initial encounter: Secondary | ICD-10-CM

## 2018-11-03 IMAGING — CT CT HEAD W/O CM
3 series · 15 of 47 positions shown, 18 images · non-contrast
Comparison: CT scan [DATE] and [DATE]

CLINICAL DATA: Fell and hit head tonight.  History of prior stroke.

EXAM:
CT HEAD WITHOUT CONTRAST
TECHNIQUE: Contiguous axial images were obtained from the base of the skull
through the vertex without intravenous contrast.

[Series 2: head wo · axial · 0.38mm/px · z∈[-149,-14]mm · 9 of 33 slices shown, 12 images]
[im 3/33  brain]
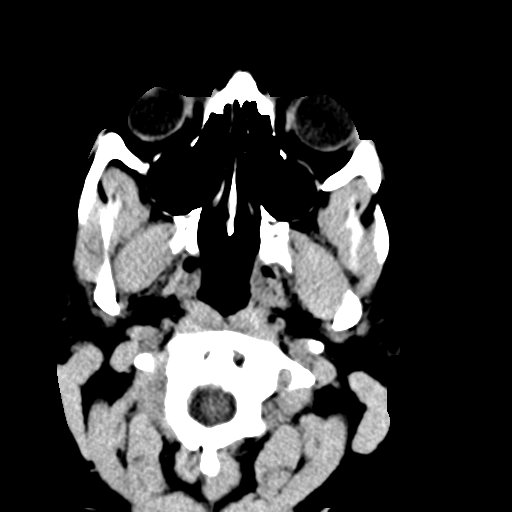
[im 3/33  bone]
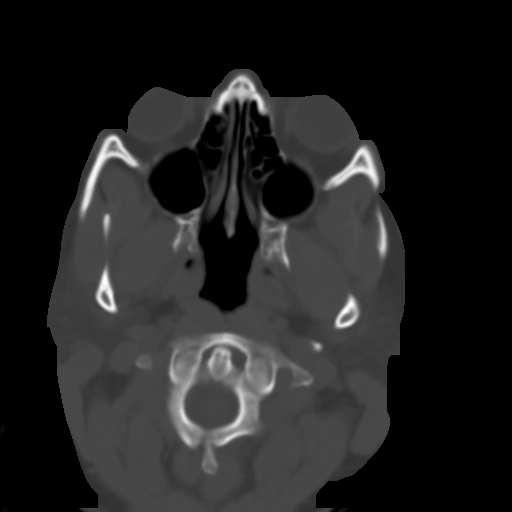
[im 6/33  brain]
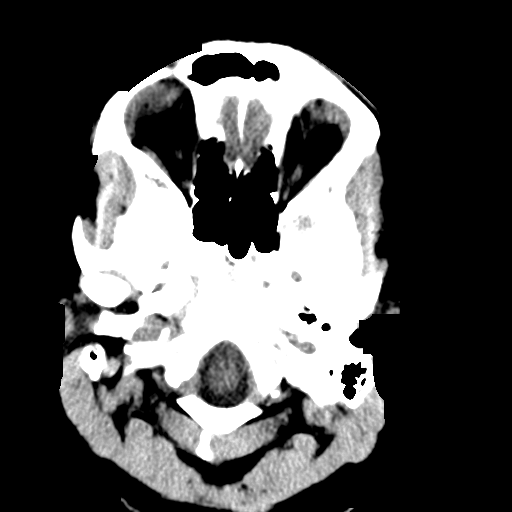
[im 9/33  brain]
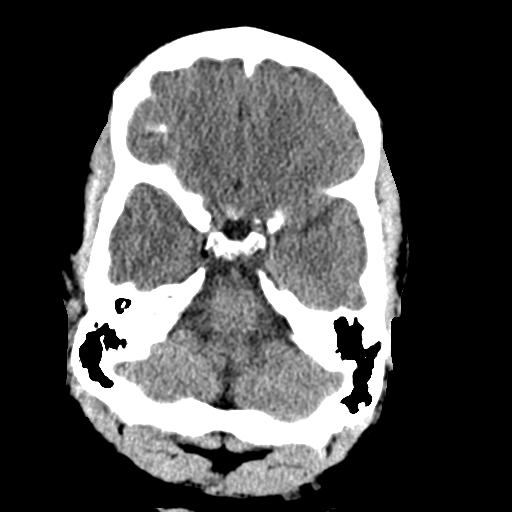
[im 13/33  brain]
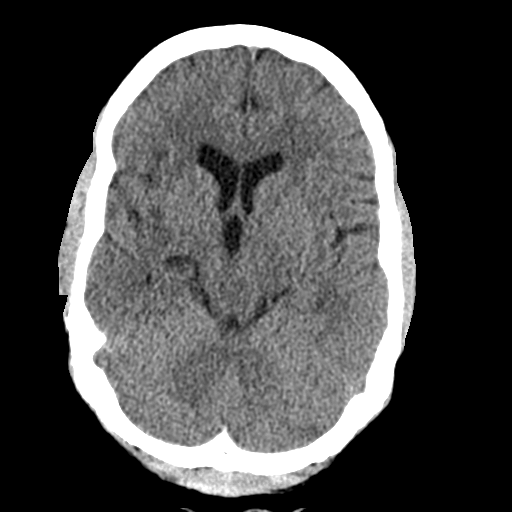
[im 17/33  brain]
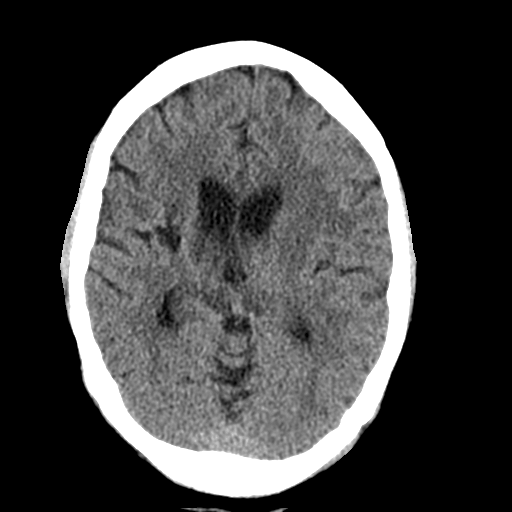
[im 17/33  bone]
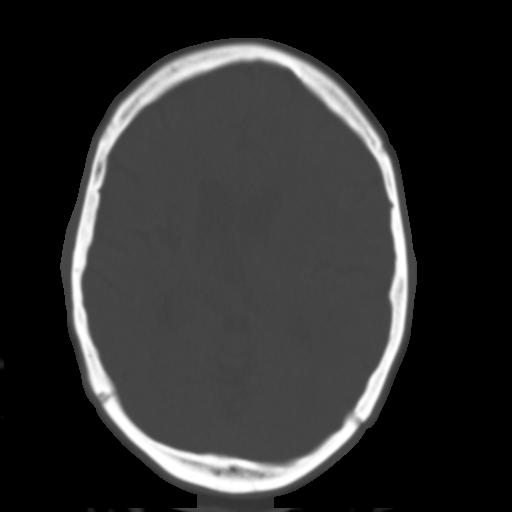
[im 20/33  brain]
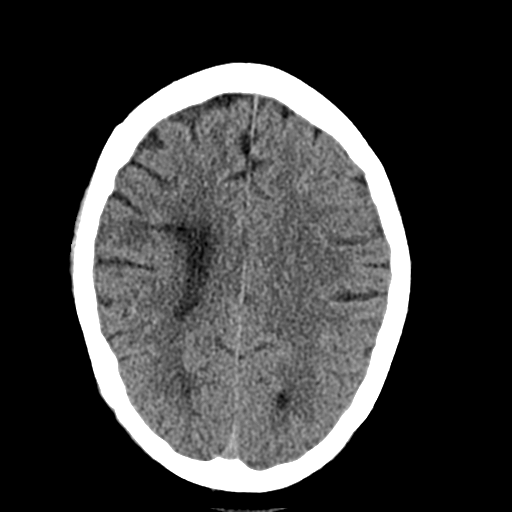
[im 24/33  brain]
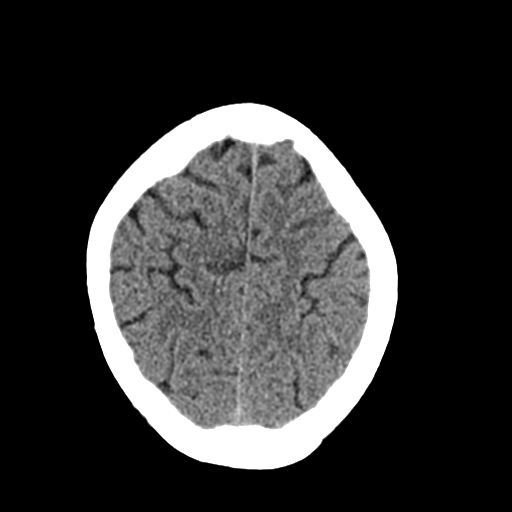
[im 27/33  brain]
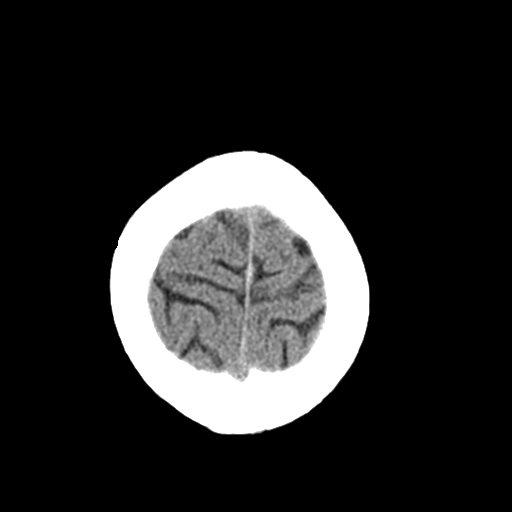
[im 30/33  brain]
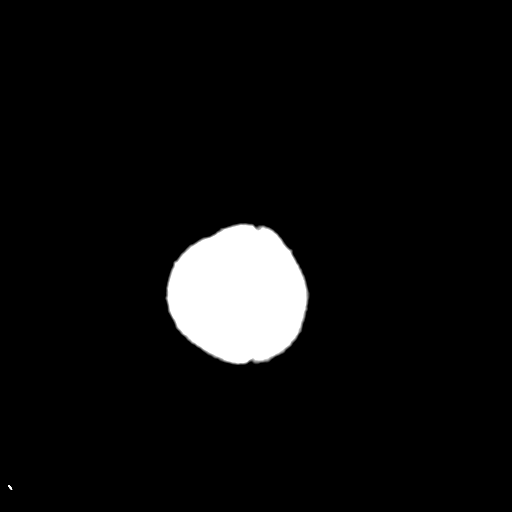
[im 30/33  bone]
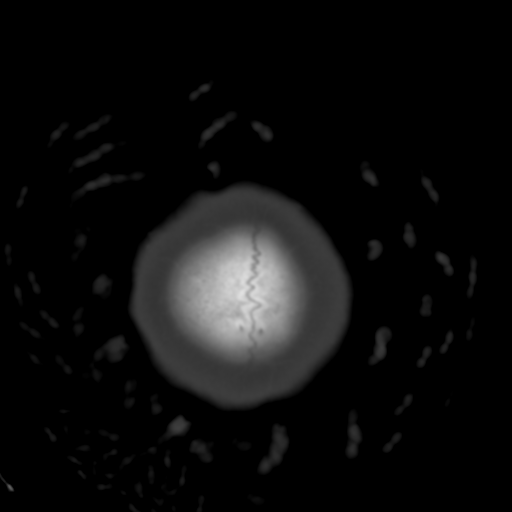

[Series 4: cor soft · coronal · 0.31mm/px · 3 of 71 slices shown]
[im 24/71  brain]
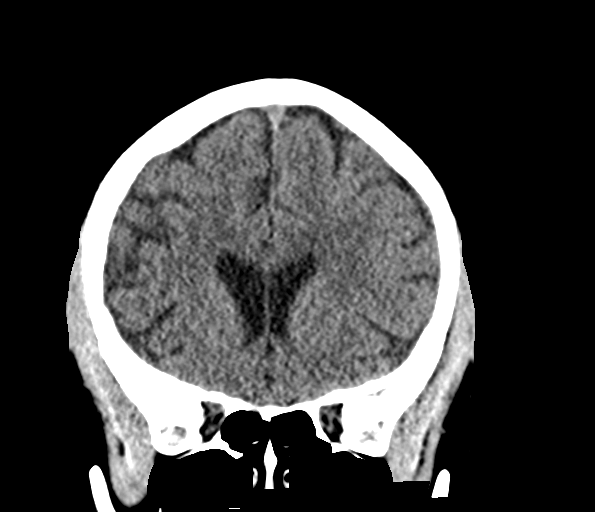
[im 32/71  brain]
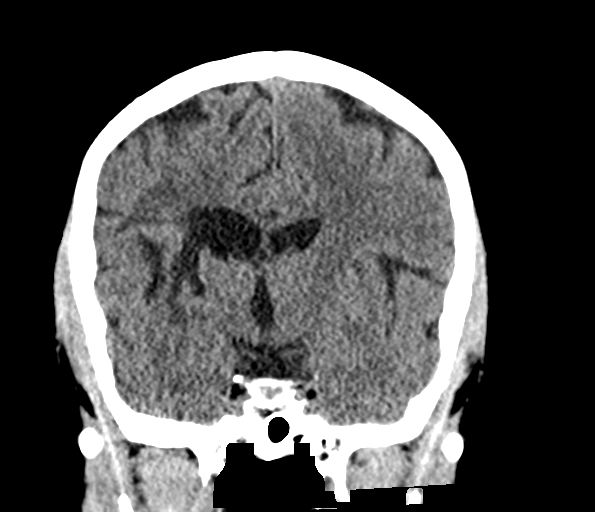
[im 39/71  brain]
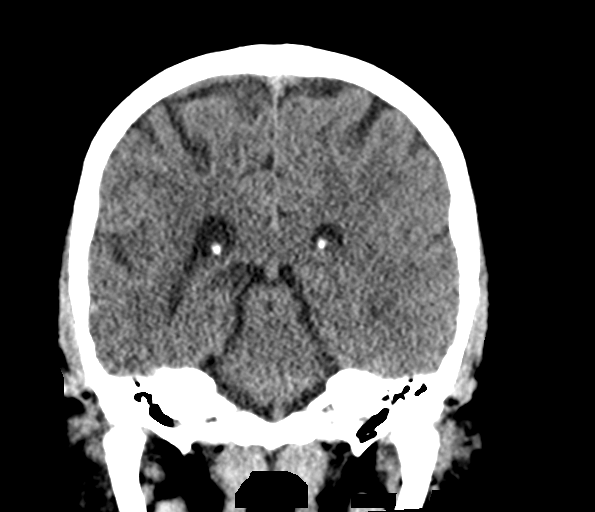

[Series 5: sag soft · sagittal · 0.31mm/px · 3 of 63 slices shown]
[im 21/63  brain]
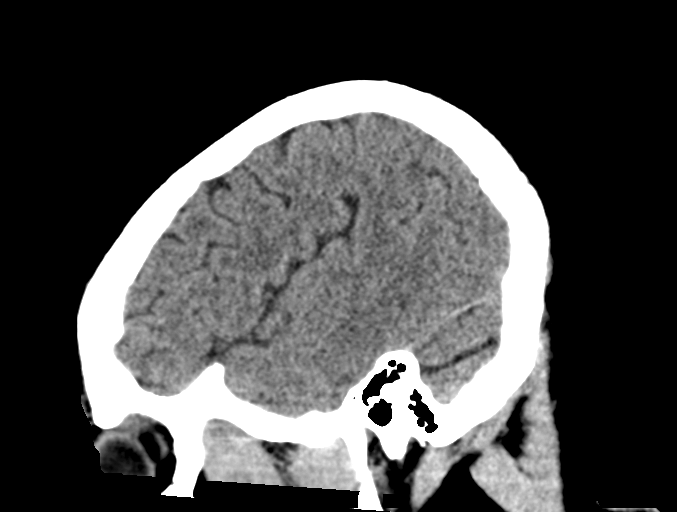
[im 32/63  brain]
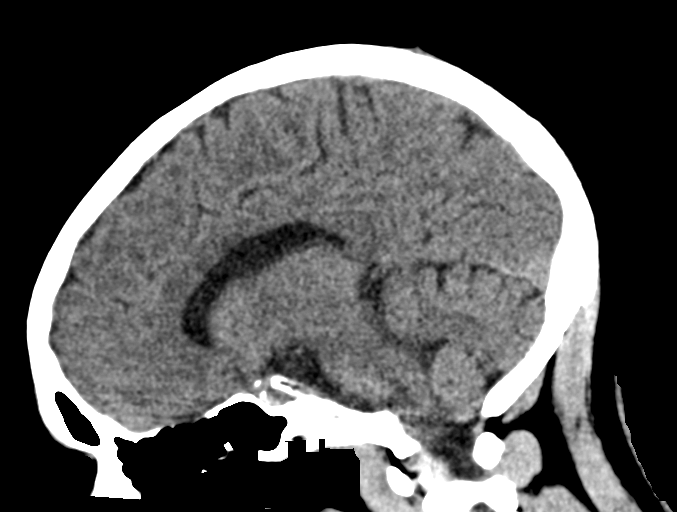
[im 42/63  brain]
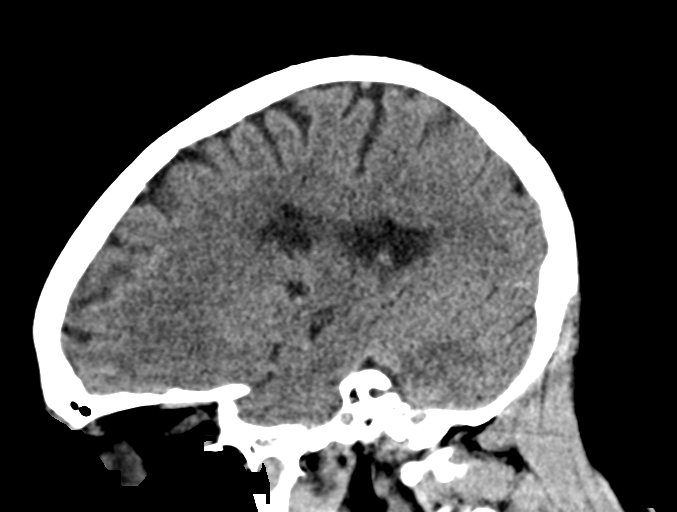

[15 of 47 positions shown; findings below may reference images not displayed]

FINDINGS: Brain: Stable area of encephalomalacia in the right temporoparietal
region and ex vacuo dilatation of the right lateral ventricle. The
ventricles are in the midline without mass effect or shift. No
extra-axial fluid collections are identified. The gray-white
differentiation is maintained. No findings for acute hemispheric
infarction or intracranial hemorrhage. The brainstem and cerebellum
appear normal.

Vascular: No hyperdense vessel or unexpected calcification.

Skull: No skull fracture or bone lesion.

Sinuses/Orbits: The paranasal sinuses and mastoid air cells are
clear. The globes are intact.

Other: No scalp lesions or hematoma.
IMPRESSION: 1. Remote infarct in the right temporoparietal area.
2. No new/acute intracranial findings or skull fracture.

## 2018-11-03 IMAGING — DX DG HIP (WITH OR WITHOUT PELVIS) 1V*L*
3 series · 3 of 3 positions shown · non-contrast
Comparison: None.

CLINICAL DATA: Fell today.  Left hip pain.

EXAM:
DG HIP (WITH OR WITHOUT PELVIS) 1V*L*

[pelvis ap]
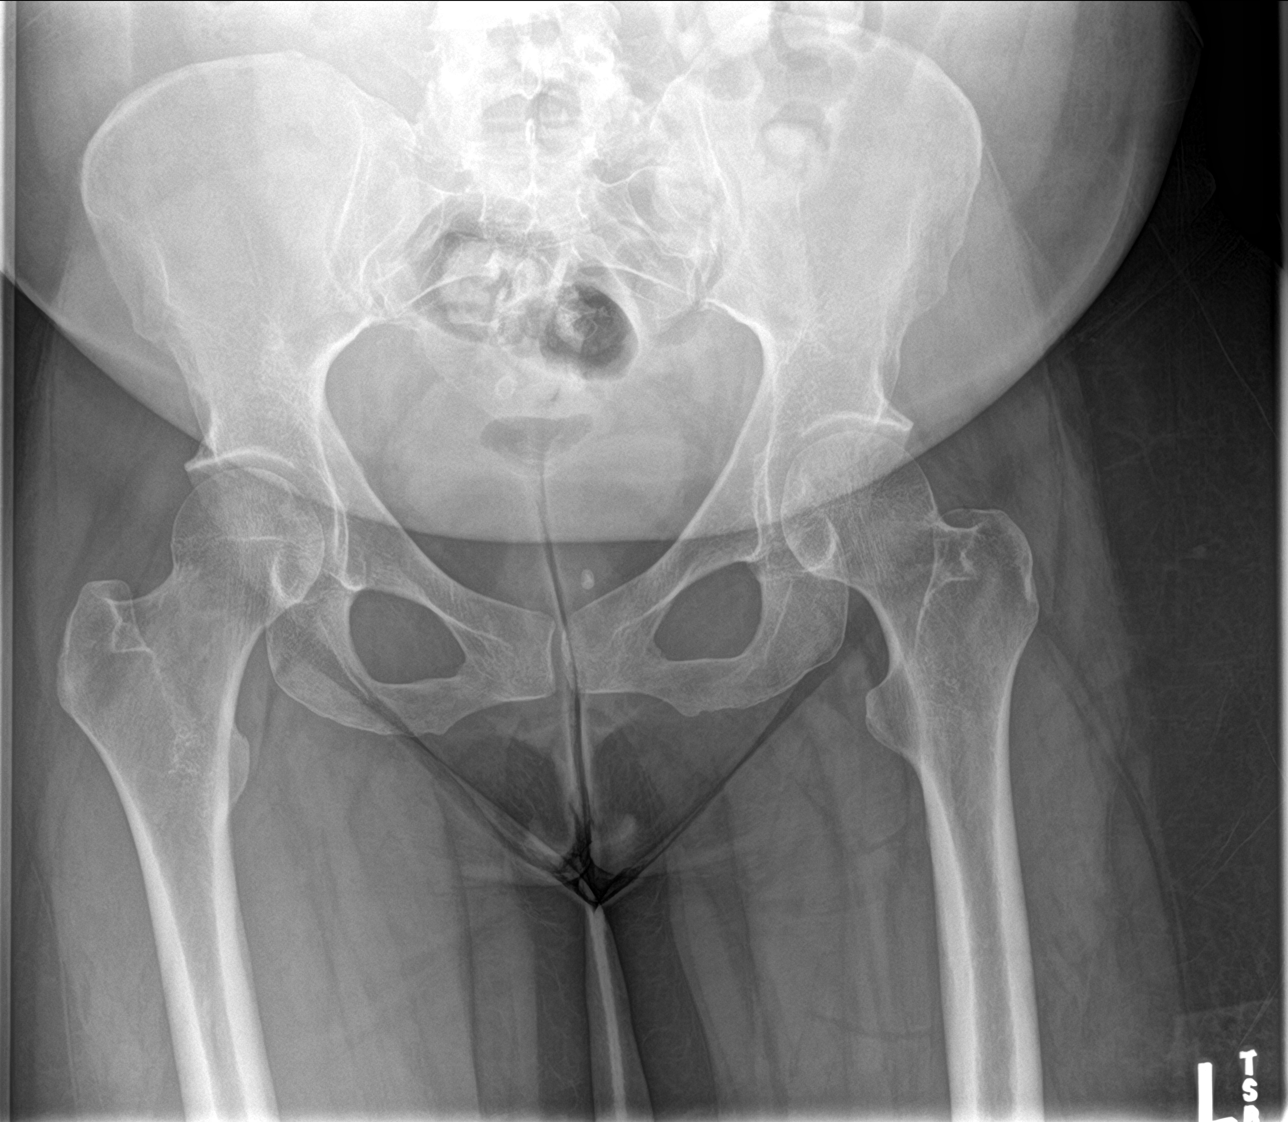

[hip ap]
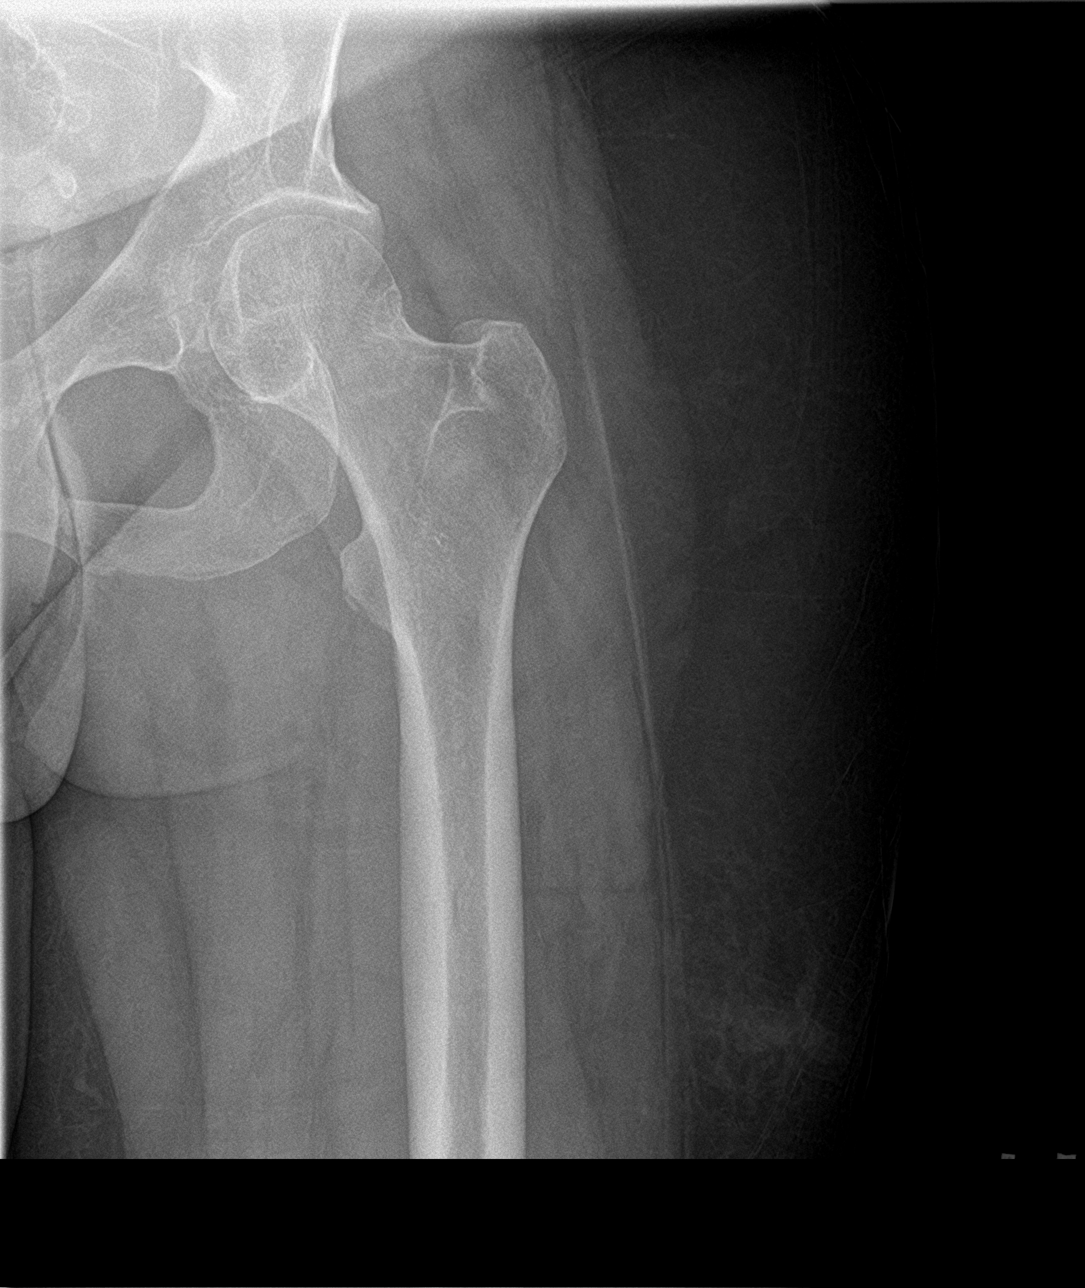

[hip frog leg]
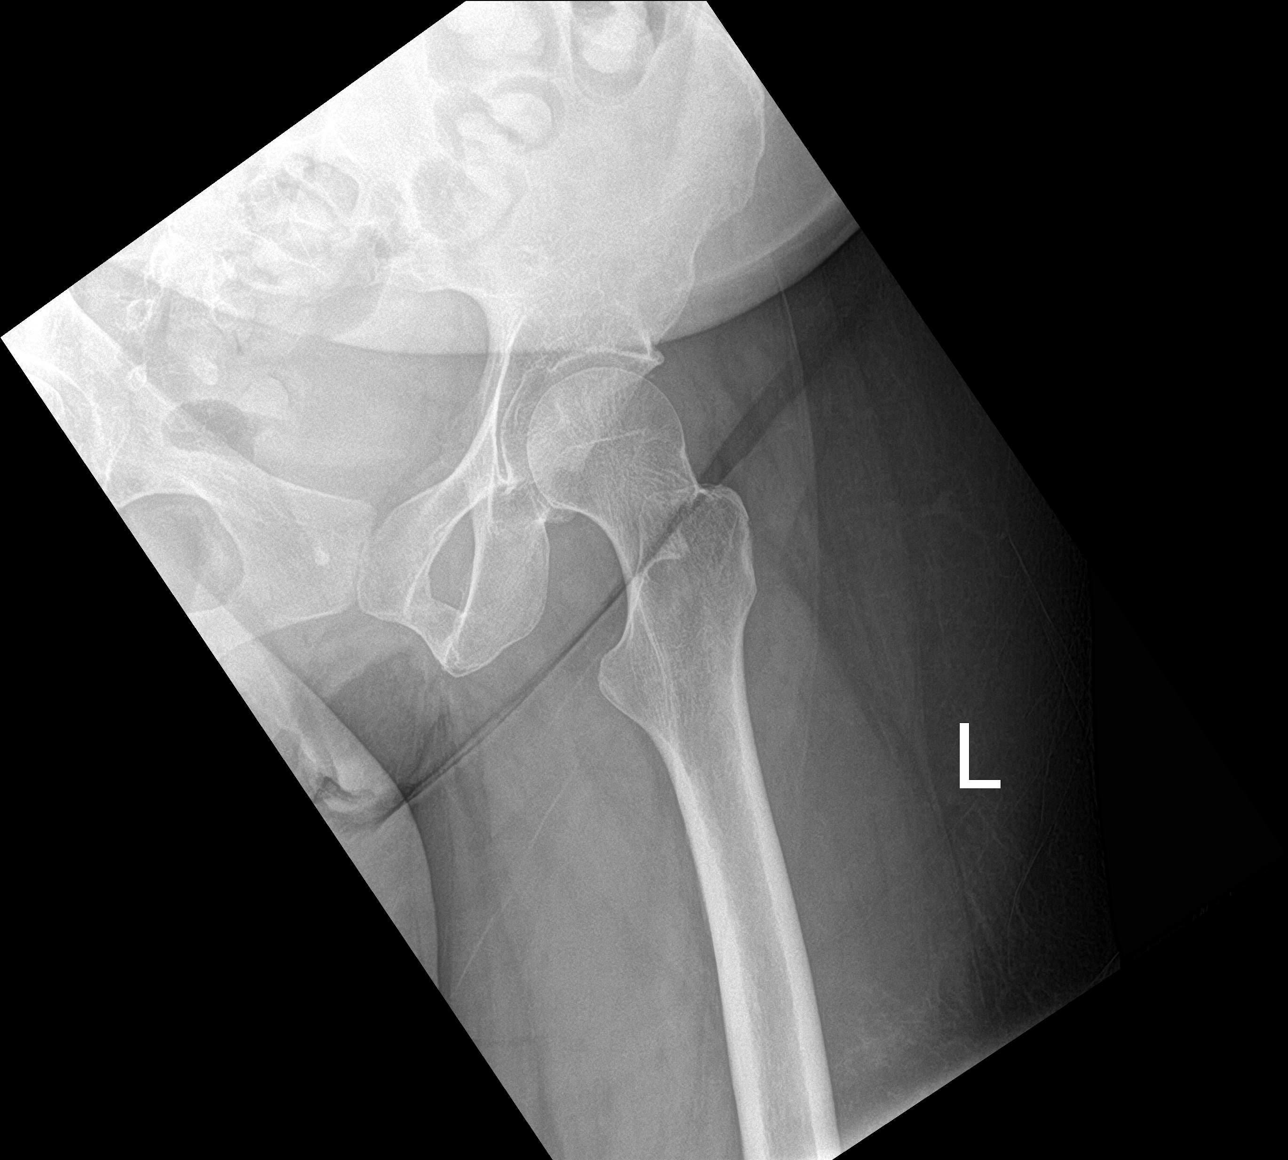

[3 of 3 positions shown; findings below may reference images not displayed]

FINDINGS: Both hips are normally located. No acute fracture or plain film
evidence of avascular necrosis. The pubic symphysis and SI joints
are intact. No pelvic fractures or bone lesions.
IMPRESSION: No acute bony findings.

## 2018-11-03 NOTE — ED Triage Notes (Signed)
Pt c/o fall with head injury x 3 hrs ago , pt is on blood thinners

## 2018-11-03 NOTE — ED Notes (Signed)
Pt lost her footing when she got up from the bed since she normally uses a cane and has left side weakness from a stroke. Pt fell backwards and hit the back of her head along with her upper left leg. There is no hematoma to head, but leg has a linear hematoma with surrounding bruising. Pt states she does not think she is on plavix any longer and per the most recent note from Dr. Larose Kells, Plavix is not listed as a medication she takes. Pt is alert and oriented, awaiting EDP evaluation.

## 2018-11-03 NOTE — Discharge Instructions (Addendum)
Your head CT and x-ray of your hip are normal today. Treat your symptoms, with Tylenol every 4 hours as needed for pain.  You can apply ice to your leg for 20 minutes at a time. Follow up with your primary care provider. Return to the emergency department if you develop severe headache, vision changes, vomiting, or new or concerning symptoms.

## 2018-11-03 NOTE — ED Provider Notes (Signed)
[place] EMERGENCY DEPARTMENT Provider Note   [name]: [id number] Arrival date & time: 11/03/18  1724     History   Chief Complaint Chief Complaint  Patient presents with  . Head Injury    HPI Abigail Campos is a 47 y.o. female w PMHx HTN, HLD, stroke w residual spastic hemiparesis of the left side, presenting to the ED after mechanical fall that occurred about 3 hours PTA. She states she normal ambulates with a leg brace and a cane, when she got out of bed she lost her balance falling backwards. She hit the back of her head on the floor, without LOC. She states she has had a very mild HA since the fall and pain to the left thigh. Pain to the thigh is worse with palpation. Denies pain to the groin, knee, or ankle. Denies vision changes, neck pain, back pain, abdominal pain, N/V, or new neuro symptoms. Patient's husband only noticed she seems more stiff when going from sitting to standing since the fall, but otherwise without new weakness or neuro deficits. Pt states she takes clopidogrel daily.  The history is provided by the spouse and the patient.    Past Medical History:  Diagnosis Date  . Essential hypertension 10/19/2007   03-2010: metoprolol changed to bystolic (was not feeling well on it, no specific allergy or reaction)    . Hyperlipemia   . Hypertension   . Stroke [facility])     Patient Active Problem List   Diagnosis Date Noted  . Spastic hemiparesis affecting dominant side ([place]) 01/04/2018  . Hyperglycemia 12/29/2016  . Anxiety and depression 12/29/2016  . Muscle spasticity   . Obesity (BMI 30.0-34.9) 04/08/2016  . HLD (hyperlipidemia) 04/08/2016  . Basal ganglia hemorrhage ([place]) 04/08/2016  . Dysphagia as late effect of cerebrovascular disease   . Migraine with aura and without status migrainosus, not intractable   . Gait disturbance, post-stroke   . Hemiplegia, post-stroke ([place])   . Aphasia, post-stroke   . Dysarthria, post-stroke   . ICH  (intracerebral hemorrhage) ([facility]) - R basal ganglia due to hypertensive emergency 04/01/2016  . Upper airway cough syndrome 12/06/2015  . PCP NOTES >>>>>>>>>>>>>>>>>>>>>>>>>>>.. 09/25/2015  . Insomnia 12/28/2012  . Annual physical exam 11/23/2011  . Essential hypertension 10/19/2007    Past Surgical History:  Procedure Laterality Date  . ABDOMINAL HYSTERECTOMY  11-14-07   no oophorectomy per surgical report  . CESAREAN SECTION     [id number]  . INGUINAL HERNIA REPAIR     2004     OB History    Gravida  2   Para  2   Term      Preterm      AB      Living        SAB      TAB      Ectopic      Multiple      Live Births               Home Medications    Prior to Admission medications   Medication Sig Start Date End Date Taking? Authorizing Provider  atorvastatin (LIPITOR) 20 MG tablet Take 1 tablet (20 mg total) by mouth daily at 6 PM. 08/23/18   Colon Branch, MD  baclofen (LIORESAL) 10 MG tablet Take 1 tablet (10 mg total) by mouth 3 (three) times daily. 07/22/18   Colon Branch, MD  buPROPion (WELLBUTRIN) 100 MG tablet Take 1 tablet (100 mg total) by  mouth 2 (two) times daily. For the first week take only 1 tablet daily 08/18/18   Colon Branch, MD  clopidogrel (PLAVIX) 75 MG tablet Take by mouth. 11/19/17   [provider]  escitalopram (LEXAPRO) 20 MG tablet Take 1 tablet (20 mg total) by mouth daily. 09/06/18   Colon Branch, MD  furosemide (LASIX) 20 MG tablet Take 1 tablet (20 mg total) by mouth daily. 08/23/18   Colon Branch, MD  lisinopril (PRINIVIL,ZESTRIL) 2.5 MG tablet Take 1 tablet (2.5 mg total) by mouth daily. 11/02/18   Colon Branch, MD  nebivolol (BYSTOLIC) 5 MG tablet Take 1 tablet (5 mg total) by mouth every 12 (twelve) hours. 05/30/18   Colon Branch, MD  traZODone (DESYREL) 50 MG tablet Take 1 tablet (50 mg total) by mouth at bedtime. 05/30/18   Colon Branch, MD    Family History Family History  Problem Relation Age of Onset  . Diabetes Mother   .  Hypertension Mother   . Glaucoma Mother   . Colon cancer Mother        mother age 52  . Stroke Paternal Grandfather   . Heart disease Unknown        2 uncles died 03/11/11 from CAD-CHF  . Stroke Maternal Grandfather   . Diabetes Other        siblings  . Hypertension Other        siblings  . Breast cancer Neg Hx     Social History Social History   Tobacco Use  . Smoking status: Never Smoker  . Smokeless tobacco: Never Used  Substance Use Topics  . Alcohol use: No  . Drug use: No     Allergies   Bee venom; Peanut-containing drug products; Hydrocodone; Isovue [iopamidol]; Ambien [zolpidem tartrate]; Aspirin; and Latex   Review of Systems Review of Systems  Eyes: Negative for visual disturbance.  Gastrointestinal: Negative for abdominal pain, nausea and vomiting.  Musculoskeletal: Positive for myalgias. Negative for back pain and neck pain.  Skin: Positive for color change. Negative for wound.  Neurological: Positive for weakness (chronic, left-sided) and headaches. Negative for syncope.  All other systems reviewed and are negative.    Physical Exam Updated Vital Signs BP (!) 129/99 (BP Location: Right Arm)   Pulse 80   Temp 98.3 F (36.8 C) (Oral)   Resp 20   Ht 4\' 11"  (1.499 m)   Wt 65.8 kg   SpO2 100%   BMI 29.29 kg/m   Physical Exam  Constitutional: She is oriented to person, place, and time. She appears well-developed and well-nourished. No distress.  HENT:  Head: Normocephalic and atraumatic.  No scalp hematoma or tenderness.  Eyes: Pupils are equal, round, and reactive to light. Conjunctivae and EOM are normal.  Neck: Normal range of motion. Neck supple.  Cardiovascular: Normal rate and regular rhythm.  Pulmonary/Chest: Effort normal and breath sounds normal.  Abdominal: Soft. Bowel sounds are normal. There is no tenderness. There is no guarding.  Musculoskeletal:  No midline spinal or paraspinal tenderness, no bony step-offs or gross deformities.   Proximal left lateral thigh with large hematoma with associated tenderness.  Hip is without pain with passive internal and external rotation.  No obvious deformity.  Patient is able to flex hip small amount (as much as her chronic weakness allows) without pain.  Knee is without tenderness or deformity.  Remainder of extremities appear atraumatic.  Neurological: She is alert and oriented to person, place, and  time.   Left upper and lower extremities are weak.  Left hand is contracted. There is a left-sided facial droop (chronic).    Skin: Skin is warm.  Psychiatric: She has a normal mood and affect. Her behavior is normal.  Nursing note and vitals reviewed.    ED Treatments / Results  Labs (all labs ordered are listed, but only abnormal results are displayed) Labs Reviewed - No data to display  EKG None  Radiology Ct Head Wo Contrast  Result Date: 11/03/2018 CLINICAL DATA:  [place] and hit head tonight.  History of prior stroke. EXAM: CT HEAD WITHOUT CONTRAST TECHNIQUE: Contiguous axial images were obtained from the base of the skull through the vertex without intravenous contrast. COMPARISON:  CT scan 02/13/2017 and 07/18/2014 FINDINGS: Brain: Stable area of encephalomalacia in the right temporoparietal region and ex vacuo dilatation of the right lateral ventricle. The ventricles are in the midline without mass effect or shift. No extra-axial fluid collections are identified. The gray-white differentiation is maintained. No findings for acute hemispheric infarction or intracranial hemorrhage. The brainstem and cerebellum appear normal. Vascular: No hyperdense vessel or unexpected calcification. Skull: No skull fracture or bone lesion. Sinuses/Orbits: The paranasal sinuses and mastoid air cells are clear. The globes are intact. Other: No scalp lesions or hematoma. IMPRESSION: 1. Remote infarct in the right temporoparietal area. 2. No new/acute intracranial findings or skull fracture. Electronically  Signed   By: Marijo Sanes M.D.   On: 11/03/2018 19:32   Dg Hip Unilat W Or Wo Pelvis 1 View Left  Result Date: 11/03/2018 CLINICAL DATA:  [place] today.  Left hip pain. EXAM: DG HIP (WITH OR WITHOUT PELVIS) 1V*L* COMPARISON:  None. FINDINGS: Both hips are normally located. No acute fracture or plain film evidence of avascular necrosis. The pubic symphysis and SI joints are intact. No pelvic fractures or bone lesions. IMPRESSION: No acute bony findings. Electronically Signed   By: Marijo Sanes M.D.   On: 11/03/2018 19:33    Procedures Procedures (including critical care time)  Medications Ordered in ED Medications - No data to display   Initial Impression / Assessment and Plan / ED Course  I have reviewed the triage vital signs and the nursing notes.  Pertinent labs & imaging results that were available during my care of the patient were reviewed by me and considered in my medical decision making (see chart for details).     Patient presenting with mechanical fall at home, lost her balance and fell backwards hitting her head.  No LOC.  Reports with minor headache without vision changes, vomiting, neck or back pain.  She does have some pain to the left thigh. Neuro exam is consistent with chronic deficits after stroke.  Patient's head appears atraumatic.  There is a hematoma to the left thigh with associated tenderness.  Hip without obvious deformity and without pain with passive range of motion.  CT head ordered in triage, due to antiplatelet therapy.  CT head is negative for acute pathology.  X-ray of left hip and pelvis is neg for fracture or dislocation.  On reevaluation, patient denies any complaints. Pt denied pain medication in the emergency department.  Results discussed.  Discussed symptomatic management for hematoma and strict return precautions regarding minor head injury.  Patient verbalized understanding is agreeable to plan to discharge.  Recommend PCP follow-up.  Discussed  results, findings, treatment and follow up. Patient advised of return precautions. Patient verbalized understanding and agreed with plan.  Final Clinical Impressions(s) /  ED Diagnoses   Final diagnoses:  Fall at home, initial encounter  Hematoma of left lower extremity, initial encounter  Minor head injury, initial encounter    ED Discharge Orders    None       Rivaan Kendall, [place] N, PA-C 11/03/18 Daiva Huge, MD 11/03/18 [id number]

## 2018-11-07 ENCOUNTER — Other Ambulatory Visit: Payer: Self-pay | Admitting: Internal Medicine

## 2018-11-16 ENCOUNTER — Encounter: Payer: Self-pay | Admitting: Physical Medicine & Rehabilitation

## 2018-11-16 ENCOUNTER — Encounter: Payer: 59 | Attending: Physical Medicine & Rehabilitation | Admitting: Physical Medicine & Rehabilitation

## 2018-11-16 VITALS — BP 135/93 | HR 72 | Ht 59.0 in | Wt 165.0 lb

## 2018-11-16 DIAGNOSIS — I69254 Hemiplegia and hemiparesis following other nontraumatic intracranial hemorrhage affecting left non-dominant side: Secondary | ICD-10-CM | POA: Diagnosis not present

## 2018-11-16 DIAGNOSIS — R58 Hemorrhage, not elsewhere classified: Secondary | ICD-10-CM | POA: Diagnosis not present

## 2018-11-16 DIAGNOSIS — I69154 Hemiplegia and hemiparesis following nontraumatic intracerebral hemorrhage affecting left non-dominant side: Secondary | ICD-10-CM | POA: Diagnosis not present

## 2018-11-16 DIAGNOSIS — I10 Essential (primary) hypertension: Secondary | ICD-10-CM | POA: Insufficient documentation

## 2018-11-16 DIAGNOSIS — E785 Hyperlipidemia, unspecified: Secondary | ICD-10-CM | POA: Diagnosis not present

## 2018-11-16 DIAGNOSIS — R252 Cramp and spasm: Secondary | ICD-10-CM | POA: Diagnosis not present

## 2018-11-16 DIAGNOSIS — R269 Unspecified abnormalities of gait and mobility: Secondary | ICD-10-CM

## 2018-11-16 DIAGNOSIS — G43909 Migraine, unspecified, not intractable, without status migrainosus: Secondary | ICD-10-CM | POA: Insufficient documentation

## 2018-11-16 DIAGNOSIS — I69398 Other sequelae of cerebral infarction: Secondary | ICD-10-CM

## 2018-11-16 DIAGNOSIS — I69959 Hemiplegia and hemiparesis following unspecified cerebrovascular disease affecting unspecified side: Secondary | ICD-10-CM

## 2018-11-16 NOTE — Progress Notes (Addendum)
Dysport: Procedure Note Patient Name: Abigail Campos DOB: Dec 06, 1971 MRN: [id number] Date: 11/16/2018  Procedure: Botulinum toxin administration Guidance: EMG Diagnosis: Spastic left nondominant IPH Attending: Delice Lesch, MD   Informed consent: Risks, benefits & options of the procedure are explained to the patient (and/or family). The patient elects to proceed with procedure. Risks include but are not limited to weakness, respiratory distress, dry mouth, ptosis, antibody formation, worsening of some areas of function. Benefits include decreased abnormal muscle tone, improved hygiene and positioning, decreased skin breakdown and, in some cases, decreased pain. Options include conservative management with oral antispasticity agents, phenol chemodenervation of nerve or at motor nerve branches. More invasive options include intrathecal balcofen adminstration for appropriate candidates. Surgical options may include tendon lengthening or transposition or, rarely, dorsal rhizotomy.   History/Physical Examination: 47 y.o.  Year old right-handed female with history of hypertension, migraine headaches presents for follow up after right basal ganglia hemorrhage.   Modified [place]: Left 1/4 elbow flexors, 11/4 wrist flexors, 2/4 left finger flexors, 1+/4 ankle plantar flexors.  Previous Treatments: Therapy/Range of motion Indication for guidance: Target active muscules  Procedure: Botulinum toxin was mixed with preservative free saline with a dilution of 0.5cc to 100 units. Targeted limb and muscles were identified. The skin was prepped with alcohol swabs and placement of needle tip in targeted muscle was confirmed using appropriate guidance. Prior to injection, positioning of needle tip outside of blood vessel was determined by pulling back on syringe plunger.  MUSCLE UNITS Left Med biceps: 100 units Left FCR: 100 units Left FCU: 100 units Left FDS: 100 units Left FDP: 100 units  Left Med  gastroc: 250 units Left Lat gastroc: 250 units   Total units used: 0768  Complications: None  Plan: Follow up in 6 weeks  Chioke Noxon Anil Lasalle Abee 10:41 AM

## 2018-11-21 ENCOUNTER — Ambulatory Visit (INDEPENDENT_AMBULATORY_CARE_PROVIDER_SITE_OTHER): Payer: 59 | Admitting: Psychology

## 2018-11-21 DIAGNOSIS — F4321 Adjustment disorder with depressed mood: Secondary | ICD-10-CM

## 2018-11-25 ENCOUNTER — Ambulatory Visit (INDEPENDENT_AMBULATORY_CARE_PROVIDER_SITE_OTHER): Payer: 59 | Admitting: Psychology

## 2018-11-25 DIAGNOSIS — F4321 Adjustment disorder with depressed mood: Secondary | ICD-10-CM | POA: Diagnosis not present

## 2018-12-02 ENCOUNTER — Ambulatory Visit (INDEPENDENT_AMBULATORY_CARE_PROVIDER_SITE_OTHER): Payer: 59 | Admitting: Psychology

## 2018-12-02 DIAGNOSIS — F4321 Adjustment disorder with depressed mood: Secondary | ICD-10-CM

## 2018-12-09 ENCOUNTER — Ambulatory Visit (INDEPENDENT_AMBULATORY_CARE_PROVIDER_SITE_OTHER): Payer: 59 | Admitting: Psychology

## 2018-12-09 DIAGNOSIS — F4321 Adjustment disorder with depressed mood: Secondary | ICD-10-CM | POA: Diagnosis not present

## 2018-12-23 ENCOUNTER — Ambulatory Visit: Payer: 59 | Admitting: Psychology

## 2018-12-28 ENCOUNTER — Encounter: Payer: 59 | Admitting: Physical Medicine & Rehabilitation

## 2019-01-17 ENCOUNTER — Other Ambulatory Visit: Payer: Self-pay | Admitting: Internal Medicine

## 2019-01-17 DIAGNOSIS — I610 Nontraumatic intracerebral hemorrhage in hemisphere, subcortical: Secondary | ICD-10-CM

## 2019-01-22 ENCOUNTER — Other Ambulatory Visit: Payer: Self-pay

## 2019-01-22 ENCOUNTER — Encounter (HOSPITAL_BASED_OUTPATIENT_CLINIC_OR_DEPARTMENT_OTHER): Payer: Self-pay | Admitting: Emergency Medicine

## 2019-01-22 ENCOUNTER — Emergency Department (HOSPITAL_BASED_OUTPATIENT_CLINIC_OR_DEPARTMENT_OTHER)
Admission: EM | Admit: 2019-01-22 | Discharge: 2019-01-22 | Disposition: A | Discharge status: Home / Self Care | Payer: 59 | Attending: Emergency Medicine | Admitting: Emergency Medicine

## 2019-01-22 DIAGNOSIS — Z79899 Other long term (current) drug therapy: Secondary | ICD-10-CM | POA: Insufficient documentation

## 2019-01-22 DIAGNOSIS — Z91040 Latex allergy status: Secondary | ICD-10-CM | POA: Insufficient documentation

## 2019-01-22 DIAGNOSIS — I10 Essential (primary) hypertension: Secondary | ICD-10-CM | POA: Diagnosis not present

## 2019-01-22 DIAGNOSIS — R51 Headache: Secondary | ICD-10-CM | POA: Diagnosis present

## 2019-01-22 DIAGNOSIS — Z7901 Long term (current) use of anticoagulants: Secondary | ICD-10-CM | POA: Insufficient documentation

## 2019-01-22 DIAGNOSIS — G43C0 Periodic headache syndromes in child or adult, not intractable: Secondary | ICD-10-CM

## 2019-01-22 DIAGNOSIS — Z91010 Allergy to peanuts: Secondary | ICD-10-CM | POA: Diagnosis not present

## 2019-01-22 MED ORDER — DIPHENHYDRAMINE HCL 50 MG/ML IJ SOLN
25.0000 mg | Freq: Once | INTRAMUSCULAR | Status: AC
  Administered 2019-01-22: 25 mg via INTRAMUSCULAR
  Filled 2019-01-22: qty 1

## 2019-01-22 MED ORDER — DEXAMETHASONE 6 MG PO TABS
10.0000 mg | ORAL_TABLET | Freq: Once | ORAL | Status: AC
  Administered 2019-01-22: 10 mg via ORAL
  Filled 2019-01-22: qty 1

## 2019-01-22 MED ORDER — PROCHLORPERAZINE EDISYLATE 10 MG/2ML IJ SOLN
10.0000 mg | Freq: Once | INTRAMUSCULAR | Status: AC
  Administered 2019-01-22: 10 mg via INTRAMUSCULAR
  Filled 2019-01-22: qty 2

## 2019-01-22 MED ORDER — METOCLOPRAMIDE HCL 5 MG/ML IJ SOLN
10.0000 mg | Freq: Once | INTRAMUSCULAR | Status: AC
  Administered 2019-01-22: 10 mg via INTRAMUSCULAR
  Filled 2019-01-22: qty 2

## 2019-01-22 NOTE — Discharge Instructions (Signed)
Return for worsening headache, fever.

## 2019-01-22 NOTE — ED Triage Notes (Signed)
Headache and nausea since last night.

## 2019-01-22 NOTE — ED Notes (Signed)
Pt c/o migraine-type pain and nausea today.

## 2019-01-22 NOTE — ED Provider Notes (Signed)
[facility] EMERGENCY DEPARTMENT Provider Note   [name]: [id number] Arrival date & time: 01/22/19  1846     History   Chief Complaint Chief Complaint  Patient presents with  . Headache    HPI Abigail Campos is a 48 y.o. female.  48 yo F with a chief complaint of a headache.  This feels like her prior migraines.  She has not had one in some time.  Started yesterday slow in onset.  Having some nausea but denies vomiting.  Worse with bright lights and loud noises.  Denies trauma denies fever denies cough or congestion.  She has had a stroke previously, left with left-sided weakness.  Denies any new neurologic deficit denies difficulty with speech or swallowing denies new right-sided weakness.  The history is provided by the patient.  Headache  Pain location:  Generalized Quality:  Dull Radiates to:  Does not radiate Severity currently:  10/10 Severity at highest:  10/10 Onset quality:  Gradual Duration:  1 day Timing:  Constant Progression:  Worsening Chronicity:  Recurrent Similar to prior headaches: yes   Context: bright light and loud noise   Relieved by:  Nothing Worsened by:  Nothing Ineffective treatments:  None tried Associated symptoms: photophobia   Associated symptoms: no congestion, no dizziness, no fever, no myalgias, no nausea and no vomiting     Past Medical History:  Diagnosis Date  . Essential hypertension 10/19/2007   03-2010: metoprolol changed to bystolic (was not feeling well on it, no specific allergy or reaction)    . Hyperlipemia   . Hypertension   . Stroke [facility])     Patient Active Problem List   Diagnosis Date Noted  . Spastic hemiparesis affecting dominant side ([place]) 01/04/2018  . Hyperglycemia 12/29/2016  . Anxiety and depression 12/29/2016  . Muscle spasticity   . Obesity (BMI 30.0-34.9) 04/08/2016  . HLD (hyperlipidemia) 04/08/2016  . Basal ganglia hemorrhage ([place]) 04/08/2016  . Dysphagia as late effect of cerebrovascular  disease   . Migraine with aura and without status migrainosus, not intractable   . Gait disturbance, post-stroke   . Hemiplegia, post-stroke ([place])   . Aphasia, post-stroke   . Dysarthria, post-stroke   . ICH (intracerebral hemorrhage) (HCC) - R basal ganglia due to hypertensive emergency 04/01/2016  . Upper airway cough syndrome 12/06/2015  . PCP NOTES >>>>>>>>>>>>>>>>>>>>>>>>>>>.. 09/25/2015  . Insomnia 12/28/2012  . Annual physical exam 11/23/2011  . Essential hypertension 10/19/2007    Past Surgical History:  Procedure Laterality Date  . ABDOMINAL HYSTERECTOMY  11-14-07   no oophorectomy per surgical report  . CESAREAN SECTION     [id number]  . INGUINAL HERNIA REPAIR     2004     OB History    Gravida  2   Para  2   Term      Preterm      AB      Living        SAB      TAB      Ectopic      Multiple      Live Births               Home Medications    Prior to Admission medications   Medication Sig Start Date End Date Taking? Authorizing Provider  atorvastatin (LIPITOR) 20 MG tablet Take 1 tablet (20 mg total) by mouth daily at 6 PM. 08/23/18   Colon Branch, MD  baclofen (LIORESAL) 10 MG tablet Take 1  tablet (10 mg total) by mouth 3 (three) times daily. 01/17/19   Colon Branch, MD  buPROPion (WELLBUTRIN) 100 MG tablet Take 1 tablet (100 mg total) by mouth 2 (two) times daily. 11/07/18   Colon Branch, MD  clopidogrel (PLAVIX) 75 MG tablet Take 1 tablet (75 mg total) by mouth daily. 11/07/18   Colon Branch, MD  escitalopram (LEXAPRO) 20 MG tablet Take 1 tablet (20 mg total) by mouth daily. 11/07/18   Colon Branch, MD  furosemide (LASIX) 20 MG tablet Take 1 tablet (20 mg total) by mouth daily. 08/23/18   Colon Branch, MD  lisinopril (PRINIVIL,ZESTRIL) 2.5 MG tablet Take 1 tablet (2.5 mg total) by mouth daily. 11/02/18   Colon Branch, MD  nebivolol (BYSTOLIC) 5 MG tablet Take 1 tablet (5 mg total) by mouth every 12 (twelve) hours. 05/30/18   Colon Branch, MD  traZODone  (DESYREL) 50 MG tablet Take 1 tablet (50 mg total) by mouth at bedtime. 11/07/18   Colon Branch, MD    Family History Family History  Problem Relation Age of Onset  . Diabetes Mother   . Hypertension Mother   . Glaucoma Mother   . Colon cancer Mother        mother age 43  . Stroke Paternal Grandfather   . Heart disease Other        2 uncles died 06-Apr-2011 from CAD-CHF  . Stroke Maternal Grandfather   . Diabetes Other        siblings  . Hypertension Other        siblings  . Breast cancer Neg Hx     Social History Social History   Tobacco Use  . Smoking status: Never Smoker  . Smokeless tobacco: Never Used  Substance Use Topics  . Alcohol use: No  . Drug use: No     Allergies   Bee venom; Peanut-containing drug products; Hydrocodone; Isovue [iopamidol]; Ambien [zolpidem tartrate]; Aspirin; and Latex   Review of Systems Review of Systems  Constitutional: Negative for chills and fever.  HENT: Negative for congestion and rhinorrhea.   Eyes: Positive for photophobia. Negative for redness and visual disturbance.  Respiratory: Negative for shortness of breath and wheezing.   Cardiovascular: Negative for chest pain and palpitations.  Gastrointestinal: Negative for nausea and vomiting.  Genitourinary: Negative for dysuria and urgency.  Musculoskeletal: Negative for arthralgias and myalgias.  Skin: Negative for pallor and wound.  Neurological: Positive for headaches. Negative for dizziness.     Physical Exam Updated Vital Signs BP 123/88 (BP Location: Left Arm)   Pulse 88   Temp 98.4 F (36.9 C) (Oral)   Resp 16   Ht 4\' 11"  (1.499 m)   Wt 74.8 kg   SpO2 98%   BMI 33.33 kg/m   Physical Exam Vitals signs and nursing note reviewed.  Constitutional:      General: She is not in acute distress.    Appearance: She is well-developed. She is not diaphoretic.  HENT:     Head: Normocephalic and atraumatic.  Eyes:     Pupils: Pupils are equal, round, and reactive to  light.  Neck:     Musculoskeletal: Normal range of motion and neck supple.  Cardiovascular:     Rate and Rhythm: Normal rate and regular rhythm.     Heart sounds: No murmur. No friction rub. No gallop.   Pulmonary:     Effort: Pulmonary effort is normal.     Breath  sounds: No wheezing or rales.  Abdominal:     General: There is no distension.     Palpations: Abdomen is soft.     Tenderness: There is no abdominal tenderness.  Musculoskeletal:        General: No tenderness.  Skin:    General: Skin is warm and dry.  Neurological:     Mental Status: She is alert and oriented to person, place, and time.     Comments: Marketed left-sided weakness, per baseline per patient.  Otherwise no noted neurologic deficits.  Psychiatric:        Behavior: Behavior normal.      ED Treatments / Results  Labs (all labs ordered are listed, but only abnormal results are displayed) Labs Reviewed - No data to display  EKG None  Radiology No results found.  Procedures Procedures (including critical care time)  Medications Ordered in ED Medications  prochlorperazine (COMPAZINE) injection 10 mg (10 mg Intramuscular Given 01/22/19 2034)  diphenhydrAMINE (BENADRYL) injection 25 mg (25 mg Intramuscular Given 01/22/19 2036)  dexamethasone (DECADRON) tablet 10 mg (10 mg Oral Given 01/22/19 2033)  metoCLOPramide (REGLAN) injection 10 mg (10 mg Intramuscular Given 01/22/19 2129)     Initial Impression / Assessment and Plan / ED Course  I have reviewed the triage vital signs and the nursing notes.  Pertinent labs & imaging results that were available during my care of the patient were reviewed by me and considered in my medical decision making (see chart for details).     48 yo F with a chief complaint of a headache.  Feels like her prior migraines.  Neuro exam is at her baseline.  We will give a headache cocktail and reassess.  Patient with some improvement post compazine, requesting additional meds.   Patient given reglan with significant improvement.   D/c home.   9:48 PM:  I have discussed the diagnosis/risks/treatment options with the patient and family and believe the pt to be eligible for discharge home to follow-up with Neuro. We also discussed returning to the ED immediately if new or worsening sx occur. We discussed the sx which are most concerning (e.g., sudden worsening pain, fever, inability to tolerate by mouth) that necessitate immediate return. Medications administered to the patient during their visit and any new prescriptions provided to the patient are listed below.  Medications given during this visit Medications  prochlorperazine (COMPAZINE) injection 10 mg (10 mg Intramuscular Given 01/22/19 2034)  diphenhydrAMINE (BENADRYL) injection 25 mg (25 mg Intramuscular Given 01/22/19 2036)  dexamethasone (DECADRON) tablet 10 mg (10 mg Oral Given 01/22/19 2033)  metoCLOPramide (REGLAN) injection 10 mg (10 mg Intramuscular Given 01/22/19 2129)     The patient appears reasonably screen and/or stabilized for discharge and I doubt any other medical condition or other [facility] requiring further screening, evaluation, or treatment in the ED at this time prior to discharge.    Final Clinical Impressions(s) / ED Diagnoses   Final diagnoses:  Periodic headache syndrome, not intractable    ED Discharge Orders    None       Deno Etienne, DO 01/22/19 2148

## 2019-02-01 ENCOUNTER — Other Ambulatory Visit: Payer: Self-pay | Admitting: Internal Medicine

## 2019-02-07 ENCOUNTER — Telehealth: Payer: Self-pay

## 2019-02-07 ENCOUNTER — Other Ambulatory Visit: Payer: Self-pay | Admitting: Internal Medicine

## 2019-02-07 NOTE — Telephone Encounter (Signed)
PA initiated via Covermymeds; KEY: [id number]. Awaiting determination.

## 2019-02-07 NOTE — Telephone Encounter (Signed)
PA denied. Will appeal.  

## 2019-02-07 NOTE — Telephone Encounter (Signed)
Appeal faxed to [phone]. Awaiting appeal determination.

## 2019-02-08 ENCOUNTER — Other Ambulatory Visit: Payer: Self-pay | Admitting: Internal Medicine

## 2019-02-10 NOTE — Telephone Encounter (Signed)
Approval approved. Effective 02/09/2019 to 02/10/2020.

## 2019-02-15 ENCOUNTER — Encounter: Payer: Self-pay | Admitting: Physical Medicine & Rehabilitation

## 2019-02-15 ENCOUNTER — Encounter: Payer: 59 | Attending: Physical Medicine & Rehabilitation | Admitting: Physical Medicine & Rehabilitation

## 2019-02-15 VITALS — BP 123/85 | HR 80 | Ht 59.0 in | Wt 166.0 lb

## 2019-02-15 DIAGNOSIS — R58 Hemorrhage, not elsewhere classified: Secondary | ICD-10-CM | POA: Insufficient documentation

## 2019-02-15 DIAGNOSIS — I69959 Hemiplegia and hemiparesis following unspecified cerebrovascular disease affecting unspecified side: Secondary | ICD-10-CM | POA: Diagnosis not present

## 2019-02-15 DIAGNOSIS — R252 Cramp and spasm: Secondary | ICD-10-CM | POA: Diagnosis not present

## 2019-02-15 DIAGNOSIS — M6530 Trigger finger, unspecified finger: Secondary | ICD-10-CM

## 2019-02-15 DIAGNOSIS — I69398 Other sequelae of cerebral infarction: Secondary | ICD-10-CM

## 2019-02-15 DIAGNOSIS — I69254 Hemiplegia and hemiparesis following other nontraumatic intracranial hemorrhage affecting left non-dominant side: Secondary | ICD-10-CM | POA: Insufficient documentation

## 2019-02-15 DIAGNOSIS — R269 Unspecified abnormalities of gait and mobility: Secondary | ICD-10-CM | POA: Diagnosis not present

## 2019-02-15 DIAGNOSIS — G43909 Migraine, unspecified, not intractable, without status migrainosus: Secondary | ICD-10-CM | POA: Insufficient documentation

## 2019-02-15 DIAGNOSIS — M65311 Trigger thumb, right thumb: Secondary | ICD-10-CM

## 2019-02-15 DIAGNOSIS — E785 Hyperlipidemia, unspecified: Secondary | ICD-10-CM | POA: Diagnosis not present

## 2019-02-15 DIAGNOSIS — I10 Essential (primary) hypertension: Secondary | ICD-10-CM | POA: Insufficient documentation

## 2019-02-15 DIAGNOSIS — I69154 Hemiplegia and hemiparesis following nontraumatic intracerebral hemorrhage affecting left non-dominant side: Secondary | ICD-10-CM

## 2019-02-15 NOTE — Progress Notes (Signed)
Subjective:    Patient ID: Abigail Campos, female    DOB: January 04, 1971, 48 y.o.   MRN: [id number]  HPI:  Right-handed female with history of hypertension, migraine headaches presents for follow up after right basal ganglia hemorrhage.   Last clinic visit 11/16/2018.  At that time, pt had Dysport injection.  Since that time patient went to the ED with headache, migraine cocktail given, notes reviewed.  Since last visit, patient states she has fallen.    Pain Inventory Average Pain 5 Pain Right Now 5 My pain is constant and dull  In the last 24 hours, has pain interfered with the following? General activity 3 Relation with others 0 Enjoyment of life 3 What TIME of day is your pain at its worst? all Sleep (in general) Poor  Pain is worse with: walking, bending and sitting Pain improves with: pacing activities Relief from Meds: 4  Mobility walk with assistance use a cane ability to climb steps?  yes do you drive?  no Do you have any goals in this area?  yes  Function disabled: date disabled . I need assistance with the following:  meal prep, household duties and shopping  Neuro/Psych bladder control problems weakness numbness tremor trouble walking spasms dizziness confusion depression anxiety  Prior Studies Any changes since last visit?  no  Physicians involved in your care Any changes since last visit?  no   Family History  Problem Relation Age of Onset  . Diabetes Mother   . Hypertension Mother   . Glaucoma Mother   . Colon cancer Mother        mother age 10  . Stroke Paternal Grandfather   . Heart disease Other        2 uncles died 04/05/11 from CAD-CHF  . Stroke Maternal Grandfather   . Diabetes Other        siblings  . Hypertension Other        siblings  . Breast cancer Neg Hx    Social History   Socioeconomic History  . Marital status: Married    Spouse name: Not on file  . Number of children: 2  . Years of education: Not on file  .  Highest education level: Not on file  Occupational History  . Occupation: disable d/t Architectural technologist: [employer]  . Financial resource strain: Not on file  . Food insecurity:    Worry: Not on file    Inability: Not on file  . Transportation needs:    Medical: Not on file    Non-medical: Not on file  Tobacco Use  . Smoking status: Never Smoker  . Smokeless tobacco: Never Used  Substance and Sexual Activity  . Alcohol use: No  . Drug use: No  . Sexual activity: Not on file  Lifestyle  . Physical activity:    Days per week: Not on file    Minutes per session: Not on file  . Stress: Not on file  Relationships  . Social connections:    Talks on phone: Not on file    Gets together: Not on file    Attends religious service: Not on file    Active member of club or organization: Not on file    Attends meetings of clubs or organizations: Not on file    Relationship status: Not on file  Other Topics Concern  . Not on file  Social History Narrative   Used to live independently, stroke 03-2016,  d/c from rehab to his mother's house 04-2016, then moved back w/ her husband as off 04/2018   Daughter finished [facility] [employer], one son, both @ home      Past Surgical History:  Procedure Laterality Date  . ABDOMINAL HYSTERECTOMY  11-14-07   no oophorectomy per surgical report  . CESAREAN SECTION     [id number]  . INGUINAL HERNIA REPAIR     2004   Past Medical History:  Diagnosis Date  . Essential hypertension 10/19/2007   03-2010: metoprolol changed to bystolic (was not feeling well on it, no specific allergy or reaction)    . Hyperlipemia   . Hypertension   . Stroke (HCC)    BP 123/85   Pulse 80   Ht 4\' 11"  (1.499 m)   Wt 166 lb (75.3 kg)   SpO2 93%   BMI 33.53 kg/m   Opioid Risk Score:   Fall Risk Score:  `1  Depression screen PHQ 2/9  Depression screen [facility] 2/9 08/05/2018 01/28/2018 08/13/2016 06/10/2016 05/15/2016  Decreased Interest 0 0 0 0 0  Down,  Depressed, Hopeless 0 0 0 0 1  PHQ - 2 Score 0 0 0 0 1  Altered sleeping - - - - 1  Tired, decreased energy - - - - 3  Change in appetite - - - - 0  Feeling bad or failure about yourself  - - - - 0  Trouble concentrating - - - - 0  Moving slowly or fidgety/restless - - - - 0  Suicidal thoughts - - - - 0  PHQ-9 Score - - - - 5  Some recent data might be hidden   Review of Systems  Musk: Gait abnormality Neurological: Positive for  weakness.  All other systems reviewed and are negative.     Objective:     Physical Exam Constitutional: She appears well-developed.  NAD HENT:  Normocephalic and atraumatic.   Eyes: EOM are normal.  No discharge. Cardiovascular: RRR. No JVD. Respiratory: Effort normal and breath sounds normal. No respiratory distress.   GI: Bowel sounds are normal. She exhibits no distension.  Musculoskeletal: No edema or tenderness in extremities Gait: Hemiplegic Neurological: She is Alert and oriented 3.  Mild dysarthria  Left facial weakness Motor: RUE/RLE: 5/5 proximal to distal.   LLE: Hip flexion 4/5, knee extension 4/5, ADF/PF 1/5 LUE: Shoulder abduction 0/5, elbow flex 0/5, elbow ext 0/5, hand grip 0/5.  Modified Ashworth: Left 1/4 elbow flexors, wrist flexors 1+/5, 3/4 left finger flexors, 2/4 ankle plantar flexors.  Skin: Skin is warm and dry.  Psychiatric: Normal mood and behavior    Assessment & Plan:  Right-handed female with history of hypertension, migraine headaches presents for follow up after right basal ganglia hemorrhage.    1. 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 affecting left nondominant side  Cont meds  Cont Follow up Neurology  Spastic hemiplegia affecting left non-dominant side  Have had lengthy discussion with patient regarding treatment options for spasticity  Cont baclofen 10mg  TID (pt does not wish to increase due to side effects)  Will change Dysport injection, pt would like to continue at decreased  dose:    Left Med biceps: Cont 100 units   Left FCR: Cont 100 units   Left FCU: Cont 100 units   Left FDS: Increase back to 200 units   Left FDP: Increase back to 200 units    Left Med gastroc: Resume 150 units again   Left Lat gastroc:  Resume 150 units again   Cont HEP  Cont WHO   2. Gait abnormality  Cont quad cane  Cont Therapies   3. Right trigger thumb  Improved with no locking  Overuse injury as well with positioning  Educated again, figure 8 splint   Pt to consider surgery referral in future if necessary  Will schedule for injection  4. Sleep disturbance  Trial Melatonin   >25 minutes spent with patient, with >20 in counseling regarding spasticity, prognosis, injections, doses, and differences

## 2019-02-22 ENCOUNTER — Encounter (HOSPITAL_BASED_OUTPATIENT_CLINIC_OR_DEPARTMENT_OTHER): Payer: 59 | Admitting: Physical Medicine & Rehabilitation

## 2019-02-22 ENCOUNTER — Encounter: Payer: Self-pay | Admitting: Physical Medicine & Rehabilitation

## 2019-02-22 ENCOUNTER — Other Ambulatory Visit: Payer: Self-pay

## 2019-02-22 VITALS — BP 110/76 | HR 78 | Ht 59.0 in | Wt 166.0 lb

## 2019-02-22 DIAGNOSIS — I69154 Hemiplegia and hemiparesis following nontraumatic intracerebral hemorrhage affecting left non-dominant side: Secondary | ICD-10-CM

## 2019-02-22 DIAGNOSIS — R252 Cramp and spasm: Secondary | ICD-10-CM | POA: Diagnosis not present

## 2019-02-22 DIAGNOSIS — I10 Essential (primary) hypertension: Secondary | ICD-10-CM | POA: Diagnosis not present

## 2019-02-22 DIAGNOSIS — E785 Hyperlipidemia, unspecified: Secondary | ICD-10-CM | POA: Diagnosis not present

## 2019-02-22 DIAGNOSIS — I69254 Hemiplegia and hemiparesis following other nontraumatic intracranial hemorrhage affecting left non-dominant side: Secondary | ICD-10-CM | POA: Diagnosis not present

## 2019-02-22 DIAGNOSIS — R58 Hemorrhage, not elsewhere classified: Secondary | ICD-10-CM | POA: Diagnosis not present

## 2019-02-22 DIAGNOSIS — G43909 Migraine, unspecified, not intractable, without status migrainosus: Secondary | ICD-10-CM | POA: Diagnosis not present

## 2019-02-22 NOTE — Progress Notes (Signed)
Dysport: Procedure Note Patient Name: Abigail Campos DOB: June 29, 1971 MRN: [id number] Date: 02/22/2019  Procedure: Botulinum toxin administration Guidance: EMG Diagnosis: Spastic left nondominant IPH Attending: Delice Lesch, MD   Informed consent: Risks, benefits & options of the procedure are explained to the patient (and/or family). The patient elects to proceed with procedure. Risks include but are not limited to weakness, respiratory distress, dry mouth, ptosis, antibody formation, worsening of some areas of function. Benefits include decreased abnormal muscle tone, improved hygiene and positioning, decreased skin breakdown and, in some cases, decreased pain. Options include conservative management with oral antispasticity agents, phenol chemodenervation of nerve or at motor nerve branches. More invasive options include intrathecal balcofen adminstration for appropriate candidates. Surgical options may include tendon lengthening or transposition or, rarely, dorsal rhizotomy.   History/Physical Examination: 48 y.o.  Year old right-handed female with history of hypertension, migraine headaches presents for follow up after right basal ganglia hemorrhage.   Modified Ashworth: Left 1/4 elbow flexors, 2/4 wrist flexors, 3/4 left finger flexors, 1+/4 ankle plantar flexors.  Previous Treatments: Therapy/Range of motion Indication for guidance: Target active muscules  Procedure: Botulinum toxin was mixed with preservative free saline with a dilution of 0.5cc to 100 units. Targeted limb and muscles were identified. The skin was prepped with alcohol swabs and placement of needle tip in targeted muscle was confirmed using appropriate guidance. Prior to injection, positioning of needle tip outside of blood vessel was determined by pulling back on syringe plunger.  MUSCLE UNITS  Left Med biceps: 100 units Left FCR: 100 units Left FCU: 100 units Left FDS: 200 units Left FDP: 200 units  Left Med  gastroc: 150 units  Left Lat gastroc: 150 units   Total units: 5947  Complications: None  Plan: Follow up in 6 weeks  Ankit Anil Patel 11:44 AM

## 2019-02-24 ENCOUNTER — Ambulatory Visit: Payer: 59 | Admitting: Internal Medicine

## 2019-02-27 ENCOUNTER — Ambulatory Visit: Payer: 59 | Admitting: Internal Medicine

## 2019-02-27 ENCOUNTER — Ambulatory Visit (INDEPENDENT_AMBULATORY_CARE_PROVIDER_SITE_OTHER): Payer: Medicare Other | Admitting: Family Medicine

## 2019-02-27 ENCOUNTER — Other Ambulatory Visit: Payer: Self-pay

## 2019-02-27 ENCOUNTER — Encounter: Payer: Self-pay | Admitting: Family Medicine

## 2019-02-27 VITALS — BP 112/82 | HR 91 | Temp 98.5°F | Ht 59.0 in | Wt 170.2 lb

## 2019-02-27 DIAGNOSIS — I10 Essential (primary) hypertension: Secondary | ICD-10-CM

## 2019-02-27 DIAGNOSIS — G8110 Spastic hemiplegia affecting unspecified side: Secondary | ICD-10-CM | POA: Diagnosis not present

## 2019-02-27 DIAGNOSIS — Z8673 Personal history of transient ischemic attack (TIA), and cerebral infarction without residual deficits: Secondary | ICD-10-CM | POA: Diagnosis not present

## 2019-02-27 DIAGNOSIS — R7303 Prediabetes: Secondary | ICD-10-CM | POA: Diagnosis not present

## 2019-02-27 DIAGNOSIS — E785 Hyperlipidemia, unspecified: Secondary | ICD-10-CM | POA: Diagnosis not present

## 2019-02-27 DIAGNOSIS — G4700 Insomnia, unspecified: Secondary | ICD-10-CM | POA: Diagnosis not present

## 2019-02-27 LAB — COMPREHENSIVE METABOLIC PANEL
ALT: 24 U/L (ref 0–35)
AST: 18 U/L (ref 0–37)
Albumin: 4.1 g/dL (ref 3.5–5.2)
Alkaline Phosphatase: 87 U/L (ref 39–117)
BUN: 10 mg/dL (ref 6–23)
CO2: 30 mEq/L (ref 19–32)
Calcium: 9.2 mg/dL (ref 8.4–10.5)
Chloride: 100 mEq/L (ref 96–112)
Creatinine, Ser: 0.93 mg/dL (ref 0.40–1.20)
GFR: 78.77 mL/min (ref >60.00)
Glucose, Bld: 78 mg/dL (ref 70–99)
Potassium: 4 mEq/L (ref 3.5–5.1)
Sodium: 136 mEq/L (ref 135–145)
Total Bilirubin: 0.4 mg/dL (ref 0.2–1.2)
Total Protein: 7.2 g/dL (ref 6.0–8.3)

## 2019-02-27 LAB — LIPID PANEL
Cholesterol: 120 mg/dL (ref 0–200)
HDL: 46.9 mg/dL (ref >39.00)
LDL Cholesterol: 61 mg/dL (ref 0–99)
NonHDL: 73.1
Total CHOL/HDL Ratio: 3
Triglycerides: 61 mg/dL (ref 0.0–149.0)
VLDL: 12.2 mg/dL (ref 0.0–40.0)

## 2019-02-27 LAB — HEMOGLOBIN A1C: Hgb A1c MFr Bld: 6.2 % (ref 4.6–6.5)

## 2019-02-27 MED ORDER — TRAZODONE HCL 100 MG PO TABS: 100.0000 mg | ORAL_TABLET | Freq: Every evening | ORAL | 2 refills | Status: DC | PRN

## 2019-02-27 NOTE — Patient Instructions (Addendum)
If you do not hear anything about your referral in the next 1-2 weeks, call our office and ask for an update.  Keep the diet clean and stay active.  Aim to do some physical exertion for 150 minutes per week. This is typically divided into 5 days per week, 30 minutes per day. The activity should be enough to get your heart rate up. Anything is better than nothing if you have time constraints.  Sleep Hygiene Tips:  Do not watch TV or look at screens within 1 hour of going to bed. If you do, make sure there is a blue light filter (nighttime mode) involved.  Try to go to bed around the same time every night. Wake up at the same time within 1 hour of regular time. Ex: If you wake up at 7 AM for work, do not sleep past 8 AM on days that you don't work.  Do not drink alcohol before bedtime.  Do not consume caffeine-containing beverages after noon or within 9 hours of intended bedtime.  Get regular exercise/physical activity in your life, but not within 2 hours of planned bedtime.  Do not take naps.   Do not eat within 2 hours of planned bedtime.  Melatonin, 3-5 mg 30-60 minutes before planned bedtime may be helpful.   The bed should be for sleep or sex only. If after 20-30 minutes you are unable to fall asleep, get up and do something relaxing. Do this until you feel ready to go to sleep again.   Let us know if you need anything.

## 2019-02-27 NOTE — Progress Notes (Signed)
Chief Complaint  Patient presents with  . Follow-up    Subjective Abigail Campos is a 48 y.o. female who presents for hypertension follow up. She does monitor home blood pressures. Blood pressures ranging from 110's/80's on average. She is compliant with medications- Bystolic 5 mg bid, lisinopril 2.5 mg/d. $0N copay for Bystolic, got a coupon but only has 3 mo left. Wondering what to after it runs out.  Patient has these side effects of medication: none She is adhering to a healthy diet overall. Current exercise: walking  Sleep is harder to come by even though on Trazodone. Has failed Ambien and Restoril.  Does follow with a counselor/psychologist. She is doing well on Lexapro and Wellbutrin with no AE's.   Baclofen working well for spasticity.    Past Medical History:  Diagnosis Date  . Essential hypertension 10/19/2007   03-2010: metoprolol changed to bystolic (was not feeling well on it, no specific allergy or reaction)    . Hyperlipemia   . Hypertension   . Stroke [facility])     Review of Systems Cardiovascular: no chest pain Respiratory:  no shortness of breath  Exam BP 112/82 (BP Location: Left Arm, Patient Position: Sitting, Cuff Size: Normal)   Pulse 91   Temp 98.5 F (36.9 C) (Oral)   Ht 4\' 11"  (1.499 m)   Wt 170 lb 4 oz (77.2 kg)   SpO2 98%   BMI 34.39 kg/m  General:  well developed, well nourished, in no apparent distress Heart: RRR, no bruits, no LE edema Lungs: clear to auscultation, no accessory muscle use Psych: well oriented with normal range of affect and appropriate judgment/insight  Essential hypertension - Plan: Comprehensive metabolic panel  Insomnia, unspecified type - Plan: Ambulatory referral to Pulmonology  Spastic hemiparesis affecting dominant side (HCC)  Prediabetes - Plan: Hemoglobin A1c  History of stroke  Hyperlipidemia, unspecified hyperlipidemia type - Plan: Lipid panel  Orders as above. Cont current meds, likely will need to  change to Coreg or something cheaper, may need to also increase ACEi dosage. Counseled on diet and exercise. Refer to pulm for OSA eval. Increase dose of trazodone to 100 mg/d.  F/u in 1 mo to reck sleep w reg PCP. The patient voiced understanding and agreement to the plan.  [name], DO 02/27/19  2:41 PM

## 2019-03-13 ENCOUNTER — Ambulatory Visit: Payer: Self-pay | Admitting: Physical Medicine & Rehabilitation

## 2019-03-23 ENCOUNTER — Encounter: Payer: 59 | Attending: Physical Medicine & Rehabilitation | Admitting: Physical Medicine & Rehabilitation

## 2019-03-23 DIAGNOSIS — E785 Hyperlipidemia, unspecified: Secondary | ICD-10-CM | POA: Insufficient documentation

## 2019-03-23 DIAGNOSIS — G43909 Migraine, unspecified, not intractable, without status migrainosus: Secondary | ICD-10-CM | POA: Insufficient documentation

## 2019-03-23 DIAGNOSIS — R252 Cramp and spasm: Secondary | ICD-10-CM | POA: Insufficient documentation

## 2019-03-23 DIAGNOSIS — I10 Essential (primary) hypertension: Secondary | ICD-10-CM | POA: Insufficient documentation

## 2019-03-23 DIAGNOSIS — I69254 Hemiplegia and hemiparesis following other nontraumatic intracranial hemorrhage affecting left non-dominant side: Secondary | ICD-10-CM | POA: Insufficient documentation

## 2019-03-23 DIAGNOSIS — R58 Hemorrhage, not elsewhere classified: Secondary | ICD-10-CM | POA: Insufficient documentation

## 2019-03-27 ENCOUNTER — Ambulatory Visit: Payer: Self-pay | Admitting: Physical Medicine & Rehabilitation

## 2019-03-28 ENCOUNTER — Other Ambulatory Visit: Payer: Self-pay | Admitting: Family Medicine

## 2019-03-29 ENCOUNTER — Ambulatory Visit (INDEPENDENT_AMBULATORY_CARE_PROVIDER_SITE_OTHER): Payer: Medicare Other | Admitting: Internal Medicine

## 2019-03-29 ENCOUNTER — Other Ambulatory Visit: Payer: Self-pay

## 2019-03-29 DIAGNOSIS — I10 Essential (primary) hypertension: Secondary | ICD-10-CM | POA: Diagnosis not present

## 2019-03-29 DIAGNOSIS — F419 Anxiety disorder, unspecified: Secondary | ICD-10-CM

## 2019-03-29 DIAGNOSIS — R059 Cough, unspecified: Secondary | ICD-10-CM

## 2019-03-29 DIAGNOSIS — R739 Hyperglycemia, unspecified: Secondary | ICD-10-CM

## 2019-03-29 DIAGNOSIS — F32A Depression, unspecified: Secondary | ICD-10-CM

## 2019-03-29 DIAGNOSIS — F329 Major depressive disorder, single episode, unspecified: Secondary | ICD-10-CM

## 2019-03-29 DIAGNOSIS — R05 Cough: Secondary | ICD-10-CM | POA: Diagnosis not present

## 2019-03-29 NOTE — Progress Notes (Signed)
Subjective:    Patient ID: Abigail Campos, female    DOB: 09-25-1971, 48 y.o.   MRN: [id number]  DOS:  03/29/2019 Type of visit - description: Virtual Visit via Video Note  I connected with@ on 03/30/19 at  1:00 PM EDT by a video enabled telemedicine application and verified that I am speaking with the correct person using two identifiers.   THIS ENCOUNTER IS A VIRTUAL VISIT DUE TO COVID-19 - PATIENT WAS NOT SEEN IN THE OFFICE. PATIENT HAS CONSENTED TO VIRTUAL VISIT / TELEMEDICINE VISIT   Location of patient: home  Location of provider: office  I discussed the limitations of evaluation and management by telemedicine and the availability of in person appointments. The patient expressed understanding and agreed to proceed.  History of Present Illness: Follow-up Since the last time she was here, was seen by Dr. Nani Ravens, main concern was insomnia, trazodone dose increase, she is doing better. Was referred to pulmonology but OV is pending. She also reports a cough, mostly at night, started 2 weeks ago.  Cough is dry.    Review of Systems Denies fever chills No sinus congestion No allergy sxs such as a sneezing, itchy or watery eyes. No difficulty breathing. Some chest pain only at the time of cough.  Past Medical History:  Diagnosis Date  . Essential hypertension 10/19/2007   03-2010: metoprolol changed to bystolic (was not feeling well on it, no specific allergy or reaction)    . Hyperlipemia   . Hypertension   . Stroke [facility])     Past Surgical History:  Procedure Laterality Date  . ABDOMINAL HYSTERECTOMY  11-14-07   no oophorectomy per surgical report  . CESAREAN SECTION     [id number]  . INGUINAL HERNIA REPAIR     2004    Social History   Socioeconomic History  . Marital status: Married    Spouse name: Not on file  . Number of children: 2  . Years of education: Not on file  . Highest education level: Not on file  Occupational History  . Occupation: disable  d/t Architectural technologist: [employer]  . Financial resource strain: Not on file  . Food insecurity:    Worry: Not on file    Inability: Not on file  . Transportation needs:    Medical: Not on file    Non-medical: Not on file  Tobacco Use  . Smoking status: Never Smoker  . Smokeless tobacco: Never Used  Substance and Sexual Activity  . Alcohol use: No  . Drug use: No  . Sexual activity: Not on file  Lifestyle  . Physical activity:    Days per week: Not on file    Minutes per session: Not on file  . Stress: Not on file  Relationships  . Social connections:    Talks on phone: Not on file    Gets together: Not on file    Attends religious service: Not on file    Active member of club or organization: Not on file    Attends meetings of clubs or organizations: Not on file    Relationship status: Not on file  . Intimate partner violence:    Fear of current or ex partner: Not on file    Emotionally abused: Not on file    Physically abused: Not on file    Forced sexual activity: Not on file  Other Topics Concern  . Not on file  Social History Narrative  Used to live independently, stroke 03-2016, d/c from rehab to his mother's house 04-2016, then moved back w/ her husband as off 04/2018   Daughter finished [employer] [facility], one son, both @ home         Allergies as of 03/29/2019      Reactions   Bee Venom Anaphylaxis   Peanut-containing Drug Products Anaphylaxis   Hydrocodone Hives   Generalize itching w/o rash   Isovue [iopamidol] Hives, Itching   Pt broke out with one facial hive.  Had itching on her right upper ant and posterior arm w/o evidence of hives.  Pt given water and 25mg  benadryl po.  We observed pt for 30 minutes before d/c.  J. Bohm     Ambien [zolpidem Tartrate] Other (See Comments)   forgetfulness   Aspirin Swelling   Reports blisters w/ ASA but pt reports is ok w/ Motrin, Advil, naproxen   Latex Hives      Medication List        Accurate as of March 29, 2019 11:59 PM. Always use your most recent med list.        atorvastatin 20 MG tablet Commonly known as:  LIPITOR Take 1 tablet (20 mg total) by mouth daily at 6 PM.   baclofen 10 MG tablet Commonly known as:  LIORESAL Take 1 tablet (10 mg total) by mouth 3 (three) times daily.   buPROPion 100 MG tablet Commonly known as:  WELLBUTRIN Take 1 tablet (100 mg total) by mouth 2 (two) times daily.   Bystolic 5 MG tablet Generic drug:  nebivolol Take 1 tablet (5 mg total) by mouth every 12 (twelve) hours.   clopidogrel 75 MG tablet Commonly known as:  PLAVIX Take 1 tablet (75 mg total) by mouth daily.   escitalopram 20 MG tablet Commonly known as:  LEXAPRO Take 1 tablet (20 mg total) by mouth daily.   furosemide 20 MG tablet Commonly known as:  LASIX Take 1 tablet (20 mg total) by mouth daily.   lisinopril 2.5 MG tablet Commonly known as:  PRINIVIL,ZESTRIL Take 1 tablet (2.5 mg total) by mouth daily.   traZODone 100 MG tablet Commonly known as:  DESYREL Take 1 tablet (100 mg total) by mouth at bedtime as needed for sleep.           Objective:   Physical Exam There were no vitals taken for this visit. This is a video visit, alert oriented x3, seems at baseline.    Assessment     Assessment   Prediabetes: A1c 6.0 (April 2017)  HTN Hyperlipidemia Depression, insomnia GERD Stroke 03-2016:  ICH, right basilar ganglia due to HTN emergency, + encephalopathy, + dysphagia, aphasia, dysarthria Residual L spasticity.  CTA neck 03-2016 neg CTA head 10-17 (-) aneurysm Headaches , migraines dx after 2nd pregnancy, (-) CT head 2014 and 2015 Insomnia   Cough, persisting, saw Dr. Melvyn Novas 11-2015  PLAN: Prediabetes: Recent A1c 6.2, diet controlled.  Doing well. HTN: Reportedly Bystolic, lisinopril, Lasix.  Recent BMP satisfactory, no change.  Bystolic might not be affordable in few weeks, encouraged her to call me before she ran out so we can switch to  something different.  Carvedilol?. Depression, anxiety, insomnia: Recently saw Dr. Nani Ravens, trazodone dose increased to 100 mg for better control of insomnia and is working well.  Will continue Wellbutrin, Lexapro.  RF as needed Cough: With no fever, chills.  ROS essentially negative.  Recommend observation for now, Robitussin-DM, call if the cough persists for the  next 2 to 3 weeks, stop lisinopril and start ARB is?   RTC 3 to 4  months, will ask my staff to schedule     I discussed the assessment and treatment plan with the patient. The patient was provided an opportunity to ask questions and all were answered. The patient agreed with the plan and demonstrated an understanding of the instructions.   The patient was advised to call back or seek an in-person evaluation if the symptoms worsen or if the condition fails to improve as anticipated.

## 2019-03-30 ENCOUNTER — Telehealth: Payer: Self-pay | Admitting: Internal Medicine

## 2019-03-30 NOTE — Telephone Encounter (Signed)
LVM for pt to call back to schedule a f/u appt in 3-4 months from 03/30/19.

## 2019-03-30 NOTE — Telephone Encounter (Signed)
-----   Message from Hinton Dyer, [place] sent at 03/30/2019  9:49 AM EDT ----- Regarding: FW: Appointment  ----- Message ----- From: Colon Branch, MD Sent: 03/30/2019   9:39 AM EDT To: Colon Branch, MD, Hinton Dyer, CMA Subject: Appointment                                    Please call the patient and schedule routine office visit 3 to 4 months from today

## 2019-03-30 NOTE — Assessment & Plan Note (Signed)
Prediabetes: Recent A1c 6.2, diet controlled.  Doing well. HTN: Reportedly Bystolic, lisinopril, Lasix.  Recent BMP satisfactory, no change.  Bystolic might not be affordable in few weeks, encouraged her to call me before she ran out so we can switch to something different.  Carvedilol?. Depression, anxiety, insomnia: Recently saw Dr. Nani Ravens, trazodone dose increased to 100 mg for better control of insomnia and is working well.  Will continue Wellbutrin, Lexapro.  RF as needed Cough: With no fever, chills.  ROS essentially negative.  Recommend observation for now, Robitussin-DM, call if the cough persists for the next 2 to 3 weeks, stop lisinopril and start ARB is?   RTC 3 to 4  months, will ask my staff to schedule

## 2019-04-06 ENCOUNTER — Encounter: Payer: Self-pay | Admitting: Physical Medicine & Rehabilitation

## 2019-04-06 ENCOUNTER — Other Ambulatory Visit: Payer: Self-pay

## 2019-04-06 ENCOUNTER — Ambulatory Visit: Payer: Self-pay | Admitting: Physical Medicine & Rehabilitation

## 2019-04-06 ENCOUNTER — Encounter (HOSPITAL_BASED_OUTPATIENT_CLINIC_OR_DEPARTMENT_OTHER): Payer: 59 | Admitting: Physical Medicine & Rehabilitation

## 2019-04-06 VITALS — BP 110/72 | HR 89 | Ht 59.0 in | Wt 175.0 lb

## 2019-04-06 DIAGNOSIS — R269 Unspecified abnormalities of gait and mobility: Secondary | ICD-10-CM

## 2019-04-06 DIAGNOSIS — I69154 Hemiplegia and hemiparesis following nontraumatic intracerebral hemorrhage affecting left non-dominant side: Secondary | ICD-10-CM

## 2019-04-06 DIAGNOSIS — I69959 Hemiplegia and hemiparesis following unspecified cerebrovascular disease affecting unspecified side: Secondary | ICD-10-CM

## 2019-04-06 DIAGNOSIS — I69398 Other sequelae of cerebral infarction: Secondary | ICD-10-CM

## 2019-04-06 DIAGNOSIS — M65311 Trigger thumb, right thumb: Secondary | ICD-10-CM

## 2019-04-06 DIAGNOSIS — G479 Sleep disorder, unspecified: Secondary | ICD-10-CM

## 2019-04-06 NOTE — Progress Notes (Signed)
Subjective:    Patient ID: Abigail Campos, female    DOB: [birth year], 48 y.o.   MRN: [id number]  TELEHEALTH NOTE  Due to national recommendations of social distancing due to COVID 19, an audio/video telehealth visit is felt to be most appropriate for this patient at this time.  See Chart message from today for the patient's consent to telehealth from [place].     I verified that I am speaking with the correct person using two identifiers.  Location of patient: Home Location of provider: Office Method of communication: Telephone Names of participants : Zorita Pang scheduling, Kennon Rounds obtaining consent and vitals if available Established patient Time spent on call: 13 minutes  HPI  Right-handed female with history of hypertension, migraine headaches presents for follow up after right basal ganglia hemorrhage.    Last clinic visit on 02/22/2019.  At that time, she had Dysport injection.  She was scheduled to have a trigger thumb injection afterward but due to CoVid that has been postponed.  Since the injection she states, she states continued "heaviness in her arm" with some decreased tightness, but continues to have tightness.  She notes improvement in her foot. She continues to take Baclofen.  Denies falls. Therapies are on hold. She continues to have pain in her thumb. She was prescribed trazodone by per PCP.   Pain Inventory Average Pain 0 Pain Right Now 0 My pain is na  In the last 24 hours, has pain interfered with the following? General activity 0 Relation with others 0 Enjoyment of life 0 What TIME of day is your pain at its worst? na Sleep (in general) Poor  Pain is worse with: na Pain improves with: na Relief from Meds: na  Mobility use a cane ability to climb steps?  yes do you drive?  no Right-handed female with history of hypertension, migraine headaches presents for follow up after right basal ganglia hemorrhage.    Last clinic visit 11/16/2018 Function disabled: date disabled na  Neuro/Psych weakness numbness trouble walking spasms depression  Prior Studies Any changes since last visit?  no  Physicians involved in your care Any changes since last visit?  no Primary care Dr. Larose Kells   Family History  Problem Relation Age of Onset  . Diabetes Mother   . Hypertension Mother   . Glaucoma Mother   . Colon cancer Mother        mother age 84  . Stroke Paternal Grandfather   . Heart disease Other        2 uncles died 2011-03-15 from CAD-CHF  . Stroke Maternal Grandfather   . Diabetes Other        siblings  . Hypertension Other        siblings  . Breast cancer Neg Hx    Social History   Socioeconomic History  . Marital status: Married    Spouse name: Not on file  . Number of children: 2  . Years of education: Not on file  . Highest education level: Not on file  Occupational History  . Occupation: disable d/t Architectural technologist: [employer]  . Financial resource strain: Not on file  . Food insecurity:    Worry: Not on file    Inability: Not on file  . Transportation needs:    Medical: Not on file    Non-medical: Not on file  Tobacco Use  . Smoking status: Never Smoker  .  Smokeless tobacco: Never Used  Substance and Sexual Activity  . Alcohol use: No  . Drug use: No  . Sexual activity: Not on file  Lifestyle  . Physical activity:    Days per week: Not on file    Minutes per session: Not on file  . Stress: Not on file  Relationships  . Social connections:    Talks on phone: Not on file    Gets together: Not on file    Attends religious service: Not on file    Active member of club or organization: Not on file    Attends meetings of clubs or organizations: Not on file    Relationship status: Not on file  Other Topics Concern  . Not on file  Social History Narrative   Used to live independently, stroke 03-2016, d/c from rehab to his mother's house  04-2016, then moved back w/ her husband as off 04/2018   Daughter finished [employer], one son, both @ home      Past Surgical History:  Procedure Laterality Date  . ABDOMINAL HYSTERECTOMY  11-14-07   no oophorectomy per surgical report  . CESAREAN SECTION     [id number]  . INGUINAL HERNIA REPAIR     2004   Past Medical History:  Diagnosis Date  . Essential hypertension 10/19/2007   03-2010: metoprolol changed to bystolic (was not feeling well on it, no specific allergy or reaction)    . Hyperlipemia   . Hypertension   . Stroke (HCC)    BP 110/72   Pulse 89   Ht 4\' 11"  (1.499 m)   Wt 175 lb (79.4 kg)   BMI 35.35 kg/m   Opioid Risk Score:   Fall Risk Score:  `1  Depression screen PHQ 2/9  Depression screen [facility] 2/9 04/06/2019 08/05/2018 01/28/2018 08/13/2016 06/10/2016 05/15/2016  Decreased Interest 1 0 0 0 0 0  Down, Depressed, Hopeless 1 0 0 0 0 1  PHQ - 2 Score 2 0 0 0 0 1  Altered sleeping - - - - - 1  Tired, decreased energy - - - - - 3  Change in appetite - - - - - 0  Feeling bad or failure about yourself  - - - - - 0  Trouble concentrating - - - - - 0  Moving slowly or fidgety/restless - - - - - 0  Suicidal thoughts - - - - - 0  PHQ-9 Score - - - - - 5  Some recent data might be hidden   Review of Systems  Constitutional: Positive for diaphoresis and unexpected weight change.  HENT: Positive for tinnitus.   Eyes: Negative.   Respiratory: Negative.   Cardiovascular: Negative.   Gastrointestinal: Negative.   Endocrine: Negative.   Genitourinary: Negative.   Musculoskeletal: Positive for myalgias.  Allergic/Immunologic: Negative.   Neurological: Positive for weakness and numbness. Negative for dizziness.  Hematological: Negative.   Psychiatric/Behavioral: Negative.   All other systems reviewed and are negative.     Objective:   Physical Exam Gen: NAD. Pulm: Effort normal Neuro: Alert and oriented    Assessment & Plan:  Right-handed female with history of  hypertension, migraine headaches presents for follow up after right basal ganglia hemorrhage.    1. 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 affecting left nondominant side             Cont meds             Cont Follow  up Neurology             Spastic hemiplegia affecting left non-dominant side             Have had lengthy discussion with patient regarding treatment options for spasticity             Cont baclofen 10mg  TID (pt does not wish to increase due to side effects)             Cont Dysport injection: Will cont for time being                         Left Med biceps: Cont 100 units                         Left FCR: Cont 100 units                         Left FCU: Cont 100 units                         Left FDS: Increase back to 200 units                         Left FDP: Increase back to 200 units                          Left Med gastroc:Resume 150 units again                         Left Lat gastroc: Resume 150 units again              Cont HEP             Cont WHO   2. Gait abnormality             Cont quad cane             Cont Therapies when able  3. Right trigger thumb             Improved with no locking             Overuse injury as well with positioning             Educated again, figure 8 splint              Pt to consider surgery referral in future if necessary             Will schedule for injection - pt would like hold off until after CoVid  4. Sleep disturbance             Trazodone ordered by PCP  Sleep study ordered by PCP  Consider trial Melatonin

## 2019-05-04 ENCOUNTER — Other Ambulatory Visit: Payer: Self-pay

## 2019-05-04 ENCOUNTER — Encounter: Payer: 59 | Attending: Physical Medicine & Rehabilitation | Admitting: Physical Medicine & Rehabilitation

## 2019-05-04 ENCOUNTER — Encounter: Payer: Self-pay | Admitting: Physical Medicine & Rehabilitation

## 2019-05-04 VITALS — BP 126/80 | HR 76 | Temp 98.5°F | Ht 59.0 in | Wt 173.0 lb

## 2019-05-04 DIAGNOSIS — R252 Cramp and spasm: Secondary | ICD-10-CM | POA: Diagnosis not present

## 2019-05-04 DIAGNOSIS — G43909 Migraine, unspecified, not intractable, without status migrainosus: Secondary | ICD-10-CM | POA: Insufficient documentation

## 2019-05-04 DIAGNOSIS — M65311 Trigger thumb, right thumb: Secondary | ICD-10-CM | POA: Diagnosis not present

## 2019-05-04 DIAGNOSIS — I10 Essential (primary) hypertension: Secondary | ICD-10-CM | POA: Insufficient documentation

## 2019-05-04 DIAGNOSIS — R58 Hemorrhage, not elsewhere classified: Secondary | ICD-10-CM | POA: Diagnosis not present

## 2019-05-04 DIAGNOSIS — E785 Hyperlipidemia, unspecified: Secondary | ICD-10-CM | POA: Diagnosis not present

## 2019-05-04 DIAGNOSIS — I69254 Hemiplegia and hemiparesis following other nontraumatic intracranial hemorrhage affecting left non-dominant side: Secondary | ICD-10-CM | POA: Insufficient documentation

## 2019-05-04 NOTE — Progress Notes (Signed)
Stenosing tenosynovitis injection  Indication: Trigger thumb pain not relieved by conservative care.  Informed consent was obtained after describing risks and benefits of the procedure with the patient, this includes bleeding, bruising, infection and medication side effects. The patient wishes to proceed and has given written consent. Patient was placed in a seated position with volar hand facing up. The right first digit A1 pulley was marked and prepped with betadine. Vapocoolant spray was applied. A 27-gauge 1-1/2 inch needle was inserted, directed into the tendon and then slightly withdrawn.  After negative draw back for blood, a 0.5 ml solution containing 0.25 mL of 6 mg per ML celestone and 0.25 mL of 2% lidocaine was injected. A band aid was applied. The patient tolerated the procedure well. Post procedure instructions were given.

## 2019-05-19 ENCOUNTER — Other Ambulatory Visit: Payer: Self-pay | Admitting: Internal Medicine

## 2019-05-29 ENCOUNTER — Encounter: Payer: 59 | Attending: Physical Medicine & Rehabilitation | Admitting: Physical Medicine & Rehabilitation

## 2019-05-29 DIAGNOSIS — R58 Hemorrhage, not elsewhere classified: Secondary | ICD-10-CM | POA: Insufficient documentation

## 2019-05-29 DIAGNOSIS — I10 Essential (primary) hypertension: Secondary | ICD-10-CM | POA: Insufficient documentation

## 2019-05-29 DIAGNOSIS — E785 Hyperlipidemia, unspecified: Secondary | ICD-10-CM | POA: Insufficient documentation

## 2019-05-29 DIAGNOSIS — I69254 Hemiplegia and hemiparesis following other nontraumatic intracranial hemorrhage affecting left non-dominant side: Secondary | ICD-10-CM | POA: Insufficient documentation

## 2019-05-29 DIAGNOSIS — G43909 Migraine, unspecified, not intractable, without status migrainosus: Secondary | ICD-10-CM | POA: Insufficient documentation

## 2019-05-29 DIAGNOSIS — R252 Cramp and spasm: Secondary | ICD-10-CM | POA: Insufficient documentation

## 2019-06-05 ENCOUNTER — Other Ambulatory Visit: Payer: Self-pay | Admitting: Internal Medicine

## 2019-06-21 ENCOUNTER — Other Ambulatory Visit: Payer: Self-pay | Admitting: Internal Medicine

## 2019-06-21 NOTE — Telephone Encounter (Signed)
Rx sent 

## 2019-06-21 NOTE — Telephone Encounter (Signed)
Refill request for trazodone 100mg - on med list is 50mg . Please advise.

## 2019-06-21 NOTE — Telephone Encounter (Signed)
The right dose is 100 mg qhs, ok to RF 100mg 

## 2019-07-12 ENCOUNTER — Ambulatory Visit (INDEPENDENT_AMBULATORY_CARE_PROVIDER_SITE_OTHER): Payer: 59 | Admitting: Internal Medicine

## 2019-07-12 ENCOUNTER — Other Ambulatory Visit: Payer: Self-pay

## 2019-07-12 ENCOUNTER — Encounter: Payer: Self-pay | Admitting: Internal Medicine

## 2019-07-12 VITALS — BP 104/79 | HR 78 | Temp 99.2°F | Resp 16 | Ht 59.0 in | Wt 170.0 lb

## 2019-07-12 DIAGNOSIS — F322 Major depressive disorder, single episode, severe without psychotic features: Secondary | ICD-10-CM

## 2019-07-12 DIAGNOSIS — Z800 Family history of malignant neoplasm of digestive organs: Secondary | ICD-10-CM

## 2019-07-12 DIAGNOSIS — R635 Abnormal weight gain: Secondary | ICD-10-CM

## 2019-07-12 DIAGNOSIS — Z780 Asymptomatic menopausal state: Secondary | ICD-10-CM | POA: Diagnosis not present

## 2019-07-12 DIAGNOSIS — I10 Essential (primary) hypertension: Secondary | ICD-10-CM | POA: Diagnosis not present

## 2019-07-12 DIAGNOSIS — Z01419 Encounter for gynecological examination (general) (routine) without abnormal findings: Secondary | ICD-10-CM

## 2019-07-12 DIAGNOSIS — R739 Hyperglycemia, unspecified: Secondary | ICD-10-CM | POA: Diagnosis not present

## 2019-07-12 DIAGNOSIS — E785 Hyperlipidemia, unspecified: Secondary | ICD-10-CM

## 2019-07-12 DIAGNOSIS — Z1231 Encounter for screening mammogram for malignant neoplasm of breast: Secondary | ICD-10-CM

## 2019-07-12 LAB — TSH: TSH: 0.6 u[IU]/mL (ref 0.35–4.50)

## 2019-07-12 LAB — FOLLICLE STIMULATING HORMONE: FSH: 6.1 m[IU]/mL

## 2019-07-12 LAB — HEMOGLOBIN A1C: Hgb A1c MFr Bld: 6 % (ref 4.6–6.5)

## 2019-07-12 LAB — LUTEINIZING HORMONE: LH: 2.46 m[IU]/mL

## 2019-07-12 NOTE — Patient Instructions (Addendum)
GO TO THE LAB : Get the blood work     GO TO THE FRONT DESK Schedule your next appointment   for a checkup in 4 months  We are referring you to the gastroenterology group to get a colonoscopy  We are referring you to a new gynecologist  Please continue counseling You also need to see a psychiatrist, please contact the [facility] group at your earliest convenience and see 1 of the doctors there.

## 2019-07-12 NOTE — Assessment & Plan Note (Addendum)
-  Tdap 2015 -prevnar 12/2016 - PNM 23 : 2019 - encouraged a flu shot when available - female care: Needs to see gynecology, referral sent. -CCS: no previous cscope , mother dx w/  Colon ca age 46, will refer to GI

## 2019-07-12 NOTE — Progress Notes (Signed)
Pre visit review using our clinic review tool, if applicable. No additional management support is needed unless otherwise documented below in the visit note. 

## 2019-07-12 NOTE — Progress Notes (Signed)
Subjective:    Patient ID: Abigail Campos, female    DOB: 03-Mar-1971, 48 y.o.   MRN: [id number]  DOS:  07/12/2019 Type of visit - description: Routine visit Concerned about weight gain despite diet change. Menopausal?  Reports night sweats, mood changes, history of hysterectomy. HTN: Ambulatory BPs normal Depression: Ongoing issue, good compliance with medication. Difficulty sleeping: Still an issue.    Review of Systems Denies fever chills No chest pain no difficulty breathing No nausea or vomiting No suicidal ideas  Past Medical History:  Diagnosis Date  . Essential hypertension 10/19/2007   03-2010: metoprolol changed to bystolic (was not feeling well on it, no specific allergy or reaction)    . Hyperlipemia   . Hypertension   . Stroke [facility])     Past Surgical History:  Procedure Laterality Date  . ABDOMINAL HYSTERECTOMY  11-14-07   no oophorectomy per surgical report  . CESAREAN SECTION     [id number]  . INGUINAL HERNIA REPAIR     2004    Social History   Socioeconomic History  . Marital status: Married    Spouse name: Not on file  . Number of children: 2  . Years of education: Not on file  . Highest education level: Not on file  Occupational History  . Occupation: disable d/t Architectural technologist: [employer]  . Financial resource strain: Not on file  . Food insecurity    Worry: Not on file    Inability: Not on file  . Transportation needs    Medical: Not on file    Non-medical: Not on file  Tobacco Use  . Smoking status: Never Smoker  . Smokeless tobacco: Never Used  Substance and Sexual Activity  . Alcohol use: No  . Drug use: No  . Sexual activity: Not on file  Lifestyle  . Physical activity    Days per week: Not on file    Minutes per session: Not on file  . Stress: Not on file  Relationships  . Social Herbalist on phone: Not on file    Gets together: Not on file    Attends religious service: Not on  file    Active member of club or organization: Not on file    Attends meetings of clubs or organizations: Not on file    Relationship status: Not on file  . Intimate partner violence    Fear of current or ex partner: Not on file    Emotionally abused: Not on file    Physically abused: Not on file    Forced sexual activity: Not on file  Other Topics Concern  . Not on file  Social History Narrative   Used to live independently, stroke 03-2016, d/c from rehab to his mother's house 04-2016, then moved back w/ her husband as off 04/2018   Daughter lives in [place],   [place] lives w/ pt      Allergies as of 07/12/2019      Reactions   Bee Venom Anaphylaxis   Peanut-containing Drug Products Anaphylaxis   Hydrocodone Hives   Generalize itching w/o rash   Isovue [iopamidol] Hives, Itching   Pt broke out with one facial hive.  Had itching on her right upper ant and posterior arm w/o evidence of hives.  Pt given water and 25mg  benadryl po.  We observed pt for 30 minutes before d/c.  J. Bohm     Ambien [zolpidem Tartrate] Other (See  Comments)   forgetfulness   Aspirin Swelling   Reports blisters w/ ASA but pt reports is ok w/ Motrin, Advil, naproxen   Latex Hives      Medication List       Accurate as of July 12, 2019 11:59 PM. If you have any questions, ask your nurse or doctor.        atorvastatin 20 MG tablet Commonly known as: LIPITOR Take 1 tablet (20 mg total) by mouth daily at 6 PM.   baclofen 10 MG tablet Commonly known as: LIORESAL Take 1 tablet (10 mg total) by mouth 3 (three) times daily.   buPROPion 100 MG tablet Commonly known as: WELLBUTRIN Take 1 tablet (100 mg total) by mouth 2 (two) times daily.   clopidogrel 75 MG tablet Commonly known as: PLAVIX Take 1 tablet (75 mg total) by mouth daily.   escitalopram 20 MG tablet Commonly known as: LEXAPRO Take 1 tablet (20 mg total) by mouth daily.   furosemide 20 MG tablet Commonly known as: LASIX Take 1 tablet (20  mg total) by mouth daily.   lisinopril 2.5 MG tablet Commonly known as: ZESTRIL Take 1 tablet (2.5 mg total) by mouth daily.   nebivolol 5 MG tablet Commonly known as: Bystolic Take 1 tablet (5 mg total) by mouth every 12 (twelve) hours.   traZODone 100 MG tablet Commonly known as: DESYREL Take 1 tablet (100 mg total) by mouth at bedtime as needed for sleep.           Objective:   Physical Exam BP 104/79 (BP Location: Right Arm, Patient Position: Sitting, Cuff Size: Small)   Pulse 78   Temp 99.2 F (37.3 C) (Oral)   Resp 16   Ht 4\' 11"  (1.499 m)   Wt 170 lb (77.1 kg)   SpO2 97%   BMI 34.34 kg/m  General: Well developed, NAD, BMI noted Neck: No  thyromegaly  HEENT:  Normocephalic . Face symmetric, atraumatic Lungs:  CTA B Normal respiratory effort, no intercostal retractions, no accessory muscle use. Heart: RRR,  no murmur.  No pretibial edema bilaterally    Skin: Exposed areas without rash. Not pale. Not jaundice Neurologic:  alert & oriented X3.  Speech normal, gait at baseline, consistent with previous stroke Strength symmetric and appropriate for age.  Psych: Cognition and judgment appear intact.  Cooperative with normal attention span and concentration.  Behavior appropriate. Seems mild to moderate depressed, no anxious appearing today    Assessment     Assessment   Prediabetes: A1c 6.0 (April 2017)  HTN Hyperlipidemia Depression, insomnia GERD Stroke 03-2016:  ICH, right basilar ganglia due to HTN emergency, + encephalopathy, + dysphagia, aphasia, dysarthria Residual L spasticity.  CTA neck 03-2016 neg CTA head 10-17 (-) aneurysm Headaches , migraines dx after 2nd pregnancy, (-) CT head 2014 and 2015 Insomnia   Cough, persisting, saw Dr. Melvyn Novas 11-2015  PLAN: Preventive care reviewed Prediabetes: Check A1c HTN: Excellent ambulatory BPs.  120 over 70s.  Continue Lasix, lisinopril, beta-blockers.  Last BMP satisfactory Dyslipidemia: Controlled,  continue Lipitor Depression, insomnia: Not well controlled, on Lexapro and Wellbutrin, denies suicidal idea but PHQ 9 #20, severe depression. Apparently she is doing counseling already, recommend to see a psychiatrist, adjust medications? Weight gain: Has tried to change her diet to some extent, encourage to decrease carbohydrate intake, decrease fatty foods, reassess on RTC.  Check a TSH. Menopausal?  Referring to Gyn, check [place] and LH.  History of hysterectomy without oophorectomy in 2008  RTC 4 months

## 2019-07-13 ENCOUNTER — Other Ambulatory Visit: Payer: Self-pay | Admitting: Internal Medicine

## 2019-07-13 DIAGNOSIS — Z1231 Encounter for screening mammogram for malignant neoplasm of breast: Secondary | ICD-10-CM

## 2019-07-13 NOTE — Assessment & Plan Note (Signed)
Preventive care reviewed Prediabetes: Check A1c HTN: Excellent ambulatory BPs.  120 over 70s.  Continue Lasix, lisinopril, beta-blockers.  Last BMP satisfactory Dyslipidemia: Controlled, continue Lipitor Depression, insomnia: Not well controlled, on Lexapro and Wellbutrin, denies suicidal idea but PHQ 9 #20, severe depression. Apparently she is doing counseling already, recommend to see a psychiatrist, adjust medications? Weight gain: Has tried to change her diet to some extent, encourage to decrease carbohydrate intake, decrease fatty foods, reassess on RTC.  Check a TSH. Menopausal?  Referring to Gyn, check [place] and LH.  History of hysterectomy without oophorectomy in 2008 RTC 4 months

## 2019-07-26 ENCOUNTER — Encounter: Payer: Self-pay | Admitting: Internal Medicine

## 2019-07-26 ENCOUNTER — Ambulatory Visit (INDEPENDENT_AMBULATORY_CARE_PROVIDER_SITE_OTHER): Payer: 59 | Admitting: Internal Medicine

## 2019-07-26 ENCOUNTER — Other Ambulatory Visit: Payer: Self-pay

## 2019-07-26 VITALS — BP 124/82 | HR 71 | Temp 97.8°F | Ht 59.0 in | Wt 168.2 lb

## 2019-07-26 DIAGNOSIS — G4733 Obstructive sleep apnea (adult) (pediatric): Secondary | ICD-10-CM | POA: Insufficient documentation

## 2019-07-26 DIAGNOSIS — R0683 Snoring: Secondary | ICD-10-CM | POA: Diagnosis not present

## 2019-07-26 DIAGNOSIS — F5101 Primary insomnia: Secondary | ICD-10-CM | POA: Diagnosis not present

## 2019-07-26 MED ORDER — ZALEPLON 5 MG PO CAPS: ORAL_CAPSULE | ORAL | 2 refills | Status: DC

## 2019-07-26 NOTE — Patient Instructions (Signed)
Order- schedule unattended home sleep test    Dx OSA  Script sent to try adding Sonata (zaleplon) for sleep. You can take this if you wake in the middle of the night, wanting a few more hours of sleep. You an skip it if not needed.  Please call about 2 weeks after your sleep test to see if results and recommendations are ready yet. If appropriate, we might be able to start treatment before we see you next.

## 2019-07-26 NOTE — Progress Notes (Signed)
07/26/2019- 48 yoF never smoker for sleep evaluation. referred by Dr. 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 Pattern persistent x years, before her CVA in April 02, 2016. Ambien 10 mg carried over too long. Clonazepam and Trazodone didn't prevent waking.  Describes comfortable bed and bedroom. Admits buy brain, thinking about things at night. Husband here, states she snores, witnessed apnea.  25 lb weight gain 1-2 years. Denies ENT surgery, heart or lung problems. No seizures.   Prior to Admission medications   Medication Sig Start Date End Date Taking? Authorizing Provider  atorvastatin (LIPITOR) 20 MG tablet Take 1 tablet (20 mg total) by mouth daily at 6 PM. 02/01/19  Yes Paz, Alda Berthold, MD  baclofen (LIORESAL) 10 MG tablet Take 1 tablet (10 mg total) by mouth 3 (three) times daily. 01/17/19  Yes Paz, Alda Berthold, MD  buPROPion (WELLBUTRIN) 100 MG tablet Take 1 tablet (100 mg total) by mouth 2 (two) times daily. 02/08/19  Yes Colon Branch, MD  clopidogrel (PLAVIX) 75 MG tablet Take 1 tablet (75 mg total) by mouth daily. 02/08/19  Yes Paz, Alda Berthold, MD  escitalopram (LEXAPRO) 20 MG tablet Take 1 tablet (20 mg total) by mouth daily. 06/05/19  Yes Paz, Alda Berthold, MD  furosemide (LASIX) 20 MG tablet Take 1 tablet (20 mg total) by mouth daily. 02/01/19  Yes Paz, Alda Berthold, MD  lisinopril (PRINIVIL,ZESTRIL) 2.5 MG tablet Take 1 tablet (2.5 mg total) by mouth daily. 02/08/19  Yes Paz, Alda Berthold, MD  nebivolol (BYSTOLIC) 5 MG tablet Take 1 tablet (5 mg total) by mouth every 12 (twelve) hours. 05/19/19  Yes Paz, Alda Berthold, MD   traZODone (DESYREL) 100 MG tablet Take 1 tablet (100 mg total) by mouth at bedtime as needed for sleep. 06/21/19  Yes Colon Branch, MD  zaleplon (SONATA) 5 MG capsule 1 for sleep if waking during the night 07/26/19   Deneise Lever, MD   Past Medical History:  Diagnosis Date  . Essential hypertension 10/19/2007   03-2010: metoprolol changed to bystolic (was not feeling well on it, no specific allergy or reaction)    . Hyperlipemia   . Hypertension   . Stroke [facility])    Past Surgical History:  Procedure Laterality Date  . ABDOMINAL HYSTERECTOMY  11-14-07   no oophorectomy per surgical report  . CESAREAN SECTION     [id number]  . INGUINAL HERNIA REPAIR     2003-04-03   Family History  Problem Relation Age of Onset  . Diabetes Mother   . Hypertension Mother   . Glaucoma Mother   . Colon cancer Mother        mother age 7  . Stroke Paternal Grandfather   . Heart disease Other        2 uncles died 04/03/11 from CAD-CHF  . Stroke Maternal Grandfather   . Diabetes Other        siblings  . Hypertension Other        siblings  . Breast cancer Neg Hx    Social History   Socioeconomic History  . Marital status:  Married    Spouse name: Not on file  . Number of children: 2  . Years of education: Not on file  . Highest education level: Not on file  Occupational History  . Occupation: disable d/t Architectural technologist: [employer]  . Financial resource strain: Not on file  . Food insecurity    Worry: Not on file    Inability: Not on file  . Transportation needs    Medical: Not on file    Non-medical: Not on file  Tobacco Use  . Smoking status: Never Smoker  . Smokeless tobacco: Never Used  Substance and Sexual Activity  . Alcohol use: No  . Drug use: No  . Sexual activity: Not on file  Lifestyle  . Physical activity    Days per week: Not on file    Minutes per session: Not on file  . Stress: Not on file  Relationships  . Social Herbalist on phone:  Not on file    Gets together: Not on file    Attends religious service: Not on file    Active member of club or organization: Not on file    Attends meetings of clubs or organizations: Not on file    Relationship status: Not on file  . Intimate partner violence    Fear of current or ex partner: Not on file    Emotionally abused: Not on file    Physically abused: Not on file    Forced sexual activity: Not on file  Other Topics Concern  . Not on file  Social History Narrative   Used to live independently, stroke 03-2016, d/c from rehab to his mother's house 04-2016, then moved back w/ her husband as off 04/2018   Daughter lives in [place],   [place] lives w/ pt   ROS-see HPI   + = positive Constitutional:    weight loss, night sweats, fevers, chills, fatigue, lassitude. HEENT:    headaches, difficulty swallowing, tooth/dental problems, sore throat,       sneezing, itching, ear ache, nasal congestion, post nasal drip, snoring CV:    chest pain, orthopnea, PND, swelling in lower extremities, anasarca,                                  dizziness, palpitations Resp:   shortness of breath with exertion or at rest.                productive cough,   non-productive cough, coughing up of blood.              change in color of mucus.  wheezing.   Skin:    rash or lesions. GI:  No-   heartburn, indigestion, abdominal pain, nausea, vomiting, diarrhea,                 change in bowel habits, loss of appetite GU: dysuria, change in color of urine, no urgency or frequency.   flank pain. MS:   joint pain, stiffness, decreased range of motion, back pain. Neuro-     nothing unusual Psych:  change in mood or affect.  depression or anxiety.   memory loss.  OBJ- Physical Exam General- Alert, Oriented, Affect-appropriate, Distress- none acute, +Overweight Skin- rash-none, lesions- none, excoriation- none Lymphadenopathy- none Head- atraumatic            Eyes- Gross vision intact, PERRLA,  conjunctivae and  secretions clear            Ears- Hearing, canals-normal            Nose- Clear, no-Septal dev, mucus, polyps, erosion, perforation             Throat- Mallampati III , mucosa clear , drainage- none, tonsils small, + teeth Neck- flexible , trachea midline, no stridor , thyroid nl, carotid no bruit Chest - symmetrical excursion , unlabored           Heart/CV- RRR , no murmur , no gallop  , no rub, nl s1 s2                           - JVD- none , edema- none, stasis changes- none, varices- none           Lung- clear to P&A, wheeze- none, cough- none , dullness-none, rub- none           Chest wall-  Abd-  Br/ Gen/ Rectal- Not done, not indicated Extrem- cyanosis- none, clubbing, none, atrophy- none,+splint L calf Neuro- doesn't use L hand

## 2019-07-26 NOTE — Assessment & Plan Note (Signed)
Difficulty initiating and maintaining sleep, especially waking 2-4 times during night and unable to return to sleep. Appropriate meds tried, either carry over too long or don't help. We need to make sure sleep apnea isn't part of the problem. Meanwhile we will try a short- half life med to see if she can take it if she wakes around 1:00 or 2:00 AM. Discussed. Plan- [employer]

## 2019-07-26 NOTE — Assessment & Plan Note (Signed)
Possible OSA.  We will want to clarify this before offering too much medication for insomnia.

## 2019-08-11 ENCOUNTER — Ambulatory Visit: Payer: Medicare Other | Admitting: Gastroenterology

## 2019-08-15 ENCOUNTER — Other Ambulatory Visit: Payer: Self-pay | Admitting: Internal Medicine

## 2019-08-15 ENCOUNTER — Encounter: Payer: Self-pay | Admitting: Gastroenterology

## 2019-08-15 DIAGNOSIS — I610 Nontraumatic intracerebral hemorrhage in hemisphere, subcortical: Secondary | ICD-10-CM

## 2019-08-18 ENCOUNTER — Telehealth: Payer: Self-pay | Admitting: Internal Medicine

## 2019-08-18 NOTE — Telephone Encounter (Signed)
This was for HST.  Abigail Campos will call patient back to schedule.

## 2019-08-22 NOTE — Telephone Encounter (Signed)
Pt is scheduled for 08/28/19/cb

## 2019-08-28 ENCOUNTER — Ambulatory Visit: Payer: Medicare Other

## 2019-08-28 ENCOUNTER — Other Ambulatory Visit: Payer: Self-pay

## 2019-08-28 ENCOUNTER — Ambulatory Visit
Admission: RE | Admit: 2019-08-28 | Discharge: 2019-08-28 | Disposition: A | Discharge status: Home / Self Care | Payer: Medicare Other | Source: Ambulatory Visit | Attending: Internal Medicine | Admitting: Internal Medicine

## 2019-08-28 DIAGNOSIS — Z1231 Encounter for screening mammogram for malignant neoplasm of breast: Secondary | ICD-10-CM

## 2019-08-28 DIAGNOSIS — G4733 Obstructive sleep apnea (adult) (pediatric): Secondary | ICD-10-CM | POA: Diagnosis not present

## 2019-08-28 IMAGING — MG MM DIGITAL SCREENING BILAT W/ CAD
4 series · 4 of 4 positions shown · non-contrast
Comparison: Previous exam(s).

CLINICAL DATA: Screening.

EXAM:
DIGITAL SCREENING BILATERAL MAMMOGRAM WITH CAD

[L MLO]
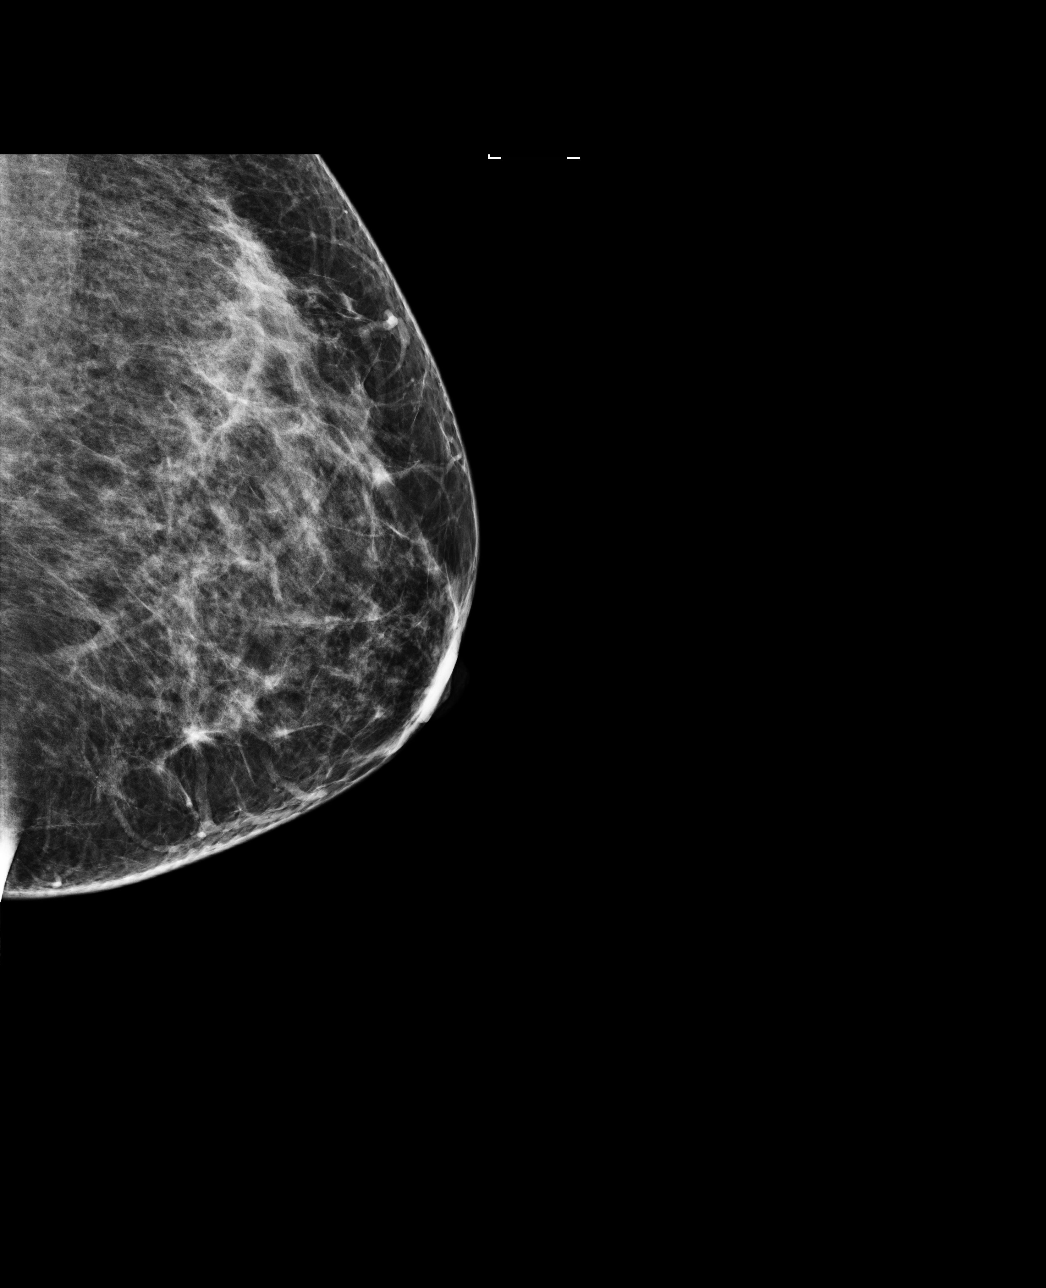

[R CC]
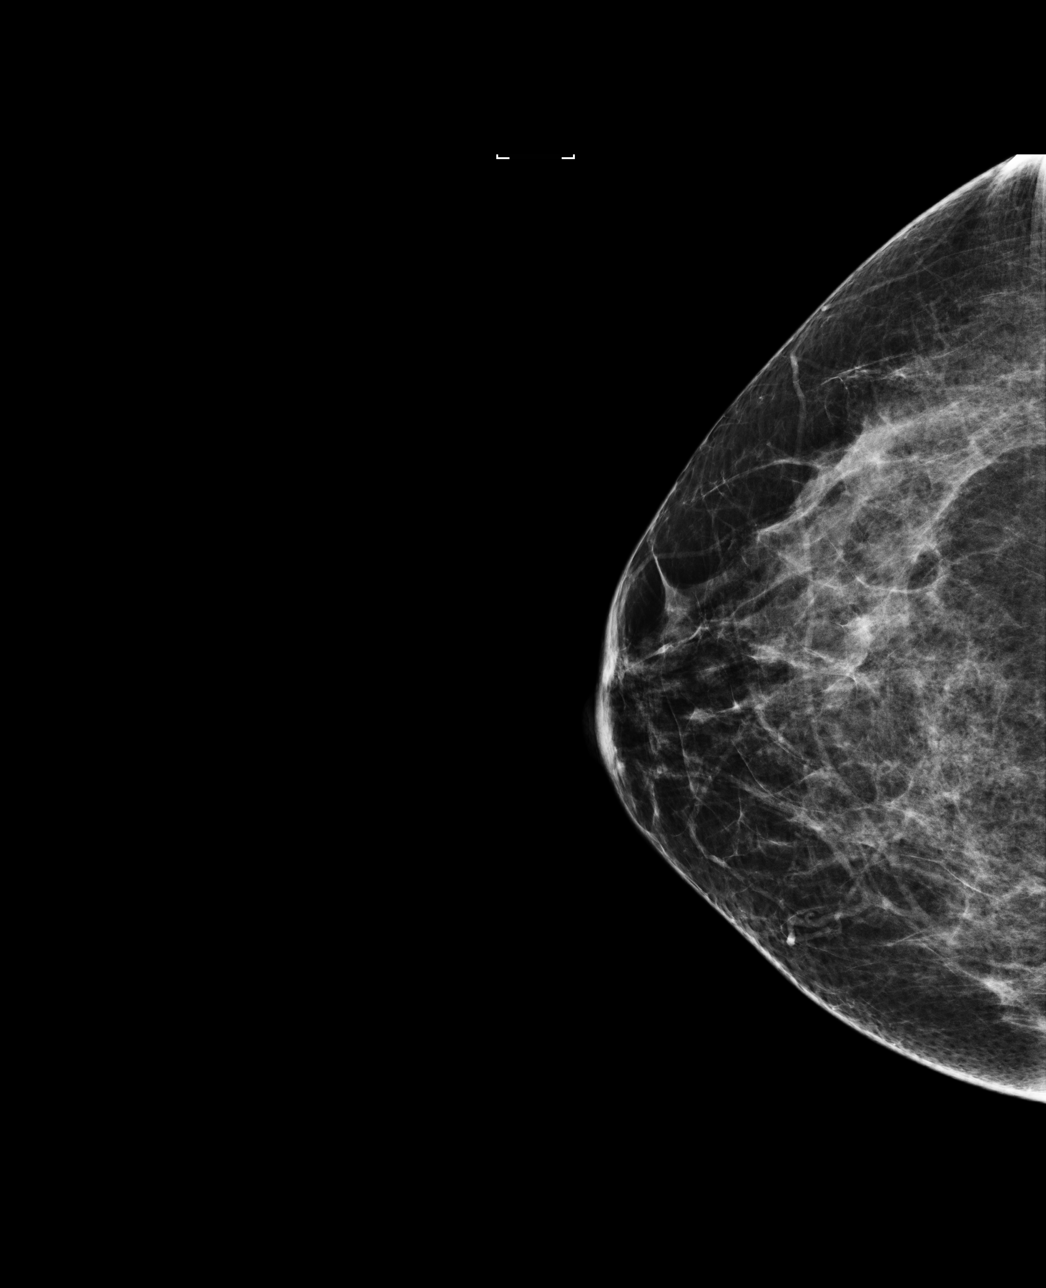

[R MLO]
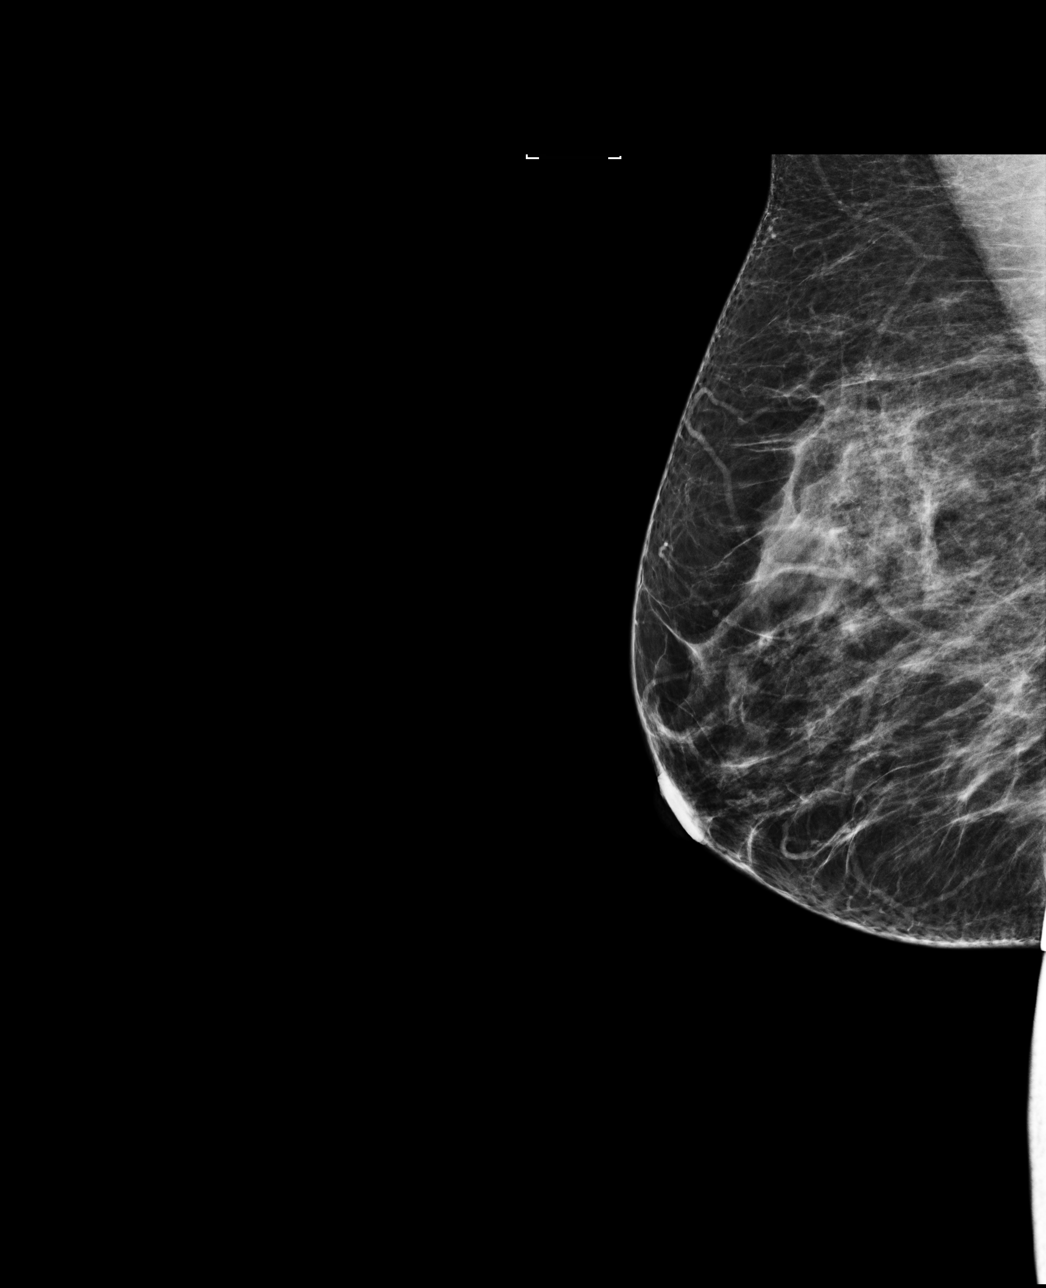

[L CC]
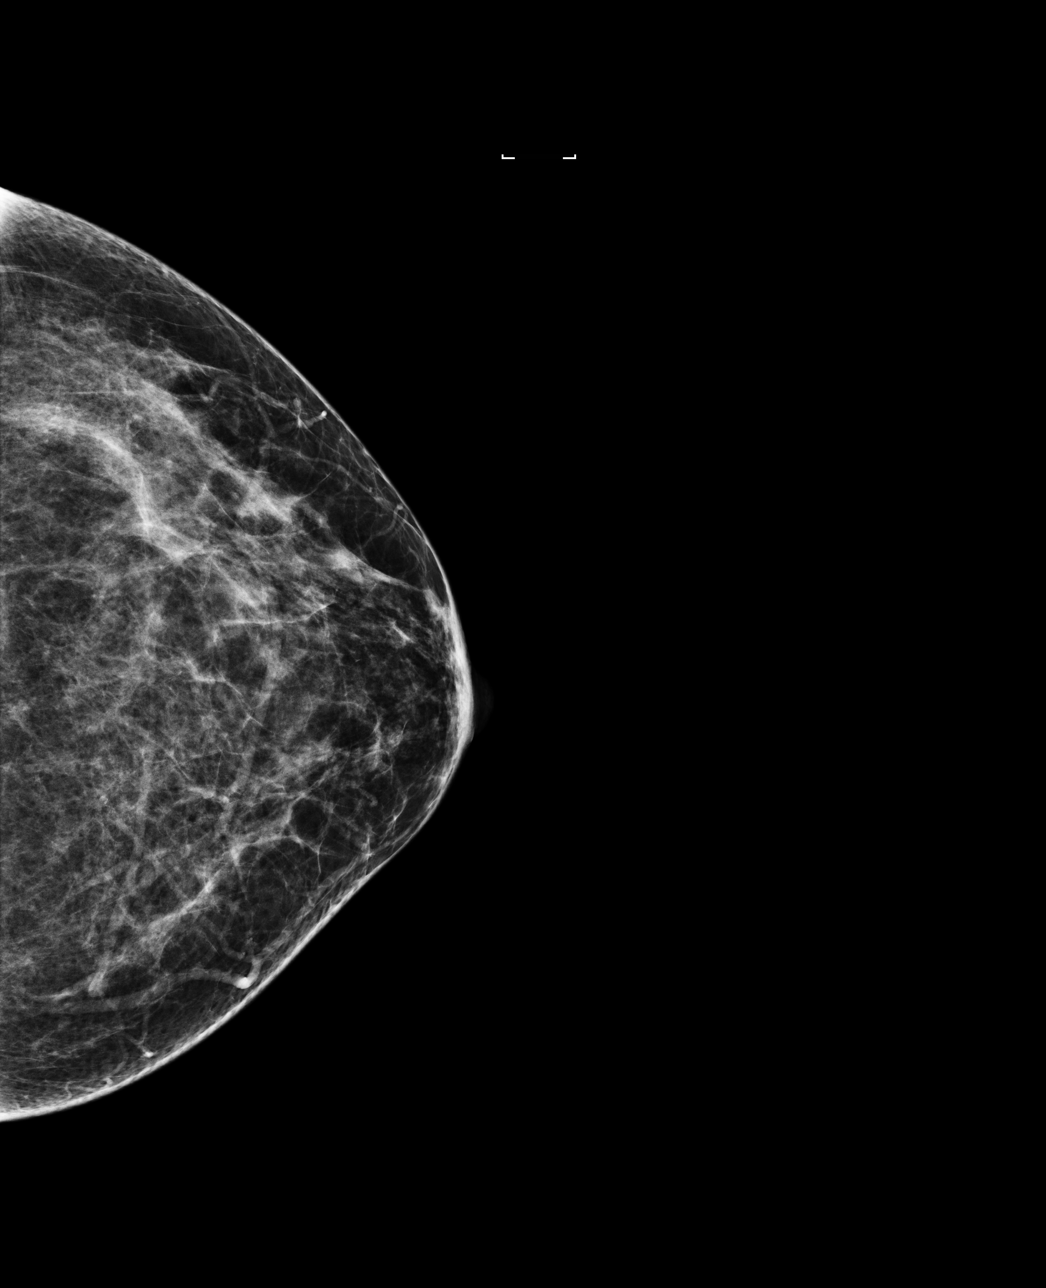

[4 of 4 positions shown; findings below may reference images not displayed]

ACR Breast Density Category c: The breast tissue is heterogeneously
dense, which may obscure small masses.
FINDINGS: There are no findings suspicious for malignancy. Images were
processed with CAD.
IMPRESSION: No mammographic evidence of malignancy. A result letter of this
screening mammogram will be mailed directly to the patient.

RECOMMENDATION:
Screening mammogram in one year. (Code:[0J])

BI-RADS CATEGORY  1: Negative.

## 2019-08-29 DIAGNOSIS — G4733 Obstructive sleep apnea (adult) (pediatric): Secondary | ICD-10-CM | POA: Diagnosis not present

## 2019-09-01 ENCOUNTER — Ambulatory Visit (INDEPENDENT_AMBULATORY_CARE_PROVIDER_SITE_OTHER): Payer: 59 | Admitting: Obstetrics & Gynecology

## 2019-09-01 ENCOUNTER — Other Ambulatory Visit: Payer: Self-pay

## 2019-09-01 ENCOUNTER — Encounter: Payer: Self-pay | Admitting: Obstetrics & Gynecology

## 2019-09-01 VITALS — BP 113/75 | HR 77 | Ht 59.0 in | Wt 162.0 lb

## 2019-09-01 DIAGNOSIS — Z01419 Encounter for gynecological examination (general) (routine) without abnormal findings: Secondary | ICD-10-CM

## 2019-09-01 DIAGNOSIS — N898 Other specified noninflammatory disorders of vagina: Secondary | ICD-10-CM

## 2019-09-01 DIAGNOSIS — N951 Menopausal and female climacteric states: Secondary | ICD-10-CM

## 2019-09-01 MED ORDER — GABAPENTIN 300 MG PO CAPS: 300.0000 mg | ORAL_CAPSULE | Freq: Every day | ORAL | 2 refills | Status: DC

## 2019-09-01 NOTE — Patient Instructions (Signed)
  Name of Product Description Perfume- Free Paraben-Free Glycerin-Free PH-balanced Cost per month  Hyalo-GYN Gel in Tampon applicator containing Hydeal-D, a natural source of moisture. Http://www.hyalogyn.com Yes No Yes No $25  Replens Gel in tampon applicator, Clings to vaginal lining to keep it moist Yes Yes No Yes $15-32  K-Y Liquibeads Suppository that melts in the vagina Yes Yes No No $18-36  Neogyn Cream Cream to soothe vulvar dryness and pain, contains cutaneous lysate, a healing ingredient; not internal moisturizer but may help with irritation on the vulvar  LinkMoves.fr Yes Yes No No $39   Vaginal Moisturizers  Lubricants  - Water or silicone-based  - No perfumes; avoid glycerin or parabens  - If water based look for information on osmolality  - Lubricate all surfaces as a part of foreplay  - Keep lubricant handy in case more is needed  - Sneaking into bathroom before sex is not a good way to use lubricants

## 2019-09-01 NOTE — Progress Notes (Signed)
Subjective:     Abigail Campos is a 48 y.o. female here for a routine exam.  Current complaints: hot flushes and vaginal dryness. Pt is worried about menopause. Pt is not currently sexually active.     Gynecologic History No LMP recorded. Patient has had a hysterectomy. Contraception: status post hysterectomy Last Pap: years prev. S/p hyst in 2007.  Last mammogram: 08/28/2019. Results were: normal  Obstetric History OB History  Gravida Para Term Preterm AB Living  2 2 2     2   SAB TAB Ectopic Multiple Live Births          2    # Outcome Date GA Lbr Len/2nd Weight Sex Delivery Anes PTL Lv  2 Term 2007 [redacted]w[redacted]d   M CS-LTranv Local N LIV  1 Term 1998 [redacted]w[redacted]d   F CS-LTranv Spinal N LIV     The following portions of the patient's history were reviewed and updated as appropriate: allergies, current medications, past family history, past medical history, past social history, past surgical history and problem list.  Review of Systems Pertinent items are noted in HPI.    Objective:  BP 113/75   Pulse 77   Ht 4\' 11"  (1.499 m)   Wt 162 lb 0.6 oz (73.5 kg)   BMI 32.73 kg/m   General Appearance:    Alert, cooperative, no distress, appears stated age  Head:    Normocephalic, without obvious abnormality, atraumatic  Eyes:    conjunctiva/corneas clear, EOM's intact, both eyes  Ears:    Normal external ear canals, both ears  Nose:   Nares normal, septum midline, mucosa normal, no drainage    or sinus tenderness  Throat:   Lips, mucosa, and tongue normal; teeth and gums normal  Neck:   Supple, symmetrical, trachea midline, no adenopathy;    thyroid:  no enlargement/tenderness/nodules  Back:     Symmetric, no curvature, ROM normal, no CVA tenderness  Lungs:     respirations unlabored  Chest Wall:    No tenderness or deformity   Heart:    Regular rate and rhythm  Breast Exam:    No tenderness, masses, or nipple abnormality  Abdomen:     Soft, non-tender, bowel sounds active all four  quadrants,    no masses, no organomegaly  Genitalia:    Normal female without lesion, discharge or tenderness     Extremities:   Extremities normal, atraumatic, no cyanosis or edema' Left sided paralysis of UE and LE.   Pulses:   2+ and symmetric all extremities  Skin:   Skin color, texture, turgor normal, no rashes or lesions   LAB 1 month prev FSH mIU/ML 6.1  6.6    TSH 07/12/2019 WNL Assessment:    Healthy female exam.   Hot flushes and vaginal dryness. Pt with h/o CVA. No lab evidence of menopause. TSH and FSH both WNL. discussed with pt non hormonal management of hot flushes.     Plan:    Follow up in: 1 year.    coconut oil bid Layering for hot flushes Gabapentin 300mg  q HS  F/u 6 weeks or sooner prn  Brittnee Gaetano L. Harraway-Smith, M.D., Cherlynn June

## 2019-09-05 ENCOUNTER — Other Ambulatory Visit: Payer: Self-pay | Admitting: Internal Medicine

## 2019-09-22 ENCOUNTER — Encounter: Payer: Self-pay | Admitting: *Deleted

## 2019-09-25 ENCOUNTER — Ambulatory Visit (INDEPENDENT_AMBULATORY_CARE_PROVIDER_SITE_OTHER): Payer: Medicare Other | Admitting: Gastroenterology

## 2019-09-25 ENCOUNTER — Encounter: Payer: Self-pay | Admitting: Gastroenterology

## 2019-09-25 ENCOUNTER — Encounter: Payer: Medicare Other | Attending: Physical Medicine & Rehabilitation | Admitting: Physical Medicine & Rehabilitation

## 2019-09-25 ENCOUNTER — Telehealth: Payer: Self-pay

## 2019-09-25 ENCOUNTER — Encounter: Payer: Self-pay | Admitting: Physical Medicine & Rehabilitation

## 2019-09-25 ENCOUNTER — Other Ambulatory Visit: Payer: Self-pay

## 2019-09-25 VITALS — BP 114/79 | HR 71 | Temp 97.7°F | Resp 14 | Ht 59.0 in | Wt 162.0 lb

## 2019-09-25 VITALS — BP 90/70 | HR 68 | Temp 97.8°F | Ht 59.0 in | Wt 161.0 lb

## 2019-09-25 DIAGNOSIS — M65311 Trigger thumb, right thumb: Secondary | ICD-10-CM | POA: Diagnosis not present

## 2019-09-25 DIAGNOSIS — Z7902 Long term (current) use of antithrombotics/antiplatelets: Secondary | ICD-10-CM | POA: Diagnosis not present

## 2019-09-25 DIAGNOSIS — K5909 Other constipation: Secondary | ICD-10-CM | POA: Diagnosis not present

## 2019-09-25 DIAGNOSIS — I69154 Hemiplegia and hemiparesis following nontraumatic intracerebral hemorrhage affecting left non-dominant side: Secondary | ICD-10-CM

## 2019-09-25 DIAGNOSIS — Z800 Family history of malignant neoplasm of digestive organs: Secondary | ICD-10-CM | POA: Diagnosis not present

## 2019-09-25 MED ORDER — POLYETHYLENE GLYCOL 3350 17 G PO PACK: 17.0000 g | PACK | Freq: Two times a day (BID) | ORAL | 0 refills | Status: DC

## 2019-09-25 MED ORDER — LINACLOTIDE 145 MCG PO CAPS: 145.0000 ug | ORAL_CAPSULE | Freq: Every day | ORAL | 0 refills | Status: DC

## 2019-09-25 NOTE — Progress Notes (Signed)
HPI :  48 year old female with a history of CVA 2017 on Plavix, left sided weakness, reported history of ovarian cancer, referred by Kathlene November MD for colon cancer screening.   She states she has chronic constipation that bothers her.  She has roughly 2 bowel movements a week which has been her normal baseline for a while.  She feels bloated and has discomfort in her abdomen until she has a bowel movement, after which time she feels much better.  She denies any blood in her stools.  She has used MiraLAX at low-dose in the past and has not thought it helped much.  She has not been taking much for this at all.  Her mother had colon cancer at age 14.  No other family members with known colon cancer, her father had stomach cancer later in life.  She has been on Plavix for the past 3 years.  She states she is otherwise able to ambulate, and walk using a cane.  She is able to bear weight.  She denies any problems with anesthesia in the past.  No weight loss   Past Medical History:  Diagnosis Date  . Anxiety and depression   . Essential hypertension 10/19/2007   03-2010: metoprolol changed to bystolic (was not feeling well on it, no specific allergy or reaction)    . Hemiplegia ([place])   . Hyperlipemia   . Hypertension   . Migraine   . Ovarian cancer ([place])   . Stroke [facility]) 2017     Past Surgical History:  Procedure Laterality Date  . ABDOMINAL HYSTERECTOMY  11-14-07   no oophorectomy per surgical report  . CESAREAN SECTION     [id number]  . INGUINAL HERNIA REPAIR     2004   Family History  Problem Relation Age of Onset  . Diabetes Mother   . Hypertension Mother   . Glaucoma Mother   . Colon cancer Mother        mother age 27  . Stroke Paternal Grandfather   . Stroke Maternal Grandfather   . Diabetes Brother   . Hypertension Brother   . Heart disease Maternal Aunt        x 2 did 2012 CAD-CHF  . Diabetes Brother   . Hypertension Brother   . Lung cancer Maternal Aunt   . Breast  cancer Neg Hx    Social History   Tobacco Use  . Smoking status: Never Smoker  . Smokeless tobacco: Never Used  Substance Use Topics  . Alcohol use: No  . Drug use: No   Current Outpatient Medications  Medication Sig Dispense Refill  . atorvastatin (LIPITOR) 20 MG tablet Take 1 tablet (20 mg total) by mouth daily at 6 PM. 90 tablet 3  . baclofen (LIORESAL) 10 MG tablet Take 1 tablet (10 mg total) by mouth 3 (three) times daily. 270 tablet 3  . buPROPion (WELLBUTRIN) 100 MG tablet Take 1 tablet (100 mg total) by mouth 2 (two) times daily. 180 tablet 3  . BYSTOLIC 5 MG tablet TAKE 1 TABLET BY MOUTH EVERY 12 HOURS 180 tablet 1  . clopidogrel (PLAVIX) 75 MG tablet Take 1 tablet (75 mg total) by mouth daily. 90 tablet 3  . escitalopram (LEXAPRO) 20 MG tablet Take 1 tablet (20 mg total) by mouth daily. 90 tablet 1  . furosemide (LASIX) 20 MG tablet Take 1 tablet (20 mg total) by mouth daily. 90 tablet 3  . gabapentin (NEURONTIN) 300 MG capsule Take 1 capsule (  300 mg total) by mouth at bedtime. 30 capsule 2  . lisinopril (PRINIVIL,ZESTRIL) 2.5 MG tablet Take 1 tablet (2.5 mg total) by mouth daily. 90 tablet 3  . traZODone (DESYREL) 100 MG tablet Take 1 tablet (100 mg total) by mouth at bedtime as needed for sleep. 90 tablet 1  . zaleplon (SONATA) 5 MG capsule 1 for sleep if waking during the night (Patient taking differently: Take 5 mg by mouth every other day. At night) 30 capsule 2   No current facility-administered medications for this visit.    Allergies  Allergen Reactions  . Bee Venom Anaphylaxis  . Peanut-Containing Drug Products Anaphylaxis  . Hydrocodone Hives    Generalize itching w/o rash  . Isovue [Iopamidol] Hives and Itching    Pt broke out with one facial hive.  Had itching on her right upper ant and posterior arm w/o evidence of hives.  Pt given water and 25mg  benadryl po.  We observed pt for 30 minutes before d/c.  J. Bohm    . Ambien [Zolpidem Tartrate] Other (See  Comments)    forgetfulness  . Aspirin Swelling    Reports blisters w/ ASA but pt reports is ok w/ Motrin, Advil, naproxen  . Latex Hives     Review of Systems: All systems reviewed and negative except where noted in HPI.    Mm Digital Screening Bilateral  Result Date: 08/28/2019 CLINICAL DATA:  Screening. EXAM: DIGITAL SCREENING BILATERAL MAMMOGRAM WITH CAD COMPARISON:  Previous exam(s). ACR Breast Density Category c: The breast tissue is heterogeneously dense, which may obscure small masses. FINDINGS: There are no findings suspicious for malignancy. Images were processed with CAD. IMPRESSION: No mammographic evidence of malignancy. A result letter of this screening mammogram will be mailed directly to the patient. RECOMMENDATION: Screening mammogram in one year. (Code:SM-B-01Y) BI-RADS CATEGORY  1: Negative. Electronically Signed   By: Ammie Ferrier M.D.   On: 08/28/2019 16:54   Lab Results  Component Value Date   WBC 5.1 09/20/2018   HGB 12.2 09/20/2018   HCT 37.8 09/20/2018   MCV 93.0 09/20/2018   PLT 185.0 09/20/2018    Lab Results  Component Value Date   CREATININE 0.93 02/27/2019   BUN 10 02/27/2019   NA 136 02/27/2019   K 4.0 02/27/2019   CL 100 02/27/2019   CO2 30 02/27/2019   Lab Results  Component Value Date   ALT 24 02/27/2019   AST 18 02/27/2019   ALKPHOS 87 02/27/2019   BILITOT 0.4 02/27/2019      Physical Exam: BP 90/70 (BP Location: Right Arm, Patient Position: Sitting, Cuff Size: Normal)   Pulse 68   Temp 97.8 F (36.6 C)   Ht 4\' 11"  (1.499 m)   Wt 161 lb (73 kg)   BMI 32.52 kg/m  Constitutional: Pleasant, female in no acute distress. HEENT: Normocephalic and atraumatic. Conjunctivae are normal. No scleral icterus. Neck supple.  Cardiovascular: Normal rate, regular rhythm.  Pulmonary/chest: Effort normal and breath sounds normal. No wheezing, rales or rhonchi. Abdominal: Soft, nondistended, nontender. There are no masses palpable.   Extremities: no edema, brace on left lower leg Lymphadenopathy: No cervical adenopathy noted. Neurological: Alert and oriented to person place and time. Skin: Skin is warm and dry. No rashes noted. Psychiatric: Normal mood and affect. Behavior is normal.   ASSESSMENT AND PLAN: 48 year old female here for new patient assessment of the following:  Chronic constipation - no alarm symptoms, suspect chronic slow transit constipation.  She has not  tried higher dose MiraLAX and we discussed if she wanted to try this a few times a day as a cheaper option.  I otherwise discussed other options gave her a sample of Linzess 145 mcg a day to see if that works better for her.  If she wants a prescription for this she can contact me, otherwise use a trial of higher dose MiraLAX daily which I think will help work for her.  She agreed  Family history of colon cancer /antiplatelet therapy - her mother had colon cancer at a younger age, she is overdue for colon cancer screening at this time.  I discussed colonoscopy with her including risk and benefits of anesthesia and the procedure itself.  She wanted to proceed with optical colonoscopy. She has a history of stroke and on Plavix, she will need to hold the Plavix for 5 days prior to the procedure.  We will reach out to her prescribing provider to see if that is okay.  Further recommendations pending results of the exam.  She agreed  [place] Cellar, MD [facility]  CC: Colon Branch, MD

## 2019-09-25 NOTE — Telephone Encounter (Signed)
History of stroke in 2017, has been stable since. Okay to hold Plavix per GI protocol.

## 2019-09-25 NOTE — Progress Notes (Signed)
Dysport: Procedure Note Patient Name: Abigail Campos DOB: 1970-12-18 MRN: [id number] Date: 09/25/2019  Procedure: Botulinum toxin administration Guidance: EMG Diagnosis: Spastic left nondominant IPH Attending: Delice Lesch, MD   Informed consent: Risks, benefits & options of the procedure are explained to the patient (and/or family). The patient elects to proceed with procedure. Risks include but are not limited to weakness, respiratory distress, dry mouth, ptosis, antibody formation, worsening of some areas of function. Benefits include decreased abnormal muscle tone, improved hygiene and positioning, decreased skin breakdown and, in some cases, decreased pain. Options include conservative management with oral antispasticity agents, phenol chemodenervation of nerve or at motor nerve branches. More invasive options include intrathecal balcofen adminstration for appropriate candidates. Surgical options may include tendon lengthening or transposition or, rarely, dorsal rhizotomy.   History/Physical Examination: 49 y.o.  Year old right-handed female with history of hypertension, migraine headaches presents for follow up after right basal ganglia hemorrhage.   Modified Ashworth: Left 1+/4 elbow flexors, 2/4 wrist flexors, 3/4 left finger flexors, 1+/4 ankle plantar flexors.  Previous Treatments: Therapy/Range of motion Indication for guidance: Target active muscules  Procedure: Botulinum toxin was mixed with preservative free saline with a dilution of 0.5cc to 100 units. Targeted limb and muscles were identified. The skin was prepped with alcohol swabs and placement of needle tip in targeted muscle was confirmed using appropriate guidance. Prior to injection, positioning of needle tip outside of blood vessel was determined by pulling back on syringe plunger.  MUSCLE UNITS  Left Med biceps: 100 units Left FCR: 100 units Left FCU: 100 units Left FDS: 200 units Left FDP: 200 units  Left Med  gastroc: 150 units  Left Lat gastroc: 150 units   Total units: [id number]  Complications: None  Ankit Anil Patel 11:34 AM

## 2019-09-25 NOTE — Telephone Encounter (Signed)
Called and left detailed message for pt to hold plavix starting on October 18 for procedure on 10-23. Asked pt to call back to confirm understanding.

## 2019-09-25 NOTE — Patient Instructions (Addendum)
If you are age 46 or older, your body mass index should be between 23-30. Your Body mass index is 32.52 kg/m. If this is out of the aforementioned range listed, please consider follow up with your Primary Care Provider.  If you are age 56 or younger, your body mass index should be between 19-25. Your Body mass index is 32.52 kg/m. If this is out of the aformentioned range listed, please consider follow up with your Primary Care Provider.   To help prevent the possible spread of infection to our patients, communities, and staff; we will be implementing the following measures:  As of now we are not allowing any visitors/family members to accompany you to any upcoming appointments with [facility]. If you have any concerns about this please contact our office to discuss prior to the appointment.   You have been scheduled for a colonoscopy. Please follow written instructions given to you at your visit today.  Please pick up your prep supplies at the pharmacy within the next 1-3 days. If you use inhalers (even only as needed), please bring them with you on the day of your procedure. Your physician has requested that you go to www.startemmi.com and enter the access code given to you at your visit today. This web site gives a general overview about your procedure. However, you should still follow specific instructions given to you by our office regarding your preparation for the procedure.  We are giving you samples of Linzess 145mcg: Take once a day  OR you can take Miralax, over the counter twice a day.  You will be contacted by our office prior to your procedure for directions on holding your Plavix.  If you do not hear from our office 1 week prior to your scheduled procedure, please call [phone] to discuss.   Thank you for entrusting me with your care and for choosing [facility], Dr. Winthrop Cellar

## 2019-09-25 NOTE — Telephone Encounter (Signed)
   Abigail Campos 22-Sep-1971 [id number]  Dear Dr. Larose Kells:  We have scheduled the above named patient for a(n) Colonoscopy procedure. Our records show that (s)he is on anticoagulation therapy.  Please advise as to whether the patient may come off their therapy of PLAVIX 5 days prior to their procedure which is scheduled for next Friday, 10-06-19.  Please route your response to Lemar Lofty, CMA or fax response to [phone]. Thank you.  Sincerely,    [facility]

## 2019-09-25 NOTE — Telephone Encounter (Signed)
ANTI COAG CLEARANCE LETTER

## 2019-09-26 NOTE — Telephone Encounter (Signed)
Called and spoke to patient. She expressed understanding regarding holding Plavix starting on Oct 18th.

## 2019-09-28 ENCOUNTER — Telehealth: Payer: Self-pay | Admitting: Gastroenterology

## 2019-09-28 DIAGNOSIS — Z800 Family history of malignant neoplasm of digestive organs: Secondary | ICD-10-CM

## 2019-09-28 DIAGNOSIS — Z7902 Long term (current) use of antithrombotics/antiplatelets: Secondary | ICD-10-CM

## 2019-09-28 DIAGNOSIS — K5909 Other constipation: Secondary | ICD-10-CM

## 2019-09-28 MED ORDER — SUPREP BOWEL PREP KIT 17.5-3.13-1.6 GM/177ML PO SOLN: ORAL | 0 refills | Status: DC

## 2019-09-28 NOTE — Telephone Encounter (Signed)
suprep sent to pharmacy

## 2019-09-28 NOTE — Telephone Encounter (Signed)
Pt states that [facility] has not received order for suprep.

## 2019-10-03 ENCOUNTER — Encounter: Payer: Self-pay | Admitting: Gastroenterology

## 2019-10-05 ENCOUNTER — Telehealth: Payer: Self-pay

## 2019-10-05 NOTE — Telephone Encounter (Signed)
Covid-19 screening questions   Do you now or have you had a fever in the last 14 days?  Do you have any respiratory symptoms of shortness of breath or cough now or in the last 14 days?  Do you have any family members or close contacts with diagnosed or suspected Covid-19 in the past 14 days?  Have you been tested for Covid-19 and found to be positive?      Covid-19 screening questions   Do you now or have you had a fever in the last 14 days?  Do you have any respiratory symptoms of shortness of breath or cough now or in the last 14 days?  Do you have any family members or close contacts with diagnosed or suspected Covid-19 in the past 14 days?  Have you been tested for Covid-19 and found to be positive?

## 2019-10-05 NOTE — Telephone Encounter (Signed)
Patient calling back and answered "NO" to all screening questions.

## 2019-10-06 ENCOUNTER — Other Ambulatory Visit: Payer: Self-pay

## 2019-10-06 ENCOUNTER — Other Ambulatory Visit: Payer: Self-pay | Admitting: Gastroenterology

## 2019-10-06 ENCOUNTER — Encounter: Payer: Self-pay | Admitting: Gastroenterology

## 2019-10-06 ENCOUNTER — Ambulatory Visit (AMBULATORY_SURGERY_CENTER): Payer: Medicare Other | Admitting: Gastroenterology

## 2019-10-06 VITALS — BP 141/84 | HR 71 | Temp 98.7°F | Resp 14

## 2019-10-06 DIAGNOSIS — Z800 Family history of malignant neoplasm of digestive organs: Secondary | ICD-10-CM

## 2019-10-06 DIAGNOSIS — Z1211 Encounter for screening for malignant neoplasm of colon: Secondary | ICD-10-CM

## 2019-10-06 DIAGNOSIS — K648 Other hemorrhoids: Secondary | ICD-10-CM

## 2019-10-06 DIAGNOSIS — K6289 Other specified diseases of anus and rectum: Secondary | ICD-10-CM

## 2019-10-06 MED ORDER — SODIUM CHLORIDE 0.9 % IV SOLN: 500.0000 mL | Freq: Once | INTRAVENOUS | Status: DC

## 2019-10-06 NOTE — Op Note (Addendum)
[facility] Patient Name: Abigail Campos Procedure Date: 10/06/2019 11:41 AM MRN: [id number] Endoscopist: Remo Lipps P. Havery Moros , MD Age: 48 Referring MD:  Date of Birth: [birth year] Gender: Female Account #: [id number] Procedure:                Colonoscopy Indications:              Screening in patient at increased risk: Family                            history of 1st-degree relative with colorectal                            cancer (mother diagnosed age 2s) Medicines:                Monitored Anesthesia Care Procedure:                Pre-Anesthesia Assessment:                           - Prior to the procedure, a History and Physical                            was performed, and patient medications and                            allergies were reviewed. The patient's tolerance of                            previous anesthesia was also reviewed. The risks                            and benefits of the procedure and the sedation                            options and risks were discussed with the patient.                            All questions were answered, and informed consent                            was obtained. Prior Anticoagulants: The patient has                            taken Plavix (clopidogrel), last dose was 5 days                            prior to procedure. ASA Grade Assessment: III - A                            patient with severe systemic disease. After                            reviewing the risks and benefits, the patient was  deemed in satisfactory condition to undergo the                            procedure.                           After obtaining informed consent, the colonoscope                            was passed under direct vision. Throughout the                            procedure, the patient's blood pressure, pulse, and                            oxygen saturations were monitored continuously. The                          Colonoscope was introduced through the anus and                            advanced to the the cecum, identified by                            appendiceal orifice and ileocecal valve. The                            colonoscopy was performed without difficulty. The                            patient tolerated the procedure well. The quality                            of the bowel preparation was good. The ileocecal                            valve, appendiceal orifice, and rectum were                            photographed. Scope In: 11:47:07 AM Scope Out: 12:03:46 PM Scope Withdrawal Time: 0 hours 13 minutes 49 seconds  Total Procedure Duration: 0 hours 16 minutes 39 seconds  Findings:                 The perianal and digital rectal examinations were                            normal.                           Internal hemorrhoids were found during                            retroflexion. The hemorrhoids were moderate.                           Anal  papilla(e) were hypertrophied. One was                            somewhat protuberant and biopsies were taken with a                            cold forceps for histology to rule out AIN.                           The exam was otherwise without abnormality. No                            polyps Complications:            No immediate complications. Estimated blood loss:                            Minimal. Estimated Blood Loss:     Estimated blood loss was minimal. Impression:               - Internal hemorrhoids.                           - Anal papilla(e) were hypertrophied. Biopsied to                            rule out AIN.                           - The examination was otherwise normal. Recommendation:           - Patient has a contact number available for                            emergencies. The signs and symptoms of potential                            delayed complications were discussed with the                             patient. Return to normal activities tomorrow.                            Written discharge instructions were provided to the                            patient.                           - Resume previous diet.                           - Continue present medications.                           - Resume Plavix today                           -  Await pathology results.                           - Anticipate repeat colonoscopy in 5 years given                            strong family history of colon cancer Steven P. Armbruster, MD 10/06/2019 12:07:59 PM This report has been signed electronically.

## 2019-10-06 NOTE — Progress Notes (Signed)
Called to room to assist during endoscopic procedure.  Patient ID and intended procedure confirmed with present staff. Received instructions for my participation in the procedure from the performing physician.  

## 2019-10-06 NOTE — Progress Notes (Signed)
[place]

## 2019-10-06 NOTE — Patient Instructions (Signed)
Discharge instructions given. Handout on Hemorrhoids. Resume previous medications. YOU HAD AN ENDOSCOPIC PROCEDURE TODAY AT THE [facility]:   Refer to the procedure report that was given to you for any specific questions about what was found during the examination.  If the procedure report does not answer your questions, please call your gastroenterologist to clarify.  If you requested that your care partner not be given the details of your procedure findings, then the procedure report has been included in a sealed envelope for you to review at your convenience later.  YOU SHOULD EXPECT: Some feelings of bloating in the abdomen. Passage of more gas than usual.  Walking can help get rid of the air that was put into your GI tract during the procedure and reduce the bloating. If you had a lower endoscopy (such as a colonoscopy or flexible sigmoidoscopy) you may notice spotting of blood in your stool or on the toilet paper. If you underwent a bowel prep for your procedure, you may not have a normal bowel movement for a few days.  Please Note:  You might notice some irritation and congestion in your nose or some drainage.  This is from the oxygen used during your procedure.  There is no need for concern and it should clear up in a day or so.  SYMPTOMS TO REPORT IMMEDIATELY:   Following lower endoscopy (colonoscopy or flexible sigmoidoscopy):  Excessive amounts of blood in the stool  Significant tenderness or worsening of abdominal pains  Swelling of the abdomen that is new, acute  Fever of 100F or higher   For urgent or emergent issues, a gastroenterologist can be reached at any hour by calling [phone].   DIET:  We do recommend a small meal at first, but then you may proceed to your regular diet.  Drink plenty of fluids but you should avoid alcoholic beverages for 24 hours.  ACTIVITY:  You should plan to take it easy for the rest of today and you should NOT DRIVE or use heavy  machinery until tomorrow (because of the sedation medicines used during the test).    FOLLOW UP: Our staff will call the number listed on your records 48-72 hours following your procedure to check on you and address any questions or concerns that you may have regarding the information given to you following your procedure. If we do not reach you, we will leave a message.  We will attempt to reach you two times.  During this call, we will ask if you have developed any symptoms of COVID 19. If you develop any symptoms (ie: fever, flu-like symptoms, shortness of breath, cough etc.) before then, please call [phone].  If you test positive for Covid 19 in the 2 weeks post procedure, please call and report this information to us.    If any biopsies were taken you will be contacted by phone or by letter within the next 1-3 weeks.  Please call us at [phone] if you have not heard about the biopsies in 3 weeks.    SIGNATURES/CONFIDENTIALITY: You and/or your care partner have signed paperwork which will be entered into your electronic medical record.  These signatures attest to the fact that that the information above on your After Visit Summary has been reviewed and is understood.  Full responsibility of the confidentiality of this discharge information lies with you and/or your care-partner. 

## 2019-10-06 NOTE — Progress Notes (Signed)
A and O x3. Report to RN. Tolerated MAC anesthesia well.

## 2019-10-10 ENCOUNTER — Telehealth: Payer: Self-pay

## 2019-10-10 NOTE — Telephone Encounter (Signed)
LVM

## 2019-10-10 NOTE — Telephone Encounter (Signed)
Attempted to reach patient for post-procedure f/u call. No answer. Left message for her to please not hesitate to call us if she has any questions/concerns regarding her care. 

## 2019-10-12 ENCOUNTER — Encounter: Payer: Self-pay | Admitting: Physical Medicine & Rehabilitation

## 2019-10-12 ENCOUNTER — Encounter (HOSPITAL_BASED_OUTPATIENT_CLINIC_OR_DEPARTMENT_OTHER): Payer: Medicare Other | Admitting: Physical Medicine & Rehabilitation

## 2019-10-12 ENCOUNTER — Encounter: Payer: Self-pay | Admitting: Gastroenterology

## 2019-10-12 ENCOUNTER — Other Ambulatory Visit: Payer: Self-pay

## 2019-10-12 VITALS — BP 128/88 | HR 74 | Temp 97.7°F | Ht 59.0 in | Wt 162.0 lb

## 2019-10-12 DIAGNOSIS — M65311 Trigger thumb, right thumb: Secondary | ICD-10-CM

## 2019-10-12 DIAGNOSIS — I69154 Hemiplegia and hemiparesis following nontraumatic intracerebral hemorrhage affecting left non-dominant side: Secondary | ICD-10-CM | POA: Diagnosis not present

## 2019-10-12 NOTE — Progress Notes (Signed)
Stenosing tenosynovitis injection  Indication: Trigger thumb pain not relieved by conservative care.  Informed consent was obtained after describing risks and benefits of the procedure with the patient, this includes bleeding, bruising, infection and medication side effects. The patient wishes to proceed and has given written consent. Patient was placed in a seated position with volar hand facing up. The right first digit A1 pulley was marked and prepped with betadine. Vapocoolant spray was applied. A 27-gauge 1-1/2 inch needle was inserted, directed into the tendon and then slightly withdrawn.  After negative draw back for blood, a 0.5 ml solution containing 0.25 mL of 6 mg per ML celestone and 0.25 mL of 2% lidocaine was injected. A band aid was applied. The patient tolerated the procedure well. Post procedure instructions were given. 

## 2019-10-13 ENCOUNTER — Other Ambulatory Visit: Payer: Self-pay

## 2019-10-13 ENCOUNTER — Encounter (HOSPITAL_BASED_OUTPATIENT_CLINIC_OR_DEPARTMENT_OTHER): Payer: Self-pay | Admitting: Emergency Medicine

## 2019-10-13 ENCOUNTER — Emergency Department (HOSPITAL_BASED_OUTPATIENT_CLINIC_OR_DEPARTMENT_OTHER)
Admission: EM | Admit: 2019-10-13 | Discharge: 2019-10-13 | Disposition: A | Discharge status: Home / Self Care | Payer: Medicare Other | Attending: Emergency Medicine | Admitting: Emergency Medicine

## 2019-10-13 DIAGNOSIS — Z91040 Latex allergy status: Secondary | ICD-10-CM | POA: Insufficient documentation

## 2019-10-13 DIAGNOSIS — Z8673 Personal history of transient ischemic attack (TIA), and cerebral infarction without residual deficits: Secondary | ICD-10-CM | POA: Diagnosis not present

## 2019-10-13 DIAGNOSIS — Z79899 Other long term (current) drug therapy: Secondary | ICD-10-CM | POA: Diagnosis not present

## 2019-10-13 DIAGNOSIS — Z7902 Long term (current) use of antithrombotics/antiplatelets: Secondary | ICD-10-CM | POA: Diagnosis not present

## 2019-10-13 DIAGNOSIS — I10 Essential (primary) hypertension: Secondary | ICD-10-CM | POA: Diagnosis not present

## 2019-10-13 DIAGNOSIS — Z91010 Allergy to peanuts: Secondary | ICD-10-CM | POA: Insufficient documentation

## 2019-10-13 DIAGNOSIS — Z8543 Personal history of malignant neoplasm of ovary: Secondary | ICD-10-CM | POA: Insufficient documentation

## 2019-10-13 DIAGNOSIS — G43909 Migraine, unspecified, not intractable, without status migrainosus: Secondary | ICD-10-CM | POA: Diagnosis not present

## 2019-10-13 DIAGNOSIS — R519 Headache, unspecified: Secondary | ICD-10-CM

## 2019-10-13 MED ORDER — METOCLOPRAMIDE HCL 5 MG/ML IJ SOLN
10.0000 mg | Freq: Once | INTRAMUSCULAR | Status: AC
  Administered 2019-10-13: 10 mg via INTRAVENOUS
  Filled 2019-10-13: qty 2

## 2019-10-13 MED ORDER — DIPHENHYDRAMINE HCL 50 MG/ML IJ SOLN
25.0000 mg | Freq: Once | INTRAMUSCULAR | Status: AC
  Administered 2019-10-13: 25 mg via INTRAVENOUS
  Filled 2019-10-13: qty 1

## 2019-10-13 MED ORDER — SODIUM CHLORIDE 0.9 % IV BOLUS
1000.0000 mL | Freq: Once | INTRAVENOUS | Status: AC
  Administered 2019-10-13: 1000 mL via INTRAVENOUS

## 2019-10-13 NOTE — ED Provider Notes (Signed)
[facility] EMERGENCY DEPARTMENT Provider Note   [name]: [id number] Arrival date & time: 10/13/19  1839     History   Chief Complaint Chief Complaint  Patient presents with  . Headache    HPI Abigail Campos is a 48 y.o. female with history of stroke with hemiplegia, hypertension, ICH due to hypertensive emergency, migraine who presents with headache that began gradually after she had a cortisone injection in her thumb today.  She reports this was advised to her that she could develop a headache.  It progressively got worse.  She had some nausea, but no vomiting.  She denies any new vision changes, numbness or tingling or new weakness.     HPI  Past Medical History:  Diagnosis Date  . Anxiety and depression   . Essential hypertension 10/19/2007   03-2010: metoprolol changed to bystolic (was not feeling well on it, no specific allergy or reaction)    . Hemiplegia ([place])   . Hyperlipemia   . Hypertension   . Migraine   . Neuromuscular disorder ([place])    left sided hemiplegia - has brace for l leg and walks with a cane  . Ovarian cancer ([place])   . Sleep apnea    not wearing c-pap yet  . Stroke [facility]) 2017    Patient Active Problem List   Diagnosis Date Noted  . Snoring 07/26/2019  . Spastic hemiplegia of left nondominant side as late effect of nontraumatic intraparenchymal hemorrhage of brain ([place]) 04/06/2019  . Spastic hemiparesis affecting dominant side ([place]) 01/04/2018  . Hyperglycemia 12/29/2016  . Anxiety and depression 12/29/2016  . Muscle spasticity   . Obesity (BMI 30.0-34.9) 04/08/2016  . HLD (hyperlipidemia) 04/08/2016  . Basal ganglia hemorrhage ([place]) 04/08/2016  . Dysphagia as late effect of cerebrovascular disease   . Migraine with aura and without status migrainosus, not intractable   . Gait disturbance, post-stroke   . Hemiplegia, post-stroke ([place])   . Aphasia, post-stroke   . Dysarthria, post-stroke   . ICH (intracerebral hemorrhage) ([facility]) - R  basal ganglia due to hypertensive emergency 04/01/2016  . Upper airway cough syndrome 12/06/2015  . PCP NOTES >>>>>>>>>>>>>>>>>>>>>>>>>>>.. 09/25/2015  . Insomnia 12/28/2012  . Annual physical exam 11/23/2011  . Essential hypertension 10/19/2007    Past Surgical History:  Procedure Laterality Date  . ABDOMINAL HYSTERECTOMY  11-14-07   no oophorectomy per surgical report  . CESAREAN SECTION     [id number]  . INGUINAL HERNIA REPAIR     2004     OB History    Gravida  2   Para  2   Term  2   Preterm      AB      Living  2     SAB      TAB      Ectopic      Multiple      Live Births  2            Home Medications    Prior to Admission medications   Medication Sig Start Date End Date Taking? Authorizing Provider  atorvastatin (LIPITOR) 20 MG tablet Take 1 tablet (20 mg total) by mouth daily at 6 PM. 02/01/19  Yes Paz, Alda Berthold, MD  baclofen (LIORESAL) 10 MG tablet Take 1 tablet (10 mg total) by mouth 3 (three) times daily. 08/16/19  Yes Paz, Alda Berthold, MD  buPROPion (WELLBUTRIN) 100 MG tablet Take 1 tablet (100 mg total) by mouth 2 (two) times daily. 02/08/19  Yes Paz, Alda Berthold, MD  BYSTOLIC 5 MG tablet TAKE 1 TABLET BY MOUTH EVERY 12 HOURS 09/05/19  Yes Colon Branch, MD  clopidogrel (PLAVIX) 75 MG tablet Take 1 tablet (75 mg total) by mouth daily. 02/08/19  Yes Paz, Alda Berthold, MD  escitalopram (LEXAPRO) 20 MG tablet Take 1 tablet (20 mg total) by mouth daily. 06/05/19  Yes Paz, Alda Berthold, MD  furosemide (LASIX) 20 MG tablet Take 1 tablet (20 mg total) by mouth daily. 02/01/19  Yes Paz, Alda Berthold, MD  gabapentin (NEURONTIN) 300 MG capsule Take 1 capsule (300 mg total) by mouth at bedtime. 09/01/19  Yes [name], MD  linaclotide Rolan Lipa) 145 MCG CAPS capsule Take 1 capsule (145 mcg total) by mouth daily before breakfast. Lot [id number], Exp: 01-2020 09/25/19  Yes Armbruster, Carlota Raspberry, MD  lisinopril (PRINIVIL,ZESTRIL) 2.5 MG tablet Take 1 tablet (2.5 mg total) by mouth daily.  02/08/19  Yes Colon Branch, MD  traZODone (DESYREL) 100 MG tablet Take 1 tablet (100 mg total) by mouth at bedtime as needed for sleep. 06/21/19  Yes Paz, Alda Berthold, MD  zaleplon (SONATA) 5 MG capsule 1 for sleep if waking during the night Patient taking differently: Take 5 mg by mouth every other day. At night 07/26/19  Yes Young, Kasandra Knudsen, MD    Family History Family History  Problem Relation Age of Onset  . Diabetes Mother   . Hypertension Mother   . Glaucoma Mother   . Colon cancer Mother        mother age 66  . Rectal cancer Father        dx 60  . Stroke Paternal Grandfather   . Stroke Maternal Grandfather   . Diabetes Brother   . Hypertension Brother   . Heart disease Maternal Aunt        x 2 did 2012 CAD-CHF  . Diabetes Brother   . Hypertension Brother   . Lung cancer Maternal Aunt   . Breast cancer Neg Hx   . Esophageal cancer Neg Hx   . Stomach cancer Neg Hx     Social History Social History   Tobacco Use  . Smoking status: Never Smoker  . Smokeless tobacco: Never Used  Substance Use Topics  . Alcohol use: No  . Drug use: No     Allergies   Bee venom, Peanut-containing drug products, Hydrocodone, Isovue [iopamidol], Ambien [zolpidem tartrate], Aspirin, and Latex   Review of Systems Review of Systems  Constitutional: Negative for chills and fever.  HENT: Negative for facial swelling and sore throat.   Respiratory: Negative for shortness of breath.   Cardiovascular: Negative for chest pain.  Gastrointestinal: Negative for abdominal pain, nausea and vomiting.  Genitourinary: Negative for dysuria.  Musculoskeletal: Negative for back pain.  Skin: Negative for rash and wound.  Neurological: Positive for headaches.  Psychiatric/Behavioral: The patient is not nervous/anxious.      Physical Exam Updated Vital Signs BP (!) 142/85 (BP Location: Right Arm)   Pulse 63   Temp 98.3 F (36.8 C) (Oral)   Resp 20   Ht 4\' 11"  (1.499 m)   Wt 73.5 kg   SpO2 96%    BMI 32.72 kg/m   Physical Exam Vitals signs and nursing note reviewed.  Constitutional:      General: She is not in acute distress.    Appearance: She is well-developed. She is not diaphoretic.  HENT:     Head: Normocephalic and atraumatic.  Mouth/Throat:     Pharynx: No oropharyngeal exudate.  Eyes:     General: No scleral icterus.       Right eye: No discharge.        Left eye: No discharge.     Conjunctiva/sclera: Conjunctivae normal.     Pupils: Pupils are equal, round, and reactive to light.  Neck:     Musculoskeletal: Normal range of motion and neck supple. Muscular tenderness present. No neck rigidity or spinous process tenderness.     Thyroid: No thyromegaly.   Cardiovascular:     Rate and Rhythm: Normal rate and regular rhythm.     Heart sounds: Normal heart sounds. No murmur. No friction rub. No gallop.   Pulmonary:     Effort: Pulmonary effort is normal. No respiratory distress.     Breath sounds: Normal breath sounds. No stridor. No wheezing or rales.  Abdominal:     General: Bowel sounds are normal. There is no distension.     Palpations: Abdomen is soft.     Tenderness: There is no abdominal tenderness. There is no guarding or rebound.  Lymphadenopathy:     Cervical: No cervical adenopathy.  Skin:    General: Skin is warm and dry.     Coloration: Skin is not pale.     Findings: No rash.  Neurological:     Mental Status: She is alert.     Coordination: Coordination normal.     Comments: CN 3-12 intact; normal sensation throughout; 5/5 strength in all 4 extremities; equal bilateral grip strength      ED Treatments / Results  Labs (all labs ordered are listed, but only abnormal results are displayed) Labs Reviewed - No data to display  EKG None  Radiology No results found.  Procedures Procedures (including critical care time)  Medications Ordered in ED Medications  sodium chloride 0.9 % bolus 1,000 mL (0 mLs Intravenous Stopped 10/13/19  2226)  metoCLOPramide (REGLAN) injection 10 mg (10 mg Intravenous Given 10/13/19 2041)  diphenhydrAMINE (BENADRYL) injection 25 mg (25 mg Intravenous Given 10/13/19 2041)     Initial Impression / Assessment and Plan / ED Course  I have reviewed the triage vital signs and the nursing notes.  Pertinent labs & imaging results that were available during my care of the patient were reviewed by me and considered in my medical decision making (see chart for details).        Pt HA treated and improved while in ED.  Presentation is like pts typical HA and non concerning for [facility], ICH, Meningitis, or temporal arteritis. Pt is afebrile with no focal neuro deficits, nuchal rigidity, or change in vision.  Patient was advised that headache could be a side effect of cortisone injection today. Patient with history of migraines.  Patient to follow-up with PCP.  Strict return precautions given including worsening headache.  Patient understands and agrees with plan.  Patient vitals stable throughout ED course and discharged in satisfactory condition.   Final Clinical Impressions(s) / ED Diagnoses   Final diagnoses:  Bad headache    ED Discharge Orders    None       Frederica Kuster, PA-C 10/13/19 2312    Quintella Reichert, MD 10/14/19 [phone]

## 2019-10-13 NOTE — Discharge Instructions (Addendum)
Please return the emergency department if you develop any new or worsening symptoms including severe, return of headache, or any other concerning symptoms.

## 2019-10-13 NOTE — ED Triage Notes (Signed)
Pt reports getting Cortisone injection yesterday and was told by the doctor that she "might get a slight HA".  She says she had a slight HA yesterday but this morning she woke up with a severe HA and it has not gone away.  OTC meds didn't help.

## 2019-10-13 NOTE — ED Notes (Signed)
Patient verbalizes understanding of discharge instructions. Opportunity for questioning and answers were provided. Armband removed by staff, pt discharged from ED.  

## 2019-10-16 ENCOUNTER — Other Ambulatory Visit: Payer: Self-pay

## 2019-10-16 ENCOUNTER — Encounter: Payer: Self-pay | Admitting: Obstetrics & Gynecology

## 2019-10-16 ENCOUNTER — Ambulatory Visit (INDEPENDENT_AMBULATORY_CARE_PROVIDER_SITE_OTHER): Payer: Medicare Other | Admitting: Obstetrics & Gynecology

## 2019-10-16 DIAGNOSIS — N951 Menopausal and female climacteric states: Secondary | ICD-10-CM

## 2019-10-16 DIAGNOSIS — N898 Other specified noninflammatory disorders of vagina: Secondary | ICD-10-CM

## 2019-10-16 MED ORDER — GABAPENTIN 300 MG PO CAPS: 300.0000 mg | ORAL_CAPSULE | Freq: Two times a day (BID) | ORAL | 2 refills | Status: DC

## 2019-10-16 NOTE — Progress Notes (Signed)
Patient agrees to webex visit. Patient states that hot flashes are about the same. Patient states she is out of the gabapentin. Kathrene Alu RN

## 2019-10-16 NOTE — Progress Notes (Signed)
    TELEHEALTH GYNECOLOGY VIRTUAL VIDEO VISIT ENCOUNTER NOTE  Provider location: [facility] [employer] at [facility]   I connected with Abigail Campos on 10/16/19 at 10:30 AM EST by WebEx Video Encounter at home and verified that I am speaking with the correct person using two identifiers.   I discussed the limitations, risks, security and privacy concerns of performing an evaluation and management service virtually and the availability of in person appointments. I also discussed with the patient that there may be a patient responsible charge related to this service. The patient expressed understanding and agreed to proceed.   History:  Abigail Campos is a 48 y.o. G10P2002 female being evaluated today for f/u of vaginal dryness and hot flushes. Pt reports that the dryness has completely improved with the coconut oil. She reports that the Gabapentin has helped but, she still has hot flushes. She denies feeling sleepy with the meds.      Past Medical History:  Diagnosis Date  . Anxiety and depression   . Essential hypertension 10/19/2007   03-2010: metoprolol changed to bystolic (was not feeling well on it, no specific allergy or reaction)    . Hemiplegia ([place])   . Hyperlipemia   . Hypertension   . Migraine   . Neuromuscular disorder ([place])    left sided hemiplegia - has brace for l leg and walks with a cane  . Ovarian cancer ([place])   . Sleep apnea    not wearing c-pap yet  . Stroke [facility]) 2017   Past Surgical History:  Procedure Laterality Date  . ABDOMINAL HYSTERECTOMY  11-14-07   no oophorectomy per surgical report  . CESAREAN SECTION     [id number]  . INGUINAL HERNIA REPAIR     2004   The following portions of the patient's history were reviewed and updated as appropriate: allergies, current medications, past family history, past medical history, past social history, past surgical history and problem list.     Review of Systems:  Pertinent items noted in  HPI and remainder of comprehensive ROS otherwise negative.  Physical Exam:   General:  Alert, oriented and cooperative. Patient appears to be in no acute distress.  Mental Status: Normal mood and affect. Normal behavior. Normal judgment and thought content.   Respiratory: Normal respiratory effort, no problems with respiration noted  Rest of physical exam deferred due to type of encounter  Assessment and Plan:     Vaginal dryness- sx improved on coconut oil  Keep coconut oil bid   Perimenopausal state- hot flushes  Gabapentin increase from 300mg  q day to 300mg  bid        I discussed the assessment and treatment plan with the patient. The patient was provided an opportunity to ask questions and all were answered. The patient agreed with the plan and demonstrated an understanding of the instructions.   The patient was advised to call back or seek an in-person evaluation/go to the ED if the symptoms worsen or if the condition fails to improve as anticipated.  I provided 12 minutes of face-to-face time during this encounter.   Abigail Drafts, MD [facility], [place]

## 2019-10-31 ENCOUNTER — Encounter: Payer: 59 | Admitting: Physical Medicine & Rehabilitation

## 2019-11-02 ENCOUNTER — Other Ambulatory Visit: Payer: Self-pay

## 2019-11-02 ENCOUNTER — Encounter: Payer: Self-pay | Admitting: Physical Medicine & Rehabilitation

## 2019-11-02 ENCOUNTER — Ambulatory Visit (INDEPENDENT_AMBULATORY_CARE_PROVIDER_SITE_OTHER): Payer: Medicare Other | Admitting: Internal Medicine

## 2019-11-02 ENCOUNTER — Encounter: Payer: 59 | Attending: Physical Medicine & Rehabilitation | Admitting: Physical Medicine & Rehabilitation

## 2019-11-02 ENCOUNTER — Encounter: Payer: Self-pay | Admitting: Internal Medicine

## 2019-11-02 VITALS — BP 118/79 | HR 87 | Temp 97.9°F | Ht 59.0 in | Wt 166.0 lb

## 2019-11-02 VITALS — BP 110/76 | HR 76 | Temp 97.8°F | Ht 59.0 in | Wt 164.4 lb

## 2019-11-02 DIAGNOSIS — M65311 Trigger thumb, right thumb: Secondary | ICD-10-CM | POA: Insufficient documentation

## 2019-11-02 DIAGNOSIS — G4733 Obstructive sleep apnea (adult) (pediatric): Secondary | ICD-10-CM | POA: Diagnosis not present

## 2019-11-02 DIAGNOSIS — I619 Nontraumatic intracerebral hemorrhage, unspecified: Secondary | ICD-10-CM | POA: Diagnosis not present

## 2019-11-02 DIAGNOSIS — I69398 Other sequelae of cerebral infarction: Secondary | ICD-10-CM | POA: Diagnosis not present

## 2019-11-02 DIAGNOSIS — R269 Unspecified abnormalities of gait and mobility: Secondary | ICD-10-CM

## 2019-11-02 DIAGNOSIS — G8114 Spastic hemiplegia affecting left nondominant side: Secondary | ICD-10-CM

## 2019-11-02 DIAGNOSIS — I69154 Hemiplegia and hemiparesis following nontraumatic intracerebral hemorrhage affecting left non-dominant side: Secondary | ICD-10-CM | POA: Diagnosis not present

## 2019-11-02 NOTE — Assessment & Plan Note (Signed)
She continues to work with rehabilitation team.

## 2019-11-02 NOTE — Progress Notes (Signed)
HPI Abigail Campos  never smoker followed for OSA, Insomnia, complicated by Migraine, HBP, CVA 2017 (Basal Ganglia ICH)/ aphasia/ dysphagia/ spastic L hemiparesis, Upper Airway Cough, Depression, GERD HST 08/28/2019- AHI 12.3/ hr, desaturation to 75%, body weight 168 lbs  --------------------------------------------------------------------------------------------------  07/26/2019- 42 yoF never smoker for sleep evaluation. referred by Dr. Larose Kells (PCP) for insomina, pt states she has trouble sleeping, has tried Ambien and trazadone 100 Medical problem list includes Migraine, HBP, CVA 2017 (Basal Ganglia ICH)/ aphasia/ dysphagia/ spastic L hemiparesis, Upper Airway Cough, Depression, GERD Current med list includes Trazodone 100 mg, Wellbutrin 100 mg, Lexapro 20 mg,  Body weight today 160 lbs Epworth score 5   Patient complains mainly of waking during night, as for bathroom, then unable to regain sleep. Pattern persistent x years, before her CVA in 2017. Ambien 10 mg carried over too long. Clonazepam and Trazodone didn't prevent waking.  Describes comfortable bed and bedroom. Admits busy brain, thinking about things at night. Husband here, states she snores, witnessed apnea.  25 lb weight gain 1-2 years. Denies ENT surgery, heart or lung problems. No seizures.   11/02/2019- 62 yoF never smoker followed for OSA, Insomnia, complicated by Migraine, HBP, CVA 2017 (Basal Ganglia ICH)/ aphasia/ dysphagia/ spastic L hemiplegia, Upper Airway Cough, Depression, GERD HST 08/28/2019- AHI 12.3/ hr, desaturation to 75%, body weight 168 lbs Meds include neurontin, trazodone 100, sonta 5 mg,  Body weight today 164 lbs Reviewed her sleep study and treatment options. She finds the oral appliance more attractive and she has mild OSA. She has family with CPAP and understands that can be a back up option. With non-functional L hand, manipulating CPAP mask may be difficult.  Had flu vax.   ROS-see HPI   + = positive Constitutional:     weight loss, night sweats, fevers, chills, fatigue, lassitude. HEENT:    +headaches, difficulty swallowing, tooth/dental problems, sore throat,       sneezing, itching, ear ache, nasal congestion, post nasal drip, snoring CV:    chest pain, orthopnea, PND, swelling in lower extremities, anasarca,                                  dizziness, palpitations Resp:   shortness of breath with exertion or at rest.                productive cough,   non-productive cough, coughing up of blood.              change in color of mucus.  wheezing.   Skin:    rash or lesions. GI:  No-   heartburn, indigestion, abdominal pain, nausea, vomiting, diarrhea,                 change in bowel habits, loss of appetite GU: dysuria, change in color of urine, no urgency or frequency.   flank pain. MS:   joint pain, stiffness, decreased range of motion, back pain. Neuro-   +L spastic hemiparesis Psych:  change in mood or affect.  depression or anxiety.   memory loss.  OBJ- Physical Exam General- Alert, Oriented, Affect-appropriate, Distress- none acute, +Overweight Skin- rash-none, lesions- none, excoriation- none Lymphadenopathy- none Head- atraumatic            Eyes- Gross vision intact, PERRLA, conjunctivae and secretions clear            Ears- Hearing, canals-normal  Nose- Clear, no-Septal dev, mucus, polyps, erosion, perforation             Throat- Mallampati II-III , mucosa clear , drainage- none, tonsils small, + teeth Neck- flexible , trachea midline, no stridor , thyroid nl, carotid no bruit Chest - symmetrical excursion , unlabored           Heart/CV- RRR , no murmur , no gallop  , no rub, nl s1 s2                           - JVD- none , edema- none, stasis changes- none, varices- none           Lung- clear to P&A, wheeze- none, cough- none , dullness-none, rub- none           Chest wall-  Abd-  Br/ Gen/ Rectal- Not done, not indicated Extrem- cyanosis- none, clubbing, none, atrophy-  none,+splint L calf, + cane Neuro- L spastic hemiplegic.

## 2019-11-02 NOTE — Assessment & Plan Note (Signed)
Mild OSA. Medical concerns, especially with her BP and Neurologic history, impact of OSA and treatment options discussed in detail. Plan- Refer for consideration of oral appliance

## 2019-11-02 NOTE — Patient Instructions (Signed)
Order- referral to Orthodontist Dr Oneal Grout     Consider oral appliance for OSA  Please call if we can help. If you decide not to try the oral appliance now, then keep the return appointment here and we can talk again about ways to help look into CPAP.

## 2019-11-02 NOTE — Progress Notes (Signed)
Subjective:    Patient ID: Abigail Campos, female    DOB: 1971/06/07, 48 y.o.   MRN: [id number]  HPI:  Right-handed female with history of hypertension, migraine headaches presents for follow up after right basal ganglia hemorrhage.   Last clinic visit 10/12/2019.  Since that time, patient went to the ED for headache, after steroid injection, which helped, possible, but unlikely related to the injection.  She had good benefit with her trigger finger. She had goo benefit with dysport as well. Denies falls. She is wearing her splint for her trigger thumb. She did not try Melatonin, but is following up with sleep specialist. She notes edema in her left knee with pain occasionally.   Pain Inventory Average Pain 6 Pain Right Now 0 My pain is aching  In the last 24 hours, has pain interfered with the following? General activity 5 Relation with others 5 Enjoyment of life 5 What TIME of day is your pain at its worst? evening Sleep (in general) Fair  Pain is worse with: walking and standing Pain improves with: rest and heat/ice Relief from Meds: 4  Mobility walk with assistance use a cane ability to climb steps?  yes do you drive?  no Do you have any goals in this area?  yes  Function disabled: date disabled . I need assistance with the following:  meal prep, household duties and shopping  Neuro/Psych bladder control problems weakness numbness tremor trouble walking spasms dizziness confusion depression anxiety  Prior Studies Any changes since last visit?  no  Physicians involved in your care Any changes since last visit?  no   Family History  Problem Relation Age of Onset  . Diabetes Mother   . Hypertension Mother   . Glaucoma Mother   . Colon cancer Mother        mother age 33  . Rectal cancer Father        dx 101  . Stroke Paternal Grandfather   . Stroke Maternal Grandfather   . Diabetes Brother   . Hypertension Brother   . Heart disease Maternal  Aunt        x 2 did 2012 CAD-CHF  . Diabetes Brother   . Hypertension Brother   . Lung cancer Maternal Aunt   . Breast cancer Neg Hx   . Esophageal cancer Neg Hx   . Stomach cancer Neg Hx    Social History   Socioeconomic History  . Marital status: Married    Spouse name: Not on file  . Number of children: 2  . Years of education: Not on file  . Highest education level: Not on file  Occupational History  . Occupation: disable d/t Architectural technologist: [employer]  . Financial resource strain: Not on file  . Food insecurity    Worry: Not on file    Inability: Not on file  . Transportation needs    Medical: Not on file    Non-medical: Not on file  Tobacco Use  . Smoking status: Never Smoker  . Smokeless tobacco: Never Used  Substance and Sexual Activity  . Alcohol use: No  . Drug use: No  . Sexual activity: Not Currently    Birth control/protection: Surgical  Lifestyle  . Physical activity    Days per week: Not on file    Minutes per session: Not on file  . Stress: Not on file  Relationships  . Social Herbalist on phone:  Not on file    Gets together: Not on file    Attends religious service: Not on file    Active member of club or organization: Not on file    Attends meetings of clubs or organizations: Not on file    Relationship status: Not on file  Other Topics Concern  . Not on file  Social History Narrative   Used to live independently, stroke 03-2016, d/c from rehab to his mother's house 04-2016, then moved back w/ her husband as off 04/2018   Daughter lives in [place],   [place] lives w/ pt   Past Surgical History:  Procedure Laterality Date  . ABDOMINAL HYSTERECTOMY  11-14-07   no oophorectomy per surgical report  . CESAREAN SECTION     [id number]  . INGUINAL HERNIA REPAIR     2004   Past Medical History:  Diagnosis Date  . Anxiety and depression   . Essential hypertension 10/19/2007   03-2010: metoprolol changed to  bystolic (was not feeling well on it, no specific allergy or reaction)    . Hemiplegia ([place])   . Hyperlipemia   . Hypertension   . Migraine   . Neuromuscular disorder ([place])    left sided hemiplegia - has brace for l leg and walks with a cane  . Ovarian cancer ([place])   . Sleep apnea    not wearing c-pap yet  . Stroke ([place]) 2017   BP 118/79   Pulse 87   Temp 97.9 F (36.6 C)   Ht 4\' 11"  (1.499 m)   Wt 166 lb (75.3 kg)   SpO2 94%   BMI 33.53 kg/m   Opioid Risk Score:   Fall Risk Score:  `1  Depression screen PHQ 2/9  Depression screen [facility] 2/9 07/12/2019 05/04/2019 04/06/2019 08/05/2018 01/28/2018 08/13/2016 06/10/2016  Decreased Interest 2 1 1  0 0 0 0  Down, Depressed, Hopeless 2 1 1  0 0 0 0  PHQ - 2 Score 4 2 2  0 0 0 0  Altered sleeping 3 - - - - - -  Tired, decreased energy 3 - - - - - -  Change in appetite 3 - - - - - -  Feeling bad or failure about yourself  3 - - - - - -  Trouble concentrating 2 - - - - - -  Moving slowly or fidgety/restless 2 - - - - - -  Suicidal thoughts 0 - - - - - -  PHQ-9 Score 20 - - - - - -  Some recent data might be hidden   Review of Systems  Musk: Gait abnormality Neurological: Positive for  Weakness, arthralgia.  All other systems reviewed and are negative.     Objective:     Physical Exam Constitutional: No distress . Vital signs reviewed. HENT: Normocephalic.  Atraumatic. Eyes: EOMI. No discharge. Cardiovascular: No JVD. Respiratory: Normal effort.  No stridor. GI: Non-distended. Skin: Warm and dry.  Intact. Psych: Normal mood.  Normal behavior. Musc: No edema in extremities.  No tenderness in extremities. Gait: Hemiplegic Neurological: She is Alert and oriented  Mild dysarthria Left facial weakness Motor: RUE/RLE: 5/5 proximal to distal.   LLE: Hip flexion 4/5, knee extension 4-4+/5, ADF/PF 1/5 LUE: Shoulder abduction 2-/5, elbow flex 0/5, elbow ext 0/5, hand grip 0/5.  Modified Ashworth: Left 1/4 elbow flexors, wrist flexors  1+/5, 2/4 left finger flexors, 2/4 ankle plantar flexors.      Assessment & Plan:  Right-handed female with history of hypertension,  migraine headaches presents for follow up after right basal ganglia hemorrhage.    1. 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 affecting left nondominant side  Cont meds  Cont Follow up Neurology  Spastic hemiplegia affecting left non-dominant side  Have had lengthy discussion with patient regarding treatment options for spasticity  Cont baclofen 10mg  TID (pt does not wish to increase due to side effects)  Will change Dysport injection, pt would like to continue at decreased dose:    Left Med biceps: Cont 100 units   Left FCR: Cont 100 units   Left FCU: Cont 100 units   Left FDS: Increase 300 units   Left FDP: Increase to 300 units    Left Med gastroc: Cont 150    Left Lat gastroc: Cont 150   Cont HEP  Cont WHO   2. Gait abnormality  Cont quad cane  Cont Therapies   3. Right trigger thumb  Improved with no locking  Overuse injury as well with positioning  Educated again, figure 8 splint   Pt to consider surgery referral in future if necessary  Good benefit with injection on 10/29  4. Sleep disturbance  Following up with sleep specialist

## 2019-11-13 ENCOUNTER — Other Ambulatory Visit: Payer: Self-pay

## 2019-11-13 ENCOUNTER — Ambulatory Visit: Payer: Medicare Other | Admitting: Internal Medicine

## 2019-11-13 ENCOUNTER — Ambulatory Visit (INDEPENDENT_AMBULATORY_CARE_PROVIDER_SITE_OTHER): Payer: Medicare Other | Admitting: Internal Medicine

## 2019-11-13 VITALS — BP 119/80

## 2019-11-13 DIAGNOSIS — E785 Hyperlipidemia, unspecified: Secondary | ICD-10-CM | POA: Diagnosis not present

## 2019-11-13 DIAGNOSIS — F322 Major depressive disorder, single episode, severe without psychotic features: Secondary | ICD-10-CM | POA: Diagnosis not present

## 2019-11-13 DIAGNOSIS — I10 Essential (primary) hypertension: Secondary | ICD-10-CM

## 2019-11-13 DIAGNOSIS — F329 Major depressive disorder, single episode, unspecified: Secondary | ICD-10-CM | POA: Diagnosis not present

## 2019-11-13 DIAGNOSIS — F419 Anxiety disorder, unspecified: Secondary | ICD-10-CM

## 2019-11-13 DIAGNOSIS — F32A Depression, unspecified: Secondary | ICD-10-CM

## 2019-11-13 MED ORDER — ZALEPLON 10 MG PO CAPS: 10.0000 mg | ORAL_CAPSULE | Freq: Every evening | ORAL | 0 refills | Status: DC | PRN

## 2019-11-13 NOTE — Progress Notes (Signed)
   Subjective:    Patient ID: Abigail Campos, female    DOB: 1970-12-28, 48 y.o.   MRN: [id number]  DOS:  11/13/2019 Type of visit - description: cancelled

## 2019-11-13 NOTE — Progress Notes (Signed)
Subjective:    Patient ID: Abigail Campos, female    DOB: Jun 30, 1971, 48 y.o.   MRN: [id number]  DOS:  11/13/2019 Type of visit - description: Attempted  to make this a video visit, due to technical difficulties from the patient side it was not possible  thus we proceeded with a Virtual Visit via Telephone    I connected with@   by telephone and verified that I am speaking with the correct person using two identifiers.  THIS ENCOUNTER IS A VIRTUAL VISIT DUE TO COVID-19 - PATIENT WAS NOT SEEN IN THE OFFICE. PATIENT HAS CONSENTED TO VIRTUAL VISIT / TELEMEDICINE VISIT   Location of patient: home  Location of provider: office  I discussed the limitations, risks, security and privacy concerns of performing an evaluation and management service by telephone and the availability of in person appointments. I also discussed with the patient that there may be a patient responsible charge related to this service. The patient expressed understanding and agreed to proceed.   History of Present Illness: Follow-up In general doing well, eating healthier, trying to be more active, would like to stop some of her medications. Ambulatory BPs are great, today 118/80. Still has stress but depression is under better control right now. Insomnia: Still an issue, having a hard time falling asleep. Saw pulmonary, note reviewed.  Review of Systems Denies fever chills No chest pain no difficulty breathing No cough  Past Medical History:  Diagnosis Date  . Anxiety and depression   . Essential hypertension 10/19/2007   03-2010: metoprolol changed to bystolic (was not feeling well on it, no specific allergy or reaction)    . Hemiplegia ([place])   . Hyperlipemia   . Hypertension   . Migraine   . Neuromuscular disorder ([place])    left sided hemiplegia - has brace for l leg and walks with a cane  . Ovarian cancer ([place])   . Sleep apnea    not wearing c-pap yet  . Stroke [facility]) 2017    Past Surgical  History:  Procedure Laterality Date  . ABDOMINAL HYSTERECTOMY  11-14-07   no oophorectomy per surgical report  . CESAREAN SECTION     [id number]  . INGUINAL HERNIA REPAIR     2004    Social History   Socioeconomic History  . Marital status: Married    Spouse name: Not on file  . Number of children: 2  . Years of education: Not on file  . Highest education level: Not on file  Occupational History  . Occupation: disable d/t Architectural technologist: [employer]  . Financial resource strain: Not on file  . Food insecurity    Worry: Not on file    Inability: Not on file  . Transportation needs    Medical: Not on file    Non-medical: Not on file  Tobacco Use  . Smoking status: Never Smoker  . Smokeless tobacco: Never Used  Substance and Sexual Activity  . Alcohol use: No  . Drug use: No  . Sexual activity: Not Currently    Birth control/protection: Surgical  Lifestyle  . Physical activity    Days per week: Not on file    Minutes per session: Not on file  . Stress: Not on file  Relationships  . Social Herbalist on phone: Not on file    Gets together: Not on file    Attends religious service: Not on file  Active member of club or organization: Not on file    Attends meetings of clubs or organizations: Not on file    Relationship status: Not on file  . Intimate partner violence    Fear of current or ex partner: Not on file    Emotionally abused: Not on file    Physically abused: Not on file    Forced sexual activity: Not on file  Other Topics Concern  . Not on file  Social History Narrative   Used to live independently, stroke 03-2016, d/c from rehab to his mother's house 04-2016, then moved back w/ her husband as off 04/2018   Daughter lives in [place],   [place] lives w/ pt      Allergies as of 11/13/2019      Reactions   Bee Venom Anaphylaxis   Peanut-containing Drug Products Anaphylaxis   Hydrocodone Hives   Generalize itching  w/o rash   Isovue [iopamidol] Hives, Itching   Pt broke out with one facial hive.  Had itching on her right upper ant and posterior arm w/o evidence of hives.  Pt given water and 25mg  benadryl po.  We observed pt for 30 minutes before d/c.  J. Bohm     Ambien [zolpidem Tartrate] Other (See Comments)   forgetfulness   Aspirin Swelling   Reports blisters w/ ASA but pt reports is ok w/ Motrin, Advil, naproxen   Latex Hives      Medication List       Accurate as of November 13, 2019 11:59 PM. If you have any questions, ask your nurse or doctor.        STOP taking these medications   furosemide 20 MG tablet Commonly known as: LASIX Stopped by: Kathlene November, MD   traZODone 100 MG tablet Commonly known as: [place] by: Kathlene November, MD     TAKE these medications   atorvastatin 20 MG tablet Commonly known as: LIPITOR Take 1 tablet (20 mg total) by mouth daily at 6 PM.   baclofen 10 MG tablet Commonly known as: LIORESAL Take 1 tablet (10 mg total) by mouth 3 (three) times daily.   buPROPion 100 MG tablet Commonly known as: WELLBUTRIN Take 1 tablet (100 mg total) by mouth 2 (two) times daily.   Bystolic 5 MG tablet Generic drug: nebivolol TAKE 1 TABLET BY MOUTH EVERY 12 HOURS   clopidogrel 75 MG tablet Commonly known as: PLAVIX Take 1 tablet (75 mg total) by mouth daily.   escitalopram 20 MG tablet Commonly known as: LEXAPRO Take 1 tablet (20 mg total) by mouth daily.   gabapentin 300 MG capsule Commonly known as: NEURONTIN Take 1 capsule (300 mg total) by mouth 2 (two) times daily.   linaclotide 145 MCG Caps capsule Commonly known as: Linzess Take 1 capsule (145 mcg total) by mouth daily before breakfast. Lot [id number], Exp: 01-2020   lisinopril 2.5 MG tablet Commonly known as: ZESTRIL Take 1 tablet (2.5 mg total) by mouth daily.   zaleplon 10 MG capsule Commonly known as: SONATA Take 1 capsule (10 mg total) by mouth at bedtime as needed for sleep. What changed:    medication strength  how much to take  how to take this  when to take this  reasons to take this  additional instructions Changed by: Kathlene November, MD           Objective:   Physical Exam BP 119/80  This is a virtual  visit, she sounded well, alert oriented x3,  no distress.   l   Assessment    Assessment   Prediabetes: A1c 6.0 (April 2017)  HTN Hyperlipidemia Depression, insomnia GERD Stroke 03-2016:  ICH, right basilar ganglia due to HTN emergency, + encephalopathy, + dysphagia, aphasia, dysarthria Residual L spasticity.  CTA neck 03-2016 neg CTA head 10-17 (-) aneurysm Headaches , migraines dx after 2nd pregnancy, (-) CT head 2014 and 2015 Insomnia   Cough, persisting, saw Dr. Melvyn Novas 11-2015  PLAN: HTN: Currently on lisinopril, beta-blockers and Lasix, doing great with lifestyle, would like to stop some of her medication.  We agreed to hold Lasix, monitor BPs. Hyperlipidemia: Continue Lipitor, controlled. Depression, insomnia: Emotionally doing better, continue Lexapro and Wellbutrin.  Trazodone not helping as well as zaleplon 5mg . Change zaleplon to 10 mg and stop trazodone. OSA: Saw Dr. Annamaria Boots, referred to orthodontics RTC 3 months     I discussed the assessment and treatment plan with the patient. The patient was provided an opportunity to ask questions and all were answered. The patient agreed with the plan and demonstrated an understanding of the instructions.   The patient was advised to call back or seek an in-person evaluation if the symptoms worsen or if the condition fails to improve as anticipated.  I provided 15 minutes of non-face-to-face time during this encounter.  Kathlene November, MD   This visit occurred during the SARS-CoV-2 public health emergency.  Safety protocols were in place, including screening questions prior to the visit, additional usage of staff PPE, and extensive cleaning of exam room while observing appropriate contact time as indicated  for disinfecting solutions.

## 2019-11-14 NOTE — Assessment & Plan Note (Signed)
HTN: Currently on lisinopril, beta-blockers and Lasix, doing great with lifestyle, would like to stop some of her medication.  We agreed to hold Lasix, monitor BPs. Hyperlipidemia: Continue Lipitor, controlled. Depression, insomnia: Emotionally doing better, continue Lexapro and Wellbutrin.  Trazodone not helping as well as zaleplon 5mg . Change zaleplon to 10 mg and stop trazodone. OSA: Saw Dr. Annamaria Boots, referred to orthodontics RTC 3 months

## 2019-11-19 ENCOUNTER — Other Ambulatory Visit: Payer: Self-pay | Admitting: Internal Medicine

## 2019-12-18 ENCOUNTER — Encounter: Payer: Self-pay | Admitting: Medical

## 2019-12-18 ENCOUNTER — Other Ambulatory Visit: Payer: Self-pay

## 2019-12-18 ENCOUNTER — Ambulatory Visit (INDEPENDENT_AMBULATORY_CARE_PROVIDER_SITE_OTHER): Payer: No Typology Code available for payment source | Admitting: Medical

## 2019-12-18 VITALS — BP 117/73 | HR 66 | Temp 96.9°F | Resp 18 | Wt 162.9 lb

## 2019-12-18 DIAGNOSIS — R1031 Right lower quadrant pain: Secondary | ICD-10-CM | POA: Diagnosis not present

## 2019-12-18 DIAGNOSIS — R8290 Unspecified abnormal findings in urine: Secondary | ICD-10-CM | POA: Diagnosis not present

## 2019-12-18 LAB — COMPREHENSIVE METABOLIC PANEL
AG Ratio: 1.3 (calc) (ref 1.0–2.5)
ALT: 29 U/L (ref 6–29)
AST: 19 U/L (ref 10–35)
Albumin: 4.1 g/dL (ref 3.6–5.1)
Alkaline phosphatase (APISO): 75 U/L (ref 31–125)
BUN: 9 mg/dL (ref 7–25)
CO2: 31 mmol/L (ref 20–32)
Calcium: 9.2 mg/dL (ref 8.6–10.2)
Chloride: 104 mmol/L (ref 98–110)
Creat: 0.99 mg/dL (ref 0.50–1.10)
Globulin: 3.1 g/dL (calc) (ref 1.9–3.7)
Glucose, Bld: 74 mg/dL (ref 65–99)
Potassium: 4.2 mmol/L (ref 3.5–5.3)
Sodium: 140 mmol/L (ref 135–146)
Total Bilirubin: 0.4 mg/dL (ref 0.2–1.2)
Total Protein: 7.2 g/dL (ref 6.1–8.1)

## 2019-12-18 LAB — CBC WITH DIFFERENTIAL/PLATELET
Absolute Monocytes: 455 cells/uL (ref 200–950)
Basophils Absolute: 10 cells/uL (ref 0–200)
Basophils Relative: 0.2 %
Eosinophils Absolute: 30 cells/uL (ref 15–500)
Eosinophils Relative: 0.6 %
HCT: 37.1 % (ref 35.0–45.0)
Hemoglobin: 11.9 g/dL (ref 11.7–15.5)
Lymphs Abs: 1765 cells/uL (ref 850–3900)
MCH: 30.9 pg (ref 27.0–33.0)
MCHC: 32.1 g/dL (ref 32.0–36.0)
MCV: 96.4 fL (ref 80.0–100.0)
MPV: 10.9 fL (ref 7.5–12.5)
Monocytes Relative: 9.1 %
Neutro Abs: 2740 cells/uL (ref 1500–7800)
Neutrophils Relative %: 54.8 %
Platelets: 197 10*3/uL (ref 140–400)
RBC: 3.85 10*6/uL (ref 3.80–5.10)
RDW: 12.4 % (ref 11.0–15.0)
Total Lymphocyte: 35.3 %
WBC: 5 10*3/uL (ref 3.8–10.8)

## 2019-12-18 MED ORDER — PREDNISONE 50 MG PO TABS: ORAL_TABLET | ORAL | 0 refills | Status: DC

## 2019-12-18 MED ORDER — DIPHENHYDRAMINE HCL 25 MG PO TABS: ORAL_TABLET | ORAL | 0 refills | Status: DC

## 2019-12-18 NOTE — Patient Instructions (Addendum)
For our rt lower quadrant pain will get cbc, cmp and ct abdomen pelvis stat.   Prednsone 50 mg one pill 13 hours before ct. Then another 7 hours before ct and then 1 hour before ct(due to allergy contrast media).  Then one 50 mg benadryl 1 hour prior to test.  Ibuprofen for pain. Advise bland diet.  Urine culture sent out due to odor to urine.  Decided on ct since total hysterectomy hx given.  If pain worsens or changes pending results of test/severe  Follow up 7 days or as needed

## 2019-12-18 NOTE — Progress Notes (Signed)
Subjective:    Patient ID: Abigail Campos, female    DOB: October 07, 1971, 49 y.o.   MRN: [id number]  HPI  Pt in with some pain for 2 days. She points to rt side. Started on Saturday. Pain started to become sharp. Some pain when she walks.  Pain is about 7/10.   No vaginal dc. No fever, no chills or sweats. No frequent urination. Urinated 3 times today.  Appetite is normal.   LMP- 13 years ago had hysterectomy. Pt thinks both ovaries removed. Pt had hernia repair in past. Also pt had 2 c-sections.   Pt last bm was Friday. Pt states does not feel constipated. Her normal bowel pattern about every 2-3 days.  Some urine odor.   Pt can use ibuprofen for pain. Prior use no side effects per pt.   Review of Systems  Constitutional: Negative for chills, fatigue and fever.  Respiratory: Negative for cough, chest tightness, shortness of breath and wheezing.   Cardiovascular: Negative for chest pain and palpitations.  Gastrointestinal: Positive for abdominal pain. Negative for abdominal distention, blood in stool, constipation, diarrhea, nausea, rectal pain and vomiting.  Genitourinary: Negative for difficulty urinating, dysuria, frequency, hematuria, urgency and vaginal discharge.  Musculoskeletal: Negative for back pain.  Neurological: Negative for dizziness and headaches.  Hematological: Negative for adenopathy. Does not bruise/bleed easily.  Psychiatric/Behavioral: Negative for behavioral problems and confusion.    Past Medical History:  Diagnosis Date  . Anxiety and depression   . Essential hypertension 10/19/2007   03-2010: metoprolol changed to bystolic (was not feeling well on it, no specific allergy or reaction)    . Hemiplegia ([place])   . Hyperlipemia   . Hypertension   . Migraine   . Neuromuscular disorder ([place])    left sided hemiplegia - has brace for l leg and walks with a cane  . Ovarian cancer ([place])   . Sleep apnea    not wearing c-pap yet  . Stroke [facility]) 2017       Social History   Socioeconomic History  . Marital status: Married    Spouse name: Not on file  . Number of children: 2  . Years of education: Not on file  . Highest education level: Not on file  Occupational History  . Occupation: disable d/t strokeTEACHER    Employer: [employer]  Tobacco Use  . Smoking status: Never Smoker  . Smokeless tobacco: Never Used  Substance and Sexual Activity  . Alcohol use: No  . Drug use: No  . Sexual activity: Not Currently    Birth control/protection: Surgical  Other Topics Concern  . Not on file  Social History Narrative   Used to live independently, stroke 03-2016, d/c from rehab to his mother's house 04-2016, then moved back w/ her husband as off 04/2018   Daughter lives in [place],   [place] lives w/ pt   Social Determinants of Health   Financial Resource Strain:   . Difficulty of Paying Living Expenses: Not on file  Food Insecurity:   . Worried About Charity fundraiser in the Last Year: Not on file  . Ran Out of Food in the Last Year: Not on file  Transportation Needs:   . Lack of Transportation (Medical): Not on file  . Lack of Transportation (Non-Medical): Not on file  Physical Activity:   . Days of Exercise per Week: Not on file  . Minutes of Exercise per Session: Not on file  Stress:   . Feeling  of Stress : Not on file  Social Connections:   . Frequency of Communication with Friends and Family: Not on file  . Frequency of Social Gatherings with Friends and Family: Not on file  . Attends Religious Services: Not on file  . Active Member of Clubs or Organizations: Not on file  . Attends Archivist Meetings: Not on file  . Marital Status: Not on file  Intimate Partner Violence:   . Fear of Current or Ex-Partner: Not on file  . Emotionally Abused: Not on file  . Physically Abused: Not on file  . Sexually Abused: Not on file    Past Surgical History:  Procedure Laterality Date  . ABDOMINAL HYSTERECTOMY  11-14-07    no oophorectomy per surgical report  . CESAREAN SECTION     [id number]  . INGUINAL HERNIA REPAIR     2004    Family History  Problem Relation Age of Onset  . Diabetes Mother   . Hypertension Mother   . Glaucoma Mother   . Colon cancer Mother        mother age 30  . Rectal cancer Father        dx 48  . Stroke Paternal Grandfather   . Stroke Maternal Grandfather   . Diabetes Brother   . Hypertension Brother   . Heart disease Maternal Aunt        x 2 did 2012 CAD-CHF  . Diabetes Brother   . Hypertension Brother   . Lung cancer Maternal Aunt   . Breast cancer Neg Hx   . Esophageal cancer Neg Hx   . Stomach cancer Neg Hx     Allergies  Allergen Reactions  . Bee Venom Anaphylaxis  . Peanut-Containing Drug Products Anaphylaxis  . Hydrocodone Hives    Generalize itching w/o rash  . Isovue [Iopamidol] Hives and Itching    Pt broke out with one facial hive.  Had itching on her right upper ant and posterior arm w/o evidence of hives.  Pt given water and 25mg  benadryl po.  We observed pt for 30 minutes before d/c.  J. Bohm    . Ambien [Zolpidem Tartrate] Other (See Comments)    forgetfulness  . Aspirin Swelling    Reports blisters w/ ASA but pt reports is ok w/ Motrin, Advil, naproxen  . Latex Hives    Current Outpatient Medications on File Prior to Visit  Medication Sig Dispense Refill  . atorvastatin (LIPITOR) 20 MG tablet Take 1 tablet (20 mg total) by mouth daily at 6 PM. 90 tablet 3  . baclofen (LIORESAL) 10 MG tablet Take 1 tablet (10 mg total) by mouth 3 (three) times daily. 270 tablet 3  . buPROPion (WELLBUTRIN) 100 MG tablet Take 1 tablet (100 mg total) by mouth 2 (two) times daily. 180 tablet 3  . BYSTOLIC 5 MG tablet TAKE 1 TABLET BY MOUTH EVERY 12 HOURS 180 tablet 1  . clopidogrel (PLAVIX) 75 MG tablet Take 1 tablet (75 mg total) by mouth daily. 90 tablet 3  . escitalopram (LEXAPRO) 20 MG tablet Take 1 tablet (20 mg total) by mouth daily. 90 tablet 1  . gabapentin  (NEURONTIN) 300 MG capsule Take 1 capsule (300 mg total) by mouth 2 (two) times daily. 60 capsule 2  . linaclotide (LINZESS) 145 MCG CAPS capsule Take 1 capsule (145 mcg total) by mouth daily before breakfast. Lot [id number], Exp: 01-2020 8 capsule 0  . lisinopril (PRINIVIL,ZESTRIL) 2.5 MG tablet Take 1 tablet (2.5 mg  total) by mouth daily. 90 tablet 3  . zaleplon (SONATA) 10 MG capsule Take 1 capsule (10 mg total) by mouth at bedtime as needed for sleep. 30 capsule 0   No current facility-administered medications on file prior to visit.    BP 117/73 (BP Location: Right Arm, Patient Position: Sitting, Cuff Size: Large)   Pulse 66   Temp (!) 96.9 F (36.1 C) (Temporal)   Resp 18   Wt 162 lb 14.4 oz (73.9 kg)   SpO2 97%   BMI 32.90 kg/m       Objective:   Physical Exam  General Appearance- Not in acute distress.  HEENT Eyes- Scleraeral/Conjuntiva-bilat- Not Yellow. Mouth & Throat- Normal.  Chest and Lung Exam Auscultation: Breath sounds:-Normal. Adventitious sounds:- No Adventitious sounds.  Cardiovascular Auscultation:Rythm - Regular. Heart Sounds -Normal heart sounds.  Abdomen Inspection:-Inspection Normal.  Palpation/Perucssion: Palpation and Percussion of the abdomen reveal-  moderate Tender rt lower quadrant(heel jar pain), No Rebound tenderness, No rigidity(Guarding) and No Palpable abdominal masses.  Liver:-Normal.  Spleen:- Normal.   Back- no cva tenderness.     Assessment & Plan:  For our rt lower quadrant pain will get cbc, cmp and ct abdomen pelvis stat.   Prednsone 50 mg one pill 13 hours before ct. Then another 7 hours before ct and then 1 hour before ct(due to allergy contrast media).  Then one 50 mg benadryl 1 hour prior to test.  Ibuprofen for pain. Advise bland diet.  Urine culture sent out due to odor to urine.  Decided on ct since total hysterectomy hx given.  If pain worsens or changes pending results of test/severe  Follow up 7 days or as  needed  30+ minutes spent with pt. 50% of time spent counseling pt on plan going forward  [employer], [employer]

## 2019-12-19 ENCOUNTER — Telehealth: Payer: Self-pay

## 2019-12-19 ENCOUNTER — Telehealth: Payer: Self-pay | Admitting: Medical

## 2019-12-19 ENCOUNTER — Ambulatory Visit (HOSPITAL_BASED_OUTPATIENT_CLINIC_OR_DEPARTMENT_OTHER)
Admission: RE | Admit: 2019-12-19 | Discharge: 2019-12-19 | Disposition: A | Discharge status: Home / Self Care | Payer: No Typology Code available for payment source | Source: Ambulatory Visit | Attending: Medical | Admitting: Medical

## 2019-12-19 DIAGNOSIS — K8689 Other specified diseases of pancreas: Secondary | ICD-10-CM

## 2019-12-19 DIAGNOSIS — R1031 Right lower quadrant pain: Secondary | ICD-10-CM | POA: Diagnosis not present

## 2019-12-19 DIAGNOSIS — K862 Cyst of pancreas: Secondary | ICD-10-CM

## 2019-12-19 DIAGNOSIS — R102 Pelvic and perineal pain: Secondary | ICD-10-CM

## 2019-12-19 IMAGING — CT CT ABD-PELV W/ CM
2 of 5 series · 15 of 46 positions shown, 17 images · IV contrast (Omnipaque)
Comparison: [DATE].

CLINICAL DATA: Right lower quadrant pain

EXAM:
CT ABDOMEN AND PELVIS WITH CONTRAST
TECHNIQUE: Multidetector CT imaging of the abdomen and pelvis was performed
using the standard protocol following bolus administration of
intravenous contrast. Oral contrast also administered.
CONTRAST:  100mL OMNIPAQUE IOHEXOL 300 MG/ML  SOLN

[Series 2: axial (person_name) (person_name) · axial · 0.90mm/px · z∈[-474,-84]mm · 12 of 88 slices shown, 14 images]
[im 5/88  soft-tissue]
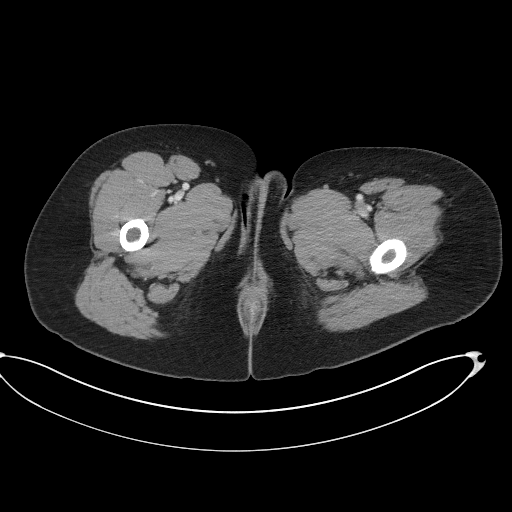
[im 5/88  bone]
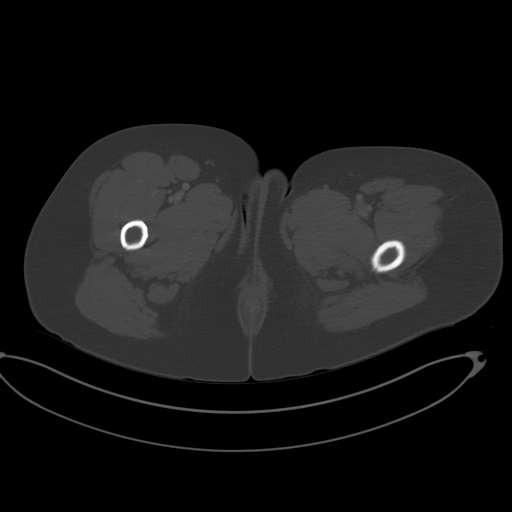
[im 14/88  soft-tissue]
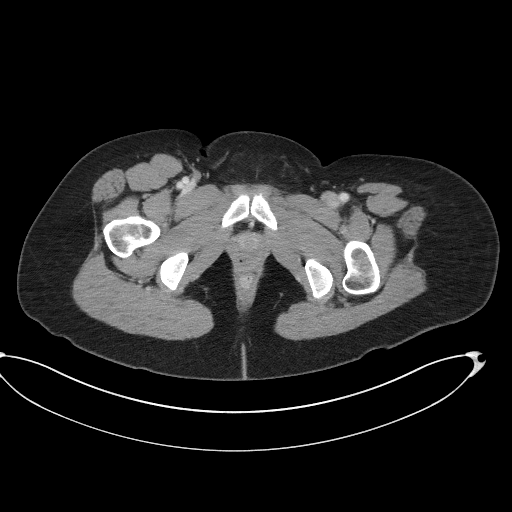
[im 19/88  soft-tissue]
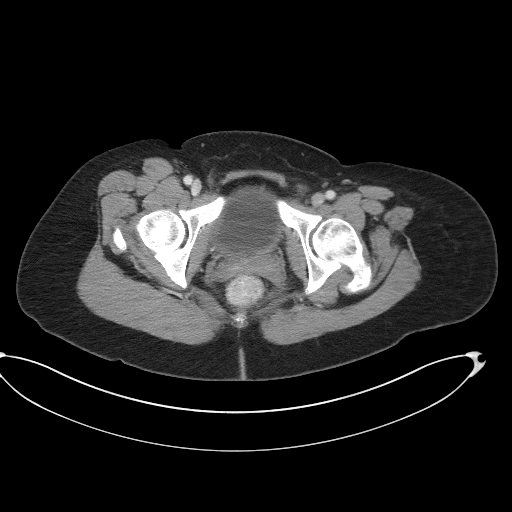
[im 28/88  soft-tissue]
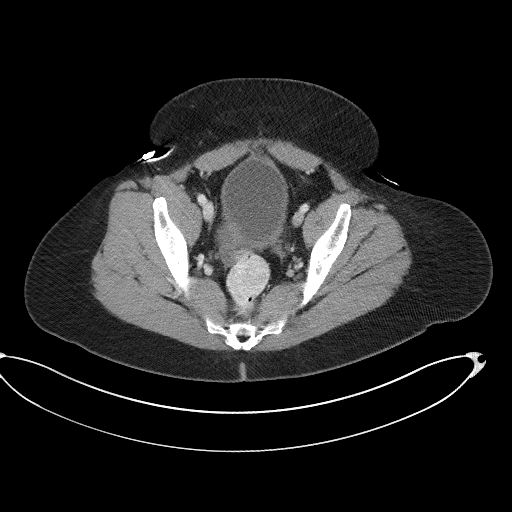
[im 33/88  soft-tissue]
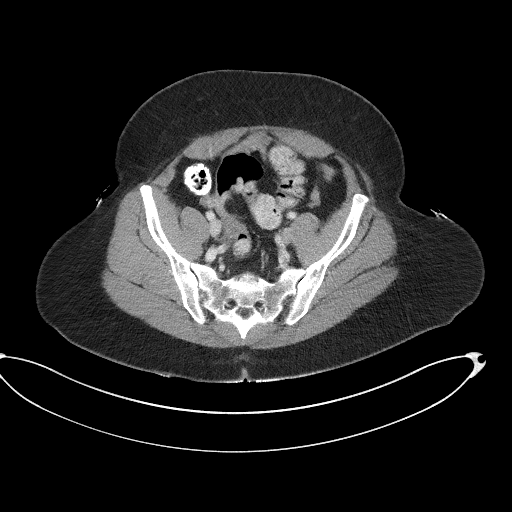
[im 42/88  soft-tissue]
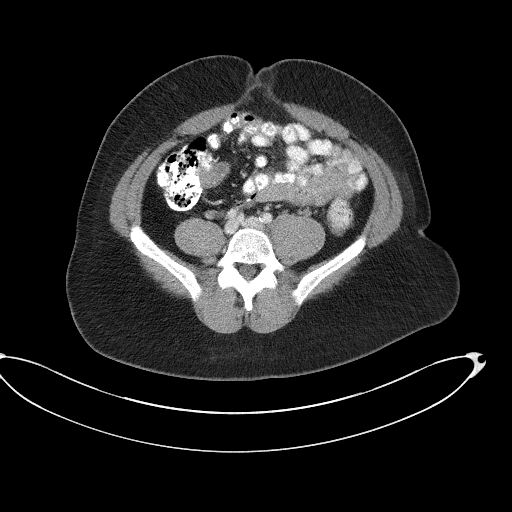
[im 46/88  soft-tissue]
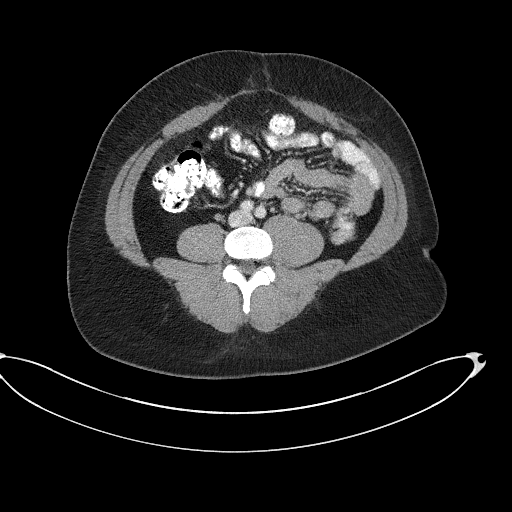
[im 55/88  soft-tissue]
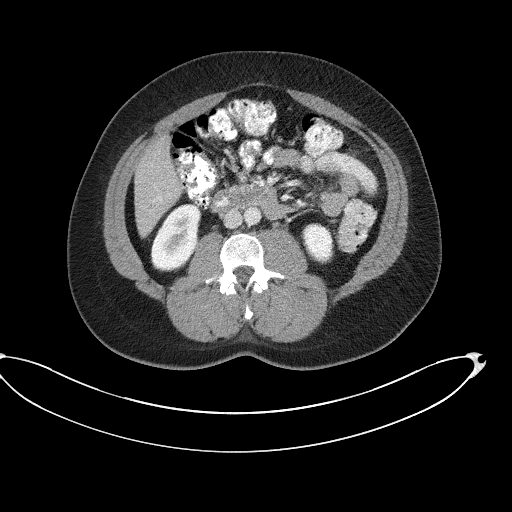
[im 60/88  soft-tissue]
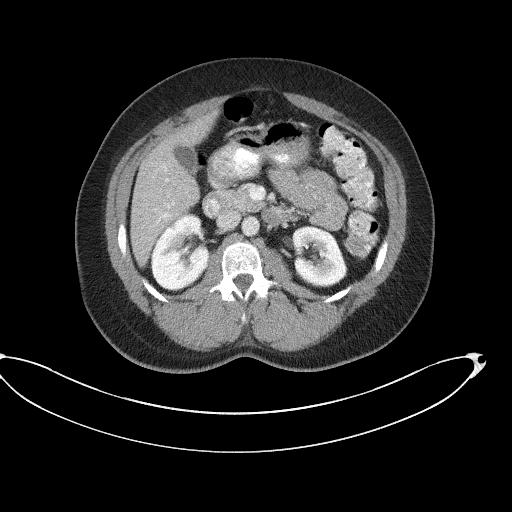
[im 60/88  bone]
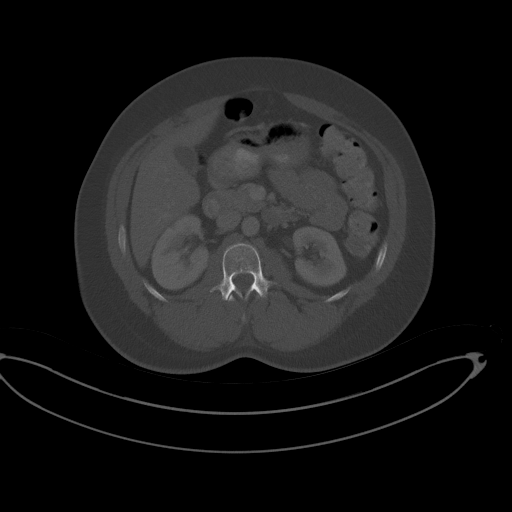
[im 69/88  soft-tissue]
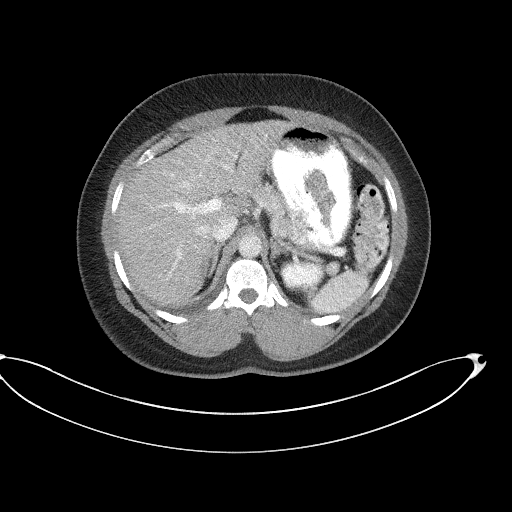
[im 74/88  soft-tissue]
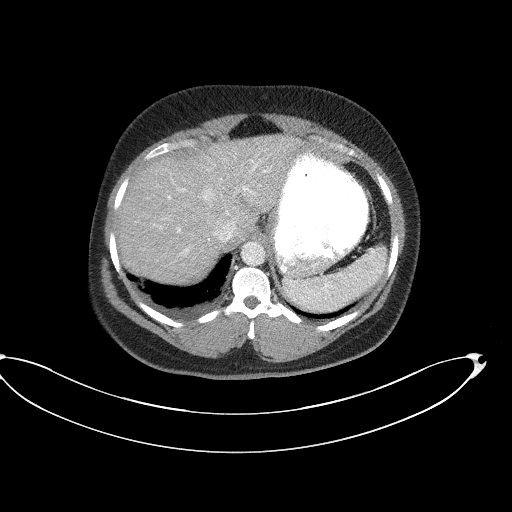
[im 83/88  soft-tissue]
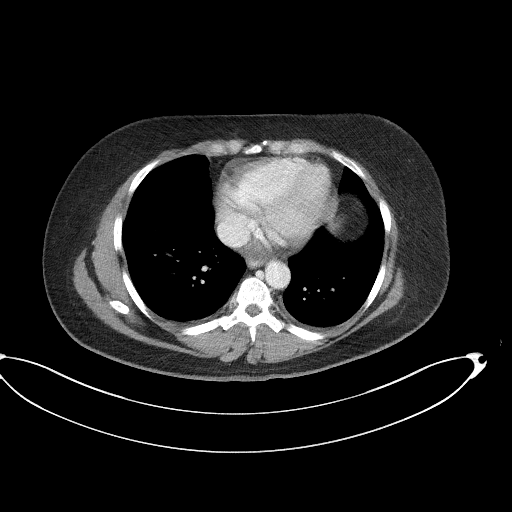

[Series 4: coronal st · coronal · 0.86mm/px · 3 of 104 slices shown]
[im 35/104  soft-tissue]
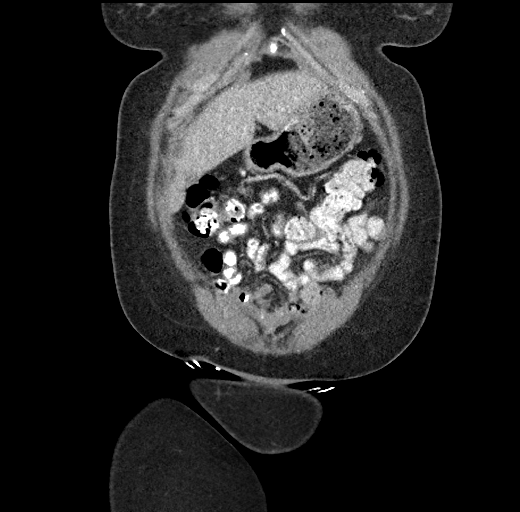
[im 46/104  soft-tissue]
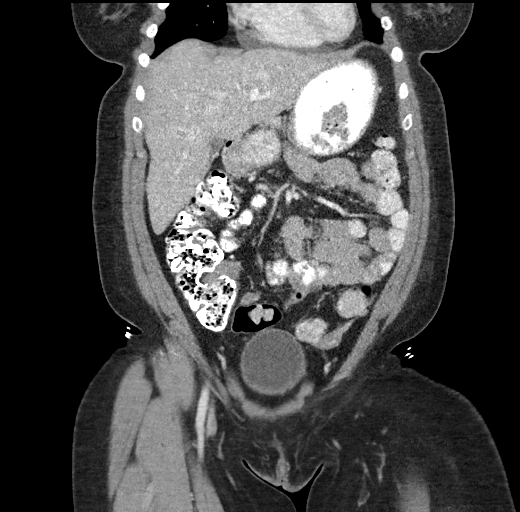
[im 58/104  soft-tissue]
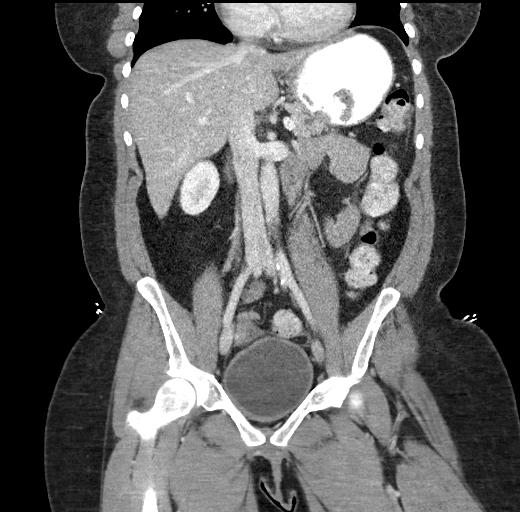

[15 of 46 positions shown; findings below may reference images not displayed]

FINDINGS: Lower chest: There is a small right pleural effusion. There is no
lung base edema or consolidation.

Hepatobiliary: There is hepatic steatosis. No focal liver lesions
are appreciable. Gallbladder wall is not appreciably thickened.
There is no biliary duct dilatation.

Pancreas: There is a cystic appearing lesion in the body of the
pancreas with a questionable septation in this area measuring 1.9 x
1.1 cm. No other pancreatic lesions are evident.

Spleen: No splenic lesions are appreciable.

Adrenals/Urinary Tract: Adrenals bilaterally appear unremarkable.
Kidneys bilaterally show no evident mass or hydronephrosis on either
side. There is no evident renal or ureteral calculus on either side.
Urinary bladder is midline with wall thickness within normal limits.

Stomach/Bowel: There is no appreciable bowel wall or mesenteric
thickening. No evident bowel obstruction. Terminal ileum appears
normal. No evident free air or portal venous air.

Vascular/Lymphatic: No abdominal aortic aneurysm. There are foci of
aortic and iliac artery atherosclerotic calcification. Major venous
structures appear patent. No evident adenopathy in the abdomen or
pelvis.

Reproductive: Uterus is absent. There is an apparent dominant
follicle in the right ovary measuring 2.9 x 2.5 cm. On the left,
there is fat in the adnexal region measuring 2.8 x 1.6 cm,
potentially representing a small left ovarian dermoid. There is
loculated fluid in the left adnexal region. This fluid tracks along
the leftward aspect of the sigmoid colon, abutting the sigmoid colon
but not causing thickening in this area. No other pelvic mass
evident.

Other: Appendix appears normal. No evident abscess or ascites in the
abdomen or pelvis. There is a focal umbilical hernia containing fat
but no bowel. No evident bowel containing hernia.

Musculoskeletal: No blastic or lytic bone lesions. No intramuscular
lesions are evident.
IMPRESSION: 1. Uterus absent. Question small left ovarian dermoid measuring
x 1.6 cm. There is nearby apparent loculated fluid which tracks
along the sigmoid colon on the left without causing bowel wall
thickening. Question hemorrhagic fluid in this area. Correlation
with pelvic ultrasound advised given this finding. Apparent dominant
follicle right ovary noted.

2. Cystic mass in the body of the pancreas measuring 1.9 x 1.1 cm.
Communication with the main pancreatic duct has not been established
with this study. This mass warrants additional surveillance.
Pancreatic MR pre and post-contrast would be the optimum study of
choice to further evaluate. If there is a contraindication to MR,
would advise pre and postcontrast CT targeted to the appendix.

3. No appreciable bowel wall thickening. No diverticulitis evident.
No bowel obstruction. No abscess in the abdomen or pelvis. Appendix
appears normal.

4.  Periumbilical hernia containing fat but no bowel.

5.  Hepatic steatosis.

6.  Aortic and iliac artery atherosclerosis.

Aortic Atherosclerosis ([4T]-[4T]).

These results will be called to the ordering clinician or
representative by the Radiologist Assistant, and communication
documented in the PACS or zVision Dashboard.

## 2019-12-19 MED ORDER — IOHEXOL 300 MG/ML  SOLN
100.0000 mL | Freq: Once | INTRAMUSCULAR | Status: AC | PRN
  Administered 2019-12-19: 100 mL via INTRAVENOUS

## 2019-12-19 NOTE — Telephone Encounter (Signed)
Opened to order pelvic US.

## 2019-12-19 NOTE — Telephone Encounter (Signed)
Received a phone call from [place] at [place] for a FYI call regarding patients impression on CT.Please Advise  IMPRESSION: 1. Uterus absent. Question small left ovarian dermoid measuring 2.8 x 1.6 cm. There is nearby apparent loculated fluid which tracks along the sigmoid colon on the left without causing bowel wall thickening. Question hemorrhagic fluid in this area. Correlation with pelvic ultrasound advised given this finding. Apparent dominant follicle right ovary noted.  2. Cystic mass in the body of the pancreas measuring 1.9 x 1.1 cm. Communication with the main pancreatic duct has not been established with this study. This mass warrants additional surveillance. Pancreatic MR pre and post-contrast would be the optimum study of choice to further evaluate. If there is a contraindication to MR, would advise pre and postcontrast CT targeted to the appendix.  3. No appreciable bowel wall thickening. No diverticulitis evident. No bowel obstruction. No abscess in the abdomen or pelvis. Appendix appears normal.  4.  Periumbilical hernia containing fat but no bowel.  5.  Hepatic steatosis.  6.  Aortic and iliac artery atherosclerosis.  Aortic Atherosclerosis (ICD10-I70.0).

## 2019-12-19 NOTE — Addendum Note (Signed)
Addended by: Caffie Pinto on: 12/19/2019 11:25 AM   Modules accepted: Orders

## 2019-12-19 NOTE — Telephone Encounter (Signed)
Open to review.  

## 2019-12-19 NOTE — Telephone Encounter (Signed)
Thanks for sending me the result. I sent you result notes. Pretty lengthy and complicated report. Need to know how much pain she is in. Might need to have phone virtual visit follow up tomorrow. Or might need to see her Thursday morning when I am in office to discuss. Pretty complicated report.   If any extreme/unbearable type pain pending more in depth imaging then recommend ED evaluation at [facility].

## 2019-12-19 NOTE — Telephone Encounter (Signed)
Opened to review. Was able to talk to pt. Explained results notes. Also talked with radiologist to clarify how to order mr abdomen.

## 2019-12-19 NOTE — Telephone Encounter (Signed)
Placed MR abdomen. Assuming it needs prior authorization. Can you coordinate with radiology at [facility] on appointment. Also see the mri order. I had question on protocol of prednisone and benadryl prior to study. Once you get authrorization if you could get me [facility]ology dept then I could ask about her allergy hx and meds she needs before.  Thanks

## 2019-12-20 ENCOUNTER — Ambulatory Visit (HOSPITAL_BASED_OUTPATIENT_CLINIC_OR_DEPARTMENT_OTHER)
Admission: RE | Admit: 2019-12-20 | Discharge: 2019-12-20 | Disposition: A | Discharge status: Home / Self Care | Payer: No Typology Code available for payment source | Source: Ambulatory Visit | Attending: Medical | Admitting: Medical

## 2019-12-20 ENCOUNTER — Other Ambulatory Visit: Payer: Self-pay

## 2019-12-20 ENCOUNTER — Encounter: Payer: Self-pay | Admitting: Medical

## 2019-12-20 DIAGNOSIS — R102 Pelvic and perineal pain: Secondary | ICD-10-CM | POA: Diagnosis not present

## 2019-12-20 LAB — URINE CULTURE
MICRO NUMBER:: 10008237
SPECIMEN QUALITY:: ADEQUATE

## 2019-12-20 IMAGING — US US PELVIS COMPLETE WITH TRANSVAGINAL
1 series · 14 of 25 positions shown · non-contrast
Comparison: None

CLINICAL DATA: Pelvic pain.

EXAM:
TRANSABDOMINAL AND TRANSVAGINAL ULTRASOUND OF PELVIS
TECHNIQUE: Both transabdominal and transvaginal ultrasound examinations of the
pelvis were performed. Transabdominal technique was performed for
global imaging of the pelvis including uterus, ovaries, adnexal
regions, and pelvic cul-de-sac. It was necessary to proceed with
endovaginal exam following the transabdominal exam to visualize the
endometrium and ovaries.

[Series 1: us pelvis complete with transvaginal · 29 acquisitions, 14 frames shown]
[im 1/29]
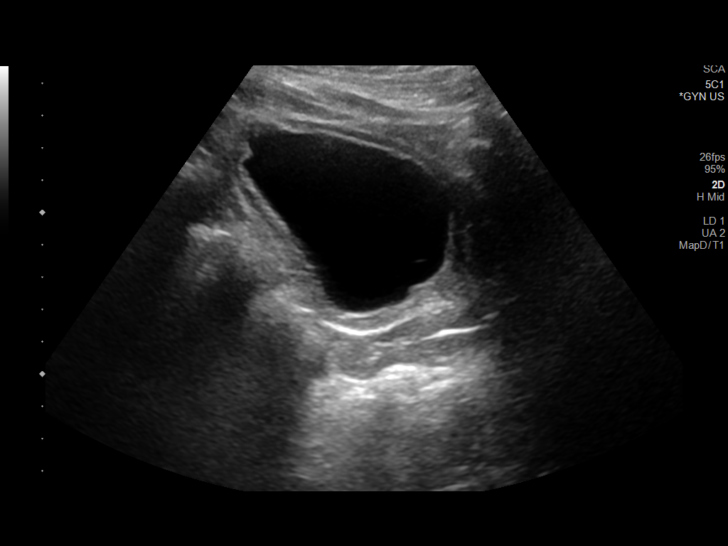
[im 3/29]
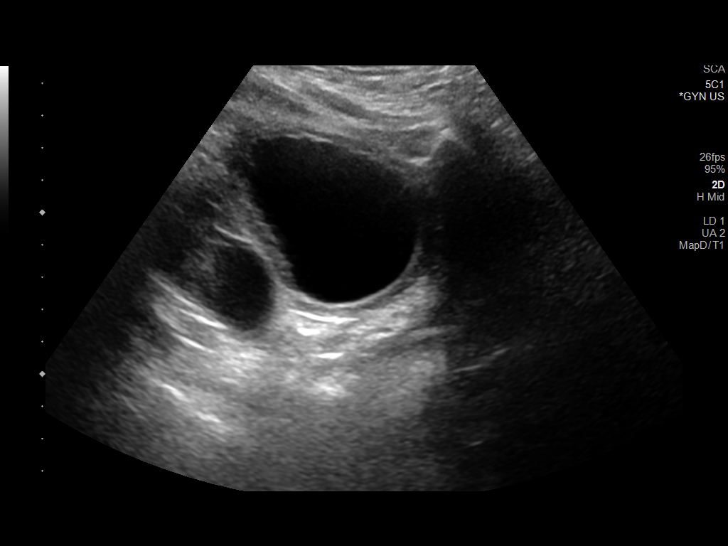
[im 5/29]
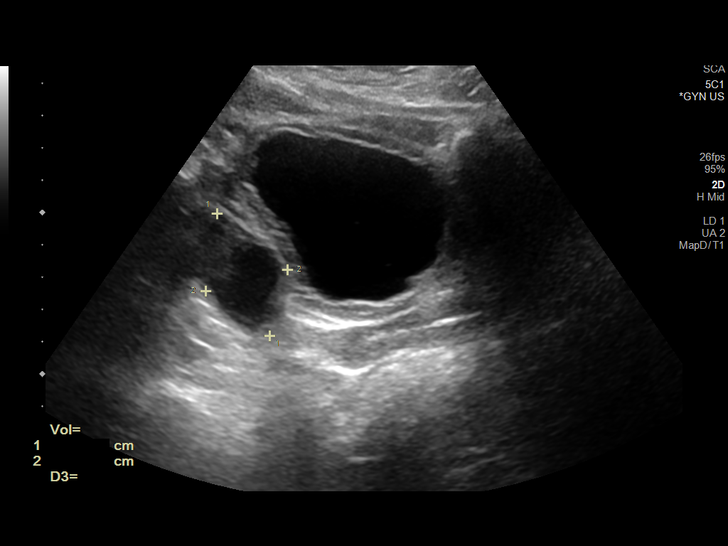
[im 8/29]
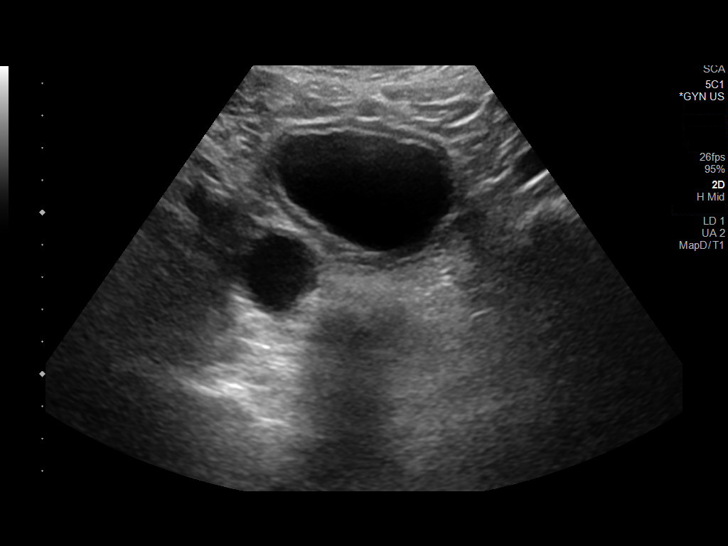
[im 10/29]
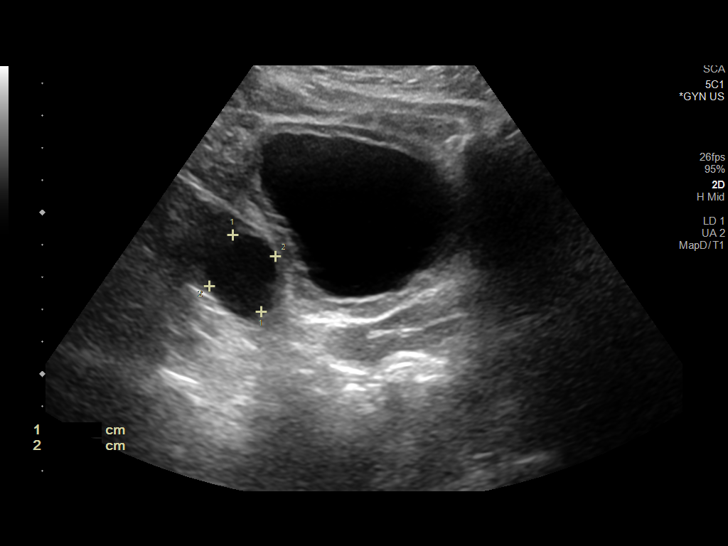
[im 11/29]
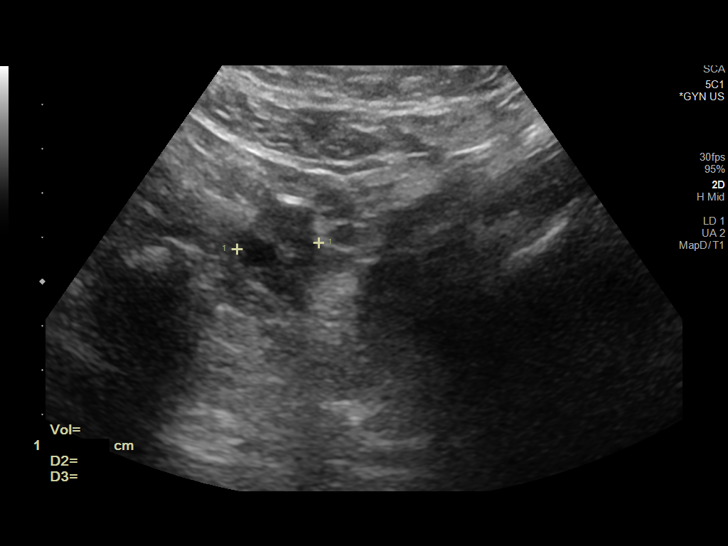
[im 13/29]
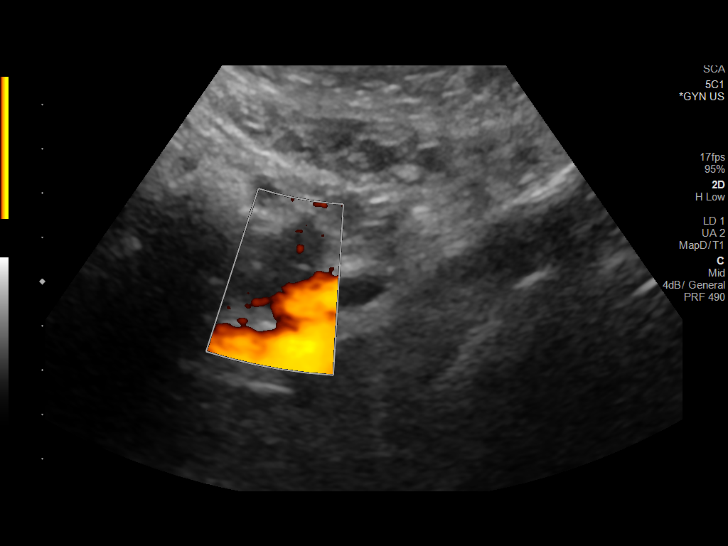
[im 16/29]
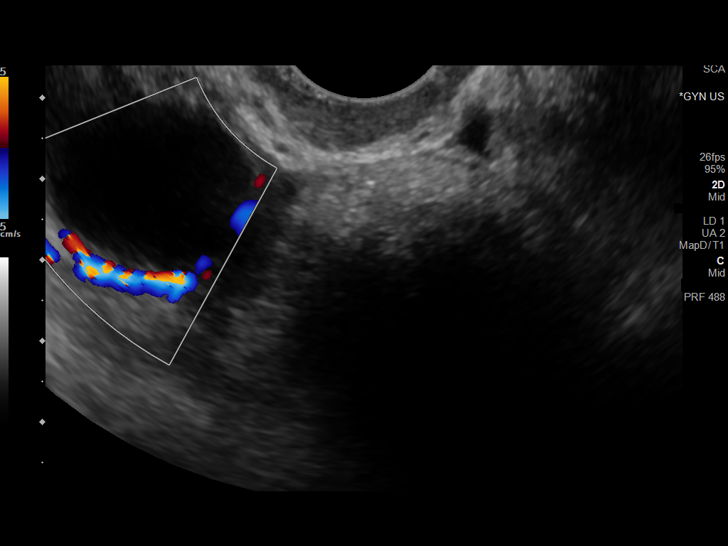
[im 18/29]
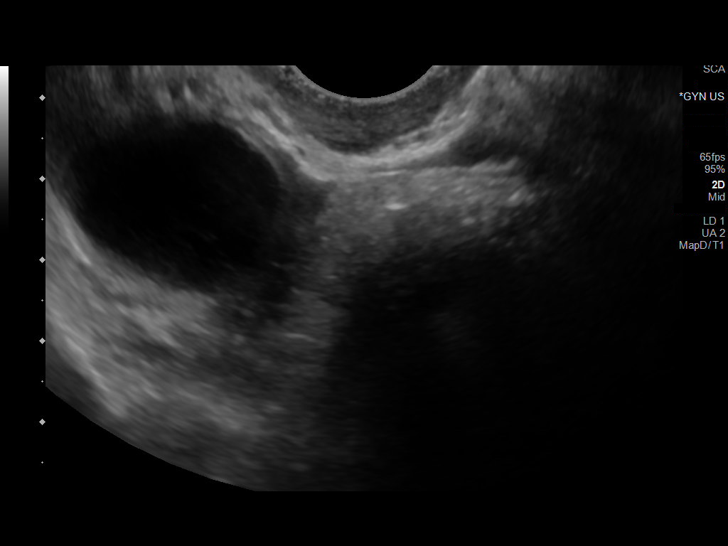
[im 19/29]
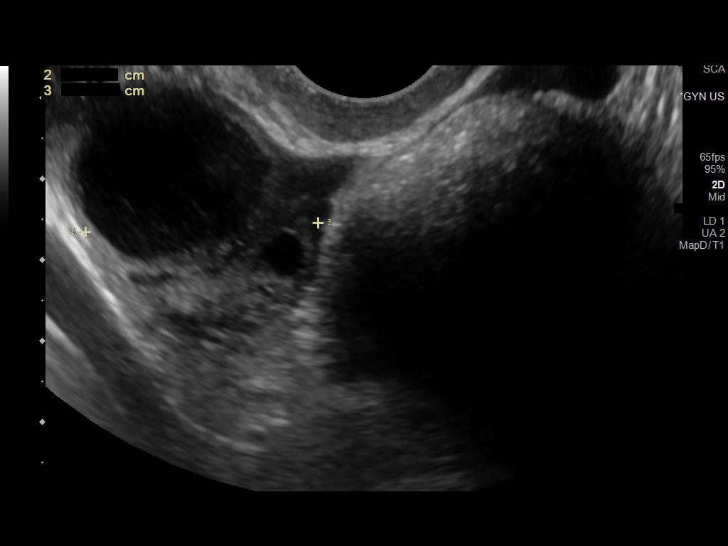
[im 22/29]
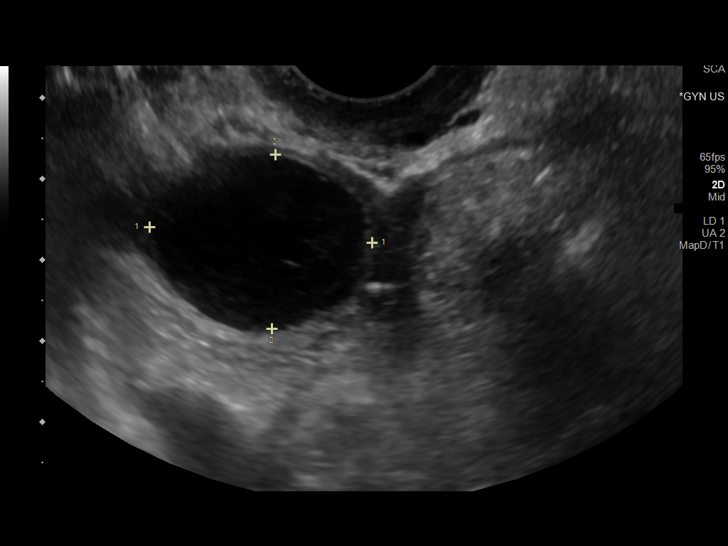
[im 24/29]
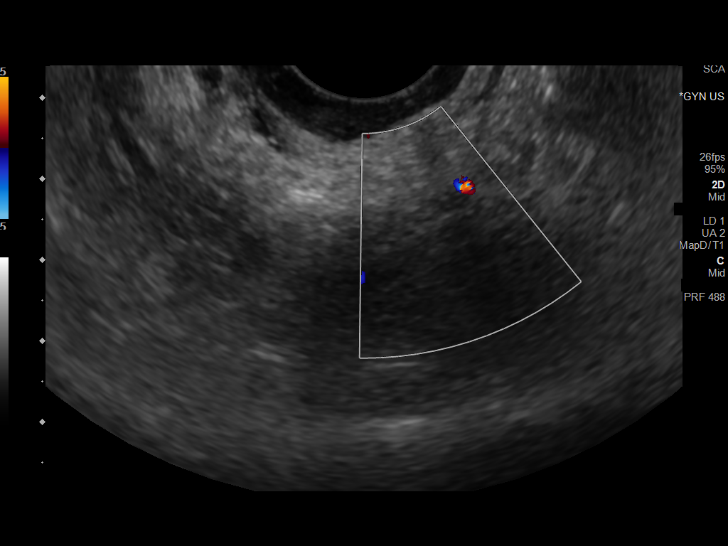
[im 26/29]
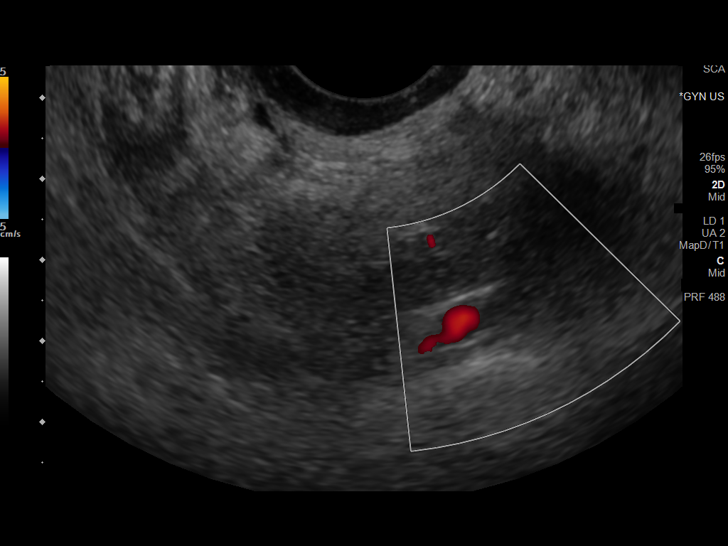
[im 29/29]
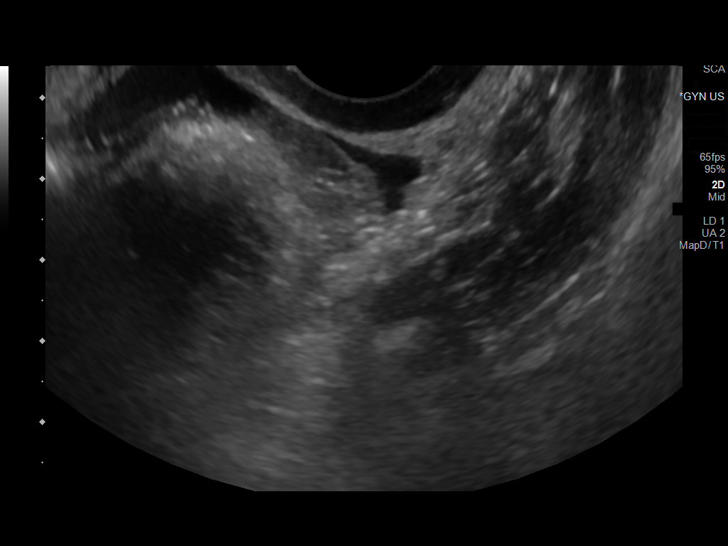

[14 of 25 positions shown; findings below may reference images not displayed]

FINDINGS: Uterus

Prior hysterectomy.

Right ovary

Measurements: 4.1 x 2.6 x 2.3 cm = volume: 13.9 mL. Normal
appearance/no adnexal mass.

Left ovary

Measurements: 2.4 x 1.9 x 1.7 cm = volume: 3.9 mL. Normal
appearance/no adnexal mass.

Other findings

No abnormal free fluid.
IMPRESSION: 1. Prior hysterectomy.  Otherwise normal pelvic ultrasound.

## 2019-12-20 NOTE — Telephone Encounter (Signed)
Provider spoke with patient. Patient is doing fine explained if in any pain to go to ED for further eval

## 2019-12-20 NOTE — Telephone Encounter (Signed)
Service Order # [id number] submitted to [employer] via fax to [phone] - awaiting determination for MRI

## 2019-12-21 ENCOUNTER — Other Ambulatory Visit: Payer: Self-pay | Admitting: Medical

## 2019-12-21 ENCOUNTER — Telehealth: Payer: Self-pay | Admitting: Medical

## 2019-12-21 ENCOUNTER — Encounter: Payer: Self-pay | Admitting: Internal Medicine

## 2019-12-21 ENCOUNTER — Encounter: Payer: Self-pay | Admitting: Medical

## 2019-12-21 ENCOUNTER — Other Ambulatory Visit (INDEPENDENT_AMBULATORY_CARE_PROVIDER_SITE_OTHER): Payer: Medicare Other

## 2019-12-21 ENCOUNTER — Other Ambulatory Visit: Payer: Self-pay | Admitting: Emergency Medicine

## 2019-12-21 DIAGNOSIS — K862 Cyst of pancreas: Secondary | ICD-10-CM | POA: Diagnosis not present

## 2019-12-21 DIAGNOSIS — R1031 Right lower quadrant pain: Secondary | ICD-10-CM | POA: Diagnosis not present

## 2019-12-21 LAB — LIPASE: Lipase: 24 U/L (ref 11.0–59.0)

## 2019-12-21 MED ORDER — DIPHENHYDRAMINE HCL 25 MG PO TABS: ORAL_TABLET | ORAL | 0 refills | Status: DC

## 2019-12-21 MED ORDER — PREDNISONE 50 MG PO TABS: ORAL_TABLET | ORAL | 0 refills | Status: DC

## 2019-12-21 NOTE — Telephone Encounter (Signed)
Pt will get lipase today. Mri tomorrow.

## 2019-12-21 NOTE — Telephone Encounter (Signed)
Lipase placed today.will call pt and explain to get today. And pick up prednisone and benadryl.

## 2019-12-21 NOTE — Telephone Encounter (Signed)
Opened in error, need to future order lab work, work Sales executive

## 2019-12-21 NOTE — Telephone Encounter (Signed)
Rx prednisone and benadryl sent to pt pharmacy. Will advise her on taking before mri.

## 2019-12-22 ENCOUNTER — Other Ambulatory Visit: Payer: Self-pay | Admitting: Medical

## 2019-12-22 ENCOUNTER — Telehealth: Payer: Self-pay | Admitting: Medical

## 2019-12-22 ENCOUNTER — Other Ambulatory Visit: Payer: Self-pay

## 2019-12-22 ENCOUNTER — Ambulatory Visit (HOSPITAL_COMMUNITY)
Admission: RE | Admit: 2019-12-22 | Discharge: 2019-12-22 | Disposition: A | Discharge status: Home / Self Care | Payer: No Typology Code available for payment source | Source: Ambulatory Visit | Attending: Medical | Admitting: Medical

## 2019-12-22 DIAGNOSIS — K862 Cyst of pancreas: Secondary | ICD-10-CM

## 2019-12-22 DIAGNOSIS — K8689 Other specified diseases of pancreas: Secondary | ICD-10-CM | POA: Diagnosis present

## 2019-12-22 IMAGING — MR MR ABDOMEN WO/W CM
11 of 20 series · 20 of 48 positions shown · IV contrast (gadavist)
Comparison: [DATE] CT abdomen/pelvis.

CLINICAL DATA: Cystic pancreatic lesion on recent CT.

EXAM:
MRI ABDOMEN WITHOUT AND WITH CONTRAST
TECHNIQUE: Multiplanar multisequence MR imaging of the abdomen was performed
both before and after the administration of intravenous contrast.
CONTRAST:  7.5mL GADAVIST GADOBUTROL 1 MMOL/ML IV SOLN

[Series 3: T2 fat-sat · axial · 5.0mm · 0.82mm/px · 1 of 45 slices shown]
[im 1/45]
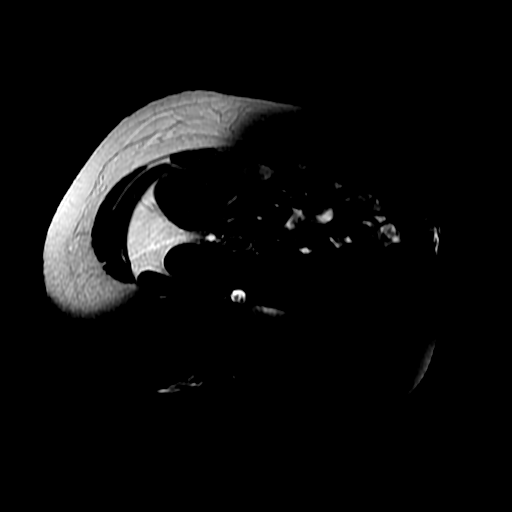

[Series 4: DWI b500 · axial · 6.0mm · 1.48mm/px · 1 of 59 slices shown]
[im 1/59]
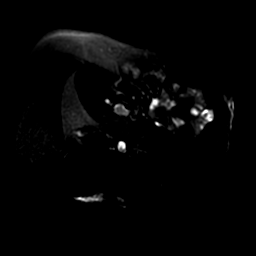

[Series 5: T2 · axial · 5.0mm · 0.82mm/px · 1 of 47 slices shown (1 of 2)]
[im 1/47]
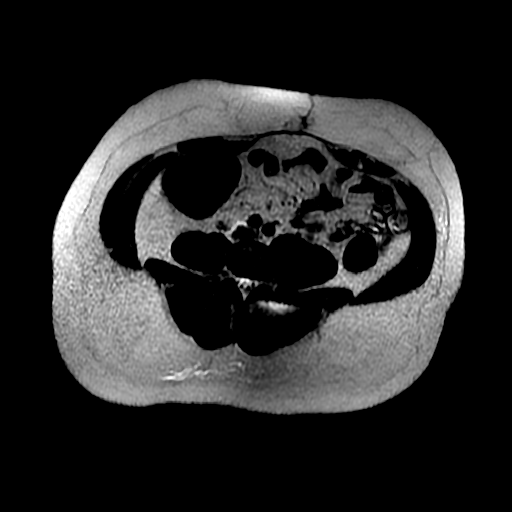

[Series 6: bSSFP · coronal · 5.0mm · 0.78mm/px · 1 of 43 slices shown]
[im 1/43]
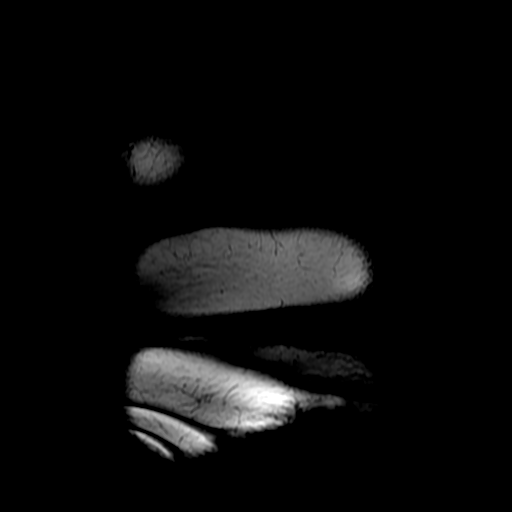

[Series 7: T2 · coronal · 5.0mm · 0.78mm/px · 1 of 43 slices shown (2 of 2)]
[im 1/43]
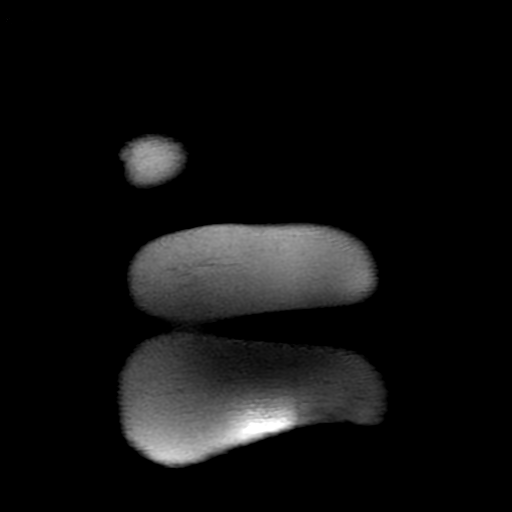

[Series 9: ax dualecho bh · axial · 5.0mm · 0.82mm/px · z∈[-127,+103]mm · 2 of 94 slices shown]
[im 1/94]
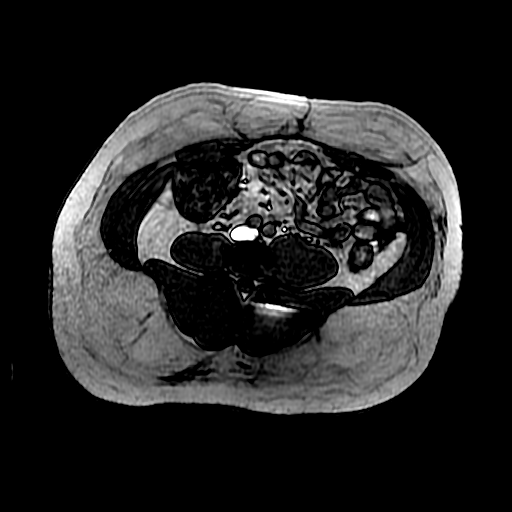
[im 94/94]
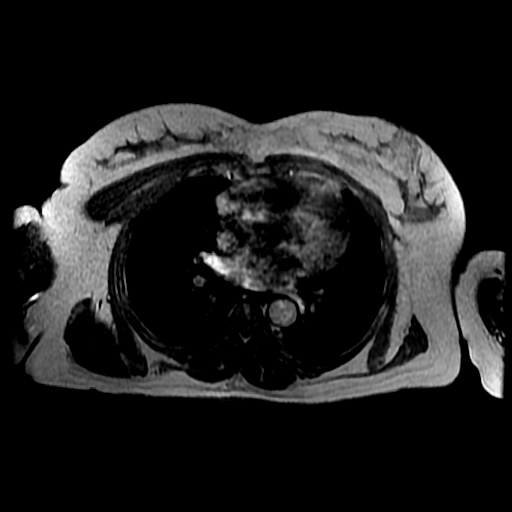

[Series 400: DWI · axial · 6.0mm · 1.48mm/px · 1 of 30 slices shown]
[im 1/30]
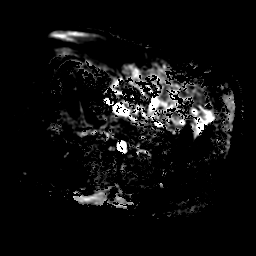

[Series 800: reformatted · axial · 1.8mm · 0.66mm/px · z∈[-17,+113]mm · 4 of 117 slices shown]
[im 1/117]
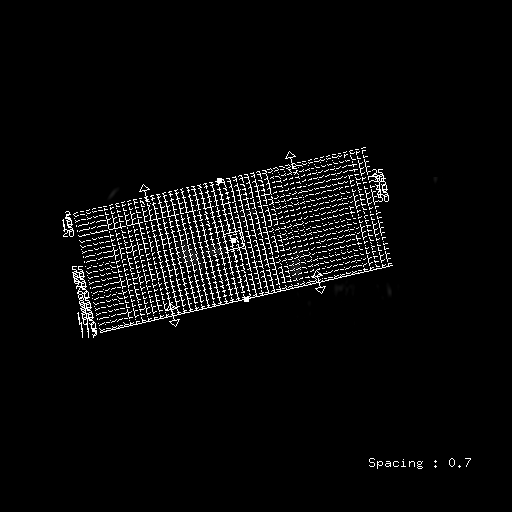
[im 39/117]
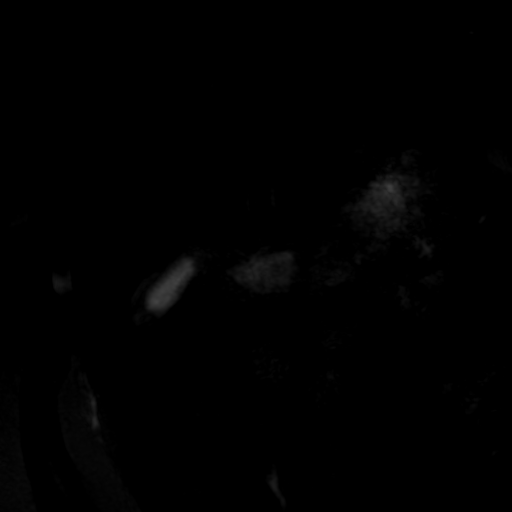
[im 78/117]
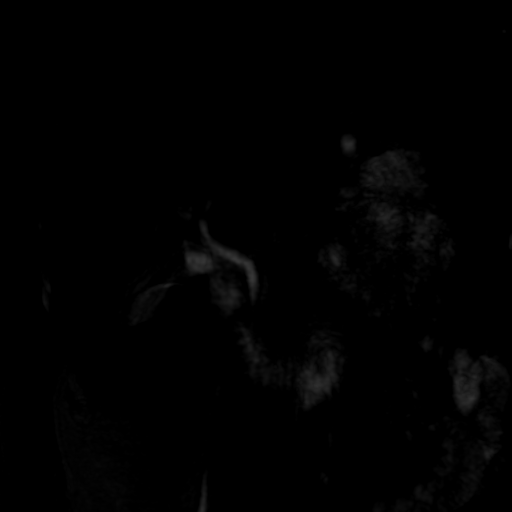
[im 117/117]
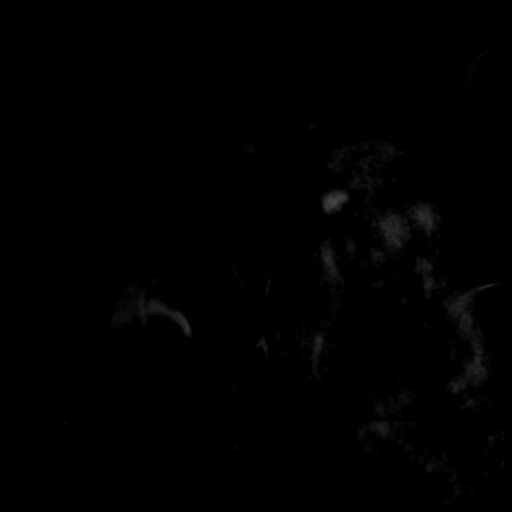

[Series 1000: T1 dynamic · axial · 5.6mm · 0.82mm/px · z∈[-139,+104]mm · 3 of 88 slices shown (1 of 3)]
[im 1/88]
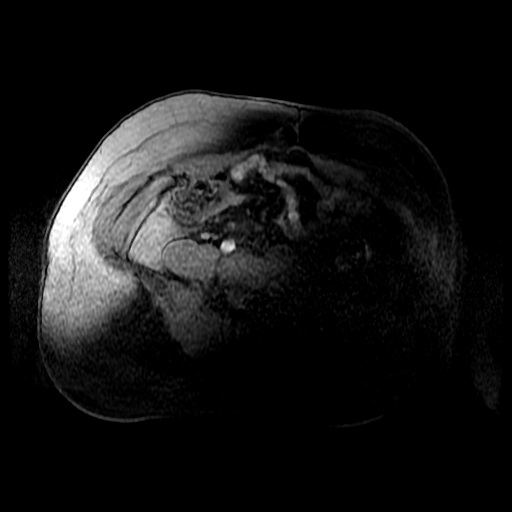
[im 44/88]
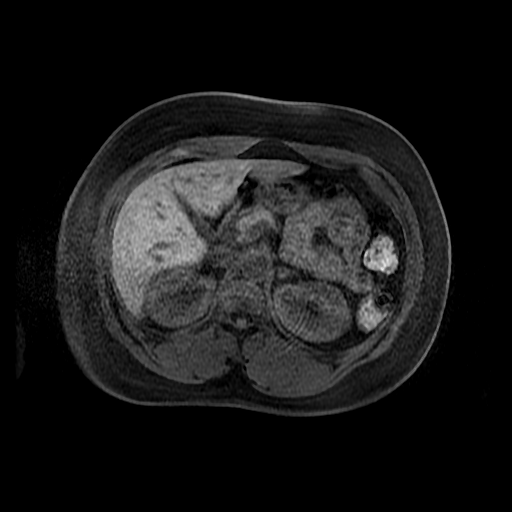
[im 88/88]
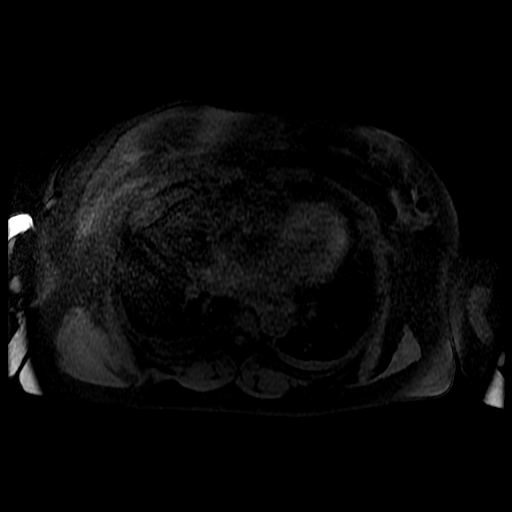

[Series 1001: T1 dynamic · axial · 5.6mm · 0.82mm/px · z∈[-139,+104]mm · 3 of 88 slices shown (2 of 3)]
[im 1/88]
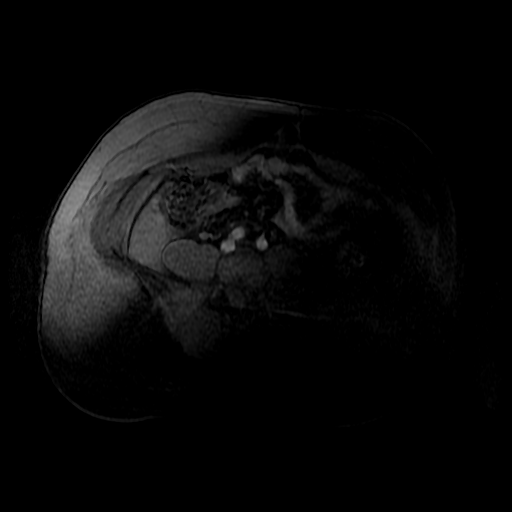
[im 44/88]
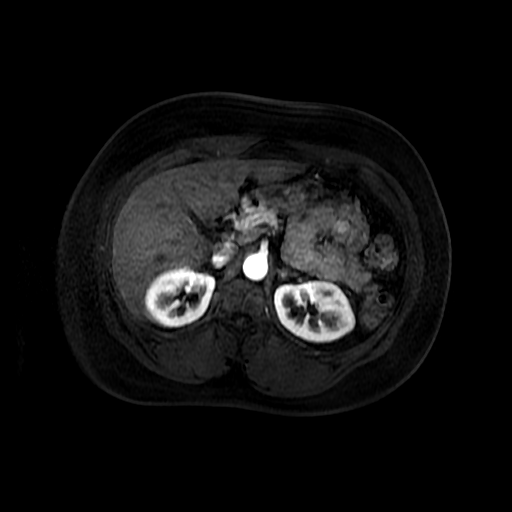
[im 88/88]
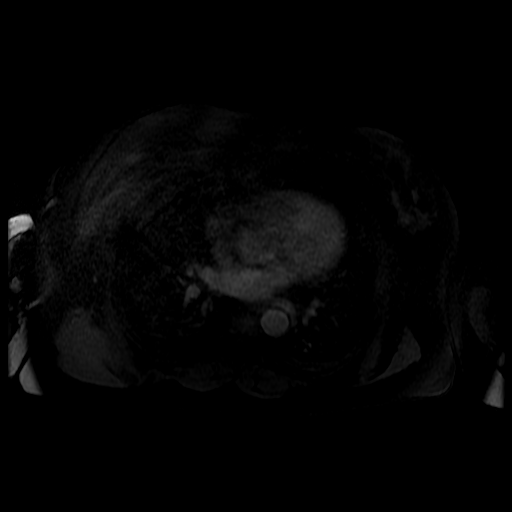

[Series 1002: T1 dynamic · axial · 5.6mm · 0.82mm/px · z∈[-139,-19]mm · 2 of 88 slices shown (3 of 3)]
[im 1/88]
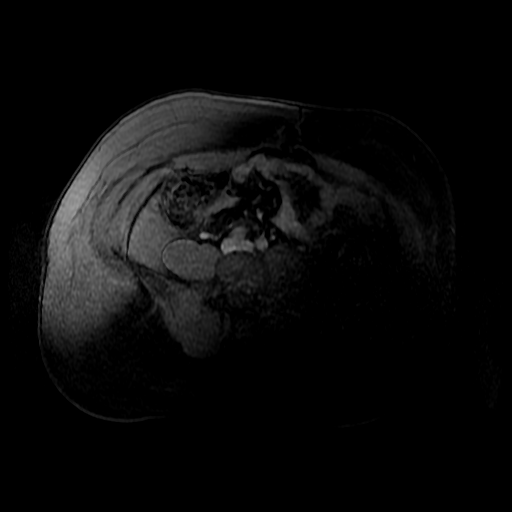
[im 44/88]
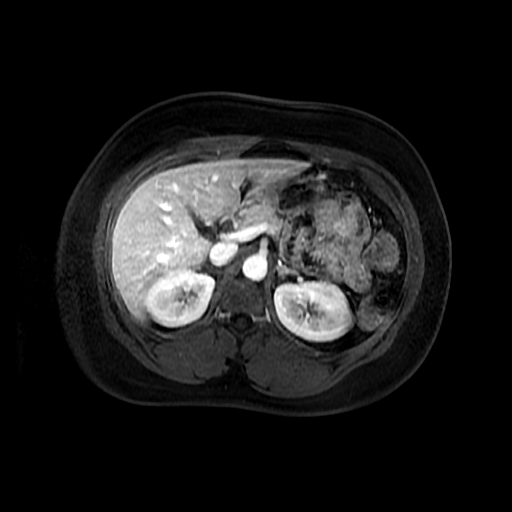

[20 of 48 positions shown; findings below may reference images not displayed]

FINDINGS: Lower chest: Trace dependent bilateral pleural effusions.

Hepatobiliary: Normal liver size and configuration. Mild diffuse
hepatic steatosis. Subcentimeter simple segment 2 left liver lobe
cyst. No additional liver lesions. Normal gallbladder with no
cholelithiasis. No biliary ductal dilatation. Common bile duct
diameter 3 mm. No choledocholithiasis. No biliary strictures, masses
or beading.

Pancreas: There is a lobulated cystic 1.6 x 1.3 cm pancreatic body
lesion with thin internal septation and no wall thickening or solid
enhancement (series 3/image 18). No obvious communication with the
pancreatic duct. No additional pancreatic lesions. No pancreatic
duct dilation. No pancreas divisum.

Spleen: Normal size. No mass.

Adrenals/Urinary Tract: Normal adrenals. No hydronephrosis.
Subcentimeter simple renal cyst in the anterior interpolar right
kidney. No suspicious renal masses.

Stomach/Bowel: Normal non-distended stomach. Visualized small and
large bowel is normal caliber, with no bowel wall thickening.

Vascular/Lymphatic: Normal caliber abdominal aorta. Patent portal,
splenic, hepatic and renal veins. No pathologically enlarged lymph
nodes in the abdomen.

Other: No abdominal ascites or focal fluid collection.

Musculoskeletal: No aggressive appearing focal osseous lesions.
IMPRESSION: 1. Nonenhancing cystic 1.6 cm pancreatic body lesion without high
risk MRI features. Leading considerations include pancreatic
pseudocyst or side branch IPMN. No pancreatic or biliary ductal
dilatation. Follow-up MRI abdomen without and with IV contrast
recommended in 6 months. This recommendation follows ACR consensus
guidelines: Management of Incidental Pancreatic Cysts: A White Paper
of the ACR Incidental Findings Committee. [HOSPITAL]
2. Mild diffuse hepatic steatosis.
3. Trace dependent bilateral pleural effusions.

## 2019-12-22 MED ORDER — PREDNISONE 50 MG PO TABS: ORAL_TABLET | ORAL | 0 refills | Status: DC

## 2019-12-22 MED ORDER — GADOBUTROL 1 MMOL/ML IV SOLN
7.5000 mL | Freq: Once | INTRAVENOUS | Status: AC | PRN
  Administered 2019-12-22: 7.5 mL via INTRAVENOUS

## 2019-12-22 NOTE — Telephone Encounter (Signed)
Not sure why epic brought up duplicate rx note about prednisone. I recent the rx since pt has procedure tonight and needs the rx. Has to take before the procedure. Resent in event rx was accidentally printed? She had CT done early in week so needed rx then as well due to allergy history.

## 2019-12-22 NOTE — Telephone Encounter (Signed)
Opened to send prednisone again.

## 2019-12-22 NOTE — Telephone Encounter (Signed)
Resolved

## 2019-12-22 NOTE — Telephone Encounter (Signed)
Called pt and notified resent. Saw note and it looks like yesterday rx printed. Advised [facility]. Then follow insrtuction the same. Pt expressed understaning.

## 2019-12-22 NOTE — Telephone Encounter (Signed)
Confirmed pt picked up rx 2 hours ago and started prednisone as instructed.

## 2019-12-23 ENCOUNTER — Other Ambulatory Visit: Payer: Self-pay | Admitting: Internal Medicine

## 2019-12-23 ENCOUNTER — Telehealth: Payer: Self-pay | Admitting: Medical

## 2019-12-23 ENCOUNTER — Encounter: Payer: Self-pay | Admitting: Medical

## 2019-12-23 DIAGNOSIS — K862 Cyst of pancreas: Secondary | ICD-10-CM

## 2019-12-23 DIAGNOSIS — R1031 Right lower quadrant pain: Secondary | ICD-10-CM

## 2019-12-23 DIAGNOSIS — K8689 Other specified diseases of pancreas: Secondary | ICD-10-CM

## 2019-12-23 NOTE — Telephone Encounter (Signed)
Referral to GI placed

## 2019-12-26 ENCOUNTER — Encounter: Payer: Self-pay | Admitting: Physical Medicine & Rehabilitation

## 2019-12-26 ENCOUNTER — Encounter: Payer: 59 | Attending: Physical Medicine & Rehabilitation | Admitting: Physical Medicine & Rehabilitation

## 2019-12-26 ENCOUNTER — Other Ambulatory Visit: Payer: Self-pay

## 2019-12-26 VITALS — BP 119/81 | HR 74 | Temp 97.7°F | Ht 59.0 in | Wt 165.0 lb

## 2019-12-26 DIAGNOSIS — I619 Nontraumatic intracerebral hemorrhage, unspecified: Secondary | ICD-10-CM

## 2019-12-26 DIAGNOSIS — M65311 Trigger thumb, right thumb: Secondary | ICD-10-CM | POA: Insufficient documentation

## 2019-12-26 DIAGNOSIS — G8114 Spastic hemiplegia affecting left nondominant side: Secondary | ICD-10-CM

## 2019-12-26 DIAGNOSIS — I69154 Hemiplegia and hemiparesis following nontraumatic intracerebral hemorrhage affecting left non-dominant side: Secondary | ICD-10-CM | POA: Diagnosis not present

## 2019-12-26 NOTE — Progress Notes (Addendum)
Dysport: Procedure Note Patient Name: Abigail Campos DOB: [birth year] MRN: [id number] Date: 09/25/2019  Procedure: Botulinum toxin administration Guidance: EMG Diagnosis: Spastic left nondominant IPH Attending: Delice Lesch, MD   Informed consent: Risks, benefits & options of the procedure are explained to the patient (and/or family). The patient elects to proceed with procedure. Risks include but are not limited to weakness, respiratory distress, dry mouth, ptosis, antibody formation, worsening of some areas of function. Benefits include decreased abnormal muscle tone, improved hygiene and positioning, decreased skin breakdown and, in some cases, decreased pain. Options include conservative management with oral antispasticity agents, phenol chemodenervation of nerve or at motor nerve branches. More invasive options include intrathecal balcofen adminstration for appropriate candidates. Surgical options may include tendon lengthening or transposition or, rarely, dorsal rhizotomy.   History/Physical Examination: 49 y.o.  Year old right-handed female with history of hypertension, migraine headaches presents for follow up after right basal ganglia hemorrhage.   Modified [place]: Left 1/4 elbow flexors, 1+/4 wrist flexors, 3/4 left finger flexors, 1+/4 ankle plantar flexors.  Previous Treatments: Therapy/Range of motion Indication for guidance: Target active muscules  Procedure: Botulinum toxin was mixed with preservative free saline with a dilution of 0.5cc to 100 units. Targeted limb and muscles were identified. The skin was prepped with alcohol swabs and placement of needle tip in targeted muscle was confirmed using appropriate guidance. Prior to injection, positioning of needle tip outside of blood vessel was determined by pulling back on syringe plunger.  MUSCLE UNITS  Left Med biceps: 100 units Left FCR: 150 units Left FCU: 150 units Left FDS: 300 units Left FDP: 300 units  Left Med  gastroc: 150 units  Left Lat gastroc: 150 units   Total units: [id number]  Complications: None  Abigail Campos Abigail Campos 10:33 AM

## 2020-01-31 ENCOUNTER — Other Ambulatory Visit: Payer: Self-pay | Admitting: Internal Medicine

## 2020-02-06 ENCOUNTER — Encounter: Payer: 59 | Attending: Physical Medicine & Rehabilitation | Admitting: Physical Medicine & Rehabilitation

## 2020-02-06 ENCOUNTER — Other Ambulatory Visit: Payer: Self-pay

## 2020-02-06 ENCOUNTER — Encounter: Payer: Self-pay | Admitting: Physical Medicine & Rehabilitation

## 2020-02-06 VITALS — BP 112/77 | HR 79 | Temp 97.5°F | Ht 59.0 in | Wt 165.0 lb

## 2020-02-06 DIAGNOSIS — G479 Sleep disorder, unspecified: Secondary | ICD-10-CM | POA: Diagnosis not present

## 2020-02-06 DIAGNOSIS — I69959 Hemiplegia and hemiparesis following unspecified cerebrovascular disease affecting unspecified side: Secondary | ICD-10-CM

## 2020-02-06 DIAGNOSIS — R269 Unspecified abnormalities of gait and mobility: Secondary | ICD-10-CM

## 2020-02-06 DIAGNOSIS — I69398 Other sequelae of cerebral infarction: Secondary | ICD-10-CM | POA: Diagnosis not present

## 2020-02-06 DIAGNOSIS — M65311 Trigger thumb, right thumb: Secondary | ICD-10-CM | POA: Insufficient documentation

## 2020-02-06 DIAGNOSIS — I69154 Hemiplegia and hemiparesis following nontraumatic intracerebral hemorrhage affecting left non-dominant side: Secondary | ICD-10-CM | POA: Diagnosis not present

## 2020-02-06 MED ORDER — BACLOFEN 20 MG PO TABS: 20.0000 mg | ORAL_TABLET | Freq: Three times a day (TID) | ORAL | 1 refills | Status: DC

## 2020-02-06 NOTE — Progress Notes (Addendum)
Subjective:    Patient ID: Abigail Campos, female    DOB: 06/18/1971, 49 y.o.   MRN: [id number]  HPI:  Right-handed female with history of hypertension, migraine headaches presents for follow up after right basal ganglia hemorrhage.   Last clinic visit 12/26/2019.  She had Dysport injection at that time. Since that time, pt states she did well with the injection. She states she does not feel her Baclofen is working.  Denies falls. She is waiting to go back to therapies. Her thumb has improved. Her sleep has not improved.   Pain Inventory Average Pain 4 Pain Right Now 4 My pain is aching  In the last 24 hours, has pain interfered with the following? General activity 4 Relation with others 1 Enjoyment of life 2 What TIME of day is your pain at its worst? night Sleep (in general) Poor  Pain is worse with: walking and standing Pain improves with: rest Relief from Meds: 0  Mobility walk with assistance use a cane how many minutes can you walk? 10 ability to climb steps?  yes do you drive?  no Do you have any goals in this area?  yes  Function disabled: date disabled . Do you have any goals in this area?  no  Neuro/Psych trouble walking spasms confusion  Prior Studies Any changes since last visit?  no  Physicians involved in your care Any changes since last visit?  no   Family History  Problem Relation Age of Onset  . Diabetes Mother   . Hypertension Mother   . Glaucoma Mother   . Colon cancer Mother        mother age 6  . Rectal cancer Father        dx 35  . Stroke Paternal Grandfather   . Stroke Maternal Grandfather   . Diabetes Brother   . Hypertension Brother   . Heart disease Maternal Aunt        x 2 did 2012 CAD-CHF  . Diabetes Brother   . Hypertension Brother   . Lung cancer Maternal Aunt   . Breast cancer Neg Hx   . Esophageal cancer Neg Hx   . Stomach cancer Neg Hx    Social History   Socioeconomic History  . Marital status: Married      Spouse name: Not on file  . Number of children: 2  . Years of education: Not on file  . Highest education level: Not on file  Occupational History  . Occupation: disable d/t strokeTEACHER    Employer: [employer]  Tobacco Use  . Smoking status: Never Smoker  . Smokeless tobacco: Never Used  Substance and Sexual Activity  . Alcohol use: No  . Drug use: No  . Sexual activity: Not Currently    Birth control/protection: Surgical  Other Topics Concern  . Not on file  Social History Narrative   Used to live independently, stroke 03-2016, d/c from rehab to his mother's house 04-2016, then moved back w/ her husband as off 04/2018   Daughter lives in [place],   [place] lives w/ pt   Social Determinants of Health   Financial Resource Strain:   . Difficulty of Paying Living Expenses: Not on file  Food Insecurity:   . Worried About Charity fundraiser in the Last Year: Not on file  . Ran Out of Food in the Last Year: Not on file  Transportation Needs:   . Lack of Transportation (Medical): Not on file  .  Lack of Transportation (Non-Medical): Not on file  Physical Activity:   . Days of Exercise per Week: Not on file  . Minutes of Exercise per Session: Not on file  Stress:   . Feeling of Stress : Not on file  Social Connections:   . Frequency of Communication with Friends and Family: Not on file  . Frequency of Social Gatherings with Friends and Family: Not on file  . Attends Religious Services: Not on file  . Active Member of Clubs or Organizations: Not on file  . Attends Archivist Meetings: Not on file  . Marital Status: Not on file   Past Surgical History:  Procedure Laterality Date  . ABDOMINAL HYSTERECTOMY  11-14-07   no oophorectomy per surgical report  . CESAREAN SECTION     [id number]  . INGUINAL HERNIA REPAIR     2004   Past Medical History:  Diagnosis Date  . Anxiety and depression   . Essential hypertension 10/19/2007   03-2010: metoprolol changed to  bystolic (was not feeling well on it, no specific allergy or reaction)    . Hemiplegia ([place])   . Hyperlipemia   . Hypertension   . Migraine   . Neuromuscular disorder ([place])    left sided hemiplegia - has brace for l leg and walks with a cane  . Ovarian cancer ([place])   . Sleep apnea    not wearing c-pap yet  . Stroke ([place]) 2017   BP 112/77   Pulse 79   Temp (!) 97.5 F (36.4 C)   Ht 4\' 11"  (1.499 m)   Wt 165 lb (74.8 kg)   SpO2 96%   BMI 33.33 kg/m   Opioid Risk Score:   Fall Risk Score:  `1  Depression screen PHQ 2/9  Depression screen [facility] 2/9 07/12/2019 05/04/2019 04/06/2019 08/05/2018 01/28/2018 08/13/2016  Decreased Interest 2 1 1  0 0 0  Down, Depressed, Hopeless 2 1 1  0 0 0  PHQ - 2 Score 4 2 2  0 0 0  Altered sleeping 3 - - - - -  Tired, decreased energy 3 - - - - -  Change in appetite 3 - - - - -  Feeling bad or failure about yourself  3 - - - - -  Trouble concentrating 2 - - - - -  Moving slowly or fidgety/restless 2 - - - - -  Suicidal thoughts 0 - - - - -  PHQ-9 Score 20 - - - - -  Some recent data might be hidden   Review of Systems  Musk: Gait abnormality Neurological: Positive for  Weakness, arthralgia.  All other systems reviewed and are negative.     Objective:     Physical Exam Constitutional: No distress .  Musc: No edema in extremities.  No tenderness in extremities. Gait: Hemiplegic Neurological: She is Alert Motor:  LUE: Shoulder abduction 2-/5, elbow flex 0/5, elbow ext 0/5, hand grip 0/5.  LLE: Hip flexion 4-/5, knee extension 4/5, ADF/PF 1/5 Modified [place]: Left 1/4 elbow flexors, wrist flexors 1+/5, 2/4 left finger flexors, 2/4 ankle plantar flexors.     Assessment & Plan:  Right-handed female with history of hypertension, migraine headaches presents for follow up after right basal ganglia hemorrhage.    1. 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 affecting left nondominant side  Cont meds  Cont Follow up  Neurology  Spastic hemiplegia affecting left non-dominant side  Have had lengthy discussion with patient regarding treatment options  for spasticity  Will increase baclofen 20mg  TID   Cont Dysport injection, pt would like to continue at decreased dose:    Left Med biceps: Cont 100 units   Left FCR: Cont 100 units   Left FCU: Cont 100 units   Left FDS: increase to 400 units   Left FDP: increase to 400 units    Left Med gastroc: Cont 150    Left Lat gastroc: Cont 150   Cont HEP  Cont WHO   Patient would like information of group therapy  2. Gait abnormality  Cont quad cane  Cont Therapies, attempting to get restarted  3. Right trigger thumb  Improved with no locking  Overuse injury as well with positioning  Figure 8 splint, not using at present  Pt to consider surgery referral in future if necessary  Good benefit with injection on 10/29  Hold off on further injection at present  4. Sleep disturbance  Following up with sleep specialist, continues to be poor

## 2020-02-13 ENCOUNTER — Other Ambulatory Visit: Payer: Self-pay

## 2020-02-14 ENCOUNTER — Other Ambulatory Visit: Payer: Self-pay

## 2020-02-14 ENCOUNTER — Ambulatory Visit (INDEPENDENT_AMBULATORY_CARE_PROVIDER_SITE_OTHER): Payer: 59 | Admitting: Internal Medicine

## 2020-02-14 ENCOUNTER — Encounter: Payer: Self-pay | Admitting: Internal Medicine

## 2020-02-14 VITALS — BP 106/72 | HR 84 | Temp 97.5°F | Resp 18 | Ht 59.0 in | Wt 167.4 lb

## 2020-02-14 DIAGNOSIS — I10 Essential (primary) hypertension: Secondary | ICD-10-CM | POA: Diagnosis not present

## 2020-02-14 DIAGNOSIS — G4733 Obstructive sleep apnea (adult) (pediatric): Secondary | ICD-10-CM

## 2020-02-14 DIAGNOSIS — I6930 Unspecified sequelae of cerebral infarction: Secondary | ICD-10-CM

## 2020-02-14 DIAGNOSIS — R739 Hyperglycemia, unspecified: Secondary | ICD-10-CM | POA: Diagnosis not present

## 2020-02-14 DIAGNOSIS — E785 Hyperlipidemia, unspecified: Secondary | ICD-10-CM

## 2020-02-14 LAB — HEMOGLOBIN A1C: Hgb A1c MFr Bld: 5.7 % (ref 4.6–6.5)

## 2020-02-14 LAB — LIPID PANEL
Cholesterol: 113 mg/dL (ref 0–200)
HDL: 38.2 mg/dL — ABNORMAL LOW (ref >39.00)
LDL Cholesterol: 66 mg/dL (ref 0–99)
NonHDL: 74.83
Total CHOL/HDL Ratio: 3
Triglycerides: 46 mg/dL (ref 0.0–149.0)
VLDL: 9.2 mg/dL (ref 0.0–40.0)

## 2020-02-14 MED ORDER — TRAZODONE HCL 100 MG PO TABS: 100.0000 mg | ORAL_TABLET | Freq: Every evening | ORAL | 1 refills | Status: DC | PRN

## 2020-02-14 NOTE — Progress Notes (Signed)
Pre visit review using our clinic review tool, if applicable. No additional management support is needed unless otherwise documented below in the visit note. 

## 2020-02-14 NOTE — Progress Notes (Signed)
Subjective:    Patient ID: Abigail Campos, female    DOB: September 23, 1971, 49 y.o.   MRN: [id number]  DOS:  02/14/2020 Type of visit - description: f/u Today we talk about insomnia, high cholesterol and hyperglycemia. Also about sleep apnea. Note from physical medicine reviewed.   Review of Systems In general feels well. Emotionally doing okay, lives at home with her husband and 1 child. Denies problems.  Past Medical History:  Diagnosis Date  . Anxiety and depression   . Essential hypertension 10/19/2007   03-2010: metoprolol changed to bystolic (was not feeling well on it, no specific allergy or reaction)    . Hemiplegia ([place])   . Hyperlipemia   . Hypertension   . Migraine   . Neuromuscular disorder ([place])    left sided hemiplegia - has brace for l leg and walks with a cane  . Ovarian cancer ([place])   . Sleep apnea    not wearing c-pap yet  . Stroke [facility]) 2017    Past Surgical History:  Procedure Laterality Date  . ABDOMINAL HYSTERECTOMY  11-14-07   no oophorectomy per surgical report  . CESAREAN SECTION     [id number]  . INGUINAL HERNIA REPAIR     2004    Allergies as of 02/14/2020      Reactions   Bee Venom Anaphylaxis   Peanut-containing Drug Products Anaphylaxis   Hydrocodone Hives   Generalize itching w/o rash   Isovue [iopamidol] Hives, Itching   Pt broke out with one facial hive.  Had itching on her right upper ant and posterior arm w/o evidence of hives.  Pt given water and 25mg  benadryl po.  We observed pt for 30 minutes before d/c.  J. Bohm     Ambien [zolpidem Tartrate] Other (See Comments)   forgetfulness   Aspirin Swelling   Reports blisters w/ ASA but pt reports is ok w/ Motrin, Advil, naproxen   Latex Hives      Medication List       Accurate as of February 14, 2020 11:59 PM. If you have any questions, ask your nurse or doctor.        STOP taking these medications   diphenhydrAMINE 25 MG tablet Commonly known as: Benadryl Allergy Stopped by: Kathlene November, MD   linaclotide 145 MCG Caps capsule Commonly known as: Linzess Stopped by: Kathlene November, MD   predniSONE 50 MG tablet Commonly known as: DELTASONE Stopped by: Kathlene November, MD   zaleplon 10 MG capsule Commonly known as: SONATA Stopped by: Kathlene November, MD     TAKE these medications   atorvastatin 20 MG tablet Commonly known as: LIPITOR Take 1 tablet (20 mg total) by mouth daily at 6 PM.   baclofen 20 MG tablet Commonly known as: LIORESAL Take 1 tablet (20 mg total) by mouth 3 (three) times daily.   buPROPion 100 MG tablet Commonly known as: WELLBUTRIN Take 1 tablet (100 mg total) by mouth 2 (two) times daily.   Bystolic 5 MG tablet Generic drug: nebivolol TAKE 1 TABLET BY MOUTH EVERY 12 HOURS   clopidogrel 75 MG tablet Commonly known as: PLAVIX Take 1 tablet (75 mg total) by mouth daily.   escitalopram 20 MG tablet Commonly known as: LEXAPRO Take 1 tablet (20 mg total) by mouth daily.   gabapentin 300 MG capsule Commonly known as: NEURONTIN Take 1 capsule (300 mg total) by mouth 2 (two) times daily.   lisinopril 2.5 MG tablet Commonly known as: ZESTRIL Take  1 tablet (2.5 mg total) by mouth daily.   traZODone 100 MG tablet Commonly known as: DESYREL Take 1 tablet (100 mg total) by mouth at bedtime as needed for sleep. Started by: Kathlene November, MD         Objective:   Physical Exam BP 106/72 (BP Location: Right Arm, Patient Position: Sitting, Cuff Size: Normal)   Pulse 84   Temp (!) 97.5 F (36.4 C) (Temporal)   Resp 18   Ht 4\' 11"  (1.499 m)   Wt 167 lb 6 oz (75.9 kg)   SpO2 97%   BMI 33.81 kg/m  General:   Well developed, NAD, BMI noted. HEENT:  Normocephalic . Face symmetric, atraumatic Lungs:  CTA B Normal respiratory effort, no intercostal retractions, no accessory muscle use. Heart: RRR,  no murmur.  Lower extremities: no pretibial edema bilaterally  Skin: Not pale. Not jaundice Neurologic:  alert & oriented X3. Neurological exam consistent  with left-sided hemiplegia, uses a quad cane. Seems  steady Psych--  Cognition and judgment appear intact.  Cooperative with normal attention span and concentration.  Behavior appropriate. No anxious or depressed appearing.      Assessment      Assessment   Prediabetes: A1c 6.0 (April 2017)  HTN Hyperlipidemia Depression, insomnia GERD Stroke 03-2016: Sequela (L)  spastic hemiplegia   ICH, right basilar ganglia due to HTN emergency, + encephalopathy, + dysphagia, aphasia, dysarthria Residual L spasticity.  CTA neck 03-2016 neg CTA head 10-17 (-) aneurysm Headaches , migraines dx after 2nd pregnancy, (-) CT head 2014 and 2015 Insomnia   Cough, persisting, saw Dr. Melvyn Novas 11-2015 OSA   PLAN: Prediabetes: Checking A1c, diet ok, unable to exercise much HTN: Recent BMP satisfactory, BP today is very good, continue lisinopril, Bystolic. Hyperlipidemia: On Lipitor, recent LFTs normal, check a FLP. Depression insomnia: Doing great. See last visit, she was taking zolpidem but was not getting enough of a supply so she started to use her old trazodone and at this point is working well for her. DC zolpidem, RF trazodone. Stroke sequela: Last visit with physical medicine 02/06/2020, multiple issues addressed including sleep disturbance OSA: Working with his dentist to get a appliance. Not on CPAP Preventive care: Recommend to proceed with a Covid vaccination when available RTC 4 months CPX   This visit occurred during the SARS-CoV-2 public health emergency.  Safety protocols were in place, including screening questions prior to the visit, additional usage of staff PPE, and extensive cleaning of exam room while observing appropriate contact time as indicated for disinfecting solutions.

## 2020-02-14 NOTE — Patient Instructions (Addendum)
GO TO THE LAB : Get the blood work     GO TO [place] back for a physical exam in 4 months, please make an appointment   I sent trazodone 100 mg to help you sleep, you can take either half or 1 tablet at night

## 2020-02-15 ENCOUNTER — Telehealth: Payer: Self-pay

## 2020-02-15 ENCOUNTER — Other Ambulatory Visit: Payer: Self-pay | Admitting: Internal Medicine

## 2020-02-15 NOTE — Telephone Encounter (Signed)
PA initiated via Covermymeds; KEY: [id number]. Awaiting determination.

## 2020-02-15 NOTE — Telephone Encounter (Signed)
PA denied. Will appeal. Appeal information faxed to [place] at [phone]. Awaiting determination.

## 2020-02-15 NOTE — Assessment & Plan Note (Signed)
Prediabetes: Checking A1c, diet ok, unable to exercise much HTN: Recent BMP satisfactory, BP today is very good, continue lisinopril, Bystolic. Hyperlipidemia: On Lipitor, recent LFTs normal, check a FLP. Depression insomnia: Doing great. See last visit, she was taking zolpidem but was not getting enough of a supply so she started to use her old trazodone and at this point is working well for her. DC zolpidem, RF trazodone. Stroke sequela: Last visit with physical medicine 02/06/2020, multiple issues addressed including sleep disturbance OSA: Working with his dentist to get a appliance. Not on CPAP Preventive care: Recommend to proceed with a Covid vaccination when available RTC 4 months CPX

## 2020-02-16 NOTE — Telephone Encounter (Signed)
Appeal approved. Effective 02/16/2020 to 02/15/2021.

## 2020-02-16 NOTE — Telephone Encounter (Signed)
Spoke with Dr. Heber Ferry about the Bystolic PA. The patient has a history of hypertension, had a stroke due to elevated BP. She is currently on Bystolic, doing well, no side effects. I do not see any clinical reason to change a medication that is working, he agrees with me.

## 2020-02-19 ENCOUNTER — Other Ambulatory Visit: Payer: Self-pay | Admitting: Internal Medicine

## 2020-02-22 ENCOUNTER — Encounter: Payer: Self-pay | Admitting: Medical

## 2020-02-27 ENCOUNTER — Encounter: Payer: Self-pay | Admitting: Internal Medicine

## 2020-02-28 ENCOUNTER — Telehealth: Payer: Self-pay

## 2020-02-28 ENCOUNTER — Ambulatory Visit (INDEPENDENT_AMBULATORY_CARE_PROVIDER_SITE_OTHER): Payer: 59 | Admitting: Internal Medicine

## 2020-02-28 ENCOUNTER — Encounter: Payer: Self-pay | Admitting: Internal Medicine

## 2020-02-28 ENCOUNTER — Other Ambulatory Visit: Payer: Self-pay

## 2020-02-28 VITALS — BP 106/74 | HR 88 | Temp 96.5°F | Resp 18 | Ht 59.0 in | Wt 166.5 lb

## 2020-02-28 DIAGNOSIS — T148XXA Other injury of unspecified body region, initial encounter: Secondary | ICD-10-CM | POA: Diagnosis not present

## 2020-02-28 NOTE — Progress Notes (Signed)
Subjective:    Patient ID: Abigail Campos, female    DOB: 02-Feb-1971, 49 y.o.   MRN: [id number]  DOS:  02/28/2020 Type of visit - description: Acute 4 days ago, the patient noted excoriation at the left pretibial area and wonders if it is a pressure ulcer because she wears a leg brace. Denies redness, discharge. No fever chills.   Review of Systems See above   Past Medical History:  Diagnosis Date  . Anxiety and depression   . Essential hypertension 10/19/2007   03-2010: metoprolol changed to bystolic (was not feeling well on it, no specific allergy or reaction)    . Hemiplegia ([place])   . Hyperlipemia   . Hypertension   . Migraine   . Neuromuscular disorder ([place])    left sided hemiplegia - has brace for l leg and walks with a cane  . Ovarian cancer ([place])   . Sleep apnea    not wearing c-pap yet  . Stroke [facility]) 2017    Past Surgical History:  Procedure Laterality Date  . ABDOMINAL HYSTERECTOMY  11-14-07   no oophorectomy per surgical report  . CESAREAN SECTION     [id number]  . INGUINAL HERNIA REPAIR     2004    Allergies as of 02/28/2020      Reactions   Bee Venom Anaphylaxis   Peanut-containing Drug Products Anaphylaxis   Hydrocodone Hives   Generalize itching w/o rash   Isovue [iopamidol] Hives, Itching   Pt broke out with one facial hive.  Had itching on her right upper ant and posterior arm w/o evidence of hives.  Pt given water and 25mg  benadryl po.  We observed pt for 30 minutes before d/c.  J. Bohm     Ambien [zolpidem Tartrate] Other (See Comments)   forgetfulness   Aspirin Swelling   Reports blisters w/ ASA but pt reports is ok w/ Motrin, Advil, naproxen   Latex Hives      Medication List       Accurate as of February 28, 2020 11:59 PM. If you have any questions, ask your nurse or doctor.        atorvastatin 20 MG tablet Commonly known as: LIPITOR Take 1 tablet (20 mg total) by mouth daily at 6 PM.   baclofen 20 MG tablet Commonly known as:  LIORESAL Take 1 tablet (20 mg total) by mouth 3 (three) times daily.   buPROPion 100 MG tablet Commonly known as: WELLBUTRIN Take 1 tablet (100 mg total) by mouth 2 (two) times daily.   Bystolic 5 MG tablet Generic drug: nebivolol TAKE 1 TABLET BY MOUTH EVERY 12 HOURS   clopidogrel 75 MG tablet Commonly known as: PLAVIX Take 1 tablet (75 mg total) by mouth daily.   escitalopram 20 MG tablet Commonly known as: LEXAPRO Take 1 tablet (20 mg total) by mouth daily.   gabapentin 300 MG capsule Commonly known as: NEURONTIN Take 1 capsule (300 mg total) by mouth 2 (two) times daily.   lisinopril 2.5 MG tablet Commonly known as: ZESTRIL Take 1 tablet (2.5 mg total) by mouth daily.   traZODone 100 MG tablet Commonly known as: DESYREL Take 1 tablet (100 mg total) by mouth at bedtime as needed for sleep.          Objective:   Physical Exam BP 106/74 (BP Location: Right Arm, Patient Position: Sitting, Cuff Size: Normal)   Pulse 88   Temp (!) 96.5 F (35.8 C) (Temporal)   Resp 18  Ht 4\' 11"  (1.499 m)   Wt 166 lb 8 oz (75.5 kg)   SpO2 100%   BMI 33.63 kg/m  General:   Well developed, NAD, BMI noted. HEENT:  Normocephalic . Face symmetric, atraumatic Lower extremities: no pretibial edema bilaterally.  Does have skin lesion at the left pretibial area along with some hyperpigmentation. Skin: Not pale. Not jaundice Neurologic:  alert & oriented X3.  Speech normal, gait assisted with previous stroke Psych--  Cognition and judgment appear intact.  Cooperative with normal attention span and concentration.  Behavior appropriate. No anxious or depressed appearing.          Assessment      Assessment   Prediabetes: A1c 6.0 (April 2017)  HTN Hyperlipidemia Depression, insomnia GERD Stroke 03-2016: Sequela (L)  spastic hemiplegia   ICH, right basilar ganglia due to HTN emergency, + encephalopathy, + dysphagia, aphasia, dysarthria Residual L spasticity.  CTA neck  03-2016 neg CTA head 10-17 (-) aneurysm Headaches , migraines dx after 2nd pregnancy, (-) CT head 2014 and 2015 Insomnia   Cough, persisting, saw Dr. Melvyn Novas 11-2015 OSA   PLAN: Excoriation, left pretibial area: As the patient suspected, skin opening could be pressure ulcer from using a leg brace or simply a excoriation.  Recommend to protect the area with an antibiotic ointment and 2 Band-Aids during the daytime and leave it alone at nighttime. Call if not getting better. She is concerned about the color of the skin, is likely post irritation hyperpigmentation.    This visit occurred during the SARS-CoV-2 public health emergency.  Safety protocols were in place, including screening questions prior to the visit, additional usage of staff PPE, and extensive cleaning of exam room while observing appropriate contact time as indicated for disinfecting solutions.

## 2020-02-28 NOTE — Telephone Encounter (Signed)
After hours call- Pt tried calling to make an appt.   See mychart messages from yesterday- per Dr. Larose Kells- needs in person visit please.

## 2020-02-28 NOTE — Progress Notes (Signed)
Pre visit review using our clinic review tool, if applicable. No additional management support is needed unless otherwise documented below in the visit note. 

## 2020-02-28 NOTE — Patient Instructions (Signed)
Cover the area with an antibiotic ointment on 1 or 2 Band-Aids when you were your leg brace.  Leave it open at nighttime  Call if not gradually better

## 2020-02-28 NOTE — Telephone Encounter (Signed)
Done

## 2020-02-29 ENCOUNTER — Other Ambulatory Visit: Payer: Self-pay | Admitting: Physical Medicine & Rehabilitation

## 2020-02-29 NOTE — Assessment & Plan Note (Signed)
Excoriation, left pretibial area: As the patient suspected, skin opening could be pressure ulcer from using a leg brace or simply a excoriation.  Recommend to protect the area with an antibiotic ointment and 2 Band-Aids during the daytime and leave it alone at nighttime. Call if not getting better. She is concerned about the color of the skin, is likely post irritation hyperpigmentation.

## 2020-03-01 ENCOUNTER — Encounter: Payer: Self-pay | Admitting: Internal Medicine

## 2020-03-01 ENCOUNTER — Other Ambulatory Visit: Payer: Self-pay

## 2020-03-01 ENCOUNTER — Ambulatory Visit (INDEPENDENT_AMBULATORY_CARE_PROVIDER_SITE_OTHER): Payer: 59 | Admitting: Internal Medicine

## 2020-03-01 ENCOUNTER — Ambulatory Visit (INDEPENDENT_AMBULATORY_CARE_PROVIDER_SITE_OTHER): Payer: 59

## 2020-03-01 VITALS — BP 106/62 | HR 78 | Temp 97.9°F | Ht 59.0 in | Wt 151.0 lb

## 2020-03-01 DIAGNOSIS — J90 Pleural effusion, not elsewhere classified: Secondary | ICD-10-CM

## 2020-03-01 DIAGNOSIS — G4733 Obstructive sleep apnea (adult) (pediatric): Secondary | ICD-10-CM

## 2020-03-01 IMAGING — DX DG CHEST 2V
2 series · 2 of 2 positions shown · non-contrast
Comparison: Chest radiograph [DATE]; abdominal MRI
[DATE].

CLINICAL DATA: Recent pleural effusion

EXAM:
CHEST - 2 VIEW

[chest lat]
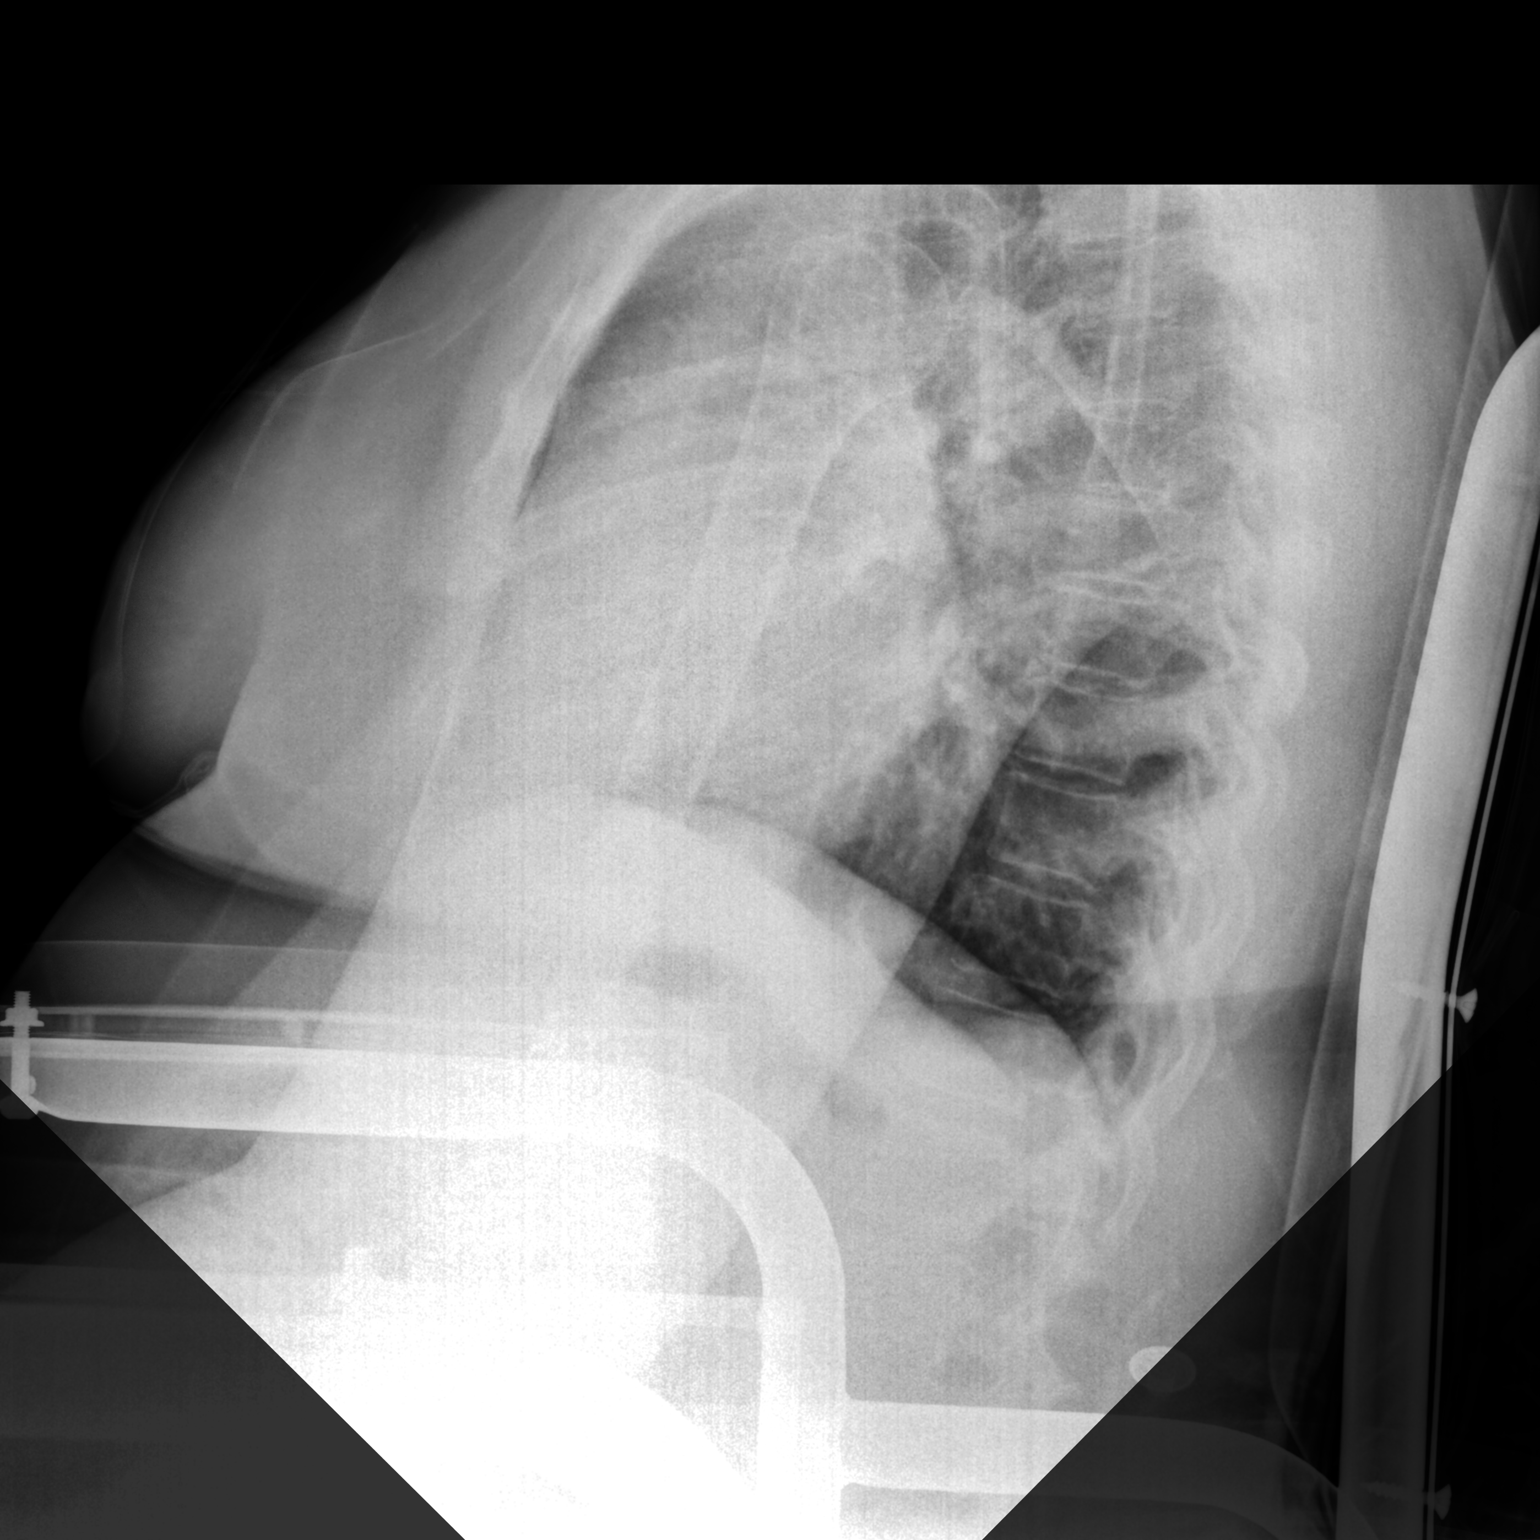

[chest ap]
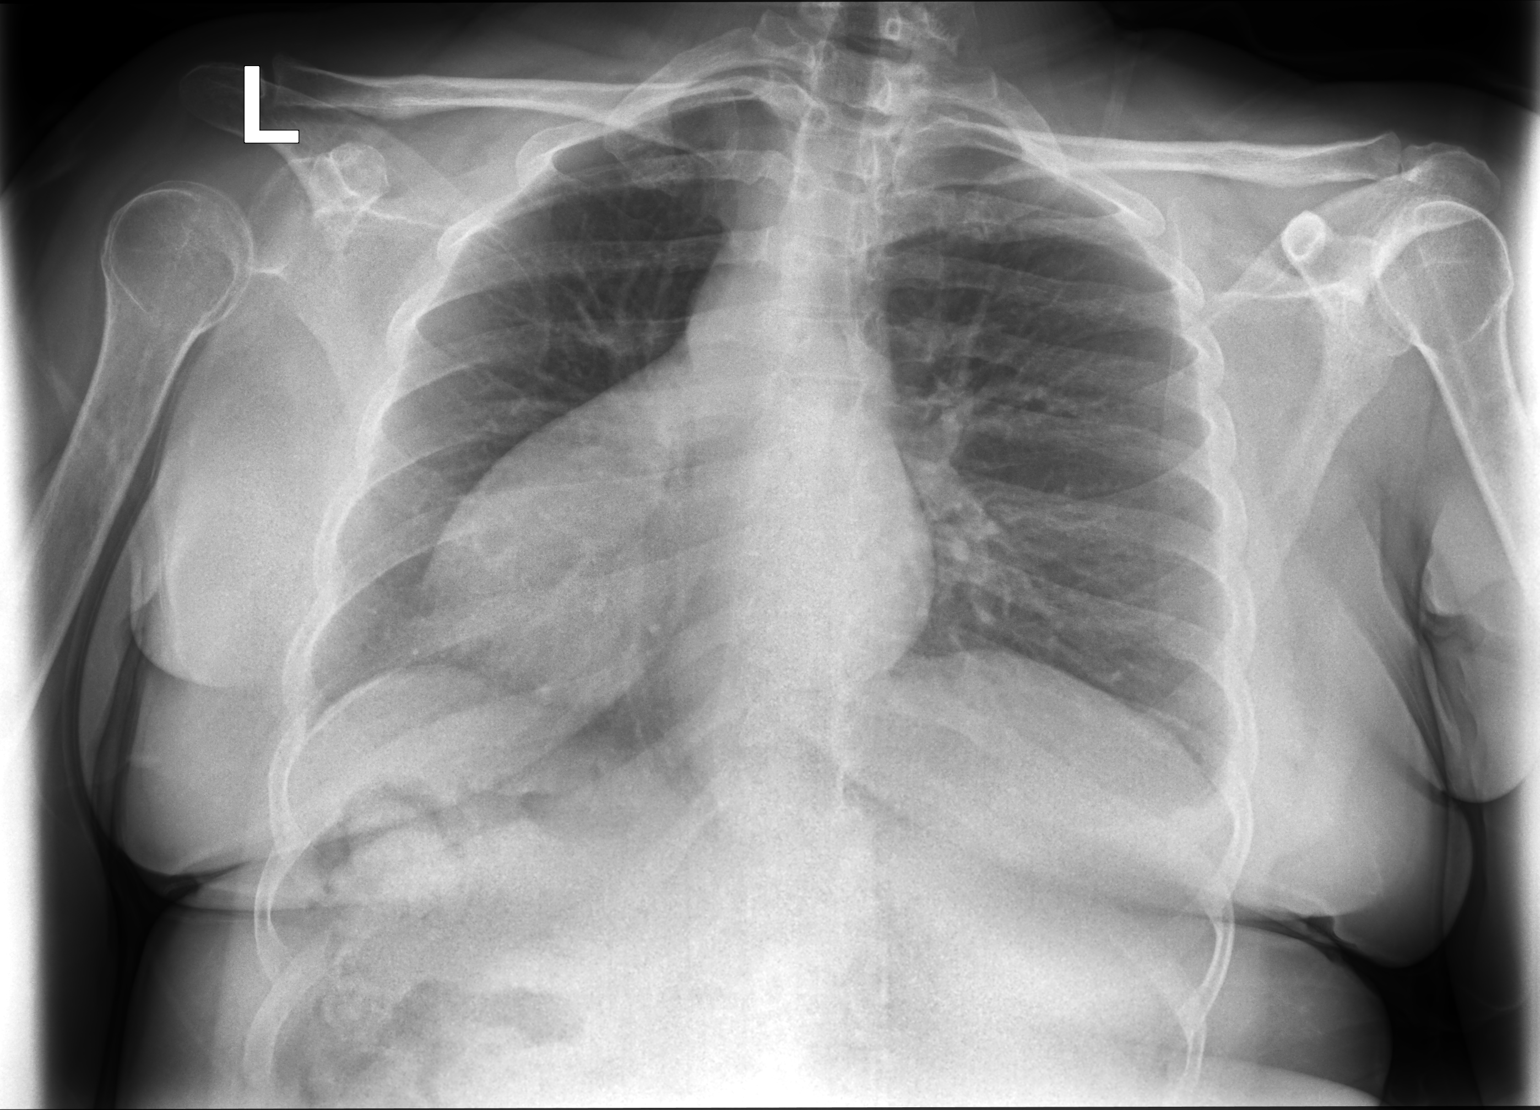

[2 of 2 positions shown; findings below may reference images not displayed]

FINDINGS: Lungs are clear. No appreciable pleural effusions. Heart size and
pulmonary vascularity are normal. No adenopathy. No bone lesions.
IMPRESSION: Lungs clear. No evident pleural effusion. Cardiac silhouette within
normal limits.

## 2020-03-01 NOTE — Assessment & Plan Note (Signed)
Unexplained and not obvious on exam today Plan- CXR

## 2020-03-01 NOTE — Patient Instructions (Signed)
Order- CXR    Dx pleural effusion on MRI  I hope the dental work goes smoothly so you can get your oral appliance for sleep apnea.  Please call if we can help

## 2020-03-01 NOTE — Assessment & Plan Note (Signed)
We were concerned inability to use left hand would make CPAP headgear difficult. Plan- proceed with dental work to facilitate oral appliance

## 2020-03-01 NOTE — Progress Notes (Signed)
HPI Abigail Campos  never smoker followed for OSA, Insomnia, complicated by Migraine, HBP, CVA 2017 (Basal Ganglia ICH)/ aphasia/ dysphagia/ spastic L hemiparesis, Upper Airway Cough, Depression, GERD HST 08/28/2019- AHI 12.3/ hr, desaturation to 75%, body weight 168 lbs  --------------------------------------------------------------------------------------------------    Abigail Campos never smoker followed for OSA, Insomnia, complicated by Migraine, HBP, CVA 2017 (Basal Ganglia ICH)/ aphasia/ dysphagia/ spastic L hemiplegia, Upper Airway Cough, Depression, GERD HST 08/28/2019- AHI 12.3/ hr, desaturation to 75%, body weight 168 lbs Meds include neurontin, trazodone 100, sonta 5 mg,  Body weight today 164 lbs Reviewed her sleep study and treatment options. She finds the oral appliance more attractive and she has mild OSA. She has family with CPAP and understands that can be a back up option. With non-functional L hand, manipulating CPAP mask may be difficult.  Had flu vax.   Abigail Campos never smoker followed for OSA, Insomnia, complicated by Migraine, HBP, CVA 2017 (Basal Ganglia ICH)/ aphasia/ dysphagia/ spastic L hemiplegia, Upper Airway Cough, Depression, GERD -----Abigail Campos/u OSA . Body weight today 151 lbs Oral Appliance- Ron Parker-  MRI abd 12/22/19 showed trace dependent pleural effusions.  Getting dental work so that she can proceed with fitted oral appliance for sleep apnea, per Dr Ron Parker. She is comfortable with this approach.   ROS-see HPI   + = positive Constitutional:    weight loss, night sweats, fevers, chills, fatigue, lassitude. HEENT:    +headaches, difficulty swallowing, tooth/dental problems, sore throat,       sneezing, itching, ear ache, nasal congestion, post nasal drip, snoring CV:    chest pain, orthopnea, PND, swelling in lower extremities, anasarca,                                  dizziness, palpitations Resp:   shortness of breath with exertion or at rest.                productive  cough,   non-productive cough, coughing up of blood.              change in color of mucus.  wheezing.   Skin:    rash or lesions. GI:  No-   heartburn, indigestion, abdominal pain, nausea, vomiting, diarrhea,                 change in bowel habits, loss of appetite GU: dysuria, change in color of urine, no urgency or frequency.   flank pain. MS:   joint pain, stiffness, decreased range of motion, back pain. Neuro-   +L spastic hemiparesis Psych:  change in mood or affect.  depression or anxiety.   memory loss.  OBJ- Physical Exam General- Alert, Oriented, Affect-appropriate, Distress- none acute, +Overweight Skin- rash-none, lesions- none, excoriation- none Lymphadenopathy- none Head- atraumatic            Eyes- Gross vision intact, PERRLA, conjunctivae and secretions clear            Ears- Hearing, canals-normal            Nose- Clear, no-Septal dev, mucus, polyps, erosion, perforation             Throat- Mallampati II-III , mucosa clear , drainage- none, tonsils small, + teeth Neck- flexible , trachea midline, no stridor , thyroid nl, carotid no bruit Chest - symmetrical excursion , unlabored           Heart/CV- RRR , no murmur ,  no gallop  , no rub, nl s1 s2                           - JVD- none , edema- none, stasis changes- none, varices- none           Lung- clear to P&A, wheeze- none, cough- none , dullness-none, rub- none           Chest wall-  Abd-  Br/ Gen/ Rectal- Not done, not indicated Extrem- cyanosis- none, clubbing, none, atrophy- none,+splint L calf, + cane Neuro- L spastic hemiplegic.

## 2020-03-06 NOTE — Progress Notes (Signed)
Patient identification verified by phone. Results from chest x ray provided. Per Dr. Annamaria Boots, x ray is clear, heart and lungs look normal with no pleural effusion, problem has resolved. Patient verbalized understanding of results and was pleased.

## 2020-03-09 ENCOUNTER — Other Ambulatory Visit: Payer: Self-pay | Admitting: Internal Medicine

## 2020-03-11 ENCOUNTER — Encounter: Payer: Self-pay | Admitting: Internal Medicine

## 2020-03-13 ENCOUNTER — Encounter: Payer: Self-pay | Admitting: Internal Medicine

## 2020-03-18 ENCOUNTER — Other Ambulatory Visit: Payer: Self-pay

## 2020-03-18 DIAGNOSIS — N951 Menopausal and female climacteric states: Secondary | ICD-10-CM

## 2020-03-18 MED ORDER — GABAPENTIN 300 MG PO CAPS: 300.0000 mg | ORAL_CAPSULE | Freq: Two times a day (BID) | ORAL | 2 refills | Status: DC

## 2020-03-25 ENCOUNTER — Encounter: Payer: Self-pay | Admitting: Internal Medicine

## 2020-03-25 ENCOUNTER — Other Ambulatory Visit: Payer: Self-pay | Admitting: Physical Medicine & Rehabilitation

## 2020-03-26 ENCOUNTER — Telehealth: Payer: Self-pay | Admitting: Internal Medicine

## 2020-03-26 NOTE — Telephone Encounter (Signed)
See patient message, reports left leg swelling, wonders about restart Lasix. Please arrange a virtual visit for today

## 2020-03-26 NOTE — Telephone Encounter (Signed)
LMOM informing Pt to return call to schedule appt at her convenience.

## 2020-03-28 ENCOUNTER — Encounter: Payer: 59 | Attending: Physical Medicine & Rehabilitation | Admitting: Physical Medicine & Rehabilitation

## 2020-03-28 ENCOUNTER — Encounter: Payer: Self-pay | Admitting: Physical Medicine & Rehabilitation

## 2020-03-28 ENCOUNTER — Other Ambulatory Visit: Payer: Self-pay

## 2020-03-28 VITALS — BP 160/102 | HR 69 | Temp 98.9°F | Ht 59.0 in | Wt 169.6 lb

## 2020-03-28 DIAGNOSIS — I69154 Hemiplegia and hemiparesis following nontraumatic intracerebral hemorrhage affecting left non-dominant side: Secondary | ICD-10-CM | POA: Diagnosis not present

## 2020-03-28 DIAGNOSIS — I69959 Hemiplegia and hemiparesis following unspecified cerebrovascular disease affecting unspecified side: Secondary | ICD-10-CM | POA: Insufficient documentation

## 2020-03-28 DIAGNOSIS — M65311 Trigger thumb, right thumb: Secondary | ICD-10-CM | POA: Diagnosis not present

## 2020-03-28 DIAGNOSIS — G8114 Spastic hemiplegia affecting left nondominant side: Secondary | ICD-10-CM

## 2020-03-28 DIAGNOSIS — I619 Nontraumatic intracerebral hemorrhage, unspecified: Secondary | ICD-10-CM

## 2020-03-28 NOTE — Progress Notes (Signed)
Dysport: Procedure Note Patient Name: Abigail Campos DOB: 11/29/1971 MRN: [id number] Date: 03/28/2020  Procedure: Botulinum toxin administration Guidance: EMG Diagnosis: Spastic left nondominant IPH Attending: Delice Lesch, MD   Informed consent: Risks, benefits & options of the procedure are explained to the patient (and/or family). The patient elects to proceed with procedure. Risks include but are not limited to weakness, respiratory distress, dry mouth, ptosis, antibody formation, worsening of some areas of function. Benefits include decreased abnormal muscle tone, improved hygiene and positioning, decreased skin breakdown and, in some cases, decreased pain. Options include conservative management with oral antispasticity agents, phenol chemodenervation of nerve or at motor nerve branches. More invasive options include intrathecal balcofen adminstration for appropriate candidates. Surgical options may include tendon lengthening or transposition or, rarely, dorsal rhizotomy.   History/Physical Examination: 49 y.o.  Year old right-handed female with history of hypertension, migraine headaches presents for follow up after right basal ganglia hemorrhage.   Modified [place]: Left 1/4 elbow flexors, 2/4 wrist flexors, 2/4 left finger flexors, 1+/4 ankle plantar flexors.  Previous Treatments: Therapy/Range of motion Indication for guidance: Target active muscules  Procedure: Botulinum toxin was mixed with preservative free saline with a dilution of 0.5cc to 100 units. Targeted limb and muscles were identified. The skin was prepped with alcohol swabs and placement of needle tip in targeted muscle was confirmed using appropriate guidance. Prior to injection, positioning of needle tip outside of blood vessel was determined by pulling back on syringe plunger.  MUSCLE UNITS  Left Med biceps: 100 units Left FCR: 100 units Left FCU: 100 units Left FDS: 400 units Left FDP: 400 units  Left Med  gastroc: 150 units   Left Lat gastroc: 150 units   Total units injected: 1400 100 units discarded due to unavoidable waste  Complications: None  Coben Godshall Anil Quintella Mura 10:50 AM

## 2020-04-01 ENCOUNTER — Encounter: Payer: Self-pay | Admitting: Internal Medicine

## 2020-04-03 ENCOUNTER — Ambulatory Visit (INDEPENDENT_AMBULATORY_CARE_PROVIDER_SITE_OTHER): Payer: 59 | Admitting: Internal Medicine

## 2020-04-03 ENCOUNTER — Encounter: Payer: Self-pay | Admitting: Internal Medicine

## 2020-04-03 ENCOUNTER — Other Ambulatory Visit: Payer: Self-pay

## 2020-04-03 VITALS — BP 149/75 | HR 70 | Temp 97.4°F | Resp 18 | Ht 59.0 in | Wt 168.0 lb

## 2020-04-03 DIAGNOSIS — I10 Essential (primary) hypertension: Secondary | ICD-10-CM | POA: Diagnosis not present

## 2020-04-03 MED ORDER — FUROSEMIDE 20 MG PO TABS: 20.0000 mg | ORAL_TABLET | Freq: Every day | ORAL | 0 refills | Status: DC

## 2020-04-03 NOTE — Patient Instructions (Addendum)
Go back on Lasix, we sent a prescription  Continue checking your blood pressure daily or every other day BP GOAL is between 110/65 and  135/85.  If it is consistently higher or lower, let me know      GO TO THE FRONT DESK, Sandy back for blood work in 2 weeks

## 2020-04-03 NOTE — Progress Notes (Signed)
Pre visit review using our clinic review tool, if applicable. No additional management support is needed unless otherwise documented below in the visit note. 

## 2020-04-03 NOTE — Progress Notes (Signed)
Subjective:    Patient ID: Abigail Campos, female    DOB: [birth year], 49 y.o.   MRN: [id number]  DOS:  04/03/2020 Type of visit - description: Acute, BP management  Recently her BP has been elevated, she is concerned. Would like to go back on Lasix.  Has noted some lower extremity edema particularly on the left and at the end of the day.  BP Readings from Last 3 Encounters:  04/03/20 (!) 149/75  03/28/20 (!) 160/102  03/01/20 106/62   Wt Readings from Last 3 Encounters:  04/03/20 168 lb (76.2 kg)  03/28/20 169 lb 9.6 oz (76.9 kg)  03/01/20 151 lb (68.5 kg)     Review of Systems Denies increasing stress, not taking NSAIDs, no increase in salt intake  Past Medical History:  Diagnosis Date  . Anxiety and depression   . Essential hypertension 10/19/2007   03-2010: metoprolol changed to bystolic (was not feeling well on it, no specific allergy or reaction)    . Hemiplegia ([place])   . Hyperlipemia   . Hypertension   . Migraine   . Neuromuscular disorder ([place])    left sided hemiplegia - has brace for l leg and walks with a cane  . Ovarian cancer ([place])   . Sleep apnea    not wearing c-pap yet  . Stroke [facility]) 2017    Past Surgical History:  Procedure Laterality Date  . ABDOMINAL HYSTERECTOMY  11-14-07   no oophorectomy per surgical report  . CESAREAN SECTION     [id number]  . INGUINAL HERNIA REPAIR     2004    Allergies as of 04/03/2020      Reactions   Bee Venom Anaphylaxis   Peanut-containing Drug Products Anaphylaxis   Hydrocodone Hives   Generalize itching w/o rash   Isovue [iopamidol] Hives, Itching   Pt broke out with one facial hive.  Had itching on her right upper ant and posterior arm w/o evidence of hives.  Pt given water and 25mg  benadryl po.  We observed pt for 30 minutes before d/c.  J. Bohm     Ambien [zolpidem Tartrate] Other (See Comments)   forgetfulness   Aspirin Swelling   Reports blisters w/ ASA but pt reports is ok w/ Motrin, Advil, naproxen     Latex Hives      Medication List       Accurate as of April 03, 2020 11:59 PM. If you have any questions, ask your nurse or doctor.        atorvastatin 20 MG tablet Commonly known as: LIPITOR Take 1 tablet (20 mg total) by mouth daily at 6 PM.   baclofen 20 MG tablet Commonly known as: LIORESAL TAKE 1 TABLET BY MOUTH THREE TIMES A DAY   buPROPion 100 MG tablet Commonly known as: WELLBUTRIN Take 1 tablet (100 mg total) by mouth 2 (two) times daily.   Bystolic 5 MG tablet Generic drug: nebivolol TAKE 1 TABLET BY MOUTH EVERY 12 HOURS   clopidogrel 75 MG tablet Commonly known as: PLAVIX Take 1 tablet (75 mg total) by mouth daily.   escitalopram 20 MG tablet Commonly known as: LEXAPRO Take 1 tablet (20 mg total) by mouth daily.   furosemide 20 MG tablet Commonly known as: LASIX Take 1 tablet (20 mg total) by mouth daily. Started by: Kathlene November, MD   gabapentin 300 MG capsule Commonly known as: NEURONTIN Take 1 capsule (300 mg total) by mouth 2 (two) times daily.   lisinopril 2.5  MG tablet Commonly known as: ZESTRIL Take 1 tablet (2.5 mg total) by mouth daily.   traZODone 100 MG tablet Commonly known as: DESYREL Take 1 tablet (100 mg total) by mouth at bedtime as needed for sleep.          Objective:   Physical Exam BP (!) 149/75 (BP Location: Right Arm, Patient Position: Sitting, Cuff Size: Normal)   Pulse 70   Temp (!) 97.4 F (36.3 C) (Temporal)   Resp 18   Ht 4\' 11"  (1.499 m)   Wt 168 lb (76.2 kg)   SpO2 100%   BMI 33.93 kg/m   General:   Well developed, NAD, BMI noted. HEENT:  Normocephalic . Face symmetric, atraumatic Lungs:  CTA B Normal respiratory effort, no intercostal retractions, no accessory muscle use. Heart: RRR,  no murmur.  Lower extremities: Trace left pretibial edema Skin: Not pale. Not jaundice Neurologic:  alert & oriented X3.  Speech normal, + left-sided weakness, at baseline Psych--  Cognition and judgment appear  intact.  Cooperative with normal attention span and concentration.  Behavior appropriate. No anxious or depressed appearing.      Assessment     Assessment   Prediabetes: A1c 6.0 (April 2017)  HTN Hyperlipidemia Depression, insomnia GERD Stroke 03-2016: [place] (L)  spastic hemiplegia   ICH, right basilar ganglia due to HTN emergency, + encephalopathy, + dysphagia, aphasia, dysarthria Residual L spasticity.  CTA neck 03-2016 neg CTA head 10-17 (-) aneurysm Headaches , migraines dx after 2nd pregnancy, (-) CT head 2014 and 2015 Insomnia   Cough, persisting, saw Dr. Melvyn Novas 11-2015 OSA   PLAN: HTN: She is currently on lisinopril and Bystolic.  On November 2020 Lasix was discontinued because she was doing really well however at this point she is having some lower extremity edema and  BP is elevated. Plan: Restart Lasix, monitor BPs, BMP in 2 weeks.  Continue other meds. OSA: Still working on getting a dental appliance. RTC 2 weeks labs Follow-up already scheduled for 06-2020 CPX   This visit occurred during the SARS-CoV-2 public health emergency.  Safety protocols were in place, including screening questions prior to the visit, additional usage of staff PPE, and extensive cleaning of exam room while observing appropriate contact time as indicated for disinfecting solutions.

## 2020-04-04 NOTE — Assessment & Plan Note (Signed)
HTN: She is currently on lisinopril and Bystolic.  On November 2020 Lasix was discontinued because she was doing really well however at this point she is having some lower extremity edema and  BP is elevated. Plan: Restart Lasix, monitor BPs, BMP in 2 weeks.  Continue other meds. OSA: Still working on getting a dental appliance. RTC 2 weeks labs Follow-up already scheduled for 06-2020 CPX

## 2020-04-18 ENCOUNTER — Other Ambulatory Visit: Payer: Self-pay | Admitting: Physical Medicine & Rehabilitation

## 2020-04-25 ENCOUNTER — Other Ambulatory Visit: Payer: Self-pay | Admitting: Internal Medicine

## 2020-05-03 ENCOUNTER — Telehealth: Payer: Self-pay | Admitting: Internal Medicine

## 2020-05-03 NOTE — Telephone Encounter (Signed)
See last office visit, she is overdue for a BMP, DX HTN, please arrange

## 2020-05-03 NOTE — Telephone Encounter (Signed)
Mychart message sent.

## 2020-05-04 ENCOUNTER — Other Ambulatory Visit: Payer: Self-pay | Admitting: Internal Medicine

## 2020-05-06 NOTE — Telephone Encounter (Signed)
Lab appt on 05/09/2020. Will refill.

## 2020-05-07 ENCOUNTER — Encounter: Payer: No Typology Code available for payment source | Admitting: Physical Medicine & Rehabilitation

## 2020-05-08 ENCOUNTER — Other Ambulatory Visit: Payer: Self-pay | Admitting: Internal Medicine

## 2020-05-09 ENCOUNTER — Other Ambulatory Visit: Payer: Self-pay

## 2020-05-09 ENCOUNTER — Other Ambulatory Visit (INDEPENDENT_AMBULATORY_CARE_PROVIDER_SITE_OTHER): Payer: Medicare Other

## 2020-05-09 ENCOUNTER — Encounter: Payer: Self-pay | Admitting: Physical Medicine & Rehabilitation

## 2020-05-09 ENCOUNTER — Encounter
Payer: No Typology Code available for payment source | Attending: Physical Medicine & Rehabilitation | Admitting: Physical Medicine & Rehabilitation

## 2020-05-09 VITALS — BP 122/78 | HR 85 | Temp 97.3°F | Ht 59.0 in | Wt 168.4 lb

## 2020-05-09 DIAGNOSIS — I10 Essential (primary) hypertension: Secondary | ICD-10-CM

## 2020-05-09 DIAGNOSIS — I69154 Hemiplegia and hemiparesis following nontraumatic intracerebral hemorrhage affecting left non-dominant side: Secondary | ICD-10-CM | POA: Insufficient documentation

## 2020-05-09 DIAGNOSIS — M65311 Trigger thumb, right thumb: Secondary | ICD-10-CM | POA: Diagnosis not present

## 2020-05-09 DIAGNOSIS — R269 Unspecified abnormalities of gait and mobility: Secondary | ICD-10-CM | POA: Diagnosis not present

## 2020-05-09 DIAGNOSIS — G479 Sleep disorder, unspecified: Secondary | ICD-10-CM

## 2020-05-09 LAB — BASIC METABOLIC PANEL
BUN: 7 mg/dL (ref 6–23)
CO2: 27 mEq/L (ref 19–32)
Calcium: 8.7 mg/dL (ref 8.4–10.5)
Chloride: 103 mEq/L (ref 96–112)
Creatinine, Ser: 0.96 mg/dL (ref 0.40–1.20)
GFR: 75.54 mL/min (ref >60.00)
Glucose, Bld: 108 mg/dL — ABNORMAL HIGH (ref 70–99)
Potassium: 4.1 mEq/L (ref 3.5–5.1)
Sodium: 135 mEq/L (ref 135–145)

## 2020-05-09 NOTE — Progress Notes (Signed)
Subjective:    Patient ID: Abigail Campos, female    DOB: 1971-07-13, 49 y.o.   MRN: [id number]  HPI:  Right-handed female with history of hypertension, migraine headaches presents for follow up after right basal ganglia hemorrhage.   Last clinic visit 03/28/2020.  She had Dysport injection at that time.  Since that time, patient states she tolerated procedure well.  She is tolerating the increase in baclofen.  She continues to use a cane for ambulation, denies falls.  She denies triggering of the thumb at present.  She is doing therapies.  Pain Inventory Average Pain 4 Pain Right Now 4 My pain is aching  In the last 24 hours, has pain interfered with the following? General activity 4 Relation with others 1 Enjoyment of life 2 What TIME of day is your pain at its worst? night Sleep (in general) Poor  Pain is worse with: walking and standing Pain improves with: rest Relief from Meds: 0  Mobility walk with assistance use a cane how many minutes can you walk? 10 ability to climb steps?  yes do you drive?  no Do you have any goals in this area?  yes  Function disabled: date disabled . Do you have any goals in this area?  no  Neuro/Psych trouble walking spasms confusion  Prior Studies Any changes since last visit?  no  Physicians involved in your care Any changes since last visit?  no   Family History  Problem Relation Age of Onset  . Diabetes Mother   . Hypertension Mother   . Glaucoma Mother   . Colon cancer Mother        mother age 59  . Rectal cancer Father        dx 55  . Stroke Paternal Grandfather   . Stroke Maternal Grandfather   . Diabetes Brother   . Hypertension Brother   . Heart disease Maternal Aunt        x 2 did 2012 CAD-CHF  . Diabetes Brother   . Hypertension Brother   . Lung cancer Maternal Aunt   . Breast cancer Neg Hx   . Esophageal cancer Neg Hx   . Stomach cancer Neg Hx    Social History   Socioeconomic History  .  Marital status: Married    Spouse name: Not on file  . Number of children: 2  . Years of education: Not on file  . Highest education level: Not on file  Occupational History  . Occupation: disable d/t strokeTEACHER    Employer: [employer]  Tobacco Use  . Smoking status: Never Smoker  . Smokeless tobacco: Never Used  Substance and Sexual Activity  . Alcohol use: No  . Drug use: No  . Sexual activity: Not Currently    Birth control/protection: Surgical  Other Topics Concern  . Not on file  Social History Narrative   Used to live independently, stroke 03-2016, d/c from rehab to his mother's house 04-2016, then moved back w/ her husband as off  02/2020   Daughter lives in [place],   [place] lives w/ pt   Social Determinants of Health   Financial Resource Strain:   . Difficulty of Paying Living Expenses:   Food Insecurity:   . Worried About Charity fundraiser in the Last Year:   . Arboriculturist in the Last Year:   Transportation Needs:   . Film/video editor (Medical):   Marland Kitchen Lack of Transportation (Non-Medical):   Physical  Activity:   . Days of Exercise per Week:   . Minutes of Exercise per Session:   Stress:   . Feeling of Stress :   Social Connections:   . Frequency of Communication with Friends and Family:   . Frequency of Social Gatherings with Friends and Family:   . Attends Religious Services:   . Active Member of Clubs or Organizations:   . Attends Archivist Meetings:   Marland Kitchen Marital Status:    Past Surgical History:  Procedure Laterality Date  . ABDOMINAL HYSTERECTOMY  11-14-07   no oophorectomy per surgical report  . CESAREAN SECTION     [id number]  . INGUINAL HERNIA REPAIR     2004   Past Medical History:  Diagnosis Date  . Anxiety and depression   . Essential hypertension 10/19/2007   03-2010: metoprolol changed to bystolic (was not feeling well on it, no specific allergy or reaction)    . Hemiplegia ([place])   . Hyperlipemia   . Hypertension     . Migraine   . Neuromuscular disorder ([place])    left sided hemiplegia - has brace for l leg and walks with a cane  . Ovarian cancer ([place])   . Sleep apnea    not wearing c-pap yet  . Stroke ([place]) 2017   BP 122/78   Pulse 85   Temp (!) 97.3 F (36.3 C)   Ht 4\' 11"  (1.499 m)   Wt 168 lb 6.4 oz (76.4 kg)   SpO2 95%   BMI 34.01 kg/m   Opioid Risk Score:   Fall Risk Score:  `1  Depression screen PHQ 2/9  Depression screen [facility] 2/9 05/09/2020 07/12/2019 05/04/2019 04/06/2019 08/05/2018 01/28/2018  Decreased Interest 1 2 1 1  0 0  Down, Depressed, Hopeless 1 2 1 1  0 0  PHQ - 2 Score 2 4 2 2  0 0  Altered sleeping - 3 - - - -  Tired, decreased energy - 3 - - - -  Change in appetite - 3 - - - -  Feeling bad or failure about yourself  - 3 - - - -  Trouble concentrating - 2 - - - -  Moving slowly or fidgety/restless - 2 - - - -  Suicidal thoughts - 0 - - - -  PHQ-9 Score - 20 - - - -  Some recent data might be hidden   Review of Systems  Musk: Gait abnormality Neurological: Positive for weakness, arthralgia.  All other systems reviewed and are negative.     Objective:     Physical Exam Constitutional: NAD. Musc: No edema in extremities.  No tenderness in extremities. Gait: Hemiparetic Neurological: Alert Motor:  LUE: Shoulder abduction 2-/5, elbow flex 0/5, elbow ext 0/5, hand grip 0/5.  LLE: Hip flexion 4-/5, knee extension 4/5, ADF/PF 1/5 Modified [place]: Left 0/4 elbow flexors, wrist flexors 1/5, 2/4 left finger flexors, 1+/4 ankle plantar flexors.     Assessment & Plan:  Right-handed female with history of hypertension, migraine headaches presents for follow up after right basal ganglia hemorrhage.    1. 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 affecting left nondominant side             Continue meds             Cont Follow up Neurology             Spastic hemiplegia affecting left non-dominant side  Continue baclofen 20mg  TID               Cont Dysport injection:                         Left Med biceps: Cont 100 units                         Left FCR: Cont 100 units                         Left FCU: Cont 100 units                         Left FDS: increase to 450 units                         Left FDP: increase to 450 units                          Left Med gastroc:Cont 150                          Left Lat gastroc: Cont 150              Cont HEP             Cont WHO              Patient would like information of group therapy  2. Gait abnormality             Continue quad cane             Cont Therapies, attempting to get restarted  3. Right trigger thumb             Improved with no locking             Overuse injury as well with positioning             Figure 8 splint, not using at present             Pt to consider surgery referral in future if necessary             Good benefit with injection on 10/29             Hold off on further injection at present  4. Sleep disturbance             Following up with sleep specialist, continues to be poor

## 2020-05-11 ENCOUNTER — Other Ambulatory Visit: Payer: Self-pay | Admitting: Internal Medicine

## 2020-05-16 ENCOUNTER — Other Ambulatory Visit: Payer: Self-pay | Admitting: Internal Medicine

## 2020-05-16 ENCOUNTER — Other Ambulatory Visit: Payer: Self-pay | Admitting: Physical Medicine & Rehabilitation

## 2020-05-27 ENCOUNTER — Other Ambulatory Visit: Payer: Self-pay | Admitting: Internal Medicine

## 2020-05-30 ENCOUNTER — Other Ambulatory Visit: Payer: Self-pay | Admitting: Internal Medicine

## 2020-06-04 ENCOUNTER — Ambulatory Visit: Payer: No Typology Code available for payment source | Admitting: Family

## 2020-06-04 ENCOUNTER — Encounter: Payer: Self-pay | Admitting: Internal Medicine

## 2020-06-04 DIAGNOSIS — Z0289 Encounter for other administrative examinations: Secondary | ICD-10-CM

## 2020-06-05 NOTE — Telephone Encounter (Signed)
appointment was schedule with patient

## 2020-06-06 ENCOUNTER — Ambulatory Visit: Payer: No Typology Code available for payment source | Admitting: Medical

## 2020-06-07 ENCOUNTER — Other Ambulatory Visit: Payer: Self-pay

## 2020-06-07 ENCOUNTER — Ambulatory Visit (INDEPENDENT_AMBULATORY_CARE_PROVIDER_SITE_OTHER): Payer: No Typology Code available for payment source | Admitting: Medical

## 2020-06-07 VITALS — BP 129/73 | HR 76 | Temp 98.2°F | Resp 16 | Ht 59.0 in | Wt 172.0 lb

## 2020-06-07 DIAGNOSIS — M545 Low back pain, unspecified: Secondary | ICD-10-CM

## 2020-06-07 MED ORDER — PREDNISONE 10 MG (21) PO TBPK
ORAL_TABLET | ORAL | 0 refills | Status: DC
Start: 2020-06-07 — End: 2020-06-24

## 2020-06-07 NOTE — Patient Instructions (Signed)
For your recent back pain will rx prednisone taper dose. Continue baclofen.  Xray of lumbar spine if pain persists.  If pain persists might rx tramadol but hx of allergy norco and you expresses concern for side effect. Will rx tramadol if needed but not today.  Can try [facility] lidocaine patch to lower back.  Follow up 7 days or as needed

## 2020-06-07 NOTE — Progress Notes (Signed)
Subjective:    Patient ID: Abigail Campos, female    DOB: [birth year], 49 y.o.   MRN: [id number]  HPI  Pt in with 4 days of low back pain. Pt states she is using baclofen and tylenol. She states pain is in  lower back and sometimes goes up to bra strap rt side occasionally. No rash on back.   No radiating pain but she notes her left lower leg is numb from prior stroke.  Pain is about 5-6/10 at rest. At night at times pain 7-8/10.   No hx of chronic low back pain. She states pain is new. She states shortly after getting dressed felt onset of pain.  Hx of good kidney function.  Not diabetes on a1c checks.  No hx of gastric ulcers.      Review of Systems  Constitutional: Negative for chills, fatigue and fever.  Respiratory: Negative for chest tightness, shortness of breath, wheezing and stridor.   Cardiovascular: Negative for chest pain and palpitations.  Gastrointestinal: Negative for abdominal pain.  Musculoskeletal: Positive for back pain. Negative for myalgias.  Neurological: Negative for dizziness.  Hematological: Negative for adenopathy. Does not bruise/bleed easily.  Psychiatric/Behavioral: Negative for behavioral problems and confusion.   Past Medical History:  Diagnosis Date  . Anxiety and depression   . Essential hypertension 10/19/2007   03-2010: metoprolol changed to bystolic (was not feeling well on it, no specific allergy or reaction)    . Hemiplegia ([place])   . Hyperlipemia   . Hypertension   . Migraine   . Neuromuscular disorder ([place])    left sided hemiplegia - has brace for l leg and walks with a cane  . Ovarian cancer ([place])   . Sleep apnea    not wearing c-pap yet  . Stroke [facility]) 2017     Social History   Socioeconomic History  . Marital status: Married    Spouse name: Not on file  . Number of children: 2  . Years of education: Not on file  . Highest education level: Not on file  Occupational History  . Occupation: disable d/t strokeTEACHER      Employer: [employer]  Tobacco Use  . Smoking status: Never Smoker  . Smokeless tobacco: Never Used  Vaping Use  . Vaping Use: Never used  Substance and Sexual Activity  . Alcohol use: No  . Drug use: No  . Sexual activity: Not Currently    Birth control/protection: Surgical  Other Topics Concern  . Not on file  Social History Narrative   Used to live independently, stroke 03-2016, d/c from rehab to his mother's house 04-2016, then moved back w/ her husband as off  02/2020   Daughter lives in [place],   [place] lives w/ pt   Social Determinants of Health   Financial Resource Strain:   . Difficulty of Paying Living Expenses:   Food Insecurity:   . Worried About Charity fundraiser in the Last Year:   . Arboriculturist in the Last Year:   Transportation Needs:   . Film/video editor (Medical):   Marland Kitchen Lack of Transportation (Non-Medical):   Physical Activity:   . Days of Exercise per Week:   . Minutes of Exercise per Session:   Stress:   . Feeling of Stress :   Social Connections:   . Frequency of Communication with Friends and Family:   . Frequency of Social Gatherings with Friends and Family:   . Attends Religious Services:   .  Active Member of Clubs or Organizations:   . Attends Archivist Meetings:   Marland Kitchen Marital Status:   Intimate Partner Violence:   . Fear of Current or Ex-Partner:   . Emotionally Abused:   Marland Kitchen Physically Abused:   . Sexually Abused:     Past Surgical History:  Procedure Laterality Date  . ABDOMINAL HYSTERECTOMY  11-14-07   no oophorectomy per surgical report  . CESAREAN SECTION     [id number]  . INGUINAL HERNIA REPAIR     2004    Family History  Problem Relation Age of Onset  . Diabetes Mother   . Hypertension Mother   . Glaucoma Mother   . Colon cancer Mother        mother age 89  . Rectal cancer Father        dx 47  . Stroke Paternal Grandfather   . Stroke Maternal Grandfather   . Diabetes Brother   . Hypertension  Brother   . Heart disease Maternal Aunt        x 2 did 2012 CAD-CHF  . Diabetes Brother   . Hypertension Brother   . Lung cancer Maternal Aunt   . Breast cancer Neg Hx   . Esophageal cancer Neg Hx   . Stomach cancer Neg Hx     Allergies  Allergen Reactions  . Bee Venom Anaphylaxis  . Peanut-Containing Drug Products Anaphylaxis  . Hydrocodone Hives    Generalize itching w/o rash  . Isovue [Iopamidol] Hives and Itching    Pt broke out with one facial hive.  Had itching on her right upper ant and posterior arm w/o evidence of hives.  Pt given water and 25mg  benadryl po.  We observed pt for 30 minutes before d/c.  J. Bohm    . Ambien [Zolpidem Tartrate] Other (See Comments)    forgetfulness  . Aspirin Swelling    Reports blisters w/ ASA but pt reports is ok w/ Motrin, Advil, naproxen  . Latex Hives    Current Outpatient Medications on File Prior to Visit  Medication Sig Dispense Refill  . atorvastatin (LIPITOR) 20 MG tablet Take 1 tablet (20 mg total) by mouth daily at 6 PM. 90 tablet 3  . baclofen (LIORESAL) 20 MG tablet TAKE 1 TABLET BY MOUTH THREE TIMES A DAY 90 tablet 1  . buPROPion (WELLBUTRIN) 100 MG tablet Take 1 tablet (100 mg total) by mouth 2 (two) times daily. 180 tablet 3  . clopidogrel (PLAVIX) 75 MG tablet Take 1 tablet (75 mg total) by mouth daily. 90 tablet 3  . escitalopram (LEXAPRO) 20 MG tablet Take 1 tablet (20 mg total) by mouth daily. 90 tablet 1  . furosemide (LASIX) 20 MG tablet Take 1 tablet (20 mg total) by mouth daily. 90 tablet 0  . gabapentin (NEURONTIN) 300 MG capsule Take 1 capsule (300 mg total) by mouth 2 (two) times daily. 60 capsule 2  . lisinopril (ZESTRIL) 2.5 MG tablet Take 1 tablet (2.5 mg total) by mouth daily. 90 tablet 0  . nebivolol (BYSTOLIC) 5 MG tablet Take 1 tablet (5 mg total) by mouth every 12 (twelve) hours. 180 tablet 1  . traZODone (DESYREL) 100 MG tablet Take 1 tablet (100 mg total) by mouth at bedtime as needed for sleep. 90  tablet 1   No current facility-administered medications on file prior to visit.    BP 129/73 (BP Location: Left Arm, Patient Position: Sitting, Cuff Size: Normal)   Pulse 76   Temp  98.2 F (36.8 C) (Temporal)   Resp 16   Ht 4\' 11"  (1.499 m)   Wt 172 lb (78 kg)   SpO2 95%   BMI 34.74 kg/m       Objective:   Physical Exam  General- No acute distress. Pleasant patient. Neck- Full range of motion, no jvd Lungs- Clear, even and unlabored. Heart- regular rate and rhythm. Neurologic- CNII- XII grossly intact. Lower back- direct lumbar spine tenderness to palpation. No mid t-spine tenderness. Rt paraspinal upper lumbar/lower tspine tenderness.      Assessment & Plan:  For your recent back pain will rx prednisone taper dose. Continue baclofen.  Xray of lumbar spine if pain persists.  If pain persists might rx tramadol but hx of allergy norco and you expresses concern for side effect. Will rx tramadol if needed but not today.  Can try salon pas lidocaine patch to lower back.  Follow up 7 days or as needed  Mackie Pai, PA-C   Time spent with patient today was 20  minutes which consisted of chart review, discussing diagnosis, work up, treatment and documentation.

## 2020-06-08 ENCOUNTER — Other Ambulatory Visit: Payer: Self-pay | Admitting: Physical Medicine & Rehabilitation

## 2020-06-24 ENCOUNTER — Encounter: Payer: Self-pay | Admitting: Internal Medicine

## 2020-06-24 ENCOUNTER — Other Ambulatory Visit: Payer: Self-pay

## 2020-06-24 ENCOUNTER — Ambulatory Visit (INDEPENDENT_AMBULATORY_CARE_PROVIDER_SITE_OTHER): Payer: No Typology Code available for payment source | Admitting: Internal Medicine

## 2020-06-24 VITALS — BP 125/83 | HR 79 | Temp 98.2°F | Resp 18 | Ht 59.0 in | Wt 174.4 lb

## 2020-06-24 DIAGNOSIS — F329 Major depressive disorder, single episode, unspecified: Secondary | ICD-10-CM

## 2020-06-24 DIAGNOSIS — F419 Anxiety disorder, unspecified: Secondary | ICD-10-CM

## 2020-06-24 DIAGNOSIS — F32A Depression, unspecified: Secondary | ICD-10-CM

## 2020-06-24 DIAGNOSIS — G4700 Insomnia, unspecified: Secondary | ICD-10-CM

## 2020-06-24 DIAGNOSIS — I10 Essential (primary) hypertension: Secondary | ICD-10-CM

## 2020-06-24 MED ORDER — CARVEDILOL 6.25 MG PO TABS
6.2500 mg | ORAL_TABLET | Freq: Two times a day (BID) | ORAL | 6 refills | Status: DC
Start: 2020-06-24 — End: 2020-10-21

## 2020-06-24 NOTE — Progress Notes (Signed)
Subjective:    Patient ID: Abigail Campos, female    DOB: [birth year], 49 y.o.   MRN: [id number]  DOS:  06/24/2020 Type of visit - description: Follow-up (too early for a CPX) Since the last office visit she is doing well. One of her concerns is cost of medications. Emotionally doing okay despite the fact that her PHQ-9 is 17 (moderate).   Review of Systems See above   Past Medical History:  Diagnosis Date  . Anxiety and depression   . Essential hypertension 10/19/2007   03-2010: metoprolol changed to bystolic (was not feeling well on it, no specific allergy or reaction)    . Hemiplegia ([place])   . Hyperlipemia   . Hypertension   . Migraine   . Neuromuscular disorder ([place])    left sided hemiplegia - has brace for l leg and walks with a cane  . Ovarian cancer ([place])   . Sleep apnea    not wearing c-pap yet  . Stroke [facility]) 2017    Past Surgical History:  Procedure Laterality Date  . ABDOMINAL HYSTERECTOMY  11-14-07   no oophorectomy per surgical report  . CESAREAN SECTION     [id number]  . INGUINAL HERNIA REPAIR     2004    Allergies as of 06/24/2020      Reactions   Bee Venom Anaphylaxis   Peanut-containing Drug Products Anaphylaxis   Hydrocodone Hives   Generalize itching w/o rash   Isovue [iopamidol] Hives, Itching   Pt broke out with one facial hive.  Had itching on her right upper ant and posterior arm w/o evidence of hives.  Pt given water and 25mg  benadryl po.  We observed pt for 30 minutes before d/c.  J. Bohm     Ambien [zolpidem Tartrate] Other (See Comments)   forgetfulness   Aspirin Swelling   Reports blisters w/ ASA but pt reports is ok w/ Motrin, Advil, naproxen   Latex Hives      Medication List       Accurate as of June 24, 2020 11:59 PM. If you have any questions, ask your nurse or doctor.        STOP taking these medications   nebivolol 5 MG tablet Commonly known as: Bystolic Stopped by: Kathlene November, MD   predniSONE 10 MG (21) Tbpk  tablet Commonly known as: STERAPRED UNI-PAK 21 TAB Stopped by: Kathlene November, MD     TAKE these medications   atorvastatin 20 MG tablet Commonly known as: LIPITOR Take 1 tablet (20 mg total) by mouth daily at 6 PM.   baclofen 20 MG tablet Commonly known as: LIORESAL TAKE 1 TABLET BY MOUTH THREE TIMES A DAY   buPROPion 100 MG tablet Commonly known as: WELLBUTRIN Take 1 tablet (100 mg total) by mouth 2 (two) times daily.   carvedilol 6.25 MG tablet Commonly known as: COREG Take 1 tablet (6.25 mg total) by mouth 2 (two) times daily with a meal. Started by: Kathlene November, MD   clopidogrel 75 MG tablet Commonly known as: PLAVIX Take 1 tablet (75 mg total) by mouth daily.   escitalopram 20 MG tablet Commonly known as: LEXAPRO Take 1 tablet (20 mg total) by mouth daily.   furosemide 20 MG tablet Commonly known as: LASIX Take 1 tablet (20 mg total) by mouth daily.   gabapentin 300 MG capsule Commonly known as: NEURONTIN Take 1 capsule (300 mg total) by mouth 2 (two) times daily.   lisinopril 2.5 MG tablet Commonly known  as: ZESTRIL Take 1 tablet (2.5 mg total) by mouth daily.   traZODone 100 MG tablet Commonly known as: DESYREL Take 1 tablet (100 mg total) by mouth at bedtime as needed for sleep.          Objective:   Physical Exam BP 125/83 (BP Location: Right Arm, Patient Position: Sitting, Cuff Size: Small)   Pulse 79   Temp 98.2 F (36.8 C) (Oral)   Resp 18   Ht 4\' 11"  (1.499 m)   Wt 174 lb 6 oz (79.1 kg)   SpO2 99%   BMI 35.22 kg/m  General:   Well developed, NAD, BMI noted. HEENT:  Normocephalic . Face symmetric, atraumatic Lungs:  CTA B Normal respiratory effort, no intercostal retractions, no accessory muscle use. Heart: RRR,  no murmur.  Lower extremities: no pretibial edema bilaterally  Skin: Not pale. Not jaundice Neurologic:  alert & oriented X3.  Speech normal, gait c/w left hemiplegia, sequela of a stroke Psych--  Cognition and judgment appear  intact.  Cooperative with normal attention span and concentration.  Behavior appropriate. No anxious or depressed appearing.      Assessment      Assessment   Prediabetes: A1c 6.0 (April 2017)  HTN Hyperlipidemia Depression, insomnia GERD Stroke 03-2016: [place] (L)  spastic hemiplegia   ICH, right basilar ganglia due to HTN emergency, + encephalopathy, + dysphagia, aphasia, dysarthria Residual L spasticity.  CTA neck 03-2016 neg CTA head 10-17 (-) aneurysm Headaches , migraines dx after 2nd pregnancy, (-) CT head 2014 and 2015 Insomnia   Cough, persisting, saw Dr. Melvyn Novas 11-2015 OSA   PLAN: HTN: BP today is very good, normal ambulatory BPs, currently on Lasix, lisinopril, Bystolic.  Cost is an issue, DC Bystolic, start carvedilol, monitor BPs.  Reassess on RTC. Depression, insomnia: Despite slightly elevated PHQ-9, she feels well, still have some difficulty sleeping, she is on Desyrel and OTC melatonin.  Sleep tips provided.  Reassess on RTC Hot flashes: On gabapentin per gynecology. Discussed peventive care: Had a colonoscopy 09-2019, next per GI MMG: 08-2019 Seen by gynecology  Covid vaccination, patient is hesitant, d/w pt pro>>cons RTC 3 months CPX  This visit occurred during the SARS-CoV-2 public health emergency.  Safety protocols were in place, including screening questions prior to the visit, additional usage of staff PPE, and extensive cleaning of exam room while observing appropriate contact time as indicated for disinfecting solutions.

## 2020-06-24 NOTE — Progress Notes (Signed)
Pre visit review using our clinic review tool, if applicable. No additional management support is needed unless otherwise documented below in the visit note. 

## 2020-06-24 NOTE — Patient Instructions (Addendum)
Please schedule Medicare Wellness with Glenard Haring.   COVID-19 Vaccine Information can be found at: [email or web address] For questions related to vaccine distribution or appointments, please email [email or web address] or call [phone].    Stop nivibolol and start carvedilol which is less expensive, take it twice a day.  Continue checking your blood pressures.  BP GOAL is between 110/65 and  135/85. If it is consistently higher or lower, let me know   Continue melatonin  HEALTHY SLEEP Sleep hygiene: Basic rules for a good night's sleep  Sleep only as much as you need to feel rested and then get out of bed  Keep a regular sleep schedule  Avoid forcing sleep  Exercise regularly for at least 20 minutes, preferably 4 to 5 hours before bedtime  Avoid caffeinated beverages after lunch  Avoid alcohol near bedtime: no "night cap"  Avoid smoking, especially in the evening  Do not go to bed hungry  Adjust bedroom environment  Avoid prolonged use of light-emitting screens before bedtime   Deal with your worries before bedtime      GO TO THE FRONT DESK, PLEASE SCHEDULE YOUR APPOINTMENTS Come back for a physical in 3 months

## 2020-06-25 NOTE — Assessment & Plan Note (Signed)
HTN: BP today is very good, normal ambulatory BPs, currently on Lasix, lisinopril, Bystolic.  Cost is an issue, DC Bystolic, start carvedilol, monitor BPs.  Reassess on RTC. Depression, insomnia: Despite slightly elevated PHQ-9, she feels well, still have some difficulty sleeping, she is on Desyrel and OTC melatonin.  Sleep tips provided.  Reassess on RTC Hot flashes: On gabapentin per gynecology. Discussed peventive care: Had a colonoscopy 09-2019, next per GI MMG: 08-2019 Seen by gynecology  Covid vaccination, patient is hesitant, d/w pt pro>>cons RTC 3 months CPX

## 2020-07-01 ENCOUNTER — Encounter: Payer: Self-pay | Admitting: Physical Medicine & Rehabilitation

## 2020-07-01 ENCOUNTER — Encounter
Payer: No Typology Code available for payment source | Attending: Physical Medicine & Rehabilitation | Admitting: Physical Medicine & Rehabilitation

## 2020-07-01 ENCOUNTER — Other Ambulatory Visit: Payer: Self-pay

## 2020-07-01 VITALS — BP 132/83 | HR 73 | Temp 98.6°F | Ht 59.0 in | Wt 174.0 lb

## 2020-07-01 DIAGNOSIS — M65311 Trigger thumb, right thumb: Secondary | ICD-10-CM | POA: Insufficient documentation

## 2020-07-01 DIAGNOSIS — I69154 Hemiplegia and hemiparesis following nontraumatic intracerebral hemorrhage affecting left non-dominant side: Secondary | ICD-10-CM | POA: Diagnosis not present

## 2020-07-01 NOTE — Progress Notes (Signed)
Dysport: Procedure Note Patient Name: Abigail Campos DOB: [birth year] MRN: [id number] Date: 07/01/20  Procedure: Botulinum toxin administration Guidance: EMG Diagnosis: Spastic left nondominant IPH Attending: Delice Lesch, MD   Informed consent: Risks, benefits & options of the procedure are explained to the patient (and/or family). The patient elects to proceed with procedure. Risks include but are not limited to weakness, respiratory distress, dry mouth, ptosis, antibody formation, worsening of some areas of function. Benefits include decreased abnormal muscle tone, improved hygiene and positioning, decreased skin breakdown and, in some cases, decreased pain. Options include conservative management with oral antispasticity agents, phenol chemodenervation of nerve or at motor nerve branches. More invasive options include intrathecal balcofen adminstration for appropriate candidates. Surgical options may include tendon lengthening or transposition or, rarely, dorsal rhizotomy.   History/Physical Examination: 49 y.o.  Year old right-handed female with history of hypertension, migraine headaches presents for follow up after right basal ganglia hemorrhage.   Modified [place]: Left 1/4 elbow flexors, 1/4 wrist flexors, 3/4 left finger flexors, 1+/4 ankle plantar flexors.  Previous Treatments: Therapy/Range of motion Indication for guidance: Target active muscules  Procedure: Botulinum toxin was mixed with preservative free saline with a dilution of 0.5cc to 100 units. Targeted limb and muscles were identified. The skin was prepped with alcohol swabs and placement of needle tip in targeted muscle was confirmed using appropriate guidance. Prior to injection, positioning of needle tip outside of blood vessel was determined by pulling back on syringe plunger.  MUSCLE UNITS  Left Med biceps: 100 units Left FCR: 100 units Left FCU: 100 units Left FDS: 450 units Left FDP: 450 units  Left Med  gastroc: 150 units   Left Lat gastroc: 150 units   Total units injected: 4650  Complications: None  Vaughan Garfinkle Anil Ayen Viviano 11:34 AM

## 2020-07-07 ENCOUNTER — Telehealth: Payer: Self-pay | Admitting: Internal Medicine

## 2020-07-07 DIAGNOSIS — K862 Cyst of pancreas: Secondary | ICD-10-CM

## 2020-07-07 NOTE — Telephone Encounter (Signed)
Please arrange for a MRI abdomen with and without contrast. DX cystic lesion pancreas follow-up.

## 2020-07-08 NOTE — Telephone Encounter (Signed)
Order placed, mychart message sent to Pt to make her aware.

## 2020-07-09 ENCOUNTER — Encounter (HOSPITAL_BASED_OUTPATIENT_CLINIC_OR_DEPARTMENT_OTHER): Payer: Self-pay | Admitting: Emergency Medicine

## 2020-07-09 ENCOUNTER — Emergency Department (HOSPITAL_BASED_OUTPATIENT_CLINIC_OR_DEPARTMENT_OTHER): Payer: No Typology Code available for payment source

## 2020-07-09 ENCOUNTER — Emergency Department (HOSPITAL_BASED_OUTPATIENT_CLINIC_OR_DEPARTMENT_OTHER)
Admission: EM | Admit: 2020-07-09 | Discharge: 2020-07-09 | Disposition: A | Discharge status: Home / Self Care | Payer: No Typology Code available for payment source | Attending: Emergency Medicine | Admitting: Emergency Medicine

## 2020-07-09 ENCOUNTER — Other Ambulatory Visit: Payer: Self-pay | Admitting: Physical Medicine & Rehabilitation

## 2020-07-09 ENCOUNTER — Other Ambulatory Visit: Payer: Self-pay

## 2020-07-09 DIAGNOSIS — W19XXXA Unspecified fall, initial encounter: Secondary | ICD-10-CM | POA: Diagnosis not present

## 2020-07-09 DIAGNOSIS — S91202A Unspecified open wound of left great toe with damage to nail, initial encounter: Secondary | ICD-10-CM | POA: Insufficient documentation

## 2020-07-09 DIAGNOSIS — S90932A Unspecified superficial injury of left great toe, initial encounter: Secondary | ICD-10-CM | POA: Diagnosis present

## 2020-07-09 DIAGNOSIS — M79672 Pain in left foot: Secondary | ICD-10-CM | POA: Diagnosis not present

## 2020-07-09 DIAGNOSIS — S91109A Unspecified open wound of unspecified toe(s) without damage to nail, initial encounter: Secondary | ICD-10-CM

## 2020-07-09 DIAGNOSIS — Z91010 Allergy to peanuts: Secondary | ICD-10-CM | POA: Insufficient documentation

## 2020-07-09 DIAGNOSIS — Y9301 Activity, walking, marching and hiking: Secondary | ICD-10-CM | POA: Insufficient documentation

## 2020-07-09 DIAGNOSIS — Z79899 Other long term (current) drug therapy: Secondary | ICD-10-CM | POA: Insufficient documentation

## 2020-07-09 DIAGNOSIS — S91102A Unspecified open wound of left great toe without damage to nail, initial encounter: Secondary | ICD-10-CM | POA: Diagnosis not present

## 2020-07-09 DIAGNOSIS — Y9289 Other specified places as the place of occurrence of the external cause: Secondary | ICD-10-CM | POA: Diagnosis not present

## 2020-07-09 DIAGNOSIS — C569 Malignant neoplasm of unspecified ovary: Secondary | ICD-10-CM | POA: Diagnosis not present

## 2020-07-09 DIAGNOSIS — Y999 Unspecified external cause status: Secondary | ICD-10-CM | POA: Insufficient documentation

## 2020-07-09 DIAGNOSIS — I10 Essential (primary) hypertension: Secondary | ICD-10-CM | POA: Insufficient documentation

## 2020-07-09 DIAGNOSIS — Z91040 Latex allergy status: Secondary | ICD-10-CM | POA: Insufficient documentation

## 2020-07-09 DIAGNOSIS — M7989 Other specified soft tissue disorders: Secondary | ICD-10-CM | POA: Diagnosis not present

## 2020-07-09 IMAGING — DX DG FOOT COMPLETE 3+V*L*
3 series · 3 of 3 positions shown · non-contrast
Comparison: Left foot series [DATE]

CLINICAL DATA: 48-year-old female with 1st toe wound near the nail
bed with recurrent bleeding.

EXAM:
LEFT FOOT - COMPLETE 3+ VIEW

[foot ap]
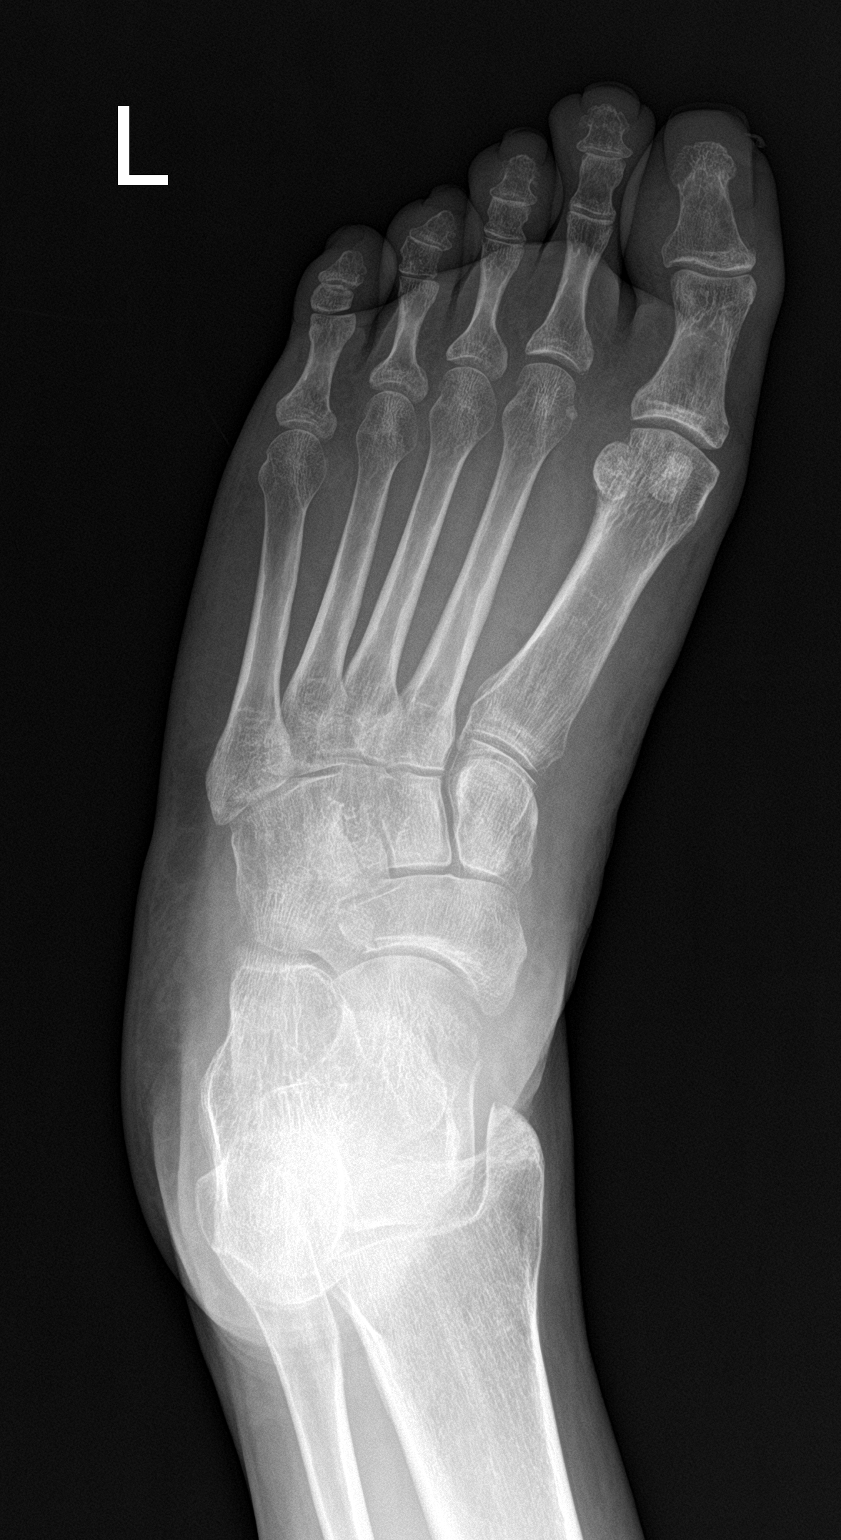

[foot obl]
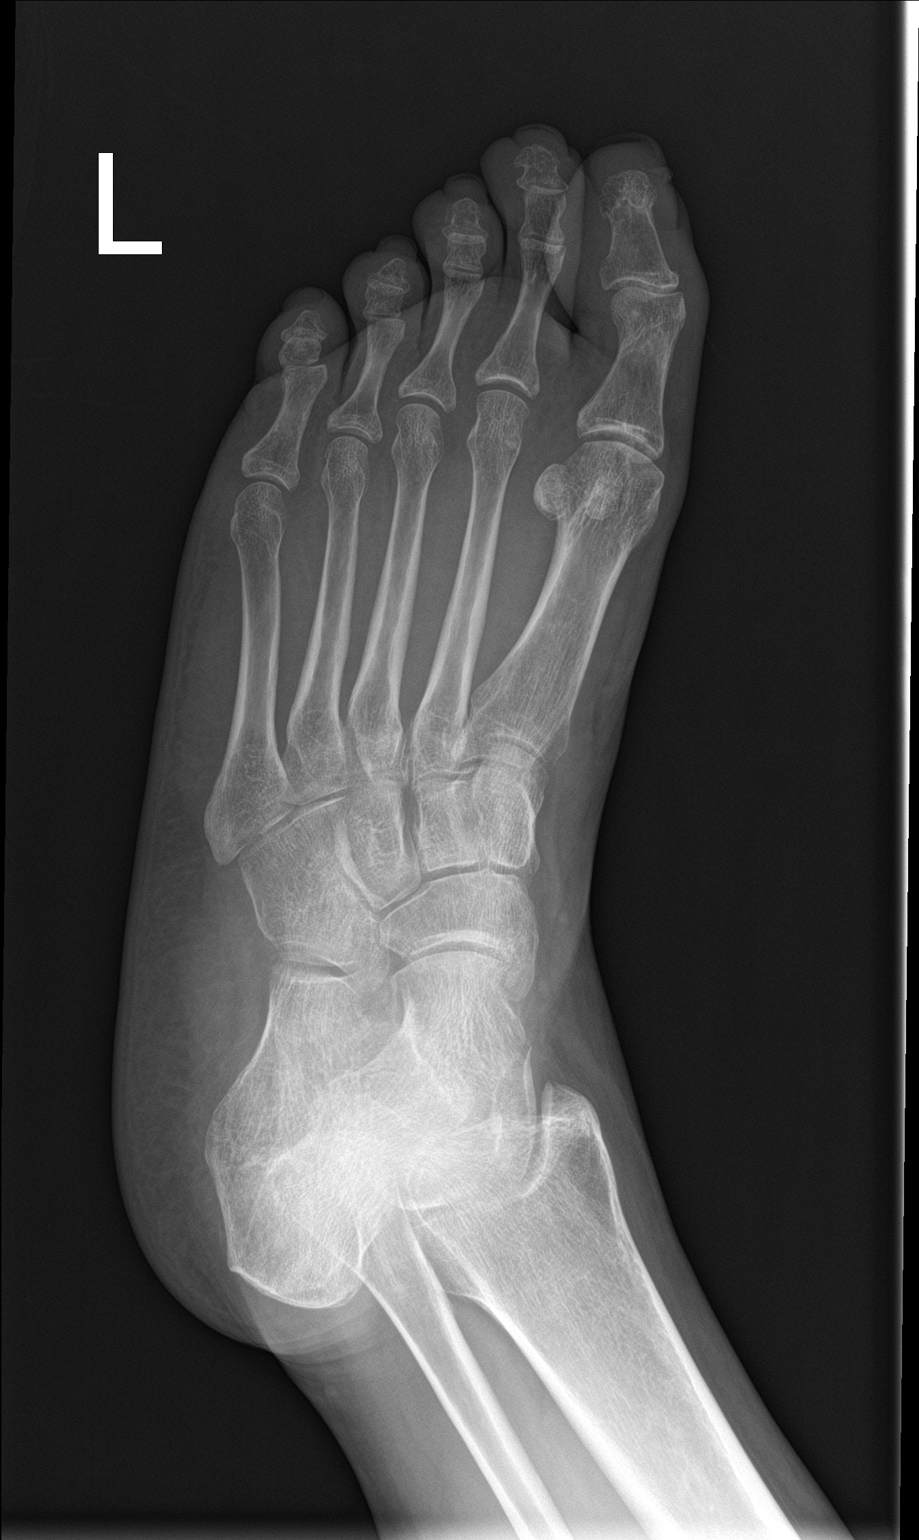

[foot lat]
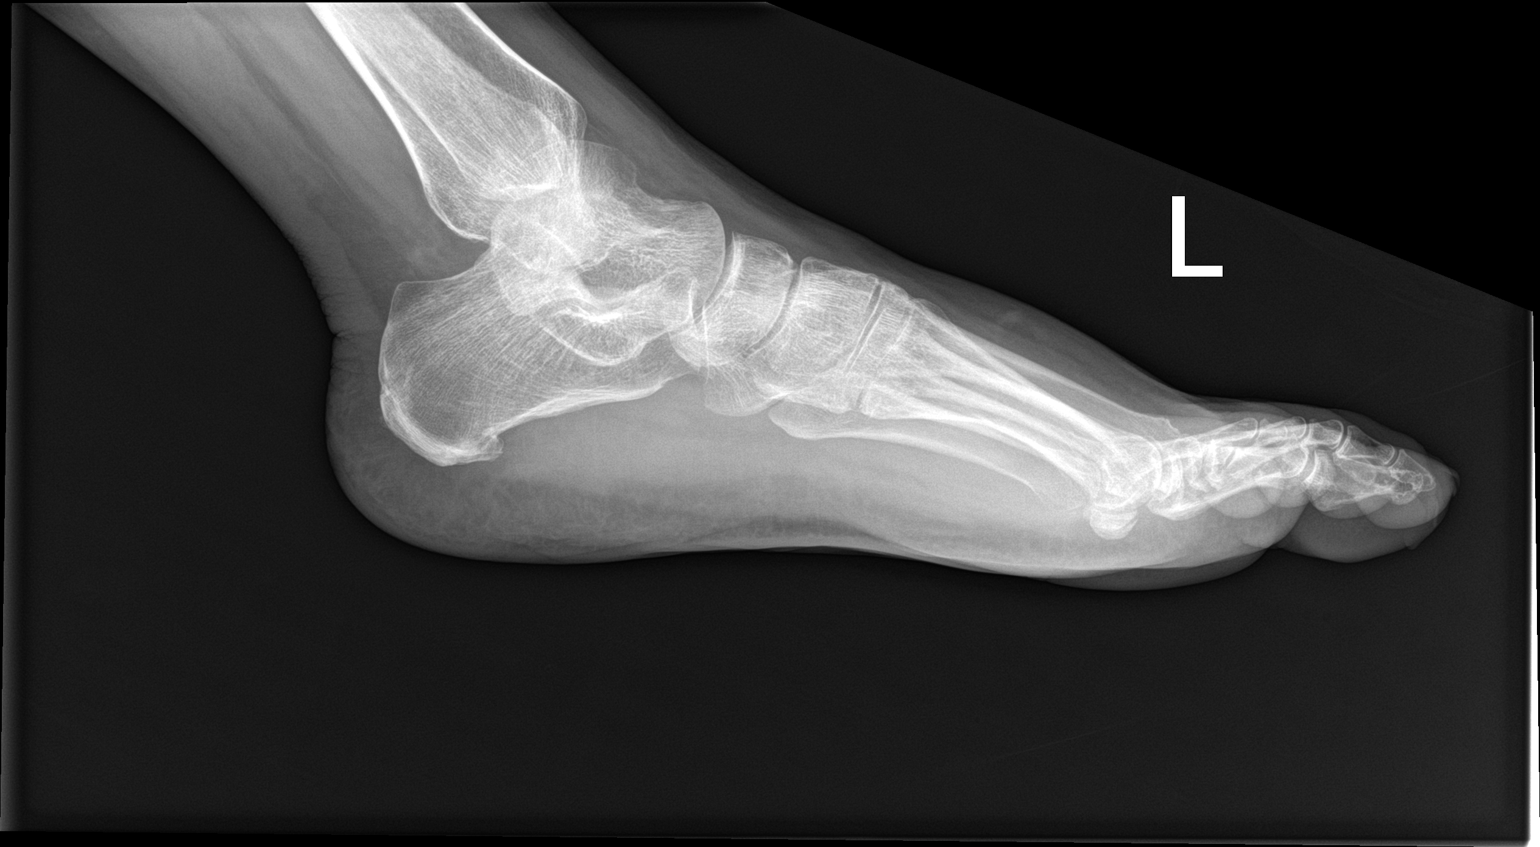

[3 of 3 positions shown; findings below may reference images not displayed]

FINDINGS: Chronic osteopenia in the lateral half of the tuft of the 1st distal
phalanx appears stable since [F4]. There is no fracture or
osteolysis identified in the 1st ray.

Other osseous structures in the left foot also appear stable and
intact. No soft tissue gas. Mild soft tissue irregularity at the
medial tip of the 1st toe.
IMPRESSION: No radiographic evidence of osteomyelitis or acute osseous
abnormality.

## 2020-07-09 NOTE — ED Triage Notes (Signed)
Pt reports injuring left great toe last night - injury noted to distal great toe, no apparent bleeding. Pt unsure how injury occurred

## 2020-07-09 NOTE — Medical Student Note (Signed)
[place] DEPT [facility] Provider Student Note For educational purposes for Medical, PA and NP students only and not part of the legal medical record.   [name]: [id number] Arrival date & time: 07/09/20  1528      History   Chief Complaint Chief Complaint  Patient presents with  . Toe Injury    HPI Abigail Campos is a 49 y.o. female with hx of HTN, HLD. Migraine, stroke (left sided hemiplegia), Ovarian caner.   Was cooking dinner last night and saw that  Left foot was bleeding. Early this AM, got up to use restroom and was unable to walk from bed to bathroom. No pain currently or any pain with sitting. Ambulates with cane due to stroke history.   HPI  Past Medical History:  Diagnosis Date  . Anxiety and depression   . Essential hypertension 10/19/2007   03-2010: metoprolol changed to bystolic (was not feeling well on it, no specific allergy or reaction)    . Hemiplegia ([place])   . Hyperlipemia   . Hypertension   . Migraine   . Neuromuscular disorder ([place])    left sided hemiplegia - has brace for l leg and walks with a cane  . Ovarian cancer ([place])   . Sleep apnea    not wearing c-pap yet  . Stroke [facility]) 2017    Patient Active Problem List   Diagnosis Date Noted  . Abnormality of gait 05/09/2020  . Hemiplegia of nondominant side, late effect of cerebrovascular disease ([place]) 03/28/2020  . Pleural effusion 03/01/2020  . Sleep disturbance 02/06/2020  . Spastic hemiplegia of left nondominant side due to nontraumatic intraparenchymal hemorrhage of brain ([place]) 12/26/2019  . Trigger thumb of right hand 11/02/2019  . Obstructive sleep apnea 07/26/2019  . Spastic hemiplegia of left nondominant side as late effect of nontraumatic intraparenchymal hemorrhage of brain ([place]) 04/06/2019  . Spastic hemiparesis affecting dominant side ([place]) 01/04/2018  . Hyperglycemia 12/29/2016  . Anxiety and depression 12/29/2016  . Muscle spasticity   . Obesity (BMI 30.0-34.9) 04/08/2016  .  Hyperlipidemia 04/08/2016  . Basal ganglia hemorrhage ([place]) 04/08/2016  . Dysphagia as late effect of cerebrovascular disease   . Migraine with aura and without status migrainosus, not intractable   . Gait disturbance, post-stroke   . Hemiplegia, post-stroke ([place])   . Aphasia, post-stroke   . Dysarthria, post-stroke   . ICH (intracerebral hemorrhage) ([facility]) - R basal ganglia due to hypertensive emergency 04/01/2016  . Upper airway cough syndrome 12/06/2015  . PCP NOTES >>>>>>>>>>>>>>>>>>>>>>>>>>>.. 09/25/2015  . Insomnia 12/28/2012  . Annual physical exam 11/23/2011  . Essential hypertension 10/19/2007    Past Surgical History:  Procedure Laterality Date  . ABDOMINAL HYSTERECTOMY  11-14-07   no oophorectomy per surgical report  . CESAREAN SECTION     [id number]  . INGUINAL HERNIA REPAIR     2004    OB History    Gravida  2   Para  2   Term  2   Preterm      AB      Living  2     SAB      TAB      Ectopic      Multiple      Live Births  2            Home Medications    Prior to Admission medications   Medication Sig Start Date End Date Taking? Authorizing Provider  atorvastatin (LIPITOR) 20 MG tablet Take 1  tablet (20 mg total) by mouth daily at 6 PM. 01/31/20   Colon Branch, MD  baclofen (LIORESAL) 20 MG tablet TAKE 1 TABLET BY MOUTH THREE TIMES A DAY 06/10/20   Jamse Arn, MD  buPROPion (WELLBUTRIN) 100 MG tablet Take 1 tablet (100 mg total) by mouth 2 (two) times daily. 02/19/20   Colon Branch, MD  carvedilol (COREG) 6.25 MG tablet Take 1 tablet (6.25 mg total) by mouth 2 (two) times daily with a meal. 06/24/20   Colon Branch, MD  clopidogrel (PLAVIX) 75 MG tablet Take 1 tablet (75 mg total) by mouth daily. 05/28/20   Colon Branch, MD  Cyanocobalamin (VITAMIN B 12 PO) Take 1 tablet by mouth daily.    [provider]  escitalopram (LEXAPRO) 20 MG tablet Take 1 tablet (20 mg total) by mouth daily. 05/14/20   Colon Branch, MD  furosemide (LASIX) 20 MG  tablet Take 1 tablet (20 mg total) by mouth daily. 05/30/20   Colon Branch, MD  gabapentin (NEURONTIN) 300 MG capsule Take 1 capsule (300 mg total) by mouth 2 (two) times daily. 03/18/20   [name], MD  lisinopril (ZESTRIL) 2.5 MG tablet Take 1 tablet (2.5 mg total) by mouth daily. 05/08/20   Colon Branch, MD  traZODone (DESYREL) 100 MG tablet Take 1 tablet (100 mg total) by mouth at bedtime as needed for sleep. 02/14/20   Colon Branch, MD    Family History Family History  Problem Relation Age of Onset  . Diabetes Mother   . Hypertension Mother   . Glaucoma Mother   . Colon cancer Mother        mother age 67  . Rectal cancer Father        dx 36  . Stroke Paternal Grandfather   . Stroke Maternal Grandfather   . Diabetes Brother   . Hypertension Brother   . Heart disease Maternal Aunt        x 2 did 2012 CAD-CHF  . Diabetes Brother   . Hypertension Brother   . Lung cancer Maternal Aunt   . Breast cancer Neg Hx   . Esophageal cancer Neg Hx   . Stomach cancer Neg Hx     Social History Social History   Tobacco Use  . Smoking status: Never Smoker  . Smokeless tobacco: Never Used  Vaping Use  . Vaping Use: Never used  Substance Use Topics  . Alcohol use: No  . Drug use: No     Allergies   Bee venom, Peanut-containing drug products, Hydrocodone, Isovue [iopamidol], Ambien [zolpidem tartrate], Aspirin, and Latex   Review of Systems Review of Systems   Physical Exam Updated Vital Signs BP 108/76 (BP Location: Left Arm)   Pulse 84   Temp 98.4 F (36.9 C) (Oral)   Resp 18   Ht 4\' 11"  (1.499 m)   Wt 76.2 kg   SpO2 99%   BMI 33.93 kg/m   Physical Exam   Laceration on left great toe, under the nail. Nail is cracked. Dried blood.   Reduced sensation on left foot. Pulses normal.    ED Treatments / Results  Labs (all labs ordered are listed, but only abnormal results are displayed) Labs Reviewed - No data to display  EKG  Radiology No results  found.  Procedures Procedures (including critical care time)  Medications Ordered in ED Medications - No data to display   Initial Impression / Assessment and Plan /  ED Course  I have reviewed the triage vital signs and the nursing notes.  Pertinent labs & imaging results that were available during my care of the patient were reviewed by me and considered in my medical decision making (see chart for details).     Final Clinical Impressions(s) / ED Diagnoses   Final diagnoses:  None    New Prescriptions New Prescriptions   No medications on file

## 2020-07-09 NOTE — Discharge Instructions (Addendum)
Follow up with podiatry, referral given. Assess where your brace is on your foot with the help of a family member. There is a callus on your toe that I am concerned may be coming from your brace rubbing your toe.

## 2020-07-09 NOTE — ED Provider Notes (Signed)
[facility] EMERGENCY DEPARTMENT Provider Note   [name]: [id number] Arrival date & time: 07/09/20  1528     History Chief Complaint  Patient presents with  . Toe Injury    Abigail Campos is a 49 y.o. female.  49 year old female with history as listed below, CVA with left side weakness, ambulates with a cane. Patient did not notice an injury to her left foot, was told by a family member her foot was bleeding last night. Upon waking this morning she was unable to walk because of pain in the foot. No pain at rest, is unable to put pressure/bear weight on this foot. No other injuries, complaints, concerns. Last td 2015.         Past Medical History:  Diagnosis Date  . Anxiety and depression   . Essential hypertension 10/19/2007   03-2010: metoprolol changed to bystolic (was not feeling well on it, no specific allergy or reaction)    . Hemiplegia ([place])   . Hyperlipemia   . Hypertension   . Migraine   . Neuromuscular disorder ([place])    left sided hemiplegia - has brace for l leg and walks with a cane  . Ovarian cancer ([place])   . Sleep apnea    not wearing c-pap yet  . Stroke [facility]) 2017    Patient Active Problem List   Diagnosis Date Noted  . Abnormality of gait 05/09/2020  . Hemiplegia of nondominant side, late effect of cerebrovascular disease ([place]) 03/28/2020  . Pleural effusion 03/01/2020  . Sleep disturbance 02/06/2020  . Spastic hemiplegia of left nondominant side due to nontraumatic intraparenchymal hemorrhage of brain ([place]) 12/26/2019  . Trigger thumb of right hand 11/02/2019  . Obstructive sleep apnea 07/26/2019  . Spastic hemiplegia of left nondominant side as late effect of nontraumatic intraparenchymal hemorrhage of brain ([place]) 04/06/2019  . Spastic hemiparesis affecting dominant side ([place]) 01/04/2018  . Hyperglycemia 12/29/2016  . Anxiety and depression 12/29/2016  . Muscle spasticity   . Obesity (BMI 30.0-34.9) 04/08/2016  . Hyperlipidemia  04/08/2016  . Basal ganglia hemorrhage ([place]) 04/08/2016  . Dysphagia as late effect of cerebrovascular disease   . Migraine with aura and without status migrainosus, not intractable   . Gait disturbance, post-stroke   . Hemiplegia, post-stroke ([place])   . Aphasia, post-stroke   . Dysarthria, post-stroke   . ICH (intracerebral hemorrhage) ([facility]) - R basal ganglia due to hypertensive emergency 04/01/2016  . Upper airway cough syndrome 12/06/2015  . PCP NOTES >>>>>>>>>>>>>>>>>>>>>>>>>>>.. 09/25/2015  . Insomnia 12/28/2012  . Annual physical exam 11/23/2011  . Essential hypertension 10/19/2007    Past Surgical History:  Procedure Laterality Date  . ABDOMINAL HYSTERECTOMY  11-14-07   no oophorectomy per surgical report  . CESAREAN SECTION     [id number]  . INGUINAL HERNIA REPAIR     2004     OB History    Gravida  2   Para  2   Term  2   Preterm      AB      Living  2     SAB      TAB      Ectopic      Multiple      Live Births  2           Family History  Problem Relation Age of Onset  . Diabetes Mother   . Hypertension Mother   . Glaucoma Mother   . Colon cancer Mother  mother age 92  . Rectal cancer Father        dx 66  . Stroke Paternal Grandfather   . Stroke Maternal Grandfather   . Diabetes Brother   . Hypertension Brother   . Heart disease Maternal Aunt        x 2 did 2012 CAD-CHF  . Diabetes Brother   . Hypertension Brother   . Lung cancer Maternal Aunt   . Breast cancer Neg Hx   . Esophageal cancer Neg Hx   . Stomach cancer Neg Hx     Social History   Tobacco Use  . Smoking status: Never Smoker  . Smokeless tobacco: Never Used  Vaping Use  . Vaping Use: Never used  Substance Use Topics  . Alcohol use: No  . Drug use: No    Home Medications Prior to Admission medications   Medication Sig Start Date End Date Taking? Authorizing Provider  atorvastatin (LIPITOR) 20 MG tablet Take 1 tablet (20 mg total) by mouth daily at 6  PM. 01/31/20   Colon Branch, MD  baclofen (LIORESAL) 20 MG tablet TAKE 1 TABLET BY MOUTH THREE TIMES A DAY 07/09/20   Jamse Arn, MD  buPROPion (WELLBUTRIN) 100 MG tablet Take 1 tablet (100 mg total) by mouth 2 (two) times daily. 02/19/20   Colon Branch, MD  carvedilol (COREG) 6.25 MG tablet Take 1 tablet (6.25 mg total) by mouth 2 (two) times daily with a meal. 06/24/20   Colon Branch, MD  clopidogrel (PLAVIX) 75 MG tablet Take 1 tablet (75 mg total) by mouth daily. 05/28/20   Colon Branch, MD  Cyanocobalamin (VITAMIN B 12 PO) Take 1 tablet by mouth daily.    [provider]  escitalopram (LEXAPRO) 20 MG tablet Take 1 tablet (20 mg total) by mouth daily. 05/14/20   Colon Branch, MD  furosemide (LASIX) 20 MG tablet Take 1 tablet (20 mg total) by mouth daily. 05/30/20   Colon Branch, MD  gabapentin (NEURONTIN) 300 MG capsule Take 1 capsule (300 mg total) by mouth 2 (two) times daily. 03/18/20   [name], MD  lisinopril (ZESTRIL) 2.5 MG tablet Take 1 tablet (2.5 mg total) by mouth daily. 05/08/20   Colon Branch, MD  traZODone (DESYREL) 100 MG tablet Take 1 tablet (100 mg total) by mouth at bedtime as needed for sleep. 02/14/20   Colon Branch, MD    Allergies    Bee venom, Peanut-containing drug products, Hydrocodone, Isovue [iopamidol], Ambien [zolpidem tartrate], Aspirin, and Latex  Review of Systems   Review of Systems  Constitutional: Negative for fever.  Musculoskeletal: Positive for arthralgias.  Skin: Positive for wound.  Allergic/Immunologic: Negative for immunocompromised state.    Physical Exam Updated Vital Signs BP 108/76 (BP Location: Left Arm)   Pulse 84   Temp 98.4 F (36.9 C) (Oral)   Resp 18   Ht 4\' 11"  (1.499 m)   Wt 76.2 kg   SpO2 99%   BMI 33.93 kg/m   Physical Exam Vitals and nursing note reviewed.  Cardiovascular:     Pulses: Normal pulses.  Musculoskeletal:        General: Tenderness present. No swelling or deformity.     Comments: Small  avulsion to tip to left great toe, not actively bleeding, does have dried blood below the nail and question if nail of great toe is slightly loose. Toe is tender. There is a small callous to tip of toe as  well.   Skin:    General: Skin is warm and dry.     Findings: No erythema or rash.  Neurological:     Sensory: Sensory deficit present.     ED Results / Procedures / Treatments   Labs (all labs ordered are listed, but only abnormal results are displayed) Labs Reviewed - No data to display  EKG None  Radiology DG Foot Complete Left  Result Date: 07/09/2020 CLINICAL DATA:  49 year old female with 1st toe wound near the nail bed with recurrent bleeding. EXAM: LEFT FOOT - COMPLETE 3+ VIEW COMPARISON:  Left foot series 06/18/2018 FINDINGS: Chronic osteopenia in the lateral half of the tuft of the 1st distal phalanx appears stable since 2019. There is no fracture or osteolysis identified in the 1st ray. Other osseous structures in the left foot also appear stable and intact. No soft tissue gas. Mild soft tissue irregularity at the medial tip of the 1st toe. IMPRESSION: No radiographic evidence of osteomyelitis or acute osseous abnormality. Electronically Signed   By: Genevie Ann M.D.   On: 07/09/2020 16:27    Procedures Procedures (including critical care time)  Medications Ordered in ED Medications - No data to display  ED Course  I have reviewed the triage vital signs and the nursing notes.  Pertinent labs & imaging results that were available during my care of the patient were reviewed by me and considered in my medical decision making (see chart for details).  Clinical Course as of Jul 09 1650  Tue Jul 27, 752  [id number] 49 year old female with left great toe pain as documented above.  Patient dates pain in the whole foot when she tries to bear weight, is currently using her wheelchair.  On exam, has a small avulsion to the tip of the left great toe which is not actively bleeding, dried  blood below the nail with question if the nail is slightly loose from the bed.  There is also a small callus in the area.  Patient wears a brace on her left leg which extends the tip of her toes, she does not have a brace with her here today.  Recommend that patient have a family member help her access where her brace sits on her foot, question of the callus is being caused by her brace.  Patient has not seen podiatry, she has diminished sensation into this foot secondary to her deficits from her stroke.  Recommend follow-up with podiatry for routine foot care, return to ER as needed.   [LM]    Clinical Course User Index [LM] Roque Lias   MDM Rules/Calculators/A&P                          Final Clinical Impression(s) / ED Diagnoses Final diagnoses:  Left foot pain  Avulsion of skin of toe, initial encounter    Rx / DC Orders ED Discharge Orders    None       Tacy Learn, PA-C 07/09/20 1651    Veryl Speak, MD 07/10/20 1541

## 2020-07-11 ENCOUNTER — Other Ambulatory Visit: Payer: Self-pay

## 2020-07-11 DIAGNOSIS — N951 Menopausal and female climacteric states: Secondary | ICD-10-CM

## 2020-07-11 MED ORDER — GABAPENTIN 300 MG PO CAPS: 300.0000 mg | ORAL_CAPSULE | Freq: Two times a day (BID) | ORAL | 5 refills | Status: DC

## 2020-07-11 NOTE — Telephone Encounter (Signed)
Patient requests refill of gabapentin for hot flashes. Kathrene Alu RN

## 2020-07-18 ENCOUNTER — Ambulatory Visit (INDEPENDENT_AMBULATORY_CARE_PROVIDER_SITE_OTHER): Payer: No Typology Code available for payment source | Admitting: Podiatry

## 2020-07-18 ENCOUNTER — Other Ambulatory Visit: Payer: Self-pay

## 2020-07-18 DIAGNOSIS — L600 Ingrowing nail: Secondary | ICD-10-CM

## 2020-07-18 DIAGNOSIS — R58 Hemorrhage, not elsewhere classified: Secondary | ICD-10-CM

## 2020-07-18 MED ORDER — CEPHALEXIN 500 MG PO CAPS: 500.0000 mg | ORAL_CAPSULE | Freq: Three times a day (TID) | ORAL | 0 refills | Status: DC

## 2020-07-20 ENCOUNTER — Other Ambulatory Visit: Payer: Self-pay

## 2020-07-20 ENCOUNTER — Ambulatory Visit (HOSPITAL_BASED_OUTPATIENT_CLINIC_OR_DEPARTMENT_OTHER)
Admission: RE | Admit: 2020-07-20 | Discharge: 2020-07-20 | Disposition: A | Discharge status: Home / Self Care | Payer: No Typology Code available for payment source | Source: Ambulatory Visit | Attending: Internal Medicine | Admitting: Internal Medicine

## 2020-07-20 DIAGNOSIS — K862 Cyst of pancreas: Secondary | ICD-10-CM | POA: Insufficient documentation

## 2020-07-20 DIAGNOSIS — K8689 Other specified diseases of pancreas: Secondary | ICD-10-CM | POA: Diagnosis not present

## 2020-07-20 DIAGNOSIS — K760 Fatty (change of) liver, not elsewhere classified: Secondary | ICD-10-CM | POA: Diagnosis not present

## 2020-07-20 IMAGING — MR MR ABDOMEN WO/W CM
8 of 17 series · 23 of 48 positions shown · IV contrast (gadavist)
Comparison: [DATE]

CLINICAL DATA: Follow-up cystic pancreatic lesion

EXAM:
MRI ABDOMEN WITHOUT AND WITH CONTRAST
TECHNIQUE: Multiplanar multisequence MR imaging of the abdomen was performed
both before and after the administration of intravenous contrast.
CONTRAST:  7.5mL GADAVIST GADOBUTROL 1 MMOL/ML IV SOLN

[Series 5: T2 fat-sat · axial · 6.0mm · 1.48mm/px · z∈[-44,+174]mm · 2 of 30 slices shown]
[im 1/30]
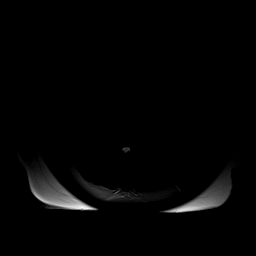
[im 30/30]
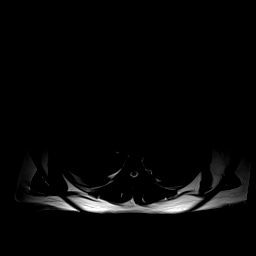

[Series 6: in + out · axial · 6.0mm · 0.74mm/px · z∈[-58,+175]mm · 4 of 64 slices shown]
[im 1/64]
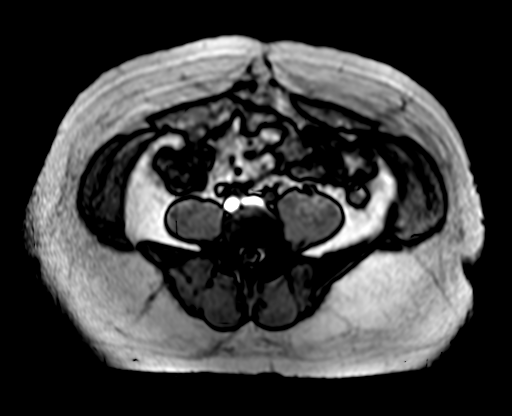
[im 22/64]
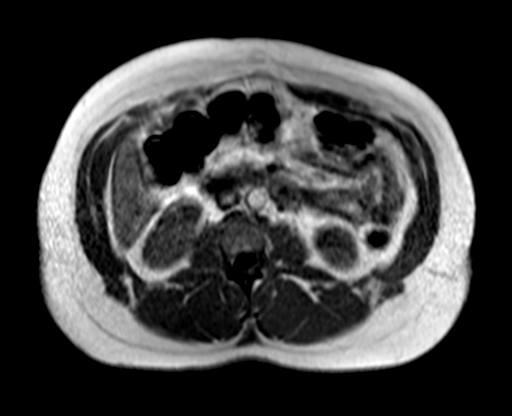
[im 43/64]
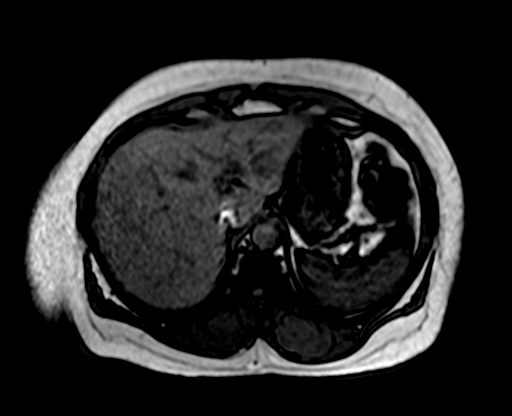
[im 64/64]
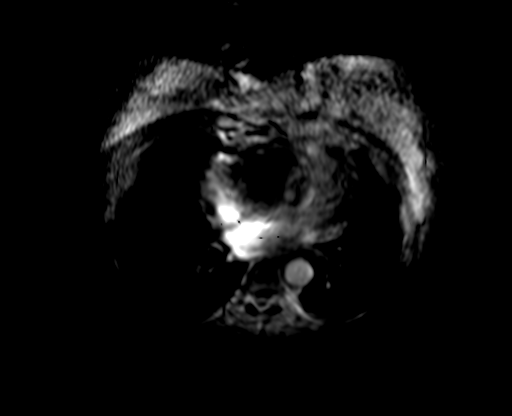

[Series 7: ep2d_diff_b50_500_800 free breathing · axial · 6.0mm · 1.98mm/px · z∈[-44,+174]mm · 5 of 90 slices shown]
[im 1/90]
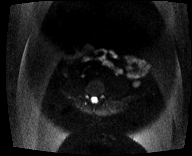
[im 23/90]
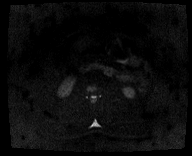
[im 45/90]
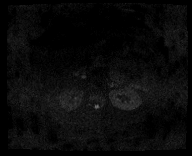
[im 67/90]
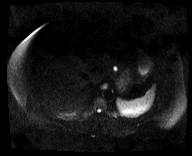
[im 90/90]
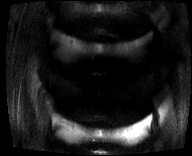

[Series 8: ep2d_diff_b50_500_800 free breathing_adc · axial · 6.0mm · 1.98mm/px · z∈[-44,+174]mm · 2 of 30 slices shown]
[im 1/30]
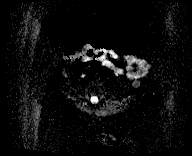
[im 30/30]
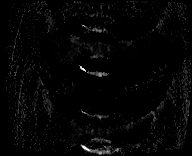

[Series 10: DWI · axial · 6.0mm · 0.74mm/px · z∈[-80,+197]mm · 3 of 38 slices shown]
[im 1/38]
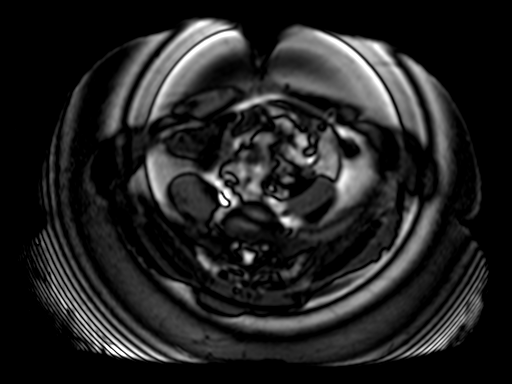
[im 19/38]
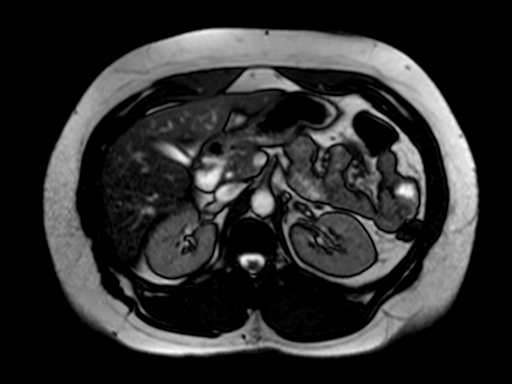
[im 38/38]
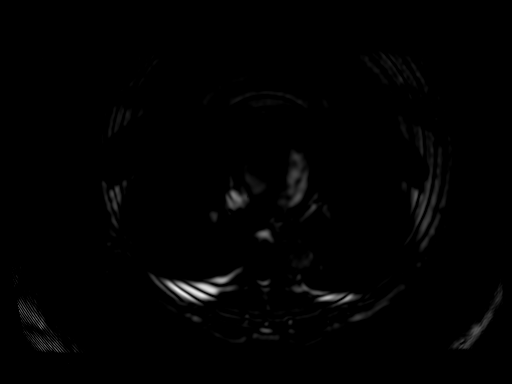

[Series 22: T2 · axial · 6.0mm · 1.48mm/px · 1 of 30 slices shown]
[im 1/30]
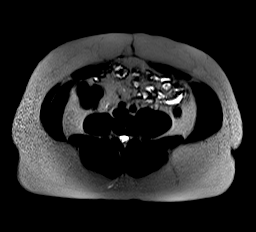

[Series 23: T1 dynamic fat-sat post-contrast · axial · delayed · 4.0mm · 0.78mm/px · z∈[-95,+189]mm · 3 of 72 slices shown]
[im 1/72]
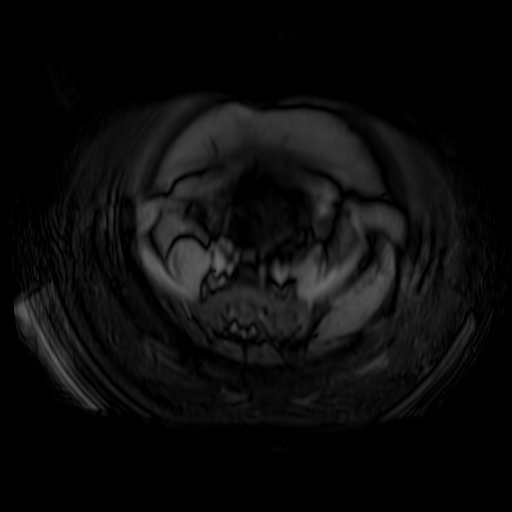
[im 36/72]
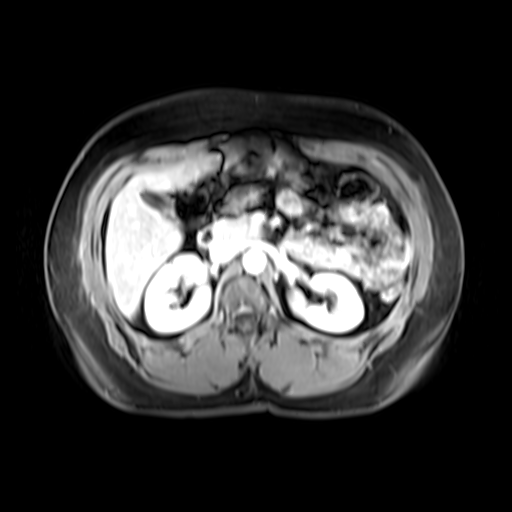
[im 72/72]
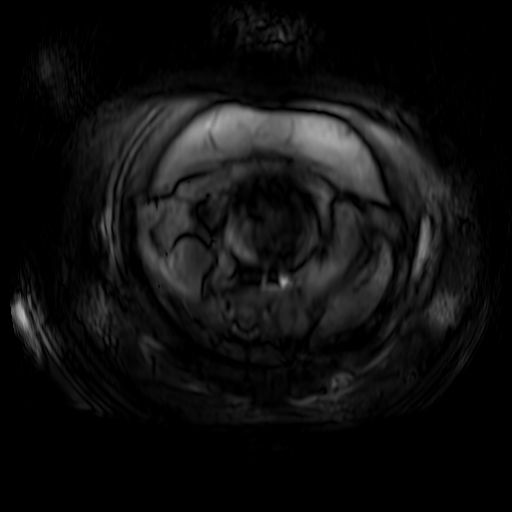

[Series 5002: sub_delay · axial · 4.0mm · 0.78mm/px · z∈[-95,+189]mm · 3 of 72 slices shown]
[im 1/72]
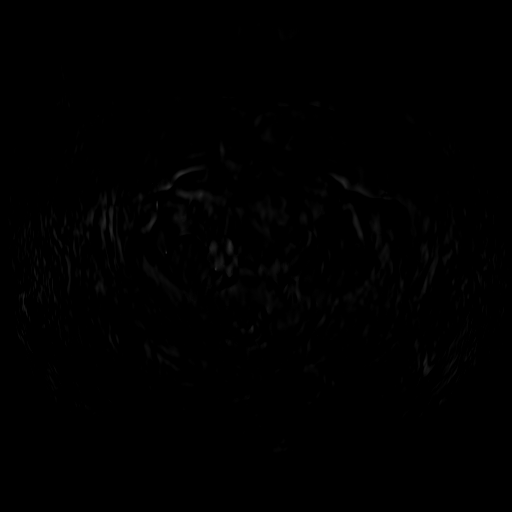
[im 36/72]
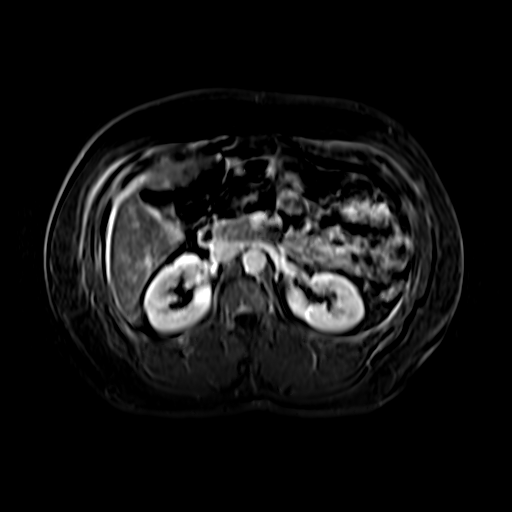
[im 72/72]
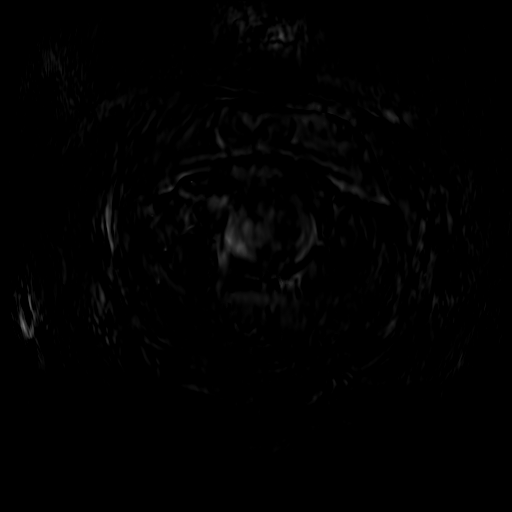

[23 of 48 positions shown; findings below may reference images not displayed]

FINDINGS: Lower chest: Unchanged trace bilateral pleural effusions.

Hepatobiliary: Hepatic steatosis. No mass or other parenchymal
abnormality identified. No biliary ductal dilatation.

Pancreas: Unchanged bilobed fluid signal lesion of the pancreatic
body measuring 1.6 x 1.2 cm (series 5, image 13). No associated
contrast enhancement. No pancreatic ductal dilatation. No solid
mass, inflammatory changes, or other parenchymal abnormality
identified.

Spleen:  Within normal limits in size and appearance.

Adrenals/Urinary Tract: No masses identified. No evidence of
hydronephrosis.

Stomach/Bowel: Visualized portions within the abdomen are
unremarkable.

Vascular/Lymphatic: No pathologically enlarged lymph nodes
identified. No abdominal aortic aneurysm demonstrated.

Other:  None.

Musculoskeletal: No suspicious bone lesions identified.
IMPRESSION: 1. Unchanged bilobed fluid signal lesion of the pancreatic body
measuring 1.6 x 1.2 cm. No associated contrast enhancement. This is
remain most consistent with a side branch IPMN or pancreatic
pseudocyst. Recommend additional follow-up in multiphasic contrast
enhanced MRI 6 months. This recommendation follows ACR consensus
guidelines: Management of Incidental Pancreatic Cysts: A White Paper
of the ACR Incidental Findings Committee. [HOSPITAL]
2. Hepatic steatosis.
3. Unchanged trace bilateral pleural effusions.

## 2020-07-20 MED ORDER — GADOBUTROL 1 MMOL/ML IV SOLN
7.5000 mL | Freq: Once | INTRAVENOUS | Status: AC | PRN
  Administered 2020-07-20: 7.5 mL via INTRAVENOUS

## 2020-07-21 NOTE — Progress Notes (Signed)
Subjective:   Patient ID: Abigail Campos, female   DOB: 49 y.o.   MRN: [id number]   HPI 49 year old female presents the office today for concerns of left toe bleeding.  She said about 2 weeks ago she did not have any injury but she does notice that it was bleeding.  She was seen in the emergency department for this and x-rays were performed which did not reveal any fracture.  She states that she has a history of a stroke which is resulted in left leg weakness, numbness.  She has not had any open sores.  No injury that she reports.   Review of Systems  All other systems reviewed and are negative.  Past Medical History:  Diagnosis Date  . Anxiety and depression   . Essential hypertension 10/19/2007   03-2010: metoprolol changed to bystolic (was not feeling well on it, no specific allergy or reaction)    . Hemiplegia ([place])   . Hyperlipemia   . Hypertension   . Migraine   . Neuromuscular disorder ([place])    left sided hemiplegia - has brace for l leg and walks with a cane  . Ovarian cancer ([place])   . Sleep apnea    not wearing c-pap yet  . Stroke [facility]) 2017    Past Surgical History:  Procedure Laterality Date  . ABDOMINAL HYSTERECTOMY  11-14-07   no oophorectomy per surgical report  . CESAREAN SECTION     [id number]  . INGUINAL HERNIA REPAIR     2004     Current Outpatient Medications:  .  atorvastatin (LIPITOR) 20 MG tablet, Take 1 tablet (20 mg total) by mouth daily at 6 PM., Disp: 90 tablet, Rfl: 3 .  baclofen (LIORESAL) 20 MG tablet, TAKE 1 TABLET BY MOUTH THREE TIMES A DAY, Disp: 90 tablet, Rfl: 1 .  buPROPion (WELLBUTRIN) 100 MG tablet, Take 1 tablet (100 mg total) by mouth 2 (two) times daily., Disp: 180 tablet, Rfl: 3 .  carvedilol (COREG) 6.25 MG tablet, Take 1 tablet (6.25 mg total) by mouth 2 (two) times daily with a meal., Disp: 60 tablet, Rfl: 6 .  cephALEXin (KEFLEX) 500 MG capsule, Take 1 capsule (500 mg total) by mouth 3 (three) times daily., Disp: 21 capsule, Rfl:  0 .  clopidogrel (PLAVIX) 75 MG tablet, Take 1 tablet (75 mg total) by mouth daily., Disp: 90 tablet, Rfl: 3 .  Cyanocobalamin (VITAMIN B 12 PO), Take 1 tablet by mouth daily., Disp: , Rfl:  .  escitalopram (LEXAPRO) 20 MG tablet, Take 1 tablet (20 mg total) by mouth daily., Disp: 90 tablet, Rfl: 1 .  furosemide (LASIX) 20 MG tablet, Take 1 tablet (20 mg total) by mouth daily., Disp: 90 tablet, Rfl: 0 .  gabapentin (NEURONTIN) 300 MG capsule, Take 1 capsule (300 mg total) by mouth 2 (two) times daily., Disp: 60 capsule, Rfl: 5 .  lisinopril (ZESTRIL) 2.5 MG tablet, Take 1 tablet (2.5 mg total) by mouth daily., Disp: 90 tablet, Rfl: 0 .  traZODone (DESYREL) 100 MG tablet, Take 1 tablet (100 mg total) by mouth at bedtime as needed for sleep., Disp: 90 tablet, Rfl: 1  Allergies  Allergen Reactions  . Bee Venom Anaphylaxis  . Peanut-Containing Drug Products Anaphylaxis  . Hydrocodone Hives    Generalize itching w/o rash  . Isovue [Iopamidol] Hives and Itching    Pt broke out with one facial hive.  Had itching on her right upper ant and posterior arm w/o evidence of hives.  Pt given water and 25mg  benadryl po.  We observed pt for 30 minutes before d/c.  J. Bohm    . Ambien [Zolpidem Tartrate] Other (See Comments)    forgetfulness  . Aspirin Swelling    Reports blisters w/ ASA but pt reports is ok w/ Motrin, Advil, naproxen  . Latex Hives          Objective:  Physical Exam  General: AAO x3, NAD  Dermatological: The distal aspect of left medial nail border is incurvation of the nail and there seems to be some localized edema to this area there is no active drainage or pus or any bleeding identified today but there does appear to be some peeling skin present around the toenail.  I think that she was having ingrown toenail which caused the bleeding previously.  She also showed me pictures from when she went to emergency department seem to be the toenail is causing the issue.  Vascular:  Dorsalis Pedis artery and Posterior Tibial artery pedal pulses are palpable bilateral with immedate capillary fill time. There is no pain with calf compression, swelling, warmth, erythema.   Neruologic: Sensation decreased left side  Musculoskeletal: No pain currently.       Assessment:   Ingrown toenail left medial hallux     Plan:  -Treatment options discussed including all alternatives, risks, and complications -Etiology of symptoms were discussed -Traumatic and seated asymptomatic ingrown toenails causing the issue.  Due to localized edema the distal aspect I did prescribe Keflex.  I performed a slant back procedure today.  Medial medial proximal nail avulsion.  Discussed washing with soap and water daily as well as antibiotic ointment. -Monitor for any clinical signs or symptoms of infection and directed to call the office immediately should any occur or go to the ER.  Return in about 2 weeks (around 08/01/2020). Trula Slade DPM

## 2020-07-22 ENCOUNTER — Encounter: Payer: Self-pay | Admitting: Internal Medicine

## 2020-08-02 ENCOUNTER — Other Ambulatory Visit: Payer: Self-pay | Admitting: Internal Medicine

## 2020-08-02 ENCOUNTER — Other Ambulatory Visit: Payer: Self-pay | Admitting: Physical Medicine & Rehabilitation

## 2020-08-05 ENCOUNTER — Encounter: Payer: Self-pay | Admitting: Internal Medicine

## 2020-08-06 ENCOUNTER — Other Ambulatory Visit: Payer: Self-pay | Admitting: Internal Medicine

## 2020-08-08 ENCOUNTER — Encounter: Payer: No Typology Code available for payment source | Admitting: Physical Medicine & Rehabilitation

## 2020-08-10 ENCOUNTER — Other Ambulatory Visit: Payer: Self-pay | Admitting: Internal Medicine

## 2020-08-20 ENCOUNTER — Ambulatory Visit: Payer: No Typology Code available for payment source | Admitting: Medical

## 2020-08-20 DIAGNOSIS — Z0289 Encounter for other administrative examinations: Secondary | ICD-10-CM

## 2020-08-30 ENCOUNTER — Observation Stay (HOSPITAL_BASED_OUTPATIENT_CLINIC_OR_DEPARTMENT_OTHER)
Admission: EM | Admit: 2020-08-30 | Discharge: 2020-08-31 | Disposition: A | Discharge status: Home / Self Care | Payer: No Typology Code available for payment source | Attending: Internal Medicine | Admitting: Internal Medicine

## 2020-08-30 ENCOUNTER — Encounter (HOSPITAL_BASED_OUTPATIENT_CLINIC_OR_DEPARTMENT_OTHER): Payer: Self-pay | Admitting: *Deleted

## 2020-08-30 ENCOUNTER — Emergency Department (HOSPITAL_BASED_OUTPATIENT_CLINIC_OR_DEPARTMENT_OTHER): Payer: No Typology Code available for payment source

## 2020-08-30 ENCOUNTER — Other Ambulatory Visit: Payer: Self-pay

## 2020-08-30 DIAGNOSIS — W19XXXA Unspecified fall, initial encounter: Secondary | ICD-10-CM

## 2020-08-30 DIAGNOSIS — I619 Nontraumatic intracerebral hemorrhage, unspecified: Secondary | ICD-10-CM | POA: Diagnosis present

## 2020-08-30 DIAGNOSIS — I10 Essential (primary) hypertension: Secondary | ICD-10-CM | POA: Insufficient documentation

## 2020-08-30 DIAGNOSIS — W1839XA Other fall on same level, initial encounter: Secondary | ICD-10-CM | POA: Diagnosis not present

## 2020-08-30 DIAGNOSIS — S4992XA Unspecified injury of left shoulder and upper arm, initial encounter: Secondary | ICD-10-CM | POA: Diagnosis not present

## 2020-08-30 DIAGNOSIS — Y92009 Unspecified place in unspecified non-institutional (private) residence as the place of occurrence of the external cause: Secondary | ICD-10-CM | POA: Diagnosis not present

## 2020-08-30 DIAGNOSIS — Z8543 Personal history of malignant neoplasm of ovary: Secondary | ICD-10-CM | POA: Insufficient documentation

## 2020-08-30 DIAGNOSIS — Z91040 Latex allergy status: Secondary | ICD-10-CM | POA: Insufficient documentation

## 2020-08-30 DIAGNOSIS — Z7901 Long term (current) use of anticoagulants: Secondary | ICD-10-CM | POA: Diagnosis not present

## 2020-08-30 DIAGNOSIS — E785 Hyperlipidemia, unspecified: Secondary | ICD-10-CM | POA: Diagnosis not present

## 2020-08-30 DIAGNOSIS — R29810 Facial weakness: Principal | ICD-10-CM

## 2020-08-30 DIAGNOSIS — Z79899 Other long term (current) drug therapy: Secondary | ICD-10-CM | POA: Diagnosis not present

## 2020-08-30 DIAGNOSIS — R29898 Other symptoms and signs involving the musculoskeletal system: Secondary | ICD-10-CM | POA: Diagnosis not present

## 2020-08-30 DIAGNOSIS — M79602 Pain in left arm: Secondary | ICD-10-CM | POA: Insufficient documentation

## 2020-08-30 DIAGNOSIS — H02402 Unspecified ptosis of left eyelid: Secondary | ICD-10-CM | POA: Diagnosis present

## 2020-08-30 DIAGNOSIS — Z8673 Personal history of transient ischemic attack (TIA), and cerebral infarction without residual deficits: Secondary | ICD-10-CM | POA: Insufficient documentation

## 2020-08-30 DIAGNOSIS — S299XXA Unspecified injury of thorax, initial encounter: Secondary | ICD-10-CM | POA: Diagnosis not present

## 2020-08-30 DIAGNOSIS — G8114 Spastic hemiplegia affecting left nondominant side: Secondary | ICD-10-CM | POA: Diagnosis present

## 2020-08-30 DIAGNOSIS — Y9389 Activity, other specified: Secondary | ICD-10-CM | POA: Insufficient documentation

## 2020-08-30 DIAGNOSIS — I69359 Hemiplegia and hemiparesis following cerebral infarction affecting unspecified side: Secondary | ICD-10-CM

## 2020-08-30 DIAGNOSIS — Z20822 Contact with and (suspected) exposure to covid-19: Secondary | ICD-10-CM | POA: Diagnosis not present

## 2020-08-30 LAB — CBC WITH DIFFERENTIAL/PLATELET
Abs Immature Granulocytes: 0.01 10*3/uL (ref 0.00–0.07)
Basophils Absolute: 0 10*3/uL (ref 0.0–0.1)
Basophils Relative: 0 %
Eosinophils Absolute: 0.1 10*3/uL (ref 0.0–0.5)
Eosinophils Relative: 1 %
HCT: 39.8 % (ref 36.0–46.0)
Hemoglobin: 12.4 g/dL (ref 12.0–15.0)
Immature Granulocytes: 0 %
Lymphocytes Relative: 39 %
Lymphs Abs: 1.9 10*3/uL (ref 0.7–4.0)
MCH: 30.4 pg (ref 26.0–34.0)
MCHC: 31.2 g/dL (ref 30.0–36.0)
MCV: 97.5 fL (ref 80.0–100.0)
Monocytes Absolute: 0.4 10*3/uL (ref 0.1–1.0)
Monocytes Relative: 9 %
Neutro Abs: 2.3 10*3/uL (ref 1.7–7.7)
Neutrophils Relative %: 51 %
Platelets: 186 10*3/uL (ref 150–400)
RBC: 4.08 MIL/uL (ref 3.87–5.11)
RDW: 13 % (ref 11.5–15.5)
WBC: 4.7 10*3/uL (ref 4.0–10.5)
nRBC: 0 % (ref 0.0–0.2)

## 2020-08-30 LAB — BASIC METABOLIC PANEL
Anion gap: 8 (ref 5–15)
BUN: 11 mg/dL (ref 6–20)
CO2: 29 mmol/L (ref 22–32)
Calcium: 9 mg/dL (ref 8.9–10.3)
Chloride: 100 mmol/L (ref 98–111)
Creatinine, Ser: 1.09 mg/dL — ABNORMAL HIGH (ref 0.44–1.00)
GFR calc Af Amer: >60 mL/min (ref >60)
GFR calc non Af Amer: >60 mL/min (ref >60)
Glucose, Bld: 89 mg/dL (ref 70–99)
Potassium: 3.8 mmol/L (ref 3.5–5.1)
Sodium: 137 mmol/L (ref 135–145)

## 2020-08-30 LAB — CBG MONITORING, ED: Glucose-Capillary: 82 mg/dL (ref 70–99)

## 2020-08-30 LAB — SARS CORONAVIRUS 2 BY RT PCR (HOSPITAL ORDER, PERFORMED IN ~~LOC~~ HOSPITAL LAB): SARS Coronavirus 2: NEGATIVE

## 2020-08-30 IMAGING — CT CT HEAD W/O CM
3 series · 15 of 47 positions shown, 18 images · non-contrast
Comparison: Prior head CT examinations [DATE] and earlier.

CLINICAL DATA: Neuro deficit, acute, stroke suspected. Additional
provided: Fall last night, left eye droop this morning, history of
stroke 4 years ago with left-sided weakness.

EXAM:
CT HEAD WITHOUT CONTRAST
TECHNIQUE: Contiguous axial images were obtained from the base of the skull
through the vertex without intravenous contrast.

[Series 2: head wo · axial · 0.49mm/px · z∈[-153,-28]mm · 9 of 30 slices shown, 12 images]
[im 3/30  brain]
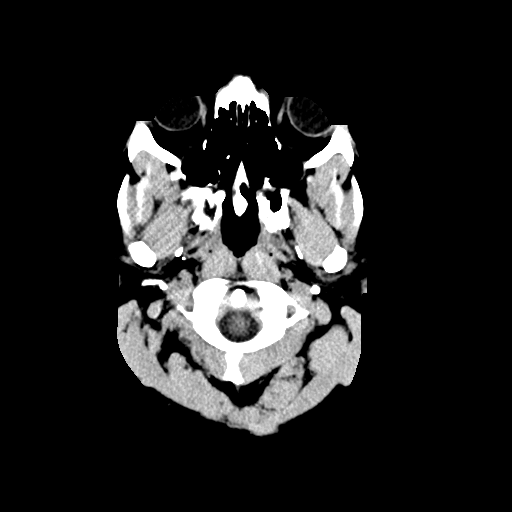
[im 3/30  bone]
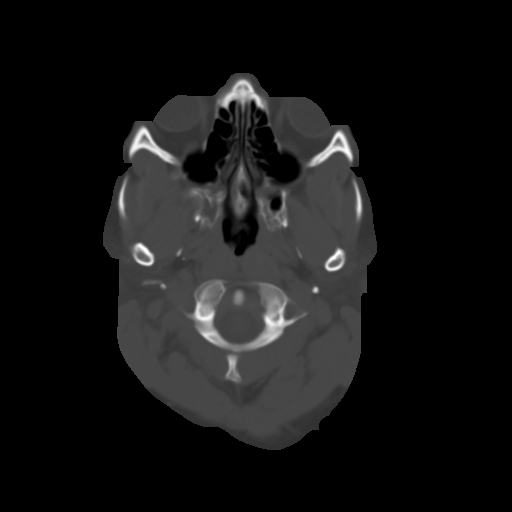
[im 6/30  brain]
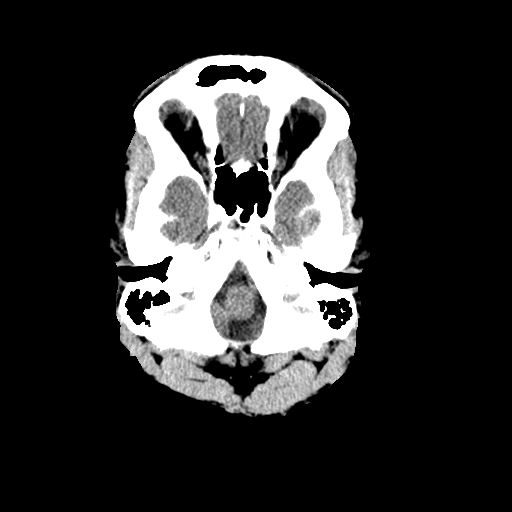
[im 9/30  brain]
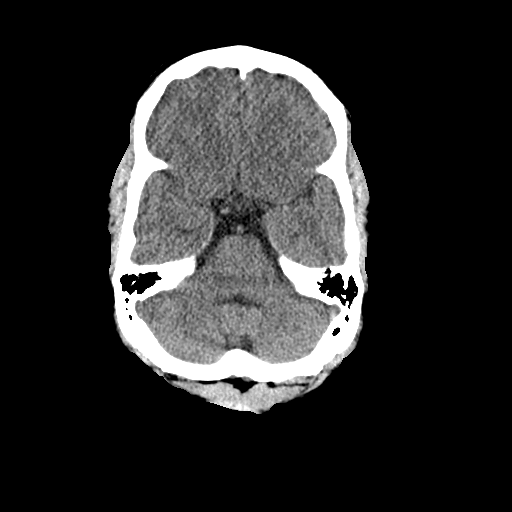
[im 12/30  brain]
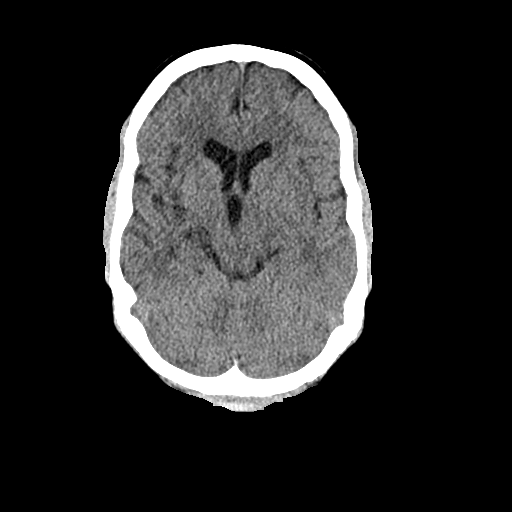
[im 16/30  brain]
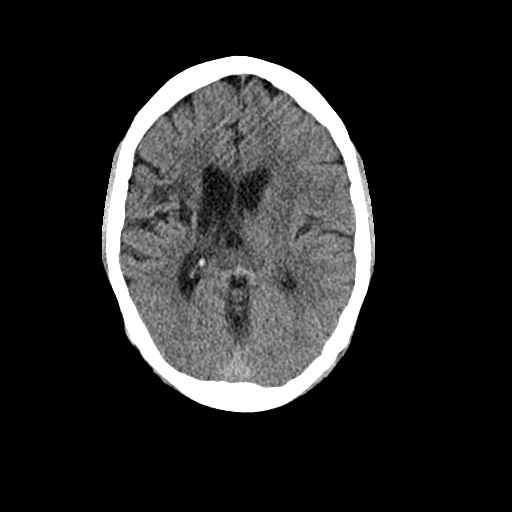
[im 16/30  bone]
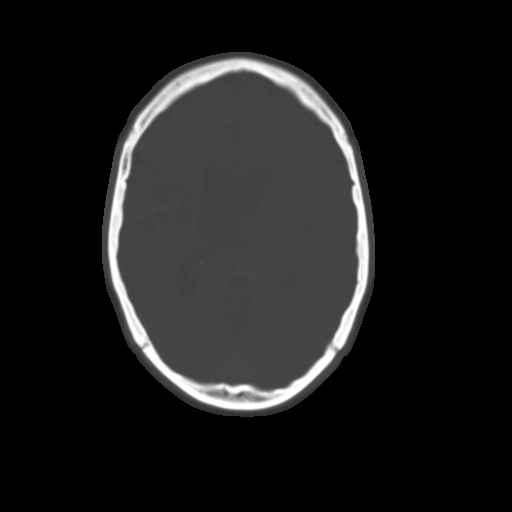
[im 19/30  brain]
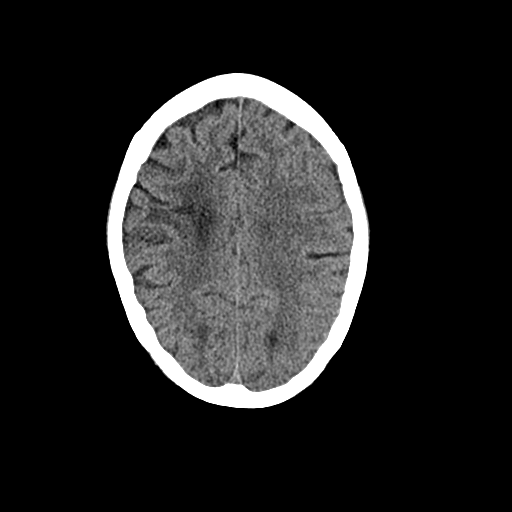
[im 22/30  brain]
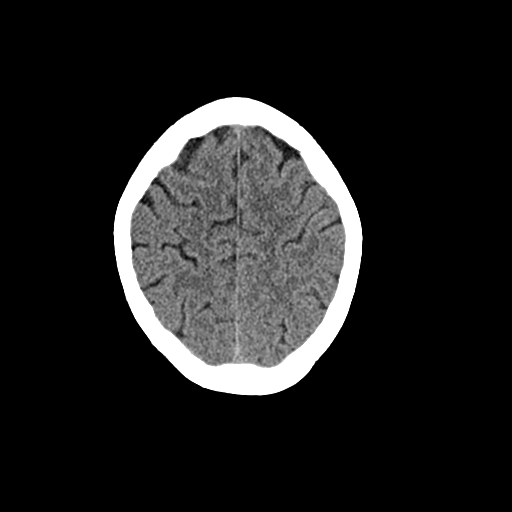
[im 25/30  brain]
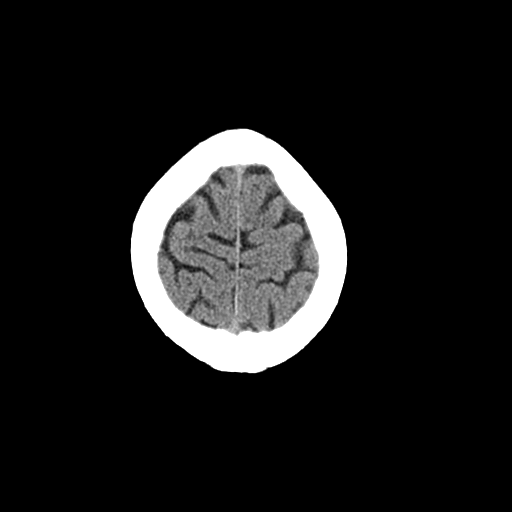
[im 28/30  brain]
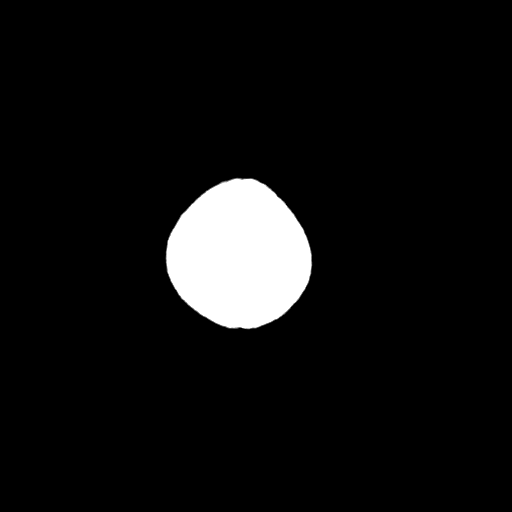
[im 28/30  bone]
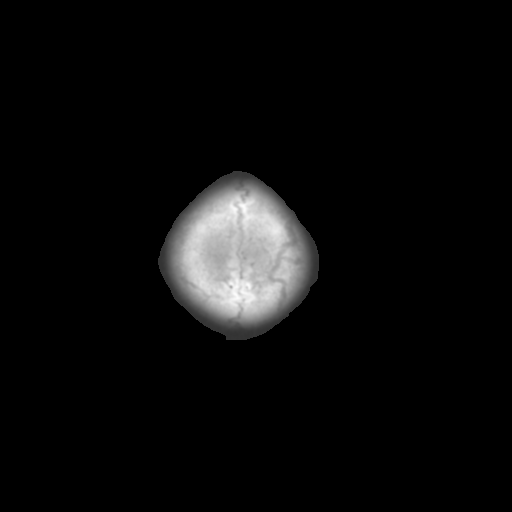

[Series 4: cor soft · coronal · 0.32mm/px · 3 of 67 slices shown]
[im 23/67  brain]
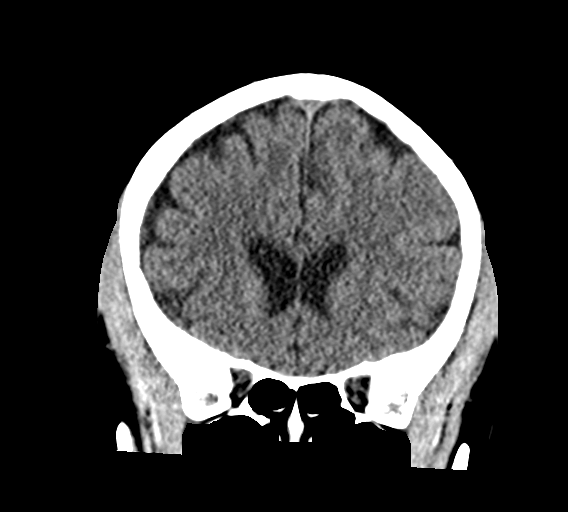
[im 30/67  brain]
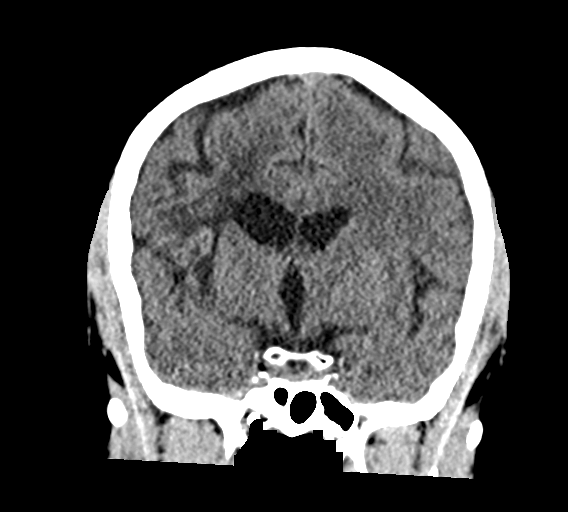
[im 37/67  brain]
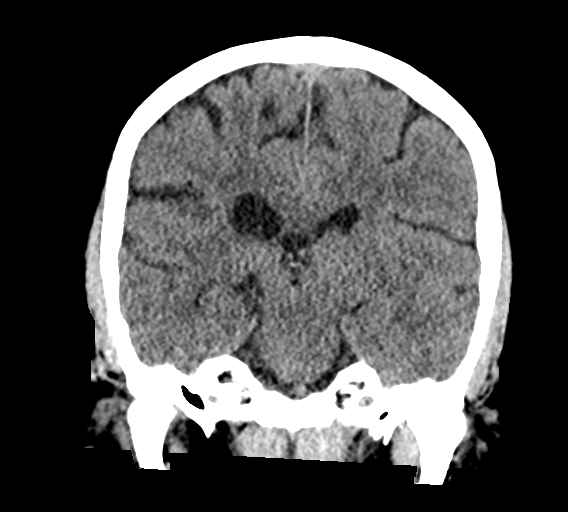

[Series 5: sag soft · sagittal · 0.32mm/px · 3 of 62 slices shown]
[im 21/62  brain]
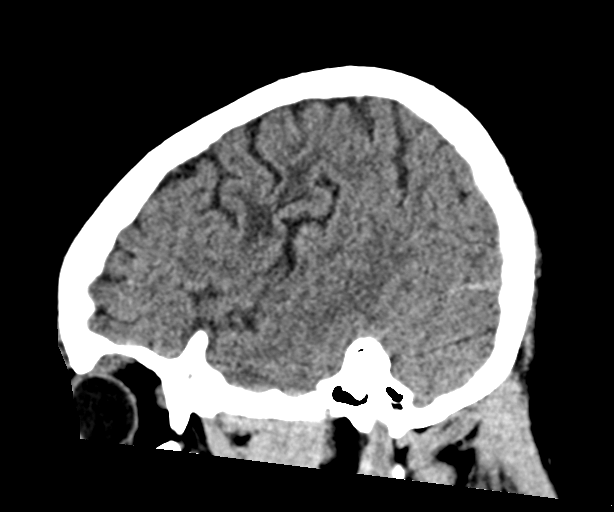
[im 31/62  brain]
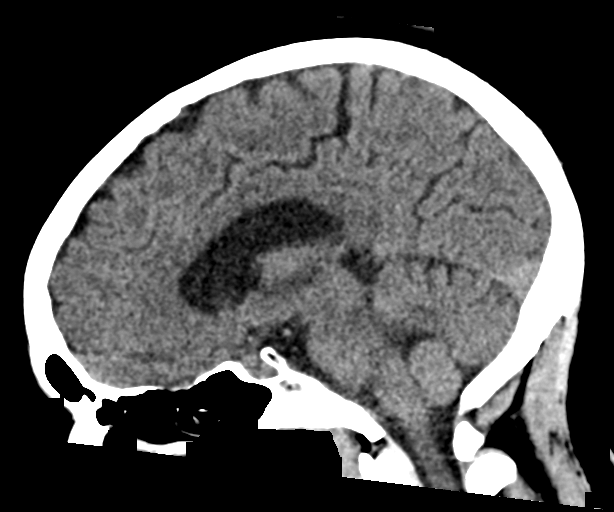
[im 41/62  brain]
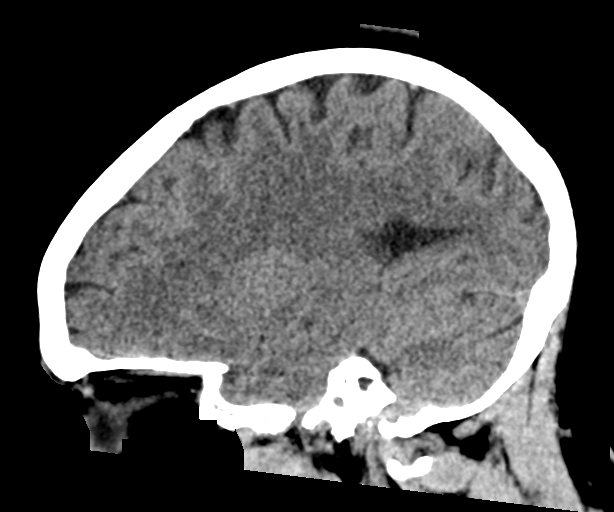

[15 of 47 positions shown; findings below may reference images not displayed]

FINDINGS: Brain:

Redemonstrated chronic encephalomalacia within the right
frontoparietal lobes, right basal ganglia and right insula at site
of prior hemorrhage.

There is no acute intracranial hemorrhage.

No acute demarcated cortical infarct is identified.

No extra-axial fluid collection.

No evidence of intracranial mass.

No midline shift.

Vascular: No hyperdense vessel.  Atherosclerotic calcifications.

Skull: Normal. Negative for fracture or focal lesion.

Sinuses/Orbits: Visualized orbits show no acute finding. Mild
ethmoid sinus mucosal thickening. No significant mastoid effusion.
IMPRESSION: No CT evidence of acute intracranial abnormality.

Redemonstrated chronic encephalomalacia within the right
frontoparietal lobes, right basal ganglia and right insula at site
of prior hemorrhage.

Mild ethmoid sinus mucosal thickening.

## 2020-08-30 IMAGING — DX DG HUMERUS 2V *L*
2 series · 2 of 2 positions shown · non-contrast
Comparison: None.

CLINICAL DATA: Status post fall.

EXAM:
LEFT HUMERUS - 2+ VIEW

[humerus ap]
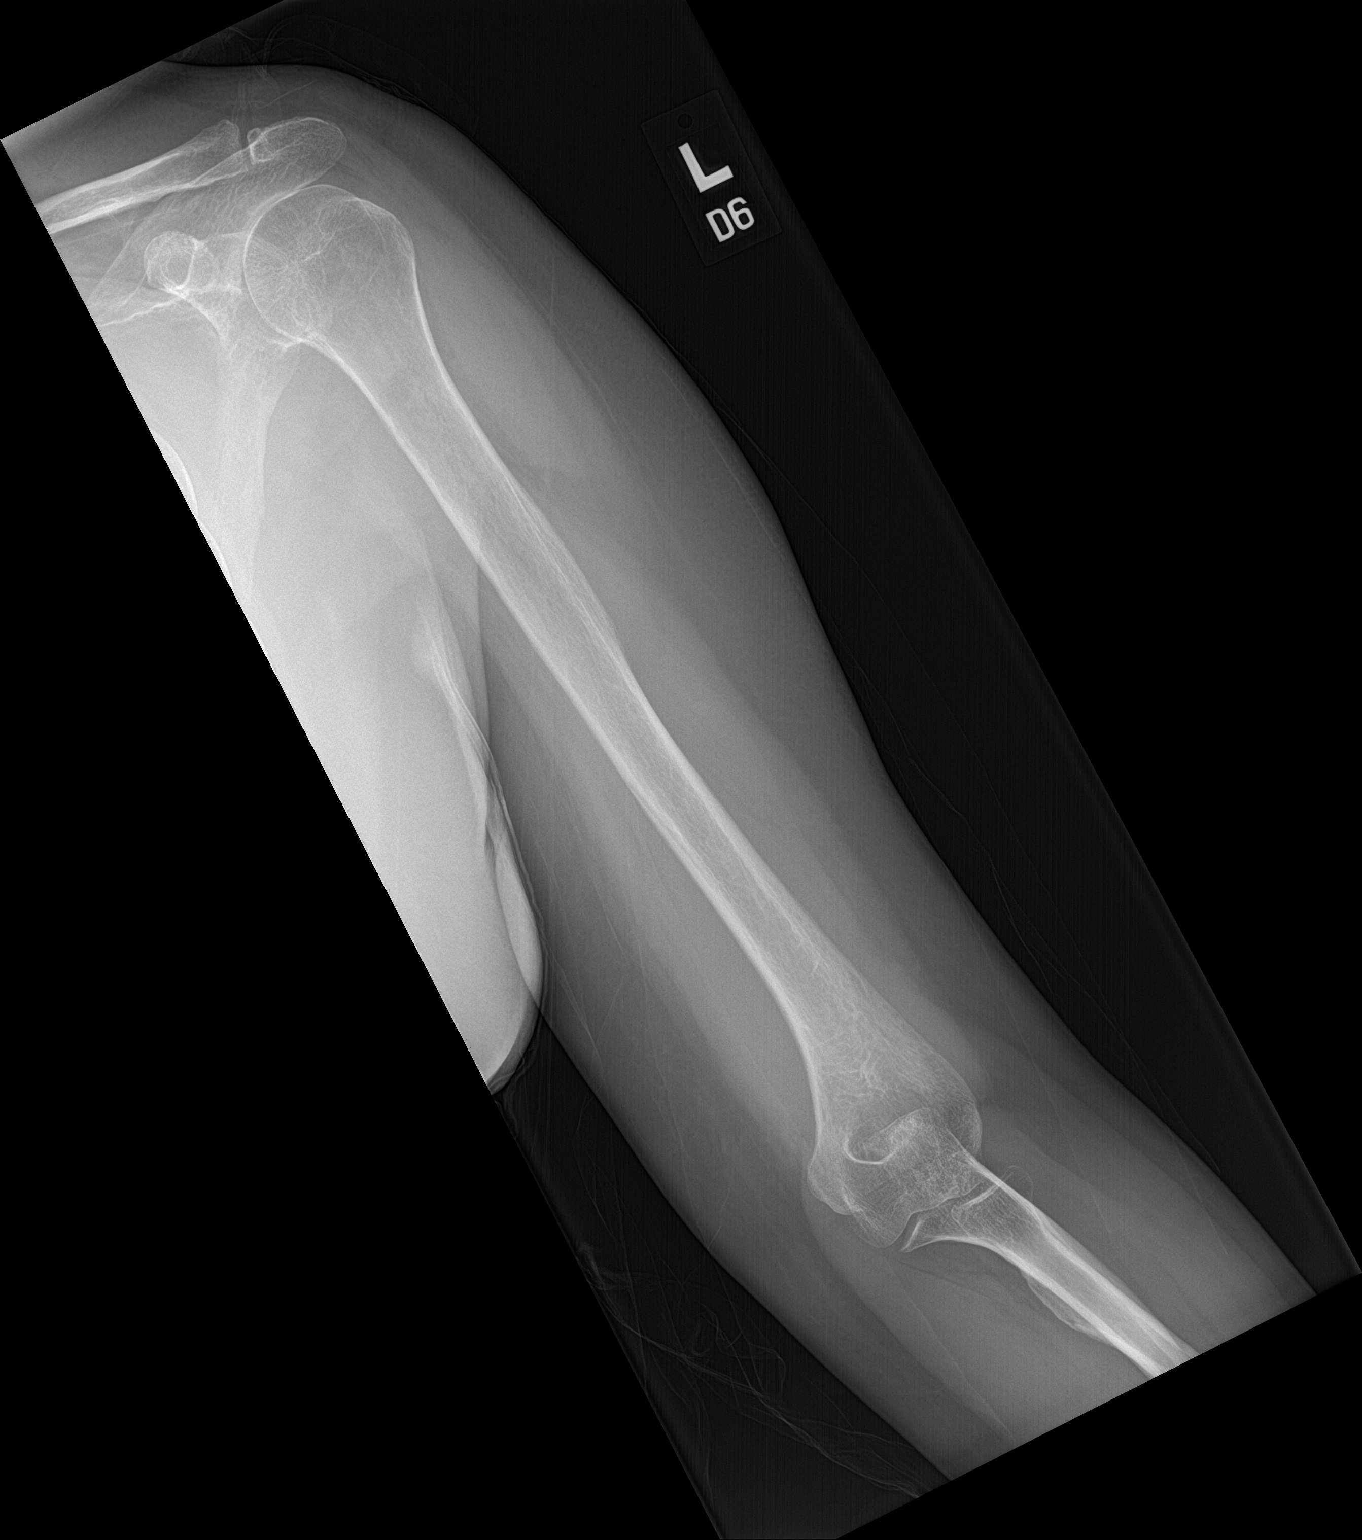

[humerus lat]
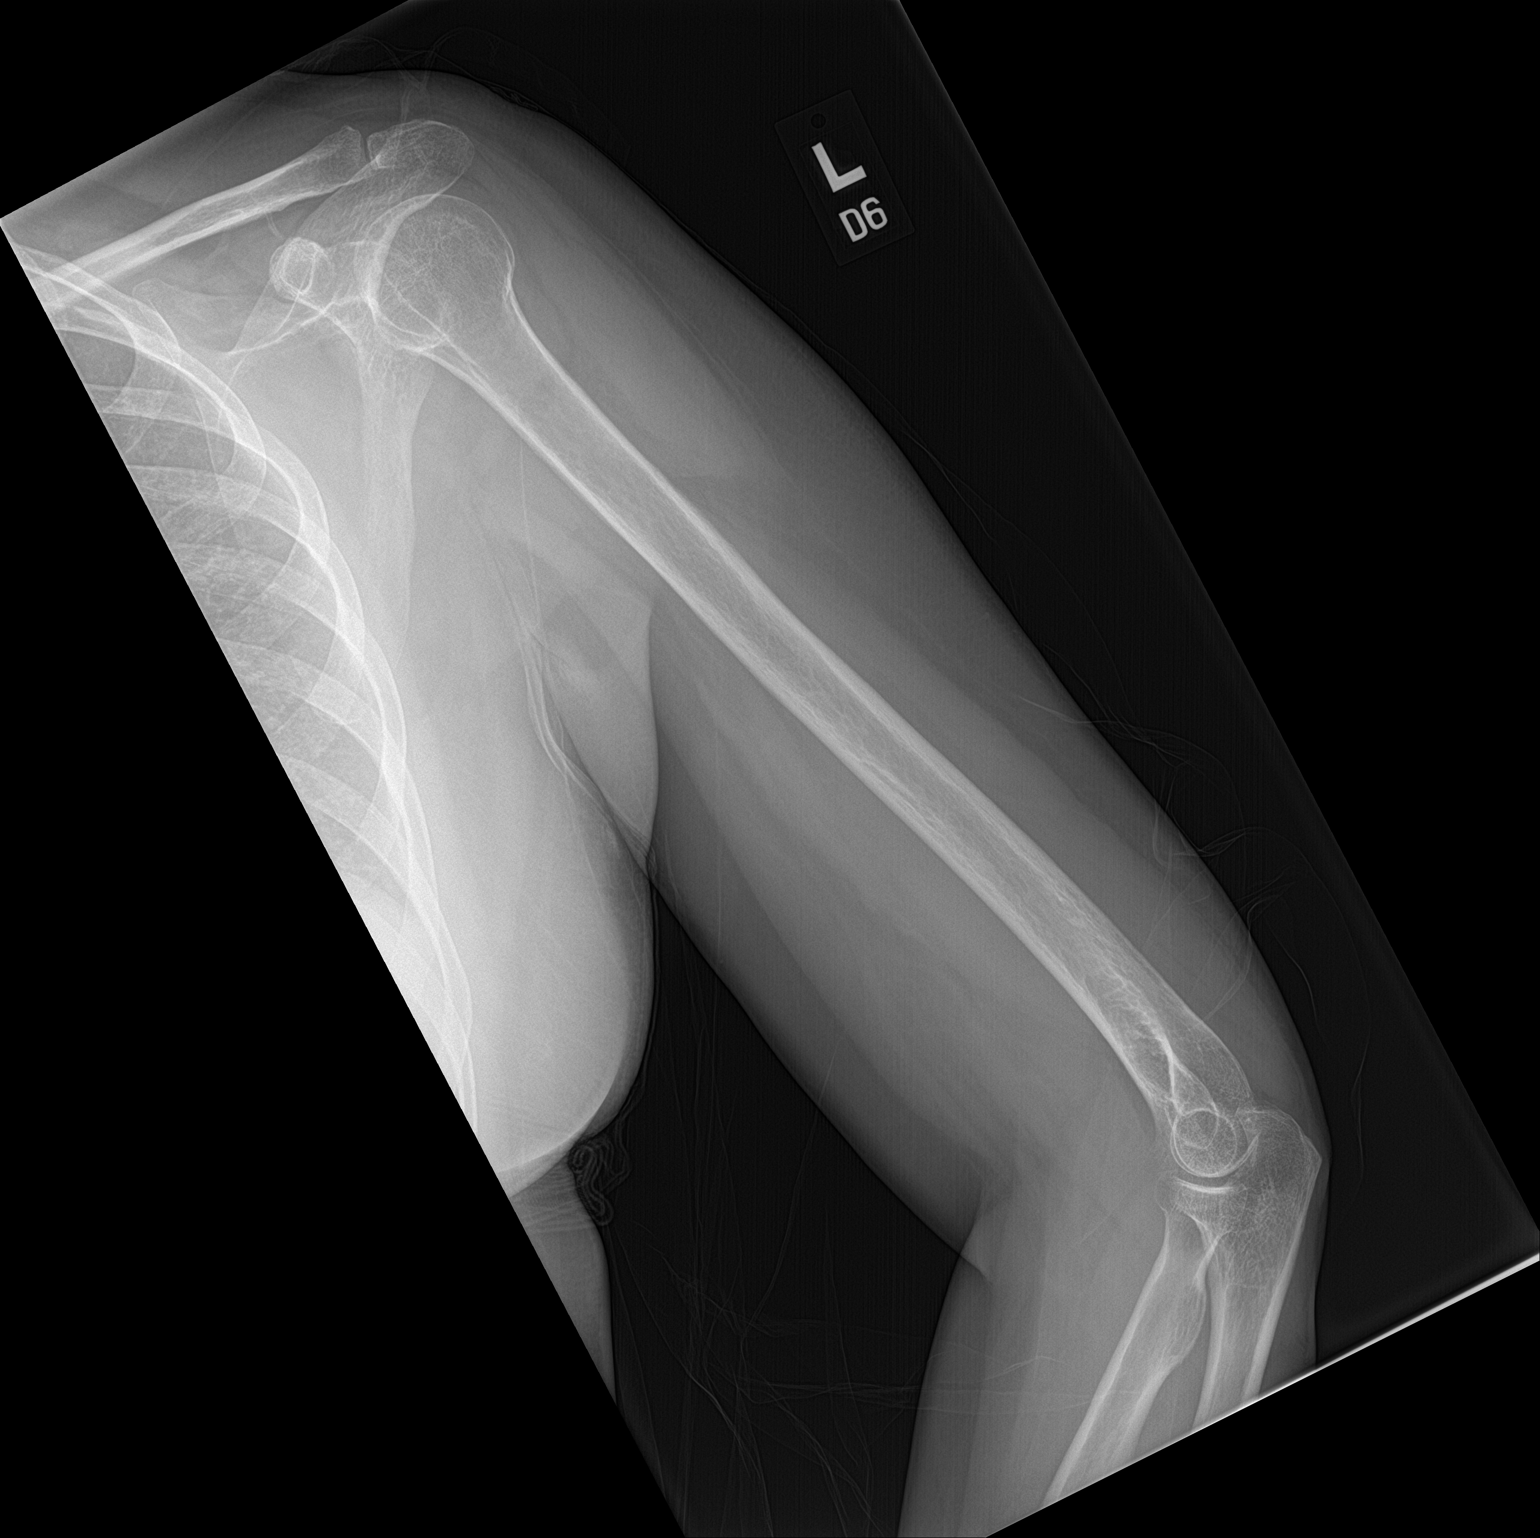

[2 of 2 positions shown; findings below may reference images not displayed]

FINDINGS: There is no evidence of fracture or other focal bone lesions. Soft
tissues are unremarkable.
IMPRESSION: Negative.

## 2020-08-30 IMAGING — DX DG RIBS W/ CHEST 3+V*L*
3 series · 3 of 3 positions shown · non-contrast
Comparison: [DATE]

CLINICAL DATA: Status post fall.

EXAM:
LEFT RIBS AND CHEST - 3+ VIEW

[chest pa]
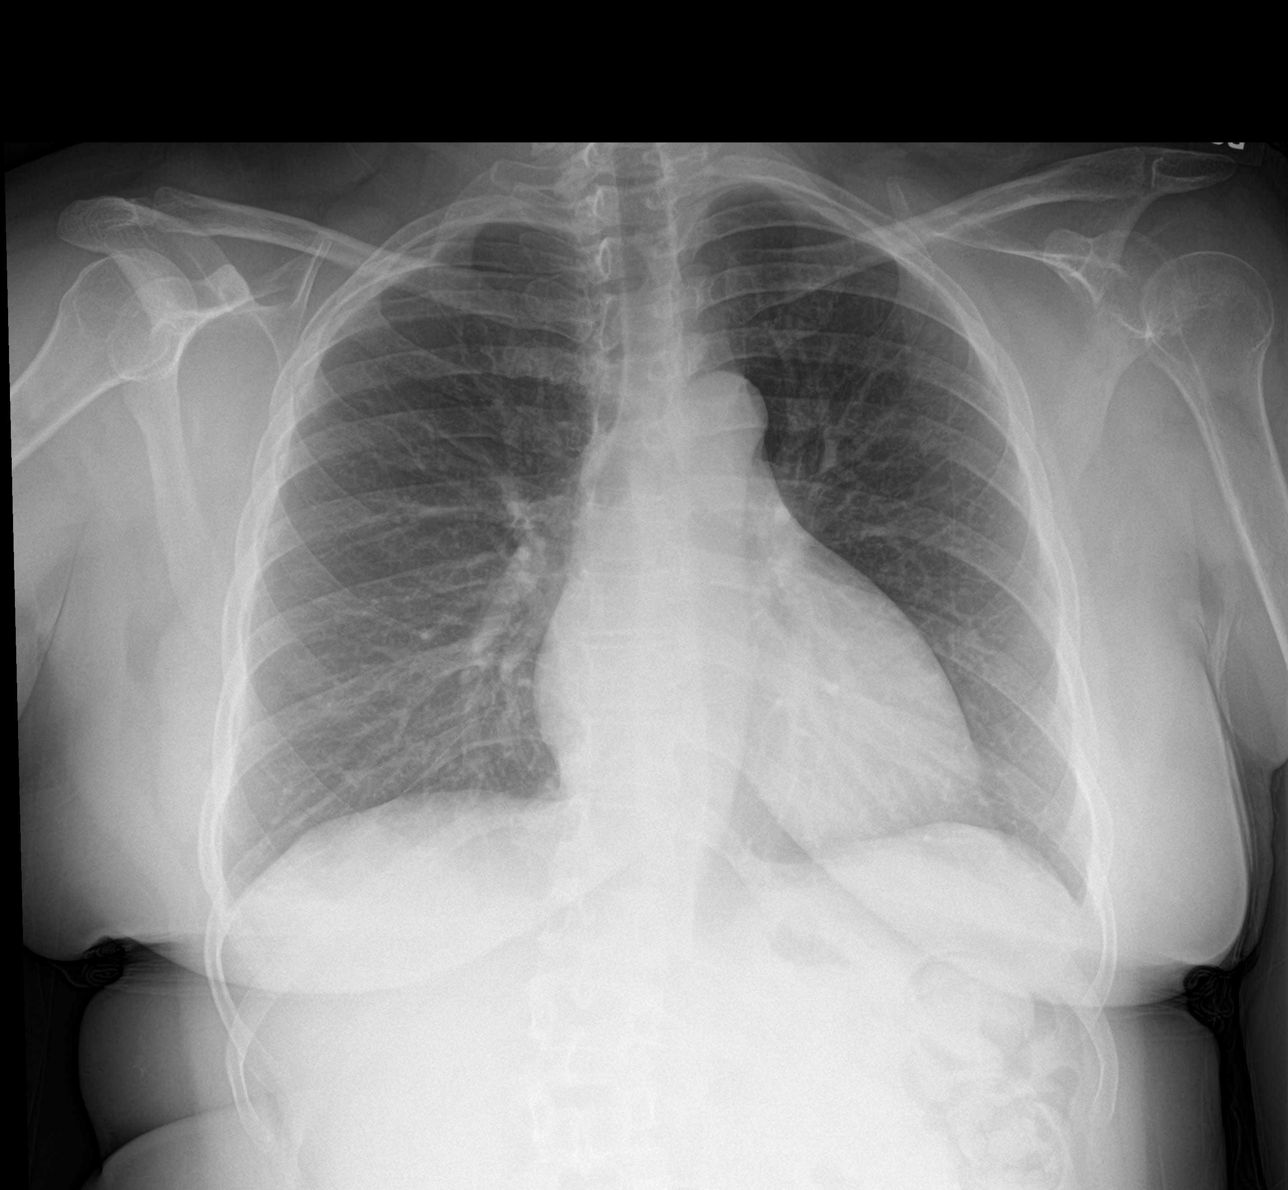

[rib pa]
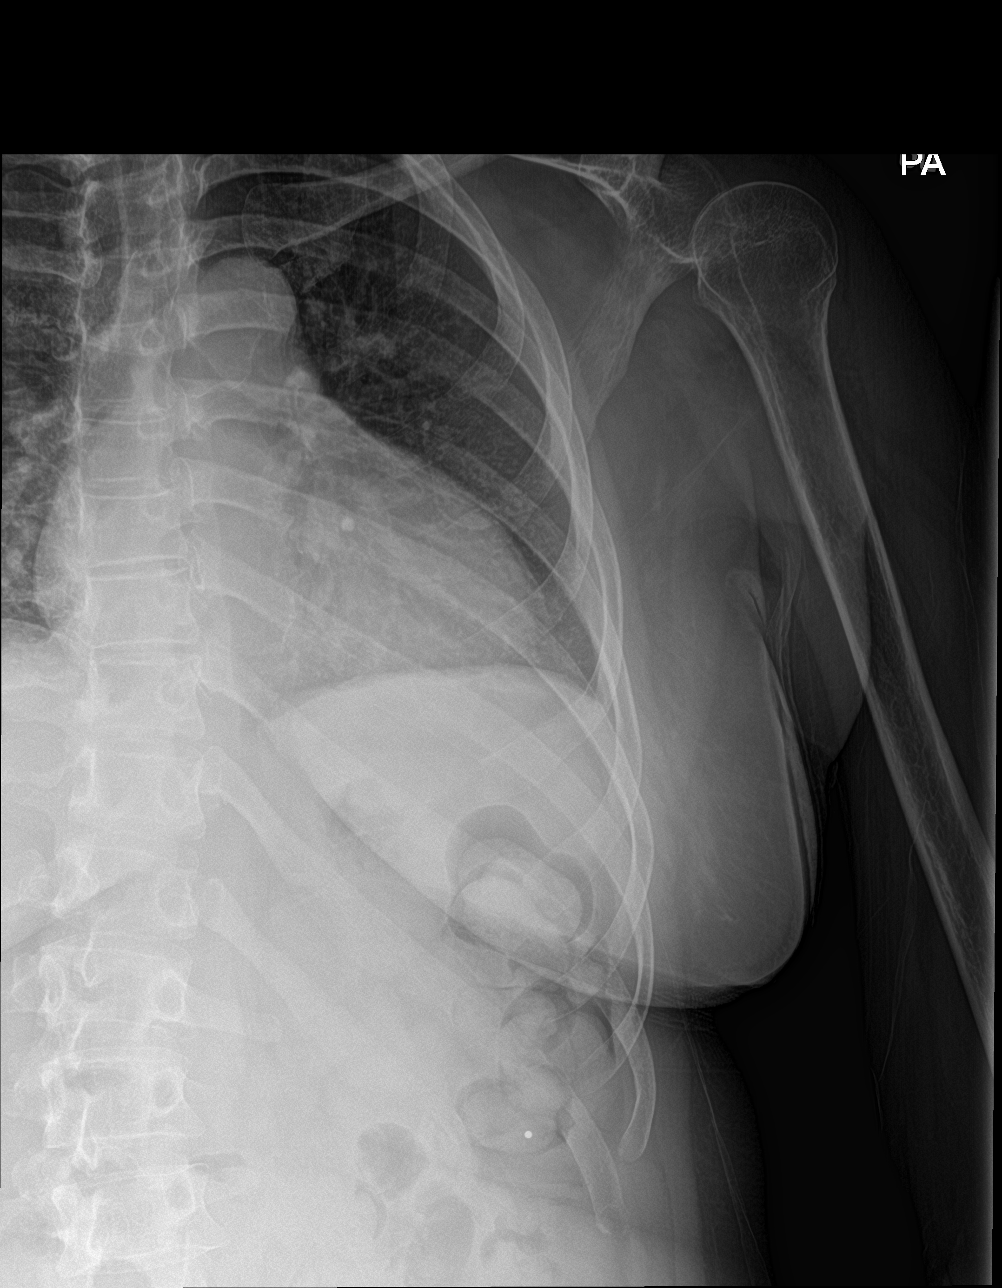

[rib pa obl]
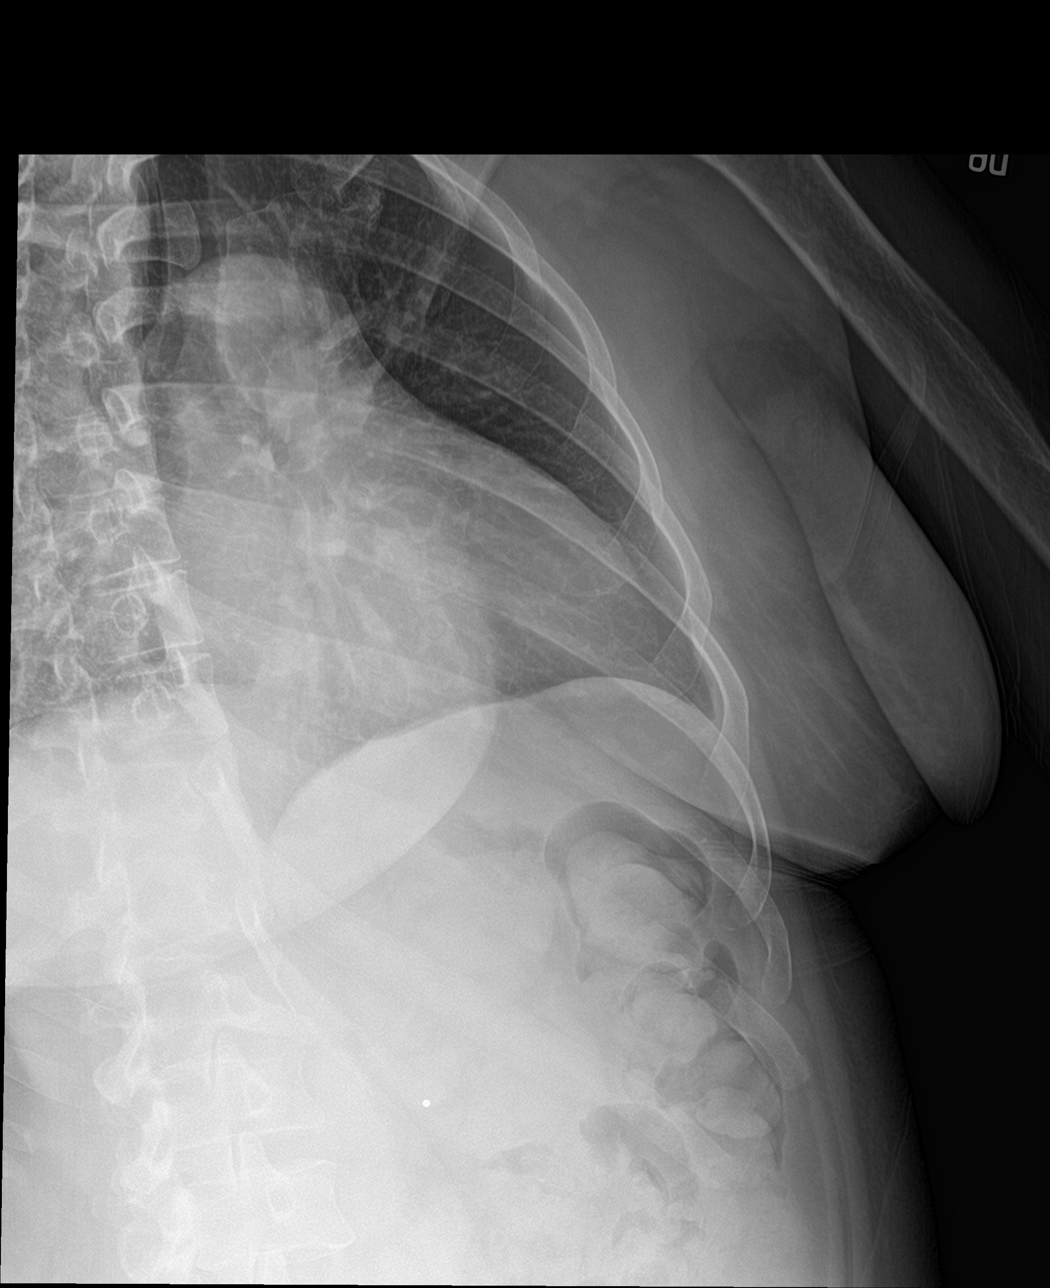

[3 of 3 positions shown; findings below may reference images not displayed]

FINDINGS: A radiopaque marker was placed at the site of the patient's pain. No
fracture or other bone lesions are seen involving the ribs. There is
no evidence of pneumothorax or pleural effusion. Both lungs are
clear. Heart size and mediastinal contours are within normal limits.
IMPRESSION: Negative.

## 2020-08-30 MED ORDER — BUPROPION HCL 100 MG PO TABS
100.0000 mg | ORAL_TABLET | Freq: Two times a day (BID) | ORAL | Status: DC
  Administered 2020-08-31 (×2): 100 mg via ORAL
  Filled 2020-08-30 (×4): qty 1

## 2020-08-30 MED ORDER — TRAZODONE HCL 100 MG PO TABS
100.0000 mg | ORAL_TABLET | Freq: Every evening | ORAL | Status: DC | PRN
  Administered 2020-08-30: 100 mg via ORAL
  Filled 2020-08-30: qty 1

## 2020-08-30 MED ORDER — ACETAMINOPHEN 160 MG/5ML PO SOLN: 650.0000 mg | ORAL | Status: DC | PRN

## 2020-08-30 MED ORDER — FUROSEMIDE 20 MG PO TABS
20.0000 mg | ORAL_TABLET | Freq: Every day | ORAL | Status: DC
  Administered 2020-08-31: 20 mg via ORAL
  Filled 2020-08-30: qty 1

## 2020-08-30 MED ORDER — GABAPENTIN 300 MG PO CAPS
300.0000 mg | ORAL_CAPSULE | Freq: Two times a day (BID) | ORAL | Status: DC
  Administered 2020-08-30 – 2020-08-31 (×2): 300 mg via ORAL
  Filled 2020-08-30 (×2): qty 1

## 2020-08-30 MED ORDER — ATORVASTATIN CALCIUM 10 MG PO TABS
20.0000 mg | ORAL_TABLET | Freq: Every day | ORAL | Status: DC
  Administered 2020-08-30: 20 mg via ORAL
  Filled 2020-08-30: qty 2

## 2020-08-30 MED ORDER — STROKE: EARLY STAGES OF RECOVERY BOOK
Freq: Once | Status: AC
  Administered 2020-08-31: 1
  Filled 2020-08-30: qty 1

## 2020-08-30 MED ORDER — BACLOFEN 10 MG PO TABS
20.0000 mg | ORAL_TABLET | Freq: Three times a day (TID) | ORAL | Status: DC
  Administered 2020-08-30 – 2020-08-31 (×2): 20 mg via ORAL
  Filled 2020-08-30 (×2): qty 2
  Filled 2020-08-30: qty 1

## 2020-08-30 MED ORDER — ACETAMINOPHEN 325 MG PO TABS: 650.0000 mg | ORAL_TABLET | ORAL | Status: DC | PRN

## 2020-08-30 MED ORDER — LISINOPRIL 2.5 MG PO TABS
2.5000 mg | ORAL_TABLET | Freq: Every day | ORAL | Status: DC
  Filled 2020-08-30: qty 1

## 2020-08-30 MED ORDER — CLOPIDOGREL BISULFATE 75 MG PO TABS
75.0000 mg | ORAL_TABLET | Freq: Every day | ORAL | Status: DC
  Administered 2020-08-31: 75 mg via ORAL
  Filled 2020-08-30: qty 1

## 2020-08-30 MED ORDER — SENNOSIDES-DOCUSATE SODIUM 8.6-50 MG PO TABS: 1.0000 | ORAL_TABLET | Freq: Every evening | ORAL | Status: DC | PRN

## 2020-08-30 MED ORDER — CARVEDILOL 6.25 MG PO TABS: 6.2500 mg | ORAL_TABLET | Freq: Two times a day (BID) | ORAL | Status: DC

## 2020-08-30 MED ORDER — ACETAMINOPHEN 650 MG RE SUPP: 650.0000 mg | RECTAL | Status: DC | PRN

## 2020-08-30 MED ORDER — ENOXAPARIN SODIUM 40 MG/0.4ML ~~LOC~~ SOLN
40.0000 mg | SUBCUTANEOUS | Status: DC
  Administered 2020-08-31: 40 mg via SUBCUTANEOUS
  Filled 2020-08-30: qty 0.4

## 2020-08-30 NOTE — H&P (Signed)
History and Physical    Abigail Campos PNT:[id number] DOB: November 03, 1971 DOA: 08/30/2020  PCP: Colon Branch, MD   Patient coming from: Home  I have personally briefly reviewed patient's old medical records in [place]  Chief Complaint: Left eye droop, falls  HPI: Abigail Campos is a 49 y.o. female with medical history significant for intracranial hemorrhage, residual left hemiplegia, hypertension. Patient presented to the ED with complaints of 2 falls, first fall was 4 days ago, and a second fall last night-Thursday.  And new left eye droop noticed by sister-in-law, and god- son when she was video chatting earlier today.  Spouse also increase the patient's left is a bit droopy. Patient is not sure exactly why she fell both times, but she was using her cane which she uses to ambulate with at baseline.   Patient has residual deficits from her prior stroke.she tells me she has some mild droop involving the left side of her face, she is also unable to move or use her left upper extremity, she has some weakness in her left lower extremity, and visual field defects involving her left eye, she also has baseline tingling involving her left upper and lower extremity.  Patient reports compliance with Plavix daily.  ED Course: Blood pressure systolic 086P to 619J.  Temperature 97.6.  Unremarkable CBC, BMP.  Head CT no acute intracranial abnormality, redemonstrated chronic encephalomalacia malacia within the right frontoparietal lobes basal ganglia and insular at site of prior hemorrhage.  Telemetry neurology consulted at [facility], patient outside TPA window, admission for stroke work-up recommended.  Review of Systems: As per HPI all other systems reviewed and negative.  Past Medical History:  Diagnosis Date  . Anxiety and depression   . Essential hypertension 10/19/2007   03-2010: metoprolol changed to bystolic (was not feeling well on it, no specific allergy or reaction)      . Hemiplegia ([place])   . Hyperlipemia   . Hypertension   . Migraine   . Neuromuscular disorder ([place])    left sided hemiplegia - has brace for l leg and walks with a cane  . Ovarian cancer ([place])   . Sleep apnea    not wearing c-pap yet  . Stroke [facility]) 2017    Past Surgical History:  Procedure Laterality Date  . ABDOMINAL HYSTERECTOMY  11-14-07   no oophorectomy per surgical report  . CESAREAN SECTION     [id number]  . INGUINAL HERNIA REPAIR     2004     reports that she has never smoked. She has never used smokeless tobacco. She reports that she does not drink alcohol and does not use drugs.  Allergies  Allergen Reactions  . Bee Venom Anaphylaxis  . Peanut-Containing Drug Products Anaphylaxis  . Hydrocodone Hives    Generalize itching w/o rash  . Isovue [Iopamidol] Hives and Itching    Pt broke out with one facial hive.  Had itching on her right upper ant and posterior arm w/o evidence of hives.  Pt given water and 25mg  benadryl po.  We observed pt for 30 minutes before d/c.  J. Bohm    . Ambien [Zolpidem Tartrate] Other (See Comments)    forgetfulness  . Aspirin Swelling    Reports blisters w/ ASA but pt reports is ok w/ Motrin, Advil, naproxen  . Latex Hives    Family History  Problem Relation Age of Onset  . Diabetes Mother   . Hypertension Mother   .  Glaucoma Mother   . Colon cancer Mother        mother age 32  . Rectal cancer Father        dx 40  . Stroke Paternal Grandfather   . Stroke Maternal Grandfather   . Diabetes Brother   . Hypertension Brother   . Heart disease Maternal Aunt        x 2 did 2012 CAD-CHF  . Diabetes Brother   . Hypertension Brother   . Lung cancer Maternal Aunt   . Breast cancer Neg Hx   . Esophageal cancer Neg Hx   . Stomach cancer Neg Hx     Prior to Admission medications   Medication Sig Start Date End Date Taking? Authorizing Provider  atorvastatin (LIPITOR) 20 MG tablet Take 1 tablet (20 mg total) by mouth daily at 6 PM.  01/31/20   Colon Branch, MD  baclofen (LIORESAL) 20 MG tablet TAKE 1 TABLET BY MOUTH THREE TIMES A DAY 08/02/20   Jamse Arn, MD  buPROPion (WELLBUTRIN) 100 MG tablet Take 1 tablet (100 mg total) by mouth 2 (two) times daily. 02/19/20   Colon Branch, MD  carvedilol (COREG) 6.25 MG tablet Take 1 tablet (6.25 mg total) by mouth 2 (two) times daily with a meal. 06/24/20   Colon Branch, MD  cephALEXin (KEFLEX) 500 MG capsule Take 1 capsule (500 mg total) by mouth 3 (three) times daily. 07/18/20   Trula Slade, DPM  clopidogrel (PLAVIX) 75 MG tablet Take 1 tablet (75 mg total) by mouth daily. 05/28/20   Colon Branch, MD  Cyanocobalamin (VITAMIN B 12 PO) Take 1 tablet by mouth daily.    [provider]  escitalopram (LEXAPRO) 20 MG tablet Take 1 tablet (20 mg total) by mouth daily. 05/14/20   Colon Branch, MD  furosemide (LASIX) 20 MG tablet Take 1 tablet (20 mg total) by mouth daily. 08/12/20   Colon Branch, MD  gabapentin (NEURONTIN) 300 MG capsule Take 1 capsule (300 mg total) by mouth 2 (two) times daily. 07/11/20   [name], MD  lisinopril (ZESTRIL) 2.5 MG tablet Take 1 tablet (2.5 mg total) by mouth daily. 08/06/20   Colon Branch, MD  traZODone (DESYREL) 100 MG tablet Take 1 tablet (100 mg total) by mouth at bedtime as needed for sleep. 08/02/20   Colon Branch, MD    Physical Exam: Vitals:   08/30/20 1900 08/30/20 2030 08/30/20 2115 08/30/20 2252  BP: (!) 151/93 (!) 129/97 (!) 128/97 134/90  Pulse: (!) 58 69 66 (!) 59  Resp: 15 20 14 18   Temp:    98.3 F (36.8 C)  TempSrc:    Oral  SpO2: 98% 97% 99% 100%  Weight:      Height:        Constitutional: NAD, calm, comfortable Vitals:   08/30/20 1900 08/30/20 2030 08/30/20 2115 08/30/20 2252  BP: (!) 151/93 (!) 129/97 (!) 128/97 134/90  Pulse: (!) 58 69 66 (!) 59  Resp: 15 20 14 18   Temp:    98.3 F (36.8 C)  TempSrc:    Oral  SpO2: 98% 97% 99% 100%  Weight:      Height:       Eyes: PERRL, lids and conjunctivae  normal ENMT: Mucous membranes are moist.  Neck: normal, supple, no masses, no thyromegaly Respiratory: clear to auscultation bilaterally, no wheezing, no crackles. Normal respiratory effort. No accessory muscle use.  Cardiovascular: Regular rate and  rhythm,  No extremity edema. 2+ pedal pulses.   Abdomen: no tenderness, no masses palpated. No hepatosplenomegaly. Bowel sounds positive.  Musculoskeletal: no clubbing / cyanosis. No joint deformity upper and lower extremities. Good ROM, no contractures. Normal muscle tone.  Skin: no rashes, lesions, ulcers. No induration Neurologic:  Neurological:     Mental Status: She is alert.     GCS: GCS eye subscore is 4. GCS verbal subscore is 5. GCS motor subscore is 6.     Comments: Mental Status:  Alert, oriented, thought content appropriate, able to give a coherent history. Speech fluent without evidence of aphasia. Able to follow 2 step commands without difficulty.  Cranial Nerves:  II:  Peripheral visual fields impaired on the left-chronic, baseline, pupils equal, round, reactive to light III,IV, VI: ptosis not present, extra-ocular motions intact bilaterally.  Left eye appears to be slightly on a lower level horizontally compared to the right eye V,VII: smile assymmetric, mild flattening of left nasolabial fold at rest, more pronounced when smiling, this is baseline from prior stroke.  Eyebrows raise symmetric, facial light touch sensation equal VIII: hearing grossly normal to voice  X: Not tested XI: bilateral shoulder shrug symmetric and strong XII: midline tongue extension without fassiculations Motor:   5/5 strength right upper extremity, 0/5 strength left lower extremity, 5/5 strength right lower extremity, 4+/5 strength left lower extremity. Sensory: Sensation intact to light touch in all extremities, but is tingling present left upper and lower extremity. Cerebellar: Not tested. CV: distal pulses palpable throughout    Psychiatric: Normal  judgment and insight. Alert and oriented x 3. Normal mood.   Labs on Admission: I have personally reviewed following labs and imaging studies  CBC: Recent Labs  Lab 08/30/20 1527  WBC 4.7  NEUTROABS 2.3  HGB 12.4  HCT 39.8  MCV 97.5  PLT 681   Basic Metabolic Panel: Recent Labs  Lab 08/30/20 1527  NA 137  K 3.8  CL 100  CO2 29  GLUCOSE 89  BUN 11  CREATININE 1.09*  CALCIUM 9.0   CBG: Recent Labs  Lab 08/30/20 1622  GLUCAP 82    Radiological Exams on Admission: DG Ribs Unilateral W/Chest Left  Result Date: 08/30/2020 CLINICAL DATA:  Status post fall. EXAM: LEFT RIBS AND CHEST - 3+ VIEW COMPARISON:  March 01, 2020 FINDINGS: A radiopaque marker was placed at the site of the patient's pain. No fracture or other bone lesions are seen involving the ribs. There is no evidence of pneumothorax or pleural effusion. Both lungs are clear. Heart size and mediastinal contours are within normal limits. IMPRESSION: Negative. Electronically Signed   By: Virgina Norfolk M.D.   On: 08/30/2020 16:08   CT Head Wo Contrast  Result Date: 08/30/2020 CLINICAL DATA:  Neuro deficit, acute, stroke suspected. Additional provided: Fall last night, left eye droop this morning, history of stroke 4 years ago with left-sided weakness. EXAM: CT HEAD WITHOUT CONTRAST TECHNIQUE: Contiguous axial images were obtained from the base of the skull through the vertex without intravenous contrast. COMPARISON:  Prior head CT examinations 11/03/2018 and earlier. FINDINGS: Brain: Redemonstrated chronic encephalomalacia within the right frontoparietal lobes, right basal ganglia and right insula at site of prior hemorrhage. There is no acute intracranial hemorrhage. No acute demarcated cortical infarct is identified. No extra-axial fluid collection. No evidence of intracranial mass. No midline shift. Vascular: No hyperdense vessel.  Atherosclerotic calcifications. Skull: Normal. Negative for fracture or focal lesion.  Sinuses/Orbits: Visualized orbits show no acute finding.  Mild ethmoid sinus mucosal thickening. No significant mastoid effusion. IMPRESSION: No CT evidence of acute intracranial abnormality. Redemonstrated chronic encephalomalacia within the right frontoparietal lobes, right basal ganglia and right insula at site of prior hemorrhage. Mild ethmoid sinus mucosal thickening. Electronically Signed   By: Kellie Simmering DO   On: 08/30/2020 16:25   DG Humerus Left  Result Date: 08/30/2020 CLINICAL DATA:  Status post fall. EXAM: LEFT HUMERUS - 2+ VIEW COMPARISON:  None. FINDINGS: There is no evidence of fracture or other focal bone lesions. Soft tissues are unremarkable. IMPRESSION: Negative. Electronically Signed   By: Virgina Norfolk M.D.   On: 08/30/2020 16:06    EKG: Independently reviewed.  Sinus bradycardia rate 57, QTc 474.  Low voltage.  No significant changes from prior.  Assessment/Plan Principal Problem:   Facial droop Active Problems:   Essential hypertension   ICH (intracerebral hemorrhage) (HCC) - R basal ganglia due to hypertensive emergency   Hemiplegia, post-stroke (HCC)   Spastic hemiplegia of left nondominant side due to nontraumatic intraparenchymal hemorrhage of brain (HCC)   Right Eye droop- subtle on neurologic exam, more of left eye being on a slightly lower level than right. Patient has chronic left-sided facial droop, that is unchanged.  At this time doubt stroke, but need to rule out.  Outside TPA window. -Evaluated by telemetry neurology, MRI brain, MRA head and neck recommended and ordered -Pending MRI MRA findings, consider in-house neurology consultation -Obtain echocardiogram -Continue Plavix, Lipitor -Lipid panel hemoglobin A1c in a.m. -PT evaluation -Check TSH -Passed bedside swallow evaluation -Hold antihypertensives lisinopril and carvedilol and Lasix for now to allow for permissive hypertension.  History of intracranial hemorrhage, with poststroke hemiplegia-  with significant residual deficits include left facial droop, dense left upper extremity paresis, left lower extremity weakness, left visual field deficits, left uppercase lower extremity paresthesias.  Spouse is primary caregiver.  Ambulates with cane at baseline.  Reports compliance with Plavix. -Resume Plavix, statin  Mechanical falls-high risk for falls with visual field defects and left-sided hemiparesis. -PT evaluation  Hypertension- Stable -lisinopril, carvedilol, Lasix held for now in case of possible stroke, to allow for permissive hypertension   DVT prophylaxis: Lovenox Code Status: Full code. Family Communication: Spouse on the phone. Disposition Plan:  ~ 1 - 2 days Consults called: None. Admission status: Observation, telemetry   Bethena Roys MD [facility]  08/30/2020, 12:36 AM

## 2020-08-30 NOTE — ED Triage Notes (Addendum)
She fell on her way to the bathroom last night. Left eye droop this am per pt. Hx of stroke 4 years ago with left sided weakness. C.o bruising and pain to her left ribs.

## 2020-08-30 NOTE — ED Notes (Signed)
Neuro consult done per video

## 2020-08-30 NOTE — Progress Notes (Signed)
49 year old female with history of intracranial hemorrhage, prior stroke with residual hemiplegia, HTN.  Presented to [facility] ED with complaints of 2 falls, and left facial droop.  Tele- neurology consulted, admission to rule out stroke recommended.  Temporary admission order placed- observation tele-bed.  Arlyce Dice, MD. [facility]. 08/30/20 6:55 PM  LOS- No charge.
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
## 2020-08-30 NOTE — Progress Notes (Signed)
Patient admitted from [facility], Alert X4, placed on tele, paged admitting MD, administered prior refused , medications. Husband at bedside.

## 2020-08-30 NOTE — ED Provider Notes (Signed)
[place] EMERGENCY DEPARTMENT Provider Note  [name]: [id number] Arrival date & time: 08/30/20 1418    History Chief Complaint  Patient presents with  . Fall    HPI  Abigail Campos is a 49 y.o. female with history of stroke in 2017 with residual L sided deficits has no use of L arm and weakness in L leg walks with a cane and leg brace reports she had two falls this week including one last night as she was walking to the bathroom. She is unsure how she fell, was using her cane at the time. Denies head injury but she is taking plavix. She has mild-moderate aching pain in L lateral arm and L ribs. She was video chatting with some family members earlier today and they noted she had a new left sided facial droop. She denies any new arm or leg weakness. No slurred speech. No other recent illness.    Past Medical History:  Diagnosis Date  . Anxiety and depression   . Essential hypertension 10/19/2007   03-2010: metoprolol changed to bystolic (was not feeling well on it, no specific allergy or reaction)    . Hemiplegia ([place])   . Hyperlipemia   . Hypertension   . Migraine   . Neuromuscular disorder ([place])    left sided hemiplegia - has brace for l leg and walks with a cane  . Ovarian cancer ([place])   . Sleep apnea    not wearing c-pap yet  . Stroke [facility]) 2017    Past Surgical History:  Procedure Laterality Date  . ABDOMINAL HYSTERECTOMY  11-14-07   no oophorectomy per surgical report  . CESAREAN SECTION     [id number]  . INGUINAL HERNIA REPAIR     2004    Family History  Problem Relation Age of Onset  . Diabetes Mother   . Hypertension Mother   . Glaucoma Mother   . Colon cancer Mother        mother age 20  . Rectal cancer Father        dx 69  . Stroke Paternal Grandfather   . Stroke Maternal Grandfather   . Diabetes Brother   . Hypertension Brother   . Heart disease Maternal Aunt        x 2 did 2012 CAD-CHF  . Diabetes Brother   . Hypertension Brother   .  Lung cancer Maternal Aunt   . Breast cancer Neg Hx   . Esophageal cancer Neg Hx   . Stomach cancer Neg Hx     Social History   Tobacco Use  . Smoking status: Never Smoker  . Smokeless tobacco: Never Used  Vaping Use  . Vaping Use: Never used  Substance Use Topics  . Alcohol use: No  . Drug use: No     Home Medications Prior to Admission medications   Medication Sig Start Date End Date Taking? Authorizing Provider  atorvastatin (LIPITOR) 20 MG tablet Take 1 tablet (20 mg total) by mouth daily at 6 PM. 01/31/20   Colon Branch, MD  baclofen (LIORESAL) 20 MG tablet TAKE 1 TABLET BY MOUTH THREE TIMES A DAY 08/02/20   Jamse Arn, MD  buPROPion (WELLBUTRIN) 100 MG tablet Take 1 tablet (100 mg total) by mouth 2 (two) times daily. 02/19/20   Colon Branch, MD  carvedilol (COREG) 6.25 MG tablet Take 1 tablet (6.25 mg total) by mouth 2 (two) times daily with a meal. 06/24/20   Paz, Burley,  MD  cephALEXin (KEFLEX) 500 MG capsule Take 1 capsule (500 mg total) by mouth 3 (three) times daily. 07/18/20   Trula Slade, DPM  clopidogrel (PLAVIX) 75 MG tablet Take 1 tablet (75 mg total) by mouth daily. 05/28/20   Colon Branch, MD  Cyanocobalamin (VITAMIN B 12 PO) Take 1 tablet by mouth daily.    [provider]  escitalopram (LEXAPRO) 20 MG tablet Take 1 tablet (20 mg total) by mouth daily. 05/14/20   Colon Branch, MD  furosemide (LASIX) 20 MG tablet Take 1 tablet (20 mg total) by mouth daily. 08/12/20   Colon Branch, MD  gabapentin (NEURONTIN) 300 MG capsule Take 1 capsule (300 mg total) by mouth 2 (two) times daily. 07/11/20   [name], MD  lisinopril (ZESTRIL) 2.5 MG tablet Take 1 tablet (2.5 mg total) by mouth daily. 08/06/20   Colon Branch, MD  traZODone (DESYREL) 100 MG tablet Take 1 tablet (100 mg total) by mouth at bedtime as needed for sleep. 08/02/20   Colon Branch, MD     Allergies    Bee venom, Peanut-containing drug products, Hydrocodone, Isovue [iopamidol], Ambien  [zolpidem tartrate], Aspirin, and Latex   Review of Systems   Review of Systems A comprehensive review of systems was completed and negative except as noted in HPI.    Physical Exam BP (!) 165/97 (BP Location: Right Arm)   Pulse 60   Temp 98 F (36.7 C)   Resp 19   Ht 4\' 11"  (1.499 m)   Wt 74.8 kg   SpO2 99%   BMI 33.33 kg/m   Physical Exam Vitals and nursing note reviewed.  Constitutional:      Appearance: Normal appearance.  HENT:     Head: Normocephalic and atraumatic.     Nose: Nose normal.     Mouth/Throat:     Mouth: Mucous membranes are moist.  Eyes:     Extraocular Movements: Extraocular movements intact.     Conjunctiva/sclera: Conjunctivae normal.  Cardiovascular:     Rate and Rhythm: Normal rate.  Pulmonary:     Effort: Pulmonary effort is normal.     Breath sounds: Normal breath sounds.  Chest:     Chest wall: Tenderness (L lateral chest wall) present.  Abdominal:     General: Abdomen is flat.     Palpations: Abdomen is soft.     Tenderness: There is no abdominal tenderness.  Musculoskeletal:        General: Tenderness (L proximal humerus) present. No swelling. Normal range of motion.     Cervical back: Neck supple.  Skin:    General: Skin is warm and dry.  Neurological:     Mental Status: She is alert and oriented to person, place, and time.     Cranial Nerves: Cranial nerve deficit (L facial droop, partial weakness of forehead) present.     Motor: Weakness (L hemiplegia) present.     Coordination: Coordination normal.  Psychiatric:        Mood and Affect: Mood normal.      ED Results / Procedures / Treatments   Labs (all labs ordered are listed, but only abnormal results are displayed) Labs Reviewed  BASIC METABOLIC PANEL - Abnormal; Notable for the following components:      Result Value   Creatinine, Ser 1.09 (*)    All other components within normal limits  SARS CORONAVIRUS 2 BY RT PCR (HOSPITAL ORDER, [place] [facility])  CBC WITH DIFFERENTIAL/PLATELET  CBG MONITORING, ED    EKG EKG Interpretation  Date/Time:  Friday August 30 2020 16:30:57 EDT Ventricular Rate:  57 PR Interval:    QRS Duration: 98 QT Interval:  486 QTC Calculation: 474 R Axis:   5 Text Interpretation: Sinus rhythm Low voltage, extremity and precordial leads No significant change since last tracing Confirmed by Calvert Cantor [id number]) on 08/30/2020 4:34:17 PM    Radiology DG Ribs Unilateral W/Chest Left  Result Date: 08/30/2020 CLINICAL DATA:  Status post fall. EXAM: LEFT RIBS AND CHEST - 3+ VIEW COMPARISON:  March 01, 2020 FINDINGS: A radiopaque marker was placed at the site of the patient's pain. No fracture or other bone lesions are seen involving the ribs. There is no evidence of pneumothorax or pleural effusion. Both lungs are clear. Heart size and mediastinal contours are within normal limits. IMPRESSION: Negative. Electronically Signed   By: Virgina Norfolk M.D.   On: 08/30/2020 16:08   CT Head Wo Contrast  Result Date: 08/30/2020 CLINICAL DATA:  Neuro deficit, acute, stroke suspected. Additional provided: Fall last night, left eye droop this morning, history of stroke 4 years ago with left-sided weakness. EXAM: CT HEAD WITHOUT CONTRAST TECHNIQUE: Contiguous axial images were obtained from the base of the skull through the vertex without intravenous contrast. COMPARISON:  Prior head CT examinations 11/03/2018 and earlier. FINDINGS: Brain: Redemonstrated chronic encephalomalacia within the right frontoparietal lobes, right basal ganglia and right insula at site of prior hemorrhage. There is no acute intracranial hemorrhage. No acute demarcated cortical infarct is identified. No extra-axial fluid collection. No evidence of intracranial mass. No midline shift. Vascular: No hyperdense vessel.  Atherosclerotic calcifications. Skull: Normal. Negative for fracture or focal lesion. Sinuses/Orbits: Visualized orbits show no  acute finding. Mild ethmoid sinus mucosal thickening. No significant mastoid effusion. IMPRESSION: No CT evidence of acute intracranial abnormality. Redemonstrated chronic encephalomalacia within the right frontoparietal lobes, right basal ganglia and right insula at site of prior hemorrhage. Mild ethmoid sinus mucosal thickening. Electronically Signed   By: Kellie Simmering DO   On: 08/30/2020 16:25   DG Humerus Left  Result Date: 08/30/2020 CLINICAL DATA:  Status post fall. EXAM: LEFT HUMERUS - 2+ VIEW COMPARISON:  None. FINDINGS: There is no evidence of fracture or other focal bone lesions. Soft tissues are unremarkable. IMPRESSION: Negative. Electronically Signed   By: Virgina Norfolk M.D.   On: 08/30/2020 16:06    Procedures Procedures  Medications Ordered in the ED Medications  atorvastatin (LIPITOR) tablet 20 mg (has no administration in time range)  baclofen (LIORESAL) tablet 20 mg (has no administration in time range)  buPROPion (WELLBUTRIN) tablet 100 mg (has no administration in time range)  carvedilol (COREG) tablet 6.25 mg (has no administration in time range)  clopidogrel (PLAVIX) tablet 75 mg (has no administration in time range)  gabapentin (NEURONTIN) capsule 300 mg (has no administration in time range)  lisinopril (ZESTRIL) tablet 2.5 mg (has no administration in time range)  traZODone (DESYREL) tablet 100 mg (has no administration in time range)  furosemide (LASIX) tablet 20 mg (has no administration in time range)     MDM Rules/Calculators/A&P MDM Patient with prior history of stroke with residual L sided hemiparesis here after a fall last night also noted to have a new facial droop which at least partially includes the forehead. Bell's palsy vs new stroke. Will check labs, EKG and send for CT head. Xray of L ribs and humerus for injuries from fall.  ED Course  I have reviewed the triage vital signs and the nursing notes.  Pertinent labs & imaging results that were  available during my care of the patient were reviewed by me and considered in my medical decision making (see chart for details).  Clinical Course as of Aug 30 1854  Fri Aug 30, 2020  1606 CBC is normal.    [CS]  1609 Humerus neg for fracture.    [CS]  7670 Rib xray neg for fracture   [CS]  1100 BMP is normal.    [CS]  3496 CT without acute process.    [CS]  1164 Patient continues to have some partial L facial droop. Will ask for Teleneurology consult to help distinguish bell's palsy from new deficit.    [CS]  74 Spoke with Dr. Gordy Levan, Teleneurology, who agrees patient is not a candidate for tPA. She recommends admission for further evaluation. Will add Covid, discuss with Hospitalist at [facility]   [CS]  [place] with Dr. Arlyce Dice, Hospitalist, who will admit.    [CS]    Clinical Course User Index [CS] Truddie Hidden, MD    Final Clinical Impression(s) / ED Diagnoses Final diagnoses:  Fall, initial encounter  Facial droop    Rx / DC Orders ED Discharge Orders    None       Truddie Hidden, MD 08/30/20 [phone]

## 2020-08-30 NOTE — Consult Note (Addendum)
[facility] Tele[facility]urology Consult Services  Patient consulted to telemedicine  Stat Consult  Date of Service:   08/30/2020 16:53:57  Impression:     .  R29.810 - Facial numbness/ Facial weakness  Comments/Sign-Out: 49 year old woman with history of prior stroke (per radiology reports referencing prior hemorrhage, this may have been hemorrhagic) with left hemiparesis and sensory deficits presents with new/worsened left facial droop. Current NIHSS=9. However, the only deficit that is new for her is subtle left facial droop. She is outside of the time window for tPA and with only very minor new symptoms I do not think large vessel occlusion is likely. However, I would recommend admission for MRI, neurology consultation, further workup.  CT HEAD: Showed No Acute Hemorrhage or Acute Core Infarct Reviewed No acute process. Encephalomalacea in the right basal ganglia, frontal parietal lobe, right insula. Per radiology interpretation this is apparently at the site of a prior hemorrhage.  Metrics: [facility] Notification Time: 08/30/2020 16:51:57 Stamp Time: 08/30/2020 16:53:57 Callback Response Time: 08/30/2020 17:00:14  Our recommendations are outlined below.  Recommendations:     .  Continue Plavix for now. I would defer on further antiplatelet recommendations to in-house neurology once her workup is more complete and more information is available about her prior stroke/hemorrhage     .  Continue Statin  Imaging Studies:     .  MRI Head Without Contrast     .  MRA Head and Neck Without Contrast When Available - Stroke Protocol     .  Echocardiogram - Transthoracic Echocardiogram  Therapies:     .  Physical Therapy, Occupational Therapy, Speech Therapy Assessment When Applicable  Disposition: Neurology Follow Up Recommended  Sign Out:     .  Discussed with Emergency Department  Provider  ----------------------------------------------------------------------------------------------------  Chief Complaint: Left facial droop  History of Present Illness: Patient is a 49 year old Female.  49 year old woman with history of prior stroke with residual left sided weakness requiring use of a cane. She came to the emergency department today when she was noted to have new left-sided facial droop. She recalls looking in the mirror at 1045 and thinking her face looked normal. She was then talking to a family member at 36 o'clock her face was felt to be a symmetric. She does agree that it is a little bit trippy in the left side which is not normal for her. With regard to her prior stroke, she indicated that it occurred "because I was stressed out". Currently, on review of systems, she denies any fever, chills, skin rashes, unexplained weight loss. No headache.   Past Medical History:     . Hypertension     . Hyperlipidemia     . Stroke     . There is NO history of Diabetes Mellitus  Anticoagulant use:  No  Antiplatelet use:   NIHSS may not be reliable due to: Significant deficits from prior stroke  Examination: BP(151/100), Pulse(55), 1A: Level of Consciousness - Alert; keenly responsive + 0 1B: Ask Month and Age - Both Questions Right + 0 1C: Blink Eyes & Squeeze Hands - Performs Both Tasks + 0 2: Test Horizontal Extraocular Movements - Normal + 0 3: Test Visual Fields - No Visual Loss + 0 4: Test Facial Palsy (Use Grimace if Obtunded) - Minor paralysis (flat nasolabial fold, smile asymmetry) + 1 5A: Test Left Arm Motor Drift - No Movement + 4 5B: Test Right Arm Motor Drift - No Drift for 10 Seconds + 0  6A: Test Left Leg Motor Drift - Some Effort Against Gravity + 2 6B: Test Right Leg Motor Drift - No Drift for 5 Seconds + 0 7: Test Limb Ataxia (FNF/Heel-Shin) - No Ataxia + 0 8: Test Sensation - Mild-Moderate Loss: Less Sharp/More Dull + 1 9: Test Language/Aphasia  - Normal; No aphasia + 0 10: Test Dysarthria - Mild-Moderate Dysarthria: Slurring but can be understood + 1 11: Test Extinction/Inattention - No abnormality + 0  NIHSS Score: 9    Patient is being evaluated for possible acute neurologic impairment and high probability of imminent or life-threatening deterioration. I spent total of 25 minutes providing care to this patient, including time for face to face visit via telemedicine, review of medical records, imaging studies and discussion of findings with providers, the patient and/or family.   Dr Alonna Buckler   [facility] [phone]  Case [id number]

## 2020-08-31 ENCOUNTER — Observation Stay (HOSPITAL_COMMUNITY): Payer: No Typology Code available for payment source

## 2020-08-31 ENCOUNTER — Observation Stay (HOSPITAL_BASED_OUTPATIENT_CLINIC_OR_DEPARTMENT_OTHER): Payer: No Typology Code available for payment source

## 2020-08-31 ENCOUNTER — Other Ambulatory Visit: Payer: Self-pay | Admitting: Physical Medicine & Rehabilitation

## 2020-08-31 DIAGNOSIS — R29810 Facial weakness: Secondary | ICD-10-CM

## 2020-08-31 DIAGNOSIS — I610 Nontraumatic intracerebral hemorrhage in hemisphere, subcortical: Secondary | ICD-10-CM | POA: Diagnosis not present

## 2020-08-31 DIAGNOSIS — I6389 Other cerebral infarction: Secondary | ICD-10-CM

## 2020-08-31 DIAGNOSIS — I69359 Hemiplegia and hemiparesis following cerebral infarction affecting unspecified side: Secondary | ICD-10-CM | POA: Diagnosis not present

## 2020-08-31 DIAGNOSIS — I619 Nontraumatic intracerebral hemorrhage, unspecified: Secondary | ICD-10-CM

## 2020-08-31 DIAGNOSIS — G8114 Spastic hemiplegia affecting left nondominant side: Secondary | ICD-10-CM

## 2020-08-31 DIAGNOSIS — I10 Essential (primary) hypertension: Secondary | ICD-10-CM | POA: Diagnosis not present

## 2020-08-31 LAB — HEMOGLOBIN A1C
Hgb A1c MFr Bld: 6.1 % — ABNORMAL HIGH (ref 4.8–5.6)
Mean Plasma Glucose: 128.37 mg/dL

## 2020-08-31 LAB — ECHOCARDIOGRAM COMPLETE
Area-P 1/2: 3.34 cm2
Height: 59 in
S' Lateral: 3.2 cm
Weight: 2640 oz

## 2020-08-31 LAB — LIPID PANEL
Cholesterol: 128 mg/dL (ref 0–200)
HDL: 37 mg/dL — ABNORMAL LOW (ref >40)
LDL Cholesterol: 78 mg/dL (ref 0–99)
Total CHOL/HDL Ratio: 3.5 RATIO
Triglycerides: 65 mg/dL (ref <150)
VLDL: 13 mg/dL (ref 0–40)

## 2020-08-31 LAB — TSH: TSH: 2.01 u[IU]/mL (ref 0.350–4.500)

## 2020-08-31 LAB — HIV ANTIBODY (ROUTINE TESTING W REFLEX): HIV Screen 4th Generation wRfx: NONREACTIVE

## 2020-08-31 IMAGING — MR MR MRA NECK W/O CM
2 of 4 series · 19 of 48 positions shown · non-contrast
Comparison: None.

CLINICAL DATA: Multiple falls. Left hemiplegia. New onset left eye
droop



[Series 2: (id) loc ssfse · axial · 5.0mm · 0.59mm/px · 1 of 15 slices shown]
[im 1/15]
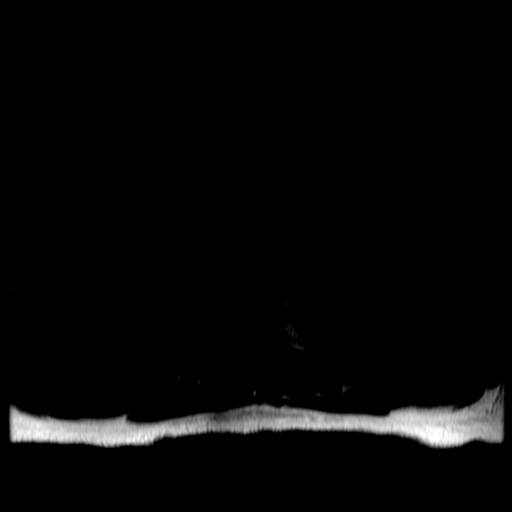

[Series 4: sag inhance (id) · sagittal · 1.2mm · 0.47mm/px · 18 of 360 slices shown]
[im 1/360]
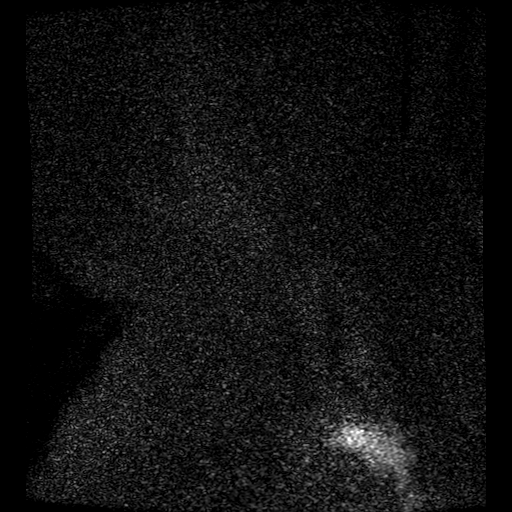
[im 12/360]
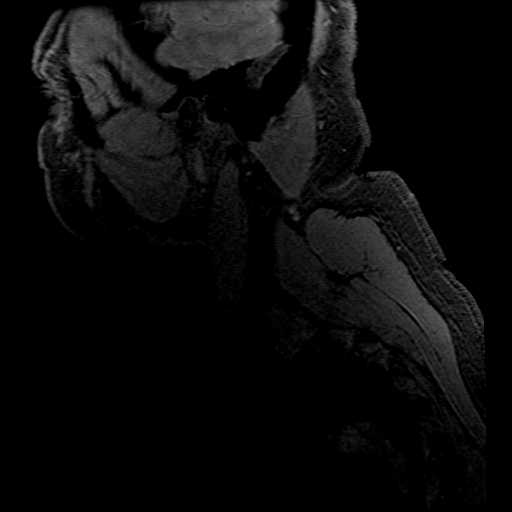
[im 24/360]
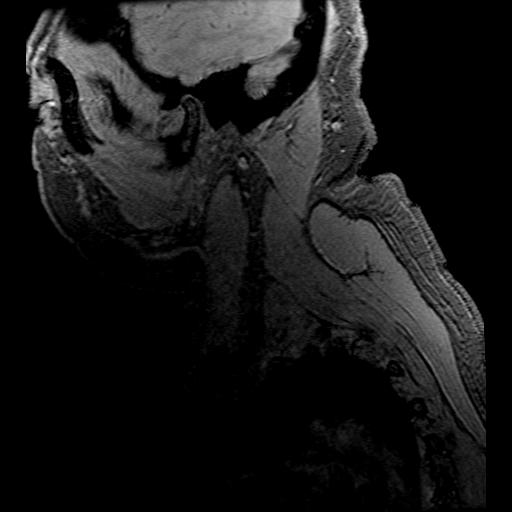
[im 35/360]
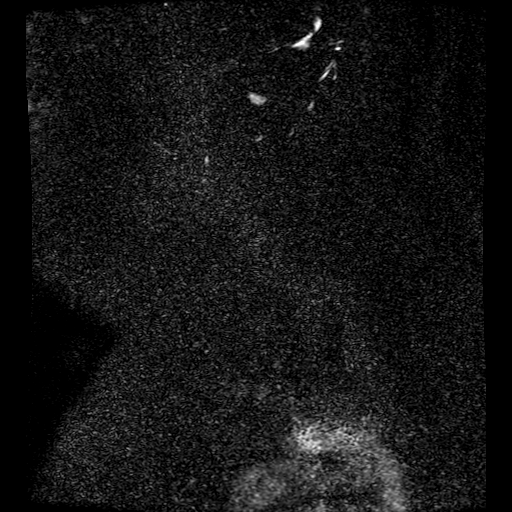
[im 47/360]
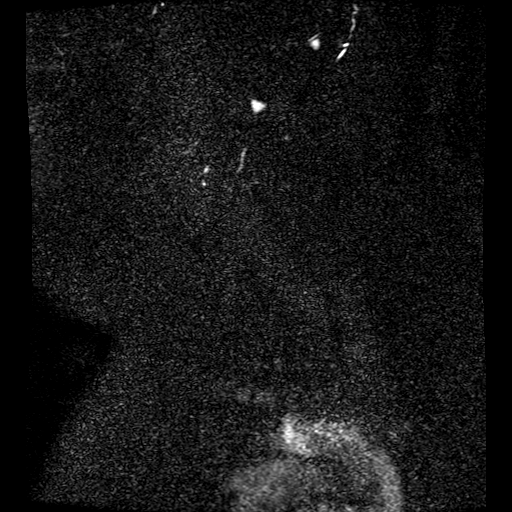
[im 58/360]
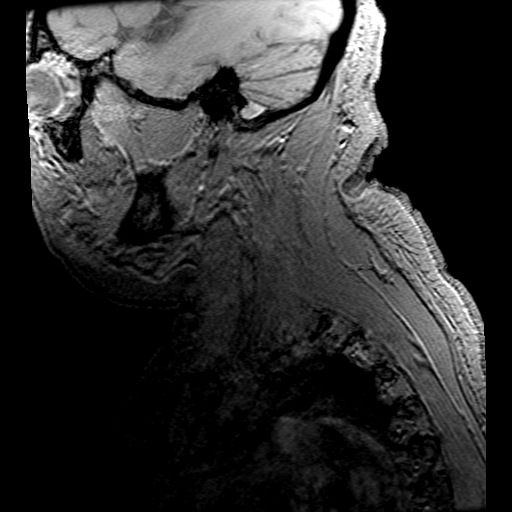
[im 70/360]
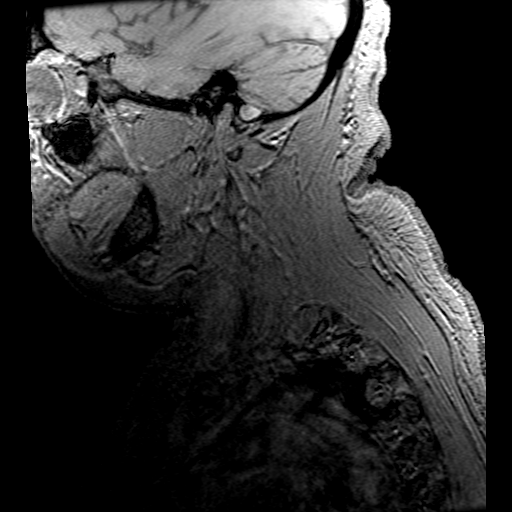
[im 82/360]
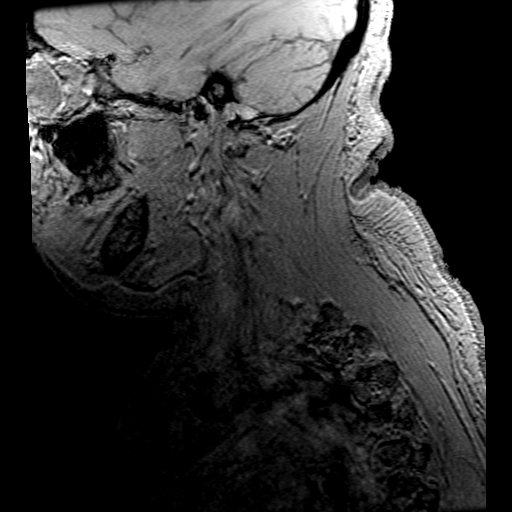
[im 93/360]
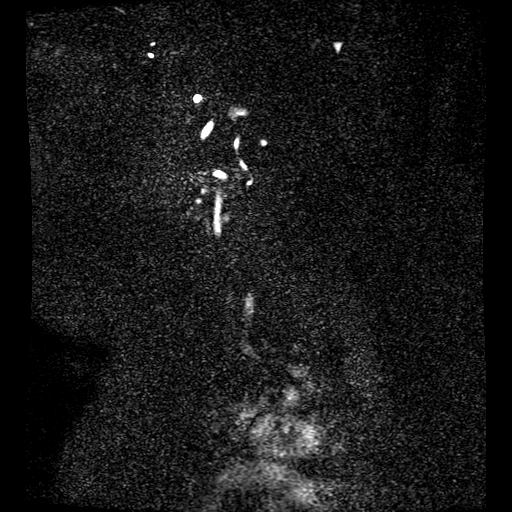
[im 105/360]
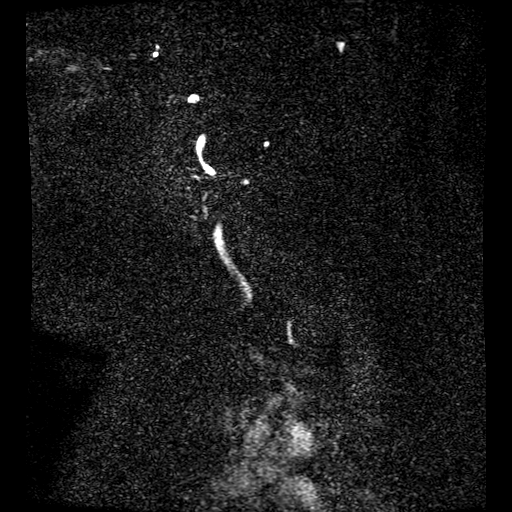
[im 116/360]
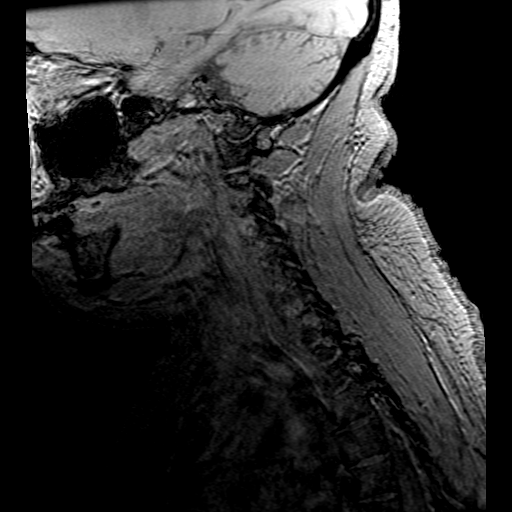
[im 128/360]
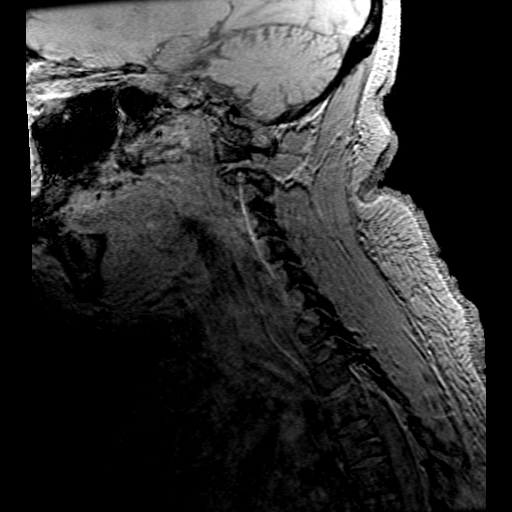
[im 163/360]
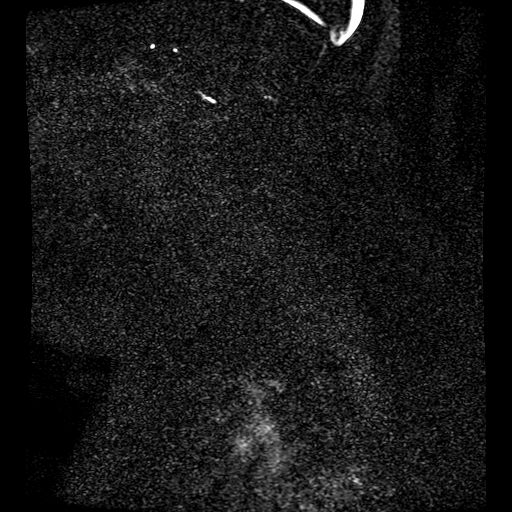
[im 186/360]
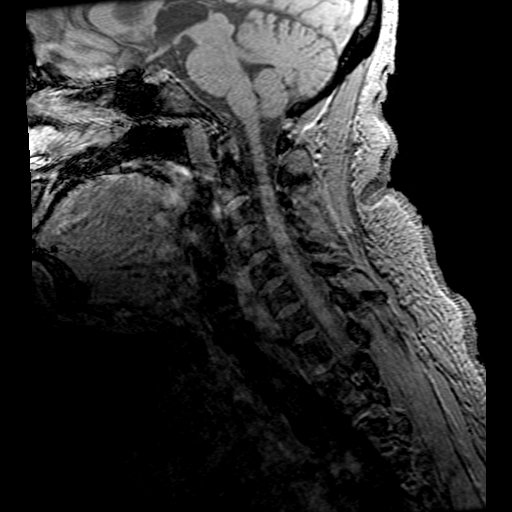
[im 209/360]
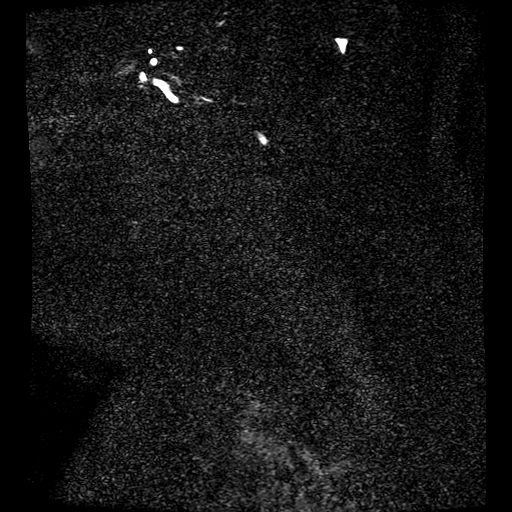
[im 255/360]
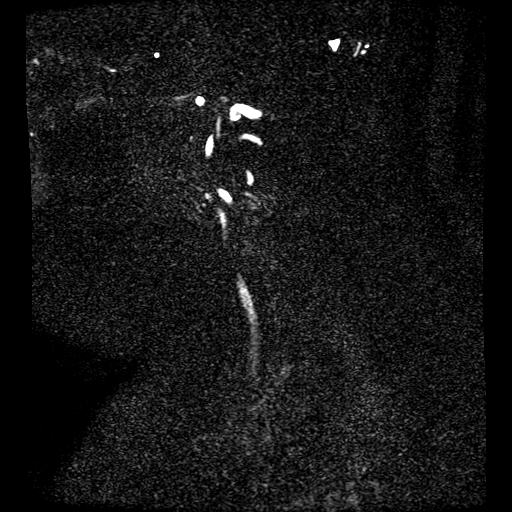
[im 302/360]
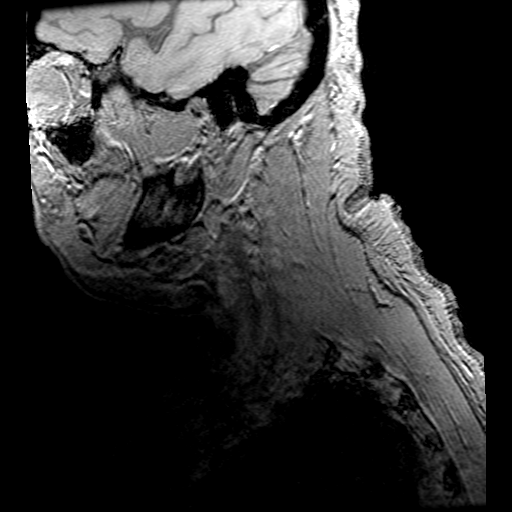
[im 348/360]
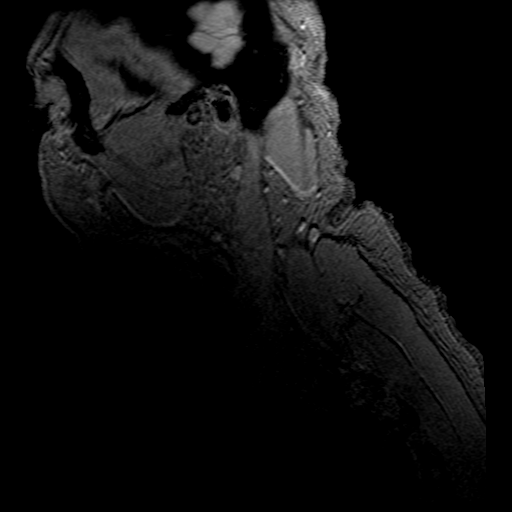

[19 of 48 positions shown; findings below may reference images not displayed]

FINDINGS: MRI HEAD FINDINGS

Brain: No acute infarct, acute hemorrhage or extra-axial collection.
Old right frontoparietal infarct with associated hemosiderin
deposition. There is mild surrounding gliosis. There are multiple
old small vessel infarcts of the left corona radiata. Mild atrophy.
No chronic microhemorrhage. Normal midline structures.

Vascular: Normal flow voids.

Skull and upper cervical spine: Normal marrow signal.

Sinuses/Orbits: Negative.

Other: None.

MRA HEAD FINDINGS

POSTERIOR CIRCULATION:

--Vertebral arteries: Normal V4 segments.

--Inferior cerebellar arteries: Normal.

--Basilar artery: Normal.

--Superior cerebellar arteries: Normal.

--Posterior cerebral arteries: Normal.

ANTERIOR CIRCULATION:

--Intracranial internal carotid arteries: Normal.

--Anterior cerebral arteries (ACA): Normal. Both A1 segments are
present. Patent anterior communicating artery (a-comm).

--Middle cerebral arteries (MCA): Normal.

MRA NECK FINDINGS

Normal carotid and vertebral arteries.
IMPRESSION: 1. No acute intracranial abnormality.
2. Old right frontoparietal infarct and multiple old small vessel
infarcts of the left corona radiata.
3. Normal MRA of the head and neck.

## 2020-08-31 IMAGING — MR MR HEAD W/O CM
6 of 11 series · 24 of 48 positions shown · non-contrast
Comparison: None.

CLINICAL DATA: Multiple falls. Left hemiplegia. New onset left eye
droop



[Series 2: DWI · axial · 3.0mm · 0.94mm/px · z∈[-100,+44]mm · 7 of 100 slices shown (1 of 2)]
[im 1/100]
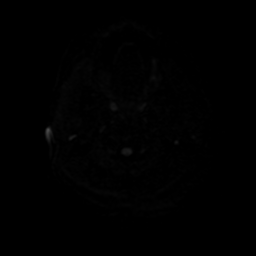
[im 17/100]
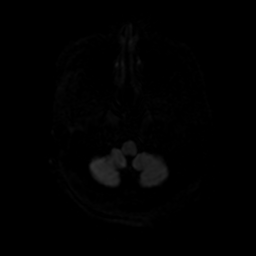
[im 34/100]
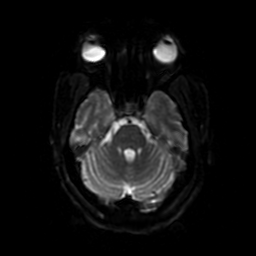
[im 50/100]
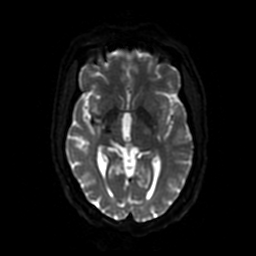
[im 67/100]
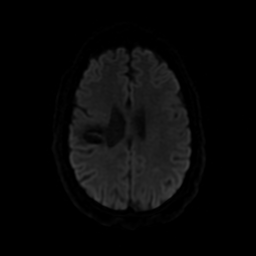
[im 83/100]
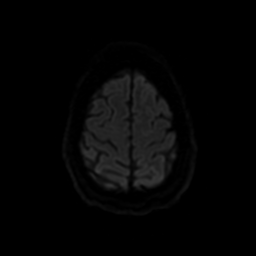
[im 100/100]
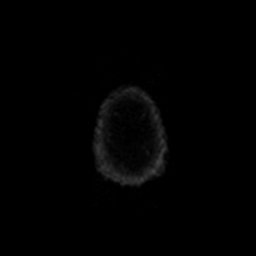

[Series 3: DWI · coronal · 4.0mm · 0.94mm/px · 6 of 74 slices shown (2 of 2)]
[im 1/74]
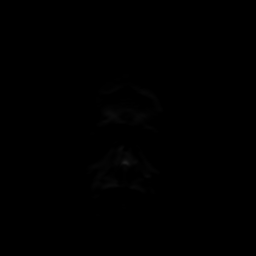
[im 15/74]
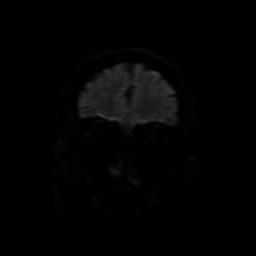
[im 30/74]
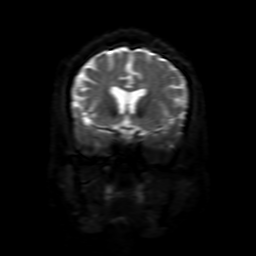
[im 44/74]
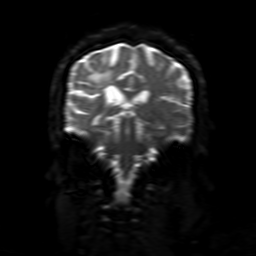
[im 59/74]
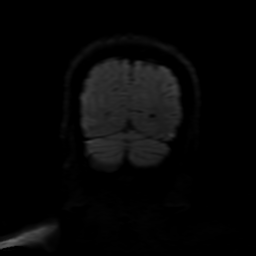
[im 74/74]
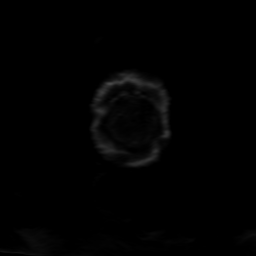

[Series 4: FLAIR · sagittal · 5.0mm · 0.23mm/px · 2 of 23 slices shown (1 of 2)]
[im 1/23]
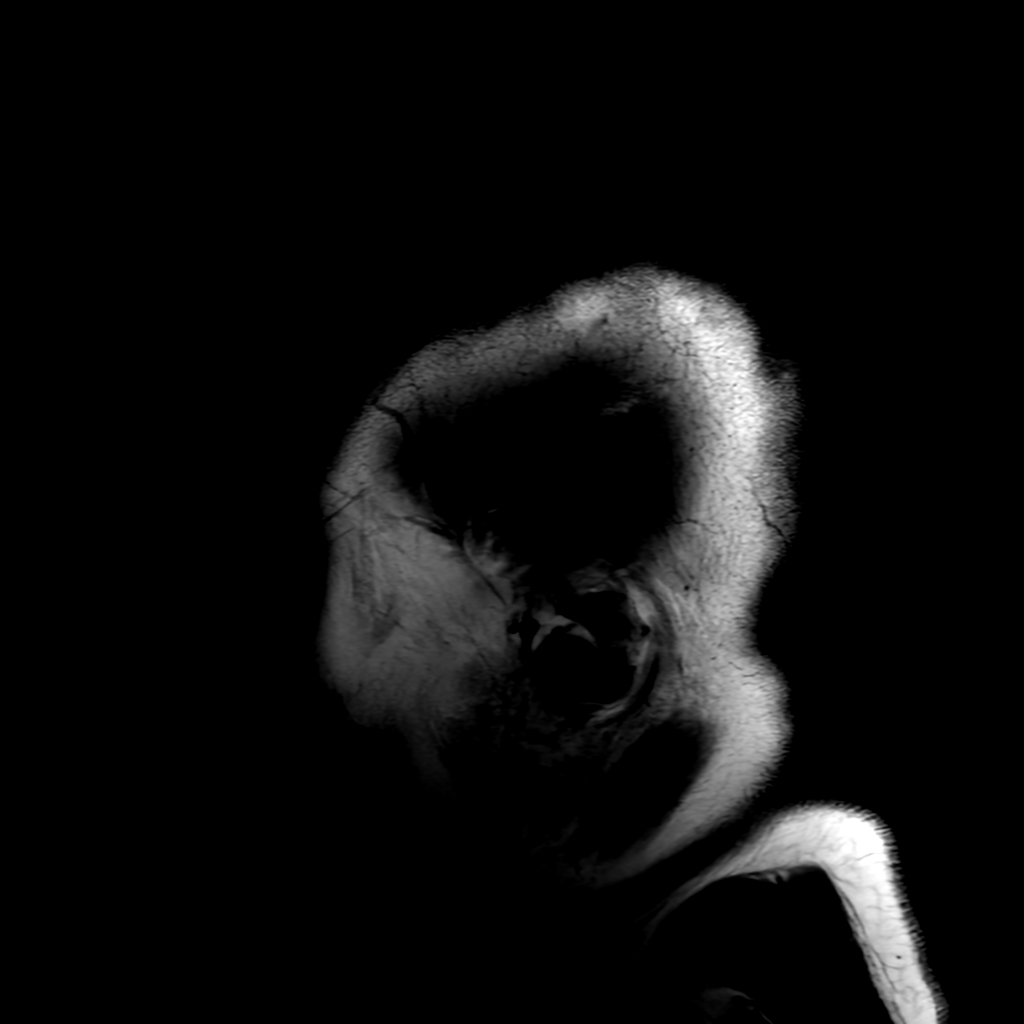
[im 23/23]
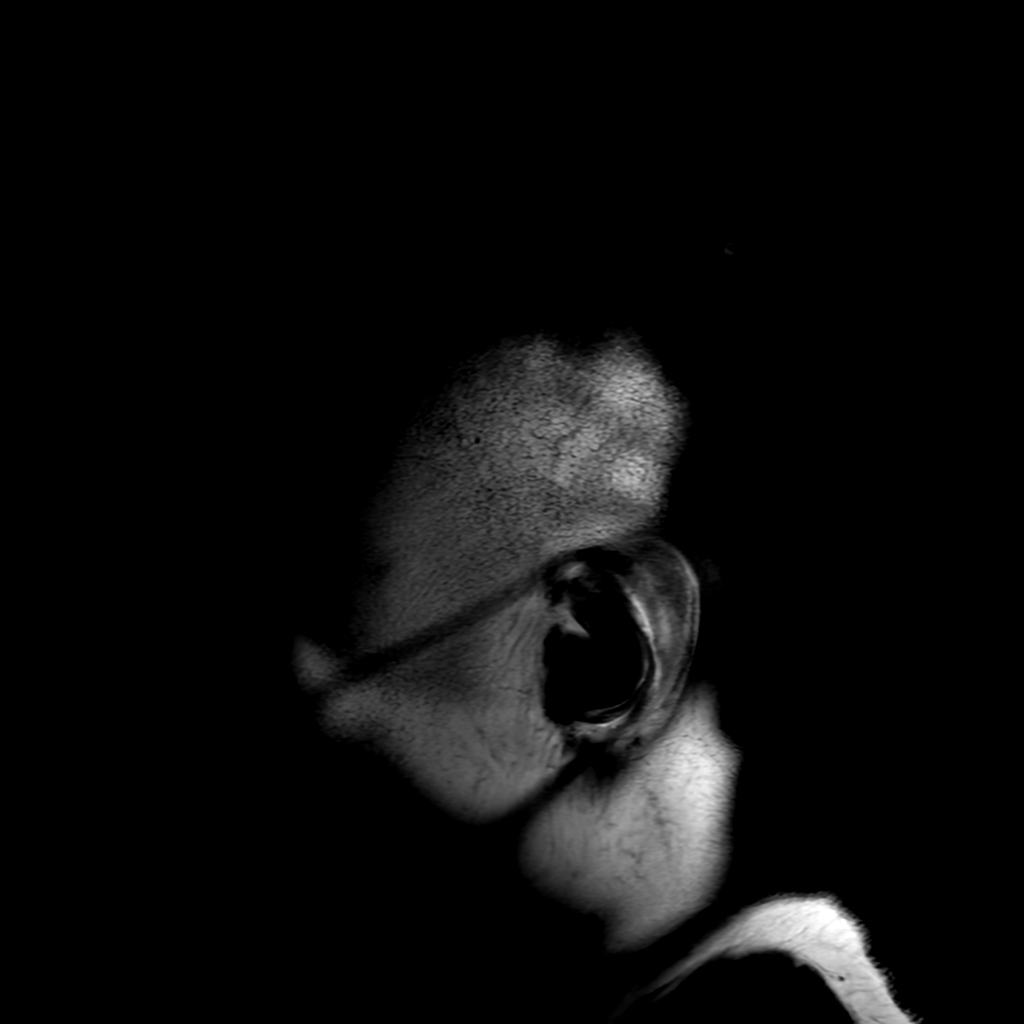

[Series 6: FLAIR · axial · 5.0mm · 0.41mm/px · z∈[-97,+50]mm · 2 of 26 slices shown (2 of 2)]
[im 1/26]
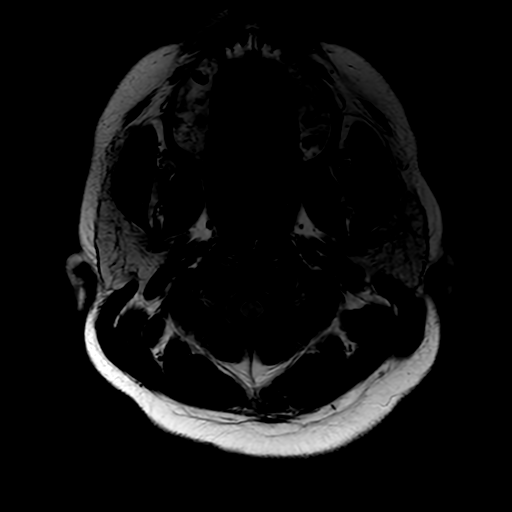
[im 26/26]
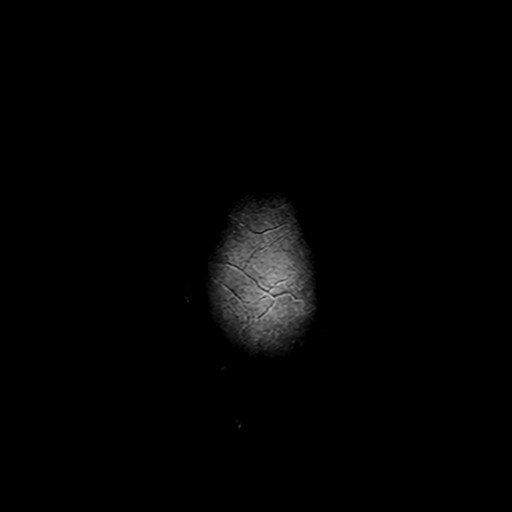

[Series 250: ADC · axial · 3.0mm · 0.94mm/px · z∈[-100,+44]mm · 4 of 50 slices shown (1 of 2)]
[im 1/50]
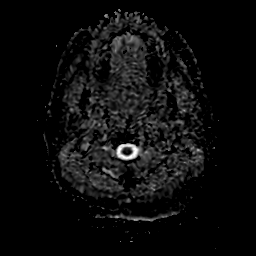
[im 17/50]
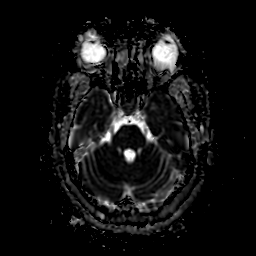
[im 33/50]
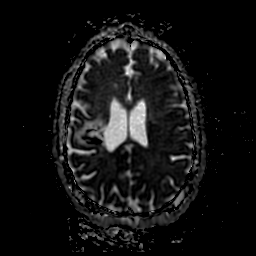
[im 50/50]
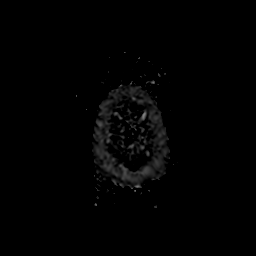

[Series 350: ADC · coronal · 4.0mm · 0.94mm/px · 3 of 37 slices shown (2 of 2)]
[im 1/37]
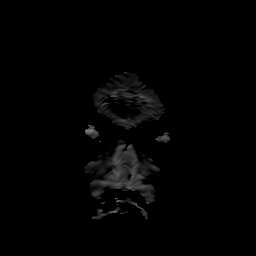
[im 19/37]
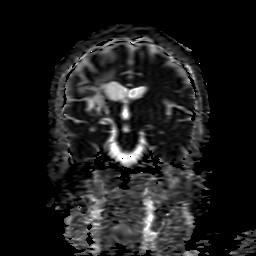
[im 37/37]
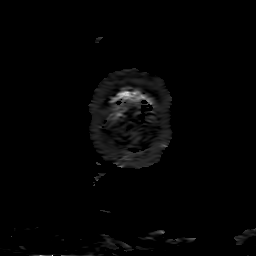

[24 of 48 positions shown; findings below may reference images not displayed]

FINDINGS: MRI HEAD FINDINGS

Brain: No acute infarct, acute hemorrhage or extra-axial collection.
Old right frontoparietal infarct with associated hemosiderin
deposition. There is mild surrounding gliosis. There are multiple
old small vessel infarcts of the left corona radiata. Mild atrophy.
No chronic microhemorrhage. Normal midline structures.

Vascular: Normal flow voids.

Skull and upper cervical spine: Normal marrow signal.

Sinuses/Orbits: Negative.

Other: None.

MRA HEAD FINDINGS

POSTERIOR CIRCULATION:

--Vertebral arteries: Normal V4 segments.

--Inferior cerebellar arteries: Normal.

--Basilar artery: Normal.

--Superior cerebellar arteries: Normal.

--Posterior cerebral arteries: Normal.

ANTERIOR CIRCULATION:

--Intracranial internal carotid arteries: Normal.

--Anterior cerebral arteries (ACA): Normal. Both A1 segments are
present. Patent anterior communicating artery (a-comm).

--Middle cerebral arteries (MCA): Normal.

MRA NECK FINDINGS

Normal carotid and vertebral arteries.
IMPRESSION: 1. No acute intracranial abnormality.
2. Old right frontoparietal infarct and multiple old small vessel
infarcts of the left corona radiata.
3. Normal MRA of the head and neck.

## 2020-08-31 IMAGING — MR MR MRA HEAD W/O CM
2 series · 19 of 48 positions shown · non-contrast
Comparison: None.

CLINICAL DATA: Multiple falls. Left hemiplegia. New onset left eye
droop



[Series 2: ax (id) · axial · 1.0mm · 0.43mm/px · z∈[-96,-4]mm · 18 of 200 slices shown]
[im 1/200]
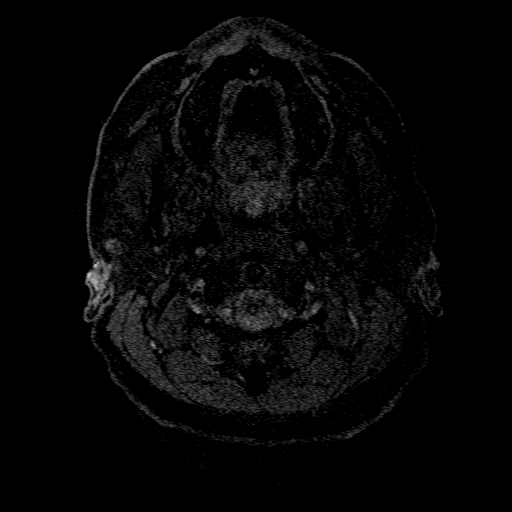
[im 5/200]
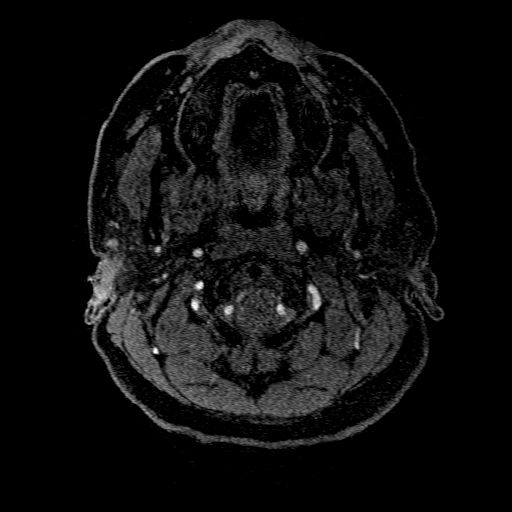
[im 9/200]
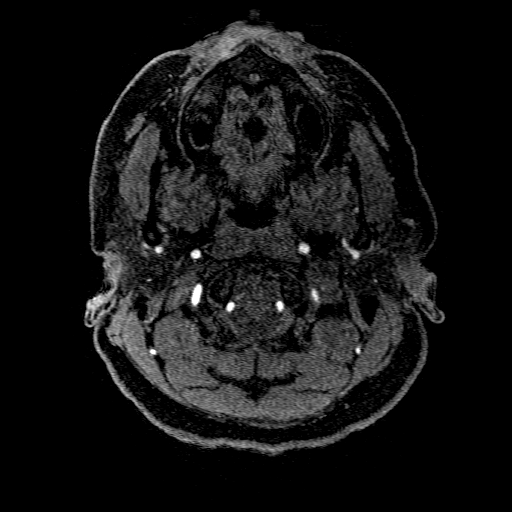
[im 13/200]
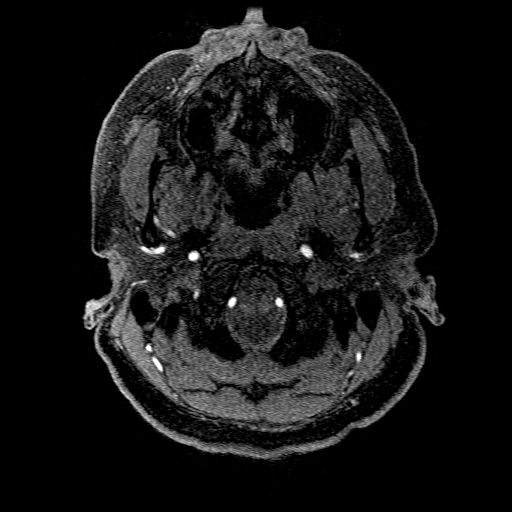
[im 18/200]
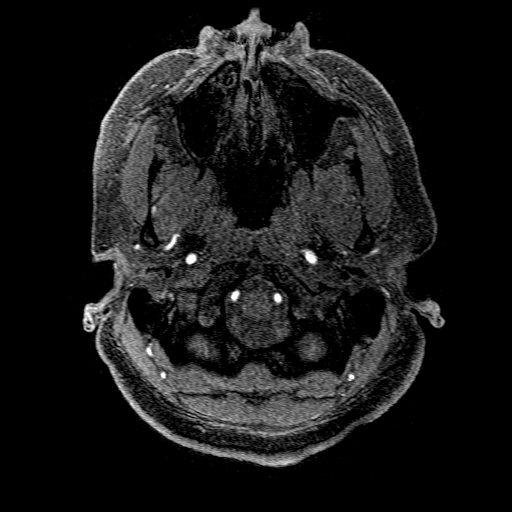
[im 22/200]
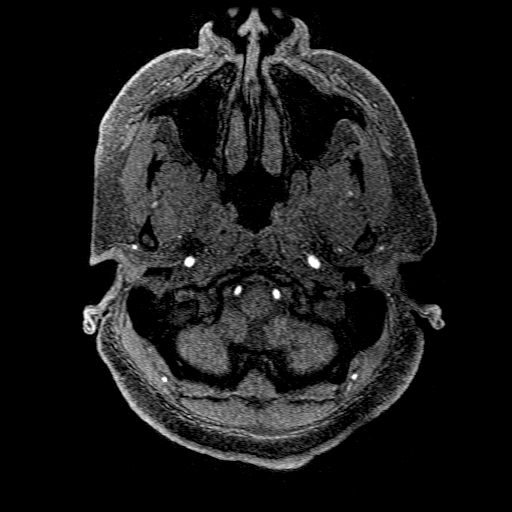
[im 26/200]
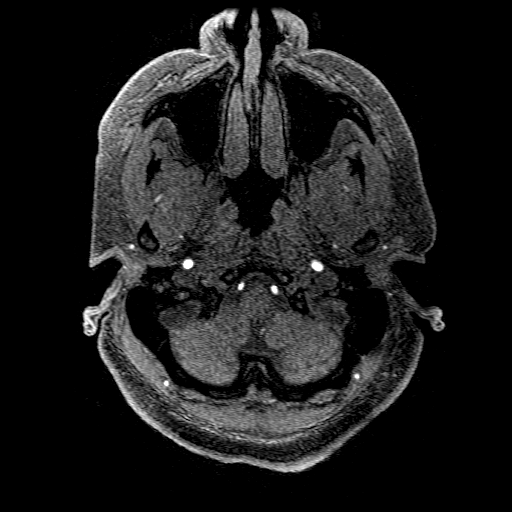
[im 31/200]
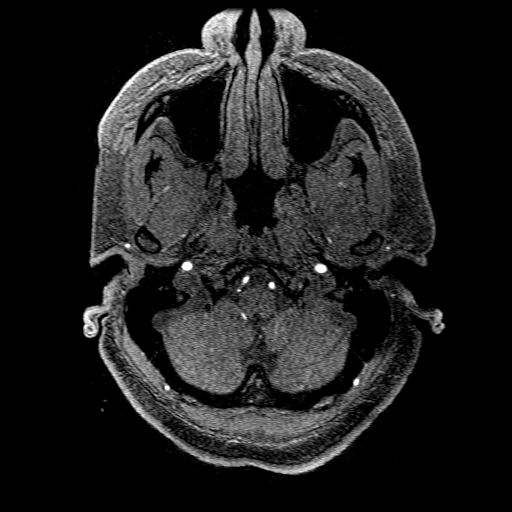
[im 35/200]
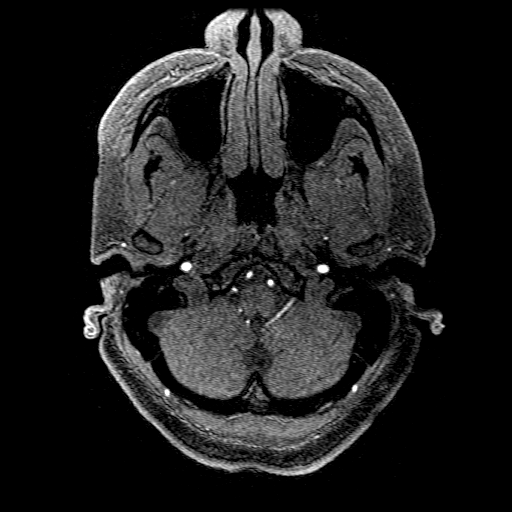
[im 39/200]
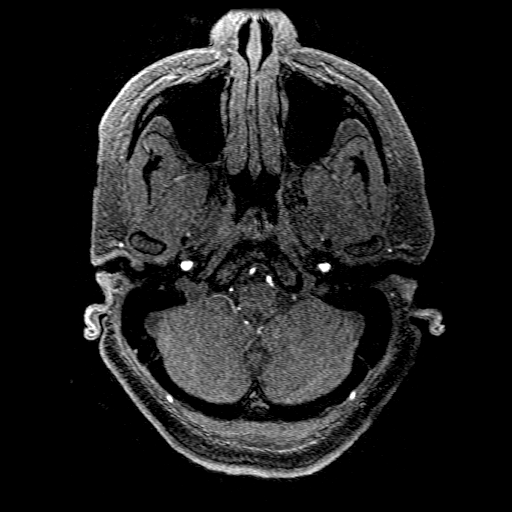
[im 61/200]
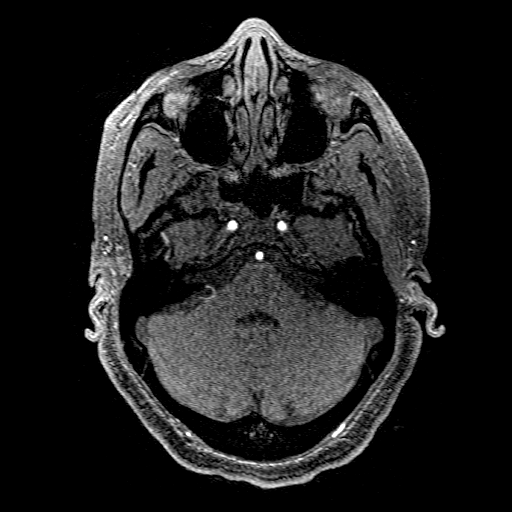
[im 87/200]
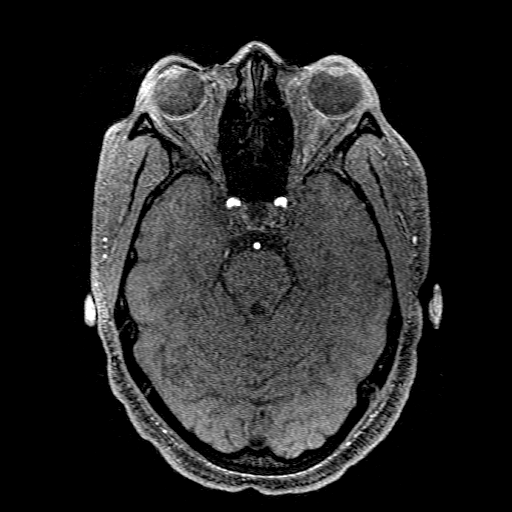
[im 100/200]
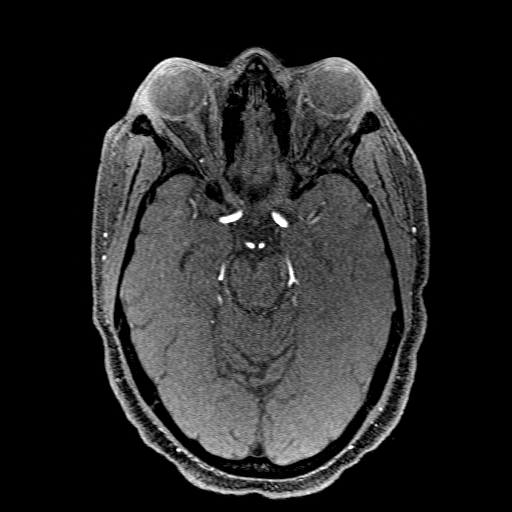
[im 113/200]
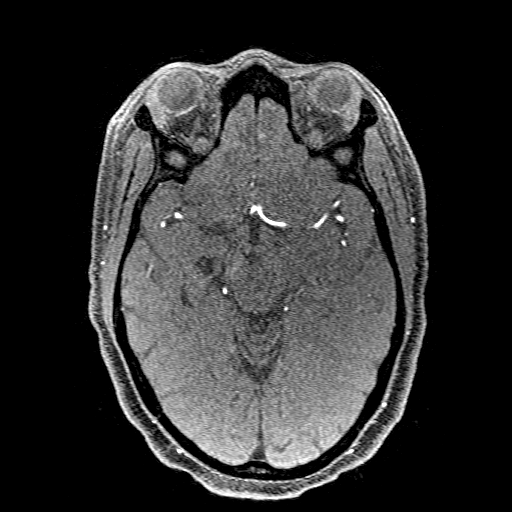
[im 139/200]
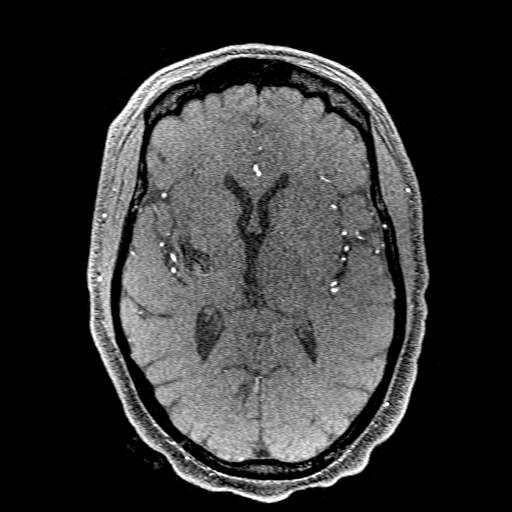
[im 165/200]
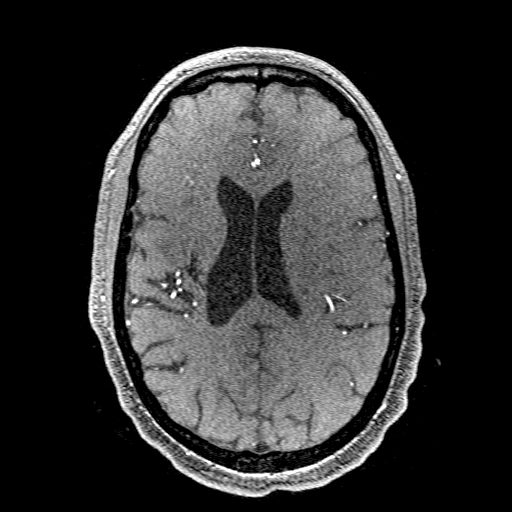
[im 169/200]
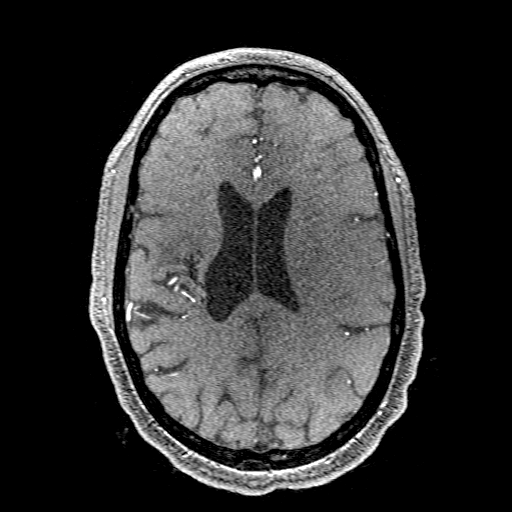
[im 191/200]
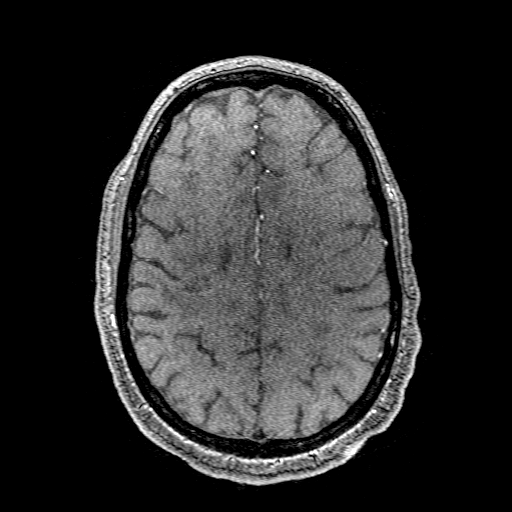

[Series 201: pjn:ax (id) · sagittal · 1.0mm · 0.43mm/px · 1 of 4 slices shown]
[im 1/4]
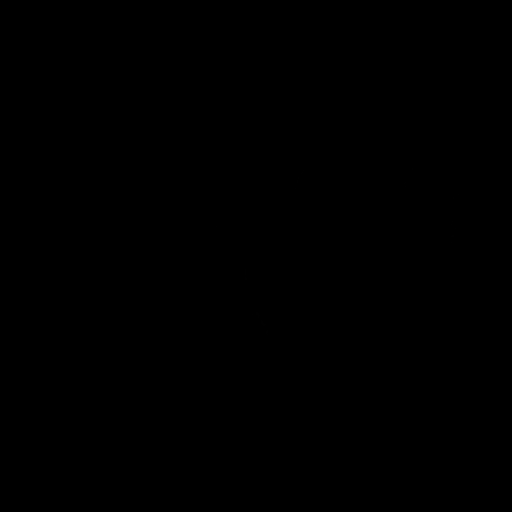

[19 of 48 positions shown; findings below may reference images not displayed]

FINDINGS: MRI HEAD FINDINGS

Brain: No acute infarct, acute hemorrhage or extra-axial collection.
Old right frontoparietal infarct with associated hemosiderin
deposition. There is mild surrounding gliosis. There are multiple
old small vessel infarcts of the left corona radiata. Mild atrophy.
No chronic microhemorrhage. Normal midline structures.

Vascular: Normal flow voids.

Skull and upper cervical spine: Normal marrow signal.

Sinuses/Orbits: Negative.

Other: None.

MRA HEAD FINDINGS

POSTERIOR CIRCULATION:

--Vertebral arteries: Normal V4 segments.

--Inferior cerebellar arteries: Normal.

--Basilar artery: Normal.

--Superior cerebellar arteries: Normal.

--Posterior cerebral arteries: Normal.

ANTERIOR CIRCULATION:

--Intracranial internal carotid arteries: Normal.

--Anterior cerebral arteries (ACA): Normal. Both A1 segments are
present. Patent anterior communicating artery (a-comm).

--Middle cerebral arteries (MCA): Normal.

MRA NECK FINDINGS

Normal carotid and vertebral arteries.
IMPRESSION: 1. No acute intracranial abnormality.
2. Old right frontoparietal infarct and multiple old small vessel
infarcts of the left corona radiata.
3. Normal MRA of the head and neck.

## 2020-08-31 NOTE — Plan of Care (Signed)
  Problem: Education: Goal: Knowledge of General Education information will improve Description: Including pain rating scale, medication(s)/side effects and non-pharmacologic comfort measures Outcome: Adequate for Discharge   Problem: Health Behavior/Discharge Planning: Goal: Ability to manage health-related needs will improve Outcome: Adequate for Discharge   Problem: Safety: Goal: Ability to remain free from injury will improve Outcome: Adequate for Discharge   Problem: Skin Integrity: Goal: Risk for impaired skin integrity will decrease Outcome: Adequate for Discharge

## 2020-08-31 NOTE — Progress Notes (Signed)
Patient being discharged.  Patient to be transported by her spouse.  IV removed with the catheter intact.  Discharge instructions and prescription information given to the patient who verbalized understanding.

## 2020-08-31 NOTE — Care Management (Signed)
Referral placed for outpatient PT.

## 2020-08-31 NOTE — Discharge Summary (Signed)
Physician Discharge Summary  ARYELLA BESECKER BMW:[id number] DOB: 09-Sep-1971 DOA: 08/30/2020  PCP: Colon Branch, MD  Admit date: 08/30/2020 Discharge date: 08/31/2020  Admitted From: Observation Disposition: home  Recommendations for Outpatient Follow-up:  1. Follow up with PCP in 1-2 weeks   Home Health:No Equipment/Devices:no new equipment  Discharge Condition:Stable CODE STATUS:Full code Diet recommendation: reume pre=admission diet  Brief/Interim Summary: DNYA HICKLE is a 49 y.o. female with medical history significant for intracranial hemorrhage, residual left hemiplegia, hypertension. Patient presented to the ED with complaints of 2 falls, first fall was 4 days ago, and a second fall last night-Thursday.  And new left eye droop noticed by sister-in-law, and god- son when she was video chatting earlier today.  Spouse also increase the patient's left is a bit droopy. Patient is not sure exactly why she fell both times, but she was using her cane which she uses to ambulate with at baseline.   Patient has residual deficits from her prior stroke.she tells me she has some mild droop involving the left side of her face, she is also unable to move or use her left upper extremity, she has some weakness in her left lower extremity, and visual field defects involving her left eye, she also has baseline tingling involving her left upper and lower extremity.  Patient reports compliance with Plavix daily.  HOSPITAL COURSE: Right Eye droop- subtle on neurologic exam, more of left eye being on a slightly lower level than right. Patient has chronic left-sided facial droop, that is unchanged.   -CT HEAD/MRI/MRA all negative -f/up echocardiogram -Continue Plavix, Lipitor -Neuro consult pending-anticipate pt will be stable for d/c -Passed bedside swallow evaluation -Hold antihypertensives lisinopril and carvedilol and Lasix for now to allow for permissive hypertension.  History of  intracranial hemorrhage, with poststroke hemiplegia- with significant residual deficits include left facial droop, dense left upper extremity paresis, left lower extremity weakness, left visual field deficits, left uppercase lower extremity paresthesias.  Spouse is primary caregiver.  Ambulates with cane at baseline.  Reports compliance with Plavix. -Resume Plavix, statin  Mechanical falls-high risk for falls with visual field defects and left-sided hemiparesis. -PT evaluation  Hypertension- Stable -lisinopril, carvedilol, Lasix held for now in case of possible stroke, to allow for permissive hypertension  Discharge Diagnoses:  Principal Problem:   Facial droop Active Problems:   Essential hypertension   ICH (intracerebral hemorrhage) (HCC) - R basal ganglia due to hypertensive emergency   Hemiplegia, post-stroke (HCC)   Spastic hemiplegia of left nondominant side due to nontraumatic intraparenchymal hemorrhage of brain [facility])    Discharge Instructions  Discharge Instructions    Call MD for:  difficulty breathing, headache or visual disturbances   Complete by: As directed    Call MD for:  extreme fatigue   Complete by: As directed    Call MD for:  persistant dizziness or light-headedness   Complete by: As directed    Call MD for:  severe uncontrolled pain   Complete by: As directed    Call MD for:  temperature >100.4   Complete by: As directed    Diet - low sodium heart healthy   Complete by: As directed    Increase activity slowly   Complete by: As directed      Allergies as of 08/31/2020      Reactions   Bee Venom Anaphylaxis   Peanut-containing Drug Products Anaphylaxis   Hydrocodone Hives   Generalize itching w/o rash   Isovue [iopamidol] Hives, Itching  Pt broke out with one facial hive.  Had itching on her right upper ant and posterior arm w/o evidence of hives.  Pt given water and 25mg  benadryl po.  We observed pt for 30 minutes before d/c.  J. Bohm     Ambien  [zolpidem Tartrate] Other (See Comments)   forgetfulness   Aspirin Swelling   Reports blisters w/ ASA but pt reports is ok w/ Motrin, Advil, naproxen   Latex Hives      Medication List    TAKE these medications   atorvastatin 20 MG tablet Commonly known as: LIPITOR Take 1 tablet (20 mg total) by mouth daily at 6 PM.   baclofen 20 MG tablet Commonly known as: LIORESAL TAKE 1 TABLET BY MOUTH THREE TIMES A DAY   buPROPion 100 MG tablet Commonly known as: WELLBUTRIN Take 1 tablet (100 mg total) by mouth 2 (two) times daily.   carvedilol 6.25 MG tablet Commonly known as: COREG Take 1 tablet (6.25 mg total) by mouth 2 (two) times daily with a meal.   cephALEXin 500 MG capsule Commonly known as: KEFLEX Take 1 capsule (500 mg total) by mouth 3 (three) times daily.   clopidogrel 75 MG tablet Commonly known as: PLAVIX Take 1 tablet (75 mg total) by mouth daily.   escitalopram 20 MG tablet Commonly known as: LEXAPRO Take 1 tablet (20 mg total) by mouth daily.   furosemide 20 MG tablet Commonly known as: LASIX Take 1 tablet (20 mg total) by mouth daily.   gabapentin 300 MG capsule Commonly known as: NEURONTIN Take 1 capsule (300 mg total) by mouth 2 (two) times daily.   lisinopril 2.5 MG tablet Commonly known as: ZESTRIL Take 1 tablet (2.5 mg total) by mouth daily.   traZODone 100 MG tablet Commonly known as: DESYREL Take 1 tablet (100 mg total) by mouth at bedtime as needed for sleep.   VITAMIN B 12 PO Take 1 tablet by mouth daily.       Allergies  Allergen Reactions  . Bee Venom Anaphylaxis  . Peanut-Containing Drug Products Anaphylaxis  . Hydrocodone Hives    Generalize itching w/o rash  . Isovue [Iopamidol] Hives and Itching    Pt broke out with one facial hive.  Had itching on her right upper ant and posterior arm w/o evidence of hives.  Pt given water and 25mg  benadryl po.  We observed pt for 30 minutes before d/c.  J. Bohm    . Ambien [Zolpidem Tartrate]  Other (See Comments)    forgetfulness  . Aspirin Swelling    Reports blisters w/ ASA but pt reports is ok w/ Motrin, Advil, naproxen  . Latex Hives    Consultations:  neuro   Procedures/Studies: DG Ribs Unilateral W/Chest Left  Result Date: 08/30/2020 CLINICAL DATA:  Status post fall. EXAM: LEFT RIBS AND CHEST - 3+ VIEW COMPARISON:  March 01, 2020 FINDINGS: A radiopaque marker was placed at the site of the patient's pain. No fracture or other bone lesions are seen involving the ribs. There is no evidence of pneumothorax or pleural effusion. Both lungs are clear. Heart size and mediastinal contours are within normal limits. IMPRESSION: Negative. Electronically Signed   By: Virgina Norfolk M.D.   On: 08/30/2020 16:08   CT Head Wo Contrast  Result Date: 08/30/2020 CLINICAL DATA:  Neuro deficit, acute, stroke suspected. Additional provided: Fall last night, left eye droop this morning, history of stroke 4 years ago with left-sided weakness. EXAM: CT HEAD WITHOUT CONTRAST  TECHNIQUE: Contiguous axial images were obtained from the base of the skull through the vertex without intravenous contrast. COMPARISON:  Prior head CT examinations 11/03/2018 and earlier. FINDINGS: Brain: Redemonstrated chronic encephalomalacia within the right frontoparietal lobes, right basal ganglia and right insula at site of prior hemorrhage. There is no acute intracranial hemorrhage. No acute demarcated cortical infarct is identified. No extra-axial fluid collection. No evidence of intracranial mass. No midline shift. Vascular: No hyperdense vessel.  Atherosclerotic calcifications. Skull: Normal. Negative for fracture or focal lesion. Sinuses/Orbits: Visualized orbits show no acute finding. Mild ethmoid sinus mucosal thickening. No significant mastoid effusion. IMPRESSION: No CT evidence of acute intracranial abnormality. Redemonstrated chronic encephalomalacia within the right frontoparietal lobes, right basal ganglia and  right insula at site of prior hemorrhage. Mild ethmoid sinus mucosal thickening. Electronically Signed   By: Kellie Simmering DO   On: 08/30/2020 16:25   MR ANGIO HEAD WO CONTRAST  Result Date: 08/31/2020 CLINICAL DATA:  Multiple falls. Left hemiplegia. New onset left eye droop EXAM: MRI HEAD WITHOUT CONTRAST MRA HEAD WITHOUT CONTRAST MRA NECK WITHOUT CONTRAST TECHNIQUE: Multiplanar, multiecho pulse sequences of the brain and surrounding structures were obtained without intravenous contrast. Angiographic images of the Circle of Willis were obtained using MRA technique without intravenous contrast. Angiographic images of the neck were obtained using MRA technique without intravenous contrast. Carotid stenosis measurements (when applicable) are obtained utilizing NASCET criteria, using the distal internal carotid diameter as the denominator. COMPARISON:  None. FINDINGS: MRI HEAD FINDINGS Brain: No acute infarct, acute hemorrhage or extra-axial collection. Old right frontoparietal infarct with associated hemosiderin deposition. There is mild surrounding gliosis. There are multiple old small vessel infarcts of the left corona radiata. Mild atrophy. No chronic microhemorrhage. Normal midline structures. Vascular: Normal flow voids. Skull and upper cervical spine: Normal marrow signal. Sinuses/Orbits: Negative. Other: None. MRA HEAD FINDINGS POSTERIOR CIRCULATION: --Vertebral arteries: Normal V4 segments. --Inferior cerebellar arteries: Normal. --Basilar artery: Normal. --Superior cerebellar arteries: Normal. --Posterior cerebral arteries: Normal. ANTERIOR CIRCULATION: --Intracranial internal carotid arteries: Normal. --Anterior cerebral arteries (ACA): Normal. Both A1 segments are present. Patent anterior communicating artery (a-comm). --Middle cerebral arteries (MCA): Normal. MRA NECK FINDINGS Normal carotid and vertebral arteries. IMPRESSION: 1. No acute intracranial abnormality. 2. Old right frontoparietal infarct and  multiple old small vessel infarcts of the left corona radiata. 3. Normal MRA of the head and neck. Electronically Signed   By: Ulyses Jarred M.D.   On: 08/31/2020 02:30   MR ANGIO NECK WO CONTRAST  Result Date: 08/31/2020 CLINICAL DATA:  Multiple falls. Left hemiplegia. New onset left eye droop EXAM: MRI HEAD WITHOUT CONTRAST MRA HEAD WITHOUT CONTRAST MRA NECK WITHOUT CONTRAST TECHNIQUE: Multiplanar, multiecho pulse sequences of the brain and surrounding structures were obtained without intravenous contrast. Angiographic images of the Circle of Willis were obtained using MRA technique without intravenous contrast. Angiographic images of the neck were obtained using MRA technique without intravenous contrast. Carotid stenosis measurements (when applicable) are obtained utilizing NASCET criteria, using the distal internal carotid diameter as the denominator. COMPARISON:  None. FINDINGS: MRI HEAD FINDINGS Brain: No acute infarct, acute hemorrhage or extra-axial collection. Old right frontoparietal infarct with associated hemosiderin deposition. There is mild surrounding gliosis. There are multiple old small vessel infarcts of the left corona radiata. Mild atrophy. No chronic microhemorrhage. Normal midline structures. Vascular: Normal flow voids. Skull and upper cervical spine: Normal marrow signal. Sinuses/Orbits: Negative. Other: None. MRA HEAD FINDINGS POSTERIOR CIRCULATION: --Vertebral arteries: Normal V4 segments. --Inferior cerebellar arteries: Normal. --Basilar artery:  Normal. --Superior cerebellar arteries: Normal. --Posterior cerebral arteries: Normal. ANTERIOR CIRCULATION: --Intracranial internal carotid arteries: Normal. --Anterior cerebral arteries (ACA): Normal. Both A1 segments are present. Patent anterior communicating artery (a-comm). --Middle cerebral arteries (MCA): Normal. MRA NECK FINDINGS Normal carotid and vertebral arteries. IMPRESSION: 1. No acute intracranial abnormality. 2. Old right  frontoparietal infarct and multiple old small vessel infarcts of the left corona radiata. 3. Normal MRA of the head and neck. Electronically Signed   By: Ulyses Jarred M.D.   On: 08/31/2020 02:30   MR BRAIN WO CONTRAST  Result Date: 08/31/2020 CLINICAL DATA:  Multiple falls. Left hemiplegia. New onset left eye droop EXAM: MRI HEAD WITHOUT CONTRAST MRA HEAD WITHOUT CONTRAST MRA NECK WITHOUT CONTRAST TECHNIQUE: Multiplanar, multiecho pulse sequences of the brain and surrounding structures were obtained without intravenous contrast. Angiographic images of the Circle of Willis were obtained using MRA technique without intravenous contrast. Angiographic images of the neck were obtained using MRA technique without intravenous contrast. Carotid stenosis measurements (when applicable) are obtained utilizing NASCET criteria, using the distal internal carotid diameter as the denominator. COMPARISON:  None. FINDINGS: MRI HEAD FINDINGS Brain: No acute infarct, acute hemorrhage or extra-axial collection. Old right frontoparietal infarct with associated hemosiderin deposition. There is mild surrounding gliosis. There are multiple old small vessel infarcts of the left corona radiata. Mild atrophy. No chronic microhemorrhage. Normal midline structures. Vascular: Normal flow voids. Skull and upper cervical spine: Normal marrow signal. Sinuses/Orbits: Negative. Other: None. MRA HEAD FINDINGS POSTERIOR CIRCULATION: --Vertebral arteries: Normal V4 segments. --Inferior cerebellar arteries: Normal. --Basilar artery: Normal. --Superior cerebellar arteries: Normal. --Posterior cerebral arteries: Normal. ANTERIOR CIRCULATION: --Intracranial internal carotid arteries: Normal. --Anterior cerebral arteries (ACA): Normal. Both A1 segments are present. Patent anterior communicating artery (a-comm). --Middle cerebral arteries (MCA): Normal. MRA NECK FINDINGS Normal carotid and vertebral arteries. IMPRESSION: 1. No acute intracranial  abnormality. 2. Old right frontoparietal infarct and multiple old small vessel infarcts of the left corona radiata. 3. Normal MRA of the head and neck. Electronically Signed   By: Ulyses Jarred M.D.   On: 08/31/2020 02:30   DG Humerus Left  Result Date: 08/30/2020 CLINICAL DATA:  Status post fall. EXAM: LEFT HUMERUS - 2+ VIEW COMPARISON:  None. FINDINGS: There is no evidence of fracture or other focal bone lesions. Soft tissues are unremarkable. IMPRESSION: Negative. Electronically Signed   By: Virgina Norfolk M.D.   On: 08/30/2020 16:06   ECHOCARDIOGRAM COMPLETE  Result Date: 08/31/2020    ECHOCARDIOGRAM REPORT   Patient Name:   ROGENIA WERNTZ Date of Exam: 08/31/2020 Medical Rec #:  [id number]          Height:       59.0 in Accession #:    [id number]         Weight:       165.0 lb Date of Birth:  [birth year]         BSA:          1.700 m Patient Age:    20 years           BP:           134/84 mmHg Patient Gender: F                  HR:           73 bpm. Exam Location:  Inpatient Procedure: 2D Echo, Cardiac Doppler and Color Doppler Indications:    Stroke 434.91 / I163.9  History:  Patient has prior history of Echocardiogram examinations, most                 recent 04/03/2016. Stroke; Risk Factors:Hypertension and                 Dyslipidemia.  Sonographer:    Bernadene Person RDCS Referring Phys: [place]  1. Left ventricular ejection fraction, by estimation, is 60 to 65%. The left ventricle has normal function. The left ventricle has no regional wall motion abnormalities. Left ventricular diastolic parameters are consistent with Grade I diastolic dysfunction (impaired relaxation).  2. Right ventricular systolic function is normal. The right ventricular size is normal. Tricuspid regurgitation signal is inadequate for assessing PA pressure.  3. A small pericardial effusion is present. The pericardial effusion is circumferential. There is no evidence of cardiac tamponade.   4. The mitral valve is normal in structure. No evidence of mitral valve regurgitation. No evidence of mitral stenosis.  5. The aortic valve was not well visualized. Aortic valve regurgitation is not visualized. No aortic stenosis is present.  6. The inferior vena cava is normal in size with greater than 50% respiratory variability, suggesting right atrial pressure of 3 mmHg.  7. Possible PFO by colorflow doppler. Recommend agitated saline contrast study. FINDINGS  Left Ventricle: Left ventricular ejection fraction, by estimation, is 60 to 65%. The left ventricle has normal function. The left ventricle has no regional wall motion abnormalities. The left ventricular internal cavity size was normal in size. There is  no left ventricular hypertrophy. Left ventricular diastolic parameters are consistent with Grade I diastolic dysfunction (impaired relaxation). Normal left ventricular filling pressure. Right Ventricle: The right ventricular size is normal. No increase in right ventricular wall thickness. Right ventricular systolic function is normal. Tricuspid regurgitation signal is inadequate for assessing PA pressure. Left Atrium: Left atrial size was normal in size. Right Atrium: Right atrial size was normal in size. Pericardium: A small pericardial effusion is present. The pericardial effusion is circumferential. There is no evidence of cardiac tamponade. Mitral Valve: The mitral valve is normal in structure. No evidence of mitral valve regurgitation. No evidence of mitral valve stenosis. Tricuspid Valve: The tricuspid valve is normal in structure. Tricuspid valve regurgitation is not demonstrated. No evidence of tricuspid stenosis. Aortic Valve: The aortic valve was not well visualized. Aortic valve regurgitation is not visualized. No aortic stenosis is present. Pulmonic Valve: The pulmonic valve was normal in structure. Pulmonic valve regurgitation is not visualized. No evidence of pulmonic stenosis. Aorta: The  aortic root is normal in size and structure. Venous: The inferior vena cava is normal in size with greater than 50% respiratory variability, suggesting right atrial pressure of 3 mmHg. IAS/Shunts: Possible shunting.  LEFT VENTRICLE PLAX 2D LVIDd:         4.30 cm  Diastology LVIDs:         3.20 cm  LV e' medial:    5.59 cm/s LV PW:         0.90 cm  LV E/e' medial:  13.3 LV IVS:        1.00 cm  LV e' lateral:   6.41 cm/s LVOT diam:     2.00 cm  LV E/e' lateral: 11.6 LV SV:         65 LV SV Index:   38 LVOT Area:     3.14 cm  RIGHT VENTRICLE RV S prime:     14.70 cm/s TAPSE (M-mode): 2.5 cm LEFT ATRIUM  Index       RIGHT ATRIUM          Index LA diam:      2.70 cm 1.59 cm/m  RA Area:     7.25 cm LA Vol (A2C): 20.0 ml 11.77 ml/m RA Volume:   11.40 ml 6.71 ml/m LA Vol (A4C): 39.8 ml 23.42 ml/m  AORTIC VALVE LVOT Vmax:   93.30 cm/s LVOT Vmean:  66.700 cm/s LVOT VTI:    0.207 m  AORTA Ao Root diam: 2.80 cm MITRAL VALVE MV Area (PHT): 3.34 cm    SHUNTS MV Decel Time: 227 msec    Systemic VTI:  0.21 m MV E velocity: 74.10 cm/s  Systemic Diam: 2.00 cm MV A velocity: 81.80 cm/s MV E/A ratio:  0.91 Fransico Him MD Electronically signed by Fransico Him MD Signature Date/Time: 08/31/2020/10:57:19 AM    Final        Subjective: Pt reports doing well, eager to be discharged home  Discharge Exam: Vitals:   08/31/20 0737 08/31/20 1147  BP: 114/78 107/71  Pulse: 67 81  Resp: 18 18  Temp: (!) 97.5 F (36.4 C) 98 F (36.7 C)  SpO2: 97% 97%   Vitals:   08/31/20 0228 08/31/20 0410 08/31/20 0737 08/31/20 1147  BP: 125/82 134/84 114/78 107/71  Pulse: (!) 59 62 67 81  Resp: 16 17 18 18   Temp: 97.7 F (36.5 C) 98.9 F (37.2 C) (!) 97.5 F (36.4 C) 98 F (36.7 C)  TempSrc: Oral Axillary Oral Oral  SpO2: 99% 100% 97% 97%  Weight:      Height:        General: Pt is alert, awake, not in acute distress Cardiovascular: RRR, S1/S2 +, no rubs, no gallops Respiratory: CTA bilaterally, no wheezing,  no rhonchi Abdominal: Soft, NT, ND, bowel sounds + Extremities: no edema, no cyanosis    The results of significant diagnostics from this hospitalization (including imaging, microbiology, ancillary and laboratory) are listed below for reference.     Microbiology: Recent Results (from the past 240 hour(s))  SARS Coronavirus 2 by RT PCR (hospital order, performed in [facility] hospital lab) Nasopharyngeal Nasopharyngeal Swab     Status: None   Collection Time: 08/30/20  3:27 PM   Specimen: Nasopharyngeal Swab  Result Value Ref Range Status   SARS Coronavirus 2 NEGATIVE NEGATIVE Final    Comment: (NOTE) SARS-CoV-2 target nucleic acids are NOT DETECTED.  The SARS-CoV-2 RNA is generally detectable in upper and lower respiratory specimens during the acute phase of infection. The lowest concentration of SARS-CoV-2 viral copies this assay can detect is 250 copies / mL. A negative result does not preclude SARS-CoV-2 infection and should not be used as the sole basis for treatment or other patient management decisions.  A negative result may occur with improper specimen collection / handling, submission of specimen other than nasopharyngeal swab, presence of viral mutation(s) within the areas targeted by this assay, and inadequate number of viral copies (<250 copies / mL). A negative result must be combined with clinical observations, patient history, and epidemiological information.  Fact Sheet for Patients:   StrictlyIdeas.no  Fact Sheet for Healthcare Providers: BankingDealers.co.za  This test is not yet approved or  cleared by the [place] FDA and has been authorized for detection and/or diagnosis of SARS-CoV-2 by FDA under an Emergency Use Authorization (EUA).  This EUA will remain in effect (meaning this test can be used) for the duration of the COVID-19 declaration under Section 564(b)(1)  of the Act, 21 U.S.C. section  360bbb-3(b)(1), unless the authorization is terminated or revoked sooner.  Performed at [facility], [address] [place], [place] [address]      Labs: BNP (last 3 results) No results for input(s): BNP in the last 8760 hours. Basic Metabolic Panel: Recent Labs  Lab 08/30/20 1527  NA 137  K 3.8  CL 100  CO2 29  GLUCOSE 89  BUN 11  CREATININE 1.09*  CALCIUM 9.0   Liver Function Tests: No results for input(s): AST, ALT, ALKPHOS, BILITOT, PROT, ALBUMIN in the last 168 hours. No results for input(s): LIPASE, AMYLASE in the last 168 hours. No results for input(s): AMMONIA in the last 168 hours. CBC: Recent Labs  Lab 08/30/20 1527  WBC 4.7  NEUTROABS 2.3  HGB 12.4  HCT 39.8  MCV 97.5  PLT 186   Cardiac Enzymes: No results for input(s): CKTOTAL, CKMB, CKMBINDEX, TROPONINI in the last 168 hours. BNP: Invalid input(s): POCBNP CBG: Recent Labs  Lab 08/30/20 1622  GLUCAP 82   D-Dimer No results for input(s): DDIMER in the last 72 hours. Hgb A1c Recent Labs    08/31/20 0247  HGBA1C 6.1*   Lipid Profile Recent Labs    08/31/20 0247  CHOL 128  HDL 37*  LDLCALC 78  TRIG 65  CHOLHDL 3.5   Thyroid function studies Recent Labs    08/31/20 0247  TSH 2.010   Anemia work up No results for input(s): VITAMINB12, FOLATE, FERRITIN, TIBC, IRON, RETICCTPCT in the last 72 hours. Urinalysis    Component Value Date/Time   COLORURINE YELLOW 04/22/2016 1843   APPEARANCEUR CLEAR 04/22/2016 1843   LABSPEC 1.025 04/22/2016 1843   PHURINE 6.0 04/22/2016 1843   GLUCOSEU NEGATIVE 04/22/2016 1843   GLUCOSEU NEGATIVE 07/12/2013 0901   HGBUR NEGATIVE 04/22/2016 1843   BILIRUBINUR negative 05/20/2016 1413   KETONESUR NEGATIVE 04/22/2016 1843   PROTEINUR negative 05/20/2016 1413   PROTEINUR NEGATIVE 04/22/2016 1843   UROBILINOGEN negative 05/20/2016 1413   UROBILINOGEN 1.0 11/15/2013 2314   NITRITE negative 05/20/2016 1413   NITRITE NEGATIVE 04/22/2016  1843   LEUKOCYTESUR Negative 05/20/2016 1413   Sepsis Labs Invalid input(s): PROCALCITONIN,  WBC,  LACTICIDVEN Microbiology Recent Results (from the past 240 hour(s))  SARS Coronavirus 2 by RT PCR (hospital order, performed in [place] hospital lab) Nasopharyngeal Nasopharyngeal Swab     Status: None   Collection Time: 08/30/20  3:27 PM   Specimen: Nasopharyngeal Swab  Result Value Ref Range Status   SARS Coronavirus 2 NEGATIVE NEGATIVE Final    Comment: (NOTE) SARS-CoV-2 target nucleic acids are NOT DETECTED.  The SARS-CoV-2 RNA is generally detectable in upper and lower respiratory specimens during the acute phase of infection. The lowest concentration of SARS-CoV-2 viral copies this assay can detect is 250 copies / mL. A negative result does not preclude SARS-CoV-2 infection and should not be used as the sole basis for treatment or other patient management decisions.  A negative result may occur with improper specimen collection / handling, submission of specimen other than nasopharyngeal swab, presence of viral mutation(s) within the areas targeted by this assay, and inadequate number of viral copies (<250 copies / mL). A negative result must be combined with clinical observations, patient history, and epidemiological information.  Fact Sheet for Patients:   StrictlyIdeas.no  Fact Sheet for Healthcare Providers: BankingDealers.co.za  This test is not yet approved or  cleared by the [place] FDA and has been authorized  for detection and/or diagnosis of SARS-CoV-2 by FDA under an Emergency Use Authorization (EUA).  This EUA will remain in effect (meaning this test can be used) for the duration of the COVID-19 declaration under Section 564(b)(1) of the Act, 21 U.S.C. section 360bbb-3(b)(1), unless the authorization is terminated or revoked sooner.  Performed at [facility], [address] [place],  [place] [address]      Time coordinating discharge: Over 30 minutes  SIGNED:   Nicolette Bang, MD  [facility] 08/31/2020, 2:38 PM Pager   If 7PM-7AM, please contact night-coverage [email or web address] Password [email or web address]

## 2020-08-31 NOTE — Consult Note (Signed)
NEURO HOSPITALIST CONSULT NOTE   Requestig physician: Dr. Denton Brick   Reason for Consult: New facial droop    History obtained from:  Patient and Chart    HPI:                                                                                                                                          Abigail Campos is an 49 y.o. female with history of hemorrhagic stroke in 2017, anxiety, hypertension, hyperlipidemia and migraine. She has residual left weakness from her stroke. She has no use of her left arm and has weakness of her left leg, but is able to walk with a cane. She presented after recent falls causing aching pain in her lateral arm and left ribs, and with new left facial droop noted by family on video chat. On presentation at [facility] she denied any new weakness, slurred speech or other recent illness. She reports that she felt upon presentation she had returned to normal and shares she always has a bit of left facial droop since her stroke.   Teleneurology evaluated the patient and found the only new deficit to be left facial droop. NIHSS 9. She was outside the tPA. MRI Head, MRA Head and Neck, Echo, PT and neurology consultation were recommended.    MRI Brain and MRA Head and Neck: IMPRESSION:  1. No acute intracranial abnormality. 2. Old right frontoparietal infarct and multiple old small vessel infarcts of the left corona radiata. 3. Normal MRA of the head and neck.  Echo completed and result pending. Urinalysis Pending. CBC Normal. TSH normal. LDL 78 on statin, Lipitor 20mg . HBA1C 6.1. Plavix 75mg  daily was resumed today.   In discussion today with the patient she tells Dr. Leonel Ramsay that she had left eye droop witnessed by family, but no new facial droop. She says she has been at baseline since presentation and remains at baseline.   Past Medical History:  Diagnosis Date  . Anxiety and depression   . Essential hypertension 10/19/2007    03-2010: metoprolol changed to bystolic (was not feeling well on it, no specific allergy or reaction)    . Hemiplegia ([place])   . Hyperlipemia   . Hypertension   . Migraine   . Neuromuscular disorder ([place])    left sided hemiplegia - has brace for l leg and walks with a cane  . Ovarian cancer ([place])   . Sleep apnea    not wearing c-pap yet  . Stroke [facility]) 2017    Past Surgical History:  Procedure Laterality Date  . ABDOMINAL HYSTERECTOMY  11-14-07   no oophorectomy per surgical report  . CESAREAN SECTION     [id number]  . INGUINAL HERNIA REPAIR     2004  Family History  Problem Relation Age of Onset  . Diabetes Mother   . Hypertension Mother   . Glaucoma Mother   . Colon cancer Mother        mother age 34  . Rectal cancer Father        dx 67  . Stroke Paternal Grandfather   . Stroke Maternal Grandfather   . Diabetes Brother   . Hypertension Brother   . Heart disease Maternal Aunt        x 2 did 2012 CAD-CHF  . Diabetes Brother   . Hypertension Brother   . Lung cancer Maternal Aunt   . Breast cancer Neg Hx   . Esophageal cancer Neg Hx   . Stomach cancer Neg Hx             Social History:  reports that she has never smoked. She has never used smokeless tobacco. She reports that she does not drink alcohol and does not use drugs.  Allergies  Allergen Reactions  . Bee Venom Anaphylaxis  . Peanut-Containing Drug Products Anaphylaxis  . Hydrocodone Hives    Generalize itching w/o rash  . Isovue [Iopamidol] Hives and Itching    Pt broke out with one facial hive.  Had itching on her right upper ant and posterior arm w/o evidence of hives.  Pt given water and 25mg  benadryl po.  We observed pt for 30 minutes before d/c.  J. Bohm    . Ambien [Zolpidem Tartrate] Other (See Comments)    forgetfulness  . Aspirin Swelling    Reports blisters w/ ASA but pt reports is ok w/ Motrin, Advil, naproxen  . Latex Hives    MEDICATIONS:                                                                                                                      I have reviewed the patient's current medications.   ROS:                                                                                                                                       History obtained from the patient  General ROS: negative for - chills, fatigue, fever, weight gain or weight loss. Baseline night sweats related to menopause Psychological ROS: negative for - behavioral disorder, hallucinations, memory difficulties, mood swings or suicidal ideation. Reports baseline depression since stroke, on medication, no recent  changes.  Ophthalmic ROS: negative for - blurry vision, double vision, eye pain or loss of vision. Reports somewhat blurry if does not use glasses, unchanged from normal ENT ROS: negative for - epistaxis, nasal discharge, oral lesions, sore throat, tinnitus or vertigo Allergy and Immunology ROS: negative for - hives or itchy/watery eyes Hematological and Lymphatic ROS: negative for - bleeding problems, bruising or swollen lymph nodes Endocrine ROS: negative for -  temperature intolerance Respiratory ROS: negative for - cough, hemoptysis, shortness of breath or wheezing Cardiovascular ROS: negative for - chest pain, dyspnea on exertion, edema or irregular heartbeat Gastrointestinal ROS: negative for - abdominal pain, diarrhea, hematemesis, nausea/vomiting or stool incontinence Genito-Urinary ROS: negative for - dysuria, hematuria, incontinence or urinary frequency/urgency Musculoskeletal ROS: negative for - joint swelling or muscular weakness Neurological ROS: as noted in HPI Dermatological ROS: negative for rash and skin lesion changes   Blood pressure 107/71, pulse 81, temperature 98 F (36.7 C), temperature source Oral, resp. rate 18, height 4\' 11"  (3.976 m), weight 74.8 kg, SpO2 97 %.   General Examination:                                                                                                        Physical Exam  HEENT-  Normocephalic, no lesions, without obvious abnormality.  Normal external eye and conjunctiva.   Cardiovascular- pulses palpable throughout   Lungs- no excessive working breathing.  Saturations within normal limits Abdomen- All 4 quadrants palpated and nontender Extremities- Warm, dry and intact Musculoskeletal-no joint tenderness, deformity or swelling Skin-warm and dry, no hyperpigmentation, vitiligo, or suspicious lesions  Neurological Examination Mental Status: Alert, oriented, thought content appropriate. Able to spell WORLD both frontwards and backwards. Able to say how many quarters are in 2.75.  Speech fluent without evidence of aphasia.  Able to follow 3 step commands without difficulty. Cranial Nerves: II: Visual fields grossly normal,  III,IV, VI: ptosis not present, extra-ocular motions intact bilaterally pupils equal, round, reactive to light and accommodation V,VII: slight right facial droop, facial light touch sensation decreased left VIII: hearing normal bilaterally IX,X: uvula rises symmetrically XI: bilateral shoulder shrug XII: midline tongue extension Motor: Right : Upper extremity   5/5     Left:     Upper extremity   0/5 Right Lower extremity   5/5 Left Lower Extremity  Hip flexion 4/5, knee extension 4/5, knee flexion 3/5      Tone and bulk:normal tone throughout; no atrophy noted Sensory:  light touch decreased left arm and leg Deep Tendon Reflexes: 2+ and symmetric throughout Cerebellar: normal finger-to-nose, normal rapid alternating movements on right   Lab Results: Basic Metabolic Panel: Recent Labs  Lab 08/30/20 1527  NA 137  K 3.8  CL 100  CO2 29  GLUCOSE 89  BUN 11  CREATININE 1.09*  CALCIUM 9.0    CBC: Recent Labs  Lab 08/30/20 1527  WBC 4.7  NEUTROABS 2.3  HGB 12.4  HCT 39.8  MCV 97.5  PLT 186    Cardiac Enzymes: No results for input(s): CKTOTAL, CKMB, CKMBINDEX,  TROPONINI in the last  168 hours.  Lipid Panel: Recent Labs  Lab 08/31/20 0247  CHOL 128  TRIG 65  HDL 37*  CHOLHDL 3.5  VLDL 13  LDLCALC 78    Imaging: DG Ribs Unilateral W/Chest Left  Result Date: 08/30/2020 CLINICAL DATA:  Status post fall. EXAM: LEFT RIBS AND CHEST - 3+ VIEW COMPARISON:  March 01, 2020 FINDINGS: A radiopaque marker was placed at the site of the patient's pain. No fracture or other bone lesions are seen involving the ribs. There is no evidence of pneumothorax or pleural effusion. Both lungs are clear. Heart size and mediastinal contours are within normal limits. IMPRESSION: Negative. Electronically Signed   By: Virgina Norfolk M.D.   On: 08/30/2020 16:08   CT Head Wo Contrast  Result Date: 08/30/2020 CLINICAL DATA:  Neuro deficit, acute, stroke suspected. Additional provided: Fall last night, left eye droop this morning, history of stroke 4 years ago with left-sided weakness. EXAM: CT HEAD WITHOUT CONTRAST TECHNIQUE: Contiguous axial images were obtained from the base of the skull through the vertex without intravenous contrast. COMPARISON:  Prior head CT examinations 11/03/2018 and earlier. FINDINGS: Brain: Redemonstrated chronic encephalomalacia within the right frontoparietal lobes, right basal ganglia and right insula at site of prior hemorrhage. There is no acute intracranial hemorrhage. No acute demarcated cortical infarct is identified. No extra-axial fluid collection. No evidence of intracranial mass. No midline shift. Vascular: No hyperdense vessel.  Atherosclerotic calcifications. Skull: Normal. Negative for fracture or focal lesion. Sinuses/Orbits: Visualized orbits show no acute finding. Mild ethmoid sinus mucosal thickening. No significant mastoid effusion. IMPRESSION: No CT evidence of acute intracranial abnormality. Redemonstrated chronic encephalomalacia within the right frontoparietal lobes, right basal ganglia and right insula at site of prior hemorrhage. Mild ethmoid sinus  mucosal thickening. Electronically Signed   By: Kellie Simmering DO   On: 08/30/2020 16:25   MR ANGIO HEAD WO CONTRAST  Result Date: 08/31/2020 CLINICAL DATA:  Multiple falls. Left hemiplegia. New onset left eye droop EXAM: MRI HEAD WITHOUT CONTRAST MRA HEAD WITHOUT CONTRAST MRA NECK WITHOUT CONTRAST TECHNIQUE: Multiplanar, multiecho pulse sequences of the brain and surrounding structures were obtained without intravenous contrast. Angiographic images of the Circle of Willis were obtained using MRA technique without intravenous contrast. Angiographic images of the neck were obtained using MRA technique without intravenous contrast. Carotid stenosis measurements (when applicable) are obtained utilizing NASCET criteria, using the distal internal carotid diameter as the denominator. COMPARISON:  None. FINDINGS: MRI HEAD FINDINGS Brain: No acute infarct, acute hemorrhage or extra-axial collection. Old right frontoparietal infarct with associated hemosiderin deposition. There is mild surrounding gliosis. There are multiple old small vessel infarcts of the left corona radiata. Mild atrophy. No chronic microhemorrhage. Normal midline structures. Vascular: Normal flow voids. Skull and upper cervical spine: Normal marrow signal. Sinuses/Orbits: Negative. Other: None. MRA HEAD FINDINGS POSTERIOR CIRCULATION: --Vertebral arteries: Normal V4 segments. --Inferior cerebellar arteries: Normal. --Basilar artery: Normal. --Superior cerebellar arteries: Normal. --Posterior cerebral arteries: Normal. ANTERIOR CIRCULATION: --Intracranial internal carotid arteries: Normal. --Anterior cerebral arteries (ACA): Normal. Both A1 segments are present. Patent anterior communicating artery (a-comm). --Middle cerebral arteries (MCA): Normal. MRA NECK FINDINGS Normal carotid and vertebral arteries. IMPRESSION: 1. No acute intracranial abnormality. 2. Old right frontoparietal infarct and multiple old small vessel infarcts of the left corona  radiata. 3. Normal MRA of the head and neck. Electronically Signed   By: Ulyses Jarred M.D.   On: 08/31/2020 02:30   MR ANGIO NECK WO CONTRAST  Result Date:  08/31/2020 CLINICAL DATA:  Multiple falls. Left hemiplegia. New onset left eye droop EXAM: MRI HEAD WITHOUT CONTRAST MRA HEAD WITHOUT CONTRAST MRA NECK WITHOUT CONTRAST TECHNIQUE: Multiplanar, multiecho pulse sequences of the brain and surrounding structures were obtained without intravenous contrast. Angiographic images of the Circle of Willis were obtained using MRA technique without intravenous contrast. Angiographic images of the neck were obtained using MRA technique without intravenous contrast. Carotid stenosis measurements (when applicable) are obtained utilizing NASCET criteria, using the distal internal carotid diameter as the denominator. COMPARISON:  None. FINDINGS: MRI HEAD FINDINGS Brain: No acute infarct, acute hemorrhage or extra-axial collection. Old right frontoparietal infarct with associated hemosiderin deposition. There is mild surrounding gliosis. There are multiple old small vessel infarcts of the left corona radiata. Mild atrophy. No chronic microhemorrhage. Normal midline structures. Vascular: Normal flow voids. Skull and upper cervical spine: Normal marrow signal. Sinuses/Orbits: Negative. Other: None. MRA HEAD FINDINGS POSTERIOR CIRCULATION: --Vertebral arteries: Normal V4 segments. --Inferior cerebellar arteries: Normal. --Basilar artery: Normal. --Superior cerebellar arteries: Normal. --Posterior cerebral arteries: Normal. ANTERIOR CIRCULATION: --Intracranial internal carotid arteries: Normal. --Anterior cerebral arteries (ACA): Normal. Both A1 segments are present. Patent anterior communicating artery (a-comm). --Middle cerebral arteries (MCA): Normal. MRA NECK FINDINGS Normal carotid and vertebral arteries. IMPRESSION: 1. No acute intracranial abnormality. 2. Old right frontoparietal infarct and multiple old small vessel  infarcts of the left corona radiata. 3. Normal MRA of the head and neck. Electronically Signed   By: Ulyses Jarred M.D.   On: 08/31/2020 02:30   MR BRAIN WO CONTRAST  Result Date: 08/31/2020 CLINICAL DATA:  Multiple falls. Left hemiplegia. New onset left eye droop EXAM: MRI HEAD WITHOUT CONTRAST MRA HEAD WITHOUT CONTRAST MRA NECK WITHOUT CONTRAST TECHNIQUE: Multiplanar, multiecho pulse sequences of the brain and surrounding structures were obtained without intravenous contrast. Angiographic images of the Circle of Willis were obtained using MRA technique without intravenous contrast. Angiographic images of the neck were obtained using MRA technique without intravenous contrast. Carotid stenosis measurements (when applicable) are obtained utilizing NASCET criteria, using the distal internal carotid diameter as the denominator. COMPARISON:  None. FINDINGS: MRI HEAD FINDINGS Brain: No acute infarct, acute hemorrhage or extra-axial collection. Old right frontoparietal infarct with associated hemosiderin deposition. There is mild surrounding gliosis. There are multiple old small vessel infarcts of the left corona radiata. Mild atrophy. No chronic microhemorrhage. Normal midline structures. Vascular: Normal flow voids. Skull and upper cervical spine: Normal marrow signal. Sinuses/Orbits: Negative. Other: None. MRA HEAD FINDINGS POSTERIOR CIRCULATION: --Vertebral arteries: Normal V4 segments. --Inferior cerebellar arteries: Normal. --Basilar artery: Normal. --Superior cerebellar arteries: Normal. --Posterior cerebral arteries: Normal. ANTERIOR CIRCULATION: --Intracranial internal carotid arteries: Normal. --Anterior cerebral arteries (ACA): Normal. Both A1 segments are present. Patent anterior communicating artery (a-comm). --Middle cerebral arteries (MCA): Normal. MRA NECK FINDINGS Normal carotid and vertebral arteries. IMPRESSION: 1. No acute intracranial abnormality. 2. Old right frontoparietal infarct and multiple  old small vessel infarcts of the left corona radiata. 3. Normal MRA of the head and neck. Electronically Signed   By: Ulyses Jarred M.D.   On: 08/31/2020 02:30   DG Humerus Left  Result Date: 08/30/2020 CLINICAL DATA:  Status post fall. EXAM: LEFT HUMERUS - 2+ VIEW COMPARISON:  None. FINDINGS: There is no evidence of fracture or other focal bone lesions. Soft tissues are unremarkable. IMPRESSION: Negative. Electronically Signed   By: Virgina Norfolk M.D.   On: 08/30/2020 16:06   ECHOCARDIOGRAM COMPLETE  Result Date: 08/31/2020    ECHOCARDIOGRAM REPORT   Patient Name:  Abigail Campos Date of Exam: 08/31/2020 Medical Rec #:  [id number]          Height:       59.0 in Accession #:    [id number]         Weight:       165.0 lb Date of Birth:  1971-07-03         BSA:          1.700 m Patient Age:    35 years           BP:           134/84 mmHg Patient Gender: F                  HR:           73 bpm. Exam Location:  Inpatient Procedure: 2D Echo, Cardiac Doppler and Color Doppler Indications:    Stroke 434.91 / I163.9  History:        Patient has prior history of Echocardiogram examinations, most                 recent 04/03/2016. Stroke; Risk Factors:Hypertension and                 Dyslipidemia.  Sonographer:    Bernadene Person RDCS Referring Phys: [place]  1. Left ventricular ejection fraction, by estimation, is 60 to 65%. The left ventricle has normal function. The left ventricle has no regional wall motion abnormalities. Left ventricular diastolic parameters are consistent with Grade I diastolic dysfunction (impaired relaxation).  2. Right ventricular systolic function is normal. The right ventricular size is normal. Tricuspid regurgitation signal is inadequate for assessing PA pressure.  3. A small pericardial effusion is present. The pericardial effusion is circumferential. There is no evidence of cardiac tamponade.  4. The mitral valve is normal in structure. No evidence of  mitral valve regurgitation. No evidence of mitral stenosis.  5. The aortic valve was not well visualized. Aortic valve regurgitation is not visualized. No aortic stenosis is present.  6. The inferior vena cava is normal in size with greater than 50% respiratory variability, suggesting right atrial pressure of 3 mmHg.  7. Possible PFO by colorflow doppler. Recommend agitated saline contrast study. FINDINGS  Left Ventricle: Left ventricular ejection fraction, by estimation, is 60 to 65%. The left ventricle has normal function. The left ventricle has no regional wall motion abnormalities. The left ventricular internal cavity size was normal in size. There is  no left ventricular hypertrophy. Left ventricular diastolic parameters are consistent with Grade I diastolic dysfunction (impaired relaxation). Normal left ventricular filling pressure. Right Ventricle: The right ventricular size is normal. No increase in right ventricular wall thickness. Right ventricular systolic function is normal. Tricuspid regurgitation signal is inadequate for assessing PA pressure. Left Atrium: Left atrial size was normal in size. Right Atrium: Right atrial size was normal in size. Pericardium: A small pericardial effusion is present. The pericardial effusion is circumferential. There is no evidence of cardiac tamponade. Mitral Valve: The mitral valve is normal in structure. No evidence of mitral valve regurgitation. No evidence of mitral valve stenosis. Tricuspid Valve: The tricuspid valve is normal in structure. Tricuspid valve regurgitation is not demonstrated. No evidence of tricuspid stenosis. Aortic Valve: The aortic valve was not well visualized. Aortic valve regurgitation is not visualized. No aortic stenosis is present. Pulmonic Valve: The pulmonic valve was normal in structure. Pulmonic valve regurgitation is not visualized. No evidence  of pulmonic stenosis. Aorta: The aortic root is normal in size and structure. Venous: The  inferior vena cava is normal in size with greater than 50% respiratory variability, suggesting right atrial pressure of 3 mmHg. IAS/Shunts: Possible shunting.  LEFT VENTRICLE PLAX 2D LVIDd:         4.30 cm  Diastology LVIDs:         3.20 cm  LV e' medial:    5.59 cm/s LV PW:         0.90 cm  LV E/e' medial:  13.3 LV IVS:        1.00 cm  LV e' lateral:   6.41 cm/s LVOT diam:     2.00 cm  LV E/e' lateral: 11.6 LV SV:         65 LV SV Index:   38 LVOT Area:     3.14 cm  RIGHT VENTRICLE RV S prime:     14.70 cm/s TAPSE (M-mode): 2.5 cm LEFT ATRIUM           Index       RIGHT ATRIUM          Index LA diam:      2.70 cm 1.59 cm/m  RA Area:     7.25 cm LA Vol (A2C): 20.0 ml 11.77 ml/m RA Volume:   11.40 ml 6.71 ml/m LA Vol (A4C): 39.8 ml 23.42 ml/m  AORTIC VALVE LVOT Vmax:   93.30 cm/s LVOT Vmean:  66.700 cm/s LVOT VTI:    0.207 m  AORTA Ao Root diam: 2.80 cm MITRAL VALVE MV Area (PHT): 3.34 cm    SHUNTS MV Decel Time: 227 msec    Systemic VTI:  0.21 m MV E velocity: 74.10 cm/s  Systemic Diam: 2.00 cm MV A velocity: 81.80 cm/s MV E/A ratio:  0.91 Fransico Him MD Electronically signed by Fransico Him MD Signature Date/Time: 08/31/2020/10:57:19 AM    Final     Assessment: Abigail Campos is an 49 y.o. female with history of hemorrhagic stroke in 2017, anxiety, hypertension, hyperlipidemia and migraine. Baseline decreased sensation left side, with slight left facial droop, no movement left arm, left leg weakness, but able to ambulate with a cane. Presented after multiple family members over video chat thought she had new eye droop. She felt she was returned to baseline upon presentation to the hospital and she remains at baseline now.   MRI Brain and MRA Head and Neck: IMPRESSION:  1. No acute intracranial abnormality. 2. Old right frontoparietal infarct and multiple old small vessel infarcts of the left corona radiata. 3. Normal MRA of the head and neck.  Echo completed and result pending. Urinalysis  Pending. CBC Normal. TSH normal. LDL 78 on statin, Lipitor 20mg . HBA1C 6.1. Plavix 75mg  daily was resumed today  Impression: Episode of isolated new left eye drooping observed by family on video chat, she clarified with Dr. Leonel Ramsay that she never had new facial weakness, only her baseline facial weakness. She reports it was resolved by the time of presentation and that she remains now at baseline. MRI Negative for acute findings.   Recommendations: Isolated episode of left eye drooping is unlikely a TIA incident. MRI had no acute findings and MRA of the head and neck was normal. Echo has been completed as part of her work-up, however results are pending. She is on plavix, statin and medication for hypertension. From a neurology standpoint she is clear to be discharged and this has been communicated by Dr. Leonel Ramsay  to Dr. Wyonia Hough.    Gwenyth Bouillon DNP, FNP-C [facility] Nurse Practitioner  08/31/2020, 2:04 PM  Note review and additional recommendations to follow from attending neurologist

## 2020-08-31 NOTE — Progress Notes (Signed)
  Echocardiogram 2D Echocardiogram has been performed.  Abigail Campos 08/31/2020, 10:49 AM

## 2020-08-31 NOTE — Evaluation (Signed)
Physical Therapy Evaluation Patient Details Name: Abigail Campos MRN: [id number] DOB: 1971-02-04 Today's Date: 08/31/2020   History of Present Illness  Abigail Campos is a 49 y.o. female with medical history significant for intracranial hemorrhage, residual left hemiplegia, hypertension admitted following 2 falls at home and mild L side facial droop.  MRI negative for acute changes.  Clinical Impression  Patient presents with mobility close to her recent baseline, but has had two recent falls which are concerning her as without other falls in past 6 months.  Feels though she did not have a new stroke, she would like to return to outpatient PT for balance training as she feels this has significantly helped her in the past.  Feel she is safe for d/c home today with family support, recommend outpatient PT as planned d/c home today.     Follow Up Recommendations Outpatient PT    Equipment Recommendations  None recommended by PT    Recommendations for Other Services       Precautions / Restrictions Precautions Precautions: Fall Precaution Comments: R hemiparesis Required Braces or Orthoses: Other Brace Other Brace: R AFO      Mobility  Bed Mobility Overal bed mobility: Modified Independent                Transfers Overall transfer level: Needs assistance Equipment used: Straight cane Transfers: Sit to/from Stand Sit to Stand: Supervision         General transfer comment: for safety with h/o recent falls  Ambulation/Gait Ambulation/Gait assistance: Supervision Gait Distance (Feet): 160 Feet Assistive device: Straight cane (with tripod tip and L AFO) Gait Pattern/deviations: Step-to pattern;Decreased stride length     General Gait Details: stable with cane, S for safety, reported fatiguing with distance  Stairs            Wheelchair Mobility    Modified Rankin (Stroke Patients Only)       Balance Overall balance assessment: History of  Falls;Needs assistance   Sitting balance-Leahy Scale: Good     Standing balance support: Single extremity supported;No upper extremity supported;During functional activity Standing balance-Leahy Scale: Fair Standing balance comment: able to don gown while standing at bedside with internittent UE support                             Pertinent Vitals/Pain Pain Assessment: No/denies pain    Home Living Family/patient expects to be discharged to:: Private residence Living Arrangements: Spouse/significant other Available Help at Discharge: Family Type of Home: House Home Access: Ramped entrance     Home Layout: One level [place]: [place] - single point;Grab bars - tub/shower;Tub bench;Bedside commode Additional Comments: cane with wide base tip    Prior Function Level of Independence: Independent with assistive device(s)         Comments: uses cane and AFO at baseline, can do her own bathing/dressing except tie shoe laces     Hand Dominance        Extremity/Trunk Assessment   Upper Extremity Assessment Upper Extremity Assessment: LUE deficits/detail LUE Deficits / Details: nonfunctional since stroke in 2017    Lower Extremity Assessment Lower Extremity Assessment: LLE deficits/detail LLE Deficits / Details: foot drop and weakness consistent with her baseline utilizing AFO for distances       Communication   Communication: Expressive difficulties (some word finding difficulty at baseline)  Cognition Arousal/Alertness: Awake/alert Behavior During Therapy: WFL for tasks assessed/performed Overall Cognitive Status: Within  Functional Limits for tasks assessed                                        General Comments General comments (skin integrity, edema, etc.): reports has not done outpatient PT since 2018 after her stroke, would like to work on her balance at outpatient given recent 2 falls at home; reveiwed fall prevention and talked  about utilizing [facility] initially for nightime toileting    Exercises     Assessment/Plan    PT Assessment All further PT needs can be met in the next venue of care  PT Problem List         PT Treatment Interventions      PT Goals (Current goals can be found in the Care Plan section)  Acute Rehab PT Goals PT Goal Formulation: All assessment and education complete, DC therapy    Frequency     Barriers to discharge        Co-evaluation               AM-PAC PT "6 Clicks" Mobility  Outcome Measure Help needed turning from your back to your side while in a flat bed without using bedrails?: None Help needed moving from lying on your back to sitting on the side of a flat bed without using bedrails?: None Help needed moving to and from a bed to a chair (including a wheelchair)?: None Help needed standing up from a chair using your arms (e.g., wheelchair or bedside chair)?: None Help needed to walk in hospital room?: None Help needed climbing 3-5 steps with a railing? : A Little 6 Click Score: 23    End of Session   Activity Tolerance: Patient tolerated treatment well Patient left: in bed;with call bell/phone within reach;with bed alarm set   PT Visit Diagnosis: Other abnormalities of gait and mobility (R26.89);History of falling (Z91.81)    Time: 3570-1779 PT Time Calculation (min) (ACUTE ONLY): 24 min   Charges:   PT Evaluation $PT Eval Low Complexity: 1 Low PT Treatments $Gait Training: 8-22 mins        Magda Kiel, PT Acute Rehabilitation Services [phone] Office:[phone] 08/31/2020   Abigail Campos 08/31/2020, 5:33 PM

## 2020-09-02 ENCOUNTER — Telehealth: Payer: Self-pay

## 2020-09-02 NOTE — Telephone Encounter (Signed)
Transition Care Management Follow-up Telephone Call  Admission: 08/30/2020-08/31/2020 Diagnosis: Facial Droop   How have you been since you were released from the hospital? "I feel fine". Pt reports facial droop has resolved. Per patient, all labs and imaging negative for stroke>    Do you understand why you were in the hospital? yes   Do you understand the discharge instructions? yes   Where were you discharged to? Home. Resides with family.     Items Reviewed:  Medications reviewed: yes  Allergies reviewed: yes  Dietary changes reviewed: yes, heart healthy diet reviewed.   Referrals reviewed: yes   Functional Questionnaire:   Activities of Daily Living (ADLs):   She states they are independent in the following: ambulation, bathing and hygiene, feeding, continence, grooming, toileting and dressing States they require assistance with the following: None.    Any transportation issues/concerns?: no   Any patient concerns? no   Confirmed importance and date/time of follow-up visits scheduled yes  Provider Appointment booked with Mackie Pai, PA-C for Thursday 09/05/2020, 40 min provided.   Confirmed with patient if condition begins to worsen call PCP or go to the ER.  Patient was given the office number and encouraged to call back with question or concerns.  : yes

## 2020-09-04 ENCOUNTER — Ambulatory Visit: Payer: Medicare Other | Admitting: Internal Medicine

## 2020-09-05 ENCOUNTER — Telehealth: Payer: Self-pay | Admitting: Medical

## 2020-09-05 ENCOUNTER — Other Ambulatory Visit: Payer: Self-pay

## 2020-09-05 ENCOUNTER — Ambulatory Visit (INDEPENDENT_AMBULATORY_CARE_PROVIDER_SITE_OTHER): Payer: No Typology Code available for payment source | Admitting: Medical

## 2020-09-05 ENCOUNTER — Encounter: Payer: Self-pay | Admitting: Medical

## 2020-09-05 VITALS — BP 116/70 | HR 74 | Resp 18 | Ht 59.0 in | Wt 169.6 lb

## 2020-09-05 DIAGNOSIS — Z23 Encounter for immunization: Secondary | ICD-10-CM

## 2020-09-05 DIAGNOSIS — Z8673 Personal history of transient ischemic attack (TIA), and cerebral infarction without residual deficits: Secondary | ICD-10-CM | POA: Diagnosis not present

## 2020-09-05 DIAGNOSIS — I10 Essential (primary) hypertension: Secondary | ICD-10-CM | POA: Diagnosis not present

## 2020-09-05 DIAGNOSIS — Z9181 History of falling: Secondary | ICD-10-CM | POA: Diagnosis not present

## 2020-09-05 NOTE — Patient Instructions (Signed)
You had recent hospitalization with negative work-up for acute stroke.  On exam you were back to your baseline neuro/motor status with residual deficits left upper/lower extremity from prior stroke.  Good good symmetric smile and able to close eyes completely.  Recommended to continue all of your hypertensive medication.  They help some while you are hospitalized but then advise you to continue regular regimen.  Continue Plavix and Lipitor.  You have had recent falls in the last 2 weeks.  Your blood pressure is controlled little bit on the lower side.  I want you to check your blood pressure daily to make sure you are not having hypotension.  Give [place] an update on daily BP readings in about 7 to 10 days.  Also want you to be cautious and make sure you get your balance before ambulating.   Going to make referral to physical therapy at neuro rehab.  Place referral in epic and also faxed over the sheet you provided.  Follow-up October 07 with Dr. Posey Pronto neurologist in October 13 with your PCP.  As needed as well.

## 2020-09-05 NOTE — Progress Notes (Signed)
Subjective:    Patient ID: Abigail Campos, female    DOB: [birth year], 49 y.o.   MRN: [id number]  HPI  Pt in states on Monday and Thursday last week she fell. She states on Thursday she fell going to bathroom. Landed by mini fridge and hit her left arm. On Monday last week family caught her on fall. Neither time did she feel dizzy.  Pt has hx of prior stroke with left side deficits and uses a cane to ambulate.  Pt was evaluated in ED on   Admit date: 08/30/2020 Discharge date: 08/31/2020  Admitted From: Observation Disposition: home  Recommendations for Outpatient Follow-up:  1. Follow up with PCP in 1-2 weeks   Home Health:No Equipment/Devices:no new equipment  Discharge Condition:Stable CODE STATUS:Full code Diet recommendation: reume pre=admission diet  Brief/Interim Summary: Abigail Kaiser Matthewsonis a 49 y.o.femalewith medical history significant forintracranial hemorrhage, residual lefthemiplegia,hypertension. Patient presented to the ED with complaints of 2 falls,first fall was 4 days ago, and a second fall last night-Thursday. And new left eyedroop noticedby sister-in-law, and god- sonwhen she was video chatting earlier today.Spouse alsoincrease the patient's left is a bit droopy. Patient is not sure exactly why she fell both times, but she was using her cane which she uses to ambulate with at baseline.   Patient has residual deficits from her prior stroke.she tells me she has some mild droop involving the left side of her face, she isalsounable to move or use her left upper extremity, she has some weakness in her left lower extremity, and visual field defects involving her left eye, she also has baseline tingling involving her left upper and lower extremity.  Patient reports compliance with Plavix daily.  HOSPITAL COURSE: Right Eyedroop-subtle on neurologic exam, more of left eye being on a slightly lower level than right. Patient has chronic  left-sided facial droop, that is unchanged. -CT HEAD/MRI/MRA all negative -f/up echocardiogram -Continue Plavix,Lipitor -Neuro consult pending-anticipate pt will be stable for d/c -Passed bedside swallow evaluation -Hold antihypertensives lisinopril and carvedilol and Lasix for now to allow for permissive hypertension.  History of intracranial hemorrhage, withpoststroke hemiplegia-with significant residual deficits include left facial droop, dense left upper extremity paresis, left lower extremity weakness, left visual field deficits, left uppercase lower extremity paresthesias. Spouse is primary caregiver. Ambulates with cane at baseline. Reports compliance with Plavix. -Resume Plavix, statin  Mechanical falls-high risk for falls with visual field defects and left-sided hemiparesis. -PT evaluation  Hypertension-Stable -lisinopril,carvedilol,Lasix held for now in case of possible stroke,to allow for permissive hypertension  Discharge Diagnoses:  Principal Problem:   Facial droop Active Problems:   Essential hypertension   ICH (intracerebral hemorrhage) (HCC) - R basal ganglia due to hypertensive emergency   Hemiplegia, post-stroke (HCC)   Spastic hemiplegia of left nondominant side due to nontraumatic intraparenchymal hemorrhage of brain ([place])  Pt bp when she check 120/130/ 80-90.  Pt states  back to baseline motor/neuro status.   Pt had covid vaccine.  Review of Systems  Constitutional: Negative for chills, fatigue and unexpected weight change.  Respiratory: Negative for cough, chest tightness and wheezing.   Cardiovascular: Negative for chest pain and palpitations.  Gastrointestinal: Negative for abdominal pain, blood in stool and nausea.  Genitourinary: Negative for dysuria, flank pain, frequency and hematuria.  Musculoskeletal: Negative for back pain.  Skin: Negative for rash.  Neurological: Negative for dizziness, weakness, light-headedness and numbness.         Baseline deficits from prior stroke.  Hematological: Negative for  adenopathy. Does not bruise/bleed easily.  Psychiatric/Behavioral: Negative for behavioral problems and decreased concentration. The patient is not nervous/anxious.     Past Medical History:  Diagnosis Date  . Anxiety and depression   . Essential hypertension 10/19/2007   03-2010: metoprolol changed to bystolic (was not feeling well on it, no specific allergy or reaction)    . Hemiplegia ([place])   . Hyperlipemia   . Hypertension   . Migraine   . Neuromuscular disorder ([place])    left sided hemiplegia - has brace for l leg and walks with a cane  . Ovarian cancer ([place])   . Sleep apnea    not wearing c-pap yet  . Stroke [facility]) 2017     Social History   Socioeconomic History  . Marital status: Married    Spouse name: Not on file  . Number of children: 2  . Years of education: Not on file  . Highest education level: Not on file  Occupational History  . Occupation: disable d/t strokeTEACHER    Employer: [employer]  Tobacco Use  . Smoking status: Never Smoker  . Smokeless tobacco: Never Used  Vaping Use  . Vaping Use: Never used  Substance and Sexual Activity  . Alcohol use: No  . Drug use: No  . Sexual activity: Not Currently    Birth control/protection: Surgical  Other Topics Concern  . Not on file  Social History Narrative   Used to live independently, stroke 03-2016, d/c from rehab to his mother's house 04-2016, then moved back w/ her husband as off  02/2020   Daughter lives in [place],   [place] lives w/ pt   Social Determinants of Health   Financial Resource Strain:   . Difficulty of Paying Living Expenses: Not on file  Food Insecurity:   . Worried About Charity fundraiser in the Last Year: Not on file  . Ran Out of Food in the Last Year: Not on file  Transportation Needs:   . Lack of Transportation (Medical): Not on file  . Lack of Transportation (Non-Medical): Not on file  Physical Activity:    . Days of Exercise per Week: Not on file  . Minutes of Exercise per Session: Not on file  Stress:   . Feeling of Stress : Not on file  Social Connections:   . Frequency of Communication with Friends and Family: Not on file  . Frequency of Social Gatherings with Friends and Family: Not on file  . Attends Religious Services: Not on file  . Active Member of Clubs or Organizations: Not on file  . Attends Archivist Meetings: Not on file  . Marital Status: Not on file  Intimate Partner Violence:   . Fear of Current or Ex-Partner: Not on file  . Emotionally Abused: Not on file  . Physically Abused: Not on file  . Sexually Abused: Not on file    Past Surgical History:  Procedure Laterality Date  . ABDOMINAL HYSTERECTOMY  11-14-07   no oophorectomy per surgical report  . CESAREAN SECTION     [id number]  . INGUINAL HERNIA REPAIR     2004    Family History  Problem Relation Age of Onset  . Diabetes Mother   . Hypertension Mother   . Glaucoma Mother   . Colon cancer Mother        mother age 80  . Rectal cancer Father        dx 61  . Stroke Paternal Grandfather   .  Stroke Maternal Grandfather   . Diabetes Brother   . Hypertension Brother   . Heart disease Maternal Aunt        x 2 did 2012 CAD-CHF  . Diabetes Brother   . Hypertension Brother   . Lung cancer Maternal Aunt   . Breast cancer Neg Hx   . Esophageal cancer Neg Hx   . Stomach cancer Neg Hx     Allergies  Allergen Reactions  . Bee Venom Anaphylaxis  . Peanut-Containing Drug Products Anaphylaxis  . Hydrocodone Hives    Generalize itching w/o rash  . Isovue [Iopamidol] Hives and Itching    Pt broke out with one facial hive.  Had itching on her right upper ant and posterior arm w/o evidence of hives.  Pt given water and 25mg  benadryl po.  We observed pt for 30 minutes before d/c.  J. Bohm    . Ambien [Zolpidem Tartrate] Other (See Comments)    forgetfulness  . Aspirin Swelling    Reports blisters w/  ASA but pt reports is ok w/ Motrin, Advil, naproxen  . Latex Hives    Current Outpatient Medications on File Prior to Visit  Medication Sig Dispense Refill  . atorvastatin (LIPITOR) 20 MG tablet Take 1 tablet (20 mg total) by mouth daily at 6 PM. 90 tablet 3  . baclofen (LIORESAL) 20 MG tablet TAKE 1 TABLET BY MOUTH THREE TIMES A DAY 90 tablet 3  . buPROPion (WELLBUTRIN) 100 MG tablet Take 1 tablet (100 mg total) by mouth 2 (two) times daily. 180 tablet 3  . carvedilol (COREG) 6.25 MG tablet Take 1 tablet (6.25 mg total) by mouth 2 (two) times daily with a meal. 60 tablet 6  . cephALEXin (KEFLEX) 500 MG capsule Take 1 capsule (500 mg total) by mouth 3 (three) times daily. (Patient not taking: Reported on 08/31/2020) 21 capsule 0  . clopidogrel (PLAVIX) 75 MG tablet Take 1 tablet (75 mg total) by mouth daily. 90 tablet 3  . Cyanocobalamin (VITAMIN B 12 PO) Take 1 tablet by mouth daily.    Marland Kitchen escitalopram (LEXAPRO) 20 MG tablet Take 1 tablet (20 mg total) by mouth daily. 90 tablet 1  . furosemide (LASIX) 20 MG tablet Take 1 tablet (20 mg total) by mouth daily. 90 tablet 1  . gabapentin (NEURONTIN) 300 MG capsule Take 1 capsule (300 mg total) by mouth 2 (two) times daily. 60 capsule 5  . lisinopril (ZESTRIL) 2.5 MG tablet Take 1 tablet (2.5 mg total) by mouth daily. 90 tablet 1  . traZODone (DESYREL) 100 MG tablet Take 1 tablet (100 mg total) by mouth at bedtime as needed for sleep. 90 tablet 1   No current facility-administered medications on file prior to visit.    BP 116/70   Pulse 74   Resp 18   Ht 4\' 11"  (1.499 m)   Wt 169 lb 9.6 oz (76.9 kg)   SpO2 97%   BMI 34.26 kg/m       Objective:   Physical Exam  General Mental Status- Alert. General Appearance- Not in acute distress.   Skin General: Color- Normal Color. Moisture- Normal Moisture.  Neck Carotid Arteries- Normal color. Moisture- Normal Moisture. No carotid bruits. No JVD.  Chest and Lung Exam Auscultation: Breath  Sounds:-Normal.  Cardiovascular Auscultation:Rythm- Regular. Murmurs & Other Heart Sounds:Auscultation of the heart reveals- No Murmurs.  Abdomen Inspection:-Inspeection Normal. Palpation/Percussion:Note:No mass. Palpation and Percussion of the abdomen reveal- Non Tender, Non Distended + BS, no rebound or  guarding.    Neurologic Cranial Nerve exam:- CN III-XII intact(No nystagmus), symmetric smile. Rt upper and lower ext 5/5 strength. Lt upper and lower ext weak. Decreased rom. Historical motor deficits Left side of face- sharp and dull descrimation intact. Can close left eye completely and raise eye brow. No droop.      Assessment & Plan:  You had recent hospitalization with negative work-up for acute stroke.  On exam you were back to your baseline neuro/motor status with residual deficits left upper/lower extremity from prior stroke.  Good good symmetric smile and able to close eyes completely.  Recommended to continue all of your hypertensive medication.  They help some while you are hospitalized but then advise you to continue regular regimen.  Continue Plavix and Lipitor.  You have had recent falls in the last 2 weeks.  Your blood pressure is controlled little bit on the lower side.  I want you to check your blood pressure daily to make sure you are not having hypotension.  Give [place] an update on daily BP readings in about 7 to 10 days.  Also want you to be cautious and make sure you get your balance before ambulating.   Going to make referral to physical therapy at neuro rehab.  Place referral in epic and also faxed over the sheet you provided.  Follow-up October 07 with Dr. Posey Pronto neurologist in October 13 with your PCP.  As needed as well.  Mackie Pai, PA-C   Time spent with patient today was 40  minutes which consisted of chart review hospitalizatoin., placing referral/filling out referral form she provied, discussing diagnoses,  treatment and documentation.

## 2020-09-05 NOTE — Telephone Encounter (Signed)
Neuro rehab number is [phone]. Fax [phone]

## 2020-09-12 ENCOUNTER — Other Ambulatory Visit: Payer: Self-pay

## 2020-09-12 ENCOUNTER — Ambulatory Visit: Payer: No Typology Code available for payment source | Attending: Internal Medicine | Admitting: Physical Therapy

## 2020-09-12 DIAGNOSIS — M6281 Muscle weakness (generalized): Secondary | ICD-10-CM | POA: Diagnosis not present

## 2020-09-12 DIAGNOSIS — R2689 Other abnormalities of gait and mobility: Secondary | ICD-10-CM | POA: Diagnosis not present

## 2020-09-12 DIAGNOSIS — R29818 Other symptoms and signs involving the nervous system: Secondary | ICD-10-CM

## 2020-09-12 DIAGNOSIS — I69352 Hemiplegia and hemiparesis following cerebral infarction affecting left dominant side: Secondary | ICD-10-CM

## 2020-09-12 DIAGNOSIS — Z9181 History of falling: Secondary | ICD-10-CM | POA: Insufficient documentation

## 2020-09-12 NOTE — Therapy (Signed)
[facility] [address] [place], [place], [address] Phone: [phone]   Fax:  [phone]  Physical Therapy Evaluation  Patient Details  Name: Abigail Campos MRN: [id number] Date of Birth: 05/27/1971 Referring Provider (PT): Mackie Pai, [place]   Encounter Date: 09/12/2020   PT End of Session - 09/12/20 1508    Visit Number 1    Number of Visits 17    Date for PT Re-Evaluation 12/11/20   written for 8 week POC   Authorization Type [employer]et[employer]    PT Start Time 2951    PT Stop Time 1313   pt using bathroom for4 minutes   PT Time Calculation (min) 43 min    Equipment Utilized During Treatment Gait belt    Activity Tolerance Patient tolerated treatment well    Behavior During Therapy [facility] for tasks assessed/performed           Past Medical History:  Diagnosis Date  . Anxiety and depression   . Essential hypertension 10/19/2007   03-2010: metoprolol changed to bystolic (was not feeling well on it, no specific allergy or reaction)    . Hemiplegia ([place])   . Hyperlipemia   . Hypertension   . Migraine   . Neuromuscular disorder ([place])    left sided hemiplegia - has brace for l leg and walks with a cane  . Ovarian cancer ([place])   . Sleep apnea    not wearing c-pap yet  . Stroke [facility]) 2017    Past Surgical History:  Procedure Laterality Date  . ABDOMINAL HYSTERECTOMY  11-14-07   no oophorectomy per surgical report  . CESAREAN SECTION     [id number]  . INGUINAL HERNIA REPAIR     2004    There were no vitals filed for this visit.    Subjective Assessment - 09/12/20 1236    Subjective Went to the ED on 08/30/20 following 2 falls at home and mild L side facial droop.  MRI negative for acute changes. First fall - husband had to catch her. 2nd fall fell over to the L side and over on her mini fridge. Needed help to get up. Has been in bed mostly since home because she has been tired and scared. Bedside commode is in  her room.    Pertinent History PMH: HTN, hx of CVA (2017), ovarian cancer)    Limitations Walking;Standing    Patient Stated Goals wants to be able to stand on her left leg, improve the balance.    Currently in Pain? No/denies              [facility] PT Assessment - 09/12/20 1241      Assessment   Medical Diagnosis CVA, falls    Referring Provider (PT) Saguier, Percell Miller, PA-C    Onset Date/Surgical Date 08/30/20   CVA in 2017   Hand Dominance Right    Prior Therapy PT and OT in 2017/2018      Precautions   Precautions Fall      Balance Screen   Has the patient fallen in the past 6 months Yes    How many times? 2    Has the patient had a decrease in activity level because of a fear of falling?  Yes    Is the patient reluctant to leave their home because of a fear of falling?  No      Home Ecologist residence    Living Arrangements Spouse/significant other;Other (Comment)  1 son   Available Help at Discharge Family    Type of [place] Access Level entry    [place] - single point;Other (comment);Grab bars - toilet;Grab bars - tub/shower;Bedside commode;Shower seat   with quad tip   Additional Comments husband helping with more now after the falls      Prior Function   Level of Independence Independent    Leisure likes crafting, cooking, watching movies       Sensation   Light Touch Impaired Detail    Light Touch Impaired Details Impaired LLE   unable to detect intermittently LLE   Proprioception --      Coordination   Gross Motor Movements are Fluid and Coordinated No    Heel Shin Test impaired more LLE due to L hemiparesis      ROM / Strength   AROM / PROM / Strength Strength      Strength   Strength Assessment Site Hip;Knee;Ankle    Right/Left Hip Right;Left    Right Hip Flexion 4+/5    Left Hip Flexion 3-/5    Right/Left Knee Right;Left    Right Knee Flexion 5/5    Right Knee  Extension 5/5    Left Knee Flexion 2+/5    Left Knee Extension 3-/5    Right/Left Ankle Right;Left    Right Ankle Dorsiflexion 5/5    Left Ankle Dorsiflexion --   unable to assess due to AFO     Transfers   Transfers Sit to Stand;Stand to Sit    Five time sit to stand comments  18.68 seconds with single UE support   LLE in incr external rotation   Comments attempted 1 rep without UE support- cues for scooting towards edge of chair and for nose over toes with min guard, pt attempting another at end of session, needing min A in standing for balanace and then pt sitting back down to chair      Ambulation/Gait   Ambulation/Gait Yes    Ambulation/Gait Assistance 5: Supervision;4: Min guard    Ambulation Distance (Feet) --   clinic distances   Assistive device Straight cane;Other (Comment)   with 4 prong tip   Gait Pattern Decreased stance time - left;Abducted - left;Wide base of support;Step-through pattern;Decreased step length - right;Decreased hip/knee flexion - left;Decreased weight shift to left    Ambulation Surface Level;Indoor    Gait velocity 28.41 seconds = 1.15 ft/sec      Standardized Balance Assessment   Standardized Balance Assessment Timed Up and Go Test;Berg Balance Test      Berg Balance Test   Sit to Stand Able to stand  independently using hands    Standing Unsupported Able to stand safely 2 minutes    Sitting with Back Unsupported but Feet Supported on Floor or Stool Able to sit safely and securely 2 minutes    Stand to Sit Controls descent by using hands    Berg comment: will finish at next session      Timed Up and Go Test   Normal TUG (seconds) 39.06    TUG Comments with SPC with quad tip                      Objective measurements completed on examination: See above findings.               PT Education - 09/12/20 1507    Education  Details clinical findings, POC, fall risk    Person(s) Educated Patient    Methods Explanation     Comprehension Verbalized understanding            PT Short Term Goals - 09/12/20 1511      PT SHORT TERM GOAL #1   Title Pt will be independent with initial HEP for improved strength, balance, and gait. ALL STGS DUE 10/10/20    Time 4    Period Weeks    Status New    Target Date 10/10/20      PT SHORT TERM GOAL #2   Title Pt will undergo further assessment of BERG - LTG to be written as appropriate.    Time 4    Period Weeks    Status New      PT SHORT TERM GOAL #3   Title Pt will decr TUG time to 35 seconds or less with SPC with quad tip in order to demo decr fall risk.    Baseline 39.06 seconds    Time 4    Period Weeks    Status New      PT SHORT TERM GOAL #4   Title Pt will improve gait speed to at least 1.4 ft/sec in order to demo decr fall risk.    Baseline 1.15 ft/sec    Time 4    Period Weeks    Status New             PT Long Term Goals - 09/12/20 1513      PT LONG TERM GOAL #1   Title patient to be independent with final HEP for improved functional mobility. ALL LTGS DUE 11/07/20    Time 8    Period Weeks    Status New    Target Date 11/07/20      PT LONG TERM GOAL #2   Title Pt will improve gait speed to at least 1.8 ft/sec with LRAD in order to demo decr fall risk.    Baseline 1.15 ft/sec    Time 8    Period Weeks    Status New      PT LONG TERM GOAL #3   Title Pt will decr TUG time to 30 seconds or less with SPC with quad tip in order to demo decr fall risk.    Baseline 39.06 seconds    Time 8    Period Weeks    Status New      PT LONG TERM GOAL #4   Title Pt will ambulate at least 300' over indoor level and outdoor unlevel surfaces with LRAD with supervision in order to demo improved community mobility.    Time 8    Period Weeks    Status New      PT LONG TERM GOAL #5   Title Pt will perform 5x sit <> stand in 16 seconds or less with no UE support vs. single UE support in order to demo improved BLE strength.    Baseline 18.68 seconds     Time 8    Period Weeks                  Plan - 09/12/20 1515    Clinical Impression Statement Patient is a 49 year old female referred to Neuro OPPT for recent falls. Pt went to ED on 08/30/20 - admitted following 2 falls at home and mild L side facial droop.  MRI negative for acute changes.  Pt has a hx of  a R CVA (2017) and has received extensive PT at this location in the past. Pt's PMH is significant for: CVA, HTN, ovarian cancer. The following deficits were present during the exam:  decr LLE sensation, gait abnormalities, balance impairments, decr coordination, decr endurance, decr BLE strength. Based on 5x sit <> stand and TUG pt is at a high risk for falls. Pt's gait speed of 1.15 ft/sec indicates pt is a household ambulator. Pt would benefit from skilled PT to address these impairments and functional limitations to maximize functional mobility independence    Personal Factors and Comorbidities Comorbidity 3+;Time since onset of injury/illness/exacerbation;Fitness;Past/Current Experience    Comorbidities HTN, hx of CVA (2017), ovarian cancer    Examination-Activity Limitations Bathing;Locomotion Level;Stand;Transfers;Caring for Others    Examination-Participation Restrictions Community Activity    Stability/Clinical Decision Making Stable/Uncomplicated    Clinical Decision Making Low    Rehab Potential Good    PT Frequency 2x / week    PT Duration 8 weeks    PT Treatment/Interventions ADLs/Self Care Home Management;Aquatic Therapy;DME Instruction;Gait training;Functional mobility training;Therapeutic activities;Stair training;Therapeutic exercise;Balance training;Neuromuscular re-education;Orthotic Fit/Training;Patient/family education;Passive range of motion;Vestibular    PT Next Visit Plan finish BERG - with PT to write goal. initial HEP for strength/balance (pt states she has been doing some exercises from previous PT bout, check what she is doing and add/revise as appropriate).  standing balance.    Consulted and Agree with Plan of Care Patient           Patient will benefit from skilled therapeutic intervention in order to improve the following deficits and impairments:  Abnormal gait, Decreased activity tolerance, Decreased balance, Decreased coordination, Decreased endurance, Decreased strength, Difficulty walking, Impaired sensation  Visit Diagnosis: Hemiplegia and hemiparesis following cerebral infarction affecting left dominant side (HCC)  Other symptoms and signs involving the nervous system  Other abnormalities of gait and mobility  Muscle weakness (generalized)  History of falling     Problem List Patient Active Problem List   Diagnosis Date Noted  . Facial droop 08/30/2020  . Abnormality of gait 05/09/2020  . Hemiplegia of nondominant side, late effect of cerebrovascular disease ([place]) 03/28/2020  . Pleural effusion 03/01/2020  . Sleep disturbance 02/06/2020  . Spastic hemiplegia of left nondominant side due to nontraumatic intraparenchymal hemorrhage of brain ([place]) 12/26/2019  . Trigger thumb of right hand 11/02/2019  . Obstructive sleep apnea 07/26/2019  . Spastic hemiplegia of left nondominant side as late effect of nontraumatic intraparenchymal hemorrhage of brain ([place]) 04/06/2019  . Spastic hemiparesis affecting dominant side ([place]) 01/04/2018  . Hyperglycemia 12/29/2016  . Anxiety and depression 12/29/2016  . Muscle spasticity   . Obesity (BMI 30.0-34.9) 04/08/2016  . Hyperlipidemia 04/08/2016  . Basal ganglia hemorrhage ([place]) 04/08/2016  . Dysphagia as late effect of cerebrovascular disease   . Migraine with aura and without status migrainosus, not intractable   . Gait disturbance, post-stroke   . Hemiplegia, post-stroke ([place])   . Aphasia, post-stroke   . Dysarthria, post-stroke   . ICH (intracerebral hemorrhage) (HCC) - R basal ganglia due to hypertensive emergency 04/01/2016  . Upper airway cough syndrome 12/06/2015  . PCP  NOTES >>>>>>>>>>>>>>>>>>>>>>>>>>>.. 09/25/2015  . Insomnia 12/28/2012  . Annual physical exam 11/23/2011  . Essential hypertension 10/19/2007    Arliss Journey, PT, DPT  09/12/2020, 3:22 PM  [facility] [address] [place], [place], [address] Phone: [phone]   Fax:  [phone]  Name: TYLEAH LOH MRN: [id number] Date of  Birth: [birth year]

## 2020-09-19 ENCOUNTER — Other Ambulatory Visit: Payer: Self-pay

## 2020-09-19 ENCOUNTER — Encounter (HOSPITAL_BASED_OUTPATIENT_CLINIC_OR_DEPARTMENT_OTHER): Payer: No Typology Code available for payment source | Admitting: Physical Medicine & Rehabilitation

## 2020-09-19 ENCOUNTER — Encounter: Payer: Self-pay | Admitting: Physical Medicine & Rehabilitation

## 2020-09-19 VITALS — BP 120/67 | HR 82 | Temp 98.8°F | Ht 59.0 in | Wt 170.2 lb

## 2020-09-19 DIAGNOSIS — I69398 Other sequelae of cerebral infarction: Secondary | ICD-10-CM | POA: Diagnosis not present

## 2020-09-19 DIAGNOSIS — R29818 Other symptoms and signs involving the nervous system: Secondary | ICD-10-CM | POA: Diagnosis not present

## 2020-09-19 DIAGNOSIS — Z9181 History of falling: Secondary | ICD-10-CM | POA: Diagnosis not present

## 2020-09-19 DIAGNOSIS — M65311 Trigger thumb, right thumb: Secondary | ICD-10-CM

## 2020-09-19 DIAGNOSIS — M792 Neuralgia and neuritis, unspecified: Secondary | ICD-10-CM | POA: Insufficient documentation

## 2020-09-19 DIAGNOSIS — I69154 Hemiplegia and hemiparesis following nontraumatic intracerebral hemorrhage affecting left non-dominant side: Secondary | ICD-10-CM

## 2020-09-19 DIAGNOSIS — F4323 Adjustment disorder with mixed anxiety and depressed mood: Secondary | ICD-10-CM | POA: Insufficient documentation

## 2020-09-19 DIAGNOSIS — R269 Unspecified abnormalities of gait and mobility: Secondary | ICD-10-CM | POA: Diagnosis not present

## 2020-09-19 DIAGNOSIS — I69352 Hemiplegia and hemiparesis following cerebral infarction affecting left dominant side: Secondary | ICD-10-CM | POA: Diagnosis not present

## 2020-09-19 DIAGNOSIS — G8114 Spastic hemiplegia affecting left nondominant side: Secondary | ICD-10-CM | POA: Insufficient documentation

## 2020-09-19 DIAGNOSIS — R2689 Other abnormalities of gait and mobility: Secondary | ICD-10-CM | POA: Diagnosis not present

## 2020-09-19 DIAGNOSIS — F32A Depression, unspecified: Secondary | ICD-10-CM | POA: Insufficient documentation

## 2020-09-19 DIAGNOSIS — M6281 Muscle weakness (generalized): Secondary | ICD-10-CM | POA: Diagnosis not present

## 2020-09-19 DIAGNOSIS — I619 Nontraumatic intracerebral hemorrhage, unspecified: Secondary | ICD-10-CM | POA: Insufficient documentation

## 2020-09-19 MED ORDER — GABAPENTIN 300 MG PO CAPS: 300.0000 mg | ORAL_CAPSULE | Freq: Three times a day (TID) | ORAL | 1 refills | Status: DC

## 2020-09-19 MED ORDER — BACLOFEN 20 MG PO TABS: 30.0000 mg | ORAL_TABLET | Freq: Three times a day (TID) | ORAL | 1 refills | Status: DC

## 2020-09-19 NOTE — Progress Notes (Signed)
Subjective:    Patient ID: Abigail Campos, female    DOB: 04/06/1971, 49 y.o.   MRN: [id number]  HPI:  Right-handed female with history of hypertension, migraine headaches presents for follow up after right basal ganglia hemorrhage.   Last clinic visit 07/01/20.  She had Dysport injection at that time.  Since that time, pt went to the hospital due to falls and concern for recurrent stroke. Notes reviewed, unremarkable for recurrent CVA. Pt states she is doing better since that time. She states Baclofen does not work anymore. She restarted therapies. Thumb is not painful. She follows up for sleep study next month.  Pain Inventory Average Pain 4 Pain Right Now 0 My pain is intermittent, tingling and aching  In the last 24 hours, has pain interfered with the following? General activity 6 Relation with others 10 Enjoyment of life 6 What TIME of day is your pain at its worst? daytime & evening. Sleep (in general) Poor  Pain is worse with: walking and standing Pain improves with: rest Relief from Meds: Not taking pain meds.    Family History  Problem Relation Age of Onset  . Diabetes Mother   . Hypertension Mother   . Glaucoma Mother   . Colon cancer Mother        mother age 5  . Rectal cancer Father        dx 14  . Stroke Paternal Grandfather   . Stroke Maternal Grandfather   . Diabetes Brother   . Hypertension Brother   . Heart disease Maternal Aunt        x 2 did 2012 CAD-CHF  . Diabetes Brother   . Hypertension Brother   . Lung cancer Maternal Aunt   . Breast cancer Neg Hx   . Esophageal cancer Neg Hx   . Stomach cancer Neg Hx    Social History   Socioeconomic History  . Marital status: Married    Spouse name: Not on file  . Number of children: 2  . Years of education: Not on file  . Highest education level: Not on file  Occupational History  . Occupation: disable d/t strokeTEACHER    Employer: [employer]  Tobacco Use  . Smoking status: Never  Smoker  . Smokeless tobacco: Never Used  Vaping Use  . Vaping Use: Never used  Substance and Sexual Activity  . Alcohol use: No  . Drug use: No  . Sexual activity: Not Currently    Birth control/protection: Surgical  Other Topics Concern  . Not on file  Social History Narrative   Used to live independently, stroke 03-2016, d/c from rehab to his mother's house 04-2016, then moved back w/ her husband as off  02/2020   Daughter lives in [place],   [place] lives w/ pt   Social Determinants of Health   Financial Resource Strain:   . Difficulty of Paying Living Expenses: Not on file  Food Insecurity:   . Worried About Charity fundraiser in the Last Year: Not on file  . Ran Out of Food in the Last Year: Not on file  Transportation Needs:   . Lack of Transportation (Medical): Not on file  . Lack of Transportation (Non-Medical): Not on file  Physical Activity:   . Days of Exercise per Week: Not on file  . Minutes of Exercise per Session: Not on file  Stress:   . Feeling of Stress : Not on file  Social Connections:   . Frequency of  Communication with Friends and Family: Not on file  . Frequency of Social Gatherings with Friends and Family: Not on file  . Attends Religious Services: Not on file  . Active Member of Clubs or Organizations: Not on file  . Attends Archivist Meetings: Not on file  . Marital Status: Not on file   Past Surgical History:  Procedure Laterality Date  . ABDOMINAL HYSTERECTOMY  11-14-07   no oophorectomy per surgical report  . CESAREAN SECTION     [id number]  . INGUINAL HERNIA REPAIR     2004   Past Medical History:  Diagnosis Date  . Anxiety and depression   . Essential hypertension 10/19/2007   03-2010: metoprolol changed to bystolic (was not feeling well on it, no specific allergy or reaction)    . Hemiplegia ([place])   . Hyperlipemia   . Hypertension   . Migraine   . Neuromuscular disorder ([place])    left sided hemiplegia - has brace for l leg  and walks with a cane  . Ovarian cancer ([place])   . Sleep apnea    not wearing c-pap yet  . Stroke ([place]) 2017   BP 120/67   Pulse 82   Temp 98.8 F (37.1 C)   Ht 4\' 11"  (1.499 m)   Wt 170 lb 3.2 oz (77.2 kg)   SpO2 97%   BMI 34.38 kg/m   Opioid Risk Score:   Fall Risk Score:  `1  Depression screen PHQ 2/9  Depression screen [facility] 2/9 09/19/2020 07/01/2020 06/24/2020 05/09/2020 07/12/2019 05/04/2019 04/06/2019  Decreased Interest 1 1 2 1 2 1 1   Down, Depressed, Hopeless 1 1 2 1 2 1 1   PHQ - 2 Score 2 2 4 2 4 2 2   Altered sleeping - - 3 - 3 - -  Tired, decreased energy - - 2 - 3 - -  Change in appetite - - 3 - 3 - -  Feeling bad or failure about yourself  - - 0 - 3 - -  Trouble concentrating - - 2 - 2 - -  Moving slowly or fidgety/restless - - 3 - 2 - -  Suicidal thoughts - - 0 - 0 - -  PHQ-9 Score - - 17 - 20 - -  Difficult doing work/chores - - Somewhat difficult - - - -  Some recent data might be hidden   Review of Systems  Musk: Gait abnormality, arthralgias Neurological: Positive for weakness, numbness, tingling.  All other systems reviewed and are negative.     Objective:     Physical Exam Constitutional: No distress . Vital signs reviewed. HENT: Normocephalic.  Atraumatic. Eyes: EOMI. No discharge. Cardiovascular: No JVD.   Respiratory: Normal effort.  No stridor.   GI: Non-distended.   Skin: Warm and dry.  Intact. Psych: Normal mood.  Normal behavior. Musc: No edema in extremities.  No tenderness in extremities. Gait: Hemiparetic Neurological: Alert Motor:  LUE: Shoulder abduction 2-/5, distally 0/5 LLE: Hip flexion 4-/5, knee extension 4/5, ADF/PF 1/5 Modified [place]: Left 0/4 elbow flexors, wrist flexors 1/4, 1+/4 left finger flexors,1+/4 ankle plantar flexors.     Assessment & Plan:  Right-handed female with history of hypertension, migraine headaches presents for follow up after right basal ganglia hemorrhage.    1. Spastic hemiplegia late effect of  right basal ganglia hemorrhage affecting left nondominant side             Continue meds  Cont Follow up Neurology             Spastic hemiplegia affecting left non-dominant side             Will increase baclofen to 30mg  TID              Continue Dysport injection:                         Left Med biceps: Cont 100 units                         Left FCR: Cont 100 units                         Left FCU: Cont 100 units                         Left FDS: 450 units                         Left FDP: 450 units                          Left Med gastroc:Cont 150                          Left Lat gastroc: Cont 150              Continue therapies             Cont WHO   2. Gait abnormality             Continue narrow quad cane             Continue Therapies  3. Right trigger thumb             Overuse injury as well with positioning             Figure 8 splint, not using at present             Pt to consider surgery referral in future if necessary             Good benefit with injection on 10/29             Hold off on further injection at present  Improved  4. Sleep disturbance             Continue to follow with sleep specialist  5. Anxiety/Depression  Will make referral for Neuropsych  Is planning on going to group therapy  6. Neuropathic pain - mainly in left foot  Gabapentin 300 TID

## 2020-09-20 ENCOUNTER — Encounter: Payer: Self-pay | Admitting: Physical Therapy

## 2020-09-20 ENCOUNTER — Ambulatory Visit: Payer: No Typology Code available for payment source | Attending: Internal Medicine | Admitting: Physical Therapy

## 2020-09-20 DIAGNOSIS — I69352 Hemiplegia and hemiparesis following cerebral infarction affecting left dominant side: Secondary | ICD-10-CM | POA: Insufficient documentation

## 2020-09-20 DIAGNOSIS — Z9181 History of falling: Secondary | ICD-10-CM | POA: Diagnosis not present

## 2020-09-20 DIAGNOSIS — R2689 Other abnormalities of gait and mobility: Secondary | ICD-10-CM | POA: Insufficient documentation

## 2020-09-20 DIAGNOSIS — M6281 Muscle weakness (generalized): Secondary | ICD-10-CM | POA: Diagnosis not present

## 2020-09-20 DIAGNOSIS — R29818 Other symptoms and signs involving the nervous system: Secondary | ICD-10-CM | POA: Diagnosis not present

## 2020-09-20 NOTE — Patient Instructions (Signed)
Access Code: [email or web address] URL: [email or web address] Date: 09/20/2020 Prepared by: Willow Ora  Exercises Sit to/from Stand in Stride position - 1 x daily - 5 x weekly - 1 sets - 10 reps Seated Knee Extension with Resistance - 1 x daily - 5 x weekly - 1 sets - 10 reps Seated Hamstring Curl with Anchored Resistance - 1 x daily - 5 x weekly - 1 sets - 10 reps

## 2020-09-20 NOTE — Therapy (Addendum)
[facility] [address] [place], [place], [address] Phone: [phone]   Fax:  [phone]  Physical Therapy Treatment  Patient Details  Name: Abigail Campos MRN: [id number] Date of Birth: [birth year] Referring Provider (PT): Mackie Pai, [place]   Encounter Date: 09/20/2020   PT End of Session - 09/20/20 1322    Visit Number 2    Number of Visits 17    Date for PT Re-Evaluation 12/11/20   written for 8 week POC   Authorization Type [employer]    PT Start Time 1317    PT Stop Time 1400    PT Time Calculation (min) 43 min    Equipment Utilized During Treatment Gait belt    Activity Tolerance Patient tolerated treatment well    Behavior During Therapy [facility] for tasks assessed/performed           Past Medical History:  Diagnosis Date  . Anxiety and depression   . Essential hypertension 10/19/2007   03-2010: metoprolol changed to bystolic (was not feeling well on it, no specific allergy or reaction)    . Hemiplegia ([place])   . Hyperlipemia   . Hypertension   . Migraine   . Neuromuscular disorder ([place])    left sided hemiplegia - has brace for l leg and walks with a cane  . Ovarian cancer ([place])   . Sleep apnea    not wearing c-pap yet  . Stroke [facility]) 2017    Past Surgical History:  Procedure Laterality Date  . ABDOMINAL HYSTERECTOMY  11-14-07   no oophorectomy per surgical report  . CESAREAN SECTION     [id number]  . INGUINAL HERNIA REPAIR     2004    There were no vitals filed for this visit.   Subjective Assessment - 09/20/20 1321    Subjective No new complaints. No falls or pain to report.    Pertinent History PMH: HTN, hx of CVA (2017), ovarian cancer)    Limitations Walking;Standing    Patient Stated Goals wants to be able to stand on her left leg, improve the balance.    Currently in Pain? No/denies    Pain Score 0-No pain              [facility] PT Assessment - 09/20/20 1323      Berg Balance  Test   Sit to Stand Able to stand  independently using hands    Standing Unsupported Able to stand safely 2 minutes    Sitting with Back Unsupported but Feet Supported on Floor or Stool Able to sit safely and securely 2 minutes    Stand to Sit Controls descent by using hands    Transfers Able to transfer safely, minor use of hands    Standing Unsupported with Eyes Closed Able to stand 10 seconds with supervision    Standing Unsupported with Feet Together Able to place feet together independently and stand for 1 minute with supervision    From Standing, Reach Forward with Outstretched Arm Can reach confidently >25 cm (10")    From Standing Position, Pick up Object from Floor Able to pick up shoe safely and easily    From Standing Position, Turn to Look Behind Over each Shoulder Turn sideways only but maintains balance    Turn 360 Degrees Needs close supervision or verbal cueing    Standing Unsupported, Alternately Place Feet on Step/Stool Needs assistance to keep from falling or unable to try   LLE buckes with weight bearing  without UE support   Standing Unsupported, One Foot in Front Able to plae foot ahead of the other independently and hold 30 seconds    Standing on One Leg Tries to lift leg/unable to hold 3 seconds but remains standing independently    Total Score 39    Berg comment: 37-45 significant risk for falls                  [facility] Adult PT Treatment/Exercise - 09/20/20 1323      Transfers   Transfers Sit to Stand;Stand to Sit    Sit to Stand 5: Supervision;With upper extremity assist;From bed;From chair/3-in-1    Stand to Sit 5: Supervision;With upper extremity assist;To bed;To chair/3-in-1      Ambulation/Gait   Ambulation/Gait Yes    Ambulation/Gait Assistance 5: Supervision;4: Min guard    Ambulation/Gait Assistance Details around clinic with session    Assistive device Straight cane;Other (Comment)    Gait Pattern Decreased stance time - left;Abducted - left;Wide  base of support;Step-through pattern;Decreased step length - right;Decreased hip/knee flexion - left;Decreased weight shift to left    Ambulation Surface Level;Indoor      Exercises   Exercises Other Exercises    Other Exercises  issued ex's for strengthening and balance. Refer to [facility] [place] for full details.            issued the following to HEP today:   Access Code: [email or web address] URL: [email or web address] Date: 09/20/2020 Prepared by: Willow Ora  Exercises Sit to/from Stand in Stride position - 1 x daily - 5 x weekly - 1 sets - 10 reps Seated Knee Extension with Resistance - 1 x daily - 5 x weekly - 1 sets - 10 reps Seated Hamstring Curl with Anchored Resistance - 1 x daily - 5 x weekly - 1 sets - 10 reps      PT Education - 09/20/20 1404    Education Details results of Berg Balance Test; initated HEP for strengthening    Person(s) Educated Patient    Methods Explanation;Demonstration;Verbal cues;Handout    Comprehension Verbalized understanding;Returned demonstration;Verbal cues required;Need further instruction            PT Short Term Goals - 09/12/20 1511      PT SHORT TERM GOAL #1   Title Pt will be independent with initial HEP for improved strength, balance, and gait. ALL STGS DUE 10/10/20    Time 4    Period Weeks    Status New    Target Date 10/10/20      PT SHORT TERM GOAL #2   Title Pt will undergo further assessment of BERG - LTG to be written as appropriate.    Time 4    Period Weeks    Status New      PT SHORT TERM GOAL #3   Title Pt will decr TUG time to 35 seconds or less with SPC with quad tip in order to demo decr fall risk.    Baseline 39.06 seconds    Time 4    Period Weeks    Status New      PT SHORT TERM GOAL #4   Title Pt will improve gait speed to at least 1.4 ft/sec in order to demo decr fall risk.    Baseline 1.15 ft/sec    Time 4    Period Weeks    Status New             PT Long Term Goals - 09/24/20 1701  PT LONG TERM GOAL #1   Title patient to be independent with final HEP for improved functional mobility. ALL LTGS DUE 11/07/20    Time 8    Period Weeks    Status New      PT LONG TERM GOAL #2   Title Pt will improve gait speed to at least 1.8 ft/sec with LRAD in order to demo decr fall risk.    Baseline 1.15 ft/sec    Time 8    Period Weeks    Status New      PT LONG TERM GOAL #3   Title Pt will decr TUG time to 30 seconds or less with SPC with quad tip in order to demo decr fall risk.    Baseline 39.06 seconds    Time 8    Period Weeks    Status New      PT LONG TERM GOAL #4   Title Pt will ambulate at least 300' over indoor level and outdoor unlevel surfaces with LRAD with supervision in order to demo improved community mobility.    Time 8    Period Weeks    Status New      PT LONG TERM GOAL #5   Title Pt will perform 5x sit <> stand in 16 seconds or less with no UE support vs. single UE support in order to demo improved BLE strength.    Baseline 18.68 seconds    Time 8    Period Weeks      Additional Long Term Goals   Additional Long Term Goals Yes      PT LONG TERM GOAL #6   Title Pt will improve BERG to at least a 44/56 in order to demo decr fall risk.    Baseline 39/56    Time 8    Period Weeks    Status New            .   Plan - 09/20/20 1322    Clinical Impression Statement Today's skilled session focused on completion of Berg Balance Test with pt scoring in the moderate fall risk category. Remainder of session focused on issuing an HEP to address LE strengthening. No issues noted or reported in session. The pt is progressing toward goals and should benefit from continued PT to progress toward unmet goals.    Personal Factors and Comorbidities Comorbidity 3+;Time since onset of injury/illness/exacerbation;Fitness;Past/Current Experience    Comorbidities HTN, hx of CVA (2017), ovarian cancer    Examination-Activity Limitations Bathing;Locomotion  Level;Stand;Transfers;Caring for Others    Examination-Participation Restrictions Community Activity    Stability/Clinical Decision Making Stable/Uncomplicated    Rehab Potential Good    PT Frequency 2x / week    PT Duration 8 weeks    PT Treatment/Interventions ADLs/Self Care Home Management;Aquatic Therapy;DME Instruction;Gait training;Functional mobility training;Therapeutic activities;Stair training;Therapeutic exercise;Balance training;Neuromuscular re-education;Orthotic Fit/Training;Patient/family education;Passive range of motion;Vestibular    PT Next Visit Plan add standing balance to HEP. continue to work on Aeronautical engineer, balance and strengthening    PT Home Exercise Plan Access Code: [email or web address]    Consulted and Agree with Plan of Care Patient           Patient will benefit from skilled therapeutic intervention in order to improve the following deficits and impairments:  Abnormal gait, Decreased activity tolerance, Decreased balance, Decreased coordination, Decreased endurance, Decreased strength, Difficulty walking, Impaired sensation  Visit Diagnosis: Hemiplegia and hemiparesis following cerebral infarction affecting left dominant side (HCC)  Other abnormalities of gait and mobility  Muscle weakness (generalized)  Other symptoms and signs involving the nervous system     Problem List Patient Active Problem List   Diagnosis Date Noted  . Neuropathic pain 09/19/2020  . Adjustment disorder with mixed anxiety and depressed mood 09/19/2020  . Facial droop 08/30/2020  . Abnormality of gait 05/09/2020  . Hemiplegia of nondominant side, late effect of cerebrovascular disease ([place]) 03/28/2020  . Pleural effusion 03/01/2020  . Sleep disturbance 02/06/2020  . Spastic hemiplegia of left nondominant side due to nontraumatic intraparenchymal hemorrhage of brain ([place]) 12/26/2019  . Trigger thumb of right hand 11/02/2019  . Obstructive sleep apnea 07/26/2019  . Spastic hemiplegia  of left nondominant side as late effect of nontraumatic intraparenchymal hemorrhage of brain ([place]) 04/06/2019  . Spastic hemiparesis affecting dominant side ([place]) 01/04/2018  . Hyperglycemia 12/29/2016  . Anxiety and depression 12/29/2016  . Muscle spasticity   . Obesity (BMI 30.0-34.9) 04/08/2016  . Hyperlipidemia 04/08/2016  . Basal ganglia hemorrhage ([place]) 04/08/2016  . Dysphagia as late effect of cerebrovascular disease   . Migraine with aura and without status migrainosus, not intractable   . Gait disturbance, post-stroke   . Hemiplegia, post-stroke ([place])   . Aphasia, post-stroke   . Dysarthria, post-stroke   . ICH (intracerebral hemorrhage) (HCC) - R basal ganglia due to hypertensive emergency 04/01/2016  . Upper airway cough syndrome 12/06/2015  . PCP NOTES >>>>>>>>>>>>>>>>>>>>>>>>>>>.. 09/25/2015  . Insomnia 12/28/2012  . Annual physical exam 11/23/2011  . Essential hypertension 10/19/2007    Willow Ora, PTA, [facility] [address] [place], [place] [address] [phone] 09/20/20, 3:40 PM   Name: TOSHIKO KEMLER MRN: [id number] Date of Birth: 05-15-1971    Janann August, PT, DPT 09/24/20 5:03 PM

## 2020-09-24 ENCOUNTER — Ambulatory Visit: Payer: No Typology Code available for payment source | Admitting: Physical Therapy

## 2020-09-25 ENCOUNTER — Ambulatory Visit (INDEPENDENT_AMBULATORY_CARE_PROVIDER_SITE_OTHER): Payer: No Typology Code available for payment source | Admitting: Internal Medicine

## 2020-09-25 ENCOUNTER — Other Ambulatory Visit: Payer: Self-pay

## 2020-09-25 ENCOUNTER — Encounter: Payer: Self-pay | Admitting: Internal Medicine

## 2020-09-25 VITALS — BP 119/81 | HR 77 | Temp 98.1°F | Resp 16 | Ht 59.0 in | Wt 170.0 lb

## 2020-09-25 DIAGNOSIS — G4700 Insomnia, unspecified: Secondary | ICD-10-CM

## 2020-09-25 MED ORDER — ZALEPLON 10 MG PO CAPS: 10.0000 mg | ORAL_CAPSULE | Freq: Every evening | ORAL | 0 refills | Status: DC | PRN

## 2020-09-25 NOTE — Progress Notes (Signed)
Subjective:    Patient ID: Abigail Campos, female    DOB: 03-04-1971, 49 y.o.   MRN: [id number]  DOS:  09/25/2020 Type of visit - description: Acute Her main concern is insomnia, trazodone was working well for her but now is not. Her difficulty is staying asleep. She goes to bed at 11 PM and wakes up at 8 AM.  Does not take naps during the daytime.    Review of Systems Anxiety and depression are controlled  Past Medical History:  Diagnosis Date  . Anxiety and depression   . Essential hypertension 10/19/2007   03-2010: metoprolol changed to bystolic (was not feeling well on it, no specific allergy or reaction)    . Hemiplegia ([place])   . Hyperlipemia   . Hypertension   . Migraine   . Neuromuscular disorder ([place])    left sided hemiplegia - has brace for l leg and walks with a cane  . Ovarian cancer ([place])   . Sleep apnea    not wearing c-pap yet  . Stroke [facility]) 2017    Past Surgical History:  Procedure Laterality Date  . ABDOMINAL HYSTERECTOMY  11-14-07   no oophorectomy per surgical report  . CESAREAN SECTION     [id number]  . INGUINAL HERNIA REPAIR     2004    Allergies as of 09/25/2020      Reactions   Bee Venom Anaphylaxis   Peanut-containing Drug Products Anaphylaxis   Hydrocodone Hives   Generalize itching w/o rash   Isovue [iopamidol] Hives, Itching   Pt broke out with one facial hive.  Had itching on her right upper ant and posterior arm w/o evidence of hives.  Pt given water and 25mg  benadryl po.  We observed pt for 30 minutes before d/c.  J. Bohm     Ambien [zolpidem Tartrate] Other (See Comments)   forgetfulness   Aspirin Swelling   Reports blisters w/ ASA but pt reports is ok w/ Motrin, Advil, naproxen   Latex Hives      Medication List       Accurate as of September 25, 2020 11:59 PM. If you have any questions, ask your nurse or doctor.        STOP taking these medications   cephALEXin 500 MG capsule Commonly known as: KEFLEX Stopped by:  Kathlene November, MD   traZODone 100 MG tablet Commonly known as: DESYREL Stopped by: Kathlene November, MD     TAKE these medications   atorvastatin 20 MG tablet Commonly known as: LIPITOR Take 1 tablet (20 mg total) by mouth daily at 6 PM.   baclofen 20 MG tablet Commonly known as: LIORESAL Take 1.5 tablets (30 mg total) by mouth 3 (three) times daily.   buPROPion 100 MG tablet Commonly known as: WELLBUTRIN Take 1 tablet (100 mg total) by mouth 2 (two) times daily.   carvedilol 6.25 MG tablet Commonly known as: COREG Take 1 tablet (6.25 mg total) by mouth 2 (two) times daily with a meal.   clopidogrel 75 MG tablet Commonly known as: PLAVIX Take 1 tablet (75 mg total) by mouth daily.   escitalopram 20 MG tablet Commonly known as: LEXAPRO Take 1 tablet (20 mg total) by mouth daily.   furosemide 20 MG tablet Commonly known as: LASIX Take 1 tablet (20 mg total) by mouth daily.   gabapentin 300 MG capsule Commonly known as: Neurontin Take 1 capsule (300 mg total) by mouth 3 (three) times daily.   lisinopril 2.5 MG  tablet Commonly known as: ZESTRIL Take 1 tablet (2.5 mg total) by mouth daily.   VITAMIN B 12 PO Take 1 tablet by mouth daily.   zaleplon 10 MG capsule Commonly known as: SONATA Take 1 capsule (10 mg total) by mouth at bedtime as needed for sleep. Started by: Kathlene November, MD          Objective:   Physical Exam BP 119/81 (BP Location: Right Arm, Patient Position: Sitting, Cuff Size: Small)   Pulse 77   Temp 98.1 F (36.7 C) (Oral)   Resp 16   Ht 4\' 11"  (1.499 m)   Wt 170 lb (77.1 kg)   SpO2 95%   BMI 34.34 kg/m  General:   Well developed, NAD, BMI noted. HEENT:  Normocephalic . Face symmetric, atraumatic delete Skin: Not pale. Not jaundice Neurologic:  alert & oriented X3.  Otherwise at baseline Psych--  Cognition and judgment appear intact.  Cooperative with normal attention span and concentration.  Behavior appropriate. No anxious or depressed  appearing.      Assessment     Assessment   Prediabetes: A1c 6.0 (April 2017)  HTN Hyperlipidemia Depression, insomnia GERD Stroke 03-2016: [place] (L)  spastic hemiplegia   ICH, right basilar ganglia due to HTN emergency, + encephalopathy, + dysphagia, aphasia, dysarthria Residual L spasticity.  CTA neck 03-2016 neg CTA head 10-17 (-) aneurysm Headaches , migraines dx after 2nd pregnancy, (-) CT head 2014 and 2015 Insomnia   Cough, persisting, saw Dr. Melvyn Novas 11-2015 OSA   PLAN: Depression insomnia: Depression is well controlled, insomnia not well controlled with trazodone, chart is reviewed, she tried Ambien many years ago and she felt it caused her to be forgetful however Sonata worked well for her. Plan: Stop trazodone, start Sonata, watch for excessive somnolence. Also reinforced good sleep habits including no screen time before bedtime, no late eating, etc. She verbalized understanding RTC scheduled for December   This visit occurred during the SARS-CoV-2 public health emergency.  Safety protocols were in place, including screening questions prior to the visit, additional usage of staff PPE, and extensive cleaning of exam room while observing appropriate contact time as indicated for disinfecting solutions.

## 2020-09-25 NOTE — Progress Notes (Signed)
Pre visit review using our clinic review tool, if applicable. No additional management support is needed unless otherwise documented below in the visit note. 

## 2020-09-25 NOTE — Patient Instructions (Signed)
Stop trazodone  Start Sonata ( zaleplon)  10 mg at bedtime.  Call for a refill when needed  If it make you too sleepy the next morning let me know  See you in December

## 2020-09-26 NOTE — Assessment & Plan Note (Signed)
Depression insomnia: Depression is well controlled, insomnia not well controlled with trazodone, chart is reviewed, she tried Ambien many years ago and she felt it caused her to be forgetful however Sonata worked well for her. Plan: Stop trazodone, start Sonata, watch for excessive somnolence. Also reinforced good sleep habits including no screen time before bedtime, no late eating, etc. She verbalized understanding RTC scheduled for December

## 2020-09-27 ENCOUNTER — Ambulatory Visit: Payer: No Typology Code available for payment source | Admitting: Physical Therapy

## 2020-09-27 ENCOUNTER — Other Ambulatory Visit: Payer: Self-pay

## 2020-09-27 ENCOUNTER — Encounter: Payer: Self-pay | Admitting: Physical Therapy

## 2020-09-27 DIAGNOSIS — R29818 Other symptoms and signs involving the nervous system: Secondary | ICD-10-CM | POA: Diagnosis not present

## 2020-09-27 DIAGNOSIS — M6281 Muscle weakness (generalized): Secondary | ICD-10-CM

## 2020-09-27 DIAGNOSIS — I69352 Hemiplegia and hemiparesis following cerebral infarction affecting left dominant side: Secondary | ICD-10-CM

## 2020-09-27 DIAGNOSIS — R2689 Other abnormalities of gait and mobility: Secondary | ICD-10-CM

## 2020-09-27 DIAGNOSIS — Z9181 History of falling: Secondary | ICD-10-CM | POA: Diagnosis not present

## 2020-09-27 NOTE — Patient Instructions (Signed)
Access Code: [email or web address] URL: [email or web address] Date: 09/27/2020 Prepared by: Willow Ora  Exercises Sit to/from Stand in Stride position - 1 x daily - 5 x weekly - 1 sets - 10 reps Seated Knee Extension with Resistance - 1 x daily - 5 x weekly - 1 sets - 10 reps Seated Hamstring Curl with Anchored Resistance - 1 x daily - 5 x weekly - 1 sets - 10 reps Standing Tandem Balance with Unilateral Counter Support - 1 x daily - 5 x weekly - 1 sets - 3 reps - 30 hold Standing Balance in Corner with Eyes Closed - 1 x daily - 5 x weekly - 1 sets - 3 reps - 30 hold Standing Balance in Corner with Eyes Closed - 1 x daily - 5 x weekly - 1 sets - 10 reps

## 2020-09-27 NOTE — Therapy (Signed)
[facility] [address] [place], [place], [address] Phone: [phone]   Fax:  [phone]  Physical Therapy Treatment  Patient Details  Name: Abigail Campos MRN: [id number] Date of Birth: 1971-05-02 Referring Provider (PT): Mackie Pai, [place]   Encounter Date: 09/27/2020   PT End of Session - 09/27/20 1109    Visit Number 3    Number of Visits 17    Date for PT Re-Evaluation 12/11/20   written for 8 week POC   Authorization Type Aet[employer]    PT Start Time 1104    PT Stop Time 1143    PT Time Calculation (min) 39 min    Equipment Utilized During Treatment Gait belt    Activity Tolerance Patient tolerated treatment well    Behavior During Therapy [facility] for tasks assessed/performed           Past Medical History:  Diagnosis Date  . Anxiety and depression   . Essential hypertension 10/19/2007   03-2010: metoprolol changed to bystolic (was not feeling well on it, no specific allergy or reaction)    . Hemiplegia ([place])   . Hyperlipemia   . Hypertension   . Migraine   . Neuromuscular disorder ([place])    left sided hemiplegia - has brace for l leg and walks with a cane  . Ovarian cancer ([place])   . Sleep apnea    not wearing c-pap yet  . Stroke [facility]) 2017    Past Surgical History:  Procedure Laterality Date  . ABDOMINAL HYSTERECTOMY  11-14-07   no oophorectomy per surgical report  . CESAREAN SECTION     [id number]  . INGUINAL HERNIA REPAIR     2004    There were no vitals filed for this visit.   Subjective Assessment - 09/27/20 1106    Subjective Spoke with Dr. Larose Kells on 10/13 about her BP being low and dropping since her medication was changed. He wants her to keep monioring it till next week and if it is still dropping he will then adjust her meds again. No falls or pain to report. HEP is challenging, especially the last one on the list.    Pertinent History PMH: HTN, hx of CVA (2017), ovarian cancer)     Limitations Walking;Standing    Patient Stated Goals wants to be able to stand on her left leg, improve the balance.    Currently in Pain? No/denies    Pain Score 0-No pain               [facility] Adult PT Treatment/Exercise - 09/27/20 1110      Transfers   Transfers Sit to Stand;Stand to Sit    Sit to Stand 5: Supervision;With upper extremity assist;From bed;From chair/3-in-1    Stand to Sit 5: Supervision;With upper extremity assist;To bed;To chair/3-in-1      Ambulation/Gait   Ambulation/Gait Yes    Ambulation/Gait Assistance 5: Supervision    Ambulation/Gait Assistance Details around clinic with session    Assistive device Straight cane;Other (Comment)   left AFO   Gait Pattern Decreased stance time - left;Abducted - left;Wide base of support;Step-through pattern;Decreased step length - right;Decreased hip/knee flexion - left;Decreased weight shift to left    Ambulation Surface Level;Indoor      Exercises   Exercises Other Exercises    Other Exercises  Reviewed strengthening ex's issued at last session with cues needed on correct technique. Added balance ex's to HEP this session. Cues needed on correct form and  technique. Refer to [place] program for full details.            Issued the following to HEP today.  Access Code: [email or web address] URL: [email or web address] Date: 09/27/2020 Prepared by: Willow Ora  Exercises Sit to/from Stand in Stride position - 1 x daily - 5 x weekly - 1 sets - 10 reps Seated Knee Extension with Resistance - 1 x daily - 5 x weekly - 1 sets - 10 reps Seated Hamstring Curl with Anchored Resistance - 1 x daily - 5 x weekly - 1 sets - 10 reps Standing Tandem Balance with Unilateral Counter Support - 1 x daily - 5 x weekly - 1 sets - 3 reps - 30 hold Standing Balance in Corner with Eyes Closed - 1 x daily - 5 x weekly - 1 sets - 3 reps - 30 hold Standing Balance in Corner with Eyes Closed - 1 x daily - 5 x weekly - 1 sets - 10  reps       PT Education - 09/27/20 1630    Education Details HEP for balance    Person(s) Educated Patient    Methods Explanation;Demonstration;Verbal cues;Handout    Comprehension Verbalized understanding;Returned demonstration;Verbal cues required;Need further instruction            PT Short Term Goals - 09/24/20 1701      PT SHORT TERM GOAL #1   Title Pt will be independent with initial HEP for improved strength, balance, and gait. ALL STGS DUE 10/10/20    Time 4    Period Weeks    Status New    Target Date 10/10/20      PT SHORT TERM GOAL #2   Title Pt will undergo further assessment of BERG - LTG to be written as appropriate.    Baseline pt scoring 39/56    Time 4    Period Weeks    Status Achieved      PT SHORT TERM GOAL #3   Title Pt will decr TUG time to 35 seconds or less with SPC with quad tip in order to demo decr fall risk.    Baseline 39.06 seconds    Time 4    Period Weeks    Status New      PT SHORT TERM GOAL #4   Title Pt will improve gait speed to at least 1.4 ft/sec in order to demo decr fall risk.    Baseline 1.15 ft/sec    Time 4    Period Weeks    Status New             PT Long Term Goals - 09/24/20 1701      PT LONG TERM GOAL #1   Title patient to be independent with final HEP for improved functional mobility. ALL LTGS DUE 11/07/20    Time 8    Period Weeks    Status New      PT LONG TERM GOAL #2   Title Pt will improve gait speed to at least 1.8 ft/sec with LRAD in order to demo decr fall risk.    Baseline 1.15 ft/sec    Time 8    Period Weeks    Status New      PT LONG TERM GOAL #3   Title Pt will decr TUG time to 30 seconds or less with SPC with quad tip in order to demo decr fall risk.    Baseline 39.06 seconds    Time 8    Period  Weeks    Status New      PT LONG TERM GOAL #4   Title Pt will ambulate at least 300' over indoor level and outdoor unlevel surfaces with LRAD with supervision in order to demo improved  community mobility.    Time 8    Period Weeks    Status New      PT LONG TERM GOAL #5   Title Pt will perform 5x sit <> stand in 16 seconds or less with no UE support vs. single UE support in order to demo improved BLE strength.    Baseline 18.68 seconds    Time 8    Period Weeks      Additional Long Term Goals   Additional Long Term Goals Yes      PT LONG TERM GOAL #6   Title Pt will improve BERG to at least a 44/56 in order to demo decr fall risk.    Baseline 39/56    Time 8    Period Weeks    Status New                 Plan - 09/27/20 1110    Clinical Impression Statement Today's skilled session initially focused on review of ex's issued last time with cues for correct technique needed. The remainder of the session focused on the addition of balance ex's to HEP. No issues noted or reported in session with performance of new ex's. The pt is progressing toward goals and should benefit from continued PT to progress toward unmet goals.    Personal Factors and Comorbidities Comorbidity 3+;Time since onset of injury/illness/exacerbation;Fitness;Past/Current Experience    Comorbidities HTN, hx of CVA (2017), ovarian cancer    Examination-Activity Limitations Bathing;Locomotion Level;Stand;Transfers;Caring for Others    Examination-Participation Restrictions Community Activity    Stability/Clinical Decision Making Stable/Uncomplicated    Rehab Potential Good    PT Frequency 2x / week    PT Duration 8 weeks    PT Treatment/Interventions ADLs/Self Care Home Management;Aquatic Therapy;DME Instruction;Gait training;Functional mobility training;Therapeutic activities;Stair training;Therapeutic exercise;Balance training;Neuromuscular re-education;Orthotic Fit/Training;Patient/family education;Passive range of motion;Vestibular    PT Next Visit Plan continue to work on gait mechanics, balance and strengthening    PT Home Exercise Plan Access Code: [email or web address]    Consulted and Agree with  Plan of Care Patient           Patient will benefit from skilled therapeutic intervention in order to improve the following deficits and impairments:  Abnormal gait, Decreased activity tolerance, Decreased balance, Decreased coordination, Decreased endurance, Decreased strength, Difficulty walking, Impaired sensation  Visit Diagnosis: Hemiplegia and hemiparesis following cerebral infarction affecting left dominant side (HCC)  Other abnormalities of gait and mobility  Muscle weakness (generalized)  Other symptoms and signs involving the nervous system  History of falling     Problem List Patient Active Problem List   Diagnosis Date Noted  . Neuropathic pain 09/19/2020  . Adjustment disorder with mixed anxiety and depressed mood 09/19/2020  . Facial droop 08/30/2020  . Abnormality of gait 05/09/2020  . Hemiplegia of nondominant side, late effect of cerebrovascular disease ([place]) 03/28/2020  . Pleural effusion 03/01/2020  . Sleep disturbance 02/06/2020  . Spastic hemiplegia of left nondominant side due to nontraumatic intraparenchymal hemorrhage of brain ([place]) 12/26/2019  . Trigger thumb of right hand 11/02/2019  . Obstructive sleep apnea 07/26/2019  . Spastic hemiplegia of left nondominant side as late effect of nontraumatic intraparenchymal hemorrhage of brain ([place]) 04/06/2019  . Spastic hemiparesis affecting  dominant side ([place]) 01/04/2018  . Hyperglycemia 12/29/2016  . Anxiety and depression 12/29/2016  . Muscle spasticity   . Obesity (BMI 30.0-34.9) 04/08/2016  . Hyperlipidemia 04/08/2016  . Basal ganglia hemorrhage ([place]) 04/08/2016  . Dysphagia as late effect of cerebrovascular disease   . Migraine with aura and without status migrainosus, not intractable   . Gait disturbance, post-stroke   . Hemiplegia, post-stroke ([place])   . Aphasia, post-stroke   . Dysarthria, post-stroke   . ICH (intracerebral hemorrhage) (HCC) - R basal ganglia due to hypertensive emergency  04/01/2016  . Upper airway cough syndrome 12/06/2015  . PCP NOTES >>>>>>>>>>>>>>>>>>>>>>>>>>>.. 09/25/2015  . Insomnia 12/28/2012  . Annual physical exam 11/23/2011  . Essential hypertension 10/19/2007    Willow Ora, PTA, [facility] [address], [place], [place] [address] [phone] 09/28/20, 2:03 PM   Name: Abigail Campos MRN: [id number] Date of Birth: Aug 20, 1971

## 2020-10-01 ENCOUNTER — Encounter: Payer: Self-pay | Admitting: Physical Therapy

## 2020-10-01 ENCOUNTER — Ambulatory Visit: Payer: No Typology Code available for payment source | Admitting: Physical Therapy

## 2020-10-01 ENCOUNTER — Other Ambulatory Visit: Payer: Self-pay

## 2020-10-01 DIAGNOSIS — M6281 Muscle weakness (generalized): Secondary | ICD-10-CM | POA: Diagnosis not present

## 2020-10-01 DIAGNOSIS — R2689 Other abnormalities of gait and mobility: Secondary | ICD-10-CM

## 2020-10-01 DIAGNOSIS — Z9181 History of falling: Secondary | ICD-10-CM | POA: Diagnosis not present

## 2020-10-01 DIAGNOSIS — I69352 Hemiplegia and hemiparesis following cerebral infarction affecting left dominant side: Secondary | ICD-10-CM | POA: Diagnosis not present

## 2020-10-01 DIAGNOSIS — R29818 Other symptoms and signs involving the nervous system: Secondary | ICD-10-CM | POA: Diagnosis not present

## 2020-10-02 NOTE — Therapy (Signed)
[facility] [address] [place], [place], [address] Phone: [phone]   Fax:  [phone]  Physical Therapy Treatment  Patient Details  Name: Abigail Campos MRN: [id number] Date of Birth: 1971/08/09 Referring Provider (PT): Mackie Pai, [place]   Encounter Date: 10/01/2020   PT End of Session - 10/01/20 1411    Visit Number 4    Number of Visits 17    Date for PT Re-Evaluation 12/11/20   written for 8 week POC   Authorization Type Aetna    PT Start Time 1405    PT Stop Time 1445    PT Time Calculation (min) 40 min    Equipment Utilized During Treatment Gait belt    Activity Tolerance Patient tolerated treatment well    Behavior During Therapy [facility] for tasks assessed/performed           Past Medical History:  Diagnosis Date  . Anxiety and depression   . Essential hypertension 10/19/2007   03-2010: metoprolol changed to bystolic (was not feeling well on it, no specific allergy or reaction)    . Hemiplegia ([place])   . Hyperlipemia   . Hypertension   . Migraine   . Neuromuscular disorder ([place])    left sided hemiplegia - has brace for l leg and walks with a cane  . Ovarian cancer ([place])   . Sleep apnea    not wearing c-pap yet  . Stroke [facility]) 2017    Past Surgical History:  Procedure Laterality Date  . ABDOMINAL HYSTERECTOMY  11-14-07   no oophorectomy per surgical report  . CESAREAN SECTION     [id number]  . INGUINAL HERNIA REPAIR     2004    There were no vitals filed for this visit.   Subjective Assessment - 10/01/20 1408    Subjective New ex's are going well, still challenging to close eyes. BP has leveled off, no longer dropping with no medication changes.    Pertinent History PMH: HTN, hx of CVA (2017), ovarian cancer)    Limitations Walking;Standing    Patient Stated Goals wants to be able to stand on her left leg, improve the balance.    Currently in Pain? Yes    Pain Score 5     Pain  Location Foot    Pain Orientation Left    Pain Descriptors / Indicators Numbness;Cramping;Throbbing;Tightness    Pain Type Chronic pain;Neuropathic pain    Pain Onset More than a month ago    Pain Frequency Constant   varies in intensity   Aggravating Factors  weight bearing in brace    Pain Relieving Factors taking brace off                             [facility] Adult PT Treatment/Exercise - 10/01/20 1411      Transfers   Transfers Sit to Stand;Stand to Sit    Sit to Stand 5: Supervision;With upper extremity assist;From bed;From chair/3-in-1    Stand to Sit 5: Supervision;With upper extremity assist;To bed;To chair/3-in-1      Ambulation/Gait   Ambulation/Gait Yes    Ambulation/Gait Assistance 5: Supervision;4: Min guard    Ambulation/Gait Assistance Details focued on trial of different LLE AFO's. used anterior [place] brace 1st with pt's knee more flexed in stance with decreased stance time, step length weight shifting noted. used anterior [place] next with pt reporting improved comfort. notable increase in step length, stance time and  improved knee extension with gait/stance.     Ambulation Distance (Feet) 80 Feet   x1, 115 x2 with ottobock; around gym with personal brace   Assistive device Straight cane;Other (Comment)   multiple AFO's   Ambulation Surface Level;Indoor    Ramp Other (comment)   min guard assist   Ramp Details (indicate cue type and reason) with cane/Ottobock brace on LLE               Balance Exercises - 10/01/20 1444      Balance Exercises: Standing   Standing Eyes Closed Wide (BOA);Head turns;Foam/compliant surface;Other reps (comment);30 secs;Limitations    Standing Eyes Closed Limitations on airex with occasional touch to sturdy surface: EC no head movements, progressing to EC head movements left<>right, up<>down. multiple posterior balance losses needing min assist to correct.                PT Short Term Goals - 09/24/20 1701        PT SHORT TERM GOAL #1   Title Pt will be independent with initial HEP for improved strength, balance, and gait. ALL STGS DUE 10/10/20    Time 4    Period Weeks    Status New    Target Date 10/10/20      PT SHORT TERM GOAL #2   Title Pt will undergo further assessment of BERG - LTG to be written as appropriate.    Baseline pt scoring 39/56    Time 4    Period Weeks    Status Achieved      PT SHORT TERM GOAL #3   Title Pt will decr TUG time to 35 seconds or less with SPC with quad tip in order to demo decr fall risk.    Baseline 39.06 seconds    Time 4    Period Weeks    Status New      PT SHORT TERM GOAL #4   Title Pt will improve gait speed to at least 1.4 ft/sec in order to demo decr fall risk.    Baseline 1.15 ft/sec    Time 4    Period Weeks    Status New             PT Long Term Goals - 09/24/20 1701      PT LONG TERM GOAL #1   Title patient to be independent with final HEP for improved functional mobility. ALL LTGS DUE 11/07/20    Time 8    Period Weeks    Status New      PT LONG TERM GOAL #2   Title Pt will improve gait speed to at least 1.8 ft/sec with LRAD in order to demo decr fall risk.    Baseline 1.15 ft/sec    Time 8    Period Weeks    Status New      PT LONG TERM GOAL #3   Title Pt will decr TUG time to 30 seconds or less with SPC with quad tip in order to demo decr fall risk.    Baseline 39.06 seconds    Time 8    Period Weeks    Status New      PT LONG TERM GOAL #4   Title Pt will ambulate at least 300' over indoor level and outdoor unlevel surfaces with LRAD with supervision in order to demo improved community mobility.    Time 8    Period Weeks    Status New  PT LONG TERM GOAL #5   Title Pt will perform 5x sit <> stand in 16 seconds or less with no UE support vs. single UE support in order to demo improved BLE strength.    Baseline 18.68 seconds    Time 8    Period Weeks      Additional Long Term Goals   Additional Long  Term Goals Yes      PT LONG TERM GOAL #6   Title Pt will improve BERG to at least a 44/56 in order to demo decr fall risk.    Baseline 39/56    Time 8    Period Weeks    Status New                 Plan - 10/01/20 1411    Clinical Impression Statement Today's skilled session initially focused on trial of different AFO's on pt's LLE with pt preferring the anterior Ottobock brace to hers and the other brace trialed today. Will benefit from continued assessment of different braces and potentially an orthotic consult. Remainder of session continued to focus on balance reactions on compliant surfaces with vision removed with up to min assist needed due to postural sway. The pt is progressing toward goals and should benefit from continued PT to progress toward unmet goals.    Personal Factors and Comorbidities Comorbidity 3+;Time since onset of injury/illness/exacerbation;Fitness;Past/Current Experience    Comorbidities HTN, hx of CVA (2017), ovarian cancer    Examination-Activity Limitations Bathing;Locomotion Level;Stand;Transfers;Caring for Others    Examination-Participation Restrictions Community Activity    Stability/Clinical Decision Making Stable/Uncomplicated    Rehab Potential Good    PT Frequency 2x / week    PT Duration 8 weeks    PT Treatment/Interventions ADLs/Self Care Home Management;Aquatic Therapy;DME Instruction;Gait training;Functional mobility training;Therapeutic activities;Stair training;Therapeutic exercise;Balance training;Neuromuscular re-education;Orthotic Fit/Training;Patient/family education;Passive range of motion;Vestibular    PT Next Visit Plan continue to work on gait mechanics with anterior Ottobock brace (? orthotic consult as pt reports her brace does not fit well anymore due to weight changes), balance and strengthening    PT Home Exercise Plan Access Code: [email or web address]    Consulted and Agree with Plan of Care Patient           Patient will benefit from  skilled therapeutic intervention in order to improve the following deficits and impairments:  Abnormal gait, Decreased activity tolerance, Decreased balance, Decreased coordination, Decreased endurance, Decreased strength, Difficulty walking, Impaired sensation  Visit Diagnosis: Hemiplegia and hemiparesis following cerebral infarction affecting left dominant side (HCC)  Other abnormalities of gait and mobility  Muscle weakness (generalized)     Problem List Patient Active Problem List   Diagnosis Date Noted  . Neuropathic pain 09/19/2020  . Adjustment disorder with mixed anxiety and depressed mood 09/19/2020  . Facial droop 08/30/2020  . Abnormality of gait 05/09/2020  . Hemiplegia of nondominant side, late effect of cerebrovascular disease ([place]) 03/28/2020  . Pleural effusion 03/01/2020  . Sleep disturbance 02/06/2020  . Spastic hemiplegia of left nondominant side due to nontraumatic intraparenchymal hemorrhage of brain ([place]) 12/26/2019  . Trigger thumb of right hand 11/02/2019  . Obstructive sleep apnea 07/26/2019  . Spastic hemiplegia of left nondominant side as late effect of nontraumatic intraparenchymal hemorrhage of brain ([place]) 04/06/2019  . Spastic hemiparesis affecting dominant side ([place]) 01/04/2018  . Hyperglycemia 12/29/2016  . Anxiety and depression 12/29/2016  . Muscle spasticity   . Obesity (BMI 30.0-34.9) 04/08/2016  . Hyperlipidemia 04/08/2016  . Basal ganglia  hemorrhage ([place]) 04/08/2016  . Dysphagia as late effect of cerebrovascular disease   . Migraine with aura and without status migrainosus, not intractable   . Gait disturbance, post-stroke   . Hemiplegia, post-stroke ([place])   . Aphasia, post-stroke   . Dysarthria, post-stroke   . ICH (intracerebral hemorrhage) (HCC) - R basal ganglia due to hypertensive emergency 04/01/2016  . Upper airway cough syndrome 12/06/2015  . PCP NOTES >>>>>>>>>>>>>>>>>>>>>>>>>>>.. 09/25/2015  . Insomnia 12/28/2012  . Annual  physical exam 11/23/2011  . Essential hypertension 10/19/2007    Willow Ora, PTA, [facility] [address] [place], [place] [address] [phone] 10/02/20, 6:40 PM   Name: Abigail Campos MRN: [id number] Date of Birth: Dec 06, 1971

## 2020-10-03 ENCOUNTER — Encounter: Payer: Self-pay | Admitting: Physical Therapy

## 2020-10-03 ENCOUNTER — Other Ambulatory Visit: Payer: Self-pay

## 2020-10-03 ENCOUNTER — Ambulatory Visit: Payer: No Typology Code available for payment source | Admitting: Physical Therapy

## 2020-10-03 ENCOUNTER — Encounter (HOSPITAL_BASED_OUTPATIENT_CLINIC_OR_DEPARTMENT_OTHER): Payer: No Typology Code available for payment source | Admitting: Physical Medicine & Rehabilitation

## 2020-10-03 ENCOUNTER — Encounter: Payer: Self-pay | Admitting: Physical Medicine & Rehabilitation

## 2020-10-03 VITALS — BP 138/88 | HR 67 | Temp 98.4°F | Ht 59.0 in | Wt 171.0 lb

## 2020-10-03 DIAGNOSIS — M6281 Muscle weakness (generalized): Secondary | ICD-10-CM

## 2020-10-03 DIAGNOSIS — I69154 Hemiplegia and hemiparesis following nontraumatic intracerebral hemorrhage affecting left non-dominant side: Secondary | ICD-10-CM | POA: Diagnosis not present

## 2020-10-03 DIAGNOSIS — I69352 Hemiplegia and hemiparesis following cerebral infarction affecting left dominant side: Secondary | ICD-10-CM

## 2020-10-03 DIAGNOSIS — Z9181 History of falling: Secondary | ICD-10-CM | POA: Diagnosis not present

## 2020-10-03 DIAGNOSIS — G8114 Spastic hemiplegia affecting left nondominant side: Secondary | ICD-10-CM

## 2020-10-03 DIAGNOSIS — R29818 Other symptoms and signs involving the nervous system: Secondary | ICD-10-CM | POA: Diagnosis not present

## 2020-10-03 DIAGNOSIS — I619 Nontraumatic intracerebral hemorrhage, unspecified: Secondary | ICD-10-CM

## 2020-10-03 DIAGNOSIS — R2689 Other abnormalities of gait and mobility: Secondary | ICD-10-CM | POA: Diagnosis not present

## 2020-10-03 DIAGNOSIS — R269 Unspecified abnormalities of gait and mobility: Secondary | ICD-10-CM

## 2020-10-03 NOTE — Progress Notes (Signed)
Dysport: Procedure Note Patient Name: Abigail Campos DOB: 1970-12-26 MRN: [id number] Date: 10/03/20  Procedure: Botulinum toxin administration Guidance: EMG Diagnosis: Spastic left nondominant IPH Attending: Delice Lesch, MD   Informed consent: Risks, benefits & options of the procedure are explained to the patient (and/or family). The patient elects to proceed with procedure. Risks include but are not limited to weakness, respiratory distress, dry mouth, ptosis, antibody formation, worsening of some areas of function. Benefits include decreased abnormal muscle tone, improved hygiene and positioning, decreased skin breakdown and, in some cases, decreased pain. Options include conservative management with oral antispasticity agents, phenol chemodenervation of nerve or at motor nerve branches. More invasive options include intrathecal balcofen adminstration for appropriate candidates. Surgical options may include tendon lengthening or transposition or, rarely, dorsal rhizotomy.   History/Physical Examination: 49 y.o.  Year old right-handed female with history of hypertension, migraine headaches presents for follow up after right basal ganglia hemorrhage.   Modified [place]: Left 1/4 elbow flexors, 3/4 wrist flexors, 3/4 left finger flexors, 1+/4 ankle plantar flexors.  Previous Treatments: Therapy/Range of motion Indication for guidance: Target active muscules  Procedure: Botulinum toxin was mixed with preservative free saline with a dilution of 0.5cc to 100 units. Targeted limb and muscles were identified. The skin was prepped with alcohol swabs and placement of needle tip in targeted muscle was confirmed using appropriate guidance. Prior to injection, positioning of needle tip outside of blood vessel was determined by pulling back on syringe plunger.  MUSCLE UNITS  Left Med biceps: 100 units Left FCR: 100 units Left FCU: 100 units Left FDS: 450 units Left FDP: 450 units  Left Med  gastroc: 150 units   Left Lat gastroc: 150 units   Total units injected: 8337  Complications: None  Plan: Continue therapies Abigail Campos Abigail Campos 10:36 AM

## 2020-10-04 ENCOUNTER — Telehealth: Payer: Self-pay | Admitting: Physical Therapy

## 2020-10-04 DIAGNOSIS — I69359 Hemiplegia and hemiparesis following cerebral infarction affecting unspecified side: Secondary | ICD-10-CM

## 2020-10-04 DIAGNOSIS — R269 Unspecified abnormalities of gait and mobility: Secondary | ICD-10-CM

## 2020-10-04 NOTE — Therapy (Addendum)
[place] [address] [place], [place], [address] Phone: [phone]   Fax:  [phone]  Physical Therapy Treatment  Patient Details  Name: Abigail Campos MRN: [id number] Date of Birth: May 31, 1971 Referring Provider (PT): Mackie Pai, [place]   Encounter Date: 10/03/2020   PT End of Session - 10/03/20 1456    Visit Number 5    Number of Visits 17    Date for PT Re-Evaluation 12/11/20   written for 8 week POC   Authorization Type Aetna    PT Start Time 1450    PT Stop Time 1530    PT Time Calculation (min) 40 min    Equipment Utilized During Treatment Gait belt    Activity Tolerance Patient tolerated treatment well    Behavior During Therapy [facility] for tasks assessed/performed           Past Medical History:  Diagnosis Date  . Anxiety and depression   . Essential hypertension 10/19/2007   03-2010: metoprolol changed to bystolic (was not feeling well on it, no specific allergy or reaction)    . Hemiplegia ([place])   . Hyperlipemia   . Hypertension   . Migraine   . Neuromuscular disorder ([place])    left sided hemiplegia - has brace for l leg and walks with a cane  . Ovarian cancer ([place])   . Sleep apnea    not wearing c-pap yet  . Stroke [facility]) 2017    Past Surgical History:  Procedure Laterality Date  . ABDOMINAL HYSTERECTOMY  11-14-07   no oophorectomy per surgical report  . CESAREAN SECTION     [id number]  . INGUINAL HERNIA REPAIR     2004    There were no vitals filed for this visit.   Subjective Assessment - 10/03/20 1454    Subjective No new complaitns. Does report left LE feeling stick. Had Botox to left LE today.    Pertinent History PMH: HTN, hx of CVA (2017), ovarian cancer)    Limitations Walking;Standing    Patient Stated Goals wants to be able to stand on her left leg, improve the balance.    Currently in Pain? Yes    Pain Score 5     Pain Location Leg    Pain Orientation Left     Pain Descriptors / Indicators Aching;Tightness    Pain Type Chronic pain;Neuropathic pain    Pain Onset More than a month ago    Pain Frequency Constant    Aggravating Factors  weight bearing, wearing brace    Pain Relieving Factors removing brace, resting                  [facility] Adult PT Treatment/Exercise - 10/03/20 1508      Transfers   Transfers Sit to Stand;Stand to Sit    Sit to Stand 5: Supervision;With upper extremity assist;From bed;From chair/3-in-1    Stand to Sit 5: Supervision;With upper extremity assist;To bed;To chair/3-in-1      Ambulation/Gait   Ambulation/Gait Yes    Ambulation/Gait Assistance 5: Supervision;4: Min guard    Ambulation/Gait Assistance Details continued with trial of anterior Ottobock brace to left LE. supervision on indoor surfaces, min guard assist with outdoor surfaces for safety with no balance loss noted. pt continues to report liking this brace better as it's not as "heavy". continues to need cues for increased left stance time and knee extension in stance.      Ambulation Distance (Feet) 115 Feet  x1, 200 x1   Assistive device Straight cane;Other (Comment)    Gait Pattern Decreased stance time - left;Abducted - left;Wide base of support;Step-through pattern;Decreased step length - right;Decreased hip/knee flexion - left;Decreased weight shift to left    Ambulation Surface Level;Unlevel;Indoor;Outdoor;Paved      Exercises   Exercises Other Exercises    Other Exercises  passive stretching to left LE/ankle- with pt lying on mat table passive DF stretching for 30 sec holds x 5 reps, then passive hamstring stretching for 30 sec holds x 5 reps. with pt seated at edge of mat table with foot on incline- overpressure at ankle for heel cord stretching for 30 sec holds x 5 reps.                 PT Short Term Goals - 09/24/20 1701      PT SHORT TERM GOAL #1   Title Pt will be independent with initial HEP for improved strength, balance, and  gait. ALL STGS DUE 10/10/20    Time 4    Period Weeks    Status New    Target Date 10/10/20      PT SHORT TERM GOAL #2   Title Pt will undergo further assessment of BERG - LTG to be written as appropriate.    Baseline pt scoring 39/56    Time 4    Period Weeks    Status Achieved      PT SHORT TERM GOAL #3   Title Pt will decr TUG time to 35 seconds or less with SPC with quad tip in order to demo decr fall risk.    Baseline 39.06 seconds    Time 4    Period Weeks    Status New      PT SHORT TERM GOAL #4   Title Pt will improve gait speed to at least 1.4 ft/sec in order to demo decr fall risk.    Baseline 1.15 ft/sec    Time 4    Period Weeks    Status New             PT Long Term Goals - 09/24/20 1701      PT LONG TERM GOAL #1   Title patient to be independent with final HEP for improved functional mobility. ALL LTGS DUE 11/07/20    Time 8    Period Weeks    Status New      PT LONG TERM GOAL #2   Title Pt will improve gait speed to at least 1.8 ft/sec with LRAD in order to demo decr fall risk.    Baseline 1.15 ft/sec    Time 8    Period Weeks    Status New      PT LONG TERM GOAL #3   Title Pt will decr TUG time to 30 seconds or less with SPC with quad tip in order to demo decr fall risk.    Baseline 39.06 seconds    Time 8    Period Weeks    Status New      PT LONG TERM GOAL #4   Title Pt will ambulate at least 300' over indoor level and outdoor unlevel surfaces with LRAD with supervision in order to demo improved community mobility.    Time 8    Period Weeks    Status New      PT LONG TERM GOAL #5   Title Pt will perform 5x sit <> stand in 16 seconds or less with no  UE support vs. single UE support in order to demo improved BLE strength.    Baseline 18.68 seconds    Time 8    Period Weeks      Additional Long Term Goals   Additional Long Term Goals Yes      PT LONG TERM GOAL #6   Title Pt will improve BERG to at least a 44/56 in order to demo decr  fall risk.    Baseline 39/56    Time 8    Period Weeks    Status New                 Plan - 10/03/20 1456    Clinical Impression Statement Today's skilled session initially focused on stretching of left LE due to pt just received Botox injections prior to session with no issues reported or noted. Remainder of session continued with trial of anterior Ottobock brace with gait on indoor level and outdoor unlevel surfaces. The pt continues to report liking this brace bettter with gait. Discussed with primary PT, will work on getting an order for a new brace and setting up an orthotic consult. The pt is progressing toward goals and should benefit from continued PT to progress toward unmet goals.    Personal Factors and Comorbidities Comorbidity 3+;Time since onset of injury/illness/exacerbation;Fitness;Past/Current Experience    Comorbidities HTN, hx of CVA (2017), ovarian cancer    Examination-Activity Limitations Bathing;Locomotion Level;Stand;Transfers;Caring for Others    Examination-Participation Restrictions Community Activity    Stability/Clinical Decision Making Stable/Uncomplicated    Rehab Potential Good    PT Frequency 2x / week    PT Duration 8 weeks    PT Treatment/Interventions ADLs/Self Care Home Management;Aquatic Therapy;DME Instruction;Gait training;Functional mobility training;Therapeutic activities;Stair training;Therapeutic exercise;Balance training;Neuromuscular re-education;Orthotic Fit/Training;Patient/family education;Passive range of motion;Vestibular    PT Next Visit Plan continue to work on gait mechanics with anterior Ottobock brace; balance and strengthening. did the order come in for a new brace? set up consult with [employer] [name] has seen her in past)    PT Home Exercise Plan Access Code: [email or web address]    Consulted and Agree with Plan of Care Patient           Patient will benefit from skilled therapeutic intervention in order to improve the following deficits  and impairments:  Abnormal gait, Decreased activity tolerance, Decreased balance, Decreased coordination, Decreased endurance, Decreased strength, Difficulty walking, Impaired sensation  Visit Diagnosis: Hemiplegia and hemiparesis following cerebral infarction affecting left dominant side (HCC)  Other abnormalities of gait and mobility  Muscle weakness (generalized)     Problem List Patient Active Problem List   Diagnosis Date Noted  . Neuropathic pain 09/19/2020  . Adjustment disorder with mixed anxiety and depressed mood 09/19/2020  . Facial droop 08/30/2020  . Abnormality of gait 05/09/2020  . Hemiplegia of nondominant side, late effect of cerebrovascular disease ([place]) 03/28/2020  . Pleural effusion 03/01/2020  . Sleep disturbance 02/06/2020  . Spastic hemiplegia of left nondominant side due to nontraumatic intraparenchymal hemorrhage of brain ([place]) 12/26/2019  . Trigger thumb of right hand 11/02/2019  . Obstructive sleep apnea 07/26/2019  . Spastic hemiplegia of left nondominant side as late effect of nontraumatic intraparenchymal hemorrhage of brain ([place]) 04/06/2019  . Spastic hemiparesis affecting dominant side ([place]) 01/04/2018  . Hyperglycemia 12/29/2016  . Anxiety and depression 12/29/2016  . Muscle spasticity   . Obesity (BMI 30.0-34.9) 04/08/2016  . Hyperlipidemia 04/08/2016  . Basal ganglia hemorrhage ([place]) 04/08/2016  . Dysphagia as late effect  of cerebrovascular disease   . Migraine with aura and without status migrainosus, not intractable   . Gait disturbance, post-stroke   . Hemiplegia, post-stroke ([place])   . Aphasia, post-stroke   . Dysarthria, post-stroke   . ICH (intracerebral hemorrhage) (HCC) - R basal ganglia due to hypertensive emergency 04/01/2016  . Upper airway cough syndrome 12/06/2015  . PCP NOTES >>>>>>>>>>>>>>>>>>>>>>>>>>>.. 09/25/2015  . Insomnia 12/28/2012  . Annual physical exam 11/23/2011  . Essential hypertension 10/19/2007    Willow Ora, PTA, [place] [address], [place], [place] [address] [phone] 10/04/20, 4:57 PM   Name: Abigail Campos MRN: [id number] Date of Birth: [birth year]

## 2020-10-04 NOTE — Telephone Encounter (Addendum)
[name] Below is note sent to me on Dr. Larose Kells pt. Would you help [place] out and ask about AFO order. Not sure what that acronym stands for. Also can you find and order in epic. I or Dr. Larose Kells can sign off. Not sure what AFO is and if this durable medical devise in Epic?  Thanks,  Sherry Ruffing, PA-C ,  Abigail Campos is being treated by physical therapy for weakness and gait instability.  She will benefit from use of new left AFO in order to improve safety with functional mobility as her old one no longer fits appropriately.  If you agree, please submit request in EPIC under MD Order, Other Orders (list left AFO in comments) or fax to [facility] at [phone].   Thank you,   Willow Ora, PTA, [place] [address], [place], [place] [address] [phone] 10/04/20, [place] [address] [place], [place]  [address] Phone:  [phone] Fax:  [phone]

## 2020-10-07 NOTE — Telephone Encounter (Signed)
I think the order as entered will work. I will follow up with the orthotist when he comes to see her in therapy. Thank you.  Willow Ora, PTA, [place] [address], [place], [place] [address] [phone] 10/07/20, 9:37 AM

## 2020-10-07 NOTE — Telephone Encounter (Signed)
Juliann Pulse- I hope I ordered this correctly?

## 2020-10-07 NOTE — Telephone Encounter (Deleted)
What is an AFO?

## 2020-10-07 NOTE — Telephone Encounter (Signed)
Please send the prescription

## 2020-10-08 ENCOUNTER — Encounter: Payer: Self-pay | Admitting: Physical Therapy

## 2020-10-08 ENCOUNTER — Ambulatory Visit: Payer: No Typology Code available for payment source | Admitting: Physical Therapy

## 2020-10-08 ENCOUNTER — Other Ambulatory Visit: Payer: Self-pay

## 2020-10-08 DIAGNOSIS — R2689 Other abnormalities of gait and mobility: Secondary | ICD-10-CM

## 2020-10-08 DIAGNOSIS — Z9181 History of falling: Secondary | ICD-10-CM | POA: Diagnosis not present

## 2020-10-08 DIAGNOSIS — M6281 Muscle weakness (generalized): Secondary | ICD-10-CM | POA: Diagnosis not present

## 2020-10-08 DIAGNOSIS — R29818 Other symptoms and signs involving the nervous system: Secondary | ICD-10-CM | POA: Diagnosis not present

## 2020-10-08 DIAGNOSIS — I69352 Hemiplegia and hemiparesis following cerebral infarction affecting left dominant side: Secondary | ICD-10-CM

## 2020-10-09 ENCOUNTER — Encounter: Payer: No Typology Code available for payment source | Admitting: Psychology

## 2020-10-09 NOTE — Therapy (Signed)
[place] [address] [place], [place], [address] Phone: [phone]   Fax:  [phone]  Physical Therapy Treatment  Patient Details  Name: Abigail Campos MRN: [id number] Date of Birth: [birth year] Referring Provider (PT): Mackie Pai, [place]   Encounter Date: 10/08/2020   PT End of Session - 10/08/20 1407    Visit Number 6    Number of Visits 17    Date for PT Re-Evaluation 12/11/20   written for 8 week POC   Authorization Type Aetna    PT Start Time 1402    PT Stop Time 1445    PT Time Calculation (min) 43 min    Equipment Utilized During Treatment Gait belt    Activity Tolerance Patient tolerated treatment well    Behavior During Therapy [facility] for tasks assessed/performed           Past Medical History:  Diagnosis Date  . Anxiety and depression   . Essential hypertension 10/19/2007   03-2010: metoprolol changed to bystolic (was not feeling well on it, no specific allergy or reaction)    . Hemiplegia ([place])   . Hyperlipemia   . Hypertension   . Migraine   . Neuromuscular disorder ([place])    left sided hemiplegia - has brace for l leg and walks with a cane  . Ovarian cancer ([place])   . Sleep apnea    not wearing c-pap yet  . Stroke [facility]) 2017    Past Surgical History:  Procedure Laterality Date  . ABDOMINAL HYSTERECTOMY  11-14-07   no oophorectomy per surgical report  . CESAREAN SECTION     [id number]  . INGUINAL HERNIA REPAIR     2004    There were no vitals filed for this visit.   Subjective Assessment - 10/08/20 1405    Subjective No new complaints. No falls. Toes still hurt with wearing current brace to left LE. Does feel her muscles have relaxed since getting Botox last week.    Pertinent History PMH: HTN, hx of CVA (2017), ovarian cancer)    Limitations Walking;Standing    Patient Stated Goals wants to be able to stand on her left leg, improve the balance.    Currently in Pain?  No/denies    Pain Score 5     Pain Location Toe (Comment which one)    Pain Orientation Left    Pain Descriptors / Indicators Aching;Tightness    Pain Type Chronic pain;Neuropathic pain    Pain Onset More than a month ago    Pain Frequency Constant    Aggravating Factors  weight bearing, wearing brace    Pain Relieving Factors removing brace, resting                 [facility] Adult PT Treatment/Exercise - 10/08/20 1408      Transfers   Transfers Sit to Stand;Stand to Sit    Sit to Stand 5: Supervision;With upper extremity assist;From bed;From chair/3-in-1    Stand to Sit 5: Supervision;With upper extremity assist;To bed;To chair/3-in-1      Ambulation/Gait   Ambulation/Gait Yes    Ambulation/Gait Assistance 5: Supervision;4: Min guard    Ambulation/Gait Assistance Details pt seen along side Chris from [place] in session today for brace assessment for left LE. Pt walked with her brace (bluerocker) indoors with notable knee flexion, abduction and no heel contact with ground. tried anterior ottobock PLS in gym with same gait set up. pt reports the brace does not hurt her  toes/foot as much as the other one does. With further questioning discovered her brace only hurts after increased distances, therefore walked outdoors with ottobock brace. Pt with reports of toe pain toward end, not as bad as with her brace. Pt noted to not have any heel contact with initial contact or stance phase of gait. Pt reports it is her habit to slide her shoe on with the brace in it with laces already tied so to be independent with donning her brace. discussed this process does not keep pt anchored into the brace which allows her tone to pull her heel up and not touch the ground. After discussion with orthotist pt has had a custom brace in the past and now is in a carbon fiber brace. Best option is to go with a custom carbon fiber brace with an ankle strap to hold her into the brace since she keeps her laces tied. Pt is  in agreement with this plan. The pt is to set up an face to face appt with her MD to establish need for new brace and then call [employer] to schedule. PTA will work on getting an more detailed brace order as well.                       Ambulation Distance (Feet) 500 Feet   x1, plus around gym   Assistive device Straight cane;Other (Comment)   AFO's (pt's and anterior ottobock)   Gait Pattern Decreased stance time - left;Abducted - left;Wide base of support;Step-through pattern;Decreased step length - right;Decreased hip/knee flexion - left;Decreased weight shift to left   decreased heel strike/heel contact on left LE   Ambulation Surface Level;Unlevel;Indoor;Outdoor;Paved                    PT Short Term Goals - 09/24/20 1701      PT SHORT TERM GOAL #1   Title Pt will be independent with initial HEP for improved strength, balance, and gait. ALL STGS DUE 10/10/20    Time 4    Period Weeks    Status New    Target Date 10/10/20      PT SHORT TERM GOAL #2   Title Pt will undergo further assessment of BERG - LTG to be written as appropriate.    Baseline pt scoring 39/56    Time 4    Period Weeks    Status Achieved      PT SHORT TERM GOAL #3   Title Pt will decr TUG time to 35 seconds or less with SPC with quad tip in order to demo decr fall risk.    Baseline 39.06 seconds    Time 4    Period Weeks    Status New      PT SHORT TERM GOAL #4   Title Pt will improve gait speed to at least 1.4 ft/sec in order to demo decr fall risk.    Baseline 1.15 ft/sec    Time 4    Period Weeks    Status New             PT Long Term Goals - 09/24/20 1701      PT LONG TERM GOAL #1   Title patient to be independent with final HEP for improved functional mobility. ALL LTGS DUE 11/07/20    Time 8    Period Weeks    Status New      PT LONG TERM GOAL #2   Title Pt  will improve gait speed to at least 1.8 ft/sec with LRAD in order to demo decr fall risk.    Baseline 1.15 ft/sec    Time 8     Period Weeks    Status New      PT LONG TERM GOAL #3   Title Pt will decr TUG time to 30 seconds or less with SPC with quad tip in order to demo decr fall risk.    Baseline 39.06 seconds    Time 8    Period Weeks    Status New      PT LONG TERM GOAL #4   Title Pt will ambulate at least 300' over indoor level and outdoor unlevel surfaces with LRAD with supervision in order to demo improved community mobility.    Time 8    Period Weeks    Status New      PT LONG TERM GOAL #5   Title Pt will perform 5x sit <> stand in 16 seconds or less with no UE support vs. single UE support in order to demo improved BLE strength.    Baseline 18.68 seconds    Time 8    Period Weeks      Additional Long Term Goals   Additional Long Term Goals Yes      PT LONG TERM GOAL #6   Title Pt will improve BERG to at least a 44/56 in order to demo decr fall risk.    Baseline 39/56    Time 8    Period Weeks    Status New                 Plan - 10/08/20 1407    Clinical Impression Statement Today's skilled session focused on brace assessment of left LE along side Chris from [place] who has made pt's last two braces. See gait section of note for full details. The pt is progressing toward goals and should benefit from continued PT to progress toward unmet goals.    Personal Factors and Comorbidities Comorbidity 3+;Time since onset of injury/illness/exacerbation;Fitness;Past/Current Experience    Comorbidities HTN, hx of CVA (2017), ovarian cancer    Examination-Activity Limitations Bathing;Locomotion Level;Stand;Transfers;Caring for Others    Examination-Participation Restrictions Community Activity    Stability/Clinical Decision Making Stable/Uncomplicated    Rehab Potential Good    PT Frequency 2x / week    PT Duration 8 weeks    PT Treatment/Interventions ADLs/Self Care Home Management;Aquatic Therapy;DME Instruction;Gait training;Functional mobility training;Therapeutic activities;Stair  training;Therapeutic exercise;Balance training;Neuromuscular re-education;Orthotic Fit/Training;Patient/family education;Passive range of motion;Vestibular    PT Next Visit Plan continue to work on heel cord/hamstring stretching, standing balance and LE strengthening.    PT Home Exercise Plan Access Code: [email or web address]    Consulted and Agree with Plan of Care Patient           Patient will benefit from skilled therapeutic intervention in order to improve the following deficits and impairments:  Abnormal gait, Decreased activity tolerance, Decreased balance, Decreased coordination, Decreased endurance, Decreased strength, Difficulty walking, Impaired sensation  Visit Diagnosis: Hemiplegia and hemiparesis following cerebral infarction affecting left dominant side (HCC)  Other abnormalities of gait and mobility  Muscle weakness (generalized)     Problem List Patient Active Problem List   Diagnosis Date Noted  . Neuropathic pain 09/19/2020  . Adjustment disorder with mixed anxiety and depressed mood 09/19/2020  . Facial droop 08/30/2020  . Abnormality of gait 05/09/2020  . Hemiplegia of nondominant side, late effect of cerebrovascular disease ([place]) 03/28/2020  .  Pleural effusion 03/01/2020  . Sleep disturbance 02/06/2020  . Spastic hemiplegia of left nondominant side due to nontraumatic intraparenchymal hemorrhage of brain ([place]) 12/26/2019  . Trigger thumb of right hand 11/02/2019  . Obstructive sleep apnea 07/26/2019  . Spastic hemiplegia of left nondominant side as late effect of nontraumatic intraparenchymal hemorrhage of brain ([place]) 04/06/2019  . Spastic hemiparesis affecting dominant side ([place]) 01/04/2018  . Hyperglycemia 12/29/2016  . Anxiety and depression 12/29/2016  . Muscle spasticity   . Obesity (BMI 30.0-34.9) 04/08/2016  . Hyperlipidemia 04/08/2016  . Basal ganglia hemorrhage ([place]) 04/08/2016  . Dysphagia as late effect of cerebrovascular disease   . Migraine with aura  and without status migrainosus, not intractable   . Gait disturbance, post-stroke   . Hemiplegia, post-stroke ([place])   . Aphasia, post-stroke   . Dysarthria, post-stroke   . ICH (intracerebral hemorrhage) (HCC) - R basal ganglia due to hypertensive emergency 04/01/2016  . Upper airway cough syndrome 12/06/2015  . PCP NOTES >>>>>>>>>>>>>>>>>>>>>>>>>>>.. 09/25/2015  . Insomnia 12/28/2012  . Annual physical exam 11/23/2011  . Essential hypertension 10/19/2007    Willow Ora, PTA, [facility] [address] [place], [place] [address] [phone] 10/09/20, 8:46 PM   Name: Abigail Campos MRN: [id number] Date of Birth: 08-14-1971

## 2020-10-11 ENCOUNTER — Ambulatory Visit: Payer: No Typology Code available for payment source | Admitting: Physical Therapy

## 2020-10-15 ENCOUNTER — Encounter: Payer: Self-pay | Admitting: Medical

## 2020-10-15 ENCOUNTER — Ambulatory Visit: Payer: No Typology Code available for payment source | Attending: Internal Medicine | Admitting: Physical Therapy

## 2020-10-15 ENCOUNTER — Encounter: Payer: Self-pay | Admitting: Internal Medicine

## 2020-10-15 ENCOUNTER — Other Ambulatory Visit: Payer: Self-pay

## 2020-10-15 DIAGNOSIS — R2689 Other abnormalities of gait and mobility: Secondary | ICD-10-CM

## 2020-10-15 DIAGNOSIS — M6281 Muscle weakness (generalized): Secondary | ICD-10-CM | POA: Diagnosis not present

## 2020-10-15 DIAGNOSIS — R269 Unspecified abnormalities of gait and mobility: Secondary | ICD-10-CM | POA: Insufficient documentation

## 2020-10-15 DIAGNOSIS — I69352 Hemiplegia and hemiparesis following cerebral infarction affecting left dominant side: Secondary | ICD-10-CM | POA: Diagnosis not present

## 2020-10-15 NOTE — Therapy (Signed)
[facility] [address] [place], [place], [address] Phone: [phone]   Fax:  [phone]  Physical Therapy Treatment  Patient Details  Name: Abigail Campos MRN: [id number] Date of Birth: [birth year] Referring Provider (PT): Mackie Pai, [place]   Encounter Date: 10/15/2020   PT End of Session - 10/15/20 1234    Visit Number 7    Number of Visits 17    Date for PT Re-Evaluation 12/11/20   written for 8 week POC   Authorization Type [employer]et[employer]    PT Start Time 0272    PT Stop Time 1230    PT Time Calculation (min) 45 min    Equipment Utilized During Treatment Gait belt    Activity Tolerance Patient tolerated treatment well    Behavior During Therapy [facility] for tasks assessed/performed           Past Medical History:  Diagnosis Date   Anxiety and depression    Essential hypertension 10/19/2007   03-2010: metoprolol changed to bystolic (was not feeling well on it, no specific allergy or reaction)     Hemiplegia ([place])    Hyperlipemia    Hypertension    Migraine    Neuromuscular disorder ([place])    left sided hemiplegia - has brace for l leg and walks with a cane   Ovarian cancer ([place])    Sleep apnea    not wearing c-pap yet   Stroke [facility]) 2017    Past Surgical History:  Procedure Laterality Date   ABDOMINAL HYSTERECTOMY  11-14-07   no oophorectomy per surgical report   CESAREAN SECTION     [id number]   INGUINAL HERNIA REPAIR     2004    There were no vitals filed for this visit.   Subjective Assessment - 10/15/20 1156    Subjective No changes since she was last here. Toes still curling a little bit after doing a long amount of walking.    Pertinent History PMH: HTN, hx of CVA (2017), ovarian cancer)    Limitations Walking;Standing    Patient Stated Goals wants to be able to stand on her left leg, improve the balance.    Currently in Pain? No/denies    Pain Onset More than a month ago               [facility] PT Assessment - 10/15/20 0001      Timed Up and Go Test   Normal TUG (seconds) 19.6    TUG Comments with SPC with quad tip                         [facility] Adult PT Treatment/Exercise - 10/15/20 0001      Transfers   Comments x5 reps sit <> stands with 2"block under LLE, chair anteriorly as needed for balance, pt needing single UE support to stand, when in standing, cues for pt to shift hips towards therapist on L for incr weight bearing/shifting, min guard/min A as needed for balance      Ambulation/Gait   Ambulation/Gait Yes    Ambulation/Gait Assistance 5: Supervision    Ambulation/Gait Assistance Details around session with activities with SPC with quad tip    Ambulation Distance (Feet) 200 Feet    Assistive device Straight cane   with 4 prong tip   Gait Pattern Decreased stance time - left;Abducted - left;Wide base of support;Step-through pattern;Decreased step length - right;Decreased hip/knee flexion - left;Decreased weight shift to  left    Ambulation Surface Level;Indoor    Gait velocity 19.5 seconds = 1.68 ft/sec      Therapeutic Activites    Therapeutic Activities Other Therapeutic Activities    Other Therapeutic Activities discussed with pt about pt needing to schedule a face to face visit with pt's physician so that there can be written documentation on why pt would need an AFO in order to continue with the process of obtaining an AFO and then being able to make an appt with [facility], pt verbalized understanding. Pt also reports L toes curl esp after a long day of walking-discussed potentially trialing toe separators between toes for more prolonged stretch (about 20 mins) to see if that will help at the end of the day. pt verbalized understanding                 Balance Exercises - 10/15/20 0001      Balance Exercises: Standing   Standing Eyes Opened Narrow base of support (BOS);Foam/compliant surface;Limitations    Standing Eyes Opened  Limitations feet hip width distance 2 x 5 reps head turns, 2 x 5 reps head nods    Standing Eyes Closed Narrow base of support (BOS);Solid surface;3 reps;20 secs;30 secs    Standing Eyes Closed Limitations feet together    SLS Upper extremity support 1;Eyes open;Solid surface;Limitations    SLS Limitations with 4" step, stepping RLE on step and letting go of bars for a couple of seconds for balance and modified SLS on LLE, needing min guard/min A for balance, cues for quad activation on L stance leg before stepping RLE onto step             PT Education - 10/15/20 1234    Education Details see TA, progress towards goals    Person(s) Educated Patient    Methods Explanation    Comprehension Verbalized understanding            PT Short Term Goals - 10/15/20 1159      PT SHORT TERM GOAL #1   Title Pt will be independent with initial HEP for improved strength, balance, and gait. ALL STGS DUE 10/10/20    Baseline pt reports doing exercises daily.    Time 4    Period Weeks    Status Achieved    Target Date 10/10/20      PT SHORT TERM GOAL #2   Title Pt will undergo further assessment of BERG - LTG to be written as appropriate.    Baseline pt scoring 39/56    Time 4    Period Weeks    Status Achieved      PT SHORT TERM GOAL #3   Title Pt will decr TUG time to 35 seconds or less with SPC with quad tip in order to demo decr fall risk.    Baseline 19.6 seconds with SPC with quad tip on 10/15/20    Time 4    Period Weeks    Status Achieved      PT SHORT TERM GOAL #4   Title Pt will improve gait speed to at least 1.4 ft/sec in order to demo decr fall risk.    Baseline 19.5 seconds = 1.68 ft/sec on 10/15/20    Time 4    Period Weeks    Status Achieved              PT Long Term Goals - 09/24/20 1701      PT LONG TERM GOAL #1  Title patient to be independent with final HEP for improved functional mobility. ALL LTGS DUE 11/07/20    Time 8    Period Weeks    Status New        PT LONG TERM GOAL #2   Title Pt will improve gait speed to at least 1.8 ft/sec with LRAD in order to demo decr fall risk.    Baseline 1.15 ft/sec    Time 8    Period Weeks    Status New      PT LONG TERM GOAL #3   Title Pt will decr TUG time to 30 seconds or less with SPC with quad tip in order to demo decr fall risk.    Baseline 39.06 seconds    Time 8    Period Weeks    Status New      PT LONG TERM GOAL #4   Title Pt will ambulate at least 300' over indoor level and outdoor unlevel surfaces with LRAD with supervision in order to demo improved community mobility.    Time 8    Period Weeks    Status New      PT LONG TERM GOAL #5   Title Pt will perform 5x sit <> stand in 16 seconds or less with no UE support vs. single UE support in order to demo improved BLE strength.    Baseline 18.68 seconds    Time 8    Period Weeks      Additional Long Term Goals   Additional Long Term Goals Yes      PT LONG TERM GOAL #6   Title Pt will improve BERG to at least a 44/56 in order to demo decr fall risk.    Baseline 39/56    Time 8    Period Weeks    Status New                 Plan - 10/15/20 1344    Clinical Impression Statement Assessed STGs today, pt meeting all 4 STGs. Pt performs her HEP consistently everyday. Pt improved gait speed to 1.68 ft/sec with SPC with quad tip (previously 1.5 ft/sec). Pt improved TUG time to 19.6 seconds (previously 39 seconds) - decr pt's risk of falls. Remainder of session focused on balance strategies, NMR towards LLE. Pt tolerated session well - needing min A at times for balance. Revised LTGs as appropriate due to pt's progress with PT.  Will continue to progress towards LTGs.    Personal Factors and Comorbidities Comorbidity 3+;Time since onset of injury/illness/exacerbation;Fitness;Past/Current Experience    Comorbidities HTN, hx of CVA (2017), ovarian cancer    Examination-Activity Limitations Bathing;Locomotion  Level;Stand;Transfers;Caring for Others    Examination-Participation Restrictions Community Activity    Stability/Clinical Decision Making Stable/Uncomplicated    Rehab Potential Good    PT Frequency 2x / week    PT Duration 8 weeks    PT Treatment/Interventions ADLs/Self Care Home Management;Aquatic Therapy;DME Instruction;Gait training;Functional mobility training;Therapeutic activities;Stair training;Therapeutic exercise;Balance training;Neuromuscular re-education;Orthotic Fit/Training;Patient/family education;Passive range of motion;Vestibular    PT Next Visit Plan did pt get a face to face visit for AFO order? continue to work on heel cord/hamstring stretching, standing balance and LE strengthening.    PT Home Exercise Plan Access Code: [email or web address]    Consulted and Agree with Plan of Care Patient           Patient will benefit from skilled therapeutic intervention in order to improve the following deficits and impairments:  Abnormal gait, Decreased  activity tolerance, Decreased balance, Decreased coordination, Decreased endurance, Decreased strength, Difficulty walking, Impaired sensation  Visit Diagnosis: Hemiplegia and hemiparesis following cerebral infarction affecting left dominant side (HCC)  Other abnormalities of gait and mobility  Muscle weakness (generalized)  Abnormality of gait     Problem List Patient Active Problem List   Diagnosis Date Noted   Neuropathic pain 09/19/2020   Adjustment disorder with mixed anxiety and depressed mood 09/19/2020   Facial droop 08/30/2020   Abnormality of gait 05/09/2020   Hemiplegia of nondominant side, late effect of cerebrovascular disease ([place]) 03/28/2020   Pleural effusion 03/01/2020   Sleep disturbance 02/06/2020   Spastic hemiplegia of left nondominant side due to nontraumatic intraparenchymal hemorrhage of brain ([place]) 12/26/2019   Trigger thumb of right hand 11/02/2019   Obstructive sleep apnea 07/26/2019    Spastic hemiplegia of left nondominant side as late effect of nontraumatic intraparenchymal hemorrhage of brain ([place]) 04/06/2019   Spastic hemiparesis affecting dominant side ([place]) 01/04/2018   Hyperglycemia 12/29/2016   Anxiety and depression 12/29/2016   Muscle spasticity    Obesity (BMI 30.0-34.9) 04/08/2016   Hyperlipidemia 04/08/2016   Basal ganglia hemorrhage ([place]) 04/08/2016   Dysphagia as late effect of cerebrovascular disease    Migraine with aura and without status migrainosus, not intractable    Gait disturbance, post-stroke    Hemiplegia, post-stroke (HCC)    Aphasia, post-stroke    Dysarthria, post-stroke    ICH (intracerebral hemorrhage) ([place]) - R basal ganglia due to hypertensive emergency 04/01/2016   Upper airway cough syndrome 12/06/2015   PCP NOTES >>>>>>>>>>>>>>>>>>>>>>>>>>>.. 09/25/2015   Insomnia 12/28/2012   Annual physical exam 11/23/2011   Essential hypertension 10/19/2007    Arliss Journey, PT, DPT  10/15/2020, 1:44 PM  [facility] [address] [place], [place], [address] Phone: [phone]   Fax:  [phone]  Name: NEVAEH KORTE MRN: [id number] Date of Birth: 1971-01-23

## 2020-10-15 NOTE — Telephone Encounter (Signed)
appt scheduled with pt

## 2020-10-18 ENCOUNTER — Encounter: Payer: Self-pay | Admitting: Physical Therapy

## 2020-10-18 ENCOUNTER — Ambulatory Visit: Payer: No Typology Code available for payment source | Admitting: Physical Therapy

## 2020-10-18 ENCOUNTER — Ambulatory Visit: Payer: No Typology Code available for payment source | Admitting: Internal Medicine

## 2020-10-18 ENCOUNTER — Other Ambulatory Visit: Payer: Self-pay

## 2020-10-18 DIAGNOSIS — M6281 Muscle weakness (generalized): Secondary | ICD-10-CM

## 2020-10-18 DIAGNOSIS — I69352 Hemiplegia and hemiparesis following cerebral infarction affecting left dominant side: Secondary | ICD-10-CM

## 2020-10-18 DIAGNOSIS — Z0289 Encounter for other administrative examinations: Secondary | ICD-10-CM

## 2020-10-18 DIAGNOSIS — R269 Unspecified abnormalities of gait and mobility: Secondary | ICD-10-CM | POA: Diagnosis not present

## 2020-10-18 DIAGNOSIS — R2689 Other abnormalities of gait and mobility: Secondary | ICD-10-CM | POA: Diagnosis not present

## 2020-10-18 NOTE — Therapy (Addendum)
[facility] [address] [place], [place], [address] Phone: [phone]   Fax:  [phone]  Physical Therapy Treatment  Patient Details  Name: Abigail Campos MRN: [id number] Date of Birth: 11-01-1971 Referring Provider (PT): Mackie Pai, [place]   Encounter Date: 10/18/2020   PT End of Session - 10/18/20 1240    Visit Number 8    Number of Visits 17    Date for PT Re-Evaluation 12/11/20   written for 8 week POC   Authorization Type Aetna    PT Start Time 1233    PT Stop Time 1313    PT Time Calculation (min) 40 min    Equipment Utilized During Treatment Gait belt    Activity Tolerance Patient tolerated treatment well    Behavior During Therapy [facility] for tasks assessed/performed           Past Medical History:  Diagnosis Date   Anxiety and depression    Essential hypertension 10/19/2007   03-2010: metoprolol changed to bystolic (was not feeling well on it, no specific allergy or reaction)     Hemiplegia ([place])    Hyperlipemia    Hypertension    Migraine    Neuromuscular disorder ([place])    left sided hemiplegia - has brace for l leg and walks with a cane   Ovarian cancer ([place])    Sleep apnea    not wearing c-pap yet   Stroke [facility]) 2017    Past Surgical History:  Procedure Laterality Date   ABDOMINAL HYSTERECTOMY  11-14-07   no oophorectomy per surgical report   CESAREAN SECTION     [id number]   INGUINAL HERNIA REPAIR     2004    There were no vitals filed for this visit.   Subjective Assessment - 10/18/20 1239    Subjective No new complaints. No falls. Does report feeling tight due to cold weather. Has her face to face next Tuesday for the new AFO.    Pertinent History PMH: HTN, hx of CVA (2017), ovarian cancer)    Limitations Walking;Standing    Patient Stated Goals wants to be able to stand on her left leg, improve the balance.    Currently in Pain? No/denies    Pain Score 0-No  pain                             [facility] Adult PT Treatment/Exercise - 10/18/20 1247      Transfers   Transfers Sit to Stand;Stand to Sit    Sit to Stand 5: Supervision;With upper extremity assist;From bed;From chair/3-in-1    Stand to Sit 5: Supervision;With upper extremity assist;To bed;To chair/3-in-1      Ambulation/Gait   Ambulation/Gait Yes    Ambulation/Gait Assistance 5: Supervision    Ambulation/Gait Assistance Details around gym with session    Assistive device Straight cane    Gait Pattern Decreased stance time - left;Abducted - left;Wide base of support;Step-through pattern;Decreased step length - right;Decreased hip/knee flexion - left;Decreased weight shift to left    Ambulation Surface Level;Indoor      High Level Balance   High Level Balance Activities Side stepping;Marching forwards;Backward walking    High Level Balance Comments on blue mat next to counter with right UE support on counter- 3 laps each/each way with cues on form and technique. Min guard assist for safety.      Exercises   Exercises Other Exercises  Other Exercises  standing at bottom of steps with right UE support on rail:left foot on step, lifting right foot up into air/back down to floor for 10 reps. Min guard assist for safety. Cues on form/technique.      Knee/Hip Exercises: Aerobic   Other Aerobic Scifit on level 2.0 for 8 mintues with goal rpm 45-50, assist to keep left LE in alignment               Balance Exercises - 10/18/20 1310      Balance Exercises: Standing   Standing Eyes Closed Narrow base of support (BOS);Foam/compliant surface;3 reps;30 secs;Limitations    Standing Eyes Closed Limitations on airex with feet together EC 30 sec's x3 reps. Min guard assist for safety with mild postural sway noted.               PT Short Term Goals - 10/15/20 1159      PT SHORT TERM GOAL #1   Title Pt will be independent with initial HEP for improved strength,  balance, and gait. ALL STGS DUE 10/10/20    Baseline pt reports doing exercises daily.    Time 4    Period Weeks    Status Achieved    Target Date 10/10/20      PT SHORT TERM GOAL #2   Title Pt will undergo further assessment of BERG - LTG to be written as appropriate.    Baseline pt scoring 39/56    Time 4    Period Weeks    Status Achieved      PT SHORT TERM GOAL #3   Title Pt will decr TUG time to 35 seconds or less with SPC with quad tip in order to demo decr fall risk.    Baseline 19.6 seconds with SPC with quad tip on 10/15/20    Time 4    Period Weeks    Status Achieved      PT SHORT TERM GOAL #4   Title Pt will improve gait speed to at least 1.4 ft/sec in order to demo decr fall risk.    Baseline 19.5 seconds = 1.68 ft/sec on 10/15/20    Time 4    Period Weeks    Status Achieved             PT Long Term Goals - 09/24/20 1701      PT LONG TERM GOAL #1   Title patient to be independent with final HEP for improved functional mobility. ALL LTGS DUE 11/07/20    Time 8    Period Weeks    Status New      PT LONG TERM GOAL #2   Title Pt will improve gait speed to at least 1.8 ft/sec with LRAD in order to demo decr fall risk.    Baseline 1.15 ft/sec    Time 8    Period Weeks    Status New      PT LONG TERM GOAL #3   Title Pt will decr TUG time to 30 seconds or less with SPC with quad tip in order to demo decr fall risk.    Baseline 39.06 seconds    Time 8    Period Weeks    Status New      PT LONG TERM GOAL #4   Title Pt will ambulate at least 300' over indoor level and outdoor unlevel surfaces with LRAD with supervision in order to demo improved community mobility.    Time 8  Period Weeks    Status New      PT LONG TERM GOAL #5   Title Pt will perform 5x sit <> stand in 16 seconds or less with no UE support vs. single UE support in order to demo improved BLE strength.    Baseline 18.68 seconds    Time 8    Period Weeks      Additional Long Term Goals    Additional Long Term Goals Yes      PT LONG TERM GOAL #6   Title Pt will improve BERG to at least a 44/56 in order to demo decr fall risk.    Baseline 39/56    Time 8    Period Weeks    Status New             10/18/20 1240  Plan  Clinical Impression Statement Today's skilled session continued to focus on balance reactions and Left LE strengthening. No issues note or reported in session. The pt is progressing toward goals and should benefit from continued PT to progress toward unmet goals.  Personal Factors and Comorbidities Comorbidity 3+;Time since onset of injury/illness/exacerbation;Fitness;Past/Current Experience  Comorbidities HTN, hx of CVA (2017), ovarian cancer  Examination-Activity Limitations Bathing;Locomotion Level;Stand;Transfers;Caring for Others  Examination-Participation Restrictions Community Activity  Pt will benefit from skilled therapeutic intervention in order to improve on the following deficits Abnormal gait;Decreased activity tolerance;Decreased balance;Decreased coordination;Decreased endurance;Decreased strength;Difficulty walking;Impaired sensation  Stability/Clinical Decision Making Stable/Uncomplicated  Rehab Potential Good  PT Frequency 2x / week  PT Duration 8 weeks  PT Treatment/Interventions ADLs/Self Care Home Management;Aquatic Therapy;DME Instruction;Gait training;Functional mobility training;Therapeutic activities;Stair training;Therapeutic exercise;Balance training;Neuromuscular re-education;Orthotic Fit/Training;Patient/family education;Passive range of motion;Vestibular  PT Next Visit Plan continue to work on heel cord/hamstring stretching, standing balance and LE strengthening.  PT Home Exercise Plan Access Code: [email or web address]  Consulted and Agree with Plan of Care Patient          Patient will benefit from skilled therapeutic intervention in order to improve the following deficits and impairments:  Abnormal gait, Decreased activity  tolerance, Decreased balance, Decreased coordination, Decreased endurance, Decreased strength, Difficulty walking, Impaired sensation  Visit Diagnosis: Other abnormalities of gait and mobility  Hemiplegia and hemiparesis following cerebral infarction affecting left dominant side (HCC)  Muscle weakness (generalized)     Problem List Patient Active Problem List   Diagnosis Date Noted   Neuropathic pain 09/19/2020   Adjustment disorder with mixed anxiety and depressed mood 09/19/2020   Facial droop 08/30/2020   Abnormality of gait 05/09/2020   Hemiplegia of nondominant side, late effect of cerebrovascular disease ([place]) 03/28/2020   Pleural effusion 03/01/2020   Sleep disturbance 02/06/2020   Spastic hemiplegia of left nondominant side due to nontraumatic intraparenchymal hemorrhage of brain ([place]) 12/26/2019   Trigger thumb of right hand 11/02/2019   Obstructive sleep apnea 07/26/2019   Spastic hemiplegia of left nondominant side as late effect of nontraumatic intraparenchymal hemorrhage of brain ([place]) 04/06/2019   Spastic hemiparesis affecting dominant side ([place]) 01/04/2018   Hyperglycemia 12/29/2016   Anxiety and depression 12/29/2016   Muscle spasticity    Obesity (BMI 30.0-34.9) 04/08/2016   Hyperlipidemia 04/08/2016   Basal ganglia hemorrhage ([place]) 04/08/2016   Dysphagia as late effect of cerebrovascular disease    Migraine with aura and without status migrainosus, not intractable    Gait disturbance, post-stroke    Hemiplegia, post-stroke (HCC)    Aphasia, post-stroke    Dysarthria, post-stroke    ICH (intracerebral hemorrhage) (HCC) - R basal ganglia  due to hypertensive emergency 04/01/2016   Upper airway cough syndrome 12/06/2015   PCP NOTES >>>>>>>>>>>>>>>>>>>>>>>>>>>.. 09/25/2015   Insomnia 12/28/2012   Annual physical exam 11/23/2011   Essential hypertension 10/19/2007    Willow Ora, PTA, [facility] [address], [place], [place] [address] [phone] 10/22/20, 8:40 AM   Name: CORRY STORIE MRN: [id number] Date of Birth: 08/17/1971

## 2020-10-19 ENCOUNTER — Other Ambulatory Visit: Payer: Self-pay | Admitting: Internal Medicine

## 2020-10-22 ENCOUNTER — Ambulatory Visit: Payer: No Typology Code available for payment source | Admitting: Physical Therapy

## 2020-10-22 ENCOUNTER — Other Ambulatory Visit: Payer: Self-pay

## 2020-10-22 ENCOUNTER — Encounter: Payer: Self-pay | Admitting: Physical Therapy

## 2020-10-22 ENCOUNTER — Ambulatory Visit (INDEPENDENT_AMBULATORY_CARE_PROVIDER_SITE_OTHER): Payer: No Typology Code available for payment source | Admitting: Medical

## 2020-10-22 VITALS — BP 113/79 | HR 83

## 2020-10-22 VITALS — BP 117/79 | HR 74 | Resp 18 | Ht 59.0 in | Wt 164.0 lb

## 2020-10-22 DIAGNOSIS — R269 Unspecified abnormalities of gait and mobility: Secondary | ICD-10-CM | POA: Diagnosis not present

## 2020-10-22 DIAGNOSIS — M6281 Muscle weakness (generalized): Secondary | ICD-10-CM | POA: Diagnosis not present

## 2020-10-22 DIAGNOSIS — R2689 Other abnormalities of gait and mobility: Secondary | ICD-10-CM | POA: Diagnosis not present

## 2020-10-22 DIAGNOSIS — M79672 Pain in left foot: Secondary | ICD-10-CM | POA: Diagnosis not present

## 2020-10-22 DIAGNOSIS — I69352 Hemiplegia and hemiparesis following cerebral infarction affecting left dominant side: Secondary | ICD-10-CM | POA: Diagnosis not present

## 2020-10-22 DIAGNOSIS — Z8673 Personal history of transient ischemic attack (TIA), and cerebral infarction without residual deficits: Secondary | ICD-10-CM

## 2020-10-22 DIAGNOSIS — M2140 Flat foot [pes planus] (acquired), unspecified foot: Secondary | ICD-10-CM | POA: Diagnosis not present

## 2020-10-22 NOTE — Progress Notes (Signed)
Subjective:    Patient ID: Abigail Campos, female    DOB: 1971-10-14, 49 y.o.   MRN: [id number]  HPI  Pt has left lower ext brace. Pt has hx of stroke and was given orthopedic brace to keep foot in position. Pt states brace affecting the way she walks. She needs new brace that feet better. She states PT consulted with [facility] and she will get customized brace.   She needs new ankle foot orthosis. This current brace 49 years old. Using brace since 2017.   Pt told has flat feet. Causing some discomfort to medial foot.    Review of Systems  Constitutional: Negative for chills and fatigue.  Respiratory: Negative for cough, chest tightness, shortness of breath and wheezing.   Cardiovascular: Negative for chest pain and palpitations.  Gastrointestinal: Negative for abdominal pain.  Genitourinary: Negative for dysuria.  Musculoskeletal: Negative for back pain.  Skin: Negative for pallor and rash.  Hematological: Negative for adenopathy.  Psychiatric/Behavioral: Negative for agitation, behavioral problems and confusion. The patient is not nervous/anxious.    Past Medical History:  Diagnosis Date  . Anxiety and depression   . Essential hypertension 10/19/2007   03-2010: metoprolol changed to bystolic (was not feeling well on it, no specific allergy or reaction)    . Hemiplegia ([place])   . Hyperlipemia   . Hypertension   . Migraine   . Neuromuscular disorder ([place])    left sided hemiplegia - has brace for l leg and walks with a cane  . Ovarian cancer ([place])   . Sleep apnea    not wearing c-pap yet  . Stroke [facility]) 2017     Social History   Socioeconomic History  . Marital status: Married    Spouse name: Not on file  . Number of children: 2  . Years of education: Not on file  . Highest education level: Not on file  Occupational History  . Occupation: disable d/t strokeTEACHER    Employer: [employer]  Tobacco Use  . Smoking status: Never Smoker  . Smokeless tobacco: Never  Used  Vaping Use  . Vaping Use: Never used  Substance and Sexual Activity  . Alcohol use: No  . Drug use: No  . Sexual activity: Not Currently    Birth control/protection: Surgical  Other Topics Concern  . Not on file  Social History Narrative   Used to live independently, stroke 03-2016, d/c from rehab to his mother's house 04-2016, then moved back w/ her husband as off  02/2020   Daughter lives in [place],   [place] lives w/ pt   Social Determinants of Health   Financial Resource Strain:   . Difficulty of Paying Living Expenses: Not on file  Food Insecurity:   . Worried About Charity fundraiser in the Last Year: Not on file  . Ran Out of Food in the Last Year: Not on file  Transportation Needs:   . Lack of Transportation (Medical): Not on file  . Lack of Transportation (Non-Medical): Not on file  Physical Activity:   . Days of Exercise per Week: Not on file  . Minutes of Exercise per Session: Not on file  Stress:   . Feeling of Stress : Not on file  Social Connections:   . Frequency of Communication with Friends and Family: Not on file  . Frequency of Social Gatherings with Friends and Family: Not on file  . Attends Religious Services: Not on file  . Active Member of Clubs  or Organizations: Not on file  . Attends Archivist Meetings: Not on file  . Marital Status: Not on file  Intimate Partner Violence:   . Fear of Current or Ex-Partner: Not on file  . Emotionally Abused: Not on file  . Physically Abused: Not on file  . Sexually Abused: Not on file    Past Surgical History:  Procedure Laterality Date  . ABDOMINAL HYSTERECTOMY  11-14-07   no oophorectomy per surgical report  . CESAREAN SECTION     [id number]  . INGUINAL HERNIA REPAIR     2004    Family History  Problem Relation Age of Onset  . Diabetes Mother   . Hypertension Mother   . Glaucoma Mother   . Colon cancer Mother        mother age 44  . Rectal cancer Father        dx 67  . Stroke  Paternal Grandfather   . Stroke Maternal Grandfather   . Diabetes Brother   . Hypertension Brother   . Heart disease Maternal Aunt        x 2 did 2012 CAD-CHF  . Diabetes Brother   . Hypertension Brother   . Lung cancer Maternal Aunt   . Breast cancer Neg Hx   . Esophageal cancer Neg Hx   . Stomach cancer Neg Hx     Allergies  Allergen Reactions  . Bee Venom Anaphylaxis  . Peanut-Containing Drug Products Anaphylaxis  . Hydrocodone Hives    Generalize itching w/o rash  . Isovue [Iopamidol] Hives and Itching    Pt broke out with one facial hive.  Had itching on her right upper ant and posterior arm w/o evidence of hives.  Pt given water and 25mg  benadryl po.  We observed pt for 30 minutes before d/c.  J. Bohm    . Ambien [Zolpidem Tartrate] Other (See Comments)    forgetfulness  . Aspirin Swelling    Reports blisters w/ ASA but pt reports is ok w/ Motrin, Advil, naproxen  . Latex Hives    Current Outpatient Medications on File Prior to Visit  Medication Sig Dispense Refill  . atorvastatin (LIPITOR) 20 MG tablet Take 1 tablet (20 mg total) by mouth daily at 6 PM. 90 tablet 3  . Baclofen 5 MG TABS Take 30 mg by mouth. Take TID    . buPROPion (WELLBUTRIN) 100 MG tablet Take 1 tablet (100 mg total) by mouth 2 (two) times daily. 180 tablet 3  . carvedilol (COREG) 6.25 MG tablet Take 1 tablet (6.25 mg total) by mouth 2 (two) times daily with a meal. 180 tablet 1  . clopidogrel (PLAVIX) 75 MG tablet Take 1 tablet (75 mg total) by mouth daily. 90 tablet 3  . Cyanocobalamin (VITAMIN B 12 PO) Take 1 tablet by mouth daily.    Marland Kitchen escitalopram (LEXAPRO) 20 MG tablet Take 1 tablet (20 mg total) by mouth daily. 90 tablet 1  . furosemide (LASIX) 20 MG tablet Take 1 tablet (20 mg total) by mouth daily. 90 tablet 1  . gabapentin (NEURONTIN) 300 MG capsule Take 1 capsule (300 mg total) by mouth 3 (three) times daily. 90 capsule 1  . lisinopril (ZESTRIL) 2.5 MG tablet Take 1 tablet (2.5 mg total) by  mouth daily. 90 tablet 1  . zaleplon (SONATA) 10 MG capsule Take 1 capsule (10 mg total) by mouth at bedtime as needed for sleep. 30 capsule 0   No current facility-administered medications on file prior  to visit.    BP 117/79   Pulse 74   Resp 18   Ht 4\' 11"  (1.499 m)   Wt 164 lb (74.4 kg)   SpO2 98%   BMI 33.12 kg/m        Objective:   Physical Exam  General- No acute distress. Pleasant patient. Neck- Full range of motion, no jvd Lungs- Clear, even and unlabored. Heart- regular rate and rhythm. Neurologic- CNII- XII grossly intact. Left foot- brace removed. Mid faint sore medial aspect. Flat feet.      Assessment & Plan:  Hx of stroke and in need of new ankle foot orthosis.   You PT coordated with [facility]. They recommend new custom fit Ankle foot orthosis.   Filled out pt form today. She will give to PT who can give to [facility].  Follow up as regularly schedule with pcp or as needed

## 2020-10-22 NOTE — Therapy (Signed)
[facility] [address] [place], [place], [address] Phone: [phone]   Fax:  [phone]  Physical Therapy Treatment  Patient Details  Name: Abigail Campos MRN: [id number] Date of Birth: 06-May-1971 Referring Provider (PT): Mackie Pai, [place]   Encounter Date: 10/22/2020   PT End of Session - 10/22/20 1426    Visit Number 9    Number of Visits 17    Date for PT Re-Evaluation 12/11/20   written for 8 week POC   Authorization Type [employer]    PT Start Time 1233    PT Stop Time 1313    PT Time Calculation (min) 40 min    Equipment Utilized During Treatment Gait belt    Activity Tolerance Patient tolerated treatment well    Behavior During Therapy [facility] for tasks assessed/performed           Past Medical History:  Diagnosis Date  . Anxiety and depression   . Essential hypertension 10/19/2007   03-2010: metoprolol changed to bystolic (was not feeling well on it, no specific allergy or reaction)    . Hemiplegia ([place])   . Hyperlipemia   . Hypertension   . Migraine   . Neuromuscular disorder ([place])    left sided hemiplegia - has brace for l leg and walks with a cane  . Ovarian cancer ([place])   . Sleep apnea    not wearing c-pap yet  . Stroke [facility]) 2017    Past Surgical History:  Procedure Laterality Date  . ABDOMINAL HYSTERECTOMY  11-14-07   no oophorectomy per surgical report  . CESAREAN SECTION     [id number]  . INGUINAL HERNIA REPAIR     2004    Vitals:   10/22/20 1238  BP: 113/79  Pulse: 83  SpO2: 99%     Subjective Assessment - 10/22/20 1241    Subjective Feeling a little bit off today. No dizziness, just feeling unsteady. Asking for BP to be checked. Goes to see her doctor after this for the face to face visit for her AFO    Pertinent History PMH: HTN, hx of CVA (2017), ovarian cancer)    Limitations Walking;Standing    Patient Stated Goals wants to be able to stand on her left leg, improve  the balance.    Currently in Pain? No/denies                             [facility] Adult PT Treatment/Exercise - 10/22/20 0001      Transfers   Comments x5 reps sit <> stand with 2" block under RLE for incr weight bearing shifting towards L, using RUE to help push to stand and verbal/manual cues to maintain weight shift to L towards therapist and then sitting back down to mat table               Balance Exercises - 10/22/20 1251      Balance Exercises: Standing   Standing Eyes Opened Foam/compliant surface;Wide (BOA);Head turns    Standing Eyes Opened Limitations on air ex feet apart x10 reps head turns, x10 reps head nods    SLS Eyes open;Upper extremity support 1    SLS Limitations With single UE support single tap with RLE to single floor bubble x10 reps and then forward tap and cross tap x10 reps,with UE support: stepping LLE onto 2" step and then letting go for a couple seconds for modified SLS on  RLE and then stepping back off x6 reps with min guard/min A for balance    Other Standing Exercises with 2" block under RLE for incr weight shifting/bearing towards LLE 2 x 5 reps mini squats using bolster posteriorly as tactile cue when shifting weight posteriorly   Other Standing Exercises Comments standing on blue air ex - reaching with RUE to tap cone in directions (forward/lateral/superior) for incr weight shifting towards LLE x12 reps, one episode of  mod A for pt to regain balance. remainder min guard for balance and pt intermittently needing to tap chair for balance               PT Short Term Goals - 10/15/20 1159      PT SHORT TERM GOAL #1   Title Pt will be independent with initial HEP for improved strength, balance, and gait. ALL STGS DUE 10/10/20    Baseline pt reports doing exercises daily.    Time 4    Period Weeks    Status Achieved    Target Date 10/10/20      PT SHORT TERM GOAL #2   Title Pt will undergo further assessment of BERG - LTG to be  written as appropriate.    Baseline pt scoring 39/56    Time 4    Period Weeks    Status Achieved      PT SHORT TERM GOAL #3   Title Pt will decr TUG time to 35 seconds or less with SPC with quad tip in order to demo decr fall risk.    Baseline 19.6 seconds with SPC with quad tip on 10/15/20    Time 4    Period Weeks    Status Achieved      PT SHORT TERM GOAL #4   Title Pt will improve gait speed to at least 1.4 ft/sec in order to demo decr fall risk.    Baseline 19.5 seconds = 1.68 ft/sec on 10/15/20    Time 4    Period Weeks    Status Achieved             PT Long Term Goals - 09/24/20 1701      PT LONG TERM GOAL #1   Title patient to be independent with final HEP for improved functional mobility. ALL LTGS DUE 11/07/20    Time 8    Period Weeks    Status New      PT LONG TERM GOAL #2   Title Pt will improve gait speed to at least 1.8 ft/sec with LRAD in order to demo decr fall risk.    Baseline 1.15 ft/sec    Time 8    Period Weeks    Status New      PT LONG TERM GOAL #3   Title Pt will decr TUG time to 30 seconds or less with SPC with quad tip in order to demo decr fall risk.    Baseline 39.06 seconds    Time 8    Period Weeks    Status New      PT LONG TERM GOAL #4   Title Pt will ambulate at least 300' over indoor level and outdoor unlevel surfaces with LRAD with supervision in order to demo improved community mobility.    Time 8    Period Weeks    Status New      PT LONG TERM GOAL #5   Title Pt will perform 5x sit <> stand in 16 seconds or less with  no UE support vs. single UE support in order to demo improved BLE strength.    Baseline 18.68 seconds    Time 8    Period Weeks      Additional Long Term Goals   Additional Long Term Goals Yes      PT LONG TERM GOAL #6   Title Pt will improve BERG to at least a 44/56 in order to demo decr fall risk.    Baseline 39/56    Time 8    Period Weeks    Status New                 Plan - 10/22/20  1428    Clinical Impression Statement Today's skilled session focused on balance strategies and weight shifting activities towards LLE. While on compliant air ex surface, pt needing one episode of mod A to regain balance while reaching with RUE towards L outside of BOS. Will continue to progress towards LTGS.    Personal Factors and Comorbidities Comorbidity 3+;Time since onset of injury/illness/exacerbation;Fitness;Past/Current Experience    Comorbidities HTN, hx of CVA (2017), ovarian cancer    Examination-Activity Limitations Bathing;Locomotion Level;Stand;Transfers;Caring for Others    Examination-Participation Restrictions Community Activity    Stability/Clinical Decision Making Stable/Uncomplicated    Rehab Potential Good    PT Frequency 2x / week    PT Duration 8 weeks    PT Treatment/Interventions ADLs/Self Care Home Management;Aquatic Therapy;DME Instruction;Gait training;Functional mobility training;Therapeutic activities;Stair training;Therapeutic exercise;Balance training;Neuromuscular re-education;Orthotic Fit/Training;Patient/family education;Passive range of motion;Vestibular    PT Next Visit Plan will need 10th visit PN dynamic gait activities. continue to work on heel cord/hamstring stretching, standing balance and LE strengthening.    PT Home Exercise Plan Access Code: [email or web address]    Consulted and Agree with Plan of Care Patient           Patient will benefit from skilled therapeutic intervention in order to improve the following deficits and impairments:  Abnormal gait, Decreased activity tolerance, Decreased balance, Decreased coordination, Decreased endurance, Decreased strength, Difficulty walking, Impaired sensation  Visit Diagnosis: Hemiplegia and hemiparesis following cerebral infarction affecting left dominant side (HCC)  Other abnormalities of gait and mobility  Muscle weakness (generalized)  Abnormality of gait     Problem List Patient Active Problem List    Diagnosis Date Noted  . Neuropathic pain 09/19/2020  . Adjustment disorder with mixed anxiety and depressed mood 09/19/2020  . Facial droop 08/30/2020  . Abnormality of gait 05/09/2020  . Hemiplegia of nondominant side, late effect of cerebrovascular disease ([place]) 03/28/2020  . Pleural effusion 03/01/2020  . Sleep disturbance 02/06/2020  . Spastic hemiplegia of left nondominant side due to nontraumatic intraparenchymal hemorrhage of brain ([place]) 12/26/2019  . Trigger thumb of right hand 11/02/2019  . Obstructive sleep apnea 07/26/2019  . Spastic hemiplegia of left nondominant side as late effect of nontraumatic intraparenchymal hemorrhage of brain ([place]) 04/06/2019  . Spastic hemiparesis affecting dominant side ([place]) 01/04/2018  . Hyperglycemia 12/29/2016  . Anxiety and depression 12/29/2016  . Muscle spasticity   . Obesity (BMI 30.0-34.9) 04/08/2016  . Hyperlipidemia 04/08/2016  . Basal ganglia hemorrhage ([place]) 04/08/2016  . Dysphagia as late effect of cerebrovascular disease   . Migraine with aura and without status migrainosus, not intractable   . Gait disturbance, post-stroke   . Hemiplegia, post-stroke ([place])   . Aphasia, post-stroke   . Dysarthria, post-stroke   . ICH (intracerebral hemorrhage) (HCC) - R basal ganglia due to hypertensive emergency 04/01/2016  . Upper airway  cough syndrome 12/06/2015  . PCP NOTES >>>>>>>>>>>>>>>>>>>>>>>>>>>.. 09/25/2015  . Insomnia 12/28/2012  . Annual physical exam 11/23/2011  . Essential hypertension 10/19/2007    Arliss Journey, PT, DPT  10/22/2020, 2:29 PM  [facility] [address] [place], [place], [address] Phone: [phone]   Fax:  [phone]  Name: Abigail Campos MRN: [id number] Date of Birth: 09-Sep-1971

## 2020-10-22 NOTE — Patient Instructions (Addendum)
Hx of stroke and in need of new ankle foot orthosis.   You PT coordated with [facility]. They recommend new custom fit Ankle foot orthosis.   Filled out pt form today. She will give to PT who can give to [facility].  Follow up as regularly schedule with pcp or as needed

## 2020-10-25 ENCOUNTER — Telehealth: Payer: Self-pay | Admitting: Physical Therapy

## 2020-10-25 ENCOUNTER — Other Ambulatory Visit: Payer: Self-pay

## 2020-10-25 ENCOUNTER — Ambulatory Visit: Payer: No Typology Code available for payment source | Admitting: Physical Therapy

## 2020-10-25 DIAGNOSIS — R2689 Other abnormalities of gait and mobility: Secondary | ICD-10-CM | POA: Diagnosis not present

## 2020-10-25 DIAGNOSIS — M6281 Muscle weakness (generalized): Secondary | ICD-10-CM | POA: Diagnosis not present

## 2020-10-25 DIAGNOSIS — I69352 Hemiplegia and hemiparesis following cerebral infarction affecting left dominant side: Secondary | ICD-10-CM | POA: Diagnosis not present

## 2020-10-25 DIAGNOSIS — R269 Unspecified abnormalities of gait and mobility: Secondary | ICD-10-CM

## 2020-10-25 NOTE — Telephone Encounter (Signed)
Dr. Harvie Heck,  Thank you for seeing Jaxson in regards to receiving a new AFO for her LLE.  Can you please specify in the order that it is an ankle foot orthosis (AFO) for LLE?    If you agree, please submit request in EPIC under MD Order, Other Orders (list left AFO in comments) or fax to [facility] at [phone].   Thanks so much, Janann August, PT, DPT 10/25/20 10:06 AM    Outpatient Neuro [facility] [address], [place], [place] [address] [phone]

## 2020-10-25 NOTE — Telephone Encounter (Signed)
Dr. Harvie Heck,  [place], disregard my last message! Just saw the updated order for the AFO that was faxed in.  Thank you, Janann August, PT, DPT 10/25/20 12:42 PM

## 2020-10-25 NOTE — Therapy (Signed)
[facility] [address] [place], [place], [address] Phone: [phone]   Fax:  [phone]  Physical Therapy Treatment/10th Visit Progress Note  Patient Details  Name: Abigail Campos MRN: [id number] Date of Birth: Jun 05, 1971 Referring Provider (PT): Elise Benne  10th Visit Physical Therapy Progress Note  Dates of Reporting Period: 09/12/20 to 10/25/20  Objective Reports: STGs assessed on 10/15/20. Pt met all 4 STGs. Pt performs her HEP consistently everyday. Pt improved gait speed to 1.68 ft/sec with SPC with quad tip (previously 1.5 ft/sec). Pt improved TUG time to 19.6 seconds (previously 39 seconds) - decr pt's risk of falls. Pt continues with LLE weakness, balance impairments, and gait abnormalities and will continue to benefit from skilled PT to improve functional independence and decr fall risk. Pt received an order for a new L AFO and will be receiving a new one shortly through [facility] in order to help improve safety with gait.     Encounter Date: 10/25/2020   PT End of Session - 10/25/20 1651    Visit Number 10    Number of Visits 17    Date for PT Re-Evaluation 12/11/20   written for 8 week POC   Authorization Type Aetna    PT Start Time 1234    PT Stop Time 1314    PT Time Calculation (min) 40 min    Equipment Utilized During Treatment Gait belt    Activity Tolerance Patient tolerated treatment well    Behavior During Therapy WFL for tasks assessed/performed           Past Medical History:  Diagnosis Date  . Anxiety and depression   . Essential hypertension 10/19/2007   03-2010: metoprolol changed to bystolic (was not feeling well on it, no specific allergy or reaction)    . Hemiplegia ([place])   . Hyperlipemia   . Hypertension   . Migraine   . Neuromuscular disorder ([place])    left sided hemiplegia - has brace for l leg and walks with a cane  . Ovarian cancer ([place])   . Sleep apnea    not  wearing c-pap yet  . Stroke [facility]) 2017    Past Surgical History:  Procedure Laterality Date  . ABDOMINAL HYSTERECTOMY  11-14-07   no oophorectomy per surgical report  . CESAREAN SECTION     [id number]  . INGUINAL HERNIA REPAIR     2004    There were no vitals filed for this visit.   Subjective Assessment - 10/25/20 1245    Subjective No changes since last time. Saw the physician the other day for her face to face visit to obtain a new L AFO. No falls. Ws feeling sore after last session.    Pertinent History PMH: HTN, hx of CVA (2017), ovarian cancer)    Limitations Walking;Standing    Patient Stated Goals wants to be able to stand on her left leg, improve the balance.    Currently in Pain? No/denies                             [facility] Adult PT Treatment/Exercise - 10/25/20 1252      Therapeutic Activites    Therapeutic Activities Other Therapeutic Activities    Other Therapeutic Activities Pt out in waiting room without AFO attached due to velcro no longer sticking well - put new velcro on brace in order to keep brace on properly, gave pt info  to call [facility] to schedule appt for AFO due to pt having order in and having a face to face with her physician. Pt called and scheduled appointment for next Wednesday.      Exercises   Exercises Other Exercises    Other Exercises  SciFit level 1.5 with BLE only, use of gait belt around distal thighs for proper positioning to avoid excess LLE hip ABD, for strengthening, ROM, activity tolerance x6 minutes               Balance Exercises - 10/25/20 0001      Balance Exercises: Standing   Step Ups Forward;4 inch;UE support 1;Lateral;Limitations    Step Ups Limitations with LLE, single UE support while performing, needing assist to place foot properly on step, verbal and demo cues for proper technique x10 reps each. Pt needing seated rest break after each activity due to incr LLE fatigue   Other Standing Exercises With  single UE support: lateral stepping with RLE over 2" beam for incr weight shift/bearing towards LLE x10 reps, and then stepping RLE over yardstick x15 reps with no UE support, min guard for balance, cues for hip activation during stance for SLS               PT Short Term Goals - 10/15/20 1159      PT SHORT TERM GOAL #1   Title Pt will be independent with initial HEP for improved strength, balance, and gait. ALL STGS DUE 10/10/20    Baseline pt reports doing exercises daily.    Time 4    Period Weeks    Status Achieved    Target Date 10/10/20      PT SHORT TERM GOAL #2   Title Pt will undergo further assessment of BERG - LTG to be written as appropriate.    Baseline pt scoring 39/56    Time 4    Period Weeks    Status Achieved      PT SHORT TERM GOAL #3   Title Pt will decr TUG time to 35 seconds or less with SPC with quad tip in order to demo decr fall risk.    Baseline 19.6 seconds with SPC with quad tip on 10/15/20    Time 4    Period Weeks    Status Achieved      PT SHORT TERM GOAL #4   Title Pt will improve gait speed to at least 1.4 ft/sec in order to demo decr fall risk.    Baseline 19.5 seconds = 1.68 ft/sec on 10/15/20    Time 4    Period Weeks    Status Achieved             PT Long Term Goals - 09/24/20 1701      PT LONG TERM GOAL #1   Title patient to be independent with final HEP for improved functional mobility. ALL LTGS DUE 11/07/20    Time 8    Period Weeks    Status New      PT LONG TERM GOAL #2   Title Pt will improve gait speed to at least 1.8 ft/sec with LRAD in order to demo decr fall risk.    Baseline 1.15 ft/sec    Time 8    Period Weeks    Status New      PT LONG TERM GOAL #3   Title Pt will decr TUG time to 30 seconds or less with SPC with quad tip in order to  demo decr fall risk.    Baseline 39.06 seconds    Time 8    Period Weeks    Status New      PT LONG TERM GOAL #4   Title Pt will ambulate at least 300' over indoor level  and outdoor unlevel surfaces with LRAD with supervision in order to demo improved community mobility.    Time 8    Period Weeks    Status New      PT LONG TERM GOAL #5   Title Pt will perform 5x sit <> stand in 16 seconds or less with no UE support vs. single UE support in order to demo improved BLE strength.    Baseline 18.68 seconds    Time 8    Period Weeks      Additional Long Term Goals   Additional Long Term Goals Yes      PT LONG TERM GOAL #6   Title Pt will improve BERG to at least a 44/56 in order to demo decr fall risk.    Baseline 39/56    Time 8    Period Weeks    Status New                 Plan - 10/25/20 1658    Clinical Impression Statement See above for 10th visit PN. Focus of today's skilled session was BLE strengthening and SLS activities for weight shift towards LLE. Pt fatigued easily with activities today, esp step ups, and needed seat rest breaks after each. Will continue to progress towards LTGs.    Personal Factors and Comorbidities Comorbidity 3+;Time since onset of injury/illness/exacerbation;Fitness;Past/Current Experience    Comorbidities HTN, hx of CVA (2017), ovarian cancer    Examination-Activity Limitations Bathing;Locomotion Level;Stand;Transfers;Caring for Others    Examination-Participation Restrictions Community Activity    Stability/Clinical Decision Making Stable/Uncomplicated    Rehab Potential Good    PT Frequency 2x / week    PT Duration 8 weeks    PT Treatment/Interventions ADLs/Self Care Home Management;Aquatic Therapy;DME Instruction;Gait training;Functional mobility training;Therapeutic activities;Stair training;Therapeutic exercise;Balance training;Neuromuscular re-education;Orthotic Fit/Training;Patient/family education;Passive range of motion;Vestibular    PT Next Visit Plan NuStep/SciFit. dynamic gait activities. continue to work on heel cord/hamstring stretching, standing balance and LE strengthening.    PT Home Exercise Plan  Access Code: [email or web address]    Consulted and Agree with Plan of Care Patient           Patient will benefit from skilled therapeutic intervention in order to improve the following deficits and impairments:  Abnormal gait, Decreased activity tolerance, Decreased balance, Decreased coordination, Decreased endurance, Decreased strength, Difficulty walking, Impaired sensation  Visit Diagnosis: Other abnormalities of gait and mobility  Hemiplegia and hemiparesis following cerebral infarction affecting left dominant side (HCC)  Muscle weakness (generalized)  Abnormality of gait     Problem List Patient Active Problem List   Diagnosis Date Noted  . Neuropathic pain 09/19/2020  . Adjustment disorder with mixed anxiety and depressed mood 09/19/2020  . Facial droop 08/30/2020  . Abnormality of gait 05/09/2020  . Hemiplegia of nondominant side, late effect of cerebrovascular disease (HCC) 03/28/2020  . Pleural effusion 03/01/2020  . Sleep disturbance 02/06/2020  . Spastic hemiplegia of left nondominant side due to nontraumatic intraparenchymal hemorrhage of brain (HCC) 12/26/2019  . Trigger thumb of right hand 11/02/2019  . Obstructive sleep apnea 07/26/2019  . Spastic hemiplegia of left nondominant side as late effect of nontraumatic intraparenchymal hemorrhage of brain (HCC) 04/06/2019  . Spastic hemiparesis affecting  dominant side ([place]) 01/04/2018  . Hyperglycemia 12/29/2016  . Anxiety and depression 12/29/2016  . Muscle spasticity   . Obesity (BMI 30.0-34.9) 04/08/2016  . Hyperlipidemia 04/08/2016  . Basal ganglia hemorrhage ([place]) 04/08/2016  . Dysphagia as late effect of cerebrovascular disease   . Migraine with aura and without status migrainosus, not intractable   . Gait disturbance, post-stroke   . Hemiplegia, post-stroke ([place])   . Aphasia, post-stroke   . Dysarthria, post-stroke   . ICH (intracerebral hemorrhage) (HCC) - R basal ganglia due to hypertensive emergency 04/01/2016   . Upper airway cough syndrome 12/06/2015  . PCP NOTES >>>>>>>>>>>>>>>>>>>>>>>>>>>.. 09/25/2015  . Insomnia 12/28/2012  . Annual physical exam 11/23/2011  . Essential hypertension 10/19/2007    Arliss Journey, PT, DPT  10/25/2020, 4:59 PM  [facility] [address] [place], [place], [address] Phone: [phone]   Fax:  [phone]  Name: Abigail Campos MRN: [id number] Date of Birth: [birth year]

## 2020-10-29 ENCOUNTER — Other Ambulatory Visit: Payer: Self-pay

## 2020-10-29 ENCOUNTER — Ambulatory Visit: Payer: No Typology Code available for payment source | Admitting: Physical Therapy

## 2020-10-29 DIAGNOSIS — R2689 Other abnormalities of gait and mobility: Secondary | ICD-10-CM

## 2020-10-29 DIAGNOSIS — I69352 Hemiplegia and hemiparesis following cerebral infarction affecting left dominant side: Secondary | ICD-10-CM | POA: Diagnosis not present

## 2020-10-29 DIAGNOSIS — R269 Unspecified abnormalities of gait and mobility: Secondary | ICD-10-CM | POA: Diagnosis not present

## 2020-10-29 DIAGNOSIS — M6281 Muscle weakness (generalized): Secondary | ICD-10-CM

## 2020-10-29 NOTE — Patient Instructions (Signed)
Access Code: [email or web address] URL: [email or web address] Date: 10/29/2020 Prepared by: Janann August  Exercises Sit to/from Stand in Stride position - 1 x daily - 5 x weekly - 1 sets - 10 reps Seated Knee Extension with Resistance - 1 x daily - 5 x weekly - 1 sets - 10 reps Seated Hamstring Curl with Anchored Resistance - 1 x daily - 5 x weekly - 1 sets - 10 reps Standing Tandem Balance with Unilateral Counter Support - 1 x daily - 5 x weekly - 1 sets - 3 reps - 30 hold Standing Balance in Corner with Eyes Closed - 1 x daily - 5 x weekly - 1 sets - 3 reps - 30 hold Standing Balance in Corner with Eyes Closed - 1 x daily - 5 x weekly - 1 sets - 10 reps Seated Vestibular Gaze Fixation with Head Rotation - 1 x daily - 5 x weekly - 3 sets - 10 reps

## 2020-10-29 NOTE — Therapy (Signed)
[facility] [address] [place], [place], [address] Phone: [phone]   Fax:  [phone]  Physical Therapy Treatment  Patient Details  Name: Abigail Campos MRN: [id number] Date of Birth: 08/28/1971 Referring Provider (PT): Mackie Pai, [place]   Encounter Date: 10/29/2020   PT End of Session - 10/29/20 1327    Visit Number 11    Number of Visits 17    Date for PT Re-Evaluation 12/11/20   written for 8 week POC   Authorization Type Aetna    PT Start Time 1237    PT Stop Time 1317    PT Time Calculation (min) 40 min    Equipment Utilized During Treatment Gait belt    Activity Tolerance Patient tolerated treatment well    Behavior During Therapy [facility] for tasks assessed/performed           Past Medical History:  Diagnosis Date  . Anxiety and depression   . Essential hypertension 10/19/2007   03-2010: metoprolol changed to bystolic (was not feeling well on it, no specific allergy or reaction)    . Hemiplegia ([place])   . Hyperlipemia   . Hypertension   . Migraine   . Neuromuscular disorder ([place])    left sided hemiplegia - has brace for l leg and walks with a cane  . Ovarian cancer ([place])   . Sleep apnea    not wearing c-pap yet  . Stroke [facility]) 2017    Past Surgical History:  Procedure Laterality Date  . ABDOMINAL HYSTERECTOMY  11-14-07   no oophorectomy per surgical report  . CESAREAN SECTION     [id number]  . INGUINAL HERNIA REPAIR     2004    There were no vitals filed for this visit.   Subjective Assessment - 10/29/20 1245    Subjective Sees [facility] tomorrow for her new L AFO.    Pertinent History PMH: HTN, hx of CVA (2017), ovarian cancer)    Limitations Walking;Standing    Patient Stated Goals wants to be able to stand on her left leg, improve the balance.    Currently in Pain? No/denies                             [facility] Adult PT Treatment/Exercise - 10/29/20 0001       Ambulation/Gait   Ambulation/Gait Yes    Ambulation/Gait Assistance 5: Supervision;4: Min guard    Ambulation Distance (Feet) 115 Feet    Assistive device Straight cane   with 4 prong tip   Gait Pattern Decreased stance time - left;Abducted - left;Wide base of support;Step-through pattern;Decreased step length - right;Decreased hip/knee flexion - left;Decreased weight shift to left    Ambulation Surface Level;Indoor    Gait Comments performed gait with head motions with pt reporting incr dizziness with head turns R/L - needing to stop upon multiple instances and needing min guard for balance. after stopping for a couple seconds, pt reporting dizziness went away      Therapeutic Activites    Therapeutic Activities Other Therapeutic Activities    Other Therapeutic Activities pt ambulated into clinic without velcro attached to AFO (has lost its stickiness) - therapist providing pt with new velcro for the time being, pt seeing [facility] tomorrow for new AFO and instructed pt to ask if they can reglue/provide pt with new velcro until she receives her new brace. also discussed with pt has 3 more visits left -  will want to add more visits after pt receives her new AFO (will figure this out during next session once pt is told how long her brace will take)       Neuro Re-ed    Neuro Re-ed Details  attempted to assess pt's VOR in horizontal direction with head impulse test after pt reporting incr dizziness with head motions - unable to assess due to pt with neck guarding when therapist was attempting. trialed VOR x1 exercise in horizontal direction with pt reporting incr dizziness. educated pt on purpose of VOR reflex in regards to head motions, esp during gait      Knee/Hip Exercises: Aerobic   Nustep with BLE only for strengthening, ROM x6 minutes at gear 4, with use of gait belt around B thighs to prevent incr hip ABD with LLE           Vestibular Treatment/Exercise - 10/29/20 0001      Vestibular  Treatment/Exercise   Gaze Exercises X1 Viewing Horizontal      X1 Viewing Horizontal   Foot Position seated with back support    Reps --   3 x 10 reps    Comments verbal and demo cues for proper technique/ROM and speed. pt reporting 4-5/10 dizziness that subsided after seconds                   PT Education - 10/29/20 1326    Education Details seated VOR x1 addition to HEP    Person(s) Educated Patient    Methods Explanation;Demonstration;Handout;Verbal cues    Comprehension Verbalized understanding;Returned demonstration;Need further instruction            PT Short Term Goals - 10/15/20 1159      PT SHORT TERM GOAL #1   Title Pt will be independent with initial HEP for improved strength, balance, and gait. ALL STGS DUE 10/10/20    Baseline pt reports doing exercises daily.    Time 4    Period Weeks    Status Achieved    Target Date 10/10/20      PT SHORT TERM GOAL #2   Title Pt will undergo further assessment of BERG - LTG to be written as appropriate.    Baseline pt scoring 39/56    Time 4    Period Weeks    Status Achieved      PT SHORT TERM GOAL #3   Title Pt will decr TUG time to 35 seconds or less with SPC with quad tip in order to demo decr fall risk.    Baseline 19.6 seconds with SPC with quad tip on 10/15/20    Time 4    Period Weeks    Status Achieved      PT SHORT TERM GOAL #4   Title Pt will improve gait speed to at least 1.4 ft/sec in order to demo decr fall risk.    Baseline 19.5 seconds = 1.68 ft/sec on 10/15/20    Time 4    Period Weeks    Status Achieved             PT Long Term Goals - 09/24/20 1701      PT LONG TERM GOAL #1   Title patient to be independent with final HEP for improved functional mobility. ALL LTGS DUE 11/07/20    Time 8    Period Weeks    Status New      PT LONG TERM GOAL #2   Title Pt will improve gait  speed to at least 1.8 ft/sec with LRAD in order to demo decr fall risk.    Baseline 1.15 ft/sec    Time 8      Period Weeks    Status New      PT LONG TERM GOAL #3   Title Pt will decr TUG time to 30 seconds or less with SPC with quad tip in order to demo decr fall risk.    Baseline 39.06 seconds    Time 8    Period Weeks    Status New      PT LONG TERM GOAL #4   Title Pt will ambulate at least 300' over indoor level and outdoor unlevel surfaces with LRAD with supervision in order to demo improved community mobility.    Time 8    Period Weeks    Status New      PT LONG TERM GOAL #5   Title Pt will perform 5x sit <> stand in 16 seconds or less with no UE support vs. single UE support in order to demo improved BLE strength.    Baseline 18.68 seconds    Time 8    Period Weeks      Additional Long Term Goals   Additional Long Term Goals Yes      PT LONG TERM GOAL #6   Title Pt will improve BERG to at least a 44/56 in order to demo decr fall risk.    Baseline 39/56    Time 8    Period Weeks    Status New                 Plan - 10/29/20 1338    Clinical Impression Statement During gait with head motions, pt reporting incr dizziness, esp with horizontal head turns. Attempted to assess VOR with head impulse test, but unable to perform due to pt having incr guarding. Provided pt with seated horizontal VOR x1 for home as pt reporting 4-5/10 dizziness while performing that subsided after a rest. Discussed with pt not wanting to have sx go above a 5/10 at home. Will continue to progress towards LTGs.    Personal Factors and Comorbidities Comorbidity 3+;Time since onset of injury/illness/exacerbation;Fitness;Past/Current Experience    Comorbidities HTN, hx of CVA (2017), ovarian cancer    Examination-Activity Limitations Bathing;Locomotion Level;Stand;Transfers;Caring for Others    Examination-Participation Restrictions Community Activity    Stability/Clinical Decision Making Stable/Uncomplicated    Rehab Potential Good    PT Frequency 2x / week    PT Duration 8 weeks    PT  Treatment/Interventions ADLs/Self Care Home Management;Aquatic Therapy;DME Instruction;Gait training;Functional mobility training;Therapeutic activities;Stair training;Therapeutic exercise;Balance training;Neuromuscular re-education;Orthotic Fit/Training;Patient/family education;Passive range of motion;Vestibular    PT Next Visit Plan how is VOR x1?review and progress as appropriate. when pt is going to get her AFO? (had appt with [facility] on wednesday). NuStep/SciFit. dynamic gait activities. continue to work on heel cord/hamstring stretching, standing balance and LE strengthening.    PT Home Exercise Plan Access Code: [email or web address]    Consulted and Agree with Plan of Care Patient           Patient will benefit from skilled therapeutic intervention in order to improve the following deficits and impairments:  Abnormal gait, Decreased activity tolerance, Decreased balance, Decreased coordination, Decreased endurance, Decreased strength, Difficulty walking, Impaired sensation  Visit Diagnosis: Other abnormalities of gait and mobility  Hemiplegia and hemiparesis following cerebral infarction affecting left dominant side (HCC)  Muscle weakness (generalized)  Abnormality of gait  Problem List Patient Active Problem List   Diagnosis Date Noted  . Neuropathic pain 09/19/2020  . Adjustment disorder with mixed anxiety and depressed mood 09/19/2020  . Facial droop 08/30/2020  . Abnormality of gait 05/09/2020  . Hemiplegia of nondominant side, late effect of cerebrovascular disease ([place]) 03/28/2020  . Pleural effusion 03/01/2020  . Sleep disturbance 02/06/2020  . Spastic hemiplegia of left nondominant side due to nontraumatic intraparenchymal hemorrhage of brain ([place]) 12/26/2019  . Trigger thumb of right hand 11/02/2019  . Obstructive sleep apnea 07/26/2019  . Spastic hemiplegia of left nondominant side as late effect of nontraumatic intraparenchymal hemorrhage of brain ([place]) 04/06/2019  .  Spastic hemiparesis affecting dominant side ([place]) 01/04/2018  . Hyperglycemia 12/29/2016  . Anxiety and depression 12/29/2016  . Muscle spasticity   . Obesity (BMI 30.0-34.9) 04/08/2016  . Hyperlipidemia 04/08/2016  . Basal ganglia hemorrhage ([place]) 04/08/2016  . Dysphagia as late effect of cerebrovascular disease   . Migraine with aura and without status migrainosus, not intractable   . Gait disturbance, post-stroke   . Hemiplegia, post-stroke ([place])   . Aphasia, post-stroke   . Dysarthria, post-stroke   . ICH (intracerebral hemorrhage) (HCC) - R basal ganglia due to hypertensive emergency 04/01/2016  . Upper airway cough syndrome 12/06/2015  . PCP NOTES >>>>>>>>>>>>>>>>>>>>>>>>>>>.. 09/25/2015  . Insomnia 12/28/2012  . Annual physical exam 11/23/2011  . Essential hypertension 10/19/2007    Arliss Journey, PT, DPT  10/29/2020, 1:40 PM  [facility] [address] [place], [place], [address] Phone: [phone]   Fax:  [phone]  Name: Abigail Campos MRN: [id number] Date of Birth: 05-13-1971

## 2020-11-01 ENCOUNTER — Other Ambulatory Visit: Payer: Self-pay

## 2020-11-01 ENCOUNTER — Ambulatory Visit: Payer: No Typology Code available for payment source | Admitting: Physical Therapy

## 2020-11-01 ENCOUNTER — Encounter: Payer: Self-pay | Admitting: Physical Therapy

## 2020-11-01 ENCOUNTER — Other Ambulatory Visit: Payer: Self-pay | Admitting: Physical Medicine & Rehabilitation

## 2020-11-01 DIAGNOSIS — R269 Unspecified abnormalities of gait and mobility: Secondary | ICD-10-CM | POA: Diagnosis not present

## 2020-11-01 DIAGNOSIS — M6281 Muscle weakness (generalized): Secondary | ICD-10-CM | POA: Diagnosis not present

## 2020-11-01 DIAGNOSIS — I69352 Hemiplegia and hemiparesis following cerebral infarction affecting left dominant side: Secondary | ICD-10-CM

## 2020-11-01 DIAGNOSIS — R2689 Other abnormalities of gait and mobility: Secondary | ICD-10-CM | POA: Diagnosis not present

## 2020-11-02 NOTE — Therapy (Signed)
[facility] [address] [place], [place], [address] Phone: [phone]   Fax:  [phone]  Physical Therapy Treatment  Patient Details  Name: Abigail Campos MRN: [id number] Date of Birth: Sep 09, 1971 Referring Provider (PT): Mackie Pai, [place]   Encounter Date: 11/01/2020   PT End of Session - 11/01/20 1243    Visit Number 12    Number of Visits 17    Date for PT Re-Evaluation 12/11/20   written for 8 week POC   Authorization Type Aet[employer]    PT Start Time 1233    PT Stop Time 1315    PT Time Calculation (min) 42 min    Equipment Utilized During Treatment Gait belt    Activity Tolerance Patient tolerated treatment well    Behavior During Therapy [facility] for tasks assessed/performed           Past Medical History:  Diagnosis Date  . Anxiety and depression   . Essential hypertension 10/19/2007   03-2010: metoprolol changed to bystolic (was not feeling well on it, no specific allergy or reaction)    . Hemiplegia ([place])   . Hyperlipemia   . Hypertension   . Migraine   . Neuromuscular disorder ([place])    left sided hemiplegia - has brace for l leg and walks with a cane  . Ovarian cancer ([place])   . Sleep apnea    not wearing c-pap yet  . Stroke [facility]) 2017    Past Surgical History:  Procedure Laterality Date  . ABDOMINAL HYSTERECTOMY  11-14-07   no oophorectomy per surgical report  . CESAREAN SECTION     [id number]  . INGUINAL HERNIA REPAIR     2004    There were no vitals filed for this visit.   Subjective Assessment - 11/01/20 1240    Subjective Saw [name] who made adjustments to her current brace as pt is actively trying to loose weight. Fixed the strap and gave her some wedges to try under the brace. No falls or pain to report.    Pertinent History PMH: HTN, hx of CVA (2017), ovarian cancer)    Limitations Walking;Standing    Patient Stated Goals wants to be able to stand on her left leg, improve the  balance.    Currently in Pain? No/denies    Pain Score 0-No pain                  [facility] Adult PT Treatment/Exercise - 11/01/20 1247      Transfers   Transfers Sit to Stand;Stand to Sit    Sit to Stand 5: Supervision;With upper extremity assist;From bed;From chair/3-in-1    Stand to Sit 5: Supervision;With upper extremity assist;To bed;To chair/3-in-1      Ambulation/Gait   Ambulation/Gait Yes    Assistive device Straight cane   with rubber quad tip; left AFO   Gait Pattern Decreased stance time - left;Abducted - left;Wide base of support;Step-through pattern;Decreased step length - right;Decreased hip/knee flexion - left;Decreased weight shift to left    Ambulation Surface Level;Indoor           Vestibular Treatment/Exercise - 11/01/20 1249      Vestibular Treatment/Exercise   Gaze Exercises X1 Viewing Horizontal;X1 Viewing Vertical      X1 Viewing Horizontal   Foot Position seated no back support, feet hip width apart on solid floor    Time --   20 seconds   Reps 4    Comments cues to keep eyes  on target, move head as fast as possible while it stays in focus. decreased symptoms reported as reps progressed with max dizziness up to 4-5/10.      X1 Viewing Vertical   Foot Position unsupported sitting with feet hip width apart on solid floor    Time --   20 sec's   Reps 4    Comments no dizziness, did report 'X" looking like a "v" afterwards.  this resolved with rest.               Balance Exercises - 11/01/20 1304      Balance Exercises: Standing   Standing Eyes Closed Narrow base of support (BOS);Wide (BOA);Head turns;Foam/compliant surface;Other reps (comment);30 secs;Limitations    Standing Eyes Closed Limitations on airex with feet together EC 30 sec's x3 reps; feet wide apart for head movements left<>right, up<>down. Incr dizziness with up<>down motion only, posterior lean with both needing min/mod assist for balance.                 PT Short Term  Goals - 10/15/20 1159      PT SHORT TERM GOAL #1   Title Pt will be independent with initial HEP for improved strength, balance, and gait. ALL STGS DUE 10/10/20    Baseline pt reports doing exercises daily.    Time 4    Period Weeks    Status Achieved    Target Date 10/10/20      PT SHORT TERM GOAL #2   Title Pt will undergo further assessment of BERG - LTG to be written as appropriate.    Baseline pt scoring 39/56    Time 4    Period Weeks    Status Achieved      PT SHORT TERM GOAL #3   Title Pt will decr TUG time to 35 seconds or less with SPC with quad tip in order to demo decr fall risk.    Baseline 19.6 seconds with SPC with quad tip on 10/15/20    Time 4    Period Weeks    Status Achieved      PT SHORT TERM GOAL #4   Title Pt will improve gait speed to at least 1.4 ft/sec in order to demo decr fall risk.    Baseline 19.5 seconds = 1.68 ft/sec on 10/15/20    Time 4    Period Weeks    Status Achieved             PT Long Term Goals - 09/24/20 1701      PT LONG TERM GOAL #1   Title patient to be independent with final HEP for improved functional mobility. ALL LTGS DUE 11/07/20    Time 8    Period Weeks    Status New      PT LONG TERM GOAL #2   Title Pt will improve gait speed to at least 1.8 ft/sec with LRAD in order to demo decr fall risk.    Baseline 1.15 ft/sec    Time 8    Period Weeks    Status New      PT LONG TERM GOAL #3   Title Pt will decr TUG time to 30 seconds or less with SPC with quad tip in order to demo decr fall risk.    Baseline 39.06 seconds    Time 8    Period Weeks    Status New      PT LONG TERM GOAL #4   Title Pt will  ambulate at least 300' over indoor level and outdoor unlevel surfaces with LRAD with supervision in order to demo improved community mobility.    Time 8    Period Weeks    Status New      PT LONG TERM GOAL #5   Title Pt will perform 5x sit <> stand in 16 seconds or less with no UE support vs. single UE support in  order to demo improved BLE strength.    Baseline 18.68 seconds    Time 8    Period Weeks      Additional Long Term Goals   Additional Long Term Goals Yes      PT LONG TERM GOAL #6   Title Pt will improve BERG to at least a 44/56 in order to demo decr fall risk.    Baseline 39/56    Time 8    Period Weeks    Status New                 Plan - 11/01/20 1243    Clinical Impression Statement The pt is not getting a new brace for several months due to actively trying to loose weight, therefore today's skilled session focused on balance training with emphasis on increased vestibular and visual system imput. Increased symptoms reported with relsolution with seated rest breaks. The pt is progressing toward goals and should benefit from continued PT.    Personal Factors and Comorbidities Comorbidity 3+;Time since onset of injury/illness/exacerbation;Fitness;Past/Current Experience    Comorbidities HTN, hx of CVA (2017), ovarian cancer    Examination-Activity Limitations Bathing;Locomotion Level;Stand;Transfers;Caring for Others    Examination-Participation Restrictions Community Activity    Stability/Clinical Decision Making Stable/Uncomplicated    Rehab Potential Good    PT Frequency 2x / week    PT Duration 8 weeks    PT Treatment/Interventions ADLs/Self Care Home Management;Aquatic Therapy;DME Instruction;Gait training;Functional mobility training;Therapeutic activities;Stair training;Therapeutic exercise;Balance training;Neuromuscular re-education;Orthotic Fit/Training;Patient/family education;Passive range of motion;Vestibular    PT Next Visit Plan LTG due 11/07/20    PT Home Exercise Plan Access Code: [email or web address]    Consulted and Agree with Plan of Care Patient           Patient will benefit from skilled therapeutic intervention in order to improve the following deficits and impairments:  Abnormal gait, Decreased activity tolerance, Decreased balance, Decreased coordination,  Decreased endurance, Decreased strength, Difficulty walking, Impaired sensation  Visit Diagnosis: Other abnormalities of gait and mobility  Hemiplegia and hemiparesis following cerebral infarction affecting left dominant side (HCC)  Muscle weakness (generalized)     Problem List Patient Active Problem List   Diagnosis Date Noted  . Neuropathic pain 09/19/2020  . Adjustment disorder with mixed anxiety and depressed mood 09/19/2020  . Facial droop 08/30/2020  . Abnormality of gait 05/09/2020  . Hemiplegia of nondominant side, late effect of cerebrovascular disease ([place]) 03/28/2020  . Pleural effusion 03/01/2020  . Sleep disturbance 02/06/2020  . Spastic hemiplegia of left nondominant side due to nontraumatic intraparenchymal hemorrhage of brain ([place]) 12/26/2019  . Trigger thumb of right hand 11/02/2019  . Obstructive sleep apnea 07/26/2019  . Spastic hemiplegia of left nondominant side as late effect of nontraumatic intraparenchymal hemorrhage of brain ([place]) 04/06/2019  . Spastic hemiparesis affecting dominant side ([place]) 01/04/2018  . Hyperglycemia 12/29/2016  . Anxiety and depression 12/29/2016  . Muscle spasticity   . Obesity (BMI 30.0-34.9) 04/08/2016  . Hyperlipidemia 04/08/2016  . Basal ganglia hemorrhage ([place]) 04/08/2016  . Dysphagia as late effect of cerebrovascular disease   .  Migraine with aura and without status migrainosus, not intractable   . Gait disturbance, post-stroke   . Hemiplegia, post-stroke ([place])   . Aphasia, post-stroke   . Dysarthria, post-stroke   . ICH (intracerebral hemorrhage) (HCC) - R basal ganglia due to hypertensive emergency 04/01/2016  . Upper airway cough syndrome 12/06/2015  . PCP NOTES >>>>>>>>>>>>>>>>>>>>>>>>>>>.. 09/25/2015  . Insomnia 12/28/2012  . Annual physical exam 11/23/2011  . Essential hypertension 10/19/2007    Willow Ora, PTA, [facility] [address] [place], [place]  [address] [phone] 11/02/20, 2:54 PM   Name: Abigail Campos MRN: [id number] Date of Birth: November 24, 1971

## 2020-11-04 ENCOUNTER — Encounter: Payer: Self-pay | Admitting: Physical Therapy

## 2020-11-04 ENCOUNTER — Ambulatory Visit: Payer: No Typology Code available for payment source | Admitting: Physical Therapy

## 2020-11-04 ENCOUNTER — Other Ambulatory Visit: Payer: Self-pay

## 2020-11-04 DIAGNOSIS — I69352 Hemiplegia and hemiparesis following cerebral infarction affecting left dominant side: Secondary | ICD-10-CM | POA: Diagnosis not present

## 2020-11-04 DIAGNOSIS — R2689 Other abnormalities of gait and mobility: Secondary | ICD-10-CM | POA: Diagnosis not present

## 2020-11-04 DIAGNOSIS — M6281 Muscle weakness (generalized): Secondary | ICD-10-CM | POA: Diagnosis not present

## 2020-11-04 DIAGNOSIS — R269 Unspecified abnormalities of gait and mobility: Secondary | ICD-10-CM

## 2020-11-04 NOTE — Therapy (Signed)
[facility] [address] [place], [place], [address] Phone: [phone]   Fax:  [phone]  Physical Therapy Treatment/Re-Cert  Patient Details  Name: Abigail Campos MRN: [id number] Date of Birth: 12-Jun-1971 Referring Provider (PT): Mackie Pai, [place]   Encounter Date: 11/04/2020   PT End of Session - 11/04/20 1647    Visit Number 13    Number of Visits 21    Date for PT Re-Evaluation 27/06/23   per re-cert 76/28/31   Authorization Type Aetna    PT Start Time 1426   5 minutes non billable due to pt using restroom   PT Stop Time 1510    PT Time Calculation (min) 44 min    Equipment Utilized During Treatment Gait belt    Activity Tolerance Patient tolerated treatment well   limited by hungry   Behavior During Therapy [facility] for tasks assessed/performed           Past Medical History:  Diagnosis Date   Anxiety and depression    Essential hypertension 10/19/2007   03-2010: metoprolol changed to bystolic (was not feeling well on it, no specific allergy or reaction)     Hemiplegia ([place])    Hyperlipemia    Hypertension    Migraine    Neuromuscular disorder ([place])    left sided hemiplegia - has brace for l leg and walks with a cane   Ovarian cancer ([place])    Sleep apnea    not wearing c-pap yet   Stroke [facility]) 2017    Past Surgical History:  Procedure Laterality Date   ABDOMINAL HYSTERECTOMY  11-14-07   no oophorectomy per surgical report   CESAREAN SECTION     [id number]   INGUINAL HERNIA REPAIR     2004    There were no vitals filed for this visit.   Subjective Assessment - 11/04/20 1429    Subjective No changes since she was last here.    Pertinent History PMH: HTN, hx of CVA (2017), ovarian cancer)    Limitations Walking;Standing    Patient Stated Goals wants to be able to stand on her left leg, improve the balance.    Currently in Pain? No/denies              [facility] PT  Assessment - 11/04/20 1442      Berg Balance Test   Sit to Stand Able to stand without using hands and stabilize independently    Standing Unsupported Able to stand safely 2 minutes    Sitting with Back Unsupported but Feet Supported on Floor or Stool Able to sit safely and securely 2 minutes    Stand to Sit Sits safely with minimal use of hands    Transfers Able to transfer safely, minor use of hands    Standing Unsupported with Eyes Closed Able to stand 10 seconds with supervision    Standing Unsupported with Feet Together Able to place feet together independently and stand 1 minute safely    From Standing, Reach Forward with Outstretched Arm Can reach confidently >25 cm (10")    From Standing Position, Pick up Object from Floor Able to pick up shoe safely and easily    From Standing Position, Turn to Look Behind Over each Shoulder Looks behind from both sides and weight shifts well    Turn 360 Degrees Needs close supervision or verbal cueing   9.63 to L, 7.12 to R   Standing Unsupported, Alternately Place Feet on Step/Stool Needs assistance  to keep from falling or unable to try    Standing Unsupported, One Foot in Front Able to plae foot ahead of the other independently and hold 30 seconds    Standing on One Leg Tries to lift leg/unable to hold 3 seconds but remains standing independently    Total Score 44    Berg comment: 44/56      Timed Up and Go Test   Normal TUG (seconds) 20.4    TUG Comments with SPC with quad tip                         [facility] Adult PT Treatment/Exercise - 11/04/20 1442      Transfers   Transfers Sit to Stand;Stand to Sit    Sit to Stand 5: Supervision;Without upper extremity assist    Five time sit to stand comments  14 seconds from mat table with LLE staggered behind RLE    Stand to Sit 5: Supervision;With upper extremity assist;To bed;To chair/3-in-1      Ambulation/Gait   Ambulation/Gait Yes    Ambulation/Gait Assistance 5: Supervision      Ambulation/Gait Assistance Details clinic distances throughout session,    Assistive device Straight cane   with rubber quad tip   Gait Pattern Decreased stance time - left;Abducted - left;Wide base of support;Step-through pattern;Decreased step length - right;Decreased hip/knee flexion - left;Decreased weight shift to left    Ambulation Surface Level;Indoor    Gait velocity 20.6 seconds = 1.59 ft/sec      Therapeutic Activites    Therapeutic Activities Other Therapeutic Activities    Other Therapeutic Activities pt reporting hungry during testing today and pt reporting that she has not eaten yet today - provided pt with cheese crackers and pt reporting feeling better afterwards.                  PT Education - 11/04/20 1647    Education Details progress towards goals, adding an additional 2x week for 4 weeks for POC, verbally reviewed pt's seated VOR x1 exercise (cues at home to only perform 3 x 10 reps as pt reports that she has been performing for 10 minutes and has had incr dizziness - cues to not let dizziness get above 4-5/10 and to wait for sx to subside before continuing)    Person(s) Educated Patient    Methods Explanation    Comprehension Verbalized understanding            PT Short Term Goals - 11/04/20 1652      PT SHORT TERM GOAL #1   Title ALL STGS = LTGS             PT Long Term Goals - 11/04/20 1440      PT LONG TERM GOAL #1   Title patient to be independent with final HEP for improved functional mobility. ALL LTGS DUE 11/07/20    Time 8    Period Weeks    Status New      PT LONG TERM GOAL #2   Title Pt will improve gait speed to at least 1.8 ft/sec with LRAD in order to demo decr fall risk.    Baseline 1.15 ft/sec, 1.59 ft/sec on 11/04/20    Time 8    Period Weeks    Status Achieved      PT LONG TERM GOAL #3   Title Pt will decr TUG time to 30 seconds or less with SPC with quad tip  in order to demo decr fall risk.    Baseline 39.06 seconds,  20.44 seconds with SPC with quad tip    Time 8    Period Weeks    Status Achieved      PT LONG TERM GOAL #4   Title Pt will ambulate at least 300' over indoor level and outdoor unlevel surfaces with LRAD with supervision in order to demo improved community mobility.    Time 8    Period Weeks    Status New      PT LONG TERM GOAL #5   Title Pt will perform 5x sit <> stand in 16 seconds or less with no UE support vs. single UE support in order to demo improved BLE strength.    Baseline 18.68 seconds, 14 seconds from mat table with no UE support on 11/04/20    Time 8    Period Weeks    Status Achieved      PT LONG TERM GOAL #6   Title Pt will improve BERG to at least a 44/56 in order to demo decr fall risk.    Baseline 39/56, 44/56 on 11/04/20    Time 8    Period Weeks    Status Achieved            Revised/ongoing LTGs:     PT Long Term Goals - 11/04/20 1659      PT LONG TERM GOAL #1   Title patient to be independent with final HEP for improved functional mobility. ALL LTGS DUE 12/02/20    Time 4    Period Weeks    Status On-going    Target Date 12/02/20      PT LONG TERM GOAL #2   Title Pt will improve gait speed to at least 1.8 ft/sec with LRAD in order to demo decr fall risk.    Baseline 1.15 ft/sec, 1.59 ft/sec on 11/04/20    Time 4    Period Weeks    Status On-going      PT LONG TERM GOAL #3   Title Pt will decr TUG time to 17 seconds or less with SPC with quad tip in order to demo decr fall risk.    Baseline 39.06 seconds, 20.44 seconds with SPC with quad tip    Time 4    Period Weeks    Status On-going      PT LONG TERM GOAL #4   Title Pt will ambulate at least 300' over indoor level and outdoor unlevel surfaces with LRAD with supervision in order to demo improved community mobility.    Time 4    Period Weeks    Status On-going      PT LONG TERM GOAL #5   Title Pt will ambulate at least 115' indoors with [facility] with quad tip with supervision while  scanning environment in order to demo improved vestibular input and improved scanning for community mobility.    Time 4    Period Weeks    Status New                Plan - 11/04/20 1656    Clinical Impression Statement Focus of today's skilled session was assessing pt's LTGs for anticipated re-cert. Pt is currently holding off on getting new custom L AFO due to pt wanting to lose some more weight before being casted. Pt met 2 out of 6 LTGs. Unable to assess last 2 due to time constraints. Pt improved BERG from a 39/56 to 44/56,  5x sit <> stand from 18.68 seconds with single UE support and to 14 seconds with no UE support. Pt performed the TUG today in 20.44 with SPC with quad tip and pt's gait speed at 1.59 ft/sec  putting pt at an incr risk for falls. Will continue with skilled PT for an additional 2x week for 4 weeks to continue to work on LLE>RLE strengthening, gait, balance, vestibular impairments in order to improve functional independence and decr fall risk. LTGs revised/updated as appropriate.    Personal Factors and Comorbidities Comorbidity 3+;Time since onset of injury/illness/exacerbation;Fitness;Past/Current Experience    Comorbidities HTN, hx of CVA (2017), ovarian cancer    Examination-Activity Limitations Bathing;Locomotion Level;Stand;Transfers;Caring for Others    Examination-Participation Restrictions Community Activity    Stability/Clinical Decision Making Stable/Uncomplicated    Rehab Potential Good    PT Frequency 2x / week    PT Duration 4 weeks    PT Treatment/Interventions ADLs/Self Care Home Management;Aquatic Therapy;DME Instruction;Gait training;Functional mobility training;Therapeutic activities;Stair training;Therapeutic exercise;Balance training;Neuromuscular re-education;Orthotic Fit/Training;Patient/family education;Passive range of motion;Vestibular    PT Next Visit Plan review HEP and update as appropriate, LLE strengthening, gait training with head motions,  VOR x1 and progress as appropriate, standing balance (SLS, compliant surfaces)    PT Home Exercise Plan Access Code: [email or web address]    Consulted and Agree with Plan of Care Patient           Patient will benefit from skilled therapeutic intervention in order to improve the following deficits and impairments:  Abnormal gait, Decreased activity tolerance, Decreased balance, Decreased coordination, Decreased endurance, Decreased strength, Difficulty walking, Impaired sensation  Visit Diagnosis: Other abnormalities of gait and mobility  Hemiplegia and hemiparesis following cerebral infarction affecting left dominant side (HCC)  Abnormality of gait  Muscle weakness (generalized)     Problem List Patient Active Problem List   Diagnosis Date Noted   Neuropathic pain 09/19/2020   Adjustment disorder with mixed anxiety and depressed mood 09/19/2020   Facial droop 08/30/2020   Abnormality of gait 05/09/2020   Hemiplegia of nondominant side, late effect of cerebrovascular disease ([place]) 03/28/2020   Pleural effusion 03/01/2020   Sleep disturbance 02/06/2020   Spastic hemiplegia of left nondominant side due to nontraumatic intraparenchymal hemorrhage of brain ([place]) 12/26/2019   Trigger thumb of right hand 11/02/2019   Obstructive sleep apnea 07/26/2019   Spastic hemiplegia of left nondominant side as late effect of nontraumatic intraparenchymal hemorrhage of brain (HCC) 04/06/2019   Spastic hemiparesis affecting dominant side ([place]) 01/04/2018   Hyperglycemia 12/29/2016   Anxiety and depression 12/29/2016   Muscle spasticity    Obesity (BMI 30.0-34.9) 04/08/2016   Hyperlipidemia 04/08/2016   Basal ganglia hemorrhage ([place]) 04/08/2016   Dysphagia as late effect of cerebrovascular disease    Migraine with aura and without status migrainosus, not intractable    Gait disturbance, post-stroke    Hemiplegia, post-stroke (HCC)    Aphasia, post-stroke    Dysarthria,  post-stroke    ICH (intracerebral hemorrhage) ([place]) - R basal ganglia due to hypertensive emergency 04/01/2016   Upper airway cough syndrome 12/06/2015   PCP NOTES >>>>>>>>>>>>>>>>>>>>>>>>>>>.. 09/25/2015   Insomnia 12/28/2012   Annual physical exam 11/23/2011   Essential hypertension 10/19/2007    Arliss Journey, PT, DPT  11/04/2020, 4:57 PM  [facility] [address] [place], [place], [address] Phone: [phone]   Fax:  [phone]  Name: Abigail Campos MRN: [id number] Date of Birth: [birth year]

## 2020-11-05 ENCOUNTER — Ambulatory Visit: Payer: No Typology Code available for payment source | Admitting: Physical Therapy

## 2020-11-12 ENCOUNTER — Other Ambulatory Visit: Payer: Self-pay

## 2020-11-12 ENCOUNTER — Ambulatory Visit: Payer: No Typology Code available for payment source | Admitting: Physical Therapy

## 2020-11-12 ENCOUNTER — Encounter: Payer: Self-pay | Admitting: Physical Therapy

## 2020-11-12 DIAGNOSIS — M6281 Muscle weakness (generalized): Secondary | ICD-10-CM

## 2020-11-12 DIAGNOSIS — I69352 Hemiplegia and hemiparesis following cerebral infarction affecting left dominant side: Secondary | ICD-10-CM

## 2020-11-12 DIAGNOSIS — R2689 Other abnormalities of gait and mobility: Secondary | ICD-10-CM

## 2020-11-12 DIAGNOSIS — R269 Unspecified abnormalities of gait and mobility: Secondary | ICD-10-CM | POA: Diagnosis not present

## 2020-11-12 NOTE — Therapy (Signed)
[facility] [address] [place], [place], [address] Phone: [phone]   Fax:  [phone]  Physical Therapy Treatment  Patient Details  Name: Abigail Campos MRN: [id number] Date of Birth: 04/10/1971 Referring Provider (PT): Mackie Pai, [place]   Encounter Date: 11/12/2020   PT End of Session - 11/12/20 1414    Visit Number 14    Number of Visits 21    Date for PT Re-Evaluation 35/00/93   per re-cert 81/82/99   Authorization Type [employer]    PT Start Time 1231    PT Stop Time 1315    PT Time Calculation (min) 44 min    Equipment Utilized During Treatment Gait belt    Activity Tolerance Patient tolerated treatment well   limited by hungry   Behavior During Therapy [facility] for tasks assessed/performed           Past Medical History:  Diagnosis Date  . Anxiety and depression   . Essential hypertension 10/19/2007   03-2010: metoprolol changed to bystolic (was not feeling well on it, no specific allergy or reaction)    . Hemiplegia ([place])   . Hyperlipemia   . Hypertension   . Migraine   . Neuromuscular disorder ([place])    left sided hemiplegia - has brace for l leg and walks with a cane  . Ovarian cancer ([place])   . Sleep apnea    not wearing c-pap yet  . Stroke [facility]) 2017    Past Surgical History:  Procedure Laterality Date  . ABDOMINAL HYSTERECTOMY  11-14-07   no oophorectomy per surgical report  . CESAREAN SECTION     [id number]  . INGUINAL HERNIA REPAIR     2004    There were no vitals filed for this visit.   Subjective Assessment - 11/12/20 1234    Subjective Had a good Thanksgiving.    Pertinent History PMH: HTN, hx of CVA (2017), ovarian cancer)    Limitations Walking;Standing    Patient Stated Goals wants to be able to stand on her left leg, improve the balance.    Currently in Pain? No/denies                             Access Code: [email or web address] URL:  [email or web address] Date: 11/12/2020 Prepared by: Janann August   Performed thorough review of pt's HEP. See MedBridge for more details.    Exercises Sit to/from Stand in Stride position - 1 x daily - 5 x weekly - 1 sets - 10 reps - needing verbal and demo cues for proper technique as pt initially performing with RLE staggered behind LLE, educated on purpose and rationale of having LLE posteriorly, with pt needing UE support from chair to come to stand, performed 2 x 5 reps, plus additional single reps for additional practice  Seated Knee Extension with Resistance - 1 x daily - 5 x weekly - 1 sets - 10 reps - performed 2 x 10 reps with red tband, needing cues to hold for 3 seconds and perform with full ROM  Seated Hip Adduction Isometrics with Ball - 1 x daily - 5 x weekly - 2 sets - 10 reps - new addition, cues for 3 second holds  Standing Tandem Balance with Unilateral Counter Support - 1 x daily - 5 x weekly - 1 sets - 3 reps - 30 hold - did not have time to review today  Standing Balance in [employer]  with Eyes Closed - 1 x daily - 5 x weekly - 1 sets - 3 reps - 30 hold - performing with a less wide BOS, upgraded for HEP Standing Balance in Corner with Eyes Closed - 1 x daily - 5 x weekly - 1 sets - 10 reps - performing with a less wide BOS, upgraded for HEP  Seated Vestibular Gaze Fixation with Head Rotation - 1 x daily - 5 x weekly - 3 sets - 10 reps - reviewed technique, with slightly increasing speed and for approx. 45 degrees in head turns each direction   Removed seated resisted knee flexion from pt's HEP as pt with incr difficulty performing and unable to perform today without therapist assistance without substitution.          PT Education - 11/12/20 1414    Education Details scheduling more visits, reviewing/updating HEP as appropriate    Person(s) Educated Patient    Methods Explanation;Demonstration;Handout    Comprehension Verbalized  understanding;Returned demonstration;Verbal cues required            PT Short Term Goals - 11/04/20 1652      PT SHORT TERM GOAL #1   Title ALL STGS = LTGS             PT Long Term Goals - 11/04/20 1659      PT LONG TERM GOAL #1   Title patient to be independent with final HEP for improved functional mobility. ALL LTGS DUE 12/02/20    Time 4    Period Weeks    Status On-going    Target Date 12/02/20      PT LONG TERM GOAL #2   Title Pt will improve gait speed to at least 1.8 ft/sec with LRAD in order to demo decr fall risk.    Baseline 1.15 ft/sec, 1.59 ft/sec on 11/04/20    Time 4    Period Weeks    Status On-going      PT LONG TERM GOAL #3   Title Pt will decr TUG time to 17 seconds or less with SPC with quad tip in order to demo decr fall risk.    Baseline 39.06 seconds, 20.44 seconds with SPC with quad tip    Time 4    Period Weeks    Status On-going      PT LONG TERM GOAL #4   Title Pt will ambulate at least 300' over indoor level and outdoor unlevel surfaces with LRAD with supervision in order to demo improved community mobility.    Time 4    Period Weeks    Status On-going      PT LONG TERM GOAL #5   Title Pt will ambulate at least 115' indoors with [facility] with quad tip with supervision while scanning environment in order to demo improved vestibular input and improved scanning for community mobility.    Time 4    Period Weeks    Status New                 Plan - 11/12/20 1416    Clinical Impression Statement Focus of today's skilled session was reviewing pt's HEP - made modifications as necessary. Pt needing cues on how to perform staggered stance sit <> stands at home (as pt was performing with RLE staggered behind LLE). Progressed corner balance to having feet a bit closer together vs. as wide of a BOS. Will continue to progress towards LTGs.    Personal Factors and Comorbidities Comorbidity 3+;Time  since onset of  injury/illness/exacerbation;Fitness;Past/Current Experience    Comorbidities HTN, hx of CVA (2017), ovarian cancer    Examination-Activity Limitations Bathing;Locomotion Level;Stand;Transfers;Caring for Others    Examination-Participation Restrictions Community Activity    Stability/Clinical Decision Making Stable/Uncomplicated    Rehab Potential Good    PT Frequency 2x / week    PT Duration 4 weeks    PT Treatment/Interventions ADLs/Self Care Home Management;Aquatic Therapy;DME Instruction;Gait training;Functional mobility training;Therapeutic activities;Stair training;Therapeutic exercise;Balance training;Neuromuscular re-education;Orthotic Fit/Training;Patient/family education;Passive range of motion;Vestibular    PT Next Visit Plan LLE strengthening, gait training with head motions, VOR x1 and progress as appropriate, standing balance (SLS, compliant surfaces), SciFit/NuStep for strengthening    PT Home Exercise Plan Access Code: [email or web address]    Consulted and Agree with Plan of Care Patient           Patient will benefit from skilled therapeutic intervention in order to improve the following deficits and impairments:  Abnormal gait, Decreased activity tolerance, Decreased balance, Decreased coordination, Decreased endurance, Decreased strength, Difficulty walking, Impaired sensation  Visit Diagnosis: Other abnormalities of gait and mobility  Hemiplegia and hemiparesis following cerebral infarction affecting left dominant side (HCC)  Abnormality of gait  Muscle weakness (generalized)     Problem List Patient Active Problem List   Diagnosis Date Noted  . Neuropathic pain 09/19/2020  . Adjustment disorder with mixed anxiety and depressed mood 09/19/2020  . Facial droop 08/30/2020  . Abnormality of gait 05/09/2020  . Hemiplegia of nondominant side, late effect of cerebrovascular disease ([place]) 03/28/2020  . Pleural effusion 03/01/2020  . Sleep disturbance 02/06/2020  . Spastic  hemiplegia of left nondominant side due to nontraumatic intraparenchymal hemorrhage of brain ([place]) 12/26/2019  . Trigger thumb of right hand 11/02/2019  . Obstructive sleep apnea 07/26/2019  . Spastic hemiplegia of left nondominant side as late effect of nontraumatic intraparenchymal hemorrhage of brain ([place]) 04/06/2019  . Spastic hemiparesis affecting dominant side ([place]) 01/04/2018  . Hyperglycemia 12/29/2016  . Anxiety and depression 12/29/2016  . Muscle spasticity   . Obesity (BMI 30.0-34.9) 04/08/2016  . Hyperlipidemia 04/08/2016  . Basal ganglia hemorrhage ([place]) 04/08/2016  . Dysphagia as late effect of cerebrovascular disease   . Migraine with aura and without status migrainosus, not intractable   . Gait disturbance, post-stroke   . Hemiplegia, post-stroke ([place])   . Aphasia, post-stroke   . Dysarthria, post-stroke   . ICH (intracerebral hemorrhage) (HCC) - R basal ganglia due to hypertensive emergency 04/01/2016  . Upper airway cough syndrome 12/06/2015  . PCP NOTES >>>>>>>>>>>>>>>>>>>>>>>>>>>.. 09/25/2015  . Insomnia 12/28/2012  . Annual physical exam 11/23/2011  . Essential hypertension 10/19/2007    Arliss Journey, PT, DPT  11/12/2020, 2:17 PM  [facility] [address] [place], [place], [address] Phone: [phone]   Fax:  [phone]  Name: Abigail Campos MRN: [id number] Date of Birth: 30-Jun-1971

## 2020-11-12 NOTE — Patient Instructions (Signed)
Access Code: [email or web address] URL: [email or web address] Date: 11/12/2020 Prepared by: Janann August  Exercises Sit to/from Stand in Stride position - 1 x daily - 5 x weekly - 1 sets - 10 reps Seated Knee Extension with Resistance - 1 x daily - 5 x weekly - 1 sets - 10 reps Seated Hip Adduction Isometrics with Ball - 1 x daily - 5 x weekly - 2 sets - 10 reps Standing Tandem Balance with Unilateral Counter Support - 1 x daily - 5 x weekly - 1 sets - 3 reps - 30 hold Standing Balance in Corner with Eyes Closed - 1 x daily - 5 x weekly - 1 sets - 3 reps - 30 hold Standing Balance in Corner with Eyes Closed - 1 x daily - 5 x weekly - 1 sets - 10 reps Seated Vestibular Gaze Fixation with Head Rotation - 1 x daily - 5 x weekly - 3 sets - 10 reps

## 2020-11-17 NOTE — Progress Notes (Signed)
HPI F  never smoker followed for OSA, Insomnia, complicated by Migraine, HBP, CVA 2017 (Basal Ganglia ICH)/ aphasia/ dysphagia/ spastic L hemiparesis, Upper Airway Cough, Depression, GERD HST 08/28/2019- AHI 12.3/ hr, desaturation to 75%, body weight 168 lbs  -------------------------------------------------------------------------------------------------- 03/01/20- 48 yoF never smoker followed for OSA, Insomnia, complicated by Migraine, HBP, CVA 2017 (Basal Ganglia ICH)/ aphasia/ dysphagia/ spastic L hemiplegia, Upper Airway Cough, Depression, GERD -----f/u OSA . Body weight today 151 lbs Oral Appliance- Ron Parker-  MRI abd 12/22/19 showed trace dependent pleural effusions.  Getting dental work so that she can proceed with fitted oral appliance for sleep apnea, per Dr Ron Parker. She is comfortable with this approach.   11/18/20- 49 yoF never smoker followed for OSA, Insomnia, complicated by Migraine, HBP, CVA 2017 (Basal Ganglia ICH)/ aphasia/ dysphagia/ spastic L hemiplegia, Upper Airway Cough, Depression, GERD Oral Appliance-Katz Body weight today- 175 lbs Covid vax- 2 Phizer Flu vax- had Hosp in Sept for falling- never received the oral appliance due to issues.  She says Dr Ron Parker wanted a lot of other dental work done and her insurance wouldn't cover it.  Sleeps ok with Sonata 10 mg with no morning carry-over. Insurance only covers 15/ month. She previously failed trial of Trazodone. We will continue sonata- she can't split capsules, so will use them intermittently. We will consider repeat sleep study next year for possible re-trial of CPAP.  CXR w ribs L 08/30/20- FINDINGS: A radiopaque marker was placed at the site of the patient's pain. No fracture or other bone lesions are seen involving the ribs. There is no evidence of pneumothorax or pleural effusion. Both lungs are clear. Heart size and mediastinal contours are within normal limits. IMPRESSION: Negative.   ROS-see HPI   + =  positive Constitutional:    weight loss, night sweats, fevers, chills, fatigue, lassitude. HEENT:    +headaches, difficulty swallowing, tooth/dental problems, sore throat,       sneezing, itching, ear ache, nasal congestion, post nasal drip, snoring CV:    chest pain, orthopnea, PND, swelling in lower extremities, anasarca,                                  dizziness, palpitations Resp:   shortness of breath with exertion or at rest.                productive cough,   non-productive cough, coughing up of blood.              change in color of mucus.  wheezing.   Skin:    rash or lesions. GI:  No-   heartburn, indigestion, abdominal pain, nausea, vomiting, diarrhea,                 change in bowel habits, loss of appetite GU: dysuria, change in color of urine, no urgency or frequency.   flank pain. MS:   joint pain, stiffness, decreased range of motion, back pain. Neuro-   +L spastic hemiparesis Psych:  change in mood or affect.  depression or anxiety.   memory loss.  OBJ- Physical Exam General- Alert, Oriented, Affect-appropriate, Distress- none acute, +Overweight Skin- rash-none, lesions- none, excoriation- none Lymphadenopathy- none Head- atraumatic            Eyes- Gross vision intact, PERRLA, conjunctivae and secretions clear            Ears- Hearing, canals-normal  Nose- Clear, no-Septal dev, mucus, polyps, erosion, perforation             Throat- Mallampati II-III , mucosa clear , drainage- none, tonsils small, + teeth Neck- flexible , trachea midline, no stridor , thyroid nl, carotid no bruit Chest - symmetrical excursion , unlabored           Heart/CV- RRR , no murmur , no gallop  , no rub, nl s1 s2                           - JVD- none , edema- none, stasis changes- none, varices- none           Lung- clear to P&A, wheeze- none, cough- none , dullness-none, rub- none           Chest wall-  Abd-  Br/ Gen/ Rectal- Not done, not indicated Extrem- +splint L calf, +  cane Neuro- +L spastic hemiplegic.

## 2020-11-18 ENCOUNTER — Ambulatory Visit (INDEPENDENT_AMBULATORY_CARE_PROVIDER_SITE_OTHER): Payer: No Typology Code available for payment source | Admitting: Internal Medicine

## 2020-11-18 ENCOUNTER — Other Ambulatory Visit: Payer: Self-pay

## 2020-11-18 ENCOUNTER — Encounter: Payer: Self-pay | Admitting: Internal Medicine

## 2020-11-18 DIAGNOSIS — G4733 Obstructive sleep apnea (adult) (pediatric): Secondary | ICD-10-CM | POA: Diagnosis not present

## 2020-11-18 DIAGNOSIS — E669 Obesity, unspecified: Secondary | ICD-10-CM

## 2020-11-18 NOTE — Patient Instructions (Addendum)
Try to keep your weight down and try to sleep off the flat of your back, so your sleep apnea hopefully will be less.  When we see you again a year from now we can talk about repeating your sleep study.  If we can help you sooner, please call.

## 2020-11-19 ENCOUNTER — Encounter: Payer: Self-pay | Admitting: Internal Medicine

## 2020-11-19 ENCOUNTER — Ambulatory Visit: Payer: No Typology Code available for payment source | Admitting: Physical Therapy

## 2020-11-19 ENCOUNTER — Ambulatory Visit (INDEPENDENT_AMBULATORY_CARE_PROVIDER_SITE_OTHER): Payer: No Typology Code available for payment source | Admitting: Internal Medicine

## 2020-11-19 VITALS — BP 131/87 | HR 67 | Temp 98.2°F | Ht 59.0 in | Wt 177.0 lb

## 2020-11-19 DIAGNOSIS — Z780 Asymptomatic menopausal state: Secondary | ICD-10-CM | POA: Diagnosis not present

## 2020-11-19 DIAGNOSIS — R739 Hyperglycemia, unspecified: Secondary | ICD-10-CM

## 2020-11-19 DIAGNOSIS — Z0000 Encounter for general adult medical examination without abnormal findings: Secondary | ICD-10-CM

## 2020-11-19 LAB — VITAMIN D 25 HYDROXY (VIT D DEFICIENCY, FRACTURES): VITD: 17.7 ng/mL — ABNORMAL LOW (ref 30.00–100.00)

## 2020-11-19 LAB — HEMOGLOBIN A1C: Hgb A1c MFr Bld: 6 % (ref 4.6–6.5)

## 2020-11-19 NOTE — Progress Notes (Signed)
Subjective:    Patient ID: Abigail Campos, female    DOB: [birth year], 49 y.o.   MRN: [id number]  DOS:  11/19/2020 Type of visit - description: cpx  In general feeling well. PHQ-9 scores 13, this time of the year is difficult because Christmas and also is the anniversary of several deaths in the family.  Denies any suicidal ideas. Also concerned about her weight.   Wt Readings from Last 3 Encounters:  11/19/20 177 lb (80.3 kg)  11/18/20 175 lb 6 oz (79.5 kg)  10/22/20 164 lb (74.4 kg)    Review of Systems  Other than above, a 14 point review of systems is negative     Past Medical History:  Diagnosis Date  . Anxiety and depression   . Essential hypertension 10/19/2007   03-2010: metoprolol changed to bystolic (was not feeling well on it, no specific allergy or reaction)    . Hemiplegia ([place])   . Hyperlipemia   . Hypertension   . Migraine   . Neuromuscular disorder ([place])    left sided hemiplegia - has brace for l leg and walks with a cane  . Ovarian cancer ([place])   . Sleep apnea    not wearing c-pap yet  . Stroke [facility]) 2017    Past Surgical History:  Procedure Laterality Date  . ABDOMINAL HYSTERECTOMY  11-14-07   no oophorectomy per surgical report  . CESAREAN SECTION     [id number]  . INGUINAL HERNIA REPAIR     2004    Allergies as of 11/19/2020      Reactions   Bee Venom Anaphylaxis   Peanut-containing Drug Products Anaphylaxis   Hydrocodone Hives   Generalize itching w/o rash   Isovue [iopamidol] Hives, Itching   Pt broke out with one facial hive.  Had itching on her right upper ant and posterior arm w/o evidence of hives.  Pt given water and 25mg  benadryl po.  We observed pt for 30 minutes before d/c.  J. Bohm     Ambien [zolpidem Tartrate] Other (See Comments)   forgetfulness   Aspirin Swelling   Reports blisters w/ ASA but pt reports is ok w/ Motrin, Advil, naproxen   Latex Hives      Medication List       Accurate as of November 19, 2020 11:59  PM. If you have any questions, ask your nurse or doctor.        atorvastatin 20 MG tablet Commonly known as: LIPITOR Take 1 tablet (20 mg total) by mouth daily at 6 PM.   Baclofen 5 MG Tabs Take 30 mg by mouth. Take TID   baclofen 10 MG tablet Commonly known as: LIORESAL Take 1 tablet (10 mg total) by mouth 3 (three) times daily. Take 3 tablets(30 mg total) three times daily.   buPROPion 100 MG tablet Commonly known as: WELLBUTRIN Take 1 tablet (100 mg total) by mouth 2 (two) times daily.   carvedilol 6.25 MG tablet Commonly known as: COREG Take 1 tablet (6.25 mg total) by mouth 2 (two) times daily with a meal.   clopidogrel 75 MG tablet Commonly known as: PLAVIX Take 1 tablet (75 mg total) by mouth daily.   escitalopram 20 MG tablet Commonly known as: LEXAPRO Take 1 tablet (20 mg total) by mouth daily.   furosemide 20 MG tablet Commonly known as: LASIX Take 1 tablet (20 mg total) by mouth daily.   gabapentin 300 MG capsule Commonly known as: Neurontin Take 1 capsule (300  mg total) by mouth 3 (three) times daily.   lisinopril 2.5 MG tablet Commonly known as: ZESTRIL Take 1 tablet (2.5 mg total) by mouth daily.   VITAMIN B 12 PO Take 1 tablet by mouth daily.   zaleplon 10 MG capsule Commonly known as: SONATA Take 1 capsule (10 mg total) by mouth at bedtime as needed for sleep.          Objective:   Physical Exam BP 131/87 (BP Location: Right Arm, Patient Position: Sitting, Cuff Size: Large)   Pulse 67   Temp 98.2 F (36.8 C) (Oral)   Ht 4\' 11"  (1.499 m)   Wt 177 lb (80.3 kg)   SpO2 93%   BMI 35.75 kg/m  General: Well developed, NAD, BMI noted Neck: No  thyromegaly  HEENT:  Normocephalic . Face symmetric, atraumatic Lungs:  CTA B Normal respiratory effort, no intercostal retractions, no accessory muscle use. Heart: RRR,  no murmur.  Abdomen:  Not distended, soft, non-tender. No rebound or rigidity.   Lower extremities: no pretibial edema  bilaterally  Skin: Exposed areas without rash. Not pale. Not jaundice Neurologic:  alert & oriented X3.  Speech normal, gait at baseline, uses a left leg brace and a cane. Psych: Cognition and judgment appear intact.  Cooperative with normal attention span and concentration.  Behavior appropriate. No anxious or depressed appearing.     Assessment       Assessment   Prediabetes: A1c 6.0 (April 2017)  HTN Hyperlipidemia Depression, insomnia GERD Stroke 03-2016: [place] (L)  spastic hemiplegia   ICH, right basilar ganglia due to HTN emergency, + encephalopathy, + dysphagia, aphasia, dysarthria Residual L spasticity.  CTA neck 03-2016 neg CTA head 10-17 (-) aneurysm Headaches , migraines dx after 2nd pregnancy, (-) CT head 2014 and 2015 Cough, persisting, saw Dr. Melvyn Novas 11-2015 OSA   PLAN: Here for CPX Prediabetes: Check A1c. HTN, cholesterol, depression, insomnia: All well controlled with current medications, she does get occasionally depressed this time of the year but no suicidal thoughts.  Insomnia controlled on Sonata. Plan: No change, check A1c. Obesity: BMI 35, unable to lose weight, reports she is doing all she can with diet. Exercise is limited.  We talk about possibly injectables to help with weight but she is concerned about the cost.  Recommend to reach out to the wellness clinic. Perimenopausal: gyn RX gabapentin, symptoms are still there but not disruptive RTC 6 months     This visit occurred during the SARS-CoV-2 public health emergency.  Safety protocols were in place, including screening questions prior to the visit, additional usage of staff PPE, and extensive cleaning of exam room while observing appropriate contact time as indicated for disinfecting solutions.

## 2020-11-19 NOTE — Assessment & Plan Note (Signed)
Encouraged effort at weight loss, hoping to improve OSA.

## 2020-11-19 NOTE — Patient Instructions (Signed)
Check the  blood pressure regularly.  BP GOAL is between 110/65 and  135/85. If it is consistently higher or lower, let me know    GO TO THE LAB : Get the blood work     GO TO THE FRONT DESK, PLEASE SCHEDULE YOUR APPOINTMENTS Come back for a checkup in 6 months 

## 2020-11-19 NOTE — Assessment & Plan Note (Signed)
Mild OSA on  2017 study. Conservative measures- observation, weight loss, sleep off back, were discussed. Her stroke residual may affect sleep position options and unfortunately she has gained weight..  Plan- at next ov consider repeat HST and possibly re-try CPAP if appropriate.

## 2020-11-20 ENCOUNTER — Encounter: Payer: Self-pay | Admitting: Internal Medicine

## 2020-11-20 NOTE — Assessment & Plan Note (Signed)
-  Tdap 2015 -prevnar 12/2016;  PNM 23 : 2019 - Covid vax  x2, recommend booster by 02/2021. -Had a flu shot - female care:  saw gyn 10/2019; MMG 08-26-2019, has a f/u mmg schedule -CCS: C-scope 10/06/2019, next per GI -Labs reviewed, check a A1c, vitamin D

## 2020-11-20 NOTE — Assessment & Plan Note (Signed)
Here for CPX Prediabetes: Check A1c. HTN, cholesterol, depression, insomnia: All well controlled with current medications, she does get occasionally depressed this time of the year but no suicidal thoughts.  Insomnia controlled on Sonata. Plan: No change, check A1c. Obesity: BMI 35, unable to lose weight, reports she is doing all she can with diet. Exercise is limited.  We talk about possibly injectables to help with weight but she is concerned about the cost.  Recommend to reach out to the wellness clinic. Perimenopausal: gyn RX gabapentin, symptoms are still there but not disruptive RTC 6 months

## 2020-11-21 ENCOUNTER — Encounter: Payer: Self-pay | Admitting: Physical Therapy

## 2020-11-21 ENCOUNTER — Other Ambulatory Visit: Payer: Self-pay

## 2020-11-21 ENCOUNTER — Ambulatory Visit: Payer: No Typology Code available for payment source | Attending: Internal Medicine | Admitting: Physical Therapy

## 2020-11-21 DIAGNOSIS — R2689 Other abnormalities of gait and mobility: Secondary | ICD-10-CM

## 2020-11-21 DIAGNOSIS — R269 Unspecified abnormalities of gait and mobility: Secondary | ICD-10-CM

## 2020-11-21 DIAGNOSIS — R29818 Other symptoms and signs involving the nervous system: Secondary | ICD-10-CM | POA: Insufficient documentation

## 2020-11-21 DIAGNOSIS — M6281 Muscle weakness (generalized): Secondary | ICD-10-CM | POA: Diagnosis not present

## 2020-11-21 DIAGNOSIS — I69352 Hemiplegia and hemiparesis following cerebral infarction affecting left dominant side: Secondary | ICD-10-CM | POA: Insufficient documentation

## 2020-11-21 MED ORDER — VITAMIN D (ERGOCALCIFEROL) 1.25 MG (50000 UNIT) PO CAPS: 50000.0000 [IU] | ORAL_CAPSULE | ORAL | 0 refills | Status: DC

## 2020-11-22 ENCOUNTER — Ambulatory Visit: Payer: No Typology Code available for payment source | Admitting: Physical Therapy

## 2020-11-22 ENCOUNTER — Encounter: Payer: Self-pay | Admitting: Physical Therapy

## 2020-11-22 DIAGNOSIS — R269 Unspecified abnormalities of gait and mobility: Secondary | ICD-10-CM

## 2020-11-22 DIAGNOSIS — I69352 Hemiplegia and hemiparesis following cerebral infarction affecting left dominant side: Secondary | ICD-10-CM

## 2020-11-22 DIAGNOSIS — R2689 Other abnormalities of gait and mobility: Secondary | ICD-10-CM

## 2020-11-22 DIAGNOSIS — M6281 Muscle weakness (generalized): Secondary | ICD-10-CM | POA: Diagnosis not present

## 2020-11-22 DIAGNOSIS — R29818 Other symptoms and signs involving the nervous system: Secondary | ICD-10-CM | POA: Diagnosis not present

## 2020-11-22 NOTE — Therapy (Signed)
[facility] [address] [place], [place], [address] Phone: [phone]   Fax:  [phone]  Physical Therapy Treatment  Patient Details  Name: Abigail Campos MRN: [id number] Date of Birth: 11-Apr-1971 Referring Provider (PT): Mackie Pai, [place]   Encounter Date: 11/21/2020   PT End of Session - 11/21/20 1021    Visit Number 15    Number of Visits 21    Date for PT Re-Evaluation 50/93/26   per re-cert 71/24/58   Authorization Type [employer]    PT Start Time 1019    PT Stop Time 1100    PT Time Calculation (min) 41 min    Equipment Utilized During Treatment Gait belt    Activity Tolerance Patient tolerated treatment well   limited by hungry   Behavior During Therapy [name] for tasks assessed/performed           Past Medical History:  Diagnosis Date  . Anxiety and depression   . Essential hypertension 10/19/2007   03-2010: metoprolol changed to bystolic (was not feeling well on it, no specific allergy or reaction)    . Hemiplegia ([place])   . Hyperlipemia   . Hypertension   . Migraine   . Neuromuscular disorder ([place])    left sided hemiplegia - has brace for l leg and walks with a cane  . Ovarian cancer ([place])   . Sleep apnea    not wearing c-pap yet  . Stroke [facility]) 2017    Past Surgical History:  Procedure Laterality Date  . ABDOMINAL HYSTERECTOMY  11-14-07   no oophorectomy per surgical report  . CESAREAN SECTION     [id number]  . INGUINAL HERNIA REPAIR     2004    There were no vitals filed for this visit.   Subjective Assessment - 11/21/20 1021    Subjective Joined a healthy wellness program with [facility], starts on January 13th. No falls or pain to report.    Pertinent History PMH: HTN, hx of CVA (2017), ovarian cancer)    Limitations Walking;Standing    Patient Stated Goals wants to be able to stand on her left leg, improve the balance.    Currently in Pain? No/denies    Pain Score 0-No pain                [facility] Adult PT Treatment/Exercise - 11/21/20 1022      Transfers   Transfers Sit to Stand;Stand to Sit    Sit to Stand 5: Supervision;Without upper extremity assist    Stand to Sit 5: Supervision;With upper extremity assist;To bed;To chair/3-in-1      Ambulation/Gait   Ambulation/Gait Yes    Ambulation/Gait Assistance 5: Supervision    Ambulation/Gait Assistance Details around gym with session    Assistive device Straight cane   rubber quad tip   Gait Pattern Decreased stance time - left;Abducted - left;Wide base of support;Step-through pattern;Decreased step length - right;Decreased hip/knee flexion - left;Decreased weight shift to left    Ambulation Surface Level;Indoor      Knee/Hip Exercises: Aerobic   Other Aerobic Scifit on level 2.0 for 8 mintues with goal rpm 45-50, assist to keep left LE in alignment               Balance Exercises - 11/21/20 1051      Balance Exercises: Standing   SLS with Vectors Solid surface;Upper extremity assist 1;Limitations    SLS with Vectors Limitations on floor with 2 foam bubbles in front, started  with cane support progressing to no UE support for last few reps both sides.    Rockerboard Anterior/posterior;Lateral;EO;EC;30 seconds;10 reps;Intermittent UE support;Limitations    Rockerboard Limitations performed on the balance board in both directions while holding the board steady with no UE support, occasional touch to sturdy surface: EC 30 sec's x 3 reps, progressing to [facility] for head movements left<>right, up<>down for 10 reps each. min assist for balance with cues on posture and weight shifting to assist with balance.               PT Short Term Goals - 11/04/20 1652      PT SHORT TERM GOAL #1   Title ALL STGS = LTGS             PT Long Term Goals - 11/04/20 1659      PT LONG TERM GOAL #1   Title patient to be independent with final HEP for improved functional mobility. ALL LTGS DUE 12/02/20    Time 4    Period  Weeks    Status On-going    Target Date 12/02/20      PT LONG TERM GOAL #2   Title Pt will improve gait speed to at least 1.8 ft/sec with LRAD in order to demo decr fall risk.    Baseline 1.15 ft/sec, 1.59 ft/sec on 11/04/20    Time 4    Period Weeks    Status On-going      PT LONG TERM GOAL #3   Title Pt will decr TUG time to 17 seconds or less with SPC with quad tip in order to demo decr fall risk.    Baseline 39.06 seconds, 20.44 seconds with SPC with quad tip    Time 4    Period Weeks    Status On-going      PT LONG TERM GOAL #4   Title Pt will ambulate at least 300' over indoor level and outdoor unlevel surfaces with LRAD with supervision in order to demo improved community mobility.    Time 4    Period Weeks    Status On-going      PT LONG TERM GOAL #5   Title Pt will ambulate at least 115' indoors with [facility] with quad tip with supervision while scanning environment in order to demo improved vestibular input and improved scanning for community mobility.    Time 4    Period Weeks    Status New                 Plan - 11/21/20 1022    Clinical Impression Statement Today's skilled session continued to work on strengthening and balance training with no issues noted or reported. The pt is progressing toward goals and should benefit from continued PT to progress toward unmet goals.    Personal Factors and Comorbidities Comorbidity 3+;Time since onset of injury/illness/exacerbation;Fitness;Past/Current Experience    Comorbidities HTN, hx of CVA (2017), ovarian cancer    Examination-Activity Limitations Bathing;Locomotion Level;Stand;Transfers;Caring for Others    Examination-Participation Restrictions Community Activity    Stability/Clinical Decision Making Stable/Uncomplicated    Rehab Potential Good    PT Frequency 2x / week    PT Duration 4 weeks    PT Treatment/Interventions ADLs/Self Care Home Management;Aquatic Therapy;DME Instruction;Gait training;Functional  mobility training;Therapeutic activities;Stair training;Therapeutic exercise;Balance training;Neuromuscular re-education;Orthotic Fit/Training;Patient/family education;Passive range of motion;Vestibular    PT Next Visit Plan LLE strengthening, gait training with head motions, VOR x1 and progress as appropriate, standing balance (SLS, compliant surfaces), SciFit/NuStep  for strengthening    PT Home Exercise Plan Access Code: [email or web address]    Consulted and Agree with Plan of Care Patient           Patient will benefit from skilled therapeutic intervention in order to improve the following deficits and impairments:  Abnormal gait,Decreased activity tolerance,Decreased balance,Decreased coordination,Decreased endurance,Decreased strength,Difficulty walking,Impaired sensation  Visit Diagnosis: Other abnormalities of gait and mobility  Hemiplegia and hemiparesis following cerebral infarction affecting left dominant side (HCC)  Abnormality of gait  Muscle weakness (generalized)     Problem List Patient Active Problem List   Diagnosis Date Noted  . Neuropathic pain 09/19/2020  . Adjustment disorder with mixed anxiety and depressed mood 09/19/2020  . Facial droop 08/30/2020  . Abnormality of gait 05/09/2020  . Hemiplegia of nondominant side, late effect of cerebrovascular disease ([place]) 03/28/2020  . Pleural effusion 03/01/2020  . Sleep disturbance 02/06/2020  . Spastic hemiplegia of left nondominant side due to nontraumatic intraparenchymal hemorrhage of brain ([place]) 12/26/2019  . Trigger thumb of right hand 11/02/2019  . Obstructive sleep apnea 07/26/2019  . Spastic hemiplegia of left nondominant side as late effect of nontraumatic intraparenchymal hemorrhage of brain ([place]) 04/06/2019  . Spastic hemiparesis affecting dominant side ([place]) 01/04/2018  . Hyperglycemia 12/29/2016  . Anxiety and depression 12/29/2016  . Muscle spasticity   . Obesity (BMI 30.0-34.9) 04/08/2016  . Hyperlipidemia  04/08/2016  . Basal ganglia hemorrhage ([place]) 04/08/2016  . Dysphagia as late effect of cerebrovascular disease   . Migraine with aura and without status migrainosus, not intractable   . Gait disturbance, post-stroke   . Hemiplegia, post-stroke ([place])   . Aphasia, post-stroke   . Dysarthria, post-stroke   . ICH (intracerebral hemorrhage) (HCC) - R basal ganglia due to hypertensive emergency 04/01/2016  . Upper airway cough syndrome 12/06/2015  . PCP NOTES >>>>>>>>>>>>>>>>>>>>>>>>>>>.. 09/25/2015  . Insomnia 12/28/2012  . Annual physical exam 11/23/2011  . Essential hypertension 10/19/2007    Willow Ora, PTA, [facility] [address] [place], [place] [address] [phone] 11/22/20, 9:16 AM   Name: Abigail Campos MRN: [id number] Date of Birth: 05-31-1971

## 2020-11-22 NOTE — Therapy (Signed)
[facility] [address] [place], [place], [address] Phone: [phone]   Fax:  [phone]  Physical Therapy Treatment  Patient Details  Name: Abigail Campos MRN: [id number] Date of Birth: Jun 11, 1971 Referring Provider (PT): Mackie Pai, [place]   Encounter Date: 11/22/2020   PT End of Session - 11/22/20 1107    Visit Number 16    Number of Visits 21    Date for PT Re-Evaluation 39/03/00   per re-cert 92/33/00   Authorization Type Aet[employer]    PT Start Time 1103    PT Stop Time 1145    PT Time Calculation (min) 42 min    Equipment Utilized During Treatment Gait belt    Activity Tolerance Patient tolerated treatment well   limited by hungry   Behavior During Therapy [facility] for tasks assessed/performed           Past Medical History:  Diagnosis Date  . Anxiety and depression   . Essential hypertension 10/19/2007   03-2010: metoprolol changed to bystolic (was not feeling well on it, no specific allergy or reaction)    . Hemiplegia ([place])   . Hyperlipemia   . Hypertension   . Migraine   . Neuromuscular disorder ([place])    left sided hemiplegia - has brace for l leg and walks with a cane  . Ovarian cancer ([place])   . Sleep apnea    not wearing c-pap yet  . Stroke [facility]) 2017    Past Surgical History:  Procedure Laterality Date  . ABDOMINAL HYSTERECTOMY  11-14-07   no oophorectomy per surgical report  . CESAREAN SECTION     [id number]  . INGUINAL HERNIA REPAIR     2004    There were no vitals filed for this visit.   Subjective Assessment - 11/22/20 1106    Subjective No new complaints. No changes since yesterday. No pain or falls.    Pertinent History PMH: HTN, hx of CVA (2017), ovarian cancer)    Limitations Walking;Standing    Patient Stated Goals wants to be able to stand on her left leg, improve the balance.    Currently in Pain? No/denies    Pain Score 0-No pain                 [facility] Adult PT  Treatment/Exercise - 11/22/20 1108      Transfers   Transfers Sit to Stand;Stand to Sit    Sit to Stand 5: Supervision;Without upper extremity assist    Stand to Sit 5: Supervision;With upper extremity assist;To bed;To chair/3-in-1      Ambulation/Gait   Ambulation/Gait Yes    Ambulation/Gait Assistance 5: Supervision    Assistive device Straight cane;None   with rubber quad tip; AFO   Gait Pattern Decreased stance time - left;Abducted - left;Wide base of support;Step-through pattern;Decreased step length - right;Decreased hip/knee flexion - left;Decreased weight shift to left    Ambulation Surface Level;Indoor      High Level Balance   High Level Balance Activities Side stepping;Marching forwards;Backward walking    High Level Balance Comments on blue mat in parallel bars with none to light UE support for 3 laps each with cues on form and technique.      Knee/Hip Exercises: Aerobic   Other Aerobic Scifit on level 2.5 for 8 mintues with goal rpm 45-50, assist to keep left LE in alignment               Balance Exercises - 11/22/20  1132      Balance Exercises: Standing   Balance Beam standing across blue foam beam with light single UE support on bar: alternating forward stepping to floor/back onto beam for 10 reps each side. cues for step length/height to clear beam and for weight shifting.    Tandem Gait Forward;Retro;Upper extremity support;Foam/compliant surface;1 rep;Limitations    Tandem Gait Limitations on blue foam beam with UE support, min to mod assist for left LE placement/balance. left LE rotating out due to brace making tandem step placement difficult, retro>forward direction.    Sidestepping Foam/compliant support;Upper extremity support;3 reps;Limitations    Sidestepping Limitations on blue foam beam for 3 laps each direction with cues on form, step placement, weight shifting and posture.               PT Short Term Goals - 11/04/20 1652      PT SHORT TERM  GOAL #1   Title ALL STGS = LTGS             PT Long Term Goals - 11/04/20 1659      PT LONG TERM GOAL #1   Title patient to be independent with final HEP for improved functional mobility. ALL LTGS DUE 12/02/20    Time 4    Period Weeks    Status On-going    Target Date 12/02/20      PT LONG TERM GOAL #2   Title Pt will improve gait speed to at least 1.8 ft/sec with LRAD in order to demo decr fall risk.    Baseline 1.15 ft/sec, 1.59 ft/sec on 11/04/20    Time 4    Period Weeks    Status On-going      PT LONG TERM GOAL #3   Title Pt will decr TUG time to 17 seconds or less with SPC with quad tip in order to demo decr fall risk.    Baseline 39.06 seconds, 20.44 seconds with SPC with quad tip    Time 4    Period Weeks    Status On-going      PT LONG TERM GOAL #4   Title Pt will ambulate at least 300' over indoor level and outdoor unlevel surfaces with LRAD with supervision in order to demo improved community mobility.    Time 4    Period Weeks    Status On-going      PT LONG TERM GOAL #5   Title Pt will ambulate at least 115' indoors with [facility] with quad tip with supervision while scanning environment in order to demo improved vestibular input and improved scanning for community mobility.    Time 4    Period Weeks    Status New                 Plan - 11/22/20 1107    Clinical Impression Statement Today's skilled session continued to focus on strengthening and balance training with no issues noted or reported in session. The pt is making steady progress toward goals and should benefit from continued PT to progress toward unmet goals.    Personal Factors and Comorbidities Comorbidity 3+;Time since onset of injury/illness/exacerbation;Fitness;Past/Current Experience    Comorbidities HTN, hx of CVA (2017), ovarian cancer    Examination-Activity Limitations Bathing;Locomotion Level;Stand;Transfers;Caring for Others    Examination-Participation Restrictions Community  Activity    Stability/Clinical Decision Making Stable/Uncomplicated    Rehab Potential Good    PT Frequency 2x / week    PT Duration 4 weeks  PT Treatment/Interventions ADLs/Self Care Home Management;Aquatic Therapy;DME Instruction;Gait training;Functional mobility training;Therapeutic activities;Stair training;Therapeutic exercise;Balance training;Neuromuscular re-education;Orthotic Fit/Training;Patient/family education;Passive range of motion;Vestibular    PT Next Visit Plan LLE strengthening, gait training with head motions, VOR x1 and progress as appropriate, standing balance (SLS, compliant surfaces), SciFit/NuStep for strengthening    PT Home Exercise Plan Access Code: [email or web address]    Consulted and Agree with Plan of Care Patient           Patient will benefit from skilled therapeutic intervention in order to improve the following deficits and impairments:  Abnormal gait,Decreased activity tolerance,Decreased balance,Decreased coordination,Decreased endurance,Decreased strength,Difficulty walking,Impaired sensation  Visit Diagnosis: Other abnormalities of gait and mobility  Hemiplegia and hemiparesis following cerebral infarction affecting left dominant side (HCC)  Abnormality of gait  Muscle weakness (generalized)     Problem List Patient Active Problem List   Diagnosis Date Noted  . Neuropathic pain 09/19/2020  . Adjustment disorder with mixed anxiety and depressed mood 09/19/2020  . Facial droop 08/30/2020  . Abnormality of gait 05/09/2020  . Hemiplegia of nondominant side, late effect of cerebrovascular disease ([place]) 03/28/2020  . Pleural effusion 03/01/2020  . Sleep disturbance 02/06/2020  . Spastic hemiplegia of left nondominant side due to nontraumatic intraparenchymal hemorrhage of brain ([place]) 12/26/2019  . Trigger thumb of right hand 11/02/2019  . Obstructive sleep apnea 07/26/2019  . Spastic hemiplegia of left nondominant side as late effect of nontraumatic  intraparenchymal hemorrhage of brain ([place]) 04/06/2019  . Spastic hemiparesis affecting dominant side ([place]) 01/04/2018  . Hyperglycemia 12/29/2016  . Anxiety and depression 12/29/2016  . Muscle spasticity   . Obesity (BMI 30.0-34.9) 04/08/2016  . Hyperlipidemia 04/08/2016  . Basal ganglia hemorrhage ([place]) 04/08/2016  . Dysphagia as late effect of cerebrovascular disease   . Migraine with aura and without status migrainosus, not intractable   . Gait disturbance, post-stroke   . Hemiplegia, post-stroke ([place])   . Aphasia, post-stroke   . Dysarthria, post-stroke   . ICH (intracerebral hemorrhage) (HCC) - R basal ganglia due to hypertensive emergency 04/01/2016  . Upper airway cough syndrome 12/06/2015  . PCP NOTES >>>>>>>>>>>>>>>>>>>>>>>>>>>.. 09/25/2015  . Insomnia 12/28/2012  . Annual physical exam 11/23/2011  . Essential hypertension 10/19/2007    Willow Ora, PTA, [facility] [address] [place], [place] [address] [phone] 11/22/20, 5:04 PM   Name: Abigail Campos MRN: [id number] Date of Birth: [birth year]

## 2020-11-26 ENCOUNTER — Ambulatory Visit: Payer: No Typology Code available for payment source | Admitting: Physical Therapy

## 2020-11-26 ENCOUNTER — Encounter: Payer: Self-pay | Admitting: Physical Therapy

## 2020-11-26 ENCOUNTER — Other Ambulatory Visit: Payer: Self-pay

## 2020-11-26 DIAGNOSIS — R29818 Other symptoms and signs involving the nervous system: Secondary | ICD-10-CM | POA: Diagnosis not present

## 2020-11-26 DIAGNOSIS — I69352 Hemiplegia and hemiparesis following cerebral infarction affecting left dominant side: Secondary | ICD-10-CM | POA: Diagnosis not present

## 2020-11-26 DIAGNOSIS — R2689 Other abnormalities of gait and mobility: Secondary | ICD-10-CM

## 2020-11-26 DIAGNOSIS — R269 Unspecified abnormalities of gait and mobility: Secondary | ICD-10-CM | POA: Diagnosis not present

## 2020-11-26 DIAGNOSIS — M6281 Muscle weakness (generalized): Secondary | ICD-10-CM

## 2020-11-26 NOTE — Therapy (Signed)
[facility] [address] [place], [place], [address] Phone: [phone]   Fax:  [phone]  Physical Therapy Treatment  Patient Details  Name: Abigail Campos MRN: [id number] Date of Birth: 04/05/1971 Referring Provider (PT): Mackie Pai, [place]   Encounter Date: 11/26/2020   PT End of Session - 11/26/20 1339    Visit Number 17    Number of Visits 21    Date for PT Re-Evaluation 13/24/40   per re-cert 10/09/24   Authorization Type [employer]et[employer]    PT Start Time 3664    PT Stop Time 1226    PT Time Calculation (min) 41 min    Equipment Utilized During Treatment Gait belt    Activity Tolerance Patient tolerated treatment well    Behavior During Therapy [facility] for tasks assessed/performed           Past Medical History:  Diagnosis Date   Anxiety and depression    Essential hypertension 10/19/2007   03-2010: metoprolol changed to bystolic (was not feeling well on it, no specific allergy or reaction)     Hemiplegia ([place])    Hyperlipemia    Hypertension    Migraine    Neuromuscular disorder ([place])    left sided hemiplegia - has brace for l leg and walks with a cane   Ovarian cancer ([place])    Sleep apnea    not wearing c-pap yet   Stroke [facility]) 2017    Past Surgical History:  Procedure Laterality Date   ABDOMINAL HYSTERECTOMY  11-14-07   no oophorectomy per surgical report   CESAREAN SECTION     4034,7425   INGUINAL HERNIA REPAIR     2004    There were no vitals filed for this visit.   Subjective Assessment - 11/26/20 1150    Subjective After last Friday reports that L knee was popping. Feeling better now. Everything is going well at home.    Pertinent History PMH: HTN, hx of CVA (2017), ovarian cancer)    Limitations Walking;Standing    Patient Stated Goals wants to be able to stand on her left leg, improve the balance.    Currently in Pain? No/denies                              [facility] Adult PT Treatment/Exercise - 11/26/20 1157      Ambulation/Gait   Ambulation/Gait Yes    Ambulation/Gait Assistance 5: Supervision    Ambulation/Gait Assistance Details in and out of session with SPC with quad tip    Gait Pattern Decreased stance time - left;Abducted - left;Wide base of support;Step-through pattern;Decreased step length - right;Decreased hip/knee flexion - left;Decreased weight shift to left    Ambulation Surface Level;Indoor      Knee/Hip Exercises: Aerobic   Other Aerobic SciFit level 2.5 with BLE for 7 minutes for strengthening/activity tolerance, assist from gait belt for LLE in proper alignment, cues for rpm between 40-50               Balance Exercises - 11/26/20 0001      Balance Exercises: Standing   Standing Eyes Opened Foam/compliant surface;Wide (BOA);Head turns    Standing Eyes Opened Limitations with feet hip width distance 2 x 10 reps head nods, 2 x 10 reps head turns, intermittent min guard/touch to bars for balance, cues for weight shifting over to LLE for equal weight bearing    Standing Eyes Closed Narrow  base of support (BOS);Wide (BOA);Head turns;Foam/compliant surface;Other reps (comment);30 secs;Limitations    Standing Eyes Closed Limitations on blue air ex: feet approx. hip width distance 3 x 30 seconds with intermittent touch to // bars for balance and min guard    SLS with Vectors Solid surface;Upper extremity assist 1;Limitations    SLS with Vectors Limitations RLE toe taps to 4" beam: x10 reps with RUE support, x10 reps with R fingertip support, min guard for balance, cues for quad/glute activation on LLE for SLS    Other Standing Exercises on blue mat: forwards and backwards walking with single UE support down and back 3 reps, needing assist for weight shifting at times, pt with incr difficulty with LLE hip extension during retro gait    Other Standing Exercises Comments lateral step overs with  RLE for stance time/SLS on LLE over 2" black foam beam x12 reps with single UE support             PT Education - 11/26/20 1332    Education Details discussed with pt this is week 3 out of 4 in current POC - will check goals at end of next week and plan to wrap up at this time until pt receives her new brace    Person(s) Educated Patient    Methods Explanation    Comprehension Verbalized understanding            PT Short Term Goals - 11/04/20 1652      PT SHORT TERM GOAL #1   Title ALL STGS = LTGS             PT Long Term Goals - 11/04/20 1659      PT LONG TERM GOAL #1   Title patient to be independent with final HEP for improved functional mobility. ALL LTGS DUE 12/02/20    Time 4    Period Weeks    Status On-going    Target Date 12/02/20      PT LONG TERM GOAL #2   Title Pt will improve gait speed to at least 1.8 ft/sec with LRAD in order to demo decr fall risk.    Baseline 1.15 ft/sec, 1.59 ft/sec on 11/04/20    Time 4    Period Weeks    Status On-going      PT LONG TERM GOAL #3   Title Pt will decr TUG time to 17 seconds or less with SPC with quad tip in order to demo decr fall risk.    Baseline 39.06 seconds, 20.44 seconds with SPC with quad tip    Time 4    Period Weeks    Status On-going      PT LONG TERM GOAL #4   Title Pt will ambulate at least 300' over indoor level and outdoor unlevel surfaces with LRAD with supervision in order to demo improved community mobility.    Time 4    Period Weeks    Status On-going      PT LONG TERM GOAL #5   Title Pt will ambulate at least 115' indoors with [facility] with quad tip with supervision while scanning environment in order to demo improved vestibular input and improved scanning for community mobility.    Time 4    Period Weeks    Status New                 Plan - 11/26/20 1340    Clinical Impression Statement Today's skilled session focused on BLE strengthening and balance  training with focus on  compliant surfaces and SLS. Pt reporting no L knee discomfort throughout session today. Intermittent seated/standing rest breaks taken when appropriate. Will continue to progress towards LTGs.    Personal Factors and Comorbidities Comorbidity 3+;Time since onset of injury/illness/exacerbation;Fitness;Past/Current Experience    Comorbidities HTN, hx of CVA (2017), ovarian cancer    Examination-Activity Limitations Bathing;Locomotion Level;Stand;Transfers;Caring for Others    Examination-Participation Restrictions Community Activity    Stability/Clinical Decision Making Stable/Uncomplicated    Rehab Potential Good    PT Frequency 2x / week    PT Duration 4 weeks    PT Treatment/Interventions ADLs/Self Care Home Management;Aquatic Therapy;DME Instruction;Gait training;Functional mobility training;Therapeutic activities;Stair training;Therapeutic exercise;Balance training;Neuromuscular re-education;Orthotic Fit/Training;Patient/family education;Passive range of motion;Vestibular    PT Next Visit Plan LLE strengthening, gait training with head motions,  standing balance (SLS, compliant surfaces), SciFit/NuStep for strengthening    PT Home Exercise Plan Access Code: [email or web address]    Consulted and Agree with Plan of Care Patient           Patient will benefit from skilled therapeutic intervention in order to improve the following deficits and impairments:  Abnormal gait,Decreased activity tolerance,Decreased balance,Decreased coordination,Decreased endurance,Decreased strength,Difficulty walking,Impaired sensation  Visit Diagnosis: Other abnormalities of gait and mobility  Hemiplegia and hemiparesis following cerebral infarction affecting left dominant side (HCC)  Abnormality of gait  Muscle weakness (generalized)     Problem List Patient Active Problem List   Diagnosis Date Noted   Neuropathic pain 09/19/2020   Adjustment disorder with mixed anxiety and depressed mood 09/19/2020   Facial  droop 08/30/2020   Abnormality of gait 05/09/2020   Hemiplegia of nondominant side, late effect of cerebrovascular disease ([place]) 03/28/2020   Pleural effusion 03/01/2020   Sleep disturbance 02/06/2020   Spastic hemiplegia of left nondominant side due to nontraumatic intraparenchymal hemorrhage of brain ([place]) 12/26/2019   Trigger thumb of right hand 11/02/2019   Obstructive sleep apnea 07/26/2019   Spastic hemiplegia of left nondominant side as late effect of nontraumatic intraparenchymal hemorrhage of brain (HCC) 04/06/2019   Spastic hemiparesis affecting dominant side ([place]) 01/04/2018   Hyperglycemia 12/29/2016   Anxiety and depression 12/29/2016   Muscle spasticity    Obesity (BMI 30.0-34.9) 04/08/2016   Hyperlipidemia 04/08/2016   Basal ganglia hemorrhage ([place]) 04/08/2016   Dysphagia as late effect of cerebrovascular disease    Migraine with aura and without status migrainosus, not intractable    Gait disturbance, post-stroke    Hemiplegia, post-stroke (HCC)    Aphasia, post-stroke    Dysarthria, post-stroke    ICH (intracerebral hemorrhage) ([place]) - R basal ganglia due to hypertensive emergency 04/01/2016   Upper airway cough syndrome 12/06/2015   PCP NOTES >>>>>>>>>>>>>>>>>>>>>>>>>>>.. 09/25/2015   Insomnia 12/28/2012   Annual physical exam 11/23/2011   Essential hypertension 10/19/2007    Arliss Journey, PT, DPT  11/26/2020, 1:45 PM  [facility] [address] [place], [place], [address] Phone: [phone]   Fax:  [phone]  Name: Abigail Campos MRN: [id number] Date of Birth: December 27, 1970

## 2020-11-28 ENCOUNTER — Encounter: Payer: Self-pay | Admitting: Physical Therapy

## 2020-11-28 ENCOUNTER — Other Ambulatory Visit: Payer: Self-pay

## 2020-11-28 ENCOUNTER — Ambulatory Visit: Payer: No Typology Code available for payment source | Admitting: Physical Therapy

## 2020-11-28 DIAGNOSIS — M6281 Muscle weakness (generalized): Secondary | ICD-10-CM

## 2020-11-28 DIAGNOSIS — R2689 Other abnormalities of gait and mobility: Secondary | ICD-10-CM

## 2020-11-28 DIAGNOSIS — I69352 Hemiplegia and hemiparesis following cerebral infarction affecting left dominant side: Secondary | ICD-10-CM

## 2020-11-28 DIAGNOSIS — R269 Unspecified abnormalities of gait and mobility: Secondary | ICD-10-CM | POA: Diagnosis not present

## 2020-11-28 DIAGNOSIS — R29818 Other symptoms and signs involving the nervous system: Secondary | ICD-10-CM | POA: Diagnosis not present

## 2020-11-28 NOTE — Therapy (Signed)
[facility] [address] [place], [place], [address] Phone: [phone]   Fax:  [phone]  Physical Therapy Treatment  Patient Details  Name: Abigail Campos MRN: [id number] Date of Birth: February 02, 1971 Referring Provider (PT): Mackie Pai, [place]   Encounter Date: 11/28/2020   PT End of Session - 11/28/20 1407    Visit Number 18    Number of Visits 21    Date for PT Re-Evaluation 94/17/40   per re-cert 81/44/81   Authorization Type Aetna    PT Start Time 1404    PT Stop Time 1444    PT Time Calculation (min) 40 min    Equipment Utilized During Treatment Gait belt    Activity Tolerance Patient tolerated treatment well    Behavior During Therapy [facility] for tasks assessed/performed           Past Medical History:  Diagnosis Date  . Anxiety and depression   . Essential hypertension 10/19/2007   03-2010: metoprolol changed to bystolic (was not feeling well on it, no specific allergy or reaction)    . Hemiplegia ([place])   . Hyperlipemia   . Hypertension   . Migraine   . Neuromuscular disorder ([place])    left sided hemiplegia - has brace for l leg and walks with a cane  . Ovarian cancer ([place])   . Sleep apnea    not wearing c-pap yet  . Stroke [facility]) 2017    Past Surgical History:  Procedure Laterality Date  . ABDOMINAL HYSTERECTOMY  11-14-07   no oophorectomy per surgical report  . CESAREAN SECTION     [id number]  . INGUINAL HERNIA REPAIR     2004    There were no vitals filed for this visit.   Subjective Assessment - 11/28/20 1406    Subjective No new complaints. No falls or pain to report. No more knee popping.    Pertinent History PMH: HTN, hx of CVA (2017), ovarian cancer)    Limitations Walking;Standing    Patient Stated Goals wants to be able to stand on her left leg, improve the balance.    Currently in Pain? No/denies    Pain Score 0-No pain              [facility] Adult PT Treatment/Exercise -  11/28/20 1408      Transfers   Transfers Sit to Stand;Stand to Sit    Sit to Stand 5: Supervision;Without upper extremity assist    Stand to Sit 5: Supervision;With upper extremity assist;To bed;To chair/3-in-1      Ambulation/Gait   Ambulation/Gait Yes    Ambulation/Gait Assistance 5: Supervision    Ambulation/Gait Assistance Details around gym with session    Assistive device Straight cane    Gait Pattern Decreased stance time - left;Abducted - left;Wide base of support;Step-through pattern;Decreased step length - right;Decreased hip/knee flexion - left;Decreased weight shift to left    Ambulation Surface Level;Indoor      High Level Balance   High Level Balance Activities Negotiating over obstacles    High Level Balance Comments 2 black bolsters on blue mat next to counter top: forward stepping over them with cane support, cues on sequencing, step lenght and weight shifting.      Exercises   Exercises Other Exercises    Other Exercises  in parallel bars with right UE support: with 4 inch step- left forward step ups with contralateral march, then left lateral step ups for 10 reps with lifting contralateral leg  up into air/back down.      Knee/Hip Exercises: Aerobic   Other Aerobic SciFit level 3.0 with BLE for 6 minutes for strengthening/activity tolerance, assist from gait belt for LLE in proper alignment, cues for rpm between 40-50               Balance Exercises - 11/28/20 1415      Balance Exercises: Standing   Standing Eyes Opened Foam/compliant surface    Standing Eyes Opened Limitations on airex with feet hip width apart for EO head movements left<>right, up<>down for ~10 reps each with min assist for balance, cues for posture and weight shifting toward left side.    Standing Eyes Closed Foam/compliant surface    Standing Eyes Closed Limitations on airex with feet hip width apart for EC 30 seconds for 3 reps. up to min assist for balance with cues on posture and weight  shifting.               PT Short Term Goals - 11/04/20 1652      PT SHORT TERM GOAL #1   Title ALL STGS = LTGS             PT Long Term Goals - 11/04/20 1659      PT LONG TERM GOAL #1   Title patient to be independent with final HEP for improved functional mobility. ALL LTGS DUE 12/02/20    Time 4    Period Weeks    Status On-going    Target Date 12/02/20      PT LONG TERM GOAL #2   Title Pt will improve gait speed to at least 1.8 ft/sec with LRAD in order to demo decr fall risk.    Baseline 1.15 ft/sec, 1.59 ft/sec on 11/04/20    Time 4    Period Weeks    Status On-going      PT LONG TERM GOAL #3   Title Pt will decr TUG time to 17 seconds or less with SPC with quad tip in order to demo decr fall risk.    Baseline 39.06 seconds, 20.44 seconds with SPC with quad tip    Time 4    Period Weeks    Status On-going      PT LONG TERM GOAL #4   Title Pt will ambulate at least 300' over indoor level and outdoor unlevel surfaces with LRAD with supervision in order to demo improved community mobility.    Time 4    Period Weeks    Status On-going      PT LONG TERM GOAL #5   Title Pt will ambulate at least 115' indoors with [facility] with quad tip with supervision while scanning environment in order to demo improved vestibular input and improved scanning for community mobility.    Time 4    Period Weeks    Status New                 Plan - 11/28/20 1408    Clinical Impression Statement Today's skilled session continued to focus on strengthening and balance training on compliant surfaces with rest breaks taken as needed. No other issues noted or reported in session. The pt is progressing toward goals and should benefit from continued PT to progress toward unmet goals.    Personal Factors and Comorbidities Comorbidity 3+;Time since onset of injury/illness/exacerbation;Fitness;Past/Current Experience    Comorbidities HTN, hx of CVA (2017), ovarian cancer     Examination-Activity Limitations Bathing;Locomotion Level;Stand;Transfers;Caring for Others  Examination-Participation Restrictions Community Activity    Stability/Clinical Decision Making Stable/Uncomplicated    Rehab Potential Good    PT Frequency 2x / week    PT Duration 4 weeks    PT Treatment/Interventions ADLs/Self Care Home Management;Aquatic Therapy;DME Instruction;Gait training;Functional mobility training;Therapeutic activities;Stair training;Therapeutic exercise;Balance training;Neuromuscular re-education;Orthotic Fit/Training;Patient/family education;Passive range of motion;Vestibular    PT Next Visit Plan LLE strengthening, gait training with head motions,  standing balance (SLS, compliant surfaces), SciFit/NuStep for strengthening    PT Home Exercise Plan Access Code: [email or web address]    Consulted and Agree with Plan of Care Patient           Patient will benefit from skilled therapeutic intervention in order to improve the following deficits and impairments:  Abnormal gait,Decreased activity tolerance,Decreased balance,Decreased coordination,Decreased endurance,Decreased strength,Difficulty walking,Impaired sensation  Visit Diagnosis: Other abnormalities of gait and mobility  Hemiplegia and hemiparesis following cerebral infarction affecting left dominant side (HCC)  Abnormality of gait  Muscle weakness (generalized)     Problem List Patient Active Problem List   Diagnosis Date Noted  . Neuropathic pain 09/19/2020  . Adjustment disorder with mixed anxiety and depressed mood 09/19/2020  . Facial droop 08/30/2020  . Abnormality of gait 05/09/2020  . Hemiplegia of nondominant side, late effect of cerebrovascular disease ([place]) 03/28/2020  . Pleural effusion 03/01/2020  . Sleep disturbance 02/06/2020  . Spastic hemiplegia of left nondominant side due to nontraumatic intraparenchymal hemorrhage of brain ([place]) 12/26/2019  . Trigger thumb of right hand 11/02/2019  .  Obstructive sleep apnea 07/26/2019  . Spastic hemiplegia of left nondominant side as late effect of nontraumatic intraparenchymal hemorrhage of brain ([place]) 04/06/2019  . Spastic hemiparesis affecting dominant side ([place]) 01/04/2018  . Hyperglycemia 12/29/2016  . Anxiety and depression 12/29/2016  . Muscle spasticity   . Obesity (BMI 30.0-34.9) 04/08/2016  . Hyperlipidemia 04/08/2016  . Basal ganglia hemorrhage ([place]) 04/08/2016  . Dysphagia as late effect of cerebrovascular disease   . Migraine with aura and without status migrainosus, not intractable   . Gait disturbance, post-stroke   . Hemiplegia, post-stroke ([place])   . Aphasia, post-stroke   . Dysarthria, post-stroke   . ICH (intracerebral hemorrhage) (HCC) - R basal ganglia due to hypertensive emergency 04/01/2016  . Upper airway cough syndrome 12/06/2015  . PCP NOTES >>>>>>>>>>>>>>>>>>>>>>>>>>>.. 09/25/2015  . Insomnia 12/28/2012  . Annual physical exam 11/23/2011  . Essential hypertension 10/19/2007    Willow Ora, PTA, [facility] [address] [place], [place] [address] [phone] 11/28/20, 5:22 PM  Name: Abigail Campos MRN: [id number] Date of Birth: [birth year]

## 2020-12-03 ENCOUNTER — Encounter: Payer: Self-pay | Admitting: Physical Therapy

## 2020-12-03 ENCOUNTER — Other Ambulatory Visit: Payer: Self-pay

## 2020-12-03 ENCOUNTER — Ambulatory Visit: Payer: No Typology Code available for payment source | Admitting: Physical Therapy

## 2020-12-03 DIAGNOSIS — M6281 Muscle weakness (generalized): Secondary | ICD-10-CM | POA: Diagnosis not present

## 2020-12-03 DIAGNOSIS — R2689 Other abnormalities of gait and mobility: Secondary | ICD-10-CM | POA: Diagnosis not present

## 2020-12-03 DIAGNOSIS — R269 Unspecified abnormalities of gait and mobility: Secondary | ICD-10-CM

## 2020-12-03 DIAGNOSIS — R29818 Other symptoms and signs involving the nervous system: Secondary | ICD-10-CM

## 2020-12-03 DIAGNOSIS — I69352 Hemiplegia and hemiparesis following cerebral infarction affecting left dominant side: Secondary | ICD-10-CM | POA: Diagnosis not present

## 2020-12-03 NOTE — Therapy (Signed)
[facility] °[address] °[place], [place], [address] °Phone: [phone]   Fax:  [phone] ° °Physical Therapy Treatment ° °Patient Details  °Name: Abigail Campos °MRN: [id number] °Date of Birth: 09/16/1971 °Referring Provider (PT): Saguier, Edward, PA-C ° ° °Encounter Date: 12/03/2020 ° ° PT End of Session - 12/03/20 1438   ° Visit Number 19   ° Number of Visits 21   ° Date for PT Re-Evaluation 12/11/20   per re-cert 11/04/20  ° Authorization Type [employer] Medicare   ° PT Start Time 1015   ° PT Stop Time 1058   3 minutes non billable due to pt using restroom  ° PT Time Calculation (min) 43 min   ° Equipment Utilized During Treatment Gait belt   ° Activity Tolerance Patient tolerated treatment well   ° Behavior During Therapy WFL for tasks assessed/performed   °  °  °  ° ° °Past Medical History:  °Diagnosis Date  °• Anxiety and depression   °• Essential hypertension 10/19/2007  ° 03-2010: metoprolol changed to bystolic (was not feeling well on it, no specific allergy or reaction)    °• Hemiplegia (HCC)   °• Hyperlipemia   °• Hypertension   °• Migraine   °• Neuromuscular disorder (HCC)   ° left sided hemiplegia - has brace for l leg and walks with a cane  °• Ovarian cancer (HCC)   °• Sleep apnea   ° not wearing c-pap yet  °• Stroke (HCC) 2017  ° ° °Past Surgical History:  °Procedure Laterality Date  °• ABDOMINAL HYSTERECTOMY  11-14-07  ° no oophorectomy per surgical report  °• CESAREAN SECTION    ° 1998,2007  °• INGUINAL HERNIA REPAIR    ° 2004  ° ° °There were no vitals filed for this visit. ° ° Subjective Assessment - 12/03/20 1022   ° Subjective Knee is no longer popping, doing well. Starting the health and wellness program at [facility] in January - including diet, exercise.   ° Pertinent History PMH: HTN, hx of CVA (2017), ovarian cancer)   ° Limitations Walking;Standing   ° Patient Stated Goals wants to be able to stand on her left leg, improve the  balance.   ° Currently in Pain? No/denies   °  °  °  ° ° ° ° ° [facility] PT Assessment - 12/03/20 1032   °  ° Ambulation/Gait  ° Ambulation/Gait Yes   ° Gait velocity 19.6 seconds = 1.67 ft/sec   °  ° Timed Up and Go Test  ° Normal TUG (seconds) 21   ° TUG Comments with SPC with quad tip   °  °  °  ° ° ° ° ° ° ° ° ° ° ° ° ° ° ° ° [facility] Adult PT Treatment/Exercise - 12/03/20 1032   °  ° Transfers  ° Transfers Sit to Stand;Stand to Sit   ° Sit to Stand 5: Supervision;With upper extremity assist   ° Stand to Sit 5: Supervision;With upper extremity assist;Without upper extremity assist   ° Comments 2 x 5 reps with reviewing from HEP - pt needing cues for having LLE staggered behind RLE as pt initially performs with RLE staggered posteriorly, pt needing to use UE support to stand   °  ° Ambulation/Gait  ° Ambulation/Gait Assistance 5: Supervision   ° Ambulation/Gait Assistance Details performed gait with scanning environment and head motions with supervision with pt having no LOB and no dizziness, able   to walk while continuously performing head turns, plus additional distances between activities    Ambulation Distance (Feet) 230 Feet    Assistive device Straight cane   with quad tip   Gait Pattern Decreased stance time - left;Abducted - left;Wide base of support;Step-through pattern;Decreased step length - right;Decreased hip/knee flexion - left;Decreased weight shift to left    Ambulation Surface Level;Indoor      Therapeutic Activites    Therapeutic Activities Other Therapeutic Activities    Other Therapeutic Activities discussed with pt this is week 4 in POC and will wrap up at end of this week, pt to work on her own at home until pt gets her new AFO (waiting to lose more weight before getting casted) and then will need a new referral to come back to therapy, pt in agreement with plan               Balance Exercises - 12/03/20 0001      Balance Exercises: Standing   Standing Eyes Closed Foam/compliant  surface    Standing Eyes Closed Limitations wide BOS eyes closed 3  x 30 seconds, head turns x5 reps and x10 reps, head nods x5 reps and x10 reps with min guard/min A for balance and intermittent taps to wall/chair for balance    SLS Eyes open;Solid surface;Upper extremity support 1    SLS Limitations RUE support 2 x 10 reps of tapping RLE to 4" block for incr SLS time through LLE, cues for slowed and controlled             PT Education - 12/03/20 1438    Education Details progress towards goals, will wrap up at next visit and D/C, pt to get a new referral to return to PT when pt gets new AFO    Person(s) Educated Patient    Methods Explanation    Comprehension Verbalized understanding            PT Short Term Goals - 11/04/20 1652      PT SHORT TERM GOAL #1   Title ALL STGS = LTGS              PT Long Term Goals - 12/03/20 1027      PT LONG TERM GOAL #1   Title patient to be independent with final HEP for improved functional mobility. ALL LTGS DUE 12/02/20    Time 4    Period Weeks    Status On-going      PT LONG TERM GOAL #2   Title Pt will improve gait speed to at least 1.8 ft/sec with LRAD in order to demo decr fall risk.    Baseline 1.15 ft/sec, 1.59 ft/sec on 11/04/20, 19.6 seconds = 1.67 ft/sec on 12/03/20    Time 4    Period Weeks    Status Not Met      PT LONG TERM GOAL #3   Title Pt will decr TUG time to 17 seconds or less with SPC with quad tip in order to demo decr fall risk.    Baseline 39.06 seconds, 20.44 seconds with SPC with quad tip, 21 seconds    Time 4    Period Weeks    Status Not Met      PT LONG TERM GOAL #4   Title Pt will ambulate at least 300' over indoor level and outdoor unlevel surfaces with LRAD with supervision in order to demo improved community mobility.    Time 4    Period Weeks  Status On-going   °  ° PT LONG TERM GOAL #5  ° Title Pt will ambulate at least 115' indoors with SPC with quad tip with supervision while scanning  environment in order to demo improved vestibular input and improved scanning for community mobility.   ° Baseline 230' with supervision on 12/03/20   ° Time 4   ° Period Weeks   ° Status Achieved   °  °  °  ° ° ° ° ° ° ° ° Plan - 12/03/20 1551   ° Clinical Impression Statement Focus of today's skilled session was checking pt's LTGs for anticipated D/C at next session. Pt did not meet LTG #2 and #3. Pt's gait speed improved to 1.67 ft/sec (previously 1.59 ft/sec), but not quite to goal level, still putting pt at an increased risk for falls. From last assessed, pt's TUG time with SPC with quad tip stayed approx the same at 21 seconds. Pt met LTG #5 - able to ambulate 230' today with cane and scanning environment with minimal change in gait speed and no LOB/dizziness. Pt in agreement to D/C at next session and pt to get a new referral to return to therapy after receiving new AFO (waiting to lose a little more weight before being casted for custom brace). Will assess remaining LTGs at next session.   ° Personal Factors and Comorbidities Comorbidity 3+;Time since onset of injury/illness/exacerbation;Fitness;Past/Current Experience   ° Comorbidities HTN, hx of CVA (2017), ovarian cancer   ° Examination-Activity Limitations Bathing;Locomotion Level;Stand;Transfers;Caring for Others   ° Examination-Participation Restrictions Community Activity   ° Stability/Clinical Decision Making Stable/Uncomplicated   ° Rehab Potential Good   ° PT Frequency 2x / week   ° PT Duration 4 weeks   ° PT Treatment/Interventions ADLs/Self Care Home Management;Aquatic Therapy;DME Instruction;Gait training;Functional mobility training;Therapeutic activities;Stair training;Therapeutic exercise;Balance training;Neuromuscular re-education;Orthotic Fit/Training;Patient/family education;Passive range of motion;Vestibular   ° PT Next Visit Plan check remaining LTGs (review HEP). D/C, cancel next weeks appts.   ° PT Home Exercise Plan Access Code:  [email or web address]   ° Consulted and Agree with Plan of Care Patient   °  °  °  ° ° °Patient will benefit from skilled therapeutic intervention in order to improve the following deficits and impairments:  Abnormal gait,Decreased activity tolerance,Decreased balance,Decreased coordination,Decreased endurance,Decreased strength,Difficulty walking,Impaired sensation ° °Visit Diagnosis: °Other abnormalities of gait and mobility ° °Muscle weakness (generalized) ° °Other symptoms and signs involving the nervous system ° °Abnormality of gait ° ° ° ° °Problem List °Patient Active Problem List  ° Diagnosis Date Noted  °• Neuropathic pain 09/19/2020  °• Adjustment disorder with mixed anxiety and depressed mood 09/19/2020  °• Facial droop 08/30/2020  °• Abnormality of gait 05/09/2020  °• Hemiplegia of nondominant side, late effect of cerebrovascular disease (HCC) 03/28/2020  °• Pleural effusion 03/01/2020  °• Sleep disturbance 02/06/2020  °• Spastic hemiplegia of left nondominant side due to nontraumatic intraparenchymal hemorrhage of brain (HCC) 12/26/2019  °• Trigger thumb of right hand 11/02/2019  °• Obstructive sleep apnea 07/26/2019  °• Spastic hemiplegia of left nondominant side as late effect of nontraumatic intraparenchymal hemorrhage of brain (HCC) 04/06/2019  °• Spastic hemiparesis affecting dominant side (HCC) 01/04/2018  °• Hyperglycemia 12/29/2016  °• Anxiety and depression 12/29/2016  °• Muscle spasticity   °• Obesity (BMI 30.0-34.9) 04/08/2016  °• Hyperlipidemia 04/08/2016  °• Basal ganglia hemorrhage (HCC) 04/08/2016  °• Dysphagia as late effect of cerebrovascular disease   °• Migraine with aura and without status migrainosus, not   intractable   °• Gait disturbance, post-stroke   °• Hemiplegia, post-stroke (HCC)   °• Aphasia, post-stroke   °• Dysarthria, post-stroke   °• ICH (intracerebral hemorrhage) (HCC) - R basal ganglia due to hypertensive emergency 04/01/2016  °• Upper airway cough syndrome 12/06/2015  °• PCP NOTES  >>>>>>>>>>>>>>>>>>>>>>>>>>>.. 09/25/2015  °• Insomnia 12/28/2012  °• Annual physical exam 11/23/2011  °• Essential hypertension 10/19/2007  ° ° ° N , PT, DPT  °12/03/2020, 3:52 PM ° °[facility] °[facility] °[address] °[place], [place], [address] °Phone: [phone]   Fax:  [phone] ° °Name: Abigail Campos °MRN: [id number] °Date of Birth: 06/21/1971 ° ° °

## 2020-12-05 ENCOUNTER — Other Ambulatory Visit: Payer: Self-pay

## 2020-12-05 ENCOUNTER — Ambulatory Visit: Payer: No Typology Code available for payment source | Admitting: Physical Therapy

## 2020-12-05 ENCOUNTER — Encounter: Payer: Self-pay | Admitting: Physical Therapy

## 2020-12-05 DIAGNOSIS — R29818 Other symptoms and signs involving the nervous system: Secondary | ICD-10-CM | POA: Diagnosis not present

## 2020-12-05 DIAGNOSIS — M6281 Muscle weakness (generalized): Secondary | ICD-10-CM | POA: Diagnosis not present

## 2020-12-05 DIAGNOSIS — R2689 Other abnormalities of gait and mobility: Secondary | ICD-10-CM

## 2020-12-05 DIAGNOSIS — R269 Unspecified abnormalities of gait and mobility: Secondary | ICD-10-CM | POA: Diagnosis not present

## 2020-12-05 DIAGNOSIS — I69352 Hemiplegia and hemiparesis following cerebral infarction affecting left dominant side: Secondary | ICD-10-CM | POA: Diagnosis not present

## 2020-12-05 NOTE — Patient Instructions (Signed)
Access Code: [email or web address] URL: [email or web address] Date: 12/05/2020 Prepared by: Willow Ora  Exercises Sit to/from Stand in Stride position - 1 x daily - 5 x weekly - 1 sets - 10 reps Seated Knee Extension with Resistance - 1 x daily - 5 x weekly - 1 sets - 10 reps Seated Hip Adduction Isometrics with Ball - 1 x daily - 5 x weekly - 2 sets - 10 reps Standing Tandem Balance with Unilateral Counter Support - 1 x daily - 5 x weekly - 1 sets - 3 reps - 30 hold Standing Balance in Corner with Eyes Closed - 1 x daily - 5 x weekly - 1 sets - 10 reps Wide Stance with Eyes Closed on Foam Pad - 1 x daily - 5 x weekly - 1 sets - 3 reps - 30 hold Seated Vestibular Gaze Fixation with Head Rotation - 1 x daily - 5 x weekly - 3 sets - 10 reps

## 2020-12-05 NOTE — Therapy (Addendum)
[facility] [address] [place], [place], [address] Phone: [phone]   Fax:  [phone]  Physical Therapy Treatment/Discharge Summary/10th Visit Progress Note  Patient Details  Name: Abigail Campos MRN: [id number] Date of Birth: 05/09/1971 Referring Provider (PT): Marisue Brooklyn  10th Visit Physical Therapy Progress Note  Dates of Reporting Period: 10/29/20 to 12/05/20  LTGs assessed on 12/03/20: Pt did not meet LTG #2 and #3. Pt's gait speed improved to 1.67 ft/sec (previously 1.59 ft/sec), but not quite to goal level, still putting pt at an increased risk for falls. From last assessed, pt's TUG time with SPC with quad tip stayed approx the same at 21 seconds. Pt met LTG #5 - able to ambulate 230' today with cane and scanning environment with minimal change in gait speed and no LOB/dizziness. Pt met LTG #1 today in regards to being independent with HEP. Pt will be discharged from PT at this time. Pt to continue with exercises at home and to return to PT when she gets her new AFO.   Encounter Date: 12/05/2020   PT End of Session - 12/05/20 1106    Visit Number 20    Number of Visits 21    Date for PT Re-Evaluation 12/11/20   per re-cert 34/74/25   Authorization Type A[employer]na Medicare    PT Start Time 1104    PT Stop Time 1134   discharge visit, not all time was needed   PT Time Calculation (min) 30 min    Equipment Utilized During Treatment Gait belt    Activity Tolerance Patient tolerated treatment well    Behavior During Therapy WFL for tasks assessed/performed           Past Medical History:  Diagnosis Date  . Anxiety and depression   . Essential hypertension 10/19/2007   03-2010: metoprolol changed to bystolic (was not feeling well on it, no specific allergy or reaction)    . Hemiplegia (HCC)   . Hyperlipemia   . Hypertension   . Migraine   . Neuromuscular disorder (HCC)    left sided hemiplegia  - has brace for l leg and walks with a cane  . Ovarian cancer (HCC)   . Sleep apnea    not wearing c-pap yet  . Stroke [facility]) 2017    Past Surgical History:  Procedure Laterality Date  . ABDOMINAL HYSTERECTOMY  11-14-07   no oophorectomy per surgical report  . CESAREAN SECTION     [id number]  . INGUINAL HERNIA REPAIR     2004    There were no vitals filed for this visit.   Subjective Assessment - 12/05/20 1105    Subjective Reports being tired today as she did not sleep well last night. No falls or pain to report. Plans to call and set up appt with [facility] for new brace soon as she does not currently have one set up.    Pertinent History PMH: HTN, hx of CVA (2017), ovarian cancer)    Limitations Walking;Standing    Patient Stated Goals wants to be able to stand on her left leg, improve the balance.    Currently in Pain? No/denies    Pain Score 0-No pain                 [facility] Adult PT Treatment/Exercise - 12/05/20 1118      Transfers   Transfers Stand to Sit;Sit to Stand    Sit to Stand 6: Modified independent (Device/Increase time)  Stand to Sit 6: Modified independent (Device/Increase time)      Ambulation/Gait   Ambulation/Gait Yes    Ambulation/Gait Assistance 5: Supervision    Ambulation/Gait Assistance Details had pt walking while scanning envirorment looking various holiday attire/decorations, including right/left turns and narrowed spaces with no balance loss or dizziness reported. Gait indoors only due to "too cold" per patient to go outside.    Ambulation Distance (Feet) 350 Feet   x1, plus around gym with session   Assistive device Straight cane    Gait Pattern Decreased stance time - left;Abducted - left;Wide base of support;Step-through pattern;Decreased step length - right;Decreased hip/knee flexion - left;Decreased weight shift to left    Ambulation Surface Level;Indoor      Exercises   Exercises Other Exercises    Other Exercises  reviewed pt's  current HEP with advancements made as able. No issues with performance in session. Min guard assist for safety. Refer to [employer] for full details.          Issued the following to HEP today: Access Code: [email or web address] URL: [email or web address] Date: 12/05/2020 Prepared by: Sallyanne Kuster  Exercises Sit to/from Stand in Stride position - 1 x daily - 5 x weekly - 1 sets - 10 reps Seated Knee Extension with Resistance - 1 x daily - 5 x weekly - 1 sets - 10 reps Seated Hip Adduction Isometrics with Ball - 1 x daily - 5 x weekly - 2 sets - 10 reps Standing Tandem Balance with Unilateral Counter Support - 1 x daily - 5 x weekly - 1 sets - 3 reps - 30 hold Standing Balance in Corner with Eyes Closed - 1 x daily - 5 x weekly - 1 sets - 10 reps Wide Stance with Eyes Closed on Foam Pad - 1 x daily - 5 x weekly - 1 sets - 3 reps - 30 hold Seated Vestibular Gaze Fixation with Head Rotation - 1 x daily - 5 x weekly - 3 sets - 10 reps       PT Education - 12/05/20 1144    Education Details progress toward remaining goals, updated HEP.    Person(s) Educated Patient    Methods Explanation;Demonstration;Verbal cues;Handout    Comprehension Verbalized understanding;Returned demonstration            PT Short Term Goals - 11/04/20 1652      PT SHORT TERM GOAL #1   Title ALL STGS = LTGS             PT Long Term Goals - 12/05/20 1117      PT LONG TERM GOAL #1   Title patient to be independent with final HEP for improved functional mobility. ALL LTGS DUE 12/02/20    Status Achieved      PT LONG TERM GOAL #2   Title Pt will improve gait speed to at least 1.8 ft/sec with LRAD in order to demo decr fall risk.    Baseline 1.15 ft/sec, 1.59 ft/sec on 11/04/20, 19.6 seconds = 1.67 ft/sec on 12/03/20    Status Not Met      PT LONG TERM GOAL #3   Title Pt will decr TUG time to 17 seconds or less with SPC with quad tip in order to demo decr fall risk.    Baseline 39.06 seconds, 20.44  seconds with SPC with quad tip, 21 seconds    Status Not Met      PT LONG TERM GOAL #4   Title Pt will ambulate  at least 300' over indoor level and outdoor unlevel surfaces with LRAD with supervision in order to demo improved community mobility.    Baseline 12/05/20: met distance indoors only as it was "too cold" outside per pt. The pt does walk with cane/brace in commnunity outside of PT.    Time --    Period --    Status Achieved      PT LONG TERM GOAL #5   Title Pt will ambulate at least 115' indoors with [facility] with quad tip with supervision while scanning environment in order to demo improved vestibular input and improved scanning for community mobility.    Baseline 230' with supervision on 12/03/20    Status Achieved            PHYSICAL THERAPY DISCHARGE SUMMARY  Visits from Start of Care: 20  Current functional level related to goals / functional outcomes: See LTGs.    Remaining deficits: decr leg strength, impaired balance, gait abnormalities.    Education / Equipment: HEP   Plan: Patient agrees to discharge.  Patient goals were partially met. Patient is being discharged due to meeting the stated rehab goals.  ?????           Plan - 12/05/20 1107    Clinical Impression Statement Pt met remaining goals with session today. Agreeable to discharge per PT plan.    Personal Factors and Comorbidities Comorbidity 3+;Time since onset of injury/illness/exacerbation;Fitness;Past/Current Experience    Comorbidities HTN, hx of CVA (2017), ovarian cancer    Examination-Activity Limitations Bathing;Locomotion Level;Stand;Transfers;Caring for Others    Examination-Participation Restrictions Community Activity    Stability/Clinical Decision Making Stable/Uncomplicated    Rehab Potential Good    PT Frequency 2x / week    PT Duration 4 weeks    PT Treatment/Interventions ADLs/Self Care Home Management;Aquatic Therapy;DME Instruction;Gait training;Functional mobility  training;Therapeutic activities;Stair training;Therapeutic exercise;Balance training;Neuromuscular re-education;Orthotic Fit/Training;Patient/family education;Passive range of motion;Vestibular    PT Next Visit Plan discharge today per PT plan of care    PT Home Exercise Plan Access Code: [email or web address]    Consulted and Agree with Plan of Care Patient           Patient will benefit from skilled therapeutic intervention in order to improve the following deficits and impairments:  Abnormal gait,Decreased activity tolerance,Decreased balance,Decreased coordination,Decreased endurance,Decreased strength,Difficulty walking,Impaired sensation  Visit Diagnosis: Other abnormalities of gait and mobility  Muscle weakness (generalized)  Other symptoms and signs involving the nervous system     Problem List Patient Active Problem List   Diagnosis Date Noted  . Neuropathic pain 09/19/2020  . Adjustment disorder with mixed anxiety and depressed mood 09/19/2020  . Facial droop 08/30/2020  . Abnormality of gait 05/09/2020  . Hemiplegia of nondominant side, late effect of cerebrovascular disease ([place]) 03/28/2020  . Pleural effusion 03/01/2020  . Sleep disturbance 02/06/2020  . Spastic hemiplegia of left nondominant side due to nontraumatic intraparenchymal hemorrhage of brain ([place]) 12/26/2019  . Trigger thumb of right hand 11/02/2019  . Obstructive sleep apnea 07/26/2019  . Spastic hemiplegia of left nondominant side as late effect of nontraumatic intraparenchymal hemorrhage of brain ([place]) 04/06/2019  . Spastic hemiparesis affecting dominant side ([place]) 01/04/2018  . Hyperglycemia 12/29/2016  . Anxiety and depression 12/29/2016  . Muscle spasticity   . Obesity (BMI 30.0-34.9) 04/08/2016  . Hyperlipidemia 04/08/2016  . Basal ganglia hemorrhage ([place]) 04/08/2016  . Dysphagia as late effect of cerebrovascular disease   . Migraine with aura and without status migrainosus, not intractable   .  Gait  disturbance, post-stroke   . Hemiplegia, post-stroke ([place])   . Aphasia, post-stroke   . Dysarthria, post-stroke   . ICH (intracerebral hemorrhage) (HCC) - R basal ganglia due to hypertensive emergency 04/01/2016  . Upper airway cough syndrome 12/06/2015  . PCP NOTES >>>>>>>>>>>>>>>>>>>>>>>>>>>.. 09/25/2015  . Insomnia 12/28/2012  . Annual physical exam 11/23/2011  . Essential hypertension 10/19/2007    Willow Ora, PTA, [facility] [address], [place], [place] [address] [phone] 12/05/20, 11:47 AM   Name: Abigail Campos MRN: [id number] Date of Birth: [birth year]   Janann August, PT, DPT 12/11/20 9:30 AM

## 2020-12-08 ENCOUNTER — Other Ambulatory Visit: Payer: Self-pay | Admitting: Internal Medicine

## 2020-12-09 ENCOUNTER — Other Ambulatory Visit: Payer: Self-pay | Admitting: Internal Medicine

## 2020-12-09 DIAGNOSIS — Z1231 Encounter for screening mammogram for malignant neoplasm of breast: Secondary | ICD-10-CM

## 2020-12-10 ENCOUNTER — Ambulatory Visit
Admission: RE | Admit: 2020-12-10 | Discharge: 2020-12-10 | Disposition: A | Discharge status: Home / Self Care | Payer: No Typology Code available for payment source | Source: Ambulatory Visit | Attending: Internal Medicine | Admitting: Internal Medicine

## 2020-12-10 ENCOUNTER — Other Ambulatory Visit: Payer: Self-pay

## 2020-12-10 DIAGNOSIS — Z1231 Encounter for screening mammogram for malignant neoplasm of breast: Secondary | ICD-10-CM

## 2020-12-10 IMAGING — MG DIGITAL SCREENING BILAT W/ TOMO W/ CAD
6 of 10 series · 6 of 30 positions shown · non-contrast
Comparison: Previous exam(s).

CLINICAL DATA: Screening.

EXAM:
DIGITAL SCREENING BILATERAL MAMMOGRAM WITH TOMO AND CAD

[L MLO synth-2D]
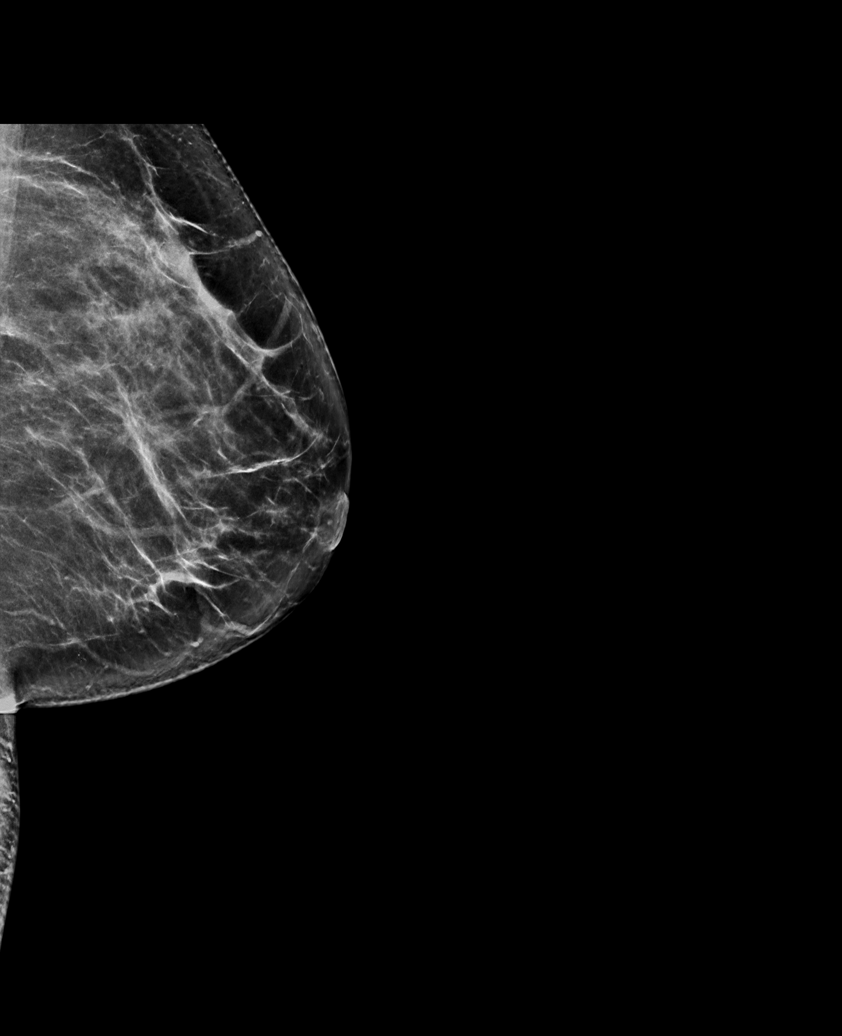

[R MLO synth-2D]
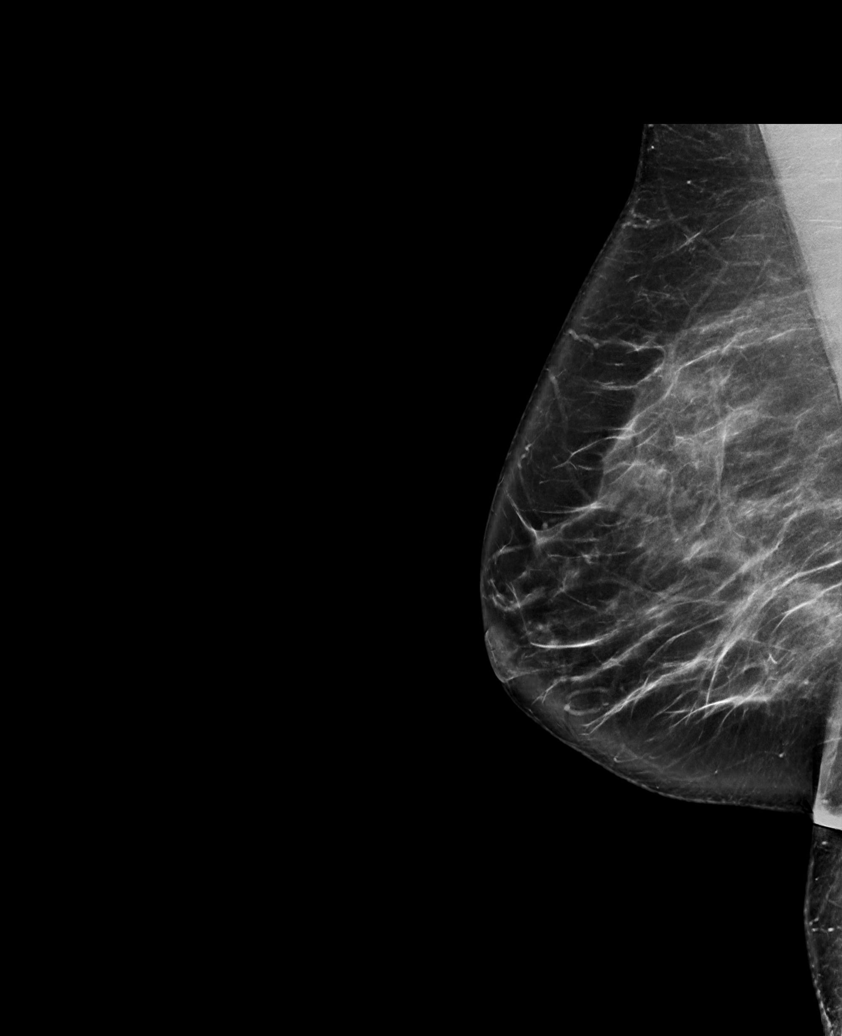

[R CC synth-2D (1 of 2)]
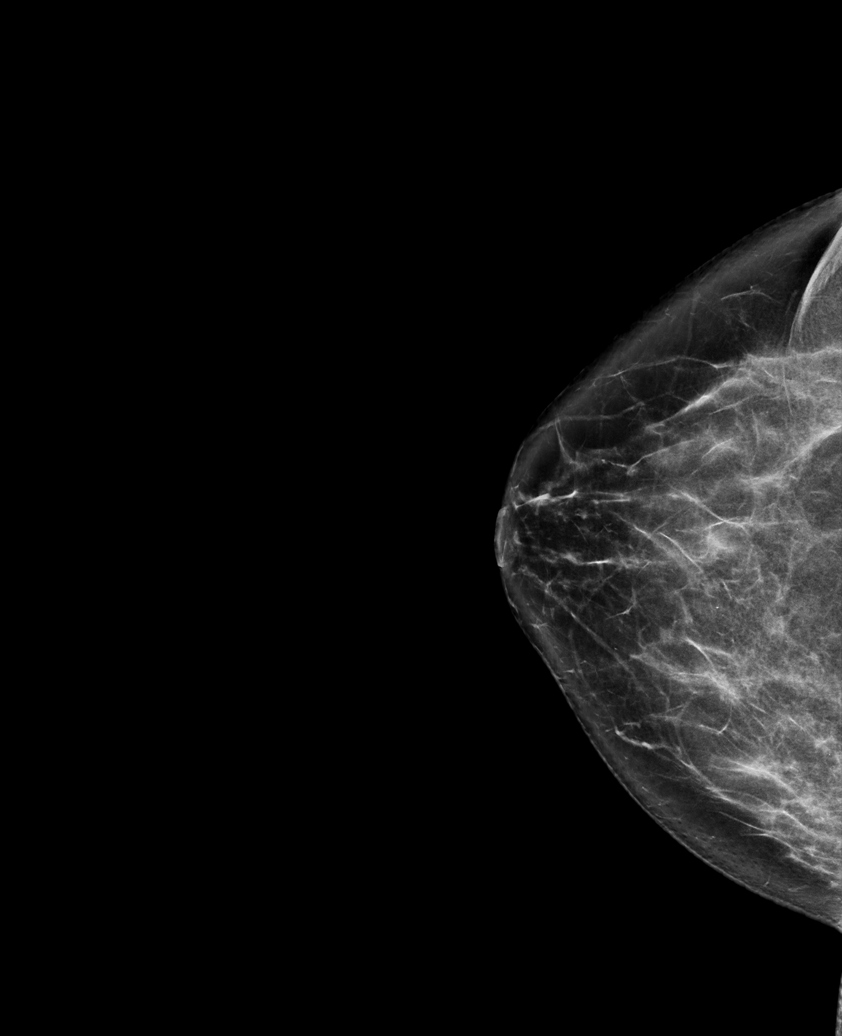

[L CC synth-2D]
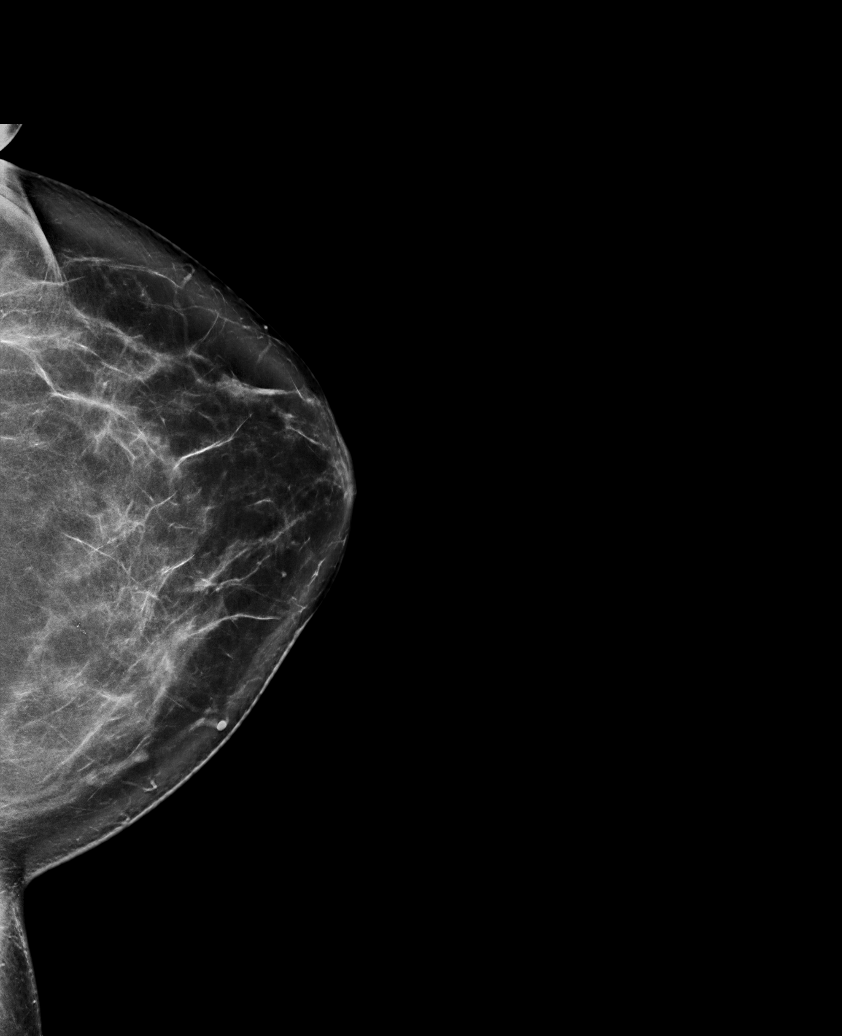

[R CC synth-2D (2 of 2)]
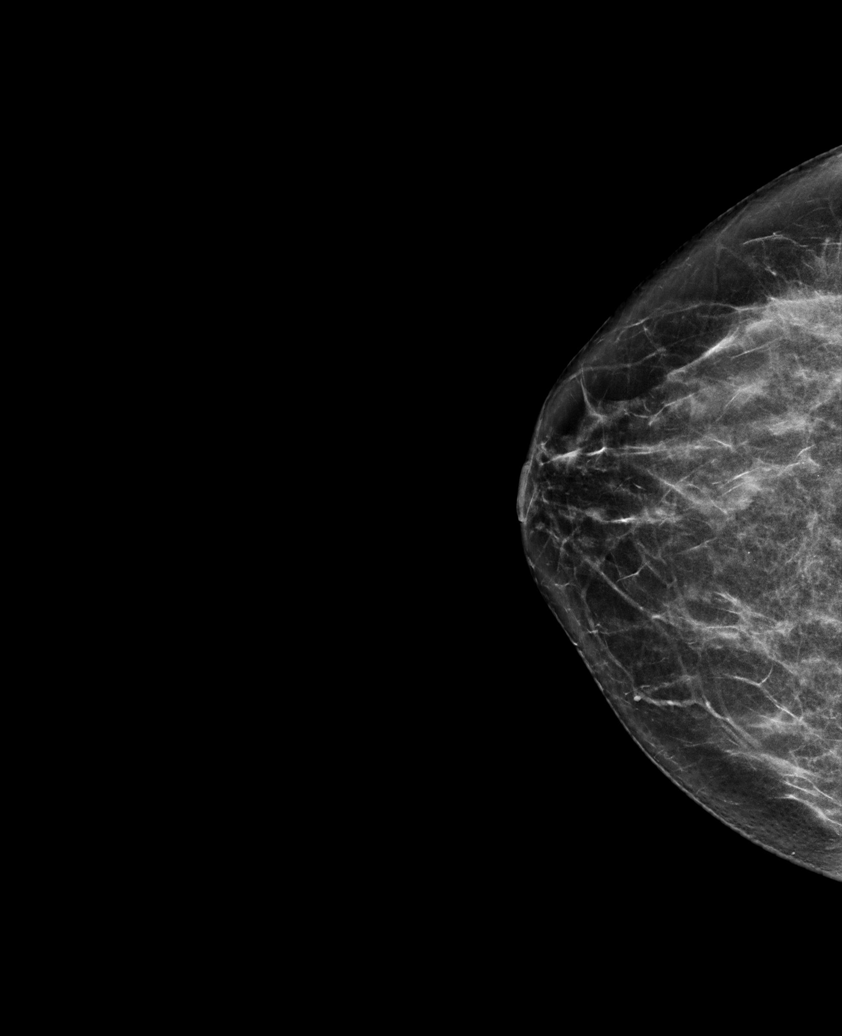

[R MLO tomo · tomo slice 45/88.0]
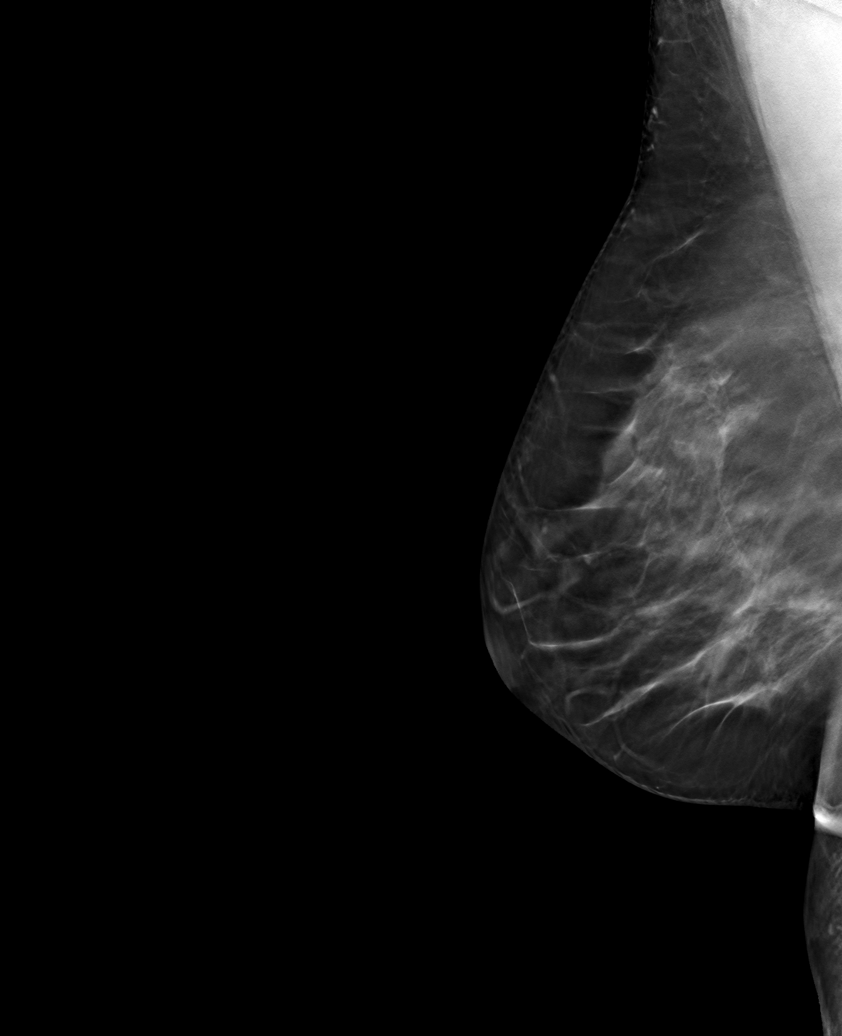

[6 of 30 positions shown; findings below may reference images not displayed]

ACR Breast Density Category b: There are scattered areas of
fibroglandular density.
FINDINGS: There are no findings suspicious for malignancy. Images were
processed with CAD.
IMPRESSION: No mammographic evidence of malignancy. A result letter of this
screening mammogram will be mailed directly to the patient.

RECOMMENDATION:
Screening mammogram in one year. (Code:[TQ])

BI-RADS CATEGORY  1: Negative.

## 2020-12-11 ENCOUNTER — Ambulatory Visit: Payer: No Typology Code available for payment source | Admitting: Physical Therapy

## 2020-12-11 ENCOUNTER — Telehealth: Payer: Self-pay | Admitting: Internal Medicine

## 2020-12-11 NOTE — Telephone Encounter (Signed)
Requesting: [place] 10mg  Contract: None UDS: None Last Visit: 11/19/2020 Next Visit: 05/20/2021 Last Refill: 09/25/2020 #30 and 0RF  Please Advise

## 2020-12-11 NOTE — Telephone Encounter (Signed)
Prescription sent

## 2020-12-12 ENCOUNTER — Ambulatory Visit: Payer: No Typology Code available for payment source | Admitting: Physical Therapy

## 2020-12-24 ENCOUNTER — Other Ambulatory Visit: Payer: Self-pay | Admitting: Physical Medicine & Rehabilitation

## 2020-12-24 ENCOUNTER — Other Ambulatory Visit: Payer: Self-pay | Admitting: Internal Medicine

## 2020-12-26 ENCOUNTER — Encounter (INDEPENDENT_AMBULATORY_CARE_PROVIDER_SITE_OTHER): Payer: Self-pay | Admitting: Family Medicine

## 2020-12-26 ENCOUNTER — Ambulatory Visit (INDEPENDENT_AMBULATORY_CARE_PROVIDER_SITE_OTHER): Payer: No Typology Code available for payment source | Admitting: Family Medicine

## 2020-12-26 ENCOUNTER — Other Ambulatory Visit (INDEPENDENT_AMBULATORY_CARE_PROVIDER_SITE_OTHER): Payer: Self-pay | Admitting: Family Medicine

## 2020-12-26 ENCOUNTER — Other Ambulatory Visit: Payer: Self-pay

## 2020-12-26 VITALS — BP 121/78 | HR 73 | Temp 98.3°F | Ht 59.0 in | Wt 164.0 lb

## 2020-12-26 DIAGNOSIS — E559 Vitamin D deficiency, unspecified: Secondary | ICD-10-CM | POA: Diagnosis not present

## 2020-12-26 DIAGNOSIS — Z0289 Encounter for other administrative examinations: Secondary | ICD-10-CM

## 2020-12-26 DIAGNOSIS — R7303 Prediabetes: Secondary | ICD-10-CM

## 2020-12-26 DIAGNOSIS — G4733 Obstructive sleep apnea (adult) (pediatric): Secondary | ICD-10-CM | POA: Diagnosis not present

## 2020-12-26 DIAGNOSIS — K5909 Other constipation: Secondary | ICD-10-CM

## 2020-12-26 DIAGNOSIS — E669 Obesity, unspecified: Secondary | ICD-10-CM

## 2020-12-26 DIAGNOSIS — Z1331 Encounter for screening for depression: Secondary | ICD-10-CM | POA: Diagnosis not present

## 2020-12-26 DIAGNOSIS — F4320 Adjustment disorder, unspecified: Secondary | ICD-10-CM

## 2020-12-26 DIAGNOSIS — R5383 Other fatigue: Secondary | ICD-10-CM | POA: Diagnosis not present

## 2020-12-26 DIAGNOSIS — Z8673 Personal history of transient ischemic attack (TIA), and cerebral infarction without residual deficits: Secondary | ICD-10-CM | POA: Diagnosis not present

## 2020-12-26 DIAGNOSIS — Z9189 Other specified personal risk factors, not elsewhere classified: Secondary | ICD-10-CM | POA: Diagnosis not present

## 2020-12-26 DIAGNOSIS — Z6833 Body mass index (BMI) 33.0-33.9, adult: Secondary | ICD-10-CM

## 2020-12-26 DIAGNOSIS — I10 Essential (primary) hypertension: Secondary | ICD-10-CM

## 2020-12-26 DIAGNOSIS — G4709 Other insomnia: Secondary | ICD-10-CM

## 2020-12-26 DIAGNOSIS — E7849 Other hyperlipidemia: Secondary | ICD-10-CM

## 2020-12-26 DIAGNOSIS — R0602 Shortness of breath: Secondary | ICD-10-CM

## 2020-12-27 LAB — COMPREHENSIVE METABOLIC PANEL
ALT: 34 IU/L — ABNORMAL HIGH (ref 0–32)
AST: 27 IU/L (ref 0–40)
Albumin/Globulin Ratio: 1.5 (ref 1.2–2.2)
Albumin: 4.5 g/dL (ref 3.8–4.8)
Alkaline Phosphatase: 103 IU/L (ref 44–121)
BUN/Creatinine Ratio: 9 (ref 9–23)
BUN: 9 mg/dL (ref 6–24)
Bilirubin Total: 0.4 mg/dL (ref 0.0–1.2)
CO2: 24 mmol/L (ref 20–29)
Calcium: 9.4 mg/dL (ref 8.7–10.2)
Chloride: 98 mmol/L (ref 96–106)
Creatinine, Ser: 0.95 mg/dL (ref 0.57–1.00)
GFR calc Af Amer: 82 mL/min/{1.73_m2} (ref >59)
GFR calc non Af Amer: 72 mL/min/{1.73_m2} (ref >59)
Globulin, Total: 3.1 g/dL (ref 1.5–4.5)
Glucose: 98 mg/dL (ref 65–99)
Potassium: 4.4 mmol/L (ref 3.5–5.2)
Sodium: 137 mmol/L (ref 134–144)
Total Protein: 7.6 g/dL (ref 6.0–8.5)

## 2020-12-27 LAB — CBC WITH DIFFERENTIAL/PLATELET
Basophils Absolute: 0 10*3/uL (ref 0.0–0.2)
Basos: 0 %
EOS (ABSOLUTE): 0 10*3/uL (ref 0.0–0.4)
Eos: 0 %
Hematocrit: 41.3 % (ref 34.0–46.6)
Hemoglobin: 13.8 g/dL (ref 11.1–15.9)
Immature Grans (Abs): 0 10*3/uL (ref 0.0–0.1)
Immature Granulocytes: 0 %
Lymphocytes Absolute: 1.5 10*3/uL (ref 0.7–3.1)
Lymphs: 31 %
MCH: 31.1 pg (ref 26.6–33.0)
MCHC: 33.4 g/dL (ref 31.5–35.7)
MCV: 93 fL (ref 79–97)
Monocytes Absolute: 0.4 10*3/uL (ref 0.1–0.9)
Monocytes: 9 %
Neutrophils Absolute: 2.8 10*3/uL (ref 1.4–7.0)
Neutrophils: 60 %
Platelets: 191 10*3/uL (ref 150–450)
RBC: 4.44 x10E6/uL (ref 3.77–5.28)
RDW: 12.6 % (ref 11.7–15.4)
WBC: 4.7 10*3/uL (ref 3.4–10.8)

## 2020-12-27 LAB — LIPID PANEL
Chol/HDL Ratio: 2.6 ratio (ref 0.0–4.4)
Cholesterol, Total: 118 mg/dL (ref 100–199)
HDL: 45 mg/dL (ref >39)
LDL Chol Calc (NIH): 61 mg/dL (ref 0–99)
Triglycerides: 55 mg/dL (ref 0–149)
VLDL Cholesterol Cal: 12 mg/dL (ref 5–40)

## 2020-12-27 LAB — ANEMIA PANEL
Ferritin: 72 ng/mL (ref 15–150)
Folate, Hemolysate: 344 ng/mL
Folate, RBC: 819 ng/mL (ref >498)
Hematocrit: 42 % (ref 34.0–46.6)
Iron Saturation: 31 % (ref 15–55)
Iron: 97 ug/dL (ref 27–159)
Retic Ct Pct: 1 % (ref 0.6–2.6)
Total Iron Binding Capacity: 313 ug/dL (ref 250–450)
UIBC: 216 ug/dL (ref 131–425)
Vitamin B-12: 555 pg/mL (ref 232–1245)

## 2020-12-27 LAB — T4, FREE: Free T4: 1.3 ng/dL (ref 0.82–1.77)

## 2020-12-27 LAB — TSH: TSH: 1.31 u[IU]/mL (ref 0.450–4.500)

## 2020-12-31 NOTE — Progress Notes (Signed)
Chief Complaint:   OBESITY Abigail Campos (MR# [id number]) is a 50 y.o. female who presents for evaluation and treatment of obesity and related comorbidities. Current BMI is Body mass index is 33.12 kg/m. Abigail Campos has been struggling with her weight for many years and has been unsuccessful in either losing weight, maintaining weight loss, or reaching her healthy weight goal.  Abigail Campos is currently in the action stage of change and ready to dedicate time achieving and maintaining a healthier weight. Abigail Campos is interested in becoming our patient and working on intensive lifestyle modifications including (but not limited to) diet and exercise for weight loss.  Abigail Campos lives with her son (53) and her husband, Abigail Campos (44).  She also has a daughter (82).  She wants to lose ~25 pounds.  She says she drinks soda.  She craves salt.  She says she has been trying not to eat after 7:30 pm.  Abigail Campos's habits were reviewed today and are as follows: her desired weight loss is 25 pounds, she has been heavy most of her life, she started gaining weight in 2017, her heaviest weight ever was 180 pounds, she craves sweets, she skips lunch frequently, she is frequently drinking liquids with calories, she frequently makes poor food choices, she frequently eats larger portions than normal and she struggles with emotional eating.  Depression Screen Abigail Campos's Food and Mood (modified PHQ-9) score was 16.  Depression screen PHQ 2/9 12/26/2020  Decreased Interest 0  Down, Depressed, Hopeless 2  PHQ - 2 Score 2  Altered sleeping 3  Tired, decreased energy 3  Change in appetite 2  Feeling bad or failure about yourself  2  Trouble concentrating 1  Moving slowly or fidgety/restless 2  Suicidal thoughts 1  PHQ-9 Score 16  Difficult doing work/chores Somewhat difficult  Some recent data might be hidden   Assessment/Plan:   1. Other fatigue Faydra admits to daytime somnolence and reports waking up still tired. Patent has a  history of symptoms of daytime fatigue, morning fatigue and snoring. Teleah generally gets 7 hours of sleep per night, and states that she has poor quality sleep. Snoring is present. Apneic episodes are not present. Epworth Sleepiness Score is 12.  Aerilyn does feel that her weight is causing her energy to be lower than it should be. Fatigue may be related to obesity, depression or many other causes. Labs will be ordered, and in the meanwhile, Abigail Campos will focus on self care including making healthy food choices, increasing physical activity and focusing on stress reduction.  - EKG 12-Lead - CBC with Differential/Platelet - Comprehensive metabolic panel - T4, free - TSH - Anemia panel - Lipid panel  2. SOB (shortness of breath) on exertion Jakaria notes increasing shortness of breath with exercising and seems to be worsening over time with weight gain. She notes getting out of breath sooner with activity than she used to. This has gotten worse recently. Abigail Campos denies shortness of breath at rest or orthopnea.  Abigail Campos does feel that she gets out of breath more easily that she used to when she exercises. Chastity's shortness of breath appears to be obesity related and exercise induced. She has agreed to work on weight loss and gradually increase exercise to treat her exercise induced shortness of breath. Will continue to monitor closely.  - CBC with Differential/Platelet - Comprehensive metabolic panel - T4, free - TSH - Anemia panel - Lipid panel  3. History of CVA (cerebrovascular accident) Abigail Campos had a CVA in  2017.  She is taking Plavix 75 mg daily. We will continue to monitor symptoms as they relate to her weight loss journey.  4. OSA (obstructive sleep apnea) Abigail Campos is followed by Pulmonology.  Reviewed 11/18/2020 note.  They plan to repeat her sleep study in 2022.  She has a rental CPAP at this time.  Epworth score is 12.  She has an allergy to latex.  OSA is a cause of systemic hypertension and is  associated with an increased incidence of stroke, heart failure, atrial fibrillation, and coronary heart disease. Severe OSA increases all-cause mortality and  cardiovascular mortality.   Goal: Treatment of OSA via CPAP compliance and weight loss. . Plasma ghrelin levels (appetite or "hunger hormone") are significantly higher in OSA patients than in BMI-matched controls, but decrease to levels similar to those of obese patients without OSA after CPAP treatment.  . Weight loss improves OSA by several mechanisms, including reduction in fatty tissue in the throat (i.e. parapharyngeal fat) and the tongue. Loss of abdominal fat increases mediastinal traction on the upper airway making it less likely to collapse during sleep. . Studies have also shown that compliance with CPAP treatment improves leptin (hunger inhibitory hormone) imbalance.  5. Essential hypertension At goal. Medications: lisinopril 2.5 mg daily and Coreg 6.25 mg twice daily.   Plan: Avoid buying foods that are: processed, frozen, or prepackaged to avoid excess salt. We will continue to monitor symptoms as they relate to her weight loss journey.  BP Readings from Last 3 Encounters:  12/26/20 121/78  11/19/20 131/87  11/18/20 126/70   Lab Results  Component Value Date   CREATININE 0.95 12/26/2020   - CBC with Differential/Platelet - Comprehensive metabolic panel - T4, free - TSH  6. Other hyperlipidemia Course: Stable. Lipid-lowering medications: Lipitor 20 mg daily.   Plan: Dietary changes: Increase soluble fiber. Decrease simple carbohydrates. Exercise changes: An average 40 minutes of moderate to vigorous-intensity aerobic activity 3 or 4 times per week.   Lab Results  Component Value Date   CHOL 118 12/26/2020   HDL 45 12/26/2020   LDLCALC 61 12/26/2020   TRIG 55 12/26/2020   CHOLHDL 2.6 12/26/2020   Lab Results  Component Value Date   ALT 34 (H) 12/26/2020   AST 27 12/26/2020   ALKPHOS 103 12/26/2020    BILITOT 0.4 12/26/2020   - Lipid panel  7. Vitamin D deficiency Not at goal. Current vitamin D is 17.70, tested on 11/19/2020. Optimal goal > 50 ng/dL.   Plan:  [x]   Continue Vitamin D @50 ,000 IU every week. []   Continue home supplement daily. [x]   Follow-up for routine testing of Vitamin D at least 2-3 times per year to avoid over-replacement.  8. Other insomnia Average hours of sleep per night: 7.   Plan: Recommend sleep hygiene measures including regular sleep schedule, optimal sleep environment, and relaxing presleep rituals. Medication: Sonata 10 mg at bedtime as needed for sleep.  9. Prediabetes Abigail Campos drinks regular sodas daily.  Goal is HgbA1c < 5.7.  She will continue to focus on protein-rich, low simple carbohydrate foods. We reviewed the importance of hydration, regular exercise for stress reduction, and restorative sleep.   Lab Results  Component Value Date   HGBA1C 6.0 11/19/2020   10. Other constipation Getting to Good Bowel Health: Your goal is to have one soft bowel movement each day. Drink at least 8 glasses of water each day. Eat plenty of fiber (goal is over 30 grams each day). It is  best to get most of your fiber from dietary sources which includes leafy green vegetables, fresh fruit, and whole grains. You may need to add fiber with the help of OTC fiber supplements. These include Metamucil, Citrucel, and Benefiber. If you are still having trouble, try adding Miralax. If all of these changes do not work, contact me for further direction.  11. Adjustment disorder, unspecified type Abigail Campos is taking Wellbutrin 100 mg twice daily and Lexapro 20 mg daily.  PHQ-9 is 16.  12. Depression screening Abigail Campos was screened for depression as part of her new patient workup today.  PHQ-9 is 16.  13. At risk for diabetes mellitus Abigail Campos was given approximately 8 minutes of diabetes education and counseling today. We discussed intensive lifestyle modifications today with an emphasis on  weight loss as well as increasing exercise and decreasing simple carbohydrates in her diet. We also reviewed medication options with an emphasis on risk versus benefit of those discussed.   14. Class 1 obesity with serious comorbidity and body mass index (BMI) of 33.0 to 33.9 in adult, unspecified obesity type  Abigail Campos is currently in the action stage of change and her goal is to continue with weight loss efforts. I recommend Lenise begin the structured treatment plan as follows:  She has agreed to the Category 1 Plan.  Exercise goals: No exercise has been prescribed at this time.   Behavioral modification strategies: increasing lean protein intake, decreasing simple carbohydrates, increasing vegetables, increasing water intake and decreasing liquid calories.  She was informed of the importance of frequent follow-up visits to maximize her success with intensive lifestyle modifications for her multiple health conditions. She was informed we would discuss her lab results at her next visit unless there is a critical issue that needs to be addressed sooner. Bayley agreed to keep her next visit at the agreed upon time to discuss these results.  Objective:   Blood pressure 121/78, pulse 73, temperature 98.3 F (36.8 C), temperature source Oral, height 4\' 11"  (1.499 m), weight 164 lb (74.4 kg), SpO2 95 %. Body mass index is 33.12 kg/m.  EKG: Normal sinus rhythm, rate 70 bpm.  Indirect Calorimeter completed today shows a VO2 of 183 and a REE of 1272.  Her calculated basal metabolic rate is [id number] thus her basal metabolic rate is worse than expected.  General: Cooperative, alert, well developed, in no acute distress. HEENT: Conjunctivae and lids unremarkable. Cardiovascular: Regular rhythm.  Lungs: Normal work of breathing. Neurologic: No focal deficits.   Lab Results  Component Value Date   CREATININE 0.95 12/26/2020   BUN 9 12/26/2020   NA 137 12/26/2020   K 4.4 12/26/2020   CL 98 12/26/2020    CO2 24 12/26/2020   Lab Results  Component Value Date   ALT 34 (H) 12/26/2020   AST 27 12/26/2020   ALKPHOS 103 12/26/2020   BILITOT 0.4 12/26/2020   Lab Results  Component Value Date   HGBA1C 6.0 11/19/2020   HGBA1C 6.1 (H) 08/31/2020   HGBA1C 5.7 02/14/2020   HGBA1C 6.0 07/12/2019   HGBA1C 6.2 02/27/2019   Lab Results  Component Value Date   TSH 1.310 12/26/2020   Lab Results  Component Value Date   CHOL 118 12/26/2020   HDL 45 12/26/2020   LDLCALC 61 12/26/2020   TRIG 55 12/26/2020   CHOLHDL 2.6 12/26/2020   Lab Results  Component Value Date   WBC 4.7 12/26/2020   HGB 13.8 12/26/2020   HCT 41.3 12/26/2020  MCV 93 12/26/2020   PLT 191 12/26/2020   Lab Results  Component Value Date   IRON 97 12/26/2020   TIBC 313 12/26/2020   FERRITIN 72 12/26/2020   Attestation Statements:   Reviewed by clinician on day of visit: allergies, medications, problem list, medical history, surgical history, family history, social history, and previous encounter notes.  This is the patient's first visit at [facility]. The patient's NEW PATIENT PACKET was reviewed at length. Included in the packet: current and past health history, medications, allergies, ROS, gynecologic history (women only), surgical history, family history, social history, weight history, weight loss surgery history (for those that have had weight loss surgery), nutritional evaluation, mood and food questionnaire, PHQ9, Epworth questionnaire, sleep habits questionnaire, patient life and health improvement goals questionnaire. These will all be scanned into the patient's chart under media.   During the visit, I independently reviewed the patient's EKG, bioimpedance scale results, and indirect calorimeter results. I used this information to tailor a meal plan for the patient that will help her to lose weight and will improve her obesity-related conditions going forward. I performed a medically necessary  appropriate examination and/or evaluation. I discussed the assessment and treatment plan with the patient. The patient was provided an opportunity to ask questions and all were answered. The patient agreed with the plan and demonstrated an understanding of the instructions. Labs were ordered at this visit and will be reviewed at the next visit unless more critical results need to be addressed immediately. Clinical information was updated and documented in the EMR.   I, Water quality scientist, CMA, am acting as transcriptionist for Briscoe Deutscher, DO  I have reviewed the above documentation for accuracy and completeness, and I agree with the above. Briscoe Deutscher, DO

## 2021-01-04 ENCOUNTER — Encounter: Payer: Self-pay | Admitting: Internal Medicine

## 2021-01-06 ENCOUNTER — Encounter (HOSPITAL_BASED_OUTPATIENT_CLINIC_OR_DEPARTMENT_OTHER): Payer: Self-pay | Admitting: *Deleted

## 2021-01-06 ENCOUNTER — Other Ambulatory Visit: Payer: Self-pay

## 2021-01-06 ENCOUNTER — Emergency Department (HOSPITAL_BASED_OUTPATIENT_CLINIC_OR_DEPARTMENT_OTHER)
Admission: EM | Admit: 2021-01-06 | Discharge: 2021-01-06 | Disposition: A | Discharge status: Home / Self Care | Payer: No Typology Code available for payment source | Attending: Emergency Medicine | Admitting: Emergency Medicine

## 2021-01-06 ENCOUNTER — Emergency Department (HOSPITAL_BASED_OUTPATIENT_CLINIC_OR_DEPARTMENT_OTHER): Payer: No Typology Code available for payment source

## 2021-01-06 ENCOUNTER — Encounter: Payer: Medicare Other | Admitting: Physical Medicine & Rehabilitation

## 2021-01-06 ENCOUNTER — Encounter: Payer: Self-pay | Admitting: Internal Medicine

## 2021-01-06 DIAGNOSIS — Z7902 Long term (current) use of antithrombotics/antiplatelets: Secondary | ICD-10-CM | POA: Diagnosis not present

## 2021-01-06 DIAGNOSIS — Z91040 Latex allergy status: Secondary | ICD-10-CM | POA: Insufficient documentation

## 2021-01-06 DIAGNOSIS — Z91010 Allergy to peanuts: Secondary | ICD-10-CM | POA: Diagnosis not present

## 2021-01-06 DIAGNOSIS — I517 Cardiomegaly: Secondary | ICD-10-CM | POA: Diagnosis not present

## 2021-01-06 DIAGNOSIS — I10 Essential (primary) hypertension: Secondary | ICD-10-CM | POA: Diagnosis not present

## 2021-01-06 DIAGNOSIS — Z79899 Other long term (current) drug therapy: Secondary | ICD-10-CM | POA: Diagnosis not present

## 2021-01-06 DIAGNOSIS — Z8543 Personal history of malignant neoplasm of ovary: Secondary | ICD-10-CM | POA: Diagnosis not present

## 2021-01-06 DIAGNOSIS — R059 Cough, unspecified: Secondary | ICD-10-CM

## 2021-01-06 DIAGNOSIS — U071 COVID-19: Secondary | ICD-10-CM | POA: Insufficient documentation

## 2021-01-06 LAB — RAPID INFLUENZA A&B ANTIGENS
Influenza A (ARMC): NEGATIVE
Influenza B (ARMC): NEGATIVE

## 2021-01-06 IMAGING — DX DG CHEST 1V PORT
1 series · 1 of 1 positions shown · non-contrast
Comparison: [DATE]

CLINICAL DATA: Cough

EXAM:
PORTABLE CHEST 1 VIEW

[chest ap]
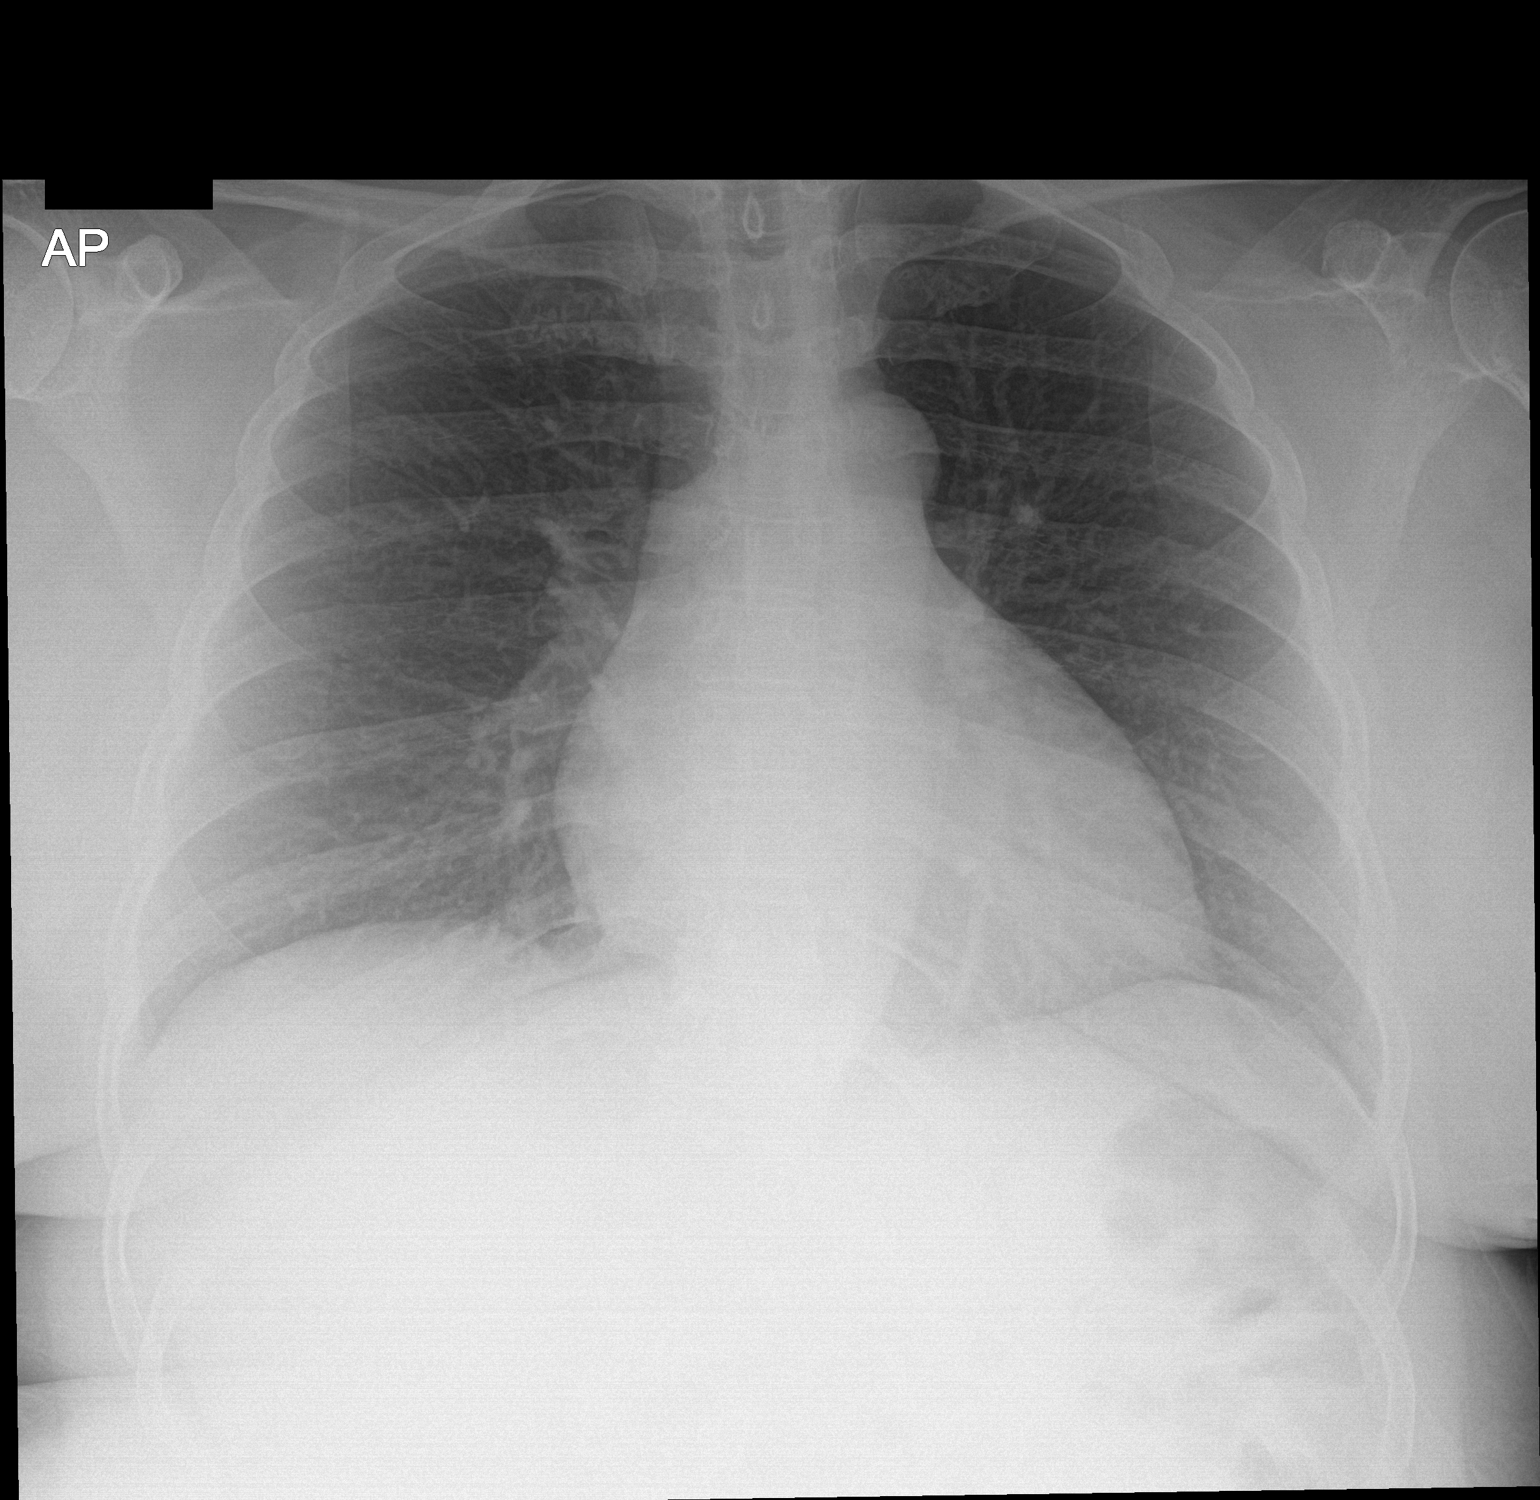

[1 of 1 positions shown; findings below may reference images not displayed]

FINDINGS: Mild cardiomegaly. Both lungs are clear. The visualized skeletal
structures are unremarkable.
IMPRESSION: Mild cardiomegaly without acute abnormality of the lungs in AP
portable projection.

## 2021-01-06 MED ORDER — BENZONATATE 100 MG PO CAPS: 100.0000 mg | ORAL_CAPSULE | Freq: Three times a day (TID) | ORAL | 0 refills | Status: DC

## 2021-01-06 MED ORDER — ACETAMINOPHEN 325 MG PO TABS
650.0000 mg | ORAL_TABLET | Freq: Once | ORAL | Status: AC
  Administered 2021-01-06: 650 mg via ORAL
  Filled 2021-01-06: qty 2

## 2021-01-06 NOTE — ED Triage Notes (Signed)
Cough and congestion x 1 week. She feels like she has the flu.

## 2021-01-06 NOTE — Discharge Instructions (Signed)
We have tested you for COVID 19, we will call you within the next 48 hours if results are positive, you may also view these results on MyChart. Your flu test was negative. Your chest x-ray showed that your heart is mildly large, however there is no focal pneumonia or fluid in your lungs.  We are instructing patient's with COVID 19 or symptoms of COVID 19 to follow the below instructions regardless of vaccination status:  - Stay home for 5 days. - If you have no symptoms or your symptoms are resolving after 5 days, you can leave your house--> Continue to wear a mask around others for 5 additional days. - If you have a fever, continue to stay home until your fever resolves.days.   We are sending you home with tessalon to take every 8 hours as needed for coughing.  We have prescribed you new medication(s) today. Discuss the medications prescribed today with your pharmacist as they can have adverse effects and interactions with your other medicines including over the counter and prescribed medications. Seek medical evaluation if you start to experience new or abnormal symptoms after taking one of these medicines, seek care immediately if you start to experience difficulty breathing, feeling of your throat closing, facial swelling, or rash as these could be indications of a more serious allergic reaction  Please follow up with primary care within 3-5 days for re-evaluation- call prior to going to the office to make them aware of your symptoms as some offices are altering their method of seeing patients with COVID 19 symptoms, we have also provided our [place] covid clinic for follow up as well.  Return to the ER for new or worsening symptoms including but not limited to increased work of breathing, chest pain, passing out, or any other concerns.       Person Under Monitoring Name: Abigail Campos  Location: [address] [place] [address]   Infection Prevention Recommendations for  Individuals Confirmed to have, or Being Evaluated for, 2019 Novel Coronavirus (COVID-19) Infection Who Receive Care at Home  Individuals who are confirmed to have, or are being evaluated for, COVID-19 should follow the prevention steps below until a healthcare provider or local or state health department says they can return to normal activities.  Stay home except to get medical care You should restrict activities outside your home, except for getting medical care. Do not go to work, school, or public areas, and do not use public transportation or taxis.  Call ahead before visiting your doctor Before your medical appointment, call the healthcare provider and tell them that you have, or are being evaluated for, COVID-19 infection. This will help the healthcare providers office take steps to keep other people from getting infected. Ask your healthcare provider to call the local or state health department.  Monitor your symptoms Seek prompt medical attention if your illness is worsening (e.g., difficulty breathing). Before going to your medical appointment, call the healthcare provider and tell them that you have, or are being evaluated for, COVID-19 infection. Ask your healthcare provider to call the local or state health department.  Wear a facemask You should wear a facemask that covers your nose and mouth when you are in the same room with other people and when you visit a healthcare provider. People who live with or visit you should also wear a facemask while they are in the same room with you.  Separate yourself from other people in your home As much as possible,  you should stay in a different room from other people in your home. Also, you should use a separate bathroom, if available.  Avoid sharing household items You should not share dishes, drinking glasses, cups, eating utensils, towels, bedding, or other items with other people in your home. After using these items, you  should wash them thoroughly with soap and water.  Cover your coughs and sneezes Cover your mouth and nose with a tissue when you cough or sneeze, or you can cough or sneeze into your sleeve. Throw used tissues in a lined trash can, and immediately wash your hands with soap and water for at least 20 seconds or use an alcohol-based hand rub.  Wash your [facility] your hands often and thoroughly with soap and water for at least 20 seconds. You can use an alcohol-based hand sanitizer if soap and water are not available and if your hands are not visibly dirty. Avoid touching your eyes, nose, and mouth with unwashed hands.   Prevention Steps for Caregivers and Household Members of Individuals Confirmed to have, or Being Evaluated for, COVID-19 Infection Being Cared for in the Home  If you live with, or provide care at home for, a person confirmed to have, or being evaluated for, COVID-19 infection please follow these guidelines to prevent infection:  Follow healthcare providers instructions Make sure that you understand and can help the patient follow any healthcare provider instructions for all care.  Provide for the patients basic needs You should help the patient with basic needs in the home and provide support for getting groceries, prescriptions, and other personal needs.  Monitor the patients symptoms If they are getting sicker, call his or her medical provider and tell them that the patient has, or is being evaluated for, COVID-19 infection. This will help the healthcare providers office take steps to keep other people from getting infected. Ask the healthcare provider to call the local or state health department.  Limit the number of people who have contact with the patient If possible, have only one caregiver for the patient. Other household members should stay in another home or place of residence. If this is not possible, they should stay in another room, or be separated  from the patient as much as possible. Use a separate bathroom, if available. Restrict visitors who do not have an essential need to be in the home.  Keep older adults, very young children, and other sick people away from the patient Keep older adults, very young children, and those who have compromised immune systems or chronic health conditions away from the patient. This includes people with chronic heart, lung, or kidney conditions, diabetes, and cancer.  Ensure good ventilation Make sure that shared spaces in the home have good air flow, such as from an air conditioner or an opened window, weather permitting.  Wash your hands often Wash your hands often and thoroughly with soap and water for at least 20 seconds. You can use an alcohol based hand sanitizer if soap and water are not available and if your hands are not visibly dirty. Avoid touching your eyes, nose, and mouth with unwashed hands. Use disposable paper towels to dry your hands. If not available, use dedicated cloth towels and replace them when they become wet.  Wear a facemask and gloves Wear a disposable facemask at all times in the room and gloves when you touch or have contact with the patients blood, body fluids, and/or secretions or excretions, such as sweat, saliva, sputum,  nasal mucus, vomit, urine, or feces.  Ensure the mask fits over your nose and mouth tightly, and do not touch it during use. Throw out disposable facemasks and gloves after using them. Do not reuse. Wash your hands immediately after removing your facemask and gloves. If your personal clothing becomes contaminated, carefully remove clothing and launder. Wash your hands after handling contaminated clothing. Place all used disposable facemasks, gloves, and other waste in a lined container before disposing them with other household waste. Remove gloves and wash your hands immediately after handling these items.  Do not share dishes, glasses, or other  household items with the patient Avoid sharing household items. You should not share dishes, drinking glasses, cups, eating utensils, towels, bedding, or other items with a patient who is confirmed to have, or being evaluated for, COVID-19 infection. After the person uses these items, you should wash them thoroughly with soap and water.  Wash laundry thoroughly Immediately remove and wash clothes or bedding that have blood, body fluids, and/or secretions or excretions, such as sweat, saliva, sputum, nasal mucus, vomit, urine, or feces, on them. Wear gloves when handling laundry from the patient. Read and follow directions on labels of laundry or clothing items and detergent. In general, wash and dry with the warmest temperatures recommended on the label.  Clean all areas the individual has used often Clean all touchable surfaces, such as counters, tabletops, doorknobs, bathroom fixtures, toilets, phones, keyboards, tablets, and bedside tables, every day. Also, clean any surfaces that may have blood, body fluids, and/or secretions or excretions on them. Wear gloves when cleaning surfaces the patient has come in contact with. Use a diluted bleach solution (e.g., dilute bleach with 1 part bleach and 10 parts water) or a household disinfectant with a label that says EPA-registered for coronaviruses. To make a bleach solution at home, add 1 tablespoon of bleach to 1 quart (4 cups) of water. For a larger supply, add  cup of bleach to 1 gallon (16 cups) of water. Read labels of cleaning products and follow recommendations provided on product labels. Labels contain instructions for safe and effective use of the cleaning product including precautions you should take when applying the product, such as wearing gloves or eye protection and making sure you have good ventilation during use of the product. Remove gloves and wash hands immediately after cleaning.  Monitor yourself for signs and symptoms of  illness Caregivers and household members are considered close contacts, should monitor their health, and will be asked to limit movement outside of the home to the extent possible. Follow the monitoring steps for close contacts listed on the symptom monitoring form.   ? If you have additional questions, contact your local health department or call the epidemiologist on call at [phone] (available 24/7). ? This guidance is subject to change. For the most up-to-date guidance from [facility], please refer to their website: [email or web address]

## 2021-01-06 NOTE — ED Provider Notes (Signed)
[facility]ERGENCY DEPARTMENT Provider Note   [name]: [id number] Arrival date & time: 01/06/21  1404     History Chief Complaint  Patient presents with  . Cough  . Fever    Abigail Campos is a 50 y.o. female with a history of prior stroke- ICH, aphasia & hemiplegia post stroke, hypertension, anxiety, and depression who presents to the ED with complaints of congestion x 1 week with development of cough over past few days. Patient states she has had nasal congestion with subsequent development of dry/harsh cough, having some central chest discomfort only with coughing otherwise no pain. No other alleviating/aggravating factors to her sxs. Denies fever at home (febrile in triage), chills, ear pain, sore throat, dyspnea,  leg pain/swelling, hemoptysis, recent surgery/trauma, recent long travel, hormone use, personal hx of cancer, or hx of DVT/PE. Has received her COVID 19 vaccine- due for booster in march.     HPI     Past Medical History:  Diagnosis Date  . Anemia   . Anxiety and depression   . Back pain   . Essential hypertension 10/19/2007   03-2010: metoprolol changed to bystolic (was not feeling well on it, no specific allergy or reaction)    . Food allergy   . Hemiplegia ([place])   . Hyperlipemia   . Hypertension   . Joint pain   . Migraine   . Neuromuscular disorder ([place])    left sided hemiplegia - has brace for l leg and walks with a cane  . Obesity   . Ovarian cancer ([place])   . Prediabetes   . Sleep apnea    not wearing c-pap yet  . Sleep apnea   . Stroke ([place]) 2017  . Vitamin D deficiency     Patient Active Problem List   Diagnosis Date Noted  . Neuropathic pain 09/19/2020  . Adjustment disorder with mixed anxiety and depressed mood 09/19/2020  . Facial droop 08/30/2020  . Abnormality of gait 05/09/2020  . Hemiplegia of nondominant side, late effect of cerebrovascular disease ([place]) 03/28/2020  . Pleural effusion 03/01/2020  . Sleep disturbance  02/06/2020  . Spastic hemiplegia of left nondominant side due to nontraumatic intraparenchymal hemorrhage of brain ([place]) 12/26/2019  . Trigger thumb of right hand 11/02/2019  . Obstructive sleep apnea 07/26/2019  . Spastic hemiplegia of left nondominant side as late effect of nontraumatic intraparenchymal hemorrhage of brain ([place]) 04/06/2019  . Spastic hemiparesis affecting dominant side ([place]) 01/04/2018  . Hyperglycemia 12/29/2016  . Anxiety and depression 12/29/2016  . Muscle spasticity   . Obesity (BMI 30.0-34.9) 04/08/2016  . Hyperlipidemia 04/08/2016  . Basal ganglia hemorrhage ([place]) 04/08/2016  . Dysphagia as late effect of cerebrovascular disease   . Migraine with aura and without status migrainosus, not intractable   . Gait disturbance, post-stroke   . Hemiplegia, post-stroke ([place])   . Aphasia, post-stroke   . Dysarthria, post-stroke   . ICH (intracerebral hemorrhage) ([facility]) - R basal ganglia due to hypertensive emergency 04/01/2016  . Upper airway cough syndrome 12/06/2015  . PCP NOTES >>>>>>>>>>>>>>>>>>>>>>>>>>>.. 09/25/2015  . Insomnia 12/28/2012  . Annual physical exam 11/23/2011  . Essential hypertension 10/19/2007    Past Surgical History:  Procedure Laterality Date  . ABDOMINAL HYSTERECTOMY  11-14-07   no oophorectomy per surgical report  . CESAREAN SECTION     [id number]  . INGUINAL HERNIA REPAIR     2004     OB History    Gravida  2   Para  2   Term  2   Preterm      AB      Living  2     SAB      IAB      Ectopic      Multiple      Live Births  2           Family History  Problem Relation Age of Onset  . Diabetes Mother   . Hypertension Mother   . Glaucoma Mother   . Colon cancer Mother        mother age 63  . Cancer Mother   . Anxiety disorder Mother   . Obesity Mother   . Rectal cancer Father        dx 24  . Hypertension Father   . Cancer Father   . Sleep apnea Father   . Alcoholism Father   . Obesity Father   . Stroke  Paternal Grandfather   . Stroke Maternal Grandfather   . Diabetes Brother   . Hypertension Brother   . Heart disease Maternal Aunt        x 2 did 2012 CAD-CHF  . Diabetes Brother   . Hypertension Brother   . Lung cancer Maternal Aunt   . Breast cancer Neg Hx   . Esophageal cancer Neg Hx   . Stomach cancer Neg Hx     Social History   Tobacco Use  . Smoking status: Never Smoker  . Smokeless tobacco: Never Used  Vaping Use  . Vaping Use: Never used  Substance Use Topics  . Alcohol use: No  . Drug use: No    Home Medications Prior to Admission medications   Medication Sig Start Date End Date Taking? Authorizing Provider  atorvastatin (LIPITOR) 20 MG tablet Take 1 tablet (20 mg total) by mouth daily at 6 PM. 01/31/20   Colon Branch, MD  baclofen (LIORESAL) 10 MG tablet Take 1 tablet (10 mg total) by mouth 3 (three) times daily. Take 3 tablets(30 mg total) three times daily. 11/01/20   Jamse Arn, MD  buPROPion (WELLBUTRIN) 100 MG tablet Take 1 tablet (100 mg total) by mouth 2 (two) times daily. 12/09/20   Colon Branch, MD  carvedilol (COREG) 6.25 MG tablet Take 1 tablet (6.25 mg total) by mouth 2 (two) times daily with a meal. 10/21/20   Colon Branch, MD  clopidogrel (PLAVIX) 75 MG tablet Take 1 tablet (75 mg total) by mouth daily. 05/28/20   Colon Branch, MD  Cyanocobalamin (VITAMIN B 12 PO) Take 1 tablet by mouth daily.    [provider]  escitalopram (LEXAPRO) 20 MG tablet Take 1 tablet (20 mg total) by mouth daily. 10/21/20   Colon Branch, MD  furosemide (LASIX) 20 MG tablet Take 1 tablet (20 mg total) by mouth daily. 08/12/20   Colon Branch, MD  gabapentin (NEURONTIN) 300 MG capsule TAKE 1 CAPSULE BY MOUTH THREE TIMES A DAY 12/24/20   Jamse Arn, MD  lisinopril (ZESTRIL) 2.5 MG tablet Take 1 tablet (2.5 mg total) by mouth daily. 12/09/20   Colon Branch, MD  Vitamin D, Ergocalciferol, (DRISDOL) 1.25 MG (50000 UNIT) CAPS capsule Take 1 capsule (50,000 Units total) by  mouth every 7 (seven) days. 11/21/20   Colon Branch, MD  zaleplon (SONATA) 10 MG capsule TAKE 1 CAPSULE (10 MG TOTAL) BY MOUTH AT BEDTIME AS NEEDED FOR SLEEP. 12/11/20   Colon Branch, MD  Allergies    Bee venom, Peanut-containing drug products, Hydrocodone, Isovue [iopamidol], Ambien [zolpidem tartrate], Aspirin, and Latex  Review of Systems   Review of Systems  Constitutional: Negative for chills and fever.  HENT: Positive for congestion. Negative for ear pain and sore throat.   Respiratory: Positive for cough. Negative for shortness of breath.   Cardiovascular: Positive for chest pain (w/ coughing only). Negative for leg swelling.  Gastrointestinal: Negative for abdominal pain, diarrhea, nausea and vomiting.  Neurological: Negative for syncope.       Positive for L sided hemiplegia.   All other systems reviewed and are negative.   Physical Exam Updated Vital Signs BP 92/67   Pulse 78   Temp 99.1 F (37.3 C)   Resp 18   Ht 4\' 11"  (1.499 m)   Wt 74.4 kg   SpO2 95%   BMI 33.12 kg/m   Physical Exam Vitals and nursing note reviewed.  Constitutional:      General: She is not in acute distress.    Appearance: She is well-developed.  HENT:     Head: Normocephalic and atraumatic.     Right Ear: Ear canal normal. Tympanic membrane is not perforated, erythematous, retracted or bulging.     Left Ear: Ear canal normal. Tympanic membrane is not perforated, erythematous, retracted or bulging.     Ears:     Comments: No mastoid erythema/swelling/tenderness.     Nose: Congestion present.     Right Sinus: No maxillary sinus tenderness or frontal sinus tenderness.     Left Sinus: No maxillary sinus tenderness or frontal sinus tenderness.     Mouth/Throat:     Pharynx: Uvula midline. No oropharyngeal exudate or posterior oropharyngeal erythema.     Comments: Posterior oropharynx is symmetric appearing. Patient tolerating own secretions without difficulty. No trismus. No drooling. No hot  potato voice. No swelling beneath the tongue, submandibular compartment is soft.  Eyes:     General:        Right eye: No discharge.        Left eye: No discharge.     Conjunctiva/sclera: Conjunctivae normal.     Pupils: Pupils are equal, round, and reactive to light.  Cardiovascular:     Rate and Rhythm: Normal rate and regular rhythm.     Heart sounds: No murmur heard.   Pulmonary:     Effort: Pulmonary effort is normal. No respiratory distress.     Breath sounds: Normal breath sounds. No wheezing, rhonchi or rales.  Chest:     Chest wall: Tenderness (anterior central chest wall- reproduces pain. ) present.  Abdominal:     General: There is no distension.     Palpations: Abdomen is soft.     Tenderness: There is no abdominal tenderness.  Musculoskeletal:     Cervical back: Normal range of motion and neck supple. No edema or rigidity.  Lymphadenopathy:     Cervical: No cervical adenopathy.  Skin:    General: Skin is warm and dry.     Findings: No rash.  Neurological:     Mental Status: She is alert.  Psychiatric:        Behavior: Behavior normal.     ED Results / Procedures / Treatments   Labs (all labs ordered are listed, but only abnormal results are displayed) Labs Reviewed  RAPID INFLUENZA A&B ANTIGENS  SARS CORONAVIRUS 2 (TAT 6-24 HRS)    EKG None  Radiology DG Chest Portable 1 View  Result Date: 01/06/2021 CLINICAL  DATA:  Cough EXAM: PORTABLE CHEST 1 VIEW COMPARISON:  08/30/2020 FINDINGS: Mild cardiomegaly. Both lungs are clear. The visualized skeletal structures are unremarkable. IMPRESSION: Mild cardiomegaly without acute abnormality of the lungs in AP portable projection. Electronically Signed   By: Eddie Candle M.D.   On: 01/06/2021 20:41    Procedures Procedures   Medications Ordered in ED Medications  acetaminophen (TYLENOL) tablet 650 mg (650 mg Oral Given 01/06/21 1447)    ED Course  I have reviewed the triage vital signs and the nursing  notes.  Pertinent labs & imaging results that were available during my care of the patient were reviewed by me and considered in my medical decision making (see chart for details).  Abigail Campos was evaluated in Emergency Department on 01/06/2021 for the symptoms described in the history of present illness. He/she was evaluated in the context of the global COVID-19 pandemic, which necessitated consideration that the patient might be at risk for infection with the SARS-CoV-2 virus that causes COVID-19. Institutional protocols and algorithms that pertain to the evaluation of patients at risk for COVID-19 are in a state of rapid change based on information released by regulatory bodies including the CDC and federal and state organizations. These policies and algorithms were followed during the patient's care in the ED.    MDM Rules/Calculators/A&P                         Patient presents to the ED with complaints of cold sxs x 1 week.  Nontoxic, initially febrile- resolved following anti-pyretics in the ED.   Additional history obtained:  Additional history obtained from chart review & nursing note review.   Lab Tests:  I Ordered, reviewed, and interpreted labs, which included:  Influenza testing: negative COVID testing: Pending  Imaging Studies ordered:  I ordered imaging studies which included CXR, I independently visualized and interpreted imaging which showed mild cardiomegaly w/o acute abnormality.   ED Course:  Exam is without signs of AOM, AOE, or mastoiditis. Oropharyngeal exam is benign. No sinus tenderness. No meningeal signs. Lungs are CTA without focal adventitious sounds, no signs of increased work of breathing, CXR without infiltrate, doubt CAP. Abdomen nontender w/o peritoneal signs. No increased work of breathing or respiratory distress. Overall suspect viral at this time, covid testing pending, will provide supportive care w/ PCP follow up. I discussed results, treatment  plan, need for follow-up, and return precautions with the patient. Provided opportunity for questions, patient confirmed understanding and is in agreement with plan.   Portions of this note were generated with [employer]. Dictation errors may occur despite best attempts at proofreading.   Final Clinical Impression(s) / ED Diagnoses Final diagnoses:  Cough    Rx / DC Orders ED Discharge Orders         Ordered    benzonatate (TESSALON) 100 MG capsule  Every 8 hours        01/06/21 2049           Amaryllis Dyke, PA-C 01/06/21 2050    Little, Wenda Overland, MD 01/08/21 1220

## 2021-01-07 ENCOUNTER — Telehealth: Payer: Medicare Other | Admitting: Internal Medicine

## 2021-01-07 ENCOUNTER — Telehealth: Payer: Self-pay | Admitting: Internal Medicine

## 2021-01-07 LAB — SARS CORONAVIRUS 2 (TAT 6-24 HRS): SARS Coronavirus 2: POSITIVE — AB

## 2021-01-07 NOTE — Telephone Encounter (Signed)
Tried calling Pt, voicemail box is full.

## 2021-01-07 NOTE — Telephone Encounter (Signed)
Patient went to the ER with respiratory symptoms, symptoms started 1 week prior to the visit, she was discharged home, I just noticed that her Covid test is positive.  (Per protocol, onset of symptoms needs to be less than 5 days to qualify for treatment) Advise patient: Need to treat this as a virus with rest, fluids, Mucinex DM, Tylenol. Could see her visually if not gradually better. ER if severe, sudden worsening of symptoms.

## 2021-01-09 ENCOUNTER — Other Ambulatory Visit: Payer: Self-pay

## 2021-01-09 ENCOUNTER — Telehealth (INDEPENDENT_AMBULATORY_CARE_PROVIDER_SITE_OTHER): Payer: No Typology Code available for payment source | Admitting: Family Medicine

## 2021-01-09 ENCOUNTER — Encounter (INDEPENDENT_AMBULATORY_CARE_PROVIDER_SITE_OTHER): Payer: Self-pay | Admitting: Family Medicine

## 2021-01-09 ENCOUNTER — Encounter: Payer: Self-pay | Admitting: Internal Medicine

## 2021-01-09 DIAGNOSIS — Z8673 Personal history of transient ischemic attack (TIA), and cerebral infarction without residual deficits: Secondary | ICD-10-CM | POA: Diagnosis not present

## 2021-01-09 DIAGNOSIS — E66811 Obesity, class 1: Secondary | ICD-10-CM

## 2021-01-09 DIAGNOSIS — E669 Obesity, unspecified: Secondary | ICD-10-CM | POA: Diagnosis not present

## 2021-01-09 DIAGNOSIS — R7401 Elevation of levels of liver transaminase levels: Secondary | ICD-10-CM

## 2021-01-09 DIAGNOSIS — U071 COVID-19: Secondary | ICD-10-CM | POA: Diagnosis not present

## 2021-01-09 DIAGNOSIS — Z6833 Body mass index (BMI) 33.0-33.9, adult: Secondary | ICD-10-CM

## 2021-01-09 NOTE — Telephone Encounter (Signed)
See Pt's mychart message

## 2021-01-11 ENCOUNTER — Encounter: Payer: Self-pay | Admitting: Internal Medicine

## 2021-01-12 ENCOUNTER — Emergency Department (HOSPITAL_BASED_OUTPATIENT_CLINIC_OR_DEPARTMENT_OTHER): Payer: No Typology Code available for payment source

## 2021-01-12 ENCOUNTER — Other Ambulatory Visit: Payer: Self-pay

## 2021-01-12 ENCOUNTER — Emergency Department (HOSPITAL_BASED_OUTPATIENT_CLINIC_OR_DEPARTMENT_OTHER)
Admission: EM | Admit: 2021-01-12 | Discharge: 2021-01-12 | Disposition: A | Discharge status: Home / Self Care | Payer: No Typology Code available for payment source | Attending: Emergency Medicine | Admitting: Emergency Medicine

## 2021-01-12 ENCOUNTER — Encounter (HOSPITAL_BASED_OUTPATIENT_CLINIC_OR_DEPARTMENT_OTHER): Payer: Self-pay | Admitting: Emergency Medicine

## 2021-01-12 DIAGNOSIS — Z7902 Long term (current) use of antithrombotics/antiplatelets: Secondary | ICD-10-CM | POA: Diagnosis not present

## 2021-01-12 DIAGNOSIS — J1282 Pneumonia due to coronavirus disease 2019: Secondary | ICD-10-CM | POA: Diagnosis not present

## 2021-01-12 DIAGNOSIS — Z79899 Other long term (current) drug therapy: Secondary | ICD-10-CM | POA: Insufficient documentation

## 2021-01-12 DIAGNOSIS — Z91010 Allergy to peanuts: Secondary | ICD-10-CM | POA: Diagnosis not present

## 2021-01-12 DIAGNOSIS — I517 Cardiomegaly: Secondary | ICD-10-CM | POA: Diagnosis not present

## 2021-01-12 DIAGNOSIS — U071 COVID-19: Secondary | ICD-10-CM

## 2021-01-12 DIAGNOSIS — Z91040 Latex allergy status: Secondary | ICD-10-CM | POA: Insufficient documentation

## 2021-01-12 DIAGNOSIS — Z8543 Personal history of malignant neoplasm of ovary: Secondary | ICD-10-CM | POA: Insufficient documentation

## 2021-01-12 DIAGNOSIS — I10 Essential (primary) hypertension: Secondary | ICD-10-CM | POA: Insufficient documentation

## 2021-01-12 DIAGNOSIS — R079 Chest pain, unspecified: Secondary | ICD-10-CM | POA: Diagnosis not present

## 2021-01-12 LAB — BASIC METABOLIC PANEL
Anion gap: 10 (ref 5–15)
BUN: 13 mg/dL (ref 6–20)
CO2: 22 mmol/L (ref 22–32)
Calcium: 8.3 mg/dL — ABNORMAL LOW (ref 8.9–10.3)
Chloride: 100 mmol/L (ref 98–111)
Creatinine, Ser: 1.19 mg/dL — ABNORMAL HIGH (ref 0.44–1.00)
GFR, Estimated: 57 mL/min — ABNORMAL LOW (ref >60)
Glucose, Bld: 112 mg/dL — ABNORMAL HIGH (ref 70–99)
Potassium: 3.8 mmol/L (ref 3.5–5.1)
Sodium: 132 mmol/L — ABNORMAL LOW (ref 135–145)

## 2021-01-12 LAB — CBC
HCT: 39.8 % (ref 36.0–46.0)
Hemoglobin: 13.2 g/dL (ref 12.0–15.0)
MCH: 30.6 pg (ref 26.0–34.0)
MCHC: 33.2 g/dL (ref 30.0–36.0)
MCV: 92.3 fL (ref 80.0–100.0)
Platelets: 128 10*3/uL — ABNORMAL LOW (ref 150–400)
RBC: 4.31 MIL/uL (ref 3.87–5.11)
RDW: 13.3 % (ref 11.5–15.5)
WBC: 3.8 10*3/uL — ABNORMAL LOW (ref 4.0–10.5)
nRBC: 0 % (ref 0.0–0.2)

## 2021-01-12 LAB — TROPONIN I (HIGH SENSITIVITY): Troponin I (High Sensitivity): 4 ng/L (ref <18)

## 2021-01-12 IMAGING — DX DG CHEST 1V PORT
1 series · 1 of 1 positions shown · non-contrast
Comparison: [DATE]

CLINICAL DATA: Chest pain.  COVID positive.

EXAM:
PORTABLE CHEST 1 VIEW

[chest ap]
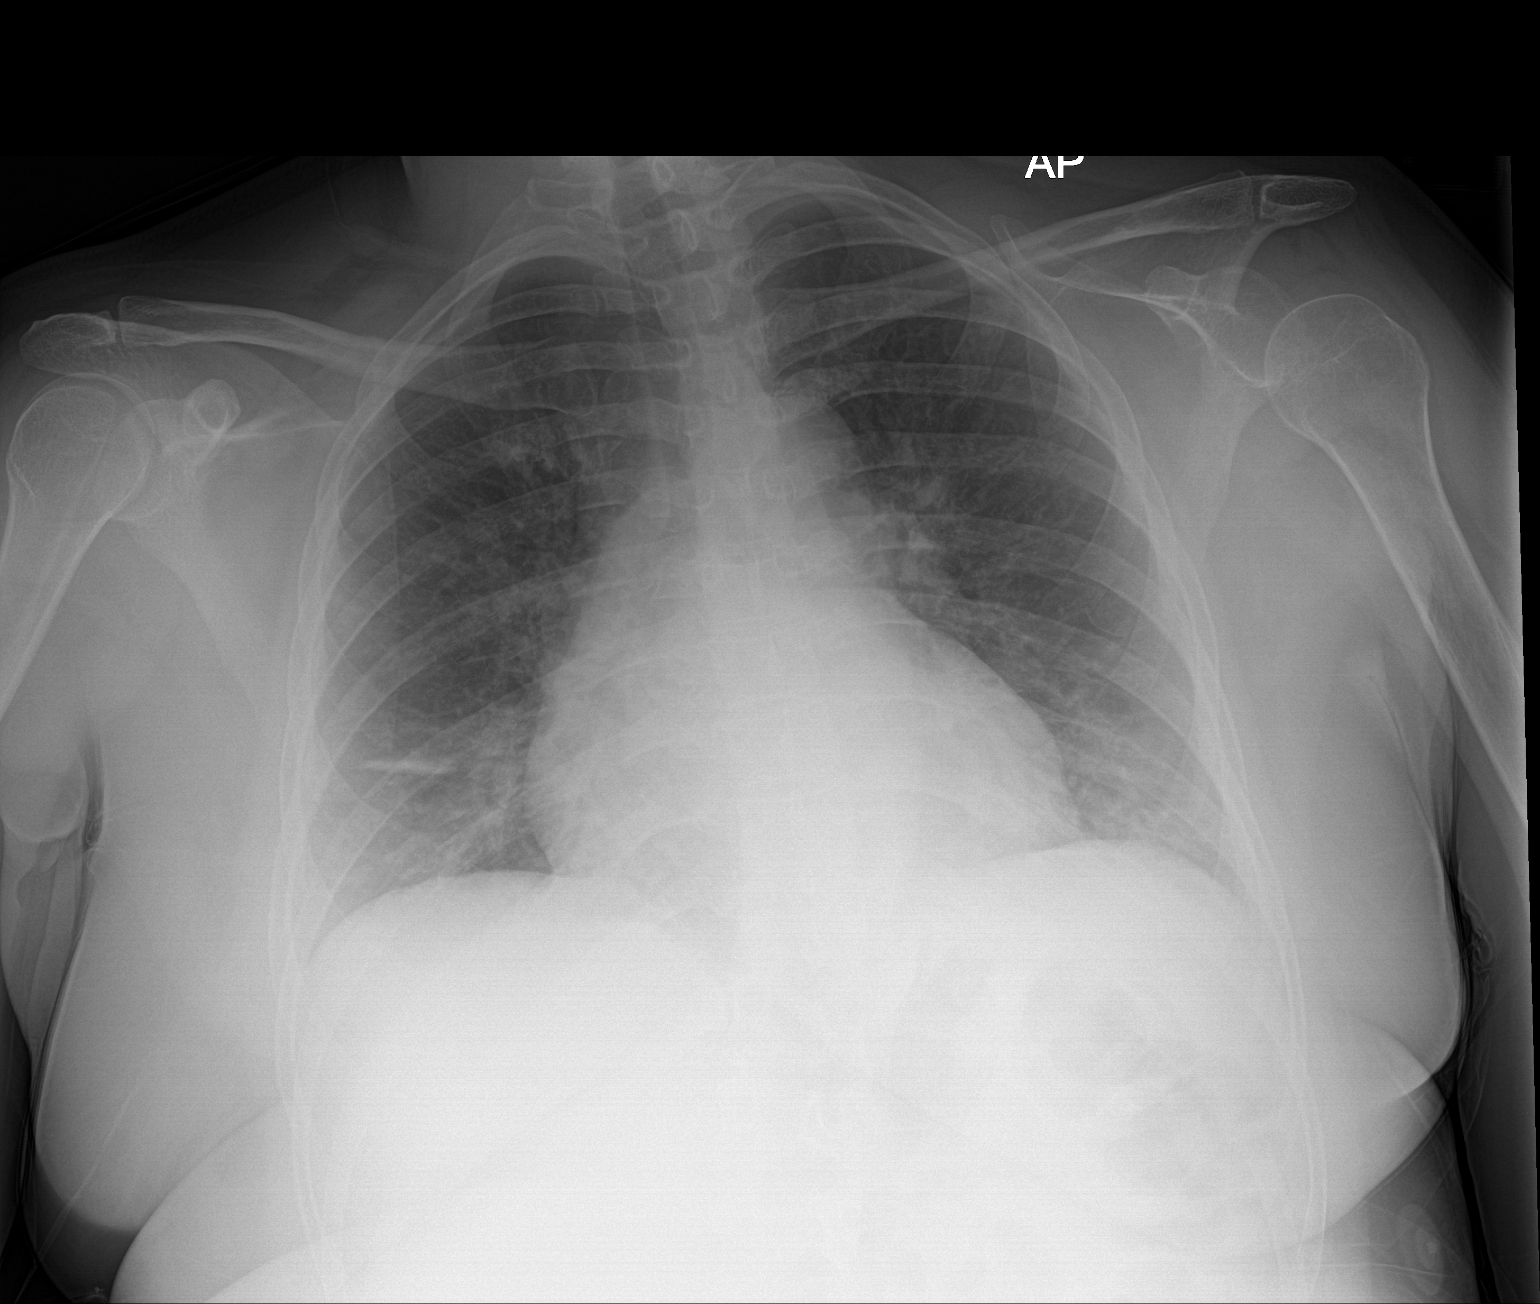

[1 of 1 positions shown; findings below may reference images not displayed]

FINDINGS: Lower lung volumes from prior exam. Mild cardiomegaly is similar.
Developing patchy opacities at both lung bases and right perihilar
region. No pneumothorax or evidence of pneumomediastinum. No pleural
fluid. No acute osseous abnormalities.
IMPRESSION: Developing patchy opacities in both lung bases and right perihilar
region, pattern consistent with [0R] pneumonia.

## 2021-01-12 MED ORDER — ACETAMINOPHEN 325 MG PO TABS: 650.0000 mg | ORAL_TABLET | Freq: Once | ORAL | Status: AC

## 2021-01-12 MED ORDER — ACETAMINOPHEN 325 MG PO TABS
ORAL_TABLET | ORAL | Status: AC
  Administered 2021-01-12: 650 mg via ORAL
  Filled 2021-01-12: qty 2

## 2021-01-12 NOTE — ED Provider Notes (Signed)
[facility] Provider Note   [name]: [id number] Arrival date & time: 01/12/21  1947     History Chief Complaint  Patient presents with  . Covid Positive    Abigail Campos is a 50 y.o. female.  Patient is a 50 year old female with a history of hypertension, hyperlipidemia, left-sided hemiplegia from prior stroke who presents with chest pain related to Covid symptoms.  She said that she started having symptoms about a week ago on January 24.  She has had some coughing and myalgias with some loose stools.  No nausea or vomiting.  For the last 2 days she is felt some pain in the center of her chest.  She says its mostly with coughing.  She does not report that is worse with deep breathing.  There is no leg pain or swelling.  She has had some intermittent fevers.  She was having some myalgias but says those have improved.  She does not report any significant shortness of breath.  She has had 2 vaccines for Covid but has not had her booster.        Past Medical History:  Diagnosis Date  . Anemia   . Anxiety and depression   . Back pain   . Essential hypertension 10/19/2007   03-2010: metoprolol changed to bystolic (was not feeling well on it, no specific allergy or reaction)    . Food allergy   . Hemiplegia ([place])   . Hyperlipemia   . Hypertension   . Joint pain   . Migraine   . Neuromuscular disorder ([place])    left sided hemiplegia - has brace for l leg and walks with a cane  . Obesity   . Ovarian cancer ([place])   . Prediabetes   . Sleep apnea    not wearing c-pap yet  . Sleep apnea   . Stroke ([place]) 2017  . Vitamin D deficiency     Patient Active Problem List   Diagnosis Date Noted  . Neuropathic pain 09/19/2020  . Adjustment disorder with mixed anxiety and depressed mood 09/19/2020  . Facial droop 08/30/2020  . Abnormality of gait 05/09/2020  . Hemiplegia of nondominant side, late effect of cerebrovascular disease ([place]) 03/28/2020  . Pleural  effusion 03/01/2020  . Sleep disturbance 02/06/2020  . Spastic hemiplegia of left nondominant side due to nontraumatic intraparenchymal hemorrhage of brain ([place]) 12/26/2019  . Trigger thumb of right hand 11/02/2019  . Obstructive sleep apnea 07/26/2019  . Spastic hemiplegia of left nondominant side as late effect of nontraumatic intraparenchymal hemorrhage of brain ([place]) 04/06/2019  . Spastic hemiparesis affecting dominant side ([place]) 01/04/2018  . Hyperglycemia 12/29/2016  . Anxiety and depression 12/29/2016  . Muscle spasticity   . Obesity (BMI 30.0-34.9) 04/08/2016  . Hyperlipidemia 04/08/2016  . Basal ganglia hemorrhage ([place]) 04/08/2016  . Dysphagia as late effect of cerebrovascular disease   . Migraine with aura and without status migrainosus, not intractable   . Gait disturbance, post-stroke   . Hemiplegia, post-stroke ([place])   . Aphasia, post-stroke   . Dysarthria, post-stroke   . ICH (intracerebral hemorrhage) (HCC) - R basal ganglia due to hypertensive emergency 04/01/2016  . Upper airway cough syndrome 12/06/2015  . PCP NOTES >>>>>>>>>>>>>>>>>>>>>>>>>>>.. 09/25/2015  . Insomnia 12/28/2012  . Annual physical exam 11/23/2011  . Essential hypertension 10/19/2007    Past Surgical History:  Procedure Laterality Date  . ABDOMINAL HYSTERECTOMY  11-14-07   no oophorectomy per surgical report  . CESAREAN SECTION  [place]:[id number]  . INGUINAL HERNIA REPAIR     2004     OB History    Gravida  2   Para  2   Term  2   Preterm      AB      Living  2     SAB      IAB      Ectopic      Multiple      Live Births  2           Family History  Problem Relation Age of Onset  . Diabetes Mother   . Hypertension Mother   . Glaucoma Mother   . Colon cancer Mother        mother age 62  . Cancer Mother   . Anxiety disorder Mother   . Obesity Mother   . Rectal cancer Father        dx 55  . Hypertension Father   . Cancer Father   . Sleep apnea Father   .  Alcoholism Father   . Obesity Father   . Stroke Paternal Grandfather   . Stroke Maternal Grandfather   . Diabetes Brother   . Hypertension Brother   . Heart disease Maternal Aunt        x 2 did 2012 CAD-CHF  . Diabetes Brother   . Hypertension Brother   . Lung cancer Maternal Aunt   . Breast cancer Neg Hx   . Esophageal cancer Neg Hx   . Stomach cancer Neg Hx     Social History   Tobacco Use  . Smoking status: Never Smoker  . Smokeless tobacco: Never Used  Vaping Use  . Vaping Use: Never used  Substance Use Topics  . Alcohol use: No  . Drug use: No    Home Medications Prior to Admission medications   Medication Sig Start Date End Date Taking? Authorizing Provider  atorvastatin (LIPITOR) 20 MG tablet Take 1 tablet (20 mg total) by mouth daily at 6 PM. 01/31/20   Colon Branch, MD  baclofen (LIORESAL) 10 MG tablet Take 1 tablet (10 mg total) by mouth 3 (three) times daily. Take 3 tablets(30 mg total) three times daily. 11/01/20   Jamse Arn, MD  benzonatate (TESSALON) 100 MG capsule Take 1 capsule (100 mg total) by mouth every 8 (eight) hours. 01/06/21   Petrucelli, Samantha R, PA-C  buPROPion (WELLBUTRIN) 100 MG tablet Take 1 tablet (100 mg total) by mouth 2 (two) times daily. 12/09/20   Colon Branch, MD  carvedilol (COREG) 6.25 MG tablet Take 1 tablet (6.25 mg total) by mouth 2 (two) times daily with a meal. 10/21/20   Colon Branch, MD  clopidogrel (PLAVIX) 75 MG tablet Take 1 tablet (75 mg total) by mouth daily. 05/28/20   Colon Branch, MD  Cyanocobalamin (VITAMIN B 12 PO) Take 1 tablet by mouth daily.    [provider]  escitalopram (LEXAPRO) 20 MG tablet Take 1 tablet (20 mg total) by mouth daily. 10/21/20   Colon Branch, MD  furosemide (LASIX) 20 MG tablet Take 1 tablet (20 mg total) by mouth daily. 08/12/20   Colon Branch, MD  gabapentin (NEURONTIN) 300 MG capsule TAKE 1 CAPSULE BY MOUTH THREE TIMES A DAY 12/24/20   Jamse Arn, MD  lisinopril (ZESTRIL) 2.5 MG  tablet Take 1 tablet (2.5 mg total) by mouth daily. 12/09/20   Colon Branch, MD  Vitamin D, Ergocalciferol, (DRISDOL) 1.25  MG (50000 UNIT) CAPS capsule Take 1 capsule (50,000 Units total) by mouth every 7 (seven) days. 11/21/20   Colon Branch, MD  zaleplon (SONATA) 10 MG capsule TAKE 1 CAPSULE (10 MG TOTAL) BY MOUTH AT BEDTIME AS NEEDED FOR SLEEP. 12/11/20   Colon Branch, MD    Allergies    Bee venom, Peanut-containing drug products, Hydrocodone, Isovue [iopamidol], Ambien [zolpidem tartrate], Aspirin, and Latex  Review of Systems   Review of Systems  Constitutional: Positive for fatigue and fever. Negative for chills and diaphoresis.  HENT: Negative for congestion, rhinorrhea and sneezing.   Eyes: Negative.   Respiratory: Positive for cough and chest tightness. Negative for shortness of breath.   Cardiovascular: Positive for chest pain. Negative for leg swelling.  Gastrointestinal: Positive for diarrhea. Negative for abdominal pain, blood in stool, nausea and vomiting.  Genitourinary: Negative for difficulty urinating, flank pain, frequency and hematuria.  Musculoskeletal: Positive for myalgias. Negative for arthralgias and back pain.  Skin: Negative for rash.  Neurological: Negative for dizziness, speech difficulty, weakness, numbness and headaches.    Physical Exam Updated Vital Signs BP 105/72   Pulse 88   Temp 100 F (37.8 C)   Resp 20   Wt 73 kg   SpO2 95%   BMI 32.51 kg/m   Physical Exam Constitutional:      Appearance: She is well-developed and well-nourished.  HENT:     Head: Normocephalic and atraumatic.  Eyes:     Pupils: Pupils are equal, round, and reactive to light.  Cardiovascular:     Rate and Rhythm: Normal rate and regular rhythm.     Heart sounds: Normal heart sounds.  Pulmonary:     Effort: Pulmonary effort is normal. No respiratory distress.     Breath sounds: Normal breath sounds. No wheezing or rales.  Chest:     Chest wall: Tenderness (Positive  tenderness on palpation over sternum) present.  Abdominal:     General: Bowel sounds are normal.     Palpations: Abdomen is soft.     Tenderness: There is no abdominal tenderness. There is no guarding or rebound.  Musculoskeletal:        General: No edema. Normal range of motion.     Cervical back: Normal range of motion and neck supple.     Comments: No edema or calf tenderness  Lymphadenopathy:     Cervical: No cervical adenopathy.  Skin:    General: Skin is warm and dry.     Findings: No rash.  Neurological:     Mental Status: She is alert and oriented to person, place, and time.  Psychiatric:        Mood and Affect: Mood and affect normal.     ED Results / Procedures / Treatments   Labs (all labs ordered are listed, but only abnormal results are displayed) Labs Reviewed  BASIC METABOLIC PANEL - Abnormal; Notable for the following components:      Result Value   Sodium 132 (*)    Glucose, Bld 112 (*)    Creatinine, Ser 1.19 (*)    Calcium 8.3 (*)    GFR, Estimated 57 (*)    All other components within normal limits  CBC - Abnormal; Notable for the following components:   WBC 3.8 (*)    Platelets 128 (*)    All other components within normal limits  TROPONIN I (HIGH SENSITIVITY)    EKG EKG Interpretation  Date/Time:  Sunday January 12 2021 19:52:06 EST  Ventricular Rate:  87 PR Interval:  136 QRS Duration: 84 QT Interval:  382 QTC Calculation: 459 R Axis:   -36 Text Interpretation: Normal sinus rhythm Left axis deviation Abnormal ECG since last tracing no significant change Confirmed by Malvin Johns [phone]) on 01/12/2021 8:06:26 PM   Radiology DG Chest Portable 1 View  Result Date: 01/12/2021 CLINICAL DATA:  Chest pain.  COVID positive. EXAM: PORTABLE CHEST 1 VIEW COMPARISON:  01/06/2021 FINDINGS: Lower lung volumes from prior exam. Mild cardiomegaly is similar. Developing patchy opacities at both lung bases and right perihilar region. No pneumothorax or  evidence of pneumomediastinum. No pleural fluid. No acute osseous abnormalities. IMPRESSION: Developing patchy opacities in both lung bases and right perihilar region, pattern consistent with COVID-19 pneumonia. Electronically Signed   By: Keith Rake M.D.   On: 01/12/2021 21:22    Procedures Procedures   Medications Ordered in ED Medications  acetaminophen (TYLENOL) tablet 650 mg (650 mg Oral Given 01/12/21 2003)    ED Course  I have reviewed the triage vital signs and the nursing notes.  Pertinent labs & imaging results that were available during my care of the patient were reviewed by me and considered in my medical decision making (see chart for details).    MDM Rules/Calculators/A&P                          Patient presents with some chest pain in conjunction with Covid 19 virus infection.  Her chest x-ray shows findings consistent with Covid.  Her labs are nonconcerning.  Her creatinine is mildly elevated from her baseline value.  She does not have symptoms that would be more concerning for PE.  No pleuritic pain.  No unilateral leg swelling.  No hypoxia.  No persistent tachycardia.  She overall is well-appearing.  I feel like her pain is likely musculoskeletal from the coughing.  She does not have any symptoms that sound more suggestive for ACS.  Her EKG does not show any ischemic changes.  Her troponin is negative.  Given her consistency of chest pain, I did not feel that a repeat troponin was indicated.  She was discharged home in good condition.  Return precautions were given.  Symptomatic care instructions were given.  Loan Bonitz Salsbury was evaluated in Emergency Department on 01/12/2021 for the symptoms described in the history of present illness. She was evaluated in the context of the global COVID-19 pandemic, which necessitated consideration that the patient might be at risk for infection with the SARS-CoV-2 virus that causes COVID-19. Institutional protocols and algorithms  that pertain to the evaluation of patients at risk for COVID-19 are in a state of rapid change based on information released by regulatory bodies including the CDC and federal and state organizations. These policies and algorithms were followed during the patient's care in the ED.  Final Clinical Impression(s) / ED Diagnoses Final diagnoses:  Pneumonia due to COVID-19 virus    Rx / DC Orders ED Discharge Orders    None       Malvin Johns, MD 01/12/21 2148

## 2021-01-12 NOTE — Discharge Instructions (Signed)
Your kidney function was slightly out of the normal range.  Make sure that you are drinking fluids at home and once your symptoms have resolved, you should follow-up with your primary care doctor to have this rechecked.

## 2021-01-12 NOTE — ED Triage Notes (Signed)
Covid positive, reports feeling pressure "like something's sitting on my chest" since Saturday."

## 2021-01-13 NOTE — Telephone Encounter (Signed)
Pt seen in ED over weekend.

## 2021-01-13 NOTE — Progress Notes (Signed)
TeleHealth Visit:  Due to the COVID-19 pandemic, this visit was completed with telemedicine (audio/video) technology to reduce patient and provider exposure as well as to preserve personal protective equipment.   Abigail Campos has verbally consented to this TeleHealth visit. The patient is located at home, the provider is located at the [facility] office. The participants in this visit include the listed provider and patient and. The visit was conducted today via [place].  OBESITY Abigail Campos is here to discuss her progress with her obesity treatment plan along with follow-up of her obesity related diagnoses.   Today's visit was #: 2 Starting weight: 164 lbs Starting date: 12/26/2020 Today's date: 01/09/2021  Interim History: Abigail Campos currently has COVID.  She says she is trying to stay hydrated. Nutrition Plan: the Category 1 Plan for 90% of the time.  Activity: None at this time.  Assessment/Plan:   1. Elevated ALT measurement Elevated liver transaminases with an ALT predominance combined with obesity and insulin resistance is characteristic, but not diagnostic of non-alcoholic fatty liver disease (NAFLD). NAFLD is the 2nd leading cause of liver transplant in adults. Treatment includes weight loss, elimination of sweet drinks, including juice, avoidance of high fructose corn syrup, and exercise. As always, avoiding alcohol consumption is important.  Lab Results  Component Value Date   ALT 34 (H) 12/26/2020   AST 27 12/26/2020   ALKPHOS 103 12/26/2020   BILITOT 0.4 12/26/2020   2. COVID We will continue to monitor. We will continue to monitor symptoms as they relate to her weight loss journey.  3. History of CVA (cerebrovascular accident) Abigail Campos is at high risk for complication. The patient understands monitoring parameters and red flags.   4. Class 1 obesity with serious comorbidity and body mass index (BMI) of 33.0 to 33.9 in adult, unspecified obesity type  Course: Abigail Campos  is currently in the action stage of change. As such, her goal is to continue with weight loss efforts.   Nutrition goals: She has agreed to focus on hydration and eat as tolerated.  Exercise goals: No exercise has been prescribed at this time.  Behavioral modification strategies: increasing water intake.  Abigail Campos has agreed to follow-up with our clinic in 2 weeks. She was informed of the importance of frequent follow-up visits to maximize her success with intensive lifestyle modifications for her multiple health conditions.   Objective:   There were no vitals taken for this visit. There is no height or weight on file to calculate BMI.  General: Cooperative, alert, well developed, in no acute distress. HEENT: Conjunctivae and lids unremarkable. Cardiovascular: Regular rhythm.  Lungs: Normal work of breathing. Neurologic: No focal deficits.   Lab Results  Component Value Date   CREATININE 1.19 (H) 01/12/2021   BUN 13 01/12/2021   NA 132 (L) 01/12/2021   K 3.8 01/12/2021   CL 100 01/12/2021   CO2 22 01/12/2021   Lab Results  Component Value Date   ALT 34 (H) 12/26/2020   AST 27 12/26/2020   ALKPHOS 103 12/26/2020   BILITOT 0.4 12/26/2020   Lab Results  Component Value Date   HGBA1C 6.0 11/19/2020   HGBA1C 6.1 (H) 08/31/2020   HGBA1C 5.7 02/14/2020   HGBA1C 6.0 07/12/2019   HGBA1C 6.2 02/27/2019   Lab Results  Component Value Date   TSH 1.310 12/26/2020   Lab Results  Component Value Date   CHOL 118 12/26/2020   HDL 45 12/26/2020   LDLCALC 61 12/26/2020   TRIG 55  12/26/2020   CHOLHDL 2.6 12/26/2020   Lab Results  Component Value Date   WBC 3.8 (L) 01/12/2021   HGB 13.2 01/12/2021   HCT 39.8 01/12/2021   MCV 92.3 01/12/2021   PLT 128 (L) 01/12/2021   Lab Results  Component Value Date   IRON 97 12/26/2020   TIBC 313 12/26/2020   FERRITIN 72 12/26/2020   Attestation Statements:   Reviewed by clinician on day of visit: allergies, medications, problem  list, medical history, surgical history, family history, social history, and previous encounter notes.  I, Water quality scientist, CMA, am acting as transcriptionist for Briscoe Deutscher, DO  I have reviewed the above documentation for accuracy and completeness, and I agree with the above. Briscoe Deutscher, DO

## 2021-01-16 ENCOUNTER — Encounter: Payer: Self-pay | Admitting: Internal Medicine

## 2021-01-17 ENCOUNTER — Telehealth (INDEPENDENT_AMBULATORY_CARE_PROVIDER_SITE_OTHER): Payer: No Typology Code available for payment source | Admitting: Internal Medicine

## 2021-01-17 ENCOUNTER — Telehealth: Payer: Self-pay | Admitting: Internal Medicine

## 2021-01-17 ENCOUNTER — Encounter: Payer: Self-pay | Admitting: Internal Medicine

## 2021-01-17 VITALS — BP 124/70 | Temp 101.0°F | Ht 59.0 in | Wt 164.0 lb

## 2021-01-17 DIAGNOSIS — U071 COVID-19: Secondary | ICD-10-CM | POA: Diagnosis not present

## 2021-01-17 DIAGNOSIS — R059 Cough, unspecified: Secondary | ICD-10-CM | POA: Diagnosis not present

## 2021-01-17 DIAGNOSIS — K047 Periapical abscess without sinus: Secondary | ICD-10-CM | POA: Diagnosis not present

## 2021-01-17 MED ORDER — AMOXICILLIN-POT CLAVULANATE 875-125 MG PO TABS: 1.0000 | ORAL_TABLET | Freq: Two times a day (BID) | ORAL | 0 refills | Status: DC

## 2021-01-17 NOTE — Telephone Encounter (Signed)
See patient's message, please give me more information about what she needs, seems like she has a dental infection. If severe symptoms such as fever, chills, difficulty swallowing or breathing: ER Otherwise let me know

## 2021-01-17 NOTE — Progress Notes (Signed)
Subjective:    Patient ID: Abigail Campos, female    DOB: [birth year], 50 y.o.   MRN: [id number]  DOS:  01/17/2021 Type of visit - description: Virtual Visit via Video Note  I connected with the above patient  by a video enabled telemedicine application and verified that I am speaking with the correct person using two identifiers.   THIS ENCOUNTER IS A VIRTUAL VISIT DUE TO COVID-19 - PATIENT WAS NOT SEEN IN THE OFFICE. PATIENT HAS CONSENTED TO VIRTUAL VISIT / TELEMEDICINE VISIT   Location of patient: home  Location of provider: office  Persons participating in the virtual visit: patient, provider   I discussed the limitations of evaluation and management by telemedicine and the availability of in person appointments. The patient expressed understanding and agreed to proceed.  Acute Started with respiratory symptoms approximately 01/06/2021.  Cough, myalgias, loose stools.  Went to the ER, Covid test came back +, portable chest x-ray okay.  Prescribed benzonatate. Went to the ER 01/12/2021. Chest x-ray: Developing patchy opacities in both lungs consistent with COVID-19 pneumonia, she was well-appearing, and was discharged home.  I set up this visit because she called requesting antibiotics.  Overall she feels that she was feeling better regards her COVID symptoms except for a persistent cough with clear sputum production. 5 days ago right upper tooth "cracked" and since then she is having a lot of pain, she called her dentist and because she was recently diagnosed with COVID they could not see her. I asked her to self palpate the gum and she reported no swelling. Denies any difficulty breathing or swallowing.  She did said that he is having fevers on and off, not daily.  Temperature today 101.  Denies chest pain other than when she cough No shortness of breath No nausea or vomiting Good p.o. intake and good urinary output.   Review of Systems See above   Past Medical  History:  Diagnosis Date  . Anemia   . Anxiety and depression   . Back pain   . Essential hypertension 10/19/2007   03-2010: metoprolol changed to bystolic (was not feeling well on it, no specific allergy or reaction)    . Food allergy   . Hemiplegia ([place])   . Hyperlipemia   . Hypertension   . Joint pain   . Migraine   . Neuromuscular disorder ([place])    left sided hemiplegia - has brace for l leg and walks with a cane  . Obesity   . Ovarian cancer ([place])   . Prediabetes   . Sleep apnea    not wearing c-pap yet  . Sleep apnea   . Stroke ([place]) 2017  . Vitamin D deficiency     Past Surgical History:  Procedure Laterality Date  . ABDOMINAL HYSTERECTOMY  11-14-07   no oophorectomy per surgical report  . CESAREAN SECTION     [id number]  . INGUINAL HERNIA REPAIR     2004    Allergies as of 01/17/2021      Reactions   Bee Venom Anaphylaxis   Peanut-containing Drug Products Anaphylaxis   Hydrocodone Hives   Generalize itching w/o rash   Isovue [iopamidol] Hives, Itching   Pt broke out with one facial hive.  Had itching on her right upper ant and posterior arm w/o evidence of hives.  Pt given water and 25mg  benadryl po.  We observed pt for 30 minutes before d/c.  J. Bohm     Ambien [zolpidem Tartrate] Other (  See Comments)   forgetfulness   Aspirin Swelling   Reports blisters w/ ASA but pt reports is ok w/ Motrin, Advil, naproxen   Latex Hives      Medication List       Accurate as of January 17, 2021 11:59 PM. If you have any questions, ask your nurse or doctor.        amoxicillin-clavulanate 875-125 MG tablet Commonly known as: Augmentin Take 1 tablet by mouth 2 (two) times daily. Started by: Kathlene November, MD   atorvastatin 20 MG tablet Commonly known as: LIPITOR Take 1 tablet (20 mg total) by mouth daily at 6 PM.   baclofen 10 MG tablet Commonly known as: LIORESAL Take 1 tablet (10 mg total) by mouth 3 (three) times daily. Take 3 tablets(30 mg total) three times  daily.   benzonatate 100 MG capsule Commonly known as: TESSALON Take 1 capsule (100 mg total) by mouth every 8 (eight) hours.   buPROPion 100 MG tablet Commonly known as: WELLBUTRIN Take 1 tablet (100 mg total) by mouth 2 (two) times daily.   carvedilol 6.25 MG tablet Commonly known as: COREG Take 1 tablet (6.25 mg total) by mouth 2 (two) times daily with a meal.   clopidogrel 75 MG tablet Commonly known as: PLAVIX Take 1 tablet (75 mg total) by mouth daily.   escitalopram 20 MG tablet Commonly known as: LEXAPRO Take 1 tablet (20 mg total) by mouth daily.   furosemide 20 MG tablet Commonly known as: LASIX Take 1 tablet (20 mg total) by mouth daily.   gabapentin 300 MG capsule Commonly known as: NEURONTIN TAKE 1 CAPSULE BY MOUTH THREE TIMES A DAY   lisinopril 2.5 MG tablet Commonly known as: ZESTRIL Take 1 tablet (2.5 mg total) by mouth daily.   VITAMIN B 12 PO Take 1 tablet by mouth daily.   Vitamin D (Ergocalciferol) 1.25 MG (50000 UNIT) Caps capsule Commonly known as: DRISDOL Take 1 capsule (50,000 Units total) by mouth every 7 (seven) days.   zaleplon 10 MG capsule Commonly known as: SONATA TAKE 1 CAPSULE (10 MG TOTAL) BY MOUTH AT BEDTIME AS NEEDED FOR SLEEP.          Objective:   Physical Exam BP 124/70   Temp (!) 101 F (38.3 C) (Oral)   Ht 4\' 11"  (1.499 m)   Wt 164 lb (74.4 kg)   BMI 33.12 kg/m  The patient is alert oriented x3. At rest face is symmetric, when I asked her to open her mouth and smile she does have left facial weakness from previous stroke. No obvious neck swelling, no drooling, speech is at baseline, she is walking around her house without apparent problem. I did notice some cough and chest congestion.     Assessment     Assessment   Prediabetes: A1c 6.0 (April 2017)  HTN Hyperlipidemia Depression, insomnia GERD Stroke 03-2016: [place] (L)  spastic hemiplegia   ICH, right basilar ganglia due to HTN emergency, +  encephalopathy, + dysphagia, aphasia, dysarthria Residual L spasticity.  CTA neck 03-2016 neg CTA head 10-17 (-) aneurysm Headaches , migraines dx after 2nd pregnancy, (-) CT head 2014 and 2015 Cough, persisting, saw Dr. Melvyn Novas 11-2015 OSA   PLAN: COVID-19: Symptoms a started 01/06/2021, seen at the ER twice, the last time was 01/12/2021, chest x-ray consistent with Covid pneumonia. Overall she said that regards COVID she is feeling better except for persisting cough, I noted some large airway chest congestion.  She is still running  fevers on and off and now apparently she has a dental infection. Plan: Augmentin x10 days to cover the dental infection and possible pneumonia. Rest, fluids, go to the ER if severe symptoms, difficulty swallowing, difficulty breathing. Tylenol as needed for pain See the dentist as soon as possible Recommend office visit in person here at this office next week as long as she is afebrile. Dental infection?  As above  I discussed the assessment and treatment plan with the patient. The patient was provided an opportunity to ask questions and all were answered. The patient agreed with the plan and demonstrated an understanding of the instructions.   The patient was advised to call back or seek an in-person evaluation if the symptoms worsen or if the condition fails to improve as anticipated.

## 2021-01-17 NOTE — Telephone Encounter (Signed)
Called pt and will add her to todays schedule at 11:40 ok per Dr Larose Kells. -JMA

## 2021-01-19 NOTE — Assessment & Plan Note (Signed)
COVID-19: Symptoms a started 01/06/2021, seen at the ER twice, the last time was 01/12/2021, chest x-ray consistent with Covid pneumonia. Overall she said that regards COVID she is feeling better except for persisting cough, I noted some large airway chest congestion.  She is still running fevers on and off and now apparently she has a dental infection. Plan: Augmentin x10 days to cover the dental infection and possible pneumonia. Rest, fluids, go to the ER if severe symptoms, difficulty swallowing, difficulty breathing. Tylenol as needed for pain See the dentist as soon as possible Recommend office visit in person here at this office next week as long as she is afebrile. Dental infection?  As above

## 2021-01-22 ENCOUNTER — Encounter: Payer: Self-pay | Admitting: Internal Medicine

## 2021-01-22 ENCOUNTER — Other Ambulatory Visit: Payer: Self-pay

## 2021-01-22 ENCOUNTER — Ambulatory Visit (INDEPENDENT_AMBULATORY_CARE_PROVIDER_SITE_OTHER): Payer: No Typology Code available for payment source | Admitting: Internal Medicine

## 2021-01-22 VITALS — BP 123/80 | HR 80 | Temp 98.1°F | Resp 18 | Ht 59.0 in | Wt 157.5 lb

## 2021-01-22 DIAGNOSIS — B370 Candidal stomatitis: Secondary | ICD-10-CM | POA: Diagnosis not present

## 2021-01-22 DIAGNOSIS — N760 Acute vaginitis: Secondary | ICD-10-CM | POA: Diagnosis not present

## 2021-01-22 DIAGNOSIS — J1282 Pneumonia due to coronavirus disease 2019: Secondary | ICD-10-CM | POA: Diagnosis not present

## 2021-01-22 DIAGNOSIS — K047 Periapical abscess without sinus: Secondary | ICD-10-CM

## 2021-01-22 DIAGNOSIS — U071 COVID-19: Secondary | ICD-10-CM

## 2021-01-22 LAB — BASIC METABOLIC PANEL
BUN: 14 mg/dL (ref 6–23)
CO2: 31 mEq/L (ref 19–32)
Calcium: 9.6 mg/dL (ref 8.4–10.5)
Chloride: 101 mEq/L (ref 96–112)
Creatinine, Ser: 0.96 mg/dL (ref 0.40–1.20)
GFR: 70.6 mL/min (ref >60.00)
Glucose, Bld: 95 mg/dL (ref 70–99)
Potassium: 4.1 mEq/L (ref 3.5–5.1)
Sodium: 139 mEq/L (ref 135–145)

## 2021-01-22 MED ORDER — FLUCONAZOLE 150 MG PO TABS: 150.0000 mg | ORAL_TABLET | Freq: Every day | ORAL | 0 refills | Status: DC

## 2021-01-22 MED ORDER — BENZONATATE 100 MG PO CAPS: 100.0000 mg | ORAL_CAPSULE | Freq: Three times a day (TID) | ORAL | 0 refills | Status: DC

## 2021-01-22 MED ORDER — NYSTATIN 100000 UNIT/ML MT SUSP: 5.0000 mL | Freq: Four times a day (QID) | OROMUCOSAL | 0 refills | Status: DC

## 2021-01-22 NOTE — Progress Notes (Signed)
Subjective:    Patient ID: Abigail Campos, female    DOB: [birth year], 50 y.o.   MRN: [id number]  DOS:  01/22/2021 Type of visit - description: Follow-up  See last visit, she was diagnosed with COVID pneumonia, was running fevers and also developed dental infection. Was Rx  Augmentin.  Fever chills stopped. Still coughing occasional w/ whitish sputum. No chest pain or difficulty breathing Still has DOE when she walks more than 30 or 40 steps. Has also developed thrush and vaginal yeast infection.  Dental pain is no worse, still hurts on and off.  No swelling noted by the patient  Complaining of thrush as well.  Review of Systems See above   Past Medical History:  Diagnosis Date  . Anemia   . Anxiety and depression   . Back pain   . Essential hypertension 10/19/2007   03-2010: metoprolol changed to bystolic (was not feeling well on it, no specific allergy or reaction)    . Food allergy   . Hemiplegia ([place])   . Hyperlipemia   . Hypertension   . Joint pain   . Migraine   . Neuromuscular disorder ([place])    left sided hemiplegia - has brace for l leg and walks with a cane  . Obesity   . Ovarian cancer ([place])   . Prediabetes   . Sleep apnea    not wearing c-pap yet  . Sleep apnea   . Stroke ([place]) 2017  . Vitamin D deficiency     Past Surgical History:  Procedure Laterality Date  . ABDOMINAL HYSTERECTOMY  11-14-07   no oophorectomy per surgical report  . CESAREAN SECTION     [id number]  . INGUINAL HERNIA REPAIR     2004    Allergies as of 01/22/2021      Reactions   Bee Venom Anaphylaxis   Peanut-containing Drug Products Anaphylaxis   Hydrocodone Hives   Generalize itching w/o rash   Isovue [iopamidol] Hives, Itching   Pt broke out with one facial hive.  Had itching on her right upper ant and posterior arm w/o evidence of hives.  Pt given water and 25mg  benadryl po.  We observed pt for 30 minutes before d/c.  J. Bohm     Ambien [zolpidem Tartrate] Other (See  Comments)   forgetfulness   Aspirin Swelling   Reports blisters w/ ASA but pt reports is ok w/ Motrin, Advil, naproxen   Latex Hives      Medication List       Accurate as of January 22, 2021 11:59 PM. If you have any questions, ask your nurse or doctor.        amoxicillin-clavulanate 875-125 MG tablet Commonly known as: Augmentin Take 1 tablet by mouth 2 (two) times daily.   atorvastatin 20 MG tablet Commonly known as: LIPITOR Take 1 tablet (20 mg total) by mouth daily at 6 PM.   baclofen 10 MG tablet Commonly known as: LIORESAL Take 1 tablet (10 mg total) by mouth 3 (three) times daily. Take 3 tablets(30 mg total) three times daily.   benzonatate 100 MG capsule Commonly known as: TESSALON Take 1 capsule (100 mg total) by mouth every 8 (eight) hours.   buPROPion 100 MG tablet Commonly known as: WELLBUTRIN Take 1 tablet (100 mg total) by mouth 2 (two) times daily.   carvedilol 6.25 MG tablet Commonly known as: COREG Take 1 tablet (6.25 mg total) by mouth 2 (two) times daily with a meal.   clopidogrel  75 MG tablet Commonly known as: PLAVIX Take 1 tablet (75 mg total) by mouth daily.   escitalopram 20 MG tablet Commonly known as: LEXAPRO Take 1 tablet (20 mg total) by mouth daily.   fluconazole 150 MG tablet Commonly known as: DIFLUCAN Take 1 tablet (150 mg total) by mouth daily. For 2 days Started by: Kathlene November, MD   furosemide 20 MG tablet Commonly known as: LASIX Take 1 tablet (20 mg total) by mouth daily.   gabapentin 300 MG capsule Commonly known as: NEURONTIN TAKE 1 CAPSULE BY MOUTH THREE TIMES A DAY   lisinopril 2.5 MG tablet Commonly known as: ZESTRIL Take 1 tablet (2.5 mg total) by mouth daily.   nystatin 100000 UNIT/ML suspension Commonly known as: MYCOSTATIN Take 5 mLs (500,000 Units total) by mouth 4 (four) times daily. Swish and spit. For 5 days Started by: Kathlene November, MD   VITAMIN B 12 PO Take 1 tablet by mouth daily.   Vitamin D  (Ergocalciferol) 1.25 MG (50000 UNIT) Caps capsule Commonly known as: DRISDOL Take 1 capsule (50,000 Units total) by mouth every 7 (seven) days.   zaleplon 10 MG capsule Commonly known as: SONATA TAKE 1 CAPSULE (10 MG TOTAL) BY MOUTH AT BEDTIME AS NEEDED FOR SLEEP.          Objective:   Physical Exam BP 123/80 (BP Location: Right Arm, Patient Position: Sitting, Cuff Size: Small)   Pulse 80   Temp 98.1 F (36.7 C) (Oral)   Resp 18   Ht 4\' 11"  (1.499 m)   Wt 157 lb 8 oz (71.4 kg)   SpO2 96%   BMI 31.81 kg/m  General:   Well developed, NAD, BMI noted. HEENT:  Normocephalic . Face symmetric, atraumatic. Tongue: Few white patches noted. Poor dentition, slightly tender with no swelling at the right upper gum, where she has no teeth Lungs:  CTA B Normal respiratory effort, no intercostal retractions, no accessory muscle use. Heart: RRR,  no murmur.  Lower extremities: no pretibial edema bilaterally  Skin: Not pale. Not jaundice Neurologic:  alert & oriented X3.  Sequela from the stroke seems stable with left hemiplegia Psych--  Cognition and judgment appear intact.  Cooperative with normal attention span and concentration.  Behavior appropriate. No anxious or depressed appearing.      Assessment     Assessment   Prediabetes: A1c 6.0 (April 2017)  HTN Hyperlipidemia Depression, insomnia GERD Stroke 03-2016: Sequela (L)  spastic hemiplegia   ICH, right basilar ganglia due to HTN emergency, + encephalopathy, + dysphagia, aphasia, dysarthria Residual L spasticity.  CTA neck 03-2016 neg CTA head 10-17 (-) aneurysm Headaches , migraines dx after 2nd pregnancy, (-) CT head 2014 and 2015 Cough, persisting, saw Dr. Melvyn Novas 11-2015 OSA   PLAN: COVID-19 pneumonia: See last visit, was seen at the ER 01/12/2021, chest x-ray show Covid pneumonia, sodium was a slightly low, creatinine  slightly up. She is on Augmentin now mostly to cover a dental infection. She is feeling  better, with no fever chills, still DOE. Plan: Continue good hydration, check BMP, chest x-ray in 2 weeks. Dental infection: On Augmentin, on exam she is TTP at the gum but no swelling, rec to see her dentist ASAP. Thrush, vaginal yeast infection (as reported by patient): New issue, nystatin and Monistat OTC for 5 days.  After she finished antibiotics take Diflucan.  Call if no better.     This visit occurred during the SARS-CoV-2 public health emergency.  Safety protocols were  in place, including screening questions prior to the visit, additional usage of staff PPE, and extensive cleaning of exam room while observing appropriate contact time as indicated for disinfecting solutions.

## 2021-01-22 NOTE — Progress Notes (Signed)
Pre visit review using our clinic review tool, if applicable. No additional management support is needed unless otherwise documented below in the visit note. 

## 2021-01-22 NOTE — Patient Instructions (Signed)
Start nystatin, swish and spit 4 times a day for the next 5 days.  Start using Monistat OTC for the vaginal yeast infection  When you finish antibiotic, take Diflucan 1 tablet daily for 2 days.    GO TO THE LAB : Get the blood work     In 2 weeks, come back to the first floor of the building and get your chest x-ray done.

## 2021-01-23 ENCOUNTER — Other Ambulatory Visit: Payer: Self-pay

## 2021-01-23 ENCOUNTER — Encounter
Payer: No Typology Code available for payment source | Attending: Physical Medicine & Rehabilitation | Admitting: Physical Medicine & Rehabilitation

## 2021-01-23 ENCOUNTER — Encounter: Payer: Self-pay | Admitting: Physical Medicine & Rehabilitation

## 2021-01-23 VITALS — BP 106/71 | HR 87 | Temp 98.9°F | Ht 59.0 in | Wt 151.0 lb

## 2021-01-23 DIAGNOSIS — I69154 Hemiplegia and hemiparesis following nontraumatic intracerebral hemorrhage affecting left non-dominant side: Secondary | ICD-10-CM | POA: Diagnosis not present

## 2021-01-23 NOTE — Progress Notes (Signed)
Dysport: Procedure Note Patient Name: Abigail Campos DOB: April 09, 1971 MRN: [id number] Date: 01/23/21  Procedure: Botulinum toxin administration Guidance: EMG Diagnosis: Spastic left nondominant IPH Attending: Delice Lesch, MD   Informed consent: Risks, benefits & options of the procedure are explained to the patient (and/or family). The patient elects to proceed with procedure. Risks include but are not limited to weakness, respiratory distress, dry mouth, ptosis, antibody formation, worsening of some areas of function. Benefits include decreased abnormal muscle tone, improved hygiene and positioning, decreased skin breakdown and, in some cases, decreased pain. Options include conservative management with oral antispasticity agents, phenol chemodenervation of nerve or at motor nerve branches. More invasive options include intrathecal balcofen adminstration for appropriate candidates. Surgical options may include tendon lengthening or transposition or, rarely, dorsal rhizotomy.   History/Physical Examination: 50 y.o.  Year old right-handed female with history of hypertension, migraine headaches presents for follow up after right basal ganglia hemorrhage.   Modified [place]: Left 1/4 elbow flexors, 2/4 wrist flexors, 3/4 left finger flexors, 1/4 ankle plantar flexors.  Previous Treatments: Therapy/Range of motion Indication for guidance: Target active muscules  Procedure: Botulinum toxin was mixed with preservative free saline with a dilution of 0.5cc to 100 units. Targeted limb and muscles were identified. The skin was prepped with alcohol swabs and placement of needle tip in targeted muscle was confirmed using appropriate guidance. Prior to injection, positioning of needle tip outside of blood vessel was determined by pulling back on syringe plunger.  MUSCLE UNITS  Left Med biceps: 100 units Left FCR: 100 units Left FCU: 100 units Left FDS: 450 units Left FDP: 450 units  Left Med  gastroc: 150 units   Left Lat gastroc: 150 units   Total units injected: 1324  Complications: None  Plan: RTC 6 weeks Kaleiah Kutzer Anil Dominique Calvey 10:53 AM

## 2021-01-23 NOTE — Assessment & Plan Note (Signed)
COVID-19 pneumonia: See last visit, was seen at the ER 01/12/2021, chest x-ray show Covid pneumonia, sodium was a slightly low, creatinine  slightly up. She is on Augmentin now mostly to cover a dental infection. She is feeling better, with no fever chills, still DOE. Plan: Continue good hydration, check BMP, chest x-ray in 2 weeks. Dental infection: On Augmentin, on exam she is TTP at the gum but no swelling, rec to see her dentist ASAP. Thrush, vaginal yeast infection (as reported by patient): New issue, nystatin and Monistat OTC for 5 days.  After she finished antibiotics take Diflucan.  Call if no better.

## 2021-01-27 ENCOUNTER — Encounter (INDEPENDENT_AMBULATORY_CARE_PROVIDER_SITE_OTHER): Payer: Self-pay | Admitting: Family Medicine

## 2021-01-27 ENCOUNTER — Other Ambulatory Visit: Payer: Self-pay

## 2021-01-27 ENCOUNTER — Ambulatory Visit (INDEPENDENT_AMBULATORY_CARE_PROVIDER_SITE_OTHER): Payer: No Typology Code available for payment source | Admitting: Family Medicine

## 2021-01-27 VITALS — BP 105/68 | HR 91 | Temp 97.5°F | Ht 59.0 in | Wt 156.0 lb

## 2021-01-27 DIAGNOSIS — Z6831 Body mass index (BMI) 31.0-31.9, adult: Secondary | ICD-10-CM

## 2021-01-27 DIAGNOSIS — R7303 Prediabetes: Secondary | ICD-10-CM

## 2021-01-27 DIAGNOSIS — E559 Vitamin D deficiency, unspecified: Secondary | ICD-10-CM | POA: Diagnosis not present

## 2021-01-27 DIAGNOSIS — U071 COVID-19: Secondary | ICD-10-CM

## 2021-01-27 DIAGNOSIS — E8881 Metabolic syndrome: Secondary | ICD-10-CM | POA: Diagnosis not present

## 2021-01-27 DIAGNOSIS — Z9189 Other specified personal risk factors, not elsewhere classified: Secondary | ICD-10-CM | POA: Diagnosis not present

## 2021-01-27 DIAGNOSIS — E669 Obesity, unspecified: Secondary | ICD-10-CM

## 2021-01-27 MED ORDER — OZEMPIC (0.25 OR 0.5 MG/DOSE) 2 MG/1.5ML ~~LOC~~ SOPN: 0.2500 mg | PEN_INJECTOR | SUBCUTANEOUS | 0 refills | Status: DC

## 2021-01-30 NOTE — Progress Notes (Signed)
Chief Complaint:   OBESITY Abigail Campos is here to discuss her progress with her obesity treatment plan along with follow-up of her obesity related diagnoses.   Today's visit was #: 3 Starting weight: 164 lbs Starting date: 12/26/2020 Today's weight: 156 lbs Today's date: 01/27/2021 Total lbs lost to date: 8 lbs Body mass index is 31.51 kg/m.  Total weight loss percentage to date: -4.88%  Interim History: [name] has been eating chicken, [place], fruits, and vegetables.  Nutrition Plan: Hydration and eating as tolerated for 100% of the time. Activity: None at this time  Assessment/Plan:   1. Prediabetes Not at goal. Goal is HgbA1c < 5.7.  Medication: None.    Plan:  She will continue to focus on protein-rich, low simple carbohydrate foods. We reviewed the importance of hydration, regular exercise for stress reduction, and restorative sleep.   Lab Results  Component Value Date   HGBA1C 6.0 11/19/2020   2. Vitamin D deficiency Not at goal. Current vitamin D is 17.70, tested on 11/19/2020. Optimal goal > 50 ng/dL.  Plan: Continue to take prescription Vitamin D @50 ,000 IU every week as prescribed.  Follow-up for routine testing of Vitamin D, at least 2-3 times per year to avoid over-replacement.  3. COVID-19 With pneumonia.  Resolving.  She is taking Augmentin.  We will continue to monitor.   4. Metabolic syndrome Starting goal: Lose 7-10% of starting weight. She will continue to focus on protein-rich, low simple carbohydrate foods. We reviewed the importance of hydration, regular exercise for stress reduction, and restorative sleep.  We will continue to check lab work every 3 months, with 10% weight loss, or should any other concerns arise.  - Start Semaglutide,0.25 or 0.5MG /DOS, (OZEMPIC, 0.25 OR 0.5 MG/DOSE,) 2 MG/1.5ML SOPN; Inject 0.25 mg into the skin once a week.  Dispense: 1.5 mL; Refill: 0  5. At risk for activity intolerance [name] was given approximately 8 minutes of  counseling today regarding her increased risk for exercise intolerance.  We discussed patient's specific personal and medical issues that raise our concern.  She was advised of strategies to prevent injury and ways to improve her cardiopulmonary fitness levels slowly over time.  We additionally discussed various fitness trackers and smart phone apps to help motivate the patient to stay on track.   6. Class 1 obesity with serious comorbidity and body mass index (BMI) of 31.0 to 31.9 in adult, unspecified obesity type  Course: [name] is currently in the action stage of change. As such, her goal is to continue with weight loss efforts.   Nutrition goals: She has agreed to following a lower carbohydrate, vegetable and lean protein rich diet plan.   Exercise goals: No exercise has been prescribed at this time.  Behavioral modification strategies: increasing lean protein intake, decreasing simple carbohydrates, increasing vegetables and increasing water intake.  Almeter has agreed to follow-up with our clinic in 2 weeks. She was informed of the importance of frequent follow-up visits to maximize her success with intensive lifestyle modifications for her multiple health conditions.   Objective:   Blood pressure 105/68, pulse 91, temperature (!) 97.5 F (36.4 C), temperature source Oral, height 4\' 11"  (1.499 m), weight 156 lb (70.8 kg), SpO2 94 %. Body mass index is 31.51 kg/m.  General: Cooperative, alert, well developed, in no acute distress. HEENT: Conjunctivae and lids unremarkable. Cardiovascular: Regular rhythm.  Lungs: Normal work of breathing. Neurologic: No focal deficits.   Lab Results  Component Value Date   CREATININE  0.96 01/22/2021   BUN 14 01/22/2021   NA 139 01/22/2021   K 4.1 01/22/2021   CL 101 01/22/2021   CO2 31 01/22/2021   Lab Results  Component Value Date   ALT 34 (H) 12/26/2020   AST 27 12/26/2020   ALKPHOS 103 12/26/2020   BILITOT 0.4 12/26/2020   Lab Results   Component Value Date   HGBA1C 6.0 11/19/2020   HGBA1C 6.1 (H) 08/31/2020   HGBA1C 5.7 02/14/2020   HGBA1C 6.0 07/12/2019   HGBA1C 6.2 02/27/2019   Lab Results  Component Value Date   TSH 1.310 12/26/2020   Lab Results  Component Value Date   CHOL 118 12/26/2020   HDL 45 12/26/2020   LDLCALC 61 12/26/2020   TRIG 55 12/26/2020   CHOLHDL 2.6 12/26/2020   Lab Results  Component Value Date   WBC 3.8 (L) 01/12/2021   HGB 13.2 01/12/2021   HCT 39.8 01/12/2021   MCV 92.3 01/12/2021   PLT 128 (L) 01/12/2021   Lab Results  Component Value Date   IRON 97 12/26/2020   TIBC 313 12/26/2020   FERRITIN 72 12/26/2020   Attestation Statements:   Reviewed by clinician on day of visit: allergies, medications, problem list, medical history, surgical history, family history, social history, and previous encounter notes.  I, Water quality scientist, CMA, am acting as transcriptionist for Briscoe Deutscher, DO  I have reviewed the above documentation for accuracy and completeness, and I agree with the above. Briscoe Deutscher, DO

## 2021-02-04 ENCOUNTER — Encounter (INDEPENDENT_AMBULATORY_CARE_PROVIDER_SITE_OTHER): Payer: Self-pay | Admitting: Family Medicine

## 2021-02-05 ENCOUNTER — Other Ambulatory Visit: Payer: Self-pay | Admitting: Internal Medicine

## 2021-02-05 MED ORDER — ZALEPLON 10 MG PO CAPS: 10.0000 mg | ORAL_CAPSULE | Freq: Every evening | ORAL | 1 refills | Status: DC | PRN

## 2021-02-07 ENCOUNTER — Other Ambulatory Visit: Payer: Self-pay

## 2021-02-07 ENCOUNTER — Ambulatory Visit (INDEPENDENT_AMBULATORY_CARE_PROVIDER_SITE_OTHER): Payer: No Typology Code available for payment source

## 2021-02-07 ENCOUNTER — Ambulatory Visit
Admission: RE | Admit: 2021-02-07 | Discharge: 2021-02-07 | Disposition: A | Discharge status: Home / Self Care | Payer: No Typology Code available for payment source | Source: Ambulatory Visit | Attending: Family Medicine | Admitting: Family Medicine

## 2021-02-07 VITALS — BP 117/83 | HR 79 | Temp 98.8°F | Resp 18

## 2021-02-07 DIAGNOSIS — W19XXXA Unspecified fall, initial encounter: Secondary | ICD-10-CM

## 2021-02-07 DIAGNOSIS — M79672 Pain in left foot: Secondary | ICD-10-CM

## 2021-02-07 DIAGNOSIS — M7989 Other specified soft tissue disorders: Secondary | ICD-10-CM | POA: Diagnosis not present

## 2021-02-07 DIAGNOSIS — M7732 Calcaneal spur, left foot: Secondary | ICD-10-CM | POA: Diagnosis not present

## 2021-02-07 DIAGNOSIS — R2242 Localized swelling, mass and lump, left lower limb: Secondary | ICD-10-CM

## 2021-02-07 IMAGING — DX DG FOOT COMPLETE 3+V*L*
3 series · 3 of 3 positions shown · non-contrast
Comparison: [DATE]

CLINICAL DATA: Pain and swelling following fall

EXAM:
LEFT FOOT - COMPLETE 3+ VIEW

[foot supine dp]
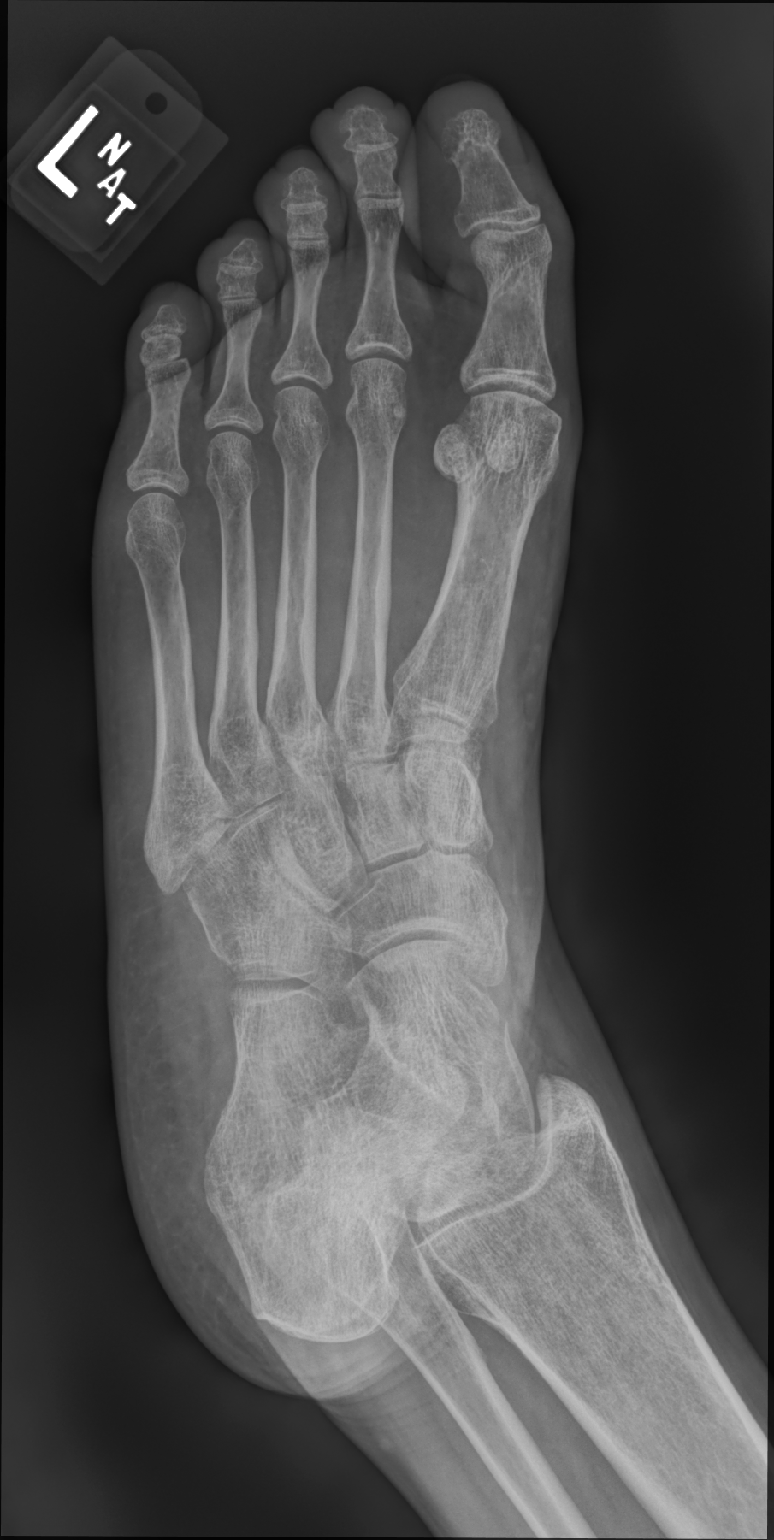

[foot medial oblique]
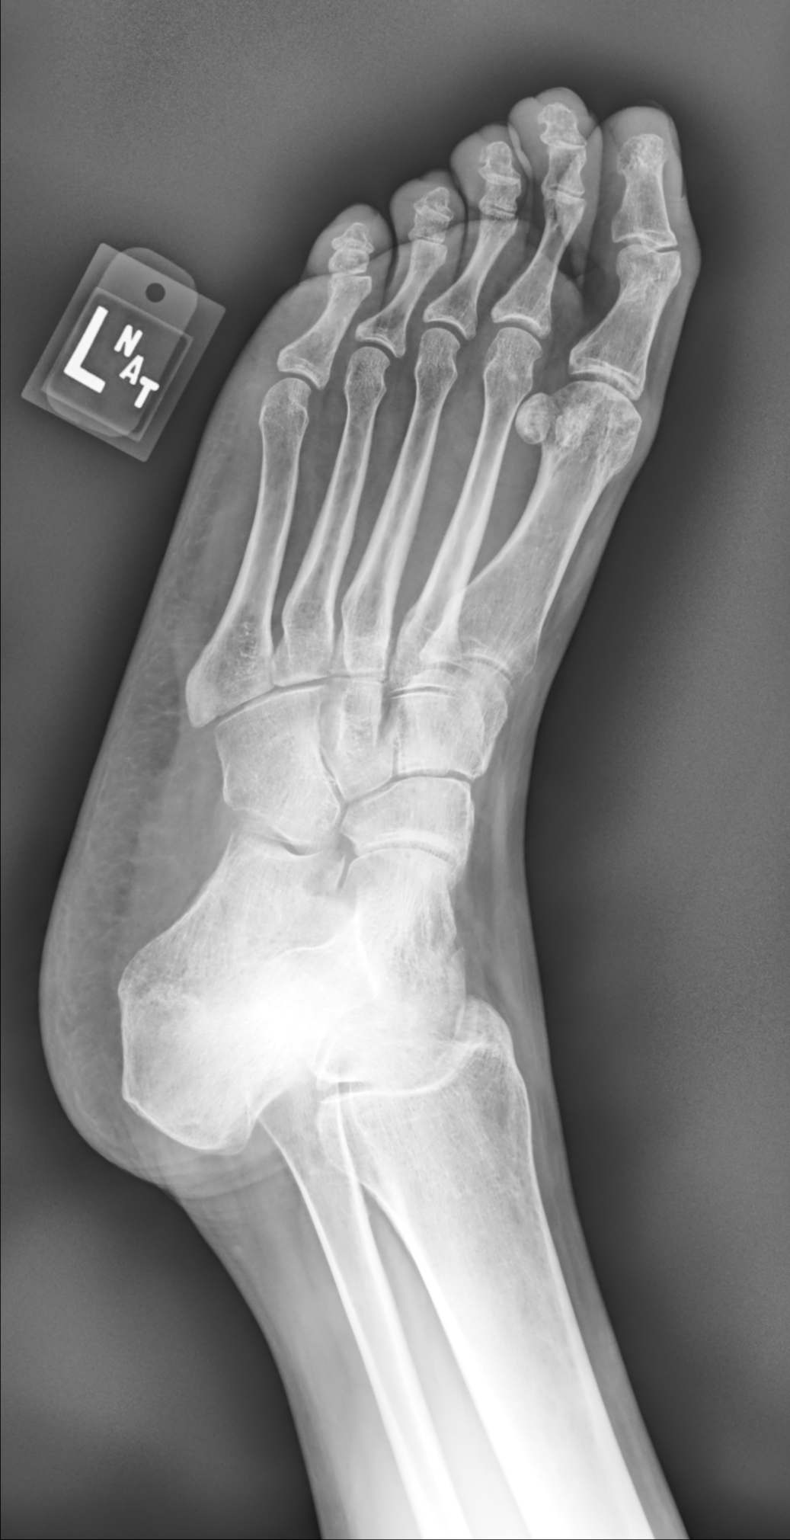

[foot supine lat]
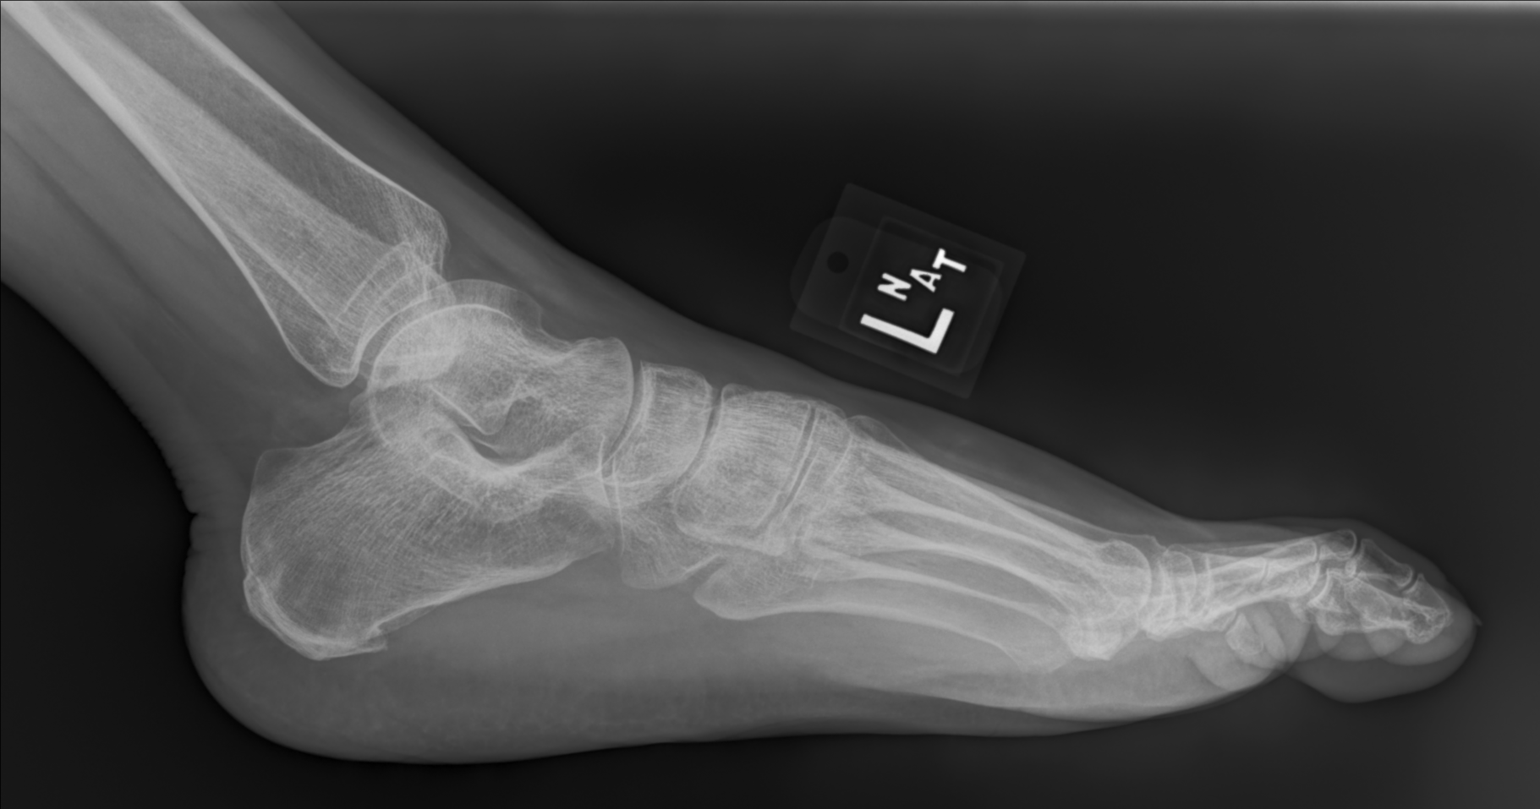

[3 of 3 positions shown; findings below may reference images not displayed]

FINDINGS: Frontal, oblique, and lateral views obtained. Localized osteopenia
in the lateral aspect of the distal most portion of the first distal
phalanx is a stable finding. No fracture or dislocation. No erosive
change. There is slight narrowing of the first MTP joint. Other
joint spaces appear normal. There is a minimal inferior calcaneal
spur. No erosive change.
IMPRESSION: Chronic osteopenia in the lateral aspect of the distal portion of
the first distal phalanx. No overt bony destruction. No similar
changes elsewhere. Slight narrowing first MTP joint. Other joint
spaces appear normal. No fracture or dislocation. Minimal inferior
calcaneal spur noted.

## 2021-02-07 MED ORDER — ACETAMINOPHEN 500 MG PO TABS: 500.0000 mg | ORAL_TABLET | Freq: Four times a day (QID) | ORAL | 0 refills | Status: DC | PRN

## 2021-02-07 NOTE — ED Triage Notes (Signed)
Pt here for left foot pain after fall x 2 that happened 2 days ago; pt with hx of stroke with left sided deficits

## 2021-02-07 NOTE — ED Provider Notes (Signed)
[name]    [name]: [id number] Arrival date & time: 02/07/21  1059      History   Chief Complaint Chief Complaint  Patient presents with  . Appointment    1100  . Foot Pain    HPI Abigail Campos is a 50 y.o. female.   Here today with left foot pain and mild swelling since a fall 2 days ago where she tripped walking to the bathroom and turned the foot inward. Painful to bear weight, some swelling but no discoloration, numbness, tingling. So far not trying anything OTC for sxs. No other injuries or concerns since fall. Hx of CVA with left sided deficits, she thinks this weakness contributed to her fall.      Past Medical History:  Diagnosis Date  . Anemia   . Anxiety and depression   . Back pain   . Essential hypertension 10/19/2007   03-2010: metoprolol changed to bystolic (was not feeling well on it, no specific allergy or reaction)    . Food allergy   . Hemiplegia ([place])   . Hyperlipemia   . Hypertension   . Joint pain   . Migraine   . Neuromuscular disorder ([place])    left sided hemiplegia - has brace for l leg and walks with a cane  . Obesity   . Ovarian cancer ([place])   . Prediabetes   . Sleep apnea    not wearing c-pap yet  . Sleep apnea   . Stroke ([place]) 2017  . Vitamin D deficiency     Patient Active Problem List   Diagnosis Date Noted  . Neuropathic pain 09/19/2020  . Adjustment disorder with mixed anxiety and depressed mood 09/19/2020  . Facial droop 08/30/2020  . Abnormality of gait 05/09/2020  . Hemiplegia of nondominant side, late effect of cerebrovascular disease ([place]) 03/28/2020  . Pleural effusion 03/01/2020  . Sleep disturbance 02/06/2020  . Spastic hemiplegia of left nondominant side due to nontraumatic intraparenchymal hemorrhage of brain ([place]) 12/26/2019  . Trigger thumb of right hand 11/02/2019  . Obstructive sleep apnea 07/26/2019  . Spastic hemiplegia of left nondominant side as late effect of nontraumatic intraparenchymal  hemorrhage of brain ([place]) 04/06/2019  . Spastic hemiparesis affecting dominant side ([place]) 01/04/2018  . Hyperglycemia 12/29/2016  . Anxiety and depression 12/29/2016  . Muscle spasticity   . Obesity (BMI 30.0-34.9) 04/08/2016  . Hyperlipidemia 04/08/2016  . Basal ganglia hemorrhage ([place]) 04/08/2016  . Dysphagia as late effect of cerebrovascular disease   . Migraine with aura and without status migrainosus, not intractable   . Gait disturbance, post-stroke   . Hemiplegia, post-stroke ([place])   . Aphasia, post-stroke   . Dysarthria, post-stroke   . ICH (intracerebral hemorrhage) ([facility]) - R basal ganglia due to hypertensive emergency 04/01/2016  . Upper airway cough syndrome 12/06/2015  . PCP NOTES >>>>>>>>>>>>>>>>>>>>>>>>>>>.. 09/25/2015  . Insomnia 12/28/2012  . Annual physical exam 11/23/2011  . Essential hypertension 10/19/2007    Past Surgical History:  Procedure Laterality Date  . ABDOMINAL HYSTERECTOMY  11-14-07   no oophorectomy per surgical report  . CESAREAN SECTION     [id number]  . INGUINAL HERNIA REPAIR     2004    OB History    Gravida  2   Para  2   Term  2   Preterm      AB      Living  2     SAB      IAB  Ectopic      Multiple      Live Births  2            Home Medications    Prior to Admission medications   Medication Sig Start Date End Date Taking? Authorizing Provider  acetaminophen (TYLENOL) 500 MG tablet Take 1 tablet (500 mg total) by mouth every 6 (six) hours as needed. 02/07/21  Yes Volney American, PA-C  amoxicillin-clavulanate (AUGMENTIN) 875-125 MG tablet Take 1 tablet by mouth 2 (two) times daily. Patient not taking: Reported on 02/07/2021 01/17/21   Colon Branch, MD  atorvastatin (LIPITOR) 20 MG tablet Take 1 tablet (20 mg total) by mouth daily. 02/05/21   Colon Branch, MD  baclofen (LIORESAL) 10 MG tablet Take 1 tablet (10 mg total) by mouth 3 (three) times daily. Take 3 tablets(30 mg total) three times daily. 11/01/20    Jamse Arn, MD  benzonatate (TESSALON) 100 MG capsule Take 1 capsule (100 mg total) by mouth every 8 (eight) hours. Patient not taking: Reported on 02/07/2021 01/22/21   Colon Branch, MD  buPROPion [facility]) 100 MG tablet Take 1 tablet (100 mg total) by mouth 2 (two) times daily. 12/09/20   Colon Branch, MD  carvedilol (COREG) 6.25 MG tablet Take 1 tablet (6.25 mg total) by mouth 2 (two) times daily with a meal. 10/21/20   Colon Branch, MD  clopidogrel (PLAVIX) 75 MG tablet Take 1 tablet (75 mg total) by mouth daily. 05/28/20   Colon Branch, MD  Cyanocobalamin (VITAMIN B 12 PO) Take 1 tablet by mouth daily.    [provider]  escitalopram (LEXAPRO) 20 MG tablet Take 1 tablet (20 mg total) by mouth daily. 10/21/20   Colon Branch, MD  fluconazole (DIFLUCAN) 150 MG tablet Take 1 tablet (150 mg total) by mouth daily. For 2 days 01/22/21   Colon Branch, MD  furosemide (LASIX) 20 MG tablet Take 1 tablet (20 mg total) by mouth daily. 08/12/20   Colon Branch, MD  gabapentin (NEURONTIN) 300 MG capsule TAKE 1 CAPSULE BY MOUTH THREE TIMES A DAY 12/24/20   Jamse Arn, MD  lisinopril (ZESTRIL) 2.5 MG tablet Take 1 tablet (2.5 mg total) by mouth daily. 12/09/20   Colon Branch, MD  nystatin (MYCOSTATIN) 100000 UNIT/ML suspension Take 5 mLs (500,000 Units total) by mouth 4 (four) times daily. Swish and spit. For 5 days 01/22/21   Colon Branch, MD  Semaglutide,0.25 or 0.5MG /DOS, (OZEMPIC, 0.25 OR 0.5 MG/DOSE,) 2 MG/1.5ML SOPN Inject 0.25 mg into the skin once a week. 01/27/21   Briscoe Deutscher, DO  Vitamin D, Ergocalciferol, (DRISDOL) 1.25 MG (50000 UNIT) CAPS capsule Take 1 capsule (50,000 Units total) by mouth every 7 (seven) days. 11/21/20   Colon Branch, MD  zaleplon (SONATA) 10 MG capsule Take 1 capsule (10 mg total) by mouth at bedtime as needed for sleep. 02/05/21   Colon Branch, MD    Family History Family History  Problem Relation Age of Onset  . Diabetes Mother   . Hypertension Mother   . Glaucoma  Mother   . Colon cancer Mother        mother age 3  . Cancer Mother   . Anxiety disorder Mother   . Obesity Mother   . Rectal cancer Father        dx 19  . Hypertension Father   . Cancer Father   . Sleep apnea Father   .  Alcoholism Father   . Obesity Father   . Stroke Paternal Grandfather   . Stroke Maternal Grandfather   . Diabetes Brother   . Hypertension Brother   . Heart disease Maternal Aunt        x 2 did 2012 CAD-CHF  . Diabetes Brother   . Hypertension Brother   . Lung cancer Maternal Aunt   . Breast cancer Neg Hx   . Esophageal cancer Neg Hx   . Stomach cancer Neg Hx     Social History Social History   Tobacco Use  . Smoking status: Never Smoker  . Smokeless tobacco: Never Used  Vaping Use  . Vaping Use: Never used  Substance Use Topics  . Alcohol use: No  . Drug use: No     Allergies   Bee venom, Peanut-containing drug products, Hydrocodone, Isovue [iopamidol], Ambien [zolpidem tartrate], Aspirin, and Latex   Review of Systems Review of Systems PER HPI   Physical Exam Triage Vital Signs ED Triage Vitals  Enc Vitals Group     BP 02/07/21 1108 117/83     Pulse Rate 02/07/21 1108 79     Resp 02/07/21 1108 18     Temp 02/07/21 1108 98.8 F (37.1 C)     Temp Source 02/07/21 1108 Oral     SpO2 02/07/21 1108 96 %     Weight --      Height --      Head Circumference --      Peak Flow --      Pain Score 02/07/21 1109 7     Pain Loc --      Pain Edu? --      Excl. in [place]? --    No data found.  Updated Vital Signs BP 117/83 (BP Location: Left Arm)   Pulse 79   Temp 98.8 F (37.1 C) (Oral)   Resp 18   SpO2 96%   Visual Acuity Right Eye Distance:   Left Eye Distance:   Bilateral Distance:    Right Eye Near:   Left Eye Near:    Bilateral Near:     Physical Exam Vitals and nursing note reviewed.  Constitutional:      Appearance: Normal appearance. She is not ill-appearing.  HENT:     Head: Atraumatic.  Eyes:     Extraocular  Movements: Extraocular movements intact.     Conjunctiva/sclera: Conjunctivae normal.  Cardiovascular:     Rate and Rhythm: Normal rate and regular rhythm.     Pulses: Normal pulses.     Heart sounds: Normal heart sounds.  Pulmonary:     Effort: Pulmonary effort is normal.     Breath sounds: Normal breath sounds.  Musculoskeletal:        General: Swelling (mild lateral left foot swelling ), tenderness (moderate ttp left lateral foot) and signs of injury present. No deformity.     Cervical back: Normal range of motion and neck supple.     Comments: In wheelchair, ROM of left leg/foot at baseline per patient  Skin:    General: Skin is warm and dry.     Findings: No bruising, erythema or rash.  Neurological:     Mental Status: She is alert and oriented to person, place, and time. Mental status is at baseline.     Sensory: No sensory deficit.     Comments: Left foot neurovascularly intact  Psychiatric:        Mood and Affect: Mood normal.  Thought Content: Thought content normal.        Judgment: Judgment normal.      UC Treatments / Results  Labs (all labs ordered are listed, but only abnormal results are displayed) Labs Reviewed - No data to display  EKG   Radiology DG Foot Complete Left  Result Date: 02/07/2021 CLINICAL DATA:  Pain and swelling following fall EXAM: LEFT FOOT - COMPLETE 3+ VIEW COMPARISON:  July 09, 2020 FINDINGS: Frontal, oblique, and lateral views obtained. Localized osteopenia in the lateral aspect of the distal most portion of the first distal phalanx is a stable finding. No fracture or dislocation. No erosive change. There is slight narrowing of the first MTP joint. Other joint spaces appear normal. There is a minimal inferior calcaneal spur. No erosive change. IMPRESSION: Chronic osteopenia in the lateral aspect of the distal portion of the first distal phalanx. No overt bony destruction. No similar changes elsewhere. Slight narrowing first MTP joint.  Other joint spaces appear normal. No fracture or dislocation. Minimal inferior calcaneal spur noted. Electronically Signed   By: Lowella Grip III M.D.   On: 02/07/2021 11:57    Procedures Procedures (including critical care time)  Medications Ordered in UC Medications - No data to display  Initial Impression / Assessment and Plan / UC Course  I have reviewed the triage vital signs and the nursing notes.  Pertinent labs & imaging results that were available during my care of the patient were reviewed by me and considered in my medical decision making (see chart for details).     Chronic bone density changes and bone spurs on left foot x-ray, but no acute fracture or dislocation. Ace wrap applied in clinic, discussed RICE protocol, tylenol prn. Return for worsening or un-resolving sxs.   Final Clinical Impressions(s) / UC Diagnoses   Final diagnoses:  Left foot pain  Fall, initial encounter   Discharge Instructions   None    ED Prescriptions    Medication Sig Dispense Auth. Provider   acetaminophen (TYLENOL) 500 MG tablet Take 1 tablet (500 mg total) by mouth every 6 (six) hours as needed. 30 tablet Volney American, [place]     PDMP not reviewed this encounter.   Volney American, [place] 02/07/21 1228

## 2021-02-10 ENCOUNTER — Telehealth: Payer: Self-pay | Admitting: Internal Medicine

## 2021-02-10 DIAGNOSIS — K862 Cyst of pancreas: Secondary | ICD-10-CM

## 2021-02-10 DIAGNOSIS — K869 Disease of pancreas, unspecified: Secondary | ICD-10-CM

## 2021-02-10 NOTE — Telephone Encounter (Signed)
Arrange MRI: "multiphasic contrast enhanced MRI abdomen and pelvis". DX:   follow-up pancreatic cyst (See MRI from 07/20/2020)

## 2021-02-10 NOTE — Telephone Encounter (Signed)
Key: [id number]

## 2021-02-11 ENCOUNTER — Other Ambulatory Visit: Payer: Self-pay

## 2021-02-11 ENCOUNTER — Encounter (INDEPENDENT_AMBULATORY_CARE_PROVIDER_SITE_OTHER): Payer: Self-pay | Admitting: Adult Health

## 2021-02-11 ENCOUNTER — Ambulatory Visit (INDEPENDENT_AMBULATORY_CARE_PROVIDER_SITE_OTHER): Payer: No Typology Code available for payment source | Admitting: Adult Health

## 2021-02-11 VITALS — BP 103/69 | HR 76 | Temp 97.3°F | Ht 59.0 in | Wt 153.0 lb

## 2021-02-11 DIAGNOSIS — E669 Obesity, unspecified: Secondary | ICD-10-CM

## 2021-02-11 DIAGNOSIS — U071 COVID-19: Secondary | ICD-10-CM | POA: Diagnosis not present

## 2021-02-11 DIAGNOSIS — E8881 Metabolic syndrome: Secondary | ICD-10-CM | POA: Diagnosis not present

## 2021-02-11 DIAGNOSIS — R7303 Prediabetes: Secondary | ICD-10-CM

## 2021-02-11 DIAGNOSIS — Z6830 Body mass index (BMI) 30.0-30.9, adult: Secondary | ICD-10-CM

## 2021-02-11 NOTE — Telephone Encounter (Signed)
I was unable to find an MRI order for abdomen AND pelvis together. There is one for an angio MRA abd/pelvis. The last MRI was did in August was just MRI abd w/ and w/o, would this be sufficient again?

## 2021-02-12 NOTE — Telephone Encounter (Signed)
Order placed

## 2021-02-12 NOTE — Telephone Encounter (Signed)
Okay to order it that way. In your comments please add that the radiologist requested "multiphasic contrast enhanced MRI".

## 2021-02-12 NOTE — Progress Notes (Signed)
Chief Complaint:   OBESITY Abigail Campos is here to discuss her progress with her obesity treatment plan along with follow-up of her obesity related diagnoses. Abigail Campos is on the Category 1 Plan and states she is following her eating plan approximately 100% of the time. Abigail Campos states she is doing 0 minutes 0 times per week.  Today's visit was #: 4 Starting weight: 164 lbs Starting date: 12/26/2020 Today's weight: 153 lbs Today's date: 02/11/2021 Total lbs lost to date: 11 lbs Total lbs lost since last in-office visit: 3 lbs  Interim History: Abigail Campos was initially started on Category 1 meal plan then converted to low carb 01/27/2021. However, she remained on Category 1. She was prescribed Ozempic 0.25 mg once weekly on 2/95/1884 for metabolic syndrome, but insurance denied coverage yesterday. She is tracking snack calories with My Fitness Pal. She usually snacks on fruit, such as oranges and grapes.  Subjective:   1. Pre-diabetes 11/19/2020 A1c was 6.0. Hiya is not on Metformin. Ozempic was prescribed for metabolic syndrome, however, insurance denied prescription coverage yesterday.  Lab Results  Component Value Date   HGBA1C 6.0 11/19/2020    2. COVID-19 Chavy completed a course of Augmentin last week due to COVID pneumonia and to treat dental infection. She is fully vaccinated with Pfizer COVID-19 vaccine and received booster- 02/04/2021.  3. Metabolic syndrome Ozempic was prescribed for metabolic syndrome, however, insurance denied prescription coverage yesterday.  Assessment/Plan:   1. Pre-diabetes Toryn will continue to work on weight loss, exercise, and decreasing simple carbohydrates to help decrease the risk of diabetes. Increase protein intake.  2. COVID-19 Monitor symptoms and follow up with PCP as needed.  3. Metabolic syndrome Call insurance to inquire which GLP-1 is covered, then contact clinic with information so we can order covered Rx.  4. Class 1 obesity with serious  comorbidity and body mass index (BMI) of 30.0 to 30.9 in adult, unspecified obesity type Abigail Campos is currently in the action stage of change. As such, her goal is to continue with weight loss efforts. She has agreed to the Category 1 Plan.   Exercise goals: No exercise has been prescribed at this time.  Behavioral modification strategies: increasing lean protein intake, decreasing simple carbohydrates, meal planning and cooking strategies and planning for success.  Abigail Campos has agreed to follow-up with our clinic in 2 weeks. She was informed of the importance of frequent follow-up visits to maximize her success with intensive lifestyle modifications for her multiple health conditions.   Objective:   Blood pressure 103/69, pulse 76, temperature (!) 97.3 F (36.3 C), height 4\' 11"  (1.499 m), weight 153 lb (69.4 kg), SpO2 96 %. Body mass index is 30.9 kg/m.  General: Cooperative, alert, well developed, in no acute distress. HEENT: Conjunctivae and lids unremarkable. Cardiovascular: Regular rhythm.  Lungs: Normal work of breathing. Neurologic: No focal deficits.   Lab Results  Component Value Date   CREATININE 0.96 01/22/2021   BUN 14 01/22/2021   NA 139 01/22/2021   K 4.1 01/22/2021   CL 101 01/22/2021   CO2 31 01/22/2021   Lab Results  Component Value Date   ALT 34 (H) 12/26/2020   AST 27 12/26/2020   ALKPHOS 103 12/26/2020   BILITOT 0.4 12/26/2020   Lab Results  Component Value Date   HGBA1C 6.0 11/19/2020   HGBA1C 6.1 (H) 08/31/2020   HGBA1C 5.7 02/14/2020   HGBA1C 6.0 07/12/2019   HGBA1C 6.2 02/27/2019   No results found for: INSULIN Lab Results  Component Value Date   TSH 1.310 12/26/2020   Lab Results  Component Value Date   CHOL 118 12/26/2020   HDL 45 12/26/2020   LDLCALC 61 12/26/2020   TRIG 55 12/26/2020   CHOLHDL 2.6 12/26/2020   Lab Results  Component Value Date   WBC 3.8 (L) 01/12/2021   HGB 13.2 01/12/2021   HCT 39.8 01/12/2021   MCV 92.3  01/12/2021   PLT 128 (L) 01/12/2021   Lab Results  Component Value Date   IRON 97 12/26/2020   TIBC 313 12/26/2020   FERRITIN 72 12/26/2020   Attestation Statements:   Reviewed by clinician on day of visit: allergies, medications, problem list, medical history, surgical history, family history, social history, and previous encounter notes.  Time spent on visit including pre-visit chart review and post-visit care and charting was 32 minutes.   Coral Ceo, am acting as Location manager for Mina Marble, NP.  I have reviewed the above documentation for accuracy and completeness, and I agree with the above. -  Kaylem Gidney d. Tessla Spurling, NP-C

## 2021-02-13 ENCOUNTER — Other Ambulatory Visit: Payer: Self-pay | Admitting: Internal Medicine

## 2021-02-13 ENCOUNTER — Other Ambulatory Visit: Payer: Self-pay | Admitting: Physical Medicine & Rehabilitation

## 2021-02-19 ENCOUNTER — Telehealth: Payer: Self-pay | Admitting: Internal Medicine

## 2021-02-19 NOTE — Telephone Encounter (Signed)
I spoke with physician representative for the insurance, explained that this is MRI follow-up of a pancreatic abnormality.  MRI approved until 08/18/2021

## 2021-02-19 NOTE — Telephone Encounter (Signed)
Gwen informed. Thank you.

## 2021-02-25 NOTE — Telephone Encounter (Signed)
Key: [id number]

## 2021-03-03 ENCOUNTER — Encounter (INDEPENDENT_AMBULATORY_CARE_PROVIDER_SITE_OTHER): Payer: Self-pay | Admitting: Family Medicine

## 2021-03-03 ENCOUNTER — Ambulatory Visit (INDEPENDENT_AMBULATORY_CARE_PROVIDER_SITE_OTHER): Payer: No Typology Code available for payment source | Admitting: Family Medicine

## 2021-03-03 ENCOUNTER — Other Ambulatory Visit: Payer: Self-pay

## 2021-03-03 VITALS — BP 99/65 | HR 78 | Temp 97.2°F | Ht 59.0 in | Wt 153.0 lb

## 2021-03-03 DIAGNOSIS — Z9189 Other specified personal risk factors, not elsewhere classified: Secondary | ICD-10-CM

## 2021-03-03 DIAGNOSIS — E669 Obesity, unspecified: Secondary | ICD-10-CM

## 2021-03-03 DIAGNOSIS — Z6831 Body mass index (BMI) 31.0-31.9, adult: Secondary | ICD-10-CM

## 2021-03-03 DIAGNOSIS — F3289 Other specified depressive episodes: Secondary | ICD-10-CM

## 2021-03-03 DIAGNOSIS — R7303 Prediabetes: Secondary | ICD-10-CM

## 2021-03-03 DIAGNOSIS — R232 Flushing: Secondary | ICD-10-CM | POA: Diagnosis not present

## 2021-03-03 MED ORDER — BUPROPION HCL ER (XL) 150 MG PO TB24: 150.0000 mg | ORAL_TABLET | Freq: Every day | ORAL | 0 refills | Status: DC

## 2021-03-03 MED ORDER — METFORMIN HCL 500 MG PO TABS: 500.0000 mg | ORAL_TABLET | Freq: Every day | ORAL | 0 refills | Status: DC

## 2021-03-06 ENCOUNTER — Encounter
Payer: No Typology Code available for payment source | Attending: Physical Medicine & Rehabilitation | Admitting: Physical Medicine & Rehabilitation

## 2021-03-06 ENCOUNTER — Encounter: Payer: Self-pay | Admitting: Physical Medicine & Rehabilitation

## 2021-03-06 ENCOUNTER — Other Ambulatory Visit: Payer: Self-pay

## 2021-03-06 VITALS — BP 108/75 | HR 88 | Temp 98.3°F | Ht 59.0 in | Wt 155.0 lb

## 2021-03-06 DIAGNOSIS — R269 Unspecified abnormalities of gait and mobility: Secondary | ICD-10-CM | POA: Diagnosis not present

## 2021-03-06 DIAGNOSIS — I69398 Other sequelae of cerebral infarction: Secondary | ICD-10-CM

## 2021-03-06 DIAGNOSIS — M792 Neuralgia and neuritis, unspecified: Secondary | ICD-10-CM | POA: Diagnosis not present

## 2021-03-06 DIAGNOSIS — I69154 Hemiplegia and hemiparesis following nontraumatic intracerebral hemorrhage affecting left non-dominant side: Secondary | ICD-10-CM | POA: Insufficient documentation

## 2021-03-06 NOTE — Progress Notes (Signed)
Chief Complaint:   OBESITY Abigail Campos is here to discuss her progress with her obesity treatment plan along with follow-up of her obesity related diagnoses.   Today's visit was #: 5 Starting weight: 164 lbs Starting date: 12/26/2020 Today's weight: 153 lbs Today's date: 03/03/2021 Total lbs lost to date: 11 lbs Body mass index is 30.9 kg/m.  Total weight loss percentage to date: -6.71%  Interim History:  Abigail Campos says she is frustrated with medication denials (GLP-1 RA).  Abigail Campos last week when she was told medication was not covered.  Current Meal Plan: the Category 1 Plan for 0% of the time.  Current Exercise Plan: Walking/core strengthening for 60-75 minutes 3 times per week.  Assessment/Plan:   1. Prediabetes Not at goal. Goal is HgbA1c < 5.7.  Medication: None.    Plan:  Start metformin 500 mg daily, as per below.  She will continue to focus on protein-rich, low simple carbohydrate foods. We reviewed the importance of hydration, regular exercise for stress reduction, and restorative sleep.   Lab Results  Component Value Date   HGBA1C 6.0 11/19/2020   - Start metFORMIN (GLUCOPHAGE) 500 MG tablet; Take 1 tablet (500 mg total) by mouth daily with breakfast.  Dispense: 30 tablet; Refill: 0  2. Hot flashes Women with obesity may experience more severe vasomotor symptoms at menopause than women with overweight or normal weight.  She is taking Neurontin 300 mg twice daily.  Plan:  Decrease Neurontin to once nightly.  3. Other depression, with emotional eating Not at goal. Medication: Wellbutrin XL 150 mg daily.  Plan:  Behavior modification techniques were discussed today to help deal with emotional/non-hunger eating behaviors.  - Refill buPROPion (WELLBUTRIN XL) 150 MG 24 hr tablet; Take 1 tablet (150 mg total) by mouth daily.  Dispense: 30 tablet; Refill: 0  4. At risk for impaired metabolic function Due to Abigail Campos's current state of health and medical condition(s), she is at  a significantly higher risk for impaired metabolic function.   At least 8 minutes was spent on counseling Abigail Campos about these concerns today.  This places the patient at a much greater risk to subsequently develop cardio-pulmonary conditions that can negatively affect the patient's quality of life.  I stressed the importance of reversing these risks factors.  The initial goal is to lose at least 5-10% of starting weight to help reduce risk factors.  Counseling:  Intensive lifestyle modifications discussed with Abigail Campos as the most appropriate first line treatment.  She will continue to work on diet, exercise, and weight loss efforts.  We will continue to reassess these conditions on a fairly regular basis in an attempt to decrease the patient's overall morbidity and mortality.  5. Class 1 obesity with serious comorbidity and body mass index (BMI) of 31.0 to 31.9 in adult, unspecified obesity type  Course: Abigail Campos is currently in the action stage of change. As such, her goal is to continue with weight loss efforts.   Nutrition goals: She has agreed to the Category 1 Plan.   Exercise goals: As is.  Behavioral modification strategies: increasing lean protein intake, decreasing simple carbohydrates, increasing vegetables and increasing water intake.  Abigail Campos has agreed to follow-up with our clinic in 2 weeks. She was informed of the importance of frequent follow-up visits to maximize her success with intensive lifestyle modifications for her multiple health conditions.   Objective:   Blood pressure 99/65, pulse 78, temperature (!) 97.2 F (36.2 C), temperature source Oral, height 4\' 11"  (  1.499 m), weight 153 lb (69.4 kg), SpO2 96 %. Body mass index is 30.9 kg/m.  General: Cooperative, alert, well developed, in no acute distress. HEENT: Conjunctivae and lids unremarkable. Cardiovascular: Regular rhythm.  Lungs: Normal work of breathing. Neurologic: No focal deficits.   Lab Results  Component Value Date    CREATININE 0.96 01/22/2021   BUN 14 01/22/2021   NA 139 01/22/2021   K 4.1 01/22/2021   CL 101 01/22/2021   CO2 31 01/22/2021   Lab Results  Component Value Date   ALT 34 (H) 12/26/2020   AST 27 12/26/2020   ALKPHOS 103 12/26/2020   BILITOT 0.4 12/26/2020   Lab Results  Component Value Date   HGBA1C 6.0 11/19/2020   HGBA1C 6.1 (H) 08/31/2020   HGBA1C 5.7 02/14/2020   HGBA1C 6.0 07/12/2019   HGBA1C 6.2 02/27/2019   Lab Results  Component Value Date   TSH 1.310 12/26/2020   Lab Results  Component Value Date   CHOL 118 12/26/2020   HDL 45 12/26/2020   LDLCALC 61 12/26/2020   TRIG 55 12/26/2020   CHOLHDL 2.6 12/26/2020   Lab Results  Component Value Date   WBC 3.8 (L) 01/12/2021   HGB 13.2 01/12/2021   HCT 39.8 01/12/2021   MCV 92.3 01/12/2021   PLT 128 (L) 01/12/2021   Lab Results  Component Value Date   IRON 97 12/26/2020   TIBC 313 12/26/2020   FERRITIN 72 12/26/2020   Attestation Statements:   Reviewed by clinician on day of visit: allergies, medications, problem list, medical history, surgical history, family history, social history, and previous encounter notes.  I, Water quality scientist, CMA, am acting as transcriptionist for Briscoe Deutscher, DO  I have reviewed the above documentation for accuracy and completeness, and I agree with the above. Briscoe Deutscher, DO

## 2021-03-06 NOTE — Progress Notes (Signed)
Subjective:    Patient ID: Abigail Campos, female    DOB: December 17, 1970, 50 y.o.   MRN: [id number]  HPI:  Right-handed female with history of hypertension, migraine headaches presents for follow up after right basal ganglia hemorrhage.   Last clinic visit 01/23/2021.  She had this for injection at that time, since that time, patient states she had short term relief with injection. She states she is tolerating increase in Baclofen, but it works sometimes and not others. Had a fall when she got up to use the restroom, her leg gave out. Her thumb stable.  Pain Inventory Average Pain 4 Pain Right Now 0 My pain is intermittent, sharp, stabbing, tingling and aching  In the last 24 hours, has pain interfered with the following? General activity 6 Relation with others 10 Enjoyment of life 6 What TIME of day is your pain at its worst? varies Sleep (in general) Fair  Pain is worse with: walking, inactivity, standing and some activites Pain improves with: rest Relief from Meds: no pain meds    Family History  Problem Relation Age of Onset  . Diabetes Mother   . Hypertension Mother   . Glaucoma Mother   . Colon cancer Mother        mother age 55  . Cancer Mother   . Anxiety disorder Mother   . Obesity Mother   . Rectal cancer Father        dx 62  . Hypertension Father   . Cancer Father   . Sleep apnea Father   . Alcoholism Father   . Obesity Father   . Stroke Paternal Grandfather   . Stroke Maternal Grandfather   . Diabetes Brother   . Hypertension Brother   . Heart disease Maternal Aunt        x 2 did 2012 CAD-CHF  . Diabetes Brother   . Hypertension Brother   . Lung cancer Maternal Aunt   . Breast cancer Neg Hx   . Esophageal cancer Neg Hx   . Stomach cancer Neg Hx    Social History   Socioeconomic History  . Marital status: Married    Spouse name: Not on file  . Number of children: 2  . Years of education: Not on file  . Highest education level: Not on file   Occupational History  . Occupation: disable d/t stroke TEACHER    Employer: [employer]  Tobacco Use  . Smoking status: Never Smoker  . Smokeless tobacco: Never Used  Vaping Use  . Vaping Use: Never used  Substance and Sexual Activity  . Alcohol use: No  . Drug use: No  . Sexual activity: Not Currently    Birth control/protection: Surgical  Other Topics Concern  . Not on file  Social History Narrative   Used to live independently, stroke 03-2016, d/c from rehab to his mother's house 04-2016, then moved back w/ her husband as off  02/2020   Daughter lives in [place],   [place] lives w/ pt   Social Determinants of Health   Financial Resource Strain: Not on file  Food Insecurity: Not on file  Transportation Needs: Not on file  Physical Activity: Not on file  Stress: Not on file  Social Connections: Not on file   Past Surgical History:  Procedure Laterality Date  . ABDOMINAL HYSTERECTOMY  11-14-07   no oophorectomy per surgical report  . CESAREAN SECTION     [id number]  . INGUINAL HERNIA REPAIR  2004   Past Medical History:  Diagnosis Date  . Anemia   . Anxiety and depression   . Back pain   . Essential hypertension 10/19/2007   03-2010: metoprolol changed to bystolic (was not feeling well on it, no specific allergy or reaction)    . Food allergy   . Hemiplegia ([place])   . Hyperlipemia   . Hypertension   . Joint pain   . Migraine   . Neuromuscular disorder ([place])    left sided hemiplegia - has brace for l leg and walks with a cane  . Obesity   . Ovarian cancer ([place])   . Prediabetes   . Sleep apnea    not wearing c-pap yet  . Sleep apnea   . Stroke ([place]) 2017  . Vitamin D deficiency    BP 108/75   Pulse 88   Temp 98.3 F (36.8 C)   Ht 4\' 11"  (1.499 m)   Wt 155 lb (70.3 kg)   SpO2 95%   BMI 31.31 kg/m   Opioid Risk Score:   Fall Risk Score:  `1  Depression screen PHQ 2/9  Depression screen [facility] 2/9 12/26/2020 11/19/2020 10/03/2020 09/19/2020 07/01/2020  06/24/2020 05/09/2020  Decreased Interest 0 1 0 1 1 2 1   Down, Depressed, Hopeless 2 1 0 1 1 2 1   PHQ - 2 Score 2 2 0 2 2 4 2   Altered sleeping 3 3 - - - 3 -  Tired, decreased energy 3 2 - - - 2 -  Change in appetite 2 2 - - - 3 -  Feeling bad or failure about yourself  2 2 - - - 0 -  Trouble concentrating 1 0 - - - 2 -  Moving slowly or fidgety/restless 2 1 - - - 3 -  Suicidal thoughts 1 1 - - - 0 -  PHQ-9 Score 16 13 - - - 17 -  Difficult doing work/chores Somewhat difficult Somewhat difficult - - - Somewhat difficult -  Some recent data might be hidden   Review of Systems  Musk: Gait abnormality, arthralgias, toe curling/cramping Neurological: Positive for weakness, numbness, tingling.  All other systems reviewed and are negative.     Objective:     Constitutional: No distress . Vital signs reviewed. HENT: Normocephalic.  Atraumatic. Eyes: EOMI. No discharge. Cardiovascular: No JVD.  RRR. Respiratory: Normal effort.  No stridor.  Bilateral clear to auscultation. GI: Non-distended.  BS +. Skin: Warm and dry.  Intact. Psych: Normal mood.  Normal behavior. Musc: No edema in extremities.  No tenderness in extremities. Gait: Hemiparetic Neurological: Alert Motor:  LUE: Shoulder abduction 2-/5, distally 0/5 LLE: Hip flexion 4-/5, knee extension 4/5, ADF/PF 1/5 Modified [place]: Left 0/4 elbow flexors, wrist flexors 2/4, 3/4 left finger flexors,1+/4 ankle plantar flexors.     Assessment & Plan:  Right-handed female with history of hypertension, migraine headaches presents for follow up after right basal ganglia hemorrhage.    1. 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 affecting left nondominant side             Continue meds             Cont Follow up Neurology             Spastic hemiplegia affecting left non-dominant side             Will increase baclofen to 30mg  TID  Continue Dysport injection:                         Left Med  biceps: Cont 100 units                         Left FCR: Cont 100 units                         Left FCU: Cont 100 units                         Left FDS: 450 units                         Left FDP: 450 units                          Left Med gastroc:Cont 150                          Left Lat gastroc: Cont 150              Continue HEP             Cont WHO   Order written for new AFO  Disability forms filled out  2. Gait abnormality             Continue narrow quad cane             Continue HEP  3. Right trigger thumb             Overuse injury as well with positioning             Figure 8 splint, not using at present             Pt to consider surgery referral in future if necessary             Good benefit with injection on 10/29             Hold off on further injection at present  Improved  4. Sleep disturbance             Continue to follow with sleep specialist  5. Anxiety/Depression  Will make referral for Neuropsych, has not seen  Is planning on going to group therapy  6. Neuropathic pain - mainly in left foot  Continue Gabapentin 300 TID

## 2021-03-07 ENCOUNTER — Telehealth: Payer: Self-pay | Admitting: *Deleted

## 2021-03-07 NOTE — Telephone Encounter (Signed)
Script faxed to [facility] for [facility]

## 2021-03-11 ENCOUNTER — Other Ambulatory Visit (INDEPENDENT_AMBULATORY_CARE_PROVIDER_SITE_OTHER): Payer: Self-pay | Admitting: Family Medicine

## 2021-03-11 ENCOUNTER — Other Ambulatory Visit: Payer: Self-pay | Admitting: Internal Medicine

## 2021-03-11 DIAGNOSIS — R7303 Prediabetes: Secondary | ICD-10-CM

## 2021-03-11 DIAGNOSIS — F3289 Other specified depressive episodes: Secondary | ICD-10-CM

## 2021-03-13 ENCOUNTER — Encounter: Payer: Self-pay | Admitting: Internal Medicine

## 2021-03-17 ENCOUNTER — Encounter (INDEPENDENT_AMBULATORY_CARE_PROVIDER_SITE_OTHER): Payer: Self-pay | Admitting: Adult Health

## 2021-03-17 ENCOUNTER — Other Ambulatory Visit: Payer: Self-pay

## 2021-03-17 ENCOUNTER — Ambulatory Visit (INDEPENDENT_AMBULATORY_CARE_PROVIDER_SITE_OTHER): Payer: No Typology Code available for payment source | Admitting: Adult Health

## 2021-03-17 VITALS — BP 102/67 | HR 95 | Temp 98.1°F | Ht 59.0 in | Wt 154.0 lb

## 2021-03-17 DIAGNOSIS — Z9189 Other specified personal risk factors, not elsewhere classified: Secondary | ICD-10-CM | POA: Diagnosis not present

## 2021-03-17 DIAGNOSIS — R232 Flushing: Secondary | ICD-10-CM

## 2021-03-17 DIAGNOSIS — R7303 Prediabetes: Secondary | ICD-10-CM | POA: Diagnosis not present

## 2021-03-17 DIAGNOSIS — K5909 Other constipation: Secondary | ICD-10-CM

## 2021-03-17 DIAGNOSIS — F3289 Other specified depressive episodes: Secondary | ICD-10-CM | POA: Diagnosis not present

## 2021-03-17 DIAGNOSIS — E669 Obesity, unspecified: Secondary | ICD-10-CM

## 2021-03-17 DIAGNOSIS — Z8673 Personal history of transient ischemic attack (TIA), and cerebral infarction without residual deficits: Secondary | ICD-10-CM

## 2021-03-17 DIAGNOSIS — Z6831 Body mass index (BMI) 31.0-31.9, adult: Secondary | ICD-10-CM

## 2021-03-17 MED ORDER — BUPROPION HCL ER (XL) 150 MG PO TB24: 150.0000 mg | ORAL_TABLET | Freq: Every day | ORAL | 0 refills | Status: DC

## 2021-03-17 MED ORDER — METFORMIN HCL 500 MG PO TABS: 500.0000 mg | ORAL_TABLET | Freq: Every day | ORAL | 0 refills | Status: DC

## 2021-03-18 DIAGNOSIS — K5909 Other constipation: Secondary | ICD-10-CM | POA: Insufficient documentation

## 2021-03-18 NOTE — Progress Notes (Signed)
Chief Complaint:   OBESITY Abigail Campos is here to discuss her progress with her obesity treatment plan along with follow-up of her obesity related diagnoses. Abigail Campos is on the Category 1 Plan and states she is following her eating plan approximately 100% of the time. Abigail Campos states she is walking 20-30 minutes 7 times per week.  Today's visit was #: 6 Starting weight: 164 lbs Starting date: 12/26/2020 Today's weight: 154 lbs Today's date: 03/17/2021 Total lbs lost to date: 10 lbs Total lbs lost since last in-office visit: 0  Interim History: Abigail Campos will walk on level ground in her neighborhood. She has experienced worsening constipation the last 2 weeks, which she believes if related to new Metformin prescription.  Subjective:   1. Pre-diabetes Abigail Campos's GLP-1 therapy would not be covered by her insurance.  She was started on Metformin 500 mg QD at last OV. She reports worsening constipation since starting the Metformin.   2. Hot flashes She has reduced Gabapentin 300 mg capsule from TID to BID. She reports no change in hot flash activity. She will experience hot flashes 3-4 times a day, with symptoms lasting approximately 10 minutes. This has been occurring the last 2-3 years.  3. Other depression, with emotional eating Abigail Campos's Wellbutrin was recently changed from 100 mg BID to 150 QD. She reports stable mood. Pt denies suicidal or homicidal ideations. Her BP/HR are stable at OV. She denies history of seizures.   4. Other constipation Abigail Campos reports that she has been unable to have a bowel movement the last week. Her last BM is estimated to have occurred 03/08/2021. Yesterday she was straining to have BM and did not pass stool, only dark red blood. She has a history of hemorrhoids.  Epic review- pt saw GI/Dr. Havery Moros 10/04/2019 for chronic constipation and colonoscopy. It was recommended that she take either OTC Miralax or prescription Linzess 145 mg-which worked. Her mother had colon cancer  at age 48.  50. At risk for deficient intake of food Abigail Campos is at risk for deficient intake of food due to worsening constipation.  Assessment/Plan:   1. Pre-diabetes Abigail Campos will continue to work on weight loss, exercise, and decreasing simple carbohydrates to help decrease the risk of diabetes.   - metFORMIN (GLUCOPHAGE) 500 MG tablet; Take 1 tablet (500 mg total) by mouth daily with breakfast.  Dispense: 30 tablet; Refill: 0  2. Hot flashes Continue current gabapentin BID.  3. Other depression, with emotional eating Behavior modification techniques were discussed today to help Abigail Campos deal with her emotional/non-hunger eating behaviors.  Orders and follow up as documented in patient record.   - buPROPion (WELLBUTRIN XL) 150 MG 24 hr tablet; Take 1 tablet (150 mg total) by mouth daily.  Dispense: 30 tablet; Refill: 0  4. Other constipation Abigail Campos was informed that a decrease in bowel movement frequency is normal while losing weight, but stools should not be hard or painful. Orders and follow up as documented in patient record. Follow up with GI/Dr. Havery Moros ASAP.  Counseling Getting to Good Bowel Health: Your goal is to have one soft bowel movement each day. Drink at least 8 glasses of water each day. Eat plenty of fiber (goal is over 25 grams each day). It is best to get most of your fiber from dietary sources which includes leafy green vegetables, fresh fruit, and whole grains. You may need to add fiber with the help of OTC fiber supplements. These include Metamucil, Citrucel, and Flaxseed. If you are still having  trouble, try adding Miralax.. If all of these changes do not work, Cabin crew.  5. At risk for deficient intake of food Abigail Campos was given approximately 15 minutes of deficit intake of food prevention counseling today. Abigail Campos is at risk for eating too few calories based on current food recall. She was encouraged to focus on meeting caloric and protein goals according to  her recommended meal plan.   6. Class 1 obesity with serious comorbidity and body mass index (BMI) of 31.0 to 31.9 in adult, unspecified obesity type Abigail Campos is currently in the action stage of change. As such, her goal is to continue with weight loss efforts. She has agreed to the Category 1 Plan.   Follow up with GI/Dr. Havery Moros.  Exercise goals: As is  Behavioral modification strategies: increasing lean protein intake, decreasing simple carbohydrates, no skipping meals, meal planning and cooking strategies and planning for success.  Abigail Campos has agreed to follow-up with our clinic in 3 weeks. She was informed of the importance of frequent follow-up visits to maximize her success with intensive lifestyle modifications for her multiple health conditions.   Objective:   Blood pressure 102/67, pulse 95, temperature 98.1 F (36.7 C), height 4\' 11"  (1.499 m), weight 154 lb (69.9 kg), SpO2 98 %. Body mass index is 31.1 kg/m.  General: Cooperative, alert, well developed, in no acute distress. HEENT: Conjunctivae and lids unremarkable. Cardiovascular: Regular rhythm.  Lungs: Normal work of breathing. Neurologic: No focal deficits.   Lab Results  Component Value Date   CREATININE 0.96 01/22/2021   BUN 14 01/22/2021   NA 139 01/22/2021   K 4.1 01/22/2021   CL 101 01/22/2021   CO2 31 01/22/2021   Lab Results  Component Value Date   ALT 34 (H) 12/26/2020   AST 27 12/26/2020   ALKPHOS 103 12/26/2020   BILITOT 0.4 12/26/2020   Lab Results  Component Value Date   HGBA1C 6.0 11/19/2020   HGBA1C 6.1 (H) 08/31/2020   HGBA1C 5.7 02/14/2020   HGBA1C 6.0 07/12/2019   HGBA1C 6.2 02/27/2019   No results found for: INSULIN Lab Results  Component Value Date   TSH 1.310 12/26/2020   Lab Results  Component Value Date   CHOL 118 12/26/2020   HDL 45 12/26/2020   LDLCALC 61 12/26/2020   TRIG 55 12/26/2020   CHOLHDL 2.6 12/26/2020   Lab Results  Component Value Date   WBC 3.8 (L)  01/12/2021   HGB 13.2 01/12/2021   HCT 39.8 01/12/2021   MCV 92.3 01/12/2021   PLT 128 (L) 01/12/2021   Lab Results  Component Value Date   IRON 97 12/26/2020   TIBC 313 12/26/2020   FERRITIN 72 12/26/2020    Attestation Statements:   Reviewed by clinician on day of visit: allergies, medications, problem list, medical history, surgical history, family history, social history, and previous encounter notes.  Coral Ceo, am acting as Location manager for Mina Marble, NP.  I have reviewed the above documentation for accuracy and completeness, and I agree with the above. -  Quantavius Humm d. Moshe Wenger, NP-C

## 2021-03-22 ENCOUNTER — Other Ambulatory Visit: Payer: Self-pay

## 2021-03-22 ENCOUNTER — Ambulatory Visit (HOSPITAL_BASED_OUTPATIENT_CLINIC_OR_DEPARTMENT_OTHER)
Admission: RE | Admit: 2021-03-22 | Discharge: 2021-03-22 | Disposition: A | Discharge status: Home / Self Care | Payer: No Typology Code available for payment source | Source: Ambulatory Visit | Attending: Internal Medicine | Admitting: Internal Medicine

## 2021-03-22 DIAGNOSIS — I700 Atherosclerosis of aorta: Secondary | ICD-10-CM | POA: Diagnosis not present

## 2021-03-22 DIAGNOSIS — N281 Cyst of kidney, acquired: Secondary | ICD-10-CM | POA: Diagnosis not present

## 2021-03-22 DIAGNOSIS — K862 Cyst of pancreas: Secondary | ICD-10-CM | POA: Diagnosis not present

## 2021-03-22 DIAGNOSIS — K869 Disease of pancreas, unspecified: Secondary | ICD-10-CM | POA: Insufficient documentation

## 2021-03-22 DIAGNOSIS — K7689 Other specified diseases of liver: Secondary | ICD-10-CM | POA: Diagnosis not present

## 2021-03-22 IMAGING — MR MR ABDOMEN WO/W CM
11 of 19 series · 25 of 48 positions shown · IV contrast (GADAVIST)
Comparison: [DATE]

CLINICAL DATA: Follow-up of pancreatic cystic lesion. Ovarian
cancer 15 years ago with hysterectomy.

EXAM:
MRI ABDOMEN WITHOUT AND WITH CONTRAST
TECHNIQUE: Multiplanar multisequence MR imaging of the abdomen was performed
both before and after the administration of intravenous contrast.
CONTRAST:  7mL GADAVIST GADOBUTROL 1 MMOL/ML IV SOLN

[Series 4: T2 · coronal · 7.0mm · 1.33mm/px · 1 of 27 slices shown]
[im 1/27]
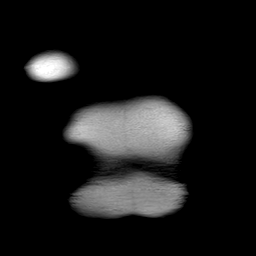

[Series 5: in + out · axial · 6.0mm · 0.66mm/px · z∈[-121,+97]mm · 2 of 60 slices shown]
[im 1/60]
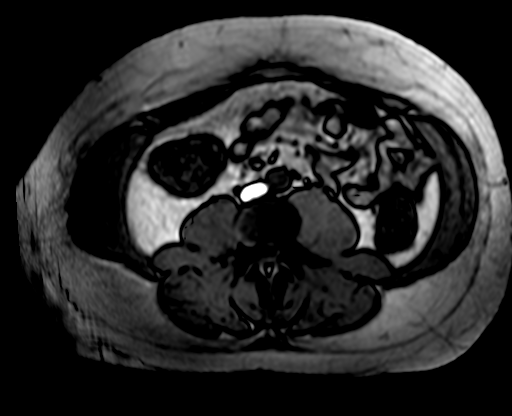
[im 60/60]
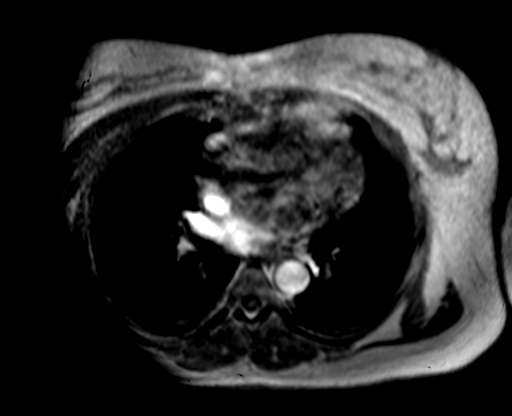

[Series 6: T2 fat-sat · axial · 6.0mm · 1.37mm/px · 1 of 34 slices shown]
[im 1/34]
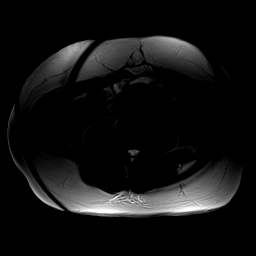

[Series 7: ep2d_[id number]_500_800 free breathing · axial · 6.0mm · 1.82mm/px · z∈[-124,+94]mm · 4 of 89 slices shown]
[im 1/89]
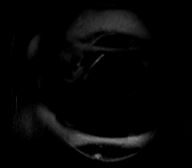
[im 30/89]
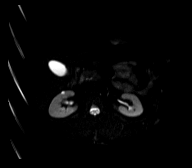
[im 59/89]
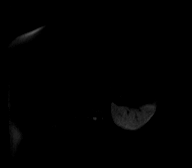
[im 89/89]
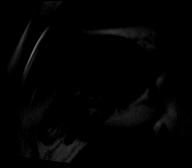

[Series 8: ep2d_[id number]_800 free breathing_adc · axial · 6.0mm · 1.82mm/px · 1 of 29 slices shown]
[im 1/29]
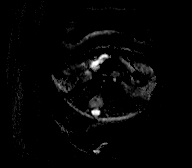

[Series 9: T1 dynamic · axial · non-contrast · 4.4mm · 0.66mm/px · z∈[-135,+107]mm · 3 of 56 slices shown (1 of 6)]
[im 1/56]
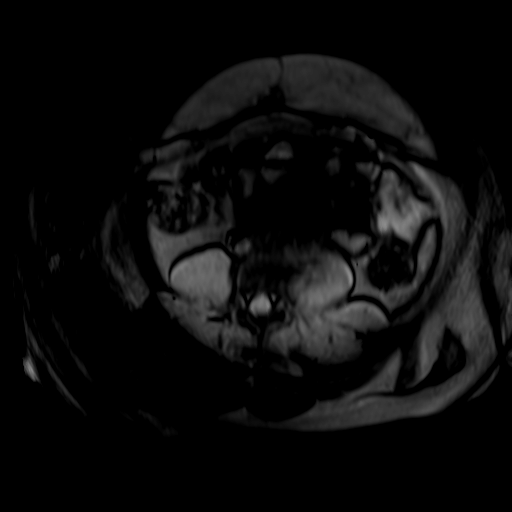
[im 28/56]
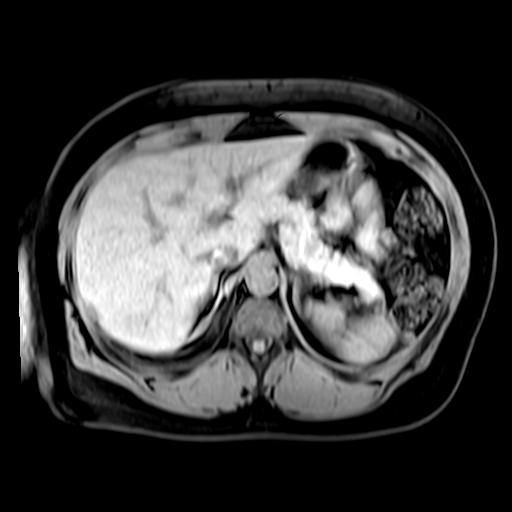
[im 56/56]
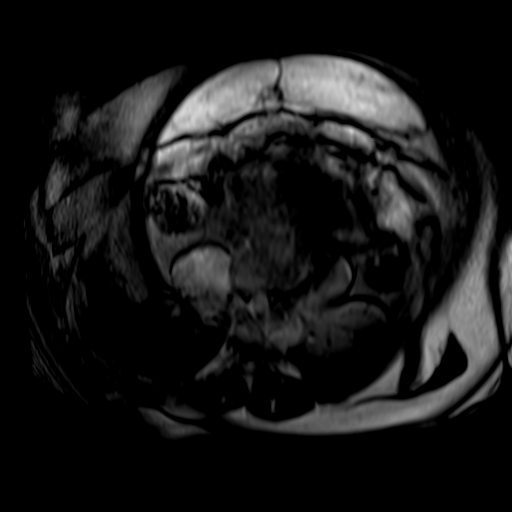

[Series 10: T1 dynamic · axial · 4.4mm · 0.66mm/px · z∈[-135,+107]mm · 3 of 56 slices shown (2 of 6)]
[im 1/56]
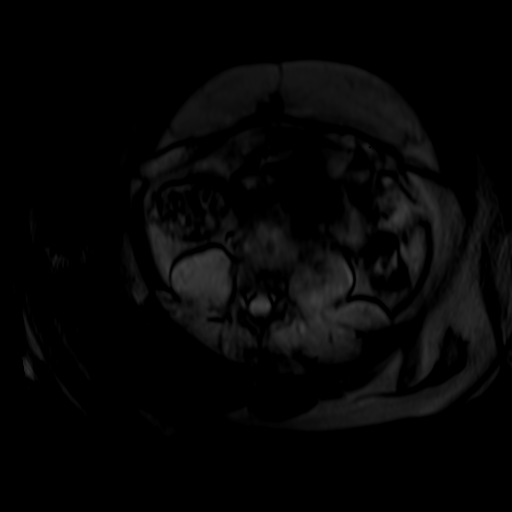
[im 28/56]
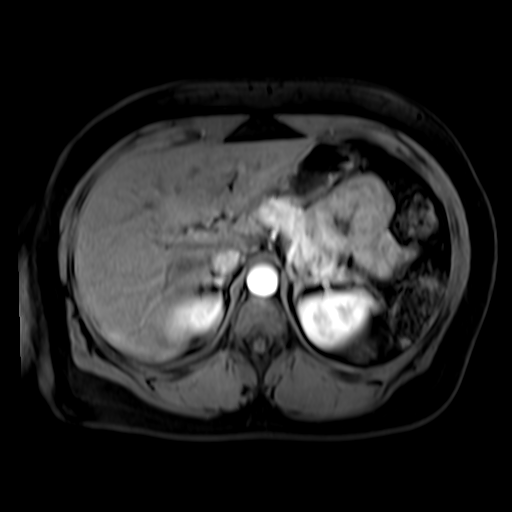
[im 56/56]
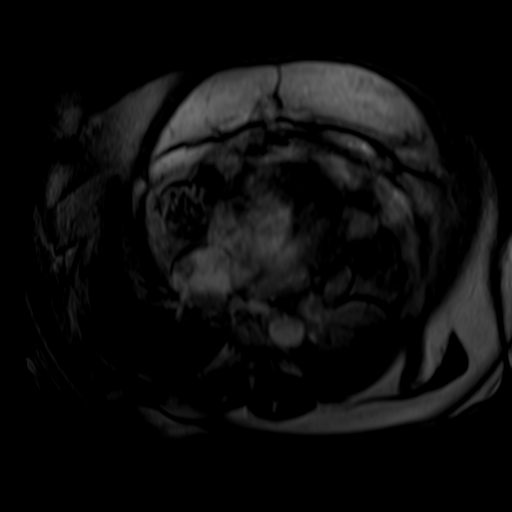

[Series 11: T1 dynamic · axial · 4.4mm · 0.66mm/px · z∈[-135,+107]mm · 3 of 56 slices shown (3 of 6)]
[im 1/56]
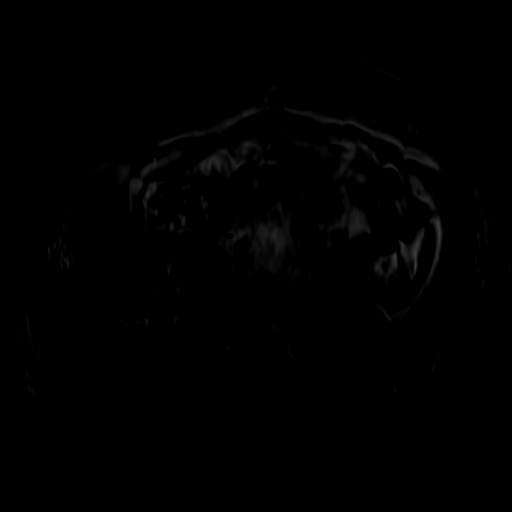
[im 28/56]
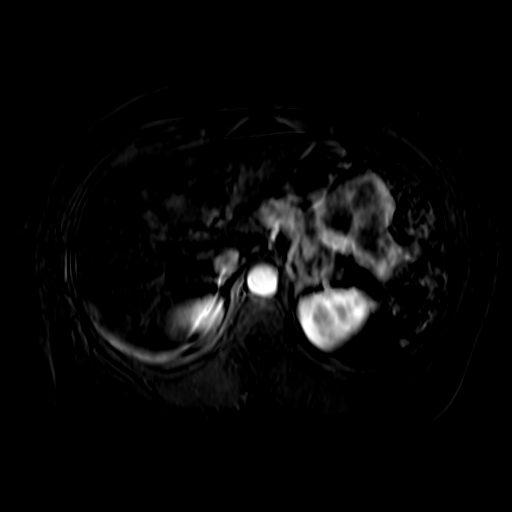
[im 56/56]
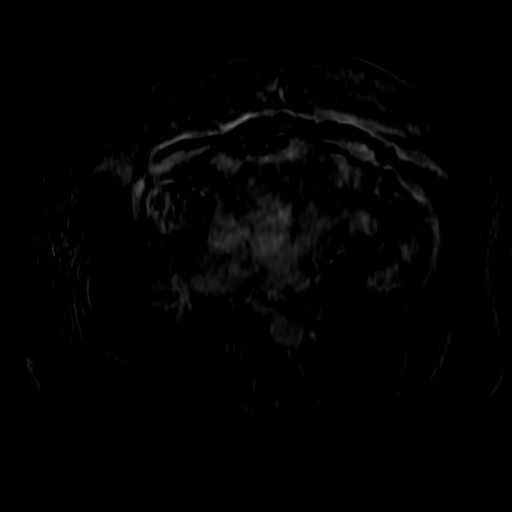

[Series 12: T1 dynamic · axial · 4.4mm · 0.66mm/px · z∈[-135,+107]mm · 3 of 56 slices shown (4 of 6)]
[im 1/56]
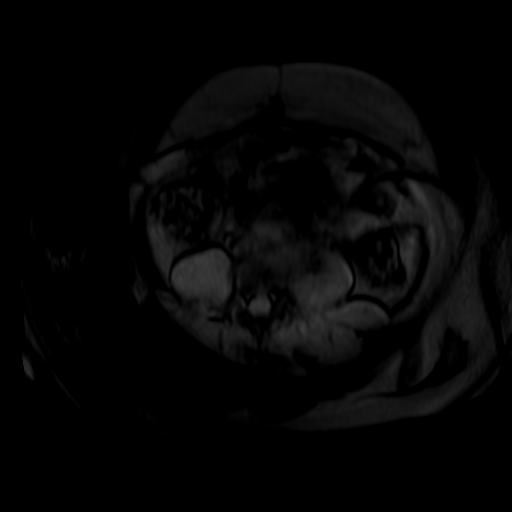
[im 28/56]
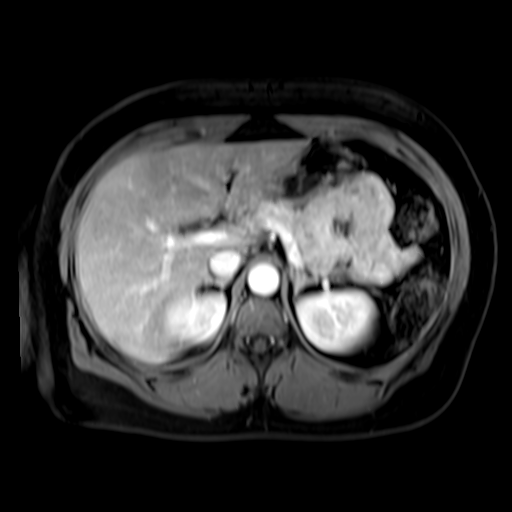
[im 56/56]
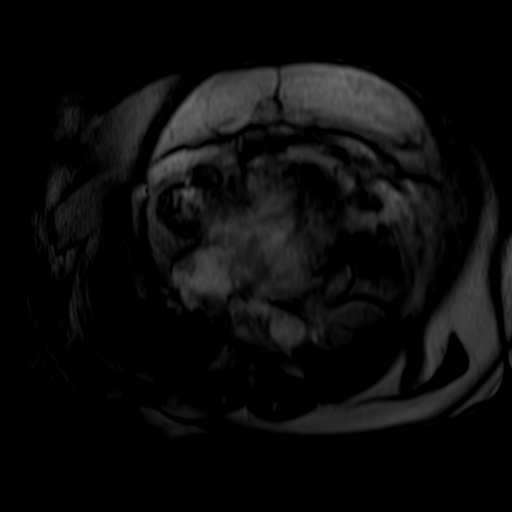

[Series 13: T1 dynamic · axial · 4.4mm · 0.66mm/px · z∈[-135,+107]mm · 3 of 56 slices shown (5 of 6)]
[im 1/56]
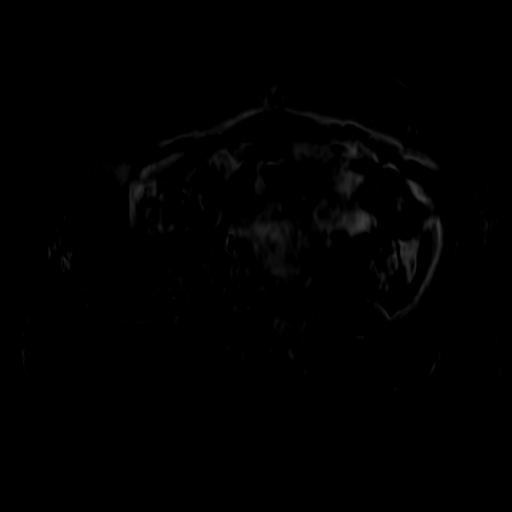
[im 28/56]
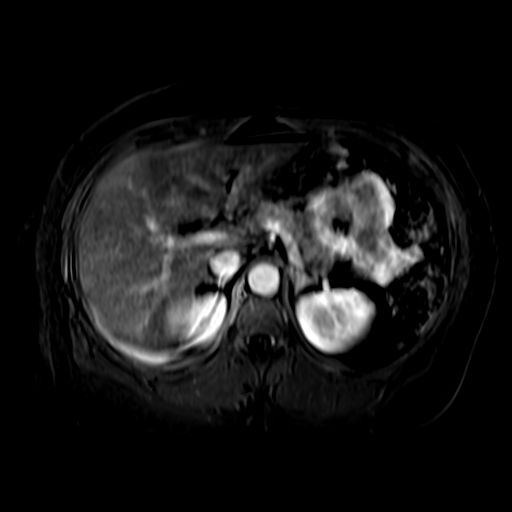
[im 56/56]
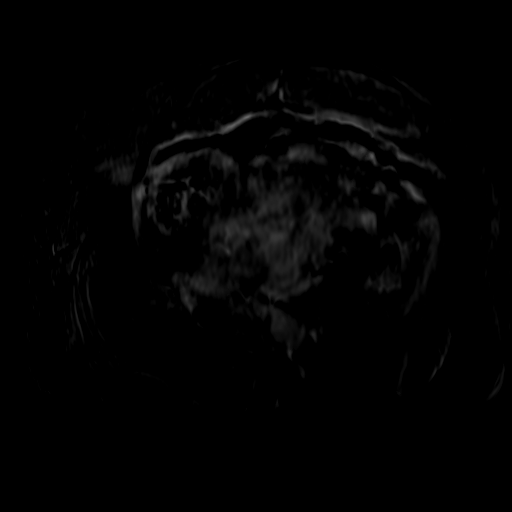

[Series 14: T1 dynamic · axial · 4.4mm · 0.66mm/px · 1 of 56 slices shown (6 of 6)]
[im 1/56]
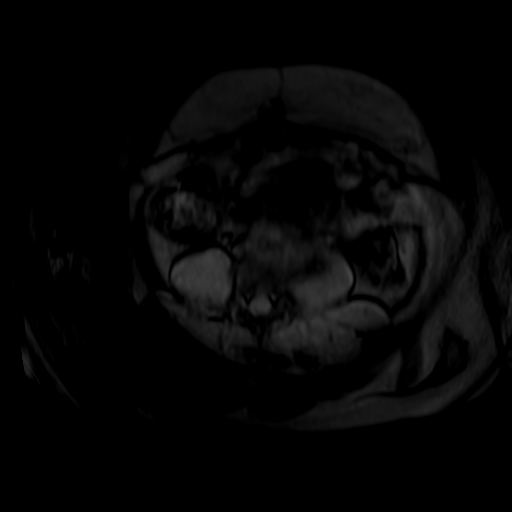

[25 of 48 positions shown; findings below may reference images not displayed]

FINDINGS: Lower chest: Mild cardiomegaly with trace bilateral pleural
effusions, similar.

Hepatobiliary: High left hepatic lobe subcentimeter cyst. Normal
gallbladder, without biliary ductal dilatation.

Pancreas: Bilobed cystic lesion within the pancreatic body is
significantly decreased in size. Most apparent on diffusion-weighted
image [DATE] at 1.2 x 0.7 cm. Compare 1.6 x 1.2 cm on the prior exam.
Also compare image 14 and 15 of series 9 today to image 11 of 22 on
the prior. No complex features or postcontrast enhancement.

No acute pancreatitis or duct dilatation.

Spleen:  Normal in size, without focal abnormality.

Adrenals/Urinary Tract: Normal adrenal glands. Normal left kidney.
Anterior interpolar right renal subcentimeter cyst.

Stomach/Bowel: Normal stomach and small bowel. Colonic stool burden
suggests constipation.

Vascular/Lymphatic: Normal caliber of the aorta and branch vessels.
No retroperitoneal or retrocrural adenopathy.

Other:  No ascites.

Musculoskeletal: No acute osseous abnormality.
IMPRESSION: 1. Decrease in size of a bilobed pancreatic body cystic lesion,
favoring resolving pseudocyst.
2.  No acute abdominal process.
3.  Aortic Atherosclerosis ([4V]-[4V]).
4. Similar small bilateral pleural effusions.

## 2021-03-22 MED ORDER — GADOBUTROL 1 MMOL/ML IV SOLN
7.0000 mL | Freq: Once | INTRAVENOUS | Status: AC | PRN
  Administered 2021-03-22: 7 mL via INTRAVENOUS

## 2021-03-29 ENCOUNTER — Other Ambulatory Visit: Payer: Self-pay | Admitting: Physical Medicine & Rehabilitation

## 2021-04-04 ENCOUNTER — Other Ambulatory Visit: Payer: Self-pay | Admitting: Physical Medicine & Rehabilitation

## 2021-04-14 ENCOUNTER — Other Ambulatory Visit: Payer: Self-pay

## 2021-04-14 ENCOUNTER — Encounter (INDEPENDENT_AMBULATORY_CARE_PROVIDER_SITE_OTHER): Payer: Self-pay | Admitting: Family Medicine

## 2021-04-14 ENCOUNTER — Ambulatory Visit (INDEPENDENT_AMBULATORY_CARE_PROVIDER_SITE_OTHER): Payer: No Typology Code available for payment source | Admitting: Family Medicine

## 2021-04-14 VITALS — BP 108/68 | HR 76 | Temp 98.4°F | Ht 59.0 in | Wt 151.0 lb

## 2021-04-14 DIAGNOSIS — E559 Vitamin D deficiency, unspecified: Secondary | ICD-10-CM

## 2021-04-14 DIAGNOSIS — Z9189 Other specified personal risk factors, not elsewhere classified: Secondary | ICD-10-CM | POA: Diagnosis not present

## 2021-04-14 DIAGNOSIS — F3289 Other specified depressive episodes: Secondary | ICD-10-CM | POA: Diagnosis not present

## 2021-04-14 DIAGNOSIS — R7303 Prediabetes: Secondary | ICD-10-CM

## 2021-04-14 DIAGNOSIS — Z8673 Personal history of transient ischemic attack (TIA), and cerebral infarction without residual deficits: Secondary | ICD-10-CM

## 2021-04-14 DIAGNOSIS — Z6833 Body mass index (BMI) 33.0-33.9, adult: Secondary | ICD-10-CM

## 2021-04-14 DIAGNOSIS — E669 Obesity, unspecified: Secondary | ICD-10-CM

## 2021-04-14 MED ORDER — BUPROPION HCL ER (XL) 150 MG PO TB24: 150.0000 mg | ORAL_TABLET | Freq: Every day | ORAL | 0 refills | Status: DC

## 2021-04-14 MED ORDER — METFORMIN HCL 500 MG PO TABS: 500.0000 mg | ORAL_TABLET | Freq: Every day | ORAL | 0 refills | Status: DC

## 2021-04-20 ENCOUNTER — Other Ambulatory Visit (INDEPENDENT_AMBULATORY_CARE_PROVIDER_SITE_OTHER): Payer: Self-pay | Admitting: Adult Health

## 2021-04-20 DIAGNOSIS — R7303 Prediabetes: Secondary | ICD-10-CM

## 2021-04-21 NOTE — Telephone Encounter (Signed)
Pt last seen by Dr. Wallace.  

## 2021-04-21 NOTE — Progress Notes (Signed)
Chief Complaint:   OBESITY Abigail Campos is here to discuss her progress with her obesity treatment plan along with follow-up of her obesity related diagnoses.   Today's visit was #: 7 Starting weight: 164 lbs Starting date: 12/26/2020 Today's weight: 151 lbs Today's date: 04/14/2021 Total lbs lost to date: 13 lbs Body mass index is 30.5 kg/m.  Total weight loss percentage to date: -7.93%  Interim History:  Abigail Campos is tolerating her medications.  Denies polyphagia.  Denies cravings.  She is exercising regularly. Current Meal Plan: the Category 1 Plan for 100% of the time.  Current Exercise Plan: Strength training and cardio for 30-60 minutes 3 times per week.  Assessment/Plan:   1. Prediabetes Goal is HgbA1c < 5.7.  Medication: 500 mg daily.    Plan:  She will continue to focus on protein-rich, low simple carbohydrate foods. We reviewed the importance of hydration, regular exercise for stress reduction, and restorative sleep.   Lab Results  Component Value Date   HGBA1C 6.0 11/19/2020   - Refill metFORMIN (GLUCOPHAGE) 500 MG tablet; Take 1 tablet (500 mg total) by mouth daily with breakfast.  Dispense: 30 tablet; Refill: 0  2. Vitamin D deficiency Not at goal. Current vitamin D is 17.70, tested on 11/19/2020. Optimal goal > 50 ng/dL.  She is taking vitamin D 50,000 IU weekly.  Plan: Continue to take prescription Vitamin D @50 ,000 IU every week as prescribed.  Follow-up for routine testing of Vitamin D, at least 2-3 times per year to avoid over-replacement.  3. History of CVA (cerebrovascular accident) Abigail Campos is at high risk for complication. The patient understands monitoring parameters and red flags.   4. Other depression, with emotional eating Controlled. Medication: Wellbutrin XL 150 mg daily.  Plan:  Refill Wellbutrin today, as per below.  Behavior modification techniques were discussed today to help deal with emotional/non-hunger eating behaviors.  - Refill buPROPion  (WELLBUTRIN XL) 150 MG 24 hr tablet; Take 1 tablet (150 mg total) by mouth daily.  Dispense: 30 tablet; Refill: 0  5. At risk for heart disease Due to Abigail Campos's current state of health and medical condition(s), she is at a higher risk for heart disease.  This puts the patient at much greater risk to subsequently develop cardiopulmonary conditions that can significantly affect patient's quality of life in a negative manner.    At least 8 minutes were spent on counseling Abigail Campos about these concerns today. Evidence-based interventions for health behavior change were utilized today including the discussion of self monitoring techniques, problem-solving barriers, and SMART goal setting techniques.  Specifically, regarding patient's less desirable eating habits and patterns, we employed the technique of small changes when Abigail Campos has not been able to fully commit to her prudent nutritional plan.  6. Obesity, current BMI 30.6  Course: Abigail Campos is currently in the action stage of change. As such, her goal is to continue with weight loss efforts.   Nutrition goals: She has agreed to the Category 1 Plan.   Exercise goals: As is.  Behavioral modification strategies: increasing lean protein intake, decreasing simple carbohydrates, increasing vegetables and increasing water intake.  [name] has agreed to follow-up with our clinic in 3 weeks. She was informed of the importance of frequent follow-up visits to maximize her success with intensive lifestyle modifications for her multiple health conditions.   Objective:   Blood pressure 108/68, pulse 76, temperature 98.4 F (36.9 C), temperature source Oral, height 4\' 11"  (1.499 m), weight 151 lb (68.5 kg), SpO2 98 %.  Body mass index is 30.5 kg/m.  General: Cooperative, alert, well developed, in no acute distress. HEENT: Conjunctivae and lids unremarkable. Cardiovascular: Regular rhythm.  Lungs: Normal work of breathing. Neurologic: No focal deficits.   Lab  Results  Component Value Date   CREATININE 0.96 01/22/2021   BUN 14 01/22/2021   NA 139 01/22/2021   K 4.1 01/22/2021   CL 101 01/22/2021   CO2 31 01/22/2021   Lab Results  Component Value Date   ALT 34 (H) 12/26/2020   AST 27 12/26/2020   ALKPHOS 103 12/26/2020   BILITOT 0.4 12/26/2020   Lab Results  Component Value Date   HGBA1C 6.0 11/19/2020   HGBA1C 6.1 (H) 08/31/2020   HGBA1C 5.7 02/14/2020   HGBA1C 6.0 07/12/2019   HGBA1C 6.2 02/27/2019   Lab Results  Component Value Date   TSH 1.310 12/26/2020   Lab Results  Component Value Date   CHOL 118 12/26/2020   HDL 45 12/26/2020   LDLCALC 61 12/26/2020   TRIG 55 12/26/2020   CHOLHDL 2.6 12/26/2020   Lab Results  Component Value Date   WBC 3.8 (L) 01/12/2021   HGB 13.2 01/12/2021   HCT 39.8 01/12/2021   MCV 92.3 01/12/2021   PLT 128 (L) 01/12/2021   Lab Results  Component Value Date   IRON 97 12/26/2020   TIBC 313 12/26/2020   FERRITIN 72 12/26/2020   Attestation Statements:   Reviewed by clinician on day of visit: allergies, medications, problem list, medical history, surgical history, family history, social history, and previous encounter notes.  I, Water quality scientist, CMA, am acting as transcriptionist for Briscoe Deutscher, DO  I have reviewed the above documentation for accuracy and completeness, and I agree with the above. Briscoe Deutscher, DO

## 2021-04-22 ENCOUNTER — Encounter
Payer: No Typology Code available for payment source | Attending: Physical Medicine & Rehabilitation | Admitting: Physical Medicine & Rehabilitation

## 2021-04-22 ENCOUNTER — Encounter: Payer: Self-pay | Admitting: Physical Medicine & Rehabilitation

## 2021-04-22 ENCOUNTER — Other Ambulatory Visit: Payer: Self-pay

## 2021-04-22 VITALS — BP 125/86 | HR 70 | Temp 98.1°F | Ht 59.0 in | Wt 151.0 lb

## 2021-04-22 DIAGNOSIS — I619 Nontraumatic intracerebral hemorrhage, unspecified: Secondary | ICD-10-CM | POA: Diagnosis not present

## 2021-04-22 DIAGNOSIS — G8114 Spastic hemiplegia affecting left nondominant side: Secondary | ICD-10-CM

## 2021-04-22 DIAGNOSIS — I69154 Hemiplegia and hemiparesis following nontraumatic intracerebral hemorrhage affecting left non-dominant side: Secondary | ICD-10-CM | POA: Diagnosis not present

## 2021-04-22 NOTE — Progress Notes (Signed)
Dysport: Procedure Note Patient Name: Abigail Campos DOB: 1971-03-10 MRN: [id number] Date: 04/22/21  Procedure: Botulinum toxin administration Guidance: EMG Diagnosis: Spastic left nondominant IPH Attending: Delice Lesch, MD   Informed consent: Risks, benefits & options of the procedure are explained to the patient (and/or family). The patient elects to proceed with procedure. Risks include but are not limited to weakness, respiratory distress, dry mouth, ptosis, antibody formation, worsening of some areas of function. Benefits include decreased abnormal muscle tone, improved hygiene and positioning, decreased skin breakdown and, in some cases, decreased pain. Options include conservative management with oral antispasticity agents, phenol chemodenervation of nerve or at motor nerve branches. More invasive options include intrathecal balcofen adminstration for appropriate candidates. Surgical options may include tendon lengthening or transposition or, rarely, dorsal rhizotomy.   History/Physical Examination: 50 y.o.  Year old right-handed female with history of hypertension, migraine headaches presents for follow up after right basal ganglia hemorrhage.   Modified [place]: Left 1/4 elbow flexors, 1/4 wrist flexors, 2/4 left finger flexors, 1/4 ankle plantar flexors.  Previous Treatments: Therapy/Range of motion Indication for guidance: Target active muscules  Procedure: Botulinum toxin was mixed with preservative free saline with a dilution of 0.5cc to 100 units. Targeted limb and muscles were identified. The skin was prepped with alcohol swabs and placement of needle tip in targeted muscle was confirmed using appropriate guidance. Prior to injection, positioning of needle tip outside of blood vessel was determined by pulling back on syringe plunger.  MUSCLE UNITS  Left Med biceps: 100 units Left FCR: 100 units Left FCU: 100 units Left FDS: 450 units Left FDP: 450 units  Left Med  gastroc: 150 units   Left Lat gastroc: 150 units   Total units injected: 5573  Complications: None  Plan: RTC 6 weeks Abigail Campos Abigail Campos 10:51 AM

## 2021-04-24 ENCOUNTER — Other Ambulatory Visit (INDEPENDENT_AMBULATORY_CARE_PROVIDER_SITE_OTHER): Payer: Self-pay | Admitting: Family Medicine

## 2021-04-24 DIAGNOSIS — F3289 Other specified depressive episodes: Secondary | ICD-10-CM

## 2021-04-24 DIAGNOSIS — R7303 Prediabetes: Secondary | ICD-10-CM

## 2021-04-24 NOTE — Telephone Encounter (Signed)
Dr.Wallace °

## 2021-04-24 NOTE — Telephone Encounter (Signed)
Dr Juleen China sent in a refill for this medication on 04/14/21. Have you contacted the pharmacy for the prescription?

## 2021-04-29 ENCOUNTER — Other Ambulatory Visit: Payer: Self-pay | Admitting: Physical Medicine & Rehabilitation

## 2021-05-05 ENCOUNTER — Encounter (INDEPENDENT_AMBULATORY_CARE_PROVIDER_SITE_OTHER): Payer: Self-pay

## 2021-05-05 ENCOUNTER — Other Ambulatory Visit: Payer: Self-pay | Admitting: Physical Medicine & Rehabilitation

## 2021-05-05 ENCOUNTER — Encounter: Payer: Self-pay | Admitting: Internal Medicine

## 2021-05-06 MED ORDER — ZALEPLON 10 MG PO CAPS: 10.0000 mg | ORAL_CAPSULE | Freq: Every evening | ORAL | 1 refills | Status: DC | PRN

## 2021-05-06 NOTE — Addendum Note (Signed)
Addended byDamita Dunnings D on: 05/06/2021 08:04 AM   Modules accepted: Orders

## 2021-05-07 ENCOUNTER — Other Ambulatory Visit: Payer: Self-pay | Admitting: Physical Medicine & Rehabilitation

## 2021-05-07 ENCOUNTER — Other Ambulatory Visit: Payer: Self-pay | Admitting: Internal Medicine

## 2021-05-09 ENCOUNTER — Other Ambulatory Visit: Payer: Self-pay | Admitting: *Deleted

## 2021-05-09 MED ORDER — GABAPENTIN 300 MG PO CAPS: ORAL_CAPSULE | ORAL | 1 refills | Status: DC

## 2021-05-13 ENCOUNTER — Other Ambulatory Visit: Payer: Self-pay

## 2021-05-13 ENCOUNTER — Ambulatory Visit (INDEPENDENT_AMBULATORY_CARE_PROVIDER_SITE_OTHER): Payer: No Typology Code available for payment source | Admitting: Adult Health

## 2021-05-13 VITALS — BP 134/91 | HR 94 | Temp 97.6°F | Ht 59.0 in | Wt 152.0 lb

## 2021-05-13 DIAGNOSIS — E669 Obesity, unspecified: Secondary | ICD-10-CM

## 2021-05-13 DIAGNOSIS — R7303 Prediabetes: Secondary | ICD-10-CM

## 2021-05-13 DIAGNOSIS — F3289 Other specified depressive episodes: Secondary | ICD-10-CM | POA: Diagnosis not present

## 2021-05-13 DIAGNOSIS — Z9189 Other specified personal risk factors, not elsewhere classified: Secondary | ICD-10-CM | POA: Diagnosis not present

## 2021-05-13 DIAGNOSIS — Z6830 Body mass index (BMI) 30.0-30.9, adult: Secondary | ICD-10-CM

## 2021-05-13 MED ORDER — BUPROPION HCL ER (XL) 300 MG PO TB24: 300.0000 mg | ORAL_TABLET | Freq: Every day | ORAL | 0 refills | Status: DC

## 2021-05-15 NOTE — Progress Notes (Signed)
Chief Complaint:   OBESITY Abigail Campos is here to discuss her progress with her obesity treatment plan along with follow-up of her obesity related diagnoses. Abigail Campos is on the Category 1 Plan and states she is following her eating plan approximately 50% of the time. Abigail Campos states she is going to the gym 30 minutes 3 times per week.  Today's visit was #: 8 Starting weight: 164 lbs Starting date: 12/26/2020 Today's weight: 152 lbs Today's date: 05/13/2021 Total lbs lost to date: 12 Total lbs lost since last in-office visit: 0  Interim History: Over the last several weeks, Abigail Campos has lost two cousins: 1st cousin, age 28- Myocardial infarction 2nd cousin, age 54- mental health disorder, shot by police.  She has been eating off plan and consuming more wine due to grief.   Subjective:   1. Pre-diabetes Abigail Campos 11/19/2020 A1c was 6.0. She is on Metformin 500 mg QD.  Lab Results  Component Value Date   HGBA1C 6.0 11/19/2020   2. Other depression, with emotional eating Abigail Campos feels that cravings have increased around 1600 each day.  She will take Bupropion XL 150 mg each morning- been on this dose for > 1 month. She denies history of seizures.  3. At risk for diabetes mellitus Abigail Campos is at higher than average risk for developing diabetes due to obesity and pre-diabetes.   Assessment/Plan:   1. Pre-diabetes Abigail Campos will continue to work on weight loss, exercise, and decreasing simple carbohydrates to help decrease the risk of diabetes.  -Continue Metformin and increase protein.  2. Other depression, with emotional eating Behavior modification techniques were discussed today to help Abigail Campos deal with her emotional/non-hunger eating behaviors.  Orders and follow up as documented in patient record.  -Increase Bupropion XL to 300 mg QD, as prescribed below. - buPROPion (WELLBUTRIN XL) 300 MG 24 hr tablet; Take 1 tablet (300 mg total) by mouth daily.  Dispense: 30 tablet; Refill: 0  3. At risk  for diabetes mellitus Abigail Campos was given approximately 15 minutes of diabetes education and counseling today. We discussed intensive lifestyle modifications today with an emphasis on weight loss as well as increasing exercise and decreasing simple carbohydrates in her diet. We also reviewed medication options with an emphasis on risk versus benefit of those discussed.   Repetitive spaced learning was employed today to elicit superior memory formation and behavioral change.  4. Class 1 obesity with serious comorbidity and body mass index (BMI) of 30.0 to 30.9 in adult, unspecified obesity type Abigail Campos is currently in the action stage of change. As such, her goal is to continue with weight loss efforts. She has agreed to the Category 1 Plan and the [place] for 30 days.   Handout: Pescatarian Meal Plan Check fasting labs at next OV.  Exercise goals: As is  Behavioral modification strategies: increasing lean protein intake, decreasing simple carbohydrates, increasing water intake, decreasing liquid calories, decreasing alcohol intake, meal planning and cooking strategies and planning for success.  Abigail Campos has agreed to follow-up with our clinic in 3 weeks, fasting. She was informed of the importance of frequent follow-up visits to maximize her success with intensive lifestyle modifications for her multiple health conditions.   Objective:   Blood pressure (!) 134/91, pulse 94, temperature 97.6 F (36.4 C), height 4\' 11"  (1.499 m), weight 152 lb (68.9 kg), SpO2 96 %. Body mass index is 30.7 kg/m.  General: Cooperative, alert, well developed, in no acute distress. HEENT: Conjunctivae and lids unremarkable. Cardiovascular: Regular rhythm.  Lungs: Normal work of breathing. Neurologic: No focal deficits.   Lab Results  Component Value Date   CREATININE 0.96 01/22/2021   BUN 14 01/22/2021   NA 139 01/22/2021   K 4.1 01/22/2021   CL 101 01/22/2021   CO2 31 01/22/2021   Lab Results   Component Value Date   ALT 34 (H) 12/26/2020   AST 27 12/26/2020   ALKPHOS 103 12/26/2020   BILITOT 0.4 12/26/2020   Lab Results  Component Value Date   HGBA1C 6.0 11/19/2020   HGBA1C 6.1 (H) 08/31/2020   HGBA1C 5.7 02/14/2020   HGBA1C 6.0 07/12/2019   HGBA1C 6.2 02/27/2019   No results found for: INSULIN Lab Results  Component Value Date   TSH 1.310 12/26/2020   Lab Results  Component Value Date   CHOL 118 12/26/2020   HDL 45 12/26/2020   LDLCALC 61 12/26/2020   TRIG 55 12/26/2020   CHOLHDL 2.6 12/26/2020   Lab Results  Component Value Date   WBC 3.8 (L) 01/12/2021   HGB 13.2 01/12/2021   HCT 39.8 01/12/2021   MCV 92.3 01/12/2021   PLT 128 (L) 01/12/2021   Lab Results  Component Value Date   IRON 97 12/26/2020   TIBC 313 12/26/2020   FERRITIN 72 12/26/2020    Attestation Statements:   Reviewed by clinician on day of visit: allergies, medications, problem list, medical history, surgical history, family history, social history, and previous encounter notes.  Coral Ceo, CMA, am acting as transcriptionist for Mina Marble, NP.  I have reviewed the above documentation for accuracy and completeness, and I agree with the above. -  [place]olan [place] d. Zabdi Mis, NP-C

## 2021-05-20 ENCOUNTER — Encounter: Payer: Self-pay | Admitting: Internal Medicine

## 2021-05-20 ENCOUNTER — Ambulatory Visit (INDEPENDENT_AMBULATORY_CARE_PROVIDER_SITE_OTHER): Payer: No Typology Code available for payment source | Admitting: Internal Medicine

## 2021-05-20 ENCOUNTER — Ambulatory Visit (HOSPITAL_BASED_OUTPATIENT_CLINIC_OR_DEPARTMENT_OTHER)
Admission: RE | Admit: 2021-05-20 | Discharge: 2021-05-20 | Disposition: A | Discharge status: Home / Self Care | Payer: No Typology Code available for payment source | Source: Ambulatory Visit | Attending: Internal Medicine | Admitting: Internal Medicine

## 2021-05-20 ENCOUNTER — Other Ambulatory Visit: Payer: Self-pay

## 2021-05-20 VITALS — BP 130/84 | HR 94 | Temp 98.0°F | Resp 16 | Ht 59.0 in | Wt 160.0 lb

## 2021-05-20 DIAGNOSIS — U071 COVID-19: Secondary | ICD-10-CM | POA: Diagnosis not present

## 2021-05-20 DIAGNOSIS — J1282 Pneumonia due to coronavirus disease 2019: Secondary | ICD-10-CM

## 2021-05-20 DIAGNOSIS — Z1159 Encounter for screening for other viral diseases: Secondary | ICD-10-CM | POA: Diagnosis not present

## 2021-05-20 DIAGNOSIS — I10 Essential (primary) hypertension: Secondary | ICD-10-CM

## 2021-05-20 DIAGNOSIS — J189 Pneumonia, unspecified organism: Secondary | ICD-10-CM | POA: Diagnosis not present

## 2021-05-20 DIAGNOSIS — R739 Hyperglycemia, unspecified: Secondary | ICD-10-CM | POA: Diagnosis not present

## 2021-05-20 LAB — BASIC METABOLIC PANEL
BUN: 11 mg/dL (ref 6–23)
CO2: 26 mEq/L (ref 19–32)
Calcium: 9.4 mg/dL (ref 8.4–10.5)
Chloride: 101 mEq/L (ref 96–112)
Creatinine, Ser: 0.94 mg/dL (ref 0.40–1.20)
GFR: 72.24 mL/min (ref >60.00)
Glucose, Bld: 143 mg/dL — ABNORMAL HIGH (ref 70–99)
Potassium: 3.8 mEq/L (ref 3.5–5.1)
Sodium: 138 mEq/L (ref 135–145)

## 2021-05-20 LAB — HEMOGLOBIN A1C: Hgb A1c MFr Bld: 5.8 % (ref 4.6–6.5)

## 2021-05-20 IMAGING — DX DG CHEST 2V
2 series · 2 of 2 positions shown · non-contrast
Comparison: [DATE]

CLINICAL DATA: [YQ] pneumonia follow-up

EXAM:
CHEST - 2 VIEW

[chest pa]
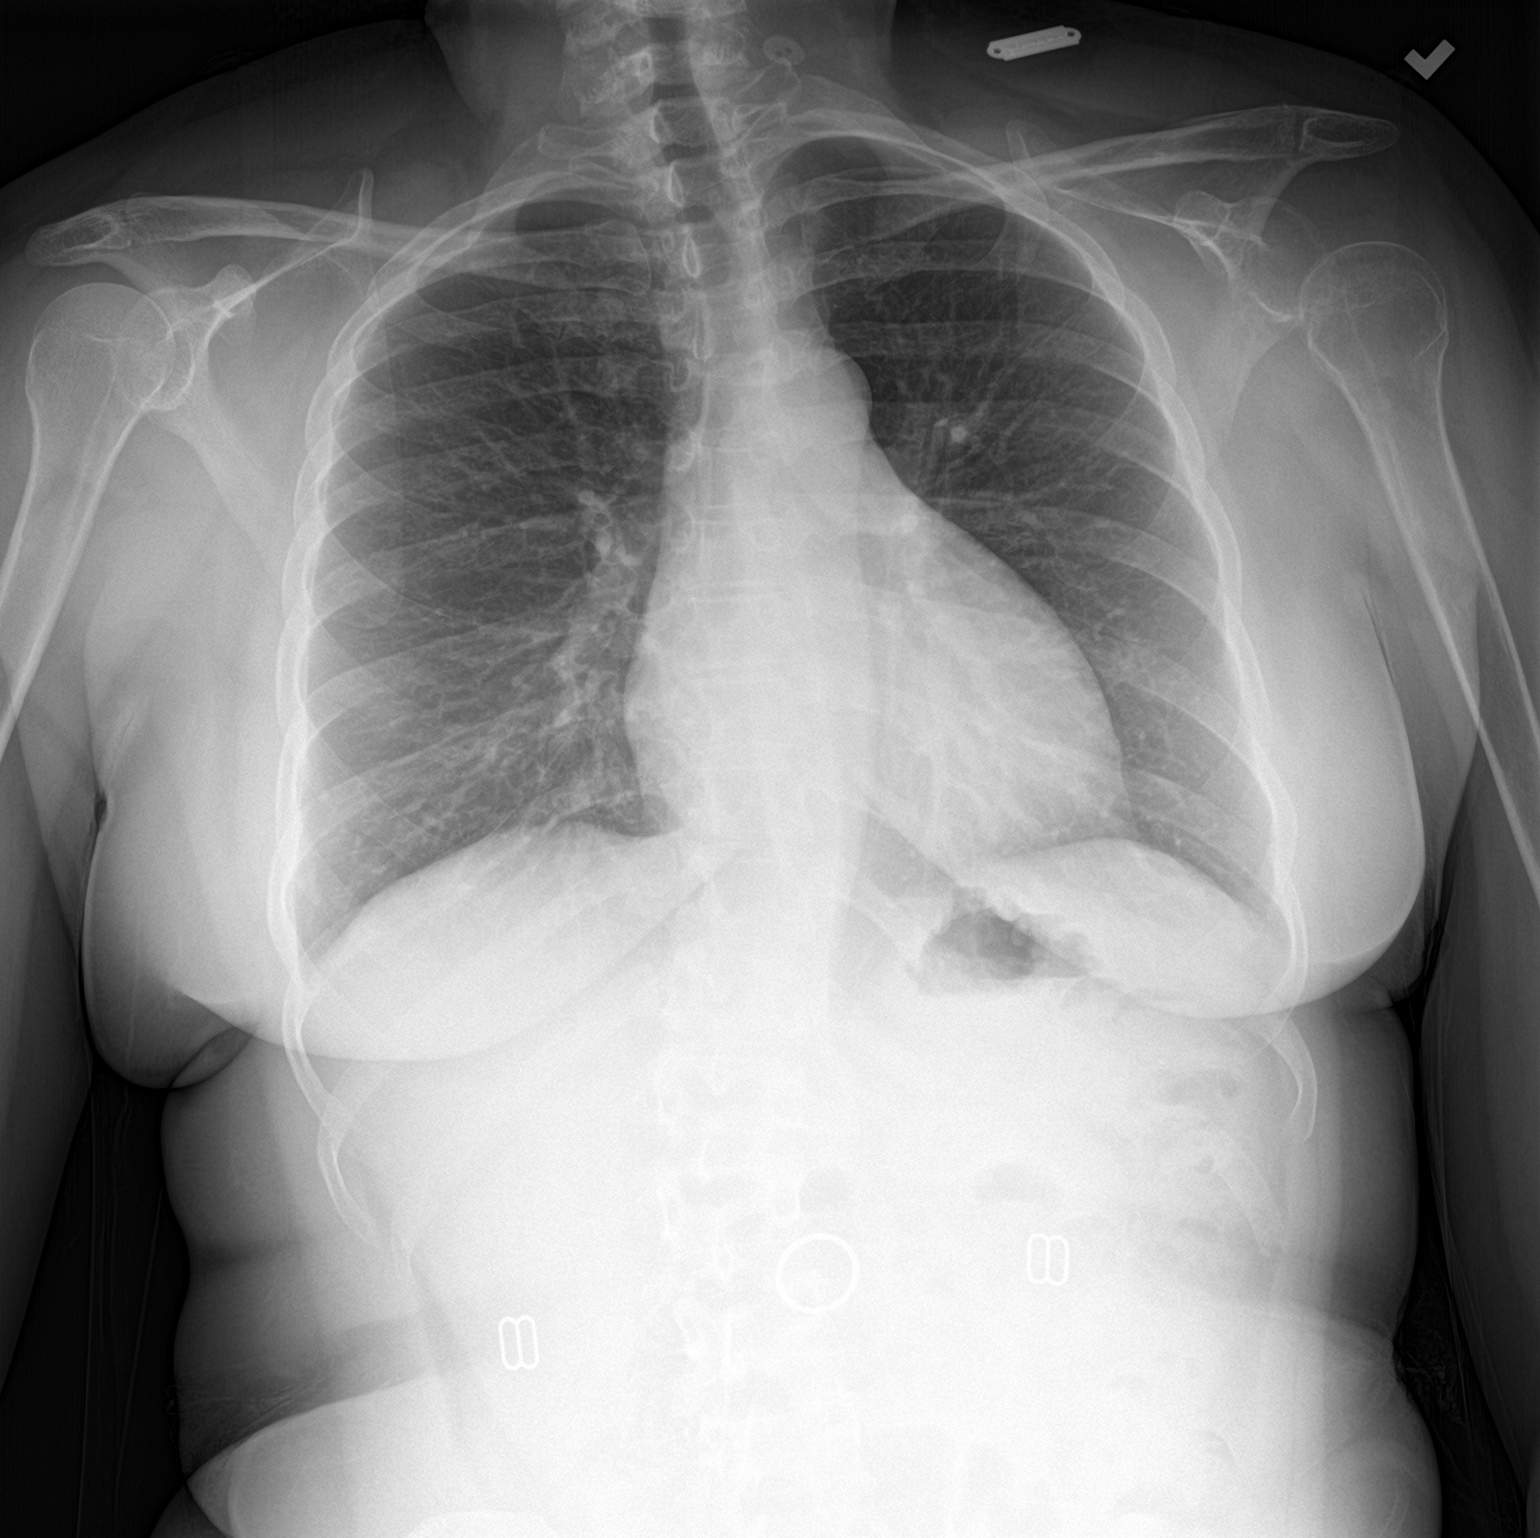

[chest lat]
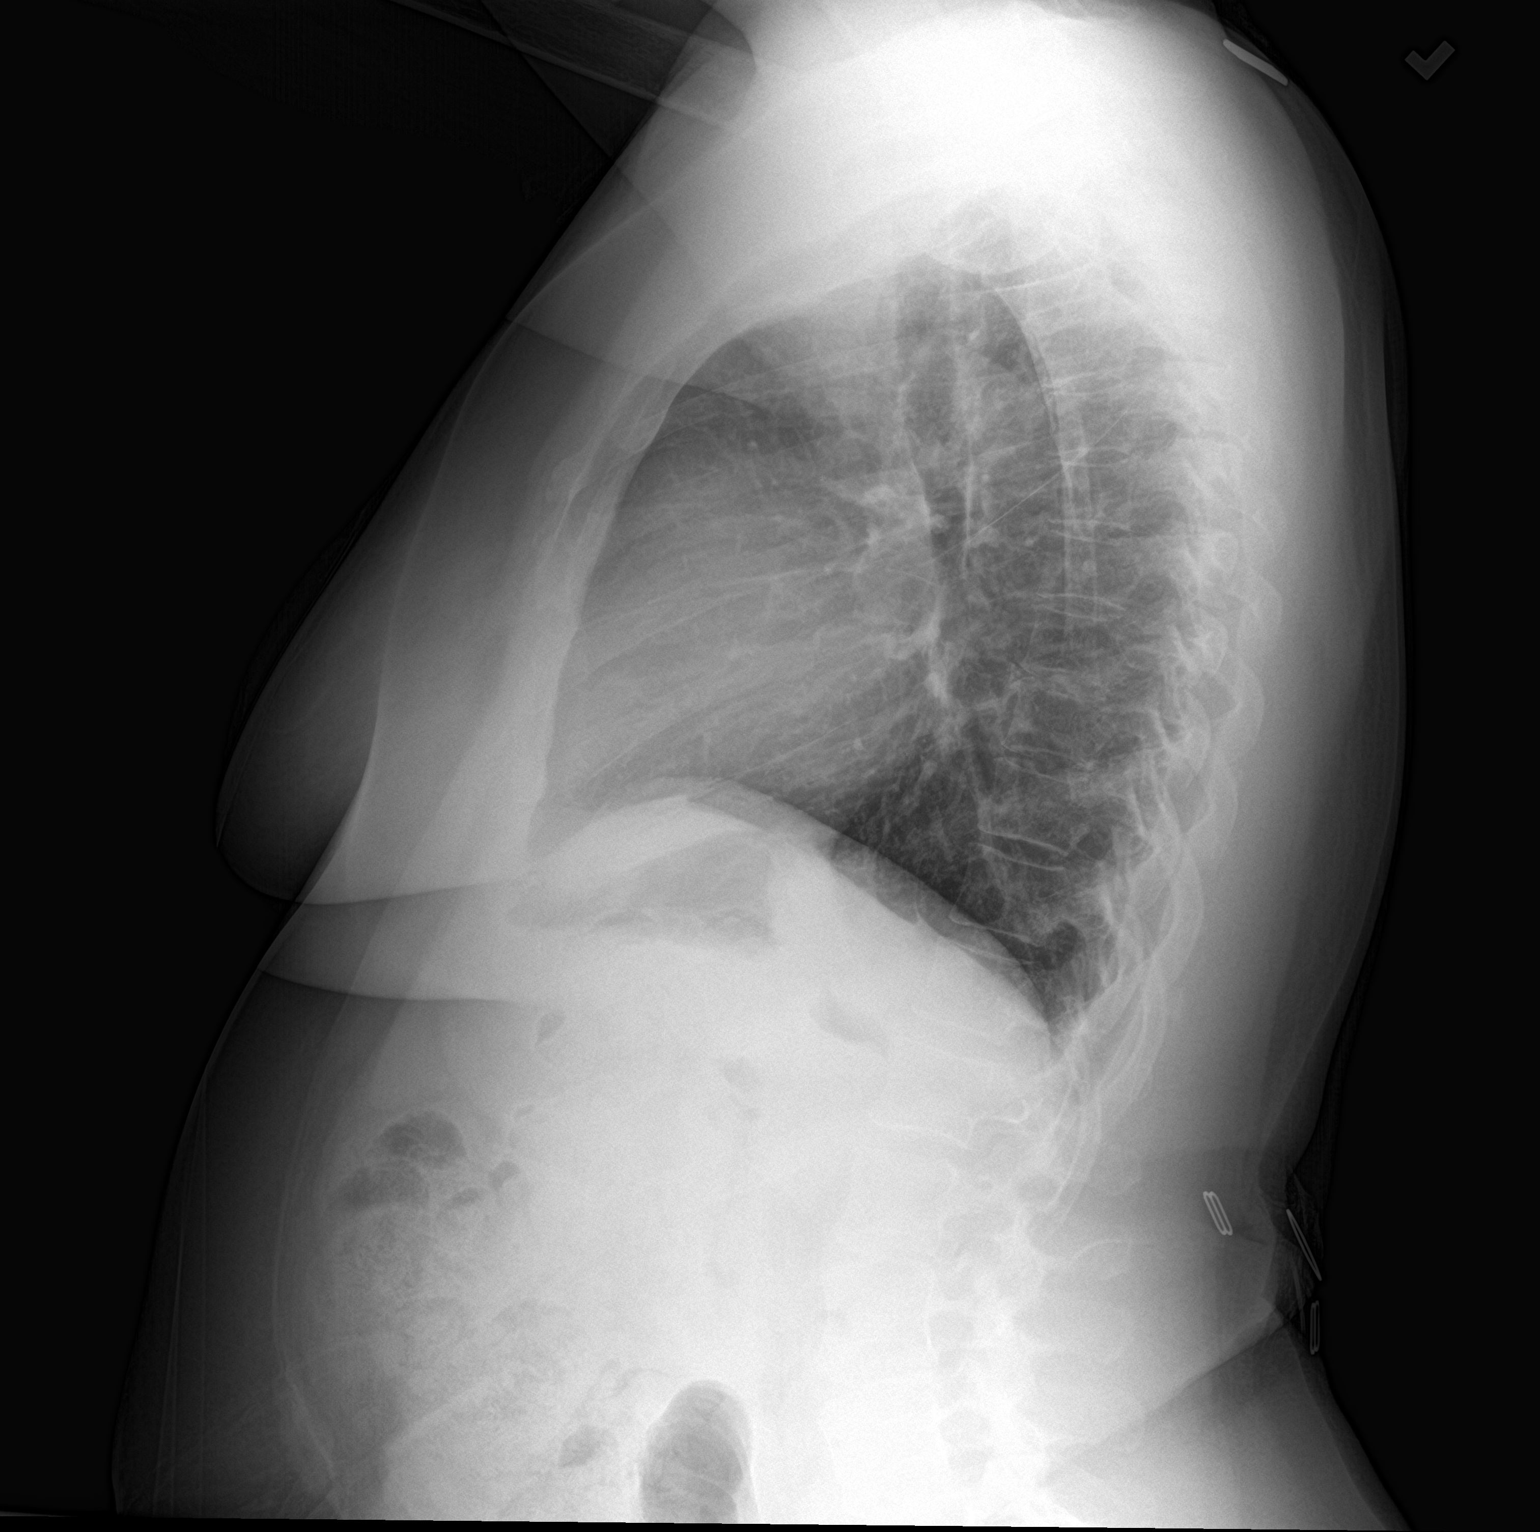

[2 of 2 positions shown; findings below may reference images not displayed]

FINDINGS: The heart size and mediastinal contours are within normal limits.
Both lungs are clear. The visualized skeletal structures are
unremarkable.
IMPRESSION: No active cardiopulmonary disease.

## 2021-05-20 MED ORDER — FUROSEMIDE 20 MG PO TABS: 20.0000 mg | ORAL_TABLET | Freq: Every day | ORAL | 3 refills | Status: DC

## 2021-05-20 NOTE — Patient Instructions (Signed)
   GO TO THE LAB : Get the blood work     [place], [place] Come back for a physical exam by December 2022  STOP BY THE FIRST FLOOR:  get the XR

## 2021-05-20 NOTE — Progress Notes (Signed)
Subjective:    Patient ID: Abigail Campos, female    DOB: 1971/02/28, 50 y.o.   MRN: [id number]  DOS:  05/20/2021 Type of visit - description: Routine visit  Since the last office visit she is doing well.  Had COVID-pneumonia, currently asymptomatic. Denies chest pain, difficulty breathing. No fever or chills.  Obesity: Now follow-up at the wellness center, very happy with their services.     HTN: Seems well controlled, she is concerned about the number of medications she takes   Review of Systems See above   Past Medical History:  Diagnosis Date  . Anemia   . Anxiety and depression   . Back pain   . Essential hypertension 10/19/2007   03-2010: metoprolol changed to bystolic (was not feeling well on it, no specific allergy or reaction)    . Food allergy   . Hemiplegia ([place])   . Hyperlipemia   . Hypertension   . Joint pain   . Migraine   . Neuromuscular disorder ([place])    left sided hemiplegia - has brace for l leg and walks with a cane  . Obesity   . Ovarian cancer ([place])   . Prediabetes   . Sleep apnea    not wearing c-pap yet  . Sleep apnea   . Stroke ([place]) 2017  . Vitamin D deficiency     Past Surgical History:  Procedure Laterality Date  . ABDOMINAL HYSTERECTOMY  11-14-07   no oophorectomy per surgical report  . CESAREAN SECTION     [id number]  . INGUINAL HERNIA REPAIR     2004    Allergies as of 05/20/2021      Reactions   Bee Venom Anaphylaxis   Peanut-containing Drug Products Anaphylaxis   Hydrocodone Hives   Generalize itching w/o rash   Isovue [iopamidol] Hives, Itching   Pt broke out with one facial hive.  Had itching on her right upper ant and posterior arm w/o evidence of hives.  Pt given water and 25mg  benadryl po.  We observed pt for 30 minutes before d/c.  J. Bohm     Ambien [zolpidem Tartrate] Other (See Comments)   forgetfulness   Aspirin Swelling   Reports blisters w/ ASA but pt reports is ok w/ Motrin, Advil, naproxen   Latex Hives       Medication List       Accurate as of May 20, 2021 11:59 PM. If you have any questions, ask your nurse or doctor.        STOP taking these medications   Vitamin D (Ergocalciferol) 1.25 MG (50000 UNIT) Caps capsule Commonly known as: DRISDOL Stopped by: Kathlene November, MD     TAKE these medications   atorvastatin 20 MG tablet Commonly known as: LIPITOR Take 1 tablet (20 mg total) by mouth daily.   baclofen 10 MG tablet Commonly known as: LIORESAL TAKE 3 TABLETS (30 MG TOTAL) BY MOUTH 3 (THREE) TIMES DAILY.   buPROPion 300 MG 24 hr tablet Commonly known as: WELLBUTRIN XL Take 1 tablet (300 mg total) by mouth daily.   carvedilol 6.25 MG tablet Commonly known as: COREG Take 1 tablet (6.25 mg total) by mouth 2 (two) times daily with a meal.   clopidogrel 75 MG tablet Commonly known as: PLAVIX Take 1 tablet (75 mg total) by mouth daily.   escitalopram 20 MG tablet Commonly known as: LEXAPRO Take 1 tablet (20 mg total) by mouth daily.   furosemide 20 MG tablet Commonly known as:  LASIX Take 1 tablet (20 mg total) by mouth daily.   gabapentin 300 MG capsule Commonly known as: NEURONTIN TAKE 1 CAPSULE BY MOUTH THREE TIMES A DAY   lisinopril 2.5 MG tablet Commonly known as: ZESTRIL Take 1 tablet (2.5 mg total) by mouth daily.   metFORMIN 500 MG tablet Commonly known as: GLUCOPHAGE Take 1 tablet (500 mg total) by mouth daily with breakfast.   traZODone 100 MG tablet Commonly known as: DESYREL Take 1 tablet (100 mg total) by mouth at bedtime as needed for sleep.   VITAMIN B 12 PO Take 1 tablet by mouth daily.   zaleplon 10 MG capsule Commonly known as: SONATA Take 1 capsule (10 mg total) by mouth at bedtime as needed for sleep.          Objective:   Physical Exam BP 130/84 (BP Location: Right Arm, Patient Position: Sitting, Cuff Size: Small)   Pulse 94   Temp 98 F (36.7 C) (Oral)   Resp 16   Ht 4\' 11"  (1.499 m)   Wt 160 lb (72.6 kg)   SpO2 96%   BMI  32.32 kg/m   General:   Well developed, NAD, BMI noted. HEENT:  Normocephalic . Face symmetric, atraumatic Lungs:  CTA B Normal respiratory effort, no intercostal retractions, no accessory muscle use. Heart: RRR,  no murmur.  Lower extremities: no pretibial edema bilaterally  Skin: Not pale. Not jaundice Neurologic:  alert & oriented X3.  Speech normal, gait at baseline, left spasticity at baseline as well Psych--  Cognition and judgment appear intact.  Cooperative with normal attention span and concentration.  Behavior appropriate. No anxious or depressed appearing. She seems in good spirits     Assessment    Assessment   Prediabetes: A1c 6.0 (April 2017)  HTN Hyperlipidemia Depression, insomnia GERD Stroke 03-2016: [place] (L)  spastic hemiplegia   ICH, right basilar ganglia due to HTN emergency, + encephalopathy, + dysphagia, aphasia, dysarthria Residual L spasticity.  CTA neck 03-2016 neg CTA head 10-17 (-) aneurysm Headaches , migraines dx after 2nd pregnancy, (-) CT head 2014 and 2015 Cough, persisting, saw Dr. Melvyn Novas 11-2015 OSA COVID infection 01-2021   PLAN COVID-pneumonia: She seems to be fully recuperated, check a chest x-ray. Prediabetes: On metformin, Check A1c. HTN: Currently on carvedilol, lisinopril, Lasix.  All at low doses, with talk about possible consolidation but BP is good, she feels well,so we agreed no change.  Check a BMP, RF Lasix Anxiety, depression insomnia: Continue Wellbutrin, Lexapro, trazodone zaleplon.  She seems to be doing very well at this point. Preventive care: Hep C screening RTC 6 months CPX   This visit occurred during the SARS-CoV-2 public health emergency.  Safety protocols were in place, including screening questions prior to the visit, additional usage of staff PPE, and extensive cleaning of exam room while observing appropriate contact time as indicated for disinfecting solutions.

## 2021-05-21 ENCOUNTER — Encounter: Payer: Self-pay | Admitting: Internal Medicine

## 2021-05-21 LAB — HEPATITIS C ANTIBODY
Hepatitis C Ab: NONREACTIVE
SIGNAL TO CUT-OFF: 0.08 (ref <1.00)

## 2021-05-21 NOTE — Assessment & Plan Note (Signed)
COVID-pneumonia: She seems to be fully recuperated, check a chest x-ray. Prediabetes: On metformin, Check A1c. HTN: Currently on carvedilol, lisinopril, Lasix.  All at low doses, with talk about possible consolidation but BP is good, she feels well,so we agreed no change.  Check a BMP, RF Lasix Anxiety, depression insomnia: Continue Wellbutrin, Lexapro, trazodone zaleplon.  She seems to be doing very well at this point. Preventive care: Hep C screening RTC 6 months CPX

## 2021-05-24 ENCOUNTER — Encounter (INDEPENDENT_AMBULATORY_CARE_PROVIDER_SITE_OTHER): Payer: Self-pay | Admitting: Family Medicine

## 2021-05-29 ENCOUNTER — Encounter (INDEPENDENT_AMBULATORY_CARE_PROVIDER_SITE_OTHER): Payer: Self-pay | Admitting: Family Medicine

## 2021-05-29 ENCOUNTER — Ambulatory Visit (INDEPENDENT_AMBULATORY_CARE_PROVIDER_SITE_OTHER): Payer: No Typology Code available for payment source | Admitting: Family Medicine

## 2021-05-29 ENCOUNTER — Other Ambulatory Visit: Payer: Self-pay

## 2021-05-29 ENCOUNTER — Other Ambulatory Visit (INDEPENDENT_AMBULATORY_CARE_PROVIDER_SITE_OTHER): Payer: Self-pay | Admitting: Family Medicine

## 2021-05-29 VITALS — BP 130/87 | HR 82 | Temp 97.6°F | Ht 59.0 in | Wt 150.0 lb

## 2021-05-29 DIAGNOSIS — Z6833 Body mass index (BMI) 33.0-33.9, adult: Secondary | ICD-10-CM | POA: Diagnosis not present

## 2021-05-29 DIAGNOSIS — R7303 Prediabetes: Secondary | ICD-10-CM

## 2021-05-29 DIAGNOSIS — F3289 Other specified depressive episodes: Secondary | ICD-10-CM | POA: Diagnosis not present

## 2021-05-29 DIAGNOSIS — E669 Obesity, unspecified: Secondary | ICD-10-CM | POA: Diagnosis not present

## 2021-05-29 MED ORDER — METFORMIN HCL 500 MG PO TABS: 500.0000 mg | ORAL_TABLET | Freq: Every day | ORAL | 0 refills | Status: DC

## 2021-05-29 MED ORDER — BUPROPION HCL ER (XL) 300 MG PO TB24: 300.0000 mg | ORAL_TABLET | Freq: Every day | ORAL | 0 refills | Status: DC

## 2021-06-03 ENCOUNTER — Ambulatory Visit: Payer: Medicare Other | Admitting: Physical Medicine & Rehabilitation

## 2021-06-04 ENCOUNTER — Ambulatory Visit (INDEPENDENT_AMBULATORY_CARE_PROVIDER_SITE_OTHER): Payer: No Typology Code available for payment source | Admitting: Adult Health

## 2021-06-09 NOTE — Progress Notes (Signed)
Chief Complaint:   OBESITY Abigail Campos is here to discuss her progress with her obesity treatment plan along with follow-up of her obesity related diagnoses. Abigail Campos is on the [employer] and states she is following her eating plan approximately 100% of the time. Abigail Campos states she is not currently exercising.  Today's visit was #: 9 Starting weight: 164 lbs Starting date: 12/26/2020 Today's weight: 150 lbs Today's date: 05/29/2021 Total lbs lost to date: 14 Total lbs lost since last in-office visit: 2  Interim History: Abigail Campos changed to Pescatarian meal plana at last OV with Dr. Juleen China. Loves it. She reports hunger is controlled. She denies cravings.  Assessment/Plan:   1. Other depression, with emotional eating Controlled. Medication: Wellbutrin 300 mg. Abigail Campos's Wellbutrin was increased from 150 mg to 300 mg at last OV with [facility]. She is tolerating it well with no side effects. She has stayed away from wine and cakes/snacks.  Plan: Behavior modification techniques were discussed today to help deal with emotional/non-hunger eating behaviors. Call pastor at church for counseling appt. Walk QD, if possible.  Refill- buPROPion (WELLBUTRIN XL) 300 MG 24 hr tablet; Take 1 tablet (300 mg total) by mouth daily.  Dispense: 30 tablet; Refill: 0  2. Pre-diabetes Not at goal. Goal is HgbA1c < 5.7.  Medication: Metformin 500 mg.    Plan:  She will continue to focus on protein-rich, low simple carbohydrate foods. We reviewed the importance of hydration, regular exercise for stress reduction, and restorative sleep.   Lab Results  Component Value Date   HGBA1C 5.8 05/20/2021   No results found for: INSULIN  Refill- metFORMIN (GLUCOPHAGE) 500 MG tablet; Take 1 tablet (500 mg total) by mouth daily with breakfast.  Dispense: 30 tablet; Refill: 0  3. Obesity with current BMI of 30.4  Abigail Campos is currently in the action stage of change. As such, her goal is to continue with weight loss efforts. She  has agreed to the [employer].   Exercise goals:  As is  Behavioral modification strategies: meal planning and cooking strategies.  Abigail Campos has agreed to follow-up with our clinic in 4 weeks. She was informed of the importance of frequent follow-up visits to maximize her success with intensive lifestyle modifications for her multiple health conditions.   Objective:   Blood pressure 130/87, pulse 82, temperature 97.6 F (36.4 C), height 4\' 11"  (1.499 m), weight 150 lb (68 kg), SpO2 98 %. Body mass index is 30.3 kg/m.  General: Cooperative, alert, well developed, in no acute distress. HEENT: Conjunctivae and lids unremarkable. Cardiovascular: Regular rhythm.  Lungs: Normal work of breathing. Neurologic: No focal deficits.   Lab Results  Component Value Date   CREATININE 0.94 05/20/2021   BUN 11 05/20/2021   NA 138 05/20/2021   K 3.8 05/20/2021   CL 101 05/20/2021   CO2 26 05/20/2021   Lab Results  Component Value Date   ALT 34 (H) 12/26/2020   AST 27 12/26/2020   ALKPHOS 103 12/26/2020   BILITOT 0.4 12/26/2020   Lab Results  Component Value Date   HGBA1C 5.8 05/20/2021   HGBA1C 6.0 11/19/2020   HGBA1C 6.1 (H) 08/31/2020   HGBA1C 5.7 02/14/2020   HGBA1C 6.0 07/12/2019   No results found for: INSULIN Lab Results  Component Value Date   TSH 1.310 12/26/2020   Lab Results  Component Value Date   CHOL 118 12/26/2020   HDL 45 12/26/2020   LDLCALC 61 12/26/2020   TRIG 55 12/26/2020  CHOLHDL 2.6 12/26/2020   Lab Results  Component Value Date   WBC 3.8 (L) 01/12/2021   HGB 13.2 01/12/2021   HCT 39.8 01/12/2021   MCV 92.3 01/12/2021   PLT 128 (L) 01/12/2021   Lab Results  Component Value Date   IRON 97 12/26/2020   TIBC 313 12/26/2020   FERRITIN 72 12/26/2020    Obesity Behavioral Intervention:   Approximately 15 minutes were spent on the discussion below.  ASK: We discussed the diagnosis of obesity with Abigail Campos today and Abigail Campos agreed to give [place]  permission to discuss obesity behavioral modification therapy today.  ASSESS: Abigail Campos has the diagnosis of obesity and her BMI today is 30.4. Abigail Campos is in the action stage of change.   ADVISE: Abigail Campos was educated on the multiple health risks of obesity as well as the benefit of weight loss to improve her health. She was advised of the need for long term treatment and the importance of lifestyle modifications to improve her current health and to decrease her risk of future health problems.  AGREE: Multiple dietary modification options and treatment options were discussed and Abigail Campos agreed to follow the recommendations documented in the above note.  ARRANGE: Abigail Campos was educated on the importance of frequent visits to treat obesity as outlined per CMS and USPSTF guidelines and agreed to schedule her next follow up appointment today.  Attestation Statements:   Reviewed by clinician on day of visit: allergies, medications, problem list, medical history, surgical history, family history, social history, and previous encounter notes.  Coral Ceo, CMA, am acting as transcriptionist for [employer], DO.  I have reviewed the above documentation for accuracy and completeness, and I agree with the above. Marjory Sneddon, D.O.  The [place] was signed into law in 2016 which includes the topic of electronic health records.  This provides immediate access to information in MyChart.  This includes consultation notes, operative notes, office notes, lab results and pathology reports.  If you have any questions about what you read please let us know at your next visit so we can discuss your concerns and take corrective action if need be.  We are right here with you.

## 2021-06-11 ENCOUNTER — Ambulatory Visit (INDEPENDENT_AMBULATORY_CARE_PROVIDER_SITE_OTHER): Payer: No Typology Code available for payment source | Admitting: Neurology

## 2021-06-11 ENCOUNTER — Encounter: Payer: Self-pay | Admitting: Neurology

## 2021-06-11 VITALS — BP 116/76 | HR 86 | Ht <= 58 in | Wt 150.8 lb

## 2021-06-11 DIAGNOSIS — G3184 Mild cognitive impairment, so stated: Secondary | ICD-10-CM | POA: Diagnosis not present

## 2021-06-11 DIAGNOSIS — G8114 Spastic hemiplegia affecting left nondominant side: Secondary | ICD-10-CM | POA: Diagnosis not present

## 2021-06-11 DIAGNOSIS — Z8673 Personal history of transient ischemic attack (TIA), and cerebral infarction without residual deficits: Secondary | ICD-10-CM | POA: Diagnosis not present

## 2021-06-11 DIAGNOSIS — R413 Other amnesia: Secondary | ICD-10-CM | POA: Diagnosis not present

## 2021-06-11 DIAGNOSIS — I619 Nontraumatic intracerebral hemorrhage, unspecified: Secondary | ICD-10-CM | POA: Diagnosis not present

## 2021-06-11 NOTE — Progress Notes (Signed)
[facility] [address] [place]. [place] [address] [phone]       OFFICE CONSULT NOTE  [name] Date of Birth:  03/12/1971 Medical Record Number:  [id number]   Referring MD:  Kathlene November  Reason for Referral: Memory loss  HPI: [name] is a pleasant 49 year old African-American lady seen today for consultation visit for memory loss.  History is obtained from the patient and review of electronic medical records and I have personally reviewed available imaging films in PACS.  She has past medical history of hypertension, hyperlipidemia, right basal ganglia intracerebral hemorrhage in April 2017 with residual spastic left hemiplegia, depression, sleep apnea, anemia.  Patient states that she is noticing increasing memory loss and short-term memory difficulties for the last 3 years.  She is to initially managed to write things down and get by but of late this has become difficult.  She is often forgotten the way to drive to her mother's house and has recently given up driving as a result.  She is fairly independent in all activities of daily living and can feed dress and bathe herself.  She does have history of anxiety depression and has noted increased mood swings since her menopause few years.  She is on Wellbutrin and recently dose was increased to 300 mg daily and she also takes Lexapro.  She denies any headache, seizures, significant head injury with loss of consciousness.  There is no family history of anybody with Alzheimer's or other dementias.  Patient is able to ambulate with a cane she did not fall.  She remains on Plavix for stroke prevention this complain of bruising but no bleeding.  Blood pressures well controlled today it is 116/76.  Recent lab work for hemoglobin A1c on 05/20/2021 was satisfactory at 5.86 and LDL cholesterol on 12/26/2020 was 61 mg percent.  She was last seen in office for follow-up of on 07/04/2018 and then lost to follow-up.  ROS:    14 system review of systems is positive for memory loss, forgetfulness, confusion, weakness, gait difficulty, bruising and all other systems negative  PMH:  Past Medical History:  Diagnosis Date   Anemia    Anxiety and depression    Back pain    Essential hypertension 10/19/2007   03-2010: metoprolol changed to bystolic (was not feeling well on it, no specific allergy or reaction)     Food allergy    Hemiplegia (HCC)    Hyperlipemia    Hypertension    Joint pain    Migraine    Neuromuscular disorder ([place])    left sided hemiplegia - has brace for l leg and walks with a cane   Obesity    Ovarian cancer ([place])    Prediabetes    Sleep apnea    not wearing c-pap yet   Sleep apnea    Stroke ([place]) 2017   Vitamin D deficiency     Social History:  Social History   Socioeconomic History   Marital status: Married    Spouse name: [name]   Number of children: 2   Years of education: Not on file   Highest education level: Not on file  Occupational History   Occupation: disable d/t stroke TEACHER    Employer: [employer]  Tobacco Use   Smoking status: Never   Smokeless tobacco: Never  Vaping Use   Vaping Use: Never used  Substance and Sexual Activity   Alcohol use: No   Drug use: No   Sexual  activity: Not Currently    Birth control/protection: Surgical  Other Topics Concern   Not on file  Social History Narrative   Lives with husband and son   Left Hand prior to stroke   Drinks 1-2 cups daily   Social Determinants of Health   Financial Resource Strain: Not on file  Food Insecurity: Not on file  Transportation Needs: Not on file  Physical Activity: Not on file  Stress: Not on file  Social Connections: Not on file  Intimate Partner Violence: Not on file    Medications:   Current Outpatient Medications on File Prior to Visit  Medication Sig Dispense Refill   atorvastatin (LIPITOR) 20 MG tablet Take 1 tablet (20 mg total) by mouth daily. 90 tablet 1   baclofen  (LIORESAL) 10 MG tablet TAKE 3 TABLETS (30 MG TOTAL) BY MOUTH 3 (THREE) TIMES DAILY. 270 tablet 0   buPROPion (WELLBUTRIN XL) 300 MG 24 hr tablet Take 1 tablet (300 mg total) by mouth daily. 30 tablet 0   carvedilol (COREG) 6.25 MG tablet Take 1 tablet (6.25 mg total) by mouth 2 (two) times daily with a meal. 180 tablet 1   clopidogrel (PLAVIX) 75 MG tablet Take 1 tablet (75 mg total) by mouth daily. 90 tablet 1   escitalopram (LEXAPRO) 20 MG tablet Take 1 tablet (20 mg total) by mouth daily. 90 tablet 1   furosemide (LASIX) 20 MG tablet Take 1 tablet (20 mg total) by mouth daily. 90 tablet 3   gabapentin (NEURONTIN) 300 MG capsule TAKE 1 CAPSULE BY MOUTH THREE TIMES A DAY 90 capsule 1   lisinopril (ZESTRIL) 2.5 MG tablet Take 1 tablet (2.5 mg total) by mouth daily. 90 tablet 1   metFORMIN (GLUCOPHAGE) 500 MG tablet Take 1 tablet (500 mg total) by mouth daily with breakfast. 30 tablet 0   traZODone (DESYREL) 100 MG tablet Take 1 tablet (100 mg total) by mouth at bedtime as needed for sleep. 90 tablet 1   zaleplon (SONATA) 10 MG capsule Take 1 capsule (10 mg total) by mouth at bedtime as needed for sleep. 30 capsule 1   Cyanocobalamin (VITAMIN B 12 PO) Take 1 tablet by mouth daily.     No current facility-administered medications on file prior to visit.    Allergies:   Allergies  Allergen Reactions   Bee Venom Anaphylaxis   Peanut-Containing Drug Products Anaphylaxis   Hydrocodone Hives    Generalize itching w/o rash   Isovue [Iopamidol] Hives and Itching    Pt broke out with one facial hive.  Had itching on her right upper ant and posterior arm w/o evidence of hives.  Pt given water and 25mg  benadryl po.  We observed pt for 30 minutes before d/c.  J. Bohm     Ambien [Zolpidem Tartrate] Other (See Comments)    forgetfulness   Aspirin Swelling    Reports blisters w/ ASA but pt reports is ok w/ Motrin, Advil, naproxen   Latex Hives    Physical Exam General: Mildly obese middle-aged  African-American lady, seated, in no evident distress Head: head normocephalic and atraumatic.   Neck: supple with no carotid or supraclavicular bruits Cardiovascular: regular rate and rhythm, no murmurs Musculoskeletal: no deformity Skin:  no rash/petichiae Vascular:  Normal pulses all extremities  Neurologic Exam Mental Status: Awake and fully alert. Oriented to place and time. Recent and remote memory intact. Attention span, concentration and fund of knowledge appropriate. Mood and affect appropriate.  Recall 3/3.  Able  to name 14 animals which can walk on 4 legs.  Clock drawing 4/4.  Mini-Mental status exam score 27/30 with deficits in attention and calculation and recall only.  Rating depression scale score 8 suggestive of mild depression. Cranial Nerves: Fundoscopic exam reveals sharp disc margins. Pupils equal, briskly reactive to light. Extraocular movements full without nystagmus. Visual fields full to confrontation. Hearing intact. Facial sensation intact. Face, tongue, palate moves normally and symmetrically.  Motor: Spastic left hemiplegia with left upper extremity 0/5 strength in left lower extremity 4/5 strength except left ankle foot drop.  Tone is increased in the left leg mild spasticity.  Normal strength on the right. Sensory.:  Diminished left hemibody touch , pinprick , position and vibratory sensation.  Coordination: Rapid alternating movements normal on the right and cannot be tested appropriately on the left.   Gait and Station: Arises from chair without difficulty. Stance is normal. Gait is spastic ataxic with circumduction and left foot drop and is wearing a left foot brace..  Reflexes: 1+ and symmetric. Toes downgoing.   NIHSS 6 Modified Rankin 3    ASSESSMENT: 50 year old African-American lady with spastic left hemiplegia secondary to right basal ganglia hypertensive hemorrhage and April 2017 but now complains of subjective memory and cognitive worsening likely due to  mild cognitive impairment and suboptimally treated underlying depression.     PLAN:I had a long discussion with the patient with regards to her memory loss and cognitive impairment and discussed plan for evaluation and treatment and answered questions. We also discussed memory compensation strategies and I advised her to increase participation in cognitively challenging activities like solving crossword puzzles, playing bridge and sodoku.  She was advised to follow-up with her primary care physician for more optimal treatment for her depression which could also be contributing.  I recommend further evaluation by checking memory panel labs, EEG and MRI scan of the brain with without contrast and MR angiogram of the brain and neck.  Continue Plavix for stroke prevention and maintain aggressive risk factor modification with strict control of hypertension with blood pressure goal below 130/90, lipids with LDL cholesterol goal below 70 mg percent and diabetes with hemoglobin A1c goal below 6.5%.  She will return for follow-up in the future in 2 months or call earlier if necessary. Greater than 50% time during this 45-minute visit was spent on counseling and coordination of care about her remote stroke, spastic monoplegia and memory loss and cognitive impairment and answering questions.  Antony Contras MD  Note: This document was prepared with digital dictation and possible smart phrase technology. Any transcriptional errors that result from this process are unintentional.

## 2021-06-11 NOTE — Patient Instructions (Signed)
I had a long discussion with the patient with regards to her memory loss and cognitive impairment and discussed plan for evaluation and treatment and answered questions. We also discussed memory compensation strategies and I advised her to increase participation in cognitively challenging activities like solving crossword puzzles, playing bridge and sodoku. I recommend further evaluation by checking memory panel labs, EEG and MRI scan of the brain with without contrast and MR angiogram of the brain and neck.  Continue Plavix for stroke prevention and maintain aggressive risk factor modification with strict control of hypertension with blood pressure goal below 130/90, lipids with LDL cholesterol goal below 70 mg percent and diabetes with hemoglobin A1c goal below 6.5%.  She will return for follow-up in the future in 2 months or call earlier if necessary. Memory Compensation Strategies  Use "WARM" strategy.  W= write it down  A= associate it  R= repeat it  M= make a mental note  2.   You can keep a Social worker.  Use a 3-ring notebook with sections for the following: calendar, important names and phone numbers,  medications, doctors' names/phone numbers, lists/reminders, and a section to journal what you did  each day.   3.    Use a calendar to write appointments down.  4.    Write yourself a schedule for the day.  This can be placed on the calendar or in a separate section of the Memory Notebook.  Keeping a  regular schedule can help memory.  5.    Use medication organizer with sections for each day or morning/evening pills.  You may need help loading it  6.    Keep a basket, or pegboard by the door.  Place items that you need to take out with you in the basket or on the pegboard.  You may also want to  include a message board for reminders.  7.    Use sticky notes.  Place sticky notes with reminders in a place where the task is performed.  For example: " turn off the  stove" placed by the  stove, "lock the door" placed on the door at eye level, " take your medications" on  the bathroom mirror or by the place where you normally take your medications.  8.    Use alarms/timers.  Use while cooking to remind yourself to check on food or as a reminder to take your medicine, or as a  reminder to make a call, or as a reminder to perform another task, etc.

## 2021-06-12 LAB — DEMENTIA PANEL
Homocysteine: 11.1 umol/L (ref 0.0–14.5)
RPR Ser Ql: NONREACTIVE
TSH: 0.737 u[IU]/mL (ref 0.450–4.500)
Vitamin B-12: 364 pg/mL (ref 232–1245)

## 2021-06-13 NOTE — Progress Notes (Signed)
Kindly inform the patient that lab work for reversible causes of memory loss was all normal

## 2021-06-13 NOTE — Telephone Encounter (Signed)
I called the patient. She saw that her dementia panel was normal on mychart. I explained that just means there is no identifiable cause in those specific areas that need to be addressed. She verbalized understanding.

## 2021-06-17 ENCOUNTER — Encounter: Payer: Self-pay | Admitting: *Deleted

## 2021-06-17 ENCOUNTER — Telehealth: Payer: Self-pay | Admitting: *Deleted

## 2021-06-17 NOTE — Telephone Encounter (Signed)
Mychart message sent to the patient .

## 2021-06-17 NOTE — Telephone Encounter (Signed)
-----   Message from Garvin Fila, MD sent at 06/13/2021  3:47 PM EDT ----- Mitchell Heir inform the patient that lab work for reversible causes of memory loss was all normal.

## 2021-06-25 ENCOUNTER — Ambulatory Visit (INDEPENDENT_AMBULATORY_CARE_PROVIDER_SITE_OTHER): Payer: No Typology Code available for payment source | Admitting: Adult Health

## 2021-06-25 ENCOUNTER — Other Ambulatory Visit (INDEPENDENT_AMBULATORY_CARE_PROVIDER_SITE_OTHER): Payer: Self-pay | Admitting: Adult Health

## 2021-06-25 ENCOUNTER — Encounter (INDEPENDENT_AMBULATORY_CARE_PROVIDER_SITE_OTHER): Payer: Self-pay | Admitting: Adult Health

## 2021-06-25 ENCOUNTER — Other Ambulatory Visit: Payer: Self-pay

## 2021-06-25 VITALS — BP 132/81 | HR 70 | Temp 98.6°F | Ht 59.0 in | Wt 154.0 lb

## 2021-06-25 DIAGNOSIS — E669 Obesity, unspecified: Secondary | ICD-10-CM

## 2021-06-25 DIAGNOSIS — Z6833 Body mass index (BMI) 33.0-33.9, adult: Secondary | ICD-10-CM

## 2021-06-25 DIAGNOSIS — R7303 Prediabetes: Secondary | ICD-10-CM

## 2021-06-25 DIAGNOSIS — Z9189 Other specified personal risk factors, not elsewhere classified: Secondary | ICD-10-CM | POA: Diagnosis not present

## 2021-06-25 DIAGNOSIS — F3289 Other specified depressive episodes: Secondary | ICD-10-CM

## 2021-06-25 DIAGNOSIS — E559 Vitamin D deficiency, unspecified: Secondary | ICD-10-CM

## 2021-06-25 DIAGNOSIS — Z8673 Personal history of transient ischemic attack (TIA), and cerebral infarction without residual deficits: Secondary | ICD-10-CM | POA: Diagnosis not present

## 2021-06-25 MED ORDER — BUPROPION HCL ER (XL) 300 MG PO TB24
300.0000 mg | ORAL_TABLET | Freq: Every day | ORAL | 0 refills | Status: DC
Start: 2021-06-25 — End: 2021-07-16

## 2021-06-25 MED ORDER — METFORMIN HCL 500 MG PO TABS: 500.0000 mg | ORAL_TABLET | Freq: Every day | ORAL | 0 refills | Status: DC

## 2021-06-26 LAB — LIPID PANEL
Chol/HDL Ratio: 2.5 ratio (ref 0.0–4.4)
Cholesterol, Total: 127 mg/dL (ref 100–199)
HDL: 51 mg/dL
LDL Chol Calc (NIH): 64 mg/dL (ref 0–99)
Triglycerides: 53 mg/dL (ref 0–149)
VLDL Cholesterol Cal: 12 mg/dL (ref 5–40)

## 2021-06-26 LAB — INSULIN, RANDOM: INSULIN: 11.5 u[IU]/mL (ref 2.6–24.9)

## 2021-06-26 LAB — VITAMIN D 25 HYDROXY (VIT D DEFICIENCY, FRACTURES): Vit D, 25-Hydroxy: 33.3 ng/mL (ref 30.0–100.0)

## 2021-06-30 ENCOUNTER — Other Ambulatory Visit: Payer: Self-pay

## 2021-06-30 ENCOUNTER — Ambulatory Visit
Admission: RE | Admit: 2021-06-30 | Discharge: 2021-06-30 | Disposition: A | Discharge status: Home / Self Care | Payer: No Typology Code available for payment source | Source: Ambulatory Visit | Attending: Neurology | Admitting: Neurology

## 2021-06-30 DIAGNOSIS — Z8673 Personal history of transient ischemic attack (TIA), and cerebral infarction without residual deficits: Secondary | ICD-10-CM | POA: Diagnosis not present

## 2021-06-30 DIAGNOSIS — R413 Other amnesia: Secondary | ICD-10-CM | POA: Diagnosis not present

## 2021-06-30 MED ORDER — GADOBENATE DIMEGLUMINE 529 MG/ML IV SOLN
14.0000 mL | Freq: Once | INTRAVENOUS | Status: AC | PRN
  Administered 2021-06-30: 14 mL via INTRAVENOUS

## 2021-06-30 NOTE — Progress Notes (Signed)
Chief Complaint:   OBESITY Abigail Campos is here to discuss her progress with her obesity treatment plan along with follow-up of her obesity related diagnoses. Abigail Campos is on the [employer] and states she is following her eating plan approximately 30% of the time. Abigail Campos states she is walking 30 minutes 7 times per week.  Today's visit was #: 10 Starting weight: 164 lbs Starting date: 12/26/2020 Today's weight: 154 lbs Today's date: 06/25/2021 Total lbs lost to date: 10 Total lbs lost since last in-office visit: 0  Interim  History: Abigail Campos recently traveled to [place] 5-11 July 22.  She had fish at almost every meal, although the fish was heavily saturated with sauces.  Subjective:   1. Pre-diabetes 05/20/2021 A1c 5.8 and BG 143- obtained by PCP. Pt needs an insulin level. She is on Metformin 500 mg QD and tolerating it well.  Lab Results  Component Value Date   HGBA1C 5.8 05/20/2021   Lab Results  Component Value Date   INSULIN 11.5 06/25/2021   2. Other depression, with emotional eating She reports stable mood. Pt denies suicidal or homicidal ideations.  She is on bupropion XL 300 mg QD and reports being able to control snacking/cravings better with recent increase in dose. BP/HR stable at OV.  3. Vitamin D deficiency Abigail Campos is on OTC Vit D3 supplement and is unsure of dose.  4. History of CVA (cerebrovascular accident) PCP manages Lipitor 20 mg QD.  She has a history of CVA 2017 with residual right sided deficits.  5. At risk for activity intolerance Abigail Campos is at risk for exercise intolerance due to right sided deficits post CVA.  Assessment/Plan:   1. Pre-diabetes Abigail Campos will continue to work on weight loss, exercise, and decreasing simple carbohydrates to help decrease the risk of diabetes.  Check labs today.  Refill- metFORMIN (GLUCOPHAGE) 500 MG tablet; Take 1 tablet (500 mg total) by mouth daily with breakfast.  Dispense: 30 tablet; Refill: 0  - Insulin,  random  2. Other depression, with emotional eating Behavior modification techniques were discussed today to help Abigail Campos deal with her emotional/non-hunger eating behaviors.  Orders and follow up as documented in patient record.   Refill- buPROPion (WELLBUTRIN XL) 300 MG 24 hr tablet; Take 1 tablet (300 mg total) by mouth daily.  Dispense: 30 tablet; Refill: 0  3. Vitamin D deficiency Low Vitamin D level contributes to fatigue and are associated with obesity, breast, and colon cancer. She agrees to continue to take OTC Vitamin D QD and will follow-up for routine testing of Vitamin D, at least 2-3 times per year to avoid over-replacement. Check labs today.  - VITAMIN D 25 Hydroxy (Vit-D Deficiency, Fractures)  4. History of CVA (cerebrovascular accident) Check labs today.  - Lipid panel  5. At risk for activity intolerance Abigail Campos was given approximately 15 minutes of exercise intolerance counseling today. She is 50 y.o. female and has risk factors exercise intolerance including obesity. We discussed intensive lifestyle modifications today with an emphasis on specific weight loss instructions and strategies. Abigail Campos will slowly increase activity as tolerated.  Repetitive spaced learning was employed today to elicit superior memory formation and behavioral change.   6. Obesity with current BMI of 31.2  Abigail Campos is currently in the action stage of change. As such, her goal is to continue with weight loss efforts. She has agreed to the [employer].   Exercise goals:  As is  Behavioral modification strategies: increasing lean protein intake, decreasing simple  carbohydrates, meal planning and cooking strategies, keeping healthy foods in the home, and planning for success.  [name] has agreed to follow-up with our clinic in 4 weeks. She was informed of the importance of frequent follow-up visits to maximize her success with intensive lifestyle modifications for her multiple health conditions.    Abigail Campos was informed we would discuss her lab results at her next visit unless there is a critical issue that needs to be addressed sooner. Abigail Campos agreed to keep her next visit at the agreed upon time to discuss these results.  Objective:   Blood pressure 132/81, pulse 70, temperature 98.6 F (37 C), height 4\' 11"  (1.499 m), weight 154 lb (69.9 kg), SpO2 91 %. Body mass index is 31.1 kg/m.  General: Cooperative, alert, well developed, in no acute distress. HEENT: Conjunctivae and lids unremarkable. Cardiovascular: Regular rhythm.  Lungs: Normal work of breathing. Neurologic: No focal deficits.   Lab Results  Component Value Date   CREATININE 0.94 05/20/2021   BUN 11 05/20/2021   NA 138 05/20/2021   K 3.8 05/20/2021   CL 101 05/20/2021   CO2 26 05/20/2021   Lab Results  Component Value Date   ALT 34 (H) 12/26/2020   AST 27 12/26/2020   ALKPHOS 103 12/26/2020   BILITOT 0.4 12/26/2020   Lab Results  Component Value Date   HGBA1C 5.8 05/20/2021   HGBA1C 6.0 11/19/2020   HGBA1C 6.1 (H) 08/31/2020   HGBA1C 5.7 02/14/2020   HGBA1C 6.0 07/12/2019   Lab Results  Component Value Date   INSULIN 11.5 06/25/2021   Lab Results  Component Value Date   TSH 0.737 06/11/2021   Lab Results  Component Value Date   CHOL 127 06/25/2021   HDL 51 06/25/2021   LDLCALC 64 06/25/2021   TRIG 53 06/25/2021   CHOLHDL 2.5 06/25/2021   Lab Results  Component Value Date   VD25OH 33.3 06/25/2021   VD25OH 17.70 (L) 11/19/2020   Lab Results  Component Value Date   WBC 3.8 (L) 01/12/2021   HGB 13.2 01/12/2021   HCT 39.8 01/12/2021   MCV 92.3 01/12/2021   PLT 128 (L) 01/12/2021   Lab Results  Component Value Date   IRON 97 12/26/2020   TIBC 313 12/26/2020   FERRITIN 72 12/26/2020    Attestation Statements:   Reviewed by clinician on day of visit: allergies, medications, problem list, medical history, surgical history, family history, social history, and previous encounter  notes.  Coral Ceo, CMA, am acting as transcriptionist for Mina Marble, NP  I have reviewed the above documentation for accuracy and completeness, and I agree with the above. -  Elsia Lasota d. Song Myre, NP-C

## 2021-07-01 ENCOUNTER — Encounter
Payer: No Typology Code available for payment source | Attending: Physical Medicine & Rehabilitation | Admitting: Physical Medicine & Rehabilitation

## 2021-07-01 ENCOUNTER — Encounter: Payer: Self-pay | Admitting: Physical Medicine & Rehabilitation

## 2021-07-01 VITALS — BP 117/83 | HR 70 | Temp 98.3°F | Ht 59.0 in | Wt 154.0 lb

## 2021-07-01 DIAGNOSIS — I69154 Hemiplegia and hemiparesis following nontraumatic intracerebral hemorrhage affecting left non-dominant side: Secondary | ICD-10-CM | POA: Diagnosis not present

## 2021-07-01 NOTE — Progress Notes (Signed)
Subjective:    Patient ID: Abigail Campos, female    DOB: 05/04/1971, 50 y.o.   MRN: [id number] right-handed female with history of hypertension, migraine headaches presents for transitional care management after being discharged from the hospital after right basal ganglia hemorrhage. DATE OF ADMISSION:  04/07/2016 DATE OF DISCHARGE:  05/08/2016 HPI 50 year old female with history of chronic left spastic hemiplegia secondary to CVA in 2017, had Dysport injection performed by Dr. Posey Pronto approximately 2 months ago.  The patient states that her arm foot and ankle are doing okay but the finger flexors are still tight.  She also notes some thumb flexor tightness. She had no adverse effects of the injection. She does not do much stretching these days and no longer wears her resting hand splint.  She did have a custom splint made for her at one point.  She also has a off-the-shelf model as well. No recent falls Foot and ankle are doing okay in the AFO.  She is able to don and doff her clothes with exception of unable to tie shoes  Patient remains on Plavix for CVA prophylaxis. Pain Inventory Average Pain 5 Pain Right Now 0 My pain is aching  In the last 24 hours, has pain interfered with the following? General activity 4 Relation with others 4 Enjoyment of life 4 What TIME of day is your pain at its worst? morning  and varies Sleep (in general) Good  Pain is worse with: walking, standing, and some activites Pain improves with: rest, heat/ice, pacing activities, and medication Relief from Meds:  not sure  Family History  Problem Relation Age of Onset   Diabetes Mother    Hypertension Mother    Glaucoma Mother    Colon cancer Mother        mother age 4   Cancer Mother    Anxiety disorder Mother    Obesity Mother    Rectal cancer Father        dx 54   Hypertension Father    Cancer Father    Sleep apnea Father    Alcoholism Father    Obesity Father    Stroke Paternal  Grandfather    Stroke Maternal Grandfather    Diabetes Brother    Hypertension Brother    Heart disease Maternal Aunt        x 2 did 2012 CAD-CHF   Diabetes Brother    Hypertension Brother    Lung cancer Maternal Aunt    Breast cancer Neg Hx    Esophageal cancer Neg Hx    Stomach cancer Neg Hx    Social History   Socioeconomic History   Marital status: Married    Spouse name: [name]   Number of children: 2   Years of education: Not on file   Highest education level: Not on file  Occupational History   Occupation: disable d/t stroke TEACHER    Employer: [employer]  Tobacco Use   Smoking status: Never   Smokeless tobacco: Never  Vaping Use   Vaping Use: Never used  Substance and Sexual Activity   Alcohol use: No   Drug use: No   Sexual activity: Not Currently    Birth control/protection: Surgical  Other Topics Concern   Not on file  Social History Narrative   Lives with husband and son   Left Hand prior to stroke   Drinks 1-2 cups daily   Social Determinants of Health   Financial Resource Strain: Not on file  Food Insecurity: Not on file  Transportation Needs: Not on file  Physical Activity: Not on file  Stress: Not on file  Social Connections: Not on file   Past Surgical History:  Procedure Laterality Date   ABDOMINAL HYSTERECTOMY  11-14-07   no oophorectomy per surgical report   CESAREAN SECTION     [id number]   INGUINAL HERNIA REPAIR     2004   Past Surgical History:  Procedure Laterality Date   ABDOMINAL HYSTERECTOMY  11-14-07   no oophorectomy per surgical report   CESAREAN SECTION     [id number]   INGUINAL HERNIA REPAIR     2004   Past Medical History:  Diagnosis Date   Anemia    Anxiety and depression    Back pain    Essential hypertension 10/19/2007   03-2010: metoprolol changed to bystolic (was not feeling well on it, no specific allergy or reaction)     Food allergy    Hemiplegia (HCC)    Hyperlipemia    Hypertension    Joint pain     Migraine    Neuromuscular disorder ([place])    left sided hemiplegia - has brace for l leg and walks with a cane   Obesity    Ovarian cancer ([place])    Prediabetes    Sleep apnea    not wearing c-pap yet   Sleep apnea    Stroke ([place]) 2017   Vitamin D deficiency    BP 117/83 (BP Location: Right Arm)   Pulse 70   Temp 98.3 F (36.8 C) (Oral)   Ht 4\' 11"  (1.499 m)   Wt 154 lb (69.9 kg)   SpO2 96%   BMI 31.10 kg/m   Opioid Risk Score:   Fall Risk Score:  `1  Depression screen PHQ 2/9  Depression screen [facility] 2/9 07/01/2021 05/20/2021 12/26/2020 11/19/2020 10/03/2020 09/19/2020 07/01/2020  Decreased Interest 1 2 0 1 0 1 1  Down, Depressed, Hopeless 1 2 2 1  0 1 1  PHQ - 2 Score 2 4 2 2  0 2 2  Altered sleeping - 3 3 3  - - -  Tired, decreased energy - 1 3 2  - - -  Change in appetite - 0 2 2 - - -  Feeling bad or failure about yourself  - 1 2 2  - - -  Trouble concentrating - 3 1 0 - - -  Moving slowly or fidgety/restless - 1 2 1  - - -  Suicidal thoughts - 0 1 1 - - -  PHQ-9 Score - 13 16 13  - - -  Difficult doing work/chores - Somewhat difficult Somewhat difficult Somewhat difficult - - -  Some recent data might be hidden     Review of Systems  Constitutional: Negative.   HENT: Negative.    Eyes: Negative.   Respiratory: Negative.    Cardiovascular: Negative.   Gastrointestinal: Negative.   Endocrine: Negative.   Genitourinary: Negative.   Musculoskeletal:  Positive for gait problem.       Left foot pain , left knee pain , left hip pain , left arm pain   Skin: Negative.   Allergic/Immunologic: Negative.   Hematological: Negative.   Psychiatric/Behavioral: Negative.        Objective:   Physical Exam Vitals reviewed.  Constitutional:      Appearance: She is obese.  HENT:     Head: Normocephalic and atraumatic.  Eyes:     Extraocular Movements: Extraocular movements intact.     Conjunctiva/sclera:  Conjunctivae normal.     Pupils: Pupils are equal, round, and reactive to  light.  Skin:    General: Skin is warm and dry.  Neurological:     Mental Status: She is alert and oriented to person, place, and time.     Cranial Nerves: No dysarthria.     Motor: Abnormal muscle tone present.     Gait: Gait abnormal.     Comments: Motor strength is 0/5 in left deltoid bicep tricep finger flexors and extensors Left lower extremity 3 - hip flexion 4/5 knee extension 0 at the ankle dorsiflexor and plantar flexor 5/5 strength on the right side Sensation is reported is equal bilateral upper and lower limbs Tone Left elbow flexors MAS 1 Left finger flexors MAS 3 Left wrist flexors MAS 3 Left thumb flexor MAS 3 Left ankle plantar flexor MAS 2  Psychiatric:        Attention and Perception: Attention normal.        Mood and Affect: Mood normal.        Speech: Speech is delayed.        Behavior: Behavior is slowed.        Thought Content: Thought content normal.          Assessment & Plan:  1.  Left spastic hemiparesis secondary to CVA, despite high-dose botulinum toxin injection into the finger flexors she still has some finger flexor spasticity.  This appears to be related to wrist flexor spasticity and likely soft tissue contracture as well. Recommend repeating injection however focus more on wrist flexors and follow-up with ambulatory referral to OT. MUSCLE UNITS    Left FCR: 200 units Left FCU: 100 units Left FDS: 300 units Left FDP: 300units  Left FPL  200units Left Med gastroc: 150 units   Left Lat gastroc: 150 units  Left FDS 100 Total units injected: 1500

## 2021-07-06 DIAGNOSIS — Z8673 Personal history of transient ischemic attack (TIA), and cerebral infarction without residual deficits: Secondary | ICD-10-CM | POA: Insufficient documentation

## 2021-07-06 DIAGNOSIS — E559 Vitamin D deficiency, unspecified: Secondary | ICD-10-CM | POA: Insufficient documentation

## 2021-07-09 ENCOUNTER — Other Ambulatory Visit: Payer: Self-pay | Admitting: Internal Medicine

## 2021-07-09 ENCOUNTER — Other Ambulatory Visit (INDEPENDENT_AMBULATORY_CARE_PROVIDER_SITE_OTHER): Payer: Self-pay | Admitting: Family Medicine

## 2021-07-09 ENCOUNTER — Ambulatory Visit (INDEPENDENT_AMBULATORY_CARE_PROVIDER_SITE_OTHER): Payer: No Typology Code available for payment source

## 2021-07-09 ENCOUNTER — Telehealth: Payer: Self-pay

## 2021-07-09 DIAGNOSIS — R410 Disorientation, unspecified: Secondary | ICD-10-CM

## 2021-07-09 DIAGNOSIS — R413 Other amnesia: Secondary | ICD-10-CM

## 2021-07-09 DIAGNOSIS — F3289 Other specified depressive episodes: Secondary | ICD-10-CM

## 2021-07-09 NOTE — Telephone Encounter (Addendum)
-----   Message from Garvin Fila, MD sent at 07/09/2021  9:55 AM EDT ----- Mitchell Heir inform the patient that MR angiogram study of the brain and neck blood vessels shows no major blockages or aneurysms to worry about. ----- Message ----- From: Britt Bottom, MD Sent: 06/30/2021   6:14 PM EDT To: Garvin Fila, MD  Garvin Fila, MD  P Gna-Pod 3 Results Kindly inform the patient that MRI scan of the brain shows evidence of the old stroke on the right side towards the back of the brain and some changes of hardening of arteries.  No new or worrisome finding.  Expected changes comparedto previous MRI nothing to worry about.

## 2021-07-09 NOTE — Telephone Encounter (Signed)
I called the pt and advised of results. Pt verbalized understanding and had no questions/concerns.

## 2021-07-14 ENCOUNTER — Telehealth: Payer: Self-pay

## 2021-07-14 NOTE — Telephone Encounter (Signed)
I called the pt and advised via vm of EEG results (ok per DPR). Pt was advised to CB if she had any questions.

## 2021-07-14 NOTE — Telephone Encounter (Signed)
-----   Message from Garvin Fila, MD sent at 07/11/2021 11:57 AM EDT ----- Mitchell Heir inform the patient that brainwave study was normal.  No evidence of seizure activity was noted. ----- Message ----- From: Garvin Fila, MD Sent: 07/10/2021   5:45 PM EDT To: Garvin Fila, MD

## 2021-07-16 ENCOUNTER — Other Ambulatory Visit (INDEPENDENT_AMBULATORY_CARE_PROVIDER_SITE_OTHER): Payer: Self-pay | Admitting: Adult Health

## 2021-07-16 ENCOUNTER — Encounter (INDEPENDENT_AMBULATORY_CARE_PROVIDER_SITE_OTHER): Payer: Self-pay | Admitting: Family Medicine

## 2021-07-16 ENCOUNTER — Ambulatory Visit (INDEPENDENT_AMBULATORY_CARE_PROVIDER_SITE_OTHER): Payer: No Typology Code available for payment source | Admitting: Family Medicine

## 2021-07-16 MED ORDER — BUPROPION HCL ER (XL) 150 MG PO TB24: ORAL_TABLET | ORAL | 0 refills | Status: DC

## 2021-07-16 NOTE — Telephone Encounter (Signed)
Last OV with Katy 

## 2021-07-16 NOTE — Telephone Encounter (Signed)
Please advise 

## 2021-07-23 ENCOUNTER — Encounter (INDEPENDENT_AMBULATORY_CARE_PROVIDER_SITE_OTHER): Payer: Self-pay | Admitting: Adult Health

## 2021-07-23 ENCOUNTER — Other Ambulatory Visit: Payer: Self-pay

## 2021-07-23 ENCOUNTER — Telehealth (INDEPENDENT_AMBULATORY_CARE_PROVIDER_SITE_OTHER): Payer: No Typology Code available for payment source | Admitting: Adult Health

## 2021-07-23 DIAGNOSIS — F3289 Other specified depressive episodes: Secondary | ICD-10-CM | POA: Diagnosis not present

## 2021-07-23 DIAGNOSIS — E669 Obesity, unspecified: Secondary | ICD-10-CM

## 2021-07-23 DIAGNOSIS — Z6833 Body mass index (BMI) 33.0-33.9, adult: Secondary | ICD-10-CM

## 2021-07-23 DIAGNOSIS — R110 Nausea: Secondary | ICD-10-CM | POA: Insufficient documentation

## 2021-07-23 NOTE — Progress Notes (Signed)
TeleHealth Visit:  Due to the COVID-19 pandemic, this visit was completed with telemedicine (audio/video) technology to reduce patient and provider exposure as well as to preserve personal protective equipment.   Vidushi has verbally consented to this TeleHealth visit. The patient is located at home, the provider is located at the [facility] office. The participants in this visit include the listed provider and patient. The visit was conducted today via MyChart video.  Chief Complaint: OBESITY Estreya is here to discuss her progress with her obesity treatment plan along with follow-up of her obesity related diagnoses. Tannisha is on the [employer] and states she is following her eating plan approximately 25-30% of the time. Lashondra states she is walking for 25 minutes 4-5 times per week.  Today's visit was #: 11 Starting weight: 164 lbs Starting date: 12/26/2020  Interim History: Kimarie is experiencing diarrhea, nausea without vomiting, and fatigue.   She denies ever/cough/HA/nasal drainage/dyspnea.  She denies known exposure to COVID-19 - received 3 doses of the COVID-19 vaccine.  Last dose February 2022.   She would like to change plan-tiring of Pescatarian plan.  She would like to incorporate more protein options into her daily eating.  Subjective:   1. Nausea without vomiting Ocie is experiencing diarrhea, nausea without vomiting, and fatigue.   She denies ever/cough/HA/nasal drainage/dyspnea.  She denies known exposure to COVID-19 - received 3 doses of the COVID-19 vaccine.  Last dose February 2022  2. Other depression, with emotional eating Unable to refill bupropion XL 300 mg daily due to cost.  PCP sent in bupropion 100 mg twice daily.  She has been taking - denies history of seizure.  Continues to experience profound emotional eating.  Assessment/Plan:   1. Nausea without vomiting Home COVID-19 test.  Criss Rosales diet.  2. Other depression, with emotional  eating Patient was referred to Dr. Mallie Mussel, our Bariatric Psychologist, for evaluation due to her elevated PHQ-9 score and significant struggles with emotional eating.   3. Obesity with current BMI of 31.2  Nazli is currently in the action stage of change. As such, her goal is to continue with weight loss efforts. She has agreed to the Category 1 Plan.   Exercise goals:  As is.  Behavioral modification strategies: increasing lean protein intake, decreasing simple carbohydrates, meal planning and cooking strategies, keeping healthy foods in the home, and planning for success.  Convert back to Category 1 meal plan - information sent via MyChart. Bland diet, use home COVID-19 antigen test. Referral to Dr. Mallie Mussel.  Tria has agreed to follow-up with our clinic as scheduled. She was informed of the importance of frequent follow-up visits to maximize her success with intensive lifestyle modifications for her multiple health conditions.  Objective:   VITALS: Per patient if applicable, see vitals. GENERAL: Alert and in no acute distress. CARDIOPULMONARY: No increased WOB. Speaking in clear sentences.  PSYCH: Pleasant and cooperative. Speech normal rate and rhythm. Affect is appropriate. Insight and judgement are appropriate. Attention is focused, linear, and appropriate.  NEURO: Oriented as arrived to appointment on time with no prompting.   Lab Results  Component Value Date   CREATININE 0.94 05/20/2021   BUN 11 05/20/2021   NA 138 05/20/2021   K 3.8 05/20/2021   CL 101 05/20/2021   CO2 26 05/20/2021   Lab Results  Component Value Date   ALT 34 (H) 12/26/2020   AST 27 12/26/2020   ALKPHOS 103 12/26/2020   BILITOT 0.4 12/26/2020  Lab Results  Component Value Date   HGBA1C 5.8 05/20/2021   HGBA1C 6.0 11/19/2020   HGBA1C 6.1 (H) 08/31/2020   HGBA1C 5.7 02/14/2020   HGBA1C 6.0 07/12/2019   Lab Results  Component Value Date   INSULIN 11.5 06/25/2021   Lab Results  Component  Value Date   TSH 0.737 06/11/2021   Lab Results  Component Value Date   CHOL 127 06/25/2021   HDL 51 06/25/2021   LDLCALC 64 06/25/2021   TRIG 53 06/25/2021   CHOLHDL 2.5 06/25/2021   Lab Results  Component Value Date   VD25OH 33.3 06/25/2021   VD25OH 17.70 (L) 11/19/2020   Lab Results  Component Value Date   WBC 3.8 (L) 01/12/2021   HGB 13.2 01/12/2021   HCT 39.8 01/12/2021   MCV 92.3 01/12/2021   PLT 128 (L) 01/12/2021   Lab Results  Component Value Date   IRON 97 12/26/2020   TIBC 313 12/26/2020   FERRITIN 72 12/26/2020   Attestation Statements:   Reviewed by clinician on day of visit: allergies, medications, problem list, medical history, surgical history, family history, social history, and previous encounter notes.  Time spent on visit including pre-visit chart review and post-visit charting and care was 28 minutes.  I, Water quality scientist, CMA, am acting as Location manager for Mina Marble, NP.  I have reviewed the above documentation for accuracy and completeness, and I agree with the above. - Kyrin Garn d. Makylah Bossard, NP-C

## 2021-07-23 NOTE — Telephone Encounter (Signed)
FYI

## 2021-07-29 ENCOUNTER — Other Ambulatory Visit: Payer: Self-pay | Admitting: Internal Medicine

## 2021-08-12 ENCOUNTER — Other Ambulatory Visit: Payer: Self-pay | Admitting: Internal Medicine

## 2021-08-13 ENCOUNTER — Other Ambulatory Visit (INDEPENDENT_AMBULATORY_CARE_PROVIDER_SITE_OTHER): Payer: Self-pay | Admitting: Family Medicine

## 2021-08-13 ENCOUNTER — Encounter (INDEPENDENT_AMBULATORY_CARE_PROVIDER_SITE_OTHER): Payer: Self-pay

## 2021-08-13 ENCOUNTER — Ambulatory Visit (INDEPENDENT_AMBULATORY_CARE_PROVIDER_SITE_OTHER): Payer: No Typology Code available for payment source | Admitting: Family Medicine

## 2021-08-13 ENCOUNTER — Encounter (INDEPENDENT_AMBULATORY_CARE_PROVIDER_SITE_OTHER): Payer: Self-pay | Admitting: Family Medicine

## 2021-08-13 ENCOUNTER — Other Ambulatory Visit: Payer: Self-pay

## 2021-08-13 VITALS — BP 116/78 | HR 65 | Temp 98.4°F | Ht 59.0 in | Wt 158.0 lb

## 2021-08-13 DIAGNOSIS — F4323 Adjustment disorder with mixed anxiety and depressed mood: Secondary | ICD-10-CM | POA: Diagnosis not present

## 2021-08-13 DIAGNOSIS — E669 Obesity, unspecified: Secondary | ICD-10-CM | POA: Diagnosis not present

## 2021-08-13 DIAGNOSIS — Z6833 Body mass index (BMI) 33.0-33.9, adult: Secondary | ICD-10-CM | POA: Diagnosis not present

## 2021-08-13 DIAGNOSIS — R7301 Impaired fasting glucose: Secondary | ICD-10-CM | POA: Diagnosis not present

## 2021-08-13 DIAGNOSIS — Z8673 Personal history of transient ischemic attack (TIA), and cerebral infarction without residual deficits: Secondary | ICD-10-CM | POA: Diagnosis not present

## 2021-08-13 MED ORDER — TIRZEPATIDE 2.5 MG/0.5ML ~~LOC~~ SOAJ: 2.5000 mg | SUBCUTANEOUS | 0 refills | Status: DC

## 2021-08-13 NOTE — Progress Notes (Signed)
Chief Complaint:   OBESITY Abigail Campos is here to discuss her progress with her obesity treatment plan along with follow-up of her obesity related diagnoses. See Medical Weight Management Flowsheet for complete bioelectrical impedance results.  Today's visit was #: 12 Starting weight: 164 lbs Starting date: 12/26/2020 Weight change since last visit: +4 lbs Total lbs lost to date: 6 lbs Total weight loss percentage to date: -3.66%  Nutrition Plan:  Category 1 Plan for 0% of the time. Activity: None.  Interim History: Abigail Campos is tearful today. She has been frustrated with weight gain. She endorses polyphagia.  Assessment/Plan:   1. Impaired fasting glucose, with polyphagia Not at goal. Current treatment: None. She will continue to focus on protein-rich, low simple carbohydrate foods. We reviewed the importance of hydration, regular exercise for stress reduction, and restorative sleep.  Plan:  Start Mounjaro 2.5 mg subcutaneously weekly, as per below.  - Start tirzepatide [facility]) 2.5 MG/0.5ML Pen; Inject 2.5 mg into the skin once a week.  Dispense: 2 mL; Refill: 0  2. History of CVA (cerebrovascular accident) PCP manages Lipitor 20 mg daily. She has a history of CVA in 2017 with residual right-sided deficits.  3. Adjustment disorder with mixed anxiety and depressed mood Not at goal. Medication: Wellbutrin 300 mg, Lexapro 20 mg daily.  Plan:  Will place referral to Psychology - will need reduced price/free.    4. Obesity with current BMI of 31.9  Course: Abigail Campos is currently in the action stage of change. As such, her goal is to continue with weight loss efforts.   Nutrition goals: She has agreed to the Category 1 Plan.   Exercise goals: All adults should avoid inactivity. Some physical activity is better than none, and adults who participate in any amount of physical activity gain some health benefits.  Behavioral modification strategies: increasing lean protein intake,  decreasing simple carbohydrates, increasing vegetables, increasing water intake, and emotional eating strategies.  Abigail Campos has agreed to follow-up with our clinic in 4 weeks. She was informed of the importance of frequent follow-up visits to maximize her success with intensive lifestyle modifications for her multiple health conditions.   Objective:   Blood pressure 116/78, pulse 65, temperature 98.4 F (36.9 C), temperature source Oral, height '4\' 11"'$  (1.499 m), weight 158 lb (71.7 kg), SpO2 94 %. Body mass index is 31.91 kg/m.  General: Cooperative, alert, well developed, in no acute distress. HEENT: Conjunctivae and lids unremarkable. Cardiovascular: Regular rhythm.  Lungs: Normal work of breathing. Neurologic: No focal deficits.   Lab Results  Component Value Date   CREATININE 0.94 05/20/2021   BUN 11 05/20/2021   NA 138 05/20/2021   K 3.8 05/20/2021   CL 101 05/20/2021   CO2 26 05/20/2021   Lab Results  Component Value Date   ALT 34 (H) 12/26/2020   AST 27 12/26/2020   ALKPHOS 103 12/26/2020   BILITOT 0.4 12/26/2020   Lab Results  Component Value Date   HGBA1C 5.8 05/20/2021   HGBA1C 6.0 11/19/2020   HGBA1C 6.1 (H) 08/31/2020   HGBA1C 5.7 02/14/2020   HGBA1C 6.0 07/12/2019   Lab Results  Component Value Date   INSULIN 11.5 06/25/2021   Lab Results  Component Value Date   TSH 0.737 06/11/2021   Lab Results  Component Value Date   CHOL 127 06/25/2021   HDL 51 06/25/2021   LDLCALC 64 06/25/2021   TRIG 53 06/25/2021   CHOLHDL 2.5 06/25/2021   Lab Results  Component Value  Date   VD25OH 33.3 06/25/2021   VD25OH 17.70 (L) 11/19/2020   Lab Results  Component Value Date   WBC 3.8 (L) 01/12/2021   HGB 13.2 01/12/2021   HCT 39.8 01/12/2021   MCV 92.3 01/12/2021   PLT 128 (L) 01/12/2021   Lab Results  Component Value Date   IRON 97 12/26/2020   TIBC 313 12/26/2020   FERRITIN 72 12/26/2020   Attestation Statements:   Reviewed by clinician on day of  visit: allergies, medications, problem list, medical history, surgical history, family history, social history, and previous encounter notes.  I, Water quality scientist, CMA, am acting as transcriptionist for Briscoe Deutscher, DO  I have reviewed the above documentation for accuracy and completeness, and I agree with the above. Briscoe Deutscher, DO

## 2021-08-14 NOTE — Telephone Encounter (Signed)
Prior authorization has been started for Mounjaro. Will notify patient and provider once response is received.  

## 2021-08-14 NOTE — Telephone Encounter (Signed)
Last OV with Dr Wallace 

## 2021-08-19 ENCOUNTER — Encounter (INDEPENDENT_AMBULATORY_CARE_PROVIDER_SITE_OTHER): Payer: Self-pay

## 2021-08-19 NOTE — Telephone Encounter (Signed)
Pt last seen by Dr. Wallace.  

## 2021-08-29 ENCOUNTER — Encounter
Payer: No Typology Code available for payment source | Attending: Physical Medicine & Rehabilitation | Admitting: Physical Medicine & Rehabilitation

## 2021-08-29 ENCOUNTER — Encounter: Payer: Self-pay | Admitting: Physical Medicine & Rehabilitation

## 2021-08-29 ENCOUNTER — Other Ambulatory Visit: Payer: Self-pay

## 2021-08-29 VITALS — BP 114/75 | HR 80 | Temp 98.5°F | Wt 162.0 lb

## 2021-08-29 DIAGNOSIS — G8114 Spastic hemiplegia affecting left nondominant side: Secondary | ICD-10-CM | POA: Diagnosis not present

## 2021-08-29 DIAGNOSIS — I619 Nontraumatic intracerebral hemorrhage, unspecified: Secondary | ICD-10-CM | POA: Diagnosis not present

## 2021-08-29 NOTE — Progress Notes (Signed)
Dysport Injection for spasticity using needle EMG guidance  Dilution: 200 Units/ml Indication: Severe spasticity which interferes with ADL,mobility and/or  hygiene and is unresponsive to medication management and other conservative care Informed consent was obtained after describing risks and benefits of the procedure with the patient. This includes bleeding, bruising, infection, excessive weakness, or medication side effects. A REMS form is on file and signed. Needle:  needle electrode Number of units per muscle Left FCR: 200 units Left FCU: 100 units Left FDS: 300 units Left FDP: 300units  Left FPL  200units Left Med gastroc: 150 units   Left Lat gastroc: 150 units  Left FDL 100 All injections were done after obtaining appropriate EMG activity and after negative drawback for blood. The patient tolerated the procedure well. Post procedure instructions were given. A followup appointment was made.

## 2021-08-29 NOTE — Patient Instructions (Signed)

## 2021-09-01 ENCOUNTER — Other Ambulatory Visit: Payer: Self-pay

## 2021-09-01 ENCOUNTER — Other Ambulatory Visit (INDEPENDENT_AMBULATORY_CARE_PROVIDER_SITE_OTHER): Payer: Self-pay | Admitting: Family Medicine

## 2021-09-01 DIAGNOSIS — R7301 Impaired fasting glucose: Secondary | ICD-10-CM

## 2021-09-02 MED ORDER — BACLOFEN 10 MG PO TABS: 30.0000 mg | ORAL_TABLET | Freq: Three times a day (TID) | ORAL | 0 refills | Status: DC

## 2021-09-02 NOTE — Telephone Encounter (Signed)
Dr.Wallace °

## 2021-09-03 MED ORDER — TIRZEPATIDE 2.5 MG/0.5ML ~~LOC~~ SOAJ: 2.5000 mg | SUBCUTANEOUS | 0 refills | Status: DC

## 2021-09-04 ENCOUNTER — Ambulatory Visit: Payer: Medicare Other

## 2021-09-04 ENCOUNTER — Encounter (INDEPENDENT_AMBULATORY_CARE_PROVIDER_SITE_OTHER): Payer: Self-pay | Admitting: Family Medicine

## 2021-09-04 NOTE — Telephone Encounter (Signed)
Last OV with Dr Wallace 

## 2021-09-05 ENCOUNTER — Ambulatory Visit (INDEPENDENT_AMBULATORY_CARE_PROVIDER_SITE_OTHER): Payer: No Typology Code available for payment source

## 2021-09-05 ENCOUNTER — Other Ambulatory Visit: Payer: Self-pay

## 2021-09-05 DIAGNOSIS — Z23 Encounter for immunization: Secondary | ICD-10-CM

## 2021-09-25 ENCOUNTER — Other Ambulatory Visit: Payer: Self-pay

## 2021-09-25 ENCOUNTER — Ambulatory Visit (INDEPENDENT_AMBULATORY_CARE_PROVIDER_SITE_OTHER): Payer: No Typology Code available for payment source | Admitting: Family Medicine

## 2021-09-25 ENCOUNTER — Encounter (INDEPENDENT_AMBULATORY_CARE_PROVIDER_SITE_OTHER): Payer: Self-pay | Admitting: Family Medicine

## 2021-09-25 VITALS — BP 103/68 | HR 74 | Temp 98.0°F | Ht 59.0 in | Wt 152.0 lb

## 2021-09-25 DIAGNOSIS — Z6833 Body mass index (BMI) 33.0-33.9, adult: Secondary | ICD-10-CM

## 2021-09-25 DIAGNOSIS — Z8673 Personal history of transient ischemic attack (TIA), and cerebral infarction without residual deficits: Secondary | ICD-10-CM | POA: Diagnosis not present

## 2021-09-25 DIAGNOSIS — R7301 Impaired fasting glucose: Secondary | ICD-10-CM

## 2021-09-25 DIAGNOSIS — I10 Essential (primary) hypertension: Secondary | ICD-10-CM

## 2021-09-25 DIAGNOSIS — F3289 Other specified depressive episodes: Secondary | ICD-10-CM | POA: Diagnosis not present

## 2021-09-25 DIAGNOSIS — E669 Obesity, unspecified: Secondary | ICD-10-CM

## 2021-09-25 MED ORDER — TIRZEPATIDE 2.5 MG/0.5ML ~~LOC~~ SOAJ: 2.5000 mg | SUBCUTANEOUS | 0 refills | Status: DC

## 2021-09-29 ENCOUNTER — Encounter (INDEPENDENT_AMBULATORY_CARE_PROVIDER_SITE_OTHER): Payer: Self-pay | Admitting: Family Medicine

## 2021-09-29 NOTE — Telephone Encounter (Signed)
Dr.Wallace °

## 2021-09-30 ENCOUNTER — Ambulatory Visit (INDEPENDENT_AMBULATORY_CARE_PROVIDER_SITE_OTHER): Payer: No Typology Code available for payment source | Admitting: Internal Medicine

## 2021-09-30 ENCOUNTER — Ambulatory Visit: Payer: No Typology Code available for payment source | Attending: Internal Medicine

## 2021-09-30 ENCOUNTER — Other Ambulatory Visit: Payer: Self-pay

## 2021-09-30 ENCOUNTER — Encounter: Payer: Self-pay | Admitting: Internal Medicine

## 2021-09-30 VITALS — BP 108/70 | HR 70 | Temp 98.1°F | Resp 18 | Ht 59.0 in | Wt 157.1 lb

## 2021-09-30 DIAGNOSIS — I6930 Unspecified sequelae of cerebral infarction: Secondary | ICD-10-CM

## 2021-09-30 DIAGNOSIS — I10 Essential (primary) hypertension: Secondary | ICD-10-CM | POA: Diagnosis not present

## 2021-09-30 DIAGNOSIS — G4733 Obstructive sleep apnea (adult) (pediatric): Secondary | ICD-10-CM

## 2021-09-30 DIAGNOSIS — Z23 Encounter for immunization: Secondary | ICD-10-CM

## 2021-09-30 NOTE — Assessment & Plan Note (Signed)
Stroke sequela: Permanent parking permit signed Fatigue:As described above, just lack of energy, ROS negative except for feeling somewhat sleepy. Epworth scale: 11,  h/o OSA, intolerant to CPAP. Plan:  stop Lasix, monitor BPs, reassess on RTC. HTN: BP likely over controlled, see above, d/c lasix, cont carvedilol, Zestril. OSA: Recommend to keep appointment with Dr. Annamaria Boots 11/18/2021 DM: Managed elsewhere, off metformin, started [facility] RTC as scheduled for December.

## 2021-09-30 NOTE — Progress Notes (Signed)
Subjective:    Patient ID: Abigail Campos, female    DOB: January 18, 1971, 50 y.o.   MRN: [id number]  DOS:  09/30/2021 Type of visit - description: Here for gait assessment  Here for a DMV form.  She also complains of feeling very tired lately,  her BPs have been noted to be low lately. Denies any nausea, vomiting.  No blood in the stools No chest pain, difficulty breathing or DOE. Denies a snoring but she does feel sleepy throughout the day and take naps. No depression  BP Readings from Last 3 Encounters:  09/30/21 108/70  09/25/21 103/68  08/29/21 114/75     Review of Systems See above   Past Medical History:  Diagnosis Date   Anemia    Anxiety and depression    Back pain    Essential hypertension 10/19/2007   03-2010: metoprolol changed to bystolic (was not feeling well on it, no specific allergy or reaction)     Food allergy    Hemiplegia (HCC)    Hyperlipemia    Hypertension    Joint pain    Migraine    Neuromuscular disorder ([place])    left sided hemiplegia - has brace for l leg and walks with a cane   Obesity    Ovarian cancer ([place])    Prediabetes    Sleep apnea    not wearing c-pap yet   Sleep apnea    Stroke ([place]) 2017   Vitamin D deficiency     Past Surgical History:  Procedure Laterality Date   ABDOMINAL HYSTERECTOMY  11-14-07   no oophorectomy per surgical report   CESAREAN SECTION     [id number]   INGUINAL HERNIA REPAIR     2004    Allergies as of 09/30/2021       Reactions   Bee Venom Anaphylaxis   Peanut-containing Drug Products Anaphylaxis   Aspirin Other (See Comments)   Reports blisters w/ ASA but pt reports is ok w/ Motrin, Advil, naproxen   Isovue [iopamidol] Hives, Itching   Pt broke out with one facial hive.  Had itching on her right upper ant and posterior arm w/o evidence of hives.  Pt given water and 25mg  benadryl po.  We observed pt for 30 minutes before d/c.  J. Bohm     Latex Hives   Ambien [zolpidem Tartrate] Other (See  Comments)   forgetfulness   Hydrocodone Itching   Generalized itching w/o rash        Medication List        Accurate as of September 30, 2021  8:43 PM. If you have any questions, ask your nurse or doctor.          STOP taking these medications    furosemide 20 MG tablet Commonly known as: LASIX Stopped by: Kathlene November, MD   metFORMIN 500 MG tablet Commonly known as: GLUCOPHAGE Stopped by: Kathlene November, MD       TAKE these medications    atorvastatin 20 MG tablet Commonly known as: LIPITOR TAKE 1 TABLET BY MOUTH EVERY DAY   baclofen 10 MG tablet Commonly known as: LIORESAL Take 3 tablets (30 mg total) by mouth 3 (three) times daily.   buPROPion 100 MG tablet Commonly known as: WELLBUTRIN Take 100 mg by mouth 2 (two) times daily.   carvedilol 6.25 MG tablet Commonly known as: COREG Take 1 tablet (6.25 mg total) by mouth 2 (two) times daily with a meal.   clopidogrel 75 MG tablet  Commonly known as: PLAVIX Take 1 tablet (75 mg total) by mouth daily.   escitalopram 20 MG tablet Commonly known as: LEXAPRO TAKE 1 TABLET BY MOUTH EVERY DAY   gabapentin 300 MG capsule Commonly known as: NEURONTIN TAKE 1 CAPSULE BY MOUTH THREE TIMES A DAY   lisinopril 2.5 MG tablet Commonly known as: ZESTRIL Take 1 tablet (2.5 mg total) by mouth daily.   tirzepatide 2.5 MG/0.5ML Pen Commonly known as: MOUNJARO Inject 2.5 mg into the skin once a week.   traZODone 100 MG tablet Commonly known as: DESYREL Take 1 tablet (100 mg total) by mouth at bedtime as needed for sleep.   zaleplon 10 MG capsule Commonly known as: SONATA Take 1 capsule (10 mg total) by mouth at bedtime as needed for sleep.           Objective:   Physical Exam BP 108/70 (BP Location: Right Arm, Patient Position: Sitting, Cuff Size: Small)   Pulse 70   Temp 98.1 F (36.7 C) (Oral)   Resp 18   Ht 4\' 11"  (1.499 m)   Wt 157 lb 2 oz (71.3 kg)   SpO2 97%   BMI 31.74 kg/m  General:   Well  developed, NAD, BMI noted. HEENT:  Normocephalic . Face symmetric, atraumatic.  Not pale Lungs:  CTA B Normal respiratory effort, no intercostal retractions, no accessory muscle use. Heart: RRR,  no murmur.  Lower extremities: no pretibial edema bilaterally  Skin: Not pale. Not jaundice Neurologic:  alert & oriented X3.  Speech normal, gait at baseline, assisted by a cane,Consistent with previous stroke. Psych--  Cognition and judgment appear intact.  Cooperative with normal attention span and concentration.  Behavior appropriate. No anxious or depressed appearing.      Assessment      Assessment   Prediabetes: A1c 6.0 (April 2017)  HTN Hyperlipidemia Depression, insomnia GERD Stroke 03-2016: [place] (L)  spastic hemiplegia   ICH, right basilar ganglia due to HTN emergency, + encephalopathy, + dysphagia, aphasia, dysarthria Residual L spasticity.  CTA neck 03-2016 neg CTA head 10-17 (-) aneurysm Headaches , migraines dx after 2nd pregnancy, (-) CT head 2014 and 2015 Cough, persisting, saw Dr. Melvyn Novas 11-2015 OSA: Sleep study 08-2019, saw Dr. Annamaria Boots, intolerant to CPAP, referred to Dr. Ron Parker a dental appliance COVID infection 01-2021   PLAN Stroke sequela: Permanent parking permit signed Fatigue:As described above, just lack of energy, ROS negative except for feeling somewhat sleepy. Epworth scale: 11,  h/o OSA, intolerant to CPAP. Plan:  stop Lasix, monitor BPs, reassess on RTC. HTN: BP likely over controlled, see above, d/c lasix, cont carvedilol, Zestril. OSA: Recommend to keep appointment with Dr. Annamaria Boots 11/18/2021 DM: Managed elsewhere, off metformin, started [facility] RTC as scheduled for December.    This visit occurred during the SARS-CoV-2 public health emergency.  Safety protocols were in place, including screening questions prior to the visit, additional usage of staff PPE, and extensive cleaning of exam room while observing appropriate contact time as indicated for  disinfecting solutions.

## 2021-09-30 NOTE — Patient Instructions (Signed)
Stop Lasix  Check the  blood pressure 3 times a week BP GOAL is between 110/65 and  135/85. If it is consistently higher or lower, let me know  If you are not gradually better let me know.  Keep the appointments you have in December with Dr. Annamaria Boots (regarding sleep apnea)  and me

## 2021-09-30 NOTE — Progress Notes (Signed)
Chief Complaint:   OBESITY Abigail Campos is here to discuss her progress with her obesity treatment plan along with follow-up of her obesity related diagnoses. See Medical Weight Management Flowsheet for complete bioelectrical impedance results.  Today's visit was #: 17 Starting weight: 164 lbs Starting date: 12/26/2020 Weight change since last visit: 6 lbs Total lbs lost to date: 12 lbs Total weight loss percentage to date: -7.32%  Nutrition Plan:  Category 1 Meal Plan for 100% of the time. Activity: Increased walking. Anti-obesity medications: Mounjaro 2.5 mg subcutaneously weekly. Reported side effects: None.  Interim History: Abigail Campos says she is very happy with her progress.  She is tolerating Mounjaro without side effects.  Assessment/Plan:   1. Impaired fasting glucose, with polyphagia Controlled. Current treatment: Mounjaro 2.5 mg subcutaneously weekly.    Plan:  Continue Mounjaro 2.5 mg subcutaneously weekly.  Will refill for 3 months.  Will consider increasing to 5 mg after that. She will continue to focus on protein-rich, low simple carbohydrate foods. We reviewed the importance of hydration, regular exercise for stress reduction, and restorative sleep.  - Refill tirzepatide (MOUNJARO) 2.5 MG/0.5ML Pen; Inject 2.5 mg into the skin once a week.  Dispense: 6 mL; Refill: 0  2. Essential hypertension At goal. Medications: Coreg 6.25 mg twice daily, lisinopril 2.5 mg daily.   Plan: Avoid buying foods that are: processed, frozen, or prepackaged to avoid excess salt. We will watch for signs of hypotension as she continues lifestyle modifications.  BP Readings from Last 3 Encounters:  09/30/21 108/70  09/25/21 103/68  08/29/21 114/75   Lab Results  Component Value Date   CREATININE 0.94 05/20/2021   3. History of CVA (cerebrovascular accident) PCP manages Lipitor 20 mg daily. She has a history of CVA in 2017 with residual right-sided deficits.  4. Other depression, with  emotional eating Controlled. Medication: Wellbutrin 100 mg twice daily, Lexapro 20 mg daily.   Plan: Discussed cues and consequences, how thoughts affect eating, model of thoughts, feelings, and behaviors, and strategies for change by focusing on the cue. Discussed cognitive distortions, coping thoughts, and how to change your thoughts.  5. Obesity with current BMI of 30.8  Course: Abigail Campos is currently in the action stage of change. As such, her goal is to continue with weight loss efforts.   Nutrition goals: She has agreed to the Category 1 Plan.   Exercise goals:  As is.  Behavioral modification strategies: increasing lean protein intake, decreasing simple carbohydrates, increasing vegetables, and increasing water intake.  Abigail Campos has agreed to follow-up with our clinic in 4 weeks. She was informed of the importance of frequent follow-up visits to maximize her success with intensive lifestyle modifications for her multiple health conditions.   Objective:   Blood pressure 103/68, pulse 74, temperature 98 F (36.7 C), temperature source Oral, height 4\' 11"  (1.499 m), weight 152 lb (68.9 kg), SpO2 94 %. Body mass index is 30.7 kg/m.  General: Cooperative, alert, well developed, in no acute distress. HEENT: Conjunctivae and lids unremarkable. Cardiovascular: Regular rhythm.  Lungs: Normal work of breathing. Neurologic: No focal deficits.   Lab Results  Component Value Date   CREATININE 0.94 05/20/2021   BUN 11 05/20/2021   NA 138 05/20/2021   K 3.8 05/20/2021   CL 101 05/20/2021   CO2 26 05/20/2021   Lab Results  Component Value Date   ALT 34 (H) 12/26/2020   AST 27 12/26/2020   ALKPHOS 103 12/26/2020   BILITOT 0.4 12/26/2020  Lab Results  Component Value Date   HGBA1C 5.8 05/20/2021   HGBA1C 6.0 11/19/2020   HGBA1C 6.1 (H) 08/31/2020   HGBA1C 5.7 02/14/2020   HGBA1C 6.0 07/12/2019   Lab Results  Component Value Date   INSULIN 11.5 06/25/2021   Lab Results   Component Value Date   TSH 0.737 06/11/2021   Lab Results  Component Value Date   CHOL 127 06/25/2021   HDL 51 06/25/2021   LDLCALC 64 06/25/2021   TRIG 53 06/25/2021   CHOLHDL 2.5 06/25/2021   Lab Results  Component Value Date   VD25OH 33.3 06/25/2021   VD25OH 17.70 (L) 11/19/2020   Lab Results  Component Value Date   WBC 3.8 (L) 01/12/2021   HGB 13.2 01/12/2021   HCT 39.8 01/12/2021   MCV 92.3 01/12/2021   PLT 128 (L) 01/12/2021   Lab Results  Component Value Date   IRON 97 12/26/2020   TIBC 313 12/26/2020   FERRITIN 72 12/26/2020   Attestation Statements:   Reviewed by clinician on day of visit: allergies, medications, problem list, medical history, surgical history, family history, social history, and previous encounter notes.  I, Water quality scientist, CMA, am acting as transcriptionist for Briscoe Deutscher, DO  I have reviewed the above documentation for accuracy and completeness, and I agree with the above. -  Briscoe Deutscher, DO, MS, FAAFP, DABOM - Family and Bariatric Medicine.

## 2021-09-30 NOTE — Progress Notes (Signed)
   Covid-19 Vaccination Clinic  Name:  ALEJANDRINA RAIMER    MRN: [id number] DOB: Mar 16, 1971  09/30/2021  Ms. Conrad was observed post Covid-19 immunization for 15 minutes without incident. She was provided with Vaccine Information Sheet and instruction to access the V-Safe system.   Ms. Milstein was instructed to call 911 with any severe reactions post vaccine: Difficulty breathing  Swelling of face and throat  A fast heartbeat  A bad rash all over body  Dizziness and weakness

## 2021-10-11 ENCOUNTER — Other Ambulatory Visit: Payer: Self-pay | Admitting: Internal Medicine

## 2021-10-12 ENCOUNTER — Other Ambulatory Visit (INDEPENDENT_AMBULATORY_CARE_PROVIDER_SITE_OTHER): Payer: Self-pay | Admitting: Family Medicine

## 2021-10-12 DIAGNOSIS — R7303 Prediabetes: Secondary | ICD-10-CM

## 2021-10-13 NOTE — Telephone Encounter (Signed)
Pt last seen by Dr. Wallace.  

## 2021-10-17 ENCOUNTER — Other Ambulatory Visit: Payer: Self-pay

## 2021-10-17 ENCOUNTER — Encounter: Payer: Self-pay | Admitting: Physical Medicine & Rehabilitation

## 2021-10-17 ENCOUNTER — Encounter
Payer: No Typology Code available for payment source | Attending: Physical Medicine & Rehabilitation | Admitting: Physical Medicine & Rehabilitation

## 2021-10-17 VITALS — BP 127/86 | HR 75 | Ht 59.0 in | Wt 154.0 lb

## 2021-10-17 DIAGNOSIS — G8110 Spastic hemiplegia affecting unspecified side: Secondary | ICD-10-CM | POA: Insufficient documentation

## 2021-10-17 MED ORDER — BACLOFEN 10 MG PO TABS: 30.0000 mg | ORAL_TABLET | Freq: Three times a day (TID) | ORAL | 0 refills | Status: DC

## 2021-10-17 NOTE — Patient Instructions (Signed)
Please ask your husband whether insurance would allow codes [phone] and [phone] to be billed the same date of service

## 2021-10-17 NOTE — Progress Notes (Signed)
Subjective:    Patient ID: Abigail Campos, female    DOB: [birth year], 50 y.o.   MRN: [id number]  HPI Pt notes still having tightness in Left hand  and Left foot, the patient thinks that the botulinum toxin does not work as well as it used to on her. Patient still has difficulty opening up the fingers on the left hand no problems at the elbow. In the left lower extremity continues have problems with foot pointing downward. 08/29/2021 Dysport injection Left FCR: 200 units Left FCU: 100 units Left FDS: 300 units Left FDP: 300units  Left FPL  200units Left Med gastroc: 150 units   Left Lat gastroc: 150 units  Left FDL 100  Pain Inventory Average Pain 6 Pain Right Now 6 My pain is constant and aching  LOCATION OF PAIN  hand, knee, leg  BOWEL Number of stools per week: 3 Oral laxative use No  Type of laxative . Enema or suppository use No  History of colostomy No  Incontinent   BLADDER Normal In and out cath, frequency na Able to self cath  na Bladder incontinence No  Frequent urination No  Leakage with coughing No  Difficulty starting stream Yes  Incomplete bladder emptying No    Mobility use a walker ability to climb steps?  yes do you drive?  no  Function disabled: date disabled . I need assistance with the following:  bathing  Neuro/Psych spasms dizziness depression anxiety  Prior Studies Any changes since last visit?  no  Physicians involved in your care Primary care Dr. Larose Kells   Family History  Problem Relation Age of Onset   Diabetes Mother    Hypertension Mother    Glaucoma Mother    Colon cancer Mother        mother age 39   Cancer Mother    Anxiety disorder Mother    Obesity Mother    Rectal cancer Father        dx 107   Hypertension Father    Cancer Father    Sleep apnea Father    Alcoholism Father    Obesity Father    Stroke Paternal Grandfather    Stroke Maternal Grandfather    Diabetes Brother    Hypertension Brother     Heart disease Maternal Aunt        x 2 did 2012 CAD-CHF   Diabetes Brother    Hypertension Brother    Lung cancer Maternal Aunt    Breast cancer Neg Hx    Esophageal cancer Neg Hx    Stomach cancer Neg Hx    Social History   Socioeconomic History   Marital status: Married    Spouse name: [name]   Number of children: 2   Years of education: Not on file   Highest education level: Not on file  Occupational History   Occupation: disable d/t stroke TEACHER    Employer: [employer]  Tobacco Use   Smoking status: Never   Smokeless tobacco: Never  Vaping Use   Vaping Use: Never used  Substance and Sexual Activity   Alcohol use: No   Drug use: No   Sexual activity: Not Currently    Birth control/protection: Surgical  Other Topics Concern   Not on file  Social History Narrative   Lives with husband and son   Left Hand prior to stroke   Drinks 1-2 cups daily   Social Determinants of Health   Financial Resource Strain: Not on file  Food Insecurity: Not on file  Transportation Needs: Not on file  Physical Activity: Not on file  Stress: Not on file  Social Connections: Not on file   Past Surgical History:  Procedure Laterality Date   ABDOMINAL HYSTERECTOMY  11-14-07   no oophorectomy per surgical report   CESAREAN SECTION     [id number]   INGUINAL HERNIA REPAIR     2004   Past Medical History:  Diagnosis Date   Anemia    Anxiety and depression    Back pain    Essential hypertension 10/19/2007   03-2010: metoprolol changed to bystolic (was not feeling well on it, no specific allergy or reaction)     Food allergy    Hemiplegia (HCC)    Hyperlipemia    Hypertension    Joint pain    Migraine    Neuromuscular disorder ([place])    left sided hemiplegia - has brace for l leg and walks with a cane   Obesity    Ovarian cancer ([place])    Prediabetes    Sleep apnea    not wearing c-pap yet   Sleep apnea    Stroke ([place]) 2017   Vitamin D deficiency    BP 127/86   Pulse 75    Ht 4\' 11"  (1.499 m)   Wt 154 lb (69.9 kg)   SpO2 95%   BMI 31.10 kg/m   Opioid Risk Score:   Fall Risk Score:  `1  Depression screen PHQ 2/9  Depression screen [facility] 2/9 09/30/2021 08/29/2021 07/01/2021 05/20/2021 12/26/2020 11/19/2020 10/03/2020  Decreased Interest 1 1 1 2  0 1 0  Down, Depressed, Hopeless 1 1 1 2 2 1  0  PHQ - 2 Score 2 2 2 4 2 2  0  Altered sleeping 3 - - 3 3 3  -  Tired, decreased energy 1 - - 1 3 2  -  Change in appetite 0 - - 0 2 2 -  Feeling bad or failure about yourself  1 - - 1 2 2  -  Trouble concentrating 3 - - 3 1 0 -  Moving slowly or fidgety/restless 0 - - 1 2 1  -  Suicidal thoughts 0 - - 0 1 1 -  PHQ-9 Score 10 - - 13 16 13  -  Difficult doing work/chores - - - Somewhat difficult Somewhat difficult Somewhat difficult -  Some recent data might be hidden     Review of Systems  Musculoskeletal:        Hand, knee, leg pain  All other systems reviewed and are negative.     Objective:   Physical Exam Vitals and nursing note reviewed.  Constitutional:      Appearance: She is normal weight.  HENT:     Head: Normocephalic and atraumatic.  Eyes:     Extraocular Movements: Extraocular movements intact.     Conjunctiva/sclera: Conjunctivae normal.     Pupils: Pupils are equal, round, and reactive to light.  Skin:    General: Skin is warm and dry.  Neurological:     General: No focal deficit present.     Mental Status: She is alert and oriented to person, place, and time.     Comments: Motor strength is 3 - at the left deltoid and bicep 0 at the finger flexors and extensors as well as wrist flexors and extensors Tone is MAS 3 at the finger flexors on the left as well as thumb flexor on the left. MAS 2 at the wrist flexor on  the left Left lower extremity 3 - hip flexor 4 at the knee extensor trace ankle dorsiflexor MAS 3/4 tone at the ankle plantar flexors  Psychiatric:        Mood and Affect: Mood normal.        Behavior: Behavior normal.           Assessment & Plan:  #1.  Left spastic hemiplegia secondary right CVA, tone continues to be an issue particularly in the left finger and thumb flexors as well as left ankle plantar flexors. We will inject phenol 5% left tibial nerve for neurolysis as an alternative given the limited effectiveness of botulinum toxin with this patient thus far. In the upper limb would recommend ultrasound-guided botulinum toxin injection will schedule.  Not sure if these can be done on the same day from an insurance standpoint.

## 2021-10-22 ENCOUNTER — Other Ambulatory Visit: Payer: Self-pay | Admitting: Internal Medicine

## 2021-10-22 ENCOUNTER — Other Ambulatory Visit: Payer: Self-pay | Admitting: Physical Medicine & Rehabilitation

## 2021-10-23 ENCOUNTER — Ambulatory Visit (INDEPENDENT_AMBULATORY_CARE_PROVIDER_SITE_OTHER): Payer: No Typology Code available for payment source | Admitting: Family Medicine

## 2021-10-23 ENCOUNTER — Encounter (INDEPENDENT_AMBULATORY_CARE_PROVIDER_SITE_OTHER): Payer: Self-pay

## 2021-10-24 ENCOUNTER — Telehealth: Payer: Self-pay | Admitting: Internal Medicine

## 2021-10-24 NOTE — Telephone Encounter (Signed)
Anderson Malta from [facility] care management called to leave her number in case there is anything she can do to help pt. Anderson Malta can be reached at [phone]

## 2021-10-28 ENCOUNTER — Other Ambulatory Visit: Payer: Self-pay

## 2021-10-28 ENCOUNTER — Encounter (INDEPENDENT_AMBULATORY_CARE_PROVIDER_SITE_OTHER): Payer: Self-pay | Admitting: Family Medicine

## 2021-10-28 ENCOUNTER — Ambulatory Visit (INDEPENDENT_AMBULATORY_CARE_PROVIDER_SITE_OTHER): Payer: No Typology Code available for payment source | Admitting: Family Medicine

## 2021-10-28 ENCOUNTER — Other Ambulatory Visit (HOSPITAL_BASED_OUTPATIENT_CLINIC_OR_DEPARTMENT_OTHER): Payer: Self-pay

## 2021-10-28 VITALS — BP 123/83 | HR 86 | Temp 97.9°F | Ht 59.0 in | Wt 150.0 lb

## 2021-10-28 DIAGNOSIS — F3289 Other specified depressive episodes: Secondary | ICD-10-CM

## 2021-10-28 DIAGNOSIS — E669 Obesity, unspecified: Secondary | ICD-10-CM | POA: Diagnosis not present

## 2021-10-28 DIAGNOSIS — Z8673 Personal history of transient ischemic attack (TIA), and cerebral infarction without residual deficits: Secondary | ICD-10-CM

## 2021-10-28 DIAGNOSIS — R7303 Prediabetes: Secondary | ICD-10-CM

## 2021-10-28 DIAGNOSIS — Z6833 Body mass index (BMI) 33.0-33.9, adult: Secondary | ICD-10-CM

## 2021-10-28 MED ORDER — PFIZER COVID-19 VAC BIVALENT 30 MCG/0.3ML IM SUSP
INTRAMUSCULAR | 0 refills | Status: DC
Start: 2021-09-30 — End: 2021-11-24
  Filled 2021-10-28: qty 0.3, 1d supply, fill #0

## 2021-10-29 ENCOUNTER — Ambulatory Visit
Payer: No Typology Code available for payment source | Attending: Physical Medicine & Rehabilitation | Admitting: Physical Therapy

## 2021-10-29 ENCOUNTER — Encounter: Payer: Self-pay | Admitting: Physical Therapy

## 2021-10-29 DIAGNOSIS — R29818 Other symptoms and signs involving the nervous system: Secondary | ICD-10-CM | POA: Diagnosis not present

## 2021-10-29 DIAGNOSIS — I69352 Hemiplegia and hemiparesis following cerebral infarction affecting left dominant side: Secondary | ICD-10-CM

## 2021-10-29 DIAGNOSIS — Z9181 History of falling: Secondary | ICD-10-CM | POA: Diagnosis not present

## 2021-10-29 DIAGNOSIS — M6281 Muscle weakness (generalized): Secondary | ICD-10-CM | POA: Diagnosis not present

## 2021-10-29 DIAGNOSIS — R2689 Other abnormalities of gait and mobility: Secondary | ICD-10-CM

## 2021-10-29 DIAGNOSIS — I69359 Hemiplegia and hemiparesis following cerebral infarction affecting unspecified side: Secondary | ICD-10-CM | POA: Diagnosis not present

## 2021-10-29 DIAGNOSIS — G8110 Spastic hemiplegia affecting unspecified side: Secondary | ICD-10-CM | POA: Insufficient documentation

## 2021-10-29 DIAGNOSIS — R269 Unspecified abnormalities of gait and mobility: Secondary | ICD-10-CM | POA: Diagnosis not present

## 2021-10-29 MED ORDER — TIRZEPATIDE 5 MG/0.5ML ~~LOC~~ SOAJ: 5.0000 mg | SUBCUTANEOUS | 0 refills | Status: DC

## 2021-10-29 NOTE — Progress Notes (Signed)
Chief Complaint:   OBESITY Abigail Campos is here to discuss her progress with her obesity treatment plan along with follow-up of her obesity related diagnoses. See Medical Weight Management Flowsheet for complete bioelectrical impedance results.  Today's visit was #: 14 Starting weight: 164 lbs Starting date: 12/26/2020 Weight change since last visit: 2 lbs Total lbs lost to date: 14 lbs Total weight loss percentage to date: -8.54%  Nutrition Plan: Category 1 Meal Plan for 100% of the time.  Activity: Walking for 10-30 minutes 3 times per week.  Anti-obesity medications: Mounjaro 2.5 mg subcutaneously weekly. Reported side effects: None.  Interim History: Abigail Campos found out that her dad had an MI during her drive here.  He sounds stable, and she says she is feeling okay.  She is proud that she is not panicking.  She endorses increased polyphagia at the end of the week.  Assessment/Plan:   1. Prediabetes Improving, but not optimized. Goal is HgbA1c < 5.7.  Medication: Mounjaro 2.5 mg subcutaneously weekly.    Plan:  Increase Mounjaro to 5 mg subcutaneously weekly.  Will send new prescription to pharmacy today.  She will continue to focus on protein-rich, low simple carbohydrate foods. We reviewed the importance of hydration, regular exercise for stress reduction, and restorative sleep.   Lab Results  Component Value Date   HGBA1C 5.8 05/20/2021   Lab Results  Component Value Date   INSULIN 11.5 06/25/2021   2. History of CVA (cerebrovascular accident) PCP manages Lipitor 20 mg daily. She has a history of CVA in 2017 with residual right-sided deficits.  3. Other depression, with emotional eating Controlled. Medication: Wellbutrin 100 mg daily, Lexapro 20 mg daily.   Plan: Discussed cues and consequences, how thoughts affect eating, model of thoughts, feelings, and behaviors, and strategies for change by focusing on the cue. Discussed cognitive distortions, coping thoughts, and how to  change your thoughts.  4. Obesity BMI today is 30  Course: Abigail Campos is currently in the action stage of change. As such, her goal is to continue with weight loss efforts.   Nutrition goals: She has agreed to the Category 1 Plan.   Exercise goals:  As is.  Behavioral modification strategies: increasing lean protein intake, decreasing simple carbohydrates, increasing vegetables, increasing water intake, and emotional eating strategies.  Abigail Campos has agreed to follow-up with our clinic in 4 weeks. She was informed of the importance of frequent follow-up visits to maximize her success with intensive lifestyle modifications for her multiple health conditions.   Objective:   Blood pressure 123/83, pulse 86, temperature 97.9 F (36.6 C), temperature source Oral, height 4\' 11"  (1.499 m), weight 150 lb (68 kg), SpO2 94 %. Body mass index is 30.3 kg/m.  General: Cooperative, alert, well developed, in no acute distress. HEENT: Conjunctivae and lids unremarkable. Cardiovascular: Regular rhythm.  Lungs: Normal work of breathing. Neurologic: No focal deficits.   Lab Results  Component Value Date   CREATININE 0.94 05/20/2021   BUN 11 05/20/2021   NA 138 05/20/2021   K 3.8 05/20/2021   CL 101 05/20/2021   CO2 26 05/20/2021   Lab Results  Component Value Date   ALT 34 (H) 12/26/2020   AST 27 12/26/2020   ALKPHOS 103 12/26/2020   BILITOT 0.4 12/26/2020   Lab Results  Component Value Date   HGBA1C 5.8 05/20/2021   HGBA1C 6.0 11/19/2020   HGBA1C 6.1 (H) 08/31/2020   HGBA1C 5.7 02/14/2020   HGBA1C 6.0 07/12/2019   Lab  Results  Component Value Date   INSULIN 11.5 06/25/2021   Lab Results  Component Value Date   TSH 0.737 06/11/2021   Lab Results  Component Value Date   CHOL 127 06/25/2021   HDL 51 06/25/2021   LDLCALC 64 06/25/2021   TRIG 53 06/25/2021   CHOLHDL 2.5 06/25/2021   Lab Results  Component Value Date   VD25OH 33.3 06/25/2021   VD25OH 17.70 (L) 11/19/2020   Lab  Results  Component Value Date   WBC 3.8 (L) 01/12/2021   HGB 13.2 01/12/2021   HCT 39.8 01/12/2021   MCV 92.3 01/12/2021   PLT 128 (L) 01/12/2021   Lab Results  Component Value Date   IRON 97 12/26/2020   TIBC 313 12/26/2020   FERRITIN 72 12/26/2020   Attestation Statements:   Reviewed by clinician on day of visit: allergies, medications, problem list, medical history, surgical history, family history, social history, and previous encounter notes.  I, Water quality scientist, CMA, am acting as transcriptionist for Briscoe Deutscher, DO  I have reviewed the above documentation for accuracy and completeness, and I agree with the above. -  Briscoe Deutscher, DO, MS, FAAFP, DABOM - Family and Bariatric Medicine.

## 2021-10-29 NOTE — Therapy (Signed)
[facility]. [place], [place], [address] Phone: [phone]   Fax:  [phone]  Physical Therapy Evaluation  Patient Details  Name: Abigail Campos MRN: [id number] Date of Birth: [birth year] Referring Provider (PT): Dr Letta Pate   Encounter Date: 10/29/2021   PT End of Session - 10/29/21 1423     Visit Number 1    Number of Visits 17    Date for PT Re-Evaluation 12/24/21    PT Start Time 9798    PT Stop Time 1319    PT Time Calculation (min) 46 min    Equipment Utilized During Treatment Gait belt    Activity Tolerance Patient tolerated treatment well    Behavior During Therapy [facility] for tasks assessed/performed             Past Medical History:  Diagnosis Date   Anemia    Anxiety and depression    Back pain    Essential hypertension 10/19/2007   03-2010: metoprolol changed to bystolic (was not feeling well on it, no specific allergy or reaction)     Food allergy    Hemiplegia ([place])    Hyperlipemia    Hypertension    Joint pain    Migraine    Neuromuscular disorder ([place])    left sided hemiplegia - has brace for l leg and walks with a cane   Obesity    Ovarian cancer ([place])    Prediabetes    Sleep apnea    not wearing c-pap yet   Sleep apnea    Stroke ([place]) 2017   Vitamin D deficiency     Past Surgical History:  Procedure Laterality Date   ABDOMINAL HYSTERECTOMY  11-14-07   no oophorectomy per surgical report   CESAREAN SECTION     9211,9417   INGUINAL HERNIA REPAIR     2004    There were no vitals filed for this visit.    Subjective Assessment - 10/29/21 1234     Subjective Patient arrives today, referred due to falls as well as pain on her L shin from her current AFO. She fell Monday while in the shower, got off balance while washing.    Pertinent History PMH: HTN, hx of CVA (2017), ovarian cancer)    Limitations Walking;Standing    How long can you sit comfortably? Sore buttocks  after fall.    How long can you stand comfortably? up to 35 minutes.    How long can you walk comfortably? Walks for exercise, tries to get 3000 steps per day.    Patient Stated Goals Stand longer and better, [facility] wihtout a cane, but wearing brace.    Currently in Pain? No/denies                [facility] PT Assessment - 10/29/21 0001       Assessment   Medical Diagnosis CVA with L sided Weakness    Referring Provider (PT) Dr Letta Pate    Hand Dominance Left    Next MD Visit Dec 19 2021    Prior Therapy Extensive rehab at the time of CVA      Precautions   Required Braces or Orthoses --   L AFO   Other Brace/Splint L AFO, wraps around front of shin.      Balance Screen   Has the patient fallen in the past 6 months Yes    How many times? 6    Has the patient had a decrease  in activity level because of a fear of falling?  No    Is the patient reluctant to leave their home because of a fear of falling?  No      Home [employer] residence    Living Arrangements Spouse/significant other      Prior Function   Level of [place] and crafts, cooking, walking      Cognition   Overall Cognitive Status Within Functional Limits for tasks assessed      Posture/Postural Control   Posture Comments Patient stands with increased WB through RLE.      ROM / Strength   AROM / PROM / Strength PROM      PROM   Overall PROM Comments RLE and L knee and hip WFL    PROM Assessment Site Ankle    Right/Left Ankle Left    Left Ankle Dorsiflexion -3    Left Ankle Plantar Flexion WFL    Left Ankle Inversion WFL    Left Ankle Eversion WFL      Strength   Overall Strength Comments RLE strength WNL, no active movement elicited in L ankle.    Left Hip Flexion 3+/5    Left Hip Extension 2+/5    Left Hip External Rotation 2+/5    Left Hip Internal Rotation 2+/5    Left Hip ABduction 2/5    Left Knee Flexion 3-/5    Left Knee  Extension 3+/5    Left Ankle Dorsiflexion 1/5    Left Ankle Plantar Flexion 1/5    Left Ankle Inversion 0/5    Left Ankle Eversion 0/5      Transfers   Five time sit to stand comments  23.87 sec      Ambulation/Gait   Ambulation/Gait Yes    Ambulation/Gait Assistance 6: Modified independent (Device/Increase time)    Ambulation Distance (Feet) 100 Feet    Assistive device Straight cane    Gait Pattern Step-to pattern;Decreased step length - left;Decreased stance time - left;Decreased stride length;Decreased hip/knee flexion - left;Decreased dorsiflexion - left;Decreased weight shift to left;Lateral hip instability;Lateral trunk lean to right;Poor foot clearance - left    Ambulation Surface Level;Indoor      Timed Up and Go Test   Normal TUG (seconds) 24.7                        Objective measurements completed on examination: See above findings.                PT Education - 10/29/21 1422     Education Details POC to address strength, balance, AFO.    Person(s) Educated Patient    Methods Explanation    Comprehension Verbalized understanding              PT Short Term Goals - 10/29/21 1429       PT SHORT TERM GOAL #1   Title Patient will be I with basic HEP    Baseline TBD    Time 4    Period Weeks    Status New    Target Date 11/26/21               PT Long Term Goals - 10/29/21 1432       PT LONG TERM GOAL #1   Title Patient to be independent with final HEP for improved functional mobility, decreased fall risk.    Time 8  Period Weeks    Status New    Target Date 12/24/21      PT LONG TERM GOAL #2   Title Decrease time for 5 x sit to stand test to < 20 seconds.    Baseline 23.87    Time 8    Period Weeks    Status New    Target Date 12/24/21      PT LONG TERM GOAL #3   Title Pt will decr TUG time to 20 seconds or less with SPC with quad tip in order to demo decr fall risk.    Baseline 24.70    Time 8    Period  Weeks    Status New    Target Date 12/24/21      PT LONG TERM GOAL #4   Title Pt will ambulate at least 300' over indoor level and outdoor unlevel surfaces with LRAD with supervision in order to demo improved community mobility.    Baseline 100' with cane on level surfaces iwth multiple gait deviations.    Time 8    Period Weeks    Status New    Target Date 12/24/21                    Plan - 10/29/21 1424     Clinical Impression Statement Patient arrives after multiple falls at home. She also reports irritaiton on front of L shin due to current AFO. She says she has talked to [place] at [place] about being fitted for a new AFO. She demosntrates weakness, slow movements, decreased coordination, all of which contribute to increased fall risk. She would like to be able to walk without her cane, would like to decrease falls, and would like to improve fit of current AFO or update to a new one. Current AFO crosses ant to shin and does provide some knee stability oin stance.    Personal Factors and Comorbidities Comorbidity 3+;Time since onset of injury/illness/exacerbation;Fitness;Past/Current Experience    Comorbidities HTN, hx of CVA (2017), ovarian cancer    Examination-Activity Limitations Locomotion Level;Stand;Transfers;Caring for Others    Examination-Participation Restrictions Community Activity    Stability/Clinical Decision Making Stable/Uncomplicated    Clinical Decision Making Low    Rehab Potential Good    PT Frequency 2x / week    PT Duration 8 weeks    PT Treatment/Interventions ADLs/Self Care Home Management;Aquatic Therapy;DME Instruction;Gait training;Functional mobility training;Therapeutic activities;Stair training;Therapeutic exercise;Balance training;Neuromuscular re-education;Orthotic Fit/Training;Patient/family education;Passive range of motion;Dry needling;Manual techniques;Iontophoresis 4mg /ml Dexamethasone;Electrical Stimulation    PT Next Visit Plan Progress  with strengthening, balance training. Establish HEP to support gains. Contact Chris at [employer].    Consulted and Agree with Plan of Care Patient             Patient will benefit from skilled therapeutic intervention in order to improve the following deficits and impairments:  Abnormal gait, Decreased coordination, Decreased range of motion, Difficulty walking, Increased fascial restricitons, Impaired tone, Decreased endurance, Increased muscle spasms, Decreased activity tolerance, Pain, Decreased balance, Impaired flexibility, Improper body mechanics, Decreased mobility, Decreased strength, Impaired sensation, Postural dysfunction  Visit Diagnosis: Muscle weakness (generalized)  Other symptoms and signs involving the nervous system  Abnormality of gait  Hemiplegia and hemiparesis following cerebral infarction affecting left dominant side (HCC)  Hemiplegia, post-stroke (HCC)  History of falling  Other abnormalities of gait and mobility     Problem List Patient Active Problem List   Diagnosis Date Noted   Nausea without vomiting 07/23/2021  Vitamin D deficiency 07/06/2021   History of CVA (cerebrovascular accident) 07/06/2021   Other constipation 03/18/2021   Hot flashes 03/17/2021   Prediabetes 02/11/2021   COVID-19 48/88/9169   Metabolic syndrome 45/02/8881   Neuropathic pain 09/19/2020   Adjustment disorder with mixed anxiety and depressed mood 09/19/2020   Facial droop 08/30/2020   Abnormality of gait 05/09/2020   Hemiplegia of nondominant side, late effect of cerebrovascular disease ([place]) 03/28/2020   Pleural effusion 03/01/2020   Sleep disturbance 02/06/2020   Spastic hemiplegia of left nondominant side due to nontraumatic intraparenchymal hemorrhage of brain ([place]) 12/26/2019   Trigger thumb of right hand 11/02/2019   OSA (obstructive sleep apnea) 07/26/2019   Spastic hemiplegia of left nondominant side as late effect of nontraumatic intraparenchymal hemorrhage  of brain ([place]) 04/06/2019   Spastic hemiparesis affecting dominant side ([place]) 01/04/2018   Hyperglycemia 12/29/2016   Depression 12/29/2016   Muscle spasticity    Class 1 obesity with serious comorbidity and body mass index (BMI) of 33.0 to 33.9 in adult 04/08/2016   Hyperlipidemia 04/08/2016   Basal ganglia hemorrhage ([place]) 04/08/2016   Dysphagia as late effect of cerebrovascular disease    Migraine with aura and without status migrainosus, not intractable    Gait disturbance, post-stroke    Hemiplegia, post-stroke (HCC)    Aphasia, post-stroke    Dysarthria, post-stroke    ICH (intracerebral hemorrhage) ([place]) - R basal ganglia due to hypertensive emergency 04/01/2016   Upper airway cough syndrome 12/06/2015   PCP NOTES >>>>>>>>>>>>>>>>>>>>>>>>>>>.. 09/25/2015   Insomnia 12/28/2012   Annual physical exam 11/23/2011   Essential hypertension 10/19/2007    Marcelina Morel, DPT 10/29/2021, 2:37 PM  [place]. [place], [place], [address] Phone: [phone]   Fax:  [phone]  Name: Abigail Campos MRN: [id number] Date of Birth: 1971/07/17

## 2021-10-31 ENCOUNTER — Other Ambulatory Visit: Payer: Self-pay

## 2021-10-31 ENCOUNTER — Emergency Department (HOSPITAL_BASED_OUTPATIENT_CLINIC_OR_DEPARTMENT_OTHER)
Admission: EM | Admit: 2021-10-31 | Discharge: 2021-11-01 | Disposition: A | Discharge status: Home / Self Care | Payer: No Typology Code available for payment source | Attending: Emergency Medicine | Admitting: Emergency Medicine

## 2021-10-31 ENCOUNTER — Encounter (HOSPITAL_BASED_OUTPATIENT_CLINIC_OR_DEPARTMENT_OTHER): Payer: Self-pay | Admitting: *Deleted

## 2021-10-31 DIAGNOSIS — Z8616 Personal history of COVID-19: Secondary | ICD-10-CM | POA: Diagnosis not present

## 2021-10-31 DIAGNOSIS — S4992XA Unspecified injury of left shoulder and upper arm, initial encounter: Secondary | ICD-10-CM | POA: Diagnosis present

## 2021-10-31 DIAGNOSIS — S42032A Displaced fracture of lateral end of left clavicle, initial encounter for closed fracture: Secondary | ICD-10-CM

## 2021-10-31 DIAGNOSIS — W01198A Fall on same level from slipping, tripping and stumbling with subsequent striking against other object, initial encounter: Secondary | ICD-10-CM | POA: Diagnosis not present

## 2021-10-31 DIAGNOSIS — Y92009 Unspecified place in unspecified non-institutional (private) residence as the place of occurrence of the external cause: Secondary | ICD-10-CM | POA: Diagnosis not present

## 2021-10-31 DIAGNOSIS — Z8543 Personal history of malignant neoplasm of ovary: Secondary | ICD-10-CM | POA: Diagnosis not present

## 2021-10-31 DIAGNOSIS — W19XXXA Unspecified fall, initial encounter: Secondary | ICD-10-CM

## 2021-10-31 DIAGNOSIS — I10 Essential (primary) hypertension: Secondary | ICD-10-CM | POA: Diagnosis not present

## 2021-10-31 DIAGNOSIS — Z7902 Long term (current) use of antithrombotics/antiplatelets: Secondary | ICD-10-CM | POA: Insufficient documentation

## 2021-10-31 DIAGNOSIS — Z79899 Other long term (current) drug therapy: Secondary | ICD-10-CM | POA: Insufficient documentation

## 2021-10-31 NOTE — ED Notes (Signed)
Patient further states hx previous stroke on left side.

## 2021-10-31 NOTE — ED Triage Notes (Signed)
Pt reports syncopal episode x 2 hrs ago causing her to fall off porch and hit neck and head on concrete. C/o left clavicle pain

## 2021-11-01 ENCOUNTER — Emergency Department (HOSPITAL_BASED_OUTPATIENT_CLINIC_OR_DEPARTMENT_OTHER): Payer: No Typology Code available for payment source

## 2021-11-01 IMAGING — DX DG SHOULDER 2+V*L*
3 series · 3 of 3 positions shown · non-contrast
Comparison: None.

CLINICAL DATA: Recent fall with left shoulder pain, initial
encounter

EXAM:
LEFT SHOULDER - 2+ VIEW

[shoulder grashey]
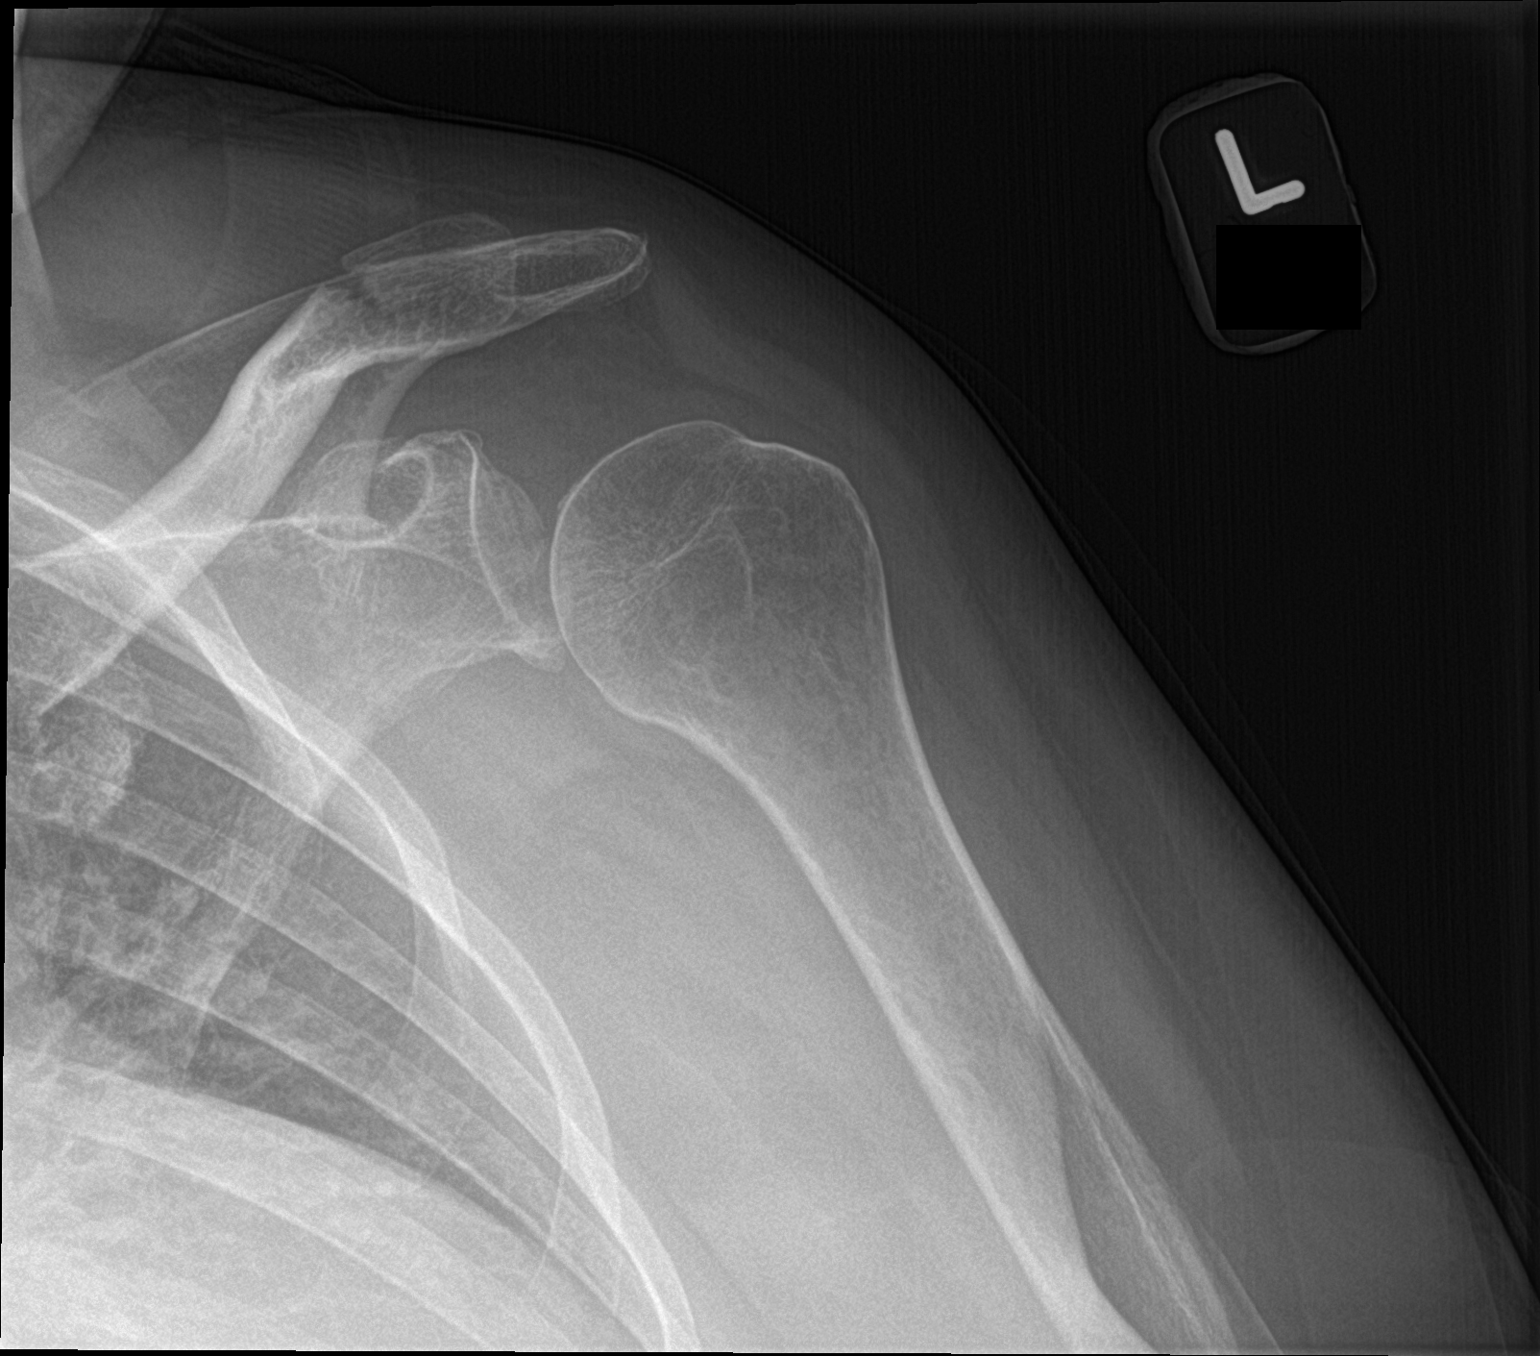

[shoulder y view]
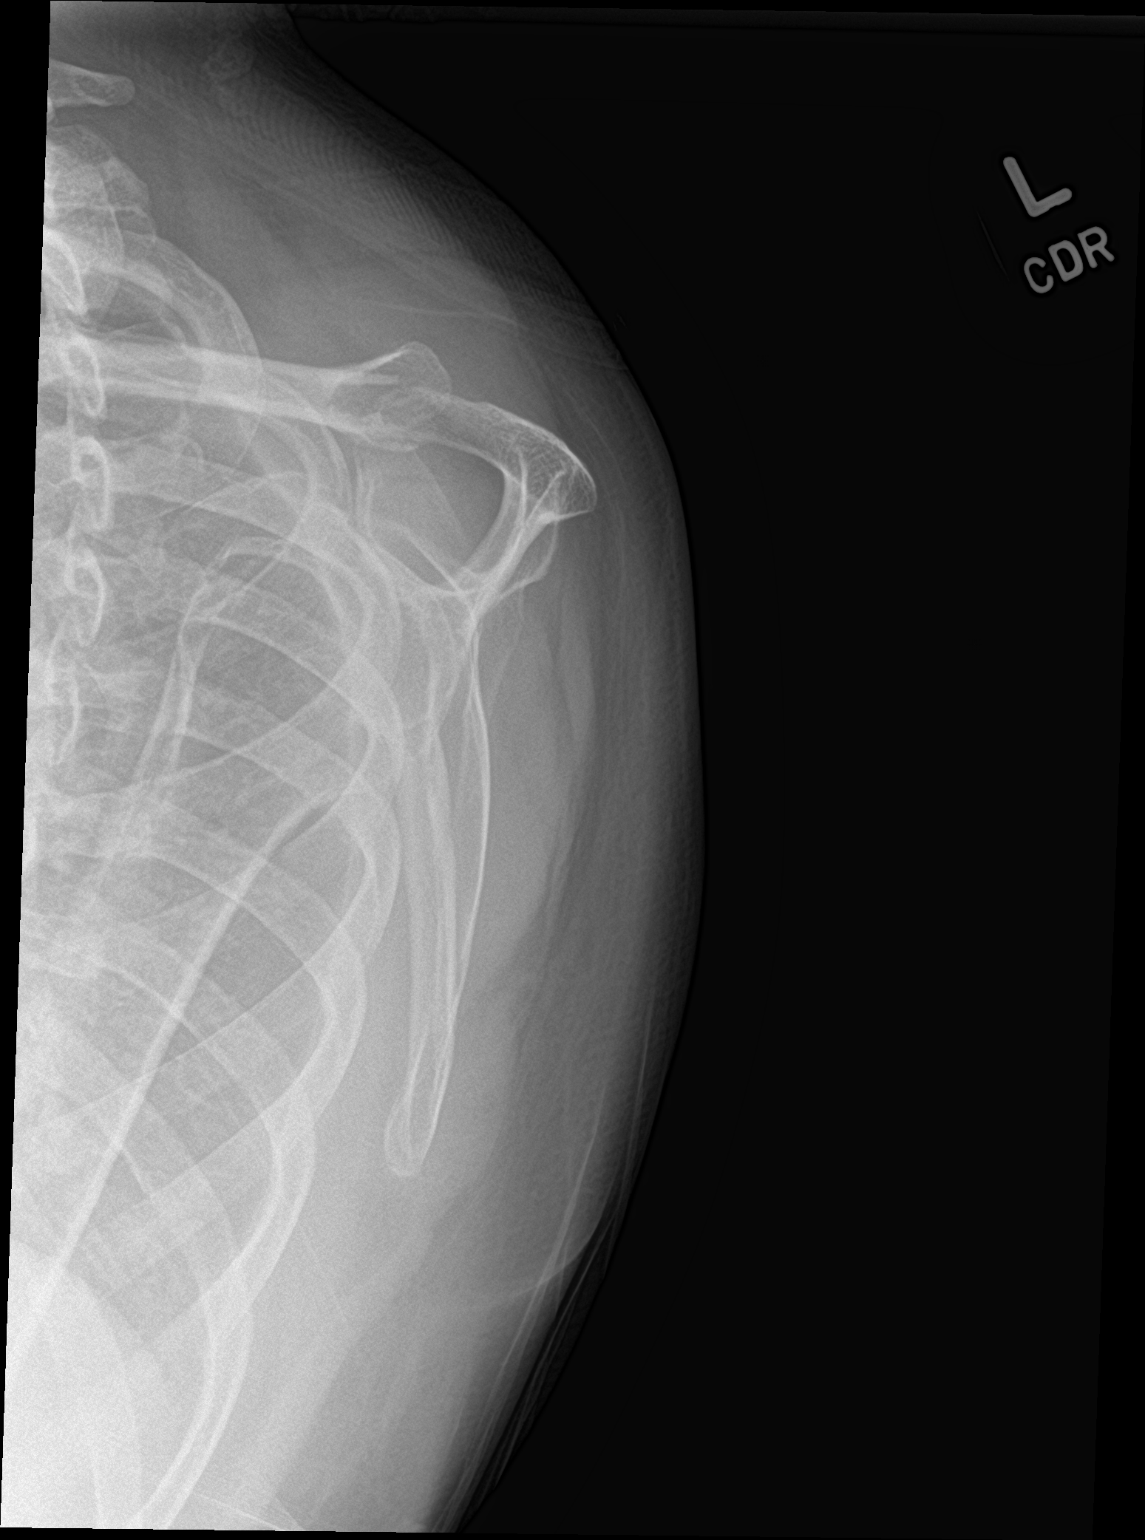

[shoulder ap neutral]
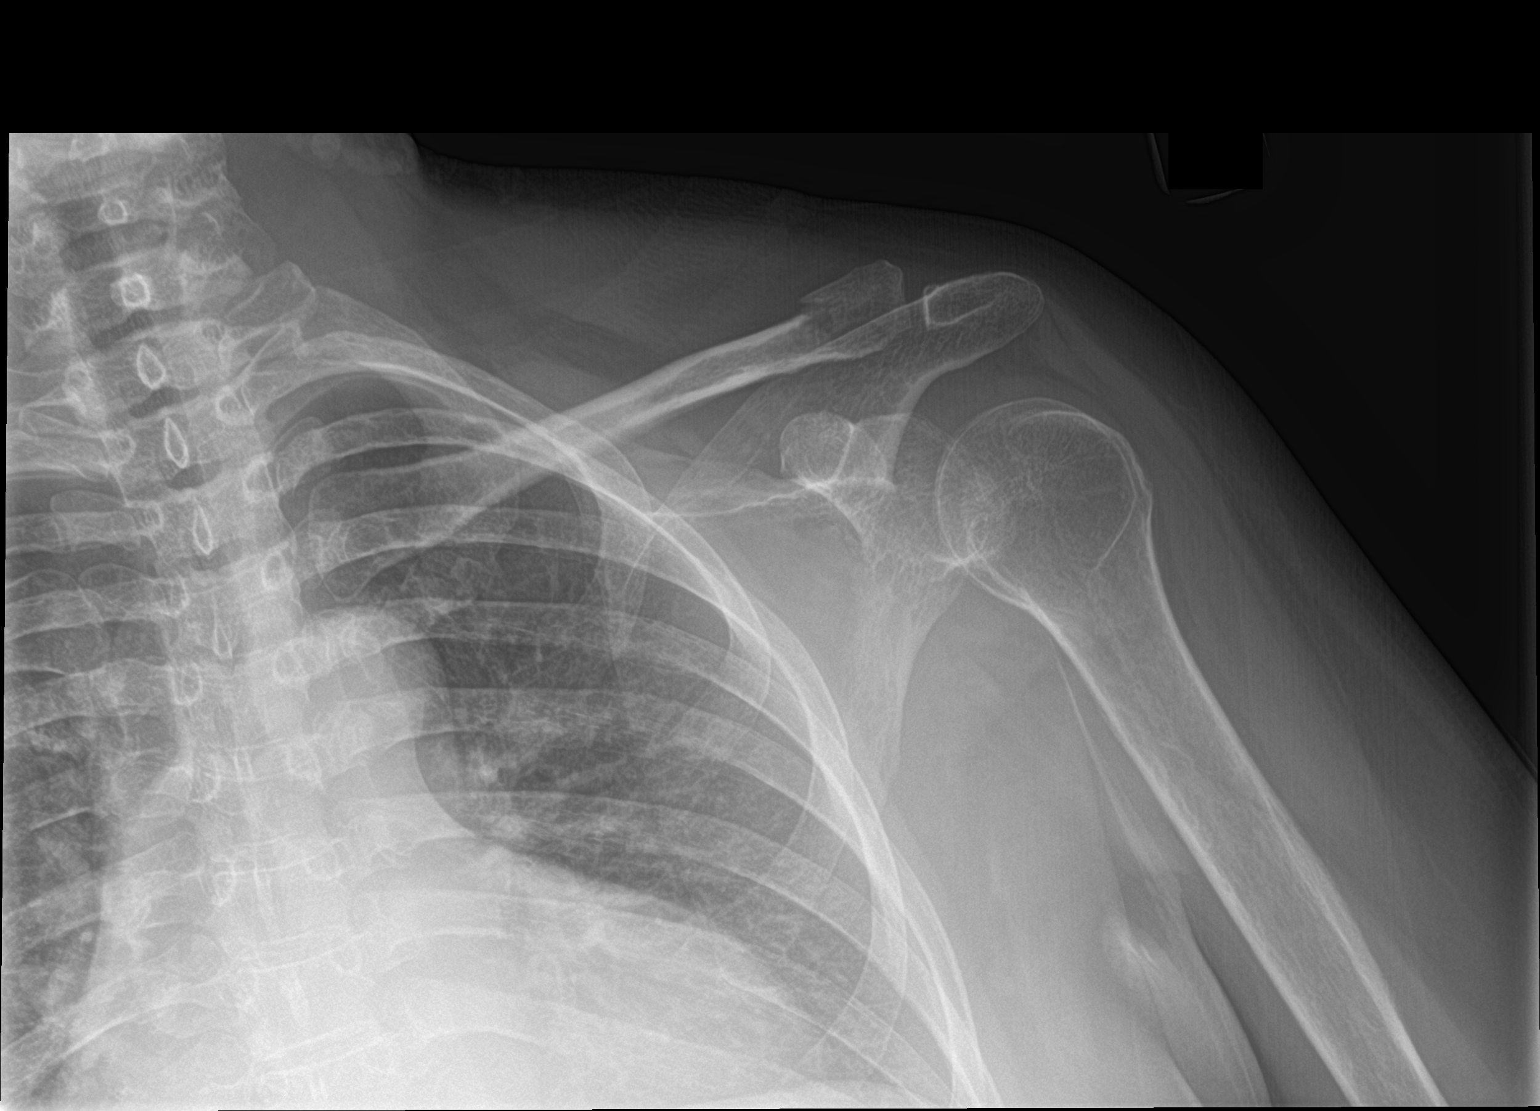

[3 of 3 positions shown; findings below may reference images not displayed]

FINDINGS: Mildly downward angulated distal left clavicular fracture is seen.
Humeral head is well seated. No other fracture is noted. Bony thorax
appears within normal limits.
IMPRESSION: Distal left clavicular fracture. No other focal abnormality is
noted.

## 2021-11-01 MED ORDER — OXYCODONE-ACETAMINOPHEN 5-325 MG PO TABS: 1.0000 | ORAL_TABLET | Freq: Four times a day (QID) | ORAL | 0 refills | Status: DC | PRN

## 2021-11-01 MED ORDER — OXYCODONE-ACETAMINOPHEN 5-325 MG PO TABS
1.0000 | ORAL_TABLET | Freq: Once | ORAL | Status: AC
  Administered 2021-11-01: 1 via ORAL
  Filled 2021-11-01: qty 1

## 2021-11-01 NOTE — ED Provider Notes (Signed)
[facility] EMERGENCY DEPARTMENT Provider Note   [name]: [id number] Arrival date & time: 10/31/21  2321     History Chief Complaint  Patient presents with   Loss of Consciousness    Abigail Campos is a 50 y.o. female.  The history is provided by the patient and medical records.  Loss of Consciousness Abigail Campos is a 50 y.o. female who presents to the Emergency Department complaining of fall, shoulder injury. She presents the emergency department for evaluation of injuries following a fall that occurred earlier today. She was walking up the steps into her house. She has two steps when she fell to her side, striking her left shoulder. She is unsure if she passed out or not. She states that she was heavy in thought due to significant stressors in her life over the last few weeks. She presents the emergency department due to ongoing pain to her left shoulder. No headache. She does have some lateral neck pain. She does have a history of prior CVA five years ago. She has persistent left-sided hemiparesis from the CVA. She lives with her husband. She is on Plavix. She also has medications for hypertension. Symptoms are moderate and constant nature.    Past Medical History:  Diagnosis Date   Anemia    Anxiety and depression    Back pain    Essential hypertension 10/19/2007   03-2010: metoprolol changed to bystolic (was not feeling well on it, no specific allergy or reaction)     Food allergy    Hemiplegia (HCC)    Hyperlipemia    Hypertension    Joint pain    Migraine    Neuromuscular disorder ([place])    left sided hemiplegia - has brace for l leg and walks with a cane   Obesity    Ovarian cancer ([place])    Prediabetes    Sleep apnea    not wearing c-pap yet   Sleep apnea    Stroke ([place]) 2017   Vitamin D deficiency     Patient Active Problem List   Diagnosis Date Noted   Nausea without vomiting 07/23/2021   Vitamin D deficiency 07/06/2021   History of CVA  (cerebrovascular accident) 07/06/2021   Other constipation 03/18/2021   Hot flashes 03/17/2021   Prediabetes 02/11/2021   COVID-19 00/93/8182   Metabolic syndrome 99/37/1696   Neuropathic pain 09/19/2020   Adjustment disorder with mixed anxiety and depressed mood 09/19/2020   Facial droop 08/30/2020   Abnormality of gait 05/09/2020   Hemiplegia of nondominant side, late effect of cerebrovascular disease () 03/28/2020   Pleural effusion 03/01/2020   Sleep disturbance 02/06/2020   Spastic hemiplegia of left nondominant side due to nontraumatic intraparenchymal hemorrhage of brain ([place]) 12/26/2019   Trigger thumb of right hand 11/02/2019   OSA (obstructive sleep apnea) 07/26/2019   Spastic hemiplegia of left nondominant side as late effect of nontraumatic intraparenchymal hemorrhage of brain ([place]) 04/06/2019   Spastic hemiparesis affecting dominant side ([place]) 01/04/2018   Hyperglycemia 12/29/2016   Depression 12/29/2016   Muscle spasticity    Class 1 obesity with serious comorbidity and body mass index (BMI) of 33.0 to 33.9 in adult 04/08/2016   Hyperlipidemia 04/08/2016   Basal ganglia hemorrhage ([place]) 04/08/2016   Dysphagia as late effect of cerebrovascular disease    Migraine with aura and without status migrainosus, not intractable    Gait disturbance, post-stroke    Hemiplegia, post-stroke (HCC)    Aphasia, post-stroke    Dysarthria,  post-stroke    ICH (intracerebral hemorrhage) (HCC) - R basal ganglia due to hypertensive emergency 04/01/2016   Upper airway cough syndrome 12/06/2015   PCP NOTES >>>>>>>>>>>>>>>>>>>>>>>>>>>.. 09/25/2015   Insomnia 12/28/2012   Annual physical exam 11/23/2011   Essential hypertension 10/19/2007    Past Surgical History:  Procedure Laterality Date   ABDOMINAL HYSTERECTOMY  11-14-07   no oophorectomy per surgical report   CESAREAN SECTION     5732,2025   INGUINAL HERNIA REPAIR     2004     OB History     Gravida  2   Para  2    Term  2   Preterm      AB      Living  2      SAB      IAB      Ectopic      Multiple      Live Births  2           Family History  Problem Relation Age of Onset   Diabetes Mother    Hypertension Mother    Glaucoma Mother    Colon cancer Mother        mother age 53   Cancer Mother    Anxiety disorder Mother    Obesity Mother    Rectal cancer Father        dx 63   Hypertension Father    Cancer Father    Sleep apnea Father    Alcoholism Father    Obesity Father    Stroke Paternal Grandfather    Stroke Maternal Grandfather    Diabetes Brother    Hypertension Brother    Heart disease Maternal Aunt        x 2 did 2012 CAD-CHF   Diabetes Brother    Hypertension Brother    Lung cancer Maternal Aunt    Breast cancer Neg Hx    Esophageal cancer Neg Hx    Stomach cancer Neg Hx     Social History   Tobacco Use   Smoking status: Never   Smokeless tobacco: Never  Vaping Use   Vaping Use: Never used  Substance Use Topics   Alcohol use: No   Drug use: No    Home Medications Prior to Admission medications   Medication Sig Start Date End Date Taking? Authorizing Provider  oxyCODONE-acetaminophen (PERCOCET/ROXICET) 5-325 MG tablet Take 1 tablet by mouth every 6 (six) hours as needed for severe pain. 11/01/21  Yes Quintella Reichert, MD  atorvastatin (LIPITOR) 20 MG tablet TAKE 1 TABLET BY MOUTH EVERY DAY 07/29/21   Colon Branch, MD  baclofen (LIORESAL) 10 MG tablet Take 3 tablets (30 mg total) by mouth 3 (three) times daily. 10/17/21   Kirsteins, Luanna Salk, MD  buPROPion (WELLBUTRIN) 100 MG tablet TAKE 1 TABLET BY MOUTH TWICE A DAY 10/13/21   Colon Branch, MD  carvedilol (COREG) 6.25 MG tablet Take 1 tablet (6.25 mg total) by mouth 2 (two) times daily with a meal. 03/11/21   Colon Branch, MD  clopidogrel (PLAVIX) 75 MG tablet TAKE 1 TABLET BY MOUTH EVERY DAY 10/23/21   Colon Branch, MD  COVID-19 mRNA bivalent vaccine, Pfizer, (PFIZER COVID-19 VAC BIVALENT) injection  Inject into the muscle. 09/30/21   Carlyle Basques, MD  escitalopram (LEXAPRO) 20 MG tablet TAKE 1 TABLET BY MOUTH EVERY DAY 08/12/21   Colon Branch, MD  gabapentin (NEURONTIN) 300 MG capsule TAKE 1 CAPSULE BY MOUTH THREE TIMES  A DAY 10/22/21   Kirsteins, Luanna Salk, MD  lisinopril (ZESTRIL) 2.5 MG tablet Take 1 tablet (2.5 mg total) by mouth daily. 05/07/21   Colon Branch, MD  tirzepatide [facility]) 2.5 MG/0.5ML Pen Inject 2.5 mg into the skin once a week. 09/25/21 12/24/21  Briscoe Deutscher, DO  tirzepatide [facility]) 5 MG/0.5ML Pen Inject 5 mg into the skin once a week. 10/29/21   Briscoe Deutscher, DO  traZODone (DESYREL) 100 MG tablet Take 1 tablet (100 mg total) by mouth at bedtime as needed for sleep. 07/09/21   Colon Branch, MD    Allergies    Bee venom, Peanut-containing drug products, Aspirin, Isovue [iopamidol], Latex, Ambien [zolpidem tartrate], and Hydrocodone  Review of Systems   Review of Systems  Cardiovascular:  Positive for syncope.  All other systems reviewed and are negative.  Physical Exam Updated Vital Signs BP (!) 156/98   Pulse 64   Temp 97.8 F (36.6 C) (Oral)   Resp 16   Ht 4\' 10"  (1.473 m)   Wt 67.1 kg   SpO2 98%   BMI 30.93 kg/m   Physical Exam Vitals and nursing note reviewed.  Constitutional:      Appearance: She is well-developed.  HENT:     Head: Normocephalic and atraumatic.  Neck:     Comments: No midline cervical spine tenderness Cardiovascular:     Rate and Rhythm: Normal rate and regular rhythm.     Heart sounds: No murmur heard. Pulmonary:     Effort: Pulmonary effort is normal. No respiratory distress.     Breath sounds: Normal breath sounds.  Abdominal:     Palpations: Abdomen is soft.     Tenderness: There is no abdominal tenderness. There is no guarding or rebound.  Musculoskeletal:     Comments: 2+ radial pulses bilaterally. Motion is at baseline in the left upper extremity per patient. She does have tenderness to palpation over the left mid  to distal clavicle and shoulder. No significant tenderness to palpation over the left upper arm, elbow, forearm. She does have mild left sided chest wall tenderness.  Skin:    General: Skin is warm and dry.  Neurological:     Mental Status: She is alert and oriented to person, place, and time.     Comments: Left hemiparesis.  Psychiatric:        Behavior: Behavior normal.    ED Results / Procedures / Treatments   Labs (all labs ordered are listed, but only abnormal results are displayed) Labs Reviewed - No data to display  EKG None  Radiology DG Chest 1 View  Result Date: 11/01/2021 CLINICAL DATA:  Recent syncopal episode with known left clavicular fracture EXAM: CHEST  1 VIEW COMPARISON:  05/20/2021 FINDINGS: Left distal clavicular fracture is again noted. Cardiac shadow is prominent but accentuated by the portable technique. No focal infiltrate is seen. No other bony abnormality is noted. IMPRESSION: Distal left clavicular fracture. No acute intrathoracic abnormality noted. Electronically Signed   By: Inez Catalina M.D.   On: 11/01/2021 01:58   DG Shoulder Left  Result Date: 11/01/2021 CLINICAL DATA:  Recent fall with left shoulder pain, initial encounter EXAM: LEFT SHOULDER - 2+ VIEW COMPARISON:  None. FINDINGS: Mildly downward angulated distal left clavicular fracture is seen. Humeral head is well seated. No other fracture is noted. Bony thorax appears within normal limits. IMPRESSION: Distal left clavicular fracture. No other focal abnormality is noted. Electronically Signed   By: Linus Mako.D.  On: 11/01/2021 01:57    Procedures Procedures   Medications Ordered in ED Medications  oxyCODONE-acetaminophen (PERCOCET/ROXICET) 5-325 MG per tablet 1 tablet (1 tablet Oral Given 11/01/21 0120)    ED Course  I have reviewed the triage vital signs and the nursing notes.  Pertinent labs & imaging results that were available during my care of the patient were reviewed by me and  considered in my medical decision making (see chart for details).    MDM Rules/Calculators/A&P                          patient here for evaluation of injuries following a fall. She is on Plavix for history of CVA and has persistent left-sided deficits. She does have tenderness to palpation over the shoulder, clavicle and chest wall. Imaging is significant for distal left clavicle fracture. Recommend a full rib series as well CT head C spine given her anticoagulation and possible head injury. Also discussed recommendation for labs EKG due to possible syncopal event. Patient declines labs or advanced imaging at this time.  Discussed home care for clavicle fracture with sling for mobilization. PRN OTC pain medications with Percocet if her pain is uncontrolled. Discussed orthopedics follow-up and return precautions.  Final Clinical Impression(s) / ED Diagnoses Final diagnoses:  Fall  Closed displaced fracture of acromial end of left clavicle, initial encounter    Rx / DC Orders ED Discharge Orders          Ordered    oxyCODONE-acetaminophen (PERCOCET/ROXICET) 5-325 MG tablet  Every 6 hours PRN        11/01/21 0218             Quintella Reichert, MD 11/01/21 0222

## 2021-11-03 ENCOUNTER — Encounter: Payer: Self-pay | Admitting: Physical Therapy

## 2021-11-03 ENCOUNTER — Ambulatory Visit: Payer: No Typology Code available for payment source | Admitting: Physical Therapy

## 2021-11-03 ENCOUNTER — Other Ambulatory Visit: Payer: Self-pay

## 2021-11-03 DIAGNOSIS — I69352 Hemiplegia and hemiparesis following cerebral infarction affecting left dominant side: Secondary | ICD-10-CM | POA: Diagnosis not present

## 2021-11-03 DIAGNOSIS — I69359 Hemiplegia and hemiparesis following cerebral infarction affecting unspecified side: Secondary | ICD-10-CM | POA: Diagnosis not present

## 2021-11-03 DIAGNOSIS — Z9181 History of falling: Secondary | ICD-10-CM

## 2021-11-03 DIAGNOSIS — M6281 Muscle weakness (generalized): Secondary | ICD-10-CM | POA: Diagnosis not present

## 2021-11-03 DIAGNOSIS — R269 Unspecified abnormalities of gait and mobility: Secondary | ICD-10-CM | POA: Diagnosis not present

## 2021-11-03 DIAGNOSIS — R29818 Other symptoms and signs involving the nervous system: Secondary | ICD-10-CM | POA: Diagnosis not present

## 2021-11-03 NOTE — Therapy (Signed)
[facility] [place], [place], [address] Phone: [phone]   Fax:  [phone]  Physical Therapy Treatment  Patient Details  Name: Abigail Campos MRN: [id number] Date of Birth: April 04, 1971 Referring Provider (PT): Dr Letta Pate   Encounter Date: 11/03/2021   PT End of Session - 11/03/21 1440     Visit Number 2    Number of Visits 17    Date for PT Re-Evaluation 12/24/21    PT Start Time 1105    PT Stop Time 1147    PT Time Calculation (min) 42 min    Equipment Utilized During Treatment Gait belt    Activity Tolerance Patient tolerated treatment well    Behavior During Therapy [facility] for tasks assessed/performed             Past Medical History:  Diagnosis Date   Anemia    Anxiety and depression    Back pain    Essential hypertension 10/19/2007   03-2010: metoprolol changed to bystolic (was not feeling well on it, no specific allergy or reaction)     Food allergy    Hemiplegia ([place])    Hyperlipemia    Hypertension    Joint pain    Migraine    Neuromuscular disorder ([place])    left sided hemiplegia - has brace for l leg and walks with a cane   Obesity    Ovarian cancer ([place])    Prediabetes    Sleep apnea    not wearing c-pap yet   Sleep apnea    Stroke ([place]) 2017   Vitamin D deficiency     Past Surgical History:  Procedure Laterality Date   ABDOMINAL HYSTERECTOMY  11-14-07   no oophorectomy per surgical report   CESAREAN SECTION     [id number]   INGUINAL HERNIA REPAIR     2004    There were no vitals filed for this visit.   Subjective Assessment - 11/03/21 1111     Subjective Patient arrives wearing a sling on LUE. She reports that she broke her L collarbone.    Pertinent History PMH: HTN, hx of CVA (2017), ovarian cancer)    Limitations Walking;Standing    Patient Stated Goals Stand longer and better, [place] wihtout a cane, but wearing brace.    Currently in Pain? Yes    Pain Score 6      Pain Location Shoulder    Pain Orientation Left    Pain Type Acute pain    Pain Onset In the past 7 days    Pain Frequency Constant    Aggravating Factors  Movement    Pain Relieving Factors rest    Effect of Pain on Daily Activities limits all movement.    Multiple Pain Sites No                               [facility] Adult PT Treatment/Exercise - 11/03/21 0001       Exercises   Exercises Knee/Hip      Knee/Hip Exercises: Aerobic   Nustep L5 x 6 minutes, then 2 x 30 seconds at L7 wit hemphasis on L LE push to engage muscles.      Knee/Hip Exercises: Standing   Other Standing Knee Exercises Standing weight shfits with feet paralell and in stride position, LLE in front. Progressed to stepping forward and back with RLE, therapsit facilitating weight shift and step through to  increase WB and control in LLE. Stood sideways to mat with R side next to mat, RUE support.      Knee/Hip Exercises: Seated   Sit to [place] 1 set;with UE support;Other (comment)   Patient sat on mat, facing therapist on chair with armrests. Therapsit facilitated forward weight shift and sit to stand from the mat, and facilitated weight shift onto LLE with knee control.                    PT Education - 11/03/21 1440     Education Details POC to increase LLE WB and control    Person(s) Educated Patient    Methods Explanation    Comprehension Verbalized understanding              PT Short Term Goals - 11/03/21 1442       PT SHORT TERM GOAL #1   Title Patient will be I with basic HEP    Baseline TBD    Time 3    Period Weeks    Status On-going    Target Date 11/26/21               PT Long Term Goals - 10/29/21 1432       PT LONG TERM GOAL #1   Title Patient to be independent with final HEP for improved functional mobility, decreased fall risk.    Time 8    Period Weeks    Status New    Target Date 12/24/21      PT LONG TERM GOAL #2   Title Decrease time  for 5 x sit to stand test to < 20 seconds.    Baseline 23.87    Time 8    Period Weeks    Status New    Target Date 12/24/21      PT LONG TERM GOAL #3   Title Pt will decr TUG time to 20 seconds or less with SPC with quad tip in order to demo decr fall risk.    Baseline 24.70    Time 8    Period Weeks    Status New    Target Date 12/24/21      PT LONG TERM GOAL #4   Title Pt will ambulate at least 300' over indoor level and outdoor unlevel surfaces with LRAD with supervision in order to demo improved community mobility.    Baseline 100' with cane on level surfaces iwth multiple gait deviations.    Time 8    Period Weeks    Status New    Target Date 12/24/21                   Plan - 11/03/21 1441     Clinical Impression Statement Patient fell over the weekend trying to climb steps, broke her L collarbone. She plans to contact [name] at [place], since he is familiar with her situation and may be able to modify her brace.    Personal Factors and Comorbidities Comorbidity 3+;Time since onset of injury/illness/exacerbation;Fitness;Past/Current Experience    Comorbidities HTN, hx of CVA (2017), ovarian cancer    Examination-Activity Limitations Locomotion Level;Stand;Transfers;Caring for Others    Examination-Participation Restrictions Community Activity    Stability/Clinical Decision Making Stable/Uncomplicated    Clinical Decision Making Low    Rehab Potential Good    PT Frequency 2x / week    PT Duration Other (comment)   7w   PT Treatment/Interventions ADLs/Self Care Home Management;Aquatic Therapy;DME Instruction;Gait training;Functional  mobility training;Therapeutic activities;Stair training;Therapeutic exercise;Balance training;Neuromuscular re-education;Orthotic Fit/Training;Patient/family education;Passive range of motion;Dry needling;Manual techniques;Iontophoresis 4mg /ml Dexamethasone;Electrical Stimulation    PT Next Visit Plan Progress with strengthening, balance  training. Establish HEP to support gains. Contact Chris at [employer].    Consulted and Agree with Plan of Care Patient             Patient will benefit from skilled therapeutic intervention in order to improve the following deficits and impairments:  Abnormal gait, Decreased coordination, Decreased range of motion, Difficulty walking, Increased fascial restricitons, Impaired tone, Decreased endurance, Increased muscle spasms, Decreased activity tolerance, Pain, Decreased balance, Impaired flexibility, Improper body mechanics, Decreased mobility, Decreased strength, Impaired sensation, Postural dysfunction  Visit Diagnosis: Muscle weakness (generalized)  Other symptoms and signs involving the nervous system  Abnormality of gait  Hemiplegia and hemiparesis following cerebral infarction affecting left dominant side (HCC)  Hemiplegia, post-stroke (HCC)  History of falling     Problem List Patient Active Problem List   Diagnosis Date Noted   Nausea without vomiting 07/23/2021   Vitamin D deficiency 07/06/2021   History of CVA (cerebrovascular accident) 07/06/2021   Other constipation 03/18/2021   Hot flashes 03/17/2021   Prediabetes 02/11/2021   COVID-19 55/97/4163   Metabolic syndrome 84/53/6468   Neuropathic pain 09/19/2020   Adjustment disorder with mixed anxiety and depressed mood 09/19/2020   Facial droop 08/30/2020   Abnormality of gait 05/09/2020   Hemiplegia of nondominant side, late effect of cerebrovascular disease (HCC) 03/28/2020   Pleural effusion 03/01/2020   Sleep disturbance 02/06/2020   Spastic hemiplegia of left nondominant side due to nontraumatic intraparenchymal hemorrhage of brain ([place]) 12/26/2019   Trigger thumb of right hand 11/02/2019   OSA (obstructive sleep apnea) 07/26/2019   Spastic hemiplegia of left nondominant side as late effect of nontraumatic intraparenchymal hemorrhage of brain (HCC) 04/06/2019   Spastic hemiparesis affecting dominant side  (HCC) 01/04/2018   Hyperglycemia 12/29/2016   Depression 12/29/2016   Muscle spasticity    Class 1 obesity with serious comorbidity and body mass index (BMI) of 33.0 to 33.9 in adult 04/08/2016   Hyperlipidemia 04/08/2016   Basal ganglia hemorrhage ([place]) 04/08/2016   Dysphagia as late effect of cerebrovascular disease    Migraine with aura and without status migrainosus, not intractable    Gait disturbance, post-stroke    Hemiplegia, post-stroke (HCC)    Aphasia, post-stroke    Dysarthria, post-stroke    ICH (intracerebral hemorrhage) ([place]) - R basal ganglia due to hypertensive emergency 04/01/2016   Upper airway cough syndrome 12/06/2015   PCP NOTES >>>>>>>>>>>>>>>>>>>>>>>>>>>.. 09/25/2015   Insomnia 12/28/2012   Annual physical exam 11/23/2011   Essential hypertension 10/19/2007    Marcelina Morel, DPT 11/03/2021, 2:44 PM  [place]. [place], [place], [address] Phone: [phone]   Fax:  [phone]  Name: ELLIN FITZGIBBONS MRN: [id number] Date of Birth: 13-Oct-1971

## 2021-11-04 ENCOUNTER — Encounter: Payer: Self-pay | Admitting: Internal Medicine

## 2021-11-04 ENCOUNTER — Ambulatory Visit: Payer: No Typology Code available for payment source | Admitting: Physical Therapy

## 2021-11-04 ENCOUNTER — Encounter: Payer: Self-pay | Admitting: Physical Therapy

## 2021-11-04 DIAGNOSIS — M6281 Muscle weakness (generalized): Secondary | ICD-10-CM | POA: Diagnosis not present

## 2021-11-04 DIAGNOSIS — R269 Unspecified abnormalities of gait and mobility: Secondary | ICD-10-CM | POA: Diagnosis not present

## 2021-11-04 DIAGNOSIS — I69352 Hemiplegia and hemiparesis following cerebral infarction affecting left dominant side: Secondary | ICD-10-CM | POA: Diagnosis not present

## 2021-11-04 DIAGNOSIS — Z9181 History of falling: Secondary | ICD-10-CM | POA: Diagnosis not present

## 2021-11-04 DIAGNOSIS — R29818 Other symptoms and signs involving the nervous system: Secondary | ICD-10-CM

## 2021-11-04 DIAGNOSIS — I69359 Hemiplegia and hemiparesis following cerebral infarction affecting unspecified side: Secondary | ICD-10-CM | POA: Diagnosis not present

## 2021-11-04 NOTE — Therapy (Signed)
[facility] [address] [place], [place], [address] Phone: [phone]   Fax:  [phone]  Physical Therapy Treatment  Patient Details  Name: Abigail Campos MRN: [id number] Date of Birth: 1971-09-12 Referring Provider (PT): Dr Abigail Campos   Encounter Date: 11/04/2021   PT End of Session - 11/04/21 1216     Visit Number 3    Number of Visits 17    Date for PT Re-Evaluation 12/24/21    Authorization Type [employer]    Progress Note Due on Visit 10    PT Start Time 1104    PT Stop Time 1145    PT Time Calculation (min) 41 min    Equipment Utilized During Treatment Gait belt    Activity Tolerance Patient tolerated treatment well    Behavior During Therapy [facility] for tasks assessed/performed             Past Medical History:  Diagnosis Date   Anemia    Anxiety and depression    Back pain    Essential hypertension 10/19/2007   03-2010: metoprolol changed to bystolic (was not feeling well on it, no specific allergy or reaction)     Food allergy    Hemiplegia ([place])    Hyperlipemia    Hypertension    Joint pain    Migraine    Neuromuscular disorder ([place])    left sided hemiplegia - has brace for l leg and walks with a cane   Obesity    Ovarian cancer ([place])    Prediabetes    Sleep apnea    not wearing c-pap yet   Sleep apnea    Stroke ([place]) 2017   Vitamin D deficiency     Past Surgical History:  Procedure Laterality Date   ABDOMINAL HYSTERECTOMY  11-14-07   no oophorectomy per surgical report   CESAREAN SECTION     [id number]   INGUINAL HERNIA REPAIR     2004    There were no vitals filed for this visit.   Subjective Assessment - 11/04/21 1211     Subjective Doing OK, still having a lot of pain where I broke my collarbone. Really want to keep working on weightshifting over and strengthening L leg.    Pertinent History PMH: HTN, hx of CVA (2017), ovarian cancer)    Limitations Walking;Standing     How long can you sit comfortably? Sore buttocks after fall.    How long can you stand comfortably? up to 35 minutes.    How long can you walk comfortably? Walks for exercise, tries to get 3000 steps per day.    Patient Stated Goals Stand longer and better, [facility] wihtout a cane, but wearing brace.    Currently in Pain? Yes    Pain Score 6     Pain Location Shoulder    Pain Orientation Left    Pain Descriptors / Indicators Aching;Sore    Pain Type Acute pain                               [facility] Adult PT Treatment/Exercise - 11/04/21 0001       Knee/Hip Exercises: Aerobic   Nustep L5 x4 minutes BLEs only   limited by time constraints of session     Knee/Hip Exercises: Standing   Gait Training after multiple exercises for weight shifting/LE proprioception and control L LE- still note some mild knee buckling in stance  phase/poor proprioception    Other Standing Knee Exercises sit to stands 1x10 with R LE on 4 inch box for improved muscle activation/WB L LE with transfers; forward stepping R LE while maintaining stance phase L LE without knee buckling/hyperextension; side stepping with R LE while maintaining stance phase L LE without knee buckling/hyperextension; attempted marches with R LE/stance L LE but unable to tolerate due to pain                     PT Education - 11/04/21 1216     Education Details exercise form and corrections    Person(s) Educated Patient    Methods Explanation    Comprehension Verbalized understanding;Need further instruction              PT Short Term Goals - 11/03/21 1442       PT SHORT TERM GOAL #1   Title Patient will be I with basic HEP    Baseline TBD    Time 3    Period Weeks    Status On-going    Target Date 11/26/21               PT Long Term Goals - 10/29/21 1432       PT LONG TERM GOAL #1   Title Patient to be independent with final HEP for improved functional mobility, decreased fall risk.     Time 8    Period Weeks    Status New    Target Date 12/24/21      PT LONG TERM GOAL #2   Title Decrease time for 5 x sit to stand test to < 20 seconds.    Baseline 23.87    Time 8    Period Weeks    Status New    Target Date 12/24/21      PT LONG TERM GOAL #3   Title Pt will decr TUG time to 20 seconds or less with SPC with quad tip in order to demo decr fall risk.    Baseline 24.70    Time 8    Period Weeks    Status New    Target Date 12/24/21      PT LONG TERM GOAL #4   Title Pt will ambulate at least 300' over indoor level and outdoor unlevel surfaces with LRAD with supervision in order to demo improved community mobility.    Baseline 100' with cane on level surfaces iwth multiple gait deviations.    Time 8    Period Weeks    Status New    Target Date 12/24/21                   Plan - 11/04/21 1216     Clinical Impression Statement Abigail Campos arrives today feeling OK, still having some pain from her new clavicle fracture; her sling was not in an ideal position and she did not have waist strap for the sling, so I did adjust the sling using TB as a makeshift strap with much better UE support and comfort noted. Spent a good part of session working on weight bearing/weight shifting over L LE with multimodal support and cues provided by therapist to prevent knee buckling and hyperextension today. Also needed heavy multimodal cues for getting and maintaining weight over L LE with stand to sit/sit to stand transfers. Does have reduced sensation and impaired proprioception LLE as a whole. Finished on Nustep with manual facilitation to keep good alignment LLE, which  fatigues easily. Will continue to progress as able and tolerated.    Personal Factors and Comorbidities Comorbidity 3+;Time since onset of injury/illness/exacerbation;Fitness;Past/Current Experience    Comorbidities HTN, hx of CVA (2017), ovarian cancer    Examination-Activity Limitations Locomotion  Level;Stand;Transfers;Caring for Others    Examination-Participation Restrictions Community Activity    Stability/Clinical Decision Making Stable/Uncomplicated    Clinical Decision Making Low    Rehab Potential Good    PT Frequency 2x / week    PT Duration Other (comment)   7 weeks   PT Treatment/Interventions ADLs/Self Care Home Management;Aquatic Therapy;DME Instruction;Gait training;Functional mobility training;Therapeutic activities;Stair training;Therapeutic exercise;Balance training;Neuromuscular re-education;Orthotic Fit/Training;Patient/family education;Passive range of motion;Dry needling;Manual techniques;Iontophoresis 4mg /ml Dexamethasone;Electrical Stimulation    PT Next Visit Plan Progress with strengthening, balance training. Establish HEP to support gains. Contact Chris at [employer].    PT Home Exercise Plan Access Code: [email or web address]    Consulted and Agree with Plan of Care Patient             Patient will benefit from skilled therapeutic intervention in order to improve the following deficits and impairments:  Abnormal gait, Decreased coordination, Decreased range of motion, Difficulty walking, Increased fascial restricitons, Impaired tone, Decreased endurance, Increased muscle spasms, Decreased activity tolerance, Pain, Decreased balance, Impaired flexibility, Improper body mechanics, Decreased mobility, Decreased strength, Impaired sensation, Postural dysfunction  Visit Diagnosis: Muscle weakness (generalized)  Other symptoms and signs involving the nervous system  Abnormality of gait     Problem List Patient Active Problem List   Diagnosis Date Noted   Nausea without vomiting 07/23/2021   Vitamin D deficiency 07/06/2021   History of CVA (cerebrovascular accident) 07/06/2021   Other constipation 03/18/2021   Hot flashes 03/17/2021   Prediabetes 02/11/2021   COVID-19 46/56/8127   Metabolic syndrome 51/70/0174   Neuropathic pain 09/19/2020   Adjustment disorder with  mixed anxiety and depressed mood 09/19/2020   Facial droop 08/30/2020   Abnormality of gait 05/09/2020   Hemiplegia of nondominant side, late effect of cerebrovascular disease ([place]) 03/28/2020   Pleural effusion 03/01/2020   Sleep disturbance 02/06/2020   Spastic hemiplegia of left nondominant side due to nontraumatic intraparenchymal hemorrhage of brain ([place]) 12/26/2019   Trigger thumb of right hand 11/02/2019   OSA (obstructive sleep apnea) 07/26/2019   Spastic hemiplegia of left nondominant side as late effect of nontraumatic intraparenchymal hemorrhage of brain (HCC) 04/06/2019   Spastic hemiparesis affecting dominant side ([place]) 01/04/2018   Hyperglycemia 12/29/2016   Depression 12/29/2016   Muscle spasticity    Class 1 obesity with serious comorbidity and body mass index (BMI) of 33.0 to 33.9 in adult 04/08/2016   Hyperlipidemia 04/08/2016   Basal ganglia hemorrhage ([place]) 04/08/2016   Dysphagia as late effect of cerebrovascular disease    Migraine with aura and without status migrainosus, not intractable    Gait disturbance, post-stroke    Hemiplegia, post-stroke (HCC)    Aphasia, post-stroke    Dysarthria, post-stroke    ICH (intracerebral hemorrhage) ([place]) - R basal ganglia due to hypertensive emergency 04/01/2016   Upper airway cough syndrome 12/06/2015   PCP NOTES >>>>>>>>>>>>>>>>>>>>>>>>>>>.. 09/25/2015   Insomnia 12/28/2012   Annual physical exam 11/23/2011   Essential hypertension 10/19/2007   Ann Lions PT, DPT, PN2   Supplemental Physical Therapist [facility]    Pager [phone] Acute Rehab Office [address]. [place], [place], [address] Phone: [phone]   Fax:  [phone]  Name: Abigail Campos [facility]  MRN: [id number] Date of Birth: 10-29-1971

## 2021-11-05 ENCOUNTER — Other Ambulatory Visit: Payer: Self-pay | Admitting: Internal Medicine

## 2021-11-05 MED ORDER — OXYCODONE-ACETAMINOPHEN 5-325 MG PO TABS
1.0000 | ORAL_TABLET | Freq: Three times a day (TID) | ORAL | 0 refills | Status: DC | PRN
Start: 2021-11-05 — End: 2021-12-02

## 2021-11-11 ENCOUNTER — Ambulatory Visit: Payer: No Typology Code available for payment source | Admitting: Physical Therapy

## 2021-11-12 ENCOUNTER — Encounter: Payer: Self-pay | Admitting: Internal Medicine

## 2021-11-13 ENCOUNTER — Ambulatory Visit (INDEPENDENT_AMBULATORY_CARE_PROVIDER_SITE_OTHER): Payer: No Typology Code available for payment source | Admitting: Family Medicine

## 2021-11-13 ENCOUNTER — Ambulatory Visit: Payer: No Typology Code available for payment source | Admitting: Physical Therapy

## 2021-11-13 VITALS — BP 130/80 | HR 94 | Temp 99.0°F | Resp 16 | Ht <= 58 in | Wt 149.0 lb

## 2021-11-13 DIAGNOSIS — Z20828 Contact with and (suspected) exposure to other viral communicable diseases: Secondary | ICD-10-CM

## 2021-11-13 LAB — POCT INFLUENZA A/B
Influenza A, POC: NEGATIVE
Influenza B, POC: NEGATIVE

## 2021-11-13 NOTE — Progress Notes (Signed)
[facility] [place], [place], [place] [address] [phone]  Date:  11/13/2021   Name:  Abigail Campos   DOB:  December 22, 1970   MRN:  [id number]  PCP:  Colon Branch, MD    Chief Complaint: flu exposure (flu exposure-several family members, stuffy nose, cough)   History of Present Illness:  Abigail Campos is a 50 y.o. very pleasant female patient who presents with the following: History of upper air way cough, stroke with hemiplegia, HTN Pt seen today with concern of possible flu/concerned about exposure Many people in her family had the flu- they visited family for Thanksgiving and were exposed to several people to turn out to have the flu She just had her flu shot and covid booster recently She is actually feeling ok -she wants to visit her daughter who is pregnant in a few days and would like to be sure she is not harboring flu   Patient Active Problem List   Diagnosis Date Noted   Nausea without vomiting 07/23/2021   Vitamin D deficiency 07/06/2021   History of CVA (cerebrovascular accident) 07/06/2021   Other constipation 03/18/2021   Hot flashes 03/17/2021   Prediabetes 02/11/2021   COVID-19 67/11/4579   Metabolic syndrome 99/83/3825   Neuropathic pain 09/19/2020   Adjustment disorder with mixed anxiety and depressed mood 09/19/2020   Facial droop 08/30/2020   Abnormality of gait 05/09/2020   Hemiplegia of nondominant side, late effect of cerebrovascular disease ([place]) 03/28/2020   Pleural effusion 03/01/2020   Sleep disturbance 02/06/2020   Spastic hemiplegia of left nondominant side due to nontraumatic intraparenchymal hemorrhage of brain ([place]) 12/26/2019   Trigger thumb of right hand 11/02/2019   OSA (obstructive sleep apnea) 07/26/2019   Spastic hemiplegia of left nondominant side as late effect of nontraumatic intraparenchymal hemorrhage of brain ([place]) 04/06/2019   Spastic hemiparesis  affecting dominant side ([place]) 01/04/2018   Hyperglycemia 12/29/2016   Depression 12/29/2016   Muscle spasticity    Class 1 obesity with serious comorbidity and body mass index (BMI) of 33.0 to 33.9 in adult 04/08/2016   Hyperlipidemia 04/08/2016   Basal ganglia hemorrhage ([place]) 04/08/2016   Dysphagia as late effect of cerebrovascular disease    Migraine with aura and without status migrainosus, not intractable    Gait disturbance, post-stroke    Hemiplegia, post-stroke (HCC)    Aphasia, post-stroke    Dysarthria, post-stroke    ICH (intracerebral hemorrhage) ([place]) - R basal ganglia due to hypertensive emergency 04/01/2016   Upper airway cough syndrome 12/06/2015   PCP NOTES >>>>>>>>>>>>>>>>>>>>>>>>>>>.. 09/25/2015   Insomnia 12/28/2012   Annual physical exam 11/23/2011   Essential hypertension 10/19/2007    Past Medical History:  Diagnosis Date   Anemia    Anxiety and depression    Back pain    Essential hypertension 10/19/2007   03-2010: metoprolol changed to bystolic (was not feeling well on it, no specific allergy or reaction)     Food allergy    Hemiplegia (HCC)    Hyperlipemia    Hypertension    Joint pain    Migraine    Neuromuscular disorder ([place])    left sided hemiplegia - has brace for l leg and walks with a cane   Obesity    Ovarian cancer ([place])    Prediabetes    Sleep apnea    not wearing c-pap yet   Sleep apnea    Stroke ([place])  2017   Vitamin D deficiency     Past Surgical History:  Procedure Laterality Date   ABDOMINAL HYSTERECTOMY  11-14-07   no oophorectomy per surgical report   CESAREAN SECTION     [id number]   INGUINAL HERNIA REPAIR     2004    Social History   Tobacco Use   Smoking status: Never   Smokeless tobacco: Never  Vaping Use   Vaping Use: Never used  Substance Use Topics   Alcohol use: No   Drug use: No    Family History  Problem Relation Age of Onset   Diabetes Mother    Hypertension Mother    Glaucoma Mother    Colon  cancer Mother        mother age 60   Cancer Mother    Anxiety disorder Mother    Obesity Mother    Rectal cancer Father        dx 62   Hypertension Father    Cancer Father    Sleep apnea Father    Alcoholism Father    Obesity Father    Stroke Paternal Grandfather    Stroke Maternal Grandfather    Diabetes Brother    Hypertension Brother    Heart disease Maternal Aunt        x 2 did 2012 CAD-CHF   Diabetes Brother    Hypertension Brother    Lung cancer Maternal Aunt    Breast cancer Neg Hx    Esophageal cancer Neg Hx    Stomach cancer Neg Hx     Allergies  Allergen Reactions   Bee Venom Anaphylaxis   Peanut-Containing Drug Products Anaphylaxis   Aspirin Other (See Comments)    Reports blisters w/ ASA but pt reports is ok w/ Motrin, Advil, naproxen   Isovue [Iopamidol] Hives and Itching    Pt broke out with one facial hive.  Had itching on her right upper ant and posterior arm w/o evidence of hives.  Pt given water and 25mg  benadryl po.  We observed pt for 30 minutes before d/c.  J. Bohm     Latex Hives   Ambien [Zolpidem Tartrate] Other (See Comments)    forgetfulness   Hydrocodone Itching    Generalized itching w/o rash    Medication list has been reviewed and updated.  Current Outpatient Medications on File Prior to Visit  Medication Sig Dispense Refill   atorvastatin (LIPITOR) 20 MG tablet TAKE 1 TABLET BY MOUTH EVERY DAY 90 tablet 1   baclofen (LIORESAL) 10 MG tablet Take 3 tablets (30 mg total) by mouth 3 (three) times daily. 270 tablet 0   buPROPion (WELLBUTRIN) 100 MG tablet TAKE 1 TABLET BY MOUTH TWICE A DAY 180 tablet 1   carvedilol (COREG) 6.25 MG tablet Take 1 tablet (6.25 mg total) by mouth 2 (two) times daily with a meal. 180 tablet 1   clopidogrel (PLAVIX) 75 MG tablet TAKE 1 TABLET BY MOUTH EVERY DAY 90 tablet 1   COVID-19 mRNA bivalent vaccine, Pfizer, (PFIZER COVID-19 VAC BIVALENT) injection Inject into the muscle. 0.3 mL 0   escitalopram (LEXAPRO)  20 MG tablet TAKE 1 TABLET BY MOUTH EVERY DAY 90 tablet 1   gabapentin (NEURONTIN) 300 MG capsule TAKE 1 CAPSULE BY MOUTH THREE TIMES A DAY 90 capsule 1   lisinopril (ZESTRIL) 2.5 MG tablet Take 1 tablet (2.5 mg total) by mouth daily. 90 tablet 1   oxyCODONE-acetaminophen (PERCOCET/ROXICET) 5-325 MG tablet Take 1 tablet by mouth every 8 (  eight) hours as needed for severe pain. 15 tablet 0   tirzepatide (MOUNJARO) 2.5 MG/0.5ML Pen Inject 2.5 mg into the skin once a week. 6 mL 0   tirzepatide (MOUNJARO) 5 MG/0.5ML Pen Inject 5 mg into the skin once a week. 6 mL 0   traZODone (DESYREL) 100 MG tablet Take 1 tablet (100 mg total) by mouth at bedtime as needed for sleep. 90 tablet 1   No current facility-administered medications on file prior to visit.    Review of Systems:  As per HPI- otherwise negative.   Physical Examination: Vitals:   11/13/21 1503  BP: 130/80  Pulse: 94  Resp: 16  Temp: 99 F (37.2 C)  SpO2: 100%   Vitals:   11/13/21 1503  Weight: 149 lb (67.6 kg)  Height: 4\' 10"  (1.473 m)   Body mass index is 31.14 kg/m. Ideal Body Weight: Weight in (lb) to have BMI = 25: 119.4  GEN: no acute distress. HEENT: Atraumatic, Normocephalic.   Bilateral TM wnl, oropharynx normal.  PEERL,EOMI.   Ears and Nose: No external deformity. CV: RRR, No M/G/R. No JVD. No thrill. No extra heart sounds. PULM: CTA B, no wheezes, crackles, rhonchi. No retractions. No resp. distress. No accessory muscle use. EXTR: No c/c/e PSYCH: Normally interactive. Conversant.  Patient uses cane for history of hemiplegia.  She is also wearing a left arm sling due to clavicle fracture.  Mildly obese, otherwise looks well  Results for orders placed or performed in visit on 11/13/21  POCT Influenza A/B  Result Value Ref Range   Influenza A, POC Negative Negative   Influenza B, POC Negative Negative   *Note: Due to a large number of results and/or encounters for the requested time period, some results  have not been displayed. A complete set of results can be found in Results Review.    Assessment and Plan: Exposure to the flu - Plan: POCT Influenza A/B  Patient seen today for concern of flu exposure.  Her rapid flu is negative Advised that unless she develops symptoms she does not seem to have the flu Signed Lamar Blinks, MD

## 2021-11-17 ENCOUNTER — Encounter: Payer: Self-pay | Admitting: Physical Therapy

## 2021-11-17 ENCOUNTER — Ambulatory Visit
Payer: No Typology Code available for payment source | Attending: Physical Medicine & Rehabilitation | Admitting: Physical Therapy

## 2021-11-17 ENCOUNTER — Other Ambulatory Visit: Payer: Self-pay

## 2021-11-17 DIAGNOSIS — Z9181 History of falling: Secondary | ICD-10-CM

## 2021-11-17 DIAGNOSIS — R29818 Other symptoms and signs involving the nervous system: Secondary | ICD-10-CM

## 2021-11-17 DIAGNOSIS — R269 Unspecified abnormalities of gait and mobility: Secondary | ICD-10-CM

## 2021-11-17 DIAGNOSIS — R2689 Other abnormalities of gait and mobility: Secondary | ICD-10-CM

## 2021-11-17 DIAGNOSIS — I69359 Hemiplegia and hemiparesis following cerebral infarction affecting unspecified side: Secondary | ICD-10-CM | POA: Diagnosis not present

## 2021-11-17 DIAGNOSIS — I69352 Hemiplegia and hemiparesis following cerebral infarction affecting left dominant side: Secondary | ICD-10-CM

## 2021-11-17 DIAGNOSIS — M6281 Muscle weakness (generalized): Secondary | ICD-10-CM | POA: Diagnosis not present

## 2021-11-17 NOTE — Progress Notes (Signed)
HPI F  never smoker followed for OSA, Insomnia, complicated by Migraine, HBP, CVA 2017 (Basal Ganglia ICH)/ aphasia/ dysphagia/ spastic L hemiparesis, Upper Airway Cough, Depression, GERD HST 08/28/2019- AHI 12.3/ hr, desaturation to 75%, body weight 168 lbs  --------------------------------------------------------------------------------------------------    11/18/20- 49 yoF never smoker followed for OSA, Insomnia, complicated by Migraine, HBP, CVA 2017 (Basal Ganglia ICH)/ aphasia/ dysphagia/ spastic L hemiplegia, Upper Airway Cough, Depression, GERD Oral Appliance-Katz Body weight today- 175 lbs Covid vax- 2 Phizer Flu vax- had Hosp in Sept for falling- never received the oral appliance due to issues.  She says Dr Ron Parker wanted a lot of other dental work done and her insurance wouldn't cover it.  Sleeps ok with Sonata 10 mg with no morning carry-over. Insurance only covers 15/ month. She previously failed trial of Trazodone. We will continue sonata- she can't split capsules, so will use them intermittently. We will consider repeat sleep study next year for possible re-trial of CPAP.  CXR w ribs L 08/30/20- FINDINGS: A radiopaque marker was placed at the site of the patient's pain. No fracture or other bone lesions are seen involving the ribs. There is no evidence of pneumothorax or pleural effusion. Both lungs are clear. Heart size and mediastinal contours are within normal limits. IMPRESSION: Negative.  11/18/21- 20 yoF never smoker followed for OSA, Insomnia, complicated by Migraine, HTN, CVA 2017 (Basal Ganglia ICH)/ aphasia/ dysphagia/ spastic L hemiparesis, Upper Airway Cough, Depression, GERD Oral Appliance-Katz Body weight today-156 lbs Covid vax- 5 Phizer Flu vax- had Weight loss confirmed.  She never got oral appliance from Dr. Ron Parker because he wanted other dental work done first and her insurance would not cover.  She does not remember Sonata and may never have tried it.  Limits  herself to 1 cup of coffee in the morning, otherwise she will not sleep well at night.  We discussed this as an insomnia issue, not excessive tiredness as might be expected from OSA.  Husband tells her she snores if really tired. CXR 1V 11/01/21- IMPRESSION: Distal left clavicular fracture. No acute intrathoracic abnormality noted.   ROS-see HPI   + = positive Constitutional:    weight loss, night sweats, fevers, chills, fatigue, lassitude. HEENT:    +headaches, difficulty swallowing, tooth/dental problems, sore throat,       sneezing, itching, ear ache, nasal congestion, post nasal drip, snoring CV:    chest pain, orthopnea, PND, swelling in lower extremities, anasarca,                                   dizziness, palpitations Resp:   shortness of breath with exertion or at rest.                productive cough,   non-productive cough, coughing up of blood.              change in color of mucus.  wheezing.   Skin:    rash or lesions. GI:  No-   heartburn, indigestion, abdominal pain, nausea, vomiting, diarrhea,                 change in bowel habits, loss of appetite GU: dysuria, change in color of urine, no urgency or frequency.   flank pain. MS:   joint pain, stiffness, decreased range of motion, back pain. Neuro-   +L spastic hemiparesis Psych:  change in mood or affect.  depression or  anxiety.   memory loss.  OBJ- Physical Exam General- Alert, Oriented, Affect-appropriate, Distress- none acute,  Skin- rash-none, lesions- none, excoriation- none Lymphadenopathy- none Head- atraumatic            Eyes- Gross vision intact, PERRLA, conjunctivae and secretions clear            Ears- Hearing, canals-normal            Nose- Clear, no-Septal dev, mucus, polyps, erosion, perforation             Throat- Mallampati II-III , mucosa clear , drainage- none, tonsils small, + teeth Neck- flexible , trachea midline, no stridor , thyroid nl, carotid no bruit Chest - symmetrical excursion ,  unlabored           Heart/CV- RRR , no murmur , no gallop  , no rub, nl s1 s2                           - JVD- none , edema- none, stasis changes- none, varices- none           Lung- clear to P&A, wheeze- none, cough- none , dullness-none, rub- none           Chest wall-  Abd-  Br/ Gen/ Rectal- Not done, not indicated Extrem- +splint L calf, + cane, + left arm in sling Neuro- +L spastic hemiplegic.

## 2021-11-17 NOTE — Therapy (Signed)
[facility]. [place], [place], [address] Phone: [phone]   Fax:  [phone]  Physical Therapy Treatment  Patient Details  Name: Abigail Campos MRN: [id number] Date of Birth: January 24, 1971 Referring Provider (PT): Dr Letta Pate   Encounter Date: 11/17/2021   PT End of Session - 11/17/21 1410     Visit Number 4    Number of Visits 17    Date for PT Re-Evaluation 12/24/21    Authorization Type [employer]    Progress Note Due on Visit 10    PT Start Time 1234    PT Stop Time 1318    PT Time Calculation (min) 44 min    Equipment Utilized During Treatment Gait belt    Activity Tolerance Patient tolerated treatment well    Behavior During Therapy [facility] for tasks assessed/performed             Past Medical History:  Diagnosis Date   Anemia    Anxiety and depression    Back pain    Essential hypertension 10/19/2007   03-2010: metoprolol changed to bystolic (was not feeling well on it, no specific allergy or reaction)     Food allergy    Hemiplegia ([place])    Hyperlipemia    Hypertension    Joint pain    Migraine    Neuromuscular disorder ([place])    left sided hemiplegia - has brace for l leg and walks with a cane   Obesity    Ovarian cancer ([place])    Prediabetes    Sleep apnea    not wearing c-pap yet   Sleep apnea    Stroke ([place]) 2017   Vitamin D deficiency     Past Surgical History:  Procedure Laterality Date   ABDOMINAL HYSTERECTOMY  11-14-07   no oophorectomy per surgical report   CESAREAN SECTION     [id number]   INGUINAL HERNIA REPAIR     2004    There were no vitals filed for this visit.   Subjective Assessment - 11/17/21 1243     Subjective Patient reports she is doing ok. She states that she wants to walk without her cane.    Pertinent History PMH: HTN, hx of CVA (2017), ovarian cancer)    Limitations Walking;Standing    How long can you sit comfortably? Sore buttocks after fall.     How long can you stand comfortably? up to 35 minutes.    How long can you walk comfortably? Walks for exercise, tries to get 3000 steps per day.    Patient Stated Goals Stand longer and better, [facility] wihtout a cane, but wearing brace.    Currently in Pain? No/denies    Pain Onset In the past 7 days                               [facility] Adult PT Treatment/Exercise - 11/17/21 0001       Ambulation/Gait   Ambulation/Gait Yes    Ambulation/Gait Assistance 6: Modified independent (Device/Increase time)    Ambulation Distance (Feet) 100 Feet    Assistive device Straight cane    Gait Pattern Step-to pattern;Step-through pattern;Decreased stance time - left;Decreased step length - right;Left hip hike    Ambulation Surface Level;Indoor      Knee/Hip Exercises: Aerobic   Nustep L5 x4 minutes BLEs only, 1 x 5 reps and 1 x 20 reps with level 7  resistance, emphasizing push through LLE to engage more muscular function.      Knee/Hip Exercises: Standing   Gait Training Patient performed step ups onto 4" stepo with LLE, with RUE support on railing. therapist facilitated increaesd LLE activiation by VC and TC patien tto rely less on UE. She then performed stanidng weight shifts onto LLE in stride, therapist facilitating appropriate loading and control in L hip and knee. Progressed to step through with RLE, occ min A to LLE for stability in stance. Repeated with RLE in front to encourage normalized forward progression over LLE in WB, min A.                     PT Education - 11/17/21 1410     Education Details Educated to normalized weight shift and WB in gait, step through gait pattern    Person(s) Educated Patient    Methods Explanation    Comprehension Verbalized understanding              PT Short Term Goals - 11/17/21 1415       PT SHORT TERM GOAL #1   Title Patient will be I with basic HEP    Time 1    Period Weeks    Status On-going    Target Date  11/26/21               PT Long Term Goals - 11/17/21 1414       PT LONG TERM GOAL #1   Title Patient to be independent with final HEP for improved functional mobility, decreased fall risk.    Time 6    Period Weeks    Status On-going      PT LONG TERM GOAL #2   Title Decrease time for 5 x sit to stand test to < 20 seconds.    Baseline 23.87    Time 8    Period Weeks    Status New      PT LONG TERM GOAL #3   Title Pt will decr TUG time to 20 seconds or less with SPC with quad tip in order to demo decr fall risk.    Baseline 24.70    Time 8    Period Weeks    Status New      PT LONG TERM GOAL #4   Title Pt will ambulate at least 300' over indoor level and outdoor unlevel surfaces with LRAD with supervision in order to demo improved community mobility.    Baseline 100' with cane on level surfaces iwth multiple gait deviations.    Time 8    Period Weeks    Status New                   Plan - 11/17/21 1411     Clinical Impression Statement Patient reports no issues. Treatment focused on WB and normalized weight shifts over each LE in stance phase of gait, tryuing to increase LLE WB and less reliance on cane as well as normalized weight shfits and forward progression over stance limb. She responded well, with step through gait pattern after treamtent activities.    Personal Factors and Comorbidities Comorbidity 3+;Time since onset of injury/illness/exacerbation;Fitness;Past/Current Experience    Comorbidities HTN, hx of CVA (2017), ovarian cancer    Examination-Activity Limitations Locomotion Level;Stand;Transfers;Caring for Others    Examination-Participation Restrictions Community Activity    Stability/Clinical Decision Making Stable/Uncomplicated    Rehab Potential Good    PT Frequency 2x /  week    PT Duration 6 weeks   7 weeks   PT Treatment/Interventions ADLs/Self Care Home Management;Aquatic Therapy;DME Instruction;Gait training;Functional mobility  training;Therapeutic activities;Stair training;Therapeutic exercise;Balance training;Neuromuscular re-education;Orthotic Fit/Training;Patient/family education;Passive range of motion;Dry needling;Manual techniques;Iontophoresis 4mg /ml Dexamethasone;Electrical Stimulation    PT Next Visit Plan Progress with strengthening, balance training. Establish HEP to support gains. Contact Chris at [employer].    PT Home Exercise Plan Access Code: [email or web address]    Consulted and Agree with Plan of Care Patient             Patient will benefit from skilled therapeutic intervention in order to improve the following deficits and impairments:  Abnormal gait, Decreased coordination, Decreased range of motion, Difficulty walking, Increased fascial restricitons, Impaired tone, Decreased endurance, Increased muscle spasms, Decreased activity tolerance, Pain, Decreased balance, Impaired flexibility, Improper body mechanics, Decreased mobility, Decreased strength, Impaired sensation, Postural dysfunction  Visit Diagnosis: Muscle weakness (generalized)  Other symptoms and signs involving the nervous system  Abnormality of gait  Hemiplegia and hemiparesis following cerebral infarction affecting left dominant side (HCC)  Hemiplegia, post-stroke (HCC)  History of falling  Other abnormalities of gait and mobility     Problem List Patient Active Problem List   Diagnosis Date Noted   Nausea without vomiting 07/23/2021   Vitamin D deficiency 07/06/2021   History of CVA (cerebrovascular accident) 07/06/2021   Other constipation 03/18/2021   Hot flashes 03/17/2021   Prediabetes 02/11/2021   COVID-19 23/76/2831   Metabolic syndrome 51/76/1607   Neuropathic pain 09/19/2020   Adjustment disorder with mixed anxiety and depressed mood 09/19/2020   Facial droop 08/30/2020   Abnormality of gait 05/09/2020   Hemiplegia of nondominant side, late effect of cerebrovascular disease (HCC) 03/28/2020   Pleural effusion  03/01/2020   Sleep disturbance 02/06/2020   Spastic hemiplegia of left nondominant side due to nontraumatic intraparenchymal hemorrhage of brain (HCC) 12/26/2019   Trigger thumb of right hand 11/02/2019   OSA (obstructive sleep apnea) 07/26/2019   Spastic hemiplegia of left nondominant side as late effect of nontraumatic intraparenchymal hemorrhage of brain (HCC) 04/06/2019   Spastic hemiparesis affecting dominant side (HCC) 01/04/2018   Hyperglycemia 12/29/2016   Depression 12/29/2016   Muscle spasticity    Class 1 obesity with serious comorbidity and body mass index (BMI) of 33.0 to 33.9 in adult 04/08/2016   Hyperlipidemia 04/08/2016   Basal ganglia hemorrhage ([place]) 04/08/2016   Dysphagia as late effect of cerebrovascular disease    Migraine with aura and without status migrainosus, not intractable    Gait disturbance, post-stroke    Hemiplegia, post-stroke (HCC)    Aphasia, post-stroke    Dysarthria, post-stroke    ICH (intracerebral hemorrhage) ([place]) - R basal ganglia due to hypertensive emergency 04/01/2016   Upper airway cough syndrome 12/06/2015   PCP NOTES >>>>>>>>>>>>>>>>>>>>>>>>>>>.. 09/25/2015   Insomnia 12/28/2012   Annual physical exam 11/23/2011   Essential hypertension 10/19/2007    Marcelina Morel, DPT 11/17/2021, 2:16 PM  [facility]. [place], [place], [address] Phone: [phone]   Fax:  [phone]  Name: Abigail Campos MRN: [id number] Date of Birth: 03/03/1971

## 2021-11-18 ENCOUNTER — Encounter: Payer: Self-pay | Admitting: Internal Medicine

## 2021-11-18 ENCOUNTER — Ambulatory Visit (INDEPENDENT_AMBULATORY_CARE_PROVIDER_SITE_OTHER): Payer: No Typology Code available for payment source | Admitting: Internal Medicine

## 2021-11-18 DIAGNOSIS — G4733 Obstructive sleep apnea (adult) (pediatric): Secondary | ICD-10-CM

## 2021-11-18 DIAGNOSIS — I69398 Other sequelae of cerebral infarction: Secondary | ICD-10-CM | POA: Diagnosis not present

## 2021-11-18 DIAGNOSIS — R269 Unspecified abnormalities of gait and mobility: Secondary | ICD-10-CM | POA: Diagnosis not present

## 2021-11-18 NOTE — Assessment & Plan Note (Signed)
Nearly 20 pound weight loss will have helped her sleep apnea.  We discussed symptoms as indicators.  Currently without significant snoring or daytime tiredness.  She describes family stress and various issues, choosing to defer rescheduling a sleep study for now.  I have invited her to return if we can help.

## 2021-11-18 NOTE — Patient Instructions (Signed)
You seem to be doing well now- except for the fall and collar bone.  If snoring and daytime tiredness get worse, then sleep apnea might be getting worse and we can talk about getting a new sleep study. Otherwise, please just let us know if we can help.

## 2021-11-18 NOTE — Assessment & Plan Note (Signed)
Recent fall with injury to her left clavicle.  She is following up with orthopedics.

## 2021-11-19 ENCOUNTER — Encounter: Payer: Self-pay | Admitting: Orthopedic Surgery

## 2021-11-19 ENCOUNTER — Ambulatory Visit (INDEPENDENT_AMBULATORY_CARE_PROVIDER_SITE_OTHER): Payer: No Typology Code available for payment source | Admitting: Orthopedic Surgery

## 2021-11-19 ENCOUNTER — Ambulatory Visit (INDEPENDENT_AMBULATORY_CARE_PROVIDER_SITE_OTHER): Payer: No Typology Code available for payment source

## 2021-11-19 ENCOUNTER — Other Ambulatory Visit: Payer: Self-pay

## 2021-11-19 DIAGNOSIS — S42032A Displaced fracture of lateral end of left clavicle, initial encounter for closed fracture: Secondary | ICD-10-CM | POA: Diagnosis not present

## 2021-11-19 DIAGNOSIS — M25512 Pain in left shoulder: Secondary | ICD-10-CM

## 2021-11-19 MED ORDER — TRAMADOL HCL 50 MG PO TABS: 50.0000 mg | ORAL_TABLET | Freq: Two times a day (BID) | ORAL | 0 refills | Status: DC | PRN

## 2021-11-20 ENCOUNTER — Encounter: Payer: Self-pay | Admitting: Physical Therapy

## 2021-11-20 ENCOUNTER — Ambulatory Visit: Payer: No Typology Code available for payment source | Admitting: Physical Therapy

## 2021-11-20 DIAGNOSIS — R29818 Other symptoms and signs involving the nervous system: Secondary | ICD-10-CM

## 2021-11-20 DIAGNOSIS — R269 Unspecified abnormalities of gait and mobility: Secondary | ICD-10-CM

## 2021-11-20 DIAGNOSIS — I69352 Hemiplegia and hemiparesis following cerebral infarction affecting left dominant side: Secondary | ICD-10-CM | POA: Diagnosis not present

## 2021-11-20 DIAGNOSIS — R2689 Other abnormalities of gait and mobility: Secondary | ICD-10-CM

## 2021-11-20 DIAGNOSIS — I69359 Hemiplegia and hemiparesis following cerebral infarction affecting unspecified side: Secondary | ICD-10-CM | POA: Diagnosis not present

## 2021-11-20 DIAGNOSIS — Z9181 History of falling: Secondary | ICD-10-CM

## 2021-11-20 DIAGNOSIS — M6281 Muscle weakness (generalized): Secondary | ICD-10-CM

## 2021-11-20 NOTE — Therapy (Signed)
[facility] [place], [place], [address] Phone: [phone]   Fax:  [phone]  Physical Therapy Treatment  Patient Details  Name: Abigail Campos Campos: [id number] Date of Birth: Apr 18, 1971 Referring Provider (PT): Dr Letta Pate   Encounter Date: 11/20/2021   PT End of Session - 11/20/21 1243     Visit Number 5    Number of Visits 17    Date for PT Re-Evaluation 12/24/21    Authorization Type [employer]    Progress Note Due on Visit 10    PT Start Time 1145    PT Stop Time 1225    PT Time Calculation (min) 40 min    Equipment Utilized During Treatment Gait belt    Activity Tolerance Patient tolerated treatment well    Behavior During Therapy [facility] for tasks assessed/performed             Past Medical History:  Diagnosis Date   Anemia    Anxiety and depression    Back pain    Essential hypertension 10/19/2007   03-2010: metoprolol changed to bystolic (was not feeling well on it, no specific allergy or reaction)     Food allergy    Hemiplegia ([place])    Hyperlipemia    Hypertension    Joint pain    Migraine    Neuromuscular disorder ([place])    left sided hemiplegia - has brace for l leg and walks with a cane   Obesity    Ovarian cancer ([place])    Prediabetes    Sleep apnea    not wearing c-pap yet   Sleep apnea    Stroke ([place]) 2017   Vitamin D deficiency     Past Surgical History:  Procedure Laterality Date   ABDOMINAL HYSTERECTOMY  11-14-07   no oophorectomy per surgical report   CESAREAN SECTION     [id number]   INGUINAL HERNIA REPAIR     2004    There were no vitals filed for this visit.   Subjective Assessment - 11/20/21 1150     Subjective Patient reports B hip pain. She returned to ortho Dr who told her that her shoulder was not healing as well as he would like. Await 2 more weeks and then determine appropriate F/U.    Pertinent History PMH: HTN, hx of CVA (2017), ovarian cancer)     Currently in Pain? Yes    Pain Score 6     Pain Location Hip    Pain Orientation Right;Left    Pain Descriptors / Indicators Aching    Pain Type Acute pain;Chronic pain    Pain Onset In the past 7 days    Pain Frequency Intermittent    Aggravating Factors  Activity.    Pain Relieving Factors Tylenol    Multiple Pain Sites No                [facility] PT Assessment - 11/20/21 0001       Palpation   Palpation comment Patient reports burning in R lateral/postrior hip, 3-4" above greater trochanter. ROM at hip and LE does not change sensation, nor doestrunk movement over stable base in sitting. She thinks she may have injured it when she fell and broke her clavicle, but this is the first time she is reporting pain.                           [facility] Adult  PT Treatment/Exercise - 11/20/21 0001       Ambulation/Gait   Ambulation/Gait Yes    Ambulation/Gait Assistance 7: Independent    Ambulation Distance (Feet) 200 Feet    Assistive device None    Gait Pattern Step-through pattern;Left circumduction;Left hip hike;Trendelenburg    Ambulation Surface Level;Indoor      Knee/Hip Exercises: Aerobic   Nustep L5 x4 minutes BLEs only,  1 x 20 reps with level 7 resistance, emphasizing push through LLE to engage more muscular function.      Knee/Hip Exercises: Standing   Gait Training Step ups onto Air Ex pad iwth LLE, stepping up and over her LLE with RLE, 2 x 10 reps, excellent hip and knee control on L in WB.                       PT Short Term Goals - 11/17/21 1415       PT SHORT TERM GOAL #1   Title Patient will be I with basic HEP    Time 1    Period Weeks    Status On-going    Target Date 11/26/21               PT Long Term Goals - 11/17/21 1414       PT LONG TERM GOAL #1   Title Patient to be independent with final HEP for improved functional mobility, decreased fall risk.    Time 6    Period Weeks    Status On-going      PT LONG  TERM GOAL #2   Title Decrease time for 5 x sit to stand test to < 20 seconds.    Baseline 23.87    Time 8    Period Weeks    Status New      PT LONG TERM GOAL #3   Title Pt will decr TUG time to 20 seconds or less with SPC with quad tip in order to demo decr fall risk.    Baseline 24.70    Time 8    Period Weeks    Status New      PT LONG TERM GOAL #4   Title Pt will ambulate at least 300' over indoor level and outdoor unlevel surfaces with LRAD with supervision in order to demo improved community mobility.    Baseline 100' with cane on level surfaces iwth multiple gait deviations.    Time 8    Period Weeks    Status New                   Plan - 11/20/21 1244     Clinical Impression Statement Patient reports pain in B hips, although she only pointed to R lateral/post hip and reported burning sensation. Pain unchanged with any mobilizaiton of R LE or trunk on seated BOS. Does not appear to be soft tissue related. Patient plans to monitor pain and present to urgent care if it does not improve. He gait was significantly imporved, able to walk wihtout AD wiht step through pattern and improved WB through LLE.    Personal Factors and Comorbidities Comorbidity 3+;Time since onset of injury/illness/exacerbation;Fitness;Past/Current Experience    Comorbidities HTN, hx of CVA (2017), ovarian cancer    Examination-Activity Limitations Locomotion Level;Stand;Transfers;Caring for Others    Examination-Participation Restrictions Community Activity    Stability/Clinical Decision Making Stable/Uncomplicated    Clinical Decision Making Low    Rehab Potential Good    PT Frequency 2x /  week    PT Duration 6 weeks   7 weeks   PT Treatment/Interventions ADLs/Self Care Home Management;Aquatic Therapy;DME Instruction;Gait training;Functional mobility training;Therapeutic activities;Stair training;Therapeutic exercise;Balance training;Neuromuscular re-education;Orthotic  Fit/Training;Patient/family education;Passive range of motion;Dry needling;Manual techniques;Iontophoresis 4mg /ml Dexamethasone;Electrical Stimulation    PT Next Visit Plan Challenge walking backwards and changing directions.    PT Home Exercise Plan Access Code: [email or web address]    Consulted and Agree with Plan of Care Patient             Patient will benefit from skilled therapeutic intervention in order to improve the following deficits and impairments:  Abnormal gait, Decreased coordination, Decreased range of motion, Difficulty walking, Increased fascial restricitons, Impaired tone, Decreased endurance, Increased muscle spasms, Decreased activity tolerance, Pain, Decreased balance, Impaired flexibility, Improper body mechanics, Decreased mobility, Decreased strength, Impaired sensation, Postural dysfunction  Visit Diagnosis: Muscle weakness (generalized)  Other symptoms and signs involving the nervous system  Abnormality of gait  Hemiplegia and hemiparesis following cerebral infarction affecting left dominant side (HCC)  Hemiplegia, post-stroke (HCC)  History of falling  Other abnormalities of gait and mobility     Problem List Patient Active Problem List   Diagnosis Date Noted   Nausea without vomiting 07/23/2021   Vitamin D deficiency 07/06/2021   History of CVA (cerebrovascular accident) 07/06/2021   Other constipation 03/18/2021   Hot flashes 03/17/2021   Prediabetes 02/11/2021   COVID-19 82/99/3716   Metabolic syndrome 96/78/9381   Neuropathic pain 09/19/2020   Adjustment disorder with mixed anxiety and depressed mood 09/19/2020   Facial droop 08/30/2020   Abnormality of gait 05/09/2020   Hemiplegia of nondominant side, late effect of cerebrovascular disease (HCC) 03/28/2020   Pleural effusion 03/01/2020   Sleep disturbance 02/06/2020   Spastic hemiplegia of left nondominant side due to nontraumatic intraparenchymal hemorrhage of brain (HCC) 12/26/2019   Trigger  thumb of right hand 11/02/2019   OSA (obstructive sleep apnea) 07/26/2019   Spastic hemiplegia of left nondominant side as late effect of nontraumatic intraparenchymal hemorrhage of brain (HCC) 04/06/2019   Spastic hemiparesis affecting dominant side (HCC) 01/04/2018   Hyperglycemia 12/29/2016   Depression 12/29/2016   Muscle spasticity    Class 1 obesity with serious comorbidity and body mass index (BMI) of 33.0 to 33.9 in adult 04/08/2016   Hyperlipidemia 04/08/2016   Basal ganglia hemorrhage ([place]) 04/08/2016   Dysphagia as late effect of cerebrovascular disease    Migraine with aura and without status migrainosus, not intractable    Gait disturbance, post-stroke    Hemiplegia, post-stroke (HCC)    Aphasia, post-stroke    Dysarthria, post-stroke    ICH (intracerebral hemorrhage) ([place]) - R basal ganglia due to hypertensive emergency 04/01/2016   Upper airway cough syndrome 12/06/2015   PCP NOTES >>>>>>>>>>>>>>>>>>>>>>>>>>>.. 09/25/2015   Insomnia 12/28/2012   Annual physical exam 11/23/2011   Essential hypertension 10/19/2007    Marcelina Morel, DPT 11/20/2021, 12:49 PM  [facility] [place], [place], [address] Phone: [phone]   Fax:  [phone]  Name: Abigail Campos: [id number] Date of Birth: 01-12-1971

## 2021-11-22 NOTE — Progress Notes (Signed)
Office Visit Note   Patient: Abigail Campos           Date of Birth: [birth year]           MRN: [id number] Visit Date: 11/19/2021 Requested by: Colon Branch, [address] [place],  [place] [address] PCP: Colon Branch, MD  Subjective: Chief Complaint  Patient presents with   Left Shoulder - New Patient (Initial Visit)    HPI: Abigail Campos is a patient with left shoulder pain.  She had a stroke 04/01/2016 which has left her left arm less than fully functional.  Patient had a fall 2011 19 2022 going up the stairs.  No prior shoulder surgery.  Some movements cause popping.  The pain has caused an increase in her baseline amount of neck pain.              ROS: All systems reviewed are negative as they relate to the chief complaint within the history of present illness.  Patient denies  fevers or chills.   Assessment & Plan: Visit Diagnoses:  1. Left shoulder pain, unspecified chronicity   2. Displaced fracture of lateral end of left clavicle, initial encounter for closed fracture     Plan: Impression is minimally displaced lateral end clavicle fracture with not too much in the way of coracoclavicular distance widening.  This should be a self-limited fracture.  There is still some motion to the fracture at this time.  Continue with sling immobilization and come back in 2 weeks for clinical recheck and decision for or against removal of the sling based on callus formation and clinical examination.  Follow-Up Instructions: Return in about 2 weeks (around 12/03/2021).   Orders:  Orders Placed This Encounter  Procedures   XR Clavicle Left   Meds ordered this encounter  Medications   traMADol (ULTRAM) 50 MG tablet    Sig: Take 1 tablet (50 mg total) by mouth every 12 (twelve) hours as needed.    Dispense:  30 tablet    Refill:  0      Procedures: No procedures performed   Clinical Data: No additional findings.  Objective: Vital Signs: There were no vitals taken  for this visit.  Physical Exam:   Constitutional: Patient appears well-developed HEENT:  Head: Normocephalic Eyes:EOM are normal Neck: Normal range of motion Cardiovascular: Normal rate Pulmonary/chest: Effort normal Neurologic: Patient is alert Skin: Skin is warm Psychiatric: Patient has normal mood and affect  Ortho Exam: Ortho exam demonstrates not very much voluntary motion at the left wrist and elbow.  Some function of the deltoid is present.  Has tenderness to palpation on the distal end of the clavicle which does have some micromotion palpable.  Not too much swelling or bruising is present.  Radial pulse intact about the left hand.  Specialty Comments:  No specialty comments available.  Imaging: No results found.   PMFS History: Patient Active Problem List   Diagnosis Date Noted   Nausea without vomiting 07/23/2021   Vitamin D deficiency 07/06/2021   History of CVA (cerebrovascular accident) 07/06/2021   Other constipation 03/18/2021   Hot flashes 03/17/2021   Prediabetes 02/11/2021   COVID-19 06/08/9484   Metabolic syndrome 46/27/0350   Neuropathic pain 09/19/2020   Adjustment disorder with mixed anxiety and depressed mood 09/19/2020   Facial droop 08/30/2020   Abnormality of gait 05/09/2020   Hemiplegia of nondominant side, late effect of cerebrovascular disease ([place]) 03/28/2020   Pleural effusion 03/01/2020  Sleep disturbance 02/06/2020   Spastic hemiplegia of left nondominant side due to nontraumatic intraparenchymal hemorrhage of brain ([place]) 12/26/2019   Trigger thumb of right hand 11/02/2019   OSA (obstructive sleep apnea) 07/26/2019   Spastic hemiplegia of left nondominant side as late effect of nontraumatic intraparenchymal hemorrhage of brain ([place]) 04/06/2019   Spastic hemiparesis affecting dominant side (HCC) 01/04/2018   Hyperglycemia 12/29/2016   Depression 12/29/2016   Muscle spasticity    Class 1 obesity with serious comorbidity and body mass  index (BMI) of 33.0 to 33.9 in adult 04/08/2016   Hyperlipidemia 04/08/2016   Basal ganglia hemorrhage ([place]) 04/08/2016   Dysphagia as late effect of cerebrovascular disease    Migraine with aura and without status migrainosus, not intractable    Gait disturbance, post-stroke    Hemiplegia, post-stroke (HCC)    Aphasia, post-stroke    Dysarthria, post-stroke    ICH (intracerebral hemorrhage) ([place]) - R basal ganglia due to hypertensive emergency 04/01/2016   Upper airway cough syndrome 12/06/2015   PCP NOTES >>>>>>>>>>>>>>>>>>>>>>>>>>>.. 09/25/2015   Insomnia 12/28/2012   Annual physical exam 11/23/2011   Essential hypertension 10/19/2007   Past Medical History:  Diagnosis Date   Anemia    Anxiety and depression    Back pain    Essential hypertension 10/19/2007   03-2010: metoprolol changed to bystolic (was not feeling well on it, no specific allergy or reaction)     Food allergy    Hemiplegia (HCC)    Hyperlipemia    Hypertension    Joint pain    Migraine    Neuromuscular disorder ([place])    left sided hemiplegia - has brace for l leg and walks with a cane   Obesity    Ovarian cancer ([place])    Prediabetes    Sleep apnea    not wearing c-pap yet   Sleep apnea    Stroke ([place]) 2017   Vitamin D deficiency     Family History  Problem Relation Age of Onset   Diabetes Mother    Hypertension Mother    Glaucoma Mother    Colon cancer Mother        mother age 37   Cancer Mother    Anxiety disorder Mother    Obesity Mother    Rectal cancer Father        dx 63   Hypertension Father    Cancer Father    Sleep apnea Father    Alcoholism Father    Obesity Father    Stroke Paternal Grandfather    Stroke Maternal Grandfather    Diabetes Brother    Hypertension Brother    Heart disease Maternal Aunt        x 2 did 2012 CAD-CHF   Diabetes Brother    Hypertension Brother    Lung cancer Maternal Aunt    Breast cancer Neg Hx    Esophageal cancer Neg Hx    Stomach cancer Neg Hx      Past Surgical History:  Procedure Laterality Date   ABDOMINAL HYSTERECTOMY  11-14-07   no oophorectomy per surgical report   CESAREAN SECTION     [id number]   INGUINAL HERNIA REPAIR     2004   Social History   Occupational History   Occupation: disable d/t stroke TEACHER    Employer: [employer]  Tobacco Use   Smoking status: Never   Smokeless tobacco: Never  Vaping Use   Vaping Use: Never used  Substance and Sexual Activity  Alcohol use: No   Drug use: No   Sexual activity: Not Currently    Birth control/protection: Surgical

## 2021-11-24 ENCOUNTER — Encounter: Payer: Self-pay | Admitting: Internal Medicine

## 2021-11-24 ENCOUNTER — Ambulatory Visit (INDEPENDENT_AMBULATORY_CARE_PROVIDER_SITE_OTHER): Payer: No Typology Code available for payment source | Admitting: Internal Medicine

## 2021-11-24 VITALS — BP 136/84 | HR 79 | Temp 97.8°F | Resp 18 | Ht <= 58 in | Wt 148.0 lb

## 2021-11-24 DIAGNOSIS — R739 Hyperglycemia, unspecified: Secondary | ICD-10-CM | POA: Diagnosis not present

## 2021-11-24 DIAGNOSIS — E785 Hyperlipidemia, unspecified: Secondary | ICD-10-CM

## 2021-11-24 DIAGNOSIS — E559 Vitamin D deficiency, unspecified: Secondary | ICD-10-CM

## 2021-11-24 DIAGNOSIS — Z0000 Encounter for general adult medical examination without abnormal findings: Secondary | ICD-10-CM | POA: Diagnosis not present

## 2021-11-24 DIAGNOSIS — I10 Essential (primary) hypertension: Secondary | ICD-10-CM | POA: Diagnosis not present

## 2021-11-24 DIAGNOSIS — R399 Unspecified symptoms and signs involving the genitourinary system: Secondary | ICD-10-CM

## 2021-11-24 LAB — CBC WITH DIFFERENTIAL/PLATELET
Basophils Absolute: 0 10*3/uL (ref 0.0–0.1)
Basophils Relative: 0.2 % (ref 0.0–3.0)
Eosinophils Absolute: 0 10*3/uL (ref 0.0–0.7)
Eosinophils Relative: 1 % (ref 0.0–5.0)
HCT: 38.7 % (ref 36.0–46.0)
Hemoglobin: 12.4 g/dL (ref 12.0–15.0)
Lymphocytes Relative: 38.8 % (ref 12.0–46.0)
Lymphs Abs: 1.3 10*3/uL (ref 0.7–4.0)
MCHC: 32.2 g/dL (ref 30.0–36.0)
MCV: 93.8 fl (ref 78.0–100.0)
Monocytes Absolute: 0.3 10*3/uL (ref 0.1–1.0)
Monocytes Relative: 8.6 % (ref 3.0–12.0)
Neutro Abs: 1.7 10*3/uL (ref 1.4–7.7)
Neutrophils Relative %: 51.4 % (ref 43.0–77.0)
Platelets: 172 10*3/uL (ref 150.0–400.0)
RBC: 4.12 Mil/uL (ref 3.87–5.11)
RDW: 13.7 % (ref 11.5–15.5)
WBC: 3.4 10*3/uL — ABNORMAL LOW (ref 4.0–10.5)

## 2021-11-24 LAB — COMPREHENSIVE METABOLIC PANEL WITH GFR
ALT: 26 U/L (ref 0–35)
AST: 18 U/L (ref 0–37)
Albumin: 3.9 g/dL (ref 3.5–5.2)
Alkaline Phosphatase: 85 U/L (ref 39–117)
BUN: 15 mg/dL (ref 6–23)
CO2: 30 meq/L (ref 19–32)
Calcium: 9.6 mg/dL (ref 8.4–10.5)
Chloride: 103 meq/L (ref 96–112)
Creatinine, Ser: 0.92 mg/dL (ref 0.40–1.20)
GFR: 73.86 mL/min
Glucose, Bld: 80 mg/dL (ref 70–99)
Potassium: 3.8 meq/L (ref 3.5–5.1)
Sodium: 140 meq/L (ref 135–145)
Total Bilirubin: 0.6 mg/dL (ref 0.2–1.2)
Total Protein: 7.3 g/dL (ref 6.0–8.3)

## 2021-11-24 LAB — URINALYSIS, ROUTINE W REFLEX MICROSCOPIC
Bilirubin Urine: NEGATIVE
Hgb urine dipstick: NEGATIVE
Ketones, ur: NEGATIVE
Leukocytes,Ua: NEGATIVE
Nitrite: NEGATIVE
RBC / HPF: NONE SEEN (ref 0–?)
Specific Gravity, Urine: 1.025 (ref 1.000–1.030)
Total Protein, Urine: NEGATIVE
Urine Glucose: NEGATIVE
Urobilinogen, UA: 0.2 (ref 0.0–1.0)
pH: 6 (ref 5.0–8.0)

## 2021-11-24 LAB — HEMOGLOBIN A1C: Hgb A1c MFr Bld: 5.6 % (ref 4.6–6.5)

## 2021-11-24 LAB — VITAMIN D 25 HYDROXY (VIT D DEFICIENCY, FRACTURES): VITD: 30.82 ng/mL (ref 30.00–100.00)

## 2021-11-24 NOTE — Assessment & Plan Note (Signed)
-  Tdap 2015 -prevnar 12/2016;  PNM 23: 2019 - Covid vax  UTD -Had a flu shot - female care: Per gynecology, [facility] 08-25-2020    -CCS: +FH M (age 50) C-scope 10/06/2019, next per GI -Labs reviewed, check >>> UA, urine culture, CMP, CBC A1c, vitamin D

## 2021-11-24 NOTE — Assessment & Plan Note (Signed)
Here for CPX Prediabetes: Check A1c HTN: Seems well controlled. Hyperlipidemia: Last FLP very good. Depression, insomnia: When I saw her in June she was doing well, now has depression, frequent crying, denies suicidal ideas, but the idea of harming his family has crossed her mind.  I asked if I let her go if she will act on those feelings and she said no.  She has a gun but gave it to her parents for safekeeping. Depression triggers?  She has some arguments at home, son is a teenager and having some attitude issues. Plan: Strongly encouraged to get established with a counselor.  Also information about immediate care provided. See AVS LUTS: UA urine culture OSA: Recently saw pulmonary, still not using a CPAP. RTC 2 months

## 2021-11-24 NOTE — Progress Notes (Signed)
Subjective:    Patient ID: Abigail Campos, female    DOB: 19-Aug-1971, 50 y.o.   MRN: [id number]  DOS:  11/24/2021 Type of visit - description: CPX  In addition to CPX we addressed other issues: Had a fall November 18, Dx distal L clavicular fracture. Seen by Ortho, last visit 11/19/2021, Rx conservative treatment with sling immobilization  Also, depression has increased for the last 3 months, she is constantly crying, denies any suicidal thoughts but has "think about hurting my son and my husband".  Reports some urinary frequency but denies dysuria or gross hematuria  Review of Systems  Other than above, a 14 point review of systems is negative      Past Medical History:  Diagnosis Date   Anemia    Anxiety and depression    Back pain    Essential hypertension 10/19/2007   03-2010: metoprolol changed to bystolic (was not feeling well on it, no specific allergy or reaction)     Food allergy    Hemiplegia (HCC)    Hyperlipemia    Hypertension    Joint pain    Migraine    Neuromuscular disorder ([place])    left sided hemiplegia - has brace for l leg and walks with a cane   Obesity    Ovarian cancer ([place])    Prediabetes    Sleep apnea    not wearing c-pap yet   Sleep apnea    Stroke ([place]) 2017   Vitamin D deficiency     Past Surgical History:  Procedure Laterality Date   ABDOMINAL HYSTERECTOMY  11-14-07   no oophorectomy per surgical report   CESAREAN SECTION     [id number]   INGUINAL HERNIA REPAIR     2004   Social History   Socioeconomic History   Marital status: Married    Spouse name: [name]   Number of children: 2   Years of education: Not on file   Highest education level: Not on file  Occupational History   Occupation: disable d/t stroke TEACHER    Employer: [employer]  Tobacco Use   Smoking status: Never   Smokeless tobacco: Never  Vaping Use   Vaping Use: Never used  Substance and Sexual Activity   Alcohol use: No   Drug use: No   Sexual  activity: Not Currently    Birth control/protection: Surgical  Other Topics Concern   Not on file  Social History Narrative   Lives with husband and son   Left Hand prior to stroke   Drinks 1-2 cups daily   Social Determinants of Health   Financial Resource Strain: Not on file  Food Insecurity: Not on file  Transportation Needs: Not on file  Physical Activity: Not on file  Stress: Not on file  Social Connections: Not on file  Intimate Partner Violence: Not on file     Allergies as of 11/24/2021       Reactions   Bee Venom Anaphylaxis   Peanut-containing Drug Products Anaphylaxis   Aspirin Other (See Comments)   Reports blisters w/ ASA but pt reports is ok w/ Motrin, Advil, naproxen   Isovue [iopamidol] Hives, Itching   Pt broke out with one facial hive.  Had itching on her right upper ant and posterior arm w/o evidence of hives.  Pt given water and 25mg  benadryl po.  We observed pt for 30 minutes before d/c.  J. Bohm     Latex Hives   Ambien [zolpidem Tartrate] Other (  See Comments)   forgetfulness   Hydrocodone Itching   Generalized itching w/o rash        Medication List        Accurate as of November 24, 2021 11:58 AM. If you have any questions, ask your nurse or doctor.          STOP taking these medications    Pfizer COVID-19 Vac Bivalent injection Generic drug: COVID-19 mRNA bivalent vaccine Therapist, music) Stopped by: Kathlene November, MD       TAKE these medications    atorvastatin 20 MG tablet Commonly known as: LIPITOR TAKE 1 TABLET BY MOUTH EVERY DAY   baclofen 10 MG tablet Commonly known as: LIORESAL Take 3 tablets (30 mg total) by mouth 3 (three) times daily.   buPROPion 100 MG tablet Commonly known as: WELLBUTRIN TAKE 1 TABLET BY MOUTH TWICE A DAY   carvedilol 6.25 MG tablet Commonly known as: COREG Take 1 tablet (6.25 mg total) by mouth 2 (two) times daily with a meal.   clopidogrel 75 MG tablet Commonly known as: PLAVIX TAKE 1 TABLET BY  MOUTH EVERY DAY   escitalopram 20 MG tablet Commonly known as: LEXAPRO TAKE 1 TABLET BY MOUTH EVERY DAY   gabapentin 300 MG capsule Commonly known as: NEURONTIN TAKE 1 CAPSULE BY MOUTH THREE TIMES A DAY   lisinopril 2.5 MG tablet Commonly known as: ZESTRIL Take 1 tablet (2.5 mg total) by mouth daily.   oxyCODONE-acetaminophen 5-325 MG tablet Commonly known as: PERCOCET/ROXICET Take 1 tablet by mouth every 8 (eight) hours as needed for severe pain.   tirzepatide 5 MG/0.5ML Pen Commonly known as: MOUNJARO Inject 5 mg into the skin once a week.   traMADol 50 MG tablet Commonly known as: ULTRAM Take 1 tablet (50 mg total) by mouth every 12 (twelve) hours as needed.   traZODone 100 MG tablet Commonly known as: DESYREL Take 1 tablet (100 mg total) by mouth at bedtime as needed for sleep.           Objective:   Physical Exam BP 136/84 (BP Location: Right Arm, Patient Position: Sitting, Cuff Size: Small)   Pulse 79   Temp 97.8 F (36.6 C) (Oral)   Resp 18   Ht 4\' 10"  (1.473 m)   Wt 148 lb (67.1 kg)   SpO2 97%   BMI 30.93 kg/m  General: Well developed, NAD, BMI noted Neck: No  thyromegaly  HEENT:  Normocephalic . Face symmetric, atraumatic Lungs:  CTA B Normal respiratory effort, no intercostal retractions, no accessory muscle use. Heart: RRR,  no murmur.  Upper extremities: Has left arm, sling Lower extremities: no pretibial edema bilaterally  Skin: Exposed areas without rash. Not pale. Not jaundice Neurologic:  alert & oriented X3.  Speech normal, gait unchanged from previous exams Psych: Cognition and judgment appear intact.  Cooperative with normal attention span and concentration.  Behavior appropriate. No anxious but depressed appearing, tearing.     Assessment     Assessment   Prediabetes: A1c 6.0 (April 2017)  HTN Hyperlipidemia Depression, insomnia GERD Stroke 03-2016: [place] (L)  spastic hemiplegia   ICH, right basilar ganglia due to HTN  emergency, + encephalopathy, + dysphagia, aphasia, dysarthria Residual L spasticity.  CTA neck 03-2016 neg CTA head 10-17 (-) aneurysm Headaches , migraines dx after 2nd pregnancy, (-) CT head 2014 and 2015 Cough, persisting, saw Dr. Melvyn Novas 11-2015 OSA: Sleep study 08-2019, saw Dr. Annamaria Boots, intolerant to CPAP  COVID infection 01-2021   PLAN Here for  CPX Prediabetes: Check A1c HTN: Seems well controlled. Hyperlipidemia: Last FLP very good. Depression, insomnia: When I saw her in June she was doing well, now has depression, frequent crying, denies suicidal ideas, but the idea of harming his family has crossed her mind.  I asked if I let her go if she will act on those feelings and she said no.  She has a gun but gave it to her parents for safekeeping. Depression triggers?  She has some arguments at home, son is a teenager and having some attitude issues. Plan: Strongly encouraged to get established with a counselor.  Also information about immediate care provided. See AVS LUTS: UA urine culture OSA: Recently saw pulmonary, still not using a CPAP. RTC 2 months     This visit occurred during the SARS-CoV-2 public health emergency.  Safety protocols were in place, including screening questions prior to the visit, additional usage of staff PPE, and extensive cleaning of exam room while observing appropriate contact time as indicated for disinfecting solutions.

## 2021-11-24 NOTE — Patient Instructions (Signed)
Establish with a counselor as soon as you can.  For immediate help:  [facility] for [facility] For 24/7 walk-up access to mental health services for [facility] children (4+), adolescents and adults, please visit the new [facility] located at [address] in [place], [place].  By Phone Call our 24-hour HelpLine at [phone] or [phone] for immediate assistance for mental health and substance abuse issues.   [employer] Network: 1-800-SUICIDE [employer] Suicide Prevention Lifeline: 1-800-273-TALK  GO TO THE LAB : Get the blood work     [place], Dante back for a checkup in 2 months

## 2021-11-25 ENCOUNTER — Other Ambulatory Visit: Payer: Self-pay

## 2021-11-25 ENCOUNTER — Encounter (INDEPENDENT_AMBULATORY_CARE_PROVIDER_SITE_OTHER): Payer: Self-pay | Admitting: Family Medicine

## 2021-11-25 ENCOUNTER — Ambulatory Visit: Payer: No Typology Code available for payment source | Admitting: Physical Therapy

## 2021-11-25 ENCOUNTER — Encounter: Payer: Self-pay | Admitting: Physical Therapy

## 2021-11-25 DIAGNOSIS — Z9181 History of falling: Secondary | ICD-10-CM

## 2021-11-25 DIAGNOSIS — R29818 Other symptoms and signs involving the nervous system: Secondary | ICD-10-CM | POA: Diagnosis not present

## 2021-11-25 DIAGNOSIS — I69359 Hemiplegia and hemiparesis following cerebral infarction affecting unspecified side: Secondary | ICD-10-CM | POA: Diagnosis not present

## 2021-11-25 DIAGNOSIS — R7303 Prediabetes: Secondary | ICD-10-CM

## 2021-11-25 DIAGNOSIS — M6281 Muscle weakness (generalized): Secondary | ICD-10-CM

## 2021-11-25 DIAGNOSIS — R269 Unspecified abnormalities of gait and mobility: Secondary | ICD-10-CM

## 2021-11-25 DIAGNOSIS — R2689 Other abnormalities of gait and mobility: Secondary | ICD-10-CM

## 2021-11-25 DIAGNOSIS — I69352 Hemiplegia and hemiparesis following cerebral infarction affecting left dominant side: Secondary | ICD-10-CM | POA: Diagnosis not present

## 2021-11-25 LAB — URINE CULTURE
MICRO NUMBER:: 12744343
SPECIMEN QUALITY:: ADEQUATE

## 2021-11-25 MED ORDER — TIRZEPATIDE 5 MG/0.5ML ~~LOC~~ SOAJ: 5.0000 mg | SUBCUTANEOUS | 0 refills | Status: DC

## 2021-11-25 NOTE — Telephone Encounter (Signed)
LOV w/ Dr. Juleen China

## 2021-11-25 NOTE — Therapy (Signed)
[place] [place], [place], [address] Phone: [phone]   Fax:  [phone]  Physical Therapy Treatment  Patient Details  Name: Abigail Campos MRN: [id number] Date of Birth: 04/30/1971 Referring Provider (PT): Dr Letta Pate   Encounter Date: 11/25/2021   PT End of Session - 11/25/21 1321     Visit Number 6    Number of Visits 17    Date for PT Re-Evaluation 12/24/21    Authorization Type [employer]na Medicare    Progress Note Due on Visit 10    PT Start Time 1236    PT Stop Time 1315    PT Time Calculation (min) 39 min    Equipment Utilized During Treatment Gait belt    Activity Tolerance Patient tolerated treatment well    Behavior During Therapy [facility] for tasks assessed/performed             Past Medical History:  Diagnosis Date   Anemia    Anxiety and depression    Back pain    Essential hypertension 10/19/2007   03-2010: metoprolol changed to bystolic (was not feeling well on it, no specific allergy or reaction)     Food allergy    Hemiplegia ([place])    Hyperlipemia    Hypertension    Joint pain    Migraine    Neuromuscular disorder ([place])    left sided hemiplegia - has brace for l leg and walks with a cane   Obesity    Ovarian cancer ([place])    Prediabetes    Sleep apnea    not wearing c-pap yet   Sleep apnea    Stroke ([place]) 2017   Vitamin D deficiency     Past Surgical History:  Procedure Laterality Date   ABDOMINAL HYSTERECTOMY  11-14-07   no oophorectomy per surgical report   CESAREAN SECTION     [id number]   INGUINAL HERNIA REPAIR     2004    There were no vitals filed for this visit.   Subjective Assessment - 11/25/21 1243     Subjective Patient reports B hip pain is resolved, but she C/O L neck pain. Dr Marlou Sa to assess when she returns to see him on 12/02/21.    Pertinent History PMH: HTN, hx of CVA (2017), ovarian cancer)    Limitations Walking;Standing    How long can you sit  comfortably? Sore buttocks after fall.    How long can you stand comfortably? up to 35 minutes.    How long can you walk comfortably? Walks for exercise, tries to get 3000 steps per day.    Patient Stated Goals Stand longer and better, [facility] wihtout a cane, but wearing brace.    Currently in Pain? Yes    Pain Score 3     Pain Location Neck    Pain Orientation Left;Lateral;Lower;Mid;Upper    Pain Descriptors / Indicators Sore    Pain Type Acute pain    Pain Onset In the past 7 days    Pain Frequency Intermittent                               [facility] Adult PT Treatment/Exercise - 11/25/21 0001       Knee/Hip Exercises: Aerobic   Nustep L5 x 5 minutes      Knee/Hip Exercises: Machines for Strengthening   Total Gym Leg Press BLE press agaoinst 20 #. Legs strapped  to gether to prevent L hip ER, therapist also helped guide LLE into extension and flexion, ensured the limb was active and engaged thorughout. 10 reps      Knee/Hip Exercises: Standing   Other Standing Knee Exercises Stood with LLE on pillowcase. Performed slides out to the side and back, required min A at times to return to center. Repeated with LLE  in stance, therapsit guarding L knee in flexion to prevent buckling.      Knee/Hip Exercises: Sidelying   Other Sidelying Knee/Hip Exercises Patient sat on edge of mat, leaned onto R elbow. She attempted to lift LLE up and to the side, to touch heel to top of mat. She had great diffiulty activating hip abductors, tried to rotate to her L so she could use hip flexors. Required up to mod A to lift limb to surface of mat, x 10 reps.                     PT Education - 11/25/21 1319     Education Details Educated to rationale for NM re-education to LLE to increase lateral hip strength and stability.    Person(s) Educated Patient    Methods Explanation    Comprehension Verbalized understanding              PT Short Term Goals - 11/25/21 1325        PT SHORT TERM GOAL #1   Title Patient will be I with basic HEP    Time 1    Period Weeks    Status Achieved    Target Date 11/26/21               PT Long Term Goals - 11/17/21 1414       PT LONG TERM GOAL #1   Title Patient to be independent with final HEP for improved functional mobility, decreased fall risk.    Time 6    Period Weeks    Status On-going      PT LONG TERM GOAL #2   Title Decrease time for 5 x sit to stand test to < 20 seconds.    Baseline 23.87    Time 8    Period Weeks    Status New      PT LONG TERM GOAL #3   Title Pt will decr TUG time to 20 seconds or less with SPC with quad tip in order to demo decr fall risk.    Baseline 24.70    Time 8    Period Weeks    Status New      PT LONG TERM GOAL #4   Title Pt will ambulate at least 300' over indoor level and outdoor unlevel surfaces with LRAD with supervision in order to demo improved community mobility.    Baseline 100' with cane on level surfaces iwth multiple gait deviations.    Time 8    Period Weeks    Status New                   Plan - 11/25/21 1322     Clinical Impression Statement Patient's hip pain is resolved. She arrived without her sling, reports that the UE keeps sliding out of it and she is tired of it. Reports decreased pain and pulling at fracture, but reports L sided neck pain. Dr Marlou Sa to assess when she returns to see him 12/20. Treatment emphasizing LLE stability to improve balance, decrease fall risk.  Personal Factors and Comorbidities Comorbidity 3+;Time since onset of injury/illness/exacerbation;Fitness;Past/Current Experience    Comorbidities HTN, hx of CVA (2017), ovarian cancer    Examination-Activity Limitations Locomotion Level;Stand;Transfers;Caring for Others    Examination-Participation Restrictions Community Activity    Stability/Clinical Decision Making Stable/Uncomplicated    Clinical Decision Making Low    Rehab Potential Good    PT Frequency 2x  / week    PT Duration 6 weeks   7 weeks   PT Treatment/Interventions ADLs/Self Care Home Management;Aquatic Therapy;DME Instruction;Gait training;Functional mobility training;Therapeutic activities;Stair training;Therapeutic exercise;Balance training;Neuromuscular re-education;Orthotic Fit/Training;Patient/family education;Passive range of motion;Dry needling;Manual techniques;Iontophoresis 4mg /ml Dexamethasone;Electrical Stimulation    PT Next Visit Plan Challenge L hip stability in all planes.    PT Home Exercise Plan Access Code: [email or web address]    Consulted and Agree with Plan of Care Patient             Patient will benefit from skilled therapeutic intervention in order to improve the following deficits and impairments:  Abnormal gait, Decreased coordination, Decreased range of motion, Difficulty walking, Increased fascial restricitons, Impaired tone, Decreased endurance, Increased muscle spasms, Decreased activity tolerance, Pain, Decreased balance, Impaired flexibility, Improper body mechanics, Decreased mobility, Decreased strength, Impaired sensation, Postural dysfunction  Visit Diagnosis: Muscle weakness (generalized)  Other symptoms and signs involving the nervous system  Abnormality of gait  Hemiplegia and hemiparesis following cerebral infarction affecting left dominant side (HCC)  Hemiplegia, post-stroke (HCC)  History of falling  Other abnormalities of gait and mobility     Problem List Patient Active Problem List   Diagnosis Date Noted   Nausea without vomiting 07/23/2021   Vitamin D deficiency 07/06/2021   History of CVA (cerebrovascular accident) 07/06/2021   Other constipation 03/18/2021   Hot flashes 03/17/2021   Prediabetes 02/11/2021   COVID-19 77/41/2878   Metabolic syndrome 67/67/2094   Neuropathic pain 09/19/2020   Adjustment disorder with mixed anxiety and depressed mood 09/19/2020   Facial droop 08/30/2020   Abnormality of gait 05/09/2020    Hemiplegia of nondominant side, late effect of cerebrovascular disease (HCC) 03/28/2020   Pleural effusion 03/01/2020   Sleep disturbance 02/06/2020   Spastic hemiplegia of left nondominant side due to nontraumatic intraparenchymal hemorrhage of brain (HCC) 12/26/2019   Trigger thumb of right hand 11/02/2019   OSA (obstructive sleep apnea) 07/26/2019   Spastic hemiplegia of left nondominant side as late effect of nontraumatic intraparenchymal hemorrhage of brain (HCC) 04/06/2019   Spastic hemiparesis affecting dominant side (HCC) 01/04/2018   Hyperglycemia 12/29/2016   Depression 12/29/2016   Muscle spasticity    Class 1 obesity with serious comorbidity and body mass index (BMI) of 33.0 to 33.9 in adult 04/08/2016   Hyperlipidemia 04/08/2016   Basal ganglia hemorrhage ([place]) 04/08/2016   Dysphagia as late effect of cerebrovascular disease    Migraine with aura and without status migrainosus, not intractable    Gait disturbance, post-stroke    Hemiplegia, post-stroke (HCC)    Aphasia, post-stroke    Dysarthria, post-stroke    ICH (intracerebral hemorrhage) ([place]) - R basal ganglia due to hypertensive emergency 04/01/2016   Upper airway cough syndrome 12/06/2015   PCP NOTES >>>>>>>>>>>>>>>>>>>>>>>>>>>.. 09/25/2015   Insomnia 12/28/2012   Annual physical exam 11/23/2011   Essential hypertension 10/19/2007    Marcelina Morel, DPT 11/25/2021, 1:29 PM  [place] [address] [place], [place], [address] Phone: [phone]   Fax:  [phone]  Name: SEMONE ORLOV MRN: [id number] Date of Birth: 1971/08/23

## 2021-11-27 ENCOUNTER — Encounter: Payer: Self-pay | Admitting: Physical Therapy

## 2021-11-27 ENCOUNTER — Ambulatory Visit: Payer: No Typology Code available for payment source | Admitting: Physical Therapy

## 2021-11-27 ENCOUNTER — Other Ambulatory Visit: Payer: Self-pay

## 2021-11-27 DIAGNOSIS — Z9181 History of falling: Secondary | ICD-10-CM

## 2021-11-27 DIAGNOSIS — R2689 Other abnormalities of gait and mobility: Secondary | ICD-10-CM

## 2021-11-27 DIAGNOSIS — R269 Unspecified abnormalities of gait and mobility: Secondary | ICD-10-CM | POA: Diagnosis not present

## 2021-11-27 DIAGNOSIS — I69352 Hemiplegia and hemiparesis following cerebral infarction affecting left dominant side: Secondary | ICD-10-CM

## 2021-11-27 DIAGNOSIS — M6281 Muscle weakness (generalized): Secondary | ICD-10-CM | POA: Diagnosis not present

## 2021-11-27 DIAGNOSIS — R29818 Other symptoms and signs involving the nervous system: Secondary | ICD-10-CM

## 2021-11-27 DIAGNOSIS — I69359 Hemiplegia and hemiparesis following cerebral infarction affecting unspecified side: Secondary | ICD-10-CM | POA: Diagnosis not present

## 2021-11-27 NOTE — Therapy (Signed)
[place]. [place], [place], [address] Phone: [phone]   Fax:  [phone]  Physical Therapy Treatment  Patient Details  Name: Abigail Campos MRN: [id number] Date of Birth: Mar 16, 1971 Referring Provider (PT): Dr Letta Pate   Encounter Date: 11/27/2021   PT End of Session - 11/27/21 1234     Visit Number 7    Number of Visits 17    Date for PT Re-Evaluation 12/24/21    Authorization Type [employer]na Medicare    Progress Note Due on Visit 10    PT Start Time 1147    PT Stop Time 1226    PT Time Calculation (min) 39 min    Equipment Utilized During Treatment Gait belt    Activity Tolerance Patient tolerated treatment well    Behavior During Therapy [facility] for tasks assessed/performed             Past Medical History:  Diagnosis Date   Anemia    Anxiety and depression    Back pain    Essential hypertension 10/19/2007   03-2010: metoprolol changed to bystolic (was not feeling well on it, no specific allergy or reaction)     Food allergy    Hemiplegia ([place])    Hyperlipemia    Hypertension    Joint pain    Migraine    Neuromuscular disorder ([place])    left sided hemiplegia - has brace for l leg and walks with a cane   Obesity    Ovarian cancer ([place])    Prediabetes    Sleep apnea    not wearing c-pap yet   Sleep apnea    Stroke ([place]) 2017   Vitamin D deficiency     Past Surgical History:  Procedure Laterality Date   ABDOMINAL HYSTERECTOMY  11-14-07   no oophorectomy per surgical report   CESAREAN SECTION     [id number]   INGUINAL HERNIA REPAIR     2004    There were no vitals filed for this visit.   Subjective Assessment - 11/27/21 1208     Subjective neck pain remains, mild though.    Pertinent History PMH: HTN, hx of CVA (2017), ovarian cancer)    Limitations Walking;Standing    Currently in Pain? Yes    Pain Score 3     Pain Location Neck    Pain Orientation Left;Lower;Lateral    Pain  Descriptors / Indicators Sore    Pain Type Acute pain    Pain Onset In the past 7 days    Pain Frequency Intermittent                               [facility] Adult PT Treatment/Exercise - 11/27/21 0001       Ambulation/Gait   Ambulation/Gait Yes    Ambulation/Gait Assistance 7: Independent    Ambulation Distance (Feet) 200 Feet    Assistive device None    Gait Pattern Step-to pattern;Step-through pattern;Decreased step length - right;Decreased step length - left;Decreased stance time - left;Decreased weight shift to left;Left flexed knee in stance;Lateral trunk lean to left    Ambulation Surface Level;Indoor    Gait Comments patient demosntratedslightly imporved weight shift onto LLE after treatment activities to increase LLE WB and control.      Knee/Hip Exercises: Aerobic   Nustep L 4 x 5 minutes iwth 2 x 20 reps at level 7 resistance.  Knee/Hip Exercises: Standing   Other Standing Knee Exercises Placed R foot on 2" step, then attempted to weight shift onto LLE and perform controlled knee flexion and L trunk rotation. Required up to mod A for balance and Lhip and knee control.      Knee/Hip Exercises: Supine   Other Supine Knee/Hip Exercises Bridge over ball, but she relied too heavily on RLE, so activity stopped.    Other Supine Knee/Hip Exercises Placed L foot against physioball, pushing into controlled L knee extension, then lifted RLE off surface while maintaining the L activation. Required mod TC to maintain control.                     PT Education - 11/27/21 1233     Education Details Educated to learned diuse of LLE and activities to facilitate improved control and activation of LLE.    Person(s) Educated Patient    Methods Explanation    Comprehension Verbalized understanding              PT Short Term Goals - 11/25/21 1325       PT SHORT TERM GOAL #1   Title Patient will be I with basic HEP    Time 1    Period Weeks     Status Achieved    Target Date 11/26/21               PT Long Term Goals - 11/17/21 1414       PT LONG TERM GOAL #1   Title Patient to be independent with final HEP for improved functional mobility, decreased fall risk.    Time 6    Period Weeks    Status On-going      PT LONG TERM GOAL #2   Title Decrease time for 5 x sit to stand test to < 20 seconds.    Baseline 23.87    Time 8    Period Weeks    Status New      PT LONG TERM GOAL #3   Title Pt will decr TUG time to 20 seconds or less with SPC with quad tip in order to demo decr fall risk.    Baseline 24.70    Time 8    Period Weeks    Status New      PT LONG TERM GOAL #4   Title Pt will ambulate at least 300' over indoor level and outdoor unlevel surfaces with LRAD with supervision in order to demo improved community mobility.    Baseline 100' with cane on level surfaces iwth multiple gait deviations.    Time 8    Period Weeks    Status New                   Plan - 11/27/21 1235     Clinical Impression Statement No reports of hip pain. treatment focused on increasing LLE activation and trying to improve functional use of LLE. Patient currently relies heavily on RLE for all mobility. She responded to extreme challenge of standing with RLE elevated on step, requiring her to acitvate LLE, required up to mod A.    Personal Factors and Comorbidities Comorbidity 3+;Time since onset of injury/illness/exacerbation;Fitness;Past/Current Experience    Comorbidities HTN, hx of CVA (2017), ovarian cancer    Examination-Activity Limitations Locomotion Level;Stand;Transfers;Caring for Others    Examination-Participation Restrictions Community Activity    Stability/Clinical Decision Making Stable/Uncomplicated    Clinical Decision Making Low    Rehab  Potential Good    PT Frequency 2x / week    PT Duration 6 weeks   7 weeks   PT Treatment/Interventions ADLs/Self Care Home Management;Aquatic Therapy;DME  Instruction;Gait training;Functional mobility training;Therapeutic activities;Stair training;Therapeutic exercise;Balance training;Neuromuscular re-education;Orthotic Fit/Training;Patient/family education;Passive range of motion;Dry needling;Manual techniques;Iontophoresis 4mg /ml Dexamethasone;Electrical Stimulation    PT Next Visit Plan Cont NM re-education of LLE to increase strength and motor control.    PT Home Exercise Plan Access Code: [email or web address]    Consulted and Agree with Plan of Care Patient             Patient will benefit from skilled therapeutic intervention in order to improve the following deficits and impairments:  Abnormal gait, Decreased coordination, Decreased range of motion, Difficulty walking, Increased fascial restricitons, Impaired tone, Decreased endurance, Increased muscle spasms, Decreased activity tolerance, Pain, Decreased balance, Impaired flexibility, Improper body mechanics, Decreased mobility, Decreased strength, Impaired sensation, Postural dysfunction  Visit Diagnosis: Muscle weakness (generalized)  Other symptoms and signs involving the nervous system  Abnormality of gait  Hemiplegia and hemiparesis following cerebral infarction affecting left dominant side (HCC)  History of falling  Other abnormalities of gait and mobility     Problem List Patient Active Problem List   Diagnosis Date Noted   Nausea without vomiting 07/23/2021   Vitamin D deficiency 07/06/2021   History of CVA (cerebrovascular accident) 07/06/2021   Other constipation 03/18/2021   Hot flashes 03/17/2021   Prediabetes 02/11/2021   COVID-19 41/32/4401   Metabolic syndrome 02/72/5366   Neuropathic pain 09/19/2020   Adjustment disorder with mixed anxiety and depressed mood 09/19/2020   Facial droop 08/30/2020   Abnormality of gait 05/09/2020   Hemiplegia of nondominant side, late effect of cerebrovascular disease (HCC) 03/28/2020   Pleural effusion 03/01/2020   Sleep  disturbance 02/06/2020   Spastic hemiplegia of left nondominant side due to nontraumatic intraparenchymal hemorrhage of brain (HCC) 12/26/2019   Trigger thumb of right hand 11/02/2019   OSA (obstructive sleep apnea) 07/26/2019   Spastic hemiplegia of left nondominant side as late effect of nontraumatic intraparenchymal hemorrhage of brain (HCC) 04/06/2019   Spastic hemiparesis affecting dominant side (HCC) 01/04/2018   Hyperglycemia 12/29/2016   Depression 12/29/2016   Muscle spasticity    Class 1 obesity with serious comorbidity and body mass index (BMI) of 33.0 to 33.9 in adult 04/08/2016   Hyperlipidemia 04/08/2016   Basal ganglia hemorrhage ([place]) 04/08/2016   Dysphagia as late effect of cerebrovascular disease    Migraine with aura and without status migrainosus, not intractable    Gait disturbance, post-stroke    Hemiplegia, post-stroke (HCC)    Aphasia, post-stroke    Dysarthria, post-stroke    ICH (intracerebral hemorrhage) ([place]) - R basal ganglia due to hypertensive emergency 04/01/2016   Upper airway cough syndrome 12/06/2015   PCP NOTES >>>>>>>>>>>>>>>>>>>>>>>>>>>.. 09/25/2015   Insomnia 12/28/2012   Annual physical exam 11/23/2011   Essential hypertension 10/19/2007    Marcelina Morel, DPT 11/27/2021, 12:38 PM  [place]. [place], [place], [address] Phone: [phone]   Fax:  [phone]  Name: Abigail Campos MRN: [id number] Date of Birth: 09-14-1971

## 2021-12-02 ENCOUNTER — Encounter (INDEPENDENT_AMBULATORY_CARE_PROVIDER_SITE_OTHER): Payer: Self-pay | Admitting: Family Medicine

## 2021-12-02 ENCOUNTER — Other Ambulatory Visit: Payer: Self-pay

## 2021-12-02 ENCOUNTER — Ambulatory Visit (INDEPENDENT_AMBULATORY_CARE_PROVIDER_SITE_OTHER): Payer: No Typology Code available for payment source | Admitting: Family Medicine

## 2021-12-02 VITALS — BP 127/86 | HR 79 | Temp 98.1°F | Ht 59.0 in | Wt 147.0 lb

## 2021-12-02 DIAGNOSIS — E669 Obesity, unspecified: Secondary | ICD-10-CM

## 2021-12-02 DIAGNOSIS — R7301 Impaired fasting glucose: Secondary | ICD-10-CM

## 2021-12-02 DIAGNOSIS — I10 Essential (primary) hypertension: Secondary | ICD-10-CM

## 2021-12-02 DIAGNOSIS — Z8673 Personal history of transient ischemic attack (TIA), and cerebral infarction without residual deficits: Secondary | ICD-10-CM

## 2021-12-02 DIAGNOSIS — Z6833 Body mass index (BMI) 33.0-33.9, adult: Secondary | ICD-10-CM

## 2021-12-03 ENCOUNTER — Ambulatory Visit (INDEPENDENT_AMBULATORY_CARE_PROVIDER_SITE_OTHER): Payer: No Typology Code available for payment source | Admitting: Orthopedic Surgery

## 2021-12-03 ENCOUNTER — Encounter: Payer: Self-pay | Admitting: Orthopedic Surgery

## 2021-12-03 ENCOUNTER — Ambulatory Visit (INDEPENDENT_AMBULATORY_CARE_PROVIDER_SITE_OTHER): Payer: No Typology Code available for payment source

## 2021-12-03 DIAGNOSIS — S42032A Displaced fracture of lateral end of left clavicle, initial encounter for closed fracture: Secondary | ICD-10-CM

## 2021-12-03 NOTE — Progress Notes (Signed)
Chief Complaint:   OBESITY Abigail Campos is here to discuss her progress with her obesity treatment plan along with follow-up of her obesity related diagnoses. See Medical Weight Management Flowsheet for complete bioelectrical impedance results.  Today's visit was #: 15 Starting weight: 164 lbs Starting date: 12/26/2020 Weight change since last visit: 3 lbs Total lbs lost to date: 17 lbs Total weight loss percentage to date: -10.37%  Nutrition Plan: Category 1 Plan for 0% of the time.  Activity: Therapy. Anti-obesity medications: Mounjaro 5 mg subcutaneously weekly. Reported side effects: None.  Interim History: [name] says she has been eating salmon daily along with fruits and vegetables and 1/2 brown rice.  Her goal is 145 pounds (2 pounds away!).  She says she got a black leather skirt for Christmas and feels confident.  She has been moving around more, feeling happy.  She says she is the "[employer]".  Assessment/Plan:   1. Impaired fasting glucose, with polyphagia Controlled. Current treatment: Mounjaro 5 mg subcutaneously weekly.    Plan:  Continue Mounjaro 5 mg subcutaneously weekly.  She will continue to focus on protein-rich, low simple carbohydrate foods. We reviewed the importance of hydration, regular exercise for stress reduction, and restorative sleep.  2. Essential hypertension At goal. Medications: Coreg 6.25 mg twice daily, lisinopril 2.5 mg daily.   Plan:  Continue current medications. Avoid buying foods that are: processed, frozen, or prepackaged to avoid excess salt. We will watch for signs of hypotension as she continues lifestyle modifications.  BP Readings from Last 3 Encounters:  12/02/21 127/86  11/24/21 136/84  11/18/21 118/68   Lab Results  Component Value Date   CREATININE 0.92 11/24/2021   3. History of CVA (cerebrovascular accident), with residual left-sided weakness PCP manages Lipitor 20 mg daily. She has a history of CVA in 2017 with residual  right-sided deficits. We will continue to monitor symptoms as they relate to her weight loss journey.  4. Obesity BMI today is 29.9  Course: Amariya is currently in the action stage of change. As such, her goal is to continue with weight loss efforts.   Nutrition goals: She has agreed to practicing portion control and making smarter food choices, such as increasing vegetables and decreasing simple carbohydrates.   Exercise goals:  As is.  Behavioral modification strategies: increasing lean protein intake, decreasing simple carbohydrates, increasing vegetables, and increasing water intake.  [name] has agreed to follow-up with our clinic in 4 weeks. She was informed of the importance of frequent follow-up visits to maximize her success with intensive lifestyle modifications for her multiple health conditions.   Objective:   Blood pressure 127/86, pulse 79, temperature 98.1 F (36.7 C), temperature source Oral, height 4\' 11"  (1.499 m), weight 147 lb (66.7 kg), SpO2 98 %. Body mass index is 29.69 kg/m.  General: Cooperative, alert, well developed, in no acute distress. HEENT: Conjunctivae and lids unremarkable. Cardiovascular: Regular rhythm.  Lungs: Normal work of breathing. Neurologic: No focal deficits.   Lab Results  Component Value Date   CREATININE 0.92 11/24/2021   BUN 15 11/24/2021   NA 140 11/24/2021   K 3.8 11/24/2021   CL 103 11/24/2021   CO2 30 11/24/2021   Lab Results  Component Value Date   ALT 26 11/24/2021   AST 18 11/24/2021   ALKPHOS 85 11/24/2021   BILITOT 0.6 11/24/2021   Lab Results  Component Value Date   HGBA1C 5.6 11/24/2021   HGBA1C 5.8 05/20/2021   HGBA1C 6.0 11/19/2020  HGBA1C 6.1 (H) 08/31/2020   HGBA1C 5.7 02/14/2020   Lab Results  Component Value Date   INSULIN 11.5 06/25/2021   Lab Results  Component Value Date   TSH 0.737 06/11/2021   Lab Results  Component Value Date   CHOL 127 06/25/2021   HDL 51 06/25/2021   LDLCALC 64  06/25/2021   TRIG 53 06/25/2021   CHOLHDL 2.5 06/25/2021   Lab Results  Component Value Date   VD25OH 30.82 11/24/2021   VD25OH 33.3 06/25/2021   VD25OH 17.70 (L) 11/19/2020   Lab Results  Component Value Date   WBC 3.4 (L) 11/24/2021   HGB 12.4 11/24/2021   HCT 38.7 11/24/2021   MCV 93.8 11/24/2021   PLT 172.0 11/24/2021   Lab Results  Component Value Date   IRON 97 12/26/2020   TIBC 313 12/26/2020   FERRITIN 72 12/26/2020   Attestation Statements:   Reviewed by clinician on day of visit: allergies, medications, problem list, medical history, surgical history, family history, social history, and previous encounter notes.  I, Water quality scientist, CMA, am acting as transcriptionist for Briscoe Deutscher, DO  I have reviewed the above documentation for accuracy and completeness, and I agree with the above. -  Briscoe Deutscher, DO, MS, FAAFP, DABOM - Family and Bariatric Medicine.

## 2021-12-08 ENCOUNTER — Encounter: Payer: Self-pay | Admitting: Orthopedic Surgery

## 2021-12-08 NOTE — Progress Notes (Signed)
Post-Op Visit Note   Patient: Abigail Campos           Date of Birth: 06/12/1971           MRN: [id number] Visit Date: 12/03/2021 PCP: Colon Branch, MD   Assessment & Plan:  Chief Complaint:  Chief Complaint  Patient presents with   Left Shoulder - Follow-up   Visit Diagnoses:  1. Displaced fracture of lateral end of left clavicle, initial encounter for closed fracture     Plan: Patient presents now for follow-up of left clavicle fracture.  Ambulating with cane to help her balance.  Hard to sleep on that left-hand side.  On exam she has a stroke affecting the left-hand side.  Minimal tenderness to direct palpation of the clavicle.  Passive range of motion of the shoulder intact.  Again the arm is very weak due to prior stroke.  Plan at this time is discontinue sling use based on no displacement of fracture and some callus formation present.  Follow-up in 4 weeks for final clinical recheck.  Radiographs not needed at that time.  As well make sure she has no pain and there is no motion at the fracture site with range of motion.  Follow-Up Instructions: No follow-ups on file.   Orders:  Orders Placed This Encounter  Procedures   XR Clavicle Left   No orders of the defined types were placed in this encounter.   Imaging: No results found.  PMFS History: Patient Active Problem List   Diagnosis Date Noted   Nausea without vomiting 07/23/2021   Vitamin D deficiency 07/06/2021   History of CVA (cerebrovascular accident) 07/06/2021   Other constipation 03/18/2021   Hot flashes 03/17/2021   Prediabetes 02/11/2021   COVID-19 12/16/7251   Metabolic syndrome 66/44/0347   Neuropathic pain 09/19/2020   Adjustment disorder with mixed anxiety and depressed mood 09/19/2020   Facial droop 08/30/2020   Abnormality of gait 05/09/2020   Hemiplegia of nondominant side, late effect of cerebrovascular disease ([place]) 03/28/2020   Pleural effusion 03/01/2020   Sleep disturbance  02/06/2020   Spastic hemiplegia of left nondominant side due to nontraumatic intraparenchymal hemorrhage of brain ([place]) 12/26/2019   Trigger thumb of right hand 11/02/2019   OSA (obstructive sleep apnea) 07/26/2019   Spastic hemiplegia of left nondominant side as late effect of nontraumatic intraparenchymal hemorrhage of brain ([place]) 04/06/2019   Spastic hemiparesis affecting dominant side ([place]) 01/04/2018   Hyperglycemia 12/29/2016   Depression 12/29/2016   Muscle spasticity    Class 1 obesity with serious comorbidity and body mass index (BMI) of 33.0 to 33.9 in adult 04/08/2016   Hyperlipidemia 04/08/2016   Basal ganglia hemorrhage ([place]) 04/08/2016   Dysphagia as late effect of cerebrovascular disease    Migraine with aura and without status migrainosus, not intractable    Gait disturbance, post-stroke    Hemiplegia, post-stroke (HCC)    Aphasia, post-stroke    Dysarthria, post-stroke    ICH (intracerebral hemorrhage) ([place]) - R basal ganglia due to hypertensive emergency 04/01/2016   Upper airway cough syndrome 12/06/2015   PCP NOTES >>>>>>>>>>>>>>>>>>>>>>>>>>>.. 09/25/2015   Insomnia 12/28/2012   Annual physical exam 11/23/2011   Essential hypertension 10/19/2007   Past Medical History:  Diagnosis Date   Anemia    Anxiety and depression    Back pain    Essential hypertension 10/19/2007   03-2010: metoprolol changed to bystolic (was not feeling well on it, no specific allergy or reaction)  Food allergy    Hemiplegia ([place])    Hyperlipemia    Hypertension    Joint pain    Migraine    Neuromuscular disorder ([place])    left sided hemiplegia - has brace for l leg and walks with a cane   Obesity    Ovarian cancer ([place])    Prediabetes    Sleep apnea    not wearing c-pap yet   Sleep apnea    Stroke ([place]) 2017   Vitamin D deficiency     Family History  Problem Relation Age of Onset   Diabetes Mother    Hypertension Mother    Glaucoma Mother    Colon cancer Mother         mother age 80   Cancer Mother    Anxiety disorder Mother    Obesity Mother    Rectal cancer Father        dx 11   Hypertension Father    Cancer Father    Sleep apnea Father    Alcoholism Father    Obesity Father    Stroke Paternal Grandfather    Stroke Maternal Grandfather    Diabetes Brother    Hypertension Brother    Heart disease Maternal Aunt        x 2 did 2012 CAD-CHF   Diabetes Brother    Hypertension Brother    Lung cancer Maternal Aunt    Breast cancer Neg Hx    Esophageal cancer Neg Hx    Stomach cancer Neg Hx     Past Surgical History:  Procedure Laterality Date   ABDOMINAL HYSTERECTOMY  11-14-07   no oophorectomy per surgical report   CESAREAN SECTION     [id number]   INGUINAL HERNIA REPAIR     2004   Social History   Occupational History   Occupation: disable d/t stroke TEACHER    Employer: [employer]  Tobacco Use   Smoking status: Never   Smokeless tobacco: Never  Vaping Use   Vaping Use: Never used  Substance and Sexual Activity   Alcohol use: No   Drug use: No   Sexual activity: Not Currently    Birth control/protection: Surgical

## 2021-12-09 ENCOUNTER — Ambulatory Visit: Payer: No Typology Code available for payment source | Admitting: Physical Therapy

## 2021-12-09 ENCOUNTER — Other Ambulatory Visit: Payer: Self-pay

## 2021-12-09 ENCOUNTER — Encounter: Payer: Self-pay | Admitting: Physical Therapy

## 2021-12-09 DIAGNOSIS — R269 Unspecified abnormalities of gait and mobility: Secondary | ICD-10-CM | POA: Diagnosis not present

## 2021-12-09 DIAGNOSIS — R29818 Other symptoms and signs involving the nervous system: Secondary | ICD-10-CM | POA: Diagnosis not present

## 2021-12-09 DIAGNOSIS — Z9181 History of falling: Secondary | ICD-10-CM

## 2021-12-09 DIAGNOSIS — M6281 Muscle weakness (generalized): Secondary | ICD-10-CM | POA: Diagnosis not present

## 2021-12-09 DIAGNOSIS — I69352 Hemiplegia and hemiparesis following cerebral infarction affecting left dominant side: Secondary | ICD-10-CM | POA: Diagnosis not present

## 2021-12-09 DIAGNOSIS — I69359 Hemiplegia and hemiparesis following cerebral infarction affecting unspecified side: Secondary | ICD-10-CM | POA: Diagnosis not present

## 2021-12-09 NOTE — Therapy (Signed)
[place]. [place], [place], [address] Phone: [phone]   Fax:  [phone]  Physical Therapy Treatment  Patient Details  Name: Abigail Campos MRN: [id number] Date of Birth: 1971/05/20 Referring Provider (PT): Dr Letta Pate   Encounter Date: 12/09/2021   PT End of Session - 12/09/21 1114     Visit Number 8    Number of Visits 17    Date for PT Re-Evaluation 12/24/21    Authorization Type Aetna Medicare    Progress Note Due on Visit 10    PT Start Time 1018    PT Stop Time 1057    PT Time Calculation (min) 39 min    Equipment Utilized During Treatment Gait belt    Activity Tolerance Patient tolerated treatment well    Behavior During Therapy [facility] for tasks assessed/performed             Past Medical History:  Diagnosis Date   Anemia    Anxiety and depression    Back pain    Essential hypertension 10/19/2007   03-2010: metoprolol changed to bystolic (was not feeling well on it, no specific allergy or reaction)     Food allergy    Hemiplegia ([place])    Hyperlipemia    Hypertension    Joint pain    Migraine    Neuromuscular disorder ([address])    left sided hemiplegia - has brace for l leg and walks with a cane   Obesity    Ovarian cancer ([place])    Prediabetes    Sleep apnea    not wearing c-pap yet   Sleep apnea    Stroke ([place]) 2017   Vitamin D deficiency     Past Surgical History:  Procedure Laterality Date   ABDOMINAL HYSTERECTOMY  11-14-07   no oophorectomy per surgical report   CESAREAN SECTION     [id number]   INGUINAL HERNIA REPAIR     2004    There were no vitals filed for this visit.   Subjective Assessment - 12/09/21 1020     Subjective I'm doing OK, Dr. Marlou Sa took me out of the sling when I saw him last. I had a fall on Christmas Eve. I was sleeping and my family woke up me up, I was rushing around to get something together for a Christmas eve party and I fell. I haven't had more  pain in that arm so I haven't gotten it checked out yet, it feels ok.    Pertinent History PMH: HTN, hx of CVA (2017), ovarian cancer)    Patient Stated Goals Stand longer and better, [facility] wihtout a cane, but wearing brace.    Currently in Pain? Yes    Pain Score 3     Pain Location Arm    Pain Orientation Left    Pain Descriptors / Indicators Discomfort;Sore                               [facility] Adult PT Treatment/Exercise - 12/09/21 0001       Knee/Hip Exercises: Standing   Lateral Step Up 1 set;10 reps;Left    Lateral Step Up Limitations 4 inch step U UEU support    Forward Step Up Both;1 set;10 reps    Forward Step Up Limitations 4 inch step U UE support when stepping with L LE    Other Standing Knee Exercises forward and side stepping over  TB 1x6 B    Other Standing Knee Exercises backwards walking x2 laps (12ft per lap)                     PT Education - 12/09/21 1114     Education Details exercise form and purpose    Person(s) Educated Patient    Methods Explanation    Comprehension Verbalized understanding              PT Short Term Goals - 11/25/21 1325       PT SHORT TERM GOAL #1   Title Patient will be I with basic HEP    Time 1    Period Weeks    Status Achieved    Target Date 11/26/21               PT Long Term Goals - 11/17/21 1414       PT LONG TERM GOAL #1   Title Patient to be independent with final HEP for improved functional mobility, decreased fall risk.    Time 6    Period Weeks    Status On-going      PT LONG TERM GOAL #2   Title Decrease time for 5 x sit to stand test to < 20 seconds.    Baseline 23.87    Time 8    Period Weeks    Status New      PT LONG TERM GOAL #3   Title Pt will decr TUG time to 20 seconds or less with SPC with quad tip in order to demo decr fall risk.    Baseline 24.70    Time 8    Period Weeks    Status New      PT LONG TERM GOAL #4   Title Pt will ambulate at least  300' over indoor level and outdoor unlevel surfaces with LRAD with supervision in order to demo improved community mobility.    Baseline 100' with cane on level surfaces iwth multiple gait deviations.    Time 8    Period Weeks    Status New                   Plan - 12/09/21 1114     Clinical Impression Statement Abigail Campos arrives today doing well, does tell me she had a fall on Christmas Eve but not having extra pain in her arm after the fall. Focused more on weight bearing and strengthening L LE today, including stepping over obstacles as well as up/down steps. Needed assist to maintain knee alignment during standing tasks as well as cues to reduce circumduction and improve foot clearance, as well as to reduce knee buckling and hyperextension with challenge. Will continue to progress as able.    Personal Factors and Comorbidities Comorbidity 3+;Time since onset of injury/illness/exacerbation;Fitness;Past/Current Experience    Comorbidities HTN, hx of CVA (2017), ovarian cancer    Examination-Activity Limitations Locomotion Level;Stand;Transfers;Caring for Others    Examination-Participation Restrictions Community Activity    Stability/Clinical Decision Making Stable/Uncomplicated    Clinical Decision Making Low    Rehab Potential Good    PT Frequency 2x / week    PT Duration Other (comment)   7 weeks   PT Treatment/Interventions ADLs/Self Care Home Management;Aquatic Therapy;DME Instruction;Gait training;Functional mobility training;Therapeutic activities;Stair training;Therapeutic exercise;Balance training;Neuromuscular re-education;Orthotic Fit/Training;Patient/family education;Passive range of motion;Dry needling;Manual techniques;Iontophoresis 4mg /ml Dexamethasone;Electrical Stimulation    PT Next Visit Plan Cont NM re-education of LLE to increase strength and motor control.  PT Home Exercise Plan Access Code: [email or web address]    Consulted and Agree with Plan of Care Patient              Patient will benefit from skilled therapeutic intervention in order to improve the following deficits and impairments:  Abnormal gait, Decreased coordination, Decreased range of motion, Difficulty walking, Increased fascial restricitons, Impaired tone, Decreased endurance, Increased muscle spasms, Decreased activity tolerance, Pain, Decreased balance, Impaired flexibility, Improper body mechanics, Decreased mobility, Decreased strength, Impaired sensation, Postural dysfunction  Visit Diagnosis: Muscle weakness (generalized)  Other symptoms and signs involving the nervous system  Abnormality of gait  Hemiplegia and hemiparesis following cerebral infarction affecting left dominant side (HCC)  History of falling     Problem List Patient Active Problem List   Diagnosis Date Noted   Nausea without vomiting 07/23/2021   Vitamin D deficiency 07/06/2021   History of CVA (cerebrovascular accident) 07/06/2021   Other constipation 03/18/2021   Hot flashes 03/17/2021   Prediabetes 02/11/2021   COVID-19 45/02/8881   Metabolic syndrome 80/02/4916   Neuropathic pain 09/19/2020   Adjustment disorder with mixed anxiety and depressed mood 09/19/2020   Facial droop 08/30/2020   Abnormality of gait 05/09/2020   Hemiplegia of nondominant side, late effect of cerebrovascular disease (HCC) 03/28/2020   Pleural effusion 03/01/2020   Sleep disturbance 02/06/2020   Spastic hemiplegia of left nondominant side due to nontraumatic intraparenchymal hemorrhage of brain (HCC) 12/26/2019   Trigger thumb of right hand 11/02/2019   OSA (obstructive sleep apnea) 07/26/2019   Spastic hemiplegia of left nondominant side as late effect of nontraumatic intraparenchymal hemorrhage of brain (HCC) 04/06/2019   Spastic hemiparesis affecting dominant side (HCC) 01/04/2018   Hyperglycemia 12/29/2016   Depression 12/29/2016   Muscle spasticity    Class 1 obesity with serious comorbidity and body mass index (BMI) of  33.0 to 33.9 in adult 04/08/2016   Hyperlipidemia 04/08/2016   Basal ganglia hemorrhage ([place]) 04/08/2016   Dysphagia as late effect of cerebrovascular disease    Migraine with aura and without status migrainosus, not intractable    Gait disturbance, post-stroke    Hemiplegia, post-stroke (HCC)    Aphasia, post-stroke    Dysarthria, post-stroke    ICH (intracerebral hemorrhage) ([place]) - R basal ganglia due to hypertensive emergency 04/01/2016   Upper airway cough syndrome 12/06/2015   PCP NOTES >>>>>>>>>>>>>>>>>>>>>>>>>>>.. 09/25/2015   Insomnia 12/28/2012   Annual physical exam 11/23/2011   Essential hypertension 10/19/2007   Ann Lions PT, DPT, PN2   Supplemental Physical Therapist [facility]    Pager [phone] Acute Rehab Office [place]. [place], [place], [address] Phone: [phone]   Fax:  [phone]  Name: Abigail Campos MRN: [id number] Date of Birth: Sep 06, 1971

## 2021-12-11 ENCOUNTER — Other Ambulatory Visit: Payer: Self-pay | Admitting: Physical Medicine & Rehabilitation

## 2021-12-11 ENCOUNTER — Other Ambulatory Visit: Payer: Self-pay | Admitting: Internal Medicine

## 2021-12-16 ENCOUNTER — Other Ambulatory Visit: Payer: Self-pay

## 2021-12-16 ENCOUNTER — Encounter: Payer: Self-pay | Admitting: Physical Therapy

## 2021-12-16 ENCOUNTER — Ambulatory Visit
Payer: No Typology Code available for payment source | Attending: Physical Medicine & Rehabilitation | Admitting: Physical Therapy

## 2021-12-16 DIAGNOSIS — R269 Unspecified abnormalities of gait and mobility: Secondary | ICD-10-CM | POA: Insufficient documentation

## 2021-12-16 DIAGNOSIS — Z9181 History of falling: Secondary | ICD-10-CM | POA: Insufficient documentation

## 2021-12-16 DIAGNOSIS — M6281 Muscle weakness (generalized): Secondary | ICD-10-CM | POA: Insufficient documentation

## 2021-12-16 DIAGNOSIS — R29818 Other symptoms and signs involving the nervous system: Secondary | ICD-10-CM | POA: Diagnosis not present

## 2021-12-16 DIAGNOSIS — I69352 Hemiplegia and hemiparesis following cerebral infarction affecting left dominant side: Secondary | ICD-10-CM | POA: Insufficient documentation

## 2021-12-16 NOTE — Therapy (Signed)
[place]. [place], [place], [address] Phone: [phone]   Fax:  [phone]  Physical Therapy Treatment  Patient Details  Name: Abigail Campos MRN: [id number] Date of Birth: 14-Jun-1971 Referring Provider (PT): Dr Letta Pate   Encounter Date: 12/16/2021   PT End of Session - 12/16/21 1208     Visit Number 9    Number of Visits 17    Date for PT Re-Evaluation 12/24/21    Authorization Type [employer]na Medicare    Progress Note Due on Visit 10    PT Start Time 1023   arrived a few minutes late   PT Stop Time 1100    PT Time Calculation (min) 37 min    Equipment Utilized During Treatment Gait belt    Activity Tolerance Patient tolerated treatment well    Behavior During Therapy [facility] for tasks assessed/performed             Past Medical History:  Diagnosis Date   Anemia    Anxiety and depression    Back pain    Essential hypertension 10/19/2007   03-2010: metoprolol changed to bystolic (was not feeling well on it, no specific allergy or reaction)     Food allergy    Hemiplegia ([place])    Hyperlipemia    Hypertension    Joint pain    Migraine    Neuromuscular disorder ([place])    left sided hemiplegia - has brace for l leg and walks with a cane   Obesity    Ovarian cancer ([place])    Prediabetes    Sleep apnea    not wearing c-pap yet   Sleep apnea    Stroke ([place]) 2017   Vitamin D deficiency     Past Surgical History:  Procedure Laterality Date   ABDOMINAL HYSTERECTOMY  11-14-07   no oophorectomy per surgical report   CESAREAN SECTION     [id number]   INGUINAL HERNIA REPAIR     2004    There were no vitals filed for this visit.   Subjective Assessment - 12/16/21 1026     Subjective Had a good new years. Not feeling great this morning, wondering if BP may be up. Arm is doing OK, hurting  a little but its cooler today and that usually tends to affect my pain    Pertinent History PMH: HTN, hx of CVA  (2017), ovarian cancer)    Patient Stated Goals Stand longer and better, [facility] wihtout a cane, but wearing brace.    Currently in Pain? Yes    Pain Score 3     Pain Location Arm    Pain Orientation Left    Pain Descriptors / Indicators Dull                [facility] PT Assessment - 12/16/21 0001       Observation/Other Assessments   Observations BP 128/94 HR 73                           [facility] Adult PT Treatment/Exercise - 12/16/21 0001       Knee/Hip Exercises: Standing   Forward Step Up Left;1 set;10 reps    Forward Step Up Limitations 4 inch step U UE support when stepping with L LE    Other Standing Knee Exercises side steps up stairs 5x3 U UE support L LE leading    Other Standing Knee Exercises side stepping 45ft  x2, backwards walking 31ft x2; cone taps with cone on 4 inch step/stance L LE 1x10 and cone taps with cone on floor R LE stance tapping L LE      Knee/Hip Exercises: Seated   Sit to Sand 5 reps;2 sets   R LE up on 4 inch step, pushing up with L LE                    PT Education - 12/16/21 1208     Education Details exercise from and purpose    Person(s) Educated Patient    Methods Explanation    Comprehension Verbalized understanding              PT Short Term Goals - 11/25/21 1325       PT SHORT TERM GOAL #1   Title Patient will be I with basic HEP    Time 1    Period Weeks    Status Achieved    Target Date 11/26/21               PT Long Term Goals - 11/17/21 1414       PT LONG TERM GOAL #1   Title Patient to be independent with final HEP for improved functional mobility, decreased fall risk.    Time 6    Period Weeks    Status On-going      PT LONG TERM GOAL #2   Title Decrease time for 5 x sit to stand test to < 20 seconds.    Baseline 23.87    Time 8    Period Weeks    Status New      PT LONG TERM GOAL #3   Title Pt will decr TUG time to 20 seconds or less with SPC with quad tip in order to  demo decr fall risk.    Baseline 24.70    Time 8    Period Weeks    Status New      PT LONG TERM GOAL #4   Title Pt will ambulate at least 300' over indoor level and outdoor unlevel surfaces with LRAD with supervision in order to demo improved community mobility.    Baseline 100' with cane on level surfaces iwth multiple gait deviations.    Time 8    Period Weeks    Status New                   Plan - 12/16/21 1210     Clinical Impression Statement [name] arrives a few minutes late today, tells me she is just having an off day, woke up late; asks me to check BP which was slightly elevated but WNL. Continued working on functional strengthening and reciprocal tasks for L LE. Seems a bit more anxious than usual today but did OK during todays session. Will continue to progress as able.    Personal Factors and Comorbidities Comorbidity 3+;Time since onset of injury/illness/exacerbation;Fitness;Past/Current Experience    Comorbidities HTN, hx of CVA (2017), ovarian cancer    Examination-Activity Limitations Locomotion Level;Stand;Transfers;Caring for Others    Examination-Participation Restrictions Community Activity    Stability/Clinical Decision Making Stable/Uncomplicated    Clinical Decision Making Low    Rehab Potential Good    PT Frequency 2x / week    PT Duration --   7 w   PT Treatment/Interventions ADLs/Self Care Home Management;Aquatic Therapy;DME Instruction;Gait training;Functional mobility training;Therapeutic activities;Stair training;Therapeutic exercise;Balance training;Neuromuscular re-education;Orthotic Fit/Training;Patient/family education;Passive range of motion;Dry needling;Manual techniques;Iontophoresis 4mg /ml Dexamethasone;Electrical  Stimulation    PT Next Visit Plan reassess, discuss core strength/toning per pt request    PT Home Exercise Plan Access Code: [email or web address]    Consulted and Agree with Plan of Care Patient             Patient will benefit from  skilled therapeutic intervention in order to improve the following deficits and impairments:  Abnormal gait, Decreased coordination, Decreased range of motion, Difficulty walking, Increased fascial restricitons, Impaired tone, Decreased endurance, Increased muscle spasms, Decreased activity tolerance, Pain, Decreased balance, Impaired flexibility, Improper body mechanics, Decreased mobility, Decreased strength, Impaired sensation, Postural dysfunction  Visit Diagnosis: Muscle weakness (generalized)  Other symptoms and signs involving the nervous system  Abnormality of gait     Problem List Patient Active Problem List   Diagnosis Date Noted   Nausea without vomiting 07/23/2021   Vitamin D deficiency 07/06/2021   History of CVA (cerebrovascular accident) 07/06/2021   Other constipation 03/18/2021   Hot flashes 03/17/2021   Prediabetes 02/11/2021   COVID-19 21/22/4825   Metabolic syndrome 00/37/0488   Neuropathic pain 09/19/2020   Adjustment disorder with mixed anxiety and depressed mood 09/19/2020   Facial droop 08/30/2020   Abnormality of gait 05/09/2020   Hemiplegia of nondominant side, late effect of cerebrovascular disease ([place]) 03/28/2020   Pleural effusion 03/01/2020   Sleep disturbance 02/06/2020   Spastic hemiplegia of left nondominant side due to nontraumatic intraparenchymal hemorrhage of brain ([place]) 12/26/2019   Trigger thumb of right hand 11/02/2019   OSA (obstructive sleep apnea) 07/26/2019   Spastic hemiplegia of left nondominant side as late effect of nontraumatic intraparenchymal hemorrhage of brain (HCC) 04/06/2019   Spastic hemiparesis affecting dominant side ([place]) 01/04/2018   Hyperglycemia 12/29/2016   Depression 12/29/2016   Muscle spasticity    Class 1 obesity with serious comorbidity and body mass index (BMI) of 33.0 to 33.9 in adult 04/08/2016   Hyperlipidemia 04/08/2016   Basal ganglia hemorrhage ([place]) 04/08/2016   Dysphagia as late effect of  cerebrovascular disease    Migraine with aura and without status migrainosus, not intractable    Gait disturbance, post-stroke    Hemiplegia, post-stroke (HCC)    Aphasia, post-stroke    Dysarthria, post-stroke    ICH (intracerebral hemorrhage) ([place]) - R basal ganglia due to hypertensive emergency 04/01/2016   Upper airway cough syndrome 12/06/2015   PCP NOTES >>>>>>>>>>>>>>>>>>>>>>>>>>>.. 09/25/2015   Insomnia 12/28/2012   Annual physical exam 11/23/2011   Essential hypertension 10/19/2007   Ann Lions PT, DPT, PN2   Supplemental Physical Therapist [facility]    Pager [phone] Acute Rehab Office [place]. [place], [place], [address] Phone: [phone]   Fax:  [phone]  Name: SINIYAH EVANGELIST MRN: [id number] Date of Birth: 25-Sep-1971

## 2021-12-18 ENCOUNTER — Other Ambulatory Visit: Payer: Self-pay

## 2021-12-18 ENCOUNTER — Ambulatory Visit: Payer: No Typology Code available for payment source | Admitting: Physical Therapy

## 2021-12-18 ENCOUNTER — Encounter: Payer: Self-pay | Admitting: Physical Therapy

## 2021-12-18 DIAGNOSIS — R269 Unspecified abnormalities of gait and mobility: Secondary | ICD-10-CM

## 2021-12-18 DIAGNOSIS — M6281 Muscle weakness (generalized): Secondary | ICD-10-CM

## 2021-12-18 DIAGNOSIS — R29818 Other symptoms and signs involving the nervous system: Secondary | ICD-10-CM

## 2021-12-18 DIAGNOSIS — I69352 Hemiplegia and hemiparesis following cerebral infarction affecting left dominant side: Secondary | ICD-10-CM | POA: Diagnosis not present

## 2021-12-18 DIAGNOSIS — Z9181 History of falling: Secondary | ICD-10-CM | POA: Diagnosis not present

## 2021-12-18 NOTE — Therapy (Signed)
[place] [place], [place], [address] Phone: [phone]   Fax:  [phone]  Physical Therapy Treatment  Patient Details  Name: Abigail Campos MRN: [id number] Date of Birth: 12/23/1970 Referring Provider (PT): Dr Letta Pate   Encounter Date: 12/18/2021  Progress Note Reporting Period 10/29/21 to 12/18/21  See note below for Objective Data and Assessment of Progress/Goals.      PT End of Session - 12/18/21 1751     Visit Number 10    Number of Visits 22    Date for PT Re-Evaluation 01/29/22    Authorization Type [employer]    Progress Note Due on Visit 20    PT Start Time 1652    PT Stop Time 1735    PT Time Calculation (min) 43 min    Equipment Utilized During Treatment Gait belt    Activity Tolerance Patient tolerated treatment well    Behavior During Therapy WFL for tasks assessed/performed             Past Medical History:  Diagnosis Date   Anemia    Anxiety and depression    Back pain    Essential hypertension 10/19/2007   03-2010: metoprolol changed to bystolic (was not feeling well on it, no specific allergy or reaction)     Food allergy    Hemiplegia (HCC)    Hyperlipemia    Hypertension    Joint pain    Migraine    Neuromuscular disorder ([place])    left sided hemiplegia - has brace for l leg and walks with a cane   Obesity    Ovarian cancer ([place])    Prediabetes    Sleep apnea    not wearing c-pap yet   Sleep apnea    Stroke ([place]) 2017   Vitamin D deficiency     Past Surgical History:  Procedure Laterality Date   ABDOMINAL HYSTERECTOMY  11-14-07   no oophorectomy per surgical report   CESAREAN SECTION     [id number]   INGUINAL HERNIA REPAIR     2004    There were no vitals filed for this visit.   Subjective Assessment - 12/18/21 1654     Subjective I was able to get my headscarf on by myself today! I feel like I'm making progress coming to therapy- walking is  going better, I feel like my strength coming along but its not where I'd like it to be yet. Had those falls around thanksgiving and christmas, no more falls since but I have had some close calls. I do feel that I need more therapy, don't feel like I'm ready to DC yet.    Pertinent History PMH: HTN, hx of CVA (2017), ovarian cancer)    Limitations Walking;Standing    How long can you sit comfortably? 1/5- depends if my hip is hurting need to stop every 2 hours, if I'm not having hip pain can go as long as 4 hours    How long can you stand comfortably? 1/5- 30 minutes    How long can you walk comfortably? 1/5- still at about 3000 steps per day    Patient Stated Goals Stand longer and better, [facility] wihtout a cane, but wearing brace.    Currently in Pain? No/denies                [facility] PT Assessment - 12/18/21 0001       Assessment   Medical Diagnosis CVA with L  sided Weakness    Referring Provider (PT) Dr Letta Pate    Onset Date/Surgical Date 08/30/20    Hand Dominance Left    Next MD Visit Dec 19 2021    Prior Therapy Extensive rehab at the time of CVA      Precautions   Precautions Fall    Other Brace/Splint L AFO, wraps around front of shin.      Balance Screen   Has the patient fallen in the past 6 months Yes    How many times? 3    Has the patient had a decrease in activity level because of a fear of falling?  No    Is the patient reluctant to leave their home because of a fear of falling?  No      Home [employer] residence      Prior Function   Level of Independence Independent    Leisure Arts and crafts, cooking, walking      Strength   Right Hip Flexion 4+/5    Right Hip ABduction 4/5   seated hip ABD   Left Hip Flexion 3-/5    Left Hip ABduction 3/5   seated hip ABD   Right Knee Flexion 5/5    Right Knee Extension 5/5    Left Knee Flexion 3-/5    Left Knee Extension 4+/5    Right Ankle Dorsiflexion 5/5    Left Ankle Dorsiflexion  1/5    Left Ankle Plantar Flexion 1/5    Left Ankle Inversion 0/5    Left Ankle Eversion 0/5      Transfers   Five time sit to stand comments  21      Berg Balance Test   Sit to Stand Able to stand without using hands and stabilize independently    Standing Unsupported Able to stand safely 2 minutes    Sitting with Back Unsupported but Feet Supported on Floor or Stool Able to sit safely and securely 2 minutes    Stand to Sit Sits safely with minimal use of hands    Transfers Able to transfer safely, minor use of hands    Standing Unsupported with Eyes Closed Able to stand 10 seconds safely    Standing Unsupported with Feet Together Able to place feet together independently and stand 1 minute safely    From Standing, Reach Forward with Outstretched Arm Can reach forward >12 cm safely (5")    From Standing Position, Pick up Object from Floor Able to pick up shoe safely and easily    From Standing Position, Turn to Look Behind Over each Shoulder Looks behind one side only/other side shows less weight shift    Turn 360 Degrees Needs close supervision or verbal cueing    Standing Unsupported, Alternately Place Feet on Step/Stool Needs assistance to keep from falling or unable to try    Standing Unsupported, One Foot in Front Able to take small step independently and hold 30 seconds    Standing on One Leg Tries to lift leg/unable to hold 3 seconds but remains standing independently    Total Score 42      Timed Up and Go Test   Normal TUG (seconds) 19.6                           [facility] Adult PT Treatment/Exercise - 12/18/21 0001       Knee/Hip Exercises: Seated   Other Seated Knee/Hip Exercises  on physiodisc- seated marches 1x10 wtih TA set, mini-crunches 1x10 with posterior lean                     PT Education - 12/18/21 1750     Education Details reassess findings, POC moving forward, HEP updates    Person(s) Educated Patient    Methods Explanation     Comprehension Verbalized understanding              PT Short Term Goals - 12/18/21 1721       PT SHORT TERM GOAL #1   Title Patient will be I with basic HEP    Time 1    Period Weeks    Status Achieved               PT Long Term Goals - 12/18/21 1721       PT LONG TERM GOAL #1   Title Patient to be independent with final HEP for improved functional mobility, decreased fall risk.    Time 6    Period Weeks    Status On-going      PT LONG TERM GOAL #2   Title Decrease time for 5 x sit to stand test to < 20 seconds.    Baseline 1/5- 21    Time 8    Period Weeks    Status On-going      PT LONG TERM GOAL #3   Title Pt will decr TUG time to 20 seconds or less with SPC with quad tip in order to demo decr fall risk.    Baseline 1/5- 19    Time 8    Period Weeks    Status Achieved      PT LONG TERM GOAL #4   Title Pt will ambulate at least 300' over indoor level and outdoor unlevel surfaces with LRAD with supervision in order to demo improved community mobility.    Baseline 1/5- has been able to walk over gravel with cane about 24ft; even surfaces no limits with cane    Time 8    Period Weeks    Status On-going    Target Date 12/24/21                   Plan - 12/18/21 1751     Clinical Impression Statement Bridey arrives today doing well- excited that she was able to get her headscarf on by herself today! Performed re-assessment per insurance requirements- definitely showing improvement in functional strength, mobility, and balance skills as evidenced by MMT, TUG, and 5x sit to stand testing. Does demonstrate ongoing significant gait impairment as well as poor proprioception, motor control, and mm endurance L LE.  Very motivated to continue working with therapy and really wants to continue to improve- I do think she has capacity to do so. Discussed POC, she also has some concerns about core strength (hx of hernia surgery as well as multiple C-sections  complicating the case) which we worked on a bit this session. Will continue to progress as tolerated.    Personal Factors and Comorbidities Comorbidity 3+;Time since onset of injury/illness/exacerbation;Fitness;Past/Current Experience    Comorbidities HTN, hx of CVA (2017), ovarian cancer    Examination-Activity Limitations Locomotion Level;Stand;Transfers;Caring for Others    Examination-Participation Restrictions Community Activity    Stability/Clinical Decision Making Stable/Uncomplicated    Clinical Decision Making Low    Rehab Potential Good    PT Frequency 2x / week    PT Duration 6 weeks  PT Treatment/Interventions ADLs/Self Care Home Management;Aquatic Therapy;DME Instruction;Gait training;Functional mobility training;Therapeutic activities;Stair training;Therapeutic exercise;Balance training;Neuromuscular re-education;Orthotic Fit/Training;Patient/family education;Passive range of motion;Dry needling;Manual techniques;Iontophoresis 4mg /ml Dexamethasone;Electrical Stimulation    PT Next Visit Plan continue working on gait training, motor control and strength R LE. Work in core strength as well per pt request. Update LE HEP    PT Home Exercise Plan Access Code: [email or web address]    RCVELFYBO and Agree with Plan of Care Patient             Patient will benefit from skilled therapeutic intervention in order to improve the following deficits and impairments:  Abnormal gait, Decreased coordination, Decreased range of motion, Difficulty walking, Increased fascial restricitons, Impaired tone, Decreased endurance, Increased muscle spasms, Decreased activity tolerance, Pain, Decreased balance, Impaired flexibility, Improper body mechanics, Decreased mobility, Decreased strength, Impaired sensation, Postural dysfunction  Visit Diagnosis: Muscle weakness (generalized)  Other symptoms and signs involving the nervous system  Abnormality of gait     Problem List Patient Active Problem List    Diagnosis Date Noted   Nausea without vomiting 07/23/2021   Vitamin D deficiency 07/06/2021   History of CVA (cerebrovascular accident) 07/06/2021   Other constipation 03/18/2021   Hot flashes 03/17/2021   Prediabetes 02/11/2021   COVID-19 17/51/0258   Metabolic syndrome 52/77/8242   Neuropathic pain 09/19/2020   Adjustment disorder with mixed anxiety and depressed mood 09/19/2020   Facial droop 08/30/2020   Abnormality of gait 05/09/2020   Hemiplegia of nondominant side, late effect of cerebrovascular disease ([place]) 03/28/2020   Pleural effusion 03/01/2020   Sleep disturbance 02/06/2020   Spastic hemiplegia of left nondominant side due to nontraumatic intraparenchymal hemorrhage of brain ([place]) 12/26/2019   Trigger thumb of right hand 11/02/2019   OSA (obstructive sleep apnea) 07/26/2019   Spastic hemiplegia of left nondominant side as late effect of nontraumatic intraparenchymal hemorrhage of brain (HCC) 04/06/2019   Spastic hemiparesis affecting dominant side ([place]) 01/04/2018   Hyperglycemia 12/29/2016   Depression 12/29/2016   Muscle spasticity    Class 1 obesity with serious comorbidity and body mass index (BMI) of 33.0 to 33.9 in adult 04/08/2016   Hyperlipidemia 04/08/2016   Basal ganglia hemorrhage ([place]) 04/08/2016   Dysphagia as late effect of cerebrovascular disease    Migraine with aura and without status migrainosus, not intractable    Gait disturbance, post-stroke    Hemiplegia, post-stroke (HCC)    Aphasia, post-stroke    Dysarthria, post-stroke    ICH (intracerebral hemorrhage) ([place]) - R basal ganglia due to hypertensive emergency 04/01/2016   Upper airway cough syndrome 12/06/2015   PCP NOTES >>>>>>>>>>>>>>>>>>>>>>>>>>>.. 09/25/2015   Insomnia 12/28/2012   Annual physical exam 11/23/2011   Essential hypertension 10/19/2007   Ann Lions PT, DPT, PN2   Supplemental Physical Therapist [facility]    Pager [phone] Acute Rehab Office [place]. [place], [place], [address] Phone: [phone]   Fax:  [phone]  Name: Abigail Campos MRN: [id number] Date of Birth: 02/22/1971

## 2021-12-19 ENCOUNTER — Encounter: Payer: Self-pay | Admitting: Physical Medicine & Rehabilitation

## 2021-12-19 ENCOUNTER — Encounter
Payer: No Typology Code available for payment source | Attending: Physical Medicine & Rehabilitation | Admitting: Physical Medicine & Rehabilitation

## 2021-12-19 VITALS — BP 139/95 | HR 79 | Temp 99.4°F | Ht 59.0 in | Wt 147.0 lb

## 2021-12-19 DIAGNOSIS — G8114 Spastic hemiplegia affecting left nondominant side: Secondary | ICD-10-CM | POA: Insufficient documentation

## 2021-12-19 NOTE — Patient Instructions (Signed)
You received a Dysport injection today. You may experience muscle pains and aches. He may apply ice 20 minutes every 2 hours as needed for the next 24-48 hours. ?He also noticed bleeding or bruising in the areas that were injected. May apply Band-Aid. If this bruising is extensive, please notify our office. If there is evidence of increasing redness that occurs 2-3 days after injection. Please call our office. This could be a sign of infection. It is very rare, however. ?You may experience some muscle weakness in the muscles and injected. This would likely start in about one week.  ? ?Tibial nerve block with phenol today. ?This medication may start taking the fact today however full effect will be at about one week ?Duration of the effect is 3-6 months ?Side effects of medication may include right heel numbness or burning. ?Call if you have burning pain so we can recommend any medication for that.  ?

## 2021-12-19 NOTE — Progress Notes (Signed)
Dysport Injection for spasticity using needle EMG guidance  Dilution: 200 Units/ml Indication: Severe spasticity which interferes with ADL,mobility and/or  hygiene and is unresponsive to medication management and other conservative care Informed consent was obtained after describing risks and benefits of the procedure with the patient. This includes bleeding, bruising, infection, excessive weakness, or medication side effects. A REMS form is on file and signed. Needle:  needle electrode Number of units per muscle Left FCR: 200 units  Left FDS: 400 units Left FDP: 300units Waste 100u  All injections were done after obtaining appropriate EMG activity and after negative drawback for blood. The patient tolerated the procedure well. Post procedure instructions were given. A followup appointment was made.

## 2021-12-19 NOTE — Progress Notes (Signed)
Phenol neurolysis of the LEFT tibial nerve,   Indication: Severe spasticity in the plantar flexor muscles which is not responding to medical management and other conservative care and interfering with functional use.  Informed consent was obtained after describing the risks and benefits of the procedure with the patient this includes bleeding bruising and infection as well as medication side effects. The patient elected to proceed and has given written consent.In seated position Left FPL identified with estim .5ml 5% phenol injected with 22 g needle electrode  Patient placed in a prone position on the exam table. External DC stimulation was applied to the popliteal space using a nerve stimulator. Plantar flexion twitch was obtained. The popliteal region was prepped with Betadine and then entered with a 22-gauge 40 mm needle electrode under electrical stimulation guidance. Plantar flexion which was obtained and confirmed. Then 4 cc of 5% phenol were injected. The patient tolerated procedure well. Post procedure instructions and followup visit were given.  

## 2021-12-23 ENCOUNTER — Encounter: Payer: Self-pay | Admitting: Physical Therapy

## 2021-12-23 ENCOUNTER — Ambulatory Visit: Payer: No Typology Code available for payment source | Admitting: Physical Therapy

## 2021-12-23 DIAGNOSIS — Z9181 History of falling: Secondary | ICD-10-CM

## 2021-12-23 DIAGNOSIS — R269 Unspecified abnormalities of gait and mobility: Secondary | ICD-10-CM | POA: Diagnosis not present

## 2021-12-23 DIAGNOSIS — M6281 Muscle weakness (generalized): Secondary | ICD-10-CM | POA: Diagnosis not present

## 2021-12-23 DIAGNOSIS — R29818 Other symptoms and signs involving the nervous system: Secondary | ICD-10-CM

## 2021-12-23 DIAGNOSIS — I69352 Hemiplegia and hemiparesis following cerebral infarction affecting left dominant side: Secondary | ICD-10-CM

## 2021-12-23 NOTE — Therapy (Signed)
[address]. [place], [place], [address] Phone: [phone]   Fax:  [phone]  Physical Therapy Treatment  Patient Details  Name: Abigail Campos MRN: [id number] Date of Birth: 1971-03-02 Referring Provider (PT): Dr Letta Pate   Encounter Date: 12/23/2021   PT End of Session - 12/23/21 1426     Visit Number 11    Date for PT Re-Evaluation 01/29/22    Authorization Type [employer]na Medicare    PT Start Time 0623    PT Stop Time 7628    PT Time Calculation (min) 43 min    Activity Tolerance Patient tolerated treatment well    Behavior During Therapy [facility] for tasks assessed/performed             Past Medical History:  Diagnosis Date   Anemia    Anxiety and depression    Back pain    Essential hypertension 10/19/2007   03-2010: metoprolol changed to bystolic (was not feeling well on it, no specific allergy or reaction)     Food allergy    Hemiplegia ([place])    Hyperlipemia    Hypertension    Joint pain    Migraine    Neuromuscular disorder ([place])    left sided hemiplegia - has brace for l leg and walks with a cane   Obesity    Ovarian cancer ([place])    Prediabetes    Sleep apnea    not wearing c-pap yet   Sleep apnea    Stroke ([place]) 2017   Vitamin D deficiency     Past Surgical History:  Procedure Laterality Date   ABDOMINAL HYSTERECTOMY  11-14-07   no oophorectomy per surgical report   CESAREAN SECTION     [id number]   INGUINAL HERNIA REPAIR     2004    There were no vitals filed for this visit.   Subjective Assessment - 12/23/21 1352     Subjective "I am feeling fine just tired"    Currently in Pain? No/denies                               [facility] Adult PT Treatment/Exercise - 12/23/21 0001       Ambulation/Gait   Stairs Yes    Stairs Assistance 5: Supervision    Stair Management Technique One rail Right;Alternating pattern;Step to pattern    Number of Stairs 6     Height of Stairs 4      High Level Balance   High Level Balance Activities Side stepping;Backward walking      Knee/Hip Exercises: Aerobic   Nustep L5 x 6 min      Knee/Hip Exercises: Standing   Forward Step Up Left;1 set;10 reps;Hand Hold: 1;Step Height: 4"      Knee/Hip Exercises: Seated   Long Arc Quad Strengthening;Left;10 reps;3 sets    Long Arc Quad Weight 2 lbs.    Ball Squeeze 2x10   PTA blocking RLE   Other Seated Knee/Hip Exercises fitter press 1 black 2x10    Other Seated Knee/Hip Exercises LLE abd/add foot over dowel    Sit to Sand 1 set;10 reps;with UE support                       PT Short Term Goals - 12/18/21 1721       PT SHORT TERM GOAL #1   Title Patient will be I  with basic HEP    Time 1    Period Weeks    Status Achieved               PT Long Term Goals - 12/18/21 1721       PT LONG TERM GOAL #1   Title Patient to be independent with final HEP for improved functional mobility, decreased fall risk.    Time 6    Period Weeks    Status On-going      PT LONG TERM GOAL #2   Title Decrease time for 5 x sit to stand test to < 20 seconds.    Baseline 1/5- 21    Time 8    Period Weeks    Status On-going      PT LONG TERM GOAL #3   Title Pt will decr TUG time to 20 seconds or less with SPC with quad tip in order to demo decr fall risk.    Baseline 1/5- 19    Time 8    Period Weeks    Status Achieved      PT LONG TERM GOAL #4   Title Pt will ambulate at least 300' over indoor level and outdoor unlevel surfaces with LRAD with supervision in order to demo improved community mobility.    Baseline 1/5- has been able to walk over gravel with cane about 69ft; even surfaces no limits with cane    Time 8    Period Weeks    Status On-going    Target Date 12/24/21                   Plan - 12/23/21 1426     Clinical Impression Statement Pt did really well in today's session giving good effort. Continues with functional  strengthening and mobility as well as some LLE isolation strengthening. LLE assist needed to keep it abducted with fitter press interventions. Pt has difficult time with LLE seated abduction and ball squeezes. Cue needed for LE placement with sit to stands to lessen compensation.    Personal Factors and Comorbidities Comorbidity 3+;Time since onset of injury/illness/exacerbation;Fitness;Past/Current Experience    Examination-Activity Limitations Locomotion Level;Stand;Transfers;Caring for Others    Examination-Participation Restrictions Community Activity    Stability/Clinical Decision Making Stable/Uncomplicated    Rehab Potential Good    PT Frequency 2x / week    PT Duration 6 weeks    PT Treatment/Interventions ADLs/Self Care Home Management;Aquatic Therapy;DME Instruction;Gait training;Functional mobility training;Therapeutic activities;Stair training;Therapeutic exercise;Balance training;Neuromuscular re-education;Orthotic Fit/Training;Patient/family education;Passive range of motion;Dry needling;Manual techniques;Iontophoresis 4mg /ml Dexamethasone;Electrical Stimulation    PT Next Visit Plan continue working on gait training, motor control and strength R LE. Work in core strength as well per pt request.             Patient will benefit from skilled therapeutic intervention in order to improve the following deficits and impairments:  Abnormal gait, Decreased coordination, Decreased range of motion, Difficulty walking, Increased fascial restricitons, Impaired tone, Decreased endurance, Increased muscle spasms, Decreased activity tolerance, Pain, Decreased balance, Impaired flexibility, Improper body mechanics, Decreased mobility, Decreased strength, Impaired sensation, Postural dysfunction  Visit Diagnosis: Muscle weakness (generalized)  Abnormality of gait  History of falling  Other symptoms and signs involving the nervous system  Hemiplegia and hemiparesis following cerebral  infarction affecting left dominant side [facility])     Problem List Patient Active Problem List   Diagnosis Date Noted   Nausea without vomiting 07/23/2021   Vitamin D deficiency 07/06/2021   History  of CVA (cerebrovascular accident) 07/06/2021   Other constipation 03/18/2021   Hot flashes 03/17/2021   Prediabetes 02/11/2021   COVID-19 92/10/9416   Metabolic syndrome 40/81/4481   Neuropathic pain 09/19/2020   Adjustment disorder with mixed anxiety and depressed mood 09/19/2020   Facial droop 08/30/2020   Abnormality of gait 05/09/2020   Hemiplegia of nondominant side, late effect of cerebrovascular disease ([place]) 03/28/2020   Pleural effusion 03/01/2020   Sleep disturbance 02/06/2020   Spastic hemiplegia of left nondominant side due to nontraumatic intraparenchymal hemorrhage of brain ([place]) 12/26/2019   Trigger thumb of right hand 11/02/2019   OSA (obstructive sleep apnea) 07/26/2019   Spastic hemiplegia of left nondominant side as late effect of nontraumatic intraparenchymal hemorrhage of brain ([place]) 04/06/2019   Spastic hemiparesis affecting dominant side ([place]) 01/04/2018   Hyperglycemia 12/29/2016   Depression 12/29/2016   Muscle spasticity    Class 1 obesity with serious comorbidity and body mass index (BMI) of 33.0 to 33.9 in adult 04/08/2016   Hyperlipidemia 04/08/2016   Basal ganglia hemorrhage ([place]) 04/08/2016   Dysphagia as late effect of cerebrovascular disease    Migraine with aura and without status migrainosus, not intractable    Gait disturbance, post-stroke    Hemiplegia, post-stroke (HCC)    Aphasia, post-stroke    Dysarthria, post-stroke    ICH (intracerebral hemorrhage) ([place]) - R basal ganglia due to hypertensive emergency 04/01/2016   Upper airway cough syndrome 12/06/2015   PCP NOTES >>>>>>>>>>>>>>>>>>>>>>>>>>>.. 09/25/2015   Insomnia 12/28/2012   Annual physical exam 11/23/2011   Essential hypertension 10/19/2007    Scot Jun, PTA 12/23/2021,  2:32 PM  [address]. [place], [place], [address] Phone: [phone]   Fax:  [phone]  Name: Abigail Campos MRN: [id number] Date of Birth: 04-27-1971

## 2021-12-25 ENCOUNTER — Encounter: Payer: Self-pay | Admitting: Physical Therapy

## 2021-12-25 ENCOUNTER — Other Ambulatory Visit: Payer: Self-pay

## 2021-12-25 ENCOUNTER — Ambulatory Visit: Payer: No Typology Code available for payment source | Admitting: Physical Therapy

## 2021-12-25 DIAGNOSIS — R269 Unspecified abnormalities of gait and mobility: Secondary | ICD-10-CM | POA: Diagnosis not present

## 2021-12-25 DIAGNOSIS — Z9181 History of falling: Secondary | ICD-10-CM

## 2021-12-25 DIAGNOSIS — R29818 Other symptoms and signs involving the nervous system: Secondary | ICD-10-CM | POA: Diagnosis not present

## 2021-12-25 DIAGNOSIS — M6281 Muscle weakness (generalized): Secondary | ICD-10-CM | POA: Diagnosis not present

## 2021-12-25 DIAGNOSIS — I69352 Hemiplegia and hemiparesis following cerebral infarction affecting left dominant side: Secondary | ICD-10-CM | POA: Diagnosis not present

## 2021-12-25 NOTE — Therapy (Signed)
[place]. [place], [place], [address] Phone: [phone]   Fax:  [phone]  Physical Therapy Treatment  Patient Details  Name: Abigail Campos MRN: [id number] Date of Birth: [birth year] Referring Provider (PT): Dr Letta Pate   Encounter Date: 12/25/2021   PT End of Session - 12/25/21 1141     Visit Number 12    Date for PT Re-Evaluation 01/29/22    PT Start Time 1100    PT Stop Time 1143    PT Time Calculation (min) 43 min    Activity Tolerance Patient tolerated treatment well    Behavior During Therapy [facility] for tasks assessed/performed             Past Medical History:  Diagnosis Date   Anemia    Anxiety and depression    Back pain    Essential hypertension 10/19/2007   03-2010: metoprolol changed to bystolic (was not feeling well on it, no specific allergy or reaction)     Food allergy    Hemiplegia ([place])    Hyperlipemia    Hypertension    Joint pain    Migraine    Neuromuscular disorder ([place])    left sided hemiplegia - has brace for l leg and walks with a cane   Obesity    Ovarian cancer ([place])    Prediabetes    Sleep apnea    not wearing c-pap yet   Sleep apnea    Stroke ([place]) 2017   Vitamin D deficiency     Past Surgical History:  Procedure Laterality Date   ABDOMINAL HYSTERECTOMY  11-14-07   no oophorectomy per surgical report   CESAREAN SECTION     [id number]   INGUINAL HERNIA REPAIR     2004    There were no vitals filed for this visit.   Subjective Assessment - 12/25/21 1057     Subjective Pt reports a fall Tuesday at home, her L knee gave out, but she was able to catch herself. L hand scraped cement. No other injuries    Pertinent History PMH: HTN, hx of CVA (2017), ovarian cancer)    Currently in Pain? No/denies                               [facility] Adult PT Treatment/Exercise - 12/25/21 0001       Ambulation/Gait   Stairs Yes    Stairs  Assistance 5: Supervision    Stair Management Technique One rail Right;Alternating pattern;Step to pattern    Number of Stairs 9    Height of Stairs 4      High Level Balance   High Level Balance Activities Backward walking      Knee/Hip Exercises: Aerobic   Nustep L5 x 5 min      Knee/Hip Exercises: Standing   Forward Step Up Both;2 sets;5 reps;Hand Hold: 1;Step Height: 4"   at steps   Other Standing Knee Exercises Marching w/ SPC 2x10      Knee/Hip Exercises: Seated   Long Arc Quad Strengthening;Left;10 reps;3 sets    [employer] Weight 2 lbs.    Other Seated Knee/Hip Exercises fitter press 1 black 2x15    Marching Left;2 sets;10 reps;Weights    Marching Weights 2 lbs.    Sit to Sand 1 set;10 reps;with UE support  PT Short Term Goals - 12/18/21 1721       PT SHORT TERM GOAL #1   Title Patient will be I with basic HEP    Time 1    Period Weeks    Status Achieved               PT Long Term Goals - 12/18/21 1721       PT LONG TERM GOAL #1   Title Patient to be independent with final HEP for improved functional mobility, decreased fall risk.    Time 6    Period Weeks    Status On-going      PT LONG TERM GOAL #2   Title Decrease time for 5 x sit to stand test to < 20 seconds.    Baseline 1/5- 21    Time 8    Period Weeks    Status On-going      PT LONG TERM GOAL #3   Title Pt will decr TUG time to 20 seconds or less with SPC with quad tip in order to demo decr fall risk.    Baseline 1/5- 19    Time 8    Period Weeks    Status Achieved      PT LONG TERM GOAL #4   Title Pt will ambulate at least 300' over indoor level and outdoor unlevel surfaces with LRAD with supervision in order to demo improved community mobility.    Baseline 1/5- has been able to walk over gravel with cane about 22ft; even surfaces no limits with cane    Time 8    Period Weeks    Status On-going    Target Date 12/24/21                    Plan - 12/25/21 1141     Clinical Impression Statement Continues with a focus on functional mobility and LLE strengthening. Cues to prevent circumduction needed with step ups and stair negotiation.Tactile cues to adduct LLE with sit to stands and with seated fitter presses. Decrease bilateral hip flexion with standing march, pt can increase with cues. Some slight L knee buckling without LOB present with backwards walking.    Personal Factors and Comorbidities Comorbidity 3+;Time since onset of injury/illness/exacerbation;Fitness;Past/Current Experience    Comorbidities HTN, hx of CVA (2017), ovarian cancer    Examination-Activity Limitations Locomotion Level;Stand;Transfers;Caring for Others    Examination-Participation Restrictions Community Activity    Stability/Clinical Decision Making Stable/Uncomplicated    Rehab Potential Good    PT Frequency 2x / week    PT Duration 6 weeks    PT Treatment/Interventions ADLs/Self Care Home Management;Aquatic Therapy;DME Instruction;Gait training;Functional mobility training;Therapeutic activities;Stair training;Therapeutic exercise;Balance training;Neuromuscular re-education;Orthotic Fit/Training;Patient/family education;Passive range of motion;Dry needling;Manual techniques;Iontophoresis 4mg /ml Dexamethasone;Electrical Stimulation    PT Next Visit Plan continue working on gait training, motor control and strength R LE. Work in core strength as well per pt request.             Patient will benefit from skilled therapeutic intervention in order to improve the following deficits and impairments:  Abnormal gait, Decreased coordination, Decreased range of motion, Difficulty walking, Increased fascial restricitons, Impaired tone, Decreased endurance, Increased muscle spasms, Decreased activity tolerance, Pain, Decreased balance, Impaired flexibility, Improper body mechanics, Decreased mobility, Decreased strength, Impaired sensation, Postural  dysfunction  Visit Diagnosis: Muscle weakness (generalized)  Abnormality of gait  History of falling  Other symptoms and signs involving the nervous system  Hemiplegia and hemiparesis following cerebral  infarction affecting left dominant side [facility])     Problem List Patient Active Problem List   Diagnosis Date Noted   Nausea without vomiting 07/23/2021   Vitamin D deficiency 07/06/2021   History of CVA (cerebrovascular accident) 07/06/2021   Other constipation 03/18/2021   Hot flashes 03/17/2021   Prediabetes 02/11/2021   COVID-19 15/37/9432   Metabolic syndrome 76/14/7092   Neuropathic pain 09/19/2020   Adjustment disorder with mixed anxiety and depressed mood 09/19/2020   Facial droop 08/30/2020   Abnormality of gait 05/09/2020   Hemiplegia of nondominant side, late effect of cerebrovascular disease ([place]) 03/28/2020   Pleural effusion 03/01/2020   Sleep disturbance 02/06/2020   Spastic hemiplegia of left nondominant side due to nontraumatic intraparenchymal hemorrhage of brain ([place]) 12/26/2019   Trigger thumb of right hand 11/02/2019   OSA (obstructive sleep apnea) 07/26/2019   Spastic hemiplegia of left nondominant side as late effect of nontraumatic intraparenchymal hemorrhage of brain ([place]) 04/06/2019   Spastic hemiparesis affecting dominant side ([place]) 01/04/2018   Hyperglycemia 12/29/2016   Depression 12/29/2016   Muscle spasticity    Class 1 obesity with serious comorbidity and body mass index (BMI) of 33.0 to 33.9 in adult 04/08/2016   Hyperlipidemia 04/08/2016   Basal ganglia hemorrhage ([place]) 04/08/2016   Dysphagia as late effect of cerebrovascular disease    Migraine with aura and without status migrainosus, not intractable    Gait disturbance, post-stroke    Hemiplegia, post-stroke (HCC)    Aphasia, post-stroke    Dysarthria, post-stroke    ICH (intracerebral hemorrhage) ([place]) - R basal ganglia due to hypertensive emergency 04/01/2016   Upper airway cough  syndrome 12/06/2015   PCP NOTES >>>>>>>>>>>>>>>>>>>>>>>>>>>.. 09/25/2015   Insomnia 12/28/2012   Annual physical exam 11/23/2011   Essential hypertension 10/19/2007    Scot Jun, PTA 12/25/2021, 11:44 AM  [place]. [place], [place], [address] Phone: [phone]   Fax:  [phone]  Name: CALLEIGH LAFONTANT MRN: [id number] Date of Birth: October 22, 1971

## 2021-12-29 ENCOUNTER — Encounter (INDEPENDENT_AMBULATORY_CARE_PROVIDER_SITE_OTHER): Payer: Self-pay | Admitting: Family Medicine

## 2021-12-29 DIAGNOSIS — R7303 Prediabetes: Secondary | ICD-10-CM

## 2021-12-29 MED ORDER — TIRZEPATIDE 5 MG/0.5ML ~~LOC~~ SOAJ: 5.0000 mg | SUBCUTANEOUS | 0 refills | Status: DC

## 2021-12-30 ENCOUNTER — Other Ambulatory Visit: Payer: Self-pay

## 2021-12-30 ENCOUNTER — Encounter: Payer: Self-pay | Admitting: Physical Therapy

## 2021-12-30 ENCOUNTER — Ambulatory Visit: Payer: No Typology Code available for payment source | Admitting: Physical Therapy

## 2021-12-30 DIAGNOSIS — Z9181 History of falling: Secondary | ICD-10-CM | POA: Diagnosis not present

## 2021-12-30 DIAGNOSIS — R269 Unspecified abnormalities of gait and mobility: Secondary | ICD-10-CM | POA: Diagnosis not present

## 2021-12-30 DIAGNOSIS — R29818 Other symptoms and signs involving the nervous system: Secondary | ICD-10-CM | POA: Diagnosis not present

## 2021-12-30 DIAGNOSIS — M6281 Muscle weakness (generalized): Secondary | ICD-10-CM | POA: Diagnosis not present

## 2021-12-30 DIAGNOSIS — I69352 Hemiplegia and hemiparesis following cerebral infarction affecting left dominant side: Secondary | ICD-10-CM | POA: Diagnosis not present

## 2021-12-30 NOTE — Therapy (Signed)
[place]. [place], [place], [address] Phone: [phone]   Fax:  [phone]  Physical Therapy Treatment  Patient Details  Name: Abigail Campos MRN: [id number] Date of Birth: 1971/04/28 Referring Provider (PT): Dr Letta Pate   Encounter Date: 12/30/2021   PT End of Session - 12/30/21 1441     Visit Number 13    Number of Visits 22    Date for PT Re-Evaluation 01/29/22    Authorization Type [employer]    Progress Note Due on Visit 20    PT Start Time 1400    PT Stop Time 1440    PT Time Calculation (min) 40 min    Equipment Utilized During Treatment Gait belt    Activity Tolerance Patient tolerated treatment well    Behavior During Therapy [facility] for tasks assessed/performed             Past Medical History:  Diagnosis Date   Anemia    Anxiety and depression    Back pain    Essential hypertension 10/19/2007   03-2010: metoprolol changed to bystolic (was not feeling well on it, no specific allergy or reaction)     Food allergy    Hemiplegia ([place])    Hyperlipemia    Hypertension    Joint pain    Migraine    Neuromuscular disorder ([place])    left sided hemiplegia - has brace for l leg and walks with a cane   Obesity    Ovarian cancer ([place])    Prediabetes    Sleep apnea    not wearing c-pap yet   Sleep apnea    Stroke ([place]) 2017   Vitamin D deficiency     Past Surgical History:  Procedure Laterality Date   ABDOMINAL HYSTERECTOMY  11-14-07   no oophorectomy per surgical report   CESAREAN SECTION     [id number]   INGUINAL HERNIA REPAIR     2004    There were no vitals filed for this visit.   Subjective Assessment - 12/30/21 1403     Subjective I had a fall last week after PT, I think I was just tired. I was able to catch myself somewhat. Nothing new otherwise.    Pertinent History PMH: HTN, hx of CVA (2017), ovarian cancer)    Patient Stated Goals Stand longer and better, [facility] wihtout  a cane, but wearing brace.    Currently in Pain? No/denies   stomach just a bit upset                              [facility] Adult PT Treatment/Exercise - 12/30/21 0001       Knee/Hip Exercises: Standing   Forward Step Up Left;1 set;10 reps    Forward Step Up Limitations 4 inch step    Other Standing Knee Exercises stance on L LE x15 step taps with RLE, progressing to 1x10 double step taps stance L LE; side step-> squat sequence with weight shift over L LE with squats 57ftx2;    Other Standing Knee Exercises sit to stand with R LE on step/L LE in stance 1x10/mod facilitation for weight shift to L; R LE forward flexion and ADD with green TB and LLE in stance avoiding hyper extension/buckling 2x10; mini squat with weight shifted over L LE then cone tap with R LE/step tap L LE;      Knee/Hip Exercises: Seated  Long Arc [employer] Strengthening;Left;1 set;10 reps    [employer] 3 lbs.    Long [employer] Limitations 2 second hold    Marching Left;1 set;10 reps    [employer] 3 lbs.                     PT Education - 12/30/21 1441     Education Details exericse form/purpose    Person(s) Educated Patient    Methods Explanation    Comprehension Verbalized understanding              PT Short Term Goals - 12/18/21 1721       PT SHORT TERM GOAL #1   Title Patient will be I with basic HEP    Time 1    Period Weeks    Status Achieved               PT Long Term Goals - 12/18/21 1721       PT LONG TERM GOAL #1   Title Patient to be independent with final HEP for improved functional mobility, decreased fall risk.    Time 6    Period Weeks    Status On-going      PT LONG TERM GOAL #2   Title Decrease time for 5 x sit to stand test to < 20 seconds.    Baseline 1/5- 21    Time 8    Period Weeks    Status On-going      PT LONG TERM GOAL #3   Title Pt will decr TUG time to 20 seconds or less with SPC with quad tip in order to demo decr  fall risk.    Baseline 1/5- 19    Time 8    Period Weeks    Status Achieved      PT LONG TERM GOAL #4   Title Pt will ambulate at least 300' over indoor level and outdoor unlevel surfaces with LRAD with supervision in order to demo improved community mobility.    Baseline 1/5- has been able to walk over gravel with cane about 56ft; even surfaces no limits with cane    Time 8    Period Weeks    Status On-going    Target Date 12/24/21                   Plan - 12/30/21 1441     Clinical Impression Statement Gordana arrives today doing well, stomach bothering her a little bit today.  Continued working on weight bearing and motor control LLE, as well as weight shifts over L LE during functional standing strengthening tasks. Still tends to rotate L LE out to the side and does need facilitation for good alignment. Aware of how she overshifts to RLE with transfers but still needs multimodal cues to correct. Will continue to progress as able.    Personal Factors and Comorbidities Comorbidity 3+;Time since onset of injury/illness/exacerbation;Fitness;Past/Current Experience    Comorbidities HTN, hx of CVA (2017), ovarian cancer    Examination-Activity Limitations Locomotion Level;Stand;Transfers;Caring for Others    Examination-Participation Restrictions Community Activity    Stability/Clinical Decision Making Stable/Uncomplicated    Clinical Decision Making Low    Rehab Potential Good    PT Frequency 2x / week    PT Duration 6 weeks    PT Treatment/Interventions ADLs/Self Care Home Management;Aquatic Therapy;DME Instruction;Gait training;Functional mobility training;Therapeutic activities;Stair training;Therapeutic exercise;Balance training;Neuromuscular re-education;Orthotic Fit/Training;Patient/family education;Passive range of motion;Dry needling;Manual techniques;Iontophoresis 4mg /ml Dexamethasone;Electrical  Stimulation    PT Next Visit Plan continue working on gait training, motor  control and strength R LE. Work in core strength as well per pt request.    PT Home Exercise Plan Access Code: [email or web address]    Consulted and Agree with Plan of Care Patient             Patient will benefit from skilled therapeutic intervention in order to improve the following deficits and impairments:  Abnormal gait, Decreased coordination, Decreased range of motion, Difficulty walking, Increased fascial restricitons, Impaired tone, Decreased endurance, Increased muscle spasms, Decreased activity tolerance, Pain, Decreased balance, Impaired flexibility, Improper body mechanics, Decreased mobility, Decreased strength, Impaired sensation, Postural dysfunction  Visit Diagnosis: Muscle weakness (generalized)  Abnormality of gait  History of falling  Other symptoms and signs involving the nervous system     Problem List Patient Active Problem List   Diagnosis Date Noted   Nausea without vomiting 07/23/2021   Vitamin D deficiency 07/06/2021   History of CVA (cerebrovascular accident) 07/06/2021   Other constipation 03/18/2021   Hot flashes 03/17/2021   Prediabetes 02/11/2021   COVID-19 42/59/5638   Metabolic syndrome 75/64/3329   Neuropathic pain 09/19/2020   Adjustment disorder with mixed anxiety and depressed mood 09/19/2020   Facial droop 08/30/2020   Abnormality of gait 05/09/2020   Hemiplegia of nondominant side, late effect of cerebrovascular disease ([place]) 03/28/2020   Pleural effusion 03/01/2020   Sleep disturbance 02/06/2020   Spastic hemiplegia of left nondominant side due to nontraumatic intraparenchymal hemorrhage of brain ([place]) 12/26/2019   Trigger thumb of right hand 11/02/2019   OSA (obstructive sleep apnea) 07/26/2019   Spastic hemiplegia of left nondominant side as late effect of nontraumatic intraparenchymal hemorrhage of brain (HCC) 04/06/2019   Spastic hemiparesis affecting dominant side ([place]) 01/04/2018   Hyperglycemia 12/29/2016   Depression 12/29/2016    Muscle spasticity    Class 1 obesity with serious comorbidity and body mass index (BMI) of 33.0 to 33.9 in adult 04/08/2016   Hyperlipidemia 04/08/2016   Basal ganglia hemorrhage ([place]) 04/08/2016   Dysphagia as late effect of cerebrovascular disease    Migraine with aura and without status migrainosus, not intractable    Gait disturbance, post-stroke    Hemiplegia, post-stroke (HCC)    Aphasia, post-stroke    Dysarthria, post-stroke    ICH (intracerebral hemorrhage) ([name]) - R basal ganglia due to hypertensive emergency 04/01/2016   Upper airway cough syndrome 12/06/2015   PCP NOTES >>>>>>>>>>>>>>>>>>>>>>>>>>>.. 09/25/2015   Insomnia 12/28/2012   Annual physical exam 11/23/2011   Essential hypertension 10/19/2007   Ann Lions PT, DPT, PN2   Supplemental Physical Therapist [facility]    Pager [phone] Acute Rehab Office [place]. [place], [place], [address] Phone: [phone]   Fax:  [phone]  Name: RIONA LAHTI MRN: [id number] Date of Birth: May 30, 1971

## 2021-12-31 ENCOUNTER — Ambulatory Visit (INDEPENDENT_AMBULATORY_CARE_PROVIDER_SITE_OTHER): Payer: No Typology Code available for payment source | Admitting: Surgical

## 2021-12-31 ENCOUNTER — Ambulatory Visit (INDEPENDENT_AMBULATORY_CARE_PROVIDER_SITE_OTHER): Payer: No Typology Code available for payment source

## 2021-12-31 DIAGNOSIS — S42032A Displaced fracture of lateral end of left clavicle, initial encounter for closed fracture: Secondary | ICD-10-CM

## 2022-01-01 ENCOUNTER — Ambulatory Visit: Payer: No Typology Code available for payment source | Admitting: Physical Therapy

## 2022-01-01 ENCOUNTER — Other Ambulatory Visit: Payer: Self-pay

## 2022-01-01 ENCOUNTER — Encounter: Payer: Self-pay | Admitting: Physical Therapy

## 2022-01-01 DIAGNOSIS — R29818 Other symptoms and signs involving the nervous system: Secondary | ICD-10-CM

## 2022-01-01 DIAGNOSIS — M6281 Muscle weakness (generalized): Secondary | ICD-10-CM

## 2022-01-01 DIAGNOSIS — Z9181 History of falling: Secondary | ICD-10-CM | POA: Diagnosis not present

## 2022-01-01 DIAGNOSIS — I69352 Hemiplegia and hemiparesis following cerebral infarction affecting left dominant side: Secondary | ICD-10-CM | POA: Diagnosis not present

## 2022-01-01 DIAGNOSIS — R269 Unspecified abnormalities of gait and mobility: Secondary | ICD-10-CM | POA: Diagnosis not present

## 2022-01-01 NOTE — Therapy (Signed)
[place]. [place], [place], [address] Phone: [phone]   Fax:  [phone]  Physical Therapy Treatment  Patient Details  Name: Abigail Campos MRN: [id number] Date of Birth: 07-24-1971 Referring Provider (PT): Dr Letta Pate   Encounter Date: 01/01/2022   PT End of Session - 01/01/22 1404     Visit Number 14    Number of Visits 22    Date for PT Re-Evaluation 01/29/22    Authorization Type [employer]    Progress Note Due on Visit 20    PT Start Time 1315    PT Stop Time 1356    PT Time Calculation (min) 41 min    Activity Tolerance Patient tolerated treatment well    Behavior During Therapy [facility] for tasks assessed/performed             Past Medical History:  Diagnosis Date   Anemia    Anxiety and depression    Back pain    Essential hypertension 10/19/2007   03-2010: metoprolol changed to bystolic (was not feeling well on it, no specific allergy or reaction)     Food allergy    Hemiplegia ([place])    Hyperlipemia    Hypertension    Joint pain    Migraine    Neuromuscular disorder ([place])    left sided hemiplegia - has brace for l leg and walks with a cane   Obesity    Ovarian cancer ([place])    Prediabetes    Sleep apnea    not wearing c-pap yet   Sleep apnea    Stroke ([place]) 2017   Vitamin D deficiency     Past Surgical History:  Procedure Laterality Date   ABDOMINAL HYSTERECTOMY  11-14-07   no oophorectomy per surgical report   CESAREAN SECTION     [id number]   INGUINAL HERNIA REPAIR     2004    There were no vitals filed for this visit.   Subjective Assessment - 01/01/22 1327     Subjective I'm doing good, Dr. Marlou Sa looked my clavicle again yesterday, bone's not healing like he wanted but doesn't want to do surgery. Going to try some vitamin D supplements and return to him in 6 weeks. No falls since last time I was here, no close calls either.    Pertinent History PMH: HTN, hx of  CVA (2017), ovarian cancer)    Currently in Pain? No/denies                               [facility] Adult PT Treatment/Exercise - 01/01/22 0001       Knee/Hip Exercises: Aerobic   Nustep L5 x 5 min   ModA for good alignment L LE     Knee/Hip Exercises: Supine   Bridges Both;1 set;10 reps    Bridges Limitations min A for good alignment L LE, brace on    Other Supine Knee/Hip Exercises supine marches 1x10 LLE red TB; single leg bridges 1x10 L LE Mod facilitation    Other Supine Knee/Hip Exercises LLE clams MinA to initiate L LE out with red TB; also had her stabilize with L LE while she pulled R LE into clam shell 1x10 with yellow TB                     PT Education - 01/01/22 1404     Education Details exercise form,  multimodal facilitation for form    Person(s) Educated Patient    Methods Explanation    Comprehension Verbalized understanding              PT Short Term Goals - 12/18/21 1721       PT SHORT TERM GOAL #1   Title Patient will be I with basic HEP    Time 1    Period Weeks    Status Achieved               PT Long Term Goals - 12/18/21 1721       PT LONG TERM GOAL #1   Title Patient to be independent with final HEP for improved functional mobility, decreased fall risk.    Time 6    Period Weeks    Status On-going      PT LONG TERM GOAL #2   Title Decrease time for 5 x sit to stand test to < 20 seconds.    Baseline 1/5- 21    Time 8    Period Weeks    Status On-going      PT LONG TERM GOAL #3   Title Pt will decr TUG time to 20 seconds or less with SPC with quad tip in order to demo decr fall risk.    Baseline 1/5- 19    Time 8    Period Weeks    Status Achieved      PT LONG TERM GOAL #4   Title Pt will ambulate at least 300' over indoor level and outdoor unlevel surfaces with LRAD with supervision in order to demo improved community mobility.    Baseline 1/5- has been able to walk over gravel with cane about  29ft; even surfaces no limits with cane    Time 8    Period Weeks    Status On-going    Target Date 12/24/21                   Plan - 01/01/22 1404     Clinical Impression Statement Abigail Campos arrives today, doing well just frustrated her husband changed the time in her car to being super early and she feels off schedule now. Seemed a bit more internally and externally distracted than usual today. Did some work on the [employer] with modA for good alignment L LE, otherwise spent more time on core strength and more focused L LE strength on the table today. L LE still with poor motor control and easily fatigued, needed pretty consistent multimodal cues to facilitate good alignment and form. Will continue efforts.    Personal Factors and Comorbidities Comorbidity 3+;Time since onset of injury/illness/exacerbation;Fitness;Past/Current Experience    Comorbidities HTN, hx of CVA (2017), ovarian cancer    Examination-Activity Limitations Locomotion Level;Stand;Transfers;Caring for Others    Examination-Participation Restrictions Community Activity    Stability/Clinical Decision Making Stable/Uncomplicated    Clinical Decision Making Low    Rehab Potential Good    PT Frequency 2x / week    PT Duration 6 weeks    PT Treatment/Interventions ADLs/Self Care Home Management;Aquatic Therapy;DME Instruction;Gait training;Functional mobility training;Therapeutic activities;Stair training;Therapeutic exercise;Balance training;Neuromuscular re-education;Orthotic Fit/Training;Patient/family education;Passive range of motion;Dry needling;Manual techniques;Iontophoresis 4mg /ml Dexamethasone;Electrical Stimulation    PT Next Visit Plan continue working on gait training, motor control and strength R LE. Work in Location manager as well per pt request.    PT Home Exercise Plan Access Code: [email or web address]    Consulted and Agree with Plan of Care Patient  Patient will benefit from skilled therapeutic  intervention in order to improve the following deficits and impairments:  Abnormal gait, Decreased coordination, Decreased range of motion, Difficulty walking, Increased fascial restricitons, Impaired tone, Decreased endurance, Increased muscle spasms, Decreased activity tolerance, Pain, Decreased balance, Impaired flexibility, Improper body mechanics, Decreased mobility, Decreased strength, Impaired sensation, Postural dysfunction  Visit Diagnosis: Muscle weakness (generalized)  Abnormality of gait  History of falling  Other symptoms and signs involving the nervous system     Problem List Patient Active Problem List   Diagnosis Date Noted   Nausea without vomiting 07/23/2021   Vitamin D deficiency 07/06/2021   History of CVA (cerebrovascular accident) 07/06/2021   Other constipation 03/18/2021   Hot flashes 03/17/2021   Prediabetes 02/11/2021   COVID-19 46/27/0350   Metabolic syndrome 09/38/1829   Neuropathic pain 09/19/2020   Adjustment disorder with mixed anxiety and depressed mood 09/19/2020   Facial droop 08/30/2020   Abnormality of gait 05/09/2020   Hemiplegia of nondominant side, late effect of cerebrovascular disease ([place]) 03/28/2020   Pleural effusion 03/01/2020   Sleep disturbance 02/06/2020   Spastic hemiplegia of left nondominant side due to nontraumatic intraparenchymal hemorrhage of brain ([place]) 12/26/2019   Trigger thumb of right hand 11/02/2019   OSA (obstructive sleep apnea) 07/26/2019   Spastic hemiplegia of left nondominant side as late effect of nontraumatic intraparenchymal hemorrhage of brain ([place]) 04/06/2019   Spastic hemiparesis affecting dominant side ([place]) 01/04/2018   Hyperglycemia 12/29/2016   Depression 12/29/2016   Muscle spasticity    Class 1 obesity with serious comorbidity and body mass index (BMI) of 33.0 to 33.9 in adult 04/08/2016   Hyperlipidemia 04/08/2016   Basal ganglia hemorrhage ([place]) 04/08/2016   Dysphagia as late effect of  cerebrovascular disease    Migraine with aura and without status migrainosus, not intractable    Gait disturbance, post-stroke    Hemiplegia, post-stroke (HCC)    Aphasia, post-stroke    Dysarthria, post-stroke    ICH (intracerebral hemorrhage) ([place]) - R basal ganglia due to hypertensive emergency 04/01/2016   Upper airway cough syndrome 12/06/2015   PCP NOTES >>>>>>>>>>>>>>>>>>>>>>>>>>>.. 09/25/2015   Insomnia 12/28/2012   Annual physical exam 11/23/2011   Essential hypertension 10/19/2007   Ann Lions PT, DPT, PN2   Supplemental Physical Therapist [facility]    Pager [phone] Acute Rehab Office [place]. [place], [place], [address] Phone: [phone]   Fax:  [phone]  Name: Abigail Campos MRN: [id number] Date of Birth: [birth year]

## 2022-01-04 ENCOUNTER — Encounter: Payer: Self-pay | Admitting: Orthopedic Surgery

## 2022-01-04 NOTE — Progress Notes (Signed)
Post-fracture visit Note   Patient: Abigail Campos           Date of Birth: August 22, 1971           MRN: [id number] Visit Date: 12/31/2021 PCP: Colon Branch, MD   Assessment & Plan:  Chief Complaint:  Chief Complaint  Patient presents with   Other    Follow up clavicle fx   Visit Diagnoses:  1. Displaced fracture of lateral end of left clavicle, initial encounter for closed fracture     Plan: Patient is a 51 year old female who presents for repeat evaluation of left clavicle fracture.  Date of injury 10/31/2021.  She was doing well at her last visit but complains of mildly increased pain after falling last week.  Most of her pain that she is complained about she localizes to the region of the sternocleidomastoid muscle on her left side.  Denies any new bruising, swelling over the fracture site.  No axial cervical spine pain, scapular pain, radicular pain down the arm, numbness/tingling.  She has been using heating pad.  Able to sleep well through the night.  Radiographs of the clavicle today demonstrate no change in position at the fracture site but there is not much progression of fracture consolidation compared with previous radiographs.  She has minimal tenderness on exam over the fracture site so clinically it seems like she is improving healing the clavicle fracture.  Palpable DP pulse of the left upper extremity.  There is no new bruising or deformity that is visible over the fracture site.  Plan is to continue with conservative modalities for the left neck pain and mildly increased clavicle pain.  She will look out for radicular symptoms or other concerning signs for cervical spine pathology.  Follow-up in 6 weeks for clinical recheck with new radiographs at the time to check for further callus formation.  Follow-Up Instructions: No follow-ups on file.   Orders:  Orders Placed This Encounter  Procedures   XR Clavicle Left   No orders of the defined types were placed in this  encounter.   Imaging: No results found.  PMFS History: Patient Active Problem List   Diagnosis Date Noted   Nausea without vomiting 07/23/2021   Vitamin D deficiency 07/06/2021   History of CVA (cerebrovascular accident) 07/06/2021   Other constipation 03/18/2021   Hot flashes 03/17/2021   Prediabetes 02/11/2021   COVID-19 14/43/1540   Metabolic syndrome 08/67/6195   Neuropathic pain 09/19/2020   Adjustment disorder with mixed anxiety and depressed mood 09/19/2020   Facial droop 08/30/2020   Abnormality of gait 05/09/2020   Hemiplegia of nondominant side, late effect of cerebrovascular disease ([place]) 03/28/2020   Pleural effusion 03/01/2020   Sleep disturbance 02/06/2020   Spastic hemiplegia of left nondominant side due to nontraumatic intraparenchymal hemorrhage of brain ([place]) 12/26/2019   Trigger thumb of right hand 11/02/2019   OSA (obstructive sleep apnea) 07/26/2019   Spastic hemiplegia of left nondominant side as late effect of nontraumatic intraparenchymal hemorrhage of brain ([place]) 04/06/2019   Spastic hemiparesis affecting dominant side ([place]) 01/04/2018   Hyperglycemia 12/29/2016   Depression 12/29/2016   Muscle spasticity    Class 1 obesity with serious comorbidity and body mass index (BMI) of 33.0 to 33.9 in adult 04/08/2016   Hyperlipidemia 04/08/2016   Basal ganglia hemorrhage ([place]) 04/08/2016   Dysphagia as late effect of cerebrovascular disease    Migraine with aura and without status migrainosus, not intractable    Gait disturbance, post-stroke  Hemiplegia, post-stroke (HCC)    Aphasia, post-stroke    Dysarthria, post-stroke    ICH (intracerebral hemorrhage) ([place]) - R basal ganglia due to hypertensive emergency 04/01/2016   Upper airway cough syndrome 12/06/2015   PCP NOTES >>>>>>>>>>>>>>>>>>>>>>>>>>>.. 09/25/2015   Insomnia 12/28/2012   Annual physical exam 11/23/2011   Essential hypertension 10/19/2007   Past Medical History:  Diagnosis Date    Anemia    Anxiety and depression    Back pain    Essential hypertension 10/19/2007   03-2010: metoprolol changed to bystolic (was not feeling well on it, no specific allergy or reaction)     Food allergy    Hemiplegia (HCC)    Hyperlipemia    Hypertension    Joint pain    Migraine    Neuromuscular disorder ([place])    left sided hemiplegia - has brace for l leg and walks with a cane   Obesity    Ovarian cancer ([place])    Prediabetes    Sleep apnea    not wearing c-pap yet   Sleep apnea    Stroke ([place]) 2017   Vitamin D deficiency     Family History  Problem Relation Age of Onset   Diabetes Mother    Hypertension Mother    Glaucoma Mother    Colon cancer Mother        mother age 8   Cancer Mother    Anxiety disorder Mother    Obesity Mother    Rectal cancer Father        dx 74   Hypertension Father    Cancer Father    Sleep apnea Father    Alcoholism Father    Obesity Father    Stroke Paternal Grandfather    Stroke Maternal Grandfather    Diabetes Brother    Hypertension Brother    Heart disease Maternal Aunt        x 2 did 2012 CAD-CHF   Diabetes Brother    Hypertension Brother    Lung cancer Maternal Aunt    Breast cancer Neg Hx    Esophageal cancer Neg Hx    Stomach cancer Neg Hx     Past Surgical History:  Procedure Laterality Date   ABDOMINAL HYSTERECTOMY  11-14-07   no oophorectomy per surgical report   CESAREAN SECTION     [id number]   INGUINAL HERNIA REPAIR     2004   Social History   Occupational History   Occupation: disable d/t stroke TEACHER    Employer: [employer]  Tobacco Use   Smoking status: Never   Smokeless tobacco: Never  Vaping Use   Vaping Use: Never used  Substance and Sexual Activity   Alcohol use: No   Drug use: No   Sexual activity: Not Currently    Birth control/protection: Surgical

## 2022-01-06 ENCOUNTER — Telehealth: Payer: Self-pay

## 2022-01-06 ENCOUNTER — Encounter (HOSPITAL_BASED_OUTPATIENT_CLINIC_OR_DEPARTMENT_OTHER): Payer: Self-pay | Admitting: Urology

## 2022-01-06 ENCOUNTER — Emergency Department (HOSPITAL_BASED_OUTPATIENT_CLINIC_OR_DEPARTMENT_OTHER)
Admission: EM | Admit: 2022-01-06 | Discharge: 2022-01-06 | Disposition: A | Discharge status: Home / Self Care | Payer: No Typology Code available for payment source | Attending: Emergency Medicine | Admitting: Emergency Medicine

## 2022-01-06 ENCOUNTER — Ambulatory Visit: Payer: No Typology Code available for payment source | Admitting: Physical Therapy

## 2022-01-06 ENCOUNTER — Other Ambulatory Visit: Payer: Self-pay

## 2022-01-06 DIAGNOSIS — Z79899 Other long term (current) drug therapy: Secondary | ICD-10-CM | POA: Diagnosis not present

## 2022-01-06 DIAGNOSIS — I10 Essential (primary) hypertension: Secondary | ICD-10-CM | POA: Insufficient documentation

## 2022-01-06 DIAGNOSIS — Z91040 Latex allergy status: Secondary | ICD-10-CM | POA: Insufficient documentation

## 2022-01-06 DIAGNOSIS — Z91010 Allergy to peanuts: Secondary | ICD-10-CM | POA: Diagnosis not present

## 2022-01-06 DIAGNOSIS — Z9181 History of falling: Secondary | ICD-10-CM

## 2022-01-06 DIAGNOSIS — Z7901 Long term (current) use of anticoagulants: Secondary | ICD-10-CM | POA: Insufficient documentation

## 2022-01-06 DIAGNOSIS — M6281 Muscle weakness (generalized): Secondary | ICD-10-CM

## 2022-01-06 DIAGNOSIS — R269 Unspecified abnormalities of gait and mobility: Secondary | ICD-10-CM

## 2022-01-06 LAB — BASIC METABOLIC PANEL
Anion gap: 6 (ref 5–15)
BUN: 9 mg/dL (ref 6–20)
CO2: 27 mmol/L (ref 22–32)
Calcium: 9.3 mg/dL (ref 8.9–10.3)
Chloride: 105 mmol/L (ref 98–111)
Creatinine, Ser: 0.97 mg/dL (ref 0.44–1.00)
GFR, Estimated: >60 mL/min (ref >60)
Glucose, Bld: 85 mg/dL (ref 70–99)
Potassium: 3.6 mmol/L (ref 3.5–5.1)
Sodium: 138 mmol/L (ref 135–145)

## 2022-01-06 LAB — CBC WITH DIFFERENTIAL/PLATELET
Abs Immature Granulocytes: 0.01 10*3/uL (ref 0.00–0.07)
Basophils Absolute: 0 10*3/uL (ref 0.0–0.1)
Basophils Relative: 0 %
Eosinophils Absolute: 0 10*3/uL (ref 0.0–0.5)
Eosinophils Relative: 1 %
HCT: 38.6 % (ref 36.0–46.0)
Hemoglobin: 12.4 g/dL (ref 12.0–15.0)
Immature Granulocytes: 0 %
Lymphocytes Relative: 42 %
Lymphs Abs: 1.8 10*3/uL (ref 0.7–4.0)
MCH: 30.4 pg (ref 26.0–34.0)
MCHC: 32.1 g/dL (ref 30.0–36.0)
MCV: 94.6 fL (ref 80.0–100.0)
Monocytes Absolute: 0.4 10*3/uL (ref 0.1–1.0)
Monocytes Relative: 9 %
Neutro Abs: 2 10*3/uL (ref 1.7–7.7)
Neutrophils Relative %: 48 %
Platelets: 174 10*3/uL (ref 150–400)
RBC: 4.08 MIL/uL (ref 3.87–5.11)
RDW: 13.4 % (ref 11.5–15.5)
WBC: 4.2 10*3/uL (ref 4.0–10.5)
nRBC: 0 % (ref 0.0–0.2)

## 2022-01-06 NOTE — Discharge Instructions (Addendum)
Please follow-up with your primary doctor.  Discuss your blood pressure management with him at the time.  Continue your current regimen of blood pressure medicine for now.  Come back to ER if you develop chest pain, difficulty breathing, lightheadedness, episodes of passing out, severe headache or other new concerning symptom.

## 2022-01-06 NOTE — Therapy (Signed)
[place]. [place], [place], [address] Phone: [phone]   Fax:  [phone]  Patient Details  Name: Abigail Campos MRN: [id number] Date of Birth: [birth year] Referring Provider:  Colon Branch, MD  Encounter Date: 01/06/2022  Lacy Duverney arrived today not feeling well, tells me she feels "off" and more unsteady than usual, asked me to take her temperature which was 98 degrees.  I also took BP with first measurement being 143/112, and second measurement after several minutes of seated rest being 147/120. HR WNL.   Given her hx of CVA, I called her PCP's office directly, they advised she go straight to urgent care. I offered to call EMS given that she drove herself to PT, she refused. I reinforced going straight to ED/urgent care center, we will call her tomorrow to check in on how she is doing.   Windell Norfolk, DPT, PN2   Supplemental Physical Therapist [place]    Pager [phone] Acute Rehab Office [place]. [place], [place], [address] Phone: [phone]   Fax:  [phone]

## 2022-01-06 NOTE — Telephone Encounter (Signed)
Received call from [facility] was there today for routine PT appt- original bp was 143/112 and second attempt was 147/120. Given her medical hx I was recommended her go to UC (no visits available today).

## 2022-01-06 NOTE — ED Triage Notes (Signed)
Sent from PCP for BP 147/120, denies any related symptoms  H/o htn took medication today

## 2022-01-06 NOTE — ED Provider Notes (Signed)
[facility] EMERGENCY DEPARTMENT Provider Note   [name]: [id number] Arrival date & time: 01/06/22  1505     History  Chief Complaint  Patient presents with   Hypertension    Abigail Campos is a 51 y.o. female.  Presented to the emergency room with concern for high blood pressure.  Patient reports that she was in her normal state of health.  Her blood pressure this morning was elevated.  Discussed with physical therapist and primary care doctor and was recommended to come to ER for further evaluation.  Had commented that she felt somewhat off earlier.  Patient states that this was general sense of fatigue.  Currently does not feel off, feels like her normal self.  Specifically denies any chest pain, difficulty breathing, headache, vomiting, or other acute complaint.  States that she monitors her blood pressure daily.  Normally is well controlled, 585 systolic over 27P systolic.  Takes both Coreg and lisinopril.  No recent missed doses.  Additional history obtained from chart review, review of PT note, telephone note from primary care doctor earlier today.  HPI     Home Medications Prior to Admission medications   Medication Sig Start Date End Date Taking? Authorizing Provider  atorvastatin (LIPITOR) 20 MG tablet TAKE 1 TABLET BY MOUTH EVERY DAY 07/29/21   Colon Branch, MD  baclofen (LIORESAL) 10 MG tablet Take 3 tablets (30 mg total) by mouth 3 (three) times daily. 10/17/21   Kirsteins, Luanna Salk, MD  buPROPion (WELLBUTRIN) 100 MG tablet TAKE 1 TABLET BY MOUTH TWICE A DAY 10/13/21   Colon Branch, MD  carvedilol (COREG) 6.25 MG tablet TAKE 1 TABLET BY MOUTH 2 TIMES DAILY WITH A MEAL. 12/11/21   Colon Branch, MD  clopidogrel (PLAVIX) 75 MG tablet TAKE 1 TABLET BY MOUTH EVERY DAY 10/23/21   Colon Branch, MD  escitalopram (LEXAPRO) 20 MG tablet TAKE 1 TABLET BY MOUTH EVERY DAY 08/12/21   Colon Branch, MD  gabapentin (NEURONTIN) 300 MG capsule TAKE 1 CAPSULE BY MOUTH THREE TIMES A DAY  12/11/21   Kirsteins, Luanna Salk, MD  lisinopril (ZESTRIL) 2.5 MG tablet TAKE 1 TABLET BY MOUTH EVERY DAY 12/11/21   Colon Branch, MD  tirzepatide [facility]) 5 MG/0.5ML Pen Inject 5 mg into the skin once a week. 12/29/21   Briscoe Deutscher, DO  traZODone (DESYREL) 100 MG tablet Take 1 tablet (100 mg total) by mouth at bedtime as needed for sleep. 07/09/21   Colon Branch, MD      Allergies    Bee venom, Peanut-containing drug products, Aspirin, Isovue [iopamidol], Latex, Ambien [zolpidem tartrate], and Hydrocodone    Review of Systems   Review of Systems  Physical Exam Updated Vital Signs BP (!) 157/103    Pulse 64    Temp 97.9 F (36.6 C) (Oral)    Resp 15    Ht 4\' 11"  (1.499 m)    Wt 65.8 kg    SpO2 97%    BMI 29.29 kg/m  Physical Exam  ED Results / Procedures / Treatments   Labs (all labs ordered are listed, but only abnormal results are displayed) Labs Reviewed  CBC WITH DIFFERENTIAL/PLATELET  BASIC METABOLIC PANEL    EKG None  Radiology No results found.  Procedures Procedures    Medications Ordered in ED Medications - No data to display  ED Course/ Medical Decision Making/ A&P  Medical Decision Making Amount and/or Complexity of Data Reviewed Labs: ordered.   51 year old lady presented to ER with concern for elevated blood pressure.  Earlier this morning when she was discussing with physical therapist and her primary care doctor over the phone, had endorsed feeling "off".  Currently she does not have any symptoms.  Her blood pressure was noted to be mildly elevated, initial BP in triage was 235T systolic over 614 diastolic.  Diastolic blood pressure improved without intervention to 103.  Screening labs are within normal limits, EKG unremarkable.  Given patient does not have any specific symptoms and work-up today is reassuring, will discharge home.  Recommend following up with her primary care doctor to discuss long-term blood pressure  management.  Additional history obtained from chart review, review of PT note, telephone note from primary care doctor earlier today.        Final Clinical Impression(s) / ED Diagnoses Final diagnoses:  Hypertension, unspecified type    Rx / DC Orders ED Discharge Orders     None         Lucrezia Starch, MD 01/06/22 2055

## 2022-01-06 NOTE — ED Notes (Signed)
Patient given discharge instructions and reviewed.    Pt denies any questions at this time.  Patient discharged in care of self.

## 2022-01-06 NOTE — Telephone Encounter (Signed)
Very high diastolic BP, urgent care is appropriate

## 2022-01-07 ENCOUNTER — Telehealth: Payer: Self-pay | Admitting: Physical Therapy

## 2022-01-07 NOTE — Telephone Encounter (Signed)
Attempted to call pt to check in on how she is feeling after yesterday- call forwarded to VM. Did not leave message. I will have our front desk to schedule more appointments moving forward, if she is interested.    Windell Norfolk, DPT, PN2   Supplemental Physical Therapist [place]    Pager [phone] Acute Rehab Office [phone]

## 2022-01-08 ENCOUNTER — Other Ambulatory Visit: Payer: Self-pay | Admitting: Internal Medicine

## 2022-01-08 ENCOUNTER — Telehealth: Payer: Self-pay | Admitting: Internal Medicine

## 2022-01-08 ENCOUNTER — Encounter: Payer: Self-pay | Admitting: Internal Medicine

## 2022-01-08 ENCOUNTER — Other Ambulatory Visit: Payer: Self-pay | Admitting: Physical Medicine & Rehabilitation

## 2022-01-08 DIAGNOSIS — Z1231 Encounter for screening mammogram for malignant neoplasm of breast: Secondary | ICD-10-CM

## 2022-01-08 NOTE — Telephone Encounter (Signed)
Patient went to the ER with elevated diastolic BP.  On reviewing available blood pressure readings, diastolic BP has been slightly elevated since early this month. Plan: Call patient, increase carvedilol to 12.5 mg twice a day.  Send a new prescription. Patient to monitor BPs at home, call with readings in 2 weeks. Also, low-salt diet, good hydration, avoid any NSAIDs.

## 2022-01-09 ENCOUNTER — Ambulatory Visit: Payer: No Typology Code available for payment source | Admitting: Internal Medicine

## 2022-01-09 MED ORDER — CARVEDILOL 12.5 MG PO TABS: 12.5000 mg | ORAL_TABLET | Freq: Two times a day (BID) | ORAL | 1 refills | Status: DC

## 2022-01-09 NOTE — Telephone Encounter (Signed)
Mychart message sent yesterday afternoon by Pt. Informed of PCP recommendations. Carvedilol 12.5mg  bid sent to [facility].

## 2022-01-12 ENCOUNTER — Encounter: Payer: Self-pay | Admitting: Physical Therapy

## 2022-01-12 ENCOUNTER — Ambulatory Visit: Payer: No Typology Code available for payment source | Admitting: Internal Medicine

## 2022-01-12 ENCOUNTER — Other Ambulatory Visit: Payer: Self-pay

## 2022-01-12 ENCOUNTER — Ambulatory Visit: Payer: No Typology Code available for payment source | Admitting: Physical Therapy

## 2022-01-12 DIAGNOSIS — R29818 Other symptoms and signs involving the nervous system: Secondary | ICD-10-CM | POA: Diagnosis not present

## 2022-01-12 DIAGNOSIS — R269 Unspecified abnormalities of gait and mobility: Secondary | ICD-10-CM

## 2022-01-12 DIAGNOSIS — I69352 Hemiplegia and hemiparesis following cerebral infarction affecting left dominant side: Secondary | ICD-10-CM | POA: Diagnosis not present

## 2022-01-12 DIAGNOSIS — Z9181 History of falling: Secondary | ICD-10-CM | POA: Diagnosis not present

## 2022-01-12 DIAGNOSIS — M6281 Muscle weakness (generalized): Secondary | ICD-10-CM | POA: Diagnosis not present

## 2022-01-12 NOTE — Therapy (Signed)
[place]. [place], [place], [address] Phone: [phone]   Fax:  [phone]  Physical Therapy Treatment  Patient Details  Name: Abigail Campos MRN: [id number] Date of Birth: [birth year] Referring Provider (PT): Dr Letta Pate   Encounter Date: 01/12/2022   PT End of Session - 01/12/22 1236     Visit Number 16    Date for PT Re-Evaluation 01/29/22    Authorization Type [employer]    PT Start Time 5102    PT Stop Time 1230    PT Time Calculation (min) 45 min    Activity Tolerance Patient tolerated treatment well    Behavior During Therapy [facility] for tasks assessed/performed             Past Medical History:  Diagnosis Date   Anemia    Anxiety and depression    Back pain    Essential hypertension 10/19/2007   03-2010: metoprolol changed to bystolic (was not feeling well on it, no specific allergy or reaction)     Food allergy    Hemiplegia ([place])    Hyperlipemia    Hypertension    Joint pain    Migraine    Neuromuscular disorder ([place])    left sided hemiplegia - has brace for l leg and walks with a cane   Obesity    Ovarian cancer ([place])    Prediabetes    Sleep apnea    not wearing c-pap yet   Sleep apnea    Stroke ([place]) 2017   Vitamin D deficiency     Past Surgical History:  Procedure Laterality Date   ABDOMINAL HYSTERECTOMY  11-14-07   no oophorectomy per surgical report   CESAREAN SECTION     [id number]   INGUINAL HERNIA REPAIR     2004    There were no vitals filed for this visit.   Subjective Assessment - 01/12/22 1151     Subjective "I feel fine"    Currently in Pain? No/denies                               [facility] Adult PT Treatment/Exercise - 01/12/22 0001       Ambulation/Gait   Ambulation/Gait Yes    Ambulation/Gait Assistance 6: Modified independent (Device/Increase time)    Ambulation Distance (Feet) 300 Feet    Assistive device Straight cane     Gait Pattern Step-to pattern;Step-through pattern;Decreased step length - right;Decreased step length - left;Decreased stance time - left;Decreased weight shift to left;Left flexed knee in stance;Lateral trunk lean to left    Stairs Yes    Stairs Assistance 5: Supervision    Stair Management Technique One rail Right;Alternating pattern;Step to pattern    Number of Stairs 15    Height of Stairs 4   7 6    Gait Comments Gait 275 outside in parking lot with [facility]      High Level Balance   High Level Balance Activities Side stepping      Knee/Hip Exercises: Standing   Other Standing Knee Exercises standing marches 2x10      Knee/Hip Exercises: Seated   Other Seated Knee/Hip Exercises fitter press 1 black 2x15                       PT Short Term Goals - 12/18/21 1721       PT SHORT TERM GOAL #1  Title Patient will be I with basic HEP    Time 1    Period Weeks    Status Achieved               PT Long Term Goals - 12/18/21 1721       PT LONG TERM GOAL #1   Title Patient to be independent with final HEP for improved functional mobility, decreased fall risk.    Time 6    Period Weeks    Status On-going      PT LONG TERM GOAL #2   Title Decrease time for 5 x sit to stand test to < 20 seconds.    Baseline 1/5- 21    Time 8    Period Weeks    Status On-going      PT LONG TERM GOAL #3   Title Pt will decr TUG time to 20 seconds or less with SPC with quad tip in order to demo decr fall risk.    Baseline 1/5- 19    Time 8    Period Weeks    Status Achieved      PT LONG TERM GOAL #4   Title Pt will ambulate at least 300' over indoor level and outdoor unlevel surfaces with LRAD with supervision in order to demo improved community mobility.    Baseline 1/5- has been able to walk over gravel with cane about 72ft; even surfaces no limits with cane    Time 8    Period Weeks    Status On-going    Target Date 12/24/21                   Plan - 01/12/22  1224     Clinical Impression Statement Pt enters clinic feeling fine. BP taken twice at the beginning of session reading 130/79 and 136/98. Symptoms monitored throughout session. Heavy focus on functional interventions. Some fatigue noted with standing marches and sides steps. Little to no L hip extension does cause pt to drift forward with marches. L hip weakness with side steps.    Personal Factors and Comorbidities Comorbidity 3+;Time since onset of injury/illness/exacerbation;Fitness;Past/Current Experience    Comorbidities HTN, hx of CVA (2017), ovarian cancer    Examination-Activity Limitations Locomotion Level;Stand;Transfers;Caring for Others    Stability/Clinical Decision Making Stable/Uncomplicated    PT Frequency 2x / week    PT Duration 6 weeks    PT Treatment/Interventions ADLs/Self Care Home Management;Aquatic Therapy;DME Instruction;Gait training;Functional mobility training;Therapeutic activities;Stair training;Therapeutic exercise;Balance training;Neuromuscular re-education;Orthotic Fit/Training;Patient/family education;Passive range of motion;Dry needling;Manual techniques;Iontophoresis 4mg /ml Dexamethasone;Electrical Stimulation    PT Next Visit Plan continue working on gait training, motor control and strength R LE. Work in core strength as well per pt request.             Patient will benefit from skilled therapeutic intervention in order to improve the following deficits and impairments:  Abnormal gait, Decreased coordination, Decreased range of motion, Difficulty walking, Increased fascial restricitons, Impaired tone, Decreased endurance, Increased muscle spasms, Decreased activity tolerance, Pain, Decreased balance, Impaired flexibility, Improper body mechanics, Decreased mobility, Decreased strength, Impaired sensation, Postural dysfunction  Visit Diagnosis: Abnormality of gait  History of falling  Other symptoms and signs involving the nervous  system     Problem List Patient Active Problem List   Diagnosis Date Noted   Nausea without vomiting 07/23/2021   Vitamin D deficiency 07/06/2021   History of CVA (cerebrovascular accident) 07/06/2021   Other constipation 03/18/2021   Hot flashes 03/17/2021  Prediabetes 02/11/2021   COVID-19 22/48/2500   Metabolic syndrome 37/03/8888   Neuropathic pain 09/19/2020   Adjustment disorder with mixed anxiety and depressed mood 09/19/2020   Facial droop 08/30/2020   Abnormality of gait 05/09/2020   Hemiplegia of nondominant side, late effect of cerebrovascular disease ([place]) 03/28/2020   Pleural effusion 03/01/2020   Sleep disturbance 02/06/2020   Spastic hemiplegia of left nondominant side due to nontraumatic intraparenchymal hemorrhage of brain ([place]) 12/26/2019   Trigger thumb of right hand 11/02/2019   OSA (obstructive sleep apnea) 07/26/2019   Spastic hemiplegia of left nondominant side as late effect of nontraumatic intraparenchymal hemorrhage of brain ([place]) 04/06/2019   Spastic hemiparesis affecting dominant side ([place]) 01/04/2018   Hyperglycemia 12/29/2016   Depression 12/29/2016   Muscle spasticity    Class 1 obesity with serious comorbidity and body mass index (BMI) of 33.0 to 33.9 in adult 04/08/2016   Hyperlipidemia 04/08/2016   Basal ganglia hemorrhage ([place]) 04/08/2016   Dysphagia as late effect of cerebrovascular disease    Migraine with aura and without status migrainosus, not intractable    Gait disturbance, post-stroke    Hemiplegia, post-stroke (HCC)    Aphasia, post-stroke    Dysarthria, post-stroke    ICH (intracerebral hemorrhage) ([place]) - R basal ganglia due to hypertensive emergency 04/01/2016   Upper airway cough syndrome 12/06/2015   PCP NOTES >>>>>>>>>>>>>>>>>>>>>>>>>>>.. 09/25/2015   Insomnia 12/28/2012   Annual physical exam 11/23/2011   Essential hypertension 10/19/2007    Scot Jun, PTA 01/12/2022, 12:36 PM  [address] [place]. [place], [place], [address] Phone: [phone]   Fax:  [phone]  Name: Abigail Campos MRN: [id number] Date of Birth: 06/21/1971

## 2022-01-14 ENCOUNTER — Ambulatory Visit
Payer: No Typology Code available for payment source | Attending: Physical Medicine & Rehabilitation | Admitting: Physical Therapy

## 2022-01-14 ENCOUNTER — Encounter: Payer: Self-pay | Admitting: Physical Therapy

## 2022-01-14 ENCOUNTER — Other Ambulatory Visit: Payer: Self-pay

## 2022-01-14 DIAGNOSIS — I69359 Hemiplegia and hemiparesis following cerebral infarction affecting unspecified side: Secondary | ICD-10-CM | POA: Diagnosis not present

## 2022-01-14 DIAGNOSIS — R269 Unspecified abnormalities of gait and mobility: Secondary | ICD-10-CM | POA: Insufficient documentation

## 2022-01-14 DIAGNOSIS — I69352 Hemiplegia and hemiparesis following cerebral infarction affecting left dominant side: Secondary | ICD-10-CM | POA: Diagnosis not present

## 2022-01-14 DIAGNOSIS — R29818 Other symptoms and signs involving the nervous system: Secondary | ICD-10-CM | POA: Diagnosis not present

## 2022-01-14 DIAGNOSIS — R2689 Other abnormalities of gait and mobility: Secondary | ICD-10-CM | POA: Diagnosis not present

## 2022-01-14 DIAGNOSIS — Z9181 History of falling: Secondary | ICD-10-CM | POA: Insufficient documentation

## 2022-01-14 DIAGNOSIS — M6281 Muscle weakness (generalized): Secondary | ICD-10-CM | POA: Diagnosis not present

## 2022-01-14 NOTE — Therapy (Signed)
[place]. [place], [place], [address] Phone: [phone]   Fax:  [phone]  Physical Therapy Treatment  Patient Details  Name: Abigail Campos MRN: [id number] Date of Birth: 1971/02/20 Referring Provider (PT): Dr Letta Pate   Encounter Date: 01/14/2022   PT End of Session - 01/14/22 1225     Visit Number 17    Date for PT Re-Evaluation 01/29/22    PT Start Time 1144    PT Stop Time 1225    PT Time Calculation (min) 41 min    Activity Tolerance Patient tolerated treatment well    Behavior During Therapy [facility] for tasks assessed/performed             Past Medical History:  Diagnosis Date   Anemia    Anxiety and depression    Back pain    Essential hypertension 10/19/2007   03-2010: metoprolol changed to bystolic (was not feeling well on it, no specific allergy or reaction)     Food allergy    Hemiplegia ([place])    Hyperlipemia    Hypertension    Joint pain    Migraine    Neuromuscular disorder ([place])    left sided hemiplegia - has brace for l leg and walks with a cane   Obesity    Ovarian cancer ([place])    Prediabetes    Sleep apnea    not wearing c-pap yet   Sleep apnea    Stroke ([place]) 2017   Vitamin D deficiency     Past Surgical History:  Procedure Laterality Date   ABDOMINAL HYSTERECTOMY  11-14-07   no oophorectomy per surgical report   CESAREAN SECTION     [id number]   INGUINAL HERNIA REPAIR     2004    There were no vitals filed for this visit.   Subjective Assessment - 01/14/22 1144     Subjective "I feel fine" no falls    Currently in Pain? No/denies                               [facility] Adult PT Treatment/Exercise - 01/14/22 0001       Ambulation/Gait   Stairs Yes    Stairs Assistance 5: Supervision    Number of Stairs 15    Height of Stairs 4   & 6     High Level Balance   High Level Balance Activities Backward walking      Knee/Hip Exercises:  Aerobic   Nustep L3 x6 min le only      Knee/Hip Exercises: Machines for Strengthening   Total Gym Leg Press 20lb x5, then LLE 20lb 3x5      Knee/Hip Exercises: Seated   Sit to Sand 2 sets;10 reps;without UE support                       PT Short Term Goals - 12/18/21 1721       PT SHORT TERM GOAL #1   Title Patient will be I with basic HEP    Time 1    Period Weeks    Status Achieved               PT Long Term Goals - 12/18/21 1721       PT LONG TERM GOAL #1   Title Patient to be independent with final HEP for improved functional mobility, decreased fall risk.  Time 6    Period Weeks    Status On-going      PT LONG TERM GOAL #2   Title Decrease time for 5 x sit to stand test to < 20 seconds.    Baseline 1/5- 21    Time 8    Period Weeks    Status On-going      PT LONG TERM GOAL #3   Title Pt will decr TUG time to 20 seconds or less with SPC with quad tip in order to demo decr fall risk.    Baseline 1/5- 19    Time 8    Period Weeks    Status Achieved      PT LONG TERM GOAL #4   Title Pt will ambulate at least 300' over indoor level and outdoor unlevel surfaces with LRAD with supervision in order to demo improved community mobility.    Baseline 1/5- has been able to walk over gravel with cane about 32ft; even surfaces no limits with cane    Time 8    Period Weeks    Status On-going    Target Date 12/24/21                   Plan - 01/14/22 1225     Clinical Impression Statement Utilized leg press for LLE strength, therapist provided assist to prevent LLE form hyper extension. Constant cue required to maintain alternating step pattern with stair negotiation. Increase LLE give with ambulation and side steps due to LLE fatigue.    Personal Factors and Comorbidities Comorbidity 3+;Time since onset of injury/illness/exacerbation;Fitness;Past/Current Experience    Examination-Activity Limitations Locomotion Level;Stand;Transfers;Caring  for Others    Stability/Clinical Decision Making Stable/Uncomplicated    Rehab Potential Good    PT Next Visit Plan continue working on gait training, motor control and strength R LE. Work in core strength as well per pt request.             Patient will benefit from skilled therapeutic intervention in order to improve the following deficits and impairments:  Abnormal gait, Decreased coordination, Decreased range of motion, Difficulty walking, Increased fascial restricitons, Impaired tone, Decreased endurance, Increased muscle spasms, Decreased activity tolerance, Pain, Decreased balance, Impaired flexibility, Improper body mechanics, Decreased mobility, Decreased strength, Impaired sensation, Postural dysfunction  Visit Diagnosis: Abnormality of gait  Muscle weakness (generalized)  Other symptoms and signs involving the nervous system  History of falling     Problem List Patient Active Problem List   Diagnosis Date Noted   Nausea without vomiting 07/23/2021   Vitamin D deficiency 07/06/2021   History of CVA (cerebrovascular accident) 07/06/2021   Other constipation 03/18/2021   Hot flashes 03/17/2021   Prediabetes 02/11/2021   COVID-19 02/58/5277   Metabolic syndrome 82/42/3536   Neuropathic pain 09/19/2020   Adjustment disorder with mixed anxiety and depressed mood 09/19/2020   Facial droop 08/30/2020   Abnormality of gait 05/09/2020   Hemiplegia of nondominant side, late effect of cerebrovascular disease ([place]) 03/28/2020   Pleural effusion 03/01/2020   Sleep disturbance 02/06/2020   Spastic hemiplegia of left nondominant side due to nontraumatic intraparenchymal hemorrhage of brain ([place]) 12/26/2019   Trigger thumb of right hand 11/02/2019   OSA (obstructive sleep apnea) 07/26/2019   Spastic hemiplegia of left nondominant side as late effect of nontraumatic intraparenchymal hemorrhage of brain (HCC) 04/06/2019   Spastic hemiparesis affecting dominant side ([place])  01/04/2018   Hyperglycemia 12/29/2016   Depression 12/29/2016   Muscle spasticity  Class 1 obesity with serious comorbidity and body mass index (BMI) of 33.0 to 33.9 in adult 04/08/2016   Hyperlipidemia 04/08/2016   Basal ganglia hemorrhage ([place]) 04/08/2016   Dysphagia as late effect of cerebrovascular disease    Migraine with aura and without status migrainosus, not intractable    Gait disturbance, post-stroke    Hemiplegia, post-stroke (HCC)    Aphasia, post-stroke    Dysarthria, post-stroke    ICH (intracerebral hemorrhage) ([place]) - R basal ganglia due to hypertensive emergency 04/01/2016   Upper airway cough syndrome 12/06/2015   PCP NOTES >>>>>>>>>>>>>>>>>>>>>>>>>>>.. 09/25/2015   Insomnia 12/28/2012   Annual physical exam 11/23/2011   Essential hypertension 10/19/2007    Scot Jun, PTA 01/14/2022, 12:31 PM  [place]. [place], [place], [address] Phone: [phone]   Fax:  [phone]  Name: Abigail Campos MRN: [id number] Date of Birth: Sep 05, 1971

## 2022-01-16 ENCOUNTER — Other Ambulatory Visit: Payer: Self-pay | Admitting: Internal Medicine

## 2022-01-19 ENCOUNTER — Encounter (INDEPENDENT_AMBULATORY_CARE_PROVIDER_SITE_OTHER): Payer: Self-pay | Admitting: Family Medicine

## 2022-01-19 ENCOUNTER — Other Ambulatory Visit: Payer: Self-pay

## 2022-01-19 ENCOUNTER — Ambulatory Visit (INDEPENDENT_AMBULATORY_CARE_PROVIDER_SITE_OTHER): Payer: No Typology Code available for payment source | Admitting: Family Medicine

## 2022-01-19 VITALS — BP 143/93 | HR 79 | Temp 98.3°F | Ht 59.0 in | Wt 144.0 lb

## 2022-01-19 DIAGNOSIS — R7303 Prediabetes: Secondary | ICD-10-CM

## 2022-01-19 DIAGNOSIS — E7800 Pure hypercholesterolemia, unspecified: Secondary | ICD-10-CM | POA: Diagnosis not present

## 2022-01-19 DIAGNOSIS — M21372 Foot drop, left foot: Secondary | ICD-10-CM

## 2022-01-19 DIAGNOSIS — Z8673 Personal history of transient ischemic attack (TIA), and cerebral infarction without residual deficits: Secondary | ICD-10-CM

## 2022-01-19 DIAGNOSIS — R7301 Impaired fasting glucose: Secondary | ICD-10-CM | POA: Diagnosis not present

## 2022-01-19 DIAGNOSIS — I69898 Other sequelae of other cerebrovascular disease: Secondary | ICD-10-CM | POA: Diagnosis not present

## 2022-01-19 DIAGNOSIS — E669 Obesity, unspecified: Secondary | ICD-10-CM

## 2022-01-19 DIAGNOSIS — Z6829 Body mass index (BMI) 29.0-29.9, adult: Secondary | ICD-10-CM

## 2022-01-20 ENCOUNTER — Encounter: Payer: Self-pay | Admitting: Physical Therapy

## 2022-01-20 ENCOUNTER — Ambulatory Visit: Payer: No Typology Code available for payment source | Admitting: Physical Therapy

## 2022-01-20 DIAGNOSIS — I69352 Hemiplegia and hemiparesis following cerebral infarction affecting left dominant side: Secondary | ICD-10-CM | POA: Diagnosis not present

## 2022-01-20 DIAGNOSIS — M6281 Muscle weakness (generalized): Secondary | ICD-10-CM

## 2022-01-20 DIAGNOSIS — R2689 Other abnormalities of gait and mobility: Secondary | ICD-10-CM | POA: Diagnosis not present

## 2022-01-20 DIAGNOSIS — R269 Unspecified abnormalities of gait and mobility: Secondary | ICD-10-CM | POA: Diagnosis not present

## 2022-01-20 DIAGNOSIS — R29818 Other symptoms and signs involving the nervous system: Secondary | ICD-10-CM

## 2022-01-20 DIAGNOSIS — Z9181 History of falling: Secondary | ICD-10-CM

## 2022-01-20 MED ORDER — TIRZEPATIDE 5 MG/0.5ML ~~LOC~~ SOAJ: 5.0000 mg | SUBCUTANEOUS | 5 refills | Status: DC

## 2022-01-20 NOTE — Progress Notes (Signed)
Chief Complaint:   OBESITY Abigail Campos is here to discuss her progress with her obesity treatment plan along with follow-up of her obesity related diagnoses. See Medical Weight Management Flowsheet for complete bioelectrical impedance results.  Today's visit was #: 58 Starting weight: 164 lbs Starting date: 12/26/2020 Weight change since last visit: 3 lbs Total lbs lost to date: 20 lbs Total weight loss percentage to date: -12.20%  Nutrition Plan: Practicing portion control and making smarter food choices, such as increasing vegetables and decreasing simple carbohydrates for 80% of the time. Activity: Cardio/walking for 45 minutes 2-7 times per week. Anti-obesity medications: Mounjaro 5 mg subcutaneously weekly. Reported side effects: None.  Interim History: Abigail Campos's balance and strength are markedly improved.  She was even working on strength exercises while waiting in the room for our visit.  She has met her 20 pound weight loss goal.  Her mood is good.  Assessment/Plan:   1. Left foot drop, sequela of cardioembolic stroke Abigail Campos wears a left foot brace.  She needs a new one due to weight loss.  - For home use only DME Other see comment  2. Impaired fasting glucose, with polyphagia, improved significantly with Mounjaro Controlled. Current treatment: Mounjaro 5 mg subcutaneously weekly.    Plan:  Continue Mounjaro 5 mg subcutaneously weekly.  Will refill today, as per below. She will continue to focus on protein-rich, low simple carbohydrate foods. We reviewed the importance of hydration, regular exercise for stress reduction, and restorative sleep.  - Refill tirzepatide (MOUNJARO) 5 MG/0.5ML Pen; Inject 5 mg into the skin once a week.  Dispense: 2 mL; Refill: 5  3. Pure hypercholesterolemia, at goal, Rx Lipitor Course: Controlled. Lipid-lowering medications: Lipitor 20 mg daily.   Plan: Dietary changes: Increase soluble fiber, decrease simple carbohydrates, decrease saturated fat.  Exercise changes: Moderate to vigorous-intensity aerobic activity 150 minutes per week or as tolerated. We will continue to monitor along with PCP/specialists as it pertains to her weight loss journey.  Lab Results  Component Value Date   CHOL 127 06/25/2021   HDL 51 06/25/2021   LDLCALC 64 06/25/2021   TRIG 53 06/25/2021   CHOLHDL 2.5 06/25/2021   Lab Results  Component Value Date   ALT 26 11/24/2021   AST 18 11/24/2021   ALKPHOS 85 11/24/2021   BILITOT 0.6 11/24/2021   4. History of CVA (cerebrovascular accident), with residual left-sided weakness PCP manages Lipitor 20 mg daily. She has a history of CVA in 2017 with residual right-sided deficits. We will continue to monitor symptoms as they relate to her weight loss journey.  5. Obesity BMI today is 29.1  Course: Abigail Campos is currently in the action stage of change. As such, her goal is to continue with weight loss efforts.   Nutrition goals: She has agreed to practicing portion control and making smarter food choices, such as increasing vegetables and decreasing simple carbohydrates.   Exercise goals:  As is.  Behavioral modification strategies: increasing lean protein intake, decreasing simple carbohydrates, increasing vegetables, and increasing water intake.  Abigail Campos has agreed to follow-up with our clinic in 4 weeks. She was informed of the importance of frequent follow-up visits to maximize her success with intensive lifestyle modifications for her multiple health conditions.   Objective:   Blood pressure (!) 143/93, pulse 79, temperature 98.3 F (36.8 C), temperature source Oral, height $RemoveBefo'4\' 11"'LjvLCVkmJnN$  (1.499 m), weight 144 lb (65.3 kg), SpO2 95 %. Body mass index is 29.08 kg/m.  General: Cooperative, alert, well  developed, in no acute distress. HEENT: Conjunctivae and lids unremarkable. Cardiovascular: Regular rhythm.  Lungs: Normal work of breathing. Neurologic: No focal deficits.   Lab Results  Component Value Date    CREATININE 0.97 01/06/2022   BUN 9 01/06/2022   NA 138 01/06/2022   K 3.6 01/06/2022   CL 105 01/06/2022   CO2 27 01/06/2022   Lab Results  Component Value Date   ALT 26 11/24/2021   AST 18 11/24/2021   ALKPHOS 85 11/24/2021   BILITOT 0.6 11/24/2021   Lab Results  Component Value Date   HGBA1C 5.6 11/24/2021   HGBA1C 5.8 05/20/2021   HGBA1C 6.0 11/19/2020   HGBA1C 6.1 (H) 08/31/2020   HGBA1C 5.7 02/14/2020   Lab Results  Component Value Date   INSULIN 11.5 06/25/2021   Lab Results  Component Value Date   TSH 0.737 06/11/2021   Lab Results  Component Value Date   CHOL 127 06/25/2021   HDL 51 06/25/2021   LDLCALC 64 06/25/2021   TRIG 53 06/25/2021   CHOLHDL 2.5 06/25/2021   Lab Results  Component Value Date   VD25OH 30.82 11/24/2021   VD25OH 33.3 06/25/2021   VD25OH 17.70 (L) 11/19/2020   Lab Results  Component Value Date   WBC 4.2 01/06/2022   HGB 12.4 01/06/2022   HCT 38.6 01/06/2022   MCV 94.6 01/06/2022   PLT 174 01/06/2022   Lab Results  Component Value Date   IRON 97 12/26/2020   TIBC 313 12/26/2020   FERRITIN 72 12/26/2020   Attestation Statements:   Reviewed by clinician on day of visit: allergies, medications, problem list, medical history, surgical history, family history, social history, and previous encounter notes.  I, Water quality scientist, CMA, am acting as transcriptionist for Briscoe Deutscher, DO  I have reviewed the above documentation for accuracy and completeness, and I agree with the above. -  Briscoe Deutscher, DO, MS, FAAFP, DABOM - Family and Bariatric Medicine.

## 2022-01-20 NOTE — Therapy (Signed)
[place]. [place], [place], [address] Phone: [phone]   Fax:  [phone]  Physical Therapy Treatment  Patient Details  Name: Abigail Campos MRN: [id number] Date of Birth: [birth year] Referring Provider (PT): Dr Letta Pate   Encounter Date: 01/20/2022   PT End of Session - 01/20/22 1151     Visit Number 18    Number of Visits 22    Date for PT Re-Evaluation 01/29/22    Authorization Type [employer] Medicare    Progress Note Due on Visit 20    PT Start Time 1102    PT Stop Time 1142    PT Time Calculation (min) 40 min    Activity Tolerance Patient tolerated treatment well    Behavior During Therapy [facility] for tasks assessed/performed             Past Medical History:  Diagnosis Date   Anemia    Anxiety and depression    Back pain    Essential hypertension 10/19/2007   03-2010: metoprolol changed to bystolic (was not feeling well on it, no specific allergy or reaction)     Food allergy    Hemiplegia ([place])    Hyperlipemia    Hypertension    Joint pain    Migraine    Neuromuscular disorder ([place])    left sided hemiplegia - has brace for l leg and walks with a cane   Obesity    Ovarian cancer ([place])    Prediabetes    Sleep apnea    not wearing c-pap yet   Sleep apnea    Stroke ([place]) 2017   Vitamin D deficiency     Past Surgical History:  Procedure Laterality Date   ABDOMINAL HYSTERECTOMY  11-14-07   no oophorectomy per surgical report   CESAREAN SECTION     [id number]   INGUINAL HERNIA REPAIR     2004    There were no vitals filed for this visit.   Subjective Assessment - 01/20/22 1105     Subjective My daughter is close to having her baby, we are all very excited. I'd like to be able to come to therapy still even after the baby comes.    Pertinent History PMH: HTN, hx of CVA (2017), ovarian cancer)    Patient Stated Goals Stand longer and better, [facility] wihtout a cane, but wearing brace.     Currently in Pain? No/denies                [facility] PT Assessment - 01/20/22 0001       Observation/Other Assessments   Observations BP 138/105 HR 84 on first attempt; 143/107 HR 84; EOS 147/109 HR 83   seated R UE                          [facility] Adult PT Treatment/Exercise - 01/20/22 0001       Knee/Hip Exercises: Supine   Bridges Both;1 set;15 reps    Bridges Limitations MinA for good alignment LLE    Other Supine Knee/Hip Exercises TA sets 1x10; TA sets with single leg march 1x10 B (MinA for motor control  L LE); supine L hip ABD 1x15 with MinA for good alignment/to reduce hip ER;SAQs with MinA for good form L LE no weight besides AFO                     PT Education - 01/20/22 1147  Education Details relevance of elevated BP numbers, recheck at home in a couple of hours and call PCP if it is still elevated; if BP is this high or greater and/or she develops visual sx, HA, general malaise she is to go to the ED; reasoning for lighter session and avoiding valsalva maneuver    Person(s) Educated Patient    Methods Explanation    Comprehension Verbalized understanding              PT Short Term Goals - 12/18/21 1721       PT SHORT TERM GOAL #1   Title Patient will be I with basic HEP    Time 1    Period Weeks    Status Achieved               PT Long Term Goals - 12/18/21 1721       PT LONG TERM GOAL #1   Title Patient to be independent with final HEP for improved functional mobility, decreased fall risk.    Time 6    Period Weeks    Status On-going      PT LONG TERM GOAL #2   Title Decrease time for 5 x sit to stand test to < 20 seconds.    Baseline 1/5- 21    Time 8    Period Weeks    Status On-going      PT LONG TERM GOAL #3   Title Pt will decr TUG time to 20 seconds or less with SPC with quad tip in order to demo decr fall risk.    Baseline 1/5- 19    Time 8    Period Weeks    Status Achieved      PT LONG  TERM GOAL #4   Title Pt will ambulate at least 300' over indoor level and outdoor unlevel surfaces with LRAD with supervision in order to demo improved community mobility.    Baseline 1/5- has been able to walk over gravel with cane about 71ft; even surfaces no limits with cane    Time 8    Period Weeks    Status On-going    Target Date 12/24/21                   Plan - 01/20/22 1152     Clinical Impression Statement Abigail Campos arrives today doing well, just nervous and excited as her daughter is getting close to having the baby. Checked BP out of caution, it was a bit high this morning at 138/105 HR 84, second attempt 143/107 HR 84 but asymptomatic. Included more rest breaks and avoided vigorous activities today, did encourage her to f/u with her PCP due to ongoing issues with hypertension. Focused on light strengthening and motor control L LE.  Also provided ongoing education on avoiding Valsalva technique during exercises and general activity today to help reduce BP. BP at EOS 147/109 HR 83, still asymptomatic- I did ask her to recheck her pressure at home in a couple hours and then call her PCP, if it stays this high or greater, or she starts to develop symptoms I educated that she should go to the ED.    Personal Factors and Comorbidities Comorbidity 3+;Time since onset of injury/illness/exacerbation;Fitness;Past/Current Experience    Comorbidities HTN, hx of CVA (2017), ovarian cancer    Examination-Activity Limitations Locomotion Level;Stand;Transfers;Caring for Others    Examination-Participation Restrictions Community Activity    Stability/Clinical Decision Making Stable/Uncomplicated    Clinical Decision Making Low  Rehab Potential Good    PT Frequency 2x / week    PT Duration 6 weeks    PT Treatment/Interventions ADLs/Self Care Home Management;Aquatic Therapy;DME Instruction;Gait training;Functional mobility training;Therapeutic activities;Stair training;Therapeutic  exercise;Balance training;Neuromuscular re-education;Orthotic Fit/Training;Patient/family education;Passive range of motion;Dry needling;Manual techniques;Iontophoresis 4mg /ml Dexamethasone;Electrical Stimulation    PT Next Visit Plan continue working on gait training, motor control and strength R LE. Work in core strength as well per pt request. How is BP doing?    PT Home Exercise Plan Access Code: [email or web address]    Consulted and Agree with Plan of Care Patient             Patient will benefit from skilled therapeutic intervention in order to improve the following deficits and impairments:  Abnormal gait, Decreased coordination, Decreased range of motion, Difficulty walking, Increased fascial restricitons, Impaired tone, Decreased endurance, Increased muscle spasms, Decreased activity tolerance, Pain, Decreased balance, Impaired flexibility, Improper body mechanics, Decreased mobility, Decreased strength, Impaired sensation, Postural dysfunction  Visit Diagnosis: Abnormality of gait  Muscle weakness (generalized)  Other symptoms and signs involving the nervous system  History of falling     Problem List Patient Active Problem List   Diagnosis Date Noted   Nausea without vomiting 07/23/2021   Vitamin D deficiency 07/06/2021   History of CVA (cerebrovascular accident) 07/06/2021   Other constipation 03/18/2021   Hot flashes 03/17/2021   Prediabetes 02/11/2021   COVID-19 48/54/6270   Metabolic syndrome 35/00/9381   Neuropathic pain 09/19/2020   Adjustment disorder with mixed anxiety and depressed mood 09/19/2020   Facial droop 08/30/2020   Abnormality of gait 05/09/2020   Hemiplegia of nondominant side, late effect of cerebrovascular disease ([place]) 03/28/2020   Pleural effusion 03/01/2020   Sleep disturbance 02/06/2020   Spastic hemiplegia of left nondominant side due to nontraumatic intraparenchymal hemorrhage of brain ([place]) 12/26/2019   Trigger thumb of right hand 11/02/2019    OSA (obstructive sleep apnea) 07/26/2019   Spastic hemiplegia of left nondominant side as late effect of nontraumatic intraparenchymal hemorrhage of brain (HCC) 04/06/2019   Spastic hemiparesis affecting dominant side ([place]) 01/04/2018   Hyperglycemia 12/29/2016   Depression 12/29/2016   Muscle spasticity    Class 1 obesity with serious comorbidity and body mass index (BMI) of 33.0 to 33.9 in adult 04/08/2016   Hyperlipidemia 04/08/2016   Basal ganglia hemorrhage ([place]) 04/08/2016   Dysphagia as late effect of cerebrovascular disease    Migraine with aura and without status migrainosus, not intractable    Gait disturbance, post-stroke    Hemiplegia, post-stroke (HCC)    Aphasia, post-stroke    Dysarthria, post-stroke    ICH (intracerebral hemorrhage) ([place]) - R basal ganglia due to hypertensive emergency 04/01/2016   Upper airway cough syndrome 12/06/2015   PCP NOTES >>>>>>>>>>>>>>>>>>>>>>>>>>>.. 09/25/2015   Insomnia 12/28/2012   Annual physical exam 11/23/2011   Essential hypertension 10/19/2007   Ann Lions PT, DPT, PN2   Supplemental Physical Therapist [place]. [place], [place], [address] Phone: [phone]   Fax:  [phone]  Name: HARRIS KISTLER MRN: [id number] Date of Birth: 09-04-1971

## 2022-01-22 ENCOUNTER — Encounter: Payer: Self-pay | Admitting: Physical Therapy

## 2022-01-22 ENCOUNTER — Ambulatory Visit: Payer: No Typology Code available for payment source | Admitting: Physical Therapy

## 2022-01-22 ENCOUNTER — Other Ambulatory Visit: Payer: Self-pay

## 2022-01-22 DIAGNOSIS — Z9181 History of falling: Secondary | ICD-10-CM

## 2022-01-22 DIAGNOSIS — R269 Unspecified abnormalities of gait and mobility: Secondary | ICD-10-CM

## 2022-01-22 DIAGNOSIS — R2689 Other abnormalities of gait and mobility: Secondary | ICD-10-CM | POA: Diagnosis not present

## 2022-01-22 DIAGNOSIS — R29818 Other symptoms and signs involving the nervous system: Secondary | ICD-10-CM | POA: Diagnosis not present

## 2022-01-22 DIAGNOSIS — M6281 Muscle weakness (generalized): Secondary | ICD-10-CM

## 2022-01-22 DIAGNOSIS — I69352 Hemiplegia and hemiparesis following cerebral infarction affecting left dominant side: Secondary | ICD-10-CM | POA: Diagnosis not present

## 2022-01-22 DIAGNOSIS — I69359 Hemiplegia and hemiparesis following cerebral infarction affecting unspecified side: Secondary | ICD-10-CM

## 2022-01-22 NOTE — Therapy (Signed)
[place]. [place], [place], [address] Phone: [phone]   Fax:  [phone]  Physical Therapy Treatment  Patient Details  Name: Abigail Campos MRN: [id number] Date of Birth: 18-Mar-1971 Referring Provider (PT): Dr Abigail Campos   Encounter Date: 01/22/2022   PT End of Session - 01/22/22 1254     Visit Number 19    Number of Visits 22    Date for PT Re-Evaluation 01/29/22    Authorization Type A[employer]na Medicare    Progress Note Due on Visit 20    PT Start Time 1147    PT Stop Time 1227    PT Time Calculation (min) 40 min    Activity Tolerance Patient tolerated treatment well    Behavior During Therapy [facility] for tasks assessed/performed             Past Medical History:  Diagnosis Date   Anemia    Anxiety and depression    Back pain    Essential hypertension 10/19/2007   03-2010: metoprolol changed to bystolic (was not feeling well on it, no specific allergy or reaction)     Food allergy    Hemiplegia ([place])    Hyperlipemia    Hypertension    Joint pain    Migraine    Neuromuscular disorder ([place])    left sided hemiplegia - has brace for l leg and walks with a cane   Obesity    Ovarian cancer ([place])    Prediabetes    Sleep apnea    not wearing c-pap yet   Sleep apnea    Stroke ([place]) 2017   Vitamin D deficiency     Past Surgical History:  Procedure Laterality Date   ABDOMINAL HYSTERECTOMY  11-14-07   no oophorectomy per surgical report   CESAREAN SECTION     [phone]   INGUINAL HERNIA REPAIR     2004    There were no vitals filed for this visit.   Subjective Assessment - 01/22/22 1143     Subjective Patient reports her daughter has not had the baby. she has an appointment on 2/17 with Abigail Campos to re-assessed her AFO. She saw her Dr on 1/29, and he updated ehr BP meds at that time.    Pertinent History PMH: HTN, hx of CVA (2017), ovarian cancer)    Currently in Pain? No/denies                                [facility] Adult PT Treatment/Exercise - 01/22/22 0001       Ambulation/Gait   Gait Comments 100' without AD, CGA, able to      Knee/Hip Exercises: Standing   Other Standing Knee Exercises Attempted standing step taps, patien tvery uncomfortable, unable to fully WB through LLE, so placed R foot on 4" step and attepmted forward/back weight shifts, but she was still fearful to weight shift onto LLE.      Knee/Hip Exercises: Seated   Other Seated Knee/Hip Exercises LLE fitter press 2 black cords, 2 x 10 reps.    Sit to Sand 1 set;10 reps;with UE support   elevated mat iwth feet positoned in minimal stride, R out in front to encourage LLE push. Therapsit provided mod A to weight shift onto LLE throughout movement, but she did improve in her ability to activate LLE and also in her control going up and down.  PT Short Term Goals - 12/18/21 1721       PT SHORT TERM GOAL #1   Title Patient will be I with basic HEP    Time 1    Period Weeks    Status Achieved               PT Long Term Goals - 01/22/22 1300       PT LONG TERM GOAL #1   Title Patient to be independent with final HEP for improved functional mobility, decreased fall risk.    Time 1    Period Weeks    Status On-going    Target Date 01/29/22      PT LONG TERM GOAL #2   Title Decrease time for 5 x sit to stand test to < 20 seconds.    Baseline Re-assessment deferred due to BP    Time 1    Period Weeks    Status On-going    Target Date 01/29/22      PT LONG TERM GOAL #3   Title Pt will decr TUG time to 20 seconds or less with SPC with quad tip in order to demo decr fall risk.    Period Weeks    Status Achieved      PT LONG TERM GOAL #4   Title Pt will ambulate at least 300' over indoor level and outdoor unlevel surfaces with LRAD with supervision in order to demo improved community mobility.    Baseline 1/5- has been able to walk over gravel  with cane about 53ft; even surfaces no limits with cane    Time 8    Period Weeks    Status Achieved    Target Date 12/24/21                   Plan - 01/22/22 1255     Clinical Impression Statement Abigail Campos still waiting for arrival of her grandaughter. She did go to Urgent Care after treatment, then went to see her Dr after being screened for HA or stroke and found (-). Her BP remained elevatedtoday, at 141/91. After deep breathing/relaxation, 135/109. It did rise again after the actiivty to 146/109. She reports that her Dr started her on some new BP meds and that at home, her BP is lower. She acknowledged taht she has usually been resting when taking her BP at home, so therapist encouraged her to assess BP after activity such as walking or getting washed and dressed in the am. If it is high, call her Dr. and let him know of the readings. Treatment modified to minimize breath holding maneuvers.    Personal Factors and Comorbidities Comorbidity 3+;Time since onset of injury/illness/exacerbation;Fitness;Past/Current Experience    Comorbidities HTN, hx of CVA (2017), ovarian cancer    Examination-Activity Limitations Locomotion Level;Stand;Transfers;Caring for Others    Examination-Participation Restrictions Community Activity    Stability/Clinical Decision Making Stable/Uncomplicated    Clinical Decision Making Low    Rehab Potential Good    PT Frequency 2x / week    PT Duration Other (comment)   1w   PT Treatment/Interventions ADLs/Self Care Home Management;Aquatic Therapy;DME Instruction;Gait training;Functional mobility training;Therapeutic activities;Stair training;Therapeutic exercise;Balance training;Neuromuscular re-education;Orthotic Fit/Training;Patient/family education;Passive range of motion;Dry needling;Manual techniques;Iontophoresis 4mg /ml Dexamethasone;Electrical Stimulation    PT Next Visit Plan continue working on gait training, motor control and strength R LE. Work in core  strength as well per pt request. How is BP doing?    PT Home Exercise Plan Access Code: [email or web address]  Consulted and Agree with Plan of Care Patient             Patient will benefit from skilled therapeutic intervention in order to improve the following deficits and impairments:  Abnormal gait, Decreased coordination, Decreased range of motion, Difficulty walking, Increased fascial restricitons, Impaired tone, Decreased endurance, Increased muscle spasms, Decreased activity tolerance, Pain, Decreased balance, Impaired flexibility, Improper body mechanics, Decreased mobility, Decreased strength, Impaired sensation, Postural dysfunction  Visit Diagnosis: Abnormality of gait  Muscle weakness (generalized)  Other symptoms and signs involving the nervous system  History of falling  Hemiplegia and hemiparesis following cerebral infarction affecting left dominant side (HCC)  Other abnormalities of gait and mobility  Hemiplegia, post-stroke [facility])     Problem List Patient Active Problem List   Diagnosis Date Noted   Nausea without vomiting 07/23/2021   Vitamin D deficiency 07/06/2021   History of CVA (cerebrovascular accident) 07/06/2021   Other constipation 03/18/2021   Hot flashes 03/17/2021   Prediabetes 02/11/2021   COVID-19 28/76/8115   Metabolic syndrome 72/62/0355   Neuropathic pain 09/19/2020   Adjustment disorder with mixed anxiety and depressed mood 09/19/2020   Facial droop 08/30/2020   Abnormality of gait 05/09/2020   Hemiplegia of nondominant side, late effect of cerebrovascular disease (HCC) 03/28/2020   Pleural effusion 03/01/2020   Sleep disturbance 02/06/2020   Spastic hemiplegia of left nondominant side due to nontraumatic intraparenchymal hemorrhage of brain ([place]) 12/26/2019   Trigger thumb of right hand 11/02/2019   OSA (obstructive sleep apnea) 07/26/2019   Spastic hemiplegia of left nondominant side as late effect of nontraumatic intraparenchymal  hemorrhage of brain (HCC) 04/06/2019   Spastic hemiparesis affecting dominant side (HCC) 01/04/2018   Hyperglycemia 12/29/2016   Depression 12/29/2016   Muscle spasticity    Class 1 obesity with serious comorbidity and body mass index (BMI) of 33.0 to 33.9 in adult 04/08/2016   Hyperlipidemia 04/08/2016   Basal ganglia hemorrhage ([place]) 04/08/2016   Dysphagia as late effect of cerebrovascular disease    Migraine with aura and without status migrainosus, not intractable    Gait disturbance, post-stroke    Hemiplegia, post-stroke (HCC)    Aphasia, post-stroke    Dysarthria, post-stroke    ICH (intracerebral hemorrhage) ([place]) - R basal ganglia due to hypertensive emergency 04/01/2016   Upper airway cough syndrome 12/06/2015   PCP NOTES >>>>>>>>>>>>>>>>>>>>>>>>>>>.. 09/25/2015   Insomnia 12/28/2012   Annual physical exam 11/23/2011   Essential hypertension 10/19/2007    Marcelina Morel, DPT 01/22/2022, 1:02 PM  [place]. [place], [place], [address] Phone: [phone]   Fax:  [phone]  Name: Abigail Campos MRN: [id number] Date of Birth: 1971-04-24

## 2022-01-23 ENCOUNTER — Ambulatory Visit
Admission: RE | Admit: 2022-01-23 | Discharge: 2022-01-23 | Disposition: A | Discharge status: Home / Self Care | Payer: No Typology Code available for payment source | Source: Ambulatory Visit | Attending: Internal Medicine | Admitting: Internal Medicine

## 2022-01-23 DIAGNOSIS — Z1231 Encounter for screening mammogram for malignant neoplasm of breast: Secondary | ICD-10-CM

## 2022-01-23 IMAGING — MG MM DIGITAL SCREENING BILAT W/ TOMO AND CAD
6 of 10 series · 6 of 30 positions shown · non-contrast
Comparison: Previous exam(s).

CLINICAL DATA: Screening.

EXAM:
DIGITAL SCREENING BILATERAL MAMMOGRAM WITH TOMOSYNTHESIS AND CAD
TECHNIQUE: Bilateral screening digital craniocaudal and mediolateral oblique
mammograms were obtained. Bilateral screening digital breast
tomosynthesis was performed. The images were evaluated with
computer-aided detection.

[R MLO synth-2D]
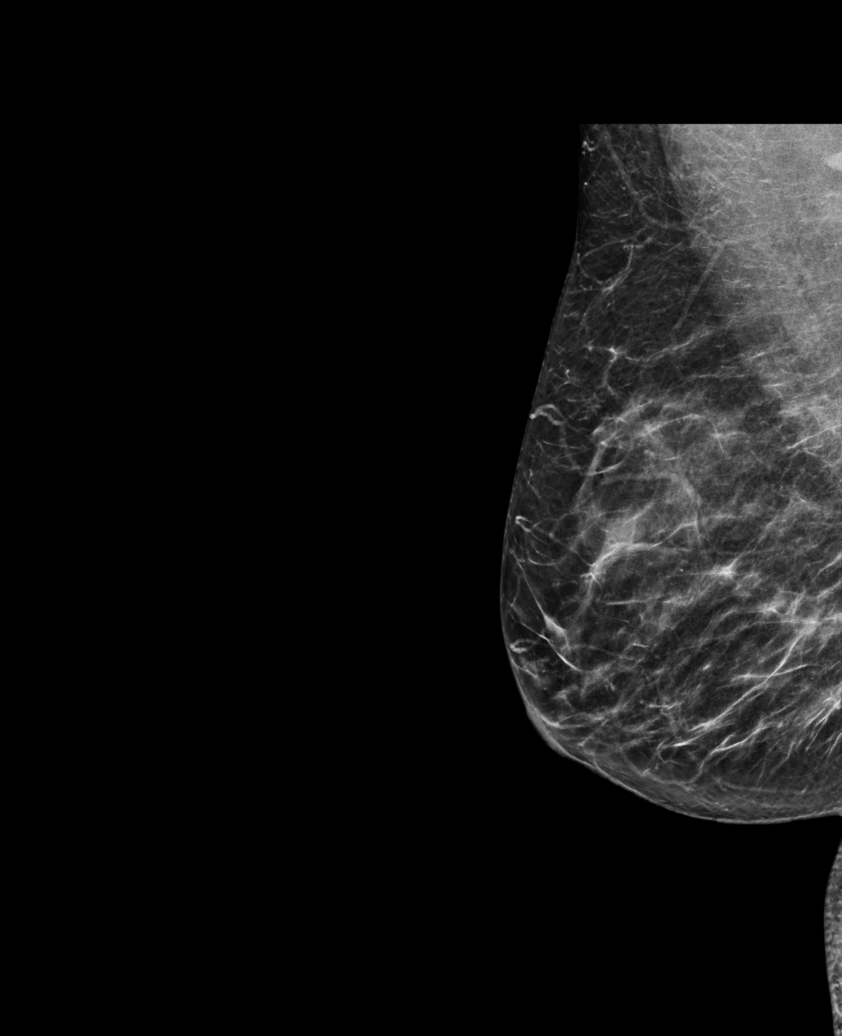

[L ML synth-2D]
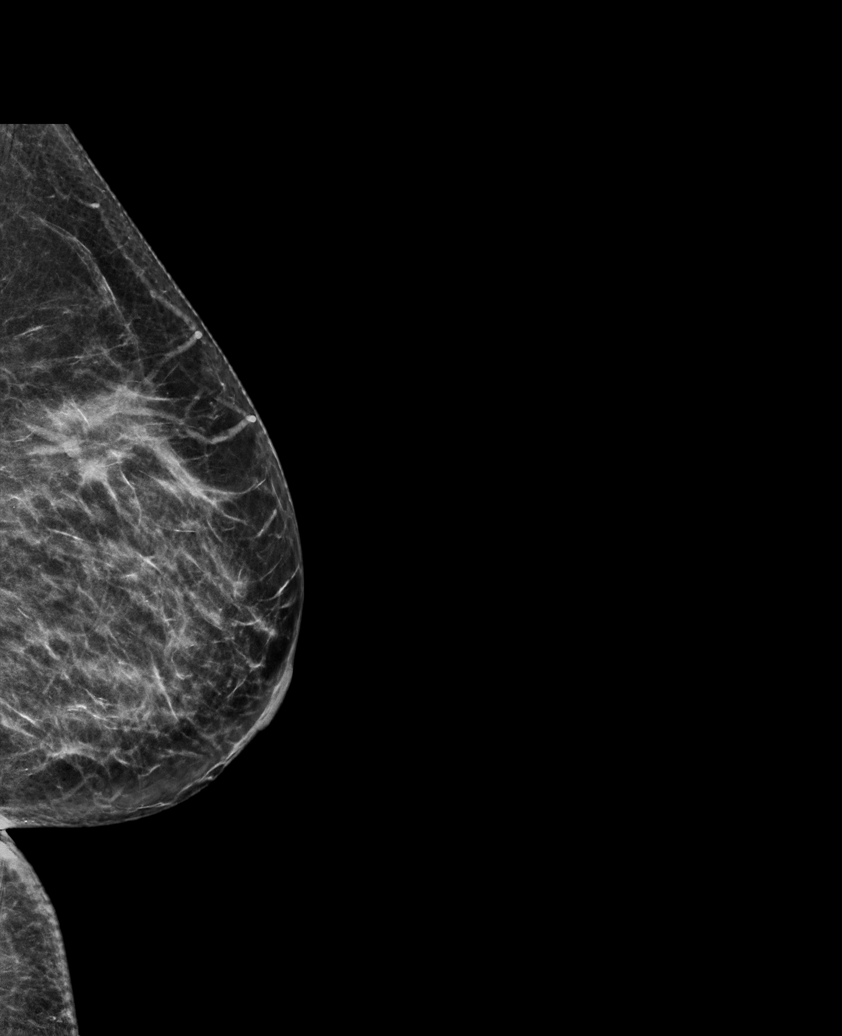

[L MLO synth-2D]
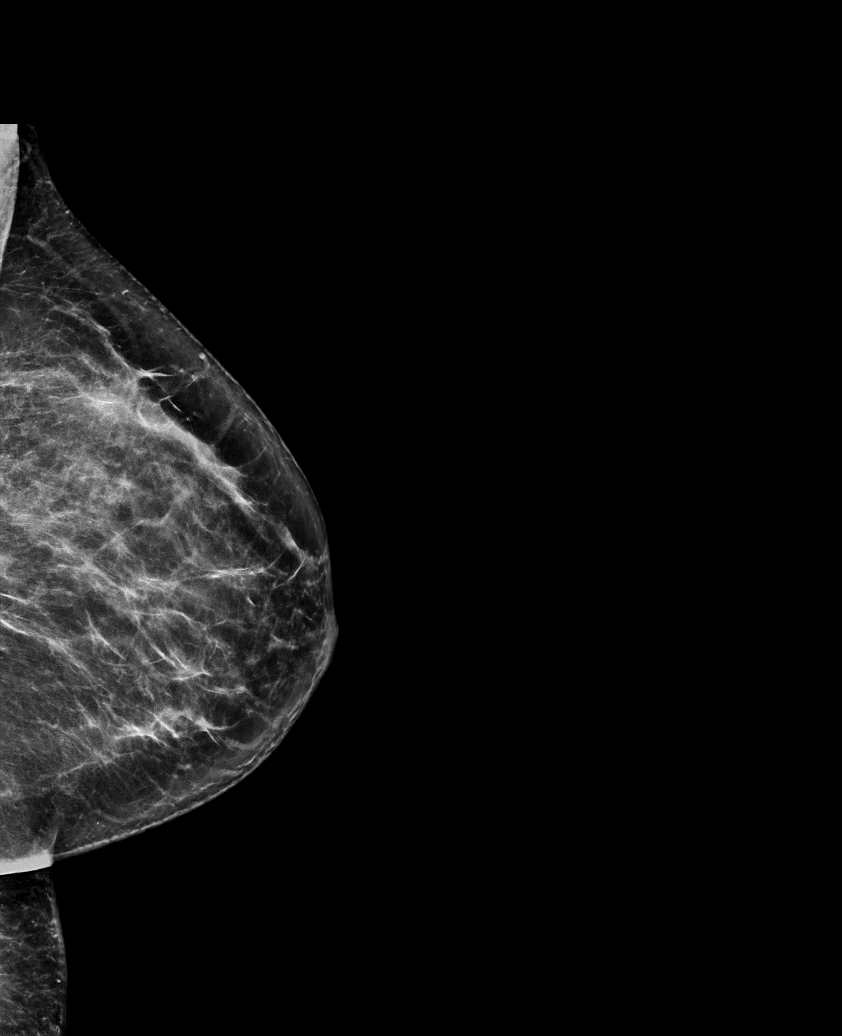

[R CC synth-2D]
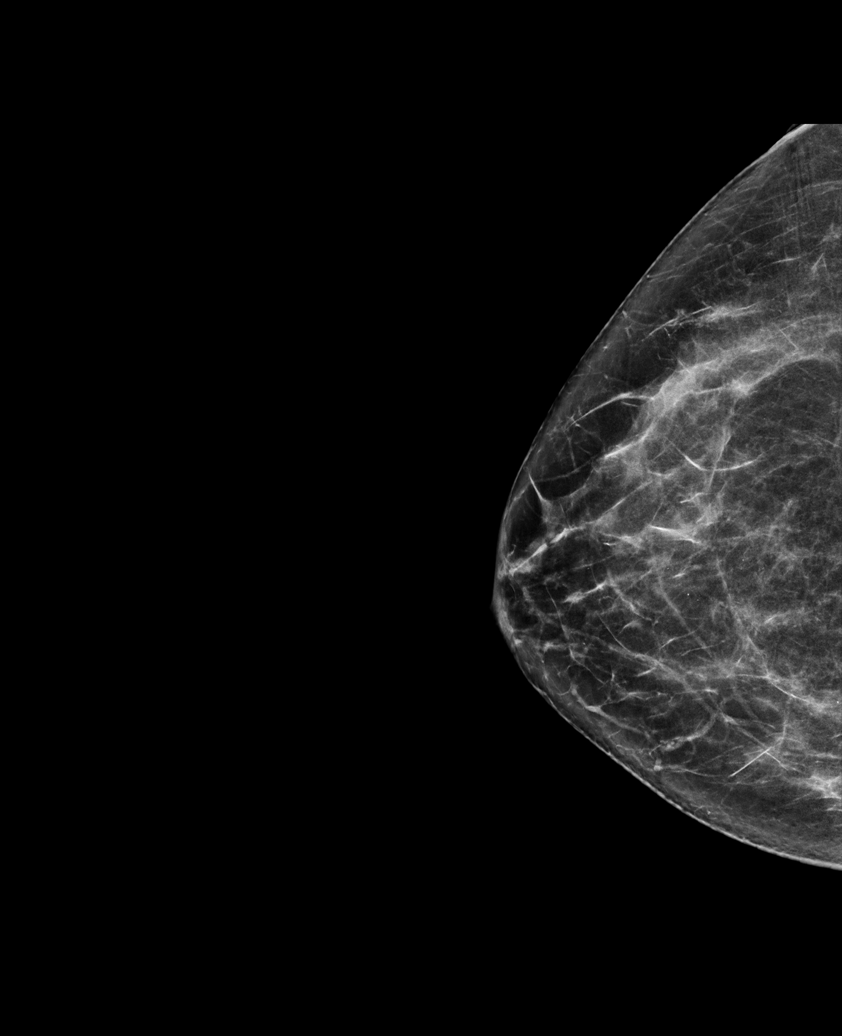

[L CC synth-2D]
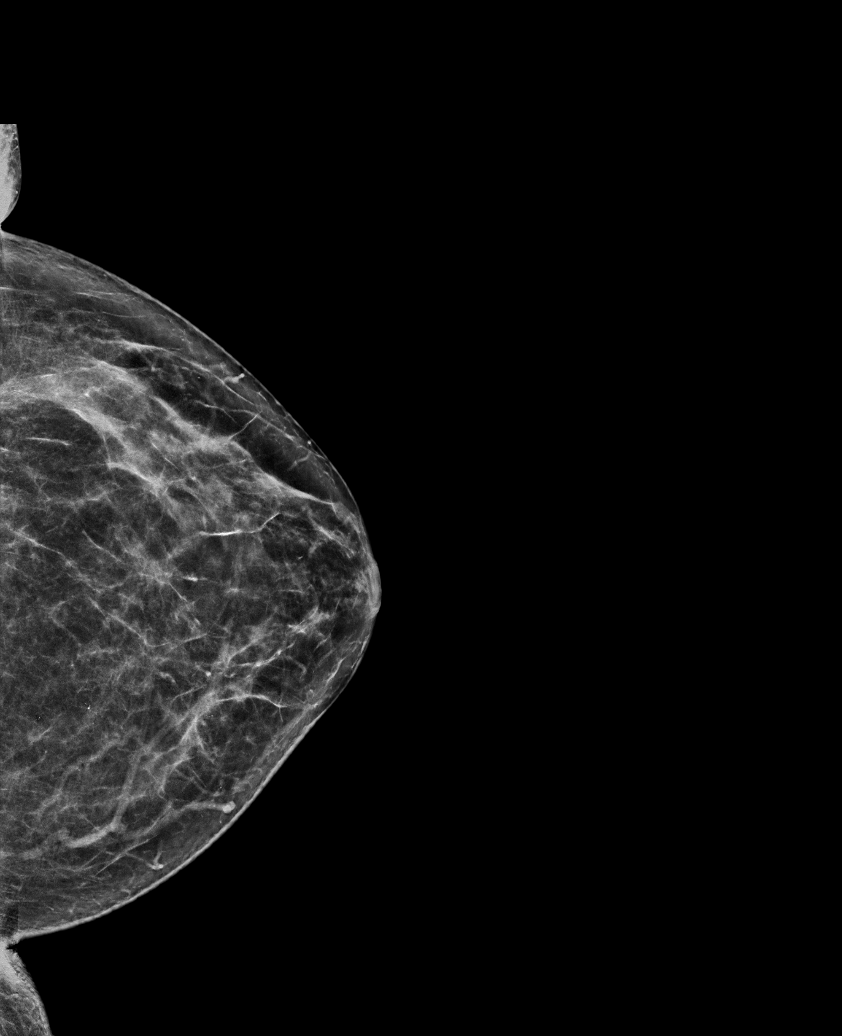

[L MLO tomo · tomo slice 35/70.0]
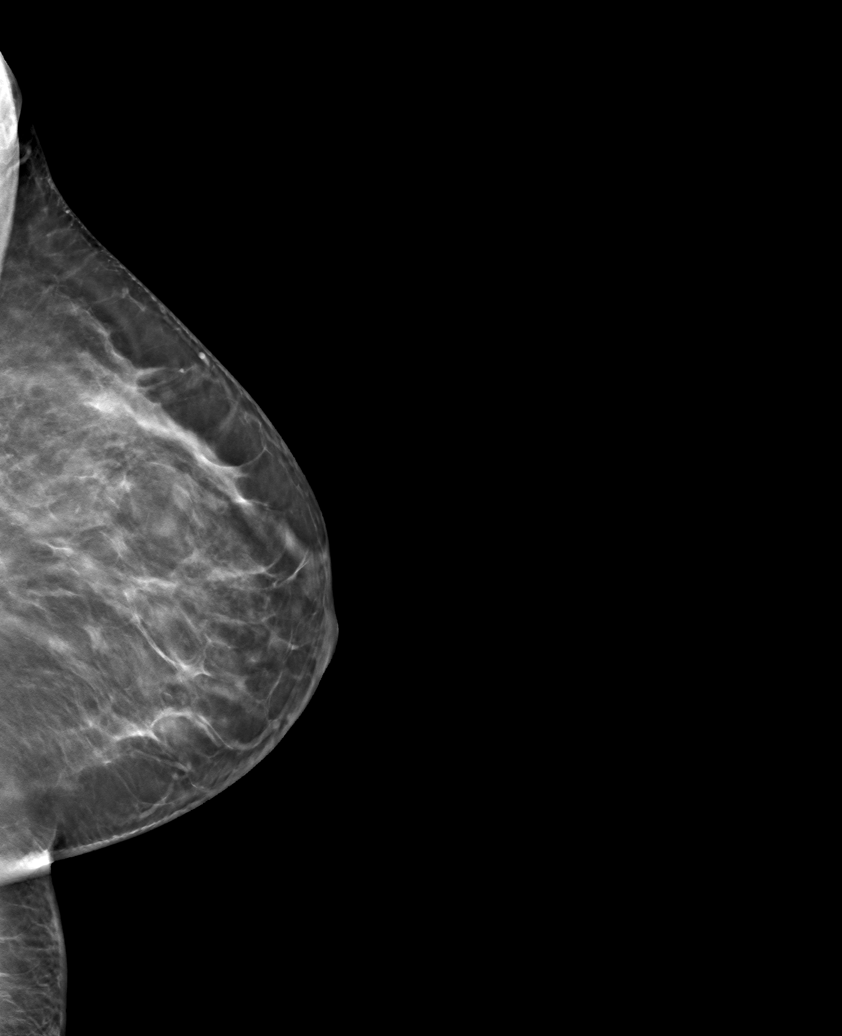

[6 of 30 positions shown; findings below may reference images not displayed]

ACR Breast Density Category b: There are scattered areas of
fibroglandular density.
FINDINGS: In the left breast, a possible focal asymmetry warrants further
evaluation. In the right breast, no findings suspicious for
malignancy.
IMPRESSION: Further evaluation is suggested for a possible focal asymmetry in
the left breast.

RECOMMENDATION:
Diagnostic mammogram and possibly ultrasound of the left breast.
(Code:[08])

The patient will be contacted regarding the findings, and additional
imaging will be scheduled.

BI-RADS CATEGORY  0: Incomplete. Need additional imaging evaluation
and/or prior mammograms for comparison.

## 2022-01-25 ENCOUNTER — Other Ambulatory Visit: Payer: Self-pay | Admitting: Physical Medicine & Rehabilitation

## 2022-01-25 ENCOUNTER — Other Ambulatory Visit: Payer: Self-pay | Admitting: Internal Medicine

## 2022-01-26 ENCOUNTER — Other Ambulatory Visit: Payer: Self-pay | Admitting: Internal Medicine

## 2022-01-26 ENCOUNTER — Ambulatory Visit (INDEPENDENT_AMBULATORY_CARE_PROVIDER_SITE_OTHER): Payer: No Typology Code available for payment source | Admitting: Internal Medicine

## 2022-01-26 ENCOUNTER — Encounter: Payer: Self-pay | Admitting: Internal Medicine

## 2022-01-26 VITALS — BP 146/102 | HR 75 | Temp 98.0°F | Resp 18 | Ht 59.0 in | Wt 138.0 lb

## 2022-01-26 DIAGNOSIS — I10 Essential (primary) hypertension: Secondary | ICD-10-CM | POA: Diagnosis not present

## 2022-01-26 DIAGNOSIS — R928 Other abnormal and inconclusive findings on diagnostic imaging of breast: Secondary | ICD-10-CM

## 2022-01-26 MED ORDER — LOSARTAN POTASSIUM 50 MG PO TABS: 50.0000 mg | ORAL_TABLET | Freq: Every day | ORAL | 0 refills | Status: DC

## 2022-01-26 NOTE — Patient Instructions (Signed)
Stop lisinopril 2.5 mg.  Start losartan 50 mg 1 tablet a day.  Check the  blood pressure regularly  BP GOAL is between 110/65 and  135/85. If your blood pressure is more than 165/105 let me know. Send me a message with your blood pressure daily readings next week    GO TO [place], [place] back for blood work in 2 weeks  Come back for a checkup in 3 months

## 2022-01-26 NOTE — Progress Notes (Signed)
Subjective:    Patient ID: Abigail Campos, female    DOB: 05/23/1971, 51 y.o.   MRN: [id number]  DOS:  01/26/2022 Type of visit - description: f/u  Went to the ER 01/06/2022 after physical therapy noted elevated BP, patient was somewhat fatigued and feeling "off". No chest pain, headache or other acute issues. At the ER, BP was 157/103. BMP and CBC were okay.    Since then, her BP remains slightly elevated. Denies any symptoms.  Emotionally is doing well  Review of Systems See above   Past Medical History:  Diagnosis Date   Anemia    Anxiety and depression    Back pain    Essential hypertension 10/19/2007   03-2010: metoprolol changed to bystolic (was not feeling well on it, no specific allergy or reaction)     Food allergy    Hemiplegia (HCC)    Hyperlipemia    Hypertension    Joint pain    Migraine    Neuromuscular disorder ([place])    left sided hemiplegia - has brace for l leg and walks with a cane   Obesity    Ovarian cancer ([place])    Prediabetes    Sleep apnea    not wearing c-pap yet   Sleep apnea    Stroke ([place]) 2017   Vitamin D deficiency     Past Surgical History:  Procedure Laterality Date   ABDOMINAL HYSTERECTOMY  11-14-07   no oophorectomy per surgical report   CESAREAN SECTION     [id number]   INGUINAL HERNIA REPAIR     2004    Current Outpatient Medications  Medication Instructions   atorvastatin (LIPITOR) 20 MG tablet TAKE 1 TABLET BY MOUTH EVERY DAY   baclofen (LIORESAL) 30 mg, Oral, 3 times daily   buPROPion (WELLBUTRIN) 100 MG tablet TAKE 1 TABLET BY MOUTH TWICE A DAY   carvedilol (COREG) 12.5 mg, Oral, 2 times daily with meals   clopidogrel (PLAVIX) 75 MG tablet TAKE 1 TABLET BY MOUTH EVERY DAY   escitalopram (LEXAPRO) 20 MG tablet TAKE 1 TABLET BY MOUTH EVERY DAY   gabapentin (NEURONTIN) 300 MG capsule TAKE 1 CAPSULE BY MOUTH THREE TIMES A DAY   losartan (COZAAR) 50 mg, Oral, Daily   tirzepatide (MOUNJARO) 5 mg, Subcutaneous, Weekly    traZODone (DESYREL) 100 mg, Oral, At bedtime PRN       Objective:   Physical Exam BP (!) 146/102 (BP Location: Right Arm, Patient Position: Sitting, Cuff Size: Small)    Pulse 75    Temp 98 F (36.7 C) (Oral)    Resp 18    Ht 4\' 11"  (1.499 m)    Wt 138 lb (62.6 kg)    SpO2 98%    BMI 27.87 kg/m  General:   Well developed, NAD, BMI noted. HEENT:  Normocephalic . Face symmetric, atraumatic Lungs:  CTA B Normal respiratory effort, no intercostal retractions, no accessory muscle use. Heart: RRR,  no murmur.  Lower extremities: no pretibial edema bilaterally  Skin: Not pale. Not jaundice Neurologic:  alert & oriented X3.  Speech normal, gait: At baseline, consistent with history of a stroke Psych--  Cognition and judgment appear intact.  Cooperative with normal attention span and concentration.  Behavior appropriate. No anxious or depressed appearing.      Assessment      Assessment   Prediabetes: A1c 6.0 (April 2017)  HTN Hyperlipidemia Depression, insomnia GERD Stroke 03-2016: [place] (L)  spastic hemiplegia   ICH, right  basilar ganglia due to HTN emergency, + encephalopathy, + dysphagia, aphasia, dysarthria Residual L spasticity.  CTA neck 03-2016 neg CTA head 10-17 (-) aneurysm Headaches , migraines dx after 2nd pregnancy, (-) CT head 2014 and 2015 Cough, persisting, saw Dr. Melvyn Novas 11-2015 OSA: Sleep study 08-2019, saw Dr. Annamaria Boots, intolerant to CPAP  COVID infection 01-2021   PLAN HTN: BP has not been well controlled lately.  Went to the ER 1 time, labs were normal. Increased carvedilol to 12.5 mg twice daily.  BP this morning at home 157/118. She reports no excessive sodium, no NSAIDs. Plan:  Continue increased dose of  carvedilol, switch lisinopril 2.5 mg to losartan 50 mg. Asked pt to send me BP readings next week.  Consider increase losartan to 100 mg daily. Labs in 2 weeks. L clavicle fracture: Happened at 10/31/2021, few days after she was seen for a physical.   Follow-up by Ortho.  Pain is not severe at this point Depression insomnia: See last visit, since then she is doing better emotionally.  To have her first grandbaby soon. RTC labs 2 weeks RTC office visit 3 months   This visit occurred during the SARS-CoV-2 public health emergency.  Safety protocols were in place, including screening questions prior to the visit, additional usage of staff PPE, and extensive cleaning of exam room while observing appropriate contact time as indicated for disinfecting solutions.

## 2022-01-27 NOTE — Assessment & Plan Note (Signed)
HTN: BP has not been well controlled lately.  Went to the ER 1 time, labs were normal. Increased carvedilol to 12.5 mg twice daily.  BP this morning at home 157/118. She reports no excessive sodium, no NSAIDs. Plan:  Continue increased dose of  carvedilol, switch lisinopril 2.5 mg to losartan 50 mg. Asked pt to send me BP readings next week.  Consider increase losartan to 100 mg daily. Labs in 2 weeks. L clavicle fracture: Happened at 10/31/2021, few days after she was seen for a physical.  Follow-up by Ortho.  Pain is not severe at this point Depression insomnia: See last visit, since then she is doing better emotionally.  To have her first grandbaby soon. RTC labs 2 weeks RTC office visit 3 months

## 2022-01-28 ENCOUNTER — Encounter: Payer: Self-pay | Admitting: Physical Therapy

## 2022-01-28 ENCOUNTER — Other Ambulatory Visit: Payer: Self-pay

## 2022-01-28 ENCOUNTER — Ambulatory Visit: Payer: No Typology Code available for payment source | Admitting: Physical Therapy

## 2022-01-28 DIAGNOSIS — M6281 Muscle weakness (generalized): Secondary | ICD-10-CM | POA: Diagnosis not present

## 2022-01-28 DIAGNOSIS — Z9181 History of falling: Secondary | ICD-10-CM

## 2022-01-28 DIAGNOSIS — R269 Unspecified abnormalities of gait and mobility: Secondary | ICD-10-CM | POA: Diagnosis not present

## 2022-01-28 DIAGNOSIS — R29818 Other symptoms and signs involving the nervous system: Secondary | ICD-10-CM | POA: Diagnosis not present

## 2022-01-28 DIAGNOSIS — R2689 Other abnormalities of gait and mobility: Secondary | ICD-10-CM | POA: Diagnosis not present

## 2022-01-28 DIAGNOSIS — I69352 Hemiplegia and hemiparesis following cerebral infarction affecting left dominant side: Secondary | ICD-10-CM | POA: Diagnosis not present

## 2022-01-28 NOTE — Therapy (Signed)
[facility]. [place], [place], [address] Phone: [phone]   Fax:  [phone]  Physical Therapy Treatment  Patient Details  Name: Abigail Campos MRN: [id number] Date of Birth: February 13, 1971 Referring Provider (PT): Dr Letta Pate   Encounter Date: 01/28/2022  Progress Note Reporting Period 12/18/21 to 01/28/22  See note below for Objective Data and Assessment of Progress/Goals.    PHYSICAL THERAPY DISCHARGE SUMMARY  Visits from Start of Care: 20  Current functional level related to goals / functional outcomes: Has maximized benefit from skilled PT services- DC.    Remaining deficits: See below    Education / Equipment: See below    Patient agrees to discharge. Patient goals were partially met. Patient is being discharged due to maximized rehab potential.        PT End of Session - 01/28/22 1154     Visit Number 20    Number of Visits 20    Authorization Type [employer]    Progress Note Due on Visit 20    PT Start Time 1104    PT Stop Time 1142    PT Time Calculation (min) 38 min    Activity Tolerance Patient tolerated treatment well    Behavior During Therapy WFL for tasks assessed/performed             Past Medical History:  Diagnosis Date   Anemia    Anxiety and depression    Back pain    Essential hypertension 10/19/2007   03-2010: metoprolol changed to bystolic (was not feeling well on it, no specific allergy or reaction)     Food allergy    Hemiplegia (HCC)    Hyperlipemia    Hypertension    Joint pain    Migraine    Neuromuscular disorder ([place])    left sided hemiplegia - has brace for l leg and walks with a cane   Obesity    Ovarian cancer ([place])    Prediabetes    Sleep apnea    not wearing c-pap yet   Sleep apnea    Stroke ([place]) 2017   Vitamin D deficiency     Past Surgical History:  Procedure Laterality Date   ABDOMINAL HYSTERECTOMY  11-14-07   no oophorectomy  per surgical report   CESAREAN SECTION     [id number]   INGUINAL HERNIA REPAIR     2004    There were no vitals filed for this visit.   Subjective Assessment - 01/28/22 1107     Subjective My pressure has been high, my PCP changed my blood pressure meds but its still up there. Daughter is being induced this afternoon. No falls or anything.    Pertinent History PMH: HTN, hx of CVA (2017), ovarian cancer)    Patient Stated Goals Stand longer and better, [facility] wihtout a cane, but wearing brace.    Currently in Pain? No/denies                [facility] PT Assessment - 01/28/22 0001       Strength   Right Hip Flexion 3+/5    Right Hip Extension 3-/5    Right Hip ABduction 4/5    Left Hip Flexion 3-/5    Left Hip Extension 2/5    Left Hip ABduction 3/5    Right Knee Flexion 5/5    Right Knee Extension 4/5    Left Knee Flexion 2+/5    Left Knee Extension 4+/5  Right Ankle Dorsiflexion 5/5    Left Ankle Dorsiflexion 1/5    Left Ankle Plantar Flexion 1/5      Berg Balance Test   Sit to Stand Able to stand without using hands and stabilize independently    Standing Unsupported Able to stand safely 2 minutes    Sitting with Back Unsupported but Feet Supported on Floor or Stool Able to sit safely and securely 2 minutes    Stand to Sit Sits safely with minimal use of hands    Transfers Able to transfer safely, minor use of hands    Standing Unsupported with Eyes Closed Able to stand 10 seconds safely    Standing Unsupported with Feet Together Able to place feet together independently and stand for 1 minute with supervision    From Standing, Reach Forward with Outstretched Arm Can reach forward >12 cm safely (5")    From Standing Position, Pick up Object from Floor Able to pick up shoe safely and easily    From Standing Position, Turn to Look Behind Over each Shoulder Looks behind one side only/other side shows less weight shift    Turn 360 Degrees Able to turn 360 degrees safely but  slowly    Standing Unsupported, Alternately Place Feet on Step/Stool Needs assistance to keep from falling or unable to try    Standing Unsupported, One Foot in Front Able to take small step independently and hold 30 seconds    Standing on One Leg Tries to lift leg/unable to hold 3 seconds but remains standing independently    Total Score 42      Timed Up and Go Test   Normal TUG (seconds) 20.6    TUG Comments with SPC with quad tip                                    PT Education - 01/28/22 1152     Education Details reassess findings and progress towards goals; importance of ongoing BP monitoring at rest and with activity- should not see diastolic pressure above 650 even with exercise typically; recommended floor pedal bike she can use in sitting once BP is under control. Might benefit from new referral to check gait with new AFO, would need to get this from MD.    Person(s) Educated Patient    Methods Explanation    Comprehension Verbalized understanding              PT Short Term Goals - 01/28/22 1155       PT SHORT TERM GOAL #1   Title Patient will be I with basic HEP    Time 1    Period Weeks    Status Achieved      PT SHORT TERM GOAL #2   Time 4    Period Weeks               PT Long Term Goals - 01/28/22 1155       PT LONG TERM GOAL #1   Title Patient to be independent with final HEP for improved functional mobility, decreased fall risk.    Time 1    Period Weeks    Status Partially Met      PT LONG TERM GOAL #2   Title Decrease time for 5 x sit to stand test to < 20 seconds.    Baseline Re-assessment deferred due to BP    Time 1  Period Weeks    Status Deferred      PT LONG TERM GOAL #3   Title Pt will decr TUG time to 20 seconds or less with SPC with quad tip in order to demo decr fall risk.    Baseline 2/15 20 seconds    Time 8    Period Weeks    Status Achieved      PT LONG TERM GOAL #4   Title Pt will ambulate  at least 300' over indoor level and outdoor unlevel surfaces with LRAD with supervision in order to demo improved community mobility.    Time 8    Period Weeks    Status Achieved                   Plan - 01/28/22 1154     Clinical Impression Statement Loma arrives today doing well, excited as her daughter is being induced for delivery later this afternoon. Has still been having issues with elevated BP, which has definitely limited intensity of PT. Took objective measures for progress note as per insurance requirements. Does not show significant progress towards goals at this time- DC for now. Note that she is getting new AFO soon- may benefit from short course of PT with new MD referral after brace is fit to ensure she walking safely. DC for now, thank you for the referral!    Personal Factors and Comorbidities Comorbidity 3+;Time since onset of injury/illness/exacerbation;Fitness;Past/Current Experience    Comorbidities HTN, hx of CVA (2017), ovarian cancer    Examination-Activity Limitations Locomotion Level;Stand;Transfers;Caring for Others    Examination-Participation Restrictions Community Activity    Stability/Clinical Decision Making Stable/Uncomplicated    Clinical Decision Making Low    Rehab Potential Good    PT Frequency Other (comment)   DC today   PT Duration Other (comment)   DC today   PT Treatment/Interventions ADLs/Self Care Home Management;Aquatic Therapy;DME Instruction;Gait training;Functional mobility training;Therapeutic activities;Stair training;Therapeutic exercise;Balance training;Neuromuscular re-education;Orthotic Fit/Training;Patient/family education;Passive range of motion;Dry needling;Manual techniques;Iontophoresis 80m/ml Dexamethasone;Electrical Stimulation    PT Next Visit Plan DC today    PT Home Exercise Plan Access Code: [email or web address]   Consulted and Agree with Plan of Care Patient             Patient will benefit from skilled therapeutic  intervention in order to improve the following deficits and impairments:  Abnormal gait, Decreased coordination, Decreased range of motion, Difficulty walking, Increased fascial restricitons, Impaired tone, Decreased endurance, Increased muscle spasms, Decreased activity tolerance, Pain, Decreased balance, Impaired flexibility, Improper body mechanics, Decreased mobility, Decreased strength, Impaired sensation, Postural dysfunction  Visit Diagnosis: Abnormality of gait  Muscle weakness (generalized)  Other symptoms and signs involving the nervous system  History of falling     Problem List Patient Active Problem List   Diagnosis Date Noted   Nausea without vomiting 07/23/2021   Vitamin D deficiency 07/06/2021   History of CVA (cerebrovascular accident) 07/06/2021   Other constipation 03/18/2021   Hot flashes 03/17/2021   Prediabetes 02/11/2021   COVID-19 062/83/6629  Metabolic syndrome 047/65/4650  Neuropathic pain 09/19/2020   Adjustment disorder with mixed anxiety and depressed mood 09/19/2020   Facial droop 08/30/2020   Abnormality of gait 05/09/2020   Hemiplegia of nondominant side, late effect of [place]vascular disease ([place] 03/28/2020   Pleural effusion 03/01/2020   Sleep disturbance 02/06/2020   Spastic hemiplegia of left nondominant side due to nontraumatic intraparenchymal hemorrhage of brain ([place] 12/26/2019   Trigger thumb of right  hand 11/02/2019   OSA (obstructive sleep apnea) 07/26/2019   Spastic hemiplegia of left nondominant side as late effect of nontraumatic intraparenchymal hemorrhage of brain ([place]) 04/06/2019   Spastic hemiparesis affecting dominant side ([place]) 01/04/2018   Hyperglycemia 12/29/2016   Depression 12/29/2016   Muscle spasticity    Class 1 obesity with serious comorbidity and body mass index (BMI) of 33.0 to 33.9 in adult 04/08/2016   Hyperlipidemia 04/08/2016   Basal ganglia hemorrhage ([place]) 04/08/2016   Dysphagia as late effect of  cerebrovascular disease    Migraine with aura and without status migrainosus, not intractable    Gait disturbance, post-stroke    Hemiplegia, post-stroke (HCC)    Aphasia, post-stroke    Dysarthria, post-stroke    ICH (intracerebral hemorrhage) ([place]) - R basal ganglia due to hypertensive emergency 04/01/2016   Upper airway cough syndrome 12/06/2015   PCP NOTES >>>>>>>>>>>>>>>>>>>>>>>>>>>.. 09/25/2015   Insomnia 12/28/2012   Annual physical exam 11/23/2011   Essential hypertension 10/19/2007   Ann Lions PT, DPT, PN2   Supplemental Physical Therapist [facility]. [place], [place], [address] Phone: [phone]   Fax:  [phone]  Name: ANNASOPHIA CROCKER MRN: [id number] Date of Birth: 07-Aug-1971

## 2022-01-30 ENCOUNTER — Encounter: Payer: No Typology Code available for payment source | Admitting: Physical Medicine & Rehabilitation

## 2022-02-01 ENCOUNTER — Other Ambulatory Visit: Payer: Self-pay | Admitting: Internal Medicine

## 2022-02-03 ENCOUNTER — Other Ambulatory Visit: Payer: Self-pay

## 2022-02-03 ENCOUNTER — Encounter
Payer: No Typology Code available for payment source | Attending: Physical Medicine & Rehabilitation | Admitting: Physical Medicine & Rehabilitation

## 2022-02-03 ENCOUNTER — Encounter: Payer: Self-pay | Admitting: Physical Medicine & Rehabilitation

## 2022-02-03 VITALS — BP 135/97 | HR 85 | Ht 59.0 in | Wt 149.0 lb

## 2022-02-03 DIAGNOSIS — G8114 Spastic hemiplegia affecting left nondominant side: Secondary | ICD-10-CM

## 2022-02-03 NOTE — Patient Instructions (Signed)
Will repeat botulinum toxin injection next visit fo rthe Left forearm, expect the leg injection to last ~4-6 mo

## 2022-02-03 NOTE — Progress Notes (Signed)
Subjective:    Patient ID: Abigail Campos, female    DOB: 07/09/1971, 51 y.o.   MRN: [id number]  HPI  51 year old female with left spastic hemiplegia that is only partially responsive to physical therapy and oral spasticity medication, baclofen 30 mg 3 times daily.  The patient was getting Dysport injections 1500 units to include the left upper and left lower limb.  Treatments were helpful for the left upper limb but not very helpful for the left lower limb.  She was seen in consultation 10/17/2021 and discussed option of phenol neurolysis to the left tibial nerve combined with Dysport injection for the left upper extremity.  On 12/19/2021 the patient underwent left tibial neurolysis with phenol as well as Dysport injection 900 units into the left upper limb.  LLE spasticity improved after phenol tibial neurolysis.  As usual the patient has had good results with the left upper extremity  Signed Cosign: Cosign Not Required Encounter Date: 12/19/2021   Editor: Charlett Blake, MD (Physician)                Phenol neurolysis of the LEFT tibial nerve,     Number of units per muscle Left FCR: 200 units   Left FDS: 400 units Left FDP: 300units Waste 100u Pain Inventory Average Pain 3 Pain Right Now 5 My pain is intermittent and aching  In the last 24 hours, has pain interfered with the following? General activity 1 Relation with others 1 Enjoyment of life 1 What TIME of day is your pain at its worst? night Sleep (in general) Fair  Pain is worse with: walking and some activites Pain improves with: rest Relief from Meds: 2  Family History  Problem Relation Age of Onset   Diabetes Mother    Hypertension Mother    Glaucoma Mother    Colon cancer Mother        mother age 57   Cancer Mother    Anxiety disorder Mother    Obesity Mother    Rectal cancer Father        dx 26   Hypertension Father    Cancer Father    Sleep apnea Father    Alcoholism Father    Obesity Father     Stroke Paternal Grandfather    Stroke Maternal Grandfather    Diabetes Brother    Hypertension Brother    Heart disease Maternal Aunt        x 2 did 2012 CAD-CHF   Diabetes Brother    Hypertension Brother    Lung cancer Maternal Aunt    Breast cancer Neg Hx    Esophageal cancer Neg Hx    Stomach cancer Neg Hx    Social History   Socioeconomic History   Marital status: Married    Spouse name: [name]   Number of children: 2   Years of education: Not on file   Highest education level: Not on file  Occupational History   Occupation: disable d/t stroke TEACHER    Employer: [employer]  Tobacco Use   Smoking status: Never   Smokeless tobacco: Never  Vaping Use   Vaping Use: Never used  Substance and Sexual Activity   Alcohol use: No   Drug use: No   Sexual activity: Not Currently    Birth control/protection: Surgical  Other Topics Concern   Not on file  Social History Narrative   Lives with husband and son (2007)   Left Hand prior to stroke  Drinks 1-2 cups daily   Daughter (1998), married    Social Determinants of Health   Financial Resource Strain: Not on file  Food Insecurity: Not on file  Transportation Needs: Not on file  Physical Activity: Not on file  Stress: Not on file  Social Connections: Not on file   Past Surgical History:  Procedure Laterality Date   ABDOMINAL HYSTERECTOMY  11-14-07   no oophorectomy per surgical report   CESAREAN SECTION     [id number]   INGUINAL HERNIA REPAIR     2004   Past Surgical History:  Procedure Laterality Date   ABDOMINAL HYSTERECTOMY  11-14-07   no oophorectomy per surgical report   CESAREAN SECTION     [id number]   INGUINAL HERNIA REPAIR     2004   Past Medical History:  Diagnosis Date   Anemia    Anxiety and depression    Back pain    Essential hypertension 10/19/2007   03-2010: metoprolol changed to bystolic (was not feeling well on it, no specific allergy or reaction)     Food allergy    Hemiplegia  (HCC)    Hyperlipemia    Hypertension    Joint pain    Migraine    Neuromuscular disorder ([place])    left sided hemiplegia - has brace for l leg and walks with a cane   Obesity    Ovarian cancer ([place])    Prediabetes    Sleep apnea    not wearing c-pap yet   Sleep apnea    Stroke ([place]) 2017   Vitamin D deficiency    BP (!) 135/97    Pulse 85    Ht 4\' 11"  (1.499 m)    Wt 149 lb (67.6 kg)    SpO2 97%    BMI 30.09 kg/m   Opioid Risk Score:   Fall Risk Score:  `1  Depression screen PHQ 2/9  Depression screen [facility] 2/9 12/19/2021 09/30/2021 08/29/2021 07/01/2021 05/20/2021 12/26/2020 11/19/2020  Decreased Interest 1 1 1 1 2  0 1  Down, Depressed, Hopeless 1 1 1 1 2 2 1   PHQ - 2 Score 2 2 2 2 4 2 2   Altered sleeping - 3 - - 3 3 3   Tired, decreased energy - 1 - - 1 3 2   Change in appetite - 0 - - 0 2 2  Feeling bad or failure about yourself  - 1 - - 1 2 2   Trouble concentrating - 3 - - 3 1 0  Moving slowly or fidgety/restless - 0 - - 1 2 1   Suicidal thoughts - 0 - - 0 1 1  PHQ-9 Score - 10 - - 13 16 13   Difficult doing work/chores - - - - Somewhat difficult Somewhat difficult Somewhat difficult  Some recent data might be hidden      Review of Systems  Musculoskeletal:        Left leg pain  All other systems reviewed and are negative.     Objective:   Physical Exam Vitals and nursing note reviewed.  Constitutional:      Appearance: She is obese.  HENT:     Head: Normocephalic and atraumatic.  Eyes:     Extraocular Movements: Extraocular movements intact.     Conjunctiva/sclera: Conjunctivae normal.     Pupils: Pupils are equal, round, and reactive to light.  Skin:    General: Skin is warm and dry.  Neurological:     Mental Status: She is alert and  oriented to person, place, and time.     Comments: Motor strength is 3 - at the left deltoid bicep tricep to minus finger flexors and extensors 3 - at the left hip flexor 4 - knee extensor and essentially is trace at the ankle  dorsiflexor plantar flexor Tone  Psychiatric:        Mood and Affect: Mood normal.        Behavior: Behavior normal.  Tone  MAS 2 in Left FF MAS 1 Left WF MAS 3 in Left thumb flexor      Assessment & Plan:  #1.  Left spastic hemiplegia secondary to right basal ganglia intracranial hemorrhage in 2019.  The patient has had only partial improvement with high-dose baclofen p.o. as well as physical and Occupational Therapy.  Patient has had good relief of upper extremity spasticity using Dysport we will repeat and add on thumb flexor Left FCR: 200 units   Left FDS: 400 units Left FDP: 300units Left FPL 100u  Repeat Dysport injection 1000 units in approximately 6 weeks.  Patient was not getting very good relief with Dysport in the lower extremity therefore would but with good relief following left tibial nerve neurolysis Expect Left tibial nerve phenol neurolysis to last 4-6 mo, will monitor prior to reschedule

## 2022-02-09 ENCOUNTER — Other Ambulatory Visit: Payer: No Typology Code available for payment source

## 2022-02-13 ENCOUNTER — Ambulatory Visit: Payer: No Typology Code available for payment source | Admitting: Orthopedic Surgery

## 2022-02-15 ENCOUNTER — Other Ambulatory Visit: Payer: Self-pay | Admitting: Physical Medicine & Rehabilitation

## 2022-02-15 ENCOUNTER — Other Ambulatory Visit: Payer: Self-pay | Admitting: Internal Medicine

## 2022-02-19 ENCOUNTER — Other Ambulatory Visit: Payer: Self-pay | Admitting: Internal Medicine

## 2022-02-25 ENCOUNTER — Ambulatory Visit (INDEPENDENT_AMBULATORY_CARE_PROVIDER_SITE_OTHER): Payer: No Typology Code available for payment source | Admitting: Family Medicine

## 2022-02-26 ENCOUNTER — Encounter: Payer: Self-pay | Admitting: Internal Medicine

## 2022-02-27 ENCOUNTER — Ambulatory Visit (INDEPENDENT_AMBULATORY_CARE_PROVIDER_SITE_OTHER): Payer: No Typology Code available for payment source

## 2022-02-27 ENCOUNTER — Other Ambulatory Visit: Payer: Self-pay

## 2022-02-27 ENCOUNTER — Ambulatory Visit (INDEPENDENT_AMBULATORY_CARE_PROVIDER_SITE_OTHER): Payer: No Typology Code available for payment source | Admitting: Orthopedic Surgery

## 2022-02-27 DIAGNOSIS — S42032A Displaced fracture of lateral end of left clavicle, initial encounter for closed fracture: Secondary | ICD-10-CM

## 2022-03-01 ENCOUNTER — Encounter: Payer: Self-pay | Admitting: Orthopedic Surgery

## 2022-03-01 NOTE — Progress Notes (Signed)
Mom for new mom ? ?Post-Op Visit Note ?  ?Patient: Abigail Campos           ?Date of Birth: Jan 07, 1971           ?MRN: [id number] ?Visit Date: 02/27/2022 ?PCP: Colon Branch, MD ? ? ?Assessment & Plan: ? ?Chief Complaint:  ?Chief Complaint  ?Patient presents with  ? Left Shoulder - Follow-up  ? ?Visit Diagnoses:  ?1. Displaced fracture of lateral end of left clavicle, initial encounter for closed fracture   ? ? ?Plan: Patient presents for follow-up of left clavicle fracture.  Date of injury 10/31/2021.  Overall she is doing better.  Not taking any medication for pain.  Left arm has been significantly affected by stroke.  On exam she has minimal tenderness and no crepitus with passive range of motion of that left arm.  Not too much swelling.  Radiographs look like the fracture is healed.  Plan is activity as tolerated and follow-up as needed ? ?Follow-Up Instructions: No follow-ups on file.  ? ?Orders:  ?Orders Placed This Encounter  ?Procedures  ? XR Clavicle Left  ? ?No orders of the defined types were placed in this encounter. ? ? ?Imaging: ?No results found. ? ?PMFS History: ?Patient Active Problem List  ? Diagnosis Date Noted  ? Nausea without vomiting 07/23/2021  ? Vitamin D deficiency 07/06/2021  ? History of CVA (cerebrovascular accident) 07/06/2021  ? Other constipation 03/18/2021  ? Hot flashes 03/17/2021  ? Prediabetes 02/11/2021  ? COVID-19 02/11/2021  ? Metabolic syndrome 22/63/3354  ? Neuropathic pain 09/19/2020  ? Adjustment disorder with mixed anxiety and depressed mood 09/19/2020  ? Facial droop 08/30/2020  ? Abnormality of gait 05/09/2020  ? Hemiplegia of nondominant side, late effect of cerebrovascular disease ([place]) 03/28/2020  ? Pleural effusion 03/01/2020  ? Sleep disturbance 02/06/2020  ? Spastic hemiplegia of left nondominant side due to nontraumatic intraparenchymal hemorrhage of brain ([place]) 12/26/2019  ? Trigger thumb of right hand 11/02/2019  ? OSA (obstructive sleep apnea) 07/26/2019  ?  Spastic hemiplegia of left nondominant side as late effect of nontraumatic intraparenchymal hemorrhage of brain ([place]) 04/06/2019  ? Spastic hemiparesis affecting dominant side ([place]) 01/04/2018  ? Hyperglycemia 12/29/2016  ? Depression 12/29/2016  ? Muscle spasticity   ? Class 1 obesity with serious comorbidity and body mass index (BMI) of 33.0 to 33.9 in adult 04/08/2016  ? Hyperlipidemia 04/08/2016  ? Basal ganglia hemorrhage ([place]) 04/08/2016  ? Dysphagia as late effect of cerebrovascular disease   ? Migraine with aura and without status migrainosus, not intractable   ? Gait disturbance, post-stroke   ? Hemiplegia, post-stroke [facility])   ? Aphasia, post-stroke   ? Dysarthria, post-stroke   ? ICH (intracerebral hemorrhage) (HCC) - R basal ganglia due to hypertensive emergency 04/01/2016  ? Upper airway cough syndrome 12/06/2015  ? PCP NOTES >>>>>>>>>>>>>>>>>>>>>>>>>>>.. 09/25/2015  ? Insomnia 12/28/2012  ? Annual physical exam 11/23/2011  ? Essential hypertension 10/19/2007  ? ?Past Medical History:  ?Diagnosis Date  ? Anemia   ? Anxiety and depression   ? Back pain   ? Essential hypertension 10/19/2007  ? 03-2010: metoprolol changed to bystolic (was not feeling well on it, no specific allergy or reaction)    ? Food allergy   ? Hemiplegia ([place])   ? Hyperlipemia   ? Hypertension   ? Joint pain   ? Migraine   ? Neuromuscular disorder ([place])   ? left sided hemiplegia - has brace for l leg  and walks with a cane  ? Obesity   ? Ovarian cancer ([place])   ? Prediabetes   ? Sleep apnea   ? not wearing c-pap yet  ? Sleep apnea   ? Stroke [facility]) 2017  ? Vitamin D deficiency   ?  ?Family History  ?Problem Relation Age of Onset  ? Diabetes Mother   ? Hypertension Mother   ? Glaucoma Mother   ? Colon cancer Mother   ?     mother age 43  ? Cancer Mother   ? Anxiety disorder Mother   ? Obesity Mother   ? Rectal cancer Father   ?     dx 35  ? Hypertension Father   ? Cancer Father   ? Sleep apnea Father   ? Alcoholism Father   ? Obesity Father    ? Stroke Paternal Grandfather   ? Stroke Maternal Grandfather   ? Diabetes Brother   ? Hypertension Brother   ? Heart disease Maternal Aunt   ?     x 2 did 2012 CAD-CHF  ? Diabetes Brother   ? Hypertension Brother   ? Lung cancer Maternal Aunt   ? Breast cancer Neg Hx   ? Esophageal cancer Neg Hx   ? Stomach cancer Neg Hx   ?  ?Past Surgical History:  ?Procedure Laterality Date  ? ABDOMINAL HYSTERECTOMY  11-14-07  ? no oophorectomy per surgical report  ? CESAREAN SECTION    ? [id number]  ? INGUINAL HERNIA REPAIR    ? 2004  ? ?Social History  ? ?Occupational History  ? Occupation: disable d/t stroke TEACHER  ?  Employer: [employer]  ?Tobacco Use  ? Smoking status: Never  ? Smokeless tobacco: Never  ?Vaping Use  ? Vaping Use: Never used  ?Substance and Sexual Activity  ? Alcohol use: No  ? Drug use: No  ? Sexual activity: Not Currently  ?  Birth control/protection: Surgical  ? ? ? ?

## 2022-03-02 ENCOUNTER — Other Ambulatory Visit (INDEPENDENT_AMBULATORY_CARE_PROVIDER_SITE_OTHER): Payer: No Typology Code available for payment source

## 2022-03-02 ENCOUNTER — Ambulatory Visit: Payer: No Typology Code available for payment source

## 2022-03-02 ENCOUNTER — Other Ambulatory Visit: Payer: Self-pay

## 2022-03-02 ENCOUNTER — Ambulatory Visit
Admission: RE | Admit: 2022-03-02 | Discharge: 2022-03-02 | Disposition: A | Discharge status: Home / Self Care | Payer: No Typology Code available for payment source | Source: Ambulatory Visit | Attending: Internal Medicine | Admitting: Internal Medicine

## 2022-03-02 DIAGNOSIS — R928 Other abnormal and inconclusive findings on diagnostic imaging of breast: Secondary | ICD-10-CM

## 2022-03-02 DIAGNOSIS — I10 Essential (primary) hypertension: Secondary | ICD-10-CM

## 2022-03-02 LAB — BASIC METABOLIC PANEL
BUN: 14 mg/dL (ref 6–23)
CO2: 31 mEq/L (ref 19–32)
Calcium: 9.3 mg/dL (ref 8.4–10.5)
Chloride: 102 mEq/L (ref 96–112)
Creatinine, Ser: 0.94 mg/dL (ref 0.40–1.20)
GFR: 71.84 mL/min (ref >60.00)
Glucose, Bld: 74 mg/dL (ref 70–99)
Potassium: 4 mEq/L (ref 3.5–5.1)
Sodium: 139 mEq/L (ref 135–145)

## 2022-03-02 IMAGING — MG MM DIGITAL DIAGNOSTIC UNILAT*L* W/ TOMO W/ CAD
4 series · 4 of 12 positions shown · non-contrast
Comparison: Previous exam(s).

CLINICAL DATA: Patient returns after screening for evaluation of
possible LEFT breast asymmetry.

EXAM:
DIGITAL DIAGNOSTIC UNILATERAL LEFT MAMMOGRAM WITH TOMOSYNTHESIS AND
CAD
TECHNIQUE: Left digital diagnostic mammography and breast tomosynthesis was
performed. The images were evaluated with computer-aided detection.

[L CC synth-2D]
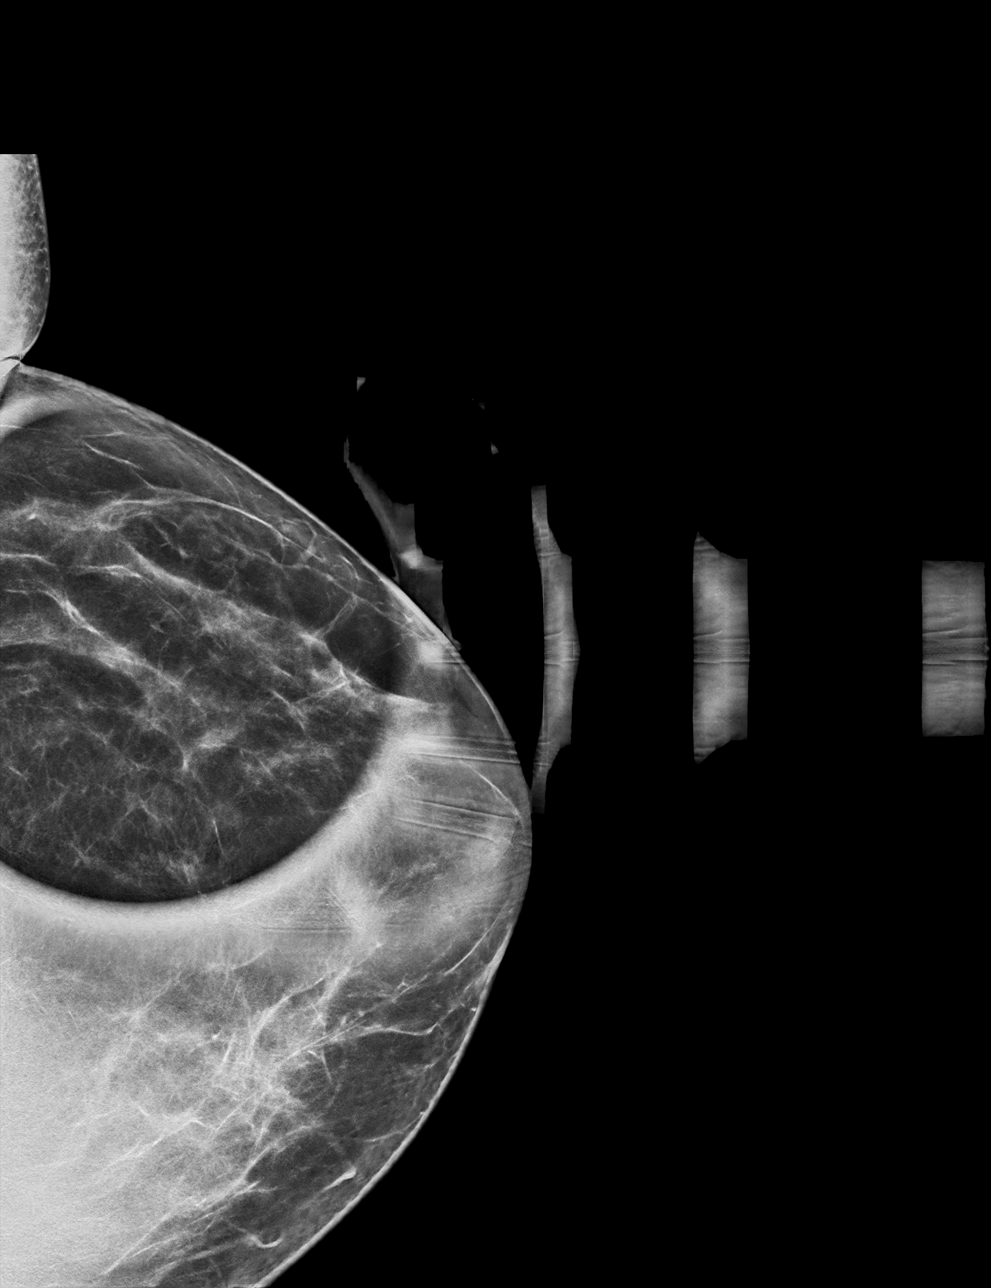

[L MLO synth-2D]
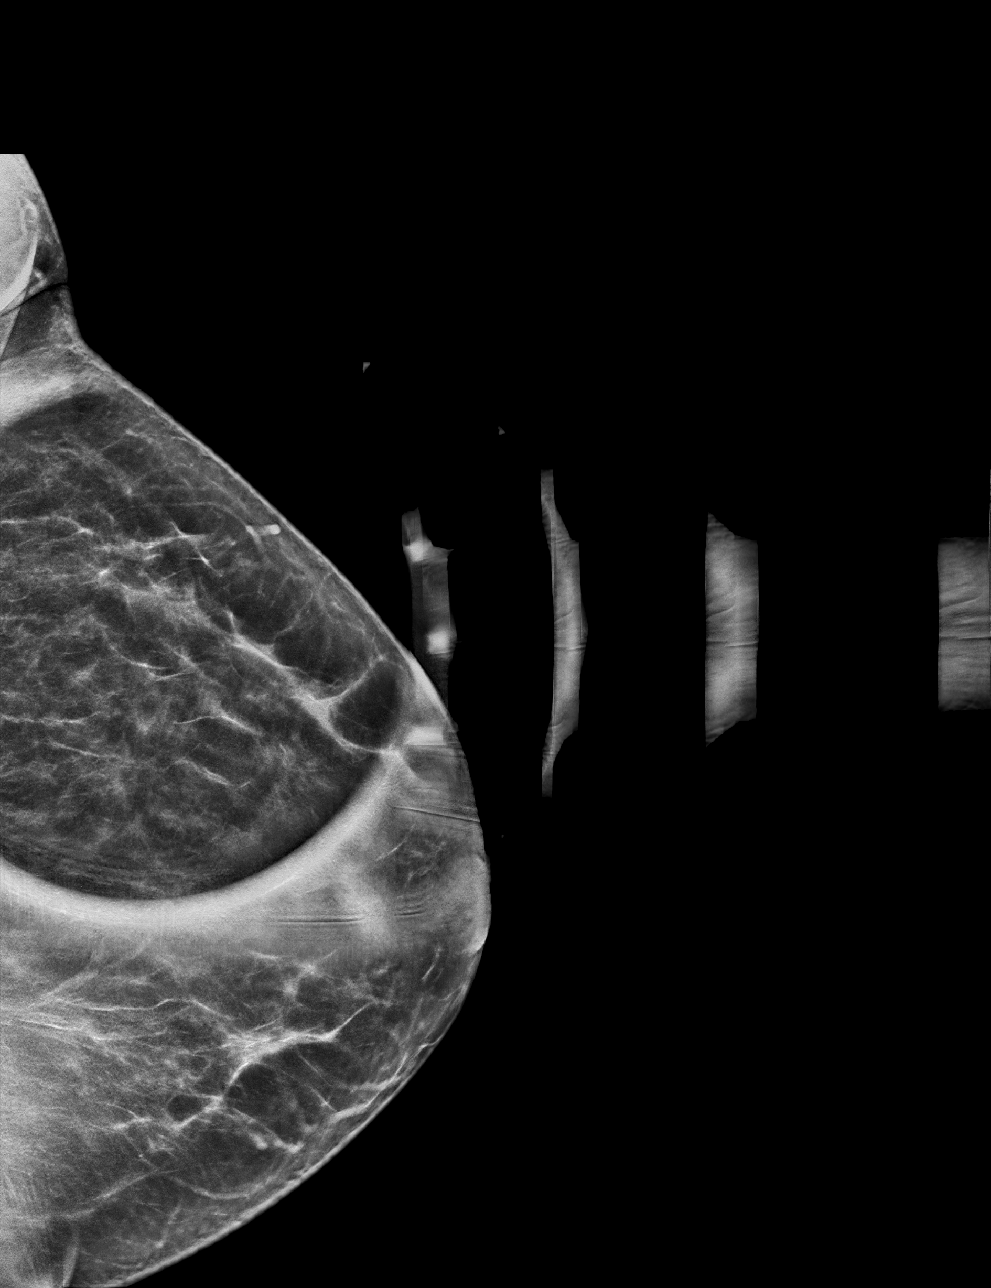

[L MLO tomo · tomo slice 33/65.0]
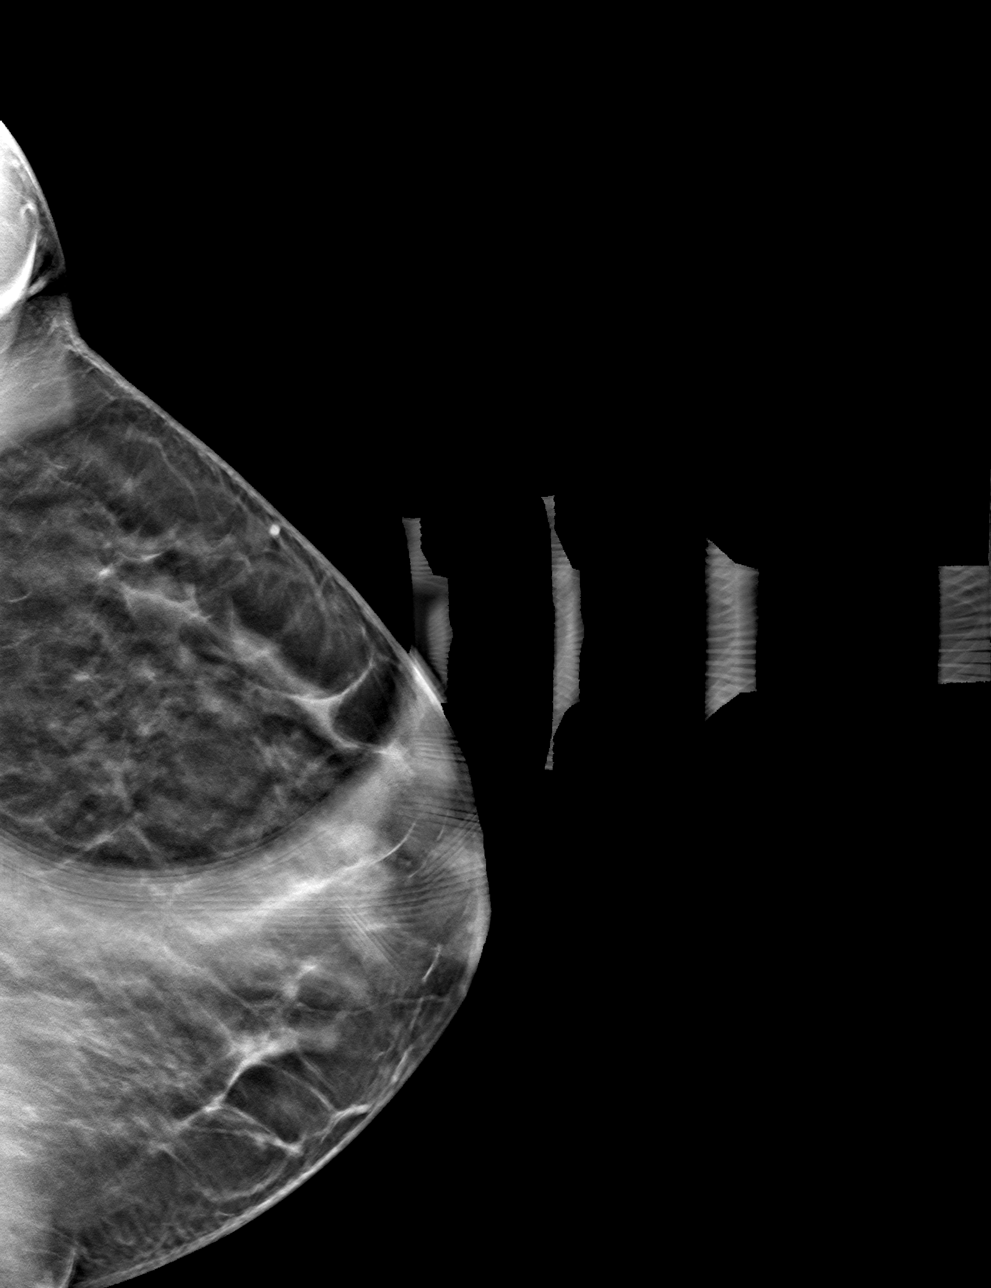

[L CC tomo · tomo slice 29/58.0]
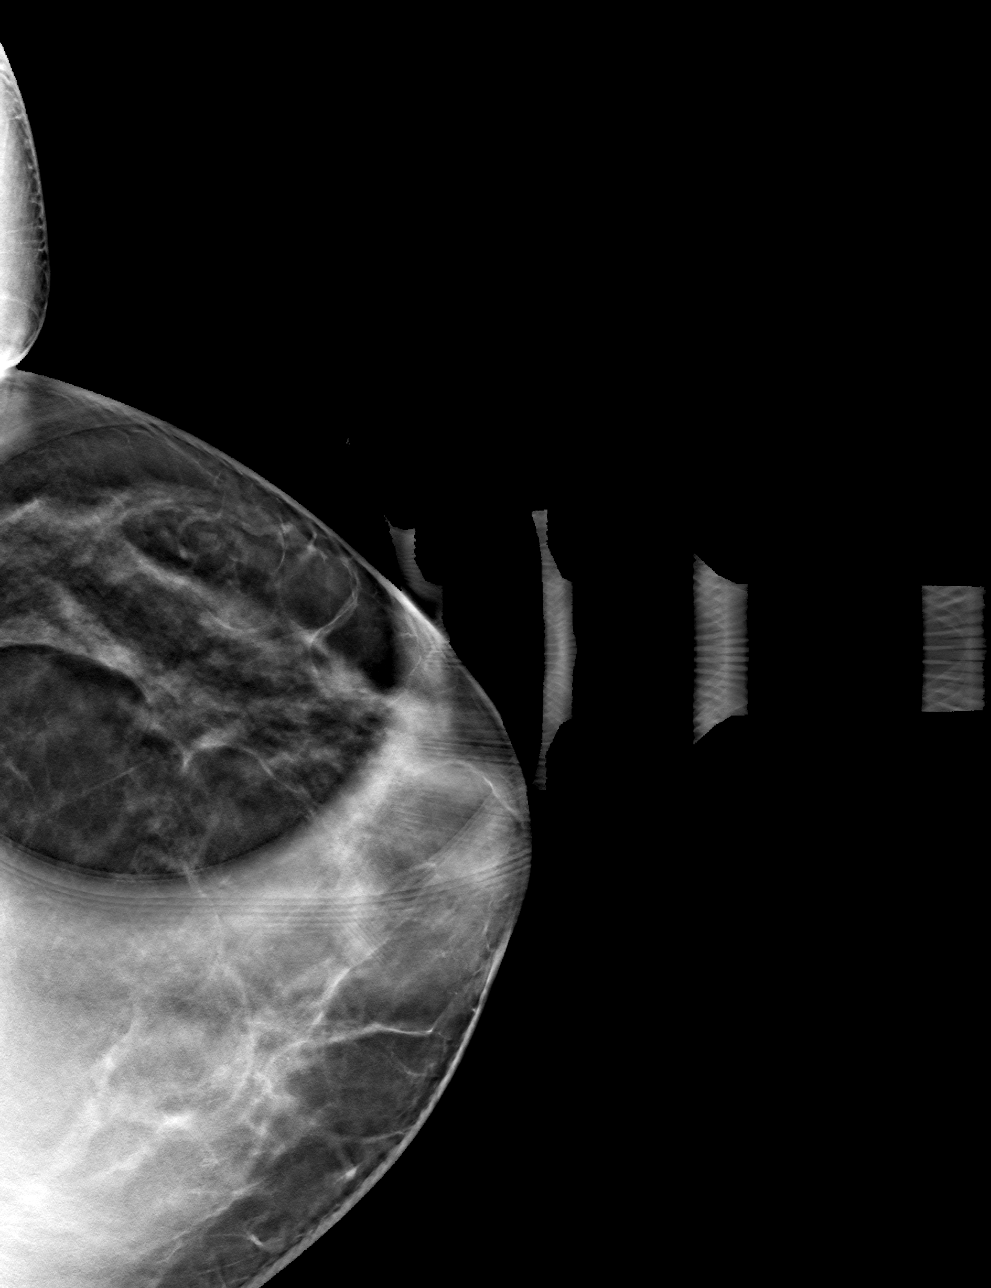

[4 of 12 positions shown; findings below may reference images not displayed]

ACR Breast Density Category b: There are scattered areas of
fibroglandular density.
FINDINGS: Additional 2-D and 3-D images are performed. These views show no
persistent asymmetry or mass in the LATERAL aspect of the LEFT
breast. No suspicious mass, distortion, or microcalcifications are
identified to suggest presence of malignancy.
IMPRESSION: No mammographic evidence for malignancy.

RECOMMENDATION:
Screening mammogram in one year.(Code:[93])

I have discussed the findings and recommendations with the patient.
If applicable, a reminder letter will be sent to the patient
regarding the next appointment.

BI-RADS CATEGORY  1: Negative.

## 2022-03-12 ENCOUNTER — Encounter (INDEPENDENT_AMBULATORY_CARE_PROVIDER_SITE_OTHER): Payer: Self-pay | Admitting: Family Medicine

## 2022-03-12 ENCOUNTER — Ambulatory Visit (INDEPENDENT_AMBULATORY_CARE_PROVIDER_SITE_OTHER): Payer: No Typology Code available for payment source | Admitting: Family Medicine

## 2022-03-12 VITALS — BP 135/89 | HR 80 | Temp 98.2°F | Ht 59.0 in | Wt 143.0 lb

## 2022-03-12 DIAGNOSIS — Z6829 Body mass index (BMI) 29.0-29.9, adult: Secondary | ICD-10-CM

## 2022-03-12 DIAGNOSIS — I10 Essential (primary) hypertension: Secondary | ICD-10-CM | POA: Diagnosis not present

## 2022-03-12 DIAGNOSIS — M21372 Foot drop, left foot: Secondary | ICD-10-CM

## 2022-03-12 DIAGNOSIS — E669 Obesity, unspecified: Secondary | ICD-10-CM

## 2022-03-12 DIAGNOSIS — K760 Fatty (change of) liver, not elsewhere classified: Secondary | ICD-10-CM

## 2022-03-12 DIAGNOSIS — R7301 Impaired fasting glucose: Secondary | ICD-10-CM | POA: Diagnosis not present

## 2022-03-16 ENCOUNTER — Encounter (INDEPENDENT_AMBULATORY_CARE_PROVIDER_SITE_OTHER): Payer: Self-pay | Admitting: Family Medicine

## 2022-03-16 ENCOUNTER — Other Ambulatory Visit: Payer: Self-pay | Admitting: Internal Medicine

## 2022-03-18 NOTE — Progress Notes (Signed)
Chief Complaint:   OBESITY Abigail Campos is here to discuss her progress with her obesity treatment plan along with follow-up of her obesity related diagnoses.   Today's visit was #: 37 Starting weight: 164 lbs Starting date: 12/26/2020 Today's weight: 143 lbs Today's date: 03/18/2022 Weight change since last visit: -1 lb Total lbs lost to date: 21 lbs Body mass index is 28.88 kg/m.  Total weight loss percentage to date: -12.80%  Current Meal Plan: practicing portion control and making smarter food choices, such as increasing vegetables and decreasing simple carbohydrates for 0% of the time.  Current Exercise Plan: None at this time. Current Anti-Obesity Medications: Mounjaro 5 mg subcutaneously weekly. Side effects: None.  Interim History: Abigail Campos reports that she is doing well. She has a new granddaughter, she has been helping her daughter in [place].  Assessment/Plan:   1. Essential hypertension Improving, but not optimized. Medications: Carvedilol 12.5 mg twice daily, Cozarr 50 mg daily.   Plan: Avoid buying foods that are: processed, frozen, or prepackaged to avoid excess salt. We will watch for signs of hypotension as she continues lifestyle modifications.  BP Readings from Last 3 Encounters:  03/12/22 135/89  02/03/22 (!) 135/97  01/26/22 (!) 146/102   Lab Results  Component Value Date   CREATININE 0.94 03/02/2022   2. Left foot drop, sequela of cardioembolic stroke Abigail Campos wears a left foot brace. She has continued to get stronger. We will continue to monitor symptoms as they relate to her weight loss journey.  3. Impaired fasting glucose, with polyphagia, improved significantly with Mounjaro Controlled. Goal is HgbA1c < 5.7.  Medication: Mounjaro 5 mg subcutaneously weekly.    Plan:  Continue Mounjaro. She will continue to focus on protein-rich, low simple carbohydrate foods. We reviewed the importance of  hydration, regular exercise for stress reduction, and restorative sleep.   Lab Results  Component Value Date   HGBA1C 5.6 11/24/2021   Lab Results  Component Value Date   INSULIN 11.5 06/25/2021   4. Hepatic steatosis Intensive lifestyle modifications are the first line treatment for this issue. We discussed several lifestyle modifications today and he will continue to work on diet, exercise and weight loss efforts.   5. Obesity BMI today is 29 Course: Abigail Campos is currently in the action stage of change. As such, her goal is to continue with weight loss efforts.   Nutrition goals: She has agreed to practicing portion control and making smarter food choices, such as increasing vegetables and decreasing simple carbohydrates.   Exercise goals: As is.  Behavioral modification strategies: increasing lean protein intake, decreasing simple carbohydrates, and increasing vegetables.  Abigail Campos has agreed to follow-up with our clinic in 6 weeks. She was informed of the importance of frequent follow-up visits to maximize her success with intensive lifestyle modifications for her multiple health conditions.   Objective:   Blood pressure 135/89, pulse 80, temperature 98.2 F (36.8 C), temperature source Oral, height '4\' 11"'$  (1.499 m), weight 143 lb (64.9 kg), SpO2 97 %. Body mass index is 28.88 kg/m.  General: Cooperative, alert, well developed, in no acute distress. HEENT: Conjunctivae and lids unremarkable. Cardiovascular: Regular rhythm.  Lungs:  Normal work of breathing. Neurologic: No focal deficits.   Lab Results  Component Value Date   CREATININE 0.94 03/02/2022   BUN 14 03/02/2022   NA 139 03/02/2022   K 4.0 03/02/2022   CL 102 03/02/2022   CO2 31 03/02/2022   Lab Results  Component Value Date   ALT 26 11/24/2021   AST 18 11/24/2021   ALKPHOS 85 11/24/2021   BILITOT 0.6 11/24/2021   Lab Results  Component Value Date   HGBA1C 5.6 11/24/2021   HGBA1C 5.8 05/20/2021   HGBA1C  6.0 11/19/2020   HGBA1C 6.1 (H) 08/31/2020   HGBA1C 5.7 02/14/2020   Lab Results  Component Value Date   INSULIN 11.5 06/25/2021   Lab Results  Component Value Date   TSH 0.737 06/11/2021   Lab Results  Component Value Date   CHOL 127 06/25/2021   HDL 51 06/25/2021   LDLCALC 64 06/25/2021   TRIG 53 06/25/2021   CHOLHDL 2.5 06/25/2021   Lab Results  Component Value Date   VD25OH 30.82 11/24/2021   VD25OH 33.3 06/25/2021   VD25OH 17.70 (L) 11/19/2020   Lab Results  Component Value Date   WBC 4.2 01/06/2022   HGB 12.4 01/06/2022   HCT 38.6 01/06/2022   MCV 94.6 01/06/2022   PLT 174 01/06/2022   Lab Results  Component Value Date   IRON 97 12/26/2020   TIBC 313 12/26/2020   FERRITIN 72 12/26/2020   Attestation Statements:   Reviewed by clinician on day of visit: allergies, medications, problem list, medical history, surgical history, family history, social history, and previous encounter notes.  Leodis Binet Friedenbach, CMA, am acting as Location manager for [employer], DO.  I have reviewed the above documentation for accuracy and completeness, and I agree with the above. -  Briscoe Deutscher, DO, MS, FAAFP, DABOM - Family and Bariatric Medicine.

## 2022-03-19 ENCOUNTER — Encounter
Payer: No Typology Code available for payment source | Attending: Physical Medicine & Rehabilitation | Admitting: Physical Medicine & Rehabilitation

## 2022-03-19 ENCOUNTER — Encounter: Payer: Self-pay | Admitting: Physical Medicine & Rehabilitation

## 2022-03-19 VITALS — BP 143/94 | HR 78 | Ht 59.0 in | Wt 139.0 lb

## 2022-03-19 DIAGNOSIS — G8114 Spastic hemiplegia affecting left nondominant side: Secondary | ICD-10-CM | POA: Diagnosis not present

## 2022-03-19 NOTE — Progress Notes (Signed)
Phenol neurolysis of the LEFT tibial nerve,   Indication: Severe spasticity in the plantar flexor muscles which is not responding to medical management and other conservative care and interfering with functional use.  Informed consent was obtained after describing the risks and benefits of the procedure with the patient this includes bleeding bruising and infection as well as medication side effects. The patient elected to proceed and has given written consent.In seated position Left FPL identified with estim .5ml 5% phenol injected with 22 g needle electrode  Patient placed in a prone position on the exam table. External DC stimulation was applied to the popliteal space using a nerve stimulator. Plantar flexion twitch was obtained. The popliteal region was prepped with Betadine and then entered with a 22-gauge 40 mm needle electrode under electrical stimulation guidance. Plantar flexion which was obtained and confirmed. Then 4 cc of 5% phenol were injected. The patient tolerated procedure well. Post procedure instructions and followup visit were given.  

## 2022-03-19 NOTE — Patient Instructions (Signed)
You received a Dysport injection today. You may experience muscle pains and aches. He may apply ice 20 minutes every 2 hours as needed for the next 24-48 hours. ?He also noticed bleeding or bruising in the areas that were injected. May apply Band-Aid. If this bruising is extensive, please notify our office. If there is evidence of increasing redness that occurs 2-3 days after injection. Please call our office. This could be a sign of infection. It is very rare, however. ?You may experience some muscle weakness in the muscles and injected. This would likely start in about one week.  ? ?Tibial nerve block with phenol today. ?This medication may start taking the fact today however full effect will be at about one week ?Duration of the effect is 3-6 months ?Side effects of medication may include right heel numbness or burning. ?Call if you have burning pain so we can recommend any medication for that.  ?

## 2022-03-19 NOTE — Progress Notes (Signed)
Dysport Injection for spasticity using needle EMG guidance ? ?Dilution: 200 Units/ml ?Indication: Severe spasticity which interferes with ADL,mobility and/or  hygiene and is unresponsive to medication management and other conservative care ?Informed consent was obtained after describing risks and benefits of the procedure with the patient. This includes bleeding, bruising, infection, excessive weakness, or medication side effects. A REMS form is on file and signed. ?Needle:  needle electrode ?Number of units per muscle ?Left FCR: 200 units ? ?Left FDS: 400 units ?Left FDP: 300units ?Left FPL   100u ? ?All injections were done after obtaining appropriate EMG activity and after negative drawback for blood. The patient tolerated the procedure well. Post procedure instructions were given. A followup appointment was made.  ? ?

## 2022-03-31 ENCOUNTER — Encounter: Payer: Self-pay | Admitting: Internal Medicine

## 2022-04-01 ENCOUNTER — Encounter: Payer: Self-pay | Admitting: Internal Medicine

## 2022-04-01 ENCOUNTER — Ambulatory Visit (INDEPENDENT_AMBULATORY_CARE_PROVIDER_SITE_OTHER): Payer: No Typology Code available for payment source | Admitting: Internal Medicine

## 2022-04-01 VITALS — BP 144/92 | HR 77 | Temp 98.0°F | Resp 16 | Ht 59.0 in | Wt 134.0 lb

## 2022-04-01 DIAGNOSIS — E785 Hyperlipidemia, unspecified: Secondary | ICD-10-CM

## 2022-04-01 DIAGNOSIS — I10 Essential (primary) hypertension: Secondary | ICD-10-CM

## 2022-04-01 MED ORDER — LOSARTAN POTASSIUM 100 MG PO TABS: 100.0000 mg | ORAL_TABLET | Freq: Every day | ORAL | 1 refills | Status: DC

## 2022-04-01 NOTE — Progress Notes (Signed)
? ?Subjective:  ? ? Patient ID: Abigail Campos, female    DOB: 08-23-1971, 51 y.o.   MRN: [id number] ? ?DOS:  04/01/2022 ?Type of visit - description: Acute ? ?Patient main concern is hypertension. ?BPs are not well controlled, ambulatory readings reviewed. ?Last night  was anxious because her BP was slightly elevated but otherwise no chronic anxiety. ? ?Reports she is doing well with diet, including low-salt. ?Good med compliance ?Denies chest pain or difficulty breathing. ?Left lower extremity edema at baseline. ?Yesterday had a mild headache but otherwise no headaches. ? ?BP Readings from Last 3 Encounters:  ?04/01/22 (!) 144/92  ?03/19/22 (!) 143/94  ?03/12/22 135/89  ? ? ? ? ?Review of Systems ?See above  ? ?Past Medical History:  ?Diagnosis Date  ? Anemia   ? Anxiety and depression   ? Back pain   ? Essential hypertension 10/19/2007  ? 03-2010: metoprolol changed to bystolic (was not feeling well on it, no specific allergy or reaction)    ? Food allergy   ? Hemiplegia ([place])   ? Hyperlipemia   ? Hypertension   ? Joint pain   ? Migraine   ? Neuromuscular disorder ([place])   ? left sided hemiplegia - has brace for l leg and walks with a cane  ? Obesity   ? Ovarian cancer ([place])   ? Prediabetes   ? Sleep apnea   ? not wearing c-pap yet  ? Sleep apnea   ? Stroke [facility]) 2017  ? Vitamin D deficiency   ? ? ?Past Surgical History:  ?Procedure Laterality Date  ? ABDOMINAL HYSTERECTOMY  11-14-07  ? no oophorectomy per surgical report  ? CESAREAN SECTION    ? [id number]  ? INGUINAL HERNIA REPAIR    ? 2004  ? ? ?Current Outpatient Medications  ?Medication Instructions  ? atorvastatin (LIPITOR) 20 MG tablet TAKE 1 TABLET BY MOUTH EVERY DAY  ? baclofen (LIORESAL) 30 mg, Oral, 3 times daily  ? buPROPion (WELLBUTRIN) 100 MG tablet TAKE 1 TABLET BY MOUTH TWICE A DAY  ? carvedilol (COREG) 12.5 MG tablet TAKE 1 TABLET (12.'5MG'$  TOTAL) BY MOUTH TWICE A DAY WITH MEALS  ? clopidogrel (PLAVIX) 75 MG tablet TAKE 1 TABLET BY MOUTH EVERY DAY  ?  escitalopram (LEXAPRO) 20 MG tablet TAKE 1 TABLET BY MOUTH EVERY DAY  ? gabapentin (NEURONTIN) 300 MG capsule TAKE 1 CAPSULE BY MOUTH THREE TIMES A DAY  ? losartan (COZAAR) 50 MG tablet TAKE 1 TABLET BY MOUTH EVERY DAY  ? tirzepatide [facility]) 5 mg, Subcutaneous, Weekly  ? traZODone (DESYREL) 100 mg, Oral, At bedtime PRN  ? ? ?   ?Objective:  ? Physical Exam ?BP (!) 144/92 (BP Location: Right Arm, Patient Position: Sitting, Cuff Size: Small)   Pulse 77   Temp 98 ?F (36.7 ?C) (Oral)   Resp 16   Ht '4\' 11"'$  (1.499 m)   Wt 134 lb (60.8 kg)   SpO2 98%   BMI 27.06 kg/m?  ?General:   ?Well developed, NAD, BMI noted. ?HEENT:  ?Normocephalic . Face symmetric, atraumatic ?Lungs:  ?CTA B ?Normal respiratory effort, no intercostal retractions, no accessory muscle use. ?Heart: RRR,  no murmur.  ?Skin: Not pale. Not jaundice ?Neurologic:  ?alert & oriented X3.  ?Speech normal, sequela of previous stroke at baseline ?Psych--  ?Cognition and judgment appear intact.  ?Cooperative with normal attention span and concentration.  ?Behavior appropriate. ?No anxious or depressed appearing.  ? ?   ?Assessment   ? ?Assessment   ?  Prediabetes: A1c 6.0 (April 2017)  ?HTN ?Hyperlipidemia ?Depression, insomnia ?GERD ?Stroke 03-2016: [place] (L)  spastic hemiplegia   ?ICH, right basilar ganglia due to HTN emergency, + encephalopathy, + dysphagia, aphasia, dysarthria ?Residual L spasticity.  ?CTA neck 03-2016 neg ?CTA head 10-17 (-) aneurysm ?Headaches , migraines dx after 2nd pregnancy, (-) CT head 2014 and 2015 ?Cough, persisting, saw Dr. Melvyn Novas [phone] ?OSA: Sleep study 08-2019, saw Dr. Annamaria Boots, intolerant to CPAP  ?COVID infection 01-2021 ? ? ?PLAN ?HTN: ?Patient sent records of ambulatory BPs in the last few days: ?144, 149, 148.  Diastolic BP is in the 66Y to low 100s. ?Recently, we switch lisinopril 2.5 to losartan 50 mg. ?Plan: ?Increase losartan to 100, continue carvedilol.  Monitor BPs, BMP in 2 weeks, see AVS. ?If not at goal increase  carvedilol dose. ?Dyslipidemia: On Lipitor, FLP in 2 weeks. ?RTC 4 months ? ?  ? ?This visit occurred during the SARS-CoV-2 public health emergency.  Safety protocols were in place, including screening questions prior to the visit, additional usage of staff PPE, and extensive cleaning of exam room while observing appropriate contact time as indicated for disinfecting solutions.  ? ?

## 2022-04-01 NOTE — Patient Instructions (Addendum)
Increase losartan to 100 mg in the morning ? ?Check the  blood pressure regularly ?BP GOAL is between 110/65 and  135/85. ?If it is consistently higher or lower, let me know ? ?Send me your blood pressure readings in 2 weeks, anticipating they will get better gradually ? ? ?GO TO THE FRONT DESK, PLEASE SCHEDULE YOUR APPOINTMENTS ?Come back for blood work in 2 weeks, fasting ? ?Come back for a checkup in 4 months ?

## 2022-04-02 NOTE — Assessment & Plan Note (Signed)
HTN: ?Patient sent records of ambulatory BPs in the last few days: ?144, 149, 148.  Diastolic BP is in the 14D to low 100s. ?Recently, we switch lisinopril 2.5 to losartan 50 mg. ?Plan: ?Increase losartan to 100, continue carvedilol.  Monitor BPs, BMP in 2 weeks, see AVS. ?If not at goal increase carvedilol dose. ?Dyslipidemia: On Lipitor, FLP in 2 weeks. ?RTC 4 months ?

## 2022-04-08 ENCOUNTER — Ambulatory Visit: Payer: No Typology Code available for payment source | Admitting: Internal Medicine

## 2022-04-15 ENCOUNTER — Ambulatory Visit: Payer: No Typology Code available for payment source

## 2022-04-15 ENCOUNTER — Other Ambulatory Visit (INDEPENDENT_AMBULATORY_CARE_PROVIDER_SITE_OTHER): Payer: No Typology Code available for payment source

## 2022-04-15 DIAGNOSIS — I10 Essential (primary) hypertension: Secondary | ICD-10-CM

## 2022-04-15 DIAGNOSIS — E785 Hyperlipidemia, unspecified: Secondary | ICD-10-CM | POA: Diagnosis not present

## 2022-04-15 LAB — LIPID PANEL
Cholesterol: 98 mg/dL (ref 0–200)
HDL: 40.1 mg/dL (ref >39.00)
LDL Cholesterol: 49 mg/dL (ref 0–99)
NonHDL: 58.12
Total CHOL/HDL Ratio: 2
Triglycerides: 45 mg/dL (ref 0.0–149.0)
VLDL: 9 mg/dL (ref 0.0–40.0)

## 2022-04-15 LAB — BASIC METABOLIC PANEL
BUN: 9 mg/dL (ref 6–23)
CO2: 31 mEq/L (ref 19–32)
Calcium: 8.7 mg/dL (ref 8.4–10.5)
Chloride: 103 mEq/L (ref 96–112)
Creatinine, Ser: 0.89 mg/dL (ref 0.40–1.20)
GFR: 76.65 mL/min (ref >60.00)
Glucose, Bld: 90 mg/dL (ref 70–99)
Potassium: 3.6 mEq/L (ref 3.5–5.1)
Sodium: 136 mEq/L (ref 135–145)

## 2022-04-17 ENCOUNTER — Ambulatory Visit: Payer: No Typology Code available for payment source | Admitting: Internal Medicine

## 2022-04-17 ENCOUNTER — Ambulatory Visit (INDEPENDENT_AMBULATORY_CARE_PROVIDER_SITE_OTHER): Payer: No Typology Code available for payment source

## 2022-04-17 VITALS — BP 131/89 | HR 79 | Temp 98.1°F | Resp 16 | Ht 59.0 in | Wt 140.0 lb

## 2022-04-17 DIAGNOSIS — Z0000 Encounter for general adult medical examination without abnormal findings: Secondary | ICD-10-CM

## 2022-04-17 NOTE — Progress Notes (Addendum)
? ?Subjective:  ? Abigail Campos is a 51 y.o. female who presents for an Initial Medicare Annual Wellness Visit. ? ? ?Review of Systems    ? ?Cardiac Risk Factors include: advanced age (>33mn, >>18women);hypertension;dyslipidemia ? ?   ?Objective:  ?  ?Today's Vitals  ? 04/17/22 0814  ?BP: 131/89  ?Pulse: 79  ?Resp: 16  ?Temp: 98.1 ?F (36.7 ?C)  ?SpO2: 99%  ?Weight: 140 lb (63.5 kg)  ?Height: '4\' 11"'$  (1.499 m)  ? ?Body mass index is 28.28 kg/m?. ? ? ?  04/17/2022  ?  8:19 AM 01/06/2022  ?  3:09 PM 10/29/2021  ? 12:44 PM 01/12/2021  ?  7:57 PM 01/06/2021  ?  2:41 PM 09/12/2020  ? 12:35 PM 08/30/2020  ? 10:07 PM  ?Advanced Directives  ?Does Patient Have a Medical Advance Directive? Yes No Yes Yes No Yes   ?Type of [place] DNR (pink MOST or yellow form);Living will  Living will;Healthcare Power of Attorney      ?Does patient want to make changes to medical advance directive?   No - Patient declined      ?Copy of [place]? No - copy requested        ?Would patient like information on creating a medical advance directive?     No - Patient declined  No - Patient declined  ? ? ?Current Medications (verified) ?Outpatient Encounter Medications as of 04/17/2022  ?Medication Sig  ? atorvastatin (LIPITOR) 20 MG tablet TAKE 1 TABLET BY MOUTH EVERY DAY  ? baclofen (LIORESAL) 10 MG tablet TAKE 3 TABLETS (30 MG TOTAL) BY MOUTH 3 (THREE) TIMES DAILY.  ? buPROPion (WELLBUTRIN) 100 MG tablet TAKE 1 TABLET BY MOUTH TWICE A DAY  ? carvedilol (COREG) 12.5 MG tablet TAKE 1 TABLET (12.'5MG'$  TOTAL) BY MOUTH TWICE A DAY WITH MEALS  ? clopidogrel (PLAVIX) 75 MG tablet TAKE 1 TABLET BY MOUTH EVERY DAY  ? escitalopram (LEXAPRO) 20 MG tablet TAKE 1 TABLET BY MOUTH EVERY DAY  ? gabapentin (NEURONTIN) 300 MG capsule TAKE 1 CAPSULE BY MOUTH THREE TIMES A DAY  ? losartan (COZAAR) 100 MG tablet Take 1 tablet (100 mg total) by mouth daily.  ? tirzepatide (MOUNJARO) 5  MG/0.5ML Pen Inject 5 mg into the skin once a week.  ? traZODone (DESYREL) 100 MG tablet Take 1 tablet (100 mg total) by mouth at bedtime as needed for sleep.  ? ?No facility-administered encounter medications on file as of 04/17/2022.  ? ? ?Allergies (verified) ?Bee venom, Peanut-containing drug products, Aspirin, Isovue [iopamidol], Latex, Ambien [zolpidem tartrate], and Hydrocodone  ? ?History: ?Past Medical History:  ?Diagnosis Date  ? Anemia   ? Anxiety and depression   ? Back pain   ? Essential hypertension 10/19/2007  ? 03-2010: metoprolol changed to bystolic (was not feeling well on it, no specific allergy or reaction)    ? Food allergy   ? [place]iplegia ([place]   ? Hyperlipemia   ? Hypertension   ? Joint pain   ? Migraine   ? Neuromuscular disorder ([place]   ? left sided hemiplegia - has brace for l leg and walks with a cane  ? Obesity   ? Ovarian cancer ([place]   ? Prediabetes   ? Sleep apnea   ? not wearing c-pap yet  ? Sleep apnea   ? Stroke [facility]  ? Vitamin D deficiency   ? ?Past Surgical History:  ?Procedure  Laterality Date  ? ABDOMINAL HYSTERECTOMY  11-14-07  ? no oophorectomy per surgical report  ? CESAREAN SECTION    ? [id number]  ? INGUINAL HERNIA REPAIR    ? 2004  ? ?Family History  ?Problem Relation Age of Onset  ? Diabetes Mother   ? Hypertension Mother   ? Glaucoma Mother   ? Colon cancer Mother   ?     mother age 70  ? Cancer Mother   ? Anxiety disorder Mother   ? Obesity Mother   ? Rectal cancer Father   ?     dx 37  ? Hypertension Father   ? Cancer Father   ? Sleep apnea Father   ? Alcoholism Father   ? Obesity Father   ? Stroke Paternal Grandfather   ? Stroke Maternal Grandfather   ? Diabetes Brother   ? Hypertension Brother   ? Heart disease Maternal Aunt   ?     x 2 did 2012 CAD-CHF  ? Diabetes Brother   ? Hypertension Brother   ? Lung cancer Maternal Aunt   ? Breast cancer Neg Hx   ? Esophageal cancer Neg Hx   ? Stomach cancer Neg Hx   ? ?Social History  ? ?Socioeconomic History  ? Marital status:  Married  ?  Spouse name: [name]  ? Number of children: 2  ? Years of education: Not on file  ? Highest education level: Not on file  ?Occupational History  ? Occupation: disable d/t stroke TEACHER  ?  Employer: [employer]  ?Tobacco Use  ? Smoking status: Never  ? Smokeless tobacco: Never  ?Vaping Use  ? Vaping Use: Never used  ?Substance and Sexual Activity  ? Alcohol use: No  ? Drug use: No  ? Sexual activity: Not Currently  ?  Birth control/protection: Surgical  ?Other Topics Concern  ? Not on file  ?Social History Narrative  ? Lives with husband and son (2007)  ? Left Hand prior to stroke  ? Drinks 1-2 cups daily  ? Daughter (1998), married   ? ?Social Determinants of Health  ? ?Financial Resource Strain: Not on file  ?Food Insecurity: Not on file  ?Transportation Needs: Not on file  ?Physical Activity: Not on file  ?Stress: Not on file  ?Social Connections: Not on file  ? ? ?Tobacco Counseling ?Counseling given: Not Answered ? ? ?Clinical Intake: ? ?Pre-visit preparation completed: Yes ? ?Pain : No/denies pain ? ?  ? ?Nutritional Risks: None ?Diabetes: No ? ?How often do you need to have someone help you when you read instructions, pamphlets, or other written materials from your doctor or pharmacy?: 1 - Never ? ?Diabetic?No ? ?Interpreter Needed?: No ? ?Information entered by :: Dorance Spink ? ? ?Activities of Daily Living ? ?  04/17/2022  ?  8:22 AM  ?In your present state of health, do you have any difficulty performing the following activities:  ?Hearing? 0  ?Vision? 1  ?Difficulty concentrating or making decisions? 0  ?Walking or climbing stairs? 1  ?Dressing or bathing? 0  ?Doing errands, shopping? 0  ?Preparing Food and eating ? N  ?Using the Toilet? N  ?In the past six months, have you accidently leaked urine? Y  ?Do you have problems with loss of bowel control? N  ?Managing your Medications? N  ?Managing your Finances? N  ?Housekeeping or managing your Housekeeping? N  ? ? ?Patient Care Team: ?Colon Branch, MD as PCP - General ?Delice Lesch  Maurene Capes, MD as Consulting Physician (Physical Medicine and Rehabilitation) ?Rosalin Hawking, MD as Consulting Physician (Neurology) ? ?Indicate any recent Medical Services you may have received from other than [facility] providers in the past year (date may be approximate). ? ?   ?Assessment:  ? This is a routine wellness examination for L[place]ia. ? ?Hearing/Vision screen ?No results found. ? ?Dietary issues and exercise activities discussed: ?Current Exercise Habits: Home exercise routine, Time (Minutes): 30, Frequency (Times/Week): 7, Weekly Exercise (Minutes/Week): 210, Intensity: Mild, Exercise limited by: None identified ? ? Goals Addressed   ?None ?  ? ?Depression Screen ? ?  04/17/2022  ?  8:20 AM 04/01/2022  ?  1:16 PM 03/19/2022  ?  1:13 PM 12/19/2021  ? 12:40 PM 09/30/2021  ? 10:41 AM 08/29/2021  ?  1:19 PM 07/01/2021  ?  1:12 PM  ?PHQ 2/9 Scores  ?PHQ - 2 Score 0 0 0 '2 2 2 2  '$ ?PHQ- 9 Score  8   10    ?  ?Fall Risk ? ?  04/17/2022  ?  8:20 AM 03/19/2022  ?  1:13 PM 12/19/2021  ? 12:40 PM 11/24/2021  ? 10:09 AM 10/17/2021  ?  1:54 PM  ?Fall Risk   ?Falls in the past year? 0 0 '1 1 1  '$ ?Number falls in past yr: 0 0 1 0 0  ?Comment   last one christmas , and one was thanksgiving    ?Injury with Fall? 0  1 1 0  ?Comment   fx clavical    ?Risk for fall due to : History of fall(s)  Impaired balance/gait;History of fall(s)    ?Follow up Falls evaluation completed   Falls evaluation completed   ? ? ?FALL RISK PREVENTION PERTAINING TO THE HOME: ? ?Any stairs in or around the home? No  ?If so, are there any without handrails?  N/A ?Home free of loose throw rugs in walkways, pet beds, electrical cords, etc? Yes  ?Adequate lighting in your home to reduce risk of falls? Yes  ? ?ASSISTIVE DEVICES UTILIZED TO PREVENT FALLS: ? ?Life alert? No  ?Use of a cane, walker or w/c? Yes  ?Grab bars in the bathroom? Yes  ?Shower chair or bench in shower? Yes  ?Elevated toilet seat or a handicapped toilet? No  ? ?TIMED UP AND  GO: ? ?Was the test performed? Yes .  ?Length of time to ambulate 10 feet: 15 sec.  ? ?Gait steady and fast with assistive device ? ?Cognitive Function: ? ?  06/11/2021  ? 11:10 AM  ?MMSE - Mini Mental State E

## 2022-04-17 NOTE — Patient Instructions (Signed)
Abigail Campos , ?Thank you for taking time to come for your Medicare Wellness Visit. I appreciate your ongoing commitment to your health goals. Please review the following plan we discussed and let me know if I can assist you in the future.  ? ?Screening recommendations/referrals: ?Colonoscopy: 10/06/19 due 10/05/24 ?Mammogram: 01/23/22 due 01/23/23 ?Bone Density: N/A ?Recommended yearly ophthalmology/optometry visit for glaucoma screening and checkup ?Recommended yearly dental visit for hygiene and checkup ? ?Vaccinations: ?Influenza vaccine: up to date ?Pneumococcal vaccine: up to date ?Tdap vaccine: up to date ?Shingles vaccine: N/A  ?Covid-19: completed ? ?Advanced directives: yes, not on file ? ?Conditions/risks identified: see problem list ? ?Next appointment: Follow up in one year for your annual wellness visit.  ? ?Preventive Care 40-64 Years, Female ?Preventive care refers to lifestyle choices and visits with your health care provider that can promote health and wellness. ?What does preventive care include? ?A yearly physical exam. This is also called an annual well check. ?Dental exams once or twice a year. ?Routine eye exams. Ask your health care provider how often you should have your eyes checked. ?Personal lifestyle choices, including: ?Daily care of your teeth and gums. ?Regular physical activity. ?Eating a healthy diet. ?Avoiding tobacco and drug use. ?Limiting alcohol use. ?Practicing safe sex. ?Taking low-dose aspirin daily starting at age 70. ?Taking vitamin and mineral supplements as recommended by your health care provider. ?What happens during an annual well check? ?The services and screenings done by your health care provider during your annual well check will depend on your age, overall health, lifestyle risk factors, and family history of disease. ?Counseling  ?Your health care provider may ask you questions about your: ?Alcohol use. ?Tobacco use. ?Drug use. ?Emotional well-being. ?Home and  relationship well-being. ?Sexual activity. ?Eating habits. ?Work and work Statistician. ?Method of birth control. ?Menstrual cycle. ?Pregnancy history. ?Screening  ?You may have the following tests or measurements: ?Height, weight, and BMI. ?Blood pressure. ?Lipid and cholesterol levels. These may be checked every 5 years, or more frequently if you are over 62 years old. ?Skin check. ?Lung cancer screening. You may have this screening every year starting at age 24 if you have a 30-pack-year history of smoking and currently smoke or have quit within the past 15 years. ?Fecal occult blood test (FOBT) of the stool. You may have this test every year starting at age 83. ?Flexible sigmoidoscopy or colonoscopy. You may have a sigmoidoscopy every 5 years or a colonoscopy every 10 years starting at age 73. ?Hepatitis C blood test. ?Hepatitis B blood test. ?Sexually transmitted disease (STD) testing. ?Diabetes screening. This is done by checking your blood sugar (glucose) after you have not eaten for a while (fasting). You may have this done every 1-3 years. ?Mammogram. This may be done every 1-2 years. Talk to your health care provider about when you should start having regular mammograms. This may depend on whether you have a family history of breast cancer. ?BRCA-related cancer screening. This may be done if you have a family history of breast, ovarian, tubal, or peritoneal cancers. ?Pelvic exam and Pap test. This may be done every 3 years starting at age 45. Starting at age 65, this may be done every 5 years if you have a Pap test in combination with an HPV test. ?Bone density scan. This is done to screen for osteoporosis. You may have this scan if you are at high risk for osteoporosis. ?Discuss your test results, treatment options, and if necessary, the need for  more tests with your health care provider. ?Vaccines  ?Your health care provider may recommend certain vaccines, such as: ?Influenza vaccine. This is recommended  every year. ?Tetanus, diphtheria, and acellular pertussis (Tdap, Td) vaccine. You may need a Td booster every 10 years. ?Zoster vaccine. You may need this after age 11. ?Pneumococcal 13-valent conjugate (PCV13) vaccine. You may need this if you have certain conditions and were not previously vaccinated. ?Pneumococcal polysaccharide (PPSV23) vaccine. You may need one or two doses if you smoke cigarettes or if you have certain conditions. ?Talk to your health care provider about which screenings and vaccines you need and how often you need them. ?This information is not intended to replace advice given to you by your health care provider. Make sure you discuss any questions you have with your health care provider. ?Document Released: 12/27/2015 Document Revised: 08/19/2016 Document Reviewed: 10/01/2015 ?Elsevier Interactive Patient Education ? 2017 [employer] ? ? ? ?Fall Prevention in the Home ?Falls can cause injuries. They can happen to people of all ages. There are many things you can do to make your home safe and to help prevent falls. ?What can I do on the outside of my home? ?Regularly fix the edges of walkways and driveways and fix any cracks. ?Remove anything that might make you trip as you walk through a door, such as a raised step or threshold. ?Trim any bushes or trees on the path to your home. ?Use bright outdoor lighting. ?Clear any walking paths of anything that might make someone trip, such as rocks or tools. ?Regularly check to see if handrails are loose or broken. Make sure that both sides of any steps have handrails. ?Any raised decks and porches should have guardrails on the edges. ?Have any leaves, snow, or ice cleared regularly. ?Use sand or salt on walking paths during winter. ?Clean up any spills in your garage right away. This includes oil or grease spills. ?What can I do in the bathroom? ?Use night lights. ?Install grab bars by the toilet and in the tub and shower. Do not use towel bars as  grab bars. ?Use non-skid mats or decals in the tub or shower. ?If you need to sit down in the shower, use a plastic, non-slip stool. ?Keep the floor dry. Clean up any water that spills on the floor as soon as it happens. ?Remove soap buildup in the tub or shower regularly. ?Attach bath mats securely with double-sided non-slip rug tape. ?Do not have throw rugs and other things on the floor that can make you trip. ?What can I do in the bedroom? ?Use night lights. ?Make sure that you have a light by your bed that is easy to reach. ?Do not use any sheets or blankets that are too big for your bed. They should not hang down onto the floor. ?Have a firm chair that has side arms. You can use this for support while you get dressed. ?Do not have throw rugs and other things on the floor that can make you trip. ?What can I do in the kitchen? ?Clean up any spills right away. ?Avoid walking on wet floors. ?Keep items that you use a lot in easy-to-reach places. ?If you need to reach something above you, use a strong step stool that has a grab bar. ?Keep electrical cords out of the way. ?Do not use floor polish or wax that makes floors slippery. If you must use wax, use non-skid floor wax. ?Do not have throw rugs and other things  on the floor that can make you trip. ?What can I do with my stairs? ?Do not leave any items on the stairs. ?Make sure that there are handrails on both sides of the stairs and use them. Fix handrails that are broken or loose. Make sure that handrails are as long as the stairways. ?Check any carpeting to make sure that it is firmly attached to the stairs. Fix any carpet that is loose or worn. ?Avoid having throw rugs at the top or bottom of the stairs. If you do have throw rugs, attach them to the floor with carpet tape. ?Make sure that you have a light switch at the top of the stairs and the bottom of the stairs. If you do not have them, ask someone to add them for you. ?What else can I do to help prevent  falls? ?Wear shoes that: ?Do not have high heels. ?Have rubber bottoms. ?Are comfortable and fit you well. ?Are closed at the toe. Do not wear sandals. ?If you use a stepladder: ?Make sure that it is fu

## 2022-04-21 DIAGNOSIS — K760 Fatty (change of) liver, not elsewhere classified: Secondary | ICD-10-CM | POA: Diagnosis not present

## 2022-04-21 DIAGNOSIS — R7301 Impaired fasting glucose: Secondary | ICD-10-CM | POA: Diagnosis not present

## 2022-04-21 DIAGNOSIS — K5900 Constipation, unspecified: Secondary | ICD-10-CM | POA: Diagnosis not present

## 2022-04-21 DIAGNOSIS — Z8639 Personal history of other endocrine, nutritional and metabolic disease: Secondary | ICD-10-CM | POA: Diagnosis not present

## 2022-04-21 DIAGNOSIS — Z6828 Body mass index (BMI) 28.0-28.9, adult: Secondary | ICD-10-CM | POA: Diagnosis not present

## 2022-04-21 DIAGNOSIS — I10 Essential (primary) hypertension: Secondary | ICD-10-CM | POA: Diagnosis not present

## 2022-04-21 DIAGNOSIS — M21372 Foot drop, left foot: Secondary | ICD-10-CM | POA: Diagnosis not present

## 2022-04-22 ENCOUNTER — Encounter (INDEPENDENT_AMBULATORY_CARE_PROVIDER_SITE_OTHER): Payer: Self-pay

## 2022-04-27 ENCOUNTER — Ambulatory Visit: Payer: No Typology Code available for payment source | Admitting: Internal Medicine

## 2022-04-29 ENCOUNTER — Other Ambulatory Visit: Payer: Self-pay | Admitting: Internal Medicine

## 2022-04-29 ENCOUNTER — Other Ambulatory Visit: Payer: Self-pay | Admitting: Orthopedic Surgery

## 2022-04-29 ENCOUNTER — Other Ambulatory Visit: Payer: Self-pay | Admitting: Physical Medicine & Rehabilitation

## 2022-05-14 ENCOUNTER — Encounter: Payer: Self-pay | Admitting: Internal Medicine

## 2022-05-14 DIAGNOSIS — I610 Nontraumatic intracerebral hemorrhage in hemisphere, subcortical: Secondary | ICD-10-CM

## 2022-05-19 DIAGNOSIS — Z8673 Personal history of transient ischemic attack (TIA), and cerebral infarction without residual deficits: Secondary | ICD-10-CM | POA: Diagnosis not present

## 2022-05-19 DIAGNOSIS — M21372 Foot drop, left foot: Secondary | ICD-10-CM | POA: Diagnosis not present

## 2022-05-19 DIAGNOSIS — R300 Dysuria: Secondary | ICD-10-CM | POA: Diagnosis not present

## 2022-05-19 DIAGNOSIS — Z6827 Body mass index (BMI) 27.0-27.9, adult: Secondary | ICD-10-CM | POA: Diagnosis not present

## 2022-05-19 DIAGNOSIS — Z8639 Personal history of other endocrine, nutritional and metabolic disease: Secondary | ICD-10-CM | POA: Diagnosis not present

## 2022-05-19 DIAGNOSIS — N3941 Urge incontinence: Secondary | ICD-10-CM | POA: Diagnosis not present

## 2022-05-19 DIAGNOSIS — R632 Polyphagia: Secondary | ICD-10-CM | POA: Diagnosis not present

## 2022-05-30 ENCOUNTER — Encounter: Payer: Self-pay | Admitting: Internal Medicine

## 2022-06-02 MED ORDER — CARVEDILOL 25 MG PO TABS: 25.0000 mg | ORAL_TABLET | Freq: Two times a day (BID) | ORAL | 1 refills | Status: DC

## 2022-06-25 ENCOUNTER — Other Ambulatory Visit: Payer: Self-pay | Admitting: Internal Medicine

## 2022-06-25 MED ORDER — LOSARTAN POTASSIUM 100 MG PO TABS: 100.0000 mg | ORAL_TABLET | Freq: Every day | ORAL | 1 refills | Status: DC

## 2022-06-26 ENCOUNTER — Encounter
Payer: No Typology Code available for payment source | Attending: Physical Medicine & Rehabilitation | Admitting: Physical Medicine & Rehabilitation

## 2022-06-26 ENCOUNTER — Encounter: Payer: Self-pay | Admitting: Physical Medicine & Rehabilitation

## 2022-06-26 VITALS — BP 124/87 | HR 81 | Temp 98.6°F | Ht 59.0 in | Wt 138.0 lb

## 2022-06-26 DIAGNOSIS — G8114 Spastic hemiplegia affecting left nondominant side: Secondary | ICD-10-CM | POA: Insufficient documentation

## 2022-06-26 NOTE — Progress Notes (Signed)
Dysport Injection for spasticity using needle EMG guidance  Dilution: 200 Units/ml Indication: Severe spasticity which interferes with ADL,mobility and/or  hygiene and is unresponsive to medication management and other conservative care Informed consent was obtained after describing risks and benefits of the procedure with the patient. This includes bleeding, bruising, infection, excessive weakness, or medication side effects. A REMS form is on file and signed. Needle:  needle electrode Number of units per muscle Left FCR: 200 units  Left FDS: 400 units Left FDP: 300units Left FPL   100u  All injections were done after obtaining appropriate EMG activity and after negative drawback for blood. The patient tolerated the procedure well. Post procedure instructions were given. A followup appointment was made.    Phenol neurolysis of the LEFTtibial nerve  Indication: Severe spasticity in the plantar flexor muscles which is not responding to medical management and other conservative care and interfering with functional use.  Informed consent was obtained after describing the risks and benefits of the procedure with the patient this includes bleeding bruising and infection as well as medication side effects. The patient elected to proceed and has given written consent. Patient placed in a prone position on the exam table. External DC stimulation was applied to the popliteal space using a nerve stimulator. Plantar flexion twitch was obtained. The popliteal region was prepped with Betadine and then entered with a 22-gauge 40 mm needle electrode under electrical stimulation guidance. Plantar flexion which was obtained and confirmed. Then 4 cc of 5% phenol were injected. The patient tolerated procedure well. Post procedure instructions and followup visit were given.

## 2022-06-26 NOTE — Patient Instructions (Signed)
You received a Dysport injection today. You may experience muscle pains and aches. He may apply ice 20 minutes every 2 hours as needed for the next 24-48 hours. ?He also noticed bleeding or bruising in the areas that were injected. May apply Band-Aid. If this bruising is extensive, please notify our office. If there is evidence of increasing redness that occurs 2-3 days after injection. Please call our office. This could be a sign of infection. It is very rare, however. ?You may experience some muscle weakness in the muscles and injected. This would likely start in about one week.  ? ?Tibial nerve block with phenol today. ?This medication may start taking the fact today however full effect will be at about one week ?Duration of the effect is 3-6 months ?Side effects of medication may include right heel numbness or burning. ?Call if you have burning pain so we can recommend any medication for that.  ?

## 2022-07-02 DIAGNOSIS — R7301 Impaired fasting glucose: Secondary | ICD-10-CM | POA: Diagnosis not present

## 2022-07-02 DIAGNOSIS — E663 Overweight: Secondary | ICD-10-CM | POA: Diagnosis not present

## 2022-07-02 DIAGNOSIS — Z8673 Personal history of transient ischemic attack (TIA), and cerebral infarction without residual deficits: Secondary | ICD-10-CM | POA: Diagnosis not present

## 2022-07-02 DIAGNOSIS — I10 Essential (primary) hypertension: Secondary | ICD-10-CM | POA: Diagnosis not present

## 2022-07-02 DIAGNOSIS — K760 Fatty (change of) liver, not elsewhere classified: Secondary | ICD-10-CM | POA: Diagnosis not present

## 2022-07-22 ENCOUNTER — Encounter (INDEPENDENT_AMBULATORY_CARE_PROVIDER_SITE_OTHER): Payer: Self-pay

## 2022-07-23 ENCOUNTER — Ambulatory Visit (INDEPENDENT_AMBULATORY_CARE_PROVIDER_SITE_OTHER): Payer: No Typology Code available for payment source | Admitting: Neurology

## 2022-07-23 ENCOUNTER — Encounter: Payer: Self-pay | Admitting: Neurology

## 2022-07-23 VITALS — BP 127/92 | HR 74 | Ht <= 58 in | Wt 139.0 lb

## 2022-07-23 DIAGNOSIS — F322 Major depressive disorder, single episode, severe without psychotic features: Secondary | ICD-10-CM | POA: Insufficient documentation

## 2022-07-23 DIAGNOSIS — G8110 Spastic hemiplegia affecting unspecified side: Secondary | ICD-10-CM

## 2022-07-23 DIAGNOSIS — G3184 Mild cognitive impairment, so stated: Secondary | ICD-10-CM | POA: Diagnosis not present

## 2022-07-23 MED ORDER — PREVAGEN 10 MG PO CAPS: 1.0000 | ORAL_CAPSULE | ORAL | 1 refills | Status: AC

## 2022-07-23 NOTE — Progress Notes (Signed)
[facility] [address] [place]. [place] [address] [phone]       OFFICE FOLLOW UP VISIT NOTE  [name] Date of Birth:  June 03, 1971 Medical Record Number:  [id number]   Referring MD:  Kathlene November  Reason for Referral: Memory loss  CXK:[id number] visit 06/11/2021: [name] is a pleasant 51 year old African-American lady seen today for consultation visit for memory loss.  History is obtained from the patient and review of electronic medical records and I have personally reviewed available imaging films in PACS.  She has past medical history of hypertension, hyperlipidemia, right basal ganglia intracerebral hemorrhage in April 2017 with residual spastic left hemiplegia, depression, sleep apnea, anemia.  Patient states that she is noticing increasing memory loss and short-term memory difficulties for the last 3 years.  She is to initially managed to write things down and get by but of late this has become difficult.  She is often forgotten the way to drive to her mother's house and has recently given up driving as a result.  She is fairly independent in all activities of daily living and can feed dress and bathe herself.  She does have history of anxiety depression and has noted increased mood swings since her menopause few years.  She is on Wellbutrin and recently dose was increased to 300 mg daily and she also takes Lexapro.  She denies any headache, seizures, significant head injury with loss of consciousness.  There is no family history of anybody with Alzheimer's or other dementias.  Patient is able to ambulate with a cane she did not fall.  She remains on Plavix for stroke prevention this complain of bruising but no bleeding.  Blood pressures well controlled today it is 116/76.  Recent lab work for hemoglobin A1c on 05/20/2021 was satisfactory at 5.86 and LDL cholesterol on 12/26/2020 was 61 mg percent.  She was last seen in office for follow-up of on 07/04/2018 and  then lost to follow-up. Update 07/23/2022: She returns for follow-up after last visit more than a year ago.  She states that short-term memory difficulties are about unchanged.  She has learned to be more organized and mentally challenging activities and feels that she is doing all right.  She has not noticed any significant worsening.  She states her blood pressure is under good control and today it is 127/92.  She has had a couple of falls.  In November she fell and fractured her left clavicle which was treated conservatively appears to healing.  She again fell last Tuesday minor fall and did not hurt herself.  She had MRI scan of the brain on 06/30/2021 showed old right basal ganglia hemorrhage.  Mild changes of chronic small vessel disease and no acute abnormalities.  MR angiogram of the brain and neck were both unremarkable.  On 07/10/2021 was normal lab work on 6/29 due to showed normal vitamin B12, TSH, RPR and homocystine levels.  Mental status exam testing today she scored 25/30 which is slightly lower than 27/30 at last visit.  She has no new complaints.  She is living with her husband.  She is able to ambulate with a cane and is mostly independent in activities of daily living.  ROS:   14 system review of systems is positive for falls, memory loss, forgetfulness, confusion, weakness, gait difficulty, bruising and all other systems negative  PMH:  Past Medical History:  Diagnosis Date   Anemia    Anxiety and depression    Back  pain    Essential hypertension 10/19/2007   03-2010: metoprolol changed to bystolic (was not feeling well on it, no specific allergy or reaction)     Food allergy    Hemiplegia (HCC)    Hyperlipemia    Hypertension    Joint pain    Migraine    Neuromuscular disorder ([place])    left sided hemiplegia - has brace for l leg and walks with a cane   Obesity    Ovarian cancer ([place])    Prediabetes    Sleep apnea    not wearing c-pap yet   Sleep apnea    Stroke ([place]) 2017    Vitamin D deficiency     Social History:  Social History   Socioeconomic History   Marital status: Married    Spouse name: Abigail Campos   Number of children: 2   Years of education: Not on file   Highest education level: Not on file  Occupational History   Occupation: disable d/t stroke TEACHER    Employer: [employer]  Tobacco Use   Smoking status: Never   Smokeless tobacco: Never  Vaping Use   Vaping Use: Never used  Substance and Sexual Activity   Alcohol use: No   Drug use: No   Sexual activity: Not Currently    Birth control/protection: Surgical  Other Topics Concern   Not on file  Social History Narrative   Lives with husband and son (2007)   Left Hand prior to stroke   Drinks 1-2 cups daily   Daughter (1998), married    Social Determinants of Health   Financial Resource Strain: Low Risk  (04/17/2022)   Overall Financial Resource Strain (CARDIA)    Difficulty of Paying Living Expenses: Not hard at all  Food Insecurity: No Food Insecurity (04/17/2022)   Hunger Vital Sign    Worried About Running Out of Food in the Last Year: Never true    [place] in the Last Year: Never true  Transportation Needs: No Transportation Needs (04/17/2022)   PRAPARE - Hydrologist (Medical): No    Lack of Transportation (Non-Medical): No  Physical Activity: Sufficiently Active (04/17/2022)   Exercise Vital Sign    Days of Exercise per Week: 7 days    Minutes of Exercise per Session: 30 min  Stress: No Stress Concern Present (04/17/2022)   [place]    Feeling of Stress : Only a little  Social Connections: Moderately Integrated (04/17/2022)   Social Connection and Isolation Panel [NHANES]    Frequency of Communication with Friends and Family: More than three times a week    Frequency of Social Gatherings with Friends and Family: More than three times a week    Attends Religious Services: More  than 4 times per year    Active Member of [employer] or Organizations: No    Attends Archivist Meetings: Never    Marital Status: Married  Human resources officer Violence: Not At Risk (04/17/2022)   Humiliation, Afraid, Rape, and Kick questionnaire    Fear of Current or Ex-Partner: No    Emotionally Abused: No    Physically Abused: No    Sexually Abused: No    Medications:   Current Outpatient Medications on File Prior to Visit  Medication Sig Dispense Refill   atorvastatin (LIPITOR) 20 MG tablet TAKE 1 TABLET BY MOUTH EVERY DAY 90 tablet 1   baclofen (LIORESAL) 10 MG tablet  TAKE 3 TABLETS (30 MG TOTAL) BY MOUTH 3 (THREE) TIMES DAILY. 810 tablet 1   buPROPion (WELLBUTRIN) 100 MG tablet TAKE 1 TABLET BY MOUTH TWICE A DAY 180 tablet 1   carvedilol (COREG) 25 MG tablet Take 1 tablet (25 mg total) by mouth 2 (two) times daily with a meal. 180 tablet 1   clopidogrel (PLAVIX) 75 MG tablet TAKE 1 TABLET BY MOUTH EVERY DAY 90 tablet 1   escitalopram (LEXAPRO) 20 MG tablet TAKE 1 TABLET BY MOUTH EVERY DAY 90 tablet 1   gabapentin (NEURONTIN) 300 MG capsule TAKE 1 CAPSULE BY MOUTH THREE TIMES A DAY 90 capsule 1   losartan (COZAAR) 100 MG tablet Take 1 tablet (100 mg total) by mouth daily. 90 tablet 1   traZODone (DESYREL) 100 MG tablet TAKE 1 TABLET BY MOUTH AT BEDTIME AS NEEDED FOR SLEEP. 90 tablet 1   No current facility-administered medications on file prior to visit.    Allergies:   Allergies  Allergen Reactions   Bee Venom Anaphylaxis   Peanut-Containing Drug Products Anaphylaxis   Aspirin Other (See Comments)    Reports blisters w/ ASA but pt reports is ok w/ Motrin, Advil, naproxen   Isovue [Iopamidol] Hives and Itching    Pt broke out with one facial hive.  Had itching on her right upper ant and posterior arm w/o evidence of hives.  Pt given water and '25mg'$  benadryl po.  We observed pt for 30 minutes before d/c.  J. Bohm     Latex Hives   Ambien [Zolpidem Tartrate] Other (See  Comments)    forgetfulness   Hydrocodone Itching    Generalized itching w/o rash    Physical Exam General: Mildly obese middle-aged African-American lady, seated, in no evident distress Head: head normocephalic and atraumatic.   Neck: supple with no carotid or supraclavicular bruits Cardiovascular: regular rate and rhythm, no murmurs Musculoskeletal: no deformity Skin:  no rash/petichiae Vascular:  Normal pulses all extremities  Neurologic Exam Mental Status: Awake and fully alert. Oriented to place and time. Recent and remote memory intact. Attention span, concentration and fund of knowledge appropriate. Mood and affect appropriate.  Recall 3/3.  Able to name 13 animals which can walk on 4 legs.  Clock drawing 4/4.  Mini-Mental status exam score 25/30 ( last visit 27/30 )with deficits in attention and calculation and recall only.   . Cranial Nerves: Fundoscopic exam reveals sharp disc margins. Pupils equal, briskly reactive to light. Extraocular movements full without nystagmus. Visual fields full to confrontation. Hearing intact. Facial sensation intact. Face, tongue, palate moves normally and symmetrically.  Motor: Spastic left hemiplegia with left upper extremity 0/5 strength in left lower extremity 4/5 strength except left ankle foot drop.  Tone is increased in the left leg mild spasticity.  Normal strength on the right. Sensory.:  Diminished left hemibody touch , pinprick , position and vibratory sensation.  Coordination: Rapid alternating movements normal on the right and cannot be tested appropriately on the left.   Gait and Station: Arises from chair without difficulty. Stance is normal. Gait is spastic ataxic with circumduction and left foot drop and is wearing a left foot brace..  Reflexes: 1+ and symmetric. Toes downgoing.   NIHSS 6 Modified Rankin 3      07/23/2022    9:28 AM 06/11/2021   11:10 AM  MMSE - Mini Mental State Exam  Orientation to time 5 5  Orientation to  Place 5 5  Registration 3 3  Attention/  Calculation 2 3  Recall 2 2  Language- name 2 objects 2 2  Language- repeat 1 1  Language- follow 3 step command 3 3  Language- read & follow direction 1 1  Write a sentence 1 1  Copy design 0 1  Total score 25 27     ASSESSMENT: 51 year old African-American lady with spastic left hemiplegia secondary to right basal ganglia hypertensive hemorrhage and April 2017 but now complains of subjective memory and cognitive worsening likely due to mild cognitive impairment and suboptimally treated underlying depression.     PLAN:I had a long discussion with the patient with regards to her memory loss and cognitive impairment and discussed plan for evaluation and treatment and answered questions. We also discussed memory compensation strategies and I advised her to increase participation in cognitively challenging activities like solving crossword puzzles, playing bridge and sodoku.  She was advised to follow-up with her primary care physician for more optimal treatment for her depression which could also be contributing.  I recommend further evaluation by checking memory panel labs, EEG and MRI scan of the brain with without contrast and MR angiogram of the brain and neck.  Continue Plavix for stroke prevention and maintain aggressive risk factor modification with strict control of hypertension with blood pressure goal below 130/90, lipids with LDL cholesterol goal below 70 mg percent and diabetes with hemoglobin A1c goal below 6.5%.  She will return for follow-up in the future in 2 months or call earlier if necessary. Greater than 50% time during this 45-minute visit was spent on counseling and coordination of care about her remote stroke, spastic hemiplegia, falls and memory loss and cognitive impairment and answering questions.  Antony Contras MD  Note: This document was prepared with digital dictation and possible smart phrase technology. Any transcriptional  errors that result from this process are unintentional.

## 2022-07-23 NOTE — Patient Instructions (Signed)
I had a long discussion with the patient with regards to her remote intracerebral hemorrhage and spastic left hemiplegia as well as mild cognitive impairment both of which appear to be quite stable.  Strict control of hypertension with blood pressure goal below 130/90.  She was advised to have a cane at all times and we also discussed fall prevention precautions.  I advised her to please participation in cognitively challenging  activities like solving crossword puzzles, playing bridge and sudoku.  We also discussed memory compensation strategies.Start Prevagen 10 mg daily to improve memory.  Return for follow-up in 1 year or call earlier if necessary.  Memory Compensation Strategies  Use "WARM" strategy.  W= write it down  A= associate it  R= repeat it  M= make a mental note  2.   You can keep a Social worker.  Use a 3-ring notebook with sections for the following: calendar, important names and phone numbers,  medications, doctors' names/phone numbers, lists/reminders, and a section to journal what you did  each day.   3.    Use a calendar to write appointments down.  4.    Write yourself a schedule for the day.  This can be placed on the calendar or in a separate section of the Memory Notebook.  Keeping a  regular schedule can help memory.  5.    Use medication organizer with sections for each day or morning/evening pills.  You may need help loading it  6.    Keep a basket, or pegboard by the door.  Place items that you need to take out with you in the basket or on the pegboard.  You may also want to  include a message board for reminders.  7.    Use sticky notes.  Place sticky notes with reminders in a place where the task is performed.  For example: " turn off the  stove" placed by the stove, "lock the door" placed on the door at eye level, " take your medications" on  the bathroom mirror or by the place where you normally take your medications.  8.    Use alarms/timers.  Use while  cooking to remind yourself to check on food or as a reminder to take your medicine, or as a  reminder to make a call, or as a reminder to perform another task, etc.   Fall Prevention in the Home, Adult Falls can cause injuries and can happen to people of all ages. There are many things you can do to make your home safe and to help prevent falls. Ask for help when making these changes. What actions can I take to prevent falls? General Instructions Use good lighting in all rooms. Replace any light bulbs that burn out. Turn on the lights in dark areas. Use night-lights. Keep items that you use often in easy-to-reach places. Lower the shelves around your home if needed. Set up your furniture so you have a clear path. Avoid moving your furniture around. Do not have throw rugs or other things on the floor that can make you trip. Avoid walking on wet floors. If any of your floors are uneven, fix them. Add color or contrast paint or tape to clearly mark and help you see: Grab bars or handrails. First and last steps of staircases. Where the edge of each step is. If you use a stepladder: Make sure that it is fully opened. Do not climb a closed stepladder. Make sure the sides of the stepladder are  locked in place. Ask someone to hold the stepladder while you use it. Know where your pets are when moving through your home. What can I do in the bathroom?     Keep the floor dry. Clean up any water on the floor right away. Remove soap buildup in the tub or shower. Use nonskid mats or decals on the floor of the tub or shower. Attach bath mats securely with double-sided, nonslip rug tape. If you need to sit down in the shower, use a plastic, nonslip stool. Install grab bars by the toilet and in the tub and shower. Do not use towel bars as grab bars. What can I do in the bedroom? Make sure that you have a light by your bed that is easy to reach. Do not use any sheets or blankets for your bed that  hang to the floor. Have a firm chair with side arms that you can use for support when you get dressed. What can I do in the kitchen? Clean up any spills right away. If you need to reach something above you, use a step stool with a grab bar. Keep electrical cords out of the way. Do not use floor polish or wax that makes floors slippery. What can I do with my stairs? Do not leave any items on the stairs. Make sure that you have a light switch at the top and the bottom of the stairs. Make sure that there are handrails on both sides of the stairs. Fix handrails that are broken or loose. Install nonslip stair treads on all your stairs. Avoid having throw rugs at the top or bottom of the stairs. Choose a carpet that does not hide the edge of the steps on the stairs. Check carpeting to make sure that it is firmly attached to the stairs. Fix carpet that is loose or worn. What can I do on the outside of my home? Use bright outdoor lighting. Fix the edges of walkways and driveways and fix any cracks. Remove anything that might make you trip as you walk through a door, such as a raised step or threshold. Trim any bushes or trees on paths to your home. Check to see if handrails are loose or broken and that both sides of all steps have handrails. Install guardrails along the edges of any raised decks and porches. Clear paths of anything that can make you trip, such as tools or rocks. Have leaves, snow, or ice cleared regularly. Use sand or salt on paths during winter. Clean up any spills in your garage right away. This includes grease or oil spills. What other actions can I take? Wear shoes that: Have a low heel. Do not wear high heels. Have rubber bottoms. Feel good on your feet and fit well. Are closed at the toe. Do not wear open-toe sandals. Use tools that help you move around if needed. These include: Canes. Walkers. Scooters. Crutches. Review your medicines with your doctor. Some  medicines can make you feel dizzy. This can increase your chance of falling. Ask your doctor what else you can do to help prevent falls. Where to find more information Centers for Disease Control and Prevention, [employer]: http://www.wolf.info/ National Institute on Aging: http://kim-miller.com/ Contact a doctor if: You are afraid of falling at home. You feel weak, drowsy, or dizzy at home. You fall at home. Summary There are many simple things that you can do to make your home safe and to help prevent falls. Ways to make your home  safe include removing things that can make you trip and installing grab bars in the bathroom. Ask for help when making these changes in your home. This information is not intended to replace advice given to you by your health care provider. Make sure you discuss any questions you have with your health care provider. Document Revised: 09/01/2021 Document Reviewed: 07/03/2020 Elsevier Patient Education  Abigail Campos.

## 2022-07-30 ENCOUNTER — Other Ambulatory Visit: Payer: Self-pay | Admitting: Internal Medicine

## 2022-07-30 MED ORDER — LOSARTAN POTASSIUM 100 MG PO TABS: 100.0000 mg | ORAL_TABLET | Freq: Every day | ORAL | 1 refills | Status: DC

## 2022-07-31 ENCOUNTER — Encounter: Payer: Self-pay | Admitting: Internal Medicine

## 2022-08-03 DIAGNOSIS — R632 Polyphagia: Secondary | ICD-10-CM | POA: Diagnosis not present

## 2022-08-03 DIAGNOSIS — K760 Fatty (change of) liver, not elsewhere classified: Secondary | ICD-10-CM | POA: Diagnosis not present

## 2022-08-03 DIAGNOSIS — Z6829 Body mass index (BMI) 29.0-29.9, adult: Secondary | ICD-10-CM | POA: Diagnosis not present

## 2022-08-03 DIAGNOSIS — E663 Overweight: Secondary | ICD-10-CM | POA: Diagnosis not present

## 2022-08-03 DIAGNOSIS — I10 Essential (primary) hypertension: Secondary | ICD-10-CM | POA: Diagnosis not present

## 2022-08-03 DIAGNOSIS — Z8673 Personal history of transient ischemic attack (TIA), and cerebral infarction without residual deficits: Secondary | ICD-10-CM | POA: Diagnosis not present

## 2022-08-03 DIAGNOSIS — Z8639 Personal history of other endocrine, nutritional and metabolic disease: Secondary | ICD-10-CM | POA: Diagnosis not present

## 2022-08-11 ENCOUNTER — Encounter: Payer: Self-pay | Admitting: Physical Medicine & Rehabilitation

## 2022-08-11 ENCOUNTER — Encounter
Payer: No Typology Code available for payment source | Attending: Physical Medicine & Rehabilitation | Admitting: Physical Medicine & Rehabilitation

## 2022-08-11 VITALS — BP 135/92 | HR 76 | Ht <= 58 in | Wt 139.0 lb

## 2022-08-11 DIAGNOSIS — G8114 Spastic hemiplegia affecting left nondominant side: Secondary | ICD-10-CM | POA: Diagnosis not present

## 2022-08-11 NOTE — Progress Notes (Addendum)
Subjective:    Patient ID: Abigail Campos, female    DOB: 01-03-1971, 51 y.o.   MRN: [id number]  HPI 51 year old female with history of chronic left spastic hemiplegia secondary to CVA in 2017, had Dysport injection performed by Dr. Posey Pronto  The patient states that her arm foot and ankle are doing okay but the finger flexors are still tight.  She also notes some thumb flexor tightness. She had no adverse effects of the injection. She does not do much stretching these days and no longer wears her resting hand splint.  She did have a custom splint made for her at one point.  She also has a off-the-shelf model as well.  Dysport 06/26/22 Left FCR: 200 units   Left FDS: 400 units Left FDP: 300units Left FPL   100u  Finger and wrist doing better per pt  Has had 2 falls in interval , one at a restaurant and once in house last Saturday , had bruises but no other issues  Has been on Baclofen since 2017  Denies dizziness or lightheadedness prior to fall  Pain Inventory Average Pain 5 Pain Right Now 5 My pain is sharp and aching  In the last 24 hours, has pain interfered with the following? General activity 0 Relation with others 0 Enjoyment of life 0 What TIME of day is your pain at its worst? morning  and evening Sleep (in general) Poor  Pain is worse with:  nothing Pain improves with:  nothing Relief from Meds:  na  Family History  Problem Relation Age of Onset   Diabetes Mother    Hypertension Mother    Glaucoma Mother    Colon cancer Mother        mother age 52   Cancer Mother    Anxiety disorder Mother    Obesity Mother    Rectal cancer Father        dx 35   Hypertension Father    Cancer Father    Sleep apnea Father    Alcoholism Father    Obesity Father    Stroke Paternal Grandfather    Stroke Maternal Grandfather    Diabetes Brother    Hypertension Brother    Heart disease Maternal Aunt        x 2 did 2012 CAD-CHF   Diabetes Brother    Hypertension  Brother    Lung cancer Maternal Aunt    Breast cancer Neg Hx    Esophageal cancer Neg Hx    Stomach cancer Neg Hx    Social History   Socioeconomic History   Marital status: Married    Spouse name: [name]   Number of children: 2   Years of education: Not on file   Highest education level: Not on file  Occupational History   Occupation: disable d/t stroke TEACHER    Employer: [employer]  Tobacco Use   Smoking status: Never   Smokeless tobacco: Never  Vaping Use   Vaping Use: Never used  Substance and Sexual Activity   Alcohol use: No   Drug use: No   Sexual activity: Not Currently    Birth control/protection: Surgical  Other Topics Concern   Not on file  Social History Narrative   Lives with husband and son (2007)   Left Hand prior to stroke   Drinks 1-2 cups daily   Daughter (1998), married    Social Determinants of Health   Financial Resource Strain: Low Risk  (04/17/2022)   Overall  Financial Resource Strain (CARDIA)    Difficulty of Paying Living Expenses: Not hard at all  Food Insecurity: No Food Insecurity (04/17/2022)   Hunger Vital Sign    Worried About Running Out of Food in the Last Year: Never true    Ran Out of Food in the Last Year: Never true  Transportation Needs: No Transportation Needs (04/17/2022)   PRAPARE - Hydrologist (Medical): No    Lack of Transportation (Non-Medical): No  Physical Activity: Sufficiently Active (04/17/2022)   Exercise Vital Sign    Days of Exercise per Week: 7 days    Minutes of Exercise per Session: 30 min  Stress: No Stress Concern Present (04/17/2022)   [place]    Feeling of Stress : Only a little  Social Connections: Moderately Integrated (04/17/2022)   Social Connection and Isolation Panel [NHANES]    Frequency of Communication with Friends and Family: More than three times a week    Frequency of Social Gatherings with Friends and  Family: More than three times a week    Attends Religious Services: More than 4 times per year    Active Member of Clubs or Organizations: No    Attends Archivist Meetings: Never    Marital Status: Married   Past Surgical History:  Procedure Laterality Date   ABDOMINAL HYSTERECTOMY  11-14-07   no oophorectomy per surgical report   CESAREAN SECTION     [id number]   INGUINAL HERNIA REPAIR     2004   Past Surgical History:  Procedure Laterality Date   ABDOMINAL HYSTERECTOMY  11-14-07   no oophorectomy per surgical report   CESAREAN SECTION     [id number]   INGUINAL HERNIA REPAIR     2004   Past Medical History:  Diagnosis Date   Anemia    Anxiety and depression    Back pain    Essential hypertension 10/19/2007   03-2010: metoprolol changed to bystolic (was not feeling well on it, no specific allergy or reaction)     Food allergy    Hemiplegia (HCC)    Hyperlipemia    Hypertension    Joint pain    Migraine    Neuromuscular disorder ([place])    left sided hemiplegia - has brace for l leg and walks with a cane   Obesity    Ovarian cancer ([place])    Prediabetes    Sleep apnea    not wearing c-pap yet   Sleep apnea    Stroke ([place]) 2017   Vitamin D deficiency    BP (!) 135/92   Pulse 76   Ht '4\' 10"'$  (1.473 m)   Wt 139 lb (63 kg)   SpO2 96%   BMI 29.05 kg/m   Opioid Risk Score:   Fall Risk Score:  `1  Depression screen PHQ 2/9     06/26/2022   11:10 AM 04/17/2022    8:20 AM 04/01/2022    1:16 PM 03/19/2022    1:13 PM 12/19/2021   12:40 PM 09/30/2021   10:41 AM 08/29/2021    1:19 PM  Depression screen PHQ 2/9  Decreased Interest 0 0 0 0 '1 1 1  '$ Down, Depressed, Hopeless 0 0 0 0 '1 1 1  '$ PHQ - 2 Score 0 0 0 0 '2 2 2  '$ Altered sleeping   3   3   Tired, decreased energy   1   1  Change in appetite   0   0   Feeling bad or failure about yourself    1   1   Trouble concentrating   3   3   Moving slowly or fidgety/restless   0   0   Suicidal thoughts   0   0   PHQ-9  Score   8   10       Review of Systems  Musculoskeletal:        Left arm pain Left foot pain Buttocks pain  All other systems reviewed and are negative.     Objective:   Physical Exam Vitals and nursing note reviewed.  Constitutional:      Appearance: She is normal weight.  HENT:     Head: Normocephalic and atraumatic.  Eyes:     Extraocular Movements: Extraocular movements intact.     Conjunctiva/sclera: Conjunctivae normal.     Pupils: Pupils are equal, round, and reactive to light.  Musculoskeletal:     Comments: No pain with upper extremity or lower extremity range of motion No evidence of frozen shoulder or shoulder subluxation.  Skin:    General: Skin is warm and dry.  Neurological:     Mental Status: She is alert and oriented to person, place, and time.     Comments: Tone MAS 3 in the left finger flexors MAS 2 at the left thumb flexor MAS 0 at the wrist flexor Motor strength is trace finger flexion 0 finger extension to minus elbow flexion ambulates with a cane no regular knee instability  Psychiatric:        Mood and Affect: Mood normal.        Behavior: Behavior normal.           Assessment & Plan:   1.  Left spastic hemiplegia severe she is at risk for double developing finger contracture wrist hand orthosis ordered, repeat Dysport in 6 weeks  2.  Left spastic foot drop related to CVA with falls x 2 Pt seeing PT as outpt for gait training  "patient requires a new AFO as her current brace is no longer appropriate for her due to patient weight loss and the brace is unable to be adjusted to accommodate patient change in body habitus. Pt's current brace is too short on her tibia and poorly controls her knee in stance phase, leading to frequent falls. With trial of a new SpryStep Plus the patient exhibits decreased LLE ER and improved knee stability

## 2022-08-11 NOTE — Patient Instructions (Signed)
Please go to [facility] for resting hand splint to be worn at night

## 2022-08-12 ENCOUNTER — Encounter: Payer: Self-pay | Admitting: Internal Medicine

## 2022-08-12 ENCOUNTER — Ambulatory Visit
Admission: EM | Admit: 2022-08-12 | Discharge: 2022-08-12 | Disposition: A | Discharge status: Home / Self Care | Payer: No Typology Code available for payment source | Attending: Emergency Medicine | Admitting: Emergency Medicine

## 2022-08-12 ENCOUNTER — Ambulatory Visit (HOSPITAL_BASED_OUTPATIENT_CLINIC_OR_DEPARTMENT_OTHER)
Admission: RE | Admit: 2022-08-12 | Discharge: 2022-08-12 | Disposition: A | Discharge status: Home / Self Care | Payer: No Typology Code available for payment source | Source: Ambulatory Visit | Attending: Emergency Medicine | Admitting: Emergency Medicine

## 2022-08-12 DIAGNOSIS — M79672 Pain in left foot: Secondary | ICD-10-CM | POA: Diagnosis not present

## 2022-08-12 DIAGNOSIS — S99922A Unspecified injury of left foot, initial encounter: Secondary | ICD-10-CM | POA: Diagnosis not present

## 2022-08-12 DIAGNOSIS — W19XXXA Unspecified fall, initial encounter: Secondary | ICD-10-CM | POA: Insufficient documentation

## 2022-08-12 DIAGNOSIS — R29898 Other symptoms and signs involving the musculoskeletal system: Secondary | ICD-10-CM | POA: Diagnosis not present

## 2022-08-12 DIAGNOSIS — M7989 Other specified soft tissue disorders: Secondary | ICD-10-CM | POA: Insufficient documentation

## 2022-08-12 DIAGNOSIS — R531 Weakness: Secondary | ICD-10-CM | POA: Diagnosis not present

## 2022-08-12 DIAGNOSIS — Z8673 Personal history of transient ischemic attack (TIA), and cerebral infarction without residual deficits: Secondary | ICD-10-CM | POA: Diagnosis not present

## 2022-08-12 DIAGNOSIS — R296 Repeated falls: Secondary | ICD-10-CM | POA: Insufficient documentation

## 2022-08-12 NOTE — ED Provider Notes (Addendum)
[facility]    [name]: [id number] Arrival date & time: 08/12/22  1040    HISTORY   Chief Complaint  Patient presents with   Foot Pain   HPI Abigail Campos is a pleasant, 51 y.o. female who presents to urgent care today. The patient states she tripped on a floor rug yesterday and she states her left foot bent awkwardly. The patient states she has swelling and pain in her left foot. The patient states she is not able to bare weight on the foot 2/2 pain. The patient reports a hx of stroke with residual left sided focal deficits.  Patient states she is fallen multiple times in the past months due to the weakness in her left lower extremity.  Per EMR, patient was seen at a different urgent care about 18 months ago for a similar complaint.  The history is provided by the patient.   Past Medical History:  Diagnosis Date   Anemia    Anxiety and depression    Back pain    Essential hypertension 10/19/2007   03-2010: metoprolol changed to bystolic (was not feeling well on it, no specific allergy or reaction)     Food allergy    Hemiplegia (HCC)    Hyperlipemia    Hypertension    Joint pain    Migraine    Neuromuscular disorder ([place])    left sided hemiplegia - has brace for l leg and walks with a cane   Obesity    Ovarian cancer ([place])    Prediabetes    Sleep apnea    not wearing c-pap yet   Sleep apnea    Stroke [facility]) 2017   Vitamin D deficiency    Patient Active Problem List   Diagnosis Date Noted   Depression, major, single episode, severe ([place]) 07/23/2022   Nausea without vomiting 07/23/2021   Vitamin D deficiency 07/06/2021   History of CVA (cerebrovascular accident) 07/06/2021   Other constipation 03/18/2021   Hot flashes 03/17/2021   Prediabetes 02/11/2021   COVID-19 41/74/0814   Metabolic syndrome 48/18/5631   Neuropathic pain 09/19/2020   Adjustment disorder with mixed anxiety and depressed mood 09/19/2020   Facial droop 08/30/2020   Abnormality of  gait 05/09/2020   Hemiplegia of nondominant side, late effect of cerebrovascular disease ([place]) 03/28/2020   Pleural effusion 03/01/2020   Sleep disturbance 02/06/2020   Spastic hemiplegia of left nondominant side due to nontraumatic intraparenchymal hemorrhage of brain ([place]) 12/26/2019   Trigger thumb of right hand 11/02/2019   OSA (obstructive sleep apnea) 07/26/2019   Spastic hemiplegia of left nondominant side as late effect of nontraumatic intraparenchymal hemorrhage of brain ([place]) 04/06/2019   Spastic hemiparesis affecting dominant side ([place]) 01/04/2018   Hyperglycemia 12/29/2016   Depression 12/29/2016   Muscle spasticity    Class 1 obesity with serious comorbidity and body mass index (BMI) of 33.0 to 33.9 in adult 04/08/2016   Hyperlipidemia 04/08/2016   Basal ganglia hemorrhage ([place]) 04/08/2016   Dysphagia as late effect of cerebrovascular disease    Migraine with aura and without status migrainosus, not intractable    Gait disturbance, post-stroke    Hemiplegia, post-stroke (HCC)    Aphasia, post-stroke    Dysarthria, post-stroke    ICH (intracerebral hemorrhage) ([place]) - R basal ganglia due to hypertensive emergency 04/01/2016   Upper airway cough syndrome 12/06/2015   PCP NOTES >>>>>>>>>>>>>>>>>>>>>>>>>>>.. 09/25/2015   Insomnia 12/28/2012   Annual physical exam 11/23/2011   Essential hypertension 10/19/2007  Past Surgical History:  Procedure Laterality Date   ABDOMINAL HYSTERECTOMY  11-14-07   no oophorectomy per surgical report   CESAREAN SECTION     [id number]   INGUINAL HERNIA REPAIR     2004   OB History     Gravida  2   Para  2   Term  2   Preterm      AB      Living  2      SAB      IAB      Ectopic      Multiple      Live Births  2          Home Medications    Prior to Admission medications   Medication Sig Start Date End Date Taking? Authorizing Provider  atorvastatin (LIPITOR) 20 MG tablet TAKE 1 TABLET BY MOUTH EVERY DAY  06/25/22   Colon Branch, MD  baclofen (LIORESAL) 10 MG tablet TAKE 3 TABLETS (30 MG TOTAL) BY MOUTH 3 (THREE) TIMES DAILY. 01/26/22   Kirsteins, Luanna Salk, MD  buPROPion (WELLBUTRIN) 100 MG tablet TAKE 1 TABLET BY MOUTH TWICE A DAY 07/30/22   Colon Branch, MD  carvedilol (COREG) 25 MG tablet Take 1 tablet (25 mg total) by mouth 2 (two) times daily with a meal. 06/02/22   Colon Branch, MD  clopidogrel (PLAVIX) 75 MG tablet TAKE 1 TABLET BY MOUTH EVERY DAY 01/26/22   Colon Branch, MD  escitalopram (LEXAPRO) 20 MG tablet TAKE 1 TABLET BY MOUTH EVERY DAY 07/30/22   Colon Branch, MD  gabapentin (NEURONTIN) 300 MG capsule TAKE 1 CAPSULE BY MOUTH THREE TIMES A DAY 04/29/22   Kirsteins, Luanna Salk, MD  losartan (COZAAR) 100 MG tablet Take 1 tablet (100 mg total) by mouth daily. 07/30/22   Colon Branch, MD  traZODone (DESYREL) 100 MG tablet TAKE 1 TABLET BY MOUTH AT BEDTIME AS NEEDED FOR SLEEP. 04/29/22   Colon Branch, MD    Family History Family History  Problem Relation Age of Onset   Diabetes Mother    Hypertension Mother    Glaucoma Mother    Colon cancer Mother        mother age 58   Cancer Mother    Anxiety disorder Mother    Obesity Mother    Rectal cancer Father        dx 56   Hypertension Father    Cancer Father    Sleep apnea Father    Alcoholism Father    Obesity Father    Stroke Paternal Grandfather    Stroke Maternal Grandfather    Diabetes Brother    Hypertension Brother    Heart disease Maternal Aunt        x 2 did 2012 CAD-CHF   Diabetes Brother    Hypertension Brother    Lung cancer Maternal Aunt    Breast cancer Neg Hx    Esophageal cancer Neg Hx    Stomach cancer Neg Hx    Social History Social History   Tobacco Use   Smoking status: Never   Smokeless tobacco: Never  Vaping Use   Vaping Use: Never used  Substance Use Topics   Alcohol use: No   Drug use: No   Allergies   Bee venom, Peanut-containing drug products, Aspirin, Isovue [iopamidol], Latex, Ambien [zolpidem  tartrate], and Hydrocodone  Review of Systems Review of Systems Pertinent findings revealed after performing a 14 point review of systems  has been noted in the history of present illness.  Physical Exam Triage Vital Signs ED Triage Vitals  Enc Vitals Group     BP 10/10/21 0827 (!) 147/82     Pulse Rate 10/10/21 0827 72     Resp 10/10/21 0827 18     Temp 10/10/21 0827 98.3 F (36.8 C)     Temp Source 10/10/21 0827 Oral     SpO2 10/10/21 0827 98 %     Weight --      Height --      Head Circumference --      Peak Flow --      Pain Score 10/10/21 0826 5     Pain Loc --      Pain Edu? --      Excl. in [place]? --    Updated Vital Signs BP 129/79 (BP Location: Right Arm)   Pulse 79   Resp 18   SpO2 96%   Physical Exam Vitals and nursing note reviewed.  Constitutional:      General: She is not in acute distress.    Appearance: Normal appearance.  HENT:     Head: Normocephalic and atraumatic.  Eyes:     Pupils: Pupils are equal, round, and reactive to light.  Cardiovascular:     Rate and Rhythm: Normal rate and regular rhythm.  Pulmonary:     Effort: Pulmonary effort is normal.     Breath sounds: Normal breath sounds.  Musculoskeletal:     Cervical back: Normal range of motion and neck supple.     Right ankle: Normal.     Right Achilles Tendon: Normal.     Left ankle: Normal.     Left Achilles Tendon: Normal.     Right foot: Normal.     Left foot: Decreased range of motion. Normal capillary refill. Swelling, foot drop, tenderness and bony tenderness present. No deformity, bunion, Charcot foot, prominent metatarsal heads, laceration or crepitus. Normal pulse.  Skin:    General: Skin is warm and dry.  Neurological:     General: No focal deficit present.     Mental Status: She is alert and oriented to person, place, and time. Mental status is at baseline.  Psychiatric:        Mood and Affect: Mood normal.        Behavior: Behavior normal.        Thought Content: Thought  content normal.        Judgment: Judgment normal.     UC Couse / Diagnostics / Procedures:     Radiology DG Foot Complete Left  Result Date: 08/12/2022 CLINICAL DATA:  Repeated falls injury left foot secondary to left lower extremity weakness. Fall last night with left foot pain and swelling across the 1st to 5th metatarsals. EXAM: LEFT FOOT - COMPLETE 3+ VIEW COMPARISON:  Left foot radiograph 02/07/2021 FINDINGS: There is no evidence of fracture or dislocation. Mild osteoarthritis of the great toe MTP joint. Distal osteopenia throughout the forefoot. IMPRESSION: No acute osseous abnormality. Electronically Signed   By: Ileana Roup M.D.   On: 08/12/2022 13:33    Procedures Procedures (including critical care time) EKG  Pending results:  Labs Reviewed - No data to display  Medications Ordered in UC: Medications - No data to display  UC Diagnoses / Final Clinical Impressions(s)   I have reviewed the triage vital signs and the nursing notes.  Pertinent labs & imaging results that were available during my care of the  patient were reviewed by me and considered in my medical decision making (see chart for details).    Final diagnoses:  Foot injury, left, initial encounter  Weakness of left lower extremity  History of stroke   Patient was advised to [facility] to have her x-ray done because we do not have an x-ray tech here in the clinic today.  Patient was placed in a stabilizing ankle brace to prevent further falls.  X-ray was unremarkable for any acute bony fracture but there is a history of osteopenia and arthritis.  Patient will be advised to follow-up with her PCP regarding these findings.  ED Prescriptions   None    PDMP not reviewed this encounter.  Discharge Instructions:   Discharge Instructions      Please go to [facility]	[place] to have x-ray of your left foot performed.  Please wear the stabilizing ankle brace that  we provided for you today at all times until further instructed.  You are welcome to keep your foot elevated and ice it is much as possible to get the swelling down.  We will contact you with the results of your x-ray as soon as we have received it and provide further recommendations based on those results.      Disposition Upon Discharge:  Condition: stable for discharge home Home: take medications as prescribed; routine discharge instructions as discussed; follow up as advised.  Patient presented with an acute illness with associated systemic symptoms and significant discomfort requiring urgent management. In my opinion, this is a condition that a prudent lay person (someone who possesses an average knowledge of health and medicine) may potentially expect to result in complications if not addressed urgently such as respiratory distress, impairment of bodily function or dysfunction of bodily organs.   Routine symptom specific, illness specific and/or disease specific instructions were discussed with the patient and/or caregiver at length.   As such, the patient has been evaluated and assessed, work-up was performed and treatment was provided in alignment with urgent care protocols and evidence based medicine.  Patient/parent/caregiver has been advised that the patient may require follow up for further testing and treatment if the symptoms continue in spite of treatment, as clinically indicated and appropriate.  Patient/parent/caregiver has been advised to report to orthopedic urgent care clinic or return to the [facility] or PCP in 3-5 days if no better; follow-up with orthopedics, PCP or the Emergency Department if new signs and symptoms develop or if the current signs or symptoms continue to change or worsen for further workup, evaluation and treatment as clinically indicated and appropriate  The patient will follow up with their current PCP if and as advised. If the patient does not currently have a  PCP we will have assisted them in obtaining one.   The patient may need specialty follow up if the symptoms continue, in spite of conservative treatment and management, for further workup, evaluation, consultation and treatment as clinically indicated and appropriate.  Patient/parent/caregiver verbalized understanding and agreement of plan as discussed.  All questions were addressed during visit.  Please see discharge instructions below for further details of plan.  This office note has been dictated using Museum/gallery curator.  Unfortunately, this method of dictation can sometimes lead to typographical or grammatical errors.  I apologize for your inconvenience in advance if this occurs.  Please do not hesitate to reach out to me if clarification is needed.      Lilia Pro,  Tamsen Snider, PA-C 08/12/22 1447    Lynden Oxford Scales, PA-C 08/12/22 [phone]

## 2022-08-12 NOTE — ED Triage Notes (Signed)
The patient states she tripped on a floor rug yesterday and she states her left foot bent. The patient states she has swelling and pain to the foot. The patient states she is not able to bare weight on the foot. The patient has a hx of stroke (left side affected).

## 2022-08-12 NOTE — Discharge Instructions (Addendum)
Please go to [facility]	[place] to have x-ray of your left foot performed.  Please wear the stabilizing ankle brace that we provided for you today at all times until further instructed.  You are welcome to keep your foot elevated and ice it is much as possible to get the swelling down.  We will contact you with the results of your x-ray as soon as we have received it and provide further recommendations based on those results.

## 2022-08-13 ENCOUNTER — Encounter: Payer: Self-pay | Admitting: Physical Medicine & Rehabilitation

## 2022-08-13 DIAGNOSIS — M25562 Pain in left knee: Secondary | ICD-10-CM | POA: Diagnosis not present

## 2022-08-13 DIAGNOSIS — I69398 Other sequelae of cerebral infarction: Secondary | ICD-10-CM

## 2022-08-18 NOTE — Addendum Note (Signed)
Addended by: Caro Hight on: 08/18/2022 01:17 PM   Modules accepted: Orders

## 2022-08-19 ENCOUNTER — Encounter: Payer: Self-pay | Admitting: Internal Medicine

## 2022-08-19 ENCOUNTER — Ambulatory Visit (INDEPENDENT_AMBULATORY_CARE_PROVIDER_SITE_OTHER): Payer: No Typology Code available for payment source | Admitting: Internal Medicine

## 2022-08-19 VITALS — BP 136/82 | HR 95 | Temp 97.9°F | Resp 16 | Ht <= 58 in | Wt 141.0 lb

## 2022-08-19 DIAGNOSIS — I10 Essential (primary) hypertension: Secondary | ICD-10-CM | POA: Diagnosis not present

## 2022-08-19 DIAGNOSIS — I69359 Hemiplegia and hemiparesis following cerebral infarction affecting unspecified side: Secondary | ICD-10-CM

## 2022-08-19 DIAGNOSIS — E785 Hyperlipidemia, unspecified: Secondary | ICD-10-CM

## 2022-08-19 DIAGNOSIS — R42 Dizziness and giddiness: Secondary | ICD-10-CM

## 2022-08-19 DIAGNOSIS — Z23 Encounter for immunization: Secondary | ICD-10-CM | POA: Diagnosis not present

## 2022-08-19 LAB — POCT GLUCOSE (DEVICE FOR HOME USE): Glucose Fasting, POC: 106 mg/dL — AB (ref 70–99)

## 2022-08-19 NOTE — Progress Notes (Signed)
Subjective:    Patient ID: Abigail Campos, female    DOB: 1971/03/25, 51 y.o.   MRN: [id number]  DOS:  08/19/2022 Type of visit - description: Follow-up  Routine follow-up. She has been feeling well. Upon arrival to the office, she developed dizziness.  No headache no diplopia.  This is the first time in a while that she has dizziness. By the time I stepped into the examining room dizziness was resolved.  Has seen neurology in reference to memory, neurology note reviewed. Memory today seems to be normal  Frequent falls: [place] once getting out of the car, another time when she tripped on old carpet at home. No LOC, no seizure activity.  Reports a lot of stress, states that family members or friends typically tell her all her problems and that made her nervous.  BP Readings from Last 3 Encounters:  08/19/22 136/82  08/12/22 129/79  08/11/22 (!) 135/92     Review of Systems See above   Past Medical History:  Diagnosis Date   Anemia    Anxiety and depression    Back pain    Essential hypertension 10/19/2007   03-2010: metoprolol changed to bystolic (was not feeling well on it, no specific allergy or reaction)     Food allergy    Hemiplegia (HCC)    Hyperlipemia    Hypertension    Joint pain    Migraine    Neuromuscular disorder ([place])    left sided hemiplegia - has brace for l leg and walks with a cane   Obesity    Ovarian cancer ([place])    Prediabetes    Sleep apnea    not wearing c-pap yet   Sleep apnea    Stroke ([place]) 2017   Vitamin D deficiency     Past Surgical History:  Procedure Laterality Date   ABDOMINAL HYSTERECTOMY  11-14-07   no oophorectomy per surgical report   CESAREAN SECTION     [id number]   INGUINAL HERNIA REPAIR     2004    Current Outpatient Medications  Medication Instructions   atorvastatin (LIPITOR) 20 MG tablet TAKE 1 TABLET BY MOUTH EVERY DAY   baclofen (LIORESAL) 30 mg, Oral, 3 times daily   buPROPion (WELLBUTRIN) 100 MG tablet  TAKE 1 TABLET BY MOUTH TWICE A DAY   carvedilol (COREG) 25 mg, Oral, 2 times daily with meals   clopidogrel (PLAVIX) 75 MG tablet TAKE 1 TABLET BY MOUTH EVERY DAY   escitalopram (LEXAPRO) 20 MG tablet TAKE 1 TABLET BY MOUTH EVERY DAY   gabapentin (NEURONTIN) 300 MG capsule TAKE 1 CAPSULE BY MOUTH THREE TIMES A DAY   losartan (COZAAR) 100 mg, Oral, Daily   traZODone (DESYREL) 100 MG tablet TAKE 1 TABLET BY MOUTH AT BEDTIME AS NEEDED FOR SLEEP.       Objective:   Physical Exam BP 136/82   Pulse 95   Temp 97.9 F (36.6 C) (Oral)   Resp 16   Ht '4\' 10"'$  (1.473 m)   Wt 141 lb (64 kg)   SpO2 95%   BMI 29.47 kg/m  General:   Well developed, NAD, BMI noted. HEENT:  Normocephalic . Face symmetric, atraumatic Lungs:  CTA B Normal respiratory effort, no intercostal retractions, no accessory muscle use. Heart: RRR,  no murmur.  Lower extremities: no pretibial edema bilaterally  Skin: Not pale. Not jaundice Neurologic:  alert & oriented X3.  Speech normal.  Neurological exam consistent with stroke sequela.  Seems at baseline.  Psych--  Cognition and judgment appear intact.  Cooperative with normal attention span and concentration.  Behavior appropriate. No anxious or depressed appearing.      Assessment     Assessment   Prediabetes: A1c 6.0 (April 2017)  HTN Hyperlipidemia Depression, insomnia GERD Stroke 03-2016: [place] (L)  spastic hemiplegia   ICH, right basilar ganglia due to HTN emergency, + encephalopathy, + dysphagia, aphasia, dysarthria Residual L spasticity.  CTA neck 03-2016 neg CTA head 10-17 (-) aneurysm Headaches , migraines dx after 2nd pregnancy, (-) CT head 2014 and 2015 Cough, persisting, saw Dr. Melvyn Novas 11-2015 OSA: Sleep study 08-2019, saw Dr. Annamaria Boots, intolerant to CPAP  COVID infection 01-2021   PLAN HTN.  Since the last visit April 2023,  meds were adjusted, follow-up BMP satisfactory.  Currently on Carvedilol 25 mg twice daily, losartan 100 mg daily.   Ambulatory BPs blood pressure today are good.  No change. Hyperlipidemia: On Lipitor, last FLP satisfactory Depression insomnia: On Lexapro and Wellbutrin.  Reports a lot of stress, apparently a family members and friends, seek advice from her regards their own problems, that made her very anxious.For now rec not to change medication rather try to distance herself from other people's problems and seek counseling. Dizziness: Brief episode of dizziness upon arrival to the office.  Symptoms quickly resolved Gait disorder: Recently Dr. Letta Pate, he referrd pt for outpatient PT. MCI: Patient saw neurology 07/23/2022 complaining of subjective memory and cognitive impairment (today memory seems wnl) . Neuro RX memory compensation strategies and Prevagen.   Preventive care: Requested a flu shot >> done. Addendum: After the patient left the examining room and went to the front desk walking, she become dizzy again. There was no involuntary movements, no bladder incontinence. We brought her back to the room on a wheelchair, did not have breakfast today but had some  fluids, we provided apple juice which she drank. By the time I arrived back into the room she felt back to normal, she was sitting in a wheelchair. Speech was clear.  Motor exam show chronic left-sided weakness. Her sister was with her, I ask if in the recent past she has noticed any different/new motor deficits and she said no. She did report that the patient has some memory issues and word finding difficulties. Vital signs repeated: Blood pressure dropped from 128/80 to 104/60 standing. At this point she is sitting down and feeling well. We talk about possibly ER for IV fluids but at then agreed on: BMP, CBC. Skip the next losartan dose. Go home drink plenty of fluids and get some food. If symptoms resurface , go to the ER.  Time spent 40 min: had a ROV and needed re-assessment and labs  d/t dizziness

## 2022-08-19 NOTE — Patient Instructions (Addendum)
Get some blood work before you leave  When you go home drink plenty of fluids and get some food.  If you continue to feel dizzy: Go to the ER  Skip losartan one  time tomorrow morning. Continue checking your blood pressures BP GOAL is between 110/65 and  135/85. If it is consistently higher or lower, let me know    [place], PLEASE SCHEDULE YOUR APPOINTMENTS Come back for a physical exam by December 2023

## 2022-08-20 LAB — BASIC METABOLIC PANEL
BUN: 18 mg/dL (ref 6–23)
CO2: 29 mEq/L (ref 19–32)
Calcium: 9.9 mg/dL (ref 8.4–10.5)
Chloride: 98 mEq/L (ref 96–112)
Creatinine, Ser: 0.98 mg/dL (ref 0.40–1.20)
GFR: 68.12 mL/min (ref >60.00)
Glucose, Bld: 76 mg/dL (ref 70–99)
Potassium: 4.2 mEq/L (ref 3.5–5.1)
Sodium: 136 mEq/L (ref 135–145)

## 2022-08-20 LAB — CBC WITH DIFFERENTIAL/PLATELET
Basophils Absolute: 0 10*3/uL (ref 0.0–0.1)
Basophils Relative: 0.2 % (ref 0.0–3.0)
Eosinophils Absolute: 0 10*3/uL (ref 0.0–0.7)
Eosinophils Relative: 0.1 % (ref 0.0–5.0)
HCT: 43.9 % (ref 36.0–46.0)
Hemoglobin: 14.3 g/dL (ref 12.0–15.0)
Lymphocytes Relative: 15.9 % (ref 12.0–46.0)
Lymphs Abs: 1.2 10*3/uL (ref 0.7–4.0)
MCHC: 32.5 g/dL (ref 30.0–36.0)
MCV: 94.8 fl (ref 78.0–100.0)
Monocytes Absolute: 1 10*3/uL (ref 0.1–1.0)
Monocytes Relative: 13 % — ABNORMAL HIGH (ref 3.0–12.0)
Neutro Abs: 5.4 10*3/uL (ref 1.4–7.7)
Neutrophils Relative %: 70.8 % (ref 43.0–77.0)
Platelets: 200 10*3/uL (ref 150.0–400.0)
RBC: 4.63 Mil/uL (ref 3.87–5.11)
RDW: 14.5 % (ref 11.5–15.5)
WBC: 7.6 10*3/uL (ref 4.0–10.5)

## 2022-08-20 NOTE — Assessment & Plan Note (Signed)
HTN.  Since the last visit April 2023,  meds were adjusted, follow-up BMP satisfactory.  Currently on Carvedilol 25 mg twice daily, losartan 100 mg daily.  Ambulatory BPs blood pressure today are good.  No change. Hyperlipidemia: On Lipitor, last FLP satisfactory Depression insomnia: On Lexapro and Wellbutrin.  Reports a lot of stress, apparently a family members and friends, seek advice from her regards their own problems, that made her very anxious.For now rec not to change medication rather try to distance herself from other people's problems and seek counseling. Dizziness: Brief episode of dizziness upon arrival to the office.  Symptoms quickly resolved Gait disorder: Recently Dr. Letta Pate, he referrd pt for outpatient PT. MCI: Patient saw neurology 07/23/2022 complaining of subjective memory and cognitive impairment (today memory seems wnl) . Neuro RX memory compensation strategies and Prevagen.   Preventive care: Requested a flu shot >> done. Addendum: After the patient left the examining room and went to the front desk walking, she become dizzy again. There was no involuntary movements, no bladder incontinence. We brought her back to the room on a wheelchair, did not have breakfast today but had some  fluids, we provided apple juice which she drank. By the time I arrived back into the room she felt back to normal, she was sitting in a wheelchair. Speech was clear.  Motor exam show chronic left-sided weakness. Her sister was with her, I ask if in the recent past she has noticed any different/new motor deficits and she said no. She did report that the patient has some memory issues and word finding difficulties. Vital signs repeated: Blood pressure dropped from 128/80 to 104/60 standing. At this point she is sitting down and feeling well. We talk about possibly ER for IV fluids but at then agreed on: BMP, CBC. Skip the next losartan dose. Go home drink plenty of fluids and get some food. If  symptoms resurface , go to the ER.

## 2022-08-31 ENCOUNTER — Ambulatory Visit
Payer: No Typology Code available for payment source | Attending: Physical Medicine & Rehabilitation | Admitting: Physical Therapy

## 2022-08-31 DIAGNOSIS — Z9181 History of falling: Secondary | ICD-10-CM | POA: Diagnosis not present

## 2022-08-31 DIAGNOSIS — R2681 Unsteadiness on feet: Secondary | ICD-10-CM | POA: Insufficient documentation

## 2022-08-31 DIAGNOSIS — R269 Unspecified abnormalities of gait and mobility: Secondary | ICD-10-CM | POA: Insufficient documentation

## 2022-08-31 DIAGNOSIS — M6281 Muscle weakness (generalized): Secondary | ICD-10-CM | POA: Insufficient documentation

## 2022-08-31 DIAGNOSIS — R2689 Other abnormalities of gait and mobility: Secondary | ICD-10-CM | POA: Diagnosis not present

## 2022-08-31 DIAGNOSIS — I69398 Other sequelae of cerebral infarction: Secondary | ICD-10-CM | POA: Diagnosis not present

## 2022-08-31 NOTE — Therapy (Signed)
OUTPATIENT PHYSICAL THERAPY NEURO EVALUATION   Patient Name: Abigail Campos MRN: [id number] DOB:1971/08/13, 51 y.o., female Today's Date: 08/31/2022   PCP: Colon Branch, MD REFERRING PROVIDER: Charlett Blake, MD    PT End of Session - 08/31/22 1147     Visit Number 1    Date for PT Re-Evaluation 10/26/22    Authorization Type [employer]    Progress Note Due on Visit 10    PT Start Time 1145    PT Stop Time 1230    PT Time Calculation (min) 45 min    Activity Tolerance Patient tolerated treatment well    Behavior During Therapy [facility] for tasks assessed/performed             Past Medical History:  Diagnosis Date   Anemia    Anxiety and depression    Back pain    Essential hypertension 10/19/2007   03-2010: metoprolol changed to bystolic (was not feeling well on it, no specific allergy or reaction)     Food allergy    Hemiplegia ([place])    Hyperlipemia    Hypertension    Joint pain    Migraine    Neuromuscular disorder ([place])    left sided hemiplegia - has brace for l leg and walks with a cane   Obesity    Ovarian cancer ([place])    Prediabetes    Sleep apnea    not wearing c-pap yet   Sleep apnea    Stroke ([place]) 2017   Vitamin D deficiency    Past Surgical History:  Procedure Laterality Date   ABDOMINAL HYSTERECTOMY  11-14-07   no oophorectomy per surgical report   CESAREAN SECTION     [id number]   INGUINAL HERNIA REPAIR     2004   Patient Active Problem List   Diagnosis Date Noted   Depression, major, single episode, severe ([place]) 07/23/2022   Vitamin D deficiency 07/06/2021   History of CVA (cerebrovascular accident) 07/06/2021   Other constipation 03/18/2021   Hot flashes 03/17/2021   Prediabetes 02/11/2021   Neuropathic pain 09/19/2020   Adjustment disorder with mixed anxiety and depressed mood 09/19/2020   Abnormality of gait 05/09/2020   Hemiplegia of nondominant side, late effect of cerebrovascular disease ([place]) 03/28/2020   Spastic  hemiplegia of left nondominant side due to nontraumatic intraparenchymal hemorrhage of brain ([place]) 12/26/2019   Trigger thumb of right hand 11/02/2019   OSA (obstructive sleep apnea) 07/26/2019   Spastic hemiplegia of left nondominant side as late effect of nontraumatic intraparenchymal hemorrhage of brain ([place]) 04/06/2019   Spastic hemiparesis affecting dominant side ([place]) 01/04/2018   Hyperglycemia 12/29/2016   Muscle spasticity    Hyperlipidemia 04/08/2016   Basal ganglia hemorrhage ([place]) 04/08/2016   Dysphagia as late effect of cerebrovascular disease    Migraine with aura and without status migrainosus, not intractable    Gait disturbance, post-stroke    Hemiplegia, post-stroke (HCC)    Aphasia, post-stroke    Dysarthria, post-stroke    ICH (intracerebral hemorrhage) ([place]) - R basal ganglia due to hypertensive emergency 04/01/2016   Upper airway cough syndrome 12/06/2015   PCP NOTES >>>>>>>>>>>>>>>>>>>>>>>>>>>.. 09/25/2015   Insomnia 12/28/2012   Annual physical exam 11/23/2011   Essential hypertension 10/19/2007    ONSET DATE: 08/18/2022   REFERRING DIAG: Y07.371,G62.6 (ICD-10-CM) - Gait disturbance, post-stroke   THERAPY DIAG:  Abnormality of gait  Muscle weakness (generalized)  History of falling  Other abnormalities of gait and mobility  Rationale for Evaluation  and Treatment Rehabilitation  SUBJECTIVE:                                                                                                                                                                                              SUBJECTIVE STATEMENT: Pt reports she went to see Dr. Letta Pate on 7/14 and had Botox in LUE and LLE, went out of town that day and had a fall. Went to [employer] later and fell again. [place] a 3rd time within that month. (mid-July to mid-August). Pt reports when she fell her L ankle rolled under her, no broken bones and no swelling after the falls but has had residual L ankle pain.  Pt also complains of her L anterior shield AFO no longer fitting well, per [employer] she still has another year before insurance will pay for a new one. Pt and family have noticed a worsening of pt's short-term memory as well as her balance over the past year.  Pt accompanied by: self  PERTINENT HISTORY: hypertension, hyperlipidemia, right basal ganglia intracerebral hemorrhage in April 2017 with residual spastic left hemiplegia, depression, sleep apnea, anemia   PAIN:  Are you having pain? Yes: NPRS scale: 4/10 Pain location: L ankle, back of L knee, L buttocks Pain description: throbbing Aggravating factors: walking, standing Relieving factors: rest, sitting    No pain at rest  PRECAUTIONS: Fall  WEIGHT BEARING RESTRICTIONS No  FALLS: Has patient fallen in last 6 months? Yes. Number of falls 3, see subjective for details  LIVING ENVIRONMENT: Lives with: lives with their family and lives with their spouse Lives in: House/apartment Stairs: No Has following equipment at home: Lobbyist, Wheelchair (manual), shower chair, bed side commode, and Grab bars (pt reports needing a new shower chair and heavier-duty grab bars)  PLOF: Independent with gait, Independent with transfers, and Requires assistive device for independence  PATIENT GOALS "just be able to walk again like my new normal after my stroke"  OBJECTIVE:   DIAGNOSTIC FINDINGS: most recent brain MRI 06/30/2021 IMPRESSION: This MRI of the brain with and without contrast shows the following: 1.   Right posterior frontal chronic hemorrhagic stroke.  This also involves the posterior internal capsule on the right and there is associated wallerian degeneration into the brainstem.  The stroke appears unchanged compared to the 08/31/2020 MRI. 2.   Couple small periventricular chronic lacunar infarctions on the left and few scattered T2/FLAIR hyperintense foci in the hemispheres consistent with chronic microvascular ischemic  change.  This also appears stable compared to the 2021 MRI. 3.   No acute findings.  Normal enhancement pattern.  No changes compared to 08/31/2020 MRI.  COGNITION: Overall cognitive status: Impaired; impaired short-term memory; mild expressive aphasia   SENSATION: Impaired in L hemibody  COORDINATION: Not assessed at eval  LOWER EXTREMITY MMT:    MMT Right Eval Left Eval  Hip flexion 5 3-  Hip extension    Hip abduction    Hip adduction    Hip internal rotation    Hip external rotation    Knee flexion 5 2+  Knee extension 5 4-  Ankle dorsiflexion 5 1  Ankle plantarflexion    Ankle inversion    Ankle eversion    (Blank rows = not tested)  BED MOBILITY:  Independent per pt report; does report increased difficulty in colder weather  TRANSFERS: Assistive device utilized: Quad cane small base  Sit to stand: Modified independence Stand to sit: Modified independence Chair to chair: Modified independence Floor:  not assessed at eval  GAIT: Gait pattern: decreased hip/knee flexion- Left, decreased ankle dorsiflexion- Left, circumduction- Left, Left hip hike, and knee flexed in stance- Left Distance walked: various clinic distances Assistive device utilized: Quad cane small base"hurricane" Level of assistance: SBA Comments: with use of L anterior shield AFO  FUNCTIONAL TESTs:    [facility] PT Assessment - 08/31/22 1216       Ambulation/Gait   Gait velocity 32.8 ft over 21.7 sec = 1.5 ft/sec   with [facility]     Standardized Balance Assessment   Standardized Balance Assessment Timed Up and Go Test;Five Times Sit to Stand    Five times sit to stand comments  23.5 sec   no UE support     Timed Up and Go Test   TUG Normal TUG    Normal TUG (seconds) 23.6   with SBQC            TODAY'S TREATMENT:  PT Evaluation  PATIENT EDUCATION: Education details: Eval findings, POC Person educated: Patient Education method: Customer service manager Education comprehension:  verbalized understanding and needs further education   HOME EXERCISE PROGRAM: To be established next session   GOALS: Goals reviewed with patient? Yes  SHORT TERM GOALS: Target date: 09/21/2022  Pt will be independent with initial HEP for improved strength, balance, transfers and gait. Baseline: Goal status: INITIAL  2.  Pt will improve gait velocity to at least 2.0 ft/sec for improved gait efficiency and performance at mod I level   Baseline: 1.5 ft/sec with SBQC (9/18) Goal status: INITIAL  3.  Pt will improve normal TUG to less than or equal to 20 seconds for improved functional mobility and decreased fall risk.  Baseline: 23.6 sec with SBQC (9/18) Goal status: INITIAL  4.  Berg to be assessed and STG written Baseline:  Goal status: INITIAL   LONG TERM GOALS: Target date: 10/26/2022  Pt will be independent with final HEP for improved strength, balance, transfers and gait. Baseline:  Goal status: INITIAL  2.  Pt will improve gait velocity to at least 2.5 ft/sec for improved gait efficiency and performance at mod I level   Baseline: 1.5 ft/sec with SBQC (9/18) Goal status: INITIAL  3.  Pt will improve normal TUG to less than or equal to 17 seconds for improved functional mobility and decreased fall risk.  Baseline: 23.6 sec with SBQC (9/18) Goal status: INITIAL  4.  Berg to be assessed and STG written Baseline:  Goal status: INITIAL  5.  Pt will improve 5 x STS to less than or equal to 18 seconds to demonstrate  improved functional strength and transfer efficiency.  Baseline: 23.5 sec (9/18) Goal status: INITIAL  6.  Pt will verbalize fall prevention strategies and fall recovery strategies Baseline:  Goal status: INITIAL  ASSESSMENT:  CLINICAL IMPRESSION: Patient is a 51 year old female referred to Neuro OPPT for post-stroke gait disturbances.  Pt's PMH is significant for: hypertension, hyperlipidemia, right basal ganglia intracerebral hemorrhage in April  2017 with residual spastic left hemiplegia, depression, sleep apnea, anemia  The following deficits were present during the exam: decreased LLE strength, decreased gait speed, and increased fall risk. Based on her gait speed of 1.5 ft/sec, her TUG score of 23.6 sec, and her frequent fall history, pt is an increased risk for falls. Pt would benefit from skilled PT to address these impairments and functional limitations to maximize functional mobility independence.   OBJECTIVE IMPAIRMENTS Abnormal gait, decreased balance, decreased cognition, decreased coordination, decreased endurance, decreased mobility, difficulty walking, decreased strength, impaired perceived functional ability, increased muscle spasms, impaired sensation, impaired tone, impaired UE functional use, and pain.   ACTIVITY LIMITATIONS carrying, lifting, bending, squatting, stairs, transfers, bed mobility, bathing, dressing, and locomotion level  PARTICIPATION LIMITATIONS: cleaning, driving, shopping, and community activity  PERSONAL FACTORS Time since onset of injury/illness/exacerbation and 3+ comorbidities:    past medical history of hypertension, hyperlipidemia, right basal ganglia intracerebral hemorrhage in April 2017 with residual spastic left hemiplegia, depression, sleep apnea, anemia are also affecting patient's functional outcome.   REHAB POTENTIAL: Fair time since onset  CLINICAL DECISION MAKING: Stable/uncomplicated  EVALUATION COMPLEXITY: Moderate  PLAN: PT FREQUENCY: 2x/week  PT DURATION: 8 weeks (12 visits total)  PLANNED INTERVENTIONS: Therapeutic exercises, Therapeutic activity, Neuromuscular re-education, Balance training, Gait training, Patient/Family education, Self Care, Joint mobilization, Stair training, Vestibular training, Canalith repositioning, Visual/preceptual remediation/compensation, Orthotic/Fit training, DME instructions, Aquatic Therapy, Dry Needling, Cognitive remediation, Electrical  stimulation, Wheelchair mobility training, Cryotherapy, Moist heat, Splintting, Taping, Manual therapy, and Re-evaluation  PLAN FOR NEXT SESSION: initiate HEP for LLE strength/coordination/balance, assess Berg and set STG/LTG   Excell Seltzer, PT, DPT, CSRS 08/31/2022, 12:36 PM

## 2022-09-02 ENCOUNTER — Ambulatory Visit: Payer: No Typology Code available for payment source | Admitting: Physical Therapy

## 2022-09-02 DIAGNOSIS — R269 Unspecified abnormalities of gait and mobility: Secondary | ICD-10-CM

## 2022-09-02 DIAGNOSIS — R2689 Other abnormalities of gait and mobility: Secondary | ICD-10-CM

## 2022-09-02 DIAGNOSIS — R2681 Unsteadiness on feet: Secondary | ICD-10-CM | POA: Diagnosis not present

## 2022-09-02 DIAGNOSIS — Z9181 History of falling: Secondary | ICD-10-CM | POA: Diagnosis not present

## 2022-09-02 DIAGNOSIS — M6281 Muscle weakness (generalized): Secondary | ICD-10-CM | POA: Diagnosis not present

## 2022-09-02 DIAGNOSIS — I69398 Other sequelae of cerebral infarction: Secondary | ICD-10-CM | POA: Diagnosis not present

## 2022-09-02 NOTE — Therapy (Signed)
OUTPATIENT PHYSICAL THERAPY NEURO TREATMENT   Patient Name: Abigail Campos MRN: [id number] DOB:11-May-1971, 51 y.o., female Today's Date: 09/02/2022   PCP: Colon Branch, MD REFERRING PROVIDER: Charlett Blake, MD    PT End of Session - 09/02/22 1147     Visit Number 2    Number of Visits 42   with eval   Date for PT Re-Evaluation 10/26/22    Authorization Type [employer] Medicare    Progress Note Due on Visit 10    PT Start Time 1143    PT Stop Time 1230    PT Time Calculation (min) 47 min    Equipment Utilized During Treatment Gait belt    Activity Tolerance Patient tolerated treatment well    Behavior During Therapy WFL for tasks assessed/performed              Past Medical History:  Diagnosis Date   Anemia    Anxiety and depression    Back pain    Essential hypertension 10/19/2007   03-2010: metoprolol changed to bystolic (was not feeling well on it, no specific allergy or reaction)     Food allergy    Hemiplegia ([place])    Hyperlipemia    Hypertension    Joint pain    Migraine    Neuromuscular disorder ([place])    left sided hemiplegia - has brace for l leg and walks with a cane   Obesity    Ovarian cancer ([place])    Prediabetes    Sleep apnea    not wearing c-pap yet   Sleep apnea    Stroke ([place]) 2017   Vitamin D deficiency    Past Surgical History:  Procedure Laterality Date   ABDOMINAL HYSTERECTOMY  11-14-07   no oophorectomy per surgical report   CESAREAN SECTION     [id number]   INGUINAL HERNIA REPAIR     2004   Patient Active Problem List   Diagnosis Date Noted   Depression, major, single episode, severe ([place]) 07/23/2022   Vitamin D deficiency 07/06/2021   History of CVA (cerebrovascular accident) 07/06/2021   Other constipation 03/18/2021   Hot flashes 03/17/2021   Prediabetes 02/11/2021   Neuropathic pain 09/19/2020   Adjustment disorder with mixed anxiety and depressed mood 09/19/2020   Abnormality of gait 05/09/2020   Hemiplegia of  nondominant side, late effect of cerebrovascular disease ([place]) 03/28/2020   Spastic hemiplegia of left nondominant side due to nontraumatic intraparenchymal hemorrhage of brain ([place]) 12/26/2019   Trigger thumb of right hand 11/02/2019   OSA (obstructive sleep apnea) 07/26/2019   Spastic hemiplegia of left nondominant side as late effect of nontraumatic intraparenchymal hemorrhage of brain ([place]) 04/06/2019   Spastic hemiparesis affecting dominant side ([place]) 01/04/2018   Hyperglycemia 12/29/2016   Muscle spasticity    Hyperlipidemia 04/08/2016   Basal ganglia hemorrhage ([place]) 04/08/2016   Dysphagia as late effect of cerebrovascular disease    Migraine with aura and without status migrainosus, not intractable    Gait disturbance, post-stroke    Hemiplegia, post-stroke (HCC)    Aphasia, post-stroke    Dysarthria, post-stroke    ICH (intracerebral hemorrhage) ([place]) - R basal ganglia due to hypertensive emergency 04/01/2016   Upper airway cough syndrome 12/06/2015   PCP NOTES >>>>>>>>>>>>>>>>>>>>>>>>>>>.. 09/25/2015   Insomnia 12/28/2012   Annual physical exam 11/23/2011   Essential hypertension 10/19/2007    ONSET DATE: 08/18/2022   REFERRING DIAG: G62.694,W54.6 (ICD-10-CM) - Gait disturbance, post-stroke   THERAPY DIAG:  Abnormality of  gait  Muscle weakness (generalized)  History of falling  Other abnormalities of gait and mobility  Rationale for Evaluation and Treatment Rehabilitation  SUBJECTIVE:                                                                                                                                                                                              SUBJECTIVE STATEMENT: Pt reports feeling "stiff" morning, reports ongoing pain in L foot/ankle that is a burning/throbbing nerve pain. Pt with questions about neuropathy and asking if that is what she is experiencing.  Pt accompanied by: self  PERTINENT HISTORY: hypertension, hyperlipidemia,  right basal ganglia intracerebral hemorrhage in April 2017 with residual spastic left hemiplegia, depression, sleep apnea, anemia   PAIN:  Are you having pain? Yes: NPRS scale: 4/10 Pain location: L ankle, back of L knee, L buttocks Pain description: throbbing Aggravating factors: walking, standing Relieving factors: rest, sitting    No pain at rest  PRECAUTIONS: Fall  FALLS: Has patient fallen in last 6 months? Yes. Number of falls 3, see subjective eval for details  PATIENT GOALS "just be able to walk again like my new normal after my stroke"  OBJECTIVE:   TODAY'S TREATMENT:   THER ACT:   [facility] PT Assessment - 09/02/22 1150       Standardized Balance Assessment   Standardized Balance Assessment Berg Balance Test      Berg Balance Test   Sit to Stand Able to stand  independently using hands    Standing Unsupported Able to stand 2 minutes with supervision    Sitting with Back Unsupported but Feet Supported on Floor or Stool Able to sit safely and securely 2 minutes    Stand to Sit Controls descent by using hands    Transfers Able to transfer safely, definite need of hands    Standing Unsupported with Eyes Closed Needs help to keep from falling    Standing Unsupported with Feet Together Able to place feet together independently and stand for 1 minute with supervision    From Standing, Reach Forward with Outstretched Arm Reaches forward but needs supervision    From Standing Position, Pick up Object from Floor Able to pick up shoe, needs supervision    From Standing Position, Turn to Look Behind Over each Shoulder Turn sideways only but maintains balance    Turn 360 Degrees Needs close supervision or verbal cueing    Standing Unsupported, Alternately Place Feet on Step/Stool Needs assistance to keep from falling or unable to try    Standing Unsupported, One Foot in Front Able to take small step independently and  hold 30 seconds    Standing on One Leg Tries to lift leg/unable  to hold 3 seconds but remains standing independently    Total Score 29    Berg comment: 29/56            THER EX: Initiated HEP, see bolded below.  Pt able to perform supine bridge with palpable L glute activation. Pt unable to safely perform single-leg bridge with LLE due to weakness and muscle compensations noted.  Pt unable to perform sidelying clams, able to perform hook-lying SKFO with LLE x 5 reps before onset of fatigue.  NMR: Pt noted to exhibit increased weight shift to the R and decreased WB on LLE during sit to stand transfers and gait. Pt able to perform sit to stands x 10 reps from EOM with use of mirror and min multimodal cueing to maintain midline. Added to HEP.  Sit to stand with 2" step under RLE with cues to keep LLE on the ground, min A for balance with frequent LOB posteriorly and to the R. Pt with increased fear of falling with this activity.  PATIENT EDUCATION: Education details: initiated HEP, nerve pain post-stroke (can benefit from further education about peripheral neuropathy vs neuropathic pain post-stroke) Person educated: Patient Education method: Explanation, Demonstration, Tactile cues, Verbal cues, and Handouts Education comprehension: verbalized understanding and needs further education   HOME EXERCISE PROGRAM: Access Code: [email or web address] URL: [email or web address] Date: 09/02/2022 Prepared by: Excell Seltzer  Exercises - Sit to Stand with Armchair  - 1 x daily - 7 x weekly - 3 sets - 10 reps - Supine Bridge  - 1 x daily - 7 x weekly - 3 sets - 10 reps - Supine Single Bent Knee Fallout  - 1 x daily - 7 x weekly - 3 sets - 5 reps   GOALS: Goals reviewed with patient? Yes  SHORT TERM GOALS: Target date: 09/21/2022  Pt will be independent with initial HEP for improved strength, balance, transfers and gait. Baseline: Goal status: INITIAL  2.  Pt will improve gait velocity to at least 2.0 ft/sec for improved gait efficiency and  performance at mod I level   Baseline: 1.5 ft/sec with SBQC (9/18) Goal status: INITIAL  3.  Pt will improve normal TUG to less than or equal to 20 seconds for improved functional mobility and decreased fall risk.  Baseline: 23.6 sec with SBQC (9/18) Goal status: INITIAL  4.  Pt will improve Berg score to 33/56 for decreased fall risk  Baseline: 29/56 (9/20) Goal status: INITIAL   LONG TERM GOALS: Target date: 10/26/2022  Pt will be independent with final HEP for improved strength, balance, transfers and gait. Baseline:  Goal status: INITIAL  2.  Pt will improve gait velocity to at least 2.5 ft/sec for improved gait efficiency and performance at mod I level   Baseline: 1.5 ft/sec with SBQC (9/18) Goal status: INITIAL  3.  Pt will improve normal TUG to less than or equal to 17 seconds for improved functional mobility and decreased fall risk.  Baseline: 23.6 sec with SBQC (9/18) Goal status: INITIAL  4.  Pt will improve Berg score to 37/56 for decreased fall risk  Baseline: 29/56 (9/20) Goal status: INITIAL  5.  Pt will improve 5 x STS to less than or equal to 18 seconds to demonstrate improved functional strength and transfer efficiency.  Baseline: 23.5 sec (9/18) Goal status: INITIAL  6.  Pt will verbalize fall prevention strategies and fall recovery strategies Baseline:  Goal status: INITIAL  ASSESSMENT:  CLINICAL IMPRESSION: Emphasis of skilled PT session on assessing BBS and writing STG and LTG as well as initiating HEP for LLE strengthening and NMR with patient. Patient demonstrates increased fall risk as noted by score of   29/56 on Berg Balance Scale.  (<36= high risk for falls, close to 100%; 37-45 significant >80%; 46-51 moderate >50%; 52-55 lower >25%). Reviewed score and functional implications. Pt exhibits decreased weight shift to the L and decreased WB through LLE during transfers and gait, initiated HEP for strengthening and NMR of LLE, handout provided.  Pt continues to benefit from skilled therapy services to address balance, gait, and safety impairments. Continue POC.   OBJECTIVE IMPAIRMENTS Abnormal gait, decreased balance, decreased cognition, decreased coordination, decreased endurance, decreased mobility, difficulty walking, decreased strength, impaired perceived functional ability, increased muscle spasms, impaired sensation, impaired tone, impaired UE functional use, and pain.   ACTIVITY LIMITATIONS carrying, lifting, bending, squatting, stairs, transfers, bed mobility, bathing, dressing, and locomotion level  PARTICIPATION LIMITATIONS: cleaning, driving, shopping, and community activity  PERSONAL FACTORS Time since onset of injury/illness/exacerbation and 3+ comorbidities:    past medical history of hypertension, hyperlipidemia, right basal ganglia intracerebral hemorrhage in April 2017 with residual spastic left hemiplegia, depression, sleep apnea, anemia are also affecting patient's functional outcome.   REHAB POTENTIAL: Fair time since onset  CLINICAL DECISION MAKING: Stable/uncomplicated  EVALUATION COMPLEXITY: Moderate  PLAN: PT FREQUENCY: 2x/week  PT DURATION: 8 weeks (12 visits total)  PLANNED INTERVENTIONS: Therapeutic exercises, Therapeutic activity, Neuromuscular re-education, Balance training, Gait training, Patient/Family education, Self Care, Joint mobilization, Stair training, Vestibular training, Canalith repositioning, Visual/preceptual remediation/compensation, Orthotic/Fit training, DME instructions, Aquatic Therapy, Dry Needling, Cognitive remediation, Electrical stimulation, Wheelchair mobility training, Cryotherapy, Moist heat, Splintting, Taping, Manual therapy, and Re-evaluation  PLAN FOR NEXT SESSION: add to HEP for LLE strength/coordination/balance, work on increasing weight shift to the L/maintaining midline with standing, LLE NMR, staggered sit to stands, any LLE strengthening but especially hip  adductors   Excell Seltzer, PT, DPT, CSRS 09/02/2022, 12:50 PM

## 2022-09-07 DIAGNOSIS — M21372 Foot drop, left foot: Secondary | ICD-10-CM | POA: Diagnosis not present

## 2022-09-07 DIAGNOSIS — Z8673 Personal history of transient ischemic attack (TIA), and cerebral infarction without residual deficits: Secondary | ICD-10-CM | POA: Diagnosis not present

## 2022-09-07 DIAGNOSIS — Z6827 Body mass index (BMI) 27.0-27.9, adult: Secondary | ICD-10-CM | POA: Diagnosis not present

## 2022-09-07 DIAGNOSIS — E8881 Metabolic syndrome: Secondary | ICD-10-CM | POA: Diagnosis not present

## 2022-09-07 DIAGNOSIS — Z8639 Personal history of other endocrine, nutritional and metabolic disease: Secondary | ICD-10-CM | POA: Diagnosis not present

## 2022-09-09 ENCOUNTER — Ambulatory Visit: Payer: No Typology Code available for payment source | Admitting: Physical Therapy

## 2022-09-09 DIAGNOSIS — R2689 Other abnormalities of gait and mobility: Secondary | ICD-10-CM

## 2022-09-09 DIAGNOSIS — R269 Unspecified abnormalities of gait and mobility: Secondary | ICD-10-CM | POA: Diagnosis not present

## 2022-09-09 DIAGNOSIS — M6281 Muscle weakness (generalized): Secondary | ICD-10-CM | POA: Diagnosis not present

## 2022-09-09 DIAGNOSIS — Z9181 History of falling: Secondary | ICD-10-CM | POA: Diagnosis not present

## 2022-09-09 DIAGNOSIS — R2681 Unsteadiness on feet: Secondary | ICD-10-CM | POA: Diagnosis not present

## 2022-09-09 DIAGNOSIS — I69398 Other sequelae of cerebral infarction: Secondary | ICD-10-CM | POA: Diagnosis not present

## 2022-09-09 NOTE — Therapy (Signed)
OUTPATIENT PHYSICAL THERAPY NEURO TREATMENT   Patient Name: Abigail Campos MRN: [id number] DOB:May 29, 1971, 51 y.o., female Today's Date: 09/09/2022   PCP: Colon Branch, MD REFERRING PROVIDER: Charlett Blake, MD    PT End of Session - 09/09/22 1105     Visit Number 3    Number of Visits 13   with eval   Date for PT Re-Evaluation 10/26/22    Authorization Type [employer]    Progress Note Due on Visit 10    PT Start Time 1105    PT Stop Time 1148    PT Time Calculation (min) 43 min    Equipment Utilized During Treatment Gait belt    Activity Tolerance Patient tolerated treatment well    Behavior During Therapy WFL for tasks assessed/performed               Past Medical History:  Diagnosis Date   Anemia    Anxiety and depression    Back pain    Essential hypertension 10/19/2007   03-2010: metoprolol changed to bystolic (was not feeling well on it, no specific allergy or reaction)     Food allergy    Hemiplegia ([place])    Hyperlipemia    Hypertension    Joint pain    Migraine    Neuromuscular disorder ([place])    left sided hemiplegia - has brace for l leg and walks with a cane   Obesity    Ovarian cancer ([place])    Prediabetes    Sleep apnea    not wearing c-pap yet   Sleep apnea    Stroke ([place]) 2017   Vitamin D deficiency    Past Surgical History:  Procedure Laterality Date   ABDOMINAL HYSTERECTOMY  11-14-07   no oophorectomy per surgical report   CESAREAN SECTION     [id number]   INGUINAL HERNIA REPAIR     2004   Patient Active Problem List   Diagnosis Date Noted   Depression, major, single episode, severe ([place]) 07/23/2022   Vitamin D deficiency 07/06/2021   History of CVA (cerebrovascular accident) 07/06/2021   Other constipation 03/18/2021   Hot flashes 03/17/2021   Prediabetes 02/11/2021   Neuropathic pain 09/19/2020   Adjustment disorder with mixed anxiety and depressed mood 09/19/2020   Abnormality of gait 05/09/2020   Hemiplegia of  nondominant side, late effect of cerebrovascular disease ([place]) 03/28/2020   Spastic hemiplegia of left nondominant side due to nontraumatic intraparenchymal hemorrhage of brain ([place]) 12/26/2019   Trigger thumb of right hand 11/02/2019   OSA (obstructive sleep apnea) 07/26/2019   Spastic hemiplegia of left nondominant side as late effect of nontraumatic intraparenchymal hemorrhage of brain ([place]) 04/06/2019   Spastic hemiparesis affecting dominant side ([place]) 01/04/2018   Hyperglycemia 12/29/2016   Muscle spasticity    Hyperlipidemia 04/08/2016   Basal ganglia hemorrhage ([place]) 04/08/2016   Dysphagia as late effect of cerebrovascular disease    Migraine with aura and without status migrainosus, not intractable    Gait disturbance, post-stroke    Hemiplegia, post-stroke (HCC)    Aphasia, post-stroke    Dysarthria, post-stroke    ICH (intracerebral hemorrhage) ([place]) - R basal ganglia due to hypertensive emergency 04/01/2016   Upper airway cough syndrome 12/06/2015   PCP NOTES >>>>>>>>>>>>>>>>>>>>>>>>>>>.. 09/25/2015   Insomnia 12/28/2012   Annual physical exam 11/23/2011   Essential hypertension 10/19/2007    ONSET DATE: 08/18/2022   REFERRING DIAG: V67.209,O70.9 (ICD-10-CM) - Gait disturbance, post-stroke   THERAPY DIAG:  Abnormality  of gait  Muscle weakness (generalized)  Other abnormalities of gait and mobility  History of falling  Rationale for Evaluation and Treatment Rehabilitation  SUBJECTIVE:                                                                                                                                                                                              SUBJECTIVE STATEMENT: Pt reports she has been doing well since last session. No pain today. No falls or near falls since last session.  Pt accompanied by: self  PERTINENT HISTORY: hypertension, hyperlipidemia, right basal ganglia intracerebral hemorrhage in April 2017 with residual spastic left  hemiplegia, depression, sleep apnea, anemia   PAIN:  Are you having pain? No   No pain at rest  PRECAUTIONS: Fall  FALLS: Has patient fallen in last 6 months? Yes. Number of falls 3, see subjective eval for details  PATIENT GOALS "just be able to walk again like my new normal after my stroke"  OBJECTIVE:   TODAY'S TREATMENT:   THER ACT: In // bars: -sidesteps L/R with difficulty maintaining L hip abduction without ER compensation  Education with patient regarding peripheral neuropathy vs neuropathic pain post-stroke due to questions regarding numbness and sensations in LLE.  THER EX: -standing hip abd 2 x 5 reps with focus on decreasing compensations -standing hip extension 2 x 5 reps with cues for correct posture -attempted to added in yellow theraband but too difficult for patient to perform without increase in compensations -added to HEP, see bolded below  NMR: In // bars: -4" step taps with LLE with focus on maintaining neutral hip, 2 x 10 reps to fatigue -foam beam step-overs with LLE heel strike on target, difficulty with circumduction compensation -standing RLE dot taps 2 x 10 reps with manual assist for control of L knee   PATIENT EDUCATION: Education details: continue HEP and added to HEP, nerve pain post-stroke Person educated: Patient Education method: Explanation, Demonstration, Tactile cues, Verbal cues, and Handouts Education comprehension: verbalized understanding and needs further education   HOME EXERCISE PROGRAM: Access Code: [email or web address] URL: [email or web address] Date: 09/02/2022 Prepared by: Excell Seltzer  Exercises - Sit to Stand with Armchair  - 1 x daily - 7 x weekly - 3 sets - 10 reps - Supine Bridge  - 1 x daily - 7 x weekly - 3 sets - 10 reps - Supine Single Bent Knee Fallout  - 1 x daily - 7 x weekly - 3 sets - 5 reps - Standing Hip Abduction with Counter Support  - 1 x daily - 7 x weekly - 3 sets - 5 reps -  Standing Hip  Extension with Counter Support  - 1 x daily - 7 x weekly - 3 sets - 5 reps   GOALS: Goals reviewed with patient? Yes  SHORT TERM GOALS: Target date: 09/21/2022  Pt will be independent with initial HEP for improved strength, balance, transfers and gait. Baseline: Goal status: INITIAL  2.  Pt will improve gait velocity to at least 2.0 ft/sec for improved gait efficiency and performance at mod I level   Baseline: 1.5 ft/sec with SBQC (9/18) Goal status: INITIAL  3.  Pt will improve normal TUG to less than or equal to 20 seconds for improved functional mobility and decreased fall risk.  Baseline: 23.6 sec with SBQC (9/18) Goal status: INITIAL  4.  Pt will improve Berg score to 33/56 for decreased fall risk  Baseline: 29/56 (9/20) Goal status: INITIAL   LONG TERM GOALS: Target date: 10/26/2022  Pt will be independent with final HEP for improved strength, balance, transfers and gait. Baseline:  Goal status: INITIAL  2.  Pt will improve gait velocity to at least 2.5 ft/sec for improved gait efficiency and performance at mod I level   Baseline: 1.5 ft/sec with SBQC (9/18) Goal status: INITIAL  3.  Pt will improve normal TUG to less than or equal to 17 seconds for improved functional mobility and decreased fall risk.  Baseline: 23.6 sec with SBQC (9/18) Goal status: INITIAL  4.  Pt will improve Berg score to 37/56 for decreased fall risk  Baseline: 29/56 (9/20) Goal status: INITIAL  5.  Pt will improve 5 x STS to less than or equal to 18 seconds to demonstrate improved functional strength and transfer efficiency.  Baseline: 23.5 sec (9/18) Goal status: INITIAL  6.  Pt will verbalize fall prevention strategies and fall recovery strategies Baseline:  Goal status: INITIAL  ASSESSMENT:  CLINICAL IMPRESSION: Emphasis of skilled PT session on continuing to work on LLE NMR due to decreased strength and control of limb. Pt continues to exhibit significant hip abd weakness  and ongoing gait deviations including circumduction of LLE and decreased stance time on LLE. Pt continues to benefit from skilled therapy services to address L hemi-body impairments and to decrease her fall risk. Continue POC.   OBJECTIVE IMPAIRMENTS Abnormal gait, decreased balance, decreased cognition, decreased coordination, decreased endurance, decreased mobility, difficulty walking, decreased strength, impaired perceived functional ability, increased muscle spasms, impaired sensation, impaired tone, impaired UE functional use, and pain.   ACTIVITY LIMITATIONS carrying, lifting, bending, squatting, stairs, transfers, bed mobility, bathing, dressing, and locomotion level  PARTICIPATION LIMITATIONS: cleaning, driving, shopping, and community activity  PERSONAL FACTORS Time since onset of injury/illness/exacerbation and 3+ comorbidities:    past medical history of hypertension, hyperlipidemia, right basal ganglia intracerebral hemorrhage in April 2017 with residual spastic left hemiplegia, depression, sleep apnea, anemia are also affecting patient's functional outcome.   REHAB POTENTIAL: Fair time since onset  CLINICAL DECISION MAKING: Stable/uncomplicated  EVALUATION COMPLEXITY: Moderate  PLAN: PT FREQUENCY: 2x/week  PT DURATION: 8 weeks (12 visits total)  PLANNED INTERVENTIONS: Therapeutic exercises, Therapeutic activity, Neuromuscular re-education, Balance training, Gait training, Patient/Family education, Self Care, Joint mobilization, Stair training, Vestibular training, Canalith repositioning, Visual/preceptual remediation/compensation, Orthotic/Fit training, DME instructions, Aquatic Therapy, Dry Needling, Cognitive remediation, Electrical stimulation, Wheelchair mobility training, Cryotherapy, Moist heat, Splintting, Taping, Manual therapy, and Re-evaluation  PLAN FOR NEXT SESSION: add to HEP for LLE strength/coordination/balance, work on increasing weight shift to the L/maintaining  midline with standing, LLE NMR, staggered sit to stands, any LLE  strengthening but especially hip abductors, trial AFOs   Excell Seltzer, PT, DPT, CSRS 09/09/2022, 11:49 AM

## 2022-09-10 DIAGNOSIS — M25562 Pain in left knee: Secondary | ICD-10-CM | POA: Diagnosis not present

## 2022-09-11 ENCOUNTER — Ambulatory Visit: Payer: No Typology Code available for payment source | Admitting: Physical Therapy

## 2022-09-11 DIAGNOSIS — R269 Unspecified abnormalities of gait and mobility: Secondary | ICD-10-CM | POA: Diagnosis not present

## 2022-09-11 DIAGNOSIS — M6281 Muscle weakness (generalized): Secondary | ICD-10-CM | POA: Diagnosis not present

## 2022-09-11 DIAGNOSIS — Z9181 History of falling: Secondary | ICD-10-CM

## 2022-09-11 DIAGNOSIS — R2689 Other abnormalities of gait and mobility: Secondary | ICD-10-CM | POA: Diagnosis not present

## 2022-09-11 DIAGNOSIS — R2681 Unsteadiness on feet: Secondary | ICD-10-CM | POA: Diagnosis not present

## 2022-09-11 DIAGNOSIS — I69398 Other sequelae of cerebral infarction: Secondary | ICD-10-CM | POA: Diagnosis not present

## 2022-09-11 NOTE — Therapy (Signed)
OUTPATIENT PHYSICAL THERAPY NEURO TREATMENT   Patient Name: Abigail Campos MRN: [id number] DOB:04-09-1971, 51 y.o., female Today's Date: 09/11/2022   PCP: Colon Branch, MD REFERRING PROVIDER: Charlett Blake, MD    PT End of Session - 09/11/22 1236     Visit Number 4    Number of Visits 13   with eval   Date for PT Re-Evaluation 10/26/22    Authorization Type [employer] Medicare    Progress Note Due on Visit 10    PT Start Time 1233    PT Stop Time 1312    PT Time Calculation (min) 39 min    Equipment Utilized During Treatment Gait belt;Other (comment)   L [place] AFO   Activity Tolerance Patient tolerated treatment well    Behavior During Therapy WFL for tasks assessed/performed               Past Medical History:  Diagnosis Date   Anemia    Anxiety and depression    Back pain    Essential hypertension 10/19/2007   03-2010: metoprolol changed to bystolic (was not feeling well on it, no specific allergy or reaction)     Food allergy    Hemiplegia ([facility])    Hyperlipemia    Hypertension    Joint pain    Migraine    Neuromuscular disorder ([place])    left sided hemiplegia - has brace for l leg and walks with a cane   Obesity    Ovarian cancer ([place])    Prediabetes    Sleep apnea    not wearing c-pap yet   Sleep apnea    Stroke ([place]) 2017   Vitamin D deficiency    Past Surgical History:  Procedure Laterality Date   ABDOMINAL HYSTERECTOMY  11-14-07   no oophorectomy per surgical report   CESAREAN SECTION     [id number]   INGUINAL HERNIA REPAIR     2004   Patient Active Problem List   Diagnosis Date Noted   Depression, major, single episode, severe ([place]) 07/23/2022   Vitamin D deficiency 07/06/2021   History of CVA (cerebrovascular accident) 07/06/2021   Other constipation 03/18/2021   Hot flashes 03/17/2021   Prediabetes 02/11/2021   Neuropathic pain 09/19/2020   Adjustment disorder with mixed anxiety and depressed mood 09/19/2020   Abnormality of  gait 05/09/2020   Hemiplegia of nondominant side, late effect of cerebrovascular disease ([place]) 03/28/2020   Spastic hemiplegia of left nondominant side due to nontraumatic intraparenchymal hemorrhage of brain ([place]) 12/26/2019   Trigger thumb of right hand 11/02/2019   OSA (obstructive sleep apnea) 07/26/2019   Spastic hemiplegia of left nondominant side as late effect of nontraumatic intraparenchymal hemorrhage of brain ([place]) 04/06/2019   Spastic hemiparesis affecting dominant side ([place]) 01/04/2018   Hyperglycemia 12/29/2016   Muscle spasticity    Hyperlipidemia 04/08/2016   Basal ganglia hemorrhage ([place]) 04/08/2016   Dysphagia as late effect of cerebrovascular disease    Migraine with aura and without status migrainosus, not intractable    Gait disturbance, post-stroke    Hemiplegia, post-stroke ([facility])    Aphasia, post-stroke    Dysarthria, post-stroke    ICH (intracerebral hemorrhage) ([place]) - R basal ganglia due to hypertensive emergency 04/01/2016   Upper airway cough syndrome 12/06/2015   PCP NOTES >>>>>>>>>>>>>>>>>>>>>>>>>>>.. 09/25/2015   Insomnia 12/28/2012   Annual physical exam 11/23/2011   Essential hypertension 10/19/2007    ONSET DATE: 08/18/2022   REFERRING DIAG: Q73.419,F79.0 (ICD-10-CM) - Gait disturbance, post-stroke  THERAPY DIAG:  Unsteadiness on feet  Other abnormalities of gait and mobility  History of falling  Rationale for Evaluation and Treatment Rehabilitation  SUBJECTIVE:                                                                                                                                                                                              SUBJECTIVE STATEMENT: Pt denies any new falls or changes.   Pt accompanied by: self  PERTINENT HISTORY: hypertension, hyperlipidemia, right basal ganglia intracerebral hemorrhage in April 2017 with residual spastic left hemiplegia, depression, sleep apnea, anemia   PAIN:  Are you having  pain? No   No pain at rest  PRECAUTIONS: Fall  FALLS: Has patient fallen in last 6 months? Yes. Number of falls 3, see subjective eval for details  PATIENT GOALS "just be able to walk again like my new normal after my stroke"  OBJECTIVE:   TODAY'S TREATMENT:  Self-care  Discussed timeline of pt obtaining her current AFO. Per chart review, pt received her current orthotic in October of 2019, meaning she is unable to receive a new one until 2024. Pt very discouraged by this, as her current brace no longer fits as she has lost weight and it is too short on her tibia, poorly controlling her knee in stance phase. Due to this, pt has had several falls. Encouraged pt to contact insurance company to request a new brace due to recurrent falls in current brace. Pt verbalized understanding.   Gait training  Gait pattern: step through pattern, decreased step length- Right, decreased stride length, decreased hip/knee flexion- Left, decreased ankle dorsiflexion- Left, circumduction- Left, Left hip hike, lateral lean- Right, and decreased trunk rotation Distance walked: 115' Assistive device utilized: Lobbyist Level of assistance: SBA Comments: Pt trialed [place] SpryStep Plus and noted decreased ER of LLE w/good L knee stability, no buckling noted. Pt reported feeling very secure in AFO and felt as though her knee was more supported than it is in her current brace.   Gait pattern: step through pattern, decreased step length- Right, decreased stride length, decreased hip/knee flexion- Left, decreased ankle dorsiflexion- Left, circumduction- Left, Left hip hike, lateral lean- Right, decreased trunk rotation, and wide BOS Distance walked: 115' Assistive device utilized: Lobbyist Level of assistance: SBA Comments: Trialed [place] WalkOn Reaction and noted immediate exaggeration of ER of LLE w/brace due to medial strut. Pt also demonstrated increased instances of L knee buckling,  likely due to brace not being tall enough for pt.    Gait pattern:  ER of LLE, step through pattern,  decreased step length- Right, decreased hip/knee flexion- Left, decreased ankle dorsiflexion- Left, circumduction- Left, knee flexed in stance- Left, lateral lean- Right, decreased trunk rotation, and poor foot clearance- Left Distance walked: Various clinic distances  Assistive device utilized: Quad cane small base Level of assistance: SBA Comments: Noted increased instability w/pt's current AFO (L knee buckling, ER of LLE, decreased step clearance)     PATIENT EDUCATION: Education details: continue HEP, brief education on different AFO types, contacting insurance to request new AFO prior to 5 years due to recurrent falls Person educated: Patient Education method: Explanation, Demonstration, Tactile cues, Verbal cues, and Handouts Education comprehension: verbalized understanding and needs further education   HOME EXERCISE PROGRAM: Access Code: [email or web address] URL: [email or web address] Date: 09/02/2022 Prepared by: Excell Seltzer  Exercises - Sit to Stand with Armchair  - 1 x daily - 7 x weekly - 3 sets - 10 reps - Supine Bridge  - 1 x daily - 7 x weekly - 3 sets - 10 reps - Supine Single Bent Knee Fallout  - 1 x daily - 7 x weekly - 3 sets - 5 reps - Standing Hip Abduction with Counter Support  - 1 x daily - 7 x weekly - 3 sets - 5 reps - Standing Hip Extension with Counter Support  - 1 x daily - 7 x weekly - 3 sets - 5 reps   GOALS: Goals reviewed with patient? Yes  SHORT TERM GOALS: Target date: 09/21/2022  Pt will be independent with initial HEP for improved strength, balance, transfers and gait. Baseline: Goal status: INITIAL  2.  Pt will improve gait velocity to at least 2.0 ft/sec for improved gait efficiency and performance at mod I level   Baseline: 1.5 ft/sec with SBQC (9/18) Goal status: INITIAL  3.  Pt will improve normal TUG to less than or equal to 20  seconds for improved functional mobility and decreased fall risk.  Baseline: 23.6 sec with SBQC (9/18) Goal status: INITIAL  4.  Pt will improve Berg score to 33/56 for decreased fall risk  Baseline: 29/56 (9/20) Goal status: INITIAL   LONG TERM GOALS: Target date: 10/26/2022  Pt will be independent with final HEP for improved strength, balance, transfers and gait. Baseline:  Goal status: INITIAL  2.  Pt will improve gait velocity to at least 2.5 ft/sec for improved gait efficiency and performance at mod I level   Baseline: 1.5 ft/sec with SBQC (9/18) Goal status: INITIAL  3.  Pt will improve normal TUG to less than or equal to 17 seconds for improved functional mobility and decreased fall risk.  Baseline: 23.6 sec with SBQC (9/18) Goal status: INITIAL  4.  Pt will improve Berg score to 37/56 for decreased fall risk  Baseline: 29/56 (9/20) Goal status: INITIAL  5.  Pt will improve 5 x STS to less than or equal to 18 seconds to demonstrate improved functional strength and transfer efficiency.  Baseline: 23.5 sec (9/18) Goal status: INITIAL  6.  Pt will verbalize fall prevention strategies and fall recovery strategies Baseline:  Goal status: INITIAL  ASSESSMENT:  CLINICAL IMPRESSION: Emphasis of skilled PT session on gait training w/various AFOs. Pt unsatisfied with her current AFO due to it bing too short on her tibia, poorly controlling her knee, and too big, as she has lost a large amount of weight since receiving the brace. Pt demonstrated reduction of LLE ER and improved knee stability w/use of SpryStep Plus AFO, but since patient received her brace <5  years ago, pt does not qualify for a new brace. Encouraged pt to call her insurance and request new brace due to her recurrent falls and weight change. Continue POC.    OBJECTIVE IMPAIRMENTS Abnormal gait, decreased balance, decreased cognition, decreased coordination, decreased endurance, decreased mobility, difficulty  walking, decreased strength, impaired perceived functional ability, increased muscle spasms, impaired sensation, impaired tone, impaired UE functional use, and pain.   ACTIVITY LIMITATIONS carrying, lifting, bending, squatting, stairs, transfers, bed mobility, bathing, dressing, and locomotion level  PARTICIPATION LIMITATIONS: cleaning, driving, shopping, and community activity  PERSONAL FACTORS Time since onset of injury/illness/exacerbation and 3+ comorbidities:    past medical history of hypertension, hyperlipidemia, right basal ganglia intracerebral hemorrhage in April 2017 with residual spastic left hemiplegia, depression, sleep apnea, anemia are also affecting patient's functional outcome.   REHAB POTENTIAL: Fair time since onset  CLINICAL DECISION MAKING: Stable/uncomplicated  EVALUATION COMPLEXITY: Moderate  PLAN: PT FREQUENCY: 2x/week  PT DURATION: 8 weeks (12 visits total)  PLANNED INTERVENTIONS: Therapeutic exercises, Therapeutic activity, Neuromuscular re-education, Balance training, Gait training, Patient/Family education, Self Care, Joint mobilization, Stair training, Vestibular training, Canalith repositioning, Visual/preceptual remediation/compensation, Orthotic/Fit training, DME instructions, Aquatic Therapy, Dry Needling, Cognitive remediation, Electrical stimulation, Wheelchair mobility training, Cryotherapy, Moist heat, Splintting, Taping, Manual therapy, and Re-evaluation  PLAN FOR NEXT SESSION: add to HEP for LLE strength/coordination/balance, work on increasing weight shift to the L/maintaining midline with standing, LLE NMR, staggered sit to stands, any LLE strengthening but especially hip abductors, trial AFOs   Calbert Hulsebus E Tateanna Bach, PT, DPT  09/11/2022, 1:22 PM

## 2022-09-14 ENCOUNTER — Telehealth: Payer: Self-pay | Admitting: Physical Therapy

## 2022-09-14 ENCOUNTER — Ambulatory Visit
Payer: No Typology Code available for payment source | Attending: Physical Medicine & Rehabilitation | Admitting: Physical Therapy

## 2022-09-14 DIAGNOSIS — M6281 Muscle weakness (generalized): Secondary | ICD-10-CM | POA: Diagnosis not present

## 2022-09-14 DIAGNOSIS — I69359 Hemiplegia and hemiparesis following cerebral infarction affecting unspecified side: Secondary | ICD-10-CM | POA: Diagnosis not present

## 2022-09-14 DIAGNOSIS — R269 Unspecified abnormalities of gait and mobility: Secondary | ICD-10-CM | POA: Diagnosis not present

## 2022-09-14 DIAGNOSIS — R2689 Other abnormalities of gait and mobility: Secondary | ICD-10-CM | POA: Insufficient documentation

## 2022-09-14 DIAGNOSIS — Z9181 History of falling: Secondary | ICD-10-CM | POA: Insufficient documentation

## 2022-09-14 DIAGNOSIS — R2681 Unsteadiness on feet: Secondary | ICD-10-CM | POA: Insufficient documentation

## 2022-09-14 DIAGNOSIS — I69352 Hemiplegia and hemiparesis following cerebral infarction affecting left dominant side: Secondary | ICD-10-CM

## 2022-09-14 NOTE — Therapy (Signed)
OUTPATIENT PHYSICAL THERAPY NEURO TREATMENT   Patient Name: Abigail Campos MRN: [id number] DOB:[birth year], 51 y.o., female Today's Date: 09/14/2022   PCP: Colon Branch, MD REFERRING PROVIDER: Charlett Blake, MD    PT End of Session - 09/14/22 1146     Visit Number 5    Number of Visits 13   with eval   Date for PT Re-Evaluation 10/26/22    Authorization Type [employer]    Progress Note Due on Visit 10    PT Start Time 1145    PT Stop Time 1231    PT Time Calculation (min) 46 min    Equipment Utilized During Treatment Gait belt;Other (comment)   L [place] AFO   Activity Tolerance Patient tolerated treatment well    Behavior During Therapy WFL for tasks assessed/performed                Past Medical History:  Diagnosis Date   Anemia    Anxiety and depression    Back pain    Essential hypertension 10/19/2007   03-2010: metoprolol changed to bystolic (was not feeling well on it, no specific allergy or reaction)     Food allergy    Hemiplegia (HCC)    Hyperlipemia    Hypertension    Joint pain    Migraine    Neuromuscular disorder ([place])    left sided hemiplegia - has brace for l leg and walks with a cane   Obesity    Ovarian cancer ([place])    Prediabetes    Sleep apnea    not wearing c-pap yet   Sleep apnea    Stroke ([place]) 2017   Vitamin D deficiency    Past Surgical History:  Procedure Laterality Date   ABDOMINAL HYSTERECTOMY  11-14-07   no oophorectomy per surgical report   CESAREAN SECTION     [id number]   INGUINAL HERNIA REPAIR     2004   Patient Active Problem List   Diagnosis Date Noted   Depression, major, single episode, severe ([place]) 07/23/2022   Vitamin D deficiency 07/06/2021   History of CVA (cerebrovascular accident) 07/06/2021   Other constipation 03/18/2021   Hot flashes 03/17/2021   Prediabetes 02/11/2021   Neuropathic pain 09/19/2020   Adjustment disorder with mixed anxiety and depressed mood 09/19/2020   Abnormality  of gait 05/09/2020   Hemiplegia of nondominant side, late effect of cerebrovascular disease ([place]) 03/28/2020   Spastic hemiplegia of left nondominant side due to nontraumatic intraparenchymal hemorrhage of brain ([place]) 12/26/2019   Trigger thumb of right hand 11/02/2019   OSA (obstructive sleep apnea) 07/26/2019   Spastic hemiplegia of left nondominant side as late effect of nontraumatic intraparenchymal hemorrhage of brain ([place]) 04/06/2019   Spastic hemiparesis affecting dominant side ([place]) 01/04/2018   Hyperglycemia 12/29/2016   Muscle spasticity    Hyperlipidemia 04/08/2016   Basal ganglia hemorrhage ([place]) 04/08/2016   Dysphagia as late effect of cerebrovascular disease    Migraine with aura and without status migrainosus, not intractable    Gait disturbance, post-stroke    Hemiplegia, post-stroke (HCC)    Aphasia, post-stroke    Dysarthria, post-stroke    ICH (intracerebral hemorrhage) ([place]) - R basal ganglia due to hypertensive emergency 04/01/2016   Upper airway cough syndrome 12/06/2015   PCP NOTES >>>>>>>>>>>>>>>>>>>>>>>>>>>.. 09/25/2015   Insomnia 12/28/2012   Annual physical exam 11/23/2011   Essential hypertension 10/19/2007    ONSET DATE: 08/18/2022   REFERRING DIAG: A63.016,W10.9 (ICD-10-CM) - Gait disturbance,  post-stroke   THERAPY DIAG:  Unsteadiness on feet  Other abnormalities of gait and mobility  Abnormality of gait  Muscle weakness (generalized)  History of falling  Rationale for Evaluation and Treatment Rehabilitation  SUBJECTIVE:                                                                                                                                                                                              SUBJECTIVE STATEMENT: Pt reports she spoke to her insurance company and her advocate said she should be a candidate for a new brace since she is having falls and since her current AFO is no longer appropriate for her. Pt also reports she  reached out to [facility] to try to schedule an appointment but hasn't heard back, it going to visit their office tomorrow as she has questions about an UE splint as well.  Pt accompanied by: self  PERTINENT HISTORY: hypertension, hyperlipidemia, right basal ganglia intracerebral hemorrhage in April 2017 with residual spastic left hemiplegia, depression, sleep apnea, anemia   PAIN:  Are you having pain? No   No pain at rest  PRECAUTIONS: Fall  FALLS: Has patient fallen in last 6 months? Yes. Number of falls 3, see subjective eval for details  PATIENT GOALS "just be able to walk again like my new normal after my stroke"  OBJECTIVE:   TODAY'S TREATMENT:  SELF-CARE: Educated pt on process of obtaining AFO, will reach out to referring provider for new AFO order to send to [place].    NMR:   Tall-kneeling on mat table with bench for RUE support: -sidestepping L/R for hip strengthening 3 x 5 ft each direction  -mini-squats 2 x 5 reps with manual assist for equal WB through BLE (needs assist to wt shift to the L)  -tall-kneeling on LLE while moving RLE fwd/back to increase WB on LLE, x 10 reps  -tall-kneeling on LLE while bringing RLE up so half-kneeling on LLE with RLE forwards, x 5 reps   -fwd/back scooting on mat table from long-sit to short-sit position to work on reciprocal movement at hips   -sit to stand with 4" block under RLE to encourage weight shift to the L, one LOB backwards and onset of cramping in L calf -transitioned to 2" block under RLE, 2 x 5 reps with mirror for visual feedback, manual assist to increase WB onto LLE and to unlock knee; pt exhibits most difficulty with eccentrically sitting and maintaining WB on LLE during transition   PATIENT EDUCATION: Education details: continue HEP, education on AFO process Person educated: Patient Education method: Explanation, Demonstration, Tactile cues, Verbal  cues, and Handouts Education comprehension: verbalized understanding  and needs further education   HOME EXERCISE PROGRAM: Access Code: [email or web address] URL: [email or web address] Date: 09/02/2022 Prepared by: Excell Seltzer  Exercises - Sit to Stand with Armchair  - 1 x daily - 7 x weekly - 3 sets - 10 reps - Supine Bridge  - 1 x daily - 7 x weekly - 3 sets - 10 reps - Supine Single Bent Knee Fallout  - 1 x daily - 7 x weekly - 3 sets - 5 reps - Standing Hip Abduction with Counter Support  - 1 x daily - 7 x weekly - 3 sets - 5 reps - Standing Hip Extension with Counter Support  - 1 x daily - 7 x weekly - 3 sets - 5 reps   GOALS: Goals reviewed with patient? Yes  SHORT TERM GOALS: Target date: 09/21/2022  Pt will be independent with initial HEP for improved strength, balance, transfers and gait. Baseline: Goal status: INITIAL  2.  Pt will improve gait velocity to at least 2.0 ft/sec for improved gait efficiency and performance at mod I level   Baseline: 1.5 ft/sec with SBQC (9/18) Goal status: INITIAL  3.  Pt will improve normal TUG to less than or equal to 20 seconds for improved functional mobility and decreased fall risk.  Baseline: 23.6 sec with SBQC (9/18) Goal status: INITIAL  4.  Pt will improve Berg score to 33/56 for decreased fall risk  Baseline: 29/56 (9/20) Goal status: INITIAL   LONG TERM GOALS: Target date: 10/26/2022  Pt will be independent with final HEP for improved strength, balance, transfers and gait. Baseline:  Goal status: INITIAL  2.  Pt will improve gait velocity to at least 2.5 ft/sec for improved gait efficiency and performance at mod I level   Baseline: 1.5 ft/sec with SBQC (9/18) Goal status: INITIAL  3.  Pt will improve normal TUG to less than or equal to 17 seconds for improved functional mobility and decreased fall risk.  Baseline: 23.6 sec with SBQC (9/18) Goal status: INITIAL  4.  Pt will improve Berg score to 37/56 for decreased fall risk  Baseline: 29/56 (9/20) Goal status:  INITIAL  5.  Pt will improve 5 x STS to less than or equal to 18 seconds to demonstrate improved functional strength and transfer efficiency.  Baseline: 23.5 sec (9/18) Goal status: INITIAL  6.  Pt will verbalize fall prevention strategies and fall recovery strategies Baseline:  Goal status: INITIAL  ASSESSMENT:  CLINICAL IMPRESSION: Emphasis of skilled PT session on working on NMR of LLE in tall-kneeling position and with sit to stands. Pt continues to exhibit significant LLE weakness at the hips and decreased WB on L side with transfers and gait. Pt does well with exercises in tall-kneeling position but does fatigue quickly and need frequent rest breaks. Pt continues to benefit from skilled therapy services to address ongoing L hemi-body weakness leading to gait impairments and increased fall risk. Continue POC.    OBJECTIVE IMPAIRMENTS Abnormal gait, decreased balance, decreased cognition, decreased coordination, decreased endurance, decreased mobility, difficulty walking, decreased strength, impaired perceived functional ability, increased muscle spasms, impaired sensation, impaired tone, impaired UE functional use, and pain.   ACTIVITY LIMITATIONS carrying, lifting, bending, squatting, stairs, transfers, bed mobility, bathing, dressing, and locomotion level  PARTICIPATION LIMITATIONS: cleaning, driving, shopping, and community activity  PERSONAL FACTORS Time since onset of injury/illness/exacerbation and 3+ comorbidities:    past medical history of hypertension, hyperlipidemia, right basal ganglia intracerebral hemorrhage  in April 2017 with residual spastic left hemiplegia, depression, sleep apnea, anemia are also affecting patient's functional outcome.   REHAB POTENTIAL: Fair time since onset  CLINICAL DECISION MAKING: Stable/uncomplicated  EVALUATION COMPLEXITY: Moderate  PLAN: PT FREQUENCY: 2x/week  PT DURATION: 8 weeks (12 visits total)  PLANNED INTERVENTIONS: Therapeutic  exercises, Therapeutic activity, Neuromuscular re-education, Balance training, Gait training, Patient/Family education, Self Care, Joint mobilization, Stair training, Vestibular training, Canalith repositioning, Visual/preceptual remediation/compensation, Orthotic/Fit training, DME instructions, Aquatic Therapy, Dry Needling, Cognitive remediation, Electrical stimulation, Wheelchair mobility training, Cryotherapy, Moist heat, Splintting, Taping, Manual therapy, and Re-evaluation  PLAN FOR NEXT SESSION: add to HEP for LLE strength/coordination/balance, work on increasing weight shift to the L/maintaining midline with standing, LLE NMR, staggered sit to stands, any LLE strengthening but especially hip abductors, trial AFOs, tall-kneeling   Excell Seltzer, PT, DPT, CSRS  09/14/2022, 12:32 PM

## 2022-09-14 NOTE — Telephone Encounter (Signed)
Order placed for LE AFO.

## 2022-09-14 NOTE — Telephone Encounter (Signed)
Dr. Letta Pate,  Abigail Campos is being seen by Physical Therapy and would benefit from an order for a new left LE AFO.   If you agree, please place an order in [facility] workque in [facility] or fax the order to [phone]. Thank you, Excell Seltzer, PT, DPT, [facility] [address] [place], [place]  [address] Phone:  [phone] Fax:  [phone]

## 2022-09-16 ENCOUNTER — Ambulatory Visit: Payer: No Typology Code available for payment source | Admitting: Physical Therapy

## 2022-09-16 DIAGNOSIS — R2689 Other abnormalities of gait and mobility: Secondary | ICD-10-CM

## 2022-09-16 DIAGNOSIS — M6281 Muscle weakness (generalized): Secondary | ICD-10-CM | POA: Diagnosis not present

## 2022-09-16 DIAGNOSIS — R2681 Unsteadiness on feet: Secondary | ICD-10-CM

## 2022-09-16 DIAGNOSIS — I69359 Hemiplegia and hemiparesis following cerebral infarction affecting unspecified side: Secondary | ICD-10-CM | POA: Diagnosis not present

## 2022-09-16 DIAGNOSIS — Z9181 History of falling: Secondary | ICD-10-CM

## 2022-09-16 DIAGNOSIS — R269 Unspecified abnormalities of gait and mobility: Secondary | ICD-10-CM

## 2022-09-16 NOTE — Therapy (Signed)
OUTPATIENT PHYSICAL THERAPY NEURO TREATMENT   Patient Name: Abigail Campos MRN: [id number] DOB:07-30-1971, 51 y.o., female Today's Date: 09/16/2022   PCP: Colon Branch, MD REFERRING PROVIDER: Charlett Blake, MD    PT End of Session - 09/16/22 1151     Visit Number 6    Number of Visits 13   with eval   Date for PT Re-Evaluation 10/26/22    Authorization Type [employer] Medicare    Progress Note Due on Visit 10    PT Start Time 1150    PT Stop Time 1232    PT Time Calculation (min) 42 min    Equipment Utilized During Treatment Gait belt;Other (comment)   L [place] AFO   Activity Tolerance Patient tolerated treatment well    Behavior During Therapy WFL for tasks assessed/performed                 Past Medical History:  Diagnosis Date   Anemia    Anxiety and depression    Back pain    Essential hypertension 10/19/2007   03-2010: metoprolol changed to bystolic (was not feeling well on it, no specific allergy or reaction)     Food allergy    Hemiplegia (HCC)    Hyperlipemia    Hypertension    Joint pain    Migraine    Neuromuscular disorder ([place])    left sided hemiplegia - has brace for l leg and walks with a cane   Obesity    Ovarian cancer ([place])    Prediabetes    Sleep apnea    not wearing c-pap yet   Sleep apnea    Stroke ([place]) 2017   Vitamin D deficiency    Past Surgical History:  Procedure Laterality Date   ABDOMINAL HYSTERECTOMY  11-14-07   no oophorectomy per surgical report   CESAREAN SECTION     [id number]   INGUINAL HERNIA REPAIR     2004   Patient Active Problem List   Diagnosis Date Noted   Depression, major, single episode, severe ([place]) 07/23/2022   Vitamin D deficiency 07/06/2021   History of CVA (cerebrovascular accident) 07/06/2021   Other constipation 03/18/2021   Hot flashes 03/17/2021   Prediabetes 02/11/2021   Neuropathic pain 09/19/2020   Adjustment disorder with mixed anxiety and depressed mood 09/19/2020   Abnormality  of gait 05/09/2020   Hemiplegia of nondominant side, late effect of cerebrovascular disease ([place]) 03/28/2020   Spastic hemiplegia of left nondominant side due to nontraumatic intraparenchymal hemorrhage of brain ([place]) 12/26/2019   Trigger thumb of right hand 11/02/2019   OSA (obstructive sleep apnea) 07/26/2019   Spastic hemiplegia of left nondominant side as late effect of nontraumatic intraparenchymal hemorrhage of brain ([place]) 04/06/2019   Spastic hemiparesis affecting dominant side ([place]) 01/04/2018   Hyperglycemia 12/29/2016   Muscle spasticity    Hyperlipidemia 04/08/2016   Basal ganglia hemorrhage ([place]) 04/08/2016   Dysphagia as late effect of cerebrovascular disease    Migraine with aura and without status migrainosus, not intractable    Gait disturbance, post-stroke    Hemiplegia, post-stroke (HCC)    Aphasia, post-stroke    Dysarthria, post-stroke    ICH (intracerebral hemorrhage) ([place]) - R basal ganglia due to hypertensive emergency 04/01/2016   Upper airway cough syndrome 12/06/2015   PCP NOTES >>>>>>>>>>>>>>>>>>>>>>>>>>>.. 09/25/2015   Insomnia 12/28/2012   Annual physical exam 11/23/2011   Essential hypertension 10/19/2007    ONSET DATE: 08/18/2022   REFERRING DIAG: H08.657,Q46.9 (ICD-10-CM) - Gait  disturbance, post-stroke   THERAPY DIAG:  Abnormality of gait  Unsteadiness on feet  Other abnormalities of gait and mobility  Muscle weakness (generalized)  History of falling  Rationale for Evaluation and Treatment Rehabilitation  SUBJECTIVE:                                                                                                                                                                                              SUBJECTIVE STATEMENT: Pt reports no pain today, no falls or near falls since last session. Pt has not reached out to [facility] yet, will call next week if she hasn't heard from them about scheduling an appointment (paperwork sent over by  therapist 10/3).  Pt accompanied by: self  PERTINENT HISTORY: hypertension, hyperlipidemia, right basal ganglia intracerebral hemorrhage in April 2017 with residual spastic left hemiplegia, depression, sleep apnea, anemia   PAIN:  Are you having pain? No   No pain at rest  PRECAUTIONS: Fall  FALLS: Has patient fallen in last 6 months? Yes. Number of falls 3, see subjective eval for details  PATIENT GOALS "just be able to walk again like my new normal after my stroke"  OBJECTIVE:   TODAY'S TREATMENT:   NMR: Standing alt LLE and RLE foam block step-overs to target x 10 reps each with focus on increasing L hip and knee flexion to clear block, L heel strike, and increasing stance time on LLE as well as weight shift to the L while moving RLE.  Sit to stands from [facility] with 2" block under RLE, focus on maintaining midline with increased weight shift onto LLE, use of mirror for visual feedback, 2 x 10 reps with manual assist to weight shift to the L. Without use of mirror for visual feedback and manual assist from therapist pt exhibits significantly more weight on her RLE than LLE.  GAIT: Gait pattern: decreased arm swing- Left, decreased hip/knee flexion- Left, decreased ankle dorsiflexion- Left, and circumduction- Left Distance walked: 115 ft Assistive device utilized: Quad cane small base Level of assistance: Modified independence Comments: trial gait again with LLE SpryStep AFO, decreased circumduction noted with use of brace  PATIENT EDUCATION: Education details: continue HEP, education on AFO process Person educated: Patient Education method: Explanation, Demonstration, Tactile cues, Verbal cues, and Handouts Education comprehension: verbalized understanding and needs further education   HOME EXERCISE PROGRAM: Access Code: [email or web address] URL: [email or web address] Date: 09/02/2022 Prepared by: Excell Seltzer  Exercises - Sit to Stand with Armchair  - 1 x daily - 7 x  weekly - 3 sets - 10 reps - Supine Bridge  - 1 x daily - 7  x weekly - 3 sets - 10 reps - Supine Single Bent Knee Fallout  - 1 x daily - 7 x weekly - 3 sets - 5 reps - Standing Hip Abduction with Counter Support  - 1 x daily - 7 x weekly - 3 sets - 5 reps - Standing Hip Extension with Counter Support  - 1 x daily - 7 x weekly - 3 sets - 5 reps   GOALS: Goals reviewed with patient? Yes  SHORT TERM GOALS: Target date: 09/21/2022  Pt will be independent with initial HEP for improved strength, balance, transfers and gait. Baseline: Goal status: INITIAL  2.  Pt will improve gait velocity to at least 2.0 ft/sec for improved gait efficiency and performance at mod I level   Baseline: 1.5 ft/sec with SBQC (9/18) Goal status: INITIAL  3.  Pt will improve normal TUG to less than or equal to 20 seconds for improved functional mobility and decreased fall risk.  Baseline: 23.6 sec with SBQC (9/18) Goal status: INITIAL  4.  Pt will improve Berg score to 33/56 for decreased fall risk  Baseline: 29/56 (9/20) Goal status: INITIAL   LONG TERM GOALS: Target date: 10/26/2022  Pt will be independent with final HEP for improved strength, balance, transfers and gait. Baseline:  Goal status: INITIAL  2.  Pt will improve gait velocity to at least 2.5 ft/sec for improved gait efficiency and performance at mod I level   Baseline: 1.5 ft/sec with SBQC (9/18) Goal status: INITIAL  3.  Pt will improve normal TUG to less than or equal to 17 seconds for improved functional mobility and decreased fall risk.  Baseline: 23.6 sec with SBQC (9/18) Goal status: INITIAL  4.  Pt will improve Berg score to 37/56 for decreased fall risk  Baseline: 29/56 (9/20) Goal status: INITIAL  5.  Pt will improve 5 x STS to less than or equal to 18 seconds to demonstrate improved functional strength and transfer efficiency.  Baseline: 23.5 sec (9/18) Goal status: INITIAL  6.  Pt will verbalize fall prevention  strategies and fall recovery strategies Baseline:  Goal status: INITIAL  ASSESSMENT:  CLINICAL IMPRESSION: Emphasis of skilled PT session on reassessing gait with SpryStep to confirm that therapy is recommending this AFO as well as continuing to work on [place]. Pt exhibits improved gait mechanics including decreased L hip circumduction during gait with use of this AFO vs with patient's current AFO. AFO order faxed over to [facility] clinic, awaiting pt appointment to be scheduled. Pt continues to exhibit decreased hip and knee flexion during gait as well due to ongoing LLE weakness. Pt also continues to exhibit decreased weight shift onto LLE during sit to stand transfers. Pt continues to benefit from skilled therapy services to address these ongoing impairments and decrease her fall risk. Continue POC.    OBJECTIVE IMPAIRMENTS Abnormal gait, decreased balance, decreased cognition, decreased coordination, decreased endurance, decreased mobility, difficulty walking, decreased strength, impaired perceived functional ability, increased muscle spasms, impaired sensation, impaired tone, impaired UE functional use, and pain.   ACTIVITY LIMITATIONS carrying, lifting, bending, squatting, stairs, transfers, bed mobility, bathing, dressing, and locomotion level  PARTICIPATION LIMITATIONS: cleaning, driving, shopping, and community activity  PERSONAL FACTORS Time since onset of injury/illness/exacerbation and 3+ comorbidities:    past medical history of hypertension, hyperlipidemia, right basal ganglia intracerebral hemorrhage in April 2017 with residual spastic left hemiplegia, depression, sleep apnea, anemia are also affecting patient's functional outcome.   REHAB POTENTIAL: Fair time since onset  CLINICAL DECISION MAKING: Stable/uncomplicated  EVALUATION COMPLEXITY: Moderate  PLAN: PT FREQUENCY: 2x/week  PT DURATION: 8 weeks (12 visits total)  PLANNED INTERVENTIONS: Therapeutic exercises,  Therapeutic activity, Neuromuscular re-education, Balance training, Gait training, Patient/Family education, Self Care, Joint mobilization, Stair training, Vestibular training, Canalith repositioning, Visual/preceptual remediation/compensation, Orthotic/Fit training, DME instructions, Aquatic Therapy, Dry Needling, Cognitive remediation, Electrical stimulation, Wheelchair mobility training, Cryotherapy, Moist heat, Splintting, Taping, Manual therapy, and Re-evaluation  PLAN FOR NEXT SESSION: add to HEP for LLE strength/coordination/balance, work on increasing weight shift to the L/maintaining midline with standing, LLE NMR, staggered sit to stands, any LLE strengthening but especially hip abductors, trial AFOs, tall-kneeling, STG assess next session   Excell Seltzer, PT, DPT, CSRS  09/16/2022, 1:02 PM

## 2022-09-17 ENCOUNTER — Ambulatory Visit: Payer: No Typology Code available for payment source | Admitting: Neurology

## 2022-09-19 ENCOUNTER — Other Ambulatory Visit: Payer: Self-pay | Admitting: Internal Medicine

## 2022-09-21 ENCOUNTER — Ambulatory Visit: Payer: No Typology Code available for payment source | Admitting: Physical Therapy

## 2022-09-21 MED ORDER — LOSARTAN POTASSIUM 100 MG PO TABS: 100.0000 mg | ORAL_TABLET | Freq: Every day | ORAL | 1 refills | Status: DC

## 2022-09-22 ENCOUNTER — Ambulatory Visit: Payer: No Typology Code available for payment source | Admitting: Physical Therapy

## 2022-09-23 ENCOUNTER — Ambulatory Visit: Payer: No Typology Code available for payment source | Admitting: Physical Therapy

## 2022-09-25 ENCOUNTER — Ambulatory Visit: Payer: No Typology Code available for payment source | Admitting: Physical Therapy

## 2022-09-25 ENCOUNTER — Encounter: Payer: Self-pay | Admitting: Physical Therapy

## 2022-09-25 DIAGNOSIS — R269 Unspecified abnormalities of gait and mobility: Secondary | ICD-10-CM

## 2022-09-25 DIAGNOSIS — Z9181 History of falling: Secondary | ICD-10-CM | POA: Diagnosis not present

## 2022-09-25 DIAGNOSIS — R2681 Unsteadiness on feet: Secondary | ICD-10-CM

## 2022-09-25 DIAGNOSIS — R2689 Other abnormalities of gait and mobility: Secondary | ICD-10-CM

## 2022-09-25 DIAGNOSIS — M6281 Muscle weakness (generalized): Secondary | ICD-10-CM | POA: Diagnosis not present

## 2022-09-25 DIAGNOSIS — I69359 Hemiplegia and hemiparesis following cerebral infarction affecting unspecified side: Secondary | ICD-10-CM | POA: Diagnosis not present

## 2022-09-25 NOTE — Therapy (Signed)
OUTPATIENT PHYSICAL THERAPY NEURO TREATMENT   Patient Name: Abigail Campos MRN: [id number] DOB:[birth year], 51 y.o., female Today's Date: 09/25/2022   PCP: Colon Branch, MD REFERRING PROVIDER: Charlett Blake, MD    PT End of Session - 09/25/22 1409     Visit Number 7    Number of Visits 13   with eval   Date for PT Re-Evaluation 10/26/22    Authorization Type [employer]    Progress Note Due on Visit 10    PT Start Time 1404    PT Stop Time 1442    PT Time Calculation (min) 38 min    Equipment Utilized During Treatment Gait belt;Other (comment)   L [place] AFO   Activity Tolerance Patient tolerated treatment well    Behavior During Therapy WFL for tasks assessed/performed                 Past Medical History:  Diagnosis Date   Anemia    Anxiety and depression    Back pain    Essential hypertension 10/19/2007   03-2010: metoprolol changed to bystolic (was not feeling well on it, no specific allergy or reaction)     Food allergy    Hemiplegia (HCC)    Hyperlipemia    Hypertension    Joint pain    Migraine    Neuromuscular disorder ([place])    left sided hemiplegia - has brace for l leg and walks with a cane   Obesity    Ovarian cancer ([place])    Prediabetes    Sleep apnea    not wearing c-pap yet   Sleep apnea    Stroke ([place]) 2017   Vitamin D deficiency    Past Surgical History:  Procedure Laterality Date   ABDOMINAL HYSTERECTOMY  11-14-07   no oophorectomy per surgical report   CESAREAN SECTION     [id number]   INGUINAL HERNIA REPAIR     2004   Patient Active Problem List   Diagnosis Date Noted   Depression, major, single episode, severe ([place]) 07/23/2022   Vitamin D deficiency 07/06/2021   History of CVA (cerebrovascular accident) 07/06/2021   Other constipation 03/18/2021   Hot flashes 03/17/2021   Prediabetes 02/11/2021   Neuropathic pain 09/19/2020   Adjustment disorder with mixed anxiety and depressed mood 09/19/2020    Abnormality of gait 05/09/2020   Hemiplegia of nondominant side, late effect of cerebrovascular disease ([place]) 03/28/2020   Spastic hemiplegia of left nondominant side due to nontraumatic intraparenchymal hemorrhage of brain ([place]) 12/26/2019   Trigger thumb of right hand 11/02/2019   OSA (obstructive sleep apnea) 07/26/2019   Spastic hemiplegia of left nondominant side as late effect of nontraumatic intraparenchymal hemorrhage of brain ([place]) 04/06/2019   Spastic hemiparesis affecting dominant side ([place]) 01/04/2018   Hyperglycemia 12/29/2016   Muscle spasticity    Hyperlipidemia 04/08/2016   Basal ganglia hemorrhage ([place]) 04/08/2016   Dysphagia as late effect of cerebrovascular disease    Migraine with aura and without status migrainosus, not intractable    Gait disturbance, post-stroke    Hemiplegia, post-stroke (HCC)    Aphasia, post-stroke    Dysarthria, post-stroke    ICH (intracerebral hemorrhage) ([place]) - R basal ganglia due to hypertensive emergency 04/01/2016   Upper airway cough syndrome 12/06/2015   PCP NOTES >>>>>>>>>>>>>>>>>>>>>>>>>>>.. 09/25/2015   Insomnia 12/28/2012   Annual physical exam 11/23/2011   Essential hypertension 10/19/2007    ONSET DATE: 08/18/2022   REFERRING DIAG: Y07.371,G62.6 (ICD-10-CM) - Gait  disturbance, post-stroke   THERAPY DIAG:  Abnormality of gait  Unsteadiness on feet  Other abnormalities of gait and mobility  Muscle weakness (generalized)  History of falling  Rationale for Evaluation and Treatment Rehabilitation  SUBJECTIVE:                                                                                                                                                                                              SUBJECTIVE STATEMENT: Pt reports no pain today, no falls or near falls since last session. Pt has not heard from or reached out to [facility].  She had her tooth pulled Monday so has been busy dealing with that.  Pt accompanied  by: self  PERTINENT HISTORY: hypertension, hyperlipidemia, right basal ganglia intracerebral hemorrhage in April 2017 with residual spastic left hemiplegia, depression, sleep apnea, anemia   PAIN:  Are you having pain? No   No pain at rest  PRECAUTIONS: Fall  FALLS: Has patient fallen in last 6 months? Yes. Number of falls 3, see subjective eval for details  PATIENT GOALS "just be able to walk again like my new normal after my stroke"  OBJECTIVE:   TODAY'S TREATMENT:   Assessed STGs: -Verbally reviewed HEP: pt states STS and standing counter exercises are easy.  Would benefit from advancement. -10MWT w/ SBQC/rubber tip: 18.15 sec = 0.55 m/sec OR 1.82 ft/sec -TUG (trial 1) = 18.34 sec w/ SBQC -TUG (trial 2) = 19.13 sec w/ SBQC -TUG (trial 3) = 18.78 sec w/ SBQC -TUG (avg) = 18.75 sec w/ SBQC -Assessed BERG:  [facility] PT Assessment - 09/25/22 1427       Berg Balance Test   Sit to Stand Able to stand without using hands and stabilize independently    Standing Unsupported Able to stand safely 2 minutes    Sitting with Back Unsupported but Feet Supported on Floor or Stool Able to sit safely and securely 2 minutes    Stand to Sit Sits safely with minimal use of hands    Transfers Able to transfer safely, definite need of hands    Standing Unsupported with Eyes Closed Able to stand 10 seconds with supervision    Standing Unsupported with Feet Together Able to place feet together independently and stand 1 minute safely    From Standing, Reach Forward with Outstretched Arm Can reach forward >5 cm safely (2")    From Standing Position, Pick up Object from Floor Able to pick up shoe, needs supervision    From Standing Position, Turn to Look Behind Over each Shoulder Turn sideways only but maintains balance   pt very anxious  Turn 360 Degrees Needs close supervision or verbal cueing    Standing Unsupported, Alternately Place Feet on Step/Stool Able to complete >2 steps/needs minimal assist     Standing Unsupported, One Foot in Front Able to take small step independently and hold 30 seconds    Standing on One Leg Tries to lift leg/unable to hold 3 seconds but remains standing independently    Total Score 38    Berg comment: Significant fall risk; areas of difficulty = eyes closed, tandem stance, alternating toe tap, SLS, forward reach, reach to floor, turning            PATIENT EDUCATION: Education details: continue HEP, discussed current PT talking to prior PT regarding AFO order and remaining needs.  Edu to have pt reach out to [facility] in case they need anything else from clinic-pt has contact. Person educated: Patient Education method: Explanation, Demonstration, Tactile cues, Verbal cues, and Handouts Education comprehension: verbalized understanding and needs further education   HOME EXERCISE PROGRAM: Access Code: [email or web address] URL: [email or web address] Date: 09/02/2022 Prepared by: Peter Congo  Exercises - Sit to Stand with Armchair  - 1 x daily - 7 x weekly - 3 sets - 10 reps - Supine Bridge  - 1 x daily - 7 x weekly - 3 sets - 10 reps - Supine Single Bent Knee Fallout  - 1 x daily - 7 x weekly - 3 sets - 5 reps - Standing Hip Abduction with Counter Support  - 1 x daily - 7 x weekly - 3 sets - 5 reps - Standing Hip Extension with Counter Support  - 1 x daily - 7 x weekly - 3 sets - 5 reps   GOALS: Goals reviewed with patient? Yes  SHORT TERM GOALS: Target date: 09/21/2022  Pt will be independent with initial HEP for improved strength, balance, transfers and gait. Baseline:  09/25/2022 Established, pt compliant, needs advancement. Goal status: MET  2.  Pt will improve gait velocity to at least 2.0 ft/sec for improved gait efficiency and performance at mod I level   Baseline: 1.5 ft/sec with SBQC (9/18); 1.8 ft/sec w/ SBQC (10/13) Goal status: IN PROGRESS  3.  Pt will improve normal TUG to less than or equal to 20 seconds for improved  functional mobility and decreased fall risk.  Baseline: 23.6 sec with SBQC (9/18); 18.75 sec w/ SBQC (10/13) Goal status: MET  4.  Pt will improve Berg score to 33/56 for decreased fall risk  Baseline: 29/56 (9/20); 38/56 (10/13) Goal status: MET   LONG TERM GOALS: Target date: 10/26/2022  Pt will be independent with final HEP for improved strength, balance, transfers and gait. Baseline:  Goal status: INITIAL  2.  Pt will improve gait velocity to at least 2.5 ft/sec for improved gait efficiency and performance at mod I level   Baseline: 1.5 ft/sec with SBQC (9/18) Goal status: INITIAL  3.  Pt will improve normal TUG to less than or equal to 17 seconds for improved functional mobility and decreased fall risk.  Baseline: 23.6 sec with SBQC (9/18) Goal status: INITIAL  4.  Pt will improve Berg score to 40/56 for decreased fall risk  Baseline: 29/56 (9/20); 38/56 (10/13) Goal status: REVISED  5.  Pt will improve 5 x STS to less than or equal to 18 seconds to demonstrate improved functional strength and transfer efficiency.  Baseline: 23.5 sec (9/18) Goal status: INITIAL  6.  Pt will verbalize fall prevention strategies and fall recovery strategies Baseline:  Goal status: INITIAL  ASSESSMENT:  CLINICAL IMPRESSION: Assessed STGs this visit with pt drastically improving BERG balance assessment to score of 38/56.  Her TUG average was 18.75 seconds today with rubber tip quad cane.  She ambulates with a gait speed of 1.8 ft/sec which is improved from baseline.  Overall, she is making progress towards LTGs and continue to benefit from skilled PT to address safety and improved mechanics of mobility.   OBJECTIVE IMPAIRMENTS Abnormal gait, decreased balance, decreased cognition, decreased coordination, decreased endurance, decreased mobility, difficulty walking, decreased strength, impaired perceived functional ability, increased muscle spasms, impaired sensation, impaired tone,  impaired UE functional use, and pain.   ACTIVITY LIMITATIONS carrying, lifting, bending, squatting, stairs, transfers, bed mobility, bathing, dressing, and locomotion level  PARTICIPATION LIMITATIONS: cleaning, driving, shopping, and community activity  PERSONAL FACTORS Time since onset of injury/illness/exacerbation and 3+ comorbidities:    past medical history of hypertension, hyperlipidemia, right basal ganglia intracerebral hemorrhage in April 2017 with residual spastic left hemiplegia, depression, sleep apnea, anemia are also affecting patient's functional outcome.   REHAB POTENTIAL: Fair time since onset  CLINICAL DECISION MAKING: Stable/uncomplicated  EVALUATION COMPLEXITY: Moderate  PLAN: PT FREQUENCY: 2x/week  PT DURATION: 8 weeks (12 visits total)  PLANNED INTERVENTIONS: Therapeutic exercises, Therapeutic activity, Neuromuscular re-education, Balance training, Gait training, Patient/Family education, Self Care, Joint mobilization, Stair training, Vestibular training, Canalith repositioning, Visual/preceptual remediation/compensation, Orthotic/Fit training, DME instructions, Aquatic Therapy, Dry Needling, Cognitive remediation, Electrical stimulation, Wheelchair mobility training, Cryotherapy, Moist heat, Splintting, Taping, Manual therapy, and Re-evaluation  PLAN FOR NEXT SESSION: Advance HEP for LLE strength/coordination/balance, work on increasing weight shift to the L/maintaining midline with standing, LLE NMR, staggered sit to stands, any LLE strengthening but especially hip abductors, trial AFOs, tall-kneeling; pt has to reach out to [facility] and MD regarding new AFO due to 4 years since received   Bary Richard, PT, DPT 09/25/2022, 2:50 PM

## 2022-09-28 ENCOUNTER — Ambulatory Visit: Payer: No Typology Code available for payment source | Admitting: Physical Therapy

## 2022-09-28 ENCOUNTER — Encounter: Payer: Self-pay | Admitting: Physical Therapy

## 2022-09-28 DIAGNOSIS — R2689 Other abnormalities of gait and mobility: Secondary | ICD-10-CM | POA: Diagnosis not present

## 2022-09-28 DIAGNOSIS — M6281 Muscle weakness (generalized): Secondary | ICD-10-CM | POA: Diagnosis not present

## 2022-09-28 DIAGNOSIS — R2681 Unsteadiness on feet: Secondary | ICD-10-CM

## 2022-09-28 DIAGNOSIS — I69359 Hemiplegia and hemiparesis following cerebral infarction affecting unspecified side: Secondary | ICD-10-CM | POA: Diagnosis not present

## 2022-09-28 DIAGNOSIS — R269 Unspecified abnormalities of gait and mobility: Secondary | ICD-10-CM

## 2022-09-28 DIAGNOSIS — Z9181 History of falling: Secondary | ICD-10-CM | POA: Diagnosis not present

## 2022-09-28 NOTE — Patient Instructions (Signed)
Access Code: [email or web address] URL: [email or web address] Date: 09/28/2022 Prepared by: Elease Etienne  Exercises - Sit to Stand with Armchair  - 1 x daily - 7 x weekly - 3 sets - 10 reps - Supine Bridge  - 1 x daily - 7 x weekly - 3 sets - 10 reps - Supine Single Bent Knee Fallout  - 1 x daily - 7 x weekly - 3 sets - 5 reps - Backward Walking with Counter Support  - 1 x daily - 7 x weekly - 3 sets - 10 reps - Side Stepping with Resistance at Thighs and Counter Support  - 1 x daily - 7 x weekly - 3 sets - 10 reps

## 2022-09-28 NOTE — Therapy (Signed)
OUTPATIENT PHYSICAL THERAPY NEURO TREATMENT   Patient Name: Abigail Campos MRN: [id number] DOB:Mar 14, 1971, 51 y.o., female Today's Date: 09/28/2022   PCP: Colon Branch, MD REFERRING PROVIDER: Charlett Blake, MD    PT End of Session - 09/28/22 1109     Visit Number 8    Number of Visits 13   with eval   Date for PT Re-Evaluation 10/26/22    Authorization Type [employer] Medicare    Progress Note Due on Visit 10    PT Start Time 1103    PT Stop Time 1148    PT Time Calculation (min) 45 min    Equipment Utilized During Treatment Gait belt;Other (comment)   L [place] AFO   Activity Tolerance Patient tolerated treatment well    Behavior During Therapy WFL for tasks assessed/performed                 Past Medical History:  Diagnosis Date   Anemia    Anxiety and depression    Back pain    Essential hypertension 10/19/2007   03-2010: metoprolol changed to bystolic (was not feeling well on it, no specific allergy or reaction)     Food allergy    Hemiplegia (HCC)    Hyperlipemia    Hypertension    Joint pain    Migraine    Neuromuscular disorder ([place])    left sided hemiplegia - has brace for l leg and walks with a cane   Obesity    Ovarian cancer ([place])    Prediabetes    Sleep apnea    not wearing c-pap yet   Sleep apnea    Stroke ([place]) 2017   Vitamin D deficiency    Past Surgical History:  Procedure Laterality Date   ABDOMINAL HYSTERECTOMY  11-14-07   no oophorectomy per surgical report   CESAREAN SECTION     [id number]   INGUINAL HERNIA REPAIR     2004   Patient Active Problem List   Diagnosis Date Noted   Depression, major, single episode, severe ([place]) 07/23/2022   Vitamin D deficiency 07/06/2021   History of CVA (cerebrovascular accident) 07/06/2021   Other constipation 03/18/2021   Hot flashes 03/17/2021   Prediabetes 02/11/2021   Neuropathic pain 09/19/2020   Adjustment disorder with mixed anxiety and depressed mood 09/19/2020    Abnormality of gait 05/09/2020   Hemiplegia of nondominant side, late effect of cerebrovascular disease ([place]) 03/28/2020   Spastic hemiplegia of left nondominant side due to nontraumatic intraparenchymal hemorrhage of brain ([place]) 12/26/2019   Trigger thumb of right hand 11/02/2019   OSA (obstructive sleep apnea) 07/26/2019   Spastic hemiplegia of left nondominant side as late effect of nontraumatic intraparenchymal hemorrhage of brain ([place]) 04/06/2019   Spastic hemiparesis affecting dominant side ([place]) 01/04/2018   Hyperglycemia 12/29/2016   Muscle spasticity    Hyperlipidemia 04/08/2016   Basal ganglia hemorrhage ([place]) 04/08/2016   Dysphagia as late effect of cerebrovascular disease    Migraine with aura and without status migrainosus, not intractable    Gait disturbance, post-stroke    Hemiplegia, post-stroke (HCC)    Aphasia, post-stroke    Dysarthria, post-stroke    ICH (intracerebral hemorrhage) ([place]) - R basal ganglia due to hypertensive emergency 04/01/2016   Upper airway cough syndrome 12/06/2015   PCP NOTES >>>>>>>>>>>>>>>>>>>>>>>>>>>.. 09/25/2015   Insomnia 12/28/2012   Annual physical exam 11/23/2011   Essential hypertension 10/19/2007    ONSET DATE: 08/18/2022   REFERRING DIAG: G92.010,O71.2 (ICD-10-CM) - Gait  disturbance, post-stroke   THERAPY DIAG:  Abnormality of gait  Unsteadiness on feet  Other abnormalities of gait and mobility  Muscle weakness (generalized)  History of falling  Rationale for Evaluation and Treatment Rehabilitation  SUBJECTIVE:                                                                                                                                                                                              SUBJECTIVE STATEMENT: Pt denies pain or any recent falls.  States she is stiff today, but had a good weekend.  Pt accompanied by: self  PERTINENT HISTORY: hypertension, hyperlipidemia, right basal ganglia intracerebral  hemorrhage in April 2017 with residual spastic left hemiplegia, depression, sleep apnea, anemia   PAIN:  Are you having pain? No   No pain at rest  PRECAUTIONS: Fall  FALLS: Has patient fallen in last 6 months? Yes. Number of falls 3, see subjective eval for details  PATIENT GOALS "just be able to walk again like my new normal after my stroke"  OBJECTIVE:   TODAY'S TREATMENT:  -STS no UE x10, cues to have pt look at left foot and adjust heel so foot is straight reducing torque on the hip prior to sitting -Bridges w/ green theraband 2x6, cued to flatten back to mat prior to lift as pt uses hands to support low back -bent knee fallouts x8 each LE -Standing hip abd/ext x10 LLE only, major trunk lean compensation noted so modified HEP to limit compensations. -Lateral stepping w/ green theraband at countertop 3x10' each direction, pt has shortened step w/ LLE leading, but decreased compensation noted. -backwards walking at countertop 3x10', cued pt to pay attention to heel position if initiating step w/ LLE -Trialed tandem w/ LLE behind and RUE support on counter x30 sec, pt has too much left knee bend and difficulty getting out of position independently to consider safe for home performance without spouse her to observe guarding technique. -Squats w/ counter support and chair behind x6, PT blocks toe of left foot to prevent ER and assist engagement of left hip to task  PT pulled secondary PT more familiar with pt to address questions concerning AFO and potential for discharge at end of current POC.  PATIENT EDUCATION: Education details: Additions/modifications to HEP. Person educated: Patient Education method: Explanation, Demonstration, Tactile cues, Verbal cues, and Handouts Education comprehension: verbalized understanding and needs further education   HOME EXERCISE PROGRAM: Access Code: [email or web address] URL: [email or web address] Date: 09/28/2022 Prepared by: Elease Etienne  Exercises - Sit to Stand with Armchair  - 1 x daily - 7 x weekly - 3 sets -  10 reps - Supine Bridge  - 1 x daily - 7 x weekly - 3 sets - 10 reps - Supine Single Bent Knee Fallout  - 1 x daily - 7 x weekly - 3 sets - 5 reps - Backward Walking with Counter Support  - 1 x daily - 7 x weekly - 3 sets - 10 reps - Side Stepping with Resistance at Thighs and Counter Support  - 1 x daily - 7 x weekly - 3 sets - 10 reps   GOALS: Goals reviewed with patient? Yes  SHORT TERM GOALS: Target date: 09/21/2022  Pt will be independent with initial HEP for improved strength, balance, transfers and gait. Baseline:  09/25/2022 Established, pt compliant, needs advancement. Goal status: MET  2.  Pt will improve gait velocity to at least 2.0 ft/sec for improved gait efficiency and performance at mod I level   Baseline: 1.5 ft/sec with SBQC (9/18); 1.8 ft/sec w/ SBQC (10/13) Goal status: IN PROGRESS  3.  Pt will improve normal TUG to less than or equal to 20 seconds for improved functional mobility and decreased fall risk.  Baseline: 23.6 sec with SBQC (9/18); 18.75 sec w/ SBQC (10/13) Goal status: MET  4.  Pt will improve Berg score to 33/56 for decreased fall risk  Baseline: 29/56 (9/20); 38/56 (10/13) Goal status: MET   LONG TERM GOALS: Target date: 10/26/2022  Pt will be independent with final HEP for improved strength, balance, transfers and gait. Baseline:  Goal status: INITIAL  2.  Pt will improve gait velocity to at least 2.5 ft/sec for improved gait efficiency and performance at mod I level   Baseline: 1.5 ft/sec with SBQC (9/18) Goal status: INITIAL  3.  Pt will improve normal TUG to less than or equal to 17 seconds for improved functional mobility and decreased fall risk.  Baseline: 23.6 sec with SBQC (9/18) Goal status: INITIAL  4.  Pt will improve Berg score to 40/56 for decreased fall risk  Baseline: 29/56 (9/20); 38/56 (10/13) Goal status: REVISED  5.  Pt will  improve 5 x STS to less than or equal to 18 seconds to demonstrate improved functional strength and transfer efficiency.  Baseline: 23.5 sec (9/18) Goal status: INITIAL  6.  Pt will verbalize fall prevention strategies and fall recovery strategies Baseline:  Goal status: INITIAL  ASSESSMENT:  CLINICAL IMPRESSION: Emphasis of skilled session today on addressing HEP with review and modifications to limit compensation when engaging hip musculature.  PT familiar to pt addressed pt questions regarding AFO order and ongoing POC at end of session.  Will continue to address deficits as able in order to progress towards LTGs.   OBJECTIVE IMPAIRMENTS Abnormal gait, decreased balance, decreased cognition, decreased coordination, decreased endurance, decreased mobility, difficulty walking, decreased strength, impaired perceived functional ability, increased muscle spasms, impaired sensation, impaired tone, impaired UE functional use, and pain.   ACTIVITY LIMITATIONS carrying, lifting, bending, squatting, stairs, transfers, bed mobility, bathing, dressing, and locomotion level  PARTICIPATION LIMITATIONS: cleaning, driving, shopping, and community activity  PERSONAL FACTORS Time since onset of injury/illness/exacerbation and 3+ comorbidities:    past medical history of hypertension, hyperlipidemia, right basal ganglia intracerebral hemorrhage in April 2017 with residual spastic left hemiplegia, depression, sleep apnea, anemia are also affecting patient's functional outcome.   REHAB POTENTIAL: Fair time since onset  CLINICAL DECISION MAKING: Stable/uncomplicated  EVALUATION COMPLEXITY: Moderate  PLAN: PT FREQUENCY: 2x/week  PT DURATION: 8 weeks (12 visits total)  PLANNED INTERVENTIONS: Therapeutic exercises, Therapeutic activity,  Neuromuscular re-education, Balance training, Gait training, Patient/Family education, Self Care, Joint mobilization, Stair training, Vestibular training, Canalith  repositioning, Visual/preceptual remediation/compensation, Orthotic/Fit training, DME instructions, Aquatic Therapy, Dry Needling, Cognitive remediation, Electrical stimulation, Wheelchair mobility training, Cryotherapy, Moist heat, Splintting, Taping, Manual therapy, and Re-evaluation  PLAN FOR NEXT SESSION: Modify HEP prn for LLE strength/coordination/balance, work on increasing weight shift to the L/maintaining midline with standing, LLE NMR, staggered sit to stands, any LLE strengthening but especially hip abductors, trial AFOs, tall-kneeling; pt has to reach out to [facility] and MD regarding new AFO due to 4 years since received, may want to continue addressing whether pt will re-cert vs d/c at end of POC.   Bary Richard, PT, DPT 09/28/2022, 12:46 PM

## 2022-09-30 ENCOUNTER — Ambulatory Visit: Payer: No Typology Code available for payment source | Admitting: Physical Therapy

## 2022-09-30 ENCOUNTER — Telehealth: Payer: Self-pay | Admitting: Physical Therapy

## 2022-09-30 DIAGNOSIS — R2689 Other abnormalities of gait and mobility: Secondary | ICD-10-CM

## 2022-09-30 DIAGNOSIS — Z9181 History of falling: Secondary | ICD-10-CM

## 2022-09-30 DIAGNOSIS — M6281 Muscle weakness (generalized): Secondary | ICD-10-CM

## 2022-09-30 DIAGNOSIS — I69359 Hemiplegia and hemiparesis following cerebral infarction affecting unspecified side: Secondary | ICD-10-CM | POA: Diagnosis not present

## 2022-09-30 DIAGNOSIS — R269 Unspecified abnormalities of gait and mobility: Secondary | ICD-10-CM | POA: Diagnosis not present

## 2022-09-30 DIAGNOSIS — R2681 Unsteadiness on feet: Secondary | ICD-10-CM

## 2022-09-30 NOTE — Telephone Encounter (Signed)
Dr. Letta Pate,  Abigail Campos is being treated by physical therapy for L-hemibody weakness following a CVA.  Thank you for the order for the new L AFO.   Due to her only having her current AFO for 4 years insurance is requiring further justification of why she needs a new brace at this time. Do you mind addending your most recent visit note or writing this in your note after you see her 10/01/22 to include phrases such as "patient requires a new AFO as her current brace is no longer appropriate for her due to patient weight loss and the brace is unable to be adjusted to accommodate patient change in body habitus. Pt's current brace is too short on her tibia and poorly controls her knee in stance phase, leading to frequent falls. With trial of a new SpryStep Plus the patient exhibits decreased LLE ER and improved knee stability"?  Thank you, Excell Seltzer, PT, DPT, [facility] [address] [place], [place]  [address] Phone:  [phone] Fax:  [phone]

## 2022-09-30 NOTE — Therapy (Signed)
OUTPATIENT PHYSICAL THERAPY NEURO TREATMENT   Patient Name: Abigail Campos MRN: [id number] DOB:08/31/1971, 51 y.o., female Today's Date: 09/30/2022   PCP: Paz, Jose E, MD REFERRING PROVIDER: Kirsteins, Andrew E, MD    PT End of Session - 09/30/22 1154     Visit Number 9    Number of Visits 13   with eval   Date for PT Re-Evaluation 10/26/22    Authorization Type [employer]    Progress Note Due on Visit 10    PT Start Time 1150   pt late   PT Stop Time 1230    PT Time Calculation (min) 40 min    Equipment Utilized During Treatment Gait belt;Other (comment)   L [place] AFO   Activity Tolerance Patient tolerated treatment well    Behavior During Therapy WFL for tasks assessed/performed                  Past Medical History:  Diagnosis Date   Anemia    Anxiety and depression    Back pain    Essential hypertension 10/19/2007   03-2010: metoprolol changed to bystolic (was not feeling well on it, no specific allergy or reaction)     Food allergy    Hemiplegia (HCC)    Hyperlipemia    Hypertension    Joint pain    Migraine    Neuromuscular disorder (HCC)    left sided hemiplegia - has brace for l leg and walks with a cane   Obesity    Ovarian cancer (HCC)    Prediabetes    Sleep apnea    not wearing c-pap yet   Sleep apnea    Stroke (HCC) 2017   Vitamin D deficiency    Past Surgical History:  Procedure Laterality Date   ABDOMINAL HYSTERECTOMY  11-14-07   no oophorectomy per surgical report   CESAREAN SECTION     1998,2007   INGUINAL HERNIA REPAIR     2004   Patient Active Problem List   Diagnosis Date Noted   Depression, major, single episode, severe (HCC) 07/23/2022   Vitamin D deficiency 07/06/2021   History of CVA (cerebrovascular accident) 07/06/2021   Other constipation 03/18/2021   Hot flashes 03/17/2021   Prediabetes 02/11/2021   Neuropathic pain 09/19/2020   Adjustment disorder with mixed anxiety and depressed mood 09/19/2020    Abnormality of gait 05/09/2020   Hemiplegia of nondominant side, late effect of cerebrovascular disease (HCC) 03/28/2020   Spastic hemiplegia of left nondominant side due to nontraumatic intraparenchymal hemorrhage of brain (HCC) 12/26/2019   Trigger thumb of right hand 11/02/2019   OSA (obstructive sleep apnea) 07/26/2019   Spastic hemiplegia of left nondominant side as late effect of nontraumatic intraparenchymal hemorrhage of brain (HCC) 04/06/2019   Spastic hemiparesis affecting dominant side (HCC) 01/04/2018   Hyperglycemia 12/29/2016   Muscle spasticity    Hyperlipidemia 04/08/2016   Basal ganglia hemorrhage (HCC) 04/08/2016   Dysphagia as late effect of cerebrovascular disease    Migraine with aura and without status migrainosus, not intractable    Gait disturbance, post-stroke    Hemiplegia, post-stroke (HCC)    Aphasia, post-stroke    Dysarthria, post-stroke    ICH (intracerebral hemorrhage) (HCC) - R basal ganglia due to hypertensive emergency 04/01/2016   Upper airway cough syndrome 12/06/2015   PCP NOTES >>>>>>>>>>>>>>>>>>>>>>>>>>>.. 09/25/2015   Insomnia 12/28/2012   Annual physical exam 11/23/2011   Essential hypertension 10/19/2007    ONSET DATE: 08/18/2022   REFERRING DIAG:   I69.398,R26.9 (ICD-10-CM) - Gait disturbance, post-stroke   THERAPY DIAG:  Abnormality of gait  Unsteadiness on feet  Other abnormalities of gait and mobility  Muscle weakness (generalized)  History of falling  Hemiplegia, post-stroke (HCC)  Rationale for Evaluation and Treatment Rehabilitation  SUBJECTIVE:                                                                                                                                                                                              SUBJECTIVE STATEMENT: Pt reports her new updated HEP has been going well and she was able to work on tandem gait at home which she wasn't able to perform during her last PT session due to  decreased confidence. Pt reports increased difficulty with her gait today and has noticed increased LLE hip ER, frequently stopping to correct her limb position during gait. Pt reports she called [facility] yesterday and left a message, still waiting to hear back.  Pt accompanied by: self  PERTINENT HISTORY: hypertension, hyperlipidemia, right basal ganglia intracerebral hemorrhage in April 2017 with residual spastic left hemiplegia, depression, sleep apnea, anemia   PAIN:  Are you having pain? No   No pain at rest  PRECAUTIONS: Fall  FALLS: Has patient fallen in last 6 months? Yes. Number of falls 3, see subjective eval for details  PATIENT GOALS "just be able to walk again like my new normal after my stroke"  OBJECTIVE:   TODAY'S TREATMENT:   THER EX:  Adjusted HEP, see bolded below   SELF-CARE/HOME MANAGEMENT: Reached out to [facility] during session as pt still waiting to hear back from them. Per [facility] associate pt needs more documentation from referring physician with regards to why she needs a new AFO despite it only being 4 years since she last received a new brace. Sent a Telephone Encounter to referring physician regarding documentation required to justify a new brace. Pt unable to schedule an appointment with [facility] until documentation in chart and sent over to [facility]. Updated pt on process of obtaining a new AFO and reviewed reasons why she needs a new one including: frequent falls due to ill-fitting brace due to body habitus changes since last brace received 4 years ago, decreased ER control during gait from current brace, and decreased knee stabilization from current brace.  Also discussed pt's PT POC and that she would benefit from therapy sessions once she receives her new brace to work on gait training so pending date she receives her brace will adjust PT POC to allow for these visits.   PATIENT EDUCATION: Education details: Additions/modifications to HEP, AFO  process Person educated:  Patient Education method: Explanation, Demonstration, Tactile cues, Verbal cues, and Handouts Education comprehension: verbalized understanding and needs further education   HOME EXERCISE PROGRAM: Access Code: [email or web address] URL: [email or web address] Date: 09/28/2022 Prepared by: Marissa Varnadore  Exercises - Sit to Stand with Armchair  - 1 x daily - 7 x weekly - 3 sets - 10 reps - Supine Single Bent Knee Fallout  - 1 x daily - 7 x weekly - 3 sets - 5 reps - Backward Walking with Counter Support  - 1 x daily - 7 x weekly - 3 sets - 10 reps - Side Stepping with Resistance at Thighs and Counter Support  - 1 x daily - 7 x weekly - 3 sets - 10 reps - Supine Bridge with Mini Swiss Ball Between Knees  - 1 x daily - 7 x weekly - 3 sets - 5 reps   GOALS: Goals reviewed with patient? Yes  SHORT TERM GOALS: Target date: 09/21/2022  Pt will be independent with initial HEP for improved strength, balance, transfers and gait. Baseline:  09/25/2022 Established, pt compliant, needs advancement. Goal status: MET  2.  Pt will improve gait velocity to at least 2.0 ft/sec for improved gait efficiency and performance at mod I level   Baseline: 1.5 ft/sec with SBQC (9/18); 1.8 ft/sec w/ SBQC (10/13) Goal status: IN PROGRESS  3.  Pt will improve normal TUG to less than or equal to 20 seconds for improved functional mobility and decreased fall risk.  Baseline: 23.6 sec with SBQC (9/18); 18.75 sec w/ SBQC (10/13) Goal status: MET  4.  Pt will improve Berg score to 33/56 for decreased fall risk  Baseline: 29/56 (9/20); 38/56 (10/13) Goal status: MET   LONG TERM GOALS: Target date: 10/26/2022  Pt will be independent with final HEP for improved strength, balance, transfers and gait. Baseline:  Goal status: INITIAL  2.  Pt will improve gait velocity to at least 2.5 ft/sec for improved gait efficiency and performance at mod I level   Baseline: 1.5 ft/sec with  SBQC (9/18) Goal status: INITIAL  3.  Pt will improve normal TUG to less than or equal to 17 seconds for improved functional mobility and decreased fall risk.  Baseline: 23.6 sec with SBQC (9/18) Goal status: INITIAL  4.  Pt will improve Berg score to 40/56 for decreased fall risk  Baseline: 29/56 (9/20); 38/56 (10/13) Goal status: REVISED  5.  Pt will improve 5 x STS to less than or equal to 18 seconds to demonstrate improved functional strength and transfer efficiency.  Baseline: 23.5 sec (9/18) Goal status: INITIAL  6.  Pt will verbalize fall prevention strategies and fall recovery strategies Baseline:  Goal status: INITIAL  ASSESSMENT:  CLINICAL IMPRESSION: Emphasis of skilled PT session on adjusting HEP for increased challenge and reaching out to [facility] on behalf of patient in order to determine next steps for setting up an appointment for a new L AFO. Sent Telephone Encounter to referring physician in order to gain more documentation and justification for a new brace at this time. Pt updated on steps of AFO process and reviewed plan for PT POC. Pt continues to benefit from skilled therapy services to due ongoing L hemi-body weakness leading to gait deviations and increased fall risk. Continue POC.   OBJECTIVE IMPAIRMENTS Abnormal gait, decreased balance, decreased cognition, decreased coordination, decreased endurance, decreased mobility, difficulty walking, decreased strength, impaired perceived functional ability, increased muscle spasms, impaired sensation, impaired tone, impaired UE functional use, and pain.     ACTIVITY LIMITATIONS carrying, lifting, bending, squatting, stairs, transfers, bed mobility, bathing, dressing, and locomotion level  PARTICIPATION LIMITATIONS: cleaning, driving, shopping, and community activity  PERSONAL FACTORS Time since onset of injury/illness/exacerbation and 3+ comorbidities:    past medical history of hypertension, hyperlipidemia, right  basal ganglia intracerebral hemorrhage in April 2017 with residual spastic left hemiplegia, depression, sleep apnea, anemia are also affecting patient's functional outcome.   REHAB POTENTIAL: Fair time since onset  CLINICAL DECISION MAKING: Stable/uncomplicated  EVALUATION COMPLEXITY: Moderate  PLAN: PT FREQUENCY: 2x/week  PT DURATION: 8 weeks (12 visits total)  PLANNED INTERVENTIONS: Therapeutic exercises, Therapeutic activity, Neuromuscular re-education, Balance training, Gait training, Patient/Family education, Self Care, Joint mobilization, Stair training, Vestibular training, Canalith repositioning, Visual/preceptual remediation/compensation, Orthotic/Fit training, DME instructions, Aquatic Therapy, Dry Needling, Cognitive remediation, Electrical stimulation, Wheelchair mobility training, Cryotherapy, Moist heat, Splintting, Taping, Manual therapy, and Re-evaluation  PLAN FOR NEXT SESSION: Modify HEP prn for LLE strength/coordination/balance, work on increasing weight shift to the L/maintaining midline with standing, LLE NMR, staggered sit to stands, any LLE strengthening but especially hip abductors, trial AFOs, tall-kneeling; did Dr. Raliegh Ip addend note? (Needs to be sent to [facility])   Excell Seltzer, PT, DPT, CSRS 09/30/2022, 12:30 PM

## 2022-10-01 ENCOUNTER — Encounter
Payer: No Typology Code available for payment source | Attending: Physical Medicine & Rehabilitation | Admitting: Physical Medicine & Rehabilitation

## 2022-10-01 ENCOUNTER — Encounter: Payer: Self-pay | Admitting: Physical Medicine & Rehabilitation

## 2022-10-01 VITALS — BP 155/94 | HR 73 | Ht <= 58 in | Wt 131.0 lb

## 2022-10-01 DIAGNOSIS — G8114 Spastic hemiplegia affecting left nondominant side: Secondary | ICD-10-CM | POA: Diagnosis not present

## 2022-10-01 MED ORDER — ABOBOTULINUMTOXINA 500 UNITS IM SOLR
1000.0000 [IU] | Freq: Once | INTRAMUSCULAR | Status: AC
  Administered 2022-10-01: 1000 [IU] via INTRAMUSCULAR

## 2022-10-01 MED ORDER — SODIUM CHLORIDE (PF) 0.9 % IJ SOLN
5.0000 mL | Freq: Once | INTRAMUSCULAR | Status: AC
  Administered 2022-10-01: 5 mL

## 2022-10-01 NOTE — Progress Notes (Signed)
Dysport Injection for spasticity using needle EMG guidance  Dilution: 200 Units/ml Indication: Severe spasticity which interferes with ADL,mobility and/or  hygiene and is unresponsive to medication management and other conservative care Informed consent was obtained after describing risks and benefits of the procedure with the patient. This includes bleeding, bruising, infection, excessive weakness, or medication side effects. A REMS form is on file and signed. Needle:  needle electrode Number of units per muscle Left FCR: 200 units  Left FDS: 400 units Left FDP: 300units Left FPL   100u  All injections were done after obtaining appropriate EMG activity and after negative drawback for blood. The patient tolerated the procedure well. Post procedure instructions were given. A followup appointment was made.   Will see pt back in 6 weeks, left ankle and foot tone looks ok today will reassess need for repeat tibial nerve neurolysis with phenol at that time.    

## 2022-10-01 NOTE — Patient Instructions (Signed)

## 2022-10-05 ENCOUNTER — Ambulatory Visit: Payer: No Typology Code available for payment source | Admitting: Physical Therapy

## 2022-10-05 DIAGNOSIS — Z9181 History of falling: Secondary | ICD-10-CM

## 2022-10-05 DIAGNOSIS — M6281 Muscle weakness (generalized): Secondary | ICD-10-CM | POA: Diagnosis not present

## 2022-10-05 DIAGNOSIS — R2689 Other abnormalities of gait and mobility: Secondary | ICD-10-CM | POA: Diagnosis not present

## 2022-10-05 DIAGNOSIS — R269 Unspecified abnormalities of gait and mobility: Secondary | ICD-10-CM | POA: Diagnosis not present

## 2022-10-05 DIAGNOSIS — I69359 Hemiplegia and hemiparesis following cerebral infarction affecting unspecified side: Secondary | ICD-10-CM | POA: Diagnosis not present

## 2022-10-05 DIAGNOSIS — R2681 Unsteadiness on feet: Secondary | ICD-10-CM | POA: Diagnosis not present

## 2022-10-05 NOTE — Therapy (Signed)
OUTPATIENT PHYSICAL THERAPY NEURO TREATMENT-10TH VISIT PROGRESS NOTE   Patient Name: UNNAMED Abigail Campos MRN: [id number] DOB:05/07/1971, 51 y.o., female Today's Date: 10/05/2022  Physical Therapy Progress Note   Dates of Reporting Period:08/31/2022 - 10/05/2022  See Note below for Objective Data and Assessment of Progress/Goals.  Thank you for the referral of this patient. Excell Seltzer, PT, DPT, CSRS   PCP: Colon Branch, MD REFERRING PROVIDER: Charlett Blake, MD    PT End of Session - 10/05/22 1147     Visit Number 10    Number of Visits 13   with eval   Date for PT Re-Evaluation 10/26/22    Authorization Type [employer] Medicare    Progress Note Due on Visit 10    PT Start Time 1145    PT Stop Time 1225    PT Time Calculation (min) 40 min    Equipment Utilized During Treatment Gait belt;Other (comment)   L [place] AFO   Activity Tolerance Patient tolerated treatment well    Behavior During Therapy WFL for tasks assessed/performed                   Past Medical History:  Diagnosis Date   Anemia    Anxiety and depression    Back pain    Essential hypertension 10/19/2007   03-2010: metoprolol changed to bystolic (was not feeling well on it, no specific allergy or reaction)     Food allergy    Hemiplegia (HCC)    Hyperlipemia    Hypertension    Joint pain    Migraine    Neuromuscular disorder ([place])    left sided hemiplegia - has brace for l leg and walks with a cane   Obesity    Ovarian cancer ([place])    Prediabetes    Sleep apnea    not wearing c-pap yet   Sleep apnea    Stroke ([place]) 2017   Vitamin D deficiency    Past Surgical History:  Procedure Laterality Date   ABDOMINAL HYSTERECTOMY  11-14-07   no oophorectomy per surgical report   CESAREAN SECTION     [id number]   INGUINAL HERNIA REPAIR     2004   Patient Active Problem List   Diagnosis Date Noted   Depression, major, single episode, severe ([place]) 07/23/2022   Vitamin D deficiency  07/06/2021   History of CVA (cerebrovascular accident) 07/06/2021   Other constipation 03/18/2021   Hot flashes 03/17/2021   Prediabetes 02/11/2021   Neuropathic pain 09/19/2020   Adjustment disorder with mixed anxiety and depressed mood 09/19/2020   Abnormality of gait 05/09/2020   Hemiplegia of nondominant side, late effect of cerebrovascular disease ([place]) 03/28/2020   Spastic hemiplegia of left nondominant side due to nontraumatic intraparenchymal hemorrhage of brain ([place]) 12/26/2019   Trigger thumb of right hand 11/02/2019   OSA (obstructive sleep apnea) 07/26/2019   Spastic hemiplegia of left nondominant side as late effect of nontraumatic intraparenchymal hemorrhage of brain ([place]) 04/06/2019   Spastic hemiparesis affecting dominant side ([place]) 01/04/2018   Hyperglycemia 12/29/2016   Muscle spasticity    Hyperlipidemia 04/08/2016   Basal ganglia hemorrhage ([place]) 04/08/2016   Dysphagia as late effect of cerebrovascular disease    Migraine with aura and without status migrainosus, not intractable    Gait disturbance, post-stroke    Hemiplegia, post-stroke (HCC)    Aphasia, post-stroke    Dysarthria, post-stroke    ICH (intracerebral hemorrhage) ([place]) - R basal ganglia due to hypertensive emergency  04/01/2016   Upper airway cough syndrome 12/06/2015   PCP NOTES >>>>>>>>>>>>>>>>>>>>>>>>>>>.. 09/25/2015   Insomnia 12/28/2012   Annual physical exam 11/23/2011   Essential hypertension 10/19/2007    ONSET DATE: 08/18/2022   REFERRING DIAG: D66.440,H47.4 (ICD-10-CM) - Gait disturbance, post-stroke   THERAPY DIAG:  Abnormality of gait  Unsteadiness on feet  Other abnormalities of gait and mobility  Muscle weakness (generalized)  History of falling  Hemiplegia, post-stroke (HCC)  Rationale for Evaluation and Treatment Rehabilitation  SUBJECTIVE:                                                                                                                                                                                               SUBJECTIVE STATEMENT: Pt reports no pain today, no changes since last session. Pt reports she saw Dr. Raliegh Ip last week for her Botox injection to her LUE. Pt reports that her HEP is still going well with the changes made to it last week, no questions.  Pt accompanied by: self  PERTINENT HISTORY: hypertension, hyperlipidemia, right basal ganglia intracerebral hemorrhage in April 2017 with residual spastic left hemiplegia, depression, sleep apnea, anemia   PAIN:  Are you having pain? No   No pain at rest  PRECAUTIONS: Fall  FALLS: Has patient fallen in last 6 months? Yes. Number of falls 3, see subjective eval for details  PATIENT GOALS "just be able to walk again like my new normal after my stroke"  OBJECTIVE:   TODAY'S TREATMENT:   GAIT: Gait pattern: step to pattern, decreased step length- Left, decreased stance time- Left, decreased hip/knee flexion- Left, decreased ankle dorsiflexion- Left, circumduction- Left, genu recurvatum- Left, and abducted- Left Distance walked: 200 ft Assistive device utilized: Quad cane small base Level of assistance: Modified independence Comments: with current AFO outdoors across uneven sidewalk and up/down inclines; ongoing L hip ER during gait  Gait pattern: step to pattern, decreased stride length, decreased hip/knee flexion- Left, and decreased ankle dorsiflexion- Left Distance walked: 200 ft Assistive device utilized: Quad cane small base Level of assistance: CGA Comments: with trial of L AFO Thusane Sprystep PLS again with noted decreased knee instability and decreased hip ER, pt has onset of fatigue due to distance covered as well as challenge of terrain and requires seated rest break  Discussed pt's current POC and plan to recert next visit. Will wait until pt has scheduled appointment with [facility] to schedule PT appointments beyond next scheduled visit so that future appointments can  focus on gait training with her new AFO.  PATIENT EDUCATION: Education details: continue HEP, AFO process, PT POC Person educated: Patient  Education method: Explanation, Demonstration, Tactile cues, and Verbal cues Education comprehension: verbalized understanding and needs further education   HOME EXERCISE PROGRAM: Access Code: [email or web address] URL: [email or web address] Date: 09/28/2022 Prepared by: Elease Etienne  Exercises - Sit to Stand with Armchair  - 1 x daily - 7 x weekly - 3 sets - 10 reps - Supine Single Bent Knee Fallout  - 1 x daily - 7 x weekly - 3 sets - 5 reps - Backward Walking with Counter Support  - 1 x daily - 7 x weekly - 3 sets - 10 reps - Side Stepping with Resistance at Thighs and Counter Support  - 1 x daily - 7 x weekly - 3 sets - 10 reps - Supine Bridge with Mini Swiss Ball Between Knees  - 1 x daily - 7 x weekly - 3 sets - 5 reps   GOALS: Goals reviewed with patient? Yes  SHORT TERM GOALS: Target date: 09/21/2022  Pt will be independent with initial HEP for improved strength, balance, transfers and gait. Baseline:  09/25/2022 Established, pt compliant, needs advancement. Goal status: MET  2.  Pt will improve gait velocity to at least 2.0 ft/sec for improved gait efficiency and performance at mod I level   Baseline: 1.5 ft/sec with SBQC (9/18); 1.8 ft/sec w/ SBQC (10/13) Goal status: IN PROGRESS  3.  Pt will improve normal TUG to less than or equal to 20 seconds for improved functional mobility and decreased fall risk.  Baseline: 23.6 sec with SBQC (9/18); 18.75 sec w/ SBQC (10/13) Goal status: MET  4.  Pt will improve Berg score to 33/56 for decreased fall risk  Baseline: 29/56 (9/20); 38/56 (10/13) Goal status: MET   LONG TERM GOALS: Target date: 10/26/2022  Pt will be independent with final HEP for improved strength, balance, transfers and gait. Baseline:  Goal status: INITIAL  2.  Pt will improve gait velocity to at least  2.5 ft/sec for improved gait efficiency and performance at mod I level   Baseline: 1.5 ft/sec with SBQC (9/18) Goal status: INITIAL  3.  Pt will improve normal TUG to less than or equal to 17 seconds for improved functional mobility and decreased fall risk.  Baseline: 23.6 sec with SBQC (9/18) Goal status: INITIAL  4.  Pt will improve Berg score to 40/56 for decreased fall risk  Baseline: 29/56 (9/20); 38/56 (10/13) Goal status: REVISED  5.  Pt will improve 5 x STS to less than or equal to 18 seconds to demonstrate improved functional strength and transfer efficiency.  Baseline: 23.5 sec (9/18) Goal status: INITIAL  6.  Pt will verbalize fall prevention strategies and fall recovery strategies Baseline:  Goal status: INITIAL  ASSESSMENT:  CLINICAL IMPRESSION: Emphasis of skilled PT session on assessing gait outdoors across various terrains with her current AFO and with new recommended AFO. Pt exhibits improved gait mechanics with use of LLE PLS SpryStep Thusane with decreased hip ER and improved knee stability noted. Pt does fatigue quickly with increased distance covered this date over more challenging terrain. Pt continues to benefit from skilled therapy services to address ongoing L hemibody weakness, gait impairments, and increased fall risk. Continue POC.   OBJECTIVE IMPAIRMENTS Abnormal gait, decreased balance, decreased cognition, decreased coordination, decreased endurance, decreased mobility, difficulty walking, decreased strength, impaired perceived functional ability, increased muscle spasms, impaired sensation, impaired tone, impaired UE functional use, and pain.   ACTIVITY LIMITATIONS carrying, lifting, bending, squatting, stairs, transfers, bed mobility, bathing, dressing, and locomotion level  PARTICIPATION  LIMITATIONS: cleaning, driving, shopping, and community activity  PERSONAL FACTORS Time since onset of injury/illness/exacerbation and 3+ comorbidities:    past  medical history of hypertension, hyperlipidemia, right basal ganglia intracerebral hemorrhage in April 2017 with residual spastic left hemiplegia, depression, sleep apnea, anemia are also affecting patient's functional outcome.   REHAB POTENTIAL: Fair time since onset  CLINICAL DECISION MAKING: Stable/uncomplicated  EVALUATION COMPLEXITY: Moderate  PLAN: PT FREQUENCY: 2x/week  PT DURATION: 8 weeks (12 visits total)  PLANNED INTERVENTIONS: Therapeutic exercises, Therapeutic activity, Neuromuscular re-education, Balance training, Gait training, Patient/Family education, Self Care, Joint mobilization, Stair training, Vestibular training, Canalith repositioning, Visual/preceptual remediation/compensation, Orthotic/Fit training, DME instructions, Aquatic Therapy, Dry Needling, Cognitive remediation, Electrical stimulation, Wheelchair mobility training, Cryotherapy, Moist heat, Splintting, Taping, Manual therapy, and Re-evaluation  PLAN FOR NEXT SESSION: assess LTG and recert, add more PT appointments once [facility] appt date known   Excell Seltzer, PT, DPT, CSRS 10/05/2022, 12:32 PM

## 2022-10-07 ENCOUNTER — Ambulatory Visit: Payer: No Typology Code available for payment source | Admitting: Physical Therapy

## 2022-10-08 DIAGNOSIS — Z8673 Personal history of transient ischemic attack (TIA), and cerebral infarction without residual deficits: Secondary | ICD-10-CM | POA: Diagnosis not present

## 2022-10-08 DIAGNOSIS — Z8639 Personal history of other endocrine, nutritional and metabolic disease: Secondary | ICD-10-CM | POA: Diagnosis not present

## 2022-10-08 DIAGNOSIS — E041 Nontoxic single thyroid nodule: Secondary | ICD-10-CM | POA: Diagnosis not present

## 2022-10-08 DIAGNOSIS — I10 Essential (primary) hypertension: Secondary | ICD-10-CM | POA: Diagnosis not present

## 2022-10-08 DIAGNOSIS — Z6826 Body mass index (BMI) 26.0-26.9, adult: Secondary | ICD-10-CM | POA: Diagnosis not present

## 2022-10-08 DIAGNOSIS — Z79899 Other long term (current) drug therapy: Secondary | ICD-10-CM | POA: Diagnosis not present

## 2022-10-12 ENCOUNTER — Ambulatory Visit: Payer: No Typology Code available for payment source | Admitting: Physical Therapy

## 2022-10-12 DIAGNOSIS — Z9181 History of falling: Secondary | ICD-10-CM | POA: Diagnosis not present

## 2022-10-12 DIAGNOSIS — M6281 Muscle weakness (generalized): Secondary | ICD-10-CM | POA: Diagnosis not present

## 2022-10-12 DIAGNOSIS — R2689 Other abnormalities of gait and mobility: Secondary | ICD-10-CM | POA: Diagnosis not present

## 2022-10-12 DIAGNOSIS — R2681 Unsteadiness on feet: Secondary | ICD-10-CM

## 2022-10-12 DIAGNOSIS — R269 Unspecified abnormalities of gait and mobility: Secondary | ICD-10-CM

## 2022-10-12 DIAGNOSIS — I69359 Hemiplegia and hemiparesis following cerebral infarction affecting unspecified side: Secondary | ICD-10-CM | POA: Diagnosis not present

## 2022-10-12 NOTE — Therapy (Signed)
OUTPATIENT PHYSICAL THERAPY NEURO TREATMENT   Patient Name: Abigail Campos MRN: [id number] DOB:1971/07/07, 51 y.o., female Today's Date: 10/12/2022   PCP: Colon Branch, MD REFERRING PROVIDER: Charlett Blake, MD    PT End of Session - 10/12/22 1158     Visit Number 11    Number of Visits 13   with eval   Date for PT Re-Evaluation 10/26/22    Authorization Type [employer]    Progress Note Due on Visit 20    PT Start Time 1155   pt a few min late, therapist not informed pt had arrived   PT Stop Time 1233    PT Time Calculation (min) 38 min    Equipment Utilized During Treatment Gait belt;Other (comment)   L [place] AFO   Activity Tolerance Patient tolerated treatment well    Behavior During Therapy WFL for tasks assessed/performed                    Past Medical History:  Diagnosis Date   Anemia    Anxiety and depression    Back pain    Essential hypertension 10/19/2007   03-2010: metoprolol changed to bystolic (was not feeling well on it, no specific allergy or reaction)     Food allergy    Hemiplegia (HCC)    Hyperlipemia    Hypertension    Joint pain    Migraine    Neuromuscular disorder ([place])    left sided hemiplegia - has brace for l leg and walks with a cane   Obesity    Ovarian cancer ([place])    Prediabetes    Sleep apnea    not wearing c-pap yet   Sleep apnea    Stroke ([place]) 2017   Vitamin D deficiency    Past Surgical History:  Procedure Laterality Date   ABDOMINAL HYSTERECTOMY  11-14-07   no oophorectomy per surgical report   CESAREAN SECTION     [id number]   INGUINAL HERNIA REPAIR     2004   Patient Active Problem List   Diagnosis Date Noted   Depression, major, single episode, severe ([place]) 07/23/2022   Vitamin D deficiency 07/06/2021   History of CVA (cerebrovascular accident) 07/06/2021   Other constipation 03/18/2021   Hot flashes 03/17/2021   Prediabetes 02/11/2021   Neuropathic pain 09/19/2020   Adjustment  disorder with mixed anxiety and depressed mood 09/19/2020   Abnormality of gait 05/09/2020   Hemiplegia of nondominant side, late effect of cerebrovascular disease ([place]) 03/28/2020   Spastic hemiplegia of left nondominant side due to nontraumatic intraparenchymal hemorrhage of brain ([place]) 12/26/2019   Trigger thumb of right hand 11/02/2019   OSA (obstructive sleep apnea) 07/26/2019   Spastic hemiplegia of left nondominant side as late effect of nontraumatic intraparenchymal hemorrhage of brain ([place]) 04/06/2019   Spastic hemiparesis affecting dominant side ([place]) 01/04/2018   Hyperglycemia 12/29/2016   Muscle spasticity    Hyperlipidemia 04/08/2016   Basal ganglia hemorrhage ([place]) 04/08/2016   Dysphagia as late effect of cerebrovascular disease    Migraine with aura and without status migrainosus, not intractable    Gait disturbance, post-stroke    Hemiplegia, post-stroke (HCC)    Aphasia, post-stroke    Dysarthria, post-stroke    ICH (intracerebral hemorrhage) ([place]) - R basal ganglia due to hypertensive emergency 04/01/2016   Upper airway cough syndrome 12/06/2015   PCP NOTES >>>>>>>>>>>>>>>>>>>>>>>>>>>.. 09/25/2015   Insomnia 12/28/2012   Annual physical exam 11/23/2011   Essential hypertension  10/19/2007    ONSET DATE: 08/18/2022   REFERRING DIAG: T70.017,C94.4 (ICD-10-CM) - Gait disturbance, post-stroke   THERAPY DIAG:  Abnormality of gait  Unsteadiness on feet  Other abnormalities of gait and mobility  Muscle weakness (generalized)  History of falling  Rationale for Evaluation and Treatment Rehabilitation  SUBJECTIVE:                                                                                                                                                                                              SUBJECTIVE STATEMENT: Pt reports she tried to call this therapist last Thursday and left a message with the front desk. Pt wanting to update therapist that she does  have an appointment with [facility] on 10/21/2022 for her AFO assessment. Pt reports today her LLE feels stiff and that she is having some stomach pain. Pt reports she reached out to schedule an appointment with a GI doctor this week regarding her abdominal pain. Pt states that her HEP is going great and it remains a challenge for her.  Pt accompanied by: self  PERTINENT HISTORY: hypertension, hyperlipidemia, right basal ganglia intracerebral hemorrhage in April 2017 with residual spastic left hemiplegia, depression, sleep apnea, anemia   PAIN:  Are you having pain? No   No pain at rest  PRECAUTIONS: Fall  FALLS: Has patient fallen in last 6 months? Yes. Number of falls 3, see subjective eval for details  PATIENT GOALS "just be able to walk again like my new normal after my stroke"  OBJECTIVE:   TODAY'S TREATMENT:    THER ACT:   [facility] PT Assessment - 10/12/22 1216       Ambulation/Gait   Gait velocity 32.8 ft over 21.5 sec = 1.53 ft/sec      Standardized Balance Assessment   Standardized Balance Assessment Berg Balance Test;Five Times Sit to Stand    Five times sit to stand comments  13.87   one UE support on arm of chair     Berg Balance Test   Sit to Stand Able to stand without using hands and stabilize independently    Standing Unsupported Able to stand safely 2 minutes    Sitting with Back Unsupported but Feet Supported on Floor or Stool Able to sit safely and securely 2 minutes    Stand to Sit Sits safely with minimal use of hands    Transfers Able to transfer safely, definite need of hands    Standing Unsupported with Eyes Closed Able to stand 10 seconds with supervision    Standing Unsupported with Feet Together Able to place feet together independently and stand 1  minute safely    From Standing, Reach Forward with Outstretched Arm Can reach forward >5 cm safely (2")    From Standing Position, Pick up Object from [place] to pick up shoe safely and easily    From  Standing Position, Turn to Look Behind Over each Shoulder Looks behind from both sides and weight shifts well    Turn 360 Degrees Able to turn 360 degrees safely in 4 seconds or less    Standing Unsupported, Alternately Place Feet on Step/Stool Able to complete >2 steps/needs minimal assist    Standing Unsupported, One Foot in Front Able to take small step independently and hold 30 seconds    Standing on One Leg Able to lift leg independently and hold equal to or more than 3 seconds    Total Score 45    Berg comment: 45/56, significant fall risk      Timed Up and Go Test   TUG Normal TUG    Normal TUG (seconds) 19   with SBQC            PATIENT EDUCATION: Education details: continue HEP, PT POC Person educated: Patient Education method: Explanation, Demonstration, Tactile cues, and Verbal cues Education comprehension: verbalized understanding and needs further education   HOME EXERCISE PROGRAM: Access Code: [email or web address] URL: [email or web address] Date: 09/28/2022 Prepared by: Elease Etienne  Exercises - Sit to Stand with Armchair  - 1 x daily - 7 x weekly - 3 sets - 10 reps - Supine Single Bent Knee Fallout  - 1 x daily - 7 x weekly - 3 sets - 5 reps - Backward Walking with Counter Support  - 1 x daily - 7 x weekly - 3 sets - 10 reps - Side Stepping with Resistance at Thighs and Counter Support  - 1 x daily - 7 x weekly - 3 sets - 10 reps - Supine Bridge with Mini Swiss Ball Between Knees  - 1 x daily - 7 x weekly - 3 sets - 5 reps   GOALS: Goals reviewed with patient? Yes  SHORT TERM GOALS: Target date: 09/21/2022  Pt will be independent with initial HEP for improved strength, balance, transfers and gait. Baseline:  09/25/2022 Established, pt compliant, needs advancement. Goal status: MET  2.  Pt will improve gait velocity to at least 2.0 ft/sec for improved gait efficiency and performance at mod I level   Baseline: 1.5 ft/sec with SBQC (9/18); 1.8  ft/sec w/ SBQC (10/13) Goal status: IN PROGRESS  3.  Pt will improve normal TUG to less than or equal to 20 seconds for improved functional mobility and decreased fall risk.  Baseline: 23.6 sec with SBQC (9/18); 18.75 sec w/ SBQC (10/13) Goal status: MET  4.  Pt will improve Berg score to 33/56 for decreased fall risk  Baseline: 29/56 (9/20); 38/56 (10/13) Goal status: MET   LONG TERM GOALS: Target date: 10/26/2022  Pt will be independent with final HEP for improved strength, balance, transfers and gait. Baseline:  Goal status: INITIAL  2.  Pt will improve gait velocity to at least 2.5 ft/sec for improved gait efficiency and performance at mod I level   Baseline: 1.5 ft/sec with SBQC (9/18), 1.53 ft/sec with SBQC (10/30) Goal status: NOT MET  3.  Pt will improve normal TUG to less than or equal to 17 seconds for improved functional mobility and decreased fall risk.  Baseline: 23.6 sec with SBQC (9/18), 19 sec with SBQC (10/30) Goal status: IN PROGRESS  4.  Pt will improve Berg score to 40/56 for decreased fall risk  Baseline: 29/56 (9/20); 38/56 (10/13), 45/56 (10/30) Goal status: MET  5.  Pt will improve 5 x STS to less than or equal to 18 seconds to demonstrate improved functional strength and transfer efficiency.  Baseline: 23.5 sec (9/18), 13.87 sec (10/30) Goal status: MET  6.  Pt will verbalize fall prevention strategies and fall recovery strategies Baseline:  Goal status: INITIAL  ASSESSMENT:  CLINICAL IMPRESSION: Emphasis of skilled PT session on reassessing some STG and discussing PT POC. Pt has met 2/4 STG that were assessed this date due to improved Berg score from 29/56 initially to 45/56 this date and improving her 5xSTS time from 23.5 sec initially to 13.87 sec this date. Pt has improved her time on the TUG from 23.6 sec initially to 19 sec this date and is making progress towards her goal of 17 sec. Pt has only slightly improved her gait speed from 1.5  ft/sec to 1.53 ft/sec which indicates that she is an increased fall risk. Pt continues to benefit from skilled therapy services to address ongoing gait impairments and for safe gait training with her new AFO once she receives that in order to decrease her fall risk and increase her safety and independence with functional mobility. Continue POC.   OBJECTIVE IMPAIRMENTS Abnormal gait, decreased balance, decreased cognition, decreased coordination, decreased endurance, decreased mobility, difficulty walking, decreased strength, impaired perceived functional ability, increased muscle spasms, impaired sensation, impaired tone, impaired UE functional use, and pain.   ACTIVITY LIMITATIONS carrying, lifting, bending, squatting, stairs, transfers, bed mobility, bathing, dressing, and locomotion level  PARTICIPATION LIMITATIONS: cleaning, driving, shopping, and community activity  PERSONAL FACTORS Time since onset of injury/illness/exacerbation and 3+ comorbidities:    past medical history of hypertension, hyperlipidemia, right basal ganglia intracerebral hemorrhage in April 2017 with residual spastic left hemiplegia, depression, sleep apnea, anemia are also affecting patient's functional outcome.   REHAB POTENTIAL: Fair time since onset  CLINICAL DECISION MAKING: Stable/uncomplicated  EVALUATION COMPLEXITY: Moderate  PLAN: PT FREQUENCY: 2x/week  PT DURATION: 8 weeks (12 visits total)  PLANNED INTERVENTIONS: Therapeutic exercises, Therapeutic activity, Neuromuscular re-education, Balance training, Gait training, Patient/Family education, Self Care, Joint mobilization, Stair training, Vestibular training, Canalith repositioning, Visual/preceptual remediation/compensation, Orthotic/Fit training, DME instructions, Aquatic Therapy, Dry Needling, Cognitive remediation, Electrical stimulation, Wheelchair mobility training, Cryotherapy, Moist heat, Splintting, Taping, Manual therapy, and Re-evaluation  PLAN  FOR NEXT SESSION: recert for 1x/week for 4 weeks, did she get her new AFO from [employer]? Work on Personnel officer with new AFO, fall recovery techniques   Excell Seltzer, PT, DPT, CSRS 10/12/2022, 1:05 PM

## 2022-10-17 ENCOUNTER — Emergency Department (HOSPITAL_BASED_OUTPATIENT_CLINIC_OR_DEPARTMENT_OTHER): Payer: No Typology Code available for payment source | Admitting: Radiology

## 2022-10-17 ENCOUNTER — Other Ambulatory Visit: Payer: Self-pay

## 2022-10-17 ENCOUNTER — Emergency Department (HOSPITAL_BASED_OUTPATIENT_CLINIC_OR_DEPARTMENT_OTHER): Payer: No Typology Code available for payment source

## 2022-10-17 ENCOUNTER — Encounter (HOSPITAL_BASED_OUTPATIENT_CLINIC_OR_DEPARTMENT_OTHER): Payer: Self-pay | Admitting: Emergency Medicine

## 2022-10-17 ENCOUNTER — Emergency Department (HOSPITAL_BASED_OUTPATIENT_CLINIC_OR_DEPARTMENT_OTHER)
Admission: EM | Admit: 2022-10-17 | Discharge: 2022-10-17 | Disposition: A | Discharge status: Home / Self Care | Payer: No Typology Code available for payment source | Attending: Emergency Medicine | Admitting: Emergency Medicine

## 2022-10-17 DIAGNOSIS — W01198A Fall on same level from slipping, tripping and stumbling with subsequent striking against other object, initial encounter: Secondary | ICD-10-CM | POA: Insufficient documentation

## 2022-10-17 DIAGNOSIS — Z91010 Allergy to peanuts: Secondary | ICD-10-CM | POA: Insufficient documentation

## 2022-10-17 DIAGNOSIS — M25512 Pain in left shoulder: Secondary | ICD-10-CM | POA: Diagnosis present

## 2022-10-17 DIAGNOSIS — Z7902 Long term (current) use of antithrombotics/antiplatelets: Secondary | ICD-10-CM | POA: Insufficient documentation

## 2022-10-17 DIAGNOSIS — H6123 Impacted cerumen, bilateral: Secondary | ICD-10-CM | POA: Insufficient documentation

## 2022-10-17 DIAGNOSIS — R531 Weakness: Secondary | ICD-10-CM | POA: Insufficient documentation

## 2022-10-17 DIAGNOSIS — Z91040 Latex allergy status: Secondary | ICD-10-CM | POA: Diagnosis not present

## 2022-10-17 DIAGNOSIS — S42035A Nondisplaced fracture of lateral end of left clavicle, initial encounter for closed fracture: Secondary | ICD-10-CM | POA: Diagnosis not present

## 2022-10-17 DIAGNOSIS — Y92512 Supermarket, store or market as the place of occurrence of the external cause: Secondary | ICD-10-CM | POA: Insufficient documentation

## 2022-10-17 DIAGNOSIS — W19XXXA Unspecified fall, initial encounter: Secondary | ICD-10-CM

## 2022-10-17 DIAGNOSIS — Z043 Encounter for examination and observation following other accident: Secondary | ICD-10-CM | POA: Diagnosis not present

## 2022-10-17 LAB — CBC WITH DIFFERENTIAL/PLATELET
Abs Immature Granulocytes: 0.01 10*3/uL (ref 0.00–0.07)
Basophils Absolute: 0 10*3/uL (ref 0.0–0.1)
Basophils Relative: 0 %
Eosinophils Absolute: 0 10*3/uL (ref 0.0–0.5)
Eosinophils Relative: 0 %
HCT: 41.6 % (ref 36.0–46.0)
Hemoglobin: 12.9 g/dL (ref 12.0–15.0)
Immature Granulocytes: 0 %
Lymphocytes Relative: 30 %
Lymphs Abs: 1.7 10*3/uL (ref 0.7–4.0)
MCH: 30.6 pg (ref 26.0–34.0)
MCHC: 31 g/dL (ref 30.0–36.0)
MCV: 98.8 fL (ref 80.0–100.0)
Monocytes Absolute: 0.6 10*3/uL (ref 0.1–1.0)
Monocytes Relative: 11 %
Neutro Abs: 3.3 10*3/uL (ref 1.7–7.7)
Neutrophils Relative %: 59 %
Platelets: 178 10*3/uL (ref 150–400)
RBC: 4.21 MIL/uL (ref 3.87–5.11)
RDW: 14.3 % (ref 11.5–15.5)
WBC: 5.7 10*3/uL (ref 4.0–10.5)
nRBC: 0 % (ref 0.0–0.2)

## 2022-10-17 LAB — URINALYSIS, ROUTINE W REFLEX MICROSCOPIC
Bilirubin Urine: NEGATIVE
Glucose, UA: NEGATIVE mg/dL
Hgb urine dipstick: NEGATIVE
Ketones, ur: NEGATIVE mg/dL
Leukocytes,Ua: NEGATIVE
Nitrite: NEGATIVE
Specific Gravity, Urine: 1.02 (ref 1.005–1.030)
pH: 6 (ref 5.0–8.0)

## 2022-10-17 LAB — COMPREHENSIVE METABOLIC PANEL
ALT: 31 U/L (ref 0–44)
AST: 18 U/L (ref 15–41)
Albumin: 4.1 g/dL (ref 3.5–5.0)
Alkaline Phosphatase: 83 U/L (ref 38–126)
Anion gap: 6 (ref 5–15)
BUN: 7 mg/dL (ref 6–20)
CO2: 32 mmol/L (ref 22–32)
Calcium: 9.6 mg/dL (ref 8.9–10.3)
Chloride: 100 mmol/L (ref 98–111)
Creatinine, Ser: 0.85 mg/dL (ref 0.44–1.00)
GFR, Estimated: >60 mL/min (ref >60)
Glucose, Bld: 78 mg/dL (ref 70–99)
Potassium: 3.6 mmol/L (ref 3.5–5.1)
Sodium: 138 mmol/L (ref 135–145)
Total Bilirubin: 0.5 mg/dL (ref 0.3–1.2)
Total Protein: 7.8 g/dL (ref 6.5–8.1)

## 2022-10-17 LAB — PREGNANCY, URINE: Preg Test, Ur: NEGATIVE

## 2022-10-17 LAB — TROPONIN I (HIGH SENSITIVITY): Troponin I (High Sensitivity): <2 ng/L (ref <18)

## 2022-10-17 MED ORDER — OXYCODONE HCL 5 MG PO TABS: 5.0000 mg | ORAL_TABLET | Freq: Four times a day (QID) | ORAL | 0 refills | Status: AC | PRN

## 2022-10-17 MED ORDER — OXYCODONE HCL 5 MG PO TABS
5.0000 mg | ORAL_TABLET | Freq: Once | ORAL | Status: AC
  Administered 2022-10-17: 5 mg via ORAL
  Filled 2022-10-17: qty 1

## 2022-10-17 NOTE — ED Notes (Signed)
Pt reports sorness and pain on the left side from the head down where she fell on that side.

## 2022-10-17 NOTE — ED Provider Notes (Signed)
[facility] EMERGENCY DEPT Provider Note   [name]: [id number] Arrival date & time: 10/17/22  1601     History    Abigail Campos is a 51 y.o. female, who presents to the ED secondary to a fall that occurred while at the women section at a store, states that she may have lost her balance, and falling, but is unsure.  History of a CVA with left-sided weakness.  Uses a cane to ambulate.  Reports left-sided shoulder and arm pain.  Unknown if any loss of consciousness.  Denies any chest pain, shortness of breath, weakness on one side that is new. Not on eliquis/coumadin/xarelto per pt. Does take Plavix.      Home Medications Prior to Admission medications   Medication Sig Start Date End Date Taking? Authorizing Provider  oxyCODONE (ROXICODONE) 5 MG immediate release tablet Take 1 tablet (5 mg total) by mouth every 6 (six) hours as needed for up to 3 days for severe pain. 10/17/22 10/20/22 Yes Elisheva Fallas L, PA  atorvastatin (LIPITOR) 20 MG tablet TAKE 1 TABLET BY MOUTH EVERY DAY 06/25/22   Colon Branch, MD  baclofen (LIORESAL) 10 MG tablet TAKE 3 TABLETS (30 MG TOTAL) BY MOUTH 3 (THREE) TIMES DAILY. 01/26/22   Kirsteins, Luanna Salk, MD  buPROPion (WELLBUTRIN) 100 MG tablet TAKE 1 TABLET BY MOUTH TWICE A DAY 07/30/22   Colon Branch, MD  carvedilol (COREG) 25 MG tablet Take 1 tablet (25 mg total) by mouth 2 (two) times daily with a meal. 06/02/22   Colon Branch, MD  clopidogrel (PLAVIX) 75 MG tablet TAKE 1 TABLET BY MOUTH EVERY DAY 01/26/22   Colon Branch, MD  escitalopram (LEXAPRO) 20 MG tablet TAKE 1 TABLET BY MOUTH EVERY DAY 07/30/22   Colon Branch, MD  losartan (COZAAR) 100 MG tablet Take 1 tablet (100 mg total) by mouth daily. 09/21/22   Colon Branch, MD  traZODone (DESYREL) 100 MG tablet Take 1 tablet (100 mg total) by mouth at bedtime as needed for sleep. 09/21/22   Colon Branch, MD      Allergies    Bee venom, Peanut-containing drug products, Aspirin, Isovue [iopamidol], Latex, Ambien  [zolpidem tartrate], and Hydrocodone    Review of Systems   Review of Systems  Musculoskeletal:  Negative for neck pain.       +head pain, L shoulder pain, L humerus pain    Physical Exam Updated Vital Signs BP (!) 157/115   Pulse 76   Temp 98.6 F (37 C)   Resp 16   SpO2 97%  Physical Exam Vitals and nursing note reviewed.  Constitutional:      General: She is not in acute distress.    Appearance: She is well-developed.  HENT:     Head: Normocephalic and atraumatic. No raccoon eyes, Battle's sign, contusion, right periorbital erythema, left periorbital erythema or laceration.     Comments: TTP posterior L ear, no ecchymoses or crepitus    Right Ear: There is impacted cerumen.     Left Ear: There is impacted cerumen.     Nose:     Right Nostril: No septal hematoma.     Left Nostril: No septal hematoma.     Mouth/Throat:     Mouth: Mucous membranes are moist. No injury or lacerations.  Eyes:     Extraocular Movements: Extraocular movements intact.     Conjunctiva/sclera: Conjunctivae normal.  Cardiovascular:     Rate and Rhythm: Normal rate and  regular rhythm.     Heart sounds: No murmur heard. Pulmonary:     Effort: Pulmonary effort is normal. No respiratory distress.     Breath sounds: Normal breath sounds.  Chest:     Chest wall: No tenderness or crepitus.  Abdominal:     Palpations: Abdomen is soft.     Tenderness: There is no abdominal tenderness.  Musculoskeletal:        General: No swelling.     Cervical back: Normal range of motion and neck supple. No signs of trauma. No pain with movement or muscular tenderness.     Comments: Left sided weakness--per pt chronic. 1/5 of LUE, 4/5 of LLE. 5/5 of RUE+RLE. TTP of L clavicle, superior scapula nad mid shaft of left humerus. No stepoffs. No boney ttp of radius/ulna, chest wall, abdomen, or wrist/hand.   Skin:    General: Skin is warm and dry.     Capillary Refill: Capillary refill takes less than 2 seconds.   Neurological:     Mental Status: She is alert.  Psychiatric:        Mood and Affect: Mood normal.     ED Results / Procedures / Treatments   Labs (all labs ordered are listed, but only abnormal results are displayed) Labs Reviewed  URINALYSIS, ROUTINE W REFLEX MICROSCOPIC - Abnormal; Notable for the following components:      Result Value   APPearance HAZY (*)    Protein, ur TRACE (*)    Bacteria, UA RARE (*)    All other components within normal limits  CBC WITH DIFFERENTIAL/PLATELET  COMPREHENSIVE METABOLIC PANEL  PREGNANCY, URINE  POC URINE PREG, ED  TROPONIN I (HIGH SENSITIVITY)    EKG EKG Interpretation  Date/Time:  Saturday October 17 2022 18:09:39 EDT Ventricular Rate:  66 PR Interval:  162 QRS Duration: 83 QT Interval:  400 QTC Calculation: 420 R Axis:   -17 Text Interpretation: Sinus rhythm Borderline left axis deviation No significant change since prior 1/23 Confirmed by Aletta Edouard [phone]) on 10/17/2022 6:24:32 PM  Radiology DG Shoulder Left  Result Date: 10/17/2022 CLINICAL DATA:  51 year old female presenting for evaluation of shoulder dislocation. EXAM: LEFT SHOULDER - 2+ VIEW COMPARISON:  November 01, 2021 and scapular images from October 17, 2022. FINDINGS: Mild irregularity of the inferior glenoid, this is not changed. Glenohumeral degenerative changes. Inferior subluxation of the LEFT humeral head is similar to previous imaging. No acute bony abnormality. IMPRESSION: Inferior subluxation of the LEFT humeral head as on previous imaging without acute fracture or dislocation. Potential evidence of prior bony [place] injury with glenohumeral degenerative changes. Electronically Signed   By: Zetta Bills M.D.   On: 10/17/2022 19:37   CT Head Wo Contrast  Result Date: 10/17/2022 CLINICAL DATA:  Fall EXAM: CT HEAD WITHOUT CONTRAST TECHNIQUE: Contiguous axial images were obtained from the base of the skull through the vertex without intravenous contrast.  RADIATION DOSE REDUCTION: This exam was performed according to the departmental dose-optimization program which includes automated exposure control, adjustment of the mA and/or kV according to patient size and/or use of iterative reconstruction technique. COMPARISON:  MRI brain dated 06/30/2021 FINDINGS: Brain: No evidence of acute infarction, hemorrhage, hydrocephalus, extra-axial collection or mass lesion/mass effect. Encephalomalacic changes with volume loss related to prior right frontoparietal infarct. Associated ex vacuo dilatation of the right lateral ventricle. Subcortical white matter and periventricular Sanjay Broadfoot vessel ischemic changes. Vascular: Mild intracranial atherosclerosis. Skull: Normal. Negative for fracture or focal lesion. Sinuses/Orbits: The visualized paranasal sinuses  are essentially clear. The mastoid air cells are unopacified. Other: None. IMPRESSION: No evidence of acute intracranial abnormality. Old right frontoparietal infarct. Audie Wieser vessel ischemic changes. Electronically Signed   By: Julian Hy M.D.   On: 10/17/2022 18:08   DG Humerus Left  Result Date: 10/17/2022 CLINICAL DATA:  Left arm pain following a fall. EXAM: LEFT HUMERUS - 2+ VIEW COMPARISON:  Left clavicle radiographs dated 02/27/2022. Left scapula radiographs obtained today. FINDINGS: The left humerus remains inferiorly subluxed relative to the glenoid, without significant change since 02/27/2022. Mild inferior glenoid spur formation with no visible fracture. No humerus fracture seen. Interval healing of the previously demonstrated distal left clavicle fracture. IMPRESSION: 1. Inferior subluxation of the left humerus, without significant change since 02/27/2022. 2. No fracture. Electronically Signed   By: Claudie Revering M.D.   On: 10/17/2022 18:07   DG Chest 1 View  Result Date: 10/17/2022 CLINICAL DATA:  Fall EXAM: CHEST  1 VIEW COMPARISON:  11/01/2021 FINDINGS: Lungs are clear.  No pleural effusion or  pneumothorax. The heart is top-normal in size. Suspected anterior left shoulder dislocation, correlate with dedicated radiographs. Nondisplaced left lateral clavicle fracture. IMPRESSION: No evidence of acute cardiopulmonary disease. Suspected anterior left shoulder dislocation, correlate with dedicated radiographs. Nondisplaced left lateral clavicle fracture. Electronically Signed   By: Julian Hy M.D.   On: 10/17/2022 18:05   DG Scapula Left  Result Date: 10/17/2022 CLINICAL DATA:  Shoulder pain.  Lost balance leading to fall. EXAM: LEFT SCAPULA - 2+ VIEWS COMPARISON:  Clavicle radiograph 02/27/2022 FINDINGS: The bones are under mineralized. There is no evidence of scapular fracture. The previous distal clavicle fracture has interval healing. No fracture of included left ribs. IMPRESSION: 1. No evidence of scapular fracture. 2. Interval healing of previous distal clavicle fracture. Electronically Signed   By: Keith Rake M.D.   On: 10/17/2022 18:04    Procedures Procedures    Medications Ordered in ED Medications  oxyCODONE (Oxy IR/ROXICODONE) immediate release tablet 5 mg (5 mg Oral Given 10/17/22 2007)    ED Course/ Medical Decision Making/ A&P                           Medical Decision Making Here for fall that occurred about an hour ago.  She does not know if she lost any consciousness, but did strike her head.  Does take Plavix.  Complaint of left shoulder, left arm pain.  No tenderness to palpation of the neck.  She does have hemiparesis of the left upper extremity secondary to an old stroke.  And then reduced sensation/prodrug in the left foot secondary to the old stroke.  She denies any no new neuro symptoms, states her strength is baseline for her.  Will obtain CT head, shoulder, humerus, chest for further evaluation.  She does have tenderness to palpation of her distal clavicle.  No step-offs noted.  Amount and/or Complexity of Data Reviewed Labs: ordered.    Details:  Labs fairly within normal limits Radiology: ordered.    Details: CT head shows no acute findings.  Found to have distal clavicle fracture, mild left shoulder subluxation. Discussion of management or test interpretation with external provider(s): Will place in sling secondary to distal clavicle fracture.  He is able to ambulate as normally she does.  No acute findings found.  Sent home with oxycodone for pain control secondary to clavicle fracture and informed need for follow-up with orthopedics.  Discussed not lifting without arm,  which she states she will do as she has hemiparesis on that side.  Return precautions, patient voiced understanding.  Risk Prescription drug management.    Final Clinical Impression(s) / ED Diagnoses Final diagnoses:  Fall, initial encounter  Closed nondisplaced fracture of acromial end of left clavicle, initial encounter    Rx / DC Orders ED Discharge Orders          Ordered    oxyCODONE (ROXICODONE) 5 MG immediate release tablet  Every 6 hours PRN        10/17/22 1950              Rhyanna Sorce, Si Gaul, PA 10/17/22 2202    Hayden Rasmussen, MD 10/18/22 [phone]

## 2022-10-17 NOTE — ED Triage Notes (Signed)
[place] balance [place] [employer], hitting her left side including head,shoulder and arm. Pt states left side of face/head hurt.

## 2022-10-17 NOTE — Discharge Instructions (Signed)
Please follow-up with orthopedics for your clavicle fracture.  Wear your sling at all times do not lift anything with that arm.  Take oxycodone as needed for severe pain.  Make sure you are taking stool softener with this as well to prevent constipation.  Do not use oxycodone while driving.  Return to the ER if you develop numbness, tingling, severe weakness on one side of your body, or changes in color of your skin on left side.

## 2022-10-18 ENCOUNTER — Other Ambulatory Visit: Payer: Self-pay | Admitting: Internal Medicine

## 2022-10-28 ENCOUNTER — Ambulatory Visit: Payer: No Typology Code available for payment source | Admitting: Physical Therapy

## 2022-11-04 ENCOUNTER — Ambulatory Visit: Payer: Self-pay | Admitting: Physical Therapy

## 2022-11-11 ENCOUNTER — Ambulatory Visit: Payer: No Typology Code available for payment source | Admitting: Physical Therapy

## 2022-11-11 ENCOUNTER — Ambulatory Visit: Payer: Self-pay | Admitting: Physical Therapy

## 2022-11-12 ENCOUNTER — Other Ambulatory Visit: Payer: Self-pay | Admitting: Internal Medicine

## 2022-11-12 MED ORDER — LOSARTAN POTASSIUM 100 MG PO TABS: 100.0000 mg | ORAL_TABLET | Freq: Every day | ORAL | 1 refills | Status: DC

## 2022-11-19 ENCOUNTER — Encounter: Payer: Self-pay | Admitting: Physical Medicine & Rehabilitation

## 2022-11-19 ENCOUNTER — Encounter
Payer: No Typology Code available for payment source | Attending: Physical Medicine & Rehabilitation | Admitting: Physical Medicine & Rehabilitation

## 2022-11-19 VITALS — BP 118/82 | HR 96 | Ht <= 58 in | Wt 120.8 lb

## 2022-11-19 DIAGNOSIS — G8114 Spastic hemiplegia affecting left nondominant side: Secondary | ICD-10-CM | POA: Diagnosis not present

## 2022-11-19 NOTE — Progress Notes (Signed)
Subjective:    Patient ID: Abigail Campos, female    DOB: Jun 04, 1971, 51 y.o.   MRN: [id number]  HPI  51 year old female with history of chronic left spastic hemiplegia due to right basal ganglia hemorrhage in April 2017.  She initially followed with Dr. Posey Pronto and had been receiving Botox injections.  She was switched to Dysport injections up to 1500 units.  This had some beneficial effect for the left upper limb however left lower limb continued with fairly severe spasticity.  She was therefore switched to phenol injection left tibial starting December 19, 2021.  Patient had improved results with left tibial nerve neurolysis with phenol had repeat injection 03/19/2022 as well as 06/26/2022.  On 10/01/2022 she underwent Dysport injection of the following muscles Left FCR 200 units Left FDS 100 units Left FDP 300 units Left FPL 100 units  Fell on 10/17/2022 x-rays negative has continued left shoulder pain follows with orthopedics Pain Inventory Average Pain 7 Pain Right Now 7 My pain is burning and aching  In the last 24 hours, has pain interfered with the following? General activity 7 Relation with others 7 Enjoyment of life 7 What TIME of day is your pain at its worst? varies Sleep (in general) Poor  Pain is worse with: walking, standing, and some activites Pain improves with: rest and injections Relief from Meds: 8  Family History  Problem Relation Age of Onset   Diabetes Mother    Hypertension Mother    Glaucoma Mother    Colon cancer Mother        mother age 39   Cancer Mother    Anxiety disorder Mother    Obesity Mother    Rectal cancer Father        dx 42   Hypertension Father    Cancer Father    Sleep apnea Father    Alcoholism Father    Obesity Father    Stroke Paternal Grandfather    Stroke Maternal Grandfather    Diabetes Brother    Hypertension Brother    Heart disease Maternal Aunt        x 2 did 2012 CAD-CHF   Diabetes Brother    Hypertension Brother     Lung cancer Maternal Aunt    Breast cancer Neg Hx    Esophageal cancer Neg Hx    Stomach cancer Neg Hx    Social History   Socioeconomic History   Marital status: Married    Spouse name: [name]   Number of children: 2   Years of education: Not on file   Highest education level: Not on file  Occupational History   Occupation: disable d/t stroke TEACHER    Employer: [employer]  Tobacco Use   Smoking status: Never   Smokeless tobacco: Never  Vaping Use   Vaping Use: Never used  Substance and Sexual Activity   Alcohol use: No   Drug use: No   Sexual activity: Not Currently    Birth control/protection: Surgical  Other Topics Concern   Not on file  Social History Narrative   Lives with husband and son (2007)   Left Hand prior to stroke   Drinks 1-2 cups daily   Daughter (1998), married    Social Determinants of Health   Financial Resource Strain: Low Risk  (04/17/2022)   Overall Financial Resource Strain (CARDIA)    Difficulty of Paying Living Expenses: Not hard at all  Food Insecurity: No Food Insecurity (04/17/2022)   Hunger Vital  Sign    Worried About Charity fundraiser in the Last Year: Never true    [place] in the Last Year: Never true  Transportation Needs: No Transportation Needs (04/17/2022)   PRAPARE - Hydrologist (Medical): No    Lack of Transportation (Non-Medical): No  Physical Activity: Sufficiently Active (04/17/2022)   Exercise Vital Sign    Days of Exercise per Week: 7 days    Minutes of Exercise per Session: 30 min  Stress: No Stress Concern Present (04/17/2022)   [place]    Feeling of Stress : Only a little  Social Connections: Moderately Integrated (04/17/2022)   Social Connection and Isolation Panel [NHANES]    Frequency of Communication with Friends and Family: More than three times a week    Frequency of Social Gatherings with Friends and Family:  More than three times a week    Attends Religious Services: More than 4 times per year    Active Member of Clubs or Organizations: No    Attends Archivist Meetings: Never    Marital Status: Married   Past Surgical History:  Procedure Laterality Date   ABDOMINAL HYSTERECTOMY  11-14-07   no oophorectomy per surgical report   CESAREAN SECTION     [phone]   INGUINAL HERNIA REPAIR     2004   Past Surgical History:  Procedure Laterality Date   ABDOMINAL HYSTERECTOMY  11-14-07   no oophorectomy per surgical report   CESAREAN SECTION     [phone]   INGUINAL HERNIA REPAIR     2004   Past Medical History:  Diagnosis Date   Anemia    Anxiety and depression    Back pain    Essential hypertension 10/19/2007   03-2010: metoprolol changed to bystolic (was not feeling well on it, no specific allergy or reaction)     Food allergy    Hemiplegia (HCC)    Hyperlipemia    Hypertension    Joint pain    Migraine    Neuromuscular disorder ([place])    left sided hemiplegia - has brace for l leg and walks with a cane   Obesity    Ovarian cancer ([place])    Prediabetes    Sleep apnea    not wearing c-pap yet   Sleep apnea    Stroke ([place]) 2017   Vitamin D deficiency    BP 118/82   Pulse 96   Ht '4\' 10"'$  (1.473 m)   Wt 120 lb 12.8 oz (54.8 kg)   SpO2 92%   BMI 25.25 kg/m   Opioid Risk Score:   Fall Risk Score:  `1  Depression screen PHQ 2/9     10/01/2022   12:06 PM 08/19/2022    1:09 PM 06/26/2022   11:10 AM 04/17/2022    8:20 AM 04/01/2022    1:16 PM 03/19/2022    1:13 PM 12/19/2021   12:40 PM  Depression screen PHQ 2/9  Decreased Interest 0 0 0 0 0 0 1  Down, Depressed, Hopeless 0 0 0 0 0 0 1  PHQ - 2 Score 0 0 0 0 0 0 2  Altered sleeping  3   3    Tired, decreased energy  1   1    Change in appetite  0   0    Feeling bad or failure about yourself   1   1  Trouble concentrating  3   3    Moving slowly or fidgety/restless  0   0    Suicidal thoughts  0   0    PHQ-9  Score  8   8       Review of Systems  Musculoskeletal:        Left lower leg pain Foot pain  All other systems reviewed and are negative.      Objective:   Physical Exam   Tone Left upper extremity elbow flexor not tested secondary to recent fall with left shoulder injury with x-rays negative Left wrist flexors MAS 3 Left finger flexors MAS 3 Left thumb flexor MAS 3 Left lower extremity No evidence of clonus at the ankle With standing patient has toe flexion of all 5 toes.  When taking a step inversion of the foot and ankle are noted. Motor strength could not test left upper extremity proximally 0-5 strength at the hand Left lower extremity 3 - hip flexion and knee extension 0 ankle dorsiflexion plantarflexion inversion eversion. Sensation absent in the left upper and left lower limb Mood and affect appropriate Speech with mild word finding deficits    Assessment & Plan:   #1 history of right basal ganglia hemorrhage with left spastic hemiplegia. Left lower extremity tone has been managed with tibial nerve block with phenol.  Will repeat next available appointment Left upper extremity spasticity still at MAS 3 despite Dysport injection we will switch to Xeomin to see if we can get a better effect. Plan to repeat botulinum toxin injection in January 200 units total Left FCR 50 units Left FDS 50 units Left FDP 50 units Left FPL 50 units

## 2022-11-26 ENCOUNTER — Other Ambulatory Visit: Payer: Self-pay | Admitting: Internal Medicine

## 2022-12-08 ENCOUNTER — Other Ambulatory Visit: Payer: Self-pay | Admitting: Internal Medicine

## 2022-12-08 ENCOUNTER — Other Ambulatory Visit: Payer: Self-pay | Admitting: Physical Medicine & Rehabilitation

## 2022-12-09 MED ORDER — LOSARTAN POTASSIUM 100 MG PO TABS: 100.0000 mg | ORAL_TABLET | Freq: Every day | ORAL | 1 refills | Status: DC

## 2022-12-10 ENCOUNTER — Encounter: Payer: No Typology Code available for payment source | Admitting: Physical Medicine & Rehabilitation

## 2022-12-11 ENCOUNTER — Encounter: Payer: Self-pay | Admitting: Physical Medicine & Rehabilitation

## 2022-12-11 ENCOUNTER — Encounter (HOSPITAL_BASED_OUTPATIENT_CLINIC_OR_DEPARTMENT_OTHER): Payer: No Typology Code available for payment source | Admitting: Physical Medicine & Rehabilitation

## 2022-12-11 VITALS — BP 125/81 | HR 69 | Ht <= 58 in | Wt 125.0 lb

## 2022-12-11 DIAGNOSIS — G8114 Spastic hemiplegia affecting left nondominant side: Secondary | ICD-10-CM

## 2022-12-11 MED ORDER — PHENOL 5% (AQUEOUS) FOR INJECTION
4.0000 mL | Freq: Once | Status: AC
  Administered 2022-12-11: 4 mL

## 2022-12-11 NOTE — Patient Instructions (Signed)
Tibial nerve block with phenol today. This medication may start taking the fact today however full effect will be at about one week Duration of the effect is 3-6 months Side effects of medication may include right heel numbness or burning. Call if you have burning pain so we can recommend any medication for that. 

## 2022-12-11 NOTE — Progress Notes (Signed)
Phenol neurolysis of the LEFT tibial nerve,   Indication: Severe spasticity in the plantar flexor muscles which is not responding to medical management and other conservative care and interfering with functional use.  Informed consent was obtained after describing the risks and benefits of the procedure with the patient this includes bleeding bruising and infection as well as medication side effects. The patient elected to proceed and has given written consent.In seated position Left FPL identified with estim .55m 5% phenol injected with 22 g needle electrode  Patient placed in a prone position on the exam table. External DC stimulation was applied to the popliteal space using a nerve stimulator. Plantar flexion twitch was obtained. The popliteal region was prepped with Betadine and then entered with a 22-gauge 40 mm needle electrode under electrical stimulation guidance. Plantar flexion which was obtained and confirmed. Then 4 cc of 5% phenol were injected. The patient tolerated procedure well. Post procedure instructions and followup visit were given.

## 2022-12-15 ENCOUNTER — Encounter: Payer: Self-pay | Admitting: Internal Medicine

## 2022-12-15 ENCOUNTER — Ambulatory Visit (INDEPENDENT_AMBULATORY_CARE_PROVIDER_SITE_OTHER): Payer: No Typology Code available for payment source | Admitting: Internal Medicine

## 2022-12-15 VITALS — BP 128/80 | HR 103 | Temp 98.2°F | Resp 18 | Ht <= 58 in | Wt 125.0 lb

## 2022-12-15 DIAGNOSIS — U071 COVID-19: Secondary | ICD-10-CM | POA: Diagnosis not present

## 2022-12-15 LAB — POC COVID19 BINAXNOW: SARS Coronavirus 2 Ag: POSITIVE — AB

## 2022-12-15 MED ORDER — MOLNUPIRAVIR EUA 200MG CAPSULE: 4.0000 | ORAL_CAPSULE | Freq: Two times a day (BID) | ORAL | 0 refills | Status: AC

## 2022-12-15 NOTE — Progress Notes (Signed)
Subjective:    Patient ID: Abigail Campos, female    DOB: [birth year], 52 y.o.   MRN: [id number]  DOS:  12/15/2022 Type of visit - description: Acute  Symptoms started 3 days ago: Cough, clear sputum production, for the last 2 days he had a temp as high as 102.4.  When asked admits to some sinus congestion and nasal discharge. Chest pain only with cough. Some shortness of breath with cough as well. No nausea vomiting Some myalgias.  Review of Systems See above   Past Medical History:  Diagnosis Date   Anemia    Anxiety and depression    Back pain    Essential hypertension 10/19/2007   03-2010: metoprolol changed to bystolic (was not feeling well on it, no specific allergy or reaction)     Food allergy    Hemiplegia (HCC)    Hyperlipemia    Hypertension    Joint pain    Migraine    Neuromuscular disorder ([place])    left sided hemiplegia - has brace for l leg and walks with a cane   Obesity    Ovarian cancer ([place])    Prediabetes    Sleep apnea    not wearing c-pap yet   Sleep apnea    Stroke ([place]) 2017   Vitamin D deficiency     Past Surgical History:  Procedure Laterality Date   ABDOMINAL HYSTERECTOMY  11-14-07   no oophorectomy per surgical report   CESAREAN SECTION     [id number]   INGUINAL HERNIA REPAIR     2004    Current Outpatient Medications  Medication Instructions   atorvastatin (LIPITOR) 20 mg, Oral, Daily   baclofen (LIORESAL) 30 mg, Oral, 3 times daily   buPROPion (WELLBUTRIN) 100 MG tablet TAKE 1 TABLET BY MOUTH TWICE A DAY   carvedilol (COREG) 25 MG tablet TAKE 1 TABLET (25 MG TOTAL) BY MOUTH TWICE A DAY WITH MEALS   clopidogrel (PLAVIX) 75 MG tablet TAKE 1 TABLET BY MOUTH EVERY DAY   escitalopram (LEXAPRO) 20 MG tablet TAKE 1 TABLET BY MOUTH EVERY DAY   losartan (COZAAR) 100 mg, Oral, Daily   traZODone (DESYREL) 100 mg, Oral, At bedtime PRN       Objective:   Physical Exam BP 128/80   Pulse (!) 103   Temp 98.2 F (36.8 C) (Oral)    Resp 18   Ht '4\' 10"'$  (1.473 m)   Wt 125 lb (56.7 kg)   SpO2 97%   BMI 26.13 kg/m  General:   Well developed, NAD, BMI noted. HEENT:  Normocephalic . Face symmetric, atraumatic. Throat: Symmetric, no red, no white patches Lungs:  Large airway congestion, no wheezing, no crackles. Normal respiratory effort, no intercostal retractions, no accessory muscle use. Heart: RRR,  no murmur.  Lower extremities: no pretibial edema bilaterally  Skin: Not pale. Not jaundice Neurologic:  alert & oriented X3.  Speech normal, neurological exam at baseline with L hemiplegia Psych--  Cognition and judgment appear intact.  Cooperative with normal attention span and concentration.  Behavior appropriate. No anxious or depressed appearing.      Assessment     Assessment   Prediabetes: A1c 6.0 (April 2017)  HTN Hyperlipidemia Depression, insomnia GERD Stroke 03-2016: Sequela (L)  spastic hemiplegia   ICH, right basilar ganglia due to HTN emergency, + encephalopathy, + dysphagia, aphasia, dysarthria Residual L spasticity.  CTA neck 03-2016 neg CTA head 10-17 (-) aneurysm Headaches , migraines dx after 2nd pregnancy, (-) CT  head 2014 and 2015 Cough, persisting, saw Dr. Melvyn Novas 11-2015 OSA: Sleep study 08-2019, saw Dr. Annamaria Boots, intolerant to CPAP  COVID infection 01-2021   PLAN COVID infection Symptoms started 3 days ago, had one COVID test negative at home. Flu test not available at the office today, but COVID test was + Last covid vax 09/2021, nontoxic-appearing. Plan: Rest, fluids, Tylenol, Robitussin.  See AVS Call if not gradually better, ER if symptoms severe Isolate for the next 5 days Reschedule CPX at her convenience.

## 2022-12-15 NOTE — Assessment & Plan Note (Signed)
COVID infection Symptoms started 3 days ago, had one COVID test negative at home. Flu test not available at the office today, but COVID test was + Last covid vax 09/2021, nontoxic-appearing. Plan: Rest, fluids, Tylenol, Robitussin.  See AVS Call if not gradually better, ER if symptoms severe Isolate for the next 5 days Reschedule CPX at her convenience.

## 2022-12-15 NOTE — Patient Instructions (Addendum)
You tested positive for COVID  Drink plenty of fluids  Robitussin DM for cough  Take Tylenol as needed  Take the antiviral called molnupiravir  Call if not gradually better  Go to the ER if: Severe symptoms, fever, chest pain, difficulty breathing, major headache.  Isolate for 5 days  Reschedule physical exam

## 2022-12-22 ENCOUNTER — Encounter: Payer: Self-pay | Admitting: Internal Medicine

## 2022-12-22 MED ORDER — BENZONATATE 200 MG PO CAPS: 200.0000 mg | ORAL_CAPSULE | Freq: Three times a day (TID) | ORAL | 0 refills | Status: DC | PRN

## 2023-01-05 ENCOUNTER — Ambulatory Visit (INDEPENDENT_AMBULATORY_CARE_PROVIDER_SITE_OTHER): Payer: No Typology Code available for payment source | Admitting: Internal Medicine

## 2023-01-05 ENCOUNTER — Telehealth: Payer: Self-pay | Admitting: Internal Medicine

## 2023-01-05 ENCOUNTER — Encounter: Payer: Self-pay | Admitting: Internal Medicine

## 2023-01-05 VITALS — BP 136/84 | HR 79 | Temp 98.3°F | Resp 16 | Ht <= 58 in | Wt 124.3 lb

## 2023-01-05 DIAGNOSIS — I10 Essential (primary) hypertension: Secondary | ICD-10-CM

## 2023-01-05 DIAGNOSIS — R739 Hyperglycemia, unspecified: Secondary | ICD-10-CM

## 2023-01-05 DIAGNOSIS — E785 Hyperlipidemia, unspecified: Secondary | ICD-10-CM | POA: Diagnosis not present

## 2023-01-05 DIAGNOSIS — Z01419 Encounter for gynecological examination (general) (routine) without abnormal findings: Secondary | ICD-10-CM

## 2023-01-05 DIAGNOSIS — R058 Other specified cough: Secondary | ICD-10-CM | POA: Diagnosis not present

## 2023-01-05 DIAGNOSIS — Z0000 Encounter for general adult medical examination without abnormal findings: Secondary | ICD-10-CM | POA: Diagnosis not present

## 2023-01-05 MED ORDER — ALBUTEROL SULFATE HFA 108 (90 BASE) MCG/ACT IN AERS: 2.0000 | INHALATION_SPRAY | Freq: Four times a day (QID) | RESPIRATORY_TRACT | 1 refills | Status: DC | PRN

## 2023-01-05 MED ORDER — AZITHROMYCIN 250 MG PO TABS: ORAL_TABLET | ORAL | 0 refills | Status: DC

## 2023-01-05 NOTE — Telephone Encounter (Signed)
Just an FYI- changed albuterol to levalbuterol which insurance will cover.

## 2023-01-05 NOTE — Patient Instructions (Signed)
For cough: Continue Robitussin DM OTC Start taking antibiotic called Zithromax If you feel chest congestion or wheezing : 2 puffs of albuterol every 6 hours as needed Call if not gradually better  GO TO THE LAB : Get the blood work     [place], [place] back for a checkup in 6 months

## 2023-01-05 NOTE — Progress Notes (Signed)
Subjective:    Patient ID: Abigail Campos, female    DOB: 02/27/1971, 52 y.o.   MRN: [id number]  DOS:  01/05/2023 Type of visit - description: CPX  Here for CPX Was diagnosed with COVID 12-15-2022, overall improved feels well but has a persistent cough. Initially with clear sputum, now associated with yellowish sputum and some chest congestion. No fever or chills. Some bronchospasm noted per pt  She did report some vaginal dryness and ongoing hot flashes. Denies any vaginal bleeding or discharge.  Wt Readings from Last 3 Encounters:  01/05/23 124 lb 5 oz (56.4 kg)  12/15/22 125 lb (56.7 kg)  12/11/22 125 lb (56.7 kg)     Review of Systems  Other than above, a 14 point review of systems is negative     Past Medical History:  Diagnosis Date   Anemia    Anxiety and depression    Back pain    Essential hypertension 10/19/2007   03-2010: metoprolol changed to bystolic (was not feeling well on it, no specific allergy or reaction)     Food allergy    Hemiplegia (HCC)    Hyperlipemia    Hypertension    Joint pain    Migraine    Neuromuscular disorder ([place])    left sided hemiplegia - has brace for l leg and walks with a cane   Obesity    Ovarian cancer ([place])    Prediabetes    Sleep apnea    not wearing c-pap yet   Sleep apnea    Stroke ([place]) 2017   Vitamin D deficiency     Past Surgical History:  Procedure Laterality Date   ABDOMINAL HYSTERECTOMY  11-14-07   no oophorectomy per surgical report   CESAREAN SECTION     [id number]   INGUINAL HERNIA REPAIR     2004   Social History   Social History Narrative   Lives with husband and son (2007)   Left Hand prior to stroke   Drinks 1-2 cups daily   Daughter (1998), married    Son 2008   Social History   Tobacco Use   Smoking status: Never   Smokeless tobacco: Never  Vaping Use   Vaping Use: Never used  Substance Use Topics   Alcohol use: No   Drug use: No    Current Outpatient Medications   Medication Instructions   atorvastatin (LIPITOR) 20 mg, Oral, Daily   azithromycin (ZITHROMAX Z-PAK) 250 MG tablet 2 tabs a day the first day, then 1 tab a day x 4 days   baclofen (LIORESAL) 30 mg, Oral, 3 times daily   buPROPion (WELLBUTRIN) 100 MG tablet TAKE 1 TABLET BY MOUTH TWICE A DAY   carvedilol (COREG) 25 MG tablet TAKE 1 TABLET (25 MG TOTAL) BY MOUTH TWICE A DAY WITH MEALS   clopidogrel (PLAVIX) 75 MG tablet TAKE 1 TABLET BY MOUTH EVERY DAY   escitalopram (LEXAPRO) 20 MG tablet TAKE 1 TABLET BY MOUTH EVERY DAY   levalbuterol (XOPENEX HFA) 45 MCG/ACT inhaler 2 puffs, Inhalation, Every 6 hours PRN   losartan (COZAAR) 100 mg, Oral, Daily   traZODone (DESYREL) 100 mg, Oral, At bedtime PRN       Objective:   Physical Exam BP 136/84   Pulse 79   Temp 98.3 F (36.8 C) (Oral)   Resp 16   Ht '4\' 10"'$  (1.473 m)   Wt 124 lb 5 oz (56.4 kg)   SpO2 97%   BMI 25.98 kg/m  General: Well developed, NAD, BMI noted Neck: No  thyromegaly  HEENT:  Normocephalic . Face symmetric, atraumatic.  TMs slightly bulged but not red Lungs:  Slightly increased expiratory time, + rhonchi.  No crackles. Normal respiratory effort, no intercostal retractions, no accessory muscle use. Heart: RRR,  no murmur.  Abdomen:  Not distended, soft, non-tender. No rebound or rigidity.   Lower extremities: no pretibial edema bilaterally  Skin: Exposed areas without rash. Not pale. Not jaundice Neurologic:  alert & oriented X3.  Speech normal, gait and motor exam symmetric, has left-sided residual weakness from a stroke. Psych: Cognition and judgment appear intact.  Cooperative with normal attention span and concentration.  Behavior appropriate. No anxious or depressed appearing.     Assessment     Assessment   Prediabetes: A1c 6.0 (April 2017)  HTN Hyperlipidemia Depression, insomnia GERD Stroke 03-2016: Sequela (L)  spastic hemiplegia   ICH, right basilar ganglia due to HTN emergency, +  encephalopathy, + dysphagia, aphasia, dysarthria Residual L spasticity.  CTA neck 03-2016 neg CTA head 10-17 (-) aneurysm Headaches , migraines dx after 2nd pregnancy, (-) CT head 2014 and 2015 Cough, persisting, saw Dr. Melvyn Novas 11-2015 OSA: Sleep study 08-2019, saw Dr. Annamaria Boots, intolerant to CPAP    PLAN Here for CPX Prediabetes: Doing great with diet, check A1c HTN: Reports normal ambulatory BPs, continue carvedilol, losartan, recent BMP okay Hyperlipidemia: On atorvastatin, check FLP. Depression insomnia: Symptoms controlled. Cough: Persistent  cough after the diagnosis of COVID approximately 3 weeks ago, some evidence of bronchospasm. Plan: Continue Robitussin DM, albuterol, Zithromax.  Stop [employer], they did not help much. Call if not gradually better RTC 6 months.

## 2023-01-05 NOTE — Assessment & Plan Note (Signed)
Here for CPX Prediabetes: Doing great with diet, check A1c HTN: Reports normal ambulatory BPs, continue carvedilol, losartan, recent BMP okay Hyperlipidemia: On atorvastatin, check FLP. Depression insomnia: Symptoms controlled. Cough: Persistent  cough after the diagnosis of COVID approximately 3 weeks ago, some evidence of bronchospasm. Plan: Continue Robitussin DM, albuterol, Zithromax.  Stop [employer], they did not help much. Call if not gradually better RTC 6 months.

## 2023-01-05 NOTE — Assessment & Plan Note (Signed)
-  Tdap 2015 -prevnar 12/2016;  PNM 23: 2019 - Covid vax  : UTD per pt , had a shot few days ago -Had a flu shot - female care:LOV w/ gyn 2020, complain of vaginal dryness, hot flashes.  Referral sent. -  MMG 02/2022 -CCS: +FH M (age 52) C-scope 10/06/2019, next 09/2024 per GI letter  -Labs: FLP and A1c -Doing great with diet, keeping her weight off.

## 2023-01-06 LAB — LIPID PANEL
Cholesterol: 118 mg/dL (ref 0–200)
HDL: 48.1 mg/dL (ref >39.00)
LDL Cholesterol: 56 mg/dL (ref 0–99)
NonHDL: 69.5
Total CHOL/HDL Ratio: 2
Triglycerides: 70 mg/dL (ref 0.0–149.0)
VLDL: 14 mg/dL (ref 0.0–40.0)

## 2023-01-06 LAB — HEMOGLOBIN A1C: Hgb A1c MFr Bld: 5.5 % (ref 4.6–6.5)

## 2023-01-06 NOTE — Telephone Encounter (Signed)
Ok, thx

## 2023-01-21 ENCOUNTER — Telehealth: Payer: Self-pay | Admitting: Internal Medicine

## 2023-01-21 ENCOUNTER — Encounter: Payer: No Typology Code available for payment source | Admitting: Physical Medicine & Rehabilitation

## 2023-01-21 ENCOUNTER — Other Ambulatory Visit: Payer: Self-pay | Admitting: Internal Medicine

## 2023-01-21 ENCOUNTER — Encounter: Payer: Self-pay | Admitting: Internal Medicine

## 2023-01-21 DIAGNOSIS — N644 Mastodynia: Secondary | ICD-10-CM

## 2023-01-21 DIAGNOSIS — G8114 Spastic hemiplegia affecting left nondominant side: Secondary | ICD-10-CM

## 2023-01-21 DIAGNOSIS — Z1231 Encounter for screening mammogram for malignant neoplasm of breast: Secondary | ICD-10-CM

## 2023-01-21 NOTE — Telephone Encounter (Signed)
Patient is requesting the mammogram order be changed to a diagnostic mammogram and a order for an ultrasound.Patient said she is having tingling in her left nipple which is why it needs to be changed from screening. Please advise patient once updated.

## 2023-01-21 NOTE — Telephone Encounter (Signed)
Orders placed. Mychart message sent to Pt informing orders have been placed.

## 2023-02-01 ENCOUNTER — Ambulatory Visit
Admission: RE | Admit: 2023-02-01 | Discharge: 2023-02-01 | Disposition: A | Discharge status: Home / Self Care | Payer: No Typology Code available for payment source | Source: Ambulatory Visit | Attending: Internal Medicine | Admitting: Internal Medicine

## 2023-02-01 ENCOUNTER — Ambulatory Visit
Admission: RE | Admit: 2023-02-01 | Discharge: 2023-02-01 | Disposition: A | Discharge status: Home / Self Care | Payer: Medicare Other | Source: Ambulatory Visit | Attending: Internal Medicine | Admitting: Internal Medicine

## 2023-02-01 ENCOUNTER — Other Ambulatory Visit: Payer: Self-pay | Admitting: Internal Medicine

## 2023-02-01 DIAGNOSIS — R921 Mammographic calcification found on diagnostic imaging of breast: Secondary | ICD-10-CM

## 2023-02-08 ENCOUNTER — Ambulatory Visit
Admission: RE | Admit: 2023-02-08 | Discharge: 2023-02-08 | Disposition: A | Discharge status: Home / Self Care | Payer: No Typology Code available for payment source | Source: Ambulatory Visit | Attending: Internal Medicine | Admitting: Internal Medicine

## 2023-02-08 DIAGNOSIS — R921 Mammographic calcification found on diagnostic imaging of breast: Secondary | ICD-10-CM

## 2023-02-08 HISTORY — PX: BREAST BIOPSY: SHX20

## 2023-02-11 ENCOUNTER — Encounter
Payer: No Typology Code available for payment source | Attending: Physical Medicine & Rehabilitation | Admitting: Physical Medicine & Rehabilitation

## 2023-02-11 ENCOUNTER — Encounter: Payer: Self-pay | Admitting: Physical Medicine & Rehabilitation

## 2023-02-11 VITALS — BP 158/106 | HR 67 | Ht <= 58 in | Wt 128.0 lb

## 2023-02-11 DIAGNOSIS — I619 Nontraumatic intracerebral hemorrhage, unspecified: Secondary | ICD-10-CM

## 2023-02-11 DIAGNOSIS — G8114 Spastic hemiplegia affecting left nondominant side: Secondary | ICD-10-CM | POA: Diagnosis not present

## 2023-02-11 MED ORDER — INCOBOTULINUMTOXINA 100 UNITS IM SOLR
300.0000 [IU] | Freq: Once | INTRAMUSCULAR | Status: AC
  Administered 2023-02-11: 300 [IU] via INTRAMUSCULAR

## 2023-02-11 NOTE — Progress Notes (Addendum)
Xeomin Injection for spasticity using needle EMG guidance  Dilution: 200 Units/ml Indication: Severe spasticity which interferes with ADL,mobility and/or  hygiene and is unresponsive to medication management and other conservative care Informed consent was obtained after describing risks and benefits of the procedure with the patient. This includes bleeding, bruising, infection, excessive weakness, or medication side effects. A REMS form is on file and signed. Needle:  needle electrode Number of units per muscle 200 units total Left FCR 50 units Left FDS 50 units Left FDP 50 units Left FPL 50 units  All injections were done after obtaining appropriate EMG activity and after negative drawback for blood. The patient tolerated the procedure well. Post procedure instructions were given. A followup appointment was made.   Will see pt back in 6 weeks, left ankle and foot tone looks ok today will reassess need for repeat tibial nerve neurolysis with phenol at that time.

## 2023-02-11 NOTE — Patient Instructions (Addendum)
You received a xeomin  injection today. You may experience muscle pains and aches. He may apply ice 20 minutes every 2 hours as needed for the next 24-48 hours. He also noticed bleeding or bruising in the areas that were injected. May apply Band-Aid. If this bruising is extensive, please notify our office. If there is evidence of increasing redness that occurs 2-3 days after injection. Please call our office. This could be a sign of infection. It is very rare, however. You may experience some muscle weakness in the muscles and injected. This would likely start in about one week.

## 2023-02-12 ENCOUNTER — Encounter: Payer: Self-pay | Admitting: Physical Medicine & Rehabilitation

## 2023-02-16 ENCOUNTER — Other Ambulatory Visit: Payer: Self-pay | Admitting: Internal Medicine

## 2023-02-16 MED ORDER — TIZANIDINE HCL 2 MG PO TABS: 4.0000 mg | ORAL_TABLET | Freq: Three times a day (TID) | ORAL | 1 refills | Status: DC

## 2023-02-17 NOTE — Telephone Encounter (Signed)
That is okay.

## 2023-02-17 NOTE — Telephone Encounter (Signed)
Levalbuterol (Xopenex) on back order. Okay to change to albuterol?

## 2023-02-23 ENCOUNTER — Other Ambulatory Visit: Payer: Self-pay | Admitting: Physical Medicine & Rehabilitation

## 2023-02-28 ENCOUNTER — Emergency Department (HOSPITAL_BASED_OUTPATIENT_CLINIC_OR_DEPARTMENT_OTHER): Payer: No Typology Code available for payment source

## 2023-02-28 ENCOUNTER — Other Ambulatory Visit: Payer: Self-pay

## 2023-02-28 ENCOUNTER — Encounter (HOSPITAL_BASED_OUTPATIENT_CLINIC_OR_DEPARTMENT_OTHER): Payer: Self-pay | Admitting: Emergency Medicine

## 2023-02-28 ENCOUNTER — Emergency Department (HOSPITAL_BASED_OUTPATIENT_CLINIC_OR_DEPARTMENT_OTHER)
Admission: EM | Admit: 2023-02-28 | Discharge: 2023-03-01 | Disposition: A | Discharge status: Home / Self Care | Payer: No Typology Code available for payment source | Attending: Emergency Medicine | Admitting: Emergency Medicine

## 2023-02-28 DIAGNOSIS — R0789 Other chest pain: Secondary | ICD-10-CM | POA: Diagnosis not present

## 2023-02-28 DIAGNOSIS — R0602 Shortness of breath: Secondary | ICD-10-CM | POA: Insufficient documentation

## 2023-02-28 DIAGNOSIS — Z91010 Allergy to peanuts: Secondary | ICD-10-CM | POA: Diagnosis not present

## 2023-02-28 DIAGNOSIS — R112 Nausea with vomiting, unspecified: Secondary | ICD-10-CM

## 2023-02-28 DIAGNOSIS — Z91040 Latex allergy status: Secondary | ICD-10-CM | POA: Diagnosis not present

## 2023-02-28 DIAGNOSIS — Z1152 Encounter for screening for COVID-19: Secondary | ICD-10-CM | POA: Insufficient documentation

## 2023-02-28 DIAGNOSIS — Z20822 Contact with and (suspected) exposure to covid-19: Secondary | ICD-10-CM | POA: Insufficient documentation

## 2023-02-28 DIAGNOSIS — R519 Headache, unspecified: Secondary | ICD-10-CM

## 2023-02-28 DIAGNOSIS — Z7902 Long term (current) use of antithrombotics/antiplatelets: Secondary | ICD-10-CM | POA: Insufficient documentation

## 2023-02-28 LAB — URINALYSIS, ROUTINE W REFLEX MICROSCOPIC
Bilirubin Urine: NEGATIVE
Glucose, UA: NEGATIVE mg/dL
Hgb urine dipstick: NEGATIVE
Ketones, ur: NEGATIVE mg/dL
Leukocytes,Ua: NEGATIVE
Nitrite: NEGATIVE
Protein, ur: NEGATIVE mg/dL
Specific Gravity, Urine: 1.015 (ref 1.005–1.030)
pH: 7.5 (ref 5.0–8.0)

## 2023-02-28 LAB — TROPONIN I (HIGH SENSITIVITY): Troponin I (High Sensitivity): <2 ng/L (ref <18)

## 2023-02-28 LAB — CBC WITH DIFFERENTIAL/PLATELET
Abs Immature Granulocytes: 0.01 10*3/uL (ref 0.00–0.07)
Basophils Absolute: 0 10*3/uL (ref 0.0–0.1)
Basophils Relative: 0 %
Eosinophils Absolute: 0 10*3/uL (ref 0.0–0.5)
Eosinophils Relative: 1 %
HCT: 36.7 % (ref 36.0–46.0)
Hemoglobin: 11.8 g/dL — ABNORMAL LOW (ref 12.0–15.0)
Immature Granulocytes: 0 %
Lymphocytes Relative: 36 %
Lymphs Abs: 1.7 10*3/uL (ref 0.7–4.0)
MCH: 30.3 pg (ref 26.0–34.0)
MCHC: 32.2 g/dL (ref 30.0–36.0)
MCV: 94.1 fL (ref 80.0–100.0)
Monocytes Absolute: 0.5 10*3/uL (ref 0.1–1.0)
Monocytes Relative: 10 %
Neutro Abs: 2.5 10*3/uL (ref 1.7–7.7)
Neutrophils Relative %: 53 %
Platelets: 163 10*3/uL (ref 150–400)
RBC: 3.9 MIL/uL (ref 3.87–5.11)
RDW: 14.6 % (ref 11.5–15.5)
WBC: 4.7 10*3/uL (ref 4.0–10.5)
nRBC: 0 % (ref 0.0–0.2)

## 2023-02-28 LAB — RESP PANEL BY RT-PCR (RSV, FLU A&B, COVID)  RVPGX2
Influenza A by PCR: NEGATIVE
Influenza B by PCR: NEGATIVE
Resp Syncytial Virus by PCR: NEGATIVE
SARS Coronavirus 2 by RT PCR: NEGATIVE

## 2023-02-28 LAB — COMPREHENSIVE METABOLIC PANEL
ALT: 30 U/L (ref 0–44)
AST: 26 U/L (ref 15–41)
Albumin: 3.6 g/dL (ref 3.5–5.0)
Alkaline Phosphatase: 62 U/L (ref 38–126)
Anion gap: 6 (ref 5–15)
BUN: 12 mg/dL (ref 6–20)
CO2: 29 mmol/L (ref 22–32)
Calcium: 8.9 mg/dL (ref 8.9–10.3)
Chloride: 102 mmol/L (ref 98–111)
Creatinine, Ser: 0.84 mg/dL (ref 0.44–1.00)
GFR, Estimated: >60 mL/min (ref >60)
Glucose, Bld: 84 mg/dL (ref 70–99)
Potassium: 3.4 mmol/L — ABNORMAL LOW (ref 3.5–5.1)
Sodium: 137 mmol/L (ref 135–145)
Total Bilirubin: 0.7 mg/dL (ref 0.3–1.2)
Total Protein: 7.4 g/dL (ref 6.5–8.1)

## 2023-02-28 LAB — LIPASE, BLOOD: Lipase: 33 U/L (ref 11–51)

## 2023-02-28 MED ORDER — DIPHENHYDRAMINE HCL 50 MG/ML IJ SOLN
50.0000 mg | Freq: Once | INTRAMUSCULAR | Status: AC
  Administered 2023-02-28: 50 mg via INTRAVENOUS
  Filled 2023-02-28: qty 1

## 2023-02-28 MED ORDER — PROCHLORPERAZINE MALEATE 10 MG PO TABS: 10.0000 mg | ORAL_TABLET | Freq: Two times a day (BID) | ORAL | 0 refills | Status: DC | PRN

## 2023-02-28 MED ORDER — SODIUM CHLORIDE 0.9 % IV BOLUS
1000.0000 mL | Freq: Once | INTRAVENOUS | Status: AC
  Administered 2023-02-28: 1000 mL via INTRAVENOUS

## 2023-02-28 MED ORDER — SODIUM CHLORIDE 0.9 % IV BOLUS
500.0000 mL | Freq: Once | INTRAVENOUS | Status: AC
  Administered 2023-02-28: 500 mL via INTRAVENOUS

## 2023-02-28 MED ORDER — PROCHLORPERAZINE EDISYLATE 10 MG/2ML IJ SOLN
10.0000 mg | Freq: Once | INTRAMUSCULAR | Status: AC
  Administered 2023-02-28: 10 mg via INTRAVENOUS
  Filled 2023-02-28: qty 2

## 2023-02-28 MED ORDER — KETOROLAC TROMETHAMINE 15 MG/ML IJ SOLN
15.0000 mg | Freq: Once | INTRAMUSCULAR | Status: AC
  Administered 2023-02-28: 15 mg via INTRAVENOUS
  Filled 2023-02-28: qty 1

## 2023-02-28 NOTE — ED Notes (Signed)
Pt. Is here due to headache and has noted severe L side weakness due to past history of stroke. Pt unable to move her L arm WNL and unable to move her L Leg WNL due to past history of stroke.  Pt. Was helped to the potty chair by RN Hancel Ion and tech [employer]

## 2023-02-28 NOTE — ED Notes (Signed)
ED Provider at bedside. 

## 2023-02-28 NOTE — ED Notes (Signed)
Pt transported to CT ?

## 2023-02-28 NOTE — Discharge Instructions (Signed)
Your history, exam, and workup today did not show evidence of acute stroke as your CT did not show bleeding or any new evidence of stroke.  Your other workup was overall reassuring and your urinalysis did not show UTI.  After the headache medications your headache improved as did your blood pressure.  We feel you are safe for discharge home after shared decision-making conversation.  We feel you are appropriate for outpatient neurology follow-up instead of ED to ED transfer for MRI tonight.  If any symptoms change or worsen acutely, please consider returning to the nearest emergency department or [facility] where they have neurology and MRI at all times.  Please rest and stay hydrated and use the medicine to help with headache and nausea.

## 2023-02-28 NOTE — ED Triage Notes (Signed)
Pt arrives pov, to triage in wheelchair c/o LT side non radiating CP  that started acutely 2 hrs pta. Pt endorses emesis pta, HA after emesis

## 2023-02-28 NOTE — ED Provider Notes (Signed)
[facility] Provider Note   [name]: [id number] Arrival date & time: 02/28/23  1712     History {Add pertinent medical, surgical, social history, OB history to HPI:1} Chief Complaint  Patient presents with   Chest Pain    Abigail Campos is a 52 y.o. female.  The history is provided by the patient and medical records. No language interpreter was used.  Chest Pain Pain location:  Substernal area Pain quality: tightness   Pain quality: not stabbing   Pain radiates to:  Does not radiate Pain severity:  Moderate Onset quality:  Gradual Duration:  1 day Timing:  Intermittent Progression:  Waxing and waning Chronicity:  New Relieved by:  Nothing Worsened by:  Nothing Associated symptoms: headache, nausea, shortness of breath, vomiting and weakness (at baseline per family)   Associated symptoms: no abdominal pain, no anorexia, no anxiety, no back pain, no cough, no diaphoresis, no dizziness, no fatigue, no fever, no near-syncope, no palpitations and no syncope        Home Medications Prior to Admission medications   Medication Sig Start Date End Date Taking? Authorizing Provider  albuterol (VENTOLIN HFA) 108 (90 Base) MCG/ACT inhaler Inhale 2 puffs into the lungs every 6 (six) hours as needed for wheezing or shortness of breath. 02/17/23   Colon Branch, MD  atorvastatin (LIPITOR) 20 MG tablet Take 1 tablet (20 mg total) by mouth daily. 11/26/22   Colon Branch, MD  azithromycin (ZITHROMAX Z-PAK) 250 MG tablet 2 tabs a day the first day, then 1 tab a day x 4 days 01/05/23   Colon Branch, MD  buPROPion [facility]) 100 MG tablet TAKE 1 TABLET BY MOUTH TWICE A DAY 07/30/22   Colon Branch, MD  carvedilol (COREG) 25 MG tablet TAKE 1 TABLET (25 MG TOTAL) BY MOUTH TWICE A DAY WITH MEALS 10/19/22   Colon Branch, MD  clopidogrel (PLAVIX) 75 MG tablet TAKE 1 TABLET BY MOUTH EVERY DAY 10/19/22   Colon Branch, MD  escitalopram (LEXAPRO) 20 MG tablet TAKE 1  TABLET BY MOUTH EVERY DAY 07/30/22   Colon Branch, MD  losartan (COZAAR) 100 MG tablet Take 1 tablet (100 mg total) by mouth daily. 12/09/22   Colon Branch, MD  tiZANidine (ZANAFLEX) 2 MG tablet TAKE 2 TABLETS (4 MG TOTAL) BY MOUTH 3 (THREE) TIMES DAILY. 02/23/23   Kirsteins, Luanna Salk, MD  traZODone (DESYREL) 100 MG tablet Take 1 tablet (100 mg total) by mouth at bedtime as needed for sleep. 09/21/22   Colon Branch, MD      Allergies    Bee venom, Peanut-containing drug products, Aspirin, Isovue [iopamidol], Latex, Ambien [zolpidem tartrate], and Hydrocodone    Review of Systems   Review of Systems  Constitutional:  Negative for chills, diaphoresis, fatigue and fever.  HENT:  Negative for congestion.   Eyes:  Negative for visual disturbance.  Respiratory:  Positive for shortness of breath. Negative for cough, chest tightness and wheezing.   Cardiovascular:  Positive for chest pain. Negative for palpitations, leg swelling, syncope and near-syncope.  Gastrointestinal:  Positive for nausea and vomiting. Negative for abdominal pain and anorexia.  Genitourinary:  Negative for dysuria and flank pain.  Musculoskeletal:  Positive for gait problem (slightly worse than baseline per family). Negative for back pain, neck pain and neck stiffness.  Skin:  Negative for rash and wound.  Neurological:  Positive for weakness (at baseline per family) and headaches.  Negative for dizziness, seizures, facial asymmetry, speech difficulty and light-headedness.  Psychiatric/Behavioral:  Negative for agitation and confusion.   All other systems reviewed and are negative.   Physical Exam Updated Vital Signs BP (!) 183/108   Pulse 61   Temp (!) 97 F (36.1 C) (Tympanic)   Resp 20   Ht 4\' 11"  (1.499 m)   Wt 58.1 kg   SpO2 98%   BMI 25.85 kg/m  Physical Exam Vitals and nursing note reviewed.  Constitutional:      General: She is not in acute distress.    Appearance: She is well-developed. She is not  ill-appearing, toxic-appearing or diaphoretic.  HENT:     Head: Normocephalic and atraumatic.  Eyes:     Extraocular Movements: Extraocular movements intact.     Conjunctiva/sclera: Conjunctivae normal.     Pupils: Pupils are equal, round, and reactive to light.  Cardiovascular:     Rate and Rhythm: Normal rate and regular rhythm.     Heart sounds: Normal heart sounds. No murmur heard. Pulmonary:     Effort: Pulmonary effort is normal. No respiratory distress.     Breath sounds: Normal breath sounds.  Abdominal:     Palpations: Abdomen is soft.     Tenderness: There is no abdominal tenderness.  Musculoskeletal:        General: No swelling.     Cervical back: Neck supple.  Skin:    General: Skin is warm and dry.     Capillary Refill: Capillary refill takes less than 2 seconds.     Findings: No erythema or rash.  Neurological:     Mental Status: She is alert.     Cranial Nerves: No facial asymmetry.     Motor: Abnormal muscle tone present.     Comments: Left arm and left leg weakness similar to baseline per family.  No numbness.  Normal finger-nose-finger testing and right arm.  Symmetric smile.  Clear speech.  Pupils symmetric and reactive with normal extract movements.  No carotid bruit.  Exam otherwise reassuring.  Psychiatric:        Mood and Affect: Mood normal.     ED Results / Procedures / Treatments   Labs (all labs ordered are listed, but only abnormal results are displayed) Labs Reviewed  RESP PANEL BY RT-PCR (RSV, FLU A&B, COVID)  [id number]  COMPREHENSIVE METABOLIC PANEL  LIPASE, BLOOD  CBC WITH DIFFERENTIAL/PLATELET  TROPONIN I (HIGH SENSITIVITY)    EKG EKG Interpretation  Date/Time:  Sunday February 28 2023 17:34:05 EDT Ventricular Rate:  57 PR Interval:  167 QRS Duration: 87 QT Interval:  427 QTC Calculation: 416 R Axis:   -31 Text Interpretation: Sinus rhythm Left axis deviation Consider anterior infarct when compared to prior, similar appearnce with  slower rate. No STEMI Confirmed by Antony Blackbird [phone]) on 02/28/2023 5:39:34 PM  Radiology No results found.  Procedures Procedures  {Document cardiac monitor, telemetry assessment procedure when appropriate:1}  Medications Ordered in ED Medications - No data to display  ED Course/ Medical Decision Making/ A&P   {   Click here for ABCD2, HEART and other calculatorsREFRESH Note before signing :1}                          Medical Decision Making Amount and/or Complexity of Data Reviewed Labs: ordered. Radiology: ordered.  Risk Prescription drug management.    JEWELDEAN FURMANSKI is a 52 y.o. female with a past medical history significant  for previous stroke with residual left arm and left leg weakness, previous intracerebral hemorrhage, hypertension, hyperlipidemia, migraines, anxiety, depression, and sleep apnea who presents with nausea, vomiting, chest discomfort, and headache.  According to patient and husband, this morning after church, patient was slightly more unsteady than her baseline.  He reports this happens every now and then.  Patient then started having some chest discomfort that felt like a squeezing and tightness in her central chest.  They were going to come to the emergency department evaluation but then she started having intense nausea and vomiting a significant amount.  She reports that did not change the chest discomfort.  She denies shortness of breath, congestion, cough, fevers, or chills.  After vomiting, patient reports she started getting some headache and that was moderate to severe.  She reports it is a 7 out of 10 in severity and that [place] since it began.  She reports no vision changes or speech difficulties.  No new neurologic deficits otherwise.  She thinks it was because her blood pressure likely went up with all the vomiting and her blood pressure was elevated on arrival.  It is since downtrending.  She denies any trauma.  Denies any neck pain or neck  stiffness.  Denies any constipation, diarrhea, or urinary changes.  No reported sick contacts.  Patient quickly taken to imaging area to get chest x-ray on arrival.  She also had viral swab sent given the GI symptoms and chest discomfort.  On my exam, lungs were clear and chest was nontender.  Abdomen nontender.  Good pulses in extremities.  Left arm and left leg are weak compared to right side but this is unchanged from her baseline she reports.  Symmetric smile for me.  Clear speech.  Pupils are symmetric and reactive with normal extract movements.  Sensation intact throughout.  Abdomen nontender.  Back nontender.  Patient well-appearing otherwise.  Family reports that patient has had similar headache within the last year and she had to get a headache cocktail that was symptoms.  Will give headache cocktail but we will get a CT head given the only blood pressure history of intracranial hemorrhage, and the nausea and vomiting with headache.  Will get chest x-ray with the chest discomfort and will get a cardiac workup as well.  Given the nausea and vomiting there have been a lot of viral gastritis and gastroenteritis in the community so this may be the cause of all of her symptoms.  Anticipate reassessment after imaging, labs, and reassessment after medications help with symptoms.  9:10 PM Patient reassessed and headache has improved from a 7 or 8 out of 10 down to a 5 out of 10.  We will add on a dose of Toradol as her kidney function is normal and give her some more fluids.  Family also now reports that she has had some urinary frequency that they did not discuss earlier.  Will send off a urinalysis.  Patient is feeling better after urinalysis is completed, anticipate discharge home.  We agreed that given her lack of other focal deficits and CT imaging showing no bleeding, we agreed that she likely does not need emergent transfer via ambulance for MRI tonight and can follow-up with her outpatient  neurology team this week for the week of slight foot dragging that comes and goes.  Anticipate reassessment and likely discharge after workup is completed.     {Document critical care time when appropriate:1} {Document review of labs and clinical decision  tools ie heart score, Chads2Vasc2 etc:1}  {Document your independent review of radiology images, and any outside records:1} {Document your discussion with family members, caretakers, and with consultants:1} {Document social determinants of health affecting pt's care:1} {Document your decision making why or why not admission, treatments were needed:1} Final Clinical Impression(s) / ED Diagnoses Final diagnoses:  None    Rx / DC Orders ED Discharge Orders     None

## 2023-02-28 NOTE — ED Notes (Signed)
Pt triage delayed d/t pt in bathroom after emesis

## 2023-03-03 ENCOUNTER — Encounter: Payer: Self-pay | Admitting: Internal Medicine

## 2023-03-04 ENCOUNTER — Encounter: Payer: Self-pay | Admitting: Internal Medicine

## 2023-03-04 ENCOUNTER — Ambulatory Visit (INDEPENDENT_AMBULATORY_CARE_PROVIDER_SITE_OTHER): Payer: No Typology Code available for payment source | Admitting: Internal Medicine

## 2023-03-04 VITALS — BP 140/82 | HR 69 | Temp 98.3°F | Resp 16 | Ht <= 58 in | Wt 128.0 lb

## 2023-03-04 DIAGNOSIS — F419 Anxiety disorder, unspecified: Secondary | ICD-10-CM | POA: Diagnosis not present

## 2023-03-04 DIAGNOSIS — R079 Chest pain, unspecified: Secondary | ICD-10-CM

## 2023-03-04 DIAGNOSIS — I10 Essential (primary) hypertension: Secondary | ICD-10-CM | POA: Diagnosis not present

## 2023-03-04 NOTE — Progress Notes (Signed)
Subjective:    Patient ID: Abigail Campos, female    DOB: 1971-04-13, 52 y.o.   MRN: [id number]  DOS:  03/04/2023 Type of visit - description: ER follow-up  Went to the ER 02/28/2023 with the following symptoms: After eating, she laid down to rest.  Shortly after developed  chest pain, at rest, located at the upper L>R chest. Pain is described as intense. She also developed sweats and she decided to go to the emergency room. On route to the ER by private car she developed nausea and vomiting.  She felt clammy. At the ER workup was started, while there she also developed a headache.  ER workup: Respiratory panel negative, troponin negative, potassium 3.4, hemoglobin 11.8, slightly low EKG: Sinus rhythm, no STEMI. CT head: No acute. Chest x-ray: No acute.  At the ER she received IV fluids, diphenhydramine, ketorolac and eventually felt better.  At this point, denies fever or chills No shortness of breath No further nausea vomiting.  Appetite is okay.  No blood in the stool or abdominal pain Headache is much better She did reports that she is still has some soreness on the upper left chest.  Happens at rest or when she is doing her ADLs at home. Denies any recent prolonged car trip, no calf pain.   Review of Systems See above   Past Medical History:  Diagnosis Date   Anemia    Anxiety and depression    Back pain    Essential hypertension 10/19/2007   03-2010: metoprolol changed to bystolic (was not feeling well on it, no specific allergy or reaction)     Food allergy    Hemiplegia (HCC)    Hyperlipemia    Hypertension    Joint pain    Migraine    Neuromuscular disorder ([place])    left sided hemiplegia - has brace for l leg and walks with a cane   Obesity    Ovarian cancer ([place])    Prediabetes    Sleep apnea    not wearing c-pap yet   Sleep apnea    Stroke ([place]) 2017   Vitamin D deficiency     Past Surgical History:  Procedure Laterality Date   ABDOMINAL  HYSTERECTOMY  11/14/2007   no oophorectomy per surgical report   BREAST BIOPSY Right 02/08/2023   MM RT BREAST BX W LOC DEV 1ST LESION IMAGE BX SPEC STEREO GUIDE 02/08/2023 [facility]MOGR[facility]   CESAREAN SECTION     [id number]   HERNIA REPAIR     INGUINAL HERNIA REPAIR     2004    Current Outpatient Medications  Medication Instructions   albuterol (VENTOLIN HFA) 108 (90 Base) MCG/ACT inhaler 2 puffs, Inhalation, Every 6 hours PRN   atorvastatin (LIPITOR) 20 mg, Oral, Daily   azithromycin (ZITHROMAX Z-PAK) 250 MG tablet 2 tabs a day the first day, then 1 tab a day x 4 days   carvedilol (COREG) 25 MG tablet TAKE 1 TABLET (25 MG TOTAL) BY MOUTH TWICE A DAY WITH MEALS   clopidogrel (PLAVIX) 75 MG tablet TAKE 1 TABLET BY MOUTH EVERY DAY   escitalopram (LEXAPRO) 20 MG tablet TAKE 1 TABLET BY MOUTH EVERY DAY   losartan (COZAAR) 100 mg, Oral, Daily   prochlorperazine (COMPAZINE) 10 mg, Oral, 2 times daily PRN   tiZANidine (ZANAFLEX) 4 mg, Oral, 3 times daily   traZODone (DESYREL) 100 mg, Oral, At bedtime PRN   Wellbutrin XL 150 mg, Oral, Daily  Objective:   Physical Exam BP (!) 144/92   Pulse 69   Temp 98.3 F (36.8 C) (Oral)   Resp 16   Ht 4\' 10"  (1.473 m)   Wt 128 lb (58.1 kg)   SpO2 99%   BMI 26.75 kg/m  General:   Well developed, NAD, BMI noted. HEENT:  Normocephalic . Face symmetric, atraumatic Lungs:  CTA B Normal respiratory effort, no intercostal retractions, no accessory muscle use. Chest wall: TTP at the costochondral area L > R Heart: RRR,  no murmur.  Lower extremities: no pretibial edema bilaterally  Skin: Not pale. Not jaundice Neurologic:  alert & oriented X3.  Neurological exam at baseline Psych--  Cognition and judgment appear intact.  Cooperative with normal attention span and concentration.  Behavior appropriate. No anxious or depressed appearing.      Assessment    Assessment   Prediabetes: A1c 6.0 (April 2017)   HTN Hyperlipidemia Depression, insomnia GERD Stroke 03-2016: [place] (L)  spastic hemiplegia   ICH, right basilar ganglia due to HTN emergency, + encephalopathy, + dysphagia, aphasia, dysarthria Residual L spasticity.  CTA neck 03-2016 neg CTA head 10-17 (-) aneurysm Headaches , migraines dx after 2nd pregnancy, (-) CT head 2014 and 2015 Cough, persisting, saw Dr. Melvyn Novas 11-2015 OSA: Sleep study 08-2019, saw Dr. Annamaria Boots, intolerant to CPAP    PLAN Chest pain, nausea, vomiting, headache: Symptoms as described above. Went to the ER, workup reviewed and reassuring. The only remaining symptom sees chest pain which is atypical, at rest or with ADLs.  Chest pain is somewhat reproducible. EKG today: Sinus rhythm, no acute changes, no changes compared to previous EKG. Although etiology of multiple symptoms is unclear, I do not suspect a cardiac etiology (see comments under anxiety).She is getting better.  I recommend observation, drink plenty of fluids, call if symptoms resurface.  Tylenol as needed for chest soreness Potassium, hemoglobin: Slightly low, check on RTC. HTN on carvedilol, losartan, BP slightly elevated today 144/92, recheck 140/82.  Recommend no change, good hydration and recheck at home. Anxiety: Had a stroke April 2017, reports she gets very anxious this time of the year, states that she panic when she had the chest pain.  Time spent 35 minutes, reviewing the chart, advising that the workup was reassuring. Has some anxiety, listening therapy provided.

## 2023-03-04 NOTE — Patient Instructions (Addendum)
Drink plenty fluids  Tylenol  500 mg OTC 2 tabs a day every 8 hours as needed for pain  Warm compress to the chest  Call or go to the ER if severe symptoms   Check the  blood pressure regularly BP GOAL is between 110/65 and  135/85. If it is consistently higher or lower, let me know      GO TO THE FRONT DESK, [place] back for   checkup in 2 months

## 2023-03-05 NOTE — Assessment & Plan Note (Signed)
Chest pain, nausea, vomiting, headache: Symptoms as described above. Went to the ER, workup reviewed and reassuring. The only remaining symptom sees chest pain which is atypical, at rest or with ADLs.  Chest pain is somewhat reproducible. EKG today: Sinus rhythm, no acute changes, no changes compared to previous EKG. Although etiology of multiple symptoms is unclear, I do not suspect a cardiac etiology (see comments under anxiety).She is getting better.  I recommend observation, drink plenty of fluids, call if symptoms resurface.  Tylenol as needed for chest soreness Potassium, hemoglobin: Slightly low, check on RTC. HTN on carvedilol, losartan, BP slightly elevated today 144/92, recheck 140/82.  Recommend no change, good hydration and recheck at home. Anxiety: Had a stroke April 2017, reports she gets very anxious this time of the year, states that she panic when she had the chest pain.

## 2023-03-09 ENCOUNTER — Encounter: Payer: Self-pay | Admitting: Physical Medicine & Rehabilitation

## 2023-03-09 DIAGNOSIS — I619 Nontraumatic intracerebral hemorrhage, unspecified: Secondary | ICD-10-CM

## 2023-03-14 ENCOUNTER — Other Ambulatory Visit: Payer: Self-pay | Admitting: Internal Medicine

## 2023-03-18 ENCOUNTER — Other Ambulatory Visit: Payer: Self-pay | Admitting: Family Medicine

## 2023-03-18 ENCOUNTER — Ambulatory Visit
Admission: RE | Admit: 2023-03-18 | Discharge: 2023-03-18 | Disposition: A | Discharge status: Home / Self Care | Payer: No Typology Code available for payment source | Source: Ambulatory Visit | Attending: Family Medicine | Admitting: Family Medicine

## 2023-03-18 DIAGNOSIS — M79672 Pain in left foot: Secondary | ICD-10-CM

## 2023-03-19 ENCOUNTER — Encounter: Payer: Self-pay | Admitting: Internal Medicine

## 2023-03-20 ENCOUNTER — Other Ambulatory Visit: Payer: Self-pay | Admitting: Internal Medicine

## 2023-03-22 ENCOUNTER — Ambulatory Visit: Payer: No Typology Code available for payment source | Admitting: Physical Therapy

## 2023-03-25 ENCOUNTER — Encounter: Payer: No Typology Code available for payment source | Admitting: Physical Medicine & Rehabilitation

## 2023-04-07 ENCOUNTER — Telehealth: Payer: Self-pay | Admitting: Internal Medicine

## 2023-04-07 NOTE — Telephone Encounter (Signed)
Contacted [facility] [place] to schedule their annual wellness visit. Appointment made for 04/26/2023.  Verlee Rossetti; Care Guide Ambulatory Clinical Support [place] [facility] Direct Dial: [phone]

## 2023-04-19 ENCOUNTER — Other Ambulatory Visit: Payer: Self-pay | Admitting: Internal Medicine

## 2023-04-20 ENCOUNTER — Ambulatory Visit: Payer: No Typology Code available for payment source

## 2023-04-20 ENCOUNTER — Encounter: Payer: No Typology Code available for payment source | Admitting: Internal Medicine

## 2023-04-21 ENCOUNTER — Ambulatory Visit (INDEPENDENT_AMBULATORY_CARE_PROVIDER_SITE_OTHER): Payer: No Typology Code available for payment source | Admitting: Obstetrics & Gynecology

## 2023-04-21 ENCOUNTER — Encounter: Payer: Self-pay | Admitting: Obstetrics & Gynecology

## 2023-04-21 ENCOUNTER — Other Ambulatory Visit (HOSPITAL_COMMUNITY)
Admission: RE | Admit: 2023-04-21 | Discharge: 2023-04-21 | Disposition: A | Discharge status: Home / Self Care | Payer: No Typology Code available for payment source | Source: Ambulatory Visit | Attending: Obstetrics & Gynecology | Admitting: Obstetrics & Gynecology

## 2023-04-21 VITALS — BP 129/83 | HR 73 | Wt 130.0 lb

## 2023-04-21 DIAGNOSIS — N898 Other specified noninflammatory disorders of vagina: Secondary | ICD-10-CM | POA: Insufficient documentation

## 2023-04-21 DIAGNOSIS — B9689 Other specified bacterial agents as the cause of diseases classified elsewhere: Secondary | ICD-10-CM | POA: Diagnosis not present

## 2023-04-21 DIAGNOSIS — Z1211 Encounter for screening for malignant neoplasm of colon: Secondary | ICD-10-CM

## 2023-04-21 DIAGNOSIS — Z01419 Encounter for gynecological examination (general) (routine) without abnormal findings: Secondary | ICD-10-CM | POA: Diagnosis not present

## 2023-04-21 DIAGNOSIS — N760 Acute vaginitis: Secondary | ICD-10-CM

## 2023-04-21 DIAGNOSIS — N951 Menopausal and female climacteric states: Secondary | ICD-10-CM | POA: Diagnosis not present

## 2023-04-21 NOTE — Progress Notes (Addendum)
GYNECOLOGY ANNUAL PREVENTATIVE CARE ENCOUNTER NOTE  History:     Abigail Campos is a 52 y.o. G67P2002 female s/p total hysterectomy for benign indications here for a routine annual gynecologic exam.  Current complaints: vaginal odor for few days and increased hot flashes.  Complicated medical history including history of stroke with residual deficits on her left side.   Denies abnormal vaginal bleeding, discharge, pelvic pain, or other gynecologic concerns.    Gynecologic History No LMP recorded. Patient has had a hysterectomy. She is unsure if she has a cervix in place. Last Mammogram: 01/27/2023.  Result was abnormal, benign follow up Last Colonoscopy: 10/06/2019.  Result was normal  Obstetric History OB History  Gravida Para Term Preterm AB Living  2 2 2     2   SAB IAB Ectopic Multiple Live Births          2    # Outcome Date GA Lbr Len/2nd Weight Sex Delivery Anes PTL Lv  2 Term 2007 [redacted]w[redacted]d   M CS-LTranv Local N LIV  1 Term 1998 [redacted]w[redacted]d   F CS-LTranv Spinal N LIV    Past Medical History:  Diagnosis Date   Anemia    Anxiety and depression    Back pain    Essential hypertension 10/19/2007   03-2010: metoprolol changed to bystolic (was not feeling well on it, no specific allergy or reaction)     Food allergy    Hemiplegia (HCC)    Hyperlipemia    Hypertension    Joint pain    Migraine    Neuromuscular disorder (HCC)    left sided hemiplegia - has brace for l leg and walks with a cane   Obesity    Ovarian cancer (HCC)    Prediabetes    Sleep apnea    not wearing c-pap yet   Sleep apnea    Stroke (HCC) 2017   Vitamin D deficiency     Past Surgical History:  Procedure Laterality Date   ABDOMINAL HYSTERECTOMY  11/14/2007   no oophorectomy per surgical report   BREAST BIOPSY Right 02/08/2023   MM RT BREAST BX W LOC DEV 1ST LESION IMAGE BX SPEC STEREO GUIDE 02/08/2023 [facility]   CESAREAN SECTION     1610,9604   HERNIA REPAIR     INGUINAL HERNIA REPAIR      2004    Current Outpatient Medications on File Prior to Visit  Medication Sig Dispense Refill   atorvastatin (LIPITOR) 20 MG tablet Take 1 tablet (20 mg total) by mouth daily. 90 tablet 1   carvedilol (COREG) 25 MG tablet TAKE 1 TABLET (25 MG TOTAL) BY MOUTH TWICE A DAY WITH MEALS 180 tablet 1   clopidogrel (PLAVIX) 75 MG tablet Take 1 tablet (75 mg total) by mouth daily. 90 tablet 1   escitalopram (LEXAPRO) 20 MG tablet Take 1 tablet (20 mg total) by mouth daily. 90 tablet 1   furosemide (LASIX) 20 MG tablet Take 1 tablet (20 mg total) by mouth daily. 90 tablet 1   losartan (COZAAR) 100 MG tablet Take 1 tablet (100 mg total) by mouth daily. 90 tablet 1   prochlorperazine (COMPAZINE) 10 MG tablet Take 1 tablet (10 mg total) by mouth 2 (two) times daily as needed for nausea or vomiting. 20 tablet 0   tiZANidine (ZANAFLEX) 2 MG tablet TAKE 2 TABLETS (4 MG TOTAL) BY MOUTH 3 (THREE) TIMES DAILY. 540 tablet 1   traZODone (DESYREL) 100 MG tablet Take 1 tablet (  100 mg total) by mouth at bedtime as needed for sleep. 90 tablet 1   WEGOVY 0.25 MG/0.5ML SOAJ Inject into the skin.     WELLBUTRIN XL 150 MG 24 hr tablet Take 150 mg by mouth daily.     No current facility-administered medications on file prior to visit.    Allergies  Allergen Reactions   Bee Venom Anaphylaxis   Peanut-Containing Drug Products Anaphylaxis   Aspirin Other (See Comments)    Reports blisters w/ ASA but pt reports is ok w/ Motrin, Advil, naproxen   Isovue [Iopamidol] Hives and Itching    Pt broke out with one facial hive.  Had itching on her right upper ant and posterior arm w/o evidence of hives.  Pt given water and 25mg  benadryl po.  We observed pt for 30 minutes before d/c.  J. Bohm     Latex Hives   Ambien [Zolpidem Tartrate] Other (See Comments)    forgetfulness   Hydrocodone Itching    Generalized itching w/o rash    Social History:  reports that she has never smoked. She has never used smokeless tobacco. She  reports that she does not drink alcohol and does not use drugs.  Family History  Problem Relation Age of Onset   Diabetes Mother    Hypertension Mother    Glaucoma Mother    Colon cancer Mother        mother age 47   Cancer Mother    Anxiety disorder Mother    Obesity Mother    Rectal cancer Father        dx 74   Hypertension Father    Cancer Father    Sleep apnea Father    Alcoholism Father    Obesity Father    Stroke Paternal Grandfather    Stroke Maternal Grandfather    Diabetes Brother    Hypertension Brother    Heart disease Maternal Aunt        x 2 did 2012 CAD-CHF   Diabetes Brother    Hypertension Brother    Lung cancer Maternal Aunt    Breast cancer Neg Hx    Esophageal cancer Neg Hx    Stomach cancer Neg Hx     The following portions of the patient's history were reviewed and updated as appropriate: allergies, current medications, past family history, past medical history, past social history, past surgical history and problem list.  Review of Systems Pertinent items noted in HPI and remainder of comprehensive ROS otherwise negative.  Physical Exam:  BP 129/83   Pulse 73   Wt 130 lb (59 kg)   BMI 27.17 kg/m  CONSTITUTIONAL: Well-developed, well-nourished female in no acute distress.  HENT:  Normocephalic, atraumatic, External right and left ear normal.  EYES: Conjunctivae and EOM are normal. Pupils are equal, round, and reactive to light. No scleral icterus.  NECK: Normal range of motion, supple, no masses.  Normal thyroid.  SKIN: Skin is warm and dry. No rash noted. Not diaphoretic. No erythema. No pallor. MUSCULOSKELETAL: Normal range of motion. No tenderness.  No cyanosis, clubbing, or edema. NEUROLOGIC: Alert and oriented to person, place, and time. Normal reflexes, muscle tone coordination.  PSYCHIATRIC: Normal mood and affect. Normal behavior. Normal judgment and thought content. CARDIOVASCULAR: Normal heart rate noted, regular rhythm RESPIRATORY:  Clear to auscultation bilaterally. Effort and breath sounds normal, no problems with respiration noted. BREASTS: Symmetric in size. No masses, tenderness, skin changes, nipple drainage, or lymphadenopathy bilaterally. Performed in the presence of  a chaperone. ABDOMEN: Soft, no distention noted.  No tenderness, rebound or guarding.  PELVIC: Normal appearing external genitalia and urethral meatus; normal appearing distal vaginal mucosa.  White vaginal discharge noted, testing sample obtained.  Unable to palpate any cervix on bimanual exam but exam was hindered by limited mobility of left leg, also presence of significant hard stool in rectum bulging up into vagina.  Patient was very uncomfortable. Performed in the presence of a chaperone.   Assessment and Plan:     1. Vaginal odor Proper vulvar hygiene emphasized: discussed avoidance of perfumed soaps, detergents, lotions and any type of douches; in addition to wearing cotton underwear and no underwear at night.  Also recommended cleaning front to back, voiding and cleaning up after intercourse.  - Cervicovaginal ancillary only( [place]) done, will follow up results and manage accordingly.  2. Menopausal vasomotor syndrome Already on maximum dosage of Lexapro, not a HT candidate given her CVA.  Does not want to change to Effexor. Does not want Gabpentin. Will continue to monitor.  3. Colon cancer screening Discussed need for colon cancer screening over the age 63. Colonoscopy recommended as this is the gold standard. Referral made to Gastroenterology for colonoscopy - Ambulatory referral to Gastroenterology  4. Well woman exam with routine gynecological exam No other concerns.  Likely had a total hysterectomy, unable to palpate cervix but it was a difficult exam.  Routine preventative health maintenance measures emphasized. Please refer to After Visit Summary for other counseling recommendations.      Jaynie Collins, MD,  FACOG Obstetrician & Gynecologist, [facility] for [employer], [facility]

## 2023-04-22 ENCOUNTER — Encounter: Payer: Self-pay | Admitting: Physical Medicine & Rehabilitation

## 2023-04-22 ENCOUNTER — Encounter
Payer: No Typology Code available for payment source | Attending: Physical Medicine & Rehabilitation | Admitting: Physical Medicine & Rehabilitation

## 2023-04-22 VITALS — BP 132/91 | HR 79 | Ht <= 58 in | Wt 130.0 lb

## 2023-04-22 DIAGNOSIS — I619 Nontraumatic intracerebral hemorrhage, unspecified: Secondary | ICD-10-CM | POA: Diagnosis not present

## 2023-04-22 DIAGNOSIS — G8114 Spastic hemiplegia affecting left nondominant side: Secondary | ICD-10-CM | POA: Insufficient documentation

## 2023-04-22 NOTE — Patient Instructions (Signed)
Will decide on therapy and whether we need to repeat tibial nerve block Left leg at the next botox visit for the left arm

## 2023-04-22 NOTE — Progress Notes (Signed)
Subjective:    Patient ID: Abigail Campos, female    DOB: 1971/08/30, 52 y.o.   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  She has chronic left spastic hemiplegia affecting the upper and lower limb.  She has been getting botulinum toxin injections for the upper limb and for the lower limb phenol injections of the tibial nerve.  Her last phenol injection was in December 2023  Xeomin 02/11/23  200 units total Left FCR 50 units Left FDS 50 units Left FDP 50 units Left FPL 50 units  Fell at home 03/18/23 Left 5th metatarsal fx, in cam walker boot, has follow-up with orthopedic surgeon tomorrow Pain Inventory Average Pain 7 Pain Right Now 7 My pain is constant, dull, and aching  In the last 24 hours, has pain interfered with the following? General activity 5 Relation with others 0 Enjoyment of life 0 What TIME of day is your pain at its worst? morning  and night Sleep (in general) Fair  Pain is worse with: walking, standing, and some activites Pain improves with: rest and medication Relief from Meds: 5  Family History  Problem Relation Age of Onset   Diabetes Mother    Hypertension Mother    Glaucoma Mother    Colon cancer Mother        mother age 18   Cancer Mother    Anxiety disorder Mother    Obesity Mother    Rectal cancer Father        dx 78   Hypertension Father    Cancer Father    Sleep apnea Father    Alcoholism Father    Obesity Father    Stroke Paternal Grandfather    Stroke Maternal Grandfather    Diabetes Brother    Hypertension Brother    Heart disease Maternal Aunt        x 2 did 2012 CAD-CHF   Diabetes Brother    Hypertension Brother    Lung cancer Maternal Aunt    Breast cancer Neg Hx    Esophageal cancer Neg Hx    Stomach cancer Neg Hx    Social History   Socioeconomic History   Marital status: Married    Spouse name: Vonna Kotyk   Number of  children: 2   Years of education: Not on file   Highest education level: Associate degree: occupational, Scientist, product/process development, or vocational program  Occupational History   Occupation: disable d/t stroke TEACHER    Employer: [employer]  Tobacco Use   Smoking status: Never   Smokeless tobacco: Never  Vaping Use   Vaping Use: Never used  Substance and Sexual Activity   Alcohol use: No   Drug use: No   Sexual activity: Not Currently    Birth control/protection: Surgical  Other Topics Concern   Not on file  Social History Narrative   Lives with husband and son (2007)   Left Hand prior to stroke   Drinks 1-2 cups daily   Daughter (1998), married    Son 2008   Social Determinants of Health   Financial Resource Strain: Medium Risk (03/03/2023)   Overall Financial Resource Strain (CARDIA)    Difficulty of Paying Living Expenses: Somewhat hard  Food Insecurity: Food Insecurity Present (03/03/2023)   Hunger Vital Sign    Worried About Running Out of Food in the Last Year: Often true    Ran Out of Food in the  Last Year: Sometimes true  Transportation Needs: Unknown (03/03/2023)   PRAPARE - Administrator, Civil Service (Medical): Patient declined    Lack of Transportation (Non-Medical): No  Physical Activity: Insufficiently Active (03/03/2023)   Exercise Vital Sign    Days of Exercise per Week: 4 days    Minutes of Exercise per Session: 30 min  Stress: Patient Declined (03/03/2023)   [employer] of Occupational Health - Occupational Stress Questionnaire    Feeling of Stress : Patient declined  Social Connections: Moderately Integrated (03/03/2023)   Social Connection and Isolation Panel [NHANES]    Frequency of Communication with Friends and Family: Once a week    Frequency of Social Gatherings with Friends and Family: Once a week    Attends Religious Services: More than 4 times per year    Active Member of Clubs or Organizations: Yes    Attends Hospital doctor: More than 4 times per year    Marital Status: Married   Past Surgical History:  Procedure Laterality Date   ABDOMINAL HYSTERECTOMY  11/14/2007   no oophorectomy per surgical report   BREAST BIOPSY Right 02/08/2023   MM RT BREAST BX W LOC DEV 1ST LESION IMAGE BX SPEC STEREO GUIDE 02/08/2023 [facility]   CESAREAN SECTION     [id number]   HERNIA REPAIR     INGUINAL HERNIA REPAIR     2004   Past Surgical History:  Procedure Laterality Date   ABDOMINAL HYSTERECTOMY  11/14/2007   no oophorectomy per surgical report   BREAST BIOPSY Right 02/08/2023   MM RT BREAST BX W LOC DEV 1ST LESION IMAGE BX SPEC STEREO GUIDE 02/08/2023 [facility]   CESAREAN SECTION     [id number]   HERNIA REPAIR     INGUINAL HERNIA REPAIR     2004   Past Medical History:  Diagnosis Date   Anemia    Anxiety and depression    Back pain    Essential hypertension 10/19/2007   03-2010: metoprolol changed to bystolic (was not feeling well on it, no specific allergy or reaction)     Food allergy    Hemiplegia (HCC)    Hyperlipemia    Hypertension    Joint pain    Migraine    Neuromuscular disorder (HCC)    left sided hemiplegia - has brace for l leg and walks with a cane   Obesity    Ovarian cancer (HCC)    Prediabetes    Sleep apnea    not wearing c-pap yet   Sleep apnea    Stroke (HCC) 2017   Vitamin D deficiency    BP (!) 132/91   Pulse 79   Ht 4\' 10"  (1.473 m)   Wt 130 lb (59 kg)   SpO2 96%   BMI 27.17 kg/m   Opioid Risk Score:   Fall Risk Score:  `1  Depression screen [facility] 2/9     03/04/2023   11:31 AM 01/05/2023    1:50 PM 12/15/2022   12:54 PM 12/11/2022   10:02 AM 10/01/2022   12:06 PM 08/19/2022    1:09 PM 06/26/2022   11:10 AM  Depression screen PHQ 2/9  Decreased Interest 0 0 0 0 0 0 0  Down, Depressed, Hopeless 0 0 0 0 0 0 0  PHQ - 2 Score 0 0 0 0 0 0 0  Altered sleeping 3 3 3   3    Tired, decreased energy 1 1 1  1   Change in appetite 0 0 0   0   Feeling  bad or failure about yourself  1 1 1   1    Trouble concentrating 3 3 3   3    Moving slowly or fidgety/restless 0 0 0   0   Suicidal thoughts 0 0 0   0   PHQ-9 Score 8 8 8   8       Review of Systems  Musculoskeletal:        LT foot pain  All other systems reviewed and are negative.      Objective:   Physical Exam  General no acute distress Mood and affect are appropriate Extremities without edema Motor strength is to minus deltoid on the left 2 - bicep 0 triceps 0 finger flexors and extensors Tone MAS 2 at the elbow flexors 1 at the wrist flexors 2 at the finger flexors and thumb flexor Left lower extremity can extend antigravity with the boot on the left knee extensors otherwise lower extremity not tested due to a cam walker boot. Speech without dysarthria or aphasia      Assessment & Plan:   1.  Left spastic hemiplegia upper extremity with good response to Xeomin at the current dosing and muscle group selection.  Repeat in approximately 1 month Left lower extremity spasticity unable to assess adequately due to recent fracture now in cam walker boot.  Plan to reassess at next visit

## 2023-04-23 LAB — CERVICOVAGINAL ANCILLARY ONLY
Bacterial Vaginitis (gardnerella): POSITIVE — AB
Candida Glabrata: NEGATIVE
Candida Vaginitis: NEGATIVE
Comment: NEGATIVE
Comment: NEGATIVE
Comment: NEGATIVE

## 2023-04-26 ENCOUNTER — Ambulatory Visit (INDEPENDENT_AMBULATORY_CARE_PROVIDER_SITE_OTHER): Payer: No Typology Code available for payment source | Admitting: *Deleted

## 2023-04-26 ENCOUNTER — Encounter: Payer: Self-pay | Admitting: Internal Medicine

## 2023-04-26 ENCOUNTER — Encounter: Payer: Self-pay | Admitting: Obstetrics & Gynecology

## 2023-04-26 DIAGNOSIS — Z0000 Encounter for general adult medical examination without abnormal findings: Secondary | ICD-10-CM

## 2023-04-26 MED ORDER — METRONIDAZOLE 500 MG PO TABS: 500.0000 mg | ORAL_TABLET | Freq: Two times a day (BID) | ORAL | 0 refills | Status: AC

## 2023-04-26 NOTE — Addendum Note (Signed)
Addended by: Jaynie Collins A on: 04/26/2023 03:16 PM   Modules accepted: Orders

## 2023-04-26 NOTE — Progress Notes (Signed)
Subjective:  Pt completed ADLs, Fall risk, and SDOH during e-check-in on 04/22/23, Depression screen done in office on 04/22/23.  Answers verified with pt.    Abigail Campos is a 52 y.o. female who presents for Medicare Annual (Subsequent) preventive examination.  I connected with  PORSCHA ROYER on 04/26/23 by a audio enabled telemedicine application and verified that I am speaking with the correct person using two identifiers.  Patient Location: Home  Provider Location: Office/Clinic  I discussed the limitations of evaluation and management by telemedicine. The patient expressed understanding and agreed to proceed.   Review of Systems     Cardiac Risk Factors include: advanced age (>27men, >59 women);dyslipidemia;hypertension     Objective:    There were no vitals filed for this visit. There is no height or weight on file to calculate BMI.     04/26/2023   10:21 AM 02/28/2023    5:29 PM 10/17/2022    4:22 PM 08/31/2022   11:48 AM 04/17/2022    8:19 AM 01/06/2022    3:09 PM 10/29/2021   12:44 PM  Advanced Directives  Does Patient Have a Medical Advance Directive? Yes No No Yes Yes No Yes  Type of Advance Directive    Living will;Healthcare Power of [employer] Power of [place];Out of facility DNR (pink MOST or yellow form);Living will  Living will;Healthcare Power of Attorney  Does patient want to make changes to medical advance directive? No - Patient declined   No - Patient declined   No - Patient declined  Copy of Healthcare Power of Attorney in Chart? No - copy requested    No - copy requested    Would patient like information on creating a medical advance directive?   No - Patient declined        Current Medications (verified) Outpatient Encounter Medications as of 04/26/2023  Medication Sig   atorvastatin (LIPITOR) 20 MG tablet Take 1 tablet (20 mg total) by mouth daily.   carvedilol (COREG) 25 MG tablet TAKE 1 TABLET (25 MG TOTAL) BY MOUTH TWICE A DAY  WITH MEALS   clopidogrel (PLAVIX) 75 MG tablet Take 1 tablet (75 mg total) by mouth daily.   escitalopram (LEXAPRO) 20 MG tablet Take 1 tablet (20 mg total) by mouth daily.   losartan (COZAAR) 100 MG tablet Take 1 tablet (100 mg total) by mouth daily.   prochlorperazine (COMPAZINE) 10 MG tablet Take 1 tablet (10 mg total) by mouth 2 (two) times daily as needed for nausea or vomiting.   tiZANidine (ZANAFLEX) 2 MG tablet TAKE 2 TABLETS (4 MG TOTAL) BY MOUTH 3 (THREE) TIMES DAILY.   traZODone (DESYREL) 100 MG tablet Take 1 tablet (100 mg total) by mouth at bedtime as needed for sleep.   WEGOVY 0.25 MG/0.5ML SOAJ Inject into the skin.   WELLBUTRIN XL 150 MG 24 hr tablet Take 150 mg by mouth daily.   No facility-administered encounter medications on file as of 04/26/2023.    Allergies (verified) Bee venom, Peanut-containing drug products, Aspirin, Isovue [iopamidol], Latex, Ambien [zolpidem tartrate], and Hydrocodone   History: Past Medical History:  Diagnosis Date   Allergy    Anemia    Anxiety and depression    Arthritis    Back pain    Blood transfusion without reported diagnosis    Essential hypertension 10/19/2007   03-2010: metoprolol changed to bystolic (was not feeling well on it, no specific allergy or reaction)     Food allergy  Hemiplegia (HCC)    Hyperlipemia    Hypertension    Joint pain    Migraine    Neuromuscular disorder (HCC)    left sided hemiplegia - has brace for l leg and walks with a cane   Obesity    Ovarian cancer (HCC)    Prediabetes    Sleep apnea    not wearing c-pap yet   Sleep apnea    Stroke (HCC) 2017   Vitamin D deficiency    Past Surgical History:  Procedure Laterality Date   ABDOMINAL HYSTERECTOMY  11/14/2007   no oophorectomy per surgical report   BREAST BIOPSY Right 02/08/2023   MM RT BREAST BX W LOC DEV 1ST LESION IMAGE BX SPEC STEREO GUIDE 02/08/2023 [facility]   CESAREAN SECTION     [id number]   HERNIA REPAIR     INGUINAL  HERNIA REPAIR     2004   Family History  Problem Relation Age of Onset   Diabetes Mother    Hypertension Mother    Glaucoma Mother    Colon cancer Mother        mother age 55   Cancer Mother    Anxiety disorder Mother    Obesity Mother    Rectal cancer Father        dx 27   Hypertension Father    Cancer Father    Sleep apnea Father    Alcoholism Father    Obesity Father    Stroke Paternal Grandfather    Stroke Maternal Grandfather    Diabetes Brother    Hypertension Brother    Heart disease Maternal Aunt        x 2 did 2012 CAD-CHF   Diabetes Brother    Hypertension Brother    Lung cancer Maternal Aunt    Breast cancer Neg Hx    Esophageal cancer Neg Hx    Stomach cancer Neg Hx    Social History   Socioeconomic History   Marital status: Married    Spouse name: Vonna Kotyk   Number of children: 2   Years of education: Not on file   Highest education level: Associate degree: occupational, Scientist, product/process development, or vocational program  Occupational History   Occupation: disable d/t stroke TEACHER    Employer: [employer]  Tobacco Use   Smoking status: Never   Smokeless tobacco: Never  Vaping Use   Vaping Use: Never used  Substance and Sexual Activity   Alcohol use: No   Drug use: No   Sexual activity: Not Currently    Birth control/protection: Surgical  Other Topics Concern   Not on file  Social History Narrative   Lives with husband and son (2007)   Left Hand prior to stroke   Drinks 1-2 cups daily   Daughter (1998), married    Son 2008   Social Determinants of Health   Financial Resource Strain: Low Risk  (04/22/2023)   Overall Financial Resource Strain (CARDIA)    Difficulty of Paying Living Expenses: Not very hard  Recent Concern: Financial Resource Strain - Medium Risk (03/03/2023)   Overall Financial Resource Strain (CARDIA)    Difficulty of Paying Living Expenses: Somewhat hard  Food Insecurity: Food Insecurity Present (04/22/2023)   Hunger Vital Sign    Worried  About Running Out of Food in the Last Year: Often true    Ran Out of Food in the Last Year: Sometimes true  Transportation Needs: No Transportation Needs (04/22/2023)   PRAPARE - Transportation  Lack of Transportation (Medical): No    Lack of Transportation (Non-Medical): No  Physical Activity: Patient Declined (04/22/2023)   Exercise Vital Sign    Days of Exercise per Week: Patient declined    Minutes of Exercise per Session: Patient declined  Recent Concern: Physical Activity - Insufficiently Active (03/03/2023)   Exercise Vital Sign    Days of Exercise per Week: 4 days    Minutes of Exercise per Session: 30 min  Stress: Patient Declined (04/22/2023)   [employer] of Occupational Health - Occupational Stress Questionnaire    Feeling of Stress : Patient declined  Social Connections: Unknown (04/22/2023)   Social Connection and Isolation Panel [NHANES]    Frequency of Communication with Friends and Family: Once a week    Frequency of Social Gatherings with Friends and Family: Never    Attends Religious Services: More than 4 times per year    Active Member of [employer] or Organizations: Yes    Attends Banker Meetings: 1 to 4 times per year    Marital Status: Patient declined    Tobacco Counseling Counseling given: Not Answered   Clinical Intake:  Pre-visit preparation completed: Yes  Pain : No/denies pain  BMI - recorded: 27.17 Nutritional Status: BMI 25 -29 Overweight Nutritional Risks: None Diabetes: No  How often do you need to have someone help you when you read instructions, pamphlets, or other written materials from your doctor or pharmacy?: 3 - Sometimes   Activities of Daily Living    04/22/2023    6:51 AM  In your present state of health, do you have any difficulty performing the following activities:  Hearing? 1  Comment medication causes ringing in ears  Vision? 1  Difficulty concentrating or making decisions? 1  Walking or climbing stairs? 0   Dressing or bathing? 1  Doing errands, shopping? 1  Comment husband Insurance claims handler and eating ? N  Using the Toilet? N  In the past six months, have you accidently leaked urine? Y  Do you have problems with loss of bowel control? N  Managing your Medications? Y  Managing your Finances? N  Housekeeping or managing your Housekeeping? Y    Patient Care Team: Wanda Plump, MD as PCP - General Allena Katz, Maryln Gottron, MD as Consulting Physician (Physical Medicine and Rehabilitation) Marvel Plan, MD as Consulting Physician (Neurology)  Indicate any recent Medical Services you may have received from other than [facility] providers in the past year (date may be approximate).     Assessment:   This is a routine wellness examination for Estha.  Hearing/Vision screen No results found.  Dietary issues and exercise activities discussed: Current Exercise Habits: The patient does not participate in regular exercise at present, Exercise limited by: neurologic condition(s);Other - see comments (history of stroke)   Goals Addressed   None    Depression Screen    04/22/2023   12:00 PM 03/04/2023   11:31 AM 01/05/2023    1:50 PM 12/15/2022   12:54 PM 12/11/2022   10:02 AM 10/01/2022   12:06 PM 08/19/2022    1:09 PM  PHQ 2/9 Scores  PHQ - 2 Score 2 0 0 0 0 0 0  PHQ- 9 Score  8 8 8   8     Fall Risk    04/22/2023   12:00 PM 04/22/2023    6:51 AM 03/04/2023   11:31 AM 02/11/2023   11:50 AM 01/05/2023    1:50 PM  Fall Risk  Falls in the past year? 1 1 0 0 1  Number falls in past yr: 0 1 0  0  Injury with Fall? 1 1 1  1   Risk for fall due to :  History of fall(s);Impaired balance/gait     Follow up  Falls evaluation completed Falls evaluation completed  Falls evaluation completed    FALL RISK PREVENTION PERTAINING TO THE HOME:  Any stairs in or around the home? No  Home free of loose throw rugs in walkways, pet beds, electrical cords, etc? Yes  Adequate lighting in your home to reduce risk of  falls? Yes   ASSISTIVE DEVICES UTILIZED TO PREVENT FALLS:  Life alert? No  Use of a cane, walker or w/c? Yes  Grab bars in the bathroom? Yes  Shower chair or bench in shower? No  Elevated toilet seat or a handicapped toilet? No   TIMED UP AND GO:  Was the test performed?  No, audio visit .    Cognitive Function:    07/23/2022    9:28 AM 06/11/2021   11:10 AM  MMSE - Mini Mental State Exam  Orientation to time 5 5  Orientation to Place 5 5  Registration 3 3  Attention/ Calculation 2 3  Recall 2 2  Language- name 2 objects 2 2  Language- repeat 1 1  Language- follow 3 step command 3 3  Language- read & follow direction 1 1  Write a sentence 1 1  Copy design 0 1  Total score 25 27        04/26/2023   10:28 AM 04/17/2022    8:25 AM  6CIT Screen  What Year? 0 points 0 points  What month? 0 points 0 points  What time? 0 points 0 points  Count back from 20 0 points 0 points  Months in reverse 0 points 0 points  Repeat phrase 2 points 0 points  Total Score 2 points 0 points    Immunizations Immunization History  Administered Date(s) Administered   COVID-19, mRNA, vaccine(Comirnaty)12 years and older 12/24/2022   Hepatitis B, ADULT 10/19/2014   Influenza,inj,Quad PF,6+ Mos 11/21/2013, 08/31/2016, 11/02/2017, 09/20/2018, 09/05/2020, 09/05/2021, 08/19/2022   PFIZER Comirnaty(Gray Top)Covid-19 Tri-Sucrose Vaccine 07/30/2021   PFIZER(Purple Top)SARS-COV-2 Vaccination 08/08/2020, 08/23/2020, 02/04/2021   PPD Test 10/17/2014   Pfizer Covid-19 Vaccine Bivalent Booster 22yrs & up 09/30/2021   Pneumococcal Conjugate-13 12/28/2016   Pneumococcal Polysaccharide-23 04/21/2018   Tdap 12/15/2007, 10/17/2014    TDAP status: Up to date  Flu Vaccine status: Up to date  Covid-19 vaccine status: Completed vaccines  Qualifies for Shingles Vaccine? No     Screening Tests Health Maintenance  Topic Date Due   Medicare Annual Wellness (AWV)  04/18/2023   INFLUENZA VACCINE   07/15/2023   MAMMOGRAM  02/02/2024   COLONOSCOPY (Pts 45-32yrs Insurance coverage will need to be confirmed)  10/05/2024   DTaP/Tdap/Td (3 - Td or Tdap) 10/17/2024   COVID-19 Vaccine  Completed   Hepatitis C Screening  Completed   HIV Screening  Completed   HPV VACCINES  Aged Out    Health Maintenance  Health Maintenance Due  Topic Date Due   Medicare Annual Wellness (AWV)  04/18/2023    Colorectal cancer screening: Type of screening: Colonoscopy. Completed 10/06/19. Repeat every 5 years  Mammogram status: Completed 02/01/23. Repeat every year  Lung Cancer Screening: (Low Dose CT Chest recommended if Age 76-80 years, 30 pack-year currently smoking OR have quit w/in 15years.) does not qualify.  Additional Screening:  Hepatitis C Screening: does qualify; Completed 05/20/21  Vision Screening: Recommended annual ophthalmology exams for early detection of glaucoma and other disorders of the eye. Is the patient up to date with their annual eye exam?  Yes  Who is the provider or what is the name of the office in which the patient attends annual eye exams? [facility] on [address] If pt is not established with a provider, would they like to be referred to a provider to establish care? No .   Dental Screening: Recommended annual dental exams for proper oral hygiene  Community Resource Referral / Chronic Care Management: CRR required this visit?  No   CCM required this visit?  No      Plan:     I have personally reviewed and noted the following in the patient's chart:   Medical and social history Use of alcohol, tobacco or illicit drugs  Current medications and supplements including opioid prescriptions. Patient is not currently taking opioid prescriptions. Functional ability and status Nutritional status Physical activity Advanced directives List of other physicians Hospitalizations, surgeries, and ER visits in previous 12 months Vitals Screenings to include cognitive,  depression, and falls Referrals and appointments  In addition, I have reviewed and discussed with patient certain preventive protocols, quality metrics, and best practice recommendations. A written personalized care plan for preventive services as well as general preventive health recommendations were provided to patient.   Due to this being a telephonic visit, the after visit summary with patients personalized plan was offered to patient via mail or my-chart.  Patient would like to access on my-chart.  Donne Anon, [place]   04/26/2023   Nurse Notes: None

## 2023-04-26 NOTE — Patient Instructions (Signed)
Abigail Campos , Thank you for taking time to come for your Medicare Wellness Visit. I appreciate your ongoing commitment to your health goals. Please review the following plan we discussed and let me know if I can assist you in the future.   These are the goals we discussed:  Goals   None     This is a list of the screening recommended for you and due dates:  Health Maintenance  Topic Date Due   Flu Shot  07/15/2023   Mammogram  02/02/2024   Medicare Annual Wellness Visit  04/25/2024   Colon Cancer Screening  10/05/2024   DTaP/Tdap/Td vaccine (3 - Td or Tdap) 10/17/2024   COVID-19 Vaccine  Completed   Hepatitis C Screening: USPSTF Recommendation to screen - Ages 77-79 yo.  Completed   HIV Screening  Completed   HPV Vaccine  Aged Out      Next appointment: Follow up in one year for your annual wellness visit.   Preventive Care 40-64 Years, Female Preventive care refers to lifestyle choices and visits with your health care provider that can promote health and wellness. What does preventive care include? A yearly physical exam. This is also called an annual well check. Dental exams once or twice a year. Routine eye exams. Ask your health care provider how often you should have your eyes checked. Personal lifestyle choices, including: Daily care of your teeth and gums. Regular physical activity. Eating a healthy diet. Avoiding tobacco and drug use. Limiting alcohol use. Practicing safe sex. Taking low-dose aspirin daily starting at age 53. Taking vitamin and mineral supplements as recommended by your health care provider. What happens during an annual well check? The services and screenings done by your health care provider during your annual well check will depend on your age, overall health, lifestyle risk factors, and family history of disease. Counseling  Your health care provider may ask you questions about your: Alcohol use. Tobacco use. Drug use. Emotional  well-being. Home and relationship well-being. Sexual activity. Eating habits. Work and work Astronomer. Method of birth control. Menstrual cycle. Pregnancy history. Screening  You may have the following tests or measurements: Height, weight, and BMI. Blood pressure. Lipid and cholesterol levels. These may be checked every 5 years, or more frequently if you are over 57 years old. Skin check. Lung cancer screening. You may have this screening every year starting at age 80 if you have a 30-pack-year history of smoking and currently smoke or have quit within the past 15 years. Fecal occult blood test (FOBT) of the stool. You may have this test every year starting at age 15. Flexible sigmoidoscopy or colonoscopy. You may have a sigmoidoscopy every 5 years or a colonoscopy every 10 years starting at age 69. Hepatitis C blood test. Hepatitis B blood test. Sexually transmitted disease (STD) testing. Diabetes screening. This is done by checking your blood sugar (glucose) after you have not eaten for a while (fasting). You may have this done every 1-3 years. Mammogram. This may be done every 1-2 years. Talk to your health care provider about when you should start having regular mammograms. This may depend on whether you have a family history of breast cancer. BRCA-related cancer screening. This may be done if you have a family history of breast, ovarian, tubal, or peritoneal cancers. Pelvic exam and Pap test. This may be done every 3 years starting at age 106. Starting at age 10, this may be done every 5 years if you have a  Pap test in combination with an HPV test. Bone density scan. This is done to screen for osteoporosis. You may have this scan if you are at high risk for osteoporosis. Discuss your test results, treatment options, and if necessary, the need for more tests with your health care provider. Vaccines  Your health care provider may recommend certain vaccines, such as: Influenza  vaccine. This is recommended every year. Tetanus, diphtheria, and acellular pertussis (Tdap, Td) vaccine. You may need a Td booster every 10 years. Zoster vaccine. You may need this after age 73. Pneumococcal 13-valent conjugate (PCV13) vaccine. You may need this if you have certain conditions and were not previously vaccinated. Pneumococcal polysaccharide (PPSV23) vaccine. You may need one or two doses if you smoke cigarettes or if you have certain conditions. Talk to your health care provider about which screenings and vaccines you need and how often you need them. This information is not intended to replace advice given to you by your health care provider. Make sure you discuss any questions you have with your health care provider. Document Released: 12/27/2015 Document Revised: 08/19/2016 Document Reviewed: 10/01/2015 Elsevier Interactive Patient Education  2017 [employer].    Fall Prevention in the Home Falls can cause injuries. They can happen to people of all ages. There are many things you can do to make your home safe and to help prevent falls. What can I do on the outside of my home? Regularly fix the edges of walkways and driveways and fix any cracks. Remove anything that might make you trip as you walk through a door, such as a raised step or threshold. Trim any bushes or trees on the path to your home. Use bright outdoor lighting. Clear any walking paths of anything that might make someone trip, such as rocks or tools. Regularly check to see if handrails are loose or broken. Make sure that both sides of any steps have handrails. Any raised decks and porches should have guardrails on the edges. Have any leaves, snow, or ice cleared regularly. Use sand or salt on walking paths during winter. Clean up any spills in your garage right away. This includes oil or grease spills. What can I do in the bathroom? Use night lights. Install grab bars by the toilet and in the tub and  shower. Do not use towel bars as grab bars. Use non-skid mats or decals in the tub or shower. If you need to sit down in the shower, use a plastic, non-slip stool. Keep the floor dry. Clean up any water that spills on the floor as soon as it happens. Remove soap buildup in the tub or shower regularly. Attach bath mats securely with double-sided non-slip rug tape. Do not have throw rugs and other things on the floor that can make you trip. What can I do in the bedroom? Use night lights. Make sure that you have a light by your bed that is easy to reach. Do not use any sheets or blankets that are too big for your bed. They should not hang down onto the floor. Have a firm chair that has side arms. You can use this for support while you get dressed. Do not have throw rugs and other things on the floor that can make you trip. What can I do in the kitchen? Clean up any spills right away. Avoid walking on wet floors. Keep items that you use a lot in easy-to-reach places. If you need to reach something above you, use a  strong step stool that has a grab bar. Keep electrical cords out of the way. Do not use floor polish or wax that makes floors slippery. If you must use wax, use non-skid floor wax. Do not have throw rugs and other things on the floor that can make you trip. What can I do with my stairs? Do not leave any items on the stairs. Make sure that there are handrails on both sides of the stairs and use them. Fix handrails that are broken or loose. Make sure that handrails are as long as the stairways. Check any carpeting to make sure that it is firmly attached to the stairs. Fix any carpet that is loose or worn. Avoid having throw rugs at the top or bottom of the stairs. If you do have throw rugs, attach them to the floor with carpet tape. Make sure that you have a light switch at the top of the stairs and the bottom of the stairs. If you do not have them, ask someone to add them for  you. What else can I do to help prevent falls? Wear shoes that: Do not have high heels. Have rubber bottoms. Are comfortable and fit you well. Are closed at the toe. Do not wear sandals. If you use a stepladder: Make sure that it is fully opened. Do not climb a closed stepladder. Make sure that both sides of the stepladder are locked into place. Ask someone to hold it for you, if possible. Clearly mark and make sure that you can see: Any grab bars or handrails. First and last steps. Where the edge of each step is. Use tools that help you move around (mobility aids) if they are needed. These include: Canes. Walkers. Scooters. Crutches. Turn on the lights when you go into a dark area. Replace any light bulbs as soon as they burn out. Set up your furniture so you have a clear path. Avoid moving your furniture around. If any of your floors are uneven, fix them. If there are any pets around you, be aware of where they are. Review your medicines with your doctor. Some medicines can make you feel dizzy. This can increase your chance of falling. Ask your doctor what other things that you can do to help prevent falls. This information is not intended to replace advice given to you by your health care provider. Make sure you discuss any questions you have with your health care provider. Document Released: 09/26/2009 Document Revised: 05/07/2016 Document Reviewed: 01/04/2015 Elsevier Interactive Patient Education  2017 [employer].

## 2023-05-04 ENCOUNTER — Telehealth: Payer: No Typology Code available for payment source | Admitting: Internal Medicine

## 2023-05-04 ENCOUNTER — Encounter: Payer: Self-pay | Admitting: Internal Medicine

## 2023-05-04 VITALS — BP 129/85 | Ht <= 58 in | Wt 130.0 lb

## 2023-05-04 DIAGNOSIS — I10 Essential (primary) hypertension: Secondary | ICD-10-CM

## 2023-05-04 DIAGNOSIS — Z780 Asymptomatic menopausal state: Secondary | ICD-10-CM | POA: Diagnosis not present

## 2023-05-04 NOTE — Progress Notes (Unsigned)
Subjective:    Patient ID: Abigail Campos, female    DOB: 10-31-1971, 52 y.o.   MRN: [id number]  DOS:  05/04/2023 Type of visit - description: Virtual Visit via Video Note  I connected with the above patient  by a video enabled telemedicine application and verified that I am speaking with the correct person using two identifiers.   Location of patient: home  Location of provider: office  Persons participating in the virtual visit: patient, provider   I discussed the limitations of evaluation and management by telemedicine and the availability of in person appointments. The patient expressed understanding and agreed to proceed.  Follow-up. Patient requested to change the visit to virtual. Chronic medical issues were addressed. In general she feels well. Denies chest pain or difficulty breathing. Good compliance with medications.  Review of Systems See above   Past Medical History:  Diagnosis Date   Allergy    Anemia    Anxiety and depression    Arthritis    Back pain    Blood transfusion without reported diagnosis    Essential hypertension 10/19/2007   03-2010: metoprolol changed to bystolic (was not feeling well on it, no specific allergy or reaction)     Food allergy    Hemiplegia (HCC)    Hyperlipemia    Hypertension    Joint pain    Migraine    Neuromuscular disorder (HCC)    left sided hemiplegia - has brace for l leg and walks with a cane   Obesity    Ovarian cancer (HCC)    Prediabetes    Sleep apnea    not wearing c-pap yet   Sleep apnea    Stroke (HCC) 2017   Vitamin D deficiency     Past Surgical History:  Procedure Laterality Date   ABDOMINAL HYSTERECTOMY  11/14/2007   no oophorectomy per surgical report   BREAST BIOPSY Right 02/08/2023   MM RT BREAST BX W LOC DEV 1ST LESION IMAGE BX SPEC STEREO GUIDE 02/08/2023 [facility]   CESAREAN SECTION     [id number]   HERNIA REPAIR     INGUINAL HERNIA REPAIR     2004    Current Outpatient  Medications  Medication Instructions   atorvastatin (LIPITOR) 20 mg, Oral, Daily   carvedilol (COREG) 25 MG tablet TAKE 1 TABLET (25 MG TOTAL) BY MOUTH TWICE A DAY WITH MEALS   clopidogrel (PLAVIX) 75 mg, Oral, Daily   escitalopram (LEXAPRO) 20 mg, Oral, Daily   losartan (COZAAR) 100 mg, Oral, Daily   prochlorperazine (COMPAZINE) 10 mg, Oral, 2 times daily PRN   tiZANidine (ZANAFLEX) 4 mg, Oral, 3 times daily   traZODone (DESYREL) 100 mg, Oral, At bedtime PRN   WEGOVY 0.25 MG/0.5ML SOAJ Subcutaneous   Wellbutrin XL 150 mg, Oral, Daily       Objective:   Physical Exam BP 129/85   Ht 4\' 10"  (1.473 m)   Wt 130 lb (59 kg)   BMI 27.17 kg/m  She is alert oriented x 3, in no distress, speech is at baseline.  She is sitting in the passenger's seat of her car.     Assessment    Assessment   Prediabetes: A1c 6.0 (April 2017)  HTN Hyperlipidemia Depression, insomnia, anxiety GERD Stroke 03-2016: Sequela (L)  spastic hemiplegia   ICH, right basilar ganglia due to HTN emergency, + encephalopathy, + dysphagia, aphasia, dysarthria Residual L spasticity.  CTA neck 03-2016 neg CTA head 10-17 (-) aneurysm Headaches , migraines  dx after 2nd pregnancy, (-) CT head 2014 and 2015 Cough, persisting, saw Dr. Sherene Sires 11-2015 OSA: Sleep study 08-2019, saw Dr. Maple Hudson, intolerant to CPAP    PLAN Patient requested to change  in-person OV to virtual. Pre - diabetes: Last A1c 5.5, on [facility] prescribed elsewhere. HTN: Current medications carvedilol, losartan, ambulatory BPs were in the 140s but in the last several days are in the 120s over 80.  Last potassium 3.4, minimally low.  No change, labs on RTC. Depression, insomnia, anxiety: Currently controlled on Lexapro, trazodone and Wellbutrin. Request a DEXA order sent. RTC 3 months      I discussed the assessment and treatment plan with the patient. The patient was provided an opportunity to ask questions and all were answered. The patient agreed with  the plan and demonstrated an understanding of the instructions.   The patient was advised to call back or seek an in-person evaluation if the symptoms worsen or if the condition fails to improve as anticipated.

## 2023-05-05 NOTE — Assessment & Plan Note (Signed)
Patient requested to change  in-person OV to virtual. Pre - diabetes: Last A1c 5.5, on [facility] prescribed elsewhere. HTN: Current medications carvedilol, losartan, ambulatory BPs were in the 140s but in the last several days are in the 120s over 80.  Last potassium 3.4, minimally low.  No change, labs on RTC. Depression, insomnia, anxiety: Currently controlled on Lexapro, trazodone and Wellbutrin. Request a DEXA order sent. RTC 3 months

## 2023-05-06 ENCOUNTER — Ambulatory Visit (HOSPITAL_BASED_OUTPATIENT_CLINIC_OR_DEPARTMENT_OTHER)
Admission: RE | Admit: 2023-05-06 | Discharge: 2023-05-06 | Disposition: A | Discharge status: Home / Self Care | Payer: No Typology Code available for payment source | Source: Ambulatory Visit | Attending: Internal Medicine | Admitting: Internal Medicine

## 2023-05-06 ENCOUNTER — Telehealth: Payer: Self-pay | Admitting: Internal Medicine

## 2023-05-06 DIAGNOSIS — Z780 Asymptomatic menopausal state: Secondary | ICD-10-CM | POA: Insufficient documentation

## 2023-05-06 MED ORDER — ALENDRONATE SODIUM 70 MG PO TABS: 70.0000 mg | ORAL_TABLET | ORAL | 11 refills | Status: DC

## 2023-05-06 NOTE — Telephone Encounter (Addendum)
T-score -3.2. Spoke with the patient, she does not have difficulties swallowing. Recommend to continue vitamin D. Start Fosamax weekly, precautions extensively discussed, she verbalized understanding. Prescription sent.

## 2023-05-20 ENCOUNTER — Encounter
Payer: No Typology Code available for payment source | Attending: Physical Medicine & Rehabilitation | Admitting: Physical Medicine & Rehabilitation

## 2023-05-20 ENCOUNTER — Encounter: Payer: Self-pay | Admitting: Physical Medicine & Rehabilitation

## 2023-05-20 VITALS — BP 161/103 | HR 73 | Ht <= 58 in | Wt 126.0 lb

## 2023-05-20 DIAGNOSIS — G8114 Spastic hemiplegia affecting left nondominant side: Secondary | ICD-10-CM | POA: Diagnosis not present

## 2023-05-20 MED ORDER — INCOBOTULINUMTOXINA 100 UNITS IM SOLR
200.0000 [IU] | Freq: Once | INTRAMUSCULAR | Status: AC
  Administered 2023-05-20: 200 [IU] via INTRAMUSCULAR

## 2023-05-20 NOTE — Addendum Note (Signed)
Addended by: Silas Sacramento T on: 05/20/2023 03:40 PM   Modules accepted: Orders

## 2023-05-20 NOTE — Patient Instructions (Signed)
You received a Xeomin injection today. You may experience soreness at the needle injection sites. Please call us if any of the injection sites turns red after a couple days or if there is any drainage. You may experience muscle weakness as a result of Xeomin This would improve with time but can take several weeks to improve. The Xeomin should start working in about one week. The Xeomin usually last 3 months. The injection can be repeated every 3 months as needed.  

## 2023-05-20 NOTE — Progress Notes (Signed)
Xeomin Injection for spasticity using needle EMG guidance  Dilution: 200 Units/ml Indication: Severe spasticity which interferes with ADL,mobility and/or  hygiene and is unresponsive to medication management and other conservative care Informed consent was obtained after describing risks and benefits of the procedure with the patient. This includes bleeding, bruising, infection, excessive weakness, or medication side effects. A REMS form is on file and signed. Needle:  needle electrode Number of units per muscle 200 units total Left FCR 50 units Left FDS 50 units Left FDP 50 units Left FPL 50 units  All injections were done after obtaining appropriate EMG activity and after negative drawback for blood. The patient tolerated the procedure well. Post procedure instructions were given. A followup appointment was made.   Will see pt back in 6 weeks, left ankle and foot tone looks ok today will reassess need for repeat tibial nerve neurolysis with phenol at that time.    

## 2023-05-26 DIAGNOSIS — S92352D Displaced fracture of fifth metatarsal bone, left foot, subsequent encounter for fracture with routine healing: Secondary | ICD-10-CM | POA: Diagnosis not present

## 2023-06-07 DIAGNOSIS — R262 Difficulty in walking, not elsewhere classified: Secondary | ICD-10-CM | POA: Diagnosis not present

## 2023-06-07 DIAGNOSIS — Z8639 Personal history of other endocrine, nutritional and metabolic disease: Secondary | ICD-10-CM | POA: Diagnosis not present

## 2023-06-07 DIAGNOSIS — I10 Essential (primary) hypertension: Secondary | ICD-10-CM | POA: Diagnosis not present

## 2023-06-07 DIAGNOSIS — Z6827 Body mass index (BMI) 27.0-27.9, adult: Secondary | ICD-10-CM | POA: Diagnosis not present

## 2023-06-07 DIAGNOSIS — E663 Overweight: Secondary | ICD-10-CM | POA: Diagnosis not present

## 2023-06-07 DIAGNOSIS — R7301 Impaired fasting glucose: Secondary | ICD-10-CM | POA: Diagnosis not present

## 2023-06-07 DIAGNOSIS — E041 Nontoxic single thyroid nodule: Secondary | ICD-10-CM | POA: Diagnosis not present

## 2023-06-07 DIAGNOSIS — K760 Fatty (change of) liver, not elsewhere classified: Secondary | ICD-10-CM | POA: Diagnosis not present

## 2023-06-07 DIAGNOSIS — Z8673 Personal history of transient ischemic attack (TIA), and cerebral infarction without residual deficits: Secondary | ICD-10-CM | POA: Diagnosis not present

## 2023-06-23 DIAGNOSIS — M546 Pain in thoracic spine: Secondary | ICD-10-CM | POA: Diagnosis not present

## 2023-06-23 DIAGNOSIS — S92352D Displaced fracture of fifth metatarsal bone, left foot, subsequent encounter for fracture with routine healing: Secondary | ICD-10-CM | POA: Diagnosis not present

## 2023-07-02 DIAGNOSIS — M25572 Pain in left ankle and joints of left foot: Secondary | ICD-10-CM | POA: Diagnosis not present

## 2023-07-02 DIAGNOSIS — M799 Soft tissue disorder, unspecified: Secondary | ICD-10-CM | POA: Diagnosis not present

## 2023-07-02 DIAGNOSIS — Z7409 Other reduced mobility: Secondary | ICD-10-CM | POA: Diagnosis not present

## 2023-07-02 DIAGNOSIS — M6281 Muscle weakness (generalized): Secondary | ICD-10-CM | POA: Diagnosis not present

## 2023-07-05 DIAGNOSIS — Z8673 Personal history of transient ischemic attack (TIA), and cerebral infarction without residual deficits: Secondary | ICD-10-CM | POA: Diagnosis not present

## 2023-07-05 DIAGNOSIS — E663 Overweight: Secondary | ICD-10-CM | POA: Diagnosis not present

## 2023-07-05 DIAGNOSIS — G8110 Spastic hemiplegia affecting unspecified side: Secondary | ICD-10-CM | POA: Diagnosis not present

## 2023-07-05 DIAGNOSIS — Z8639 Personal history of other endocrine, nutritional and metabolic disease: Secondary | ICD-10-CM | POA: Diagnosis not present

## 2023-07-05 DIAGNOSIS — K760 Fatty (change of) liver, not elsewhere classified: Secondary | ICD-10-CM | POA: Diagnosis not present

## 2023-07-05 DIAGNOSIS — R799 Abnormal finding of blood chemistry, unspecified: Secondary | ICD-10-CM | POA: Diagnosis not present

## 2023-07-05 DIAGNOSIS — Z6828 Body mass index (BMI) 28.0-28.9, adult: Secondary | ICD-10-CM | POA: Diagnosis not present

## 2023-07-05 DIAGNOSIS — I10 Essential (primary) hypertension: Secondary | ICD-10-CM | POA: Diagnosis not present

## 2023-07-05 LAB — CBC AND DIFFERENTIAL
HCT: 36 (ref 36–46)
Hemoglobin: 11.4 — AB (ref 12.0–16.0)
Neutrophils Absolute: 1.4
Platelets: 151 10*3/uL (ref 150–400)
WBC: 3.2

## 2023-07-05 LAB — CBC: RBC: 3.8 — AB (ref 3.87–5.11)

## 2023-07-06 ENCOUNTER — Encounter: Payer: Self-pay | Admitting: Internal Medicine

## 2023-07-06 ENCOUNTER — Ambulatory Visit (INDEPENDENT_AMBULATORY_CARE_PROVIDER_SITE_OTHER): Payer: No Typology Code available for payment source | Admitting: Internal Medicine

## 2023-07-06 VITALS — BP 138/82 | HR 62 | Temp 98.2°F | Resp 18 | Ht <= 58 in | Wt 137.0 lb

## 2023-07-06 DIAGNOSIS — I10 Essential (primary) hypertension: Secondary | ICD-10-CM

## 2023-07-06 MED ORDER — HYDROCHLOROTHIAZIDE 12.5 MG PO TABS: 12.5000 mg | ORAL_TABLET | Freq: Every day | ORAL | 3 refills | Status: DC

## 2023-07-06 NOTE — Progress Notes (Unsigned)
Subjective:    Patient ID: Abigail Campos, female    DOB: 20-Dec-1970, 52 y.o.   MRN: [id number]  DOS:  07/06/2023 Type of visit - description: Follow-up  Concerned about her BPs, readings have been elevated. Extremely concerned about her family history of heart disease. Denies chest pain or difficulty breathing.   Review of Systems See above   Past Medical History:  Diagnosis Date   Allergy    Anemia    Anxiety and depression    Arthritis    Back pain    Blood transfusion without reported diagnosis    Essential hypertension 10/19/2007   03-2010: metoprolol changed to bystolic (was not feeling well on it, no specific allergy or reaction)     Food allergy    Hemiplegia (HCC)    Hyperlipemia    Hypertension    Joint pain    Migraine    Neuromuscular disorder (HCC)    left sided hemiplegia - has brace for l leg and walks with a cane   Obesity    Ovarian cancer (HCC)    Prediabetes    Sleep apnea    not wearing c-pap yet   Sleep apnea    Stroke (HCC) 2017   Vitamin D deficiency     Past Surgical History:  Procedure Laterality Date   ABDOMINAL HYSTERECTOMY  11/14/2007   no oophorectomy per surgical report   BREAST BIOPSY Right 02/08/2023   MM RT BREAST BX W LOC DEV 1ST LESION IMAGE BX SPEC STEREO GUIDE 02/08/2023 [facility]   CESAREAN SECTION     [id number]   HERNIA REPAIR     INGUINAL HERNIA REPAIR     2004    Current Outpatient Medications  Medication Instructions   alendronate (FOSAMAX) 70 mg, Oral, Every 7 days, Take with a full glass of water on an empty stomach.   atorvastatin (LIPITOR) 20 mg, Oral, Daily   carvedilol (COREG) 25 MG tablet TAKE 1 TABLET (25 MG TOTAL) BY MOUTH TWICE A DAY WITH MEALS   clopidogrel (PLAVIX) 75 mg, Oral, Daily   escitalopram (LEXAPRO) 20 mg, Oral, Daily   losartan (COZAAR) 100 mg, Oral, Daily   prochlorperazine (COMPAZINE) 10 mg, Oral, 2 times daily PRN   tiZANidine (ZANAFLEX) 4 mg, Oral, 3 times daily    traZODone (DESYREL) 100 mg, Oral, At bedtime PRN   WEGOVY 0.25 MG/0.5ML SOAJ Subcutaneous   Wellbutrin XL 150 mg, Oral, Daily       Objective:   Physical Exam BP (!) 144/90   Pulse 62   Temp 98.2 F (36.8 C) (Oral)   Resp 18   Ht 4\' 10"  (1.473 m)   Wt 137 lb (62.1 kg)   BMI 28.63 kg/m  General:   Well developed, NAD, BMI noted. HEENT:  Normocephalic . Face symmetric, atraumatic Lungs:  CTA B Normal respiratory effort, no intercostal retractions, no accessory muscle use. Heart: RRR,  no murmur.  Lower extremities: no pretibial edema bilaterally  Skin: Not pale. Not jaundice Neurologic: At baseline   Psych--  Cognition and judgment appear intact.  Cooperative with normal attention span and concentration.  Behavior appropriate. No anxious or depressed appearing.      Assessment   Assessment   Prediabetes: A1c 6.0 (April 2017)  HTN Hyperlipidemia Depression, insomnia, anxiety GERD Stroke 03-2016: Sequela (L)  spastic hemiplegia   ICH, right basilar ganglia due to HTN emergency, + encephalopathy, + dysphagia, aphasia, dysarthria Residual L spasticity.  CTA neck 03-2016 neg CTA head  10-17 (-) aneurysm Headaches , migraines dx after 2nd pregnancy, (-) CT head 2014 and 2015 OSA: Sleep study 08-2019, saw Dr. Maple Hudson, intolerant to CPAP  Osteoporosis.  T-score 04/2023 -3.2.  Rx Fosamax Vitamin D deficiency   PLAN HTN: On carvedilol, losartan.  Ambulatory BPs consistently in the 140s over 90s. On October 2022, Lasix was discontinued due to low BPs. Plan: Add HCTZ 12.5 mg daily, reassess in 10 days, patient to send me a message with BP readings, increase to 25 mg?.  BMP in 2 to 3 weeks. FH CAD: Multiple family members had CAD including grandfather, grandmother, uncles and aunts.  Father and mother not affected.  Patient has no symptoms, on Plavix, excellent cholesterol control, working on BP control. She wonders about a cardiology referral, we had a long discussion about it  and agreed to reassess on RTC.  If she remains concerned after her blood pressure is under better control will refer to cards. Osteoporosis, T-score May 2024: -3.2, Rx Fosamax, tolerates well. Sees Dr. Earlene Plater for weight management, get records, they do walk around frequently. RTC labs 2 to 3 weeks RTC office visit 3 months === Patient requested to change  in-person OV to virtual. Pre - diabetes: Last A1c 5.5, on [facility] prescribed elsewhere. HTN: Current medications carvedilol, losartan, ambulatory BPs were in the 140s but in the last several days are in the 120s over 80.  Last potassium 3.4, minimally low.  No change, labs on RTC. Depression, insomnia, anxiety: Currently controlled on Lexapro, trazodone and Wellbutrin. Request a DEXA order sent. RTC 3 months

## 2023-07-06 NOTE — Patient Instructions (Addendum)
Add hydrochlorothiazide 12.5 mg 1 tablet every morning.  This will help your blood pressure.  Check the  blood pressure every day BP GOAL is between 110/65 and  135/85. If it is consistently higher or lower, let me know  Send me a message in 7 to 10 days and let me know how your blood pressure is doing.         GO TO THE FRONT DESK, PLEASE SCHEDULE YOUR APPOINTMENTS Come back for   blood work in 2 to 3 weeks  Come back for a checkup in 3 months.   Please bring or send [place] a copy of your Healthcare Power of Attorney for your chart.

## 2023-07-07 ENCOUNTER — Encounter: Payer: Self-pay | Admitting: Internal Medicine

## 2023-07-07 DIAGNOSIS — I10 Essential (primary) hypertension: Secondary | ICD-10-CM

## 2023-07-07 DIAGNOSIS — Z7409 Other reduced mobility: Secondary | ICD-10-CM | POA: Diagnosis not present

## 2023-07-07 DIAGNOSIS — M799 Soft tissue disorder, unspecified: Secondary | ICD-10-CM | POA: Diagnosis not present

## 2023-07-07 DIAGNOSIS — M6281 Muscle weakness (generalized): Secondary | ICD-10-CM | POA: Diagnosis not present

## 2023-07-07 DIAGNOSIS — M25572 Pain in left ankle and joints of left foot: Secondary | ICD-10-CM | POA: Diagnosis not present

## 2023-07-07 DIAGNOSIS — D72819 Decreased white blood cell count, unspecified: Secondary | ICD-10-CM

## 2023-07-07 NOTE — Assessment & Plan Note (Signed)
HTN: On carvedilol, losartan.  Ambulatory BPs consistently in the 140s over 90s. On October 2022, Lasix was discontinued due to low BPs. Plan: Add HCTZ 12.5 mg daily, reassess in 10 days, patient to send me a message with BP readings, increase to 25 mg?.  BMP in 2 to 3 weeks. FH CAD: Multiple family members had CAD including grandfather, grandmother, uncles and aunts however, her father and mother did not have heart issues. Pt w/ no sxs ,  on Plavix, excellent cholesterol control, working on BP control. She wonders about a cardiology referral, we had a long discussion about it and agreed to reassess on RTC.  If she remains concerned will refer to cards. Osteoporosis, T-score May 2024: -3.2, Rx Fosamax, tolerates well. Sees Dr. Earlene Plater for weight management, get records, they do walk around frequently. RTC labs 2 to 3 weeks RTC office visit 3 months

## 2023-07-08 ENCOUNTER — Encounter: Payer: Self-pay | Admitting: Internal Medicine

## 2023-07-08 NOTE — Telephone Encounter (Signed)
Labs abstracted and placed in PCP blue folder for review.

## 2023-07-09 NOTE — Telephone Encounter (Signed)
Add a  CBC to future labs to be done in 2 to 3 weeks.  DX leukopenia

## 2023-07-09 NOTE — Telephone Encounter (Signed)
Order placed

## 2023-07-09 NOTE — Addendum Note (Signed)
Addended byConrad  D on: 07/09/2023 11:30 AM   Modules accepted: Orders

## 2023-07-13 ENCOUNTER — Other Ambulatory Visit: Payer: Self-pay | Admitting: Physical Medicine & Rehabilitation

## 2023-07-14 NOTE — Telephone Encounter (Signed)
Baclofen is not on patient medication list.

## 2023-07-27 ENCOUNTER — Encounter: Payer: Self-pay | Admitting: Physical Medicine & Rehabilitation

## 2023-07-27 ENCOUNTER — Other Ambulatory Visit (INDEPENDENT_AMBULATORY_CARE_PROVIDER_SITE_OTHER): Payer: Medicare Other

## 2023-07-27 DIAGNOSIS — D72819 Decreased white blood cell count, unspecified: Secondary | ICD-10-CM | POA: Diagnosis not present

## 2023-07-27 DIAGNOSIS — I10 Essential (primary) hypertension: Secondary | ICD-10-CM

## 2023-07-27 LAB — CBC WITH DIFFERENTIAL/PLATELET
Basophils Absolute: 0 10*3/uL (ref 0.0–0.1)
Basophils Relative: 0.2 % (ref 0.0–3.0)
Eosinophils Absolute: 0 10*3/uL (ref 0.0–0.7)
Eosinophils Relative: 0.4 % (ref 0.0–5.0)
HCT: 38.7 % (ref 36.0–46.0)
Hemoglobin: 12.3 g/dL (ref 12.0–15.0)
Lymphocytes Relative: 32 % (ref 12.0–46.0)
Lymphs Abs: 1.2 10*3/uL (ref 0.7–4.0)
MCHC: 31.9 g/dL (ref 30.0–36.0)
MCV: 95.3 fl (ref 78.0–100.0)
Monocytes Absolute: 0.5 10*3/uL (ref 0.1–1.0)
Monocytes Relative: 11.6 % (ref 3.0–12.0)
Neutro Abs: 2.2 10*3/uL (ref 1.4–7.7)
Neutrophils Relative %: 55.8 % (ref 43.0–77.0)
Platelets: 155 10*3/uL (ref 150.0–400.0)
RBC: 4.06 Mil/uL (ref 3.87–5.11)
RDW: 14.8 % (ref 11.5–15.5)
WBC: 3.9 10*3/uL — ABNORMAL LOW (ref 4.0–10.5)

## 2023-07-27 LAB — BASIC METABOLIC PANEL
BUN: 22 mg/dL (ref 6–23)
CO2: 30 mEq/L (ref 19–32)
Calcium: 9.3 mg/dL (ref 8.4–10.5)
Chloride: 101 mEq/L (ref 96–112)
Creatinine, Ser: 1.26 mg/dL — ABNORMAL HIGH (ref 0.40–1.20)
GFR: 50.05 mL/min — ABNORMAL LOW (ref >60.00)
Glucose, Bld: 96 mg/dL (ref 70–99)
Potassium: 4.2 mEq/L (ref 3.5–5.1)
Sodium: 138 mEq/L (ref 135–145)

## 2023-07-28 ENCOUNTER — Encounter: Payer: Self-pay | Admitting: Internal Medicine

## 2023-07-28 DIAGNOSIS — M799 Soft tissue disorder, unspecified: Secondary | ICD-10-CM | POA: Diagnosis not present

## 2023-07-28 DIAGNOSIS — M25572 Pain in left ankle and joints of left foot: Secondary | ICD-10-CM | POA: Diagnosis not present

## 2023-07-28 DIAGNOSIS — M6281 Muscle weakness (generalized): Secondary | ICD-10-CM | POA: Diagnosis not present

## 2023-07-28 DIAGNOSIS — Z7409 Other reduced mobility: Secondary | ICD-10-CM | POA: Diagnosis not present

## 2023-07-29 ENCOUNTER — Telehealth: Payer: Self-pay | Admitting: Internal Medicine

## 2023-07-29 ENCOUNTER — Other Ambulatory Visit: Payer: Self-pay

## 2023-07-29 ENCOUNTER — Other Ambulatory Visit (HOSPITAL_BASED_OUTPATIENT_CLINIC_OR_DEPARTMENT_OTHER): Payer: Self-pay

## 2023-07-29 DIAGNOSIS — I10 Essential (primary) hypertension: Secondary | ICD-10-CM

## 2023-07-29 MED ORDER — AMLODIPINE BESYLATE 5 MG PO TABS
5.0000 mg | ORAL_TABLET | Freq: Every day | ORAL | 1 refills | Status: DC
Start: 2023-07-29 — End: 2023-10-09
  Filled 2023-07-29: qty 30, 30d supply, fill #0
  Filled 2023-09-01: qty 30, 30d supply, fill #1

## 2023-07-29 NOTE — Telephone Encounter (Signed)
Pt aware and voices understanding.   Rx was sent to [facility] since the pt is selfpay and this Rx is only $5/30 days. She has also been scheduled for her NV.

## 2023-07-29 NOTE — Telephone Encounter (Signed)
Left vm to return call.    

## 2023-07-29 NOTE — Telephone Encounter (Signed)
At last office visit, HCTZ 12.5 mg was added for better BP control follow-up creatinine increased from 0.8  to 1.26. BP yesterday according to the patient 143/90. Please call patient, plan:  Stop HCTZ Start amlodipine 5 mg 1 tablet daily. Send a 27-month supply Patient to have a nurse visit for BP check in 3 weeks.

## 2023-07-29 NOTE — Telephone Encounter (Signed)
See phone note

## 2023-08-02 ENCOUNTER — Ambulatory Visit: Payer: No Typology Code available for payment source | Admitting: Neurology

## 2023-08-04 ENCOUNTER — Ambulatory Visit (INDEPENDENT_AMBULATORY_CARE_PROVIDER_SITE_OTHER): Payer: Medicare Other

## 2023-08-04 ENCOUNTER — Other Ambulatory Visit (HOSPITAL_BASED_OUTPATIENT_CLINIC_OR_DEPARTMENT_OTHER): Payer: Self-pay

## 2023-08-04 DIAGNOSIS — I10 Essential (primary) hypertension: Secondary | ICD-10-CM

## 2023-08-04 NOTE — Progress Notes (Signed)
Pt here for Blood pressure check per [facility]  Pt currently takes:  Amlodipine 5 mg, once daily, (pt states not taking yet) Carvedilol 25 mg twice daily,  Losartan 100 mg once daily   Pt reports compliance with medication.  BP today @ =  130/90 HR = 69  Pt advised per Dr. Drue Novel to start new medication and check blood pressures at home. If blood pressures are under control with new medication patient can follow up at schedule appt on 10/06/23. If blood pressure remains high to schedule a sooner appt.

## 2023-08-10 ENCOUNTER — Encounter: Payer: Self-pay | Admitting: Internal Medicine

## 2023-08-10 DIAGNOSIS — D61818 Other pancytopenia: Secondary | ICD-10-CM | POA: Diagnosis not present

## 2023-08-10 DIAGNOSIS — Z5941 Food insecurity: Secondary | ICD-10-CM | POA: Diagnosis not present

## 2023-08-10 DIAGNOSIS — D649 Anemia, unspecified: Secondary | ICD-10-CM | POA: Diagnosis not present

## 2023-08-10 DIAGNOSIS — M6281 Muscle weakness (generalized): Secondary | ICD-10-CM | POA: Diagnosis not present

## 2023-08-10 DIAGNOSIS — D72819 Decreased white blood cell count, unspecified: Secondary | ICD-10-CM | POA: Diagnosis not present

## 2023-08-10 DIAGNOSIS — M25572 Pain in left ankle and joints of left foot: Secondary | ICD-10-CM | POA: Diagnosis not present

## 2023-08-10 DIAGNOSIS — M799 Soft tissue disorder, unspecified: Secondary | ICD-10-CM | POA: Diagnosis not present

## 2023-08-10 DIAGNOSIS — Z7409 Other reduced mobility: Secondary | ICD-10-CM | POA: Diagnosis not present

## 2023-08-12 DIAGNOSIS — R632 Polyphagia: Secondary | ICD-10-CM | POA: Diagnosis not present

## 2023-08-12 DIAGNOSIS — Z8673 Personal history of transient ischemic attack (TIA), and cerebral infarction without residual deficits: Secondary | ICD-10-CM | POA: Diagnosis not present

## 2023-08-12 DIAGNOSIS — D72819 Decreased white blood cell count, unspecified: Secondary | ICD-10-CM | POA: Diagnosis not present

## 2023-08-12 DIAGNOSIS — E663 Overweight: Secondary | ICD-10-CM | POA: Diagnosis not present

## 2023-08-12 DIAGNOSIS — I10 Essential (primary) hypertension: Secondary | ICD-10-CM | POA: Diagnosis not present

## 2023-08-13 DIAGNOSIS — M6281 Muscle weakness (generalized): Secondary | ICD-10-CM | POA: Diagnosis not present

## 2023-08-13 DIAGNOSIS — Z7409 Other reduced mobility: Secondary | ICD-10-CM | POA: Diagnosis not present

## 2023-08-13 DIAGNOSIS — M25572 Pain in left ankle and joints of left foot: Secondary | ICD-10-CM | POA: Diagnosis not present

## 2023-08-13 DIAGNOSIS — M799 Soft tissue disorder, unspecified: Secondary | ICD-10-CM | POA: Diagnosis not present

## 2023-08-18 DIAGNOSIS — Z7409 Other reduced mobility: Secondary | ICD-10-CM | POA: Diagnosis not present

## 2023-08-18 DIAGNOSIS — M6281 Muscle weakness (generalized): Secondary | ICD-10-CM | POA: Diagnosis not present

## 2023-08-18 DIAGNOSIS — M25572 Pain in left ankle and joints of left foot: Secondary | ICD-10-CM | POA: Diagnosis not present

## 2023-08-18 DIAGNOSIS — M799 Soft tissue disorder, unspecified: Secondary | ICD-10-CM | POA: Diagnosis not present

## 2023-08-20 ENCOUNTER — Encounter: Payer: Self-pay | Admitting: Physical Medicine & Rehabilitation

## 2023-08-20 ENCOUNTER — Encounter: Payer: BC Managed Care – PPO | Admitting: Physical Medicine & Rehabilitation

## 2023-08-20 VITALS — BP 120/81 | HR 72 | Ht <= 58 in | Wt 137.0 lb

## 2023-08-20 MED ORDER — INCOBOTULINUMTOXINA 100 UNITS IM SOLR
100.0000 [IU] | Freq: Once | INTRAMUSCULAR | Status: AC
Start: 2023-08-20 — End: ?

## 2023-08-20 NOTE — Progress Notes (Unsigned)
Xeomin Injection for spasticity using needle EMG guidance  Dilution: 200 Units/ml Indication: Severe spasticity which interferes with ADL,mobility and/or  hygiene and is unresponsive to medication management and other conservative care Informed consent was obtained after describing risks and benefits of the procedure with the patient. This includes bleeding, bruising, infection, excessive weakness, or medication side effects. A REMS form is on file and signed. Needle:  needle electrode Number of units per muscle 200 units total Left FCR 50 units Left FDS 50 units Left FDP 50 units Left FPL 50 units  All injections were done after obtaining appropriate EMG activity and after negative drawback for blood. The patient tolerated the procedure well. Post procedure instructions were given. A followup appointment was made.   Will see pt back in 6 weeks, left ankle and foot tone looks ok today will reassess need for repeat tibial nerve neurolysis with phenol at that time.

## 2023-08-23 DIAGNOSIS — M25572 Pain in left ankle and joints of left foot: Secondary | ICD-10-CM | POA: Diagnosis not present

## 2023-08-23 DIAGNOSIS — Z7409 Other reduced mobility: Secondary | ICD-10-CM | POA: Diagnosis not present

## 2023-08-23 DIAGNOSIS — M6281 Muscle weakness (generalized): Secondary | ICD-10-CM | POA: Diagnosis not present

## 2023-08-23 DIAGNOSIS — M799 Soft tissue disorder, unspecified: Secondary | ICD-10-CM | POA: Diagnosis not present

## 2023-08-24 ENCOUNTER — Ambulatory Visit: Payer: Medicare Other | Admitting: Physical Medicine & Rehabilitation

## 2023-08-25 DIAGNOSIS — M6281 Muscle weakness (generalized): Secondary | ICD-10-CM | POA: Diagnosis not present

## 2023-08-25 DIAGNOSIS — M799 Soft tissue disorder, unspecified: Secondary | ICD-10-CM | POA: Diagnosis not present

## 2023-08-25 DIAGNOSIS — M25572 Pain in left ankle and joints of left foot: Secondary | ICD-10-CM | POA: Diagnosis not present

## 2023-08-25 DIAGNOSIS — Z7409 Other reduced mobility: Secondary | ICD-10-CM | POA: Diagnosis not present

## 2023-08-30 DIAGNOSIS — M25572 Pain in left ankle and joints of left foot: Secondary | ICD-10-CM | POA: Diagnosis not present

## 2023-08-30 DIAGNOSIS — M6281 Muscle weakness (generalized): Secondary | ICD-10-CM | POA: Diagnosis not present

## 2023-08-30 DIAGNOSIS — Z7409 Other reduced mobility: Secondary | ICD-10-CM | POA: Diagnosis not present

## 2023-08-30 DIAGNOSIS — M799 Soft tissue disorder, unspecified: Secondary | ICD-10-CM | POA: Diagnosis not present

## 2023-09-02 ENCOUNTER — Other Ambulatory Visit (HOSPITAL_BASED_OUTPATIENT_CLINIC_OR_DEPARTMENT_OTHER): Payer: Self-pay

## 2023-09-03 ENCOUNTER — Other Ambulatory Visit (HOSPITAL_BASED_OUTPATIENT_CLINIC_OR_DEPARTMENT_OTHER): Payer: Self-pay

## 2023-09-03 ENCOUNTER — Encounter (HOSPITAL_BASED_OUTPATIENT_CLINIC_OR_DEPARTMENT_OTHER): Payer: Self-pay

## 2023-09-06 ENCOUNTER — Other Ambulatory Visit (HOSPITAL_BASED_OUTPATIENT_CLINIC_OR_DEPARTMENT_OTHER): Payer: Self-pay

## 2023-09-06 MED ORDER — WEGOVY 1.7 MG/0.75ML ~~LOC~~ SOAJ: 1.7000 mg | SUBCUTANEOUS | 0 refills | Status: DC

## 2023-09-06 MED ORDER — SEMAGLUTIDE-WEIGHT MANAGEMENT 2.4 MG/0.75ML ~~LOC~~ SOAJ
2.4000 mg | SUBCUTANEOUS | 1 refills | Status: DC
  Filled 2023-09-29 – 2023-11-16 (×2): qty 3, 28d supply, fill #0

## 2023-09-06 MED ORDER — SEMAGLUTIDE-WEIGHT MANAGEMENT 1 MG/0.5ML ~~LOC~~ SOAJ: 1.0000 mg | SUBCUTANEOUS | 0 refills | Status: DC

## 2023-09-06 MED ORDER — ALENDRONATE SODIUM 70 MG PO TABS
70.0000 mg | ORAL_TABLET | ORAL | 11 refills | Status: AC
Start: 2023-05-06 — End: ?
  Filled 2023-10-10: qty 4, 28d supply, fill #0
  Filled 2023-11-16: qty 4, 28d supply, fill #1

## 2023-09-06 MED ORDER — CHOLECALCIFEROL 1.25 MG (50000 UT) PO CAPS
50000.0000 [IU] | ORAL_CAPSULE | ORAL | 3 refills | Status: DC
Start: 2023-07-13 — End: 2024-08-30
  Filled 2023-10-10: qty 12, 84d supply, fill #0
  Filled 2024-02-16: qty 12, 84d supply, fill #1
  Filled 2024-05-10: qty 4, 28d supply, fill #2
  Filled 2024-06-20: qty 4, 28d supply, fill #3

## 2023-09-06 MED ORDER — BUPROPION HCL ER (XL) 150 MG PO TB24
150.0000 mg | ORAL_TABLET | Freq: Every morning | ORAL | 1 refills | Status: DC
  Filled 2023-09-29: qty 90, 90d supply, fill #0

## 2023-09-06 MED ORDER — HYDROCHLOROTHIAZIDE 12.5 MG PO TABS: 12.5000 mg | ORAL_TABLET | Freq: Every day | ORAL | 1 refills | Status: DC

## 2023-09-10 ENCOUNTER — Other Ambulatory Visit: Payer: Self-pay | Admitting: Internal Medicine

## 2023-09-10 ENCOUNTER — Encounter: Payer: BC Managed Care – PPO | Admitting: Physical Medicine & Rehabilitation

## 2023-09-10 ENCOUNTER — Emergency Department (HOSPITAL_BASED_OUTPATIENT_CLINIC_OR_DEPARTMENT_OTHER)
Admission: EM | Admit: 2023-09-10 | Discharge: 2023-09-10 | Disposition: A | Discharge status: Home / Self Care | Payer: BC Managed Care – PPO | Attending: Emergency Medicine | Admitting: Emergency Medicine

## 2023-09-10 ENCOUNTER — Other Ambulatory Visit (HOSPITAL_BASED_OUTPATIENT_CLINIC_OR_DEPARTMENT_OTHER): Payer: Self-pay

## 2023-09-10 ENCOUNTER — Encounter (HOSPITAL_BASED_OUTPATIENT_CLINIC_OR_DEPARTMENT_OTHER): Payer: Self-pay

## 2023-09-10 ENCOUNTER — Encounter: Payer: Self-pay | Admitting: Physical Medicine & Rehabilitation

## 2023-09-10 ENCOUNTER — Other Ambulatory Visit: Payer: Self-pay

## 2023-09-10 VITALS — BP 95/66 | HR 90 | Ht <= 58 in | Wt 137.0 lb

## 2023-09-10 DIAGNOSIS — Z7901 Long term (current) use of anticoagulants: Secondary | ICD-10-CM | POA: Insufficient documentation

## 2023-09-10 DIAGNOSIS — Z8543 Personal history of malignant neoplasm of ovary: Secondary | ICD-10-CM | POA: Diagnosis not present

## 2023-09-10 DIAGNOSIS — I10 Essential (primary) hypertension: Secondary | ICD-10-CM | POA: Diagnosis not present

## 2023-09-10 DIAGNOSIS — Z79899 Other long term (current) drug therapy: Secondary | ICD-10-CM | POA: Diagnosis not present

## 2023-09-10 DIAGNOSIS — R031 Nonspecific low blood-pressure reading: Secondary | ICD-10-CM | POA: Diagnosis not present

## 2023-09-10 DIAGNOSIS — D72819 Decreased white blood cell count, unspecified: Secondary | ICD-10-CM | POA: Diagnosis not present

## 2023-09-10 DIAGNOSIS — G479 Sleep disorder, unspecified: Secondary | ICD-10-CM | POA: Diagnosis not present

## 2023-09-10 LAB — CBC WITH DIFFERENTIAL/PLATELET
Abs Immature Granulocytes: 0.02 10*3/uL (ref 0.00–0.07)
Basophils Absolute: 0 10*3/uL (ref 0.0–0.1)
Basophils Relative: 0 %
Eosinophils Absolute: 0 10*3/uL (ref 0.0–0.5)
Eosinophils Relative: 1 %
HCT: 36.6 % (ref 36.0–46.0)
Hemoglobin: 11.8 g/dL — ABNORMAL LOW (ref 12.0–15.0)
Immature Granulocytes: 1 %
Lymphocytes Relative: 34 %
Lymphs Abs: 1.3 10*3/uL (ref 0.7–4.0)
MCH: 30.6 pg (ref 26.0–34.0)
MCHC: 32.2 g/dL (ref 30.0–36.0)
MCV: 95.1 fL (ref 80.0–100.0)
Monocytes Absolute: 0.3 10*3/uL (ref 0.1–1.0)
Monocytes Relative: 9 %
Neutro Abs: 2 10*3/uL (ref 1.7–7.7)
Neutrophils Relative %: 55 %
Platelets: 153 10*3/uL (ref 150–400)
RBC: 3.85 MIL/uL — ABNORMAL LOW (ref 3.87–5.11)
RDW: 13.4 % (ref 11.5–15.5)
WBC: 3.6 10*3/uL — ABNORMAL LOW (ref 4.0–10.5)
nRBC: 0 % (ref 0.0–0.2)

## 2023-09-10 LAB — BASIC METABOLIC PANEL
Anion gap: 7 (ref 5–15)
BUN: 7 mg/dL (ref 6–20)
CO2: 30 mmol/L (ref 22–32)
Calcium: 9.2 mg/dL (ref 8.9–10.3)
Chloride: 103 mmol/L (ref 98–111)
Creatinine, Ser: 1.02 mg/dL — ABNORMAL HIGH (ref 0.44–1.00)
GFR, Estimated: >60 mL/min (ref >60)
Glucose, Bld: 85 mg/dL (ref 70–99)
Potassium: 4 mmol/L (ref 3.5–5.1)
Sodium: 140 mmol/L (ref 135–145)

## 2023-09-10 MED ORDER — SODIUM CHLORIDE 0.9 % IV BOLUS
1000.0000 mL | Freq: Once | INTRAVENOUS | Status: AC
  Administered 2023-09-10: 1000 mL via INTRAVENOUS

## 2023-09-10 MED ORDER — INCOBOTULINUMTOXINA 100 UNITS IM SOLR
200.0000 [IU] | Freq: Once | INTRAMUSCULAR | Status: AC
Start: 2023-09-10 — End: ?

## 2023-09-10 MED FILL — Escitalopram Oxalate Tab 20 MG (Base Equiv): ORAL | 90 days supply | Qty: 90 | Fill #0 | Status: AC

## 2023-09-10 NOTE — Progress Notes (Unsigned)
Xeomin Injection for spasticity using needle EMG guidance  Dilution: 200 Units/ml Indication: Severe spasticity which interferes with ADL,mobility and/or  hygiene and is unresponsive to medication management and other conservative care Informed consent was obtained after describing risks and benefits of the procedure with the patient. This includes bleeding, bruising, infection, excessive weakness, or medication side effects. A REMS form is on file and signed. Needle:  needle electrode Number of units per muscle 200 units total Left FCR 50 units Left FDS 50 units Left FDP 50 units Left FPL 50 units  All injections were done after obtaining appropriate EMG activity and after negative drawback for blood. The patient tolerated the procedure well. Post procedure instructions were given. A followup appointment was made.   Will see pt back in 6 weeks, left ankle and foot tone looks ok today will reassess need for repeat tibial nerve neurolysis with phenol at that time.

## 2023-09-10 NOTE — Discharge Instructions (Signed)
Consider trialing melatonin prior to bedtime.  Consider pushing back your bedtime by an hour daily until you are back into a normal sleep rhythm.  Avoid electronics or strenuous physical activity or caffeine beverages leading up to bedtime.  Please follow up PCP  It was a pleasure caring for you today in the emergency department.  Please return to the emergency department for any worsening or worrisome symptoms.

## 2023-09-10 NOTE — ED Provider Notes (Addendum)
[place] [facility] AT [facility] Provider Note  [name]: [id number] Arrival date & time: 09/10/23 1218  Chief Complaint(s) Hypotension  HPI Abigail Campos is a 52 y.o. female with past medical history as below, significant for hypertension, HLD, migraine, left-sided hemiplegia with leg brace, CVA, OSA who presents to the ED with complaint of low blood pressure  She was seen earlier today outpatient clinic, she was reported to have low blood pressure on evaluation.  She was asymptomatic.  Did have episode of loose stool while at the clinic.  No chest pain, dyspnea, nausea or vomiting.  Compliant with home medications.  No recent medication or diet changes.  Patient also reports difficulty sleeping over the past month or 2, reports that she is staying up all night and sleeping throughout the day.  She was trialed on trazodone but does not seem to be helping. Daytime fatigue a/w staying awake all night and sleeping throughout the day    Past Medical History Past Medical History:  Diagnosis Date   Allergy    Anemia    Anxiety and depression    Arthritis    Back pain    Blood transfusion without reported diagnosis    Essential hypertension 10/19/2007   03-2010: metoprolol changed to bystolic (was not feeling well on it, no specific allergy or reaction)     Food allergy    Hemiplegia (HCC)    Hyperlipemia    Hypertension    Joint pain    Migraine    Neuromuscular disorder (HCC)    left sided hemiplegia - has brace for l leg and walks with a cane   Obesity    Ovarian cancer (HCC)    Prediabetes    Sleep apnea    not wearing c-pap yet   Sleep apnea    Stroke (HCC) 2017   Vitamin D deficiency    Patient Active Problem List   Diagnosis Date Noted   Depression, major, single episode, severe (HCC) 07/23/2022   Vitamin D deficiency 07/06/2021   History of CVA (cerebrovascular accident) 07/06/2021   Other constipation 03/18/2021   Hot flashes 03/17/2021    Prediabetes 02/11/2021   Neuropathic pain 09/19/2020   Adjustment disorder with mixed anxiety and depressed mood 09/19/2020   Abnormality of gait 05/09/2020   Hemiplegia of nondominant side, late effect of cerebrovascular disease (HCC) 03/28/2020   Spastic hemiplegia of left nondominant side due to nontraumatic intraparenchymal hemorrhage of brain (HCC) 12/26/2019   Trigger thumb of right hand 11/02/2019   OSA (obstructive sleep apnea) 07/26/2019   Spastic hemiplegia of left nondominant side as late effect of nontraumatic intraparenchymal hemorrhage of brain (HCC) 04/06/2019   Spastic hemiparesis affecting dominant side (HCC) 01/04/2018   Hyperglycemia 12/29/2016   Muscle spasticity    Hyperlipidemia 04/08/2016   Basal ganglia hemorrhage (HCC) 04/08/2016   Dysphagia as late effect of cerebrovascular disease    Migraine with aura and without status migrainosus, not intractable    Gait disturbance, post-stroke    Hemiplegia, post-stroke (HCC)    Aphasia, post-stroke    Dysarthria, post-stroke    ICH (intracerebral hemorrhage) (HCC) - R basal ganglia due to hypertensive emergency 04/01/2016   Upper airway cough syndrome 12/06/2015   PCP NOTES >>>>>>>>>>>>>>>>>>>>>>>>>>>.. 09/25/2015   Insomnia 12/28/2012   Annual physical exam 11/23/2011   Essential hypertension 10/19/2007   Home Medication(s) Prior to Admission medications   Medication Sig Start Date End Date Taking? Authorizing Provider  alendronate (FOSAMAX) 70 MG tablet Take  1 tablet (70 mg total) by mouth once a week with a full glass of water on an empty stomach. 05/06/23     amLODipine (NORVASC) 5 MG tablet Take 1 tablet (5 mg total) by mouth daily. 07/29/23   Wanda Plump, MD  atorvastatin (LIPITOR) 20 MG tablet Take 1 tablet (20 mg total) by mouth daily. 04/19/23   Wanda Plump, MD  baclofen (LIORESAL) 10 MG tablet Take by mouth. 10/25/17   [provider]  buPROPion (WELLBUTRIN XL) 150 MG 24 hr tablet Take 1 tablet (150 mg  total) by mouth once daily in the morning. 06/16/23     carvedilol (COREG) 25 MG tablet Take 1 tablet (25 mg total) by mouth 2 (two) times daily with meals. 03/21/23   Worthy Rancher B, FNP  Cholecalciferol (VITAMIN D3) 1.25 MG (50000 UT) CAPS Take 1 capsule by mouth once a week. 07/13/23   [provider]  Cholecalciferol 1.25 MG (50000 UT) capsule Take 1 capsule (50,000 Units total) by mouth once a week. 07/13/23     clopidogrel (PLAVIX) 75 MG tablet Take 1 tablet (75 mg total) by mouth daily. 03/15/23   Wanda Plump, MD  escitalopram (LEXAPRO) 20 MG tablet Take 1 tablet (20 mg total) by mouth daily. 09/10/23   Wanda Plump, MD  hydrochlorothiazide (HYDRODIURIL) 12.5 MG tablet Take 1 tablet (12.5 mg total) by mouth daily. 07/12/23   Wanda Plump, MD  losartan (COZAAR) 100 MG tablet Take 1 tablet (100 mg total) by mouth daily. 12/09/22   Wanda Plump, MD  losartan-hydrochlorothiazide (HYZAAR) 100-25 MG tablet Take 1 tablet by mouth daily.    [provider]  naltrexone (DEPADE) 50 MG tablet Take by mouth. 07/12/23   [provider]  prochlorperazine (COMPAZINE) 10 MG tablet Take by mouth. 02/28/23   [provider]  RYBELSUS 7 MG TABS Take 1 tablet by mouth daily. 03/12/23   [provider]  Semaglutide-Weight Management (WEGOVY) 0.25 MG/0.5ML SOAJ Inject into the skin. 03/23/23   [provider]  Semaglutide-Weight Management (WEGOVY) 1.7 MG/0.75ML SOAJ Inject 1.7 mg into the skin once a week. 04/20/23     Semaglutide-Weight Management 1 MG/0.5ML SOAJ Inject 1 mg into the skin once a week. 04/20/23     Semaglutide-Weight Management 2.4 MG/0.75ML SOAJ Inject 2.4 mg into the skin once a week. 04/20/23     tiZANidine (ZANAFLEX) 2 MG tablet TAKE 2 TABLETS (4 MG TOTAL) BY MOUTH 3 (THREE) TIMES DAILY. 02/23/23   Kirsteins, Victorino Sparrow, MD  traMADol (ULTRAM) 50 MG tablet Take 50 mg by mouth 3 (three) times daily as needed. 03/26/23   [provider]  traZODone (DESYREL)  100 MG tablet Take 1 tablet (100 mg total) by mouth at bedtime as needed for sleep. 03/15/23   Wanda Plump, MD  WEGOVY 0.25 MG/0.5ML SOAJ Inject into the skin. 03/23/23   [provider]  WEGOVY 0.5 MG/0.5ML SOAJ SMARTSIG:0.5 Milligram(s) SUB-Q Every 4 Weeks 04/21/23   [provider]  WELLBUTRIN XL 150 MG 24 hr tablet Take 150 mg by mouth daily.    [provider]  Past Surgical History Past Surgical History:  Procedure Laterality Date   ABDOMINAL HYSTERECTOMY  11/14/2007   no oophorectomy per surgical report   BREAST BIOPSY Right 02/08/2023   MM RT BREAST BX W LOC DEV 1ST LESION IMAGE BX SPEC STEREO GUIDE 02/08/2023 [facility]   CESAREAN SECTION     [id number]   HERNIA REPAIR     INGUINAL HERNIA REPAIR     2004   Family History Family History  Problem Relation Age of Onset   Diabetes Mother    Hypertension Mother    Glaucoma Mother    Colon cancer Mother        mother age 77   Cancer Mother    Anxiety disorder Mother    Obesity Mother    Rectal cancer Father        dx 36   Hypertension Father    Cancer Father    Sleep apnea Father    Alcoholism Father    Obesity Father    Stroke Paternal Grandfather    Stroke Maternal Grandfather    Diabetes Brother    Hypertension Brother    Heart disease Maternal Aunt        x 2 did 2012 CAD-CHF   Diabetes Brother    Hypertension Brother    Lung cancer Maternal Aunt    Breast cancer Neg Hx    Esophageal cancer Neg Hx    Stomach cancer Neg Hx     Social History Social History   Tobacco Use   Smoking status: Never   Smokeless tobacco: Never  Vaping Use   Vaping status: Never Used  Substance Use Topics   Alcohol use: No   Drug use: No   Allergies Bee venom, Iopamidol, Peanut-containing drug products, Aspirin, Latex, Zolpidem tartrate, and Hydrocodone  Review of  Systems Review of Systems  Constitutional:  Negative for chills and fever.  Respiratory:  Negative for shortness of breath.   Cardiovascular:  Negative for chest pain.  Gastrointestinal:  Positive for diarrhea.  Genitourinary:  Negative for dysuria.  Skin:  Negative for wound.  All other systems reviewed and are negative.   Physical Exam Vital Signs  I have reviewed the triage vital signs BP (!) 152/96   Pulse (!) 46   Temp 98 F (36.7 C)   Resp 16   Ht 4\' 10"  (1.473 m)   Wt 62.6 kg   SpO2 (!) 88%   BMI 28.84 kg/m  Physical Exam Vitals and nursing note reviewed.  Constitutional:      General: She is not in acute distress.    Appearance: Normal appearance. She is well-developed. She is not ill-appearing.  HENT:     Head: Normocephalic and atraumatic.     Right Ear: External ear normal.     Left Ear: External ear normal.     Nose: Nose normal.     Mouth/Throat:     Mouth: Mucous membranes are moist.  Eyes:     General: No scleral icterus.       Right eye: No discharge.        Left eye: No discharge.  Cardiovascular:     Rate and Rhythm: Normal rate.  Pulmonary:     Effort: Pulmonary effort is normal. No respiratory distress.     Breath sounds: No stridor.  Abdominal:     General: Abdomen is flat. There is no distension.     Tenderness: There is no guarding.  Musculoskeletal:  General: No deformity.     Cervical back: No rigidity.     Comments: LLE brace   Skin:    General: Skin is warm and dry.     Coloration: Skin is not cyanotic, jaundiced or pale.  Neurological:     Mental Status: She is alert and oriented to person, place, and time.     GCS: GCS eye subscore is 4. GCS verbal subscore is 5. GCS motor subscore is 6.  Psychiatric:        Speech: Speech normal.        Behavior: Behavior normal. Behavior is cooperative.     ED Results and Treatments Labs (all labs ordered are listed, but only abnormal results are displayed) Labs Reviewed  CBC  WITH DIFFERENTIAL/PLATELET - Abnormal; Notable for the following components:      Result Value   WBC 3.6 (*)    RBC 3.85 (*)    Hemoglobin 11.8 (*)    All other components within normal limits  BASIC METABOLIC PANEL - Abnormal; Notable for the following components:   Creatinine, Ser 1.02 (*)    All other components within normal limits                                                                                                                          Radiology No results found.  Pertinent labs & imaging results that were available during my care of the patient were reviewed by me and considered in my medical decision making (see MDM for details).  Medications Ordered in ED Medications  sodium chloride 0.9 % bolus 1,000 mL (0 mLs Intravenous Stopped 09/10/23 1458)                                                                                                                                     Procedures Procedures  (including critical care time)  Medical Decision Making / ED Course    Medical Decision Making:    ZELIE ASBILL is a 52 y.o. female with past medical history as below, significant for hypertension, HLD, migraine, left-sided hemiplegia with leg brace, CVA, OSA who presents to the ED with complaint of low blood pressure. The complaint involves an extensive differential diagnosis and also carries with it a high risk of complications and morbidity.  Serious etiology was considered.   Complete initial physical exam performed, notably the patient  was no acute distress, asymptomatic.    Reviewed and confirmed nursing documentation for past medical history, family history, social history.  Vital signs reviewed.    Clinical Course as of 09/10/23 1512  Fri Sep 10, 2023  1354 Hemoglobin(!): 11.8 Similar prior  [SG]  1505 SpO2(!): 88 % Pt asleep, hx OSA; no hypoxia when awake [SG]    Clinical Course User Index [SG] Sloan Leiter, DO     Patient was told her  blood pressure was low at clinic.  On recheck your blood pressure is normal.  She is asymptomatic.  Will get screening labs and give fluids, check EKG given report of recent hypotension other concern this could be potential erroneous reading  Patient does report difficulty sleeping over the past month or 2, discussed sleep hygiene with the patient, consider adding melatonin.  Follow-up PCP   She is feeling well on recheck, at her baseline currently.  Hemodynamically stable.  Recommend she follow with PCP for further evaluation.  Recommend she monitor blood pressure at home  The patient improved significantly and was discharged in stable condition. Detailed discussions were had with the patient regarding current findings, and need for close f/u with PCP or on call doctor. The patient has been instructed to return immediately if the symptoms worsen in any way for re-evaluation. Patient verbalized understanding and is in agreement with current care plan. All questions answered prior to discharge.                Additional history obtained: -Additional history obtained from na -External records from outside source obtained and reviewed including: Chart review including previous notes, labs, imaging, consultation notes including  Home medications, prior labs   Lab Tests: -I ordered, reviewed, and interpreted labs.   The pertinent results include:   Labs Reviewed  CBC WITH DIFFERENTIAL/PLATELET - Abnormal; Notable for the following components:      Result Value   WBC 3.6 (*)    RBC 3.85 (*)    Hemoglobin 11.8 (*)    All other components within normal limits  BASIC METABOLIC PANEL - Abnormal; Notable for the following components:   Creatinine, Ser 1.02 (*)    All other components within normal limits    Notable for labs stable  EKG   EKG Interpretation Date/Time:  Friday September 10 2023 12:31:08 EDT Ventricular Rate:  70 PR Interval:  154 QRS Duration:  78 QT  Interval:  399 QTC Calculation: 431 R Axis:   -20  Text Interpretation: Sinus rhythm Borderline left axis deviation Low voltage, precordial leads Confirmed by Tanda Rockers ([id number]) on 09/10/2023 2:02:13 PM         Imaging Studies ordered: na   Medicines ordered and prescription drug management: Meds ordered this encounter  Medications   sodium chloride 0.9 % bolus 1,000 mL    -I have reviewed the patients home medicines and have made adjustments as needed   Consultations Obtained: na   Cardiac Monitoring: Continuous pulse oximetry interpreted by myself, 98% on RA.    Social Determinants of Health:  Diagnosis or treatment significantly limited by social determinants of health: na   Reevaluation: After the interventions noted above, I reevaluated the patient and found that they have resolved  Co morbidities that complicate the patient evaluation  Past Medical History:  Diagnosis Date   Allergy    Anemia    Anxiety and depression    Arthritis    Back pain    Blood transfusion without reported  diagnosis    Essential hypertension 10/19/2007   03-2010: metoprolol changed to bystolic (was not feeling well on it, no specific allergy or reaction)     Food allergy    Hemiplegia (HCC)    Hyperlipemia    Hypertension    Joint pain    Migraine    Neuromuscular disorder (HCC)    left sided hemiplegia - has brace for l leg and walks with a cane   Obesity    Ovarian cancer (HCC)    Prediabetes    Sleep apnea    not wearing c-pap yet   Sleep apnea    Stroke [facility]) 2017   Vitamin D deficiency       Dispostion: Disposition decision including need for hospitalization was considered, and patient discharged from emergency department.    Final Clinical Impression(s) / ED Diagnoses Final diagnoses:  Difficulty sleeping        Sloan Leiter, DO 09/10/23 1512    Tanda Rockers A, DO 09/10/23 1513

## 2023-09-10 NOTE — ED Triage Notes (Signed)
The patient was seen at her dr today and her blood pressure was low 86/53. She stated she has been feeling fatigue.

## 2023-09-13 ENCOUNTER — Encounter: Payer: Self-pay | Admitting: Internal Medicine

## 2023-09-14 ENCOUNTER — Other Ambulatory Visit (HOSPITAL_BASED_OUTPATIENT_CLINIC_OR_DEPARTMENT_OTHER): Payer: Self-pay

## 2023-09-14 ENCOUNTER — Ambulatory Visit (INDEPENDENT_AMBULATORY_CARE_PROVIDER_SITE_OTHER): Payer: BC Managed Care – PPO | Admitting: Family Medicine

## 2023-09-14 ENCOUNTER — Encounter: Payer: Self-pay | Admitting: Family Medicine

## 2023-09-14 VITALS — BP 108/80 | HR 65 | Temp 98.0°F | Resp 16 | Ht <= 58 in

## 2023-09-14 DIAGNOSIS — L089 Local infection of the skin and subcutaneous tissue, unspecified: Secondary | ICD-10-CM

## 2023-09-14 DIAGNOSIS — T148XXA Other injury of unspecified body region, initial encounter: Secondary | ICD-10-CM | POA: Diagnosis not present

## 2023-09-14 DIAGNOSIS — Z23 Encounter for immunization: Secondary | ICD-10-CM

## 2023-09-14 MED ORDER — FLUCONAZOLE 150 MG PO TABS
150.0000 mg | ORAL_TABLET | Freq: Every day | ORAL | 0 refills | Status: DC
Start: 2023-09-14 — End: 2023-10-06
  Filled 2023-09-14: qty 2, 4d supply, fill #0

## 2023-09-14 MED ORDER — DOXYCYCLINE HYCLATE 100 MG PO TABS
100.0000 mg | ORAL_TABLET | Freq: Two times a day (BID) | ORAL | 0 refills | Status: DC
Start: 2023-09-14 — End: 2023-10-06
  Filled 2023-09-14: qty 20, 10d supply, fill #0

## 2023-09-14 NOTE — Progress Notes (Signed)
Established Patient Office Visit  Subjective   Patient ID: Abigail Campos, female    DOB: 03/08/1971  Age: 52 y.o. MRN: [id number]  Chief Complaint  Patient presents with  . Leg Swelling    Pt states having left shin swelling. Pt states having an open wound for leg brace    HPI Discussed the use of AI scribe software for clinical note transcription with the patient, who gave verbal consent to proceed.  History of Present Illness   The patient presents with a two-week history of a purulent leg wound, which she attributes to irritation from a leg brace. The brace was prescribed following a stroke, which has resulted in weakness in the affected leg. The patient is currently undergoing physical therapy for this condition. She also reports a recent fall at home, which resulted in a broken tibia. The patient is scheduled to see a prosthetist to replace the current brace, which she believes is causing the wound. She also reports recurrent yeast infections, which she attributes to menopause.      Patient Active Problem List   Diagnosis Date Noted  . Depression, major, single episode, severe (HCC) 07/23/2022  . Vitamin D deficiency 07/06/2021  . History of CVA (cerebrovascular accident) 07/06/2021  . Other constipation 03/18/2021  . Hot flashes 03/17/2021  . Prediabetes 02/11/2021  . Neuropathic pain 09/19/2020  . Adjustment disorder with mixed anxiety and depressed mood 09/19/2020  . Abnormality of gait 05/09/2020  . Hemiplegia of nondominant side, late effect of cerebrovascular disease (HCC) 03/28/2020  . Spastic hemiplegia of left nondominant side due to nontraumatic intraparenchymal hemorrhage of brain (HCC) 12/26/2019  . Trigger thumb of right hand 11/02/2019  . OSA (obstructive sleep apnea) 07/26/2019  . Spastic hemiplegia of left nondominant side as late effect of nontraumatic intraparenchymal hemorrhage of brain (HCC) 04/06/2019  . Spastic hemiparesis affecting dominant side  (HCC) 01/04/2018  . Hyperglycemia 12/29/2016  . Muscle spasticity   . Hyperlipidemia 04/08/2016  . Basal ganglia hemorrhage (HCC) 04/08/2016  . Dysphagia as late effect of cerebrovascular disease   . Migraine with aura and without status migrainosus, not intractable   . Gait disturbance, post-stroke   . Hemiplegia, post-stroke (HCC)   . Aphasia, post-stroke   . Dysarthria, post-stroke   . ICH (intracerebral hemorrhage) (HCC) - R basal ganglia due to hypertensive emergency 04/01/2016  . Upper airway cough syndrome 12/06/2015  . PCP NOTES >>>>>>>>>>>>>>>>>>>>>>>>>>>.. 09/25/2015  . Insomnia 12/28/2012  . Annual physical exam 11/23/2011  . Essential hypertension 10/19/2007   Past Medical History:  Diagnosis Date  . Allergy   . Anemia   . Anxiety and depression   . Arthritis   . Back pain   . Blood transfusion without reported diagnosis   . Essential hypertension 10/19/2007   03-2010: metoprolol changed to bystolic (was not feeling well on it, no specific allergy or reaction)    . Food allergy   . Hemiplegia (HCC)   . Hyperlipemia   . Hypertension   . Joint pain   . Migraine   . Neuromuscular disorder (HCC)    left sided hemiplegia - has brace for l leg and walks with a cane  . Obesity   . Ovarian cancer (HCC)   . Prediabetes   . Sleep apnea    not wearing c-pap yet  . Sleep apnea   . Stroke (HCC) 2017  . Vitamin D deficiency    Past Surgical History:  Procedure Laterality Date  . ABDOMINAL HYSTERECTOMY  11/14/2007   no oophorectomy per surgical report  . BREAST BIOPSY Right 02/08/2023   MM RT BREAST BX W LOC DEV 1ST LESION IMAGE BX SPEC STEREO GUIDE 02/08/2023 [facility]  . CESAREAN SECTION     [id number]  . HERNIA REPAIR    . INGUINAL HERNIA REPAIR     2004   Social History   Tobacco Use  . Smoking status: Never  . Smokeless tobacco: Never  Vaping Use  . Vaping status: Never Used  Substance Use Topics  . Alcohol use: No  . Drug use: No   Social  History   Socioeconomic History  . Marital status: Married    Spouse name: Vonna Kotyk  . Number of children: 2  . Years of education: Not on file  . Highest education level: Associate degree: occupational, Scientist, product/process development, or vocational program  Occupational History  . Occupation: disable d/t stroke TEACHER    Employer: [employer]  Tobacco Use  . Smoking status: Never  . Smokeless tobacco: Never  Vaping Use  . Vaping status: Never Used  Substance and Sexual Activity  . Alcohol use: No  . Drug use: No  . Sexual activity: Not Currently    Birth control/protection: Surgical  Other Topics Concern  . Not on file  Social History Narrative   Lives with husband and son (2007)   Left Hand prior to stroke   Drinks 1-2 cups daily   Daughter (1998), married    Son 2008   Social Determinants of Health   Financial Resource Strain: Medium Risk (08/09/2023)   Received [facility]   Overall Financial Resource Strain (CARDIA)   . Difficulty of Paying Living Expenses: Somewhat hard  Food Insecurity: Food Insecurity Present (08/09/2023)   Received from [facility]   Hunger Vital Sign   . Worried About Programme researcher, broadcasting/film/video in the Last Year: Sometimes true   . Ran Out of Food in the Last Year: Sometimes true  Transportation Needs: No Transportation Needs (08/09/2023)   Received from [facility] - Transportation   . Lack of Transportation (Medical): No   . Lack of Transportation (Non-Medical): No  Physical Activity: Sufficiently Active (08/09/2023)   Received from [facility]   Exercise Vital Sign   . Days of Exercise per Week: 5 days   . Minutes of Exercise per Session: 30 min  Stress: Stress Concern Present (08/09/2023)   Received from [facility] - Occupational Stress Questionnaire   . Feeling of Stress : To some extent  Social Connections: Socially Isolated (08/09/2023)   Received from [facility]   Social Network   . How  would you rate your social network (family, work, friends)?: Little participation, lonely and socially isolated  Intimate Partner Violence: Not At Risk (08/09/2023)   Received from [facility]   HITS   . Over the last 12 months how often did your partner physically hurt you?: 1   . Over the last 12 months how often did your partner insult you or talk down to you?: 1   . Over the last 12 months how often did your partner threaten you with physical harm?: 1   . Over the last 12 months how often did your partner scream or curse at you?: 2   Family Status  Relation Name Status  . Mother  Alive  . Father  Alive  . PGF  Deceased  . MGF  (Not Specified)  .  Brother  (Not Specified)  . Mat Aunt  (Not Specified)  . Brother  (Not Specified)  . Mat Aunt  (Not Specified)  . Neg Hx  (Not Specified)  No partnership data on file   Family History  Problem Relation Age of Onset  . Diabetes Mother   . Hypertension Mother   . Glaucoma Mother   . Colon cancer Mother        mother age 36  . Cancer Mother   . Anxiety disorder Mother   . Obesity Mother   . Rectal cancer Father        dx 12  . Hypertension Father   . Cancer Father   . Sleep apnea Father   . Alcoholism Father   . Obesity Father   . Stroke Paternal Grandfather   . Stroke Maternal Grandfather   . Diabetes Brother   . Hypertension Brother   . Heart disease Maternal Aunt        x 2 did 2012 CAD-CHF  . Diabetes Brother   . Hypertension Brother   . Lung cancer Maternal Aunt   . Breast cancer Neg Hx   . Esophageal cancer Neg Hx   . Stomach cancer Neg Hx       Review of Systems  Constitutional:  Negative for chills, fever and malaise/fatigue.  HENT:  Negative for congestion and hearing loss.   Eyes:  Negative for blurred vision and discharge.  Respiratory:  Negative for cough, sputum production and shortness of breath.   Cardiovascular:  Negative for chest pain, palpitations and leg swelling.  Gastrointestinal:  Negative  for abdominal pain, blood in stool, constipation, diarrhea, heartburn, nausea and vomiting.  Genitourinary:  Negative for dysuria, frequency, hematuria and urgency.  Musculoskeletal:  Negative for back pain, falls and myalgias.  Skin:  Negative for rash.  Neurological:  Negative for dizziness, sensory change, loss of consciousness, weakness and headaches.  Endo/Heme/Allergies:  Negative for environmental allergies. Does not bruise/bleed easily.  Psychiatric/Behavioral:  Negative for depression and suicidal ideas. The patient is not nervous/anxious and does not have insomnia.      Objective:     BP 108/80 (BP Location: Right Arm, Patient Position: Sitting, Cuff Size: Normal)   Pulse 65   Temp 98 F (36.7 C) (Oral)   Resp 16   Ht 4\' 10"  (1.473 m)   SpO2 100%   BMI 28.84 kg/m  BP Readings from Last 3 Encounters:  09/14/23 108/80  09/10/23 (!) 147/89  09/10/23 95/66   Wt Readings from Last 3 Encounters:  09/10/23 138 lb (62.6 kg)  09/10/23 137 lb (62.1 kg)  08/20/23 137 lb (62.1 kg)   SpO2 Readings from Last 3 Encounters:  09/14/23 100%  09/10/23 100%  09/10/23 98%      Physical Exam Vitals and nursing note reviewed.  Constitutional:      General: She is not in acute distress.    Appearance: Normal appearance. She is well-developed.  HENT:     Head: Normocephalic and atraumatic.  Eyes:     General: No scleral icterus.       Right eye: No discharge.        Left eye: No discharge.  Cardiovascular:     Rate and Rhythm: Normal rate and regular rhythm.     Heart sounds: No murmur heard. Pulmonary:     Effort: Pulmonary effort is normal. No respiratory distress.     Breath sounds: Normal breath sounds.  Musculoskeletal:  General: Normal range of motion.     Cervical back: Normal range of motion and neck supple.     Right lower leg: No edema.     Left lower leg: No edema.  Skin:    General: Skin is warm and dry.     Findings: Erythema and lesion present. Rash:  ulceration L low leg--- about 2 cm diam.      Neurological:     Mental Status: She is alert and oriented to person, place, and time.  Psychiatric:        Mood and Affect: Mood normal.        Behavior: Behavior normal.        Thought Content: Thought content normal.        Judgment: Judgment normal.    No results found for any visits on 09/14/23.  Last CBC Lab Results  Component Value Date   WBC 3.6 (L) 09/10/2023   HGB 11.8 (L) 09/10/2023   HCT 36.6 09/10/2023   MCV 95.1 09/10/2023   MCH 30.6 09/10/2023   RDW 13.4 09/10/2023   PLT 153 09/10/2023   Last metabolic panel Lab Results  Component Value Date   GLUCOSE 85 09/10/2023   NA 140 09/10/2023   K 4.0 09/10/2023   CL 103 09/10/2023   CO2 30 09/10/2023   BUN 7 09/10/2023   CREATININE 1.02 (H) 09/10/2023   GFRNONAA >60 09/10/2023   CALCIUM 9.2 09/10/2023   PHOS 3.0 04/06/2016   PROT 7.4 02/28/2023   ALBUMIN 3.6 02/28/2023   LABGLOB 3.1 12/26/2020   AGRATIO 1.5 12/26/2020   BILITOT 0.7 02/28/2023   ALKPHOS 62 02/28/2023   AST 26 02/28/2023   ALT 30 02/28/2023   ANIONGAP 7 09/10/2023   Last lipids Lab Results  Component Value Date   CHOL 118 01/05/2023   HDL 48.10 01/05/2023   LDLCALC 56 01/05/2023   TRIG 70.0 01/05/2023   CHOLHDL 2 01/05/2023   Last hemoglobin A1c Lab Results  Component Value Date   HGBA1C 5.5 01/05/2023   Last thyroid functions Lab Results  Component Value Date   TSH 0.737 06/11/2021   Last vitamin D Lab Results  Component Value Date   VD25OH 30.82 11/24/2021   Last vitamin B12 and Folate Lab Results  Component Value Date   VITAMINB12 364 06/11/2021   FOLATE 17.5 11/09/2007      The ASCVD Risk score (Arnett DK, et al., 2019) failed to calculate for the following reasons:   The patient has a prior MI or stroke diagnosis    Assessment & Plan:   Problem List Items Addressed This Visit   None Visit Diagnoses     Wound infection    -  Primary   Relevant Medications    doxycycline (VIBRA-TABS) 100 MG tablet   fluconazole (DIFLUCAN) 150 MG tablet   Need for influenza vaccination       Relevant Orders   Flu vaccine trivalent PF, 6mos and older(Flulaval,Afluria,Fluarix,Fluzone) (Completed)     Assessment and Plan    Skin Infection Noted purulent drainage and pain on palpation of the skin under the brace. Likely secondary to irritation from the brace. -Prescribe antibiotics and Diflucan to prevent yeast infection. -Advise patient to contact [facility] for brace replacement or modification. -Apply gauze or padding to prevent further irritation. -If no improvement, patient to notify the clinic.  Stroke with residual weakness Patient is in physical therapy for foot weakness post-stroke. -Continue current physical therapy regimen.  General Health Maintenance -Administer  influenza vaccine today. -Plan for Shingles vaccine after patient turns 52 in November.        No follow-ups on file.    Donato Schultz, DO

## 2023-09-21 ENCOUNTER — Other Ambulatory Visit: Payer: Self-pay | Admitting: Physical Medicine & Rehabilitation

## 2023-09-29 ENCOUNTER — Encounter (HOSPITAL_BASED_OUTPATIENT_CLINIC_OR_DEPARTMENT_OTHER): Payer: Self-pay

## 2023-09-29 ENCOUNTER — Other Ambulatory Visit: Payer: Self-pay | Admitting: Family Medicine

## 2023-09-29 ENCOUNTER — Other Ambulatory Visit: Payer: Self-pay | Admitting: Internal Medicine

## 2023-09-29 ENCOUNTER — Other Ambulatory Visit (HOSPITAL_BASED_OUTPATIENT_CLINIC_OR_DEPARTMENT_OTHER): Payer: Self-pay

## 2023-09-29 DIAGNOSIS — L089 Local infection of the skin and subcutaneous tissue, unspecified: Secondary | ICD-10-CM

## 2023-09-29 MED ORDER — BUPROPION HCL ER (XL) 150 MG PO TB24
150.0000 mg | ORAL_TABLET | Freq: Every day | ORAL | 1 refills | Status: DC
Start: 2023-09-29 — End: 2023-10-06
  Filled 2023-09-29: qty 90, 90d supply, fill #0

## 2023-09-29 MED FILL — Carvedilol Tab 25 MG: ORAL | 90 days supply | Qty: 180 | Fill #0 | Status: AC

## 2023-09-29 MED FILL — Clopidogrel Bisulfate Tab 75 MG (Base Equiv): ORAL | 90 days supply | Qty: 90 | Fill #0 | Status: AC

## 2023-09-29 MED FILL — Atorvastatin Calcium Tab 20 MG (Base Equivalent): ORAL | 90 days supply | Qty: 90 | Fill #0 | Status: AC

## 2023-09-30 DIAGNOSIS — L97921 Non-pressure chronic ulcer of unspecified part of left lower leg limited to breakdown of skin: Secondary | ICD-10-CM | POA: Diagnosis not present

## 2023-09-30 DIAGNOSIS — R632 Polyphagia: Secondary | ICD-10-CM | POA: Diagnosis not present

## 2023-09-30 DIAGNOSIS — I69398 Other sequelae of cerebral infarction: Secondary | ICD-10-CM | POA: Diagnosis not present

## 2023-09-30 DIAGNOSIS — R053 Chronic cough: Secondary | ICD-10-CM | POA: Diagnosis not present

## 2023-09-30 DIAGNOSIS — Z9181 History of falling: Secondary | ICD-10-CM | POA: Diagnosis not present

## 2023-09-30 DIAGNOSIS — Z6829 Body mass index (BMI) 29.0-29.9, adult: Secondary | ICD-10-CM | POA: Diagnosis not present

## 2023-09-30 DIAGNOSIS — Z8639 Personal history of other endocrine, nutritional and metabolic disease: Secondary | ICD-10-CM | POA: Diagnosis not present

## 2023-09-30 DIAGNOSIS — E663 Overweight: Secondary | ICD-10-CM | POA: Diagnosis not present

## 2023-10-04 ENCOUNTER — Telehealth: Payer: Self-pay | Admitting: Specialist

## 2023-10-04 NOTE — Telephone Encounter (Signed)
Noah from [employer] called.  Patient is a mutual patient of Dr. Wynn Banker and [facility].  Patient has a leg brace that is not fitting correctly, that we prescribed. Patient now has an ulcer on her leg from the brace.  Please call Anette Riedel at [phone] to discuss and determine if an appt is needed.  Shirlean Mylar, MHA, OT/L [phone]

## 2023-10-05 ENCOUNTER — Other Ambulatory Visit: Payer: Self-pay

## 2023-10-05 NOTE — Telephone Encounter (Signed)
Left message for Anette Riedel that we are reaching out to the patient to make an appointment for Dr Wynn Banker to see her.

## 2023-10-06 ENCOUNTER — Ambulatory Visit (INDEPENDENT_AMBULATORY_CARE_PROVIDER_SITE_OTHER): Payer: BC Managed Care – PPO | Admitting: Internal Medicine

## 2023-10-06 ENCOUNTER — Other Ambulatory Visit (HOSPITAL_BASED_OUTPATIENT_CLINIC_OR_DEPARTMENT_OTHER): Payer: Self-pay

## 2023-10-06 ENCOUNTER — Encounter: Payer: Self-pay | Admitting: Internal Medicine

## 2023-10-06 VITALS — BP 118/86 | HR 61 | Temp 98.1°F | Resp 18 | Ht <= 58 in | Wt 143.0 lb

## 2023-10-06 DIAGNOSIS — I69359 Hemiplegia and hemiparesis following cerebral infarction affecting unspecified side: Secondary | ICD-10-CM

## 2023-10-06 DIAGNOSIS — F32A Depression, unspecified: Secondary | ICD-10-CM | POA: Diagnosis not present

## 2023-10-06 DIAGNOSIS — T148XXA Other injury of unspecified body region, initial encounter: Secondary | ICD-10-CM | POA: Diagnosis not present

## 2023-10-06 DIAGNOSIS — F419 Anxiety disorder, unspecified: Secondary | ICD-10-CM | POA: Diagnosis not present

## 2023-10-06 DIAGNOSIS — Z8249 Family history of ischemic heart disease and other diseases of the circulatory system: Secondary | ICD-10-CM

## 2023-10-06 DIAGNOSIS — G4700 Insomnia, unspecified: Secondary | ICD-10-CM

## 2023-10-06 DIAGNOSIS — Z8673 Personal history of transient ischemic attack (TIA), and cerebral infarction without residual deficits: Secondary | ICD-10-CM

## 2023-10-06 DIAGNOSIS — L089 Local infection of the skin and subcutaneous tissue, unspecified: Secondary | ICD-10-CM | POA: Diagnosis not present

## 2023-10-06 MED ORDER — BUPROPION HCL ER (XL) 150 MG PO TB24
150.0000 mg | ORAL_TABLET | Freq: Every day | ORAL | 1 refills | Status: DC
Start: 2023-10-06 — End: 2024-07-27
  Filled 2023-10-06: qty 90, 90d supply, fill #0
  Filled 2023-12-30: qty 90, 90d supply, fill #1
  Filled 2024-04-25: qty 30, 30d supply, fill #1
  Filled 2024-06-20: qty 30, 30d supply, fill #2
  Filled 2024-07-23: qty 30, 30d supply, fill #3

## 2023-10-06 MED ORDER — FLUCONAZOLE 150 MG PO TABS
150.0000 mg | ORAL_TABLET | Freq: Every day | ORAL | 0 refills | Status: DC
  Filled 2023-10-06: qty 2, 3d supply, fill #0

## 2023-10-06 MED ORDER — COVID-19 MRNA VAC-TRIS(PFIZER) 30 MCG/0.3ML IM SUSY
0.3000 mL | PREFILLED_SYRINGE | Freq: Once | INTRAMUSCULAR | 0 refills | Status: AC
  Filled 2023-10-06: qty 0.3, 1d supply, fill #0

## 2023-10-06 NOTE — Progress Notes (Signed)
Subjective:    Patient ID: Abigail Campos, female    DOB: 1971-04-02, 52 y.o.   MRN: [id number]  DOS:  10/06/2023 Type of visit - description: Follow-up  Multiple issues discussed. HTN: Ambulatory BPs are currently well-controlled. Still concerned about her family history of CAD.  Denies chest pain. Had a wound at the left pretibial area, better, had antibiotics, still has a yeast infection: Vaginal itching and white discharge.   Review of Systems See above   Past Medical History:  Diagnosis Date   Allergy    Anemia    Anxiety and depression    Arthritis    Back pain    Blood transfusion without reported diagnosis    Essential hypertension 10/19/2007   03-2010: metoprolol changed to bystolic (was not feeling well on it, no specific allergy or reaction)     Food allergy    Hemiplegia (HCC)    Hyperlipemia    Hypertension    Joint pain    Migraine    Neuromuscular disorder (HCC)    left sided hemiplegia - has brace for l leg and walks with a cane   Obesity    Ovarian cancer (HCC)    Prediabetes    Sleep apnea    not wearing c-pap yet   Sleep apnea    Stroke (HCC) 2017   Vitamin D deficiency     Past Surgical History:  Procedure Laterality Date   ABDOMINAL HYSTERECTOMY  11/14/2007   no oophorectomy per surgical report   BREAST BIOPSY Right 02/08/2023   MM RT BREAST BX W LOC DEV 1ST LESION IMAGE BX SPEC STEREO GUIDE 02/08/2023 [facility]MO[facility]   CESAREAN SECTION     [id number]   HERNIA REPAIR     INGUINAL HERNIA REPAIR     2004    Current Outpatient Medications  Medication Instructions   alendronate (FOSAMAX) 70 MG tablet Take 1 tablet (70 mg total) by mouth once a week with a full glass of water on an empty stomach.   amLODipine (NORVASC) 5 mg, Oral, Daily   atorvastatin (LIPITOR) 20 mg, Oral, Daily   baclofen (LIORESAL) 10 MG tablet Oral   buPROPion (WELLBUTRIN XL) 150 mg, Oral, Daily   carvedilol (COREG) 25 MG tablet Take 1 tablet (25 mg total) by  mouth 2 (two) times daily with meals.   Cholecalciferol (VITAMIN D3) 1.25 MG (50000 UT) CAPS 1 capsule, Oral, Weekly   Cholecalciferol 50,000 Units, Oral, Weekly   clopidogrel (PLAVIX) 75 mg, Oral, Daily   COVID-19 mRNA vaccine, Pfizer, (COMIRNATY) syringe 0.3 mLs, Intramuscular,  Once   escitalopram (LEXAPRO) 20 mg, Oral, Daily   fluconazole (DIFLUCAN) 150 mg, Oral, Daily, MAY REPEAT IN 3 DAYS AS NEEDED   losartan (COZAAR) 100 mg, Oral, Daily   naltrexone (DEPADE) 50 MG tablet Oral   prochlorperazine (COMPAZINE) 10 MG tablet Oral   Semaglutide-Weight Management 2.4 mg, Subcutaneous, Weekly   tiZANidine (ZANAFLEX) 4 mg, Oral, 3 times daily   traMADol (ULTRAM) 50 mg, 3 times daily PRN   traZODone (DESYREL) 100 mg, Oral, At bedtime PRN       Objective:   Physical Exam BP 118/86   Pulse 61   Temp 98.1 F (36.7 C) (Oral)   Resp 18   Ht 4\' 10"  (1.473 m)   Wt 143 lb (64.9 kg)   SpO2 98%   BMI 29.89 kg/m  General:   Well developed, NAD, BMI noted. HEENT:  Normocephalic . Face symmetric, atraumatic Lungs:  CTA B  Normal respiratory effort, no intercostal retractions, no accessory muscle use. Heart: RRR,  no murmur.  Lower extremities: no pretibial edema bilaterally  Skin: Unable to see the pretibial skin. Neurologic:  alert & oriented X3.  Speech normal, gait at baseline, has a brace on the left leg. Psych--  Cognition and judgment appear intact.  Cooperative with normal attention span and concentration.  Behavior appropriate. No anxious or depressed appearing.      Assessment     Assessment   Prediabetes: A1c 6.0 (April 2017)  HTN-- HCTZ increased creatinine (07-2023) Hyperlipidemia Depression, insomnia, anxiety GERD Stroke 03-2016: Sequela (L)  spastic hemiplegia   ICH, right basilar ganglia due to HTN emergency, + encephalopathy, + dysphagia, aphasia, dysarthria Residual L spasticity.  CTA neck 03-2016 neg CTA head 10-17 (-) aneurysm Headaches , migraines dx after  2nd pregnancy, (-) CT head 2014 and 2015 OSA: Sleep study 08-2019, saw Dr. Maple Hudson, intolerant to CPAP  Osteoporosis.  T-score 04/2023 -3.2.  Rx Fosamax Vitamin D deficiency  PLAN HTN: See LOV, HCTZ added for better control, subsequently creatinine was increased so it was stopped.  Amlodipine added.  She also takes carvedilol and losartan.  BPs are very good.  No change. Depression, anxiety, insomnia: Refill Wellbutrin, continue Lexapro, still has some issues with sleeping on trazodone.  No change for now. Sees Dr. Earlene Plater for weight management: Has not been able to get semaglutide.  Was advised to stop Rybelsus - per pt. FH CAD: See previous entry, patient continued to be concerned about family history of CAD, multiple cousins, aunts.   EKG 09/10/2023 NSR.  Cholesterol well-controlled, history of stroke. Plan: Stress test. Wound, L shin: Resolved but it was related to ASO, it needs to be adjusted by [facility]. Vaginal infection: After taking antibiotics, Rx 2nd round of Diflucan, also recommend OTC Monistat. Flu shot, plans to get a COVID-vaccine. RTC 4 months

## 2023-10-06 NOTE — Assessment & Plan Note (Signed)
HTN: See LOV, HCTZ added for better control, subsequently creatinine was increased so it was stopped.  Amlodipine added.  She also takes carvedilol and losartan.  BPs are very good.  No change. Depression, anxiety, insomnia: Refill Wellbutrin, continue Lexapro, still has some issues with sleeping on trazodone.  No change for now. Sees Dr. Earlene Plater for weight management: Has not been able to get semaglutide.  Was advised to stop Rybelsus - per pt. FH CAD: See previous entry, patient continued to be concerned about family history of CAD, multiple cousins, aunts.   EKG 09/10/2023 NSR.  Cholesterol well-controlled, history of stroke. Plan: Stress test. Wound, L shin: Resolved but it was related to ASO, it needs to be adjusted by [facility]. Vaginal infection: After taking antibiotics, Rx 2nd round of Diflucan, also recommend OTC Monistat. Flu shot, plans to get a COVID-vaccine. RTC 4 months

## 2023-10-06 NOTE — Patient Instructions (Addendum)
Diflucan 150 mg : 1 tablet twice a day. Also get over-the-counter Monistat cream: Use it for several days.  Will arrange stress test to check your heart.   Vaccines I recommend: Covid booster   Check the  blood pressure regularly Blood pressure goal:  between 110/65 and  135/85. If it is consistently higher or lower, let me know      Next visit with me in 4 to 5 months Please schedule it at the front desk

## 2023-10-09 ENCOUNTER — Other Ambulatory Visit: Payer: Self-pay | Admitting: Internal Medicine

## 2023-10-09 DIAGNOSIS — I10 Essential (primary) hypertension: Secondary | ICD-10-CM

## 2023-10-10 ENCOUNTER — Other Ambulatory Visit: Payer: Self-pay | Admitting: Internal Medicine

## 2023-10-11 ENCOUNTER — Other Ambulatory Visit (HOSPITAL_BASED_OUTPATIENT_CLINIC_OR_DEPARTMENT_OTHER): Payer: Self-pay

## 2023-10-11 ENCOUNTER — Other Ambulatory Visit: Payer: Self-pay

## 2023-10-11 ENCOUNTER — Ambulatory Visit (HOSPITAL_COMMUNITY): Payer: BC Managed Care – PPO | Attending: Internal Medicine

## 2023-10-11 DIAGNOSIS — Z8249 Family history of ischemic heart disease and other diseases of the circulatory system: Secondary | ICD-10-CM | POA: Insufficient documentation

## 2023-10-11 DIAGNOSIS — I10 Essential (primary) hypertension: Secondary | ICD-10-CM | POA: Diagnosis not present

## 2023-10-11 DIAGNOSIS — G4730 Sleep apnea, unspecified: Secondary | ICD-10-CM | POA: Insufficient documentation

## 2023-10-11 DIAGNOSIS — E785 Hyperlipidemia, unspecified: Secondary | ICD-10-CM | POA: Insufficient documentation

## 2023-10-11 DIAGNOSIS — Z8673 Personal history of transient ischemic attack (TIA), and cerebral infarction without residual deficits: Secondary | ICD-10-CM | POA: Insufficient documentation

## 2023-10-11 LAB — MYOCARDIAL PERFUSION IMAGING
LV dias vol: 58 mL (ref 46–106)
LV sys vol: 21 mL
Nuc Stress EF: 65 %
Peak HR: 96 {beats}/min
Rest HR: 63 {beats}/min
Rest Nuclear Isotope Dose: 8.3 mCi
SDS: 2
SRS: 0
SSS: 2
ST Depression (mm): 0 mm
Stress Nuclear Isotope Dose: 26.3 mCi
TID: 0.97

## 2023-10-11 MED ORDER — TECHNETIUM TC 99M TETROFOSMIN IV KIT
26.3000 | PACK | Freq: Once | INTRAVENOUS | Status: AC | PRN
  Administered 2023-10-11: 26.3 via INTRAVENOUS

## 2023-10-11 MED ORDER — REGADENOSON 0.4 MG/5ML IV SOLN
0.4000 mg | Freq: Once | INTRAVENOUS | Status: AC
  Administered 2023-10-11: 0.4 mg via INTRAVENOUS

## 2023-10-11 MED ORDER — AMLODIPINE BESYLATE 5 MG PO TABS
5.0000 mg | ORAL_TABLET | Freq: Every day | ORAL | 1 refills | Status: DC
  Filled 2023-10-11: qty 90, 90d supply, fill #0
  Filled 2024-01-03: qty 90, 90d supply, fill #1

## 2023-10-11 MED ORDER — BACLOFEN 10 MG PO TABS
10.0000 mg | ORAL_TABLET | Freq: Every day | ORAL | 11 refills | Status: DC
  Filled 2023-10-11: qty 30, 30d supply, fill #0
  Filled 2023-11-16: qty 30, 30d supply, fill #1
  Filled 2024-01-03: qty 30, 30d supply, fill #2
  Filled 2024-01-29: qty 30, 30d supply, fill #3
  Filled 2024-02-16: qty 30, 30d supply, fill #4
  Filled 2024-03-20: qty 30, 30d supply, fill #5
  Filled 2024-05-10: qty 30, 30d supply, fill #6
  Filled 2024-06-20: qty 30, 30d supply, fill #7
  Filled 2024-07-23: qty 30, 30d supply, fill #8
  Filled 2024-08-16: qty 30, 30d supply, fill #9
  Filled 2024-09-24: qty 30, 30d supply, fill #10

## 2023-10-11 MED ORDER — TECHNETIUM TC 99M TETROFOSMIN IV KIT
8.3000 | PACK | Freq: Once | INTRAVENOUS | Status: AC | PRN
  Administered 2023-10-11: 8.3 via INTRAVENOUS

## 2023-10-12 ENCOUNTER — Encounter: Payer: Self-pay | Admitting: Physical Medicine & Rehabilitation

## 2023-10-12 ENCOUNTER — Encounter
Payer: BC Managed Care – PPO | Attending: Physical Medicine & Rehabilitation | Admitting: Physical Medicine & Rehabilitation

## 2023-10-12 VITALS — BP 114/82 | HR 84 | Ht <= 58 in | Wt 143.0 lb

## 2023-10-12 DIAGNOSIS — G8114 Spastic hemiplegia affecting left nondominant side: Secondary | ICD-10-CM | POA: Insufficient documentation

## 2023-10-12 MED ORDER — PHENOL 5% (AQUEOUS) FOR INJECTION
5.0000 mL | Status: AC | PRN
Start: 2023-10-12 — End: ?
  Administered 2023-10-12: 5 mL via INTRAMUSCULAR

## 2023-10-12 MED ORDER — INCOBOTULINUMTOXINA 100 UNITS IM SOLR
200.0000 [IU] | Freq: Once | INTRAMUSCULAR | Status: AC
Start: 2023-10-12 — End: 2023-10-12
  Administered 2023-10-12: 200 [IU] via INTRAMUSCULAR

## 2023-10-12 NOTE — Patient Instructions (Signed)
Tibial nerve block with phenol today. This medication may start taking the fact today however full effect will be at about one week Duration of the effect is 3-6 months Side effects of medication may include right heel numbness or burning. Call if you have burning pain so we can recommend any medication for that. 

## 2023-10-12 NOTE — Progress Notes (Addendum)
 Xeomin Injection for spasticity using needle EMG guidance  Dilution: 200 Units/ml Indication: Severe spasticity which interferes with ADL,mobility and/or  hygiene and is unresponsive to medication management and other conservative care Informed consent was obtained after describing risks and benefits of the procedure with the patient. This includes bleeding, bruising, infection, excessive weakness, or medication side effects. A REMS form is on file and signed. Needle:  needle electrode Number of units per muscle 200 units total Left FCR 50 units Left FDS 50 units Left FDP 50 units Left FPL 50 units  All injections were done after obtaining appropriate EMG activity and after negative drawback for blood. The patient tolerated the procedure well. Post procedure instructions were given. A followup appointment was made.   Phenol neurolysis of the LEFT tibial nerve  Indication: Severe spasticity in the plantar flexor muscles which is not responding to medical management and other conservative care and interfering with functional use.  Informed consent was obtained after describing the risks and benefits of the procedure with the patient this includes bleeding bruising and infection as well as medication side effects. The patient elected to proceed and has given written consent. Patient placed in a prone position on the exam table. External DC stimulation was applied to the popliteal space using a nerve stimulator. Plantar flexion twitch was obtained. The popliteal region was prepped with Betadine and then entered with a 22-gauge 40 mm needle electrode under electrical stimulation guidance. Plantar flexion which was obtained and confirmed. Then 4 cc of 5% phenol were injected. The patient tolerated procedure well. Post procedure instructions and followup visit were given.   Continues with left spastic foot drop.  The injection above was done to have a medial area and however will also need a posterior  leaf spring AFO.  Order written on [facility] rx form

## 2023-10-18 ENCOUNTER — Other Ambulatory Visit (HOSPITAL_BASED_OUTPATIENT_CLINIC_OR_DEPARTMENT_OTHER): Payer: Self-pay

## 2023-10-18 ENCOUNTER — Other Ambulatory Visit: Payer: Self-pay | Admitting: Internal Medicine

## 2023-10-18 MED ORDER — CARVEDILOL 25 MG PO TABS
25.0000 mg | ORAL_TABLET | Freq: Two times a day (BID) | ORAL | 1 refills | Status: DC
Start: 2023-10-18 — End: 2024-09-24
  Filled 2023-10-18 – 2024-01-29 (×2): qty 180, 90d supply, fill #0
  Filled 2024-05-10: qty 60, 30d supply, fill #1
  Filled 2024-06-20: qty 60, 30d supply, fill #2
  Filled 2024-08-16: qty 60, 30d supply, fill #3

## 2023-10-27 ENCOUNTER — Ambulatory Visit (INDEPENDENT_AMBULATORY_CARE_PROVIDER_SITE_OTHER): Payer: BC Managed Care – PPO | Admitting: Neurology

## 2023-10-27 ENCOUNTER — Other Ambulatory Visit (HOSPITAL_BASED_OUTPATIENT_CLINIC_OR_DEPARTMENT_OTHER): Payer: Self-pay

## 2023-10-27 ENCOUNTER — Encounter: Payer: Self-pay | Admitting: Neurology

## 2023-10-27 VITALS — BP 107/70 | HR 60 | Ht <= 58 in | Wt 143.0 lb

## 2023-10-27 DIAGNOSIS — I69352 Hemiplegia and hemiparesis following cerebral infarction affecting left dominant side: Secondary | ICD-10-CM

## 2023-10-27 DIAGNOSIS — G3184 Mild cognitive impairment, so stated: Secondary | ICD-10-CM | POA: Diagnosis not present

## 2023-10-27 MED ORDER — CLOPIDOGREL BISULFATE 75 MG PO TABS
75.0000 mg | ORAL_TABLET | Freq: Every day | ORAL | 1 refills | Status: DC
Start: 2023-10-27 — End: 2024-08-16
  Filled 2023-10-27 – 2024-01-29 (×2): qty 90, 90d supply, fill #0
  Filled 2024-05-10: qty 30, 30d supply, fill #1
  Filled 2024-06-20: qty 30, 30d supply, fill #2
  Filled 2024-07-23: qty 30, 30d supply, fill #3

## 2023-10-27 NOTE — Patient Instructions (Signed)
I had a long discussion with the patient with regards to her memory loss and cognitive impairment and discussed plan for evaluation and treatment and answered questions. We also discussed memory compensation strategies and I advised her to increase participation in cognitively challenging activities like solving crossword puzzles, playing bridge and sodoku.  She was advised to follow-up with her primary care physician for more optimal treatment for her depression which could also be contributing. .  Continue Plavix for stroke prevention and maintain aggressive risk factor modification with strict control of hypertension with blood pressure goal below 130/90, lipids with LDL cholesterol goal below 70 mg percent and diabetes with hemoglobin A1c goal below 6.5%. Check screening carotid ultrasound study. She will return for follow-up in the future in 1 year or call earlier if necessary.

## 2023-10-27 NOTE — Progress Notes (Unsigned)
[facility] [address] [place] [place] [address] [phone]       OFFICE FOLLOW UP VISIT NOTE  [name] Date of Birth:  1971/02/06 Medical Record Number:  [id number]   Referring MD:  Willow Ora  Reason for Referral: Memory loss  NUU:VOZDGUY visit 06/11/2021: Abigail Campos is a pleasant 52 year old African-American lady seen today for consultation visit for memory loss.  History is obtained from the patient and review of electronic medical records and I have personally reviewed available imaging films in PACS.  She has past medical history of hypertension, hyperlipidemia, right basal ganglia intracerebral hemorrhage in April 2017 with residual spastic left hemiplegia, depression, sleep apnea, anemia.  Patient states that she is noticing increasing memory loss and short-term memory difficulties for the last 3 years.  She is to initially managed to write things down and get by but of late this has become difficult.  She is often forgotten the way to drive to her mother's house and has recently given up driving as a result.  She is fairly independent in all activities of daily living and can feed dress and bathe herself.  She does have history of anxiety depression and has noted increased mood swings since her menopause few years.  She is on Wellbutrin and recently dose was increased to 300 mg daily and she also takes Lexapro.  She denies any headache, seizures, significant head injury with loss of consciousness.  There is no family history of anybody with Alzheimer's or other dementias.  Patient is able to ambulate with a cane she did not fall.  She remains on Plavix for stroke prevention this complain of bruising but no bleeding.  Blood pressures well controlled today it is 116/76.  Recent lab work for hemoglobin A1c on 05/20/2021 was satisfactory at 5.86 and LDL cholesterol on 12/26/2020 was 61 mg percent.  She was last seen in office for follow-up of on 07/04/2018 and  then lost to follow-up. Update 07/23/2022: She returns for follow-up after last visit more than a year ago.  She states that short-term memory difficulties are about unchanged.  She has learned to be more organized and mentally challenging activities and feels that she is doing all right.  She has not noticed any significant worsening.  She states her blood pressure is under good control and today it is 127/92.  She has had a couple of falls.  In November she fell and fractured her left clavicle which was treated conservatively appears to healing.  She again fell last Tuesday minor fall and did not hurt herself.  She had MRI scan of the brain on 06/30/2021 showed old right basal ganglia hemorrhage.  Mild changes of chronic small vessel disease and no acute abnormalities.  MR angiogram of the brain and neck were both unremarkable.  On 07/10/2021 was normal lab work on 6/29 due to showed normal vitamin B12, TSH, RPR and homocystine levels.  Mental status exam testing today she scored 25/30 which is slightly lower than 27/30 at last visit.  She has no new complaints.  She is living with her husband.  She is able to ambulate with a cane and is mostly independent in activities of daily living. Update 10/28/2023 : She returns for follow-up after last visit more than a year ago.  Patient states she is doing well.  She does do daily puzzles and word searches.  She has not had any cognitive worsening.  She is still independent actives of daily living.  She ambulates with a cane.  She has had no recent falls.  She is tolerating Plavix well without side effects.  She has had no stroke or TIA symptoms.  She states her blood pressure is under good control.  She is tolerating Lipitor well without side effects.  She had recent lab work in September by her primary care physician which was all satisfactory. ROS:   14 system review of systems is positive for falls, memory loss, forgetfulness, confusion, weakness, gait difficulty,  bruising and all other systems negative  PMH:  Past Medical History:  Diagnosis Date   Allergy    Anemia    Anxiety and depression    Arthritis    Back pain    Blood transfusion without reported diagnosis    Essential hypertension 10/19/2007   03-2010: metoprolol changed to bystolic (was not feeling well on it, no specific allergy or reaction)     Food allergy    Hemiplegia (HCC)    Hyperlipemia    Hypertension    Joint pain    Migraine    Neuromuscular disorder (HCC)    left sided hemiplegia - has brace for l leg and walks with a cane   Obesity    Ovarian cancer (HCC)    Prediabetes    Sleep apnea    not wearing c-pap yet   Sleep apnea    Stroke (HCC) 2017   Vitamin D deficiency     Social History:  Social History   Socioeconomic History   Marital status: Married    Spouse name: Vonna Kotyk   Number of children: 2   Years of education: Not on file   Highest education level: Associate degree: occupational, Scientist, product/process development, or vocational program  Occupational History   Occupation: disable d/t stroke TEACHER    Employer: [employer]  Tobacco Use   Smoking status: Never   Smokeless tobacco: Never  Vaping Use   Vaping status: Never Used  Substance and Sexual Activity   Alcohol use: No   Drug use: No   Sexual activity: Not Currently    Birth control/protection: Surgical  Other Topics Concern   Not on file  Social History Narrative   Lives with husband and son (2007)   Left Hand prior to stroke   Drinks 1-2 cups daily   Daughter (1998), married    Son 2008   Social Determinants of Health   Financial Resource Strain: High Risk (09/29/2023)   Overall Financial Resource Strain (CARDIA)    Difficulty of Paying Living Expenses: Hard  Food Insecurity: Patient Declined (09/29/2023)   Hunger Vital Sign    Worried About Running Out of Food in the Last Year: Patient declined    Ran Out of Food in the Last Year: Patient declined  Recent Concern: Food Insecurity - Food  Insecurity Present (08/09/2023)   Received from [facility]   Hunger Vital Sign    Worried About Running Out of Food in the Last Year: Sometimes true    Ran Out of Food in the Last Year: Sometimes true  Transportation Needs: Patient Declined (09/29/2023)   PRAPARE - Transportation    Lack of Transportation (Medical): Patient declined    Lack of Transportation (Non-Medical): Patient declined  Physical Activity: Insufficiently Active (09/29/2023)   Exercise Vital Sign    Days of Exercise per Week: 4 days    Minutes of Exercise per Session: 30 min  Stress: Stress Concern Present (09/29/2023)   [employer] of Occupational Health - Occupational Stress  Questionnaire    Feeling of Stress : Rather much  Social Connections: Moderately Integrated (09/29/2023)   Social Connection and Isolation Panel [NHANES]    Frequency of Communication with Friends and Family: Once a week    Frequency of Social Gatherings with Friends and Family: Once a week    Attends Religious Services: More than 4 times per year    Active Member of [employer] or Organizations: Yes    Attends Banker Meetings: Patient declined    Marital Status: Married  Recent Concern: Social Connections - Socially Isolated (08/09/2023)   Received from [employer]   Social Network    How would you rate your social network (family, work, friends)?: Little participation, lonely and socially isolated  Intimate Partner Violence: Not At Risk (08/09/2023)   Received from [facility]   HITS    Over the last 12 months how often did your partner physically hurt you?: Never    Over the last 12 months how often did your partner insult you or talk down to you?: Never    Over the last 12 months how often did your partner threaten you with physical harm?: Never    Over the last 12 months how often did your partner scream or curse at you?: Rarely    Medications:   Current Outpatient Medications on File Prior to Visit  Medication  Sig Dispense Refill   alendronate (FOSAMAX) 70 MG tablet Take 1 tablet (70 mg total) by mouth once a week with a full glass of water on an empty stomach. 4 tablet 11   amLODipine (NORVASC) 5 MG tablet Take 1 tablet (5 mg total) by mouth daily. 90 tablet 1   atorvastatin (LIPITOR) 20 MG tablet Take 1 tablet (20 mg total) by mouth daily. 90 tablet 1   baclofen (LIORESAL) 10 MG tablet Take 1 tablet (10 mg total) by mouth daily. 30 each 11   buPROPion (WELLBUTRIN XL) 150 MG 24 hr tablet Take 1 tablet (150 mg total) by mouth daily. 90 tablet 1   carvedilol (COREG) 25 MG tablet Take 1 tablet (25 mg total) by mouth 2 (two) times daily with a meal. 180 tablet 1   Cholecalciferol (VITAMIN D3) 1.25 MG (50000 UT) CAPS Take 1 capsule by mouth once a week.     Cholecalciferol 1.25 MG (50000 UT) capsule Take 1 capsule (50,000 Units total) by mouth once a week. 12 capsule 3   clopidogrel (PLAVIX) 75 MG tablet Take 1 tablet (75 mg total) by mouth daily. 90 tablet 1   escitalopram (LEXAPRO) 20 MG tablet Take 1 tablet (20 mg total) by mouth daily. 90 tablet 1   fluconazole (DIFLUCAN) 150 MG tablet Take 1 tablet (150 mg total) by mouth daily. MAY REPEAT IN 3 DAYS AS NEEDED 2 tablet 0   losartan (COZAAR) 100 MG tablet Take 1 tablet (100 mg total) by mouth daily. 90 tablet 1   naltrexone (DEPADE) 50 MG tablet Take by mouth.     prochlorperazine (COMPAZINE) 10 MG tablet Take by mouth.     Semaglutide-Weight Management 2.4 MG/0.75ML SOAJ Inject 2.4 mg into the skin once a week. 3 mL 1   tiZANidine (ZANAFLEX) 2 MG tablet TAKE 2 TABLETS (4 MG TOTAL) BY MOUTH 3 (THREE) TIMES DAILY. 810 tablet 1   traMADol (ULTRAM) 50 MG tablet Take 50 mg by mouth 3 (three) times daily as needed.     traZODone (DESYREL) 100 MG tablet Take 1 tablet (100 mg total) by mouth  at bedtime as needed for sleep. 90 tablet 1   Current Facility-Administered Medications on File Prior to Visit  Medication Dose Route Frequency Provider Last Rate Last  Admin   incobotulinumtoxinA (XEOMIN) 100 units injection 100 Units  100 Units Intramuscular Once        incobotulinumtoxinA (XEOMIN) 100 units injection 200 Units  200 Units Intramuscular Once        PHENOL 5% (AQUEOUS) FOR INJECTION 5 mL  5 mL Injection PRN    5 mL at 10/12/23 1236    Allergies:   Allergies  Allergen Reactions   Bee Venom Anaphylaxis   Iopamidol Hives and Itching    Pt broke out with one facial hive.  Had itching on her right upper ant and posterior arm w/o evidence of hives.  Pt given water and 25mg  benadryl po.  We observed pt for 30 minutes before d/c.  J. Bohm   Peanut-Containing Drug Products Anaphylaxis   Aspirin Other (See Comments)    Reports blisters w/ ASA but pt reports is ok w/ Motrin, Advil, naproxen   Latex Hives   Zolpidem Tartrate Other (See Comments) and Hives    forgetfulness   Hydrocodone Itching    Generalized itching w/o rash    Physical Exam General: Mildly obese middle-aged African-American lady, seated, in no evident distress Head: head normocephalic and atraumatic.   Neck: supple with no carotid or supraclavicular bruits Cardiovascular: regular rate and rhythm, no murmurs Musculoskeletal: no deformity Skin:  no rash/petichiae Vascular:  Normal pulses all extremities  Neurologic Exam Mental Status: Awake and fully alert. Oriented to place and time. Recent and remote memory intact. Attention span, concentration and fund of knowledge appropriate. Mood and affect appropriate.  Recall 3/3.  Able to name 13 animals which can walk on 4 legs.  Clock drawing 4/4.  Mini-Mental status exam score 28/30 ( last visit 25/30 )with deficits in attention and calculation and recall only.   . Cranial Nerves: Fundoscopic exam reveals sharp disc margins. Pupils equal, briskly reactive to light. Extraocular movements full without nystagmus. Visual fields full to confrontation. Hearing intact. Facial sensation intact. Face, tongue, palate moves normally and  symmetrically.  Motor: Spastic left hemiplegia with left upper extremity 0/5 strength in left lower extremity 4/5 strength except left ankle foot drop.  Tone is increased in the left leg mild spasticity.  Normal strength on the right. Sensory.:  Diminished left hemibody touch , pinprick , position and vibratory sensation.  Coordination: Rapid alternating movements normal on the right and cannot be tested appropriately on the left.   Gait and Station: Arises from chair without difficulty. Stance is normal. Gait is spastic ataxic with circumduction and left foot drop and is wearing a left foot brace..  Reflexes: 1+ and symmetric. Toes downgoing.   NIHSS 6 Modified Rankin 3      10/27/2023   12:47 PM 07/23/2022    9:28 AM 06/11/2021   11:10 AM  MMSE - Mini Mental State Exam  Orientation to time 5 5 5   Orientation to Place 5 5 5   Registration 3 3 3   Attention/ Calculation 5 2 3   Recall 2 2 2   Language- name 2 objects 2 2 2   Language- repeat 1 1 1   Language- follow 3 step command 3 3 3   Language- read & follow direction 1 1 1   Write a sentence 1 1 1   Copy design 0 0 1  Total score 28 25 27      ASSESSMENT: 52 year old African-American  lady with spastic left hemiplegia secondary to right basal ganglia hypertensive hemorrhage and April 2017 but now complains of subjective memory and cognitive worsening likely due to mild cognitive impairment  which appears stable     PLAN:I had a long discussion with the patient with regards to her memory loss and cognitive impairment and discussed plan for evaluation and treatment and answered questions. We also discussed memory compensation strategies and I advised her to increase participation in cognitively challenging activities like solving crossword puzzles, playing bridge and sodoku.  She was advised to follow-up with her primary care physician for more optimal treatment for her depression which could also be contributing. .  Continue Plavix for  stroke prevention and maintain aggressive risk factor modification with strict control of hypertension with blood pressure goal below 130/90, lipids with LDL cholesterol goal below 70 mg percent and diabetes with hemoglobin A1c goal below 6.5%. Check screening carotid ultrasound study. She will return for follow-up in the future in 1 year or call earlier if necessary. Greater than 50% time during this 35-minute visit was spent on counseling and coordination of care about her remote stroke, spastic hemiplegia, falls and memory loss and cognitive impairment and answering questions.  Delia Heady MD  Note: This document was prepared with digital dictation and possible smart phrase technology. Any transcriptional errors that result from this process are unintentional.

## 2023-10-31 NOTE — Progress Notes (Unsigned)
HPI F  never smoker followed for OSA, Insomnia, complicated by Migraine, HBP, CVA 2017 (Basal Ganglia ICH)/ aphasia/ dysphagia/ spastic L hemiparesis, Upper Airway Cough, Depression, GERD HST 08/28/2019- AHI 12.3/ hr, desaturation to 75%, body weight 168 lbs  -------------------------------------------------------------------------------------------------  11/18/21- 50 yoF never smoker followed for OSA, Insomnia, complicated by Migraine, HTN, CVA 2017 (Basal Ganglia ICH)/ aphasia/ dysphagia/ spastic L hemiparesis, Upper Airway Cough, Depression, GERD Oral Appliance-Katz Body weight today-156 lbs Covid vax- 5 Phizer Flu vax- had Weight loss confirmed.  She never got oral appliance from Dr. Myrtis Ser because he wanted other dental work done first and her insurance would not cover.  She does not remember Sonata and may never have tried it.  Limits herself to 1 cup of coffee in the morning, otherwise she will not sleep well at night.  We discussed this as an insomnia issue, not excessive tiredness as might be expected from OSA.  Husband tells her she snores if really tired. CXR 1V 11/01/21- IMPRESSION: Distal left clavicular fracture. No acute intrathoracic abnormality noted.  11/01/23- 51 yoF never smoker followed for OSA, Insomnia, complicated by Migraine, HTN, CVA 2017 (Basal Ganglia ICH)/ aphasia/ dysphagia/ spastic L hemiparesis, Upper Airway Cough, Depression, GERD Oral Appliance-Katz- never got. Ins wouldn't cover nec dental work. Body weight today-143 lbs ED 09/10/23 for Difficulty sleeping, low BP > fluids, melatonin. Had Covid.   ROS-see HPI   + = positive Constitutional:    weight loss, night sweats, fevers, chills, fatigue, lassitude. HEENT:    +headaches, difficulty swallowing, tooth/dental problems, sore throat,       sneezing, itching, ear ache, nasal congestion, post nasal drip, snoring CV:    chest pain, orthopnea, PND, swelling in lower extremities, anasarca,                                    dizziness, palpitations Resp:   shortness of breath with exertion or at rest.                productive cough,   non-productive cough, coughing up of blood.              change in color of mucus.  wheezing.   Skin:    rash or lesions. GI:  No-   heartburn, indigestion, abdominal pain, nausea, vomiting, diarrhea,                 change in bowel habits, loss of appetite GU: dysuria, change in color of urine, no urgency or frequency.   flank pain. MS:   joint pain, stiffness, decreased range of motion, back pain. Neuro-   +L spastic hemiparesis Psych:  change in mood or affect.  depression or anxiety.   memory loss.  OBJ- Physical Exam General- Alert, Oriented, Affect-appropriate, Distress- none acute,  Skin- rash-none, lesions- none, excoriation- none Lymphadenopathy- none Head- atraumatic            Eyes- Gross vision intact, PERRLA, conjunctivae and secretions clear            Ears- Hearing, canals-normal            Nose- Clear, no-Septal dev, mucus, polyps, erosion, perforation             Throat- Mallampati II-III , mucosa clear , drainage- none, tonsils small, + teeth Neck- flexible , trachea midline, no stridor , thyroid nl, carotid no bruit Chest - symmetrical excursion , unlabored  Heart/CV- RRR , no murmur , no gallop  , no rub, nl s1 s2                           - JVD- none , edema- none, stasis changes- none, varices- none           Lung- clear to P&A, wheeze- none, cough- none , dullness-none, rub- none           Chest wall-  Abd-  Br/ Gen/ Rectal- Not done, not indicated Extrem- +splint L calf, + cane, + left arm in sling Neuro- +L spastic hemiplegic.

## 2023-11-01 ENCOUNTER — Encounter: Payer: Self-pay | Admitting: Internal Medicine

## 2023-11-01 ENCOUNTER — Ambulatory Visit: Payer: BC Managed Care – PPO | Admitting: Internal Medicine

## 2023-11-01 VITALS — BP 112/73 | HR 69 | Ht <= 58 in | Wt 143.0 lb

## 2023-11-01 DIAGNOSIS — G4733 Obstructive sleep apnea (adult) (pediatric): Secondary | ICD-10-CM | POA: Diagnosis not present

## 2023-11-01 DIAGNOSIS — F5101 Primary insomnia: Secondary | ICD-10-CM

## 2023-11-01 NOTE — Patient Instructions (Signed)
Glad you are doing better.   Ok to continue trazodone for insomnia if it helps.  I you begin again to feel that your sleep is a problem, we can update your sleep study . We will be happy to see you again if we can help.

## 2023-11-09 DIAGNOSIS — D649 Anemia, unspecified: Secondary | ICD-10-CM | POA: Diagnosis not present

## 2023-11-09 DIAGNOSIS — D72819 Decreased white blood cell count, unspecified: Secondary | ICD-10-CM | POA: Diagnosis not present

## 2023-11-16 ENCOUNTER — Other Ambulatory Visit (HOSPITAL_BASED_OUTPATIENT_CLINIC_OR_DEPARTMENT_OTHER): Payer: Self-pay

## 2023-11-16 ENCOUNTER — Telehealth: Payer: Self-pay | Admitting: Internal Medicine

## 2023-11-16 ENCOUNTER — Other Ambulatory Visit: Payer: Self-pay | Admitting: Physical Medicine & Rehabilitation

## 2023-11-16 MED ORDER — LOSARTAN POTASSIUM 100 MG PO TABS
100.0000 mg | ORAL_TABLET | Freq: Every day | ORAL | 1 refills | Status: DC
  Filled 2023-11-16: qty 90, 90d supply, fill #0
  Filled 2024-02-16: qty 90, 90d supply, fill #1
  Filled 2024-02-18: qty 30, 30d supply, fill #1
  Filled 2024-03-20: qty 60, 60d supply, fill #2

## 2023-11-16 MED ORDER — NALTREXONE HCL 50 MG PO TABS
25.0000 mg | ORAL_TABLET | Freq: Every day | ORAL | 1 refills | Status: DC
  Filled 2023-11-19: qty 30, 60d supply, fill #0

## 2023-11-16 MED ORDER — BUPROPION HCL ER (XL) 150 MG PO TB24
150.0000 mg | ORAL_TABLET | Freq: Every day | ORAL | 3 refills | Status: DC
  Filled 2024-01-03: qty 90, 90d supply, fill #0

## 2023-11-16 MED ORDER — TIZANIDINE HCL 2 MG PO TABS
4.0000 mg | ORAL_TABLET | Freq: Three times a day (TID) | ORAL | 1 refills | Status: DC
  Filled 2023-11-16: qty 180, 30d supply, fill #0
  Filled 2023-12-30: qty 180, 30d supply, fill #1
  Filled 2024-02-16: qty 180, 30d supply, fill #2
  Filled 2024-03-20: qty 180, 30d supply, fill #3
  Filled 2024-04-25 – 2024-05-10 (×2): qty 180, 30d supply, fill #4
  Filled 2024-06-20: qty 180, 30d supply, fill #5
  Filled 2024-08-16: qty 180, 30d supply, fill #6

## 2023-11-16 MED ORDER — SEMAGLUTIDE-WEIGHT MANAGEMENT 0.5 MG/0.5ML ~~LOC~~ SOAJ: 0.5000 mg | SUBCUTANEOUS | 1 refills | Status: DC

## 2023-11-16 MED FILL — Trazodone HCl Tab 100 MG: ORAL | 90 days supply | Qty: 90 | Fill #0 | Status: AC

## 2023-11-16 NOTE — Telephone Encounter (Signed)
Pt needs refill of lasartin and tanzanine sent downstairs to pharmacy, Pt asked that this be high priority since she is out. Pt is already downstairs waiting for other meds sos would like called in now

## 2023-11-16 NOTE — Telephone Encounter (Signed)
Losartan sent, however Dr. Wynn Banker prescribes her tizanidine.

## 2023-11-17 ENCOUNTER — Other Ambulatory Visit: Payer: Self-pay

## 2023-11-17 ENCOUNTER — Other Ambulatory Visit (HOSPITAL_BASED_OUTPATIENT_CLINIC_OR_DEPARTMENT_OTHER): Payer: Self-pay

## 2023-11-19 ENCOUNTER — Ambulatory Visit (HOSPITAL_COMMUNITY)
Admission: RE | Admit: 2023-11-19 | Discharge: 2023-11-19 | Disposition: A | Discharge status: Home / Self Care | Payer: BC Managed Care – PPO | Source: Ambulatory Visit | Attending: Neurology | Admitting: Neurology

## 2023-11-19 DIAGNOSIS — I69352 Hemiplegia and hemiparesis following cerebral infarction affecting left dominant side: Secondary | ICD-10-CM | POA: Diagnosis not present

## 2023-11-19 DIAGNOSIS — G3184 Mild cognitive impairment, so stated: Secondary | ICD-10-CM | POA: Diagnosis not present

## 2023-11-22 ENCOUNTER — Other Ambulatory Visit (HOSPITAL_BASED_OUTPATIENT_CLINIC_OR_DEPARTMENT_OTHER): Payer: Self-pay

## 2023-11-23 DIAGNOSIS — E66811 Obesity, class 1: Secondary | ICD-10-CM | POA: Diagnosis not present

## 2023-11-23 DIAGNOSIS — Z8673 Personal history of transient ischemic attack (TIA), and cerebral infarction without residual deficits: Secondary | ICD-10-CM | POA: Diagnosis not present

## 2023-11-23 DIAGNOSIS — I10 Essential (primary) hypertension: Secondary | ICD-10-CM | POA: Diagnosis not present

## 2023-11-23 DIAGNOSIS — R632 Polyphagia: Secondary | ICD-10-CM | POA: Diagnosis not present

## 2023-11-23 DIAGNOSIS — Z6831 Body mass index (BMI) 31.0-31.9, adult: Secondary | ICD-10-CM | POA: Diagnosis not present

## 2023-11-23 DIAGNOSIS — Z599 Problem related to housing and economic circumstances, unspecified: Secondary | ICD-10-CM | POA: Diagnosis not present

## 2023-11-23 DIAGNOSIS — M21372 Foot drop, left foot: Secondary | ICD-10-CM | POA: Diagnosis not present

## 2023-11-25 ENCOUNTER — Telehealth: Payer: Self-pay | Admitting: *Deleted

## 2023-11-25 NOTE — Telephone Encounter (Signed)
-----   Message from Abigail Campos sent at 11/25/2023  7:02 AM EST ----- Kindly inform the patient that carotid ultrasound study shows no significant narrowing of either carotid artery in the neck

## 2023-11-25 NOTE — Progress Notes (Signed)
Kindly inform the patient that carotid ultrasound study shows no significant narrowing of either carotid artery in the neck

## 2023-11-25 NOTE — Telephone Encounter (Signed)
LVM for pt to call back to discuss test results.

## 2023-11-25 NOTE — Telephone Encounter (Signed)
Spoke to patient gave ultrasound results Pt expressed understanding and thanked me for calling

## 2023-11-25 NOTE — Telephone Encounter (Signed)
See other result note.

## 2023-12-02 ENCOUNTER — Other Ambulatory Visit (HOSPITAL_BASED_OUTPATIENT_CLINIC_OR_DEPARTMENT_OTHER): Payer: Self-pay

## 2023-12-09 DIAGNOSIS — M21372 Foot drop, left foot: Secondary | ICD-10-CM | POA: Diagnosis not present

## 2023-12-16 ENCOUNTER — Encounter: Payer: Self-pay | Admitting: Internal Medicine

## 2023-12-16 NOTE — Assessment & Plan Note (Signed)
 Doing much better with trazodone from PCP

## 2023-12-16 NOTE — Assessment & Plan Note (Signed)
 Symptomatically resolved after weight loss. Plan- She feels she can keep her weight down. We will be happy to see her again if we can help.

## 2023-12-20 ENCOUNTER — Ambulatory Visit: Payer: No Typology Code available for payment source | Admitting: Neurology

## 2023-12-20 DIAGNOSIS — Z6830 Body mass index (BMI) 30.0-30.9, adult: Secondary | ICD-10-CM | POA: Diagnosis not present

## 2023-12-20 DIAGNOSIS — I10 Essential (primary) hypertension: Secondary | ICD-10-CM | POA: Diagnosis not present

## 2023-12-20 DIAGNOSIS — E66811 Obesity, class 1: Secondary | ICD-10-CM | POA: Diagnosis not present

## 2023-12-20 DIAGNOSIS — E6609 Other obesity due to excess calories: Secondary | ICD-10-CM | POA: Diagnosis not present

## 2023-12-25 ENCOUNTER — Other Ambulatory Visit: Payer: Self-pay

## 2023-12-25 ENCOUNTER — Encounter (HOSPITAL_BASED_OUTPATIENT_CLINIC_OR_DEPARTMENT_OTHER): Payer: Self-pay | Admitting: Emergency Medicine

## 2023-12-25 ENCOUNTER — Emergency Department (HOSPITAL_BASED_OUTPATIENT_CLINIC_OR_DEPARTMENT_OTHER)
Admission: EM | Admit: 2023-12-25 | Discharge: 2023-12-25 | Disposition: A | Discharge status: Home / Self Care | Payer: BC Managed Care – PPO | Attending: Emergency Medicine | Admitting: Emergency Medicine

## 2023-12-25 DIAGNOSIS — Z91010 Allergy to peanuts: Secondary | ICD-10-CM | POA: Insufficient documentation

## 2023-12-25 DIAGNOSIS — Z91040 Latex allergy status: Secondary | ICD-10-CM | POA: Diagnosis not present

## 2023-12-25 DIAGNOSIS — Z79899 Other long term (current) drug therapy: Secondary | ICD-10-CM | POA: Diagnosis not present

## 2023-12-25 DIAGNOSIS — Z8673 Personal history of transient ischemic attack (TIA), and cerebral infarction without residual deficits: Secondary | ICD-10-CM | POA: Insufficient documentation

## 2023-12-25 DIAGNOSIS — Z7902 Long term (current) use of antithrombotics/antiplatelets: Secondary | ICD-10-CM | POA: Diagnosis not present

## 2023-12-25 DIAGNOSIS — I10 Essential (primary) hypertension: Secondary | ICD-10-CM | POA: Insufficient documentation

## 2023-12-25 DIAGNOSIS — K0889 Other specified disorders of teeth and supporting structures: Secondary | ICD-10-CM | POA: Diagnosis not present

## 2023-12-25 MED ORDER — IBUPROFEN 400 MG PO TABS
600.0000 mg | ORAL_TABLET | Freq: Once | ORAL | Status: AC
  Administered 2023-12-25: 600 mg via ORAL
  Filled 2023-12-25: qty 1

## 2023-12-25 MED ORDER — OXYCODONE-ACETAMINOPHEN 5-325 MG PO TABS
1.0000 | ORAL_TABLET | Freq: Once | ORAL | Status: AC
  Administered 2023-12-25: 1 via ORAL
  Filled 2023-12-25: qty 1

## 2023-12-25 MED ORDER — CLINDAMYCIN HCL 150 MG PO CAPS
300.0000 mg | ORAL_CAPSULE | Freq: Once | ORAL | Status: AC
  Administered 2023-12-25: 300 mg via ORAL
  Filled 2023-12-25: qty 2

## 2023-12-25 MED ORDER — CLINDAMYCIN HCL 300 MG PO CAPS: 300.0000 mg | ORAL_CAPSULE | Freq: Three times a day (TID) | ORAL | 0 refills | Status: AC

## 2023-12-25 NOTE — Discharge Instructions (Addendum)
 You have been prescribed clindamycin . Take this antibiotic 3 times a day for the next 7 days. Take the full course of your antibiotic even if you start feeling better. Antibiotics may cause you to have diarrhea.   You may use up to 600mg  ibuprofen  every 6 hours as needed for pain.  Do not exceed 2.4g of ibuprofen  per day. You were given your first dose here today.  Please follow-up with your dentist as soon as possible for further evaluation.  Return to the ER if you are unable to open your mouth, occultly breathing, fevers, any other new or concerning symptoms.

## 2023-12-25 NOTE — ED Triage Notes (Signed)
 Pt sts she grinds her teeth and she thinks she cracked one last night (LT lower); facial swelling present

## 2023-12-25 NOTE — ED Provider Notes (Signed)
 [place] [facility] Provider Note   [name]: [id number] Arrival date & time: 12/25/23  1843     History  Chief Complaint  Patient presents with   Dental Pain    Abigail Campos is a 53 y.o. female with history of hypertension, stroke, hyperlipidemia, presents with concern for left-sided jaw swelling that began this morning.  States she sometimes grinds her teeth in her sleep, and wonders if the swelling is secondary to this.  Reports the area is painful.  Denies any fever or chills.  Denies any recent dental work.  Denies any difficulty swallowing or breathing.   Dental Pain Associated symptoms: no fever        Home Medications Prior to Admission medications   Medication Sig Start Date End Date Taking? Authorizing Provider  clindamycin  (CLEOCIN ) 300 MG capsule Take 1 capsule (300 mg total) by mouth 3 (three) times daily for 7 days. 12/25/23 01/01/24 Yes Veta Palma, PA-C  alendronate  (FOSAMAX ) 70 MG tablet Take 1 tablet (70 mg total) by mouth once a week with a full glass of water on an empty stomach. 05/06/23     amLODipine  (NORVASC ) 5 MG tablet Take 1 tablet (5 mg total) by mouth daily. 10/11/23   Amon Aloysius BRAVO, MD  atorvastatin  (LIPITOR) 20 MG tablet Take 1 tablet (20 mg total) by mouth daily. 04/19/23   Amon Aloysius BRAVO, MD  baclofen  (LIORESAL ) 10 MG tablet Take 1 tablet (10 mg total) by mouth daily. 10/11/23   Kirsteins, Prentice BRAVO, MD  buPROPion  (WELLBUTRIN  XL) 150 MG 24 hr tablet Take 1 tablet (150 mg total) by mouth daily. 10/06/23   Amon Aloysius BRAVO, MD  buPROPion  (WELLBUTRIN  XL) 150 MG 24 hr tablet Take 1 tablet (150 mg total) by mouth daily. 10/18/23   Prentiss Frieze, DO  carvedilol  (COREG ) 25 MG tablet Take 1 tablet (25 mg total) by mouth 2 (two) times daily with a meal. 10/18/23   Paz, Aloysius BRAVO, MD  Cholecalciferol  (VITAMIN D3) 1.25 MG (50000 UT) CAPS Take 1 capsule by mouth once a week. 07/13/23   [provider]  Cholecalciferol  1.25 MG  (50000 UT) capsule Take 1 capsule (50,000 Units total) by mouth once a week. 07/13/23     clopidogrel  (PLAVIX ) 75 MG tablet Take 1 tablet (75 mg total) by mouth daily. 10/27/23   Rosemarie Eather RAMAN, MD  escitalopram  (LEXAPRO ) 20 MG tablet Take 1 tablet (20 mg total) by mouth daily. 09/10/23   Amon Aloysius BRAVO, MD  fluconazole  (DIFLUCAN ) 150 MG tablet Take 1 tablet (150 mg total) by mouth daily. MAY REPEAT IN 3 DAYS AS NEEDED 10/06/23   Paz, Jose E, MD  losartan  (COZAAR ) 100 MG tablet Take 1 tablet (100 mg total) by mouth daily. 11/16/23   Amon Aloysius BRAVO, MD  naltrexone  (DEPADE) 50 MG tablet Take by mouth. 07/12/23   [provider]  naltrexone  (DEPADE) 50 MG tablet Take 0.5 tablets (25 mg total) by mouth daily. 10/01/23   Prentiss Frieze, DO  prochlorperazine  (COMPAZINE ) 10 MG tablet Take by mouth. 02/28/23   [provider]  Semaglutide -Weight Management 0.5 MG/0.5ML SOAJ Inject 0.5 mg into the skin once a week. 06/08/23   Prentiss Frieze, DO  Semaglutide -Weight Management 2.4 MG/0.75ML SOAJ Inject 2.4 mg into the skin once a week. 04/20/23     tiZANidine  (ZANAFLEX ) 2 MG tablet Take 2 tablets (4 mg total) by mouth 3 (three) times daily. 09/23/23   Kirsteins, Prentice BRAVO, MD  traMADol  (ULTRAM ) 50 MG tablet Take 50 mg by mouth 3 (three) times daily as needed. 03/26/23   [provider]  traZODone  (DESYREL ) 100 MG tablet Take 1 tablet (100 mg total) by mouth at bedtime as needed for sleep. 03/15/23   Paz, Jose E, MD      Allergies    Bee venom, Iopamidol , Peanut-containing drug products, Aspirin, Latex, Zolpidem  tartrate, and Hydrocodone     Review of Systems   Review of Systems  Constitutional:  Negative for fever.    Physical Exam Updated Vital Signs BP 126/76 (BP Location: Right Arm)   Pulse 88   Temp 98.7 F (37.1 C) (Oral)   Resp 20   Ht 4' 10 (1.473 m)   Wt 64.9 kg   SpO2 96%   BMI 29.90 kg/m  Physical Exam Vitals and nursing note reviewed.  Constitutional:      Appearance:  Normal appearance.  HENT:     Head: Atraumatic.     Mouth/Throat:     Comments: Soft tissue edema overlying the left mandible.  No erythema.    No abscess visualized or palpated, no areas of fluctuance.  Patient with poor dentition throughout oral cavity with multiple dental caries.  No obvious dental abscess visualized.  No trismus to open mouth fully  Neck soft and supple  Swallows without difficulty Cardiovascular:     Rate and Rhythm: Normal rate and regular rhythm.  Pulmonary:     Effort: Pulmonary effort is normal.  Neurological:     General: No focal deficit present.     Mental Status: She is alert.  Psychiatric:        Mood and Affect: Mood normal.        Behavior: Behavior normal.     ED Results / Procedures / Treatments   Labs (all labs ordered are listed, but only abnormal results are displayed) Labs Reviewed - No data to display  EKG None  Radiology No results found.  Procedures Procedures    Medications Ordered in ED Medications  oxyCODONE -acetaminophen  (PERCOCET/ROXICET) 5-325 MG per tablet 1 tablet (1 tablet Oral Given 12/25/23 1920)  ibuprofen  (ADVIL ) tablet 600 mg (600 mg Oral Given 12/25/23 1920)  clindamycin  (CLEOCIN ) capsule 300 mg (300 mg Oral Given 12/25/23 1920)    ED Course/ Medical Decision Making/ A&P Clinical Course as of 12/25/23 1943  Sat Dec 25, 2023  1913 This is a 54 year old female presented to ED with pain and swelling along the angle of the mandible on the left side of her jaw.  She noted this over the past 24 hours.  She says she has cracked teeth on the lower side and does not fact have a cavity and fracture of her left lower molars.  She has a education officer, community but has not had an appointment set up to see them.  On exam she has swelling with some overlying warmth along the left angle of the mandible, no significant fluctuance, no visible peridental abscess with ROM and her oral exam.  This may be a tracking dental infection versus salivary  gland obstruction.  I have a low suspicion for Ludwig's angina, deep space infection.  No indication for CT imaging or labs at this time.  We will start the patient on clindamycin , Percocet given a single dose here for pain, and Motrin  here. [MT]    Clinical Course User Index [MT] Trifan, Donnice PARAS, MD  Medical Decision Making    Differential diagnosis includes but is not limited to dental infection, dental caries, peritonsillar abscess, salivary stone or duct obsctruction, parotitis   ED Course:  Patient well-appearing, afebrile, not tachycardic.  No trismus, able to swallow without difficulty.  No obvious dental abscess or areas of fluctuance appreciated.  She does have soft tissue swelling over the lower left mandible with no overlying erythema.  Patient does have poor dentition throughout mouth.  Neck soft and supple, no concern for Ludwig angina at this time. Dr. Cottie also evaluated patient and feels this could be early dental infection vs salivary gland obstruction. No indication for further CT imaging at this time or labs. Given first dose of clindamycin  in the ER Given ibuprofen  and percocet for pain   Patient stable and appropriate for discharge home.  She states she has a education officer, community and I discussed that she needs to call as soon as possible to make a follow-up appointment with them for further management.  She verbalized understanding.  Impression: Left-sided soft tissue swelling overlying lower mandible Dental pain  Disposition:  The patient was discharged home with instructions to take course of clindamycin  as prescribed.  Tylenol  and ibuprofen  for pain.  Follow-up with dentist as soon as possible for further evaluation management. Return precautions given.            Final Clinical Impression(s) / ED Diagnoses Final diagnoses:  Pain, dental    Rx / DC Orders ED Discharge Orders          Ordered    clindamycin  (CLEOCIN ) 300 MG  capsule  3 times daily        12/25/23 1923              Veta Palma, PA-C 12/25/23 1943    Cottie Donnice PARAS, MD 12/25/23 2030

## 2023-12-30 ENCOUNTER — Other Ambulatory Visit (HOSPITAL_BASED_OUTPATIENT_CLINIC_OR_DEPARTMENT_OTHER): Payer: Self-pay

## 2024-01-03 ENCOUNTER — Other Ambulatory Visit (HOSPITAL_BASED_OUTPATIENT_CLINIC_OR_DEPARTMENT_OTHER): Payer: Self-pay

## 2024-01-13 ENCOUNTER — Encounter: Payer: Self-pay | Admitting: Physical Medicine & Rehabilitation

## 2024-01-13 ENCOUNTER — Encounter: Payer: Medicare Other | Attending: Physical Medicine & Rehabilitation | Admitting: Physical Medicine & Rehabilitation

## 2024-01-13 VITALS — BP 118/83 | HR 73 | Ht <= 58 in | Wt 144.0 lb

## 2024-01-13 DIAGNOSIS — G8114 Spastic hemiplegia affecting left nondominant side: Secondary | ICD-10-CM | POA: Diagnosis not present

## 2024-01-13 MED ORDER — SODIUM CHLORIDE (PF) 0.9 % IJ SOLN
4.0000 mL | Freq: Once | INTRAMUSCULAR | Status: AC
Start: 2024-01-13 — End: 2024-01-13
  Administered 2024-01-13: 4 mL via INTRAVENOUS

## 2024-01-13 MED ORDER — INCOBOTULINUMTOXINA 100 UNITS IM SOLR
100.0000 [IU] | Freq: Once | INTRAMUSCULAR | Status: AC
Start: 2024-01-13 — End: 2024-01-13
  Administered 2024-01-13: 200 [IU] via INTRAMUSCULAR

## 2024-01-13 NOTE — Progress Notes (Signed)
Xeomin Injection for spasticity using needle EMG guidance  Dilution: 200 Units/ml Indication: Severe spasticity which interferes with ADL,mobility and/or  hygiene and is unresponsive to medication management and other conservative care Informed consent was obtained after describing risks and benefits of the procedure with the patient. This includes bleeding, bruising, infection, excessive weakness, or medication side effects. A REMS form is on file and signed. Needle:  needle electrode Number of units per muscle 200 units total Left FCR 50 units Left FDS 50 units Left FDP 50 units Left FPL 50 units  All injections were done after obtaining appropriate EMG activity and after negative drawback for blood. The patient tolerated the procedure well. Post procedure instructions were given. A followup appointment was made.   Post procedure instructions and followup visit were given.

## 2024-01-13 NOTE — Patient Instructions (Signed)
You received a Xeomin injection today. You may experience soreness at the needle injection sites. Please call us if any of the injection sites turns red after a couple days or if there is any drainage. You may experience muscle weakness as a result of Xeomin This would improve with time but can take several weeks to improve. The Xeomin should start working in about one week. The Xeomin usually last 3 months. The injection can be repeated every 3 months as needed.

## 2024-01-17 ENCOUNTER — Other Ambulatory Visit (HOSPITAL_BASED_OUTPATIENT_CLINIC_OR_DEPARTMENT_OTHER): Payer: Self-pay

## 2024-01-17 DIAGNOSIS — R632 Polyphagia: Secondary | ICD-10-CM | POA: Diagnosis not present

## 2024-01-17 DIAGNOSIS — K760 Fatty (change of) liver, not elsewhere classified: Secondary | ICD-10-CM | POA: Diagnosis not present

## 2024-01-17 DIAGNOSIS — I10 Essential (primary) hypertension: Secondary | ICD-10-CM | POA: Diagnosis not present

## 2024-01-17 DIAGNOSIS — E6609 Other obesity due to excess calories: Secondary | ICD-10-CM | POA: Diagnosis not present

## 2024-01-17 MED ORDER — WEGOVY 0.5 MG/0.5ML ~~LOC~~ SOAJ
0.5000 mg | SUBCUTANEOUS | 1 refills | Status: DC
  Filled 2024-01-17: qty 2, 28d supply, fill #0

## 2024-01-28 ENCOUNTER — Other Ambulatory Visit (HOSPITAL_BASED_OUTPATIENT_CLINIC_OR_DEPARTMENT_OTHER): Payer: Self-pay

## 2024-01-31 ENCOUNTER — Other Ambulatory Visit (HOSPITAL_BASED_OUTPATIENT_CLINIC_OR_DEPARTMENT_OTHER): Payer: Self-pay

## 2024-01-31 ENCOUNTER — Other Ambulatory Visit: Payer: Self-pay

## 2024-02-07 ENCOUNTER — Ambulatory Visit: Payer: BC Managed Care – PPO | Admitting: Internal Medicine

## 2024-02-14 ENCOUNTER — Other Ambulatory Visit (HOSPITAL_BASED_OUTPATIENT_CLINIC_OR_DEPARTMENT_OTHER): Payer: Self-pay

## 2024-02-14 DIAGNOSIS — Z8639 Personal history of other endocrine, nutritional and metabolic disease: Secondary | ICD-10-CM | POA: Diagnosis not present

## 2024-02-14 DIAGNOSIS — Z8673 Personal history of transient ischemic attack (TIA), and cerebral infarction without residual deficits: Secondary | ICD-10-CM | POA: Diagnosis not present

## 2024-02-14 DIAGNOSIS — K760 Fatty (change of) liver, not elsewhere classified: Secondary | ICD-10-CM | POA: Diagnosis not present

## 2024-02-14 DIAGNOSIS — I10 Essential (primary) hypertension: Secondary | ICD-10-CM | POA: Diagnosis not present

## 2024-02-14 MED ORDER — ZEPBOUND 2.5 MG/0.5ML ~~LOC~~ SOAJ
2.5000 mg | SUBCUTANEOUS | 0 refills | Status: DC
  Filled 2024-02-14 – 2024-02-18 (×2): qty 2, 28d supply, fill #0

## 2024-02-16 ENCOUNTER — Other Ambulatory Visit (HOSPITAL_BASED_OUTPATIENT_CLINIC_OR_DEPARTMENT_OTHER): Payer: Self-pay

## 2024-02-16 ENCOUNTER — Other Ambulatory Visit: Payer: Self-pay | Admitting: Internal Medicine

## 2024-02-16 ENCOUNTER — Other Ambulatory Visit: Payer: Self-pay

## 2024-02-16 MED ORDER — TRAZODONE HCL 100 MG PO TABS
100.0000 mg | ORAL_TABLET | Freq: Every evening | ORAL | 1 refills | Status: DC | PRN
  Filled 2024-02-16: qty 90, 90d supply, fill #0
  Filled 2024-02-18: qty 30, 30d supply, fill #0
  Filled 2024-04-07: qty 30, 30d supply, fill #1
  Filled 2024-05-10: qty 30, 30d supply, fill #2
  Filled 2024-06-20: qty 30, 30d supply, fill #3
  Filled 2024-08-16: qty 30, 30d supply, fill #4
  Filled 2024-11-29: qty 30, 30d supply, fill #5

## 2024-02-16 NOTE — Telephone Encounter (Signed)
 Please advise? I'm unable to see the PDMP to see who has been prescribing this.

## 2024-02-17 ENCOUNTER — Other Ambulatory Visit (HOSPITAL_BASED_OUTPATIENT_CLINIC_OR_DEPARTMENT_OTHER): Payer: Self-pay

## 2024-02-17 NOTE — Telephone Encounter (Signed)
 I have not prescribed tramadol in many years, she is due for office visit, we could address pain mngmt at the time

## 2024-02-18 ENCOUNTER — Other Ambulatory Visit: Payer: Self-pay

## 2024-02-18 ENCOUNTER — Other Ambulatory Visit (HOSPITAL_BASED_OUTPATIENT_CLINIC_OR_DEPARTMENT_OTHER): Payer: Self-pay

## 2024-02-18 NOTE — Telephone Encounter (Signed)
Pt has appt on 02/21/24

## 2024-02-21 ENCOUNTER — Other Ambulatory Visit (HOSPITAL_BASED_OUTPATIENT_CLINIC_OR_DEPARTMENT_OTHER): Payer: Self-pay

## 2024-02-21 ENCOUNTER — Encounter: Payer: Self-pay | Admitting: Internal Medicine

## 2024-02-21 ENCOUNTER — Ambulatory Visit (INDEPENDENT_AMBULATORY_CARE_PROVIDER_SITE_OTHER): Payer: BC Managed Care – PPO | Admitting: Internal Medicine

## 2024-02-21 VITALS — BP 134/60 | HR 76 | Temp 98.3°F | Resp 16 | Ht <= 58 in | Wt 142.1 lb

## 2024-02-21 DIAGNOSIS — E785 Hyperlipidemia, unspecified: Secondary | ICD-10-CM | POA: Diagnosis not present

## 2024-02-21 DIAGNOSIS — D649 Anemia, unspecified: Secondary | ICD-10-CM | POA: Diagnosis not present

## 2024-02-21 DIAGNOSIS — R739 Hyperglycemia, unspecified: Secondary | ICD-10-CM | POA: Diagnosis not present

## 2024-02-21 DIAGNOSIS — R1013 Epigastric pain: Secondary | ICD-10-CM | POA: Diagnosis not present

## 2024-02-21 DIAGNOSIS — I10 Essential (primary) hypertension: Secondary | ICD-10-CM | POA: Diagnosis not present

## 2024-02-21 LAB — COMPREHENSIVE METABOLIC PANEL
ALT: 26 U/L (ref 0–35)
AST: 22 U/L (ref 0–37)
Albumin: 3.9 g/dL (ref 3.5–5.2)
Alkaline Phosphatase: 83 U/L (ref 39–117)
BUN: 10 mg/dL (ref 6–23)
CO2: 33 meq/L — ABNORMAL HIGH (ref 19–32)
Calcium: 9.6 mg/dL (ref 8.4–10.5)
Chloride: 102 meq/L (ref 96–112)
Creatinine, Ser: 0.92 mg/dL (ref 0.40–1.20)
GFR: 72.71 mL/min (ref >60.00)
Glucose, Bld: 101 mg/dL — ABNORMAL HIGH (ref 70–99)
Potassium: 3.8 meq/L (ref 3.5–5.1)
Sodium: 140 meq/L (ref 135–145)
Total Bilirubin: 0.4 mg/dL (ref 0.2–1.2)
Total Protein: 6.9 g/dL (ref 6.0–8.3)

## 2024-02-21 LAB — CBC WITH DIFFERENTIAL/PLATELET
Basophils Absolute: 0 10*3/uL (ref 0.0–0.1)
Basophils Relative: 0.2 % (ref 0.0–3.0)
Eosinophils Absolute: 0 10*3/uL (ref 0.0–0.7)
Eosinophils Relative: 0.7 % (ref 0.0–5.0)
HCT: 38.8 % (ref 36.0–46.0)
Hemoglobin: 12.5 g/dL (ref 12.0–15.0)
Lymphocytes Relative: 33.6 % (ref 12.0–46.0)
Lymphs Abs: 1.3 10*3/uL (ref 0.7–4.0)
MCHC: 32.2 g/dL (ref 30.0–36.0)
MCV: 94.7 fl (ref 78.0–100.0)
Monocytes Absolute: 0.4 10*3/uL (ref 0.1–1.0)
Monocytes Relative: 10.4 % (ref 3.0–12.0)
Neutro Abs: 2.1 10*3/uL (ref 1.4–7.7)
Neutrophils Relative %: 55.1 % (ref 43.0–77.0)
Platelets: 179 10*3/uL (ref 150.0–400.0)
RBC: 4.09 Mil/uL (ref 3.87–5.11)
RDW: 14.5 % (ref 11.5–15.5)
WBC: 3.8 10*3/uL — ABNORMAL LOW (ref 4.0–10.5)

## 2024-02-21 LAB — IBC + FERRITIN
Ferritin: 33.1 ng/mL (ref 10.0–291.0)
Iron: 87 ug/dL (ref 42–145)
Saturation Ratios: 28.5 % (ref 20.0–50.0)
TIBC: 305.2 ug/dL (ref 250.0–450.0)
Transferrin: 218 mg/dL (ref 212.0–360.0)

## 2024-02-21 LAB — LIPID PANEL
Cholesterol: 121 mg/dL (ref 0–200)
HDL: 55.8 mg/dL (ref >39.00)
LDL Cholesterol: 57 mg/dL (ref 0–99)
NonHDL: 65.47
Total CHOL/HDL Ratio: 2
Triglycerides: 42 mg/dL (ref 0.0–149.0)
VLDL: 8.4 mg/dL (ref 0.0–40.0)

## 2024-02-21 LAB — HEMOGLOBIN A1C: Hgb A1c MFr Bld: 5.6 % (ref 4.6–6.5)

## 2024-02-21 MED ORDER — BOOSTRIX 5-2.5-18.5 LF-MCG/0.5 IM SUSY
0.5000 mL | PREFILLED_SYRINGE | Freq: Once | INTRAMUSCULAR | 0 refills | Status: AC
  Filled 2024-02-21: qty 0.5, 1d supply, fill #0

## 2024-02-21 MED ORDER — SHINGRIX 50 MCG/0.5ML IM SUSR
0.5000 mL | Freq: Once | INTRAMUSCULAR | 1 refills | Status: AC
  Filled 2024-02-21: qty 0.5, 1d supply, fill #0

## 2024-02-21 NOTE — Assessment & Plan Note (Signed)
 Preventive care discussed.:  - Tdap 2015 -prevnar 12/2016;  PNM 23: 2019 - Had a recent flu shot -Vaccines I recommended:  Tdap COVID-vaccine if not done in the last 6 months Shingrix Last [facility] 02/08/2023 Next colonoscopy 09/2024

## 2024-02-21 NOTE — Patient Instructions (Signed)
 Vaccines are recommended, you can get it at the pharmacy. Tetanus shot (Tdap) Shingles shot  (Shingrix) A COVID-vaccine if not done in the last 6 months A flu shot every fall    GO TO THE LAB : Get the blood work     Please go to the front desk: Arrange for a follow-up in 6 months       If you have MyChart, please check frequently in the next few days for your results

## 2024-02-21 NOTE — Assessment & Plan Note (Signed)
 Diabetes: Check A1c. Obesity: Follow-up by Dr. Earlene Plater, recently switched from [facility] to Zepbound last week. Dyspepsia: Described as abdominal bloating, possibly a side effect from [facility] which was recently changed.  No red flag symptoms, checking general labs.  Otherwise observation. Hyperlipidemia: On atorvastatin, checking labs. HTN: Seems controlled, continue amlodipine, carvedilol, losartan, check CMP. Anemia: Mild, per chart review, checking a CBC and iron panel Preventive care discussed  RTC 6 months

## 2024-02-21 NOTE — Progress Notes (Signed)
 Subjective:    Patient ID: Abigail Campos, female    DOB: [birth year], 53 y.o.   MRN: [id number]  DOS:  02/21/2024 Type of visit - description: Routine follow-up  Likes to discuss vaccinations. Also, she has been feeling abdominal bloating for several months. Occasional nausea but no vomiting. Denies diarrhea, constipation or blood in the stools. No heartburn.   Review of Systems See above   Past Medical History:  Diagnosis Date   Allergy    Anemia    Anxiety and depression    Arthritis    Back pain    Blood transfusion without reported diagnosis    Essential hypertension 10/19/2007   03-2010: metoprolol changed to bystolic (was not feeling well on it, no specific allergy or reaction)     Food allergy    Hemiplegia (HCC)    Hyperlipemia    Hypertension    Joint pain    Migraine    Neuromuscular disorder (HCC)    left sided hemiplegia - has brace for l leg and walks with a cane   Obesity    Ovarian cancer (HCC)    Prediabetes    Sleep apnea    not wearing c-pap yet   Sleep apnea    Stroke (HCC) 2017   Vitamin D deficiency     Past Surgical History:  Procedure Laterality Date   ABDOMINAL HYSTERECTOMY  11/14/2007   no oophorectomy per surgical report   BREAST BIOPSY Right 02/08/2023   MM RT BREAST BX W LOC DEV 1ST LESION IMAGE BX SPEC STEREO GUIDE 02/08/2023 [facility]   CESAREAN SECTION     [id number]   HERNIA REPAIR     INGUINAL HERNIA REPAIR     2004    Current Outpatient Medications  Medication Instructions   alendronate (FOSAMAX) 70 MG tablet Take 1 tablet (70 mg total) by mouth once a week with a full glass of water on an empty stomach.   amLODipine (NORVASC) 5 mg, Oral, Daily   atorvastatin (LIPITOR) 20 mg, Oral, Daily   baclofen (LIORESAL) 10 mg, Oral, Daily   buPROPion (WELLBUTRIN XL) 150 mg, Oral, Daily   carvedilol (COREG) 25 mg, Oral, 2 times daily with meals   clopidogrel (PLAVIX) 75 mg, Oral, Daily   escitalopram (LEXAPRO) 20 mg,  Oral, Daily   fluconazole (DIFLUCAN) 150 mg, Oral, Daily, MAY REPEAT IN 3 DAYS AS NEEDED   losartan (COZAAR) 100 mg, Oral, Daily   naltrexone (DEPADE) 50 MG tablet Take by mouth.   naltrexone (DEPADE) 25 mg, Oral, Daily   prochlorperazine (COMPAZINE) 10 MG tablet Take by mouth.   Semaglutide-Weight Management 2.4 mg, Subcutaneous, Weekly   Semaglutide-Weight Management 0.5 mg, Subcutaneous, Weekly   tiZANidine (ZANAFLEX) 4 mg, Oral, 3 times daily   traMADol (ULTRAM) 50 mg, 3 times daily PRN   traZODone (DESYREL) 100 mg, Oral, At bedtime PRN   Vitamin D3 50,000 Units, Oral, Weekly   Wegovy 0.5 mg, Subcutaneous, Weekly   Zepbound 2.5 mg, Subcutaneous, Weekly       Objective:   Physical Exam BP 134/60   Pulse 76   Temp 98.3 F (36.8 C) (Oral)   Resp 16   Ht 4\' 10"  (1.473 m)   Wt 142 lb 2 oz (64.5 kg)   SpO2 97%   BMI 29.70 kg/m  General:   Well developed, NAD, BMI noted. HEENT:  Normocephalic . Face symmetric, atraumatic Lungs:  CTA B Normal respiratory effort, no intercostal retractions, no accessory muscle use. Heart:  RRR,  no murmur. Abdomen: Soft, nontender Lower extremities: no pretibial edema bilaterally  Skin: Not pale. Not jaundice Neurologic:  alert & oriented X3.  Speech normal, gait at baseline with left-sided paresis  psych--  Cognition and judgment appear intact.  Cooperative with normal attention span and concentration.  Behavior appropriate. No anxious or depressed appearing.      Assessment      Assessment   Prediabetes: A1c 6.0 (April 2017)  HTN-- HCTZ increased creatinine (07-2023) Hyperlipidemia Depression, insomnia, anxiety GERD Stroke 03-2016: [facility] (L)  spastic hemiplegia   ICH, right basilar ganglia due to HTN emergency, + encephalopathy, + dysphagia, aphasia, dysarthria Residual L spasticity.  CTA neck 03-2016 neg CTA head 10-17 (-) aneurysm Headaches , migraines dx after 2nd pregnancy, (-) CT head 2014 and 2015 OSA: Sleep study  08-2019, saw Dr. Maple Hudson, intolerant to CPAP  Osteoporosis.  T-score 04/2023 -3.2.  Rx Fosamax Vitamin D deficiency FH CAD: Myoview 09/2023 (-)  PLAN Diabetes: Check A1c. Obesity: Follow-up by Dr. Earlene Plater, recently switched from [facility] to Zepbound last week. Dyspepsia: Described as abdominal bloating, possibly a side effect from [facility] which was recently changed.  No red flag symptoms, checking general labs.  Otherwise observation. Hyperlipidemia: On atorvastatin, checking labs. HTN: Seems controlled, continue amlodipine, carvedilol, losartan, check CMP. Anemia: Mild, per chart review, checking a CBC and iron panel Preventive care discussed.:  - Tdap 2015 -prevnar 12/2016;  PNM 23: 2019 - Had a recent flu shot -Vaccines I recommended:  Tdap COVID-vaccine if not done in the last 6 months Shingrix Last MMG 02/08/2023 Next colonoscopy 09/2024 RTC 6 months

## 2024-02-23 ENCOUNTER — Emergency Department (HOSPITAL_BASED_OUTPATIENT_CLINIC_OR_DEPARTMENT_OTHER)
Admission: EM | Admit: 2024-02-23 | Discharge: 2024-02-23 | Disposition: A | Discharge status: Home / Self Care | Payer: PRIVATE HEALTH INSURANCE | Attending: Emergency Medicine | Admitting: Emergency Medicine

## 2024-02-23 ENCOUNTER — Other Ambulatory Visit: Payer: Self-pay

## 2024-02-23 ENCOUNTER — Encounter (HOSPITAL_BASED_OUTPATIENT_CLINIC_OR_DEPARTMENT_OTHER): Payer: Self-pay | Admitting: Emergency Medicine

## 2024-02-23 ENCOUNTER — Encounter: Payer: Self-pay | Admitting: Internal Medicine

## 2024-02-23 DIAGNOSIS — Z91040 Latex allergy status: Secondary | ICD-10-CM | POA: Diagnosis not present

## 2024-02-23 DIAGNOSIS — Z8543 Personal history of malignant neoplasm of ovary: Secondary | ICD-10-CM | POA: Diagnosis not present

## 2024-02-23 DIAGNOSIS — R7303 Prediabetes: Secondary | ICD-10-CM | POA: Diagnosis not present

## 2024-02-23 DIAGNOSIS — I10 Essential (primary) hypertension: Secondary | ICD-10-CM | POA: Diagnosis not present

## 2024-02-23 DIAGNOSIS — Z79899 Other long term (current) drug therapy: Secondary | ICD-10-CM | POA: Diagnosis not present

## 2024-02-23 DIAGNOSIS — Z91010 Allergy to peanuts: Secondary | ICD-10-CM | POA: Insufficient documentation

## 2024-02-23 DIAGNOSIS — R509 Fever, unspecified: Secondary | ICD-10-CM | POA: Diagnosis not present

## 2024-02-23 DIAGNOSIS — R5083 Postvaccination fever: Secondary | ICD-10-CM | POA: Diagnosis not present

## 2024-02-23 LAB — RESP PANEL BY RT-PCR (RSV, FLU A&B, COVID)  RVPGX2
Influenza A by PCR: NEGATIVE
Influenza B by PCR: NEGATIVE
Resp Syncytial Virus by PCR: NEGATIVE
SARS Coronavirus 2 by RT PCR: NEGATIVE

## 2024-02-23 MED ORDER — ACETAMINOPHEN 325 MG PO TABS
650.0000 mg | ORAL_TABLET | Freq: Once | ORAL | Status: AC
Start: 2024-02-23 — End: 2024-02-23
  Administered 2024-02-23: 650 mg via ORAL
  Filled 2024-02-23: qty 2

## 2024-02-23 NOTE — ED Provider Notes (Signed)
 [place] [facility] Provider Note   [name]: [id number] Arrival date & time: 02/23/24  1614     History {Add pertinent medical, surgical, social history, OB history to HPI:1} Chief Complaint  Patient presents with   Fever    Abigail Campos is a 53 y.o. female with PMH as listed below who presents with fever since Monday after receiving Dtap and Shingles vaccines   Past Medical History:  Diagnosis Date   Allergy    Anemia    Anxiety and depression    Arthritis    Back pain    Blood transfusion without reported diagnosis    Essential hypertension 10/19/2007   03-2010: metoprolol changed to bystolic (was not feeling well on it, no specific allergy or reaction)     Food allergy    Hemiplegia (HCC)    Hyperlipemia    Hypertension    Joint pain    Migraine    Neuromuscular disorder (HCC)    left sided hemiplegia - has brace for l leg and walks with a cane   Obesity    Ovarian cancer (HCC)    Prediabetes    Sleep apnea    not wearing c-pap yet   Sleep apnea    Stroke (HCC) 2017   Vitamin D deficiency        Home Medications Prior to Admission medications   Medication Sig Start Date End Date Taking? Authorizing Provider  alendronate (FOSAMAX) 70 MG tablet Take 1 tablet (70 mg total) by mouth once a week with a full glass of water on an empty stomach. 05/06/23     amLODipine (NORVASC) 5 MG tablet Take 1 tablet (5 mg total) by mouth daily. 10/11/23   Wanda Plump, MD  atorvastatin (LIPITOR) 20 MG tablet Take 1 tablet (20 mg total) by mouth daily. 04/19/23   Wanda Plump, MD  baclofen (LIORESAL) 10 MG tablet Take 1 tablet (10 mg total) by mouth daily. 10/11/23   Kirsteins, Victorino Sparrow, MD  buPROPion (WELLBUTRIN XL) 150 MG 24 hr tablet Take 1 tablet (150 mg total) by mouth daily. 10/06/23   Wanda Plump, MD  carvedilol (COREG) 25 MG tablet Take 1 tablet (25 mg total) by mouth 2 (two) times daily with a meal. 10/18/23   Wanda Plump, MD   Cholecalciferol 1.25 MG (50000 UT) capsule Take 1 capsule (50,000 Units total) by mouth once a week. 07/13/23     clopidogrel (PLAVIX) 75 MG tablet Take 1 tablet (75 mg total) by mouth daily. 10/27/23   Micki Riley, MD  escitalopram (LEXAPRO) 20 MG tablet Take 1 tablet (20 mg total) by mouth daily. 09/10/23   Wanda Plump, MD  losartan (COZAAR) 100 MG tablet Take 1 tablet (100 mg total) by mouth daily. 11/16/23   Wanda Plump, MD  prochlorperazine (COMPAZINE) 10 MG tablet Take by mouth. 02/28/23   [provider]  tirzepatide (ZEPBOUND) 2.5 MG/0.5ML Pen Inject 2.5 mg into the skin once a week. 02/14/24     tiZANidine (ZANAFLEX) 2 MG tablet Take 2 tablets (4 mg total) by mouth 3 (three) times daily. 09/23/23   Kirsteins, Victorino Sparrow, MD  traMADol (ULTRAM) 50 MG tablet Take 50 mg by mouth 3 (three) times daily as needed. 03/26/23   [provider]  traZODone (DESYREL) 100 MG tablet Take 1 tablet (100 mg total) by mouth at bedtime as needed for sleep. 02/16/24   Wanda Plump, MD  Allergies    Bee venom, Iopamidol, Peanut-containing drug products, Aspirin, Latex, Zolpidem tartrate, and Hydrocodone    Review of Systems   Review of Systems A 10 point review of systems was performed and is negative unless otherwise reported in HPI.  Physical Exam Updated Vital Signs BP 127/84   Pulse 91   Temp 98.7 F (37.1 C) (Oral)   Resp 18   Ht 4\' 10"  (1.473 m)   Wt 64.5 kg   SpO2 97%   BMI 29.72 kg/m  Physical Exam General: Normal appearing {Desc; female/female:11659}, lying in bed.  HEENT: PERRLA, Sclera anicteric, MMM, trachea midline.  Cardiology: RRR, no murmurs/rubs/gallops. BL radial and DP pulses equal bilaterally.  Resp: Normal respiratory rate and effort. CTAB, no wheezes, rhonchi, crackles.  Abd: Soft, non-tender, non-distended. No rebound tenderness or guarding.  GU: Deferred. MSK: No peripheral edema or signs of trauma. Extremities without deformity or TTP. No cyanosis or  clubbing. Skin: warm, dry. No rashes or lesions. Back: No CVA tenderness Neuro: A&Ox4, CNs II-XII grossly intact. MAEs. Sensation grossly intact.  Psych: Normal mood and affect.   ED Results / Procedures / Treatments   Labs (all labs ordered are listed, but only abnormal results are displayed) Labs Reviewed  RESP PANEL BY RT-PCR (RSV, FLU A&B, COVID)  [id number]    EKG None  Radiology No results found.  Procedures Procedures  {Document cardiac monitor, telemetry assessment procedure when appropriate:1}  Medications Ordered in ED Medications  acetaminophen (TYLENOL) tablet 650 mg (650 mg Oral Given 02/23/24 1629)    ED Course/ Medical Decision Making/ A&P                          Medical Decision Making Risk OTC drugs.    This patient presents to the ED for concern of ***, this involves an extensive number of treatment options, and is a complaint that carries with it a high risk of complications and morbidity.  I considered the following differential and admission for this acute, potentially life threatening condition.   MDM:    ***     Labs: I Ordered, and personally interpreted labs.  The pertinent results include:  ***  Imaging Studies ordered: I ordered imaging studies including *** I independently visualized and interpreted imaging. I agree with the radiologist interpretation  Additional history obtained from ***.  External records from outside source obtained and reviewed including ***  Cardiac Monitoring: The patient was maintained on a cardiac monitor.  I personally viewed and interpreted the cardiac monitored which showed an underlying rhythm of: ***  Reevaluation: After the interventions noted above, I reevaluated the patient and found that they have :{resolved/improved/worsened:23923::"improved"}  Social Determinants of Health: ***  Disposition:  ***  Co morbidities that complicate the patient evaluation  Past Medical History:  Diagnosis Date    Allergy    Anemia    Anxiety and depression    Arthritis    Back pain    Blood transfusion without reported diagnosis    Essential hypertension 10/19/2007   03-2010: metoprolol changed to bystolic (was not feeling well on it, no specific allergy or reaction)     Food allergy    Hemiplegia (HCC)    Hyperlipemia    Hypertension    Joint pain    Migraine    Neuromuscular disorder (HCC)    left sided hemiplegia - has brace for l leg and walks with a cane   Obesity  Ovarian cancer (HCC)    Prediabetes    Sleep apnea    not wearing c-pap yet   Sleep apnea    Stroke [facility]) 2017   Vitamin D deficiency      Medicines Meds ordered this encounter  Medications   acetaminophen (TYLENOL) tablet 650 mg    I have reviewed the patients home medicines and have made adjustments as needed  Problem List / ED Course: Problem List Items Addressed This Visit   None        {Document critical care time when appropriate:1} {Document review of labs and clinical decision tools ie heart score, Chads2Vasc2 etc:1}  {Document your independent review of radiology images, and any outside records:1} {Document your discussion with family members, caretakers, and with consultants:1} {Document social determinants of health affecting pt's care:1} {Document your decision making why or why not admission, treatments were needed:1}  This note was created using dictation software, which may contain spelling or grammatical errors.

## 2024-02-23 NOTE — Discharge Instructions (Signed)
 Thank you for coming to [facility]. You were seen for fever, fatigue. We did an exam, labs, and these showed no acute findings.  Likely her fever and fatigue were a side effect after the Shingrix vaccine. Please follow up with your primary care provider within 1 week.   Do not hesitate to return to the ED or call 911 if you experience: -Worsening symptoms -Pain with urinating or blood in your urine, flank pain -Shortness of breath or chest pain -Abdominal pain -Lightheadedness, passing out -Fevers/chills -Anything else that concerns you

## 2024-02-23 NOTE — ED Triage Notes (Signed)
 Pt reports fever since Monday after receiving Dtap and Shingles vaccines

## 2024-02-23 NOTE — ED Notes (Addendum)
 Pt reports fever since Monday after receiving Dtap and Shingles vaccines No fever at this time after tylenol given Swabs neg

## 2024-03-20 ENCOUNTER — Other Ambulatory Visit: Payer: Self-pay

## 2024-03-20 ENCOUNTER — Other Ambulatory Visit (HOSPITAL_BASED_OUTPATIENT_CLINIC_OR_DEPARTMENT_OTHER): Payer: Self-pay

## 2024-03-20 MED ORDER — ZEPBOUND 5 MG/0.5ML ~~LOC~~ SOAJ
5.0000 mg | SUBCUTANEOUS | 0 refills | Status: DC
Start: 2024-03-20 — End: 2024-04-24
  Filled 2024-03-20 – 2024-03-21 (×2): qty 2, 28d supply, fill #0

## 2024-03-20 MED ORDER — ZEPBOUND 2.5 MG/0.5ML ~~LOC~~ SOAJ
2.5000 mg | SUBCUTANEOUS | 0 refills | Status: DC
Start: 2024-03-20 — End: 2024-04-13
  Filled 2024-03-20: qty 2, 28d supply, fill #0

## 2024-03-20 MED FILL — Atorvastatin Calcium Tab 20 MG (Base Equivalent): ORAL | 90 days supply | Qty: 90 | Fill #1 | Status: AC

## 2024-03-20 MED FILL — Escitalopram Oxalate Tab 20 MG (Base Equiv): ORAL | 90 days supply | Qty: 90 | Fill #1 | Status: AC

## 2024-03-21 ENCOUNTER — Encounter: Payer: Self-pay | Admitting: Physical Medicine & Rehabilitation

## 2024-03-21 ENCOUNTER — Other Ambulatory Visit (HOSPITAL_BASED_OUTPATIENT_CLINIC_OR_DEPARTMENT_OTHER): Payer: Self-pay

## 2024-03-27 ENCOUNTER — Encounter: Payer: Self-pay | Admitting: Internal Medicine

## 2024-03-27 ENCOUNTER — Other Ambulatory Visit: Payer: Self-pay | Admitting: Internal Medicine

## 2024-03-27 DIAGNOSIS — Z1231 Encounter for screening mammogram for malignant neoplasm of breast: Secondary | ICD-10-CM

## 2024-03-27 DIAGNOSIS — K5900 Constipation, unspecified: Secondary | ICD-10-CM

## 2024-03-30 ENCOUNTER — Emergency Department (HOSPITAL_BASED_OUTPATIENT_CLINIC_OR_DEPARTMENT_OTHER)

## 2024-03-30 ENCOUNTER — Encounter: Payer: Self-pay | Admitting: Internal Medicine

## 2024-03-30 ENCOUNTER — Ambulatory Visit: Admission: EM | Admit: 2024-03-30 | Discharge: 2024-03-30 | Disposition: A | Discharge status: Home / Self Care

## 2024-03-30 ENCOUNTER — Emergency Department (HOSPITAL_BASED_OUTPATIENT_CLINIC_OR_DEPARTMENT_OTHER): Admitting: Radiology

## 2024-03-30 ENCOUNTER — Emergency Department (HOSPITAL_BASED_OUTPATIENT_CLINIC_OR_DEPARTMENT_OTHER)
Admission: EM | Admit: 2024-03-30 | Discharge: 2024-03-30 | Disposition: A | Discharge status: Home / Self Care | Attending: Emergency Medicine | Admitting: Emergency Medicine

## 2024-03-30 ENCOUNTER — Ambulatory Visit
Admission: RE | Admit: 2024-03-30 | Discharge: 2024-03-30 | Discharge status: Home / Self Care | Source: Ambulatory Visit | Attending: Internal Medicine | Admitting: Internal Medicine

## 2024-03-30 ENCOUNTER — Other Ambulatory Visit: Payer: Self-pay

## 2024-03-30 DIAGNOSIS — Z8543 Personal history of malignant neoplasm of ovary: Secondary | ICD-10-CM | POA: Insufficient documentation

## 2024-03-30 DIAGNOSIS — R0781 Pleurodynia: Secondary | ICD-10-CM | POA: Insufficient documentation

## 2024-03-30 DIAGNOSIS — I10 Essential (primary) hypertension: Secondary | ICD-10-CM | POA: Insufficient documentation

## 2024-03-30 DIAGNOSIS — Z7902 Long term (current) use of antithrombotics/antiplatelets: Secondary | ICD-10-CM | POA: Diagnosis not present

## 2024-03-30 DIAGNOSIS — Z91040 Latex allergy status: Secondary | ICD-10-CM | POA: Insufficient documentation

## 2024-03-30 DIAGNOSIS — Z79899 Other long term (current) drug therapy: Secondary | ICD-10-CM | POA: Insufficient documentation

## 2024-03-30 DIAGNOSIS — R55 Syncope and collapse: Secondary | ICD-10-CM | POA: Insufficient documentation

## 2024-03-30 DIAGNOSIS — Z1231 Encounter for screening mammogram for malignant neoplasm of breast: Secondary | ICD-10-CM

## 2024-03-30 DIAGNOSIS — Z8673 Personal history of transient ischemic attack (TIA), and cerebral infarction without residual deficits: Secondary | ICD-10-CM | POA: Insufficient documentation

## 2024-03-30 LAB — BASIC METABOLIC PANEL WITH GFR
Anion gap: 6 (ref 5–15)
BUN: 14 mg/dL (ref 6–20)
CO2: 26 mmol/L (ref 22–32)
Calcium: 10 mg/dL (ref 8.9–10.3)
Chloride: 104 mmol/L (ref 98–111)
Creatinine, Ser: 0.93 mg/dL (ref 0.44–1.00)
GFR, Estimated: >60 mL/min (ref >60)
Glucose, Bld: 83 mg/dL (ref 70–99)
Potassium: 4 mmol/L (ref 3.5–5.1)
Sodium: 136 mmol/L (ref 135–145)

## 2024-03-30 LAB — CBC
HCT: 40.9 % (ref 36.0–46.0)
Hemoglobin: 13.1 g/dL (ref 12.0–15.0)
MCH: 30 pg (ref 26.0–34.0)
MCHC: 32 g/dL (ref 30.0–36.0)
MCV: 93.6 fL (ref 80.0–100.0)
Platelets: 181 10*3/uL (ref 150–400)
RBC: 4.37 MIL/uL (ref 3.87–5.11)
RDW: 13.7 % (ref 11.5–15.5)
WBC: 4.6 10*3/uL (ref 4.0–10.5)
nRBC: 0 % (ref 0.0–0.2)

## 2024-03-30 MED ORDER — ACETAMINOPHEN 325 MG PO TABS
650.0000 mg | ORAL_TABLET | Freq: Once | ORAL | Status: AC
  Administered 2024-03-30: 650 mg via ORAL
  Filled 2024-03-30: qty 2

## 2024-03-30 MED ORDER — OXYCODONE HCL 5 MG PO TABS: 2.5000 mg | ORAL_TABLET | Freq: Four times a day (QID) | ORAL | 0 refills | Status: DC | PRN

## 2024-03-30 NOTE — ED Triage Notes (Addendum)
 Possible syncope after using restroom- fell forward and struck head on dresser drawer. Denies neck pain/tenderness. Endorses headache, left rib pain. Son called her several and times and patient was 'out of it' for a moment.   HX CVA 8 years ago- left weak

## 2024-03-30 NOTE — Discharge Instructions (Signed)
 As discussed, CT imaging of your head and neck appeared normal.  X-ray of your chest did not show any obvious rib fractures but there could be underlying rib fractures.  Recommend use of Tylenol for your baseline pain.  Will send in pain medication for any breakthrough pain.  You may also benefit from numbing patches such as over-the-counter Salonpas for treatment of your rib pain.  Continue use incentive spirometry to decrease likelihood of developing pneumonia.  Please do not hesitate to return to emergency department if the worrisome signs and symptoms we discussed become apparent.

## 2024-03-30 NOTE — ED Notes (Signed)
 Patient is being discharged from the Urgent Care and sent to the Emergency Department via POV with family . Per Aisha Ali, PA-C, patient is in need of higher level of care due to need for CT scan. Patient is aware and verbalizes understanding of plan of care.  Vitals:   03/30/24 1700  BP: 109/72  Pulse: 65  Resp: 18  Temp: 98.1 F (36.7 C)  SpO2: 98%

## 2024-03-30 NOTE — ED Provider Notes (Signed)
 [facility] Provider Note   [name]: [id number] Arrival date & time: 03/30/24  1758     History  Chief Complaint  Patient presents with   Fall (plavix )    Abigail Campos is a 53 y.o. female.  HPI   53 year old female presents to the emergency department after syncopal episode.  Patient states that she was on the toilet urinating.  States that she felt lightheaded and stood up.  Subsequently passed out falling forward.  States that she struck the front of her head on the wooden vanity.  States that she was only out a matter of seconds before her son came upstairs after hearing the fall.  Currently complaining of left-sided rib pain.  Reports history of stroke with left-sided weakness; baseline inability to move her left arm with weakness in her left leg.  Past medical history significant for CVA with hemiplegia, hypertension, neuromuscular disorder, ovarian cancer, OSA, POTS, migraine  Home Medications Prior to Admission medications   Medication Sig Start Date End Date Taking? Authorizing Provider  oxyCODONE  (ROXICODONE ) 5 MG immediate release tablet Take 0.5 tablets (2.5 mg total) by mouth every 6 (six) hours as needed for severe pain (pain score 7-10). 03/30/24  Yes Kerr Butter, PA  alendronate  (FOSAMAX ) 70 MG tablet Take 1 tablet (70 mg total) by mouth once a week with a full glass of water on an empty stomach. 05/06/23     amLODipine  (NORVASC ) 5 MG tablet Take 1 tablet (5 mg total) by mouth daily. 10/11/23   Ezell Hollow, MD  atorvastatin  (LIPITOR) 20 MG tablet Take 1 tablet (20 mg total) by mouth daily. 04/19/23   Ezell Hollow, MD  baclofen  (LIORESAL ) 10 MG tablet Take 1 tablet (10 mg total) by mouth daily. 10/11/23   Kirsteins, Cecilia Coe, MD  buPROPion  (WELLBUTRIN  XL) 150 MG 24 hr tablet Take 1 tablet (150 mg total) by mouth daily. 10/06/23   Ezell Hollow, MD  carvedilol  (COREG ) 25 MG tablet Take 1 tablet (25 mg total) by mouth 2 (two)  times daily with a meal. 10/18/23   Paz, Anitra Ket, MD  Cholecalciferol  1.25 MG (50000 UT) capsule Take 1 capsule (50,000 Units total) by mouth once a week. 07/13/23     clopidogrel  (PLAVIX ) 75 MG tablet Take 1 tablet (75 mg total) by mouth daily. 10/27/23   Lisabeth Rider, MD  escitalopram  (LEXAPRO ) 20 MG tablet Take 1 tablet (20 mg total) by mouth daily. 09/10/23   Ezell Hollow, MD  losartan  (COZAAR ) 100 MG tablet Take 1 tablet (100 mg total) by mouth daily. 11/16/23   Ezell Hollow, MD  prochlorperazine  (COMPAZINE ) 10 MG tablet Take by mouth. 02/28/23   [provider]  Semaglutide -Weight Management (WEGOVY ) 0.5 MG/0.5ML SOAJ 0.5 mL Subcutaneous weekly for 30 days    [provider]  tirzepatide  (ZEPBOUND ) 2.5 MG/0.5ML Pen Inject 2.5 mg into the skin once a week. 03/20/24     tirzepatide  (ZEPBOUND ) 5 MG/0.5ML Pen Inject 5 mg into the skin once a week. 03/20/24     tiZANidine  (ZANAFLEX ) 2 MG tablet Take 2 tablets (4 mg total) by mouth 3 (three) times daily. 09/23/23   Kirsteins, Cecilia Coe, MD  traZODone  (DESYREL ) 100 MG tablet Take 1 tablet (100 mg total) by mouth at bedtime as needed for sleep. 02/16/24   Paz, Jose E, MD      Allergies    Bee venom, Iopamidol , Peanut-containing drug products, Aspirin, Latex, Zolpidem   tartrate, and Hydrocodone     Review of Systems   Review of Systems  All other systems reviewed and are negative.   Physical Exam Updated Vital Signs BP (!) 116/91   Pulse 64   Temp 98 F (36.7 C) (Oral)   Resp 16   SpO2 99%  Physical Exam Vitals and nursing note reviewed.  Constitutional:      General: She is not in acute distress.    Appearance: She is well-developed.  HENT:     Head: Normocephalic and atraumatic.  Eyes:     Conjunctiva/sclera: Conjunctivae normal.  Cardiovascular:     Rate and Rhythm: Normal rate and regular rhythm.     Heart sounds: No murmur heard. Pulmonary:     Effort: Pulmonary effort is normal. No respiratory distress.     Breath  sounds: Normal breath sounds.  Abdominal:     Palpations: Abdomen is soft.     Tenderness: There is no abdominal tenderness. There is no guarding.  Musculoskeletal:        General: No swelling.     Cervical back: Neck supple.     Comments: No midline tenderness cervical, thoracic, lumbar spine without step-off or deformity.  Left anterior chest wall pain beneath breast.  No obvious palpable crepitus or flail chest.  No overlying tenderness of upper or lower extremities.  Skin:    General: Skin is warm and dry.     Capillary Refill: Capillary refill takes less than 2 seconds.  Neurological:     Mental Status: She is alert.     Comments: Alert and oriented to self, place, time and event.   Speech is fluent, clear without dysarthria or dysphasia.   Strength 5/5 in upper/lower extremities on the right.  Inability to move left arm.  Decree strength left leg. Sensation intact in upper/lower extremities   CN I not tested  CN II not tested CN III, IV, VI PERRLA and EOMs intact bilaterally  CN V Intact sensation to sharp and light touch to the face  CN VII facial movements symmetric  CN VIII not tested  CN IX, X no uvula deviation, symmetric rise of soft palate  CN XI 5/5 SCM and trapezius strength on the right.  Inability to move left. CN XII Midline tongue protrusion, symmetric L/R movements    Psychiatric:        Mood and Affect: Mood normal.     ED Results / Procedures / Treatments   Labs (all labs ordered are listed, but only abnormal results are displayed) Labs Reviewed  BASIC METABOLIC PANEL WITH GFR  CBC    EKG None  Radiology DG Ribs Unilateral W/Chest Left Result Date: 03/30/2024 CLINICAL DATA:  Fall with rib pain EXAM: LEFT RIBS AND CHEST - 3+ VIEW COMPARISON:  Chest x-ray 02/28/2023 FINDINGS: The heart and mediastinal contours are within normal limits. No focal consolidation. No pulmonary edema. No pleural effusion. No pneumothorax. BB marker overlies the left lower  chest wall. No acute displaced fracture or other bone lesions are seen involving the left ribs. Chronic left shoulder dislocation. IMPRESSION: 1. No acute displaced left rib fracture. Please note, nondisplaced rib fractures may be occult on radiograph. 2. No acute cardiopulmonary abnormality. 3. Chronic left shoulder dislocation. Electronically Signed   By: Morgane  Naveau M.D.   On: 03/30/2024 20:06   CT HEAD WO CONTRAST Result Date: 03/30/2024 CLINICAL DATA:  Head trauma, coagulopathy (Age 13-64y); Neck trauma, dangerous injury mechanism (Age 19-64y) EXAM: CT HEAD WITHOUT CONTRAST  CT CERVICAL SPINE WITHOUT CONTRAST TECHNIQUE: Multidetector CT imaging of the head and cervical spine was performed following the standard protocol without intravenous contrast. Multiplanar CT image reconstructions of the cervical spine were also generated. RADIATION DOSE REDUCTION: This exam was performed according to the departmental dose-optimization program which includes automated exposure control, adjustment of the mA and/or kV according to patient size and/or use of iterative reconstruction technique. COMPARISON:  CT head 02/28/2023 FINDINGS: CT HEAD FINDINGS Brain: Redemonstration of right frontoparietal encephalomalacia. No evidence of large-territorial acute infarction. No parenchymal hemorrhage. No mass lesion. No extra-axial collection. No mass effect or midline shift. No hydrocephalus. Basilar cisterns are patent. Vascular: No hyperdense vessel. Atherosclerotic calcifications are present within the cavernous internal carotid arteries. Skull: No acute fracture or focal lesion. Sinuses/Orbits: Paranasal sinuses and mastoid air cells are clear. The orbits are unremarkable. Other: None. CT CERVICAL SPINE FINDINGS Alignment: Normal. Skull base and vertebrae: No acute fracture. No aggressive appearing focal osseous lesion or focal pathologic process. Soft tissues and spinal canal: No prevertebral fluid or swelling. No visible  canal hematoma. Upper chest: Unremarkable. Other: None. IMPRESSION: 1. No acute intracranial abnormality. 2. No acute displaced fracture or traumatic listhesis of the cervical spine. Electronically Signed   By: Morgane  Naveau M.D.   On: 03/30/2024 19:55   CT Cervical Spine Wo Contrast Result Date: 03/30/2024 CLINICAL DATA:  Head trauma, coagulopathy (Age 2-64y); Neck trauma, dangerous injury mechanism (Age 83-64y) EXAM: CT HEAD WITHOUT CONTRAST CT CERVICAL SPINE WITHOUT CONTRAST TECHNIQUE: Multidetector CT imaging of the head and cervical spine was performed following the standard protocol without intravenous contrast. Multiplanar CT image reconstructions of the cervical spine were also generated. RADIATION DOSE REDUCTION: This exam was performed according to the departmental dose-optimization program which includes automated exposure control, adjustment of the mA and/or kV according to patient size and/or use of iterative reconstruction technique. COMPARISON:  CT head 02/28/2023 FINDINGS: CT HEAD FINDINGS Brain: Redemonstration of right frontoparietal encephalomalacia. No evidence of large-territorial acute infarction. No parenchymal hemorrhage. No mass lesion. No extra-axial collection. No mass effect or midline shift. No hydrocephalus. Basilar cisterns are patent. Vascular: No hyperdense vessel. Atherosclerotic calcifications are present within the cavernous internal carotid arteries. Skull: No acute fracture or focal lesion. Sinuses/Orbits: Paranasal sinuses and mastoid air cells are clear. The orbits are unremarkable. Other: None. CT CERVICAL SPINE FINDINGS Alignment: Normal. Skull base and vertebrae: No acute fracture. No aggressive appearing focal osseous lesion or focal pathologic process. Soft tissues and spinal canal: No prevertebral fluid or swelling. No visible canal hematoma. Upper chest: Unremarkable. Other: None. IMPRESSION: 1. No acute intracranial abnormality. 2. No acute displaced fracture or  traumatic listhesis of the cervical spine. Electronically Signed   By: Morgane  Naveau M.D.   On: 03/30/2024 19:55    Procedures Procedures    Medications Ordered in ED Medications  acetaminophen  (TYLENOL ) tablet 650 mg (650 mg Oral Given 03/30/24 1916)    ED Course/ Medical Decision Making/ A&P                                 Medical Decision Making Amount and/or Complexity of Data Reviewed Labs: ordered. Radiology: ordered.  Risk OTC drugs. Prescription drug management.   This patient presents to the ED for concern of syncope, this involves an extensive number of treatment options, and is a complaint that carries with it a high risk of complications and morbidity.  The differential diagnosis includes  vasovagal, orthostatic hypotension, seizure, medication side effect, arrhythmia, CVA, pneumothorax, fracture, strain/pain, dislocation, other   Co morbidities that complicate the patient evaluation  See HPI   Additional history obtained:  Additional history obtained from EMR External records from outside source obtained and reviewed including hospital records   Lab Tests:  I Ordered, and personally interpreted labs.  The pertinent results include:  No leukocytosis.  No evidence of anemia.  Platelets within range. No electrolyte abnormalities.  No renal dysfunction.   Imaging Studies ordered:  I ordered imaging studies including CT head/cervical spine, chest x-ray with left ribs I independently visualized and interpreted imaging which showed no acute intracranial abnormality.  No acute fracture or traumatic subluxation of the cervical spine.  No acute rib fracture or other acute cardiopulmonary abnormality. I agree with the radiologist interpretation   Cardiac Monitoring: / EKG:  The patient was maintained on a cardiac monitor.  I personally viewed and interpreted the cardiac monitored which showed an underlying rhythm of: Sinus rhythm   Consultations  Obtained:  N/a   Problem List / ED Course / Critical interventions / Medication management  Syncope, rib pain I ordered medication including Tylenol    Reevaluation of the patient after these medicines showed that the patient improved I have reviewed the patients home medicines and have made adjustments as needed   Social Determinants of Health:  Denies tobacco, licit drug use.   Test / Admission - Considered:  Syncope, rib pain Vitals signs within normal range and stable throughout visit. Laboratory/imaging studies significant for: See above 53 year old female presents to the emergency department after syncopal episode.  Patient states that she was on the toilet urinating.  States that she felt lightheaded and stood up.  Subsequently passed out falling forward.  States that she struck the front of her head on the wooden vanity.  States that she was only out a matter of seconds before her son came upstairs after hearing the fall.  Currently complaining of left-sided rib pain.  Reports history of stroke with left-sided weakness; baseline inability to move her left arm with weakness in her left leg. On exam, nonfocal neurologic exam from patient's baseline left-sided hemiplegia.  CT imaging of patient's head and cervical spine negative for any acute traumatic injury.  Chest x-ray without evidence of rib fracture or other acute cardiopulmonary abnormality.  Laboratory studies normal.  EKG/cardiac monitoring without significant arrhythmia.  Suspect most likely etiology of patient's syncopal episode either orthostatic hypotension versus vasovagal.  Will recommend adequate oral hydration, getting up slowly from a seated/lying down position.  Regarding rib pain, no evidence of fracture on chest x-ray imaging but will still recommend treatment of symptoms with OTC medication, incentive spirometry.  Treatment plan discussed with patient and she acknowledged and was agreeable to said plan.  Patient will  well-appearing, afebrile in no acute distress. Worrisome signs and symptoms were discussed with the patient, and the patient acknowledged understanding to return to the ED if noticed. Patient was stable upon discharge.          Final Clinical Impression(s) / ED Diagnoses Final diagnoses:  Syncope and collapse  Rib pain on left side    Rx / DC Orders      [place] Butter, [place] 03/30/24 2237    Nicklas Barns, MD 03/31/24 [phone]

## 2024-03-30 NOTE — ED Notes (Signed)
 Discharge paperwork given and verbally understood.

## 2024-03-30 NOTE — ED Provider Notes (Signed)
 [name]    [name]: [id number] Arrival date & time: 03/30/24  1614      History   Chief Complaint Chief Complaint  Patient presents with   Fall   Pain    HPI Abigail Campos is a 53 y.o. female.   Patient presents today for evaluation of possible fainting spell this morning.  She reports that she went to the bathroom felt dizzy and then fainted when she got up.  She states that she woke up with her son was calling her.  She notes that she has had some left-sided back pain and she did hit her head as well in the fall.  She denies any nausea or vomiting since.  She has not had any vision changes.  She does have history of left-sided hemiplegia at baseline.  The history is provided by the patient.  Fall Pertinent negatives include no shortness of breath.    Past Medical History:  Diagnosis Date   Allergy    Anemia    Anxiety and depression    Arthritis    Back pain    Blood transfusion without reported diagnosis    Essential hypertension 10/19/2007   03-2010: metoprolol changed to bystolic (was not feeling well on it, no specific allergy or reaction)     Food allergy    Hemiplegia (HCC)    Hyperlipemia    Hypertension    Joint pain    Migraine    Neuromuscular disorder (HCC)    left sided hemiplegia - has brace for l leg and walks with a cane   Obesity    Ovarian cancer (HCC)    Prediabetes    Sleep apnea    not wearing c-pap yet   Sleep apnea    Stroke (HCC) 2017   Vitamin D deficiency     Patient Active Problem List   Diagnosis Date Noted   Depression, major, single episode, severe (HCC) 07/23/2022   Vitamin D deficiency 07/06/2021   History of CVA (cerebrovascular accident) 07/06/2021   Other constipation 03/18/2021   Hot flashes 03/17/2021   Prediabetes 02/11/2021   Neuropathic pain 09/19/2020   Anxiety and depression 09/19/2020   Abnormality of gait 05/09/2020   Hemiplegia of nondominant side, late effect of cerebrovascular disease  (HCC) 03/28/2020   Spastic hemiplegia of left nondominant side due to nontraumatic intraparenchymal hemorrhage of brain (HCC) 12/26/2019   Trigger thumb of right hand 11/02/2019   OSA (obstructive sleep apnea) 07/26/2019   Spastic hemiplegia of left nondominant side as late effect of nontraumatic intraparenchymal hemorrhage of brain (HCC) 04/06/2019   Spastic hemiparesis affecting dominant side (HCC) 01/04/2018   Hyperglycemia 12/29/2016   Muscle spasticity    Hyperlipidemia 04/08/2016   Basal ganglia hemorrhage (HCC) 04/08/2016   Dysphagia as late effect of cerebrovascular disease    Migraine with aura and without status migrainosus, not intractable    Gait disturbance, post-stroke    Hemiplegia, post-stroke (HCC)    Aphasia, post-stroke    Dysarthria, post-stroke    ICH (intracerebral hemorrhage) (HCC) - R basal ganglia due to hypertensive emergency 04/01/2016   Upper airway cough syndrome 12/06/2015   PCP NOTES >>>>>>>>>>>>>>>>>>>>>>>>>>>.. 09/25/2015   Insomnia 12/28/2012   Annual physical exam 11/23/2011   Essential hypertension 10/19/2007    Past Surgical History:  Procedure Laterality Date   ABDOMINAL HYSTERECTOMY  11/14/2007   no oophorectomy per surgical report   BREAST BIOPSY Right 02/08/2023   MM RT BREAST BX W LOC DEV 1ST LESION  IMAGE BX SPEC STEREO GUIDE 02/08/2023 [facility]   CESAREAN SECTION     [id number],9604   HERNIA REPAIR     INGUINAL HERNIA REPAIR     2004    OB History     Gravida  2   Para  2   Term  2   Preterm      AB      Living  2      SAB      IAB      Ectopic      Multiple      Live Births  2            Home Medications    Prior to Admission medications   Medication Sig Start Date End Date Taking? Authorizing Provider  alendronate (FOSAMAX) 70 MG tablet Take 1 tablet (70 mg total) by mouth once a week with a full glass of water on an empty stomach. 05/06/23  Yes   amLODipine (NORVASC) 5 MG tablet Take 1 tablet (5  mg total) by mouth daily. 10/11/23  Yes Paz, Nolon Rod, MD  buPROPion (WELLBUTRIN XL) 150 MG 24 hr tablet Take 1 tablet (150 mg total) by mouth daily. 10/06/23  Yes Paz, Nolon Rod, MD  carvedilol (COREG) 25 MG tablet Take 1 tablet (25 mg total) by mouth 2 (two) times daily with a meal. 10/18/23  Yes Paz, Nolon Rod, MD  losartan (COZAAR) 100 MG tablet Take 1 tablet (100 mg total) by mouth daily. 11/16/23  Yes Paz, Nolon Rod, MD  atorvastatin (LIPITOR) 20 MG tablet Take 1 tablet (20 mg total) by mouth daily. 04/19/23   Wanda Plump, MD  baclofen (LIORESAL) 10 MG tablet Take 1 tablet (10 mg total) by mouth daily. 10/11/23   Kirsteins, Victorino Sparrow, MD  Cholecalciferol 1.25 MG (50000 UT) capsule Take 1 capsule (50,000 Units total) by mouth once a week. 07/13/23     clopidogrel (PLAVIX) 75 MG tablet Take 1 tablet (75 mg total) by mouth daily. 10/27/23   Micki Riley, MD  escitalopram (LEXAPRO) 20 MG tablet Take 1 tablet (20 mg total) by mouth daily. 09/10/23   Wanda Plump, MD  prochlorperazine (COMPAZINE) 10 MG tablet Take by mouth. 02/28/23   [provider]  Semaglutide-Weight Management (WEGOVY) 0.5 MG/0.5ML SOAJ 0.5 mL Subcutaneous weekly for 30 days    [provider]  tirzepatide (ZEPBOUND) 2.5 MG/0.5ML Pen Inject 2.5 mg into the skin once a week. 03/20/24     tirzepatide (ZEPBOUND) 5 MG/0.5ML Pen Inject 5 mg into the skin once a week. 03/20/24     tiZANidine (ZANAFLEX) 2 MG tablet Take 2 tablets (4 mg total) by mouth 3 (three) times daily. 09/23/23   Kirsteins, Victorino Sparrow, MD  traZODone (DESYREL) 100 MG tablet Take 1 tablet (100 mg total) by mouth at bedtime as needed for sleep. 02/16/24   Wanda Plump, MD    Family History Family History  Problem Relation Age of Onset   Diabetes Mother    Hypertension Mother    Glaucoma Mother    Colon cancer Mother        mother age 42   Cancer Mother    Anxiety disorder Mother    Obesity Mother    Rectal cancer Father        dx 28   Hypertension Father     Cancer Father    Sleep apnea Father    Alcoholism Father    Obesity Father  Diabetes Brother    Hypertension Brother    Diabetes Brother    Hypertension Brother    CAD Maternal Grandmother        MI age 63   Stroke Maternal Grandfather    Stroke Paternal Grandfather    Heart disease Maternal Aunt        x 2 did 2012 CAD-CHF   Lung cancer Maternal Aunt    CAD Cousin        cousind w/ CAD   Breast cancer Neg Hx    Esophageal cancer Neg Hx    Stomach cancer Neg Hx     Social History Social History   Tobacco Use   Smoking status: Never   Smokeless tobacco: Never  Vaping Use   Vaping status: Never Used  Substance Use Topics   Alcohol use: No   Drug use: No     Allergies   Bee venom, Iopamidol, Peanut-containing drug products, Aspirin, Latex, Zolpidem tartrate, and Hydrocodone   Review of Systems Review of Systems  Constitutional:  Negative for chills and fever.  Eyes:  Negative for redness.  Respiratory:  Negative for shortness of breath.   Gastrointestinal:  Negative for nausea and vomiting.  Neurological:  Positive for dizziness and syncope.     Physical Exam Triage Vital Signs ED Triage Vitals  Encounter Vitals Group     BP 03/30/24 1700 109/72     Systolic BP Percentile --      Diastolic BP Percentile --      Pulse Rate 03/30/24 1700 65     Resp 03/30/24 1700 18     Temp 03/30/24 1700 98.1 F (36.7 C)     Temp Source 03/30/24 1700 Oral     SpO2 03/30/24 1700 98 %     Weight 03/30/24 1657 143 lb (64.9 kg)     Height 03/30/24 1657 4\' 11"  (1.499 m)     Head Circumference --      Peak Flow --      Pain Score 03/30/24 1653 9     Pain Loc --      Pain Education --      Exclude from Growth Chart --    No data found.  Updated Vital Signs BP 109/72 (BP Location: Left Arm)   Pulse 65   Temp 98.1 F (36.7 C) (Oral)   Resp 18   Ht 4\' 11"  (1.499 m)   Wt 143 lb (64.9 kg)   SpO2 98%   BMI 28.88 kg/m   Visual Acuity Right Eye Distance:   Left  Eye Distance:   Bilateral Distance:    Right Eye Near:   Left Eye Near:    Bilateral Near:     Physical Exam Vitals and nursing note reviewed.  Constitutional:      General: She is not in acute distress.    Appearance: Normal appearance. She is not ill-appearing.  HENT:     Head:     Comments: Mild swelling appreciated to left frontal area, no wound appreciated Cardiovascular:     Rate and Rhythm: Normal rate and regular rhythm.  Pulmonary:     Effort: Pulmonary effort is normal. No respiratory distress.     Breath sounds: No wheezing, rhonchi or rales.  Neurological:     Mental Status: She is alert.      UC Treatments / Results  Labs (all labs ordered are listed, but only abnormal results are displayed) Labs Reviewed - No data to display  EKG  Radiology No results found.  Procedures Procedures (including critical care time)  Medications Ordered in UC Medications - No data to display  Initial Impression / Assessment and Plan / UC Course  I have reviewed the triage vital signs and the nursing notes.  Pertinent labs & imaging results that were available during my care of the patient were reviewed by me and considered in my medical decision making (see chart for details).    Given syncope of unknown cause and head injury recommended further evaluation in the emergency room.  Patient is agreeable to same.  Her son and husband will transport her via POV.  EKG without concerning findings.   Final Clinical Impressions(s) / UC Diagnoses   Final diagnoses:  Syncope, unspecified syncope type   Discharge Instructions   None    ED Prescriptions   None    PDMP not reviewed this encounter.   Vernestine Gondola, PA-C 03/30/24 1746

## 2024-03-30 NOTE — ED Triage Notes (Signed)
"  I fell this morning, I am not sure if I had a fainting spell or not". "I went to the potty, I felt dizzy I had set their for a while, I got up and fainted, next I work up with pain in my left side/back and had hit my head (left upper side)". No nausea or vomiting since. No visual changes.

## 2024-04-07 ENCOUNTER — Other Ambulatory Visit: Payer: Self-pay | Admitting: Internal Medicine

## 2024-04-07 DIAGNOSIS — I10 Essential (primary) hypertension: Secondary | ICD-10-CM

## 2024-04-10 ENCOUNTER — Other Ambulatory Visit (HOSPITAL_BASED_OUTPATIENT_CLINIC_OR_DEPARTMENT_OTHER): Payer: Self-pay

## 2024-04-10 MED ORDER — AMLODIPINE BESYLATE 5 MG PO TABS
5.0000 mg | ORAL_TABLET | Freq: Every day | ORAL | 1 refills | Status: DC
Start: 2024-04-10 — End: 2024-04-17
  Filled 2024-04-10: qty 30, 30d supply, fill #0

## 2024-04-12 ENCOUNTER — Encounter: Payer: Self-pay | Admitting: Physician Assistant

## 2024-04-13 ENCOUNTER — Encounter: Payer: Self-pay | Admitting: Physical Medicine & Rehabilitation

## 2024-04-13 ENCOUNTER — Encounter
Payer: PRIVATE HEALTH INSURANCE | Attending: Physical Medicine & Rehabilitation | Admitting: Physical Medicine & Rehabilitation

## 2024-04-13 VITALS — BP 105/68 | HR 73 | Ht <= 58 in | Wt 140.0 lb

## 2024-04-13 DIAGNOSIS — I69398 Other sequelae of cerebral infarction: Secondary | ICD-10-CM | POA: Diagnosis present

## 2024-04-13 DIAGNOSIS — G8114 Spastic hemiplegia affecting left nondominant side: Secondary | ICD-10-CM | POA: Diagnosis not present

## 2024-04-13 DIAGNOSIS — R269 Unspecified abnormalities of gait and mobility: Secondary | ICD-10-CM | POA: Diagnosis present

## 2024-04-13 MED ORDER — SODIUM CHLORIDE (PF) 0.9 % IJ SOLN
4.0000 mL | Freq: Once | INTRAMUSCULAR | Status: AC
  Administered 2024-04-13: 4 mL

## 2024-04-13 MED ORDER — PHENOL 5% (AQUEOUS) FOR INJECTION
4.0000 mL | Freq: Once | Status: AC
  Administered 2024-04-13: 4 mL

## 2024-04-13 MED ORDER — INCOBOTULINUMTOXINA 100 UNITS IM SOLR
200.0000 [IU] | Freq: Once | INTRAMUSCULAR | Status: AC
Start: 2024-04-13 — End: 2024-04-13
  Administered 2024-04-13: 200 [IU] via INTRAMUSCULAR

## 2024-04-13 NOTE — Progress Notes (Signed)
 Xeomin  Injection for spasticity using needle EMG guidance  Dilution: 200 Units/ml Indication: Severe spasticity which interferes with ADL,mobility and/or  hygiene and is unresponsive to medication management and other conservative care Informed consent was obtained after describing risks and benefits of the procedure with the patient. This includes bleeding, bruising, infection, excessive weakness, or medication side effects. A REMS form is on file and signed. Needle:  needle electrode Number of units per muscle 200 units total Left FCR 50 units Left FDS 50 units Left FDP 50 units Left FPL 50 units  All injections were done after obtaining appropriate EMG activity and after negative drawback for blood. The patient tolerated the procedure well. Post procedure instructions were given. A followup appointment was made.   Post procedure instructions and followup visit were given.   Phenol(lot #[id number], Exp 04/14/24) neurolysis of the LEFT tibial nerve  Indication: Severe spasticity in the plantar flexor muscles which is not responding to medical management and other conservative care and interfering with functional use.  Informed consent was obtained after describing the risks and benefits of the procedure with the patient this includes bleeding bruising and infection as well as medication side effects. The patient elected to proceed and has given written consent. Patient placed in a prone position on the exam table. External DC stimulation was applied to the popliteal space using a nerve stimulator. Plantar flexion twitch was obtained. The popliteal region was prepped with Betadine and then entered with a 22-gauge 40 mm needle electrode under electrical stimulation guidance. Plantar flexion which was obtained and confirmed. Then 4 cc of 5% phenol were injected. The patient tolerated procedure well. Post procedure instructions and followup visit were given.

## 2024-04-13 NOTE — Addendum Note (Signed)
 Addended by: Kasim Mccorkle W on: 04/13/2024 03:44 PM   Modules accepted: Orders

## 2024-04-13 NOTE — Patient Instructions (Signed)
 Tibial nerve block with phenol today. This medication may start taking the fact today however full effect will be at about one week Duration of the effect is 3-6 months Side effects of medication may include right heel numbness or burning. Call if you have burning pain so we can recommend any medication for that.   You received a Xeomin  injection today. You may experience soreness at the needle injection sites. Please call us  if any of the injection sites turns red after a couple days or if there is any drainage. You may experience muscle weakness as a result of Xeomin  This would improve with time but can take several weeks to improve. The Xeomin  should start working in about one week. The Xeomin  usually last 3 months. The injection can be repeated every 3 months as needed.

## 2024-04-14 ENCOUNTER — Ambulatory Visit: Payer: PRIVATE HEALTH INSURANCE | Admitting: Internal Medicine

## 2024-04-17 ENCOUNTER — Encounter: Payer: Self-pay | Admitting: Internal Medicine

## 2024-04-17 ENCOUNTER — Ambulatory Visit (INDEPENDENT_AMBULATORY_CARE_PROVIDER_SITE_OTHER): Payer: PRIVATE HEALTH INSURANCE | Admitting: Internal Medicine

## 2024-04-17 VITALS — BP 108/68 | HR 68 | Temp 98.5°F | Resp 18 | Ht <= 58 in | Wt 143.0 lb

## 2024-04-17 DIAGNOSIS — I10 Essential (primary) hypertension: Secondary | ICD-10-CM

## 2024-04-17 DIAGNOSIS — I959 Hypotension, unspecified: Secondary | ICD-10-CM

## 2024-04-17 NOTE — Progress Notes (Unsigned)
 Subjective:    Patient ID: Abigail Campos, female    DOB: [birth year], 53 y.o.   MRN: [id number]  DOS:  04/17/2024 Type of visit - description: Acute, BP management  Chart is reviewed:  March 30, 2024: Had a syncope, went to the ER. Rib x-ray no acute CT head and cervical spine: No acute CBC BMP okay Symptoms felt to be due to orthostatic hypotension versus a vasovagal reaction  BP when she saw physical medicine and rehab: 106/68.  Was recommended to discuss BP with PCP.  At this point she is feeling well. Reports no further syncopes. Denies chest pain or difficulty breathing.  No palpitations.  BP Readings from Last 3 Encounters:  04/17/24 108/68  04/13/24 105/68  03/30/24 (!) 116/91     Wt Readings from Last 3 Encounters:  04/17/24 143 lb (64.9 kg)  04/13/24 140 lb (63.5 kg)  03/30/24 143 lb (64.9 kg)     Review of Systems See above   Past Medical History:  Diagnosis Date   Allergy    Anemia    Anxiety and depression    Arthritis    Back pain    Blood transfusion without reported diagnosis    Essential hypertension 10/19/2007   03-2010: metoprolol  changed to bystolic  (was not feeling well on it, no specific allergy or reaction)     Food allergy    Hemiplegia (HCC)    Hyperlipemia    Hypertension    Joint pain    Migraine    Neuromuscular disorder (HCC)    left sided hemiplegia - has brace for l leg and walks with a cane   Obesity    Ovarian cancer (HCC)    Prediabetes    Sleep apnea    not wearing c-pap yet   Sleep apnea    Stroke (HCC) 2017   Vitamin D  deficiency     Past Surgical History:  Procedure Laterality Date   ABDOMINAL HYSTERECTOMY  11/14/2007   no oophorectomy per surgical report   BREAST BIOPSY Right 02/08/2023   MM RT BREAST BX W LOC DEV 1ST LESION IMAGE BX SPEC STEREO GUIDE 02/08/2023 [facility]   CESAREAN SECTION     [id number]   HERNIA REPAIR     INGUINAL HERNIA REPAIR     2004    Current Outpatient Medications   Medication Instructions   alendronate  (FOSAMAX ) 70 MG tablet Take 1 tablet (70 mg total) by mouth once a week with a full glass of water on an empty stomach.   amLODipine  (NORVASC ) 5 mg, Oral, Daily   atorvastatin  (LIPITOR) 20 mg, Oral, Daily   baclofen  (LIORESAL ) 10 mg, Oral, Daily   buPROPion  (WELLBUTRIN  XL) 150 mg, Oral, Daily   carvedilol  (COREG ) 25 mg, Oral, 2 times daily with meals   clopidogrel  (PLAVIX ) 75 mg, Oral, Daily   escitalopram  (LEXAPRO ) 20 mg, Oral, Daily   losartan  (COZAAR ) 100 mg, Oral, Daily   oxyCODONE  (ROXICODONE ) 2.5 mg, Oral, Every 6 hours PRN   prochlorperazine  (COMPAZINE ) 10 MG tablet Take by mouth.   tiZANidine  (ZANAFLEX ) 4 mg, Oral, 3 times daily   traZODone  (DESYREL ) 100 mg, Oral, At bedtime PRN   Vitamin D3 50,000 Units, Oral, Weekly   Zepbound  5 mg, Subcutaneous, Weekly       Objective:   Physical Exam BP 108/68   Pulse 68   Temp 98.5 F (36.9 C) (Oral)   Resp 18   Ht 4\' 10"  (1.473 m)   Wt 143 lb (64.9  kg)   SpO2 96%   BMI 29.89 kg/m  General:   Well developed, NAD, BMI noted. HEENT:  Normocephalic . Face symmetric, atraumatic Lungs:  CTA B Normal respiratory effort, no intercostal retractions, no accessory muscle use. Heart: RRR,  no murmur.  Lower extremities: no pretibial edema bilaterally  Skin: Not pale. Not jaundice Neurologic:  alert & oriented X3.  Speech normal, at baseline  psych--  Cognition and judgment appear intact.  Cooperative with normal attention span and concentration.  Behavior appropriate. No anxious or depressed appearing.      Assessment   Assessment   Prediabetes: A1c 6.0 (April 2017)  HTN-- HCTZ increased creatinine (07-2023) Hyperlipidemia Depression, insomnia, anxiety GERD Stroke 03-2016: Sequela (L)  spastic hemiplegia   ICH, right basilar ganglia due to HTN emergency, + encephalopathy, + dysphagia, aphasia, dysarthria Residual L spasticity.  CTA neck 03-2016 neg CTA head 10-17 (-)  aneurysm Headaches , migraines dx after 2nd pregnancy, (-) CT head 2014 and 2015 OSA: Sleep study 08-2019, saw Dr. Linder Revere, intolerant to CPAP  Osteoporosis.  T-score 04/2023 -3.2.  Rx Fosamax  Vitamin D  deficiency FH CAD: Myoview  09/2023 (-)  PLAN Syncope: Went to the ER, workup negative.  Vasovagal?  See next HTN: BP has been low for the last few days,  started Zepbound  for weight management 02/14/2024, related?. Plan: Stop amlodipine , continue losartan , carvedilol , monitor BPs  To let me know if BPs are not at goal.  See AVS Weight management: On Zepbound , prescribed elsewhere Postvaccine fever: Seen at the ER 02/23/2024, had fever felt to be due to Shingrix  vaccine RTC already scheduled for September

## 2024-04-17 NOTE — Patient Instructions (Addendum)
 Stop amlodipine   Check the  blood pressure regularly Blood pressure goal:  between 115/65 and  135/85. If it is consistently higher or lower, let me know  See you in September

## 2024-04-18 NOTE — Assessment & Plan Note (Signed)
 Syncope: Went to the ER, workup negative.  Vasovagal?  See next HTN: BP has been low for the last few days,  started Zepbound  for weight management 02/14/2024, related?. Plan: Stop amlodipine , continue losartan , carvedilol , monitor BPs  To let me know if BPs are not at goal.  See AVS Weight management: On Zepbound , prescribed elsewhere Postvaccine fever: Seen at the ER 02/23/2024, had fever felt to be due to Shingrix  vaccine RTC already scheduled for September

## 2024-04-24 ENCOUNTER — Other Ambulatory Visit (HOSPITAL_BASED_OUTPATIENT_CLINIC_OR_DEPARTMENT_OTHER): Payer: Self-pay

## 2024-04-24 ENCOUNTER — Encounter: Payer: Self-pay | Admitting: Internal Medicine

## 2024-04-24 MED ORDER — ZEPBOUND 5 MG/0.5ML ~~LOC~~ SOAJ
5.0000 mg | SUBCUTANEOUS | 0 refills | Status: DC
  Filled 2024-04-24: qty 2, 28d supply, fill #0

## 2024-04-25 ENCOUNTER — Ambulatory Visit (INDEPENDENT_AMBULATORY_CARE_PROVIDER_SITE_OTHER): Payer: PRIVATE HEALTH INSURANCE

## 2024-04-25 ENCOUNTER — Other Ambulatory Visit (HOSPITAL_BASED_OUTPATIENT_CLINIC_OR_DEPARTMENT_OTHER): Payer: Self-pay

## 2024-04-25 ENCOUNTER — Other Ambulatory Visit: Payer: Self-pay

## 2024-04-25 VITALS — Ht <= 58 in | Wt 143.0 lb

## 2024-04-25 DIAGNOSIS — Z0000 Encounter for general adult medical examination without abnormal findings: Secondary | ICD-10-CM

## 2024-04-25 NOTE — Patient Instructions (Addendum)
 [name] , Thank you for taking time out of your busy schedule to complete your Annual Wellness Visit with me. I enjoyed our conversation and look forward to speaking with you again next year. I, as well as your care team,  appreciate your ongoing commitment to your health goals. Please review the following plan we discussed and let me know if I can assist you in the future. Your Game plan/ To Do List    Referrals: If you haven't heard from the office you've been referred to, please reach out to them at the phone provided.   Follow up Visits: Next Medicare AWV with our clinical staff: 05/03/25 @ 10:30a   Have you seen your provider in the last 6 months (3 months if uncontrolled diabetes)? Yes 04/17/24 Next Office Visit with your provider: 08/23/24  Clinician Recommendations:  Aim for 30 minutes of exercise or brisk walking, 6-8 glasses of water, and 5 servings of fruits and vegetables each day.       This is a list of the screening recommended for you and due dates:  Health Maintenance  Topic Date Due   COVID-19 Vaccine (7 - 2024-25 season) 08/15/2023   Flu Shot  07/14/2024   Colon Cancer Screening  10/05/2024   Medicare Annual Wellness Visit  04/25/2025   Mammogram  03/30/2026   DTaP/Tdap/Td vaccine (4 - Td or Tdap) 02/20/2034   Hepatitis C Screening  Completed   HIV Screening  Completed   Pneumococcal Vaccination  Aged Out   HPV Vaccine  Aged Out   Meningitis B Vaccine  Aged Out   Zoster (Shingles) Vaccine  Discontinued    Advanced directives: (Copy Requested) Please bring a copy of your health care power of attorney and living will to the office to be added to your chart at your convenience. You can mail to [facility] [address] [place], [place] [address] or email to [email or web address] Advance Care Planning is important because it:  [x]  Makes sure you receive the medical care that is consistent with your values, goals, and preferences  [x]  It  provides guidance to your family and loved ones and reduces their decisional burden about whether or not they are making the right decisions based on your wishes.  Follow the link provided in your after visit summary or read over the paperwork we have mailed to you to help you started getting your Advance Directives in place. If you need assistance in completing these, please reach out to us  so that we can help you!  See attachments for Preventive Care and Fall Prevention Tips.

## 2024-04-25 NOTE — Progress Notes (Signed)
 Subjective:   Abigail Campos is a 53 y.o. who presents for a Medicare Wellness preventive visit.  As a reminder, Annual Wellness Visits don't include a physical exam, and some assessments may be limited, especially if this visit is performed virtually. We may recommend an in-person visit if needed.  Visit Complete: Virtual I connected with  Abigail Campos on 04/25/24 by a audio enabled telemedicine application and verified that I am speaking with the correct person using two identifiers.  Patient Location: Home  Provider Location: Home Office  I discussed the limitations of evaluation and management by telemedicine. The patient expressed understanding and agreed to proceed.  Vital Signs: Because this visit was a virtual/telehealth visit, some criteria may be missing or patient reported. Any vitals not documented were not able to be obtained and vitals that have been documented are patient reported.    Persons Participating in Visit: Patient.  AWV Questionnaire: No: Patient Medicare AWV questionnaire was not completed prior to this visit.  Cardiac Risk Factors include: advanced age (>31men, >41 women);diabetes mellitus;hypertension     Objective:     Today's Vitals   04/25/24 1054  Weight: 143 lb (64.9 kg)  Height: 4\' 10"  (1.473 m)   Body mass index is 29.89 kg/m.     04/25/2024   11:06 AM 03/30/2024    6:20 PM 02/23/2024    4:23 PM 12/25/2023    6:51 PM 09/10/2023   12:24 PM 04/26/2023   10:21 AM 02/28/2023    5:29 PM  Advanced Directives  Does Patient Have a Medical Advance Directive? Yes No No No No Yes No  Type of Estate agent of [place];Living will        Does patient want to make changes to medical advance directive?      No - Patient declined   Copy of Healthcare Power of Attorney in Chart? No - copy requested     No - copy requested   Would patient like information on creating a medical advance directive?     No - Patient declined       Current Medications (verified) Outpatient Encounter Medications as of 04/25/2024  Medication Sig   alendronate  (FOSAMAX ) 70 MG tablet Take 1 tablet (70 mg total) by mouth once a week with a full glass of water on an empty stomach.   atorvastatin  (LIPITOR) 20 MG tablet Take 1 tablet (20 mg total) by mouth daily.   baclofen  (LIORESAL ) 10 MG tablet Take 1 tablet (10 mg total) by mouth daily.   buPROPion  (WELLBUTRIN  XL) 150 MG 24 hr tablet Take 1 tablet (150 mg total) by mouth daily.   carvedilol  (COREG ) 25 MG tablet Take 1 tablet (25 mg total) by mouth 2 (two) times daily with a meal.   Cholecalciferol  1.25 MG (50000 UT) capsule Take 1 capsule (50,000 Units total) by mouth once a week.   clopidogrel  (PLAVIX ) 75 MG tablet Take 1 tablet (75 mg total) by mouth daily.   escitalopram  (LEXAPRO ) 20 MG tablet Take 1 tablet (20 mg total) by mouth daily.   losartan  (COZAAR ) 100 MG tablet Take 1 tablet (100 mg total) by mouth daily.   oxyCODONE  (ROXICODONE ) 5 MG immediate release tablet Take 0.5 tablets (2.5 mg total) by mouth every 6 (six) hours as needed for severe pain (pain score 7-10). (Patient not taking: Reported on 04/17/2024)   prochlorperazine  (COMPAZINE ) 10 MG tablet Take by mouth.   tirzepatide  (ZEPBOUND ) 5 MG/0.5ML Pen Inject 5 mg into the  skin once a week.   tiZANidine  (ZANAFLEX ) 2 MG tablet Take 2 tablets (4 mg total) by mouth 3 (three) times daily. (Patient not taking: Reported on 04/17/2024)   traZODone  (DESYREL ) 100 MG tablet Take 1 tablet (100 mg total) by mouth at bedtime as needed for sleep.   Facility-Administered Encounter Medications as of 04/25/2024  Medication   incobotulinumtoxinA  (XEOMIN ) 100 units injection 100 Units   incobotulinumtoxinA  (XEOMIN ) 100 units injection 200 Units   PHENOL 5% (AQUEOUS) FOR INJECTION 5 mL    Allergies (verified) Bee venom, Iopamidol , Peanut-containing drug products, Aspirin, Latex, Zolpidem  tartrate, Shingrix  [zoster vac recomb adjuvanted], and  Hydrocodone    History: Past Medical History:  Diagnosis Date   Allergy    Anemia    Anxiety and depression    Arthritis    Back pain    Blood transfusion without reported diagnosis    Essential hypertension 10/19/2007   03-2010: metoprolol  changed to bystolic  (was not feeling well on it, no specific allergy or reaction)     Food allergy    Hemiplegia (HCC)    Hyperlipemia    Hypertension    Joint pain    Migraine    Neuromuscular disorder (HCC)    left sided hemiplegia - has brace for l leg and walks with a cane   Obesity    Ovarian cancer (HCC)    Prediabetes    Sleep apnea    not wearing c-pap yet   Sleep apnea    Stroke (HCC) 2017   Vitamin D  deficiency    Past Surgical History:  Procedure Laterality Date   ABDOMINAL HYSTERECTOMY  11/14/2007   no oophorectomy per surgical report   BREAST BIOPSY Right 02/08/2023   MM RT BREAST BX W LOC DEV 1ST LESION IMAGE BX SPEC STEREO GUIDE 02/08/2023 [facility]   CESAREAN SECTION     [id number]   HERNIA REPAIR     INGUINAL HERNIA REPAIR     2004   Family History  Problem Relation Age of Onset   Diabetes Mother    Hypertension Mother    Glaucoma Mother    Colon cancer Mother        mother age 38   Cancer Mother    Anxiety disorder Mother    Obesity Mother    Rectal cancer Father        dx 32   Hypertension Father    Cancer Father    Sleep apnea Father    Alcoholism Father    Obesity Father    Diabetes Brother    Hypertension Brother    Diabetes Brother    Hypertension Brother    CAD Maternal Grandmother        MI age 71   Stroke Maternal Grandfather    Stroke Paternal Grandfather    Heart disease Maternal Aunt        x 2 did 2012 CAD-CHF   Lung cancer Maternal Aunt    CAD Cousin        cousind w/ CAD   Breast cancer Neg Hx    Esophageal cancer Neg Hx    Stomach cancer Neg Hx    Social History   Socioeconomic History   Marital status: Married    Spouse name: Marijean Shouts   Number of children: 2   Years  of education: Not on file   Highest education level: Associate degree: occupational, Scientist, product/process development, or vocational program  Occupational History   Occupation: disable d/t stroke Magazine features editor:  [employer]  Tobacco Use   Smoking status: Never   Smokeless tobacco: Never  Vaping Use   Vaping status: Never Used  Substance and Sexual Activity   Alcohol  use: No   Drug use: No   Sexual activity: Not Currently    Birth control/protection: Surgical  Other Topics Concern   Not on file  Social History Narrative   Lives with husband and son (2007)   Left Hand prior to stroke   Drinks 1-2 cups daily   Daughter (1998), married    Son 2008   Social Drivers of Health   Financial Resource Strain: Low Risk  (04/25/2024)   Overall Financial Resource Strain (CARDIA)    Difficulty of Paying Living Expenses: Not hard at all  Recent Concern: Financial Resource Strain - Medium Risk (02/16/2024)   Overall Financial Resource Strain (CARDIA)    Difficulty of Paying Living Expenses: Somewhat hard  Food Insecurity: No Food Insecurity (04/25/2024)   Hunger Vital Sign    Worried About Running Out of Food in the Last Year: Never true    Ran Out of Food in the Last Year: Never true  Recent Concern: Food Insecurity - Food Insecurity Present (02/16/2024)   Hunger Vital Sign    Worried About Running Out of Food in the Last Year: Sometimes true    Ran Out of Food in the Last Year: Often true  Transportation Needs: No Transportation Needs (04/25/2024)   PRAPARE - Administrator, Civil Service (Medical): No    Lack of Transportation (Non-Medical): No  Physical Activity: Sufficiently Active (04/25/2024)   Exercise Vital Sign    Days of Exercise per Week: 5 days    Minutes of Exercise per Session: 30 min  Recent Concern: Physical Activity - Insufficiently Active (02/16/2024)   Exercise Vital Sign    Days of Exercise per Week: 3 days    Minutes of Exercise per Session: 10 min  Stress: No Stress  Concern Present (04/25/2024)   [employer] of Occupational Health - Occupational Stress Questionnaire    Feeling of Stress : Not at all  Recent Concern: Stress - Stress Concern Present (02/16/2024)   [employer] of Occupational Health - Occupational Stress Questionnaire    Feeling of Stress : To some extent  Social Connections: Socially Integrated (04/25/2024)   Social Connection and Isolation Panel [NHANES]    Frequency of Communication with Friends and Family: More than three times a week    Frequency of Social Gatherings with Friends and Family: More than three times a week    Attends Religious Services: More than 4 times per year    Active Member of [employer] or Organizations: Yes    Attends Engineer, structural: More than 4 times per year    Marital Status: Married    Tobacco Counseling Counseling given: Not Answered    Clinical Intake:  Pre-visit preparation completed: Yes  Pain : No/denies pain     BMI - recorded: 29.89 Nutritional Status: BMI 25 -29 Overweight Nutritional Risks: None Diabetes: Yes CBG done?: No Did pt. bring in CBG monitor from home?: No  Lab Results  Component Value Date   HGBA1C 5.6 02/21/2024   HGBA1C 5.5 01/05/2023   HGBA1C 5.6 11/24/2021     How often do you need to have someone help you when you read instructions, pamphlets, or other written materials from your doctor or pharmacy?: 3 - Sometimes (Son assist)  Interpreter Needed?: No  Information entered by :: [employer]  Barney Gertsch LPN   Activities of Daily Living      04/25/2024   11:03 AM  In your present state of health, do you have any difficulty performing the following activities:  Hearing? 0  Vision? 0  Difficulty concentrating or making decisions? 0  Walking or climbing stairs? 1  Comment Uses a Systems analyst or bathing? 0  Doing errands, shopping? Heritage manager and eating ? N  Using the Toilet? N  In the past six months, have you  accidently leaked urine? N  Do you have problems with loss of bowel control? N  Managing your Medications? N  Managing your Finances? N  Housekeeping or managing your Housekeeping? N    Patient Care Team: Ezell Hollow, MD as PCP - General Lydia Sams, Cesario Collum, MD as Consulting Physician (Physical Medicine and Rehabilitation) Consuelo Denmark, MD as Consulting Physician (Neurology) Jonn Nett, DO as Referring Physician (Family Medicine)  Indicate any recent Medical Services you may have received from other than [facility] providers in the past year (date may be approximate).     Assessment:    This is a routine wellness examination for Evona.  Hearing/Vision screen Hearing Screening - Comments:: Denies hearing difficulties   Vision Screening - Comments:: Wears rx glasses - up to date with routine eye exams with  [facility]   Goals Addressed               This Visit's Progress     Increase physical activity (pt-stated)        Remain active       Depression Screen      04/25/2024   11:02 AM 04/17/2024    3:26 PM 04/13/2024   11:47 AM 02/21/2024   11:05 AM 01/13/2024   10:09 AM 10/06/2023    1:20 PM 08/20/2023   11:48 AM  PHQ 2/9 Scores  PHQ - 2 Score 0 0 0 0 0 0 0  PHQ- 9 Score 0 3  0  0     Fall Risk      04/25/2024   11:04 AM 04/17/2024    2:46 PM 04/13/2024   11:46 AM 02/21/2024   11:02 AM 01/13/2024   10:09 AM  Fall Risk   Falls in the past year? 1 1 1  0 0  Number falls in past yr: 0 0 0 0   Comment   last week (around 5/25)    Injury with Fall? 1 1 1  0   Comment Bruised Ribs, followed by medical attention  bumped head and fx rib after fainting, did seek tx    Risk for fall due to :   Impaired balance/gait    Follow up Falls prevention discussed;Falls evaluation completed Falls evaluation completed;Education provided  Falls evaluation completed;Education provided     MEDICARE RISK AT HOME:   Medicare Risk at Home Any stairs in or around the home?: No If so, are there  any without handrails?: No Home free of loose throw rugs in walkways, pet beds, electrical cords, etc?: Yes Adequate lighting in your home to reduce risk of falls?: Yes Life alert?: No Use of a cane, walker or w/c?: Yes Grab bars in the bathroom?: Yes Shower chair or bench in shower?: Yes Elevated toilet seat or a handicapped toilet?: No  TIMED UP AND GO:  Was the test performed?  No  Cognitive Function: 6CIT completed    10/27/2023   12:47 PM 07/23/2022    9:28 AM  06/11/2021   11:10 AM  MMSE - Mini Mental State Exam  Orientation to time 5 5 5   Orientation to Place 5 5 5   Registration 3 3 3   Attention/ Calculation 5 2 3   Recall 2 2 2   Language- name 2 objects 2 2 2   Language- repeat 1 1 1   Language- follow 3 step command 3 3 3   Language- read & follow direction 1 1 1   Write a sentence 1 1 1   Copy design 0 0 1  Total score 28 25 27         04/25/2024   11:06 AM 04/26/2023   10:28 AM 04/17/2022    8:25 AM  6CIT Screen  What Year? 0 points 0 points 0 points  What month? 0 points 0 points 0 points  What time? 0 points 0 points 0 points  Count back from 20 0 points 0 points 0 points  Months in reverse 0 points 0 points 0 points  Repeat phrase 0 points 2 points 0 points  Total Score 0 points 2 points 0 points    Immunizations Immunization History  Administered Date(s) Administered   Hepatitis B, ADULT 10/19/2014   Influenza, Seasonal, Injecte, Preservative Fre 09/14/2023   Influenza,inj,Quad PF,6+ Mos 11/21/2013, 08/31/2016, 11/02/2017, 09/20/2018, 09/05/2020, 09/05/2021, 08/19/2022   PFIZER Comirnaty(Gray Top)Covid-19 Tri-Sucrose Vaccine 07/30/2021   PFIZER(Purple Top)SARS-COV-2 Vaccination 08/08/2020, 08/23/2020, 02/04/2021   PPD Test 10/17/2014   Pfizer Covid-19 Vaccine Bivalent Booster 38yrs & up 09/30/2021   Pfizer(Comirnaty)Fall Seasonal Vaccine 12 years and older 12/24/2022   Pneumococcal Conjugate-13 12/28/2016   Pneumococcal Polysaccharide-23 04/21/2018    Tdap 12/15/2007, 10/17/2014, 02/21/2024   Zoster Recombinant(Shingrix ) 02/21/2024    Screening Tests Health Maintenance  Topic Date Due   COVID-19 Vaccine (7 - 2024-25 season) 08/15/2023   INFLUENZA VACCINE  07/14/2024   Colonoscopy  10/05/2024   Medicare Annual Wellness (AWV)  04/25/2025   MAMMOGRAM  03/30/2026   DTaP/Tdap/Td (4 - Td or Tdap) 02/20/2034   Hepatitis C Screening  Completed   HIV Screening  Completed   Pneumococcal Vaccine 29-13 Years old  Aged Out   HPV VACCINES  Aged Out   Meningococcal B Vaccine  Aged Out   Zoster Vaccines- Shingrix   Discontinued    Health Maintenance  Health Maintenance Due  Topic Date Due   COVID-19 Vaccine (7 - 2024-25 season) 08/15/2023   Health Maintenance Items Addressed:   Additional Screening:  Vision Screening: Recommended annual ophthalmology exams for early detection of glaucoma and other disorders of the eye.  Dental Screening: Recommended annual dental exams for proper oral hygiene  Community Resource Referral / Chronic Care Management: CRR required this visit?  No   CCM required this visit?  No   Plan:    I have personally reviewed and noted the following in the patient's chart:   Medical and social history Use of alcohol , tobacco or illicit drugs  Current medications and supplements including opioid prescriptions. Patient is not currently taking opioid prescriptions. Functional ability and status Nutritional status Physical activity Advanced directives List of other physicians Hospitalizations, surgeries, and ER visits in previous 12 months Vitals Screenings to include cognitive, depression, and falls Referrals and appointments  In addition, I have reviewed and discussed with patient certain preventive protocols, quality metrics, and best practice recommendations. A written personalized care plan for preventive services as well as general preventive health recommendations were provided to  patient.   Dewayne Ford, LPN   05/17/5408   After Visit Summary: (MyChart)  Due to this being a telephonic visit, the after visit summary with patients personalized plan was offered to patient via MyChart   Notes: Nothing significant to report at this time.

## 2024-04-28 ENCOUNTER — Encounter: Payer: Self-pay | Admitting: Physical Medicine & Rehabilitation

## 2024-05-09 ENCOUNTER — Other Ambulatory Visit (HOSPITAL_BASED_OUTPATIENT_CLINIC_OR_DEPARTMENT_OTHER): Payer: Self-pay

## 2024-05-10 ENCOUNTER — Other Ambulatory Visit (HOSPITAL_BASED_OUTPATIENT_CLINIC_OR_DEPARTMENT_OTHER): Payer: Self-pay

## 2024-05-15 ENCOUNTER — Other Ambulatory Visit (HOSPITAL_BASED_OUTPATIENT_CLINIC_OR_DEPARTMENT_OTHER): Payer: Self-pay

## 2024-05-15 ENCOUNTER — Encounter (HOSPITAL_BASED_OUTPATIENT_CLINIC_OR_DEPARTMENT_OTHER): Payer: Self-pay

## 2024-05-15 MED ORDER — ZEPBOUND 5 MG/0.5ML ~~LOC~~ SOAJ
5.0000 mg | SUBCUTANEOUS | 0 refills | Status: DC
  Filled 2024-05-15 (×2): qty 2, 28d supply, fill #0

## 2024-05-17 ENCOUNTER — Other Ambulatory Visit: Payer: Self-pay | Admitting: Internal Medicine

## 2024-05-17 ENCOUNTER — Other Ambulatory Visit (HOSPITAL_BASED_OUTPATIENT_CLINIC_OR_DEPARTMENT_OTHER): Payer: Self-pay

## 2024-05-17 ENCOUNTER — Other Ambulatory Visit (HOSPITAL_COMMUNITY): Payer: Self-pay

## 2024-05-17 MED ORDER — LOSARTAN POTASSIUM 100 MG PO TABS
100.0000 mg | ORAL_TABLET | Freq: Every day | ORAL | 1 refills | Status: DC
  Filled 2024-05-17 – 2024-05-30 (×2): qty 90, 90d supply, fill #0
  Filled 2024-09-24: qty 30, 30d supply, fill #1
  Filled 2024-10-20: qty 30, 30d supply, fill #2
  Filled 2024-11-29: qty 30, 30d supply, fill #3

## 2024-05-29 ENCOUNTER — Other Ambulatory Visit (HOSPITAL_BASED_OUTPATIENT_CLINIC_OR_DEPARTMENT_OTHER): Payer: Self-pay

## 2024-05-29 NOTE — Progress Notes (Signed)
 Ellouise Console, PA-C [address] [place], [place]  [address] Phone: [phone]   Gastroenterology Consultation  Referring Provider:     Amon Aloysius BRAVO, MD Primary Care Physician:  Amon Aloysius BRAVO, MD Primary Gastroenterologist:  Ellouise Console, PA-C / Elspeth Naval, MD  Reason for Consultation:     Constipation        HPI:   Abigail Campos is a 53 y.o. y/o female referred for consultation & management  by Amon Aloysius BRAVO, MD. Here to evaluate constipation.  She is taking MiraLAX  1 capful once daily in the morning which is not controlling constipation.  She also tried Linzess  72 mcg once daily with little benefit.  Currently having a bowel movement 3 times per week with hard stools.  She has upper abdominal cramping prior to bowel movements.  She denies rectal bleeding or weight loss.  Admits to weight gain.  No abdominal pain today.  09/2019 last colonoscopy by Dr. Naval: Moderate internal hemorrhoids.  No polyps.  Good prep.  5-year repeat due to family history of colon cancer (mother age 39s), which will be due 09/2024.  PMH: History of CVA with hemiplegia and spastic hemiparesis, sleep apnea, dysphagia, anxiety, depression, neuropathic pain.  Currently on Plavix .  Past Medical History:  Diagnosis Date   Allergy    Anemia    Anxiety and depression    Arthritis    Back pain    Blood transfusion without reported diagnosis    Depression    DM (diabetes mellitus) (HCC)    Essential hypertension 10/19/2007   03-2010: metoprolol  changed to bystolic  (was not feeling well on it, no specific allergy or reaction)     Food allergy    Hemiplegia (HCC)    Hyperlipemia    Hypertension    Joint pain    Migraine    Neuromuscular disorder (HCC)    left sided hemiplegia - has brace for l leg and walks with a cane   Obesity    Ovarian cancer (HCC)    Pneumonia    Prediabetes    Sleep apnea    not wearing c-pap yet   Stroke (HCC) 2017   Vitamin D  deficiency     Past  Surgical History:  Procedure Laterality Date   ABDOMINAL HYSTERECTOMY  11/14/2007   no oophorectomy per surgical report   BREAST BIOPSY Right 02/08/2023   MM RT BREAST BX W LOC DEV 1ST LESION IMAGE BX SPEC STEREO GUIDE 02/08/2023 [facility]   CESAREAN SECTION     x [phone]   UMBILICAL HERNIA REPAIR     2004    Prior to Admission medications   Medication Sig Start Date End Date Taking? Authorizing Provider  alendronate  (FOSAMAX ) 70 MG tablet Take 1 tablet (70 mg total) by mouth once a week with a full glass of water on an empty stomach. 05/06/23  Yes   atorvastatin  (LIPITOR) 20 MG tablet Take 1 tablet (20 mg total) by mouth daily. 04/19/23  Yes Paz, Aloysius BRAVO, MD  baclofen  (LIORESAL ) 10 MG tablet Take 1 tablet (10 mg total) by mouth daily. 10/11/23  Yes Kirsteins, Prentice BRAVO, MD  buPROPion  (WELLBUTRIN  XL) 150 MG 24 hr tablet Take 1 tablet (150 mg total) by mouth daily. 10/06/23  Yes Paz, Aloysius BRAVO, MD  carvedilol  (COREG ) 25 MG tablet Take 1 tablet (25 mg total) by mouth 2 (two) times daily with a meal. 10/18/23  Yes Amon Aloysius BRAVO, MD  Cholecalciferol  1.25  MG (50000 UT) capsule Take 1 capsule (50,000 Units total) by mouth once a week. 07/13/23  Yes   clopidogrel  (PLAVIX ) 75 MG tablet Take 1 tablet (75 mg total) by mouth daily. 10/27/23  Yes Rosemarie Eather RAMAN, MD  escitalopram  (LEXAPRO ) 20 MG tablet Take 1 tablet (20 mg total) by mouth daily. 09/10/23  Yes Amon Aloysius BRAVO, MD  linaclotide  (LINZESS ) 72 MCG CAPS capsule Take 1 capsule (72 mcg total) by mouth daily before breakfast. 05/30/24 11/26/24 Yes [place], PA-C  losartan  (COZAAR ) 100 MG tablet Take 1 tablet (100 mg total) by mouth daily. 05/17/24  Yes Paz, Aloysius BRAVO, MD  prochlorperazine  (COMPAZINE ) 10 MG tablet Take by mouth. 02/28/23  Yes [provider]  tirzepatide  (ZEPBOUND ) 5 MG/0.5ML Pen Inject 5 mg into the skin once a week. 05/15/24  Yes   tiZANidine  (ZANAFLEX ) 2 MG tablet Take 2 tablets (4 mg total) by mouth 3 (three) times daily.  09/23/23  Yes Kirsteins, Prentice BRAVO, MD  traZODone  (DESYREL ) 100 MG tablet Take 1 tablet (100 mg total) by mouth at bedtime as needed for sleep. 02/16/24  Yes Paz, Jose E, MD  tirzepatide  (ZEPBOUND ) 7.5 MG/0.5ML Pen Inject 7.5 mg into the skin once a week. Patient not taking: Reported on 05/30/2024    [provider]     Family History  Problem Relation Age of Onset   Diabetes Mother    Hypertension Mother    Glaucoma Mother    Colon cancer Mother        mother age 24   Anxiety disorder Mother    Obesity Mother    Rectal cancer Father        dx 28   Hypertension Father    Sleep apnea Father    Alcoholism Father    Obesity Father    Prostate cancer Father    Kidney cancer Father    COPD Father    Heart failure Father    Diabetes Brother    Hypertension Brother    Diabetes Brother    Hypertension Brother    CAD Maternal Grandmother        MI age 41   Stroke Maternal Grandfather    Stroke Paternal Grandfather    Heart disease Maternal Aunt        x 2 did 2012 CAD-CHF   Lung cancer Maternal Aunt    CAD Cousin        cousind w/ CAD   Breast cancer Neg Hx    Esophageal cancer Neg Hx    Stomach cancer Neg Hx      Social History   Tobacco Use   Smoking status: Never   Smokeless tobacco: Never  Vaping Use   Vaping status: Never Used  Substance Use Topics   Alcohol  use: No   Drug use: No    Allergies as of 05/30/2024 - Review Complete 05/30/2024  Allergen Reaction Noted   Bee venom Anaphylaxis 09/25/2015   Iopamidol  Hives and Itching 10/07/2016   Peanut-containing drug products Anaphylaxis 10/19/2007   Aspirin Other (See Comments) 10/19/2007   Latex Hives 10/13/2011   Zolpidem  tartrate Other (See Comments) and Hives 03/22/2015   Shingrix  [zoster vac recomb adjuvanted] Other (See Comments) 04/17/2024   Hydrocodone  Itching 08/17/2012    Review of Systems:    All systems reviewed and negative except where noted in HPI.   Physical Exam:  BP 122/86 (BP  Location: Right Arm, Patient Position: Sitting, Cuff Size: Large)   Pulse 72  Ht 4' 10 (1.473 m) Comment: pt stated-unable to obtain at this moment  Wt 154 lb 4 oz (70 kg)   BMI 32.24 kg/m  No LMP recorded. Patient has had a hysterectomy.  General:   Alert,  Well-developed, well-nourished, pleasant and cooperative in NAD; Left body weakness. Lungs:  Respirations even and unlabored.  Clear throughout to auscultation.   No wheezes, crackles, or rhonchi. No acute distress. Heart:  Regular rate and rhythm; no murmurs, clicks, rubs, or gallops. Abdomen:  Normal bowel sounds.  No bruits.  Soft, and obese without masses, hepatosplenomegaly or hernias noted.  No Tenderness.  No guarding or rebound tenderness.    Neurologic:  Alert and oriented x3;  Left upper and lower extremity weakness.  Walks with a cane.  Slow speech.  Is able to get on and off exam table with my help. Psych:  Alert and cooperative. Normal mood and affect.  Imaging Studies: No results found.  Labs: CBC    Component Value Date/Time   WBC 4.6 03/30/2024 1829   RBC 4.37 03/30/2024 1829   HGB 13.1 03/30/2024 1829   HGB 13.8 12/26/2020 1053   HGB 9.9 (L) 12/20/2007 1316   HCT 40.9 03/30/2024 1829   HCT 41.3 12/26/2020 1053   HCT 30.8 (L) 12/20/2007 1316   PLT 181 03/30/2024 1829   PLT 191 12/26/2020 1053   MCV 93.6 03/30/2024 1829   MCV 93 12/26/2020 1053   MCV 76 (L) 12/20/2007 1316   MCH 30.0 03/30/2024 1829   MCHC 32.0 03/30/2024 1829   RDW 13.7 03/30/2024 1829   RDW 12.6 12/26/2020 1053   RDW 17.3 (H) 12/20/2007 1316   LYMPHSABS 1.3 02/21/2024 1145   LYMPHSABS 1.5 12/26/2020 1053   LYMPHSABS 1.5 12/20/2007 1316   MONOABS 0.4 02/21/2024 1145   EOSABS 0.0 02/21/2024 1145   EOSABS 0.0 12/26/2020 1053   EOSABS 0.2 12/20/2007 1316   BASOSABS 0.0 02/21/2024 1145   BASOSABS 0.0 12/26/2020 1053   BASOSABS 0.0 12/20/2007 1316    CMP     Component Value Date/Time   NA 136 03/30/2024 1829   NA 137 12/26/2020  1053   K 4.0 03/30/2024 1829   CL 104 03/30/2024 1829   CO2 26 03/30/2024 1829   GLUCOSE 83 03/30/2024 1829   BUN 14 03/30/2024 1829   BUN 9 12/26/2020 1053   CREATININE 0.93 03/30/2024 1829   CREATININE 0.99 12/18/2019 1553   CALCIUM  10.0 03/30/2024 1829   PROT 6.9 02/21/2024 1145   PROT 7.6 12/26/2020 1053   ALBUMIN 3.9 02/21/2024 1145   ALBUMIN 4.5 12/26/2020 1053   AST 22 02/21/2024 1145   ALT 26 02/21/2024 1145   ALKPHOS 83 02/21/2024 1145   BILITOT 0.4 02/21/2024 1145   BILITOT 0.4 12/26/2020 1053   GFRNONAA >60 03/30/2024 1829   GFRAA 82 12/26/2020 1053    Assessment and Plan:   Abigail Campos is a 53 y.o. y/o female has been referred for:  1.  Constipation  -Increase Linzess  to 145 mcg 1 tablet once daily, #30, 5 RF. -If Linzess  145mcg does not work, then I will increase Linzess  to 290mcg once daily.  I gave patient samples of Linzess  145 and 290 doses to try. - Recommend High Fiber diet with fruits, vegetables, and whole grains. - Drink 64 ounces of Fluids Daily.   2.  Family history of colon cancer (mother age 53s) - 5-year repeat colonoscopy will be due 09/2024.  3.  History of CVA with hemiaplegia;  currently on Plavix ; Stable.  Follow up in 2 - 3 months for Constipation and schedule repeat Colonoscopy.  Ellouise Console, PA-C

## 2024-05-30 ENCOUNTER — Ambulatory Visit (INDEPENDENT_AMBULATORY_CARE_PROVIDER_SITE_OTHER): Payer: PRIVATE HEALTH INSURANCE | Admitting: Physician Assistant

## 2024-05-30 ENCOUNTER — Other Ambulatory Visit (HOSPITAL_BASED_OUTPATIENT_CLINIC_OR_DEPARTMENT_OTHER): Payer: Self-pay

## 2024-05-30 ENCOUNTER — Encounter: Payer: Self-pay | Admitting: Physician Assistant

## 2024-05-30 VITALS — BP 122/86 | HR 72 | Ht <= 58 in | Wt 154.2 lb

## 2024-05-30 DIAGNOSIS — K5900 Constipation, unspecified: Secondary | ICD-10-CM

## 2024-05-30 DIAGNOSIS — K581 Irritable bowel syndrome with constipation: Secondary | ICD-10-CM

## 2024-05-30 DIAGNOSIS — Z800 Family history of malignant neoplasm of digestive organs: Secondary | ICD-10-CM | POA: Diagnosis not present

## 2024-05-30 DIAGNOSIS — K5904 Chronic idiopathic constipation: Secondary | ICD-10-CM

## 2024-05-30 MED ORDER — LINACLOTIDE 145 MCG PO CAPS
145.0000 ug | ORAL_CAPSULE | Freq: Every day | ORAL | 5 refills | Status: DC
  Filled 2024-05-30: qty 30, 30d supply, fill #0

## 2024-05-30 NOTE — Progress Notes (Signed)
 Agree with assessment and plan as outlined.

## 2024-05-30 NOTE — Patient Instructions (Addendum)
 We have sent the following medications to your pharmacy for you to pick up at your convenience: Linzess  145mcg daily before breakfast  We have given you samples of the following medication to take: Linzess  and Linzess  take as directed.  Send a MyChart message letting us  know if the Linzess  is helping you.  For Constipation:  Stop Linzess  72mcg Dose.  Start Rx Linzess  145mcg 1 tablet once daily before a meal.  If Linzess  does not work, then let me know and we will increase  Linzess  to 290mcg once daily.  Also Continue Miralax  1 capful once daily in a drink.  - Recommend High Fiber diet with fruits, vegetables, and whole grains. - Drink 64 ounces of Fluids Daily.   Please follow up sooner if symptoms increase or worsen  Thank you for trusting me with your gastrointestinal care!   Brigitte Canard, PA-C  Due to recent changes in healthcare laws, you may see the results of your imaging and laboratory studies on MyChart before your provider has had a chance to review them.  We understand that in some cases there may be results that are confusing or concerning to you. Not all laboratory results come back in the same time frame and the provider may be waiting for multiple results in order to interpret others.  Please give us  48 hours in order for your provider to thoroughly review all the results before contacting the office for clarification of your results.  _______________________________________________________  If your blood pressure at your visit was 140/90 or greater, please contact your primary care physician to follow up on this.  _______________________________________________________  If you are age 51 or older, your body mass index should be between 23-30. Your Body mass index is 32.24 kg/m. If this is out of the aforementioned range listed, please consider follow up with your Primary Care Provider.  If you are age 51 or younger, your body mass index should  be between 19-25. Your Body mass index is 32.24 kg/m. If this is out of the aformentioned range listed, please consider follow up with your Primary Care Provider.   ________________________________________________________  The [facility] providers would like to encourage you to use MYCHART to communicate with providers for non-urgent requests or questions.  Due to long hold times on the telephone, sending your provider a message by [facility] may be a faster and more efficient way to get a response.  Please allow 48 business hours for a response.  Please remember that this is for non-urgent requests.  _______________________________________________________

## 2024-06-02 ENCOUNTER — Encounter
Payer: PRIVATE HEALTH INSURANCE | Attending: Physical Medicine & Rehabilitation | Admitting: Physical Medicine & Rehabilitation

## 2024-06-02 ENCOUNTER — Encounter: Payer: Self-pay | Admitting: Physical Medicine & Rehabilitation

## 2024-06-02 ENCOUNTER — Other Ambulatory Visit (HOSPITAL_BASED_OUTPATIENT_CLINIC_OR_DEPARTMENT_OTHER): Payer: Self-pay

## 2024-06-02 VITALS — BP 126/85 | HR 76 | Ht <= 58 in | Wt 148.0 lb

## 2024-06-02 DIAGNOSIS — I69154 Hemiplegia and hemiparesis following nontraumatic intracerebral hemorrhage affecting left non-dominant side: Secondary | ICD-10-CM | POA: Diagnosis present

## 2024-06-02 MED ORDER — PREGABALIN 50 MG PO CAPS
50.0000 mg | ORAL_CAPSULE | Freq: Two times a day (BID) | ORAL | 1 refills | Status: DC
  Filled 2024-06-02: qty 60, 30d supply, fill #0
  Filled 2024-07-23: qty 60, 30d supply, fill #1

## 2024-06-02 NOTE — Patient Instructions (Signed)
 Pregabalin Capsules What is this medication? PREGABALIN (pre GAB a lin) treats nerve pain. It may also be used to prevent and control seizures in people with epilepsy. It works by calming overactive nerves in your body. This medicine may be used for other purposes; ask your health care provider or pharmacist if you have questions. COMMON BRAND NAME(S): Lyrica What should I tell my care team before I take this medication? They need to know if you have any of these conditions: Heart failure Kidney disease Lung disease Substance use disorder Suicidal thoughts, plans or attempt by you or a family member An unusual or allergic reaction to pregabalin, other medications, foods, dyes, or preservatives Pregnant or trying to get pregnant Breast-feeding How should I use this medication? Take this medication by mouth with water. Take it as directed on the prescription label at the same time every day. You can take it with or without food. If it upsets your stomach, take it with food. Keep taking it unless your care team tells you to stop. A special MedGuide will be given to you by the pharmacist with each prescription and refill. Be sure to read this information carefully each time. Talk to your care team about the use of this medication in children. While it may be prescribed for children as young as 1 month for selected conditions, precautions do apply. Overdosage: If you think you have taken too much of this medicine contact a poison control center or emergency room at once. NOTE: This medicine is only for you. Do not share this medicine with others. What if I miss a dose? If you miss a dose, take it as soon as you can. If it is almost time for your next dose, take only that dose. Do not take double or extra doses. What may interact with this medication? This medication may interact with the following: Alcohol Antihistamines for allergy, cough, and cold Certain medications for anxiety or  sleep Certain medications for blood pressure, heart disease Certain medications for depression like amitriptyline, fluoxetine, sertraline Certain medications for diabetes, like pioglitazone, rosiglitazone Certain medications for seizures like phenobarbital, primidone General anesthetics like halothane, isoflurane, methoxyflurane, propofol Medications that relax muscles for surgery Opioid medications for pain Phenothiazines like chlorpromazine, mesoridazine, prochlorperazine, thioridazine This list may not describe all possible interactions. Give your health care provider a list of all the medicines, herbs, non-prescription drugs, or dietary supplements you use. Also tell them if you smoke, drink alcohol, or use illegal drugs. Some items may interact with your medicine. What should I watch for while using this medication? Visit your care team for regular checks on your progress. Tell your care team if your symptoms do not start to get better or if they get worse. Do not suddenly stop taking this medication. You may develop a severe reaction. Your care team will tell you how much medication to take. If your care team wants you to stop the medication, the dose may be slowly lowered over time to avoid any side effects. This medication may affect your coordination, reaction time, or judgment. Do not drive or operate machinery until you know how this medication affects you. Sit up or stand slowly to reduce the risk of dizzy or fainting spells. Drinking alcohol with this medication can increase the risk of these side effects. If you or your family notice any changes in your behavior, such as new or worsening depression, thoughts of harming yourself, anxiety, other unusual or disturbing thoughts, or memory loss, call your  care team right away. Wear a medical ID bracelet or chain if you are taking this medication for seizures. Carry a card that describes your condition. List the medications and doses you take  on the card. This medication may make it more difficult to father a child. Talk to your care team if you are concerned about your fertility. What side effects may I notice from receiving this medication? Side effects that you should report to your care team as soon as possible: Allergic reactions or angioedema--skin rash, itching, hives, swelling of the face, eyes, lips, tongue, arms, or legs, trouble swallowing or breathing Blurry vision Thoughts of suicide or self-harm, worsening mood, feelings of depression Trouble breathing Side effects that usually do not require medical attention (report to your care team if they continue or are bothersome): Dizziness Drowsiness Dry mouth Nausea Swelling of the ankles, feet, hands Vomiting Weight gain This list may not describe all possible side effects. Call your doctor for medical advice about side effects. You may report side effects to FDA at 1-800-FDA-1088. Where should I keep my medication? Keep out of the reach of children and pets. This medication can be abused. Keep it in a safe place to protect it from theft. Do not share it with anyone. It is only for you. Selling or giving away this medication is dangerous and against the law. Store at [employer] [address] (77 degrees F). Get rid of any unused medication after the expiration date. This medication may cause harm and death if it is taken by other adults, children, or pets. It is important to get rid of the medication as soon as you no longer need it, or it is expired. You can do this in two ways: Take the medication to a medication take-back program. Check with your pharmacy or law enforcement to find a location. If you cannot return the medication, check the label or package insert to see if the medication should be thrown out in the garbage or flushed down the toilet. If you are not sure, ask your care team. If it is safe to put it in the trash, take the medication out of the container. Mix the  medication with cat litter, dirt, coffee grounds, or other unwanted substance. Seal the mixture in a bag or container. Put it in the trash. NOTE: This sheet is a summary. It may not cover all possible information. If you have questions about this medicine, talk to your doctor, pharmacist, or health care provider.  2024 Elsevier/Gold Standard (2021-09-02 00:00:00)

## 2024-06-02 NOTE — Progress Notes (Signed)
 Subjective:    Patient ID: Abigail Campos, female    DOB: 1971/08/03, 53 y.o.   MRN: [id number]  Aphasia mainly expressive Xeomin  Injection for spasticity using needle EMG guidance   Dilution: 200 Units/ml Indication: Severe spasticity which interferes with ADL,mobility and/or  hygiene and is unresponsive to medication management and other conservative care Informed consent was obtained after describing risks and benefits of the procedure with the patient. This includes bleeding, bruising, infection, excessive weakness, or medication side effects. A REMS form is on file and signed. Needle:  needle electrode Number of units per muscle 200 units total Left FCR 50 units Left FDS 50 units Left FDP 50 units Left FPL 25 units  Left FCU 25 HPI The xeomin  injection about 6 weeks ago as well as a Left tibial nerve block with phenol The patient states that her arm feels better but her leg feels about the same.  She is hoping the leg injection would help with her toe pain.  She mainly has little toe pain.  She denies any injury Her history is limited by her aphasia  Pain Inventory Average Pain 3 Pain Right Now 6 My pain is intermittent and sharp  In the last 24 hours, has pain interfered with the following? General activity 3 Relation with others 3 Enjoyment of life 3 What TIME of day is your pain at its worst? night Sleep (in general) Poor  Pain is worse with: walking Pain improves with: rest and medication Relief from Meds: 3  Family History  Problem Relation Age of Onset   Diabetes Mother    Hypertension Mother    Glaucoma Mother    Colon cancer Mother        mother age 4   Anxiety disorder Mother    Obesity Mother    Rectal cancer Father        dx 20   Hypertension Father    Sleep apnea Father    Alcoholism Father    Obesity Father    Prostate cancer Father    Kidney cancer Father    COPD Father    Heart failure Father    Diabetes Brother    Hypertension  Brother    Diabetes Brother    Hypertension Brother    CAD Maternal Grandmother        MI age 60   Stroke Maternal Grandfather    Stroke Paternal Grandfather    Heart disease Maternal Aunt        x 2 did 2012 CAD-CHF   Lung cancer Maternal Aunt    CAD Cousin        cousind w/ CAD   Breast cancer Neg Hx    Esophageal cancer Neg Hx    Stomach cancer Neg Hx    Social History   Socioeconomic History   Marital status: Married    Spouse name: Marijean Shouts   Number of children: 2   Years of education: Not on file   Highest education level: Associate degree: occupational, Scientist, product/process development, or vocational program  Occupational History   Occupation: disable d/t stroke TEACHER    Employer: [employer]  Tobacco Use   Smoking status: Never   Smokeless tobacco: Never  Vaping Use   Vaping status: Never Used  Substance and Sexual Activity   Alcohol  use: No   Drug use: No   Sexual activity: Not Currently    Birth control/protection: Surgical  Other Topics Concern   Not on file  Social History Narrative  Lives with husband and son (2007)   Left Hand prior to stroke   Drinks 1-2 cups daily   Daughter (1998), married    Son 2008   Social Drivers of Health   Financial Resource Strain: Low Risk  (04/25/2024)   Overall Financial Resource Strain (CARDIA)    Difficulty of Paying Living Expenses: Not hard at all  Recent Concern: Financial Resource Strain - Medium Risk (02/16/2024)   Overall Financial Resource Strain (CARDIA)    Difficulty of Paying Living Expenses: Somewhat hard  Food Insecurity: No Food Insecurity (04/25/2024)   Hunger Vital Sign    Worried About Running Out of Food in the Last Year: Never true    Ran Out of Food in the Last Year: Never true  Recent Concern: Food Insecurity - Food Insecurity Present (02/16/2024)   Hunger Vital Sign    Worried About Running Out of Food in the Last Year: Sometimes true    Ran Out of Food in the Last Year: Often true  Transportation Needs: No  Transportation Needs (04/25/2024)   PRAPARE - Administrator, Civil Service (Medical): No    Lack of Transportation (Non-Medical): No  Physical Activity: Sufficiently Active (04/25/2024)   Exercise Vital Sign    Days of Exercise per Week: 5 days    Minutes of Exercise per Session: 30 min  Recent Concern: Physical Activity - Insufficiently Active (02/16/2024)   Exercise Vital Sign    Days of Exercise per Week: 3 days    Minutes of Exercise per Session: 10 min  Stress: No Stress Concern Present (04/25/2024)   [employer] of Occupational Health - Occupational Stress Questionnaire    Feeling of Stress : Not at all  Recent Concern: Stress - Stress Concern Present (02/16/2024)   [employer] of Occupational Health - Occupational Stress Questionnaire    Feeling of Stress : To some extent  Social Connections: Socially Integrated (04/25/2024)   Social Connection and Isolation Panel    Frequency of Communication with Friends and Family: More than three times a week    Frequency of Social Gatherings with Friends and Family: More than three times a week    Attends Religious Services: More than 4 times per year    Active Member of Clubs or Organizations: Yes    Attends Engineer, structural: More than 4 times per year    Marital Status: Married   Past Surgical History:  Procedure Laterality Date   ABDOMINAL HYSTERECTOMY  11/14/2007   no oophorectomy per surgical report   BREAST BIOPSY Right 02/08/2023   MM RT BREAST BX W LOC DEV 1ST LESION IMAGE BX SPEC STEREO GUIDE 02/08/2023 [facility]   CESAREAN SECTION     x [phone]   UMBILICAL HERNIA REPAIR     2004   Past Surgical History:  Procedure Laterality Date   ABDOMINAL HYSTERECTOMY  11/14/2007   no oophorectomy per surgical report   BREAST BIOPSY Right 02/08/2023   MM RT BREAST BX W LOC DEV 1ST LESION IMAGE BX SPEC STEREO GUIDE 02/08/2023 [facility]   CESAREAN SECTION     x [phone]    UMBILICAL HERNIA REPAIR     2004   Past Medical History:  Diagnosis Date   Allergy    Anemia    Anxiety and depression    Arthritis    Back pain    Blood transfusion without reported diagnosis    Depression    DM (diabetes mellitus) (HCC)  Essential hypertension 10/19/2007   03-2010: metoprolol  changed to bystolic  (was not feeling well on it, no specific allergy or reaction)     Food allergy    Hemiplegia (HCC)    Hyperlipemia    Hypertension    Joint pain    Migraine    Neuromuscular disorder (HCC)    left sided hemiplegia - has brace for l leg and walks with a cane   Obesity    Ovarian cancer (HCC)    Pneumonia    Prediabetes    Sleep apnea    not wearing c-pap yet   Stroke (HCC) 2017   Vitamin D  deficiency    BP 126/85   Pulse 76   Ht 4' 10 (1.473 m)   Wt 148 lb (67.1 kg)   SpO2 98%   BMI 30.93 kg/m   Opioid Risk Score:   Fall Risk Score:  `1  Depression screen [facility] 2/9     06/02/2024   11:55 AM 04/25/2024   11:02 AM 04/17/2024    3:26 PM 04/13/2024   11:47 AM 02/21/2024   11:05 AM 01/13/2024   10:09 AM 10/06/2023    1:20 PM  Depression screen PHQ 2/9  Decreased Interest 0 0 0 0 0 0 0  Down, Depressed, Hopeless 0 0 0 0 0 0 0  PHQ - 2 Score 0 0 0 0 0 0 0  Altered sleeping  0 1  0  0  Tired, decreased energy  0 1  0  0  Change in appetite  0 0  0  0  Feeling bad or failure about yourself   0 0  0  0  Trouble concentrating  0 1  0  0  Moving slowly or fidgety/restless  0 0  0  0  Suicidal thoughts  0 0  0  0  PHQ-9 Score  0 3  0  0     Review of Systems  Musculoskeletal:  Positive for gait problem.       Left arm and leg and ankle  All other systems reviewed and are negative.      Objective:   Physical Exam  The patient can feel light touch and temperature left upper extremity Left lower extremity absent light touch sensation Tone left upper extremity MAS 3 at the finger flexors MAS 2 at the wrist flexors MAS 1 at the elbow flexors Left lower  extremity MAS 1 at the ankle plantar flexors as well as foot inverters. No clonus at the left ankle. MSK The left foot without swelling there is no pain with metatarsal compression.  No pain with toe range of motion.  There is no deformity of any of the toes.  No skin lesions No calf tenderness No hypersensitivity to touch in the lower extremity General no acute distress Mood and affect appropriate Ambulates with her left AFO as well as a cane she does have some circumduction gait but she is able to clear her toes.  No evidence of knee instability      Assessment & Plan:   1.  Left spastic hemiplegia some improvement in left upper extremity tone and left lower extremity tone status post botulinum toxin injection to the left upper extremity and female nerve block to the left lower extremity 2.  Left small toe pain does not appear to be musculoskeletal her exam is normal of her entire foot.  Her tone has improved in the left lower extremity but she does have persistent  neurogenic pain from the stroke itself she has altered sensation.  Will trial her on pregabalin 50 mg twice daily and see how she is doing in 6 weeks during the next visit Will repeat botulinum toxin injection left upper extremity in 6 weeks. Would not plan to repeat phenol injection until 6 months post previous injection.

## 2024-06-07 ENCOUNTER — Other Ambulatory Visit (HOSPITAL_BASED_OUTPATIENT_CLINIC_OR_DEPARTMENT_OTHER): Payer: Self-pay

## 2024-06-09 ENCOUNTER — Encounter: Payer: Self-pay | Admitting: Internal Medicine

## 2024-06-21 ENCOUNTER — Other Ambulatory Visit (HOSPITAL_BASED_OUTPATIENT_CLINIC_OR_DEPARTMENT_OTHER): Payer: Self-pay

## 2024-06-21 ENCOUNTER — Encounter: Payer: Self-pay | Admitting: Internal Medicine

## 2024-06-21 ENCOUNTER — Ambulatory Visit (INDEPENDENT_AMBULATORY_CARE_PROVIDER_SITE_OTHER): Payer: PRIVATE HEALTH INSURANCE | Admitting: Internal Medicine

## 2024-06-21 VITALS — BP 126/78 | HR 62 | Temp 98.0°F | Resp 16 | Ht <= 58 in | Wt 147.0 lb

## 2024-06-21 DIAGNOSIS — H9191 Unspecified hearing loss, right ear: Secondary | ICD-10-CM | POA: Diagnosis not present

## 2024-06-21 DIAGNOSIS — H9311 Tinnitus, right ear: Secondary | ICD-10-CM | POA: Diagnosis not present

## 2024-06-21 NOTE — Progress Notes (Unsigned)
 Subjective:    Patient ID: Abigail Campos, female    DOB: 01-May-1971, 53 y.o.   MRN: [id number]  DOS:  06/21/2024 Type of visit - description: Acute  Chief complaint is tinnitus, this started in 2017, gradually worse.  She also feels that her hearing is decreasing on that side.  Also complaining of runny nose and left watery eye. Denies any recent URI. Vision is normal Denies sneezing or itchy eyes.   Review of Systems See above   Past Medical History:  Diagnosis Date   Allergy    Anemia    Anxiety and depression    Arthritis    Back pain    Blood transfusion without reported diagnosis    Depression    DM (diabetes mellitus) (HCC)    Essential hypertension 10/19/2007   03-2010: metoprolol  changed to bystolic  (was not feeling well on it, no specific allergy or reaction)     Food allergy    Hemiplegia (HCC)    Hyperlipemia    Hypertension    Joint pain    Migraine    Neuromuscular disorder (HCC)    left sided hemiplegia - has brace for l leg and walks with a cane   Obesity    Ovarian cancer (HCC)    Pneumonia    Prediabetes    Sleep apnea    not wearing c-pap yet   Stroke (HCC) 2017   Vitamin D  deficiency     Past Surgical History:  Procedure Laterality Date   ABDOMINAL HYSTERECTOMY  11/14/2007   no oophorectomy per surgical report   BREAST BIOPSY Right 02/08/2023   MM RT BREAST BX W LOC DEV 1ST LESION IMAGE BX SPEC STEREO GUIDE 02/08/2023 [facility]   CESAREAN SECTION     x [phone]   UMBILICAL HERNIA REPAIR     2004    Current Outpatient Medications  Medication Instructions   alendronate  (FOSAMAX ) 70 MG tablet Take 1 tablet (70 mg total) by mouth once a week with a full glass of water on an empty stomach.   atorvastatin  (LIPITOR) 20 mg, Oral, Daily   baclofen  (LIORESAL ) 10 mg, Oral, Daily   buPROPion  (WELLBUTRIN  XL) 150 mg, Oral, Daily   carvedilol  (COREG ) 25 mg, Oral, 2 times daily with meals   clopidogrel  (PLAVIX ) 75 mg, Oral, Daily    escitalopram  (LEXAPRO ) 20 mg, Oral, Daily   linaclotide  (LINZESS ) 145 mcg, Oral, Daily before breakfast   losartan  (COZAAR ) 100 mg, Oral, Daily   pregabalin  (LYRICA ) 50 mg, Oral, 2 times daily   prochlorperazine  (COMPAZINE ) 10 MG tablet Take by mouth.   tiZANidine  (ZANAFLEX ) 4 mg, Oral, 3 times daily   traZODone  (DESYREL ) 100 mg, Oral, At bedtime PRN   Vitamin D3 50,000 Units, Oral, Weekly   Zepbound  7.5 mg, Weekly       Objective:   Physical Exam BP 126/78   Pulse 62   Temp 98 F (36.7 C) (Oral)   Resp 16   Ht 4' 10 (1.473 m)   Wt 147 lb (66.7 kg)   SpO2 98%   BMI 30.72 kg/m  General:   Well developed, NAD, BMI noted. HEENT:  Normocephalic . Face symmetric, atraumatic EOMI.  Conjunctiva is not injected or watery Ears:  Bilateral TMs normal, canal normal. Hearing grossly intact Nose: Slightly congested Lower extremities: no pretibial edema bilaterally  Skin: Not pale. Not jaundice Neurologic:  alert & oriented X3.  Speech normal, gait ar baseline Psych--   Behavior appropriate. No anxious or  depressed appearing.      Assessment     Assessment   Prediabetes: A1c 6.0 (April 2017)  HTN-- HCTZ increased creatinine (07-2023) Hyperlipidemia Depression, insomnia, anxiety GERD Stroke 03-2016: Sequela (L)  spastic hemiplegia   ICH, right basilar ganglia due to HTN emergency, + encephalopathy, + dysphagia, aphasia, dysarthria Residual L spasticity.  CTA neck 03-2016 neg CTA head 10-17 (-) aneurysm Headaches , migraines dx after 2nd pregnancy, (-) CT head 2014 and 2015 OSA: Sleep study 08-2019, saw Dr. Neysa, intolerant to CPAP  Osteoporosis.  T-score 04/2023 -3.2.  Rx Fosamax  Vitamin D  deficiency FH CAD: Myoview  09/2023 (-)  PLAN R Tinnitus, decreased hearing: SXS started 2017 (had a stroke), getting gradually worse, no obvious decreased hearing on today's exam however given unilateral tinnitus will refer to ENT. Allergies: Runny nose, left watery eye, exam  benign, rec consistent use of Flonase , call if not gradually better

## 2024-06-22 NOTE — Assessment & Plan Note (Signed)
 R Tinnitus, decreased hearing: SXS started 2017 (had a stroke), getting gradually worse, no obvious decreased hearing on today's exam however given unilateral tinnitus will refer to ENT. Allergies: Runny nose, left watery eye, exam benign, rec consistent use of Flonase , call if not gradually better

## 2024-06-27 ENCOUNTER — Other Ambulatory Visit (HOSPITAL_BASED_OUTPATIENT_CLINIC_OR_DEPARTMENT_OTHER): Payer: Self-pay

## 2024-06-27 ENCOUNTER — Other Ambulatory Visit: Payer: Self-pay | Admitting: Internal Medicine

## 2024-06-27 MED ORDER — ESCITALOPRAM OXALATE 20 MG PO TABS
20.0000 mg | ORAL_TABLET | Freq: Every day | ORAL | 1 refills | Status: AC
  Filled 2024-06-27: qty 30, 30d supply, fill #0
  Filled 2024-07-23: qty 90, 90d supply, fill #0
  Filled 2024-10-20: qty 90, 90d supply, fill #1

## 2024-07-10 ENCOUNTER — Other Ambulatory Visit (HOSPITAL_BASED_OUTPATIENT_CLINIC_OR_DEPARTMENT_OTHER): Payer: Self-pay

## 2024-07-14 ENCOUNTER — Encounter: Payer: Self-pay | Admitting: Physical Medicine & Rehabilitation

## 2024-07-14 ENCOUNTER — Encounter
Payer: PRIVATE HEALTH INSURANCE | Attending: Physical Medicine & Rehabilitation | Admitting: Physical Medicine & Rehabilitation

## 2024-07-14 VITALS — BP 118/88 | HR 85 | Ht <= 58 in | Wt 145.0 lb

## 2024-07-14 DIAGNOSIS — I69154 Hemiplegia and hemiparesis following nontraumatic intracerebral hemorrhage affecting left non-dominant side: Secondary | ICD-10-CM | POA: Insufficient documentation

## 2024-07-14 MED ORDER — INCOBOTULINUMTOXINA 100 UNITS IM SOLR
200.0000 [IU] | Freq: Once | INTRAMUSCULAR | Status: AC
  Administered 2024-07-14: 200 [IU] via INTRAMUSCULAR

## 2024-07-14 MED ORDER — SODIUM CHLORIDE (PF) 0.9 % IJ SOLN
4.0000 mL | Freq: Once | INTRAMUSCULAR | Status: AC
  Administered 2024-07-14: 4 mL via INTRAVENOUS

## 2024-07-14 NOTE — Progress Notes (Signed)
 Xeomin  Injection for spasticity using needle EMG guidance  Dilution: 200 Units/ml Indication: Severe spasticity which interferes with ADL,mobility and/or  hygiene and is unresponsive to medication management and other conservative care Informed consent was obtained after describing risks and benefits of the procedure with the patient. This includes bleeding, bruising, infection, excessive weakness, or medication side effects. A REMS form is on file and signed. Needle:  needle electrode Number of units per muscle 200 units total Left FCR 50 units Left FDS 50 units Left FDP 50 units Left FPL 50 units  All injections were done after obtaining appropriate EMG activity and after negative drawback for blood. The patient tolerated the procedure well. Post procedure instructions were given. A followup appointment was made.   Post procedure instructions and followup visit were given.  Discussed repeating tibial nerve block with phenol in 6 to 7 weeks left lower limb

## 2024-07-23 ENCOUNTER — Other Ambulatory Visit (HOSPITAL_BASED_OUTPATIENT_CLINIC_OR_DEPARTMENT_OTHER): Payer: Self-pay

## 2024-07-24 ENCOUNTER — Other Ambulatory Visit (HOSPITAL_BASED_OUTPATIENT_CLINIC_OR_DEPARTMENT_OTHER): Payer: Self-pay

## 2024-07-24 ENCOUNTER — Other Ambulatory Visit: Payer: Self-pay

## 2024-07-25 ENCOUNTER — Other Ambulatory Visit (HOSPITAL_BASED_OUTPATIENT_CLINIC_OR_DEPARTMENT_OTHER): Payer: Self-pay

## 2024-07-27 ENCOUNTER — Other Ambulatory Visit (HOSPITAL_BASED_OUTPATIENT_CLINIC_OR_DEPARTMENT_OTHER): Payer: Self-pay

## 2024-07-27 MED ORDER — BUPROPION HCL ER (XL) 150 MG PO TB24
150.0000 mg | ORAL_TABLET | Freq: Every day | ORAL | 2 refills | Status: DC
  Filled 2024-09-24: qty 30, 30d supply, fill #0
  Filled 2024-10-20: qty 30, 30d supply, fill #1
  Filled 2024-11-29: qty 30, 30d supply, fill #2

## 2024-08-04 ENCOUNTER — Ambulatory Visit (INDEPENDENT_AMBULATORY_CARE_PROVIDER_SITE_OTHER): Payer: PRIVATE HEALTH INSURANCE | Admitting: Audiology

## 2024-08-04 DIAGNOSIS — Z0110 Encounter for examination of ears and hearing without abnormal findings: Secondary | ICD-10-CM

## 2024-08-04 DIAGNOSIS — H93291 Other abnormal auditory perceptions, right ear: Secondary | ICD-10-CM

## 2024-08-04 NOTE — Progress Notes (Signed)
  [address], [address] [place], [place] [address] [phone]  Audiological Evaluation    Name: Abigail Campos     DOB:   1971-09-19      MRN:   [id number]                                                                                     Service Date: 08/04/2024     Accompanied by: unaccompanied   Patient comes today after Reyes Cohen, PA-C sent a referral for a hearing evaluation due to concerns with tinnitus.   Symptoms Yes Details  Hearing loss  [x]  Perceives that the tinnitus affects her hearing  Tinnitus  [x]  Onset was 9 years ago after she started a spasm medication ( after she had a stroke). Says that she doe snot have it when she doe snot take the medication. Reports to be bothersome at night , but she is sleeping well now when she uses a fan.  Ear pain/ infections/pressure  []    Balance problems  []    Noise exposure history  []    Previous ear surgeries  []    Family history of hearing loss  []    Amplification  []    Other  []      Otoscopy: Right ear: Clear external ear canal and notable landmarks visualized on the tympanic membrane. Left ear:  Clear external ear canal and notable landmarks visualized on the tympanic membrane.  Tympanometry: Right ear: Type A- Normal external ear canal volume with normal middle ear pressure and tympanic membrane compliance. Left ear: Type A- Normal external ear canal volume with normal middle ear pressure and tympanic membrane compliance.    Pure tone Audiometry: Both ears - Normal hearing from [phone] Hz.  Speech Audiometry: Right ear- Speech Reception Threshold (SRT) was obtained at 10 dBHL. Left ear-Speech Reception Threshold (SRT) was obtained at 10 dBHL.   Word Recognition Score Tested using NU-6 (recorded) Right ear: 100% was obtained at a presentation level of 50 dBHL with contralateral masking which is deemed as  excellent. Left ear: 100% was obtained at a presentation level of 50 dBHL with contralateral  masking which is deemed as  excellent.   The hearing test results were completed under headphones and results are deemed to be of good reliability. Test technique:  conventional      Recommendations: Follow up with ENT as scheduled for today. Return for a hearing evaluation if concerns with hearing changes arise or per MD recommendation.   Derick Seminara MARIE LEROUX-MARTINEZ, AUD

## 2024-08-07 ENCOUNTER — Ambulatory Visit (INDEPENDENT_AMBULATORY_CARE_PROVIDER_SITE_OTHER): Payer: PRIVATE HEALTH INSURANCE | Admitting: Physician Assistant

## 2024-08-07 ENCOUNTER — Encounter (INDEPENDENT_AMBULATORY_CARE_PROVIDER_SITE_OTHER): Payer: Self-pay | Admitting: Physician Assistant

## 2024-08-07 VITALS — BP 124/84 | HR 72

## 2024-08-07 DIAGNOSIS — H9391 Unspecified disorder of right ear: Secondary | ICD-10-CM

## 2024-08-07 DIAGNOSIS — H93291 Other abnormal auditory perceptions, right ear: Secondary | ICD-10-CM

## 2024-08-07 NOTE — Progress Notes (Signed)
 Dear Dr. Amon, Here is my assessment for our mutual patient, Copelyn Widmer. Thank you for allowing me the opportunity to care for your patient. Please do not hesitate to contact me should you have any other questions. Sincerely, Chyrl Cohen PA-C  Otolaryngology Clinic Note Referring provider: Dr. Amon HPI:  Danyia Borunda is a 53 y.o. female kindly referred by Dr. Amon   The patient is a 53 year old female seen in our office for evaluation of tinnitus.  The patient notes that approximately 9 years ago she had a hemorrhagic stroke.  She notes that approximately 4 months after having the stroke she developed right sided tinnitus.  The patient has difficulty grabbing the tinnitus, when I attempted to characterize it she was unable to elaborate what she was hearing and kept referring to it as feeling clogged.  She notes this is intermittent, its only right sided, she denies any pulsatile nature or pain.  No associated dizziness.  She notes she started baclofen  for muscle spasms during hospitalization and was told that this was likely the cause of the tinnitus.  She denies any significant head trauma other than the intracranial hemorrhage, she denies any significant noise exposure.  No history recurrent ear infections.  She feels like her hearing is decreased on the right when compared to left       Independent Review of Additional Tests or Records:  Audiological evaluation on 08/07/2024     Normal audiological evaluation  PMH/Meds/All/SocHx/FamHx/ROS:   Past Medical History:  Diagnosis Date   Allergy    Anemia    Anxiety and depression    Arthritis    Back pain    Blood transfusion without reported diagnosis    Depression    DM (diabetes mellitus) (HCC)    Essential hypertension 10/19/2007   03-2010: metoprolol  changed to bystolic  (was not feeling well on it, no specific allergy or reaction)     Food allergy    Hemiplegia (HCC)    Hyperlipemia    Hypertension    Joint pain     Migraine    Neuromuscular disorder (HCC)    left sided hemiplegia - has brace for l leg and walks with a cane   Obesity    Ovarian cancer (HCC)    Pneumonia    Prediabetes    Sleep apnea    not wearing c-pap yet   Stroke (HCC) 2017   Vitamin D  deficiency      Past Surgical History:  Procedure Laterality Date   ABDOMINAL HYSTERECTOMY  11/14/2007   no oophorectomy per surgical report   BREAST BIOPSY Right 02/08/2023   MM RT BREAST BX W LOC DEV 1ST LESION IMAGE BX SPEC STEREO GUIDE 02/08/2023 [facility]   CESAREAN SECTION     x [phone],[id number]   UMBILICAL HERNIA REPAIR     2004    Family History  Problem Relation Age of Onset   Diabetes Mother    Hypertension Mother    Glaucoma Mother    Colon cancer Mother        mother age 4   Anxiety disorder Mother    Obesity Mother    Rectal cancer Father        dx 90   Hypertension Father    Sleep apnea Father    Alcoholism Father    Obesity Father    Prostate cancer Father    Kidney cancer Father    COPD Father    Heart failure Father    Diabetes Brother  Hypertension Brother    Diabetes Brother    Hypertension Brother    CAD Maternal Grandmother        MI age 68   Stroke Maternal Grandfather    Stroke Paternal Grandfather    Heart disease Maternal Aunt        x 2 did 2012 CAD-CHF   Lung cancer Maternal Aunt    CAD Cousin        cousind w/ CAD   Breast cancer Neg Hx    Esophageal cancer Neg Hx    Stomach cancer Neg Hx      Social Connections: Unknown (06/20/2024)   Social Connection and Isolation Panel    Frequency of Communication with Friends and Family: Twice a week    Frequency of Social Gatherings with Friends and Family: Once a week    Attends Religious Services: More than 4 times per year    Active Member of [employer] or Organizations: Yes    Attends Banker Meetings: Not on file    Marital Status: Not on file      Current Outpatient Medications:    alendronate  (FOSAMAX ) 70 MG tablet,  Take 1 tablet (70 mg total) by mouth once a week with a full glass of water on an empty stomach., Disp: 4 tablet, Rfl: 11   atorvastatin  (LIPITOR) 20 MG tablet, Take 1 tablet (20 mg total) by mouth daily., Disp: 90 tablet, Rfl: 1   baclofen  (LIORESAL ) 10 MG tablet, Take 1 tablet (10 mg total) by mouth daily., Disp: 30 each, Rfl: 11   buPROPion  (WELLBUTRIN  XL) 150 MG 24 hr tablet, Take 1 tablet by mouth once daily., Disp: 30 tablet, Rfl: 2   carvedilol  (COREG ) 25 MG tablet, Take 1 tablet (25 mg total) by mouth 2 (two) times daily with a meal., Disp: 180 tablet, Rfl: 1   Cholecalciferol  1.25 MG (50000 UT) capsule, Take 1 capsule (50,000 Units total) by mouth once a week., Disp: 12 capsule, Rfl: 3   clopidogrel  (PLAVIX ) 75 MG tablet, Take 1 tablet (75 mg total) by mouth daily., Disp: 90 tablet, Rfl: 1   escitalopram  (LEXAPRO ) 20 MG tablet, Take 1 tablet (20 mg total) by mouth daily., Disp: 90 tablet, Rfl: 1   linaclotide  (LINZESS ) 145 MCG CAPS capsule, Take 1 capsule (145 mcg total) by mouth daily before breakfast., Disp: 30 capsule, Rfl: 5   losartan  (COZAAR ) 100 MG tablet, Take 1 tablet (100 mg total) by mouth daily., Disp: 90 tablet, Rfl: 1   pregabalin  (LYRICA ) 50 MG capsule, Take 1 capsule (50 mg total) by mouth 2 (two) times daily., Disp: 60 capsule, Rfl: 1   prochlorperazine  (COMPAZINE ) 10 MG tablet, Take by mouth., Disp: , Rfl:    tirzepatide  (ZEPBOUND ) 7.5 MG/0.5ML Pen, Inject 7.5 mg into the skin once a week., Disp: , Rfl:    tiZANidine  (ZANAFLEX ) 2 MG tablet, Take 2 tablets (4 mg total) by mouth 3 (three) times daily., Disp: 810 tablet, Rfl: 1   traZODone  (DESYREL ) 100 MG tablet, Take 1 tablet (100 mg total) by mouth at bedtime as needed for sleep., Disp: 90 tablet, Rfl: 1  Current Facility-Administered Medications:    incobotulinumtoxinA  (XEOMIN ) 100 units injection 100 Units, 100 Units, Intramuscular, Once,    incobotulinumtoxinA  (XEOMIN ) 100 units injection 200 Units, 200 Units,  Intramuscular, Once,    PHENOL 5% (AQUEOUS) FOR INJECTION 5 mL, 5 mL, Injection, PRN, , 5 mL at 10/12/23 1236   Physical Exam:   BP 124/84   Pulse  72   SpO2 93%   Pertinent Findings  CN II-XII intact Bilateral EAC clear and TM intact with well pneumatized middle ear spaces  Anterior rhinoscopy: Septum midline; bilateral inferior turbinates with no hypertrophy No lesions of oral cavity/oropharynx; dentition within normal limits No obviously palpable neck masses/lymphadenopathy/thyromegaly No respiratory distress or stridor  Seprately Identifiable Procedures:  None  Impression & Plans:  Lania Zawistowski is a 53 y.o. female with the following   Tinnitus-  53 year old female presents today with right sided ear complaints.  She describes this is ringing but has a difficult time actually describing the sound.  I attempted to give her examples but it does not sound as if this is true tinnitus.  She does describe some clogged ear sensation on the right but no signs or symptoms that would be consistent with eustachian tube dysfunction.  I did make sure to identify that there was no pulsatile nature.  Today her evaluation is reassuring with normal physical exam and normal audiological evaluation.  This does not seem to affect her quality of life.  At this time she will continue watching this and return on a as needed basis if any new or worsening signs or symptoms present.   - f/u PRN   Thank you for allowing me the opportunity to care for your patient. Please do not hesitate to contact me should you have any other questions.  Sincerely, Chyrl Cohen PA-C [facility] Phone: [phone] Fax: [phone]  08/07/2024, 10:42 AM

## 2024-08-08 NOTE — Progress Notes (Unsigned)
 Ellouise Console, PA-C [address] [place], [place]  [address] Phone: [phone]   Primary Care Physician: Amon Aloysius BRAVO, MD  Primary Gastroenterologist:  Ellouise Console, PA-C / Elspeth Naval, MD   Chief Complaint: Follow-up constipation and schedule repeat colonoscopy       HPI:   Abigail Campos is a 53 y.o. female returns for 46-month follow-up of chronic constipation and IBS-C.  2 months ago Linzess  was increased to 145 mcg once daily.  She has family history of colon cancer in her mother age 15s.  5-year repeat colonoscopy will be due 09/2024.  09/2019 last colonoscopy by Dr. Naval: Moderate internal hemorrhoids.  No polyps.  Good prep.     PMH: History of CVA with hemiplegia and spastic hemiparesis, sleep apnea, dysphagia, anxiety, depression, neuropathic pain.  Currently on Plavix .  Current Outpatient Medications  Medication Sig Dispense Refill   alendronate  (FOSAMAX ) 70 MG tablet Take 1 tablet (70 mg total) by mouth once a week with a full glass of water on an empty stomach. 4 tablet 11   atorvastatin  (LIPITOR) 20 MG tablet Take 1 tablet (20 mg total) by mouth daily. 90 tablet 1   baclofen  (LIORESAL ) 10 MG tablet Take 1 tablet (10 mg total) by mouth daily. 30 each 11   buPROPion  (WELLBUTRIN  XL) 150 MG 24 hr tablet Take 1 tablet by mouth once daily. 30 tablet 2   carvedilol  (COREG ) 25 MG tablet Take 1 tablet (25 mg total) by mouth 2 (two) times daily with a meal. 180 tablet 1   Cholecalciferol  1.25 MG (50000 UT) capsule Take 1 capsule (50,000 Units total) by mouth once a week. 12 capsule 3   clopidogrel  (PLAVIX ) 75 MG tablet Take 1 tablet (75 mg total) by mouth daily. 90 tablet 1   escitalopram  (LEXAPRO ) 20 MG tablet Take 1 tablet (20 mg total) by mouth daily. 90 tablet 1   linaclotide  (LINZESS ) 145 MCG CAPS capsule Take 1 capsule (145 mcg total) by mouth daily before breakfast. 30 capsule 5   losartan  (COZAAR ) 100 MG tablet Take 1 tablet (100 mg total) by  mouth daily. 90 tablet 1   pregabalin  (LYRICA ) 50 MG capsule Take 1 capsule (50 mg total) by mouth 2 (two) times daily. 60 capsule 1   prochlorperazine  (COMPAZINE ) 10 MG tablet Take by mouth.     tirzepatide  (ZEPBOUND ) 7.5 MG/0.5ML Pen Inject 7.5 mg into the skin once a week.     tiZANidine  (ZANAFLEX ) 2 MG tablet Take 2 tablets (4 mg total) by mouth 3 (three) times daily. 810 tablet 1   traZODone  (DESYREL ) 100 MG tablet Take 1 tablet (100 mg total) by mouth at bedtime as needed for sleep. 90 tablet 1   Current Facility-Administered Medications  Medication Dose Route Frequency Provider Last Rate Last Admin   incobotulinumtoxinA  (XEOMIN ) 100 units injection 100 Units  100 Units Intramuscular Once        incobotulinumtoxinA  (XEOMIN ) 100 units injection 200 Units  200 Units Intramuscular Once        PHENOL 5% (AQUEOUS) FOR INJECTION 5 mL  5 mL Injection PRN    5 mL at 10/12/23 1236    Allergies as of 08/09/2024 - Review Complete 08/07/2024  Allergen Reaction Noted   Bee venom Anaphylaxis 09/25/2015   Iopamidol  Hives and Itching 10/07/2016   Peanut-containing drug products Anaphylaxis 10/19/2007   Aspirin Other (See Comments) 10/19/2007   Latex Hives 10/13/2011   Zolpidem  tartrate Other (See Comments) and  Hives 03/22/2015   Shingrix  [zoster vac recomb adjuvanted] Other (See Comments) 04/17/2024   Hydrocodone  Itching 08/17/2012    Past Medical History:  Diagnosis Date   Allergy    Anemia    Anxiety and depression    Arthritis    Back pain    Blood transfusion without reported diagnosis    Depression    DM (diabetes mellitus) (HCC)    Essential hypertension 10/19/2007   03-2010: metoprolol  changed to bystolic  (was not feeling well on it, no specific allergy or reaction)     Food allergy    Hemiplegia (HCC)    Hyperlipemia    Hypertension    Joint pain    Migraine    Neuromuscular disorder (HCC)    left sided hemiplegia - has brace for l leg and walks with a cane   Obesity     Ovarian cancer (HCC)    Pneumonia    Prediabetes    Sleep apnea    not wearing c-pap yet   Stroke (HCC) 2017   Vitamin D  deficiency     Past Surgical History:  Procedure Laterality Date   ABDOMINAL HYSTERECTOMY  11/14/2007   no oophorectomy per surgical report   BREAST BIOPSY Right 02/08/2023   MM RT BREAST BX W LOC DEV 1ST LESION IMAGE BX SPEC STEREO GUIDE 02/08/2023 [facility]MO[facility]   CESAREAN SECTION     x [id number]   UMBILICAL HERNIA REPAIR     2004    Review of Systems:    All systems reviewed and negative except where noted in HPI.    Physical Exam:  There were no vitals taken for this visit. No LMP recorded. Patient has had a hysterectomy.  General: Well-nourished, well-developed in no acute distress.  Lungs: Clear to auscultation bilaterally. Non-labored. Heart: Regular rate and rhythm, no murmurs rubs or gallops.  Abdomen: Bowel sounds are normal; Abdomen is Soft; No hepatosplenomegaly, masses or hernias;  No Abdominal Tenderness; No guarding or rebound tenderness. Neuro: Alert and oriented x 3.  Grossly intact.  Psych: Alert and cooperative, normal mood and affect.   Imaging Studies: No results found.  Labs: CBC    Component Value Date/Time   WBC 4.6 03/30/2024 1829   RBC 4.37 03/30/2024 1829   HGB 13.1 03/30/2024 1829   HGB 13.8 12/26/2020 1053   HGB 9.9 (L) 12/20/2007 1316   HCT 40.9 03/30/2024 1829   HCT 41.3 12/26/2020 1053   HCT 30.8 (L) 12/20/2007 1316   PLT 181 03/30/2024 1829   PLT 191 12/26/2020 1053   MCV 93.6 03/30/2024 1829   MCV 93 12/26/2020 1053   MCV 76 (L) 12/20/2007 1316   MCH 30.0 03/30/2024 1829   MCHC 32.0 03/30/2024 1829   RDW 13.7 03/30/2024 1829   RDW 12.6 12/26/2020 1053   RDW 17.3 (H) 12/20/2007 1316   LYMPHSABS 1.3 02/21/2024 1145   LYMPHSABS 1.5 12/26/2020 1053   LYMPHSABS 1.5 12/20/2007 1316   MONOABS 0.4 02/21/2024 1145   EOSABS 0.0 02/21/2024 1145   EOSABS 0.0 12/26/2020 1053   EOSABS 0.2 12/20/2007 1316    BASOSABS 0.0 02/21/2024 1145   BASOSABS 0.0 12/26/2020 1053   BASOSABS 0.0 12/20/2007 1316    CMP     Component Value Date/Time   NA 136 03/30/2024 1829   NA 137 12/26/2020 1053   K 4.0 03/30/2024 1829   CL 104 03/30/2024 1829   CO2 26 03/30/2024 1829   GLUCOSE 83 03/30/2024 1829   BUN 14 03/30/2024  1829   BUN 9 12/26/2020 1053   CREATININE 0.93 03/30/2024 1829   CREATININE 0.99 12/18/2019 1553   CALCIUM  10.0 03/30/2024 1829   PROT 6.9 02/21/2024 1145   PROT 7.6 12/26/2020 1053   ALBUMIN 3.9 02/21/2024 1145   ALBUMIN 4.5 12/26/2020 1053   AST 22 02/21/2024 1145   ALT 26 02/21/2024 1145   ALKPHOS 83 02/21/2024 1145   BILITOT 0.4 02/21/2024 1145   BILITOT 0.4 12/26/2020 1053   GFRNONAA >60 03/30/2024 1829   GFRAA 82 12/26/2020 1053       Assessment and Plan:   Abigail Campos is a 53 y.o. y/o female ***    Ellouise Console, PA-C  Follow up ***

## 2024-08-09 ENCOUNTER — Encounter: Payer: Self-pay | Admitting: Physician Assistant

## 2024-08-09 ENCOUNTER — Other Ambulatory Visit (HOSPITAL_BASED_OUTPATIENT_CLINIC_OR_DEPARTMENT_OTHER): Payer: Self-pay

## 2024-08-09 ENCOUNTER — Ambulatory Visit (INDEPENDENT_AMBULATORY_CARE_PROVIDER_SITE_OTHER): Payer: PRIVATE HEALTH INSURANCE | Admitting: Physician Assistant

## 2024-08-09 VITALS — BP 128/80 | HR 62 | Ht <= 58 in | Wt 147.0 lb

## 2024-08-09 DIAGNOSIS — K581 Irritable bowel syndrome with constipation: Secondary | ICD-10-CM

## 2024-08-09 DIAGNOSIS — Z800 Family history of malignant neoplasm of digestive organs: Secondary | ICD-10-CM | POA: Diagnosis not present

## 2024-08-09 DIAGNOSIS — K5904 Chronic idiopathic constipation: Secondary | ICD-10-CM

## 2024-08-09 DIAGNOSIS — Z8673 Personal history of transient ischemic attack (TIA), and cerebral infarction without residual deficits: Secondary | ICD-10-CM

## 2024-08-09 MED ORDER — NA SULFATE-K SULFATE-MG SULF 17.5-3.13-1.6 GM/177ML PO SOLN
1.0000 | Freq: Once | ORAL | 0 refills | Status: AC
  Filled 2024-08-09: qty 354, 1d supply, fill #0

## 2024-08-09 NOTE — Patient Instructions (Signed)
 You have been scheduled for a Colonoscopy. Please follow written instructions given to you at your visit today.   If you use inhalers (even only as needed), please bring them with you on the day of your procedure.  DO NOT TAKE 7 DAYS PRIOR TO TEST- Trulicity (dulaglutide) Ozempic , Wegovy  (semaglutide ) Mounjaro  (tirzepatide ) Bydureon Bcise (exanatide extended release)  DO NOT TAKE 1 DAY PRIOR TO YOUR TEST Rybelsus  (semaglutide ) Adlyxin (lixisenatide) Victoza (liraglutide) Byetta (exanatide) ___________________________________________________________________________  Please follow up sooner if symptoms increase or worsen   Due to recent changes in healthcare laws, you may see the results of your imaging and laboratory studies on MyChart before your provider has had a chance to review them.  We understand that in some cases there may be results that are confusing or concerning to you. Not all laboratory results come back in the same time frame and the provider may be waiting for multiple results in order to interpret others.  Please give us  48 hours in order for your provider to thoroughly review all the results before contacting the office for clarification of your results.   Thank you for trusting me with your gastrointestinal care!   Ellouise Console, PA-C _______________________________________________________  If your blood pressure at your visit was 140/90 or greater, please contact your primary care physician to follow up on this.  _______________________________________________________  If you are age 52 or older, your body mass index should be between 23-30. Your Body mass index is 30.72 kg/m. If this is out of the aforementioned range listed, please consider follow up with your Primary Care Provider.  If you are age 67 or younger, your body mass index should be between 19-25. Your Body mass index is 30.72 kg/m. If this is out of the aformentioned range listed, please consider  follow up with your Primary Care Provider.   ________________________________________________________  The [facility] providers would like to encourage you to use MYCHART to communicate with providers for non-urgent requests or questions.  Due to long hold times on the telephone, sending your provider a message by [facility] may be a faster and more efficient way to get a response.  Please allow 48 business hours for a response.  Please remember that this is for non-urgent requests.  _______________________________________________________

## 2024-08-10 ENCOUNTER — Telehealth: Payer: Self-pay

## 2024-08-10 NOTE — Telephone Encounter (Signed)
  Abigail Campos 1971/05/14 [id number]  08/10/24   Dear Jerri Pfeiffer, MD:  We have scheduled the above named patient for a(n) Colonoscopy procedure. Our records show that (s)he is on anticoagulation therapy.  Please advise as to whether the patient may come off their therapy of Plavix  5 days prior to their procedure which is scheduled for 09/25/24.  Please route your response to Alethea Blocker, CMA or fax response to [phone].  Sincerely,    [facility]

## 2024-08-12 NOTE — Progress Notes (Signed)
 Agree with assessment and plan as outlined.

## 2024-08-16 ENCOUNTER — Other Ambulatory Visit (HOSPITAL_BASED_OUTPATIENT_CLINIC_OR_DEPARTMENT_OTHER): Payer: Self-pay

## 2024-08-16 ENCOUNTER — Other Ambulatory Visit: Payer: Self-pay | Admitting: Neurology

## 2024-08-16 MED ORDER — CLOPIDOGREL BISULFATE 75 MG PO TABS
75.0000 mg | ORAL_TABLET | Freq: Every day | ORAL | 0 refills | Status: DC
  Filled 2024-08-16: qty 30, 30d supply, fill #0
  Filled 2024-10-09 – 2024-10-20 (×2): qty 30, 30d supply, fill #1
  Filled 2024-11-29: qty 30, 30d supply, fill #2

## 2024-08-18 ENCOUNTER — Encounter: Payer: Self-pay | Admitting: Audiology

## 2024-08-23 ENCOUNTER — Encounter: Payer: Self-pay | Admitting: Internal Medicine

## 2024-08-23 ENCOUNTER — Ambulatory Visit: Admitting: Internal Medicine

## 2024-08-30 ENCOUNTER — Ambulatory Visit (INDEPENDENT_AMBULATORY_CARE_PROVIDER_SITE_OTHER): Payer: PRIVATE HEALTH INSURANCE | Admitting: Internal Medicine

## 2024-08-30 ENCOUNTER — Encounter: Payer: Self-pay | Admitting: Internal Medicine

## 2024-08-30 VITALS — BP 126/86 | HR 75 | Temp 98.1°F | Resp 16 | Ht <= 58 in | Wt 145.4 lb

## 2024-08-30 DIAGNOSIS — E559 Vitamin D deficiency, unspecified: Secondary | ICD-10-CM | POA: Diagnosis not present

## 2024-08-30 DIAGNOSIS — I10 Essential (primary) hypertension: Secondary | ICD-10-CM | POA: Diagnosis not present

## 2024-08-30 DIAGNOSIS — F322 Major depressive disorder, single episode, severe without psychotic features: Secondary | ICD-10-CM | POA: Diagnosis not present

## 2024-08-30 DIAGNOSIS — Z23 Encounter for immunization: Secondary | ICD-10-CM | POA: Diagnosis not present

## 2024-08-30 MED ORDER — VITAMIN D3 50 MCG (2000 UT) PO CAPS: 2000.0000 [IU] | ORAL_CAPSULE | Freq: Every day | ORAL | Status: AC

## 2024-08-30 NOTE — Patient Instructions (Addendum)
 You had a flu shot today Please get COVID booster at your convenience at your local pharmacy  Continue checking your blood pressure regularly Blood pressure goal:  between 110/65 and  135/85. If it is consistently higher or lower, let me know  Also, take over-the-counter vitamin D : 2000 units every day.   GO TO THE LAB :  Get the blood work   Your results will be posted on MyChart with my comments  Go to the front desk for the checkout Please make an appointment for a physical exam by March 2025

## 2024-08-30 NOTE — Assessment & Plan Note (Signed)
 Prediabetes: A1c has been stable for years. HTN: Reports normal ambulatory BPs, continue carvedilol , losartan , check a BMP and TSH. Depression anxiety insomnia: Controlled on Wellbutrin , Lexapro , trazodone  as needed.  Is quite irritable likely due to menopausal symptoms.  See next Menopausal: Bothersome menopausal symptoms, rec   to reach out to gynecology. Osteoporosis: Reports good compliance with Fosamax  Vitamin D  deficiency: Finished ergocalciferol  50,000 units weekly, recommend to start OTC's. Stroke sequela: Managing well and the left spastic hemiplegia Preventive care: Flu shot today, recommend the COVID-vaccine.  To have a colonoscopy soon. RTC 02/2025 CPE

## 2024-08-30 NOTE — Progress Notes (Signed)
 Subjective:    Patient ID: Abigail Campos, female    DOB: 12-12-1971, 53 y.o.   MRN: [id number]  DOS:  08/30/2024 Type of visit - description: Follow-up  Chronic medical problems addressed. In general feels well except that she think is menopausal: Very emotional, irritable, night sweats.   Review of Systems See above   Past Medical History:  Diagnosis Date   Allergy    Anemia    Anxiety and depression    Arthritis    Back pain    Blood transfusion without reported diagnosis    Depression    DM (diabetes mellitus) (HCC)    Essential hypertension 10/19/2007   03-2010: metoprolol  changed to bystolic  (was not feeling well on it, no specific allergy or reaction)     Food allergy    Hemiplegia (HCC)    Hyperlipemia    Hypertension    Joint pain    Migraine    Neuromuscular disorder (HCC)    left sided hemiplegia - has brace for l leg and walks with a cane   Obesity    Ovarian cancer (HCC)    Pneumonia    Prediabetes    Sleep apnea    not wearing c-pap yet   Stroke (HCC) 2017   Vitamin D  deficiency     Past Surgical History:  Procedure Laterality Date   ABDOMINAL HYSTERECTOMY  11/14/2007   no oophorectomy per surgical report   BREAST BIOPSY Right 02/08/2023   MM RT BREAST BX W LOC DEV 1ST LESION IMAGE BX SPEC STEREO GUIDE 02/08/2023 [facility]MO[facility]   CESAREAN SECTION     x [id number]   UMBILICAL HERNIA REPAIR     2004    Current Outpatient Medications  Medication Instructions   alendronate  (FOSAMAX ) 70 MG tablet Take 1 tablet (70 mg total) by mouth once a week with a full glass of water on an empty stomach.   atorvastatin  (LIPITOR) 20 mg, Oral, Daily   baclofen  (LIORESAL ) 10 mg, Oral, Daily   buPROPion  (WELLBUTRIN  XL) 150 MG 24 hr tablet Take 1 tablet by mouth once daily.   carvedilol  (COREG ) 25 mg, Oral, 2 times daily with meals   clopidogrel  (PLAVIX ) 75 mg, Oral, Daily   escitalopram  (LEXAPRO ) 20 mg, Oral, Daily   linaclotide  (LINZESS ) 290 mcg,  Daily before breakfast   losartan  (COZAAR ) 100 mg, Oral, Daily   pregabalin  (LYRICA ) 50 mg, Oral, 2 times daily   prochlorperazine  (COMPAZINE ) 10 MG tablet Take by mouth.   tiZANidine  (ZANAFLEX ) 4 mg, Oral, 3 times daily   traZODone  (DESYREL ) 100 mg, Oral, At bedtime PRN   Vitamin D3 2,000 Units, Oral, Daily   Zepbound  7.5 mg, Weekly       Objective:   Physical Exam BP 126/86   Pulse 75   Temp 98.1 F (36.7 C)   Resp 16   Ht 4' 10 (1.473 m)   Wt 145 lb 6 oz (65.9 kg)   SpO2 97%   BMI 30.38 kg/m  General:   Well developed, NAD, BMI noted. HEENT:  Normocephalic . Face symmetric, atraumatic Lungs:  CTA B Normal respiratory effort, no intercostal retractions, no accessory muscle use. Heart: RRR,  no murmur.  Lower extremities: no pretibial edema bilaterally  Skin: Not pale. Not jaundice Neurologic:  alert & oriented X3.  Speech normal, gait consistent with left spastic hemiplegia Psych--  Cognition and judgment appear intact.  Cooperative with normal attention span and concentration.  Behavior appropriate. No anxious or depressed appearing.  Assessment   Assessment   Prediabetes: A1c 6.0 (April 2017)  HTN-- HCTZ increased creatinine (07-2023) Hyperlipidemia Depression, insomnia, anxiety GERD Stroke 03-2016: Sequela (L)  spastic hemiplegia   ICH, right basilar ganglia due to HTN emergency, + encephalopathy, + dysphagia, aphasia, dysarthria Residual L spasticity.  CTA neck 03-2016 neg CTA head 10-17 (-) aneurysm Headaches , migraines dx after 2nd pregnancy, (-) CT head 2014 and 2015 OSA: Sleep study 08-2019, saw Dr. Neysa, intolerant to CPAP  Osteoporosis.  T-score 04/2023 -3.2.  Rx Fosamax  Vitamin D  deficiency FH CAD: Myoview  09/2023 (-)  PLAN Prediabetes: A1c has been stable for years. HTN: Reports normal ambulatory BPs, continue carvedilol , losartan , check a BMP and TSH. Depression anxiety insomnia: Controlled on Wellbutrin , Lexapro , trazodone  as needed.   Is quite irritable likely due to menopausal symptoms.  See next Menopausal: Bothersome menopausal symptoms, rec   to reach out to gynecology. Osteoporosis: Reports good compliance with Fosamax  Vitamin D  deficiency: Finished ergocalciferol  50,000 units weekly, recommend to start OTC's. Stroke sequela: Managing well and the left spastic hemiplegia Preventive care: Flu shot today, recommend the COVID-vaccine.  To have a colonoscopy soon. RTC 02/2025 CPE

## 2024-08-31 LAB — BASIC METABOLIC PANEL WITH GFR
BUN: 12 mg/dL (ref 6–23)
CO2: 30 meq/L (ref 19–32)
Calcium: 9.8 mg/dL (ref 8.4–10.5)
Chloride: 100 meq/L (ref 96–112)
Creatinine, Ser: 1 mg/dL (ref 0.40–1.20)
GFR: 65.54 mL/min (ref >60.00)
Glucose, Bld: 68 mg/dL — ABNORMAL LOW (ref 70–99)
Potassium: 4.2 meq/L (ref 3.5–5.1)
Sodium: 138 meq/L (ref 135–145)

## 2024-08-31 LAB — TSH: TSH: 0.81 u[IU]/mL (ref 0.35–5.50)

## 2024-09-01 ENCOUNTER — Encounter: Payer: Self-pay | Admitting: Physical Medicine & Rehabilitation

## 2024-09-01 ENCOUNTER — Encounter
Payer: PRIVATE HEALTH INSURANCE | Attending: Physical Medicine & Rehabilitation | Admitting: Physical Medicine & Rehabilitation

## 2024-09-01 ENCOUNTER — Ambulatory Visit: Payer: Self-pay | Admitting: Internal Medicine

## 2024-09-01 VITALS — BP 113/80 | HR 73 | Ht <= 58 in | Wt 143.6 lb

## 2024-09-01 DIAGNOSIS — G8114 Spastic hemiplegia affecting left nondominant side: Secondary | ICD-10-CM | POA: Insufficient documentation

## 2024-09-01 MED ORDER — PHENOL 5% (AQUEOUS) FOR INJECTION
5.0000 mL | Freq: Once | Status: AC
  Administered 2024-09-01: 5 mL

## 2024-09-01 NOTE — Patient Instructions (Signed)
Tibial nerve block with phenol today. This medication may start taking the fact today however full effect will be at about one week Duration of the effect is 3-6 months Side effects of medication may include right heel numbness or burning. Call if you have burning pain so we can recommend any medication for that. 

## 2024-09-01 NOTE — Progress Notes (Signed)
 Phenol neurolysis of the Left tibial nerve  Indication: Severe spasticity in the plantar flexor muscles which is not responding to medical management and other conservative care and interfering with functional use.  Lot #[id number] Expires 09/02/2024  Informed consent was obtained after describing the risks and benefits of the procedure with the patient this includes bleeding bruising and infection as well as medication side effects. The patient elected to proceed and has given written consent. Patient placed in a prone position on the exam table. External DC stimulation was applied to the popliteal space using a nerve stimulator. Plantar flexion twitch was obtained. The popliteal region was prepped with Betadine and then entered with a 22-gauge 40 mm needle electrode under electrical stimulation guidance. Plantar flexion which was obtained and confirmed. Then 4 cc of 5% phenol were injected. The patient tolerated procedure well. Post procedure instructions and followup visit were given.

## 2024-09-24 ENCOUNTER — Other Ambulatory Visit: Payer: Self-pay | Admitting: Internal Medicine

## 2024-09-24 ENCOUNTER — Other Ambulatory Visit (HOSPITAL_BASED_OUTPATIENT_CLINIC_OR_DEPARTMENT_OTHER): Payer: Self-pay

## 2024-09-24 ENCOUNTER — Other Ambulatory Visit: Payer: Self-pay | Admitting: Physical Medicine & Rehabilitation

## 2024-09-25 ENCOUNTER — Other Ambulatory Visit: Payer: Self-pay

## 2024-09-25 ENCOUNTER — Other Ambulatory Visit (HOSPITAL_BASED_OUTPATIENT_CLINIC_OR_DEPARTMENT_OTHER): Payer: Self-pay

## 2024-09-25 MED ORDER — CARVEDILOL 25 MG PO TABS
25.0000 mg | ORAL_TABLET | Freq: Two times a day (BID) | ORAL | 1 refills | Status: AC
  Filled 2024-09-25: qty 180, 90d supply, fill #0
  Filled 2024-12-31: qty 180, 90d supply, fill #1

## 2024-09-26 ENCOUNTER — Other Ambulatory Visit (HOSPITAL_BASED_OUTPATIENT_CLINIC_OR_DEPARTMENT_OTHER): Payer: Self-pay

## 2024-09-26 MED ORDER — PREGABALIN 50 MG PO CAPS
50.0000 mg | ORAL_CAPSULE | Freq: Two times a day (BID) | ORAL | 1 refills | Status: DC
  Filled 2024-09-26: qty 60, 30d supply, fill #0
  Filled 2024-11-29: qty 60, 30d supply, fill #1

## 2024-09-28 ENCOUNTER — Ambulatory Visit (AMBULATORY_SURGERY_CENTER): Payer: PRIVATE HEALTH INSURANCE | Admitting: Gastroenterology

## 2024-09-28 ENCOUNTER — Encounter: Payer: Self-pay | Admitting: Gastroenterology

## 2024-09-28 VITALS — BP 164/82 | HR 66 | Temp 97.3°F | Resp 16 | Ht <= 58 in | Wt 147.0 lb

## 2024-09-28 DIAGNOSIS — Z1211 Encounter for screening for malignant neoplasm of colon: Secondary | ICD-10-CM | POA: Diagnosis present

## 2024-09-28 DIAGNOSIS — K644 Residual hemorrhoidal skin tags: Secondary | ICD-10-CM

## 2024-09-28 DIAGNOSIS — K6289 Other specified diseases of anus and rectum: Secondary | ICD-10-CM

## 2024-09-28 DIAGNOSIS — Z800 Family history of malignant neoplasm of digestive organs: Secondary | ICD-10-CM

## 2024-09-28 MED ORDER — SODIUM CHLORIDE 0.9 % IV SOLN: 500.0000 mL | Freq: Once | INTRAVENOUS | Status: DC

## 2024-09-28 NOTE — Op Note (Signed)
 [facility] Patient Name: Abigail Campos Procedure Date: 09/28/2024 9:31 AM MRN: [id number] Endoscopist: Elspeth P. Leigh , MD, [id number] Age: 53 Referring MD:  Date of Birth: [birth year] Gender: Female Account #: [id number] Procedure:                Colonoscopy Indications:              Screening in patient at increased risk: Family                            history of 1st-degree relative with colorectal                            cancer (mother dx age 35s) Medicines:                Monitored Anesthesia Care Procedure:                Pre-Anesthesia Assessment:                           - Prior to the procedure, a History and Physical                            was performed, and patient medications and                            allergies were reviewed. The patient's tolerance of                            previous anesthesia was also reviewed. The risks                            and benefits of the procedure and the sedation                            options and risks were discussed with the patient.                            All questions were answered, and informed consent                            was obtained. Prior Anticoagulants: The patient has                            taken Plavix  (clopidogrel ), last dose was 5 days                            prior to procedure. ASA Grade Assessment: III - A                            patient with severe systemic disease. After                            reviewing the risks and benefits, the patient was  deemed in satisfactory condition to undergo the                            procedure.                           After obtaining informed consent, the colonoscope                            was passed under direct vision. Throughout the                            procedure, the patient's blood pressure, pulse, and                            oxygen saturations were monitored continuously. The                             Olympus Scope [id number] was introduced through the                            anus and advanced to the the cecum, identified by                            appendiceal orifice and ileocecal valve. The                            colonoscopy was performed without difficulty. The                            patient tolerated the procedure well. The quality                            of the bowel preparation was adequate. The                            ileocecal valve, appendiceal orifice, and rectum                            were photographed. Scope In: 9:42:37 AM Scope Out: 9:55:45 AM Scope Withdrawal Time: 0 hours 10 minutes 25 seconds  Total Procedure Duration: 0 hours 13 minutes 8 seconds  Findings:                 Skin tags were found on perianal exam.                           Anal papilla(e) were hypertrophied.                           The exam was otherwise without abnormality. Complications:            No immediate complications. Estimated blood loss:                            None. Estimated Blood Loss:  Estimated blood loss: none. Impression:               - Perianal skin tags found on perianal exam.                           - Anal papilla(e) were hypertrophied.                           - The examination was otherwise normal.                           - No polyps. Recommendation:           - Patient has a contact number available for                            emergencies. The signs and symptoms of potential                            delayed complications were discussed with the                            patient. Return to normal activities tomorrow.                            Written discharge instructions were provided to the                            patient.                           - Resume previous diet.                           - Continue present medications.                           - Resume Plavix  today.                            - Repeat colonoscopy in 5 years for screening                            purposes given strong family history of colon                            cancer. Elspeth P. Meredith Kilbride, MD 09/28/2024 9:59:51 AM This report has been signed electronically.

## 2024-09-28 NOTE — Progress Notes (Signed)
 Report to PACU, RN, vss, BBS= Clear.

## 2024-09-28 NOTE — Progress Notes (Signed)
 [facility] History and Physical   Primary Care Physician:  Amon Aloysius BRAVO, MD   Reason for Procedure:   Family history of colon cancer  Plan:    colonoscopy     HPI: Abigail Campos is a 53 y.o. female  here for colonoscopy screening - mother had colon cancer dx age 59s. Last exam 09/2019. Chronic constiaption. Plavix  held for 5 days prior to this exam.   Otherwise feels well without any cardiopulmonary symptoms.   I have discussed risks / benefits of anesthesia and endoscopic procedure with Giovanni CHRISTELLA Kaiser and they wish to proceed with the exams as outlined today.    Past Medical History:  Diagnosis Date   Allergy    Anemia    Anxiety and depression    Arthritis    Back pain    Blood transfusion without reported diagnosis    Depression    DM (diabetes mellitus) (HCC)    Essential hypertension 10/19/2007   03-2010: metoprolol  changed to bystolic  (was not feeling well on it, no specific allergy or reaction)     Food allergy    Hemiplegia (HCC)    Hyperlipemia    Hypertension    Joint pain    Migraine    Neuromuscular disorder (HCC)    left sided hemiplegia - has brace for l leg and walks with a cane   Obesity    Ovarian cancer (HCC)    Pneumonia    Prediabetes    Sleep apnea    not wearing c-pap yet   Stroke [facility]) 2017   Vitamin D  deficiency     Past Surgical History:  Procedure Laterality Date   ABDOMINAL HYSTERECTOMY  11/14/2007   no oophorectomy per surgical report   BREAST BIOPSY Right 02/08/2023   MM RT BREAST BX W LOC DEV 1ST LESION IMAGE BX SPEC STEREO GUIDE 02/08/2023 [facility]   CESAREAN SECTION     x [phone]   UMBILICAL HERNIA REPAIR     2004    Prior to Admission medications   Medication Sig Start Date End Date Taking? Authorizing Provider  atorvastatin  (LIPITOR) 20 MG tablet Take 1 tablet (20 mg total) by mouth daily. 04/19/23  Yes Paz, Aloysius BRAVO, MD  baclofen  (LIORESAL ) 10 MG tablet Take 1 tablet (10 mg total) by  mouth daily. 10/11/23  Yes Kirsteins, Prentice BRAVO, MD  buPROPion  (WELLBUTRIN  XL) 150 MG 24 hr tablet Take 1 tablet (150 mg total) by mouth daily. 07/27/24  Yes   carvedilol  (COREG ) 25 MG tablet Take 1 tablet (25 mg total) by mouth 2 (two) times daily with a meal. 09/25/24  Yes Paz, Aloysius BRAVO, MD  escitalopram  (LEXAPRO ) 20 MG tablet Take 1 tablet (20 mg total) by mouth daily. 06/27/24  Yes Paz, Aloysius BRAVO, MD  losartan  (COZAAR ) 100 MG tablet Take 1 tablet (100 mg total) by mouth daily. 05/17/24  Yes Paz, Aloysius BRAVO, MD  pregabalin  (LYRICA ) 50 MG capsule Take 1 capsule (50 mg total) by mouth 2 (two) times daily. 09/26/24  Yes Kirsteins, Prentice BRAVO, MD  tiZANidine  (ZANAFLEX ) 2 MG tablet Take 2 tablets (4 mg total) by mouth 3 (three) times daily. 09/23/23  Yes Kirsteins, Prentice BRAVO, MD  traZODone  (DESYREL ) 100 MG tablet Take 1 tablet (100 mg total) by mouth at bedtime as needed for sleep. 02/16/24  Yes Paz, Aloysius BRAVO, MD  alendronate  (FOSAMAX ) 70 MG tablet Take 1 tablet (70 mg total) by mouth once a week with a full glass of water  on an empty stomach. 05/06/23     Cholecalciferol  (VITAMIN D3) 50 MCG (2000 UT) capsule Take 1 capsule (2,000 Units total) by mouth daily. 08/30/24   Amon Aloysius BRAVO, MD  clopidogrel  (PLAVIX ) 75 MG tablet Take 1 tablet (75 mg total) by mouth daily. 08/16/24   Rosemarie Eather RAMAN, MD  linaclotide  (LINZESS ) 290 MCG CAPS capsule Take 290 mcg by mouth daily before breakfast.    [provider]  prochlorperazine  (COMPAZINE ) 10 MG tablet Take by mouth. 02/28/23   [provider]  tirzepatide  (ZEPBOUND ) 7.5 MG/0.5ML Pen Inject 7.5 mg into the skin once a week.    [provider]    Current Outpatient Medications  Medication Sig Dispense Refill   atorvastatin  (LIPITOR) 20 MG tablet Take 1 tablet (20 mg total) by mouth daily. 90 tablet 1   baclofen  (LIORESAL ) 10 MG tablet Take 1 tablet (10 mg total) by mouth daily. 30 each 11   buPROPion  (WELLBUTRIN  XL) 150 MG 24 hr tablet Take 1 tablet (150 mg  total) by mouth daily. 30 tablet 2   carvedilol  (COREG ) 25 MG tablet Take 1 tablet (25 mg total) by mouth 2 (two) times daily with a meal. 180 tablet 1   escitalopram  (LEXAPRO ) 20 MG tablet Take 1 tablet (20 mg total) by mouth daily. 90 tablet 1   losartan  (COZAAR ) 100 MG tablet Take 1 tablet (100 mg total) by mouth daily. 90 tablet 1   pregabalin  (LYRICA ) 50 MG capsule Take 1 capsule (50 mg total) by mouth 2 (two) times daily. 60 capsule 1   tiZANidine  (ZANAFLEX ) 2 MG tablet Take 2 tablets (4 mg total) by mouth 3 (three) times daily. 810 tablet 1   traZODone  (DESYREL ) 100 MG tablet Take 1 tablet (100 mg total) by mouth at bedtime as needed for sleep. 90 tablet 1   alendronate  (FOSAMAX ) 70 MG tablet Take 1 tablet (70 mg total) by mouth once a week with a full glass of water on an empty stomach. 4 tablet 11   Cholecalciferol  (VITAMIN D3) 50 MCG (2000 UT) capsule Take 1 capsule (2,000 Units total) by mouth daily.     clopidogrel  (PLAVIX ) 75 MG tablet Take 1 tablet (75 mg total) by mouth daily. 90 tablet 0   linaclotide  (LINZESS ) 290 MCG CAPS capsule Take 290 mcg by mouth daily before breakfast.     prochlorperazine  (COMPAZINE ) 10 MG tablet Take by mouth.     tirzepatide  (ZEPBOUND ) 7.5 MG/0.5ML Pen Inject 7.5 mg into the skin once a week.     Current Facility-Administered Medications  Medication Dose Route Frequency Provider Last Rate Last Admin   0.9 %  sodium chloride  infusion  500 mL Intravenous Once Kyandra Mcclaine, Elspeth SQUIBB, MD       incobotulinumtoxinA  (XEOMIN ) 100 units injection 100 Units  100 Units Intramuscular Once        incobotulinumtoxinA  (XEOMIN ) 100 units injection 200 Units  200 Units Intramuscular Once        PHENOL 5% (AQUEOUS) FOR INJECTION 5 mL  5 mL Injection PRN    5 mL at 10/12/23 1236    Allergies as of 09/28/2024 - Review Complete 09/28/2024  Allergen Reaction Noted   Bee venom Anaphylaxis 09/25/2015   Iopamidol  Hives and Itching 10/07/2016   Peanut-containing drug  products Anaphylaxis 10/19/2007   Aspirin Other (See Comments) 10/19/2007   Latex Hives 10/13/2011   Shingrix  [zoster vac recomb adjuvanted] Other (See Comments) 04/17/2024   Zolpidem  tartrate Other (See Comments) and Hives 03/22/2015  Hydrocodone  Itching 08/17/2012    Family History  Problem Relation Age of Onset   Diabetes Mother    Hypertension Mother    Glaucoma Mother    Colon cancer Mother        mother age 33   Anxiety disorder Mother    Obesity Mother    Rectal cancer Father        dx 61   Hypertension Father    Sleep apnea Father    Alcoholism Father    Obesity Father    Prostate cancer Father    Kidney cancer Father    COPD Father    Heart failure Father    Diabetes Brother    Hypertension Brother    Diabetes Brother    Hypertension Brother    CAD Maternal Grandmother        MI age 33   Stroke Maternal Grandfather    Stroke Paternal Grandfather    Heart disease Maternal Aunt        x 2 did 2012 CAD-CHF   Lung cancer Maternal Aunt    CAD Cousin        cousind w/ CAD   Breast cancer Neg Hx    Esophageal cancer Neg Hx    Stomach cancer Neg Hx     Social History   Socioeconomic History   Marital status: Married    Spouse name: Gordy   Number of children: 2   Years of education: Not on file   Highest education level: Associate degree: occupational, Scientist, product/process development, or vocational program  Occupational History   Occupation: disable d/t stroke TEACHER    Employer: [employer]  Tobacco Use   Smoking status: Never   Smokeless tobacco: Never  Vaping Use   Vaping status: Never Used  Substance and Sexual Activity   Alcohol  use: No   Drug use: No   Sexual activity: Not Currently    Birth control/protection: Surgical  Other Topics Concern   Not on file  Social History Narrative   Lives with husband and son (2007)   Left Hand prior to stroke   Drinks 1-2 cups daily   Daughter (1998), married    Son 2008   Social Drivers of Health   Financial Resource  Strain: Low Risk  (04/25/2024)   Overall Financial Resource Strain (CARDIA)    Difficulty of Paying Living Expenses: Not hard at all  Recent Concern: Financial Resource Strain - Medium Risk (02/16/2024)   Overall Financial Resource Strain (CARDIA)    Difficulty of Paying Living Expenses: Somewhat hard  Food Insecurity: No Food Insecurity (04/25/2024)   Hunger Vital Sign    Worried About Running Out of Food in the Last Year: Never true    Ran Out of Food in the Last Year: Never true  Recent Concern: Food Insecurity - Food Insecurity Present (02/16/2024)   Hunger Vital Sign    Worried About Running Out of Food in the Last Year: Sometimes true    Ran Out of Food in the Last Year: Often true  Transportation Needs: No Transportation Needs (06/20/2024)   PRAPARE - Administrator, Civil Service (Medical): No    Lack of Transportation (Non-Medical): No  Physical Activity: Sufficiently Active (04/25/2024)   Exercise Vital Sign    Days of Exercise per Week: 5 days    Minutes of Exercise per Session: 30 min  Recent Concern: Physical Activity - Insufficiently Active (02/16/2024)   Exercise Vital Sign    Days of Exercise per  Week: 3 days    Minutes of Exercise per Session: 10 min  Stress: Stress Concern Present (06/20/2024)   [employer] of Occupational Health - Occupational Stress Questionnaire    Feeling of Stress: To some extent  Social Connections: Unknown (06/20/2024)   Social Connection and Isolation Panel    Frequency of Communication with Friends and Family: Twice a week    Frequency of Social Gatherings with Friends and Family: Once a week    Attends Religious Services: More than 4 times per year    Active Member of [employer] or Organizations: Yes    Attends Banker Meetings: Not on file    Marital Status: Not on file  Intimate Partner Violence: Not At Risk (04/25/2024)   Humiliation, Afraid, Rape, and Kick questionnaire    Fear of Current or Ex-Partner: No     Emotionally Abused: No    Physically Abused: No    Sexually Abused: No    Review of Systems: All other review of systems negative except as mentioned in the HPI.  Physical Exam: Vital signs BP (!) 147/96   Pulse 70   Temp (!) 97.3 F (36.3 C) (Temporal)   Ht 4' 10 (1.473 m)   Wt 147 lb (66.7 kg)   SpO2 97%   BMI 30.72 kg/m   General:   Alert,  Well-developed, pleasant and cooperative in NAD Lungs:  Clear throughout to auscultation.   Heart:  Regular rate and rhythm Abdomen:  Soft, nontender and nondistended.   Neuro/Psych:  Alert and cooperative. Normal mood and affect. A and O x 3  Marcey Naval, MD [facility]

## 2024-09-28 NOTE — Patient Instructions (Signed)
 Resume previous diet. Continue present medications. Resume Plavix  today. Repeat colonoscopy in 5 years for screening purposes.  YOU HAD AN ENDOSCOPIC PROCEDURE TODAY AT THE [facility]:   Refer to the procedure report that was given to you for any specific questions about what was found during the examination.  If the procedure report does not answer your questions, please call your gastroenterologist to clarify.  If you requested that your care partner not be given the details of your procedure findings, then the procedure report has been included in a sealed envelope for you to review at your convenience later.  YOU SHOULD EXPECT: Some feelings of bloating in the abdomen. Passage of more gas than usual.  Walking can help get rid of the air that was put into your GI tract during the procedure and reduce the bloating. If you had a lower endoscopy (such as a colonoscopy or flexible sigmoidoscopy) you may notice spotting of blood in your stool or on the toilet paper. If you underwent a bowel prep for your procedure, you may not have a normal bowel movement for a few days.  Please Note:  You might notice some irritation and congestion in your nose or some drainage.  This is from the oxygen used during your procedure.  There is no need for concern and it should clear up in a day or so.  SYMPTOMS TO REPORT IMMEDIATELY:  Following lower endoscopy (colonoscopy or flexible sigmoidoscopy):  Excessive amounts of blood in the stool  Significant tenderness or worsening of abdominal pains  Swelling of the abdomen that is new, acute  Fever of 100F or higher  For urgent or emergent issues, a gastroenterologist can be reached at any hour by calling [phone]. Do not use MyChart messaging for urgent concerns.    DIET:  We do recommend a small meal at first, but then you may proceed to your regular diet.  Drink plenty of fluids but you should avoid alcoholic beverages for 24  hours.  ACTIVITY:  You should plan to take it easy for the rest of today and you should NOT DRIVE or use heavy machinery until tomorrow (because of the sedation medicines used during the test).    FOLLOW UP: Our staff will call the number listed on your records the next business day following your procedure.  We will call around 7:15- 8:00 am to check on you and address any questions or concerns that you may have regarding the information given to you following your procedure. If we do not reach you, we will leave a message.     If any biopsies were taken you will be contacted by phone or by letter within the next 1-3 weeks.  Please call us  at [phone] if you have not heard about the biopsies in 3 weeks.    SIGNATURES/CONFIDENTIALITY: You and/or your care partner have signed paperwork which will be entered into your electronic medical record.  These signatures attest to the fact that that the information above on your After Visit Summary has been reviewed and is understood.  Full responsibility of the confidentiality of this discharge information lies with you and/or your care-partner.

## 2024-09-28 NOTE — Progress Notes (Signed)
 I have reviewed the patient's medical history in detail and updated the computerized patient record.

## 2024-09-29 ENCOUNTER — Telehealth: Payer: Self-pay

## 2024-09-29 NOTE — Telephone Encounter (Signed)
 Post procedure follow up call, no answer

## 2024-10-09 ENCOUNTER — Other Ambulatory Visit (HOSPITAL_BASED_OUTPATIENT_CLINIC_OR_DEPARTMENT_OTHER): Payer: Self-pay

## 2024-10-13 ENCOUNTER — Encounter
Payer: PRIVATE HEALTH INSURANCE | Attending: Physical Medicine & Rehabilitation | Admitting: Physical Medicine & Rehabilitation

## 2024-10-13 ENCOUNTER — Encounter: Payer: Self-pay | Admitting: Physical Medicine & Rehabilitation

## 2024-10-13 VITALS — BP 107/72 | HR 84 | Ht <= 58 in | Wt 142.0 lb

## 2024-10-13 DIAGNOSIS — G8114 Spastic hemiplegia affecting left nondominant side: Secondary | ICD-10-CM

## 2024-10-13 MED ORDER — SODIUM CHLORIDE (PF) 0.9 % IJ SOLN
4.0000 mL | Freq: Once | INTRAMUSCULAR | Status: AC
  Administered 2024-10-13: 4 mL

## 2024-10-13 MED ORDER — INCOBOTULINUMTOXINA 100 UNITS IM SOLR
200.0000 [IU] | Freq: Once | INTRAMUSCULAR | Status: AC
  Administered 2024-10-13: 200 [IU] via INTRAMUSCULAR

## 2024-10-13 NOTE — Progress Notes (Signed)
 Xeomin Injection for spasticity using needle EMG guidance  Dilution: 200 Units/ml Indication: Severe spasticity which interferes with ADL,mobility and/or  hygiene and is unresponsive to medication management and other conservative care Informed consent was obtained after describing risks and benefits of the procedure with the patient. This includes bleeding, bruising, infection, excessive weakness, or medication side effects. A REMS form is on file and signed. Needle:  needle electrode Number of units per muscle 200 units total Left FCR 50 units Left FDS 50 units Left FDP 50 units Left FPL 50 units  All injections were done after obtaining appropriate EMG activity and after negative drawback for blood. The patient tolerated the procedure well. Post procedure instructions were given. A followup appointment was made.   Post procedure instructions and followup visit were given.

## 2024-10-19 ENCOUNTER — Other Ambulatory Visit (HOSPITAL_BASED_OUTPATIENT_CLINIC_OR_DEPARTMENT_OTHER): Payer: Self-pay

## 2024-10-20 ENCOUNTER — Other Ambulatory Visit: Payer: Self-pay

## 2024-10-20 ENCOUNTER — Other Ambulatory Visit: Payer: Self-pay | Admitting: Physical Medicine & Rehabilitation

## 2024-10-20 ENCOUNTER — Other Ambulatory Visit (HOSPITAL_BASED_OUTPATIENT_CLINIC_OR_DEPARTMENT_OTHER): Payer: Self-pay

## 2024-10-20 MED ORDER — BACLOFEN 10 MG PO TABS
10.0000 mg | ORAL_TABLET | Freq: Every day | ORAL | 11 refills | Status: AC
  Filled 2024-10-20 (×2): qty 30, 30d supply, fill #0
  Filled 2024-12-31: qty 30, 30d supply, fill #1

## 2024-10-23 ENCOUNTER — Other Ambulatory Visit: Payer: Self-pay

## 2024-10-26 ENCOUNTER — Encounter: Payer: Self-pay | Admitting: Neurology

## 2024-10-26 ENCOUNTER — Ambulatory Visit (INDEPENDENT_AMBULATORY_CARE_PROVIDER_SITE_OTHER): Payer: PRIVATE HEALTH INSURANCE | Admitting: Neurology

## 2024-10-26 VITALS — BP 125/87 | HR 90 | Resp 15 | Ht <= 58 in | Wt 140.4 lb

## 2024-10-26 DIAGNOSIS — G8114 Spastic hemiplegia affecting left nondominant side: Secondary | ICD-10-CM | POA: Diagnosis not present

## 2024-10-26 DIAGNOSIS — Z8673 Personal history of transient ischemic attack (TIA), and cerebral infarction without residual deficits: Secondary | ICD-10-CM

## 2024-10-26 DIAGNOSIS — G3184 Mild cognitive impairment, so stated: Secondary | ICD-10-CM | POA: Diagnosis not present

## 2024-10-26 NOTE — Patient Instructions (Signed)
 I had a long discussion with the patient with regards to her remote intracerebral hemorrhage and spastic left hemiplegia, memory loss and cognitive impairment and discussed plan for evaluation and treatment and answered questions. We also discussed memory compensation strategies and I advised her to increase participation in cognitively challenging activities like solving crossword puzzles, playing bridge and sodoku.    Abigail Campos  Continue Plavix  for stroke prevention and maintain aggressive risk factor modification with strict control of hypertension with blood pressure goal below 130/90, lipids with LDL cholesterol goal below 70 mg percent and diabetes with hemoglobin A1c goal below 6.5%.  She was advised to use a cane for ambulation at all times.  She will return for follow-up in the future only as needed or call earlier if necessary.

## 2024-10-26 NOTE — Progress Notes (Signed)
 [facility] [address] [place]. [place] [address] [phone]       OFFICE FOLLOW UP VISIT NOTE  [name] Date of Birth:  17-May-1971 Medical Record Number:  [id number]   Referring MD:  Aloysius Mech  Reason for Referral: Memory loss  YEP:Pwp[place]jo visit 06/11/2021: Abigail Campos is a pleasant 53 year old African-American lady seen today for consultation visit for memory loss.  History is obtained from the patient and review of electronic medical records and I have personally reviewed available imaging films in PACS.  She has past medical history of hypertension, hyperlipidemia, right basal ganglia intracerebral hemorrhage in April 2017 with residual spastic left hemiplegia, depression, sleep apnea, anemia.  Patient states that she is noticing increasing memory loss and short-term memory difficulties for the last 3 years.  She is to initially managed to write things down and get by but of late this has become difficult.  She is often forgotten the way to drive to her mother's house and has recently given up driving as a result.  She is fairly independent in all activities of daily living and can feed dress and bathe herself.  She does have history of anxiety depression and has noted increased mood swings since her menopause few years.  She is on Wellbutrin  and recently dose was increased to 300 mg daily and she also takes Lexapro .  She denies any headache, seizures, significant head injury with loss of consciousness.  There is no family history of anybody with Alzheimer's or other dementias.  Patient is able to ambulate with a cane she did not fall.  She remains on Plavix  for stroke prevention this complain of bruising but no bleeding.  Blood pressures well controlled today it is 116/76.  Recent lab work for hemoglobin A1c on 05/20/2021 was satisfactory at 5.86 and LDL cholesterol on 12/26/2020 was 61 mg percent.  She was last seen in office for follow-up of on 07/04/2018 and  then lost to follow-up. Update 07/23/2022: She returns for follow-up after last visit more than a year ago.  She states that short-term memory difficulties are about unchanged.  She has learned to be more organized and mentally challenging activities and feels that she is doing all right.  She has not noticed any significant worsening.  She states her blood pressure is under good control and today it is 127/92.  She has had a couple of falls.  In November she fell and fractured her left clavicle which was treated conservatively appears to healing.  She again fell last Tuesday minor fall and did not hurt herself.  She had MRI scan of the brain on 06/30/2021 showed old right basal ganglia hemorrhage.  Mild changes of chronic small vessel disease and no acute abnormalities.  MR angiogram of the brain and neck were both unremarkable.  On 07/10/2021 was normal lab work on 6/29 due to showed normal vitamin B12, TSH, RPR and homocystine levels.  Mental status exam testing today she scored 25/30 which is slightly lower than 27/30 at last visit.  She has no new complaints.  She is living with her husband.  She is able to ambulate with a cane and is mostly independent in activities of daily living. Update 10/28/2023 : She returns for follow-up after last visit more than a year ago.  Patient states she is doing well.  She does do daily puzzles and word searches.  She has not had any cognitive worsening.  She is still independent actives of daily living.  She ambulates with a cane.  She has had no recent falls.  She is tolerating Plavix  well without side effects.  She has had no stroke or TIA symptoms.  She states her blood pressure is under good control.  She is tolerating Lipitor well without side effects.  She had recent lab work in September by her primary care physician which was all satisfactory. Update 10/26/2024 : She returns for follow-up after last visit a year ago.  She states she is doing well.  She has had no  recurrent stroke or TIA symptoms.  She remains on Plavix  which she is tolerating well without bruising or bleeding.  She is tolerating Lipitor well without muscle aches and pains.  Last lab work on 02/21/2024 showed LDL cholesterol to be optimal at 57 mg percent and hemoglobin A1c at 5.6.  She had follow-up carotid ultrasound on 11/19/2023 which showed no significant extracranial stenosis.  She continues to have left hemiparesis and is able to ambulate with a cane using a left foot brace.  She had 1 minor fall in July of this year but otherwise has done well.  She does keep herself busy and solving crossword puzzles and playing Candy crush.  She has no new complaints. ROS:   14 system review of systems is positive for falls, memory loss, forgetfulness, confusion, weakness, gait difficulty, bruising and all other systems negative  PMH:  Past Medical History:  Diagnosis Date   Allergy    Anemia    Anxiety and depression    Arthritis    Back pain    Blood transfusion without reported diagnosis    Depression    DM (diabetes mellitus) (HCC)    Essential hypertension 10/19/2007   03-2010: metoprolol  changed to bystolic  (was not feeling well on it, no specific allergy or reaction)     Food allergy    Hemiplegia (HCC)    Hyperlipemia    Hypertension    Joint pain    Migraine    Neuromuscular disorder (HCC)    left sided hemiplegia - has brace for l leg and walks with a cane   Obesity    Ovarian cancer (HCC)    Pneumonia    Prediabetes    Sleep apnea    not wearing c-pap yet   Stroke [facility]) 2017   Vitamin D  deficiency     Social History:  Social History   Socioeconomic History   Marital status: Married    Spouse name: Gordy   Number of children: 2   Years of education: Not on file   Highest education level: Associate degree: occupational, scientist, product/process development, or vocational program  Occupational History   Occupation: disable d/t stroke TEACHER    Employer: [employer]  Tobacco Use   Smoking  status: Never   Smokeless tobacco: Never  Vaping Use   Vaping status: Never Used  Substance and Sexual Activity   Alcohol  use: No   Drug use: No   Sexual activity: Not Currently    Birth control/protection: Surgical  Other Topics Concern   Not on file  Social History Narrative   Lives with husband and son (2007)   Left Hand prior to stroke   Drinks 1-2 cups daily   Daughter (1998), married    Son 2008   Social Drivers of Health   Financial Resource Strain: Low Risk  (04/25/2024)   Overall Financial Resource Strain (CARDIA)    Difficulty of Paying Living Expenses: Not hard at all  Recent Concern: Financial  Resource Strain - Medium Risk (02/16/2024)   Overall Financial Resource Strain (CARDIA)    Difficulty of Paying Living Expenses: Somewhat hard  Food Insecurity: No Food Insecurity (04/25/2024)   Hunger Vital Sign    Worried About Running Out of Food in the Last Year: Never true    Ran Out of Food in the Last Year: Never true  Recent Concern: Food Insecurity - Food Insecurity Present (02/16/2024)   Hunger Vital Sign    Worried About Running Out of Food in the Last Year: Sometimes true    Ran Out of Food in the Last Year: Often true  Transportation Needs: No Transportation Needs (06/20/2024)   PRAPARE - Administrator, Civil Service (Medical): No    Lack of Transportation (Non-Medical): No  Physical Activity: Sufficiently Active (04/25/2024)   Exercise Vital Sign    Days of Exercise per Week: 5 days    Minutes of Exercise per Session: 30 min  Recent Concern: Physical Activity - Insufficiently Active (02/16/2024)   Exercise Vital Sign    Days of Exercise per Week: 3 days    Minutes of Exercise per Session: 10 min  Stress: Stress Concern Present (06/20/2024)   [employer] of Occupational Health - Occupational Stress Questionnaire    Feeling of Stress: To some extent  Social Connections: Unknown (06/20/2024)   Social Connection and Isolation Panel    Frequency of  Communication with Friends and Family: Twice a week    Frequency of Social Gatherings with Friends and Family: Once a week    Attends Religious Services: More than 4 times per year    Active Member of [employer] or Organizations: Yes    Attends Banker Meetings: Not on file    Marital Status: Not on file  Intimate Partner Violence: Not At Risk (04/25/2024)   Humiliation, Afraid, Rape, and Kick questionnaire    Fear of Current or Ex-Partner: No    Emotionally Abused: No    Physically Abused: No    Sexually Abused: No    Medications:   Current Outpatient Medications on File Prior to Visit  Medication Sig Dispense Refill   alendronate  (FOSAMAX ) 70 MG tablet Take 1 tablet (70 mg total) by mouth once a week with a full glass of water on an empty stomach. 4 tablet 11   atorvastatin  (LIPITOR) 20 MG tablet Take 1 tablet (20 mg total) by mouth daily. 90 tablet 1   baclofen  (LIORESAL ) 10 MG tablet Take 1 tablet (10 mg total) by mouth daily. 30 each 11   buPROPion  (WELLBUTRIN  XL) 150 MG 24 hr tablet Take 1 tablet (150 mg total) by mouth daily. 30 tablet 2   carvedilol  (COREG ) 25 MG tablet Take 1 tablet (25 mg total) by mouth 2 (two) times daily with a meal. 180 tablet 1   Cholecalciferol  (VITAMIN D3) 50 MCG (2000 UT) capsule Take 1 capsule (2,000 Units total) by mouth daily.     clopidogrel  (PLAVIX ) 75 MG tablet Take 1 tablet (75 mg total) by mouth daily. 90 tablet 0   escitalopram  (LEXAPRO ) 20 MG tablet Take 1 tablet (20 mg total) by mouth daily. 90 tablet 1   linaclotide  (LINZESS ) 290 MCG CAPS capsule Take 290 mcg by mouth daily before breakfast.     losartan  (COZAAR ) 100 MG tablet Take 1 tablet (100 mg total) by mouth daily. 90 tablet 1   pregabalin  (LYRICA ) 50 MG capsule Take 1 capsule (50 mg total) by mouth 2 (two) times daily. 60  capsule 1   prochlorperazine  (COMPAZINE ) 10 MG tablet Take by mouth.     tirzepatide  (ZEPBOUND ) 7.5 MG/0.5ML Pen Inject 7.5 mg into the skin once a week.      tiZANidine  (ZANAFLEX ) 2 MG tablet Take 2 tablets (4 mg total) by mouth 3 (three) times daily. 810 tablet 1   traZODone  (DESYREL ) 100 MG tablet Take 1 tablet (100 mg total) by mouth at bedtime as needed for sleep. 90 tablet 1   Current Facility-Administered Medications on File Prior to Visit  Medication Dose Route Frequency Provider Last Rate Last Admin   incobotulinumtoxinA  (XEOMIN ) 100 units injection 100 Units  100 Units Intramuscular Once        incobotulinumtoxinA  (XEOMIN ) 100 units injection 200 Units  200 Units Intramuscular Once        PHENOL 5% (AQUEOUS) FOR INJECTION 5 mL  5 mL Injection PRN    5 mL at 10/12/23 1236    Allergies:   Allergies  Allergen Reactions   Bee Venom Anaphylaxis   Iopamidol  Hives and Itching    Pt broke out with one facial hive.  Had itching on her right upper ant and posterior arm w/o evidence of hives.  Pt given water and 25mg  benadryl  po.  We observed pt for 30 minutes before d/c.  J. Bohm   Peanut-Containing Drug Products Anaphylaxis   Aspirin Other (See Comments)    Reports blisters w/ ASA but pt reports is ok w/ Motrin , Advil , naproxen   Latex Hives   Shingrix  [Zoster Vac Recomb Adjuvanted] Other (See Comments)    Fever   Zolpidem  Tartrate Other (See Comments) and Hives    forgetfulness   Hydrocodone  Itching    Generalized itching w/o rash    Physical Exam General: Mildly obese middle-aged African-American lady, seated, in no evident distress Head: head normocephalic and atraumatic.   Neck: supple with no carotid or supraclavicular bruits Cardiovascular: regular rate and rhythm, no murmurs Musculoskeletal: no deformity Skin:  no rash/petichiae Vascular:  Normal pulses all extremities  Neurologic Exam Mental Status: Awake and fully alert. Oriented to place and time. Recent and remote memory intact. Attention span, concentration and fund of knowledge appropriate. Mood and affect appropriate.  Recall 3/3.  Able to name 13 animals which can walk  on 4 legs.  Clock drawing 4/4.  Mini-Mental status exam score 25/30 ( last visit 28/30 )with deficits in attention and calculation and recall only.   . Cranial Nerves: Fundoscopic exam not done. Pupils equal, briskly reactive to light. Extraocular movements full without nystagmus. Visual fields full to confrontation. Hearing intact. Facial sensation intact. Face, tongue, palate moves normally and symmetrically.  Motor: Spastic left hemiplegia with left upper extremity 0/5 strength in left lower extremity 4/5 strength except left ankle foot drop.  Tone is increased in the left leg mild spasticity.  Normal strength on the right. Sensory.:  Diminished left hemibody touch , pinprick , position and vibratory sensation.  Coordination: Rapid alternating movements normal on the right and cannot be tested appropriately on the left.   Gait and Station: Arises from chair without difficulty. Stance is normal. Gait is spastic ataxic with circumduction and left foot drop and is wearing a left foot brace..  Reflexes: 1+ and symmetric. Toes downgoing.   NIHSS 6 Modified Rankin 3      10/26/2024   12:50 PM 10/27/2023   12:47 PM 07/23/2022    9:28 AM 06/11/2021   11:10 AM  MMSE - Mini Mental State Exam  Orientation  to time 5 5 5 5   Orientation to Place 5 5 5 5   Registration 3 3 3 3   Attention/ Calculation 1 5 2 3   Recall 3 2 2 2   Language- name 2 objects 2 2 2 2   Language- repeat 1 1 1 1   Language- follow 3 step command 3 3 3 3   Language- read & follow direction 1 1 1 1   Write a sentence 1 1 1 1   Copy design 0 0 0 1  Total score 25 28 25 27      ASSESSMENT: 53 year old African-American lady with spastic left hemiplegia secondary to right basal ganglia hypertensive hemorrhage and April 2017 but now complains of subjective memory and cognitive worsening likely due to mild cognitive impairment  which appears stable     PLAN:I had a long discussion with the patient with regards to her remote  intracerebral hemorrhage and spastic left hemiplegia, memory loss and cognitive impairment and discussed plan for evaluation and treatment and answered questions. We also discussed memory compensation strategies and I advised her to increase participation in cognitively challenging activities like solving crossword puzzles, playing bridge and sodoku.    SABRA  Continue Plavix  for stroke prevention and maintain aggressive risk factor modification with strict control of hypertension with blood pressure goal below 130/90, lipids with LDL cholesterol goal below 70 mg percent and diabetes with hemoglobin A1c goal below 6.5%.  She was advised to use a cane for ambulation at all times.  She will return for follow-up in the future only as needed or call earlier if necessary.   I personally spent a total of 35 minutes in the care of the patient today including getting/reviewing separately obtained history, performing a medically appropriate exam/evaluation, counseling and educating, placing orders, referring and communicating with other health care professionals, documenting clinical information in the EHR, independently interpreting results, and coordinating care.       Eather Popp MD  Note: This document was prepared with digital dictation and possible smart phrase technology. Any transcriptional errors that result from this process are unintentional.

## 2024-11-18 ENCOUNTER — Encounter (HOSPITAL_COMMUNITY): Payer: Self-pay

## 2024-11-18 ENCOUNTER — Emergency Department (HOSPITAL_COMMUNITY)
Admission: EM | Admit: 2024-11-18 | Discharge: 2024-11-18 | Disposition: A | Discharge status: Home / Self Care | Attending: Emergency Medicine | Admitting: Emergency Medicine

## 2024-11-18 ENCOUNTER — Emergency Department (HOSPITAL_COMMUNITY)

## 2024-11-18 ENCOUNTER — Other Ambulatory Visit: Payer: Self-pay

## 2024-11-18 DIAGNOSIS — I10 Essential (primary) hypertension: Secondary | ICD-10-CM | POA: Insufficient documentation

## 2024-11-18 DIAGNOSIS — Z8673 Personal history of transient ischemic attack (TIA), and cerebral infarction without residual deficits: Secondary | ICD-10-CM | POA: Insufficient documentation

## 2024-11-18 DIAGNOSIS — E119 Type 2 diabetes mellitus without complications: Secondary | ICD-10-CM | POA: Insufficient documentation

## 2024-11-18 DIAGNOSIS — R079 Chest pain, unspecified: Secondary | ICD-10-CM | POA: Insufficient documentation

## 2024-11-18 DIAGNOSIS — Z8543 Personal history of malignant neoplasm of ovary: Secondary | ICD-10-CM | POA: Insufficient documentation

## 2024-11-18 LAB — BASIC METABOLIC PANEL WITH GFR
Anion gap: 9 (ref 5–15)
BUN: 8 mg/dL (ref 6–20)
CO2: 25 mmol/L (ref 22–32)
Calcium: 8.6 mg/dL — ABNORMAL LOW (ref 8.9–10.3)
Chloride: 101 mmol/L (ref 98–111)
Creatinine, Ser: 0.83 mg/dL (ref 0.44–1.00)
GFR, Estimated: >60 mL/min (ref >60)
Glucose, Bld: 97 mg/dL (ref 70–99)
Potassium: 3.9 mmol/L (ref 3.5–5.1)
Sodium: 135 mmol/L (ref 135–145)

## 2024-11-18 LAB — TROPONIN I (HIGH SENSITIVITY)
Troponin I (High Sensitivity): <2 ng/L (ref <18)
Troponin I (High Sensitivity): <2 ng/L (ref <18)

## 2024-11-18 LAB — CBC
HCT: 39.3 % (ref 36.0–46.0)
Hemoglobin: 12.5 g/dL (ref 12.0–15.0)
MCH: 30.1 pg (ref 26.0–34.0)
MCHC: 31.8 g/dL (ref 30.0–36.0)
MCV: 94.7 fL (ref 80.0–100.0)
Platelets: 180 K/uL (ref 150–400)
RBC: 4.15 MIL/uL (ref 3.87–5.11)
RDW: 13.7 % (ref 11.5–15.5)
WBC: 3.7 K/uL — ABNORMAL LOW (ref 4.0–10.5)
nRBC: 0 % (ref 0.0–0.2)

## 2024-11-18 MED ORDER — ALUM & MAG HYDROXIDE-SIMETH 200-200-20 MG/5ML PO SUSP
30.0000 mL | Freq: Once | ORAL | Status: AC
  Administered 2024-11-18: 30 mL via ORAL
  Filled 2024-11-18: qty 30

## 2024-11-18 NOTE — ED Notes (Signed)
Pt provided two warm blankets.

## 2024-11-18 NOTE — ED Notes (Signed)
 Patient transported to X-ray

## 2024-11-18 NOTE — ED Provider Notes (Addendum)
 [facility]-EMERGENCY DEPT [facility] Emergency Department Provider Note MRN:  [id number]  Arrival date & time: 11/18/24     Chief Complaint   Chest Pain   History of Present Illness   Abigail Campos is a 53 y.o. year-old female with a history of diabetes, stroke presenting to the ED with chief complaint of chest pain.  Chest pain that started Friday morning lasting a few hours and then resolving.  Described as a pressure in the center of the chest.  No associated symptoms.  Pain returned at about 2 AM this morning.  Continues to have pain without any other symptoms, no shortness of breath, no dizziness or diaphoresis, no nausea or vomiting  Review of Systems  A thorough review of systems was obtained and all systems are negative except as noted in the HPI and PMH.   Patient's Health History    Past Medical History:  Diagnosis Date   Allergy    Anemia    Anxiety and depression    Arthritis    Back pain    Blood transfusion without reported diagnosis    Depression    DM (diabetes mellitus) (HCC)    Essential hypertension 10/19/2007   03-2010: metoprolol  changed to bystolic  (was not feeling well on it, no specific allergy or reaction)     Food allergy    Hemiplegia (HCC)    Hyperlipemia    Hypertension    Joint pain    Migraine    Neuromuscular disorder (HCC)    left sided hemiplegia - has brace for l leg and walks with a cane   Obesity    Ovarian cancer (HCC)    Pneumonia    Prediabetes    Sleep apnea    not wearing c-pap yet   Stroke (HCC) 2017   Vitamin D  deficiency     Past Surgical History:  Procedure Laterality Date   ABDOMINAL HYSTERECTOMY  11/14/2007   no oophorectomy per surgical report   BREAST BIOPSY Right 02/08/2023   MM RT BREAST BX W LOC DEV 1ST LESION IMAGE BX SPEC STEREO GUIDE 02/08/2023 [facility]   CESAREAN SECTION     x [phone]   UMBILICAL HERNIA REPAIR     2004    Family History  Problem Relation Age of Onset   Diabetes  Mother    Hypertension Mother    Glaucoma Mother    Colon cancer Mother        mother age 65   Anxiety disorder Mother    Obesity Mother    Rectal cancer Father        dx 37   Hypertension Father    Sleep apnea Father    Alcoholism Father    Obesity Father    Prostate cancer Father    Kidney cancer Father    COPD Father    Heart failure Father    Diabetes Brother    Hypertension Brother    Diabetes Brother    Hypertension Brother    CAD Maternal Grandmother        MI age 66   Stroke Maternal Grandfather    Stroke Paternal Grandfather    Heart disease Maternal Aunt        x 2 did 2012 CAD-CHF   Lung cancer Maternal Aunt    CAD Cousin        cousind w/ CAD   Breast cancer Neg Hx    Esophageal cancer Neg Hx    Stomach cancer Neg Hx  Social History   Socioeconomic History   Marital status: Married    Spouse name: Gordy   Number of children: 2   Years of education: Not on file   Highest education level: Associate degree: occupational, scientist, product/process development, or vocational program  Occupational History   Occupation: disable d/t stroke TEACHER    Employer: [employer]  Tobacco Use   Smoking status: Never   Smokeless tobacco: Never  Vaping Use   Vaping status: Never Used  Substance and Sexual Activity   Alcohol  use: No   Drug use: No   Sexual activity: Not Currently    Birth control/protection: Surgical  Other Topics Concern   Not on file  Social History Narrative   Lives with husband and son (2007)   Left Hand prior to stroke   Drinks 1-2 cups daily   Daughter (1998), married    Son 2008   Social Drivers of Health   Financial Resource Strain: Low Risk  (04/25/2024)   Overall Financial Resource Strain (CARDIA)    Difficulty of Paying Living Expenses: Not hard at all  Recent Concern: Financial Resource Strain - Medium Risk (02/16/2024)   Overall Financial Resource Strain (CARDIA)    Difficulty of Paying Living Expenses: Somewhat hard  Food Insecurity: No Food  Insecurity (04/25/2024)   Hunger Vital Sign    Worried About Running Out of Food in the Last Year: Never true    Ran Out of Food in the Last Year: Never true  Recent Concern: Food Insecurity - Food Insecurity Present (02/16/2024)   Hunger Vital Sign    Worried About Running Out of Food in the Last Year: Sometimes true    Ran Out of Food in the Last Year: Often true  Transportation Needs: No Transportation Needs (06/20/2024)   PRAPARE - Administrator, Civil Service (Medical): No    Lack of Transportation (Non-Medical): No  Physical Activity: Sufficiently Active (04/25/2024)   Exercise Vital Sign    Days of Exercise per Week: 5 days    Minutes of Exercise per Session: 30 min  Recent Concern: Physical Activity - Insufficiently Active (02/16/2024)   Exercise Vital Sign    Days of Exercise per Week: 3 days    Minutes of Exercise per Session: 10 min  Stress: Stress Concern Present (06/20/2024)   [employer] of Occupational Health - Occupational Stress Questionnaire    Feeling of Stress: To some extent  Social Connections: Unknown (06/20/2024)   Social Connection and Isolation Panel    Frequency of Communication with Friends and Family: Twice a week    Frequency of Social Gatherings with Friends and Family: Once a week    Attends Religious Services: More than 4 times per year    Active Member of [employer] or Organizations: Yes    Attends Banker Meetings: Not on file    Marital Status: Not on file  Intimate Partner Violence: Not At Risk (04/25/2024)   Humiliation, Afraid, Rape, and Kick questionnaire    Fear of Current or Ex-Partner: No    Emotionally Abused: No    Physically Abused: No    Sexually Abused: No     Physical Exam   Vitals:   11/18/24 0548 11/18/24 0630  BP: (!) 159/118 (!) 157/103  Pulse: 77 72  Resp: 16   Temp:    SpO2: 98% 98%    CONSTITUTIONAL: Well-appearing, NAD NEURO/PSYCH:  Alert and oriented x 3, no focal deficits EYES:  eyes equal and  reactive ENT/NECK:  no LAD, no JVD CARDIO: Regular rate, well-perfused, normal S1 and S2 PULM:  CTAB no wheezing or rhonchi GI/GU:  non-distended, non-tender MSK/SPINE:  No gross deformities, no edema SKIN:  no rash, atraumatic   *Additional and/or pertinent findings included in MDM below  Diagnostic and Interventional Summary    EKG Interpretation Date/Time:  Saturday November 18 2024 04:19:11 EST Ventricular Rate:  82 PR Interval:  148 QRS Duration:  76 QT Interval:  372 QTC Calculation: 434 R Axis:   11  Text Interpretation: Normal sinus rhythm Cannot rule out Anterior infarct , age undetermined Abnormal ECG When compared with ECG of 30-Mar-2024 18:23, PREVIOUS ECG IS PRESENT Confirmed by Theadore Sharper [phone]) on 11/18/2024 6:19:01 AM       Labs Reviewed  BASIC METABOLIC PANEL WITH GFR - Abnormal; Notable for the following components:      Result Value   Calcium  8.6 (*)    All other components within normal limits  CBC - Abnormal; Notable for the following components:   WBC 3.7 (*)    All other components within normal limits  TROPONIN I (HIGH SENSITIVITY)  TROPONIN I (HIGH SENSITIVITY)    DG Chest 2 View  Final Result      Medications  alum & mag hydroxide-simeth (MAALOX/MYLANTA) 200-200-20 MG/5ML suspension 30 mL (30 mLs Oral Given 11/18/24 9364)     Procedures  /  Critical Care Procedures  ED Course and Medical Decision Making  Initial Impression and Ddx ACS is considered.  Could be GERD or MSK.  Patient does have known history of atherosclerotic disease with a history of stroke.  Reassuring EKG.  Doubt PE, no hypoxia or tachycardia or shortness of breath.  Past medical/surgical history that increases complexity of ED encounter: Stroke  Interpretation of Diagnostics I personally reviewed the EKG and my interpretation is as follows: Sinus rhythm without concerning ischemic findings  No significant blood count or electrolyte disturbance.  Patient  Reassessment and Ultimate Disposition/Management     Troponin negative x 2.  Patient offered admission to the hospital given her heart score of 3 or 4.  She prefers outpatient follow-up.  Overall this seems reasonable given the noncardiac causes favored.  Strict return precautions discussed.  Patient management required discussion with the following services or consulting groups:  None  Complexity of Problems Addressed Acute illness or injury that poses threat of life of bodily function  Additional Data Reviewed and Analyzed Further history obtained from: Prior labs/imaging results  Additional Factors Impacting ED Encounter Risk Consideration of hospitalization  Sharper HERO. Theadore, MD [facility] Emergency Medicine [facility] [email or web address]  Final Clinical Impressions(s) / ED Diagnoses     ICD-10-CM   1. Chest pain, unspecified type  R07.9 Ambulatory referral to Cardiology      ED Discharge Orders          Ordered    Ambulatory referral to Cardiology        11/18/24 0734             Discharge Instructions Discussed with and Provided to Patient:     Discharge Instructions      You were evaluated in the Emergency Department and after careful evaluation, we did not find any emergent condition requiring admission or further testing in the hospital.  Your exam/testing today is overall reassuring.  Recommend close follow-up with cardiology to discuss your symptoms.  Please return to the Emergency Department if you experience any worsening of your condition.   Thank  you for allowing us  to be a part of your care.       Theadore Ozell HERO, MD 11/18/24 0630    Theadore Ozell HERO, MD 11/18/24 [phone]

## 2024-11-18 NOTE — ED Notes (Signed)
Pt was given discharge instructions and verbalized understanding.

## 2024-11-18 NOTE — ED Triage Notes (Signed)
 Pt bib ems from home c/o cp. Initially, pt had intermittent cp midsternum that resolved. Tonight pt was woken up with pressure on right side of chest.   Hx HTN, CVA(Left side deficit)  Pt denies N/V, back  pain, HA, blurred vision, weakness, and/or diaphoresis at this time.   BP 180/110 >>124/72   1 nitroglycerin  en route without relief. Pt allergic to ASA

## 2024-11-18 NOTE — ED Notes (Signed)
 Pt has returned from X-ray.

## 2024-11-18 NOTE — Discharge Instructions (Signed)
 You were evaluated in the Emergency Department and after careful evaluation, we did not find any emergent condition requiring admission or further testing in the hospital.  Your exam/testing today is overall reassuring.  Recommend close follow-up with cardiology to discuss your symptoms.  Please return to the Emergency Department if you experience any worsening of your condition.   Thank you for allowing us  to be a part of your care.

## 2024-11-22 ENCOUNTER — Other Ambulatory Visit (HOSPITAL_BASED_OUTPATIENT_CLINIC_OR_DEPARTMENT_OTHER): Payer: Self-pay

## 2024-11-22 ENCOUNTER — Encounter (HOSPITAL_BASED_OUTPATIENT_CLINIC_OR_DEPARTMENT_OTHER): Payer: Self-pay | Admitting: Cardiology

## 2024-11-22 ENCOUNTER — Ambulatory Visit (INDEPENDENT_AMBULATORY_CARE_PROVIDER_SITE_OTHER): Payer: PRIVATE HEALTH INSURANCE | Admitting: Cardiology

## 2024-11-22 VITALS — BP 120/88 | HR 75 | Ht <= 58 in | Wt 133.0 lb

## 2024-11-22 DIAGNOSIS — I10 Essential (primary) hypertension: Secondary | ICD-10-CM | POA: Diagnosis not present

## 2024-11-22 DIAGNOSIS — R072 Precordial pain: Secondary | ICD-10-CM | POA: Diagnosis not present

## 2024-11-22 DIAGNOSIS — R079 Chest pain, unspecified: Secondary | ICD-10-CM | POA: Diagnosis not present

## 2024-11-22 DIAGNOSIS — Z7189 Other specified counseling: Secondary | ICD-10-CM

## 2024-11-22 DIAGNOSIS — Z8249 Family history of ischemic heart disease and other diseases of the circulatory system: Secondary | ICD-10-CM

## 2024-11-22 DIAGNOSIS — Z8673 Personal history of transient ischemic attack (TIA), and cerebral infarction without residual deficits: Secondary | ICD-10-CM

## 2024-11-22 MED ORDER — PREDNISONE 50 MG PO TABS
ORAL_TABLET | ORAL | 0 refills | Status: AC
  Filled 2024-11-22 – 2024-12-31 (×2): qty 3, 1d supply, fill #0

## 2024-11-22 MED ORDER — DIPHENHYDRAMINE HCL 25 MG PO TABS
ORAL_TABLET | ORAL | 0 refills | Status: AC
  Filled 2024-11-22: qty 24, 24d supply, fill #0
  Filled 2024-12-31: qty 24, 1d supply, fill #0

## 2024-11-22 NOTE — Progress Notes (Signed)
 Cardiology Office Note:  .   Date:  11/22/2024  ID:  Abigail Campos, DOB 04-15-1971, MRN [id number] PCP: Amon Aloysius BRAVO, MD  [facility] Providers Cardiologist:  Shelda Bruckner, MD {  History of Present Illness: Abigail Campos is a 53 y.o. female with PMH prior CVA (ICH) with residual L hemiplegia, type II diabetes, hypertension, hyperlipidemia  Referral from 11/18/24 from Dr. Theadore in the ER reviewed. Noted more than several hours of chest pain. ECG, hsTn unremarkable. Patient declined admission. Referred to cardiology for further evaluation.  Here with her husband today. She has questions about labs, reviewed today. She notes that the day prior, had chest pain that lasted for a short time, then went away. Felt like a central pressure, nothing changed it. She then woke up from sleep with it  around 2AM the next day, central pressure, went away when the EMS team arrived. Since leaving the ER, she has had intermittent chest pressure again, most recently yesterday. Mylanta hasn't helped the discomfort. Not positional, not related to food, not tender. No associated symptoms like nausea, dizziness, shortness of breath.   Notes that on her mother's side, there is a lot of heart disease.   ROS: Denies shortness of breath at rest or with normal exertion. No PND, orthopnea, LE edema or unexpected weight gain. No syncope or palpitations. ROS otherwise negative except as noted.   Studies Reviewed: Abigail    EKG:       Physical Exam:   VS:  BP 120/88   Pulse 75   Ht 4' 10 (1.473 m)   Wt 133 lb (60.3 kg)   SpO2 94%   BMI 27.80 kg/m    Wt Readings from Last 3 Encounters:  11/22/24 133 lb (60.3 kg)  11/18/24 138 lb (62.6 kg)  10/26/24 140 lb 6.4 oz (63.7 kg)    GEN: Well nourished, well developed in no acute distress HEENT: Normal, moist mucous membranes NECK: No JVD CARDIAC: regular rhythm, normal S1 and S2, no rubs or gallops. No murmur. VASCULAR: Radial and DP pulses  2+ bilaterally. No carotid bruits RESPIRATORY:  Clear to auscultation without rales, wheezing or rhonchi  ABDOMEN: Soft, non-tender, non-distended MUSCULOSKELETAL:  Ambulates independently SKIN: Warm and dry, no edema NEUROLOGIC:  Alert and oriented x 3. No focal neuro deficits noted. PSYCHIATRIC:  Normal affect    ASSESSMENT AND PLAN: .    Chest pain Family history of heart disease -stress test 09/2023 normal -ER evaluation with normal hsTn -discussed treadmill stress, nuclear stress/lexiscan , and CT coronary angiography. Discussed pros and cons of each, including but not limited to false positive/false negative risk, radiation risk, and risk of IV contrast dye. Based on shared decision making, decision was made to pursue CT coronary angiography. -counseled on use of sublingual nitroglycerin  and its importance to a good test -history of hives with contrast dye, will premedicate prior to test -reviewed red flag warning signs that need immediate medical attention   History of CVA -noted as intracranial hemorrhage -follows with Dr. Rosemarie, recommended for clopidogrel  long term -on atorvastatin  20 mg daily, last LDL 57 -on GLP  Hypertension -well controlled -continue losartan , carvedilol   CV risk counseling and prevention -recommend heart healthy/Mediterranean diet, with whole grains, fruits, vegetable, fish, lean meats, nuts, and olive oil. Limit salt. -recommend moderate walking, 3-5 times/week for 30-50 minutes each session. Aim for at least 150 minutes/week. Goal should be pace of 3 miles/hours, or walking 1.5 miles in 30 minutes -  recommend avoidance of tobacco products. Avoid excess alcohol .  Dispo: to be determined based on results of testing  Signed, Shelda Bruckner, MD   Shelda Bruckner, MD, PhD, [facility]  [facility]  [place]  [facility] V[facility] at [facility] [address] [place], [place]  [address] [phone]

## 2024-11-22 NOTE — Patient Instructions (Addendum)
 Medication Instructions:  No changes *If you need a refill on your cardiac medications before your next appointment, please call your pharmacy*  Lab Work: none  Testing/Procedures: Coronary CT Angiogram - see instructions below  Follow-Up: Based on results  Other Instructions   Your cardiac CT will be scheduled at one of the below locations:    [facility] [address]  [place], [place] [address]   [facility] at [employer]   please enter the parking lot using the [address] entrance and use the FREE valet service at the patient drop-off area. Enter the building and check-in with registration on the main floor.   Please follow these instructions carefully (unless otherwise directed):  An IV will be required for this test and Nitroglycerin  will be given.   On the Night Before the Test: Be sure to Drink plenty of water. Do not consume any caffeinated/decaffeinated beverages or chocolate 12 hours prior to your test. Do not take any antihistamines 12 hours prior to your test.  If the patient has contrast allergy: Patient will need a prescription for Prednisone  and very clear instructions (as follows): Prednisone  50 mg - take 13 hours prior to test Take another Prednisone  50 mg 7 hours prior to test Take another Prednisone  50 mg 1 hour prior to test Take Benadryl  50 mg 1 hour prior to test Patient must complete all four doses of above prophylactic medications. Patient will need a ride after test due to Benadryl .  On the Day of the Test: Drink plenty of water until 1 hour prior to the test. Do not eat any food 1 hour prior to test. You may take your regular medications prior to the test.  Take your usual dose of carvedilol  two hours prior to test. Patients who wear a continuous glucose monitor MUST remove the device prior to scanning. FEMALES- please wear underwire-free bra if available, avoid dresses & tight  clothing      After the Test: Drink plenty of water. After receiving IV contrast, you may experience a mild flushed feeling. This is normal. On occasion, you may experience a mild rash up to 24 hours after the test. This is not dangerous. If this occurs, you can take Benadryl  25 mg, Zyrtec, Claritin, or Allegra and increase your fluid intake. (Patients taking Tikosyn should avoid Benadryl , and may take Zyrtec, Claritin, or Allegra) If you experience trouble breathing, this can be serious. If it is severe call 911 IMMEDIATELY. If it is mild, please call our office.  We will call to schedule your test 2-4 weeks out understanding that some insurance companies will need an authorization prior to the service being performed.   For more information and frequently asked questions, please visit our website : [email or web address]  For non-scheduling related questions, please contact the cardiac imaging nurse navigator should you have any questions/concerns: Cardiac Imaging Nurse Navigators Direct Office Dial: [phone]   For scheduling needs, including cancellations and rescheduling, please call Brittany, [phone].

## 2024-11-29 ENCOUNTER — Other Ambulatory Visit (HOSPITAL_BASED_OUTPATIENT_CLINIC_OR_DEPARTMENT_OTHER): Payer: Self-pay

## 2024-11-29 ENCOUNTER — Other Ambulatory Visit: Payer: Self-pay

## 2024-11-29 ENCOUNTER — Other Ambulatory Visit: Payer: Self-pay | Admitting: Physical Medicine & Rehabilitation

## 2024-12-04 ENCOUNTER — Other Ambulatory Visit (HOSPITAL_BASED_OUTPATIENT_CLINIC_OR_DEPARTMENT_OTHER): Payer: Self-pay

## 2024-12-04 ENCOUNTER — Encounter: Payer: Self-pay | Admitting: Physical Medicine & Rehabilitation

## 2024-12-08 ENCOUNTER — Encounter: Payer: Self-pay | Admitting: Internal Medicine

## 2024-12-08 ENCOUNTER — Other Ambulatory Visit (HOSPITAL_BASED_OUTPATIENT_CLINIC_OR_DEPARTMENT_OTHER): Payer: Self-pay

## 2024-12-08 ENCOUNTER — Other Ambulatory Visit: Payer: Self-pay

## 2024-12-08 MED ORDER — TIZANIDINE HCL 2 MG PO TABS
4.0000 mg | ORAL_TABLET | Freq: Three times a day (TID) | ORAL | 0 refills | Status: AC
  Filled 2024-12-08: qty 180, 30d supply, fill #0

## 2024-12-20 ENCOUNTER — Other Ambulatory Visit (HOSPITAL_BASED_OUTPATIENT_CLINIC_OR_DEPARTMENT_OTHER): Payer: Self-pay

## 2024-12-20 MED ORDER — AMOXICILLIN 500 MG PO CAPS
500.0000 mg | ORAL_CAPSULE | Freq: Three times a day (TID) | ORAL | 0 refills | Status: AC
  Filled 2024-12-20: qty 21, 7d supply, fill #0

## 2024-12-31 ENCOUNTER — Other Ambulatory Visit: Payer: Self-pay | Admitting: Internal Medicine

## 2024-12-31 ENCOUNTER — Other Ambulatory Visit (HOSPITAL_BASED_OUTPATIENT_CLINIC_OR_DEPARTMENT_OTHER): Payer: Self-pay

## 2025-01-01 ENCOUNTER — Other Ambulatory Visit: Payer: Self-pay

## 2025-01-01 ENCOUNTER — Other Ambulatory Visit (HOSPITAL_BASED_OUTPATIENT_CLINIC_OR_DEPARTMENT_OTHER): Payer: Self-pay

## 2025-01-01 MED ORDER — LOSARTAN POTASSIUM 100 MG PO TABS
100.0000 mg | ORAL_TABLET | Freq: Every day | ORAL | 1 refills | Status: AC
  Filled 2025-01-01: qty 90, 90d supply, fill #0

## 2025-01-01 MED ORDER — TRAZODONE HCL 100 MG PO TABS
100.0000 mg | ORAL_TABLET | Freq: Every evening | ORAL | 1 refills | Status: AC | PRN
  Filled 2025-01-01: qty 90, 90d supply, fill #0

## 2025-01-02 ENCOUNTER — Other Ambulatory Visit (HOSPITAL_BASED_OUTPATIENT_CLINIC_OR_DEPARTMENT_OTHER): Payer: Self-pay | Admitting: Cardiology

## 2025-01-02 ENCOUNTER — Other Ambulatory Visit (HOSPITAL_BASED_OUTPATIENT_CLINIC_OR_DEPARTMENT_OTHER): Payer: Self-pay

## 2025-01-02 ENCOUNTER — Other Ambulatory Visit: Payer: Self-pay | Admitting: Neurology

## 2025-01-02 MED ORDER — BUPROPION HCL ER (XL) 150 MG PO TB24
150.0000 mg | ORAL_TABLET | Freq: Every day | ORAL | 2 refills | Status: AC
  Filled 2025-01-02: qty 30, 30d supply, fill #0

## 2025-01-03 ENCOUNTER — Encounter (HOSPITAL_BASED_OUTPATIENT_CLINIC_OR_DEPARTMENT_OTHER): Payer: Self-pay

## 2025-01-03 ENCOUNTER — Other Ambulatory Visit: Payer: Self-pay

## 2025-01-03 ENCOUNTER — Other Ambulatory Visit (HOSPITAL_BASED_OUTPATIENT_CLINIC_OR_DEPARTMENT_OTHER): Payer: Self-pay

## 2025-01-03 MED ORDER — CLOPIDOGREL BISULFATE 75 MG PO TABS
75.0000 mg | ORAL_TABLET | Freq: Every day | ORAL | 0 refills | Status: AC
  Filled 2025-01-03: qty 30, 30d supply, fill #0

## 2025-01-09 ENCOUNTER — Encounter: Admitting: Physical Medicine & Rehabilitation

## 2025-01-14 ENCOUNTER — Other Ambulatory Visit: Payer: Self-pay | Admitting: Physical Medicine & Rehabilitation

## 2025-01-16 ENCOUNTER — Other Ambulatory Visit (HOSPITAL_BASED_OUTPATIENT_CLINIC_OR_DEPARTMENT_OTHER): Payer: Self-pay

## 2025-01-16 MED ORDER — PREGABALIN 50 MG PO CAPS
50.0000 mg | ORAL_CAPSULE | Freq: Two times a day (BID) | ORAL | 1 refills | Status: AC
  Filled 2025-01-16: qty 60, 30d supply, fill #0

## 2025-01-19 ENCOUNTER — Encounter: Payer: Self-pay | Admitting: Physical Medicine & Rehabilitation

## 2025-01-19 ENCOUNTER — Encounter: Attending: Physical Medicine & Rehabilitation | Admitting: Physical Medicine & Rehabilitation

## 2025-01-19 VITALS — BP 96/68 | HR 67 | Ht <= 58 in | Wt 125.8 lb

## 2025-01-19 DIAGNOSIS — G8114 Spastic hemiplegia affecting left nondominant side: Secondary | ICD-10-CM

## 2025-01-19 MED ORDER — INCOBOTULINUMTOXINA 100 UNITS IM SOLR
200.0000 [IU] | Freq: Once | INTRAMUSCULAR | Status: AC
  Administered 2025-01-19: 200 [IU] via INTRAMUSCULAR

## 2025-01-19 MED ORDER — SODIUM CHLORIDE (PF) 0.9 % IJ SOLN
4.0000 mL | Freq: Once | INTRAMUSCULAR | Status: AC
  Administered 2025-01-19: 4 mL

## 2025-01-19 NOTE — Progress Notes (Signed)
 Xeomin Injection for spasticity using needle EMG guidance  Dilution: 200 Units/ml Indication: Severe spasticity which interferes with ADL,mobility and/or  hygiene and is unresponsive to medication management and other conservative care Informed consent was obtained after describing risks and benefits of the procedure with the patient. This includes bleeding, bruising, infection, excessive weakness, or medication side effects. A REMS form is on file and signed. Needle:  needle electrode Number of units per muscle 200 units total Left FCR 50 units Left FDS 50 units Left FDP 50 units Left FPL 50 units  All injections were done after obtaining appropriate EMG activity and after negative drawback for blood. The patient tolerated the procedure well. Post procedure instructions were given. A followup appointment was made.   Post procedure instructions and followup visit were given.

## 2025-01-19 NOTE — Patient Instructions (Signed)
 You received a Xeomin injection today. You may experience soreness at the needle injection sites. Please call us if any of the injection sites turns red after a couple days or if there is any drainage. You may experience muscle weakness as a result of Xeomin This would improve with time but can take several weeks to improve. The Xeomin should start working in about one week. The Xeomin usually last 3 months. The injection can be repeated every 3 months as needed.

## 2025-02-27 ENCOUNTER — Ambulatory Visit: Payer: PRIVATE HEALTH INSURANCE | Admitting: Internal Medicine

## 2025-03-02 ENCOUNTER — Encounter: Attending: Physical Medicine & Rehabilitation | Admitting: Physical Medicine & Rehabilitation

## 2025-05-03 ENCOUNTER — Ambulatory Visit: Payer: PRIVATE HEALTH INSURANCE
